# Patient Record
Sex: Male | Born: 1960
Health system: Southern US, Community
[De-identification: ages and names within clinical notes are randomized; demographics above are authoritative.]

## PROBLEM LIST (undated history)

## (undated) DIAGNOSIS — D649 Anemia, unspecified: Secondary | ICD-10-CM

## (undated) DIAGNOSIS — Z862 Personal history of diseases of the blood and blood-forming organs and certain disorders involving the immune mechanism: Secondary | ICD-10-CM

## (undated) DIAGNOSIS — G4700 Insomnia, unspecified: Secondary | ICD-10-CM

## (undated) DIAGNOSIS — M7540 Impingement syndrome of unspecified shoulder: Secondary | ICD-10-CM

## (undated) DIAGNOSIS — I2510 Atherosclerotic heart disease of native coronary artery without angina pectoris: Secondary | ICD-10-CM

## (undated) DIAGNOSIS — I639 Cerebral infarction, unspecified: Secondary | ICD-10-CM

## (undated) DIAGNOSIS — M51379 Other intervertebral disc degeneration, lumbosacral region without mention of lumbar back pain or lower extremity pain: Secondary | ICD-10-CM

## (undated) DIAGNOSIS — R011 Cardiac murmur, unspecified: Secondary | ICD-10-CM

## (undated) DIAGNOSIS — R160 Hepatomegaly, not elsewhere classified: Secondary | ICD-10-CM

## (undated) DIAGNOSIS — I358 Other nonrheumatic aortic valve disorders: Secondary | ICD-10-CM

## (undated) DIAGNOSIS — M5137 Other intervertebral disc degeneration, lumbosacral region: Secondary | ICD-10-CM

## (undated) DIAGNOSIS — E876 Hypokalemia: Secondary | ICD-10-CM

## (undated) DIAGNOSIS — C159 Malignant neoplasm of esophagus, unspecified: Secondary | ICD-10-CM

## (undated) DIAGNOSIS — R933 Abnormal findings on diagnostic imaging of other parts of digestive tract: Secondary | ICD-10-CM

## (undated) DIAGNOSIS — J189 Pneumonia, unspecified organism: Secondary | ICD-10-CM

## (undated) DIAGNOSIS — IMO0002 Reserved for concepts with insufficient information to code with codable children: Secondary | ICD-10-CM

## (undated) DIAGNOSIS — I10 Essential (primary) hypertension: Secondary | ICD-10-CM

## (undated) DIAGNOSIS — I469 Cardiac arrest, cause unspecified: Secondary | ICD-10-CM

## (undated) DIAGNOSIS — M25512 Pain in left shoulder: Secondary | ICD-10-CM

## (undated) DIAGNOSIS — L97529 Non-pressure chronic ulcer of other part of left foot with unspecified severity: Secondary | ICD-10-CM

## (undated) DIAGNOSIS — M545 Low back pain: Secondary | ICD-10-CM

## (undated) DIAGNOSIS — K219 Gastro-esophageal reflux disease without esophagitis: Secondary | ICD-10-CM

## (undated) DIAGNOSIS — M75100 Unspecified rotator cuff tear or rupture of unspecified shoulder, not specified as traumatic: Secondary | ICD-10-CM

## (undated) DIAGNOSIS — M329 Systemic lupus erythematosus, unspecified: Secondary | ICD-10-CM

## (undated) DIAGNOSIS — M069 Rheumatoid arthritis, unspecified: Secondary | ICD-10-CM

## (undated) DIAGNOSIS — I2699 Other pulmonary embolism without acute cor pulmonale: Secondary | ICD-10-CM

## (undated) DIAGNOSIS — I739 Peripheral vascular disease, unspecified: Secondary | ICD-10-CM

## (undated) HISTORY — DX: Non-pressure chronic ulcer of other part of left foot with unspecified severity: L97.529

## (undated) HISTORY — DX: Rheumatoid arthritis, unspecified: M06.9

## (undated) HISTORY — DX: Hepatomegaly, not elsewhere classified: R16.0

## (undated) HISTORY — DX: Reserved for concepts with insufficient information to code with codable children: IMO0002

## (undated) HISTORY — PX: ACHILLES TENDON SURGERY: SHX542

## (undated) HISTORY — DX: Abnormal findings on diagnostic imaging of other parts of digestive tract: R93.3

## (undated) HISTORY — DX: Systemic lupus erythematosus, unspecified: M32.9

---
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
## 2000-01-13 ENCOUNTER — Emergency Department (HOSPITAL_COMMUNITY): Admission: EM | Admit: 2000-01-13 | Discharge: 2000-01-13 | Payer: Self-pay | Admitting: Emergency Medicine

## 2000-01-13 ENCOUNTER — Encounter: Payer: Self-pay | Admitting: Emergency Medicine

## 2000-01-19 ENCOUNTER — Emergency Department (HOSPITAL_COMMUNITY): Admission: EM | Admit: 2000-01-19 | Discharge: 2000-01-19 | Payer: Self-pay | Admitting: Emergency Medicine

## 2001-05-04 ENCOUNTER — Encounter: Payer: Self-pay | Admitting: Emergency Medicine

## 2001-05-04 ENCOUNTER — Emergency Department (HOSPITAL_COMMUNITY): Admission: AC | Admit: 2001-05-04 | Discharge: 2001-05-04 | Payer: Self-pay

## 2001-08-06 ENCOUNTER — Emergency Department (HOSPITAL_COMMUNITY): Admission: EM | Admit: 2001-08-06 | Discharge: 2001-08-06 | Payer: Self-pay | Admitting: Emergency Medicine

## 2001-10-02 ENCOUNTER — Emergency Department (HOSPITAL_COMMUNITY): Admission: EM | Admit: 2001-10-02 | Discharge: 2001-10-02 | Payer: Self-pay | Admitting: *Deleted

## 2001-10-02 ENCOUNTER — Encounter: Payer: Self-pay | Admitting: *Deleted

## 2001-12-25 ENCOUNTER — Emergency Department (HOSPITAL_COMMUNITY): Admission: EM | Admit: 2001-12-25 | Discharge: 2001-12-25 | Payer: Self-pay | Admitting: Emergency Medicine

## 2002-04-07 ENCOUNTER — Encounter: Payer: Self-pay | Admitting: Emergency Medicine

## 2002-04-07 ENCOUNTER — Emergency Department (HOSPITAL_COMMUNITY): Admission: EM | Admit: 2002-04-07 | Discharge: 2002-04-07 | Payer: Self-pay | Admitting: Emergency Medicine

## 2002-07-18 ENCOUNTER — Encounter: Payer: Self-pay | Admitting: Orthopedic Surgery

## 2002-07-22 ENCOUNTER — Encounter: Payer: Self-pay | Admitting: Orthopedic Surgery

## 2002-07-22 ENCOUNTER — Inpatient Hospital Stay (HOSPITAL_COMMUNITY): Admission: RE | Admit: 2002-07-22 | Discharge: 2002-07-23 | Payer: Self-pay | Admitting: Orthopedic Surgery

## 2002-07-22 HISTORY — PX: OSTEOTOMY AND ULNAR SHORTENING: SHX2140

## 2003-09-16 ENCOUNTER — Ambulatory Visit (HOSPITAL_COMMUNITY): Admission: RE | Admit: 2003-09-16 | Discharge: 2003-09-16 | Payer: Self-pay | Admitting: Orthopedic Surgery

## 2003-09-17 ENCOUNTER — Encounter: Admission: RE | Admit: 2003-09-17 | Discharge: 2003-09-17 | Payer: Self-pay | Admitting: Orthopedic Surgery

## 2004-09-14 ENCOUNTER — Emergency Department (HOSPITAL_COMMUNITY): Admission: EM | Admit: 2004-09-14 | Discharge: 2004-09-14 | Payer: Self-pay | Admitting: Emergency Medicine

## 2004-09-18 ENCOUNTER — Encounter: Admission: RE | Admit: 2004-09-18 | Discharge: 2004-09-18 | Payer: Self-pay | Admitting: Orthopaedic Surgery

## 2004-10-20 ENCOUNTER — Emergency Department (HOSPITAL_COMMUNITY): Admission: EM | Admit: 2004-10-20 | Discharge: 2004-10-20 | Payer: Self-pay | Admitting: Emergency Medicine

## 2004-10-25 ENCOUNTER — Emergency Department (HOSPITAL_COMMUNITY): Admission: EM | Admit: 2004-10-25 | Discharge: 2004-10-25 | Payer: Self-pay | Admitting: Emergency Medicine

## 2005-01-18 ENCOUNTER — Emergency Department (HOSPITAL_COMMUNITY): Admission: EM | Admit: 2005-01-18 | Discharge: 2005-01-18 | Payer: Self-pay | Admitting: Emergency Medicine

## 2006-09-13 ENCOUNTER — Emergency Department (HOSPITAL_COMMUNITY): Admission: EM | Admit: 2006-09-13 | Discharge: 2006-09-13 | Payer: Self-pay | Admitting: Emergency Medicine

## 2007-02-24 ENCOUNTER — Emergency Department (HOSPITAL_COMMUNITY): Admission: EM | Admit: 2007-02-24 | Discharge: 2007-02-24 | Payer: Self-pay | Admitting: Emergency Medicine

## 2007-06-27 ENCOUNTER — Emergency Department (HOSPITAL_COMMUNITY): Admission: EM | Admit: 2007-06-27 | Discharge: 2007-06-27 | Payer: Self-pay | Admitting: Emergency Medicine

## 2007-12-18 ENCOUNTER — Emergency Department (HOSPITAL_COMMUNITY): Admission: EM | Admit: 2007-12-18 | Discharge: 2007-12-18 | Payer: Self-pay | Admitting: Emergency Medicine

## 2008-01-27 ENCOUNTER — Emergency Department (HOSPITAL_COMMUNITY): Admission: EM | Admit: 2008-01-27 | Discharge: 2008-01-27 | Payer: Self-pay | Admitting: Emergency Medicine

## 2009-07-24 ENCOUNTER — Ambulatory Visit: Payer: Self-pay | Admitting: Internal Medicine

## 2009-07-24 ENCOUNTER — Inpatient Hospital Stay (HOSPITAL_COMMUNITY): Admission: EM | Admit: 2009-07-24 | Discharge: 2009-07-26 | Payer: Self-pay | Admitting: Emergency Medicine

## 2009-07-26 ENCOUNTER — Ambulatory Visit: Payer: Self-pay | Admitting: Vascular Surgery

## 2009-07-26 ENCOUNTER — Encounter: Payer: Self-pay | Admitting: Internal Medicine

## 2009-08-11 ENCOUNTER — Encounter (INDEPENDENT_AMBULATORY_CARE_PROVIDER_SITE_OTHER): Payer: Self-pay | Admitting: Internal Medicine

## 2009-08-11 ENCOUNTER — Ambulatory Visit: Payer: Self-pay | Admitting: Internal Medicine

## 2009-08-11 ENCOUNTER — Encounter: Payer: Self-pay | Admitting: Internal Medicine

## 2009-08-11 DIAGNOSIS — M069 Rheumatoid arthritis, unspecified: Secondary | ICD-10-CM

## 2009-09-23 ENCOUNTER — Emergency Department (HOSPITAL_COMMUNITY): Admission: EM | Admit: 2009-09-23 | Discharge: 2009-09-23 | Payer: Self-pay | Admitting: Emergency Medicine

## 2009-09-27 ENCOUNTER — Telehealth: Payer: Self-pay | Admitting: Internal Medicine

## 2009-10-08 ENCOUNTER — Ambulatory Visit: Payer: Self-pay | Admitting: Internal Medicine

## 2009-10-08 DIAGNOSIS — L84 Corns and callosities: Secondary | ICD-10-CM

## 2009-10-08 DIAGNOSIS — E785 Hyperlipidemia, unspecified: Secondary | ICD-10-CM

## 2009-10-08 LAB — CONVERTED CEMR LAB
ALT: 33 units/L (ref 0–53)
AST: 13 units/L (ref 0–37)
Albumin: 4.3 g/dL (ref 3.5–5.2)
Alkaline Phosphatase: 77 units/L (ref 39–117)
Bilirubin, Direct: 0.1 mg/dL (ref 0.0–0.3)
Cholesterol: 165 mg/dL (ref 0–200)
HDL: 52 mg/dL (ref >39)
Indirect Bilirubin: 0.4 mg/dL (ref 0.0–0.9)
LDL Cholesterol: 86 mg/dL (ref 0–99)
Total Bilirubin: 0.5 mg/dL (ref 0.3–1.2)
Total CHOL/HDL Ratio: 3.2
Total Protein: 6.6 g/dL (ref 6.0–8.3)
Triglycerides: 134 mg/dL (ref <150)
VLDL: 27 mg/dL (ref 0–40)

## 2009-10-18 ENCOUNTER — Ambulatory Visit: Payer: Self-pay | Admitting: Internal Medicine

## 2009-10-18 ENCOUNTER — Ambulatory Visit (HOSPITAL_COMMUNITY): Admission: RE | Admit: 2009-10-18 | Discharge: 2009-10-18 | Payer: Self-pay | Admitting: Internal Medicine

## 2009-10-18 LAB — CONVERTED CEMR LAB
CRP: 2.3 mg/dL — ABNORMAL HIGH (ref <0.6)
Sed Rate: 24 mm/hr — ABNORMAL HIGH (ref 0–16)

## 2009-10-29 ENCOUNTER — Ambulatory Visit: Payer: Self-pay | Admitting: Internal Medicine

## 2009-10-29 DIAGNOSIS — M545 Low back pain, unspecified: Secondary | ICD-10-CM

## 2009-10-29 HISTORY — DX: Low back pain, unspecified: M54.50

## 2009-11-02 ENCOUNTER — Encounter: Payer: Self-pay | Admitting: Internal Medicine

## 2009-11-17 ENCOUNTER — Ambulatory Visit: Payer: Self-pay | Admitting: Internal Medicine

## 2009-11-25 ENCOUNTER — Telehealth: Payer: Self-pay | Admitting: Internal Medicine

## 2009-11-26 ENCOUNTER — Ambulatory Visit: Payer: Self-pay | Admitting: Internal Medicine

## 2009-11-26 ENCOUNTER — Encounter: Payer: Self-pay | Admitting: Internal Medicine

## 2009-12-24 ENCOUNTER — Encounter: Payer: Self-pay | Admitting: Internal Medicine

## 2009-12-24 ENCOUNTER — Telehealth: Payer: Self-pay | Admitting: Internal Medicine

## 2010-01-11 ENCOUNTER — Ambulatory Visit: Payer: Self-pay | Admitting: Internal Medicine

## 2010-01-21 ENCOUNTER — Telehealth: Payer: Self-pay | Admitting: Internal Medicine

## 2010-01-25 ENCOUNTER — Encounter: Payer: Self-pay | Admitting: Internal Medicine

## 2010-02-02 ENCOUNTER — Encounter: Payer: Self-pay | Admitting: Internal Medicine

## 2010-02-15 ENCOUNTER — Telehealth: Payer: Self-pay | Admitting: *Deleted

## 2010-02-15 ENCOUNTER — Emergency Department (HOSPITAL_COMMUNITY): Admission: EM | Admit: 2010-02-15 | Discharge: 2010-02-15 | Payer: Self-pay | Admitting: Emergency Medicine

## 2010-02-16 ENCOUNTER — Ambulatory Visit: Payer: Self-pay | Admitting: Internal Medicine

## 2010-02-16 DIAGNOSIS — R030 Elevated blood-pressure reading, without diagnosis of hypertension: Secondary | ICD-10-CM

## 2010-02-22 ENCOUNTER — Ambulatory Visit: Payer: Self-pay | Admitting: Internal Medicine

## 2010-02-22 ENCOUNTER — Encounter: Payer: Self-pay | Admitting: Internal Medicine

## 2010-02-22 ENCOUNTER — Telehealth: Payer: Self-pay | Admitting: Internal Medicine

## 2010-02-22 DIAGNOSIS — G894 Chronic pain syndrome: Secondary | ICD-10-CM | POA: Insufficient documentation

## 2010-03-01 ENCOUNTER — Encounter: Payer: Self-pay | Admitting: Internal Medicine

## 2010-03-02 LAB — CONVERTED CEMR LAB
Amphetamine Screen, Ur: NEGATIVE
Barbiturate Quant, Ur: NEGATIVE
Benzodiazepines.: POSITIVE — AB
Cocaine Metabolites: NEGATIVE
Creatinine,U: 139.7 mg/dL
Marijuana Metabolite: NEGATIVE
Methadone: NEGATIVE
Opiates: POSITIVE — AB
Phencyclidine (PCP): NEGATIVE
Propoxyphene: NEGATIVE

## 2010-03-24 ENCOUNTER — Telehealth: Payer: Self-pay | Admitting: *Deleted

## 2010-03-25 ENCOUNTER — Encounter: Payer: Self-pay | Admitting: Internal Medicine

## 2010-04-04 ENCOUNTER — Ambulatory Visit: Payer: Self-pay | Admitting: Internal Medicine

## 2010-04-04 ENCOUNTER — Encounter: Payer: Self-pay | Admitting: Internal Medicine

## 2010-04-04 DIAGNOSIS — R740 Nonspecific elevation of levels of transaminase and lactic acid dehydrogenase [LDH]: Secondary | ICD-10-CM

## 2010-04-04 DIAGNOSIS — Z8639 Personal history of other endocrine, nutritional and metabolic disease: Secondary | ICD-10-CM

## 2010-04-04 DIAGNOSIS — Z862 Personal history of diseases of the blood and blood-forming organs and certain disorders involving the immune mechanism: Secondary | ICD-10-CM | POA: Insufficient documentation

## 2010-04-04 LAB — CONVERTED CEMR LAB
ALT: 193 units/L — ABNORMAL HIGH (ref 0–53)
AST: 74 units/L — ABNORMAL HIGH (ref 0–37)
Albumin: 4.6 g/dL (ref 3.5–5.2)
Alkaline Phosphatase: 214 units/L — ABNORMAL HIGH (ref 39–117)
BUN: 12 mg/dL (ref 6–23)
CO2: 26 meq/L (ref 19–32)
Calcium: 10.2 mg/dL (ref 8.4–10.5)
Chloride: 106 meq/L (ref 96–112)
Creatinine, Ser: 0.98 mg/dL (ref 0.40–1.50)
Glucose, Bld: 109 mg/dL — ABNORMAL HIGH (ref 70–99)
HCV Ab: NEGATIVE
Hep A IgM: NEGATIVE
Hep B C IgM: NEGATIVE
Hepatitis B Surface Ag: NEGATIVE
Potassium: 5.1 meq/L (ref 3.5–5.3)
Sodium: 141 meq/L (ref 135–145)
Total Bilirubin: 0.4 mg/dL (ref 0.3–1.2)
Total Protein: 7.6 g/dL (ref 6.0–8.3)

## 2010-04-05 ENCOUNTER — Emergency Department (HOSPITAL_COMMUNITY): Admission: EM | Admit: 2010-04-05 | Discharge: 2010-04-05 | Payer: Self-pay | Admitting: Emergency Medicine

## 2010-04-09 ENCOUNTER — Emergency Department (HOSPITAL_COMMUNITY): Admission: EM | Admit: 2010-04-09 | Discharge: 2010-04-09 | Payer: Self-pay | Admitting: Emergency Medicine

## 2010-04-15 ENCOUNTER — Telehealth: Payer: Self-pay | Admitting: Internal Medicine

## 2010-04-19 ENCOUNTER — Ambulatory Visit: Payer: Self-pay | Admitting: Internal Medicine

## 2010-04-20 ENCOUNTER — Telehealth: Payer: Self-pay | Admitting: Internal Medicine

## 2010-04-20 ENCOUNTER — Encounter: Payer: Self-pay | Admitting: Internal Medicine

## 2010-04-20 LAB — CONVERTED CEMR LAB
ALT: 41 units/L (ref 0–53)
AST: 26 units/L (ref 0–37)
Albumin: 4.1 g/dL (ref 3.5–5.2)
Alkaline Phosphatase: 153 units/L — ABNORMAL HIGH (ref 39–117)
BUN: 7 mg/dL (ref 6–23)
CO2: 22 meq/L (ref 19–32)
Calcium: 9.5 mg/dL (ref 8.4–10.5)
Chloride: 110 meq/L (ref 96–112)
Creatinine, Ser: 0.77 mg/dL (ref 0.40–1.50)
Glucose, Bld: 75 mg/dL (ref 70–99)
Potassium: 3.8 meq/L (ref 3.5–5.3)
Sodium: 142 meq/L (ref 135–145)
Total Bilirubin: 0.3 mg/dL (ref 0.3–1.2)
Total Protein: 6.9 g/dL (ref 6.0–8.3)

## 2010-04-21 ENCOUNTER — Telehealth: Payer: Self-pay | Admitting: Internal Medicine

## 2010-04-22 ENCOUNTER — Telehealth: Payer: Self-pay | Admitting: *Deleted

## 2010-04-25 ENCOUNTER — Telehealth: Payer: Self-pay | Admitting: Internal Medicine

## 2010-05-22 ENCOUNTER — Emergency Department (HOSPITAL_COMMUNITY): Admission: EM | Admit: 2010-05-22 | Discharge: 2010-05-23 | Payer: Self-pay | Admitting: Emergency Medicine

## 2010-05-27 ENCOUNTER — Telehealth: Payer: Self-pay | Admitting: Internal Medicine

## 2010-05-30 ENCOUNTER — Encounter: Payer: Self-pay | Admitting: Internal Medicine

## 2010-06-19 ENCOUNTER — Emergency Department (HOSPITAL_COMMUNITY)
Admission: EM | Admit: 2010-06-19 | Discharge: 2010-06-19 | Payer: Self-pay | Source: Home / Self Care | Admitting: Emergency Medicine

## 2010-06-28 ENCOUNTER — Telehealth: Payer: Self-pay | Admitting: Internal Medicine

## 2010-06-30 ENCOUNTER — Encounter: Payer: Self-pay | Admitting: Internal Medicine

## 2010-07-01 ENCOUNTER — Encounter: Payer: Self-pay | Admitting: Internal Medicine

## 2010-07-17 ENCOUNTER — Encounter: Payer: Self-pay | Admitting: Internal Medicine

## 2010-07-20 ENCOUNTER — Ambulatory Visit: Admission: RE | Admit: 2010-07-20 | Discharge: 2010-07-20 | Payer: Self-pay | Source: Home / Self Care

## 2010-07-28 NOTE — Progress Notes (Signed)
Summary: phone/gg  Phone Note Call from Patient   Caller: Patient Summary of Call: Pt called with c/o left shoulder pain. He can't get any relief with percocet and prendisone.  He can't sleep, did not like the sleeping meds he has.  He has had this pain for years.  THe more he uses the shoulder at work the worse the pain.  Increase pain in last 6 months.  Appointment tomorrow Initial call taken by: Merrie Roof RN,  February 15, 2010 5:22 PM

## 2010-07-28 NOTE — Progress Notes (Signed)
Summary: refill/gg  Phone Note Refill Request  on December 24, 2009 10:31 AM  Refills Requested: Medication #1:  OXYCODONE HCL 10 MG TABS take 1 tablet by mouth every 6 hours as needed for pain   Last Refilled: 11/26/2009 call when ready @ [phone]   Method Requested: Pick up at Office Initial call taken by: Merrie Roof RN,  December 24, 2009 10:32 AM  Follow-up for Phone Call        Approved, pt has not violated the contract, he is eligible for 120 tablets per month and his last refill was on June 3rd, 2011 I am in [facility] hospital today so can we please have [facility] attending sign the perscription for this patient Thank you Breaunna Gottlieb Follow-up by: Mliss Sax MD,  December 24, 2009 11:08 AM  Additional Follow-up for Phone Call Additional follow up Details #1::        Pt informed Rx is ready Additional Follow-up by: Merrie Roof RN,  December 24, 2009 12:02 PM    Prescriptions: OXYCODONE HCL 10 MG TABS (OXYCODONE HCL) take 1 tablet by mouth every 6 hours as needed for pain  #120 x 0   Entered by:   Zoila Shutter MD   Authorized by:   Mliss Sax MD   Signed by:   Zoila Shutter MD on 12/24/2009   Method used:   Print then Give to Patient   RxID:   [id number] OXYCODONE HCL 10 MG TABS (OXYCODONE HCL) take 1 tablet by mouth every 6 hours as needed for pain  #120 x 0   Entered and Authorized by:   Mliss Sax MD   Signed by:   Mliss Sax MD on 12/24/2009   Method used:   Historical   RxID:   [id number]

## 2010-07-28 NOTE — Assessment & Plan Note (Signed)
Summary: L shoulder, states reaction to shot/pcp-magick/hla   Vital Signs:  Patient profile:   50 year old male Height:      71 inches Weight:      167.4 pounds BMI:     23.43 Temp:     98.2 degrees F oral Pulse rate:   104 / minute BP sitting:   125 / 85  (right arm)  Vitals Entered By: Filomena Jungling NT II (February 22, 2010 3:16 PM) CC: FOLLOWUP VISIT FROM SHOULDER INJECTION, WOULD LIKE TO GET PAIN MEDICINE TODAY ?EARLY Is Patient Diabetic? No Pain Assessment Patient in pain? yes     Location: shoulders,left Intensity: 7 Type: aching Onset of pain  Chronic Nutritional Status BMI of 19 -24 = normal  Have you ever been in a relationship where you felt threatened, hurt or afraid?No   Does patient need assistance? Functional Status Self care Ambulation Normal   CC:  FOLLOWUP VISIT FROM SHOULDER INJECTION and WOULD LIKE TO GET PAIN MEDICINE TODAY ?EARLY.  History of Present Illness: Patient is a 50 y/o man with h/o chronic pain related to his left shoulder and back.  He was last seen in the clinic last week and received a cortisone injection in his shoulder.  He was supposed to get MRI of the shoulder yesterday, but didn't go because he thought he was getting sick.  He rescheduled for this Thursday.  Patient noticed that about 7-8 hours after the injection last week he got sweaty and hot (changing shirt every 4-5 hours) continuously until yesterday.  Never had this problem before.  Also, since having the shot he has had increased pain so has used 2 extra percocet a day.  He states that he woke up 3 nights ago with severe left shoulder and left chest pain " like it was frozen up" - never had this before.   Patient has taken 2 extra oxycodone a day since getting the shot. At this point he is set to run out 2 days early.    Pt is a newly-former smoker - he quit smoking in the last 11-12 days - was smoking 1.5-2 ppd.    No fevers, rhinorrhea, cough, abdominal pain, change in bowels. No  weight loss.    Preventive Screening-Counseling & Management  Alcohol-Tobacco     Alcohol drinks/day: 0     Smoking Status: quit     Smoking Cessation Counseling: yes     Packs/Day: 2.0     Pack years: 4-6 A DAY  Caffeine-Diet-Exercise     Does Patient Exercise: no  Current Problems (verified): 1)  Chronic Pain Syndrome  (ICD-338.4) 2)  Elevated Bp Reading Without Dx Hypertension  (ICD-796.2) 3)  Shoulder Pain, Left, Chronic  (ICD-719.41) 4)  Back Pain  (ICD-724.5) 5)  Steroid Use, Long Term  (ICD-V58.65) 6)  Callus, Foot  (ICD-700) 7)  Hyperlipidemia  (ICD-272.4) 8)  ? of Vertigo  (ICD-780.4) 9)  Tobacco Abuse  (ICD-305.1) 10)  ? of Tia  (ICD-435.9) 11)  Rheumatoid Arthritis  (ICD-714.0) 12)  Systemic Lupus Erythematosus  (ICD-710.0)  Current Medications (verified): 1)  Humira 40 Mg/0.79ml Kit (Adalimumab) .... Inject 40 Mg Subcutaneously Every Other Week On Monday 2)  Methotrexate 2.5 Mg Tabs (Methotrexate Sodium) .... Take Ten Tablets By Mouth Every Monday. 3)  Prednisone 20 Mg Tabs (Prednisone) .... Take 1 Tablet By Mouth Once A Day 4)  Aspirin 81 Mg Tbec (Aspirin) .... Take 1 Tablet By Mouth Once A Day 5)  Zocor 20 Mg  Tabs (Simvastatin) .... Take 1 Tablet Once Daily At Bedtime 6)  Oscal 500/200 D-3 500-200 Mg-Unit Tabs (Calcium-Vitamin D) .... Take 1 Tablet Twice Daily 7)  Morphine Sulfate Cr 15 Mg Xr12h-Tab (Morphine Sulfate) .... Take 1 Tablet By Mouth Twice Daily 8)  Oxycodone-Acetaminophen 5-325 Mg Tabs (Oxycodone-Acetaminophen) .... Take 1 Tablet By Mouth Every 6 Hours As Needed For Severe Pain  Allergies (verified): No Known Drug Allergies  Past History:  Family History: Last updated: 08/11/2009 no significant medical family history  Social History: Last updated: 11/17/2009 Single Alcohol use-no Drug use-no Regular exercise-yes  Risk Factors: Alcohol Use: 0 (02/22/2010) Exercise: no (02/22/2010)  Risk Factors: Smoking Status: quit  (02/22/2010) Packs/Day: 2.0 (02/22/2010)  Past medical history reviewed for relevance to current acute and chronic problems.  Past Medical History: Reviewed history from 11/26/2009 and no changes required. Lupus RA chronic pain  Review of Systems       see HPI  Physical Exam  General:  NAD Lungs:  normal respiratory effort, normal breath sounds, no crackles, and no wheezes.   Heart:  normal rate, regular rhythm, and no murmur.   Abdomen:  soft, non-tender, and normal bowel sounds.   Msk:  full ROM of both shoulders.  left shoulder:  moderately tender to palpation AC joint and lateral border of scapula.  No joint swelling, warmth or redness. Extremities:  no edema Neurologic:  A+Ox3, CN grossly intact, gait wnl.  full, equal strength bilateral upper extremities Psych:  Oriented X3, memory intact for recent and remote, normally interactive, good eye contact, not anxious appearing, and not depressed appearing.     Impression & Recommendations:  Problem # 1:  CHRONIC PAIN SYNDROME (ICD-338.4) Assessment New Patient with chronic pain syndrome with pain in his left shoulder, low back and hips.  Given that he has had this for a long time, we will switch his regimen to a long acting narcotic with breakthrough.  MS Contin 15mg  PO BID #60 and percocet 5/325 PO q6h prn severe pain #120 to be filled starting today.  We had the patient sign a new drug contact and we explained the terms of the contract. Pt voiced understanding.  Also UDS w/ oxycodone level today.   Orders: T-Drug Screen-Urine, (single) [id number]) T- * Misc. Laboratory test [id number])  Problem # 2:  SHOULDER PAIN, LEFT, CHRONIC (ICD-719.41) Assessment: Unchanged Patient is scheduled for MRI in 2 days.  No evidence of tendon rupture or joint infection on exam - pt has full range of motion of his shoulder.  Will continue wth scheduled MRI. At this time do not have good explanation for patient's sweating episodes and increased  pain after the joint injection, but this appears to have resolved.  See problem #1 for pain regimen. The following medications were removed from the medication list:    Oxycodone Hcl 10 Mg Tabs (Oxycodone hcl) .[name]... Take 1 tablet by mouth every 6 hours as needed for pain His updated medication list for this problem includes:    Aspirin 81 Mg Tbec (Aspirin) .[name]... Take 1 tablet by mouth once a day    Morphine Sulfate Cr 15 Mg Xr12h-tab (Morphine sulfate) .[name]... Take 1 tablet by mouth twice daily    Oxycodone-acetaminophen 5-325 Mg Tabs (Oxycodone-acetaminophen) .[name]... Take 1 tablet by mouth every 6 hours as needed for severe pain  Complete Medication List: 1)  Humira 40 Mg/0.54ml Kit (Adalimumab) .... Inject 40 mg subcutaneously every other week on monday 2)  Methotrexate 2.5 Mg Tabs (Methotrexate sodium) .Marland KitchenMarland KitchenMarland Kitchen  Take ten tablets by mouth every monday. 3)  Prednisone 20 Mg Tabs (Prednisone) .... Take 1 tablet by mouth once a day 4)  Aspirin 81 Mg Tbec (Aspirin) .... Take 1 tablet by mouth once a day 5)  Zocor 20 Mg Tabs (Simvastatin) .... Take 1 tablet once daily at bedtime 6)  Oscal 500/200 D-3 500-200 Mg-unit Tabs (Calcium-vitamin d) .... Take 1 tablet twice daily 7)  Morphine Sulfate Cr 15 Mg Xr12h-tab (Morphine sulfate) .... Take 1 tablet by mouth twice daily 8)  Oxycodone-acetaminophen 5-325 Mg Tabs (Oxycodone-acetaminophen) .... Take 1 tablet by mouth every 6 hours as needed for severe pain  Patient Instructions: 1)  You have been given prescriptions for 2 new pain medicines.  One is a long-acting medcine called MS-Contin.  The other is for breakthrough pain. 2)  Please take all of your medicines as directed. 3)  Please keep your regularly scheduled appointment with Dr. Aldine Contes Prescriptions: ZOCOR 20 MG TABS (SIMVASTATIN) take 1 tablet once daily at bedtime  #30 x 5   Entered and Authorized by:   Danelle Berry, MD   Signed by:   Danelle Berry, MD on 02/22/2010   Method used:   Print then  Give to Patient   RxID:   [id number] OXYCODONE-ACETAMINOPHEN 5-325 MG TABS (OXYCODONE-ACETAMINOPHEN) Take 1 tablet by mouth every 6 hours as needed for severe pain  #120 x 0   Entered and Authorized by:   Danelle Berry, MD   Signed by:   Danelle Berry, MD on 02/22/2010   Method used:   Print then Give to Patient   RxID:   [id number] MORPHINE SULFATE CR 15 MG XR12H-TAB (MORPHINE SULFATE) Take 1 tablet by mouth twice daily  #60 x 0   Entered and Authorized by:   Danelle Berry, MD   Signed by:   Danelle Berry, MD on 02/22/2010   Method used:   Print then Give to Patient   RxID:   [id number]   Prevention & Chronic Care Immunizations   Influenza vaccine: Not documented   Influenza vaccine deferral: Not indicated  (10/08/2009)    Tetanus booster: 11/17/2009: Td   Td booster deferral: Not indicated  (10/08/2009)    Pneumococcal vaccine: Not documented  Other Screening   Smoking status: quit  (02/22/2010)  Lipids   Total Cholesterol: 165  (10/08/2009)   LDL: 86  (10/08/2009)   LDL Direct: Not documented   HDL: 52  (10/08/2009)   Triglycerides: 134  (10/08/2009)    SGOT (AST): 13  (10/08/2009)   BMP action: Ordered   SGPT (ALT): 33  (10/08/2009)   Alkaline phosphatase: 77  (10/08/2009)   Total bilirubin: 0.5  (10/08/2009)  Self-Management Support :   Personal Goals (by the next clinic visit) :      Personal LDL goal: 100  (10/18/2009)    Patient will work on the following items until the next clinic visit to reach self-care goals:     Medications and monitoring: take my medicines every day  (02/22/2010)     Eating: drink diet soda or water instead of juice or soda, eat more vegetables, use fresh or frozen vegetables, eat foods that are low in salt, eat baked foods instead of fried foods  (02/22/2010)     Activity: take a 30 minute walk every day  (02/22/2010)    Lipid self-management support: Education handout  (02/22/2010)     Lipid education handout  printed   Process Orders Check Orders Results:     [facility]: Order checked:     [id number] -- T- * Misc. Laboratory test -- No ABN rules found (CPT: ) Tests Sent for requisitioning (February 22, 2010 5:01 PM):     02/22/2010: [facility] -- T-Drug Screen-Urine, (single) [[id number]] (signed)     02/22/2010: [facility] -- T- * Misc. Laboratory test [phone] (signed)     Process Orders Check Orders Results:     [facility]: Order checked:     [id number] -- T- * Misc. Laboratory test -- No ABN rules found (CPT: ) Tests Sent for requisitioning (February 22, 2010 5:01 PM):     02/22/2010: [facility] -- T-Drug Screen-Urine, (single) [[id number]] (signed)     02/22/2010: [facility] -- T- * Misc. Laboratory test [phone] (signed)    Appended Document: L shoulder, states reaction to shot/pcp-magick/hla Mr. Bialas was seen and examined with Dr. Claudette Laws.  The assessment and plan were formulated together.  My left shoulder examination was also unremarkable for brusing, erythema, warmth, or weakness.  I agree with the change in his regimen to one that is more geared to management of chronic pain.  Hence, we will start MS Contin.  Will also decrease the oxycodone to 5 mg and add back the acetaminophen.  Will follow-up on the UDS result.

## 2010-07-28 NOTE — Miscellaneous (Signed)
Summary: MEDICATION CONTRACT  MEDICATION CONTRACT   Imported By: Louretta Parma 03/02/2010 16:50:01  _____________________________________________________________________  External Attachment:    Type:   Image     Comment:   External Document

## 2010-07-28 NOTE — Assessment & Plan Note (Signed)
Summary: est-ck/fu/meds/cfb   Vital Signs:  Patient profile:   50 year old male Height:      71 inches (180.34 cm) Weight:      163.2 pounds (74.18 kg) BMI:     22.84 Temp:     97.1 degrees F (36.17 degrees C) oral Pulse rate:   98 / minute BP sitting:   120 / 83  (right arm)  Vitals Entered By: Stanton Kidney Ditzler RN (January 11, 2010 4:24 PM) Is Patient Diabetic? No Pain Assessment Patient in pain? yes     Location: left shoulder Intensity: 6-7 Type: sharp Onset of pain  long time Nutritional Status BMI of 19 -24 = normal Nutritional Status Detail appetite good  Have you ever been in a relationship where you felt threatened, hurt or afraid?denies   Does patient need assistance? Functional Status Self care Ambulation Normal Comments Throat swollen last week - ? pain med. Discuss pain med.   History of Present Illness: 50 yo male with PMH outlined below presents to [facility] for regular follow up appointment. He has no concerns at the time. No recent sicknesses or hospitalizaitons. No episodes of chest pain, SOB, palpitations, no fever or chills. No specific abdominal or urinary concerns. No recent changes in appetite, weight, sleep patterns, mood.    Depression History:      The patient denies a depressed mood most of the day and a diminished interest in his usual daily activities.  The patient denies significant weight loss, significant weight gain, insomnia, hypersomnia, psychomotor agitation, psychomotor retardation, fatigue (loss of energy), feelings of worthlessness (guilt), impaired concentration (indecisiveness), and recurrent thoughts of death or suicide.        The patient denies that he feels like life is not worth living, denies that he wishes that he were dead, and denies that he has thought about ending his life.         Preventive Screening-Counseling & Management  Alcohol-Tobacco     Alcohol drinks/day: 0     Smoking Status: quit     Smoking Cessation Counseling:  yes     Packs/Day: 2.0     Pack years: 4-6 A DAY  Caffeine-Diet-Exercise     Does Patient Exercise: no  Problems Prior to Update: 1)  Back Pain  (ICD-724.5) 2)  Steroid Use, Long Term  (ICD-V58.65) 3)  Callus, Foot  (ICD-700) 4)  Hyperlipidemia  (ICD-272.4) 5)  ? of Vertigo  (ICD-780.4) 6)  Tobacco Abuse  (ICD-305.1) 7)  ? of Tia  (ICD-435.9) 8)  Rheumatoid Arthritis  (ICD-714.0) 9)  Systemic Lupus Erythematosus  (ICD-710.0)  Medications Prior to Update: 1)  Humira 40 Mg/0.62ml Kit (Adalimumab) .... Inject 40 Mg Subcutaneously Every Other Week On Monday 2)  Oxycodone Hcl 10 Mg Tabs (Oxycodone Hcl) .... Take 1 Tablet By Mouth Every 6 Hours As Needed For Pain 3)  Methotrexate 2.5 Mg Tabs (Methotrexate Sodium) .... Take Ten Tablets By Mouth Every Monday. 4)  Prednisone 20 Mg Tabs (Prednisone) .... Take 1 Tablet By Mouth Once A Day 5)  Meclizine Hcl 25 Mg Tabs (Meclizine Hcl) .... Take 1 Tablet By Mouth Every 6 Hours As Needed For Dizziness 6)  Aspirin 81 Mg Tbec (Aspirin) .... Take 1 Tablet By Mouth Once A Day 7)  Zocor 20 Mg Tabs (Simvastatin) .... Take 1 Tablet Once Daily At Bedtime 8)  Oscal 500/200 D-3 500-200 Mg-Unit Tabs (Calcium-Vitamin D) .... Take 1 Tablet Twice Daily 9)  Prednisone 20 Mg Tabs (Prednisone) .... Take  80 Mg Tab Once Daily For 4 Days, Then 60mg  Tab For 4 Days, Then 40 Mg Tab For 4 Days, Then 20mg  Once Daily 10)  Ambien 10 Mg Tabs (Zolpidem Tartrate) .... Take 1 Tab By Mouth At Bedtime  Current Medications (verified): 1)  Humira 40 Mg/0.14ml Kit (Adalimumab) .... Inject 40 Mg Subcutaneously Every Other Week On Monday 2)  Oxycodone Hcl 10 Mg Tabs (Oxycodone Hcl) .... Take 1 Tablet By Mouth Every 6 Hours As Needed For Pain 3)  Methotrexate 2.5 Mg Tabs (Methotrexate Sodium) .... Take Ten Tablets By Mouth Every Monday. 4)  Prednisone 20 Mg Tabs (Prednisone) .... Take 1 Tablet By Mouth Once A Day 5)  Meclizine Hcl 25 Mg Tabs (Meclizine Hcl) .... Take 1 Tablet By Mouth  Every 6 Hours As Needed For Dizziness 6)  Aspirin 81 Mg Tbec (Aspirin) .... Take 1 Tablet By Mouth Once A Day 7)  Zocor 20 Mg Tabs (Simvastatin) .... Take 1 Tablet Once Daily At Bedtime 8)  Oscal 500/200 D-3 500-200 Mg-Unit Tabs (Calcium-Vitamin D) .... Take 1 Tablet Twice Daily 9)  Prednisone 20 Mg Tabs (Prednisone) .... Take 80 Mg Tab Once Daily For 4 Days, Then 60mg  Tab For 4 Days, Then 40 Mg Tab For 4 Days, Then 20mg  Once Daily 10)  Ambien 10 Mg Tabs (Zolpidem Tartrate) .... Take 1 Tab By Mouth At Bedtime  Allergies (verified): No Known Drug Allergies  Past History:  Past Medical History: Last updated: 11/26/2009 Lupus RA chronic pain  Family History: Last updated: 08/11/2009 no significant medical family history  Social History: Last updated: 11/17/2009 Single Alcohol use-no Drug use-no Regular exercise-yes  Risk Factors: Alcohol Use: 0 (01/11/2010) Exercise: no (01/11/2010)  Risk Factors: Smoking Status: quit (01/11/2010) Packs/Day: 2.0 (01/11/2010)  Family History: Reviewed history from 08/11/2009 and no changes required. no significant medical family history  Social History: Reviewed history from 11/17/2009 and no changes required. Single Alcohol use-no Drug use-no Regular exercise-yes Smoking Status:  quit  Review of Systems       per HPI  Physical Exam  General:  Well-developed,well-nourished,in no acute distress; alert,appropriate and cooperative throughout examination Lungs:  normal respiratory effort and no accessory muscle use.   Heart:  Normal rate and regular rhythm. S1 and S2 normal without gallop, murmur, click, rub or other extra sounds. Abdomen:  Bowel sounds positive,abdomen soft and non-tender without masses, organomegaly or hernias noted. Neurologic:  alert & oriented X3 and gait normal.   Psych:  memory intact for recent and remote and normally interactive.     Impression & Recommendations:  Problem # 1:  BACK PAIN  (ICD-724.5) Well controlled, cont the same regimen.  His updated medication list for this problem includes:    Oxycodone Hcl 10 Mg Tabs (Oxycodone hcl) .[name]... Take 1 tablet by mouth every 6 hours as needed for pain    Aspirin 81 Mg Tbec (Aspirin) .[name]... Take 1 tablet by mouth once a day  Discussed use of moist heat or ice, modified activities, medications, and stretching/strengthening exercises. Back care instructions given. To be seen in 2 weeks if no improvement; sooner if worsening of symptoms.   Problem # 2:  HYPERLIPIDEMIA (ICD-272.4) At goal, will cont the same regimen.  His updated medication list for this problem includes:    Zocor 20 Mg Tabs (Simvastatin) .[name]... Take 1 tablet once daily at bedtime  Labs Reviewed: SGOT: 13 (10/08/2009)   SGPT: 33 (10/08/2009)   HDL:52 (10/08/2009)  LDL:86 (10/08/2009)  Chol:165 (10/08/2009)  Trig:134 (  10/08/2009)  Problem # 3:  TOBACCO ABUSE (ICD-305.1)  Encouraged smoking cessation and discussed different methods for smoking cessation.   Problem # 4:  RHEUMATOID ARTHRITIS (ICD-714.0) Will have appointment with Dr. Nickola Major, will follow up on recommendations.   Complete Medication List: 1)  Humira 40 Mg/0.9ml Kit (Adalimumab) .... Inject 40 mg subcutaneously every other week on monday 2)  Oxycodone Hcl 10 Mg Tabs (Oxycodone hcl) .... Take 1 tablet by mouth every 6 hours as needed for pain 3)  Methotrexate 2.5 Mg Tabs (Methotrexate sodium) .... Take ten tablets by mouth every monday. 4)  Prednisone 20 Mg Tabs (Prednisone) .... Take 1 tablet by mouth once a day 5)  Meclizine Hcl 25 Mg Tabs (Meclizine hcl) .... Take 1 tablet by mouth every 6 hours as needed for dizziness 6)  Aspirin 81 Mg Tbec (Aspirin) .... Take 1 tablet by mouth once a day 7)  Zocor 20 Mg Tabs (Simvastatin) .... Take 1 tablet once daily at bedtime 8)  Oscal 500/200 D-3 500-200 Mg-unit Tabs (Calcium-vitamin d) .... Take 1 tablet twice daily 9)  Prednisone 20 Mg Tabs (Prednisone)  .... Take 80 mg tab once daily for 4 days, then 60mg  tab for 4 days, then 40 mg tab for 4 days, then 20mg  once daily 10)  Ambien 10 Mg Tabs (Zolpidem tartrate) .... Take 1 tab by mouth at bedtime  Patient Instructions: 1)  Please schedule a follow-up appointment in 3 months.   Prevention & Chronic Care Immunizations   Influenza vaccine: Not documented   Influenza vaccine deferral: Not indicated  (10/08/2009)    Tetanus booster: 11/17/2009: Td   Td booster deferral: Not indicated  (10/08/2009)    Pneumococcal vaccine: Not documented  Other Screening   Smoking status: quit  (01/11/2010)  Lipids   Total Cholesterol: 165  (10/08/2009)   LDL: 86  (10/08/2009)   LDL Direct: Not documented   HDL: 52  (10/08/2009)   Triglycerides: 134  (10/08/2009)    SGOT (AST): 13  (10/08/2009)   BMP action: Ordered   SGPT (ALT): 33  (10/08/2009)   Alkaline phosphatase: 77  (10/08/2009)   Total bilirubin: 0.5  (10/08/2009)    Lipid flowsheet reviewed?: Yes   Progress toward LDL goal: At goal  Self-Management Support :   Personal Goals (by the next clinic visit) :      Personal LDL goal: 100  (10/18/2009)    Patient will work on the following items until the next clinic visit to reach self-care goals:     Medications and monitoring: take my medicines every day, bring all of my medications to every visit, weigh myself weekly  (01/11/2010)     Eating: drink diet soda or water instead of juice or soda, eat more vegetables, use fresh or frozen vegetables, eat baked foods instead of fried foods, eat fruit for snacks and desserts, limit or avoid alcohol  (01/11/2010)     Activity: take a 30 minute walk every day  (01/11/2010)    Lipid self-management support: Written self-care plan, Education handout, Resources for patients handout  (01/11/2010)   Lipid self-care plan printed.   Lipid education handout printed      Resource handout printed.

## 2010-07-28 NOTE — Assessment & Plan Note (Signed)
Summary: BACK PAIN/SB.   Vital Signs:  Patient profile:   50 year old male Height:      71 inches (180.34 cm) Weight:      174.8 pounds (79.45 kg) BMI:     24.47 Temp:     97.9 degrees F (36.61 degrees C) oral Pulse rate:   88 / minute BP sitting:   128 / 86  (left arm)  Vitals Entered By: Stanton Kidney Ditzler RN (July 20, 2010 9:10 AM) Is Patient Diabetic? No Pain Assessment Patient in pain? yes     Location: hips and back Intensity: 7 Type: chronic Onset of pain  long time Nutritional Status BMI of 19 -24 = normal Nutritional Status Detail appetite good  Have you ever been in a relationship where you felt threatened, hurt or afraid?denies   Does patient need assistance? Functional Status Self care Ambulation Normal Comments Discuss pain med. 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   Primary Care Provider:  Mliss Sax MD   History of Present Illness: 50 y/o m with chornic back pain and naroctic dependancy comes to the clinic for pain medication refill  He asks for ocycodone 10mg  which he was getting before.   Depression History:      The patient denies a depressed mood most of the day and a diminished interest in his usual daily activities.         Preventive Screening-Counseling & Management  Alcohol-Tobacco     Alcohol drinks/day: 0     Smoking Status: current     Smoking Cessation Counseling: yes     Packs/Day: 0.5     Pack years: 4-6 A DAY  Caffeine-Diet-Exercise     Does Patient Exercise: yes     Type of exercise: WALKING     Exercise (avg: min/session): 30-60     Times/week: 5-7  Current Medications (verified): 1)  Prednisone 20 Mg Tabs (Prednisone) .... Take 1 Tablet By Mouth Once A Day 2)  Oscal 500/200 D-3 500-200 Mg-Unit Tabs (Calcium-Vitamin D) .... Take 1 Tablet Twice Daily 3)  Ambien 10 Mg Tabs (Zolpidem Tartrate) .... Take 1 Tab By Mouth At Bedtime 4)  Tramadol Hcl 50 Mg Tabs (Tramadol Hcl) .... Take 1 Tablet Every 4-6  Hours As Needed For Pain 5)  Simvastatin 20 Mg Tabs (Simvastatin) .... Take 1 Tablet By Mouth Once A Day 6)  Percocet 5-325 Mg Tabs (Oxycodone-Acetaminophen) .[name].. 1 Tab Every 8 Hrs As Neede For Pain  Allergies (verified): No Known Drug Allergies  Social History: Packs/Day:  0.5  Physical Exam  General:  Gen: VS reveiwed, Alert, well developed, nodistress ENT: mucous membranes pink & moist. No abnormal finds in ear and nose. CVC:S1 S2 , no murmurs, no abnormal heart sounds. Lungs: Clear to auscultation B/L. No wheezes, crackles or other abnormal sounds Abdomen: soft, non distended, no tender. Normal Bowel sounds EXT: no pitting edema, no engorged veins, Pulsations normal  Neuro:alert, oriented *3, cranial nerved 2-12 intact, strenght normal in all  extremities, senstations normal to light touch.      Impression & Recommendations:  Problem # 1:  CHRONIC PAIN SYNDROME (ICD-338.4) Chornic back pain has been on different naroctic pain medications for a long time Had episode of elevate LFTs in setting of methotrexate toxicity at which time his percocet was changed from 10/650 to 5/325. He has been taking 3 tablets of the reduced percocet a day and OTC pain medication as needed. He request to go back on  his previous dose of 10mg  percocets Looking back at the records, I see that he violated his pain contract once when he got morphine and perocets from the ED ( which he refuses)  He was given some more percocets after that from the clinic though. At this time, I am not comfortable to give him higher dose of perocets given his history or give him an early prescription of 5/325 perocet. I discussed with him the option of going to pain clinic where just one doctor can manage his pain medications in a more orgnized way, he seems agreeble to it. Also, discussed this with Dr. Sharman Crate and the attending who seems ok with it. Will refer him to pain clinic and rschedule him for follow up in 1 monht here with  the pcp for his other medical problem.   Orders: Pain Clinic Referral (Pain)  I have a reviewed the previous records including hospital and ED records, radiographs and lab values. Spent more than 35 minutes discussing the importance of medication compliance, managment of chronic diseases and lifestyle changes with the patient.    Complete Medication List: 1)  Prednisone 20 Mg Tabs (Prednisone) .... Take 1 tablet by mouth once a day 2)  Oscal 500/200 D-3 500-200 Mg-unit Tabs (Calcium-vitamin d) .... Take 1 tablet twice daily 3)  Ambien 10 Mg Tabs (Zolpidem tartrate) .... Take 1 tab by mouth at bedtime 4)  Tramadol Hcl 50 Mg Tabs (Tramadol hcl) .... Take 1 tablet every 4-6 hours as needed for pain 5)  Simvastatin 20 Mg Tabs (Simvastatin) .... Take 1 tablet by mouth once a day 6)  Percocet 5-325 Mg Tabs (Oxycodone-acetaminophen) .[name].. 1 tab every 8 hrs as neede for pain  Patient Instructions: 1)  Please schedule a follow-up appointment in 1-2 months with pcp.   Orders Added: 1)  Est. Patient Level IV [14782] 2)  Pain Clinic Referral [Pain]     Prevention & Chronic Care Immunizations   Influenza vaccine: Not documented   Influenza vaccine deferral: Not indicated  (10/08/2009)    Tetanus booster: 11/17/2009: Td   Td booster deferral: Not indicated  (10/08/2009)    Pneumococcal vaccine: Not documented  Colorectal Screening   Hemoccult: Not documented    Colonoscopy: Not documented  Other Screening   PSA: Not documented   Smoking status: current  (07/20/2010)   Smoking cessation counseling: yes  (07/20/2010)  Lipids   Total Cholesterol: 165  (10/08/2009)   LDL: 86  (10/08/2009)   LDL Direct: Not documented   HDL: 52  (10/08/2009)   Triglycerides: 134  (10/08/2009)    SGOT (AST): 26  (04/19/2010)   BMP action: Ordered   SGPT (ALT): 41  (04/19/2010)   Alkaline phosphatase: 153  (04/19/2010)   Total bilirubin: 0.3  (04/19/2010)  Self-Management Support :   Personal  Goals (by the next clinic visit) :      Personal LDL goal: 100  (10/18/2009)    Patient will work on the following items until the next clinic visit to reach self-care goals:     Medications and monitoring: take my medicines every day, bring all of my medications to every visit, weigh myself weekly  (07/20/2010)     Eating: drink diet soda or water instead of juice or soda, eat more vegetables, use fresh or frozen vegetables, eat baked foods instead of fried foods, eat fruit for snacks and desserts  (07/20/2010)     Activity: take a 30 minute walk every day  (07/20/2010)  Lipid self-management support: Written self-care plan, Education handout, Resources for patients handout  (07/20/2010)   Lipid self-care plan printed.   Lipid education handout printed      Resource handout printed.

## 2010-07-28 NOTE — Progress Notes (Signed)
Summary: Appointment  Phone Note Outgoing Call   Call placed by: Angelina Ok RN,  April 22, 2010 10:57 AM Call placed to: Insurer Summary of Call: Call from pt asking to goto Dr. Marylene Land Hawkes-Rheumatology.  Call to r. Hawes office pt had a scheduled appointment cancelled and was rescheduled and then no showed.   Spoke with Dora new pt co-ordinator.  Pt will need to call the office for the appointment himself.  He will also be informed that if he no shows again he will be unable to get an appointment with Dr. Judee Clara.  Call to pt message left on answering machine to call the Clinics concerning an appointment. Angelina Ok RN  April 21, 2010 5:56 PM

## 2010-07-28 NOTE — Progress Notes (Signed)
Summary: phone/gg  Phone Note Refill Request  on April 20, 2010 3:27 PM  Refills Requested: Medication #1:  PERCOCET 5-325 MG TABS 1 tab every 8 hrs as neede for pain.   Last Refilled: 03/25/2010 Pt states Rheumatologist said he should not take tylenol   Method Requested: Pick up at Office Initial call taken by: Merrie Roof RN,  April 20, 2010 3:27 PM  Follow-up for Phone Call        ok for refill,  thanks iskra Follow-up by: Mliss Sax MD,  April 20, 2010 5:05 PM    Prescriptions: PERCOCET 5-325 MG TABS (OXYCODONE-ACETAMINOPHEN) 1 tab every 8 hrs as neede for pain  #90 x 0   Entered and Authorized by:   Mliss Sax MD   Signed by:   Mliss Sax MD on 04/20/2010   Method used:   Print then Give to Patient   RxID:   [id number]   Appended Document: phone/gg Pt.here to pick up Percocet Rx.

## 2010-07-28 NOTE — Medication Information (Signed)
Summary: [facility] QUERY REPORT  [facility] QUERY REPORT   Imported By: Shon Hough 02/02/2010 14:34:43  _____________________________________________________________________  External Attachment:    Type:   Image     Comment:   External Document

## 2010-07-28 NOTE — Miscellaneous (Signed)
Summary: Hosp D/C: TIA vs Vertigo  Hospital Discharge  Date of admission: 07/24/2009  Date of discharge: 07/26/2009  Brief reason for admission/active problems: Patient admitted for hx of slurred speech, unstable gait and dizziness which were resolved by presentation to the ED. He states that he had two episodes during December as well. Neuro consulted from the ED physicians prior to admission and thought it was likely vertigo and not related to a TIA. Concern for TIA given hx of autoimmune disorders and possibility of vasculitis prompted more thorough evaluation. MRI, CT of Head, 2D ECHO and carotid dopplers were negative.  Followup needed: 1. Please evaluate for continued episodes of dizziness and if meclizine has helped if he has continued episodes. 2. He had a lipid panel done with an LDL of 108, will likely benefit from low dose statin.  The medication and problem lists have been updated.  Please see the dictated discharge summary for details.\   Problems: Added new problem of SYSTEMIC LUPUS ERYTHEMATOSUS (ICD-710.0) Added new problem of RHEUMATOID ARTHRITIS (ICD-714.0) Added new problem of Question of  TIA (ICD-435.9) - TIA vs Vertigo Added new problem of TOBACCO ABUSE (ICD-305.1) - Down from 2 ppd to 1/2 ppd. Added new problem of Question of  VERTIGO (ICD-780.4) - TIA vs. vertigo Medications: Added new medication of HUMIRA 40 MG/0.8ML KIT (ADALIMUMAB) Inject 40 mg subcutaneously every other week on Monday Added new medication of PERCOCET 5-325 MG TABS (OXYCODONE-ACETAMINOPHEN) Take 1 tablet by mouth two times a day as needed for pain. Added new medication of METHOTREXATE 2.5 MG TABS (METHOTREXATE SODIUM) Take ten tablets by mouth every Monday. Added new medication of PREDNISONE 20 MG TABS (PREDNISONE) Take 1 tablet by mouth once a day Added new medication of MECLIZINE HCL 25 MG TABS (MECLIZINE HCL) Take 1 tablet by mouth every 6 hours as needed for dizziness Added new medication of  ASPIRIN 81 MG TBEC (ASPIRIN) Take 1 tablet by mouth once a day

## 2010-07-28 NOTE — Progress Notes (Signed)
Summary: refill/gg  Phone Note Refill Request  on June 28, 2010 3:28 PM  Refills Requested: Medication #1:  PERCOCET 5-325 MG TABS 1 tab every 8 hrs as neede for pain.   Last Refilled: 05/30/2010 Call when ready [phone]   Method Requested: Pick up at Office Initial call taken by: Merrie Roof RN,  June 28, 2010 3:28 PM  Follow-up for Phone Call        Pt last seen Oct. Has narc contract 02/22/10. Ran name through [employer] - got narcs Er 05/23/10 (#10) and 04/09/10 (20) - a violation of his contact. Would you pls sch an appt next 30 days - [facility] if at all possible. I did not send  flag to [facility] to sch appt. I will refill 30 days only but he must be seen to get any additional meds;  Follow-up by: Blanch Media MD,  June 30, 2010 6:56 PM  Additional Follow-up for Phone Call Additional follow up Details #1::        I agree with the plan, I have OPC in Feb and would be willing to see him at any time I have an opening. Additional Follow-up by: Mliss Sax MD,  July 01, 2010 9:07 AM    Prescriptions: PERCOCET 5-325 MG TABS (OXYCODONE-ACETAMINOPHEN) 1 tab every 8 hrs as neede for pain  #90 x 0   Entered and Authorized by:   Blanch Media MD   Signed by:   Blanch Media MD on 06/30/2010   Method used:   Print then Give to Patient   RxID:   [id number]   Appended Document: refill/gg    Clinical Lists Changes  Medications: Rx of PERCOCET 5-325 MG TABS (OXYCODONE-ACETAMINOPHEN) 1 tab every 8 hrs as neede for pain;  #90 x 0;  Signed;  Entered by: Blanch Media MD;  Authorized by: Blanch Media MD;  Method used: Print then Give to Patient    Prescriptions: PERCOCET 5-325 MG TABS (OXYCODONE-ACETAMINOPHEN) 1 tab every 8 hrs as neede for pain  #90 x 0   Entered and Authorized by:   Blanch Media MD   Signed by:   Blanch Media MD on 07/01/2010   Method used:   Print then Give to Patient   RxID:   [id number]

## 2010-07-28 NOTE — Miscellaneous (Signed)
Summary: MEDICAL/SURGICAL/DIAGNOSTIC PROCEDURES  MEDICAL/SURGICAL/DIAGNOSTIC PROCEDURES   Imported By: Louretta Parma 03/01/2010 15:23:32  _____________________________________________________________________  External Attachment:    Type:   Image     Comment:   External Document

## 2010-07-28 NOTE — Medication Information (Signed)
Summary: PERCOCET  PERCOCET   Imported By: Margie Billet 07/06/2010 11:57:53  _____________________________________________________________________  External Attachment:    Type:   Image     Comment:   External Document

## 2010-07-28 NOTE — Assessment & Plan Note (Signed)
Summary: SEVERE BACK PAIN/[facility]/DS   Vital Signs:  Patient profile:   50 year old male Height:      71 inches (180.34 cm) Weight:      154.8 pounds (70.36 kg) BMI:     21.67 Temp:     97.2 degrees F (36.22 degrees C) oral Pulse rate:   79 / minute BP sitting:   130 / 86  (left arm)  Vitals Entered By: Stanton Kidney Ditzler RN (Oct 29, 2009 11:33 AM) CC: Depression Is Patient Diabetic? No Pain Assessment Patient in pain? yes     Location: back and hips Intensity: 6-7 Type: sharp Onset of pain  past 4 years Nutritional Status BMI of < 19 = underweight Nutritional Status Detail appetite down  Have you ever been in a relationship where you felt threatened, hurt or afraid?denies   Does patient need assistance? Functional Status Self care Ambulation Normal Comments Friend with pt. Back pain getting worse - not sleeping. Discuss x-rays.   CC:  Depression.  History of Present Illness: 50 year old with Past Medical History: Hyperlipidemia, RA, Lupus.  He is still having back pain and hip pain, over years, getting worse last two months. unable to sleep at night because of pain. , pain is worse  when he laying down. He denies fever, chills, decrease appetite last week. subjective weight loss.  He denies weakness of lower extremities, no numbness.   Depression History:      The patient denies a depressed mood most of the day and a diminished interest in his usual daily activities.         Preventive Screening-Counseling & Management  Alcohol-Tobacco     Smoking Status: current     Smoking Cessation Counseling: yes     Packs/Day: 1/2 ppd     Pack years: 4-6 A DAY  Current Medications (verified): 1)  Humira 40 Mg/0.49ml Kit (Adalimumab) .... Inject 40 Mg Subcutaneously Every Other Week On Monday 2)  Norco 10-325 Mg Tabs (Hydrocodone-Acetaminophen) .... Take 1 Tablet Every 4-6  Hours As Needed For Pain 3)  Methotrexate 2.5 Mg Tabs (Methotrexate Sodium) .... Take Ten Tablets By  Mouth Every Monday. 4)  Prednisone 20 Mg Tabs (Prednisone) .... Take 1 Tablet By Mouth Once A Day 5)  Meclizine Hcl 25 Mg Tabs (Meclizine Hcl) .... Take 1 Tablet By Mouth Every 6 Hours As Needed For Dizziness 6)  Aspirin 81 Mg Tbec (Aspirin) .... Take 1 Tablet By Mouth Once A Day 7)  Zocor 20 Mg Tabs (Simvastatin) .... Take 1 Tablet Once Daily At Bedtime 8)  Oscal 500/200 D-3 500-200 Mg-Unit Tabs (Calcium-Vitamin D) .... Take 1 Tablet Twice Daily 9)  Prednisone 20 Mg Tabs (Prednisone) .... Take 80 Mg Tab Once Daily For 4 Days, Then 60mg  Tab For 4 Days, Then 40 Mg Tab For 4 Days, Then 20mg  Once Daily  Allergies: No Known Drug Allergies  Review of Systems  The patient denies fever, chest pain, syncope, dyspnea on exertion, peripheral edema, prolonged cough, headaches, hemoptysis, abdominal pain, melena, and hematochezia.    Physical Exam  General:  alert, well-developed, and well-nourished.   Head:  normocephalic and atraumatic.   Lungs:  normal respiratory effort, no intercostal retractions, no accessory muscle use, and normal breath sounds.   Heart:  normal rate and regular rhythm.   Abdomen:  soft, non-tender, and normal bowel sounds.   Msk:  normal ROM, no joint swelling, no joint warmth, no redness over joints, no joint deformities, no  joint instability, and spinal pocess  tenderness on palpation , para spinal muscle.   Neurologic:  alert & oriented X3, cranial nerves II-XII intact, strength normal in all extremities, sensation intact to light touch, and DTRs symmetrical and normal.     Impression & Recommendations:  Problem # 1:  RHEUMATOID ARTHRITIS (ICD-714.0) I dont think that his back pain is secondary to RA flare his pain has not improved with higher prednisone dose. Also ESR  and CRP was not significant elevated.  I will refer him to Rheumatologyst so he can stablish care. His prior Rheumatologist Dr Phylliss Bob died.   Problem # 2:  BACK PAIN (ICD-724.5) This can be due to back  sprain, muscle skeletal pain, vs component of RA . case discussed  with Dr Coralee Pesa, will try flexeril. If this doesnt help will consider physical therapy.  His updated medication list for this problem includes:    Norco 10-325 Mg Tabs (Hydrocodone-acetaminophen) .[name]... Take 1 tablet every 4-6  hours as needed for pain    Aspirin 81 Mg Tbec (Aspirin) .[name]... Take 1 tablet by mouth once a day    Flexeril 10 Mg Tabs (Cyclobenzaprine hcl) .[name]... Take 1 or half tablet every 12 hour as needed for pain.  Complete Medication List: 1)  Humira 40 Mg/0.72ml Kit (Adalimumab) .... Inject 40 mg subcutaneously every other week on monday 2)  Norco 10-325 Mg Tabs (Hydrocodone-acetaminophen) .... Take 1 tablet every 4-6  hours as needed for pain 3)  Methotrexate 2.5 Mg Tabs (Methotrexate sodium) .... Take ten tablets by mouth every monday. 4)  Prednisone 20 Mg Tabs (Prednisone) .... Take 1 tablet by mouth once a day 5)  Meclizine Hcl 25 Mg Tabs (Meclizine hcl) .... Take 1 tablet by mouth every 6 hours as needed for dizziness 6)  Aspirin 81 Mg Tbec (Aspirin) .... Take 1 tablet by mouth once a day 7)  Zocor 20 Mg Tabs (Simvastatin) .... Take 1 tablet once daily at bedtime 8)  Oscal 500/200 D-3 500-200 Mg-unit Tabs (Calcium-vitamin d) .... Take 1 tablet twice daily 9)  Prednisone 20 Mg Tabs (Prednisone) .... Take 80 mg tab once daily for 4 days, then 60mg  tab for 4 days, then 40 mg tab for 4 days, then 20mg  once daily 10)  Flexeril 10 Mg Tabs (Cyclobenzaprine hcl) .... Take 1 or half tablet every 12 hour as needed for pain.  Patient Instructions: 1)  Please schedule a follow-up appointment in 1 month with PCP. Prescriptions: NORCO 10-325 MG TABS (HYDROCODONE-ACETAMINOPHEN) take 1 tablet every 4-6  hours as needed for pain  #120 x 0   Entered and Authorized by:   Hartley Barefoot MD   Signed by:   Hartley Barefoot MD on 10/29/2009   Method used:   Print then Give to Patient   RxID:   [id number] FLEXERIL 10 MG  TABS (CYCLOBENZAPRINE HCL) Take 1 or half tablet every 12 hour as needed for pain.  #60 x 0   Entered and Authorized by:   Hartley Barefoot MD   Signed by:   Hartley Barefoot MD on 10/29/2009   Method used:   Print then Give to Patient   RxID:   [id number]   Prevention & Chronic Care Immunizations   Influenza vaccine: Not documented   Influenza vaccine deferral: Not indicated  (10/08/2009)    Tetanus booster: Not documented   Td booster deferral: Not indicated  (10/08/2009)    Pneumococcal vaccine: Not documented  Other Screening   Smoking status: current  (  10/29/2009)   Smoking cessation counseling: yes  (10/29/2009)  Lipids   Total Cholesterol: 165  (10/08/2009)   LDL: 86  (10/08/2009)   LDL Direct: Not documented   HDL: 52  (10/08/2009)   Triglycerides: 134  (10/08/2009)    SGOT (AST): 13  (10/08/2009)   BMP action: Ordered   SGPT (ALT): 33  (10/08/2009)   Alkaline phosphatase: 77  (10/08/2009)   Total bilirubin: 0.5  (10/08/2009)  Self-Management Support :   Personal Goals (by the next clinic visit) :      Personal LDL goal: 100  (10/18/2009)    Patient will work on the following items until the next clinic visit to reach self-care goals:     Medications and monitoring: take my medicines every day, weigh myself weekly  (10/29/2009)     Eating: drink diet soda or water instead of juice or soda, eat more vegetables, use fresh or frozen vegetables, eat foods that are low in salt, eat fruit for snacks and desserts, limit or avoid alcohol  (10/29/2009)     Activity: take a 30 minute walk every day  (10/29/2009)    Lipid self-management support: Written self-care plan, Education handout, Resources for patients handout  (10/29/2009)   Lipid self-care plan printed.   Lipid education handout printed      Resource handout printed.
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
## 2010-07-28 NOTE — Progress Notes (Signed)
Summary: Refill/gh  Phone Note Refill Request Message from:  Patient on May 27, 2010 10:05 AM  Refills Requested: Medication #1:  PERCOCET 5-325 MG TABS 1 tab every 8 hrs as neede for pain.   Last Refilled: 04/27/2010  Method Requested: Pick up at Office Initial call taken by: Angelina Ok RN,  May 27, 2010 10:05 AM  Follow-up for Phone Call        Pt here to pick up prescription.  Follow-up by: Angelina Ok RN,  May 30, 2010 10:47 AM     Prescriptions: PERCOCET 5-325 MG TABS (OXYCODONE-ACETAMINOPHEN) 1 tab every 8 hrs as neede for pain  #90 x 0   Entered and Authorized by:   Zoila Shutter MD   Signed by:   Zoila Shutter MD on 05/30/2010   Method used:   Print then Give to Patient   RxID:   [id number]

## 2010-07-28 NOTE — Assessment & Plan Note (Signed)
Summary: EST-CK/FU/MEDS/CFB   Vital Signs:  Patient profile:   50 year old male Height:      71 inches (180.34 cm) Weight:      155.8 pounds (70.82 kg) BMI:     21.81 Temp:     97.8 degrees F (36.56 degrees C) oral Pulse rate:   90 / minute BP sitting:   131 / 88  (right arm)  Vitals Entered By: Stanton Kidney Ditzler RN (October 08, 2009 9:59 AM) Is Patient Diabetic? No Pain Assessment Patient in pain? yes     Location: hips and feet Intensity: 6 Type: aching Onset of pain  some time Nutritional Status BMI of 19 -24 = normal Nutritional Status Detail appetite good  Have you ever been in a relationship where you felt threatened, hurt or afraid?denies   Does patient need assistance? Functional Status Self care Ambulation Normal Comments FU - hips and feet hurting - knots on back of ankles.   History of Present Illness: 50 yo male with PMH outlined below present to [facility] for regular follow up appointment. He has no concerns at the time. No recent sicknesses or hospitalizaitons. No episodes of chest pain, SOB, palpitations. No specific abdominal or urinary concerns. No recent changes in appetite, weight, sleep patterns, mood. Has not noticed any bumps or lumps anywhere on the body, he is taking his pain medicine evry 4 hours and needed and has run out of it. He reports it is adequate in pain control.    Depression History:      The patient denies a depressed mood most of the day and a diminished interest in his usual daily activities.  The patient denies significant weight loss, significant weight gain, insomnia, hypersomnia, psychomotor agitation, psychomotor retardation, fatigue (loss of energy), feelings of worthlessness (guilt), impaired concentration (indecisiveness), and recurrent thoughts of death or suicide.        The patient denies that he feels like life is not worth living, denies that he wishes that he were dead, and denies that he has thought about ending his life.          Preventive Screening-Counseling & Management  Alcohol-Tobacco     Smoking Status: current     Smoking Cessation Counseling: yes     Packs/Day: 7 cigs per day     Pack years: 4-6 A DAY  Problems Prior to Update: 1)  ? of Vertigo  (ICD-780.4) 2)  Tobacco Abuse  (ICD-305.1) 3)  ? of Tia  (ICD-435.9) 4)  Rheumatoid Arthritis  (ICD-714.0) 5)  Systemic Lupus Erythematosus  (ICD-710.0)  Medications Prior to Update: 1)  Humira 40 Mg/0.9ml Kit (Adalimumab) .... Inject 40 Mg Subcutaneously Every Other Week On Monday 2)  Norco 10-325 Mg Tabs (Hydrocodone-Acetaminophen) .... Take 1 Tablet Every 8 Hours As Needed For Pain 3)  Methotrexate 2.5 Mg Tabs (Methotrexate Sodium) .... Take Ten Tablets By Mouth Every Monday. 4)  Prednisone 20 Mg Tabs (Prednisone) .... Take 1 Tablet By Mouth Once A Day 5)  Meclizine Hcl 25 Mg Tabs (Meclizine Hcl) .... Take 1 Tablet By Mouth Every 6 Hours As Needed For Dizziness 6)  Aspirin 81 Mg Tbec (Aspirin) .... Take 1 Tablet By Mouth Once A Day 7)  Zocor 20 Mg Tabs (Simvastatin) .... Take 1 Tablet Once Daily At Bedtime  Current Medications (verified): 1)  Humira 40 Mg/0.60ml Kit (Adalimumab) .... Inject 40 Mg Subcutaneously Every Other Week On Monday 2)  Norco 10-325 Mg Tabs (Hydrocodone-Acetaminophen) .... Take 1 Tablet Every 8  Hours As Needed For Pain 3)  Methotrexate 2.5 Mg Tabs (Methotrexate Sodium) .... Take Ten Tablets By Mouth Every Monday. 4)  Prednisone 20 Mg Tabs (Prednisone) .... Take 1 Tablet By Mouth Once A Day 5)  Meclizine Hcl 25 Mg Tabs (Meclizine Hcl) .... Take 1 Tablet By Mouth Every 6 Hours As Needed For Dizziness 6)  Aspirin 81 Mg Tbec (Aspirin) .... Take 1 Tablet By Mouth Once A Day 7)  Zocor 20 Mg Tabs (Simvastatin) .... Take 1 Tablet Once Daily At Bedtime  Allergies (verified): No Known Drug Allergies  Past History:  Family History: Last updated: 08/11/2009 no significant medical family history  Risk Factors: Smoking Status: current  (10/08/2009) Packs/Day: 7 cigs per day (10/08/2009)  Family History: Reviewed history from 08/11/2009 and no changes required. no significant medical family history  Social History: Packs/Day:  7 cigs per day  Review of Systems       per HPI  Physical Exam  General:  Well-developed,well-nourished,in no acute distress; alert,appropriate and cooperative throughout examination Lungs:  Normal respiratory effort, chest expands symmetrically. Lungs are clear to auscultation, no crackles or wheezes. Heart:  Normal rate and regular rhythm. S1 and S2 normal without gallop, murmur, click, rub or other extra sounds. Abdomen:  Bowel sounds positive,abdomen soft and non-tender without masses, organomegaly or hernias noted. Msk:  No deformity or scoliosis noted of thoracic or lumbar spine.   Neurologic:  No cranial nerve deficits noted. Station and gait are normal. Plantar reflexes are down-going bilaterally. DTRs are symmetrical throughout. Sensory, motor and coordinative functions appear intact. Psych:  Cognition and judgment appear intact. Alert and cooperative with normal attention span and concentration. No apparent delusions, illusions, hallucinations   Impression & Recommendations:  Problem # 1:  TOBACCO ABUSE (ICD-305.1)  Encouraged smoking cessation and discussed different methods for smoking cessation.   Problem # 2:  RHEUMATOID ARTHRITIS (ICD-714.0) Patient was seeing Dr. Phylliss Bob but says he can not afford it any longer and would like for [place] to continue the RA management. I will have to get the record from Dr. Renaldo Reel office to confirm the treatment. He has been on chronic prednisone 20 mg once daily dosing and I also think he will need to be on cal + vid D supplement. He is also on methotrexate and in order for me to continue perscribing this I will need the paperwork from Dr. Renaldo Reel office. Will continue the current pain regimen but will increase the dosing from three times a day to 4-6  times per day.  Problem # 3:  HYPERLIPIDEMIA (ICD-272.4)  Last FLP done Jan 2011 with Chol = 163 and LDL 108, will repeat today and readjust the regimen as indicated.  His updated medication list for this problem includes:    Zocor 20 Mg Tabs (Simvastatin) .[name]... Take 1 tablet once daily at bedtime  Orders: T-Lipid Profile ([id number])  Problem # 4:  ? of VERTIGO (ICD-780.4) No vertigo episodes, will continue the same regimen.  His updated medication list for this problem includes:    Meclizine Hcl 25 Mg Tabs (Meclizine hcl) .[name]... Take 1 tablet by mouth every 6 hours as needed for dizziness  Problem # 5:  CALLUS, FOOT (ICD-700) [facility] but they want patient to call them first and they will schedule an appointment once they talk to him. Orders: Podiatry Referral (Podiatry)  Complete Medication List: 1)  Humira 40 Mg/0.96ml Kit (Adalimumab) .... Inject 40 mg subcutaneously every other week on monday 2)  Norco 10-325 Mg Tabs (Hydrocodone-acetaminophen) .... Take 1 tablet every 4-6  hours as needed for pain 3)  Methotrexate 2.5 Mg Tabs (Methotrexate sodium) .... Take ten tablets by mouth every monday. 4)  Prednisone 20 Mg Tabs (Prednisone) .... Take 1 tablet by mouth once a day 5)  Meclizine Hcl 25 Mg Tabs (Meclizine hcl) .... Take 1 tablet by mouth every 6 hours as needed for dizziness 6)  Aspirin 81 Mg Tbec (Aspirin) .... Take 1 tablet by mouth once a day 7)  Zocor 20 Mg Tabs (Simvastatin) .... Take 1 tablet once daily at bedtime 8)  Oscal 500/200 D-3 500-200 Mg-unit Tabs (Calcium-vitamin d) .... Take 1 tablet twice daily  Other Orders: T-Hepatic Function ([id number])  Patient Instructions: 1)  Please schedule a follow-up appointment in 6 months. 2)  Please check your blood pressure regularly, if it is >170 please call clinic at [phone]. 3)  Please call [facility] at [phone]. 4)  Cigarette smoking is estimated to be responsible for over five million  premature deaths worldwide, making it the leading preventable cause of death. The most important causes of smoking-related mortality include atherosclerotic cardiovascular disease, lung cancer, and chronic obstructive pulmonary disease (COPD).  Prescriptions: ZOCOR 20 MG TABS (SIMVASTATIN) take 1 tablet once daily at bedtime  #30 x 3   Entered and Authorized by:   Mliss Sax MD   Signed by:   Mliss Sax MD on 10/08/2009   Method used:   Print then Give to Patient   RxID:   [id number] PREDNISONE 20 MG TABS (PREDNISONE) Take 1 tablet by mouth once a day  #30 x 3   Entered and Authorized by:   Mliss Sax MD   Signed by:   Mliss Sax MD on 10/08/2009   Method used:   Print then Give to Patient   RxID:   [id number] METHOTREXATE 2.5 MG TABS (METHOTREXATE SODIUM) Take ten tablets by mouth every Monday.  #40 x 3   Entered and Authorized by:   Mliss Sax MD   Signed by:   Mliss Sax MD on 10/08/2009   Method used:   Print then Give to Patient   RxID:   [id number] OSCAL 500/200 D-3 500-200 MG-UNIT TABS (CALCIUM-VITAMIN D) take 1 tablet twice daily  #60 x 3   Entered and Authorized by:   Mliss Sax MD   Signed by:   Mliss Sax MD on 10/08/2009   Method used:   Print then Give to Patient   RxID:   [id number] NORCO 10-325 MG TABS (HYDROCODONE-ACETAMINOPHEN) take 1 tablet every 4-6  hours as needed for pain  #120 x 0   Entered and Authorized by:   Mliss Sax MD   Signed by:   Mliss Sax MD on 10/08/2009   Method used:   Print then Give to Patient   RxID:   [id number]  Process Orders Check Orders Results:     [facility]: Check successful Tests Sent for requisitioning (October 08, 2009 10:58 AM):     10/08/2009: [facility] -- T-Lipid Profile [phone] (signed)     10/08/2009: [facility] -- T-Hepatic Function [phone] (signed)    Prevention & Chronic Care Immunizations   Influenza  vaccine: Not documented   Influenza vaccine deferral: Not indicated  (10/08/2009)    Tetanus booster: Not documented   Td booster deferral: Not indicated  (10/08/2009)    Pneumococcal vaccine: Not documented  Other Screening   Smoking status: current  (10/08/2009)  Smoking cessation counseling: yes  (10/08/2009)  Lipids   Total Cholesterol: Not documented   LDL: Not documented   LDL Direct: Not documented   HDL: Not documented   Triglycerides: Not documented    SGOT (AST): Not documented   BMP action: Ordered   SGPT (ALT): Not documented   Alkaline phosphatase: Not documented   Total bilirubin: Not documented    Lipid flowsheet reviewed?: Yes   Progress toward LDL goal: At goal  Self-Management Support :    Lipid self-management support: Resources for patients handout, Written self-care plan  (10/08/2009)   Lipid self-care plan printed.      Resource handout printed.

## 2010-07-28 NOTE — Progress Notes (Signed)
Summary: refill/ hla  Phone Note Refill Request Message from:  Patient on January 21, 2010 12:29 PM  Refills Requested: Medication #1:  OXYCODONE HCL 10 MG TABS take 1 tablet by mouth every 6 hours as needed for pain   Dosage confirmed as above?Dosage Confirmed   Last Refilled: 7/1 Initial call taken by: Marin Roberts RN,  January 21, 2010 12:29 PM  Follow-up for Phone Call        Dr Clabe Seal note indicates that Dr Phylliss Bob Rx'd but that she discussed with Dr Phylliss Bob and it was OK for [place] to refill. THat makes me a bit nervous - I want to run her through the [facility] narc database to make certain tha she isn't getting from both [place] and [place]. If OK, will print off in the AM.   Follow-up by: Blanch Media MD,  January 24, 2010 3:50 PM  Additional Follow-up for Phone Call Additional follow up Details #1::        narcotic contract dated 11/26/09. Appropriate refills since then. Prior to contract, had been getting from several other MD's. Will refill and scan database results into compute. Additional Follow-up by: Blanch Media MD,  January 24, 2010 5:51 PM    Additional Follow-up for Phone Call Additional follow up Details #2::    Pt called for prescription left message on voicemail.  RTC to pt informed that prescription is ready for pick up. Follow-up by: Angelina Ok RN,  January 25, 2010 9:51 AM  Prescriptions: OXYCODONE HCL 10 MG TABS (OXYCODONE HCL) take 1 tablet by mouth every 6 hours as needed for pain  #120 x 0   Entered and Authorized by:   Blanch Media MD   Signed by:   Blanch Media MD on 01/25/2010   Method used:   Print then Give to Patient   RxID:   [id number] OXYCODONE HCL 10 MG TABS (OXYCODONE HCL) take 1 tablet by mouth every 6 hours as needed for pain  #120 x 0   Entered and Authorized by:   Blanch Media MD   Signed by:   Blanch Media MD on 01/24/2010   Method used:   Telephoned to ...       [facility] [address] [phone]* (retail)       [address]       [address]       [place], [place]  [address]       Ph: [phone]       Fax: [phone]   RxID:   [id number]   Appended Document: refill/ hla    Prescriptions: OXYCODONE HCL 10 MG TABS (OXYCODONE HCL) take 1 tablet by mouth every 6 hours as needed for pain  #120 x 0   Entered and Authorized by:   Mliss Sax MD   Signed by:   Mliss Sax MD on 01/25/2010   Method used:   Print then Give to Patient   RxID:   [id number]

## 2010-07-28 NOTE — Miscellaneous (Signed)
Summary: HIPAA Restrictions  HIPAA Restrictions   Imported By: Florinda Marker 08/12/2009 14:34:40  _____________________________________________________________________  External Attachment:    Type:   Image     Comment:   External Document

## 2010-07-28 NOTE — Miscellaneous (Signed)
  Clinical Lists Changes  Problems: Added new problem of NONSPEC ELEVATION OF LEVELS OF TRANSAMINASE/LDH (ICD-790.4) Orders: Added new Test order of T-Hepatitis Profile Acute [phone]) - Signed

## 2010-07-28 NOTE — Progress Notes (Signed)
Summary: refill/gg  Phone Note Refill Request  on March 24, 2010 9:06 AM  Refills Requested: Medication #1:  MORPHINE SULFATE CR 15 MG XR12H-TAB Take 1 tablet by mouth twice daily   Last Refilled: 02/22/2010  Medication #2:  OXYCODONE-ACETAMINOPHEN 5-325 MG TABS Take 1 tablet by mouth every 6 hours as needed for severe pain   Last Refilled: 02/22/2010  Method Requested: Pick up at Office Initial call taken by: Merrie Roof RN,  March 24, 2010 9:07 AM  Follow-up for Phone Call        Prescriptions printed and signed - nurse to complete. Follow-up by: Margarito Liner MD,  March 25, 2010 11:52 AM  Additional Follow-up for Phone Call Additional follow up Details #1::        pt informed Additional Follow-up by: Marin Roberts RN,  March 25, 2010 12:00 PM    Prescriptions: MORPHINE SULFATE CR 15 MG XR12H-TAB (MORPHINE SULFATE) Take 1 tablet by mouth twice daily  #60 x 0   Entered and Authorized by:   Margarito Liner MD   Signed by:   Margarito Liner MD on 03/25/2010   Method used:   Print then Give to Patient   RxID:   [id number] OXYCODONE-ACETAMINOPHEN 5-325 MG TABS (OXYCODONE-ACETAMINOPHEN) Take 1 tablet by mouth every 6 hours as needed for severe pain  #120 x 0   Entered and Authorized by:   Margarito Liner MD   Signed by:   Margarito Liner MD on 03/25/2010   Method used:   Print then Give to Patient   RxID:   [id number]

## 2010-07-28 NOTE — Miscellaneous (Signed)
  Clinical Lists Changes  Orders: Added new Referral order of Endocrinology Referral (Endocrine) - Signed

## 2010-07-28 NOTE — Progress Notes (Signed)
Summary: walk-in/ hla  Phone Note Other Incoming   Summary of Call: pt comes in to ask about simvastatin, dr Scot Dock spoke w/ him, will restart, ask about using percocet, also ok. also will do referral to a new rheumatologist Initial call taken by: Marin Roberts RN,  April 21, 2010 4:29 PM  Follow-up for Phone Call        Patient wants to a new rheumatologist and says he wants to go to Dr. Orlin Hilding. will put in referral. LFTs normalized, can restart tylenol containing products and statin with close follow up on LFTs. methotrexate and humera is stopped by his rheumatologisy Follow-up by: Bethel Born MD,  April 21, 2010 4:36 PM

## 2010-07-28 NOTE — Progress Notes (Signed)
Summary: appt, f/u, reaction/ hla  Phone Note Call from Patient   Summary of Call: pt calls and states he needs pain meds early due to needing to take more than the prescribed dose because of the " reaction i had to that shot in my shoulder" i ask for more information, pt states he fell immediately to the floor when the injection was done and continues to experience severe pain causing a need for more pain medicine. pt is informed he will need to be evaluated for this, he asks why and is told that due to such a severe reaction and the continuation of pain then he made need other meds. appt made Initial call taken by: Marin Roberts RN,  February 22, 2010 12:09 PM  Follow-up for Phone Call        Agree with need to see patient if the pain from a shoulder injection is that bad. Follow-up by: Doneen Poisson MD,  February 22, 2010 12:28 PM

## 2010-07-28 NOTE — Assessment & Plan Note (Signed)
Summary: NEW TO CLINIC/ SB.   Vital Signs:  Patient profile:   50 year old male Height:      71 inches Weight:      155.9 pounds BMI:     21.82 Temp:     97.0 degrees F oral Pulse rate:   85 / minute BP sitting:   117 / 83  (right arm)  Vitals Entered By: Filomena Jungling NT II (August 11, 2009 1:52 PM) CC: HFU Is Patient Diabetic? No Pain Assessment Patient in pain? yes     Location: shoulder, and chest Intensity: 7 Type: aching Onset of pain  18 months Nutritional Status BMI of 19 -24 = normal  Does patient need assistance? Functional Status Self care Ambulation Normal   CC:  HFU.  History of Present Illness: The patient is a 50 year old male with PMHx of lupus and rheumatoid arthritis, who presents to clinic for regular follow up on his recent hospitalization for TIA (slurred speech, unsteady gait). Patient denies any new episodes of unsteady gait or slurred speech no chest pain, palpitations, shortness of breath, no increased work of breathing, no bowel or bladder incontinence, or feeling confused.  Pt feels overall great and is compliant with medications. He is also working on his diet changes and exercising regularly (working around the house).     Preventive Screening-Counseling & Management  Alcohol-Tobacco     Smoking Status: current     Pack years: 4-6 A DAY  Problems Prior to Update: 1)  ? of Vertigo  (ICD-780.4) 2)  Tobacco Abuse  (ICD-305.1) 3)  ? of Tia  (ICD-435.9) 4)  Rheumatoid Arthritis  (ICD-714.0) 5)  Systemic Lupus Erythematosus  (ICD-710.0)  Medications Prior to Update: 1)  Humira 40 Mg/0.51ml Kit (Adalimumab) .... Inject 40 Mg Subcutaneously Every Other Week On Monday 2)  Percocet 5-325 Mg Tabs (Oxycodone-Acetaminophen) .... Take 1 Tablet By Mouth Two Times A Day As Needed For Pain. 3)  Methotrexate 2.5 Mg Tabs (Methotrexate Sodium) .... Take Ten Tablets By Mouth Every Monday. 4)  Prednisone 20 Mg Tabs (Prednisone) .... Take 1 Tablet By Mouth  Once A Day 5)  Meclizine Hcl 25 Mg Tabs (Meclizine Hcl) .... Take 1 Tablet By Mouth Every 6 Hours As Needed For Dizziness 6)  Aspirin 81 Mg Tbec (Aspirin) .... Take 1 Tablet By Mouth Once A Day  Current Medications (verified): 1)  Humira 40 Mg/0.42ml Kit (Adalimumab) .... Inject 40 Mg Subcutaneously Every Other Week On Monday 2)  Norco 10-325 Mg Tabs (Hydrocodone-Acetaminophen) .... Take 1 Tablet Every 8 Hours As Needed For Pain 3)  Methotrexate 2.5 Mg Tabs (Methotrexate Sodium) .... Take Ten Tablets By Mouth Every Monday. 4)  Prednisone 20 Mg Tabs (Prednisone) .... Take 1 Tablet By Mouth Once A Day 5)  Meclizine Hcl 25 Mg Tabs (Meclizine Hcl) .... Take 1 Tablet By Mouth Every 6 Hours As Needed For Dizziness 6)  Aspirin 81 Mg Tbec (Aspirin) .... Take 1 Tablet By Mouth Once A Day 7)  Zocor 20 Mg Tabs (Simvastatin) .... Take 1 Tablet Once Daily At Bedtime  Allergies (verified): No Known Drug Allergies  Family History: Reviewed history and no changes required. no significant medical family history  Social History: Smoking Status:  current  Review of Systems       per HPI  Physical Exam  General:  Well-developed,well-nourished,in no acute distress; alert,appropriate and cooperative throughout examination Lungs:  normal respiratory effort, no intercostal retractions, no accessory muscle use, and normal breath sounds.  Heart:  normal rate, regular rhythm, and no murmur.   Neurologic:  No cranial nerve deficits noted. Station and gait are normal. Plantar reflexes are down-going bilaterally. DTRs are symmetrical throughout. Sensory, motor and coordinative functions appear intact. Psych:  Cognition and judgment appear intact. Alert and cooperative with normal attention span and concentration. No apparent delusions, illusions, hallucinations   Impression & Recommendations:  Problem # 1:  ? of TIA (ICD-435.9) Comes in for follow up and on PE no focal neurologic deficits noted. Pt advised  to continue taking ASA daily. We will start patient on low dose statin. He is working on his dietary changes and is planning on staying active. I have advised him that if he experiences any new symptoms of dizziness, weakness, slurred speech, etc. he needs to go to ED.  His updated medication list for this problem includes:    Aspirin 81 Mg Tbec (Aspirin) .[name]... Take 1 tablet by mouth once a day  Problem # 2:  TOBACCO ABUSE (ICD-305.1)  Encouraged smoking cessation and discussed different methods for smoking cessation.   Problem # 3:  RHEUMATOID ARTHRITIS (ICD-714.0) He has been following with Dr. Phylliss Bob and is continuing treatment with Humira, Methotrexate and prednisone. Will continue to follow up on Dr. Renaldo Reel recommendations.   Complete Medication List: 1)  Humira 40 Mg/0.30ml Kit (Adalimumab) .... Inject 40 mg subcutaneously every other week on monday 2)  Norco 10-325 Mg Tabs (Hydrocodone-acetaminophen) .... Take 1 tablet every 8 hours as needed for pain 3)  Methotrexate 2.5 Mg Tabs (Methotrexate sodium) .... Take ten tablets by mouth every monday. 4)  Prednisone 20 Mg Tabs (Prednisone) .... Take 1 tablet by mouth once a day 5)  Meclizine Hcl 25 Mg Tabs (Meclizine hcl) .... Take 1 tablet by mouth every 6 hours as needed for dizziness 6)  Aspirin 81 Mg Tbec (Aspirin) .... Take 1 tablet by mouth once a day 7)  Zocor 20 Mg Tabs (Simvastatin) .... Take 1 tablet once daily at bedtime  Patient Instructions: 1)  Please schedule a follow-up appointment in 3 months. 2)  Check your Blood Pressure regularly. If it is above 170: you should make an appointment. Prescriptions: ZOCOR 20 MG TABS (SIMVASTATIN) take 1 tablet once daily at bedtime  #30 x 3   Entered and Authorized by:   Mliss Sax MD   Signed by:   Mliss Sax MD on 08/11/2009   Method used:   Print then Give to Patient   RxID:   [id number] NORCO 10-325 MG TABS (HYDROCODONE-ACETAMINOPHEN) take 1 tablet every 8 hours as needed  for pain  #90 x 0   Entered and Authorized by:   Mliss Sax MD   Signed by:   Mliss Sax MD on 08/11/2009   Method used:   Print then Give to Patient   RxID:   [id number]

## 2010-07-28 NOTE — Medication Information (Signed)
Summary: OXYCODONE  OXYCODONE   Imported By: Margie Billet 12/28/2009 11:23:30  _____________________________________________________________________  External Attachment:    Type:   Image     Comment:   External Document

## 2010-07-28 NOTE — Assessment & Plan Note (Signed)
Summary: hips and low back swelling and painful/pcp-magick/hla   Vital Signs:  Patient profile:   50 year old male Height:      71 inches (180.34 cm) Weight:      157.4 pounds (71.55 kg) BMI:     22.03 Temp:     97.3 degrees F (36.28 degrees C) oral Pulse rate:   96 / minute BP sitting:   125 / 87  (right arm)  Vitals Entered By: Stanton Kidney Ditzler RN (October 18, 2009 9:48 AM) Is Patient Diabetic? No Pain Assessment Patient in pain? yes     Location: back and hips Intensity: 6-7 Type: aching Onset of pain  long time - getting worse Nutritional Status BMI of 19 -24 = normal Nutritional Status Detail appetite good  Have you ever been in a relationship where you felt threatened, hurt or afraid?denies   Does patient need assistance? Functional Status Self care Ambulation Normal Comments Discuss back and hip pain.   History of Present Illness: 50 yo make with PMH outlined below presents to [facility] with main concern of what seems like RA flare. He has been having flares in the past but has been flare free for over 1 year. He has been on chronic steroids (prednisone 20 mg) and humira and tells me that that regimen has been OK for him and was keeping his RA in control. He is not sure what has happened in the past weeks but he started to fell tired and was in pain especially lower back and bilateral hips. His wife tells me that he appeared to have flare as this was his usual presentation when he has one. He has not been taking Calcium and vit D until last week but was on steroids since 1992. He also has Lupus and has noticed that he developed rash over his cheeks several weeks ago. He denies fever, chills, chest pain, SOB, palpitations, orthopnea, cough, no specific abdominal or urinary concerns. No changes in appetite, mood, or sleeping patterns. Has been taking medicines as perscribed.   Depression History:      The patient denies a depressed mood most of the day and a diminished interest in  his usual daily activities.  The patient denies significant weight loss, significant weight gain, insomnia, hypersomnia, psychomotor agitation, psychomotor retardation, fatigue (loss of energy), feelings of worthlessness (guilt), impaired concentration (indecisiveness), and recurrent thoughts of death or suicide.        The patient denies that he feels like life is not worth living, denies that he wishes that he were dead, and denies that he has thought about ending his life.         Preventive Screening-Counseling & Management  Alcohol-Tobacco     Smoking Status: current     Smoking Cessation Counseling: yes     Packs/Day: 1/2 ppd     Pack years: 4-6 A DAY  Problems Prior to Update: 1)  Callus, Foot  (ICD-700) 2)  Hyperlipidemia  (ICD-272.4) 3)  ? of Vertigo  (ICD-780.4) 4)  Tobacco Abuse  (ICD-305.1) 5)  ? of Tia  (ICD-435.9) 6)  Rheumatoid Arthritis  (ICD-714.0) 7)  Systemic Lupus Erythematosus  (ICD-710.0)  Medications Prior to Update: 1)  Humira 40 Mg/0.30ml Kit (Adalimumab) .... Inject 40 Mg Subcutaneously Every Other Week On Monday 2)  Norco 10-325 Mg Tabs (Hydrocodone-Acetaminophen) .... Take 1 Tablet Every 4-6  Hours As Needed For Pain 3)  Methotrexate 2.5 Mg Tabs (Methotrexate Sodium) .... Take Ten Tablets By Mouth Every Monday.  4)  Prednisone 20 Mg Tabs (Prednisone) .... Take 1 Tablet By Mouth Once A Day 5)  Meclizine Hcl 25 Mg Tabs (Meclizine Hcl) .... Take 1 Tablet By Mouth Every 6 Hours As Needed For Dizziness 6)  Aspirin 81 Mg Tbec (Aspirin) .... Take 1 Tablet By Mouth Once A Day 7)  Zocor 20 Mg Tabs (Simvastatin) .... Take 1 Tablet Once Daily At Bedtime 8)  Oscal 500/200 D-3 500-200 Mg-Unit Tabs (Calcium-Vitamin D) .... Take 1 Tablet Twice Daily  Current Medications (verified): 1)  Humira 40 Mg/0.80ml Kit (Adalimumab) .... Inject 40 Mg Subcutaneously Every Other Week On Monday 2)  Norco 10-325 Mg Tabs (Hydrocodone-Acetaminophen) .... Take 1 Tablet Every 4-6  Hours As  Needed For Pain 3)  Methotrexate 2.5 Mg Tabs (Methotrexate Sodium) .... Take Ten Tablets By Mouth Every Monday. 4)  Prednisone 20 Mg Tabs (Prednisone) .... Take 1 Tablet By Mouth Once A Day 5)  Meclizine Hcl 25 Mg Tabs (Meclizine Hcl) .... Take 1 Tablet By Mouth Every 6 Hours As Needed For Dizziness 6)  Aspirin 81 Mg Tbec (Aspirin) .... Take 1 Tablet By Mouth Once A Day 7)  Zocor 20 Mg Tabs (Simvastatin) .... Take 1 Tablet Once Daily At Bedtime 8)  Oscal 500/200 D-3 500-200 Mg-Unit Tabs (Calcium-Vitamin D) .... Take 1 Tablet Twice Daily 9)  Prednisone 20 Mg Tabs (Prednisone) .... Take 80 Mg Tab Once Daily For 4 Days, Then 60mg  Tab For 4 Days, Then 40 Mg Tab For 4 Days, Then 20mg  Once Daily  Allergies (verified): No Known Drug Allergies  Past History:  Family History: Last updated: 08/11/2009 no significant medical family history  Risk Factors: Smoking Status: current (10/18/2009) Packs/Day: 1/2 ppd (10/18/2009)  Family History: Reviewed history from 08/11/2009 and no changes required. no significant medical family history  Social History: Packs/Day:  1/2 ppd  Review of Systems       per HPI   Physical Exam  General:  Well-developed,well-nourished,in no acute distress; alert,appropriate and cooperative throughout examination Chest Wall:  No deformities, masses, tenderness or gynecomastia noted. Lungs:  Normal respiratory effort, chest expands symmetrically. Lungs are clear to auscultation, no crackles or wheezes. Heart:  Normal rate and regular rhythm. S1 and S2 normal without gallop, murmur, click, rub or other extra sounds. Abdomen:  Bowel sounds positive,abdomen soft and non-tender without masses, organomegaly or hernias noted. Msk:  paraspinal tenderness in lumbar area noted bilaterally, normal ROM, no joint swelling, no joint warmth, and no redness over joints, heberden's and buchard's nodes bilaterally, unchanged from previous exam, normal ROM, no joint swelling, no  joint warmth, no redness over joints, no crepitation, and no muscle atrophy.   Pulses:  R and L carotid,radial,femoral,dorsalis pedis and posterior tibial pulses are full and equal bilaterally Extremities:  No clubbing, cyanosis, edema, or deformity noted with normal full range of motion of all joints.   Neurologic:  No cranial nerve deficits noted. Station and gait are normal. Plantar reflexes are down-going bilaterally. DTRs are symmetrical throughout. Sensory, motor and coordinative functions appear intact. Skin:  redness over the cheeks and nose, no other rashes, no suspicious lesions, no ecchymoses, no petechiae, no purpura, no ulcerations, and no edema.   Cervical Nodes:  No lymphadenopathy noted   Impression & Recommendations:  Problem # 1:  RHEUMATOID ARTHRITIS (ICD-714.0) Questionable flare, it is certainly worrisome since he appears somewhat sick compared to his previous visit presentations. This could be related to Lupus flare as well, will check ESR and CRP today and  compare to his baseline. He has been on chronic steroids and was not on Ca and Vit D until last week so at this point will have to make sure to get DEXA scan especially in the setting of his new onset low back pain and B hip pain. I will strat him on steroids, higher dose than what he is taking, oral rather than IV pulse steroids and will see how he does over the next week. If he is not feeling any better he will need to see rheumatologist ASAP and he has been refusing to see Dr. Phylliss Bob but we can get him to see Dr. Nickola Major if necessary. He is alsready on adequate RA management with MTX, Humira, and prednisone.  Orders: Dexa scan (Dexa scan) Radiology other (Radiology Other) T-C-Reactive Protein [phone]) T-Sed Rate (Automated) [id number])  Problem # 2:  HYPERLIPIDEMIA (ICD-272.4) Cont the same regimen. His updated medication list for this problem includes:    Zocor 20 Mg Tabs (Simvastatin) .[name]... Take 1 tablet once  daily at bedtime  Labs Reviewed: SGOT: 13 (10/08/2009)   SGPT: 33 (10/08/2009)   HDL:52 (10/08/2009)  LDL:86 (10/08/2009)  Chol:165 (10/08/2009)  Trig:134 (10/08/2009)  Problem # 3:  TOBACCO ABUSE (ICD-305.1)  Encouraged smoking cessation and discussed different methods for smoking cessation.   Problem # 4:  SYSTEMIC LUPUS ERYTHEMATOSUS (ICD-710.0) Questionable lupus flare, will follow up on ESR and CRP, cont. the same regimen. His updated medication list for this problem includes:    Humira 40 Mg/0.10ml Kit (Adalimumab) ..... Inject 40 mg subcutaneously every other week on monday    Methotrexate 2.5 Mg Tabs (Methotrexate sodium) .[name]... Take ten tablets by mouth every monday.    Prednisone 20 Mg Tabs (Prednisone) .[name]... Take 1 tablet by mouth once a day    Aspirin 81 Mg Tbec (Aspirin) .[name]... Take 1 tablet by mouth once a day    Prednisone 20 Mg Tabs (Prednisone) .[name]... Take 80 mg tab once daily for 4 days, then 60mg  tab for 4 days, then 40 mg tab for 4 days, then 20mg  once daily  Complete Medication List: 1)  Humira 40 Mg/0.49ml Kit (Adalimumab) .... Inject 40 mg subcutaneously every other week on monday 2)  Norco 10-325 Mg Tabs (Hydrocodone-acetaminophen) .... Take 1 tablet every 4-6  hours as needed for pain 3)  Methotrexate 2.5 Mg Tabs (Methotrexate sodium) .... Take ten tablets by mouth every monday. 4)  Prednisone 20 Mg Tabs (Prednisone) .... Take 1 tablet by mouth once a day 5)  Meclizine Hcl 25 Mg Tabs (Meclizine hcl) .... Take 1 tablet by mouth every 6 hours as needed for dizziness 6)  Aspirin 81 Mg Tbec (Aspirin) .... Take 1 tablet by mouth once a day 7)  Zocor 20 Mg Tabs (Simvastatin) .... Take 1 tablet once daily at bedtime 8)  Oscal 500/200 D-3 500-200 Mg-unit Tabs (Calcium-vitamin d) .... Take 1 tablet twice daily 9)  Prednisone 20 Mg Tabs (Prednisone) .... Take 80 mg tab once daily for 4 days, then 60mg  tab for 4 days, then 40 mg tab for 4 days, then 20mg  once daily  Patient  Instructions: 1)  Please schedule a follow-up appointment in 1 month. 2)  Cigarette smoking is estimated to be responsible for over five million premature deaths worldwide, making it the leading preventable cause of death. The most important causes of smoking-related mortality include atherosclerotic cardiovascular disease, lung cancer, and chronic obstructive pulmonary disease (COPD).  3)  Tobacco is very bad for your health and your  loved ones! You Should stop smoking!. 4)  Stop Smoking Tips: Choose a Quit date. Cut down before the Quit date. decide what you will do as a substitute when you feel the urge to smoke(gum,toothpick,exercise). Prescriptions: PREDNISONE 20 MG TABS (PREDNISONE) take 80 mg tab once daily for 4 days, then 60mg  tab for 4 days, then 40 mg tab for 4 days, then 20mg  once daily  #30 x 3   Entered and Authorized by:   Mliss Sax MD   Signed by:   Mliss Sax MD on 10/18/2009   Method used:   Electronically to        [facility] Dr[address] [phone]* (retail)       [address]       [address]       [place], [place]  [address]       Ph: [phone]       Fax: [phone]   RxID:   [id number]  Process Orders Check Orders Results:     [facility]: Check successful Tests Sent for requisitioning (October 18, 2009 11:24 AM):     10/18/2009: [facility] -- T-C-Reactive Protein [id number] (signed)     10/18/2009: [facility] -- T-Sed Rate (Automated) [phone] (signed)    Prevention & Chronic Care Immunizations   Influenza vaccine: Not documented   Influenza vaccine deferral: Not indicated  (10/08/2009)    Tetanus booster: Not documented   Td booster deferral: Not indicated  (10/08/2009)    Pneumococcal vaccine: Not documented  Other Screening   Smoking status: current  (10/18/2009)   Smoking cessation counseling: yes  (10/18/2009)  Lipids   Total Cholesterol: 165  (10/08/2009)   LDL: 86   (10/08/2009)   LDL Direct: Not documented   HDL: 52  (10/08/2009)   Triglycerides: 134  (10/08/2009)    SGOT (AST): 13  (10/08/2009)   BMP action: Ordered   SGPT (ALT): 33  (10/08/2009)   Alkaline phosphatase: 77  (10/08/2009)   Total bilirubin: 0.5  (10/08/2009)    Lipid flowsheet reviewed?: Yes   Progress toward LDL goal: At goal  Self-Management Support :   Personal Goals (by the next clinic visit) :      Personal LDL goal: 100  (10/18/2009)    Patient will work on the following items until the next clinic visit to reach self-care goals:     Medications and monitoring: take my medicines every day, weigh myself weekly  (10/18/2009)     Eating: eat more vegetables, use fresh or frozen vegetables, eat baked foods instead of fried foods, eat fruit for snacks and desserts, limit or avoid alcohol  (10/18/2009)     Activity: take a 30 minute walk every day  (10/18/2009)    Lipid self-management support: Written self-care plan, Education handout, Resources for patients handout  (10/18/2009)   Lipid self-care plan printed.   Lipid education handout printed      Resource handout printed.  Process Orders Check Orders Results:     [facility]: Check successful Tests Sent for requisitioning (October 18, 2009 11:24 AM):     10/18/2009: [facility] -- T-C-Reactive Protein [[id number]] (signed)     10/18/2009: [facility] -- T-Sed Rate (Automated) [phone] (signed)
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
## 2010-07-28 NOTE — Assessment & Plan Note (Signed)
Summary: extreme shoulder and back pain/pcp-magick/hla   Vital Signs:  Patient profile:   50 year old male Height:      71 inches (180.34 cm) Weight:      162.2 pounds (73.73 kg) BMI:     22.70 Temp:     97.3 degrees F (36.28 degrees C) oral Pulse rate:   84 / minute BP sitting:   137 / 97  (right arm)  Vitals Entered By: Chinita Pester RN (November 26, 2009 3:59 PM) CC: Continues to have left shoulder pain;requesting increased in pain med. dosage and steroid injection. Is Patient Diabetic? No Pain Assessment Patient in pain? yes     Location: left shoulder Intensity: 7 Type: aching Onset of pain  Intermittent Nutritional Status BMI of 19 -24 = normal  Have you ever been in a relationship where you felt threatened, hurt or afraid?No   Does patient need assistance? Functional Status Self care Ambulation Normal   CC:  Continues to have left shoulder pain;requesting increased in pain med. dosage and steroid injection.[name]  History of Present Illness: Mr Sia is a 50 yo man with PMH as outlined below.  He is here for left shoulder pain.  Seen on 5/25 by Dr. Aldine Contes and was changed to percocet 7.5/500.  He reports its some better but remains in pain.  He has also started working as mentioned in prior note.  He has not worked since 1992 and thinks this may be contributing.  He also reports having a cortisone injection with good results for a few weeks and was wondering if this could be done again.  This is not acute change in pain, just not well controlled.   Depression History:      The patient denies a depressed mood most of the day and a diminished interest in his usual daily activities.         Preventive Screening-Counseling & Management  Alcohol-Tobacco     Alcohol drinks/day: 0     Smoking Status: current     Smoking Cessation Counseling: yes     Packs/Day: 2.0     Pack years: 4-6 A DAY  Caffeine-Diet-Exercise     Does Patient Exercise: no  Allergies: No Known Drug  Allergies  Past History:  Past Medical History: Lupus RA chronic pain  Social History: Packs/Day:  2.0 Does Patient Exercise:  no  Physical Exam  General:  alert.   Lungs:  normal respiratory effort and no accessory muscle use.   Msk:  no redness or swelling of joints Neurologic:  alert & oriented X3 and gait normal.   Psych:  memory intact for recent and remote and normally interactive.     Impression & Recommendations:  Problem # 1:  RHEUMATOID ARTHRITIS (ICD-714.0) Chronic shoulder pain. Per prior notes, will change to oxycodone 10mg  q6 #120 with pain contract.  of note, pt has percocet left over from prior script.  Will need long acting opiates in near future by PCP for more adequate pain control and to lessen pill supply.... pt has insurance.   Complete Medication List: 1)  Humira 40 Mg/0.60ml Kit (Adalimumab) .... Inject 40 mg subcutaneously every other week on monday 2)  Oxycodone Hcl 10 Mg Tabs (Oxycodone hcl) .... Take 1 tablet by mouth every 6 hours as needed for pain 3)  Methotrexate 2.5 Mg Tabs (Methotrexate sodium) .... Take ten tablets by mouth every monday. 4)  Prednisone 20 Mg Tabs (Prednisone) .... Take 1 tablet by mouth once a day  5)  Meclizine Hcl 25 Mg Tabs (Meclizine hcl) .... Take 1 tablet by mouth every 6 hours as needed for dizziness 6)  Aspirin 81 Mg Tbec (Aspirin) .... Take 1 tablet by mouth once a day 7)  Zocor 20 Mg Tabs (Simvastatin) .... Take 1 tablet once daily at bedtime 8)  Oscal 500/200 D-3 500-200 Mg-unit Tabs (Calcium-vitamin d) .... Take 1 tablet twice daily 9)  Prednisone 20 Mg Tabs (Prednisone) .... Take 80 mg tab once daily for 4 days, then 60mg  tab for 4 days, then 40 mg tab for 4 days, then 20mg  once daily 10)  Ambien 10 Mg Tabs (Zolpidem tartrate) .... Take 1 tab by mouth at bedtime  Patient Instructions: 1)  Please schedule a follow-up appointment in 1 month, please schedule with Dr. Aldine Contes during this time frame if at all  possible. 2)  Will change to oxycodone as prescribed below. 3)  Stop the percocet. 4)  Pain contract as discussed.  5)  Will likely switch to long acting medicine next time you see Dr. Aldine Contes. 6)  if you have any other problems, call clinic.  Prescriptions: OXYCODONE HCL 10 MG TABS (OXYCODONE HCL) take 1 tablet by mouth every 6 hours as needed for pain  #120 x 0   Entered and Authorized by:   Mariea Stable MD   Signed by:   Mariea Stable MD on 11/26/2009   Method used:   Print then Give to Patient   RxID:   [id number]   Prevention & Chronic Care Immunizations   Influenza vaccine: Not documented   Influenza vaccine deferral: Not indicated  (10/08/2009)    Tetanus booster: 11/17/2009: Td   Td booster deferral: Not indicated  (10/08/2009)    Pneumococcal vaccine: Not documented  Other Screening   Smoking status: current  (11/26/2009)   Smoking cessation counseling: yes  (11/26/2009)  Lipids   Total Cholesterol: 165  (10/08/2009)   LDL: 86  (10/08/2009)   LDL Direct: Not documented   HDL: 52  (10/08/2009)   Triglycerides: 134  (10/08/2009)    SGOT (AST): 13  (10/08/2009)   BMP action: Ordered   SGPT (ALT): 33  (10/08/2009)   Alkaline phosphatase: 77  (10/08/2009)   Total bilirubin: 0.5  (10/08/2009)  Self-Management Support :   Personal Goals (by the next clinic visit) :      Personal LDL goal: 100  (10/18/2009)    Patient will work on the following items until the next clinic visit to reach self-care goals:     Medications and monitoring: take my medicines every day  (11/26/2009)     Eating: eat baked foods instead of fried foods  (11/26/2009)     Activity: take a 30 minute walk every day  (10/29/2009)    Lipid self-management support: Written self-care plan  (11/26/2009)   Lipid self-care plan printed.  Copy of Pain Contract given to pt.   Chinita Pester RN  November 26, 2009 4:59 PM

## 2010-07-28 NOTE — Progress Notes (Signed)
Summary: phone/gg  Phone Note Call from Patient   Caller: Patient Summary of Call:  Pt called and states  shoulder pain has not gone completely away after taking percocet.  He would like a shot of cortisone.  I think this was talked about at the last visit. Initial call taken by: Merrie Roof RN,  November 25, 2009 5:17 PM  Follow-up for Phone Call        pt calls and say pain is extreme, appt is given for this pm Follow-up by: Marin Roberts RN,  November 26, 2009 3:42 PM  Additional Follow-up for Phone Call Additional follow up Details #1::        OK Additional Follow-up by: Blanch Media MD,  November 26, 2009 3:46 PM

## 2010-07-28 NOTE — Miscellaneous (Signed)
   Clinical Lists Changes  Orders: Added new Referral order of Rheumatology Referral (Rheumatology) - Signed 
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
## 2010-07-28 NOTE — Medication Information (Signed)
Summary: Tax adviser   Imported By: Louretta Parma 01/28/2010 15:33:08  _____________________________________________________________________  External Attachment:    Type:   Image     Comment:   External Document

## 2010-07-28 NOTE — Medication Information (Signed)
Summary: PERCOCET  PERCOCET   Imported By: Margie Billet 04/28/2010 09:11:25  _____________________________________________________________________  External Attachment:    Type:   Image     Comment:   External Document

## 2010-07-28 NOTE — Assessment & Plan Note (Signed)
Summary: EST-1 MONTH RECHECK/CH   Vital Signs:  Patient profile:   50 year old male Height:      71 inches (180.34 cm) Weight:      163.9 pounds (74.50 kg) BMI:     22.94 Temp:     97.8 degrees F (36.56 degrees C) oral Pulse rate:   89 / minute BP sitting:   151 / 100  (right arm) Cuff size:   regular  Vitals Entered By: Cynda Familia Duncan Dull) (Nov 17, 2009 3:31 PM) CC: f/u, still having "muscle spasms" in back-cant tolerate flexeril Is Patient Diabetic? No Pain Assessment Patient in pain? yes     Location: left shoulder Intensity: 8 Type: sharp Onset of pain  Chronic off and on for years Nutritional Status BMI of > 30 = obese  Have you ever been in a relationship where you felt threatened, hurt or afraid?No   Does patient need assistance? Functional Status Self care Ambulation Normal   CC:  f/u and still having "muscle spasms" in back-cant tolerate flexeril.  History of Present Illness: 50 yo male with PMH outlined below presents to [facility] for regular follow up appointment. His main concern is the pain medicine he is getting hydrocodone which he tells me he has been on for the past several months and has not helped him much even though he has been taking more of it. He is concerned that he is taking too much for pain and does not want to do this. 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 He has tried flexeril over the past few months but that has made him to sleepy and drowzy so his main question is what other options are there in terms for his chronic pain. He has no concerns at the time. No recent sicknesses or hospitalizaitons. No episodes of chest pain, SOB, palpitations. No specific abdominal or urinary concerns. No recent changes in appetite, weight, sleep patterns, mood.    Preventive Screening-Counseling & Management  Alcohol-Tobacco     Smoking Status: current     Smoking Cessation Counseling: yes     Packs/Day: 1/2 ppd  Pack years: 4-6 A DAY  Caffeine-Diet-Exercise     Does Patient Exercise: yes      Drug Use:  no.    Problems Prior to Update: 1)  Back Pain  (ICD-724.5) 2)  Steroid Use, Long Term  (ICD-V58.65) 3)  Callus, Foot  (ICD-700) 4)  Hyperlipidemia  (ICD-272.4) 5)  ? of Vertigo  (ICD-780.4) 6)  Tobacco Abuse  (ICD-305.1) 7)  ? of Tia  (ICD-435.9) 8)  Rheumatoid Arthritis  (ICD-714.0) 9)  Systemic Lupus Erythematosus  (ICD-710.0)  Medications Prior to Update: 1)  Humira 40 Mg/0.19ml Kit (Adalimumab) .... Inject 40 Mg Subcutaneously Every Other Week On Monday 2)  Norco 10-325 Mg Tabs (Hydrocodone-Acetaminophen) .... Take 1 Tablet Every 4-6  Hours As Needed For Pain 3)  Methotrexate 2.5 Mg Tabs (Methotrexate Sodium) .... Take Ten Tablets By Mouth Every Monday. 4)  Prednisone 20 Mg Tabs (Prednisone) .... Take 1 Tablet By Mouth Once A Day 5)  Meclizine Hcl 25 Mg Tabs (Meclizine Hcl) .... Take 1 Tablet By Mouth Every 6 Hours As Needed For Dizziness 6)  Aspirin 81 Mg Tbec (Aspirin) .... Take 1 Tablet By Mouth Once A Day 7)  Zocor 20 Mg Tabs (Simvastatin) .... Take 1 Tablet Once Daily At Bedtime 8)  Oscal 500/200 D-3 500-200 Mg-Unit Tabs (Calcium-Vitamin D) .... Take 1 Tablet  Twice Daily 9)  Prednisone 20 Mg Tabs (Prednisone) .... Take 80 Mg Tab Once Daily For 4 Days, Then 60mg  Tab For 4 Days, Then 40 Mg Tab For 4 Days, Then 20mg  Once Daily 10)  Flexeril 10 Mg Tabs (Cyclobenzaprine Hcl) .... Take 1 or Half Tablet Every 12 Hour As Needed For Pain.  Allergies (verified): No Known Drug Allergies  Past History:  Family History: Last updated: 08/11/2009 no significant medical family history  Risk Factors: Smoking Status: current (11/17/2009) Packs/Day: 1/2 ppd (11/17/2009)  Family History: Reviewed history from 08/11/2009 and no changes required. no significant medical family history  Social History: Reviewed history and no changes required. Single Alcohol use-no Drug use-no Regular  exercise-yes Drug Use:  no Does Patient Exercise:  yes  Review of Systems       per HPI  Physical Exam  General:  Well-developed,well-nourished,in no acute distress; alert,appropriate and cooperative throughout examination Lungs:  Normal respiratory effort, chest expands symmetrically. Lungs are clear to auscultation, no crackles or wheezes. Heart:  Normal rate and regular rhythm. S1 and S2 normal without gallop, murmur, click, rub or other extra sounds. Abdomen:  Bowel sounds positive,abdomen soft and non-tender without masses, organomegaly or hernias noted. Msk:  multiple areas joint tenderness, elbows, wrists, ankles, no swelling, no redness Neurologic:  No cranial nerve deficits noted. Station and gait are normal. Plantar reflexes are down-going bilaterally. DTRs are symmetrical throughout. Sensory, motor and coordinative functions appear intact. Skin:  redness over the cheecks and good turgor Cervical Nodes:  No lymphadenopathy noted Psych:  Cognition and judgment appear intact. Alert and cooperative with normal attention span and concentration. No apparent delusions, illusions, hallucinations   Impression & Recommendations:  Problem # 1:  BACK PAIN (ICD-724.5) Will try percocet and will monitor his response. If it does help I will have him sign the pain contract on his next visit.  The following medications were removed from the medication list:    Flexeril 10 Mg Tabs (Cyclobenzaprine hcl) .[name]... Take 1 or half tablet every 12 hour as needed for pain. His updated medication list for this problem includes:    Percocet 7.5-500 Mg Tabs (Oxycodone-acetaminophen) .[name]... Take 1 tablet every 4-6 hours as needed for pain    Aspirin 81 Mg Tbec (Aspirin) .[name]... Take 1 tablet by mouth once a day  Problem # 2:  HYPERLIPIDEMIA (ICD-272.4) Cont zocor. His updated medication list for this problem includes:    Zocor 20 Mg Tabs (Simvastatin) .[name]... Take 1 tablet once daily at bedtime  Labs  Reviewed: SGOT: 13 (10/08/2009)   SGPT: 33 (10/08/2009)   HDL:52 (10/08/2009)  LDL:86 (10/08/2009)  Chol:165 (10/08/2009)  Trig:134 (10/08/2009)  Problem # 3:  TOBACCO ABUSE (ICD-305.1)  Encouraged smoking cessation and discussed different methods for smoking cessation.   Problem # 4:  SYSTEMIC LUPUS ERYTHEMATOSUS (ICD-710.0) He his seeing Dr. Nickola Major next month and will follow up on recommendations.  His updated medication list for this problem includes:    Humira 40 Mg/0.46ml Kit (Adalimumab) ..... Inject 40 mg subcutaneously every other week on monday    Methotrexate 2.5 Mg Tabs (Methotrexate sodium) .[name]... Take ten tablets by mouth every monday.    Prednisone 20 Mg Tabs (Prednisone) .[name]... Take 1 tablet by mouth once a day    Aspirin 81 Mg Tbec (Aspirin) .[name]... Take 1 tablet by mouth once a day    Prednisone 20 Mg Tabs (Prednisone) .[name]... Take 80 mg tab once daily for 4 days, then 60mg  tab for 4  days, then 40 mg tab for 4 days, then 20mg  once daily  Complete Medication List: 1)  Humira 40 Mg/0.29ml Kit (Adalimumab) .... Inject 40 mg subcutaneously every other week on monday 2)  Percocet 7.5-500 Mg Tabs (Oxycodone-acetaminophen) .... Take 1 tablet every 4-6 hours as needed for pain 3)  Methotrexate 2.5 Mg Tabs (Methotrexate sodium) .... Take ten tablets by mouth every monday. 4)  Prednisone 20 Mg Tabs (Prednisone) .... Take 1 tablet by mouth once a day 5)  Meclizine Hcl 25 Mg Tabs (Meclizine hcl) .... Take 1 tablet by mouth every 6 hours as needed for dizziness 6)  Aspirin 81 Mg Tbec (Aspirin) .... Take 1 tablet by mouth once a day 7)  Zocor 20 Mg Tabs (Simvastatin) .... Take 1 tablet once daily at bedtime 8)  Oscal 500/200 D-3 500-200 Mg-unit Tabs (Calcium-vitamin d) .... Take 1 tablet twice daily 9)  Prednisone 20 Mg Tabs (Prednisone) .... Take 80 mg tab once daily for 4 days, then 60mg  tab for 4 days, then 40 mg tab for 4 days, then 20mg  once daily 10)  Ambien 10 Mg Tabs (Zolpidem tartrate)  .... Take 1 tab by mouth at bedtime  Other Orders: TD Toxoids IM 7 YR + (28413) Admin 1st Vaccine (24401)  Patient Instructions: 1)  Please schedule a follow-up appointment in 3 months. Prescriptions: AMBIEN 10 MG TABS (ZOLPIDEM TARTRATE) Take 1 tab by mouth at bedtime  #30 x 0   Entered and Authorized by:   Mliss Sax MD   Signed by:   Mliss Sax MD on 11/17/2009   Method used:   Print then Give to Patient   RxID:   [id number] PERCOCET 7.5-500 MG TABS (OXYCODONE-ACETAMINOPHEN) take 1 tablet every 4-6 hours as needed for pain  #90 x 0   Entered and Authorized by:   Mliss Sax MD   Signed by:   Mliss Sax MD on 11/17/2009   Method used:   Print then Give to Patient   RxID:   [id number]    Prevention & Chronic Care Immunizations   Influenza vaccine: Not documented   Influenza vaccine deferral: Not indicated  (10/08/2009)    Tetanus booster: 11/17/2009: Td   Td booster deferral: Not indicated  (10/08/2009)    Pneumococcal vaccine: Not documented  Other Screening   Smoking status: current  (11/17/2009)   Smoking cessation counseling: yes  (11/17/2009)  Lipids   Total Cholesterol: 165  (10/08/2009)   LDL: 86  (10/08/2009)   LDL Direct: Not documented   HDL: 52  (10/08/2009)   Triglycerides: 134  (10/08/2009)    SGOT (AST): 13  (10/08/2009)   BMP action: Ordered   SGPT (ALT): 33  (10/08/2009)   Alkaline phosphatase: 77  (10/08/2009)   Total bilirubin: 0.5  (10/08/2009)    Lipid flowsheet reviewed?: Yes   Progress toward LDL goal: At goal  Self-Management Support :   Personal Goals (by the next clinic visit) :      Personal LDL goal: 100  (10/18/2009)    Patient will work on the following items until the next clinic visit to reach self-care goals:     Medications and monitoring: take my medicines every day  (11/17/2009)     Eating: eat foods that are low in salt, eat baked foods instead of fried foods  (11/17/2009)     Activity: take a 30  minute walk every day  (10/29/2009)    Lipid self-management support: Pre-printed educational material, Resources for patients handout  (11/17/2009)  Resource handout printed.   Nursing Instructions: Give tetanus booster today     Immunizations Administered:  Tetanus Vaccine:    Vaccine Type: Td    Site: left deltoid    Mfr: Sanofi Pasteur    Dose: 0.5 ml    Route: IM    Given by: Cynda Familia ([facility])    Exp. Date: 07/09/2011    Lot #: [id number]    VIS given: 05/14/07 version given Nov 17, 2009.

## 2010-07-28 NOTE — Assessment & Plan Note (Signed)
Summary: problems/DS   Vital Signs:  Patient profile:   50 year old male Height:      71 inches Weight:      171.7 pounds BMI:     24.03 Temp:     97.5 degrees F oral Pulse rate:   91 / minute BP sitting:   128 / 86  (left arm) Cuff size:   regular  Vitals Entered By: Theotis Barrio NT II (April 04, 2010 2:17 PM) CC: CHRONIC BACK PAIN #  7  / NEED TO TALK ABOUT HIS MEDICATION / REFUSE THE FLU SHOT Is Patient Diabetic? No Pain Assessment Patient in pain? yes     Location:    BACK Intensity:      7 Type: ACHE/SHARP Onset of pain  CHRONIC PAIN Nutritional Status BMI of 19 -24 = normal  Have you ever been in a relationship where you felt threatened, hurt or afraid?No   Does patient need assistance? Functional Status Self care Ambulation Normal Comments REFUSE THE FLU SHOT /  NEED TO TALK WITH DR ABOUT  HIS MEDICATION                                                       CC:  CHRONIC BACK PAIN #  7  / NEED TO TALK ABOUT HIS MEDICATION / REFUSE THE FLU SHOT.  History of Present Illness: 50 yo male with PMH outlined below presents to [facility] for regular follow up appointment. He went to see rheumatologist Dr. Dareen Piano and was told his liver enzymes are elevated and he needs to stop taking metothrexate and tylenol products and needs to be seen by PCP.  He has no concerns at the time. No recent sicknesses or hospitalizaitons. No episodes of chest pain, SOB, palpitations, no fever or chills. No specific abdominal or urinary concerns. No recent changes in appetite, weight, sleep patterns, mood. He denies fever, chill, no alcohol or ilicit drug use. Has not noticed any color changes on the skin, no recent travel or sick contacts. No history of hepatitis as far as he knows.    Preventive Screening-Counseling & Management  Alcohol-Tobacco     Alcohol drinks/day: 0     Smoking Status: quit     Smoking Cessation Counseling: yes     Packs/Day: 2.0     Pack years: 4-6 A  DAY  Caffeine-Diet-Exercise     Does Patient Exercise: yes     Type of exercise: WALKING     Exercise (avg: min/session): 30-60     Times/week: 5-7  Problems Prior to Update: 1)  Chronic Pain Syndrome  (ICD-338.4) 2)  Elevated Bp Reading Without Dx Hypertension  (ICD-796.2) 3)  Shoulder Pain, Left, Chronic  (ICD-719.41) 4)  Back Pain  (ICD-724.5) 5)  Steroid Use, Long Term  (ICD-V58.65) 6)  Callus, Foot  (ICD-700) 7)  Hyperlipidemia  (ICD-272.4) 8)  ? of Vertigo  (ICD-780.4) 9)  Tobacco Abuse  (ICD-305.1) 10)  ? of Tia  (ICD-435.9) 11)  Rheumatoid Arthritis  (ICD-714.0) 12)  Systemic Lupus Erythematosus  (ICD-710.0)  Medications Prior to Update: 1)  Humira 40 Mg/0.13ml Kit (Adalimumab) .... Inject 40 Mg Subcutaneously Every Other Week On Monday 2)  Methotrexate 2.5 Mg Tabs (Methotrexate Sodium) .... Take Ten Tablets By Mouth Every Monday. 3)  Prednisone 20 Mg Tabs (Prednisone) .Marland KitchenMarland KitchenMarland Kitchen  Take 1 Tablet By Mouth Once A Day 4)  Aspirin 81 Mg Tbec (Aspirin) .... Take 1 Tablet By Mouth Once A Day 5)  Zocor 20 Mg Tabs (Simvastatin) .... Take 1 Tablet Once Daily At Bedtime 6)  Oscal 500/200 D-3 500-200 Mg-Unit Tabs (Calcium-Vitamin D) .... Take 1 Tablet Twice Daily 7)  Morphine Sulfate Cr 15 Mg Xr12h-Tab (Morphine Sulfate) .... Take 1 Tablet By Mouth Twice Daily 8)  Oxycodone-Acetaminophen 5-325 Mg Tabs (Oxycodone-Acetaminophen) .... Take 1 Tablet By Mouth Every 6 Hours As Needed For Severe Pain 9)  Ambien 10 Mg Tabs (Zolpidem Tartrate) .... Take 1 Tab By Mouth At Bedtime  Current Medications (verified): 1)  Humira 40 Mg/0.16ml Kit (Adalimumab) .... Inject 40 Mg Subcutaneously Every Other Week On Monday 2)  Methotrexate 2.5 Mg Tabs (Methotrexate Sodium) .... Take Ten Tablets By Mouth Every Monday. 3)  Prednisone 20 Mg Tabs (Prednisone) .... Take 1 Tablet By Mouth Once A Day 4)  Aspirin 81 Mg Tbec (Aspirin) .... Take 1 Tablet By Mouth Once A Day 5)  Zocor 20 Mg Tabs (Simvastatin) .... Take 1  Tablet Once Daily At Bedtime 6)  Oscal 500/200 D-3 500-200 Mg-Unit Tabs (Calcium-Vitamin D) .... Take 1 Tablet Twice Daily 7)  Morphine Sulfate Cr 15 Mg Xr12h-Tab (Morphine Sulfate) .... Take 1 Tablet By Mouth Twice Daily 8)  Oxycodone-Acetaminophen 5-325 Mg Tabs (Oxycodone-Acetaminophen) .... Take 1 Tablet By Mouth Every 6 Hours As Needed For Severe Pain 9)  Ambien 10 Mg Tabs (Zolpidem Tartrate) .... Take 1 Tab By Mouth At Bedtime  Allergies (verified): No Known Drug Allergies  Past History:  Past Medical History: Last updated: 11/26/2009 Lupus RA chronic pain  Family History: Last updated: 08/11/2009 no significant medical family history  Social History: Last updated: 11/17/2009 Single Alcohol use-no Drug use-no Regular exercise-yes  Risk Factors: Alcohol Use: 0 (04/04/2010) Exercise: yes (04/04/2010)  Risk Factors: Smoking Status: quit (04/04/2010) Packs/Day: 2.0 (04/04/2010)  Family History: Reviewed history from 08/11/2009 and no changes required. no significant medical family history  Social History: Reviewed history from 11/17/2009 and no changes required. Single Alcohol use-no Drug use-no Regular exercise-yes Does Patient Exercise:  yes  Review of Systems       per HPI  Physical Exam  General:  Well-developed,well-nourished,in no acute distress; alert,appropriate and cooperative throughout examination Head:  Normocephalic and atraumatic without obvious abnormalities. No apparent alopecia or balding. Eyes:  pupils reactive to light, pupils react to accomodation, corneas and lenses clear, and no injection.  vision grossly intact, pupils equal, pupils round, pupils reactive to light, pupils react to accomodation, corneas and lenses clear, and no injection.   Mouth:  Oral mucosa and oropharynx without lesions or exudates.  poor dentition.  poor dentition.   Neck:  No deformities, masses, or tenderness noted. Lungs:  Normal respiratory effort, chest  expands symmetrically. Lungs are clear to auscultation, no crackles or wheezes. Heart:  Normal rate and regular rhythm. S1 and S2 normal without gallop, murmur, click, rub or other extra sounds. Abdomen:  Bowel sounds positive,abdomen soft and non-tender without masses, organomegaly or hernias noted. Slightly hard in LLQ, non tender, no guardng Extremities:  No clubbing, cyanosis, edema, or deformity noted with normal full range of motion of all joints.   Neurologic:  No cranial nerve deficits noted. Station and gait are normal. Plantar reflexes are down-going bilaterally. DTRs are symmetrical throughout. Sensory, motor and coordinative functions appear intact. Skin:  Intact without suspicious lesions or rashes, redness over the cheecks, rosecea type (  unchanged) Cervical Nodes:  No lymphadenopathy noted Psych:  Cognition and judgment appear intact. Alert and cooperative with normal attention span and concentration. No apparent delusions, illusions, hallucinations   Impression & Recommendations:  Problem # 1:  LIVER FUNCTION TESTS, ABNORMAL, HX OF (ICD-V12.2)  I just spoke with Dr. Dareen Piano, [facility] (rheumatologist) and he told me that pt's LFT last week were as follows AP 230, ALT 504, AST 198, Alb 3.6 and pt was told to stop taking tylenol products andmetothrexate with follow up in one week. I would peruse this further and check viral hepatitis panel, discuss with attending RU[facility] [place]. Certainly these nu,bers can be secondary to methotrexate but pt has been on it for a while. Will stop tylenol products for now.   Orders: T-Comprehensive Metabolic Panel [id number]) T-Hepatitis Profile Acute ([id number])  Problem # 2:  ELEVATED BP READING WITHOUT DX HYPERTENSION (ICD-796.2)  Remains at goal, will continue to check it and encourage lifestyle  BP today: 128/86 Prior BP: 125/85 (02/22/2010)  Labs Reviewed: Chol: 165 (10/08/2009)   HDL: 52 (10/08/2009)   LDL: 86 (10/08/2009)   TG: 134  (10/08/2009)  Instructed in low sodium diet (DASH Handout) and behavior modification.    Orders: T-Comprehensive Metabolic Panel [id number]) T-Hepatitis Profile Acute ([id number])  Complete Medication List: 1)  Humira 40 Mg/0.57ml Kit (Adalimumab) .... Inject 40 mg subcutaneously every other week on monday 2)  Prednisone 20 Mg Tabs (Prednisone) .... Take 1 tablet by mouth once a day 3)  Zocor 20 Mg Tabs (Simvastatin) .... Take 1 tablet once daily at bedtime 4)  Oscal 500/200 D-3 500-200 Mg-unit Tabs (Calcium-vitamin d) .... Take 1 tablet twice daily 5)  Morphine Sulfate Cr 15 Mg Xr12h-tab (Morphine sulfate) .... Take 1 tablet by mouth twice daily 6)  Ambien 10 Mg Tabs (Zolpidem tartrate) .... Take 1 tab by mouth at bedtime  Patient Instructions: 1)  Please schedule a follow-up appointment in 2 weeks. 2)  Stop Smoking Tips: Choose a Quit date. Cut down before the Quit date. decide what you will do as a substitute when you feel the urge to smoke(gum,toothpick,exercise). 3)  It is important that you exercise regularly at least 20 minutes 5 times a week. If you develop chest pain, have severe difficulty breathing, or feel very tired , stop exercising immediately and seek medical attention. Process Orders Check Orders Results:     [facility]: Order checked:     [id number] -- T-Hepatitis Profile Acute -- ABN required due to diagnosis (CPT: [phone]) Order queued for requisitioning for [facility]: April 04, 2010 2:40 PM  Tests Sent for requisitioning (April 04, 2010 2:40 PM):     04/04/2010: [facility] -- T-Comprehensive Metabolic Panel [[id number]] (signed)     04/04/2010: [facility] -- T-Hepatitis Profile Acute [phone] (signed)     Prevention & Chronic Care Immunizations   Influenza vaccine: Not documented   Influenza vaccine deferral: Not indicated  (10/08/2009)    Tetanus booster: 11/17/2009: Td   Td booster deferral: Not  indicated  (10/08/2009)    Pneumococcal vaccine: Not documented  Other Screening   Smoking status: quit  (04/04/2010)  Lipids   Total Cholesterol: 165  (10/08/2009)   LDL: 86  (10/08/2009)   LDL Direct: Not documented   HDL: 52  (10/08/2009)   Triglycerides: 134  (10/08/2009)    SGOT (AST): 13  (10/08/2009)   BMP action: Ordered   SGPT (ALT): 33  (10/08/2009) CMP ordered    Alkaline phosphatase: 77  (10/08/2009)  Total bilirubin: 0.5  (10/08/2009)    Lipid flowsheet reviewed?: Yes   Progress toward LDL goal: At goal  Self-Management Support :   Personal Goals (by the next clinic visit) :      Personal LDL goal: 100  (10/18/2009)    Patient will work on the following items until the next clinic visit to reach self-care goals:     Medications and monitoring: take my medicines every day  (04/04/2010)     Eating: drink diet soda or water instead of juice or soda, eat more vegetables, use fresh or frozen vegetables, eat foods that are low in salt, eat baked foods instead of fried foods, eat fruit for snacks and desserts, limit or avoid alcohol  (04/04/2010)     Activity: take a 30 minute walk every day  (04/04/2010)    Lipid self-management support: Resources for patients handout  (04/04/2010)        Resource handout printed.  Appended Document: problems/DS Will hold ZOCOR, ASPIRIN, Methotrexate, hydrocodone - tylenol until LFT's back at baseline.   Patient Instructions: 1)  Please schedule a follow-up appointment in 2 weeks.

## 2010-07-28 NOTE — Letter (Signed)
Summary: OXYCODONE/MEDICATION CONTRACT  OXYCODONE/MEDICATION CONTRACT   Imported By: Margie Billet 11/29/2009 10:49:28  _____________________________________________________________________  External Attachment:    Type:   Image     Comment:   External Document

## 2010-07-28 NOTE — Progress Notes (Signed)
Summary: Refill/gh  Phone Note Refill Request Message from:  Patient on April 25, 2010 9:42 AM  Refills Requested: Medication #1:  PERCOCET 5-325 MG TABS 1 tab every 8 hrs as neede for pain.  Medication #2:  AMBIEN 10 MG TABS Take 1 tab by mouth at bedtime Has a prescription for Morphine that he is asking to get refilled.  Got it it about 30 days ago and has not used.   Method Requested: Pick up at Office Initial call taken by: Angelina Ok RN,  April 25, 2010 9:42 AM  Follow-up for Phone Call        I just gave him percocet not too long ago, not sure why he wants more. But I also don't know who gave him morphine, I am not comfortable perscribing it, do you know what he needs it for. Please let me know thank you iskra Follow-up by: Mliss Sax MD,  April 25, 2010 3:25 PM

## 2010-07-28 NOTE — Assessment & Plan Note (Signed)
Summary: shoulder pain - chronic/gg   Vital Signs:  Patient profile:   50 year old male Height:      71 inches (180.34 cm) Weight:      169.3 pounds (76.95 kg) BMI:     23.70 Temp:     98.1 degrees F (36.72 degrees C) oral Pulse rate:   99 / minute BP sitting:   147 / 101  (left arm) Cuff size:   large  Vitals Entered By: Cynda Familia Duncan Dull) (February 16, 2010 8:50 AM) CC: pt c/o left shoulder pain off and on since 1980's since-pain worse and more constant over the last 2 weeks Is Patient Diabetic? No Pain Assessment Patient in pain? yes     Location: left shoulder Intensity: 8 Type: sharp Onset of pain  chronic off and on for "years" worse over the last 2wks Nutritional Status BMI of 19 -24 = normal  Have you ever been in a relationship where you felt threatened, hurt or afraid?No   Does patient need assistance? Functional Status Self care Ambulation Normal   CC:  pt c/o left shoulder pain off and on since 1980's since-pain worse and more constant over the last 2 weeks.  History of Present Illness: pt is 50 yo m presents to [facility] c/o increased left shoulder pain for past 2 weeks, pt has had shoulder pain since 1980, had minor accident and since then pain has been there, but now is worse. denies injury, worked heavy lifting for past 4 months, pain worse with movement, 9/10. pt went to the ED yesterday and was give IV narcotics, and nothing else. pt has complete ROM in his left shoulder.  Patient is feeling well and denies CP, abdominal pain, nausea, vomiting, HA's, palpitations, blurred vision. fever, chills, diarrhea, constipation or SOB.   MRI from 2006,  Mild tendinopathy of the distal infraspinatus tendon with an underlying humeral head cyst.  No evidence of pronounced disease or cuff tear.  The other components of the rotator cuff appear normal.  40mg  solumedrol IA  Preventive Screening-Counseling & Management  Alcohol-Tobacco     Alcohol drinks/day: 0  Smoking Status: quit     Smoking Cessation Counseling: yes     Packs/Day: 2.0     Pack years: 4-6 A DAY  Allergies (verified): No Known Drug Allergies  Review of Systems       As per HPI  Physical Exam  General:  Well-developed,well-nourished,in no acute distress; alert,appropriate and cooperative throughout examination Lungs:  normal respiratory effort and no accessory muscle use.   Heart:  Normal rate and regular rhythm. S1 and S2 normal without gallop, murmur, click, rub or other extra sounds. Abdomen:  Bowel sounds positive,abdomen soft and non-tender without masses, organomegaly or hernias noted. Msk:  Left shoulder tenderness with limited ROM.  Neurologic:  alert & oriented X3 and gait normal.     Impression & Recommendations:  Problem # 1:  SHOULDER PAIN, LEFT, CHRONIC (ICD-719.41) Assessment Deteriorated Pt was at ED yesterday, was given Diluadid, note that the patient has broken his pain contract by obtaining opiates from multiple sources over past few months, it is up to his PCP to d/c or continue narcs.  For the pain he has had an MRI in 2006 which was Normal. Today will get a repeat MRI, sent to OT as the pt does do heavy lifting in his job, will also do articular steroid injection.   Procedure: left shoulder intra-articluar injection of solumedrol 40mg , consent obtained, time out done.  sucessful without complications except for mild pain at injection site that was noted by patient.   His updated medication list for this problem includes:    Oxycodone Hcl 10 Mg Tabs (Oxycodone hcl) .[name]... Take 1 tablet by mouth every 6 hours as needed for pain    Aspirin 81 Mg Tbec (Aspirin) .[name]... Take 1 tablet by mouth once a day  Orders: Occupational Therapy (OT) MRI (MRI) Joint Aspirate / Injection, Intermediate ([id number])  Problem # 2:  TOBACCO ABUSE (ICD-305.1) Assessment: Comment Only  Encouraged smoking cessation and discussed different methods for smoking cessation.    Problem # 3:  HYPERLIPIDEMIA (ICD-272.4) Assessment: Comment Only  His updated medication list for this problem includes:    Zocor 20 Mg Tabs (Simvastatin) .[name]... Take 1 tablet once daily at bedtime  Labs Reviewed: SGOT: 13 (10/08/2009)   SGPT: 33 (10/08/2009)   HDL:52 (10/08/2009)  LDL:86 (10/08/2009)  Chol:165 (10/08/2009)  Trig:134 (10/08/2009)  Problem # 4:  ELEVATED BP READING WITHOUT DX HYPERTENSION (ICD-796.2) Assessment: New pt has elevated BP, this maybe 2/2 to pain, will recheck on next visit, pt may need BP meds for HTN if remains hypertensive.   BP today: 147/101 Prior BP: 120/83 (01/11/2010)  Labs Reviewed: Chol: 165 (10/08/2009)   HDL: 52 (10/08/2009)   LDL: 86 (10/08/2009)   TG: 134 (10/08/2009)  Instructed in low sodium diet (DASH Handout) and behavior modification.    Problem # 5:  RHEUMATOID ARTHRITIS (ICD-714.0) Assessment: Comment Only stable. no signs of worsening disease.   Complete Medication List: 1)  Humira 40 Mg/0.66ml Kit (Adalimumab) .... Inject 40 mg subcutaneously every other week on monday 2)  Oxycodone Hcl 10 Mg Tabs (Oxycodone hcl) .... Take 1 tablet by mouth every 6 hours as needed for pain 3)  Methotrexate 2.5 Mg Tabs (Methotrexate sodium) .... Take ten tablets by mouth every monday. 4)  Prednisone 20 Mg Tabs (Prednisone) .... Take 1 tablet by mouth once a day 5)  Meclizine Hcl 25 Mg Tabs (Meclizine hcl) .... Take 1 tablet by mouth every 6 hours as needed for dizziness 6)  Aspirin 81 Mg Tbec (Aspirin) .... Take 1 tablet by mouth once a day 7)  Zocor 20 Mg Tabs (Simvastatin) .... Take 1 tablet once daily at bedtime 8)  Oscal 500/200 D-3 500-200 Mg-unit Tabs (Calcium-vitamin d) .... Take 1 tablet twice daily 9)  Prednisone 20 Mg Tabs (Prednisone) .... Take 80 mg tab once daily for 4 days, then 60mg  tab for 4 days, then 40 mg tab for 4 days, then 20mg  once daily 10)  Ambien 10 Mg Tabs (Zolpidem tartrate) .... Take 1 tab by mouth at  bedtime  Patient Instructions: 1)  Please schedule a follow-up appointment in 1 month. Prescriptions: ATIVAN 2 MG TABS (LORAZEPAM) take one tablet by mouth 10 minutes before going for the MRI scan  #2 x 0   Entered and Authorized by:   Darnelle Maffucci MD   Signed by:   Darnelle Maffucci MD on 02/16/2010   Method used:   Print then Give to Patient   RxID:   [id number]    Prevention & Chronic Care Immunizations   Influenza vaccine: Not documented   Influenza vaccine deferral: Not indicated  (10/08/2009)    Tetanus booster: 11/17/2009: Td   Td booster deferral: Not indicated  (10/08/2009)    Pneumococcal vaccine: Not documented  Other Screening   Smoking status: quit  (02/16/2010)  Lipids   Total Cholesterol: 165  (10/08/2009)   LDL: 86  (  10/08/2009)   LDL Direct: Not documented   HDL: 52  (10/08/2009)   Triglycerides: 134  (10/08/2009)    SGOT (AST): 13  (10/08/2009)   BMP action: Ordered   SGPT (ALT): 33  (10/08/2009)   Alkaline phosphatase: 77  (10/08/2009)   Total bilirubin: 0.5  (10/08/2009)  Self-Management Support :   Personal Goals (by the next clinic visit) :      Personal LDL goal: 100  (10/18/2009)    Patient will work on the following items until the next clinic visit to reach self-care goals:     Medications and monitoring: take my medicines every day  (02/16/2010)     Eating: eat foods that are low in salt, eat baked foods instead of fried foods  (02/16/2010)     Activity: take a 30 minute walk every day  (01/11/2010)    Lipid self-management support: Resources for patients handout  (02/16/2010)        Resource handout printed.

## 2010-07-28 NOTE — Medication Information (Signed)
Summary: RX HISTORY REPORT  RX HISTORY REPORT   Imported By: Louretta Parma 03/02/2010 16:04:34  _____________________________________________________________________  External Attachment:    Type:   Image     Comment:   External Document

## 2010-07-28 NOTE — Medication Information (Signed)
Summary: PERCOCET  PERCOCET   Imported By: Margie Billet 05/31/2010 09:53:49  _____________________________________________________________________  External Attachment:    Type:   Image     Comment:   External Document

## 2010-07-28 NOTE — Medication Information (Signed)
Summary: RX HISTORY REPORT  RX HISTORY REPORT   Imported By: Margie Billet 07/05/2010 14:22:26  _____________________________________________________________________  External Attachment:    Type:   Image     Comment:   External Document

## 2010-07-29 NOTE — Medication Information (Signed)
Summary: OXYCODONE/MORPHINE SULFATE  OXYCODONE/MORPHINE SULFATE   Imported By: Margie Billet 03/29/2010 15:04:28  _____________________________________________________________________  External Attachment:    Type:   Image     Comment:   External Document

## 2010-08-02 ENCOUNTER — Other Ambulatory Visit: Payer: Self-pay | Admitting: *Deleted

## 2010-08-08 ENCOUNTER — Encounter: Payer: Self-pay | Admitting: Internal Medicine

## 2010-08-19 ENCOUNTER — Telehealth: Payer: Self-pay | Admitting: *Deleted

## 2010-08-19 NOTE — Telephone Encounter (Signed)
Pt's SO calls to ask for a script for percocet for pt, states he needs something for pain. She states he has an appt 3/9 at pain clinic.

## 2010-08-25 ENCOUNTER — Other Ambulatory Visit: Payer: Self-pay | Admitting: Internal Medicine

## 2010-08-29 ENCOUNTER — Other Ambulatory Visit: Payer: Self-pay | Admitting: *Deleted

## 2010-08-30 ENCOUNTER — Other Ambulatory Visit: Payer: Medicare Other

## 2010-08-30 DIAGNOSIS — M549 Dorsalgia, unspecified: Secondary | ICD-10-CM

## 2010-08-30 MED ORDER — OXYCODONE-ACETAMINOPHEN 5-325 MG PO TABS: 1.0000 | ORAL_TABLET | Freq: Three times a day (TID) | ORAL | Status: AC | PRN

## 2010-08-30 NOTE — Telephone Encounter (Signed)
Please arrange for a UDS.  Also tell patient he must keep app't this week with Dr. Wynn Banker.

## 2010-08-30 NOTE — Telephone Encounter (Signed)
uds obtained, pt is agreeable that he will keep pain clinic appt, pt given script

## 2010-08-31 LAB — DRUGS OF ABUSE SCREEN W/O ALC, ROUTINE URINE
Amphetamine Screen, Ur: NEGATIVE
Benzodiazepines.: POSITIVE — AB
Marijuana Metabolite: NEGATIVE
Methadone: NEGATIVE
Phencyclidine (PCP): NEGATIVE

## 2010-09-02 ENCOUNTER — Encounter: Payer: Medicare Other | Attending: Physical Medicine & Rehabilitation

## 2010-09-02 ENCOUNTER — Ambulatory Visit: Payer: Medicare Other | Admitting: Physical Medicine & Rehabilitation

## 2010-09-02 DIAGNOSIS — M75100 Unspecified rotator cuff tear or rupture of unspecified shoulder, not specified as traumatic: Secondary | ICD-10-CM | POA: Insufficient documentation

## 2010-09-02 DIAGNOSIS — M069 Rheumatoid arthritis, unspecified: Secondary | ICD-10-CM

## 2010-09-02 DIAGNOSIS — IMO0002 Reserved for concepts with insufficient information to code with codable children: Secondary | ICD-10-CM | POA: Insufficient documentation

## 2010-09-02 LAB — BENZODIAZEPINE, QUANTITATIVE, URINE
Flurazepam Metabolite: NEGATIVE NG/ML
Lorazepam: NEGATIVE NG/ML
Nordiazepam GC/MS Conf: NEGATIVE NG/ML
Oxazepam GC/MS Conf: NEGATIVE NG/ML
Temazapam: NEGATIVE NG/ML

## 2010-09-04 LAB — OPIATE, QUANTITATIVE, URINE
Codeine Urine: NEGATIVE ng/mL
Hydrocodone: NEGATIVE ng/mL
Hydromorphone - Total: NEGATIVE ng/mL
Morphine Urine: NEGATIVE ng/mL
Oxycodone - Total: 348 ng/mL — ABNORMAL HIGH
Oxymorphone: 4969 ng/mL — ABNORMAL HIGH

## 2010-09-08 LAB — URINALYSIS, ROUTINE W REFLEX MICROSCOPIC
Glucose, UA: NEGATIVE mg/dL
Ketones, ur: NEGATIVE mg/dL
Protein, ur: NEGATIVE mg/dL

## 2010-09-08 LAB — CBC
HCT: 42.9 % (ref 39.0–52.0)
MCHC: 31.2 g/dL (ref 30.0–36.0)
MCV: 90.7 fL (ref 78.0–100.0)
RDW: 14.8 % (ref 11.5–15.5)

## 2010-09-08 LAB — POCT CARDIAC MARKERS
Myoglobin, poc: 36.8 ng/mL (ref 12–200)
Troponin i, poc: <0.05 ng/mL (ref 0.00–0.09)

## 2010-09-08 LAB — DIFFERENTIAL
Lymphs Abs: 1.9 10*3/uL (ref 0.7–4.0)
Monocytes Relative: 6 % (ref 3–12)
Neutro Abs: 7.2 10*3/uL (ref 1.7–7.7)
Neutrophils Relative %: 73 % (ref 43–77)

## 2010-09-08 LAB — COMPREHENSIVE METABOLIC PANEL
BUN: 10 mg/dL (ref 6–23)
Calcium: 8.8 mg/dL (ref 8.4–10.5)
Glucose, Bld: 107 mg/dL — ABNORMAL HIGH (ref 70–99)
Sodium: 144 mEq/L (ref 135–145)
Total Protein: 6.2 g/dL (ref 6.0–8.3)

## 2010-09-12 LAB — CK TOTAL AND CKMB (NOT AT ARMC)
CK, MB: 1.6 ng/mL (ref 0.3–4.0)
Relative Index: INVALID (ref 0.0–2.5)

## 2010-09-12 LAB — CBC
HCT: 37.1 % — ABNORMAL LOW (ref 39.0–52.0)
HCT: 38.7 % — ABNORMAL LOW (ref 39.0–52.0)
Hemoglobin: 13.1 g/dL (ref 13.0–17.0)
MCHC: 34 g/dL (ref 30.0–36.0)
MCV: 90.1 fL (ref 78.0–100.0)
MCV: 91.8 fL (ref 78.0–100.0)
Platelets: 153 10*3/uL (ref 150–400)
Platelets: 154 10*3/uL (ref 150–400)
RBC: 4.12 MIL/uL — ABNORMAL LOW (ref 4.22–5.81)
RBC: 4.21 MIL/uL — ABNORMAL LOW (ref 4.22–5.81)
RDW: 15.5 % (ref 11.5–15.5)
WBC: 8.4 10*3/uL (ref 4.0–10.5)
WBC: 9.4 10*3/uL (ref 4.0–10.5)

## 2010-09-12 LAB — APTT
aPTT: 34 seconds (ref 24–37)
aPTT: 34 seconds (ref 24–37)

## 2010-09-12 LAB — COMPREHENSIVE METABOLIC PANEL
AST: 17 U/L (ref 0–37)
Albumin: 3.3 g/dL — ABNORMAL LOW (ref 3.5–5.2)
Albumin: 3.6 g/dL (ref 3.5–5.2)
BUN: 9 mg/dL (ref 6–23)
CO2: 27 mEq/L (ref 19–32)
Calcium: 8.9 mg/dL (ref 8.4–10.5)
Chloride: 107 mEq/L (ref 96–112)
Creatinine, Ser: 0.7 mg/dL (ref 0.4–1.5)
Creatinine, Ser: 0.81 mg/dL (ref 0.4–1.5)
GFR calc Af Amer: >60 mL/min (ref >60)
GFR calc non Af Amer: >60 mL/min (ref >60)
Total Bilirubin: 0.5 mg/dL (ref 0.3–1.2)

## 2010-09-12 LAB — POCT CARDIAC MARKERS: Troponin i, poc: <0.05 ng/mL (ref 0.00–0.09)

## 2010-09-12 LAB — DIFFERENTIAL
Eosinophils Relative: 1 % (ref 0–5)
Lymphocytes Relative: 17 % (ref 12–46)
Lymphs Abs: 1.6 10*3/uL (ref 0.7–4.0)
Neutro Abs: 7.3 10*3/uL (ref 1.7–7.7)

## 2010-09-12 LAB — RAPID URINE DRUG SCREEN, HOSP PERFORMED
Amphetamines: NOT DETECTED
Benzodiazepines: POSITIVE — AB
Tetrahydrocannabinol: NOT DETECTED

## 2010-09-12 LAB — BASIC METABOLIC PANEL
Chloride: 110 mEq/L (ref 96–112)
GFR calc Af Amer: >60 mL/min (ref >60)
GFR calc non Af Amer: >60 mL/min (ref >60)
Potassium: 3.5 mEq/L (ref 3.5–5.1)
Sodium: 146 mEq/L — ABNORMAL HIGH (ref 135–145)

## 2010-09-12 LAB — ACETAMINOPHEN LEVEL: Acetaminophen (Tylenol), Serum: <10 ug/mL — ABNORMAL LOW (ref 10–30)

## 2010-09-12 LAB — URINALYSIS, ROUTINE W REFLEX MICROSCOPIC
Bilirubin Urine: NEGATIVE
Glucose, UA: NEGATIVE mg/dL
Hgb urine dipstick: NEGATIVE
Ketones, ur: NEGATIVE mg/dL
pH: 6 (ref 5.0–8.0)

## 2010-09-12 LAB — HEMOGLOBIN A1C
Hgb A1c MFr Bld: 5.4 % (ref 4.6–6.1)
Mean Plasma Glucose: 108 mg/dL

## 2010-09-12 LAB — TROPONIN I: Troponin I: 0.02 ng/mL (ref 0.00–0.06)

## 2010-09-12 LAB — LIPID PANEL
LDL Cholesterol: 108 mg/dL — ABNORMAL HIGH (ref 0–99)
VLDL: 17 mg/dL (ref 0–40)

## 2010-09-12 LAB — PROTIME-INR
INR: 0.93 (ref 0.00–1.49)
INR: 0.97 (ref 0.00–1.49)
Prothrombin Time: 12.4 seconds (ref 11.6–15.2)

## 2010-09-12 LAB — ETHANOL: Alcohol, Ethyl (B): <5 mg/dL (ref 0–10)

## 2010-09-15 ENCOUNTER — Ambulatory Visit
Payer: Medicare Other | Attending: Physical Medicine & Rehabilitation | Admitting: Rehabilitative and Restorative Service Providers"

## 2010-09-15 DIAGNOSIS — M6281 Muscle weakness (generalized): Secondary | ICD-10-CM | POA: Insufficient documentation

## 2010-09-15 DIAGNOSIS — IMO0001 Reserved for inherently not codable concepts without codable children: Secondary | ICD-10-CM | POA: Insufficient documentation

## 2010-09-15 DIAGNOSIS — R269 Unspecified abnormalities of gait and mobility: Secondary | ICD-10-CM | POA: Insufficient documentation

## 2010-09-15 DIAGNOSIS — I69998 Other sequelae following unspecified cerebrovascular disease: Secondary | ICD-10-CM | POA: Insufficient documentation

## 2010-09-16 NOTE — Progress Notes (Signed)
This is a 50 year old male with history of rheumatoid arthritis.  I do not have any lab work on him.  He has been followed by the Internal Medicine Clinic at [facility].  He does have chief complaints of shoulder pain, back pain, and foot and ankle pain.  He does have a history of fall in 2002 resulting in a right ulnar fracture, status post ORIF.  His left shoulder had x-rays taken in 2006.  He had an MRI of the left shoulder showing mild tendinopathy involving the distal infraspinatus tendon.  In terms of his low back, he had x-rays taken showing degenerative changes in lumbar facets.  Pelvic films showing no evidence of significant OA.  He has had a CT of the cervical spine in last year showing no evidence of fracture or subluxation.  Dedicated lumbar spine films showing a grade 1 anterolisthesis L5 on S1.  The patient had been followed by Dr. Chase Picket, was on Humira and methotrexate in the past.  He also has a history of lupus.  He currently is on 20 mg of prednisone.  The patient had a hospitalization for TIA approximately 1 year ago, but no other recent hospitalizations.  He has been on oxycodone with consistent urine drug screen monitoring proper usage.  There was some concern about elevated liver function tests and it was noted he may have been on methotrexate at that time, although I do see notes indicating abnormal LFTs and he was not on methotrexate.  PAST SURGICAL HISTORY:  In addition to his ORIF of the right ulna, he also had some type of Achilles tendon surgery removing what sounds to be rheumatoid nodules by Dr. Lestine Box several years ago.  His functional status is independent.  He climbs steps.  He drives.  He was last employed in 2011, has not worked this year thus far.  His pain is improved with exercise and medication.  He has had some weight gain over the last several months.  SOCIAL HISTORY:  Single, lives with his girlfriend, smokes half pack  per day.  PHYSICAL EXAMINATION:  VITAL SIGNS:  His blood pressure is 136/95, pulse 87, respirations 18, and sat 97% on room air. GENERAL:  No acute distress.  Mood and affect appropriate.  He does have a positive impingement sign. EXTREMITIES:  Left shoulder and no evidence of cuff atrophy.  He has normal strength in the upper extremity, normal deep tendon reflexes in the upper extremities, normal sensation in the upper extremities and lower extremities.  He has normal strength and sensation.  He has no evidence of joint deformities in the knees and hands or the elbows.  He does have multiple nodules on his Achilles tendon as well as over the right lateral foot at the insertion of the peroneal tendon.  IMPRESSION: 1. Chronic pain syndrome, multifactorial.  He does not have much in     terms of psychological stressors. 2. Shoulder pain:  Left shoulder appears to be a subacromial bursitis. 3. Back pain, likely multifactorial:  He does have evidence of     spondylolisthesis which is degenerative in nature, likely facet     arthropathy related.  We will send him with therapy for this. 4. Foot and ankle pain:  He does have rheumatoid nodules.  It sounds     like he had some type of surgery to debride the Achilles tendon by     Dr. Lestine Box.  I would like him to see Rheumatology first to see if  there is any medication changes that would be appropriate and may     need to follow up with foot and ankle surgery to get reevaluated.  I discussed with the patient and he agrees with plan.  We will also check urine drug screen if his urine drug screen looks appropriate.  We can take over his narcotic analgesic prescription.  I discussed with the patient and agrees with plan.  We will inject his left shoulder today as well.     Erick Colace, M.D. Electronically Signed    AEK/MedQ D:  09/02/2010 17:28:49  T:  09/02/2010 21:20:37  Job #:  [id number]  cc:   Zenovia Jordan, MD Fax:  [phone]

## 2010-10-14 ENCOUNTER — Ambulatory Visit: Payer: Medicare Other | Admitting: Physical Medicine & Rehabilitation

## 2010-11-07 ENCOUNTER — Ambulatory Visit: Payer: Medicare Other | Admitting: Physical Medicine & Rehabilitation

## 2010-11-07 ENCOUNTER — Encounter: Payer: Medicare Other | Attending: Physical Medicine & Rehabilitation

## 2010-11-07 DIAGNOSIS — IMO0002 Reserved for concepts with insufficient information to code with codable children: Secondary | ICD-10-CM | POA: Insufficient documentation

## 2010-11-07 DIAGNOSIS — M069 Rheumatoid arthritis, unspecified: Secondary | ICD-10-CM

## 2010-11-07 DIAGNOSIS — M67919 Unspecified disorder of synovium and tendon, unspecified shoulder: Secondary | ICD-10-CM

## 2010-11-07 DIAGNOSIS — M719 Bursopathy, unspecified: Secondary | ICD-10-CM

## 2010-11-07 DIAGNOSIS — M75100 Unspecified rotator cuff tear or rupture of unspecified shoulder, not specified as traumatic: Secondary | ICD-10-CM | POA: Insufficient documentation

## 2010-11-08 NOTE — Procedures (Signed)
NAME:  ZAKAR, BROSCH NO.:  [id number]  MEDICAL RECORD NO.:  [id number]           PATIENT TYPE:  O  LOCATION:  [facility]                          FACILITY:  [facility]  PHYSICIAN:  Erick Colace, M.D.DATE OF BIRTH:  [birth year]  DATE OF PROCEDURE:  11/07/2010 DATE OF DISCHARGE:                              OPERATIVE REPORT  REASON FOR VISIT:  Left shoulder pain.  PROCEDURE:  Left subacromial bursa injection.  INDICATION:  Rheumatoid arthritis with shoulder pain which interferes with overhead activity.  He has had excellent relief with previous shoulder injection 2 months ago, here for repeat.  Informed consent was obtained after describing risks and benefits of the procedure with the patient.  These include bleeding, bruising, and infection.  He elects to proceed and has given written consent.  The patient was placed in a seated position on exam table.  Posterior lateral aspect marked, prepped with chlorhexidine, entered with a 25- gauge inch and half needle after spraying the area with fluoromethane spray.  Injection of 1 mL of 40 mg/mL Depo-Medrol with 3 mL of 1% lidocaine was performed after negative drawback for blood.  The patient tolerated the procedure well.  Postprocedure instructions given and will return in 3 months for repeat.     Erick Colace, M.D. Electronically Signed    AEK/MEDQ  D:  11/07/2010 15:09:00  T:  11/08/2010 01:01:04  Job:  [id number]

## 2010-11-11 NOTE — Op Note (Signed)
NAME:  [name] [name], [name] [name] NO.:  [id number]   MEDICAL RECORD NO.:  [id number]                   PATIENT TYPE:  AMB   LOCATION:  DAY                                  FACILITY:  [facility]   PHYSICIAN:  Dionne Ano. Everlene Other, M.D.         DATE OF BIRTH:  25-Dec-1960   DATE OF PROCEDURE:  07/22/2002  DATE OF DISCHARGE:                                 OPERATIVE REPORT   PREOPERATIVE DIAGNOSES:  1. 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  2. TFC tearing.   POSTOPERATIVE DIAGNOSES:  1. 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  2. TFC tearing.   PROCEDURES:  1. Ulna shortening osteotomy, right forearm, with Rahak plate system.  2. Arthroscopic TFC debridement.  3. Arthroscopic scapholunate interosseous ligament debridement.  4. Stretch radiography.   SURGEON:  Dionne Ano. Amanda Pea, M.D.   ASSISTANT:  Karie Chimera, P.A.-C.   COMPLICATIONS:  None.   ANESTHESIA:  General with preoperative axillary block.   TOURNIQUET TIME:  Less than an hour.   DRAINS:  None.   INDICATIONS FOR PROCEDURE:  The patient is a 50 year old male who presents  with the above-mentioned diagnoses.  I have counseled him to regards to  risks and benefits of surgery including risk of infection, bleeding,  anesthesia, damage to normal structures, and failure of the surgery to  accomplish its intended goals of relieving symptoms and restoring function.  With this in mind, the patient desires to proceed.  All questions have been  encouraged and answered preoperatively.   OPERATIVE PROCEDURE:  The patient was taken to the operative suite, an  axillary block previously placed with slight incomplete dose.  A supplement  general anesthetic was induced under the direction of Jill Side,  M.D.  The patient then had the arm appropriately prepped and draped with  Betadine scrub and paint.  Sterile field was secured and following this, the  patient had the arm  elevated and the tourniquet was insufflated to 250 mmHg.  I then made an incision along the ulnar aspect of the right forearm 2 cm  proximal to the radiocarpal joint.  I dissected through the skin and  subcutaneous sharply, down to the interval between the ECU and FCU.  I then  split this and then gently lifted the muscles off of the ulna.  Following  this, the osteotomy device was placed against the bone and I drilled holes 2  and 4 or according to protocol.  Following this, holes 1 and 3 were drilled.  This was done with a 2.5 drill bit for a 3.5 screw.  Following this, I then  appropriately placed the saw blade in the second, followed by the first  slot.  This provided an osteotomy.  This removed an oblique wafer of bone  from the ulna.  Following this, I removed the osteotomy apparatus and then  placed the compression device as well as  the plate which I prebent against  the bone.  I placed screws and holes 2 and 1 and following this, the sliding  device to allow for compression was placed allowing 4 mm more of screw  length to place the device on.  I then compressed the osteotomy site.  Following this, I placed a 2.7 screw across the oblique osteotomy site.  This was done in compression mode and absolutely compressed the site  wonderfully.  Prior to doing so, I did shape the joint line and noted that  the patient had a restoration to neutral to slide ulna minus variant.  This  tightened the TFC and helped the radiographic look immensely.  I was very  pleased with this.  Following this, I then proceeded to place holes 5 and 6  in the plate in standard AO technique with a 3.5 screw.  Of course, a 2.5  drill bit was used for this.  Following this, the compression apparatus was  removed and holes 3 and 4 were replaced with their correct screw length.  The osteotomy site sat wonderfully in a nice compressed state with an  interfaced screw across it and I was very pleased with this.  Once this  was  performed, I then took final x-rays in the operating room which I was very  pleased with.  Once this was done, we then performed stress x-rays to ensure  excellent position.  This was noted to be the case.  Following this, I then  deflated the tourniquet and closed the interval with 3-0 Vicryl about the  ECU and FCU followed by pulling through the subcutaneous with similar 3-0  Vicryl and closure of the skin edge with subcuticular Prolene.  At this  point, we then turned attention towards the wrist joint.  Traction tire was  set up with 10 pounds of countertraction placed.  This was done through  finger traps.  Following this, I then created a 3-4 working portal and a 6U  outflow as well as a 6R working portal.  I then, with sequential  arthroscopic instruments, performed a TFC debridement.  The patient had a  TFC tear off of the sigmoid notch region.  This was a central degenerative  tear.  I debrided this nicely with a combination arthroscopic shaver  including full radius and Kuda as well as a thermal ablator.  The patient  also underwent a partial synovectomy by feeling the aspect of the __________  .  Following this, the remainder of the radiocarpal joint was identified and  partial tearing when the scope went in.  Her osseous ligament was dressed  with arthroscopic wand for a debridement.  This was a membranous tear only.  The patient did have some subtle changes against the lunate.  However,  nothing end stage.  Once this was done, I then removed the arthroscopic  instruments.  These portal sites were closed with interrupted Prolene  suture.  He was then placed in a sugar tong splint, well molded to my  satisfaction.  He tolerated this well and there were no complications.  Once  this was done, the patient was then awakened and transferred to the recovery  room.  He will be admitted overnight for IV antibiotics, observation.  OT consult for damage control and range of motion and  general observation.   It has been a pleasure to participate in his care.  We look forward to  participating in his postoperative recovery.  Dionne Ano. Everlene Other, M.D.    Nash Mantis  D:  07/22/2002  T:  07/22/2002  Job:  [id number]

## 2011-01-24 ENCOUNTER — Ambulatory Visit: Payer: Medicare Other | Admitting: Physical Medicine & Rehabilitation

## 2011-01-24 ENCOUNTER — Encounter: Payer: Medicare Other | Attending: Physical Medicine & Rehabilitation

## 2011-01-24 DIAGNOSIS — M069 Rheumatoid arthritis, unspecified: Secondary | ICD-10-CM | POA: Insufficient documentation

## 2011-01-24 DIAGNOSIS — M75100 Unspecified rotator cuff tear or rupture of unspecified shoulder, not specified as traumatic: Secondary | ICD-10-CM | POA: Insufficient documentation

## 2011-01-24 DIAGNOSIS — IMO0002 Reserved for concepts with insufficient information to code with codable children: Secondary | ICD-10-CM | POA: Insufficient documentation

## 2011-01-30 ENCOUNTER — Ambulatory Visit: Payer: Medicare Other | Admitting: Physical Medicine & Rehabilitation

## 2011-02-01 ENCOUNTER — Emergency Department (HOSPITAL_COMMUNITY)
Admission: EM | Admit: 2011-02-01 | Discharge: 2011-02-01 | Disposition: A | Discharge status: Home / Self Care | Payer: Medicare HMO | Attending: Emergency Medicine | Admitting: Emergency Medicine

## 2011-02-01 ENCOUNTER — Emergency Department (HOSPITAL_COMMUNITY): Payer: Medicare HMO

## 2011-02-01 DIAGNOSIS — M069 Rheumatoid arthritis, unspecified: Secondary | ICD-10-CM | POA: Insufficient documentation

## 2011-02-01 DIAGNOSIS — M329 Systemic lupus erythematosus, unspecified: Secondary | ICD-10-CM | POA: Insufficient documentation

## 2011-02-01 DIAGNOSIS — E7800 Pure hypercholesterolemia, unspecified: Secondary | ICD-10-CM | POA: Insufficient documentation

## 2011-02-01 DIAGNOSIS — M542 Cervicalgia: Secondary | ICD-10-CM | POA: Insufficient documentation

## 2011-02-01 DIAGNOSIS — M25519 Pain in unspecified shoulder: Secondary | ICD-10-CM | POA: Insufficient documentation

## 2011-02-01 DIAGNOSIS — W11XXXA Fall on and from ladder, initial encounter: Secondary | ICD-10-CM | POA: Insufficient documentation

## 2011-02-01 DIAGNOSIS — IMO0002 Reserved for concepts with insufficient information to code with codable children: Secondary | ICD-10-CM | POA: Insufficient documentation

## 2011-02-01 DIAGNOSIS — F17200 Nicotine dependence, unspecified, uncomplicated: Secondary | ICD-10-CM | POA: Insufficient documentation

## 2011-02-13 ENCOUNTER — Ambulatory Visit: Payer: Medicare Other | Admitting: Physical Medicine & Rehabilitation

## 2011-02-24 ENCOUNTER — Encounter: Payer: Medicare Other | Attending: Physical Medicine & Rehabilitation | Admitting: Neurosurgery

## 2011-02-24 DIAGNOSIS — M5430 Sciatica, unspecified side: Secondary | ICD-10-CM

## 2011-02-24 DIAGNOSIS — M25529 Pain in unspecified elbow: Secondary | ICD-10-CM

## 2011-02-24 DIAGNOSIS — M75100 Unspecified rotator cuff tear or rupture of unspecified shoulder, not specified as traumatic: Secondary | ICD-10-CM | POA: Insufficient documentation

## 2011-02-24 DIAGNOSIS — IMO0002 Reserved for concepts with insufficient information to code with codable children: Secondary | ICD-10-CM | POA: Insufficient documentation

## 2011-02-24 DIAGNOSIS — M069 Rheumatoid arthritis, unspecified: Secondary | ICD-10-CM | POA: Insufficient documentation

## 2011-02-24 NOTE — Assessment & Plan Note (Signed)
Account [id number].  This is a patient of Dr. Wynn Banker seen for left shoulder pain as well as history of rheumatoid arthritis and low back pain.  He states today that his back pain is now worse.  He rates his pain at about 8, it is sharp stabbing constant pain.  He did state that he fell not too long ago, hitting his left elbow on the pavement and driving his left shoulder backwards.  He is having trouble with passive and active range of motion in his shoulder.  General activity level is 0-6.  Pain is worse in the morning.  Sleep patterns are fair.  Pain is aggravated by activities.  He is not sure medication tends to help.  He walks without assistance, he can climb steps,  he can drive, he can walk about 4-5 hours.  Functionality, he is on disability.  REVIEW OF SYSTEMS:  Notable for those difficulties as well as some weight gain, spasms, no suicidal thoughts or aberrant behaviors. Oswestry score is 30.  PAST MEDICAL HISTORY:  Unchanged.  SOCIAL HISTORY:  He is single.  He lives with his girlfriend.  FAMILY HISTORY:  Unchanged.  PHYSICAL EXAMINATION:  His blood pressure 150/100, pulse 91, respirations 16, O2 sats 96 on room air.  His motor strength is 5/5 in the lower extremities, 5/5 on the right upper extremity, 4/5 in the left upper extremity, he gives way to pain.  Sensation is intact. Constitutionally, he is within normal limits.  He is alert and oriented x3 and he has a normal gait.  ASSESSMENT: 1. Chronic pain syndrome, multifactorial. 2. Left shoulder pain, unspecified etiology.  Possible tear after a     fall. 3. Chronic low back pain.  PLAN: 1. Refill oxycodone 5 mg one p.o. t.i.d. 90 with no refill. 2. After informed consent, I Chlorhexidine swabbed his the left     posterior subacromial bursa area and injected him with 3 mL of     lidocaine and 1 mL of Depo-Medrol/40 mg.  He tolerated it well.     There was no bleeding, no blood draw back with the injection.  He   knows to ice it tonight.  He is going to come back and see me in 2-     3 weeks.  If he is no better we are going to obtain an MRI of the     left shoulder.  He is in agreement with this plan.  We will see him     back as scheduled.     Garielle Mroz L. Blima Dessert Electronically Signed    RLW/MedQ D:  02/24/2011 14:05:13  T:  02/24/2011 20:58:31  Job #:  [id number]

## 2011-02-28 ENCOUNTER — Ambulatory Visit: Payer: Medicare Other | Admitting: Physical Medicine & Rehabilitation

## 2011-03-01 ENCOUNTER — Ambulatory Visit (HOSPITAL_COMMUNITY)
Admission: RE | Admit: 2011-03-01 | Discharge: 2011-03-01 | Disposition: A | Discharge status: Home / Self Care | Payer: Medicare HMO | Source: Ambulatory Visit | Attending: Physical Medicine & Rehabilitation | Admitting: Physical Medicine & Rehabilitation

## 2011-03-01 ENCOUNTER — Other Ambulatory Visit: Payer: Self-pay | Admitting: Physical Medicine & Rehabilitation

## 2011-03-01 DIAGNOSIS — M25519 Pain in unspecified shoulder: Secondary | ICD-10-CM

## 2011-03-08 ENCOUNTER — Ambulatory Visit: Payer: Medicare Other | Admitting: Neurosurgery

## 2011-03-10 ENCOUNTER — Ambulatory Visit: Payer: Medicare Other | Admitting: Neurosurgery

## 2011-03-27 ENCOUNTER — Ambulatory Visit: Payer: Medicare Other | Admitting: Physical Medicine & Rehabilitation

## 2011-03-27 ENCOUNTER — Encounter: Payer: Medicare HMO | Attending: Physical Medicine & Rehabilitation

## 2011-03-27 DIAGNOSIS — M25529 Pain in unspecified elbow: Secondary | ICD-10-CM

## 2011-03-27 DIAGNOSIS — M75100 Unspecified rotator cuff tear or rupture of unspecified shoulder, not specified as traumatic: Secondary | ICD-10-CM | POA: Insufficient documentation

## 2011-03-27 DIAGNOSIS — R209 Unspecified disturbances of skin sensation: Secondary | ICD-10-CM

## 2011-03-27 DIAGNOSIS — M069 Rheumatoid arthritis, unspecified: Secondary | ICD-10-CM | POA: Insufficient documentation

## 2011-03-27 DIAGNOSIS — M542 Cervicalgia: Secondary | ICD-10-CM

## 2011-03-27 DIAGNOSIS — IMO0002 Reserved for concepts with insufficient information to code with codable children: Secondary | ICD-10-CM | POA: Insufficient documentation

## 2011-03-27 NOTE — Assessment & Plan Note (Signed)
This is a 50 year old male.  He is complaining of left shoulder pain. He does note some weakness in left arm.  He has had excellent results after subacromial bursa injection x2 in March and in May 2012, and then his last injection which was intra-articular performed on February 24, 2011, was not helpful.  We scheduled back for followup to reevaluate. His average pain is 8/10 described as sharp and aching.  He also notes weakness and pain in his left biceps area.  It feels very deep.  He sometimes feels some pain around his latissimus area on the left side. From a functional status, he can walk hours at a time, he can climb steps, he can drive, he does do some electrical work.  REVIEW OF SYSTEMS:  Blood sugar regulation problems.  He also has a history hypertension.  SOCIAL HISTORY:  Single.  Smokes a half pack per day.  PHYSICAL EXAMINATION:  VITAL SIGNS:  Blood pressure is 161/107, but we will recheck and if still elevated, we will notify PCP; pulse 83, respirations 18, O2 sat 95% on room air. EXTREMITIES:  His left shoulder has mild wasting around the supraspinatus, infraspinatus area as well as the deltoid area.  He has decreased sensation in the left C6, C7, and C5 dermatomal distribution. He has decreased reflexes in the left biceps and triceps.  He also has a positive impingement sign, but a negative drop arm sign. NECK:  Negative Spurling maneuver, has full range of motion.  No pain.  IMPRESSION:  Left shoulder pain.  It certainly has some element of subacromial bursitis based on previous excellent relief with subacromial injection.  However, he is also complaining of left upper extremity weakness and has some sensory deficits as well as decreased reflexes in the left upper extremity.  This brings a question of possible cervical radiculitis, however, he has no neck pain.  We will start by doing a shoulder ultrasound on him and if no significant pathology is identified, we will  check a C-spine MRI.  I will see him back for these.  We will continue the oxycodone.  In terms of his RA, he will follow up with Dr. Nickola Major.     Erick Colace, M.D. Electronically Signed    AEK/MedQ D:  03/27/2011 10:45:21  T:  03/27/2011 19:49:14  Job #:  [id number]  cc:   Zenovia Jordan, MD Fax: [phone]

## 2011-04-12 ENCOUNTER — Encounter: Payer: Medicare Other | Admitting: Internal Medicine

## 2011-04-20 ENCOUNTER — Encounter: Payer: Self-pay | Admitting: Internal Medicine

## 2011-04-20 ENCOUNTER — Ambulatory Visit (INDEPENDENT_AMBULATORY_CARE_PROVIDER_SITE_OTHER): Payer: Medicare HMO | Admitting: Internal Medicine

## 2011-04-20 DIAGNOSIS — M069 Rheumatoid arthritis, unspecified: Secondary | ICD-10-CM

## 2011-04-20 DIAGNOSIS — G894 Chronic pain syndrome: Secondary | ICD-10-CM

## 2011-04-20 DIAGNOSIS — E785 Hyperlipidemia, unspecified: Secondary | ICD-10-CM

## 2011-04-20 DIAGNOSIS — I10 Essential (primary) hypertension: Secondary | ICD-10-CM

## 2011-04-20 MED ORDER — RAMIPRIL 2.5 MG PO CAPS: 2.5000 mg | ORAL_CAPSULE | Freq: Every day | ORAL | Status: DC

## 2011-04-20 MED ORDER — TRAMADOL HCL 50 MG PO TABS: 50.0000 mg | ORAL_TABLET | Freq: Four times a day (QID) | ORAL | Status: DC | PRN

## 2011-04-20 NOTE — Progress Notes (Deleted)
  Subjective:    Patient ID: [name] [name], male    DOB: [birth year], 50 y.o.   MRN: [id number]  HPI    Review of Systems     Objective:   Physical Exam        Assessment & Plan:

## 2011-04-20 NOTE — Patient Instructions (Signed)
Please, make an appointment with your arthritis specialist. You have been started on a Blood pressure medication. Please, follow up with [place] in 8 weeks or sooner.

## 2011-04-20 NOTE — Progress Notes (Signed)
HPI:  1. Requests a refill on oxycodone for his shoulder pain. Patient was seen by a pain management specialist and was dismissed due to a violation of his contract. Details are unknown. Past Medical History  Diagnosis Date  . HLD (hyperlipidemia)   . Lupus   . RA (rheumatoid arthritis)    Current Outpatient Prescriptions  Medication Sig Dispense Refill  . predniSONE (DELTASONE) 20 MG tablet TAKE 4 TABLETS BY MOUTH DAILY X4 DAYS , THEN 3 TABLETS DAILY FOR 4 DAYS , THEN 2 TABLETS DAILY X4 DAYS , THEN 1 TABLET DAILY X4 DAYS  30 tablet  0  . calcium-vitamin D (OSCAL WITH D 500-200) 500-200 MG-UNIT per tablet Take 1 tablet by mouth 2 (two) times daily.        . ramipril (ALTACE) 2.5 MG capsule Take 1 capsule (2.5 mg total) by mouth daily.  10 capsule  11  . simvastatin (ZOCOR) 20 MG tablet Take 20 mg by mouth at bedtime.        . traMADol (ULTRAM) 50 MG tablet Take 1 tablet (50 mg total) by mouth every 6 (six) hours as needed.  60 tablet  11  . DISCONTD: traMADol (ULTRAM) 50 MG tablet Take 50 mg by mouth every 6 (six) hours as needed.         Family History  Problem Relation Age of Onset  . Stroke Neg Hx   . Cancer Neg Hx    History   Social History  . Marital Status: Single    Spouse Name: N/A    Number of Children: N/A  . Years of Education: N/A   Social History Main Topics  . Smoking status: Current Everyday Smoker -- 1.0 packs/day    Types: Cigarettes  . Smokeless tobacco: None  . Alcohol Use: No  . Drug Use: No  . Sexually Active: None   Other Topics Concern  . None   Social History Narrative  . None    Review of Systems: Constitutional: Denies fever, chills, diaphoresis, appetite change and fatigue.  HEENT: Denies photophobia, eye pain, redness, hearing loss, ear pain, congestion, sore throat, rhinorrhea, sneezing, mouth sores, trouble swallowing, neck pain, neck stiffness and tinnitus.  Respiratory: Denies SOB, DOE, cough, chest tightness, and wheezing.    Cardiovascular: Denies chest pain, palpitations and leg swelling.  Gastrointestinal: Denies nausea, vomiting, abdominal pain, diarrhea, constipation, blood in stool and abdominal distention.  Genitourinary: Denies dysuria, urgency, frequency, hematuria, flank pain and difficulty urinating.  Musculoskeletal: Denies myalgias, back pain, joint swelling, arthralgias and gait problem.  Skin: Denies pallor, rash and wound.  Neurological: Denies dizziness, seizures, syncope, weakness, light-headedness, numbness and headaches.  Hematological: Denies adenopathy. Easy bruising, personal or family bleeding history  Psychiatric/Behavioral: Denies suicidal ideation, mood changes, confusion, nervousness, sleep disturbance and agitation   Vitals: reviewed General: alert, well-developed, and cooperative to examination.  Head: normocephalic and atraumatic.  Eyes: vision grossly intact, pupils equal, pupils round, pupils reactive to light, no injection and anicteric.  Mouth: pharynx pink and moist, no erythema, and no exudates.  Neck: supple, full ROM, no thyromegaly, no JVD, and no carotid bruits.  Lungs: normal respiratory effort, no accessory muscle use, normal breath sounds, no crackles, and no wheezes. Heart: normal rate, regular rhythm, no murmur, no gallop, and no rub.  Abdomen: soft, non-tender, normal bowel sounds, no distention, no guarding, no rebound tenderness, no hepatomegaly, and no splenomegaly.  Msk: no joint swelling, no joint warmth, and no redness over joints.  Pulses: 2+ DP/PT  pulses bilaterally Extremities: No cyanosis, clubbing, edema Neurologic: alert & oriented X3, cranial nerves II-XII intact, strength normal in all extremities, sensation intact to light touch, and gait normal.  Skin: turgor normal and no rashes.  Psych: Oriented X3, memory intact for recent and remote, normally interactive, good eye contact, not anxious appearing, and not depressed appearing.    Assessment &  Plan:  1. Chronic pain syndrome -patient was dismissed from pain management clinic ->records requested -declined Rx for oxycodone -Rx Tramadol given (pain contract initiated by Dr. Bosie Clos)  2. RA -f/u with Dr. Lendon Colonel -continue with Prednisone 5 mg PO daily.  3. HTN, new Dx -start Ramipril (side-effects explained to the patient) -recheck  Renal fxn in 1 week -instructed to call with any concerns.

## 2011-04-21 LAB — COMPREHENSIVE METABOLIC PANEL
CO2: 20 mEq/L (ref 19–32)
Calcium: 9 mg/dL (ref 8.4–10.5)
Chloride: 106 mEq/L (ref 96–112)
Creat: 0.95 mg/dL (ref 0.50–1.35)
Glucose, Bld: 115 mg/dL — ABNORMAL HIGH (ref 70–99)
Total Bilirubin: 0.4 mg/dL (ref 0.3–1.2)
Total Protein: 7 g/dL (ref 6.0–8.3)

## 2011-04-21 LAB — LIPID PANEL
Cholesterol: 187 mg/dL (ref 0–200)
Total CHOL/HDL Ratio: 5.8 Ratio
Triglycerides: 248 mg/dL — ABNORMAL HIGH (ref <150)
VLDL: 50 mg/dL — ABNORMAL HIGH (ref 0–40)

## 2011-05-01 ENCOUNTER — Ambulatory Visit: Payer: Medicare Other | Admitting: Physical Medicine & Rehabilitation

## 2011-06-14 ENCOUNTER — Encounter: Payer: Medicare HMO | Admitting: Internal Medicine

## 2011-06-23 ENCOUNTER — Emergency Department (HOSPITAL_COMMUNITY)
Admission: EM | Admit: 2011-06-23 | Discharge: 2011-06-24 | Disposition: A | Discharge status: Home / Self Care | Payer: Medicare HMO | Attending: Emergency Medicine | Admitting: Emergency Medicine

## 2011-06-23 ENCOUNTER — Encounter (HOSPITAL_COMMUNITY): Payer: Self-pay | Admitting: *Deleted

## 2011-06-23 DIAGNOSIS — Z79899 Other long term (current) drug therapy: Secondary | ICD-10-CM | POA: Insufficient documentation

## 2011-06-23 DIAGNOSIS — M549 Dorsalgia, unspecified: Secondary | ICD-10-CM | POA: Insufficient documentation

## 2011-06-23 DIAGNOSIS — M069 Rheumatoid arthritis, unspecified: Secondary | ICD-10-CM | POA: Insufficient documentation

## 2011-06-23 DIAGNOSIS — G8929 Other chronic pain: Secondary | ICD-10-CM | POA: Insufficient documentation

## 2011-06-23 DIAGNOSIS — M329 Systemic lupus erythematosus, unspecified: Secondary | ICD-10-CM | POA: Insufficient documentation

## 2011-06-23 DIAGNOSIS — M25519 Pain in unspecified shoulder: Secondary | ICD-10-CM | POA: Insufficient documentation

## 2011-06-23 DIAGNOSIS — M2550 Pain in unspecified joint: Secondary | ICD-10-CM

## 2011-06-23 DIAGNOSIS — M25559 Pain in unspecified hip: Secondary | ICD-10-CM | POA: Insufficient documentation

## 2011-06-23 DIAGNOSIS — E785 Hyperlipidemia, unspecified: Secondary | ICD-10-CM | POA: Insufficient documentation

## 2011-06-23 NOTE — ED Notes (Signed)
The pt has had lower back pain for a long time worse for the past 4 days.  He is c/o lt shoulder pain also.  No known injury

## 2011-06-24 MED ORDER — HYDROCODONE-ACETAMINOPHEN 5-500 MG PO TABS: 1.0000 | ORAL_TABLET | Freq: Four times a day (QID) | ORAL | Status: AC | PRN

## 2011-06-24 NOTE — ED Provider Notes (Signed)
History     CSN: [id number]  Arrival date & time 06/23/11  1721   First MD Initiated Contact with Patient 06/23/11 2344      Chief Complaint  Patient presents with  . Back Pain    (Consider location/radiation/quality/duration/timing/severity/associated sxs/prior treatment) Patient is a 50 y.o. male presenting with shoulder pain.  Shoulder Pain This is a chronic problem. The current episode started in the past 7 days. The problem occurs constantly. The problem has been gradually worsening. Pertinent negatives include no fever, joint swelling, numbness or weakness. The treatment provided no relief.  Shoulder Pain This is a chronic problem. The current episode started in the past 7 days. The problem occurs constantly. The problem has been gradually worsening. The treatment provided no relief.   patient reports increasing left shoulder and right hip pain over the last 4-5 days. Admits that he has chronic bursitis in the left shoulder and right hip for which he has been under treatment with a rheumatologist as well as the pain clinic. He is on daily prednisone and states normally the pain is bearable. Recently has been unable to pay his provider do to a delay in his Medicaid coverage. Reports that he's been taking 10-15 aspirin a day for the pain with very little relief. States he will have the money to pay his provider this month and will be able to get back in for his ongoing pain management and treatment for bursitis. Denies any new injury.  Past Medical History  Diagnosis Date  . HLD (hyperlipidemia)   . Lupus   . RA (rheumatoid arthritis)     History reviewed. No pertinent past surgical history.  Family History  Problem Relation Age of Onset  . Stroke Neg Hx   . Cancer Neg Hx     History  Substance Use Topics  . Smoking status: Current Everyday Smoker -- 1.0 packs/day    Types: Cigarettes  . Smokeless tobacco: Not on file  . Alcohol Use: No      Review of Systems    Constitutional: Negative.  Negative for fever.  HENT: Negative.   Eyes: Negative.   Respiratory: Negative.   Cardiovascular: Negative.   Gastrointestinal: Negative.   Genitourinary: Negative.   Musculoskeletal: Negative.  Negative for joint swelling.  Skin: Negative.   Neurological: Negative.  Negative for weakness and numbness.  Hematological: Negative.   Psychiatric/Behavioral: Negative.     Allergies  Ramipril and Tramadol  Home Medications   Current Outpatient Rx  Name Route Sig Dispense Refill  . ACETAMINOPHEN 500 MG PO TABS Oral Take 1,000 mg by mouth every 4 (four) hours as needed. For pain     . NEBIVOLOL HCL 5 MG PO TABS Oral Take 5 mg by mouth daily.      [name] PREDNISONE 5 MG PO TABS Oral Take 5 mg by mouth daily.        BP 101/70  Pulse 85  Temp(Src) 98.4 F (36.9 C) (Oral)  Resp 20  SpO2 94%  Physical Exam  Constitutional: He appears well-developed and well-nourished.  HENT:  Head: Normocephalic and atraumatic.  Eyes: Conjunctivae are normal.  Neck: Neck supple.  Cardiovascular: Normal rate and regular rhythm.   Pulmonary/Chest: Effort normal and breath sounds normal.  Abdominal: Soft. Bowel sounds are normal.  Musculoskeletal:       Right shoulder: He exhibits decreased range of motion and pain. He exhibits no swelling, no effusion and no crepitus.       Left hip: He  exhibits tenderness. He exhibits no bony tenderness, no swelling, no crepitus and no deformity.  Neurological: He is alert.  Skin: Skin is warm and dry.  Psychiatric: He has a normal mood and affect.    ED Course  Procedures I have entered the patient's name in the [place] substance reporting database. There does not appear to be any excessive receipt of narcotics. One Rx on 05/30/2011 then a previous in 03/27/2011. I have explained to the patient that it is not appropriate to have chronic pain issues treated in the emergency department. Will prescribe one short course of hydrocodone  and encourage patient to followup with his rheumatologist as planned. Patient verbalizes understanding and is agreeable with plan.  Labs Reviewed - No data to display No results found.   No diagnosis found.    MDM  Chronic joint pain.        Leanne Chang, NP 06/24/11 [phone]  Medical screening examination/treatment/procedure(s) were performed by non-physician practitioner and as supervising physician I was immediately available for consultation/collaboration.   Sunnie Nielsen, MD 06/24/11 [phone]

## 2011-11-07 ENCOUNTER — Encounter (HOSPITAL_COMMUNITY): Payer: Self-pay | Admitting: *Deleted

## 2011-11-07 ENCOUNTER — Inpatient Hospital Stay (HOSPITAL_COMMUNITY)
Admission: EM | Admit: 2011-11-07 | Discharge: 2011-11-09 | DRG: 195 | Disposition: A | Discharge status: Home / Self Care | Payer: Medicare HMO | Attending: Internal Medicine | Admitting: Internal Medicine

## 2011-11-07 ENCOUNTER — Emergency Department (HOSPITAL_COMMUNITY): Payer: Medicare HMO

## 2011-11-07 DIAGNOSIS — E785 Hyperlipidemia, unspecified: Secondary | ICD-10-CM | POA: Diagnosis present

## 2011-11-07 DIAGNOSIS — J189 Pneumonia, unspecified organism: Secondary | ICD-10-CM

## 2011-11-07 DIAGNOSIS — F17200 Nicotine dependence, unspecified, uncomplicated: Secondary | ICD-10-CM | POA: Diagnosis present

## 2011-11-07 DIAGNOSIS — I10 Essential (primary) hypertension: Secondary | ICD-10-CM | POA: Diagnosis present

## 2011-11-07 DIAGNOSIS — Z87891 Personal history of nicotine dependence: Secondary | ICD-10-CM | POA: Diagnosis present

## 2011-11-07 DIAGNOSIS — M329 Systemic lupus erythematosus, unspecified: Secondary | ICD-10-CM | POA: Diagnosis present

## 2011-11-07 DIAGNOSIS — IMO0002 Reserved for concepts with insufficient information to code with codable children: Secondary | ICD-10-CM

## 2011-11-07 DIAGNOSIS — M069 Rheumatoid arthritis, unspecified: Secondary | ICD-10-CM | POA: Diagnosis present

## 2011-11-07 DIAGNOSIS — D899 Disorder involving the immune mechanism, unspecified: Secondary | ICD-10-CM | POA: Diagnosis present

## 2011-11-07 HISTORY — DX: Cardiac murmur, unspecified: R01.1

## 2011-11-07 HISTORY — DX: Essential (primary) hypertension: I10

## 2011-11-07 HISTORY — DX: Pneumonia, unspecified organism: J18.9

## 2011-11-07 LAB — CBC
Hemoglobin: 12.1 g/dL — ABNORMAL LOW (ref 13.0–17.0)
MCH: 26.9 pg (ref 26.0–34.0)
MCHC: 32.3 g/dL (ref 30.0–36.0)
MCV: 83.3 fL (ref 78.0–100.0)
RBC: 4.5 MIL/uL (ref 4.22–5.81)

## 2011-11-07 LAB — COMPREHENSIVE METABOLIC PANEL
ALT: 16 U/L (ref 0–53)
CO2: 26 mEq/L (ref 19–32)
Calcium: 9.3 mg/dL (ref 8.4–10.5)
Creatinine, Ser: 0.91 mg/dL (ref 0.50–1.35)
GFR calc Af Amer: >90 mL/min (ref >90)
GFR calc non Af Amer: >90 mL/min (ref >90)
Glucose, Bld: 123 mg/dL — ABNORMAL HIGH (ref 70–99)
Sodium: 138 mEq/L (ref 135–145)
Total Protein: 7.6 g/dL (ref 6.0–8.3)

## 2011-11-07 LAB — LIPASE, BLOOD: Lipase: 16 U/L (ref 11–59)

## 2011-11-07 MED ORDER — ONDANSETRON HCL 4 MG/2ML IJ SOLN: 4.0000 mg | Freq: Four times a day (QID) | INTRAMUSCULAR | Status: DC | PRN

## 2011-11-07 MED ORDER — ACETAMINOPHEN 325 MG PO TABS
650.0000 mg | ORAL_TABLET | Freq: Once | ORAL | Status: AC
  Administered 2011-11-07: 650 mg via ORAL

## 2011-11-07 MED ORDER — SODIUM CHLORIDE 0.9 % IV SOLN
INTRAVENOUS | Status: DC
  Administered 2011-11-07: 125 mL/h via INTRAVENOUS
  Administered 2011-11-08 – 2011-11-09 (×3): via INTRAVENOUS

## 2011-11-07 MED ORDER — ONDANSETRON HCL 4 MG/2ML IJ SOLN
4.0000 mg | Freq: Once | INTRAMUSCULAR | Status: AC
  Administered 2011-11-07: 4 mg via INTRAVENOUS
  Filled 2011-11-07: qty 2

## 2011-11-07 MED ORDER — ENOXAPARIN SODIUM 40 MG/0.4ML ~~LOC~~ SOLN
40.0000 mg | SUBCUTANEOUS | Status: DC
  Administered 2011-11-07 – 2011-11-08 (×2): 40 mg via SUBCUTANEOUS
  Filled 2011-11-07 (×3): qty 0.4

## 2011-11-07 MED ORDER — DEXTROSE 5 % IV SOLN
1.0000 g | INTRAVENOUS | Status: DC
  Administered 2011-11-08 – 2011-11-09 (×2): 1 g via INTRAVENOUS
  Filled 2011-11-07 (×4): qty 10

## 2011-11-07 MED ORDER — DEXTROSE 5 % IV SOLN
500.0000 mg | INTRAVENOUS | Status: DC
  Administered 2011-11-08: 500 mg via INTRAVENOUS
  Filled 2011-11-07 (×4): qty 500

## 2011-11-07 MED ORDER — ACETAMINOPHEN 650 MG RE SUPP: 650.0000 mg | Freq: Four times a day (QID) | RECTAL | Status: DC | PRN

## 2011-11-07 MED ORDER — KETOROLAC TROMETHAMINE 30 MG/ML IJ SOLN
30.0000 mg | Freq: Four times a day (QID) | INTRAMUSCULAR | Status: DC | PRN
  Administered 2011-11-07 – 2011-11-09 (×7): 30 mg via INTRAVENOUS
  Filled 2011-11-07 (×8): qty 1

## 2011-11-07 MED ORDER — HYDROMORPHONE HCL PF 1 MG/ML IJ SOLN
1.0000 mg | Freq: Once | INTRAMUSCULAR | Status: AC
  Administered 2011-11-07: 1 mg via INTRAVENOUS
  Filled 2011-11-07: qty 1

## 2011-11-07 MED ORDER — ACETAMINOPHEN 325 MG PO TABS: 650.0000 mg | ORAL_TABLET | Freq: Four times a day (QID) | ORAL | Status: DC | PRN

## 2011-11-07 MED ORDER — DEXTROSE 5 % IV SOLN
1.0000 g | Freq: Once | INTRAVENOUS | Status: AC
  Administered 2011-11-07: 1 g via INTRAVENOUS
  Filled 2011-11-07: qty 10

## 2011-11-07 MED ORDER — ONDANSETRON HCL 4 MG PO TABS: 4.0000 mg | ORAL_TABLET | Freq: Four times a day (QID) | ORAL | Status: DC | PRN

## 2011-11-07 MED ORDER — SODIUM CHLORIDE 0.9 % IV SOLN
INTRAVENOUS | Status: AC
  Administered 2011-11-07 – 2011-11-08 (×3): via INTRAVENOUS

## 2011-11-07 MED ORDER — PREDNISONE 50 MG PO TABS
50.0000 mg | ORAL_TABLET | Freq: Every day | ORAL | Status: DC
  Administered 2011-11-07 – 2011-11-09 (×3): 50 mg via ORAL
  Filled 2011-11-07 (×4): qty 1

## 2011-11-07 MED ORDER — ACETAMINOPHEN 325 MG PO TABS
ORAL_TABLET | ORAL | Status: AC
  Filled 2011-11-07: qty 2

## 2011-11-07 MED ORDER — HYDROCODONE-ACETAMINOPHEN 5-325 MG PO TABS
1.0000 | ORAL_TABLET | ORAL | Status: DC | PRN
  Administered 2011-11-07 – 2011-11-08 (×2): 1 via ORAL
  Filled 2011-11-07 (×3): qty 1

## 2011-11-07 MED ORDER — ZOLPIDEM TARTRATE 5 MG PO TABS
5.0000 mg | ORAL_TABLET | Freq: Every evening | ORAL | Status: DC | PRN
  Administered 2011-11-09: 5 mg via ORAL
  Filled 2011-11-07 (×2): qty 1

## 2011-11-07 MED ORDER — DEXTROSE 5 % IV SOLN
500.0000 mg | Freq: Once | INTRAVENOUS | Status: AC
  Administered 2011-11-07: 500 mg via INTRAVENOUS
  Filled 2011-11-07: qty 500

## 2011-11-07 NOTE — ED Notes (Signed)
Pt seen by EDP prior to RN assessment see MD notes, orders received and initiated, pt to xray. Pt alert, NAD, calm, interactive.

## 2011-11-07 NOTE — H&P (Signed)
Hospital Admission Note Date: 11/07/2011  Patient name:  [name] [name]  Medical record number:  [id number] Date of birth:  01-02-1961  Age: 51 y.o. Gender:  male PCP:    Kristie Cowman, MD, MD  Medical Service:   Internal Medicine Teaching Service   Medical Service: [place]  Attending physician: Dr. Maurice March    1st Contact:     Dr. Manson Passey                Pager: [phone] 2nd Contact:    Dr. Allena Katz                   Pager: [phone]  After 5 pm or weekends: 1st Contact:      Pager: [phone] 2nd Contact:      Pager: [phone]    Chief Complaint: shortness of breath  History of Present Illness: Patient is a 51 y.o. male with a PMHx of SLE, RA and is on chronic prednisone therapy comes in today with chief complaint of progressive shortness of breath. Patient was well until 2 weeks ago when he started having "congestion" which manifested as productive cough with thick brown sputum every time he coughed, no blood was noted. He also noticed fevers on and off during this 2 week period but did not take his temperature. Fevers were associated with chills and sweating in last 4-5 days. Patient also complained of progressive shortness of breath associated with sever RUQ pain since last 2-3 days. The pain is 8/10 in intensity, without radiation, aching in character, exacerbated by deep breathing and relieved with short breaths and rest. No nausea, vomiting, chest pain, palpitations, change in bowel or bladder habits noted.   Current Outpatient Medications: Prednisone 5mg  daily nevibolol 5 mg daily  Allergies: Ramipril and Tramadol  Past Medical History: Past Medical History  Diagnosis Date  . HLD (hyperlipidemia)   . Lupus   . RA (rheumatoid arthritis)     Past Surgical History: Past Surgical History  Procedure Date  . Fracture surgery     plate in R arm    Family History: Family History  Problem Relation Age of Onset  . Stroke Neg Hx   . Cancer Neg Hx     Social  History:  Patient lives by himself, is on disability since 1992, used to work with city of [place]. 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 Smokes 1/2 PPD and has been smoking since 25 years. Drinks only occasionally.    Review of Systems: Pertinent items are noted in HPI.  Vital Signs:  BP 122/80  Pulse 98  Temp(Src) 100.1 F (37.8 C) (Oral)  Resp 24  SpO2 96%  Physical Exam: General: Vital signs reviewed and noted. Well-developed, well-nourished, in no acute distress; alert, appropriate and cooperative throughout examination. Sweating profusely throughout  Head: Normocephalic, atraumatic.  Eyes: PERRL, EOMI, No signs of anemia or jaundince.  Ears: TM nonerythematous, not bulging, good light reflex bilaterally.  Nose: Mucous membranes moist, not inflammed, nonerythematous.  Throat: Oropharynx nonerythematous, no exudate appreciated.   Neck: No deformities, masses, or tenderness noted.Supple, No carotid Bruits, no JVD.  Lungs:  Tachypnic, unable to take deep breath due to pain, no crackles or wheezing heard on exam  Heart: RRR. S1 and S2 normal without gallop, murmur, or rubs.  Abdomen:  BS normoactive. Soft, Nondistended, non-tender.  No masses or organomegaly.  Extremities: No pretibial edema.  Neurologic: A&O X3, CN II - XII are grossly intact. Motor strength is 5/5 in the all 4 extremities, Sensations intact  to light touch, Cerebellar signs negative.  Skin: No visible rashes, scars.   Lab results: CBC:    Component Value Date/Time   WBC 17.4* 11/07/2011 0550   HGB 12.1* 11/07/2011 0550   HCT 37.5* 11/07/2011 0550   PLT 317 11/07/2011 0550   MCV 83.3 11/07/2011 0550   NEUTROABS 7.2 04/05/2010 1101   LYMPHSABS 1.9 04/05/2010 1101   MONOABS 0.6 04/05/2010 1101   EOSABS 0.1 04/05/2010 1101   BASOSABS 0.1 04/05/2010 1101      Comprehensive Metabolic Panel:    Component Value Date/Time   NA 138 11/07/2011 0550   K 3.9 11/07/2011 0550   CL 101 11/07/2011 0550   CO2 26 11/07/2011  0550   BUN 8 11/07/2011 0550   CREATININE 0.91 11/07/2011 0550   CREATININE 0.95 04/20/2011 1618   GLUCOSE 123* 11/07/2011 0550   CALCIUM 9.3 11/07/2011 0550   AST 14 11/07/2011 0550   ALT 16 11/07/2011 0550   ALKPHOS 128* 11/07/2011 0550   BILITOT 0.2* 11/07/2011 0550   PROT 7.6 11/07/2011 0550   ALBUMIN 2.9* 11/07/2011 0550     Lab Results  Component Value Date   CKTOTAL 78 07/24/2009   CKMB 1.6 07/24/2009   TROPONINI  Value: 0.02        NO INDICATION OF MYOCARDIAL INJURY. 07/24/2009      Imaging results:   CXR 5/3  IMPRESSION:  New airspace consolidation in the right lower lung suggesting  pneumonia.    Assessment & Plan:  Patient is a 51 year old man with PMH most significant for debilitating RA and SLE requiring him to be on chronic prednisone therapy comes in now with acute shortness of breath  1. Community acquired PNA 2. Immunocompromised 2/2 Chronic prednisone therapy 3. RA 4. SLE 5. HTN 6. Chronic smoker  Plan:  -Azithromycin and ceftriaxone for coverage of atypical and community acquired gram positive organisms respectively. -Urine for legionella and strep -Blood cultures -Sputum for gram stain and culture although the yield is low -admit to med surg bed and check pulse ox with vitals check and supplemental O2 as needed -I feel it is reasonable to start him on steroids to cover for adrenal insufficiency as patient has been on prednisone for>6 months, will start with prednisone 50 mg daily and leave it upto primary team to taper it appropriately -Counselling given for smoking cessation and patient wants to quit cold [place], no nicotine patch at this time -Follow up BP and will hold home BP meds   DVT PPX: Lovenox  Lars Mage MD [facility]3 Internal Medicine Resident Pager [phone] 8:11 AM

## 2011-11-07 NOTE — Plan of Care (Signed)
Problem: Phase I Progression Outcomes Goal: Hemodynamically stable Outcome: Progressing Temp. Decreasing.

## 2011-11-07 NOTE — ED Notes (Signed)
Pt resting, no needs at this time.

## 2011-11-07 NOTE — H&P (Addendum)
[facility] Admission Note Date: 11/07/2011  Patient name: [name] [name] Medical record number: [id number] Date of birth: 05-04-1961 Age: 51 y.o. Gender: male PCP: Kristie Cowman, MD  Medical Service: Internal Medicine  Attending physician:  Dr. Roselyn Reef     Chief Complaint: Chest pain with Cough  History of Present Illness: [name] [name] reports that he has been having what he thought was allergies with cough over the last 3 weeks until he developed chest pain two days ago. He reports producing dark brown phlegm with coughing. He reports a sharp chest pain which he rates at 8/10 with radiation down his right lateral abdominal wall. Coughing and deep breathing aggravate his chest pain but changing position has not helped his pain. Patient has no idea what could have caused his condition and reports this is the first time he has been this ill. Patient reports visiting the ED this morning when his pain got worse which led to admission.  He reports having no fever until this morning when he had a temperature of 101 deg F. He complains of SOB. He denies nausea, vomiting, palpitations, diarrhea, constipation, or dysuria.   Meds: Medications Prior to Admission  Medication Sig Dispense Refill  . guaiFENesin (MUCINEX) 600 MG 12 hr tablet Take 1,200 mg by mouth 2 (two) times daily.      . nebivolol (BYSTOLIC) 5 MG tablet Take 5 mg by mouth daily.          Allergies: Allergies as of 11/07/2011 - Review Complete 11/07/2011  Allergen Reaction Noted  . Ramipril (ramipril) Rash 06/23/2011  . Tramadol Itching and Rash 06/23/2011   Past Medical History  Diagnosis Date  . HLD (hyperlipidemia)   . S Lupus E 1992  . RA (rheumatoid arthritis) 1992  . Hypertension   . Heart murmur   . Pneumonia 10/2011   Past Surgical History  Procedure Date  . Fracture surgery 2000    plate in R arm  . Right arm     plate   Family History  Problem Relation Age of Onset  . Stroke Neg Hx   . Cancer  Neg Hx    History   Social History  . Marital Status: Single    Spouse Name: N/A    Number of Children: N/A  . Years of Education: N/A   Occupational History  . Not on file.   Social History Main Topics  . Smoking status: Current Everyday Smoker -- 1.0 packs/day for 25 years    Types: Cigarettes  . Smokeless tobacco: Never Used  . Alcohol Use: No  . Drug Use: No  . Sexually Active: Not Currently   Other Topics Concern  . 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 Smokes 1/2 PPD and has been smoking since 25 years. Drinks only occasionally.    Review of Systems: All negative except as per HPI  Physical Exam: Blood pressure 124/82, pulse 86, temperature 98.6 F (37 C), temperature source Oral, resp. rate 20, height 5\' 11"  (1.803 m), weight 77.701 kg (171 lb 4.8 oz), SpO2 94.00%. BP 124/82  Pulse 86  Temp(Src) 98.6 F (37 C) (Oral)  Resp 20  Ht 5\' 11"  (1.803 m)  Wt 77.701 kg (171 lb 4.8 oz)  BMI 23.89 kg/m2  SpO2 94%  General Appearance:    Alert, cooperative, in pain, appears stated age  Head:  Normocephalic, without obvious abnormality, atraumatic  Eyes:    PERRL, conjunctiva/corneas clear, EOM's intact          Nose:   Nares normal, septum midline, mucosa normal, no drainage    or sinus tenderness  Throat:   Lips, mucosa, and tongue normal; poor dentition  Neck:   Supple, symmetrical, trachea midline, no adenopathy;       thyroid:  No enlargement/tenderness/nodules      Lungs:     Inspiratory crackles, respirations unlabored  Chest wall:    Right lower chest tenderness to palpation, no deformities  Heart:    Regular rate and rhythm, S1 and S2 normal, no murmur, rub   or gallop  Abdomen:     Soft, RUQ tenderness to palpation, bowel sounds active all four quadrants, no masses, no organomegaly        Extremities:   Extremities normal,  atraumatic, no cyanosis or edema  Pulses:   2+ and symmetric all extremities  Skin:   Skin color, texture, turgor normal, no rashes or lesions  Lymph nodes:   Cervical, supraclavicular, and axillary nodes normal  Neurologic:   Oriented to person place and time. CNII-XII intact.      Lab results: Basic Metabolic Panel:  Basename 11/07/11 0550  NA 138  K 3.9  CL 101  CO2 26  GLUCOSE 123*  BUN 8  CREATININE 0.91  CALCIUM 9.3  MG --  PHOS --   Liver Function Tests:  Basename 11/07/11 0550  AST 14  ALT 16  ALKPHOS 128*  BILITOT 0.2*  PROT 7.6  ALBUMIN 2.9*    Basename 11/07/11 0550  LIPASE 16  AMYLASE --   No results found for this basename: AMMONIA:2 in the last 72 hours CBC:  Basename 11/07/11 0550  WBC 17.4*  NEUTROABS --  HGB 12.1*  HCT 37.5*  MCV 83.3  PLT 317   Cardiac Enzymes: No results found for this basename: CKTOTAL:3,CKMB:3,CKMBINDEX:3,TROPONINI:3 in the last 72 hours BNP: No results found for this basename: PROBNP:3 in the last 72 hours D-Dimer: No results found for this basename: DDIMER:2 in the last 72 hours CBG: No results found for this basename: GLUCAP:6 in the last 72 hours Hemoglobin A1C: No results found for this basename: HGBA1C in the last 72 hours Fasting Lipid Panel: No results found for this basename: CHOL,HDL,LDLCALC,TRIG,CHOLHDL,LDLDIRECT in the last 72 hours Thyroid Function Tests: No results found for this basename: TSH,T4TOTAL,FREET4,T3FREE,THYROIDAB in the last 72 hours Anemia Panel: No results found for this basename: VITAMINB12,FOLATE,FERRITIN,TIBC,IRON,RETICCTPCT in the last 72 hours Coagulation: No results found for this basename: LABPROT:2,INR:2 in the last 72 hours Urine Drug Screen: Drugs of Abuse     Component Value Date/Time   LABOPIA NEG 08/30/2010 1653   LABOPIA POS* 02/22/2010 0000   COCAINSCRNUR NEG 08/30/2010 1653   COCAINSCRNUR NONE DETECTED 07/24/2009 1648   LABBENZ POS* 08/30/2010 1653   LABBENZ POSITIVE*  07/24/2009 1648   AMPHETMU NEG 08/30/2010 1653   AMPHETMU NONE DETECTED 07/24/2009 1648   THCU NONE DETECTED 07/24/2009 1648   LABBARB  Value: NONE DETECTED        DRUG SCREEN FOR MEDICAL PURPOSES ONLY.  IF CONFIRMATION IS NEEDED FOR ANY PURPOSE, NOTIFY LAB WITHIN 5 DAYS.        LOWEST DETECTABLE LIMITS FOR URINE DRUG SCREEN Drug Class       Cutoff (ng/mL) Amphetamine      1000 Barbiturate      200 Benzodiazepine   200 Tricyclics  300 Opiates          300 Cocaine          300 THC              50 07/24/2009 1648    Alcohol Level: No results found for this basename: ETH:2 in the last 72 hours Urinalysis: No results found for this basename: COLORURINE:2,APPERANCEUR:2,LABSPEC:2,PHURINE:2,GLUCOSEU:2,HGBUR:2,BILIRUBINUR:2,KETONESUR:2,PROTEINUR:2,UROBILINOGEN:2,NITRITE:2,LEUKOCYTESUR:2 in the last 72 hours Misc. Labs: Cultures pending  Imaging results:  Dg Chest 2 View  11/07/2011  *RADIOLOGY REPORT*  Clinical Data: Epigastric pain and productive cough.  CHEST - 2 VIEW  Comparison: 10/27/2010  Findings: Interval development of airspace infiltration in the right lower lung consistent with pneumonia.  Mild cardiac enlargement with normal pulmonary vascularity.  Tortuous aorta.  No blunting of costophrenic angles.  No pneumothorax.  IMPRESSION: New airspace consolidation in the right lower lung suggesting pneumonia.  Original Report Authenticated By: Marlon Pel, M.D.    Other results: EKG: Normal EKG,  Sinus tachycardia.  Assessment & Plan by Problem: Principal Problem:  1. Cough with chest pain: Mr Dykman' 3 weeks of cough productive of brown phlegm with associated chest pain getting worse over the last two days is most probably due to community acquired pneumonia. Secondary bacterial infection over what was possibly a viral process is probably the etiology for his pneumonia given that patient reports he was afebrile during this period until this morning when he developed a fever of 101 deg. F.  The patient's increased WBCs support an infectious process. Pulmonary embolism (PE) is a possible cause of his signs and symptoms but it is lower on the differential given the patient's WELLs Score of 1.5 and the fact that Chest x-ray does show consolidation in the right lower lung files, more consistent with pneumonia than PE. Pleural effusion and pneumothorax are also very unlikely as Chest X-ray showed all lung fields intact and the trachea in midline. Pericarditis is very low on the differential as the pain is mainly right sided, does not change with position, and chest x-ray shows normal anatomy-i.e. no Situs Inversus.  Patient will be prescribes broad spectrum antibiotics-Azithromycine 500 mg IV and (ceftriaxzone) Rocephin 1 g IV, consistent with hospital CAP order set, after samples for blood cultures and sputum gram stain have been collected. NORCO 5-325 and Ketorolax 30 mg IV PRN will be prescribed for pain management.   2. Rheumatoid arthritis Currently manifesting as bursitis in left shoulder and chronic low back pain. Currently managed with Prednisone 5 mg per day. Methotrexate(MTX) was stopped in 2000 due to elevated liver enzymes. - Start on stress dose steroids- Prednisone 50 mg daily.  3. SLE Last flair in 1995.   Tests negative in 11/2010. Will advise smoking cessation and avoidance of sulfa drugs   HTN (hypertension) Currently managed with Nebivodol 5 mg tab po. Will continue.   4. Active smoker Patient reports cutting down from 2 packs per day to 1/4 pack per day in an attempt to stop smoking. Will counsel use of nicotine patch.  5. Hyperlipidemia No history of medical management of hyperlipidemia. Will counsel visit with PCP for management.  This is a Psychologist, occupational Note.  The care of the patient was discussed with Dr. Lyn Hollingshead and the assessment and plan was formulated with their assistance.  Please see their note for official documentation of the patient encounter.    Signed: Thornton Papas 11/07/2011, 1:51 PM   Pt seen and examined with Allyson Sabal, MS 4. Agree with above note,  Assessment and plan.  Pt is admitted for CAP and is on chronic prednisone therapy for severe RA. Will treat with Ceftriaxone and azithromycin and start on prednisone 50 mg daily and contact Rheumatologist for question about Immunomodulator Rx which was contemplated in 11/2010. Pain control with vicodin and IV toradol. For further detail, please see Dr. Sumner Boast H&P.

## 2011-11-07 NOTE — ED Notes (Signed)
Lab at [facility], meds given, pt grunting d/t painful respirations.

## 2011-11-07 NOTE — ED Notes (Signed)
Attempted to call report to floor; floor unable to accept to call back 

## 2011-11-07 NOTE — ED Provider Notes (Signed)
History     CSN: [id number]  Arrival date & time 11/07/11  5784   First MD Initiated Contact with Patient 11/07/11 [phone]      Chief Complaint  Patient presents with  . Abdominal Pain    RUQ    (Consider location/radiation/quality/duration/timing/severity/associated sxs/prior treatment) HPI History provided by patient. Cough for the last 2 weeks with on-and-off fevers. Tonight called EMS for persistent cough with severe pain hurts every time he coughs right lower ribs. No recorded fevers. Pain is sharp and not radiating. Hurts to touch that area. Symptoms worse with any kind of movement. He has increasing shortness of breath. Productive sputum is brown without blood. Brought In by EMS with reported hypoxia in route improved with oxygen. Symptoms moderate to severe. Denies any allergies to antibiotics. Past Medical History  Diagnosis Date  . HLD (hyperlipidemia)   . Lupus   . RA (rheumatoid arthritis)     No past surgical history on file.  Family History  Problem Relation Age of Onset  . Stroke Neg Hx   . Cancer Neg Hx     History  Substance Use Topics  . Smoking status: Current Everyday Smoker -- 1.0 packs/day    Types: Cigarettes  . Smokeless tobacco: Not on file  . Alcohol Use: No      Review of Systems  Constitutional: Negative for fever and chills.  HENT: Negative for neck pain and neck stiffness.   Eyes: Negative for pain.  Respiratory: Positive for cough and shortness of breath.   Cardiovascular: Positive for chest pain.  Gastrointestinal: Negative for abdominal pain.  Genitourinary: Negative for dysuria.  Musculoskeletal: Negative for back pain.  Skin: Negative for rash.  Neurological: Negative for headaches.  All other systems reviewed and are negative.    Allergies  Ramipril and Tramadol  Home Medications   Current Outpatient Rx  Name Route Sig Dispense Refill  . GUAIFENESIN ER 600 MG PO TB12 Oral Take 1,200 mg by mouth 2 (two) times daily.      . NEBIVOLOL HCL 5 MG PO TABS Oral Take 5 mg by mouth daily.        BP 143/90  Pulse 101  Temp(Src) 98.8 F (37.1 C) (Oral)  Resp 21  SpO2 91%  Physical Exam  Constitutional: He is oriented to person, place, and time. He appears well-developed and well-nourished.  HENT:  Head: Normocephalic and atraumatic.  Eyes: Conjunctivae and EOM are normal. Pupils are equal, round, and reactive to light.  Neck: Trachea normal. Neck supple. No thyromegaly present.  Cardiovascular: Normal rate, regular rhythm, S1 normal, S2 normal and normal pulses.     No systolic murmur is present   No diastolic murmur is present  Pulses:      Radial pulses are 2+ on the right side, and 2+ on the left side.  Pulmonary/Chest: He has no rhonchi.       Reproducible Tenderness over right anterior ribs without crepitus or rash. Decreased breath sounds on the right side with tachypnea  Abdominal: Soft. Normal appearance and bowel sounds are normal. There is no tenderness. There is no CVA tenderness and negative Murphy's sign.  Musculoskeletal:       BLE:s Calves nontender, no cords or erythema, negative Homans sign  Neurological: He is alert and oriented to person, place, and time. He has normal strength. No cranial nerve deficit or sensory deficit. GCS eye subscore is 4. GCS verbal subscore is 5. GCS motor subscore is 6.  Skin: Skin is  warm and dry. No rash noted. He is not diaphoretic.  Psychiatric: His speech is normal.       Cooperative and appropriate    ED Course  Procedures (including critical care time)   Labs Reviewed  CBC  COMPREHENSIVE METABOLIC PANEL  LIPASE, BLOOD   Dg Chest 2 View  11/07/2011  *RADIOLOGY REPORT*  Clinical Data: Epigastric pain and productive cough.  CHEST - 2 VIEW  Comparison: 10/27/2010  Findings: Interval development of airspace infiltration in the right lower lung consistent with pneumonia.  Mild cardiac enlargement with normal pulmonary vascularity.  Tortuous aorta.  No  blunting of costophrenic angles.  No pneumothorax.  IMPRESSION: New airspace consolidation in the right lower lung suggesting pneumonia.  Original Report Authenticated By: Marlon Pel, M.D.     1. Community acquired pneumonia     Date: 11/07/2011  Rate: 105  Rhythm: sinus tachycardia  QRS Axis: normal  Intervals: normal  ST/T Wave abnormalities: nonspecific ST changes  Conduction Disutrbances:none  Narrative Interpretation:   Old EKG Reviewed: unchanged     MDM  Oxygen. Monitor, IV ABX, serial evaluations, pain control, MED c/s for admit to teaching Service for IV ABx/ hypoxia requiring O2.         Sunnie Nielsen, MD 11/09/11 [phone]

## 2011-11-07 NOTE — ED Notes (Signed)
Here by EMS, here from home (alone), reports recently stripping lead pain at home, also fell 3d ago onto L side, c/o sob and pain to R side after fall, pinpoints pain to RUQ, pain resolved in the first day, but returned tonight with increased sob, cough, productive with dark brown sputumn, also reports sharp RUQ pain with rebound tenderness. Initial SPO2 91% RA, up to 99% on NRB PTA. NSL in place PTA.

## 2011-11-07 NOTE — ED Notes (Signed)
Phlebotomists in with pt at this time

## 2011-11-07 NOTE — Progress Notes (Signed)
11/07/11  1145  Pt. admitted 5511-2 from ER via stretcher; alert and oriented x3; home alone and plan to return home. Coughing up tannish sputum. Placed on bed alarm and explained Fall Prevention Pt. Safety Plan to pt. and signed. Skin intact.   Leandrew Koyanagi Demico Ploch,RN

## 2011-11-07 NOTE — ED Notes (Signed)
Back from xray

## 2011-11-08 LAB — CBC
MCH: 27.1 pg (ref 26.0–34.0)
MCV: 82.9 fL (ref 78.0–100.0)
Platelets: 248 10*3/uL (ref 150–400)
RBC: 3.98 MIL/uL — ABNORMAL LOW (ref 4.22–5.81)
RDW: 13.5 % (ref 11.5–15.5)

## 2011-11-08 LAB — BASIC METABOLIC PANEL
CO2: 24 mEq/L (ref 19–32)
CO2: 23 mEq/L (ref 19–32)
Calcium: 8.9 mg/dL (ref 8.4–10.5)
Calcium: 8.8 mg/dL (ref 8.4–10.5)
Creatinine, Ser: 0.61 mg/dL (ref 0.50–1.35)
Creatinine, Ser: 0.64 mg/dL (ref 0.50–1.35)
GFR calc Af Amer: >90 mL/min (ref >90)
GFR calc non Af Amer: >90 mL/min (ref >90)
GFR calc non Af Amer: >90 mL/min (ref >90)
Glucose, Bld: 111 mg/dL — ABNORMAL HIGH (ref 70–99)
Sodium: 142 mEq/L (ref 135–145)
Sodium: 142 mEq/L (ref 135–145)

## 2011-11-08 LAB — GRAM STAIN

## 2011-11-08 LAB — DIFFERENTIAL
Basophils Absolute: 0 10*3/uL (ref 0.0–0.1)
Basophils Relative: 0 % (ref 0–1)
Eosinophils Absolute: 0 10*3/uL (ref 0.0–0.7)
Neutro Abs: 10.7 10*3/uL — ABNORMAL HIGH (ref 1.7–7.7)
Neutrophils Relative %: 86 % — ABNORMAL HIGH (ref 43–77)

## 2011-11-08 LAB — EXPECTORATED SPUTUM ASSESSMENT W GRAM STAIN, RFLX TO RESP C

## 2011-11-08 MED ORDER — GUAIFENESIN 100 MG/5ML PO SYRP
200.0000 mg | ORAL_SOLUTION | ORAL | Status: DC | PRN
  Administered 2011-11-08: 200 mg via ORAL
  Filled 2011-11-08 (×2): qty 118

## 2011-11-08 NOTE — Progress Notes (Signed)
IM Attemding Admit Note   Mr. Wrage is 51 yo man with prior history of RA and SLE seen by Dr Nickola Major in [place] and notes not accessible. He has been on low dose, 5mg , daily prednisone. He has had cough for 2-3 weeks and in past week describes brownish purulent sputum and some right anterior chest pain upon cough. Exam is notable for decresed breath sounds and right lung base and some rales/crackles. This corresponds to new RLL infiltrate on chest xray. He is not complicated at present and agree with empiric ceftriaxone and azithromycin. Urinary legionella antigen is pending but would also get sputum for legionella culture. Note there is no sputum yet collected and thus no gram stain to guide [place].  Lina Sayre

## 2011-11-08 NOTE — Clinical Documentation Improvement (Signed)
SEPSIS DOCUMENTATION QUERY  THIS DOCUMENT IS NOT A PERMANENT PART OF THE MEDICAL RECORD  TO RESPOND TO THE THIS QUERY, FOLLOW THE INSTRUCTIONS BELOW:  1. If needed, update documentation for the patient's encounter via the notes activity.  2. Access this query again and click edit on the In [employer].  3. After updating, or not, click F2 to complete all highlighted (required) fields concerning your review. Select "additional documentation in the medical record" OR "no additional documentation provided".  4. Click Sign note button.  5. The deficiency will fall out of your In Basket *Please let us know if you are not able to complete this workflow by phone or e-mail (listed below).  Please update your documentation within the medical record to reflect your response to this query.                                                                                     11/08/11  Dear Dr. [facility],  In a better effort to capture your patient's severity of illness, reflect appropriate length of stay and utilization of resources, a review of the patient medical record has revealed the following indicators.    Based on your clinical judgment, please clarify and document in a progress note and/or discharge summary the clinical condition associated with the following supporting information:  In responding to this query please exercise your independent judgment.  The fact that a query is asked, does not imply that any particular answer is desired or expected.   Possible Clinical Conditions   SIRS  Other Condition   Cannot clinically Determine  Risk Factors: Pneumonia Chronic Steroid therapy for SLE  Presenting Signs and Symptoms: "He reports having no fever until this morning when he had a temperature of 101 deg F" per notes WBC 17.4 to 12.5 Intermittent elevated HR ranges 101-110 Temp has ranged from 99.6 to  101F  Cultures: Pending  Treatment: Antibiotics Monitoring labs and vital signs NS@150ml /hr    Reviewed: Query not answered  Thank You,    Rossie Muskrat RN, BSN  Clinical Documentation Specialist Pager:  [phone] [email or web address]  Health Information Management [place]

## 2011-11-08 NOTE — Progress Notes (Signed)
Medical Student Daily Progress Note  Subjective: Patient complained of pain overnight and profuse sweating. Cough still productive of dark brown sputum but says breathing is a little improved.   Objective: Vital signs in last 24 hours: Filed Vitals:   11/07/11 1130 11/07/11 1137 11/07/11 1541 11/07/11 2126  BP: 124/82  133/87 150/95  Pulse: 86  97 71  Temp: 98.6 F (37 C)  99.6 F (37.6 C) 97.4 F (36.3 C)  TempSrc:    Oral  Resp: 20  19 24   Height: 5\' 11"  (1.803 m)     Weight: 77.701 kg (171 lb 4.8 oz)     SpO2: 92% 94% 94% 96%   Weight change:   Intake/Output Summary (Last 24 hours) at 11/08/11 1004 Last data filed at 11/08/11 1610  Gross per 24 hour  Intake   3210 ml  Output   1050 ml  Net   2160 ml   Physical Exam: BP 150/95  Pulse 71  Temp(Src) 97.4 F (36.3 C) (Oral)  Resp 24  Ht 5\' 11"  (1.803 m)  Wt 77.701 kg (171 lb 4.8 oz)  BMI 23.89 kg/m2  SpO2 96%  General Appearance:    Alert, cooperative, no distress, appears stated age  Head:    Normocephalic, without obvious abnormality, atraumatic  Eyes:    PERRL, EOM's intact          Nose:   Nares normal, septum midline, mucosa normal, no drainage    or sinus tenderness  Throat:   Moist mucosa, and tongue normal; poor dentition  Neck:   Supple, symmetrical, trachea midline, no adenopathy;       thyroid:  No enlargement/tenderness/nodules     Lungs:     Inspiratory crackles bilaterally, respirations unlabored, BS diminished on the right  Chest wall:    Tenderness to palpation in right lateral chest wall. No deformity  Heart:    Regular rate and rhythm, S1 and S2 normal, no murmur, rub   or gallop  Abdomen:     Soft, tender to palpation in RUQ, bowel sounds active all four quadrants, no masses, no organomegaly        Extremities:   Extremities normal, atraumatic, no cyanosis or edema  Pulses:   2+ and symmetric all extremities  Skin:   Skin color, texture, turgor normal, no rashes or lesions  Lymph nodes:    Cervical, supraclavicular, and axillary nodes normal  Neurologic:   Oriented x 3. CNII-XII intact.   Lab Results: Basic Metabolic Panel:  Lab 11/08/11 9604 11/08/11 0635  NA 142 142  K 3.6 3.9  CL 109 108  CO2 23 24  GLUCOSE 158* 111*  BUN 10 11  CREATININE 0.64 0.61  CALCIUM 8.8 8.9  MG -- --  PHOS -- --   Liver Function Tests:  Lab 11/07/11 0550  AST 14  ALT 16  ALKPHOS 128*  BILITOT 0.2*  PROT 7.6  ALBUMIN 2.9*    Lab 11/07/11 0550  LIPASE 16  AMYLASE --   No results found for this basename: AMMONIA:2 in the last 168 hours CBC:  Lab 11/08/11 0857 11/07/11 0550  WBC 12.5* 17.4*  NEUTROABS 10.7* --  HGB 10.8* 12.1*  HCT 33.0* 37.5*  MCV 82.9 83.3  PLT 248 317   Cardiac Enzymes: No results found for this basename: CKTOTAL:3,CKMB:3,CKMBINDEX:3,TROPONINI:3 in the last 168 hours BNP: No results found for this basename: PROBNP:3 in the last 168 hours D-Dimer: No results found for this basename: DDIMER:2 in the last  168 hours CBG: No results found for this basename: GLUCAP:6 in the last 168 hours Hemoglobin A1C: No results found for this basename: HGBA1C in the last 168 hours Fasting Lipid Panel: No results found for this basename: CHOL,HDL,LDLCALC,TRIG,CHOLHDL,LDLDIRECT in the last 161 hours Thyroid Function Tests: No results found for this basename: TSH,T4TOTAL,FREET4,T3FREE,THYROIDAB in the last 168 hours Coagulation: No results found for this basename: LABPROT:4,INR:4 in the last 168 hours Anemia Panel: No results found for this basename: VITAMINB12,FOLATE,FERRITIN,TIBC,IRON,RETICCTPCT in the last 168 hours Urine Drug Screen: Drugs of Abuse     Component Value Date/Time   LABOPIA NEG 08/30/2010 1653   LABOPIA POS* 02/22/2010 0000   COCAINSCRNUR NEG 08/30/2010 1653   COCAINSCRNUR NONE DETECTED 07/24/2009 1648   LABBENZ POS* 08/30/2010 1653   LABBENZ POSITIVE* 07/24/2009 1648   AMPHETMU NEG 08/30/2010 1653   AMPHETMU NONE DETECTED 07/24/2009 1648   THCU NONE  DETECTED 07/24/2009 1648   LABBARB  Value: NONE DETECTED        DRUG SCREEN FOR MEDICAL PURPOSES ONLY.  IF CONFIRMATION IS NEEDED FOR ANY PURPOSE, NOTIFY LAB WITHIN 5 DAYS.        LOWEST DETECTABLE LIMITS FOR URINE DRUG SCREEN Drug Class       Cutoff (ng/mL) Amphetamine      1000 Barbiturate      200 Benzodiazepine   200 Tricyclics       300 Opiates          300 Cocaine          300 THC              50 07/24/2009 1648    Alcohol Level: No results found for this basename: ETH:2 in the last 168 hours Urinalysis: No results found for this basename: COLORURINE:2,APPERANCEUR:2,LABSPEC:2,PHURINE:2,GLUCOSEU:2,HGBUR:2,BILIRUBINUR:2,KETONESUR:2,PROTEINUR:2,UROBILINOGEN:2,NITRITE:2,LEUKOCYTESUR:2 in the last 168 hours Misc. Labs: Gram stain, Legionella urine antigen, Strep. Pneumonia antigen  Micro Results: Recent Results (from the past 240 hour(s))  CULTURE, BLOOD (ROUTINE X 2)     Status: Normal (Preliminary result)   Collection Time   11/07/11  7:54 AM      Component Value Range Status Comment   Specimen Description BLOOD RIGHT ARM   Final    Special Requests BOTTLES DRAWN AEROBIC AND ANAEROBIC 10CC   Final    Culture  Setup Time [id number]   Final    Culture     Final    Value:        BLOOD CULTURE RECEIVED NO GROWTH TO DATE CULTURE WILL BE HELD FOR 5 DAYS BEFORE ISSUING A FINAL NEGATIVE REPORT   Report Status PENDING   Incomplete   CULTURE, BLOOD (ROUTINE X 2)     Status: Normal (Preliminary result)   Collection Time   11/07/11  7:57 AM      Component Value Range Status Comment   Specimen Description BLOOD LEFT HAND   Final    Special Requests BOTTLES DRAWN AEROBIC AND ANAEROBIC 10CC   Final    Culture  Setup Time [id number]   Final    Culture     Final    Value:        BLOOD CULTURE RECEIVED NO GROWTH TO DATE CULTURE WILL BE HELD FOR 5 DAYS BEFORE ISSUING A FINAL NEGATIVE REPORT   Report Status PENDING   Incomplete    Studies/Results: Dg Chest 2 View  11/07/2011  *RADIOLOGY REPORT*   Clinical Data: Epigastric pain and productive cough.  CHEST - 2 VIEW  Comparison: 10/27/2010  Findings: Interval development of airspace  infiltration in the right lower lung consistent with pneumonia.  Mild cardiac enlargement with normal pulmonary vascularity.  Tortuous aorta.  No blunting of costophrenic angles.  No pneumothorax.  IMPRESSION: New airspace consolidation in the right lower lung suggesting pneumonia.  Original Report Authenticated By: Marlon Pel, M.D.   Medications: I have reviewed the patient's current medications. Scheduled Meds:   . azithromycin  500 mg Intravenous Q24H  . cefTRIAXone (ROCEPHIN)  IV  1 g Intravenous Q24H  . enoxaparin  40 mg Subcutaneous Q24H  . predniSONE  50 mg Oral Q breakfast   Continuous Infusions:   . sodium chloride 125 mL/hr (11/07/11 0557)  . sodium chloride 150 mL/hr at 11/08/11 0621   PRN Meds:.acetaminophen, acetaminophen, HYDROcodone-acetaminophen, ketorolac, ondansetron (ZOFRAN) IV, ondansetron, zolpidem  Assessment/Plan:  1. Cough with chest pain:  On admission yesterday, patient was given broad spectrum antibiotics-Azithromycine 500 mg IV and (ceftriaxzone) Rocephin 1 g IV, consistent with hospital CAP order set, after samples for blood cultures and sputum gram stain were collected. NORCO 5-325 and Ketorolax 30 mg IV PRN was prescribed for pain management. Will continue regimen pending results of blood cultures and sensitivities. Pain will continue to be managed PRN with [facility] and Ketorolax.  2. Rheumatoid arthritis  Currently manifesting as bursitis in left shoulder and chronic low back pain. Currently managed with Prednisone 5 mg per day. Methotrexate(MTX) was stopped in 2000 due to elevated liver enzymes.  - Start on stress dose steroids- Prednisone 50 mg daily. -Will consult Rheumatologist Dr. Fontaine No for chronic daily dose of prednisone and accessibility to Enbrel.  3. SLE  Last flair in 1995.  Tests negative in 11/2010.    Advised smoking cessation and avoidance of sulfa drugs  HTN (hypertension)  Continue current therapy with Nebivodol 5 mg tab po.   4. Active smoker  Patient reports cutting down from 2 packs per day to 1/4 pack per day in an attempt to stop smoking. Will counsel use of nicotine patch.   5. Hyperlipidemia  No history of medical management of hyperlipidemia. Patient counseled on the need to [facility] to be helped with the management of hyperlipidemia. Patient complained of changing doctors on  every visit. I informed patent that attending physicians transparent to patient are in charge and are more stable. Also, patient was informed that his records here are available to all PCP in our system making it possible for him to receive excellent care from any physician at any time.  6. Anemia Hgb of 10.8<12.1; Patient currently asymptomatic. Will continue to monitor.   LOS: 1 day   This is a Psychologist, occupational Note.  The care of the patient was discussed with Dr. Lyn Hollingshead and the assessment and plan formulated with their assistance.  Please see their attached note for official documentation of the daily encounter.  Thornton Papas 11/08/2011, 10:04 AM  Resident Co-sign Daily Note: I have seen the patient and reviewed the daily progress note by Allyson Sabal MS 4 and discussed the care of the patient with them.  See below for documentation of my findings, assessment, and plans.  Subjective: Pt feels a little better in terms of pain and sputum production. Less sputum than admission and pain a little better- although not completely resolved. Is able to eat properly and has no N/V, abd pain.  Objective: Vital signs in last 24 hours: Filed Vitals:   11/07/11 1130 11/07/11 1137 11/07/11 1541 11/07/11 2126  BP: 124/82  133/87 150/95  Pulse: 86  97 71  Temp: 98.6 F (37 C)  99.6 F (37.6 C) 97.4 F (36.3 C)  TempSrc:    Oral  Resp: 20  19 24   Height: 5\' 11"  (1.803 m)      Weight: 171 lb 4.8 oz (77.701 kg)     SpO2: 92% 94% 94% 96%   Physical Exam: General: resting in bed HEENT: PERRL, EOMI, no scleral icterus Cardiac: RRR, no rubs, murmurs or gallops Pulm: minimal crackles at R base, otherwise good air entry B/L and no wheezes Abd: soft, nontender, nondistended, BS present Ext: warm and well perfused, no pedal edema Neuro: alert and oriented X3, cranial nerves II-XII grossly intact  Lab Results: Reviewed and documented in Electronic Record Micro Results: Reviewed and documented in Electronic Record Studies/Results: Reviewed and documented in Electronic Record Medications: I have reviewed the patient's current medications. Scheduled Meds:   . azithromycin  500 mg Intravenous Q24H  . cefTRIAXone (ROCEPHIN)  IV  1 g Intravenous Q24H  . enoxaparin  40 mg Subcutaneous Q24H  . predniSONE  50 mg Oral Q breakfast   Continuous Infusions:   . sodium chloride 125 mL/hr at 11/08/11 1058  . sodium chloride 150 mL/hr at 11/08/11 0621   PRN Meds:.acetaminophen, acetaminophen, HYDROcodone-acetaminophen, ketorolac, ondansetron (ZOFRAN) IV, ondansetron, zolpidem Assessment/Plan: Principal Problem:  *Community acquired pneumonia Active Problems:  Rheumatoid arthritis  HTN (hypertension)  Active smoker  Pt admitted for CAP of unclear etio. Clinically feeling better. Blood Cx neg till date. Urine strep and legionella antigen negative.  Will get sputum sample for culture and gm stain. Pt will collect and give it to RN. - Likely D/C in 1or 2 days   LOS: 1 day   Shalayah Beagley 11/08/2011, 12:49 PM

## 2011-11-08 NOTE — Care Management Note (Signed)
    Page 1 of 1   11/09/2011     2:17:36 PM   CARE MANAGEMENT NOTE 11/09/2011  Patient:  Stroud,Ramiro   Account Number:  [id number]  Date Initiated:  11/08/2011  Documentation initiated by:  Letha Cape  Subjective/Objective Assessment:   dx cap  admit- lives alone, but his mom will be coming to stay with him until he is better per patient. pta independent.     Action/Plan:   Anticipated DC Date:  11/09/2011   Anticipated DC Plan:  HOME/SELF CARE      DC Planning Services  CM consult      Choice offered to / List presented to:             Status of service:  Completed, signed off Medicare Important Message given?   (If response is "NO", the following Medicare IM given date fields will be blank) Date Medicare IM given:   Date Additional Medicare IM given:    Discharge Disposition:  HOME/SELF CARE  Per UR Regulation:  Reviewed for med. necessity/level of care/duration of stay  If discussed at Long Length of Stay Meetings, dates discussed:    Comments:  [facility]  11/09/11 14;16 Letha Cape RN, BSN [phone] patient for dc today.  11/08/11 14:48 Letha Cape RN, BSN [phone] patient lives alone, but states his mom will be staying with him until he gets better.  Patient has medication coverage and transportation.  PTA independent.  NCM will continue to follow for dc needs.

## 2011-11-08 NOTE — Progress Notes (Signed)
Sputum induction not done.  RN gave patient a sputum cup and patient coughed up a sample on his on and did not require a sputum induction.  Per RN Lanora Manis, the sputum has been sent to the lab and accepted.

## 2011-11-09 DIAGNOSIS — J189 Pneumonia, unspecified organism: Principal | ICD-10-CM

## 2011-11-09 LAB — BASIC METABOLIC PANEL
BUN: 8 mg/dL (ref 6–23)
Calcium: 8.8 mg/dL (ref 8.4–10.5)
Chloride: 107 mEq/L (ref 96–112)
Creatinine, Ser: 0.67 mg/dL (ref 0.50–1.35)
GFR calc Af Amer: >90 mL/min (ref >90)

## 2011-11-09 LAB — CBC
HCT: 32.1 % — ABNORMAL LOW (ref 39.0–52.0)
MCH: 27.2 pg (ref 26.0–34.0)
MCV: 83.2 fL (ref 78.0–100.0)
Platelets: 241 10*3/uL (ref 150–400)
RDW: 13.6 % (ref 11.5–15.5)
WBC: 9.1 10*3/uL (ref 4.0–10.5)

## 2011-11-09 LAB — LEGIONELLA ANTIGEN, URINE: Legionella Antigen, Urine: NEGATIVE

## 2011-11-09 MED ORDER — KETOROLAC TROMETHAMINE 10 MG PO TABS: 20.0000 mg | ORAL_TABLET | Freq: Four times a day (QID) | ORAL | Status: AC | PRN

## 2011-11-09 MED ORDER — AZITHROMYCIN 500 MG PO TABS
500.0000 mg | ORAL_TABLET | Freq: Every day | ORAL | Status: DC
  Administered 2011-11-09: 500 mg via ORAL
  Filled 2011-11-09: qty 1

## 2011-11-09 MED ORDER — PREDNISONE 10 MG PO TABS: ORAL_TABLET | ORAL | Status: AC

## 2011-11-09 MED ORDER — AZITHROMYCIN 250 MG PO TABS: 250.0000 mg | ORAL_TABLET | Freq: Every day | ORAL | Status: AC

## 2011-11-09 MED ORDER — ACETAMINOPHEN 500 MG PO TABS: 500.0000 mg | ORAL_TABLET | Freq: Four times a day (QID) | ORAL | Status: DC | PRN

## 2011-11-09 MED ORDER — NEBIVOLOL HCL 5 MG PO TABS: 5.0000 mg | ORAL_TABLET | Freq: Every day | ORAL | Status: DC

## 2011-11-09 MED ORDER — HYDROCODONE-ACETAMINOPHEN 5-325 MG PO TABS: 1.0000 | ORAL_TABLET | ORAL | Status: AC | PRN

## 2011-11-09 NOTE — Discharge Instructions (Signed)
Please take Azithromycin for next 5 days as prescribed.  Call clinic at [phone] for further questions or concerns before the appointment. Take Prednisone as prescribed If you become short of breath or your symptoms get worse, please call clinic

## 2011-11-09 NOTE — Progress Notes (Signed)
Pt had an elevated bp of 163/100. RN notified Dr on call for Dr. Maurice March. Dr stated " I do not want to give him any IV bp medication. That's fine." RN will continue to monitor pt.

## 2011-11-09 NOTE — Progress Notes (Signed)
Ambulated patient in hall. Oxygen saturation 95% on room air. Tolerated walking with oxygen well. Madelin Rear RN, CMSRN

## 2011-11-09 NOTE — Discharge Summary (Signed)
Internal Medicine Teaching [facility] Discharge Note  Name: [name] [name] MRN: [id number] DOB: 1961-04-11 51 y.o.  Date of Admission: 11/07/2011  5:19 AM Date of Discharge: 11/09/2011 Attending Physician: Burns Spain, MD  Discharge Diagnosis: Principal Problem:  Community acquired pneumonia  Active Problems:  Rheumatoid arthritis  HTN (hypertension)  Active smoker  SLE  Discharge Medications: Medication List  As of 11/09/2011 11:27 AM   ASK your doctor about these medications         guaiFENesin 600 MG 12 hr tablet   Commonly known as: MUCINEX   Take 1,200 mg by mouth 2 (two) times daily.      nebivolol 5 MG tablet   Commonly known as: BYSTOLIC   Take 5 mg by mouth daily.            Disposition and follow-up:   [name] [name] was discharged from [facility] in stable condition.  At the hospital/resident clinic follow up visit please address patient's need to follow-up with Rheumatologist Dr. Nickola Major of [facility]. Repeat Chest X-ray after 6 weeks for resolution of pneumonia, and consider doing iron studies if patient remains anemic.  Follow-up Appointments:  Discharge Orders    Future Appointments: Provider: Department: Dept Phone: Center:   11/28/2011 11:15 AM [name] Mage, MD [facility] [phone] [facility]      Consultations:  None  Procedures Performed:  Dg Chest 2 View  11/07/2011  *RADIOLOGY REPORT*  Clinical Data: Epigastric pain and productive cough.  CHEST - 2 VIEW  Comparison: 10/27/2010  Findings: Interval development of airspace infiltration in the right lower lung consistent with pneumonia.  Mild cardiac enlargement with normal pulmonary vascularity.  Tortuous aorta.  No blunting of costophrenic angles.  No pneumothorax.  IMPRESSION: New airspace consolidation in the right lower lung suggesting pneumonia.  Original Report Authenticated By: Marlon Pel, M.D.    Admission HPI:  [name] [name] is a 51 yo man with a  history of RA, SLE, hypertension, and hyperlipidimia who reported having  what he thought was allergies with cough over 3 weeks until he developed chest pain two days prior to Admission. He reported producing dark brown phlegm with coughing. He reported a sharp chest pain which he rated at 8/10 with radiation down his right lateral abdominal wall. Coughing and deep breathing aggravated his chest pain but changing position did not helped his pain. Patient had no idea what could have caused his condition and reported this was the first time he has been this ill. Patient reported  visiting the ED when his pain got worse which led to admission.  He reported having no fever until the morning of admission when he had a temperature of 101 deg F. He complained of SOB. He denied nausea, vomiting, palpitations, diarrhea, constipation, or dysuria.   Vital Signs:  BP 122/80  Pulse 98  Temp(Src) 100.1 F (37.8 C) (Oral)  Resp 24  SpO2 96%  Physical Exam:  General:  Vital signs reviewed and noted. Well-developed, well-nourished, in no acute distress; alert, appropriate and cooperative throughout examination. Sweating profusely throughout   Head:  Normocephalic, atraumatic.   Eyes:  PERRL, EOMI, No signs of anemia or jaundince.   Ears:  TM nonerythematous, not bulging, good light reflex bilaterally.   Nose:  Mucous membranes moist, not inflammed, nonerythematous.   Throat:  Oropharynx nonerythematous, no exudate appreciated.   Neck:  No deformities, masses, or tenderness noted.Supple, No carotid Bruits, no JVD.   Lungs:  Tachypnic,  unable to take deep breath due to pain, no crackles or wheezing heard on exam   Heart:  RRR. S1 and S2 normal without gallop, murmur, or rubs.   Abdomen:  BS normoactive. Soft, Nondistended, non-tender. No masses or organomegaly.   Extremities:  No pretibial edema.   Neurologic:  A&O X3, CN II - XII are grossly intact. Motor strength is 5/5 in the all 4 extremities, Sensations  intact to light touch, Cerebellar signs negative.   Skin:  No visible rashes, scars.   Lab results:  CBC:    Component  Value  Date/Time    WBC  17.4*  11/07/2011 0550    HGB  12.1*  11/07/2011 0550    HCT  37.5*  11/07/2011 0550    PLT  317  11/07/2011 0550    MCV  83.3  11/07/2011 0550    NEUTROABS  7.2  04/05/2010 1101    LYMPHSABS  1.9  04/05/2010 1101    MONOABS  0.6  04/05/2010 1101    EOSABS  0.1  04/05/2010 1101    BASOSABS  0.1  04/05/2010 1101    Comprehensive Metabolic Panel:    Component  Value  Date/Time    NA  138  11/07/2011 0550    K  3.9  11/07/2011 0550    CL  101  11/07/2011 0550    CO2  26  11/07/2011 0550    BUN  8  11/07/2011 0550    CREATININE  0.91  11/07/2011 0550    CREATININE  0.95  04/20/2011 1618    GLUCOSE  123*  11/07/2011 0550    CALCIUM  9.3  11/07/2011 0550    AST  14  11/07/2011 0550    ALT  16  11/07/2011 0550    ALKPHOS  128*  11/07/2011 0550    BILITOT  0.2*  11/07/2011 0550    PROT  7.6  11/07/2011 0550    ALBUMIN  2.9*  11/07/2011 0550    Lab Results   Component  Value  Date    CKTOTAL  78  07/24/2009    CKMB  1.6  07/24/2009    TROPONINI  Value: 0.02 NO INDICATION OF MYOCARDIAL INJURY.  07/24/2009         Hospital Course by problem list:    Community Acquired Pneumonia On admission , a chest X-ray was obtained which showed right lower lobe consolidation consistent with pneumonia. Blood cultures and legionella antigen test were obtained and results are negative. Urine antigen for Strep. pneumonia was negative. Sputum Gram Stain was positive for Gram Positive diplococci and clusters- but culture showed normal Oropharyngeal flora. Patient was placed on broad spectrum antibiotics for community acquired pneumonia, Azithromycin 500 mg Iv and Ceftriaxone 1 gm IV.  After receiving the third dose on the day of discharge, patient was sent home on Azithromycin 250 mg  PO  For 5 daysand advised to continue for seven days to complete the course. His associated  pain was treated with Hydrocodone and Toradol and patient was discharged home on these medications.   Rheumatoid arthritis Patient indicated that his RA is currently manifesting as left shoulder bursitis and chronic low back pain. He stated he was on 5 mg prednisone but had been off prednisone for over a month. A stress dose of 50 mg per day prednisone was prescribed and patient was given tapering instruction on discharge.    Hx of SLE  Patient reported having his last flare of Lupus  in 1995 and lab records from his Rheumatologist were negative for markers of active SLE including ANA and C3 complement. Patient was advised to follow-up with his Rheumatologist as soon as possible.        HTN (hypertension) Patient's medication was continued at home dose. His was hypertensive throughout stay but was in pain and taking prednisone both of which are known to raise Blood pressure. BP will be rechecked at the clinic visit.   Anemia CBC on admission showed anemia (Hgb 12.1). Patient was asymptopmatic throughout stay. His anemia was judged to be anemia of chronic disease. Iron studies will be done if necessary at the clinic. [name]    Active smoker Patient was counseled about smoking cessation and the use of nicotine patch. He was informed that his inflammatory conditions RA, SLE are aggravated by smoking.  Discharge Vitals:  BP 162/94  Pulse 72  Temp(Src) 98.1 F (36.7 C) (Oral)  Resp 20  Ht 5\' 11"  (1.803 m)  Wt 77.701 kg (171 lb 4.8 oz)  BMI 23.89 kg/m2  SpO2 98%  Discharge Labs:  Results for orders placed during the hospital encounter of 11/07/11 (from the past 24 hour(s))  CULTURE, EXPECTORATED SPUTUM-ASSESSMENT     Status: Normal   Collection Time   11/08/11  1:07 PM      Component Value Range   Specimen Description SPUTUM     Special Requests NONE     Sputum evaluation       Value: THIS SPECIMEN IS ACCEPTABLE. RESPIRATORY CULTURE REPORT TO FOLLOW.   Report Status 11/08/2011 FINAL      CULTURE, RESPIRATORY     Status: Normal (Preliminary result)   Collection Time   11/08/11  1:07 PM      Component Value Range   Specimen Description SPUTUM     Special Requests NONE     Gram Stain       Value: ABUNDANT WBC PRESENT,BOTH PMN AND MONONUCLEAR     FEW GRAM POSITIVE COCCI IN PAIRS     IN CLUSTERS RARE GRAM NEGATIVE RODS     RARE GRAM POSITIVE RODS     FEW SQUAMOUS EPITHELIAL CELLS PRESENT   Culture Culture reincubated for better growth     Report Status PENDING    GRAM STAIN     Status: Normal   Collection Time   11/08/11  1:08 PM      Component Value Range   Specimen Description SPUTUM     Special Requests NONE     Gram Stain       Value: ABUNDANT WBC PRESENT,BOTH PMN AND MONONUCLEAR     FEW GRAM POSITIVE COCCI IN PAIRS IN CLUSTERS     RARE GRAM NEGATIVE RODS     RARE GRAM POSITIVE RODS     FEW SQUAMOUS EPITHELIAL CELLS PRESENT   Report Status 11/08/2011 FINAL    CBC     Status: Abnormal   Collection Time   11/09/11  9:39 AM      Component Value Range   WBC 9.1  4.0 - 10.5 (K/uL)   RBC 3.86 (*) 4.22 - 5.81 (MIL/uL)   Hemoglobin 10.5 (*) 13.0 - 17.0 (g/dL)   HCT 16.1 (*) 09.6 - 52.0 (%)   MCV 83.2  78.0 - 100.0 (fL)   MCH 27.2  26.0 - 34.0 (pg)   MCHC 32.7  30.0 - 36.0 (g/dL)   RDW 04.5  40.9 - 81.1 (%)   Platelets 241  150 - 400 (K/uL)  BASIC  METABOLIC PANEL     Status: Abnormal   Collection Time   11/09/11  9:39 AM      Component Value Range   Sodium 143  135 - 145 (mEq/L)   Potassium 3.5  3.5 - 5.1 (mEq/L)   Chloride 107  96 - 112 (mEq/L)   CO2 25  19 - 32 (mEq/L)   Glucose, Bld 129 (*) 70 - 99 (mg/dL)   BUN 8  6 - 23 (mg/dL)   Creatinine, Ser 4.78  0.50 - 1.35 (mg/dL)   Calcium 8.8  8.4 - 29.5 (mg/dL)   GFR calc non Af Amer >90  >90 (mL/min)   GFR calc Af Amer >90  >90 (mL/min)    Signed: Carleena Mires  2:18 PM Time Spent on Discharge: 35 min

## 2011-11-09 NOTE — Progress Notes (Signed)
[name] to be D/C'd Home per MD order.  Discussed with the patient the After Visit Summary and all questions fully answered.  Susann Givens, RN, [facility] 11/09/2011 12:28 PM

## 2011-11-10 LAB — CULTURE, RESPIRATORY W GRAM STAIN

## 2011-11-13 LAB — CULTURE, BLOOD (ROUTINE X 2)
Culture  Setup Time: 201305141424
Culture: NO GROWTH

## 2011-11-18 ENCOUNTER — Inpatient Hospital Stay (HOSPITAL_COMMUNITY)
Admission: EM | Admit: 2011-11-18 | Discharge: 2011-11-26 | DRG: 163 | Disposition: A | Discharge status: Home / Self Care | Payer: Medicare HMO | Attending: Cardiothoracic Surgery | Admitting: Cardiothoracic Surgery

## 2011-11-18 ENCOUNTER — Emergency Department (HOSPITAL_COMMUNITY): Payer: Medicare HMO

## 2011-11-18 ENCOUNTER — Encounter (HOSPITAL_COMMUNITY): Payer: Self-pay

## 2011-11-18 ENCOUNTER — Inpatient Hospital Stay (HOSPITAL_COMMUNITY): Payer: Medicare HMO

## 2011-11-18 DIAGNOSIS — J189 Pneumonia, unspecified organism: Secondary | ICD-10-CM | POA: Diagnosis present

## 2011-11-18 DIAGNOSIS — J90 Pleural effusion, not elsewhere classified: Principal | ICD-10-CM | POA: Diagnosis present

## 2011-11-18 DIAGNOSIS — I10 Essential (primary) hypertension: Secondary | ICD-10-CM | POA: Diagnosis present

## 2011-11-18 DIAGNOSIS — G4700 Insomnia, unspecified: Secondary | ICD-10-CM | POA: Diagnosis present

## 2011-11-18 DIAGNOSIS — M329 Systemic lupus erythematosus, unspecified: Secondary | ICD-10-CM | POA: Diagnosis present

## 2011-11-18 DIAGNOSIS — J869 Pyothorax without fistula: Secondary | ICD-10-CM | POA: Diagnosis present

## 2011-11-18 DIAGNOSIS — Z87891 Personal history of nicotine dependence: Secondary | ICD-10-CM

## 2011-11-18 DIAGNOSIS — E785 Hyperlipidemia, unspecified: Secondary | ICD-10-CM | POA: Diagnosis present

## 2011-11-18 DIAGNOSIS — M069 Rheumatoid arthritis, unspecified: Secondary | ICD-10-CM | POA: Diagnosis present

## 2011-11-18 LAB — CBC
HCT: 38.2 % — ABNORMAL LOW (ref 39.0–52.0)
Hemoglobin: 12.2 g/dL — ABNORMAL LOW (ref 13.0–17.0)
MCH: 26.9 pg (ref 26.0–34.0)
MCHC: 31.8 g/dL (ref 30.0–36.0)
MCHC: 31.9 g/dL (ref 30.0–36.0)
RDW: 14.8 % (ref 11.5–15.5)
RDW: 14.9 % (ref 11.5–15.5)
WBC: 16.3 10*3/uL — ABNORMAL HIGH (ref 4.0–10.5)

## 2011-11-18 LAB — DIFFERENTIAL
Basophils Absolute: 0 10*3/uL (ref 0.0–0.1)
Basophils Relative: 0 % (ref 0–1)
Eosinophils Absolute: 0.8 10*3/uL — ABNORMAL HIGH (ref 0.0–0.7)
Monocytes Absolute: 1.5 10*3/uL — ABNORMAL HIGH (ref 0.1–1.0)
Monocytes Relative: 8 % (ref 3–12)
Neutrophils Relative %: 65 % (ref 43–77)

## 2011-11-18 LAB — PROTIME-INR: Prothrombin Time: 12.5 seconds (ref 11.6–15.2)

## 2011-11-18 LAB — BASIC METABOLIC PANEL
GFR calc Af Amer: >90 mL/min (ref >90)
GFR calc non Af Amer: >90 mL/min (ref >90)
Potassium: 3.6 mEq/L (ref 3.5–5.1)
Sodium: 137 mEq/L (ref 135–145)

## 2011-11-18 LAB — URINALYSIS, ROUTINE W REFLEX MICROSCOPIC
Leukocytes, UA: NEGATIVE
Nitrite: NEGATIVE
Specific Gravity, Urine: 1.017 (ref 1.005–1.030)
Urobilinogen, UA: 0.2 mg/dL (ref 0.0–1.0)

## 2011-11-18 LAB — POCT I-STAT TROPONIN I

## 2011-11-18 LAB — D-DIMER, QUANTITATIVE: D-Dimer, Quant: 2.62 ug/mL-FEU — ABNORMAL HIGH (ref 0.00–0.48)

## 2011-11-18 MED ORDER — ALUM & MAG HYDROXIDE-SIMETH 200-200-20 MG/5ML PO SUSP: 30.0000 mL | Freq: Four times a day (QID) | ORAL | Status: DC | PRN

## 2011-11-18 MED ORDER — ONDANSETRON HCL 4 MG/2ML IJ SOLN: 4.0000 mg | Freq: Four times a day (QID) | INTRAMUSCULAR | Status: DC | PRN

## 2011-11-18 MED ORDER — SODIUM CHLORIDE 0.9 % IJ SOLN
3.0000 mL | Freq: Two times a day (BID) | INTRAMUSCULAR | Status: DC
  Administered 2011-11-18 – 2011-11-23 (×8): 3 mL via INTRAVENOUS

## 2011-11-18 MED ORDER — SODIUM CHLORIDE 0.9 % IJ SOLN: 3.0000 mL | INTRAMUSCULAR | Status: DC | PRN

## 2011-11-18 MED ORDER — ADULT MULTIVITAMIN W/MINERALS CH
1.0000 | ORAL_TABLET | Freq: Every day | ORAL | Status: DC
  Administered 2011-11-18 – 2011-11-26 (×8): 1 via ORAL
  Filled 2011-11-18 (×9): qty 1

## 2011-11-18 MED ORDER — OXYCODONE HCL 5 MG PO TABS
5.0000 mg | ORAL_TABLET | ORAL | Status: DC | PRN
  Administered 2011-11-18 – 2011-11-21 (×12): 5 mg via ORAL
  Filled 2011-11-18 (×12): qty 1

## 2011-11-18 MED ORDER — TRAZODONE HCL 50 MG PO TABS
50.0000 mg | ORAL_TABLET | Freq: Every evening | ORAL | Status: DC | PRN
  Administered 2011-11-18 – 2011-11-25 (×4): 50 mg via ORAL
  Filled 2011-11-18 (×5): qty 1

## 2011-11-18 MED ORDER — IOHEXOL 300 MG/ML  SOLN
80.0000 mL | Freq: Once | INTRAMUSCULAR | Status: AC | PRN
  Administered 2011-11-18: 80 mL via INTRAVENOUS

## 2011-11-18 MED ORDER — ENOXAPARIN SODIUM 40 MG/0.4ML ~~LOC~~ SOLN
40.0000 mg | SUBCUTANEOUS | Status: DC
  Filled 2011-11-18: qty 0.4

## 2011-11-18 MED ORDER — PREDNISONE 20 MG PO TABS
40.0000 mg | ORAL_TABLET | Freq: Every day | ORAL | Status: DC
  Filled 2011-11-18: qty 2

## 2011-11-18 MED ORDER — HYDROCODONE-ACETAMINOPHEN 5-325 MG PO TABS
1.0000 | ORAL_TABLET | Freq: Once | ORAL | Status: AC
  Administered 2011-11-18: 1 via ORAL
  Filled 2011-11-18: qty 1

## 2011-11-18 MED ORDER — PREDNISONE 20 MG PO TABS
40.0000 mg | ORAL_TABLET | Freq: Every day | ORAL | Status: DC
  Administered 2011-11-18 – 2011-11-20 (×3): 40 mg via ORAL
  Filled 2011-11-18 (×4): qty 2

## 2011-11-18 MED ORDER — SODIUM CHLORIDE 0.9 % IV SOLN
INTRAVENOUS | Status: DC
  Administered 2011-11-18: 160 mL/h via INTRAVENOUS

## 2011-11-18 MED ORDER — ONDANSETRON HCL 4 MG PO TABS: 4.0000 mg | ORAL_TABLET | Freq: Four times a day (QID) | ORAL | Status: DC | PRN

## 2011-11-18 MED ORDER — NEBIVOLOL HCL 5 MG PO TABS
5.0000 mg | ORAL_TABLET | Freq: Every day | ORAL | Status: DC
  Administered 2011-11-19 – 2011-11-26 (×7): 5 mg via ORAL
  Filled 2011-11-18 (×9): qty 1

## 2011-11-18 MED ORDER — DOCUSATE SODIUM 100 MG PO CAPS
100.0000 mg | ORAL_CAPSULE | Freq: Two times a day (BID) | ORAL | Status: DC
  Administered 2011-11-18 – 2011-11-24 (×9): 100 mg via ORAL
  Filled 2011-11-18 (×15): qty 1

## 2011-11-18 MED ORDER — POLYETHYLENE GLYCOL 3350 17 G PO PACK
17.0000 g | PACK | Freq: Every day | ORAL | Status: DC | PRN
  Filled 2011-11-18: qty 1

## 2011-11-18 MED ORDER — SODIUM CHLORIDE 0.9 % IV SOLN: 250.0000 mL | INTRAVENOUS | Status: DC | PRN

## 2011-11-18 NOTE — ED Provider Notes (Signed)
History     CSN: [id number]  Arrival date & time 11/18/11  1229   First MD Initiated Contact with Patient 11/18/11 1256      Chief Complaint  Patient presents with  . Back Pain    (Consider location/radiation/quality/duration/timing/severity/associated sxs/prior treatment) HPI Comments: The patient is a 51 year old man who was hospitalized for pneumonia on 11/07/2011. He was treated with antibiotics and seemed to improve. He was released home, and was advised to continue on azithromycin for a seven-day course. Despite this treatment, he has had persisting pain in his right posterior chest this seems to have gotten worse. He has run out of his Norco, and therefore sought evaluation. He has a prior history of hypertension and of lupus erythematosus.  Patient is a 51 y.o. male presenting with chest pain. The history is provided by the patient and medical records. No language interpreter was used.  Chest Pain The chest pain began 1 - 2 weeks ago. Episode Length: He has a fairly constant right posterior pleuritic chest pain. Chest pain occurs constantly. The chest pain is worsening. The pain is associated with breathing and coughing. At its most intense, the pain is at 8/10. The severity of the pain is moderate. The quality of the pain is described as pleuritic. The pain does not radiate. Chest pain is worsened by deep breathing. Pertinent negatives for primary symptoms include no fever and no cough.  Pertinent negatives for associated symptoms include no diaphoresis. He tried narcotics for the symptoms. Risk factors: Recent episode of pneumonia. Past medical history comments: Community-acquired pneumonia. Rheumatoid arthritis, on steroids.     Past Medical History  Diagnosis Date  . HLD (hyperlipidemia)   . Lupus   . RA (rheumatoid arthritis)   . Hypertension   . Heart murmur   . Pneumonia 10/2011    Past Surgical History  Procedure Date  . Fracture surgery     plate in R arm  . Right  arm     plate    Family History  Problem Relation Age of Onset  . Stroke Neg Hx   . Cancer Neg Hx     History  Substance Use Topics  . Smoking status: Current Everyday Smoker -- 1.0 packs/day for 25 years    Types: Cigarettes  . Smokeless tobacco: Never Used  . Alcohol Use: No      Review of Systems  Constitutional: Negative.  Negative for fever, chills and diaphoresis.  HENT: Negative.   Eyes: Negative.   Respiratory: Negative for cough.        Right pleuritic chest pain  Cardiovascular: Positive for chest pain.  Genitourinary: Negative.   Musculoskeletal: Negative.   Skin: Negative.   Neurological: Negative.   Psychiatric/Behavioral: Negative.     Allergies  Ramipril and Tramadol  Home Medications   Current Outpatient Rx  Name Route Sig Dispense Refill  . ACETAMINOPHEN 500 MG PO TABS Oral Take 1 tablet (500 mg total) by mouth every 6 (six) hours as needed for pain or fever (or Fever >/= 101). 60 tablet 0  . GUAIFENESIN ER 600 MG PO TB12 Oral Take 1,200 mg by mouth 2 (two) times daily.    . ADULT MULTIVITAMIN W/MINERALS CH Oral Take 1 tablet by mouth daily.    . NEBIVOLOL HCL 5 MG PO TABS Oral Take 1 tablet (5 mg total) by mouth daily. 30 tablet 1  . PREDNISONE 10 MG PO TABS  Take 4 tablets daily for 4 days ( total 40 mg ),  then 3 tablets daily for next 4 days ( total 30 mg), then 2 tablets ( 20 mg ) daily for next 4 days and then 10 mg daily ( 1 tablet ) until you come back to clinic. 100 tablet 0  . HYDROCODONE-ACETAMINOPHEN 5-325 MG PO TABS Oral Take 1 tablet by mouth every 4 (four) hours as needed. 20 tablet 0    BP 106/71  Pulse 89  Temp(Src) 99.2 F (37.3 C) (Oral)  Resp 20  SpO2 92%  Physical Exam  Nursing note and vitals reviewed. Constitutional: He is oriented to person, place, and time.       Middle aged man complaining of right posterior chest pain.  HENT:  Head: Normocephalic and atraumatic.  Right Ear: External ear normal.  Left Ear:  External ear normal.  Mouth/Throat: Oropharynx is clear and moist.  Eyes: Conjunctivae and EOM are normal. Pupils are equal, round, and reactive to light. No scleral icterus.  Neck: Normal range of motion. Neck supple.  Cardiovascular: Normal rate, regular rhythm and normal heart sounds.   Pulmonary/Chest: Effort normal.       Decreased breath sounds R base.  Abdominal: Soft. Bowel sounds are normal.  Musculoskeletal: Normal range of motion. He exhibits no edema and no tenderness.  Neurological: He is alert and oriented to person, place, and time.       No sensory or motor deficit.  Skin: Skin is warm and dry.  Psychiatric: He has a normal mood and affect. His behavior is normal.    ED Course  Procedures (including critical care time)  Labs Reviewed  CBC - Abnormal; Notable for the following:    WBC 16.3 (*)    Hemoglobin 12.9 (*)    All other components within normal limits  BASIC METABOLIC PANEL - Abnormal; Notable for the following:    Glucose, Bld 111 (*)    All other components within normal limits  URINALYSIS, ROUTINE W REFLEX MICROSCOPIC  D-DIMER, QUANTITATIVE  PROTIME-INR  APTT   Dg Chest 2 View  11/18/2011  *RADIOLOGY REPORT*  Clinical Data: Right lateral chest pain.  Shortness of breath.  CHEST - 2 VIEW  Comparison: 11/18/2011 at 1:17 p.m.  Findings: Similar right pleural effusion noted with adjacent airspace opacity secondary to atelectasis or pneumonia.  No change in subsegmental atelectasis or scarring at the left costophrenic angle.  Underlying cardiomegaly noted.  IMPRESSION:  1.  Stable appearance of the chest.  No pneumothorax observed.  Original Report Authenticated By: Dellia Cloud, M.D.   Dg Chest 2 View  11/18/2011  *RADIOLOGY REPORT*  Clinical Data: Right chest pain and chest pressure.  CHEST - 2 VIEW  Comparison: 11/07/2011  Findings: Worsening aeration at the right lung base noted, with suspected new right pleural effusion.  The previous right  basilar airspace opacity, residual pneumonia or empyema cannot be completely excluded.  Minimal scarring noted along the lingula.  Cardiomegaly is present.  IMPRESSION:  1.  New moderate to large right pleural effusion with right basilar airspace opacity.  Given the original underlying airspace opacity, the possibility of residual pneumonia or empyema cannot be totally excluded. 2.  Cardiomegaly.  Original Report Authenticated By: Dellia Cloud, M.D.   4:11 PM Patient's chest x-ray shows a right pleural effusion, with residual pneumonia or an empyema not excluded. CT of his chest with IV contrast was ordered to clarify this picture. I contacted the internal medicine teaching service to readmit him.    1. Pleural effusion on right  Carleene Cooper III, MD 11/18/11 [phone]

## 2011-11-18 NOTE — ED Notes (Signed)
Was diagnosed with pneumonia 2 weeks ago and continues to have  Rt. Side pain , rt. Rib area into his rt. Upper back Pt. Continues to be sob, and he believes his breathing has improved.  Does have pain when he coughs

## 2011-11-18 NOTE — H&P (Signed)
Hospital Admission Note Date: 11/18/2011  Patient name: [name] [name] Medical record number: [id number] Date of birth: [birth year] Age: 51 y.o. Gender: male PCP: Kristie Cowman, MD, MD  Medical Service:          Internal Medicine Teaching Service    Attending physician:  Dr.  Blanch Media  Internal Medicine Teaching Service Contact Information  1st Contact:  Quentin Ore Pager: [phone] 2nd Contact:  Johnette Abraham Pager: [phone] After 5 pm or weekends: 1st Contact:      Pager: [phone] 2nd Contact:      Pager: [phone]  Chief Complaint:  Chief Complaint  Patient presents with  . Back Pain    History of Present Illness: Arik Husmann is a 51 y.o.male with past medical history significant for rheumatoid arthritis, lupus, hypertension and hyperlipidemia as well as admission for right-sided pneumonia May 14-16, who presents with right sided chest pain and pleural effusion since discharge.    Abdulai Blaylock states that he has been getting better discharge however he has had continued right-sided chest pain. The chest pain originally began about the 7 th of May with productive brown cough. He was admitted from the 14-16th of May to the [facility] for community-acquired pneumonia. He states that he took his antibiotics as prescribed after discharge. The pain is located in the right thorax and abdomen with slight radiation to the back.  He states the pain has been continuous since the 7th, with the only relief being the pain medication provided at discharge. Pain is "like someone has beaten me with a stick" and sharp. It is occasionally worse with deep inspiration, but especially worse with cough and change in position. He feels like his breathing is more shallow. Pain is better with sitting upright and he has needed to sleep in a recliner. He has been taking ASA, and "10-15" of  extra-strength acetaminophen daily. He has been trying to stretch the opiate pain medicine out, but ran out at 4 AM  on the AM of admission. Over the course of the day, his pain was so unbearable that he could not sit up or manage any longer and came to the ED. he states he has Mild SOB that has been constant since discharge, but overall his breathing has improved. He denies fevers but states that he . He has a mild non-productive cough that is "improved a whole lot" since his prior admission. He feels like he is on the up-swing and could "run a 100 yard dash" if he could get the pain under control. Denies N/V.    Review of Systems: Bold Items are Positive Constitutional: Fever, chills, diaphoresis, appetite change and fatigue.  HEENT: No vision changes, congestion, sore throat, rhinorrhea, sneezing, trouble swallowing, neck pain,   Respiratory: SOB, DOE, cough, chest tightness,  and wheezing.   Cardiovascular: Chest pain, palpitations and leg swelling.  Gastrointestinal: Nausea, vomiting, abdominal pain, diarrhea, constipation, blood in stool  Genitourinary: Dysuria, urgency, frequency, hematuria, flank pain and difficulty urinating.  Musculoskeletal: Myalgias, back pain, joint swelling, arthralgias (low back pain and shoulder bursitis) and gait problem.  Skin: Pallor, rash and wound.  Neurological: Dizziness, seizures, syncope, weakness, light-headedness, numbness and headaches.  Hematological: Adenopathy. Easy bruising, personal or family bleeding history  Psychiatric/Behavioral: Suicidal ideation, mood changes, confusion, nervousness, sleep disturbance and agitation  Past Medical History  Diagnosis Date  . HLD (hyperlipidemia)   . Lupus   . RA (rheumatoid arthritis)   . Hypertension   . Heart murmur   . Pneumonia  10/2011    Past Surgical History  Procedure Date  . Fracture surgery     plate in R arm  . Right arm     plate    Meds:  Medications Prior to Admission  Medication Sig Dispense Refill  . acetaminophen (TYLENOL) 500 MG tablet Take 1 tablet (500 mg total) by mouth every 6 (six) hours  as needed for pain or fever (or Fever >/= 101).  60 tablet  0  . nebivolol (BYSTOLIC) 5 MG tablet Take 1 tablet (5 mg total) by mouth daily.  30 tablet  1  . predniSONE (DELTASONE) 10 MG tablet Take 4 tablets daily for 4 days ( total 40 mg ), then 3 tablets daily for next 4 days ( total 30 mg), then 2 tablets ( 20 mg ) daily for next 4 days and then 10 mg daily ( 1 tablet ) until you come back to clinic.  100 tablet  0    Allergies: Ramipril and Tramadol  Family History  Problem Relation Age of Onset  . Stroke Neg Hx   . Cancer Neg Hx   Mom - HTN (71 yo) Dad - Died of cirrhosis (Etoh)  History   Social History  . Marital Status: Single    Spouse Name: N/A    Number of Children: N/A  . Years of Education: N/A   Occupational History  . Not on file.   Social History Main Topics  . Smoking status: Former Smoker -- 1.0 packs/day for 25 years    Types: Cigarettes    Quit date: 11/06/2011  . Smokeless tobacco: Never Used  . Alcohol Use: No  . Drug Use: No  . Sexually Active: Not Currently   Other Topics Concern  . Not on file   Social History Narrative   Patient lives by himself, is on disability since 1992, used to work with city of [place]. 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 Smokes 1/2 PPD and has been smoking since 25 years. Drinks only occasionally.  Mother is living with him currently. Denies Etoh since 1995.   Physical Exam: Blood pressure 118/77, pulse 93, temperature 98.7 F (37.1 C), temperature source Oral, resp. rate 24, height 5\' 7"  (1.702 m), weight 171 lb 3.2 oz (77.656 kg), SpO2 93.00%. Gen: Well-developed, well-nourished male  in no acute distress; alert, appropriate and cooperative throughout examination. Head: Normocephalic, atraumatic. Eyes: PERRL, EOMI, No signs of anemia or jaundince. Nose: Mucous membranes moist, not inflammed, nonerythematous. Throat: Oropharynx nonerythematous, no exudate appreciated.  Neck: Supple with no deformities, masses, or  tenderness noted.  No carotid Bruits, no JVD. Lungs: Normal respiratory effort. Decreased expansion and breath sounds on the right. Crackles present on the right  Heart: RRR. S1 and S2 normal without  murmur, gallop,or rubs. Abdomen: BS normoactive. Soft, nondistended, non-tender. No masses or organomegaly. Extremities: No pretibial edema. Neurologic: A&O X3, CN II - XII are grossly intact. Motor strength is 5/5 in the all 4 extremities, Sensations intact to light touch. No focal neurologic deficit Skin: No visible rashes, scars. Psych: mood and affect are normal.    Lab results: Basic Metabolic Panel:  Basename 11/18/11 1338  NA 137  K 3.6  CL 99  CO2 27  GLUCOSE 111*  BUN 15  CREATININE 0.79  CALCIUM 9.2  MG --  PHOS --   CBC:  Basename 11/18/11 1817 11/18/11 1338  WBC 17.7* 16.3*  NEUTROABS 11.5* --  HGB 12.2* 12.9*  HCT 38.2* 40.6  MCV 84.1 84.6  PLT 236 245   D-Dimer:  Basename 11/18/11 1540  DDIMER 2.62*   Coagulation:  Basename 11/18/11 1540  LABPROT 12.5  INR 0.91   Urine Drug Screen: Drugs of Abuse     Component Value Date/Time   LABOPIA NEG 08/30/2010 1653   LABOPIA POS* 02/22/2010 0000   COCAINSCRNUR NEG 08/30/2010 1653   COCAINSCRNUR NONE DETECTED 07/24/2009 1648   LABBENZ POS* 08/30/2010 1653   LABBENZ POSITIVE* 07/24/2009 1648   AMPHETMU NEG 08/30/2010 1653   AMPHETMU NONE DETECTED 07/24/2009 1648   THCU NONE DETECTED 07/24/2009 1648   LABBARB  Value: NONE DETECTED        DRUG SCREEN FOR MEDICAL PURPOSES ONLY.  IF CONFIRMATION IS NEEDED FOR ANY PURPOSE, NOTIFY LAB WITHIN 5 DAYS.        LOWEST DETECTABLE LIMITS FOR URINE DRUG SCREEN Drug Class       Cutoff (ng/mL) Amphetamine      1000 Barbiturate      200 Benzodiazepine   200 Tricyclics       300 Opiates          300 Cocaine          300 THC              50 07/24/2009 1648    Urinalysis:  Basename 11/18/11 1524  COLORURINE YELLOW  LABSPEC 1.017  PHURINE 6.0  GLUCOSEU NEGATIVE  HGBUR NEGATIVE    BILIRUBINUR NEGATIVE  KETONESUR NEGATIVE  PROTEINUR NEGATIVE  UROBILINOGEN 0.2  NITRITE NEGATIVE  LEUKOCYTESUR NEGATIVE    Imaging results:  Dg Chest 2 View  11/18/2011  *RADIOLOGY REPORT*  Clinical Data: Right lateral chest pain.  Shortness of breath.  CHEST - 2 VIEW  Comparison: 11/18/2011 at 1:17 p.m.  Findings: Similar right pleural effusion noted with adjacent airspace opacity secondary to atelectasis or pneumonia.  No change in subsegmental atelectasis or scarring at the left costophrenic angle.  Underlying cardiomegaly noted.  IMPRESSION:  1.  Stable appearance of the chest.  No pneumothorax observed.  Original Report Authenticated By: Dellia Cloud, M.D.   Dg Chest 2 View  11/18/2011  *RADIOLOGY REPORT*  Clinical Data: Right chest pain and chest pressure.  CHEST - 2 VIEW  Comparison: 11/07/2011  Findings: Worsening aeration at the right lung base noted, with suspected new right pleural effusion.  The previous right basilar airspace opacity, residual pneumonia or empyema cannot be completely excluded.  Minimal scarring noted along the lingula.  Cardiomegaly is present.  IMPRESSION:  1.  New moderate to large right pleural effusion with right basilar airspace opacity.  Given the original underlying airspace opacity, the possibility of residual pneumonia or empyema cannot be totally excluded. 2.  Cardiomegaly.  Original Report Authenticated By: Dellia Cloud, M.D.   Ct Chest W Contrast  11/18/2011  *RADIOLOGY REPORT*  Clinical Data: Pneumonia.  Right pleuritic chest pain.  Pleural effusion.  Shortness of breath.  Cough.  CT CHEST WITH CONTRAST  Technique:  Multidetector CT imaging of the chest was performed following the standard protocol during bolus administration of intravenous contrast.  Contrast: 80mL OMNIPAQUE IOHEXOL 300 MG/ML  SOLN  Comparison: 11/18/2011  Findings: A moderate sized right pleural effusion is seen which has a multi lobulated contour suggesting loculations.  No definite pleural enhancement or pleural based masses. The left lower lobe consolidation and volume loss is demonstrated.  Mild compressive atelectasis also seen involving the right middle and upper lobes.  The left lung is clear.  No left-sided pleural  effusion identified. No evidence of centrally obstructing hilar masses or lymphadenopathy.  Shotty less than 1 cm mediastinal lymph nodes are seen, measuring up to 10 mm.  A 7 mm nodular densities seen within the anterior left upper lobe on image 26, and a 6 mm subsolid nodular density is also seen in the middle lobe on image 30.  IMPRESSION:  1.  Moderate right pleural effusion which is probably loculated. Empyema cannot be excluded. 2.  Right lower lobe consolidation and volume loss, with mild compressive atelectasis involving the right middle and upper lobes. 3.  Shotty mediastinal lymph nodes measuring up to 10 mm, likely reactive. 4.  Indeterminate bilateral pulmonary nodules largest measuring 7 mm in the left upper lobe.  If the patient is at high risk for bronchogenic carcinoma, follow-up chest CT at 3-6 months is recommended.  If the patient is at low risk for bronchogenic carcinoma, follow-up chest CT at 6-12 months is recommended.  This recommendation follows the consensus statement: Guidelines for Management of Small Pulmonary Nodules Detected on CT Scans: A Statement from the Fleischner Society as published in Radiology 2005; 237:395-400.  Original Report Authenticated By: Danae Orleans, M.D.   Assessment & Plan by Problem:  1) Possibly loculated pleural effusion - patient is having continued pain from pleural effusion. He does not endorse fevers or chills, and is feeling continually improved with the exception of pain. CT shows moderate, possibly loculated pleural effusion. He was admitted for evaluation of this effusion. This is almost certainly related to his right-sided pneumonia. Possibilities include uncomplicated para pneumonic effusion,  comlicated para pneumonic or empyema, which would be determined by fluid sampling.  He does not have CHF and malignant effusion would not rise in a week. He does have pulmonary nodules and a smoking history though. We will perform diagnostic and therapeutic thoracentesis tomorrow with the help of interventional radiology and then start antibiotic therapy for likely parapneumonic effusion.  -- Admit to patient -- Regular diet n.p.o. after midnight -- Pain control with oxycodone IR and Toradol -- Will have IR drain pleural effusion  2) rheumatoid arthritis - stable.We will continue the patient at his current dose of 40 mg prednisone for possible acute flare (2/2 left shoulder bursitis and chronic low back pain.  3) hypertension - blood pressure well controlled on home regimen.  -- Nebivolol 5 mg by mouth daily  4) history of SLE - unlikely to be SLE has been generally do very poorly and he is currently not on therapy besides prednisone. We will continue his current therapy.  DVT prophylaxis - SCDs  Signed: Kimberley Speece 11/18/2011, 7:42 PM

## 2011-11-18 NOTE — ED Notes (Signed)
Patient transported to X-ray 

## 2011-11-19 ENCOUNTER — Inpatient Hospital Stay (HOSPITAL_COMMUNITY): Payer: Medicare HMO

## 2011-11-19 DIAGNOSIS — J869 Pyothorax without fistula: Secondary | ICD-10-CM

## 2011-11-19 DIAGNOSIS — J90 Pleural effusion, not elsewhere classified: Principal | ICD-10-CM

## 2011-11-19 DIAGNOSIS — M069 Rheumatoid arthritis, unspecified: Secondary | ICD-10-CM

## 2011-11-19 LAB — CBC
HCT: 39.3 % (ref 39.0–52.0)
Hemoglobin: 12.9 g/dL — ABNORMAL LOW (ref 13.0–17.0)
MCH: 27 pg (ref 26.0–34.0)
MCHC: 32.4 g/dL (ref 30.0–36.0)
MCHC: 32.8 g/dL (ref 30.0–36.0)
MCV: 82.5 fL (ref 78.0–100.0)
MCV: 82.4 fL (ref 78.0–100.0)
Platelets: 252 10*3/uL (ref 150–400)
Platelets: 255 10*3/uL (ref 150–400)
RBC: 4.77 MIL/uL (ref 4.22–5.81)
RDW: 14.9 % (ref 11.5–15.5)
RDW: 15 % (ref 11.5–15.5)
WBC: 20.5 10*3/uL — ABNORMAL HIGH (ref 4.0–10.5)
WBC: 20 10*3/uL — ABNORMAL HIGH (ref 4.0–10.5)

## 2011-11-19 LAB — TYPE AND SCREEN
ABO/RH(D): O POS
Antibody Screen: NEGATIVE

## 2011-11-19 LAB — COMPREHENSIVE METABOLIC PANEL
ALT: 86 U/L — ABNORMAL HIGH (ref 0–53)
ALT: 76 U/L — ABNORMAL HIGH (ref 0–53)
AST: 55 U/L — ABNORMAL HIGH (ref 0–37)
AST: 36 U/L (ref 0–37)
Albumin: 2.8 g/dL — ABNORMAL LOW (ref 3.5–5.2)
Albumin: 2.7 g/dL — ABNORMAL LOW (ref 3.5–5.2)
Alkaline Phosphatase: 152 U/L — ABNORMAL HIGH (ref 39–117)
Alkaline Phosphatase: 141 U/L — ABNORMAL HIGH (ref 39–117)
BUN: 18 mg/dL (ref 6–23)
CO2: 28 mEq/L (ref 19–32)
CO2: 29 mEq/L (ref 19–32)
Calcium: 9.8 mg/dL (ref 8.4–10.5)
Calcium: 9.7 mg/dL (ref 8.4–10.5)
Chloride: 96 mEq/L (ref 96–112)
Chloride: 97 mEq/L (ref 96–112)
Creatinine, Ser: 0.83 mg/dL (ref 0.50–1.35)
GFR calc Af Amer: >90 mL/min (ref >90)
GFR calc Af Amer: >90 mL/min (ref >90)
GFR calc non Af Amer: >90 mL/min (ref >90)
GFR calc non Af Amer: >90 mL/min (ref >90)
Glucose, Bld: 130 mg/dL — ABNORMAL HIGH (ref 70–99)
Glucose, Bld: 114 mg/dL — ABNORMAL HIGH (ref 70–99)
Potassium: 4.6 mEq/L (ref 3.5–5.1)
Potassium: 4.3 mEq/L (ref 3.5–5.1)
Sodium: 134 mEq/L — ABNORMAL LOW (ref 135–145)
Sodium: 137 mEq/L (ref 135–145)
Total Bilirubin: 0.3 mg/dL (ref 0.3–1.2)
Total Bilirubin: 0.3 mg/dL (ref 0.3–1.2)
Total Protein: 7.4 g/dL (ref 6.0–8.3)
Total Protein: 7.3 g/dL (ref 6.0–8.3)

## 2011-11-19 LAB — DIFFERENTIAL
Basophils Absolute: 0 10*3/uL (ref 0.0–0.1)
Eosinophils Absolute: 0.2 10*3/uL (ref 0.0–0.7)
Lymphocytes Relative: 18 % (ref 12–46)
Lymphs Abs: 3.7 10*3/uL (ref 0.7–4.0)
Monocytes Absolute: 1.4 10*3/uL — ABNORMAL HIGH (ref 0.1–1.0)
Monocytes Relative: 7 % (ref 3–12)
Neutro Abs: 15.2 10*3/uL — ABNORMAL HIGH (ref 1.7–7.7)

## 2011-11-19 LAB — SODIUM, BODY FLUID: Sodium Fluid: 139 mEq/L

## 2011-11-19 LAB — BLOOD GAS, ARTERIAL
Acid-Base Excess: 3.7 mmol/L — ABNORMAL HIGH (ref 0.0–2.0)
Bicarbonate: 27.3 mEq/L — ABNORMAL HIGH (ref 20.0–24.0)
Drawn by: 331001
FIO2: 0.21 %
O2 Saturation: 93.3 %
Patient temperature: 98.6
TCO2: 28.5 mmol/L (ref 0–100)
pCO2 arterial: 38.3 mmHg (ref 35.0–45.0)
pH, Arterial: 7.467 — ABNORMAL HIGH (ref 7.350–7.450)
pO2, Arterial: 64.6 mmHg — ABNORMAL LOW (ref 80.0–100.0)

## 2011-11-19 LAB — ALBUMIN, FLUID (OTHER): Albumin, Fluid: 2.2 g/dL

## 2011-11-19 LAB — URINALYSIS, ROUTINE W REFLEX MICROSCOPIC
Bilirubin Urine: NEGATIVE
Glucose, UA: NEGATIVE mg/dL
Hgb urine dipstick: NEGATIVE
Ketones, ur: NEGATIVE mg/dL
Leukocytes, UA: NEGATIVE
Nitrite: NEGATIVE
Protein, ur: NEGATIVE mg/dL
Specific Gravity, Urine: 1.013 (ref 1.005–1.030)
Urobilinogen, UA: 0.2 mg/dL (ref 0.0–1.0)
pH: 7 (ref 5.0–8.0)

## 2011-11-19 LAB — PH, BODY FLUID: pH, Fluid: 7.5

## 2011-11-19 LAB — HEMOGLOBIN A1C
Hgb A1c MFr Bld: 5.8 % — ABNORMAL HIGH (ref <5.7)
Mean Plasma Glucose: 120 mg/dL — ABNORMAL HIGH (ref <117)

## 2011-11-19 LAB — PROTIME-INR
INR: 0.92 (ref 0.00–1.49)
Prothrombin Time: 12.6 seconds (ref 11.6–15.2)

## 2011-11-19 LAB — PROTEIN, BODY FLUID

## 2011-11-19 LAB — CARDIAC PANEL(CRET KIN+CKTOT+MB+TROPI)
CK, MB: 0.8 ng/mL (ref 0.3–4.0)
Relative Index: INVALID (ref 0.0–2.5)
Total CK: 13 U/L (ref 7–232)

## 2011-11-19 LAB — LACTATE DEHYDROGENASE, PLEURAL OR PERITONEAL FLUID: LD, Fluid: 1051 U/L — ABNORMAL HIGH (ref 3–23)

## 2011-11-19 LAB — GLUCOSE, SEROUS FLUID: Glucose, Fluid: 53 mg/dL

## 2011-11-19 LAB — BODY FLUID CELL COUNT WITH DIFFERENTIAL: Eos, Fluid: 7 %

## 2011-11-19 LAB — SURGICAL PCR SCREEN
MRSA, PCR: NEGATIVE
Staphylococcus aureus: NEGATIVE

## 2011-11-19 LAB — APTT: aPTT: 30 seconds (ref 24–37)

## 2011-11-19 LAB — LACTATE DEHYDROGENASE: LDH: 322 U/L — ABNORMAL HIGH (ref 94–250)

## 2011-11-19 MED ORDER — PIPERACILLIN-TAZOBACTAM 3.375 G IVPB
3.3750 g | Freq: Three times a day (TID) | INTRAVENOUS | Status: DC
  Administered 2011-11-19 – 2011-11-26 (×20): 3.375 g via INTRAVENOUS
  Filled 2011-11-19 (×26): qty 50

## 2011-11-19 MED ORDER — CEFUROXIME SODIUM 1.5 G IJ SOLR: 1.5000 g | INTRAMUSCULAR | Status: DC

## 2011-11-19 MED ORDER — ENOXAPARIN SODIUM 40 MG/0.4ML ~~LOC~~ SOLN
40.0000 mg | SUBCUTANEOUS | Status: DC
  Administered 2011-11-19: 40 mg via SUBCUTANEOUS
  Filled 2011-11-19 (×2): qty 0.4

## 2011-11-19 MED ORDER — IBUPROFEN 600 MG PO TABS
600.0000 mg | ORAL_TABLET | Freq: Four times a day (QID) | ORAL | Status: DC | PRN
  Filled 2011-11-19: qty 1

## 2011-11-19 MED ORDER — PIPERACILLIN-TAZOBACTAM 3.375 G IVPB
3.3750 g | Freq: Four times a day (QID) | INTRAVENOUS | Status: AC
  Administered 2011-11-19: 3.375 g via INTRAVENOUS
  Filled 2011-11-19 (×2): qty 50

## 2011-11-19 MED ORDER — VANCOMYCIN HCL IN DEXTROSE 1-5 GM/200ML-% IV SOLN
1000.0000 mg | Freq: Three times a day (TID) | INTRAVENOUS | Status: DC
  Administered 2011-11-19 – 2011-11-24 (×15): 1000 mg via INTRAVENOUS
  Filled 2011-11-19 (×19): qty 200

## 2011-11-19 NOTE — Progress Notes (Signed)
ANTIBIOTIC CONSULT NOTE - INITIAL  Pharmacy Consult for Vancomycin Indication: pneumonia  Allergies  Allergen Reactions  . Ramipril (Ramipril) Rash  . Tramadol Itching and Rash    Patient Measurements: Height: 5\' 7"  (170.2 cm) Weight: 171 lb 3.2 oz (77.656 kg) IBW/kg (Calculated) : 66.1   Vital Signs: Temp: 97.4 F (36.3 C) (05/26 0500) BP: 109/89 mmHg (05/26 0925) Pulse Rate: 82  (05/26 0500)  Labs:  Basename 11/19/11 0448 11/18/11 1817 11/18/11 1338  WBC -- 17.7* 16.3*  HGB -- 12.2* 12.9*  PLT -- 236 245  LABCREA -- -- --  CREATININE 0.77 -- 0.79   Estimated Creatinine Clearance: 102.1 ml/min (by C-G formula based on Cr of 0.77). No results found for this basename: VANCOTROUGH:2,VANCOPEAK:2,VANCORANDOM:2,GENTTROUGH:2,GENTPEAK:2,GENTRANDOM:2,TOBRATROUGH:2,TOBRAPEAK:2,TOBRARND:2,AMIKACINPEAK:2,AMIKACINTROU:2,AMIKACIN:2, in the last 72 hours   Microbiology:   5/26 - thoracentesis fluid sent for culture    Medical History: Past Medical History  Diagnosis Date  . HLD (hyperlipidemia)   . Lupus   . RA (rheumatoid arthritis)   . Hypertension   . Heart murmur   . Pneumonia 10/2011   Assessment:   Recent admission 5/14-5/16/13, treated with Azithromycin and Ceftriaxone for community-acquired pneumonia.   Hx rheumatoid arthritis and SLE. Currently on Prednisone 40 mg PO daily for possible RA flare.   S/p thoracentesis this morning, now to begin Vancomycin and Zoysn.  Fluid sent for culture.    Goal of Therapy:  Vancomycin trough level 15-20 mcg/ml  Plan:    Vancomycin 1 gram IV q8hrs.   Will follow-up culture data, clinical status & renal function.   Will consider checking vancomycin trough level if >5 days of therapy needed.    Will change Zosyn to 3.375 grams IV from q6hrs to q8hrs, each infused over 4 hours, per [facility] standard, after initial dose.  Marya Landry Pager: [phone] 11/19/2011,11:01 AM

## 2011-11-19 NOTE — Procedures (Signed)
Successful US guided right thoracentesis. Yielded of cloudy blood tinged fluid. Pt tolerated procedure well. No immediate complications.  Specimen was sent for labs. CXR ordered.  Brayton El PA-C 11/19/2011 9:21 AM

## 2011-11-19 NOTE — H&P (Signed)
Internal Medicine Teaching Service Attending Note Date: 11/19/2011  Patient name: [name] [name]  Medical record number: [id number]  Date of birth: 04/28/1961   I have seen and evaluated [name] [name] and discussed their care with the Residency Team. Please see Dr Raisch's H&P for full details. I agree with the formulated Assessment and Plan with the following changes:   [name] [name] was admitted May 14th through 16th for RLL CAP. He was treated with three days of IV rocephin and azithro and D/C'd to complete Azithro course. He was afebrile without a leukocytosis on D/C. He was feeling great after D/C (less cough and dyspnea) except for R thoracic pain that increased in severity. When he ran out of Vicodin, he presented to ER for excruciating pain. No fevers although admits to night sweats occasionally that were present prior to first admission. Weight loss of 4 lbs.   He was found to have a likely loculated R effusion on CT and was taken to IR this AM for thoracentesis but an effusion remains after 430 ml were drained. He tolerated the procedure well.  PMHX : RA - currently on prednisone and opioids. Was seeing Dr Nickola Major but there was a billing issue and pt has not been able to return. Plans on Enbrel at some point per pt. SLE - 1998 but per Dr Nickola Major 2012, all labs were negative so dx may not be accurate HTN Chronic pain disorder  Soc Hx: Smoked until last admission. About 25 pack year hx.  PE: Stable, NAD, no resp distress, not septic appearing 93% on RA, VSS HRRR No MRG LUNGS : decreased air movement on R Mid &  lower lung fields (after thoracentesis) ABD : +BS S/NT/ND Ext : no edema Neuro : no focal No hand joint abnl.  Labs : ABC 17.7 with ANC 11.5, HgB 12.2 CXR : likely loculated R pleural effusion  A/P 1. Pleural effusion, likely loculated - IR was unable to drain all the effusion. Fluid sent to studies. Will discuss with them whether lysis of adhesions and CT will be curative or if  VATS needed. Now that sample has been obtained, start Vanc / Zosyn to cover hospital acquired organisms (even though pt appears stable).  2. RA - continue prednisone. Will need outpt F/U  3. HTN - cont meds since pt is so stable.

## 2011-11-19 NOTE — Progress Notes (Signed)
INTERNAL MEDICINE DAILY PROGRESS NOTE  Kery Batzel MRN: [id number] DOB/AGE: 01/17/1961 51 y.o.  Admit date: 11/18/2011  Subjective: [name] [name] states he is feeling a little better today. SOB is a little better after thoracentesis.  Pain is still there but bearable.   Denies nausea, vomiting,  fever, chills, abdominal pain, swelling or other complaint.  Objective: Vital signs in last 24 hours: Filed Vitals:   11/19/11 0500 11/19/11 0847 11/19/11 0914 11/19/11 0925  BP: 114/82 125/94 117/85 109/89  Pulse: 82     Temp: 97.4 F (36.3 C)     TempSrc:      Resp: 18     Height:      Weight:      SpO2: 92%      Weight change:   Intake/Output Summary (Last 24 hours) at 11/19/11 1000 Last data filed at 11/18/11 2100  Gross per 24 hour  Intake    240 ml  Output      0 ml  Net    240 ml   Physical Exam: General: resting in bed Cardiac: RRR, no rubs, murmurs or gallops Pulm: Normal respiratory effort. Decreased expansion and breath sounds on the right. Bandage over right side thoracentesis site C/D/I Abd: soft, nontender, nondistended, BS present Ext: warm and well perfused, no pedal edema  Lab Results: Basic Metabolic Panel:  Lab 11/19/11 6295 11/18/11 1338  NA 134* 137  K 4.6 3.6  CL 96 99  CO2 28 27  GLUCOSE 130* 111*  BUN 15 15  CREATININE 0.77 0.79  CALCIUM 9.8 9.2  MG -- --  PHOS -- --   Liver Function Tests:  Lab 11/19/11 0448  AST 55*  ALT 86*  ALKPHOS 152*  BILITOT 0.3  PROT 7.4  ALBUMIN 2.8*   CBC:  Lab 11/18/11 1817 11/18/11 1338  WBC 17.7* 16.3*  NEUTROABS 11.5* --  HGB 12.2* 12.9*  HCT 38.2* 40.6  MCV 84.1 84.6  PLT 236 245   D-Dimer:  Lab 11/18/11 1540  DDIMER 2.62*   Coagulation:  Lab 11/18/11 1540  LABPROT 12.5  INR 0.91   Urinalysis:  Lab 11/18/11 1524  COLORURINE YELLOW  LABSPEC 1.017  PHURINE 6.0  GLUCOSEU NEGATIVE  HGBUR NEGATIVE  BILIRUBINUR NEGATIVE  KETONESUR NEGATIVE  PROTEINUR NEGATIVE  UROBILINOGEN 0.2    NITRITE NEGATIVE  LEUKOCYTESUR NEGATIVE    Micro Results: No results found for this or any previous visit (from the past 240 hour(s)). Studies/Results: Dg Chest 1 View  11/19/2011  *RADIOLOGY REPORT*  Clinical Data: Status post right thoracentesis  CHEST - 1 VIEW  Comparison: Chest radiograph 11/18/2011 and chest CT 11/18/2011  Findings: Likely a loculated right pleural effusion has decreased in size since chest radiograph of 11/18/2011. Right basilar atelectasis noted.  There is no visible pneumothorax, status post thoracentesis.  The left lung remains clear.  Stable cardiomegaly and mediastinal contours.  No acute bony abnormality.  IMPRESSION: 1.  Decrease in size of the probably loculated right pleural effusion, status post thoracentesis. 2.  Negative for pneumothorax.  Original Report Authenticated By: Britta Mccreedy, M.D.   Dg Chest 2 View  11/18/2011  *RADIOLOGY REPORT*  Clinical Data: Right lateral chest pain.  Shortness of breath.  CHEST - 2 VIEW  Comparison: 11/18/2011 at 1:17 p.m.  Findings: Similar right pleural effusion noted with adjacent airspace opacity secondary to atelectasis or pneumonia.  No change in subsegmental atelectasis or scarring at the left costophrenic angle.  Underlying cardiomegaly noted.  IMPRESSION:  1.  Stable appearance of the chest.  No pneumothorax observed.  Original Report Authenticated By: Dellia Cloud, M.D.   Dg Chest 2 View  11/18/2011  *RADIOLOGY REPORT*  Clinical Data: Right chest pain and chest pressure.  CHEST - 2 VIEW  Comparison: 11/07/2011  Findings: Worsening aeration at the right lung base noted, with suspected new right pleural effusion.  The previous right basilar airspace opacity, residual pneumonia or empyema cannot be completely excluded.  Minimal scarring noted along the lingula.  Cardiomegaly is present.  IMPRESSION:  1.  New moderate to large right pleural effusion with right basilar airspace opacity.  Given the original underlying  airspace opacity, the possibility of residual pneumonia or empyema cannot be totally excluded. 2.  Cardiomegaly.  Original Report Authenticated By: Dellia Cloud, M.D.   Ct Chest W Contrast  11/18/2011  *RADIOLOGY REPORT*  Clinical Data: Pneumonia.  Right pleuritic chest pain.  Pleural effusion.  Shortness of breath.  Cough.  CT CHEST WITH CONTRAST  Technique:  Multidetector CT imaging of the chest was performed following the standard protocol during bolus administration of intravenous contrast.  Contrast: 80mL OMNIPAQUE IOHEXOL 300 MG/ML  SOLN  Comparison: 11/18/2011  Findings: A moderate sized right pleural effusion is seen which has a multi lobulated contour suggesting loculations. No definite pleural enhancement or pleural based masses. The left lower lobe consolidation and volume loss is demonstrated.  Mild compressive atelectasis also seen involving the right middle and upper lobes.  The left lung is clear.  No left-sided pleural effusion identified. No evidence of centrally obstructing hilar masses or lymphadenopathy.  Shotty less than 1 cm mediastinal lymph nodes are seen, measuring up to 10 mm.  A 7 mm nodular densities seen within the anterior left upper lobe on image 26, and a 6 mm subsolid nodular density is also seen in the middle lobe on image 30.  IMPRESSION:  1.  Moderate right pleural effusion which is probably loculated. Empyema cannot be excluded. 2.  Right lower lobe consolidation and volume loss, with mild compressive atelectasis involving the right middle and upper lobes. 3.  Shotty mediastinal lymph nodes measuring up to 10 mm, likely reactive. 4.  Indeterminate bilateral pulmonary nodules largest measuring 7 mm in the left upper lobe.  If the patient is at high risk for bronchogenic carcinoma, follow-up chest CT at 3-6 months is recommended.  If the patient is at low risk for bronchogenic carcinoma, follow-up chest CT at 6-12 months is recommended.  This recommendation follows the  consensus statement: Guidelines for Management of Small Pulmonary Nodules Detected on CT Scans: A Statement from the Fleischner Society as published in Radiology 2005; 237:395-400.  Original Report Authenticated By: Danae Orleans, M.D.   US Thoracentesis Asp Pleural Space W/img Guide  11/19/2011  *RADIOLOGY REPORT*  Clinical Data:  Pneumonia, right-sided pleural effusion  ULTRASOUND GUIDED right THORACENTESIS  Comparison:  None  An ultrasound guided thoracentesis was thoroughly discussed with the patient and questions answered.  The benefits, risks, alternatives and complications were also discussed.  The patient understands and wishes to proceed with the procedure.  Written consent was obtained.  Ultrasound was performed to localize and mark an adequate pocket of fluid in the right chest. The effusion was noted to be loculated. The area was then prepped and draped in the normal sterile fashion. 1% Lidocaine was used for local anesthesia.  Under ultrasound guidance a 19 gauge Yueh catheter was introduced.  Thoracentesis was performed.  The catheter was removed and  a dressing applied.  Complications:  None  Findings: A total of approximately 430 ml of cloudy blood tinged fluid was removed. A fluid sample was sent for laboratory analysis.  IMPRESSION: Successful ultrasound guided right thoracentesis yielding 430 ml. of pleural fluid.  Read by Brayton El PA-C  Original Report Authenticated By: Richarda Overlie, M.D.   Medications: I have reviewed the patient's current medications. Scheduled Meds:    . docusate sodium  100 mg Oral BID  . HYDROcodone-acetaminophen  1 tablet Oral Once  . mulitivitamin with minerals  1 tablet Oral Daily  . nebivolol  5 mg Oral Daily  . piperacillin-tazobactam (ZOSYN)  IV  3.375 g Intravenous Q6H  . predniSONE  40 mg Oral Q breakfast  . sodium chloride  3 mL Intravenous Q12H  . vancomycin  1,000 mg Intravenous Q8H  . DISCONTD: enoxaparin  40 mg Subcutaneous Q24H  . DISCONTD:  predniSONE  40 mg Oral Q breakfast   Continuous Infusions:   . DISCONTD: sodium chloride 160 mL/hr (11/18/11 1639)   PRN Meds:.sodium chloride, alum & mag hydroxide-simeth, iohexol, ondansetron (ZOFRAN) IV, ondansetron, oxyCODONE, polyethylene glycol, sodium chloride, traZODone Assessment/Plan:  1) Loculated pleural effusion - Admitted for pain and pleural effusion. CT shows moderate, possibly loculated pleural effusion.IR did diagnostic and therapeutic thoracentesis which unfortunately showed that the effusion is indeed loculated and was only incompletely drained. Thoracic surgery consult obtained to evaluate for surgical evacuation. Vancomycin and Zosyn started 11/19/2011. Pleural LDH 1051 and a serum LDH 322. Clearly this is an exudative effusion/empyema with many WBCs (6140, predominantly neutrophils) -- Pain control with oxycodone IR and ibuprofen -- Vancomycin and Zosyn day 1 -- Follow surgery recommendations  2) rheumatoid arthritis - stable.We will continue the patient at his current dose of 40 mg prednisone for possible acute flare (2/2 left shoulder bursitis and chronic low back pain.   3) hypertension - blood pressure well controlled on home regimen.  -- Nebivolol 5 mg by mouth daily   4) history of SLE - unlikely to be SLE has been generally do very poorly and he is currently not on therapy besides prednisone. We will continue his current therapy.   DVT prophylaxis - SCDs until surgery sees the heparin if surgery is not imminent  Disposition: Pending surgical evaluation/correction for loculated parapneumonic effusion/empyema.   LOS: 1 day   Kenedy Haisley PGY-I, Internal Medicine Resident  Pager: [phone] (7AM-5PM) 11/19/2011, 10:00 AM

## 2011-11-19 NOTE — Consult Note (Signed)
[address]            [place] [address]          [phone]       [facility]iber[facility] Stansbery [facility] Medical Record #[id number] Date of Birth: 1961/01/15  Referring: No ref. provider found Primary Care: Kristie Cowman, MD, MD  Chief Complaint:    Chief Complaint  Patient presents with  . Back Pain    History of Present Illness:     The patient is a 51 year old Caucasian male nondiabetic smoker recently admitted for right lower lobe pneumonia treated with IV antibiotics for 3 days and discharged home on oral azithromycin. The patient's symptoms of productive cough and malaise improved however he had progressive right pleuritic chest pain requiring narcotics. When he ran out of narcotic medication he presented to the ED and a chest x-ray showed a moderate to large right pleural effusion associated with right lower lobe airspace disease. The patient had a CT scan of the chest which confirmed this to be a probable empyema and the patient was admitted and placed back on IV vancomycin and Zosyn. A ultrasound-guided thoracentesis removed450 cc of cloudy blood tinged fluid however the chest x-ray showed no significant improvement. A thoracic surgical consultation was requested for possible vats-decortication.  The patient denies prior history of significant pneumonia. The patient is chronically  Immuno- suppressed on chronic prednisone for Lupus.. Patient's most pack cigarettes daily. He also has multiple necrotic teeth which he wants to have extracted. There is no significant history prior chest trauma, spontaneous pneumothorax, or hemoptysis.have extracted eventually.   Current Activity/ Functional Status: Good   Past Medical History  Diagnosis Date  . HLD (hyperlipidemia)   . Lupus   . RA (rheumatoid arthritis)   . Hypertension   . Heart murmur   . Pneumonia 10/2011    Past Surgical History  Procedure Date  . Fracture surgery     plate in R arm  .  Right arm     plate    History  Smoking status  . Former Smoker -- 1.0 packs/day for 25 years  . Types: Cigarettes  . Quit date: 11/06/2011  Smokeless tobacco  . Never Used    History  Alcohol Use No   mild  History   Social History  . Marital Status: Single    Spouse Name: N/A    Number of Children: N/A  . Years of Education: N/A   Occupational History  . Not on file.   Social History Main Topics  . Smoking status: Former Smoker -- 1.0 packs/day for 25 years    Types: Cigarettes    Quit date: 11/06/2011  . Smokeless tobacco: Never Used  . Alcohol Use: No  . Drug Use: No  . Sexually Active: Not Currently   Other Topics Concern  . Not on file   Social History Narrative   Patient lives by himself, is on disability since 1992, used to work with [employer] [place]. 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 Smokes 1/2 PPD and has been smoking since 25 years. Drinks only occasionally.    Allergies  Allergen Reactions  . Ramipril (Ramipril) Rash  . Tramadol Itching and Rash    Current Facility-Administered Medications  Medication Dose Route Frequency Provider Last Rate Last Dose  . 0.9 %  sodium chloride infusion  250 mL Intravenous PRN Duwaine Maxin, MD      .  alum & mag hydroxide-simeth (MAALOX/MYLANTA) 200-200-20 MG/5ML suspension 30 mL  30 mL Oral Q6H PRN Duwaine Maxin, MD      . docusate sodium (COLACE) capsule 100 mg  100 mg Oral BID Duwaine Maxin, MD   100 mg at 11/19/11 1610  . HYDROcodone-acetaminophen (NORCO) 5-325 MG per tablet 1 tablet  1 tablet Oral Once Carleene Cooper III, MD   1 tablet at 11/18/11 1455  . ibuprofen (ADVIL,MOTRIN) tablet 600 mg  600 mg Oral Q6H PRN Duwaine Maxin, MD      . iohexol (OMNIPAQUE) 300 MG/ML solution 80 mL  80 mL Intravenous Once PRN Medication Radiologist, MD   80 mL at 11/18/11 1706  . mulitivitamin with minerals tablet 1 tablet  1 tablet Oral Daily Duwaine Maxin, MD   1 tablet at 11/19/11 0952  . nebivolol (BYSTOLIC) tablet  5 mg  5 mg Oral Daily Duwaine Maxin, MD   5 mg at 11/19/11 9604  . ondansetron (ZOFRAN) tablet 4 mg  4 mg Oral Q6H PRN Duwaine Maxin, MD       Or  . ondansetron [facility]) injection 4 mg  4 mg Intravenous Q6H PRN Duwaine Maxin, MD      . oxyCODONE (Oxy IR/ROXICODONE) immediate release tablet 5 mg  5 mg Oral Q4H PRN Duwaine Maxin, MD   5 mg at 11/19/11 1200  . piperacillin-tazobactam (ZOSYN) IVPB 3.375 g  3.375 g Intravenous Q6H Duwaine Maxin, MD   3.375 g at 11/19/11 1121  . piperacillin-tazobactam (ZOSYN) IVPB 3.375 g  3.375 g Intravenous Q8H Duwaine Maxin, MD      . polyethylene glycol (MIRALAX / GLYCOLAX) packet 17 g  17 g Oral Daily PRN Duwaine Maxin, MD      . predniSONE (DELTASONE) tablet 40 mg  40 mg Oral Q breakfast Duwaine Maxin, MD   40 mg at 11/19/11 1220  . sodium chloride 0.9 % injection 3 mL  3 mL Intravenous Q12H Duwaine Maxin, MD   3 mL at 11/19/11 1220  . sodium chloride 0.9 % injection 3 mL  3 mL Intravenous PRN Duwaine Maxin, MD      . traZODone (DESYREL) tablet 50 mg  50 mg Oral QHS PRN Duwaine Maxin, MD   50 mg at 11/18/11 2314  . vancomycin (VANCOCIN) IVPB 1000 mg/200 mL premix  1,000 mg Intravenous Q8H Duwaine Maxin, MD      . DISCONTD: 0.9 %  sodium chloride infusion   Intravenous Continuous Carleene Cooper III, MD 160 mL/hr at 11/18/11 1639 160 mL/hr at 11/18/11 1639  . DISCONTD: enoxaparin (LOVENOX) injection 40 mg  40 mg Subcutaneous Q24H Duwaine Maxin, MD      . DISCONTD: predniSONE (DELTASONE) tablet 40 mg  40 mg Oral Q breakfast Duwaine Maxin, MD        Prescriptions prior to admission  Medication Sig Dispense Refill  . acetaminophen (TYLENOL) 500 MG tablet Take 1 tablet (500 mg total) by mouth every 6 (six) hours as needed for pain or fever (or Fever >/= 101).  60 tablet  0  . guaiFENesin (MUCINEX) 600 MG 12 hr tablet Take 1,200 mg by mouth 2 (two) times daily.      . Multiple Vitamin (MULITIVITAMIN WITH MINERALS) TABS Take 1  tablet by mouth daily.      . nebivolol (BYSTOLIC) 5 MG tablet Take 1 tablet (5 mg total) by mouth daily.  30 tablet  1  . predniSONE (DELTASONE) 10 MG tablet Take 4 tablets daily for 4 days ( total 40 mg ), then 3 tablets daily for next 4 days ( total 30 mg), then 2 tablets ( 20 mg ) daily for next 4 days and then 10 mg daily ( 1 tablet ) until you come back to clinic.  100 tablet  0  . HYDROcodone-acetaminophen (NORCO) 5-325 MG per tablet Take 1 tablet by mouth every 4 (four) hours as needed.  20 tablet  0    Family History  Problem Relation Age of Onset  . Stroke Neg Hx   . Cancer Neg Hx      Review of Systems:     Cardiac Review of Systems: Y or N  Chest Pain [ y   ]  Resting SOB [ n  ] Exertional SOB  [y  ]y  Orthopnea [  n]   Pedal Edema [ n  ]    Palpitations [  ] Syncope  [  ]   Presyncope [   ]  General Review of Systems: [Y] = yes [  ]=no Constitional: recent weight change [ y ]; anorexia [  ]; fatigue [  ]; nausea [  ]; night sweats [  ]; fever Cove.Etienne  ]; or chills [  ];                                                                                                                                          Dental: poor dentition[y  ]; Last Dentist visit:> 1 yr  Eye : blurred vision [  ]; diplopia [   ]; vision changes [n  ];  Amaurosis fugax[  ]; Resp: cough Cove.Etienne  ];  wheezing[  ];  hemoptysis[n  ]; shortness of breath[  ]; paroxysmal nocturnal dyspnea[  ]; dyspnea on exertion[  ]; or orthopnea[  ];  GI:  gallstones[ n ], vomiting[  ];  dysphagia[  ]; melena[  ];  hematochezia [  ]; heartburn[  ];   Hx of  Colonoscopy[  ]; GU: kidney stones [  ]; hematuria[  ];   dysuria [  ];  nocturia[  ];  history of     obstruction [  ];             Skin: rash, swelling[  ];, hair loss[  ];  peripheral edema[  ];  or itching[  ]; Musculosketetal: myalgias[  ];  joint swelling[  ];  joint erythema[  ];  joint pain[  ];  back pain[  ];  Heme/Lymph: bruising[  ];  bleeding[n  ];  anemia[  ];  Neuro:  TIA[n ];  headaches[  ];  stroke[  ];  vertigo[  ];  seizures[  ];   paresthesias[  ];  difficulty walking[  ];  Psych:depression[  ]; anxiety[ n ];  Endocrine: diabetes[  ];  thyroid dysfunction[  ];  Immunizations: Flu [  ]; Pneumococcal[  ];  Other:  Physical Exam: BP 109/89  Pulse 82  Temp(Src) 97.4 F (36.3 C) (Oral)  Resp 18  Ht 5\' 7"  (1.702 m)  Wt 171 lb 3.2 oz (77.656 kg)  BMI 26.81 kg/m2  SpO2 92%  Exam Constitutional-middle-aged Caucasian male no acute distress HEENT-pupils equal dentition poor necrotic teeth in both maxilla and mandible Neck-no JVD mass or carotid bruit Lymphatics-not palpable nodes in the neck or supraclavicular fossa or eczema Thorax-tenderness along the right lateral chest wall needle aspiration site posterior to the tip of the right scapula, diminished breath sounds right base Cardiac regular rhythm without murmur or gallop Abdomen soft nontender without pulsatile mass Extremities-mild clubbing no cyanosis edema or tenderness Vascular palpable pulses in the extremities Neurologic-alert nonfocal left-hand-dominant-    Diagnostic Studies & Laboratory data:   7 mm nodule left upper lobe on CT scan appears low risk  Recent Radiology Findings:   Dg Chest 1 View  11/19/2011  *RADIOLOGY REPORT*  Clinical Data: Status post right thoracentesis  CHEST - 1 VIEW  Comparison: Chest radiograph 11/18/2011 and chest CT 11/18/2011  Findings: Likely a loculated right pleural effusion has decreased in size since chest radiograph of 11/18/2011. Right basilar atelectasis noted.  There is no visible pneumothorax, status post thoracentesis.  The left lung remains clear.  Stable cardiomegaly and mediastinal contours.  No acute bony abnormality.  IMPRESSION: 1.  Decrease in size of the probably loculated right pleural effusion, status post thoracentesis. 2.  Negative for pneumothorax.  Original Report Authenticated By: Britta Mccreedy, M.D.   Dg Chest 2 View  11/18/2011   *RADIOLOGY REPORT*  Clinical Data: Right lateral chest pain.  Shortness of breath.  CHEST - 2 VIEW  Comparison: 11/18/2011 at 1:17 p.m.  Findings: Similar right pleural effusion noted with adjacent airspace opacity secondary to atelectasis or pneumonia.  No change in subsegmental atelectasis or scarring at the left costophrenic angle.  Underlying cardiomegaly noted.  IMPRESSION:  1.  Stable appearance of the chest.  No pneumothorax observed.  Original Report Authenticated By: Dellia Cloud, M.D.   Dg Chest 2 View  11/18/2011  *RADIOLOGY REPORT*  Clinical Data: Right chest pain and chest pressure.  CHEST - 2 VIEW  Comparison: 11/07/2011  Findings: Worsening aeration at the right lung base noted, with suspected new right pleural effusion.  The previous right basilar airspace opacity, residual pneumonia or empyema cannot be completely excluded.  Minimal scarring noted along the lingula.  Cardiomegaly is present.  IMPRESSION:  1.  New moderate to large right pleural effusion with right basilar airspace opacity.  Given the original underlying airspace opacity, the possibility of residual pneumonia or empyema cannot be totally excluded. 2.  Cardiomegaly.  Original Report Authenticated By: Dellia Cloud, M.D.   Ct Chest W Contrast  11/18/2011  *RADIOLOGY REPORT*  Clinical Data: Pneumonia.  Right pleuritic chest pain.  Pleural effusion.  Shortness of breath.  Cough.  CT CHEST WITH CONTRAST  Technique:  Multidetector CT imaging of the chest was performed following the standard protocol during bolus administration of intravenous contrast.  Contrast: 80mL OMNIPAQUE IOHEXOL 300 MG/ML  SOLN  Comparison: 11/18/2011  Findings: A moderate sized right pleural effusion is seen which has a multi lobulated contour suggesting loculations. No definite pleural enhancement or pleural based masses. The left lower lobe consolidation and volume loss is demonstrated.  Mild compressive atelectasis also seen involving the  right middle and upper  lobes.  The left lung is clear.  No left-sided pleural effusion identified. No evidence of centrally obstructing hilar masses or lymphadenopathy.  Shotty less than 1 cm mediastinal lymph nodes are seen, measuring up to 10 mm.  A 7 mm nodular densities seen within the anterior left upper lobe on image 26, and a 6 mm subsolid nodular density is also seen in the middle lobe on image 30.  IMPRESSION:  1.  Moderate right pleural effusion which is probably loculated. Empyema cannot be excluded. 2.  Right lower lobe consolidation and volume loss, with mild compressive atelectasis involving the right middle and upper lobes. 3.  Shotty mediastinal lymph nodes measuring up to 10 mm, likely reactive. 4.  Indeterminate bilateral pulmonary nodules largest measuring 7 mm in the left upper lobe.  If the patient is at high risk for bronchogenic carcinoma, follow-up chest CT at 3-6 months is recommended.  If the patient is at low risk for bronchogenic carcinoma, follow-up chest CT at 6-12 months is recommended.  This recommendation follows the consensus statement: Guidelines for Management of Small Pulmonary Nodules Detected on CT Scans: A Statement from the Fleischner Society as published in Radiology 2005; 237:395-400.  Original Report Authenticated By: Danae Orleans, M.D.   US Thoracentesis Asp Pleural Space W/img Guide  11/19/2011  *RADIOLOGY REPORT*  Clinical Data:  Pneumonia, right-sided pleural effusion  ULTRASOUND GUIDED right THORACENTESIS  Comparison:  None  An ultrasound guided thoracentesis was thoroughly discussed with the patient and questions answered.  The benefits, risks, alternatives and complications were also discussed.  The patient understands and wishes to proceed with the procedure.  Written consent was obtained.  Ultrasound was performed to localize and mark an adequate pocket of fluid in the right chest. The effusion was noted to be loculated. The area was then prepped and draped in  the normal sterile fashion. 1% Lidocaine was used for local anesthesia.  Under ultrasound guidance a 19 gauge Yueh catheter was introduced.  Thoracentesis was performed.  The catheter was removed and a dressing applied.  Complications:  None  Findings: A total of approximately 430 ml of cloudy blood tinged fluid was removed. A fluid sample was sent for laboratory analysis.  IMPRESSION: Successful ultrasound guided right thoracentesis yielding 430 ml. of pleural fluid.  Read by Brayton El PA-C  Original Report Authenticated By: Richarda Overlie, M.D.      Recent Lab Findings:     Assessment / Plan:      Right empyema in a smoker who is immunosuppressed on chronic steroids for lupus. He would benefit from right vats and decortication. Certainly scheduled for May 28. We'll obtain preoperative 2-D echo, PFTs and ABGs prior to surgery. Thank you for consultation.      @[email or web address] @ 11/19/2011 1:15 PM

## 2011-11-20 ENCOUNTER — Encounter (HOSPITAL_COMMUNITY): Payer: Self-pay | Admitting: Internal Medicine

## 2011-11-20 ENCOUNTER — Inpatient Hospital Stay (HOSPITAL_COMMUNITY): Payer: Medicare HMO

## 2011-11-20 DIAGNOSIS — J869 Pyothorax without fistula: Secondary | ICD-10-CM

## 2011-11-20 LAB — COMPREHENSIVE METABOLIC PANEL
ALT: 79 U/L — ABNORMAL HIGH (ref 0–53)
BUN: 18 mg/dL (ref 6–23)
CO2: 26 mEq/L (ref 19–32)
Chloride: 99 mEq/L (ref 96–112)
Creatinine, Ser: 0.72 mg/dL (ref 0.50–1.35)
GFR calc non Af Amer: >90 mL/min (ref >90)
Total Bilirubin: 0.2 mg/dL — ABNORMAL LOW (ref 0.3–1.2)

## 2011-11-20 LAB — CBC
HCT: 37.3 % — ABNORMAL LOW (ref 39.0–52.0)
Hemoglobin: 12.1 g/dL — ABNORMAL LOW (ref 13.0–17.0)
MCH: 26.9 pg (ref 26.0–34.0)
MCHC: 32.4 g/dL (ref 30.0–36.0)
MCV: 83.1 fL (ref 78.0–100.0)
RDW: 14.9 % (ref 11.5–15.5)

## 2011-11-20 LAB — CARDIAC PANEL(CRET KIN+CKTOT+MB+TROPI)
CK, MB: 0.9 ng/mL (ref 0.3–4.0)
Relative Index: INVALID (ref 0.0–2.5)
Total CK: 12 U/L (ref 7–232)
Troponin I: <0.3 ng/mL (ref <0.30)

## 2011-11-20 LAB — TSH: TSH: 0.782 u[IU]/mL (ref 0.350–4.500)

## 2011-11-20 LAB — FUNGAL STAIN

## 2011-11-20 MED ORDER — HYDROCORTISONE SOD SUCCINATE 100 MG PF FOR IT USE: 50.0000 mg | Freq: Once | INTRAMUSCULAR | Status: DC | PRN

## 2011-11-20 MED ORDER — HYDROCORTISONE SOD SUCCINATE 100 MG PF FOR IT USE: 25.0000 mg | Freq: Three times a day (TID) | INTRAMUSCULAR | Status: DC

## 2011-11-20 MED ORDER — HYDROCORTISONE SOD SUCCINATE 100 MG IJ SOLR
25.0000 mg | Freq: Three times a day (TID) | INTRAMUSCULAR | Status: AC
  Administered 2011-11-22 – 2011-11-24 (×7): 25 mg via INTRAVENOUS
  Filled 2011-11-20 (×10): qty 0.5

## 2011-11-20 MED ORDER — MIDAZOLAM HCL 2 MG/2ML IJ SOLN: 1.0000 mg | INTRAMUSCULAR | Status: DC | PRN

## 2011-11-20 MED ORDER — NICOTINE 21 MG/24HR TD PT24
21.0000 mg | MEDICATED_PATCH | Freq: Every day | TRANSDERMAL | Status: DC
  Administered 2011-11-20 – 2011-11-26 (×6): 21 mg via TRANSDERMAL
  Filled 2011-11-20 (×7): qty 1

## 2011-11-20 MED ORDER — HYDROCORTISONE SOD SUCCINATE 100 MG IJ SOLR
50.0000 mg | Freq: Once | INTRAMUSCULAR | Status: DC
  Filled 2011-11-20: qty 1

## 2011-11-20 MED ORDER — KETOROLAC TROMETHAMINE 30 MG/ML IJ SOLN
30.0000 mg | Freq: Four times a day (QID) | INTRAMUSCULAR | Status: AC | PRN
  Administered 2011-11-21 – 2011-11-23 (×3): 30 mg via INTRAVENOUS
  Filled 2011-11-20 (×4): qty 1

## 2011-11-20 MED ORDER — MORPHINE SULFATE 4 MG/ML IJ SOLN: 4.0000 mg | INTRAMUSCULAR | Status: DC | PRN

## 2011-11-20 MED ORDER — HYDROCORTISONE SOD SUCCINATE 100 MG IJ SOLR: 50.0000 mg | Freq: Three times a day (TID) | INTRAMUSCULAR | Status: DC

## 2011-11-20 MED ORDER — IBUPROFEN 600 MG PO TABS
600.0000 mg | ORAL_TABLET | Freq: Four times a day (QID) | ORAL | Status: DC | PRN
  Filled 2011-11-20: qty 1

## 2011-11-20 MED ORDER — FENTANYL CITRATE 0.05 MG/ML IJ SOLN: 50.0000 ug | INTRAMUSCULAR | Status: DC | PRN

## 2011-11-20 NOTE — Progress Notes (Signed)
Resident Co-sign Daily Note: I have seen the patient and reviewed the daily progress note by Lewie Chamber and discussed the care of the patient with them.  See below for documentation of my findings, assessment, and plans.  Subjective:  Feels good today. Pain is 5/10 but manageable. Worse with inspiration. He has not gotten out of bed very much. Has not been doing incentives bronchial tree because it hurts.  Objective: Vital signs in last 24 hours: Filed Vitals:   11/19/11 0925 11/19/11 1400 11/19/11 2100 11/20/11 0600  BP: 109/89 119/80 121/88 116/89  Pulse:  79 76 82  Temp:  98.1 F (36.7 C) 97.8 F (36.6 C) 97.6 F (36.4 C)  TempSrc:  Oral    Resp:  20 18 15   Height:      Weight:      SpO2:  96% 94% 94%   Physical Exam: General: resting in bed  Cardiac: RRR, no rubs, murmurs or gallops Pulm: Decreased expansion on the right and decreased breath sounds on the right as well. Inspiratory crackles at the base on the left. Abd: soft, nontender, nondistended, BS present Ext: warm and well perfused, no pedal edema Neuro: alert and oriented X3, cranial nerves II-XII grossly intact  Lab Results: Reviewed and documented in Electronic Record Micro Results: Reviewed and documented in Electronic Record Studies/Results: Reviewed and documented in Electronic Record Medications: I have reviewed the patient's current medications. Scheduled Meds:   . docusate sodium  100 mg Oral BID  . mulitivitamin with minerals  1 tablet Oral Daily  . nebivolol  5 mg Oral Daily  . piperacillin-tazobactam (ZOSYN)  IV  3.375 g Intravenous Q6H  . piperacillin-tazobactam (ZOSYN)  IV  3.375 g Intravenous Q8H  . predniSONE  40 mg Oral Q breakfast  . sodium chloride  3 mL Intravenous Q12H  . vancomycin  1,000 mg Intravenous Q8H  . DISCONTD: cefUROXime (ZINACEF)  IV  1.5 g Intravenous 60 min Pre-Op  . DISCONTD: enoxaparin (LOVENOX) injection  40 mg Subcutaneous Q24H   Continuous Infusions:  PRN  Meds:.sodium chloride, alum & mag hydroxide-simeth, ibuprofen, ketorolac, morphine injection, ondansetron (ZOFRAN) IV, ondansetron, oxyCODONE, polyethylene glycol, sodium chloride, traZODone Assessment/Plan:  1) Loculated pleural effusion - Admitted for pain and pleural effusion. CT showed moderate, possibly loculated pleural effusion. 11/19/2011 IR did diagnostic and therapeutic thoracentesis which unfortunately showed that the effusion is indeed loculated and was only incompletely drained. Thoracic surgery consult obtained to evaluate for surgical evacuation and will perform decortication tomorrow. Vancomycin and Zosyn started 11/19/2011. Pleural LDH 1051 and a serum LDH 322. Clearly this is an exudative effusion/empyema with many WBCs (6140, predominantly neutrophils)  -- Pain control with oxycodone IR and ibuprofen. Will switch to IV pain control at midnight -- Vancomycin and Zosyn day 2 --  n.p.o. after midnight -- Lovenox dose held tonight  2) rheumatoid arthritis - stable.We will continue the patient at his current dose of 40 mg prednisone for possible acute flare (2/2 left shoulder bursitis and chronic low back pain.   3) hypertension - blood pressure well controlled on home regimen.  -- Nebivolol 5 mg by mouth daily   4) history of SLE - unlikely to be SLE has been generally do very poorly and he is currently not on therapy besides prednisone. We will continue his current therapy.   DVT prophylaxis -SCDs after midnight Disposition: Pending surgical evaluation/correction for loculated parapneumonic effusion/empyema.    LOS: 2 days   Avayah Raffety 11/20/2011, 9:35 AM

## 2011-11-20 NOTE — Progress Notes (Signed)
Internal Medicine Teaching Service Attending Note Date: 11/20/2011  Patient name: [name] [name]  Medical record number: [id number]  Date of birth: January 05, 1961    This patient has been seen and discussed with the house staff. Please see their note for complete details. I concur with their findings with the following additions/corrections:  [name] continues to better. His pain is 5/10, down from 10/10. His cough cont to be improved. He was able to eat a decent breakfast. CVTS has eval pt and is planned for VATS on the 28th.   He seems to have been on prednisone 5 mg QD since October although reprted to have been out of on one month prior to first May admission. Was put on stress dose steroids at admit of 50 mg and sent home on slow taper. Currently on 40mg . Recs for stress dose steroids for surgery differ abd it is not clear what his tx should be as he falls in the area of disagreement. Since pt is having invasive procedure, & is not diabetic, the benefits of an increased dose (hydrocortisone, 50 mg preoperatively followed by 25 mg every 8 hours for 1 to 2 days) might be higher than the risks. Will discuss with primary team.  Jameila Keeny 11/20/2011, 10:01 AM

## 2011-11-20 NOTE — Progress Notes (Signed)
Medical Student Daily Progress Note  Subjective: Patient slept well overnight. Reports he feels well and is ready for his procedure and getting home soon after. He has been up and walking this morning down the hallways with no SOB. He is resting comfortably in bed upon exam.  Objective: Vital signs in last 24 hours: Filed Vitals:   11/19/11 0925 11/19/11 1400 11/19/11 2100 11/20/11 0600  BP: 109/89 119/80 121/88 116/89  Pulse:  79 76 82  Temp:  98.1 F (36.7 C) 97.8 F (36.6 C) 97.6 F (36.4 C)  TempSrc:  Oral    Resp:  20 18 15   Height:      Weight:      SpO2:  96% 94% 94%   Weight change:   Intake/Output Summary (Last 24 hours) at 11/20/11 0850 Last data filed at 11/19/11 2100  Gross per 24 hour  Intake    120 ml  Output      0 ml  Net    120 ml   Physical Exam: BP 116/89  Pulse 82  Temp(Src) 97.6 F (36.4 C) (Oral)  Resp 15  Ht 5\' 7"  (1.702 m)  Wt 77.656 kg (171 lb 3.2 oz)  BMI 26.81 kg/m2  SpO2 94% General appearance: alert, cooperative and no distress Head: Normocephalic, without obvious abnormality, atraumatic Neck: no adenopathy, no carotid bruit, no JVD, supple, symmetrical, trachea midline and thyroid not enlarged, symmetric, no tenderness/mass/nodules Back: symmetric, no curvature. ROM normal. No CVA tenderness. Lungs: decreased breath sounds RML/RLL; Left lung clear to auscultation Chest wall: right sided chest wall tenderness Heart: regular rate and rhythm, S1, S2 normal, no murmur, click, rub or gallop Abdomen: soft, non-tender; bowel sounds normal; no masses,  no organomegaly Extremities: extremities normal, atraumatic, no cyanosis or edema Skin: Skin color, texture, turgor normal. No rashes or lesions Lab Results: Basic Metabolic Panel:  Lab 11/20/11 4098 11/19/11 1413  NA 138 137  K 4.0 4.3  CL 99 97  CO2 26 29  GLUCOSE 107* 114*  BUN 18 18  CREATININE 0.72 0.83  CALCIUM 9.9 9.7  MG -- --  PHOS -- --   Liver Function Tests:  Lab  11/20/11 0535 11/19/11 1413  AST 37 36  ALT 79* 76*  ALKPHOS 139* 141*  BILITOT 0.2* 0.3  PROT 7.5 7.3  ALBUMIN 2.7* 2.7*   No results found for this basename: LIPASE:2,AMYLASE:2 in the last 168 hours No results found for this basename: AMMONIA:2 in the last 168 hours CBC:  Lab 11/19/11 1413 11/19/11 1150 11/18/11 1817  WBC 20.0* 20.5* --  NEUTROABS -- 15.2* 11.5*  HGB 12.9* 12.8* --  HCT 39.3 39.5 --  MCV 82.4 82.5 --  PLT 255 252 --   Cardiac Enzymes:  Lab 11/19/11 2329 11/19/11 1804 11/19/11 1151  CKTOTAL 12 15 13   CKMB 0.9 0.9 0.8  CKMBINDEX -- -- --  TROPONINI <0.30 <0.30 <0.30   BNP: No results found for this basename: PROBNP:3 in the last 168 hours D-Dimer:  Lab 11/18/11 1540  DDIMER 2.62*   CBG: No results found for this basename: GLUCAP:6 in the last 168 hours Hemoglobin A1C:  Lab 11/19/11 1413  HGBA1C 5.8*   Fasting Lipid Panel: No results found for this basename: CHOL,HDL,LDLCALC,TRIG,CHOLHDL,LDLDIRECT in the last 119 hours Thyroid Function Tests: No results found for this basename: TSH,T4TOTAL,FREET4,T3FREE,THYROIDAB in the last 168 hours Coagulation:  Lab 11/19/11 1413 11/18/11 1540  LABPROT 12.6 12.5  INR 0.92 0.91   Anemia Panel: No results found for  this basename: VITAMINB12,FOLATE,FERRITIN,TIBC,IRON,RETICCTPCT in the last 168 hours Urine Drug Screen: Drugs of Abuse     Component Value Date/Time   LABOPIA NEG 08/30/2010 1653   LABOPIA POS* 02/22/2010 0000   COCAINSCRNUR NEG 08/30/2010 1653   COCAINSCRNUR NONE DETECTED 07/24/2009 1648   LABBENZ POS* 08/30/2010 1653   LABBENZ POSITIVE* 07/24/2009 1648   AMPHETMU NEG 08/30/2010 1653   AMPHETMU NONE DETECTED 07/24/2009 1648   THCU NONE DETECTED 07/24/2009 1648   LABBARB  Value: NONE DETECTED        DRUG SCREEN FOR MEDICAL PURPOSES ONLY.  IF CONFIRMATION IS NEEDED FOR ANY PURPOSE, NOTIFY LAB WITHIN 5 DAYS.        LOWEST DETECTABLE LIMITS FOR URINE DRUG SCREEN Drug Class       Cutoff (ng/mL) Amphetamine       1000 Barbiturate      200 Benzodiazepine   200 Tricyclics       300 Opiates          300 Cocaine          300 THC              50 07/24/2009 1648    Alcohol Level: No results found for this basename: ETH:2 in the last 168 hours Urinalysis:  Lab 11/19/11 1740 11/18/11 1524  COLORURINE YELLOW YELLOW  LABSPEC 1.013 1.017  PHURINE 7.0 6.0  GLUCOSEU NEGATIVE NEGATIVE  HGBUR NEGATIVE NEGATIVE  BILIRUBINUR NEGATIVE NEGATIVE  KETONESUR NEGATIVE NEGATIVE  PROTEINUR NEGATIVE NEGATIVE  UROBILINOGEN 0.2 0.2  NITRITE NEGATIVE NEGATIVE  LEUKOCYTESUR NEGATIVE NEGATIVE   Micro Results: Recent Results (from the past 240 hour(s))  SURGICAL PCR SCREEN     Status: Normal   Collection Time   11/19/11  2:16 PM      Component Value Range Status Comment   MRSA, PCR NEGATIVE  NEGATIVE  Final    Staphylococcus aureus NEGATIVE  NEGATIVE  Final    Studies/Results: Dg Chest 1 View  11/19/2011  *RADIOLOGY REPORT*  Clinical Data: Status post right thoracentesis  CHEST - 1 VIEW  Comparison: Chest radiograph 11/18/2011 and chest CT 11/18/2011  Findings: Likely a loculated right pleural effusion has decreased in size since chest radiograph of 11/18/2011. Right basilar atelectasis noted.  There is no visible pneumothorax, status post thoracentesis.  The left lung remains clear.  Stable cardiomegaly and mediastinal contours.  No acute bony abnormality.  IMPRESSION: 1.  Decrease in size of the probably loculated right pleural effusion, status post thoracentesis. 2.  Negative for pneumothorax.  Original Report Authenticated By: Britta Mccreedy, M.D.   Dg Chest 2 View  11/18/2011  *RADIOLOGY REPORT*  Clinical Data: Right lateral chest pain.  Shortness of breath.  CHEST - 2 VIEW  Comparison: 11/18/2011 at 1:17 p.m.  Findings: Similar right pleural effusion noted with adjacent airspace opacity secondary to atelectasis or pneumonia.  No change in subsegmental atelectasis or scarring at the left costophrenic angle.   Underlying cardiomegaly noted.  IMPRESSION:  1.  Stable appearance of the chest.  No pneumothorax observed.  Original Report Authenticated By: Dellia Cloud, M.D.   Dg Chest 2 View  11/18/2011  *RADIOLOGY REPORT*  Clinical Data: Right chest pain and chest pressure.  CHEST - 2 VIEW  Comparison: 11/07/2011  Findings: Worsening aeration at the right lung base noted, with suspected new right pleural effusion.  The previous right basilar airspace opacity, residual pneumonia or empyema cannot be completely excluded.  Minimal scarring noted along the lingula.  Cardiomegaly is present.  IMPRESSION:  1.  New moderate to large right pleural effusion with right basilar airspace opacity.  Given the original underlying airspace opacity, the possibility of residual pneumonia or empyema cannot be totally excluded. 2.  Cardiomegaly.  Original Report Authenticated By: Dellia Cloud, M.D.   Ct Chest W Contrast  11/18/2011  *RADIOLOGY REPORT*  Clinical Data: Pneumonia.  Right pleuritic chest pain.  Pleural effusion.  Shortness of breath.  Cough.  CT CHEST WITH CONTRAST  Technique:  Multidetector CT imaging of the chest was performed following the standard protocol during bolus administration of intravenous contrast.  Contrast: 80mL OMNIPAQUE IOHEXOL 300 MG/ML  SOLN  Comparison: 11/18/2011  Findings: A moderate sized right pleural effusion is seen which has a multi lobulated contour suggesting loculations. No definite pleural enhancement or pleural based masses. The left lower lobe consolidation and volume loss is demonstrated.  Mild compressive atelectasis also seen involving the right middle and upper lobes.  The left lung is clear.  No left-sided pleural effusion identified. No evidence of centrally obstructing hilar masses or lymphadenopathy.  Shotty less than 1 cm mediastinal lymph nodes are seen, measuring up to 10 mm.  A 7 mm nodular densities seen within the anterior left upper lobe on image 26, and a 6 mm  subsolid nodular density is also seen in the middle lobe on image 30.  IMPRESSION:  1.  Moderate right pleural effusion which is probably loculated. Empyema cannot be excluded. 2.  Right lower lobe consolidation and volume loss, with mild compressive atelectasis involving the right middle and upper lobes. 3.  Shotty mediastinal lymph nodes measuring up to 10 mm, likely reactive. 4.  Indeterminate bilateral pulmonary nodules largest measuring 7 mm in the left upper lobe.  If the patient is at high risk for bronchogenic carcinoma, follow-up chest CT at 3-6 months is recommended.  If the patient is at low risk for bronchogenic carcinoma, follow-up chest CT at 6-12 months is recommended.  This recommendation follows the consensus statement: Guidelines for Management of Small Pulmonary Nodules Detected on CT Scans: A Statement from the Fleischner Society as published in Radiology 2005; 237:395-400.  Original Report Authenticated By: Danae Orleans, M.D.   US Thoracentesis Asp Pleural Space W/img Guide  11/19/2011  *RADIOLOGY REPORT*  Clinical Data:  Pneumonia, right-sided pleural effusion  ULTRASOUND GUIDED right THORACENTESIS  Comparison:  None  An ultrasound guided thoracentesis was thoroughly discussed with the patient and questions answered.  The benefits, risks, alternatives and complications were also discussed.  The patient understands and wishes to proceed with the procedure.  Written consent was obtained.  Ultrasound was performed to localize and mark an adequate pocket of fluid in the right chest. The effusion was noted to be loculated. The area was then prepped and draped in the normal sterile fashion. 1% Lidocaine was used for local anesthesia.  Under ultrasound guidance a 19 gauge Yueh catheter was introduced.  Thoracentesis was performed.  The catheter was removed and a dressing applied.  Complications:  None  Findings: A total of approximately 430 ml of cloudy blood tinged fluid was removed. A fluid  sample was sent for laboratory analysis.  IMPRESSION: Successful ultrasound guided right thoracentesis yielding 430 ml. of pleural fluid.  Read by Brayton El PA-C  Original Report Authenticated By: Richarda Overlie, M.D.   Medications:  I have reviewed the patient's current medications. Prior to Admission:  Prescriptions prior to admission  Medication Sig Dispense Refill  . acetaminophen (TYLENOL) 500 MG tablet Take  1 tablet (500 mg total) by mouth every 6 (six) hours as needed for pain or fever (or Fever >/= 101).  60 tablet  0  . guaiFENesin (MUCINEX) 600 MG 12 hr tablet Take 1,200 mg by mouth 2 (two) times daily.      . Multiple Vitamin (MULITIVITAMIN WITH MINERALS) TABS Take 1 tablet by mouth daily.      . nebivolol (BYSTOLIC) 5 MG tablet Take 1 tablet (5 mg total) by mouth daily.  30 tablet  1  . predniSONE (DELTASONE) 10 MG tablet Take 4 tablets daily for 4 days ( total 40 mg ), then 3 tablets daily for next 4 days ( total 30 mg), then 2 tablets ( 20 mg ) daily for next 4 days and then 10 mg daily ( 1 tablet ) until you come back to clinic.  100 tablet  0  . HYDROcodone-acetaminophen (NORCO) 5-325 MG per tablet Take 1 tablet by mouth every 4 (four) hours as needed.  20 tablet  0   Scheduled Meds:   . docusate sodium  100 mg Oral BID  . enoxaparin (LOVENOX) injection  40 mg Subcutaneous Q24H  . mulitivitamin with minerals  1 tablet Oral Daily  . nebivolol  5 mg Oral Daily  . piperacillin-tazobactam (ZOSYN)  IV  3.375 g Intravenous Q6H  . piperacillin-tazobactam (ZOSYN)  IV  3.375 g Intravenous Q8H  . predniSONE  40 mg Oral Q breakfast  . sodium chloride  3 mL Intravenous Q12H  . vancomycin  1,000 mg Intravenous Q8H  . DISCONTD: cefUROXime (ZINACEF)  IV  1.5 g Intravenous 60 min Pre-Op   Continuous Infusions:  PRN Meds:.sodium chloride, alum & mag hydroxide-simeth, ibuprofen, ondansetron (ZOFRAN) IV, ondansetron, oxyCODONE, polyethylene glycol, sodium chloride,  traZODone Assessment/Plan:  Pleural effusion, loculated - Patient underwent US guided thoracentesis on 11/19/11 (430 cc pulled off) that revealed exudative effusion/empyema per Light's criteria. Pleural LDH/serum LDH = 3.26.  Pleural protein/serum protein = 0.67. Increased pleural fluid WBC (6140) with neutrophilia (67%). - Appreciate TCTS consult in the management of this patient - VATS & decortication 11/21/11 - continue Vanc (5/26>>) and Zosyn (5/26>>) - continue ibuprofen and Oxy IR for pain - NPO after midnight (will give IV pain meds at that time and after surgery temporarily)  Rheumatoid arthritis - Patient has history of RA, well controlled on his home regimen. Stable. - continue prednisone 40 mg daily  Hypertension - Patient is well controlled at home PTA. Stable. - continue nebivolol  SLE - Patient has history of SLE, currently stable and no management needed.  - last known therapy about 4 years ago  DVT ppx - Lovenox  -will hold dose today for procedure tomorrow and resume when clear w/ VVS  Disposition - He will undergo VATS and decortication tomorrow, 11/21/11.   LOS: 2 days   This is a Psychologist, occupational Note.  The care of the patient was discussed with Dr. Quentin Ore and the assessment and plan formulated with their assistance.  Please see their attached note for official documentation of the daily encounter.  Lewie Chamber 11/20/2011, 8:50 AM

## 2011-11-20 NOTE — Progress Notes (Signed)
Procedure(s) (LRB): VIDEO ASSISTED THORACOSCOPY (VATS) W/TALC PLEUADESIS (Right)                    [address]            [name] [address]          [phone]      Subjective  Rll pneumonia, empyema CXR shows improvement but some residual effusion Due to immunosupresion on chronic steroids and because of the infected nature of tje fluid I rec proceeding with drainage procedure tomorrow Objective: Vital signs in last 24 hours: Temp:  [97.6 F (36.4 C)-98.1 F (36.7 C)] 97.6 F (36.4 C) (05/27 0600) Pulse Rate:  [76-82] 82  (05/27 0600) Cardiac Rhythm:  [-] Normal sinus rhythm (05/27 0945) Resp:  [15-20] 15  (05/27 0600) BP: (116-121)/(80-89) 116/89 mmHg (05/27 0600) SpO2:  [94 %-96 %] 94 % (05/27 0600)  Hemodynamic parameters for last 24 hours:  nsr  Intake/Output from previous day: 05/26 0701 - 05/27 0700 In: 120 [P.O.:120] Out: -  Intake/Output this shift:    EXAM Dec breath sounds R base  Lab Results:  Basename 11/19/11 1413 11/19/11 1150  WBC 20.0* 20.5*  HGB 12.9* 12.8*  HCT 39.3 39.5  PLT 255 252   BMET:  Basename 11/20/11 0535 11/19/11 1413  NA 138 137  K 4.0 4.3  CL 99 97  CO2 26 29  GLUCOSE 107* 114*  BUN 18 18  CREATININE 0.72 0.83  CALCIUM 9.9 9.7    PT/INR:  Basename 11/19/11 1413  LABPROT 12.6  INR 0.92   ABG    Component Value Date/Time   PHART 7.467* 11/19/2011 1535   HCO3 27.3* 11/19/2011 1535   TCO2 28.5 11/19/2011 1535   O2SAT 93.3 11/19/2011 1535   CBG (last 3)  No results found for this basename: GLUCAP:3 in the last 72 hours  Assessment/Plan: S/P Procedure(s) (LRB): VIDEO ASSISTED THORACOSCOPY (VATS) W/TALC PLEUADESIS (Right) R VATS tomorrow   LOS: 2 days    [name] [name],[name] [name] 11/20/2011

## 2011-11-21 ENCOUNTER — Inpatient Hospital Stay (HOSPITAL_COMMUNITY): Payer: Medicare HMO

## 2011-11-21 ENCOUNTER — Encounter (HOSPITAL_COMMUNITY): Payer: Self-pay | Admitting: Anesthesiology

## 2011-11-21 ENCOUNTER — Encounter (HOSPITAL_COMMUNITY)
Admission: EM | Disposition: A | Discharge status: Home / Self Care | Payer: Self-pay | Source: Home / Self Care | Attending: Cardiothoracic Surgery

## 2011-11-21 ENCOUNTER — Inpatient Hospital Stay (HOSPITAL_COMMUNITY): Payer: Medicare HMO | Admitting: Anesthesiology

## 2011-11-21 DIAGNOSIS — J869 Pyothorax without fistula: Secondary | ICD-10-CM

## 2011-11-21 DIAGNOSIS — J90 Pleural effusion, not elsewhere classified: Secondary | ICD-10-CM | POA: Diagnosis present

## 2011-11-21 HISTORY — PX: VIDEO ASSISTED THORACOSCOPY (VATS)/DECORTICATION: SHX6171

## 2011-11-21 LAB — GLUCOSE, CAPILLARY: Glucose-Capillary: 132 mg/dL — ABNORMAL HIGH (ref 70–99)

## 2011-11-21 LAB — CBC
HCT: 38.5 % — ABNORMAL LOW (ref 39.0–52.0)
MCHC: 31.7 g/dL (ref 30.0–36.0)
Platelets: 285 10*3/uL (ref 150–400)
RDW: 14.9 % (ref 11.5–15.5)
WBC: 14.3 10*3/uL — ABNORMAL HIGH (ref 4.0–10.5)

## 2011-11-21 LAB — BASIC METABOLIC PANEL
BUN: 17 mg/dL (ref 6–23)
Chloride: 101 mEq/L (ref 96–112)
GFR calc Af Amer: >90 mL/min (ref >90)
GFR calc non Af Amer: >90 mL/min (ref >90)
Potassium: 3.5 mEq/L (ref 3.5–5.1)

## 2011-11-21 LAB — BLOOD GAS, ARTERIAL
Acid-Base Excess: 0.3 mmol/L (ref 0.0–2.0)
Bicarbonate: 25.4 mEq/L — ABNORMAL HIGH (ref 20.0–24.0)
O2 Content: 4 L/min
O2 Saturation: 98 %
Patient temperature: 98.6
TCO2: 26.9 mmol/L (ref 0–100)
pCO2 arterial: 48.2 mmHg — ABNORMAL HIGH (ref 35.0–45.0)
pH, Arterial: 7.342 — ABNORMAL LOW (ref 7.350–7.450)
pO2, Arterial: 116 mmHg — ABNORMAL HIGH (ref 80.0–100.0)

## 2011-11-21 LAB — PULMONARY FUNCTION TEST

## 2011-11-21 LAB — TRIGLYCERIDES, BODY FLUIDS: Triglycerides, Fluid: 115 mg/dL

## 2011-11-21 SURGERY — VIDEO ASSISTED THORACOSCOPY (VATS)/DECORTICATION
Anesthesia: General | Site: Chest | Laterality: Right | Wound class: Clean Contaminated

## 2011-11-21 MED ORDER — HYDROCORTISONE SOD SUCCINATE 100 MG IJ SOLR
INTRAMUSCULAR | Status: DC | PRN
  Administered 2011-11-21: 100 mg via INTRAVENOUS

## 2011-11-21 MED ORDER — ALBUTEROL SULFATE (5 MG/ML) 0.5% IN NEBU
2.5000 mg | INHALATION_SOLUTION | Freq: Once | RESPIRATORY_TRACT | Status: AC
  Administered 2011-11-21: 2.5 mg via RESPIRATORY_TRACT

## 2011-11-21 MED ORDER — BUPIVACAINE 0.5 % ON-Q PUMP SINGLE CATH 400 ML
400.0000 mL | INJECTION | Status: DC
  Filled 2011-11-21: qty 400

## 2011-11-21 MED ORDER — ACETAMINOPHEN 10 MG/ML IV SOLN
INTRAVENOUS | Status: AC
  Filled 2011-11-21: qty 100

## 2011-11-21 MED ORDER — DEXTROSE-NACL 5-0.45 % IV SOLN
INTRAVENOUS | Status: DC
  Administered 2011-11-21: 08:00:00 via INTRAVENOUS

## 2011-11-21 MED ORDER — PROPOFOL 10 MG/ML IV EMUL
INTRAVENOUS | Status: DC | PRN
  Administered 2011-11-21: 175 mg via INTRAVENOUS
  Administered 2011-11-21: 100 mg via INTRAVENOUS

## 2011-11-21 MED ORDER — INSULIN ASPART 100 UNIT/ML ~~LOC~~ SOLN
0.0000 [IU] | SUBCUTANEOUS | Status: DC
  Administered 2011-11-21 – 2011-11-22 (×3): 2 [IU] via SUBCUTANEOUS
  Administered 2011-11-22: 4 [IU] via SUBCUTANEOUS
  Administered 2011-11-23 – 2011-11-24 (×3): 2 [IU] via SUBCUTANEOUS
  Administered 2011-11-24: 4 [IU] via SUBCUTANEOUS

## 2011-11-21 MED ORDER — 0.9 % SODIUM CHLORIDE (POUR BTL) OPTIME
TOPICAL | Status: DC | PRN
  Administered 2011-11-21: 1000 mL

## 2011-11-21 MED ORDER — ONDANSETRON HCL 4 MG/2ML IJ SOLN
4.0000 mg | Freq: Four times a day (QID) | INTRAMUSCULAR | Status: DC | PRN
Start: 2011-11-21 — End: 2011-11-24

## 2011-11-21 MED ORDER — ALBUTEROL SULFATE (5 MG/ML) 0.5% IN NEBU
2.5000 mg | INHALATION_SOLUTION | RESPIRATORY_TRACT | Status: DC
  Administered 2011-11-21 – 2011-11-23 (×9): 2.5 mg via RESPIRATORY_TRACT
  Filled 2011-11-21 (×8): qty 0.5

## 2011-11-21 MED ORDER — POTASSIUM CHLORIDE 10 MEQ/50ML IV SOLN
10.0000 meq | Freq: Every day | INTRAVENOUS | Status: DC | PRN
  Filled 2011-11-21: qty 50

## 2011-11-21 MED ORDER — MIDAZOLAM HCL 5 MG/5ML IJ SOLN
INTRAMUSCULAR | Status: DC | PRN
  Administered 2011-11-21 (×2): 2 mg via INTRAVENOUS

## 2011-11-21 MED ORDER — SENNOSIDES-DOCUSATE SODIUM 8.6-50 MG PO TABS
1.0000 | ORAL_TABLET | Freq: Every evening | ORAL | Status: DC | PRN
  Filled 2011-11-21: qty 1

## 2011-11-21 MED ORDER — HYDROMORPHONE HCL PF 1 MG/ML IJ SOLN: 1.0000 mg | INTRAMUSCULAR | Status: DC | PRN

## 2011-11-21 MED ORDER — ACETAMINOPHEN 10 MG/ML IV SOLN
1000.0000 mg | Freq: Four times a day (QID) | INTRAVENOUS | Status: AC
  Administered 2011-11-21 – 2011-11-22 (×4): 1000 mg via INTRAVENOUS
  Filled 2011-11-21 (×4): qty 100

## 2011-11-21 MED ORDER — DIPHENHYDRAMINE HCL 50 MG/ML IJ SOLN: 12.5000 mg | Freq: Four times a day (QID) | INTRAMUSCULAR | Status: DC | PRN

## 2011-11-21 MED ORDER — LACTATED RINGERS IV SOLN
INTRAVENOUS | Status: DC | PRN
  Administered 2011-11-21: 12:00:00 via INTRAVENOUS

## 2011-11-21 MED ORDER — MIDAZOLAM HCL 2 MG/2ML IJ SOLN: 1.0000 mg | INTRAMUSCULAR | Status: DC | PRN

## 2011-11-21 MED ORDER — BUPIVACAINE ON-Q PAIN PUMP (FOR ORDER SET NO CHG)
INJECTION | Status: AC
  Filled 2011-11-21: qty 1

## 2011-11-21 MED ORDER — NEOSTIGMINE METHYLSULFATE 1 MG/ML IJ SOLN
INTRAMUSCULAR | Status: DC | PRN
  Administered 2011-11-21: 2 mg via INTRAVENOUS

## 2011-11-21 MED ORDER — FENTANYL CITRATE 0.05 MG/ML IJ SOLN
INTRAMUSCULAR | Status: DC | PRN
  Administered 2011-11-21: 100 ug via INTRAVENOUS
  Administered 2011-11-21: 150 ug via INTRAVENOUS
  Administered 2011-11-21 (×3): 50 ug via INTRAVENOUS
  Administered 2011-11-21: 100 ug via INTRAVENOUS

## 2011-11-21 MED ORDER — LACTATED RINGERS IV SOLN
INTRAVENOUS | Status: DC | PRN
  Administered 2011-11-21: 11:00:00 via INTRAVENOUS

## 2011-11-21 MED ORDER — DIPHENHYDRAMINE HCL 12.5 MG/5ML PO ELIX
12.5000 mg | ORAL_SOLUTION | Freq: Four times a day (QID) | ORAL | Status: DC | PRN
  Filled 2011-11-21: qty 5

## 2011-11-21 MED ORDER — GLYCOPYRROLATE 0.2 MG/ML IJ SOLN
INTRAMUSCULAR | Status: DC | PRN
  Administered 2011-11-21: 0.4 mg via INTRAVENOUS

## 2011-11-21 MED ORDER — PHENYLEPHRINE HCL 10 MG/ML IJ SOLN
10.0000 mg | INTRAVENOUS | Status: DC | PRN
  Administered 2011-11-21: 25 ug/min via INTRAVENOUS

## 2011-11-21 MED ORDER — BISACODYL 5 MG PO TBEC
10.0000 mg | DELAYED_RELEASE_TABLET | Freq: Every day | ORAL | Status: DC
  Administered 2011-11-22 – 2011-11-24 (×3): 10 mg via ORAL
  Filled 2011-11-21 (×3): qty 2

## 2011-11-21 MED ORDER — FENTANYL CITRATE 0.05 MG/ML IJ SOLN: 25.0000 ug | INTRAMUSCULAR | Status: DC | PRN

## 2011-11-21 MED ORDER — HYDROMORPHONE HCL PF 1 MG/ML IJ SOLN
0.2500 mg | INTRAMUSCULAR | Status: DC | PRN
  Administered 2011-11-21 (×6): 0.5 mg via INTRAVENOUS

## 2011-11-21 MED ORDER — ACETAMINOPHEN 10 MG/ML IV SOLN
INTRAVENOUS | Status: DC | PRN
  Administered 2011-11-21: 1000 mg via INTRAVENOUS

## 2011-11-21 MED ORDER — LORAZEPAM 2 MG/ML IJ SOLN: 1.0000 mg | Freq: Once | INTRAMUSCULAR | Status: DC | PRN

## 2011-11-21 MED ORDER — FENTANYL 10 MCG/ML IV SOLN
INTRAVENOUS | Status: DC
  Administered 2011-11-21: 285 ug via INTRAVENOUS
  Administered 2011-11-21: 20:00:00 via INTRAVENOUS
  Administered 2011-11-22: 168.3 ug via INTRAVENOUS
  Administered 2011-11-22 (×2): via INTRAVENOUS
  Administered 2011-11-22: 90 ug via INTRAVENOUS
  Administered 2011-11-22: 182 ug via INTRAVENOUS
  Administered 2011-11-22: 45 ug via INTRAVENOUS
  Administered 2011-11-22: 160.7 ug via INTRAVENOUS
  Administered 2011-11-23: 14:00:00 via INTRAVENOUS
  Administered 2011-11-23: 45 ug via INTRAVENOUS
  Administered 2011-11-23: 05:00:00 via INTRAVENOUS
  Administered 2011-11-23: 225 ug via INTRAVENOUS
  Administered 2011-11-23: 105 ug via INTRAVENOUS
  Administered 2011-11-23: 228 ug via INTRAVENOUS
  Administered 2011-11-23: 180 ug via INTRAVENOUS
  Administered 2011-11-23: 72 ug via INTRAVENOUS
  Administered 2011-11-24: 165 ug via INTRAVENOUS
  Administered 2011-11-24: 150 ug via INTRAVENOUS
  Administered 2011-11-24: 195 ug via INTRAVENOUS
  Filled 2011-11-21 (×6): qty 50

## 2011-11-21 MED ORDER — ONDANSETRON HCL 4 MG/2ML IJ SOLN
4.0000 mg | Freq: Four times a day (QID) | INTRAMUSCULAR | Status: DC | PRN
  Administered 2011-11-21 – 2011-11-23 (×4): 4 mg via INTRAVENOUS
  Filled 2011-11-21 (×4): qty 2

## 2011-11-21 MED ORDER — FENTANYL CITRATE 0.05 MG/ML IJ SOLN: 50.0000 ug | INTRAMUSCULAR | Status: DC | PRN

## 2011-11-21 MED ORDER — OXYCODONE HCL 5 MG PO TABS
5.0000 mg | ORAL_TABLET | ORAL | Status: AC | PRN
  Administered 2011-11-21 – 2011-11-22 (×5): 10 mg via ORAL
  Filled 2011-11-21 (×5): qty 2

## 2011-11-21 MED ORDER — HEMOSTATIC AGENTS (NO CHARGE) OPTIME
TOPICAL | Status: DC | PRN
  Administered 2011-11-21: 2 via TOPICAL

## 2011-11-21 MED ORDER — SODIUM CHLORIDE 0.9 % IJ SOLN: 9.0000 mL | INTRAMUSCULAR | Status: DC | PRN

## 2011-11-21 MED ORDER — NALOXONE HCL 0.4 MG/ML IJ SOLN: 0.4000 mg | INTRAMUSCULAR | Status: DC | PRN

## 2011-11-21 MED ORDER — LABETALOL HCL 5 MG/ML IV SOLN
INTRAVENOUS | Status: DC | PRN
  Administered 2011-11-21 (×2): 5 mg via INTRAVENOUS
  Administered 2011-11-21: 10 mg via INTRAVENOUS

## 2011-11-21 MED ORDER — ROCURONIUM BROMIDE 100 MG/10ML IV SOLN
INTRAVENOUS | Status: DC | PRN
  Administered 2011-11-21: 65 mg via INTRAVENOUS
  Administered 2011-11-21: 25 mg via INTRAVENOUS

## 2011-11-21 MED ORDER — SODIUM CHLORIDE 0.9 % IV SOLN
INTRAVENOUS | Status: DC | PRN
  Administered 2011-11-21: 12:00:00 via INTRAVENOUS

## 2011-11-21 MED ORDER — ONDANSETRON HCL 4 MG/2ML IJ SOLN
INTRAMUSCULAR | Status: DC | PRN
  Administered 2011-11-21: 4 mg via INTRAVENOUS

## 2011-11-21 MED ORDER — KCL IN DEXTROSE-NACL 10-5-0.45 MEQ/L-%-% IV SOLN
INTRAVENOUS | Status: DC
  Administered 2011-11-21 – 2011-11-22 (×2): via INTRAVENOUS
  Filled 2011-11-21 (×4): qty 1000

## 2011-11-21 MED ORDER — LIDOCAINE HCL (CARDIAC) 20 MG/ML IV SOLN
INTRAVENOUS | Status: DC | PRN
  Administered 2011-11-21: 100 mg via INTRAVENOUS

## 2011-11-21 SURGICAL SUPPLY — 68 items
APL SRG 22X2 LUM MLBL SLNT (VASCULAR PRODUCTS)
APL SRG 7X2 LUM MLBL SLNT (VASCULAR PRODUCTS)
APPLICATOR TIP COSEAL (VASCULAR PRODUCTS) IMPLANT
APPLICATOR TIP EXT COSEAL (VASCULAR PRODUCTS) IMPLANT
APPLIER CLIP 9.375 SM OPEN (CLIP) ×3
APR CLP SM 9.3 20 MLT OPN (CLIP) ×1
CANISTER SUCTION 2500CC (MISCELLANEOUS) ×3 IMPLANT
CATH KIT ON Q 5IN SLV (PAIN MANAGEMENT) ×2 IMPLANT
CATH ROBINSON RED A/P 22FR (CATHETERS) ×3 IMPLANT
CATH THORACIC 28FR (CATHETERS) IMPLANT
CATH THORACIC 28FR RT ANG (CATHETERS) ×2 IMPLANT
CATH THORACIC 36FR (CATHETERS) IMPLANT
CATH THORACIC 36FR RT ANG (CATHETERS) IMPLANT
CLIP APPLIE 9.375 SM OPEN (CLIP) IMPLANT
CLIP TI MEDIUM 24 (CLIP) IMPLANT
CLOTH BEACON ORANGE TIMEOUT ST (SAFETY) ×3 IMPLANT
CONT SPEC 4OZ CLIKSEAL STRL BL (MISCELLANEOUS) ×6 IMPLANT
COVER SURGICAL LIGHT HANDLE (MISCELLANEOUS) ×6 IMPLANT
DRAIN CHANNEL 32F RND 10.7 FF (WOUND CARE) ×2 IMPLANT
DRAPE LAPAROSCOPIC ABDOMINAL (DRAPES) ×3 IMPLANT
DRAPE SLUSH MACHINE 52X66 (DRAPES) IMPLANT
DRAPE SLUSH/WARMER DISC (DRAPES) ×2 IMPLANT
ELECT BLADE 4.0 EZ CLEAN MEGAD (MISCELLANEOUS) ×3
ELECT REM PT RETURN 9FT ADLT (ELECTROSURGICAL) ×3
ELECTRODE BLDE 4.0 EZ CLN MEGD (MISCELLANEOUS) IMPLANT
ELECTRODE REM PT RTRN 9FT ADLT (ELECTROSURGICAL) ×1 IMPLANT
GLOVE BIO SURGEON STRL SZ7.5 (GLOVE) ×6 IMPLANT
GOWN PREVENTION PLUS XLARGE (GOWN DISPOSABLE) ×3 IMPLANT
GOWN STRL NON-REIN LRG LVL3 (GOWN DISPOSABLE) ×12 IMPLANT
KIT BASIN OR (CUSTOM PROCEDURE TRAY) ×3 IMPLANT
KIT ROOM TURNOVER OR (KITS) ×3 IMPLANT
KIT SUCTION CATH 14FR (SUCTIONS) ×3 IMPLANT
NS IRRIG 1000ML POUR BTL (IV SOLUTION) ×6 IMPLANT
PACK CHEST (CUSTOM PROCEDURE TRAY) ×3 IMPLANT
PAD ARMBOARD 7.5X6 YLW CONV (MISCELLANEOUS) ×6 IMPLANT
SEALANT PROGEL (MISCELLANEOUS) IMPLANT
SEALANT SURG COSEAL 4ML (VASCULAR PRODUCTS) IMPLANT
SEALANT SURG COSEAL 8ML (VASCULAR PRODUCTS) IMPLANT
SOLUTION ANTI FOG 6CC (MISCELLANEOUS) ×3 IMPLANT
SPONGE GAUZE 4X4 12PLY (GAUZE/BANDAGES/DRESSINGS) ×3 IMPLANT
SPONGE TONSIL 1.25 RF SGL STRG (GAUZE/BANDAGES/DRESSINGS) ×6 IMPLANT
SUT CHROMIC 3 0 SH 27 (SUTURE) ×4 IMPLANT
SUT PROLENE 3 0 SH DA (SUTURE) IMPLANT
SUT PROLENE 4 0 RB 1 (SUTURE)
SUT PROLENE 4-0 RB1 .5 CRCL 36 (SUTURE) IMPLANT
SUT SILK  1 MH (SUTURE) ×4
SUT SILK 1 MH (SUTURE) ×2 IMPLANT
SUT SILK 2 0SH CR/8 30 (SUTURE) ×2 IMPLANT
SUT SILK 3 0SH CR/8 30 (SUTURE) IMPLANT
SUT VIC AB 1 CTX 18 (SUTURE) ×2 IMPLANT
SUT VIC AB 1 CTX 36 (SUTURE)
SUT VIC AB 1 CTX36XBRD ANBCTR (SUTURE) IMPLANT
SUT VIC AB 2-0 CTX 36 (SUTURE) ×2 IMPLANT
SUT VIC AB 2-0 UR6 27 (SUTURE) IMPLANT
SUT VIC AB 3-0 X1 27 (SUTURE) ×2 IMPLANT
SUT VICRYL 2 TP 1 (SUTURE) ×2 IMPLANT
SWAB COLLECTION DEVICE MRSA (MISCELLANEOUS) ×2 IMPLANT
SYR BULB IRRIGATION 50ML (SYRINGE) ×3 IMPLANT
SYSTEM SAHARA CHEST DRAIN ATS (WOUND CARE) ×3 IMPLANT
TAPE CLOTH 4X10 WHT NS (GAUZE/BANDAGES/DRESSINGS) ×3 IMPLANT
TIP APPLICATOR SPRAY EXTEND 16 (VASCULAR PRODUCTS) IMPLANT
TOWEL OR 17X24 6PK STRL BLUE (TOWEL DISPOSABLE) ×3 IMPLANT
TOWEL OR 17X26 10 PK STRL BLUE (TOWEL DISPOSABLE) ×6 IMPLANT
TRAP SPECIMEN MUCOUS 40CC (MISCELLANEOUS) ×4 IMPLANT
TRAY FOLEY CATH 14FRSI W/METER (CATHETERS) ×3 IMPLANT
TUBE ANAEROBIC SPECIMEN COL (MISCELLANEOUS) ×2 IMPLANT
TUNNELER SHEATH ON-Q 11GX8 (MISCELLANEOUS) IMPLANT
WATER STERILE IRR 1000ML POUR (IV SOLUTION) ×6 IMPLANT

## 2011-11-21 NOTE — Op Note (Signed)
[name], [name] NO.:  [name]  MEDICAL RECORD NO.:  [id number]  LOCATION:  2307                         FACILITY:  [name]  PHYSICIAN:  [name] [name], M.D.  DATE OF BIRTH:  1961/01/18  DATE OF PROCEDURE:  11/21/2011 DATE OF DISCHARGE:                              OPERATIVE REPORT   OPERATION: 1. Right VATS, decortication of right lower lobe and drainage of     empyema. 2. Placement of wound On-Q analgesia irrigation system.  PREOPERATIVE DIAGNOSIS:  Right lower lobe pneumonia with right empyema.  POSTOP DIAGNOSIS:  Right lower lobe pneumonia with right empyema.  SURGEON:  [name] [name], M.D.  ASSISTANT:  [name] [name], RNFA.  ANESTHESIA:  General.  INDICATIONS:  The patient is a 51 year old steroid dependent patient with lupus and history of smoking who was previously hospitalized with a right lower lobe pneumonia and discharged home.  He returned with improved cough and improved fever but with significant pleuritic pain. A followup x-ray and CT scan showed a large loculated effusion which was tapped with a thoracentesis of over 500 mL of cloudy fluid.  A thoracic surgical evaluation was requested.  Based on the patient's chronic immunosuppressive state and the empyema, I recommended a drainage procedure and decortication as the post thoracentesis chest x-ray showed reaccumulation of fluid.  Prior to surgery, I reviewed the operation with the patient including indications, benefits, alternatives, and expected recovery.  I also discussed the risks to him of bleeding, prolonged air leak, recurrent infection, pneumonia, and death.  He understood and agreed to proceed with surgery.  PROCEDURE:  The patient was brought to the operating room, placed supine on the operative table where general anesthesia was induced.  A proper time-out was performed.  The right chest was turned up and the right chest was prepped and draped as a sterile field.  A small  VATS portal incision was made at the tip of the scapula and the scope was inserted in the pleural space.  The pleural space was obliterated with adhesions, loculated thick fluid, and visualization was poor.  The scope was removed.  The incision was extended.  The fifth interspace was gently opened with a rib spreader.  Gradually the pleural adhesions, loculated fluid pockets were all opened up and drained and the pleural peel over the right lower lobe was dissected and removed.  There was no identifiable discrete pulmonary mass noted.  The fluid was sent for cytology and cultures.  The peel was sent for cultures and pathology. The pleural space was irrigated with warm saline.  Some medical topical adhesive was placed over the raw lung edges where the lower lobe was decorticated (ProGEL).  Two tubes were placed to drain the anterior and posterior pleural space and brought out through separate incisions.  The lung was re-expanded.  It should be noted that visualization was not optimal due to incomplete atelectasis of the right lung with double- lumen endotracheal tube technique.  The ribs were reapproximated with #2 Vicryl.  The muscle layer was closed with interrupted #1 Vicryl. The skin was closed with running subcuticular Vicryl.  An On-Q catheter was placed beneath the chest tubes and  the main incision and secured to the skin and connected to a reservoir of 0.5% Marcaine.  The patient was then turned to his back and extubated and returned to recovery room.     [name] [name], M.D.     PV/MEDQ  D:  11/21/2011  T:  11/21/2011  Job:  [id number]  cc:   [name] [name], M.D.

## 2011-11-21 NOTE — Progress Notes (Signed)
Patient ID: [name] [name], male   DOB: 08-14-1960, 51 y.o.   MRN: [id number]   Filed Vitals:   11/21/11 1730 11/21/11 1745 11/21/11 1800 11/21/11 1820  BP: 120/84 129/84  133/85  Pulse: 66 70  67  Temp:      TempSrc:      Resp: 22 20 20 20   Height:      Weight:      SpO2: 98% 98% 98% 98%    Awake and alert Urine output  Good CT output low.  A/P:  stable

## 2011-11-21 NOTE — Anesthesia Postprocedure Evaluation (Signed)
Anesthesia Post Note  Patient: [name] [name]  Procedure(s) Performed: Procedure(s) (LRB): VIDEO ASSISTED THORACOSCOPY (VATS)/DECORTICATION (Right)  Anesthesia type: general  Patient location: PACU  Post pain: Pain level controlled  Post assessment: Patient's Cardiovascular Status Stable  Last Vitals:  Filed Vitals:   11/21/11 1730  BP: 120/84  Pulse: 66  Temp:   Resp: 22    Post vital signs: Reviewed and stable  Level of consciousness: sedated  Complications: No apparent anesthesia complications

## 2011-11-21 NOTE — Transfer of Care (Signed)
Immediate Anesthesia Transfer of Care Note  Patient: [name] [name]  Procedure(s) Performed: Procedure(s) (LRB): VIDEO ASSISTED THORACOSCOPY (VATS)/DECORTICATION (Right)  Patient Location: PACU  Anesthesia Type: General  Level of Consciousness: awake  Airway & Oxygen Therapy: Patient Spontanous Breathing and Patient connected to nasal cannula oxygen  Post-op Assessment: Report given to PACU RN and Post -op Vital signs reviewed and stable  Post vital signs: Reviewed and stable  Complications: No apparent anesthesia complications

## 2011-11-21 NOTE — Progress Notes (Signed)
Medical Student Daily Progress Note  Subjective: Patient is post-op from his procedure. He is a little drowsy still but overall seems to be doing well. According to staff present in the OR during procedure there seemed to be no major complications. He is recovering in PACU and will be moved to his room when bed assignment available.  Objective: Vital signs in last 24 hours: Filed Vitals:   11/21/11 0500 11/21/11 1538 11/21/11 1545 11/21/11 1600  BP: 132/90 155/87 129/86 134/84  Pulse: 82 67 66 68  Temp: 97.6 F (36.4 C) 95.4 F (35.2 C)    TempSrc: Oral     Resp: 20 23 25 27   Height:      Weight:      SpO2: 94% 97% 97% 97%   Weight change:   Intake/Output Summary (Last 24 hours) at 11/21/11 1608 Last data filed at 11/21/11 1538  Gross per 24 hour  Intake   1050 ml  Output    575 ml  Net    475 ml   Physical Exam: BP 134/84  Pulse 68  Temp(Src) 95.4 F (35.2 C) (Oral)  Resp 27  Ht 5\' 7"  (1.702 m)  Wt 77.656 kg (171 lb 3.2 oz)  BMI 26.81 kg/m2  SpO2 97% General appearance: moderately sedated, no distress, no pain Head: Normocephalic, without obvious abnormality, atraumatic Neck: no adenopathy, no carotid bruit, no JVD, supple, symmetrical, trachea midline and thyroid not enlarged, symmetric, no tenderness/mass/nodules Back: patient supine, unable to assess Lungs: patient supine, unable to assess Chest wall: 2 right chest tubes draining chest cavity Heart: regular rate and rhythm, S1, S2 normal, no murmur, click, rub or gallop Abdomen: soft, non-tender; bowel sounds normal; no masses,  no organomegaly Extremities: extremities normal, atraumatic, no cyanosis or edema Skin: Skin color, texture, turgor normal. No rashes or lesions Lab Results: Basic Metabolic Panel:  Lab 11/21/11 2130 11/20/11 0535  NA 139 138  K 3.5 4.0  CL 101 99  CO2 27 26  GLUCOSE 108* 107*  BUN 17 18  CREATININE 0.88 0.72  CALCIUM 9.5 9.9  MG -- --  PHOS -- --   Liver Function  Tests:  Lab 11/20/11 0535 11/19/11 1413  AST 37 36  ALT 79* 76*  ALKPHOS 139* 141*  BILITOT 0.2* 0.3  PROT 7.5 7.3  ALBUMIN 2.7* 2.7*   No results found for this basename: LIPASE:2,AMYLASE:2 in the last 168 hours No results found for this basename: AMMONIA:2 in the last 168 hours CBC:  Lab 11/21/11 0512 11/20/11 2001 11/19/11 1150 11/18/11 1817  WBC 14.3* 16.0* -- --  NEUTROABS -- -- 15.2* 11.5*  HGB 12.2* 12.1* -- --  HCT 38.5* 37.3* -- --  MCV 83.7 83.1 -- --  PLT 285 299 -- --   Cardiac Enzymes:  Lab 11/19/11 2329 11/19/11 1804 11/19/11 1151  CKTOTAL 12 15 13   CKMB 0.9 0.9 0.8  CKMBINDEX -- -- --  TROPONINI <0.30 <0.30 <0.30   BNP: No results found for this basename: PROBNP:3 in the last 168 hours D-Dimer:  Lab 11/18/11 1540  DDIMER 2.62*   CBG: No results found for this basename: GLUCAP:6 in the last 168 hours Hemoglobin A1C:  Lab 11/19/11 1413  HGBA1C 5.8*   Fasting Lipid Panel: No results found for this basename: CHOL,HDL,LDLCALC,TRIG,CHOLHDL,LDLDIRECT in the last 865 hours Thyroid Function Tests:  Lab 11/20/11 0535  TSH 0.782  T4TOTAL --  FREET4 --  T3FREE --  THYROIDAB --   Coagulation:  Lab 11/19/11 1413 11/18/11  1540  LABPROT 12.6 12.5  INR 0.92 0.91   Anemia Panel: No results found for this basename: VITAMINB12,FOLATE,FERRITIN,TIBC,IRON,RETICCTPCT in the last 168 hours Urine Drug Screen: Drugs of Abuse     Component Value Date/Time   LABOPIA NEG 08/30/2010 1653   LABOPIA POS* 02/22/2010 0000   COCAINSCRNUR NEG 08/30/2010 1653   COCAINSCRNUR NONE DETECTED 07/24/2009 1648   LABBENZ POS* 08/30/2010 1653   LABBENZ POSITIVE* 07/24/2009 1648   AMPHETMU NEG 08/30/2010 1653   AMPHETMU NONE DETECTED 07/24/2009 1648   THCU NONE DETECTED 07/24/2009 1648   LABBARB  Value: NONE DETECTED        DRUG SCREEN FOR MEDICAL PURPOSES ONLY.  IF CONFIRMATION IS NEEDED FOR ANY PURPOSE, NOTIFY LAB WITHIN 5 DAYS.        LOWEST DETECTABLE LIMITS FOR URINE DRUG SCREEN  Drug Class       Cutoff (ng/mL) Amphetamine      1000 Barbiturate      200 Benzodiazepine   200 Tricyclics       300 Opiates          300 Cocaine          300 THC              50 07/24/2009 1648    Alcohol Level: No results found for this basename: ETH:2 in the last 168 hours Urinalysis:  Lab 11/19/11 1740 11/18/11 1524  COLORURINE YELLOW YELLOW  LABSPEC 1.013 1.017  PHURINE 7.0 6.0  GLUCOSEU NEGATIVE NEGATIVE  HGBUR NEGATIVE NEGATIVE  BILIRUBINUR NEGATIVE NEGATIVE  KETONESUR NEGATIVE NEGATIVE  PROTEINUR NEGATIVE NEGATIVE  UROBILINOGEN 0.2 0.2  NITRITE NEGATIVE NEGATIVE  LEUKOCYTESUR NEGATIVE NEGATIVE    Micro Results: Recent Results (from the past 240 hour(s))  AFB CULTURE WITH SMEAR     Status: Normal (Preliminary result)   Collection Time   11/18/11  9:48 AM      Component Value Range Status Comment   Specimen Description PLEURAL   Final    Special Requests NONE   Final    ACID FAST SMEAR NO ACID FAST BACILLI SEEN   Final    Culture     Final    Value: CULTURE WILL BE EXAMINED FOR 6 WEEKS BEFORE ISSUING A FINAL REPORT   Report Status PENDING   Incomplete   ANAEROBIC CULTURE     Status: Normal (Preliminary result)   Collection Time   11/19/11  9:44 AM      Component Value Range Status Comment   Specimen Description PLEURAL RIGHT FLUID   Final    Special Requests NONE   Final    Gram Stain     Final    Value: RARE WBC PRESENT,BOTH PMN AND MONONUCLEAR     NO ORGANISMS SEEN   Culture PENDING   Incomplete    Report Status PENDING   Incomplete   BODY FLUID CULTURE     Status: Normal (Preliminary result)   Collection Time   11/19/11  9:48 AM      Component Value Range Status Comment   Specimen Description PLEURAL RIGHT FLUID   Final    Special Requests NONE   Final    Gram Stain     Final    Value: RARE WBC PRESENT,BOTH PMN AND MONONUCLEAR     NO ORGANISMS SEEN   Culture NO GROWTH 2 DAYS   Final    Report Status PENDING   Incomplete   FUNGAL STAIN     Status: Normal  Collection Time   11/19/11  9:53 AM      Component Value Range Status Comment   Specimen Description PLEURAL RIGHT FLUID   Final    Special Requests NONE   Final    Fungal Smear NO YEAST OR FUNGAL ELEMENTS SEEN   Final    Report Status 11/20/2011 FINAL   Final   SURGICAL PCR SCREEN     Status: Normal   Collection Time   11/19/11  2:16 PM      Component Value Range Status Comment   MRSA, PCR NEGATIVE  NEGATIVE  Final    Staphylococcus aureus NEGATIVE  NEGATIVE  Final    Studies/Results: Dg Chest 2 View  11/20/2011  *RADIOLOGY REPORT*  Clinical Data: Preop.  Empyema.  CHEST - 2 VIEW  Comparison: Yesterday  Findings: Loculated right pleural effusion is improved.  Linear atelectasis at both lung bases.  Upper lungs clear.  Mild cardiomegaly.  Normal vascularity.  IMPRESSION: Improved loculated right pleural effusion.  Bibasilar atelectasis.  Original Report Authenticated By: Donavan Burnet, M.D.   Dg Chest Port 1 View  11/21/2011  *RADIOLOGY REPORT*  Clinical Data: Barely.  Right thoracotomy.  PORTABLE CHEST - 1 VIEW  Comparison: 11/21/2011  Findings: Two right chest tubes are in place.  Small right apical pneumothorax.  Right lower lobe atelectasis or infiltrate.  Right central line is in place with the tip in the SVC.  Endotracheal tube in place with extension into the left main stem bronchus. Mild cardiomegaly.  IMPRESSION: Support devices as above.  Tiny right apical pneumothorax.  Right lower lobe atelectasis or infiltrate.  Original Report Authenticated By: Cyndie Chime, M.D.   Dg Chest Port 1 View  11/21/2011  *RADIOLOGY REPORT*  Clinical Data: Preoperative respiratory exam for thoracotomy and drainage of loculated right pleural fluid collection.  PORTABLE CHEST - 1 VIEW  Comparison: 11/20/2011  Findings: Increased airspace opacity at the right lung base. Otherwise fairly stable appearance to a laterally loculated pleural fluid collection.  No edema.  Stable heart size.  IMPRESSION:  Increased airspace disease at the right lung base.  Original Report Authenticated By: Reola Calkins, M.D.   Medications:  I have reviewed the patient's current medications. Prior to Admission:  Prescriptions prior to admission  Medication Sig Dispense Refill  . acetaminophen (TYLENOL) 500 MG tablet Take 1 tablet (500 mg total) by mouth every 6 (six) hours as needed for pain or fever (or Fever >/= 101).  60 tablet  0  . guaiFENesin (MUCINEX) 600 MG 12 hr tablet Take 1,200 mg by mouth 2 (two) times daily.      . Multiple Vitamin (MULITIVITAMIN WITH MINERALS) TABS Take 1 tablet by mouth daily.      . nebivolol (BYSTOLIC) 5 MG tablet Take 1 tablet (5 mg total) by mouth daily.  30 tablet  1  . predniSONE (DELTASONE) 10 MG tablet Take 4 tablets daily for 4 days ( total 40 mg ), then 3 tablets daily for next 4 days ( total 30 mg), then 2 tablets ( 20 mg ) daily for next 4 days and then 10 mg daily ( 1 tablet ) until you come back to clinic.  100 tablet  0  . HYDROcodone-acetaminophen (NORCO) 5-325 MG per tablet Take 1 tablet by mouth every 4 (four) hours as needed.  20 tablet  0   Anti-infectives     Start     Dose/Rate Route Frequency Ordered Stop   11/19/11 2000  piperacillin-tazobactam (  ZOSYN) IVPB 3.375 g       3.375 g 12.5 mL/hr over 240 Minutes Intravenous Every 8 hours 11/19/11 1256     11/19/11 1359   cefUROXime (ZINACEF) 1.5 g in dextrose 5 % 50 mL IVPB  Status:  Discontinued        1.5 g 100 mL/hr over 30 Minutes Intravenous 60 min pre-op 11/19/11 1359 11/19/11 1426   11/19/11 1200   vancomycin (VANCOCIN) IVPB 1000 mg/200 mL premix        1,000 mg 200 mL/hr over 60 Minutes Intravenous Every 8 hours 11/19/11 1052     11/19/11 1100  piperacillin-tazobactam (ZOSYN) IVPB 3.375 g       3.375 g 12.5 mL/hr over 240 Minutes Intravenous 4 times per day 11/19/11 1035 11/19/11 1521         Scheduled Meds:   . albuterol  2.5 mg Nebulization Once  . bupivacaine 0.5 % ON-Q pump SINGLE  CATH 400 mL  400 mL Other To OR  . docusate sodium  100 mg Oral BID  . hydrocortisone sod succinate (SOLU-CORTEF) injection  25 mg Intravenous Q8H  . hydrocortisone sod succinate (SOLU-CORTEF) injection  50 mg Intravenous Once  . mulitivitamin with minerals  1 tablet Oral Daily  . nebivolol  5 mg Oral Daily  . nicotine  21 mg Transdermal Daily  . piperacillin-tazobactam (ZOSYN)  IV  3.375 g Intravenous Q8H  . sodium chloride  3 mL Intravenous Q12H  . vancomycin  1,000 mg Intravenous Q8H  . DISCONTD: bupivacaine 0.5 % ON-Q pump SINGLE CATH 400 mL  400 mL Other To OR   Continuous Infusions:   . dextrose 5 % and 0.45% NaCl 100 mL/hr at 11/21/11 0749   PRN Meds:.sodium chloride, alum & mag hydroxide-simeth, fentaNYL, HYDROmorphone (DILAUDID) injection, ibuprofen, ketorolac, LORazepam, midazolam, morphine injection, ondansetron (ZOFRAN) IV, ondansetron, oxyCODONE, polyethylene glycol, sodium chloride, traZODone, DISCONTD: 0.9 % irrigation (POUR BTL), DISCONTD: hemostatic agents Assessment/Plan:  Loculated pleural effusion/empyema - Patient is post-op VATS with decortication. He has a small right apical pneumothorax and RLL atelectasis vs. Infiltrate.  - Appreciate TCTS consult in the management of this patient - continue Vanc (5/26>>) and Zosyn (5/26>>) - will switch to appropriate oral abx as patient begins tolerating PO/condition improves - dilaudid x 8 doses PRN, ibuprofen, ketorolac, morphine, oxy IR : PRN pain  RA - Stable. Well controlled on home regimen. Will give him stress dose of hydrocortisone beginning today for his procedure. - Hydrocortisone 50 mg x 1 @ 0800 (5/28) - Hydrocortisone 25mg  Q8H x 3 days (5/28) - resume prednisone after high dose regimen complete (5/31), and will taper back down to home dose  Hypertension - Well controlled on his home regimen. - continue nebivolol 5 mg daily  History of SLE - Patient has history of SLE, currently stable and no management needed.   - last known therapy about 4 years ago  DVT ppx - SCDs  Disposition - Patient is currently in PACU post-procedure. He will transfer to a new room when stable for continued observation and recovery.   LOS: 3 days   This is a Psychologist, occupational Note.  The care of the patient was discussed with Dr. Quentin Ore and the assessment and plan formulated with their assistance.  Please see their attached note for official documentation of the daily encounter.  [name] [name] 11/21/2011, 4:08 PM

## 2011-11-21 NOTE — Progress Notes (Signed)
INTERNAL MEDICINE DAILY PROGRESS NOTE  [name] [name] MRN: [id number] DOB/AGE: [birth year] 51 y.o.  Admit date: 11/18/2011  Subjective: [name] [name] states pain is well controlled. Slept very poorly. No fevers or other complaint. Surgery today at 11:30  Objective: Vital signs in last 24 hours: Filed Vitals:   11/20/11 0600 11/20/11 1400 11/20/11 2100 11/21/11 0500  BP: 116/89 124/82 127/85 132/90  Pulse: 82 75 71 82  Temp: 97.6 F (36.4 C) 98.1 F (36.7 C) 97.9 F (36.6 C) 97.6 F (36.4 C)  TempSrc:  Oral Oral Oral  Resp: 15 18 20 20   Height:      Weight:      SpO2: 94% 92% 95% 94%   Weight change:  No intake or output data in the 24 hours ending 11/21/11 0706 Physical Exam: General: resting in bed, NAD Cardiac: RRR, no rubs, murmurs or gallops  Pulm: Decreased expansion on the right and decreased breath sounds on the right as well. Inspiratory crackles at the base on the left.  Abd: soft, nontender, nondistended, BS present  Ext: warm and well perfused, no pedal edema, SCDs not on patient Neuro: alert and oriented X3, cranial nerves II-XII grossly intact  Lab Results: Basic Metabolic Panel:  Lab 11/21/11 4098 11/20/11 0535  NA 139 138  K 3.5 4.0  CL 101 99  CO2 27 26  GLUCOSE 108* 107*  BUN 17 18  CREATININE 0.88 0.72  CALCIUM 9.5 9.9  MG -- --  PHOS -- --   Liver Function Tests:  Lab 11/20/11 0535 11/19/11 1413  AST 37 36  ALT 79* 76*  ALKPHOS 139* 141*  BILITOT 0.2* 0.3  PROT 7.5 7.3  ALBUMIN 2.7* 2.7*   CBC:  Lab 11/21/11 0512 11/20/11 2001 11/19/11 1150 11/18/11 1817  WBC 14.3* 16.0* -- --  NEUTROABS -- -- 15.2* 11.5*  HGB 12.2* 12.1* -- --  HCT 38.5* 37.3* -- --  MCV 83.7 83.1 -- --  PLT 285 299 -- --   Cardiac Enzymes:  Lab 11/19/11 2329 11/19/11 1804 11/19/11 1151  CKTOTAL 12 15 13   CKMB 0.9 0.9 0.8  CKMBINDEX -- -- --  TROPONINI <0.30 <0.30 <0.30   Hemoglobin A1C:  Lab 11/19/11 1413  HGBA1C 5.8*   Fasting Lipid Panel: No results  found for this basename: CHOL,HDL,LDLCALC,TRIG,CHOLHDL,LDLDIRECT in the last 119 hours Thyroid Function Tests:  Lab 11/20/11 0535  TSH 0.782  T4TOTAL --  FREET4 --  T3FREE --  THYROIDAB --   Coagulation:  Lab 11/19/11 1413 11/18/11 1540  LABPROT 12.6 12.5  INR 0.92 0.91   Urinalysis:  Lab 11/19/11 1740 11/18/11 1524  COLORURINE YELLOW YELLOW  LABSPEC 1.013 1.017  PHURINE 7.0 6.0  GLUCOSEU NEGATIVE NEGATIVE  HGBUR NEGATIVE NEGATIVE  BILIRUBINUR NEGATIVE NEGATIVE  KETONESUR NEGATIVE NEGATIVE  PROTEINUR NEGATIVE NEGATIVE  UROBILINOGEN 0.2 0.2  NITRITE NEGATIVE NEGATIVE  LEUKOCYTESUR NEGATIVE NEGATIVE   Micro Results: Recent Results (from the past 240 hour(s))  AFB CULTURE WITH SMEAR     Status: Normal (Preliminary result)   Collection Time   11/18/11  9:48 AM      Component Value Range Status Comment   Specimen Description PLEURAL   Final    Special Requests NONE   Final    ACID FAST SMEAR NO ACID FAST BACILLI SEEN   Final    Culture     Final    Value: CULTURE WILL BE EXAMINED FOR 6 WEEKS BEFORE ISSUING A FINAL REPORT   Report Status PENDING  Incomplete   BODY FLUID CULTURE     Status: Normal (Preliminary result)   Collection Time   11/19/11  9:48 AM      Component Value Range Status Comment   Specimen Description PLEURAL RIGHT FLUID   Final    Special Requests NONE   Final    Gram Stain PENDING   Incomplete    Culture NO GROWTH 1 DAY   Final    Report Status PENDING   Incomplete   FUNGAL STAIN     Status: Normal   Collection Time   11/19/11  9:53 AM      Component Value Range Status Comment   Specimen Description PLEURAL RIGHT FLUID   Final    Special Requests NONE   Final    Fungal Smear NO YEAST OR FUNGAL ELEMENTS SEEN   Final    Report Status 11/20/2011 FINAL   Final   SURGICAL PCR SCREEN     Status: Normal   Collection Time   11/19/11  2:16 PM      Component Value Range Status Comment   MRSA, PCR NEGATIVE  NEGATIVE  Final    Staphylococcus aureus  NEGATIVE  NEGATIVE  Final    Studies/Results: Dg Chest 1 View  11/19/2011  *RADIOLOGY REPORT*  Clinical Data: Status post right thoracentesis  CHEST - 1 VIEW  Comparison: Chest radiograph 11/18/2011 and chest CT 11/18/2011  Findings: Likely a loculated right pleural effusion has decreased in size since chest radiograph of 11/18/2011. Right basilar atelectasis noted.  There is no visible pneumothorax, status post thoracentesis.  The left lung remains clear.  Stable cardiomegaly and mediastinal contours.  No acute bony abnormality.  IMPRESSION: 1.  Decrease in size of the probably loculated right pleural effusion, status post thoracentesis. 2.  Negative for pneumothorax.  Original Report Authenticated By: Britta Mccreedy, M.D.   Dg Chest 2 View  11/20/2011  *RADIOLOGY REPORT*  Clinical Data: Preop.  Empyema.  CHEST - 2 VIEW  Comparison: Yesterday  Findings: Loculated right pleural effusion is improved.  Linear atelectasis at both lung bases.  Upper lungs clear.  Mild cardiomegaly.  Normal vascularity.  IMPRESSION: Improved loculated right pleural effusion.  Bibasilar atelectasis.  Original Report Authenticated By: Donavan Burnet, M.D.   US Thoracentesis Asp Pleural Space W/img Guide  11/19/2011  *RADIOLOGY REPORT*  Clinical Data:  Pneumonia, right-sided pleural effusion  ULTRASOUND GUIDED right THORACENTESIS  Comparison:  None  An ultrasound guided thoracentesis was thoroughly discussed with the patient and questions answered.  The benefits, risks, alternatives and complications were also discussed.  The patient understands and wishes to proceed with the procedure.  Written consent was obtained.  Ultrasound was performed to localize and mark an adequate pocket of fluid in the right chest. The effusion was noted to be loculated. The area was then prepped and draped in the normal sterile fashion. 1% Lidocaine was used for local anesthesia.  Under ultrasound guidance a 19 gauge Yueh catheter was introduced.   Thoracentesis was performed.  The catheter was removed and a dressing applied.  Complications:  None  Findings: A total of approximately 430 ml of cloudy blood tinged fluid was removed. A fluid sample was sent for laboratory analysis.  IMPRESSION: Successful ultrasound guided right thoracentesis yielding 430 ml. of pleural fluid.  Read by Brayton El PA-C  Original Report Authenticated By: Richarda Overlie, M.D.   Medications: I have reviewed the patient's current medications. Scheduled Meds:   . docusate sodium  100 mg Oral  BID  . hydrocortisone sod succinate (SOLU-CORTEF) injection  25 mg Intravenous Q8H  . hydrocortisone sod succinate (SOLU-CORTEF) injection  50 mg Intravenous Once  . mulitivitamin with minerals  1 tablet Oral Daily  . nebivolol  5 mg Oral Daily  . nicotine  21 mg Transdermal Daily  . piperacillin-tazobactam (ZOSYN)  IV  3.375 g Intravenous Q8H  . sodium chloride  3 mL Intravenous Q12H  . vancomycin  1,000 mg Intravenous Q8H  . DISCONTD: enoxaparin (LOVENOX) injection  40 mg Subcutaneous Q24H  . DISCONTD: hydrocortisone sod succinate (SOLU-CORTEF) injection  50 mg Intravenous Q8H  . DISCONTD: hydrocortisone sodium succinate  25 mg Intrathecal Q8H  . DISCONTD: predniSONE  40 mg Oral Q breakfast   Continuous Infusions:  PRN Meds:.sodium chloride, alum & mag hydroxide-simeth, fentaNYL, ibuprofen, ketorolac, midazolam, morphine injection, ondansetron (ZOFRAN) IV, ondansetron, oxyCODONE, polyethylene glycol, sodium chloride, traZODone, DISCONTD: hydrocortisone sodium succinate, DISCONTD: ibuprofen Assessment/Plan:  1) Empyema - Admitted for pain and pleural effusion. CT showed moderate, possibly loculated pleural effusion. 11/19/2011 IR did diagnostic and therapeutic thoracentesis which unfortunately showed that the effusion is indeed loculated and was only incompletely drained. Thoracic surgery consult obtained to evaluate for surgical evacuation and will perform decortication  tomorrow. Vancomycin and Zosyn started 11/19/2011. Pleural LDH 1051 and a serum LDH 322. Clearly this is an exudative effusion/empyema with many WBCs (6140, predominantly neutrophils). Pleural fluid no growth to date. Gram stain, AFB and fungal smears negative. Surgery wants PFTs prior to surgery.  -- Pain control with morphine and toradol while NPO. Will switch back to oxycodone IR and ibuprofen when taking POs  -- Vancomycin and Zosyn day 3  -- surgery today -- devising plan for oral antibiotics (possibly clinda+levo)  2) rheumatoid arthritis - stable.Now getting stress dose hydrocortisone prior to surgery.  Should get 50 mg IV once preop followed by 25 mg TID x 3 days.  After this, we will continue the patient at his current dose of 40 mg prednisone for possible acute flare (2/2 left shoulder bursitis and chronic low back pain) and taper.  -- hydrocortisone burst day 1/3  3) hypertension - blood pressure well controlled on home regimen.  -- Nebivolol 5 mg by mouth daily   4) history of SLE - unlikely to be SLE has been generally do very poorly and he is currently not on therapy besides prednisone. We will continue his current therapy.   5) insomnia - pt will use trazodone tonight.   DVT prophylaxis -SCDs prior to surgery  Disposition: Pending surgical evaluation/correction for loculated parapneumonic effusion/empyema.   LOS: 3 days   [facility], Chaselynn Kepple PGY-I, Internal Medicine Resident  Pager: [phone] (7AM-5PM) 11/21/2011, 7:06 AM

## 2011-11-21 NOTE — Preoperative (Addendum)
Beta Blockers  Bystolic 5 mg taken @ 2240 11/20/11

## 2011-11-21 NOTE — Progress Notes (Signed)
PFT done. Unconfirmed copy placed in progress notes section of shadow chart.  Inocente Salles RRT, RCP 11/21/2011 8:58 AM

## 2011-11-21 NOTE — Anesthesia Procedure Notes (Signed)
Procedure Name: Intubation Date/Time: 11/21/2011 12:23 PM Performed by: Tyrone Nine Pre-anesthesia Checklist: Patient identified, Emergency Drugs available, Suction available, Patient being monitored and Timeout performed Patient Re-evaluated:Patient Re-evaluated prior to inductionOxygen Delivery Method: Circle system utilized Preoxygenation: Pre-oxygenation with 100% oxygen Intubation Type: IV induction Ventilation: Mask ventilation without difficulty Grade View: Grade I Endobronchial tube: Double lumen EBT, EBT position confirmed by fiberoptic bronchoscope and EBT position confirmed by auscultation and 39 Fr Number of attempts: 1 Airway Equipment and Method: Stylet Placement Confirmation: ETT inserted through vocal cords under direct vision,  positive ETCO2 and breath sounds checked- equal and bilateral Secured at: 29 cm Tube secured with: Tape

## 2011-11-21 NOTE — Progress Notes (Signed)
*  PRELIMINARY RESULTS* Echocardiogram 2D Echocardiogram has been performed.  [name] [name] 11/21/2011, 9:57 AM

## 2011-11-21 NOTE — Progress Notes (Signed)
Resident Co-sign Daily Note:  I have seen the patient and reviewed the daily progress note by David Girguis and discussed the care of the patient with them. See my note written earlier today for documentation of my findings, assessment, and plans.  [name] [name]  11/21/2011, 5:25 PM  

## 2011-11-21 NOTE — Anesthesia Preprocedure Evaluation (Addendum)
Anesthesia Evaluation  Patient identified by MRN, date of birth, ID band Patient awake    Reviewed: Allergy & Precautions, H&P , NPO status , Patient's Chart, lab work & pertinent test results  Airway Mallampati: I TM Distance: >3 FB Neck ROM: Full    Dental  (+) Poor Dentition   Pulmonary pneumonia , Current Smoker,  R empyema + rhonchi   + wheezing      Cardiovascular hypertension, Pt. on home beta blockers and Pt. on medications + Valvular Problems/Murmurs Rhythm:Regular Rate:Tachycardia     Neuro/Psych    GI/Hepatic   Endo/Other    Renal/GU      Musculoskeletal  (+) Arthritis -, Osteoarthritis and on steriods ,    Abdominal   Peds  Hematology   Anesthesia Other Findings   Reproductive/Obstetrics                         Anesthesia Physical Anesthesia Plan  ASA: III  Anesthesia Plan: General   Post-op Pain Management:    Induction: Intravenous  Airway Management Planned: Double Lumen EBT  Additional Equipment: Arterial line and CVP  Intra-op Plan:   Post-operative Plan: Extubation in OR and Possible Post-op intubation/ventilation  Informed Consent: I have reviewed the patients History and Physical, chart, labs and discussed the procedure including the risks, benefits and alternatives for the proposed anesthesia with the patient or authorized representative who has indicated his/her understanding and acceptance.     Plan Discussed with: CRNA and Surgeon  Anesthesia Plan Comments:         Anesthesia Quick Evaluation

## 2011-11-21 NOTE — Brief Op Note (Signed)
11/18/2011 - 11/21/2011  4:53 PM  PATIENT:  [name] [name]  51 y.o. male  PRE-OPERATIVE DIAGNOSIS:  empyema- right  POST-OPERATIVE DIAGNOSIS:  empyema right  PROCEDURE:  Procedure(s) (LRB): VIDEO ASSISTED THORACOSCOPY (VATS)/DECORTICATION (Right)  SURGEON:  Surgeon(s) and Role:    * Kerin Perna, MD - Primary  PHYSICIAN ASSISTANT: none  ASSISTANTS:RNFA Wagoner  ANESTHESIA:  GEN  EBL:  Total I/O In: 1050 [I.V.:1050] Out: 575 [Urine:375; Blood:100; Chest Tube:100]    DRAINS: 2 mediastinal drains     SPECIMEN:  Wound culture  DISPOSITION OF SPECIMEN:  Micro lab  COUNTS:  ok  TOURNIQUET:  * No tourniquets in log *  DICTATION: .done  PLAN OF CARE: Admit to inpatient   PATIENT DISPOSITION:  ICU - extubated and stable.   Delay start of Pharmacological VTE agent (>24hrs) yes

## 2011-11-21 NOTE — Progress Notes (Signed)
Internal Medicine Attending  Date: 11/21/2011  Patient name: [name] [name] Medical record number: [id number] Date of birth: 01-19-1961 Age: 51 y.o. Gender: male  I saw and evaluated the patient. I reviewed the resident's note by Dr. Candy Sledge and I agree with the resident's findings and plans as documented in his progress note.  [name] [name] was seen in the PACU post-operatively.  He notes some right sided pain.  Chest tube to suction.  He just received 1.5 mg of Dilaudid.  Will continue post-op supportive care, manage his tube thoracostomy pain, and otherwise continue his other chronic medications.

## 2011-11-22 ENCOUNTER — Inpatient Hospital Stay (HOSPITAL_COMMUNITY): Payer: Medicare HMO

## 2011-11-22 LAB — BODY FLUID CULTURE: Culture: NO GROWTH

## 2011-11-22 LAB — POCT I-STAT 3, ART BLOOD GAS (G3+)
Bicarbonate: 30.3 mEq/L — ABNORMAL HIGH (ref 20.0–24.0)
O2 Saturation: 97 %
Patient temperature: 98.3
pCO2 arterial: 43.9 mmHg (ref 35.0–45.0)
pH, Arterial: 7.447 (ref 7.350–7.450)
pO2, Arterial: 92 mmHg (ref 80.0–100.0)

## 2011-11-22 LAB — CBC
HCT: 36 % — ABNORMAL LOW (ref 39.0–52.0)
Hemoglobin: 11.6 g/dL — ABNORMAL LOW (ref 13.0–17.0)
MCH: 26.6 pg (ref 26.0–34.0)
MCHC: 32.2 g/dL (ref 30.0–36.0)
MCV: 82.6 fL (ref 78.0–100.0)
Platelets: 268 10*3/uL (ref 150–400)
RBC: 4.36 MIL/uL (ref 4.22–5.81)
RDW: 14.9 % (ref 11.5–15.5)
WBC: 16.1 10*3/uL — ABNORMAL HIGH (ref 4.0–10.5)

## 2011-11-22 LAB — COMPREHENSIVE METABOLIC PANEL
ALT: 235 U/L — ABNORMAL HIGH (ref 0–53)
AST: 127 U/L — ABNORMAL HIGH (ref 0–37)
Albumin: 2.6 g/dL — ABNORMAL LOW (ref 3.5–5.2)
Alkaline Phosphatase: 168 U/L — ABNORMAL HIGH (ref 39–117)
BUN: 18 mg/dL (ref 6–23)
CO2: 28 mEq/L (ref 19–32)
Calcium: 9.1 mg/dL (ref 8.4–10.5)
Chloride: 99 mEq/L (ref 96–112)
Creatinine, Ser: 0.78 mg/dL (ref 0.50–1.35)
GFR calc Af Amer: >90 mL/min (ref >90)
GFR calc non Af Amer: >90 mL/min (ref >90)
Glucose, Bld: 113 mg/dL — ABNORMAL HIGH (ref 70–99)
Potassium: 3.9 mEq/L (ref 3.5–5.1)
Sodium: 137 mEq/L (ref 135–145)
Total Bilirubin: 0.4 mg/dL (ref 0.3–1.2)
Total Protein: 6.6 g/dL (ref 6.0–8.3)

## 2011-11-22 LAB — GLUCOSE, CAPILLARY
Glucose-Capillary: 102 mg/dL — ABNORMAL HIGH (ref 70–99)
Glucose-Capillary: 125 mg/dL — ABNORMAL HIGH (ref 70–99)
Glucose-Capillary: 115 mg/dL — ABNORMAL HIGH (ref 70–99)

## 2011-11-22 LAB — DIFFERENTIAL
Basophils Absolute: 0 10*3/uL (ref 0.0–0.1)
Basophils Relative: 0 % (ref 0–1)
Eosinophils Absolute: 0.1 10*3/uL (ref 0.0–0.7)
Eosinophils Relative: 1 % (ref 0–5)
Lymphocytes Relative: 11 % — ABNORMAL LOW (ref 12–46)
Lymphs Abs: 1.7 10*3/uL (ref 0.7–4.0)
Monocytes Absolute: 1 10*3/uL (ref 0.1–1.0)
Monocytes Relative: 6 % (ref 3–12)
Neutro Abs: 13.3 10*3/uL — ABNORMAL HIGH (ref 1.7–7.7)
Neutrophils Relative %: 82 % — ABNORMAL HIGH (ref 43–77)

## 2011-11-22 MED ORDER — KCL IN DEXTROSE-NACL 10-5-0.45 MEQ/L-%-% IV SOLN
INTRAVENOUS | Status: DC
  Administered 2011-11-25: 20 mL/h via INTRAVENOUS
  Filled 2011-11-22 (×4): qty 1000

## 2011-11-22 MED ORDER — OXYCODONE HCL 5 MG PO TABS
5.0000 mg | ORAL_TABLET | ORAL | Status: DC | PRN
  Administered 2011-11-22 – 2011-11-26 (×18): 10 mg via ORAL
  Filled 2011-11-22 (×18): qty 2

## 2011-11-22 MED ORDER — FUROSEMIDE 10 MG/ML IJ SOLN
20.0000 mg | Freq: Once | INTRAMUSCULAR | Status: AC
  Administered 2011-11-22: 20 mg via INTRAVENOUS
  Filled 2011-11-22: qty 2

## 2011-11-22 NOTE — Progress Notes (Signed)
ANTIBIOTIC CONSULT NOTE - FOLLOW UP  Pharmacy Consult for Vancomycin Indication: pneumonia with empyema  Allergies  Allergen Reactions  . Ramipril (Ramipril) Rash  . Tramadol Itching and Rash   Vital Signs: Temp: 97.8 F (36.6 C) (05/29 1155) Temp src: Oral (05/29 1155) BP: 134/92 mmHg (05/29 1100) Pulse Rate: 77  (05/29 1100) Intake/Output from previous day: 05/28 0701 - 05/29 0700 In: 3109.5 [P.O.:50; I.V.:2255.5; IV Piggyback:804] Out: 1690 [Urine:1260; Blood:100; Chest Tube:330] Intake/Output from this shift: Total I/O In: 870.1 [P.O.:470; I.V.:400.1] Out: 710 [Urine:700; Chest Tube:10]  Labs:  [facility] 11/22/11 0400 11/21/11 0512 11/20/11 2001 11/20/11 0535  WBC 16.1* 14.3* 16.0* --  HGB 11.6* 12.2* 12.1* --  PLT 268 285 299 --  LABCREA -- -- -- --  CREATININE 0.78 0.88 -- 0.72   Estimated Creatinine Clearance: 102.1 ml/min (by C-G formula based on Cr of 0.78).  Assessment: Vanc 5/26>> Zosyn 5/26>> 5/26 - pleural fluid cx>>NGTD 5/26 - MRSA screen negative 5/28 - pleural fluid cx>>NGTD 5/28 - tissue cx>>  50yom now s/p right VATS, decortication of RLL and drainage of empyema (POD#1) continues on antibiotics. Renal function is stable. Noted plan to switch to augmentin soon so will hold off on getting a trough.  Goal of Therapy:  Vancomycin trough level 15-20 mcg/ml  Plan:  1) Continue Vancomycin 1g IV q8 2) Continue Zosysn 3.375g IV q8 3) Continue to follow renal function, cultures, switch to po abx  Fredrik Rigger 11/22/2011,12:47 PM

## 2011-11-22 NOTE — Progress Notes (Signed)
1 Day Post-Op Procedure(s) (LRB): VIDEO ASSISTED THORACOSCOPY (VATS)/DECORTICATION (Right) Subjective: R VATS decort of empyema, immunosuppressed lupus No air leak,min drainage        Vital signs in last 24 hours: Temp:  [97.3 F (36.3 C)-98.3 F (36.8 C)] 97.5 F (36.4 C) (05/29 1600) Pulse Rate:  [56-83] 72  (05/29 1500) Cardiac Rhythm:  [-] Normal sinus rhythm (05/29 0830) Resp:  [5-25] 17  (05/29 1640) BP: (113-174)/(78-110) 116/78 mmHg (05/29 1500) SpO2:  [93 %-100 %] 97 % (05/29 1640) Arterial Line BP: (133-191)/(61-102) 149/79 mmHg (05/29 1500) Weight:  [168 lb 14 oz (76.6 kg)] 168 lb 14 oz (76.6 kg) (05/29 0500)  Hemodynamic parameters for last 24 hours:  stable  Intake/Output from previous day: 05/28 0701 - 05/29 0700 In: 3109.5 [P.O.:50; I.V.:2255.5; IV Piggyback:804] Out: 1690 [Urine:1260; Blood:100; Chest Tube:330] Intake/Output this shift: Total I/O In: 1380.1 [P.O.:470; I.V.:550.1; IV Piggyback:360] Out: 900 [Urine:890; Chest Tube:10]  Lungs: clear to auscultation bilaterally  Lab Results:  Basename 11/22/11 0400 11/21/11 0512  WBC 16.1* 14.3*  HGB 11.6* 12.2*  HCT 36.0* 38.5*  PLT 268 285   BMET:  Basename 11/22/11 0400 11/21/11 0512  NA 137 139  K 3.9 3.5  CL 99 101  CO2 28 27  GLUCOSE 113* 108*  BUN 18 17  CREATININE 0.78 0.88  CALCIUM 9.1 9.5    PT/INR: No results found for this basename: LABPROT,INR in the last 72 hours ABG    Component Value Date/Time   PHART 7.447 11/22/2011 0354   HCO3 30.3* 11/22/2011 0354   TCO2 32 11/22/2011 0354   O2SAT 97.0 11/22/2011 0354   CBG (last 3)   Basename 11/22/11 1535 11/22/11 1152 11/22/11 0805  GLUCAP 163* 125* 114*    Assessment/Plan: S/P Procedure(s) (LRB): VIDEO ASSISTED THORACOSCOPY (VATS)/DECORTICATION (Right) Cont ICU cre LFTs elev will get CMET in AM   LOS: 4 days    VAN TRIGT III,Israel Werts 11/22/2011

## 2011-11-22 NOTE — Progress Notes (Signed)
Resident Co-sign Daily Note:   I have seen the patient and reviewed the daily progress note by Lewie Chamber and discussed the care of the patient with them. See my note below for documentation of my findings, assessment, and plans.      INTERNAL MEDICINE DAILY PROGRESS NOTE  [name] [name] MRN: [id number] DOB/AGE: Dec 24, 1960 51 y.o.  Admit date: 11/18/2011  Subjective: [name] [name] states he is doing well after surgery. Pain is well controlled with fentanyl PCA. He denies shortness of breath  Objective: Vital signs in last 24 hours: Filed Vitals:   11/22/11 0938 11/22/11 1000 11/22/11 1100 11/22/11 1132  BP:  137/91 134/92   Pulse:  79 77   Temp:      TempSrc:      Resp: 22 22 19    Height:      Weight:      SpO2:  98% 96% 99%   Weight change:   Intake/Output Summary (Last 24 hours) at 11/22/11 1142 Last data filed at 11/22/11 0700  Gross per 24 hour  Intake 3109.5 ml  Output   1690 ml  Net 1419.5 ml   Physical Exam: Physical Exam: General: resting in chair Cardiac: RRR, no rubs, murmurs or gallops   Pulm: 2 chest tubes draining serosanguineous fluid at 20 of suction.    Overall improved aeration of the lungs bilaterally. Coarse inspiratory crackles throughout lung fields. Ext: warm and well perfused, no pedal edema, SCDs not on patient  Lab Results: Basic Metabolic Panel:  Lab 11/22/11 1308 11/21/11 0512  NA 137 139  K 3.9 3.5  CL 99 101  CO2 28 27  GLUCOSE 113* 108*  BUN 18 17  CREATININE 0.78 0.88  CALCIUM 9.1 9.5  MG -- --  PHOS -- --   Liver Function Tests:  Lab 11/22/11 0400 11/20/11 0535  AST 127* 37  ALT 235* 79*  ALKPHOS 168* 139*  BILITOT 0.4 0.2*  PROT 6.6 7.5  ALBUMIN 2.6* 2.7*   CBC:  Lab 11/22/11 0400 11/21/11 0512 11/19/11 1150  WBC 16.1* 14.3* --  NEUTROABS 13.3* -- 15.2*  HGB 11.6* 12.2* --  HCT 36.0* 38.5* --  MCV 82.6 83.7 --  PLT 268 285 --   Cardiac Enzymes:  Lab 11/19/11 2329 11/19/11 1804 11/19/11 1151  CKTOTAL 12  15 13   CKMB 0.9 0.9 0.8  CKMBINDEX -- -- --  TROPONINI <0.30 <0.30 <0.30   D-Dimer:  Lab 11/18/11 1540  DDIMER 2.62*   CBG:  Lab 11/22/11 0805 11/21/11 2346 11/21/11 1927  GLUCAP 114* 132* 131*   Hemoglobin A1C:  Lab 11/19/11 1413  HGBA1C 5.8*   Thyroid Function Tests:  Lab 11/20/11 0535  TSH 0.782  T4TOTAL --  FREET4 --  T3FREE --  THYROIDAB --   Coagulation:  Lab 11/19/11 1413 11/18/11 1540  LABPROT 12.6 12.5  INR 0.92 0.91   Anemia Panel: No results found for this basename: VITAMINB12,FOLATE,FERRITIN,TIBC,IRON,RETICCTPCT in the last 168 hours Urine Drug Screen: Drugs of Abuse     Component Value Date/Time   LABOPIA NEG 08/30/2010 1653   LABOPIA POS* 02/22/2010 0000   COCAINSCRNUR NEG 08/30/2010 1653   COCAINSCRNUR NONE DETECTED 07/24/2009 1648   LABBENZ POS* 08/30/2010 1653   LABBENZ POSITIVE* 07/24/2009 1648   AMPHETMU NEG 08/30/2010 1653   AMPHETMU NONE DETECTED 07/24/2009 1648   THCU NONE DETECTED 07/24/2009 1648   LABBARB  Value: NONE DETECTED        DRUG SCREEN FOR MEDICAL PURPOSES ONLY.  IF CONFIRMATION  IS NEEDED FOR ANY PURPOSE, NOTIFY LAB WITHIN 5 DAYS.        LOWEST DETECTABLE LIMITS FOR URINE DRUG SCREEN Drug Class       Cutoff (ng/mL) Amphetamine      1000 Barbiturate      200 Benzodiazepine   200 Tricyclics       300 Opiates          300 Cocaine          300 THC              50 07/24/2009 1648    Alcohol Level: No results found for this basename: ETH:2 in the last 168 hours Urinalysis:  Lab 11/19/11 1740 11/18/11 1524  COLORURINE YELLOW YELLOW  LABSPEC 1.013 1.017  PHURINE 7.0 6.0  GLUCOSEU NEGATIVE NEGATIVE  HGBUR NEGATIVE NEGATIVE  BILIRUBINUR NEGATIVE NEGATIVE  KETONESUR NEGATIVE NEGATIVE  PROTEINUR NEGATIVE NEGATIVE  UROBILINOGEN 0.2 0.2  NITRITE NEGATIVE NEGATIVE  LEUKOCYTESUR NEGATIVE NEGATIVE     Micro Results: Recent Results (from the past 240 hour(s))  AFB CULTURE WITH SMEAR     Status: Normal (Preliminary result)   Collection Time     11/18/11  9:48 AM      Component Value Range Status Comment   Specimen Description PLEURAL   Final    Special Requests NONE   Final    ACID FAST SMEAR NO ACID FAST BACILLI SEEN   Final    Culture     Final    Value: CULTURE WILL BE EXAMINED FOR 6 WEEKS BEFORE ISSUING A FINAL REPORT   Report Status PENDING   Incomplete   ANAEROBIC CULTURE     Status: Normal (Preliminary result)   Collection Time   11/19/11  9:44 AM      Component Value Range Status Comment   Specimen Description PLEURAL RIGHT FLUID   Final    Special Requests NONE   Final    Gram Stain     Final    Value: RARE WBC PRESENT,BOTH PMN AND MONONUCLEAR     NO ORGANISMS SEEN   Culture     Final    Value: NO ANAEROBES ISOLATED; CULTURE IN PROGRESS FOR 5 DAYS   Report Status PENDING   Incomplete   BODY FLUID CULTURE     Status: Normal (Preliminary result)   Collection Time   11/19/11  9:48 AM      Component Value Range Status Comment   Specimen Description PLEURAL RIGHT FLUID   Final    Special Requests NONE   Final    Gram Stain     Final    Value: RARE WBC PRESENT,BOTH PMN AND MONONUCLEAR     NO ORGANISMS SEEN   Culture NO GROWTH 2 DAYS   Final    Report Status PENDING   Incomplete   FUNGAL STAIN     Status: Normal   Collection Time   11/19/11  9:53 AM      Component Value Range Status Comment   Specimen Description PLEURAL RIGHT FLUID   Final    Special Requests NONE   Final    Fungal Smear NO YEAST OR FUNGAL ELEMENTS SEEN   Final    Report Status 11/20/2011 FINAL   Final   SURGICAL PCR SCREEN     Status: Normal   Collection Time   11/19/11  2:16 PM      Component Value Range Status Comment   MRSA, PCR NEGATIVE  NEGATIVE  Final  Staphylococcus aureus NEGATIVE  NEGATIVE  Final   BODY FLUID CULTURE     Status: Normal (Preliminary result)   Collection Time   11/21/11  3:58 PM      Component Value Range Status Comment   Specimen Description PLEURAL FLUID RIGHT   Final    Special Requests SWAB RECIEVED   Final     Gram Stain     Final    Value: RARE WBC PRESENT,BOTH PMN AND MONONUCLEAR     NO ORGANISMS SEEN   Culture NO GROWTH   Final    Report Status PENDING   Incomplete   ANAEROBIC CULTURE     Status: Normal (Preliminary result)   Collection Time   11/21/11  3:58 PM      Component Value Range Status Comment   Specimen Description PLEURAL FLUID RIGHT   Final    Special Requests SWAB RECIEVED   Final    Gram Stain PENDING   Incomplete    Culture     Final    Value: NO ANAEROBES ISOLATED; CULTURE IN PROGRESS FOR 5 DAYS   Report Status PENDING   Incomplete   TISSUE CULTURE     Status: Normal (Preliminary result)   Collection Time   11/21/11  4:06 PM      Component Value Range Status Comment   Specimen Description TISSUE   Final    Special Requests PLEURAL PEEL   Final    Gram Stain     Final    Value: FEW WBC PRESENT,BOTH PMN AND MONONUCLEAR     RARE SQUAMOUS EPITHELIAL CELLS PRESENT     NO ORGANISMS SEEN   Culture NO GROWTH   Final    Report Status PENDING   Incomplete   ANAEROBIC CULTURE     Status: Normal (Preliminary result)   Collection Time   11/21/11  4:06 PM      Component Value Range Status Comment   Specimen Description TISSUE   Final    Special Requests PLEURAL PEEL   Final    Gram Stain PENDING   Incomplete    Culture     Final    Value: NO ANAEROBES ISOLATED; CULTURE IN PROGRESS FOR 5 DAYS   Report Status PENDING   Incomplete    Studies/Results: Dg Chest Port 1 View  11/22/2011  *RADIOLOGY REPORT*  Clinical Data: Video assisted thoracoscopic surgery  PORTABLE CHEST - 1 VIEW  Comparison: Yesterday  Findings: Lungs remain under aerated   and basilar atelectasis is stable.  Endotracheal tube removed.  Right subclavian central venous catheter and right chest tubes stable.  Pneumothorax has resolved.  No vascularity.  IMPRESSION: Resolved pneumothorax.  Extubated.  Stable basilar atelectasis.  Original Report Authenticated By: Donavan Burnet, M.D.   Dg Chest Port 1  View  11/21/2011  *RADIOLOGY REPORT*  Clinical Data: Barely.  Right thoracotomy.  PORTABLE CHEST - 1 VIEW  Comparison: 11/21/2011  Findings: Two right chest tubes are in place.  Small right apical pneumothorax.  Right lower lobe atelectasis or infiltrate.  Right central line is in place with the tip in the SVC.  Endotracheal tube in place with extension into the left main stem bronchus. Mild cardiomegaly.  IMPRESSION: Support devices as above.  Tiny right apical pneumothorax.  Right lower lobe atelectasis or infiltrate.  Original Report Authenticated By: Cyndie Chime, M.D.   Dg Chest Port 1 View  11/21/2011  *RADIOLOGY REPORT*  Clinical Data: Preoperative respiratory exam for thoracotomy and drainage of  loculated right pleural fluid collection.  PORTABLE CHEST - 1 VIEW  Comparison: 11/20/2011  Findings: Increased airspace opacity at the right lung base. Otherwise fairly stable appearance to a laterally loculated pleural fluid collection.  No edema.  Stable heart size.  IMPRESSION: Increased airspace disease at the right lung base.  Original Report Authenticated By: Reola Calkins, M.D.   Medications: I have reviewed the patient's current medications. Scheduled Meds:   . acetaminophen  1,000 mg Intravenous Q6H  . albuterol  2.5 mg Nebulization Q4H WA  . bisacodyl  10 mg Oral Daily  . docusate sodium  100 mg Oral BID  . fentaNYL   Intravenous Q4H  . hydrocortisone sod succinate (SOLU-CORTEF) injection  25 mg Intravenous Q8H  . hydrocortisone sod succinate (SOLU-CORTEF) injection  50 mg Intravenous Once  . insulin aspart  0-24 Units Subcutaneous Q4H  . mulitivitamin with minerals  1 tablet Oral Daily  . nebivolol  5 mg Oral Daily  . nicotine  21 mg Transdermal Daily  . piperacillin-tazobactam (ZOSYN)  IV  3.375 g Intravenous Q8H  . sodium chloride  3 mL Intravenous Q12H  . vancomycin  1,000 mg Intravenous Q8H  . DISCONTD: bupivacaine 0.5 % ON-Q pump SINGLE CATH 400 mL  400 mL Other To OR  .  DISCONTD: bupivacaine 0.5 % ON-Q pump SINGLE CATH 400 mL  400 mL Other To OR   Continuous Infusions:   . bupivacaine ON-Q pain pump    . dextrose 5 % and 0.45 % NaCl with KCl 10 mEq/L 75 mL/hr at 11/21/11 1922  . DISCONTD: dextrose 5 % and 0.45% NaCl 100 mL/hr at 11/21/11 0749   PRN Meds:.sodium chloride, alum & mag hydroxide-simeth, diphenhydrAMINE, diphenhydrAMINE, fentaNYL, ibuprofen, ketorolac, morphine injection, naloxone, ondansetron (ZOFRAN) IV, ondansetron (ZOFRAN) IV, oxyCODONE, polyethylene glycol, potassium chloride, senna-docusate, sodium chloride, sodium chloride, traZODone, DISCONTD: 0.9 % irrigation (POUR BTL), DISCONTD: fentaNYL, DISCONTD: fentaNYL, DISCONTD: hemostatic agents DISCONTD:  HYDROmorphone (DILAUDID) injection, DISCONTD:  HYDROmorphone (DILAUDID) injection, DISCONTD: LORazepam, DISCONTD: midazolam, DISCONTD: midazolam, DISCONTD: ondansetron (ZOFRAN) IV, DISCONTD: ondansetron, DISCONTD: oxyCODONE Assessment/Plan:    1) Empyema - Admitted for pain and pleural effusion. CT showed moderate, possibly loculated pleural effusion. 11/19/2011 IR did diagnostic and therapeutic thoracentesis which unfortunately showed that the effusion is indeed loculated and was only incompletely drained. Thoracic surgery consult obtained to evaluate for surgical evacuation and will perform decortication tomorrow. Vancomycin and Zosyn started 11/19/2011. Pleural LDH 1051 and a serum LDH 322. Clearly this is an exudative effusion/empyema with many WBCs (6140, predominantly neutrophils). Pleural fluid no growth to date. Gram stain, AFB and fungal smears negative.  Postop day 1 from right vats and decortication. -- Pain control with bupivicane pump and fentanyl PCA as well as morphine and toradol. Will switch back to oxycodone IR and ibuprofen when taking POs   -- Vancomycin and Zosyn day 5  -- Will plan to discharge patient on Augmentin  2) rheumatoid arthritis - stable.Now getting stress dose  hydrocortisone prior to surgery.  Received 50 mg IV once preop followed by 25 mg TID x 3 days.  After this, we will continue the patient at his current dose of 40 mg prednisone for possible acute flare (2/2 left shoulder bursitis and chronic low back pain) and taper.   -- hydrocortisone burst day 2/3  3) hypertension - blood pressure elevated overnight, otherwise well controlled on home regimen.  -- Nebivolol 5 mg by mouth daily    4) history of SLE - unlikely to be  SLE given negative workup and men with SLE  generally do very poorly and he is currently not on therapy besides prednisone. We will continue his current therapy.    5) insomnia - stable, patient will continue to use trazodone  DVT prophylaxis -SCDs prior to surgery  Disposition: Pending recovery from surgical evaluation/correction for loculated parapneumonic effusion/empyema.   LOS: 4 days  Tawan Degroote       PGY-I, Internal Medicine Resident  Pager: [phone] (7AM-5PM) 11:44 AM

## 2011-11-22 NOTE — Progress Notes (Signed)
Medical Student Daily Progress Note  Subjective: Patient is recovering from his right decortication from yesterday. He is awake, alert, and his pain is being well managed. He tolerated the procedure well, his right lower parietal pleura was dissected and removed successfully. He has 2 drains from his right chest (anterior and posterior pleural spaces) draining excess fluid off. He is sitting up in his chair in no distress upon exam. PCA pump and call bell within reach.  Objective: Vital signs in last 24 hours: Filed Vitals:   11/22/11 0645 11/22/11 0700 11/22/11 0736 11/22/11 0809  BP: 131/88 124/85    Pulse: 70 67    Temp:    97.6 F (36.4 C)  TempSrc:    Oral  Resp: 19 15    Height:      Weight:      SpO2: 97% 97% 98%    Weight change:   Intake/Output Summary (Last 24 hours) at 11/22/11 0814 Last data filed at 11/22/11 0700  Gross per 24 hour  Intake 3109.5 ml  Output   1690 ml  Net 1419.5 ml   Physical Exam: BP 124/85  Pulse 67  Temp(Src) 97.6 F (36.4 C) (Oral)  Resp 15  Ht 5\' 7"  (1.702 m)  Wt 76.6 kg (168 lb 14 oz)  BMI 26.45 kg/m2  SpO2 98% General appearance: alert, cooperative and no distress Head: Normocephalic, without obvious abnormality, atraumatic Neck: no adenopathy, no carotid bruit, no JVD, supple, symmetrical, trachea midline and thyroid not enlarged, symmetric, no tenderness/mass/nodules Back: symmetric, no curvature. ROM normal. No CVA tenderness. Lungs: L clear, R decreased sounds  Chest wall: right sided chest wall tenderness, 2 chest tubes draining R pleural space (anterior/posterior) Heart: regular rate and rhythm, S1, S2 normal, no murmur, click, rub or gallop Abdomen: soft, non-tender; bowel sounds normal; no masses,  no organomegaly Extremities: extremities normal, atraumatic, no cyanosis or edema Skin: Skin color, texture, turgor normal. No rashes or lesions Lab Results: Basic Metabolic Panel:  Lab 11/22/11 1610 11/21/11 0512  NA 137 139    K 3.9 3.5  CL 99 101  CO2 28 27  GLUCOSE 113* 108*  BUN 18 17  CREATININE 0.78 0.88  CALCIUM 9.1 9.5  MG -- --  PHOS -- --   Liver Function Tests:  Lab 11/22/11 0400 11/20/11 0535  AST 127* 37  ALT 235* 79*  ALKPHOS 168* 139*  BILITOT 0.4 0.2*  PROT 6.6 7.5  ALBUMIN 2.6* 2.7*   No results found for this basename: LIPASE:2,AMYLASE:2 in the last 168 hours No results found for this basename: AMMONIA:2 in the last 168 hours CBC:  Lab 11/22/11 0400 11/21/11 0512 11/19/11 1150  WBC 16.1* 14.3* --  NEUTROABS 13.3* -- 15.2*  HGB 11.6* 12.2* --  HCT 36.0* 38.5* --  MCV 82.6 83.7 --  PLT 268 285 --   Cardiac Enzymes:  Lab 11/19/11 2329 11/19/11 1804 11/19/11 1151  CKTOTAL 12 15 13   CKMB 0.9 0.9 0.8  CKMBINDEX -- -- --  TROPONINI <0.30 <0.30 <0.30   BNP: No results found for this basename: PROBNP:3 in the last 168 hours D-Dimer:  Lab 11/18/11 1540  DDIMER 2.62*   CBG:  Lab 11/21/11 2346 11/21/11 1927  GLUCAP 132* 131*   Hemoglobin A1C:  Lab 11/19/11 1413  HGBA1C 5.8*   Fasting Lipid Panel: No results found for this basename: CHOL,HDL,LDLCALC,TRIG,CHOLHDL,LDLDIRECT in the last 960 hours Thyroid Function Tests:  Lab 11/20/11 0535  TSH 0.782  T4TOTAL --  FREET4 --  T3FREE --  THYROIDAB --   Coagulation:  Lab 11/19/11 1413 11/18/11 1540  LABPROT 12.6 12.5  INR 0.92 0.91   Anemia Panel: No results found for this basename: VITAMINB12,FOLATE,FERRITIN,TIBC,IRON,RETICCTPCT in the last 168 hours Urine Drug Screen: Drugs of Abuse     Component Value Date/Time   LABOPIA NEG 08/30/2010 1653   LABOPIA POS* 02/22/2010 0000   COCAINSCRNUR NEG 08/30/2010 1653   COCAINSCRNUR NONE DETECTED 07/24/2009 1648   LABBENZ POS* 08/30/2010 1653   LABBENZ POSITIVE* 07/24/2009 1648   AMPHETMU NEG 08/30/2010 1653   AMPHETMU NONE DETECTED 07/24/2009 1648   THCU NONE DETECTED 07/24/2009 1648   LABBARB  Value: NONE DETECTED        DRUG SCREEN FOR MEDICAL PURPOSES ONLY.  IF  CONFIRMATION IS NEEDED FOR ANY PURPOSE, NOTIFY LAB WITHIN 5 DAYS.        LOWEST DETECTABLE LIMITS FOR URINE DRUG SCREEN Drug Class       Cutoff (ng/mL) Amphetamine      1000 Barbiturate      200 Benzodiazepine   200 Tricyclics       300 Opiates          300 Cocaine          300 THC              50 07/24/2009 1648    Alcohol Level: No results found for this basename: ETH:2 in the last 168 hours Urinalysis:  Lab 11/19/11 1740 11/18/11 1524  COLORURINE YELLOW YELLOW  LABSPEC 1.013 1.017  PHURINE 7.0 6.0  GLUCOSEU NEGATIVE NEGATIVE  HGBUR NEGATIVE NEGATIVE  BILIRUBINUR NEGATIVE NEGATIVE  KETONESUR NEGATIVE NEGATIVE  PROTEINUR NEGATIVE NEGATIVE  UROBILINOGEN 0.2 0.2  NITRITE NEGATIVE NEGATIVE  LEUKOCYTESUR NEGATIVE NEGATIVE    Micro Results: Recent Results (from the past 240 hour(s))  AFB CULTURE WITH SMEAR     Status: Normal (Preliminary result)   Collection Time   11/18/11  9:48 AM      Component Value Range Status Comment   Specimen Description PLEURAL   Final    Special Requests NONE   Final    ACID FAST SMEAR NO ACID FAST BACILLI SEEN   Final    Culture     Final    Value: CULTURE WILL BE EXAMINED FOR 6 WEEKS BEFORE ISSUING A FINAL REPORT   Report Status PENDING   Incomplete   ANAEROBIC CULTURE     Status: Normal (Preliminary result)   Collection Time   11/19/11  9:44 AM      Component Value Range Status Comment   Specimen Description PLEURAL RIGHT FLUID   Final    Special Requests NONE   Final    Gram Stain     Final    Value: RARE WBC PRESENT,BOTH PMN AND MONONUCLEAR     NO ORGANISMS SEEN   Culture PENDING   Incomplete    Report Status PENDING   Incomplete   BODY FLUID CULTURE     Status: Normal (Preliminary result)   Collection Time   11/19/11  9:48 AM      Component Value Range Status Comment   Specimen Description PLEURAL RIGHT FLUID   Final    Special Requests NONE   Final    Gram Stain     Final    Value: RARE WBC PRESENT,BOTH PMN AND MONONUCLEAR     NO ORGANISMS  SEEN   Culture NO GROWTH 2 DAYS   Final    Report Status PENDING  Incomplete   FUNGAL STAIN     Status: Normal   Collection Time   11/19/11  9:53 AM      Component Value Range Status Comment   Specimen Description PLEURAL RIGHT FLUID   Final    Special Requests NONE   Final    Fungal Smear NO YEAST OR FUNGAL ELEMENTS SEEN   Final    Report Status 11/20/2011 FINAL   Final   SURGICAL PCR SCREEN     Status: Normal   Collection Time   11/19/11  2:16 PM      Component Value Range Status Comment   MRSA, PCR NEGATIVE  NEGATIVE  Final    Staphylococcus aureus NEGATIVE  NEGATIVE  Final   BODY FLUID CULTURE     Status: Normal (Preliminary result)   Collection Time   11/21/11  3:58 PM      Component Value Range Status Comment   Specimen Description PLEURAL FLUID RIGHT   Final    Special Requests SWAB RECIEVED   Final    Gram Stain     Final    Value: RARE WBC PRESENT,BOTH PMN AND MONONUCLEAR     NO ORGANISMS SEEN   Culture NO GROWTH   Final    Report Status PENDING   Incomplete   TISSUE CULTURE     Status: Normal (Preliminary result)   Collection Time   11/21/11  4:06 PM      Component Value Range Status Comment   Specimen Description TISSUE   Final    Special Requests PLEURAL PEEL   Final    Gram Stain     Final    Value: FEW WBC PRESENT,BOTH PMN AND MONONUCLEAR     RARE SQUAMOUS EPITHELIAL CELLS PRESENT     NO ORGANISMS SEEN   Culture NO GROWTH   Final    Report Status PENDING   Incomplete    Studies/Results: Dg Chest Port 1 View  11/21/2011  *RADIOLOGY REPORT*  Clinical Data: Barely.  Right thoracotomy.  PORTABLE CHEST - 1 VIEW  Comparison: 11/21/2011  Findings: Two right chest tubes are in place.  Small right apical pneumothorax.  Right lower lobe atelectasis or infiltrate.  Right central line is in place with the tip in the SVC.  Endotracheal tube in place with extension into the left main stem bronchus. Mild cardiomegaly.  IMPRESSION: Support devices as above.  Tiny right apical  pneumothorax.  Right lower lobe atelectasis or infiltrate.  Original Report Authenticated By: Cyndie Chime, M.D.   Dg Chest Port 1 View  11/21/2011  *RADIOLOGY REPORT*  Clinical Data: Preoperative respiratory exam for thoracotomy and drainage of loculated right pleural fluid collection.  PORTABLE CHEST - 1 VIEW  Comparison: 11/20/2011  Findings: Increased airspace opacity at the right lung base. Otherwise fairly stable appearance to a laterally loculated pleural fluid collection.  No edema.  Stable heart size.  IMPRESSION: Increased airspace disease at the right lung base.  Original Report Authenticated By: Reola Calkins, M.D.   Medications:  I have reviewed the patient's current medications. Prior to Admission:  Prescriptions prior to admission  Medication Sig Dispense Refill  . acetaminophen (TYLENOL) 500 MG tablet Take 1 tablet (500 mg total) by mouth every 6 (six) hours as needed for pain or fever (or Fever >/= 101).  60 tablet  0  . guaiFENesin (MUCINEX) 600 MG 12 hr tablet Take 1,200 mg by mouth 2 (two) times daily.      . Multiple Vitamin (MULITIVITAMIN  WITH MINERALS) TABS Take 1 tablet by mouth daily.      . nebivolol (BYSTOLIC) 5 MG tablet Take 1 tablet (5 mg total) by mouth daily.  30 tablet  1  . predniSONE (DELTASONE) 10 MG tablet Take 4 tablets daily for 4 days ( total 40 mg ), then 3 tablets daily for next 4 days ( total 30 mg), then 2 tablets ( 20 mg ) daily for next 4 days and then 10 mg daily ( 1 tablet ) until you come back to clinic.  100 tablet  0  . HYDROcodone-acetaminophen (NORCO) 5-325 MG per tablet Take 1 tablet by mouth every 4 (four) hours as needed.  20 tablet  0   Anti-infectives     Start     Dose/Rate Route Frequency Ordered Stop   11/19/11 2000  piperacillin-tazobactam (ZOSYN) IVPB 3.375 g       3.375 g 12.5 mL/hr over 240 Minutes Intravenous Every 8 hours 11/19/11 1256     11/19/11 1359   cefUROXime (ZINACEF) 1.5 g in dextrose 5 % 50 mL IVPB  Status:   Discontinued        1.5 g 100 mL/hr over 30 Minutes Intravenous 60 min pre-op 11/19/11 1359 11/19/11 1426   11/19/11 1200   vancomycin (VANCOCIN) IVPB 1000 mg/200 mL premix        1,000 mg 200 mL/hr over 60 Minutes Intravenous Every 8 hours 11/19/11 1052     11/19/11 1100  piperacillin-tazobactam (ZOSYN) IVPB 3.375 g       3.375 g 12.5 mL/hr over 240 Minutes Intravenous 4 times per day 11/19/11 1035 11/19/11 1521         Scheduled Meds:   . acetaminophen  1,000 mg Intravenous Q6H  . albuterol  2.5 mg Nebulization Once  . albuterol  2.5 mg Nebulization Q4H WA  . bisacodyl  10 mg Oral Daily  . docusate sodium  100 mg Oral BID  . fentaNYL   Intravenous Q4H  . hydrocortisone sod succinate (SOLU-CORTEF) injection  25 mg Intravenous Q8H  . hydrocortisone sod succinate (SOLU-CORTEF) injection  50 mg Intravenous Once  . insulin aspart  0-24 Units Subcutaneous Q4H  . mulitivitamin with minerals  1 tablet Oral Daily  . nebivolol  5 mg Oral Daily  . nicotine  21 mg Transdermal Daily  . piperacillin-tazobactam (ZOSYN)  IV  3.375 g Intravenous Q8H  . sodium chloride  3 mL Intravenous Q12H  . vancomycin  1,000 mg Intravenous Q8H  . DISCONTD: bupivacaine 0.5 % ON-Q pump SINGLE CATH 400 mL  400 mL Other To OR  . DISCONTD: bupivacaine 0.5 % ON-Q pump SINGLE CATH 400 mL  400 mL Other To OR   Continuous Infusions:   . bupivacaine ON-Q pain pump    . dextrose 5 % and 0.45 % NaCl with KCl 10 mEq/L 75 mL/hr at 11/21/11 1922  . DISCONTD: dextrose 5 % and 0.45% NaCl 100 mL/hr at 11/21/11 0749   PRN Meds:.sodium chloride, alum & mag hydroxide-simeth, diphenhydrAMINE, diphenhydrAMINE, fentaNYL, ibuprofen, ketorolac, morphine injection, naloxone, ondansetron (ZOFRAN) IV, ondansetron (ZOFRAN) IV, oxyCODONE, polyethylene glycol, potassium chloride, senna-docusate, sodium chloride, sodium chloride, traZODone, DISCONTD: 0.9 % irrigation (POUR BTL), DISCONTD: fentaNYL, DISCONTD: fentaNYL, DISCONTD:  hemostatic agents DISCONTD:  HYDROmorphone (DILAUDID) injection, DISCONTD:  HYDROmorphone (DILAUDID) injection, DISCONTD: LORazepam, DISCONTD: midazolam, DISCONTD: midazolam, DISCONTD: ondansetron (ZOFRAN) IV, DISCONTD: ondansetron, DISCONTD: oxyCODONE Assessment/Plan:  Empyema of right pleural space - VATS and right lower pleural decortication performed 11/21/11, with no major complications.  He did have a small right apical pneumothorax which has now resolved as of this morning and a stable right basilar atelectasis.  - Appreciate TCTS consult and treatment  - continue Vanc (5/26>>) and Zosyn (5/26>>) - will switch to oral abx (Augmentin) upon pt. transfer out of SICU to regular bed - fentanyl PCA, morphine, ketorolac, oxy IR: PRN pain  RA - Stable. Well controlled on home regimen. Will give him stress dose of hydrocortisone beginning today for his procedure.  - Hydrocortisone 50 mg x 1 @ 0800 (5/28)  - Hydrocortisone 25mg  Q8H x 3 days (5/28)  - resume prednisone after high dose regimen complete (5/31), and will taper back down to home dose  Hypertension - Well controlled on his home regimen.  - continue nebivolol 5 mg daily   History of SLE - Patient has history of SLE, currently stable and no management needed.  - last known therapy about 4 years ago   DVT ppx - SCDs  Disposition - Patient will continue to recover today in SICU, his pain will be managed and transfer to general bed likely tomorrow as long as continues to improve.   LOS: 4 days   This is a Psychologist, occupational Note.  The care of the patient was discussed with Dr. Quentin Ore and the assessment and plan formulated with their assistance.  Please see their attached note for official documentation of the daily encounter.  Lewie Chamber 11/22/2011, 8:14 AM

## 2011-11-22 NOTE — Progress Notes (Signed)
Internal Medicine Attending  Date: 11/22/2011  Patient name: [name] [name] Medical record number: [id number] Date of birth: Nov 26, 1960 Age: 51 y.o. Gender: male  I saw and evaluated the patient. I reviewed the resident's note by Dr. Candy Sledge and I agree with the resident's findings and plans as documented in his progress note.  Mr. Keltner was seen on rounds this morning. His pain was markedly improved on the fentanyl PCA. He was without any acute complaints. The cause of his mild bump in transaminases is unclear but may be related to the right thoracic decortication, secondary to anesthesia, or secondary to medications. We will continue current supportive care but discontinue the IV acetaminophen given his transaminase bump.

## 2011-11-22 NOTE — Progress Notes (Signed)
TCTS BRIEF SICU PROGRESS NOTE  1 Day Post-Op  S/P Procedure(s) (LRB): VIDEO ASSISTED THORACOSCOPY (VATS)/DECORTICATION (Right)   Stable day Adequate pain control Breathing comfortably  Plan: Continue current plan  Rion Schnitzer H 11/22/2011 7:56 PM

## 2011-11-23 ENCOUNTER — Inpatient Hospital Stay (HOSPITAL_COMMUNITY): Payer: Medicare HMO

## 2011-11-23 LAB — GLUCOSE, CAPILLARY
Glucose-Capillary: 117 mg/dL — ABNORMAL HIGH (ref 70–99)
Glucose-Capillary: 101 mg/dL — ABNORMAL HIGH (ref 70–99)
Glucose-Capillary: 131 mg/dL — ABNORMAL HIGH (ref 70–99)
Glucose-Capillary: 99 mg/dL (ref 70–99)

## 2011-11-23 LAB — CBC
HCT: 34.2 % — ABNORMAL LOW (ref 39.0–52.0)
Hemoglobin: 10.7 g/dL — ABNORMAL LOW (ref 13.0–17.0)
MCH: 26.2 pg (ref 26.0–34.0)
MCHC: 31.3 g/dL (ref 30.0–36.0)
MCV: 83.8 fL (ref 78.0–100.0)
Platelets: 252 10*3/uL (ref 150–400)
RBC: 4.08 MIL/uL — ABNORMAL LOW (ref 4.22–5.81)
RDW: 14.7 % (ref 11.5–15.5)
WBC: 15.2 10*3/uL — ABNORMAL HIGH (ref 4.0–10.5)

## 2011-11-23 LAB — COMPREHENSIVE METABOLIC PANEL
ALT: 141 U/L — ABNORMAL HIGH (ref 0–53)
AST: 35 U/L (ref 0–37)
Albumin: 2.5 g/dL — ABNORMAL LOW (ref 3.5–5.2)
Alkaline Phosphatase: 129 U/L — ABNORMAL HIGH (ref 39–117)
BUN: 14 mg/dL (ref 6–23)
CO2: 30 mEq/L (ref 19–32)
Calcium: 9.2 mg/dL (ref 8.4–10.5)
Chloride: 100 mEq/L (ref 96–112)
Creatinine, Ser: 0.85 mg/dL (ref 0.50–1.35)
GFR calc Af Amer: >90 mL/min (ref >90)
GFR calc non Af Amer: >90 mL/min (ref >90)
Glucose, Bld: 131 mg/dL — ABNORMAL HIGH (ref 70–99)
Potassium: 4.3 mEq/L (ref 3.5–5.1)
Sodium: 139 mEq/L (ref 135–145)
Total Bilirubin: 0.4 mg/dL (ref 0.3–1.2)
Total Protein: 6.4 g/dL (ref 6.0–8.3)

## 2011-11-23 MED ORDER — PREDNISONE 20 MG PO TABS
40.0000 mg | ORAL_TABLET | Freq: Every day | ORAL | Status: DC
  Administered 2011-11-24: 40 mg via ORAL
  Filled 2011-11-23 (×2): qty 2

## 2011-11-23 MED ORDER — ALBUTEROL SULFATE (5 MG/ML) 0.5% IN NEBU
2.5000 mg | INHALATION_SOLUTION | Freq: Four times a day (QID) | RESPIRATORY_TRACT | Status: DC
  Administered 2011-11-23 – 2011-11-24 (×3): 2.5 mg via RESPIRATORY_TRACT
  Filled 2011-11-23 (×4): qty 0.5

## 2011-11-23 NOTE — Progress Notes (Signed)
2 Days Post-Op Procedure(s) (LRB): VIDEO ASSISTED THORACOSCOPY (VATS)/DECORTICATION (Right)                     [address]            [name] [address]          [phone]      Subjective: R VATS, decortication for Empyema-- cultures negative Objective: Vital signs in last 24 hours: Temp:  [97.5 F (36.4 C)-98.3 F (36.8 C)] 98 F (36.7 C) (05/30 0737) Pulse Rate:  [68-85] 78  (05/30 0700) Cardiac Rhythm:  [-] Normal sinus rhythm (05/30 0600) Resp:  [14-23] 14  (05/30 0748) BP: (102-143)/(66-92) 106/66 mmHg (05/30 0700) SpO2:  [93 %-100 %] 97 % (05/30 0757) Arterial Line BP: (141-155)/(77-95) 144/77 mmHg (05/29 1600) Weight:  [168 lb 6.9 oz (76.4 kg)] 168 lb 6.9 oz (76.4 kg) (05/30 0500)  Hemodynamic parameters for last 24 hours:  NSR  BP stable  Intake/Output from previous day: 05/29 0701 - 05/30 0700 In: 3274.9 [P.O.:1480; I.V.:955.4; IV Piggyback:839.5] Out: 4020 [Urine:3930; Chest Tube:90] Intake/Output this shift: Total I/O In: 369.7 [P.O.:350; I.V.:7.2; IV Piggyback:12.5] Out: 150 [Urine:150]  EXAM No air leak Lungs clear  Lab Results:  Basename 11/23/11 0415 11/22/11 0400  WBC 15.2* 16.1*  HGB 10.7* 11.6*  HCT 34.2* 36.0*  PLT 252 268   BMET:  Basename 11/23/11 0415 11/22/11 0400  NA 139 137  K 4.3 3.9  CL 100 99  CO2 30 28  GLUCOSE 131* 113*  BUN 14 18  CREATININE 0.85 0.78  CALCIUM 9.2 9.1    PT/INR: No results found for this basename: LABPROT,INR in the last 72 hours ABG    Component Value Date/Time   PHART 7.447 11/22/2011 0354   HCO3 30.3* 11/22/2011 0354   TCO2 32 11/22/2011 0354   O2SAT 97.0 11/22/2011 0354   CBG (last 3)   Basename 11/23/11 0735 11/23/11 0409 11/23/11 0004  GLUCAP 101* 117* 103*    Assessment/Plan: S/P Procedure(s) (LRB): VIDEO ASSISTED THORACOSCOPY (VATS)/DECORTICATION (Right) Plan for transfer to step-down: see transfer orders DC posterior tube, cont IV VANC,Zosyn  LOS: 5 days    VAN  TRIGT III,Korrine Sicard 11/23/2011

## 2011-11-23 NOTE — Progress Notes (Signed)
Mr. Grillo' interim history and plan were discussed with Drs. Raisch and Kalia-Reynolds on rounds this AM.  He had a rough night yesterday secondary to the right sided pain.  Thoracic Surgery removed posterior tube and will be transferring him to step down today.  Antibiotics are continuing.  Will continue current supportive care and would be happy to take him back on our service if Thoracic Surgery would like to transfer his care back to [place].

## 2011-11-23 NOTE — Progress Notes (Signed)
INTERNAL MEDICINE DAILY PROGRESS NOTE  [name] [name] MRN: [id number] DOB/AGE: [id number] y.o.  Admit date: 11/18/2011  Subjective: [name] [name] states he is doing well after surgery. Now POD2. States he is having a lot of soreness but is able to move around some. Is happy to have 1 tube out. Pain well controlled on oxycodone.    Objective: Vital signs in last 24 hours: Filed Vitals:   11/23/11 1500 11/23/11 1527 11/23/11 1600 11/23/11 1657  BP: 118/82  115/74   Pulse: 74  73   Temp:  98.2 F (36.8 C)    TempSrc:  Oral    Resp: 19  18 16   Height:      Weight:      SpO2: 94%  96% 98%   Weight change: -7.1 oz (-0.2 kg)  Intake/Output Summary (Last 24 hours) at 11/23/11 1730 Last data filed at 11/23/11 1130  Gross per 24 hour  Intake 1561.33 ml  Output   3245 ml  Net -1683.67 ml   Physical Exam: Physical Exam: General: resting in chair Cardiac: RRR, no rubs, murmurs or gallops   Pulm: 1 chest tubes draining serosanguineous fluid at 20 of suction. About 450 cc of fluid in container.   Again, overall improved aeration of the lungs bilaterally. Coarse inspiratory crackles throughout right lung, trace in left.  Ext: warm and well perfused, no pedal edema, SCDs not on patient  Lab Results: Basic Metabolic Panel:  Lab 11/23/11 4098 11/22/11 0400  NA 139 137  K 4.3 3.9  CL 100 99  CO2 30 28  GLUCOSE 131* 113*  BUN 14 18  CREATININE 0.85 0.78  CALCIUM 9.2 9.1  MG -- --  PHOS -- --   Liver Function Tests:  Lab 11/23/11 0415 11/22/11 0400  AST 35 127*  ALT 141* 235*  ALKPHOS 129* 168*  BILITOT 0.4 0.4  PROT 6.4 6.6  ALBUMIN 2.5* 2.6*   CBC:  Lab 11/23/11 0415 11/22/11 0400 11/19/11 1150  WBC 15.2* 16.1* --  NEUTROABS -- 13.3* 15.2*  HGB 10.7* 11.6* --  HCT 34.2* 36.0* --  MCV 83.8 82.6 --  PLT 252 268 --   Cardiac Enzymes:  Lab 11/19/11 2329 11/19/11 1804 11/19/11 1151  CKTOTAL 12 15 13   CKMB 0.9 0.9 0.8  CKMBINDEX -- -- --  TROPONINI <0.30 <0.30  <0.30   D-Dimer:  Lab 11/18/11 1540  DDIMER 2.62*   CBG:  Lab 11/23/11 1526 11/23/11 1146 11/23/11 0735 11/23/11 0409 11/23/11 0004 11/22/11 1933  GLUCAP 99 131* 101* 117* 103* 115*   Hemoglobin A1C:  Lab 11/19/11 1413  HGBA1C 5.8*   Thyroid Function Tests:  Lab 11/20/11 0535  TSH 0.782  T4TOTAL --  FREET4 --  T3FREE --  THYROIDAB --   Coagulation:  Lab 11/19/11 1413 11/18/11 1540  LABPROT 12.6 12.5  INR 0.92 0.91   Urine Drug Screen: Drugs of Abuse     Component Value Date/Time   LABOPIA NEG 08/30/2010 1653   LABOPIA POS* 02/22/2010 0000   COCAINSCRNUR NEG 08/30/2010 1653   COCAINSCRNUR NONE DETECTED 07/24/2009 1648   LABBENZ POS* 08/30/2010 1653   LABBENZ POSITIVE* 07/24/2009 1648   AMPHETMU NEG 08/30/2010 1653   AMPHETMU NONE DETECTED 07/24/2009 1648   THCU NONE DETECTED 07/24/2009 1648   LABBARB  Value: NONE DETECTED        DRUG SCREEN FOR MEDICAL PURPOSES ONLY.  IF CONFIRMATION IS NEEDED FOR ANY PURPOSE, NOTIFY LAB WITHIN 5 DAYS.  LOWEST DETECTABLE LIMITS FOR URINE DRUG SCREEN Drug Class       Cutoff (ng/mL) Amphetamine      1000 Barbiturate      200 Benzodiazepine   200 Tricyclics       300 Opiates          300 Cocaine          300 THC              50 07/24/2009 1648    Urinalysis:  Lab 11/19/11 1740 11/18/11 1524  COLORURINE YELLOW YELLOW  LABSPEC 1.013 1.017  PHURINE 7.0 6.0  GLUCOSEU NEGATIVE NEGATIVE  HGBUR NEGATIVE NEGATIVE  BILIRUBINUR NEGATIVE NEGATIVE  KETONESUR NEGATIVE NEGATIVE  PROTEINUR NEGATIVE NEGATIVE  UROBILINOGEN 0.2 0.2  NITRITE NEGATIVE NEGATIVE  LEUKOCYTESUR NEGATIVE NEGATIVE     Micro Results: Recent Results (from the past 240 hour(s))  AFB CULTURE WITH SMEAR     Status: Normal (Preliminary result)   Collection Time   11/18/11  9:48 AM      Component Value Range Status Comment   Specimen Description PLEURAL   Final    Special Requests NONE   Final    ACID FAST SMEAR NO ACID FAST BACILLI SEEN   Final    Culture     Final     Value: CULTURE WILL BE EXAMINED FOR 6 WEEKS BEFORE ISSUING A FINAL REPORT   Report Status PENDING   Incomplete   ANAEROBIC CULTURE     Status: Normal (Preliminary result)   Collection Time   11/19/11  9:44 AM      Component Value Range Status Comment   Specimen Description PLEURAL RIGHT FLUID   Final    Special Requests NONE   Final    Gram Stain     Final    Value: RARE WBC PRESENT,BOTH PMN AND MONONUCLEAR     NO ORGANISMS SEEN   Culture     Final    Value: NO ANAEROBES ISOLATED; CULTURE IN PROGRESS FOR 5 DAYS   Report Status PENDING   Incomplete   BODY FLUID CULTURE     Status: Normal   Collection Time   11/19/11  9:48 AM      Component Value Range Status Comment   Specimen Description PLEURAL RIGHT FLUID   Final    Special Requests NONE   Final    Gram Stain     Final    Value: RARE WBC PRESENT,BOTH PMN AND MONONUCLEAR     NO ORGANISMS SEEN   Culture NO GROWTH 3 DAYS   Final    Report Status 11/22/2011 FINAL   Final   FUNGAL STAIN     Status: Normal   Collection Time   11/19/11  9:53 AM      Component Value Range Status Comment   Specimen Description PLEURAL RIGHT FLUID   Final    Special Requests NONE   Final    Fungal Smear NO YEAST OR FUNGAL ELEMENTS SEEN   Final    Report Status 11/20/2011 FINAL   Final   SURGICAL PCR SCREEN     Status: Normal   Collection Time   11/19/11  2:16 PM      Component Value Range Status Comment   MRSA, PCR NEGATIVE  NEGATIVE  Final    Staphylococcus aureus NEGATIVE  NEGATIVE  Final   BODY FLUID CULTURE     Status: Normal (Preliminary result)   Collection Time   11/21/11  3:58 PM  Component Value Range Status Comment   Specimen Description PLEURAL FLUID RIGHT   Final    Special Requests SWAB RECIEVED   Final    Gram Stain     Final    Value: RARE WBC PRESENT,BOTH PMN AND MONONUCLEAR     NO ORGANISMS SEEN   Culture NO GROWTH 1 DAY   Final    Report Status PENDING   Incomplete   ANAEROBIC CULTURE     Status: Normal (Preliminary result)     Collection Time   11/21/11  3:58 PM      Component Value Range Status Comment   Specimen Description PLEURAL FLUID RIGHT   Final    Special Requests SWAB RECIEVED   Final    Gram Stain PENDING   Incomplete    Culture     Final    Value: NO ANAEROBES ISOLATED; CULTURE IN PROGRESS FOR 5 DAYS   Report Status PENDING   Incomplete   TISSUE CULTURE     Status: Normal (Preliminary result)   Collection Time   11/21/11  4:06 PM      Component Value Range Status Comment   Specimen Description TISSUE   Final    Special Requests PLEURAL PEEL   Final    Gram Stain     Final    Value: FEW WBC PRESENT,BOTH PMN AND MONONUCLEAR     RARE SQUAMOUS EPITHELIAL CELLS PRESENT     NO ORGANISMS SEEN   Culture NO GROWTH 1 DAY   Final    Report Status PENDING   Incomplete   ANAEROBIC CULTURE     Status: Normal (Preliminary result)   Collection Time   11/21/11  4:06 PM      Component Value Range Status Comment   Specimen Description TISSUE   Final    Special Requests PLEURAL PEEL   Final    Gram Stain PENDING   Incomplete    Culture     Final    Value: NO ANAEROBES ISOLATED; CULTURE IN PROGRESS FOR 5 DAYS   Report Status PENDING   Incomplete   AFB CULTURE WITH SMEAR     Status: Normal (Preliminary result)   Collection Time   11/21/11  4:06 PM      Component Value Range Status Comment   Specimen Description TISSUE   Final    Special Requests PLEURAL PEEL   Final    ACID FAST SMEAR NO ACID FAST BACILLI SEEN   Final    Culture     Final    Value: CULTURE WILL BE EXAMINED FOR 6 WEEKS BEFORE ISSUING A FINAL REPORT   Report Status PENDING   Incomplete    Studies/Results: Dg Chest Port 1 View  11/23/2011  *RADIOLOGY REPORT*  Clinical Data: Status post VATS.  Chest tubes remain  PORTABLE CHEST - 1 VIEW  Comparison: 11/22/2011  Findings: A right subclavian CVP and two right-sided chest tubes are stable in position.  No residual right pneumothorax is seen. Heart and mediastinal contours are unchanged with mild  ectasia of the thoracic aorta. Persistent bibasilar volume loss is noted.  No new areas of focal atelectasis or infiltrate are seen.  A small amount of residual subcutaneous emphysema in the tissues of the right chest wall and stable right-sided pleural reaction are apparent.  IMPRESSION: Stable cardiopulmonary appearance with unchanged support tubes and bibasilar volume loss  Original Report Authenticated By: Bertha Stakes, M.D.   Dg Chest Port 1 View  11/22/2011  *RADIOLOGY REPORT*  Clinical Data: Video assisted thoracoscopic surgery  PORTABLE CHEST - 1 VIEW  Comparison: Yesterday  Findings: Lungs remain under aerated   and basilar atelectasis is stable.  Endotracheal tube removed.  Right subclavian central venous catheter and right chest tubes stable.  Pneumothorax has resolved.  No vascularity.  IMPRESSION: Resolved pneumothorax.  Extubated.  Stable basilar atelectasis.  Original Report Authenticated By: Donavan Burnet, M.D.   Medications: I have reviewed the patient's current medications. Scheduled Meds:    . albuterol  2.5 mg Nebulization Q6H  . bisacodyl  10 mg Oral Daily  . docusate sodium  100 mg Oral BID  . fentaNYL   Intravenous Q4H  . furosemide  20 mg Intravenous Once  . hydrocortisone sod succinate (SOLU-CORTEF) injection  25 mg Intravenous Q8H  . hydrocortisone sod succinate (SOLU-CORTEF) injection  50 mg Intravenous Once  . insulin aspart  0-24 Units Subcutaneous Q4H  . mulitivitamin with minerals  1 tablet Oral Daily  . nebivolol  5 mg Oral Daily  . nicotine  21 mg Transdermal Daily  . piperacillin-tazobactam (ZOSYN)  IV  3.375 g Intravenous Q8H  . sodium chloride  3 mL Intravenous Q12H  . vancomycin  1,000 mg Intravenous Q8H  . DISCONTD: albuterol  2.5 mg Nebulization Q4H WA   Continuous Infusions:    . bupivacaine ON-Q pain pump    . dextrose 5 % and 0.45 % NaCl with KCl 10 mEq/L 20 mL/hr at 11/23/11 0700   PRN Meds:.sodium chloride, alum & mag hydroxide-simeth,  diphenhydrAMINE, diphenhydrAMINE, ibuprofen, ketorolac, naloxone, ondansetron (ZOFRAN) IV, ondansetron (ZOFRAN) IV, oxyCODONE, oxyCODONE, polyethylene glycol, potassium chloride, senna-docusate, sodium chloride, sodium chloride, traZODone Assessment/Plan:    1) Empyema - Admitted for pain and pleural effusion. CT showed moderate, possibly loculated pleural effusion. 11/19/2011 IR did diagnostic and therapeutic thoracentesis which unfortunately showed that the effusion is indeed loculated and was only incompletely drained. Thoracic surgery consult obtained to evaluate for surgical evacuation and will perform decortication tomorrow. Vancomycin and Zosyn started 11/19/2011. Pleural LDH 1051 and a serum LDH 322. Clearly this is an exudative effusion/empyema with many WBCs (6140, predominantly neutrophils). Pleural fluid no growth to date. Gram stain, AFB and fungal smears negative.  Postop day 2 from right vats and decortication, healing well. -- Pain control with bupivicane pump and oxycodone IR.   -- Vancomycin and Zosyn day 6 -- Will plan to discharge patient on Augmentin  2) rheumatoid arthritis - stable.Now getting stress dose hydrocortisone prior to surgery.  Received 50 mg IV once preop followed by 25 mg TID x 3 days (now day 3).  After this, we will continue the patient at his current dose of 40 mg prednisone for possible acute flare (2/2 left shoulder bursitis and chronic low back pain) and taper.   -- hydrocortisone burst day 3/3 -- prednisone 40 mg to begin tomorrow AM  3) hypertension - blood pressure elevated overnight, otherwise well controlled on home regimen.  -- Nebivolol 5 mg by mouth daily    4) history of SLE - unlikely to be SLE given negative workup and men with SLE  generally do very poorly and he is currently not on therapy besides prednisone. We will continue his current therapy.    5) insomnia - stable, patient will continue to use trazodone  DVT prophylaxis -SCDs prior to  surgery  Disposition: Pending recovery from surgical evaluation/correction for loculated parapneumonic effusion/empyema.   LOS: 4 days  Jeffie Spivack       PGY-I, Internal  Medicine Resident  Pager: [phone] (7AM-5PM) 5:30 PM

## 2011-11-23 NOTE — Care Management Note (Unsigned)
    Page 1 of 1   11/23/2011     2:16:12 PM   CARE MANAGEMENT NOTE 11/23/2011  Patient:  [name] [name],[name] [name]   Account Number:  [id number]  Date Initiated:  11/23/2011  Documentation initiated by:  Janelli Welling  Subjective/Objective Assessment:   PT S/P RT VATS ON 11/21/11.  PTA, PT INDEPENDENT, LIVES AT HOME WITH MOTHER.     Action/Plan:   MET WITH PT TO DISCUSS DC PLANS.  PT STATES MOTHER TO PROVIDE CARE AT DISCHARGE.  WILL FOLLOW FOR HOME NEEDS AS PT PROGRESSES.   Anticipated DC Date:  11/27/2011   Anticipated DC Plan:  HOME W HOME HEALTH SERVICES      DC Planning Services  CM consult      Choice offered to / List presented to:             Status of service:  In process, will continue to follow Medicare Important Message given?   (If response is "NO", the following Medicare IM given date fields will be blank) Date Medicare IM given:   Date Additional Medicare IM given:    Discharge Disposition:    Per UR Regulation:  Reviewed for med. necessity/level of care/duration of stay  If discussed at Long Length of Stay Meetings, dates discussed:    Comments:

## 2011-11-24 ENCOUNTER — Inpatient Hospital Stay (HOSPITAL_COMMUNITY): Payer: Medicare HMO

## 2011-11-24 LAB — COMPREHENSIVE METABOLIC PANEL
ALT: 94 U/L — ABNORMAL HIGH (ref 0–53)
AST: 39 U/L — ABNORMAL HIGH (ref 0–37)
Albumin: 2.6 g/dL — ABNORMAL LOW (ref 3.5–5.2)
Alkaline Phosphatase: 116 U/L (ref 39–117)
BUN: 17 mg/dL (ref 6–23)
CO2: 30 mEq/L (ref 19–32)
Calcium: 9.3 mg/dL (ref 8.4–10.5)
Chloride: 99 mEq/L (ref 96–112)
Creatinine, Ser: 1.07 mg/dL (ref 0.50–1.35)
GFR calc Af Amer: >90 mL/min (ref >90)
GFR calc non Af Amer: 79 mL/min — ABNORMAL LOW (ref >90)
Glucose, Bld: 136 mg/dL — ABNORMAL HIGH (ref 70–99)
Potassium: 4.4 mEq/L (ref 3.5–5.1)
Sodium: 138 mEq/L (ref 135–145)
Total Bilirubin: 0.4 mg/dL (ref 0.3–1.2)
Total Protein: 6.6 g/dL (ref 6.0–8.3)

## 2011-11-24 LAB — GLUCOSE, CAPILLARY
Glucose-Capillary: 100 mg/dL — ABNORMAL HIGH (ref 70–99)
Glucose-Capillary: 114 mg/dL — ABNORMAL HIGH (ref 70–99)
Glucose-Capillary: 126 mg/dL — ABNORMAL HIGH (ref 70–99)
Glucose-Capillary: 163 mg/dL — ABNORMAL HIGH (ref 70–99)

## 2011-11-24 LAB — CBC
HCT: 33.3 % — ABNORMAL LOW (ref 39.0–52.0)
Hemoglobin: 10.6 g/dL — ABNORMAL LOW (ref 13.0–17.0)
MCH: 26.8 pg (ref 26.0–34.0)
MCHC: 31.8 g/dL (ref 30.0–36.0)
MCV: 84.1 fL (ref 78.0–100.0)
Platelets: 223 10*3/uL (ref 150–400)
RBC: 3.96 MIL/uL — ABNORMAL LOW (ref 4.22–5.81)
RDW: 14.9 % (ref 11.5–15.5)
WBC: 13.7 10*3/uL — ABNORMAL HIGH (ref 4.0–10.5)

## 2011-11-24 LAB — ANAEROBIC CULTURE

## 2011-11-24 LAB — VANCOMYCIN, TROUGH: Vancomycin Tr: 23 ug/mL — ABNORMAL HIGH (ref 10.0–20.0)

## 2011-11-24 MED ORDER — ALBUTEROL SULFATE (5 MG/ML) 0.5% IN NEBU
2.5000 mg | INHALATION_SOLUTION | Freq: Two times a day (BID) | RESPIRATORY_TRACT | Status: DC
  Filled 2011-11-24 (×2): qty 0.5

## 2011-11-24 MED ORDER — VANCOMYCIN HCL IN DEXTROSE 1-5 GM/200ML-% IV SOLN
1000.0000 mg | Freq: Two times a day (BID) | INTRAVENOUS | Status: DC
  Administered 2011-11-25 (×3): 1000 mg via INTRAVENOUS
  Filled 2011-11-24 (×4): qty 200

## 2011-11-24 MED ORDER — ALBUTEROL SULFATE (5 MG/ML) 0.5% IN NEBU: 2.5000 mg | INHALATION_SOLUTION | RESPIRATORY_TRACT | Status: DC | PRN

## 2011-11-24 MED ORDER — PREDNISONE 10 MG PO TABS
10.0000 mg | ORAL_TABLET | Freq: Every day | ORAL | Status: DC
  Filled 2011-11-24: qty 1

## 2011-11-24 MED ORDER — PREDNISONE 5 MG PO TABS: 5.0000 mg | ORAL_TABLET | Freq: Every day | ORAL | Status: DC

## 2011-11-24 MED ORDER — PREDNISONE 20 MG PO TABS
30.0000 mg | ORAL_TABLET | Freq: Every day | ORAL | Status: AC
  Administered 2011-11-25: 30 mg via ORAL
  Filled 2011-11-24: qty 1

## 2011-11-24 MED ORDER — PREDNISONE 10 MG PO TABS: ORAL_TABLET | ORAL | Status: DC

## 2011-11-24 MED ORDER — ENOXAPARIN SODIUM 40 MG/0.4ML ~~LOC~~ SOLN
40.0000 mg | SUBCUTANEOUS | Status: DC
  Administered 2011-11-24 – 2011-11-25 (×2): 40 mg via SUBCUTANEOUS
  Filled 2011-11-24 (×3): qty 0.4

## 2011-11-24 MED ORDER — PREDNISONE 20 MG PO TABS
20.0000 mg | ORAL_TABLET | Freq: Every day | ORAL | Status: AC
  Administered 2011-11-26: 20 mg via ORAL
  Filled 2011-11-24: qty 1

## 2011-11-24 NOTE — Progress Notes (Signed)
30cc fentanyl wasted in sink upon PCA discontinuation. Verified and witnessed by Donalee Citrin, RN Carey Bullocks, Jorge Ny

## 2011-11-24 NOTE — Progress Notes (Signed)
ANTIBIOTIC CONSULT NOTE - INITIAL  Pharmacy Consult for Vancomycin Indication: Pneumonia, s/p VATS  Allergies  Allergen Reactions  . Ramipril (Ramipril) Rash  . Tramadol Itching and Rash    Patient Measurements: Height: 5\' 7"  (170.2 cm) Weight: 167 lb 12.3 oz (76.1 kg) IBW/kg (Calculated) : 66.1  Adjusted Body Weight:   Vital Signs: Temp: 98.1 F (36.7 C) (05/31 1137) Temp src: Oral (05/31 1137) BP: 114/81 mmHg (05/31 1137) Pulse Rate: 82  (05/31 1137) Intake/Output from previous day: 05/30 0701 - 05/31 0700 In: 1794.7 [P.O.:650; I.V.:394.7; IV Piggyback:750] Out: 2115 [Urine:2025; Chest Tube:90] Intake/Output from this shift: Total I/O In: 52.5 [I.V.:40; IV Piggyback:12.5] Out: 625 [Urine:600; Chest Tube:25]  Labs:  [facility] 11/24/11 0404 11/23/11 0415 11/22/11 0400  WBC 13.7* 15.2* 16.1*  HGB 10.6* 10.7* 11.6*  PLT 223 252 268  LABCREA -- -- --  CREATININE 1.07 0.85 0.78   Estimated Creatinine Clearance: 76.4 ml/min (by C-G formula based on Cr of 1.07).  Basename 11/24/11 1109  VANCOTROUGH 23.0*  VANCOPEAK --  Drue Dun --  GENTTROUGH --  GENTPEAK --  GENTRANDOM --  TOBRATROUGH --  TOBRAPEAK --  TOBRARND --  AMIKACINPEAK --  AMIKACINTROU --  AMIKACIN --     Microbiology: Recent Results (from the past 720 hour(s))  CULTURE, BLOOD (ROUTINE X 2)     Status: Normal   Collection Time   11/07/11  7:54 AM      Component Value Range Status Comment   Specimen Description BLOOD RIGHT ARM   Final    Special Requests BOTTLES DRAWN AEROBIC AND ANAEROBIC 10CC   Final    Culture  Setup Time [id number]   Final    Culture NO GROWTH 5 DAYS   Final    Report Status 11/13/2011 FINAL   Final   CULTURE, BLOOD (ROUTINE X 2)     Status: Normal   Collection Time   11/07/11  7:57 AM      Component Value Range Status Comment   Specimen Description BLOOD LEFT HAND   Final    Special Requests BOTTLES DRAWN AEROBIC AND ANAEROBIC 10CC   Final    Culture  Setup Time  [id number]   Final    Culture NO GROWTH 5 DAYS   Final    Report Status 11/13/2011 FINAL   Final   CULTURE, EXPECTORATED SPUTUM-ASSESSMENT     Status: Normal   Collection Time   11/08/11  1:07 PM      Component Value Range Status Comment   Specimen Description SPUTUM   Final    Special Requests NONE   Final    Sputum evaluation     Final    Value: THIS SPECIMEN IS ACCEPTABLE. RESPIRATORY CULTURE REPORT TO FOLLOW.   Report Status 11/08/2011 FINAL   Final   CULTURE, RESPIRATORY     Status: Normal   Collection Time   11/08/11  1:07 PM      Component Value Range Status Comment   Specimen Description SPUTUM   Final    Special Requests NONE   Final    Gram Stain     Final    Value: ABUNDANT WBC PRESENT,BOTH PMN AND MONONUCLEAR     FEW GRAM POSITIVE COCCI IN PAIRS     IN CLUSTERS RARE GRAM NEGATIVE RODS     RARE GRAM POSITIVE RODS     FEW SQUAMOUS EPITHELIAL CELLS PRESENT   Culture NORMAL OROPHARYNGEAL FLORA   Final    Report Status 11/10/2011 FINAL  Final   GRAM STAIN     Status: Normal   Collection Time   11/08/11  1:08 PM      Component Value Range Status Comment   Specimen Description SPUTUM   Final    Special Requests NONE   Final    Gram Stain     Final    Value: ABUNDANT WBC PRESENT,BOTH PMN AND MONONUCLEAR     FEW GRAM POSITIVE COCCI IN PAIRS IN CLUSTERS     RARE GRAM NEGATIVE RODS     RARE GRAM POSITIVE RODS     FEW SQUAMOUS EPITHELIAL CELLS PRESENT   Report Status 11/08/2011 FINAL   Final   AFB CULTURE WITH SMEAR     Status: Normal (Preliminary result)   Collection Time   11/18/11  9:48 AM      Component Value Range Status Comment   Specimen Description PLEURAL   Final    Special Requests NONE   Final    ACID FAST SMEAR NO ACID FAST BACILLI SEEN   Final    Culture     Final    Value: CULTURE WILL BE EXAMINED FOR 6 WEEKS BEFORE ISSUING A FINAL REPORT   Report Status PENDING   Incomplete   ANAEROBIC CULTURE     Status: Normal   Collection Time   11/19/11  9:44 AM       Component Value Range Status Comment   Specimen Description PLEURAL RIGHT FLUID   Final    Special Requests NONE   Final    Gram Stain     Final    Value: RARE WBC PRESENT,BOTH PMN AND MONONUCLEAR     NO ORGANISMS SEEN   Culture NO ANAEROBES ISOLATED   Final    Report Status 11/24/2011 FINAL   Final   BODY FLUID CULTURE     Status: Normal   Collection Time   11/19/11  9:48 AM      Component Value Range Status Comment   Specimen Description PLEURAL RIGHT FLUID   Final    Special Requests NONE   Final    Gram Stain     Final    Value: RARE WBC PRESENT,BOTH PMN AND MONONUCLEAR     NO ORGANISMS SEEN   Culture NO GROWTH 3 DAYS   Final    Report Status 11/22/2011 FINAL   Final   FUNGAL STAIN     Status: Normal   Collection Time   11/19/11  9:53 AM      Component Value Range Status Comment   Specimen Description PLEURAL RIGHT FLUID   Final    Special Requests NONE   Final    Fungal Smear NO YEAST OR FUNGAL ELEMENTS SEEN   Final    Report Status 11/20/2011 FINAL   Final   SURGICAL PCR SCREEN     Status: Normal   Collection Time   11/19/11  2:16 PM      Component Value Range Status Comment   MRSA, PCR NEGATIVE  NEGATIVE  Final    Staphylococcus aureus NEGATIVE  NEGATIVE  Final   BODY FLUID CULTURE     Status: Normal (Preliminary result)   Collection Time   11/21/11  3:58 PM      Component Value Range Status Comment   Specimen Description PLEURAL FLUID RIGHT   Final    Special Requests SWAB RECIEVED   Final    Gram Stain     Final    Value: RARE WBC PRESENT,BOTH PMN AND  MONONUCLEAR     NO ORGANISMS SEEN   Culture NO GROWTH 2 DAYS   Final    Report Status PENDING   Incomplete   ANAEROBIC CULTURE     Status: Normal (Preliminary result)   Collection Time   11/21/11  3:58 PM      Component Value Range Status Comment   Specimen Description PLEURAL FLUID RIGHT   Final    Special Requests SWAB RECIEVED   Final    Gram Stain PENDING   Incomplete    Culture     Final    Value: NO  ANAEROBES ISOLATED; CULTURE IN PROGRESS FOR 5 DAYS   Report Status PENDING   Incomplete   TISSUE CULTURE     Status: Normal (Preliminary result)   Collection Time   11/21/11  4:06 PM      Component Value Range Status Comment   Specimen Description TISSUE   Final    Special Requests PLEURAL PEEL   Final    Gram Stain     Final    Value: FEW WBC PRESENT,BOTH PMN AND MONONUCLEAR     RARE SQUAMOUS EPITHELIAL CELLS PRESENT     NO ORGANISMS SEEN   Culture NO GROWTH 2 DAYS   Final    Report Status PENDING   Incomplete   ANAEROBIC CULTURE     Status: Normal (Preliminary result)   Collection Time   11/21/11  4:06 PM      Component Value Range Status Comment   Specimen Description TISSUE   Final    Special Requests PLEURAL PEEL   Final    Gram Stain PENDING   Incomplete    Culture     Final    Value: NO ANAEROBES ISOLATED; CULTURE IN PROGRESS FOR 5 DAYS   Report Status PENDING   Incomplete   AFB CULTURE WITH SMEAR     Status: Normal (Preliminary result)   Collection Time   11/21/11  4:06 PM      Component Value Range Status Comment   Specimen Description TISSUE   Final    Special Requests PLEURAL PEEL   Final    ACID FAST SMEAR NO ACID FAST BACILLI SEEN   Final    Culture     Final    Value: CULTURE WILL BE EXAMINED FOR 6 WEEKS BEFORE ISSUING A FINAL REPORT   Report Status PENDING   Incomplete     Medical History: Past Medical History  Diagnosis Date  . HLD (hyperlipidemia)   . Lupus     Reported to be dx in 1998 but markers were repeated 2012 Dr Nickola Major and were noraml. This is likely an innacruate dx carried forward by the bloody EMR.   . RA (rheumatoid arthritis)   . Hypertension   . Heart murmur   . Pneumonia 10/2011    Medications:  Scheduled:    . albuterol  2.5 mg Nebulization Q6H  . bisacodyl  10 mg Oral Daily  . docusate sodium  100 mg Oral BID  . enoxaparin  40 mg Subcutaneous Q24H  . hydrocortisone sod succinate (SOLU-CORTEF) injection  25 mg Intravenous Q8H  .  insulin aspart  0-24 Units Subcutaneous Q4H  . mulitivitamin with minerals  1 tablet Oral Daily  . nebivolol  5 mg Oral Daily  . nicotine  21 mg Transdermal Daily  . piperacillin-tazobactam (ZOSYN)  IV  3.375 g Intravenous Q8H  . predniSONE  30 mg Oral Q breakfast   Followed by  . predniSONE  20 mg Oral Q breakfast   Followed by  . predniSONE  10 mg Oral Q breakfast   Followed by  . predniSONE  5 mg Oral Q breakfast  . sodium chloride  3 mL Intravenous Q12H  . vancomycin  1,000 mg Intravenous Q12H  . DISCONTD: albuterol  2.5 mg Nebulization Q4H WA  . DISCONTD: fentaNYL   Intravenous Q4H  . DISCONTD: hydrocortisone sod succinate (SOLU-CORTEF) injection  50 mg Intravenous Once  . DISCONTD: predniSONE  40 mg Oral Q breakfast  . DISCONTD: vancomycin  1,000 mg Intravenous Q8H   Assessment: 51yo male on Vancomyin for pneumonia, s/p VATS.  Vancomycin trough this AM just over goal at 23, on 1000mg  IV q8.  Cr is up to 1.07, with uop 2175 yesterday.  WBC cont to decr to 13.7 this AM.  All cultures are NTD.  Goal of Therapy:  Vancomycin trough level 15-20 mcg/ml  Plan:  1.  Decrease Vancomycin to 1000mg  IV q12, next dose at MN 2.  F/U cx  Marisue Humble, PharmD Clinical Pharmacist [facility]

## 2011-11-24 NOTE — Progress Notes (Signed)
3 Days Post-Op Procedure(s) (LRB): VIDEO ASSISTED THORACOSCOPY (VATS)/DECORTICATION (Right) Subjective:                      [address]            [name] [name] [id number]          [phone]     S/P R VATS for empyema Hx chronic prednisone for lupus Chest tubes out OR cultures neg on Vanc-Zosyn  Objective: Vital signs in last 24 hours: Temp:  [97.9 F (36.6 C)-99.8 F (37.7 C)] 98.1 F (36.7 C) (05/31 0729) Pulse Rate:  [68-89] 75  (05/31 0800) Cardiac Rhythm:  [-] Normal sinus rhythm (05/31 0800) Resp:  [12-23] 18  (05/31 0800) BP: (111-147)/(74-115) 126/81 mmHg (05/31 0800) SpO2:  [94 %-99 %] 97 % (05/31 0800) Weight:  [167 lb 12.3 oz (76.1 kg)] 167 lb 12.3 oz (76.1 kg) (05/31 0400)  Hemodynamic parameters for last 24 hours:  stable  Intake/Output from previous day: 05/30 0701 - 05/31 0700 In: 1794.7 [P.O.:650; I.V.:394.7; IV Piggyback:750] Out: 2115 [Urine:2025; Chest Tube:90] Intake/Output this shift: Total I/O In: 12.5 [IV Piggyback:12.5] Out: 325 [Urine:300; Chest Tube:25]  Lungs clear No air leak NSR  Lab Results:  Basename 11/24/11 0404 11/23/11 0415  WBC 13.7* 15.2*  HGB 10.6* 10.7*  HCT 33.3* 34.2*  PLT 223 252   BMET:  Basename 11/24/11 0404 11/23/11 0415  NA 138 139  K 4.4 4.3  CL 99 100  CO2 30 30  GLUCOSE 136* 131*  BUN 17 14  CREATININE 1.07 0.85  CALCIUM 9.3 9.2    PT/INR: No results found for this basename: LABPROT,INR in the last 72 hours ABG    Component Value Date/Time   PHART 7.447 11/22/2011 0354   HCO3 30.3* 11/22/2011 0354   TCO2 32 11/22/2011 0354   O2SAT 97.0 11/22/2011 0354   CBG (last 3)   Basename 11/24/11 0732 11/24/11 0356 11/23/11 2343  GLUCAP 111* 114* 100*    Assessment/Plan: S/P Procedure(s) (LRB): VIDEO ASSISTED THORACOSCOPY (VATS)/DECORTICATION (Right) Transfer to 2000 Cont IV Abs until DC then oral Augmentin,doxycycline   LOS: 6 days    [name] [name],[name] [name] 11/24/2011

## 2011-11-24 NOTE — Progress Notes (Signed)
Pt t/x to unit 2000 on monitor without event. Pt ambulated from our unit to 2000, which is approx 900 ft. Pt's mother made aware of room change. Shanda Bumps RN received pt and was present for telemetry hookup.  Felipa Emory

## 2011-11-24 NOTE — Progress Notes (Signed)
Internal Medicine Attending  Date: 11/24/2011  Patient name: [name] [name] Medical record number: [id number] Date of birth: 1960-09-29 Age: 51 y.o. Gender: male  I saw and evaluated the patient. I reviewed the resident's note by Dr. Candy Sledge and I agree with the resident's findings and plans as documented in his progress note.  [name] was seen on rounds this AM.  Right sided pain 4/10.  Last chest tube removed.  He has been walking the halls.  Agree with Dr. Gardiner Sleeper recommendations on rapid prednisone taper to baseline dose.  We are signing off of the case at this time.  Please let us know if we can be further assistance.  He has already been scheduled for outpatient follow-up in early June in our clinic.  Thank You.

## 2011-11-24 NOTE — Progress Notes (Addendum)
INTERNAL MEDICINE DAILY PROGRESS NOTE  [name] [name] MRN: [id number] DOB/AGE: 52-Aug-1962 52 y.o.  Admit date: 11/18/2011  Subjective: [name] [name] continues to do very well after surgery. He has had his last chest tube removed today. He still feels very sore on the right side, but was able to ambulate from 3200 to 2000 without a problem.  Objective: Vital signs in last 24 hours: Filed Vitals:   11/24/11 0500 11/24/11 0600 11/24/11 0700 11/24/11 0729  BP:      Pulse: 71 68 69   Temp:    98.1 F (36.7 C)  TempSrc:    Oral  Resp: 15 13 12    Height:      Weight:      SpO2: 96% 94% 97%    Weight change: -10.6 oz (-0.3 kg)  Intake/Output Summary (Last 24 hours) at 11/24/11 0759 Last data filed at 11/24/11 0700  Gross per 24 hour  Intake 1787.5 ml  Output   2115 ml  Net -327.5 ml   Physical Exam: Physical Exam: General: resting in chair Cardiac: RRR, no rubs, murmurs or gallops   Pulm: Again, overall improved aeration of the lungs bilaterally. Coarse inspiratory crackles throughout right lung, trace in left.  Ext: warm and well perfused, no pedal edema, SCDs not on patient  Lab Results: Basic Metabolic Panel:  Lab 11/24/11 2956 11/23/11 0415  NA 138 139  K 4.4 4.3  CL 99 100  CO2 30 30  GLUCOSE 136* 131*  BUN 17 14  CREATININE 1.07 0.85  CALCIUM 9.3 9.2  MG -- --  PHOS -- --   Liver Function Tests:  Lab 11/24/11 0404 11/23/11 0415  AST 39* 35  ALT 94* 141*  ALKPHOS 116 129*  BILITOT 0.4 0.4  PROT 6.6 6.4  ALBUMIN 2.6* 2.5*   CBC:  Lab 11/24/11 0404 11/23/11 0415 11/22/11 0400 11/19/11 1150  WBC 13.7* 15.2* -- --  NEUTROABS -- -- 13.3* 15.2*  HGB 10.6* 10.7* -- --  HCT 33.3* 34.2* -- --  MCV 84.1 83.8 -- --  PLT 223 252 -- --   Cardiac Enzymes:  Lab 11/19/11 2329 11/19/11 1804 11/19/11 1151  CKTOTAL 12 15 13   CKMB 0.9 0.9 0.8  CKMBINDEX -- -- --  TROPONINI <0.30 <0.30 <0.30   D-Dimer:  Lab 11/18/11 1540  DDIMER 2.62*   CBG:  Lab  11/24/11 0356 11/23/11 2343 11/23/11 1938 11/23/11 1526 11/23/11 1146 11/23/11 0735  GLUCAP 114* 100* 130* 99 131* 101*   Hemoglobin A1C:  Lab 11/19/11 1413  HGBA1C 5.8*   Thyroid Function Tests:  Lab 11/20/11 0535  TSH 0.782  T4TOTAL --  FREET4 --  T3FREE --  THYROIDAB --   Coagulation:  Lab 11/19/11 1413 11/18/11 1540  LABPROT 12.6 12.5  INR 0.92 0.91   Urine Drug Screen: Drugs of Abuse     Component Value Date/Time   LABOPIA NEG 08/30/2010 1653   LABOPIA POS* 02/22/2010 0000   COCAINSCRNUR NEG 08/30/2010 1653   COCAINSCRNUR NONE DETECTED 07/24/2009 1648   LABBENZ POS* 08/30/2010 1653   LABBENZ POSITIVE* 07/24/2009 1648   AMPHETMU NEG 08/30/2010 1653   AMPHETMU NONE DETECTED 07/24/2009 1648   THCU NONE DETECTED 07/24/2009 1648   LABBARB  Value: NONE DETECTED        DRUG SCREEN FOR MEDICAL PURPOSES ONLY.  IF CONFIRMATION IS NEEDED FOR ANY PURPOSE, NOTIFY LAB WITHIN 5 DAYS.        LOWEST DETECTABLE LIMITS FOR URINE DRUG SCREEN Drug Class  Cutoff (ng/mL) Amphetamine      1000 Barbiturate      200 Benzodiazepine   200 Tricyclics       300 Opiates          300 Cocaine          300 THC              50 07/24/2009 1648    Urinalysis:  Lab 11/19/11 1740 11/18/11 1524  COLORURINE YELLOW YELLOW  LABSPEC 1.013 1.017  PHURINE 7.0 6.0  GLUCOSEU NEGATIVE NEGATIVE  HGBUR NEGATIVE NEGATIVE  BILIRUBINUR NEGATIVE NEGATIVE  KETONESUR NEGATIVE NEGATIVE  PROTEINUR NEGATIVE NEGATIVE  UROBILINOGEN 0.2 0.2  NITRITE NEGATIVE NEGATIVE  LEUKOCYTESUR NEGATIVE NEGATIVE     Micro Results: Recent Results (from the past 240 hour(s))  AFB CULTURE WITH SMEAR     Status: Normal (Preliminary result)   Collection Time   11/18/11  9:48 AM      Component Value Range Status Comment   Specimen Description PLEURAL   Final    Special Requests NONE   Final    ACID FAST SMEAR NO ACID FAST BACILLI SEEN   Final    Culture     Final    Value: CULTURE WILL BE EXAMINED FOR 6 WEEKS BEFORE ISSUING A FINAL REPORT    Report Status PENDING   Incomplete   ANAEROBIC CULTURE     Status: Normal (Preliminary result)   Collection Time   11/19/11  9:44 AM      Component Value Range Status Comment   Specimen Description PLEURAL RIGHT FLUID   Final    Special Requests NONE   Final    Gram Stain     Final    Value: RARE WBC PRESENT,BOTH PMN AND MONONUCLEAR     NO ORGANISMS SEEN   Culture     Final    Value: NO ANAEROBES ISOLATED; CULTURE IN PROGRESS FOR 5 DAYS   Report Status PENDING   Incomplete   BODY FLUID CULTURE     Status: Normal   Collection Time   11/19/11  9:48 AM      Component Value Range Status Comment   Specimen Description PLEURAL RIGHT FLUID   Final    Special Requests NONE   Final    Gram Stain     Final    Value: RARE WBC PRESENT,BOTH PMN AND MONONUCLEAR     NO ORGANISMS SEEN   Culture NO GROWTH 3 DAYS   Final    Report Status 11/22/2011 FINAL   Final   FUNGAL STAIN     Status: Normal   Collection Time   11/19/11  9:53 AM      Component Value Range Status Comment   Specimen Description PLEURAL RIGHT FLUID   Final    Special Requests NONE   Final    Fungal Smear NO YEAST OR FUNGAL ELEMENTS SEEN   Final    Report Status 11/20/2011 FINAL   Final   SURGICAL PCR SCREEN     Status: Normal   Collection Time   11/19/11  2:16 PM      Component Value Range Status Comment   MRSA, PCR NEGATIVE  NEGATIVE  Final    Staphylococcus aureus NEGATIVE  NEGATIVE  Final   BODY FLUID CULTURE     Status: Normal (Preliminary result)   Collection Time   11/21/11  3:58 PM      Component Value Range Status Comment   Specimen Description PLEURAL FLUID RIGHT  Final    Special Requests SWAB RECIEVED   Final    Gram Stain     Final    Value: RARE WBC PRESENT,BOTH PMN AND MONONUCLEAR     NO ORGANISMS SEEN   Culture NO GROWTH 1 DAY   Final    Report Status PENDING   Incomplete   ANAEROBIC CULTURE     Status: Normal (Preliminary result)   Collection Time   11/21/11  3:58 PM      Component Value Range Status  Comment   Specimen Description PLEURAL FLUID RIGHT   Final    Special Requests SWAB RECIEVED   Final    Gram Stain PENDING   Incomplete    Culture     Final    Value: NO ANAEROBES ISOLATED; CULTURE IN PROGRESS FOR 5 DAYS   Report Status PENDING   Incomplete   TISSUE CULTURE     Status: Normal (Preliminary result)   Collection Time   11/21/11  4:06 PM      Component Value Range Status Comment   Specimen Description TISSUE   Final    Special Requests PLEURAL PEEL   Final    Gram Stain     Final    Value: FEW WBC PRESENT,BOTH PMN AND MONONUCLEAR     RARE SQUAMOUS EPITHELIAL CELLS PRESENT     NO ORGANISMS SEEN   Culture NO GROWTH 2 DAYS   Final    Report Status PENDING   Incomplete   ANAEROBIC CULTURE     Status: Normal (Preliminary result)   Collection Time   11/21/11  4:06 PM      Component Value Range Status Comment   Specimen Description TISSUE   Final    Special Requests PLEURAL PEEL   Final    Gram Stain PENDING   Incomplete    Culture     Final    Value: NO ANAEROBES ISOLATED; CULTURE IN PROGRESS FOR 5 DAYS   Report Status PENDING   Incomplete   AFB CULTURE WITH SMEAR     Status: Normal (Preliminary result)   Collection Time   11/21/11  4:06 PM      Component Value Range Status Comment   Specimen Description TISSUE   Final    Special Requests PLEURAL PEEL   Final    ACID FAST SMEAR NO ACID FAST BACILLI SEEN   Final    Culture     Final    Value: CULTURE WILL BE EXAMINED FOR 6 WEEKS BEFORE ISSUING A FINAL REPORT   Report Status PENDING   Incomplete    Studies/Results: Dg Chest Port 1 View  11/23/2011  *RADIOLOGY REPORT*  Clinical Data: Status post VATS.  Chest tubes remain  PORTABLE CHEST - 1 VIEW  Comparison: 11/22/2011  Findings: A right subclavian CVP and two right-sided chest tubes are stable in position.  No residual right pneumothorax is seen. Heart and mediastinal contours are unchanged with mild ectasia of the thoracic aorta. Persistent bibasilar volume loss is noted.   No new areas of focal atelectasis or infiltrate are seen.  A small amount of residual subcutaneous emphysema in the tissues of the right chest wall and stable right-sided pleural reaction are apparent.  IMPRESSION: Stable cardiopulmonary appearance with unchanged support tubes and bibasilar volume loss  Original Report Authenticated By: Bertha Stakes, M.D.   Medications: I have reviewed the patient's current medications. Scheduled Meds:    . albuterol  2.5 mg Nebulization Q6H  . bisacodyl  10  mg Oral Daily  . docusate sodium  100 mg Oral BID  . fentaNYL   Intravenous Q4H  . hydrocortisone sod succinate (SOLU-CORTEF) injection  25 mg Intravenous Q8H  . insulin aspart  0-24 Units Subcutaneous Q4H  . mulitivitamin with minerals  1 tablet Oral Daily  . nebivolol  5 mg Oral Daily  . nicotine  21 mg Transdermal Daily  . piperacillin-tazobactam (ZOSYN)  IV  3.375 g Intravenous Q8H  . predniSONE  40 mg Oral Q breakfast  . sodium chloride  3 mL Intravenous Q12H  . vancomycin  1,000 mg Intravenous Q8H  . DISCONTD: albuterol  2.5 mg Nebulization Q4H WA  . DISCONTD: hydrocortisone sod succinate (SOLU-CORTEF) injection  50 mg Intravenous Once   Continuous Infusions:    . bupivacaine ON-Q pain pump    . dextrose 5 % and 0.45 % NaCl with KCl 10 mEq/L 20 mL/hr at 11/24/11 0600   PRN Meds:.sodium chloride, alum & mag hydroxide-simeth, diphenhydrAMINE, diphenhydrAMINE, ibuprofen, ketorolac, naloxone, ondansetron (ZOFRAN) IV, ondansetron (ZOFRAN) IV, oxyCODONE, polyethylene glycol, potassium chloride, senna-docusate, sodium chloride, sodium chloride, traZODone Assessment/Plan:  1) Empyema - Admitted for pain and pleural effusion. CT showed moderate, possibly loculated pleural effusion. 11/19/2011 IR did diagnostic and therapeutic thoracentesis which unfortunately showed that the effusion is indeed loculated and was only incompletely drained. Thoracic surgery consult obtained to evaluate for  surgical evacuation and will perform decortication tomorrow. Vancomycin and Zosyn started 11/19/2011. Pleural LDH 1051 and a serum LDH 322. Clearly this is an exudative effusion/empyema with many WBCs (6140, predominantly neutrophils). Pleural fluid no growth to date. Gram stain, AFB and fungal smears negative.  Postop day 3 from right vats and decortication, healing well. All chest tubes out.  -- Pain control oxycodone IR.   -- Vancomycin and Zosyn day 7 -- Will plan to discharge patient on Augmentin and doxycycline  2) rheumatoid arthritis - stable. Now on PO prednisone. Received 50 mg IV once preop followed by 25 mg TID x 3 days. As of today we will continue 40 mg prednisone for possible acute flare (2/2 left shoulder bursitis and chronic low back pain) and rapid taper.   -- prednisone 40 mg to begin today -- prednisone 30 mg tomorrow -- prednisone 20 mg 6/2 -- prednisone 10 mg 6/3 -- prednisone 5 mg 6/4 until changed by his rheumatologist.   3) hypertension - blood pressure elevated overnight, otherwise well controlled on home regimen.  -- Nebivolol 5 mg by mouth daily    4) history of SLE - unlikely to be SLE given negative workup and men with SLE  generally do very poorly and he is currently not on therapy besides prednisone. We will continue his current therapy.    5) insomnia - stable, patient will continue to use trazodone  DVT prophylaxis -SCDs prior to surgery  Disposition: Pending recovery from surgical evaluation/correction for loculated parapneumonic effusion/empyema.  The patient has a followup appointment with me, Quentin Ore, on June 7 at 11:15 AM. IMTS will sign off. Many thanks to Dr. Donata Clay for his excellent care of our patient.  6  Savior Himebaugh  PGY-I, Internal Medicine Resident  Pager: [phone] (7AM-5PM) 7:59 AM

## 2011-11-24 NOTE — Discharge Instructions (Addendum)
When you are discharged home, please follow the following taper of your prednisone. Please begin taper according to your date of discharge. -- prednisone 20 mg in the morning of 6/2  -- prednisone 10 mg in the morning of 6/3  -- prednisone 5 mg 6/4 each morning until changed by your rheumatologist.   Pleural Effusion The lining covering your lungs and the inside of your chest is called the pleura. Usually, the space between the 2 pleura contains no air and only a thin layer of fluid. A pleural effusion is an abnormal buildup of fluid in the pleural space. Fluid gathers when there is increased pressure in the lung vessels. This forces fluids out of the lungs and into the pleural space. Vessels may also leak fluids when there are infections, such as pneumonia, or other causes of soreness and redness (inflammation). Fluids leak into the lungs when protein in the blood is low or when certain vessels (lymphatics) are blocked. Finding a pleural effusion is important because it is usually caused by another disease. In order to treat a pleural effusion, your caregiver needs to find its cause. If left untreated, a large amount of fluid can build up and cause collapse of the lung. CAUSES   Heart failure.   Infections (pneumonia, tuberculosis), pulmonary embolism, pulmonary infarction.   Cancer (primary lung and metastatic), asbestosis.   Liver failure (cirrhosis).   Nephrotic syndrome, peritoneal dialysis, kidney problems (uremia).   Collagen vascular disease (systemic lupus erythematosis, rheumatoid arthritis).   Injury (trauma) to the chest or rupture of the digestive tube (esophagus).   Material in the chest or pleural space (hemothorax, chylothorax).   Pancreatitis.   Surgery.   Drug reactions.  SYMPTOMS  A pleural effusion can decrease the amount of space available for breathing and make you short of breath. The fluid can become infected, which may cause pain and fever. Often, the pain is  worse when taking a deep breath. The underlying disease (heart failure, pneumonia, blood clot, tuberculosis, cancer) may also cause symptoms. DIAGNOSIS   Your caregiver can usually tell what is wrong by talking to you (taking a history), doing an exam, and taking a routine X-ray. If the X-ray shows fluid in your chest, often fluid is removed from your chest with a needle for testing (diagnostic thoracentesis).   Sometimes, more specialized X-rays may be needed.   Sometimes, a small piece of tissue is removed and examined by a specialist (biopsy).  TREATMENT  Treatment varies based on what caused the pleural effusion. Treatments include:  Removing as much fluid as possible using a needle (thoracentesis) to improve the cough and shortness of breath. This is a simple procedure which can be done at bedside. The risks are bleeding, infection, collapse of a lung, or low blood pressure.   Placing a tube in the chest to drain the effusion (tube thoracostomy). This is often used when there is an infection in the fluid. This is a simple procedure which can often be done at bedside or in a clinic. The procedure may be painful. The risks are the same as using a needle to drain the fluid. The chest tube usually remains for a few days and is connected to suction to improve fluid drainage. The tube, after placement, usually does not cause much discomfort.   Surgical removal of fibrous debris in and around the pleural space (decortication). This may be done with a flexible telescope (thoracoscope) through a small or large cut (incision). This is helpful for  patients who have fibrosis or scar tissue that prevents complete lung expansion. The risks are infection, blood loss, and side effects from general anesthesia.   Sometimes, a procedure called pleurodesis is done. A chest tube is placed and the fluid is drained. Next, an agent (tetracycline, talc powder) is added to the pleural space. This causes the lung and  chest wall to stick together (adhesion). This leaves no potential space for fluid to build up. The risks include infection, blood loss, and side effects from general anesthesia.   If the effusion is caused by infection, it may be treated with antibiotics and improve without draining.  HOME CARE INSTRUCTIONS   Take any medicines exactly as prescribed.   Follow up with your caregiver as directed.   Monitor your exercise capacity (the amount of walking you can do before you get short of breath).   Do not smoke. Ask your caregiver for help quitting.  SEEK MEDICAL CARE IF:   Your exercise capacity seems to get worse or does not improve with time.   You do not recover from your illness.  SEEK IMMEDIATE MEDICAL CARE IF:   Shortness of breath or chest pain develops or gets worse.   You have an oral temperature above 102 F (38.9 C), not controlled by medicine.   You develop a new cough, especially if the mucus (phlegm) is discolored.  MAKE SURE YOU:   Understand these instructions.   Will watch your condition.   Will get help right away if you are not doing well or get worse.  Document Released: 06/12/2005 Document Revised: 06/01/2011 Document Reviewed: 02/01/2007 [facility] Patient Information 2012 [place], [place].

## 2011-11-25 ENCOUNTER — Inpatient Hospital Stay (HOSPITAL_COMMUNITY): Payer: Medicare HMO

## 2011-11-25 LAB — BASIC METABOLIC PANEL
BUN: 19 mg/dL (ref 6–23)
CO2: 29 mEq/L (ref 19–32)
Calcium: 9.3 mg/dL (ref 8.4–10.5)
Chloride: 102 mEq/L (ref 96–112)
Creatinine, Ser: 1.07 mg/dL (ref 0.50–1.35)
GFR calc Af Amer: >90 mL/min (ref >90)
GFR calc non Af Amer: 79 mL/min — ABNORMAL LOW (ref >90)
Glucose, Bld: 86 mg/dL (ref 70–99)
Potassium: 3.6 mEq/L (ref 3.5–5.1)
Sodium: 142 mEq/L (ref 135–145)

## 2011-11-25 LAB — CBC
HCT: 32.4 % — ABNORMAL LOW (ref 39.0–52.0)
Hemoglobin: 10.3 g/dL — ABNORMAL LOW (ref 13.0–17.0)
MCH: 26.5 pg (ref 26.0–34.0)
MCHC: 31.8 g/dL (ref 30.0–36.0)
MCV: 83.5 fL (ref 78.0–100.0)
Platelets: 233 10*3/uL (ref 150–400)
RBC: 3.88 MIL/uL — ABNORMAL LOW (ref 4.22–5.81)
RDW: 15.3 % (ref 11.5–15.5)
WBC: 10.3 10*3/uL (ref 4.0–10.5)

## 2011-11-25 LAB — GLUCOSE, CAPILLARY
Glucose-Capillary: 100 mg/dL — ABNORMAL HIGH (ref 70–99)
Glucose-Capillary: 117 mg/dL — ABNORMAL HIGH (ref 70–99)
Glucose-Capillary: 94 mg/dL (ref 70–99)

## 2011-11-25 LAB — BODY FLUID CULTURE

## 2011-11-25 LAB — TISSUE CULTURE: Culture: NO GROWTH

## 2011-11-25 MED ORDER — SODIUM CHLORIDE 0.9 % IJ SOLN
10.0000 mL | INTRAMUSCULAR | Status: DC | PRN
  Administered 2011-11-25 – 2011-11-26 (×2): 10 mL

## 2011-11-25 MED ORDER — DOXYCYCLINE HYCLATE 100 MG PO TABS: 100.0000 mg | ORAL_TABLET | Freq: Two times a day (BID) | ORAL | Status: AC

## 2011-11-25 MED ORDER — POTASSIUM CHLORIDE CRYS ER 20 MEQ PO TBCR
40.0000 meq | EXTENDED_RELEASE_TABLET | Freq: Once | ORAL | Status: AC
  Administered 2011-11-25: 40 meq via ORAL
  Filled 2011-11-25: qty 2

## 2011-11-25 MED ORDER — INSULIN ASPART 100 UNIT/ML ~~LOC~~ SOLN: 0.0000 [IU] | Freq: Three times a day (TID) | SUBCUTANEOUS | Status: DC

## 2011-11-25 MED ORDER — AMOXICILLIN-POT CLAVULANATE 500-125 MG PO TABS: 1.0000 | ORAL_TABLET | Freq: Two times a day (BID) | ORAL | Status: AC

## 2011-11-25 MED ORDER — OXYCODONE HCL 5 MG PO TABS: 5.0000 mg | ORAL_TABLET | ORAL | Status: DC | PRN

## 2011-11-25 MED ORDER — NICOTINE 21 MG/24HR TD PT24: 1.0000 | MEDICATED_PATCH | Freq: Every day | TRANSDERMAL | Status: AC

## 2011-11-25 NOTE — Progress Notes (Signed)
Pt continues to refuse scheduled Neb treatments. RT assessed pt to Albuterol Q4 PRN. Advised pt if he had any difficulty breathing, or decided he wanted a treatment, to notify RN and RT would give neb. Pt agrees. No other changes at this time. RT will continue to monitor.

## 2011-11-25 NOTE — Progress Notes (Addendum)
                    [address]            [name] [address]          [phone]     4 Days Post-Op Procedure(s) (LRB): VIDEO ASSISTED THORACOSCOPY (VATS)/DECORTICATION (Right)  Subjective: Sore, but breathing stable.   Objective: Vital signs in last 24 hours: Patient Vitals for the past 24 hrs:  BP Temp Temp src Pulse Resp SpO2  11/25/11 0606 112/76 mmHg 98.9 F (37.2 C) Oral 81  18  92 %  11/24/11 2013 115/73 mmHg 99 F (37.2 C) Oral 74  18  96 %  11/24/11 1432 115/73 mmHg 97.5 F (36.4 C) Oral 79  18  96 %  11/24/11 1137 114/81 mmHg 98.1 F (36.7 C) Oral 82  18  94 %  11/24/11 1100 - - - 87  17  100 %  11/24/11 1000 - - - 93  17  94 %  11/24/11 0900 106/88 mmHg - - 92  18  91 %   Current Weight  11/24/11 167 lb 12.3 oz (76.1 kg)     Intake/Output from previous day: 05/31 0701 - 06/01 0700 In: 622.5 [I.V.:260; IV Piggyback:362.5] Out: 625 [Urine:600; Chest Tube:25]    PHYSICAL EXAM:  Heart: RRR Lungs: clear Wound: clean and dry   Lab Results: CBC: Basename 11/25/11 0527 11/24/11 0404  WBC 10.3 13.7*  HGB 10.3* 10.6*  HCT 32.4* 33.3*  PLT 233 223   BMET:  Basename 11/25/11 0527 11/24/11 0404  NA 142 138  K 3.6 4.4  CL 102 99  CO2 29 30  GLUCOSE 86 136*  BUN 19 17  CREATININE 1.07 1.07  CALCIUM 9.3 9.3   CXR: IMPRESSION:  Right chest tube removed. No significant pneumothorax.  Stable basilar atelectasis and right chest pleural thickening   Assessment/Plan: S/P Procedure(s) (LRB): VIDEO ASSISTED THORACOSCOPY (VATS)/DECORTICATION (Right) ID- continue Vanc/Zosyn, switch to po Augmetin/Doxy at discharge. Prednisone taper per IM. Wean and d/c O2. Possibly home in am if remains stable.   LOS: 7 days    COLLINS,GINA H 11/25/2011    Chart reviewed, patient examined, agree with above.

## 2011-11-25 NOTE — Discharge Summary (Signed)
[address]            [name] [address]          [phone]         Discharge Summary  Name: [name] [name] DOB: October 07, 1960 51 y.o. MRN: [id number]  Admission Date: 11/18/2011 Discharge Date:    Admitting Diagnosis: Chest pain Cough   Discharge Diagnosis:  Right lower lobe pneumonia Right empyema Hyperlipidemia Rheumatoid arthritis Hypertension History of lupus History of tobacco abuse     Procedures: Procedure(s): RIGHT VIDEO ASSISTED THORACOSCOPY, RIGHT LOWER LOBE DECORTICATION, DRAINAGE OF EMPYEMA on  11/21/2011   HPI:  The patient is a 51 y.o. male smoker recently admitted for right lower lobe pneumonia treated with IV antibiotics for 3 days and discharged home on oral azithromycin. The patient's symptoms of productive cough and malaise improved, however he had progressive right pleuritic chest pain requiring narcotics. When he ran out of narcotic medication, he presented to the ED and a chest x-ray showed a moderate to large right pleural effusion associated with right lower lobe airspace disease. The patient had a CT scan of the chest which confirmed this to be a probable empyema and the patient was admitted for further treatment.     Hospital Course:  The patient was admitted to [facility] on 11/18/2011. He was placed back on IV vancomycin and Zosyn. A ultrasound-guided thoracentesis removed 450 cc of cloudy blood tinged fluid however the chest x-ray showed no significant improvement. A thoracic surgical consultation was requested for possible vats-decortication. Dr. Donata Clay saw the patient and reviewed his films. He agreed with the need for surgical drainage. All risks, benefits and alternatives of surgery were explained in detail, and the patient agreed to proceed. The patient was taken to the operating room and underwent the above procedure.    The postoperative course has been uneventful. All chest tubes have been  removed, and followup chest x-rays have remained stable with no significant pneumothorax and stable basilar atelectasis. He has completed a full course of IV antibiotics and will be switched to oral Augmentin and doxycycline at the time of discharge. His pulmonary status has remained stable and he is off oxygen. His incisions are healing well. He is ambulating in the halls without difficulty and tolerating a regular diet. He is currently tapering his prednisone per internal medicine. We anticipate discharge home within the next 24 hours provided no acute changes occur. His intraoperative cultures yielded no growth.   Recent vital signs:  Filed Vitals:   11/25/11 0606  BP: 112/76  Pulse: 81  Temp: 98.9 F (37.2 C)  Resp: 18    Recent laboratory studies:  CBC: Basename 11/25/11 0527 11/24/11 0404  WBC 10.3 13.7*  HGB 10.3* 10.6*  HCT 32.4* 33.3*  PLT 233 223   BMET:  Basename 11/25/11 0527 11/24/11 0404  NA 142 138  K 3.6 4.4  CL 102 99  CO2 29 30  GLUCOSE 86 136*  BUN 19 17  CREATININE 1.07 1.07  CALCIUM 9.3 9.3    PT/INR: No results found for this basename: LABPROT,INR in the last 72 hours  Discharge Medications:   Medication List  As of 11/25/2011 10:57 AM   STOP taking these medications         HYDROcodone-acetaminophen 5-325 MG per tablet         TAKE these medications  acetaminophen 500 MG tablet   Commonly known as: TYLENOL   Take 1 tablet (500 mg total) by mouth every 6 (six) hours as needed for pain or fever (or Fever >/= 101).      amoxicillin-clavulanate 500-125 MG per tablet   Commonly known as: AUGMENTIN   Take 1 tablet (500 mg total) by mouth 2 (two) times daily. X 2 weeks      doxycycline 100 MG tablet   Commonly known as: VIBRA-TABS   Take 1 tablet (100 mg total) by mouth 2 (two) times daily. X 2 weeks      guaiFENesin 600 MG 12 hr tablet   Commonly known as: MUCINEX   Take 1,200 mg by mouth 2 (two) times daily.      mulitivitamin with  minerals Tabs   Take 1 tablet by mouth daily.      nebivolol 5 MG tablet   Commonly known as: BYSTOLIC   Take 1 tablet (5 mg total) by mouth daily.      nicotine 21 mg/24hr patch   Commonly known as: NICODERM CQ - dosed in mg/24 hours   Place 1 patch onto the skin daily.      oxyCODONE 5 MG immediate release tablet   Commonly known as: Oxy IR/ROXICODONE   Take 1-2 tablets (5-10 mg total) by mouth every 4 (four) hours as needed for pain.      predniSONE 10 MG tablet   Commonly known as: DELTASONE   When you are discharged home, please follow the following taper of your prednisone. Please begin taper according to your date of discharge.  -- prednisone 20 mg (2 tablets) on the morning of 6/2   -- prednisone 10 mg (1 tablet) on the morning of 6/3   -- prednisone 5 mg (1/2 tablet) on the morning of 6/4, and continue this dose until changed by your rheumatologist.  Prescription is for 1 month's supply            Discharge Instructions:  The patient is to refrain from driving, heavy lifting or strenuous activity.  May shower daily and clean incisions with soap and water.  May resume regular diet.  Discharge Orders    Future Appointments: Provider: Department: Dept Phone: Center:   12/01/2011 11:15 AM Duwaine Maxin, MD [facility] [phone] [facility]      Follow-up Information    Follow up with [facility], MD on 12/01/2011. (Appointment is at 11:15 AM)    Contact information:   [address] [place] [address] [phone]       Follow up with [facility] Dinah Beers, MD. (office will call you with an appointment)    Contact information:   [address] [place] [address] [phone]       Follow up with [facility]. (Office will arrange suture removal in 1 week)           Zayquan Bogard H 11/25/2011, 10:57 AM

## 2011-11-26 ENCOUNTER — Inpatient Hospital Stay (HOSPITAL_COMMUNITY): Payer: Medicare HMO

## 2011-11-26 LAB — ANAEROBIC CULTURE

## 2011-11-26 LAB — GLUCOSE, CAPILLARY: Glucose-Capillary: 82 mg/dL (ref 70–99)

## 2011-11-26 NOTE — Progress Notes (Signed)
                    [address]            [name] [address]          [phone]     5 Days Post-Op Procedure(s) (LRB): VIDEO ASSISTED THORACOSCOPY (VATS)/DECORTICATION (Right)  Subjective: Feels well, no complaints.  Objective: Vital signs in last 24 hours: Patient Vitals for the past 24 hrs:  BP Temp Temp src Pulse Resp SpO2  11/26/11 0441 121/82 mmHg 97.2 F (36.2 C) Oral 81  18  95 %  11/25/11 1940 112/74 mmHg 98.1 F (36.7 C) Oral 76  16  95 %  11/25/11 1349 117/73 mmHg 98.5 F (36.9 C) Oral 80  16  95 %   Current Weight  11/24/11 167 lb 12.3 oz (76.1 kg)     Intake/Output from previous day: 06/01 0701 - 06/02 0700 In: 697.7 [P.O.:480; I.V.:217.7] Out: -     PHYSICAL EXAM:  Heart: RRR Lungs:slightly decreased BS in bases Wound: clean and dry   Lab Results: CBC: Basename 11/25/11 0527 11/24/11 0404  WBC 10.3 13.7*  HGB 10.3* 10.6*  HCT 32.4* 33.3*  PLT 233 223   BMET:  Basename 11/25/11 0527 11/24/11 0404  NA 142 138  K 3.6 4.4  CL 102 99  CO2 29 30  GLUCOSE 86 136*  BUN 19 17  CREATININE 1.07 1.07  CALCIUM 9.3 9.3    PT/INR: No results found for this basename: LABPROT,INR in the last 72 hours  CXR: 1. No significant change in aeration to the lung bases compared  with previous exam.  2. Right subclavian catheter with tip in the SVC.   Assessment/Plan: S/P Procedure(s) (LRB): VIDEO ASSISTED THORACOSCOPY (VATS)/DECORTICATION (Right) Doing well. Plan home today-instructions reviewed with patient.   LOS: 8 days    Ajai Harville H 11/26/2011

## 2011-11-26 NOTE — Progress Notes (Signed)
Patient discharged per order and protocol. All IV lines removed per protocol, discharge instructions and prescriptions given and reviewed. All questions answered.  Patient discharged home.  Laron Angelini, Chrystine Oiler

## 2011-11-26 NOTE — Discharge Summary (Signed)
patient examined and medical record reviewed,agree with above note. [name] [name],[name] [name] 11/26/2011

## 2011-11-28 ENCOUNTER — Encounter: Payer: Medicare HMO | Admitting: Internal Medicine

## 2011-11-28 LAB — MISCELLANEOUS TEST

## 2011-11-30 ENCOUNTER — Encounter: Payer: Self-pay | Admitting: Cardiothoracic Surgery

## 2011-12-01 ENCOUNTER — Other Ambulatory Visit: Payer: Self-pay

## 2011-12-01 ENCOUNTER — Encounter: Payer: Medicare HMO | Admitting: Internal Medicine

## 2011-12-01 ENCOUNTER — Encounter: Payer: Self-pay | Admitting: Internal Medicine

## 2011-12-01 ENCOUNTER — Ambulatory Visit (INDEPENDENT_AMBULATORY_CARE_PROVIDER_SITE_OTHER): Payer: Medicare HMO | Admitting: Internal Medicine

## 2011-12-01 VITALS — BP 112/81 | HR 83 | Temp 97.0°F | Ht 69.0 in | Wt 171.8 lb

## 2011-12-01 DIAGNOSIS — G8918 Other acute postprocedural pain: Secondary | ICD-10-CM

## 2011-12-01 DIAGNOSIS — M329 Systemic lupus erythematosus, unspecified: Secondary | ICD-10-CM

## 2011-12-01 DIAGNOSIS — F17200 Nicotine dependence, unspecified, uncomplicated: Secondary | ICD-10-CM

## 2011-12-01 DIAGNOSIS — I10 Essential (primary) hypertension: Secondary | ICD-10-CM

## 2011-12-01 DIAGNOSIS — J869 Pyothorax without fistula: Secondary | ICD-10-CM

## 2011-12-01 MED ORDER — HYDROCODONE-ACETAMINOPHEN 7.5-325 MG PO TABS: 1.0000 | ORAL_TABLET | ORAL | Status: DC | PRN

## 2011-12-01 NOTE — Telephone Encounter (Signed)
Norco 7.5/325 mg po every 4-6 hours prn for pain, 0 refill called to pharm [phone].

## 2011-12-01 NOTE — Assessment & Plan Note (Signed)
Status post VATS-decortication on 11/21/2011 for loculated pleural effusion and empyema of the right side. Tolerated the procedure well and has been feeling good since discharge. On doxycycline and Augmentin. Has followup with surgery on Monday, in 3 days. Denies fevers or chills, does have occasional cough with some chest pain, but he feels he is slowly improving. We will continue the course and the patient can certainly call or return if he starts worsening. - Continue doxycycline and Augmentin - Followup with surgery on Monday 6/10 - RTC if worsens

## 2011-12-01 NOTE — Assessment & Plan Note (Signed)
Patient has not smoked since discharge. Has been using 21 mg patches daily. Interested in continuing. We will continue this dose for a full 6 weeks, and see him back in clinic for titration downwards if he has remained abstinent from smoking. - RTC in 5 weeks , And can titrate down to 14 mg

## 2011-12-01 NOTE — Assessment & Plan Note (Signed)
Followed by rheumatologist Dr. Nickola Major, but owes the practice many and cannot see them until he pays it. Is currently on prednisone 5 mg daily, presumably both for SLE and rheumatoid arthritis. He takes no DMARD's. - Followup with Dr. Nickola Major as able - Continue prednisone

## 2011-12-01 NOTE — Assessment & Plan Note (Signed)
BP Readings from Last 3 Encounters:  12/01/11 112/81  11/26/11 121/82  11/26/11 121/82   currently at goal on diastolic. We'll continue

## 2011-12-01 NOTE — Progress Notes (Signed)
Subjective:     Patient ID: [name] [name], male   DOB: 09/30/1960, 51 y.o.   MRN: [id number]  HPI Patient is a very pleasant 51 year old man with a history of SLE, rheumatoid arthritis, and recent hospitalization for loculated pleural effusion status post VATS who presents for followup.  Patient was just discharged 6 days ago, and is feeling well. He does complain of some chest pain with cough and some soreness at the incision, but the symptoms are minimal. He does have occasional cough with some dark brown phlegm, but again, this is minimal and he feels it is decreasing in frequency. He denies any fevers or chills. He is getting moderate pain relief from his Percocet, which he recently got changed to hydrocodone. He is currently taking doxycycline and Augmentin, as well as prednisone 5 mg. He has not smoked his discharge, and has been using patches with good compliance.  Review of Systems As per history of present illness    Objective:   Physical Exam GEN: NAD.  Alert and oriented x 3.  Pleasant, conversant, and cooperative to exam. RESP:  CTAB, no w/r/r.  R thoracotomy incision is non-erythematous, healing well. bandage CDI CARDIOVASCULAR: RRR, S1, S2, no 2/6 HSM @ LUSB EXT: warm and dry. No edema in b/l LE     Assessment:         Plan:

## 2011-12-04 ENCOUNTER — Encounter (INDEPENDENT_AMBULATORY_CARE_PROVIDER_SITE_OTHER): Payer: Self-pay

## 2011-12-04 DIAGNOSIS — J869 Pyothorax without fistula: Secondary | ICD-10-CM

## 2011-12-07 ENCOUNTER — Other Ambulatory Visit: Payer: Self-pay | Admitting: *Deleted

## 2011-12-07 DIAGNOSIS — G8918 Other acute postprocedural pain: Secondary | ICD-10-CM

## 2011-12-07 MED ORDER — HYDROCODONE-ACETAMINOPHEN 7.5-325 MG PO TABS: 1.0000 | ORAL_TABLET | ORAL | Status: DC | PRN

## 2011-12-12 ENCOUNTER — Other Ambulatory Visit: Payer: Self-pay | Admitting: *Deleted

## 2011-12-12 DIAGNOSIS — G8918 Other acute postprocedural pain: Secondary | ICD-10-CM

## 2011-12-12 MED ORDER — HYDROCODONE-ACETAMINOPHEN 7.5-325 MG PO TABS: 1.0000 | ORAL_TABLET | ORAL | Status: AC | PRN

## 2011-12-13 ENCOUNTER — Other Ambulatory Visit: Payer: Self-pay | Admitting: Cardiothoracic Surgery

## 2011-12-13 DIAGNOSIS — J869 Pyothorax without fistula: Secondary | ICD-10-CM

## 2011-12-20 ENCOUNTER — Encounter: Payer: Self-pay | Admitting: Cardiothoracic Surgery

## 2011-12-20 ENCOUNTER — Ambulatory Visit (INDEPENDENT_AMBULATORY_CARE_PROVIDER_SITE_OTHER): Payer: Self-pay | Admitting: Cardiothoracic Surgery

## 2011-12-20 ENCOUNTER — Ambulatory Visit
Admission: RE | Admit: 2011-12-20 | Discharge: 2011-12-20 | Disposition: A | Discharge status: Home / Self Care | Payer: Medicare HMO | Source: Ambulatory Visit | Attending: Cardiothoracic Surgery | Admitting: Cardiothoracic Surgery

## 2011-12-20 VITALS — BP 142/96 | HR 96 | Resp 20 | Ht 68.0 in | Wt 177.0 lb

## 2011-12-20 DIAGNOSIS — Z09 Encounter for follow-up examination after completed treatment for conditions other than malignant neoplasm: Secondary | ICD-10-CM

## 2011-12-20 DIAGNOSIS — J869 Pyothorax without fistula: Secondary | ICD-10-CM

## 2011-12-20 DIAGNOSIS — M329 Systemic lupus erythematosus, unspecified: Secondary | ICD-10-CM

## 2011-12-20 NOTE — Progress Notes (Signed)
PCP is Kristie Cowman, MD Referring Provider is Rozell Searing *  Chief Complaint  Patient presents with  . Routine Post Op    3 week f/u from surgery with CXR, S/P Rt VATS, decortication of right lower lobe and drainage of empyema on 11/21/11     HPI:   Past Medical History  Diagnosis Date  . HLD (hyperlipidemia)   . Lupus     Reported to be dx in 1998 but markers were repeated 2012 Dr Nickola Major and were noraml. This is likely an innacruate dx carried forward by the bloody EMR.   . RA (rheumatoid arthritis)   . Hypertension   . Heart murmur   . Pneumonia 10/2011    Past Surgical History  Procedure Date  . Fracture surgery     plate in R arm  . Right arm     plate    Family History  Problem Relation Age of Onset  . Stroke Neg Hx   . Cancer Neg Hx     Social History History  Substance Use Topics  . Smoking status: Former Smoker -- 1.0 packs/day for 25 years    Types: Cigarettes    Quit date: 11/06/2011  . Smokeless tobacco: Never Used  . Alcohol Use: No    Current Outpatient Prescriptions  Medication Sig Dispense Refill  . acetaminophen (TYLENOL) 500 MG tablet Take 1 tablet (500 mg total) by mouth every 6 (six) hours as needed for pain or fever (or Fever >/= 101).  60 tablet  0  . guaiFENesin (MUCINEX) 600 MG 12 hr tablet Take 1,200 mg by mouth 2 (two) times daily.      [name] HYDROcodone-acetaminophen (NORCO) 7.5-325 MG per tablet Take 1 tablet by mouth every 4 (four) hours as needed for pain.  40 tablet  0  . Multiple Vitamin (MULITIVITAMIN WITH MINERALS) TABS Take 1 tablet by mouth daily.      . nebivolol (BYSTOLIC) 5 MG tablet Take 1 tablet (5 mg total) by mouth daily.  30 tablet  1  . nicotine (NICODERM CQ - DOSED IN MG/24 HOURS) 21 mg/24hr patch Place 1 patch onto the skin daily.  28 patch  1  . predniSONE (DELTASONE) 10 MG tablet When you are discharged home, please follow the following taper of your prednisone. Please begin taper according to your date of  discharge. -- prednisone 20 mg (2 tablets) on the morning of 6/2  -- prednisone 10 mg (1 tablet) on the morning of 6/3  -- prednisone 5 mg (1/2 tablet) on the morning of 6/4, and continue this dose until changed by your rheumatologist. Prescription is for 1 month's supply  18 tablet  0    Allergies  Allergen Reactions  . Ramipril (Ramipril) Rash  . Tramadol Itching and Rash    Review of Systems persistent postthoracotomy pain but incision is well-healed no fevers night sweats improved appetite improved strength mild shortness of breath with exertion  BP 142/96  Pulse 96  Resp 20  Ht 5\' 8"  (1.727 m)  Wt 177 lb (80.287 kg)  BMI 26.91 kg/m2  SpO2 97% Physical Exam General alert and comfortable Chest well-healed incision clear breath sounds Cardiac regular heart rhythm without murmur Extremities no edema no tenderness  Diagnostic Tests: Chest x-ray both lung fields clear  Impression: Resolution of right empyema following decortication. Patient still in well smoke/cigarette free. He'll will return to the internal medicine clinic his primary care. Return here as needed. A prescription for hydrocodone was provided at  this visit.  Plan: And he and and and and and and a a and a and and and and and and and a a and a a and and and a

## 2011-12-26 ENCOUNTER — Other Ambulatory Visit: Payer: Self-pay | Admitting: *Deleted

## 2011-12-26 DIAGNOSIS — G8918 Other acute postprocedural pain: Secondary | ICD-10-CM

## 2011-12-26 MED ORDER — HYDROCODONE-ACETAMINOPHEN 7.5-325 MG PO TABS
1.0000 | ORAL_TABLET | Freq: Four times a day (QID) | ORAL | Status: AC | PRN
Start: 2011-12-26 — End: 2012-01-05

## 2012-01-01 LAB — AFB CULTURE WITH SMEAR (NOT AT ARMC): Acid Fast Smear: NONE SEEN

## 2012-01-04 LAB — AFB CULTURE WITH SMEAR (NOT AT ARMC)

## 2012-01-08 ENCOUNTER — Encounter: Payer: Self-pay | Admitting: Internal Medicine

## 2012-01-08 ENCOUNTER — Ambulatory Visit (INDEPENDENT_AMBULATORY_CARE_PROVIDER_SITE_OTHER): Payer: Medicare HMO | Admitting: Internal Medicine

## 2012-01-08 VITALS — BP 144/98 | HR 73 | Temp 97.2°F | Ht 68.0 in | Wt 181.7 lb

## 2012-01-08 DIAGNOSIS — M329 Systemic lupus erythematosus, unspecified: Secondary | ICD-10-CM

## 2012-01-08 DIAGNOSIS — J90 Pleural effusion, not elsewhere classified: Secondary | ICD-10-CM

## 2012-01-08 DIAGNOSIS — I10 Essential (primary) hypertension: Secondary | ICD-10-CM

## 2012-01-08 DIAGNOSIS — J869 Pyothorax without fistula: Secondary | ICD-10-CM

## 2012-01-08 DIAGNOSIS — Z9889 Other specified postprocedural states: Secondary | ICD-10-CM

## 2012-01-08 DIAGNOSIS — F17200 Nicotine dependence, unspecified, uncomplicated: Secondary | ICD-10-CM

## 2012-01-08 MED ORDER — NEBIVOLOL HCL 10 MG PO TABS: 10.0000 mg | ORAL_TABLET | Freq: Every day | ORAL | Status: DC

## 2012-01-08 NOTE — Patient Instructions (Signed)
It was nice to meet you [name] [name].  Your blood pressure is still a little elevated.  I will increase the Bystolic blood pressure medicine from 5 mg daily to 10 mg daily and have you return in 2 weeks for a re-check on your blood pressure. As for the pain medication that Dr. Donata Clay prescribed, please take this as directed which was 1 pill twice a day. He gave you a 20 day supply.   Great job with you QUITTING smoking!  If you feel that you need the nicotine patches again, please let this clinic know.

## 2012-01-08 NOTE — Progress Notes (Signed)
Subjective:     Patient ID: [name] [name], male   DOB: [birth year], 51 y.o.   MRN: [id number]  HPI Reports today for followup of his hypertension. He status post pleural decortication and vats per Dr. Morton Peters. He had followup with Dr. Morton Peters in late June and was prescribed hydrocodone at that visit. Blood pressure today is mildly elevated at 151/96 and 144/98 on recheck. This is on systolic 5 mg daily. Forty pills of Hydrocodone 7.5 was filled 1 day ago on 01/07/2012 with directions to take 1 bid.  He has the pills with him but has only 26 pills left at this time when there should at least be 24 pills. Of note he does have a history of pain contract violation in the past but is not requesting pain meds at this time.    He states that he has not smoked cigarettes since hospital discharge.  Review of Systems  Constitutional: Negative for appetite change and fatigue.  HENT: Negative for congestion.   Eyes: Negative for visual disturbance.  Respiratory: Negative for cough, chest tightness, shortness of breath and wheezing.   Cardiovascular: Negative for chest pain.  Neurological: Negative for headaches.       Objective:   Physical Exam  Constitutional: He is oriented to person, place, and time. He appears well-developed and well-nourished. No distress.  HENT:  Head: Normocephalic and atraumatic.  Eyes: Conjunctivae and EOM are normal. Pupils are equal, round, and reactive to light.  Neck: Normal range of motion. Neck supple.  Cardiovascular: Normal rate, regular rhythm, normal heart sounds and intact distal pulses.   No murmur heard. Pulmonary/Chest: Effort normal and breath sounds normal.  Abdominal: Soft. Bowel sounds are normal.  Neurological: He is alert and oriented to person, place, and time.  Skin: Skin is warm and dry.  Psychiatric: He has a normal mood and affect.       Assessment:     1. Hypertension: above goal on Ramipril 5 mg qd 2. S/p VATS and decortication:  followed by Dr. Morton Peters of [facility]    Plan:     -will increase Ramipril to 10 mg po qd and recheck bp in 2 weeks -will defer prescribing longterm opiods given prior contract violation, pt currently with Hydrocodone prescription without refills per [facility] for post-surgical pain -pt declines further nicotine patches

## 2012-01-15 ENCOUNTER — Telehealth: Payer: Self-pay | Admitting: *Deleted

## 2012-01-15 NOTE — Telephone Encounter (Signed)
Pt stopped by clinic - needs referral regarding bursitis left shoulder and left hip. [employer] [phone] can see pt 01/16/12 at 8:30AM. Can call pt at [phone] or neighbor [name] at [phone]. Stanton Kidney Kelsa Jaworowski RN 01/15/12 11:15AM.

## 2012-01-23 ENCOUNTER — Encounter: Payer: Medicare HMO | Admitting: Internal Medicine

## 2012-02-05 ENCOUNTER — Encounter: Payer: Self-pay | Admitting: Internal Medicine

## 2012-02-05 ENCOUNTER — Ambulatory Visit (INDEPENDENT_AMBULATORY_CARE_PROVIDER_SITE_OTHER): Payer: Medicare HMO | Admitting: Internal Medicine

## 2012-02-05 VITALS — BP 143/101 | HR 88 | Temp 98.2°F | Ht 66.0 in | Wt 184.3 lb

## 2012-02-05 DIAGNOSIS — I10 Essential (primary) hypertension: Secondary | ICD-10-CM

## 2012-02-06 NOTE — Progress Notes (Signed)
Pt was checked in and was informed to wait in main waiting area till a room was free - pt aware running behind with schedule. Pt left clinic without talking to staff. Dr Garald Braver aware of BP and was out of BP as of 02/05/12. Stanton Kidney Rejina Odle RN 02/06/12 2:45PM

## 2012-02-12 ENCOUNTER — Encounter: Payer: Medicare HMO | Admitting: Internal Medicine

## 2012-02-19 ENCOUNTER — Encounter: Payer: Medicare HMO | Admitting: Internal Medicine

## 2012-02-23 ENCOUNTER — Encounter: Payer: Medicare HMO | Admitting: Internal Medicine

## 2012-02-29 NOTE — Progress Notes (Signed)
Patient ID: [name] [name], male   DOB: [birth year], 51 y.o.   MRN: [id number]  Pt left before being seen. Pt had elevated BP at 143/101 and HR at 88. He was asked to make a follow-up appointment which appears he did but had no show/cancellation. Pt was started on bystolic 10mg  in 01/08/12 with 6 refills but only one dispense. Would assess for possible bradycardia and other SE with this medication.

## 2012-04-08 ENCOUNTER — Encounter: Payer: Self-pay | Admitting: Internal Medicine

## 2012-04-08 ENCOUNTER — Ambulatory Visit (INDEPENDENT_AMBULATORY_CARE_PROVIDER_SITE_OTHER): Payer: Medicare HMO | Admitting: Internal Medicine

## 2012-04-08 VITALS — BP 136/94 | HR 82 | Temp 98.2°F | Ht 68.0 in | Wt 183.2 lb

## 2012-04-08 DIAGNOSIS — Z23 Encounter for immunization: Secondary | ICD-10-CM

## 2012-04-08 DIAGNOSIS — E785 Hyperlipidemia, unspecified: Secondary | ICD-10-CM

## 2012-04-08 DIAGNOSIS — I10 Essential (primary) hypertension: Secondary | ICD-10-CM

## 2012-04-08 LAB — COMPREHENSIVE METABOLIC PANEL
Albumin: 4 g/dL (ref 3.5–5.2)
Alkaline Phosphatase: 113 U/L (ref 39–117)
CO2: 28 mEq/L (ref 19–32)
Chloride: 105 mEq/L (ref 96–112)
Glucose, Bld: 86 mg/dL (ref 70–99)
Potassium: 4 mEq/L (ref 3.5–5.3)
Sodium: 140 mEq/L (ref 135–145)
Total Protein: 6.6 g/dL (ref 6.0–8.3)

## 2012-04-08 LAB — LIPID PANEL
LDL Cholesterol: 73 mg/dL (ref 0–99)
Triglycerides: 269 mg/dL — ABNORMAL HIGH (ref <150)

## 2012-04-08 MED ORDER — NEBIVOLOL HCL 10 MG PO TABS: 20.0000 mg | ORAL_TABLET | Freq: Every day | ORAL | Status: DC

## 2012-04-08 NOTE — Patient Instructions (Signed)
We have increased your blood pressure medicine since you report that it is often in the 150s during the week. Take 20 mg of the Bystolic a day and record your pressures for the next week or so and return to clinic for a recheck. If you start to feel lightheaded or dizzy, call the clinic. We may want to adjust or change your blood pressure medicine. Follow-up in 2 weeks.

## 2012-04-08 NOTE — Progress Notes (Signed)
  Subjective:    Patient ID: Duong Meservey, male    DOB: 09-24-1960, 51 y.o.   MRN: [id number]  HPI Mr. Kail Visconti for followup of his blood pressure after several months. In July his nebivolol was increased from 5 mg to 10 mg daily. Today his blood pressure is 136/94 pulse 82. He reports that he's actually been taking 2 of his blood pressure (20 mg total) at least 3 times a week for blood pressure measurements of 150s systolic. He denies headaches, chest pain, shortness of breath, palpitations lightheadedness or dizziness.   Review of Systems  Constitutional: Negative for fatigue.  Respiratory: Negative for shortness of breath.   Cardiovascular: Positive for leg swelling. Negative for chest pain and palpitations.  Neurological: Negative for weakness and headaches.       Objective:   Physical Exam  Constitutional: He is oriented to person, place, and time. He appears well-developed and well-nourished. No distress.  HENT:  Head: Normocephalic and atraumatic.  Eyes: Conjunctivae normal and EOM are normal. Pupils are equal, round, and reactive to light.  Neck: Normal range of motion. Neck supple.  Cardiovascular: Normal rate, regular rhythm, normal heart sounds and intact distal pulses.   Pulmonary/Chest: Effort normal and breath sounds normal.  Abdominal: Soft. Bowel sounds are normal.  Musculoskeletal: Normal range of motion. He exhibits edema.       Trace LE edema bilaterally  Neurological: He is alert and oriented to person, place, and time.  Skin: Skin is warm and dry.  Psychiatric: He has a normal mood and affect.          Assessment & Plan:  #1 hypertension: At goal today 136/94 pulse 82 bpm but given the patient reports frequent blood pressure elevations in the 150s systolic will increase to 20 mg of nebivolol daily. Patient is to record his blood pressure readings of the next 1-2 weeks and a return to clinic with a wall. At that time we will make further adjustments as  needed with consideration for adding diuretic. He is to call the clinic if he starts having hypotensive symptoms which were described to him. -check CMET to assess kidney and liver function, if elevated will need to change antihypertensive (of note nebivolol was started as inpatient)  #2 hyperlipidemia: not on statin, will check lipid panel today

## 2012-04-23 ENCOUNTER — Ambulatory Visit: Payer: Medicare HMO | Admitting: Internal Medicine

## 2012-08-02 ENCOUNTER — Ambulatory Visit: Payer: Medicare HMO | Admitting: Internal Medicine

## 2012-08-06 ENCOUNTER — Ambulatory Visit (INDEPENDENT_AMBULATORY_CARE_PROVIDER_SITE_OTHER): Payer: Medicare HMO | Admitting: Internal Medicine

## 2012-08-06 ENCOUNTER — Encounter: Payer: Self-pay | Admitting: Internal Medicine

## 2012-08-06 VITALS — BP 115/82 | HR 83 | Temp 97.2°F | Ht 68.0 in | Wt 173.2 lb

## 2012-08-06 DIAGNOSIS — M069 Rheumatoid arthritis, unspecified: Secondary | ICD-10-CM

## 2012-08-06 DIAGNOSIS — D649 Anemia, unspecified: Secondary | ICD-10-CM | POA: Insufficient documentation

## 2012-08-06 DIAGNOSIS — I10 Essential (primary) hypertension: Secondary | ICD-10-CM

## 2012-08-06 DIAGNOSIS — R42 Dizziness and giddiness: Secondary | ICD-10-CM

## 2012-08-06 HISTORY — DX: Anemia, unspecified: D64.9

## 2012-08-06 LAB — ANEMIA PANEL
ABS Retic: 54.2 10*3/uL (ref 19.0–186.0)
Iron: 55 ug/dL (ref 42–165)
RBC.: 5.42 MIL/uL (ref 4.22–5.81)
TIBC: 411 ug/dL (ref 215–435)
UIBC: 356 ug/dL (ref 125–400)

## 2012-08-06 LAB — CBC WITH DIFFERENTIAL/PLATELET
Basophils Relative: 1 % (ref 0–1)
HCT: 43.8 % (ref 39.0–52.0)
Hemoglobin: 14.7 g/dL (ref 13.0–17.0)
Lymphocytes Relative: 26 % (ref 12–46)
MCHC: 33.6 g/dL (ref 30.0–36.0)
Monocytes Absolute: 0.4 10*3/uL (ref 0.1–1.0)
Monocytes Relative: 6 % (ref 3–12)
Neutro Abs: 4.7 10*3/uL (ref 1.7–7.7)

## 2012-08-06 MED ORDER — NEBIVOLOL HCL 10 MG PO TABS: 10.0000 mg | ORAL_TABLET | Freq: Every day | ORAL | Status: DC

## 2012-08-06 NOTE — Progress Notes (Signed)
Subjective:   Patient ID: [name] [name] male   DOB: [birth year] 52 y.o.   MRN: [id number]  HPI: [name] is a 52 y.o. male with PMH signficant as outlined below who presented to the clinic for a follow up BP after nebivolol was increased from 10 mg to 20 mg in October. Patient reports most of the time he is doing ok except there are occasion when he feels dizzy/ light headad when stands up to fast. Last episode was yesterday. He denies any spinning, nausea /vomiting, chest pain or SOB with it but just feels funny in head. He denies any syncope.      Past Medical History  Diagnosis Date  . HLD (hyperlipidemia)   . Lupus     Reported to be dx in 1998 but markers were repeated 2012 Dr Nickola Major and were noraml. This is likely an innacruate dx carried forward by the bloody EMR.   . RA (rheumatoid arthritis)   . Hypertension   . Heart murmur   . Pneumonia 10/2011   Current Outpatient Prescriptions  Medication Sig Dispense Refill  . acetaminophen (TYLENOL) 500 MG tablet Take 1 tablet (500 mg total) by mouth every 6 (six) hours as needed for pain or fever (or Fever >/= 101).  60 tablet  0  . guaiFENesin (MUCINEX) 600 MG 12 hr tablet Take 1,200 mg by mouth 2 (two) times daily.      . Multiple Vitamin (MULITIVITAMIN WITH MINERALS) TABS Take 1 tablet by mouth daily.      . nebivolol (BYSTOLIC) 10 MG tablet Take 2 tablets (20 mg total) by mouth daily.  60 tablet  6  . predniSONE (DELTASONE) 10 MG tablet When you are discharged home, please follow the following taper of your prednisone. Please begin taper according to your date of discharge. -- prednisone 20 mg (2 tablets) on the morning of 6/2  -- prednisone 10 mg (1 tablet) on the morning of 6/3  -- prednisone 5 mg (1/2 tablet) on the morning of 6/4, and continue this dose until changed by your rheumatologist. Prescription is for 1 month's supply  18 tablet  0   No current facility-administered medications for this visit.   Family History   Problem Relation Age of Onset  . Stroke Neg Hx   . Cancer Neg Hx    History   Social History  . Marital Status: Single    Spouse Name: N/A    Number of Children: N/A  . Years of Education: N/A   Social History Main Topics  . Smoking status: Current Some Day Smoker -- 1.00 packs/day for 25 years    Types: Cigarettes    Last Attempt to Quit: 11/06/2011  . Smokeless tobacco: Never Used     Comment: "Ready to give tem up"  . Alcohol Use: No  . Drug Use: No  . Sexually Active: None   Other Topics Concern  . None   Social History Narrative   Patient lives by himself, is on disability since 1992, used to work with city of [place]. 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 Smokes 1/2 PPD and has been smoking since 25 years. Drinks only occasionally.         Review of Systems: Constitutional: Denies fever, chills, diaphoresis, appetite change and fatigue.  HEENT: Denies photophobia, , hearing loss, ear pain, congestion,  tinnitus.   Respiratory: Denies SOB, DOE, cough, chest tightness,  and wheezing.   Cardiovascular: Denies chest pain, palpitations and leg swelling.  Gastrointestinal: Denies nausea, vomiting,  abdominal pain, diarrhea, constipation, blood in stool and abdominal distention.   Neurological: noted  dizziness,  light-headedness, but denies syncope, weakness, numbness and headaches.   Objective:  Physical Exam: Filed Vitals:   08/06/12 0919  BP: 114/78  Pulse: 86  Temp: 97.2 F (36.2 C)  TempSrc: Oral  Height: 5\' 8"  (1.727 m)  Weight: 173 lb 3.2 oz (78.563 kg)  SpO2: 96%   Constitutional: Vital signs reviewed.  Patient is a well-developed and well-nourished male  in no acute distress and cooperative with exam. Alert and oriented x3.  Head: Normocephalic and atraumatic Eyes: PERRL, EOMI, conjunctivae normal, No scleral icterus.  Neck: Supple,  Cardiovascular: RRR, S1 normal, S2 normal, no MRG, pulses symmetric and intact bilaterally Pulmonary/Chest: CTAB, no wheezes,  rales, or rhonchi Abdominal: Soft. Non-tender, non-distended, bowel sounds are normal

## 2012-08-06 NOTE — Assessment & Plan Note (Signed)
Likely due to blood pressure medication although patient is not orthostatic today . Patient noted that he feels good today. Patient was further noted to be anemic in the past. Last CBC in 11/2011. I will obtain CBC today

## 2012-08-06 NOTE — Assessment & Plan Note (Addendum)
I will decrease Bystolic to 10 mg daily with concern that low BP may contributing to Dizziness

## 2012-08-06 NOTE — Patient Instructions (Signed)
Take only one tablet of your blood pressure medication  ( Nebivolol)

## 2012-08-06 NOTE — Assessment & Plan Note (Addendum)
In the setting of Dizziness and history of Anemia I will obtain a CBC today.   Patient should undergo colonoscopy. He has refused it in the past. Discussion with PCP.

## 2012-08-20 ENCOUNTER — Ambulatory Visit (INDEPENDENT_AMBULATORY_CARE_PROVIDER_SITE_OTHER): Payer: Medicare HMO | Admitting: Internal Medicine

## 2012-08-20 ENCOUNTER — Encounter: Payer: Self-pay | Admitting: Internal Medicine

## 2012-08-20 VITALS — BP 123/88 | HR 79 | Temp 97.0°F | Resp 20 | Ht 68.0 in | Wt 172.3 lb

## 2012-08-20 DIAGNOSIS — I10 Essential (primary) hypertension: Secondary | ICD-10-CM

## 2012-08-20 DIAGNOSIS — M25519 Pain in unspecified shoulder: Secondary | ICD-10-CM

## 2012-08-20 DIAGNOSIS — F17200 Nicotine dependence, unspecified, uncomplicated: Secondary | ICD-10-CM

## 2012-08-20 DIAGNOSIS — G8929 Other chronic pain: Secondary | ICD-10-CM

## 2012-08-20 DIAGNOSIS — M25511 Pain in right shoulder: Secondary | ICD-10-CM | POA: Insufficient documentation

## 2012-08-20 DIAGNOSIS — J869 Pyothorax without fistula: Secondary | ICD-10-CM

## 2012-08-20 HISTORY — DX: Pain in right shoulder: M25.511

## 2012-08-20 NOTE — Assessment & Plan Note (Signed)
Ongoing tobacco abuse but has cut down significantly. Counseling and handout were given today. Patient is ready to quit

## 2012-08-20 NOTE — Patient Instructions (Addendum)
Stop taking Aleve every 4 hours.

## 2012-08-20 NOTE — Assessment & Plan Note (Signed)
I will continue systolic 10 mg daily for now BP Readings from Last 3 Encounters:  08/20/12 123/88  08/06/12 115/82  04/08/12 136/94

## 2012-08-20 NOTE — Assessment & Plan Note (Addendum)
Patient complains about uncontrolled chronic left shoulder pain followed by pain clinic.  Xray of left shoulder in 02/2011 was negative for any acute process Reviewing the chart patient has rheumatoid arthritis and lupus in his problem list.  Patient was referred to rheumatology in the past and per records all lab  in 2012 were negative. I will defer further workup by PCP.

## 2012-08-20 NOTE — Progress Notes (Signed)
Subjective:   Patient ID: [name] [name] male   DOB: 1961-05-01 52 y.o.   MRN: [id number]  HPI: [name] is a 52 y.o. male with past medical history significant as outlined below who presented to the clinic for a followup. Patient was evaluated 2 weeks ago for  Dizziness . It was deemed that he was related to hypotension. Bisystolic was decreased from 10 mg to 10 mg during the last office visit. Patient reports to me that he feels a lot better. The dizziness has resolved completely. Today his main concern that his pain in the right shoulder it is not controlled and he is experiencing worsening pain in his left lung area. He is currently taking 400 mg Aleve every 4 hours to control his pain. He is followed up by pain clinic for left shoulder pain. Patient reports that the pain is not controlled. He would receive shots and after one week the pain recurs. He is wondering why the pain clinic is not obtaining any records and x-rays from the clinic.    Past Medical History  Diagnosis Date  . HLD (hyperlipidemia)   . Lupus     Reported to be dx in 1998 but markers were repeated 2012 Dr [name] [name] and were noraml. This is likely an innacruate dx carried forward by the bloody EMR.   . RA (rheumatoid arthritis)   . Hypertension   . Heart murmur   . Pneumonia 10/2011   Current Outpatient Prescriptions  Medication Sig Dispense Refill  . HYDROcodone-acetaminophen (NORCO) 7.5-325 MG per tablet       . Multiple Vitamin (MULITIVITAMIN WITH MINERALS) TABS Take 1 tablet by mouth daily.      . nebivolol (BYSTOLIC) 10 MG tablet Take 1 tablet (10 mg total) by mouth daily.  60 tablet  3   No current facility-administered medications for this visit.   Family History  Problem Relation Age of Onset  . Stroke Neg Hx   . Cancer Neg Hx    History   Social History  . Marital Status: Single    Spouse Name: N/A    Number of Children: N/A  . Years of Education: N/A   Social History Main Topics  . Smoking  status: Current Some Day Smoker -- 0.10 packs/day for 25 years    Types: Cigarettes    Last Attempt to Quit: 11/06/2011  . Smokeless tobacco: Never Used     Comment: "Ready to give tem up"- trying to cut back on his own  . Alcohol Use: No  . Drug Use: No  . Sexually Active: Not on file   Other Topics Concern  . 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 Smokes 1/2 PPD and has been smoking since 25 years. Drinks only occasionally.         Review of Systems: Constitutional: Denies fever, chills, diaphoresis, appetite change and fatigue.   Respiratory: Denies SOB, DOE, cough, chest tightness,  and wheezing.   Cardiovascular: Denies chest pain, palpitations and leg swelling.  Gastrointestinal: Denies nausea, vomiting, abdominal pain, diarrhea, constipation,  Skin: Denies pallor, rash and wound.  Neurological: Denies dizziness, syncope,numbness and headaches.    Objective:  Physical Exam: Filed Vitals:   08/20/12 0904  BP: 123/88  Pulse: 79  Temp: 97 F (36.1 C)  TempSrc: Oral  Resp: 20  Height: 5\' 8"  (1.727 m)  Weight: 172  lb 4.8 oz (78.155 kg)  SpO2: 98%   Constitutional: Vital signs reviewed.  Patient is a well-developed and well-nourished male in no acute distress and cooperative with exam. Alert and oriented x3.  Eyes: PERRL, EOMI, conjunctivae normal, No scleral icterus.  Neck: Supple,  Cardiovascular: RRR, S1 normal, S2 normal, no MRG, pulses symmetric and intact bilaterally Pulmonary/Chest: CTAB, no wheezes, rales, or rhonchi Abdominal: Soft. Non-tender, non-distended, bowel sounds are normal,  Hematology: no cervical adenopathy.  Neurological: A&O x3, Strength is normal and symmetric bilaterally, cranial nerve II-XII are grossly intact, no focal motor deficit, sensory intact to light touch bilaterally. Romberg negative. Finger-to- nose and   Heel-to-shin normal . Gait normal.

## 2012-08-20 NOTE — Assessment & Plan Note (Signed)
Patient had history of empyema of right pleural space status post VATS-decortication on 10/2011. Patient reports he continues to have worsening foot pain. I'll refer patient back to cardiothoracic surgeon for further evaluation and management.

## 2012-09-24 ENCOUNTER — Other Ambulatory Visit: Payer: Self-pay | Admitting: *Deleted

## 2012-09-24 DIAGNOSIS — L7682 Other postprocedural complications of skin and subcutaneous tissue: Secondary | ICD-10-CM

## 2012-09-24 DIAGNOSIS — J90 Pleural effusion, not elsewhere classified: Secondary | ICD-10-CM

## 2012-09-25 ENCOUNTER — Ambulatory Visit
Admission: RE | Admit: 2012-09-25 | Discharge: 2012-09-25 | Disposition: A | Discharge status: Home / Self Care | Payer: Medicare HMO | Source: Ambulatory Visit | Attending: Cardiothoracic Surgery | Admitting: Cardiothoracic Surgery

## 2012-09-25 ENCOUNTER — Ambulatory Visit (INDEPENDENT_AMBULATORY_CARE_PROVIDER_SITE_OTHER): Payer: Medicare HMO | Admitting: Cardiothoracic Surgery

## 2012-09-25 ENCOUNTER — Encounter: Payer: Self-pay | Admitting: Cardiothoracic Surgery

## 2012-09-25 VITALS — BP 130/90 | HR 108 | Resp 20 | Ht 68.0 in | Wt 172.0 lb

## 2012-09-25 DIAGNOSIS — R209 Unspecified disturbances of skin sensation: Secondary | ICD-10-CM

## 2012-09-25 DIAGNOSIS — Z9889 Other specified postprocedural states: Secondary | ICD-10-CM

## 2012-09-25 DIAGNOSIS — J90 Pleural effusion, not elsewhere classified: Secondary | ICD-10-CM

## 2012-09-25 DIAGNOSIS — L7682 Other postprocedural complications of skin and subcutaneous tissue: Secondary | ICD-10-CM

## 2012-09-25 DIAGNOSIS — J869 Pyothorax without fistula: Secondary | ICD-10-CM

## 2012-09-25 NOTE — Progress Notes (Signed)
PCP is Kristie Cowman, MD Referring Provider is Joines, Pollyann Kennedy, MD  Chief Complaint  Patient presents with  . Back Pain    c/o right sided upper back pain at thoracotomy site, pain is worse when lying flat in bed, trouble sleeping, S/P RT VATS and drainage of empyema on 11/21/11    HPI: 9 months after right VATS thoracotomy and decortication of empyema. Patient has persistent postthoracotomy symptoms and wants to be checked out. He is still not smoking. No shortness of breath.  Chest x-ray shows no effusion no infiltrate, lungs clear  Past Medical History  Diagnosis Date  . HLD (hyperlipidemia)   . Lupus     Reported to be dx in 1998 but markers were repeated 2012 Dr Nickola Major and were noraml. This is likely an innacruate dx carried forward by the bloody EMR.   . RA (rheumatoid arthritis)   . Hypertension   . Heart murmur   . Pneumonia 10/2011    Past Surgical History  Procedure Laterality Date  . Fracture surgery      plate in R arm  . Right arm      plate    Family History  Problem Relation Age of Onset  . Stroke Neg Hx   . Cancer Neg Hx     Social History History  Substance Use Topics  . Smoking status: Current Some Day Smoker -- 0.10 packs/day for 25 years    Types: Cigarettes    Last Attempt to Quit: 11/06/2011  . Smokeless tobacco: Never Used     Comment: "Ready to give tem up"- trying to cut back on his own  . Alcohol Use: No    Current Outpatient Prescriptions  Medication Sig Dispense Refill  . Aspirin-Salicylamide-Caffeine (BC HEADACHE POWDER PO) Take by mouth as needed.      [name] HYDROcodone-acetaminophen (NORCO) 7.5-325 MG per tablet       . Multiple Vitamin (MULITIVITAMIN WITH MINERALS) TABS Take 1 tablet by mouth daily.      . naproxen sodium (ANAPROX) 220 MG tablet Take 220 mg by mouth 2 (two) times daily with a meal.      . nebivolol (BYSTOLIC) 10 MG tablet Take 1 tablet (10 mg total) by mouth daily.  60 tablet  3   No current  facility-administered medications for this visit.    Allergies  Allergen Reactions  . Ramipril (Ramipril) Rash  . Tramadol Itching and Rash    Review of Systems denies fever  productive cough discomfort over right chest and shoulder at night mainly patient seen a pain specialist for left neck and shoulder pain BP 130/90  Pulse 108  Resp 20  Ht 5\' 8"  (1.727 m)  Wt 172 lb (78.019 kg)  BMI 26.16 kg/m2  SpO2 96% Physical Exam Alert and comfortable Breath sounds clear and equal Right VATS incision well-healed Heart rhythm regular  Diagnostic Tests: Chest x-ray clear  Impression: Mild postthoracotomy pain. Patient reassured.  Plan: Return as needed.

## 2012-11-26 ENCOUNTER — Other Ambulatory Visit (HOSPITAL_COMMUNITY): Payer: Self-pay | Admitting: Internal Medicine

## 2012-11-27 ENCOUNTER — Ambulatory Visit (INDEPENDENT_AMBULATORY_CARE_PROVIDER_SITE_OTHER): Payer: Medicare HMO | Admitting: Internal Medicine

## 2012-11-27 ENCOUNTER — Encounter: Payer: Self-pay | Admitting: Internal Medicine

## 2012-11-27 VITALS — BP 138/91 | HR 92 | Temp 97.1°F | Ht 66.25 in | Wt 166.7 lb

## 2012-11-27 DIAGNOSIS — I10 Essential (primary) hypertension: Secondary | ICD-10-CM

## 2012-11-27 DIAGNOSIS — M069 Rheumatoid arthritis, unspecified: Secondary | ICD-10-CM

## 2012-11-27 LAB — BASIC METABOLIC PANEL WITH GFR
BUN: 17 mg/dL (ref 6–23)
CO2: 26 mEq/L (ref 19–32)
Chloride: 108 mEq/L (ref 96–112)
Creat: 0.8 mg/dL (ref 0.50–1.35)
Glucose, Bld: 98 mg/dL (ref 70–99)

## 2012-11-27 MED ORDER — PREDNISONE 5 MG PO TABS: 5.0000 mg | ORAL_TABLET | Freq: Every day | ORAL | Status: DC

## 2012-11-27 MED ORDER — NAPROXEN 250 MG PO TABS: 250.0000 mg | ORAL_TABLET | Freq: Two times a day (BID) | ORAL | Status: DC

## 2012-11-27 NOTE — Assessment & Plan Note (Addendum)
Patient reports multiples joints pain and wanted to be referred to a different pain clinic. " I go to a pain clinic at [place] for my [place]". Denies fever, chills, nightsweat, chect pain, chest pressure, SOB, n/v abd pain.   No B symptoms. No s/s infection or septic joints.  Physical examination reveals chronic B/L symmetric joints deformity noted involving shoulders, elbows, wrists, MCPs and PIPs, MTPs. No erythema, swelling, warmth to touch or tenderness. Limited ROM.  Uncontrolled RA and lost follow up. He is not on any DMARDs treatment now.   - will treat with NSAIDs and prednisone for acute inflammation    Naproxen 250 mg po bid x 2 wks--repeat BMP today    Prednisone 5 mg po daily x 2 weeks - will call his previous Rheumatologist Dr. Dareen Piano for a follow up appt. Patient still has a $60 bill at Dr. Ewell Poe office. He states that he wil be able to pay for in next 2 weeks.  He refused to see any other Rheumatologist in town.  - will follow up in 2 weeks - patient is instructed to call in a few days for follow up.

## 2012-11-27 NOTE — Patient Instructions (Addendum)
1. Will give you Naproxen 250 mg po BID x 2 weeks 2. Will give you prednisone 5 mg po daily x 2 weeks 3. Call me in a few days if your pain is not better 4. Follow up with me in 2 weeks. 5. Will contact Dr. Ewell Poe office for an appt. May need to send referral again since your last appt was a few years ago.

## 2012-11-27 NOTE — Progress Notes (Signed)
Subjective:   Patient ID: [name] [name] male   DOB: [birth year] 52 y.o.   MRN: [id number]  HPI: [name] is a 52 y.o. man with PMH of lupus, RA, HTN and HLD who presents the clinic for multiple joints pain.   Patient reports multiple joints pain for over one months. Denies fver, chills, chest pain, chest pressure, SOB, N/V, abdominal pain, weakness, numbness and tingling. He is noted to have ~ 20+ years Lupus and RA, which were followed by his Rheumatologist Dr. Dareen Piano [facility]) and Dr. Lendon [facility]). He is not on DMARDs therapy at present. He states that his last OV with both doctors were at least 2 years ago.    Past Medical History  Diagnosis Date  . HLD (hyperlipidemia)   . Lupus     Reported to be dx in 1998 but markers were repeated 2012 Dr Nickola Major and were noraml. This is likely an innacruate dx carried forward by the bloody EMR.   . RA (rheumatoid arthritis)   . Hypertension   . Heart murmur   . Pneumonia 10/2011   Current Outpatient Prescriptions  Medication Sig Dispense Refill  . Multiple Vitamin (MULITIVITAMIN WITH MINERALS) TABS Take 1 tablet by mouth daily.      . nebivolol (BYSTOLIC) 10 MG tablet Take 1 tablet (10 mg total) by mouth daily.  60 tablet  3  . HYDROcodone-acetaminophen (NORCO) 7.5-325 MG per tablet        No current facility-administered medications for this visit.   Family History  Problem Relation Age of Onset  . Stroke Neg Hx   . Cancer Neg Hx    History   Social History  . Marital Status: Single    Spouse Name: N/A    Number of Children: N/A  . Years of Education: N/A   Social History Main Topics  . Smoking status: Current Some Day Smoker -- 0.50 packs/day for 25 years    Types: Cigarettes    Last Attempt to Quit: 11/06/2011  . Smokeless tobacco: Never Used     Comment: "Ready to give tem up"- trying to cut back on his own  . Alcohol Use: No  . Drug Use: No  . Sexually Active: None   Other Topics Concern  . 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 Smokes 1/2 PPD and has been smoking since 25 years. Drinks only occasionally.         Review of Systems: Review of Systems:  Constitutional:  Denies fever, chills, diaphoresis, appetite change and fatigue.   HEENT:  Denies congestion, sore throat, rhinorrhea, sneezing, mouth sores, trouble swallowing, neck pain   Respiratory:  Denies SOB, DOE, cough, and wheezing.   Cardiovascular:  Denies palpitations and leg swelling.   Gastrointestinal:  Denies nausea, vomiting, abdominal pain, diarrhea, constipation, blood in stool and abdominal distention.   Genitourinary:  Denies dysuria, urgency, frequency, hematuria, flank pain and difficulty urinating.   Musculoskeletal:  Positive for multiple joints pain and deformity.    Skin:  Denies pallor, rash and wound.   Neurological:  Denies dizziness, seizures, syncope, weakness, light-headedness, numbness and headaches.    .    Objective:  Physical Exam: Filed Vitals:   11/27/12 0913  BP: 138/91  Pulse: 92  Temp: 97.1 F (36.2 C)  TempSrc: Oral  Height: 5' 6.25" (1.683 m)  Weight: 166 lb 11.2 oz (  75.615 kg)  SpO2: 98%   General: alert, well-developed, and cooperative to examination.  Head: normocephalic and atraumatic.  Eyes: vision grossly intact, pupils equal, pupils round, pupils reactive to light, no injection and anicteric.  Mouth: pharynx pink and moist, no erythema, and no exudates.  Neck: supple, full ROM, no thyromegaly, no JVD, and no carotid bruits.  Lungs: normal respiratory effort, no accessory muscle use, normal breath sounds, no crackles, and no wheezes. Heart: normal rate, regular rhythm, no murmur, no gallop, and no rub.  Abdomen: soft, non-tender, normal bowel sounds, no distention, no guarding, no rebound tenderness, no hepatomegaly, and no splenomegaly.  Msk: chronic B/L  symmetric joints deformity noted involving shoulders, elbows, wrists, MCPs and PIPs, MTPs. No erythema, swelling, warmth to touch or tenderness. Limited ROM.  Pulses: 2+ DP/PT pulses bilaterally Extremities: No cyanosis, clubbing, edema Neurologic: alert & oriented X3, cranial nerves II-XII intact, strength normal in all extremities, sensation intact to light touch, and gait normal.  Skin: turgor normal and no rashes.  Psych: Oriented X3, memory intact for recent and remote, normally interactive, good eye contact, not anxious appearing, and not depressed appearing.    Assessment & Plan:

## 2012-12-09 NOTE — Progress Notes (Signed)
Office notes faxed to Dr Ewell Poe office for appt - sent to [facility] - new pt info. Stanton Kidney Guy Seese RN 12/09/12 9:30AM

## 2013-01-25 ENCOUNTER — Other Ambulatory Visit: Payer: Self-pay | Admitting: Internal Medicine

## 2013-01-29 ENCOUNTER — Emergency Department (HOSPITAL_COMMUNITY)
Admission: EM | Admit: 2013-01-29 | Discharge: 2013-01-29 | Disposition: A | Discharge status: Home / Self Care | Payer: Medicare HMO | Attending: Emergency Medicine | Admitting: Emergency Medicine

## 2013-01-29 ENCOUNTER — Encounter (HOSPITAL_COMMUNITY): Payer: Self-pay | Admitting: Emergency Medicine

## 2013-01-29 ENCOUNTER — Emergency Department (HOSPITAL_COMMUNITY): Payer: Medicare HMO

## 2013-01-29 DIAGNOSIS — Z8639 Personal history of other endocrine, nutritional and metabolic disease: Secondary | ICD-10-CM | POA: Insufficient documentation

## 2013-01-29 DIAGNOSIS — Y929 Unspecified place or not applicable: Secondary | ICD-10-CM | POA: Insufficient documentation

## 2013-01-29 DIAGNOSIS — R011 Cardiac murmur, unspecified: Secondary | ICD-10-CM | POA: Insufficient documentation

## 2013-01-29 DIAGNOSIS — Y939 Activity, unspecified: Secondary | ICD-10-CM | POA: Insufficient documentation

## 2013-01-29 DIAGNOSIS — Z8701 Personal history of pneumonia (recurrent): Secondary | ICD-10-CM | POA: Insufficient documentation

## 2013-01-29 DIAGNOSIS — F17200 Nicotine dependence, unspecified, uncomplicated: Secondary | ICD-10-CM | POA: Insufficient documentation

## 2013-01-29 DIAGNOSIS — W2209XA Striking against other stationary object, initial encounter: Secondary | ICD-10-CM | POA: Insufficient documentation

## 2013-01-29 DIAGNOSIS — I10 Essential (primary) hypertension: Secondary | ICD-10-CM | POA: Insufficient documentation

## 2013-01-29 DIAGNOSIS — S6991XA Unspecified injury of right wrist, hand and finger(s), initial encounter: Secondary | ICD-10-CM

## 2013-01-29 DIAGNOSIS — Z8739 Personal history of other diseases of the musculoskeletal system and connective tissue: Secondary | ICD-10-CM | POA: Insufficient documentation

## 2013-01-29 DIAGNOSIS — M069 Rheumatoid arthritis, unspecified: Secondary | ICD-10-CM | POA: Insufficient documentation

## 2013-01-29 DIAGNOSIS — Z862 Personal history of diseases of the blood and blood-forming organs and certain disorders involving the immune mechanism: Secondary | ICD-10-CM | POA: Insufficient documentation

## 2013-01-29 DIAGNOSIS — S6990XA Unspecified injury of unspecified wrist, hand and finger(s), initial encounter: Secondary | ICD-10-CM | POA: Insufficient documentation

## 2013-01-29 DIAGNOSIS — S59909A Unspecified injury of unspecified elbow, initial encounter: Secondary | ICD-10-CM | POA: Insufficient documentation

## 2013-01-29 MED ORDER — HYDROCODONE-ACETAMINOPHEN 5-325 MG PO TABS
1.0000 | ORAL_TABLET | Freq: Once | ORAL | Status: AC
  Administered 2013-01-29: 1 via ORAL
  Filled 2013-01-29: qty 1

## 2013-01-29 MED ORDER — NAPROXEN 500 MG PO TABS: 500.0000 mg | ORAL_TABLET | Freq: Two times a day (BID) | ORAL | Status: DC

## 2013-01-29 NOTE — ED Notes (Signed)
Pt c/o right wrist pain after board fell on wrist; pt sts can not bend wrist up with out assistance; CMS intact

## 2013-01-29 NOTE — ED Provider Notes (Signed)
History  This chart was scribed for non-physician practitioner, Fayrene Helper PA-C,  working with Juliet Rude. Rubin Payor, MD by Ardeen Jourdain, ED Scribe. This patient was seen in room TR07C/TR07C and the patient's care was started at 1933.  CSN: [id number]     Arrival date & time 01/29/13  1842  None    Chief Complaint  Patient presents with  . Wrist Pain    The history is provided by the patient. No language interpreter was used.    HPI Comments: [name] [name] is a 52 y.o. male who presents to the Emergency Department complaining of sudden onset, gradually worsening, constant right wrist pain with associated swelling, weakness and intermittent numbness. Pt states the pain began yesterday after a wooden 2x12 fell and pt was trying to grab the board. He states he immediately felt pain after the accident. He states he cannot bend his wrist upwards with out assistance. He states the pain is aggravated by twisting. He states the wrist is very sore to touch. He rates the pain at a 6/10. He denies any tingling in the hand. He denies any other injuries.    Past Medical History  Diagnosis Date  . HLD (hyperlipidemia)   . Lupus     Reported to be dx in 1998 but markers were repeated 2012 Dr Nickola Major and were noraml. This is likely an innacruate dx carried forward by the bloody EMR.   . RA (rheumatoid arthritis)   . Hypertension   . Heart murmur   . Pneumonia 10/2011   Past Surgical History  Procedure Laterality Date  . Fracture surgery      plate in R arm  . Right arm      plate   Family History  Problem Relation Age of Onset  . Stroke Neg Hx   . Cancer Neg Hx    History  Substance Use Topics  . Smoking status: Current Some Day Smoker -- 0.50 packs/day for 25 years    Types: Cigarettes    Last Attempt to Quit: 11/06/2011  . Smokeless tobacco: Never Used     Comment: "Ready to give tem up"- trying to cut back on his own  . Alcohol Use: No    Review of Systems  Musculoskeletal:        Right wrist pain   All other systems reviewed and are negative.    Allergies  Ramipril and Tramadol  Home Medications   Current Outpatient Rx  Name  Route  Sig  Dispense  Refill  . acetaminophen (TYLENOL) 500 MG tablet   Oral   Take 1,000 mg by mouth every 6 (six) hours as needed for pain.         . naproxen sodium (ANAPROX) 220 MG tablet   Oral   Take 440 mg by mouth daily as needed (for pain).         . nebivolol (BYSTOLIC) 5 MG tablet   Oral   Take 5 mg by mouth 2 (two) times daily.          Triage Vitals: BP 117/76  Pulse 87  Temp(Src) 98 F (36.7 C) (Oral)  Resp 16  SpO2 97%  Physical Exam  Nursing note and vitals reviewed. Constitutional: He is oriented to person, place, and time. He appears well-developed and well-nourished. No distress.  HENT:  Head: Normocephalic and atraumatic.  Eyes: EOM are normal. Pupils are equal, round, and reactive to light.  Neck: Normal range of motion. Neck supple. No tracheal deviation present.  Cardiovascular: Normal rate.   Pulmonary/Chest: Effort normal. No respiratory distress.  Abdominal: Soft. He exhibits no distension.  Musculoskeletal: Normal range of motion. He exhibits no edema.  Tenderness to dorsum of right wrist. Right wrist in flexed position. Pt unable to extend wrist completely. Normal wrist flexion. Pt having difficulty with pronation and supination. Slightly decreased grip strength. Normal sensation. Radial pulse 2+. No deformity   Neurological: He is alert and oriented to person, place, and time.  Skin: Skin is warm and dry.  Psychiatric: He has a normal mood and affect. His behavior is normal.    ED Course   Procedures (including critical care time)  DIAGNOSTIC STUDIES: Oxygen Saturation is 97% on room air, normal by my interpretation.    COORDINATION OF CARE:  8:28 PM-Discussed treatment plan which includes x-ray of the right wrist, brace, pain medication and follow up with a hand specialist with  pt at bedside and pt agreed to plan.   8:45 PM i suspect pt may have an extensor tendon injury from grabbing the wooden board suddenly.  Pt likely need MRI for further evaluation.  Wrist brace provide, hand specialist referral given.  Return precaution discussed.  Naproxen for pain.   Labs Reviewed - No data to display Dg Wrist Complete Right  01/29/2013   *RADIOLOGY REPORT*  Clinical Data: Wrist pain  RIGHT WRIST - COMPLETE 3+ VIEW  Comparison: 10/27/2010  Findings: Four views of the right wrist submitted.  No acute fracture or subluxation. There is degenerative narrowing of radiocarpal joint.  Mild sclerosis of the articular surface of distal radius.  Mild cystic degenerative changes are noted lunate bone. Mild degenerative changes with sclerosis anterior articular surface of the lunate mid carpal region.  IMPRESSION: No acute fracture or subluxation. There is degenerative narrowing of radiocarpal joint.  Mild sclerosis of the articular surface of distal radius.  Mild cystic degenerative changes are noted lunate bone. Mild degenerative changes with sclerosis anterior articular surface of the lunate mid carpal region.   Original Report Authenticated By: Natasha Mead, M.D.   1. Right wrist injury, initial encounter     MDM  BP 117/76  Pulse 87  Temp(Src) 98 F (36.7 C) (Oral)  Resp 16  SpO2 97%  I have reviewed nursing notes and vital signs. I personally reviewed the imaging tests through PACS system  I reviewed available ER/hospitalization records thought the EMR  I personally performed the services described in this documentation, which was scribed in my presence. The recorded information has been reviewed and is accurate.     Fayrene Helper, PA-C 01/29/13 2048

## 2013-01-31 NOTE — ED Provider Notes (Signed)
Medical screening examination/treatment/procedure(s) were performed by non-physician practitioner and as supervising physician I was immediately available for consultation/collaboration.  Brittini Brubeck R. Dalon Reichart, MD 01/31/13 0019 

## 2013-02-13 ENCOUNTER — Other Ambulatory Visit: Payer: Self-pay | Admitting: *Deleted

## 2013-02-13 NOTE — Telephone Encounter (Signed)
Pt has appointment Monday for RA/lupus, swelling of knees Pt # [phone]

## 2013-02-14 MED ORDER — NAPROXEN 500 MG PO TABS: 500.0000 mg | ORAL_TABLET | Freq: Two times a day (BID) | ORAL | Status: DC

## 2013-02-14 MED ORDER — PREDNISONE 5 MG PO TABS: ORAL_TABLET | ORAL | Status: DC

## 2013-02-17 ENCOUNTER — Ambulatory Visit (INDEPENDENT_AMBULATORY_CARE_PROVIDER_SITE_OTHER): Payer: Medicare HMO | Admitting: Internal Medicine

## 2013-02-17 ENCOUNTER — Encounter: Payer: Medicare HMO | Admitting: Internal Medicine

## 2013-02-17 ENCOUNTER — Encounter: Payer: Self-pay | Admitting: Internal Medicine

## 2013-02-17 VITALS — HR 77 | Temp 98.2°F | Ht 68.0 in | Wt 148.3 lb

## 2013-02-17 DIAGNOSIS — M069 Rheumatoid arthritis, unspecified: Secondary | ICD-10-CM

## 2013-02-17 NOTE — Patient Instructions (Addendum)
Please keep your next appointment at 03/31/2013 3:15 PM Dr. Bosie Clos  1. We will make your referral to Dr. Dareen Piano, Rheumatology

## 2013-02-17 NOTE — Progress Notes (Signed)
Patient ID: [name] [name], male   DOB: May 31, 1961, 52 y.o.   MRN: [id number]     Subjective:   Patient ID: [name] [name] male   DOB: 15-Nov-1960 52 y.o.   MRN: [id number]  HPI: [name] is a 52 y.o. here for a referral to rheumatology.  He has a PMH of Lupus and RA.  Please see problem based assessment and plan below for further details.    Past Medical History  Diagnosis Date  . HLD (hyperlipidemia)   . Lupus     Reported to be dx in 1998 but markers were repeated 2012 Dr Nickola Major and were noraml. This is likely an innacruate dx carried forward by the bloody EMR.   . RA (rheumatoid arthritis)   . Hypertension   . Heart murmur   . Pneumonia 10/2011   Current Outpatient Prescriptions  Medication Sig Dispense Refill  . acetaminophen (TYLENOL) 500 MG tablet Take 1,000 mg by mouth every 6 (six) hours as needed for pain.      . naproxen (NAPROSYN) 500 MG tablet Take 1 tablet (500 mg total) by mouth 2 (two) times daily.  30 tablet  0  . nebivolol (BYSTOLIC) 5 MG tablet Take 5 mg by mouth 2 (two) times daily.      . predniSONE (DELTASONE) 5 MG tablet TAKE 1 TABLET BY MOUTH DAILY  14 tablet  0   No current facility-administered medications for this visit.   Family History  Problem Relation Age of Onset  . Stroke Neg Hx   . Cancer Neg Hx    History   Social History  . Marital Status: Single    Spouse Name: N/A    Number of Children: N/A  . Years of Education: N/A   Social History Main Topics  . Smoking status: Current Some Day Smoker -- 0.50 packs/day for 25 years    Types: Cigarettes    Last Attempt to Quit: 11/06/2011  . Smokeless tobacco: Never Used     Comment: "Ready to give tem up"- trying to cut back on his own  . Alcohol Use: No  . Drug Use: No  . Sexual Activity: None   Other Topics Concern  . None   Social History Narrative   Patient lives by himself, is on disability since 1992, used to work with [employer] [place]. 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 Smokes 1/2 PPD and  has been smoking since 25 years. Drinks only occasionally.         Review of Systems:  Pertinent items are noted in HPI.  Objective:  Physical Exam: Filed Vitals:   02/17/13 1555  Pulse: 77  Temp: 98.2 F (36.8 C)  TempSrc: Oral  Height: 5\' 8"  (1.727 m)  Weight: 148 lb 4.8 oz (67.268 kg)  SpO2: 98%   Physical Exam  Constitutional: He is oriented to person, place, and time and well-developed, well-nourished, and in no distress.  HENT:  Head: Normocephalic and atraumatic.  Eyes: Conjunctivae and EOM are normal. Pupils are equal, round, and reactive to light.  Cardiovascular: Normal rate, regular rhythm, normal heart sounds and intact distal pulses.   Pulmonary/Chest: Effort normal and breath sounds normal.  Abdominal: Soft. Bowel sounds are normal.  Musculoskeletal: Normal range of motion.       Right hand: Normal. He exhibits no deformity.       Left hand: Normal. He exhibits no deformity.  Neurological: He is alert and oriented to person, place, and time.  Skin: Skin is warm and dry.  Assessment & Plan:

## 2013-02-17 NOTE — Assessment & Plan Note (Addendum)
Pt is here for a referral for rheumatology, Dr. Dareen Piano, for RA and Lupus.  He reports some right foot swelling and pain but no other complaints today.  It appears pt was receiving pain meds from the [facility] but violated the contract and now wants to see a rheumatologist for RA/Lupus and pain medication.  Nothing remarkable noted on physical exam-no joint deformity or swelling appreciated.    -currently on APAP, NSAIDs;  prednisone 5mg  po daily for 2 weeks for acute inflammation  -referral for Rheumatologist Dr. Dareen Piano -f/u with Dr. Bosie Clos on 03/31/13

## 2013-02-17 NOTE — Telephone Encounter (Signed)
Naproxen called in.

## 2013-02-18 NOTE — Progress Notes (Signed)
I saw and evaluated the patient.  I personally confirmed the key portions of the history and exam documented by Dr. Gill and I reviewed pertinent patient test results.  The assessment, diagnosis, and plan were formulated together and I agree with the documentation in the resident's note. 

## 2013-02-18 NOTE — Addendum Note (Signed)
Addended by: Aletta Edouard on: 02/18/2013 08:51 AM   Modules accepted: Level of Service

## 2013-02-27 ENCOUNTER — Telehealth: Payer: Self-pay | Admitting: *Deleted

## 2013-02-27 NOTE — Telephone Encounter (Signed)
Pt called and would like for lela to call him about his referral to dr Dareen Piano for arthritis

## 2013-03-06 ENCOUNTER — Other Ambulatory Visit: Payer: Self-pay | Admitting: Internal Medicine

## 2013-03-31 ENCOUNTER — Encounter: Payer: Medicare HMO | Admitting: Internal Medicine

## 2013-03-31 ENCOUNTER — Encounter: Payer: Self-pay | Admitting: Internal Medicine

## 2013-05-20 ENCOUNTER — Other Ambulatory Visit (HOSPITAL_COMMUNITY): Payer: Self-pay | Admitting: Internal Medicine

## 2013-08-13 ENCOUNTER — Other Ambulatory Visit (HOSPITAL_COMMUNITY): Payer: Self-pay | Admitting: Internal Medicine

## 2013-09-09 ENCOUNTER — Encounter (HOSPITAL_COMMUNITY): Payer: Self-pay | Admitting: Emergency Medicine

## 2013-09-09 ENCOUNTER — Emergency Department (HOSPITAL_COMMUNITY)
Admission: EM | Admit: 2013-09-09 | Discharge: 2013-09-09 | Disposition: A | Discharge status: Home / Self Care | Payer: Medicare HMO | Attending: Emergency Medicine | Admitting: Emergency Medicine

## 2013-09-09 DIAGNOSIS — F17200 Nicotine dependence, unspecified, uncomplicated: Secondary | ICD-10-CM | POA: Insufficient documentation

## 2013-09-09 DIAGNOSIS — M069 Rheumatoid arthritis, unspecified: Secondary | ICD-10-CM | POA: Insufficient documentation

## 2013-09-09 DIAGNOSIS — I10 Essential (primary) hypertension: Secondary | ICD-10-CM | POA: Diagnosis not present

## 2013-09-09 DIAGNOSIS — IMO0002 Reserved for concepts with insufficient information to code with codable children: Secondary | ICD-10-CM | POA: Diagnosis not present

## 2013-09-09 DIAGNOSIS — Z8701 Personal history of pneumonia (recurrent): Secondary | ICD-10-CM | POA: Diagnosis not present

## 2013-09-09 DIAGNOSIS — K047 Periapical abscess without sinus: Secondary | ICD-10-CM | POA: Diagnosis not present

## 2013-09-09 DIAGNOSIS — E785 Hyperlipidemia, unspecified: Secondary | ICD-10-CM | POA: Diagnosis not present

## 2013-09-09 DIAGNOSIS — K089 Disorder of teeth and supporting structures, unspecified: Secondary | ICD-10-CM | POA: Diagnosis present

## 2013-09-09 DIAGNOSIS — M329 Systemic lupus erythematosus, unspecified: Secondary | ICD-10-CM | POA: Insufficient documentation

## 2013-09-09 DIAGNOSIS — R011 Cardiac murmur, unspecified: Secondary | ICD-10-CM | POA: Insufficient documentation

## 2013-09-09 DIAGNOSIS — K029 Dental caries, unspecified: Secondary | ICD-10-CM

## 2013-09-09 DIAGNOSIS — Z79899 Other long term (current) drug therapy: Secondary | ICD-10-CM | POA: Diagnosis not present

## 2013-09-09 MED ORDER — HYDROCODONE-ACETAMINOPHEN 5-325 MG PO TABS: 1.0000 | ORAL_TABLET | Freq: Four times a day (QID) | ORAL | Status: DC | PRN

## 2013-09-09 MED ORDER — PENICILLIN V POTASSIUM 500 MG PO TABS: 500.0000 mg | ORAL_TABLET | Freq: Four times a day (QID) | ORAL | Status: AC

## 2013-09-09 MED ORDER — IBUPROFEN 800 MG PO TABS: 800.0000 mg | ORAL_TABLET | Freq: Three times a day (TID) | ORAL | Status: DC

## 2013-09-09 NOTE — ED Provider Notes (Signed)
Medical screening examination/treatment/procedure(s) were performed by non-physician practitioner and as supervising physician I was immediately available for consultation/collaboration.   EKG Interpretation None        Ezequiel Essex, MD 09/09/13 2340

## 2013-09-09 NOTE — ED Notes (Signed)
Reports left lower dental pain since yesterday. Airway intact.

## 2013-09-09 NOTE — Discharge Instructions (Signed)
Ibuprofen for pain. Norco for severe pain. Penicillin for infection until all gone. Follow up with a dentist or an oral surgeon in 1 week.    Abscessed Tooth An abscessed tooth is an infection around your tooth. It may be caused by holes or damage to the tooth (cavity) or a dental disease. An abscessed tooth causes mild to very bad pain in and around the tooth. See your dentist right away if you have tooth or gum pain. HOME CARE  Take your medicine as told. Finish it even if you start to feel better.  Do not drive after taking pain medicine.  Rinse your mouth (gargle) often with salt water ( teaspoon salt in 8 ounces of warm water).  Do not apply heat to the outside of your face. GET HELP RIGHT AWAY IF:   You have a temperature by mouth above 102 F (38.9 C), not controlled by medicine.  You have chills and a very bad headache.  You have problems breathing or swallowing.  Your mouth will not open.  You develop puffiness (swelling) on the neck or around the eye.  Your pain is not helped by medicine.  Your pain is getting worse instead of better. MAKE SURE YOU:   Understand these instructions.  Will watch your condition.  Will get help right away if you are not doing well or get worse. Document Released: 11/29/2007 Document Revised: 09/04/2011 Document Reviewed: 09/20/2010 [facility] Patient Information 2014 [place].

## 2013-09-09 NOTE — ED Provider Notes (Signed)
CSN: [id number]     Arrival date & time 09/09/13  1619 History  This chart was scribed for non-physician practitioner, Jeannett Senior, PA-C working with Ezequiel Essex, MD by Frederich Balding, ED scribe. This patient was seen in room TR04C/TR04C and the patient's care was started at 6:23 PM.   Chief Complaint  Patient presents with  . Dental Pain   The history is provided by the patient. No language interpreter was used.   HPI Comments: [name] [name] is a 53 y.o. male who presents to the Emergency Department complaining of gradual onset, constant left lower dental pain with associated facial swelling that started yesterday around 4 AM. He states there is an abscess around the area. Chewing and opening his mouth worsen the pain. Denies fever. History of multiple dental abscesses in the past. States "i have bad teeth and waiting on medicaid to see a dentist."  Past Medical History  Diagnosis Date  . HLD (hyperlipidemia)   . Lupus     Reported to be dx in 1998 but markers were repeated 2012 Dr Trudie Reed and were noraml. This is likely an innacruate dx carried forward by the bloody EMR.   . RA (rheumatoid arthritis)   . Hypertension   . Heart murmur   . Pneumonia 10/2011   Past Surgical History  Procedure Laterality Date  . Fracture surgery      plate in R arm  . Right arm      plate   Family History  Problem Relation Age of Onset  . Stroke Neg Hx   . Cancer Neg Hx    History  Substance Use Topics  . Smoking status: Current Some Day Smoker -- 0.50 packs/day for 25 years    Types: Cigarettes    Last Attempt to Quit: 11/06/2011  . Smokeless tobacco: Never Used     Comment: "Ready to give tem up"- trying to cut back on his own  . Alcohol Use: No    Review of Systems  Constitutional: Negative for fever.  HENT: Positive for dental problem and facial swelling.   All other systems reviewed and are negative.   Allergies  Ramipril and Tramadol  Home Medications   Current  Outpatient Rx  Name  Route  Sig  Dispense  Refill  . etanercept (ENBREL) 50 MG/ML injection   Subcutaneous   Inject 50 mg into the skin once a week. Friday         . nebivolol (BYSTOLIC) 5 MG tablet   Oral   Take 5 mg by mouth 2 (two) times daily.         . predniSONE (DELTASONE) 20 MG tablet   Oral   Take 20 mg by mouth daily with breakfast.          BP 133/82  Pulse 90  Temp(Src) 98.1 F (36.7 C) (Oral)  Resp 20  Ht 5\' 9"  (1.753 m)  Wt 160 lb (72.576 kg)  BMI 23.62 kg/m2  SpO2 97%  Physical Exam  Nursing note and vitals reviewed. Constitutional: He is oriented to person, place, and time. He appears well-developed and well-nourished. No distress.  HENT:  Head: Normocephalic and atraumatic.  Mostly denture-less with multiple teeth eroded to the gumline. Left lower lateral incisor erode to the gumline with surrounding gum inflammation and swelling. Tender to palpation. No trismus. No swelling under the tongue.   Eyes: EOM are normal.  Neck: Neck supple. No tracheal deviation present.  Cardiovascular: Normal rate.   Pulmonary/Chest: Effort normal.  No respiratory distress.  Musculoskeletal: Normal range of motion.  Neurological: He is alert and oriented to person, place, and time.  Skin: Skin is warm and dry.  Psychiatric: He has a normal mood and affect. His behavior is normal.    ED Course  Procedures (including critical care time)  DIAGNOSTIC STUDIES: Oxygen Saturation is 97% on RA, normal by my interpretation.    COORDINATION OF CARE: 6:25 PM-Discussed treatment plan which includes an antibiotic and pain medication with pt at bedside and pt agreed to plan. Advised pt to follow up with a dentist.   Labs Review Labs Reviewed - No data to display Imaging Review No results found.   EKG Interpretation None      MDM   Final diagnoses:  Dental abscess  Dental decay    Patient with poor dentition, most teeth eroded to the gumline. There is some  swelling around his left lower lateral incisor. Will start on penicillin, ibuprofen for pain, Norco for severe pain. I have given him referral to Dr. Benson Norway with oral surgery. He will need to followup as soon as able. Patient is otherwise afebrile, nontoxic appearing. No signs of Ludwig's angina, no swelling under the tongue, no trismus. Stable for outpatient therapy.  Filed Vitals:   09/09/13 1632  BP: 133/82  Pulse: 90  Temp: 98.1 F (36.7 C)  TempSrc: Oral  Resp: 20  Height: 5\' 9"  (1.753 m)  Weight: 160 lb (72.576 kg)  SpO2: 97%    I personally performed the services described in this documentation, which was scribed in my presence. The recorded information has been reviewed and is accurate.   Renold Genta, PA-C 09/09/13 1832

## 2013-09-11 ENCOUNTER — Encounter: Payer: Self-pay | Admitting: Internal Medicine

## 2013-09-16 ENCOUNTER — Encounter: Payer: Self-pay | Admitting: Internal Medicine

## 2013-09-22 ENCOUNTER — Encounter: Payer: Medicare HMO | Admitting: Internal Medicine

## 2013-11-02 ENCOUNTER — Encounter (HOSPITAL_COMMUNITY): Payer: Self-pay | Admitting: Emergency Medicine

## 2013-11-02 ENCOUNTER — Emergency Department (HOSPITAL_COMMUNITY)
Admission: EM | Admit: 2013-11-02 | Discharge: 2013-11-02 | Disposition: A | Discharge status: Home / Self Care | Payer: Medicare HMO | Attending: Emergency Medicine | Admitting: Emergency Medicine

## 2013-11-02 DIAGNOSIS — K047 Periapical abscess without sinus: Secondary | ICD-10-CM

## 2013-11-02 DIAGNOSIS — M069 Rheumatoid arthritis, unspecified: Secondary | ICD-10-CM | POA: Insufficient documentation

## 2013-11-02 DIAGNOSIS — Z8639 Personal history of other endocrine, nutritional and metabolic disease: Secondary | ICD-10-CM | POA: Insufficient documentation

## 2013-11-02 DIAGNOSIS — K044 Acute apical periodontitis of pulpal origin: Secondary | ICD-10-CM | POA: Insufficient documentation

## 2013-11-02 DIAGNOSIS — Z8701 Personal history of pneumonia (recurrent): Secondary | ICD-10-CM | POA: Insufficient documentation

## 2013-11-02 DIAGNOSIS — F17200 Nicotine dependence, unspecified, uncomplicated: Secondary | ICD-10-CM | POA: Insufficient documentation

## 2013-11-02 DIAGNOSIS — IMO0002 Reserved for concepts with insufficient information to code with codable children: Secondary | ICD-10-CM | POA: Insufficient documentation

## 2013-11-02 DIAGNOSIS — R011 Cardiac murmur, unspecified: Secondary | ICD-10-CM | POA: Insufficient documentation

## 2013-11-02 DIAGNOSIS — I10 Essential (primary) hypertension: Secondary | ICD-10-CM | POA: Insufficient documentation

## 2013-11-02 DIAGNOSIS — Z791 Long term (current) use of non-steroidal anti-inflammatories (NSAID): Secondary | ICD-10-CM | POA: Insufficient documentation

## 2013-11-02 DIAGNOSIS — Z862 Personal history of diseases of the blood and blood-forming organs and certain disorders involving the immune mechanism: Secondary | ICD-10-CM | POA: Insufficient documentation

## 2013-11-02 DIAGNOSIS — Z79899 Other long term (current) drug therapy: Secondary | ICD-10-CM | POA: Insufficient documentation

## 2013-11-02 MED ORDER — HYDROCODONE-ACETAMINOPHEN 5-325 MG PO TABS
1.0000 | ORAL_TABLET | Freq: Once | ORAL | Status: AC
  Administered 2013-11-02: 1 via ORAL
  Filled 2013-11-02: qty 1

## 2013-11-02 MED ORDER — PENICILLIN V POTASSIUM 500 MG PO TABS: 500.0000 mg | ORAL_TABLET | Freq: Four times a day (QID) | ORAL | Status: DC

## 2013-11-02 MED ORDER — IBUPROFEN-FAMOTIDINE 800-26.6 MG PO TABS: 1.0000 | ORAL_TABLET | Freq: Three times a day (TID) | ORAL | Status: DC

## 2013-11-02 NOTE — ED Notes (Signed)
Pt in from home c/o dental pain mid lower dental pain, states, "I went to the Dental clinic the May 5th & I don't have an appointment until June 1st to remove them all. I started having pain Friday." A&O x4, follows commands, speaks in complete sentences

## 2013-11-02 NOTE — ED Provider Notes (Signed)
CSN: [id number]     Arrival date & time 11/02/13  1000 History  This chart was scribed for non-physician practitioner, Antonietta Breach, PA-C working with Neta Ehlers, MD by Frederich Balding, ED scribe. This patient was seen in room TR05C/TR05C and the patient's care was started at 10:30 AM.   Chief Complaint  Patient presents with  . Dental Pain   The history is provided by the patient. No language interpreter was used.   HPI Comments: [name] [name] is a 53 y.o. male who presents to the Emergency Department complaining of gradual onset, right lower dental pain that started 2 days ago. He has associated right sided facial swelling. Pt went to a dental clinic on 10/28/13 but does not have an appointment to get his teeth removed until 11/24/13. Pt is not currently on antibiotics. He has tried Orajel with no relief but not taken any medications. Denies pus drainage, inability to open his mouth, trouble swallowing, drooling.   Oral surgeon - Dr. Benson Norway  Past Medical History  Diagnosis Date  . HLD (hyperlipidemia)   . Lupus     Reported to be dx in 1998 but markers were repeated 2012 Dr Trudie Reed and were noraml. This is likely an innacruate dx carried forward by the bloody EMR.   . RA (rheumatoid arthritis)   . Hypertension   . Heart murmur   . Pneumonia 10/2011   Past Surgical History  Procedure Laterality Date  . Fracture surgery      plate in R arm  . Right arm      plate   Family History  Problem Relation Age of Onset  . Stroke Neg Hx   . Cancer Neg Hx    History  Substance Use Topics  . Smoking status: Current Some Day Smoker -- 0.50 packs/day for 25 years    Types: Cigarettes  . Smokeless tobacco: Never Used     Comment: "Ready to give tem up"- trying to cut back on his own  . Alcohol Use: No    Review of Systems  HENT: Positive for dental problem and facial swelling. Negative for drooling and trouble swallowing.   All other systems reviewed and are negative.   Allergies   Ramipril and Tramadol  Home Medications   Prior to Admission medications   Medication Sig Start Date End Date Taking? Authorizing Provider  etanercept (ENBREL) 50 MG/ML injection Inject 50 mg into the skin once a week. Friday    Historical Provider, MD  HYDROcodone-acetaminophen (NORCO) 5-325 MG per tablet Take 1 tablet by mouth every 6 (six) hours as needed for moderate pain. 09/09/13   Tatyana A Kirichenko, PA-C  ibuprofen (ADVIL,MOTRIN) 800 MG tablet Take 1 tablet (800 mg total) by mouth 3 (three) times daily. 09/09/13   Tatyana A Kirichenko, PA-C  nebivolol (BYSTOLIC) 5 MG tablet Take 5 mg by mouth 2 (two) times daily.    Historical Provider, MD  predniSONE (DELTASONE) 20 MG tablet Take 20 mg by mouth daily with breakfast.    Historical Provider, MD   BP 144/99  Pulse 107  Temp(Src) 97.9 F (36.6 C) (Oral)  Wt 164 lb (74.39 kg)  SpO2 97%  Physical Exam  Nursing note and vitals reviewed. Constitutional: He is oriented to person, place, and time. He appears well-developed and well-nourished. No distress.  Nontoxic/nonseptic appearing  HENT:  Head: Normocephalic and atraumatic.  Mouth/Throat: Oropharynx is clear and moist. No oropharyngeal exudate.  Tolerating secretions without difficulty. Mostly denture-less with multiple teeth eroded  to the gumline. Gingival swelling and erythema as well as inflammation without purulent drainage or fluctuance. Mild tenderness to palpation to lower gingiva. No trismus. No facial swelling.   Eyes: Conjunctivae and EOM are normal. No scleral icterus.  Neck: Normal range of motion.  Cardiovascular: Normal rate, regular rhythm and normal heart sounds.   Pulmonary/Chest: Effort normal and breath sounds normal. No respiratory distress. He has no wheezes. He has no rales.  Musculoskeletal: Normal range of motion.  Neurological: He is alert and oriented to person, place, and time.  Skin: Skin is warm and dry. No rash noted. He is not diaphoretic. No  erythema. No pallor.  Psychiatric: He has a normal mood and affect. His behavior is normal.    ED Course  Procedures (including critical care time)  DIAGNOSTIC STUDIES: Oxygen Saturation is 97% on RA, normal by my interpretation.    COORDINATION OF CARE: 10:35 AM-Discussed treatment plan which includes an antibiotic with pt at bedside and pt agreed to plan. Advised pt to try to get an earlier appointment for his extractions.   Labs Review Labs Reviewed - No data to display  Imaging Review No results found.   EKG Interpretation None      MDM   Final diagnoses:  Dental infection    Patient with dentalgia x 3 days. Physical exam findings consistent with dental infection given gingival erythema, swelling, and tenderness. No area of fluctuance to suggest gross abscess. Uvula midline without evidence of peritonsillar abscess. No trismus or stridor. Exam unconcerning for Ludwig's angina or spread of infection. Will treat with penicillin and pain medicine.  Urged patient to follow-up with oral surgeon. Patient agreeable to plan with no unaddressed concerns.  I personally performed the services described in this documentation, which was scribed in my presence. The recorded information has been reviewed and is accurate.  Antonietta Breach, PA-C 11/02/13 1057

## 2013-11-02 NOTE — Discharge Instructions (Signed)
Take penicillin as prescribed. Recommend Aleve for pain control. You may take to [facility] if Aleve causes you stomach upset. Followup with your oral surgeon as needed. Return if symptoms worsen.  Periodontal Disease Periodontal disease, or gum disease, is a type of oral disease that affects the surrounding and supporting tissues of the teeth. These include the gums (gingivae), ligaments, and tooth socket (alveolar bone). Periodontal disease can affect one tooth or many teeth. If left untreated, it may lead to tooth loss.  CAUSES The main cause of periodontal disease is dental plaque, which contains harmful bacteria. These bacteria can cause the gums to become inflamed and infected. Further progression of the disease can damage the other supporting tissues.  RISK FACTORS  Diabetes.   Smoking and tobacco use.   Genetics.   Hormonal changes of puberty, menopause, and pregnancy.   Stress.   Clenching or grinding your teeth.   Substance abuse.  Poor nutrition.   Diseases that interfere with the body's immune system.   Certain medicines. SIGNS AND SYMPTOMS  Red or swollen gums.  Bad breath that does not go away.  Gums that have pulled away from the teeth.  Gums that bleed easily.  Permanent teeth that are loose or separating.  Pain when chewing.  Changes in the way your teeth fit together.  Sensitive teeth. DIAGNOSIS  A thorough examination of the periodontal tissues will be done by your dentist. X-rays may be needed. Evaluation of your medical history will be needed to see if there are other factors or underlying conditions that may contribute to the disease. TREATMENT The number and types of treatment will vary depending on the extent of the disease. Treatment may include brushing and flossing only. Further disease progression may necessitate scaling and root planing or even surgery. The main goal is to control the infection. Good oral hygiene at home is necessary for  the success of all types of treatment. HOME CARE INSTRUCTIONS   Practice good oral hygiene. This includes flossing and brushing your teeth every day.   See your dentist regularly, at least 2 times per year.   Stop smoking if you smoke.  Eat a well-balanced diet. SEEK IMMEDIATE DENTAL CARE IF:   You have any signs or symptoms of periodontal disease along with:  Swelling of your face, neck, or jaw.  Inability to open your mouth.  Severe pain uncontrolled by pain medicine.  You have a fever or persistent symptoms for more than 2 3 days.  You have a fever and your symptoms suddenly get worse. Document Released: 06/15/2003 Document Revised: 02/12/2013 Document Reviewed: 11/19/2012 [facility] Patient Information 2014 [place].

## 2013-11-03 NOTE — ED Provider Notes (Signed)
Medical screening examination/treatment/procedure(s) were conducted as a shared visit with non-physician practitioner(s) and myself.  I personally evaluated the patient during the encounter.  Pt presents w/ several days of dental pain/lower facial swelling.  No tenderness or induration of sublingual or submental tissues. Will start on abx and have him f/u with his oral surgeon.  Return precautions given for new or worsening symptoms including s/sx Ludwig's angina.    EKG Interpretation None        Neta Ehlers, MD 11/03/13 1623

## 2013-11-24 ENCOUNTER — Other Ambulatory Visit (HOSPITAL_COMMUNITY): Payer: Self-pay | Admitting: Internal Medicine

## 2014-01-23 ENCOUNTER — Telehealth: Payer: Self-pay | Admitting: *Deleted

## 2014-01-23 MED ORDER — NEBIVOLOL HCL 5 MG PO TABS: 5.0000 mg | ORAL_TABLET | Freq: Every day | ORAL | Status: DC

## 2014-01-23 NOTE — Telephone Encounter (Signed)
Attempted to call pt, mobile is not in service, male answered at home, stated he was not there and she would give him a message, she is ask to have him call clinic by Monday 8/3

## 2014-01-23 NOTE — Telephone Encounter (Signed)
Please schedule an appointment within 1 month.

## 2014-03-12 ENCOUNTER — Ambulatory Visit: Payer: Medicare HMO | Admitting: Internal Medicine

## 2014-03-24 ENCOUNTER — Ambulatory Visit: Payer: Medicare HMO | Admitting: Internal Medicine

## 2014-03-24 ENCOUNTER — Other Ambulatory Visit: Payer: Self-pay | Admitting: Internal Medicine

## 2014-04-21 ENCOUNTER — Encounter (HOSPITAL_COMMUNITY): Payer: Self-pay | Admitting: Emergency Medicine

## 2014-04-21 ENCOUNTER — Emergency Department (HOSPITAL_COMMUNITY)
Admission: EM | Admit: 2014-04-21 | Discharge: 2014-04-21 | Disposition: A | Discharge status: Home / Self Care | Payer: No Typology Code available for payment source | Attending: Emergency Medicine | Admitting: Emergency Medicine

## 2014-04-21 ENCOUNTER — Emergency Department (HOSPITAL_COMMUNITY): Payer: No Typology Code available for payment source

## 2014-04-21 DIAGNOSIS — S46912A Strain of unspecified muscle, fascia and tendon at shoulder and upper arm level, left arm, initial encounter: Secondary | ICD-10-CM | POA: Diagnosis not present

## 2014-04-21 DIAGNOSIS — S3992XA Unspecified injury of lower back, initial encounter: Secondary | ICD-10-CM | POA: Diagnosis not present

## 2014-04-21 DIAGNOSIS — S46911A Strain of unspecified muscle, fascia and tendon at shoulder and upper arm level, right arm, initial encounter: Secondary | ICD-10-CM | POA: Diagnosis not present

## 2014-04-21 DIAGNOSIS — S161XXA Strain of muscle, fascia and tendon at neck level, initial encounter: Secondary | ICD-10-CM | POA: Diagnosis not present

## 2014-04-21 DIAGNOSIS — R011 Cardiac murmur, unspecified: Secondary | ICD-10-CM | POA: Insufficient documentation

## 2014-04-21 DIAGNOSIS — Y9389 Activity, other specified: Secondary | ICD-10-CM | POA: Diagnosis not present

## 2014-04-21 DIAGNOSIS — E785 Hyperlipidemia, unspecified: Secondary | ICD-10-CM | POA: Insufficient documentation

## 2014-04-21 DIAGNOSIS — Z8739 Personal history of other diseases of the musculoskeletal system and connective tissue: Secondary | ICD-10-CM | POA: Insufficient documentation

## 2014-04-21 DIAGNOSIS — S199XXA Unspecified injury of neck, initial encounter: Secondary | ICD-10-CM | POA: Diagnosis present

## 2014-04-21 DIAGNOSIS — T148XXA Other injury of unspecified body region, initial encounter: Secondary | ICD-10-CM

## 2014-04-21 DIAGNOSIS — Y92410 Unspecified street and highway as the place of occurrence of the external cause: Secondary | ICD-10-CM | POA: Diagnosis not present

## 2014-04-21 DIAGNOSIS — Z8701 Personal history of pneumonia (recurrent): Secondary | ICD-10-CM | POA: Insufficient documentation

## 2014-04-21 DIAGNOSIS — Z79899 Other long term (current) drug therapy: Secondary | ICD-10-CM | POA: Diagnosis not present

## 2014-04-21 DIAGNOSIS — Z720 Tobacco use: Secondary | ICD-10-CM | POA: Diagnosis not present

## 2014-04-21 DIAGNOSIS — I10 Essential (primary) hypertension: Secondary | ICD-10-CM | POA: Diagnosis not present

## 2014-04-21 MED ORDER — METHOCARBAMOL 500 MG PO TABS: 1000.0000 mg | ORAL_TABLET | Freq: Four times a day (QID) | ORAL | Status: DC
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
## 2014-04-23 NOTE — ED Provider Notes (Signed)
Medical screening examination/treatment/procedure(s) were performed by non-physician practitioner and as supervising physician I was immediately available for consultation/collaboration.   Dot Lanes, MD 04/23/14 [phone]

## 2014-04-24 ENCOUNTER — Other Ambulatory Visit: Payer: Self-pay | Admitting: Internal Medicine

## 2014-04-24 NOTE — Telephone Encounter (Signed)
Pt has not been seen since 02/17/13 He has a scheduled appointment 11/16 No PCP at this time

## 2014-04-24 NOTE — Telephone Encounter (Signed)
Giving 1 months supply. Reassessment when the patient is seen on 11/16.

## 2014-05-11 ENCOUNTER — Encounter: Payer: Self-pay | Admitting: Internal Medicine

## 2014-05-11 ENCOUNTER — Encounter: Payer: Medicare HMO | Admitting: Internal Medicine

## 2014-05-11 ENCOUNTER — Ambulatory Visit (INDEPENDENT_AMBULATORY_CARE_PROVIDER_SITE_OTHER): Payer: Medicare HMO | Admitting: Internal Medicine

## 2014-05-11 VITALS — BP 143/97 | HR 88 | Temp 98.0°F | Ht 68.4 in | Wt 171.3 lb

## 2014-05-11 DIAGNOSIS — I10 Essential (primary) hypertension: Secondary | ICD-10-CM

## 2014-05-11 DIAGNOSIS — M62830 Muscle spasm of back: Secondary | ICD-10-CM

## 2014-05-11 DIAGNOSIS — M069 Rheumatoid arthritis, unspecified: Secondary | ICD-10-CM

## 2014-05-11 MED ORDER — METHOCARBAMOL 500 MG PO TABS: 1000.0000 mg | ORAL_TABLET | Freq: Four times a day (QID) | ORAL | Status: DC | PRN
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
## 2014-05-11 NOTE — Assessment & Plan Note (Signed)
Ulnar deviation and swelling of R hand at base of index finger. No active flares of arthritic pain. He says he is scheduled to see his rheumatologist Dr Gavin Pound tomorrow 11/17 at 9:30 am. -cont prednisone 20 mg daily and etanercept 50 mg inj qweek per rheumatology
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
## 2014-05-11 NOTE — Progress Notes (Signed)
Patient ID: [name] [name], male   DOB: 11/12/1960, 53 y.o.   MRN: [id number] [facility] Clinic Attending  I saw and evaluated the patient.  I personally confirmed the key portions of the history and exam documented by Dr. Ethelene Hal and I reviewed pertinent patient test results.  The assessment, diagnosis, and plan were formulated together and I agree with the documentation in the resident's note.

## 2014-05-11 NOTE — Patient Instructions (Addendum)
It was a pleasure to see you today. We have given you a new prescription for robaxin. Please return to clinic or seek medical attention if you have any new or worsening back pain, joint pain, or other worrisome medical condition. We look forward to seeing you again in six months.  Lottie Mussel, MD  General Instructions:   Please bring your medicines with you each time you come to clinic.  Medicines may include prescription medications, over-the-counter medications, herbal remedies, eye drops, vitamins, or other pills.   Progress Toward Treatment Goals:  No flowsheet data found.  Self Care Goals & Plans:  Self Care Goal 05/11/2014  Manage my medications take my medicines as prescribed; bring my medications to every visit; refill my medications on time  Monitor my health -  Eat healthy foods drink diet soda or water instead of juice or soda; eat more vegetables; eat foods that are low in salt; eat baked foods instead of fried foods; eat fruit for snacks and desserts  Be physically active -    No flowsheet data found.   Care Management & Community Referrals:  No flowsheet data found.

## 2014-05-26 ENCOUNTER — Other Ambulatory Visit: Payer: Self-pay | Admitting: Internal Medicine

## 2014-08-23 ENCOUNTER — Other Ambulatory Visit: Payer: Self-pay | Admitting: Internal Medicine

## 2014-09-01 ENCOUNTER — Telehealth: Payer: Self-pay | Admitting: *Deleted

## 2014-09-02 ENCOUNTER — Ambulatory Visit (INDEPENDENT_AMBULATORY_CARE_PROVIDER_SITE_OTHER): Payer: Medicare HMO | Admitting: Internal Medicine

## 2014-09-02 ENCOUNTER — Encounter: Payer: Self-pay | Admitting: Internal Medicine

## 2014-09-02 VITALS — BP 138/91 | HR 66 | Temp 98.0°F | Ht 68.5 in | Wt 170.4 lb

## 2014-09-02 DIAGNOSIS — M069 Rheumatoid arthritis, unspecified: Secondary | ICD-10-CM | POA: Diagnosis not present

## 2014-09-02 DIAGNOSIS — T8090XA Unspecified complication following infusion and therapeutic injection, initial encounter: Secondary | ICD-10-CM

## 2014-09-02 NOTE — Progress Notes (Signed)
Internal Medicine Clinic Attending  Case discussed with Dr. Eulas Post soon after the resident saw the patient.  We reviewed the resident's history and exam and pertinent patient test results.  I agree with the assessment, diagnosis, and plan of care documented in the resident's note. 2

## 2014-09-02 NOTE — Progress Notes (Signed)
Patient ID: Geovani Tootle, male   DOB: 1960-08-26, 54 y.o.   MRN: [id number]  Subjective:   Patient ID: Amel Gianino male   DOB: 1960/06/29 54 y.o.   MRN: [id number]  HPI: Mr.Gustabo Shankland is a 54 y.o. M w/ PMH HTN, rheumatoid arthritis, and HLD presents for an acute visit.   Pt has a h/o RA and is followed by Dr. Lenna Gilford. He take Enbrel qwk and 20mg  prednisone daily. He has been giving himself Enbrel at home, and states that he has developed an ulceration of his leg at the injection site. He states that the day after givening himself his injection, Saturday, he woke up and noticed a "raw patch" at the injection site. He states that the skin continued to strip off in layers. He endorses mild SOB and a choking feeling since starting the Enbrel. He denies any hives or localized edema.    Past Medical History  Diagnosis Date  . HLD (hyperlipidemia)   . Lupus     Reported to be dx in 1998 but markers were repeated 2012 Dr Trudie Reed and were noraml. This is likely an innacruate dx carried forward by the bloody EMR.   . RA (rheumatoid arthritis)   . Hypertension   . Heart murmur   . Pneumonia 10/2011   Current Outpatient Prescriptions  Medication Sig Dispense Refill  . BYSTOLIC 5 MG tablet TAKE 1 TABLET BY MOUTH EVERY DAY 90 tablet 0  . ENBREL SURECLICK 50 MG/ML injection Inject 50 mg into the skin once a week.    . leflunomide (ARAVA) 10 MG tablet Take 10 mg by mouth daily.    [name] oxyCODONE-acetaminophen (PERCOCET/ROXICET) 5-325 MG per tablet Take 1 tablet by mouth every 6 (six) hours as needed for severe pain.    . methocarbamol (ROBAXIN) 500 MG tablet Take 2 tablets (1,000 mg total) by mouth every 6 (six) hours as needed for muscle spasms. (Patient not taking: Reported on 09/02/2014) 20 tablet 0  . predniSONE (DELTASONE) 20 MG tablet Take 20 mg by mouth daily with breakfast.     No current facility-administered medications for this visit.   Family History  Problem Relation Age of Onset  . Stroke Neg  Hx   . Cancer Neg Hx    History   Social History  . Marital Status: Single    Spouse Name: N/A  . Number of Children: N/A  . Years of Education: N/A   Social History Main Topics  . Smoking status: Current Some Day Smoker -- 0.10 packs/day for 25 years    Types: Cigarettes  . Smokeless tobacco: Never Used     Comment: "Ready to give tem up"- trying to cut back on his own.  2-3 daily  . Alcohol Use: No  . Drug Use: No  . Sexual Activity: Not on file   Other Topics Concern  . None   Social History Narrative   Patient lives by himself, is on disability since 1992, used to work with city of [place]. 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 Smokes 1/2 PPD and has been smoking since 25 years. Drinks only occasionally.         Review of Systems: Constitutional: Denies fever, appetite change, or fatigue.  HEENT: Denies eye pain or vision loss.   Respiratory: +SOB Cardiovascular: Denies chest pain, palpitations, or leg swelling.  Gastrointestinal: Denies nausea, vomiting, + burning abdominal pain Genitourinary: Denies dysuria Musculoskeletal: +shoulder and hand pains  Skin: +Wound on left thigh Neurological: Denies dizziness, syncope, weakness.  Psychiatric/Behavioral:  Denies mood changes or confusion.   Objective:  Physical Exam: Filed Vitals:   09/02/14 0905  BP: 138/91  Pulse: 66  Temp: 98 F (36.7 C)  TempSrc: Oral  Height: 5' 8.5" (1.74 m)  Weight: 170 lb 6.4 oz (77.293 kg)  SpO2: 99%   Constitutional: Vital signs reviewed.  Patient is a well-developed and well-nourished male in no acute distress and cooperative with exam.   Head: Normocephalic and atraumatic Eyes: EOMI. No scleral icterus.  Neck: Normal ROM Cardiovascular: RRR, no MRG Pulmonary/Chest: Normal respiratory effort, CTAB, no wheezes, rales, or rhonchi Abdominal: Soft. Non-tender, non-distended, bowel sounds are normal, no masses, organomegaly, or guarding present.  Musculoskeletal: Ulnar deviation present in  hands. No active inflammation appreciated.  Neurological: A&O x3, cranial nerve II-XII are grossly intact, no focal motor deficit.  Skin: Superficial wound on proximal anterior left thigh. Mild induration, no fluctuance, mildly tender to palpation, no purulence, clean scab on top.   Psychiatric: Normal mood and affect. Speech and behavior is normal.   Assessment & Plan:   Please refer to Problem List based Assessment and Plan  Pt has been going to [facility] and to [facility]. Notified the patient that he cannot have 2 PCPs. He states that he would like to continue his care here and not at [facility]. Having patient sign medical record release form to get records from [facility].

## 2014-09-02 NOTE — Patient Instructions (Signed)
Hold on taking the Enbrel until you talk to Dr. Lenna Gilford. Please call Dr. Lenna Gilford today and let her know of this local reaction that you had with the Enbrel.   Keep working on quitting smoking- great job cutting back!!!     General Instructions:   Please bring your medicines with you each time you come to clinic.  Medicines may include prescription medications, over-the-counter medications, herbal remedies, eye drops, vitamins, or other pills.   Progress Toward Treatment Goals:  Treatment Goal 05/11/2014  Blood pressure deteriorated    Self Care Goals & Plans:  Self Care Goal 09/02/2014  Manage my medications take my medicines as prescribed; bring my medications to every visit; refill my medications on time  Monitor my health -  Eat healthy foods drink diet soda or water instead of juice or soda; eat more vegetables; eat foods that are low in salt; eat baked foods instead of fried foods  Be physically active -    No flowsheet data found.   Care Management & Community Referrals:  No flowsheet data found.

## 2014-09-02 NOTE — Assessment & Plan Note (Signed)
Pt with lesion of the left thigh after injecting Enbrel. He did also endorse some  Lesion is superficial, with minimal induration. Lesion is clean with intact scab, no drainage.  - Pt to hold on next Enbrel dose (q week dosing) which will be Friday.  - He is to contact Dr. Lenna Gilford, his Rheumatologist regarding the reaction

## 2014-09-02 NOTE — Assessment & Plan Note (Signed)
Pt on Enbrel and was recently put on Leflunomide and taken off the prednisone. Local injection site reaction from the Enbrel after last injection on Fri, 08/28/14.  - Pt to hold on the Enbrel and contact his Rheumatologist

## 2014-09-03 NOTE — Telephone Encounter (Signed)
reference

## 2014-10-15 ENCOUNTER — Other Ambulatory Visit: Payer: Self-pay | Admitting: Rheumatology

## 2014-10-15 DIAGNOSIS — M25512 Pain in left shoulder: Secondary | ICD-10-CM

## 2014-10-25 DIAGNOSIS — M7540 Impingement syndrome of unspecified shoulder: Secondary | ICD-10-CM

## 2014-10-25 HISTORY — DX: Impingement syndrome of unspecified shoulder: M75.40

## 2014-10-26 ENCOUNTER — Encounter: Payer: Self-pay | Admitting: *Deleted

## 2014-11-01 ENCOUNTER — Ambulatory Visit
Admission: RE | Admit: 2014-11-01 | Discharge: 2014-11-01 | Disposition: A | Discharge status: Home / Self Care | Payer: Medicare HMO | Source: Ambulatory Visit | Attending: Rheumatology | Admitting: Rheumatology

## 2014-11-01 DIAGNOSIS — M25512 Pain in left shoulder: Secondary | ICD-10-CM

## 2014-11-03 ENCOUNTER — Encounter: Payer: Self-pay | Admitting: *Deleted

## 2014-11-03 ENCOUNTER — Telehealth: Payer: Self-pay | Admitting: *Deleted

## 2014-11-03 NOTE — Telephone Encounter (Signed)
Pt called with c/o not being able to sleep at night.  He just found out he has Rotator Cuff tear s/p MRI. This has been very uncomfortable. Pt seen be Dr Trudie Reed - Rheumatologist   She also writes Rx for [employer]. He also states he takes several Aspirin to help with the pain.  Will see in clinic tomorrow to assess above.

## 2014-11-04 ENCOUNTER — Encounter: Payer: Self-pay | Admitting: Internal Medicine

## 2014-11-04 ENCOUNTER — Ambulatory Visit (INDEPENDENT_AMBULATORY_CARE_PROVIDER_SITE_OTHER): Payer: Medicare HMO | Admitting: Internal Medicine

## 2014-11-04 VITALS — BP 141/95 | HR 67 | Temp 97.7°F | Ht 68.0 in | Wt 165.2 lb

## 2014-11-04 DIAGNOSIS — M75100 Unspecified rotator cuff tear or rupture of unspecified shoulder, not specified as traumatic: Secondary | ICD-10-CM

## 2014-11-04 DIAGNOSIS — M75102 Unspecified rotator cuff tear or rupture of left shoulder, not specified as traumatic: Secondary | ICD-10-CM | POA: Diagnosis not present

## 2014-11-04 HISTORY — DX: Unspecified rotator cuff tear or rupture of unspecified shoulder, not specified as traumatic: M75.100

## 2014-11-04 MED ORDER — OXYCODONE-ACETAMINOPHEN 10-325 MG PO TABS: 1.0000 | ORAL_TABLET | Freq: Four times a day (QID) | ORAL | Status: DC | PRN

## 2014-11-04 MED ORDER — PANTOPRAZOLE SODIUM 40 MG PO TBEC: 40.0000 mg | DELAYED_RELEASE_TABLET | Freq: Two times a day (BID) | ORAL | Status: DC

## 2014-11-04 MED ORDER — CYCLOBENZAPRINE HCL 5 MG PO TABS: 5.0000 mg | ORAL_TABLET | Freq: Three times a day (TID) | ORAL | Status: DC | PRN

## 2014-11-04 MED ORDER — KETOROLAC TROMETHAMINE 30 MG/ML IJ SOLN
30.0000 mg | Freq: Once | INTRAMUSCULAR | Status: AC
  Administered 2014-11-04: 30 mg via INTRAMUSCULAR

## 2014-11-04 NOTE — Progress Notes (Signed)
Subjective:   Patient ID: [name] [name] male   DOB: [birth year] 54 y.o.   MRN: [id number]  HPI: [name] is a 54 y.o. man pmh as listed below presents for trouble sleeping.   Pt has had trouble sleeping for the past week as a result of is rotator cuff pain. Patient had a recent MRI that showed rotator cuff injury, bursitis, and joint deformity. Since then the patient is on Percocet for pain control which is only minimally controlling it for short amount of time. The patient states that he's been supplementing with anywhere between 5-6 aspirins per day, BC powders, Aleve, Tylenol for some joint relief. He states that he went back to his rheumatologist for discussion of a possible increase of his pain medication and was told that he would need to go to pain management clinic before this could be done. The patient has been awaiting his surgical evaluation but the rheumatology office has not contacted him back. The patient is not having trouble initiating sleep but maintaining sleep secondary to being awoken by the pain.  He has been having some epigastric pain as result of all of the aspirin and BC powders that has increasingly worsened over this week. He has not had any hematochezia, hematemesis or bloody stools. He is still able to eat without nausea vomiting or diarrhea. He has been taking intermittent Tums with minimal to no relief.   Past Medical History  Diagnosis Date  . HLD (hyperlipidemia)   . Lupus     Reported to be dx in 1998 but markers were repeated 2012 Dr Trudie Reed and were noraml. This is likely an innacruate dx carried forward by the bloody EMR.   . RA (rheumatoid arthritis)   . Hypertension   . Heart murmur   . Pneumonia 10/2011   Current Outpatient Prescriptions  Medication Sig Dispense Refill  . BYSTOLIC 5 MG tablet TAKE 1 TABLET BY MOUTH EVERY DAY 90 tablet 0  . cyclobenzaprine (FLEXERIL) 5 MG tablet Take 1 tablet (5 mg total) by mouth every 8 (eight) hours as needed for  muscle spasms. 30 tablet 1  . ENBREL SURECLICK 50 MG/ML injection Inject 50 mg into the skin once a week.    . leflunomide (ARAVA) 10 MG tablet Take 10 mg by mouth daily.    . methocarbamol (ROBAXIN) 500 MG tablet Take 2 tablets (1,000 mg total) by mouth every 6 (six) hours as needed for muscle spasms. (Patient not taking: Reported on 09/02/2014) 20 tablet 0  . oxyCODONE-acetaminophen (PERCOCET) 10-325 MG per tablet Take 1 tablet by mouth every 6 (six) hours as needed for pain. 120 tablet 0  . pantoprazole (PROTONIX) 40 MG tablet Take 1 tablet (40 mg total) by mouth 2 (two) times daily. 60 tablet 1   Current Facility-Administered Medications  Medication Dose Route Frequency Provider Last Rate Last Dose  . ketorolac (TORADOL) 30 MG/ML injection 30 mg  30 mg Intramuscular Once Jerrye Noble, MD       Family History  Problem Relation Age of Onset  . Stroke Neg Hx   . Cancer Neg Hx    History   Social History  . Marital Status: Single    Spouse Name: N/A  . Number of Children: N/A  . Years of Education: N/A   Social History Main Topics  . Smoking status: Current Some Day Smoker -- 0.10 packs/day for 25 years    Types: Cigarettes  . Smokeless tobacco: Never Used     Comment: "  Ready to give tem up"- trying to cut back on his own.  2-3 daily  . Alcohol Use: No  . Drug Use: No  . Sexual Activity: Not on file   Other Topics Concern  . None   Social History Narrative   Patient lives by himself, is on disability since 1992, used to work with city of [place]. 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 Smokes 1/2 PPD and has been smoking since 25 years. Drinks only occasionally.         Review of Systems: Pertinent items are noted in HPI. Objective:  Physical Exam: Filed Vitals:   11/04/14 0822  BP: 141/95  Pulse: 67  Temp: 97.7 F (36.5 C)  TempSrc: Oral  Height: 5\' 8"  (1.727 m)  Weight: 165 lb 3.2 oz (74.934 kg)  SpO2: 100%   General: sitting in chair, slightly uncomfortable Cardiac:  RRR, no rubs, murmurs or gallops Pulm: clear to auscultation bilaterally, moving normal volumes of air Abd: soft, nontender, nondistended, BS present Ext: warm and well perfused, no pedal edema Shoulder: limited ROM 2/2 pain, normal sensation, 4/5 left shoulder strength   Assessment & Plan:  Please see problem oriented charting  Pt discussed with Dr. Beryle Beams

## 2014-11-04 NOTE — Assessment & Plan Note (Signed)
Patient has MRI diagnosis that showed rotatory cuff tear, bursitis, and joint deformity. The patient has been unable to make contact with orthopedic surgery for evaluation. Is only having limited pain control with the Percocet 5-3 25 mg. He has been supplement with large amounts of aspirin and other over-the-counter pain medications. This has been preventing him from sleep. -IM Toradol 30 mg in clinic -Increase Percocet to 10-3 25 every 6 hours when necessary enough was given for one-month prescription -Referral to orthopedic surgery -Flexeril when necessary -Protonix 40 mg twice a day for epigastric pain likely induced by aspirin -Patient was educated on warning signs for immediate evaluation regarding worsening abdominal pain nausea vomiting or diarrhea, hematemesis or significant weight loss

## 2014-11-04 NOTE — Patient Instructions (Signed)
General Instructions:   Please bring your medicines with you each time you come to clinic.  Medicines may include prescription medications, over-the-counter medications, herbal remedies, eye drops, vitamins, or other pills.   For your shoulder: -We'll make a referral to the surgeons -We have increased your pain medication -Did not take any more aspirin -Please take the stomach medicine called Protonix twice a day   Progress Toward Treatment Goals:  Treatment Goal 05/11/2014  Blood pressure deteriorated    Self Care Goals & Plans:  Self Care Goal 11/04/2014  Manage my medications take my medicines as prescribed; bring my medications to every visit; refill my medications on time  Monitor my health -  Eat healthy foods drink diet soda or water instead of juice or soda; eat more vegetables; eat foods that are low in salt; eat baked foods instead of fried foods; eat fruit for snacks and desserts  Be physically active -    No flowsheet data found.   Care Management & Community Referrals:  No flowsheet data found.

## 2014-11-04 NOTE — Progress Notes (Signed)
Medicine attending: Medical history, presenting problems, physical findings, and medications, reviewed with Dr Nora Sadek and I concur with her evaluation and management plan. 

## 2014-11-09 ENCOUNTER — Encounter: Payer: Medicare HMO | Admitting: Internal Medicine

## 2014-11-09 ENCOUNTER — Other Ambulatory Visit: Payer: Self-pay | Admitting: Physician Assistant

## 2014-11-16 ENCOUNTER — Encounter (HOSPITAL_BASED_OUTPATIENT_CLINIC_OR_DEPARTMENT_OTHER): Payer: Self-pay | Admitting: *Deleted

## 2014-11-16 NOTE — Progress Notes (Signed)
   11/16/14 1351  OBSTRUCTIVE SLEEP APNEA  Have you ever been diagnosed with sleep apnea through a sleep study? No  Do you snore loudly (loud enough to be heard through closed doors)?  1  Do you often feel tired, fatigued, or sleepy during the daytime? 0  Has anyone observed you stop breathing during your sleep? 0  Do you have, or are you being treated for high blood pressure? 1  BMI more than 35 kg/m2? 0  Age over 55 years old? 1  Gender: 1  Obstructive Sleep Apnea Score 4

## 2014-11-16 NOTE — Pre-Procedure Instructions (Signed)
To come for BMET and EKG 

## 2014-11-18 ENCOUNTER — Encounter (HOSPITAL_BASED_OUTPATIENT_CLINIC_OR_DEPARTMENT_OTHER)
Admission: RE | Admit: 2014-11-18 | Discharge: 2014-11-18 | Disposition: A | Discharge status: Home / Self Care | Payer: Medicare HMO | Source: Ambulatory Visit | Attending: Orthopedic Surgery | Admitting: Orthopedic Surgery

## 2014-11-18 ENCOUNTER — Other Ambulatory Visit: Payer: Self-pay

## 2014-11-18 ENCOUNTER — Encounter (HOSPITAL_BASED_OUTPATIENT_CLINIC_OR_DEPARTMENT_OTHER): Payer: Self-pay | Admitting: *Deleted

## 2014-11-18 DIAGNOSIS — F17213 Nicotine dependence, cigarettes, with withdrawal: Secondary | ICD-10-CM | POA: Diagnosis not present

## 2014-11-18 DIAGNOSIS — I10 Essential (primary) hypertension: Secondary | ICD-10-CM | POA: Diagnosis not present

## 2014-11-18 DIAGNOSIS — M75102 Unspecified rotator cuff tear or rupture of left shoulder, not specified as traumatic: Secondary | ICD-10-CM | POA: Diagnosis present

## 2014-11-18 LAB — BASIC METABOLIC PANEL
Anion gap: 7 (ref 5–15)
BUN: 7 mg/dL (ref 6–20)
CALCIUM: 9.2 mg/dL (ref 8.9–10.3)
CHLORIDE: 108 mmol/L (ref 101–111)
CO2: 28 mmol/L (ref 22–32)
Creatinine, Ser: 0.94 mg/dL (ref 0.61–1.24)
GFR calc Af Amer: >60 mL/min (ref >60)
GFR calc non Af Amer: >60 mL/min (ref >60)
GLUCOSE: 93 mg/dL (ref 65–99)
Potassium: 3.9 mmol/L (ref 3.5–5.1)
SODIUM: 143 mmol/L (ref 135–145)

## 2014-11-18 NOTE — Progress Notes (Signed)
ekg reviewed by dr rose

## 2014-11-18 NOTE — H&P (Signed)
[facility] [address] [place], [place] [address] [phone] A Division of [facility]  Ninetta Lights, M.D.   Robert A. Noemi Chapel, M.D.   Faythe Casa, M.D.   Johnny Bridge, M.D.   Almedia Balls, M.D Ernesta Amble. Percell Miller, M.D.  Joseph Pierini, M.D.  Lanier Prude, M.D.    Verner Chol, M.D. Lovett Calender, PA- C  Mary L. Venida Jarvis, PA-C  Kirstin A. Shepperson, PA-C  Josh [place], PA-C  [place], [place]   RE: [name], [name]                                [id number]      DOB: 1960-09-21 INITIAL EVALUATION:  11-06-14 [name] [name] is a new patient to the office.  Presents for evaluation and treatment recommendation, left shoulder.  In talking with him, he has had longstanding symptoms, more than six months, left shoulder.  Some on the right, but the right is not intolerable.  At some point in time in the not too distant past he ruptured the long head of the biceps tendon on both sides and he has the typical biceps deformity on both sides.  Symptoms on the right are tolerable, but on the left intolerable.  He cannot do anything overhead.  Worse with activity.  Not that much better with rest.  Again, worse in the last six months, but it has really been longer than that in talking with him.  He was seen by his regular physician.  X-rays were obtained, AP view, internal and external rotation.  He also had an MRI scan.  I have gone over those x-rays, as well as the MRI report and scan.  This revealed a picture of bony impingement.  Considerable thinning rotator cuff with significant partial thickness tear supraspinatus with delamination.  Although you can't see a full thickness tear there are marked changes through the supraspinatus laterally.  Reactive bursitis.  Some thinning glenohumeral joint, not extreme.  Chronic ruptured long head biceps.   Remaining history reviewed, updated and included in the chart.  Of note, he is  taking Percocet, trying to use this as infrequently as possible for his shoulder pain.    EXAMINATION: General exam is outlined and included in the chart.  Specifically, 54 year-old male.  Height: 5?8.  Weight: 165 pounds.  Normal gait and stance.  Looking at both shoulders I can get him through just about full motion.  He has positive impingement.  Positive palms down abduction, left much greater than right.  Long head biceps rupture, both sides.  AC soreness both sides.  There is no demonstrable atrophy.  No apprehension or instability.    X-RAYS: I did get a three view x-ray of his left shoulder.  Type II acromion.  Reasonable subacromial space.  Some changes AC joint and glenohumeral joint, nothing extreme.  Of note, on his MRI he does have some cystic areas in the humerus proximally near the cuff attachment.  These look benign and are only seen on MRI, not x-ray.    DISPOSITION:  Longstanding impingement, both shoulders, left much greater than right.  Long head biceps tendon rupture, both sides.  Symptoms not too intolerable on the right.  Intolerable on the left where he is getting rest pain and night pain.  We have discussed definitive treatment.  Exam under anesthesia, arthroscopy, debridement of his shoulder.  Subacromial decompression and distal clavicle excision.  In all likelihood arthroscopically assisted rotator cuff repair.  Procedure, risks, benefits and complications reviewed.  Paperwork complete.  All questions answered.  The amount of time for rehab and recovery outlined.  My concern about an attritional tear of the cuff versus traumatic tear also thoroughly discussed.  Covered all of this with he and his wife.  We are going to set this up in the near future.    Ninetta Lights, M.D.   Electronically verified by Ninetta Lights, M.D. DFM:jjh Cc: [place] Fax: [phone] D 11-06-14 T 11-09-14

## 2014-11-19 ENCOUNTER — Ambulatory Visit (HOSPITAL_BASED_OUTPATIENT_CLINIC_OR_DEPARTMENT_OTHER): Payer: Medicare HMO | Admitting: Anesthesiology

## 2014-11-19 ENCOUNTER — Ambulatory Visit (HOSPITAL_BASED_OUTPATIENT_CLINIC_OR_DEPARTMENT_OTHER)
Admission: RE | Admit: 2014-11-19 | Discharge: 2014-11-19 | Disposition: A | Discharge status: Home / Self Care | Payer: Medicare HMO | Source: Ambulatory Visit | Attending: Orthopedic Surgery | Admitting: Orthopedic Surgery

## 2014-11-19 ENCOUNTER — Encounter (HOSPITAL_BASED_OUTPATIENT_CLINIC_OR_DEPARTMENT_OTHER)
Admission: RE | Disposition: A | Discharge status: Home / Self Care | Payer: Self-pay | Source: Ambulatory Visit | Attending: Orthopedic Surgery

## 2014-11-19 ENCOUNTER — Encounter (HOSPITAL_BASED_OUTPATIENT_CLINIC_OR_DEPARTMENT_OTHER): Payer: Self-pay | Admitting: *Deleted

## 2014-11-19 DIAGNOSIS — I10 Essential (primary) hypertension: Secondary | ICD-10-CM | POA: Insufficient documentation

## 2014-11-19 DIAGNOSIS — F17213 Nicotine dependence, cigarettes, with withdrawal: Secondary | ICD-10-CM | POA: Diagnosis not present

## 2014-11-19 DIAGNOSIS — M75102 Unspecified rotator cuff tear or rupture of left shoulder, not specified as traumatic: Secondary | ICD-10-CM | POA: Diagnosis not present

## 2014-11-19 HISTORY — DX: Gastro-esophageal reflux disease without esophagitis: K21.9

## 2014-11-19 HISTORY — PX: SHOULDER ARTHROSCOPY WITH ROTATOR CUFF REPAIR AND SUBACROMIAL DECOMPRESSION: SHX5686

## 2014-11-19 HISTORY — PX: SHOULDER ARTHROSCOPY WITH DISTAL CLAVICLE RESECTION: SHX5675

## 2014-11-19 HISTORY — DX: Personal history of diseases of the blood and blood-forming organs and certain disorders involving the immune mechanism: Z86.2

## 2014-11-19 HISTORY — DX: Impingement syndrome of unspecified shoulder: M75.40

## 2014-11-19 LAB — POCT HEMOGLOBIN-HEMACUE: Hemoglobin: 14.3 g/dL (ref 13.0–17.0)

## 2014-11-19 SURGERY — SHOULDER ARTHROSCOPY WITH ROTATOR CUFF REPAIR AND SUBACROMIAL DECOMPRESSION
Anesthesia: Regional | Site: Shoulder | Laterality: Left

## 2014-11-19 MED ORDER — LIDOCAINE HCL (CARDIAC) 20 MG/ML IV SOLN
INTRAVENOUS | Status: DC | PRN
  Administered 2014-11-19: 40 mg via INTRAVENOUS

## 2014-11-19 MED ORDER — PROMETHAZINE HCL 25 MG/ML IJ SOLN: 6.2500 mg | INTRAMUSCULAR | Status: DC | PRN

## 2014-11-19 MED ORDER — OXYCODONE-ACETAMINOPHEN 5-325 MG PO TABS: 1.0000 | ORAL_TABLET | ORAL | Status: DC | PRN

## 2014-11-19 MED ORDER — HYDRALAZINE HCL 20 MG/ML IJ SOLN
5.0000 mg | Freq: Once | INTRAMUSCULAR | Status: AC
  Administered 2014-11-19: 5 mg via INTRAVENOUS

## 2014-11-19 MED ORDER — FENTANYL CITRATE (PF) 100 MCG/2ML IJ SOLN
INTRAMUSCULAR | Status: DC | PRN
  Administered 2014-11-19 (×2): 25 ug via INTRAVENOUS

## 2014-11-19 MED ORDER — HYDROMORPHONE HCL 1 MG/ML IJ SOLN
INTRAMUSCULAR | Status: AC
  Filled 2014-11-19: qty 1

## 2014-11-19 MED ORDER — SODIUM CHLORIDE 0.9 % IR SOLN
Status: DC | PRN
  Administered 2014-11-19: 9000 mL

## 2014-11-19 MED ORDER — FENTANYL CITRATE (PF) 100 MCG/2ML IJ SOLN
50.0000 ug | INTRAMUSCULAR | Status: DC | PRN
  Administered 2014-11-19: 50 ug via INTRAVENOUS

## 2014-11-19 MED ORDER — KETOROLAC TROMETHAMINE 30 MG/ML IJ SOLN: 30.0000 mg | Freq: Once | INTRAMUSCULAR | Status: DC | PRN

## 2014-11-19 MED ORDER — MIDAZOLAM HCL 2 MG/2ML IJ SOLN
1.0000 mg | INTRAMUSCULAR | Status: DC | PRN
  Administered 2014-11-19: 2 mg via INTRAVENOUS

## 2014-11-19 MED ORDER — ONDANSETRON HCL 4 MG/2ML IJ SOLN
INTRAMUSCULAR | Status: DC | PRN
  Administered 2014-11-19: 4 mg via INTRAVENOUS

## 2014-11-19 MED ORDER — OXYCODONE HCL 5 MG/5ML PO SOLN: 5.0000 mg | Freq: Once | ORAL | Status: DC | PRN

## 2014-11-19 MED ORDER — BUPIVACAINE-EPINEPHRINE (PF) 0.5% -1:200000 IJ SOLN
INTRAMUSCULAR | Status: DC | PRN
  Administered 2014-11-19: 25 mL via PERINEURAL

## 2014-11-19 MED ORDER — HYDROMORPHONE HCL 1 MG/ML IJ SOLN
0.2500 mg | INTRAMUSCULAR | Status: DC | PRN
  Administered 2014-11-19 (×2): 0.5 mg via INTRAVENOUS

## 2014-11-19 MED ORDER — LACTATED RINGERS IV SOLN
INTRAVENOUS | Status: DC
  Administered 2014-11-19 (×2): via INTRAVENOUS

## 2014-11-19 MED ORDER — ONDANSETRON HCL 4 MG PO TABS: 4.0000 mg | ORAL_TABLET | Freq: Three times a day (TID) | ORAL | Status: DC | PRN

## 2014-11-19 MED ORDER — LACTATED RINGERS IV SOLN: INTRAVENOUS | Status: DC

## 2014-11-19 MED ORDER — MIDAZOLAM HCL 2 MG/2ML IJ SOLN
INTRAMUSCULAR | Status: AC
  Filled 2014-11-19: qty 2

## 2014-11-19 MED ORDER — PROPOFOL 10 MG/ML IV BOLUS
INTRAVENOUS | Status: DC | PRN
  Administered 2014-11-19: 150 mg via INTRAVENOUS

## 2014-11-19 MED ORDER — CEFAZOLIN SODIUM-DEXTROSE 2-3 GM-% IV SOLR
2.0000 g | INTRAVENOUS | Status: AC
  Administered 2014-11-19: 2 g via INTRAVENOUS

## 2014-11-19 MED ORDER — DEXAMETHASONE SODIUM PHOSPHATE 4 MG/ML IJ SOLN
INTRAMUSCULAR | Status: DC | PRN
  Administered 2014-11-19: 10 mg via INTRAVENOUS

## 2014-11-19 MED ORDER — OXYCODONE HCL 5 MG PO TABS: 5.0000 mg | ORAL_TABLET | Freq: Once | ORAL | Status: DC | PRN

## 2014-11-19 MED ORDER — HYDRALAZINE HCL 20 MG/ML IJ SOLN
INTRAMUSCULAR | Status: AC
  Filled 2014-11-19: qty 1

## 2014-11-19 MED ORDER — SUCCINYLCHOLINE CHLORIDE 20 MG/ML IJ SOLN
INTRAMUSCULAR | Status: DC | PRN
  Administered 2014-11-19: 100 mg via INTRAVENOUS

## 2014-11-19 MED ORDER — FENTANYL CITRATE (PF) 100 MCG/2ML IJ SOLN
INTRAMUSCULAR | Status: AC
  Filled 2014-11-19: qty 2

## 2014-11-19 MED ORDER — CHLORHEXIDINE GLUCONATE 4 % EX LIQD: 60.0000 mL | Freq: Once | CUTANEOUS | Status: DC

## 2014-11-19 SURGICAL SUPPLY — 80 items
ANCH SUT SWLK 19.1X4.75 VT (Anchor) ×2 IMPLANT
ANCH SUT SWLK 19.1X5.5 CLS EL (Anchor) ×1 IMPLANT
ANCHOR PEEK 4.75X19.1 SWLK C (Anchor) ×4 IMPLANT
ANCHOR PEEK SWIVEL LOCK 5.5 (Anchor) ×2 IMPLANT
APL SKNCLS STERI-STRIP NONHPOA (GAUZE/BANDAGES/DRESSINGS)
BENZOIN TINCTURE PRP APPL 2/3 (GAUZE/BANDAGES/DRESSINGS) IMPLANT
BLADE CUTTER GATOR 3.5 (BLADE) ×3 IMPLANT
BLADE CUTTER MENIS 5.5 (BLADE) IMPLANT
BLADE GREAT WHITE 4.2 (BLADE) ×2 IMPLANT
BLADE GREAT WHITE 4.2MM (BLADE) ×1
BLADE SURG 15 STRL LF DISP TIS (BLADE) IMPLANT
BLADE SURG 15 STRL SS (BLADE)
BUR OVAL 6.0 (BURR) ×3 IMPLANT
CANNULA DRY DOC 8X75 (CANNULA) ×2 IMPLANT
CANNULA TWIST IN 8.25X7CM (CANNULA) IMPLANT
CLOSURE WOUND 1/2 X4 (GAUZE/BANDAGES/DRESSINGS)
DECANTER SPIKE VIAL GLASS SM (MISCELLANEOUS) IMPLANT
DRAPE STERI 35X30 U-POUCH (DRAPES) ×3 IMPLANT
DRAPE U-SHAPE 47X51 STRL (DRAPES) ×3 IMPLANT
DRAPE U-SHAPE 76X120 STRL (DRAPES) ×6 IMPLANT
DRSG PAD ABDOMINAL 8X10 ST (GAUZE/BANDAGES/DRESSINGS) ×3 IMPLANT
DURAPREP 26ML APPLICATOR (WOUND CARE) ×3 IMPLANT
ELECT MENISCUS 165MM 90D (ELECTRODE) ×3 IMPLANT
ELECT REM PT RETURN 9FT ADLT (ELECTROSURGICAL) ×3
ELECTRODE REM PT RTRN 9FT ADLT (ELECTROSURGICAL) ×1 IMPLANT
GAUZE SPONGE 4X4 12PLY STRL (GAUZE/BANDAGES/DRESSINGS) ×6 IMPLANT
GAUZE XEROFORM 1X8 LF (GAUZE/BANDAGES/DRESSINGS) ×3 IMPLANT
GLOVE BIOGEL PI IND STRL 6.5 (GLOVE) IMPLANT
GLOVE BIOGEL PI IND STRL 7.0 (GLOVE) ×1 IMPLANT
GLOVE BIOGEL PI INDICATOR 6.5 (GLOVE) ×2
GLOVE BIOGEL PI INDICATOR 7.0 (GLOVE) ×4
GLOVE ECLIPSE 6.5 STRL STRAW (GLOVE) ×2 IMPLANT
GLOVE ECLIPSE 7.0 STRL STRAW (GLOVE) ×3 IMPLANT
GLOVE EXAM NITRILE LRG STRL (GLOVE) ×2 IMPLANT
GLOVE ORTHO TXT STRL SZ7.5 (GLOVE) ×3 IMPLANT
GLOVE SURG ORTHO 8.0 STRL STRW (GLOVE) ×3 IMPLANT
GLOVE SURG SS PI 6.5 STRL IVOR (GLOVE) ×2 IMPLANT
GOWN STRL REUS W/ TWL LRG LVL3 (GOWN DISPOSABLE) ×2 IMPLANT
GOWN STRL REUS W/ TWL XL LVL3 (GOWN DISPOSABLE) ×1 IMPLANT
GOWN STRL REUS W/TWL LRG LVL3 (GOWN DISPOSABLE) ×6
GOWN STRL REUS W/TWL XL LVL3 (GOWN DISPOSABLE) ×3
IV NS IRRIG 3000ML ARTHROMATIC (IV SOLUTION) ×12 IMPLANT
MANIFOLD NEPTUNE II (INSTRUMENTS) ×3 IMPLANT
NDL SCORPION MULTI FIRE (NEEDLE) IMPLANT
NDL SUT 6 .5 CRC .975X.05 MAYO (NEEDLE) IMPLANT
NEEDLE MAYO TAPER (NEEDLE)
NEEDLE SCORPION MULTI FIRE (NEEDLE) ×3 IMPLANT
NS IRRIG 1000ML POUR BTL (IV SOLUTION) IMPLANT
PACK ARTHROSCOPY DSU (CUSTOM PROCEDURE TRAY) ×3 IMPLANT
PACK BASIN DAY SURGERY FS (CUSTOM PROCEDURE TRAY) ×3 IMPLANT
PASSER SUT SWANSON 36MM LOOP (INSTRUMENTS) IMPLANT
PENCIL BUTTON HOLSTER BLD 10FT (ELECTRODE) ×3 IMPLANT
SET ARTHROSCOPY TUBING (MISCELLANEOUS) ×3
SET ARTHROSCOPY TUBING LN (MISCELLANEOUS) ×1 IMPLANT
SLEEVE SCD COMPRESS KNEE MED (MISCELLANEOUS) ×2 IMPLANT
SLING ARM IMMOBILIZER LRG (SOFTGOODS) ×2 IMPLANT
SLING ARM IMMOBILIZER MED (SOFTGOODS) IMPLANT
SLING ARM LRG ADULT FOAM STRAP (SOFTGOODS) IMPLANT
SLING ARM MED ADULT FOAM STRAP (SOFTGOODS) IMPLANT
SLING ARM XL FOAM STRAP (SOFTGOODS) IMPLANT
SPONGE LAP 4X18 X RAY DECT (DISPOSABLE) IMPLANT
STRIP CLOSURE SKIN 1/2X4 (GAUZE/BANDAGES/DRESSINGS) IMPLANT
SUCTION FRAZIER TIP 10 FR DISP (SUCTIONS) IMPLANT
SUT ETHIBOND 2 OS 4 DA (SUTURE) IMPLANT
SUT ETHILON 2 0 FS 18 (SUTURE) IMPLANT
SUT ETHILON 3 0 PS 1 (SUTURE) ×2 IMPLANT
SUT FIBERWIRE #2 38 T-5 BLUE (SUTURE)
SUT RETRIEVER MED (INSTRUMENTS) IMPLANT
SUT TIGER TAPE 7 IN WHITE (SUTURE) ×2 IMPLANT
SUT VIC AB 0 CT1 27 (SUTURE)
SUT VIC AB 0 CT1 27XBRD ANBCTR (SUTURE) IMPLANT
SUT VIC AB 2-0 SH 27 (SUTURE)
SUT VIC AB 2-0 SH 27XBRD (SUTURE) IMPLANT
SUT VIC AB 3-0 FS2 27 (SUTURE) IMPLANT
SUTURE FIBERWR #2 38 T-5 BLUE (SUTURE) IMPLANT
TAPE FIBER 2MM 7IN #2 BLUE (SUTURE) ×2 IMPLANT
TOWEL OR 17X24 6PK STRL BLUE (TOWEL DISPOSABLE) ×3 IMPLANT
TOWEL OR NON WOVEN STRL DISP B (DISPOSABLE) ×3 IMPLANT
WATER STERILE IRR 1000ML POUR (IV SOLUTION) ×3 IMPLANT
YANKAUER SUCT BULB TIP NO VENT (SUCTIONS) IMPLANT

## 2014-11-19 NOTE — Interval H&P Note (Signed)
History and Physical Interval Note:  11/19/2014 7:30 AM  [name] [name]  has presented today for surgery, with the diagnosis of [place], LEFT SHOULDER IMPINGEMENT SYNDROME OF LEFT SHOULDER STRAIN OF MUSCLE AND TENDONS OF THE ROTATOR CUFF   The various methods of treatment have been discussed with the patient and family. After consideration of risks, benefits and other options for treatment, the patient has consented to  Procedure(s): LEFT SHOULDER ARTHROSCOPY, DEBRIDEMENT DISTAL CLAVICLE EXCISION, ACROMIOPLASTY WITH ROTATOR CUFF REPAIR  (Left) as a surgical intervention .  The patient's history has been reviewed, patient examined, no change in status, stable for surgery.  I have reviewed the patient's chart and labs.  Questions were answered to the patient's satisfaction.     Jenniferann Stuckert F

## 2014-11-19 NOTE — Anesthesia Preprocedure Evaluation (Addendum)
Anesthesia Evaluation  Patient identified by MRN, date of birth, ID band Patient awake    Reviewed: Allergy & Precautions, NPO status , Patient's Chart, lab work & pertinent test results, reviewed documented beta blocker date and time   Airway Mallampati: II  TM Distance: >3 FB Neck ROM: Full    Dental   Pulmonary Current Smoker,  breath sounds clear to auscultation        Cardiovascular hypertension, Pt. on home beta blockers + Valvular Problems/Murmurs MVP Rhythm:Regular Rate:Normal     Neuro/Psych negative neurological ROS     GI/Hepatic Neg liver ROS, GERD-  ,  Endo/Other  negative endocrine ROS  Renal/GU negative Renal ROS     Musculoskeletal   Abdominal   Peds  Hematology negative hematology ROS (+)   Anesthesia Other Findings   Reproductive/Obstetrics                            Lab Results  Component Value Date   WBC 7.3 08/06/2012   HGB 14.3 11/19/2014   HCT 43.8 08/06/2012   MCV 80.8 08/06/2012   PLT 233 08/06/2012   Lab Results  Component Value Date   CREATININE 0.94 11/18/2014   BUN 7 11/18/2014   NA 143 11/18/2014   K 3.9 11/18/2014   CL 108 11/18/2014   CO2 28 11/18/2014    Anesthesia Physical Anesthesia Plan  ASA: II  Anesthesia Plan: General and Regional   Post-op Pain Management:    Induction: Intravenous  Airway Management Planned: Oral ETT  Additional Equipment:   Intra-op Plan:   Post-operative Plan: Extubation in OR  Informed Consent: I have reviewed the patients History and Physical, chart, labs and discussed the procedure including the risks, benefits and alternatives for the proposed anesthesia with the patient or authorized representative who has indicated his/her understanding and acceptance.   Dental advisory given  Plan Discussed with: CRNA  Anesthesia Plan Comments:         Anesthesia Quick Evaluation

## 2014-11-19 NOTE — Progress Notes (Signed)
Assisted Dr. Rob Fitzgerald with left, ultrasound guided, interscalene  block. Side rails up, monitors on throughout procedure. See vital signs in flow sheet. Tolerated Procedure well. 

## 2014-11-19 NOTE — Discharge Instructions (Signed)
Shouder arthroscopy, rotator cuff repair, subacromial decompression °Care After Instructions °Refer to this sheet in the next few weeks. These discharge instructions provide you with general information on caring for yourself after you leave the hospital. Your caregiver may also give you specific instructions. Your treatment has been planned according to the most current medical practices available, but unavoidable complications sometimes occur. If you have any problems or questions after discharge, please call your caregiver. °HOME INSTRUCTIONS °You may resume a normal diet and activities as directed.  °Take showers instead of baths until informed otherwise.  °Change bandages (dressings) in 3 days.  Swab wounds daily with betadine.  Wash shoulder with soap and water.  Pat dry.  Cover wounds with bandaids. °Only take over-the-counter or prescription medicines for pain, discomfort, or fever as directed by your caregiver.  °Wear your sling for the next 6 weeks unless otherwise instructed. °Eat a well-balanced diet.  °Avoid lifting or driving until you are instructed otherwise.  °Make an appointment to see your caregiver for stitches (suture) or staple removal one week after surgery.  ° °SEEK MEDICAL CARE IF: °You have swelling of your calf or leg.  °You develop shortness of breath or chest pain.  °You have redness, swelling, or increasing pain in the wound.  °There is pus or any unusual drainage coming from the surgical site.  °You notice a bad smell coming from the surgical site or dressing.  °The surgical site breaks open after sutures or staples have been removed.  °There is persistent bleeding from the suture or staple line.  °You are getting worse or are not improving.  °You have any other questions or concerns.  °SEEK IMMEDIATE MEDICAL CARE IF:  °You have a fever greater than 101 °You develop a rash.  °You have difficulty breathing.  °You develop any reaction or side effects to medicines given.  °Your knee  motion is decreasing rather than improving.  °MAKE SURE YOU:  °Understand these instructions.  °Will watch your condition.  °Will get help right away if you are not doing well or get worse.  ° ° ° °Regional Anesthesia Blocks ° °1. Numbness or the inability to move the "blocked" extremity may last from 3-48 hours after placement. The length of time depends on the medication injected and your individual response to the medication. If the numbness is not going away after 48 hours, call your surgeon. ° °2. The extremity that is blocked will need to be protected until the numbness is gone and the  Strength has returned. Because you cannot feel it, you will need to take extra care to avoid injury. Because it may be weak, you may have difficulty moving it or using it. You may not know what position it is in without looking at it while the block is in effect. ° °3. For blocks in the legs and feet, returning to weight bearing and walking needs to be done carefully. You will need to wait until the numbness is entirely gone and the strength has returned. You should be able to move your leg and foot normally before you try and bear weight or walk. You will need someone to be with you when you first try to ensure you do not fall and possibly risk injury. ° °4. Bruising and tenderness at the needle site are common side effects and will resolve in a few days. ° °5. Persistent numbness or new problems with movement should be communicated to the surgeon or the [facility] (  [phone])/ [facility] ([phone]). ° ° ° ° ° ° °Post Anesthesia Home Care Instructions ° °Activity: °Get plenty of rest for the remainder of the day. A responsible adult should stay with you for 24 hours following the procedure.  °For the next 24 hours, DO NOT: °-Drive a car °-Operate machinery °-Drink alcoholic beverages °-Take any medication unless instructed by your physician °-Make any legal decisions or sign important  papers. ° °Meals: °Start with liquid foods such as gelatin or soup. Progress to regular foods as tolerated. Avoid greasy, spicy, heavy foods. If nausea and/or vomiting occur, drink only clear liquids until the nausea and/or vomiting subsides. Call your physician if vomiting continues. ° °Special Instructions/Symptoms: °Your throat may feel dry or sore from the anesthesia or the breathing tube placed in your throat during surgery. If this causes discomfort, gargle with warm salt water. The discomfort should disappear within 24 hours. ° °If you had a scopolamine patch placed behind your ear for the management of post- operative nausea and/or vomiting: ° °1. The medication in the patch is effective for 72 hours, after which it should be removed.  Wrap patch in a tissue and discard in the trash. Wash hands thoroughly with soap and water. °2. You may remove the patch earlier than 72 hours if you experience unpleasant side effects which may include dry mouth, dizziness or visual disturbances. °3. Avoid touching the patch. Wash your hands with soap and water after contact with the patch. °  ° °

## 2014-11-19 NOTE — Anesthesia Procedure Notes (Addendum)
Anesthesia Regional Block:  Interscalene brachial plexus block  Pre-Anesthetic Checklist: ,, timeout performed, Correct Patient, Correct Site, Correct Laterality, Correct Procedure, Correct Position, site marked, Risks and benefits discussed,  Surgical consent,  Pre-op evaluation,  At surgeon's request and post-op pain management  Laterality: Left  Prep: chloraprep       Needles:  Injection technique: Single-shot  Needle Type: Echogenic Stimulator Needle     Needle Length: 9cm 9 cm Needle Gauge: 21 and 21 G    Additional Needles:  Procedures: ultrasound guided (picture in chart) and nerve stimulator Interscalene brachial plexus block  Nerve Stimulator or Paresthesia:  Response: deltoid, 0.5 mA,   Additional Responses:   Narrative:  Start time: 11/19/2014 7:58 AM End time: 11/19/2014 8:08 AM Injection made incrementally with aspirations every 5 mL.  Performed by: Personally  Anesthesiologist: Suzette Battiest  Additional Notes: Risks and benefits discussed. Pt tolerated well without immediate complications.   Procedure Name: Intubation Performed by: Terrance Mass Pre-anesthesia Checklist: Patient identified, Timeout performed, Emergency Drugs available, Suction available and Patient being monitored Patient Re-evaluated:Patient Re-evaluated prior to inductionOxygen Delivery Method: Circle system utilized Preoxygenation: Pre-oxygenation with 100% oxygen Intubation Type: IV induction Ventilation: Mask ventilation without difficulty Laryngoscope Size: Miller and 2 Grade View: Grade I Tube type: Oral Tube size: 7.0 mm Number of attempts: 1 Airway Equipment and Method: Stylet Placement Confirmation: ETT inserted through vocal cords under direct vision,  breath sounds checked- equal and bilateral and positive ETCO2 Secured at: 22 cm Tube secured with: Tape Dental Injury: Teeth and Oropharynx as per pre-operative assessment

## 2014-11-19 NOTE — Anesthesia Postprocedure Evaluation (Signed)
  Anesthesia Post-op Note  Patient: [name] [name]  Procedure(s) Performed: Procedure(s): LEFT SHOULDER ARTHROSCOPY, DEBRIDEMENT DISTAL CLAVICLE EXCISION, ACROMIOPLASTY WITH ROTATOR CUFF REPAIR  (Left) SHOULDER ARTHROSCOPY WITH DISTAL CLAVICLE RESECTION (Left)  Patient Location: PACU  Anesthesia Type:GA combined with regional for post-op pain  Level of Consciousness: awake, alert  and oriented  Airway and Oxygen Therapy: Patient Spontanous Breathing  Post-op Pain: mild  Post-op Assessment: Post-op Vital signs reviewed  Post-op Vital Signs: Reviewed  Last Vitals:  Filed Vitals:   11/19/14 1147  BP: 184/105  Pulse: 69  Temp:   Resp: 21    Complications: No apparent anesthesia complications

## 2014-11-19 NOTE — Transfer of Care (Signed)
Immediate Anesthesia Transfer of Care Note  Patient: [name] [name]  Procedure(s) Performed: Procedure(s): LEFT SHOULDER ARTHROSCOPY, DEBRIDEMENT DISTAL CLAVICLE EXCISION, ACROMIOPLASTY WITH ROTATOR CUFF REPAIR  (Left) SHOULDER ARTHROSCOPY WITH DISTAL CLAVICLE RESECTION (Left)  Patient Location: PACU  Anesthesia Type:General  Level of Consciousness: awake and sedated  Airway & Oxygen Therapy: Patient Spontanous Breathing and Patient connected to face mask oxygen  Post-op Assessment: Report given to RN and Post -op Vital signs reviewed and stable  Post vital signs: Reviewed and stable  Last Vitals:  Filed Vitals:   11/19/14 0817  BP:   Pulse: 75  Temp:   Resp: 24    Complications: No apparent anesthesia complications

## 2014-11-20 ENCOUNTER — Other Ambulatory Visit: Payer: Self-pay | Admitting: *Deleted

## 2014-11-20 ENCOUNTER — Encounter (HOSPITAL_BASED_OUTPATIENT_CLINIC_OR_DEPARTMENT_OTHER): Payer: Self-pay | Admitting: Orthopedic Surgery

## 2014-11-20 MED ORDER — NEBIVOLOL HCL 5 MG PO TABS
5.0000 mg | ORAL_TABLET | Freq: Every day | ORAL | Status: DC
Start: 2014-11-20 — End: 2015-08-23

## 2014-11-20 NOTE — Op Note (Signed)
NAME:  TEJA, JUDICE NO.:  [id number]  MEDICAL RECORD NO.:  [id number]  LOCATION:                                FACILITY:  [facility]  PHYSICIAN:  Ninetta Lights, M.D. DATE OF BIRTH:  July 22, 1960  DATE OF PROCEDURE:  11/19/2014 DATE OF DISCHARGE:  11/19/2014                              OPERATIVE REPORT   PREOPERATIVE DIAGNOSES:  Left shoulder extensive partial versus complete rotator cuff tear.  Chronic long head biceps tendon rupture. Subacromial impingement.  Distal clavicle osteolysis.  POSTOPERATIVE DIAGNOSES:  Left shoulder extensive partial versus complete rotator cuff tear.  Chronic long head biceps tendon rupture. Subacromial impingement.  Distal clavicle osteolysis.  Full-thickness attritional tear rotator cuff supraspinatus tendon with marked intertendinous tearing and delamination.  A small residual biceps stump on the top of the glenoid.  PROCEDURE:  Left shoulder exam under anesthesia, arthroscopy. Debridement of the glenohumeral joint including labrum, the stump of the biceps.  Debridement and mobilization of rotator cuff above and below. Bursectomy, acromioplasty, CA ligament release.  Excision of distal clavicle.  Arthroscopic-assisted rotator cuff repair with FiberWire suture x2, SwiveLock anchors x2.  SURGEON:  Ninetta Lights, M.D.  ASSISTANT:  Elmyra Ricks, P.A., present throughout the entire case and necessary for timely completion of procedure.  ANESTHESIA:  General.  BLOOD LOSS:  Minimal.  SPECIMENS:  None.  CULTURES:  None.  COMPLICATIONS:  None.  DRESSINGS:  Soft compressive with shoulder immobilizer.  PROCEDURE IN DETAIL:  The patient was brought to the operating room, placed on the operating table in supine position.  After adequate anesthesia had been obtained, shoulder examined.  Full motion and stable shoulder.  Placed in a beach-chair position on the shoulder positioner, prepped and draped in usual sterile  fashion.  Three portals; anterior, posterior, and lateral.  Arthroscope introduced, shoulder distended and inspected.  Biceps had ruptured and retracted on the shoulder.  The stump of the top of the glenoid debrided.  A little tearing of the labrum debrided as well.  Articular cartilage, capsule ligamentous structures all otherwise intact.  Extensive attrition, partial tearing, bottom of the rotator cuff throughout the supraspinatus.  Looking at the __________ was already a full-thickness component.  This was debrided, mobilized from below __________ subacromially and same done above.  This was marked attrition, delamination throughout the entire supraspinatus crescent region going a bit medial.  There was still reasonable tissue quality.  Subscap infraspinatus intact.  Bursa resected.  Acromioplasty from type 2 to type 1 acromion.  Grade 4 changes AC joint with spurring. Periarticular spurs __________ clavicle resected.  Adequacy of decompression confirmed viewing from all portals.  I then placed a cannula laterally.  The debrided, mobilized cuff was captured with 2 horizontal mattress sutures.  Anchored down the tuberosity with 2 SwiveLock anchors.  The bone was relatively soft, and I had to get a larger SwiveLock for the back anchor, but I was able to achieve a nice firm watertight closure of the cuff.  Instruments were completely removed.  Portals were closed with nylon.  Sterile compressive dressing applied.  Anesthesia reversed.  Brought to the recovery room.  Tolerated the surgery well.  No complications.     Ninetta Lights, M.D.     DFM/MEDQ  D:  11/19/2014  T:  11/20/2014  Job:  [id number]
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
## 2014-11-30 ENCOUNTER — Other Ambulatory Visit: Payer: Self-pay | Admitting: Internal Medicine

## 2014-12-14 ENCOUNTER — Encounter: Payer: Self-pay | Admitting: Internal Medicine

## 2014-12-14 ENCOUNTER — Ambulatory Visit (INDEPENDENT_AMBULATORY_CARE_PROVIDER_SITE_OTHER): Payer: Medicare HMO | Admitting: Internal Medicine

## 2014-12-14 VITALS — BP 143/90 | HR 80 | Temp 98.4°F | Ht 68.0 in | Wt 175.2 lb

## 2014-12-14 DIAGNOSIS — M75102 Unspecified rotator cuff tear or rupture of left shoulder, not specified as traumatic: Secondary | ICD-10-CM

## 2014-12-14 DIAGNOSIS — Z9889 Other specified postprocedural states: Secondary | ICD-10-CM | POA: Diagnosis not present

## 2014-12-14 NOTE — Progress Notes (Signed)
   Subjective:    Patient ID: Brenten Janney, male    DOB: Jul 24, 1960, 54 y.o.   MRN: [id number]  HPI  Patient is a 54 year old gentle woman with a history of lupus, rheumatoid arthritis, back pain, hypertension, rotator cuff tear who presents to clinic for a routine follow-up.  Please see problem based charting for more details.  Review of Systems  Constitutional: Negative for fever and chills.  HENT: Negative for sore throat.   Respiratory: Negative for shortness of breath.   Cardiovascular: Negative for chest pain and palpitations.  Gastrointestinal: Negative for nausea, vomiting, abdominal pain, diarrhea, constipation and blood in stool.  Genitourinary: Negative for dysuria and hematuria.  Musculoskeletal:       Left shoulder stiffness and pain  Skin: Negative for rash.  Neurological: Negative for syncope.  Psychiatric/Behavioral: Negative for suicidal ideas.       Objective:   Physical Exam  Constitutional: He is oriented to person, place, and time. He appears well-developed and well-nourished. No distress.  HENT:  Head: Normocephalic and atraumatic.  Eyes: EOM are normal. Pupils are equal, round, and reactive to light. No scleral icterus.  Neck: Normal range of motion. Neck supple. No thyromegaly present.  Cardiovascular: Normal rate and regular rhythm.  Exam reveals no gallop and no friction rub.   No murmur heard. Pulmonary/Chest: Effort normal and breath sounds normal. No respiratory distress. He has no wheezes. He has no rales.  Abdominal: Soft. Bowel sounds are normal. He exhibits no distension. There is no tenderness. There is no rebound.  Musculoskeletal: Normal range of motion. He exhibits no edema.  Left shoulder with arthroscopy scars, tenderness to palpation, left arm in sling, testing of range of motion deferred secondary to pain  Neurological: He is alert and oriented to person, place, and time. No cranial nerve deficit.  Skin: No rash noted.      Assessment  & Plan:  Please see problem based charting for more details.

## 2014-12-14 NOTE — Patient Instructions (Signed)
Please continue following up with her surgeon regarding pain management after your surgery. Please continue to stay off the aspirin as this can contribute to increased risk for gastrointestinal bleeding.

## 2014-12-14 NOTE — Assessment & Plan Note (Addendum)
Patient is status post rotator cuff tear by orthopedic surgery. Pain is well controlled on Percocet 5-325 mg every 4 hours when necessary. There are some previous concern the patient had been taking excessive amount of aspirin that was associated with epigastric pain. Patient denying any epigastric pain, vomiting, diarrhea, hematochezia, melena today. He has stopped taking his aspirin since his last visit. -Patient to follow-up with orthopedic surgery

## 2014-12-17 NOTE — Addendum Note (Signed)
Addended by: Gilles Chiquito B on: 12/17/2014 11:17 AM   Modules accepted: Level of Service

## 2014-12-17 NOTE — Progress Notes (Signed)
Internal Medicine Clinic Attending  Case discussed with Dr. Ngo at the time of the visit.  We reviewed the resident's history and exam and pertinent patient test results.  I agree with the assessment, diagnosis, and plan of care documented in the resident's note. 

## 2015-01-04 ENCOUNTER — Other Ambulatory Visit: Payer: Self-pay | Admitting: Internal Medicine

## 2015-01-06 ENCOUNTER — Other Ambulatory Visit: Payer: Self-pay | Admitting: Oncology

## 2015-01-06 ENCOUNTER — Ambulatory Visit (INDEPENDENT_AMBULATORY_CARE_PROVIDER_SITE_OTHER): Payer: Medicare HMO | Admitting: Internal Medicine

## 2015-01-06 ENCOUNTER — Encounter: Payer: Self-pay | Admitting: Internal Medicine

## 2015-01-06 VITALS — BP 178/107 | HR 80 | Temp 98.1°F | Ht 68.0 in | Wt 177.6 lb

## 2015-01-06 DIAGNOSIS — M25511 Pain in right shoulder: Secondary | ICD-10-CM

## 2015-01-06 DIAGNOSIS — M25512 Pain in left shoulder: Secondary | ICD-10-CM | POA: Diagnosis not present

## 2015-01-06 DIAGNOSIS — M549 Dorsalgia, unspecified: Secondary | ICD-10-CM | POA: Diagnosis not present

## 2015-01-06 DIAGNOSIS — R52 Pain, unspecified: Secondary | ICD-10-CM

## 2015-01-06 DIAGNOSIS — Z9889 Other specified postprocedural states: Secondary | ICD-10-CM

## 2015-01-06 MED ORDER — OXYCODONE-ACETAMINOPHEN 10-325 MG PO TABS: 1.0000 | ORAL_TABLET | Freq: Four times a day (QID) | ORAL | Status: DC | PRN

## 2015-01-06 NOTE — Progress Notes (Signed)
Patient ID: [name] [name], male   DOB: 15-May-1961, 54 y.o.   MRN: [id number]   Subjective:   Patient ID: [name] [name] male   DOB: 05-Jan-1961 54 y.o.   MRN: [id number]  HPI: [name] is a 54 y.o. male with PMH as listed below. He is visiting [place] today for change in pain medication. The patient states that he has been on chronic Percocet 10-325 mg every 6 hours as needed for back and shoulder pain. He had left shoulder surgery on 11/19/14 and had his pain managed by the orthopedic team. The patient reports that about 10 days ago, the orthopedic doctor decreased his Percocet from 10-325 mg to 5-325 mg. He says that this dose is not enough to control his pain and had started taking 10-15 aspirin a day for pain relief in addition to Percocet 5-325 mg. He stopped taking aspirin today per advise of one of his healthcare teams. He states that he did have one episode of dark stool about 3 weeks ago after which he stopped aspirin for 2 days.  The patient states that he is scheduled for right shoulder surgery this Monday (01/11/2015) and wants to have his pain medication increased back to 10-325 mg.    Past Medical History  Diagnosis Date  . Lupus     ied forward by the bloody EMR.   . History of anemia     no current med.  [name] GERD (gastroesophageal reflux disease)   . Hypertension     states is borderline on medication; has been on med. x 5-6 yr.  . Heart murmur     states no problems, no cardiologist  . 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 Impingement syndrome of shoulder region 10/2014    left  . Strain of tendon of left rotator cuff 10/2014  . RA (rheumatoid arthritis)   . Osteoarthritis     left shoulder  . 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 Abrasion of left thumb 11/16/2014   Current Outpatient Prescriptions  Medication Sig Dispense Refill  . cyclobenzaprine (FLEXERIL) 5 MG tablet Take 1 tablet (5 mg total) by mouth every 8 (eight) hours as needed for muscle spasms.  30 tablet 1  . ENBREL SURECLICK 50 MG/ML injection Inject 50 mg into the skin once a week.    . ferrous sulfate 325 (65 FE) MG tablet Take 325 mg by mouth daily with breakfast.    . leflunomide (ARAVA) 10 MG tablet Take 10 mg by mouth daily.    . nebivolol (BYSTOLIC) 5 MG tablet Take 1 tablet (5 mg total) by mouth daily. 90 tablet 1  . ondansetron (ZOFRAN) 4 MG tablet Take 1 tablet (4 mg total) by mouth every 8 (eight) hours as needed for nausea or vomiting. 40 tablet 0  . oxyCODONE-acetaminophen (PERCOCET) 10-325 MG per tablet Take 1 tablet by mouth every 6 (six) hours as needed for pain. 56 tablet 0  . pantoprazole (PROTONIX) 40 MG tablet Take 1 tablet (40 mg total) by mouth 2 (two) times daily. (Patient taking differently: Take 40 mg by mouth daily. ) 60 tablet 1   No current facility-administered medications for this visit.   Family History  Problem Relation Age of Onset  . Stroke Neg Hx   . Cancer Neg Hx    History   Social History  . Marital Status: Single    Spouse Name: N/A  . Number of Children: N/A  . Years of Education: N/A  Social History Main Topics  . Smoking status: Current Every Day Smoker -- 20 years    Types: Cigars  . Smokeless tobacco: Never Used     Comment: 2-3 cig./day  . Alcohol Use: No  . Drug Use: No  . Sexual Activity: Not on file   Other Topics Concern  . None   Social History Narrative   Patient lives by himself, is on disability since 1992, used to work with [employer] [place]. 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 Smokes 1/2 PPD and has been smoking since 25 years. Drinks only occasionally.         Review of Systems: Review of Systems  Constitutional: Negative for fever, chills, weight loss, malaise/fatigue and diaphoresis.  HENT: Negative for congestion, nosebleeds and sore throat.   Eyes: Negative for blurred vision, double vision and discharge.  Respiratory: Negative for cough, hemoptysis, sputum production, shortness of breath and wheezing.     Cardiovascular: Negative for chest pain, palpitations and leg swelling.  Gastrointestinal: Positive for heartburn. Negative for nausea, vomiting, abdominal pain, diarrhea, constipation and blood in stool.       Positive for one episode of dark stool, possible melena.  Genitourinary: Negative for dysuria, urgency, frequency and hematuria.  Musculoskeletal: Positive for back pain and joint pain. Negative for myalgias and falls.  Skin: Negative for itching and rash.  Neurological: Negative for dizziness, tingling, focal weakness and headaches.  Endo/Heme/Allergies: Does not bruise/bleed easily.     Objective:  Physical Exam: Filed Vitals:   01/06/15 1105  BP: 178/107  Pulse: 80  Temp: 98.1 F (36.7 C)  TempSrc: Oral  Height: 5\' 8"  (1.727 m)  Weight: 177 lb 9.6 oz (80.559 kg)  SpO2: 97%   Physical Exam  Constitutional: He is oriented to person, place, and time. He appears well-developed and well-nourished.  HENT:  Head: Normocephalic and atraumatic.  Eyes: Conjunctivae and EOM are normal. Pupils are equal, round, and reactive to light. No scleral icterus.  Cardiovascular: Normal rate, regular rhythm and normal heart sounds.   Pulmonary/Chest: Effort normal and breath sounds normal. No respiratory distress. He has no wheezes.  Abdominal: Soft. Bowel sounds are normal. He exhibits no distension. There is no tenderness.  Musculoskeletal: He exhibits no edema.       Right shoulder: He exhibits decreased range of motion and tenderness.       Left shoulder: He exhibits no tenderness.  Left arm is in a sling. ROM somewhat decreased, better than Right shoulder.  Neurological: He is alert and oriented to person, place, and time.  Skin: Skin is warm. No rash noted. No erythema.  Psychiatric: He has a normal mood and affect.     Assessment & Plan:  Please see problem based charting for current assessment and plan.

## 2015-01-06 NOTE — Patient Instructions (Signed)
We are giving you Percocet 10-325 to manage your pain for the next week or two. Please talk to your Orthopedic and back doctor for your future pain management.

## 2015-01-06 NOTE — Progress Notes (Signed)
Medicine attending: I personally interviewed and briefly examined this patient and reviewed pertinent laboratory and radiographic data together with resident physician Dr.Vishal Posey Pronto and I management plan discussed.

## 2015-01-06 NOTE — Assessment & Plan Note (Signed)
Patient states he has back pain, right shoulder pain, and pain s/p left shoulder surgery. He was recently on Percocet 10-325 mg every 6 hours as needed. This was decreased to 5-325 mg about 10 days ago, which the patient states is not controlling his pain. Since the decrease, he was taking aspirin 10-15 times a day. He discontinued aspirin use today. He states the Percocet 10-325 mg dose was working for him and would like to have a prescription for that filled. He states he is scheduled for right shoulder surgery with Dr. Kathryne Hitch on 01/11/2015.  We will give a two week supply of Percocet 10-325 mg dose (#56) every 6 hours as needed for pain. We advised that he should have his pain management controlled by either his orthopedic doctor or back doctor and that we will be unlikely to supply his pain medication in the future, which he understands.

## 2015-01-07 NOTE — Telephone Encounter (Signed)
No documentation to support need for PPI by prior PCP  HTN not controlled last several acute visits and last visit for chronic med issues was Nov 2015.   Will he come in next 4 months to address chronic issues, inc HTN and GERD?  I refilled PPI

## 2015-01-07 NOTE — Telephone Encounter (Signed)
Message sent to front desk to schedule.

## 2015-02-02 ENCOUNTER — Encounter (HOSPITAL_BASED_OUTPATIENT_CLINIC_OR_DEPARTMENT_OTHER): Payer: Self-pay | Admitting: *Deleted

## 2015-02-03 ENCOUNTER — Other Ambulatory Visit: Payer: Self-pay | Admitting: Physician Assistant

## 2015-02-03 ENCOUNTER — Encounter (HOSPITAL_BASED_OUTPATIENT_CLINIC_OR_DEPARTMENT_OTHER)
Admission: RE | Admit: 2015-02-03 | Discharge: 2015-02-03 | Disposition: A | Discharge status: Home / Self Care | Payer: Medicare HMO | Source: Ambulatory Visit | Attending: Orthopedic Surgery | Admitting: Orthopedic Surgery

## 2015-02-03 DIAGNOSIS — Z01818 Encounter for other preprocedural examination: Secondary | ICD-10-CM | POA: Insufficient documentation

## 2015-02-03 LAB — BASIC METABOLIC PANEL
Anion gap: 7 (ref 5–15)
BUN: 5 mg/dL — ABNORMAL LOW (ref 6–20)
CALCIUM: 9 mg/dL (ref 8.9–10.3)
CO2: 26 mmol/L (ref 22–32)
CREATININE: 0.91 mg/dL (ref 0.61–1.24)
Chloride: 108 mmol/L (ref 101–111)
GFR calc non Af Amer: >60 mL/min (ref >60)
Glucose, Bld: 110 mg/dL — ABNORMAL HIGH (ref 65–99)
POTASSIUM: 4.2 mmol/L (ref 3.5–5.1)
Sodium: 141 mmol/L (ref 135–145)

## 2015-02-03 NOTE — H&P (Signed)
This is a pleasant 54 year-old gentleman who presents to our clinic today six weeks status post left shoulder decompression and rotator cuff repair.  Date of surgery: Nov 14, 2014.  Coalton has been doing exceptionally well since the time of surgery.  Still having some pain, however this is greatly improving.  He has been in physical therapy and is progressing nicely.  The other issue Tamari brings up today is his right shoulder.  He has a longstanding history of pain throughout the right shoulder which has most recently become intolerable after addressing his left shoulder.  He does have a history of long head of the biceps rupture.  No previous injection of the right shoulder.   Past medical, social and family history reviewed in detail on the patient questionnaire and signed.  Review of systems: As detailed in HPI.  All others reviewed and are negative.   EXAMINATION: Well-developed, well-nourished male in no acute distress.  Alert and oriented x 3.  Examination of his left shoulder reveals near full active range of motion in all directions.  4/5 strength with resisted external rotation.  Examination of his right shoulder reveals 75% active range of motion.  Full passive range of motion.  4/5 strength with resisted external rotation.  Positive empty can.  Positive cross body.    X-RAYS: Three views of his right shoulder reveal a Type II acromion.  Decreased glenohumeral space.  Moderate AC changes.    IMPRESSION: 1. Status post left shoulder surgery.   2. Right shoulder impingement syndrome with probable rotator cuff tear.    PLAN: In regards to Nobuo' left shoulder, we are pleased he is doing so well.  It is okay for him to go ahead and D/C his sling.  He is to continue with physical therapy, as they will step it up to the next phase.  He is to follow up with [place] in six weeks time.  In regards to his right shoulder, we are proceeding with a 1:4 subacromial injection in hopes of giving Reid some  relief of pain.  We are going to go ahead and proceed with an MRI of the right shoulder to rule out a rotator cuff tear due to the longevity of symptoms.  We will call him once this is complete.  We will go ahead and fill out paperwork to proceed with a right shoulder arthroscopic decompression and probable rotator cuff tear.  However, if he does exceptionally well with the injection and there is no tear, we will continue to do these.   PROCEDURE NOTE: The patient's clinical condition is marked by substantial pain and/or significant functional disability.  Other conservative therapy has not provided relief, is contraindicated, or not appropriate.  There is a reasonable likelihood that injection will significantly improve the patient's pain and/or functional disability.  Patient is seated on the exam table.  The right shoulder is prepped with Betadine and alcohol and injected into the subacromial space with 40 mg of Depo-Medrol and 4 cc of Marcaine.  Patient tolerated the procedure without difficulty.    ADDENDUM:     After giving the subacromial injection to the right shoulder two weeks to set in, Amaar noted only transient relief with the Marcaine in place.  He also noted no improvement after following a Jobst exercise program for the right shoulder.  At this point,  the only relief of pain will be from an arthroscopic decompression of the right shoulder.      Ninetta Lights,  M.D.

## 2015-02-04 ENCOUNTER — Ambulatory Visit (HOSPITAL_BASED_OUTPATIENT_CLINIC_OR_DEPARTMENT_OTHER): Admission: RE | Admit: 2015-02-04 | Payer: Medicare HMO | Source: Ambulatory Visit | Admitting: Orthopedic Surgery

## 2015-02-04 SURGERY — SHOULDER ARTHROSCOPY WITH SUBACROMIAL DECOMPRESSION, ROTATOR CUFF REPAIR AND BICEP TENDON REPAIR
Anesthesia: General | Laterality: Right

## 2015-02-04 MED ORDER — FENTANYL CITRATE (PF) 100 MCG/2ML IJ SOLN
INTRAMUSCULAR | Status: AC
  Filled 2015-02-04: qty 2

## 2015-02-04 NOTE — Interval H&P Note (Signed)
History and Physical Interval Note:  02/04/2015 7:29 AM  [name] [name]  has presented today for surgery, with the diagnosis of [place] DISORDERS,RIGHT   The various methods of treatment have been discussed with the patient and family. After consideration of risks, benefits and other options for treatment, the patient has consented to  Procedure(s): RIGHT SHOULDER ARTHROSCOPY,DEBRIDEMENT,SUBACROMIAL DECOMPRESSION, DISTAL CLAVICAL EXCISION  ROTATOR CUFF REPAIR AND BICEP TENODESIS (Right) as a surgical intervention .  The patient's history has been reviewed, patient examined, no change in status, stable for surgery.  I have reviewed the patient's chart and labs.  Questions were answered to the patient's satisfaction.     Ninetta Lights

## 2015-02-04 NOTE — H&P (View-Only) (Signed)
This is a pleasant 54 year-old gentleman who presents to our clinic today six weeks status post left shoulder decompression and rotator cuff repair.  Date of surgery: Nov 14, 2014.  Joseantonio has been doing exceptionally well since the time of surgery.  Still having some pain, however this is greatly improving.  He has been in physical therapy and is progressing nicely.  The other issue Tres brings up today is his right shoulder.  He has a longstanding history of pain throughout the right shoulder which has most recently become intolerable after addressing his left shoulder.  He does have a history of long head of the biceps rupture.  No previous injection of the right shoulder.   Past medical, social and family history reviewed in detail on the patient questionnaire and signed.  Review of systems: As detailed in HPI.  All others reviewed and are negative.   EXAMINATION: Well-developed, well-nourished male in no acute distress.  Alert and oriented x 3.  Examination of his left shoulder reveals near full active range of motion in all directions.  4/5 strength with resisted external rotation.  Examination of his right shoulder reveals 75% active range of motion.  Full passive range of motion.  4/5 strength with resisted external rotation.  Positive empty can.  Positive cross body.    X-RAYS: Three views of his right shoulder reveal a Type II acromion.  Decreased glenohumeral space.  Moderate AC changes.    IMPRESSION: 1. Status post left shoulder surgery.   2. Right shoulder impingement syndrome with probable rotator cuff tear.    PLAN: In regards to Barak' left shoulder, we are pleased he is doing so well.  It is okay for him to go ahead and D/C his sling.  He is to continue with physical therapy, as they will step it up to the next phase.  He is to follow up with [place] in six weeks time.  In regards to his right shoulder, we are proceeding with a 1:4 subacromial injection in hopes of giving Greer some  relief of pain.  We are going to go ahead and proceed with an MRI of the right shoulder to rule out a rotator cuff tear due to the longevity of symptoms.  We will call him once this is complete.  We will go ahead and fill out paperwork to proceed with a right shoulder arthroscopic decompression and probable rotator cuff tear.  However, if he does exceptionally well with the injection and there is no tear, we will continue to do these.   PROCEDURE NOTE: The patient's clinical condition is marked by substantial pain and/or significant functional disability.  Other conservative therapy has not provided relief, is contraindicated, or not appropriate.  There is a reasonable likelihood that injection will significantly improve the patient's pain and/or functional disability.  Patient is seated on the exam table.  The right shoulder is prepped with Betadine and alcohol and injected into the subacromial space with 40 mg of Depo-Medrol and 4 cc of Marcaine.  Patient tolerated the procedure without difficulty.    ADDENDUM:     After giving the subacromial injection to the right shoulder two weeks to set in, Kellin noted only transient relief with the Marcaine in place.  He also noted no improvement after following a Jobst exercise program for the right shoulder.  At this point,  the only relief of pain will be from an arthroscopic decompression of the right shoulder.      Ninetta Lights,  M.D.

## 2015-03-02 ENCOUNTER — Other Ambulatory Visit: Payer: Self-pay | Admitting: Physician Assistant

## 2015-03-02 NOTE — H&P (Signed)
This is a pleasant 54 year-old gentleman who presents to our clinic today six weeks status post left shoulder decompression and rotator cuff repair.  Date of surgery: Nov 14, 2014.  [name] [name] has been doing exceptionally well since the time of surgery.  Still having some pain, however this is greatly improving.  He has been in physical therapy and is progressing nicely.  The other issue [name] [name] brings up today is his right shoulder.  He has a longstanding history of pain throughout the right shoulder which has most recently become intolerable after addressing his left shoulder.  He does have a history of long head of the biceps rupture.  No previous injection of the right shoulder.   Past medical, social and family history reviewed in detail on the patient questionnaire and signed.  Review of systems: As detailed in HPI.  All others reviewed and are negative.   EXAMINATION: Well-developed, well-nourished male in no acute distress.  Alert and oriented x 3.  Examination of his left shoulder reveals near full active range of motion in all directions.  4/5 strength with resisted external rotation.  Examination of his right shoulder reveals 75% active range of motion.  Full passive range of motion.  4/5 strength with resisted external rotation.  Positive empty can.  Positive cross body.    X-RAYS: Three views of his right shoulder reveal a Type II acromion.  Decreased glenohumeral space.  Moderate AC changes.    IMPRESSION: 1. Status post left shoulder surgery.   2. Right shoulder impingement syndrome with probable rotator cuff tear.    PLAN: In regards to Zeph' left shoulder, we are pleased he is doing so well.  It is okay for him to go ahead and D/C his sling.  He is to continue with physical therapy, as they will step it up to the next phase.  He is to follow up with [place] in six weeks time.  In regards to his right shoulder, we are proceeding with a 1:4 subacromial injection in hopes of giving [name] [name] some  relief of pain.  We are going to go ahead and proceed with an MRI of the right shoulder to rule out a rotator cuff tear due to the longevity of symptoms.  We will call him once this is complete.  We will go ahead and fill out paperwork to proceed with a right shoulder arthroscopic decompression and probable rotator cuff tear.  However, if he does exceptionally well with the injection and there is no tear, we will continue to do these.   PROCEDURE NOTE: The patient's clinical condition is marked by substantial pain and/or significant functional disability.  Other conservative therapy has not provided relief, is contraindicated, or not appropriate.  There is a reasonable likelihood that injection will significantly improve the patient's pain and/or functional disability.  Patient is seated on the exam table.  The right shoulder is prepped with Betadine and alcohol and injected into the subacromial space with 40 mg of Depo-Medrol and 4 cc of Marcaine.  Patient tolerated the procedure without difficulty.    ADDENDUM:   01/22/15:  After giving the subacromial injection to the right shoulder two weeks to set in, [name] [name] noted only transient relief with the Marcaine in place.  He also noted no improvement after following a [employer] exercise program for the right shoulder.  At this point,  the only relief of pain will be from an arthroscopic decompression of the right shoulder.      Ninetta Lights,  M.D.  

## 2015-03-04 MED ORDER — LACTATED RINGERS IV SOLN: INTRAVENOUS | Status: DC

## 2015-03-04 MED ORDER — FENTANYL CITRATE (PF) 100 MCG/2ML IJ SOLN: 50.0000 ug | INTRAMUSCULAR | Status: DC | PRN

## 2015-03-04 MED ORDER — CHLORHEXIDINE GLUCONATE 4 % EX LIQD: 60.0000 mL | Freq: Once | CUTANEOUS | Status: DC

## 2015-03-04 MED ORDER — CEFAZOLIN SODIUM-DEXTROSE 2-3 GM-% IV SOLR: 2.0000 g | INTRAVENOUS | Status: DC

## 2015-03-04 MED ORDER — SCOPOLAMINE 1 MG/3DAYS TD PT72: 1.0000 | MEDICATED_PATCH | Freq: Once | TRANSDERMAL | Status: DC | PRN

## 2015-03-04 MED ORDER — GLYCOPYRROLATE 0.2 MG/ML IJ SOLN: 0.2000 mg | Freq: Once | INTRAMUSCULAR | Status: DC | PRN

## 2015-03-04 MED ORDER — MIDAZOLAM HCL 2 MG/2ML IJ SOLN: 1.0000 mg | INTRAMUSCULAR | Status: DC | PRN

## 2015-03-04 NOTE — Progress Notes (Signed)
EKG 11/18/14.  BMET done 02/03/15- do not need to repeat per Dr Orene Desanctis for surgery 03/11/15.

## 2015-03-11 ENCOUNTER — Ambulatory Visit (HOSPITAL_BASED_OUTPATIENT_CLINIC_OR_DEPARTMENT_OTHER)
Admission: RE | Admit: 2015-03-11 | Discharge: 2015-03-11 | Disposition: A | Discharge status: Home / Self Care | Payer: Medicare HMO | Source: Ambulatory Visit | Attending: Orthopedic Surgery | Admitting: Orthopedic Surgery

## 2015-03-11 ENCOUNTER — Encounter (HOSPITAL_BASED_OUTPATIENT_CLINIC_OR_DEPARTMENT_OTHER)
Admission: RE | Disposition: A | Discharge status: Home / Self Care | Payer: Self-pay | Source: Ambulatory Visit | Attending: Orthopedic Surgery

## 2015-03-11 ENCOUNTER — Encounter (HOSPITAL_BASED_OUTPATIENT_CLINIC_OR_DEPARTMENT_OTHER): Payer: Self-pay | Admitting: *Deleted

## 2015-03-11 ENCOUNTER — Ambulatory Visit (HOSPITAL_BASED_OUTPATIENT_CLINIC_OR_DEPARTMENT_OTHER): Payer: Medicare HMO | Admitting: Anesthesiology

## 2015-03-11 DIAGNOSIS — M24011 Loose body in right shoulder: Secondary | ICD-10-CM | POA: Insufficient documentation

## 2015-03-11 DIAGNOSIS — M75101 Unspecified rotator cuff tear or rupture of right shoulder, not specified as traumatic: Secondary | ICD-10-CM | POA: Insufficient documentation

## 2015-03-11 DIAGNOSIS — I10 Essential (primary) hypertension: Secondary | ICD-10-CM | POA: Diagnosis not present

## 2015-03-11 DIAGNOSIS — M7541 Impingement syndrome of right shoulder: Secondary | ICD-10-CM | POA: Insufficient documentation

## 2015-03-11 DIAGNOSIS — F17200 Nicotine dependence, unspecified, uncomplicated: Secondary | ICD-10-CM | POA: Insufficient documentation

## 2015-03-11 DIAGNOSIS — M659 Synovitis and tenosynovitis, unspecified: Secondary | ICD-10-CM | POA: Insufficient documentation

## 2015-03-11 DIAGNOSIS — M069 Rheumatoid arthritis, unspecified: Secondary | ICD-10-CM | POA: Insufficient documentation

## 2015-03-11 DIAGNOSIS — M19011 Primary osteoarthritis, right shoulder: Secondary | ICD-10-CM | POA: Insufficient documentation

## 2015-03-11 HISTORY — PX: SHOULDER ARTHROSCOPY WITH BICEPSTENOTOMY: SHX6204

## 2015-03-11 LAB — POCT HEMOGLOBIN-HEMACUE: HEMOGLOBIN: 14.1 g/dL (ref 13.0–17.0)

## 2015-03-11 SURGERY — SHOULDER ARTHROSCOPY WITH SUBACROMIAL DECOMPRESSION AND DISTAL CLAVICLE EXCISION
Anesthesia: General | Site: Shoulder | Laterality: Right

## 2015-03-11 MED ORDER — LIDOCAINE HCL (CARDIAC) 20 MG/ML IV SOLN
INTRAVENOUS | Status: DC | PRN
  Administered 2015-03-11: 80 mg via INTRAVENOUS

## 2015-03-11 MED ORDER — EPHEDRINE SULFATE 50 MG/ML IJ SOLN
INTRAMUSCULAR | Status: DC | PRN
  Administered 2015-03-11: 10 mg via INTRAVENOUS

## 2015-03-11 MED ORDER — SODIUM CHLORIDE 0.9 % IR SOLN
Status: DC | PRN
Start: 2015-03-11 — End: 2015-03-11
  Administered 2015-03-11: 6000 mL

## 2015-03-11 MED ORDER — HYDROMORPHONE HCL 1 MG/ML IJ SOLN: 0.2500 mg | INTRAMUSCULAR | Status: DC | PRN

## 2015-03-11 MED ORDER — HYDRALAZINE HCL 20 MG/ML IJ SOLN
5.0000 mg | Freq: Once | INTRAMUSCULAR | Status: AC
  Administered 2015-03-11: 5 mg via INTRAVENOUS

## 2015-03-11 MED ORDER — PROPOFOL 10 MG/ML IV BOLUS
INTRAVENOUS | Status: DC | PRN
  Administered 2015-03-11: 130 mg via INTRAVENOUS

## 2015-03-11 MED ORDER — PROMETHAZINE HCL 25 MG/ML IJ SOLN: 6.2500 mg | INTRAMUSCULAR | Status: DC | PRN

## 2015-03-11 MED ORDER — HYDRALAZINE HCL 20 MG/ML IJ SOLN
INTRAMUSCULAR | Status: AC
  Filled 2015-03-11: qty 1

## 2015-03-11 MED ORDER — CEFAZOLIN SODIUM-DEXTROSE 2-3 GM-% IV SOLR
2.0000 g | INTRAVENOUS | Status: AC
  Administered 2015-03-11: 2 g via INTRAVENOUS

## 2015-03-11 MED ORDER — LIDOCAINE-EPINEPHRINE (PF) 1.5 %-1:200000 IJ SOLN
INTRAMUSCULAR | Status: DC | PRN
  Administered 2015-03-11: 10 mL via PERINEURAL

## 2015-03-11 MED ORDER — LACTATED RINGERS IV SOLN: INTRAVENOUS | Status: DC

## 2015-03-11 MED ORDER — FENTANYL CITRATE (PF) 100 MCG/2ML IJ SOLN
INTRAMUSCULAR | Status: AC
  Filled 2015-03-11: qty 2

## 2015-03-11 MED ORDER — DEXAMETHASONE SODIUM PHOSPHATE 4 MG/ML IJ SOLN
INTRAMUSCULAR | Status: DC | PRN
  Administered 2015-03-11: 10 mg via INTRAVENOUS

## 2015-03-11 MED ORDER — SCOPOLAMINE 1 MG/3DAYS TD PT72
1.0000 | MEDICATED_PATCH | Freq: Once | TRANSDERMAL | Status: DC | PRN
Start: 2015-03-11 — End: 2015-03-11

## 2015-03-11 MED ORDER — ONDANSETRON HCL 4 MG/2ML IJ SOLN
INTRAMUSCULAR | Status: DC | PRN
  Administered 2015-03-11: 4 mg via INTRAVENOUS

## 2015-03-11 MED ORDER — SUCCINYLCHOLINE CHLORIDE 20 MG/ML IJ SOLN
INTRAMUSCULAR | Status: DC | PRN
  Administered 2015-03-11: 100 mg via INTRAVENOUS

## 2015-03-11 MED ORDER — OXYCODONE-ACETAMINOPHEN 5-325 MG PO TABS
1.0000 | ORAL_TABLET | ORAL | Status: DC | PRN
Start: 2015-03-11 — End: 2015-12-30

## 2015-03-11 MED ORDER — CEFAZOLIN SODIUM-DEXTROSE 2-3 GM-% IV SOLR
INTRAVENOUS | Status: AC
  Filled 2015-03-11: qty 50

## 2015-03-11 MED ORDER — MIDAZOLAM HCL 2 MG/2ML IJ SOLN
INTRAMUSCULAR | Status: AC
  Filled 2015-03-11: qty 2

## 2015-03-11 MED ORDER — MIDAZOLAM HCL 2 MG/2ML IJ SOLN
1.0000 mg | INTRAMUSCULAR | Status: DC | PRN
  Administered 2015-03-11: 2 mg via INTRAVENOUS

## 2015-03-11 MED ORDER — BUPIVACAINE HCL (PF) 0.5 % IJ SOLN
INTRAMUSCULAR | Status: DC | PRN
  Administered 2015-03-11: 20 mL via PERINEURAL

## 2015-03-11 MED ORDER — CHLORHEXIDINE GLUCONATE 4 % EX LIQD: 60.0000 mL | Freq: Once | CUTANEOUS | Status: DC

## 2015-03-11 MED ORDER — FENTANYL CITRATE (PF) 100 MCG/2ML IJ SOLN
50.0000 ug | INTRAMUSCULAR | Status: DC | PRN
  Administered 2015-03-11: 100 ug via INTRAVENOUS

## 2015-03-11 MED ORDER — GLYCOPYRROLATE 0.2 MG/ML IJ SOLN: 0.2000 mg | Freq: Once | INTRAMUSCULAR | Status: DC | PRN

## 2015-03-11 MED ORDER — LACTATED RINGERS IV SOLN
INTRAVENOUS | Status: DC
  Administered 2015-03-11 (×2): via INTRAVENOUS

## 2015-03-11 SURGICAL SUPPLY — 73 items
APL SKNCLS STERI-STRIP NONHPOA (GAUZE/BANDAGES/DRESSINGS)
BENZOIN TINCTURE PRP APPL 2/3 (GAUZE/BANDAGES/DRESSINGS) IMPLANT
BLADE CUTTER GATOR 3.5 (BLADE) ×3 IMPLANT
BLADE CUTTER MENIS 5.5 (BLADE) IMPLANT
BLADE GREAT WHITE 4.2 (BLADE) ×2 IMPLANT
BLADE GREAT WHITE 4.2MM (BLADE) ×1
BLADE SURG 15 STRL LF DISP TIS (BLADE) IMPLANT
BLADE SURG 15 STRL SS (BLADE)
BUR OVAL 6.0 (BURR) ×3 IMPLANT
CANNULA DRY DOC 8X75 (CANNULA) IMPLANT
CANNULA TWIST IN 8.25X7CM (CANNULA) IMPLANT
CLOSURE WOUND 1/2 X4 (GAUZE/BANDAGES/DRESSINGS)
DECANTER SPIKE VIAL GLASS SM (MISCELLANEOUS) IMPLANT
DRAPE STERI 35X30 U-POUCH (DRAPES) ×3 IMPLANT
DRAPE U-SHAPE 47X51 STRL (DRAPES) ×3 IMPLANT
DRAPE U-SHAPE 76X120 STRL (DRAPES) ×6 IMPLANT
DRSG PAD ABDOMINAL 8X10 ST (GAUZE/BANDAGES/DRESSINGS) ×3 IMPLANT
DURAPREP 26ML APPLICATOR (WOUND CARE) ×3 IMPLANT
ELECT MENISCUS 165MM 90D (ELECTRODE) ×3 IMPLANT
ELECT REM PT RETURN 9FT ADLT (ELECTROSURGICAL) ×3
ELECTRODE REM PT RTRN 9FT ADLT (ELECTROSURGICAL) ×1 IMPLANT
GAUZE SPONGE 4X4 12PLY STRL (GAUZE/BANDAGES/DRESSINGS) ×6 IMPLANT
GAUZE XEROFORM 1X8 LF (GAUZE/BANDAGES/DRESSINGS) ×3 IMPLANT
GLOVE BIO SURGEON STRL SZ 6.5 (GLOVE) ×1 IMPLANT
GLOVE BIO SURGEONS STRL SZ 6.5 (GLOVE) ×1
GLOVE BIOGEL PI IND STRL 7.0 (GLOVE) ×1 IMPLANT
GLOVE BIOGEL PI INDICATOR 7.0 (GLOVE) ×4
GLOVE ECLIPSE 7.0 STRL STRAW (GLOVE) ×3 IMPLANT
GLOVE SURG ORTHO 8.0 STRL STRW (GLOVE) ×3 IMPLANT
GOWN STRL REUS W/ TWL LRG LVL3 (GOWN DISPOSABLE) ×2 IMPLANT
GOWN STRL REUS W/ TWL XL LVL3 (GOWN DISPOSABLE) ×1 IMPLANT
GOWN STRL REUS W/TWL LRG LVL3 (GOWN DISPOSABLE) ×6
GOWN STRL REUS W/TWL XL LVL3 (GOWN DISPOSABLE) ×3
IV NS IRRIG 3000ML ARTHROMATIC (IV SOLUTION) ×12 IMPLANT
MANIFOLD NEPTUNE II (INSTRUMENTS) ×3 IMPLANT
NDL SCORPION MULTI FIRE (NEEDLE) IMPLANT
NDL SUT 6 .5 CRC .975X.05 MAYO (NEEDLE) IMPLANT
NEEDLE MAYO TAPER (NEEDLE)
NEEDLE SCORPION MULTI FIRE (NEEDLE) IMPLANT
NS IRRIG 1000ML POUR BTL (IV SOLUTION) IMPLANT
PACK ARTHROSCOPY DSU (CUSTOM PROCEDURE TRAY) ×3 IMPLANT
PACK BASIN DAY SURGERY FS (CUSTOM PROCEDURE TRAY) ×3 IMPLANT
PASSER SUT SWANSON 36MM LOOP (INSTRUMENTS) IMPLANT
PENCIL BUTTON HOLSTER BLD 10FT (ELECTRODE) ×3 IMPLANT
SET ARTHROSCOPY TUBING (MISCELLANEOUS) ×3
SET ARTHROSCOPY TUBING LN (MISCELLANEOUS) ×1 IMPLANT
SLEEVE SCD COMPRESS KNEE MED (MISCELLANEOUS) IMPLANT
SLING ARM FOAM STRAP LRG (SOFTGOODS) ×2 IMPLANT
SLING ARM IMMOBILIZER LRG (SOFTGOODS) IMPLANT
SLING ARM IMMOBILIZER MED (SOFTGOODS) IMPLANT
SLING ARM LRG ADULT FOAM STRAP (SOFTGOODS) IMPLANT
SLING ARM MED ADULT FOAM STRAP (SOFTGOODS) IMPLANT
SLING ARM XL FOAM STRAP (SOFTGOODS) IMPLANT
SPONGE LAP 4X18 X RAY DECT (DISPOSABLE) IMPLANT
STRIP CLOSURE SKIN 1/2X4 (GAUZE/BANDAGES/DRESSINGS) IMPLANT
SUCTION FRAZIER TIP 10 FR DISP (SUCTIONS) IMPLANT
SUT ETHIBOND 2 OS 4 DA (SUTURE) IMPLANT
SUT ETHILON 2 0 FS 18 (SUTURE) IMPLANT
SUT ETHILON 3 0 PS 1 (SUTURE) IMPLANT
SUT FIBERWIRE #2 38 T-5 BLUE (SUTURE)
SUT RETRIEVER MED (INSTRUMENTS) IMPLANT
SUT TIGER TAPE 7 IN WHITE (SUTURE) IMPLANT
SUT VIC AB 0 CT1 27 (SUTURE)
SUT VIC AB 0 CT1 27XBRD ANBCTR (SUTURE) IMPLANT
SUT VIC AB 2-0 SH 27 (SUTURE)
SUT VIC AB 2-0 SH 27XBRD (SUTURE) IMPLANT
SUT VIC AB 3-0 FS2 27 (SUTURE) IMPLANT
SUTURE FIBERWR #2 38 T-5 BLUE (SUTURE) IMPLANT
TAPE FIBER 2MM 7IN #2 BLUE (SUTURE) IMPLANT
TOWEL OR 17X24 6PK STRL BLUE (TOWEL DISPOSABLE) ×3 IMPLANT
TOWEL OR NON WOVEN STRL DISP B (DISPOSABLE) ×3 IMPLANT
WATER STERILE IRR 1000ML POUR (IV SOLUTION) ×3 IMPLANT
YANKAUER SUCT BULB TIP NO VENT (SUCTIONS) IMPLANT

## 2015-03-11 NOTE — Progress Notes (Signed)
Assisted Dr. Rose with right, ultrasound guided, interscalene  block. Side rails up, monitors on throughout procedure. See vital signs in flow sheet. Tolerated Procedure well. 

## 2015-03-11 NOTE — Transfer of Care (Signed)
Immediate Anesthesia Transfer of Care Note  Patient: [name] [name]  Procedure(s) Performed: Procedure(s): RIGHT SHOULDER ARTHROSCOPY DEBRIDEMENT  WITH ACROMIOPLASTY, DISTAL CLAVICLE EXCISION (Right) SHOULDER ARTHROSCOPY WITH BICEPSTENOTOMY (Right)  Patient Location: PACU  Anesthesia Type:GA combined with regional for post-op pain  Level of Consciousness: awake, alert  and oriented  Airway & Oxygen Therapy: Patient Spontanous Breathing and Patient connected to face mask oxygen  Post-op Assessment: Report given to RN and Post -op Vital signs reviewed and stable  Post vital signs: Reviewed and stable  Last Vitals:  Filed Vitals:   03/11/15 0730  BP:   Pulse: 67  Temp:   Resp: 20    Complications: No apparent anesthesia complications

## 2015-03-11 NOTE — Interval H&P Note (Signed)
History and Physical Interval Note:  03/11/2015 7:29 AM  [name] [name]  has presented today for surgery, with the diagnosis of RIGHT SHULDR,PRIMARY OA RIGHT SHOULDER, BURSITIS OR RIGHT SHOUDER  The various methods of treatment have been discussed with the patient and family. After consideration of risks, benefits and other options for treatment, the patient has consented to  Procedure(s): RIGHT SHOULDER ARTHROSCOPY DEBRIDEMENT  WITH ACROMIOPLASTY, DISTAL CLAVICLE EXCISION (Right) as a surgical intervention .  The patient's history has been reviewed, patient examined, no change in status, stable for surgery.  I have reviewed the patient's chart and labs.  Questions were answered to the patient's satisfaction.     [name] [name]

## 2015-03-11 NOTE — Anesthesia Postprocedure Evaluation (Signed)
  Anesthesia Post-op Note  Patient: [name] [name]  Procedure(s) Performed: Procedure(s) (LRB): RIGHT SHOULDER ARTHROSCOPY DEBRIDEMENT  WITH ACROMIOPLASTY, DISTAL CLAVICLE EXCISION (Right) SHOULDER ARTHROSCOPY WITH BICEPSTENOTOMY (Right)  Patient Location: PACU  Anesthesia Type: General  Level of Consciousness: awake and alert   Airway and Oxygen Therapy: Patient Spontanous Breathing  Post-op Pain: mild  Post-op Assessment: Post-op Vital signs reviewed, Patient's Cardiovascular Status Stable, Respiratory Function Stable, Patent Airway and No signs of Nausea or vomiting  Last Vitals:  Filed Vitals:   03/11/15 0930  BP: 189/95  Pulse:   Temp:   Resp:     Post-op Vital Signs: stable   Complications: No apparent anesthesia complications

## 2015-03-11 NOTE — Anesthesia Procedure Notes (Addendum)
Anesthesia Regional Block:  Interscalene brachial plexus block  Pre-Anesthetic Checklist: ,, timeout performed, Correct Patient, Correct Site, Correct Laterality, Correct Procedure, Correct Position, site marked, Risks and benefits discussed,  Surgical consent,  Pre-op evaluation,  At surgeon's request and post-op pain management  Laterality: Right  Prep: chloraprep       Needles:  Injection technique: Single-shot  Needle Type: Echogenic Stimulator Needle     Needle Length: 9cm 9 cm Needle Gauge: 21 and 21 G    Additional Needles:  Procedures: ultrasound guided (picture in chart) Interscalene brachial plexus block Narrative:  Injection made incrementally with aspirations every 5 mL.  Performed by: Personally  Anesthesiologist: ROSE, Iona Beard  Additional Notes: Patient tolerated the procedure well without complications   Procedure Name: Intubation Date/Time: 03/11/2015 7:43 AM Performed by: Maryella Shivers Pre-anesthesia Checklist: Patient identified, Emergency Drugs available, Suction available and Patient being monitored Patient Re-evaluated:Patient Re-evaluated prior to inductionOxygen Delivery Method: Circle System Utilized Preoxygenation: Pre-oxygenation with 100% oxygen Intubation Type: IV induction Ventilation: Mask ventilation without difficulty Laryngoscope Size: Mac and 3 Grade View: Grade I Tube type: Oral Tube size: 8.0 mm Number of attempts: 1 Airway Equipment and Method: Stylet and Oral airway Placement Confirmation: ETT inserted through vocal cords under direct vision,  positive ETCO2 and breath sounds checked- equal and bilateral Secured at: 21 cm Tube secured with: Tape Dental Injury: Teeth and Oropharynx as per pre-operative assessment

## 2015-03-11 NOTE — Discharge Instructions (Signed)
Shouder arthroscopy, partial rotator cuff tear debridement subacromial decompression °Care After Instructions °Refer to this sheet in the next few weeks. These discharge instructions provide you with general information on caring for yourself after you leave the hospital. Your caregiver may also give you specific instructions. Your treatment has been planned according to the most current medical practices available, but unavoidable complications sometimes occur. If you have any problems or questions after discharge, please call your caregiver. °HOME INSTRUCTIONS °You may resume a normal diet and activities as directed. Take showers instead of baths until informed otherwise.  °Change bandages (dressings) in 3 days.  Swab wounds daily with betadine.  Wash shoulder with soap and water.  Pat dry.  Cover wounds with bandaids. °Only take over-the-counter or prescription medicines for pain, discomfort, or fever as directed by your caregiver.  °Wear your sling for the next 2 days unless otherwise instructed. °Eat a well-balanced diet.  °Avoid lifting or driving until you are instructed otherwise.  °Make an appointment to see your caregiver for stitches (suture) or staple removal one week after surgery. ° °SEEK MEDICAL CARE IF: °You have swelling of your calf or leg.  °You develop shortness of breath or chest pain.  °You have redness, swelling, or increasing pain in the wound.  °There is pus or any unusual drainage coming from the surgical site.  °You notice a bad smell coming from the surgical site or dressing.  °The surgical site breaks open after sutures or staples have been removed.  °There is persistent bleeding from the suture or staple line.  °You are getting worse or are not improving.  °You have any other questions or concerns.  °SEEK IMMEDIATE MEDICAL CARE IF:  °You have a fever greater than 101 °You develop a rash.  °You have difficulty breathing.  °You develop any reaction or side effects to medicines given.    °Your knee motion is decreasing rather than improving.  °MAKE SURE YOU:  °Understand these instructions.  °Will watch your condition.  °Will get help right away if you are not doing well or get worse.  ° °Post Anesthesia Home Care Instructions ° °Activity: °Get plenty of rest for the remainder of the day. A responsible adult should stay with you for 24 hours following the procedure.  °For the next 24 hours, DO NOT: °-Drive a car °-Operate machinery °-Drink alcoholic beverages °-Take any medication unless instructed by your physician °-Make any legal decisions or sign important papers. ° °Meals: °Start with liquid foods such as gelatin or soup. Progress to regular foods as tolerated. Avoid greasy, spicy, heavy foods. If nausea and/or vomiting occur, drink only clear liquids until the nausea and/or vomiting subsides. Call your physician if vomiting continues. ° °Special Instructions/Symptoms: °Your throat may feel dry or sore from the anesthesia or the breathing tube placed in your throat during surgery. If this causes discomfort, gargle with warm salt water. The discomfort should disappear within 24 hours. ° °If you had a scopolamine patch placed behind your ear for the management of post- operative nausea and/or vomiting: ° °1. The medication in the patch is effective for 72 hours, after which it should be removed.  Wrap patch in a tissue and discard in the trash. Wash hands thoroughly with soap and water. °2. You may remove the patch earlier than 72 hours if you experience unpleasant side effects which may include dry mouth, dizziness or visual disturbances. °3. Avoid touching the patch. Wash your hands with soap and water after contact with   the patch. ° ° °  °Regional Anesthesia Blocks ° °1. Numbness or the inability to move the "blocked" extremity may last from 3-48 hours after placement. The length of time depends on the medication injected and your individual response to the medication. If the numbness is not  going away after 48 hours, call your surgeon. ° °2. The extremity that is blocked will need to be protected until the numbness is gone and the  Strength has returned. Because you cannot feel it, you will need to take extra care to avoid injury. Because it may be weak, you may have difficulty moving it or using it. You may not know what position it is in without looking at it while the block is in effect. ° °3. For blocks in the legs and feet, returning to weight bearing and walking needs to be done carefully. You will need to wait until the numbness is entirely gone and the strength has returned. You should be able to move your leg and foot normally before you try and bear weight or walk. You will need someone to be with you when you first try to ensure you do not fall and possibly risk injury. ° °4. Bruising and tenderness at the needle site are common side effects and will resolve in a few days. ° °5. Persistent numbness or new problems with movement should be communicated to the surgeon or the [facility] ([phone])/ [facility] ([phone]). °

## 2015-03-11 NOTE — Anesthesia Preprocedure Evaluation (Addendum)
Anesthesia Evaluation  Patient identified by MRN, date of birth, ID band Patient awake    Reviewed: Allergy & Precautions, NPO status , Patient's Chart, lab work & pertinent test results  History of Anesthesia Complications Negative for: history of anesthetic complications  Airway Mallampati: II  TM Distance: >3 FB Neck ROM: Full    Dental no notable dental hx.    Pulmonary Current Smoker,    Pulmonary exam normal breath sounds clear to auscultation       Cardiovascular hypertension, Pt. on medications Normal cardiovascular exam Rhythm:Regular Rate:Normal     Neuro/Psych negative neurological ROS  negative psych ROS   GI/Hepatic negative GI ROS, Neg liver ROS,   Endo/Other  negative endocrine ROS  Renal/GU negative Renal ROS  negative genitourinary   Musculoskeletal  (+) Arthritis , Rheumatoid disorders,    Abdominal   Peds negative pediatric ROS (+)  Hematology negative hematology ROS (+)   Anesthesia Other Findings   Reproductive/Obstetrics negative OB ROS                           Anesthesia Physical Anesthesia Plan  ASA: III  Anesthesia Plan: General   Post-op Pain Management: GA combined w/ Regional for post-op pain   Induction: Intravenous  Airway Management Planned: Oral ETT  Additional Equipment:   Intra-op Plan:   Post-operative Plan: Extubation in OR  Informed Consent: I have reviewed the patients History and Physical, chart, labs and discussed the procedure including the risks, benefits and alternatives for the proposed anesthesia with the patient or authorized representative who has indicated his/her understanding and acceptance.   Dental advisory given  Plan Discussed with: CRNA and Surgeon  Anesthesia Plan Comments:         Anesthesia Quick Evaluation

## 2015-03-12 ENCOUNTER — Encounter (HOSPITAL_BASED_OUTPATIENT_CLINIC_OR_DEPARTMENT_OTHER): Payer: Self-pay | Admitting: Orthopedic Surgery

## 2015-03-12 NOTE — Op Note (Signed)
NAMEMarland [name]  ELVER, STADLER NO.:  [id number]  MEDICAL RECORD NO.:  [id number]  LOCATION:                               FACILITY:  [facility]  PHYSICIAN:  Ninetta Lights, M.D. DATE OF BIRTH:  [birth year]  DATE OF PROCEDURE:  03/11/2015 DATE OF DISCHARGE:  03/11/2015                              OPERATIVE REPORT   PREOPERATIVE DIAGNOSIS:  Right shoulder impingement partial tearing rotator cuff.  Degenerative joint disease (DJD), acromioclavicular (AC) joint.  Underlying diagnosis of rheumatoid arthritis.  POSTOPERATIVE DIAGNOSIS:  Right shoulder impingement partial tearing rotator cuff.  Degenerative joint disease (DJD), acromioclavicular (AC) joint.  Underlying diagnosis of rheumatoid arthritis, with extensive reactive synovitis and degenerative changes in the shoulder.  Diffuse grade III over the entire humeral head, lesser extent on the glenoid. Numerous chondral loose bodies.  Circumferential tearing labrum.  Near complete tear long head biceps tendon.  Grade 4 changes acromioclavicular (AC) joint.  PROCEDURE:  Right shoulder exam under anesthesia, arthroscopy. Chondroplasty glenohumeral joint, removal of loose bodies.  Debridement of labrum.  Released resection of intra-articular portion of biceps tendon, allowing it recess of the bicipital groove.  Debridement of rotator cuff above and below.  Bursectomy, acromioplasty, CA ligament release.  Excision of distal clavicle.  SURGEON:  Ninetta Lights, M.D.  ASSISTANT:  Elmyra Ricks, PA., present throughout the entire case and necessary for timely completion of procedure.  ANESTHESIA:  General.  BLOOD LOSS:  Minimal.  SPECIMENS:  None.  CULTURES:  None.  COMPLICATIONS:  None.  DRESSINGS:  Soft compressive with sling.  PROCEDURE IN DETAIL:  The patient was brought to operating room, placed on the operating table in supine position.  After adequate anesthesia had been obtained, shoulder examined.   Lacking about 10% of motion. This turned out to be more from degenerative changes and 2 adhesions. He could be manipulated in full motion, placed in a beach-chair position on the shoulder positioner, prepped and draped in usual sterile fashion. Three portals anterior posterior lateral.  Arthroscope was introduced, shoulder distended and inspected.  Numerous chondral loose bodies were removed.  Chondroplasty for extensive changes on the humerus or the glenoid.  Circumferential labral tear is debrided.  A few strands of biceps still intact.  These were released, then a tendon allowed to be released after resection down in the bicipital groove.  Fitting striations of the rotator cuff, but no full-thickness tears.  Fair amount of synovitis debrided.  Cannula redirected subacromially.  The top of the cuff did not look as bad just an abrasion.  Bursa resected. Acromioplasty from type 3 to type 1 acromion releasing CA ligament. Grade 4 changes with spurring AC joint.  Periarticular spurs lateral centimeter of clavicle resected.  Adequacy of deep compression debridement confirmed viewing from all portals.  Instruments were removed.  Portals were closed with nylon.  Sterile compressive dressing applied.  Sling applied.  Anesthesia reversed.  Brought to the recovery room.  Tolerated the surgery well.  No complications.     Ninetta Lights, M.D.     DFM/MEDQ  D:  03/11/2015  T:  03/11/2015  Job:  [id number]

## 2015-04-27 DIAGNOSIS — G894 Chronic pain syndrome: Secondary | ICD-10-CM | POA: Diagnosis not present

## 2015-04-27 DIAGNOSIS — M47817 Spondylosis without myelopathy or radiculopathy, lumbosacral region: Secondary | ICD-10-CM | POA: Diagnosis not present

## 2015-04-27 DIAGNOSIS — M1288 Other specific arthropathies, not elsewhere classified, other specified site: Secondary | ICD-10-CM | POA: Diagnosis not present

## 2015-04-27 DIAGNOSIS — M545 Low back pain: Secondary | ICD-10-CM | POA: Diagnosis not present

## 2015-04-27 DIAGNOSIS — Z79899 Other long term (current) drug therapy: Secondary | ICD-10-CM | POA: Diagnosis not present

## 2015-05-04 DIAGNOSIS — M25512 Pain in left shoulder: Secondary | ICD-10-CM | POA: Diagnosis not present

## 2015-05-04 DIAGNOSIS — M0579 Rheumatoid arthritis with rheumatoid factor of multiple sites without organ or systems involvement: Secondary | ICD-10-CM | POA: Diagnosis not present

## 2015-05-04 DIAGNOSIS — Z79899 Other long term (current) drug therapy: Secondary | ICD-10-CM | POA: Diagnosis not present

## 2015-05-04 DIAGNOSIS — M2550 Pain in unspecified joint: Secondary | ICD-10-CM | POA: Diagnosis not present

## 2015-05-07 ENCOUNTER — Encounter: Payer: Medicare HMO | Admitting: Internal Medicine

## 2015-05-07 DIAGNOSIS — Z79899 Other long term (current) drug therapy: Secondary | ICD-10-CM | POA: Diagnosis not present

## 2015-05-07 DIAGNOSIS — M47817 Spondylosis without myelopathy or radiculopathy, lumbosacral region: Secondary | ICD-10-CM | POA: Diagnosis not present

## 2015-05-07 DIAGNOSIS — G894 Chronic pain syndrome: Secondary | ICD-10-CM | POA: Diagnosis not present

## 2015-05-11 DIAGNOSIS — M5136 Other intervertebral disc degeneration, lumbar region: Secondary | ICD-10-CM | POA: Diagnosis not present

## 2015-05-11 DIAGNOSIS — M4317 Spondylolisthesis, lumbosacral region: Secondary | ICD-10-CM | POA: Diagnosis not present

## 2015-05-25 DIAGNOSIS — M5416 Radiculopathy, lumbar region: Secondary | ICD-10-CM | POA: Diagnosis not present

## 2015-05-26 DIAGNOSIS — M1288 Other specific arthropathies, not elsewhere classified, other specified site: Secondary | ICD-10-CM | POA: Diagnosis not present

## 2015-05-26 DIAGNOSIS — M47817 Spondylosis without myelopathy or radiculopathy, lumbosacral region: Secondary | ICD-10-CM | POA: Diagnosis not present

## 2015-05-26 DIAGNOSIS — M5137 Other intervertebral disc degeneration, lumbosacral region: Secondary | ICD-10-CM | POA: Diagnosis not present

## 2015-05-26 DIAGNOSIS — G894 Chronic pain syndrome: Secondary | ICD-10-CM | POA: Diagnosis not present

## 2015-05-26 DIAGNOSIS — Z79899 Other long term (current) drug therapy: Secondary | ICD-10-CM | POA: Diagnosis not present

## 2015-06-01 ENCOUNTER — Ambulatory Visit (INDEPENDENT_AMBULATORY_CARE_PROVIDER_SITE_OTHER): Payer: Medicare Other | Admitting: Internal Medicine

## 2015-06-01 ENCOUNTER — Encounter: Payer: Self-pay | Admitting: Internal Medicine

## 2015-06-01 VITALS — BP 146/95 | HR 77 | Temp 98.2°F | Ht 68.0 in | Wt 183.2 lb

## 2015-06-01 DIAGNOSIS — G4700 Insomnia, unspecified: Secondary | ICD-10-CM

## 2015-06-01 DIAGNOSIS — M25512 Pain in left shoulder: Secondary | ICD-10-CM | POA: Diagnosis not present

## 2015-06-01 DIAGNOSIS — M25511 Pain in right shoulder: Secondary | ICD-10-CM | POA: Diagnosis not present

## 2015-06-01 DIAGNOSIS — Z23 Encounter for immunization: Secondary | ICD-10-CM | POA: Diagnosis not present

## 2015-06-01 DIAGNOSIS — Z720 Tobacco use: Secondary | ICD-10-CM | POA: Diagnosis not present

## 2015-06-01 HISTORY — DX: Insomnia, unspecified: G47.00

## 2015-06-01 MED ORDER — ZOLPIDEM TARTRATE ER 6.25 MG PO TBCR
6.2500 mg | EXTENDED_RELEASE_TABLET | Freq: Every evening | ORAL | Status: DC | PRN
Start: 2015-06-01 — End: 2015-08-20

## 2015-06-01 NOTE — Progress Notes (Signed)
Subjective:   Patient ID: Halo Wiltfong male   DOB: 1960-08-21 54 y.o.   MRN: [id number]  HPI: Mr.Miklos Greason is a 54 y.o.  man with past medical history as described below presenting with a complaint of insomnia. Mr. Latter has been having increasing trouble sleeping for about the last 6 months. He does not have trouble with sleep latency however he wakes up only 3-5 hours after falling asleep and is unable to return to sleeping. He has tried over-the-counter melatonin for sleep aid but states he felt drowsy following its use and discontinued this. He has not been napping during the day and does not report excessive daytime somnolence. He does not have episodes of nocturia. 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  See problem based assessment and plan below for additional details.   Past Medical History  Diagnosis Date  . Lupus ([place])     ied forward by the bloody EMR.   . History of anemia     no current med.  [name] GERD (gastroesophageal reflux disease)   . Hypertension     states is borderline on medication; has been on med. x 5-6 yr.  . Heart murmur     states no problems, no cardiologist  . 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 Impingement syndrome of shoulder region 10/2014    left  . Strain of tendon of left rotator cuff 10/2014  . RA (rheumatoid arthritis) ([place])   . Osteoarthritis     left shoulder  . 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 Abrasion of left thumb 11/16/2014   Current Outpatient Prescriptions  Medication Sig Dispense Refill  . ENBREL SURECLICK 50 MG/ML injection Inject 50 mg into the skin once a week.    . ferrous sulfate 325 (65 FE) MG tablet Take 325 mg by mouth daily with breakfast.    . leflunomide (ARAVA) 10 MG  tablet Take 10 mg by mouth daily.    . nebivolol (BYSTOLIC) 5 MG tablet Take 1 tablet (5 mg total) by mouth daily. 90 tablet 1  . ondansetron (ZOFRAN) 4 MG tablet Take 1 tablet (4 mg total) by mouth every 8 (eight) hours as needed for nausea or vomiting. 40 tablet 0  . oxyCODONE-acetaminophen (ROXICET) 5-325 MG per tablet Take 1-2 tablets by mouth every 4 (four) hours as needed. 60 tablet 0  . pantoprazole (PROTONIX) 40 MG tablet TAKE 1 TABLET (40 MG TOTAL) BY MOUTH 2 (TWO) TIMES DAILY. 60 tablet 2   No current facility-administered medications for this visit.   Family History  Problem Relation Age of Onset  . Stroke Neg Hx   . Cancer Neg Hx    Social History   Social History  . Marital Status: Single    Spouse Name: N/A  . Number of Children: N/A  . Years of Education: N/A   Social History Main Topics  . Smoking status: Current Every Day Smoker -- 20 years    Types: Cigars  . Smokeless tobacco: Never Used     Comment: 2-3 cig./day  . Alcohol Use: No  . Drug Use: No  . Sexual Activity: Not Asked   Other Topics Concern  . None   Social History Narrative   Patient lives  by himself, is on disability since 1992, used to work with city of [place]. 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 Smokes 1/2 PPD and has been smoking since 25 years. Drinks only occasionally.         Review of Systems: Review of Systems  Constitutional: Negative for fever, chills, weight loss and malaise/fatigue.  Respiratory: Negative for shortness of breath.   Cardiovascular: Negative for chest pain and palpitations.  Genitourinary: Negative for frequency.  Neurological: Negative for dizziness and headaches.  Psychiatric/Behavioral: Positive for substance abuse. The patient is nervous/anxious and has insomnia.     Objective:  Physical Exam: Filed Vitals:   06/01/15 1507  BP: 146/95  Pulse: 77  Temp: 98.2 F (36.8 C)  TempSrc: Oral  Height: 5\' 8"  (1.727 m)  Weight: 183 lb 3.2 oz (83.099 kg)  SpO2: 98%     GENERAL- alert, co-operative, NAD HEENT- Atraumatic, PERRL, EOMI, oral mucosa appears moist, good and intact dentition, no cervical LN enlargement. CARDIAC- RRR, no murmurs, rubs or gallops. RESP- CTAB, no wheezes or crackles. EXTREMITIES- shoulders are mildly tender to palpation and on abduction against resistance, however has full range of motion and 5 out of 5 strength SKIN- Warm, dry, No rash or lesion. PSYCH- Normal mood and affect, appropriate thought content and speech.  Assessment & Plan:

## 2015-06-01 NOTE — Patient Instructions (Addendum)
Today we've prescribed a trial dose of Ambien, a sleep aiding medicine that you can take at least one hour before you intend to sleep at night as needed for insomnia.  If you find yourself excessively drowsy during the day after this medicine you should try taking it earlier or discontinue this.  Other medicine that may be an alternative would be over the counter Benadryl (diphenhydramine) 25mg .  Keep doing a good job staying active and contact us sooner if you have questions or need medications refilled. [phone].

## 2015-06-02 NOTE — Assessment & Plan Note (Signed)
  Assessment: Progress toward smoking cessation:  Patient has regressed in his progress Barriers to progress toward smoking cessation:  Life stressors Comments: The patient had previously progressed down to 1 pack per day or less smoking but now reports he is back to 2-2.5 packs per day of cigarettes. He states that when doing well he will smoke infrequently but then when stressing out finds himself chain-smoking up to a pack at a time.  Plan: Instruction/counseling given:  I counseled patient on the dangers of tobacco use, advised patient to stop smoking, and reviewed strategies to maximize success. Informed him that the smoking is also probably related to anxiety and that there are multiple pharmacological agents for cessation that we could try if he needed. Medications to assist with smoking cessation:  Patient refused trying medications for smoking cessation at this time, says they have not helped him in the past and  "I do better just trying to quit cold [place]"  Other plans: need to revisit his smoking cessation plan hopefully once she is having less other health factors leading to be addressed and less stressors leading to relapse

## 2015-06-02 NOTE — Assessment & Plan Note (Signed)
His bilateral shoulder pain related to chronic on inflammatory arthritis is doing well status post bilateral shoulder replacement surgery. He is functionally able to do work and chores without problem and has very minimal pain reproducible on physical exam with palpation or resistance to motion. Currently managed by pain clinic for opioids since his surgery. continues to take Enbrel and Arava for his autoimmune arthritis.

## 2015-06-02 NOTE — Assessment & Plan Note (Signed)
Influenza vaccine administered today.

## 2015-06-02 NOTE — Progress Notes (Signed)
Internal Medicine Clinic Attending  I saw and evaluated the patient.  I personally confirmed the key portions of the history and exam documented by Dr. Rice and I reviewed pertinent patient test results.  The assessment, diagnosis, and plan were formulated together and I agree with the documentation in the resident's note.  

## 2015-06-02 NOTE — Assessment & Plan Note (Signed)
Assessment: Patient is having moderate severity insomnia lasting approximate 6 months now. He seems to be following appropriate sleep hygiene, although he is reporting excessive tobacco use often at night before sleeping. He has tried over-the-counter melatonin sleep aid already without good results. On review of his recent life events there is almost certainly a component of anxiety contributing to this insomnia. He also endorses feeling "on edge" but feels she sometimes can't figure out "what am I worrying about." This has lasted 6 months duration or longer now may be a  manifestation of generalized anxiety disorder. On discussion that this insomnia may be from anxiety anxiety with the patient, his GAD-7 score was 11 from feeling on edge, unable to relax, restless, and unable to stop his nonspecific worrying. He was very uninterested in considering pharmacotherapy for anxiety at this time.  Plan: Prescribed Ambien 6.25 mg at night #30 tabs Reinforce plan for good sleep hygiene If insomnia is not improved probably needs to discuss his anxiety again at next appointment

## 2015-06-09 DIAGNOSIS — M47817 Spondylosis without myelopathy or radiculopathy, lumbosacral region: Secondary | ICD-10-CM | POA: Diagnosis not present

## 2015-07-07 DIAGNOSIS — Z79899 Other long term (current) drug therapy: Secondary | ICD-10-CM | POA: Diagnosis not present

## 2015-07-07 DIAGNOSIS — G894 Chronic pain syndrome: Secondary | ICD-10-CM | POA: Diagnosis not present

## 2015-07-07 DIAGNOSIS — M47817 Spondylosis without myelopathy or radiculopathy, lumbosacral region: Secondary | ICD-10-CM | POA: Diagnosis not present

## 2015-08-04 DIAGNOSIS — M0579 Rheumatoid arthritis with rheumatoid factor of multiple sites without organ or systems involvement: Secondary | ICD-10-CM | POA: Diagnosis not present

## 2015-08-04 DIAGNOSIS — Z79899 Other long term (current) drug therapy: Secondary | ICD-10-CM | POA: Diagnosis not present

## 2015-08-04 DIAGNOSIS — M2550 Pain in unspecified joint: Secondary | ICD-10-CM | POA: Diagnosis not present

## 2015-08-04 DIAGNOSIS — M25512 Pain in left shoulder: Secondary | ICD-10-CM | POA: Diagnosis not present

## 2015-08-18 DIAGNOSIS — M5137 Other intervertebral disc degeneration, lumbosacral region: Secondary | ICD-10-CM | POA: Diagnosis not present

## 2015-08-18 DIAGNOSIS — M1288 Other specific arthropathies, not elsewhere classified, other specified site: Secondary | ICD-10-CM | POA: Diagnosis not present

## 2015-08-18 DIAGNOSIS — Z79899 Other long term (current) drug therapy: Secondary | ICD-10-CM | POA: Diagnosis not present

## 2015-08-18 DIAGNOSIS — G894 Chronic pain syndrome: Secondary | ICD-10-CM | POA: Diagnosis not present

## 2015-08-18 DIAGNOSIS — M47817 Spondylosis without myelopathy or radiculopathy, lumbosacral region: Secondary | ICD-10-CM | POA: Diagnosis not present

## 2015-08-20 ENCOUNTER — Other Ambulatory Visit: Payer: Self-pay | Admitting: Internal Medicine

## 2015-08-23 ENCOUNTER — Other Ambulatory Visit: Payer: Self-pay | Admitting: Internal Medicine

## 2015-08-24 NOTE — Telephone Encounter (Signed)
Phoned in.

## 2015-08-25 DIAGNOSIS — M5137 Other intervertebral disc degeneration, lumbosacral region: Secondary | ICD-10-CM | POA: Diagnosis not present

## 2015-09-15 DIAGNOSIS — M1288 Other specific arthropathies, not elsewhere classified, other specified site: Secondary | ICD-10-CM | POA: Diagnosis not present

## 2015-09-15 DIAGNOSIS — M47817 Spondylosis without myelopathy or radiculopathy, lumbosacral region: Secondary | ICD-10-CM | POA: Diagnosis not present

## 2015-09-15 DIAGNOSIS — Z79899 Other long term (current) drug therapy: Secondary | ICD-10-CM | POA: Diagnosis not present

## 2015-09-15 DIAGNOSIS — M5137 Other intervertebral disc degeneration, lumbosacral region: Secondary | ICD-10-CM | POA: Diagnosis not present

## 2015-09-15 DIAGNOSIS — G894 Chronic pain syndrome: Secondary | ICD-10-CM | POA: Diagnosis not present

## 2015-09-22 DIAGNOSIS — M25552 Pain in left hip: Secondary | ICD-10-CM | POA: Diagnosis not present

## 2015-09-22 DIAGNOSIS — M5137 Other intervertebral disc degeneration, lumbosacral region: Secondary | ICD-10-CM | POA: Diagnosis not present

## 2015-10-05 ENCOUNTER — Other Ambulatory Visit: Payer: Self-pay | Admitting: Internal Medicine

## 2015-10-05 NOTE — Telephone Encounter (Signed)
Dec 2016 ov note said pt to RTO in 3 mon. - next ov 7/17

## 2015-10-06 NOTE — Telephone Encounter (Signed)
Called to pharm 

## 2015-10-06 NOTE — Telephone Encounter (Signed)
Ideally I would see him sooner but I have no open appointments earlier than July. However as we will be discussing weaning of hypnotics and management of his chronic anxiety it would be best to not change the appointment to a different resident.

## 2015-10-13 DIAGNOSIS — Z79891 Long term (current) use of opiate analgesic: Secondary | ICD-10-CM | POA: Diagnosis not present

## 2015-10-13 DIAGNOSIS — M25559 Pain in unspecified hip: Secondary | ICD-10-CM | POA: Diagnosis not present

## 2015-10-13 DIAGNOSIS — M545 Low back pain: Secondary | ICD-10-CM | POA: Diagnosis not present

## 2015-10-13 DIAGNOSIS — G894 Chronic pain syndrome: Secondary | ICD-10-CM | POA: Diagnosis not present

## 2015-10-13 DIAGNOSIS — Z79899 Other long term (current) drug therapy: Secondary | ICD-10-CM | POA: Diagnosis not present

## 2015-10-20 DIAGNOSIS — M7060 Trochanteric bursitis, unspecified hip: Secondary | ICD-10-CM | POA: Diagnosis not present

## 2015-10-20 DIAGNOSIS — M545 Low back pain: Secondary | ICD-10-CM | POA: Diagnosis not present

## 2015-10-20 DIAGNOSIS — M25559 Pain in unspecified hip: Secondary | ICD-10-CM | POA: Diagnosis not present

## 2015-10-25 DIAGNOSIS — M5137 Other intervertebral disc degeneration, lumbosacral region: Secondary | ICD-10-CM | POA: Diagnosis not present

## 2015-11-02 DIAGNOSIS — M2550 Pain in unspecified joint: Secondary | ICD-10-CM | POA: Diagnosis not present

## 2015-11-02 DIAGNOSIS — M25512 Pain in left shoulder: Secondary | ICD-10-CM | POA: Diagnosis not present

## 2015-11-02 DIAGNOSIS — M0579 Rheumatoid arthritis with rheumatoid factor of multiple sites without organ or systems involvement: Secondary | ICD-10-CM | POA: Diagnosis not present

## 2015-11-02 DIAGNOSIS — Z79899 Other long term (current) drug therapy: Secondary | ICD-10-CM | POA: Diagnosis not present

## 2015-11-08 DIAGNOSIS — Z79899 Other long term (current) drug therapy: Secondary | ICD-10-CM | POA: Diagnosis not present

## 2015-11-10 DIAGNOSIS — M5137 Other intervertebral disc degeneration, lumbosacral region: Secondary | ICD-10-CM | POA: Diagnosis not present

## 2015-11-10 DIAGNOSIS — Z79899 Other long term (current) drug therapy: Secondary | ICD-10-CM | POA: Diagnosis not present

## 2015-11-10 DIAGNOSIS — Z79891 Long term (current) use of opiate analgesic: Secondary | ICD-10-CM | POA: Diagnosis not present

## 2015-11-10 DIAGNOSIS — M1288 Other specific arthropathies, not elsewhere classified, other specified site: Secondary | ICD-10-CM | POA: Diagnosis not present

## 2015-11-10 DIAGNOSIS — M47817 Spondylosis without myelopathy or radiculopathy, lumbosacral region: Secondary | ICD-10-CM | POA: Diagnosis not present

## 2015-11-10 DIAGNOSIS — G894 Chronic pain syndrome: Secondary | ICD-10-CM | POA: Diagnosis not present

## 2015-11-16 ENCOUNTER — Other Ambulatory Visit (HOSPITAL_COMMUNITY): Payer: Self-pay | Admitting: *Deleted

## 2015-11-16 DIAGNOSIS — M0579 Rheumatoid arthritis with rheumatoid factor of multiple sites without organ or systems involvement: Secondary | ICD-10-CM | POA: Diagnosis not present

## 2015-11-16 DIAGNOSIS — M25512 Pain in left shoulder: Secondary | ICD-10-CM | POA: Diagnosis not present

## 2015-11-16 DIAGNOSIS — M2550 Pain in unspecified joint: Secondary | ICD-10-CM | POA: Diagnosis not present

## 2015-11-16 DIAGNOSIS — Z79899 Other long term (current) drug therapy: Secondary | ICD-10-CM | POA: Diagnosis not present

## 2015-11-17 ENCOUNTER — Encounter (HOSPITAL_COMMUNITY)
Admission: RE | Admit: 2015-11-17 | Discharge: 2015-11-17 | Disposition: A | Discharge status: Home / Self Care | Payer: Medicare Other | Source: Ambulatory Visit | Attending: Rheumatology | Admitting: Rheumatology

## 2015-11-17 DIAGNOSIS — M069 Rheumatoid arthritis, unspecified: Secondary | ICD-10-CM | POA: Diagnosis not present

## 2015-11-17 MED ORDER — ACETAMINOPHEN 325 MG PO TABS
ORAL_TABLET | ORAL | Status: AC
  Administered 2015-11-17: 650 mg
  Filled 2015-11-17: qty 2

## 2015-11-17 MED ORDER — INFLIXIMAB-DYYB 100 MG IV SOLR
INTRAVENOUS | Status: AC
  Administered 2015-11-17: 09:00:00 via INTRAVENOUS
  Filled 2015-11-17 (×2): qty 3

## 2015-11-17 MED ORDER — SODIUM CHLORIDE 0.9 % IV SOLN
INTRAVENOUS | Status: DC
  Administered 2015-11-17: 09:00:00 via INTRAVENOUS

## 2015-11-17 MED ORDER — ACETAMINOPHEN 325 MG PO TABS: 650.0000 mg | ORAL_TABLET | ORAL | Status: DC

## 2015-11-19 MED FILL — Infliximab-dyyb For IV Inj 100 MG: INTRAVENOUS | Qty: 30 | Status: AC

## 2015-11-19 MED FILL — Sodium Chloride IV Soln 0.9%: INTRAVENOUS | Qty: 250 | Status: AC

## 2015-11-26 ENCOUNTER — Encounter: Payer: Medicare Other | Admitting: Internal Medicine

## 2015-11-30 ENCOUNTER — Other Ambulatory Visit (HOSPITAL_COMMUNITY): Payer: Self-pay | Admitting: *Deleted

## 2015-12-01 ENCOUNTER — Encounter (HOSPITAL_COMMUNITY)
Admission: RE | Admit: 2015-12-01 | Discharge: 2015-12-01 | Disposition: A | Discharge status: Home / Self Care | Payer: Medicare Other | Source: Ambulatory Visit | Attending: Rheumatology | Admitting: Rheumatology

## 2015-12-01 DIAGNOSIS — M069 Rheumatoid arthritis, unspecified: Secondary | ICD-10-CM | POA: Insufficient documentation

## 2015-12-01 MED ORDER — LORATADINE 10 MG PO TABS
ORAL_TABLET | ORAL | Status: AC
  Administered 2015-12-01: 10 mg
  Filled 2015-12-01: qty 1

## 2015-12-01 MED ORDER — ACETAMINOPHEN 325 MG PO TABS: 650.0000 mg | ORAL_TABLET | ORAL | Status: DC

## 2015-12-01 MED ORDER — INFLIXIMAB-DYYB 100 MG IV SOLR: 300.0000 mg | INTRAVENOUS | Status: DC

## 2015-12-01 MED ORDER — LORATADINE 10 MG PO TABS: 10.0000 mg | ORAL_TABLET | Freq: Once | ORAL | Status: DC

## 2015-12-01 MED ORDER — SODIUM CHLORIDE 0.9 % IV SOLN
3.0000 mg/kg | INTRAVENOUS | Status: AC
  Administered 2015-12-01: 200 mg via INTRAVENOUS
  Filled 2015-12-01: qty 20

## 2015-12-01 MED ORDER — SODIUM CHLORIDE 0.9 % IV SOLN
INTRAVENOUS | Status: AC
  Administered 2015-12-01: 10:00:00 via INTRAVENOUS

## 2015-12-03 DIAGNOSIS — M47817 Spondylosis without myelopathy or radiculopathy, lumbosacral region: Secondary | ICD-10-CM | POA: Diagnosis not present

## 2015-12-03 DIAGNOSIS — M5137 Other intervertebral disc degeneration, lumbosacral region: Secondary | ICD-10-CM | POA: Diagnosis not present

## 2015-12-03 DIAGNOSIS — M1288 Other specific arthropathies, not elsewhere classified, other specified site: Secondary | ICD-10-CM | POA: Diagnosis not present

## 2015-12-03 DIAGNOSIS — M545 Low back pain: Secondary | ICD-10-CM | POA: Diagnosis not present

## 2015-12-07 DIAGNOSIS — I10 Essential (primary) hypertension: Secondary | ICD-10-CM | POA: Diagnosis not present

## 2015-12-07 DIAGNOSIS — M0579 Rheumatoid arthritis with rheumatoid factor of multiple sites without organ or systems involvement: Secondary | ICD-10-CM | POA: Diagnosis not present

## 2015-12-07 DIAGNOSIS — Z79899 Other long term (current) drug therapy: Secondary | ICD-10-CM | POA: Diagnosis not present

## 2015-12-08 ENCOUNTER — Observation Stay (HOSPITAL_COMMUNITY)
Admission: EM | Admit: 2015-12-08 | Discharge: 2015-12-10 | Disposition: A | Discharge status: Home / Self Care | Payer: Medicare Other | Attending: Student in an Organized Health Care Education/Training Program | Admitting: Student in an Organized Health Care Education/Training Program

## 2015-12-08 ENCOUNTER — Emergency Department (HOSPITAL_COMMUNITY): Payer: Medicare Other

## 2015-12-08 ENCOUNTER — Encounter (HOSPITAL_COMMUNITY): Payer: Self-pay | Admitting: Emergency Medicine

## 2015-12-08 DIAGNOSIS — I10 Essential (primary) hypertension: Secondary | ICD-10-CM | POA: Diagnosis not present

## 2015-12-08 DIAGNOSIS — N179 Acute kidney failure, unspecified: Secondary | ICD-10-CM

## 2015-12-08 DIAGNOSIS — Z79891 Long term (current) use of opiate analgesic: Secondary | ICD-10-CM | POA: Diagnosis not present

## 2015-12-08 DIAGNOSIS — M1288 Other specific arthropathies, not elsewhere classified, other specified site: Secondary | ICD-10-CM | POA: Diagnosis not present

## 2015-12-08 DIAGNOSIS — R079 Chest pain, unspecified: Secondary | ICD-10-CM | POA: Diagnosis not present

## 2015-12-08 DIAGNOSIS — Z79899 Other long term (current) drug therapy: Secondary | ICD-10-CM | POA: Diagnosis not present

## 2015-12-08 DIAGNOSIS — R42 Dizziness and giddiness: Secondary | ICD-10-CM | POA: Diagnosis not present

## 2015-12-08 DIAGNOSIS — M47817 Spondylosis without myelopathy or radiculopathy, lumbosacral region: Secondary | ICD-10-CM | POA: Diagnosis not present

## 2015-12-08 DIAGNOSIS — R001 Bradycardia, unspecified: Secondary | ICD-10-CM | POA: Diagnosis not present

## 2015-12-08 DIAGNOSIS — R410 Disorientation, unspecified: Secondary | ICD-10-CM | POA: Insufficient documentation

## 2015-12-08 DIAGNOSIS — F17210 Nicotine dependence, cigarettes, uncomplicated: Secondary | ICD-10-CM | POA: Diagnosis not present

## 2015-12-08 DIAGNOSIS — M069 Rheumatoid arthritis, unspecified: Secondary | ICD-10-CM | POA: Diagnosis present

## 2015-12-08 DIAGNOSIS — M5137 Other intervertebral disc degeneration, lumbosacral region: Secondary | ICD-10-CM | POA: Diagnosis not present

## 2015-12-08 DIAGNOSIS — N289 Disorder of kidney and ureter, unspecified: Secondary | ICD-10-CM | POA: Insufficient documentation

## 2015-12-08 DIAGNOSIS — R0602 Shortness of breath: Secondary | ICD-10-CM | POA: Diagnosis not present

## 2015-12-08 DIAGNOSIS — E785 Hyperlipidemia, unspecified: Secondary | ICD-10-CM | POA: Diagnosis present

## 2015-12-08 DIAGNOSIS — G894 Chronic pain syndrome: Secondary | ICD-10-CM | POA: Diagnosis not present

## 2015-12-08 HISTORY — DX: Anemia, unspecified: D64.9

## 2015-12-08 LAB — URINALYSIS, ROUTINE W REFLEX MICROSCOPIC
Bilirubin Urine: NEGATIVE
Glucose, UA: NEGATIVE mg/dL
Hgb urine dipstick: NEGATIVE
KETONES UR: NEGATIVE mg/dL
LEUKOCYTES UA: NEGATIVE
NITRITE: NEGATIVE
PH: 6 (ref 5.0–8.0)
Protein, ur: NEGATIVE mg/dL
SPECIFIC GRAVITY, URINE: 1.015 (ref 1.005–1.030)

## 2015-12-08 LAB — CBC
HCT: 42.1 % (ref 39.0–52.0)
Hemoglobin: 13.4 g/dL (ref 13.0–17.0)
MCH: 28.5 pg (ref 26.0–34.0)
MCHC: 31.8 g/dL (ref 30.0–36.0)
MCV: 89.4 fL (ref 78.0–100.0)
Platelets: 156 10*3/uL (ref 150–400)
RBC: 4.71 MIL/uL (ref 4.22–5.81)
RDW: 15.4 % (ref 11.5–15.5)
WBC: 5.8 10*3/uL (ref 4.0–10.5)

## 2015-12-08 LAB — HEPATIC FUNCTION PANEL
ALBUMIN: 3.4 g/dL — AB (ref 3.5–5.0)
ALT: 212 U/L — ABNORMAL HIGH (ref 17–63)
AST: 149 U/L — AB (ref 15–41)
Alkaline Phosphatase: 105 U/L (ref 38–126)
Bilirubin, Direct: <0.1 mg/dL — ABNORMAL LOW (ref 0.1–0.5)
TOTAL PROTEIN: 6.4 g/dL — AB (ref 6.5–8.1)
Total Bilirubin: 0.6 mg/dL (ref 0.3–1.2)

## 2015-12-08 LAB — BASIC METABOLIC PANEL
Anion gap: 6 (ref 5–15)
BUN: 11 mg/dL (ref 6–20)
CALCIUM: 9.5 mg/dL (ref 8.9–10.3)
CO2: 28 mmol/L (ref 22–32)
Chloride: 106 mmol/L (ref 101–111)
Creatinine, Ser: 1.43 mg/dL — ABNORMAL HIGH (ref 0.61–1.24)
GFR calc Af Amer: >60 mL/min (ref >60)
GFR, EST NON AFRICAN AMERICAN: 54 mL/min — AB (ref >60)
GLUCOSE: 122 mg/dL — AB (ref 65–99)
Potassium: 4.4 mmol/L (ref 3.5–5.1)
Sodium: 140 mmol/L (ref 135–145)

## 2015-12-08 LAB — RAPID URINE DRUG SCREEN, HOSP PERFORMED
Amphetamines: NOT DETECTED
BARBITURATES: NOT DETECTED
Benzodiazepines: POSITIVE — AB
COCAINE: NOT DETECTED
OPIATES: POSITIVE — AB
Tetrahydrocannabinol: NOT DETECTED

## 2015-12-08 LAB — I-STAT TROPONIN, ED: TROPONIN I, POC: 0 ng/mL (ref 0.00–0.08)

## 2015-12-08 LAB — TROPONIN I: Troponin I: <0.03 ng/mL (ref <0.031)

## 2015-12-08 MED ORDER — OXYCODONE HCL 5 MG PO TABS
5.0000 mg | ORAL_TABLET | ORAL | Status: DC | PRN
Start: 2015-12-08 — End: 2015-12-10
  Administered 2015-12-09 (×3): 5 mg via ORAL
  Filled 2015-12-08 (×3): qty 1

## 2015-12-08 MED ORDER — OXYCODONE-ACETAMINOPHEN 5-325 MG PO TABS
1.0000 | ORAL_TABLET | ORAL | Status: DC | PRN
  Administered 2015-12-09 – 2015-12-10 (×4): 1 via ORAL
  Filled 2015-12-08 (×4): qty 1

## 2015-12-08 MED ORDER — SODIUM CHLORIDE 0.9 % IV SOLN
INTRAVENOUS | Status: AC
  Administered 2015-12-09: via INTRAVENOUS

## 2015-12-08 MED ORDER — AMLODIPINE BESYLATE 5 MG PO TABS
5.0000 mg | ORAL_TABLET | Freq: Every day | ORAL | Status: DC
  Administered 2015-12-08 – 2015-12-10 (×3): 5 mg via ORAL
  Filled 2015-12-08 (×3): qty 1

## 2015-12-08 MED ORDER — SODIUM CHLORIDE 0.9% FLUSH
3.0000 mL | Freq: Two times a day (BID) | INTRAVENOUS | Status: DC
  Administered 2015-12-08 – 2015-12-10 (×3): 3 mL via INTRAVENOUS

## 2015-12-08 MED ORDER — HEPARIN SODIUM (PORCINE) 5000 UNIT/ML IJ SOLN
5000.0000 [IU] | Freq: Three times a day (TID) | INTRAMUSCULAR | Status: DC
  Administered 2015-12-09 – 2015-12-10 (×3): 5000 [IU] via SUBCUTANEOUS
  Filled 2015-12-08 (×3): qty 1

## 2015-12-08 MED ORDER — PANTOPRAZOLE SODIUM 40 MG PO TBEC
40.0000 mg | DELAYED_RELEASE_TABLET | Freq: Every day | ORAL | Status: DC
  Administered 2015-12-09 – 2015-12-10 (×2): 40 mg via ORAL
  Filled 2015-12-08 (×2): qty 1

## 2015-12-08 MED ORDER — ONDANSETRON HCL 4 MG PO TABS: 4.0000 mg | ORAL_TABLET | Freq: Three times a day (TID) | ORAL | Status: DC | PRN

## 2015-12-08 MED ORDER — ATORVASTATIN CALCIUM 40 MG PO TABS: 40.0000 mg | ORAL_TABLET | Freq: Every day | ORAL | Status: DC

## 2015-12-08 MED ORDER — LABETALOL HCL 5 MG/ML IV SOLN
5.0000 mg | Freq: Once | INTRAVENOUS | Status: AC
  Administered 2015-12-08: 5 mg via INTRAVENOUS
  Filled 2015-12-08: qty 4

## 2015-12-08 MED ORDER — SODIUM CHLORIDE 0.9 % IV BOLUS (SEPSIS)
500.0000 mL | Freq: Once | INTRAVENOUS | Status: AC
  Administered 2015-12-08: 500 mL via INTRAVENOUS

## 2015-12-08 MED ORDER — SODIUM CHLORIDE 0.9 % IV BOLUS (SEPSIS): 1000.0000 mL | Freq: Once | INTRAVENOUS | Status: DC

## 2015-12-08 MED ORDER — ASPIRIN EC 81 MG PO TBEC
81.0000 mg | DELAYED_RELEASE_TABLET | Freq: Every day | ORAL | Status: DC
  Administered 2015-12-09 – 2015-12-10 (×2): 81 mg via ORAL
  Filled 2015-12-08 (×2): qty 1

## 2015-12-08 MED ORDER — OXYCODONE-ACETAMINOPHEN 5-325 MG PO TABS
1.0000 | ORAL_TABLET | ORAL | Status: DC | PRN
  Administered 2015-12-08: 2 via ORAL
  Filled 2015-12-08: qty 2

## 2015-12-08 NOTE — ED Notes (Signed)
Attempted to call report

## 2015-12-08 NOTE — H&P (Signed)
Date: 12/08/2015               Patient Name:  [name] [name] MRN: [id number]  DOB: 31-Dec-1960 Age / Sex: 55 y.o., male   PCP: Collier Salina, MD         Medical Service: Internal Medicine Teaching Service         Attending Physician: Dr. Davonna Belling, MD    First Contact: Dr. Liberty Handy Pager: [phone]  Second Contact: Dr. Julious Oka Pager: [phone]       After Hours (After 5p/  First Contact Pager: [phone]  weekends / holidays): Second Contact Pager: [phone]   Chief Complaint: "I felt like an elephant was sitting on my chest."  History of Present Illness:  [name] [name] is a 55 year old man with rheumatoid arthritis recently started on infliximab 2 weeks ago, self-reported history of lupus with normal ANA titers in 2013, history of a 7.5x7.34mm mass on his aortic valve seen on a TTE in 2013 that was never further investigated, hypertension, and tobacco abuse here with chest pain.  Two weeks ago, he started infliximab infusions for his rheumatoid arthritis. Since that time, he has had nearly daily episodes of 10/10 substernal chest pressure with associated diaphoresis and nausea. These episodes are usually non-exertional, but he does have some stable angina that is not as bad. After these episodes, he feels "swimmy-headed, like [he's] drunk." He also says his blood pressure has been erratic for the last two weeks in the 200s/100s so he took two of his nabivolol pills this morning. Today, he had an episode that lasted about 30 minutes so he decided to come into the emergency room. He denies any radiation to his back, pleuritic chest pain, tenderness to palpation, pain worse after eating or lying flat, orthopnea, or paroxysmal nocturnal dyspnea; review of systems was otherwise non-revealing.  Meds: Current Facility-Administered Medications  Medication Dose Route Frequency Provider Last Rate Last Dose  . ondansetron (ZOFRAN) tablet 4 mg  4 mg Oral Q8H PRN Carly J Rivet, MD      .  oxyCODONE-acetaminophen (PERCOCET/ROXICET) 5-325 MG per tablet 1-2 tablet  1-2 tablet Oral Q4H PRN Carly J Rivet, MD      . sodium chloride 0.9 % bolus 1,000 mL  1,000 mL Intravenous Once Juliet Rude, MD       Current Outpatient Prescriptions  Medication Sig Dispense Refill  . baclofen (LIORESAL) 10 MG tablet Take 10 mg by mouth 2 (two) times daily as needed for muscle spasms.   2  . BYSTOLIC 5 MG tablet TAKE 1 TABLET BY MOUTH DAILY. 90 tablet 1  . cetirizine (ZYRTEC) 10 MG tablet Take 10 mg by mouth daily as needed (takes with infusions).    . ferrous sulfate 325 (65 FE) MG tablet Take 325 mg by mouth daily with breakfast.    . INFLIXIMAB IV Inject into the vein.    [name] ondansetron (ZOFRAN) 4 MG tablet Take 1 tablet (4 mg total) by mouth every 8 (eight) hours as needed for nausea or vomiting. 40 tablet 0  . oxyCODONE-acetaminophen (PERCOCET) 10-325 MG tablet Take 1 tablet by mouth daily as needed.    . pantoprazole (PROTONIX) 40 MG tablet TAKE 1 TABLET (40 MG TOTAL) BY MOUTH 2 (TWO) TIMES DAILY. 60 tablet 2  . oxyCODONE-acetaminophen (ROXICET) 5-325 MG per tablet Take 1-2 tablets by mouth every 4 (four) hours as needed. 60 tablet 0  . zolpidem (AMBIEN CR) 6.25 MG CR tablet TAKE 1  TABLET BY MOUTH AT BEDTIME AS NEEDED FOR SLEEP 30 tablet 0    Allergies: Allergies as of 12/08/2015 - Review Complete 12/08/2015  Allergen Reaction Noted  . Ramipril [ramipril] Rash 06/23/2011  . Tramadol Itching and Rash 06/23/2011   Past Medical History  Diagnosis Date  . Lupus ([place])     ied forward by the bloody EMR.   . History of anemia     no current med.  [name] GERD (gastroesophageal reflux disease)   . Hypertension     states is borderline on medication; has been on med. x 5-6 yr.  . Heart murmur     states no problems, no cardiologist  . 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 Impingement syndrome of shoulder region 10/2014    left  . Strain of tendon of left  rotator cuff 10/2014  . RA (rheumatoid arthritis) ([place])   . Osteoarthritis     left shoulder  . 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 Abrasion of left thumb 11/16/2014   Past Surgical History  Procedure Laterality Date  . Video assisted thoracoscopy (vats)/decortication Right 11/21/2011    drainage of empyema  . Achilles tendon surgery Bilateral   . Osteotomy and ulnar shortening Right 07/22/2002  . Shoulder arthroscopy with rotator cuff repair and subacromial decompression Left 11/19/2014    Procedure: LEFT SHOULDER ARTHROSCOPY, DEBRIDEMENT DISTAL CLAVICLE EXCISION, ACROMIOPLASTY WITH ROTATOR CUFF REPAIR ;  Surgeon: Kathryne Hitch, MD;  Location: [facility];  Service: Orthopedics;  Laterality: Left;  . Shoulder arthroscopy with distal clavicle resection Left 11/19/2014    Procedure: SHOULDER ARTHROSCOPY WITH DISTAL CLAVICLE RESECTION;  Surgeon: Kathryne Hitch, MD;  Location: [facility];  Service: Orthopedics;  Laterality: Left;  . Shoulder arthroscopy with bicepstenotomy Right 03/11/2015    Procedure: SHOULDER ARTHROSCOPY WITH BICEPSTENOTOMY;  Surgeon: Ninetta Lights, MD;  Location: [facility];  Service: Orthopedics;  Laterality: Right;   Family History  Problem Relation Age of Onset  . Stroke Neg Hx   . Cancer Neg Hx   . Heart disease Father 31   Social History   Social History  . Marital Status: Single    Spouse Name: N/A  . Number of Children: N/A  . Years of Education: N/A   Occupational History  . Not on file.   Social History Main Topics  . Smoking status: Current Every Day Smoker -- 0.75 packs/day for 20 years    Types: Cigars, Cigarettes  . Smokeless tobacco: Never Used     Comment: 2-3 cig./day  . Alcohol Use: No  . Drug Use: No  . Sexual Activity: Not on file   Other Topics Concern  . Not on file   Social History Narrative   Patient lives by himself, is on disability since 1992, used to work with city [employer] [place]. Educated till 10th  grade. Smokes 1/2 PPD and has been smoking since 25 years. Drinks only occasionally.         Review of Systems: Per HPI  Physical Exam: Blood pressure 169/103, pulse 64, temperature 97.7 F (36.5 C), temperature source Oral, resp. rate 16, SpO2 100 %. General: friendly man resting in bed comfortably, appropriately conversational HEENT: no scleral icterus, extra-ocular muscles intact, oropharynx without lesions Cardiac: regular rate and rhythm, no rubs, murmurs or gallops Pulm: breathing well, subtle bibasilar crackles Abd: bowel sounds normal, soft, nondistended, non-tender Ext: warm and well perfused, without pedal edema MSK: obvious joint deformities  at bilateral MCPs Lymph: no cervical or supraclavicular lymphadenopathy Skin: no rash, hair, or nail changes Neuro: alert and oriented X3, cranial nerves II-XII grossly intact, moving all extremities well  Lab results: Basic Metabolic Panel:  Recent Labs  12/08/15 1551  NA 140  K 4.4  CL 106  CO2 28  GLUCOSE 122*  BUN 11  CREATININE 1.43*  CALCIUM 9.5   CBC:  Recent Labs  12/08/15 1551  WBC 5.8  HGB 13.4  HCT 42.1  MCV 89.4  PLT 156   Imaging results:  Dg Chest 2 View  12/08/2015  CLINICAL DATA:  Chest pain and shortness of breath today, smoker, history lupus, hypertension EXAM: CHEST  2 VIEW COMPARISON:  09/25/2012 FINDINGS: Enlargement of cardiac silhouette. Calcified and tortuous thoracic aorta. Minimal atelectasis at base of lingula. Minimal chronic peribronchial thickening. No pulmonary infiltrate, pleural effusion, or pneumothorax. Bones demineralized. IMPRESSION: Enlargement of cardiac silhouette with minimal lingular atelectasis. Electronically Signed   By: Lavonia Dana M.D.   On: 12/08/2015 16:40   Ct Head Wo Contrast  12/08/2015  CLINICAL DATA:  Dizziness with altered gait today. History of hypertension. Initial encounter. EXAM: CT HEAD WITHOUT CONTRAST TECHNIQUE: Contiguous axial images were obtained from  the base of the skull through the vertex without intravenous contrast. COMPARISON:  Head CT 04/09/2000 FINDINGS: Brain: There is no evidence of acute intracranial hemorrhage, mass lesion, brain edema or extra-axial fluid collection. The ventricles and subarachnoid spaces are appropriately sized for age. There is no CT evidence of acute cortical infarction. Bones/sinuses/visualized face: There is a stable small mucous retention cyst or polyp in the right maxillary sinus. The visualized paranasal sinuses, mastoid air cells and middle ears are otherwise clear. The calvarium is intact. IMPRESSION: Stable head CT.  No acute intracranial findings. Electronically Signed   By: Richardean Sale M.D.   On: 12/08/2015 19:12    Other results: EKG: normal sinus rhythm, deepened Q waves and T-wave inversions in III and aVF compared to an EKG from May 2016, no other ischemic changes  Assessment & Plan by Problem:  Mr. Tesfay is a 55 year old man with rheumatoid arthritis recently started on infliximab 2 weeks ago, self-reported history of lupus with normal ANA titers in 2013, history of a 7.5x7.35mm mass on his aortic valve seen on a TTE that was never further investigated, hypertension, and tobacco abuse here with chest pain.  Typical angina: He tells a great story for acute coronary syndrome with crushing substernal chest pressure, nausea, and diaphoresis, and his EKG shows deepened Q-waves and flipped T-waves in the inferior leads. He also has a mysterious aortic mass seen on a TTE in 2013 that was never followed up, concerning for Libbman-Sach's endocarditis given his self-reported history of lupus. I think the latter is less likely to explain his chest pain but certainly demands further investigation nonetheless. His episode of chest pain happened at 10am this morning but has since resolved; his point-of-care troponin was reassuringly normal 6.5 hours later but we will continue to trend troponins overnight. Because our  suspicion for coronary artery disease is so high, I think we should consider doing an in-house treadmill stress test tomorrow. He is convinced that his symptoms are related to his infliximab; there are case reports of infliximab precipitating thromboses so if he does indeed have an acute thrombus we may want to consider recommending changing his DMARD therapy to his Rheumatologist. Given the history of aortic mass, we will repeat another TTE this admission as  well. -Started aspirin 81mg  daily -Started atorvastatin 40mg  daily -Trending troponins overnight -Ordered transthoracic echocardiogram -Checking TSH -EKG in the morning -Monitor on telemetry overnight -Consider in-house treadmill stress test tomorrow -Consider recommending stopping infliximab pending above work-up   Hypertension: He is markedly hypertensive today at 200/110 so we will give him labetalol once and go ahead and start amlodipine for now. -Gave labetalol 5mg  IV once -Started amlodipine 10mg  daily  Acute renal insufficiency: His creatinine is elevated to 1.4 above his baseline of less than 1 from last year. He appears euvolemic on exam and his BUN is normal; I wonder if this is progression of chronic kidney disease from his poorly-controlled hypertension as opposed to pre-renal azotemia. He got a 500cc bolus in the emergency room so we will see what his renal function does overnight. -BMP in the morning  Rheumatoid arthritis: He has been on leflunomaide and methotrexate in the past but its unclear why he was changed to infliximab two weeks ago. Infliximab could be contributing to his coronary artery disease per above.  Chronic back pain: We will resume his home opiate regimen. -Continue Percocet 10-325 every 4 hours as needed for pain  Dispo: Disposition is deferred at this time, awaiting improvement of current medical problems. Anticipated discharge in approximately 1-3 day(s).   The patient does have a current PCP  Collier Salina, MD) and does need an [facility] hospital follow-up appointment after discharge.  The patient does not know have transportation limitations that hinder transportation to clinic appointments.  Signed: Loleta Chance, MD 12/08/2015, 8:42 PM

## 2015-12-08 NOTE — ED Notes (Signed)
Pt reports feeling hot around neck while at pain management this morning.  States he got home and felt disoriented since around 10am. States he was disorientated because he was taking a nap this morning and woke up thinking it was Friday.  Pt is alert and oriented at this time with no neuro deficits noted on triage exam.  Reports pressure across chest (L side worse than R), sob, dizziness, nausea, and vomited (6-7 times).

## 2015-12-08 NOTE — ED Provider Notes (Signed)
CSN: [id number]     Arrival date & time 12/08/15  1449 History   First MD Initiated Contact with Patient 12/08/15 1705     Chief Complaint  Patient presents with  . Chest Pain  . Shortness of Breath  . Emesis      Patient is a 55 y.o. male presenting with chest pain, shortness of breath, and vomiting. The history is provided by the patient.  Chest Pain Associated symptoms: nausea, shortness of breath and vomiting   Associated symptoms: no back pain and no fever   Shortness of Breath Associated symptoms: chest pain and vomiting   Associated symptoms: no fever   Emesis Patient had and episode of confusion. Was asked pain management and began to feel bad. States that he had slurred speech and was having difficulty speaking. Also had confusion was confused about the day. He had some nausea and vomiting before the episode also. He had a severe headache yesterday. No localizing numbness or weakness. States he felt as if he was drunk and was having difficulty walking.Patient states he is feeling better now but still slightly confused.  Past Medical History  Diagnosis Date  . Lupus ([place])     ied forward by the bloody EMR.   . History of anemia     no current med.  [name] GERD (gastroesophageal reflux disease)   . Hypertension     states is borderline on medication; has been on med. x 5-6 yr.  . Heart murmur     states no problems, no cardiologist  . 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 Impingement syndrome of shoulder region 10/2014    left  . Strain of tendon of left rotator cuff 10/2014  . RA (rheumatoid arthritis) ([place])   . Osteoarthritis     left shoulder  . 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 Abrasion of left thumb 11/16/2014   Past Surgical History  Procedure Laterality Date  . Video assisted thoracoscopy (vats)/decortication Right 11/21/2011    drainage of empyema  . Achilles tendon surgery Bilateral   . Osteotomy and ulnar shortening Right 07/22/2002  .  Shoulder arthroscopy with rotator cuff repair and subacromial decompression Left 11/19/2014    Procedure: LEFT SHOULDER ARTHROSCOPY, DEBRIDEMENT DISTAL CLAVICLE EXCISION, ACROMIOPLASTY WITH ROTATOR CUFF REPAIR ;  Surgeon: Kathryne Hitch, MD;  Location: [facility];  Service: Orthopedics;  Laterality: Left;  . Shoulder arthroscopy with distal clavicle resection Left 11/19/2014    Procedure: SHOULDER ARTHROSCOPY WITH DISTAL CLAVICLE RESECTION;  Surgeon: Kathryne Hitch, MD;  Location: [facility];  Service: Orthopedics;  Laterality: Left;  . Shoulder arthroscopy with bicepstenotomy Right 03/11/2015    Procedure: SHOULDER ARTHROSCOPY WITH BICEPSTENOTOMY;  Surgeon: Ninetta Lights, MD;  Location: [facility];  Service: Orthopedics;  Laterality: Right;   Family History  Problem Relation Age of Onset  . Stroke Neg Hx   . Cancer Neg Hx   . Heart disease Father 61   Social History  Substance Use Topics  . Smoking status: Current Every Day Smoker -- 0.75 packs/day for 20 years    Types: Cigars, Cigarettes  . Smokeless tobacco: Never Used     Comment: 2-3 cig./day  . Alcohol Use: No    Review of Systems  Constitutional: Negative for fever and appetite change.  HENT: Negative for ear discharge.   Respiratory: Positive for shortness of breath.   Cardiovascular: Positive for chest pain.  Gastrointestinal: Positive for nausea  and vomiting.  Genitourinary: Negative for hematuria.  Musculoskeletal: Negative for back pain.  Skin: Negative for wound.  Neurological: Positive for light-headedness.  Psychiatric/Behavioral: Positive for confusion.      Allergies  Ramipril and Tramadol  Home Medications   Prior to Admission medications   Medication Sig Start Date End Date Taking? Authorizing Provider  baclofen (LIORESAL) 10 MG tablet Take 10 mg by mouth 2 (two) times daily as needed for muscle spasms.  11/11/15  Yes Historical Provider, MD  BYSTOLIC 5 MG  tablet TAKE 1 TABLET BY MOUTH DAILY. 08/25/15  Yes Collier Salina, MD  cetirizine (ZYRTEC) 10 MG tablet Take 10 mg by mouth daily as needed (takes with infusions).   Yes Historical Provider, MD  ferrous sulfate 325 (65 FE) MG tablet Take 325 mg by mouth daily with breakfast.   Yes Historical Provider, MD  INFLIXIMAB IV Inject into the vein.   Yes Historical Provider, MD  ondansetron (ZOFRAN) 4 MG tablet Take 1 tablet (4 mg total) by mouth every 8 (eight) hours as needed for nausea or vomiting. 11/19/14  Yes Aundra Dubin, PA-C  oxyCODONE-acetaminophen (PERCOCET) 10-325 MG tablet Take 1 tablet by mouth daily as needed. 12/08/15  Yes Historical Provider, MD  pantoprazole (PROTONIX) 40 MG tablet TAKE 1 TABLET (40 MG TOTAL) BY MOUTH 2 (TWO) TIMES DAILY. 01/07/15  Yes Bartholomew Crews, MD  oxyCODONE-acetaminophen (ROXICET) 5-325 MG per tablet Take 1-2 tablets by mouth every 4 (four) hours as needed. 03/11/15   Aundra Dubin, PA-C  zolpidem (AMBIEN CR) 6.25 MG CR tablet TAKE 1 TABLET BY MOUTH AT BEDTIME AS NEEDED FOR SLEEP 10/06/15   Collier Salina, MD   BP 169/103 mmHg  Pulse 64  Temp(Src) 97.7 F (36.5 C) (Oral)  Resp 16  SpO2 100% Physical Exam  Constitutional: He is oriented to person, place, and time. He appears well-developed.  HENT:  Head: Atraumatic.  Neck: Neck supple.  Cardiovascular:  Mild bradycardia.  Pulmonary/Chest: Effort normal.  Abdominal: Soft. There is no tenderness.  Musculoskeletal: Normal range of motion.  Neurological: He is alert and oriented to person, place, and time.  Finger-nose intact.  Skin: Skin is warm.    ED Course  Procedures (including critical care time) Labs Review Labs Reviewed  BASIC METABOLIC PANEL - Abnormal; Notable for the following:    Glucose, Bld 122 (*)    Creatinine, Ser 1.43 (*)    GFR calc non Af Amer 54 (*)    All other components within normal limits  CBC  TROPONIN I  TROPONIN I  TROPONIN I  URINE RAPID DRUG SCREEN,  HOSP PERFORMED  HEMOGLOBIN A1C  I-STAT TROPOININ, ED    Imaging Review Dg Chest 2 View  12/08/2015  CLINICAL DATA:  Chest pain and shortness of breath today, smoker, history lupus, hypertension EXAM: CHEST  2 VIEW COMPARISON:  09/25/2012 FINDINGS: Enlargement of cardiac silhouette. Calcified and tortuous thoracic aorta. Minimal atelectasis at base of lingula. Minimal chronic peribronchial thickening. No pulmonary infiltrate, pleural effusion, or pneumothorax. Bones demineralized. IMPRESSION: Enlargement of cardiac silhouette with minimal lingular atelectasis. Electronically Signed   By: Lavonia Dana M.D.   On: 12/08/2015 16:40   Ct Head Wo Contrast  12/08/2015  CLINICAL DATA:  Dizziness with altered gait today. History of hypertension. Initial encounter. EXAM: CT HEAD WITHOUT CONTRAST TECHNIQUE: Contiguous axial images were obtained from the base of the skull through the vertex without intravenous contrast. COMPARISON:  Head CT 04/09/2000 FINDINGS: Brain: There is  no evidence of acute intracranial hemorrhage, mass lesion, brain edema or extra-axial fluid collection. The ventricles and subarachnoid spaces are appropriately sized for age. There is no CT evidence of acute cortical infarction. Bones/sinuses/visualized face: There is a stable small mucous retention cyst or polyp in the right maxillary sinus. The visualized paranasal sinuses, mastoid air cells and middle ears are otherwise clear. The calvarium is intact. IMPRESSION: Stable head CT.  No acute intracranial findings. Electronically Signed   By: Richardean Sale M.D.   On: 12/08/2015 19:12   I have personally reviewed and evaluated these images and lab results as part of my medical decision-making.   EKG Interpretation   Date/Time:  Wednesday December 08 2015 15:38:34 EDT Ventricular Rate:  58 PR Interval:  146 QRS Duration: 96 QT Interval:  388 QTC Calculation: 380 R Axis:   61 Text Interpretation:  Sinus bradycardia Minimal voltage  criteria for LVH,  may be normal variant Possible Inferior infarct , age undetermined Cannot  rule out Anterior infarct , age undetermined Abnormal ECG Confirmed by  Alvino Chapel  MD, Wilmot Quevedo [phone]) on 12/08/2015 5:32:05 PM      MDM   Final diagnoses:  Renal insufficiency  Sinus bradycardia  Patient with episode of confusion unsteadiness and vomiting. Mild bradycardia. May have been vagal from the vomiting but lasted a little longer. Also has some bradycardia with rates of 50s. Creatinine mildly increased. Will admit to internal medicine.  Davonna Belling, MD 12/08/15 2031

## 2015-12-09 ENCOUNTER — Observation Stay (HOSPITAL_BASED_OUTPATIENT_CLINIC_OR_DEPARTMENT_OTHER): Payer: Medicare Other

## 2015-12-09 ENCOUNTER — Observation Stay (HOSPITAL_COMMUNITY): Payer: Medicare Other

## 2015-12-09 DIAGNOSIS — E785 Hyperlipidemia, unspecified: Secondary | ICD-10-CM

## 2015-12-09 DIAGNOSIS — I359 Nonrheumatic aortic valve disorder, unspecified: Secondary | ICD-10-CM

## 2015-12-09 DIAGNOSIS — G8929 Other chronic pain: Secondary | ICD-10-CM

## 2015-12-09 DIAGNOSIS — I119 Hypertensive heart disease without heart failure: Secondary | ICD-10-CM

## 2015-12-09 DIAGNOSIS — I351 Nonrheumatic aortic (valve) insufficiency: Secondary | ICD-10-CM

## 2015-12-09 DIAGNOSIS — Z79891 Long term (current) use of opiate analgesic: Secondary | ICD-10-CM

## 2015-12-09 DIAGNOSIS — I10 Essential (primary) hypertension: Secondary | ICD-10-CM

## 2015-12-09 DIAGNOSIS — N179 Acute kidney failure, unspecified: Secondary | ICD-10-CM | POA: Diagnosis not present

## 2015-12-09 DIAGNOSIS — R001 Bradycardia, unspecified: Secondary | ICD-10-CM

## 2015-12-09 DIAGNOSIS — M05711 Rheumatoid arthritis with rheumatoid factor of right shoulder without organ or systems involvement: Secondary | ICD-10-CM

## 2015-12-09 DIAGNOSIS — R079 Chest pain, unspecified: Secondary | ICD-10-CM | POA: Diagnosis not present

## 2015-12-09 DIAGNOSIS — M329 Systemic lupus erythematosus, unspecified: Secondary | ICD-10-CM

## 2015-12-09 DIAGNOSIS — M549 Dorsalgia, unspecified: Secondary | ICD-10-CM

## 2015-12-09 DIAGNOSIS — R740 Nonspecific elevation of levels of transaminase and lactic acid dehydrogenase [LDH]: Secondary | ICD-10-CM | POA: Diagnosis not present

## 2015-12-09 DIAGNOSIS — I209 Angina pectoris, unspecified: Secondary | ICD-10-CM | POA: Diagnosis not present

## 2015-12-09 DIAGNOSIS — M069 Rheumatoid arthritis, unspecified: Secondary | ICD-10-CM

## 2015-12-09 DIAGNOSIS — R938 Abnormal findings on diagnostic imaging of other specified body structures: Secondary | ICD-10-CM

## 2015-12-09 DIAGNOSIS — R072 Precordial pain: Secondary | ICD-10-CM | POA: Diagnosis not present

## 2015-12-09 LAB — ECHOCARDIOGRAM COMPLETE
E decel time: 285 ms
E/e' ratio: 8.48
FS: 37 % (ref 28–44)
Height: 68 in
IVS/LV PW RATIO, ED: 1.03
LA ID, A-P, ES: 40 mm
LA diam end sys: 40 mm
LA diam index: 2.03 cm/m2
LA vol A4C: 37.5 mL
LA vol index: 21.6 mL/m2
LA vol: 42.6 mL
LV E/e' medial: 8.48
LV E/e'average: 8.48
LV PW d: 14.9 mm — AB (ref 0.6–1.1)
LV e' LATERAL: 6.64 cm/s
MV Dec: 285
MV pk A vel: 68.9 m/s
MV pk E vel: 56.3 m/s
Reg peak vel: 252 cm/s
TAPSE: 20.4 mm
TDI e' lateral: 6.64
TDI e' medial: 5.87
TR max vel: 252 cm/s
Weight: 2921.6 [oz_av]

## 2015-12-09 LAB — BASIC METABOLIC PANEL WITH GFR
Anion gap: 7 (ref 5–15)
BUN: 9 mg/dL (ref 6–20)
CO2: 30 mmol/L (ref 22–32)
Calcium: 9 mg/dL (ref 8.9–10.3)
Chloride: 104 mmol/L (ref 101–111)
Creatinine, Ser: 0.92 mg/dL (ref 0.61–1.24)
GFR calc Af Amer: >60 mL/min
GFR calc non Af Amer: >60 mL/min
Glucose, Bld: 93 mg/dL (ref 65–99)
Potassium: 4.4 mmol/L (ref 3.5–5.1)
Sodium: 141 mmol/L (ref 135–145)

## 2015-12-09 LAB — TROPONIN I
Troponin I: <0.03 ng/mL (ref <0.031)
Troponin I: <0.03 ng/mL (ref <0.031)

## 2015-12-09 LAB — TSH: TSH: 0.73 u[IU]/mL (ref 0.350–4.500)

## 2015-12-09 MED ORDER — IOPAMIDOL (ISOVUE-370) INJECTION 76%
INTRAVENOUS | Status: AC
  Administered 2015-12-09: 100 mL
  Filled 2015-12-09: qty 100

## 2015-12-09 MED ORDER — METOPROLOL TARTRATE 50 MG PO TABS
50.0000 mg | ORAL_TABLET | Freq: Once | ORAL | Status: AC
  Administered 2015-12-09: 50 mg via ORAL
  Filled 2015-12-09: qty 1

## 2015-12-09 MED ORDER — NITROGLYCERIN 0.4 MG/SPRAY TL SOLN
1.0000 | Status: DC | PRN
  Filled 2015-12-09: qty 4.9

## 2015-12-09 MED ORDER — NITROGLYCERIN 0.4 MG SL SUBL
SUBLINGUAL_TABLET | SUBLINGUAL | Status: AC
  Administered 2015-12-09: 0.4 mg via ORAL
  Filled 2015-12-09: qty 2

## 2015-12-09 NOTE — Progress Notes (Signed)
Subjective: No acute events overnight. Denies dizziness, chest pain, nausea or abdominal pain  Objective: Vital signs in last 24 hours: Filed Vitals:   12/09/15 0555 12/09/15 0615 12/09/15 0630 12/09/15 0700  BP: 151/99 136/95 151/79 136/83  Pulse: 66 62 64 63  Temp: 97.9 F (36.6 C)     TempSrc: Oral     Resp: 13 12 12 12   Height:      Weight: 82.827 kg (182 lb 9.6 oz)     SpO2: 96% 99% 98% 95%   Intake/Output Summary (Last 24 hours) at 12/09/15 0854 Last data filed at 12/09/15 0647  Gross per 24 hour  Intake   1410 ml  Output   1350 ml  Net     60 ml    Physical Exam General: no acute distress, cooperative, comfortably laying on bed Cardiovascular: regular rate and rhythm  Pulmonary: clear to auscultation bilaterally  Abdominal: normal active bowel sounds, nontender Extermities: no peripheral edema MSK: ulnar deviation appreciated in hands bilaterally  Lab Results: Basic Metabolic Panel:  Recent Labs  12/08/15 1551 12/09/15 0732  NA 140 141  K 4.4 4.4  CL 106 104  CO2 28 30  GLUCOSE 122* 93  BUN 11 9  CREATININE 1.43* 0.92  CALCIUM 9.5 9.0   Liver Function Tests:  Recent Labs  12/08/15 2045  AST 149*  ALT 212*  ALKPHOS 105  BILITOT 0.6  PROT 6.4*  ALBUMIN 3.4*   No results for input(s): LIPASE, AMYLASE in the last 72 hours. No results for input(s): AMMONIA in the last 72 hours. CBC:  Recent Labs  12/08/15 1551  WBC 5.8  HGB 13.4  HCT 42.1  MCV 89.4  PLT 156   Cardiac Enzymes:  Recent Labs  12/08/15 2045 12/09/15 0230 12/09/15 0732  TROPONINI <0.03 <0.03 <0.03   Thyroid Function Tests:  Recent Labs  12/09/15 0230  TSH 0.730    Urine Drug Screen: Drugs of Abuse     Component Value Date/Time   LABOPIA POSITIVE* 12/08/2015 2049   LABOPIA NEG 08/30/2010 1653   COCAINSCRNUR NONE DETECTED 12/08/2015 2049   COCAINSCRNUR NEG 08/30/2010 1653   LABBENZ POSITIVE* 12/08/2015 2049   LABBENZ POS* 08/30/2010 1653   AMPHETMU NONE  DETECTED 12/08/2015 2049   AMPHETMU NEG 08/30/2010 1653   THCU NONE DETECTED 12/08/2015 2049   LABBARB NONE DETECTED 12/08/2015 2049    Urinalysis:  Recent Labs  12/08/15 2049  COLORURINE YELLOW  LABSPEC 1.015  PHURINE 6.0  GLUCOSEU NEGATIVE  HGBUR NEGATIVE  BILIRUBINUR NEGATIVE  KETONESUR NEGATIVE  PROTEINUR NEGATIVE  NITRITE NEGATIVE  LEUKOCYTESUR NEGATIVE   Studies/Results: Dg Chest 2 View  12/08/2015  CLINICAL DATA:  Chest pain and shortness of breath today, smoker, history lupus, hypertension EXAM: CHEST  2 VIEW COMPARISON:  09/25/2012 FINDINGS: Enlargement of cardiac silhouette. Calcified and tortuous thoracic aorta. Minimal atelectasis at base of lingula. Minimal chronic peribronchial thickening. No pulmonary infiltrate, pleural effusion, or pneumothorax. Bones demineralized. IMPRESSION: Enlargement of cardiac silhouette with minimal lingular atelectasis. Electronically Signed   By: Lavonia Dana M.D.   On: 12/08/2015 16:40   Ct Head Wo Contrast  12/08/2015  CLINICAL DATA:  Dizziness with altered gait today. History of hypertension. Initial encounter. EXAM: CT HEAD WITHOUT CONTRAST TECHNIQUE: Contiguous axial images were obtained from the base of the skull through the vertex without intravenous contrast. COMPARISON:  Head CT 04/09/2000 FINDINGS: Brain: There is no evidence of acute intracranial hemorrhage, mass lesion, brain edema or extra-axial fluid collection. The  ventricles and subarachnoid spaces are appropriately sized for age. There is no CT evidence of acute cortical infarction. Bones/sinuses/visualized face: There is a stable small mucous retention cyst or polyp in the right maxillary sinus. The visualized paranasal sinuses, mastoid air cells and middle ears are otherwise clear. The calvarium is intact. IMPRESSION: Stable head CT.  No acute intracranial findings. Electronically Signed   By: Richardean Sale M.D.   On: 12/08/2015 19:12   Medications:  Scheduled Meds: .  amLODipine  5 mg Oral Daily  . aspirin EC  81 mg Oral Daily  . heparin  5,000 Units Subcutaneous Q8H  . pantoprazole  40 mg Oral Daily  . sodium chloride flush  3 mL Intravenous Q12H   Continuous Infusions: . sodium chloride 100 mL/hr at 12/09/15 0005   PRN Meds:.ondansetron, oxyCODONE **AND** oxyCODONE-acetaminophen  Problem List Active Problems:   Lightheadedness   Assessment/Plan: [name] [name] is a 55 year old man with a history of RA, SLE, HTN, and 7.5x7.65mm mass on aortic valve seen on TTE who presented after sudden onset of dizziness, diaphoresis,  nausea, and substernal chest pressure  Chest pain:  No complaints of chest pain today.  Episode of chest pain that is relieved with rest with associated nausea and diaphoresis meet criteria for typical angina.   ECG showed no ST elevations but new T wave inversions in inferior leads. Troponinx3 negative.  ECG and negative troponins consistent with unstable angina.  Patient is at high risk of coronary arey disease for coronary artery disease with history of HLD, HTN and smoking history.  Further workup is recommended. Patient recently began receiving infliximab infusions which has been associated with post transfusion chest pain and thrombosis.  Calcified mss (7.5x7.68mm) on aortic valve seen on TTE in 2003 was never followed up and could be a contributing factor to presenting symptoms.  History of lupus concerning for Libbman-Sach's endocarditis.  Normal TSH rules out hypothyroidism.  -- TEE today -- cardiology consult to determine need for inpatient vs. outpatient stress test and further workup -- hold infliximab -- continue aspirin 81mg   Acute Kidney Injury--Resolved:  Creatinine down to 0.92 today (1.43 on admission) after IVF.  Baseline Cr 0.9.  UA negative for infection but showed mild dehydration as specific gravity was 0.015.  Hypertension--improving:  Giving a dose of labetalol 5mg  IV and started on amlodipine 10mg  daily after  hypertensive to the 200s on admission.  Blood pressures overnight were better controlled.  (Most recent was 136/83). Uncontrolled with home Bystolic and held yesterday. Beta-blockers are not first line for hypertension and are usually an add on in patients with HF and CAD.   -- continue Norvasc 5mg  -- continue to hold bystolic 5mg  for now  Rheumatoid Arthritis:  Recently started on  IV infliximab with last dose on 12/01/2015.  Previously on etanercept and leflunomide.  Continue to hold infliximab as it may have contributed to his presentation.  -- hold Infliximab  Transaminitis.  AST: 149, ALT: 212. Normal at baseline.  Last LFT in 2013. Infliximab is hepatotoxic and recent start of this medication could have contributed to his change in liver function. -- hold Infliximab  History of pulmonary nodule:  Pulmonary nodules (largest 80mm) found in upper left lobe in 2013 chest CT.  Patient is at high risk for malignancy and should have a followup CT in the outpatient setting to further characterize nodules.   Chronic back pain Pain: home pain medication resumed -  Percocet 10-325mg q 4hr PRN  Dispo: pending  clinical improvement   This is a Careers information officer Note.  The care of the patient was discussed with Dr. Hulen Luster and the assessment and plan formulated with their assistance.  Please see their attached note for official documentation of the daily encounter.     Garald Braver, Med Student 12/09/2015, 8:54 AM

## 2015-12-09 NOTE — Consult Note (Addendum)
CARDIOLOGY CONSULT NOTE   Patient ID: Tralyn Benitez MRN: [id number] DOB/AGE: [birth year] 55 y.o.  Admit date: 12/08/2015  Requesting Physician: Dr. Daryll Drown  Primary Physician:   Hinton Lovely, MD Primary Cardiologist:   New Reason for Consultation:   Chest pain and mass on aortic valve  HPI: Jamahri Fringer is a 55 y.o. 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   In 10/2011 he was admitted for pneumonia and treated with IV antibiotics. He then developed progressive right pleuritic chest pain and found to have a empyema. He was seen by Dr. Nils Pyle who performed a VATS/decortication and draining of empyema. 2-D echo at that time showed normal EF, no wall motion abnormalities with a 7.57.5 mm calcified mass noted in the region of the right coronary cusp. 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 TEE was recommended by reading cardiologist. However, I do not see any comment about aortic valve mass in progress notes or discharge summary.  Two weeks ago, he was started infliximab infusions for his rheumatoid arthritis. He has had 2-3 infusions. Since that time, he has had nearly daily episodes of 10/10 substernal chest pressure with associated diaphoresis and nausea. He also feels dizzy and SOB. These seem to be worse after the infusions and get better as he gets further from the infusion day. The chest pain was noted with both exertion and while laying down. Currently chest pain free. He has also noted a progressive worsening of his BP since starting the infusions. No LE edema, orthopnea or PND.   His father was a heavy smoker/alcoholic who died in his [id number] from (he thinks) a heart attack. No other family hx of  CAD.  Repeat 2D ECHO today showed normal LV function, G1DD, mild-mod AR, 1.0 x 0.87 cm mass (slightly larger than previous).   Past Medical History  Diagnosis Date  . Lupus ([place])   . History of anemia     no current med.  [name] GERD (gastroesophageal reflux disease)   . Hypertension     states is borderline on medication; has been on med. x 5-6 yr.  . Impingement syndrome of shoulder region 10/2014    left  . RA (rheumatoid arthritis) ([place])   . Heart murmur   . Pneumonia   . Anemia      Past Surgical History  Procedure Laterality Date  . Video assisted thoracoscopy (vats)/decortication Right 11/21/2011    drainage of empyema  . Achilles tendon surgery Bilateral   . Osteotomy and ulnar shortening Right 07/22/2002  . Shoulder arthroscopy with rotator cuff repair and subacromial decompression Left 11/19/2014    Procedure: LEFT SHOULDER ARTHROSCOPY, DEBRIDEMENT DISTAL CLAVICLE EXCISION, ACROMIOPLASTY WITH ROTATOR CUFF REPAIR ;  Surgeon: Kathryne Hitch, MD;  Location: [facility];  Service: Orthopedics;  Laterality: Left;  . Shoulder arthroscopy with distal clavicle resection Left 11/19/2014    Procedure: SHOULDER ARTHROSCOPY WITH DISTAL CLAVICLE RESECTION;  Surgeon: Kathryne Hitch, MD;  Location: [facility];  Service: Orthopedics;  Laterality: Left;  . Shoulder arthroscopy with bicepstenotomy Right 03/11/2015    Procedure: SHOULDER ARTHROSCOPY WITH BICEPSTENOTOMY;  Surgeon: Ninetta Lights, MD;  Location: [facility];  Service: Orthopedics;  Laterality: Right;  Allergies  Allergen Reactions  . Ramipril [Ramipril] Rash  . Tramadol Itching and Rash    I have reviewed the patient's current medications . amLODipine  5 mg Oral Daily  . aspirin EC  81 mg Oral Daily  . heparin  5,000 Units Subcutaneous Q8H  . pantoprazole  40 mg Oral Daily  . sodium chloride flush  3 mL Intravenous Q12H     ondansetron, oxyCODONE **AND**  oxyCODONE-acetaminophen  Prior to Admission medications   Medication Sig Start Date End Date Taking? Authorizing Provider  baclofen (LIORESAL) 10 MG tablet Take 10 mg by mouth 2 (two) times daily as needed for muscle spasms.  11/11/15  Yes Historical Provider, MD  BYSTOLIC 5 MG tablet TAKE 1 TABLET BY MOUTH DAILY. 08/25/15  Yes Collier Salina, MD  cetirizine (ZYRTEC) 10 MG tablet Take 10 mg by mouth daily as needed (takes with infusions).   Yes Historical Provider, MD  ferrous sulfate 325 (65 FE) MG tablet Take 325 mg by mouth daily with breakfast.   Yes Historical Provider, MD  INFLIXIMAB IV Inject into the vein.   Yes Historical Provider, MD  ondansetron (ZOFRAN) 4 MG tablet Take 1 tablet (4 mg total) by mouth every 8 (eight) hours as needed for nausea or vomiting. 11/19/14  Yes Aundra Dubin, PA-C  oxyCODONE-acetaminophen (PERCOCET) 10-325 MG tablet Take 1 tablet by mouth daily as needed. 12/08/15  Yes Historical Provider, MD  pantoprazole (PROTONIX) 40 MG tablet TAKE 1 TABLET (40 MG TOTAL) BY MOUTH 2 (TWO) TIMES DAILY. 01/07/15  Yes Bartholomew Crews, MD  oxyCODONE-acetaminophen (ROXICET) 5-325 MG per tablet Take 1-2 tablets by mouth every 4 (four) hours as needed. 03/11/15   Aundra Dubin, PA-C  zolpidem (AMBIEN CR) 6.25 MG CR tablet TAKE 1 TABLET BY MOUTH AT BEDTIME AS NEEDED FOR SLEEP 10/06/15   Collier Salina, MD     Social History   Social History  . Marital Status: Single    Spouse Name: N/A  . Number of Children: N/A  . Years of Education: N/A   Occupational History  . Not on file.   Social History Main Topics  . Smoking status: Current Every Day Smoker -- 0.75 packs/day for 20 years    Types: Cigars, Cigarettes  . Smokeless tobacco: Never Used     Comment: 2-3 cig./day  . Alcohol Use: No  . Drug Use: No  . Sexual Activity: Not on file   Other Topics Concern  . Not on file   Social History Narrative   Patient lives by himself, is on disability since 1992, used  to work with city [employer] [place]. 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 Smokes 1/2 PPD and has been smoking since 25 years. Quit drinking in 1990.    No family status information on file.   Family History  Problem Relation Age of Onset  . Stroke Neg Hx   . Cancer Neg Hx   . Heart disease Father 61    ROS:  Full 14 point review of systems complete and found to be negative unless listed above.  Physical Exam: Blood pressure 136/83, pulse 63, temperature 97.9 F (36.6 C), temperature source Oral, resp. rate 12, height 5\' 8"  (1.727 m), weight 182 lb 9.6 oz (82.827 kg), SpO2 95 %.  General: Well developed, well nourished, male in no acute distress Head: Eyes PERRLA, No xanthomas.   Normocephalic and atraumatic, oropharynx without edema or exudate.  Lungs: CTAB Heart: HRRR S1 S2, no rub/gallop, Heart regular  rate and rhythm with S1, S2  murmur. pulses are 2+ extrem.   Neck: No carotid bruits. No lymphadenopathy. No JVD. Abdomen: Bowel sounds present, abdomen soft and non-tender without masses or hernias noted. Msk:  No spine or cva tenderness. No weakness, no joint deformities or effusions. Extremities: No clubbing or cyanosis. No edema.  Neuro: Alert and oriented X 3. No focal deficits noted. Psych:  Good affect, responds appropriately Skin: No rashes or lesions noted.  Labs:   Lab Results  Component Value Date   WBC 5.8 12/08/2015   HGB 13.4 12/08/2015   HCT 42.1 12/08/2015   MCV 89.4 12/08/2015   PLT 156 12/08/2015   No results for input(s): INR in the last 72 hours.  Recent Labs Lab 12/08/15 2045 12/09/15 0732  NA  --  141  K  --  4.4  CL  --  104  CO2  --  30  BUN  --  9  CREATININE  --  0.92  CALCIUM  --  9.0  PROT 6.4*  --   BILITOT 0.6  --   ALKPHOS 105  --   ALT 212*  --   AST 149*  --   GLUCOSE  --  93  ALBUMIN 3.4*  --    No results found for: MG  Recent Labs  12/08/15 2045 12/09/15 0230 12/09/15 0732  TROPONINI <0.03 <0.03 <0.03    Recent Labs   12/08/15 1623  TROPIPOC 0.00   No results found for: PROBNP Lab Results  Component Value Date   CHOL 151 04/08/2012   HDL 24* 04/08/2012   LDLCALC 73 04/08/2012   TRIG 269* 04/08/2012   Lab Results  Component Value Date   DDIMER 2.62* 11/18/2011   LIPASE  Date/Time Value Ref Range Status  11/07/2011 05:50 AM 16 11 - 59 U/L Final   TSH  Date/Time Value Ref Range Status  12/09/2015 02:30 AM 0.730 0.350 - 4.500 uIU/mL Final  11/20/2011 05:35 AM 0.782 0.350 - 4.500 uIU/mL Final    Echo: 12/09/2015 LV EF: 60% - 65% Study Conclusions - Left ventricle: The cavity size was normal. There was moderate  concentric hypertrophy. Systolic function was normal. The  estimated ejection fraction was in the range of 60% to 65%. Wall  motion was normal; there were no regional wall motion  abnormalities. Doppler parameters are consistent with abnormal  left ventricular relaxation (grade 1 diastolic dysfunction).  Doppler parameters are consistent with indeterminate ventricular  filling pressure. - Aortic valve: There was mild to moderate regurgitation. The  hyperechoic lesion on the R coronary cusp of the aortic valve is  1.0 cm x 0.87 cm. Slightly larger than in 10/2011 (7.5 cm x 7.5  cm) Regurgitation pressure half-time: 244 ms. Aortic valve  regurgitation appears mild visually, but is moderate by pressure  half-time. - Mitral valve: Transvalvular velocity was within the normal range.  There was no evidence for stenosis. There was no regurgitation. - Right ventricle: The cavity size was normal. Wall thickness was  normal. Systolic function was normal. - Atrial septum: No defect or patent foramen ovale was identified  by color flow Doppler. - Tricuspid valve: There was trivial regurgitation. - Pulmonary arteries: Systolic pressure was within the normal  range. PA peak pressure: 28 mm Hg (S).   ECG:  ECG with new TWI in lead III and AVF and well as slight ST  depression in V4-V6.  Radiology:  Dg Chest 2 View  12/08/2015  CLINICAL DATA:  Chest pain and shortness of breath today, smoker, history lupus, hypertension EXAM: CHEST  2 VIEW COMPARISON:  09/25/2012 FINDINGS: Enlargement of cardiac silhouette. Calcified and tortuous thoracic aorta. Minimal atelectasis at base of lingula. Minimal chronic peribronchial thickening. No pulmonary infiltrate, pleural effusion, or pneumothorax. Bones demineralized. IMPRESSION: Enlargement of cardiac silhouette with minimal lingular atelectasis. Electronically Signed   By: Lavonia Dana M.D.   On: 12/08/2015 16:40   Ct Head Wo Contrast  12/08/2015  CLINICAL DATA:  Dizziness with altered gait today. History of hypertension. Initial encounter. EXAM: CT HEAD WITHOUT CONTRAST TECHNIQUE: Contiguous axial images were obtained from the base of the skull through the vertex without intravenous contrast. COMPARISON:  Head CT 04/09/2000 FINDINGS: Brain: There is no evidence of acute intracranial hemorrhage, mass lesion, brain edema or extra-axial fluid collection. The ventricles and subarachnoid spaces are appropriately sized for age. There is no CT evidence of acute cortical infarction. Bones/sinuses/visualized face: There is a stable small mucous retention cyst or polyp in the right maxillary sinus. The visualized paranasal sinuses, mastoid air cells and middle ears are otherwise clear. The calvarium is intact. IMPRESSION: Stable head CT.  No acute intracranial findings. Electronically Signed   By: Richardean Sale M.D.   On: 12/08/2015 19:12   ASSESSMENT AND PLAN:    Principal Problem:   Chest pain Active Problems:   HLD (hyperlipidemia)   Systemic lupus erythematosus (HCC)   Rheumatoid arthritis (HCC)   HTN (hypertension)   Lightheadedness   AKI (acute kidney injury) ([place])  Jariel Snitzer is a 55 y.o. male with a history of lupus/RA with chronic pain, HTN, HLD, GERD, empyema s/p VATS/decortication 2013, aortic valve mass (noted on  TTE in 2013), pulmonary nodules, remote alcoholism in remission (sober 25 years) and ongoing tobacco abuse who presented to [facility] ED on 12/08/15 with chest pain.   Chest pain: troponin neg x3. ECG with new TWI in lead III and AVF and well as slight ST depression in V4-V6. He has many RFs for CAD including HTN, HLD, tobacco abuse, RA/SLE. Calcifications noted on aorta on CXR. Will plan for cardiac CT with calcium score. Will give one dose Lopressor 50mg  daily to get HR in target range.   Aortic valve mass: Repeat 2D ECHO today showed normal LV function, G1DD, mild-mod AR, 1.0 x 0.87 cm mass (slightly larger than previous 7.51mm x 7.51mm). Dr. Meda Coffee will review echo, unlikely to be a vegetation since it has been there for 4 years now.  RA/SLE: it doesn't sound like he is tolerating Infliximaub very well. This may need to be discontinued.   Transaminitis. AST: 149, ALT: 212. Normal at baseline. Last LFT in 2013. Infliximab is hepatotoxic and recent start of this medication could have contributed to his change in liver function.  History of pulmonary nodule: Pulmonary nodules (largest 1mm) found in upper left lobe in 2013 chest CT. Patient is at high risk for malignancy and should have a followup CT in the outpatient setting to further characterize nodules.   Hypertension:BP as high as 174/158. Much improved after labetalol 5mg  IV and started on amlodipine 5mg  daily. Home Bystolic held   Signed: Angelena Form, Hershal Coria 12/09/2015 11:42 AM Pager [phone] Co-Sign MD  The patient was seen, examined and discussed with Lorretta Harp, PA-C and I agree with the above.   This is a very nice 54 year old gentleman with prior medical history with lupus and rheumatoid arthritis with involvement of his metacarpal joints and chronic pain, ongoing tobacco abuse and premature coronary  artery disease in his father was also a smoker, who previously had an echocardiogram done in 2013 which finding of aortic valve  modest measuring 77 mm suspicious of Libman-Sacks endocarditis. Patient was no explained that however he denies any symptoms of stroke. He has been started on infliximab for his rheumatologic problems and has developed significant chest pain which shortness of breath and diaphoresis during infliximab infusions but otherwise no while he's walking or using exerting himself otherwise. He states that he remains fairly active and can certainly walk a few blocks without any chest pain or shortness of breath. I have reviewed his old chest CT dated shows calcification of his aortic valve however I don't see any calcification in his coronary arteries. He EKG shows sinus bradycardia nonspecific ST-T wave abnormality. He's baseline heart rate is low, we will schedule him for call soon score and coronary CTA to rule out coronary artery disease. While he's consider being a high risk. Family history of early CAD, ongoing smoking, to rheumatologic problems that produce post patient still early CAD, untreated hyperlipidemia he symptoms are rather atypical and most probably related to infliximab infusion. His blood pressures elevated, with moderate concentric LVH, I would add amlodipine 5 mg to his medical regimen. With regards to his aortic valve mass that has only minimally enlarged in the last 4 years, with associated mild-to-moderate aortic regurgitation, and this point I would not proceed with any further management unless patient has symptoms of peripheral embolism.  Ena Dawley 12/09/2015

## 2015-12-09 NOTE — Progress Notes (Signed)
Coronary CT showed:  IMPRESSION: 1. Coronary calcium score of 25. This was 81 percentile for age and sex matched control. 2. Normal coronary origin with right and left co-dominance. 3. Mild to moderate non-obstructive plaque in the RCA. An aggressive risk factor modification is recommended. 4. 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 5. Mildly dilated pulmonary artery suggestive of pulmonary Hypertension.    No further work up needed. Continue bystolic 5mg  and amlodipine. Ok to discharge. Follow up in clinic few weeks (office will call with appointment) with CMP at that time.    Braxten Memmer, [place]

## 2015-12-09 NOTE — Progress Notes (Signed)
*  PRELIMINARY RESULTS* Echocardiogram 2D Echocardiogram has been performed.  [name] [name] 12/09/2015, 9:25 AM

## 2015-12-09 NOTE — Progress Notes (Signed)
Subjective: Pt denies any current chest pain. He has no complaints.   Objective: Vital signs in last 24 hours: Filed Vitals:   12/09/15 0555 12/09/15 0615 12/09/15 0630 12/09/15 0700  BP: 151/99 136/95 151/79 136/83  Pulse: 66 62 64 63  Temp: 97.9 F (36.6 C)     TempSrc: Oral     Resp: 13 12 12 12   Height:      Weight: 182 lb 9.6 oz (82.827 kg)     SpO2: 96% 99% 98% 95%   Weight change:   Intake/Output Summary (Last 24 hours) at 12/09/15 0917 Last data filed at 12/09/15 0647  Gross per 24 hour  Intake   1410 ml  Output   1350 ml  Net     60 ml   General: NAD, laying in bed comfortably Lungs: CTAB, no wheezing Cardiac: RRR, no murmurs, gallops, or rubs, SS chest wall non TTP GI: soft, active bowel sounds, non TTP Neuro: CN II-XII grossly intact Skin: warm and dry Ext: b/l ulnar deviation, swan neck deformities in 1st and 2nd digits b/l, and 1st and 2nd hebereden's nodes b/l  Lab Results: Basic Metabolic Panel:  Recent Labs Lab 12/08/15 1551 12/09/15 0732  NA 140 141  K 4.4 4.4  CL 106 104  CO2 28 30  GLUCOSE 122* 93  BUN 11 9  CREATININE 1.43* 0.92  CALCIUM 9.5 9.0   Liver Function Tests:  Recent Labs Lab 12/08/15 2045  AST 149*  ALT 212*  ALKPHOS 105  BILITOT 0.6  PROT 6.4*  ALBUMIN 3.4*   CBC:  Recent Labs Lab 12/08/15 1551  WBC 5.8  HGB 13.4  HCT 42.1  MCV 89.4  PLT 156   Cardiac Enzymes:  Recent Labs Lab 12/08/15 2045 12/09/15 0230 12/09/15 0732  TROPONINI <0.03 <0.03 <0.03   Thyroid Function Tests:  Recent Labs Lab 12/09/15 0230  TSH 0.730   Urine Drug Screen: Drugs of Abuse     Component Value Date/Time   LABOPIA POSITIVE* 12/08/2015 2049   LABOPIA NEG 08/30/2010 1653   COCAINSCRNUR NONE DETECTED 12/08/2015 2049   COCAINSCRNUR NEG 08/30/2010 1653   LABBENZ POSITIVE* 12/08/2015 2049   LABBENZ POS* 08/30/2010 1653   AMPHETMU NONE DETECTED 12/08/2015 2049   AMPHETMU NEG 08/30/2010 1653   THCU NONE DETECTED  12/08/2015 2049   LABBARB NONE DETECTED 12/08/2015 2049    Urinalysis:  Recent Labs Lab 12/08/15 2049  COLORURINE YELLOW  LABSPEC 1.015  PHURINE 6.0  GLUCOSEU NEGATIVE  HGBUR NEGATIVE  BILIRUBINUR NEGATIVE  KETONESUR NEGATIVE  PROTEINUR NEGATIVE  NITRITE NEGATIVE  LEUKOCYTESUR NEGATIVE   Studies/Results: Dg Chest 2 View  12/08/2015  CLINICAL DATA:  Chest pain and shortness of breath today, smoker, history lupus, hypertension EXAM: CHEST  2 VIEW COMPARISON:  09/25/2012 FINDINGS: Enlargement of cardiac silhouette. Calcified and tortuous thoracic aorta. Minimal atelectasis at base of lingula. Minimal chronic peribronchial thickening. No pulmonary infiltrate, pleural effusion, or pneumothorax. Bones demineralized. IMPRESSION: Enlargement of cardiac silhouette with minimal lingular atelectasis. Electronically Signed   By: Lavonia Dana M.D.   On: 12/08/2015 16:40   Ct Head Wo Contrast  12/08/2015  CLINICAL DATA:  Dizziness with altered gait today. History of hypertension. Initial encounter. EXAM: CT HEAD WITHOUT CONTRAST TECHNIQUE: Contiguous axial images were obtained from the base of the skull through the vertex without intravenous contrast. COMPARISON:  Head CT 04/09/2000 FINDINGS: Brain: There is no evidence of acute intracranial hemorrhage, mass lesion, brain edema or extra-axial fluid collection. The ventricles  and subarachnoid spaces are appropriately sized for age. There is no CT evidence of acute cortical infarction. Bones/sinuses/visualized face: There is a stable small mucous retention cyst or polyp in the right maxillary sinus. The visualized paranasal sinuses, mastoid air cells and middle ears are otherwise clear. The calvarium is intact. IMPRESSION: Stable head CT.  No acute intracranial findings. Electronically Signed   By: Richardean Sale M.D.   On: 12/08/2015 19:12   Medications: I have reviewed the patient's current medications. Scheduled Meds: . amLODipine  5 mg Oral Daily  .  aspirin EC  81 mg Oral Daily  . heparin  5,000 Units Subcutaneous Q8H  . pantoprazole  40 mg Oral Daily  . sodium chloride flush  3 mL Intravenous Q12H   Continuous Infusions: . sodium chloride 100 mL/hr at 12/09/15 0005   PRN Meds:.ondansetron, oxyCODONE **AND** oxyCODONE-acetaminophen Assessment/Plan: Principal Problem:   Chest pain Active Problems:   HLD (hyperlipidemia)   Systemic lupus erythematosus (HCC)   Rheumatoid arthritis (HCC)   HTN (hypertension)   Lightheadedness   Typical angina: troponins negative x 3, no longer having chest pain. TSH nl. He has typical anginal chest pain sx although Infliximab has a 1% chance of causing chest pain.  -aspirin 81mg  daily - held off on starting high intensity statin due to elevated LFTs.  - TTE read pending, will consult cardiology due to multiple risk factors. If no further work up recommended as in patient can d/c pt home with sublingual nitrates.  Calcified aortic valve mass-- ECHO 2013 revealed 7.5 mmx7.73mm calcified mass in the rt coronary cusp which may be attached to the valve leaflet or in the aorta above the valve, possibility of it being aortic calcification, calcified thrombus, vegetation, or liebman-sacks endocarditis. Reccommended f/u with TEE, unclear why this was never followed up.  - consider TEE pending TTE read.   Transaminitis-- AST 149 and ALT 212, previously nl in 2013. He was started on infliximab recently which has a 2-10% chance of causing increased serum ALT ?3x ULN. Consider stopping infliximab on discharge.   Hypertension: on bystolic at home, last ECHO 2013 w nl EF and grade 1 diasolic dysfunction, no documented CAD on his problem list. He does have a calcified aorta on CXR, thus likely he does have underlying CAD. He was given IV labetalol once on admission for elevated BP and started on amlodipine 10mg . If cardiac work up negative for CAD likely will continue BB for anginal symptoms.   Acute renal  insufficiency: resolved, creatinine 0.92 this morning, b/l around 0.8-.0.9  Rheumatoid arthritis: He has been on leflunomaide and methotrexate in the past but its unclear why he was changed to infliximab two weeks ago. Infliximab could be contributing to his coronary artery disease per above.  Tobacco abuse w/ abn CT chest findings-- pt is a current everyday smoker and in 2013 had a chest CT for rt pleuritic chest pain that found indeterminate b/l pulmonary nodules w/ largest measuring 62mm in the left upper lobe. It was recommended that pt f/u w/ chest CT in 3-12 months depending on pre test probablity for bronchogenic cancer. Pt was 55 y/o at the time but has a long smoking hx, he would have qualified for repeat CT in 6-12 months per Fleischner criteria . No documented repeat CT scan on file, will recommended CT chest as outpatient for screening.   Chronic back pain: We will resume his home opiate regimen. -Continue Percocet 10-325 every 4 hours as needed for pain  Dispo: Disposition is deferred at this time, awaiting improvement of current medical problems.   The patient does have a current PCP Collier Salina, MD) and does need an [facility] hospital follow-up appointment after discharge.  The patient does not know have transportation limitations that hinder transportation to clinic appointments .Services Needed at time of discharge: Y = Yes, Blank = No PT:   OT:   RN:   Equipment:   Other:       Norman Herrlich, MD 12/09/2015, 9:17 AM

## 2015-12-09 NOTE — Care Management Obs Status (Signed)
Media NOTIFICATION   Patient Details  Name: [name] [name] MRN: [id number] Date of Birth: Nov 17, 1960   Medicare Observation Status Notification Given:  Yes    Bethena Roys, RN 12/09/2015, 12:16 PM

## 2015-12-10 DIAGNOSIS — R079 Chest pain, unspecified: Secondary | ICD-10-CM | POA: Diagnosis not present

## 2015-12-10 DIAGNOSIS — N289 Disorder of kidney and ureter, unspecified: Secondary | ICD-10-CM | POA: Diagnosis not present

## 2015-12-10 DIAGNOSIS — F17210 Nicotine dependence, cigarettes, uncomplicated: Secondary | ICD-10-CM | POA: Diagnosis not present

## 2015-12-10 DIAGNOSIS — I10 Essential (primary) hypertension: Secondary | ICD-10-CM | POA: Diagnosis not present

## 2015-12-10 DIAGNOSIS — R001 Bradycardia, unspecified: Secondary | ICD-10-CM | POA: Diagnosis not present

## 2015-12-10 LAB — HEMOGLOBIN A1C
Hgb A1c MFr Bld: 5.3 % (ref 4.8–5.6)
Mean Plasma Glucose: 105 mg/dL

## 2015-12-10 LAB — HIV-1 RNA ULTRAQUANT REFLEX TO GENTYP+
HIV-1 RNA BY PCR: <20 copies/mL
HIV-1 RNA Quant, Log: UNDETERMINED log10copy/mL

## 2015-12-10 LAB — C-REACTIVE PROTEIN: CRP: 2.6 mg/dL — ABNORMAL HIGH (ref <1.0)

## 2015-12-10 LAB — HEPATIC FUNCTION PANEL
ALK PHOS: 106 U/L (ref 38–126)
ALT: 122 U/L — AB (ref 17–63)
AST: 33 U/L (ref 15–41)
Albumin: 3.6 g/dL (ref 3.5–5.0)
BILIRUBIN TOTAL: 0.3 mg/dL (ref 0.3–1.2)
Bilirubin, Direct: <0.1 mg/dL — ABNORMAL LOW (ref 0.1–0.5)
Total Protein: 6.7 g/dL (ref 6.5–8.1)

## 2015-12-10 LAB — HEPATITIS C ANTIBODY: HCV Ab: <0.1 s/co ratio (ref 0.0–0.9)

## 2015-12-10 LAB — HCV COMMENT:

## 2015-12-10 LAB — HIV ANTIBODY (ROUTINE TESTING W REFLEX): HIV Screen 4th Generation wRfx: NONREACTIVE

## 2015-12-10 LAB — HEPATITIS B SURFACE ANTIBODY,QUALITATIVE: HEP B S AB: NONREACTIVE

## 2015-12-10 LAB — SEDIMENTATION RATE: SED RATE: 33 mm/h — AB (ref 0–16)

## 2015-12-10 LAB — HEPATITIS C ANTIBODY (REFLEX)

## 2015-12-10 MED ORDER — AMLODIPINE BESYLATE 5 MG PO TABS: 5.0000 mg | ORAL_TABLET | Freq: Every day | ORAL | Status: DC

## 2015-12-10 MED ORDER — NITROGLYCERIN 0.4 MG/SPRAY TL SOLN: 1.0000 | Status: DC | PRN

## 2015-12-10 MED ORDER — NEBIVOLOL HCL 5 MG PO TABS
5.0000 mg | ORAL_TABLET | Freq: Every day | ORAL | Status: DC
  Administered 2015-12-10: 5 mg via ORAL
  Filled 2015-12-10: qty 1

## 2015-12-10 MED ORDER — ASPIRIN 81 MG PO TBEC: 81.0000 mg | DELAYED_RELEASE_TABLET | Freq: Every day | ORAL | Status: DC

## 2015-12-10 NOTE — Discharge Summary (Signed)
Name: [name] [name] MRN: [id number] DOB: November 25, 1960 55 y.o. PCP: [name] Salina, MD  Date of Admission: 12/08/2015  4:51 PM Date of Discharge: 12/10/2015 Attending Physician: [name] Filler, MD  Discharge Diagnosis: Principal Problem:   Chest pain Active Problems:   HLD (hyperlipidemia)   Systemic lupus erythematosus ([place])   Rheumatoid arthritis (HCC)   HTN (hypertension)   Lightheadedness   AKI (acute kidney injury) ([place])   Sinus bradycardia  Discharge Medications:   Medication List    STOP taking these medications        INFLIXIMAB IV      TAKE these medications        amLODipine 5 MG tablet  Commonly known as:  NORVASC  Take 1 tablet (5 mg total) by mouth daily.     aspirin 81 MG EC tablet  Take 1 tablet (81 mg total) by mouth daily.     baclofen 10 MG tablet  Commonly known as:  LIORESAL  Take 10 mg by mouth 2 (two) times daily as needed for muscle spasms.     BYSTOLIC 5 MG tablet  Generic drug:  nebivolol  TAKE 1 TABLET BY MOUTH DAILY.     cetirizine 10 MG tablet  Commonly known as:  ZYRTEC  Take 10 mg by mouth daily as needed (takes with infusions).     ferrous sulfate 325 (65 FE) MG tablet  Take 325 mg by mouth daily with breakfast.     nitroGLYCERIN 0.4 MG/SPRAY spray  Commonly known as:  NITROLINGUAL  Place 1 spray under the tongue every 5 (five) minutes x 3 doses as needed for chest pain.     ondansetron 4 MG tablet  Commonly known as:  ZOFRAN  Take 1 tablet (4 mg total) by mouth every 8 (eight) hours as needed for nausea or vomiting.     oxyCODONE-acetaminophen 5-325 MG tablet  Commonly known as:  ROXICET  Take 1-2 tablets by mouth every 4 (four) hours as needed.     oxyCODONE-acetaminophen 10-325 MG tablet  Commonly known as:  PERCOCET  Take 1 tablet by mouth daily as needed.     pantoprazole 40 MG tablet  Commonly known as:  PROTONIX  TAKE 1 TABLET (40 MG TOTAL) BY MOUTH 2 (TWO) TIMES DAILY.     zolpidem 6.25 MG CR  tablet  Commonly known as:  AMBIEN CR  TAKE 1 TABLET BY MOUTH AT BEDTIME AS NEEDED FOR SLEEP        Disposition and follow-up:   [name] was discharged from [facility] in Good condition.  At the hospital follow up visit please address:  1.  Consider hep b and hep A vaccinations.   2.  Labs / imaging needed at time of follow-up: CT chest in 6-12 months. Repeat LFTs.   3.  Pending labs/ test needing follow-up: none  Follow-up Appointments:     Follow-up Information    Follow up with SYED, [name] Dun, MD. Go on 12/13/2015.   Specialty:  Rheumatology   Why:  Hospital follow-up and medication review   Contact information:   [address] [place] [address] [phone]       Follow up with [name] Groves, DO. Go on 12/16/2015.   Specialty:  Internal Medicine   Why:  Hospital Follow up at 8:45am   Contact information:   [address] [place] [address] [phone]        Consultations: Treatment Team:  Rounding Lbcardiology, MD  Procedures Performed:  Dg Chest 2 View  12/08/2015  CLINICAL DATA:  Chest pain and shortness of breath today, smoker, history lupus, hypertension EXAM: CHEST  2 VIEW COMPARISON:  09/25/2012 FINDINGS: Enlargement of cardiac silhouette. Calcified and tortuous thoracic aorta. Minimal atelectasis at base of lingula. Minimal chronic peribronchial thickening. No pulmonary infiltrate, pleural effusion, or pneumothorax. Bones demineralized. IMPRESSION: Enlargement of cardiac silhouette with minimal lingular atelectasis. Electronically Signed   By: [name] [name] M.D.   On: 12/08/2015 16:40   Ct Head Wo Contrast  12/08/2015  CLINICAL DATA:  Dizziness with altered gait today. History of hypertension. Initial encounter. EXAM: CT HEAD WITHOUT CONTRAST TECHNIQUE: Contiguous axial images were obtained from the base of the skull through the vertex without intravenous contrast. COMPARISON:  Head CT 04/09/2000  FINDINGS: Brain: There is no evidence of acute intracranial hemorrhage, mass lesion, brain edema or extra-axial fluid collection. The ventricles and subarachnoid spaces are appropriately sized for age. There is no CT evidence of acute cortical infarction. Bones/sinuses/visualized face: There is a stable small mucous retention cyst or polyp in the right maxillary sinus. The visualized paranasal sinuses, mastoid air cells and middle ears are otherwise clear. The calvarium is intact. IMPRESSION: Stable head CT.  No acute intracranial findings. Electronically Signed   By: [name] [name] M.D.   On: 12/08/2015 19:12   Ct Coronary Morph W/cta Cor W/score W/ca W/cm &/or Wo/cm  12/09/2015  ADDENDUM REPORT: 12/09/2015 17:34 CLINICAL DATA:  55 year old male with chest pain EXAM: Cardiac/Coronary  CT TECHNIQUE: The patient was scanned on a Philips 256 scanner. FINDINGS: A 120 kV prospective scan was triggered in the descending thoracic aorta at 111 HU's. Axial non-contrast 3 mm slices were carried out through the heart. The data set was analyzed on a dedicated work station and scored using the [place]. Gantry rotation speed was 270 msecs and collimation was .9 mm. 50 mg of PO metoprolol and 0.8 mg of sl NTG was given. The 3D data set was reconstructed in 5% intervals of the 67-82 % of the R-R cycle. Diastolic phases were analyzed on a dedicated work station using MPR, MIP and VRT modes. The patient received 80 cc of contrast. Aorta:  Normal size, mild calcifications in the aortic arch. Aortic Valve: Trileaflet. There is a focal thickening and calcification of the right coronary cusp measuring 9 x 7 mm. Coronary Arteries:  Normal origin.  Right and left codominance. Left main is a large artery that has minimal calcified plaque in its distal segment with associated stenosis 0-25%. LAD is a large artery that gives rise to two diagonal branches and has no significant plaque. There is an intramyocardial bridge in the mid  to distal LAD. LCX artery is a medium caliber vessel that gives rise to two obtuse marginal branches and a PLA. There is no plaque. RCA is a small caliber vessel that gives rise to an acute marginal branch and PDA. There is mild calcified plaque associated with 25-50% stenosis at the mid RCA at the takeoff of the acute marginal branch. There is also a long non-calcified narrowing of the mid RCA associated with 25-50% stenosis. Other findings: Mildly dilated pulmonary artery suggestive of pulmonary hypertension. Normal pulmonary vein drainage into the left atrium. No ASD/VSD. No lefta atrial appendage thrombus. IMPRESSION: 1. Coronary calcium score of 25. This was 61 percentile for age and sex matched control. 2.  Normal coronary origin with right and left co-dominance. 3. Mild to moderate non-obstructive plaque  in the RCA. An aggressive risk factor modification is recommended. 4.  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 5. Mildly dilated pulmonary artery suggestive of pulmonary hypertension. Ena Dawley Electronically Signed   By: Ena Dawley   On: 12/09/2015 17:34  12/09/2015  EXAM: OVER-READ INTERPRETATION  CT CHEST The following report is an over-read performed by radiologist Dr. Rebekah Chesterfield [facility] on 12/09/2015. This over-read does not include interpretation of cardiac or coronary anatomy or pathology. The coronary calcium score/coronary CTA interpretation by the cardiologist is attached. COMPARISON:  Chest CT 11/18/2011. FINDINGS: 6 mm ground-glass attenuation nodule in the left upper lobe (image 9 of series 204). Within the visualized portions of the thorax there are no larger more suspicious appearing pulmonary nodules or masses, there is no acute consolidative airspace disease, no pleural effusions, no pneumothorax and no lymphadenopathy. Scattered areas of mild linear scarring are noted throughout the visualized lung bases. Diffuse low attenuation  throughout the visualized hepatic parenchyma, compatible with hepatic steatosis. There are no aggressive appearing lytic or blastic lesions noted in the visualized portions of the skeleton. IMPRESSION: 1. 6 mm ground-glass attenuation nodule in the left upper lobe. Initial follow-up with CT at 6-12 months is recommended to confirm persistence. If persistent, repeat CT is recommended every 2 years until 5 years of stability has been established. This recommendation follows the consensus statement: Guidelines for Management of Incidental Pulmonary Nodules Detected on CT Images:From the Fleischner Society 2017; published online before print (10.1148/radiol.IJ:2314499). 2. Hepatic steatosis. Electronically Signed: By: Vinnie Langton M.D. On: 12/09/2015 17:09    2D Echo: 12/09/15 Left ventricle: The cavity size was normal. There was moderate  concentric hypertrophy. Systolic function was normal. The  estimated ejection fraction was in the range of 60% to 65%. Wall  motion was normal; there were no regional wall motion  abnormalities. Doppler parameters are consistent with abnormal  left ventricular relaxation (grade 1 diastolic dysfunction).  Doppler parameters are consistent with indeterminate ventricular  filling pressure. - Aortic valve: There was mild to moderate regurgitation. The  hyperechoic lesion on the R coronary cusp of the aortic valve is  1.0 cm x 0.87 cm. Slightly larger than in 10/2011 (7.5 cm x 7.5  cm) Regurgitation pressure half-time: 244 ms. Aortic valve  regurgitation appears mild visually, but is moderate by pressure  half-time. - Mitral valve: Transvalvular velocity was within the normal range.  There was no evidence for stenosis. There was no regurgitation. - Right ventricle: The cavity size was normal. Wall thickness was  normal. Systolic function was normal. - Atrial septum: No defect or patent foramen ovale was identified  by color flow Doppler. - Tricuspid  valve: There was trivial regurgitation. - PulmonLeft ventricle: The cavity size was normal. There was moderate  concentric hypertrophy. Systolic function was normal. The  estimated ejection fraction was in the range of 60% to 65%. Wall  motion was normal; there were no regional wall motion  abnormalities. Doppler parameters are consistent with abnormal  left ventricular relaxation (grade 1 diastolic dysfunction).  Doppler parameters are consistent with indeterminate ventricular  filling pressure. - Aortic valve: There was mild to moderate regurgitation. The  hyperechoic lesion on the R coronary cusp of the aortic valve is  1.0 cm x 0.87 cm. Slightly larger than in 10/2011 (7.5 cm x 7.5  cm) Regurgitation pressure half-time: 244 ms. Aortic valve  regurgitation appears mild visually, but is moderate by pressure  half-time. - Mitral valve: Transvalvular velocity  was within the normal range.  There was no evidence for stenosis. There was no regurgitation. - Right ventricle: The cavity size was normal. Wall thickness was  normal. Systolic function was normal. - Atrial septum: No defect or patent foramen ovale was identified  by color flow Doppler. - Tricuspid valve: There was trivial regurgitation. - Pulmonary arteries: Systolic pressure was within the normal  range. PA peak pressure: 28 mm Hg (S).ary arteries: Systolic pressure was within the normal  range. PA peak pressure: 28 mm Hg (S).    Admission HPI: Mr. Fillinger is a 55 year old man with rheumatoid arthritis recently started on infliximab 2 weeks ago, self-reported history of lupus with normal ANA titers in 2013, history of a 7.5x7.31mm mass on his aortic valve seen on a TTE in 2013 that was never further investigated, hypertension, and tobacco abuse here with chest pain.  Two weeks ago, he started infliximab infusions for his rheumatoid arthritis. Since that time, he has had nearly daily episodes of 10/10 substernal  chest pressure with associated diaphoresis and nausea. These episodes are usually non-exertional, but he does have some stable angina that is not as bad. After these episodes, he feels "swimmy-headed, like [he's] drunk." He also says his blood pressure has been erratic for the last two weeks in the 200s/100s so he took two of his nabivolol pills this morning. Today, he had an episode that lasted about 30 minutes so he decided to come into the emergency room. He denies any radiation to his back, pleuritic chest pain, tenderness to palpation, pain worse after eating or lying flat, orthopnea, or paroxysmal nocturnal dyspnea; review of systems was otherwise non-revealing.  Hospital Course by problem list: Principal Problem:   Chest pain Active Problems:   HLD (hyperlipidemia)   Systemic lupus erythematosus (HCC)   Rheumatoid arthritis (HCC)   HTN (hypertension)   Lightheadedness   AKI (acute kidney injury) ([place])   Sinus bradycardia   Chest pain: Troponins negative x 3, no longer having chest pain the following day after admission. He has typical anginal chest pain sx although Infliximab has a 1% chance of causing chest pain. He did not initally report pleuritic chest pain on admission but on further questioning he may have had pleurtic chest pain. Thus possibility of pleurisy from lupus; ANA was negative and anti ds DNA was negative. He was not started on a statin this admission due to elevated LFTs. Cardiology consulted and ordered CT w/ calcium scoring which revealed a coronary calcium score of 25.  It was ecommended to continue home bystolic 5mg  and newly started norvasc 5mg  during this admission on discharge. Follow up with cardiology clinic will be made by their office.    Calcified aortic valve mass-- ECHO 2013 revealed 7.5 mmx7.35mm calcified mass in the rt coronary cusp which may be attached to the valve leaflet or in the aorta above the valve, possibility of it being aortic calcification,  calcified thrombus, vegetation, or liebman-sacks endocarditis. TTE this admission reveals increase in aortic mass but per cardiology recommendations will hold off on anticoagulation until he has sx of embolism.   Transaminitis-- AST 149 and ALT 212, previously nl in 2013. He was started on infliximab recently which has a 2-10% chance of causing increased serum ALT ?3x ULN. CT chest revealed evidence of hepatic steatosis which can cause a 2-5x increase in ULN of AST and ALT. HIV negative. Hep B surface antibody negative, reports never receiving hep b vaccination. Hep b core antibody and  hep a antibody were both negative.     Rheumatoid arthritis: recommend stopping infliximab due to LFT abnormalities.   Tobacco abuse w/ abn CT chest findings-- pt is a current everyday smoker and in 2013 had a chest CT for rt pleuritic chest pain that found indeterminate b/l pulmonary nodules w/ largest measuring 3mm in the left upper lobe.  CT chest this admission revealed 6 mm ground-glass attenuation nodule in the left upper lobe. Initial follow-up with CT at 6-12 months is recommended to confirmpersistence. If persistent, repeat CT is recommended every 2 years until 5 years of stability has been established.   Discharge Vitals:   BP 148/106 mmHg  Pulse 86  Temp(Src) 99.3 F (37.4 C) (Oral)  Resp 16  Ht 5\' 8"  (1.727 m)  Wt 180 lb (81.647 kg)  BMI 27.38 kg/m2  SpO2 96%  Discharge Labs:  Results for orders placed or performed during the hospital encounter of 12/08/15 (from the past 24 hour(s))  Hepatitis B surface antibody     Status: None   Collection Time: 12/09/15  4:28 PM  Result Value Ref Range   Hep B S Ab Non Reactive   Hepatitis C antibody     Status: None   Collection Time: 12/09/15  4:28 PM  Result Value Ref Range   HCV Ab <0.1 0.0 - 0.9 s/co ratio  HIV antibody     Status: None   Collection Time: 12/09/15  4:28 PM  Result Value Ref Range   HIV Screen 4th Generation wRfx Non Reactive  Non Reactive  Sedimentation rate     Status: Abnormal   Collection Time: 12/10/15 10:08 AM  Result Value Ref Range   Sed Rate 33 (H) 0 - 16 mm/hr  C-reactive protein     Status: Abnormal   Collection Time: 12/10/15 10:08 AM  Result Value Ref Range   CRP 2.6 (H) <1.0 mg/dL    Signed: Norman Herrlich, MD 12/10/2015, 3:03 PM    Services Ordered on Discharge: none Equipment Ordered on Discharge: none

## 2015-12-10 NOTE — Progress Notes (Signed)
Internal Medicine Attending:   I saw and examined the patient. I reviewed the resident's note and I agree with the resident's findings and plan as documented in the resident's note.  55 year old man treated for rheumatoid arthritis admitted on 6/14 for chest pain evaluation. He feels well today with resolution of symptoms. No elevation in cardiac enzymes so low risk for ACS. Cardiology consulted and performed a CT coronary calcium score which returned with age expected levels of calcium deposition. I think this makes it less likely that his chest pain was due to flow limiting CAD. The patient attributes the pain to starting Infliximab because of the timing, which is possible given its package insert. I also wonder about his reported history of SLE; he is not on plaquenil and I am not sure how infliximab would affect lupus. His chest pain was pleuritic on presentation.   On exam he looks well today. No symptoms. No synovitis. We are going to walk him today, and if he feels well we can discharge him to home. He should follow up with [place] in [facility] to ensure transaminitis and symptoms have resolved. He will have to follow up with rheumatology to talk about alternative DMARDs if he does not want to pursue future infliximab infusions.

## 2015-12-10 NOTE — Progress Notes (Signed)
Subjective: No acute events overnight. Denies dizziness, diaphoresis, chest pain, and nausea   Objective: Vital signs in last 24 hours: Filed Vitals:   12/09/15 1345 12/09/15 2038 12/10/15 0500 12/10/15 0813  BP: 135/86 136/82 125/78 154/108  Pulse: 69 64 72   Temp: 97.8 F (36.6 C) 98.4 F (36.9 C) 97.9 F (36.6 C)   TempSrc: Oral Oral Oral   Resp: _0 Height:      Weight:   81.647 kg (180 lb)   SpO2: 96% 94% 93%     Intake/Output Summary (Last 24 hours) at 12/10/15 8185 Last data filed at 12/10/15 0400  Gross per 24 hour  Intake    363 ml  Output   1300 ml  Net   -937 ml    Physical Exam General: no acute distress, cooperative, comfortably laying on bed Cardiovascular: regular rate and rhythm, murmur ausculatated Pulmonary: clear to auscultation bilaterally  Abdominal: normal active bowel sounds, non-tender, non-distended Extermities: no peripheral edema MSK: ulnar deviation appreciated in hands bilaterally  Lab Results: Basic Metabolic Panel:  Recent Labs  12/08/15 1551 12/09/15 0732  NA 140 141  K 4.4 4.4  CL 106 104  CO2 28 30  GLUCOSE 122* 93  BUN 11 9  CREATININE 1.43* 0.92  CALCIUM 9.5 9.0   Liver Function Tests:  Recent Labs  12/08/15 2045  AST 149*  ALT 212*  ALKPHOS 105  BILITOT 0.6  PROT 6.4*  ALBUMIN 3.4*  CBC:  Recent Labs  12/08/15 1551  WBC 5.8  HGB 13.4  HCT 42.1  MCV 89.4  PLT 156   Cardiac Enzymes:  Recent Labs  12/08/15 2045 12/09/15 0230 12/09/15 0732  TROPONINI <0.03 <0.03 <0.03   Thyroid Function Tests:  Recent Labs  12/09/15 0230  TSH 0.730     Studies/Results: Dg Chest 2 View  12/08/2015  CLINICAL DATA:  Chest pain and shortness of breath today, smoker, history lupus, hypertension EXAM: CHEST  2 VIEW COMPARISON:  09/25/2012 FINDINGS: Enlargement of cardiac silhouette. Calcified and tortuous thoracic aorta. Minimal atelectasis at base of lingula. Minimal chronic peribronchial thickening. No  pulmonary infiltrate, pleural effusion, or pneumothorax. Bones demineralized. IMPRESSION: Enlargement of cardiac silhouette with minimal lingular atelectasis. Electronically Signed   By: Lavonia Dana M.D.   On: 12/08/2015 16:40   Ct Head Wo Contrast  12/08/2015  CLINICAL DATA:  Dizziness with altered gait today. History of hypertension. Initial encounter. EXAM: CT HEAD WITHOUT CONTRAST TECHNIQUE: Contiguous axial images were obtained from the base of the skull through the vertex without intravenous contrast. COMPARISON:  Head CT 04/09/2000 FINDINGS: Brain: There is no evidence of acute intracranial hemorrhage, mass lesion, brain edema or extra-axial fluid collection. The ventricles and subarachnoid spaces are appropriately sized for age. There is no CT evidence of acute cortical infarction. Bones/sinuses/visualized face: There is a stable small mucous retention cyst or polyp in the right maxillary sinus. The visualized paranasal sinuses, mastoid air cells and middle ears are otherwise clear. The calvarium is intact. IMPRESSION: Stable head CT.  No acute intracranial findings. Electronically Signed   By: Richardean Sale M.D.   On: 12/08/2015 19:12   Ct Coronary Morph W/cta Cor W/score W/ca W/cm &/or Wo/cm  12/09/2015  ADDENDUM REPORT: 12/09/2015 17:34 CLINICAL DATA:  55 year old male with chest pain EXAM: Cardiac/Coronary  CT TECHNIQUE: The patient was scanned on a Philips 256 scanner. FINDINGS: A 120 kV prospective scan was triggered in the descending thoracic aorta at 111 HU's. Axial non-contrast  3 mm slices were carried out through the heart. The data set was analyzed on a dedicated work station and scored using the [place]. Gantry rotation speed was 270 msecs and collimation was .9 mm. 50 mg of PO metoprolol and 0.8 mg of sl NTG was given. The 3D data set was reconstructed in 5% intervals of the 67-82 % of the R-R cycle. Diastolic phases were analyzed on a dedicated work station using MPR, MIP and VRT  modes. The patient received 80 cc of contrast. Aorta:  Normal size, mild calcifications in the aortic arch. Aortic Valve: Trileaflet. There is a focal thickening and calcification of the right coronary cusp measuring 9 x 7 mm. Coronary Arteries:  Normal origin.  Right and left codominance. Left main is a large artery that has minimal calcified plaque in its distal segment with associated stenosis 0-25%. LAD is a large artery that gives rise to two diagonal branches and has no significant plaque. There is an intramyocardial bridge in the mid to distal LAD. LCX artery is a medium caliber vessel that gives rise to two obtuse marginal branches and a PLA. There is no plaque. RCA is a small caliber vessel that gives rise to an acute marginal branch and PDA. There is mild calcified plaque associated with 25-50% stenosis at the mid RCA at the takeoff of the acute marginal branch. There is also a long non-calcified narrowing of the mid RCA associated with 25-50% stenosis. Other findings: Mildly dilated pulmonary artery suggestive of pulmonary hypertension. Normal pulmonary vein drainage into the left atrium. No ASD/VSD. No lefta atrial appendage thrombus. IMPRESSION: 1. Coronary calcium score of 25. This was 49 percentile for age and sex matched control. 2.  Normal coronary origin with right and left co-dominance. 3. Mild to moderate non-obstructive plaque in the RCA. An aggressive risk factor modification is recommended. 4.  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 5. Mildly dilated pulmonary artery suggestive of pulmonary hypertension. Ena Dawley Electronically Signed   By: Ena Dawley   On: 12/09/2015 17:34  12/09/2015  EXAM: OVER-READ INTERPRETATION  CT CHEST The following report is an over-read performed by radiologist Dr. Rebekah Chesterfield [facility] on 12/09/2015. This over-read does not include interpretation of cardiac or coronary anatomy or pathology. The coronary calcium  score/coronary CTA interpretation by the cardiologist is attached. COMPARISON:  Chest CT 11/18/2011. FINDINGS: 6 mm ground-glass attenuation nodule in the left upper lobe (image 9 of series 204). Within the visualized portions of the thorax there are no larger more suspicious appearing pulmonary nodules or masses, there is no acute consolidative airspace disease, no pleural effusions, no pneumothorax and no lymphadenopathy. Scattered areas of mild linear scarring are noted throughout the visualized lung bases. Diffuse low attenuation throughout the visualized hepatic parenchyma, compatible with hepatic steatosis. There are no aggressive appearing lytic or blastic lesions noted in the visualized portions of the skeleton. IMPRESSION: 1. 6 mm ground-glass attenuation nodule in the left upper lobe. Initial follow-up with CT at 6-12 months is recommended to confirm persistence. If persistent, repeat CT is recommended every 2 years until 5 years of stability has been established. This recommendation follows the consensus statement: Guidelines for Management of Incidental Pulmonary Nodules Detected on CT Images:From the Fleischner Society 2017; published online before print (10.1148/radiol.[id number]). 2. Hepatic steatosis. Electronically Signed: By: Vinnie Langton M.D. On: 12/09/2015 17:09   Medications:  Scheduled Meds: . amLODipine  5 mg Oral Daily  . aspirin EC  81 mg  Oral Daily  . heparin  5,000 Units Subcutaneous Q8H  . pantoprazole  40 mg Oral Daily  . sodium chloride flush  3 mL Intravenous Q12H   Continuous Infusions:   PRN Meds:.nitroGLYCERIN, ondansetron, oxyCODONE **AND** oxyCODONE-acetaminophen  Problem List Principal Problem:   Chest pain Active Problems:   HLD (hyperlipidemia)   Systemic lupus erythematosus (HCC)   Rheumatoid arthritis (HCC)   HTN (hypertension)   Lightheadedness   AKI (acute kidney injury) (HCC)   Sinus bradycardia   Assessment/Plan: [name] [name] is a 55 year old  man with a history of RA, SLE, HTN, and 7.5x7.26m mass on aortic valve seen on TTE who presented with substernal chest pressure with associated dizziness, diaphoresis,  and nausea  Chest pain:  CAD vs. Infliximab induced vs. MSK etiology. No complaints of chest pain today.  Episode of chest pain that is relieved with rest with associated nausea and diaphoresis meets criteria for typical angina.   ECG showed no ST elevations but new T wave inversions in inferior leads. Troponinx3 negative.  ECG and negative troponins consistent with unstable angina.  Patient is at high risk of coronary arey disease for coronary artery disease with history of HLD, HTN, smoking history and family history of CAD. TTE yesterday showed a slight increase in mass to 1.0x0.83 cm and mild to moderate aortic regurgitation.  Coronary calcium score showed no obstruction in the coronary arteries. No further workup recommend per cardiology .  Patient recently began receiving infliximab infusions which has been associated with a low risk of post transfusion chest pain and thrombosis.  Infliximab will continue to be held.  -- hold infliximab -- continue aspirin '81mg'$  -- repeat EKG  Acute Kidney Injury--Resolved:  Creatinine down to 0.92 (1.43 on admission) after IVF.  Baseline Cr 0.9.  UA negative for infection but showed mild dehydration as specific gravity was 0.015.  Hypertension--controlled:  Recommended to continue Norvasc and restart Bystolic upon discharge -- continue Norvasc '5mg'$  on discharge  -- restart bystolic '5mg'$  on discharge  Rheumatoid Arthritis:  Recently started on  IV infliximab with last dose on 12/01/2015.  Previously on etanercept and leflunomide.  Continue to hold infliximab as it may have contributed to his presentation.  -- hold Infliximab  History of SLE:  Per patient, was diagnosed with lupus in 1992 after exhibiting cutaneous and rheumatological symptoms.  Not currently on SLE home medications.  Last outpatient  labs (08/04/15) show CRP 5.7 and ESR 22.  Plan to order ANA anti-dsDNA antibodies. Positive Anti-dsDNA antibody is associated with increased risk of ESRD. Inflammatory markers ESR and CRP also ordered. -- Anti-dsDNA amd ANA antibody ordered -- ESR and CRP  Transaminitis.  LFTs on admission showed AST: 149, ALT: 212. Differential includes viral hepatitis vs. Non Alcoholic Fatty Liver Disease vs. Medication induced. Out patient lab review shows that his LFTs were have been abnormal in the past year with Alk phos and ALT trending in the mid 100s and AST elevated to as high as 130. Last outpatient LFT (11/08/15) showed ALT 150, AST 34, Alk phos 132 and Albumin 4.1. Infliximab is hepatotoxic and recent start of this medication could have contributed to his change in liver function. CT chest showed hepatic parenchymal changes concerning of hepatic steatosis which could also be a contributing factor.  HCV, HIV and HBV testing completed: HIV non-reactive, Hep B Surface antibody non-reactive, and HCV antibody screen negative for infection.  Total Hep B core antibody and Hep A antibodies ordered to rule out active/chronic  disease.  Non-reactive Hep B surface antibody suggests that patient is not vaccinated against Hep B.  -- hold Infliximab -- repeat LFTs -- Total Hep A antibody -- Total Hep B core antibody  History of pulmonary nodule:  Pulmonary nodules (largest 67m) found in upper left lobe in 2013 chest CT.  R/p Chest CT (12/09/15) found 65mnodule in left upper lobe. Patient is at high risk for malignancy and should have a followup CT in 6-12 months for reevaluation  Chronic back pain Pain: home pain medication resumed -  Percocet [id number] PRN  Dispo: Likely today as patient is asymptomatic.  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  The care of the patient was discussed with Dr. TruHulen Lusterd the assessment and plan formulated with their  assistance.  Please see their attached note for official documentation of the daily encounter.     DarGarald Bravered Student 12/10/2015, 8:32 AM

## 2015-12-10 NOTE — Discharge Instructions (Signed)
Follow up with your Rheumatologist (Dr. Dossie Der) at [facility] on December 13, 2015 at 9:45am to go over Rheumatoid Arthritis treatment options  Follow up at the [facility] with Dr. Heber [place] on December 16, 2015 at 8:45am for hospital follow-up and follow-up for abnormal liver function lab tests    Heart-Healthy Eating Plan Many factors influence your heart health, including eating and exercise habits. Heart (coronary) risk increases with abnormal blood fat (lipid) levels. Heart-healthy meal planning includes limiting unhealthy fats, increasing healthy fats, and making other small dietary changes. This includes maintaining a healthy body weight to help keep lipid levels within a normal range. WHAT IS MY PLAN?  Your health care provider recommends that you:  Get no more than _________% of the total calories in your daily diet from fat.  Limit your intake of saturated fat to less than _________% of your total calories each day.  Limit the amount of cholesterol in your diet to less than _________ mg per day. WHAT TYPES OF FAT SHOULD I CHOOSE?  Choose healthy fats more often. Choose monounsaturated and polyunsaturated fats, such as olive oil and canola oil, flaxseeds, walnuts, almonds, and seeds.  Eat more omega-3 fats. Good choices include salmon, mackerel, sardines, tuna, flaxseed oil, and ground flaxseeds. Aim to eat fish at least two times each week.  Limit saturated fats. Saturated fats are primarily found in animal products, such as meats, butter, and cream. Plant sources of saturated fats include palm oil, palm kernel oil, and coconut oil.  Avoid foods with partially hydrogenated oils in them. These contain trans fats. Examples of foods that contain trans fats are stick margarine, some tub margarines, cookies, crackers, and other baked goods. WHAT GENERAL GUIDELINES DO I NEED TO FOLLOW?  Check food labels carefully to identify foods with trans fats or high amounts of  saturated fat.  Fill one half of your plate with vegetables and green salads. Eat 4-5 servings of vegetables per day. A serving of vegetables equals 1 cup of raw leafy vegetables,  cup of raw or cooked cut-up vegetables, or  cup of vegetable juice.  Fill one fourth of your plate with whole grains. Look for the word "whole" as the first word in the ingredient list.  Fill one fourth of your plate with lean protein foods.  Eat 4-5 servings of fruit per day. A serving of fruit equals one medium whole fruit,  cup of dried fruit,  cup of fresh, frozen, or canned fruit, or  cup of 100% fruit juice.  Eat more foods that contain soluble fiber. Examples of foods that contain this type of fiber are apples, broccoli, carrots, beans, peas, and barley. Aim to get 20-30 g of fiber per day.  Eat more home-cooked food and less restaurant, buffet, and fast food.  Limit or avoid alcohol.  Limit foods that are high in starch and sugar.  Avoid fried foods.  Cook foods by using methods other than frying. Baking, boiling, grilling, and broiling are all great options. Other fat-reducing suggestions include:  Removing the skin from poultry.  Removing all visible fats from meats.  Skimming the fat off of stews, soups, and gravies before serving them.  Steaming vegetables in water or broth.  Lose weight if you are overweight. Losing just 5-10% of your initial body weight can help your overall health and prevent diseases such as diabetes and heart disease.  Increase your consumption of nuts, legumes, and seeds to 4-5 servings per week. One serving of  dried beans or legumes equals  cup after being cooked, one serving of nuts equals 1 ounces, and one serving of seeds equals  ounce or 1 tablespoon.  You may need to monitor your salt (sodium) intake, especially if you have high blood pressure. Talk with your health care provider or dietitian to get more information about reducing sodium. WHAT FOODS CAN I  EAT? Grains Breads, including [place], white, pita, wheat, raisin, rye, oatmeal, and [place]. Tortillas that are neither fried nor made with lard or trans fat. Low-fat rolls, including hotdog and hamburger buns and English muffins. Biscuits. Muffins. Waffles. Pancakes. Light popcorn. Whole-grain cereals. Flatbread. Melba toast. Pretzels. Breadsticks. Rusks. Low-fat snacks and crackers, including oyster, saltine, matzo, graham, animal, and rye. Rice and pasta, including brown rice and those that are made with whole wheat. Vegetables All vegetables. Fruits All fruits, but limit coconut. Meats and Other Protein Sources Lean, well-trimmed beef, veal, pork, and lamb. Chicken and [place] without skin. All fish and shellfish. Wild duck, rabbit, pheasant, and venison. Egg whites or low-cholesterol egg substitutes. Dried beans, peas, lentils, and tofu.Seeds and most nuts. Dairy Low-fat or nonfat cheeses, including ricotta, string, and mozzarella. Skim or 1% milk that is liquid, powdered, or evaporated. Buttermilk that is made with low-fat milk. Nonfat or low-fat yogurt. Beverages Mineral water. Diet carbonated beverages. Sweets and Desserts Sherbets and fruit ices. Honey, jam, marmalade, jelly, and syrups. Meringues and gelatins. Pure sugar candy, such as hard candy, jelly beans, gumdrops, mints, marshmallows, and small amounts of dark chocolate. [employer] Eat all sweets and desserts in moderation. Fats and Oils Nonhydrogenated (trans-free) margarines. Vegetable oils, including soybean, sesame, sunflower, olive, peanut, safflower, corn, canola, and cottonseed. Salad dressings or mayonnaise that are made with a vegetable oil. Limit added fats and oils that you use for cooking, baking, salads, and as spreads. Other Cocoa powder. Coffee and tea. All seasonings and condiments. The items listed above may not be a complete list of recommended foods or beverages. Contact your dietitian for more  options. WHAT FOODS ARE NOT RECOMMENDED? Grains Breads that are made with saturated or trans fats, oils, or whole milk. Croissants. Butter rolls. Cheese breads. Sweet rolls. Donuts. Buttered popcorn. Chow mein noodles. High-fat crackers, such as cheese or butter crackers. Meats and Other Protein Sources Fatty meats, such as hotdogs, short ribs, sausage, spareribs, bacon, ribeye roast or steak, and mutton. High-fat deli meats, such as salami and bologna. Caviar. Domestic duck and goose. Organ meats, such as kidney, liver, sweetbreads, brains, gizzard, chitterlings, and heart. Dairy Cream, sour cream, cream cheese, and creamed cottage cheese. Whole milk cheeses, including blue (bleu), Monte[place] Jack, [place], [place], American, [place], [place], [place], [place], and [place]. Whole or 2% milk that is liquid, evaporated, or condensed. Whole buttermilk. Cream sauce or high-fat cheese sauce. Yogurt that is made from whole milk. Beverages Regular sodas and drinks with added sugar. Sweets and Desserts Frosting. Pudding. Cookies. Cakes other than angel food cake. Candy that has milk chocolate or white chocolate, hydrogenated fat, butter, coconut, or unknown ingredients. Buttered syrups. Full-fat ice cream or ice cream drinks. Fats and Oils Gravy that has suet, meat fat, or shortening. Cocoa butter, hydrogenated oils, palm oil, coconut oil, palm kernel oil. These can often be found in baked products, candy, fried foods, nondairy creamers, and whipped toppings. Solid fats and shortenings, including bacon fat, salt pork, lard, and butter. Nondairy cream substitutes, such as coffee creamers and sour cream substitutes. Salad dressings that are made of unknown oils,  cheese, or sour cream. The items listed above may not be a complete list of foods and beverages to avoid. Contact your dietitian for more information.   This information is not intended to replace advice given to you by your health care provider. Make sure  you discuss any questions you have with your health care provider.   Document Released: 03/21/2008 Document Revised: 07/03/2014 Document Reviewed: 12/04/2013 Elsevier Interactive Patient Education [employer].

## 2015-12-10 NOTE — Progress Notes (Signed)
Subjective: Pt denies any current chest pain. He has no complaints. When asked about his chest pain symptoms that brought him in to the hospital he reports that he was unable to take in a deep breath due to chest pain. However per admitting team he did not have any pleuritic chest pain.   Objective: Vital signs in last 24 hours: Filed Vitals:   12/09/15 1345 12/09/15 2038 12/10/15 0500 12/10/15 0813  BP: 135/86 136/82 125/78 154/108  Pulse: 69 64 72   Temp: 97.8 F (36.6 C) 98.4 F (36.9 C) 97.9 F (36.6 C)   TempSrc: Oral Oral Oral   Resp: 18 18 18    Height:      Weight:   180 lb (81.647 kg)   SpO2: 96% 94% 93%    Weight change: -3 lb 14.4 oz (-1.769 kg)  Intake/Output Summary (Last 24 hours) at 12/10/15 1024 Last data filed at 12/10/15 0400  Gross per 24 hour  Intake      3 ml  Output   1100 ml  Net  -1097 ml   General: NAD, laying in bed comfortably Lungs: CTAB, no wheezing Cardiac: RRR, no murmurs, gallops, or rubs,  GI: soft, active bowel sounds, non TTP Neuro: CN II-XII grossly intact Skin: warm and dry Ext: b/l ulnar deviation, swan neck deformities in 1st and 2nd digits b/l, and 1st and 2nd hebereden's nodes b/l  Lab Results: Basic Metabolic Panel:  Recent Labs Lab 12/08/15 1551 12/09/15 0732  NA 140 141  K 4.4 4.4  CL 106 104  CO2 28 30  GLUCOSE 122* 93  BUN 11 9  CREATININE 1.43* 0.92  CALCIUM 9.5 9.0   Liver Function Tests:  Recent Labs Lab 12/08/15 2045  AST 149*  ALT 212*  ALKPHOS 105  BILITOT 0.6  PROT 6.4*  ALBUMIN 3.4*   CBC:  Recent Labs Lab 12/08/15 1551  WBC 5.8  HGB 13.4  HCT 42.1  MCV 89.4  PLT 156   Cardiac Enzymes:  Recent Labs Lab 12/08/15 2045 12/09/15 0230 12/09/15 0732  TROPONINI <0.03 <0.03 <0.03   Thyroid Function Tests:  Recent Labs Lab 12/09/15 0230  TSH 0.730   Urine Drug Screen: Drugs of Abuse     Component Value Date/Time   LABOPIA POSITIVE* 12/08/2015 2049   LABOPIA NEG 08/30/2010  1653   COCAINSCRNUR NONE DETECTED 12/08/2015 2049   COCAINSCRNUR NEG 08/30/2010 1653   LABBENZ POSITIVE* 12/08/2015 2049   LABBENZ POS* 08/30/2010 1653   AMPHETMU NONE DETECTED 12/08/2015 2049   AMPHETMU NEG 08/30/2010 1653   THCU NONE DETECTED 12/08/2015 2049   LABBARB NONE DETECTED 12/08/2015 2049    Urinalysis:  Recent Labs Lab 12/08/15 2049  COLORURINE YELLOW  LABSPEC 1.015  PHURINE 6.0  GLUCOSEU NEGATIVE  HGBUR NEGATIVE  BILIRUBINUR NEGATIVE  KETONESUR NEGATIVE  PROTEINUR NEGATIVE  NITRITE NEGATIVE  LEUKOCYTESUR NEGATIVE   Studies/Results: Dg Chest 2 View  12/08/2015  CLINICAL DATA:  Chest pain and shortness of breath today, smoker, history lupus, hypertension EXAM: CHEST  2 VIEW COMPARISON:  09/25/2012 FINDINGS: Enlargement of cardiac silhouette. Calcified and tortuous thoracic aorta. Minimal atelectasis at base of lingula. Minimal chronic peribronchial thickening. No pulmonary infiltrate, pleural effusion, or pneumothorax. Bones demineralized. IMPRESSION: Enlargement of cardiac silhouette with minimal lingular atelectasis. Electronically Signed   By: Lavonia Dana M.D.   On: 12/08/2015 16:40   Ct Head Wo Contrast  12/08/2015  CLINICAL DATA:  Dizziness with altered gait today. History of hypertension. Initial encounter. EXAM:  CT HEAD WITHOUT CONTRAST TECHNIQUE: Contiguous axial images were obtained from the base of the skull through the vertex without intravenous contrast. COMPARISON:  Head CT 04/09/2000 FINDINGS: Brain: There is no evidence of acute intracranial hemorrhage, mass lesion, brain edema or extra-axial fluid collection. The ventricles and subarachnoid spaces are appropriately sized for age. There is no CT evidence of acute cortical infarction. Bones/sinuses/visualized face: There is a stable small mucous retention cyst or polyp in the right maxillary sinus. The visualized paranasal sinuses, mastoid air cells and middle ears are otherwise clear. The calvarium is intact.  IMPRESSION: Stable head CT.  No acute intracranial findings. Electronically Signed   By: Richardean Sale M.D.   On: 12/08/2015 19:12   Ct Coronary Morph W/cta Cor W/score W/ca W/cm &/or Wo/cm  12/09/2015  ADDENDUM REPORT: 12/09/2015 17:34 CLINICAL DATA:  55 year old male with chest pain EXAM: Cardiac/Coronary  CT TECHNIQUE: The patient was scanned on a Philips 256 scanner. FINDINGS: A 120 kV prospective scan was triggered in the descending thoracic aorta at 111 HU's. Axial non-contrast 3 mm slices were carried out through the heart. The data set was analyzed on a dedicated work station and scored using the [place]. Gantry rotation speed was 270 msecs and collimation was .9 mm. 50 mg of PO metoprolol and 0.8 mg of sl NTG was given. The 3D data set was reconstructed in 5% intervals of the 67-82 % of the R-R cycle. Diastolic phases were analyzed on a dedicated work station using MPR, MIP and VRT modes. The patient received 80 cc of contrast. Aorta:  Normal size, mild calcifications in the aortic arch. Aortic Valve: Trileaflet. There is a focal thickening and calcification of the right coronary cusp measuring 9 x 7 mm. Coronary Arteries:  Normal origin.  Right and left codominance. Left main is a large artery that has minimal calcified plaque in its distal segment with associated stenosis 0-25%. LAD is a large artery that gives rise to two diagonal branches and has no significant plaque. There is an intramyocardial bridge in the mid to distal LAD. LCX artery is a medium caliber vessel that gives rise to two obtuse marginal branches and a PLA. There is no plaque. RCA is a small caliber vessel that gives rise to an acute marginal branch and PDA. There is mild calcified plaque associated with 25-50% stenosis at the mid RCA at the takeoff of the acute marginal branch. There is also a long non-calcified narrowing of the mid RCA associated with 25-50% stenosis. Other findings: Mildly dilated pulmonary artery  suggestive of pulmonary hypertension. Normal pulmonary vein drainage into the left atrium. No ASD/VSD. No lefta atrial appendage thrombus. IMPRESSION: 1. Coronary calcium score of 25. This was 59 percentile for age and sex matched control. 2.  Normal coronary origin with right and left co-dominance. 3. Mild to moderate non-obstructive plaque in the RCA. An aggressive risk factor modification is recommended. 4.  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 5. Mildly dilated pulmonary artery suggestive of pulmonary hypertension. Ena Dawley Electronically Signed   By: Ena Dawley   On: 12/09/2015 17:34  12/09/2015  EXAM: OVER-READ INTERPRETATION  CT CHEST The following report is an over-read performed by radiologist Dr. Rebekah Chesterfield [facility] on 12/09/2015. This over-read does not include interpretation of cardiac or coronary anatomy or pathology. The coronary calcium score/coronary CTA interpretation by the cardiologist is attached. COMPARISON:  Chest CT 11/18/2011. FINDINGS: 6 mm ground-glass attenuation nodule in the left upper lobe (  image 9 of series 204). Within the visualized portions of the thorax there are no larger more suspicious appearing pulmonary nodules or masses, there is no acute consolidative airspace disease, no pleural effusions, no pneumothorax and no lymphadenopathy. Scattered areas of mild linear scarring are noted throughout the visualized lung bases. Diffuse low attenuation throughout the visualized hepatic parenchyma, compatible with hepatic steatosis. There are no aggressive appearing lytic or blastic lesions noted in the visualized portions of the skeleton. IMPRESSION: 1. 6 mm ground-glass attenuation nodule in the left upper lobe. Initial follow-up with CT at 6-12 months is recommended to confirm persistence. If persistent, repeat CT is recommended every 2 years until 5 years of stability has been established. This recommendation follows the  consensus statement: Guidelines for Management of Incidental Pulmonary Nodules Detected on CT Images:From the Fleischner Society 2017; published online before print (10.1148/radiol.IJ:2314499). 2. Hepatic steatosis. Electronically Signed: By: Vinnie Langton M.D. On: 12/09/2015 17:09   Medications: I have reviewed the patient's current medications. Scheduled Meds: . amLODipine  5 mg Oral Daily  . aspirin EC  81 mg Oral Daily  . heparin  5,000 Units Subcutaneous Q8H  . pantoprazole  40 mg Oral Daily  . sodium chloride flush  3 mL Intravenous Q12H   Continuous Infusions:   PRN Meds:.nitroGLYCERIN, ondansetron, oxyCODONE **AND** oxyCODONE-acetaminophen Assessment/Plan: Principal Problem:   Chest pain Active Problems:   HLD (hyperlipidemia)   Systemic lupus erythematosus (HCC)   Rheumatoid arthritis (HCC)   HTN (hypertension)   Lightheadedness   AKI (acute kidney injury) (HCC)   Sinus bradycardia   Chest pain: troponins negative x 3, no longer having chest pain. He has typical anginal chest pain sx although Infliximab has a 1% chance of causing chest pain. He did not initally report pleuritic chest pain on admission but on further questioning he may have had pleurtic chest pain. Thus possibility of pleurisy from lupus -aspirin 81mg  daily - held off on starting high intensity statin due to elevated LFTs. - cardiology following,Coronary calcium score 25, recommended bystolic 5mg  and norvasc 5mg  on discharge, follow up with cardiology clinic will be made by their office.  - obtaining records from rheumatology office today, checking anti-ds DNA, ANA  Calcified aortic valve mass-- ECHO 2013 revealed 7.5 mmx7.45mm calcified mass in the rt coronary cusp which may be attached to the valve leaflet or in the aorta above the valve, possibility of it being aortic calcification, calcified thrombus, vegetation, or liebman-sacks endocarditis. Reccommended f/u with TEE, unclear why this was never  followed up.   - TTE reveals increase in aortic mass but no recommendations on anticoagulation until he has sx of embolism.   Transaminitis-- AST 149 and ALT 212, previously nl in 2013. He was started on infliximab recently which has a 2-10% chance of causing increased serum ALT ?3x ULN. Consider stopping infliximab on discharge. CT chest revealed evidence of hepatic steatosis which can cause a 2-5x increase in ULN of AST and ALT. HIV negative. Hep B surface antibody negative, reports never receiving hep b vaccination.  - repeat LFTs today - checking hep b core antibody and hep a antibody.   Hypertension: continue bystolic and norvasc.   Rheumatoid arthritis: obtaining records from rheumatology office, will recommend stopping infliximab due to LFT abnormalities.   Tobacco abuse w/ abn CT chest findings-- pt is a current everyday smoker and in 2013 had a chest CT for rt pleuritic chest pain that found indeterminate b/l pulmonary nodules w/ largest measuring 22mm in the left  upper lobe.  - CT chest yesterday revealed 6 mm ground-glass attenuation nodule in the left upper lobe. Initial follow-up with CT at 6-12 months is recommended to confirmpersistence. If persistent, repeat CT is recommended every 2 years until 5 years of stability has been established.   Dispo: Possible d/c home today.   The patient does have a current PCP Collier Salina, MD) and does need an [facility] hospital follow-up appointment after discharge.  The patient does not know have transportation limitations that hinder transportation to clinic appointments .Services Needed at time of discharge: Y = Yes, Blank = No PT:   OT:   RN:   Equipment:   Other:       Norman Herrlich, MD 12/10/2015, 10:24 AM

## 2015-12-11 LAB — HEPATITIS A ANTIBODY, TOTAL: Hep A Total Ab: NEGATIVE

## 2015-12-11 LAB — HEPATITIS B CORE ANTIBODY, TOTAL: Hep B Core Total Ab: NEGATIVE

## 2015-12-11 LAB — ANTI-DNA ANTIBODY, DOUBLE-STRANDED: ds DNA Ab: 1 IU/mL (ref 0–9)

## 2015-12-13 LAB — ANTINUCLEAR ANTIBODIES, IFA: ANA Ab, IFA: NEGATIVE

## 2015-12-14 DIAGNOSIS — M0579 Rheumatoid arthritis with rheumatoid factor of multiple sites without organ or systems involvement: Secondary | ICD-10-CM | POA: Diagnosis not present

## 2015-12-14 DIAGNOSIS — I10 Essential (primary) hypertension: Secondary | ICD-10-CM | POA: Diagnosis not present

## 2015-12-16 ENCOUNTER — Ambulatory Visit (INDEPENDENT_AMBULATORY_CARE_PROVIDER_SITE_OTHER): Payer: Medicare Other | Admitting: Internal Medicine

## 2015-12-16 ENCOUNTER — Encounter: Payer: Self-pay | Admitting: Internal Medicine

## 2015-12-16 VITALS — BP 134/87 | HR 90 | Temp 98.0°F | Ht 68.0 in | Wt 179.3 lb

## 2015-12-16 DIAGNOSIS — R7401 Elevation of levels of liver transaminase levels: Secondary | ICD-10-CM | POA: Insufficient documentation

## 2015-12-16 DIAGNOSIS — M25512 Pain in left shoulder: Secondary | ICD-10-CM | POA: Diagnosis not present

## 2015-12-16 DIAGNOSIS — R740 Nonspecific elevation of levels of transaminase and lactic acid dehydrogenase [LDH]: Secondary | ICD-10-CM

## 2015-12-16 DIAGNOSIS — M0579 Rheumatoid arthritis with rheumatoid factor of multiple sites without organ or systems involvement: Secondary | ICD-10-CM

## 2015-12-16 DIAGNOSIS — M069 Rheumatoid arthritis, unspecified: Secondary | ICD-10-CM

## 2015-12-16 HISTORY — DX: Elevation of levels of liver transaminase levels: R74.01

## 2015-12-16 HISTORY — DX: Pain in left shoulder: M25.512

## 2015-12-16 NOTE — Progress Notes (Signed)
[facility] Subjective:   Patient ID: [name] [name] male   DOB: September 05, 1960 55 y.o.   MRN: [id number] CC: Left shoulder pain HPI: [name] is a 55 y.o. male with a PMH detailed below who presents for hospital follow up. He was recently hospitalized for chest pain.  Cardiac workup was unremarkable and his chest pain was attributed to an adverse reaction to his recent initiation of inflixamab.  He reports that his chest pain has continued to improve since discharge but he is having left shoulder pain.  He reports his left shoulder pain does radiate to his chest.  He has a history of a rotator cuff tear which was treated arthroscopically as well as impingement syndrome of his left shoulder by Dr [name] [name], due to his recent pain he would like to see Dr [name] [name] again  He is very frustrated by his new rheumologist, he previously liked Dr [name] [name] very much and does not feel that his new doctor is listening to him and wants to give him infliximab again.  Past Medical History  Diagnosis Date  . Lupus ([place])   . History of anemia     no current med.  [name] GERD (gastroesophageal reflux disease)   . Hypertension     states is borderline on medication; has been on med. x 5-6 yr.  . Impingement syndrome of shoulder region 10/2014    left  . RA (rheumatoid arthritis) ([place])   . Heart murmur   . Pneumonia   . Anemia    Current Outpatient Prescriptions  Medication Sig Dispense Refill  . amLODipine (NORVASC) 5 MG tablet Take 1 tablet (5 mg total) by mouth daily. 30 tablet 0  . aspirin EC 81 MG EC tablet Take 1 tablet (81 mg total) by mouth daily. 30 tablet 11  . baclofen (LIORESAL) 10 MG tablet Take 10 mg by mouth 2 (two) times daily as needed for muscle spasms.   2  . BYSTOLIC 5 MG tablet TAKE 1 TABLET BY MOUTH DAILY. 90 tablet 1  . cetirizine (ZYRTEC) 10 MG tablet Take 10 mg by mouth daily as needed (takes with infusions).    . ferrous sulfate 325 (65 FE) MG tablet Take 325 mg by  mouth daily with breakfast.    . nitroGLYCERIN (NITROLINGUAL) 0.4 MG/SPRAY spray Place 1 spray under the tongue every 5 (five) minutes x 3 doses as needed for chest pain. 12 g 12  . ondansetron (ZOFRAN) 4 MG tablet Take 1 tablet (4 mg total) by mouth every 8 (eight) hours as needed for nausea or vomiting. 40 tablet 0  . oxyCODONE-acetaminophen (PERCOCET) 10-325 MG tablet Take 1 tablet by mouth daily as needed.    [name] oxyCODONE-acetaminophen (ROXICET) 5-325 MG per tablet Take 1-2 tablets by mouth every 4 (four) hours as needed. 60 tablet 0  . pantoprazole (PROTONIX) 40 MG tablet TAKE 1 TABLET (40 MG TOTAL) BY MOUTH 2 (TWO) TIMES DAILY. 60 tablet 2  . zolpidem (AMBIEN CR) 6.25 MG CR tablet TAKE 1 TABLET BY MOUTH AT BEDTIME AS NEEDED FOR SLEEP 30 tablet 0   No current facility-administered medications for this visit.   Family History  Problem Relation Age of Onset  . Stroke Neg Hx   . Cancer Neg Hx   . Heart disease Father 36   Social History   Social History  . Marital Status: Single    Spouse Name: N/A  . Number of Children: N/A  . Years of Education: N/A  Social History Main Topics  . Smoking status: Current Every Day Smoker -- 0.40 packs/day for 20 years    Types: Cigars, Cigarettes  . Smokeless tobacco: Never Used     Comment: 5-6 per cig./day  . Alcohol Use: No  . Drug Use: No  . Sexual Activity: Not Asked   Other Topics Concern  . None   Social History Narrative   Patient lives by himself, is on disability since 1992, used to work with city of [place]. 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 Smokes 1/2 PPD and has been smoking since 25 years. Quit drinking in 1990.   Review of Systems: Review of Systems  Constitutional: Negative for fever.  Respiratory: Negative for cough and shortness of breath.   Cardiovascular: Positive for chest pain.  Musculoskeletal: Positive for joint pain. Negative for falls.     Objective:  Physical Exam: Filed Vitals:   12/16/15 0857  BP: 134/87   Pulse: 90  Temp: 98 F (36.7 C)  TempSrc: Oral  Height: 5\' 8"  (1.727 m)  Weight: 179 lb 4.8 oz (81.33 kg)  SpO2: 96%  Physical Exam  Cardiovascular: Normal rate and regular rhythm.   Pulmonary/Chest: Effort normal and breath sounds normal.  Musculoskeletal:  Left shoulder Neg empty can, positive neers, mildly global reduction in ROM  Nursing note and vitals reviewed.   Assessment & Plan:  Case discussed with Dr. Beryle [name]  Rheumatoid arthritis [facility]) -He would like to return to seeing Dr [name] [name], will refer him over to [facility] Rheumatology  Left shoulder pain -Likely impingement syndrome, will refer him back to Dr [name] [name]  Transaminitis Has had intermittent elevations over the last few years (previouosly on MTX), and was improving on discharge.  His viral hepatitis testing was negative,  It is possible that this is another adverse reaction to Infliximab.  Will repeat LFTs today.    Medications Ordered No orders of the defined types were placed in this encounter.   Other Orders Orders Placed This Encounter  Procedures  . Liver Profile  . Ambulatory referral to Orthopedic Surgery    Referral Priority:  Routine    Referral Type:  Surgical    Referral Reason:  Specialty Services Required    Requested Specialty:  Orthopedic Surgery    Number of Visits Requested:  1  . Ambulatory referral to Rheumatology    Referral Priority:  Routine    Referral Type:  Consultation    Referral Reason:  Specialty Services Required    Requested Specialty:  Rheumatology    Number of Visits Requested:  1   Follow Up: Return in about 2 months (around 02/15/2016).

## 2015-12-16 NOTE — Progress Notes (Signed)
Medicine attending: Medical history, presenting problems, physical findings, and medications, reviewed with resident physician Dr Erik Hoffman on the day of the patient visit and I concur with his evaluation and management plan. 

## 2015-12-16 NOTE — Assessment & Plan Note (Signed)
-  He would like to return to seeing Dr Lenna Gilford, will refer him over to [facility]ology

## 2015-12-16 NOTE — Patient Instructions (Signed)
General Instructions:  I have placed a referral to your orthopaedic surgeon for reevaluation of your left shoulder.   Please bring your medicines with you each time you come to clinic.  Medicines may include prescription medications, over-the-counter medications, herbal remedies, eye drops, vitamins, or other pills.   Progress Toward Treatment Goals:  Treatment Goal 05/11/2014  Blood pressure deteriorated    Self Care Goals & Plans:  Self Care Goal 12/16/2015  Manage my medications take my medicines as prescribed; bring my medications to every visit; refill my medications on time  Monitor my health keep track of my weight  Eat healthy foods drink diet soda or water instead of juice or soda; eat more vegetables; eat foods that are low in salt; eat baked foods instead of fried foods; eat fruit for snacks and desserts  Be physically active take a walk every day; find an activity I enjoy  Meeting treatment goals maintain the current self-care plan    No flowsheet data found.   Care Management & Community Referrals:  No flowsheet data found.

## 2015-12-16 NOTE — Assessment & Plan Note (Signed)
Has had intermittent elevations over the last few years (previouosly on MTX), and was improving on discharge.  His viral hepatitis testing was negative,  It is possible that this is another adverse reaction to Infliximab.  Will repeat LFTs today.

## 2015-12-16 NOTE — Assessment & Plan Note (Signed)
-  Likely impingement syndrome, will refer him back to Dr Percell Miller

## 2015-12-17 LAB — HEPATIC FUNCTION PANEL
ALT: 38 IU/L (ref 0–44)
AST: 17 IU/L (ref 0–40)
Albumin: 4.2 g/dL (ref 3.5–5.5)
Alkaline Phosphatase: 100 IU/L (ref 39–117)
BILIRUBIN TOTAL: 0.4 mg/dL (ref 0.0–1.2)
Bilirubin, Direct: 0.1 mg/dL (ref 0.00–0.40)
Total Protein: 7.1 g/dL (ref 6.0–8.5)

## 2015-12-21 ENCOUNTER — Encounter: Payer: Self-pay | Admitting: Internal Medicine

## 2015-12-23 ENCOUNTER — Encounter: Payer: Self-pay | Admitting: Physician Assistant

## 2015-12-27 ENCOUNTER — Other Ambulatory Visit (HOSPITAL_COMMUNITY): Payer: Self-pay | Admitting: *Deleted

## 2015-12-29 ENCOUNTER — Encounter (HOSPITAL_COMMUNITY): Payer: Medicare Other

## 2015-12-30 ENCOUNTER — Ambulatory Visit (INDEPENDENT_AMBULATORY_CARE_PROVIDER_SITE_OTHER): Payer: Medicare Other | Admitting: Physician Assistant

## 2015-12-30 ENCOUNTER — Encounter: Payer: Self-pay | Admitting: Physician Assistant

## 2015-12-30 ENCOUNTER — Encounter (INDEPENDENT_AMBULATORY_CARE_PROVIDER_SITE_OTHER): Payer: Self-pay

## 2015-12-30 VITALS — BP 126/90 | HR 92 | Ht 68.0 in | Wt 178.9 lb

## 2015-12-30 DIAGNOSIS — E785 Hyperlipidemia, unspecified: Secondary | ICD-10-CM

## 2015-12-30 DIAGNOSIS — R079 Chest pain, unspecified: Secondary | ICD-10-CM | POA: Diagnosis not present

## 2015-12-30 DIAGNOSIS — I358 Other nonrheumatic aortic valve disorders: Secondary | ICD-10-CM | POA: Insufficient documentation

## 2015-12-30 DIAGNOSIS — I359 Nonrheumatic aortic valve disorder, unspecified: Secondary | ICD-10-CM | POA: Diagnosis not present

## 2015-12-30 DIAGNOSIS — I10 Essential (primary) hypertension: Secondary | ICD-10-CM

## 2015-12-30 HISTORY — DX: Other nonrheumatic aortic valve disorders: I35.8

## 2015-12-30 NOTE — Patient Instructions (Addendum)
Medication Instructions:  Your physician recommends that you continue on your current medications as directed. Please refer to the Current Medication list given to you today.   Labwork:  FASTING  LIPID IN  A WEEK OR  TWO AT YOUR  CONVENIENCE  Testing/Procedures:  Follow-Up: Your physician recommends that you schedule a follow-up appointment in: 4 MONTHS  WITH DR  Meda Coffee  Any Other Special Instructions Will Be Listed Below (If Applicable).     If you need a refill on your cardiac medications before your next appointment, please call your pharmacy.

## 2015-12-30 NOTE — Progress Notes (Signed)
Cardiology Office Note    Date:  12/30/2015   ID:  [name] [name], DOB 05-19-1961, MRN [id number]  PCP:  Gilles Chiquito, MD  Cardiologist: Dr. Meda Coffee    History of Present Illness:  [name] [name] is a 55 y.o. male  with prior medical history with lupus and rheumatoid arthritis with involvement of his metacarpal joints and chronic pain, ongoing tobacco abuse and premature coronary artery disease in his father was also a smoker, who previously had an echocardiogram done in 2013 which finding of aortic valve modest measuring 77 mm suspicious of Libman-Sacks endocarditis. Patient  denies any symptoms of stroke. He has been started on infliximab for his rheumatologic problems and has developed significant chest pain which shortness of breath and diaphoresis during infliximab infusions but otherwise not while he's walking or exerting himself otherwise.RDS enzymes were negative, EKG showed sinus bradycardia with nonspecific ST changes, blood pressure was elevated and he has moderate concentric LVH so amlodipine 5 mg was added. With regards to his aortic valve mass that has only minimally enlarged in the last 4 years, with associated mild-to-moderate aortic regurgitation, and this point I would not proceed with any further management unless patient has symptoms of peripheral embolism. CTA coronary calcium score was 25 with mild to moderate nonobstructive plaque in the RCA aggressive risk factor modification recommended. Mildly dilated pulmonary artery suggestive of pulmonary hypertension. No further workup needed.  Patient comes in today feeling better. He says he thinks his left shoulder pain is causing all his issues. He is looking for a new rheumatologist. He denies any exertional chest pain. He says he has a numbness in his throat that he notices when he sits down. He is trying to quit smoking. He is down to 3 cigarettes a day from 1-1/2 packs daily. He hasn't had his cholesterol checked in a long time but  his LFTs have been elevated.   Past Medical History  Diagnosis Date  . Lupus ([place])   . History of anemia     no current med.  [name] GERD (gastroesophageal reflux disease)   . Hypertension     states is borderline on medication; has been on med. x 5-6 yr.  . Impingement syndrome of shoulder region 10/2014    left  . RA (rheumatoid arthritis) ([place])   . Heart murmur   . Pneumonia   . Anemia     Past Surgical History  Procedure Laterality Date  . Video assisted thoracoscopy (vats)/decortication Right 11/21/2011    drainage of empyema  . Achilles tendon surgery Bilateral   . Osteotomy and ulnar shortening Right 07/22/2002  . Shoulder arthroscopy with rotator cuff repair and subacromial decompression Left 11/19/2014    Procedure: LEFT SHOULDER ARTHROSCOPY, DEBRIDEMENT DISTAL CLAVICLE EXCISION, ACROMIOPLASTY WITH ROTATOR CUFF REPAIR ;  Surgeon: Kathryne Hitch, MD;  Location: [facility];  Service: Orthopedics;  Laterality: Left;  . Shoulder arthroscopy with distal clavicle resection Left 11/19/2014    Procedure: SHOULDER ARTHROSCOPY WITH DISTAL CLAVICLE RESECTION;  Surgeon: Kathryne Hitch, MD;  Location: [facility];  Service: Orthopedics;  Laterality: Left;  . Shoulder arthroscopy with bicepstenotomy Right 03/11/2015    Procedure: SHOULDER ARTHROSCOPY WITH BICEPSTENOTOMY;  Surgeon: Ninetta Lights, MD;  Location: [facility];  Service: Orthopedics;  Laterality: Right;    Current Medications: Outpatient Prescriptions Prior to Visit  Medication Sig Dispense Refill  . amLODipine (NORVASC) 5 MG tablet Take 1 tablet (5 mg total) by mouth daily. 30 tablet 0  .  aspirin EC 81 MG EC tablet Take 1 tablet (81 mg total) by mouth daily. 30 tablet 11  . baclofen (LIORESAL) 10 MG tablet Take 10 mg by mouth 2 (two) times daily as needed for muscle spasms.   2  . BYSTOLIC 5 MG tablet TAKE 1 TABLET BY MOUTH DAILY. 90 tablet 1  . ferrous sulfate 325 (65 FE) MG tablet Take  325 mg by mouth daily with breakfast.    . nitroGLYCERIN (NITROLINGUAL) 0.4 MG/SPRAY spray Place 1 spray under the tongue every 5 (five) minutes x 3 doses as needed for chest pain. 12 g 12  . ondansetron (ZOFRAN) 4 MG tablet Take 1 tablet (4 mg total) by mouth every 8 (eight) hours as needed for nausea or vomiting. 40 tablet 0  . oxyCODONE-acetaminophen (PERCOCET) 10-325 MG tablet Take 1 tablet by mouth daily as needed.    . pantoprazole (PROTONIX) 40 MG tablet TAKE 1 TABLET (40 MG TOTAL) BY MOUTH 2 (TWO) TIMES DAILY. 60 tablet 2  . cetirizine (ZYRTEC) 10 MG tablet Take 10 mg by mouth daily as needed (takes with infusions).    [name] oxyCODONE-acetaminophen (ROXICET) 5-325 MG per tablet Take 1-2 tablets by mouth every 4 (four) hours as needed. 60 tablet 0  . zolpidem (AMBIEN CR) 6.25 MG CR tablet TAKE 1 TABLET BY MOUTH AT BEDTIME AS NEEDED FOR SLEEP 30 tablet 0   No facility-administered medications prior to visit.     Allergies:   Ramipril and Tramadol   Social History   Social History  . Marital Status: Single    Spouse Name: N/A  . Number of Children: N/A  . Years of Education: N/A   Social History Main Topics  . Smoking status: Current Every Day Smoker -- 0.40 packs/day for 20 years    Types: Cigars, Cigarettes  . Smokeless tobacco: Never Used     Comment: 5-6 per cig./day  . Alcohol Use: No  . Drug Use: No  . Sexual Activity: Not Asked   Other Topics Concern  . None   Social History Narrative   Patient lives by himself, is on disability since 1992, used to work with city [employer] [place]. 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 Smokes 1/2 PPD and has been smoking since 25 years. Quit drinking in 1990.     Family History:  The patient's family history includes Heart disease (age of onset: 43) in his father. There is no history of Stroke or Cancer.   ROS:   Please see the history of present illness.    Review of Systems  Respiratory: Positive for sleep disturbances due to breathing.     Hematologic/Lymphatic: Bruises/bleeds easily.  Musculoskeletal: Positive for back pain, joint swelling and myalgias.   All other systems reviewed and are negative.   PHYSICAL EXAM:   VS:  BP 126/90 mmHg  Pulse 92  Ht 5\' 8"  (1.727 m)  Wt 178 lb 14.4 oz (81.149 kg)  BMI 27.21 kg/m2  Physical Exam  GEN: Well nourished, well developed, in no acute distress HEENT: normal Neck: no JVD, carotid bruits, or masses Cardiac:RRR; no murmurs, rubs, or gallops  Respiratory:  clear to auscultation bilaterally, normal work of breathing GI: soft, nontender, nondistended, + BS Ext: without cyanosis, clubbing, or edema, Good distal pulses bilaterally MS: no deformity or atrophy Skin: warm and dry, no rash Neuro:  Alert and Oriented x 3, Strength and sensation are intact Psych: euthymic mood, full affect  Wt Readings from Last 3 Encounters:  12/30/15 178 lb 14.4  oz (81.149 kg)  12/16/15 179 lb 4.8 oz (81.33 kg)  12/10/15 180 lb (81.647 kg)      Studies/Labs Reviewed:   EKG:  EKG is not ordered today.   Recent Labs: 12/08/2015: Hemoglobin 13.4; Platelets 156 12/09/2015: BUN 9; Creatinine, Ser 0.92; Potassium 4.4; Sodium 141; TSH 0.730 12/16/2015: ALT 38   Lipid Panel    Component Value Date/Time   CHOL 151 04/08/2012 0942   TRIG 269* 04/08/2012 0942   HDL 24* 04/08/2012 0942   CHOLHDL 6.3 04/08/2012 0942   VLDL 54* 04/08/2012 0942   LDLCALC 73 04/08/2012 0942    Additional studies/ records that were reviewed today include:   Echo: 12/09/2015 LV EF: 60% -   65% Study Conclusions - Left ventricle: The cavity size was normal. There was moderate   concentric hypertrophy. Systolic function was normal. The   estimated ejection fraction was in the range of 60% to 65%. Wall   motion was normal; there were no regional wall motion   abnormalities. Doppler parameters are consistent with abnormal   left ventricular relaxation (grade 1 diastolic dysfunction).   Doppler parameters are  consistent with indeterminate ventricular   filling pressure. - Aortic valve: There was mild to moderate regurgitation. The   hyperechoic lesion on the R coronary cusp of the aortic valve is   1.0 cm x 0.87 cm. Slightly larger than in 10/2011 (7.5 cm x 7.5   cm) Regurgitation pressure half-time: 244 ms. Aortic valve   regurgitation appears mild visually, but is moderate by pressure   half-time. - Mitral valve: Transvalvular velocity was within the normal range.   There was no evidence for stenosis. There was no regurgitation. - Right ventricle: The cavity size was normal. Wall thickness was   normal. Systolic function was normal. - Atrial septum: No defect or patent foramen ovale was identified   by color flow Doppler. - Tricuspid valve: There was trivial regurgitation. - Pulmonary arteries: Systolic pressure was within the normal   range. PA peak pressure: 28 mm Hg (S).   Filed: 12/09/2015  6:24 PM Note Time: 12/09/2015 6:21 PM Status: Signed     Editor: Leanor Kail, PA (Physician Assistant)      Expand All Collapse All   Coronary CT showed:  IMPRESSION: 1. Coronary calcium score of 25. This was 73 percentile for age and sex matched control. 2.  Normal coronary origin with right and left co-dominance. 3. Mild to moderate non-obstructive plaque in the RCA. An aggressive risk factor modification is recommended. 4.  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 5. Mildly dilated pulmonary artery suggestive of pulmonary Hypertension.    No further work up needed. Continue bystolic 5mg  and amlodipine. Ok to discharge. Follow up in clinic few weeks (office will call with appointment) with CMP at that time.    Bhagat,Bhavinkumar, PAC              ASSESSMENT:    1. Chest pain, unspecified chest pain type   2. Essential hypertension   3. Hyperlipemia   4. Aortic valve mass      PLAN:  In order of problems listed above: Chest pain cardiac enzymes were  negative and the hospital patient is mostly having shoulder pain that sounds musculoskeletal. Follow-up with rheumatologist. CTA above coronary calcium score of 25. Recommend risk factor modification  Hypertension blood pressure is up a little today patient taken his blood pressure at home and it's been 120/85. Limit sodium and keep track  and bring it to Office visit  Hyperlipidemia patient has not had a lipid panel since 2013 from what I can see. Recommend fasting lipid panel. His LFTs have been elevated so we will be limited to what he can take. Follow-up with Dr. Meda Coffee in 4 months  Aortic valve mass without significant change over the past several years. See 2-D echo above. Follow as an outpatient.     Medication Adjustments/Labs and Tests Ordered: Current medicines are reviewed at length with the patient today.  Concerns regarding medicines are outlined above.  Medication changes, Labs and Tests ordered today are listed in the Patient Instructions below. Patient Instructions  Medication Instructions:  Your physician recommends that you continue on your current medications as directed. Please refer to the Current Medication list given to you today.   Labwork:  FASTING  LIPID IN  A WEEK OR  TWO AT YOUR  CONVENIENCE  Testing/Procedures:  Follow-Up: Your physician recommends that you schedule a follow-up appointment in: 4 MONTHS  WITH DR  Meda Coffee  Any Other Special Instructions Will Be Listed Below (If Applicable).     If you need a refill on your cardiac medications before your next appointment, please call your pharmacy.       Sumner Boast, PA-C  12/30/2015 9:03 AM    [facility] [address], [place], [place]  [id number] Phone: [phone]; Fax: [phone]

## 2015-12-31 ENCOUNTER — Encounter: Payer: Medicare Other | Admitting: Internal Medicine

## 2016-01-05 ENCOUNTER — Other Ambulatory Visit: Payer: Self-pay | Admitting: Internal Medicine

## 2016-01-05 DIAGNOSIS — Z79891 Long term (current) use of opiate analgesic: Secondary | ICD-10-CM | POA: Diagnosis not present

## 2016-01-05 DIAGNOSIS — G894 Chronic pain syndrome: Secondary | ICD-10-CM | POA: Diagnosis not present

## 2016-01-05 DIAGNOSIS — M47817 Spondylosis without myelopathy or radiculopathy, lumbosacral region: Secondary | ICD-10-CM | POA: Diagnosis not present

## 2016-01-05 DIAGNOSIS — M5137 Other intervertebral disc degeneration, lumbosacral region: Secondary | ICD-10-CM | POA: Diagnosis not present

## 2016-01-05 DIAGNOSIS — Z79899 Other long term (current) drug therapy: Secondary | ICD-10-CM | POA: Diagnosis not present

## 2016-01-05 DIAGNOSIS — M1288 Other specific arthropathies, not elsewhere classified, other specified site: Secondary | ICD-10-CM | POA: Diagnosis not present

## 2016-01-06 ENCOUNTER — Other Ambulatory Visit: Payer: Medicare Other

## 2016-01-07 ENCOUNTER — Ambulatory Visit (INDEPENDENT_AMBULATORY_CARE_PROVIDER_SITE_OTHER): Payer: Medicare Other | Admitting: Internal Medicine

## 2016-01-07 VITALS — BP 135/89 | HR 96 | Temp 98.5°F | Ht 68.0 in | Wt 185.2 lb

## 2016-01-07 DIAGNOSIS — E785 Hyperlipidemia, unspecified: Secondary | ICD-10-CM | POA: Diagnosis not present

## 2016-01-07 DIAGNOSIS — Z720 Tobacco use: Secondary | ICD-10-CM

## 2016-01-07 DIAGNOSIS — I10 Essential (primary) hypertension: Secondary | ICD-10-CM

## 2016-01-07 DIAGNOSIS — F17200 Nicotine dependence, unspecified, uncomplicated: Secondary | ICD-10-CM

## 2016-01-07 MED ORDER — NICOTINE 14 MG/24HR TD PT24
14.0000 mg | MEDICATED_PATCH | TRANSDERMAL | Status: DC
Start: 2016-01-07 — End: 2017-03-13

## 2016-01-07 MED ORDER — AMLODIPINE BESYLATE 5 MG PO TABS: 10.0000 mg | ORAL_TABLET | Freq: Every day | ORAL | Status: DC

## 2016-01-07 MED ORDER — PRAVASTATIN SODIUM 40 MG PO TABS: 40.0000 mg | ORAL_TABLET | Freq: Every evening | ORAL | Status: DC

## 2016-01-07 NOTE — Assessment & Plan Note (Signed)
His ASCVD score is 21.2%. His Last LDL was done in 2013 which was 73.  He continues to smoke 3 cigs a day. His LFTs were elevated in the past but were normalized at the most recent [facility]  -Start pravastatin 40 mg daily -Lipid panel today

## 2016-01-07 NOTE — Progress Notes (Signed)
Patient ID: [name] [name], male   DOB: 12/29/1960, 55 y.o.   MRN: [id number]    CC: hypertension, smoking cessation, and HLD HPI: [name] [name] is a 55 y.o. man with PMH noted below who is here for htn, smoking and HLD  Please see Problem List/A&P for the status of the patient's chronic medical problems   Past Medical History  Diagnosis Date  . Lupus ([place])   . History of anemia     no current med.  [name] GERD (gastroesophageal reflux disease)   . Hypertension     states is borderline on medication; has been on med. x 5-6 yr.  . Impingement syndrome of shoulder region 10/2014    left  . RA (rheumatoid arthritis) ([place])   . Heart murmur   . Pneumonia   . Anemia     Review of Systems:  Negative except as per HPI  Physical Exam: Filed Vitals:   01/07/16 0938  BP: 135/89  Pulse: 96  Temp: 98.5 F (36.9 C)  TempSrc: Oral  Height: 5\' 8"  (1.727 m)  Weight: 185 lb 3.2 oz (84.006 kg)  SpO2: 100%    General: A&O, in NAD Neck: supple, midline trachea CV: RRR, normal s1, s2, no m/r/g, no carotid bruits appreciated Resp: equal and symmetric breath sounds, no wheezing or crackles  Abdomen: soft, nontender, nondistended, +BS    Assessment & Plan:   See encounters tab for problem based medical decision making. Patient discussed with Dr. Dareen Piano

## 2016-01-07 NOTE — Assessment & Plan Note (Signed)
BP Readings from Last 3 Encounters:  01/07/16 135/89  12/30/15 126/90  12/16/15 134/87   His BP is at goal in the office but looking at his blood pressure monitor readings, they have been consistently high with the average systolic in the [id number] and average diastolic in the 0000000.  He is compliant with his amlodipine 5 mg but has not taken the nebivolol in 3 weeks.  Given that he has stopped taking his bystolic, I will increase the amlodipine to 10 mg. I do not see an indication for him to be on a beta blocker as he does not have a CAD, and monotherapy improves adherence.   Plan Increase amlodipine to 10 mg daily RTC in 1 month

## 2016-01-07 NOTE — Patient Instructions (Signed)
Thank you for your visit today Please take amlodipine 10 mg daily Please stop taking the bystolic now that you have been off of it. Please use the nicotine patch for the smoking cessation. Please take pravastatin 40 mg daily

## 2016-01-07 NOTE — Assessment & Plan Note (Signed)
He has been smoking for 30 years. Used to smoke 1.5 pack per day for about 10 years, then about 0.5 pack per day for the next 20 years.  So he has a 25 pack year smoking history. He is interested in quitting and wants nicotine patch  -Given nicotine patch -follow up with PCP for monitoring his smoking cessation efforts

## 2016-01-07 NOTE — Progress Notes (Signed)
Internal Medicine Clinic Attending  Case discussed with Dr. Saraiya at the time of the visit.  We reviewed the resident's history and exam and pertinent patient test results.  I agree with the assessment, diagnosis, and plan of care documented in the resident's note.  

## 2016-01-11 DIAGNOSIS — M19011 Primary osteoarthritis, right shoulder: Secondary | ICD-10-CM | POA: Diagnosis not present

## 2016-01-26 ENCOUNTER — Other Ambulatory Visit: Payer: Self-pay | Admitting: Internal Medicine

## 2016-01-26 ENCOUNTER — Encounter (HOSPITAL_COMMUNITY): Payer: Self-pay | Admitting: Emergency Medicine

## 2016-01-26 ENCOUNTER — Emergency Department (HOSPITAL_COMMUNITY): Payer: Medicare Other

## 2016-01-26 ENCOUNTER — Emergency Department (HOSPITAL_COMMUNITY)
Admission: EM | Admit: 2016-01-26 | Discharge: 2016-01-26 | Disposition: A | Discharge status: Home / Self Care | Payer: Medicare Other | Attending: Emergency Medicine | Admitting: Emergency Medicine

## 2016-01-26 DIAGNOSIS — Z7982 Long term (current) use of aspirin: Secondary | ICD-10-CM | POA: Insufficient documentation

## 2016-01-26 DIAGNOSIS — F17210 Nicotine dependence, cigarettes, uncomplicated: Secondary | ICD-10-CM | POA: Diagnosis not present

## 2016-01-26 DIAGNOSIS — K219 Gastro-esophageal reflux disease without esophagitis: Secondary | ICD-10-CM | POA: Diagnosis not present

## 2016-01-26 DIAGNOSIS — R05 Cough: Secondary | ICD-10-CM | POA: Diagnosis not present

## 2016-01-26 DIAGNOSIS — R0602 Shortness of breath: Secondary | ICD-10-CM | POA: Diagnosis not present

## 2016-01-26 DIAGNOSIS — I10 Essential (primary) hypertension: Secondary | ICD-10-CM | POA: Diagnosis not present

## 2016-01-26 LAB — CBC WITH DIFFERENTIAL/PLATELET
BASOS ABS: 0 10*3/uL (ref 0.0–0.1)
BASOS PCT: 0 %
EOS ABS: 0.1 10*3/uL (ref 0.0–0.7)
Eosinophils Relative: 2 %
HEMATOCRIT: 38.8 % — AB (ref 39.0–52.0)
HEMOGLOBIN: 12.3 g/dL — AB (ref 13.0–17.0)
Lymphocytes Relative: 24 %
Lymphs Abs: 1.8 10*3/uL (ref 0.7–4.0)
MCH: 28.6 pg (ref 26.0–34.0)
MCHC: 31.7 g/dL (ref 30.0–36.0)
MCV: 90.2 fL (ref 78.0–100.0)
Monocytes Absolute: 0.4 10*3/uL (ref 0.1–1.0)
Monocytes Relative: 6 %
NEUTROS ABS: 4.9 10*3/uL (ref 1.7–7.7)
NEUTROS PCT: 68 %
Platelets: 184 10*3/uL (ref 150–400)
RBC: 4.3 MIL/uL (ref 4.22–5.81)
RDW: 14 % (ref 11.5–15.5)
WBC: 7.2 10*3/uL (ref 4.0–10.5)

## 2016-01-26 LAB — COMPREHENSIVE METABOLIC PANEL
ALBUMIN: 3.5 g/dL (ref 3.5–5.0)
ALK PHOS: 125 U/L (ref 38–126)
ALT: 74 U/L — ABNORMAL HIGH (ref 17–63)
ANION GAP: 4 — AB (ref 5–15)
AST: 40 U/L (ref 15–41)
BILIRUBIN TOTAL: 0.4 mg/dL (ref 0.3–1.2)
BUN: 13 mg/dL (ref 6–20)
CALCIUM: 9.2 mg/dL (ref 8.9–10.3)
CO2: 30 mmol/L (ref 22–32)
Chloride: 105 mmol/L (ref 101–111)
Creatinine, Ser: 0.95 mg/dL (ref 0.61–1.24)
GFR calc Af Amer: >60 mL/min (ref >60)
GFR calc non Af Amer: >60 mL/min (ref >60)
GLUCOSE: 102 mg/dL — AB (ref 65–99)
POTASSIUM: 3.9 mmol/L (ref 3.5–5.1)
SODIUM: 139 mmol/L (ref 135–145)
Total Protein: 7 g/dL (ref 6.5–8.1)

## 2016-01-26 LAB — I-STAT TROPONIN, ED: TROPONIN I, POC: 0 ng/mL (ref 0.00–0.08)

## 2016-01-26 NOTE — ED Notes (Signed)
MD at bedside. 

## 2016-01-26 NOTE — ED Provider Notes (Signed)
[facility] DEPT Provider Note   CSN: [id number] Arrival date & time: 01/26/16  [id number]  First Provider Contact:  First MD Initiated Contact with Patient 01/26/16 [phone]        History   Chief Complaint Chief Complaint  Patient presents with  . Shortness of Breath    HPI Selwyn Vito is a 55 y.o. male.  Patient is a 55 year old male presenting today with intermittent shortness of breath and has been ongoing for a while but for the last 4 days he has developed 5 out of 10 dull left upper abdominal pain when he lays down at night that causes him to feel short of breath. That usually improves with sitting up but is not affected by eating. He is able to walk and exert himself during the day without any change in his baseline status.   The history is provided by the patient.  Shortness of Breath  This is a recurrent problem. The average episode lasts 30 minutes. The problem occurs intermittently.The current episode started 3 to 5 hours ago. The problem has been resolved. Associated symptoms include wheezing and abdominal pain. Pertinent negatives include no fever, no rhinorrhea, no sore throat, no cough, no sputum production, no chest pain, no vomiting, no leg pain and no leg swelling. Associated symptoms comments: Patient states he will initially get an uncomfortable pain in his left upper abdomen and then will experience shortness of breath that usually occurs when he lays down at night for the last 4 nights but he has had intermittently before that. It is unknown what precipitated the problem. Risk factors include smoking. Treatments tried: Improves with sitting up. He has had no prior hospitalizations. He has had prior ED visits. Associated medical issues comments: History of anemia, GERD, heart murmur, hypertension and lupus.    Past Medical History:  Diagnosis Date  . Anemia   . GERD (gastroesophageal reflux disease)   . Heart murmur   . History of anemia    no current med.  .  Hypertension    states is borderline on medication; has been on med. x 5-6 yr.  . Impingement syndrome of shoulder region 10/2014   left  . Lupus ([place])   . Pneumonia   . RA (rheumatoid arthritis) [facility])     Patient Active Problem List   Diagnosis Date Noted  . 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 Left shoulder pain 12/16/2015  . Transaminitis 12/16/2015  . Chest pain 12/09/2015  . AKI (acute kidney injury) ([place]) 12/09/2015  . Sinus bradycardia   . Lightheadedness 12/08/2015  . Need for prophylactic vaccination and inoculation against influenza 06/02/2015  . Insomnia 06/01/2015  . Rotator cuff tear 11/04/2014  . Injection site reaction 09/02/2014  . Back muscle spasm 05/11/2014  . Bilateral shoulder pain 08/20/2012  . Tobacco abuse 08/20/2012  . Anemia 08/06/2012  . Dizziness and giddiness, h/o 08/06/2012  . HTN (hypertension) 04/20/2011  . BACK PAIN 10/29/2009  . HLD (hyperlipidemia) 10/08/2009  . Systemic lupus erythematosus ([place]) 08/11/2009  . Rheumatoid arthritis ([place]) 08/11/2009    Past Surgical History:  Procedure Laterality Date  . ACHILLES TENDON SURGERY Bilateral   . OSTEOTOMY AND ULNAR SHORTENING Right 07/22/2002  . SHOULDER ARTHROSCOPY WITH BICEPSTENOTOMY Right 03/11/2015   Procedure: SHOULDER ARTHROSCOPY WITH BICEPSTENOTOMY;  Surgeon: Ninetta Lights, MD;  Location: [facility];  Service: Orthopedics;  Laterality: Right;  . SHOULDER ARTHROSCOPY WITH DISTAL CLAVICLE RESECTION Left 11/19/2014   Procedure: SHOULDER ARTHROSCOPY WITH DISTAL CLAVICLE RESECTION;  Surgeon: Kathryne Hitch, MD;  Location: [facility];  Service: Orthopedics;  Laterality: Left;  . SHOULDER ARTHROSCOPY WITH ROTATOR CUFF REPAIR AND SUBACROMIAL DECOMPRESSION Left 11/19/2014   Procedure: LEFT SHOULDER ARTHROSCOPY, DEBRIDEMENT DISTAL CLAVICLE EXCISION, ACROMIOPLASTY WITH ROTATOR CUFF REPAIR ;  Surgeon: Kathryne Hitch, MD;  Location: [facility];  Service: Orthopedics;   Laterality: Left;  [name] VIDEO ASSISTED THORACOSCOPY (VATS)/DECORTICATION Right 11/21/2011   drainage of empyema       Home Medications    Prior to Admission medications   Medication Sig Start Date End Date Taking? Authorizing Provider  amLODipine (NORVASC) 5 MG tablet Take 2 tablets (10 mg total) by mouth daily. 01/07/16  Yes Burgess Estelle, MD  aspirin EC 81 MG EC tablet Take 1 tablet (81 mg total) by mouth daily. 12/10/15  Yes Norman Herrlich, MD  baclofen (LIORESAL) 10 MG tablet Take 10 mg by mouth 2 (two) times daily as needed for muscle spasms.  11/11/15  Yes Historical Provider, MD  ferrous sulfate 325 (65 FE) MG tablet Take 325 mg by mouth daily with breakfast.   Yes Historical Provider, MD  nitroGLYCERIN (NITROLINGUAL) 0.4 MG/SPRAY spray Place 1 spray under the tongue every 5 (five) minutes x 3 doses as needed for chest pain. 12/10/15  Yes Norman Herrlich, MD  oxyCODONE-acetaminophen (PERCOCET) 10-325 MG tablet Take 1 tablet by mouth daily as needed for pain.  12/08/15  Yes Historical Provider, MD  pantoprazole (PROTONIX) 40 MG tablet TAKE 1 TABLET (40 MG TOTAL) BY MOUTH 2 (TWO) TIMES DAILY. 01/07/15  Yes Bartholomew Crews, MD  pravastatin (PRAVACHOL) 40 MG tablet Take 1 tablet (40 mg total) by mouth every evening. 01/07/16 01/06/17 Yes Burgess Estelle, MD  nicotine (NICODERM CQ - DOSED IN MG/24 HOURS) 14 mg/24hr patch Place 1 patch (14 mg total) onto the skin daily. Patient not taking: Reported on 01/26/2016 01/07/16   Burgess Estelle, MD  ondansetron (ZOFRAN) 4 MG tablet Take 1 tablet (4 mg total) by mouth every 8 (eight) hours as needed for nausea or vomiting. Patient not taking: Reported on 01/26/2016 11/19/14   Aundra Dubin, PA-C    Family History Family History  Problem Relation Age of Onset  . Heart disease Father 41  . Stroke Neg Hx   . Cancer Neg Hx     Social History Social History  Substance Use Topics  . Smoking status: Current Every Day Smoker    Packs/day: 0.40    Years:  20.00    Types: Cigars, Cigarettes  . Smokeless tobacco: Never Used     Comment: 3-8  per cig./day  . Alcohol use No     Allergies   Ramipril [ramipril] and Tramadol   Review of Systems Review of Systems  Constitutional: Negative for fever.  HENT: Negative for rhinorrhea and sore throat.   Respiratory: Positive for shortness of breath and wheezing. Negative for cough and sputum production.   Cardiovascular: Negative for chest pain and leg swelling.  Gastrointestinal: Positive for abdominal pain. Negative for vomiting.  All other systems reviewed and are negative.    Physical Exam Updated Vital Signs BP 137/94   Pulse 76   Temp 97.9 F (36.6 C) (Oral)   Resp 15   Ht 5\' 8"  (1.727 m)   Wt 185 lb (83.9 kg)   SpO2 96%   BMI 28.13 kg/m   Physical Exam  Constitutional: He is oriented to person, place, and time. He appears well-developed and well-nourished. No distress.  HENT:  Head: Normocephalic and atraumatic.  Mouth/Throat: Oropharynx is clear and moist.  Eyes: Conjunctivae and EOM are normal. Pupils are equal, round, and reactive to light.  Neck: Normal range of motion. Neck supple.  Cardiovascular: Normal rate, regular rhythm and intact distal pulses.   Murmur heard. Pulmonary/Chest: Effort normal and breath sounds normal. No respiratory distress. He has no wheezes. He has no rales.  Abdominal: Soft. He exhibits no distension. There is no tenderness. There is no rebound and no guarding.  Musculoskeletal: Normal range of motion. He exhibits no edema or tenderness.  No lower extremity edema or tenderness  Neurological: He is alert and oriented to person, place, and time.  Skin: Skin is warm and dry. No rash noted. No erythema.  Psychiatric: He has a normal mood and affect. His behavior is normal.  Nursing note and vitals reviewed.    ED Treatments / Results  Labs (all labs ordered are listed, but only abnormal results are displayed) Labs Reviewed  CBC WITH  DIFFERENTIAL/PLATELET - Abnormal; Notable for the following:       Result Value   Hemoglobin 12.3 (*)    HCT 38.8 (*)    All other components within normal limits  COMPREHENSIVE METABOLIC PANEL - Abnormal; Notable for the following:    Glucose, Bld 102 (*)    ALT 74 (*)    Anion gap 4 (*)    All other components within normal limits  I-STAT TROPOININ, ED    EKG  EKG Interpretation  Date/Time:  Wednesday January 26 2016 02:59:18 EDT Ventricular Rate:  86 PR Interval:  152 QRS Duration: 92 QT Interval:  354 QTC Calculation: 423 R Axis:   59 Text Interpretation:  Normal sinus rhythm Possible Anterior infarct , age undetermined No significant change since last tracing Reconfirmed by [facility]  MD, Madi Bonfiglio ([id number]) on 01/26/2016 8:18:22 AM       Radiology Dg Chest 2 View  Result Date: 01/26/2016 CLINICAL DATA:  Cough and shortness of breath. Symptoms for 7 hours. EXAM: CHEST  2 VIEW COMPARISON:  Radiograph 12/08/2015. Chest CT for calcium scoring 12/09/2015. FINDINGS: Cardiomegaly is unchanged. There is tortuosity of the thoracic aorta. No pulmonary edema, focal airspace disease, large pleural effusion or pneumothorax. No acute osseous abnormalities are seen. IMPRESSION: Stable cardiomegaly.  No localizing process. Electronically Signed   By: Jeb Levering M.D.   On: 01/26/2016 03:46    Procedures Procedures (including critical care time)  Medications Ordered in ED Medications - No data to display   Initial Impression / Assessment and Plan / ED Course  I have reviewed the triage vital signs and the nursing notes.  Pertinent labs & imaging results that were available during my care of the patient were reviewed by me and considered in my medical decision making (see chart for details).  Clinical Course   Patient is a 55 year old male presenting today with atypical story for left-sided abdominal pain and shortness of breath that has occurred for the last 4 days when he is laid  down to go to sleep. He denies any symptoms during the day or with exertion. No infectious symptoms such as cough or fever. He denies any nausea, vomiting or diarrhea. It does not seem to be exacerbated with eating. Patient does have a history of arthritis and was taking 800 mg of ibuprofen on a regular basis and stopped a few days ago. He does take Protonix once a day and denies any other recent medication changes. Patient was seen  by Dr. Meda Coffee several weeks ago and at that time had a coronary CT showing normal coronary arteries except for 25% stenosis in the RCA. He also has a nodule that is being monitored in his left upper lobe. Patient denies any signs of fluid overload but wife is concerned he may have some sleep apnea. EKG unchanged. Chest x-ray with cardiomegaly that is unchanged without evidence of effusions. On exam patient has no acute findings. Low suspicion that this is PE or dissection. He has some mild reproducible tenderness in the left upper quadrant but denies alcohol use.  Concern for possible GI pathology as the cause of his symptoms especially since he usually gets it with lying down at night.  Low suspicion for ACS or cardiac pathology at this time. Patient's troponin, CBC, CMP and chest x-ray without acute findings. Patient told to increase his Protonix to twice a day and avoid ibuprofen, naproxen and any NSAIDs.   Final Clinical Impressions(s) / ED Diagnoses   Final diagnoses:  SOB (shortness of breath)  Gastroesophageal reflux disease without esophagitis    New Prescriptions New Prescriptions   No medications on file     Blanchie Dessert, MD 01/27/16 [phone]

## 2016-01-26 NOTE — ED Triage Notes (Signed)
Pt. reports SOB with occasional productive cough onset this evening , denies fever or chills.

## 2016-02-02 DIAGNOSIS — M5137 Other intervertebral disc degeneration, lumbosacral region: Secondary | ICD-10-CM | POA: Diagnosis not present

## 2016-02-02 DIAGNOSIS — M1288 Other specific arthropathies, not elsewhere classified, other specified site: Secondary | ICD-10-CM | POA: Diagnosis not present

## 2016-02-02 DIAGNOSIS — Z79899 Other long term (current) drug therapy: Secondary | ICD-10-CM | POA: Diagnosis not present

## 2016-02-02 DIAGNOSIS — G894 Chronic pain syndrome: Secondary | ICD-10-CM | POA: Diagnosis not present

## 2016-02-02 DIAGNOSIS — Z79891 Long term (current) use of opiate analgesic: Secondary | ICD-10-CM | POA: Diagnosis not present

## 2016-02-02 DIAGNOSIS — M47817 Spondylosis without myelopathy or radiculopathy, lumbosacral region: Secondary | ICD-10-CM | POA: Diagnosis not present

## 2016-02-04 ENCOUNTER — Encounter: Payer: Medicare Other | Admitting: Internal Medicine

## 2016-02-14 ENCOUNTER — Other Ambulatory Visit: Payer: Self-pay | Admitting: Internal Medicine

## 2016-02-16 NOTE — Telephone Encounter (Signed)
[facility] calling for refill status.  Will send to pcp for review.  Please advise.Despina Hidden Cassady8/23/20174:48 PM

## 2016-02-23 DIAGNOSIS — M19019 Primary osteoarthritis, unspecified shoulder: Secondary | ICD-10-CM | POA: Diagnosis not present

## 2016-02-23 DIAGNOSIS — M25519 Pain in unspecified shoulder: Secondary | ICD-10-CM | POA: Diagnosis not present

## 2016-02-23 DIAGNOSIS — M75100 Unspecified rotator cuff tear or rupture of unspecified shoulder, not specified as traumatic: Secondary | ICD-10-CM | POA: Diagnosis not present

## 2016-02-25 ENCOUNTER — Encounter: Payer: Self-pay | Admitting: Internal Medicine

## 2016-02-25 ENCOUNTER — Ambulatory Visit (INDEPENDENT_AMBULATORY_CARE_PROVIDER_SITE_OTHER): Payer: Medicare Other | Admitting: Internal Medicine

## 2016-02-25 VITALS — BP 136/94 | HR 92 | Temp 98.4°F | Ht 68.0 in | Wt 184.7 lb

## 2016-02-25 DIAGNOSIS — M069 Rheumatoid arthritis, unspecified: Secondary | ICD-10-CM | POA: Diagnosis not present

## 2016-02-25 DIAGNOSIS — M75102 Unspecified rotator cuff tear or rupture of left shoulder, not specified as traumatic: Secondary | ICD-10-CM

## 2016-02-25 DIAGNOSIS — F17210 Nicotine dependence, cigarettes, uncomplicated: Secondary | ICD-10-CM

## 2016-02-25 DIAGNOSIS — M25512 Pain in left shoulder: Secondary | ICD-10-CM

## 2016-02-25 DIAGNOSIS — Z23 Encounter for immunization: Secondary | ICD-10-CM | POA: Diagnosis not present

## 2016-02-25 DIAGNOSIS — M0579 Rheumatoid arthritis with rheumatoid factor of multiple sites without organ or systems involvement: Secondary | ICD-10-CM

## 2016-02-25 DIAGNOSIS — Z720 Tobacco use: Secondary | ICD-10-CM

## 2016-02-25 DIAGNOSIS — I10 Essential (primary) hypertension: Secondary | ICD-10-CM

## 2016-02-25 MED ORDER — AMLODIPINE BESYLATE 10 MG PO TABS: 10.0000 mg | ORAL_TABLET | Freq: Every day | ORAL | 6 refills | Status: DC

## 2016-02-25 NOTE — Progress Notes (Signed)
   CC: Hypertension, shoulder pain  HPI:  [name] [name] is a 55 y.o. man with medical history detailed below presenting here in follow up for his hypertension also with ongoing joint pain particularly his left shoulder. He was recently started on amlodipine 10mg  and he is feeling well. He does note some leg swelling that started since this medication. He also has worsening of pain in his left shoulder that was not provoked by any new injury that he can recall.  See problem based assessment and plan below for additional details.  Past Medical History:  Diagnosis Date  . Anemia   . GERD (gastroesophageal reflux disease)   . Heart murmur   . History of anemia    no current med.  . Hypertension    states is borderline on medication; has been on med. x 5-6 yr.  . Impingement syndrome of shoulder region 10/2014   left  . Lupus ([place])   . Pneumonia   . RA (rheumatoid arthritis) ([facility])     Review of Systems:  Review of Systems  Eyes: Negative for blurred vision.  Respiratory: Negative for shortness of breath.   Cardiovascular: Positive for leg swelling. Negative for chest pain.  Musculoskeletal: Positive for joint pain.  Neurological: Negative for dizziness, weakness and headaches.  Endo/Heme/Allergies: Negative for polydipsia.  Psychiatric/Behavioral: The patient does not have insomnia.     Physical Exam:  Vitals:   02/25/16 1503  BP: (!) 136/94  Pulse: 92  Temp: 98.4 F (36.9 C)  TempSrc: Oral  SpO2: 97%  Weight: 184 lb 11.2 oz (83.8 kg)  Height: 5\' 8"  (1.727 m)    GENERAL- alert, co-operative, NAD HEENT- Atraumatic, oral mucosa appears moist CARDIAC- RRR, no murmurs, rubs or gallops. RESP- CTAB, no wheezes or crackles. EXTREMITIES- 1+ pitting pedal edema bilaterally, left shoulder anteriorly deviated with significant pain throughout active ROM, hands with MCP fullness and ulnar deviation and early swan necking deformity on about 4 fingers SKIN- Warm, dry, No rash or  lesion. PSYCH- Normal mood and affect, appropriate thought content and speech.    Assessment & Plan:   See Encounters Tab for problem based charting.  Patient discussed with Dr. Daryll Drown

## 2016-02-25 NOTE — Patient Instructions (Signed)
It was a pleasure to see you today [name] [name].  I am glad to hear you made progress at reducing smoking. This is a major risk factor for heart disease, stomach ulcers, and lung disease all of which can cause you shortness of breath and chest pain episodes in the future.  You should be established with [facility] for follow up of your shoulder so call them about making a new appointment. If you have any difficulties with this please let us know immediately so I can facilitate the process.  Leg swelling is a common side effect of amlodipine which was started recently. If this becomes worse or painful please let us know since it could require a change in treatment for blood pressure.

## 2016-02-28 NOTE — Assessment & Plan Note (Signed)
Influenza vaccine administered today.

## 2016-02-28 NOTE — Assessment & Plan Note (Signed)
He has been referred to orthopedic surgery as there is clearly structural defect with some impingement during movement. I recommended him to call and arrange a new appointment with them as he is already an established patient from what I can tell.

## 2016-02-28 NOTE — Assessment & Plan Note (Signed)
He is having active symptoms symptoms with degenerative changes over the past few years. He reports what sounds like undergoing infusions with infliximab at [facility] until about 3 months ago. He stopped because he feels these were causing his blood pressure to go crazy and feel very poorly after each infusion.  He has an appointment on 9/25 with Berna Bue

## 2016-02-28 NOTE — Assessment & Plan Note (Signed)
He has made some good progress with the patch and decreased smoking to about 2 packs per week. Compared to last year this is about 1/4 as much. He says that he has never been able to just quit smoking all at once they way he quit drinking. I encouraged him it's a process and the less he can smoke the better. He has a lot less life stressors compared to last year so quitting seems more attainable.

## 2016-02-28 NOTE — Assessment & Plan Note (Signed)
Blood pressure is good today on amlodipine alone. He does have increased leg swelling but does not seem bothered by it at this time. I recommend he continue the current treatment but if swelling increases more or he has problems with it we may need a change in therapy.  Continue amlodipine 10mg  daily

## 2016-02-29 NOTE — Progress Notes (Signed)
Internal Medicine Clinic Attending  Case discussed with Dr. Rice soon after the resident saw the patient.  We reviewed the resident's history and exam and pertinent patient test results.  I agree with the assessment, diagnosis, and plan of care documented in the resident's note. 

## 2016-03-01 DIAGNOSIS — Z79891 Long term (current) use of opiate analgesic: Secondary | ICD-10-CM | POA: Diagnosis not present

## 2016-03-01 DIAGNOSIS — Z79899 Other long term (current) drug therapy: Secondary | ICD-10-CM | POA: Diagnosis not present

## 2016-03-01 DIAGNOSIS — M5137 Other intervertebral disc degeneration, lumbosacral region: Secondary | ICD-10-CM | POA: Diagnosis not present

## 2016-03-01 DIAGNOSIS — M1288 Other specific arthropathies, not elsewhere classified, other specified site: Secondary | ICD-10-CM | POA: Diagnosis not present

## 2016-03-01 DIAGNOSIS — G894 Chronic pain syndrome: Secondary | ICD-10-CM | POA: Diagnosis not present

## 2016-03-01 DIAGNOSIS — M47817 Spondylosis without myelopathy or radiculopathy, lumbosacral region: Secondary | ICD-10-CM | POA: Diagnosis not present

## 2016-03-29 DIAGNOSIS — M1288 Other specific arthropathies, not elsewhere classified, other specified site: Secondary | ICD-10-CM | POA: Diagnosis not present

## 2016-03-29 DIAGNOSIS — G894 Chronic pain syndrome: Secondary | ICD-10-CM | POA: Diagnosis not present

## 2016-03-29 DIAGNOSIS — Z79891 Long term (current) use of opiate analgesic: Secondary | ICD-10-CM | POA: Diagnosis not present

## 2016-03-29 DIAGNOSIS — Z79899 Other long term (current) drug therapy: Secondary | ICD-10-CM | POA: Diagnosis not present

## 2016-03-29 DIAGNOSIS — M5137 Other intervertebral disc degeneration, lumbosacral region: Secondary | ICD-10-CM | POA: Diagnosis not present

## 2016-03-29 DIAGNOSIS — M47817 Spondylosis without myelopathy or radiculopathy, lumbosacral region: Secondary | ICD-10-CM | POA: Diagnosis not present

## 2016-03-31 DIAGNOSIS — M25512 Pain in left shoulder: Secondary | ICD-10-CM | POA: Diagnosis not present

## 2016-03-31 DIAGNOSIS — M19011 Primary osteoarthritis, right shoulder: Secondary | ICD-10-CM | POA: Diagnosis not present

## 2016-04-04 NOTE — Addendum Note (Signed)
Addended by: Hulan Fray on: 04/04/2016 06:14 PM   Modules accepted: Orders

## 2016-04-06 ENCOUNTER — Other Ambulatory Visit: Payer: Self-pay | Admitting: Internal Medicine

## 2016-04-12 ENCOUNTER — Other Ambulatory Visit: Payer: Self-pay | Admitting: Orthopedic Surgery

## 2016-04-12 DIAGNOSIS — M25512 Pain in left shoulder: Secondary | ICD-10-CM

## 2016-04-12 DIAGNOSIS — M19011 Primary osteoarthritis, right shoulder: Secondary | ICD-10-CM | POA: Diagnosis not present

## 2016-04-26 ENCOUNTER — Other Ambulatory Visit: Payer: Self-pay | Admitting: Internal Medicine

## 2016-04-26 ENCOUNTER — Other Ambulatory Visit: Payer: Medicare Other

## 2016-04-26 DIAGNOSIS — M5137 Other intervertebral disc degeneration, lumbosacral region: Secondary | ICD-10-CM | POA: Diagnosis not present

## 2016-04-26 DIAGNOSIS — G894 Chronic pain syndrome: Secondary | ICD-10-CM | POA: Diagnosis not present

## 2016-04-26 DIAGNOSIS — M1288 Other specific arthropathies, not elsewhere classified, other specified site: Secondary | ICD-10-CM | POA: Diagnosis not present

## 2016-04-26 DIAGNOSIS — M25519 Pain in unspecified shoulder: Secondary | ICD-10-CM | POA: Diagnosis not present

## 2016-04-26 DIAGNOSIS — Z79891 Long term (current) use of opiate analgesic: Secondary | ICD-10-CM | POA: Diagnosis not present

## 2016-04-26 DIAGNOSIS — Z79899 Other long term (current) drug therapy: Secondary | ICD-10-CM | POA: Diagnosis not present

## 2016-05-08 ENCOUNTER — Ambulatory Visit
Admission: RE | Admit: 2016-05-08 | Discharge: 2016-05-08 | Disposition: A | Discharge status: Home / Self Care | Payer: Medicare Other | Source: Ambulatory Visit | Attending: Orthopedic Surgery | Admitting: Orthopedic Surgery

## 2016-05-08 DIAGNOSIS — M5137 Other intervertebral disc degeneration, lumbosacral region: Secondary | ICD-10-CM | POA: Diagnosis not present

## 2016-05-08 DIAGNOSIS — M25512 Pain in left shoulder: Secondary | ICD-10-CM

## 2016-05-08 DIAGNOSIS — M7582 Other shoulder lesions, left shoulder: Secondary | ICD-10-CM | POA: Diagnosis not present

## 2016-05-08 DIAGNOSIS — M47817 Spondylosis without myelopathy or radiculopathy, lumbosacral region: Secondary | ICD-10-CM | POA: Diagnosis not present

## 2016-05-08 DIAGNOSIS — M545 Low back pain: Secondary | ICD-10-CM | POA: Diagnosis not present

## 2016-05-08 MED ORDER — IOPAMIDOL (ISOVUE-M 200) INJECTION 41%
15.0000 mL | Freq: Once | INTRAMUSCULAR | Status: AC
  Administered 2016-05-08: 15 mL via INTRA_ARTICULAR

## 2016-05-16 DIAGNOSIS — M19011 Primary osteoarthritis, right shoulder: Secondary | ICD-10-CM | POA: Diagnosis not present

## 2016-05-22 DIAGNOSIS — G894 Chronic pain syndrome: Secondary | ICD-10-CM | POA: Diagnosis not present

## 2016-05-22 DIAGNOSIS — M47817 Spondylosis without myelopathy or radiculopathy, lumbosacral region: Secondary | ICD-10-CM | POA: Diagnosis not present

## 2016-05-22 DIAGNOSIS — Z79899 Other long term (current) drug therapy: Secondary | ICD-10-CM | POA: Diagnosis not present

## 2016-05-22 DIAGNOSIS — M79606 Pain in leg, unspecified: Secondary | ICD-10-CM | POA: Diagnosis not present

## 2016-05-22 DIAGNOSIS — M5137 Other intervertebral disc degeneration, lumbosacral region: Secondary | ICD-10-CM | POA: Diagnosis not present

## 2016-05-22 DIAGNOSIS — Z79891 Long term (current) use of opiate analgesic: Secondary | ICD-10-CM | POA: Diagnosis not present

## 2016-05-29 DIAGNOSIS — G8918 Other acute postprocedural pain: Secondary | ICD-10-CM | POA: Diagnosis not present

## 2016-05-29 DIAGNOSIS — M24112 Other articular cartilage disorders, left shoulder: Secondary | ICD-10-CM | POA: Diagnosis not present

## 2016-05-29 DIAGNOSIS — S46012A Strain of muscle(s) and tendon(s) of the rotator cuff of left shoulder, initial encounter: Secondary | ICD-10-CM | POA: Diagnosis not present

## 2016-05-29 DIAGNOSIS — T84098A Other mechanical complication of other internal joint prosthesis, initial encounter: Secondary | ICD-10-CM | POA: Diagnosis not present

## 2016-05-29 DIAGNOSIS — M24012 Loose body in left shoulder: Secondary | ICD-10-CM | POA: Diagnosis not present

## 2016-05-29 DIAGNOSIS — M19012 Primary osteoarthritis, left shoulder: Secondary | ICD-10-CM | POA: Diagnosis not present

## 2016-06-05 DIAGNOSIS — M5137 Other intervertebral disc degeneration, lumbosacral region: Secondary | ICD-10-CM | POA: Diagnosis not present

## 2016-06-05 DIAGNOSIS — M47817 Spondylosis without myelopathy or radiculopathy, lumbosacral region: Secondary | ICD-10-CM | POA: Diagnosis not present

## 2016-06-06 DIAGNOSIS — M24012 Loose body in left shoulder: Secondary | ICD-10-CM | POA: Diagnosis not present

## 2016-06-14 DIAGNOSIS — M5137 Other intervertebral disc degeneration, lumbosacral region: Secondary | ICD-10-CM | POA: Diagnosis not present

## 2016-06-14 DIAGNOSIS — G894 Chronic pain syndrome: Secondary | ICD-10-CM | POA: Diagnosis not present

## 2016-06-14 DIAGNOSIS — M79606 Pain in leg, unspecified: Secondary | ICD-10-CM | POA: Diagnosis not present

## 2016-06-14 DIAGNOSIS — Z79899 Other long term (current) drug therapy: Secondary | ICD-10-CM | POA: Diagnosis not present

## 2016-06-14 DIAGNOSIS — Z79891 Long term (current) use of opiate analgesic: Secondary | ICD-10-CM | POA: Diagnosis not present

## 2016-06-14 DIAGNOSIS — M47817 Spondylosis without myelopathy or radiculopathy, lumbosacral region: Secondary | ICD-10-CM | POA: Diagnosis not present

## 2016-07-03 DIAGNOSIS — M47817 Spondylosis without myelopathy or radiculopathy, lumbosacral region: Secondary | ICD-10-CM | POA: Diagnosis not present

## 2016-07-03 DIAGNOSIS — M5137 Other intervertebral disc degeneration, lumbosacral region: Secondary | ICD-10-CM | POA: Diagnosis not present

## 2016-07-05 DIAGNOSIS — M7060 Trochanteric bursitis, unspecified hip: Secondary | ICD-10-CM | POA: Diagnosis not present

## 2016-07-17 DIAGNOSIS — M5137 Other intervertebral disc degeneration, lumbosacral region: Secondary | ICD-10-CM | POA: Diagnosis not present

## 2016-07-17 DIAGNOSIS — Z79891 Long term (current) use of opiate analgesic: Secondary | ICD-10-CM | POA: Diagnosis not present

## 2016-07-17 DIAGNOSIS — M47817 Spondylosis without myelopathy or radiculopathy, lumbosacral region: Secondary | ICD-10-CM | POA: Diagnosis not present

## 2016-07-17 DIAGNOSIS — Z79899 Other long term (current) drug therapy: Secondary | ICD-10-CM | POA: Diagnosis not present

## 2016-07-17 DIAGNOSIS — G894 Chronic pain syndrome: Secondary | ICD-10-CM | POA: Diagnosis not present

## 2016-07-17 DIAGNOSIS — M79606 Pain in leg, unspecified: Secondary | ICD-10-CM | POA: Diagnosis not present

## 2016-07-25 DIAGNOSIS — M25572 Pain in left ankle and joints of left foot: Secondary | ICD-10-CM | POA: Diagnosis not present

## 2016-07-25 DIAGNOSIS — M24012 Loose body in left shoulder: Secondary | ICD-10-CM | POA: Diagnosis not present

## 2016-07-25 DIAGNOSIS — M170 Bilateral primary osteoarthritis of knee: Secondary | ICD-10-CM | POA: Diagnosis not present

## 2016-08-02 DIAGNOSIS — M5137 Other intervertebral disc degeneration, lumbosacral region: Secondary | ICD-10-CM | POA: Diagnosis not present

## 2016-08-02 DIAGNOSIS — M79606 Pain in leg, unspecified: Secondary | ICD-10-CM | POA: Diagnosis not present

## 2016-08-02 DIAGNOSIS — G894 Chronic pain syndrome: Secondary | ICD-10-CM | POA: Diagnosis not present

## 2016-08-02 DIAGNOSIS — M47817 Spondylosis without myelopathy or radiculopathy, lumbosacral region: Secondary | ICD-10-CM | POA: Diagnosis not present

## 2016-08-10 ENCOUNTER — Ambulatory Visit: Payer: Medicare Other | Attending: Orthopedic Surgery

## 2016-08-10 DIAGNOSIS — M25611 Stiffness of right shoulder, not elsewhere classified: Secondary | ICD-10-CM

## 2016-08-10 DIAGNOSIS — M25512 Pain in left shoulder: Secondary | ICD-10-CM | POA: Insufficient documentation

## 2016-08-10 DIAGNOSIS — Z9889 Other specified postprocedural states: Secondary | ICD-10-CM | POA: Insufficient documentation

## 2016-08-10 DIAGNOSIS — M6281 Muscle weakness (generalized): Secondary | ICD-10-CM | POA: Insufficient documentation

## 2016-08-10 NOTE — Therapy (Addendum)
[place], [place], [address] Phone: [phone]   Fax:  [phone]  Physical Therapy Evaluation  Patient Details  Name: [name] [name] MRN: [id number] Date of Birth: 02/24/1961 Referring Provider: Kathryne Hitch, MD  Encounter Date: 08/10/2016      PT End of Session - 08/10/16 1457    Visit Number 1   Number of Visits 12   Date for PT Re-Evaluation 09/22/16   Authorization Type [employer]   PT Start Time 0255   PT Stop Time 0345   PT Time Calculation (min) 50 min   Activity Tolerance Patient tolerated treatment well   Behavior During Therapy [facility] for tasks assessed/performed      Past Medical History:  Diagnosis Date  . Anemia   . GERD (gastroesophageal reflux disease)   . Heart murmur   . History of anemia    no current med.  . Hypertension    states is borderline on medication; has been on med. x 5-6 yr.  . Impingement syndrome of shoulder region 10/2014   left  . Lupus   . Pneumonia   . RA (rheumatoid arthritis) ([place])     Past Surgical History:  Procedure Laterality Date  . ACHILLES TENDON SURGERY Bilateral   . OSTEOTOMY AND ULNAR SHORTENING Right 07/22/2002  . SHOULDER ARTHROSCOPY WITH BICEPSTENOTOMY Right 03/11/2015   Procedure: SHOULDER ARTHROSCOPY WITH BICEPSTENOTOMY;  Surgeon: Ninetta Lights, MD;  Location: [place];  Service: Orthopedics;  Laterality: Right;  . SHOULDER ARTHROSCOPY WITH DISTAL CLAVICLE RESECTION Left 11/19/2014   Procedure: SHOULDER ARTHROSCOPY WITH DISTAL CLAVICLE RESECTION;  Surgeon: Kathryne Hitch, MD;  Location: [place];  Service: Orthopedics;  Laterality: Left;  . SHOULDER ARTHROSCOPY WITH ROTATOR CUFF REPAIR AND SUBACROMIAL DECOMPRESSION Left 11/19/2014   Procedure: LEFT SHOULDER ARTHROSCOPY, DEBRIDEMENT DISTAL CLAVICLE EXCISION, ACROMIOPLASTY WITH ROTATOR CUFF REPAIR ;  Surgeon: Kathryne Hitch, MD;  Location: [place];  Service: Orthopedics;  Laterality: Left;  [name] VIDEO ASSISTED THORACOSCOPY (VATS)/DECORTICATION Right 11/21/2011   drainage of empyema    There were no vitals filed for this visit.       Subjective Assessment - 08/10/16 1502    Subjective He report LT shoulder RTC revision/repair.   He reports he has been working on rehab himself.      Limitations --  He can't lift arm , needs other arm asssits.    How long can you sit comfortably? NA    How long can you stand comfortably? NA   How long can you walk comfortably? NA   Diagnostic tests xray MD still concerned   Patient Stated Goals He wants to be able to use LT rm better   Currently in Pain? Yes   Pain Score 4    Pain Location Shoulder   Pain Orientation Left   Pain Descriptors / Indicators Aching   Pain Type Chronic pain   Pain Onset More than a month ago   Pain Frequency Constant   Aggravating Factors  using arm more   Pain Relieving Factors rest, , cold, heat   Multiple Pain Sites No            [facility] PT Assessment - 08/10/16 0001      Assessment   Medical Diagnosis LT RTC repair   Referring Provider Kathryne Hitch, MD   Onset Date/Surgical Date 05/28/16   Hand Dominance Left   Next MD Visit 08/28/16   Prior Therapy No  Precautions   Precaution Comments PROTOCOL     Restrictions   Weight Bearing Restrictions No     Balance Screen   Has the patient fallen in the past 6 months Yes   How many times? 1  chipped bone in foot after dizzy   Has the patient had a decrease in activity level because of a fear of falling?  No   Is the patient reluctant to leave their home because of a fear of falling?  No     Home [employer] residence   Living Arrangements Alone     Prior Function   Level of Independence Independent     Cognition   Overall Cognitive Status Within Functional Limits for tasks assessed     Posture/Postural Control   Posture Comments mild rounded shoulders  bilaterally     ROM / Strength   AROM / PROM / Strength AROM;PROM;Strength     AROM   AROM Assessment Site Shoulder   Right/Left Shoulder Right;Left   Right Shoulder Extension 60 Degrees   Right Shoulder Flexion 145 Degrees   Right Shoulder ABduction 150 Degrees   Right Shoulder Internal Rotation 45 Degrees   Right Shoulder External Rotation 90 Degrees   Right Shoulder Horizontal ABduction 15 Degrees   Right Shoulder Horizontal  ADduction 75 Degrees   Left Shoulder Extension 63 Degrees   Left Shoulder Flexion 25 Degrees   Left Shoulder ABduction 50 Degrees   Left Shoulder Internal Rotation 45 Degrees   Left Shoulder External Rotation -5 Degrees     PROM   PROM Assessment Site Shoulder   Right/Left Shoulder Left   Left Shoulder Flexion 142 Degrees   Left Shoulder ABduction 146 Degrees   Left Shoulder Internal Rotation 45 Degrees   Left Shoulder External Rotation 90 Degrees   Left Shoulder Horizontal ABduction 10 Degrees   Left Shoulder Horizontal ADduction 100 Degrees     Strength   Strength Assessment Site Shoulder   Right/Left Shoulder Right;Left   Right Shoulder Flexion 2/5   Right Shoulder Extension 4/5   Right Shoulder ABduction 2/5   Right Shoulder Internal Rotation 2+/5   Right Shoulder External Rotation 2/5                   [facility] Adult PT Treatment/Exercise - 08/10/16 0001      Exercises   Exercises Shoulder     Shoulder Exercises: Seated   External Rotation Limitations assisted x 15    Flexion Limitations assissted x 15     Shoulder Exercises: Standing   Other Standing Exercises UE ranger over head and side to side  x20 reps  with increase range setting  by PT      Shoulder Exercises: Pulleys   Flexion --  30 reps   ABduction --  30 reps     Shoulder Exercises: ROM/Strengthening   UBE (Upper Arm Bike) L1 3 min forward 3 min back   Modified Plank --   Modified Plank Limitations --                  PT Short Term Goals -  08/10/16 1456      PT SHORT TERM GOAL #1   Title He will be independent with inital HEP   Time 3   Period Weeks   Status New           PT Short Term Goals - 08/10/16 1456      PT SHORT  TERM GOAL #1   Title He will be independent with inital HEP   Time 3   Period Weeks   Status New     PT SHORT TERM GOAL #2   Title Shoulder pain improved 40% with use.   Time 3   Period Weeks   Status New     PT SHORT TERM GOAL #3   Title He will be able to lift arm to 110 degrees or more without weight.    Time 3   Period Weeks   Status New     PT SHORT TERM GOAL #4   Title He will report improvement in dressing and self care with involved arm   Time 3   Period Weeks   Status New     PT SHORT TERM GOAL #5   Title he will be able to place 1-2 pounds on lowest upper cabinet shelf           PT Long Term Goals - 08/10/16 1456      PT LONG TERM GOAL #1   Title He will be  independent with all HEP issued   Time 6   Period Weeks   Status New            PT Long Term Goals - 08/10/16 1456      PT LONG TERM GOAL #1   Title He will be  independent with all HEP issued   Time 6   Period Weeks   Status New     PT LONG TERM GOAL #2   Title FOTO improved to < 40% limited   Time 6   Period Weeks   Status New     PT LONG TERM GOAL #3   Title He will be able to reach into upper cabinets with 2-3 pounds   Time 6   Period Weeks   Status New     PT LONG TERM GOAL #4   Title He will report 2/10 max poain with use of arm   Time 6   Period Weeks   Status New     PT LONG TERM GOAL #5   Title He will report indepenedence with self care without compensation .   Time 6   Period Weeks   Status New             Plan - 08/10/16 1454    Clinical Impression Statement Pt presents for low complexity eval post LT RRTC repair now 10+ weeks post surgery.     Rehab Potential Good   PT Frequency 2x / week   PT Duration 6 weeks   PT Treatment/Interventions Cryotherapy;Moist  Heat;Passive range of motion;Patient/family education;Therapeutic activities;Therapeutic exercise;Taping;Manual techniques   Consulted and Agree with Plan of Care Patient      Patient will benefit from skilled therapeutic intervention in order to improve the following deficits and impairments:  Decreased range of motion, Impaired UE functional use, Pain, Decreased activity tolerance, Decreased strength  Visit Diagnosis: S/P left rotator cuff repair - Plan: PT plan of care cert/re-cert  Left shoulder pain, unspecified chronicity - Plan: PT plan of care cert/re-cert  Stiffness of right shoulder, not elsewhere classified - Plan: PT plan of care cert/re-cert  Muscle weakness (generalized) - Plan: PT plan of care cert/re-cert      G-Codes - [id number]    Functional Assessment Tool Used FOTO 59% limited   Functional Limitation Carrying, moving and handling objects   Carrying, Moving and Handling Objects Current Status (  [id number]) At least 66 percent but less than 60 percent impaired, limited or restricted   Carrying, Moving and Handling Objects Goal Status [id number]) At least 20 percent but less than 40 percent impaired, limited or restricted       Problem List Patient Active Problem List   Diagnosis Date Noted  . 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 Left shoulder pain 12/16/2015  . Transaminitis 12/16/2015  . Chest pain 12/09/2015  . AKI (acute kidney injury) ([place]) 12/09/2015  . Sinus bradycardia   . Lightheadedness 12/08/2015  . Need for prophylactic vaccination and inoculation against influenza 06/02/2015  . Insomnia 06/01/2015  . Rotator cuff tear 11/04/2014  . Injection site reaction 09/02/2014  . Back muscle spasm 05/11/2014  . Bilateral shoulder pain 08/20/2012  . Tobacco abuse 08/20/2012  . Anemia 08/06/2012  . Dizziness and giddiness, h/o 08/06/2012  . HTN (hypertension) 04/20/2011  . BACK PAIN 10/29/2009  . HLD (hyperlipidemia) 10/08/2009  . Systemic lupus erythematosus  ([place]) 08/11/2009  . Rheumatoid arthritis ([place]) 08/11/2009    Darrel Hoover PT 08/10/2016, 4:16 PM  [facility] [address] [place], [place], [address] Phone: [phone]   Fax:  [phone]  Name: [name] [name] MRN: [id number] Date of Birth: Mar 16, 1961

## 2016-08-15 ENCOUNTER — Ambulatory Visit: Payer: Medicare Other | Admitting: Physical Therapy

## 2016-08-17 ENCOUNTER — Ambulatory Visit: Payer: Medicare Other | Admitting: Physical Therapy

## 2016-08-22 ENCOUNTER — Ambulatory Visit: Payer: Medicare Other | Admitting: Physical Therapy

## 2016-08-22 DIAGNOSIS — Z9889 Other specified postprocedural states: Secondary | ICD-10-CM | POA: Diagnosis not present

## 2016-08-22 DIAGNOSIS — M25512 Pain in left shoulder: Secondary | ICD-10-CM

## 2016-08-22 DIAGNOSIS — M25611 Stiffness of right shoulder, not elsewhere classified: Secondary | ICD-10-CM | POA: Diagnosis not present

## 2016-08-22 DIAGNOSIS — M6281 Muscle weakness (generalized): Secondary | ICD-10-CM

## 2016-08-22 NOTE — Therapy (Addendum)
[place], [place], [address] Phone: [phone]   Fax:  [phone]  Physical Therapy Treatment/Discharge  Patient Details  Name: [name] [name] MRN: [id number] Date of Birth: 1960-10-16 Referring Provider: Kathryne Hitch, MD  Encounter Date: 08/22/2016      PT End of Session - 08/22/16 1324    Visit Number 2   Number of Visits 12   Date for PT Re-Evaluation 09/22/16   Authorization Type [employer] M[employer]   PT Start Time 1330   PT Stop Time 1412   PT Time Calculation (min) 42 min      Past Medical History:  Diagnosis Date  . Anemia   . GERD (gastroesophageal reflux disease)   . Heart murmur   . History of anemia    no current med.  . Hypertension    states is borderline on medication; has been on med. x 5-6 yr.  . Impingement syndrome of shoulder region 10/2014   left  . Lupus   . Pneumonia   . RA (rheumatoid arthritis) ([place])     Past Surgical History:  Procedure Laterality Date  . ACHILLES TENDON SURGERY Bilateral   . OSTEOTOMY AND ULNAR SHORTENING Right 07/22/2002  . SHOULDER ARTHROSCOPY WITH BICEPSTENOTOMY Right 03/11/2015   Procedure: SHOULDER ARTHROSCOPY WITH BICEPSTENOTOMY;  Surgeon: Ninetta Lights, MD;  Location: [place];  Service: Orthopedics;  Laterality: Right;  . SHOULDER ARTHROSCOPY WITH DISTAL CLAVICLE RESECTION Left 11/19/2014   Procedure: SHOULDER ARTHROSCOPY WITH DISTAL CLAVICLE RESECTION;  Surgeon: Kathryne Hitch, MD;  Location: [place];  Service: Orthopedics;  Laterality: Left;  . SHOULDER ARTHROSCOPY WITH ROTATOR CUFF REPAIR AND SUBACROMIAL DECOMPRESSION Left 11/19/2014   Procedure: LEFT SHOULDER ARTHROSCOPY, DEBRIDEMENT DISTAL CLAVICLE EXCISION, ACROMIOPLASTY WITH ROTATOR CUFF REPAIR ;  Surgeon: Kathryne Hitch, MD;  Location: [place];  Service: Orthopedics;  Laterality: Left;  [name] VIDEO ASSISTED THORACOSCOPY (VATS)/DECORTICATION Right  11/21/2011   drainage of empyema    There were no vitals filed for this visit.      Subjective Assessment - 08/22/16 1331    Subjective Ive been doing the exercise. It seems like I should be able to do more.    Currently in Pain? Yes   Pain Score 7    Pain Location Shoulder   Pain Orientation Left   Pain Descriptors / Indicators Aching   Pain Frequency Constant   Aggravating Factors  lifting arm    Pain Relieving Factors rest                          [facility] Adult PT Treatment/Exercise - 08/22/16 0001      Shoulder Exercises: Supine   Protraction 10 reps   Other Supine Exercises 90 degrees rhythmic stabilization   Other Supine Exercises supine can press ups, pullovers 90 and above      Shoulder Exercises: Seated   External Rotation Limitations table slides, sliding pillow case    Flexion Limitations assissted x 15  cane      Shoulder Exercises: Standing   Row 15 reps   Theraband Level (Shoulder Row) Level 2 (Red)   Other Standing Exercises UE ranger on floor focusing to decrease shoulder hike      Shoulder Exercises: Isometric Strengthening   Flexion 5X10"   Flexion Limitations max cues   Extension 5X10"   External Rotation 5X10"   External Rotation Limitations max cues   Internal Rotation 5X10"  max cues                 PT Education - 08/22/16 1329    Education provided Yes   Education Details POC   Person(s) Educated Patient   Methods Explanation   Comprehension Verbalized understanding          PT Short Term Goals - 08/10/16 1456      PT SHORT TERM GOAL #1   Title He will be independent with inital HEP   Time 3   Period Weeks   Status New     PT SHORT TERM GOAL #2   Title Shoulder pain improved 40% with use.   Time 3   Period Weeks   Status New     PT SHORT TERM GOAL #3   Title He will be able to lift arm to 110 degrees or more without weight.    Time 3   Period Weeks   Status New     PT SHORT TERM GOAL #4    Title He will report improvement in dressing and self care with involved arm   Time 3   Period Weeks   Status New     PT SHORT TERM GOAL #5   Title he will be able to place 1-2 pounds on lowest upper cabinet shelf            PT Long Term Goals - 08/10/16 1456      PT LONG TERM GOAL #1   Title He will be  independent with all HEP issued   Time 6   Period Weeks   Status New     PT LONG TERM GOAL #2   Title FOTO improved to < 40% limited   Time 6   Period Weeks   Status New     PT LONG TERM GOAL #3   Title He will be able to reach into upper cabinets with 2-3 pounds   Time 6   Period Weeks   Status New     PT LONG TERM GOAL #4   Title He will report 2/10 max poain with use of arm   Time 6   Period Weeks   Status New     PT LONG TERM GOAL #5   Title He will report indepenedence with self care without compensation .   Time 6   Period Weeks   Status New               Plan - 08/22/16 1420    Clinical Impression Statement Pt presents with significant left shoulder hike at rest. Max cues throughout treatment to depress shoulder. Pt given green band shoulder sling for scapular depression HEP. Began isometrics with pt demonstrating poor form so did not add to HEP. He can move into external rotaion in gracity eliminated position so given ER table slides for HEP as welll. Supine, pt requires assist to get to 90 degrees and then he is able to perform AROM at shoulder height and above woithout assist.    PT Next Visit Plan ROM /Strengthening , modalities as needed; scapular stability, review scap depression with green band and ER table slides; retry isometrics, maybe can try supine scap bands.    PT Home Exercise Plan (Initial Eval: given wall slides) added scap depression with green band and ER table slides   Consulted and Agree with Plan of Care Patient      Patient will benefit from skilled therapeutic intervention in order to improve  the following deficits and  impairments:  Decreased range of motion, Impaired UE functional use, Pain, Decreased activity tolerance, Decreased strength  Visit Diagnosis: S/P left rotator cuff repair  Left shoulder pain, unspecified chronicity  Stiffness of right shoulder, not elsewhere classified  Muscle weakness (generalized)     Problem List Patient Active Problem List   Diagnosis Date Noted  . 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 Left shoulder pain 12/16/2015  . Transaminitis 12/16/2015  . Chest pain 12/09/2015  . AKI (acute kidney injury) ([place]) 12/09/2015  . Sinus bradycardia   . Lightheadedness 12/08/2015  . Need for prophylactic vaccination and inoculation against influenza 06/02/2015  . Insomnia 06/01/2015  . Rotator cuff tear 11/04/2014  . Injection site reaction 09/02/2014  . Back muscle spasm 05/11/2014  . Bilateral shoulder pain 08/20/2012  . Tobacco abuse 08/20/2012  . Anemia 08/06/2012  . Dizziness and giddiness, h/o 08/06/2012  . HTN (hypertension) 04/20/2011  . BACK PAIN 10/29/2009  . HLD (hyperlipidemia) 10/08/2009  . Systemic lupus erythematosus ([place]) 08/11/2009  . Rheumatoid arthritis ([place]) 08/11/2009    Dorene Ar, PTA 08/22/2016, 2:32 PM  [facility] [address] [place], [place], [address] Phone: [phone]   Fax:  [phone]  Name: Laken Lobato MRN: [id number] Date of Birth: 1961-01-01  PHYSICAL THERAPY DISCHARGE SUMMARY  Visits from Start of Care: 2  Current functional level related to goals / functional outcomes: See above . Unknown as he did not return after this visit   Remaining deficits: See above   Education / Equipment: HEP Plan:                                                    Patient goals were not met. Patient is being discharged due to not returning since the last visit.  ?????    Noralee Stain  10/30/16   9:37 AM

## 2016-08-22 NOTE — Patient Instructions (Signed)
Shoulder AAROM external rotation slides sitting at a table and sliding towel in and out. Keep elbow at side. 20 reps, keep shoulder down, 2 times per day  Place green band in sling-like position around neck and left elbow. Depress scapula and hold 5 sec, slowly relax, repeat 20 reps, 2 times per day.

## 2016-08-24 ENCOUNTER — Ambulatory Visit: Payer: Medicare Other | Admitting: Physical Therapy

## 2016-08-28 DIAGNOSIS — M545 Low back pain: Secondary | ICD-10-CM | POA: Diagnosis not present

## 2016-08-28 DIAGNOSIS — M5137 Other intervertebral disc degeneration, lumbosacral region: Secondary | ICD-10-CM | POA: Diagnosis not present

## 2016-08-28 DIAGNOSIS — M79606 Pain in leg, unspecified: Secondary | ICD-10-CM | POA: Diagnosis not present

## 2016-08-28 DIAGNOSIS — M47817 Spondylosis without myelopathy or radiculopathy, lumbosacral region: Secondary | ICD-10-CM | POA: Diagnosis not present

## 2016-08-28 DIAGNOSIS — M25572 Pain in left ankle and joints of left foot: Secondary | ICD-10-CM | POA: Diagnosis not present

## 2016-08-28 DIAGNOSIS — Z79899 Other long term (current) drug therapy: Secondary | ICD-10-CM | POA: Diagnosis not present

## 2016-08-28 DIAGNOSIS — G894 Chronic pain syndrome: Secondary | ICD-10-CM | POA: Diagnosis not present

## 2016-08-28 DIAGNOSIS — Z79891 Long term (current) use of opiate analgesic: Secondary | ICD-10-CM | POA: Diagnosis not present

## 2016-08-29 ENCOUNTER — Ambulatory Visit: Payer: Medicare Other | Admitting: Physical Therapy

## 2016-08-31 ENCOUNTER — Ambulatory Visit: Payer: Medicare Other | Attending: Orthopedic Surgery | Admitting: Physical Therapy

## 2016-09-04 ENCOUNTER — Other Ambulatory Visit: Payer: Self-pay | Admitting: Internal Medicine

## 2016-09-04 NOTE — Telephone Encounter (Signed)
Based on last clinic visit he was controlled on monotherapy without a strong indication to restart bystolic. I will reassess at the upcoming clinic appointment Friday if he actually needs to take this medication.

## 2016-09-05 ENCOUNTER — Ambulatory Visit: Payer: Medicare Other

## 2016-09-07 ENCOUNTER — Ambulatory Visit: Payer: Medicare Other | Admitting: Physical Therapy

## 2016-09-07 DIAGNOSIS — M25572 Pain in left ankle and joints of left foot: Secondary | ICD-10-CM | POA: Diagnosis not present

## 2016-09-08 ENCOUNTER — Ambulatory Visit (INDEPENDENT_AMBULATORY_CARE_PROVIDER_SITE_OTHER): Payer: Medicare Other | Admitting: Internal Medicine

## 2016-09-08 ENCOUNTER — Encounter: Payer: Self-pay | Admitting: Internal Medicine

## 2016-09-08 VITALS — BP 158/105 | HR 84 | Temp 97.9°F | Ht 68.0 in | Wt 182.8 lb

## 2016-09-08 DIAGNOSIS — Z1211 Encounter for screening for malignant neoplasm of colon: Secondary | ICD-10-CM

## 2016-09-08 DIAGNOSIS — I10 Essential (primary) hypertension: Secondary | ICD-10-CM

## 2016-09-08 DIAGNOSIS — M069 Rheumatoid arthritis, unspecified: Secondary | ICD-10-CM

## 2016-09-08 DIAGNOSIS — Z1212 Encounter for screening for malignant neoplasm of rectum: Secondary | ICD-10-CM

## 2016-09-08 DIAGNOSIS — Z720 Tobacco use: Secondary | ICD-10-CM

## 2016-09-08 DIAGNOSIS — G8929 Other chronic pain: Secondary | ICD-10-CM

## 2016-09-08 DIAGNOSIS — M25512 Pain in left shoulder: Secondary | ICD-10-CM

## 2016-09-08 DIAGNOSIS — M0579 Rheumatoid arthritis with rheumatoid factor of multiple sites without organ or systems involvement: Secondary | ICD-10-CM

## 2016-09-08 DIAGNOSIS — Z79891 Long term (current) use of opiate analgesic: Secondary | ICD-10-CM

## 2016-09-08 DIAGNOSIS — M25511 Pain in right shoulder: Secondary | ICD-10-CM

## 2016-09-08 DIAGNOSIS — Z79899 Other long term (current) drug therapy: Secondary | ICD-10-CM

## 2016-09-08 DIAGNOSIS — F17210 Nicotine dependence, cigarettes, uncomplicated: Secondary | ICD-10-CM | POA: Diagnosis not present

## 2016-09-08 MED ORDER — PRAVASTATIN SODIUM 40 MG PO TABS: 40.0000 mg | ORAL_TABLET | Freq: Every evening | ORAL | 1 refills | Status: DC

## 2016-09-08 MED ORDER — MELOXICAM 10 MG PO CAPS: 10.0000 mg | ORAL_CAPSULE | Freq: Every day | ORAL | 1 refills | Status: DC | PRN

## 2016-09-08 MED ORDER — AMLODIPINE BESYLATE 10 MG PO TABS: 10.0000 mg | ORAL_TABLET | Freq: Every day | ORAL | 1 refills | Status: DC

## 2016-09-08 NOTE — Progress Notes (Signed)
   CC: Follow up for hypertension and medication refills  HPI:  [name] is a 56 y.o. here today without acute complaints requesting medication refill for his hypertension.   See problem based assessment and plan below for additional details  Past Medical History:  Diagnosis Date  . Anemia   . GERD (gastroesophageal reflux disease)   . Heart murmur   . History of anemia    no current med.  . Hypertension    states is borderline on medication; has been on med. x 5-6 yr.  . Impingement syndrome of shoulder region 10/2014   left  . Lupus   . Pneumonia   . RA (rheumatoid arthritis) ([place])     Review of Systems:  Review of Systems  Constitutional: Negative for fever.  Respiratory: Negative for cough and sputum production.   Cardiovascular: Negative for chest pain and leg swelling.  Gastrointestinal: Negative for blood in stool, diarrhea and melena.  Musculoskeletal: Positive for joint pain.  Neurological: Negative for sensory change.    Physical Exam: Physical Exam  Constitutional: He is well-developed, well-nourished, and in no distress.  HENT:  Head: Normocephalic and atraumatic.  Cardiovascular: Normal rate and regular rhythm.   Pulmonary/Chest: Effort normal and breath sounds normal.  Musculoskeletal: He exhibits no edema.  Full active ROM in both shoulders with 5/5 strength in proximal and distal upper extremities Mild MCP subluxation with lateral deviation appreciated in both hands, without prominent synovial swelling  Psychiatric: Affect normal.    Vitals:   09/08/16 1322  BP: (!) 158/105  Pulse: 84  Temp: 97.9 F (36.6 C)  TempSrc: Oral  SpO2: 99%  Weight: 182 lb 12.8 oz (82.9 kg)  Height: 5\' 8"  (1.727 m)    Assessment & Plan:   See Encounters Tab for problem based charting.  Patient discussed with Dr. Angelia Mould

## 2016-09-08 NOTE — Patient Instructions (Signed)
It was a pleasure to see you today [name] [name].  I would like you to resume taking your amlodipine 10mg  once daily for blood pressure. I do not think we need to start a new drug at this time.  I sent a refill for meloxicam to take once daily as needed for arthritis pain.  I am glad to hear you are doing well reducing your total smoking since this is also important for heart health and bone health.

## 2016-09-11 ENCOUNTER — Telehealth: Payer: Self-pay

## 2016-09-11 ENCOUNTER — Ambulatory Visit: Payer: Medicare Other

## 2016-09-11 NOTE — Assessment & Plan Note (Signed)
He was agreeable to FOBT stool cards for CRC screening today. These were provided as well as directions for use.

## 2016-09-11 NOTE — Assessment & Plan Note (Signed)
He underwent surgical fixation the left shoulder several months ago with good improvement in his pain and function in that limb. Reports the surgeon discussed mild displacement after his previous arthroscopy in the shoulder which was corrected.

## 2016-09-11 NOTE — Progress Notes (Signed)
Internal Medicine Clinic Attending  Case discussed with Dr. Rice at the time of the visit.  We reviewed the resident's history and exam and pertinent patient test results.  I agree with the assessment, diagnosis, and plan of care documented in the resident's note.  

## 2016-09-11 NOTE — Assessment & Plan Note (Signed)
HPI: He continues to have moderate amounts of joint stiffness and pain but not severe enough to prevent working and an desired routine activities. He reports good improvement in his joint pain with daily meloxicam and requests refill of this medication. He is also seen at pain management clinic where he takes Percocet 5 mg 4-6 times daily for chronic joint pain. He is not on any disease modifying antirheumatic drugs but does have an upcoming appointment scheduled on April 5 with [facility].  A: Frequently symptomatic RA not currently on disease specific therapy Given his moderate chronic opiate use for joint pains suspect there are significant symptoms going on  P: I will order meloxicam once daily as needed for his arthritis pain Strongly encouraged him to follow-up in April with Dr. Trudie Reed

## 2016-09-11 NOTE — Telephone Encounter (Signed)
[name] [name] was called about today's no show and there was no answer. His voice mail box was full so no message was left.

## 2016-09-11 NOTE — Assessment & Plan Note (Signed)
HPI: [name] [name] has decreased his smoking from 2-3 packs a day in late 2016 down to about one half pack per day at this time. He attributes this to decreased stress which is the main trigger for him to smoke. He is not interested in pharmacotherapy to assist smoking cessation at this time and thinks he is making progress on his own  A: Continued tobacco abuse with some reduction on his own but not interested in additional therapies for cessation at this time. He is at very high risk for early cardiovascular disease with continued smoking and inadequately controlled rheumatic disease at this time  P: Encouraged his own reduction of smoking

## 2016-09-11 NOTE — Assessment & Plan Note (Signed)
HPI: He has been doing well on amlodipine 10mg  daily but has run out of this in the past week. He reports improvement in his previous mild lower extremity edema while taking this medication.  A: Blood pressure is mildly uncontrolled today with a target less than 140/90 This is while off any antihypertensive medication so I think resuming his previous dose is an adequate change to get control  P: Reordered amlodipine 10mg  once daily

## 2016-09-20 ENCOUNTER — Telehealth: Payer: Self-pay | Admitting: *Deleted

## 2016-09-20 MED ORDER — MELOXICAM 15 MG PO TABS: 15.0000 mg | ORAL_TABLET | Freq: Every day | ORAL | 2 refills | Status: DC

## 2016-09-20 NOTE — Telephone Encounter (Signed)
Call to [place] for Vivlodex alternatives as Vivlodex was denied and not on the formulary.  Meloxicam, Lbuprofen, Flurbiprofen and Naproxsyn are on the formulary.  Message to be sent to Dr. Benjamine Mola.  Sander Nephew, RN 09/20/2016 9:50 AM.

## 2016-09-20 NOTE — Telephone Encounter (Signed)
Meloxicam should be an appropriate generic alternative to vivlodex considering he has pain due to rheumatoid arthritis and osteoarthritis. Prescription placed to [facility] for meloxicam 15mg  once daily by mouth #30 tablets w/ 2 refills. Please let me know if this requires additional information for ordering or PA.

## 2016-09-26 DIAGNOSIS — M47817 Spondylosis without myelopathy or radiculopathy, lumbosacral region: Secondary | ICD-10-CM | POA: Diagnosis not present

## 2016-09-26 DIAGNOSIS — M5137 Other intervertebral disc degeneration, lumbosacral region: Secondary | ICD-10-CM | POA: Diagnosis not present

## 2016-09-26 DIAGNOSIS — G894 Chronic pain syndrome: Secondary | ICD-10-CM | POA: Diagnosis not present

## 2016-09-26 DIAGNOSIS — Z79899 Other long term (current) drug therapy: Secondary | ICD-10-CM | POA: Diagnosis not present

## 2016-09-26 DIAGNOSIS — Z79891 Long term (current) use of opiate analgesic: Secondary | ICD-10-CM | POA: Diagnosis not present

## 2016-09-26 DIAGNOSIS — M25579 Pain in unspecified ankle and joints of unspecified foot: Secondary | ICD-10-CM | POA: Diagnosis not present

## 2016-09-28 DIAGNOSIS — R7989 Other specified abnormal findings of blood chemistry: Secondary | ICD-10-CM | POA: Diagnosis not present

## 2016-09-28 DIAGNOSIS — R5383 Other fatigue: Secondary | ICD-10-CM | POA: Diagnosis not present

## 2016-09-28 DIAGNOSIS — G894 Chronic pain syndrome: Secondary | ICD-10-CM | POA: Diagnosis not present

## 2016-09-28 DIAGNOSIS — M0579 Rheumatoid arthritis with rheumatoid factor of multiple sites without organ or systems involvement: Secondary | ICD-10-CM | POA: Diagnosis not present

## 2016-09-28 DIAGNOSIS — Z9119 Patient's noncompliance with other medical treatment and regimen: Secondary | ICD-10-CM | POA: Diagnosis not present

## 2016-09-28 DIAGNOSIS — M150 Primary generalized (osteo)arthritis: Secondary | ICD-10-CM | POA: Diagnosis not present

## 2016-10-05 DIAGNOSIS — M19079 Primary osteoarthritis, unspecified ankle and foot: Secondary | ICD-10-CM | POA: Diagnosis not present

## 2016-10-24 DIAGNOSIS — G894 Chronic pain syndrome: Secondary | ICD-10-CM | POA: Diagnosis not present

## 2016-10-24 DIAGNOSIS — M25519 Pain in unspecified shoulder: Secondary | ICD-10-CM | POA: Diagnosis not present

## 2016-10-24 DIAGNOSIS — M25579 Pain in unspecified ankle and joints of unspecified foot: Secondary | ICD-10-CM | POA: Diagnosis not present

## 2016-10-24 DIAGNOSIS — M5137 Other intervertebral disc degeneration, lumbosacral region: Secondary | ICD-10-CM | POA: Diagnosis not present

## 2016-10-30 DIAGNOSIS — M7060 Trochanteric bursitis, unspecified hip: Secondary | ICD-10-CM | POA: Diagnosis not present

## 2016-10-30 DIAGNOSIS — M25559 Pain in unspecified hip: Secondary | ICD-10-CM | POA: Diagnosis not present

## 2016-10-30 DIAGNOSIS — M545 Low back pain: Secondary | ICD-10-CM | POA: Diagnosis not present

## 2016-11-02 DIAGNOSIS — M7060 Trochanteric bursitis, unspecified hip: Secondary | ICD-10-CM | POA: Diagnosis not present

## 2016-11-22 DIAGNOSIS — M7060 Trochanteric bursitis, unspecified hip: Secondary | ICD-10-CM | POA: Diagnosis not present

## 2016-11-24 DIAGNOSIS — M25559 Pain in unspecified hip: Secondary | ICD-10-CM | POA: Diagnosis not present

## 2016-11-24 DIAGNOSIS — G894 Chronic pain syndrome: Secondary | ICD-10-CM | POA: Diagnosis not present

## 2016-11-24 DIAGNOSIS — M25519 Pain in unspecified shoulder: Secondary | ICD-10-CM | POA: Diagnosis not present

## 2016-11-24 DIAGNOSIS — Z79891 Long term (current) use of opiate analgesic: Secondary | ICD-10-CM | POA: Diagnosis not present

## 2016-11-24 DIAGNOSIS — M25579 Pain in unspecified ankle and joints of unspecified foot: Secondary | ICD-10-CM | POA: Diagnosis not present

## 2016-11-24 DIAGNOSIS — Z79899 Other long term (current) drug therapy: Secondary | ICD-10-CM | POA: Diagnosis not present

## 2016-11-29 DIAGNOSIS — M47817 Spondylosis without myelopathy or radiculopathy, lumbosacral region: Secondary | ICD-10-CM | POA: Diagnosis not present

## 2016-12-13 DIAGNOSIS — G894 Chronic pain syndrome: Secondary | ICD-10-CM | POA: Diagnosis not present

## 2016-12-13 DIAGNOSIS — M47817 Spondylosis without myelopathy or radiculopathy, lumbosacral region: Secondary | ICD-10-CM | POA: Diagnosis not present

## 2016-12-13 DIAGNOSIS — M545 Low back pain: Secondary | ICD-10-CM | POA: Diagnosis not present

## 2016-12-18 ENCOUNTER — Encounter: Payer: Self-pay | Admitting: Cardiology

## 2016-12-22 DIAGNOSIS — G894 Chronic pain syndrome: Secondary | ICD-10-CM | POA: Diagnosis not present

## 2016-12-22 DIAGNOSIS — M47817 Spondylosis without myelopathy or radiculopathy, lumbosacral region: Secondary | ICD-10-CM | POA: Diagnosis not present

## 2016-12-22 DIAGNOSIS — M25519 Pain in unspecified shoulder: Secondary | ICD-10-CM | POA: Diagnosis not present

## 2016-12-22 DIAGNOSIS — M25559 Pain in unspecified hip: Secondary | ICD-10-CM | POA: Diagnosis not present

## 2017-01-02 ENCOUNTER — Other Ambulatory Visit: Payer: Self-pay | Admitting: *Deleted

## 2017-01-02 ENCOUNTER — Emergency Department (HOSPITAL_COMMUNITY): Payer: Medicare Other

## 2017-01-02 ENCOUNTER — Encounter (HOSPITAL_COMMUNITY): Payer: Self-pay | Admitting: Vascular Surgery

## 2017-01-02 ENCOUNTER — Emergency Department (HOSPITAL_COMMUNITY)
Admission: EM | Admit: 2017-01-02 | Discharge: 2017-01-02 | Disposition: A | Discharge status: Home / Self Care | Payer: Medicare Other | Attending: Emergency Medicine | Admitting: Emergency Medicine

## 2017-01-02 DIAGNOSIS — R05 Cough: Secondary | ICD-10-CM | POA: Diagnosis not present

## 2017-01-02 DIAGNOSIS — Z79899 Other long term (current) drug therapy: Secondary | ICD-10-CM | POA: Diagnosis not present

## 2017-01-02 DIAGNOSIS — F17210 Nicotine dependence, cigarettes, uncomplicated: Secondary | ICD-10-CM | POA: Diagnosis not present

## 2017-01-02 DIAGNOSIS — Z7982 Long term (current) use of aspirin: Secondary | ICD-10-CM | POA: Insufficient documentation

## 2017-01-02 DIAGNOSIS — H5789 Other specified disorders of eye and adnexa: Secondary | ICD-10-CM

## 2017-01-02 DIAGNOSIS — Z7902 Long term (current) use of antithrombotics/antiplatelets: Secondary | ICD-10-CM | POA: Insufficient documentation

## 2017-01-02 DIAGNOSIS — H5712 Ocular pain, left eye: Secondary | ICD-10-CM | POA: Diagnosis present

## 2017-01-02 DIAGNOSIS — H578 Other specified disorders of eye and adnexa: Secondary | ICD-10-CM | POA: Diagnosis not present

## 2017-01-02 DIAGNOSIS — I10 Essential (primary) hypertension: Secondary | ICD-10-CM | POA: Diagnosis not present

## 2017-01-02 DIAGNOSIS — R0602 Shortness of breath: Secondary | ICD-10-CM | POA: Diagnosis not present

## 2017-01-02 MED ORDER — FLUORESCEIN SODIUM 0.6 MG OP STRP
1.0000 | ORAL_STRIP | Freq: Once | OPHTHALMIC | Status: AC
  Administered 2017-01-02: 1 via OPHTHALMIC
  Filled 2017-01-02: qty 1

## 2017-01-02 MED ORDER — OXYCODONE-ACETAMINOPHEN 5-325 MG PO TABS
1.0000 | ORAL_TABLET | Freq: Once | ORAL | Status: AC
  Administered 2017-01-02: 1 via ORAL
  Filled 2017-01-02: qty 1

## 2017-01-02 MED ORDER — ERYTHROMYCIN 5 MG/GM OP OINT: TOPICAL_OINTMENT | OPHTHALMIC | 0 refills | Status: DC

## 2017-01-02 MED ORDER — TETRACAINE HCL 0.5 % OP SOLN
1.0000 [drp] | Freq: Once | OPHTHALMIC | Status: AC
  Administered 2017-01-02: 1 [drp] via OPHTHALMIC
  Filled 2017-01-02: qty 4

## 2017-01-02 NOTE — ED Provider Notes (Signed)
[facility] DEPT Provider Note   CSN: [id number] Arrival date & time: 01/02/17  1248  By signing my name below, I, Sonum Patel, attest that this documentation has been prepared under the direction and in the presence of [employer]. Electronically Signed: Sonum Patel, Scribe. 01/02/17. 2:55 PM.  History   Chief Complaint Chief Complaint  Patient presents with  . Eye Pain    The history is provided by the patient. No language interpreter was used.     HPI Comments: [name] [name] is a 56 y.o. male who presents to the Emergency Department complaining of constant, unchanged left eye irritation with associated intermittent drainage that began 5 days ago. He reports yellow drainage and eye matting in the mornings. He also has associated left eye swelling that started on day 2 of this episode. He states the irritation/soreness is worse with blinking. He has blurry vision, Productive cough, and SOB that started today. He used warm compresses with mild relief. He denies known trauma to the affected area. He does not wear contact lens. He denies fever, chills.    Past Medical History:  Diagnosis Date  . Anemia   . GERD (gastroesophageal reflux disease)   . Heart murmur   . History of anemia    no current med.  . Hypertension    states is borderline on medication; has been on med. x 5-6 yr.  . Impingement syndrome of shoulder region 10/2014   left  . Lupus   . Pneumonia   . RA (rheumatoid arthritis) [facility])     Patient Active Problem List   Diagnosis Date Noted  . Screening for colorectal cancer 09/11/2016  . 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 Left shoulder pain 12/16/2015  . Transaminitis 12/16/2015  . Chest pain 12/09/2015  . Lightheadedness 12/08/2015  . Need for prophylactic vaccination and inoculation against influenza 06/02/2015  . Insomnia 06/01/2015  . Rotator cuff tear 11/04/2014  . Injection site reaction 09/02/2014  . Back muscle spasm 05/11/2014  . Bilateral shoulder  pain 08/20/2012  . Tobacco abuse 08/20/2012  . Anemia 08/06/2012  . Dizziness and giddiness, h/o 08/06/2012  . HTN (hypertension) 04/20/2011  . BACK PAIN 10/29/2009  . HLD (hyperlipidemia) 10/08/2009  . Systemic lupus erythematosus ([place]) 08/11/2009  . Rheumatoid arthritis ([place]) 08/11/2009    Past Surgical History:  Procedure Laterality Date  . ACHILLES TENDON SURGERY Bilateral   . OSTEOTOMY AND ULNAR SHORTENING Right 07/22/2002  . SHOULDER ARTHROSCOPY WITH BICEPSTENOTOMY Right 03/11/2015   Procedure: SHOULDER ARTHROSCOPY WITH BICEPSTENOTOMY;  Surgeon: Ninetta Lights, MD;  Location: [facility];  Service: Orthopedics;  Laterality: Right;  . SHOULDER ARTHROSCOPY WITH DISTAL CLAVICLE RESECTION Left 11/19/2014   Procedure: SHOULDER ARTHROSCOPY WITH DISTAL CLAVICLE RESECTION;  Surgeon: Kathryne Hitch, MD;  Location: [facility];  Service: Orthopedics;  Laterality: Left;  . SHOULDER ARTHROSCOPY WITH ROTATOR CUFF REPAIR AND SUBACROMIAL DECOMPRESSION Left 11/19/2014   Procedure: LEFT SHOULDER ARTHROSCOPY, DEBRIDEMENT DISTAL CLAVICLE EXCISION, ACROMIOPLASTY WITH ROTATOR CUFF REPAIR ;  Surgeon: Kathryne Hitch, MD;  Location: [facility];  Service: Orthopedics;  Laterality: Left;  [name] VIDEO ASSISTED THORACOSCOPY (VATS)/DECORTICATION Right 11/21/2011   drainage of empyema       Home Medications    Prior to Admission medications   Medication Sig Start Date End Date Taking? Authorizing Provider  amLODipine (NORVASC) 10 MG tablet Take 1 tablet (10 mg total) by mouth daily. 09/08/16   Collier Salina, MD  aspirin EC 81 MG EC  tablet Take 1 tablet (81 mg total) by mouth daily. 12/10/15   Norman Herrlich, MD  baclofen (LIORESAL) 10 MG tablet Take 10 mg by mouth 2 (two) times daily as needed for muscle spasms.  11/11/15   [provider]  EMBEDA 50-2 MG CPCR Take 1 tablet by mouth daily. 07/17/16   [provider]  ferrous sulfate 325 (65 FE) MG  tablet Take 325 mg by mouth daily with breakfast.    [provider]  meloxicam (MOBIC) 15 MG tablet Take 1 tablet (15 mg total) by mouth daily. 09/20/16   Rice, Resa Miner, MD  nicotine (NICODERM CQ - DOSED IN MG/24 HOURS) 14 mg/24hr patch Place 1 patch (14 mg total) onto the skin daily. Patient not taking: Reported on 01/26/2016 01/07/16   Burgess Estelle, MD  nitroGLYCERIN (NITROLINGUAL) 0.4 MG/SPRAY spray Place 1 spray under the tongue every 5 (five) minutes x 3 doses as needed for chest pain. 12/10/15   Norman Herrlich, MD  ondansetron (ZOFRAN) 4 MG tablet Take 1 tablet (4 mg total) by mouth every 8 (eight) hours as needed for nausea or vomiting. Patient not taking: Reported on 01/26/2016 11/19/14   Aundra Dubin, PA-C  oxyCODONE-acetaminophen (PERCOCET) 10-325 MG tablet Take 1 tablet by mouth daily as needed for pain.  12/08/15   [provider]  pantoprazole (PROTONIX) 40 MG tablet TAKE 1 TABLET BY MOUTH TWICE DAILY 04/27/16   Rice, Resa Miner, MD  pravastatin (PRAVACHOL) 40 MG tablet Take 1 tablet (40 mg total) by mouth every evening. 09/08/16   Collier Salina, MD    Family History Family History  Problem Relation Age of Onset  . Heart disease Father 43  . Stroke Neg Hx   . Cancer Neg Hx     Social History Social History  Substance Use Topics  . Smoking status: Current Every Day Smoker    Packs/day: 0.10    Years: 20.00    Types: Cigarettes  . Smokeless tobacco: Never Used     Comment: 1-2  per cig./day  . Alcohol use No     Allergies   Ramipril [ramipril] and Tramadol   Review of Systems Review of Systems  Constitutional: Negative for chills and fever.  Eyes: Positive for pain, discharge, redness, itching and visual disturbance. Negative for photophobia.  Respiratory: Positive for cough and shortness of breath.      Physical Exam Updated Vital Signs BP (!) 115/94 (BP Location: Left Arm)   Pulse (!) 105   Temp 98.3 F (36.8 C) (Oral)    Resp 16   SpO2 95%   Physical Exam  Constitutional: He appears well-developed and well-nourished. No distress.  HENT:  Head: Normocephalic and atraumatic.  Right Ear: External ear normal.  Left Ear: External ear normal.  Nose: Nose normal.  Mouth/Throat: Oropharynx is clear and moist.  No tenderness to palpation of the maxillary or frontal sinuses. Posterior pharynx is normal.  Eyes: EOM are normal. Pupils are equal, round, and reactive to light.  Left upper lid with swelling and erythema. Nontender to palpation. Left lower lid mildly tender to palpation near the medial canthus. Left eye with injected conjunctiva and chemosis noted. Left eye is tearful. No foreign bodies noted. No purulent drainage noted. No pain with EOMs, no consensual photophobia. On fluorescein stain of the left eye, there is a vertical dendritic lesion on the nasal side of the cornea. No ulcerations, rest rings, or foreign bodies noted.     Visual  Acuity  Right Eye Distance: 20/32 Left Eye Distance: 20/160 Bilateral Distance: 20/50          Neck: No JVD present. No tracheal deviation present.  Cardiovascular: Regular rhythm and normal heart sounds.   Tachycardic  Pulmonary/Chest: Effort normal and breath sounds normal. No respiratory distress. He has no wheezes. He has no rales. He exhibits no tenderness.  Abdominal: He exhibits no distension.  Musculoskeletal: He exhibits no edema.  Neurological: He is alert.  Skin: Skin is warm and dry. No erythema.  Psychiatric: He has a normal mood and affect. His behavior is normal.  Nursing note and vitals reviewed.    ED Treatments / Results  DIAGNOSTIC STUDIES: Oxygen Saturation is 95% on RA, adequate by my interpretation.    COORDINATION OF CARE: 2:53 PM Discussed treatment plan with pt at bedside and pt agreed to plan.  Labs (all labs ordered are listed, but only abnormal results are displayed) Labs Reviewed - No data to display  EKG  EKG  Interpretation None       Radiology Dg Chest 2 View  Result Date: 01/02/2017 CLINICAL DATA:  He has blurry vision, cough, and SOB that started today. Also having left eye pain and drainage. EXAM: CHEST  2 VIEW COMPARISON:  Chest x-ray dated 01/26/2016. FINDINGS: Stable cardiomegaly.  Atherosclerosis at the aortic arch. Probable mild scarring/fibrosis at the lung bases. Lungs otherwise clear. No pleural effusion or pneumothorax seen. Osseous structures about the chest are unremarkable. IMPRESSION: No active cardiopulmonary disease. No evidence of pneumonia or pulmonary edema. Aortic atherosclerosis. Electronically Signed   By: Franki Cabot M.D.   On: 01/02/2017 15:40    Procedures Procedures (including critical care time)  Medications Ordered in ED Medications  fluorescein ophthalmic strip 1 strip (1 strip Left Eye Given 01/02/17 1511)  tetracaine (PONTOCAINE) 0.5 % ophthalmic solution 1 drop (1 drop Left Eye Given 01/02/17 1511)  oxyCODONE-acetaminophen (PERCOCET/ROXICET) 5-325 MG per tablet 1 tablet (1 tablet Oral Given 01/02/17 1645)     Initial Impression / Assessment and Plan / ED Course  I have reviewed the triage vital signs and the nursing notes.  Pertinent labs & imaging results that were available during my care of the patient were reviewed by me and considered in my medical decision making (see chart for details).     Patient with left eye irritation and drainage for 5 days and new onset productive cough for 1 day. Afebrile, no respiratory distress. Chest x-ray reviewed by me shows no active cardiopulmonary disease. Low suspicion of pneumonia, bronchitis, PE, or pulmonary edema. Cough likely related to viral URI. Visual acuity significantly reduced in the left eye. Fluorescein stain shows dendritic lesion. Will obtain ophthalmology consultation and we await their recommendation. Patient seen and evaluated by Dr. Wilson Singer, who agrees with my assessment and plan.   5:20 PM Signed  out to oncoming provider PA [facility]. Awaiting ophthalmology consultation and recommendation.   Final Clinical Impressions(s) / ED Diagnoses   Final diagnoses:  Eye irritation    New Prescriptions New Prescriptions   No medications on file  I personally performed the services described in this documentation, which was scribed in my presence. The recorded information has been reviewed and is accurate.    Renita Papa, PA-C 01/05/17 0911    Virgel Manifold, MD 01/10/17 [phone]

## 2017-01-02 NOTE — Patient Outreach (Signed)
[facility]) Care Management  01/02/2017  [name] [name] Aug 13, 1960 [id number]   CSW was able to make initial contact with patient today to perform CSW screening with [facility] patients with acuity level 4 or 5.  CSW introduced self, explained role and types of services provided through [facility] ([facility]).  CSW further explained to patient that CSW wants to ensure that patient has all his needs met, prior to returning home.  CSW obtained two HIPAA compliant identifiers from patient, which included patient's name and date of birth. The following CSW screening was performed: The reason for patient's visit to the emergency department is due to left eye irritation, which has been matted with yellow drainage for the past 4 mornings.  Patient complains that it feels like there is something in his eye that is scratching his pupil whenever he blinks.  Patient is experiencing some blurred vision but denied blindness in left eye or loss of vision.   Patient denies having first tried calling his Primary Care Physician, Dr. Vernelle [name], nor did patient consider utilizing an urgent care center, prior to appearing in the emergency department.  Patient has a follow-up appointment with his Cardiologist, [name] [name] scheduled for August 20th.  Patient reports he will have transportation to and from this scheduled appointment and that he is able to afford the co-payment.  Patient admits to already having Advanced Directives ([place] documents) in place, and was requested to provide Dr. Benjamine [name] with a copy to scan into his electronic medical record to have on file. CSW noted that patient cancelled 5 physical therapy appointments and was a no show for 3 appointments at the [facility], after left rotator cuff repair on August 22, 2016. Patient reported that he did not feel that  the therapies were helping; therefore, he just quit going.  CSW encouraged patient to try and keep his scheduled physician appointments, ensuring that there are no barriers to being able to make these appointments.  Patient was last seen by Dr. Benjamine [name] on March 16th, but does not currently have a follow-up appointment scheduled.  CSW inquired as to whether or not patient was interested in having CSW assist him with this process, but patient denied, agreeing to schedule an appointment with Dr. Benjamine [name] this week.  Patient indicated that he will need Dr. Benjamine [name] to refill prescriptions soon. Patient is not currently involved with any community based support/programs, such as home health services.  Patient reports being able to afford to pay for his medications, currently prescribed more than 10.  Patient admits to taking his medications as prescribed.  Patient is able to perform all activities of daily living independently, not requiring care and supervision from others.  Patient currently resides with his significant other, [name] [name] and reports having a good support system through family members and friends. No additional social work needs identified at this time.  Patient did not appear to be interested in services provided through [employer]. CSW provided patient with a Copy, encouraging patient to contact CSW directly if he changes his mind.  Patient is aware that [facility] does not interfere or replace other community based case management services. [name] [name], BSW, MSW, LCSW  Licensed Education officer, [facility]  Mailing [place] [address] [place], [place] [address] Physical Address-[address], [place], [place] [address] Toll Free Main #  [phone] Fax # [phone] Cell # [phone]  Office # [phone] [name]

## 2017-01-02 NOTE — ED Triage Notes (Signed)
Pt reports to the ED for eval of left eye irritation. Onset 4 days. States that his eyes are matted with yellow drainage in the ams. He states that his eye is irritated and he feels like he when he blinks that there is something scratching his eye. Reports some blurred vision. Denies loss of vision. Eye erythematous at this time. Pupil is reactive.

## 2017-01-02 NOTE — Discharge Instructions (Signed)
Please follow-up with ophthalmologist listed below for further evaluation.

## 2017-01-02 NOTE — ED Provider Notes (Signed)
  Physical Exam  BP (!) 115/94 (BP Location: Left Arm)   Pulse (!) 105   Temp 98.3 F (36.8 C) (Oral)   Resp 16   SpO2 95%   Physical Exam  Constitutional: He appears well-developed and well-nourished. No distress.  HENT:  Head: Normocephalic and atraumatic.  Eyes: Conjunctivae and EOM are normal. No scleral icterus.  Neck: Normal range of motion.  Pulmonary/Chest: Effort normal. No respiratory distress.  Neurological: He is alert.  Skin: No rash noted. He is not diaphoretic.  Psychiatric: He has a normal mood and affect.  Nursing note and vitals reviewed.   ED Course  Procedures  MDM Care resumed from previous provider. Briefly, patient seen in the ED for left eye irritation, drainage. Fluorescein exam revealed dendritic lesion present. Ophthalmology was consulted who stated that he would see patient in clinic tomorrow morning. Ophthalmologist also recommended erythromycin ointment be used. I explained to patient the ophthalmologist recommendation. Patient appears stable for discharge at this time. Strict return precautions given.       Delia Heady, PA-C 01/02/17 1859    Fredia Sorrow, MD 01/05/17 [phone]

## 2017-01-09 DIAGNOSIS — M792 Neuralgia and neuritis, unspecified: Secondary | ICD-10-CM | POA: Diagnosis not present

## 2017-01-09 DIAGNOSIS — G894 Chronic pain syndrome: Secondary | ICD-10-CM | POA: Diagnosis not present

## 2017-01-18 DIAGNOSIS — M5137 Other intervertebral disc degeneration, lumbosacral region: Secondary | ICD-10-CM | POA: Diagnosis not present

## 2017-01-18 DIAGNOSIS — G894 Chronic pain syndrome: Secondary | ICD-10-CM | POA: Diagnosis not present

## 2017-01-18 DIAGNOSIS — Z79891 Long term (current) use of opiate analgesic: Secondary | ICD-10-CM | POA: Diagnosis not present

## 2017-01-18 DIAGNOSIS — M25559 Pain in unspecified hip: Secondary | ICD-10-CM | POA: Diagnosis not present

## 2017-01-18 DIAGNOSIS — M25519 Pain in unspecified shoulder: Secondary | ICD-10-CM | POA: Diagnosis not present

## 2017-01-18 DIAGNOSIS — Z79899 Other long term (current) drug therapy: Secondary | ICD-10-CM | POA: Diagnosis not present

## 2017-02-12 ENCOUNTER — Ambulatory Visit: Payer: Medicare Other | Admitting: Cardiology

## 2017-02-16 ENCOUNTER — Other Ambulatory Visit: Payer: Self-pay | Admitting: Internal Medicine

## 2017-02-16 DIAGNOSIS — I10 Essential (primary) hypertension: Secondary | ICD-10-CM

## 2017-02-19 DIAGNOSIS — G894 Chronic pain syndrome: Secondary | ICD-10-CM | POA: Diagnosis not present

## 2017-02-19 DIAGNOSIS — Z79891 Long term (current) use of opiate analgesic: Secondary | ICD-10-CM | POA: Diagnosis not present

## 2017-02-19 DIAGNOSIS — M5137 Other intervertebral disc degeneration, lumbosacral region: Secondary | ICD-10-CM | POA: Diagnosis not present

## 2017-02-19 DIAGNOSIS — M25559 Pain in unspecified hip: Secondary | ICD-10-CM | POA: Diagnosis not present

## 2017-02-19 DIAGNOSIS — Z79899 Other long term (current) drug therapy: Secondary | ICD-10-CM | POA: Diagnosis not present

## 2017-02-19 DIAGNOSIS — M25519 Pain in unspecified shoulder: Secondary | ICD-10-CM | POA: Diagnosis not present

## 2017-03-13 ENCOUNTER — Ambulatory Visit (INDEPENDENT_AMBULATORY_CARE_PROVIDER_SITE_OTHER): Payer: Medicare Other | Admitting: Cardiology

## 2017-03-13 ENCOUNTER — Encounter: Payer: Self-pay | Admitting: Cardiology

## 2017-03-13 ENCOUNTER — Encounter: Payer: Self-pay | Admitting: *Deleted

## 2017-03-13 VITALS — BP 124/82 | HR 88 | Ht 68.0 in | Wt 184.0 lb

## 2017-03-13 DIAGNOSIS — R0602 Shortness of breath: Secondary | ICD-10-CM | POA: Diagnosis not present

## 2017-03-13 DIAGNOSIS — E7849 Other hyperlipidemia: Secondary | ICD-10-CM

## 2017-03-13 DIAGNOSIS — Z0000 Encounter for general adult medical examination without abnormal findings: Secondary | ICD-10-CM | POA: Insufficient documentation

## 2017-03-13 DIAGNOSIS — I25110 Atherosclerotic heart disease of native coronary artery with unstable angina pectoris: Secondary | ICD-10-CM | POA: Diagnosis not present

## 2017-03-13 DIAGNOSIS — I10 Essential (primary) hypertension: Secondary | ICD-10-CM | POA: Diagnosis not present

## 2017-03-13 DIAGNOSIS — Z01812 Encounter for preprocedural laboratory examination: Secondary | ICD-10-CM

## 2017-03-13 DIAGNOSIS — R0609 Other forms of dyspnea: Secondary | ICD-10-CM | POA: Diagnosis not present

## 2017-03-13 DIAGNOSIS — R0600 Dyspnea, unspecified: Secondary | ICD-10-CM

## 2017-03-13 DIAGNOSIS — E784 Other hyperlipidemia: Secondary | ICD-10-CM | POA: Diagnosis not present

## 2017-03-13 DIAGNOSIS — R079 Chest pain, unspecified: Secondary | ICD-10-CM

## 2017-03-13 LAB — CBC WITH DIFFERENTIAL/PLATELET
Basophils Absolute: 0 10*3/uL (ref 0.0–0.2)
Basos: 0 %
EOS (ABSOLUTE): 0.2 10*3/uL (ref 0.0–0.4)
Eos: 3 %
Hematocrit: 41.6 % (ref 37.5–51.0)
Hemoglobin: 13.9 g/dL (ref 13.0–17.7)
Immature Grans (Abs): 0 10*3/uL (ref 0.0–0.1)
Immature Granulocytes: 0 %
Lymphocytes Absolute: 1.8 10*3/uL (ref 0.7–3.1)
Lymphs: 26 %
MCH: 28 pg (ref 26.6–33.0)
MCHC: 33.4 g/dL (ref 31.5–35.7)
MCV: 84 fL (ref 79–97)
Monocytes Absolute: 0.5 10*3/uL (ref 0.1–0.9)
Monocytes: 8 %
Neutrophils Absolute: 4.4 10*3/uL (ref 1.4–7.0)
Neutrophils: 63 %
Platelets: 204 10*3/uL (ref 150–379)
RBC: 4.96 x10E6/uL (ref 4.14–5.80)
RDW: 13.7 % (ref 12.3–15.4)
WBC: 7 10*3/uL (ref 3.4–10.8)

## 2017-03-13 LAB — PROTIME-INR
INR: 1 (ref 0.8–1.2)
Prothrombin Time: 10.1 s (ref 9.1–12.0)

## 2017-03-13 LAB — BASIC METABOLIC PANEL
BUN/Creatinine Ratio: 13 (ref 9–20)
BUN: 12 mg/dL (ref 6–24)
CO2: 22 mmol/L (ref 20–29)
Calcium: 9.7 mg/dL (ref 8.7–10.2)
Chloride: 105 mmol/L (ref 96–106)
Creatinine, Ser: 0.96 mg/dL (ref 0.76–1.27)
GFR calc Af Amer: 102 mL/min/{1.73_m2} (ref >59)
GFR calc non Af Amer: 88 mL/min/{1.73_m2} (ref >59)
Glucose: 104 mg/dL — ABNORMAL HIGH (ref 65–99)
Potassium: 4.5 mmol/L (ref 3.5–5.2)
Sodium: 144 mmol/L (ref 134–144)

## 2017-03-13 NOTE — Progress Notes (Signed)
Cardiology Office Note    Date:  03/13/2017   ID:  [name] [name], DOB June 01, 1961, MRN [id number]  PCP:  Collier Salina, MD  Cardiologist: Dr. Meda Coffee  Chief complain: Dyspnea on exertion  History of Present Illness:  [name] [name] is a 56 y.o. male  with prior medical history with lupus and rheumatoid arthritis with involvement of his metacarpal joints and chronic pain, ongoing tobacco abuse and premature coronary artery disease in his father was also a smoker, who previously had an echocardiogram done in 2013 which finding of aortic valve modest measuring 77 mm suspicious of Libman-Sacks endocarditis. Patient  denies any symptoms of stroke. He has been started on infliximab for his rheumatologic problems and has developed significant chest pain which shortness of breath and diaphoresis during infliximab infusions but otherwise not while he's walking or exerting himself otherwise.RDS enzymes were negative, EKG showed sinus bradycardia with nonspecific ST changes, blood pressure was elevated and he has moderate concentric LVH so amlodipine 5 mg was added. With regards to his aortic valve mass that has only minimally enlarged in the last 4 years, with associated mild-to-moderate aortic regurgitation, and this point I would not proceed with any further management unless patient has symptoms of peripheral embolism. CTA coronary calcium score was 25 with mild to moderate nonobstructive plaque in the RCA aggressive risk factor modification recommended. Mildly dilated pulmonary artery suggestive of pulmonary hypertension. No further workup needed.  Patient comes in today feeling better. He says he thinks his left shoulder pain is causing all his issues. He is looking for a new rheumatologist. He denies any exertional chest pain. He says he has a numbness in his throat that he notices when he sits down. He is trying to quit smoking. He is down to 3 cigarettes a day from 1-1/2 packs daily. He hasn't had his  cholesterol checked in a long time but his LFTs have been elevated.  03/13/2017 - this is a 1 year follow-up, since last year to patient quit smoking about 4 months ago, and he is trying to walk twice a day. Over the last 2 months he has noticed worsening dyspnea on exertion that makes him stop several times. He states that this is significantly worse than here ago. He has been compliant with his medications and has no side effects. He denies any claudications no lower extremity edema orthopnea or proximal nocturnal dyspnea.  Past Medical History:  Diagnosis Date  . Anemia   . GERD (gastroesophageal reflux disease)   . Heart murmur   . History of anemia    no current med.  . Hypertension    states is borderline on medication; has been on med. x 5-6 yr.  . Impingement syndrome of shoulder region 10/2014   left  . Lupus   . Pneumonia   . RA (rheumatoid arthritis) ([place])     Past Surgical History:  Procedure Laterality Date  . ACHILLES TENDON SURGERY Bilateral   . OSTEOTOMY AND ULNAR SHORTENING Right 07/22/2002  . SHOULDER ARTHROSCOPY WITH BICEPSTENOTOMY Right 03/11/2015   Procedure: SHOULDER ARTHROSCOPY WITH BICEPSTENOTOMY;  Surgeon: Ninetta Lights, MD;  Location: [facility];  Service: Orthopedics;  Laterality: Right;  . SHOULDER ARTHROSCOPY WITH DISTAL CLAVICLE RESECTION Left 11/19/2014   Procedure: SHOULDER ARTHROSCOPY WITH DISTAL CLAVICLE RESECTION;  Surgeon: Kathryne Hitch, MD;  Location: [facility];  Service: Orthopedics;  Laterality: Left;  . SHOULDER ARTHROSCOPY WITH ROTATOR CUFF REPAIR AND SUBACROMIAL DECOMPRESSION Left 11/19/2014   Procedure:  LEFT SHOULDER ARTHROSCOPY, DEBRIDEMENT DISTAL CLAVICLE EXCISION, ACROMIOPLASTY WITH ROTATOR CUFF REPAIR ;  Surgeon: Kathryne Hitch, MD;  Location: [facility];  Service: Orthopedics;  Laterality: Left;  [name] VIDEO ASSISTED THORACOSCOPY (VATS)/DECORTICATION Right 11/21/2011   drainage of empyema    Current  Medications: Outpatient Medications Prior to Visit  Medication Sig Dispense Refill  . amLODipine (NORVASC) 10 MG tablet TAKE 1 TABLET (10 MG TOTAL) BY MOUTH DAILY. 90 tablet 1  . aspirin EC 81 MG EC tablet Take 1 tablet (81 mg total) by mouth daily. 30 tablet 11  . baclofen (LIORESAL) 10 MG tablet Take 10 mg by mouth 2 (two) times daily as needed for muscle spasms.   2  . EMBEDA 50-2 MG CPCR Take 1 tablet by mouth daily.  0  . nicotine (NICODERM CQ - DOSED IN MG/24 HOURS) 14 mg/24hr patch Place 1 patch (14 mg total) onto the skin daily. 30 patch 0  . oxyCODONE-acetaminophen (PERCOCET) 10-325 MG tablet Take 1 tablet by mouth daily as needed for pain.     [name] erythromycin ophthalmic ointment Place a 1/2 inch ribbon of ointment into the lower eyelid. 3.5 g 0  . ferrous sulfate 325 (65 FE) MG tablet Take 325 mg by mouth daily with breakfast.    . meloxicam (MOBIC) 15 MG tablet Take 1 tablet (15 mg total) by mouth daily. 30 tablet 2  . nitroGLYCERIN (NITROLINGUAL) 0.4 MG/SPRAY spray Place 1 spray under the tongue every 5 (five) minutes x 3 doses as needed for chest pain. 12 g 12  . ondansetron (ZOFRAN) 4 MG tablet Take 1 tablet (4 mg total) by mouth every 8 (eight) hours as needed for nausea or vomiting. 40 tablet 0  . pantoprazole (PROTONIX) 40 MG tablet TAKE 1 TABLET BY MOUTH TWICE DAILY 60 tablet 1  . pravastatin (PRAVACHOL) 40 MG tablet Take 1 tablet (40 mg total) by mouth every evening. 90 tablet 1   No facility-administered medications prior to visit.      Allergies:   Ramipril [ramipril] and Tramadol   Social History   Social History  . Marital status: Single    Spouse name: N/A  . Number of children: N/A  . Years of education: N/A   Social History Main Topics  . Smoking status: Current Every Day Smoker    Packs/day: 0.10    Years: 20.00    Types: Cigarettes  . Smokeless tobacco: Never Used     Comment: 1-2  per cig./day  . Alcohol use No  . Drug use: No  . Sexual activity: Not  Asked   Other Topics Concern  . None   Social History Narrative   Patient lives by himself, is on disability since 1992, used to work with city [employer] [place]. 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 Smokes 1/2 PPD and has been smoking since 25 years. Quit drinking in 1990.     Family History:  The patient's family history includes Heart disease (age of onset: 25) in his father.   ROS:   Please see the history of present illness.    Review of Systems  Respiratory: Positive for sleep disturbances due to breathing.   Hematologic/Lymphatic: Bruises/bleeds easily.  Musculoskeletal: Positive for back pain, joint swelling and myalgias.   All other systems reviewed and are negative.   PHYSICAL EXAM:   VS:  BP 124/82   Pulse 88   Ht 5\' 8"  (1.727 m)   Wt 184 lb (83.5 kg)   BMI 27.98 kg/m  Physical Exam  GEN: Well nourished, well developed, in no acute distress  HEENT: normal  Neck: no JVD, carotid bruits, or masses Cardiac:RRR; 2/6 systolic murmurs, rubs, or gallops  Respiratory:  clear to auscultation bilaterally, normal work of breathing, no wheezing GI: soft, nontender, nondistended, + BS Ext: without cyanosis, clubbing, or edema, Good distal pulses bilaterally MS: no deformity or atrophy  Skin: warm and dry, no rash Neuro:  Alert and Oriented x 3, Strength and sensation are intact Psych: euthymic mood, full affect  Wt Readings from Last 3 Encounters:  03/13/17 184 lb (83.5 kg)  09/08/16 182 lb 12.8 oz (82.9 kg)  02/25/16 184 lb 11.2 oz (83.8 kg)      Studies/Labs Reviewed:   EKG:  EKG is not ordered today.   Recent Labs: No results found for requested labs within last 8760 hours.   Lipid Panel    Component Value Date/Time   CHOL 151 04/08/2012 0942   TRIG 269 (H) 04/08/2012 0942   HDL 24 (L) 04/08/2012 0942   CHOLHDL 6.3 04/08/2012 0942   VLDL 54 (H) 04/08/2012 0942   LDLCALC 73 04/08/2012 0942    Additional studies/ records that were reviewed today include:   Echo:  12/09/2015 LV EF: 60% -   65% Study Conclusions - Left ventricle: The cavity size was normal. There was moderate   concentric hypertrophy. Systolic function was normal. The   estimated ejection fraction was in the range of 60% to 65%. Wall   motion was normal; there were no regional wall motion   abnormalities. Doppler parameters are consistent with abnormal   left ventricular relaxation (grade 1 diastolic dysfunction).   Doppler parameters are consistent with indeterminate ventricular   filling pressure. - Aortic valve: There was mild to moderate regurgitation. The   hyperechoic lesion on the R coronary cusp of the aortic valve is   1.0 cm x 0.87 cm. Slightly larger than in 10/2011 (7.5 cm x 7.5   cm) Regurgitation pressure half-time: 244 ms. Aortic valve   regurgitation appears mild visually, but is moderate by pressure   half-time. - Mitral valve: Transvalvular velocity was within the normal range.   There was no evidence for stenosis. There was no regurgitation. - Right ventricle: The cavity size was normal. Wall thickness was   normal. Systolic function was normal. - Atrial septum: No defect or patent foramen ovale was identified   by color flow Doppler. - Tricuspid valve: There was trivial regurgitation. - Pulmonary arteries: Systolic pressure was within the normal   range. PA peak pressure: 28 mm Hg (S).   Filed: 12/09/2015  6:24 PM Note Time: 12/09/2015 6:21 PM Status: Signed     Editor: Leanor Kail, PA (Physician Assistant)      Expand All Collapse All   Coronary CT showed:  IMPRESSION: 1. Coronary calcium score of 25. This was 30 percentile for age and sex matched control. 2.  Normal coronary origin with right and left co-dominance. 3. Mild to moderate non-obstructive plaque in the RCA. An aggressive risk factor modification is recommended. 4.  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 5. Mildly dilated pulmonary artery suggestive of  pulmonary Hypertension.    No further work up needed. Continue bystolic 5mg  and amlodipine. Ok to discharge. Follow up in clinic few weeks (office will call with appointment) with CMP at that time.    Bhagat,Bhavinkumar, PAC              ASSESSMENT:  1. DOE (dyspnea on exertion)   2. Essential hypertension   3. Other hyperlipidemia   4. Coronary artery disease involving native coronary artery of native heart with unstable angina pectoris ([place])    EKG performed today 03/13/2017 shows normal sinus rhythm with PVCs and negative T waves in the inferior and lateral leads that are new when compared to the prior.  PLAN:  In order of problems listed above:  1. The patient has new progressively worsening dyspnea on exertion, his EKG performed today shows new negative T waves in inferior anterolateral leads suggestive of ischemia, he has known moderate nonobstructive disease on coronary CT a year ago. We'll schedule for left cardiac catheterization.  2. Hypertension is well controlled on current regimen.  3. On pravastatin 40 mg daily will continue.   Medication Adjustments/Labs and Tests Ordered: Current medicines are reviewed at length with the patient today.  Concerns regarding medicines are outlined above.  Medication changes, Labs and Tests ordered today are listed in the Patient Instructions below. There are no Patient Instructions on file for this visit.   Signed, Ena Dawley, MD  03/13/2017 9:49 AM    [address], [place], [place]  [address] Phone: [phone]; Fax: [phone]

## 2017-03-13 NOTE — Patient Instructions (Addendum)
Medication Instructions:   Your physician recommends that you continue on your current medications as directed. Please refer to the Current Medication list given to you today.    Labwork:  TODAY--PRE-CATH LABS---PT/INR, BMET, AND CBC W DIFF      Testing/Procedures:    [facility] [address], [place] [address] Dept: [phone] Loc: [facility]  03/13/2017  You are scheduled for a Cardiac Catheterization on Thursday, September 20 with Dr. Harrell Gave End.  1. Please arrive at the [facility] (Main Entrance A) at [facility]: [place], [place] [address] at 10:00 AM (two hours before your procedure to ensure your preparation). Free valet parking service is available.   Special note: Every effort is made to have your procedure done on time. Please understand that emergencies sometimes delay scheduled procedures.  2. Diet: Do not eat or drink anything after midnight prior to your procedure except sips of water to take medications.  3. Labs: You will need to have blood drawn on Tuesday, September 18 at [facility] at [facility]. [address]  Open: 7:30am - 5pm    Phone: [phone]. You do not need to be fasting.  4. Medication instructions in preparation for your procedure:   On the morning of your procedure, take your Aspirin and any morning medicines NOT listed above.  You may use sips of water.  5. Plan for one night stay--bring personal belongings. 6. Bring a current list of your medications and current insurance cards. 7. You MUST have a responsible person to drive you home. 8. Someone MUST be with you the first 24 hours after you arrive home or your discharge will be delayed. 9. Please wear clothes that are easy to get on and off and wear slip-on shoes.  Thank you for allowing [place] to care for you!  -- [facility]      Follow-Up:  FOLLOW-UP WILL BE ARRANGED POST CATH FINDINGS       If you need a refill on your cardiac medications before your next appointment, please call your pharmacy.

## 2017-03-15 ENCOUNTER — Encounter (HOSPITAL_COMMUNITY)
Admission: RE | Disposition: A | Discharge status: Home / Self Care | Payer: Self-pay | Source: Ambulatory Visit | Attending: Internal Medicine

## 2017-03-15 ENCOUNTER — Ambulatory Visit (HOSPITAL_COMMUNITY)
Admission: RE | Admit: 2017-03-15 | Discharge: 2017-03-15 | Disposition: A | Discharge status: Home / Self Care | Payer: Medicare Other | Source: Ambulatory Visit | Attending: Internal Medicine | Admitting: Internal Medicine

## 2017-03-15 DIAGNOSIS — Z7982 Long term (current) use of aspirin: Secondary | ICD-10-CM | POA: Insufficient documentation

## 2017-03-15 DIAGNOSIS — F17210 Nicotine dependence, cigarettes, uncomplicated: Secondary | ICD-10-CM | POA: Insufficient documentation

## 2017-03-15 DIAGNOSIS — R0609 Other forms of dyspnea: Secondary | ICD-10-CM

## 2017-03-15 DIAGNOSIS — Z79899 Other long term (current) drug therapy: Secondary | ICD-10-CM | POA: Diagnosis not present

## 2017-03-15 DIAGNOSIS — I10 Essential (primary) hypertension: Secondary | ICD-10-CM | POA: Insufficient documentation

## 2017-03-15 DIAGNOSIS — I25110 Atherosclerotic heart disease of native coronary artery with unstable angina pectoris: Secondary | ICD-10-CM | POA: Diagnosis not present

## 2017-03-15 DIAGNOSIS — M7542 Impingement syndrome of left shoulder: Secondary | ICD-10-CM | POA: Diagnosis not present

## 2017-03-15 DIAGNOSIS — Z888 Allergy status to other drugs, medicaments and biological substances status: Secondary | ICD-10-CM | POA: Diagnosis not present

## 2017-03-15 DIAGNOSIS — M069 Rheumatoid arthritis, unspecified: Secondary | ICD-10-CM | POA: Insufficient documentation

## 2017-03-15 DIAGNOSIS — K219 Gastro-esophageal reflux disease without esophagitis: Secondary | ICD-10-CM | POA: Diagnosis not present

## 2017-03-15 DIAGNOSIS — I2510 Atherosclerotic heart disease of native coronary artery without angina pectoris: Secondary | ICD-10-CM

## 2017-03-15 DIAGNOSIS — Z8249 Family history of ischemic heart disease and other diseases of the circulatory system: Secondary | ICD-10-CM | POA: Insufficient documentation

## 2017-03-15 DIAGNOSIS — M329 Systemic lupus erythematosus, unspecified: Secondary | ICD-10-CM | POA: Insufficient documentation

## 2017-03-15 DIAGNOSIS — E784 Other hyperlipidemia: Secondary | ICD-10-CM | POA: Diagnosis not present

## 2017-03-15 DIAGNOSIS — R0600 Dyspnea, unspecified: Secondary | ICD-10-CM | POA: Diagnosis present

## 2017-03-15 HISTORY — PX: LEFT HEART CATH AND CORONARY ANGIOGRAPHY: CATH118249

## 2017-03-15 SURGERY — LEFT HEART CATH AND CORONARY ANGIOGRAPHY
Anesthesia: LOCAL

## 2017-03-15 MED ORDER — FENTANYL CITRATE (PF) 100 MCG/2ML IJ SOLN
INTRAMUSCULAR | Status: DC | PRN
  Administered 2017-03-15: 25 ug via INTRAVENOUS
  Administered 2017-03-15: 50 ug via INTRAVENOUS

## 2017-03-15 MED ORDER — FENTANYL CITRATE (PF) 100 MCG/2ML IJ SOLN
INTRAMUSCULAR | Status: AC
  Filled 2017-03-15: qty 2

## 2017-03-15 MED ORDER — SODIUM CHLORIDE 0.9% FLUSH: 3.0000 mL | Freq: Two times a day (BID) | INTRAVENOUS | Status: DC

## 2017-03-15 MED ORDER — SODIUM CHLORIDE 0.9 % IV SOLN: 250.0000 mL | INTRAVENOUS | Status: DC | PRN

## 2017-03-15 MED ORDER — MIDAZOLAM HCL 2 MG/2ML IJ SOLN
INTRAMUSCULAR | Status: AC
  Filled 2017-03-15: qty 2

## 2017-03-15 MED ORDER — IOPAMIDOL (ISOVUE-370) INJECTION 76%
INTRAVENOUS | Status: DC | PRN
  Administered 2017-03-15: 45 mL via INTRA_ARTERIAL

## 2017-03-15 MED ORDER — LIDOCAINE HCL 2 % IJ SOLN
INTRAMUSCULAR | Status: AC
  Filled 2017-03-15: qty 10

## 2017-03-15 MED ORDER — SODIUM CHLORIDE 0.9% FLUSH: 3.0000 mL | INTRAVENOUS | Status: DC | PRN

## 2017-03-15 MED ORDER — HEPARIN (PORCINE) IN NACL 2-0.9 UNIT/ML-% IJ SOLN
INTRAMUSCULAR | Status: AC
  Filled 2017-03-15: qty 1000

## 2017-03-15 MED ORDER — VERAPAMIL HCL 2.5 MG/ML IV SOLN
INTRAVENOUS | Status: AC
Start: 2017-03-15 — End: ?
  Filled 2017-03-15: qty 2

## 2017-03-15 MED ORDER — HEPARIN SODIUM (PORCINE) 1000 UNIT/ML IJ SOLN
INTRAMUSCULAR | Status: AC
  Filled 2017-03-15: qty 1

## 2017-03-15 MED ORDER — MIDAZOLAM HCL 2 MG/2ML IJ SOLN
INTRAMUSCULAR | Status: DC | PRN
  Administered 2017-03-15 (×2): 1 mg via INTRAVENOUS

## 2017-03-15 MED ORDER — SODIUM CHLORIDE 0.9 % WEIGHT BASED INFUSION
3.0000 mL/kg/h | INTRAVENOUS | Status: AC
  Administered 2017-03-15: 3 mL/kg/h via INTRAVENOUS

## 2017-03-15 MED ORDER — HEPARIN (PORCINE) IN NACL 2-0.9 UNIT/ML-% IJ SOLN
INTRAMUSCULAR | Status: AC | PRN
  Administered 2017-03-15: 1000 mL

## 2017-03-15 MED ORDER — ASPIRIN 81 MG PO CHEW
81.0000 mg | CHEWABLE_TABLET | ORAL | Status: AC
  Administered 2017-03-15: 81 mg via ORAL

## 2017-03-15 MED ORDER — IOPAMIDOL (ISOVUE-370) INJECTION 76%
INTRAVENOUS | Status: AC
  Filled 2017-03-15: qty 100

## 2017-03-15 MED ORDER — SODIUM CHLORIDE 0.9 % WEIGHT BASED INFUSION: 1.0000 mL/kg/h | INTRAVENOUS | Status: DC

## 2017-03-15 MED ORDER — LIDOCAINE HCL (PF) 1 % IJ SOLN
INTRAMUSCULAR | Status: DC | PRN
  Administered 2017-03-15: 2 mL

## 2017-03-15 MED ORDER — ASPIRIN 81 MG PO CHEW
CHEWABLE_TABLET | ORAL | Status: AC
  Administered 2017-03-15: 81 mg via ORAL
  Filled 2017-03-15: qty 1

## 2017-03-15 MED ORDER — HEPARIN SODIUM (PORCINE) 1000 UNIT/ML IJ SOLN
INTRAMUSCULAR | Status: DC | PRN
  Administered 2017-03-15: 4500 [IU] via INTRAVENOUS

## 2017-03-15 MED ORDER — SODIUM CHLORIDE 0.9 % IV SOLN: INTRAVENOUS | Status: DC

## 2017-03-15 SURGICAL SUPPLY — 9 items

## 2017-03-15 NOTE — Discharge Instructions (Signed)

## 2017-03-15 NOTE — H&P (View-Only) (Signed)
Cardiology Office Note    Date:  03/13/2017   ID:  [name], DOB [birth year], MRN [id number]  PCP:  Collier Salina, MD  Cardiologist: Dr. Meda Coffee  Chief complain: Dyspnea on exertion  History of Present Illness:  [name] [name] is a 56 y.o. male  with prior medical history with lupus and rheumatoid arthritis with involvement of his metacarpal joints and chronic pain, ongoing tobacco abuse and premature coronary artery disease in his father was also a smoker, who previously had an echocardiogram done in 2013 which finding of aortic valve modest measuring 77 mm suspicious of Libman-Sacks endocarditis. Patient  denies any symptoms of stroke. He has been started on infliximab for his rheumatologic problems and has developed significant chest pain which shortness of breath and diaphoresis during infliximab infusions but otherwise not while he's walking or exerting himself otherwise.RDS enzymes were negative, EKG showed sinus bradycardia with nonspecific ST changes, blood pressure was elevated and he has moderate concentric LVH so amlodipine 5 mg was added. With regards to his aortic valve mass that has only minimally enlarged in the last 4 years, with associated mild-to-moderate aortic regurgitation, and this point I would not proceed with any further management unless patient has symptoms of peripheral embolism. CTA coronary calcium score was 25 with mild to moderate nonobstructive plaque in the RCA aggressive risk factor modification recommended. Mildly dilated pulmonary artery suggestive of pulmonary hypertension. No further workup needed.  Patient comes in today feeling better. He says he thinks his left shoulder pain is causing all his issues. He is looking for a new rheumatologist. He denies any exertional chest pain. He says he has a numbness in his throat that he notices when he sits down. He is trying to quit smoking. He is down to 3 cigarettes a day from 1-1/2 packs daily. He hasn't had his  cholesterol checked in a long time but his LFTs have been elevated.  03/13/2017 - this is a 1 year follow-up, since last year to patient quit smoking about 4 months ago, and he is trying to walk twice a day. Over the last 2 months he has noticed worsening dyspnea on exertion that makes him stop several times. He states that this is significantly worse than here ago. He has been compliant with his medications and has no side effects. He denies any claudications no lower extremity edema orthopnea or proximal nocturnal dyspnea.  Past Medical History:  Diagnosis Date  . Anemia   . GERD (gastroesophageal reflux disease)   . Heart murmur   . History of anemia    no current med.  . Hypertension    states is borderline on medication; has been on med. x 5-6 yr.  . Impingement syndrome of shoulder region 10/2014   left  . Lupus   . Pneumonia   . RA (rheumatoid arthritis) ([place])     Past Surgical History:  Procedure Laterality Date  . ACHILLES TENDON SURGERY Bilateral   . OSTEOTOMY AND ULNAR SHORTENING Right 07/22/2002  . SHOULDER ARTHROSCOPY WITH BICEPSTENOTOMY Right 03/11/2015   Procedure: SHOULDER ARTHROSCOPY WITH BICEPSTENOTOMY;  Surgeon: Ninetta Lights, MD;  Location: [facility];  Service: Orthopedics;  Laterality: Right;  . SHOULDER ARTHROSCOPY WITH DISTAL CLAVICLE RESECTION Left 11/19/2014   Procedure: SHOULDER ARTHROSCOPY WITH DISTAL CLAVICLE RESECTION;  Surgeon: Kathryne Hitch, MD;  Location: [facility];  Service: Orthopedics;  Laterality: Left;  . SHOULDER ARTHROSCOPY WITH ROTATOR CUFF REPAIR AND SUBACROMIAL DECOMPRESSION Left 11/19/2014   Procedure:  LEFT SHOULDER ARTHROSCOPY, DEBRIDEMENT DISTAL CLAVICLE EXCISION, ACROMIOPLASTY WITH ROTATOR CUFF REPAIR ;  Surgeon: Kathryne Hitch, MD;  Location: [facility];  Service: Orthopedics;  Laterality: Left;  [name] VIDEO ASSISTED THORACOSCOPY (VATS)/DECORTICATION Right 11/21/2011   drainage of empyema    Current  Medications: Outpatient Medications Prior to Visit  Medication Sig Dispense Refill  . amLODipine (NORVASC) 10 MG tablet TAKE 1 TABLET (10 MG TOTAL) BY MOUTH DAILY. 90 tablet 1  . aspirin EC 81 MG EC tablet Take 1 tablet (81 mg total) by mouth daily. 30 tablet 11  . baclofen (LIORESAL) 10 MG tablet Take 10 mg by mouth 2 (two) times daily as needed for muscle spasms.   2  . EMBEDA 50-2 MG CPCR Take 1 tablet by mouth daily.  0  . nicotine (NICODERM CQ - DOSED IN MG/24 HOURS) 14 mg/24hr patch Place 1 patch (14 mg total) onto the skin daily. 30 patch 0  . oxyCODONE-acetaminophen (PERCOCET) 10-325 MG tablet Take 1 tablet by mouth daily as needed for pain.     [name] erythromycin ophthalmic ointment Place a 1/2 inch ribbon of ointment into the lower eyelid. 3.5 g 0  . ferrous sulfate 325 (65 FE) MG tablet Take 325 mg by mouth daily with breakfast.    . meloxicam (MOBIC) 15 MG tablet Take 1 tablet (15 mg total) by mouth daily. 30 tablet 2  . nitroGLYCERIN (NITROLINGUAL) 0.4 MG/SPRAY spray Place 1 spray under the tongue every 5 (five) minutes x 3 doses as needed for chest pain. 12 g 12  . ondansetron (ZOFRAN) 4 MG tablet Take 1 tablet (4 mg total) by mouth every 8 (eight) hours as needed for nausea or vomiting. 40 tablet 0  . pantoprazole (PROTONIX) 40 MG tablet TAKE 1 TABLET BY MOUTH TWICE DAILY 60 tablet 1  . pravastatin (PRAVACHOL) 40 MG tablet Take 1 tablet (40 mg total) by mouth every evening. 90 tablet 1   No facility-administered medications prior to visit.      Allergies:   Ramipril [ramipril] and Tramadol   Social History   Social History  . Marital status: Single    Spouse name: N/A  . Number of children: N/A  . Years of education: N/A   Social History Main Topics  . Smoking status: Current Every Day Smoker    Packs/day: 0.10    Years: 20.00    Types: Cigarettes  . Smokeless tobacco: Never Used     Comment: 1-2  per cig./day  . Alcohol use No  . Drug use: No  . Sexual activity: Not  Asked   Other Topics Concern  . None   Social History Narrative   Patient lives by himself, is on disability since 1992, used to work with city [employer] [place]. 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 Smokes 1/2 PPD and has been smoking since 25 years. Quit drinking in 1990.     Family History:  The patient's family history includes Heart disease (age of onset: 1) in his father.   ROS:   Please see the history of present illness.    Review of Systems  Respiratory: Positive for sleep disturbances due to breathing.   Hematologic/Lymphatic: Bruises/bleeds easily.  Musculoskeletal: Positive for back pain, joint swelling and myalgias.   All other systems reviewed and are negative.   PHYSICAL EXAM:   VS:  BP 124/82   Pulse 88   Ht 5\' 8"  (1.727 m)   Wt 184 lb (83.5 kg)   BMI 27.98 kg/m  Physical Exam  GEN: Well nourished, well developed, in no acute distress  HEENT: normal  Neck: no JVD, carotid bruits, or masses Cardiac:RRR; 2/6 systolic murmurs, rubs, or gallops  Respiratory:  clear to auscultation bilaterally, normal work of breathing, no wheezing GI: soft, nontender, nondistended, + BS Ext: without cyanosis, clubbing, or edema, Good distal pulses bilaterally MS: no deformity or atrophy  Skin: warm and dry, no rash Neuro:  Alert and Oriented x 3, Strength and sensation are intact Psych: euthymic mood, full affect  Wt Readings from Last 3 Encounters:  03/13/17 184 lb (83.5 kg)  09/08/16 182 lb 12.8 oz (82.9 kg)  02/25/16 184 lb 11.2 oz (83.8 kg)      Studies/Labs Reviewed:   EKG:  EKG is not ordered today.   Recent Labs: No results found for requested labs within last 8760 hours.   Lipid Panel    Component Value Date/Time   CHOL 151 04/08/2012 0942   TRIG 269 (H) 04/08/2012 0942   HDL 24 (L) 04/08/2012 0942   CHOLHDL 6.3 04/08/2012 0942   VLDL 54 (H) 04/08/2012 0942   LDLCALC 73 04/08/2012 0942    Additional studies/ records that were reviewed today include:   Echo:  12/09/2015 LV EF: 60% -   65% Study Conclusions - Left ventricle: The cavity size was normal. There was moderate   concentric hypertrophy. Systolic function was normal. The   estimated ejection fraction was in the range of 60% to 65%. Wall   motion was normal; there were no regional wall motion   abnormalities. Doppler parameters are consistent with abnormal   left ventricular relaxation (grade 1 diastolic dysfunction).   Doppler parameters are consistent with indeterminate ventricular   filling pressure. - Aortic valve: There was mild to moderate regurgitation. The   hyperechoic lesion on the R coronary cusp of the aortic valve is   1.0 cm x 0.87 cm. Slightly larger than in 10/2011 (7.5 cm x 7.5   cm) Regurgitation pressure half-time: 244 ms. Aortic valve   regurgitation appears mild visually, but is moderate by pressure   half-time. - Mitral valve: Transvalvular velocity was within the normal range.   There was no evidence for stenosis. There was no regurgitation. - Right ventricle: The cavity size was normal. Wall thickness was   normal. Systolic function was normal. - Atrial septum: No defect or patent foramen ovale was identified   by color flow Doppler. - Tricuspid valve: There was trivial regurgitation. - Pulmonary arteries: Systolic pressure was within the normal   range. PA peak pressure: 28 mm Hg (S).   Filed: 12/09/2015  6:24 PM Note Time: 12/09/2015 6:21 PM Status: Signed     Editor: Leanor Kail, PA (Physician Assistant)      Expand All Collapse All   Coronary CT showed:  IMPRESSION: 1. Coronary calcium score of 25. This was 96 percentile for age and sex matched control. 2.  Normal coronary origin with right and left co-dominance. 3. Mild to moderate non-obstructive plaque in the RCA. An aggressive risk factor modification is recommended. 4.  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 5. Mildly dilated pulmonary artery suggestive of  pulmonary Hypertension.    No further work up needed. Continue bystolic 5mg  and amlodipine. Ok to discharge. Follow up in clinic few weeks (office will call with appointment) with CMP at that time.    Bhagat,Bhavinkumar, PAC              ASSESSMENT:  1. DOE (dyspnea on exertion)   2. Essential hypertension   3. Other hyperlipidemia   4. Coronary artery disease involving native coronary artery of native heart with unstable angina pectoris ([facility])    EKG performed today 03/13/2017 shows normal sinus rhythm with PVCs and negative T waves in the inferior and lateral leads that are new when compared to the prior.  PLAN:  In order of problems listed above:  1. The patient has new progressively worsening dyspnea on exertion, his EKG performed today shows new negative T waves in inferior anterolateral leads suggestive of ischemia, he has known moderate nonobstructive disease on coronary CT a year ago. We'll schedule for left cardiac catheterization.  2. Hypertension is well controlled on current regimen.  3. On pravastatin 40 mg daily will continue.   Medication Adjustments/Labs and Tests Ordered: Current medicines are reviewed at length with the patient today.  Concerns regarding medicines are outlined above.  Medication changes, Labs and Tests ordered today are listed in the Patient Instructions below. There are no Patient Instructions on file for this visit.   Signed, Ena Dawley, MD  03/13/2017 9:49 AM    [address], [place], [place]  [address] Phone: [phone]; Fax: [phone]

## 2017-03-15 NOTE — Interval H&P Note (Signed)
History and Physical Interval Note:  03/15/2017 11:52 AM  [name] [name]  has presented today for cardiac catheterization, with the diagnosis of abnormal ekg, shortness of breath, CAD, cp  The various methods of treatment have been discussed with the patient and family. After consideration of risks, benefits and other options for treatment, the patient has consented to  Procedure(s): LEFT HEART CATH AND CORONARY ANGIOGRAPHY (N/A) as a surgical intervention .  The patient's history has been reviewed, patient examined, no change in status, stable for surgery.  I have reviewed the patient's chart and labs.  Questions were answered to the patient's satisfaction.    Cath Lab Visit (complete for each Cath Lab visit)  Clinical Evaluation Leading to the Procedure:   ACS: No.  Non-ACS:    Anginal Classification: CCS III  Anti-ischemic medical therapy: Minimal Therapy (1 class of medications)  Non-Invasive Test Results: No non-invasive testing performed CTA chest 1 year ago with mild to moderate disease.  Prior CABG: No previous CABG  [name] [name]

## 2017-03-16 ENCOUNTER — Encounter (HOSPITAL_COMMUNITY): Payer: Self-pay | Admitting: Internal Medicine

## 2017-03-20 DIAGNOSIS — M25559 Pain in unspecified hip: Secondary | ICD-10-CM | POA: Diagnosis not present

## 2017-03-20 DIAGNOSIS — M5137 Other intervertebral disc degeneration, lumbosacral region: Secondary | ICD-10-CM | POA: Diagnosis not present

## 2017-03-20 DIAGNOSIS — Z79891 Long term (current) use of opiate analgesic: Secondary | ICD-10-CM | POA: Diagnosis not present

## 2017-03-20 DIAGNOSIS — M069 Rheumatoid arthritis, unspecified: Secondary | ICD-10-CM | POA: Diagnosis not present

## 2017-03-20 DIAGNOSIS — Z79899 Other long term (current) drug therapy: Secondary | ICD-10-CM | POA: Diagnosis not present

## 2017-03-20 DIAGNOSIS — G894 Chronic pain syndrome: Secondary | ICD-10-CM | POA: Diagnosis not present

## 2017-03-31 ENCOUNTER — Encounter (HOSPITAL_COMMUNITY): Payer: Self-pay | Admitting: *Deleted

## 2017-03-31 ENCOUNTER — Emergency Department (HOSPITAL_COMMUNITY)
Admission: EM | Admit: 2017-03-31 | Discharge: 2017-03-31 | Disposition: A | Discharge status: Home / Self Care | Payer: Medicare Other | Attending: Emergency Medicine | Admitting: Emergency Medicine

## 2017-03-31 DIAGNOSIS — Z7982 Long term (current) use of aspirin: Secondary | ICD-10-CM | POA: Insufficient documentation

## 2017-03-31 DIAGNOSIS — I10 Essential (primary) hypertension: Secondary | ICD-10-CM | POA: Diagnosis not present

## 2017-03-31 DIAGNOSIS — Z79899 Other long term (current) drug therapy: Secondary | ICD-10-CM | POA: Insufficient documentation

## 2017-03-31 DIAGNOSIS — I2510 Atherosclerotic heart disease of native coronary artery without angina pectoris: Secondary | ICD-10-CM | POA: Insufficient documentation

## 2017-03-31 DIAGNOSIS — Z23 Encounter for immunization: Secondary | ICD-10-CM | POA: Insufficient documentation

## 2017-03-31 DIAGNOSIS — H5712 Ocular pain, left eye: Secondary | ICD-10-CM

## 2017-03-31 DIAGNOSIS — F17210 Nicotine dependence, cigarettes, uncomplicated: Secondary | ICD-10-CM | POA: Diagnosis not present

## 2017-03-31 DIAGNOSIS — H16002 Unspecified corneal ulcer, left eye: Secondary | ICD-10-CM | POA: Diagnosis not present

## 2017-03-31 MED ORDER — TETANUS-DIPHTH-ACELL PERTUSSIS 5-2.5-18.5 LF-MCG/0.5 IM SUSP
0.5000 mL | Freq: Once | INTRAMUSCULAR | Status: AC
  Administered 2017-03-31: 0.5 mL via INTRAMUSCULAR
  Filled 2017-03-31: qty 0.5

## 2017-03-31 MED ORDER — TETRACAINE HCL 0.5 % OP SOLN
2.0000 [drp] | Freq: Once | OPHTHALMIC | Status: AC
Start: 2017-03-31 — End: 2017-03-31
  Administered 2017-03-31: 2 [drp] via OPHTHALMIC
  Filled 2017-03-31: qty 4

## 2017-03-31 MED ORDER — SULFACETAMIDE SODIUM 10 % OP OINT: TOPICAL_OINTMENT | Freq: Four times a day (QID) | OPHTHALMIC | 0 refills | Status: AC

## 2017-03-31 MED ORDER — FLUORESCEIN SODIUM 1 MG OP STRP
2.0000 | ORAL_STRIP | Freq: Once | OPHTHALMIC | Status: AC
  Administered 2017-03-31: 2 via OPHTHALMIC
  Filled 2017-03-31: qty 2

## 2017-03-31 NOTE — ED Provider Notes (Signed)
[facility] DEPT Provider Note   CSN: [id number] Arrival date & time: 03/31/17  5361     History   Chief Complaint Chief Complaint  Patient presents with  . Eye Pain    HPI [name] [name] is a 56 y.o. male with a history of hypertension, rheumatoid arthritis, CAD, who presents today for evaluation of approximately 4 days of left eye redness, pain, and swelling. He states that he has had this intermittently for approximately one year. He has not mentioned it to his primary care provider or seen an ophthalmologist.   Chart review shows that he was seen here n 01/02/17 when he was noted to have a dendritic lesion of his eye. Chart review shows that he was given erythromycin ointment, and instructed to follow-up with ophthalmology the next day. It does not appear that he followed up with ophthalmology.    He states that everything through his left eye is extremely blurry. He is able to tell light from dark, however is unable to count fingers or tell what color things are.  This has been unchanged over the last 4 days.   HPI  Past Medical History:  Diagnosis Date  . Anemia   . GERD (gastroesophageal reflux disease)   . Heart murmur   . History of anemia    no current med.  . Hypertension    states is borderline on medication; has been on med. x 5-6 yr.  . Impingement syndrome of shoulder region 10/2014   left  . Lupus   . Pneumonia   . RA (rheumatoid arthritis) ([facility])   recently found out he does not have lupus.  Patient Active Problem List   Diagnosis Date Noted  . Coronary artery disease involving native coronary artery of native heart with unstable angina pectoris ([facility])   . DOE (dyspnea on exertion) 03/13/2017  . Pre-procedure lab exam 03/13/2017  . Screening for colorectal cancer 09/11/2016  . 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 Left shoulder pain 12/16/2015  . Transaminitis 12/16/2015  . Chest pain 12/09/2015  . Lightheadedness 12/08/2015  . Need for prophylactic  vaccination and inoculation against influenza 06/02/2015  . Insomnia 06/01/2015  . Rotator cuff tear 11/04/2014  . Injection site reaction 09/02/2014  . Back muscle spasm 05/11/2014  . Bilateral shoulder pain 08/20/2012  . Tobacco abuse 08/20/2012  . Anemia 08/06/2012  . Dizziness and giddiness, h/o 08/06/2012  . HTN (hypertension) 04/20/2011  . BACK PAIN 10/29/2009  . HLD (hyperlipidemia) 10/08/2009  . Systemic lupus erythematosus ([facility]) 08/11/2009  . Rheumatoid arthritis ([facility]) 08/11/2009    Past Surgical History:  Procedure Laterality Date  . ACHILLES TENDON SURGERY Bilateral   . LEFT HEART CATH AND CORONARY ANGIOGRAPHY N/A 03/15/2017   Procedure: LEFT HEART CATH AND CORONARY ANGIOGRAPHY;  Surgeon: Nelva Bush, MD;  Location: [facility];  Service: Cardiovascular;  Laterality: N/A;  . OSTEOTOMY AND ULNAR SHORTENING Right 07/22/2002  . SHOULDER ARTHROSCOPY WITH BICEPSTENOTOMY Right 03/11/2015   Procedure: SHOULDER ARTHROSCOPY WITH BICEPSTENOTOMY;  Surgeon: Ninetta Lights, MD;  Location: [facility];  Service: Orthopedics;  Laterality: Right;  . SHOULDER ARTHROSCOPY WITH DISTAL CLAVICLE RESECTION Left 11/19/2014   Procedure: SHOULDER ARTHROSCOPY WITH DISTAL CLAVICLE RESECTION;  Surgeon: Kathryne Hitch, MD;  Location: [facility];  Service: Orthopedics;  Laterality: Left;  . SHOULDER ARTHROSCOPY WITH ROTATOR CUFF REPAIR AND SUBACROMIAL DECOMPRESSION Left 11/19/2014   Procedure: LEFT SHOULDER ARTHROSCOPY, DEBRIDEMENT DISTAL CLAVICLE EXCISION, ACROMIOPLASTY WITH ROTATOR CUFF REPAIR ;  Surgeon:  Kathryne Hitch, MD;  Location: [facility];  Service: Orthopedics;  Laterality: Left;  [name] VIDEO ASSISTED THORACOSCOPY (VATS)/DECORTICATION Right 11/21/2011   drainage of empyema       Home Medications    Prior to Admission medications   Medication Sig Start Date End Date Taking? Authorizing Provider  amLODipine (NORVASC) 10 MG tablet TAKE 1 TABLET  (10 MG TOTAL) BY MOUTH DAILY. 02/16/17   Collier Salina, MD  aspirin EC 81 MG EC tablet Take 1 tablet (81 mg total) by mouth daily. 12/10/15   Norman Herrlich, MD  oxyCODONE-acetaminophen (PERCOCET) 10-325 MG tablet Take 1 tablet by mouth 3 (three) times daily.  12/08/15   [provider]  pravastatin (PRAVACHOL) 40 MG tablet Take 40 mg by mouth every morning.    [provider]  sulfacetamide (BLEPH-10) 10 % ophthalmic ointment Place into the left eye 4 (four) times daily. 03/31/17 04/05/17  Lorin Glass, PA-C    Family History Family History  Problem Relation Age of Onset  . Heart disease Father 35  . Stroke Neg Hx   . Cancer Neg Hx     Social History Social History  Substance Use Topics  . Smoking status: Current Every Day Smoker    Packs/day: 0.10    Years: 20.00    Types: Cigarettes  . Smokeless tobacco: Never Used     Comment: 1-2  per cig./day  . Alcohol use No     Allergies   Ramipril [ramipril] and Tramadol   Review of Systems Review of Systems  Constitutional: Negative for activity change, appetite change, chills and fever.  HENT: Negative for congestion, drooling, facial swelling, sinus pain, sinus pressure, sore throat and trouble swallowing.   Eyes: Positive for photophobia, pain, discharge, redness and visual disturbance. Negative for itching.  Neurological: Negative for headaches.  All other systems reviewed and are negative.    Physical Exam Updated Vital Signs BP (!) 152/104 (BP Location: Left Arm)   Pulse 80   Temp 98.2 F (36.8 C) (Oral)   Resp 18   Ht 5\' 8"  (1.727 m)   Wt 83.5 kg (184 lb)   SpO2 94%   BMI 27.98 kg/m   Physical Exam  Constitutional: He appears well-developed and well-nourished. No distress.  HENT:  Head: Normocephalic and atraumatic.  Right Ear: External ear normal.  Left Ear: External ear normal.  Mouth/Throat: Oropharynx is clear and moist.  Eyes: Pupils are equal, round, and reactive to  light. EOM and lids are normal. Right eye exhibits no chemosis and no discharge. Left eye exhibits chemosis and discharge. Right conjunctiva is not injected. Right conjunctiva has no hemorrhage. Left conjunctiva is injected. Left conjunctiva has no hemorrhage.  Slit lamp exam:      The right eye shows no corneal abrasion, no corneal ulcer, no foreign body and no fluorescein uptake.       The left eye shows corneal abrasion, corneal ulcer and fluorescein uptake. The left eye shows no foreign body.  Left eye corneal ulcer located directly over the pupil.  Lymphadenopathy:    He has no cervical adenopathy.  Skin: He is not diaphoretic.  Nursing note and vitals reviewed.    ED Treatments / Results  Labs (all labs ordered are listed, but only abnormal results are displayed) Labs Reviewed - No data to display  EKG  EKG Interpretation None       Radiology No results found.  Procedures Procedures (including critical care time)  Medications  Ordered in ED Medications  Tdap (BOOSTRIX) injection 0.5 mL (not administered)  fluorescein ophthalmic strip 2 strip (2 strips Both Eyes Given 03/31/17 1833)  tetracaine (PONTOCAINE) 0.5 % ophthalmic solution 2 drop (2 drops Both Eyes Given 03/31/17 1833)     Initial Impression / Assessment and Plan / ED Course  I have reviewed the triage vital signs and the nursing notes.  Pertinent labs & imaging results that were available during my care of the patient were reviewed by me and considered in my medical decision making (see chart for details).    Corneal abrasion  Pt with corneal abrasion and ulcer on PE. States that this issue has been waxing and waning over the past year.Tdap given. No evidence of FB.   I suspect his reduced vision is secondary to corneal ulcer located directly over pupil.  Pt is not a contact lens wearer.  Exam non-concerning for orbital cellulitis, hyphema. Patient will be discharged home with Bleph-10 ointment.   Patient  understands to follow up with ophthalmology, & to return to ER if new symptoms develop including change in vision, or entrapment.   At this time there does not appear to be any evidence of an acute emergency medical condition and the patient appears stable for discharge with appropriate outpatient follow up.Diagnosis was discussed with patient who verbalizes understanding and is agreeable to discharge. Pt case discussed with and seen by Dr. Dayna Barker who agrees with my plan.    Final Clinical Impressions(s) / ED Diagnoses   Final diagnoses:  Pain of left eye  Ulcer of left cornea    New Prescriptions New Prescriptions   SULFACETAMIDE (BLEPH-10) 10 % OPHTHALMIC OINTMENT    Place into the left eye 4 (four) times daily.     Ollen Gross 03/31/17 2029    Merrily Pew, MD 03/31/17 [phone]

## 2017-03-31 NOTE — ED Notes (Signed)
Pt requested a ginger ale. OK per RN Candy, pt give the same

## 2017-03-31 NOTE — ED Triage Notes (Signed)
The pt is c/o lt eye lid pain and swelling for 3-4 months  It gets better for awhile then returns  No temp

## 2017-03-31 NOTE — Discharge Instructions (Signed)
Please call the ophthalmologist (eye doctor) on Monday morning for a visit. It is very important that you follow-up with them If you do not follow-up with the eye doctor then you put yourself at risk for permanent vision loss.

## 2017-04-02 ENCOUNTER — Other Ambulatory Visit: Payer: Self-pay

## 2017-04-03 DIAGNOSIS — H11431 Conjunctival hyperemia, right eye: Secondary | ICD-10-CM | POA: Diagnosis not present

## 2017-04-03 DIAGNOSIS — H1711 Central corneal opacity, right eye: Secondary | ICD-10-CM | POA: Diagnosis not present

## 2017-04-03 DIAGNOSIS — H16011 Central corneal ulcer, right eye: Secondary | ICD-10-CM | POA: Diagnosis not present

## 2017-04-03 DIAGNOSIS — H16401 Unspecified corneal neovascularization, right eye: Secondary | ICD-10-CM | POA: Diagnosis not present

## 2017-04-04 DIAGNOSIS — H01022 Squamous blepharitis right lower eyelid: Secondary | ICD-10-CM | POA: Diagnosis not present

## 2017-04-04 DIAGNOSIS — H18462 Peripheral corneal degeneration, left eye: Secondary | ICD-10-CM | POA: Diagnosis not present

## 2017-04-04 DIAGNOSIS — H01024 Squamous blepharitis left upper eyelid: Secondary | ICD-10-CM | POA: Diagnosis not present

## 2017-04-04 DIAGNOSIS — H01025 Squamous blepharitis left lower eyelid: Secondary | ICD-10-CM | POA: Diagnosis not present

## 2017-04-04 DIAGNOSIS — H01021 Squamous blepharitis right upper eyelid: Secondary | ICD-10-CM | POA: Diagnosis not present

## 2017-04-05 DIAGNOSIS — R7989 Other specified abnormal findings of blood chemistry: Secondary | ICD-10-CM | POA: Diagnosis not present

## 2017-04-05 DIAGNOSIS — G894 Chronic pain syndrome: Secondary | ICD-10-CM | POA: Diagnosis not present

## 2017-04-05 DIAGNOSIS — R5383 Other fatigue: Secondary | ICD-10-CM | POA: Diagnosis not present

## 2017-04-05 DIAGNOSIS — M0579 Rheumatoid arthritis with rheumatoid factor of multiple sites without organ or systems involvement: Secondary | ICD-10-CM | POA: Diagnosis not present

## 2017-04-05 DIAGNOSIS — Z9119 Patient's noncompliance with other medical treatment and regimen: Secondary | ICD-10-CM | POA: Diagnosis not present

## 2017-04-06 DIAGNOSIS — H01021 Squamous blepharitis right upper eyelid: Secondary | ICD-10-CM | POA: Diagnosis not present

## 2017-04-06 DIAGNOSIS — H01024 Squamous blepharitis left upper eyelid: Secondary | ICD-10-CM | POA: Diagnosis not present

## 2017-04-06 DIAGNOSIS — H01025 Squamous blepharitis left lower eyelid: Secondary | ICD-10-CM | POA: Diagnosis not present

## 2017-04-06 DIAGNOSIS — H18462 Peripheral corneal degeneration, left eye: Secondary | ICD-10-CM | POA: Diagnosis not present

## 2017-04-06 DIAGNOSIS — H01022 Squamous blepharitis right lower eyelid: Secondary | ICD-10-CM | POA: Diagnosis not present

## 2017-04-19 DIAGNOSIS — M25559 Pain in unspecified hip: Secondary | ICD-10-CM | POA: Diagnosis not present

## 2017-04-19 DIAGNOSIS — G894 Chronic pain syndrome: Secondary | ICD-10-CM | POA: Diagnosis not present

## 2017-04-19 DIAGNOSIS — M069 Rheumatoid arthritis, unspecified: Secondary | ICD-10-CM | POA: Diagnosis not present

## 2017-04-19 DIAGNOSIS — M47817 Spondylosis without myelopathy or radiculopathy, lumbosacral region: Secondary | ICD-10-CM | POA: Diagnosis not present

## 2017-04-25 ENCOUNTER — Ambulatory Visit: Payer: Medicare Other | Admitting: Cardiology

## 2017-04-30 ENCOUNTER — Encounter: Payer: Self-pay | Admitting: Internal Medicine

## 2017-04-30 ENCOUNTER — Ambulatory Visit (INDEPENDENT_AMBULATORY_CARE_PROVIDER_SITE_OTHER): Payer: Medicare Other | Admitting: Internal Medicine

## 2017-04-30 VITALS — BP 146/89 | HR 94 | Temp 97.6°F | Ht 68.0 in | Wt 186.2 lb

## 2017-04-30 DIAGNOSIS — Z9861 Coronary angioplasty status: Secondary | ICD-10-CM | POA: Diagnosis not present

## 2017-04-30 DIAGNOSIS — M545 Low back pain: Secondary | ICD-10-CM | POA: Diagnosis not present

## 2017-04-30 DIAGNOSIS — F17200 Nicotine dependence, unspecified, uncomplicated: Secondary | ICD-10-CM

## 2017-04-30 DIAGNOSIS — Z0000 Encounter for general adult medical examination without abnormal findings: Secondary | ICD-10-CM | POA: Diagnosis not present

## 2017-04-30 DIAGNOSIS — G8929 Other chronic pain: Secondary | ICD-10-CM

## 2017-04-30 DIAGNOSIS — I25110 Atherosclerotic heart disease of native coronary artery with unstable angina pectoris: Secondary | ICD-10-CM

## 2017-04-30 DIAGNOSIS — M0579 Rheumatoid arthritis with rheumatoid factor of multiple sites without organ or systems involvement: Secondary | ICD-10-CM

## 2017-04-30 DIAGNOSIS — F17210 Nicotine dependence, cigarettes, uncomplicated: Secondary | ICD-10-CM

## 2017-04-30 DIAGNOSIS — Z23 Encounter for immunization: Secondary | ICD-10-CM

## 2017-04-30 DIAGNOSIS — Z79891 Long term (current) use of opiate analgesic: Secondary | ICD-10-CM | POA: Diagnosis not present

## 2017-04-30 DIAGNOSIS — H547 Unspecified visual loss: Secondary | ICD-10-CM

## 2017-04-30 NOTE — Patient Instructions (Addendum)
It was a pleasure to see you today [name] [name].  I will review our records and previous imaging that may have been obtained at the orthopedist office or your pain clinic and refer you to orthopedic surgery once I can review this.  I am glad you are doing so well. Continue staying active in your daily life and in the community.  Let us know if and when you need any medication refills.  Keep working on smoking reduction, the less the better for your bone health. This would be especially important if pursuing back surgery.

## 2017-04-30 NOTE — Progress Notes (Signed)
Subjective:   [name] [name] is a 56 y.o. male who presents for a Medicare Annual Wellness Visit.  The following items have been reviewed and updated today in the appropriate area in the EMR.   Health Risk Assessment  Height, weight, BMI, and BP- Blood pressure is mildly elevated today, previously well controlled  Visual acuity- Actively following with [facility] for decreased left visual acuity  Depression screen- PHQ2 = 0  Fall risk / safety level- He is having trouble traversing stairs and lives in a 2 level, 6 bedroom home with 16 steps. No falls to date.  Medical and family history were reviewed and updated  Updating list of other providers & suppliers Dr. Katy Fitch- ophthalmology Dr. Trudie Reed- Rheumatology  Cognitive screen MiniCog entirely normal, no impairment in IADLs  Written screening schedule Provided to patient, influenza immunization today  Risk Factor list- Continued smoking ~3 cigarettes/day with extensive history Decreased physical activity no regular exercise 2/2 back pain Chronic long term opioid medication use for back pain  Personalized health advice, risky behaviors, and treatment advice- Recommended continuing to work on smoking cessation, last abstinent in 2016    Objective:    Vitals: BP (!) 146/89 (BP Location: Left Arm, Patient Position: Sitting, Cuff Size: Large)   Pulse 94   Temp 97.6 F (36.4 C) (Oral)   Ht 5\' 8"  (1.727 m)   Wt 186 lb 3.2 oz (84.5 kg)   SpO2 96% Comment: room air  BMI 28.31 kg/m   Activities of Daily Living In your present state of health, do you have any difficulty performing the following activities: 04/30/2017 03/15/2017  Hearing? N N  Vision? N N  Comment left eye infection .  Clearing up -  Difficulty concentrating or making decisions? N N  Walking or climbing stairs? Y N  Comment back pain -  Dressing or bathing? N N  Doing errands, shopping? N -  Some recent data might be hidden    Goals Goals      . Quit smoking / using tobacco       Fall Risk Fall Risk  04/30/2017 09/08/2016 02/25/2016 01/07/2016 12/16/2015  Falls in the past year? No Yes Yes Yes No  Number falls in past yr: - 1 1 1  -  Injury with Fall? - No No No -  Risk for fall due to : - Medication side effect History of fall(s);Medication side effect History of fall(s);Other (Comment) -  Risk for fall due to: Comment - - - elevated blood pressure -  Follow up - Falls prevention discussed - Falls prevention discussed -    Depression Screen PHQ 2/9 Scores 04/30/2017 09/08/2016 02/25/2016 01/07/2016  PHQ - 2 Score 0 0 0 0     Cognitive Testing I assessed the patient for cognitive issues and the patient did not have issues with his / her cognition on Mini-Cog.  Mini-Cog - 05/01/17 0928    Normal clock drawing test?  yes    How many words correct?  3       Assessment and Plan:    During the course of the visit the patient was educated and counseled about appropriate screening and preventive services as documented in the assessment and plan.  The printed AVS was given to the patient and included an updated screening schedule, a list of risk factors, and personalized health advice.    Preventative health care Flu vaccine provided today  Tobacco use disorder He continues to smoke but has decreased  daily use to about 3 cigarettes per day. He has a long history and was last stopped completely in 2016. He had a relapse back to 2ppd smoking use due to life stressors but has weaned himself down since that time. He is adamant that he does not need or want pharmacological assistance and is making progress on his own. I discussed that this remains a high risk factor for bone, heart, and lung disease especially with his rheumatological medications.  Low back pain without sciatica HPI: He has a very chronic low back pain that has bothered him for years. He is currently seen at [facility] on treatment with  oral oxycodone 6 pills daily and has tried [facility] repeatedly within the past 16 months. His back pain is severe enough that his physical activity is now decreased from about 4 miles to 1 mile per day of walking. He enjoys participating in deer hunting around this time of year but has had difficulty doing so. He has a previous relationship with [place] for his previous shoulder surgeries and requests a referral to them and wants to discuss if back surgery is an option for him. A: Chronic low back pain without radiculopathy. His symptoms do seem severe enough despite very considerable medical management through a pain specialist that surgery might be a significant benefit. P: Referral to orthopedic surgery placed today.  Coronary artery disease involving native coronary artery of native heart with unstable angina pectoris [facility]) He had a recent heart catheterization that showed nonobstructive CAD. Continued medical management was recommended at outpatient cardiology visit.     Hinton Lovely, MD  05/01/2017

## 2017-05-01 ENCOUNTER — Encounter: Payer: Self-pay | Admitting: Internal Medicine

## 2017-05-01 NOTE — Assessment & Plan Note (Addendum)
HPI: He has a very chronic low back pain that has bothered him for years. He is currently seen at [facility] on treatment with oral oxycodone 6 pills daily and has tried [facility] repeatedly within the past 16 months. His back pain is severe enough that his physical activity is now decreased from about 4 miles to 1 mile per day of walking. He enjoys participating in deer hunting around this time of year but has had difficulty doing so. He has a previous relationship with [place] for his previous shoulder surgeries and requests a referral to them and wants to discuss if back surgery is an option for him. A: Chronic low back pain without radiculopathy. His symptoms do seem severe enough despite very considerable medical management through a pain specialist that surgery might be a significant benefit. P: Referral to orthopedic surgery placed today.

## 2017-05-01 NOTE — Assessment & Plan Note (Signed)
Flu vaccine provided today.  

## 2017-05-01 NOTE — Assessment & Plan Note (Addendum)
He continues to smoke but has decreased daily use to about 3 cigarettes per day. He has a long history and was last stopped completely in 2016. He had a relapse back to 2ppd smoking use due to life stressors but has weaned himself down since that time. He is adamant that he does not need or want pharmacological assistance and is making progress on his own. I discussed that this remains a high risk factor for bone, heart, and lung disease especially with his rheumatological medications.

## 2017-05-01 NOTE — Assessment & Plan Note (Signed)
He had a recent heart catheterization that showed nonobstructive CAD. Continued medical management was recommended at outpatient cardiology visit.

## 2017-05-02 NOTE — Progress Notes (Signed)
Internal Medicine Clinic Attending  Case discussed with Dr. Rice at the time of the visit.  We reviewed the resident's history and exam and pertinent patient test results.  I agree with the assessment, diagnosis, and plan of care documented in the resident's note.  

## 2017-05-09 ENCOUNTER — Other Ambulatory Visit: Payer: Self-pay | Admitting: Sports Medicine

## 2017-05-09 DIAGNOSIS — M545 Low back pain: Secondary | ICD-10-CM | POA: Diagnosis not present

## 2017-05-16 DIAGNOSIS — M0579 Rheumatoid arthritis with rheumatoid factor of multiple sites without organ or systems involvement: Secondary | ICD-10-CM | POA: Diagnosis not present

## 2017-05-16 DIAGNOSIS — R5383 Other fatigue: Secondary | ICD-10-CM | POA: Diagnosis not present

## 2017-05-16 DIAGNOSIS — G894 Chronic pain syndrome: Secondary | ICD-10-CM | POA: Diagnosis not present

## 2017-05-16 DIAGNOSIS — R7989 Other specified abnormal findings of blood chemistry: Secondary | ICD-10-CM | POA: Diagnosis not present

## 2017-05-16 DIAGNOSIS — Z9119 Patient's noncompliance with other medical treatment and regimen: Secondary | ICD-10-CM | POA: Diagnosis not present

## 2017-05-21 ENCOUNTER — Ambulatory Visit
Admission: RE | Admit: 2017-05-21 | Discharge: 2017-05-21 | Disposition: A | Discharge status: Home / Self Care | Payer: Medicare Other | Source: Ambulatory Visit | Attending: Sports Medicine | Admitting: Sports Medicine

## 2017-05-21 DIAGNOSIS — M545 Low back pain: Secondary | ICD-10-CM

## 2017-05-21 DIAGNOSIS — M48061 Spinal stenosis, lumbar region without neurogenic claudication: Secondary | ICD-10-CM | POA: Diagnosis not present

## 2017-05-24 DIAGNOSIS — M5137 Other intervertebral disc degeneration, lumbosacral region: Secondary | ICD-10-CM | POA: Diagnosis not present

## 2017-05-24 DIAGNOSIS — G894 Chronic pain syndrome: Secondary | ICD-10-CM | POA: Diagnosis not present

## 2017-05-24 DIAGNOSIS — M069 Rheumatoid arthritis, unspecified: Secondary | ICD-10-CM | POA: Diagnosis not present

## 2017-05-24 DIAGNOSIS — M47817 Spondylosis without myelopathy or radiculopathy, lumbosacral region: Secondary | ICD-10-CM | POA: Diagnosis not present

## 2017-05-28 DIAGNOSIS — H01024 Squamous blepharitis left upper eyelid: Secondary | ICD-10-CM | POA: Diagnosis not present

## 2017-05-28 DIAGNOSIS — H16041 Marginal corneal ulcer, right eye: Secondary | ICD-10-CM | POA: Diagnosis not present

## 2017-05-28 DIAGNOSIS — H01021 Squamous blepharitis right upper eyelid: Secondary | ICD-10-CM | POA: Diagnosis not present

## 2017-05-28 DIAGNOSIS — H16012 Central corneal ulcer, left eye: Secondary | ICD-10-CM | POA: Diagnosis not present

## 2017-05-28 DIAGNOSIS — H01022 Squamous blepharitis right lower eyelid: Secondary | ICD-10-CM | POA: Diagnosis not present

## 2017-06-11 DIAGNOSIS — H16043 Marginal corneal ulcer, bilateral: Secondary | ICD-10-CM | POA: Diagnosis not present

## 2017-06-11 DIAGNOSIS — H04123 Dry eye syndrome of bilateral lacrimal glands: Secondary | ICD-10-CM | POA: Diagnosis not present

## 2017-06-11 DIAGNOSIS — H16013 Central corneal ulcer, bilateral: Secondary | ICD-10-CM | POA: Diagnosis not present

## 2017-06-13 DIAGNOSIS — Z79891 Long term (current) use of opiate analgesic: Secondary | ICD-10-CM | POA: Diagnosis not present

## 2017-06-13 DIAGNOSIS — Z79899 Other long term (current) drug therapy: Secondary | ICD-10-CM | POA: Diagnosis not present

## 2017-06-13 DIAGNOSIS — M79604 Pain in right leg: Secondary | ICD-10-CM | POA: Diagnosis not present

## 2017-06-13 DIAGNOSIS — M47817 Spondylosis without myelopathy or radiculopathy, lumbosacral region: Secondary | ICD-10-CM | POA: Diagnosis not present

## 2017-06-13 DIAGNOSIS — G894 Chronic pain syndrome: Secondary | ICD-10-CM | POA: Diagnosis not present

## 2017-06-13 DIAGNOSIS — M545 Low back pain: Secondary | ICD-10-CM | POA: Diagnosis not present

## 2017-07-10 ENCOUNTER — Encounter: Payer: Self-pay | Admitting: Internal Medicine

## 2017-07-10 ENCOUNTER — Ambulatory Visit (INDEPENDENT_AMBULATORY_CARE_PROVIDER_SITE_OTHER): Payer: Medicare Other | Admitting: Internal Medicine

## 2017-07-10 ENCOUNTER — Other Ambulatory Visit: Payer: Self-pay

## 2017-07-10 VITALS — BP 165/96 | HR 103 | Temp 98.2°F | Ht 68.0 in | Wt 198.5 lb

## 2017-07-10 DIAGNOSIS — R51 Headache: Secondary | ICD-10-CM | POA: Diagnosis not present

## 2017-07-10 DIAGNOSIS — H538 Other visual disturbances: Secondary | ICD-10-CM

## 2017-07-10 DIAGNOSIS — I10 Essential (primary) hypertension: Secondary | ICD-10-CM | POA: Diagnosis not present

## 2017-07-10 DIAGNOSIS — I2510 Atherosclerotic heart disease of native coronary artery without angina pectoris: Secondary | ICD-10-CM

## 2017-07-10 DIAGNOSIS — F17210 Nicotine dependence, cigarettes, uncomplicated: Secondary | ICD-10-CM | POA: Diagnosis not present

## 2017-07-10 DIAGNOSIS — Z79899 Other long term (current) drug therapy: Secondary | ICD-10-CM

## 2017-07-10 DIAGNOSIS — E785 Hyperlipidemia, unspecified: Secondary | ICD-10-CM

## 2017-07-10 DIAGNOSIS — M069 Rheumatoid arthritis, unspecified: Secondary | ICD-10-CM | POA: Diagnosis not present

## 2017-07-10 MED ORDER — CHLORTHALIDONE 25 MG PO TABS: 25.0000 mg | ORAL_TABLET | Freq: Every day | ORAL | 2 refills | Status: DC

## 2017-07-10 NOTE — Progress Notes (Signed)
   CC: Hypertension  HPI:  [name] [name] is a 57 y.o. with a PMH of HTN, RA, CAD, HLD, presenting to clinic for evaluation of hypertension.  Patient with intermittently uncontrolled HTN in the past, currently on amlodipine 10mg  daily. He states he had fairly good control of his BP until Christmas day; he felt weak, some dizziness, and headache and reports BP being in 200s. Since then, he reports consistently elevated BP readings with only a day or so where it has been wnl. He endorses resolution of the fatigue and dizziness, but does report a frontal headache whose severity changes with BP elevation. He denies chest pain, shortness of breath, palpitations, falls, nausea, vomiting. He does report new onset of floaters in his vision but no new blurry vision, diplopia or focal field loss.   Please see problem based Assessment and Plan for status of patients chronic conditions.  Past Medical History:  Diagnosis Date  . Anemia   . GERD (gastroesophageal reflux disease)   . Heart murmur   . History of anemia    no current med.  . Hypertension    states is borderline on medication; has been on med. x 5-6 yr.  . Impingement syndrome of shoulder region 10/2014   left  . Lupus   . Pneumonia   . RA (rheumatoid arthritis) (HCC)     Review of Systems:   ROS Per HPI  Physical Exam:  Vitals:   07/10/17 1554  BP: (!) 165/96  Pulse: (!) 103  Temp: 98.2 F (36.8 C)  TempSrc: Oral  SpO2: 95%  Weight: 198 lb 8 oz (90 kg)  Height: 5\' 8"  (1.727 m)   GENERAL- alert, co-operative, appears as stated age, not in any distress. HEENT-  PERRL, EOMI CARDIAC- RRR, no murmurs, rubs or gallops. RESP- Moving equal volumes of air, and clear to auscultation bilaterally, no wheezes or crackles. ABDOMEN- Soft, nontender, bowel sounds present. NEURO- CN 2-12 intact. EXTREMITIES- pulse 2+, symmetric, no pedal edema. SKIN- Warm, dry, no rash or lesion. PSYCH- Normal mood and affect, appropriate thought  content and speech.  Assessment & Plan:   See Encounters Tab for problem based charting.   Patient discussed with Dr. Nilsa Nutting, MD Internal Medicine PGY2

## 2017-07-10 NOTE — Patient Instructions (Signed)
For your blood pressure, continue taking Amlodipine 10mg  daily. Start taking Chlorthalidone 25mg  daily. This will also help with your swelling.  Please make an appointment to be seen in 2 weeks so we can check your blood work and see how your blood pressure is doing.  If you start having chest pain, shortness of breath, focal weakness/numbness in one side of your body, please go to the ED for evaluation.

## 2017-07-11 ENCOUNTER — Encounter: Payer: Self-pay | Admitting: Internal Medicine

## 2017-07-11 NOTE — Assessment & Plan Note (Addendum)
Uncontrolled, symptomatic with headache. No s/s of end-organ damage.  Plan: --continue amlodipine 10mg  daily --start chlorthalidone 25mg  daily --f/u in 2-4 weeks for BP check and Bmet; rtc sooner if new symptoms arise

## 2017-07-12 NOTE — Progress Notes (Signed)
Internal Medicine Clinic Attending  Case discussed with Dr. Svalina  at the time of the visit.  We reviewed the resident's history and exam and pertinent patient test results.  I agree with the assessment, diagnosis, and plan of care documented in the resident's note.  

## 2017-07-15 IMAGING — CT CT HEAD W/O CM
3 of 4 series · 17 of 47 positions shown, 20 images · non-contrast
Comparison: Head CT 04/09/2000

CLINICAL DATA: Dizziness with altered gait today. History of
hypertension. Initial encounter.

EXAM:
CT HEAD WITHOUT CONTRAST
TECHNIQUE: Contiguous axial images were obtained from the base of the skull
through the vertex without intravenous contrast.

[Series 201: head w/o, idose (1) · axial · non-contrast · 0.47mm/px · z∈[+533,+673]mm · 11 of 34 slices shown, 14 images]
[im 3/34  brain]
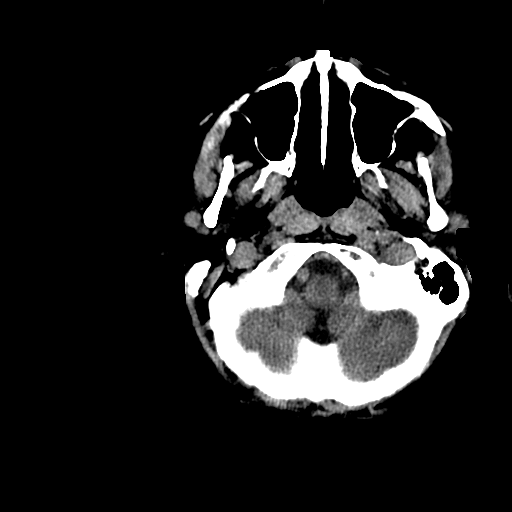
[im 3/34  bone]
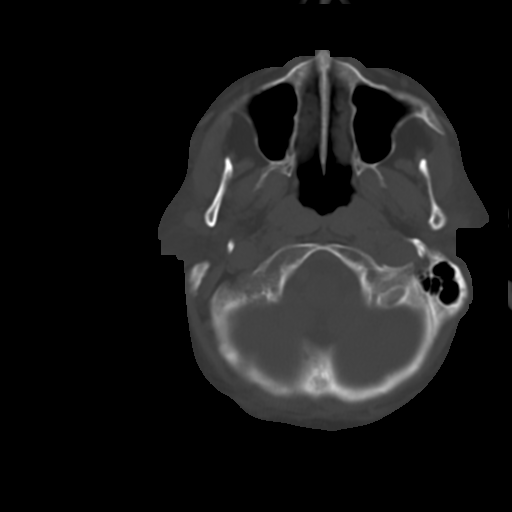
[im 5/34  brain]
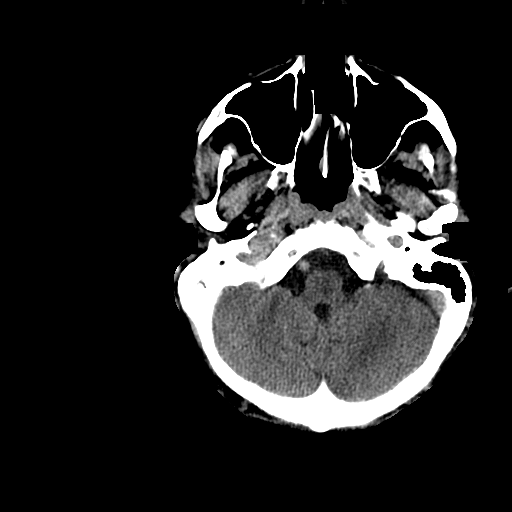
[im 8/34  brain]
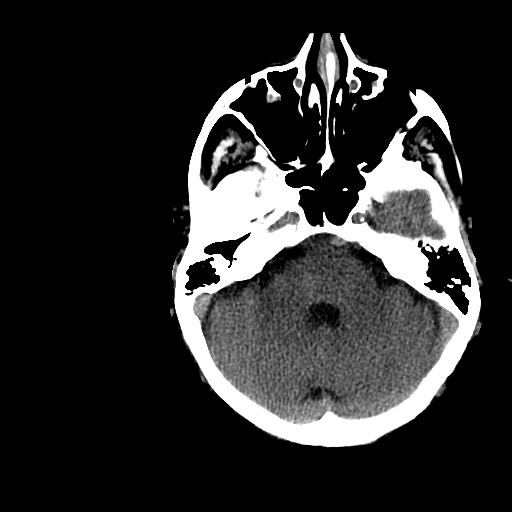
[im 12/34  brain]
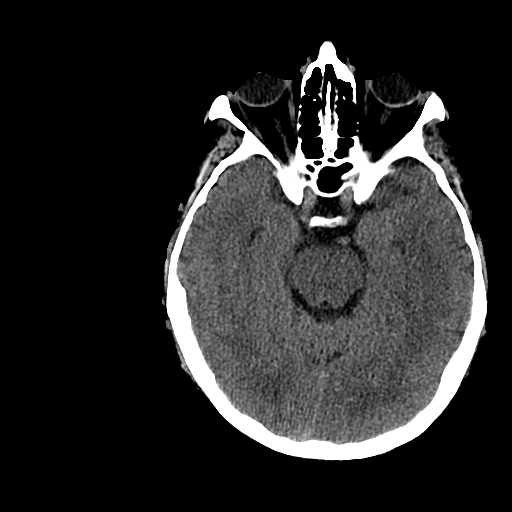
[im 15/34  brain]
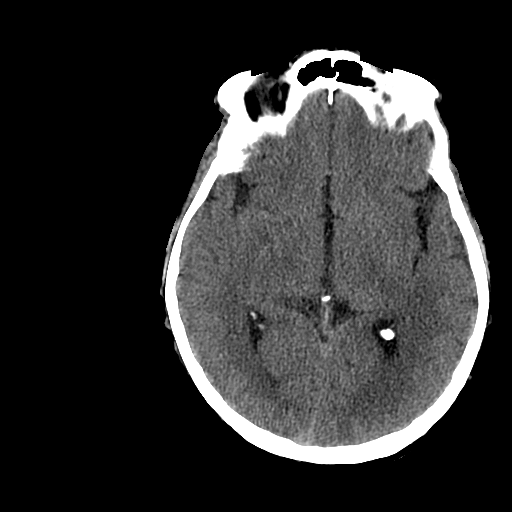
[im 15/34  bone]
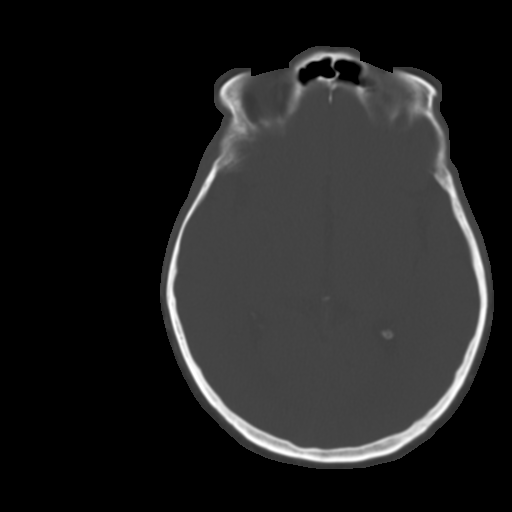
[im 17/34  brain]
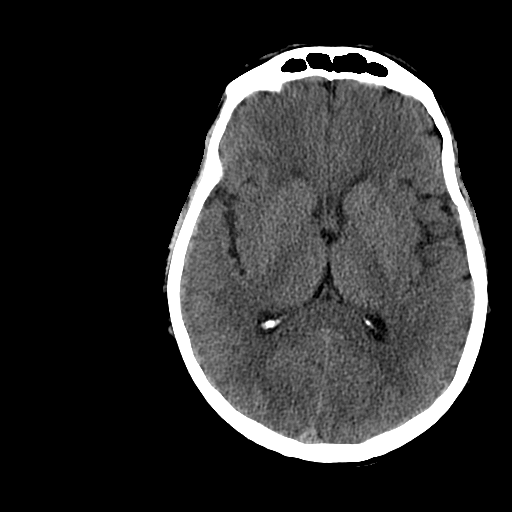
[im 19/34  brain]
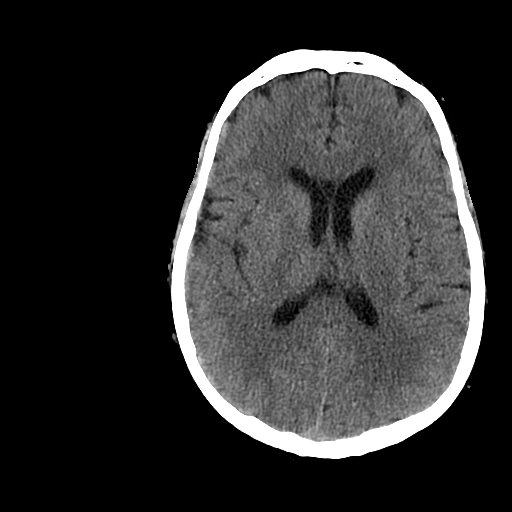
[im 22/34  brain]
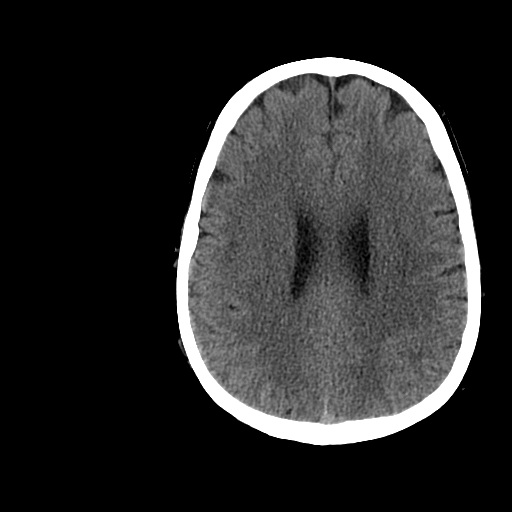
[im 26/34  brain]
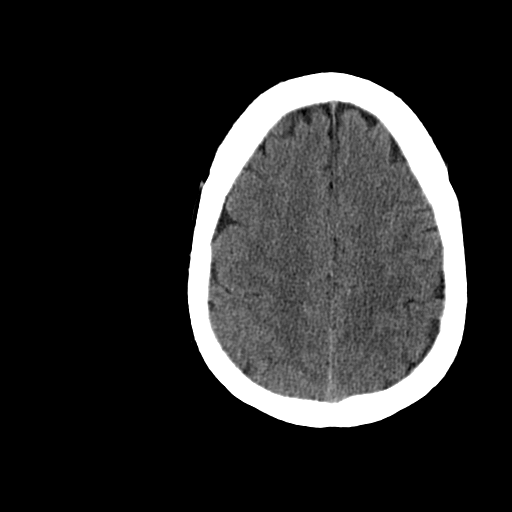
[im 26/34  bone]
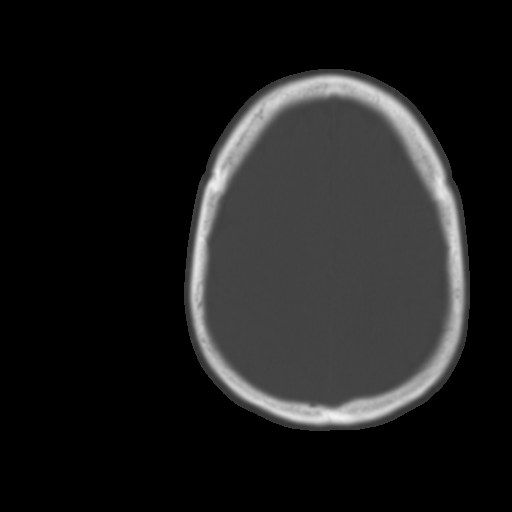
[im 29/34  brain]
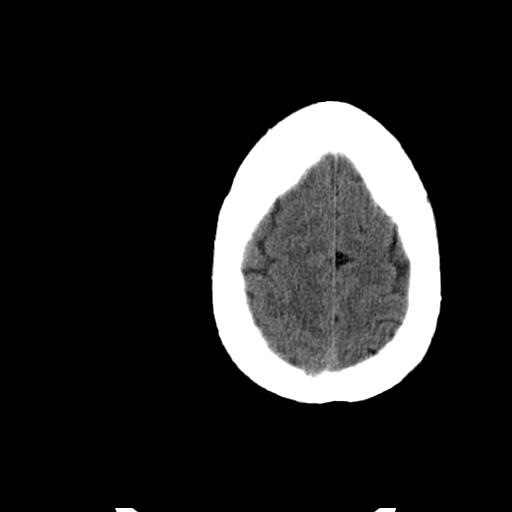
[im 31/34  brain]
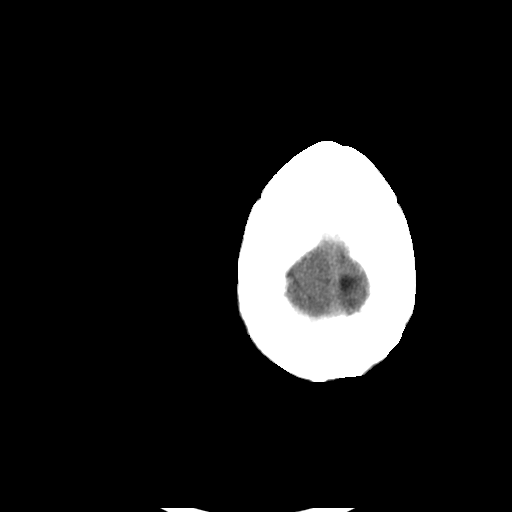

[Series 203: coronal st, idose (1) · coronal · 0.40mm/px · 3 of 79 slices shown]
[im 27/79  brain]
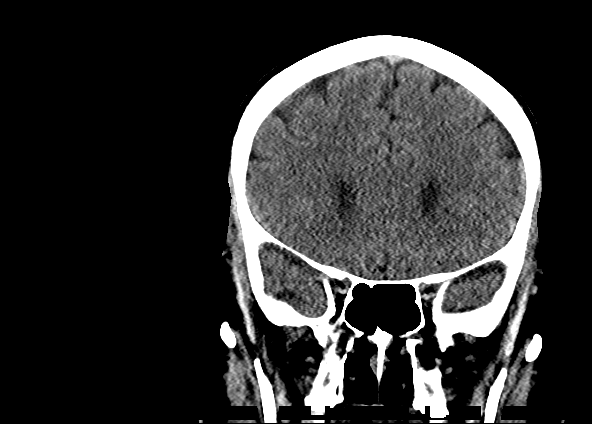
[im 35/79  brain]
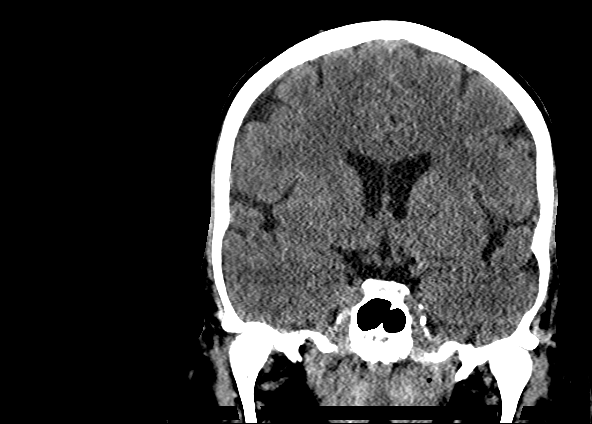
[im 44/79  brain]
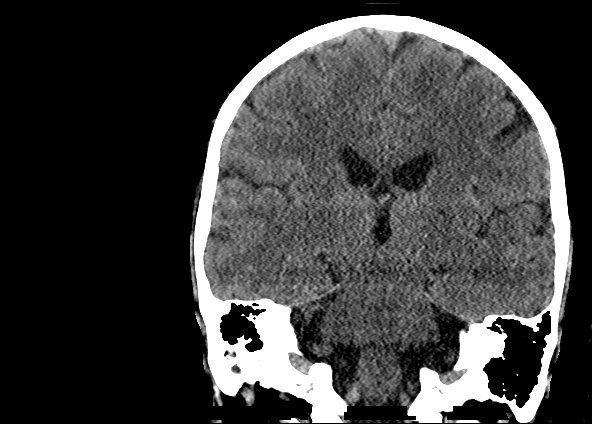

[Series 204: sagittal st, idose (1) · sagittal · 0.40mm/px · 3 of 79 slices shown]
[im 27/79  brain]
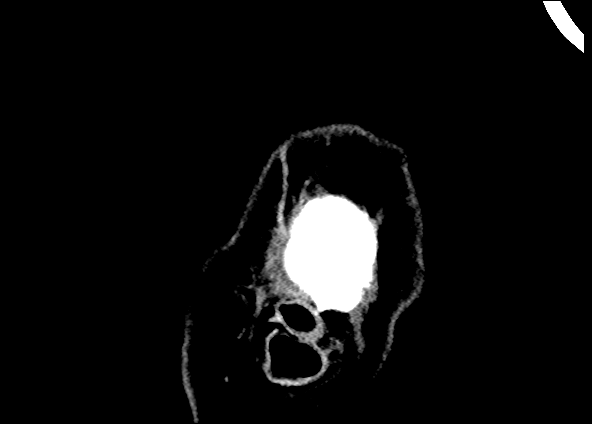
[im 40/79  brain]
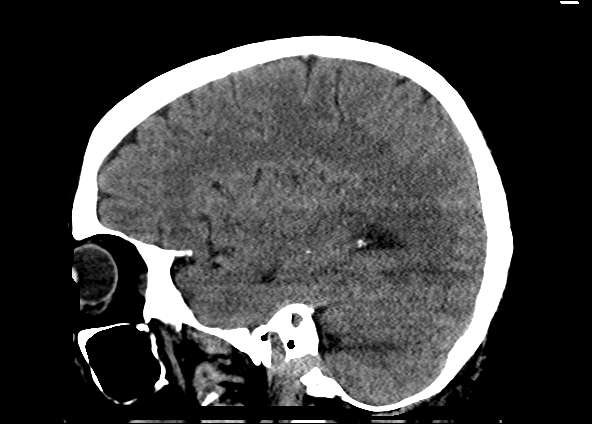
[im 53/79  brain]
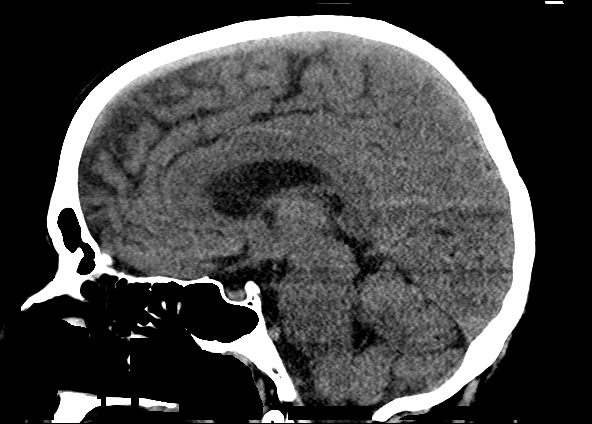

[17 of 47 positions shown; findings below may reference images not displayed]

FINDINGS: Brain: There is no evidence of acute intracranial hemorrhage, mass
lesion, brain edema or extra-axial fluid collection. The ventricles
and subarachnoid spaces are appropriately sized for age. There is no
CT evidence of acute cortical infarction.

Bones/sinuses/visualized face: There is a stable small mucous
retention cyst or polyp in the right maxillary sinus. The visualized
paranasal sinuses, mastoid air cells and middle ears are otherwise
clear. The calvarium is intact.
IMPRESSION: Stable head CT.  No acute intracranial findings.

## 2017-07-16 DIAGNOSIS — M47817 Spondylosis without myelopathy or radiculopathy, lumbosacral region: Secondary | ICD-10-CM | POA: Diagnosis not present

## 2017-07-16 DIAGNOSIS — Z79899 Other long term (current) drug therapy: Secondary | ICD-10-CM | POA: Diagnosis not present

## 2017-07-16 DIAGNOSIS — Z79891 Long term (current) use of opiate analgesic: Secondary | ICD-10-CM | POA: Diagnosis not present

## 2017-07-16 DIAGNOSIS — M069 Rheumatoid arthritis, unspecified: Secondary | ICD-10-CM | POA: Diagnosis not present

## 2017-07-16 DIAGNOSIS — M25519 Pain in unspecified shoulder: Secondary | ICD-10-CM | POA: Diagnosis not present

## 2017-07-16 DIAGNOSIS — G894 Chronic pain syndrome: Secondary | ICD-10-CM | POA: Diagnosis not present

## 2017-07-16 DIAGNOSIS — M5136 Other intervertebral disc degeneration, lumbar region: Secondary | ICD-10-CM | POA: Diagnosis not present

## 2017-07-17 DIAGNOSIS — M5126 Other intervertebral disc displacement, lumbar region: Secondary | ICD-10-CM | POA: Diagnosis not present

## 2017-07-17 DIAGNOSIS — I10 Essential (primary) hypertension: Secondary | ICD-10-CM | POA: Diagnosis not present

## 2017-07-17 DIAGNOSIS — M5416 Radiculopathy, lumbar region: Secondary | ICD-10-CM | POA: Diagnosis not present

## 2017-07-18 DIAGNOSIS — H01021 Squamous blepharitis right upper eyelid: Secondary | ICD-10-CM | POA: Diagnosis not present

## 2017-07-18 DIAGNOSIS — H11151 Pinguecula, right eye: Secondary | ICD-10-CM | POA: Diagnosis not present

## 2017-07-18 DIAGNOSIS — H04123 Dry eye syndrome of bilateral lacrimal glands: Secondary | ICD-10-CM | POA: Diagnosis not present

## 2017-07-18 DIAGNOSIS — H1712 Central corneal opacity, left eye: Secondary | ICD-10-CM | POA: Diagnosis not present

## 2017-07-18 DIAGNOSIS — H17823 Peripheral opacity of cornea, bilateral: Secondary | ICD-10-CM | POA: Diagnosis not present

## 2017-07-26 DIAGNOSIS — M150 Primary generalized (osteo)arthritis: Secondary | ICD-10-CM | POA: Diagnosis not present

## 2017-07-26 DIAGNOSIS — M0579 Rheumatoid arthritis with rheumatoid factor of multiple sites without organ or systems involvement: Secondary | ICD-10-CM | POA: Diagnosis not present

## 2017-07-26 DIAGNOSIS — G894 Chronic pain syndrome: Secondary | ICD-10-CM | POA: Diagnosis not present

## 2017-07-31 DIAGNOSIS — M5416 Radiculopathy, lumbar region: Secondary | ICD-10-CM | POA: Diagnosis not present

## 2017-08-02 DIAGNOSIS — M5416 Radiculopathy, lumbar region: Secondary | ICD-10-CM | POA: Diagnosis not present

## 2017-08-03 ENCOUNTER — Other Ambulatory Visit: Payer: Self-pay | Admitting: Neurosurgery

## 2017-08-03 ENCOUNTER — Other Ambulatory Visit (HOSPITAL_COMMUNITY): Payer: Self-pay | Admitting: Neurosurgery

## 2017-08-03 DIAGNOSIS — M5416 Radiculopathy, lumbar region: Secondary | ICD-10-CM

## 2017-08-10 ENCOUNTER — Ambulatory Visit (HOSPITAL_COMMUNITY)
Admission: RE | Admit: 2017-08-10 | Discharge: 2017-08-10 | Disposition: A | Discharge status: Home / Self Care | Payer: Medicare Other | Source: Ambulatory Visit | Attending: Neurosurgery | Admitting: Neurosurgery

## 2017-08-10 DIAGNOSIS — M5416 Radiculopathy, lumbar region: Secondary | ICD-10-CM

## 2017-08-10 DIAGNOSIS — G548 Other nerve root and plexus disorders: Secondary | ICD-10-CM | POA: Insufficient documentation

## 2017-08-10 DIAGNOSIS — M48061 Spinal stenosis, lumbar region without neurogenic claudication: Secondary | ICD-10-CM | POA: Insufficient documentation

## 2017-08-10 DIAGNOSIS — M5116 Intervertebral disc disorders with radiculopathy, lumbar region: Secondary | ICD-10-CM | POA: Diagnosis not present

## 2017-08-10 DIAGNOSIS — M4846XA Fatigue fracture of vertebra, lumbar region, initial encounter for fracture: Secondary | ICD-10-CM | POA: Insufficient documentation

## 2017-08-10 DIAGNOSIS — M5126 Other intervertebral disc displacement, lumbar region: Secondary | ICD-10-CM | POA: Diagnosis not present

## 2017-08-10 MED ORDER — IOPAMIDOL (ISOVUE-M 200) INJECTION 41%
20.0000 mL | Freq: Once | INTRAMUSCULAR | Status: AC
  Administered 2017-08-10: 20 mL via INTRATHECAL

## 2017-08-10 MED ORDER — DIAZEPAM 5 MG PO TABS
10.0000 mg | ORAL_TABLET | Freq: Once | ORAL | Status: AC
  Administered 2017-08-10: 10 mg via ORAL

## 2017-08-10 MED ORDER — LIDOCAINE HCL (PF) 1 % IJ SOLN
5.0000 mL | Freq: Once | INTRAMUSCULAR | Status: AC
  Administered 2017-08-10: 5 mL via INTRADERMAL

## 2017-08-10 MED ORDER — DIAZEPAM 5 MG PO TABS
ORAL_TABLET | ORAL | Status: AC
  Administered 2017-08-10: 10 mg via ORAL
  Filled 2017-08-10: qty 2

## 2017-08-10 MED ORDER — ONDANSETRON HCL 4 MG/2ML IJ SOLN: 4.0000 mg | Freq: Four times a day (QID) | INTRAMUSCULAR | Status: DC | PRN

## 2017-08-10 NOTE — Discharge Instructions (Signed)
**Note -identified via Obfuscation** Myelogram, Care After °These instructions give you information about caring for yourself after your procedure. Your doctor may also give you more specific instructions. Call your doctor if you have any problems or questions after your procedure. °Follow these instructions at home: °· Drink enough fluid to keep your pee (urine) clear or pale yellow. °· Rest as told by your doctor. °· Lie flat with your head slightly raised (elevated). °· Do not bend, lift, or do any hard activities for 24-48 hours or as told by your doctor. °· Take over-the-counter and prescription medicines only as told by your doctor. °· Take care of and remove your bandage (dressing) as told by your doctor. °· Bathe or shower as told by your doctor. °Contact a health care provider if: °· You have a fever. °· You have a headache that lasts longer than 24 hours. °· You feel sick to your stomach (nauseous). °· You throw up (vomit). °· Your neck is stiff. °· Your legs feel numb. °· You cannot pee. °· You cannot poop (have a bowel movement). °· You have a rash. °· You are itchy or sneezing. °Get help right away if: °· You have new symptoms or your symptoms get worse. °· You have a seizure. °· You have trouble breathing. °This information is not intended to replace advice given to you by your health care provider. Make sure you discuss any questions you have with your health care provider. °Document Released: 03/21/2008 Document Revised: 02/10/2016 Document Reviewed: 03/25/2015 °Elsevier Interactive Patient Education © 2018 [employer] ° °

## 2017-08-13 ENCOUNTER — Other Ambulatory Visit: Payer: Self-pay | Admitting: Internal Medicine

## 2017-08-13 DIAGNOSIS — G894 Chronic pain syndrome: Secondary | ICD-10-CM | POA: Diagnosis not present

## 2017-08-13 DIAGNOSIS — I10 Essential (primary) hypertension: Secondary | ICD-10-CM

## 2017-08-13 DIAGNOSIS — M5137 Other intervertebral disc degeneration, lumbosacral region: Secondary | ICD-10-CM | POA: Diagnosis not present

## 2017-08-13 DIAGNOSIS — M069 Rheumatoid arthritis, unspecified: Secondary | ICD-10-CM | POA: Diagnosis not present

## 2017-08-13 DIAGNOSIS — M47817 Spondylosis without myelopathy or radiculopathy, lumbosacral region: Secondary | ICD-10-CM | POA: Diagnosis not present

## 2017-08-14 DIAGNOSIS — I10 Essential (primary) hypertension: Secondary | ICD-10-CM | POA: Diagnosis not present

## 2017-08-14 DIAGNOSIS — M4316 Spondylolisthesis, lumbar region: Secondary | ICD-10-CM | POA: Diagnosis not present

## 2017-09-10 DIAGNOSIS — Z79899 Other long term (current) drug therapy: Secondary | ICD-10-CM | POA: Diagnosis not present

## 2017-09-10 DIAGNOSIS — M47817 Spondylosis without myelopathy or radiculopathy, lumbosacral region: Secondary | ICD-10-CM | POA: Diagnosis not present

## 2017-09-10 DIAGNOSIS — G894 Chronic pain syndrome: Secondary | ICD-10-CM | POA: Diagnosis not present

## 2017-09-10 DIAGNOSIS — M069 Rheumatoid arthritis, unspecified: Secondary | ICD-10-CM | POA: Diagnosis not present

## 2017-09-10 DIAGNOSIS — M25519 Pain in unspecified shoulder: Secondary | ICD-10-CM | POA: Diagnosis not present

## 2017-09-10 DIAGNOSIS — Z79891 Long term (current) use of opiate analgesic: Secondary | ICD-10-CM | POA: Diagnosis not present

## 2017-09-19 DIAGNOSIS — H01024 Squamous blepharitis left upper eyelid: Secondary | ICD-10-CM | POA: Diagnosis not present

## 2017-09-19 DIAGNOSIS — H01025 Squamous blepharitis left lower eyelid: Secondary | ICD-10-CM | POA: Diagnosis not present

## 2017-09-19 DIAGNOSIS — H04123 Dry eye syndrome of bilateral lacrimal glands: Secondary | ICD-10-CM | POA: Diagnosis not present

## 2017-09-19 DIAGNOSIS — H01021 Squamous blepharitis right upper eyelid: Secondary | ICD-10-CM | POA: Diagnosis not present

## 2017-09-19 DIAGNOSIS — H01022 Squamous blepharitis right lower eyelid: Secondary | ICD-10-CM | POA: Diagnosis not present

## 2017-10-08 DIAGNOSIS — M47817 Spondylosis without myelopathy or radiculopathy, lumbosacral region: Secondary | ICD-10-CM | POA: Diagnosis not present

## 2017-10-08 DIAGNOSIS — Z79891 Long term (current) use of opiate analgesic: Secondary | ICD-10-CM | POA: Diagnosis not present

## 2017-10-08 DIAGNOSIS — M5136 Other intervertebral disc degeneration, lumbar region: Secondary | ICD-10-CM | POA: Diagnosis not present

## 2017-10-08 DIAGNOSIS — M069 Rheumatoid arthritis, unspecified: Secondary | ICD-10-CM | POA: Diagnosis not present

## 2017-10-08 DIAGNOSIS — Z79899 Other long term (current) drug therapy: Secondary | ICD-10-CM | POA: Diagnosis not present

## 2017-10-08 DIAGNOSIS — G894 Chronic pain syndrome: Secondary | ICD-10-CM | POA: Diagnosis not present

## 2017-10-08 DIAGNOSIS — M25559 Pain in unspecified hip: Secondary | ICD-10-CM | POA: Diagnosis not present

## 2017-10-08 DIAGNOSIS — M5137 Other intervertebral disc degeneration, lumbosacral region: Secondary | ICD-10-CM | POA: Diagnosis not present

## 2017-10-29 DIAGNOSIS — M4310 Spondylolisthesis, site unspecified: Secondary | ICD-10-CM | POA: Diagnosis not present

## 2017-10-29 DIAGNOSIS — M545 Low back pain: Secondary | ICD-10-CM | POA: Diagnosis not present

## 2017-10-29 DIAGNOSIS — M5136 Other intervertebral disc degeneration, lumbar region: Secondary | ICD-10-CM | POA: Diagnosis not present

## 2017-11-09 DIAGNOSIS — G894 Chronic pain syndrome: Secondary | ICD-10-CM | POA: Diagnosis not present

## 2017-11-09 DIAGNOSIS — M069 Rheumatoid arthritis, unspecified: Secondary | ICD-10-CM | POA: Diagnosis not present

## 2017-11-09 DIAGNOSIS — M47817 Spondylosis without myelopathy or radiculopathy, lumbosacral region: Secondary | ICD-10-CM | POA: Diagnosis not present

## 2017-11-23 DIAGNOSIS — H16002 Unspecified corneal ulcer, left eye: Secondary | ICD-10-CM | POA: Diagnosis not present

## 2017-11-23 DIAGNOSIS — G894 Chronic pain syndrome: Secondary | ICD-10-CM | POA: Diagnosis not present

## 2017-11-23 DIAGNOSIS — M150 Primary generalized (osteo)arthritis: Secondary | ICD-10-CM | POA: Diagnosis not present

## 2017-11-23 DIAGNOSIS — M0579 Rheumatoid arthritis with rheumatoid factor of multiple sites without organ or systems involvement: Secondary | ICD-10-CM | POA: Diagnosis not present

## 2017-11-26 ENCOUNTER — Other Ambulatory Visit: Payer: Self-pay | Admitting: Pain Medicine

## 2017-11-26 ENCOUNTER — Ambulatory Visit
Admission: RE | Admit: 2017-11-26 | Discharge: 2017-11-26 | Disposition: A | Discharge status: Home / Self Care | Payer: Medicare Other | Source: Ambulatory Visit | Attending: Pain Medicine | Admitting: Pain Medicine

## 2017-11-26 DIAGNOSIS — M545 Low back pain: Secondary | ICD-10-CM

## 2017-11-26 DIAGNOSIS — M5136 Other intervertebral disc degeneration, lumbar region: Secondary | ICD-10-CM | POA: Diagnosis not present

## 2017-11-26 DIAGNOSIS — M79605 Pain in left leg: Secondary | ICD-10-CM | POA: Diagnosis not present

## 2017-11-26 DIAGNOSIS — M47816 Spondylosis without myelopathy or radiculopathy, lumbar region: Secondary | ICD-10-CM

## 2017-11-26 DIAGNOSIS — M47817 Spondylosis without myelopathy or radiculopathy, lumbosacral region: Secondary | ICD-10-CM | POA: Diagnosis not present

## 2017-12-03 DIAGNOSIS — M5136 Other intervertebral disc degeneration, lumbar region: Secondary | ICD-10-CM | POA: Diagnosis not present

## 2017-12-06 ENCOUNTER — Other Ambulatory Visit: Payer: Self-pay | Admitting: Internal Medicine

## 2017-12-06 DIAGNOSIS — I10 Essential (primary) hypertension: Secondary | ICD-10-CM

## 2017-12-07 DIAGNOSIS — M069 Rheumatoid arthritis, unspecified: Secondary | ICD-10-CM | POA: Diagnosis not present

## 2017-12-07 DIAGNOSIS — G894 Chronic pain syndrome: Secondary | ICD-10-CM | POA: Diagnosis not present

## 2017-12-07 DIAGNOSIS — M79606 Pain in leg, unspecified: Secondary | ICD-10-CM | POA: Diagnosis not present

## 2017-12-07 DIAGNOSIS — Z79899 Other long term (current) drug therapy: Secondary | ICD-10-CM | POA: Diagnosis not present

## 2017-12-07 DIAGNOSIS — Z79891 Long term (current) use of opiate analgesic: Secondary | ICD-10-CM | POA: Diagnosis not present

## 2017-12-07 DIAGNOSIS — M47817 Spondylosis without myelopathy or radiculopathy, lumbosacral region: Secondary | ICD-10-CM | POA: Diagnosis not present

## 2017-12-21 ENCOUNTER — Other Ambulatory Visit: Payer: Self-pay

## 2017-12-21 ENCOUNTER — Encounter: Payer: Self-pay | Admitting: Internal Medicine

## 2017-12-21 ENCOUNTER — Ambulatory Visit (INDEPENDENT_AMBULATORY_CARE_PROVIDER_SITE_OTHER): Payer: Medicare Other | Admitting: Internal Medicine

## 2017-12-21 ENCOUNTER — Encounter (HOSPITAL_COMMUNITY)
Admission: RE | Admit: 2017-12-21 | Discharge: 2017-12-21 | Disposition: A | Discharge status: Home / Self Care | Payer: Medicare Other | Source: Ambulatory Visit | Attending: Orthopedic Surgery | Admitting: Orthopedic Surgery

## 2017-12-21 ENCOUNTER — Encounter (HOSPITAL_COMMUNITY): Payer: Self-pay

## 2017-12-21 DIAGNOSIS — Z720 Tobacco use: Secondary | ICD-10-CM | POA: Diagnosis not present

## 2017-12-21 DIAGNOSIS — Z7982 Long term (current) use of aspirin: Secondary | ICD-10-CM | POA: Diagnosis not present

## 2017-12-21 DIAGNOSIS — R202 Paresthesia of skin: Secondary | ICD-10-CM | POA: Diagnosis not present

## 2017-12-21 DIAGNOSIS — Z736 Limitation of activities due to disability: Secondary | ICD-10-CM | POA: Diagnosis not present

## 2017-12-21 DIAGNOSIS — Z9861 Coronary angioplasty status: Secondary | ICD-10-CM

## 2017-12-21 DIAGNOSIS — M069 Rheumatoid arthritis, unspecified: Secondary | ICD-10-CM

## 2017-12-21 DIAGNOSIS — Z01818 Encounter for other preprocedural examination: Secondary | ICD-10-CM | POA: Insufficient documentation

## 2017-12-21 DIAGNOSIS — M4317 Spondylolisthesis, lumbosacral region: Secondary | ICD-10-CM | POA: Insufficient documentation

## 2017-12-21 DIAGNOSIS — M5137 Other intervertebral disc degeneration, lumbosacral region: Secondary | ICD-10-CM | POA: Insufficient documentation

## 2017-12-21 DIAGNOSIS — I2510 Atherosclerotic heart disease of native coronary artery without angina pectoris: Secondary | ICD-10-CM

## 2017-12-21 DIAGNOSIS — I10 Essential (primary) hypertension: Secondary | ICD-10-CM

## 2017-12-21 HISTORY — DX: Other intervertebral disc degeneration, lumbosacral region without mention of lumbar back pain or lower extremity pain: M51.379

## 2017-12-21 HISTORY — DX: Other intervertebral disc degeneration, lumbosacral region: M51.37

## 2017-12-21 LAB — BASIC METABOLIC PANEL
ANION GAP: 10 (ref 5–15)
BUN: 10 mg/dL (ref 6–20)
CHLORIDE: 104 mmol/L (ref 98–111)
CO2: 26 mmol/L (ref 22–32)
Calcium: 9.6 mg/dL (ref 8.9–10.3)
Creatinine, Ser: 1.11 mg/dL (ref 0.61–1.24)
GFR calc Af Amer: >60 mL/min (ref >60)
GFR calc non Af Amer: >60 mL/min (ref >60)
GLUCOSE: 129 mg/dL — AB (ref 70–99)
POTASSIUM: 3.2 mmol/L — AB (ref 3.5–5.1)
Sodium: 140 mmol/L (ref 135–145)

## 2017-12-21 LAB — CBC
HEMATOCRIT: 45.6 % (ref 39.0–52.0)
HEMOGLOBIN: 14.3 g/dL (ref 13.0–17.0)
MCH: 27 pg (ref 26.0–34.0)
MCHC: 31.4 g/dL (ref 30.0–36.0)
MCV: 86.2 fL (ref 78.0–100.0)
Platelets: 225 10*3/uL (ref 150–400)
RBC: 5.29 MIL/uL (ref 4.22–5.81)
RDW: 15.9 % — AB (ref 11.5–15.5)
WBC: 9 10*3/uL (ref 4.0–10.5)

## 2017-12-21 LAB — SURGICAL PCR SCREEN
MRSA, PCR: NEGATIVE
Staphylococcus aureus: POSITIVE — AB

## 2017-12-21 LAB — TYPE AND SCREEN
ABO/RH(D): O POS
ANTIBODY SCREEN: NEGATIVE

## 2017-12-21 MED ORDER — POTASSIUM CHLORIDE ER 20 MEQ PO TBCR: 20.0000 meq | EXTENDED_RELEASE_TABLET | Freq: Two times a day (BID) | ORAL | 0 refills | Status: DC

## 2017-12-21 NOTE — Progress Notes (Signed)
Pt denies any acute cardiopulmonary issues. Pt under the care of Dr. Ottie Glazier, Cardiology. Pt stated that he was instructed to stop taking Aspirin " a week before surgery."  Pt denies having a stress test. Pt denies recent labs. Pt was made aware that pulse must be repeated after assessment was completed; pt left PAT w/o having pulse taken. Anesthesia made aware of order for " Consult."

## 2017-12-21 NOTE — Progress Notes (Signed)
   12/21/17 1120  OBSTRUCTIVE SLEEP APNEA  Have you ever been diagnosed with sleep apnea through a sleep study? No  Do you snore loudly (loud enough to be heard through closed doors)?  1  Do you often feel tired, fatigued, or sleepy during the daytime (such as falling asleep during driving or talking to someone)? 0  Has anyone observed you stop breathing during your sleep? 1  Do you have, or are you being treated for high blood pressure? 1  BMI more than 35 kg/m2? 0  Age > 50 (1-yes) 1  Neck circumference greater than:Male 16 inches or larger, Male 17inches or larger? 1  Male Gender (Yes=1) 1  Obstructive Sleep Apnea Score 6

## 2017-12-21 NOTE — Patient Instructions (Addendum)
It was a pleasure to see you [name] [name].  Based on my exam it is okay for you to proceed with your surgery with low risk for cardiac complication.  If your surgeon feels it necessary to hold your aspirin, please discontinue 7 days prior to your surgery.  Take the potassium supplement for 2 doses.  Please follow up with your PCP in 6 months or see [place] sooner if needed.

## 2017-12-21 NOTE — Assessment & Plan Note (Addendum)
Patient has a history of mild to moderate nonobstructive CAD seen on left heart cath on 03/15/2017.  He is on medical management with aspirin 81 mg daily.  He is being seen for preoperative evaluation for planned transforaminal lumbar interbody fusion at L5-S1 on 01/03/2018.  He has no personal history of ischemic heart disease, CHF, CVA, or insulin use.  Lab work this morning shows a creatinine of 1.11.  Patient states that he can usually walk up to half a mile day with 2 or 3 stops needed due to his low back pain and leg weakness.  His back pain limits his activity and makes it difficult for him to go up a flight of stairs.  He denies any chest pain at rest or with exertion, dyspnea, palpitations, lightheadedness, dizziness, or loss of consciousness.  He does admit to tobacco use down to 2 to 3 cigarettes/day from 2 packs/day which he decreased over the last 2 or 3 weeks.  He plans to completely quit smoking soon.  A/P: Patient with nonobstructive CAD, hypertension, rheumatoid arthritis, and degenerative disc disease here for preoperative evaluation.  His revised cardiac risk index is 3.9% for 30-day risk of death, MI, or cardiac arrest.  This is considered a low risk and medically may proceed with his planned surgery.  He is on a low-dose aspirin 81 mg daily which may be held for 7 days prior to his procedure if needed to reduce risk of bleed.  Preoperative form is filled out today and faxed to his orthopedic surgeon's office at [place].

## 2017-12-21 NOTE — Progress Notes (Signed)
CC: Preoperative evaluation  HPI:  [name] [name] is a 57 y.o. male with PMH as listed below including nonobstructive CAD, HTN, RA, and DDD at L5-S1 who presents for preoperative evaluation for planned transforaminal lumbar interbody fusion at L5-S1.  Please see problem based charting for status of patient's chronic medical issues.  Pre-operative examination Patient has a history of mild to moderate nonobstructive CAD seen on left heart cath on 03/15/2017.  He is on medical management with aspirin 81 mg daily.  He is being seen for preoperative evaluation for planned transforaminal lumbar interbody fusion at L5-S1 on 01/03/2018.  He has no personal history of ischemic heart disease, CHF, CVA, or insulin use.  Lab work this morning shows a creatinine of 1.11.  Patient states that he can usually walk up to half a mile day with 2 or 3 stops needed due to his low back pain and leg weakness.  His back pain limits his activity and makes it difficult for him to go up a flight of stairs.  He denies any chest pain at rest or with exertion, dyspnea, palpitations, lightheadedness, dizziness, or loss of consciousness.  He does admit to tobacco use down to 2 to 3 cigarettes/day from 2 packs/day which he decreased over the last 2 or 3 weeks.  He plans to completely quit smoking soon.  A/P: Patient with nonobstructive CAD, hypertension, rheumatoid arthritis, and degenerative disc disease here for preoperative evaluation.  His revised cardiac risk index is 3.9% for 30-day risk of death, MI, or cardiac arrest.  This is considered a low risk and medically may proceed with his planned surgery.  He is on a low-dose aspirin 81 mg daily which may be held for 7 days prior to his procedure if needed to reduce risk of bleed.  Preoperative form is filled out today and faxed to his orthopedic surgeon's office at [place].    Past Medical History:  Diagnosis Date  . Anemia   . DDD (degenerative disc  disease), lumbosacral     and grade 2 slip  . GERD (gastroesophageal reflux disease)   . Heart murmur   . History of anemia    no current med.  . Hypertension    states is borderline on medication; has been on med. x 5-6 yr.  . Impingement syndrome of shoulder region 10/2014   left  . Lupus ([place])   . Pneumonia   . RA (rheumatoid arthritis) ([place])    Review of Systems:   Review of Systems  Respiratory: Negative for shortness of breath.   Cardiovascular: Negative for chest pain, palpitations and leg swelling.  Genitourinary:       No bowel/bladder dysfunction  Musculoskeletal:       Low back pain  Neurological: Negative for dizziness and loss of consciousness.       Tingling from the back to both buttocks     Physical Exam:  Vitals:   12/21/17 1316  BP: 116/86  Pulse: (!) 102  Temp: 98.6 F (37 C)  TempSrc: Oral  SpO2: 96%  Weight: 189 lb 6.4 oz (85.9 kg)  Height: 5\' 6"  (1.676 m)   Physical Exam  Constitutional: He is oriented to person, place, and time. He appears well-developed and well-nourished. No distress.  Cardiovascular: Normal rate and regular rhythm.  No murmur heard. Pulmonary/Chest: Effort normal. No respiratory distress. He has no wheezes. He has no rales.  Musculoskeletal: He exhibits no edema.       Cervical back: He  exhibits no tenderness.       Thoracic back: He exhibits no tenderness.       Lumbar back: He exhibits no tenderness.  Neurological: He is alert and oriented to person, place, and time.  Skin: He is not diaphoretic.     Assessment & Plan:   See Encounters Tab for problem based charting.  Patient discussed with Dr. Lynnae January

## 2017-12-21 NOTE — Pre-Procedure Instructions (Signed)
   DRAIDEN MIRSKY  12/21/2017     [id number] - [place], [place] - [address] [place] [address] Phone: [phone] Fax: [phone]   Your procedure is scheduled on Thursday, January 03, 2018  Report to [facility] Admitting at 5:30 A.M.  Call this number if you have problems the morning of surgery:  [phone]   Remember: Follow your doctors instructions regarding Aspirin; if you were not provided with instructions, call surgeon's call office to make them aware.  Do not eat food or drink liquids after midnight Wednesday, January 02, 2018  Take these medicines the morning of surgery with A SIP OF WATER : amLODipine (NORVASC),  predniSONE (DELTASONE), if needed: oxyCODONE-acetaminophen (PERCOCET) for pain Stop taking vitamins, fish oil and herbal medications. Do not take any NSAIDs ie: Ibuprofen, Advil, Naproxen (Aleve), Motrin, BC and Goody Powder; stop Thursday, December 25, 2017    Do not wear jewelry, make-up or nail polish.  Do not wear lotions, powders, or perfumes, or deodorant.  Do not shave 48 hours prior to surgery.  Men may shave face and neck.  Do not bring valuables to the hospital.  [facility] is not responsible for any belongings or valuables.  Contacts, dentures or bridgework may not be worn into surgery.  Leave your suitcase in the car.  After surgery it may be brought to your room. Patients discharged the day of surgery will not be allowed to drive home.  Special instructions:Shower the night before and the morning of surgery with [facility]. Please read over the following fact sheets that you were given. Pain Booklet, Coughing and Deep Breathing, MRSA Information and Surgical Site Infection Prevention

## 2017-12-21 NOTE — Progress Notes (Signed)
Internal Medicine Clinic Attending  Case discussed with Dr. Patel at the time of the visit.  We reviewed the resident's history and exam and pertinent patient test results.  I agree with the assessment, diagnosis, and plan of care documented in the resident's note.  

## 2017-12-24 DIAGNOSIS — M0579 Rheumatoid arthritis with rheumatoid factor of multiple sites without organ or systems involvement: Secondary | ICD-10-CM | POA: Diagnosis not present

## 2017-12-24 NOTE — Progress Notes (Signed)
Anesthesia Chart Review:  Case:  [id number] Date/Time:  01/03/18 0715   Procedure:  TRANSFORAMINAL LUMBAR INTERBODY FUSION (TLIF) L5-S1 (N/A ) - 4 hrs   Anesthesia type:  General   Pre-op diagnosis:  Isthmic grade 2 slipm with degenerative disc disease L5-S1   Location:  [facility] ROOM 04 / [facility]   Surgeon:  Melina Schools, MD      DISCUSSION: 57 y.o. male smoker scheduled for above procedure. PMH as listed below including nonobstructive CAD, HTN, RA.   Pt cleared by IM Dr. Zada Finders on 12/21/2017 - Per his note pt's revised cardiac risk index is 3.9% for 30-day risk of death, MI, or cardiac arrest. This is considered a low risk and medically may proceed with his planned surgery.    VS: BP 119/83   Pulse (!) 102 Comment: notified Esther RN  Temp 36.8 C   Resp 20   Ht 5\' 6"  (1.676 m)   Wt 188 lb 3.2 oz (85.4 kg)   SpO2 98%   BMI 30.38 kg/m   PROVIDERS: Zada Finders, MD is PCP last seen 12/21/2017 Ottie Glazier MD is Cardiologist last seen 03/15/2017   LABS: Labs reviewed: Acceptable for surgery. (all labs ordered are listed, but only abnormal results are displayed)  Labs Reviewed  SURGICAL PCR SCREEN - Abnormal; Notable for the following components:      Result Value   Staphylococcus aureus POSITIVE (*)    All other components within normal limits  BASIC METABOLIC PANEL - Abnormal; Notable for the following components:   Potassium 3.2 (*)    Glucose, Bld 129 (*)    All other components within normal limits  CBC - Abnormal; Notable for the following components:   RDW 15.9 (*)    All other components within normal limits  TYPE AND SCREEN     IMAGES: 01/02/2017 EXAM: CHEST  2 VIEW  COMPARISON:  Chest x-ray dated 01/26/2016.  FINDINGS: Stable cardiomegaly.  Atherosclerosis at the aortic arch.  Probable mild scarring/fibrosis at the lung bases. Lungs otherwise clear. No pleural effusion or pneumothorax seen. Osseous structures about the chest are  unremarkable.  IMPRESSION: No active cardiopulmonary disease. No evidence of pneumonia or pulmonary edema. Aortic atherosclerosis.   EKG: 03/13/17: Sinus rhythm with occ PVCs. Cannot rule out inferior infarct, age undetermined. Cannot rule out anterior infarct, age undetermined. New negative T waves in lateral leads - Left heart cath subsequently performed, results below.  CV: Left heart Cath 03/15/2017: 1. Mild to moderate, non-obstructive CAD involving codominant LCx and RCA. 2. Normal left ventricular contraction and filling pressure.  Recommendations: Medical therapy and risk factor modification to prevent progression of disease.  Past Medical History:  Diagnosis Date  . Anemia   . DDD (degenerative disc disease), lumbosacral     and grade 2 slip  . GERD (gastroesophageal reflux disease)   . Heart murmur   . History of anemia    no current med.  . Hypertension    states is borderline on medication; has been on med. x 5-6 yr.  . Impingement syndrome of shoulder region 10/2014   left  . Lupus ([place])   . Pneumonia   . RA (rheumatoid arthritis) ([place])     Past Surgical History:  Procedure Laterality Date  . ACHILLES TENDON SURGERY Bilateral   . LEFT HEART CATH AND CORONARY ANGIOGRAPHY N/A 03/15/2017   Procedure: LEFT HEART CATH AND CORONARY ANGIOGRAPHY;  Surgeon: Nelva Bush, MD;  Location: [place] [facility];  Service:  Cardiovascular;  Laterality: N/A;  . OSTEOTOMY AND ULNAR SHORTENING Right 07/22/2002  . SHOULDER ARTHROSCOPY WITH BICEPSTENOTOMY Right 03/11/2015   Procedure: SHOULDER ARTHROSCOPY WITH BICEPSTENOTOMY;  Surgeon: Ninetta Lights, MD;  Location: [place];  Service: Orthopedics;  Laterality: Right;  . SHOULDER ARTHROSCOPY WITH DISTAL CLAVICLE RESECTION Left 11/19/2014   Procedure: SHOULDER ARTHROSCOPY WITH DISTAL CLAVICLE RESECTION;  Surgeon: Kathryne Hitch, MD;  Location: [place];  Service: Orthopedics;  Laterality: Left;  .  SHOULDER ARTHROSCOPY WITH ROTATOR CUFF REPAIR AND SUBACROMIAL DECOMPRESSION Left 11/19/2014   Procedure: LEFT SHOULDER ARTHROSCOPY, DEBRIDEMENT DISTAL CLAVICLE EXCISION, ACROMIOPLASTY WITH ROTATOR CUFF REPAIR ;  Surgeon: Kathryne Hitch, MD;  Location: [place];  Service: Orthopedics;  Laterality: Left;  [name] VIDEO ASSISTED THORACOSCOPY (VATS)/DECORTICATION Right 11/21/2011   drainage of empyema    MEDICATIONS: . amLODipine (NORVASC) 10 MG tablet  . aspirin EC 81 MG EC tablet  . chlorthalidone (HYGROTON) 25 MG tablet  . HUMIRA PEN 40 MG/0.4ML PNKT  . methotrexate (RHEUMATREX) 2.5 MG tablet  . naloxone [facility]) 2 MG/2ML injection  . oxyCODONE-acetaminophen (PERCOCET) 10-325 MG tablet  . potassium chloride 20 MEQ TBCR  . predniSONE (DELTASONE) 5 MG tablet   No current facility-administered medications for this encounter.      [place], [place] [facility] Short Stay Center/Anesthesiology Phone [phone] 12/24/2017 3:09 PM

## 2017-12-28 DIAGNOSIS — M545 Low back pain: Secondary | ICD-10-CM | POA: Diagnosis not present

## 2017-12-28 NOTE — H&P (Addendum)
Patient ID: [name] [name] MRN: [id number] DOB/AGE: 58-Dec-1962 58 y.o.  Admit date: (Not on file)  Admission Diagnoses:  Degenerative disc disease  HPI: The patient is here for history and physical.  The patient is scheduled for TLIF @ L5-S1 on 01/03/18 at [facility] by Dr. Rolena Infante.  Pt has RA.  Pt has a congenital heart murmur.  Pt reports smoking a couple cigarettes a day.  Past Medical History: Past Medical History:  Diagnosis Date  . Anemia   . DDD (degenerative disc disease), lumbosacral     and grade 2 slip  . GERD (gastroesophageal reflux disease)   . Heart murmur   . History of anemia    no current med.  . Hypertension    states is borderline on medication; has been on med. x 5-6 yr.  . Impingement syndrome of shoulder region 10/2014   left  . Lupus ([place])   . Pneumonia   . RA (rheumatoid arthritis) ([place])     Surgical History: Past Surgical History:  Procedure Laterality Date  . ACHILLES TENDON SURGERY Bilateral   . LEFT HEART CATH AND CORONARY ANGIOGRAPHY N/A 03/15/2017   Procedure: LEFT HEART CATH AND CORONARY ANGIOGRAPHY;  Surgeon: Nelva Bush, MD;  Location: [place] [facility];  Service: Cardiovascular;  Laterality: N/A;  . OSTEOTOMY AND ULNAR SHORTENING Right 07/22/2002  . SHOULDER ARTHROSCOPY WITH BICEPSTENOTOMY Right 03/11/2015   Procedure: SHOULDER ARTHROSCOPY WITH BICEPSTENOTOMY;  Surgeon: Ninetta Lights, MD;  Location: [place];  Service: Orthopedics;  Laterality: Right;  . SHOULDER ARTHROSCOPY WITH DISTAL CLAVICLE RESECTION Left 11/19/2014   Procedure: SHOULDER ARTHROSCOPY WITH DISTAL CLAVICLE RESECTION;  Surgeon: Kathryne Hitch, MD;  Location: [place];  Service: Orthopedics;  Laterality: Left;  . SHOULDER ARTHROSCOPY WITH ROTATOR CUFF REPAIR AND SUBACROMIAL DECOMPRESSION Left 11/19/2014   Procedure: LEFT SHOULDER ARTHROSCOPY, DEBRIDEMENT DISTAL CLAVICLE EXCISION, ACROMIOPLASTY WITH ROTATOR CUFF REPAIR ;   Surgeon: Kathryne Hitch, MD;  Location: [place];  Service: Orthopedics;  Laterality: Left;  [name] VIDEO ASSISTED THORACOSCOPY (VATS)/DECORTICATION Right 11/21/2011   drainage of empyema    Family History: Family History  Problem Relation Age of Onset  . Heart attack Father 63  . Stroke Neg Hx   . Cancer Neg Hx     Social History: Social History   Socioeconomic History  . Marital status: Single    Spouse name: Not on file  . Number of children: Not on file  . Years of education: Not on file  . Highest education level: Not on file  Occupational History  . Not on file  Social Needs  . Financial resource strain: Not on file  . Food insecurity:    Worry: Not on file    Inability: Not on file  . Transportation needs:    Medical: Not on file    Non-medical: Not on file  Tobacco Use  . Smoking status: Current Every Day Smoker    Packs/day: 0.00    Years: 20.00    Pack years: 0.00    Types: Cigarettes  . Smokeless tobacco: Never Used  . Tobacco comment: 2-3 per day  Substance and Sexual Activity  . Alcohol use: No    Alcohol/week: 0.0 oz    Comment: sober 1998  . Drug use: No  . Sexual activity: Not on file  Lifestyle  . Physical activity:    Days per week: Not on file    Minutes per session: Not on file  .  Stress: Not on file  Relationships  . Social connections:    Talks on phone: Not on file    Gets together: Not on file    Attends religious service: Not on file    Active member of club or organization: Not on file    Attends meetings of clubs or organizations: Not on file    Relationship status: Not on file  . Intimate partner violence:    Fear of current or ex partner: Not on file    Emotionally abused: Not on file    Physically abused: Not on file    Forced sexual activity: Not on file  Other Topics Concern  . Not on file  Social History Narrative   Patient lives by himself, is on disability since 1992, used to work with city of [place].  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 Smokes 1/2 PPD and has been smoking since 25 years. Quit drinking in 1990.    Allergies: Morphine and related; Ramipril; and Tramadol  Medications: I have reviewed the patient's current medications.  Vital Signs: No data found.  Radiology: No results found.  Labs: No results for input(s): WBC, RBC, HCT, PLT in the last 72 hours. No results for input(s): NA, K, CL, CO2, BUN, CREATININE, GLUCOSE, CALCIUM in the last 72 hours. No results for input(s): LABPT, INR in the last 72 hours.  Review of Systems: ROS  Physical Exam: There is no height or weight on file to calculate BMI.  Physical Exam  Constitutional: He is oriented to person, place, and time. He appears well-developed and well-nourished.  HENT:  Head: Normocephalic.  Neck: Normal range of motion. Neck supple.  Cardiovascular: Normal rate and regular rhythm.  Respiratory: Effort normal and breath sounds normal.  GI: Soft. Bowel sounds are normal.  Neurological: He is alert and oriented to person, place, and time.  Skin: Skin is warm and dry.  Psychiatric: He has a normal mood and affect. His behavior is normal. Judgment and thought content normal.    He is grossly neurologically intact with no focal motor deficits or sensory deficits in the lower extremity.  Significant back pain and loss in range of motion due to pain. Flexion/extension and twisting elicits pain.     AssessCT scan demonstrates annular tears L2-3 through L5-S1 L3-4 far lateral foraminal protrusion to the right associated with severe right-sided disc space narrowing large buttressing osteophyte most likely causing impingement to the exiting L3 traversing L4 nerve root L4-5 left extrusion with free fragment in the canal compressing the left L4 and L5 nerve roots L5-S1 5 mm of anterior listhesis posterior extravasation of fluid bilateral pars defects noted no stenosis noted but positive bilateral foraminal narrowingment and    Plan: Risks and benefits of surgery were discussed with the patient. These include: Infection, bleeding, death, stroke, paralysis, ongoing or worse pain, need for additional surgery, nonunion, leak of spinal fluid, adjacent segment degeneration requiring additional fusion surgery, Injury to abdominal vessels that can require anterior surgery to stop bleeding. Malposition of the cage and/or pedicle screws that could require additional surgery. Loss of bowel and bladder control. Postoperative hematoma causing neurologic compression that could require urgent or emergent re-operation.  Goals of surgery: Reduced (not eliminated) pain, and improved quality of life.  Ronette Deter, PAC for Melina Schools, MD [facility] [phone]  Patient presents for L5-S1 spinal fusion for spondyololisthesis with neurogenic claudication affecting the left lower extremity.  I have explained the procedure to the patient and his wife and  all their questions were addressed.  We have again reviewed the risks and benefits and alternatives to surgery.  There is no change in his clinical exam from his evaluation on 12/28/2017.

## 2018-01-02 NOTE — Anesthesia Preprocedure Evaluation (Addendum)
Anesthesia Evaluation  Patient identified by MRN, date of birth, ID band Patient awake    Reviewed: Allergy & Precautions, NPO status , Patient's Chart, lab work & pertinent test results  Airway Mallampati: III  TM Distance: >3 FB Neck ROM: Full    Dental  (+) Edentulous Upper, Edentulous Lower, Dental Advisory Given   Pulmonary Current Smoker,    Pulmonary exam normal breath sounds clear to auscultation       Cardiovascular hypertension, Pt. on medications + CAD  Normal cardiovascular exam Rhythm:Regular Rate:Normal  ECG: SR, rate 90  CATH: 1. Mild to moderate, non-obstructive CAD involving codominant LCx and RCA. 2. Normal left ventricular contraction and filling pressure. Recommendations: 1. Medical therapy and risk factor modification to prevent progression of disease. Nelva Bush, MD  Sees cardiologist   Neuro/Psych negative neurological ROS  negative psych ROS   GI/Hepatic negative GI ROS, Neg liver ROS,   Endo/Other  Lupus   Renal/GU negative Renal ROS     Musculoskeletal  (+) Arthritis , Rheumatoid disorders,    Abdominal (+) + obese,   Peds  Hematology negative hematology ROS (+)   Anesthesia Other Findings Isthmic grade 2 slipm with degenerative disc disease L5-S1  Reproductive/Obstetrics                           Anesthesia Physical Anesthesia Plan  ASA: III  Anesthesia Plan: General   Post-op Pain Management:    Induction: Intravenous  PONV Risk Score and Plan: 1 and Midazolam, Ondansetron, Dexamethasone and Treatment may vary due to age or medical condition  Airway Management Planned: Oral ETT  Additional Equipment:   Intra-op Plan:   Post-operative Plan: Extubation in OR  Informed Consent: I have reviewed the patients History and Physical, chart, labs and discussed the procedure including the risks, benefits and alternatives for the proposed anesthesia  with the patient or authorized representative who has indicated his/her understanding and acceptance.   Dental advisory given  Plan Discussed with: CRNA  Anesthesia Plan Comments:         Anesthesia Quick Evaluation

## 2018-01-03 ENCOUNTER — Inpatient Hospital Stay (HOSPITAL_COMMUNITY): Payer: Medicare Other | Admitting: Emergency Medicine

## 2018-01-03 ENCOUNTER — Encounter (HOSPITAL_COMMUNITY): Payer: Self-pay | Admitting: *Deleted

## 2018-01-03 ENCOUNTER — Inpatient Hospital Stay (HOSPITAL_COMMUNITY): Payer: Medicare Other

## 2018-01-03 ENCOUNTER — Encounter (HOSPITAL_COMMUNITY)
Admission: RE | Disposition: A | Discharge status: Home / Self Care | Payer: Self-pay | Source: Ambulatory Visit | Attending: Orthopedic Surgery

## 2018-01-03 ENCOUNTER — Inpatient Hospital Stay (HOSPITAL_COMMUNITY): Payer: Medicare Other | Admitting: Anesthesiology

## 2018-01-03 ENCOUNTER — Inpatient Hospital Stay (HOSPITAL_COMMUNITY)
Admission: RE | Admit: 2018-01-03 | Discharge: 2018-01-04 | DRG: 460 | Disposition: A | Discharge status: Home / Self Care | Payer: Medicare Other | Source: Ambulatory Visit | Attending: Orthopedic Surgery | Admitting: Orthopedic Surgery

## 2018-01-03 DIAGNOSIS — M329 Systemic lupus erythematosus, unspecified: Secondary | ICD-10-CM | POA: Diagnosis not present

## 2018-01-03 DIAGNOSIS — M4807 Spinal stenosis, lumbosacral region: Secondary | ICD-10-CM | POA: Diagnosis not present

## 2018-01-03 DIAGNOSIS — Z981 Arthrodesis status: Secondary | ICD-10-CM

## 2018-01-03 DIAGNOSIS — R011 Cardiac murmur, unspecified: Secondary | ICD-10-CM | POA: Diagnosis present

## 2018-01-03 DIAGNOSIS — Z885 Allergy status to narcotic agent status: Secondary | ICD-10-CM

## 2018-01-03 DIAGNOSIS — F17210 Nicotine dependence, cigarettes, uncomplicated: Secondary | ICD-10-CM | POA: Diagnosis present

## 2018-01-03 DIAGNOSIS — E669 Obesity, unspecified: Secondary | ICD-10-CM | POA: Diagnosis present

## 2018-01-03 DIAGNOSIS — M069 Rheumatoid arthritis, unspecified: Secondary | ICD-10-CM | POA: Diagnosis present

## 2018-01-03 DIAGNOSIS — M48061 Spinal stenosis, lumbar region without neurogenic claudication: Secondary | ICD-10-CM | POA: Diagnosis not present

## 2018-01-03 DIAGNOSIS — M5137 Other intervertebral disc degeneration, lumbosacral region: Secondary | ICD-10-CM | POA: Diagnosis present

## 2018-01-03 DIAGNOSIS — I2510 Atherosclerotic heart disease of native coronary artery without angina pectoris: Secondary | ICD-10-CM | POA: Diagnosis not present

## 2018-01-03 DIAGNOSIS — Z419 Encounter for procedure for purposes other than remedying health state, unspecified: Secondary | ICD-10-CM

## 2018-01-03 DIAGNOSIS — Z888 Allergy status to other drugs, medicaments and biological substances status: Secondary | ICD-10-CM

## 2018-01-03 DIAGNOSIS — M4317 Spondylolisthesis, lumbosacral region: Secondary | ICD-10-CM | POA: Diagnosis not present

## 2018-01-03 DIAGNOSIS — I10 Essential (primary) hypertension: Secondary | ICD-10-CM | POA: Diagnosis present

## 2018-01-03 DIAGNOSIS — Z79899 Other long term (current) drug therapy: Secondary | ICD-10-CM

## 2018-01-03 DIAGNOSIS — M4327 Fusion of spine, lumbosacral region: Secondary | ICD-10-CM | POA: Diagnosis not present

## 2018-01-03 DIAGNOSIS — E785 Hyperlipidemia, unspecified: Secondary | ICD-10-CM | POA: Diagnosis not present

## 2018-01-03 DIAGNOSIS — M4316 Spondylolisthesis, lumbar region: Principal | ICD-10-CM | POA: Diagnosis present

## 2018-01-03 DIAGNOSIS — Z6830 Body mass index (BMI) 30.0-30.9, adult: Secondary | ICD-10-CM | POA: Diagnosis not present

## 2018-01-03 DIAGNOSIS — M5136 Other intervertebral disc degeneration, lumbar region: Secondary | ICD-10-CM | POA: Diagnosis not present

## 2018-01-03 HISTORY — PX: TRANSFORAMINAL LUMBAR INTERBODY FUSION (TLIF) WITH PEDICLE SCREW FIXATION 1 LEVEL: SHX6141

## 2018-01-03 SURGERY — TRANSFORAMINAL LUMBAR INTERBODY FUSION (TLIF) WITH PEDICLE SCREW FIXATION 1 LEVEL
Anesthesia: General | Site: Spine Lumbar

## 2018-01-03 MED ORDER — SODIUM CHLORIDE 0.9% FLUSH: 3.0000 mL | INTRAVENOUS | Status: DC | PRN

## 2018-01-03 MED ORDER — ACETAMINOPHEN 325 MG PO TABS
650.0000 mg | ORAL_TABLET | ORAL | Status: DC | PRN
  Administered 2018-01-03 – 2018-01-04 (×2): 650 mg via ORAL
  Filled 2018-01-03 (×2): qty 2

## 2018-01-03 MED ORDER — PROPOFOL 10 MG/ML IV BOLUS
INTRAVENOUS | Status: AC
  Filled 2018-01-03: qty 20

## 2018-01-03 MED ORDER — BUPIVACAINE-EPINEPHRINE 0.25% -1:200000 IJ SOLN
INTRAMUSCULAR | Status: DC | PRN
  Administered 2018-01-03: 20 mL

## 2018-01-03 MED ORDER — CHLORTHALIDONE 25 MG PO TABS
25.0000 mg | ORAL_TABLET | Freq: Every day | ORAL | Status: DC
  Administered 2018-01-04: 25 mg via ORAL
  Filled 2018-01-03: qty 1

## 2018-01-03 MED ORDER — METHOCARBAMOL 1000 MG/10ML IJ SOLN
500.0000 mg | Freq: Four times a day (QID) | INTRAVENOUS | Status: DC | PRN
  Filled 2018-01-03: qty 5

## 2018-01-03 MED ORDER — ONDANSETRON HCL 4 MG PO TABS: 4.0000 mg | ORAL_TABLET | Freq: Four times a day (QID) | ORAL | Status: DC | PRN

## 2018-01-03 MED ORDER — ALBUMIN HUMAN 5 % IV SOLN
INTRAVENOUS | Status: DC | PRN
  Administered 2018-01-03: 11:00:00 via INTRAVENOUS

## 2018-01-03 MED ORDER — MAGNESIUM CITRATE PO SOLN: 1.0000 | Freq: Once | ORAL | Status: DC | PRN

## 2018-01-03 MED ORDER — FENTANYL CITRATE (PF) 100 MCG/2ML IJ SOLN
INTRAMUSCULAR | Status: DC | PRN
  Administered 2018-01-03 (×2): 50 ug via INTRAVENOUS
  Administered 2018-01-03: 150 ug via INTRAVENOUS
  Administered 2018-01-03 (×3): 50 ug via INTRAVENOUS

## 2018-01-03 MED ORDER — OXYCODONE-ACETAMINOPHEN 10-325 MG PO TABS: 1.0000 | ORAL_TABLET | ORAL | 0 refills | Status: AC | PRN

## 2018-01-03 MED ORDER — LACTATED RINGERS IV SOLN
INTRAVENOUS | Status: DC
  Administered 2018-01-03: 14:00:00 via INTRAVENOUS

## 2018-01-03 MED ORDER — DEXAMETHASONE SODIUM PHOSPHATE 10 MG/ML IJ SOLN
INTRAMUSCULAR | Status: AC
  Filled 2018-01-03: qty 1

## 2018-01-03 MED ORDER — DEXTROSE 5 % IV SOLN
INTRAVENOUS | Status: DC | PRN
  Administered 2018-01-03: 50 ug/min via INTRAVENOUS

## 2018-01-03 MED ORDER — OXYCODONE HCL 5 MG PO TABS
10.0000 mg | ORAL_TABLET | ORAL | Status: DC | PRN
  Administered 2018-01-03 – 2018-01-04 (×7): 10 mg via ORAL
  Filled 2018-01-03 (×6): qty 2

## 2018-01-03 MED ORDER — METHOCARBAMOL 500 MG PO TABS
ORAL_TABLET | ORAL | Status: AC
  Filled 2018-01-03: qty 1

## 2018-01-03 MED ORDER — ONDANSETRON HCL 4 MG/2ML IJ SOLN
INTRAMUSCULAR | Status: AC
  Filled 2018-01-03: qty 2

## 2018-01-03 MED ORDER — LIDOCAINE HCL (CARDIAC) PF 100 MG/5ML IV SOSY
PREFILLED_SYRINGE | INTRAVENOUS | Status: DC | PRN
  Administered 2018-01-03: 60 mg via INTRAVENOUS

## 2018-01-03 MED ORDER — HEMOSTATIC AGENTS (NO CHARGE) OPTIME
TOPICAL | Status: DC | PRN
  Administered 2018-01-03: 1 via TOPICAL

## 2018-01-03 MED ORDER — HYDROMORPHONE HCL 1 MG/ML IJ SOLN
INTRAMUSCULAR | Status: AC
  Administered 2018-01-03: 0.5 mg via INTRAVENOUS
  Filled 2018-01-03: qty 1

## 2018-01-03 MED ORDER — BUPIVACAINE-EPINEPHRINE (PF) 0.25% -1:200000 IJ SOLN
INTRAMUSCULAR | Status: AC
  Filled 2018-01-03: qty 30

## 2018-01-03 MED ORDER — PHENOL 1.4 % MT LIQD: 1.0000 | OROMUCOSAL | Status: DC | PRN

## 2018-01-03 MED ORDER — METHOCARBAMOL 500 MG PO TABS
500.0000 mg | ORAL_TABLET | Freq: Four times a day (QID) | ORAL | Status: DC | PRN
  Administered 2018-01-03 – 2018-01-04 (×4): 500 mg via ORAL
  Filled 2018-01-03 (×3): qty 1

## 2018-01-03 MED ORDER — PROPOFOL 500 MG/50ML IV EMUL
INTRAVENOUS | Status: DC | PRN
  Administered 2018-01-03: 50 ug/kg/min via INTRAVENOUS

## 2018-01-03 MED ORDER — OXYCODONE HCL 5 MG PO TABS
ORAL_TABLET | ORAL | Status: AC
  Filled 2018-01-03: qty 2

## 2018-01-03 MED ORDER — ONDANSETRON 4 MG PO TBDP: 4.0000 mg | ORAL_TABLET | Freq: Three times a day (TID) | ORAL | 0 refills | Status: DC | PRN

## 2018-01-03 MED ORDER — SUCCINYLCHOLINE CHLORIDE 20 MG/ML IJ SOLN
INTRAMUSCULAR | Status: DC | PRN
  Administered 2018-01-03: 70 mg via INTRAVENOUS

## 2018-01-03 MED ORDER — THROMBIN 20000 UNITS EX SOLR
CUTANEOUS | Status: DC | PRN
  Administered 2018-01-03: 20 mL via TOPICAL

## 2018-01-03 MED ORDER — ACETAMINOPHEN 10 MG/ML IV SOLN
1000.0000 mg | Freq: Once | INTRAVENOUS | Status: AC
  Administered 2018-01-03: 1000 mg via INTRAVENOUS
  Filled 2018-01-03: qty 100

## 2018-01-03 MED ORDER — PROPOFOL 10 MG/ML IV BOLUS
INTRAVENOUS | Status: DC | PRN
  Administered 2018-01-03: 50 mg via INTRAVENOUS
  Administered 2018-01-03 (×2): 40 mg via INTRAVENOUS
  Administered 2018-01-03: 110 mg via INTRAVENOUS

## 2018-01-03 MED ORDER — EPHEDRINE SULFATE 50 MG/ML IJ SOLN
INTRAMUSCULAR | Status: AC
  Filled 2018-01-03: qty 1

## 2018-01-03 MED ORDER — SODIUM CHLORIDE 0.9 % IJ SOLN
INTRAMUSCULAR | Status: AC
  Filled 2018-01-03: qty 10

## 2018-01-03 MED ORDER — MIDAZOLAM HCL 5 MG/5ML IJ SOLN
INTRAMUSCULAR | Status: DC | PRN
  Administered 2018-01-03: 2 mg via INTRAVENOUS

## 2018-01-03 MED ORDER — AMLODIPINE BESYLATE 5 MG PO TABS
10.0000 mg | ORAL_TABLET | Freq: Every day | ORAL | Status: DC
  Administered 2018-01-04: 10 mg via ORAL
  Filled 2018-01-03: qty 2

## 2018-01-03 MED ORDER — LACTATED RINGERS IV SOLN
INTRAVENOUS | Status: DC | PRN
  Administered 2018-01-03 (×3): via INTRAVENOUS

## 2018-01-03 MED ORDER — POLYETHYLENE GLYCOL 3350 17 G PO PACK: 17.0000 g | PACK | Freq: Every day | ORAL | Status: DC | PRN

## 2018-01-03 MED ORDER — 0.9 % SODIUM CHLORIDE (POUR BTL) OPTIME
TOPICAL | Status: DC | PRN
  Administered 2018-01-03 (×2): 1000 mL

## 2018-01-03 MED ORDER — THROMBIN 20000 UNITS EX SOLR
CUTANEOUS | Status: AC
  Filled 2018-01-03: qty 20000

## 2018-01-03 MED ORDER — METHOCARBAMOL 500 MG PO TABS: 500.0000 mg | ORAL_TABLET | Freq: Three times a day (TID) | ORAL | 0 refills | Status: DC

## 2018-01-03 MED ORDER — ONDANSETRON HCL 4 MG/2ML IJ SOLN: 4.0000 mg | Freq: Four times a day (QID) | INTRAMUSCULAR | Status: DC | PRN

## 2018-01-03 MED ORDER — HYDROMORPHONE HCL 1 MG/ML IJ SOLN
0.2500 mg | INTRAMUSCULAR | Status: DC | PRN
  Administered 2018-01-03 (×4): 0.5 mg via INTRAVENOUS

## 2018-01-03 MED ORDER — SODIUM CHLORIDE 0.9% FLUSH
3.0000 mL | Freq: Two times a day (BID) | INTRAVENOUS | Status: DC
  Administered 2018-01-03 (×2): 3 mL via INTRAVENOUS

## 2018-01-03 MED ORDER — PHENYLEPHRINE HCL 10 MG/ML IJ SOLN
INTRAMUSCULAR | Status: DC | PRN
  Administered 2018-01-03 (×2): 40 ug via INTRAVENOUS

## 2018-01-03 MED ORDER — MENTHOL 3 MG MT LOZG: 1.0000 | LOZENGE | OROMUCOSAL | Status: DC | PRN

## 2018-01-03 MED ORDER — ACETAMINOPHEN 650 MG RE SUPP: 650.0000 mg | RECTAL | Status: DC | PRN

## 2018-01-03 MED ORDER — FENTANYL CITRATE (PF) 250 MCG/5ML IJ SOLN
INTRAMUSCULAR | Status: AC
  Filled 2018-01-03: qty 5

## 2018-01-03 MED ORDER — CEFAZOLIN SODIUM-DEXTROSE 2-4 GM/100ML-% IV SOLN
2.0000 g | INTRAVENOUS | Status: AC
  Administered 2018-01-03: 2 g via INTRAVENOUS
  Filled 2018-01-03: qty 100

## 2018-01-03 MED ORDER — PROMETHAZINE HCL 25 MG/ML IJ SOLN: 6.2500 mg | INTRAMUSCULAR | Status: DC | PRN

## 2018-01-03 MED ORDER — CEFAZOLIN SODIUM-DEXTROSE 2-4 GM/100ML-% IV SOLN
2.0000 g | Freq: Three times a day (TID) | INTRAVENOUS | Status: AC
  Administered 2018-01-03 (×2): 2 g via INTRAVENOUS
  Filled 2018-01-03 (×2): qty 100

## 2018-01-03 MED ORDER — PHENYLEPHRINE 40 MCG/ML (10ML) SYRINGE FOR IV PUSH (FOR BLOOD PRESSURE SUPPORT)
PREFILLED_SYRINGE | INTRAVENOUS | Status: AC
  Filled 2018-01-03: qty 10

## 2018-01-03 MED ORDER — DOCUSATE SODIUM 100 MG PO CAPS
100.0000 mg | ORAL_CAPSULE | Freq: Two times a day (BID) | ORAL | Status: DC
  Administered 2018-01-03 – 2018-01-04 (×2): 100 mg via ORAL
  Filled 2018-01-03 (×2): qty 1

## 2018-01-03 MED ORDER — ONDANSETRON HCL 4 MG/2ML IJ SOLN
INTRAMUSCULAR | Status: DC | PRN
  Administered 2018-01-03: 4 mg via INTRAVENOUS

## 2018-01-03 MED ORDER — HYDROMORPHONE HCL 1 MG/ML IJ SOLN
1.0000 mg | INTRAMUSCULAR | Status: DC | PRN
  Administered 2018-01-03: 1 mg via INTRAVENOUS
  Filled 2018-01-03: qty 1

## 2018-01-03 MED ORDER — MIDAZOLAM HCL 2 MG/2ML IJ SOLN
INTRAMUSCULAR | Status: AC
  Filled 2018-01-03: qty 2

## 2018-01-03 MED ORDER — PREDNISONE 5 MG PO TABS
5.0000 mg | ORAL_TABLET | Freq: Every day | ORAL | Status: DC
  Administered 2018-01-04: 5 mg via ORAL
  Filled 2018-01-03: qty 1

## 2018-01-03 MED ORDER — DEXAMETHASONE SODIUM PHOSPHATE 10 MG/ML IJ SOLN
INTRAMUSCULAR | Status: DC | PRN
  Administered 2018-01-03: 10 mg via INTRAVENOUS

## 2018-01-03 SURGICAL SUPPLY — 87 items
BLADE CLIPPER SURG (BLADE) ×2 IMPLANT
BUR EGG ELITE 4.0 (BURR) IMPLANT
BUR EGG ELITE 4.0MM (BURR)
CABLE BIPOLOR RESECTION CORD (MISCELLANEOUS) ×3 IMPLANT
CAGE TLIF NANOLOCK XL 9 (Cage) ×1 IMPLANT
CAGE TLIF NANOLOCK XL 9MM (Cage) ×1 IMPLANT
CANISTER SUCT 3000ML PPV (MISCELLANEOUS) ×3 IMPLANT
CLIP NEUROVISION LG (CLIP) ×2 IMPLANT
CLOSURE STERI-STRIP 1/2X4 (GAUZE/BANDAGES/DRESSINGS) ×1
CLSR STERI-STRIP ANTIMIC 1/2X4 (GAUZE/BANDAGES/DRESSINGS) ×2 IMPLANT
CONT SPEC 4OZ CLIKSEAL STRL BL (MISCELLANEOUS) ×2 IMPLANT
COVER SURGICAL LIGHT HANDLE (MISCELLANEOUS) ×1 IMPLANT
DRAPE C-ARM 42X72 X-RAY (DRAPES) ×3 IMPLANT
DRAPE C-ARMOR (DRAPES) ×3 IMPLANT
DRAPE LAPAROTOMY 100X72X124 (DRAPES) ×2 IMPLANT
DRAPE POUCH INSTRU U-SHP 10X18 (DRAPES) ×3 IMPLANT
DRAPE SURG 17X23 STRL (DRAPES) ×3 IMPLANT
DRAPE U-SHAPE 47X51 STRL (DRAPES) ×3 IMPLANT
DRSG OPSITE POSTOP 4X6 (GAUZE/BANDAGES/DRESSINGS) ×4 IMPLANT
DRSG OPSITE POSTOP 4X8 (GAUZE/BANDAGES/DRESSINGS) ×1 IMPLANT
DURAPREP 26ML APPLICATOR (WOUND CARE) ×3 IMPLANT
ELECT BLADE 4.0 EZ CLEAN MEGAD (MISCELLANEOUS) ×3
ELECT BLADE 6.5 EXT (BLADE) IMPLANT
ELECT PENCIL ROCKER SW 15FT (MISCELLANEOUS) ×1 IMPLANT
ELECT REM PT RETURN 9FT ADLT (ELECTROSURGICAL) ×3
ELECTRODE BLDE 4.0 EZ CLN MEGD (MISCELLANEOUS) ×1 IMPLANT
ELECTRODE REM PT RTRN 9FT ADLT (ELECTROSURGICAL) ×1 IMPLANT
FLOSEAL 5ML (HEMOSTASIS) ×2 IMPLANT
GLOVE BIO SURGEON STRL SZ 6.5 (GLOVE) ×2 IMPLANT
GLOVE BIO SURGEONS STRL SZ 6.5 (GLOVE) ×2
GLOVE BIOGEL PI IND STRL 6.5 (GLOVE) IMPLANT
GLOVE BIOGEL PI IND STRL 8.5 (GLOVE) ×1 IMPLANT
GLOVE BIOGEL PI INDICATOR 6.5 (GLOVE) ×4
GLOVE BIOGEL PI INDICATOR 8.5 (GLOVE) ×2
GLOVE SS BIOGEL STRL SZ 8.5 (GLOVE) ×1 IMPLANT
GLOVE SUPERSENSE BIOGEL SZ 8.5 (GLOVE) ×2
GLOVE SURG SS PI 7.0 STRL IVOR (GLOVE) ×6 IMPLANT
GLOVE SURG SS PI 7.5 STRL IVOR (GLOVE) ×8 IMPLANT
GOWN STRL REUS W/ TWL LRG LVL3 (GOWN DISPOSABLE) ×1 IMPLANT
GOWN STRL REUS W/TWL 2XL LVL3 (GOWN DISPOSABLE) ×4 IMPLANT
GOWN STRL REUS W/TWL LRG LVL3 (GOWN DISPOSABLE) ×9
KIT BASIN OR (CUSTOM PROCEDURE TRAY) ×3 IMPLANT
KIT POSITION SURG JACKSON T1 (MISCELLANEOUS) ×2 IMPLANT
KIT TURNOVER KIT B (KITS) ×3 IMPLANT
MILL MEDIUM DISP (BLADE) ×2 IMPLANT
MODULE EMG NDL SSEP NVM5 (NEEDLE) IMPLANT
MODULE EMG NEEDLE SSEP NVM5 (NEEDLE) ×3 IMPLANT
MODULE NVM5 NEXT GEN EMG (NEEDLE) ×2 IMPLANT
NDL SPNL 18GX3.5 QUINCKE PK (NEEDLE) ×2 IMPLANT
NEEDLE 22X1 1/2 (OR ONLY) (NEEDLE) ×3 IMPLANT
NEEDLE SPNL 18GX3.5 QUINCKE PK (NEEDLE) ×6 IMPLANT
NS IRRIG 1000ML POUR BTL (IV SOLUTION) ×5 IMPLANT
PACK LAMINECTOMY NEURO (CUSTOM PROCEDURE TRAY) ×2 IMPLANT
PACK LAMINECTOMY ORTHO (CUSTOM PROCEDURE TRAY) ×1 IMPLANT
PACK UNIVERSAL I (CUSTOM PROCEDURE TRAY) ×3 IMPLANT
PAD ARMBOARD 7.5X6 YLW CONV (MISCELLANEOUS) ×6 IMPLANT
PATTIES SURGICAL .5 X.5 (GAUZE/BANDAGES/DRESSINGS) ×2 IMPLANT
PATTIES SURGICAL .5 X1 (DISPOSABLE) ×3 IMPLANT
POSITIONER HEAD PRONE TRACH (MISCELLANEOUS) ×3 IMPLANT
PROBE BALL TIP NVM5 SNG USE (BALLOONS) ×2 IMPLANT
PUTTY DBX 1CC (Putty) ×3 IMPLANT
PUTTY DBX 1CC DEPUY (Putty) IMPLANT
REDUCTION EXT RELINE MAS MOD (Neuro Prosthesis/Implant) ×4 IMPLANT
ROD RELINE MAS TI 5.5X35MM LRD (Rod) ×4 IMPLANT
SCREW LOCK RELINE 5.5 TULIP (Screw) ×8 IMPLANT
SCREW RED MAS POLY 7.5X40MM (Screw) ×2 IMPLANT
SCREW RED RELINE 7.5X45MM POLY (Screw) ×2 IMPLANT
SCREW SHANK RELINE 7.5X40MM 2C (Screw) ×2 IMPLANT
SCREW SHANK RELINE MOD 7.5X45 (Screw) ×3 IMPLANT
SCREW SHANK RLINE MD 7.5X45 2C (Screw) IMPLANT
SHEET CONFORM 45LX20WX5H (Bone Implant) ×2 IMPLANT
SPONGE LAP 4X18 RFD (DISPOSABLE) ×6 IMPLANT
SPONGE SURGIFOAM ABS GEL 100 (HEMOSTASIS) ×5 IMPLANT
SURGIFLO W/THROMBIN 8M KIT (HEMOSTASIS) IMPLANT
SUT BONE WAX W31G (SUTURE) ×3 IMPLANT
SUT MNCRL AB 3-0 PS2 18 (SUTURE) ×8 IMPLANT
SUT VIC AB 1 CT1 18XCR BRD 8 (SUTURE) ×1 IMPLANT
SUT VIC AB 1 CT1 8-18 (SUTURE) ×3
SUT VIC AB 2-0 CT1 18 (SUTURE) ×5 IMPLANT
SUT VICRYL 0 UR6 27IN ABS (SUTURE) ×2 IMPLANT
SYR BULB IRRIGATION 50ML (SYRINGE) ×3 IMPLANT
SYR CONTROL 10ML LL (SYRINGE) ×3 IMPLANT
TOWEL GREEN STERILE (TOWEL DISPOSABLE) ×3 IMPLANT
TOWEL GREEN STERILE FF (TOWEL DISPOSABLE) ×3 IMPLANT
TRAY FOLEY MTR SLVR 16FR STAT (SET/KITS/TRAYS/PACK) ×3 IMPLANT
WATER STERILE IRR 1000ML POUR (IV SOLUTION) ×3 IMPLANT
YANKAUER SUCT BULB TIP NO VENT (SUCTIONS) ×3 IMPLANT

## 2018-01-03 NOTE — Brief Op Note (Signed)
01/03/2018  11:23 AM  PATIENT:  [name] [name]  57 y.o. male  PRE-OPERATIVE DIAGNOSIS:  Isthmic grade 2 slipm with degenerative disc disease L5-S1  POST-OPERATIVE DIAGNOSIS:  Isthmic grade 2 slipm with degenerative disc disease L5-S1  PROCEDURE:  Procedure(s): TRANSFORAMINAL LUMBAR INTERBODY FUSION (TLIF) LUMBAR FIVE-SACRAL ONE (N/A)  SURGEON:  Surgeon(s) and Role:    Melina Schools, MD - Primary  PHYSICIAN ASSISTANT:   ASSISTANTS: Carmen Mayo PA   ANESTHESIA:   general  EBL:  500 mL   BLOOD ADMINISTERED:none  DRAINS: none   LOCAL MEDICATIONS USED:  MARCAINE     SPECIMEN:  No Specimen  DISPOSITION OF SPECIMEN:  N/A  COUNTS:  YES  TOURNIQUET:  * No tourniquets in log *  DICTATION: .Dragon Dictation  PLAN OF CARE: Admit to inpatient   PATIENT DISPOSITION:  PACU - hemodynamically stable.

## 2018-01-03 NOTE — Discharge Instructions (Signed)
Spinal Fusion, Care After °These instructions give you information about caring for yourself after your procedure. Your doctor may also give you more specific instructions. Call your doctor if you have any problems or questions after your procedure. °Follow these instructions at home: °Medicines °· Take over-the-counter and prescription medicines only as told by your doctor. These include any medicines for pain. °· Do not drive for 24 hours if you received a sedative. °· Do not drive or use heavy machinery while taking prescription pain medicine. °· If you were prescribed an antibiotic medicine, take it as told by your doctor. Do not stop taking the antibiotic even if you start to feel better. °Surgical Cut (Incision) Care °· Follow instructions from your doctor about how to take care of your surgical cut. Make sure you: °? Wash your hands with soap and water before you change your bandage (dressing). If you cannot use soap and water, use hand sanitizer. °? Change your bandage as told by your doctor. °? Leave stitches (sutures), skin glue, or skin tape (adhesive) strips in place. They may need to stay in place for 2 weeks or longer. If tape strips get loose and curl up, you may trim the loose edges. Do not remove tape strips completely unless your doctor says it is okay. °· Keep your surgical cut clean and dry. Do not take baths, swim, or use a hot tub until your doctor says it is okay. °· Check your surgical cut and the area around it every day for: °? Redness. °? Swelling. °? Fluid. °Physical Activity °· Return to your normal activities as told by your doctor. Ask your doctor what activities are safe for you. Rest and protect your back as much as you can. °· Follow instructions from your doctor about how to move. Use good posture to help your spine heal. °· Do not lift anything that is heavier than 8 lb (3.6 kg) or as told by your doctor until he or she says that it is safe. Do not lift anything over your  head. °· Do not twist or bend at the waist until your doctor says it is okay. °· Avoid pushing or pulling motions. °· Do not sit or lie down in the same position for long periods of time. °· Do not start to exercise until your doctor says it is okay. Ask your doctor what kinds of exercise you can do to make your back stronger. °General instructions °· If you were given a brace, use it as told by your doctor. °· Wear compression stockings as told by your doctor. °· Do not use tobacco products. These include cigarettes, chewing tobacco, or e-cigarettes. If you need help quitting, ask your doctor. °· Keep all follow-up visits as told by your doctor. This is important. This includes any visits with your physical therapist, if this applies. °Contact a doctor if: °· Your pain gets worse. °· Your medicine does not help your pain. °· Your legs or feet become painful or swollen. °· Your surgical cut is red, swollen, or painful. °· You have fluid, blood, or pus coming from your surgical cut. °· You feel sick to your stomach (nauseous). °· You throw up (vomit). °· Your have weakness or loss of feeling (numbness) in your legs that is new or getting worse. °· You have a fever. °· You have trouble controlling when you pee (urinate) or poop (have a bowel movement). °Get help right away if: °· Your pain is very bad. °· You have   chest pain. °· You have trouble breathing. °· You start to have a cough. °These symptoms may be an emergency. Do not wait to see if the symptoms will go away. Get medical help right away. Call your local emergency services (911 in the [place] Do not drive yourself to the hospital. °This information is not intended to replace advice given to you by your health care provider. Make sure you discuss any questions you have with your health care provider. °Document Released: 10/06/2010 Document Revised: 02/08/2016 Document Reviewed: 11/25/2014 °Elsevier Interactive Patient Education © 2018 [employer] ° °

## 2018-01-03 NOTE — Transfer of Care (Signed)
Immediate Anesthesia Transfer of Care Note  Patient: [name] [name]  Procedure(s) Performed: TRANSFORAMINAL LUMBAR INTERBODY FUSION (TLIF) LUMBAR FIVE-SACRAL ONE (N/A Spine Lumbar)  Patient Location: PACU  Anesthesia Type:General  Level of Consciousness: oriented, drowsy and patient cooperative  Airway & Oxygen Therapy: Patient Spontanous Breathing and Patient connected to face mask oxygen  Post-op Assessment: Report given to RN and Post -op Vital signs reviewed and stable  Post vital signs: Reviewed  Last Vitals:  Vitals Value Taken Time  BP 140/84 01/03/2018 11:32 AM  Temp    Pulse 96 01/03/2018 11:35 AM  Resp 24 01/03/2018 11:35 AM  SpO2 99 % 01/03/2018 11:35 AM  Vitals shown include unvalidated device data.  Last Pain:  Vitals:   01/03/18 0559  TempSrc: Oral         Complications: No apparent anesthesia complications

## 2018-01-03 NOTE — Anesthesia Postprocedure Evaluation (Signed)
Anesthesia Post Note  Patient: [name] [name]  Procedure(s) Performed: TRANSFORAMINAL LUMBAR INTERBODY FUSION (TLIF) LUMBAR FIVE-SACRAL ONE (N/A Spine Lumbar)     Patient location during evaluation: PACU Anesthesia Type: General Level of consciousness: awake and alert Pain management: pain level controlled Vital Signs Assessment: post-procedure vital signs reviewed and stable Respiratory status: spontaneous breathing, nonlabored ventilation, respiratory function stable and patient connected to nasal cannula oxygen Cardiovascular status: blood pressure returned to baseline and stable Postop Assessment: no apparent nausea or vomiting Anesthetic complications: no    Last Vitals:  Vitals:   01/03/18 1326 01/03/18 1606  BP: (!) 141/96 127/90  Pulse: 88 86  Resp: 19 18  Temp: 36.7 C 36.7 C  SpO2: 94% 95%    Last Pain:  Vitals:   01/03/18 1606  TempSrc: Oral  PainSc:                  Orlander Norwood P Khylan Sawyer

## 2018-01-03 NOTE — Anesthesia Procedure Notes (Signed)
Procedure Name: Intubation Date/Time: 01/03/2018 7:39 AM Performed by: Jenne Campus, CRNA Pre-anesthesia Checklist: Patient identified, Emergency Drugs available, Suction available and Patient being monitored Patient Re-evaluated:Patient Re-evaluated prior to induction Oxygen Delivery Method: Circle System Utilized Preoxygenation: Pre-oxygenation with 100% oxygen Induction Type: IV induction Ventilation: Oral airway inserted - appropriate to patient size Laryngoscope Size: Miller and 3 Grade View: Grade I Tube type: Oral Tube size: 7.5 mm Number of attempts: 1 Airway Equipment and Method: Stylet and Oral airway Placement Confirmation: ETT inserted through vocal cords under direct vision,  positive ETCO2 and breath sounds checked- equal and bilateral Secured at: 22 cm Tube secured with: Tape Dental Injury: Teeth and Oropharynx as per pre-operative assessment

## 2018-01-03 NOTE — Evaluation (Signed)
Physical Therapy Evaluation Patient Details Name: [name] [name] MRN: [id number] DOB: April 13, 1961 Today's Date: 01/03/2018   History of Present Illness  Pt is a 57 y.o. M with significant PMH of congenital heart murmur, smoking, RA, shoulder arthroscopy, and is now s/p transforaminal lumbar interbody fusion L5-S1.  Clinical Impression  Patient is s/p above surgery resulting in the deficits listed below (see PT Problem List). Patient independent at baseline. Currently, patient is ambulating 450 feet with walker modified independently. Demonstrates increased independence, balance, and gait speed with walker compared to without an assistive device. Will perform stair training prior to discharge home tomorrow. Patient will benefit from skilled PT to increase their independence and safety with mobility (while adhering to their precautions) to allow discharge to the venue listed below.     Follow Up Recommendations No PT follow up    Equipment Recommendations  Rolling walker with 5" wheels;3in1 (PT)    Recommendations for Other Services       Precautions / Restrictions Precautions Precautions: Fall;Back Precaution Booklet Issued: Yes (comment) Precaution Comments: verbally reviewed precautions and provided handout Required Braces or Orthoses: Spinal Brace Spinal Brace: Applied in sitting position Restrictions Weight Bearing Restrictions: No   (brace not currently in room but per RN, pt allowed to ambulate without it this session)     Mobility  Bed Mobility               General bed mobility comments: OOB in recliner  Transfers Overall transfer level: Modified independent Equipment used: Rolling walker (2 wheeled);None                Ambulation/Gait Ambulation/Gait assistance: Modified independent (Device/Increase time) Gait Distance (Feet): 450 Feet Assistive device: Rolling walker (2 wheeled);None Gait Pattern/deviations: Step-through pattern;Decreased stride  length Gait velocity: decreased   General Gait Details: Initially ambulated ~5 feet without assistive device, but patient reaching for furniture and mildly unsteady. With walker, patient demonstrates increased gait speed and stride length.  Stairs            Wheelchair Mobility    Modified Rankin (Stroke Patients Only)       Balance Overall balance assessment: Mild deficits observed, not formally tested                                           Pertinent Vitals/Pain Pain Assessment: Faces Faces Pain Scale: Hurts even more Pain Location: surgical site Pain Descriptors / Indicators: Grimacing;Guarding Pain Intervention(s): Monitored during session;Limited activity within patient's tolerance    Home Living Family/patient expects to be discharged to:: Private residence Living Arrangements: Spouse/significant other;Non-relatives/Friends(girlfriend and family friend) Available Help at Discharge: Friend(s);Available 24 hours/day Type of Home: House Home Access: Stairs to enter Entrance Stairs-Rails: Chemical engineer of Steps: 4 Home Layout: One level Home Equipment: None      Prior Function Level of Independence: Independent         Comments: Enjoys hunting, fishing, being outside     [employer]        Extremity/Trunk Assessment   Upper Extremity Assessment Upper Extremity Assessment: Overall WFL for tasks assessed    Lower Extremity Assessment Lower Extremity Assessment: Overall WFL for tasks assessed    Cervical / Trunk Assessment Cervical / Trunk Assessment: Normal  Communication   Communication: No difficulties  Cognition Arousal/Alertness: Awake/alert Behavior During Therapy: WFL for tasks assessed/performed Overall Cognitive Status: Within  Functional Limits for tasks assessed                                        General Comments      Exercises     Assessment/Plan    PT  Assessment Patient needs continued PT services  PT Problem List Decreased balance;Decreased mobility;Pain       PT Treatment Interventions Gait training;Stair training;Functional mobility training;Therapeutic activities;Therapeutic exercise;Balance training;Patient/family education    PT Goals (Current goals can be found in the Care Plan section)  Acute Rehab PT Goals Patient Stated Goal: get back to being outside PT Goal Formulation: With patient Time For Goal Achievement: 01/17/18 Potential to Achieve Goals: Good    Frequency Min 5X/week   Barriers to discharge        Co-evaluation               AM-PAC PT "6 Clicks" Daily Activity  Outcome Measure Difficulty turning over in bed (including adjusting bedclothes, sheets and blankets)?: A Little Difficulty moving from lying on back to sitting on the side of the bed? : A Little Difficulty sitting down on and standing up from a chair with arms (e.g., wheelchair, bedside commode, etc,.)?: None Help needed moving to and from a bed to chair (including a wheelchair)?: None Help needed walking in hospital room?: None Help needed climbing 3-5 steps with a railing? : A Little 6 Click Score: 21    End of Session Equipment Utilized During Treatment: Gait belt Activity Tolerance: Patient tolerated treatment well Patient left: Other (comment);with call bell/phone within reach(sitting EOB) Nurse Communication: Mobility status PT Visit Diagnosis: Unsteadiness on feet (R26.81);Pain Pain - part of body: (back)    Time: 6283-6629 PT Time Calculation (min) (ACUTE ONLY): 21 min   Charges:   PT Evaluation $PT Eval Low Complexity: 1 Low     PT G Codes:        Ellamae Sia, PT, DPT Acute Rehabilitation Services  Pager: [phone]   Willy Eddy 01/03/2018, 5:05 PM

## 2018-01-03 NOTE — Op Note (Signed)
Operative report  Preoperative diagnosis: Isthmic spondylolisthesis L5-S1 with significant stenosis and degenerative disc disease.  Postoperative diagnosis: Same  Operative procedure: Transforaminal lumbar interbody fusion L5-S1  First assistant: Ronette Deter, PA  Implant system:  1. Titan nano lock intervertebral cage.  9 mm lordotic extra-large 2.  Nuvasive MIS pedicle screw system:  L5: 45 mm x 7.5 diameter: S1: 40 mm x 7.5.  35 mm rod  Neuro monitoring: L5 pedicle screws: No activity at greater than 40 mA.  S1 pedicle screws: Positive EMG activity at 38 mA.  No abnormal free running EMGs or change in SSEPs throughout the case.  Indications: [name] [name] is a very pleasant gentleman with significant back buttock and radicular leg pain left side worse than the right  Attempted conservative management failed to alleviate his symptoms.  As a result of the ongoing pain and loss in quality of life we elected to move forward with surgery.  All appropriate risks, benefits, and alternatives to surgery were discussed with the patient and consent was obtained.  Operative report: [name] [name] was brought to the operating room placed upon the operating room table.  After successful induction of general anesthesia and endotracheally patient teds SCDs and a Foley were applied.  The neuro monitoring rep then applied all of the intraoperative monitors and the patient was turned prone onto the Wilson frame.  All bony prominences well-padded and the back was prepped and draped in a standard fashion.  Timeout was taken to confirm patient procedure and all other important data.  Because of the significant radicular left leg pain I elected to do the T lift insertion on the left side.  As such I began on the right side with inserting the pedicle screws.  Identified the lateral border of the L5 and the S1 pedicles and marked it with a pen.  The area was infiltrated with quarter percent Marcaine with epinephrine and a small  incision was made.  The Jamshidi needle was then advanced down percutaneously to the lateral aspect of the pedicle.  Once I was properly positioned in the AP plane I advanced the Jamshidi needle using fluoroscopy.  In addition the Jamshidi needle was directly being stimulated to confirm that there was no abnormal activity to suggest breach.  Once I was nearing the medial wall of the pedicle on the AP view switch to the lateral.  I confirmed that I was just beyond the posterior wall of the vertebral body and so I advanced the Jamshidi needle into the vertebral body.  I then placed the guidepin to cannulate the pedicle.  Jamshidi needle was removed and I checked on the AP and lateral to confirm satisfactory overall position of my guidepin.  Using this exact same technique cannulated the S1 pedicle.  I then measured and placed the appropriate size pedicle screws over the guidepin and into the vertebral body.  Again direct stimulation while inserting the pedicle screws demonstrated no abnormal free running EMG activity for suggest breach, and trajectory was well-maintained in both planes.  Once both pedicle screws were properly insisted inserted I confirmed that the x-rays were satisfactory and then directly stimulated each of the pedicle screws.  There was no adverse activity at S1 until we are at 30 mA of stimulation, and there was no adverse activity at greater than 40 mA of stimulation at L5.  I then went back over to the left side again marked out the lateral aspect of the pedicles of L5 and S1.  The stoma made  an incision connecting these areas and then verbally dissected down to the deep fascia.  I incised the deep fascia and then bluntly dissected through the paraspinal musculature until I could palpate the facet capsule.  Using the same technique I used on the contralateral side I cannulated the L5 and the S1 pedicles.  I again measured and placed the screw with the retracting blade attached to it.  Once both  screws were properly seated I again directly stimulated the screws and there was no abnormal activity.  I then mobilized the remaining prevertebral fascia with a Cobb elevator and placed my medial retracting blade.  At this point I could now visualize the L5 lamina, the L5-S1 facet complex, and the pars defect of L5.  Using a 3 mm Kerrison rongeur performed a generous laminotomy of L5.  I then used an osteotome to excise the entire inferior L5 facet.  I then used a Penfield 4 to create a plane between the thecal sac and the ligamentum flavum and then use my 2 and 3 mm Kerrison Roger to remove the ligamentum flavum.  A neuro patty was placed over the exposed thecal sac to protect it.  I then dissected into the lateral gutter using my Penfield for to like identified the medial aspect of the S1 pedicle.  Once this was identified I coagulated the epidural veins in the lateral recess and then resected the overhanging osteophyte from the medial aspect of the S1 superior facet complex.'s foraminotomies was also performed to completely adequately decompress the S1 nerve root.  At this point with the facet removed I removed all of the fibrocartilaginous material that had formed over the pars.  I was able to identify the L5 nerve root in the foramen and protect that with a neuro patty I then continued resecting the remaining portion of the pars until I could visualize the inferior aspect of the L5 pedicle.  At this point an adequate lateral recess decompression as well as a complete foraminal decompression of the L5 nerve root.  I gently began retracting the thecal sac medially and coagulated the large epidural veins as I saw them.  Neuro patties were placed to aid in the retraction inferiorly and superiorly.  This also aid in protecting the exiting and traversing nerve roots once the posterior lateral disc was exposed and annulotomy was performed with a 15 blade scalpel.  Using pituitary rongeurs sidecutting curettes I was  able to remove the bulk of the disc material.  Endplate scrapers were used to prep the disc space.  Once all of the disc material was removed and I had adequate bleeding subchondral bone I irrigated the wound copiously normal saline.  I then trialed with the 8 and 9 paddle distractor and elected to use the size 9 is a provided the best fit.  The 9 mm lordotic extra-large cage was obtained and packed with the autograft that harvested from the decompression along with DBX.  A small piece of the conformer allograft sheath was then placed along the anterior annulus and packed into plac the implant itself was then inserted into the L5-S1 disc space.  Using the bone tamps I then repositioned from a vertical orientated position to horizontal one.  The final resting place was in the anterior third of the disc space.  I then removed all the neuro patties and copiously irrigated the space.  I then used my Surveyor, quantity to confirm had satisfactory decompression.  I was able to visualize and easily  identify the L5 nerve root in the foramen and trace it medially and superiorly along the medial border of the L5 pedicle.  I then traced the S1 pedicle into the foramen and there was no evidence of ongoing stenosis.  Centrally I was able to pass my [facility] along annulus to the contralateral side.  At this point with the decompression complete I then irrigated again and then placed FloSeal to aid in hemostasis.  I then remove the kyphosis that had built into the Wilson frame and measured for the rod.  Polyaxial heads were applied and I placed a 35 rod.  The top locking nuts were then secured and then torqued off according manufacturer standards.  At this point the pedicle rod construct was secured on the left side.  I remove the inserting tabs from the polyaxial heads and then went to the contralateral side.  I measured and then placed the rod and locked into place with a locking knots.  Both the knots were torqued  according manufacturer standards and the insertion tabs were removed.  At this point final x-rays were taken which were satisfactory in both the AP and lateral planes.  All wounds were then copiously irrigated with normal saline and the FloSeal was removed.  I then used bipolar cautery to maintain hemostasis.  After final irrigation I placed a thrombin-soaked Gelfoam patty loosely over the laminotomy defect and then closed the deep fascia with interrupted #1 Vicryl suture.  On the contralateral side the deep fascia was closed with a 0 Vicryl stitch.  I then used a 2-0 Vicryl suture and 3-0 Monocryl for the subcutaneous tissue and skin.  Steri-Strips and dry dressings were applied and the patient was ultimately extubated transfer the PACU without incident.  The end of the case all needle sponge counts were correct.  There are no adverse intraoperative events.

## 2018-01-04 ENCOUNTER — Encounter (HOSPITAL_COMMUNITY): Payer: Self-pay | Admitting: Orthopedic Surgery

## 2018-01-04 NOTE — Progress Notes (Signed)
Physical Therapy Treatment and Discharge Patient Details Name: [name] [name] MRN: [id number] DOB: 11/30/1960 Today's Date: 01/04/2018    History of Present Illness Pt is a 57 y.o. M with significant PMH of congenital heart murmur, smoking, RA, shoulder arthroscopy, and is now s/p transforaminal lumbar interbody fusion L5-S1.    PT Comments    Pt progressing well with post-op mobility. Was able to demonstrate safe ambulation without an AD this morning, and reports decreased pain. Pt was able to participate in stair training, and negotiated 4 stairs without assist. Light supervision provided for safety and adequate for d/c. Pt was educated on car transfer, brace application/wearing schedule, and safe activity progression at home. Will sign off at this time. If needs change, please reconsult.    Follow Up Recommendations  No PT follow up     Equipment Recommendations  3in1 (PT)    Recommendations for Other Services       Precautions / Restrictions Precautions Precautions: Fall;Back Precaution Booklet Issued: No Precaution Comments: verbally reviewed precautions with previously provided handout from PT Required Braces or Orthoses: Spinal Brace Spinal Brace: Applied in sitting position Restrictions Weight Bearing Restrictions: No    Mobility  Bed Mobility Overal bed mobility: Modified Independent             General bed mobility comments: Pt was received sitting EOB   Transfers Overall transfer level: Modified independent Equipment used: None Transfers: Sit to/from Stand Sit to Stand: Min guard         General transfer comment: Pt demonstrated proper hand placement on seated surface for safety. VC's for improved posture during sit>stand but no assist required.   Ambulation/Gait Ambulation/Gait assistance: Modified independent (Device/Increase time) Gait Distance (Feet): 350 Feet Assistive device: None Gait Pattern/deviations: Step-through pattern;Decreased stride  length Gait velocity: Slightly decreased Gait velocity interpretation: 1.31 - 2.62 ft/sec, indicative of limited community ambulator General Gait Details: No unsteadiness or LOB noted without AD. Pt appeared casual and confident with ambulation and did not appear guarded due to pain.    Stairs Stairs: Yes Stairs assistance: Supervision Stair Management: One rail Right;Step to pattern;Forwards Number of Stairs: 4 General stair comments: VC's for sequencing and general safety. Pt was able to negotiate 4 stairs to simulate home environment. Pt reports limiting his speed for comfort.    Wheelchair Mobility    Modified Rankin (Stroke Patients Only)       Balance Overall balance assessment: Mild deficits observed, not formally tested                                          Cognition Arousal/Alertness: Awake/alert Behavior During Therapy: WFL for tasks assessed/performed Overall Cognitive Status: Within Functional Limits for tasks assessed                                        Exercises      General Comments General comments (skin integrity, edema, etc.): pt educated on limiting length of sitting <45 min at a time;pt verbalized understanding to hold off on driving until cleared by physician;pt has assit for transportation to appointments      Pertinent Vitals/Pain Pain Assessment: 0-10 Pain Score: 2  Pain Location: surgical site Pain Descriptors / Indicators: Operative site guarding Pain Intervention(s): Monitored during session  Home Living Family/patient expects to be discharged to:: Private residence Living Arrangements: Spouse/significant other;Non-relatives/Friends(girlfriend and family friend) Available Help at Discharge: Friend(s);Available 24 hours/day Type of Home: House Home Access: Stairs to enter Entrance Stairs-Rails: Left;Right Home Layout: One level Home Equipment: Shower seat;Grab bars - toilet;Grab bars -  tub/shower      Prior Function Level of Independence: Independent      Comments: Enjoys hunting, fishing, being outside   PT Goals (current goals can now be found in the care plan section) Acute Rehab PT Goals Patient Stated Goal: go home PT Goal Formulation: With patient Time For Goal Achievement: 01/17/18 Potential to Achieve Goals: Good Progress towards PT goals: Goals met/education completed, patient discharged from PT    Frequency    Min 5X/week      PT Plan Equipment recommendations need to be updated    Co-evaluation              AM-PAC PT "6 Clicks" Daily Activity  Outcome Measure  Difficulty turning over in bed (including adjusting bedclothes, sheets and blankets)?: None Difficulty moving from lying on back to sitting on the side of the bed? : None Difficulty sitting down on and standing up from a chair with arms (e.g., wheelchair, bedside commode, etc,.)?: None Help needed moving to and from a bed to chair (including a wheelchair)?: None Help needed walking in hospital room?: None Help needed climbing 3-5 steps with a railing? : A Little 6 Click Score: 23    End of Session Equipment Utilized During Treatment: Gait belt Activity Tolerance: Patient tolerated treatment well Patient left: Other (comment);with call bell/phone within reach(sitting EOB awaiting OT) Nurse Communication: Mobility status PT Visit Diagnosis: Unsteadiness on feet (R26.81);Pain Pain - part of body: (back)     Time: 0750-0809 PT Time Calculation (min) (ACUTE ONLY): 19 min  Charges:  $Gait Training: 8-22 mins                    G Codes:       Rolinda Roan, PT, DPT Acute Rehabilitation Services Pager: [phone]    Thelma Comp 01/04/2018, 8:43 AM

## 2018-01-04 NOTE — Progress Notes (Signed)
Patient alert and oriented, mae's well, voiding adequate amount of urine, swallowing without difficulty,  C/o mild  pain at time of discharge. Patient discharged home with family. Script and discharged instructions given to patient. Patient and family stated understanding of instructions given. Patient has an appointment with Dr. Rolena Infante

## 2018-01-04 NOTE — Progress Notes (Addendum)
    Subjective: 1 Day Post-Op Procedure(s) (LRB): TRANSFORAMINAL LUMBAR INTERBODY FUSION (TLIF) LUMBAR FIVE-SACRAL ONE (N/A) Patient reports pain as 7 on 0-10 scale.   Denies CP or SOB.  Voiding without difficulty. Positive BM. Pt ambulating in hallway.  Decreased leg pain.   Objective: Vital signs in last 24 hours: Temp:  [97.8 F (36.6 C)-98.8 F (37.1 C)] 97.8 F (36.6 C) (07/12 0355) Pulse Rate:  [71-92] 71 (07/12 0355) Resp:  [12-20] 17 (07/12 0355) BP: (122-153)/(63-96) 141/85 (07/12 0355) SpO2:  [90 %-96 %] 94 % (07/12 0355)  Intake/Output from previous day: 07/11 0701 - 07/12 0700 In: 2961 [P.O.:360; I.V.:2151; IV Piggyback:450] Out: 1150 [Urine:650; Blood:500] Intake/Output this shift: No intake/output data recorded.  Labs: No results for input(s): HGB in the last 72 hours. No results for input(s): WBC, RBC, HCT, PLT in the last 72 hours. No results for input(s): NA, K, CL, CO2, BUN, CREATININE, GLUCOSE, CALCIUM in the last 72 hours. No results for input(s): LABPT, INR in the last 72 hours.  Physical Exam: Neurologically intact ABD soft Sensation intact distally Dorsiflexion/Plantar flexion intact Incision: scant drainage Compartment soft Body mass index is 30.51 kg/m.   Assessment/Plan: 1 Day Post-Op Procedure(s) (LRB): TRANSFORAMINAL LUMBAR INTERBODY FUSION (TLIF) LUMBAR FIVE-SACRAL ONE (N/A) Advance diet Up with therapy  Pt may DC after cleared by PT Meds and AVS on chart   [facility], Darla Lesches for Dr. Melina Schools [facility] [phone] 01/04/2018, 7:14 AM

## 2018-01-04 NOTE — Progress Notes (Signed)
Occupational Therapy Evaluation Patient Details Name: [name] [name] MRN: [id number] DOB: [birth year] Today's Date: 01/04/2018    History of Present Illness Pt is a 57 y.o. M with significant PMH of congenital heart murmur, smoking, RA, shoulder arthroscopy, and is now s/p transforaminal lumbar interbody fusion L5-S1.   Clinical Impression   PTA, pt was living at home and was independent with ADLs but reports difficulty with LB ADLs and independent with functional mobility. Pt currently requires minguard for functional mobility and was able to complete LB dressing with mod independence after demonstrated of use of AE. Pt was able to problem solve strategies for adherence to back precautions with provided scenarios of obstacle pt may encounter after d/c home.  All education complete and pt has no additional questions, no further acute OT needs identified. Pt ready to d/c home when medically stable. OT to sign off. Thank you for referral.      Follow Up Recommendations  No OT follow up;Supervision - Intermittent    Equipment Recommendations  3 in 1 bedside commode    Recommendations for Other Services       Precautions / Restrictions Precautions Precautions: Fall;Back Precaution Booklet Issued: No Precaution Comments: verbally reviewed precautions with previously provided handout from PT Required Braces or Orthoses: Spinal Brace Spinal Brace: Applied in sitting position Restrictions Weight Bearing Restrictions: No      Mobility Bed Mobility Overal bed mobility: Modified Independent             General bed mobility comments: pt verbalized correct process for bed mobility prior to sit>supine  Transfers Overall transfer level: Needs assistance Equipment used: None Transfers: Sit to/from Stand Sit to Stand: Min guard         General transfer comment: pt required min guard for sit<>stand for adherence to back precautions and minor instability noted, no LOB, no physical  assist provided    Balance Overall balance assessment: Mild deficits observed, not formally tested                                         ADL either performed or assessed with clinical judgement   ADL Overall ADL's : Needs assistance/impaired                                     Functional mobility during ADLs: Min guard General ADL Comments: pt able to return demonstrate use of sock aide and reacher to assist with LB dressing;pt to purchase AE at later date;pt verbalized good awareness of precautions and was able to problem solve adherence to precautions given scenarios at home;pt able to don brace with VC for proper positioning;pt verbalized good understanding of wear schedule for brace;educated pt on environmental modifications,placing items at countertop height to assist with adherence to precautions     Vision         Perception     Praxis      Pertinent Vitals/Pain Pain Assessment: 0-10 Pain Score: 7  Pain Location: surgical site Pain Descriptors / Indicators: Grimacing;Guarding Pain Intervention(s): Monitored during session;Limited activity within patient's tolerance     Hand Dominance Right   Extremity/Trunk Assessment Upper Extremity Assessment Upper Extremity Assessment: Overall WFL for tasks assessed   Lower Extremity Assessment Lower Extremity Assessment: Overall WFL for tasks assessed   Cervical / Trunk Assessment  Cervical / Trunk Assessment: Normal   Communication Communication Communication: No difficulties   Cognition Arousal/Alertness: Awake/alert Behavior During Therapy: WFL for tasks assessed/performed Overall Cognitive Status: Within Functional Limits for tasks assessed                                     General Comments  pt educated on limiting length of sitting <45 min at a time;pt verbalized understanding to hold off on driving until cleared by physician;pt has assit for transportation to  appointments    Exercises     Shoulder Instructions      Home Living Family/patient expects to be discharged to:: Private residence Living Arrangements: Spouse/significant other;Non-relatives/Friends(girlfriend and family friend) Available Help at Discharge: Friend(s);Available 24 hours/day Type of Home: House Home Access: Stairs to enter [employer] of Steps: 4 Entrance Stairs-Rails: Left;Right Home Layout: One level     Bathroom Shower/Tub: Teacher, early years/pre: Standard     Home Equipment: Shower seat;Grab bars - toilet;Grab bars - tub/shower          Prior Functioning/Environment Level of Independence: Independent        Comments: Enjoys hunting, fishing, being outside        OT Problem List: Decreased range of motion;Decreased activity tolerance;Impaired balance (sitting and/or standing);Decreased knowledge of precautions;Decreased knowledge of use of DME or AE;Pain      OT Treatment/Interventions:      OT Goals(Current goals can be found in the care plan section) Acute Rehab OT Goals Patient Stated Goal: go home OT Goal Formulation: With patient Time For Goal Achievement: 01/11/18 Potential to Achieve Goals: Good  OT Frequency:     Barriers to D/C:            Co-evaluation              AM-PAC PT "6 Clicks" Daily Activity     Outcome Measure Help from another person eating meals?: None Help from another person taking care of personal grooming?: A Little Help from another person toileting, which includes using toliet, bedpan, or urinal?: A Little Help from another person bathing (including washing, rinsing, drying)?: A Little Help from another person to put on and taking off regular upper body clothing?: None Help from another person to put on and taking off regular lower body clothing?: A Little 6 Click Score: 20   End of Session Equipment Utilized During Treatment: Back brace Nurse Communication: Mobility  status  Activity Tolerance: Patient tolerated treatment well Patient left: in bed;with call bell/phone within reach  OT Visit Diagnosis: Other abnormalities of gait and mobility (R26.89);Pain Pain - part of body: (back)                Time: 8299-3716 OT Time Calculation (min): 15 min Charges:    G-Codes:     Dorinda Hill OTS    Dorinda Hill 01/04/2018, 8:38 AM

## 2018-01-04 NOTE — Progress Notes (Signed)
OT Note - Addendum    01/04/18 1000  OT Visit Information  Last OT Received On 01/04/18  OT Time Calculation  OT Start Time (ACUTE ONLY) 0871  OT Stop Time (ACUTE ONLY) 9941  OT Time Calculation (min) 15 min  Maurie Boettcher, OT/L  OT Clinical Specialist [phone]

## 2018-01-07 NOTE — Discharge Summary (Signed)
Physician Discharge Summary  Patient ID: [name] [name] MRN: [id number] DOB/AGE: Jun 20, 1961 57 y.o.  Admit date: 01/03/2018 Discharge date: 01/04/18 Admission Diagnoses:  Lumbar degenerative disc disease with spondylolisthesis Discharge Diagnoses:  Active Problems:   S/P lumbar fusion   Past Medical History:  Diagnosis Date  . Anemia   . DDD (degenerative disc disease), lumbosacral     and grade 2 slip  . GERD (gastroesophageal reflux disease)   . Heart murmur   . History of anemia    no current med.  . Hypertension    states is borderline on medication; has been on med. x 5-6 yr.  . Impingement syndrome of shoulder region 10/2014   left  . Lupus ([place])   . Pneumonia   . RA (rheumatoid arthritis) ([place])     Surgeries: Procedure(s): TRANSFORAMINAL LUMBAR INTERBODY FUSION (TLIF) LUMBAR FIVE-SACRAL ONE on 01/03/2018   Consultants (if any):   Discharged Condition: Improved  Hospital Course: KELVIS BERGER is an 57 y.o. male who was admitted 01/03/2018 with a diagnosis of Lumbar DDD with spondylolisthesis and went to the operating room on 01/03/2018 and underwent the above named procedures.  Post op day 1 pt reports moderate pain.  He reports decreased leg pain. Pt has ambulated in hallway.  Pt is urinating and had a BM.  Pt is cleared by PT/OT.   He was given perioperative antibiotics:  Anti-infectives (From admission, onward)   Start     Dose/Rate Route Frequency Ordered Stop   01/03/18 1600  ceFAZolin (ANCEF) IVPB 2g/100 mL premix     2 g 200 mL/hr over 30 Minutes Intravenous Every 8 hours 01/03/18 1332 01/03/18 2327   01/03/18 0603  ceFAZolin (ANCEF) IVPB 2g/100 mL premix     2 g 200 mL/hr over 30 Minutes Intravenous 30 min pre-op 01/03/18 0603 01/03/18 0801    .  He was given sequential compression devices, early ambulation, and TED for DVT prophylaxis.  He benefited maximally from the hospital stay and there were no complications.    Recent vital signs:  Vitals:   01/04/18 0355 01/04/18 0717  BP: (!) 141/85 (!) 126/104  Pulse: 71 78  Resp: 17 18  Temp: 97.8 F (36.6 C) 98.1 F (36.7 C)  SpO2: 94% 94%    Recent laboratory studies:  Lab Results  Component Value Date   HGB 14.3 12/21/2017   HGB 13.9 03/13/2017   HGB 12.3 (L) 01/26/2016   Lab Results  Component Value Date   WBC 9.0 12/21/2017   PLT 225 12/21/2017   Lab Results  Component Value Date   INR 1.0 03/13/2017   Lab Results  Component Value Date   NA 140 12/21/2017   K 3.2 (L) 12/21/2017   CL 104 12/21/2017   CO2 26 12/21/2017   BUN 10 12/21/2017   CREATININE 1.11 12/21/2017   GLUCOSE 129 (H) 12/21/2017    Discharge Medications:   Allergies as of 01/04/2018      Reactions   Morphine And Related Rash   Ramipril Rash   Per patient on R arm and leg only; no angioedema   Tramadol Itching, Rash      Medication List    STOP taking these medications   aspirin 81 MG EC tablet     TAKE these medications   amLODipine 10 MG tablet Commonly known as:  NORVASC TAKE 1 TABLET (10 MG TOTAL) BY MOUTH DAILY.   chlorthalidone 25 MG tablet Commonly known as:  HYGROTON TAKE 1 TABLET  BY MOUTH EVERY DAY   HUMIRA PEN 40 MG/0.4ML Pnkt Generic drug:  Adalimumab Inject 40 mg into the skin every 14 (fourteen) days. Every other Thursdays   methocarbamol 500 MG tablet Commonly known as:  ROBAXIN Take 1 tablet (500 mg total) by mouth 3 (three) times daily.   methotrexate 2.5 MG tablet Commonly known as:  RHEUMATREX Take 12.5 mg by mouth every Friday. In the morning.   naloxone 2 MG/2ML injection Commonly known as:  NARCAN CALL 911, INJECT THE INTRAMUSCULARLY IN SHOULDER OR THIGH. REPEAT EVERY 3 MINUTES   ondansetron 4 MG disintegrating tablet Commonly known as:  ZOFRAN ODT Take 1 tablet (4 mg total) by mouth every 8 (eight) hours as needed for nausea or vomiting.   oxyCODONE-acetaminophen 10-325 MG tablet Commonly known as:  PERCOCET Take 1 tablet by mouth every 4 (four)  hours as needed for up to 5 days for pain. What changed:  when to take this   Potassium Chloride ER 20 MEQ Tbcr Take 20 mEq by mouth 2 (two) times daily for 2 doses.   predniSONE 5 MG tablet Commonly known as:  DELTASONE Take 5 mg by mouth daily.       Diagnostic Studies: Dg Lumbar Spine 2-3 Views  Result Date: 01/03/2018 CLINICAL DATA:  L5-S1 TLIF. EXAM: LUMBAR SPINE - 2-3 VIEW; DG C-ARM 61-120 MIN COMPARISON:  CT lumbar spine dated November 26, 2017. FLUOROSCOPY TIME:  239.1 seconds. C-arm fluoroscopic images were obtained intraoperatively and submitted for post operative interpretation. FINDINGS: Multiple intraoperative fluoroscopic images demonstrate interval L5-S1 posterior and interbody fusion. Unchanged grade 1 anterolisthesis at L5-S1. No acute abnormality. IMPRESSION: Interval L5-S1 TLIF. Electronically Signed   By: Titus Dubin M.D.   On: 01/03/2018 11:07   Dg C-arm 1-60 Min  Result Date: 01/03/2018 CLINICAL DATA:  L5-S1 TLIF. EXAM: LUMBAR SPINE - 2-3 VIEW; DG C-ARM 61-120 MIN COMPARISON:  CT lumbar spine dated November 26, 2017. FLUOROSCOPY TIME:  239.1 seconds. C-arm fluoroscopic images were obtained intraoperatively and submitted for post operative interpretation. FINDINGS: Multiple intraoperative fluoroscopic images demonstrate interval L5-S1 posterior and interbody fusion. Unchanged grade 1 anterolisthesis at L5-S1. No acute abnormality. IMPRESSION: Interval L5-S1 TLIF. Electronically Signed   By: Titus Dubin M.D.   On: 01/03/2018 11:07   Dg C-arm 1-60 Min  Result Date: 01/03/2018 CLINICAL DATA:  L5-S1 TLIF. EXAM: LUMBAR SPINE - 2-3 VIEW; DG C-ARM 61-120 MIN COMPARISON:  CT lumbar spine dated November 26, 2017. FLUOROSCOPY TIME:  239.1 seconds. C-arm fluoroscopic images were obtained intraoperatively and submitted for post operative interpretation. FINDINGS: Multiple intraoperative fluoroscopic images demonstrate interval L5-S1 posterior and interbody fusion. Unchanged grade 1  anterolisthesis at L5-S1. No acute abnormality. IMPRESSION: Interval L5-S1 TLIF. Electronically Signed   By: Titus Dubin M.D.   On: 01/03/2018 11:07   Dg C-arm 1-60 Min  Result Date: 01/03/2018 CLINICAL DATA:  L5-S1 TLIF. EXAM: LUMBAR SPINE - 2-3 VIEW; DG C-ARM 61-120 MIN COMPARISON:  CT lumbar spine dated November 26, 2017. FLUOROSCOPY TIME:  239.1 seconds. C-arm fluoroscopic images were obtained intraoperatively and submitted for post operative interpretation. FINDINGS: Multiple intraoperative fluoroscopic images demonstrate interval L5-S1 posterior and interbody fusion. Unchanged grade 1 anterolisthesis at L5-S1. No acute abnormality. IMPRESSION: Interval L5-S1 TLIF. Electronically Signed   By: Titus Dubin M.D.   On: 01/03/2018 11:07    Disposition:  Pt will present to the clinic in 2 weeks Post up meds provided for 5 days Pt will resume med from Pain management after 5 days  Discharge Instructions    Incentive spirometry RT   Complete by:  As directed       Follow-up Information    Melina Schools, MD Follow up in 2 week(s).   Specialty:  Orthopedic Surgery Contact information: [address] [place] [address] [phone]            Signed: Valinda Hoar 01/07/2018, 1:21 PM

## 2018-01-16 ENCOUNTER — Encounter: Payer: Self-pay | Admitting: *Deleted

## 2018-01-18 DIAGNOSIS — M47817 Spondylosis without myelopathy or radiculopathy, lumbosacral region: Secondary | ICD-10-CM | POA: Diagnosis not present

## 2018-01-18 DIAGNOSIS — M069 Rheumatoid arthritis, unspecified: Secondary | ICD-10-CM | POA: Diagnosis not present

## 2018-01-18 DIAGNOSIS — G894 Chronic pain syndrome: Secondary | ICD-10-CM | POA: Diagnosis not present

## 2018-01-21 DIAGNOSIS — H04123 Dry eye syndrome of bilateral lacrimal glands: Secondary | ICD-10-CM | POA: Diagnosis not present

## 2018-01-21 DIAGNOSIS — H01021 Squamous blepharitis right upper eyelid: Secondary | ICD-10-CM | POA: Diagnosis not present

## 2018-01-21 DIAGNOSIS — H01024 Squamous blepharitis left upper eyelid: Secondary | ICD-10-CM | POA: Diagnosis not present

## 2018-01-21 DIAGNOSIS — H01022 Squamous blepharitis right lower eyelid: Secondary | ICD-10-CM | POA: Diagnosis not present

## 2018-01-21 DIAGNOSIS — H01025 Squamous blepharitis left lower eyelid: Secondary | ICD-10-CM | POA: Diagnosis not present

## 2018-02-15 DIAGNOSIS — Z79899 Other long term (current) drug therapy: Secondary | ICD-10-CM | POA: Diagnosis not present

## 2018-02-15 DIAGNOSIS — M5137 Other intervertebral disc degeneration, lumbosacral region: Secondary | ICD-10-CM | POA: Diagnosis not present

## 2018-02-15 DIAGNOSIS — G894 Chronic pain syndrome: Secondary | ICD-10-CM | POA: Diagnosis not present

## 2018-02-15 DIAGNOSIS — Z79891 Long term (current) use of opiate analgesic: Secondary | ICD-10-CM | POA: Diagnosis not present

## 2018-02-15 DIAGNOSIS — M47817 Spondylosis without myelopathy or radiculopathy, lumbosacral region: Secondary | ICD-10-CM | POA: Diagnosis not present

## 2018-02-15 DIAGNOSIS — M069 Rheumatoid arthritis, unspecified: Secondary | ICD-10-CM | POA: Diagnosis not present

## 2018-02-17 ENCOUNTER — Other Ambulatory Visit: Payer: Self-pay | Admitting: Internal Medicine

## 2018-02-17 DIAGNOSIS — I10 Essential (primary) hypertension: Secondary | ICD-10-CM

## 2018-02-18 DIAGNOSIS — Z4789 Encounter for other orthopedic aftercare: Secondary | ICD-10-CM | POA: Diagnosis not present

## 2018-02-18 NOTE — Telephone Encounter (Signed)
Can pt have an appt with PCP in 1-2 months for routine F/U?

## 2018-03-15 DIAGNOSIS — M5137 Other intervertebral disc degeneration, lumbosacral region: Secondary | ICD-10-CM | POA: Diagnosis not present

## 2018-03-15 DIAGNOSIS — G894 Chronic pain syndrome: Secondary | ICD-10-CM | POA: Diagnosis not present

## 2018-03-15 DIAGNOSIS — M47817 Spondylosis without myelopathy or radiculopathy, lumbosacral region: Secondary | ICD-10-CM | POA: Diagnosis not present

## 2018-03-18 DIAGNOSIS — M545 Low back pain: Secondary | ICD-10-CM | POA: Diagnosis not present

## 2018-04-01 DIAGNOSIS — M79641 Pain in right hand: Secondary | ICD-10-CM | POA: Diagnosis not present

## 2018-04-01 DIAGNOSIS — M06841 Other specified rheumatoid arthritis, right hand: Secondary | ICD-10-CM | POA: Diagnosis not present

## 2018-04-05 ENCOUNTER — Encounter: Payer: Medicare Other | Admitting: Internal Medicine

## 2018-04-05 ENCOUNTER — Encounter: Payer: Self-pay | Admitting: Internal Medicine

## 2018-04-12 DIAGNOSIS — M069 Rheumatoid arthritis, unspecified: Secondary | ICD-10-CM | POA: Diagnosis not present

## 2018-04-12 DIAGNOSIS — M47817 Spondylosis without myelopathy or radiculopathy, lumbosacral region: Secondary | ICD-10-CM | POA: Diagnosis not present

## 2018-04-12 DIAGNOSIS — G894 Chronic pain syndrome: Secondary | ICD-10-CM | POA: Diagnosis not present

## 2018-04-26 ENCOUNTER — Emergency Department (HOSPITAL_COMMUNITY)
Admission: EM | Admit: 2018-04-26 | Discharge: 2018-04-27 | Disposition: A | Discharge status: Home / Self Care | Payer: Medicare Other | Attending: Emergency Medicine | Admitting: Emergency Medicine

## 2018-04-26 ENCOUNTER — Encounter (HOSPITAL_COMMUNITY): Payer: Self-pay | Admitting: Emergency Medicine

## 2018-04-26 ENCOUNTER — Emergency Department (HOSPITAL_COMMUNITY): Payer: Medicare Other

## 2018-04-26 ENCOUNTER — Other Ambulatory Visit: Payer: Self-pay

## 2018-04-26 DIAGNOSIS — I10 Essential (primary) hypertension: Secondary | ICD-10-CM | POA: Insufficient documentation

## 2018-04-26 DIAGNOSIS — M79675 Pain in left toe(s): Secondary | ICD-10-CM | POA: Diagnosis present

## 2018-04-26 DIAGNOSIS — F17210 Nicotine dependence, cigarettes, uncomplicated: Secondary | ICD-10-CM | POA: Insufficient documentation

## 2018-04-26 DIAGNOSIS — L97529 Non-pressure chronic ulcer of other part of left foot with unspecified severity: Secondary | ICD-10-CM | POA: Insufficient documentation

## 2018-04-26 DIAGNOSIS — Z79899 Other long term (current) drug therapy: Secondary | ICD-10-CM | POA: Diagnosis not present

## 2018-04-26 DIAGNOSIS — M7989 Other specified soft tissue disorders: Secondary | ICD-10-CM | POA: Diagnosis not present

## 2018-04-26 DIAGNOSIS — S99922A Unspecified injury of left foot, initial encounter: Secondary | ICD-10-CM | POA: Diagnosis not present

## 2018-04-26 DIAGNOSIS — L03032 Cellulitis of left toe: Secondary | ICD-10-CM

## 2018-04-26 DIAGNOSIS — I2510 Atherosclerotic heart disease of native coronary artery without angina pectoris: Secondary | ICD-10-CM | POA: Insufficient documentation

## 2018-04-26 DIAGNOSIS — E876 Hypokalemia: Secondary | ICD-10-CM | POA: Insufficient documentation

## 2018-04-26 LAB — CBC WITH DIFFERENTIAL/PLATELET
Abs Immature Granulocytes: 0.02 10*3/uL (ref 0.00–0.07)
BASOS PCT: 1 %
Basophils Absolute: 0.1 10*3/uL (ref 0.0–0.1)
EOS ABS: 0.3 10*3/uL (ref 0.0–0.5)
Eosinophils Relative: 3 %
HCT: 42 % (ref 39.0–52.0)
Hemoglobin: 13.2 g/dL (ref 13.0–17.0)
Immature Granulocytes: 0 %
Lymphocytes Relative: 32 %
Lymphs Abs: 2.5 10*3/uL (ref 0.7–4.0)
MCH: 28.2 pg (ref 26.0–34.0)
MCHC: 31.4 g/dL (ref 30.0–36.0)
MCV: 89.7 fL (ref 80.0–100.0)
MONO ABS: 0.5 10*3/uL (ref 0.1–1.0)
MONOS PCT: 6 %
Neutro Abs: 4.5 10*3/uL (ref 1.7–7.7)
Neutrophils Relative %: 58 %
PLATELETS: 280 10*3/uL (ref 150–400)
RBC: 4.68 MIL/uL (ref 4.22–5.81)
RDW: 15.6 % — AB (ref 11.5–15.5)
WBC: 7.8 10*3/uL (ref 4.0–10.5)
nRBC: 0 % (ref 0.0–0.2)

## 2018-04-26 LAB — BASIC METABOLIC PANEL
Anion gap: 6 (ref 5–15)
BUN: 12 mg/dL (ref 6–20)
CALCIUM: 9.3 mg/dL (ref 8.9–10.3)
CO2: 29 mmol/L (ref 22–32)
Chloride: 106 mmol/L (ref 98–111)
Creatinine, Ser: 0.96 mg/dL (ref 0.61–1.24)
GFR calc Af Amer: >60 mL/min (ref >60)
GLUCOSE: 112 mg/dL — AB (ref 70–99)
Potassium: 2.6 mmol/L — CL (ref 3.5–5.1)
Sodium: 141 mmol/L (ref 135–145)

## 2018-04-26 LAB — I-STAT CG4 LACTIC ACID, ED: LACTIC ACID, VENOUS: 1.58 mmol/L (ref 0.5–1.9)

## 2018-04-26 MED ORDER — POTASSIUM CHLORIDE CRYS ER 20 MEQ PO TBCR: 40.0000 meq | EXTENDED_RELEASE_TABLET | Freq: Every day | ORAL | 0 refills | Status: DC

## 2018-04-26 MED ORDER — POTASSIUM CHLORIDE 10 MEQ/100ML IV SOLN
10.0000 meq | Freq: Once | INTRAVENOUS | Status: AC
  Administered 2018-04-26: 10 meq via INTRAVENOUS
  Filled 2018-04-26: qty 100

## 2018-04-26 MED ORDER — POTASSIUM CHLORIDE CRYS ER 20 MEQ PO TBCR
40.0000 meq | EXTENDED_RELEASE_TABLET | Freq: Once | ORAL | Status: AC
  Administered 2018-04-26: 40 meq via ORAL
  Filled 2018-04-26: qty 2

## 2018-04-26 MED ORDER — DOXYCYCLINE HYCLATE 100 MG PO CAPS: 100.0000 mg | ORAL_CAPSULE | Freq: Two times a day (BID) | ORAL | 0 refills | Status: DC

## 2018-04-26 MED ORDER — DOXYCYCLINE HYCLATE 100 MG PO TABS
100.0000 mg | ORAL_TABLET | Freq: Once | ORAL | Status: AC
  Administered 2018-04-26: 100 mg via ORAL
  Filled 2018-04-26: qty 1

## 2018-04-26 NOTE — Discharge Instructions (Signed)
You were started on antibiotic to prevent developing or worsening infection to your left great toe. Take doxycycline as prescribed until finished.  We also recommend that you apply topical bacitracin twice a day.  Follow-up with a podiatrist for wound recheck.  You may also follow-up with your primary care doctor for this, if desired.  Your found to have low potassium while in the ED.  Try to increase the amount of potassium you consume in your diet.  Your started on potassium tablets to take over the next 5 days.  You should have this level rechecked by your primary care doctor within the week as well.  Return to the ED for new or concerning symptoms.

## 2018-04-26 NOTE — ED Triage Notes (Signed)
Left great toe with on going pain and infection. Drainage for 3 weeks. He has been using epsom salt. Takes Humira for RA, denies fever. PA at triage.

## 2018-04-26 NOTE — ED Notes (Signed)
Potassium 2.6 per lab.  Dr. [name] and Herbert Spires (nurse first) notified.

## 2018-04-26 NOTE — ED Provider Notes (Signed)
[facility] EMERGENCY DEPARTMENT Provider Note   CSN: [id number] Arrival date & time: 04/26/18  1804     History   Chief Complaint Chief Complaint  Patient presents with  . Nail Problem    HPI [name] [name] is a 57 y.o. male.  57 year old male with a history of rheumatoid arthritis on Humira presents to the emergency department for evaluation of left great toe pain.  He states that he has been experiencing pain x3 weeks with an ulceration to his digit.  He has had some increasing discomfort and tenderness as well as erythema.  He notes a small amount of drainage from the site.  His pain is aggravated with ambulation.  He has tried Epsom salt soaks without relief as well as topical abx ointment.  He is not a diabetic.  He has had no numbness or paresthesias.  No fevers, purulent drainage from the site.  Denies any trauma or injury.  Has tried cutting a hole in his shoe to prevent rubbing on the area.  The history is provided by the patient. No language interpreter was used.    Past Medical History:  Diagnosis Date  . Anemia   . DDD (degenerative disc disease), lumbosacral     and grade 2 slip  . GERD (gastroesophageal reflux disease)   . Heart murmur   . History of anemia    no current med.  . Hypertension    states is borderline on medication; has been on med. x 5-6 yr.  . Impingement syndrome of shoulder region 10/2014   left  . Lupus ([place])   . Pneumonia   . RA (rheumatoid arthritis) [facility])     Patient Active Problem List   Diagnosis Date Noted  . S/P lumbar fusion 01/03/2018  . Coronary artery disease involving native coronary artery of native heart with unstable angina pectoris ([place])   . Preventative health care 03/13/2017  . 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 Left shoulder pain 12/16/2015  . Transaminitis 12/16/2015  . Insomnia 06/01/2015  . Rotator cuff tear 11/04/2014  . Bilateral shoulder pain 08/20/2012  . Tobacco use disorder 08/20/2012  .  Anemia 08/06/2012  . HTN (hypertension) 04/20/2011  . Low back pain without sciatica 10/29/2009  . HLD (hyperlipidemia) 10/08/2009  . Rheumatoid arthritis ([place]) 08/11/2009    Past Surgical History:  Procedure Laterality Date  . ACHILLES TENDON SURGERY Bilateral   . LEFT HEART CATH AND CORONARY ANGIOGRAPHY N/A 03/15/2017   Procedure: LEFT HEART CATH AND CORONARY ANGIOGRAPHY;  Surgeon: Nelva Bush, MD;  Location: [facility];  Service: Cardiovascular;  Laterality: N/A;  . OSTEOTOMY AND ULNAR SHORTENING Right 07/22/2002  . SHOULDER ARTHROSCOPY WITH BICEPSTENOTOMY Right 03/11/2015   Procedure: SHOULDER ARTHROSCOPY WITH BICEPSTENOTOMY;  Surgeon: Ninetta Lights, MD;  Location: [facility];  Service: Orthopedics;  Laterality: Right;  . SHOULDER ARTHROSCOPY WITH DISTAL CLAVICLE RESECTION Left 11/19/2014   Procedure: SHOULDER ARTHROSCOPY WITH DISTAL CLAVICLE RESECTION;  Surgeon: Kathryne Hitch, MD;  Location: [facility];  Service: Orthopedics;  Laterality: Left;  . SHOULDER ARTHROSCOPY WITH ROTATOR CUFF REPAIR AND SUBACROMIAL DECOMPRESSION Left 11/19/2014   Procedure: LEFT SHOULDER ARTHROSCOPY, DEBRIDEMENT DISTAL CLAVICLE EXCISION, ACROMIOPLASTY WITH ROTATOR CUFF REPAIR ;  Surgeon: Kathryne Hitch, MD;  Location: [facility];  Service: Orthopedics;  Laterality: Left;  . TRANSFORAMINAL LUMBAR INTERBODY FUSION (TLIF) WITH PEDICLE SCREW FIXATION 1 LEVEL N/A 01/03/2018   Procedure: TRANSFORAMINAL LUMBAR INTERBODY FUSION (TLIF) LUMBAR FIVE-SACRAL ONE;  Surgeon: Melina Schools, MD;  Location: [facility];  Service: Orthopedics;  Laterality: N/A;  . VIDEO ASSISTED THORACOSCOPY (VATS)/DECORTICATION Right 11/21/2011   drainage of empyema        Home Medications    Prior to Admission medications   Medication Sig Start Date End Date Taking? Authorizing Provider  amLODipine (NORVASC) 10 MG tablet TAKE 1 TABLET (10 MG TOTAL) BY MOUTH DAILY. 02/18/18   Jean Rosenthal,  MD  chlorthalidone (HYGROTON) 25 MG tablet TAKE 1 TABLET BY MOUTH EVERY DAY 12/06/17   Rice, Resa Miner, MD  doxycycline (VIBRAMYCIN) 100 MG capsule Take 1 capsule (100 mg total) by mouth 2 (two) times daily. 04/26/18   Antonietta Breach, PA-C  HUMIRA PEN 40 MG/0.4ML PNKT Inject 40 mg into the skin every 14 (fourteen) days. Every other Thursdays 04/09/17   [provider]  methocarbamol (ROBAXIN) 500 MG tablet Take 1 tablet (500 mg total) by mouth 3 (three) times daily. 01/03/18   Mayo, Darla Lesches, PA-C  methotrexate (RHEUMATREX) 2.5 MG tablet Take 12.5 mg by mouth every Friday. In the morning. 11/23/17   [provider]  naloxone (NARCAN) 2 MG/2ML injection CALL 911, INJECT THE INTRAMUSCULARLY IN SHOULDER OR THIGH. REPEAT EVERY 3 MINUTES 01/23/17   [provider]  ondansetron (ZOFRAN ODT) 4 MG disintegrating tablet Take 1 tablet (4 mg total) by mouth every 8 (eight) hours as needed for nausea or vomiting. 01/03/18   Mayo, Darla Lesches, PA-C  potassium chloride 20 MEQ TBCR Take 20 mEq by mouth 2 (two) times daily for 2 doses. 12/21/17 01/03/18  Lenore Cordia, MD  potassium chloride SA (K-DUR,KLOR-CON) 20 MEQ tablet Take 2 tablets (40 mEq total) by mouth daily for 5 days. 04/26/18 05/01/18  Antonietta Breach, PA-C  predniSONE (DELTASONE) 5 MG tablet Take 5 mg by mouth daily. 11/23/17   [provider]    Family History Family History  Problem Relation Age of Onset  . Heart attack Father 21  . Stroke Neg Hx   . Cancer Neg Hx     Social History Social History   Tobacco Use  . Smoking status: Current Every Day Smoker    Packs/day: 0.00    Years: 20.00    Pack years: 0.00    Types: Cigarettes  . Smokeless tobacco: Never Used  . Tobacco comment: 2-3 per day  Substance Use Topics  . Alcohol use: No    Alcohol/week: 0.0 standard drinks    Comment: sober 1998  . Drug use: No     Allergies   Morphine and related; Ramipril; and Tramadol   Review of  Systems Review of Systems Ten systems reviewed and are negative for acute change, except as noted in the HPI.    Physical Exam Updated Vital Signs BP 119/89   Pulse 85   Temp 99 F (37.2 C) (Oral)   Resp 10   Ht 5\' 7"  (1.702 m)   Wt 81.6 kg   SpO2 95%   BMI 28.19 kg/m   Physical Exam  Constitutional: He is oriented to person, place, and time. He appears well-developed and well-nourished. No distress.  Nontoxic appearing and in NAD  HENT:  Head: Normocephalic and atraumatic.  Eyes: Conjunctivae and EOM are normal. No scleral icterus.  Neck: Normal range of motion.  Cardiovascular: Normal rate, regular rhythm and intact distal pulses.  Capillary refill brisk in all digits of the L foot.  Pulmonary/Chest: Effort normal. No respiratory distress.  Musculoskeletal: Normal range of motion.  Ulceration to the medial aspect of the L great toe. There appears to be appropriate granulation tissue to the ulcer, though there is surrounding erythema to the digit. Some TTP. No erythematous streaking. No purulence with minimal serous drainage.  Neurological: He is alert and oriented to person, place, and time.  Skin: Skin is warm and dry. No rash noted. He is not diaphoretic. No erythema. No pallor.  Psychiatric: He has a normal mood and affect. His behavior is normal.  Nursing note and vitals reviewed.        ED Treatments / Results  Labs (all labs ordered are listed, but only abnormal results are displayed) Labs Reviewed  CBC WITH DIFFERENTIAL/PLATELET - Abnormal; Notable for the following components:      Result Value   RDW 15.6 (*)    All other components within normal limits  BASIC METABOLIC PANEL - Abnormal; Notable for the following components:   Potassium 2.6 (*)    Glucose, Bld 112 (*)    All other components within normal limits  MAGNESIUM  I-STAT CG4 LACTIC ACID, ED    EKG EKG Interpretation  Date/Time:  Friday April 26 2018 22:28:13 EDT Ventricular Rate:   88 PR Interval:    QRS Duration: 103 QT Interval:  380 QTC Calculation: 460 R Axis:   28 Text Interpretation:  Sinus rhythm Consider anterior infarct Confirmed by Lennice Sites [phone]) on 04/26/2018 11:45:45 PM   Radiology Dg Toe Great Left  Result Date: 04/26/2018 CLINICAL DATA:  Pain and swelling in the first toe for several weeks, history of ingrown toenail EXAM: LEFT GREAT TOE COMPARISON:  None. FINDINGS: Generalized soft tissue swelling is noted. No acute fracture or dislocation is noted. No definitive erosive changes to suggest osteomyelitis are noted. IMPRESSION: Soft tissue swelling without definitive bony abnormality. Electronically Signed   By: Inez Catalina M.D.   On: 04/26/2018 19:18    Procedures Procedures (including critical care time)  Medications Ordered in ED Medications  potassium chloride 10 mEq in 100 mL IVPB (0 mEq Intravenous Stopped 04/26/18 2344)  potassium chloride SA (K-DUR,KLOR-CON) CR tablet 40 mEq (40 mEq Oral Given 04/26/18 2230)  doxycycline (VIBRA-TABS) tablet 100 mg (100 mg Oral Given 04/26/18 2329)     Initial Impression / Assessment and Plan / ED Course  I have reviewed the triage vital signs and the nursing notes.  Pertinent labs & imaging results that were available during my care of the patient were reviewed by me and considered in my medical decision making (see chart for details).     57 year old male presents to the emergency department for evaluation of a wound to his left great toe.  Symptoms have been present over the past 3 weeks.  There is some associated erythema concerning for cellulitis.  X-ray without definitive erosive changes to suggest osteomyelitis.  The patient also is afebrile and without leukocytosis or elevated lactate level.  He does not meet SIRS or sepsis criteria today.  Plan for coverage with doxycycline.  Have counseled on wound care.  Patient was incidentally found to be hypokalemic.  He seems to have a history of this  during his most recent evaluation.  No concerning EKG changes.  Potassium repleted both orally and IV.  Will send home with a 5-day course of potassium supplementation.  He has been encouraged to have this level rechecked by his primary care doctor.  Return precautions discussed and provided. Patient discharged in stable condition with no unaddressed concerns.   Final Clinical Impressions(s) /  ED Diagnoses   Final diagnoses:  Skin ulcer of left great toe, unspecified ulcer stage ([place])  Cellulitis of toe of left foot  Hypokalemia    ED Discharge Orders         Ordered    potassium chloride SA (K-DUR,KLOR-CON) 20 MEQ tablet  Daily     04/26/18 2344    doxycycline (VIBRAMYCIN) 100 MG capsule  2 times daily     04/26/18 2344           Antonietta Breach, PA-C 04/27/18 0033    Lennice Sites, DO 04/27/18 0201

## 2018-04-26 NOTE — ED Provider Notes (Signed)
Patient placed in Quick Look pathway, seen and evaluated   Chief Complaint: Great toe infection  HPI:   Patient with a history of RA on Humira presents with left great toe pain, erythema and drainage x 3 wks. Has 7/10 severity pain which is worse with ambulation.  He has been trying to soak in Epson salt without improvement.  Denies fevers.  Denies history of diabetes.  ROS: No fever  Physical Exam:   Gen: No distress  Neuro: Awake and Alert  Skin: Warm    Focused Exam: Left great toe erythematous, warm and tender to the touch with purulence draining over lateral aspect. See picture. Sensation to light touch intact in pad of toe.       Initiation of care has begun. The patient has been counseled on the process, plan, and necessity for staying for the completion/evaluation, and the remainder of the medical screening examination    Bernarda Caffey 04/26/18 Rene Kocher, MD 05/02/18 [phone]

## 2018-04-27 LAB — MAGNESIUM: Magnesium: 1.6 mg/dL — ABNORMAL LOW (ref 1.7–2.4)

## 2018-04-27 NOTE — ED Notes (Signed)
Patient verbalizes understanding of discharge instructions. Opportunity for questioning and answers were provided. Armband removed by staff, pt discharged from ED.  

## 2018-05-10 DIAGNOSIS — M069 Rheumatoid arthritis, unspecified: Secondary | ICD-10-CM | POA: Diagnosis not present

## 2018-05-10 DIAGNOSIS — Z79899 Other long term (current) drug therapy: Secondary | ICD-10-CM | POA: Diagnosis not present

## 2018-05-10 DIAGNOSIS — M47817 Spondylosis without myelopathy or radiculopathy, lumbosacral region: Secondary | ICD-10-CM | POA: Diagnosis not present

## 2018-05-10 DIAGNOSIS — Z79891 Long term (current) use of opiate analgesic: Secondary | ICD-10-CM | POA: Diagnosis not present

## 2018-05-10 DIAGNOSIS — G894 Chronic pain syndrome: Secondary | ICD-10-CM | POA: Diagnosis not present

## 2018-05-30 ENCOUNTER — Ambulatory Visit (INDEPENDENT_AMBULATORY_CARE_PROVIDER_SITE_OTHER): Payer: Medicare Other | Admitting: Internal Medicine

## 2018-05-30 ENCOUNTER — Other Ambulatory Visit: Payer: Self-pay

## 2018-05-30 ENCOUNTER — Encounter: Payer: Self-pay | Admitting: Internal Medicine

## 2018-05-30 VITALS — BP 122/84 | HR 88 | Temp 98.2°F | Ht 66.0 in | Wt 182.5 lb

## 2018-05-30 DIAGNOSIS — M0579 Rheumatoid arthritis with rheumatoid factor of multiple sites without organ or systems involvement: Secondary | ICD-10-CM

## 2018-05-30 DIAGNOSIS — M069 Rheumatoid arthritis, unspecified: Secondary | ICD-10-CM | POA: Diagnosis not present

## 2018-05-30 DIAGNOSIS — M21831 Other specified acquired deformities of right forearm: Secondary | ICD-10-CM

## 2018-05-30 DIAGNOSIS — Z7952 Long term (current) use of systemic steroids: Secondary | ICD-10-CM

## 2018-05-30 DIAGNOSIS — E876 Hypokalemia: Secondary | ICD-10-CM | POA: Insufficient documentation

## 2018-05-30 DIAGNOSIS — Z79899 Other long term (current) drug therapy: Secondary | ICD-10-CM

## 2018-05-30 DIAGNOSIS — M21832 Other specified acquired deformities of left forearm: Secondary | ICD-10-CM | POA: Diagnosis not present

## 2018-05-30 DIAGNOSIS — L97519 Non-pressure chronic ulcer of other part of right foot with unspecified severity: Secondary | ICD-10-CM

## 2018-05-30 HISTORY — DX: Hypokalemia: E87.6

## 2018-05-30 MED ORDER — PREDNISONE 5 MG PO TABS: 5.0000 mg | ORAL_TABLET | Freq: Every day | ORAL | 2 refills | Status: DC

## 2018-05-30 NOTE — Assessment & Plan Note (Signed)
Patient was diagnosed with rheumatoid arthritis when he was 57 years old.  He has been well controlled on methotrexate, Humira and prednisone.  He recently had back surgery which subsequently caused him to miss rheumatology appointments and unfortunately the clinic dismissed him.  He came in today for referral for a new rheumatologist and a refill on his prednisone 5 mg daily.  He is currently not taking his Humira and methotrexate in anticipation for hand surgery that he is having next week.  He has bilateral hand D formations and ulnar deviation with joint stiffness and limited mobility.  He is hopeful the surgery will help.  Plan: -Sent in prescription for prednisone 5 mg daily and her -Referral for rheumatology

## 2018-05-30 NOTE — Assessment & Plan Note (Addendum)
Patient was found to be hypokalemic 2.6 during an ED visit.  He was sent home with 40 mEq daily for 5 days.  Given that he is on chlorthalidone, we will recheck his BMP today and orally replete potassium as necessary.  Plan: -Follow-up BMP -Follow-up magnesium

## 2018-05-30 NOTE — Patient Instructions (Addendum)
[name] [name],  It was a pleasure meeting you today.  I sent in a prescription for your prednisone 5 mg daily to your pharmacy. I have also sent a referral to rheumatology.  We are checking your potassium level today because it was low during your last ED visit.  I will call to let you know the results of your blood work.  Follow-up in 6 months with your PCP or as needed if anything comes up in between.  I hope your surgery goes well and happy holidays!

## 2018-05-30 NOTE — Progress Notes (Signed)
   CC: Rheumatoid arthritis   HPI:  Mr.Linnie T Gut is a 57 y.o. with a PMHx listed below resenting today for rheumatology referral and a refill on his prednisone.  For details of today's visit and the status of his chronic medical issues please refer to the assessment and plan.  Past Medical History:  Diagnosis Date  . Anemia   . DDD (degenerative disc disease), lumbosacral     and grade 2 slip  . GERD (gastroesophageal reflux disease)   . Heart murmur   . History of anemia    no current med.  . Hypertension    states is borderline on medication; has been on med. x 5-6 yr.  . Impingement syndrome of shoulder region 10/2014   left  . Lupus ([place])   . Pneumonia   . RA (rheumatoid arthritis) ([place])    Review of Systems:   Review of Systems  Constitutional: Negative for chills and fever.  Cardiovascular: Negative for leg swelling.  Musculoskeletal: Positive for joint pain.  Neurological: Negative for dizziness, weakness and headaches.    Physical Exam:  Vitals:   05/30/18 1345  BP: 122/84  Pulse: 88  Temp: 98.2 F (36.8 C)  TempSrc: Oral  SpO2: 100%  Weight: 182 lb 8 oz (82.8 kg)  Height: 5\' 6"  (1.676 m)   Physical Exam  Constitutional: He is oriented to person, place, and time and well-developed, well-nourished, and in no distress.  Cardiovascular: Normal rate, regular rhythm and normal heart sounds.  No murmur heard. Pulmonary/Chest: Effort normal and breath sounds normal. No respiratory distress. He has no wheezes.  Abdominal: Soft. Bowel sounds are normal. He exhibits no distension. There is no tenderness.  Musculoskeletal: He exhibits deformity. He exhibits no tenderness.  Bilateral hand deformities and ulnar deviation  Neurological: He is alert and oriented to person, place, and time.  Skin: Skin is warm and dry.  Patient has a skin ulcer on right big toe that is healing well, no pus or drainage   Psychiatric: Mood, memory, affect and judgment normal.     Assessment & Plan:   See Encounters Tab for problem based charting.  Patient seen with Dr. Dareen Piano

## 2018-05-31 ENCOUNTER — Telehealth: Payer: Self-pay | Admitting: Internal Medicine

## 2018-05-31 ENCOUNTER — Other Ambulatory Visit: Payer: Self-pay | Admitting: Internal Medicine

## 2018-05-31 DIAGNOSIS — E876 Hypokalemia: Secondary | ICD-10-CM

## 2018-05-31 LAB — BMP8+ANION GAP
ANION GAP: 21 mmol/L — AB (ref 10.0–18.0)
BUN/Creatinine Ratio: 9 (ref 9–20)
BUN: 9 mg/dL (ref 6–24)
CALCIUM: 9.6 mg/dL (ref 8.7–10.2)
CO2: 24 mmol/L (ref 20–29)
CREATININE: 1.02 mg/dL (ref 0.76–1.27)
Chloride: 95 mmol/L — ABNORMAL LOW (ref 96–106)
GFR calc Af Amer: 93 mL/min/{1.73_m2} (ref >59)
GFR, EST NON AFRICAN AMERICAN: 81 mL/min/{1.73_m2} (ref >59)
Glucose: 87 mg/dL (ref 65–99)
POTASSIUM: 3.2 mmol/L — AB (ref 3.5–5.2)
Sodium: 140 mmol/L (ref 134–144)

## 2018-05-31 LAB — MAGNESIUM: MAGNESIUM: 1.7 mg/dL (ref 1.6–2.3)

## 2018-05-31 MED ORDER — POTASSIUM CHLORIDE CRYS ER 20 MEQ PO TBCR: 40.0000 meq | EXTENDED_RELEASE_TABLET | Freq: Every day | ORAL | 0 refills | Status: DC

## 2018-05-31 NOTE — Telephone Encounter (Signed)
K 3.2, called patient and informed him to continue taking 40 meq oral potassium qd for another 5 days.

## 2018-05-31 NOTE — Progress Notes (Signed)
K 3.2, called patient and informed him to continue taking oral potassium for another 5 days.

## 2018-06-04 DIAGNOSIS — M24841 Other specific joint derangements of right hand, not elsewhere classified: Secondary | ICD-10-CM | POA: Diagnosis not present

## 2018-06-04 DIAGNOSIS — G8918 Other acute postprocedural pain: Secondary | ICD-10-CM | POA: Diagnosis not present

## 2018-06-04 DIAGNOSIS — M67941 Unspecified disorder of synovium and tendon, right hand: Secondary | ICD-10-CM | POA: Diagnosis not present

## 2018-06-04 DIAGNOSIS — M19041 Primary osteoarthritis, right hand: Secondary | ICD-10-CM | POA: Diagnosis not present

## 2018-06-07 NOTE — Progress Notes (Signed)
Internal Medicine Clinic Attending  I saw and evaluated the patient.  I personally confirmed the key portions of the history and exam documented by Dr.  Rehman  and I reviewed pertinent patient test results.  The assessment, diagnosis, and plan were formulated together and I agree with the documentation in the resident's note.  

## 2018-06-14 DIAGNOSIS — M47817 Spondylosis without myelopathy or radiculopathy, lumbosacral region: Secondary | ICD-10-CM | POA: Diagnosis not present

## 2018-06-14 DIAGNOSIS — M25559 Pain in unspecified hip: Secondary | ICD-10-CM | POA: Diagnosis not present

## 2018-06-14 DIAGNOSIS — G894 Chronic pain syndrome: Secondary | ICD-10-CM | POA: Diagnosis not present

## 2018-06-14 DIAGNOSIS — M069 Rheumatoid arthritis, unspecified: Secondary | ICD-10-CM | POA: Diagnosis not present

## 2018-06-17 ENCOUNTER — Inpatient Hospital Stay (HOSPITAL_COMMUNITY): Payer: Medicare Other

## 2018-06-17 ENCOUNTER — Encounter (HOSPITAL_COMMUNITY): Payer: Self-pay

## 2018-06-17 ENCOUNTER — Other Ambulatory Visit: Payer: Self-pay

## 2018-06-17 ENCOUNTER — Inpatient Hospital Stay (HOSPITAL_COMMUNITY)
Admission: EM | Admit: 2018-06-17 | Discharge: 2018-06-18 | DRG: 871 | Disposition: A | Discharge status: Home / Self Care | Payer: Medicare Other | Attending: Internal Medicine | Admitting: Internal Medicine

## 2018-06-17 ENCOUNTER — Emergency Department (HOSPITAL_COMMUNITY): Payer: Medicare Other

## 2018-06-17 DIAGNOSIS — E876 Hypokalemia: Secondary | ICD-10-CM | POA: Diagnosis present

## 2018-06-17 DIAGNOSIS — Z885 Allergy status to narcotic agent status: Secondary | ICD-10-CM | POA: Diagnosis not present

## 2018-06-17 DIAGNOSIS — D6959 Other secondary thrombocytopenia: Secondary | ICD-10-CM | POA: Diagnosis not present

## 2018-06-17 DIAGNOSIS — R740 Nonspecific elevation of levels of transaminase and lactic acid dehydrogenase [LDH]: Secondary | ICD-10-CM

## 2018-06-17 DIAGNOSIS — Z79899 Other long term (current) drug therapy: Secondary | ICD-10-CM | POA: Diagnosis not present

## 2018-06-17 DIAGNOSIS — I248 Other forms of acute ischemic heart disease: Secondary | ICD-10-CM | POA: Diagnosis not present

## 2018-06-17 DIAGNOSIS — Z888 Allergy status to other drugs, medicaments and biological substances status: Secondary | ICD-10-CM

## 2018-06-17 DIAGNOSIS — Z7952 Long term (current) use of systemic steroids: Secondary | ICD-10-CM | POA: Diagnosis not present

## 2018-06-17 DIAGNOSIS — R0689 Other abnormalities of breathing: Secondary | ICD-10-CM | POA: Diagnosis not present

## 2018-06-17 DIAGNOSIS — Z720 Tobacco use: Secondary | ICD-10-CM

## 2018-06-17 DIAGNOSIS — I2510 Atherosclerotic heart disease of native coronary artery without angina pectoris: Secondary | ICD-10-CM | POA: Diagnosis present

## 2018-06-17 DIAGNOSIS — J181 Lobar pneumonia, unspecified organism: Secondary | ICD-10-CM

## 2018-06-17 DIAGNOSIS — M069 Rheumatoid arthritis, unspecified: Secondary | ICD-10-CM | POA: Diagnosis not present

## 2018-06-17 DIAGNOSIS — R011 Cardiac murmur, unspecified: Secondary | ICD-10-CM

## 2018-06-17 DIAGNOSIS — R7989 Other specified abnormal findings of blood chemistry: Secondary | ICD-10-CM | POA: Diagnosis not present

## 2018-06-17 DIAGNOSIS — D696 Thrombocytopenia, unspecified: Secondary | ICD-10-CM

## 2018-06-17 DIAGNOSIS — R0789 Other chest pain: Secondary | ICD-10-CM | POA: Diagnosis not present

## 2018-06-17 DIAGNOSIS — R000 Tachycardia, unspecified: Secondary | ICD-10-CM | POA: Diagnosis not present

## 2018-06-17 DIAGNOSIS — I10 Essential (primary) hypertension: Secondary | ICD-10-CM | POA: Diagnosis present

## 2018-06-17 DIAGNOSIS — Z8249 Family history of ischemic heart disease and other diseases of the circulatory system: Secondary | ICD-10-CM | POA: Diagnosis not present

## 2018-06-17 DIAGNOSIS — A419 Sepsis, unspecified organism: Secondary | ICD-10-CM | POA: Diagnosis not present

## 2018-06-17 DIAGNOSIS — I959 Hypotension, unspecified: Secondary | ICD-10-CM | POA: Diagnosis not present

## 2018-06-17 DIAGNOSIS — K219 Gastro-esophageal reflux disease without esophagitis: Secondary | ICD-10-CM | POA: Diagnosis present

## 2018-06-17 DIAGNOSIS — Z886 Allergy status to analgesic agent status: Secondary | ICD-10-CM

## 2018-06-17 DIAGNOSIS — F17210 Nicotine dependence, cigarettes, uncomplicated: Secondary | ICD-10-CM | POA: Diagnosis present

## 2018-06-17 DIAGNOSIS — R6520 Severe sepsis without septic shock: Secondary | ICD-10-CM | POA: Diagnosis not present

## 2018-06-17 DIAGNOSIS — D649 Anemia, unspecified: Secondary | ICD-10-CM

## 2018-06-17 DIAGNOSIS — R05 Cough: Secondary | ICD-10-CM | POA: Diagnosis not present

## 2018-06-17 DIAGNOSIS — R7401 Elevation of levels of liver transaminase levels: Secondary | ICD-10-CM | POA: Diagnosis present

## 2018-06-17 DIAGNOSIS — M5137 Other intervertebral disc degeneration, lumbosacral region: Secondary | ICD-10-CM | POA: Diagnosis present

## 2018-06-17 DIAGNOSIS — J189 Pneumonia, unspecified organism: Secondary | ICD-10-CM | POA: Diagnosis present

## 2018-06-17 DIAGNOSIS — E872 Acidosis: Secondary | ICD-10-CM | POA: Diagnosis present

## 2018-06-17 DIAGNOSIS — R079 Chest pain, unspecified: Secondary | ICD-10-CM | POA: Diagnosis not present

## 2018-06-17 LAB — RESPIRATORY PANEL BY PCR
Adenovirus: NOT DETECTED
Bordetella pertussis: NOT DETECTED
Chlamydophila pneumoniae: NOT DETECTED
Coronavirus 229E: NOT DETECTED
Coronavirus HKU1: NOT DETECTED
Coronavirus NL63: NOT DETECTED
Coronavirus OC43: NOT DETECTED
Influenza A: NOT DETECTED
Influenza B: NOT DETECTED
MYCOPLASMA PNEUMONIAE-RVPPCR: NOT DETECTED
Metapneumovirus: NOT DETECTED
Parainfluenza Virus 1: NOT DETECTED
Parainfluenza Virus 2: NOT DETECTED
Parainfluenza Virus 3: NOT DETECTED
Parainfluenza Virus 4: NOT DETECTED
Respiratory Syncytial Virus: NOT DETECTED
Rhinovirus / Enterovirus: NOT DETECTED

## 2018-06-17 LAB — CBC WITH DIFFERENTIAL/PLATELET
Abs Immature Granulocytes: 0.03 10*3/uL (ref 0.00–0.07)
BASOS ABS: 0 10*3/uL (ref 0.0–0.1)
Basophils Relative: 0 %
Eosinophils Absolute: 0.1 10*3/uL (ref 0.0–0.5)
Eosinophils Relative: 1 %
HCT: 42.7 % (ref 39.0–52.0)
Hemoglobin: 12.9 g/dL — ABNORMAL LOW (ref 13.0–17.0)
Immature Granulocytes: 0 %
Lymphocytes Relative: 13 %
Lymphs Abs: 1.2 10*3/uL (ref 0.7–4.0)
MCH: 26.7 pg (ref 26.0–34.0)
MCHC: 30.2 g/dL (ref 30.0–36.0)
MCV: 88.4 fL (ref 80.0–100.0)
Monocytes Absolute: 0.4 10*3/uL (ref 0.1–1.0)
Monocytes Relative: 4 %
NRBC: 0 % (ref 0.0–0.2)
Neutro Abs: 7.5 10*3/uL (ref 1.7–7.7)
Neutrophils Relative %: 82 %
PLATELETS: 142 10*3/uL — AB (ref 150–400)
RBC: 4.83 MIL/uL (ref 4.22–5.81)
RDW: 14.3 % (ref 11.5–15.5)
WBC: 9.3 10*3/uL (ref 4.0–10.5)

## 2018-06-17 LAB — I-STAT CG4 LACTIC ACID, ED
LACTIC ACID, VENOUS: 3.19 mmol/L — AB (ref 0.5–1.9)
Lactic Acid, Venous: 1.26 mmol/L (ref 0.5–1.9)

## 2018-06-17 LAB — COMPREHENSIVE METABOLIC PANEL
ALT: 170 U/L — ABNORMAL HIGH (ref 0–44)
AST: 132 U/L — ABNORMAL HIGH (ref 15–41)
Albumin: 3.1 g/dL — ABNORMAL LOW (ref 3.5–5.0)
Alkaline Phosphatase: 152 U/L — ABNORMAL HIGH (ref 38–126)
Anion gap: 16 — ABNORMAL HIGH (ref 5–15)
BILIRUBIN TOTAL: 1 mg/dL (ref 0.3–1.2)
BUN: 18 mg/dL (ref 6–20)
CO2: 22 mmol/L (ref 22–32)
Calcium: 8.7 mg/dL — ABNORMAL LOW (ref 8.9–10.3)
Chloride: 98 mmol/L (ref 98–111)
Creatinine, Ser: 1.21 mg/dL (ref 0.61–1.24)
GFR calc Af Amer: >60 mL/min (ref >60)
GFR calc non Af Amer: >60 mL/min (ref >60)
Glucose, Bld: 117 mg/dL — ABNORMAL HIGH (ref 70–99)
POTASSIUM: 3.4 mmol/L — AB (ref 3.5–5.1)
Sodium: 136 mmol/L (ref 135–145)
Total Protein: 6.6 g/dL (ref 6.5–8.1)

## 2018-06-17 LAB — HIV ANTIBODY (ROUTINE TESTING W REFLEX): HIV Screen 4th Generation wRfx: NONREACTIVE

## 2018-06-17 LAB — URINALYSIS, ROUTINE W REFLEX MICROSCOPIC
Bilirubin Urine: NEGATIVE
Glucose, UA: NEGATIVE mg/dL
Hgb urine dipstick: NEGATIVE
KETONES UR: NEGATIVE mg/dL
Leukocytes, UA: NEGATIVE
Nitrite: NEGATIVE
Protein, ur: NEGATIVE mg/dL
Specific Gravity, Urine: 1.025 (ref 1.005–1.030)
pH: 5.5 (ref 5.0–8.0)

## 2018-06-17 LAB — TROPONIN I
Troponin I: 0.05 ng/mL (ref <0.03)
Troponin I: 0.04 ng/mL (ref <0.03)
Troponin I: <0.03 ng/mL (ref <0.03)

## 2018-06-17 LAB — CARBOXYHEMOGLOBIN - COOX: Carboxyhemoglobin: 5.1 % (ref 0.5–1.5)

## 2018-06-17 LAB — I-STAT TROPONIN, ED: Troponin i, poc: 0.01 ng/mL (ref 0.00–0.08)

## 2018-06-17 LAB — MAGNESIUM: Magnesium: 1.4 mg/dL — ABNORMAL LOW (ref 1.7–2.4)

## 2018-06-17 MED ORDER — ONDANSETRON HCL 4 MG/2ML IJ SOLN: 4.0000 mg | Freq: Four times a day (QID) | INTRAMUSCULAR | Status: DC | PRN

## 2018-06-17 MED ORDER — SODIUM CHLORIDE 0.9 % IV SOLN
500.0000 mg | Freq: Once | INTRAVENOUS | Status: AC
  Administered 2018-06-17: 500 mg via INTRAVENOUS
  Filled 2018-06-17: qty 500

## 2018-06-17 MED ORDER — SODIUM CHLORIDE 0.9 % IV SOLN
1.0000 g | Freq: Once | INTRAVENOUS | Status: AC
  Administered 2018-06-17: 1 g via INTRAVENOUS
  Filled 2018-06-17: qty 10

## 2018-06-17 MED ORDER — AZITHROMYCIN 500 MG PO TABS
250.0000 mg | ORAL_TABLET | Freq: Every day | ORAL | Status: DC
  Administered 2018-06-18: 250 mg via ORAL
  Filled 2018-06-17: qty 1

## 2018-06-17 MED ORDER — ENOXAPARIN SODIUM 40 MG/0.4ML ~~LOC~~ SOLN
40.0000 mg | Freq: Every day | SUBCUTANEOUS | Status: DC
  Administered 2018-06-17: 40 mg via SUBCUTANEOUS
  Filled 2018-06-17 (×2): qty 0.4

## 2018-06-17 MED ORDER — NALOXONE HCL 0.4 MG/ML IJ SOLN: 0.4000 mg | INTRAMUSCULAR | Status: DC | PRN

## 2018-06-17 MED ORDER — SODIUM CHLORIDE 0.9% FLUSH
3.0000 mL | Freq: Two times a day (BID) | INTRAVENOUS | Status: DC
  Administered 2018-06-17: 3 mL via INTRAVENOUS

## 2018-06-17 MED ORDER — KETOROLAC TROMETHAMINE 30 MG/ML IJ SOLN
30.0000 mg | Freq: Once | INTRAMUSCULAR | Status: AC
  Administered 2018-06-17: 30 mg via INTRAVENOUS
  Filled 2018-06-17: qty 1

## 2018-06-17 MED ORDER — OXYCODONE-ACETAMINOPHEN 5-325 MG PO TABS
1.0000 | ORAL_TABLET | Freq: Four times a day (QID) | ORAL | Status: DC | PRN
  Administered 2018-06-17 – 2018-06-18 (×3): 1 via ORAL
  Filled 2018-06-17 (×3): qty 1

## 2018-06-17 MED ORDER — POTASSIUM CHLORIDE 2 MEQ/ML IV SOLN
INTRAVENOUS | Status: AC
  Administered 2018-06-17: 11:00:00 via INTRAVENOUS
  Filled 2018-06-17 (×2): qty 1000

## 2018-06-17 MED ORDER — ACETAMINOPHEN 650 MG RE SUPP: 650.0000 mg | Freq: Four times a day (QID) | RECTAL | Status: DC | PRN

## 2018-06-17 MED ORDER — SODIUM CHLORIDE 0.9 % IV SOLN
1.0000 g | INTRAVENOUS | Status: DC
  Administered 2018-06-18: 1 g via INTRAVENOUS
  Filled 2018-06-17: qty 10

## 2018-06-17 MED ORDER — POTASSIUM CHLORIDE CRYS ER 20 MEQ PO TBCR
40.0000 meq | EXTENDED_RELEASE_TABLET | Freq: Once | ORAL | Status: AC
  Administered 2018-06-17: 40 meq via ORAL
  Filled 2018-06-17: qty 2

## 2018-06-17 MED ORDER — SODIUM CHLORIDE 0.9 % IV BOLUS
1000.0000 mL | Freq: Once | INTRAVENOUS | Status: AC
  Administered 2018-06-17: 1000 mL via INTRAVENOUS

## 2018-06-17 MED ORDER — ACETAMINOPHEN 325 MG PO TABS: 650.0000 mg | ORAL_TABLET | Freq: Four times a day (QID) | ORAL | Status: DC | PRN

## 2018-06-17 MED ORDER — ONDANSETRON HCL 4 MG PO TABS: 4.0000 mg | ORAL_TABLET | Freq: Four times a day (QID) | ORAL | Status: DC | PRN

## 2018-06-17 MED ORDER — PREDNISONE 5 MG PO TABS
5.0000 mg | ORAL_TABLET | Freq: Every day | ORAL | Status: DC
  Administered 2018-06-17 – 2018-06-18 (×2): 5 mg via ORAL
  Filled 2018-06-17 (×2): qty 1

## 2018-06-17 MED ORDER — MAGNESIUM SULFATE IN D5W 1-5 GM/100ML-% IV SOLN
1.0000 g | Freq: Once | INTRAVENOUS | Status: AC
  Administered 2018-06-17: 1 g via INTRAVENOUS
  Filled 2018-06-17: qty 100

## 2018-06-17 NOTE — H&P (Signed)
Date: 06/17/2018               Patient Name:  [name] [name] MRN: [id number]  DOB: 05-Aug-1960 Age / Sex: 57 y.o., male   PCP: Jean Rosenthal, MD         Medical Service: Internal Medicine Teaching Service         Attending Physician: Dr. Rebeca Alert Raynaldo Opitz, MD    First Contact: Dr. Eileen Stanford Pager: [phone]  Second Contact: Dr. Shan Levans Pager: [phone]       After Hours (After 5p/  First Contact Pager: [phone]  weekends / holidays): Second Contact Pager: [phone]   Chief Complaint: Near syncope  History of Present Illness: 57 year old male with past medical history of HTN, GERD, anemia, RA, who presented with generalized weakness.  He mentions that he has been tired since yesterday and has had some lightheadedness. Denies fever and chills but reports sweating. Also  Denies chest pain or shortness of breath. (He initially reported having some c.p yesterday when talking to ED provider). He had 1 episode pf vomiting the night before. No associated abdominal pain or diarrhea. He endorses some leg cramp without swelling. Mentions that he has had similar cramp when his potassium is low. No leg swelling, no recent travel. Had 1 night hospitalization on Dec 10 th for hand surgery. No sick contact.  On EMS arrival, his blood pressure was 100/60 and temperature documented at 104.2. He has wood stove at home.  ED course:  Febrile, with lactic acidosis at 3.4. X-ray showed opacity at right lower lobe. concerning for PNA. IM consulted for admission.  Meds:  No outpatient medications have been marked as taking for the 06/17/18 encounter [facility] Encounter).     Allergies: Allergies as of 06/17/2018 - Review Complete 05/30/2018  Allergen Reaction Noted  . Morphine and related Rash 08/03/2017  . Ramipril Rash 06/23/2011  . Tramadol Itching and Rash 06/23/2011   Past Medical History:  Diagnosis Date  . Anemia   . DDD (degenerative disc disease), lumbosacral     and grade 2 slip  . GERD  (gastroesophageal reflux disease)   . Heart murmur   . History of anemia    no current med.  . Hypertension    states is borderline on medication; has been on med. x 5-6 yr.  . Impingement syndrome of shoulder region 10/2014   left  . Lupus ([place])   . Pneumonia   . RA (rheumatoid arthritis) ([facility])     Family History:  Heart attack in father   Social History:  Currently smokes 7 to 8 cigarettes daily Denies illicit drug use Denies alcohol use  Review of Systems: A complete ROS was negative except as per HPI.   Physical Exam: Blood pressure 124/81, pulse (!) 101, temperature 98.9 F (37.2 C), temperature source Oral, SpO2 100 %.  Vital signs reviewed, nursing note reviewed General: Pleasant gentleman, lying in the bed in no acute distress but is tachypneic HEENT:  Eyes: PERRLA, extra ocular movement normal CV: Systolic murmur, no JVD Lungs: Decreased breath sounds at bases mostly at right, faint crackle at right lung base  EKG: personally reviewed my interpretation is ST dep at V5-V6 and II, III, AVF.  CXR: personally reviewed my interpretation is right side infiltration  Assessment & Plan by Problem: Active Problems:   Sepsis ([place]) 57 year old male with past medical history of HTN, GERD, anemia, RA, who presented with generalized weakness and found to be febrile, with soft  BP on EMS arrival.   Sepsis:  Patient with weakness, lightheadedness, some body ache and some coughTacypneac, febrile and mildly tachycardiac on arrival with Nl O2 sat with mild tachycardia as well as soft blood pressure.  Somnolent on exam and has faint crackle and decreased breath sounds at bses mostly at right. No leukocytosis. With AG(Lactic)acidosis  CXR (with over penetration) but with visible haziness at right lower base.  Likely 2/2 Pneumonia.  Flu can also be a differential.  In adition, has Hx of right hand surgery 2 weeks ago, right forearm is on cast, patient mentions that it is supposed  to be removed in 3 days. Further evaluation of incision side is recommended to rul out incision site infection. Fingers sensation and movement are intact.  Has wood stove at home, Carboxy Hb at 5 can be due to smoking and not very hight.  -Ceftriaxone  -Azithromycin -Flu test -Narcan -EKG -Trop trend -Tylenol 650 mg PRN -Trend Lactic acid-->: 3.4-->: 1.2 -Respiratory panel PCR -NS 1 li bolus -Cardiac monitoring -CMP -U/A and U/C  Hypokalemia: Has been on Po K at home. (Takes Chlorthalidone) Current K:3.4 -Kdur 40 mEq once -Monitor K -Mg  ST-T changes in Inf and lat leads: -Will repeat EKG and Trend Trop  RA: Has been off of Humira and MTX for 2 months. Is taking Prednion 5 mg QD. With recent right hand surgery . May need to be set with new provider since has been dismissed from previous clinic. Was on Percocet at home for pain.   He looks drowsy, we will avoid Opiod for now -Continue Prednison 5 mg QD  -F/u out patient -Ketorolac 30 mg IV once for hand pain  Transaminitis: Can be 2/2 MTX, Non alcoholic fatty liver. Will monitor -Hep panel   HTN: Has had soft BP at 100/40. On Amlodipin 10 QD and Chlorthalidon 25 mg QD at home -Hold BP meds and monitor BP  Diet: Heart healthy IV fluid: NS VTE ppx: Lovenox Code status: Full  Dispo: Admit patient to Inpatient with expected length of stay greater than 2 midnights.  SignedDewayne Hatch, MD 06/17/2018, 6:21 AM  Pager: [phone]

## 2018-06-17 NOTE — Progress Notes (Signed)
PT Cancellation Note  Patient Details Name: [name] [name] MRN: [id number] DOB: [birth year]   Cancelled Treatment:    Reason Eval/Treat Not Completed: Fatigue/lethargy limiting ability to participate. Pt sleeping on arrival. Able to arouse but pt remained groggy. Requesting to come back later. PT to re-attempt eval as time allows.   Lorriane Shire 06/17/2018, 12:33 PM   Lorrin Goodell, PT  Office # [phone] Pager [phone]

## 2018-06-17 NOTE — Discharge Summary (Addendum)
Name: [name] [name] MRN: [id number] DOB: 1960-12-02 57 y.o. PCP: Jean Rosenthal, MD  Date of Admission: 06/17/2018  3:14 AM Date of Discharge: 06/18/2018 Attending Physician: Oda Kilts, MD  Discharge Diagnosis: 1.  Sepsis secondary to community-acquired pneumonia 2.  Transaminitis secondary to liver hypoperfusion 3.  Troponinemia 4.  Rheumatoid arthritis 5.  Hypertension 6.  Hypokalemia 7.  Hypomagnesemia  Discharge Medications: Allergies as of 06/18/2018      Reactions   Morphine And Related Rash   Ramipril Rash   Per patient on R arm and leg only; no angioedema   Tramadol Itching, Rash      Medication List    STOP taking these medications   Potassium Chloride ER 20 MEQ Tbcr     TAKE these medications   amLODipine 10 MG tablet Commonly known as:  NORVASC TAKE 1 TABLET (10 MG TOTAL) BY MOUTH DAILY.   azithromycin 250 MG tablet Commonly known as:  ZITHROMAX Take 1 tablet (250 mg total) by mouth daily for 5 days.   cefdinir 300 MG capsule Commonly known as:  OMNICEF Take 1 capsule (300 mg total) by mouth 2 (two) times daily for 5 days.   chlorthalidone 25 MG tablet Commonly known as:  HYGROTON TAKE 1 TABLET BY MOUTH EVERY DAY   methocarbamol 500 MG tablet Commonly known as:  ROBAXIN Take 1 tablet (500 mg total) by mouth 3 (three) times daily. What changed:    when to take this  reasons to take this   naloxone 2 MG/2ML injection Commonly known as:  NARCAN Inject 2 mg into the muscle once as needed (opiod overdose (call 911, inject intramuscularly in shoulder or thigh. Repeat every 3 minutes)).   ondansetron 4 MG disintegrating tablet Commonly known as:  ZOFRAN ODT Take 1 tablet (4 mg total) by mouth every 8 (eight) hours as needed for nausea or vomiting.   oxyCODONE-acetaminophen 10-325 MG tablet Commonly known as:  PERCOCET Take 1 tablet by mouth 5 (five) times daily.   potassium chloride SA 20 MEQ tablet Commonly known as:   K-DUR,KLOR-CON Take 2 tablets (40 mEq total) by mouth daily for 3 days.   predniSONE 5 MG tablet Commonly known as:  DELTASONE Take 1 tablet (5 mg total) by mouth daily. What changed:  how much to take       Disposition and follow-up:   KyleTerrill T [name] was discharged from [facility] in [place] condition.  At the hospital follow up visit please address:  1.  Sepsis secondary to community-acquired pneumonia: Please ensure completion of azithromycin and Omnicef.  Persistent hypokalemia: Please consider an alternative to his current diuretic.  Troponinemia: EKG on admission revealed ST depression in inferior lateral leads with troponin values of 0.01>> 0.05>> 0.04>>0.03 due to demand ischemia.  Has some degree of CAD.   Transaminitis due to liver hypoperfusion and/or methotrexate use.  2.  Labs / imaging needed at time of follow-up: CMP, chest x-ray in 4 to 6 weeks.  3.  Pending labs/ test needing follow-up: None  Follow-up Appointments:   Hospital Course by problem list: 1.  Sepsis secondary to community-acquired pneumonia: [name] is a 57 year old pleasant gentleman with rheumatoid arthritis previously on Humira, methotrexate and prednisone, hypertension and GERD who presented with malaise, cough productive of brown/green sputum and lightheadedness.  Initial vital signs were significant for fever of 101, tachycardic to 103.  Labs significant for lactic acidosis of 3.4.  Chest x-ray revealed right lower lobe opacity.  He was fluid resuscitated with IV fluids with resolution of lactic acidosis and tachycardia.  He received IV ceftriaxone and oral azithromycin for 2 days.  He was subsequently discharged on cefdinir 300 mg twice a day and azithromycin 250 mg daily for 5 days to complete a total of 7-day course.  2.  Transaminitis:  Initial labs significant for elevated AST, ALT and alk phos. Hepatitis B and C testing negative, also showed no immunity to hepatitis B.   Repeat labs showed improvement.  This was likely secondary to liver hypoperfusion from sepsis.  Recommend considering hepatitis B vaccination at follow-up as well as further evaluation for underlying liver disease with right upper quadrant ultrasound.  3. Troponinemia: Initial troponin unremarkable however repeat of 0.05.   He did have ST depression in the inferior lateral leads on initial EKG. he remained asymptomatic and denied chest pain, shortness of breath.    At discharge troponin levels were unremarkable.  He most likely has some degree of CAD and will require further work-up.  4. Rheumatoid arthritis: [name] has a longstanding history of RA.  Previously he was on Humira, methotrexate and prednisone.  However for the past 2 months he has been off Humira and methotrexate and only taking prednisone 5 mg daily.  Unfortunately he has been dismissed from his previous rheumatology clinic due to no-shows.  He recently underwent right hand surgery for bone deformity and currently has a cast in place.  Reports the cast comes of on 06/20/2018.  We continued prednisone 5 mg daily  5. Hypertension: His blood pressures remained stable and his home amlodipine and chlorthalidone were held.  6. Electrolyte abnormalities: Hypokalemia: Was repleted and discharged on K.-Dur 20 mEq x 3 days  Hypomagnesemia: Was repleted with IV magnesium 1 g.  Discharge Vitals:   BP 126/88 (BP Location: Left Arm)   Pulse 85   Temp 97.9 F (36.6 C) (Oral)   Resp 20   Ht 5' 6"  (1.676 m)   Wt 83.8 kg   SpO2 96%   BMI 29.82 kg/m   Pertinent Labs, Studies, and Procedures:  CBC Latest Ref Rng & Units 06/18/2018 06/17/2018 04/26/2018  WBC 4.0 - 10.5 K/uL 7.0 9.3 7.8  Hemoglobin 13.0 - 17.0 g/dL 11.6(L) 12.9(L) 13.2  Hematocrit 39.0 - 52.0 % 37.2(L) 42.7 42.0  Platelets 150 - 400 K/uL 123(L) 142(L) 280   CMP Latest Ref Rng & Units 06/18/2018 06/17/2018 05/30/2018  Glucose 70 - 99 mg/dL 108(H) 117(H) 87  BUN 6 - 20  mg/dL 12 18 9   Creatinine 0.61 - 1.24 mg/dL 0.94 1.21 1.02  Sodium 135 - 145 mmol/L 139 136 140  Potassium 3.5 - 5.1 mmol/L 3.4(L) 3.4(L) 3.2(L)  Chloride 98 - 111 mmol/L 104 98 95(L)  CO2 22 - 32 mmol/L 25 22 24   Calcium 8.9 - 10.3 mg/dL 8.7(L) 8.7(L) 9.6  Total Protein 6.5 - 8.1 g/dL 5.9(L) 6.6 -  Total Bilirubin 0.3 - 1.2 mg/dL 0.6 1.0 -  Alkaline Phos 38 - 126 U/L 111 152(H) -  AST 15 - 41 U/L 37 132(H) -  ALT 0 - 44 U/L 87(H) 170(H) -    Discharge Instructions: Discharge Instructions    Call MD for:  difficulty breathing, headache or visual disturbances   Complete by:  As directed    Call MD for:  persistant nausea and vomiting   Complete by:  As directed    Call MD for:  temperature >100.4   Complete by:  As directed  Diet - low sodium heart healthy   Complete by:  As directed    Discharge instructions   Complete by:  As directed    Mr. Garciamartinez,  It was a pleasure taking care of you at the hospital.  You were admitted because of pneumonia.  We gave you IV antibiotics and I am discharging you with 2 oral antibiotics called ceftriaxone and Omnicef.  You take these 2 for an additional 5 days to complete a 7-day course.  Your potassium and magnesium were low.  We gave you oral potassium and IV magnesium.  You will need a repeat chest x-ray in 4 to 6 weeks.  ~Take Care Dr. Eileen Stanford   Increase activity slowly   Complete by:  As directed       Signed: Jean Rosenthal, MD 06/18/2018, 10:32 AM   Pager: [phone] IMTS PGY-1    Internal Medicine Attending Note:  I saw and examined the patient on the day of discharge. I reviewed and agree with the discharge summary written by the house staff.  Lenice Pressman, M.D., Ph.D.

## 2018-06-17 NOTE — ED Triage Notes (Signed)
Pt arrived via [facility]; pt from [facility] with c/o hypotension and CP lasting approx 65mins; pt denies CP currently; Pt rec'd 324mg  ASA; BP 100/60; pt has Hx of HTN; Recent sx to R hand on Fri with splint cast in place; ETCO2 24; CBG 153; 90 % on RA; placed on 2L via [place] and now 96%; 104.2 temp and rec'd 500 NS bolus

## 2018-06-17 NOTE — ED Provider Notes (Signed)
[facility] EMERGENCY DEPARTMENT Provider Note   CSN: [id number] Arrival date & time: 06/17/18  6283     History   Chief Complaint Chief Complaint  Patient presents with  . Hypotension    HPI [name] [name] is a 57 y.o. male.  Patient is a 57 year old male with past medical history of hypertension, rheumatoid arthritis, GERD.  He is brought by EMS for evaluation of low blood pressure and near syncope.  Patient also describes an episode of chest pain this evening that lasted for approximately 20 minutes.  He was given aspirin in route.  I am also told that his temperature was 104.2 upon EMS arrival with blood pressure of 100/60.  Patient complains of feeling lightheaded when he stands and walks.  He denies any productive cough, dysuria, diarrhea or vomiting.  The history is provided by the patient and the EMS personnel.    Past Medical History:  Diagnosis Date  . Anemia   . DDD (degenerative disc disease), lumbosacral     and grade 2 slip  . GERD (gastroesophageal reflux disease)   . Heart murmur   . History of anemia    no current med.  . Hypertension    states is borderline on medication; has been on med. x 5-6 yr.  . Impingement syndrome of shoulder region 10/2014   left  . Lupus ([place])   . Pneumonia   . RA (rheumatoid arthritis) [facility])     Patient Active Problem List   Diagnosis Date Noted  . Hypokalemia 05/30/2018  . S/P lumbar fusion 01/03/2018  . Coronary artery disease involving native coronary artery of native heart with unstable angina pectoris ([place])   . Preventative health care 03/13/2017  . 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 Left shoulder pain 12/16/2015  . Transaminitis 12/16/2015  . Insomnia 06/01/2015  . Rotator cuff tear 11/04/2014  . Bilateral shoulder pain 08/20/2012  . Tobacco use disorder 08/20/2012  . Anemia 08/06/2012  . HTN (hypertension) 04/20/2011  . Low back pain without sciatica 10/29/2009  . HLD (hyperlipidemia)  10/08/2009  . Rheumatoid arthritis ([place]) 08/11/2009    Past Surgical History:  Procedure Laterality Date  . ACHILLES TENDON SURGERY Bilateral   . LEFT HEART CATH AND CORONARY ANGIOGRAPHY N/A 03/15/2017   Procedure: LEFT HEART CATH AND CORONARY ANGIOGRAPHY;  Surgeon: Nelva Bush, MD;  Location: [place] [facility] CV LAB;  Service: Cardiovascular;  Laterality: N/A;  . OSTEOTOMY AND ULNAR SHORTENING Right 07/22/2002  . SHOULDER ARTHROSCOPY WITH BICEPSTENOTOMY Right 03/11/2015   Procedure: SHOULDER ARTHROSCOPY WITH BICEPSTENOTOMY;  Surgeon: Ninetta Lights, MD;  Location: [facility];  Service: Orthopedics;  Laterality: Right;  . SHOULDER ARTHROSCOPY WITH DISTAL CLAVICLE RESECTION Left 11/19/2014   Procedure: SHOULDER ARTHROSCOPY WITH DISTAL CLAVICLE RESECTION;  Surgeon: Kathryne Hitch, MD;  Location: [facility];  Service: Orthopedics;  Laterality: Left;  . SHOULDER ARTHROSCOPY WITH ROTATOR CUFF REPAIR AND SUBACROMIAL DECOMPRESSION Left 11/19/2014   Procedure: LEFT SHOULDER ARTHROSCOPY, DEBRIDEMENT DISTAL CLAVICLE EXCISION, ACROMIOPLASTY WITH ROTATOR CUFF REPAIR ;  Surgeon: Kathryne Hitch, MD;  Location: [facility];  Service: Orthopedics;  Laterality: Left;  . TRANSFORAMINAL LUMBAR INTERBODY FUSION (TLIF) WITH PEDICLE SCREW FIXATION 1 LEVEL N/A 01/03/2018   Procedure: TRANSFORAMINAL LUMBAR INTERBODY FUSION (TLIF) LUMBAR FIVE-SACRAL ONE;  Surgeon: Melina Schools, MD;  Location: [facility];  Service: Orthopedics;  Laterality: N/A;  . VIDEO ASSISTED THORACOSCOPY (VATS)/DECORTICATION Right 11/21/2011   drainage of empyema  Home Medications    Prior to Admission medications   Medication Sig Start Date End Date Taking? Authorizing Provider  amLODipine (NORVASC) 10 MG tablet TAKE 1 TABLET (10 MG TOTAL) BY MOUTH DAILY. 02/18/18   Jean Rosenthal, MD  chlorthalidone (HYGROTON) 25 MG tablet TAKE 1 TABLET BY MOUTH EVERY DAY 12/06/17   Rice, Resa Miner, MD  doxycycline  (VIBRAMYCIN) 100 MG capsule Take 1 capsule (100 mg total) by mouth 2 (two) times daily. 04/26/18   Antonietta Breach, PA-C  HUMIRA PEN 40 MG/0.4ML PNKT Inject 40 mg into the skin every 14 (fourteen) days. Every other Thursdays 04/09/17   [provider]  methocarbamol (ROBAXIN) 500 MG tablet Take 1 tablet (500 mg total) by mouth 3 (three) times daily. 01/03/18   Mayo, Darla Lesches, PA-C  methotrexate (RHEUMATREX) 2.5 MG tablet Take 12.5 mg by mouth every Friday. In the morning. 11/23/17   [provider]  naloxone (NARCAN) 2 MG/2ML injection CALL 911, INJECT THE INTRAMUSCULARLY IN SHOULDER OR THIGH. REPEAT EVERY 3 MINUTES 01/23/17   [provider]  ondansetron (ZOFRAN ODT) 4 MG disintegrating tablet Take 1 tablet (4 mg total) by mouth every 8 (eight) hours as needed for nausea or vomiting. 01/03/18   Mayo, Darla Lesches, PA-C  potassium chloride 20 MEQ TBCR Take 20 mEq by mouth 2 (two) times daily for 2 doses. 12/21/17 01/03/18  Lenore Cordia, MD  potassium chloride SA (K-DUR,KLOR-CON) 20 MEQ tablet Take 2 tablets (40 mEq total) by mouth daily for 5 days. 05/31/18 06/05/18  Rehman, Areeg N, DO  predniSONE (DELTASONE) 5 MG tablet Take 1 tablet (5 mg total) by mouth daily. 05/30/18   Rehman, Areeg N, DO    Family History Family History  Problem Relation Age of Onset  . Heart attack Father 26  . Stroke Neg Hx   . Cancer Neg Hx     Social History Social History   Tobacco Use  . Smoking status: Current Every Day Smoker    Packs/day: 0.00    Years: 20.00    Pack years: 0.00    Types: Cigarettes  . Smokeless tobacco: Never Used  . Tobacco comment: 2-3 per day  Substance Use Topics  . Alcohol use: No    Alcohol/week: 0.0 standard drinks    Comment: sober 1998  . Drug use: No     Allergies   Morphine and related; Ramipril; and Tramadol   Review of Systems Review of Systems  All other systems reviewed and are negative.    Physical Exam Updated Vital  Signs BP 124/81 (BP Location: Right Arm)   Pulse (!) 101   Temp (!) 101.6 F (38.7 C) (Oral)   SpO2 100%   Physical Exam Vitals signs and nursing note reviewed.  Constitutional:      General: He is not in acute distress.    Appearance: He is well-developed. He is not diaphoretic.  HENT:     Head: Normocephalic and atraumatic.  Neck:     Musculoskeletal: Normal range of motion and neck supple. No neck rigidity.  Cardiovascular:     Rate and Rhythm: Normal rate and regular rhythm.     Heart sounds: No murmur. No friction rub.  Pulmonary:     Effort: Pulmonary effort is normal. No respiratory distress.     Breath sounds: Normal breath sounds. No wheezing or rales.  Abdominal:     General: Bowel sounds are normal. There is no distension.     Palpations: Abdomen is  soft.     Tenderness: There is no abdominal tenderness.  Musculoskeletal: Normal range of motion.        General: No swelling or tenderness.     Right lower leg: No edema.     Left lower leg: No edema.  Lymphadenopathy:     Cervical: No cervical adenopathy.  Skin:    General: Skin is warm and dry.  Neurological:     Mental Status: He is alert and oriented to person, place, and time.     Cranial Nerves: No cranial nerve deficit.     Coordination: Coordination normal.      ED Treatments / Results  Labs (all labs ordered are listed, but only abnormal results are displayed) Labs Reviewed  URINE CULTURE  CULTURE, BLOOD (ROUTINE X 2)  CULTURE, BLOOD (ROUTINE X 2)  COMPREHENSIVE METABOLIC PANEL  CBC WITH DIFFERENTIAL/PLATELET  URINALYSIS, ROUTINE W REFLEX MICROSCOPIC  CARBOXYHEMOGLOBIN - COOX  I-STAT TROPONIN, ED  I-STAT CG4 LACTIC ACID, ED    EKG EKG Interpretation  Date/Time:  Monday June 17 2018 03:30:58 EST Ventricular Rate:  106 PR Interval:    QRS Duration: 98 QT Interval:  337 QTC Calculation: 448 R Axis:   94 Text Interpretation:  Sinus tachycardia Borderline right axis deviation  Nonspecific repol abnormality, lateral leads Confirmed by Veryl Speak [phone]) on 06/17/2018 3:42:54 AM   Radiology No results found.  Procedures Procedures (including critical care time)  Medications Ordered in ED Medications  sodium chloride 0.9 % bolus 1,000 mL (has no administration in time range)     Initial Impression / Assessment and Plan / ED Course  I have reviewed the triage vital signs and the nursing notes.  Pertinent labs & imaging results that were available during my care of the patient were reviewed by me and considered in my medical decision making (see chart for details).  Patient presents here with complaints of weakness and fever.  I am told his temperature was 104.2 when EMS initially arrived.  He arrives here febrile with a temp of 101.6.  He describes feeling lightheaded when he ambulates but denies any other specific complaints.  His work-up reveals no elevation of white count, however x-ray of the chest is suggestive of pneumonia.  He will be given Rocephin and Zithromax.  His initial blood pressure was 124/81 and heart rate 101.  Patient given intravenous fluids and will be admitted to the internal medicine teaching service for further care.  Final Clinical Impressions(s) / ED Diagnoses   Final diagnoses:  None    ED Discharge Orders    None       Veryl Speak, MD 06/17/18 [phone]

## 2018-06-17 NOTE — Progress Notes (Signed)
   Subjective: HD #0  Overnight: No acute events reported  Today, [name] [name] was examined at bedside and complaint of diaphoresis.  He denies shortness of breath this morning but still has ongoing cough that is productive for brown sputum as well as right-sided pleuritic chest pain.  Objective:  Vital signs in last 24 hours: Vitals:   06/17/18 0639 06/17/18 0642 06/17/18 0700 06/17/18 1128  BP: 102/69 105/65 110/75 110/75  Pulse: 100 95 94 94  Resp: 18 18 20 20   Temp:  98.5 F (36.9 C) 98.6 F (37 C) 98.6 F (37 C)  TempSrc:  Oral Oral Oral  SpO2: 94% 93% 95%   Weight:  83.8 kg  83.9 kg  Height:    5\' 6"  (1.676 m)   Constitutional: In no acute distress, speaks in full sentences, sitting on bed Cardiovascular: RRR.  No murmurs, gallops, rubs Respiratory: Crackles at the right lower lung base otherwise unremarkable pulmonary exam Skin: Diaphoretic Extremity: Right arm in cast, pulses present, no swelling or erythema.  Assessment/Plan:  Active Problems:   Sepsis [facility])  [name] [name] is a 57 year old pleasant gentleman with rheumatoid arthritis previously on Humira & methotrexate, hypertension and GERD here for management of sepsis secondary to right lower lobe pneumonia.  Sepsis secondary to community-acquired pneumonia: Feeling better this morning.  Denies shortness of breath however still endorses diaphoresis, sputum production and right-sided pleuritic chest pain.  Vital signs without fever, tachycardia, tachypnea or hypotension.  Currently maintaining oxygen saturation above 95% on room air.  CBC without leukocytosis, lactic acidosis has resolved.  Respiratory viral panel unremarkable. - s/p 2.5 L IV fluid - Continue ceftriaxone (day 1) and azithromycin - Follow-up urinalysis, urine culture, blood culture - Continue cardiac monitoring  Transaminitis: Differential diagnosis includes hepatic hypoperfusion from sepsis vs viral hepatitis vs adverse effect from methotrexate.  Portal  vein thrombosis, Wilson disease, autoimmune hepatitis, hemochromatosis, HSV, EBV less likely.  HIV results are unremarkable -Pending hepatitis B surface/core antibody and hepatitis C antibody -Follow-up a.m. CMP  Troponinemia: Initial troponin unremarkable however repeat of 0.05.  He did have ST depression on initial EKG.  It is reassuring that he remains asymptomatic and denies chest pain, shortness of breath.  This could most likely be due to demand ischemia. - Trend troponin - Repeat EKG  Thrombocytopenia: Most likely secondary to methotrexate use.  Per chart review, he had mild thrombocytopenia in 2011. -Continue to monitor.  Carboxyhemoglobinemia: Found to have elevated carboxyhemoglobin of 5.1.  This was thought secondary to longstanding tobacco use.  Rheumatoid arthritis: [name] [name] has a longstanding history of RA.  Previously he was on Humira, methotrexate and prednisone.  However for the past 2 months he has been off Humira and methotrexate and only taking prednisone 5 mg daily.  Unfortunately he has been dismissed from his previous rheumatology clinic due to no-shows.  He recently underwent right hand surgery for bone deformity and currently has a cast in place.  Reports the cast comes of on 06/20/2018. - Continue prednisone 5 mg daily  Hypertension: BP stable. -Continue to hold home amlodipine 10 mg daily and chlorthalidone 25 mg daily  Electrolyte abnormalities: Hypokalemia: K+ of 3.4. s/p 40 meq K-Dur Hypomagnesemia: Mg of 1.4. s/p 1gm IV magnesium  Dispo: Anticipated discharge in approximately 1-2 day(s).   Jean Rosenthal, MD 06/17/2018, 2:12 PM Pager: [phone] IMTS PGY-1

## 2018-06-17 NOTE — Progress Notes (Signed)
Reported Panic Values of Co-ox to MD Veryl Speak. Carboxy 5.1. RN aware.

## 2018-06-18 LAB — CBC
HCT: 37.2 % — ABNORMAL LOW (ref 39.0–52.0)
Hemoglobin: 11.6 g/dL — ABNORMAL LOW (ref 13.0–17.0)
MCH: 26.7 pg (ref 26.0–34.0)
MCHC: 31.2 g/dL (ref 30.0–36.0)
MCV: 85.7 fL (ref 80.0–100.0)
NRBC: 0 % (ref 0.0–0.2)
Platelets: 123 10*3/uL — ABNORMAL LOW (ref 150–400)
RBC: 4.34 MIL/uL (ref 4.22–5.81)
RDW: 14.2 % (ref 11.5–15.5)
WBC: 7 10*3/uL (ref 4.0–10.5)

## 2018-06-18 LAB — COMPREHENSIVE METABOLIC PANEL
ALT: 87 U/L — ABNORMAL HIGH (ref 0–44)
AST: 37 U/L (ref 15–41)
Albumin: 2.7 g/dL — ABNORMAL LOW (ref 3.5–5.0)
Alkaline Phosphatase: 111 U/L (ref 38–126)
Anion gap: 10 (ref 5–15)
BUN: 12 mg/dL (ref 6–20)
CO2: 25 mmol/L (ref 22–32)
Calcium: 8.7 mg/dL — ABNORMAL LOW (ref 8.9–10.3)
Chloride: 104 mmol/L (ref 98–111)
Creatinine, Ser: 0.94 mg/dL (ref 0.61–1.24)
GFR calc Af Amer: >60 mL/min (ref >60)
GFR calc non Af Amer: >60 mL/min (ref >60)
Glucose, Bld: 108 mg/dL — ABNORMAL HIGH (ref 70–99)
Potassium: 3.4 mmol/L — ABNORMAL LOW (ref 3.5–5.1)
Sodium: 139 mmol/L (ref 135–145)
Total Bilirubin: 0.6 mg/dL (ref 0.3–1.2)
Total Protein: 5.9 g/dL — ABNORMAL LOW (ref 6.5–8.1)

## 2018-06-18 LAB — HEPATITIS C ANTIBODY (REFLEX): HCV Ab: <0.1 s/co ratio (ref 0.0–0.9)

## 2018-06-18 LAB — HEPATITIS B CORE ANTIBODY, TOTAL: Hep B Core Total Ab: NEGATIVE

## 2018-06-18 LAB — HCV COMMENT:

## 2018-06-18 LAB — URINE CULTURE
Culture: <10000 — AB
Special Requests: NORMAL

## 2018-06-18 LAB — HEPATITIS B SURFACE ANTIBODY, QUANTITATIVE: Hep B S AB Quant (Post): <3.1 m[IU]/mL — ABNORMAL LOW (ref >9.9)

## 2018-06-18 MED ORDER — AZITHROMYCIN 250 MG PO TABS: 250.0000 mg | ORAL_TABLET | Freq: Every day | ORAL | 0 refills | Status: AC

## 2018-06-18 MED ORDER — DM-GUAIFENESIN ER 30-600 MG PO TB12
1.0000 | ORAL_TABLET | Freq: Once | ORAL | Status: AC
  Administered 2018-06-18: 1 via ORAL
  Filled 2018-06-18: qty 1

## 2018-06-18 MED ORDER — POTASSIUM CHLORIDE CRYS ER 20 MEQ PO TBCR
40.0000 meq | EXTENDED_RELEASE_TABLET | Freq: Once | ORAL | Status: AC
  Administered 2018-06-18: 40 meq via ORAL
  Filled 2018-06-18: qty 2

## 2018-06-18 MED ORDER — POTASSIUM CHLORIDE CRYS ER 20 MEQ PO TBCR: 40.0000 meq | EXTENDED_RELEASE_TABLET | Freq: Every day | ORAL | 0 refills | Status: DC

## 2018-06-18 MED ORDER — CEFDINIR 300 MG PO CAPS: 300.0000 mg | ORAL_CAPSULE | Freq: Two times a day (BID) | ORAL | 0 refills | Status: AC

## 2018-06-18 NOTE — Progress Notes (Signed)
PT Cancellation Note  Patient Details Name: [name] [name] MRN: [id number] DOB: February 14, 1961   Cancelled Treatment:    Reason Eval/Treat Not Completed: PT screened, no needs identified, will sign off. Met pt at the door, as he was waiting for volunteer services to wheel him off the unit for d/c. Pt reports he is at baseline of function and does not wish for a PT evaluation at this time. Noted pt ambulated independently with RN earlier with O2 sats remaining WNL on RA. Will sign off at this time per pt request. If needs change, please reconsult.    Thelma Comp 06/18/2018, 12:16 PM   Rolinda Roan, PT, DPT Acute Rehabilitation Services Pager: [phone] Office: [phone]

## 2018-06-18 NOTE — Progress Notes (Signed)
SATURATION QUALIFICATIONS:  Patient Saturations on Room Air at Rest = 98%  Patient Saturations on Room Air while Ambulating = 94%  Patient ambulated 100 ft. Independently. Tolerated well. Dorthey Sawyer, RN

## 2018-06-18 NOTE — Progress Notes (Signed)
Patient discharged to home. After visit summary reviewed and patient capable of re verbalizing medications and follow up appointments. Pt remains stable. No signs and symptoms of distress. Educated to return to ER in the event of SOB, dizziness, chest pain, or fainting. Koray Soter, RN  

## 2018-06-18 NOTE — Progress Notes (Signed)
   Subjective: HD#1  Overnight: No acute events reported  Today, [name] [name] reports he is doing really well.  He denies shortness of breath, fevers or chills.  He still has ongoing cough however not as severe.  He endorses " chest congestion."  Objective:  Vital signs in last 24 hours: Vitals:   06/17/18 0700 06/17/18 1128 06/17/18 2030 06/18/18 0550  BP: 110/75 110/75 111/65 (!) 138/91  Pulse: 94 94 80 92  Resp: 20 20 18 19   Temp: 98.6 F (37 C) 98.6 F (37 C) 98.8 F (37.1 C) 98.6 F (37 C)  TempSrc: Oral Oral Oral Oral  SpO2: 95%  95% 100%  Weight:  83.9 kg 83.8 kg   Height:  5\' 6"  (1.676 m)     Constitutional: In no acute distress, speaks in full sentences Cardiovascular: RRR, no murmurs, gallops, rubs. Respiratory: Right lower lobe crackles otherwise clear to auscultation bilaterally.  Assessment/Plan:  Principal Problem:   Community acquired pneumonia Active Problems:   Rheumatoid arthritis ([place])   Transaminitis   Hypokalemia   Sepsis [facility])   Mr. Bradburn is a 57 year old pleasant gentleman with rheumatoid arthritis previously on Humira & methotrexate, hypertension and GERD here for management of sepsis secondary to right lower lobe pneumonia.  Sepsis secondary to community-acquired pneumonia: Clinically improved.  Continues to deny shortness of breath, fevers, chills.  Today he endorsed chest congestion and stable cough.  Has remained afebrile and oxygen saturation has been above 95% on room air.  Labs without leukocytosis.  Blood culture NGTD - Continue ceftriaxone (day 2) and azithromycin. - Obtaining ambulatory pulse ox today, if reassuring will most likely be discharged on Omnicef and azithromycin to complete a 7-day course. - Follow-up urinalysis, urine culture - Continue cardiac monitoring  Transaminitis 2/2 hypoperfusion:  Improving.  Hep C antibody negative, hepatitis B core and surface antibodies negative.  Troponinemia: Resolved.  This was secondary to  demand ischemia.  I presume he has some form of coronary artery disease and will require outpatient work-up.  Thrombocytopenia:  Platelet count on admission 142.  This a.m. measured at 123 most likely due to hemodilution vs medication adverse effect  Rheumatoid arthritis: Previously he was on Humira, methotrexate and prednisone.  Has been off Humira and methotrexate and only taking prednisone 5 mg daily.  Unfortunately he has been dismissed from his previous rheumatology clinic due to no-shows.  He recently underwent right hand surgery for bone deformity and currently has a cast in place.  Reports the cast comes of on 06/20/2018. - Continue prednisone 5 mg daily  Hypertension: BP stable in the 130s/90s. -Continue to hold home amlodipine 10 mg daily and chlorthalidone 25 mg daily  Electrolyte abnormalities: Hypokalemia: Will replete Hypomagnesemia: s/p IV magnesium 1gm   FEN: Replace electrolytes as needed, heart healthy diet VTE ppx: Subcutaneous Lovenox CODE STATUS: Full code  Dispo: Anticipated discharge in approximately 1 day(s).   Jean Rosenthal, MD 06/18/2018, 6:31 AM Pager: [phone] IMTS PGY-1

## 2018-06-20 DIAGNOSIS — M25641 Stiffness of right hand, not elsewhere classified: Secondary | ICD-10-CM | POA: Diagnosis not present

## 2018-06-20 DIAGNOSIS — M06841 Other specified rheumatoid arthritis, right hand: Secondary | ICD-10-CM | POA: Diagnosis not present

## 2018-06-22 LAB — CULTURE, BLOOD (ROUTINE X 2)
Culture: NO GROWTH
Culture: NO GROWTH
Special Requests: ADEQUATE
Special Requests: ADEQUATE

## 2018-07-07 ENCOUNTER — Other Ambulatory Visit: Payer: Self-pay | Admitting: Internal Medicine

## 2018-07-07 DIAGNOSIS — I10 Essential (primary) hypertension: Secondary | ICD-10-CM

## 2018-07-09 ENCOUNTER — Other Ambulatory Visit: Payer: Self-pay | Admitting: Internal Medicine

## 2018-07-09 DIAGNOSIS — E876 Hypokalemia: Secondary | ICD-10-CM

## 2018-07-09 NOTE — Progress Notes (Signed)
I received a refill request for chlorthalidone for my patient.  Upon review of his labs he was noted to be hypokalemic with K+ of 3.4 in December.  He was instructed to take potassium chloride 40 mEq for 5 days.  I tried reaching out to patient to come to clinic for BMP but was unable to reach him.  It looks like his cell phone is disconnected.

## 2018-07-17 ENCOUNTER — Ambulatory Visit: Payer: Medicare Other

## 2018-07-17 ENCOUNTER — Encounter: Payer: Self-pay | Admitting: Internal Medicine

## 2018-08-01 DIAGNOSIS — M5137 Other intervertebral disc degeneration, lumbosacral region: Secondary | ICD-10-CM | POA: Diagnosis not present

## 2018-08-01 DIAGNOSIS — Z79891 Long term (current) use of opiate analgesic: Secondary | ICD-10-CM | POA: Diagnosis not present

## 2018-08-01 DIAGNOSIS — M069 Rheumatoid arthritis, unspecified: Secondary | ICD-10-CM | POA: Diagnosis not present

## 2018-08-01 DIAGNOSIS — Z79899 Other long term (current) drug therapy: Secondary | ICD-10-CM | POA: Diagnosis not present

## 2018-08-01 DIAGNOSIS — M47817 Spondylosis without myelopathy or radiculopathy, lumbosacral region: Secondary | ICD-10-CM | POA: Diagnosis not present

## 2018-08-01 DIAGNOSIS — M7060 Trochanteric bursitis, unspecified hip: Secondary | ICD-10-CM | POA: Diagnosis not present

## 2018-08-01 DIAGNOSIS — G894 Chronic pain syndrome: Secondary | ICD-10-CM | POA: Diagnosis not present

## 2018-08-14 DIAGNOSIS — H0102A Squamous blepharitis right eye, upper and lower eyelids: Secondary | ICD-10-CM | POA: Diagnosis not present

## 2018-08-14 DIAGNOSIS — H0102B Squamous blepharitis left eye, upper and lower eyelids: Secondary | ICD-10-CM | POA: Diagnosis not present

## 2018-08-14 DIAGNOSIS — H2513 Age-related nuclear cataract, bilateral: Secondary | ICD-10-CM | POA: Diagnosis not present

## 2018-08-14 DIAGNOSIS — H04123 Dry eye syndrome of bilateral lacrimal glands: Secondary | ICD-10-CM | POA: Diagnosis not present

## 2018-08-14 DIAGNOSIS — H179 Unspecified corneal scar and opacity: Secondary | ICD-10-CM | POA: Diagnosis not present

## 2018-08-29 DIAGNOSIS — M069 Rheumatoid arthritis, unspecified: Secondary | ICD-10-CM | POA: Diagnosis not present

## 2018-08-29 DIAGNOSIS — G894 Chronic pain syndrome: Secondary | ICD-10-CM | POA: Diagnosis not present

## 2018-08-29 DIAGNOSIS — M7060 Trochanteric bursitis, unspecified hip: Secondary | ICD-10-CM | POA: Diagnosis not present

## 2018-08-29 DIAGNOSIS — Z79891 Long term (current) use of opiate analgesic: Secondary | ICD-10-CM | POA: Diagnosis not present

## 2018-08-29 DIAGNOSIS — Z79899 Other long term (current) drug therapy: Secondary | ICD-10-CM | POA: Diagnosis not present

## 2018-08-29 DIAGNOSIS — M5137 Other intervertebral disc degeneration, lumbosacral region: Secondary | ICD-10-CM | POA: Diagnosis not present

## 2018-08-29 DIAGNOSIS — M47817 Spondylosis without myelopathy or radiculopathy, lumbosacral region: Secondary | ICD-10-CM | POA: Diagnosis not present

## 2018-09-09 ENCOUNTER — Other Ambulatory Visit: Payer: Self-pay | Admitting: Internal Medicine

## 2018-09-09 ENCOUNTER — Encounter: Payer: Medicare Other | Admitting: Internal Medicine

## 2018-09-09 DIAGNOSIS — M0579 Rheumatoid arthritis with rheumatoid factor of multiple sites without organ or systems involvement: Secondary | ICD-10-CM

## 2018-09-26 DIAGNOSIS — M7060 Trochanteric bursitis, unspecified hip: Secondary | ICD-10-CM | POA: Diagnosis not present

## 2018-09-26 DIAGNOSIS — G894 Chronic pain syndrome: Secondary | ICD-10-CM | POA: Diagnosis not present

## 2018-09-26 DIAGNOSIS — M069 Rheumatoid arthritis, unspecified: Secondary | ICD-10-CM | POA: Diagnosis not present

## 2018-09-26 DIAGNOSIS — M5137 Other intervertebral disc degeneration, lumbosacral region: Secondary | ICD-10-CM | POA: Diagnosis not present

## 2018-10-11 ENCOUNTER — Other Ambulatory Visit: Payer: Self-pay | Admitting: Internal Medicine

## 2018-10-11 DIAGNOSIS — M0579 Rheumatoid arthritis with rheumatoid factor of multiple sites without organ or systems involvement: Secondary | ICD-10-CM

## 2018-10-24 DIAGNOSIS — M47817 Spondylosis without myelopathy or radiculopathy, lumbosacral region: Secondary | ICD-10-CM | POA: Diagnosis not present

## 2018-10-24 DIAGNOSIS — M069 Rheumatoid arthritis, unspecified: Secondary | ICD-10-CM | POA: Diagnosis not present

## 2018-10-24 DIAGNOSIS — M7060 Trochanteric bursitis, unspecified hip: Secondary | ICD-10-CM | POA: Diagnosis not present

## 2018-10-24 DIAGNOSIS — G894 Chronic pain syndrome: Secondary | ICD-10-CM | POA: Diagnosis not present

## 2018-11-12 ENCOUNTER — Other Ambulatory Visit: Payer: Self-pay | Admitting: Internal Medicine

## 2018-11-12 DIAGNOSIS — M0579 Rheumatoid arthritis with rheumatoid factor of multiple sites without organ or systems involvement: Secondary | ICD-10-CM

## 2018-11-20 ENCOUNTER — Encounter: Payer: Medicare Other | Admitting: Internal Medicine

## 2018-11-21 DIAGNOSIS — M7060 Trochanteric bursitis, unspecified hip: Secondary | ICD-10-CM | POA: Diagnosis not present

## 2018-11-21 DIAGNOSIS — M069 Rheumatoid arthritis, unspecified: Secondary | ICD-10-CM | POA: Diagnosis not present

## 2018-11-21 DIAGNOSIS — G894 Chronic pain syndrome: Secondary | ICD-10-CM | POA: Diagnosis not present

## 2018-11-21 DIAGNOSIS — M47817 Spondylosis without myelopathy or radiculopathy, lumbosacral region: Secondary | ICD-10-CM | POA: Diagnosis not present

## 2018-12-01 ENCOUNTER — Other Ambulatory Visit: Payer: Self-pay | Admitting: Internal Medicine

## 2018-12-01 DIAGNOSIS — M0579 Rheumatoid arthritis with rheumatoid factor of multiple sites without organ or systems involvement: Secondary | ICD-10-CM

## 2018-12-02 NOTE — Telephone Encounter (Signed)
Next appt scheduled for 6/18

## 2018-12-04 DIAGNOSIS — M47817 Spondylosis without myelopathy or radiculopathy, lumbosacral region: Secondary | ICD-10-CM | POA: Diagnosis not present

## 2018-12-04 DIAGNOSIS — M5136 Other intervertebral disc degeneration, lumbar region: Secondary | ICD-10-CM | POA: Diagnosis not present

## 2018-12-09 ENCOUNTER — Other Ambulatory Visit: Payer: Self-pay | Admitting: Internal Medicine

## 2018-12-09 DIAGNOSIS — I10 Essential (primary) hypertension: Secondary | ICD-10-CM

## 2018-12-12 ENCOUNTER — Encounter: Payer: Medicare Other | Admitting: Internal Medicine

## 2018-12-19 DIAGNOSIS — M069 Rheumatoid arthritis, unspecified: Secondary | ICD-10-CM | POA: Diagnosis not present

## 2018-12-19 DIAGNOSIS — M47817 Spondylosis without myelopathy or radiculopathy, lumbosacral region: Secondary | ICD-10-CM | POA: Diagnosis not present

## 2018-12-19 DIAGNOSIS — G894 Chronic pain syndrome: Secondary | ICD-10-CM | POA: Diagnosis not present

## 2018-12-19 DIAGNOSIS — Z79899 Other long term (current) drug therapy: Secondary | ICD-10-CM | POA: Diagnosis not present

## 2018-12-19 DIAGNOSIS — M5137 Other intervertebral disc degeneration, lumbosacral region: Secondary | ICD-10-CM | POA: Diagnosis not present

## 2018-12-19 DIAGNOSIS — Z79891 Long term (current) use of opiate analgesic: Secondary | ICD-10-CM | POA: Diagnosis not present

## 2018-12-25 ENCOUNTER — Telehealth: Payer: Self-pay | Admitting: *Deleted

## 2018-12-25 NOTE — Addendum Note (Signed)
Addended by: Truddie Crumble on: 12/25/2018 03:24 PM   Modules accepted: Orders

## 2018-12-26 NOTE — Telephone Encounter (Signed)
I have tried his listed family member and no answer when called

## 2018-12-26 NOTE — Telephone Encounter (Signed)
I have called pt 7/1 and 7/2, goes straight to vmail and does not take a message

## 2019-01-01 DIAGNOSIS — M5136 Other intervertebral disc degeneration, lumbar region: Secondary | ICD-10-CM | POA: Diagnosis not present

## 2019-01-01 DIAGNOSIS — M47817 Spondylosis without myelopathy or radiculopathy, lumbosacral region: Secondary | ICD-10-CM | POA: Diagnosis not present

## 2019-01-14 ENCOUNTER — Ambulatory Visit
Admission: RE | Admit: 2019-01-14 | Discharge: 2019-01-14 | Disposition: A | Discharge status: Home / Self Care | Payer: Medicare Other | Source: Ambulatory Visit | Attending: Pain Medicine | Admitting: Pain Medicine

## 2019-01-14 ENCOUNTER — Other Ambulatory Visit: Payer: Self-pay | Admitting: Pain Medicine

## 2019-01-14 ENCOUNTER — Other Ambulatory Visit: Payer: Self-pay

## 2019-01-14 DIAGNOSIS — M25551 Pain in right hip: Secondary | ICD-10-CM | POA: Diagnosis not present

## 2019-01-14 DIAGNOSIS — M25552 Pain in left hip: Secondary | ICD-10-CM

## 2019-01-15 DIAGNOSIS — M47817 Spondylosis without myelopathy or radiculopathy, lumbosacral region: Secondary | ICD-10-CM | POA: Diagnosis not present

## 2019-01-15 DIAGNOSIS — G894 Chronic pain syndrome: Secondary | ICD-10-CM | POA: Diagnosis not present

## 2019-01-15 DIAGNOSIS — M5136 Other intervertebral disc degeneration, lumbar region: Secondary | ICD-10-CM | POA: Diagnosis not present

## 2019-01-15 DIAGNOSIS — M25551 Pain in right hip: Secondary | ICD-10-CM | POA: Diagnosis not present

## 2019-01-30 DIAGNOSIS — M47817 Spondylosis without myelopathy or radiculopathy, lumbosacral region: Secondary | ICD-10-CM | POA: Diagnosis not present

## 2019-01-30 DIAGNOSIS — M5136 Other intervertebral disc degeneration, lumbar region: Secondary | ICD-10-CM | POA: Diagnosis not present

## 2019-02-05 ENCOUNTER — Encounter: Payer: Medicare Other | Admitting: Internal Medicine

## 2019-02-07 DIAGNOSIS — M545 Low back pain: Secondary | ICD-10-CM | POA: Diagnosis not present

## 2019-02-12 ENCOUNTER — Encounter: Payer: Medicare Other | Admitting: Internal Medicine

## 2019-02-12 DIAGNOSIS — H35373 Puckering of macula, bilateral: Secondary | ICD-10-CM | POA: Diagnosis not present

## 2019-02-12 DIAGNOSIS — H0102A Squamous blepharitis right eye, upper and lower eyelids: Secondary | ICD-10-CM | POA: Diagnosis not present

## 2019-02-12 DIAGNOSIS — H04123 Dry eye syndrome of bilateral lacrimal glands: Secondary | ICD-10-CM | POA: Diagnosis not present

## 2019-02-12 DIAGNOSIS — H2513 Age-related nuclear cataract, bilateral: Secondary | ICD-10-CM | POA: Diagnosis not present

## 2019-02-12 DIAGNOSIS — H179 Unspecified corneal scar and opacity: Secondary | ICD-10-CM | POA: Diagnosis not present

## 2019-02-19 ENCOUNTER — Inpatient Hospital Stay (HOSPITAL_COMMUNITY)
Admission: AD | Admit: 2019-02-19 | Discharge: 2019-02-27 | DRG: 254 | Disposition: A | Discharge status: Home / Self Care | Payer: Medicare Other | Source: Ambulatory Visit | Attending: Student in an Organized Health Care Education/Training Program | Admitting: Student in an Organized Health Care Education/Training Program

## 2019-02-19 ENCOUNTER — Ambulatory Visit (INDEPENDENT_AMBULATORY_CARE_PROVIDER_SITE_OTHER): Payer: Medicare Other | Admitting: Internal Medicine

## 2019-02-19 ENCOUNTER — Encounter: Payer: Self-pay | Admitting: Internal Medicine

## 2019-02-19 ENCOUNTER — Encounter (HOSPITAL_COMMUNITY): Payer: Self-pay | Admitting: Internal Medicine

## 2019-02-19 ENCOUNTER — Other Ambulatory Visit: Payer: Self-pay

## 2019-02-19 VITALS — BP 134/94 | HR 90 | Temp 97.9°F | Ht 66.0 in | Wt 179.6 lb

## 2019-02-19 DIAGNOSIS — K219 Gastro-esophageal reflux disease without esophagitis: Secondary | ICD-10-CM | POA: Diagnosis not present

## 2019-02-19 DIAGNOSIS — G8929 Other chronic pain: Secondary | ICD-10-CM | POA: Diagnosis not present

## 2019-02-19 DIAGNOSIS — Z8249 Family history of ischemic heart disease and other diseases of the circulatory system: Secondary | ICD-10-CM

## 2019-02-19 DIAGNOSIS — D649 Anemia, unspecified: Secondary | ICD-10-CM | POA: Diagnosis not present

## 2019-02-19 DIAGNOSIS — L97529 Non-pressure chronic ulcer of other part of left foot with unspecified severity: Secondary | ICD-10-CM | POA: Diagnosis not present

## 2019-02-19 DIAGNOSIS — Z7952 Long term (current) use of systemic steroids: Secondary | ICD-10-CM | POA: Diagnosis not present

## 2019-02-19 DIAGNOSIS — I998 Other disorder of circulatory system: Secondary | ICD-10-CM

## 2019-02-19 DIAGNOSIS — I739 Peripheral vascular disease, unspecified: Secondary | ICD-10-CM

## 2019-02-19 DIAGNOSIS — F17210 Nicotine dependence, cigarettes, uncomplicated: Secondary | ICD-10-CM | POA: Diagnosis present

## 2019-02-19 DIAGNOSIS — Z79899 Other long term (current) drug therapy: Secondary | ICD-10-CM

## 2019-02-19 DIAGNOSIS — Z720 Tobacco use: Secondary | ICD-10-CM

## 2019-02-19 DIAGNOSIS — I10 Essential (primary) hypertension: Secondary | ICD-10-CM

## 2019-02-19 DIAGNOSIS — Z01810 Encounter for preprocedural cardiovascular examination: Secondary | ICD-10-CM | POA: Diagnosis not present

## 2019-02-19 DIAGNOSIS — Z888 Allergy status to other drugs, medicaments and biological substances status: Secondary | ICD-10-CM

## 2019-02-19 DIAGNOSIS — I25110 Atherosclerotic heart disease of native coronary artery with unstable angina pectoris: Secondary | ICD-10-CM | POA: Diagnosis not present

## 2019-02-19 DIAGNOSIS — Z885 Allergy status to narcotic agent status: Secondary | ICD-10-CM | POA: Diagnosis not present

## 2019-02-19 DIAGNOSIS — M0579 Rheumatoid arthritis with rheumatoid factor of multiple sites without organ or systems involvement: Secondary | ICD-10-CM

## 2019-02-19 DIAGNOSIS — M069 Rheumatoid arthritis, unspecified: Secondary | ICD-10-CM

## 2019-02-19 DIAGNOSIS — E876 Hypokalemia: Secondary | ICD-10-CM | POA: Diagnosis present

## 2019-02-19 DIAGNOSIS — I2510 Atherosclerotic heart disease of native coronary artery without angina pectoris: Secondary | ICD-10-CM | POA: Diagnosis present

## 2019-02-19 DIAGNOSIS — Z7982 Long term (current) use of aspirin: Secondary | ICD-10-CM | POA: Diagnosis not present

## 2019-02-19 DIAGNOSIS — I351 Nonrheumatic aortic (valve) insufficiency: Secondary | ICD-10-CM | POA: Diagnosis present

## 2019-02-19 DIAGNOSIS — Z79891 Long term (current) use of opiate analgesic: Secondary | ICD-10-CM | POA: Diagnosis not present

## 2019-02-19 DIAGNOSIS — I340 Nonrheumatic mitral (valve) insufficiency: Secondary | ICD-10-CM | POA: Diagnosis not present

## 2019-02-19 DIAGNOSIS — I70245 Atherosclerosis of native arteries of left leg with ulceration of other part of foot: Secondary | ICD-10-CM

## 2019-02-19 DIAGNOSIS — I70229 Atherosclerosis of native arteries of extremities with rest pain, unspecified extremity: Secondary | ICD-10-CM | POA: Insufficient documentation

## 2019-02-19 DIAGNOSIS — Z981 Arthrodesis status: Secondary | ICD-10-CM

## 2019-02-19 DIAGNOSIS — Z20828 Contact with and (suspected) exposure to other viral communicable diseases: Secondary | ICD-10-CM | POA: Diagnosis present

## 2019-02-19 DIAGNOSIS — M5137 Other intervertebral disc degeneration, lumbosacral region: Secondary | ICD-10-CM | POA: Diagnosis present

## 2019-02-19 DIAGNOSIS — M79675 Pain in left toe(s): Secondary | ICD-10-CM | POA: Diagnosis present

## 2019-02-19 DIAGNOSIS — Z9862 Peripheral vascular angioplasty status: Secondary | ICD-10-CM | POA: Diagnosis not present

## 2019-02-19 DIAGNOSIS — E785 Hyperlipidemia, unspecified: Secondary | ICD-10-CM | POA: Diagnosis not present

## 2019-02-19 HISTORY — DX: Peripheral vascular disease, unspecified: I73.9

## 2019-02-19 HISTORY — DX: Atherosclerosis of native arteries of extremities with rest pain, unspecified extremity: I70.229

## 2019-02-19 LAB — CBC WITH DIFFERENTIAL/PLATELET
Abs Immature Granulocytes: 0.02 10*3/uL (ref 0.00–0.07)
Basophils Absolute: 0.1 10*3/uL (ref 0.0–0.1)
Basophils Relative: 1 %
Eosinophils Absolute: 0.1 10*3/uL (ref 0.0–0.5)
Eosinophils Relative: 2 %
HCT: 42.5 % (ref 39.0–52.0)
Hemoglobin: 14.3 g/dL (ref 13.0–17.0)
Immature Granulocytes: 0 %
Lymphocytes Relative: 21 %
Lymphs Abs: 1.6 10*3/uL (ref 0.7–4.0)
MCH: 29.5 pg (ref 26.0–34.0)
MCHC: 33.6 g/dL (ref 30.0–36.0)
MCV: 87.6 fL (ref 80.0–100.0)
Monocytes Absolute: 0.4 10*3/uL (ref 0.1–1.0)
Monocytes Relative: 4 %
Neutro Abs: 5.8 10*3/uL (ref 1.7–7.7)
Neutrophils Relative %: 72 %
Platelets: 154 10*3/uL (ref 150–400)
RBC: 4.85 MIL/uL (ref 4.22–5.81)
RDW: 15.2 % (ref 11.5–15.5)
WBC: 8 10*3/uL (ref 4.0–10.5)
nRBC: 0 % (ref 0.0–0.2)

## 2019-02-19 LAB — COMPREHENSIVE METABOLIC PANEL
ALT: 27 U/L (ref 0–44)
AST: 17 U/L (ref 15–41)
Albumin: 3.3 g/dL — ABNORMAL LOW (ref 3.5–5.0)
Alkaline Phosphatase: 83 U/L (ref 38–126)
Anion gap: 10 (ref 5–15)
BUN: 11 mg/dL (ref 6–20)
CO2: 27 mmol/L (ref 22–32)
Calcium: 9.1 mg/dL (ref 8.9–10.3)
Chloride: 103 mmol/L (ref 98–111)
Creatinine, Ser: 0.85 mg/dL (ref 0.61–1.24)
GFR calc Af Amer: >60 mL/min (ref >60)
GFR calc non Af Amer: >60 mL/min (ref >60)
Glucose, Bld: 106 mg/dL — ABNORMAL HIGH (ref 70–99)
Potassium: 2.9 mmol/L — ABNORMAL LOW (ref 3.5–5.1)
Sodium: 140 mmol/L (ref 135–145)
Total Bilirubin: 0.8 mg/dL (ref 0.3–1.2)
Total Protein: 6.8 g/dL (ref 6.5–8.1)

## 2019-02-19 LAB — PROTIME-INR
INR: 1 (ref 0.8–1.2)
Prothrombin Time: 13.2 seconds (ref 11.4–15.2)

## 2019-02-19 LAB — MAGNESIUM: Magnesium: 2 mg/dL (ref 1.7–2.4)

## 2019-02-19 MED ORDER — NICOTINE 21 MG/24HR TD PT24
21.0000 mg | MEDICATED_PATCH | Freq: Every day | TRANSDERMAL | Status: DC
  Administered 2019-02-19 – 2019-02-27 (×8): 21 mg via TRANSDERMAL
  Filled 2019-02-19 (×8): qty 1

## 2019-02-19 MED ORDER — CHLORTHALIDONE 25 MG PO TABS
25.0000 mg | ORAL_TABLET | Freq: Every day | ORAL | Status: DC
  Administered 2019-02-21 – 2019-02-27 (×7): 25 mg via ORAL
  Filled 2019-02-19 (×8): qty 1

## 2019-02-19 MED ORDER — PREDNISONE 5 MG PO TABS
5.0000 mg | ORAL_TABLET | Freq: Every day | ORAL | Status: DC
  Administered 2019-02-21 – 2019-02-27 (×7): 5 mg via ORAL
  Filled 2019-02-19 (×7): qty 1

## 2019-02-19 MED ORDER — HEPARIN BOLUS VIA INFUSION
4000.0000 [IU] | Freq: Once | INTRAVENOUS | Status: AC
  Administered 2019-02-19: 4000 [IU] via INTRAVENOUS
  Filled 2019-02-19: qty 4000

## 2019-02-19 MED ORDER — OXYCODONE-ACETAMINOPHEN 7.5-325 MG PO TABS
1.0000 | ORAL_TABLET | Freq: Once | ORAL | Status: AC
  Administered 2019-02-19: 13:00:00 1 via ORAL

## 2019-02-19 MED ORDER — HEPARIN (PORCINE) 25000 UT/250ML-% IV SOLN
1550.0000 [IU]/h | INTRAVENOUS | Status: DC
  Administered 2019-02-19: 1100 [IU]/h via INTRAVENOUS
  Filled 2019-02-19: qty 250

## 2019-02-19 MED ORDER — AMLODIPINE BESYLATE 10 MG PO TABS
10.0000 mg | ORAL_TABLET | Freq: Every day | ORAL | Status: DC
  Administered 2019-02-21 – 2019-02-27 (×6): 10 mg via ORAL
  Filled 2019-02-19 (×8): qty 1

## 2019-02-19 MED ORDER — HEPARIN SODIUM (PORCINE) 5000 UNIT/ML IJ SOLN: 5000.0000 [IU] | Freq: Three times a day (TID) | INTRAMUSCULAR | Status: DC

## 2019-02-19 MED ORDER — HYDROMORPHONE HCL 1 MG/ML IJ SOLN
0.5000 mg | Freq: Four times a day (QID) | INTRAMUSCULAR | Status: DC
  Administered 2019-02-20 (×2): 1 mg via INTRAVENOUS
  Filled 2019-02-19 (×2): qty 1

## 2019-02-19 MED ORDER — NALOXONE HCL 2 MG/2ML IJ SOSY
2.0000 mg | PREFILLED_SYRINGE | Freq: Once | INTRAMUSCULAR | Status: DC | PRN
  Filled 2019-02-19: qty 2

## 2019-02-19 MED ORDER — HYDROMORPHONE HCL 1 MG/ML IJ SOLN
0.5000 mg | Freq: Once | INTRAMUSCULAR | Status: AC
  Administered 2019-02-19: 0.5 mg via INTRAVENOUS
  Filled 2019-02-19: qty 0.5

## 2019-02-19 MED ORDER — HYDROMORPHONE HCL 1 MG/ML IJ SOLN
0.5000 mg | Freq: Four times a day (QID) | INTRAMUSCULAR | Status: DC | PRN
  Administered 2019-02-19 – 2019-02-20 (×2): 0.5 mg via INTRAVENOUS
  Filled 2019-02-19: qty 0.5

## 2019-02-19 MED ORDER — SODIUM CHLORIDE 0.9% FLUSH
3.0000 mL | Freq: Two times a day (BID) | INTRAVENOUS | Status: DC
  Administered 2019-02-19 – 2019-02-26 (×7): 3 mL via INTRAVENOUS

## 2019-02-19 MED ORDER — POTASSIUM CHLORIDE 10 MEQ/100ML IV SOLN
10.0000 meq | INTRAVENOUS | Status: AC
  Administered 2019-02-19 – 2019-02-20 (×6): 10 meq via INTRAVENOUS
  Filled 2019-02-19 (×5): qty 100

## 2019-02-19 NOTE — Consult Note (Signed)
[facility] [place]  Patient name: [name] [name] MRN: [id number] DOB: February 19, 1961 Sex: male   REQUESTING PROVIDER:   Hospital service   REASON FOR CONSULT:    Left great toe ischemia with ulcer  HISTORY OF PRESENT ILLNESS:   [name] [name] is a 58 y.o. male, who presented to clinic today with left great toe pain and skin breakdown which is been going on for several weeks.  He also reports a history of claudication.  ABI was checked but unable to get a ankle pressure consistent with severe PAD.  Vascular surgery was consulted for further management.  I saw the patient in the clinic.  He is having pain in his left great toe.  He denies having any fevers or drainage.  He also has a open wound on the shin of his right lower leg from bumping his shin.  The patient has a significant smoking history but is down to 3 cigarettes/day.  He is medically managed for hypertension.  He has a history of rheumatoid arthritis.  He is not on a statin  PAST MEDICAL HISTORY    Past Medical History:  Diagnosis Date  . Anemia   . Anemia 08/06/2012  . 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 Community acquired pneumonia 11/07/2011  . DDD (degenerative disc disease), lumbosacral     and grade 2 slip  . GERD (gastroesophageal reflux disease)   . Heart murmur   . History of anemia    no current med.  . Hypertension    states is borderline on medication; has been on med. x 5-6 yr.  . Hypokalemia 05/30/2018  . Impingement syndrome of shoulder region 10/2014   left  . Insomnia 06/01/2015  . Left shoulder pain 12/16/2015  . Low back pain without sciatica 10/29/2009  . Lupus ([place])   . Pneumonia   . RA (rheumatoid arthritis) ([place])   . Rotator cuff tear 11/04/2014     FAMILY HISTORY   Family History  Problem Relation Age of Onset  . Heart attack Father 82  . Stroke Neg Hx   . Cancer Neg Hx     SOCIAL HISTORY:   Social History   Socioeconomic History   . Marital status: Single    Spouse name: Not on file  . Number of children: Not on file  . Years of education: Not on file  . Highest education level: Not on file  Occupational History  . Not on file  Social Needs  . Financial resource strain: Not on file  . Food insecurity    Worry: Not on file    Inability: Not on file  . Transportation needs    Medical: Not on file    Non-medical: Not on file  Tobacco Use  . Smoking status: Current Every Day Smoker    Packs/day: 2.00    Years: 20.00    Pack years: 40.00    Types: Cigarettes  . Smokeless tobacco: Never Used  . Tobacco comment: 2-3 packs per day  Substance and Sexual Activity  . Alcohol use: No    Alcohol/week: 0.0 standard drinks    Comment: sober 1998  . Drug use: No  . Sexual activity: Not on file  Lifestyle  . Physical activity    Days per week: Not on file    Minutes per session: Not on file  . Stress: Not on file  Relationships  . Social connections    Talks on phone: Not on file  Gets together: Not on file    Attends religious service: Not on file    Active member of club or organization: Not on file    Attends meetings of clubs or organizations: Not on file    Relationship status: Not on file  . Intimate partner violence    Fear of current or ex partner: Not on file    Emotionally abused: Not on file    Physically abused: Not on file    Forced sexual activity: Not on file  Other Topics Concern  . Not on file  Social History Narrative   Patient lives by himself, is on disability since 1992, used to work with [employer] [place]. 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 Smokes 1/2 PPD and has been smoking since 25 years. Quit drinking in 1990.    ALLERGIES:    Allergies  Allergen Reactions  . Morphine And Related Rash  . Ramipril Rash    Per patient on R arm and leg only; no angioedema  . Tramadol Itching and Rash    CURRENT MEDICATIONS:    Current Facility-Administered Medications  Medication Dose Route  Frequency Provider Last Rate Last Dose  . [START ON 02/20/2019] amLODipine (NORVASC) tablet 10 mg  10 mg Oral Daily Neva Seat, MD      . Derrill Memo ON 02/20/2019] chlorthalidone (HYGROTON) tablet 25 mg  25 mg Oral Daily Neva Seat, MD      . heparin ADULT infusion 100 units/mL (25000 units/283mL sodium chloride 0.45%)  1,100 Units/hr Intravenous Continuous Oda Kilts, MD 11 mL/hr at 02/19/19 1904 1,100 Units/hr at 02/19/19 1904  . [START ON 02/20/2019] HYDROmorphone (DILAUDID) injection 0.5-1 mg  0.5-1 mg Intravenous Q6H Al Decant, MD      . nicotine (NICODERM CQ - dosed in mg/24 hours) patch 21 mg  21 mg Transdermal Daily Neva Seat, MD   21 mg at 02/19/19 1908  . potassium chloride 10 mEq in 100 mL IVPB  10 mEq Intravenous Q1 Hr x 6 Neva Seat, MD 100 mL/hr at 02/19/19 2047 10 mEq at 02/19/19 2047  . [START ON 02/20/2019] predniSONE (DELTASONE) tablet 5 mg  5 mg Oral Daily Neva Seat, MD      . sodium chloride flush (NS) 0.9 % injection 3 mL  3 mL Intravenous Q12H Neva Seat, MD   3 mL at 02/19/19 2106    REVIEW OF SYSTEMS:   [X]  denotes positive finding, [ ]  denotes negative finding Cardiac  Comments:  Chest pain or chest pressure:    Shortness of breath upon exertion:    Short of breath when lying flat:    Irregular heart rhythm:        Vascular    Pain in calf, thigh, or hip brought on by ambulation: xx   Pain in feet at night that wakes you up from your sleep:  x   Blood clot in your veins:    Leg swelling:         Pulmonary    Oxygen at home:    Productive cough:     Wheezing:         Neurologic    Sudden weakness in arms or legs:     Sudden numbness in arms or legs:     Sudden onset of difficulty speaking or slurred speech:    Temporary loss of vision in one eye:     Problems with dizziness:         Gastrointestinal    Blood in stool:  Vomited blood:         Genitourinary    Burning when urinating:     Blood in  urine:        Psychiatric    Major depression:         Hematologic    Bleeding problems:    Problems with blood clotting too easily:        Skin    Rashes or ulcers:        Constitutional    Fever or chills:     PHYSICAL EXAM:   Vitals:   02/19/19 1534 02/19/19 2101  BP: (!) 120/91 121/88  Pulse: 88 90  Resp: 14 20  Temp:  97.8 F (36.6 C)  TempSrc:  Oral  SpO2: 98% 95%    GENERAL: The patient is a well-nourished male, in no acute distress. The vital signs are documented above. CARDIAC: There is a regular rate and rhythm.  VASCULAR: Easily palpable bilateral femoral pulses.  I cannot palpate popliteal or pedal pulses PULMONARY: Nonlabored respirations ABDOMEN: Soft and non-tender with normal pitched bowel sounds.  MUSCULOSKELETAL: There are no major deformities or cyanosis. NEUROLOGIC: No focal weakness or paresthesias are detected. SKIN: Ulceration on the dorsum of the left great toe with surrounding discoloration consistent with ischemia.  No purulent drainage. PSYCHIATRIC: The patient has a normal affect.      STUDIES:   I have reviewed his ABI report which is consistent with severe peripheral vascular disease  ASSESSMENT and PLAN   Left great toe ischemia: The patient has ulcer and ischemic changes to the left great toe.  His ABIs today are consistent with severe peripheral vascular disease.  In addition he has a wound on his right anterior shin.  I discussed with the patient that he is at risk for digital amputation and possibly a more proximal amputation.  We discussed proceeding with catheter-based angiography to define his anatomy and intervene if indicated.  The details of the procedure as well as the risks and benefits were discussed with him and he wishes to proceed.  He will need evaluation of both legs with focus on the left.  He will need to be started on a statin given his severe peripheral vascular disease.  He will need to be n.p.o. after midnight  with plans for his procedure tomorrow afternoon.  He understands at 1 my partners may be performing the procedure.   Leia Alf, MD, FACS [facility] [phone] Pager [phone]

## 2019-02-19 NOTE — Progress Notes (Signed)
Nurse attempted to place an iv site pt iv site infiltrated  IV team consult in placed. pt needs two iv site for IV potassium and heparin drip.

## 2019-02-19 NOTE — Assessment & Plan Note (Signed)
Hypertension: Blood pressure not at goal today.  BP has ranged between 110s-130s/60s-80s.  BP Readings from Last 3 Encounters:  02/19/19 (!) 141/89  06/18/18 126/88  05/30/18 122/84   Plan: -Continue Moding 10 mg daily -Continue chlorthalidone 25 mg daily -Advised on tobacco cessation

## 2019-02-19 NOTE — Assessment & Plan Note (Signed)
Acute critical limb ischemia: [name] [name] has a history of tobacco use disorder, hypertension, coronary artery disease and newly diagnosed peripheral arterial disease who presents with a 15-day history of left great toe pain and discoloration.  He reports that initially the left great toe started swelling and later on developed a lesion on the medial side of the great toe.  He wears the pain currently at 8 out of 10.  He also reports that over the past 4 to 5 days he has been experiencing worsening claudication of the lower extremities bilaterally he had similar symptoms last year which apparently resolved with potassium supplements.  On physical exams, the left great toe looks cyanotic/ischemic, cold to touch, there is loss of hair anteriorly over the left shin of the left lower extremity.  Dorsalis pedis and posterior tibial pulses are decreased bilaterally.  ABI performed in the clinic showed severe PAD bilaterally.      Plan: - Urgent admission to the inpatient service for vascular evaluation - Counseled on tobacco cessation

## 2019-02-19 NOTE — Progress Notes (Signed)
Patient started on the heparin drip at  11 ml /hr after iv site was establish, pt continues to received iv potassium pharmacy contacted for time adjustment. Patient also placed on telemetry for cardiac monitoring.

## 2019-02-19 NOTE — H&P (Addendum)
Date: 02/19/2019               Patient Name:  [name] [name] MRN: [id number]  DOB: 1960-08-02 Age / Sex: 58 y.o., male   PCP: Jean Rosenthal, MD         Medical Service: Internal Medicine Teaching Service         Attending Physician: Dr. Rebeca Alert Raynaldo Opitz, MD    First Contact: Dr. Gilford Rile Pager: [phone]  Second Contact: Dr. Tarri Abernethy Pager: [phone]       After Hours (After 5p/  First Contact Pager: [phone]  weekends / holidays): Second Contact Pager: [phone]   Chief Complaint: Left Great Toe pain  History of Present Illness: [name] [name] is a 58 yo M with Hx of HTN, Tobacco Use, HLD, CAD, and RA who presented to the IM Clinic with 2 weeks of Pain in his left great toe. The pain is described as a constant 8/10 nonradiating pain. The pain increased to 10/10 when touched. He has not been able to achieve significant relief. He state that the the was initially swollen, but swelling has come down and toe has now turned pale. He had 1 prior episode of similar pain about a year ago in the same toe that resolved spontaneously after a few days. About 5 days ago, he began to develop bilateral claudication with calf pain on ambulation; he thought the cramps may have been from low potassium, which he reports taking for leg cramps in the past. He also reports intermittent numbness of his left toes for the past 5 days. He injury his right shin on a trailer hitch about 4 days ago. He denies fevers, chills, nausea, constipation, diarrhea, or trauma to his toe.  In the clinic: CMP and CBC were ordered. ABI's were peformed, which showed severe PAD Bilaterally. Patient to be admitted for further workup and care.   Meds: No current facility-administered medications on file prior to encounter.    Current Outpatient Medications on File Prior to Encounter  Medication Sig  . amLODipine (NORVASC) 10 MG tablet TAKE 1 TABLET (10 MG TOTAL) BY MOUTH DAILY.  . chlorthalidone (HYGROTON) 25 MG tablet TAKE 1 TABLET BY  MOUTH EVERY DAY (Patient taking differently: Take 25 mg by mouth daily. )  . predniSONE (DELTASONE) 5 MG tablet TAKE 1 TABLET BY MOUTH EVERY DAY (Patient taking differently: Take 5 mg by mouth daily with breakfast. )  . naloxone (NARCAN) 2 MG/2ML injection Inject 2 mg into the muscle once as needed (opiod overdose (call 911, inject intramuscularly in shoulder or thigh. Repeat every 3 minutes)).     Allergies: Allergies as of 02/19/2019 - Review Complete 02/19/2019  Allergen Reaction Noted  . Morphine and related Rash 08/03/2017  . Ramipril Rash 06/23/2011  . Tramadol Itching and Rash 06/23/2011   Past Medical History:  Diagnosis Date  . Anemia   . Anemia 08/06/2012  . 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 Community acquired pneumonia 11/07/2011  . DDD (degenerative disc disease), lumbosacral     and grade 2 slip  . GERD (gastroesophageal reflux disease)   . Heart murmur   . History of anemia    no current med.  . Hypertension    states is borderline on medication; has been on med. x 5-6 yr.  . Hypokalemia 05/30/2018  . Impingement syndrome of shoulder region 10/2014   left  . Insomnia 06/01/2015  . Left shoulder pain 12/16/2015  . Low back pain without sciatica  10/29/2009  . Lupus ([place])   . Pneumonia   . RA (rheumatoid arthritis) ([place])   . Rotator cuff tear 11/04/2014    Family History:  Family History  Problem Relation Age of Onset  . Heart attack Father 38  . Stroke Neg Hx   . Cancer Neg Hx   Reviewed on admission  Social History:  Social History   Tobacco Use  . Smoking status: Current Every Day Smoker    Packs/day: 2.00    Years: 20.00    Pack years: 40.00    Types: Cigarettes  . Smokeless tobacco: Never Used  . Tobacco comment: 2-3 packs per day  Substance Use Topics  . Alcohol use: No    Alcohol/week: 0.0 standard drinks    Comment: sober 1998  . Drug use: No  Reviewed on admission  Review of Systems: A complete ROS was negative except as per HPI.   Physical  Exam: Blood pressure (!) 120/91, pulse 88, resp. rate 14, SpO2 98 %. Physical Exam Constitutional:      Appearance: Normal appearance.     Comments: Mild distress due to pain  HENT:     Head: Normocephalic and atraumatic.  Cardiovascular:     Rate and Rhythm: Normal rate and regular rhythm.     Heart sounds: Normal heart sounds.     Comments: Decreased left dorsalis pedis and posterior tibial pulses Pulmonary:     Effort: Pulmonary effort is normal. No respiratory distress.     Breath sounds: Normal breath sounds.  Abdominal:     General: Bowel sounds are normal. There is no distension.     Palpations: Abdomen is soft.     Tenderness: There is no abdominal tenderness.  Musculoskeletal:        General: Tenderness and deformity present. No swelling.     Right lower leg: No edema.     Left lower leg: No edema.     Comments: Ulnar deviation of Bilateral 2nd-5th digits Left great toe tender to palpation  Skin:    General: Skin is dry.     Comments: Cool toes on left Cool, dusky left great toe Wound at right shin  Neurological:     General: No focal deficit present.     Mental Status: Mental status is at baseline.    EKG: Not yet performed  Assessment & Plan by Problem: Principal Problem:   Critical lower limb ischemia  Critical Limb Ischemia: Patient with 2 weeks of L toe pain, and 5 days of worsened claudication symptoms. He has risk factors of HLD and Tobacco use. He also has history of CAD. Exam significant for L toe pain, L cool toes with dusky great toe, Decreased L pedal pulses, and Intermittent numbness of left toes. ABI in clinic with severe bilateral anticoagulation. - Consult vascular surgery - Counseled on tobacco cessation - Dilaudid PRN, Percocet when diet resumed  RA: On Prednisone. Was previously on MTX and Humira. This was held for Spine surgery in 2019. Rheumatology referral placed by PCP. - Continue Prednisone  Chronic Pain: Takes 10mg  Percocet about q6h  PRN per database review. - Dilaudid PRN, Percocet when diet resumed  Hypokalemia: K 2.9 in clinic. Will give IV KCl when on floor and add-on Mg. Recheck in AM - IV KCl 5mEq x6 - AM BMP  CAD: Cath in 2018 with mild to moderate non-obstructing CAD of LCx and RCA HTN: Continue home Amlodipine HLD: Last Lipid Panel was in 2013. Not currently on statin therapy.  Total: 151, TG 269(H), HDL (L) - Repeat Lipid Panel  Dispo: Admit patient to Inpatient with expected length of stay greater than 2 midnights.  Signed: Neva Seat, MD 02/19/2019, 6:05 PM  Pager: [phone]

## 2019-02-19 NOTE — Progress Notes (Signed)
  Pt orientation to unit, room and routine. Information packet given to patient/family and safety video watched.  Admission INP armband ID verified with patient/family, and in place. SR up x 2, fall risk assessment complete with Patient and family verbalizing understanding of risks associated with falls. Pt verbalizes an understanding of how to use the call bell and to call for help before getting out of bed. Open wound noted in left great toe and left anterior shin. Will cont to monitor and assist as needed.  Hosie Spangle, RN 02/19/2019 6:41 PM

## 2019-02-19 NOTE — Progress Notes (Signed)
Internal Medicine Clinic Attending  I saw and evaluated the patient.  I personally confirmed the key portions of the history and exam documented by Dr. Eileen Stanford and I reviewed pertinent patient test results.  The assessment, diagnosis, and plan were formulated together and I agree with the documentation in the resident's note.  58 year old person with tobacco use disorder here with several days of claudication and rest pain in the left foot, improved with hanging his foot off the side of the bed. The left great toe has signs of active ischemia with a shallow distal ulcer, no purulence or sign of infection on exam. I cannot palpate a DP pulse, the foot is warm but the left great toe is cold. No hair on the anterior shins. Our ABI machine unable to measure bilateral ankle pressure consistent with severe bilateral PAD, and clinically he has acute ischemia of the left foot. We will admit him for anticoagulation with heparin infusion and consultation with vascular surgery to consider angiography.

## 2019-02-19 NOTE — Assessment & Plan Note (Signed)
Rheumatoid arthritis: This was diagnosed about 27 years ago and had previously been on prednisone, methotrexate and Humira.  He had a spinal fusion in July 2019 and reports that he was told to hold off on methotrexate and Humira for 1 year.  He has not been evaluated by rheumatology and was actually dismissed from the clinic due to no-show.  On physical exams, he has ulnar deviation bilaterally at the metacarpal phalangeal joints.  Plan: - Continue prednisone 5 mg daily - Refer to rheumatology

## 2019-02-19 NOTE — Progress Notes (Signed)
   CC: Follow-up hypertension, left toe pain  HPI:  [name] is a 58 y.o. very pleasant gentleman with tobacco use disorder, coronary artery disease, peripheral arterial disease, hypertension and rheumatoid arthritis presented for evaluation of left great toe pain.  Please see problem based charting for further details.  Past Medical History:  Diagnosis Date  . Anemia   . Anemia 08/06/2012  . 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 Community acquired pneumonia 11/07/2011  . DDD (degenerative disc disease), lumbosacral     and grade 2 slip  . GERD (gastroesophageal reflux disease)   . Heart murmur   . History of anemia    no current med.  . Hypertension    states is borderline on medication; has been on med. x 5-6 yr.  . Hypokalemia 05/30/2018  . Impingement syndrome of shoulder region 10/2014   left  . Insomnia 06/01/2015  . Left shoulder pain 12/16/2015  . Low back pain without sciatica 10/29/2009  . Lupus ([place])   . Pneumonia   . RA (rheumatoid arthritis) ([place])   . Rotator cuff tear 11/04/2014   Review of Systems:  As per HPI  Physical Exam:  Vitals:   02/19/19 1014 02/19/19 1019  BP: (!) 141/89   Pulse: (!) 101   Temp: 98.1 F (36.7 C)   TempSrc: Oral   SpO2: 99%   Weight: 179 lb 9.6 oz (81.5 kg)   Height:  5\' 6"  (1.676 m)   Physical Exam  Constitutional: He appears distressed (Due to toe pain).  HENT:  Head: Normocephalic and atraumatic.  Pulmonary/Chest: Effort normal and breath sounds normal. He has no wheezes. He has no rales.  Musculoskeletal:        General: Tenderness, deformity and edema present.  Skin: He is not diaphoretic.  -Left great toe cool to touch, ischemic, loss of hair over the anterior shin of the left lower extremity.  -2 x 2 centimeter wound over the anterior shin of the right lower extremity after bumping into a truck         Assessment & Plan:   See Encounters Tab for problem based charting.  Patient discussed with Dr. Evette Doffing

## 2019-02-19 NOTE — Addendum Note (Signed)
Addended by: Jean Rosenthal on: 02/19/2019 12:38 PM   Modules accepted: Orders

## 2019-02-19 NOTE — Progress Notes (Addendum)
ANTICOAGULATION CONSULT NOTE - Initial Consult  Pharmacy Consult for Heparin Indication: Critical Limb Ischemia  Allergies  Allergen Reactions  . Morphine And Related Rash  . Ramipril Rash    Per patient on R arm and leg only; no angioedema  . Tramadol Itching and Rash    Patient Measurements:   TBW 81.5 kg Height: 66 inches Heparin Dosing Weight: 80.3 kg   Vital Signs: Temp: 97.9 F (36.6 C) (08/26 1351) Temp Source: Oral (08/26 1351) BP: 120/91 (08/26 1534) Pulse Rate: 88 (08/26 1534)  Labs: Recent Labs    02/19/19 1149  HGB 14.3  HCT 42.5  PLT 154  CREATININE 0.85    Estimated Creatinine Clearance: 95 mL/min (by C-G formula based on SCr of 0.85 mg/dL).   Medical History: Past Medical History:  Diagnosis Date  . Anemia   . Anemia 08/06/2012  . 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 Community acquired pneumonia 11/07/2011  . DDD (degenerative disc disease), lumbosacral     and grade 2 slip  . GERD (gastroesophageal reflux disease)   . Heart murmur   . History of anemia    no current med.  . Hypertension    states is borderline on medication; has been on med. x 5-6 yr.  . Hypokalemia 05/30/2018  . Impingement syndrome of shoulder region 10/2014   left  . Insomnia 06/01/2015  . Left shoulder pain 12/16/2015  . Low back pain without sciatica 10/29/2009  . Lupus ([place])   . Pneumonia   . RA (rheumatoid arthritis) ([place])   . Rotator cuff tear 11/04/2014    Assessment: 58 yr old male with hx of  HTN, tobacco use, HLD, CAD, and RA presented to IM clinic with 2 week of pain in left great toe. About 5 days ago, he developed bilateral claudication with calf pain on ambulation and intermittent numbness in left toes. In IM clinic, ABIs were performed showing severe bilateral PAD.  CBC WNL  Goal of Therapy:  Heparin level 0.3-0.5 units/mL Monitor platelets by anticoagulation protocol: Yes   Plan:  Heparin 4000 units IV bolus, followed by heparin infusion at 1120  units/hr Heparin level 6 hrs after starting infusion and daily Monitor CBC daily Monitor and signs/symptoms of bleeding  Gillermina Hu, PharmD, BCPS, [facility] Clinical Pharmacist 02/19/2019,5:10 PM

## 2019-02-20 ENCOUNTER — Encounter (HOSPITAL_COMMUNITY): Payer: Self-pay | Admitting: General Practice

## 2019-02-20 ENCOUNTER — Ambulatory Visit (HOSPITAL_COMMUNITY): Admission: RE | Admit: 2019-02-20 | Payer: Medicare Other | Source: Home / Self Care | Admitting: Vascular Surgery

## 2019-02-20 ENCOUNTER — Encounter (HOSPITAL_COMMUNITY): Payer: Medicare Other

## 2019-02-20 ENCOUNTER — Encounter (HOSPITAL_COMMUNITY)
Admission: AD | Disposition: A | Discharge status: Home / Self Care | Payer: Self-pay | Source: Ambulatory Visit | Attending: Internal Medicine

## 2019-02-20 DIAGNOSIS — I739 Peripheral vascular disease, unspecified: Secondary | ICD-10-CM

## 2019-02-20 DIAGNOSIS — M069 Rheumatoid arthritis, unspecified: Secondary | ICD-10-CM

## 2019-02-20 DIAGNOSIS — I10 Essential (primary) hypertension: Secondary | ICD-10-CM

## 2019-02-20 DIAGNOSIS — E785 Hyperlipidemia, unspecified: Secondary | ICD-10-CM

## 2019-02-20 DIAGNOSIS — Z7952 Long term (current) use of systemic steroids: Secondary | ICD-10-CM

## 2019-02-20 DIAGNOSIS — Z79899 Other long term (current) drug therapy: Secondary | ICD-10-CM

## 2019-02-20 DIAGNOSIS — E876 Hypokalemia: Secondary | ICD-10-CM

## 2019-02-20 DIAGNOSIS — Z79891 Long term (current) use of opiate analgesic: Secondary | ICD-10-CM

## 2019-02-20 DIAGNOSIS — G8929 Other chronic pain: Secondary | ICD-10-CM

## 2019-02-20 DIAGNOSIS — L97529 Non-pressure chronic ulcer of other part of left foot with unspecified severity: Secondary | ICD-10-CM

## 2019-02-20 HISTORY — PX: ABDOMINAL AORTOGRAM W/LOWER EXTREMITY: CATH118223

## 2019-02-20 HISTORY — PX: ABDOMINAL AORTAGRAM: SHX5706

## 2019-02-20 LAB — CBC
HCT: 38.8 % — ABNORMAL LOW (ref 39.0–52.0)
Hemoglobin: 13 g/dL (ref 13.0–17.0)
MCH: 29.3 pg (ref 26.0–34.0)
MCHC: 33.5 g/dL (ref 30.0–36.0)
MCV: 87.6 fL (ref 80.0–100.0)
Platelets: 145 10*3/uL — ABNORMAL LOW (ref 150–400)
RBC: 4.43 MIL/uL (ref 4.22–5.81)
RDW: 15.1 % (ref 11.5–15.5)
WBC: 6.7 10*3/uL (ref 4.0–10.5)
nRBC: 0 % (ref 0.0–0.2)

## 2019-02-20 LAB — BASIC METABOLIC PANEL
Anion gap: 9 (ref 5–15)
BUN: 13 mg/dL (ref 6–20)
CO2: 26 mmol/L (ref 22–32)
Calcium: 8.6 mg/dL — ABNORMAL LOW (ref 8.9–10.3)
Chloride: 101 mmol/L (ref 98–111)
Creatinine, Ser: 0.84 mg/dL (ref 0.61–1.24)
GFR calc Af Amer: >60 mL/min (ref >60)
GFR calc non Af Amer: >60 mL/min (ref >60)
Glucose, Bld: 105 mg/dL — ABNORMAL HIGH (ref 70–99)
Potassium: 3.4 mmol/L — ABNORMAL LOW (ref 3.5–5.1)
Sodium: 136 mmol/L (ref 135–145)

## 2019-02-20 LAB — LIPID PANEL
Cholesterol: 149 mg/dL (ref 0–200)
HDL: 23 mg/dL — ABNORMAL LOW (ref >40)
LDL Cholesterol: 81 mg/dL (ref 0–99)
Total CHOL/HDL Ratio: 6.5 RATIO
Triglycerides: 225 mg/dL — ABNORMAL HIGH (ref <150)
VLDL: 45 mg/dL — ABNORMAL HIGH (ref 0–40)

## 2019-02-20 LAB — HEPARIN LEVEL (UNFRACTIONATED)
Heparin Unfractionated: 0.12 IU/mL — ABNORMAL LOW (ref 0.30–0.70)
Heparin Unfractionated: 0.19 IU/mL — ABNORMAL LOW (ref 0.30–0.70)

## 2019-02-20 LAB — SARS CORONAVIRUS 2 BY RT PCR (HOSPITAL ORDER, PERFORMED IN ~~LOC~~ HOSPITAL LAB): SARS Coronavirus 2: NEGATIVE

## 2019-02-20 SURGERY — ABDOMINAL AORTOGRAM W/LOWER EXTREMITY
Anesthesia: LOCAL

## 2019-02-20 MED ORDER — HEPARIN BOLUS VIA INFUSION
2000.0000 [IU] | Freq: Once | INTRAVENOUS | Status: DC
  Filled 2019-02-20: qty 2000

## 2019-02-20 MED ORDER — HYDROMORPHONE HCL 1 MG/ML IJ SOLN
INTRAMUSCULAR | Status: AC
  Filled 2019-02-20: qty 0.5

## 2019-02-20 MED ORDER — ACETAMINOPHEN 325 MG PO TABS: 650.0000 mg | ORAL_TABLET | ORAL | Status: DC | PRN

## 2019-02-20 MED ORDER — FENTANYL CITRATE (PF) 100 MCG/2ML IJ SOLN
INTRAMUSCULAR | Status: DC | PRN
  Administered 2019-02-20: 25 ug via INTRAVENOUS

## 2019-02-20 MED ORDER — ONDANSETRON HCL 4 MG/2ML IJ SOLN: 4.0000 mg | Freq: Four times a day (QID) | INTRAMUSCULAR | Status: DC | PRN

## 2019-02-20 MED ORDER — MIDAZOLAM HCL 2 MG/2ML IJ SOLN
INTRAMUSCULAR | Status: AC
  Filled 2019-02-20: qty 2

## 2019-02-20 MED ORDER — ATORVASTATIN CALCIUM 10 MG PO TABS: 20.0000 mg | ORAL_TABLET | Freq: Every day | ORAL | Status: DC

## 2019-02-20 MED ORDER — HEPARIN (PORCINE) IN NACL 1000-0.9 UT/500ML-% IV SOLN
INTRAVENOUS | Status: DC | PRN
  Administered 2019-02-20: 500 mL

## 2019-02-20 MED ORDER — HEPARIN (PORCINE) 25000 UT/250ML-% IV SOLN
1550.0000 [IU]/h | INTRAVENOUS | Status: DC
  Administered 2019-02-20: 1550 [IU]/h via INTRAVENOUS
  Filled 2019-02-20 (×2): qty 250

## 2019-02-20 MED ORDER — SODIUM CHLORIDE 0.9% FLUSH: 3.0000 mL | INTRAVENOUS | Status: DC | PRN

## 2019-02-20 MED ORDER — SODIUM CHLORIDE 0.9 % WEIGHT BASED INFUSION: 1.0000 mL/kg/h | INTRAVENOUS | Status: AC

## 2019-02-20 MED ORDER — LIDOCAINE HCL (PF) 1 % IJ SOLN
INTRAMUSCULAR | Status: AC
  Filled 2019-02-20: qty 30

## 2019-02-20 MED ORDER — HEPARIN (PORCINE) IN NACL 1000-0.9 UT/500ML-% IV SOLN
INTRAVENOUS | Status: AC
  Filled 2019-02-20: qty 1000

## 2019-02-20 MED ORDER — SODIUM CHLORIDE 0.9 % IV SOLN
INTRAVENOUS | Status: DC
  Administered 2019-02-20: 08:00:00 via INTRAVENOUS

## 2019-02-20 MED ORDER — LABETALOL HCL 5 MG/ML IV SOLN: 10.0000 mg | INTRAVENOUS | Status: DC | PRN

## 2019-02-20 MED ORDER — HYDROMORPHONE HCL 1 MG/ML IJ SOLN
0.5000 mg | INTRAMUSCULAR | Status: DC | PRN
  Administered 2019-02-20 – 2019-02-21 (×5): 1 mg via INTRAVENOUS
  Administered 2019-02-21: 0.5 mg via INTRAVENOUS
  Administered 2019-02-21 – 2019-02-22 (×2): 1 mg via INTRAVENOUS
  Filled 2019-02-20 (×8): qty 1

## 2019-02-20 MED ORDER — ATORVASTATIN CALCIUM 80 MG PO TABS
80.0000 mg | ORAL_TABLET | Freq: Every day | ORAL | Status: DC
  Administered 2019-02-20 – 2019-02-26 (×7): 80 mg via ORAL
  Filled 2019-02-20 (×7): qty 1

## 2019-02-20 MED ORDER — HEPARIN BOLUS VIA INFUSION
2000.0000 [IU] | Freq: Once | INTRAVENOUS | Status: AC
  Administered 2019-02-20: 02:00:00 2000 [IU] via INTRAVENOUS
  Filled 2019-02-20: qty 2000

## 2019-02-20 MED ORDER — VERAPAMIL HCL 2.5 MG/ML IV SOLN
INTRAVENOUS | Status: AC
  Filled 2019-02-20: qty 2

## 2019-02-20 MED ORDER — IODIXANOL 320 MG/ML IV SOLN
INTRAVENOUS | Status: DC | PRN
  Administered 2019-02-20: 105 mL via INTRA_ARTERIAL

## 2019-02-20 MED ORDER — HYDRALAZINE HCL 20 MG/ML IJ SOLN: 5.0000 mg | INTRAMUSCULAR | Status: DC | PRN

## 2019-02-20 MED ORDER — POTASSIUM CHLORIDE 10 MEQ/100ML IV SOLN
INTRAVENOUS | Status: AC
  Administered 2019-02-20: 10 meq via INTRAVENOUS
  Filled 2019-02-20: qty 100

## 2019-02-20 MED ORDER — LIDOCAINE HCL (PF) 1 % IJ SOLN
INTRAMUSCULAR | Status: DC | PRN
  Administered 2019-02-20: 15 mL

## 2019-02-20 MED ORDER — POTASSIUM CHLORIDE CRYS ER 20 MEQ PO TBCR
20.0000 meq | EXTENDED_RELEASE_TABLET | Freq: Two times a day (BID) | ORAL | Status: AC
  Administered 2019-02-20: 20 meq via ORAL
  Filled 2019-02-20: qty 1

## 2019-02-20 MED ORDER — SODIUM CHLORIDE 0.9 % IV SOLN: 250.0000 mL | INTRAVENOUS | Status: DC | PRN

## 2019-02-20 MED ORDER — FENTANYL CITRATE (PF) 100 MCG/2ML IJ SOLN
INTRAMUSCULAR | Status: AC
  Filled 2019-02-20: qty 2

## 2019-02-20 MED ORDER — SODIUM CHLORIDE 0.9% FLUSH
3.0000 mL | Freq: Two times a day (BID) | INTRAVENOUS | Status: DC
  Administered 2019-02-20 – 2019-02-21 (×3): 3 mL via INTRAVENOUS

## 2019-02-20 MED ORDER — MIDAZOLAM HCL 2 MG/2ML IJ SOLN
INTRAMUSCULAR | Status: DC | PRN
  Administered 2019-02-20: 1 mg via INTRAVENOUS

## 2019-02-20 SURGICAL SUPPLY — 9 items
CATH OMNI FLUSH 5F 65CM (CATHETERS) ×1 IMPLANT
KIT MICROPUNCTURE NIT STIFF (SHEATH) ×1 IMPLANT
KIT PV (KITS) ×2 IMPLANT
SHEATH PINNACLE 5F 10CM (SHEATH) ×1 IMPLANT
SHEATH PROBE COVER 6X72 (BAG) ×1 IMPLANT
SYR MEDRAD MARK V 150ML (SYRINGE) ×1 IMPLANT
TRANSDUCER W/STOPCOCK (MISCELLANEOUS) ×2 IMPLANT
TRAY PV CATH (CUSTOM PROCEDURE TRAY) ×2 IMPLANT
WIRE BENTSON .035X145CM (WIRE) ×1 IMPLANT

## 2019-02-20 NOTE — Progress Notes (Signed)
ANTICOAGULATION CONSULT NOTE - Follow Up Consult  Pharmacy Consult for Heparin Indication: ischemic limb  Allergies  Allergen Reactions  . Morphine And Related Rash  . Ramipril Rash    Per patient on R arm and leg only; no angioedema  . Tramadol Itching and Rash    Patient Measurements:   Heparin Dosing Weight:    Vital Signs: Temp: 98 F (36.7 C) (08/27 0627) Temp Source: Temporal (08/27 1127) BP: 131/93 (08/27 1127) Pulse Rate: 76 (08/27 1127)  Labs: Recent Labs    02/19/19 1149 02/19/19 1805 02/20/19 0007 02/20/19 0826  HGB 14.3  --  13.0  --   HCT 42.5  --  38.8*  --   PLT 154  --  145*  --   LABPROT  --  13.2  --   --   INR  --  1.0  --   --   HEPARINUNFRC  --   --  0.12* 0.19*  CREATININE 0.85  --  0.84  --     Estimated Creatinine Clearance: 96.1 mL/min (by C-G formula based on SCr of 0.84 mg/dL).  Assessment:  Anticoag: Ischemic Limb. Hgb/Hct 13.0/38.8, down from 14.3/42.5, Plt 145 HL 8/27 AM 0.19>> went to aortogram with bilateral lower extremity arteriogram. Sheath out 1120. Consider for a left femoral-popliteal bypass  Goal of Therapy:  Heparin level 0.3-0.7 units/ml Monitor platelets by anticoagulation protocol: Yes   Plan:  Resume IV heparin at 1930 at 1550 units/hr Will check heparin level 6 hrs after heparin resumed Daily HL and CBC Add treatment for elevated triglycerides?   Shelene Krage S. Alford Highland, PharmD, BCPS Clinical Staff Pharmacist Eilene Ghazi [place] 02/20/2019,1:08 PM

## 2019-02-20 NOTE — Progress Notes (Signed)
    5 Fr sheath was removed from the R F/A, and manual pressure was held for 17 min. Sterile gauze was applied at the site. R groin was soft and non tender/   Bed rest started at 1110 X 4 hr. Instructions were given to patient about this time.   HR 58 SR with PVC's  BP 144/84  sPO2 94 % on R/A

## 2019-02-20 NOTE — Progress Notes (Signed)
[facility]  Subjective  - No issues   Objective (!) 143/84 87 98 F (36.7 C) (Oral) 18 95%  Intake/Output Summary (Last 24 hours) at 02/20/2019 0926 Last data filed at 02/20/2019 [id number] Gross per 24 hour  Intake -  Output 1200 ml  Net -1200 ml    VASCULAR: Palpable bilateral femoral pulses.  No palpable popliteal or pedal pulses SKIN: Ulceration on the dorsum of the left great toe with surrounding discoloration consistent with ischemia.  No purulent drainage. Wound on right shin.  Laboratory Lab Results: Recent Labs    02/19/19 1149 02/20/19 0007  WBC 8.0 6.7  HGB 14.3 13.0  HCT 42.5 38.8*  PLT 154 145*   BMET Recent Labs    02/19/19 1149 02/20/19 0007  NA 140 136  K 2.9* 3.4*  CL 103 101  CO2 27 26  GLUCOSE 106* 105*  BUN 11 13  CREATININE 0.85 0.84  CALCIUM 9.1 8.6*    COAG Lab Results  Component Value Date   INR 1.0 02/19/2019   INR 1.0 03/13/2017   INR 0.92 11/19/2011   No results found for: PTT  Assessment/Planning:  Plan for aortogram with bilateral lower extremity arteriogram and possible intervention.  Today's focus will be on the left leg but we will get diagnostic imaging of both legs.  Marty Heck 02/20/2019 9:26 AM --

## 2019-02-20 NOTE — Op Note (Signed)
    Patient name: [name] [name] MRN: [id number] DOB: [birth year] Sex: male  02/20/2019 Pre-operative Diagnosis: Critical limb ischemia of the left lower extremity with tissue loss Post-operative diagnosis:  Same Surgeon:  Marty Heck, MD Procedure Performed: 1.  Ultrasound-guided access of the right common femoral artery 2.  Aortogram with bilateral lower extremity arteriogram 3.  20 minutes of monitored moderate conscious sedation time  Indications: Patient is a 58 year old male that was admitted yesterday from medicine clinic for evaluation of a left great toe wound in the setting of claudication history.  He was seen by Dr. Trula Slade.  He had ABIs that screen positive for PAD.  He is put on the OR schedule today for arteriogram of his bilateral lower extremities given the left great toe wound as well as a right shin wound.  Findings:   Aortogram showed approximate 50% stenosis of the right distal external proximal common femoral artery otherwise the rest of the aortoiliac segment was widely patent.  Left lower extremity arteriogram which is the side of interest showed a patent common femoral and profunda with a flush left SFA occlusion.  Ultimately he reconstituted his above-knee popliteal artery but then appeared to have a large calcified plaque behind the knee the below-knee popliteal artery was widely patent with single-vessel runoff via the anterior tibial artery.  Right lower extreme arteriogram showed a patent common femoral, profunda, SFA above and below-knee popliteal artery and single-vessel runoff via a anterior tibial that was very robust.  This did appear to occlude above the ankle with collaterals that filled the peroneal distally.   Procedure:  The patient was identified in the holding area and taken to room 8.  The patient was then placed supine on the table and prepped and draped in the usual sterile fashion.  A time out was called.  Ultrasound was used to evaluate the  right common femoral artery.  It was patent .  A digital ultrasound image was acquired.  A micropuncture needle was used to access the right common femoral artery under ultrasound guidance.  An 018 wire was advanced without resistance and a micropuncture sheath was placed.  The 018 wire was removed and a benson wire was placed.  The micropuncture sheath was exchanged for a 5 french sheath.  An omniflush catheter was advanced over the wire to the level of L-1.  An abdominal angiogram was obtained.  Next the catheter was pulled down and bilateral lower extremity runoff was obtained with pertinent findings noted above.  Given the left side was the side of interest with a flush SFA occlusion long segment and reconstitution of the popliteal target and single-vessel runoff via the anterior tibial to the ankle, I think he will be best served with a fem pop bypass.  That point time wires and catheters were removed.  I did get a access shot in the right groin and he has a lesion in the distal external proximal common femoral and I think he would best served with having his sheath removed in PACU and holding manual pressure.  Plan: Patient will be considered for a left femoral-popliteal bypass.  Vein mapping has been ordered.   Marty Heck, MD [facility] [place] Office: [phone] Pager: [phone]

## 2019-02-20 NOTE — Progress Notes (Addendum)
   Subjective:  [name] [name] was seen at bedside this morning. He states that he is doing okay, and that his toe is not as painful as yesterday. He also states it does not look as bad as yesterday. He understands that he is getting an aortogram. All questions and concerns were addressed.    Objective:  Vital signs in last 24 hours: Vitals:   02/19/19 1534 02/19/19 2101 02/20/19 0627  BP: (!) 120/91 121/88 (!) 143/84  Pulse: 88 90 87  Resp: 14 20 18   Temp:  97.8 F (36.6 C) 98 F (36.7 C)  TempSrc:  Oral Oral  SpO2: 98% 95% 95%   Physical Exam Constitutional:      General: He is not in acute distress.    Appearance: Normal appearance. He is not toxic-appearing.  HENT:     Head: Normocephalic and atraumatic.  Cardiovascular:     Rate and Rhythm: Normal rate and regular rhythm.     Heart sounds: No murmur. No gallop.   Pulmonary:     Effort: Pulmonary effort is normal. No respiratory distress.     Breath sounds: Normal breath sounds.  Musculoskeletal:     Comments: L greater toe erythematous in appearance, tender to palpation, with a nonbleeding/weeping ulcer.   Skin:    Findings: Lesion present.     Comments: L greater toe erythematous in appearance, tender to palpation, with a nonbleeding/weeping ulcer.   RLE has an ulceration which is bleeding and tender to palpation.   Neurological:     Mental Status: He is alert and oriented to person, place, and time.     Assessment/Plan:  Principal Problem:   Critical lower limb ischemia  Critical Limb Ischemia Left Leg with bilateral PAD:  - ABI in clinic with PAD. - Vascular surgery to do an abdominal aortogram today. We appreciate their assistance in Mr. Coriell health care management.  - Dilaudid Q3H PRN.  - Percocet when diet resumed -Atorvastatin 80mg  daily -heparin drip   Rheumatoid Arthritis:   - Rheumatology referral placed by PCP. - Continue Prednisone   Chronic Pain:  - Dilaudid Q3H PRN  - Percocet when diet  resumed   Hypokalemia:  - 3.4 - Potassium chloride 20 mg BID once.    CAD:  - Ordered Lipitor 80 mg  HTN: - Continue Amlodipine 10 mg QD  HLD: - Lipid Panel   - Triglycerides: 225  - HDL: 23  - VLDL: 45  - LDL: 81 - Lipitor 80 mg QD.   Dispo: Anticipated discharge pending medical course. Maudie Mercury, MD 02/20/2019, 8:33 AM Pager: [phone]

## 2019-02-20 NOTE — Progress Notes (Signed)
[place] for Heparin Indication: Critical Limb Ischemia  Allergies  Allergen Reactions  . Morphine And Related Rash  . Ramipril Rash    Per patient on R arm and leg only; no angioedema  . Tramadol Itching and Rash    Patient Measurements:   TBW 81.5 kg Height: 66 inches Heparin Dosing Weight: 80.3 kg   Vital Signs: Temp: 97.8 F (36.6 C) (08/26 2101) Temp Source: Oral (08/26 2101) BP: 121/88 (08/26 2101) Pulse Rate: 90 (08/26 2101)  Labs: Recent Labs    02/19/19 1149 02/19/19 1805 02/20/19 0007  HGB 14.3  --  13.0  HCT 42.5  --  38.8*  PLT 154  --  145*  LABPROT  --  13.2  --   INR  --  1.0  --   HEPARINUNFRC  --   --  0.12*  CREATININE 0.85  --   --     Estimated Creatinine Clearance: 95 mL/min (by C-G formula based on SCr of 0.85 mg/dL).   Medical History: Past Medical History:  Diagnosis Date  . Anemia   . Anemia 08/06/2012  . 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 Community acquired pneumonia 11/07/2011  . DDD (degenerative disc disease), lumbosacral     and grade 2 slip  . GERD (gastroesophageal reflux disease)   . Heart murmur   . History of anemia    no current med.  . Hypertension    states is borderline on medication; has been on med. x 5-6 yr.  . Hypokalemia 05/30/2018  . Impingement syndrome of shoulder region 10/2014   left  . Insomnia 06/01/2015  . Left shoulder pain 12/16/2015  . Low back pain without sciatica 10/29/2009  . Lupus ([place])   . Pneumonia   . RA (rheumatoid arthritis) ([place])   . Rotator cuff tear 11/04/2014    Assessment: 58 yr old male with hx of  HTN, tobacco use, HLD, CAD, and RA presented to IM clinic with 2 week of pain in left great toe. About 5 days ago, he developed bilateral claudication with calf pain on ambulation and intermittent numbness in left toes. In IM clinic, ABIs were performed showing severe bilateral PAD.  CBC WNL  8/27 AM update: Initial heparin level below goal No issues  per RN   Goal of Therapy:  Heparin level 0.3-0.5 units/mL Monitor platelets by anticoagulation protocol: Yes   Plan:  Heparin 2000 units [employer] heparin to 1300 units/hr Re-check heparin level in 6-8 hours  Narda Bonds, PharmD, [place] Pharmacist Phone: [phone]

## 2019-02-20 NOTE — Progress Notes (Deleted)
[place] for Heparin Indication: Critical Limb Ischemia  Allergies  Allergen Reactions  . Morphine And Related Rash  . Ramipril Rash    Per patient on R arm and leg only; no angioedema  . Tramadol Itching and Rash    Patient Measurements:   TBW 81.5 kg Height: 66 inches Heparin Dosing Weight: 80.3 kg   Vital Signs: Temp: 98 F (36.7 C) (08/27 0627) Temp Source: Oral (08/27 0627) BP: 143/84 (08/27 0627) Pulse Rate: 87 (08/27 0627)  Labs: Recent Labs    02/19/19 1149 02/19/19 1805 02/20/19 0007 02/20/19 0826  HGB 14.3  --  13.0  --   HCT 42.5  --  38.8*  --   PLT 154  --  145*  --   LABPROT  --  13.2  --   --   INR  --  1.0  --   --   HEPARINUNFRC  --   --  0.12* 0.19*  CREATININE 0.85  --  0.84  --     Estimated Creatinine Clearance: 96.1 mL/min (by C-G formula based on SCr of 0.84 mg/dL).   Medical History: Past Medical History:  Diagnosis Date  . Anemia   . Anemia 08/06/2012  . 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 Community acquired pneumonia 11/07/2011  . DDD (degenerative disc disease), lumbosacral     and grade 2 slip  . GERD (gastroesophageal reflux disease)   . Heart murmur   . History of anemia    no current med.  . Hypertension    states is borderline on medication; has been on med. x 5-6 yr.  . Hypokalemia 05/30/2018  . Impingement syndrome of shoulder region 10/2014   left  . Insomnia 06/01/2015  . Left shoulder pain 12/16/2015  . Low back pain without sciatica 10/29/2009  . Lupus ([place])   . Pneumonia   . RA (rheumatoid arthritis) ([place])   . Rotator cuff tear 11/04/2014    Assessment: 58 yr old male with hx of  HTN, tobacco use, HLD, CAD, and RA presented to IM clinic with 2 week of pain in left great toe. About 5 days ago, he developed bilateral claudication with calf pain on ambulation and intermittent numbness in left toes. In IM clinic, ABIs were performed showing severe bilateral PAD.  CBC WNL  8/27 AM  update: second heparin level below goal 0.19 No issues per RN   Goal of Therapy:  Heparin level 0.3-0.5 units/mL Monitor platelets by anticoagulation protocol: Yes   Plan:  Heparin 2000 units re-[employer] heparin to 1550 units/hr Re-check heparin level in 8 hours ?Treatment for TG  Gwenlyn Fudge - Student PharmD

## 2019-02-21 ENCOUNTER — Encounter (HOSPITAL_COMMUNITY): Payer: Self-pay | Admitting: Vascular Surgery

## 2019-02-21 ENCOUNTER — Inpatient Hospital Stay (HOSPITAL_COMMUNITY): Payer: Medicare Other

## 2019-02-21 DIAGNOSIS — Z01810 Encounter for preprocedural cardiovascular examination: Secondary | ICD-10-CM

## 2019-02-21 DIAGNOSIS — I739 Peripheral vascular disease, unspecified: Secondary | ICD-10-CM

## 2019-02-21 DIAGNOSIS — Z720 Tobacco use: Secondary | ICD-10-CM

## 2019-02-21 DIAGNOSIS — I2510 Atherosclerotic heart disease of native coronary artery without angina pectoris: Secondary | ICD-10-CM

## 2019-02-21 DIAGNOSIS — I340 Nonrheumatic mitral (valve) insufficiency: Secondary | ICD-10-CM

## 2019-02-21 LAB — CBC
HCT: 36.6 % — ABNORMAL LOW (ref 39.0–52.0)
Hemoglobin: 12 g/dL — ABNORMAL LOW (ref 13.0–17.0)
MCH: 28.8 pg (ref 26.0–34.0)
MCHC: 32.8 g/dL (ref 30.0–36.0)
MCV: 88 fL (ref 80.0–100.0)
Platelets: 127 10*3/uL — ABNORMAL LOW (ref 150–400)
RBC: 4.16 MIL/uL — ABNORMAL LOW (ref 4.22–5.81)
RDW: 15.1 % (ref 11.5–15.5)
WBC: 4.6 10*3/uL (ref 4.0–10.5)
nRBC: 0 % (ref 0.0–0.2)

## 2019-02-21 LAB — BASIC METABOLIC PANEL
Anion gap: 9 (ref 5–15)
BUN: 8 mg/dL (ref 6–20)
CO2: 25 mmol/L (ref 22–32)
Calcium: 8.8 mg/dL — ABNORMAL LOW (ref 8.9–10.3)
Chloride: 105 mmol/L (ref 98–111)
Creatinine, Ser: 0.78 mg/dL (ref 0.61–1.24)
GFR calc Af Amer: >60 mL/min (ref >60)
GFR calc non Af Amer: >60 mL/min (ref >60)
Glucose, Bld: 118 mg/dL — ABNORMAL HIGH (ref 70–99)
Potassium: 3.6 mmol/L (ref 3.5–5.1)
Sodium: 139 mmol/L (ref 135–145)

## 2019-02-21 LAB — HEPARIN LEVEL (UNFRACTIONATED)
Heparin Unfractionated: 0.15 IU/mL — ABNORMAL LOW (ref 0.30–0.70)
Heparin Unfractionated: 0.36 IU/mL (ref 0.30–0.70)
Heparin Unfractionated: 0.37 IU/mL (ref 0.30–0.70)

## 2019-02-21 MED ORDER — HEPARIN (PORCINE) 25000 UT/250ML-% IV SOLN
1700.0000 [IU]/h | INTRAVENOUS | Status: DC
  Administered 2019-02-21 – 2019-02-23 (×4): 1750 [IU]/h via INTRAVENOUS
  Administered 2019-02-24 – 2019-02-26 (×4): 1700 [IU]/h via INTRAVENOUS
  Filled 2019-02-21 (×9): qty 250

## 2019-02-21 NOTE — Progress Notes (Signed)
[place] for Heparin Indication: Critical Limb Ischemia  Allergies  Allergen Reactions  . Morphine And Related Rash  . Ramipril Rash    Per patient on R arm and leg only; no angioedema  . Tramadol Itching and Rash    Patient Measurements: Height: 5\' 6"  (167.6 cm) Weight: 179 lb 7.3 oz (81.4 kg) IBW/kg (Calculated) : 63.8 TBW 81.5 kg Height: 66 inches Heparin Dosing Weight: 80.3 kg   Vital Signs: Temp: 97.7 F (36.5 C) (08/28 1313) Temp Source: Oral (08/28 1313) BP: 137/99 (08/28 1313) Pulse Rate: 87 (08/28 1313)  Labs: Recent Labs    02/19/19 1149 02/19/19 1805 02/20/19 0007  02/21/19 0150 02/21/19 0359 02/21/19 0657 02/21/19 1036 02/21/19 1720  HGB 14.3  --  13.0  --   --  12.0*  --   --   --   HCT 42.5  --  38.8*  --   --  36.6*  --   --   --   PLT 154  --  145*  --   --  127*  --   --   --   LABPROT  --  13.2  --   --   --   --   --   --   --   INR  --  1.0  --   --   --   --   --   --   --   HEPARINUNFRC  --   --  0.12*   < > 0.15*  --   --  0.36 0.37  CREATININE 0.85  --  0.84  --   --   --  0.78  --   --    < > = values in this interval not displayed.    Estimated Creatinine Clearance: 100.8 mL/min (by C-G formula based on SCr of 0.78 mg/dL).   Medical History: Past Medical History:  Diagnosis Date  . Anemia   . Anemia 08/06/2012  . 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 Community acquired pneumonia 11/07/2011  . DDD (degenerative disc disease), lumbosacral     and grade 2 slip  . GERD (gastroesophageal reflux disease)   . Heart murmur   . History of anemia    no current med.  . Hypertension    states is borderline on medication; has been on med. x 5-6 yr.  . Hypokalemia 05/30/2018  . Impingement syndrome of shoulder region 10/2014   left  . Insomnia 06/01/2015  . Left shoulder pain 12/16/2015  . Low back pain without sciatica 10/29/2009  . Lupus ([place])   . Peripheral vascular disease ([place])   . Pneumonia   . RA  (rheumatoid arthritis) ([place])   . Rotator cuff tear 11/04/2014    Assessment: 58 yr old male with hx of  HTN, tobacco use, HLD, CAD, and RA presented to IM clinic with 2 week of pain in left great toe. About 5 days ago, he developed bilateral claudication with calf pain on ambulation and intermittent numbness in left toes. In IM clinic, ABIs were performed showing severe bilateral PAD.  Heparin remains therapeutic, no changes indicated.  Goal of Therapy:  Heparin level 0.3-0.5 units/mL Monitor platelets by anticoagulation protocol: Yes   Plan:  Continue heparin 1750 units/h Daily heparin level and CBC   Arrie Senate, PharmD, BCPS Clinical Pharmacist [phone] Please check AMION for all [place] numbers 02/21/2019

## 2019-02-21 NOTE — Progress Notes (Signed)
[facility] for Heparin Indication: Critical Limb Ischemia  Allergies  Allergen Reactions  . Morphine And Related Rash  . Ramipril Rash    Per patient on R arm and leg only; no angioedema  . Tramadol Itching and Rash    Patient Measurements: Height: 5\' 6"  (167.6 cm) Weight: 179 lb 7.3 oz (81.4 kg) IBW/kg (Calculated) : 63.8 TBW 81.5 kg Height: 66 inches Heparin Dosing Weight: 80.3 kg   Vital Signs: Temp: 98 F (36.7 C) (08/28 0411) Temp Source: Oral (08/28 0411) BP: 127/87 (08/28 0411) Pulse Rate: 84 (08/28 0411)  Labs: Recent Labs    02/19/19 1149 02/19/19 1805  02/20/19 0007 02/20/19 0826 02/21/19 0150 02/21/19 0359 02/21/19 0657 02/21/19 1036  HGB 14.3  --   --  13.0  --   --  12.0*  --   --   HCT 42.5  --   --  38.8*  --   --  36.6*  --   --   PLT 154  --   --  145*  --   --  127*  --   --   LABPROT  --  13.2  --   --   --   --   --   --   --   INR  --  1.0  --   --   --   --   --   --   --   HEPARINUNFRC  --   --    < > 0.12* 0.19* 0.15*  --   --  0.36  CREATININE 0.85  --   --  0.84  --   --   --  0.78  --    < > = values in this interval not displayed.    Estimated Creatinine Clearance: 100.8 mL/min (by C-G formula based on SCr of 0.78 mg/dL).   Medical History: Past Medical History:  Diagnosis Date  . Anemia   . Anemia 08/06/2012  . 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 Community acquired pneumonia 11/07/2011  . DDD (degenerative disc disease), lumbosacral     and grade 2 slip  . GERD (gastroesophageal reflux disease)   . Heart murmur   . History of anemia    no current med.  . Hypertension    states is borderline on medication; has been on med. x 5-6 yr.  . Hypokalemia 05/30/2018  . Impingement syndrome of shoulder region 10/2014   left  . Insomnia 06/01/2015  . Left shoulder pain 12/16/2015  . Low back pain without sciatica 10/29/2009  . Lupus ([place])   . Peripheral vascular disease ([place])   . Pneumonia   . RA  (rheumatoid arthritis) ([place])   . Rotator cuff tear 11/04/2014    Assessment: 58 yr old male with hx of  HTN, tobacco use, HLD, CAD, and RA presented to IM clinic with 2 week of pain in left great toe. About 5 days ago, he developed bilateral claudication with calf pain on ambulation and intermittent numbness in left toes. In IM clinic, ABIs were performed showing severe bilateral PAD.  CBC WNL  Heparin level now at goal on 1750 units/hr, no overt bleeding or complications noted.  Goal of Therapy:  Heparin level 0.3-0.5 units/mL Monitor platelets by anticoagulation protocol: Yes   Plan:  Continue IV heparin at current rate. Recheck heparin level in 6 hrs Daily heparin level and CBC.  Marguerite Olea, [facility] Clinical Pharmacist Phone [phone]  02/21/2019 11:45 AM

## 2019-02-21 NOTE — Consult Note (Signed)
Cardiology Consultation:   Patient ID: [name] [name] MRN: [id number]; DOB: [birth year]  Admit date: 02/19/2019 Date of Consult: 02/21/2019  Primary Care Provider: Jean Rosenthal, MD Primary Cardiologist: Ena Dawley, MD  Primary Electrophysiologist:  None    Patient Profile:   [name] [name] is a 58 y.o. male with a hx of nonobstructive coronary artery disease who is being seen today for the evaluation of preoperative cardiovascular risk prior to femoropopliteal at the request of Dr. Carlis Abbott.  History of Present Illness:   [name] [name] is a 58 year old male with nonobstructive coronary artery disease by cardiac catheterization in 2018 here with severe peripheral vascular disease requiring femoropopliteal bypass.  He has had mild to moderate aortic valve regurgitation in the past with an echocardiogram back in 2013 suspicious for Libman-Sacks endocarditis.  Denies any prior stroke.  He is also had rheumatologic issues, pain in the past, rheumatoid arthritis.  On prednisone.  Chronic pain.  Smoker.  Had quit for a while but he is back to smoking a few cigarettes a day.  Critical left limb ischemia noteworthy.  Toe pain.  Denies any anginal symptoms.  Always states that he has some shortness of breath with activity but this is been going on for years.  No recent fevers.  Heart Pathway Score:     Past Medical History:  Diagnosis Date  . Anemia   . Anemia 08/06/2012  . 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 Community acquired pneumonia 11/07/2011  . DDD (degenerative disc disease), lumbosacral     and grade 2 slip  . GERD (gastroesophageal reflux disease)   . Heart murmur   . History of anemia    no current med.  . Hypertension    states is borderline on medication; has been on med. x 5-6 yr.  . Hypokalemia 05/30/2018  . Impingement syndrome of shoulder region 10/2014   left  . Insomnia 06/01/2015  . Left shoulder pain 12/16/2015  . Low back pain without sciatica 10/29/2009  . Lupus ([place])   .  Peripheral vascular disease ([place])   . Pneumonia   . RA (rheumatoid arthritis) ([place])   . Rotator cuff tear 11/04/2014    Past Surgical History:  Procedure Laterality Date  . ABDOMINAL AORTAGRAM  02/20/2019  . ABDOMINAL AORTOGRAM W/LOWER EXTREMITY N/A 02/20/2019   Procedure: ABDOMINAL AORTOGRAM W/LOWER EXTREMITY;  Surgeon: Marty Heck, MD;  Location: [facility];  Service: Cardiovascular;  Laterality: N/A;  . ACHILLES TENDON SURGERY Bilateral   . LEFT HEART CATH AND CORONARY ANGIOGRAPHY N/A 03/15/2017   Procedure: LEFT HEART CATH AND CORONARY ANGIOGRAPHY;  Surgeon: Nelva Bush, MD;  Location: [facility];  Service: Cardiovascular;  Laterality: N/A;  . OSTEOTOMY AND ULNAR SHORTENING Right 07/22/2002  . SHOULDER ARTHROSCOPY WITH BICEPSTENOTOMY Right 03/11/2015   Procedure: SHOULDER ARTHROSCOPY WITH BICEPSTENOTOMY;  Surgeon: Ninetta Lights, MD;  Location: [facility];  Service: Orthopedics;  Laterality: Right;  . SHOULDER ARTHROSCOPY WITH DISTAL CLAVICLE RESECTION Left 11/19/2014   Procedure: SHOULDER ARTHROSCOPY WITH DISTAL CLAVICLE RESECTION;  Surgeon: Kathryne Hitch, MD;  Location: [facility];  Service: Orthopedics;  Laterality: Left;  . SHOULDER ARTHROSCOPY WITH ROTATOR CUFF REPAIR AND SUBACROMIAL DECOMPRESSION Left 11/19/2014   Procedure: LEFT SHOULDER ARTHROSCOPY, DEBRIDEMENT DISTAL CLAVICLE EXCISION, ACROMIOPLASTY WITH ROTATOR CUFF REPAIR ;  Surgeon: Kathryne Hitch, MD;  Location: [facility];  Service: Orthopedics;  Laterality: Left;  . TRANSFORAMINAL LUMBAR INTERBODY FUSION (TLIF) WITH PEDICLE SCREW FIXATION 1 LEVEL  N/A 01/03/2018   Procedure: TRANSFORAMINAL LUMBAR INTERBODY FUSION (TLIF) LUMBAR FIVE-SACRAL ONE;  Surgeon: Melina Schools, MD;  Location: [facility];  Service: Orthopedics;  Laterality: N/A;  . VIDEO ASSISTED THORACOSCOPY (VATS)/DECORTICATION Right 11/21/2011   drainage of empyema     Home Medications:  Prior to  Admission medications   Medication Sig Start Date End Date Taking? Authorizing Provider  amLODipine (NORVASC) 10 MG tablet TAKE 1 TABLET (10 MG TOTAL) BY MOUTH DAILY. 02/18/18  Yes Agyei, Caprice Kluver, MD  chlorthalidone (HYGROTON) 25 MG tablet TAKE 1 TABLET BY MOUTH EVERY DAY Patient taking differently: Take 25 mg by mouth daily.  12/10/18  Yes Agyei, Caprice Kluver, MD  predniSONE (DELTASONE) 5 MG tablet TAKE 1 TABLET BY MOUTH EVERY DAY Patient taking differently: Take 5 mg by mouth daily with breakfast.  12/02/18  Yes Agyei, Caprice Kluver, MD  Glycerin-Polysorbate 80 (REFRESH DRY EYE THERAPY OP) Place 1 drop into both eyes 3 (three) times daily as needed (dry/irritated eyes.).    [provider]  naloxone [facility]) 2 MG/2ML injection Inject 2 mg into the muscle once as needed (opiod overdose (call 911, inject intramuscularly in shoulder or thigh. Repeat every 3 minutes)).     [provider]  oxyCODONE-acetaminophen (PERCOCET) 10-325 MG tablet Take 1 tablet by mouth 5 (five) times daily.    [provider]    Inpatient Medications: Scheduled Meds: . amLODipine  10 mg Oral Daily  . atorvastatin  80 mg Oral q1800  . chlorthalidone  25 mg Oral Daily  . nicotine  21 mg Transdermal Daily  . predniSONE  5 mg Oral Daily  . sodium chloride flush  3 mL Intravenous Q12H  . sodium chloride flush  3 mL Intravenous Q12H   Continuous Infusions: . sodium chloride    . heparin 1,750 Units/hr (02/21/19 1044)   PRN Meds: sodium chloride, acetaminophen, hydrALAZINE, HYDROmorphone (DILAUDID) injection, labetalol, ondansetron (ZOFRAN) IV, sodium chloride flush  Allergies:    Allergies  Allergen Reactions  . Morphine And Related Rash  . Ramipril Rash    Per patient on R arm and leg only; no angioedema  . Tramadol Itching and Rash    Social History:   Social History   Socioeconomic History  . Marital status: Single    Spouse name: Not on file  . Number of children: Not on file  . Years of  education: Not on file  . Highest education level: Not on file  Occupational History  . Not on file  Social Needs  . Financial resource strain: Not on file  . Food insecurity    Worry: Not on file    Inability: Not on file  . Transportation needs    Medical: Not on file    Non-medical: Not on file  Tobacco Use  . Smoking status: Former Smoker    Packs/day: 2.00    Years: 20.00    Pack years: 40.00    Types: Cigarettes    Quit date: 02/19/2019  . Smokeless tobacco: Never Used  . Tobacco comment: 2-3 packs per day  Substance and Sexual Activity  . Alcohol use: No    Alcohol/week: 0.0 standard drinks    Comment: sober 1998  . Drug use: No  . Sexual activity: Not on file  Lifestyle  . Physical activity    Days per week: Not on file    Minutes per session: Not on file  . Stress: Not on file  Relationships  . Social connections  Talks on phone: Not on file    Gets together: Not on file    Attends religious service: Not on file    Active member of club or organization: Not on file    Attends meetings of clubs or organizations: Not on file    Relationship status: Not on file  . Intimate partner violence    Fear of current or ex partner: Not on file    Emotionally abused: Not on file    Physically abused: Not on file    Forced sexual activity: Not on file  Other Topics Concern  . Not on file  Social History Narrative   Patient lives by himself, is on disability since 1992, used to work with city of [place]. 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 Smokes 1/2 PPD and has been smoking since 25 years. Quit drinking in 1990.    Family History:    Family History  Problem Relation Age of Onset  . Heart attack Father 71  . Stroke Neg Hx   . Cancer Neg Hx      ROS:  Please see the history of present illness.  No fevers chills nausea vomiting syncope bleeding All other ROS reviewed and negative.     Physical Exam/Data:   Vitals:   02/20/19 1428 02/20/19 1953 02/21/19 0411  02/21/19 1313  BP: 136/80 115/87 127/87 (!) 137/99  Pulse: 90 87 84 87  Resp: 18 17 17 18   Temp: 97.8 F (36.6 C) 97.8 F (36.6 C) 98 F (36.7 C) 97.7 F (36.5 C)  TempSrc: Oral Oral Oral Oral  SpO2: 93% 95% 97% 96%  Weight: 81.4 kg     Height: 5\' 6"  (1.676 m)       Intake/Output Summary (Last 24 hours) at 02/21/2019 1706 Last data filed at 02/21/2019 1700 Gross per 24 hour  Intake 828.67 ml  Output 1550 ml  Net -721.33 ml   Last 3 Weights 02/20/2019 02/19/2019 06/17/2018  Weight (lbs) 179 lb 7.3 oz 179 lb 9.6 oz 184 lb 11.9 oz  Weight (kg) 81.4 kg 81.466 kg 83.8 kg     Body mass index is 28.96 kg/m.  General:  Well nourished, well developed, in no acute distress HEENT: normal Lymph: no adenopathy Neck: no JVD Endocrine:  No thryomegaly Vascular: No carotid bruits; FA pulses 2+ bilaterally without bruits  Cardiac:  normal S1, S2; RRR; very soft diastolic murmur in aortic position Lungs:  clear to auscultation bilaterally, no wheezing, rhonchi or rales  Abd: soft, nontender, no hepatomegaly  Ext: no edema, unable to palpate left pedal pulses Musculoskeletal:  No deformities, BUE and BLE strength normal and equal Skin: warm and dry  Neuro:  CNs 2-12 intact, no focal abnormalities noted Psych:  Normal affect   EKG:  The EKG was personally reviewed and demonstrates: This rhythm nonspecific ST-T wave changes, occasional PVCs Telemetry:  Telemetry was personally reviewed and demonstrates: Sinus rhythm with occasional PVCs  Relevant CV Studies:  ECHO 2017: - Left ventricle: The cavity size was normal. There was moderate   concentric hypertrophy. Systolic function was normal. The   estimated ejection fraction was in the range of 60% to 65%. Wall   motion was normal; there were no regional wall motion   abnormalities. Doppler parameters are consistent with abnormal   left ventricular relaxation (grade 1 diastolic dysfunction).   Doppler parameters are consistent with  indeterminate ventricular   filling pressure. - Aortic valve: There was mild to moderate regurgitation. The   hyperechoic lesion  on the R coronary cusp of the aortic valve is   1.0 cm x 0.87 cm. Slightly larger than in 10/2011 (7.5 cm x 7.5   cm) Regurgitation pressure half-time: 244 ms. Aortic valve   regurgitation appears mild visually, but is moderate by pressure   half-time. - Mitral valve: Transvalvular velocity was within the normal range.   There was no evidence for stenosis. There was no regurgitation. - Right ventricle: The cavity size was normal. Wall thickness was   normal. Systolic function was normal. - Atrial septum: No defect or patent foramen ovale was identified   by color flow Doppler. - Tricuspid valve: There was trivial regurgitation. - Pulmonary arteries: Systolic pressure was within the normal   range. PA peak pressure: 28 mm Hg (S).  Laboratory Data:  High Sensitivity Troponin:  No results for input(s): TROPONINIHS in the last 720 hours.   Chemistry Recent Labs  Lab 02/19/19 1149 02/20/19 0007 02/21/19 0657  NA 140 136 139  K 2.9* 3.4* 3.6  CL 103 101 105  CO2 27 26 25   GLUCOSE 106* 105* 118*  BUN 11 13 8   CREATININE 0.85 0.84 0.78  CALCIUM 9.1 8.6* 8.8*  GFRNONAA >60 >60 >60  GFRAA >60 >60 >60  ANIONGAP 10 9 9     Recent Labs  Lab 02/19/19 1149  PROT 6.8  ALBUMIN 3.3*  AST 17  ALT 27  ALKPHOS 83  BILITOT 0.8   Hematology Recent Labs  Lab 02/19/19 1149 02/20/19 0007 02/21/19 0359  WBC 8.0 6.7 4.6  RBC 4.85 4.43 4.16*  HGB 14.3 13.0 12.0*  HCT 42.5 38.8* 36.6*  MCV 87.6 87.6 88.0  MCH 29.5 29.3 28.8  MCHC 33.6 33.5 32.8  RDW 15.2 15.1 15.1  PLT 154 145* 127*   BNPNo results for input(s): BNP, PROBNP in the last 168 hours.  DDimer No results for input(s): DDIMER in the last 168 hours.   Radiology/Studies:  Vas [place] Lower Extremity Saphenous Vein Mapping  Result Date: 02/21/2019 LOWER EXTREMITY VEIN MAPPING Indications:  Preop  for left fem distal bypass. Risk Factors: Hyperlipidemia, current smoker, PAD.  Comparison Study: No prior. Performing Technologist: Oda Cogan RDMS, RVT  Examination Guidelines: A complete evaluation includes B-mode imaging, spectral Doppler, color Doppler, and power Doppler as needed of all accessible portions of each vessel. Bilateral testing is considered an integral part of a complete examination. Limited examinations for reoccurring indications may be performed as noted. +---------------+-----------+----------------------+---------------+-----------+   RT Diameter  RT Findings         GSV            LT Diameter  LT Findings      (cm)                                            (cm)                  +---------------+-----------+----------------------+---------------+-----------+      0.60                     Saphenofemoral         0.61  Junction                                  +---------------+-----------+----------------------+---------------+-----------+      0.41                     Proximal thigh         0.49                  +---------------+-----------+----------------------+---------------+-----------+      0.21       branching       Mid thigh            0.20       branching  +---------------+-----------+----------------------+---------------+-----------+      0.28       branching      Distal thigh          0.32                  +---------------+-----------+----------------------+---------------+-----------+      0.29                          Knee              0.36                  +---------------+-----------+----------------------+---------------+-----------+      0.31                       Prox calf            0.34                  +---------------+-----------+----------------------+---------------+-----------+      0.33                        Mid calf            0.23                   +---------------+-----------+----------------------+---------------+-----------+      0.34                      Distal calf           0.33       branching  +---------------+-----------+----------------------+---------------+-----------+      0.35                         Ankle              0.33                  +---------------+-----------+----------------------+---------------+-----------+ Diagnosing physician: Deitra Mayo MD Electronically signed by Deitra Mayo MD on 02/21/2019 at 4:39:56 PM.    Final     Assessment and Plan:   Preoperative risk assessment - He may proceed with moderate risk from a cardiovascular perspective for a left femoral-popliteal bypass. - Cardiac catheterization was performed on 03/15/2017 which demonstrated mild to moderate nonobstructive CAD involving the left circumflex and right coronary artery.  Normal left ventricular contraction and filling pressures were noted at that time.  No PCI, medical management. -He has not had any escalation of anginal symptoms.  No adverse arrhythmias such as ventricular tachycardia.  No acute valvular lesions, chronic mild to moderate aortic valve regurgitation previously noted. -I will go  ahead and repeat echocardiogram to reassess his aortic valve. -Continue with high intensity statin.  Mild to moderate aortic valve regurgitation -Repeating echocardiogram.  Severe peripheral arterial disease - Per vascular team. -Instructed him on the importance of tobacco cessation.      For questions or updates, please contact [place] Please consult [email or web address] for contact info under     Signed, Candee Furbish, MD  02/21/2019 5:06 PM

## 2019-02-21 NOTE — Care Management Important Message (Signed)
Important Message  Patient Details  Name: [name] [name] MRN: [id number] Date of Birth: [birth year]   Medicare Important Message Given:  Yes     [name] 02/21/2019, 1:01 PM

## 2019-02-21 NOTE — Progress Notes (Addendum)
  Progress Note    02/21/2019 8:24 AM 1 Day Post-Op  Subjective:  No complaints this morning  afebrile  Vitals:   02/20/19 1953 02/21/19 0411  BP: 115/87 127/87  Pulse: 87 84  Resp: 17 17  Temp: 97.8 F (36.6 C) 98 F (36.7 C)  SpO2: 95% 97%    Physical Exam: General:  No distress Lungs:  Non labored Incisions:  Right groin soft without hematoma   CBC    Component Value Date/Time   WBC 4.6 02/21/2019 0359   RBC 4.16 (L) 02/21/2019 0359   HGB 12.0 (L) 02/21/2019 0359   HGB 13.9 03/13/2017 1032   HCT 36.6 (L) 02/21/2019 0359   HCT 41.6 03/13/2017 1032   PLT 127 (L) 02/21/2019 0359   PLT 204 03/13/2017 1032   MCV 88.0 02/21/2019 0359   MCV 84 03/13/2017 1032   MCH 28.8 02/21/2019 0359   MCHC 32.8 02/21/2019 0359   RDW 15.1 02/21/2019 0359   RDW 13.7 03/13/2017 1032   LYMPHSABS 1.6 02/19/2019 1149   LYMPHSABS 1.8 03/13/2017 1032   MONOABS 0.4 02/19/2019 1149   EOSABS 0.1 02/19/2019 1149   EOSABS 0.2 03/13/2017 1032   BASOSABS 0.1 02/19/2019 1149   BASOSABS 0.0 03/13/2017 1032    BMET    Component Value Date/Time   NA 139 02/21/2019 0657   NA 140 05/30/2018 1413   K 3.6 02/21/2019 0657   CL 105 02/21/2019 0657   CO2 25 02/21/2019 0657   GLUCOSE 118 (H) 02/21/2019 0657   BUN 8 02/21/2019 0657   BUN 9 05/30/2018 1413   CREATININE 0.78 02/21/2019 0657   CREATININE 0.80 11/27/2012 0956   CALCIUM 8.8 (L) 02/21/2019 0657   GFRNONAA >60 02/21/2019 0657   GFRNONAA >89 11/27/2012 0956   GFRAA >60 02/21/2019 0657   GFRAA >89 11/27/2012 0956    INR    Component Value Date/Time   INR 1.0 02/19/2019 1805     Intake/Output Summary (Last 24 hours) at 02/21/2019 0824 Last data filed at 02/21/2019 0600 Gross per 24 hour  Intake 468.67 ml  Output 1075 ml  Net -606.33 ml     Assessment:  58 y.o. male is s/p:  1.  Ultrasound-guided access of the right common femoral artery 2.  Aortogram with bilateral lower extremity arteriogram 3.  20 minutes of  monitored moderate conscious sedation time  1 Day Post-Op  Plan: -pt doing well this am-right groin without hematoma -discussed with pt bypass grafting and vein mapping ordered -Dr.Brabham to see pt later today and he will discuss timing of surgery with the pt.     Leontine Locket, PA-C [facility] [phone] 02/21/2019 8:24 AM  I agree with the above.  I have seen and evaluated the patient.  He had angiography yesterday which was reveals flush occlusion of his left superficial femoral artery.  He will need a left femoral to below-knee popliteal artery bypass graft.  He has a marginal saphenous vein which will need to be evaluated in the operating room.  I have tentatively placed him on the OR schedule for either Tuesday or Wednesday of next week.  I have asked cardiology to evaluate him for cardiac clearance.  I will also obtain preoperative carotid duplex studies.  Dr. Scot Dock will check on the patient Sunday.  Continue high-dose statin therapy.  Annamarie Major

## 2019-02-21 NOTE — Progress Notes (Signed)
Subjective:  Pt seen at the bedside on AM rounds today. Mr. Wantz reports his toe is feeling a bit better, but still sore. Dilaudid relieves the pain for about 3 hours before it starts throbbing again. We reminded him that his pain regimen is scheduled q3hr PRN, and all he needs to do is ask for it if he is hurting. He voiced understanding. He says he had a bowel movement this morning. Denies appetite change.  We discussed that his foot/leg pain is secondary to block vessels and we are coordinating care with Vascular. The plan is to proceed with vein mapping and possible graft. Discussed that Dilaudid is q3h PRN but that requires he ask his nurse every 3 hours. Patient expressed understanding. All questions and concerns were addressed.   Objective:  Vital signs in last 24 hours: Vitals:   02/20/19 1127 02/20/19 1428 02/20/19 1953 02/21/19 0411  BP: (!) 131/93 136/80 115/87 127/87  Pulse: 76 90 87 84  Resp:  18 17 17   Temp:  97.8 F (36.6 C) 97.8 F (36.6 C) 98 F (36.7 C)  TempSrc: Temporal Oral Oral Oral  SpO2:  93% 95% 97%  Weight:  81.4 kg    Height:  5\' 6"  (1.676 m)     Physical Exam Vitals signs reviewed.  Constitutional:      General: He is not in acute distress.    Appearance: Normal appearance. He is not ill-appearing or toxic-appearing.  HENT:     Head: Normocephalic and atraumatic.  Eyes:     General: No scleral icterus.       Right eye: No discharge.        Left eye: No discharge.     Extraocular Movements: Extraocular movements intact.  Cardiovascular:     Rate and Rhythm: Normal rate and regular rhythm.     Heart sounds: Normal heart sounds. No murmur. No friction rub. No gallop.      Comments: Dorsalis pedis pulses diminished bilaterally.  Pulmonary:     Effort: Pulmonary effort is normal. No respiratory distress.     Breath sounds: Normal breath sounds. No wheezing or rales.  Abdominal:     General: Bowel sounds are normal. There is no distension.   Palpations: Abdomen is soft.     Tenderness: There is no abdominal tenderness. There is no guarding.  Musculoskeletal:     Comments: L big toe cool to the touch  Neurological:     General: No focal deficit present.     Mental Status: He is alert and oriented to person, place, and time. Mental status is at baseline.  Psychiatric:        Mood and Affect: Mood normal.        Behavior: Behavior normal.    Assessment/Plan:  Principal Problem:   Critical lower limb ischemia Active Problems:   PAD (peripheral artery disease) (HCC)   Ischemic ulcer of toe of left foot (HCC)  In summary, Mr. Simoes is a 58 year old male with a history of HTN, HLD, CAD, PAD, and tobacco abuse presented with LLL ischemia.  CAD HLD Critical Limb Ischemia Left Leg with bilateral PAD: - S/p abdominal aortogram yesterday with [facility]. Scheduled for vein mapping today with the plan for potential L femoral popliteal bypass. Will continue to appreciate recommendations form Vascular Surgery -Dilaudid Q3H PRN -Atorvastatin 80mg  daily -Heparin drip  Rheumatoid Arthritis:   - Rheumatology referralplaced by PCP. - Continue Prednisone  Chronic Pain:  -Dilaudid Q3H PRN  - Percocet  when diet resumed  Hypokalemia:  - 3.4 - Potassium chloride 20 mg BID once.   HTN: -Amlodipine 10 mg daily  Dispo: Anticipated discharge pending medical course  Earlene Plater, MD Internal Medicine, PGY1 Pager: [phone]  02/21/2019,2:04 PM

## 2019-02-21 NOTE — Progress Notes (Signed)
[facility] for Heparin Indication: Critical Limb Ischemia  Allergies  Allergen Reactions  . Morphine And Related Rash  . Ramipril Rash    Per patient on R arm and leg only; no angioedema  . Tramadol Itching and Rash    Patient Measurements: Height: 5\' 6"  (167.6 cm) Weight: 179 lb 7.3 oz (81.4 kg) IBW/kg (Calculated) : 63.8 TBW 81.5 kg Height: 66 inches Heparin Dosing Weight: 80.3 kg   Vital Signs: Temp: 97.8 F (36.6 C) (08/27 1953) Temp Source: Oral (08/27 1953) BP: 115/87 (08/27 1953) Pulse Rate: 87 (08/27 1953)  Labs: Recent Labs    02/19/19 1149 02/19/19 1805 02/20/19 0007 02/20/19 0826 02/21/19 0150  HGB 14.3  --  13.0  --   --   HCT 42.5  --  38.8*  --   --   PLT 154  --  145*  --   --   LABPROT  --  13.2  --   --   --   INR  --  1.0  --   --   --   HEPARINUNFRC  --   --  0.12* 0.19* 0.15*  CREATININE 0.85  --  0.84  --   --     Estimated Creatinine Clearance: 96 mL/min (by C-G formula based on SCr of 0.84 mg/dL).   Medical History: Past Medical History:  Diagnosis Date  . Anemia   . Anemia 08/06/2012  . 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 Community acquired pneumonia 11/07/2011  . DDD (degenerative disc disease), lumbosacral     and grade 2 slip  . GERD (gastroesophageal reflux disease)   . Heart murmur   . History of anemia    no current med.  . Hypertension    states is borderline on medication; has been on med. x 5-6 yr.  . Hypokalemia 05/30/2018  . Impingement syndrome of shoulder region 10/2014   left  . Insomnia 06/01/2015  . Left shoulder pain 12/16/2015  . Low back pain without sciatica 10/29/2009  . Lupus ([place])   . Peripheral vascular disease ([place])   . Pneumonia   . RA (rheumatoid arthritis) ([place])   . Rotator cuff tear 11/04/2014    Assessment: 58 yr old male with hx of  HTN, tobacco use, HLD, CAD, and RA presented to IM clinic with 2 week of pain in left great toe. About 5 days ago, he developed  bilateral claudication with calf pain on ambulation and intermittent numbness in left toes. In IM clinic, ABIs were performed showing severe bilateral PAD.  CBC WNL  8/27 AM update: Heparin level below goal after re-start s/p aortogram No issues per RN  Goal of Therapy:  Heparin level 0.3-0.5 units/mL Monitor platelets by anticoagulation protocol: Yes   Plan:  Inc heparin to 1750 units/hr Re-check heparin level in 6-8 hours  Narda Bonds, PharmD, [place] Pharmacist Phone: [phone]

## 2019-02-22 ENCOUNTER — Inpatient Hospital Stay (HOSPITAL_COMMUNITY): Payer: Medicare Other

## 2019-02-22 DIAGNOSIS — I351 Nonrheumatic aortic (valve) insufficiency: Secondary | ICD-10-CM

## 2019-02-22 DIAGNOSIS — I998 Other disorder of circulatory system: Secondary | ICD-10-CM

## 2019-02-22 LAB — CBC
HCT: 38.7 % — ABNORMAL LOW (ref 39.0–52.0)
Hemoglobin: 12.5 g/dL — ABNORMAL LOW (ref 13.0–17.0)
MCH: 28.9 pg (ref 26.0–34.0)
MCHC: 32.3 g/dL (ref 30.0–36.0)
MCV: 89.4 fL (ref 80.0–100.0)
Platelets: 141 10*3/uL — ABNORMAL LOW (ref 150–400)
RBC: 4.33 MIL/uL (ref 4.22–5.81)
RDW: 15 % (ref 11.5–15.5)
WBC: 4.4 10*3/uL (ref 4.0–10.5)
nRBC: 0 % (ref 0.0–0.2)

## 2019-02-22 LAB — BASIC METABOLIC PANEL
Anion gap: 9 (ref 5–15)
BUN: 11 mg/dL (ref 6–20)
CO2: 24 mmol/L (ref 22–32)
Calcium: 8.9 mg/dL (ref 8.9–10.3)
Chloride: 106 mmol/L (ref 98–111)
Creatinine, Ser: 0.89 mg/dL (ref 0.61–1.24)
GFR calc Af Amer: >60 mL/min (ref >60)
GFR calc non Af Amer: >60 mL/min (ref >60)
Glucose, Bld: 144 mg/dL — ABNORMAL HIGH (ref 70–99)
Potassium: 3 mmol/L — ABNORMAL LOW (ref 3.5–5.1)
Sodium: 139 mmol/L (ref 135–145)

## 2019-02-22 LAB — HEPARIN LEVEL (UNFRACTIONATED): Heparin Unfractionated: 0.35 IU/mL (ref 0.30–0.70)

## 2019-02-22 LAB — ECHOCARDIOGRAM COMPLETE
Height: 66 in
Weight: 2871.27 oz

## 2019-02-22 MED ORDER — HYDROMORPHONE HCL 1 MG/ML IJ SOLN
0.5000 mg | INTRAMUSCULAR | Status: DC | PRN
  Administered 2019-02-22 – 2019-02-27 (×33): 1 mg via INTRAVENOUS
  Filled 2019-02-22 (×33): qty 1

## 2019-02-22 MED ORDER — POTASSIUM CHLORIDE CRYS ER 20 MEQ PO TBCR
40.0000 meq | EXTENDED_RELEASE_TABLET | Freq: Two times a day (BID) | ORAL | Status: AC
  Administered 2019-02-22 (×2): 40 meq via ORAL
  Filled 2019-02-22 (×2): qty 2

## 2019-02-22 NOTE — Progress Notes (Addendum)
   Subjective: Patient is doing well this AM but feels that the pain medication are not last long enough. He gets relief for about 2 hours before things get worse. He understands the plan moving forward. We discuss smoking cessation today.   Objective: Vital signs in last 24 hours: Vitals:   02/21/19 1313 02/21/19 2107 02/22/19 0414 02/22/19 0800  BP: (!) 137/99 (!) 149/94 (!) 142/99 (!) 157/94  Pulse: 87  79   Resp: 18 (!) 22 18 14   Temp: 97.7 F (36.5 C) 98.1 F (36.7 C) 98.2 F (36.8 C) 98.2 F (36.8 C)  TempSrc: Oral Oral Oral Oral  SpO2: 96% 96% 97%   Weight:      Height:       General: Well nourished male in no acute distress Pulm: Good air movement with no wheezing or crackles  CV: RRR, no murmurs, no rubs  Extremities: LLE is warm  Assessment/Plan:  In summary, [name] is a 58 year old male with a history of HTN, HLD, CAD, PAD, and tobacco abuse presented with LLL ischemia.  Critical Limb IschemiaLeft Leg with bilateral PAD CAD HLD - Vascular planning for left femoral below the knee popliteal artery bypass either Tuesday or Wednesday.  - Cardiology has evaluated the patient for surgical clearance. Obtaining an echo to evaluate his Aortic regurg.  - Change dilaudid to every 2 hours PRN  - Continue Atorvastatin - Continue Heparin  - Discuss smoking cessation   Rheumatoid Arthritis:  -Rheumatology referralplaced by PCP. - Continue Prednisone  Chronic Pain:  -DilaudidQ2H PRN   HTN: -Amlodipine10 mg daily - Continue Chlorthalidone 25 mg QD  - Replace potassium, may need to switch from chlorthalidone if he continues to have hypokalemia   - Will need better control of HTN moving forward   AI - Repeat echocardiogram   Dispo: Anticipated discharge pending medical course  Ina Homes, MD 02/22/2019, 8:52 AM

## 2019-02-22 NOTE — Progress Notes (Signed)
  Echocardiogram 2D Echocardiogram has been performed.  [name] 02/22/2019, 2:56 PM

## 2019-02-22 NOTE — Progress Notes (Signed)
[place] for Heparin Indication: Critical Limb Ischemia  Allergies  Allergen Reactions  . Morphine And Related Rash  . Ramipril Rash    Per patient on R arm and leg only; no angioedema  . Tramadol Itching and Rash    Patient Measurements: Height: 5\' 6"  (167.6 cm) Weight: 179 lb 7.3 oz (81.4 kg) IBW/kg (Calculated) : 63.8 TBW 81.5 kg Height: 66 inches Heparin Dosing Weight: 80.3 kg   Vital Signs: Temp: 98.2 F (36.8 C) (08/29 0800) Temp Source: Oral (08/29 0800) BP: 157/94 (08/29 0800) Pulse Rate: 79 (08/29 0414)  Labs: Recent Labs    02/19/19 1805  02/20/19 0007  02/21/19 0359 02/21/19 0657 02/21/19 1036 02/21/19 1720 02/22/19 0247  HGB  --    < > 13.0  --  12.0*  --   --   --  12.5*  HCT  --   --  38.8*  --  36.6*  --   --   --  38.7*  PLT  --   --  145*  --  127*  --   --   --  141*  LABPROT 13.2  --   --   --   --   --   --   --   --   INR 1.0  --   --   --   --   --   --   --   --   HEPARINUNFRC  --   --  0.12*   < >  --   --  0.36 0.37 0.35  CREATININE  --   --  0.84  --   --  0.78  --   --  0.89   < > = values in this interval not displayed.    Estimated Creatinine Clearance: 90.6 mL/min (by C-G formula based on SCr of 0.89 mg/dL).   Medical History: Past Medical History:  Diagnosis Date  . Anemia   . Anemia 08/06/2012  . 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 Community acquired pneumonia 11/07/2011  . DDD (degenerative disc disease), lumbosacral     and grade 2 slip  . GERD (gastroesophageal reflux disease)   . Heart murmur   . History of anemia    no current med.  . Hypertension    states is borderline on medication; has been on med. x 5-6 yr.  . Hypokalemia 05/30/2018  . Impingement syndrome of shoulder region 10/2014   left  . Insomnia 06/01/2015  . Left shoulder pain 12/16/2015  . Low back pain without sciatica 10/29/2009  . Lupus ([place])   . Peripheral vascular disease ([place])   . Pneumonia   . RA (rheumatoid  arthritis) ([place])   . Rotator cuff tear 11/04/2014    Assessment: 58 yr old male with hx of  HTN, tobacco use, HLD, CAD, and RA presented to IM clinic with 2 week of pain in left great toe. About 5 days ago, he developed bilateral claudication with calf pain on ambulation and intermittent numbness in left toes. In IM clinic, ABIs were performed showing severe bilateral PAD. Bypass planned for Tuesday or Wednesday.  Heparin remains therapeutic at 0.35, no changes indicated. Hgb 12.5, plt 141 low but stable. No bleeding or infusion issues noted.  Goal of Therapy:  Heparin level 0.3-0.5 units/mL Monitor platelets by anticoagulation protocol: Yes   Plan:  Continue heparin 1750 units/h Daily heparin level and CBC  Richardine Service, PharmD PGY1 Pharmacy Resident Phone: [phone] 02/22/2019  1:25 PM  Please check [email or web address] for unit-specific pharmacy phone numbers.

## 2019-02-22 NOTE — Progress Notes (Signed)
Dr. Marlou Porch recommendations reviewed.  Waiting for echocardiogram.  Will return with any additional recommendations if necessary after reviewing the echocardiogram.

## 2019-02-23 DIAGNOSIS — I351 Nonrheumatic aortic (valve) insufficiency: Secondary | ICD-10-CM

## 2019-02-23 LAB — BASIC METABOLIC PANEL
Anion gap: 9 (ref 5–15)
BUN: 13 mg/dL (ref 6–20)
CO2: 25 mmol/L (ref 22–32)
Calcium: 9.2 mg/dL (ref 8.9–10.3)
Chloride: 105 mmol/L (ref 98–111)
Creatinine, Ser: 0.75 mg/dL (ref 0.61–1.24)
GFR calc Af Amer: >60 mL/min (ref >60)
GFR calc non Af Amer: >60 mL/min (ref >60)
Glucose, Bld: 107 mg/dL — ABNORMAL HIGH (ref 70–99)
Potassium: 4 mmol/L (ref 3.5–5.1)
Sodium: 139 mmol/L (ref 135–145)

## 2019-02-23 LAB — CBC
HCT: 39.2 % (ref 39.0–52.0)
Hemoglobin: 13 g/dL (ref 13.0–17.0)
MCH: 29.3 pg (ref 26.0–34.0)
MCHC: 33.2 g/dL (ref 30.0–36.0)
MCV: 88.3 fL (ref 80.0–100.0)
Platelets: 149 10*3/uL — ABNORMAL LOW (ref 150–400)
RBC: 4.44 MIL/uL (ref 4.22–5.81)
RDW: 15.1 % (ref 11.5–15.5)
WBC: 5.1 10*3/uL (ref 4.0–10.5)
nRBC: 0 % (ref 0.0–0.2)

## 2019-02-23 LAB — HEPARIN LEVEL (UNFRACTIONATED): Heparin Unfractionated: 0.5 IU/mL (ref 0.30–0.70)

## 2019-02-23 NOTE — Progress Notes (Signed)
   Subjective:   Mr. Eberling reports he is doing okay this morning. His pain continues to bother him but regular pain medication controls his pain well. He describes pain as stinging. He is able to eat/drink as normal. He was able to get some rest last night.   Discussed Vascular plans for surgery either Tuesday or Wednesday. All questions and concerns addressed.   Objective: Vital signs in last 24 hours: Vitals:   02/22/19 1657 02/22/19 1700 02/22/19 2210 02/23/19 0555  BP: (!) 119/91 (!) 117/91 (!) 148/98 (!) 153/103  Pulse: 75 84 (!) 53 77  Resp: 20 19 18 16   Temp: 98.8 F (37.1 C)  98.4 F (36.9 C) 98.1 F (36.7 C)  TempSrc: Oral  Oral Oral  SpO2: 97% 95% 91% 98%  Weight:      Height:       General: Well nourished male in no acute distress Pulm: Good air movement with no wheezing or crackles  CV: RRR, no murmurs, no rubs  Extremities: LLE is warm  Assessment/Plan:  In summary, Mr. Fick is a 58 year old male with a history of HTN, HLD, CAD, PAD, and tobacco abuse presented with LLL ischemia.  Critical Limb IschemiaLeft Leg with bilateral PAD CAD HLD - Vascular planning for left femoral below the knee popliteal artery bypass either Tuesday or Wednesday.  - Cardiology has evaluated the patient for surgical clearance. Echo yesterday without acute changes/unexplained findings  - Continue dilaudid to every 2 hours PRN  - Continue Atorvastatin - Continue Heparin  - Discuss smoking cessation   Rheumatoid Arthritis:  -Rheumatology referralplaced by PCP. - Continue Prednisone  Chronic Pain:  -DilaudidQ2H PRN   HTN: -Amlodipine10 mg daily - Continue Chlorthalidone 25 mg QD, potassium improved - Will need better control of HTN moving forward   AI - Repeat echocardiogram with trivial AI  Dispo: Anticipated discharge pending medical course  Ina Homes, MD 02/23/2019, 6:51 AM

## 2019-02-23 NOTE — Progress Notes (Addendum)
Progress Note  02/23/2019 8:27 AM 3 Days Post-Op  Subjective:  C/o pain in his foot.   afebrile HR 70's-90's  NSR [id number] systolic [id number] RA  Vitals:   02/23/19 0555 02/23/19 0827  BP: (!) 153/103 114/82  Pulse: 77 84  Resp: 16 15  Temp: 98.1 F (36.7 C) 98.4 F (36.9 C)  SpO2: 98% 94%    Physical Exam: Cardiac:  regular Lungs:  Non labored Extremities:  Left foot is warm; motor/sensory in tact   CBC    Component Value Date/Time   WBC 5.1 02/23/2019 0302   RBC 4.44 02/23/2019 0302   HGB 13.0 02/23/2019 0302   HGB 13.9 03/13/2017 1032   HCT 39.2 02/23/2019 0302   HCT 41.6 03/13/2017 1032   PLT 149 (L) 02/23/2019 0302   PLT 204 03/13/2017 1032   MCV 88.3 02/23/2019 0302   MCV 84 03/13/2017 1032   MCH 29.3 02/23/2019 0302   MCHC 33.2 02/23/2019 0302   RDW 15.1 02/23/2019 0302   RDW 13.7 03/13/2017 1032   LYMPHSABS 1.6 02/19/2019 1149   LYMPHSABS 1.8 03/13/2017 1032   MONOABS 0.4 02/19/2019 1149   EOSABS 0.1 02/19/2019 1149   EOSABS 0.2 03/13/2017 1032   BASOSABS 0.1 02/19/2019 1149   BASOSABS 0.0 03/13/2017 1032    BMET    Component Value Date/Time   NA 139 02/23/2019 0302   NA 140 05/30/2018 1413   K 4.0 02/23/2019 0302   CL 105 02/23/2019 0302   CO2 25 02/23/2019 0302   GLUCOSE 107 (H) 02/23/2019 0302   BUN 13 02/23/2019 0302   BUN 9 05/30/2018 1413   CREATININE 0.75 02/23/2019 0302   CREATININE 0.80 11/27/2012 0956   CALCIUM 9.2 02/23/2019 0302   GFRNONAA >60 02/23/2019 0302   GFRNONAA >89 11/27/2012 0956   GFRAA >60 02/23/2019 0302   GFRAA >89 11/27/2012 0956    INR    Component Value Date/Time   INR 1.0 02/19/2019 1805     Intake/Output Summary (Last 24 hours) at 02/23/2019 0827 Last data filed at 02/23/2019 0600 Gross per 24 hour  Intake 1232.51 ml  Output 1480 ml  Net -247.49 ml    2D Echocardiogram 02/22/2019:   1. The left ventricle has low normal systolic function, with an ejection fraction of 50-55%. The cavity size  was normal. Left ventricular diastolic Doppler parameters are consistent with impaired relaxation.  2. The right ventricle has normal systolic function. The cavity was normal. There is no increase in right ventricular wall thickness. Right ventricular systolic pressure is normal with an estimated pressure of 11.4 mmHg.  3. The aortic valve is tricuspid. Mild thickening of the aortic valve. Mild calcification of the aortic valve. Aortic valve regurgitation is trivial by color flow Doppler. No stenosis of the aortic valve.  4. The aorta is normal unless otherwise noted.   Assessment:  58 y.o. male is s/p:  1.Ultrasound-guided access of the right common femoral artery 2.Aortogram with bilateral lower extremity arteriogram 3.20 minutes of monitored moderate conscioussedationtime  3 Days Post-Op  Plan: -pt doing well this am -cardiology recommendation:  May proceed with moderate risk from CV perspective.  Echo was repeated.  EF 50-55%  -DVT prophylaxis:  Heparin gtt -will plan for left femoral to popliteal bypass grafting on Tuesday or Wednesday.  Dr. Trula Slade back tomorrow to finalize timing of surgery.  Leontine Locket, PA-C [facility] [phone] 02/23/2019 8:27 AM  I have interviewed the patient and examined the patient. I agree  with the findings by the PA.  For left femoropopliteal bypass Tuesday or Wednesday with Dr. Trula Slade.  He is felt to be at moderate risk from a cardiac standpoint for surgery.  Gae Gallop, MD [phone]

## 2019-02-23 NOTE — Progress Notes (Signed)
  Date: 02/23/2019  Patient name: [name] [name]  Medical record number: [id number]  Date of birth: 1960-12-16        I have seen and evaluated this patient and I have discussed the plan of care with the house staff. Please see their note for complete details. I concur with their findings.  Bartholomew Crews, MD 02/23/2019, 2:17 PM

## 2019-02-23 NOTE — Progress Notes (Signed)
No unexpected findings on echo. Low normal LVEF 50-55% without regional abnormalities. No significant valvular problems (AI described as trivial on current study). Please refer to Dr. Marlou Porch consult note. Please re-consult if needed. [facility] will sign off.   Medication Recommendations:  No changes Other recommendations (labs, testing, etc):  n/a Follow up as an outpatient:  Please call when he is ready for DC so we can make f/u arrangements.  Sanda Klein, MD, [facility] [phone] office [phone] pager

## 2019-02-23 NOTE — Progress Notes (Signed)
[place] for Heparin Indication: Critical Limb Ischemia  Allergies  Allergen Reactions  . Morphine And Related Rash  . Ramipril Rash    Per patient on R arm and leg only; no angioedema  . Tramadol Itching and Rash    Patient Measurements: Height: 5\' 6"  (167.6 cm) Weight: 179 lb 7.3 oz (81.4 kg) IBW/kg (Calculated) : 63.8 TBW 81.5 kg Height: 66 inches Heparin Dosing Weight: 80.3 kg   Vital Signs: Temp: 98.4 F (36.9 C) (08/30 0827) Temp Source: Oral (08/30 0827) BP: 114/82 (08/30 0827) Pulse Rate: 84 (08/30 0827)  Labs: Recent Labs    02/21/19 0359 02/21/19 0657  02/21/19 1720 02/22/19 0247 02/23/19 0302  HGB 12.0*  --   --   --  12.5* 13.0  HCT 36.6*  --   --   --  38.7* 39.2  PLT 127*  --   --   --  141* 149*  HEPARINUNFRC  --   --    < > 0.37 0.35 0.50  CREATININE  --  0.78  --   --  0.89 0.75   < > = values in this interval not displayed.    Estimated Creatinine Clearance: 100.8 mL/min (by C-G formula based on SCr of 0.75 mg/dL).   Medical History: Past Medical History:  Diagnosis Date  . Anemia   . Anemia 08/06/2012  . 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 Community acquired pneumonia 11/07/2011  . DDD (degenerative disc disease), lumbosacral     and grade 2 slip  . GERD (gastroesophageal reflux disease)   . Heart murmur   . History of anemia    no current med.  . Hypertension    states is borderline on medication; has been on med. x 5-6 yr.  . Hypokalemia 05/30/2018  . Impingement syndrome of shoulder region 10/2014   left  . Insomnia 06/01/2015  . Left shoulder pain 12/16/2015  . Low back pain without sciatica 10/29/2009  . Lupus ([place])   . Peripheral vascular disease ([place])   . Pneumonia   . RA (rheumatoid arthritis) ([place])   . Rotator cuff tear 11/04/2014    Assessment: 58 yr old male with hx of  HTN, tobacco use, HLD, CAD, and RA presented to IM clinic with 2 week of pain in left great toe. About 5 days ago, he  developed bilateral claudication with calf pain on ambulation and intermittent numbness in left toes. In IM clinic, ABIs were performed showing severe bilateral PAD. Bypass planned for Tuesday or Wednesday.  Heparin level at the upper limit of therapeutic at 0.5. Hg improved to 13.0 from 12.5, plts stable at 149. No bleeding or infusion issues per RN. Will decrease drip rate slightly to maintain therapeutic level.  Goal of Therapy:  Heparin level 0.3-0.5 units/mL Monitor platelets by anticoagulation protocol: Yes   Plan:  Decrease heparin to 1700 units/h Daily heparin level and CBC Monitor for bleeding  Richardine Service, PharmD PGY1 Pharmacy Resident Phone: [phone] 02/23/2019  9:38 AM  Please check [email or web address] for unit-specific pharmacy phone numbers.

## 2019-02-24 LAB — CBC
HCT: 39.6 % (ref 39.0–52.0)
Hemoglobin: 13.1 g/dL (ref 13.0–17.0)
MCH: 29.2 pg (ref 26.0–34.0)
MCHC: 33.1 g/dL (ref 30.0–36.0)
MCV: 88.4 fL (ref 80.0–100.0)
Platelets: 154 10*3/uL (ref 150–400)
RBC: 4.48 MIL/uL (ref 4.22–5.81)
RDW: 15.4 % (ref 11.5–15.5)
WBC: 5.6 10*3/uL (ref 4.0–10.5)
nRBC: 0 % (ref 0.0–0.2)

## 2019-02-24 LAB — SURGICAL PCR SCREEN
MRSA, PCR: NEGATIVE
Staphylococcus aureus: POSITIVE — AB

## 2019-02-24 LAB — HEPARIN LEVEL (UNFRACTIONATED): Heparin Unfractionated: 0.46 IU/mL (ref 0.30–0.70)

## 2019-02-24 NOTE — Progress Notes (Addendum)
[facility] [place]  Subjective  - Doing well over all.   Objective (!) 124/98 90 98.1 F (36.7 C) (Oral) 20 97%  Intake/Output Summary (Last 24 hours) at 02/24/2019 0723 Last data filed at 02/24/2019 0500 Gross per 24 hour  Intake 975.75 ml  Output 1050 ml  Net -74.25 ml   Left foot warm to touch with active range of motion intact Lungs non labored breathing Heart RRR   Assessment/Planning: POD #   2D Echocardiogram 02/22/2019:  1. The left ventricle has low normal systolic function, with an ejection fraction of 50-55%. The cavity size was normal. Left ventricular diastolic Doppler parameters are consistent with impaired relaxation. 2. The right ventricle has normal systolic function. The cavity was normal. There is no increase in right ventricular wall thickness. Right ventricular systolic pressure is normal with an estimated pressure of 11.4 mmHg. 3. The aortic valve is tricuspid. Mild thickening of the aortic valve. Mild calcification of the aortic valve. Aortic valve regurgitation is trivial by color flow Doppler. No stenosis of the aortic valve. 4. The aorta is normal unless otherwise noted.   Assessment:  57 y.o. male is s/p:  1.Ultrasound-guided access of the right common femoral artery 2.Aortogram with bilateral lower extremity arteriogram 3.20 minutes of monitored moderate conscioussedationtime  3 Days Post-Op  Plan: -pt doing well this am -cardiology recommendation:  May proceed with moderate risk from CV perspective.  Echo was repeated.  EF 50-55%   Plan for left Fem-pop bypass this week Tuesday or Wednesday.  Roxy Horseman 02/24/2019 7:23 AM --  Laboratory Lab Results: Recent Labs    02/23/19 0302 02/24/19 0315  WBC 5.1 5.6  HGB 13.0 13.1  HCT 39.2 39.6  PLT 149* 154   BMET Recent Labs    02/22/19 0247 02/23/19 0302  NA 139 139  K 3.0* 4.0  CL 106 105  CO2 24 25  GLUCOSE 144* 107*  BUN  11 13  CREATININE 0.89 0.75  CALCIUM 8.9 9.2    COAG Lab Results  Component Value Date   INR 1.0 02/19/2019   INR 1.0 03/13/2017   INR 0.92 11/19/2011   No results found for: PTT   Plan for surgery on Wednesday  [facility]

## 2019-02-24 NOTE — Progress Notes (Signed)
[place] for Heparin Indication: Critical Limb Ischemia  Allergies  Allergen Reactions  . Morphine And Related Rash  . Ramipril Rash    Per patient on R arm and leg only; no angioedema  . Tramadol Itching and Rash    Patient Measurements: Height: 5\' 6"  (167.6 cm) Weight: 179 lb 7.3 oz (81.4 kg) IBW/kg (Calculated) : 63.8 TBW 81.5 kg Height: 66 inches Heparin Dosing Weight: 80.3 kg   Vital Signs: Temp: 98.1 F (36.7 C) (08/31 0453) Temp Source: Oral (08/31 0453) BP: 124/98 (08/31 0453) Pulse Rate: 90 (08/31 0453)  Labs: Recent Labs    02/22/19 0247 02/23/19 0302 02/24/19 0315  HGB 12.5* 13.0 13.1  HCT 38.7* 39.2 39.6  PLT 141* 149* 154  HEPARINUNFRC 0.35 0.50 0.46  CREATININE 0.89 0.75  --     Estimated Creatinine Clearance: 100.8 mL/min (by C-G formula based on SCr of 0.75 mg/dL).   Medical History: Past Medical History:  Diagnosis Date  . Anemia   . Anemia 08/06/2012  . 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 Community acquired pneumonia 11/07/2011  . DDD (degenerative disc disease), lumbosacral     and grade 2 slip  . GERD (gastroesophageal reflux disease)   . Heart murmur   . History of anemia    no current med.  . Hypertension    states is borderline on medication; has been on med. x 5-6 yr.  . Hypokalemia 05/30/2018  . Impingement syndrome of shoulder region 10/2014   left  . Insomnia 06/01/2015  . Left shoulder pain 12/16/2015  . Low back pain without sciatica 10/29/2009  . Lupus ([place])   . Peripheral vascular disease ([place])   . Pneumonia   . RA (rheumatoid arthritis) ([place])   . Rotator cuff tear 11/04/2014    Assessment: 58 yr old male with hx of  HTN, tobacco use, HLD, CAD, and RA presented to IM clinic with 2 week of pain in left great toe. About 5 days ago, he developed bilateral claudication with calf pain on ambulation and intermittent numbness in left toes. In IM clinic, ABIs were performed showing severe  bilateral PAD. Bypass planned for Tuesday or Wednesday.  Heparin level therapeutic - unclear why goal is 0.3-0.5 so will adjust to normal goal. CBC stable, fem-pop this week.  Goal of Therapy:  Heparin level 0.3-0.7 units/mL Monitor platelets by anticoagulation protocol: Yes   Plan:  -Continue heparin 1700 units/hr -Daily heparin level and CBC   Arrie Senate, PharmD, BCPS Clinical Pharmacist [phone] Please check AMION for all [place] numbers 02/24/2019

## 2019-02-24 NOTE — Progress Notes (Signed)
       Subjective:  [name] was seen at bedside this morning. He stated that his pain has improved to a 4/10 on the pain scale with only a stinging sensation when his toe is touched. He states his pain medication has helped with his pain management. He denied issues with sleeping. He finished breakfast, or at least what he could tolerate. He is agreeable with the fem-pop bypass with vascular on 02/26/2019.   Objective:  Vital signs in last 24 hours: Vitals:   02/23/19 1825 02/23/19 2021 02/24/19 0453 02/24/19 0817  BP: 121/80 (!) 131/91 (!) 124/98 123/84  Pulse: 86 82 90 90  Resp: 15 16 20  (!) 22  Temp:  98.3 F (36.8 C) 98.1 F (36.7 C) 98.3 F (36.8 C)  TempSrc:  Oral Oral Oral  SpO2: 95% 96% 97% 100%  Weight:      Height:       General: Appears calm and well rested at bedside.  Cardiac: RRR, S1 and S2 appreciated, no murmurs, gallops, or rubs.  Pulmonary: Clear to auscultation bilaterally, no wheezes, rales, or rhonchi.  Skin: LLE pulses faint bilaterally, dorsalis pedis appreciable with doppler on the left and by hand on the right. None edematous. Loss of hair noted about midcalf and below bilaterally  Assessment/Plan:  Critical Limb IschemiaLeft Leg with bilateral PAD CAD HLD - Vascular planning for left femoral below the knee popliteal artery bypass on Wednesday.  - Continue dilaudid to every 2 hours PRN  - Continue Atorvastatin - Continue Heparin  - Discuss smoking cessation   Rheumatoid Arthritis:  -Rheumatology referralplaced by PCP. - Continue Prednisone  Chronic Pain:  -DilaudidQ2H PRN   HTN: -Amlodipine10 mg daily - Continue Chlorthalidone 25 mg QD, potassium improved - Will need better control of HTN moving forward   Dispo: Anticipated discharge pending medical course  Maudie Mercury, MD 02/24/2019, 9:52 AM

## 2019-02-25 DIAGNOSIS — L97529 Non-pressure chronic ulcer of other part of left foot with unspecified severity: Secondary | ICD-10-CM

## 2019-02-25 DIAGNOSIS — I70245 Atherosclerosis of native arteries of left leg with ulceration of other part of foot: Secondary | ICD-10-CM

## 2019-02-25 LAB — CBC
HCT: 42 % (ref 39.0–52.0)
Hemoglobin: 13.8 g/dL (ref 13.0–17.0)
MCH: 29.4 pg (ref 26.0–34.0)
MCHC: 32.9 g/dL (ref 30.0–36.0)
MCV: 89.4 fL (ref 80.0–100.0)
Platelets: 173 10*3/uL (ref 150–400)
RBC: 4.7 MIL/uL (ref 4.22–5.81)
RDW: 15.3 % (ref 11.5–15.5)
WBC: 5.7 10*3/uL (ref 4.0–10.5)
nRBC: 0 % (ref 0.0–0.2)

## 2019-02-25 LAB — HEPARIN LEVEL (UNFRACTIONATED): Heparin Unfractionated: 0.43 IU/mL (ref 0.30–0.70)

## 2019-02-25 MED ORDER — SODIUM CHLORIDE 0.9 % IV SOLN
1.5000 g | INTRAVENOUS | Status: AC
  Administered 2019-02-26: 13:00:00 1.5 g via INTRAVENOUS
  Filled 2019-02-25: qty 1.5

## 2019-02-25 MED ORDER — MUPIROCIN 2 % EX OINT
1.0000 "application " | TOPICAL_OINTMENT | Freq: Two times a day (BID) | CUTANEOUS | Status: DC
  Administered 2019-02-25 – 2019-02-27 (×5): 1 via NASAL
  Filled 2019-02-25 (×2): qty 22

## 2019-02-25 MED ORDER — CHLORHEXIDINE GLUCONATE CLOTH 2 % EX PADS
6.0000 | MEDICATED_PAD | Freq: Every day | CUTANEOUS | Status: DC
  Administered 2019-02-25 – 2019-02-26 (×2): 6 via TOPICAL

## 2019-02-25 NOTE — Progress Notes (Signed)
   Subjective: Patient seen at the bedside on morning rounds today.  Says stool is sore, but is well controlled with the Dilaudid.  He has no other complaints at this time.  Patient notes understanding that he will go to the operating room with vascular surgery to have a femoropopliteal bypass procedure to restore blood flow.  Objective:  Vital signs in last 24 hours: Vitals:   02/25/19 0552 02/25/19 0805 02/25/19 1246 02/25/19 1308  BP: 121/89 (!) 124/101 117/87 111/70  Pulse: 84 (!) 113 97 84  Resp: 18 13 13 20   Temp: 97.9 F (36.6 C)   98.5 F (36.9 C)  TempSrc: Oral   Oral  SpO2: 97% 94% 92% 96%  Weight:      Height:       Physical Exam Vitals signs reviewed.  Constitutional:      General: He is not in acute distress.    Appearance: Normal appearance. He is normal weight. He is not ill-appearing, toxic-appearing or diaphoretic.  HENT:     Head: Normocephalic and atraumatic.  Eyes:     General: No scleral icterus.       Right eye: No discharge.        Left eye: No discharge.     Extraocular Movements: Extraocular movements intact.  Cardiovascular:     Rate and Rhythm: Normal rate and regular rhythm.     Heart sounds: Normal heart sounds. No murmur. No friction rub. No gallop.      Comments: unpalpable dorsalis pedis pulses Pulmonary:     Effort: Pulmonary effort is normal. No respiratory distress.     Breath sounds: Normal breath sounds. No wheezing or rales.  Abdominal:     General: Abdomen is flat. Bowel sounds are normal. There is no distension.     Palpations: Abdomen is soft.     Tenderness: There is no abdominal tenderness. There is no guarding.  Musculoskeletal:        General: Deformity (L great toe is erythematous. 2cm ischemic ulcer apparent on medial aspect of great toe) present.     Right lower leg: No edema.     Left lower leg: No edema.  Neurological:     Mental Status: He is alert.    Assessment/Plan:  Principal Problem:   Critical lower limb  ischemia Active Problems:   HTN (hypertension)   PAD (peripheral artery disease) (HCC)   Ischemic ulcer of toe of left foot (HCC)  In summary, Mr. Metzker is a 58 year old male with a history of HTN, HLD, CAD, PVD, tobacco abuse, and RA who presented with 2 weeks progression of left great toe pain and discoloration. Vascular surgery performed aortogram,arteriogram and vein mapping in preparation for left femoral popliteal artery bypass which will take place on 9/2.   #CAD #HLD #L great toe ischemia: Vascular surgery will perform left femoral below the knee popliteal artery bypass tomorrow AM (9/2) - Dilaudid 1mg  q2hr PRN  - Continue Atorvastatin - NPO at midnight in preparation for surgery  - Discuss smoking cessation   #HTN -Amlodipine10 mgdaily - Chlorthalidone 25 mg QD  #Rheumatoid Arthritis -Rheumatology referralplaced by PCP. - Prednisone 5mg  daily   #DVT prophylaxis - IV heparin   Dispo:Anticipated discharge pending medical course  Earlene Plater, MD Internal Medicine, PGY1 Pager: [phone]  02/25/2019,3:08 PM

## 2019-02-25 NOTE — H&P (View-Only) (Signed)
.      Subjective  -   Ready for surgery   Physical Exam:  Doppler left PT abd soft Non-labored breating   Carotid doppler:  1-39% B    Assessment/Plan:    Left to ulcer:  Patient deemed moderate risk by cardiology.  He has a limb threatening situation with a open ulcer on his left great toe.  He underwent angiography and was not a candidate for percutaneous revascularization.  He has been vein mapped and had a marginal GSC.  I have recommended left femoral to below knee bypass with vein.  THe details of the procedure as well as the risks and benefits were discussed.  He wants to proceed.  He understands that even with revascularization, he could require a toe amputation if the wound does not heal, or even a more proximal amputation.   NPO after midnight  [name] [name] 02/25/2019 8:16 AM --  Vitals:   02/24/19 2025 02/25/19 0552  BP: (!) 131/95 121/89  Pulse: 93 84  Resp: 20 18  Temp: 98.3 F (36.8 C) 97.9 F (36.6 C)  SpO2: 96% 97%    Intake/Output Summary (Last 24 hours) at 02/25/2019 0816 Last data filed at 02/25/2019 0552 Gross per 24 hour  Intake 421.72 ml  Output 1125 ml  Net -703.28 ml     Laboratory CBC    Component Value Date/Time   WBC 5.7 02/25/2019 0300   HGB 13.8 02/25/2019 0300   HGB 13.9 03/13/2017 1032   HCT 42.0 02/25/2019 0300   HCT 41.6 03/13/2017 1032   PLT 173 02/25/2019 0300   PLT 204 03/13/2017 1032    BMET    Component Value Date/Time   NA 139 02/23/2019 0302   NA 140 05/30/2018 1413   K 4.0 02/23/2019 0302   CL 105 02/23/2019 0302   CO2 25 02/23/2019 0302   GLUCOSE 107 (H) 02/23/2019 0302   BUN 13 02/23/2019 0302   BUN 9 05/30/2018 1413   CREATININE 0.75 02/23/2019 0302   CREATININE 0.80 11/27/2012 0956   CALCIUM 9.2 02/23/2019 0302   GFRNONAA >60 02/23/2019 0302   GFRNONAA >89 11/27/2012 0956   GFRAA >60 02/23/2019 0302   GFRAA >89 11/27/2012 0956    COAG Lab Results  Component Value Date   INR 1.0 02/19/2019   INR 1.0 03/13/2017   INR 0.92 11/19/2011   No results found for: PTT  Antibiotics Anti-infectives (From admission, onward)   Start     Dose/Rate Route Frequency Ordered Stop   02/26/19 0800  cefUROXime (ZINACEF) 1.5 g in sodium chloride 0.9 % 100 mL IVPB     1.5 g 200 mL/hr over 30 Minutes Intravenous To ShortStay Surgical 02/25/19 0746 02/27/19 0800       V. Leia Alf, M.D.,  [facility] [place] Office: [phone] Pager:  [phone]

## 2019-02-25 NOTE — Progress Notes (Signed)
[place] for Heparin Indication: Critical Limb Ischemia  Allergies  Allergen Reactions  . Morphine And Related Rash  . Ramipril Rash    Per patient on R arm and leg only; no angioedema  . Tramadol Itching and Rash    Patient Measurements: Height: 5\' 6"  (167.6 cm) Weight: 179 lb 7.3 oz (81.4 kg) IBW/kg (Calculated) : 63.8 TBW 81.5 kg Height: 66 inches Heparin Dosing Weight: 80.3 kg   Vital Signs: Temp: 97.9 F (36.6 C) (09/01 0552) Temp Source: Oral (09/01 0552) BP: 124/101 (09/01 0805) Pulse Rate: 113 (09/01 0805)  Labs: Recent Labs    02/23/19 0302 02/24/19 0315 02/25/19 0300  HGB 13.0 13.1 13.8  HCT 39.2 39.6 42.0  PLT 149* 154 173  HEPARINUNFRC 0.50 0.46 0.43  CREATININE 0.75  --   --     Estimated Creatinine Clearance: 100.8 mL/min (by C-G formula based on SCr of 0.75 mg/dL).   Medical History: Past Medical History:  Diagnosis Date  . Anemia   . Anemia 08/06/2012  . 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 Community acquired pneumonia 11/07/2011  . DDD (degenerative disc disease), lumbosacral     and grade 2 slip  . GERD (gastroesophageal reflux disease)   . Heart murmur   . History of anemia    no current med.  . Hypertension    states is borderline on medication; has been on med. x 5-6 yr.  . Hypokalemia 05/30/2018  . Impingement syndrome of shoulder region 10/2014   left  . Insomnia 06/01/2015  . Left shoulder pain 12/16/2015  . Low back pain without sciatica 10/29/2009  . Lupus ([place])   . Peripheral vascular disease ([place])   . Pneumonia   . RA (rheumatoid arthritis) ([place])   . Rotator cuff tear 11/04/2014    Assessment: 58 yr old male with hx of  HTN, tobacco use, HLD, CAD, and RA presented to IM clinic with 2 week of pain in left great toe. About 5 days ago, he developed bilateral claudication with calf pain on ambulation and intermittent numbness in left toes. In IM clinic, ABIs were performed showing severe bilateral  PAD. Bypass planned for Tuesday or Wednesday.  Heparin level therapeutic  Goal of Therapy:  Heparin level 0.3-0.7 units/mL Monitor platelets by anticoagulation protocol: Yes   Plan:  -Continue heparin 1700 units/hr -Daily heparin level and CBC   Anette Guarneri, PharmD Clinical Pharmacist [phone] Please check AMION for all [facility] Pharmacy numbers 02/25/2019

## 2019-02-25 NOTE — Progress Notes (Signed)
.      Subjective  -   Ready for surgery   Physical Exam:  Doppler left PT abd soft Non-labored breating   Carotid doppler:  1-39% B    Assessment/Plan:    Left to ulcer:  Patient deemed moderate risk by cardiology.  He has a limb threatening situation with a open ulcer on his left great toe.  He underwent angiography and was not a candidate for percutaneous revascularization.  He has been vein mapped and had a marginal GSC.  I have recommended left femoral to below knee bypass with vein.  THe details of the procedure as well as the risks and benefits were discussed.  He wants to proceed.  He understands that even with revascularization, he could require a toe amputation if the wound does not heal, or even a more proximal amputation.   NPO after midnight  [name] [name] 02/25/2019 8:16 AM --  Vitals:   02/24/19 2025 02/25/19 0552  BP: (!) 131/95 121/89  Pulse: 93 84  Resp: 20 18  Temp: 98.3 F (36.8 C) 97.9 F (36.6 C)  SpO2: 96% 97%    Intake/Output Summary (Last 24 hours) at 02/25/2019 0816 Last data filed at 02/25/2019 0552 Gross per 24 hour  Intake 421.72 ml  Output 1125 ml  Net -703.28 ml     Laboratory CBC    Component Value Date/Time   WBC 5.7 02/25/2019 0300   HGB 13.8 02/25/2019 0300   HGB 13.9 03/13/2017 1032   HCT 42.0 02/25/2019 0300   HCT 41.6 03/13/2017 1032   PLT 173 02/25/2019 0300   PLT 204 03/13/2017 1032    BMET    Component Value Date/Time   NA 139 02/23/2019 0302   NA 140 05/30/2018 1413   K 4.0 02/23/2019 0302   CL 105 02/23/2019 0302   CO2 25 02/23/2019 0302   GLUCOSE 107 (H) 02/23/2019 0302   BUN 13 02/23/2019 0302   BUN 9 05/30/2018 1413   CREATININE 0.75 02/23/2019 0302   CREATININE 0.80 11/27/2012 0956   CALCIUM 9.2 02/23/2019 0302   GFRNONAA >60 02/23/2019 0302   GFRNONAA >89 11/27/2012 0956   GFRAA >60 02/23/2019 0302   GFRAA >89 11/27/2012 0956    COAG Lab Results  Component Value Date   INR 1.0 02/19/2019   INR 1.0 03/13/2017   INR 0.92 11/19/2011   No results found for: PTT  Antibiotics Anti-infectives (From admission, onward)   Start     Dose/Rate Route Frequency Ordered Stop   02/26/19 0800  cefUROXime (ZINACEF) 1.5 g in sodium chloride 0.9 % 100 mL IVPB     1.5 g 200 mL/hr over 30 Minutes Intravenous To ShortStay Surgical 02/25/19 0746 02/27/19 0800       V. Leia Alf, M.D., [facility] [place] Office: [phone] Pager:  [phone]

## 2019-02-25 NOTE — Care Management Important Message (Signed)
Important Message  Patient Details  Name: [name] [name] MRN: [id number] Date of Birth: December 01, 1960   Medicare Important Message Given:  Yes     [name] 02/25/2019, 12:05 PM

## 2019-02-26 ENCOUNTER — Encounter (HOSPITAL_COMMUNITY): Payer: Self-pay | Admitting: *Deleted

## 2019-02-26 ENCOUNTER — Inpatient Hospital Stay (HOSPITAL_COMMUNITY): Payer: Medicare Other | Admitting: Certified Registered Nurse Anesthetist

## 2019-02-26 ENCOUNTER — Encounter (HOSPITAL_COMMUNITY)
Admission: AD | Disposition: A | Discharge status: Home / Self Care | Payer: Self-pay | Source: Ambulatory Visit | Attending: Internal Medicine

## 2019-02-26 DIAGNOSIS — I70245 Atherosclerosis of native arteries of left leg with ulceration of other part of foot: Secondary | ICD-10-CM

## 2019-02-26 HISTORY — PX: FEMORAL-POPLITEAL BYPASS GRAFT: SHX937

## 2019-02-26 LAB — CBC
HCT: 41.9 % (ref 39.0–52.0)
Hemoglobin: 13.8 g/dL (ref 13.0–17.0)
MCH: 29.4 pg (ref 26.0–34.0)
MCHC: 32.9 g/dL (ref 30.0–36.0)
MCV: 89.1 fL (ref 80.0–100.0)
Platelets: 168 10*3/uL (ref 150–400)
RBC: 4.7 MIL/uL (ref 4.22–5.81)
RDW: 15.3 % (ref 11.5–15.5)
WBC: 5.7 10*3/uL (ref 4.0–10.5)
nRBC: 0 % (ref 0.0–0.2)

## 2019-02-26 LAB — BASIC METABOLIC PANEL
Anion gap: 14 (ref 5–15)
BUN: 19 mg/dL (ref 6–20)
CO2: 23 mmol/L (ref 22–32)
Calcium: 9.3 mg/dL (ref 8.9–10.3)
Chloride: 99 mmol/L (ref 98–111)
Creatinine, Ser: 0.82 mg/dL (ref 0.61–1.24)
GFR calc Af Amer: >60 mL/min (ref >60)
GFR calc non Af Amer: >60 mL/min (ref >60)
Glucose, Bld: 94 mg/dL (ref 70–99)
Potassium: 3.5 mmol/L (ref 3.5–5.1)
Sodium: 136 mmol/L (ref 135–145)

## 2019-02-26 LAB — PROTIME-INR
INR: 1 (ref 0.8–1.2)
Prothrombin Time: 13.3 seconds (ref 11.4–15.2)

## 2019-02-26 LAB — PREPARE RBC (CROSSMATCH)

## 2019-02-26 LAB — HEPARIN LEVEL (UNFRACTIONATED): Heparin Unfractionated: 0.43 IU/mL (ref 0.30–0.70)

## 2019-02-26 SURGERY — BYPASS GRAFT FEMORAL-POPLITEAL ARTERY
Anesthesia: General | Laterality: Left

## 2019-02-26 MED ORDER — SODIUM CHLORIDE 0.9 % IV SOLN
INTRAVENOUS | Status: DC | PRN
  Administered 2019-02-26: 500 mL

## 2019-02-26 MED ORDER — DOCUSATE SODIUM 100 MG PO CAPS: 100.0000 mg | ORAL_CAPSULE | Freq: Every day | ORAL | Status: DC

## 2019-02-26 MED ORDER — METOPROLOL TARTRATE 5 MG/5ML IV SOLN: 2.0000 mg | INTRAVENOUS | Status: DC | PRN

## 2019-02-26 MED ORDER — ROCURONIUM BROMIDE 10 MG/ML (PF) SYRINGE
PREFILLED_SYRINGE | INTRAVENOUS | Status: DC | PRN
  Administered 2019-02-26: 50 mg via INTRAVENOUS
  Administered 2019-02-26 (×2): 20 mg via INTRAVENOUS
  Administered 2019-02-26: 50 mg via INTRAVENOUS
  Administered 2019-02-26: 20 mg via INTRAVENOUS

## 2019-02-26 MED ORDER — FENTANYL CITRATE (PF) 250 MCG/5ML IJ SOLN
INTRAMUSCULAR | Status: AC
  Filled 2019-02-26: qty 5

## 2019-02-26 MED ORDER — ONDANSETRON HCL 4 MG/2ML IJ SOLN: 4.0000 mg | Freq: Four times a day (QID) | INTRAMUSCULAR | Status: DC | PRN

## 2019-02-26 MED ORDER — PHENYLEPHRINE 40 MCG/ML (10ML) SYRINGE FOR IV PUSH (FOR BLOOD PRESSURE SUPPORT)
PREFILLED_SYRINGE | INTRAVENOUS | Status: AC
  Filled 2019-02-26: qty 10

## 2019-02-26 MED ORDER — MIDAZOLAM HCL 2 MG/2ML IJ SOLN
INTRAMUSCULAR | Status: DC | PRN
  Administered 2019-02-26: 2 mg via INTRAVENOUS

## 2019-02-26 MED ORDER — FENTANYL CITRATE (PF) 250 MCG/5ML IJ SOLN
INTRAMUSCULAR | Status: DC | PRN
  Administered 2019-02-26 (×3): 50 ug via INTRAVENOUS
  Administered 2019-02-26: 100 ug via INTRAVENOUS
  Administered 2019-02-26 (×2): 50 ug via INTRAVENOUS

## 2019-02-26 MED ORDER — HYDRALAZINE HCL 20 MG/ML IJ SOLN: 5.0000 mg | INTRAMUSCULAR | Status: DC | PRN

## 2019-02-26 MED ORDER — PHENYLEPHRINE HCL (PRESSORS) 10 MG/ML IV SOLN
INTRAVENOUS | Status: DC | PRN
  Administered 2019-02-26 (×2): 80 ug via INTRAVENOUS

## 2019-02-26 MED ORDER — FENTANYL CITRATE (PF) 100 MCG/2ML IJ SOLN
INTRAMUSCULAR | Status: AC
  Administered 2019-02-26: 25 ug via INTRAVENOUS
  Filled 2019-02-26: qty 2

## 2019-02-26 MED ORDER — POLYETHYLENE GLYCOL 3350 17 G PO PACK: 17.0000 g | PACK | Freq: Every day | ORAL | Status: DC | PRN

## 2019-02-26 MED ORDER — LACTATED RINGERS IV SOLN
INTRAVENOUS | Status: DC | PRN
  Administered 2019-02-26 (×2): via INTRAVENOUS

## 2019-02-26 MED ORDER — PROPOFOL 10 MG/ML IV BOLUS
INTRAVENOUS | Status: AC
  Filled 2019-02-26: qty 20

## 2019-02-26 MED ORDER — OXYCODONE HCL 5 MG/5ML PO SOLN: 5.0000 mg | Freq: Once | ORAL | Status: DC | PRN

## 2019-02-26 MED ORDER — CEFAZOLIN SODIUM-DEXTROSE 2-4 GM/100ML-% IV SOLN
2.0000 g | Freq: Three times a day (TID) | INTRAVENOUS | Status: AC
  Administered 2019-02-26 – 2019-02-27 (×2): 2 g via INTRAVENOUS
  Filled 2019-02-26 (×2): qty 100

## 2019-02-26 MED ORDER — ROCURONIUM BROMIDE 10 MG/ML (PF) SYRINGE
PREFILLED_SYRINGE | INTRAVENOUS | Status: AC
  Filled 2019-02-26: qty 30

## 2019-02-26 MED ORDER — DEXAMETHASONE SODIUM PHOSPHATE 10 MG/ML IJ SOLN
INTRAMUSCULAR | Status: AC
  Filled 2019-02-26: qty 2

## 2019-02-26 MED ORDER — DEXAMETHASONE SODIUM PHOSPHATE 10 MG/ML IJ SOLN
INTRAMUSCULAR | Status: DC | PRN
  Administered 2019-02-26: 10 mg via INTRAVENOUS

## 2019-02-26 MED ORDER — ONDANSETRON HCL 4 MG/2ML IJ SOLN
INTRAMUSCULAR | Status: AC
  Filled 2019-02-26: qty 4

## 2019-02-26 MED ORDER — DEXMEDETOMIDINE HCL IN NACL 200 MCG/50ML IV SOLN
INTRAVENOUS | Status: DC | PRN
  Administered 2019-02-26: .4 ug/kg/h via INTRAVENOUS

## 2019-02-26 MED ORDER — OXYCODONE-ACETAMINOPHEN 5-325 MG PO TABS
1.0000 | ORAL_TABLET | ORAL | Status: DC | PRN
  Administered 2019-02-26 – 2019-02-27 (×5): 2 via ORAL
  Filled 2019-02-26 (×5): qty 2

## 2019-02-26 MED ORDER — PROTAMINE SULFATE 10 MG/ML IV SOLN
INTRAVENOUS | Status: AC
  Filled 2019-02-26: qty 25

## 2019-02-26 MED ORDER — ONDANSETRON HCL 4 MG/2ML IJ SOLN
INTRAMUSCULAR | Status: DC | PRN
  Administered 2019-02-26: 4 mg via INTRAVENOUS

## 2019-02-26 MED ORDER — 0.9 % SODIUM CHLORIDE (POUR BTL) OPTIME
TOPICAL | Status: DC | PRN
  Administered 2019-02-26 (×2): 1000 mL

## 2019-02-26 MED ORDER — GUAIFENESIN-DM 100-10 MG/5ML PO SYRP: 15.0000 mL | ORAL_SOLUTION | ORAL | Status: DC | PRN

## 2019-02-26 MED ORDER — SODIUM CHLORIDE 0.9 % IV SOLN
INTRAVENOUS | Status: DC
  Administered 2019-02-26: 19:00:00 via INTRAVENOUS

## 2019-02-26 MED ORDER — LIDOCAINE 2% (20 MG/ML) 5 ML SYRINGE
INTRAMUSCULAR | Status: DC | PRN
  Administered 2019-02-26: 60 mg via INTRAVENOUS

## 2019-02-26 MED ORDER — LIDOCAINE 2% (20 MG/ML) 5 ML SYRINGE
INTRAMUSCULAR | Status: AC
  Filled 2019-02-26: qty 10

## 2019-02-26 MED ORDER — FENTANYL CITRATE (PF) 100 MCG/2ML IJ SOLN
25.0000 ug | INTRAMUSCULAR | Status: DC | PRN
  Administered 2019-02-26: 18:00:00 25 ug via INTRAVENOUS

## 2019-02-26 MED ORDER — ALUM & MAG HYDROXIDE-SIMETH 200-200-20 MG/5ML PO SUSP: 15.0000 mL | ORAL | Status: DC | PRN

## 2019-02-26 MED ORDER — DEXMEDETOMIDINE HCL IN NACL 200 MCG/50ML IV SOLN
INTRAVENOUS | Status: AC
  Filled 2019-02-26: qty 100

## 2019-02-26 MED ORDER — EPHEDRINE 5 MG/ML INJ
INTRAVENOUS | Status: AC
  Filled 2019-02-26: qty 10

## 2019-02-26 MED ORDER — HEPARIN SODIUM (PORCINE) 1000 UNIT/ML IJ SOLN
INTRAMUSCULAR | Status: DC | PRN
  Administered 2019-02-26: 9000 [IU] via INTRAVENOUS

## 2019-02-26 MED ORDER — MAGNESIUM SULFATE 2 GM/50ML IV SOLN: 2.0000 g | Freq: Every day | INTRAVENOUS | Status: DC | PRN

## 2019-02-26 MED ORDER — MIDAZOLAM HCL 2 MG/2ML IJ SOLN
INTRAMUSCULAR | Status: AC
  Filled 2019-02-26: qty 2

## 2019-02-26 MED ORDER — SUGAMMADEX SODIUM 200 MG/2ML IV SOLN
INTRAVENOUS | Status: DC | PRN
  Administered 2019-02-26: 200 mg via INTRAVENOUS

## 2019-02-26 MED ORDER — SODIUM CHLORIDE 0.9 % IV SOLN: 500.0000 mL | Freq: Once | INTRAVENOUS | Status: DC | PRN

## 2019-02-26 MED ORDER — SODIUM CHLORIDE 0.9 % IV SOLN
INTRAVENOUS | Status: DC | PRN
  Administered 2019-02-26: 13:00:00 25 ug/min via INTRAVENOUS

## 2019-02-26 MED ORDER — PROPOFOL 10 MG/ML IV BOLUS
INTRAVENOUS | Status: DC | PRN
  Administered 2019-02-26: 160 mg via INTRAVENOUS

## 2019-02-26 MED ORDER — PROTAMINE SULFATE 10 MG/ML IV SOLN
INTRAVENOUS | Status: DC | PRN
  Administered 2019-02-26: 50 mg via INTRAVENOUS

## 2019-02-26 MED ORDER — LACTATED RINGERS IV SOLN
INTRAVENOUS | Status: DC
  Administered 2019-02-26: 11:00:00 via INTRAVENOUS

## 2019-02-26 MED ORDER — HEPARIN SODIUM (PORCINE) 1000 UNIT/ML IJ SOLN
INTRAMUSCULAR | Status: AC
  Filled 2019-02-26: qty 3

## 2019-02-26 MED ORDER — HEPARIN SODIUM (PORCINE) 5000 UNIT/ML IJ SOLN: 5000.0000 [IU] | Freq: Three times a day (TID) | INTRAMUSCULAR | Status: DC

## 2019-02-26 MED ORDER — BISACODYL 10 MG RE SUPP: 10.0000 mg | Freq: Every day | RECTAL | Status: DC | PRN

## 2019-02-26 MED ORDER — HEMOSTATIC AGENTS (NO CHARGE) OPTIME
TOPICAL | Status: DC | PRN
  Administered 2019-02-26 (×2): 1 via TOPICAL

## 2019-02-26 MED ORDER — SODIUM CHLORIDE 0.9 % IV SOLN
INTRAVENOUS | Status: AC
  Filled 2019-02-26: qty 1.2

## 2019-02-26 MED ORDER — OXYCODONE HCL 5 MG PO TABS: 5.0000 mg | ORAL_TABLET | Freq: Once | ORAL | Status: DC | PRN

## 2019-02-26 MED ORDER — PROTAMINE SULFATE 10 MG/ML IV SOLN
INTRAVENOUS | Status: AC
  Filled 2019-02-26: qty 5

## 2019-02-26 MED ORDER — LABETALOL HCL 5 MG/ML IV SOLN: 10.0000 mg | INTRAVENOUS | Status: DC | PRN

## 2019-02-26 MED ORDER — PANTOPRAZOLE SODIUM 40 MG PO TBEC
40.0000 mg | DELAYED_RELEASE_TABLET | Freq: Every day | ORAL | Status: DC
  Administered 2019-02-26 – 2019-02-27 (×2): 40 mg via ORAL
  Filled 2019-02-26 (×2): qty 1

## 2019-02-26 MED ORDER — PHENOL 1.4 % MT LIQD: 1.0000 | OROMUCOSAL | Status: DC | PRN

## 2019-02-26 MED ORDER — POTASSIUM CHLORIDE CRYS ER 20 MEQ PO TBCR: 20.0000 meq | EXTENDED_RELEASE_TABLET | Freq: Every day | ORAL | Status: DC | PRN

## 2019-02-26 SURGICAL SUPPLY — 66 items
ADH SKN CLS APL DERMABOND .7 (GAUZE/BANDAGES/DRESSINGS) ×4
BANDAGE ESMARK 6X9 LF (GAUZE/BANDAGES/DRESSINGS) IMPLANT
BNDG CMPR 9X6 STRL LF SNTH (GAUZE/BANDAGES/DRESSINGS) ×1
BNDG ESMARK 6X9 LF (GAUZE/BANDAGES/DRESSINGS) ×3
CANISTER SUCT 3000ML PPV (MISCELLANEOUS) ×3 IMPLANT
CANNULA VESSEL 3MM 2 BLNT TIP (CANNULA) ×3 IMPLANT
CLIP VESOCCLUDE MED 24/CT (CLIP) ×3 IMPLANT
CLIP VESOCCLUDE SM WIDE 24/CT (CLIP) ×3 IMPLANT
COVER WAND RF STERILE (DRAPES) ×3 IMPLANT
CUFF TOURN SGL QUICK 24 (TOURNIQUET CUFF)
CUFF TOURN SGL QUICK 34 (TOURNIQUET CUFF) ×3
CUFF TOURN SGL QUICK 42 (TOURNIQUET CUFF) IMPLANT
CUFF TRNQT CYL 24X4X16.5-23 (TOURNIQUET CUFF) IMPLANT
CUFF TRNQT CYL 34X4.125X (TOURNIQUET CUFF) IMPLANT
DERMABOND ADVANCED (GAUZE/BANDAGES/DRESSINGS) ×8
DERMABOND ADVANCED .7 DNX12 (GAUZE/BANDAGES/DRESSINGS) ×1 IMPLANT
DRAIN CHANNEL 15F RND FF W/TCR (WOUND CARE) IMPLANT
DRAPE HALF SHEET 40X57 (DRAPES) IMPLANT
DRAPE INCISE IOBAN 66X45 STRL (DRAPES) ×4 IMPLANT
DRAPE X-RAY CASS 24X20 (DRAPES) IMPLANT
ELECT REM PT RETURN 9FT ADLT (ELECTROSURGICAL) ×3
ELECTRODE REM PT RTRN 9FT ADLT (ELECTROSURGICAL) ×1 IMPLANT
EVACUATOR SILICONE 100CC (DRAIN) IMPLANT
GLOVE BIO SURGEON STRL SZ 6.5 (GLOVE) ×3 IMPLANT
GLOVE BIO SURGEONS STRL SZ 6.5 (GLOVE) ×3
GLOVE BIOGEL PI IND STRL 7.5 (GLOVE) ×1 IMPLANT
GLOVE BIOGEL PI IND STRL 8 (GLOVE) IMPLANT
GLOVE BIOGEL PI INDICATOR 7.5 (GLOVE) ×6
GLOVE BIOGEL PI INDICATOR 8 (GLOVE) ×2
GLOVE ECLIPSE 8.0 STRL XLNG CF (GLOVE) ×2 IMPLANT
GLOVE SURG SS PI 7.5 STRL IVOR (GLOVE) ×7 IMPLANT
GOWN STRL REUS W/ TWL LRG LVL3 (GOWN DISPOSABLE) ×2 IMPLANT
GOWN STRL REUS W/ TWL XL LVL3 (GOWN DISPOSABLE) ×1 IMPLANT
GOWN STRL REUS W/TWL LRG LVL3 (GOWN DISPOSABLE) ×6
GOWN STRL REUS W/TWL XL LVL3 (GOWN DISPOSABLE) ×6
GRAFT PROPATEN W/RING 6X80X60 (Vascular Products) ×2 IMPLANT
HEMOSTAT SNOW SURGICEL 2X4 (HEMOSTASIS) ×4 IMPLANT
INSERT FOGARTY SM (MISCELLANEOUS) IMPLANT
KIT BASIN OR (CUSTOM PROCEDURE TRAY) ×3 IMPLANT
KIT TURNOVER KIT B (KITS) ×3 IMPLANT
MARKER GRAFT CORONARY BYPASS (MISCELLANEOUS) IMPLANT
NS IRRIG 1000ML POUR BTL (IV SOLUTION) ×6 IMPLANT
PACK PERIPHERAL VASCULAR (CUSTOM PROCEDURE TRAY) ×3 IMPLANT
PAD ARMBOARD 7.5X6 YLW CONV (MISCELLANEOUS) ×6 IMPLANT
SET COLLECT BLD 21X3/4 12 (NEEDLE) IMPLANT
STOPCOCK 4 WAY LG BORE MALE ST (IV SETS) IMPLANT
SUT ETHILON 3 0 PS 1 (SUTURE) IMPLANT
SUT GORETEX 6.0 TT13 (SUTURE) IMPLANT
SUT GORETEX 6.0 TT9 (SUTURE) IMPLANT
SUT PROLENE 5 0 C 1 24 (SUTURE) ×3 IMPLANT
SUT PROLENE 6 0 BV (SUTURE) ×5 IMPLANT
SUT PROLENE 7 0 BV 1 (SUTURE) IMPLANT
SUT SILK 2 0 SH (SUTURE) ×3 IMPLANT
SUT SILK 3 0 (SUTURE) ×6
SUT SILK 3-0 18XBRD TIE 12 (SUTURE) IMPLANT
SUT VIC AB 2-0 CT1 27 (SUTURE) ×9
SUT VIC AB 2-0 CT1 TAPERPNT 27 (SUTURE) ×2 IMPLANT
SUT VIC AB 3-0 SH 27 (SUTURE) ×12
SUT VIC AB 3-0 SH 27X BRD (SUTURE) ×2 IMPLANT
SUT VIC AB 4-0 PS2 18 (SUTURE) ×4 IMPLANT
SUT VICRYL 4-0 PS2 18IN ABS (SUTURE) ×6 IMPLANT
TOWEL GREEN STERILE (TOWEL DISPOSABLE) ×3 IMPLANT
TRAY FOLEY MTR SLVR 16FR STAT (SET/KITS/TRAYS/PACK) ×3 IMPLANT
TUBING EXTENTION W/L.L. (IV SETS) IMPLANT
UNDERPAD 30X30 (UNDERPADS AND DIAPERS) ×3 IMPLANT
WATER STERILE IRR 1000ML POUR (IV SOLUTION) ×3 IMPLANT

## 2019-02-26 NOTE — Transfer of Care (Signed)
Immediate Anesthesia Transfer of Care Note  Patient: [name] [name]  Procedure(s) Performed: BYPASS GRAFT FEMORAL-POPLITEAL ARTERY LEFT LEG USING 51mm PROPATEN GRAFT (Left )  Patient Location: PACU  Anesthesia Type:General  Level of Consciousness: awake, alert  and oriented  Airway & Oxygen Therapy: Patient Spontanous Breathing and Patient connected to nasal cannula oxygen  Post-op Assessment: Report given to RN and Post -op Vital signs reviewed and stable  Post vital signs: Reviewed and stable  Last Vitals:  Vitals Value Taken Time  BP 107/76 02/26/19 1634  Temp 36.4 C 02/26/19 1635  Pulse 88 02/26/19 1644  Resp 18 02/26/19 1644  SpO2 94 % 02/26/19 1644  Vitals shown include unvalidated device data.  Last Pain:  Vitals:   02/26/19 0920  TempSrc:   PainSc: 2       Patients Stated Pain Goal: 0 ([id number])  Complications: No apparent anesthesia complications

## 2019-02-26 NOTE — Progress Notes (Signed)
[place] for Heparin Indication: Critical Limb Ischemia  Allergies  Allergen Reactions  . Morphine And Related Rash  . Ramipril Rash    Per patient on R arm and leg only; no angioedema  . Tramadol Itching and Rash    Patient Measurements: Height: 5\' 6"  (167.6 cm) Weight: 179 lb 7.3 oz (81.4 kg) IBW/kg (Calculated) : 63.8 TBW 81.5 kg Height: 66 inches Heparin Dosing Weight: 80.3 kg   Vital Signs: Temp: 98.3 F (36.8 C) (09/02 0542) Temp Source: Oral (09/02 0542) BP: 118/96 (09/02 0542) Pulse Rate: 92 (09/02 0542)  Labs: Recent Labs    02/24/19 0315 02/25/19 0300 02/26/19 0301  HGB 13.1 13.8 13.8  HCT 39.6 42.0 41.9  PLT 154 173 168  LABPROT  --   --  13.3  INR  --   --  1.0  HEPARINUNFRC 0.46 0.43 0.43  CREATININE  --   --  0.82    Estimated Creatinine Clearance: 98.3 mL/min (by C-G formula based on SCr of 0.82 mg/dL).   Medical History: Past Medical History:  Diagnosis Date  . Anemia   . Anemia 08/06/2012  . 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 Community acquired pneumonia 11/07/2011  . DDD (degenerative disc disease), lumbosacral     and grade 2 slip  . GERD (gastroesophageal reflux disease)   . Heart murmur   . History of anemia    no current med.  . Hypertension    states is borderline on medication; has been on med. x 5-6 yr.  . Hypokalemia 05/30/2018  . Impingement syndrome of shoulder region 10/2014   left  . Insomnia 06/01/2015  . Left shoulder pain 12/16/2015  . Low back pain without sciatica 10/29/2009  . Lupus ([place])   . Peripheral vascular disease ([place])   . Pneumonia   . RA (rheumatoid arthritis) ([place])   . Rotator cuff tear 11/04/2014    Assessment: 58 yr old male with hx of  HTN, tobacco use, HLD, CAD, and RA presented to IM clinic with 2 week of pain in left great toe. About 5 days ago, he developed bilateral claudication with calf pain on ambulation and intermittent numbness in left toes. In IM clinic,  ABIs were performed showing severe bilateral PAD. Bypass planned for today  Heparin level therapeutic   Goal of Therapy:  Heparin level 0.3-0.7 units/mL Monitor platelets by anticoagulation protocol: Yes   Plan:  -Continue heparin 1700 units/hr -Follow up post procedure   Anette Guarneri, PharmD Clinical Pharmacist [phone] Please check AMION for all [facility] Pharmacy numbers 02/26/2019

## 2019-02-26 NOTE — Anesthesia Preprocedure Evaluation (Signed)
Anesthesia Evaluation  Patient identified by MRN, date of birth, ID band Patient awake    Reviewed: Allergy & Precautions, H&P , NPO status , Patient's Chart, lab work & pertinent test results  Airway Mallampati: II   Neck ROM: full    Dental   Pulmonary former smoker,    breath sounds clear to auscultation       Cardiovascular hypertension, + Peripheral Vascular Disease   Rhythm:regular Rate:Normal     Neuro/Psych    GI/Hepatic GERD  ,  Endo/Other    Renal/GU      Musculoskeletal  (+) Arthritis , Rheumatoid disorders,    Abdominal   Peds  Hematology   Anesthesia Other Findings   Reproductive/Obstetrics                             Anesthesia Physical Anesthesia Plan  ASA: III  Anesthesia Plan: General   Post-op Pain Management:    Induction: Intravenous  PONV Risk Score and Plan: 2 and Ondansetron, Dexamethasone, Midazolam and Treatment may vary due to age or medical condition  Airway Management Planned: Oral ETT  Additional Equipment:   Intra-op Plan:   Post-operative Plan: Extubation in OR  Informed Consent: I have reviewed the patients History and Physical, chart, labs and discussed the procedure including the risks, benefits and alternatives for the proposed anesthesia with the patient or authorized representative who has indicated his/her understanding and acceptance.       Plan Discussed with: CRNA, Anesthesiologist and Surgeon  Anesthesia Plan Comments:         Anesthesia Quick Evaluation

## 2019-02-26 NOTE — Discharge Instructions (Signed)
 [facility] [place]  Discharge instructions  Lower Extremity Bypass Surgery  Please refer to the following instruction for your post-procedure care. Your surgeon or physician assistant will discuss any changes with you.  Activity  You are encouraged to walk as much as you can. You can slowly return to normal activities during the month after your surgery. Avoid strenuous activity and heavy lifting until your doctor tells you it's OK. Avoid activities such as vacuuming or swinging a golf club. Do not drive until your doctor give the OK and you are no longer taking prescription pain medications. It is also normal to have difficulty with sleep habits, eating and bowel movement after surgery. These will go away with time.  Bathing/Showering  Shower daily after you go home. Do not soak in a bathtub, hot tub, or swim until the incision heals completely.  Incision Care  Clean your incision with mild soap and water. Shower every day. Pat the area dry with a clean towel. You do not need a bandage unless otherwise instructed. Do not apply any ointments or creams to your incision. If you have open wounds you will be instructed how to care for them or a visiting nurse may be arranged for you. If you have staples or sutures along your incision they will be removed at your post-op appointment. You may have skin glue on your incision. Do not peel it off. It will come off on its own in about one week.  Wash the groin wound with soap and water daily and pat dry. (No tub bath-only shower)  Then put a dry gauze or washcloth in the groin to keep this area dry to help prevent wound infection.  Do this daily and as needed.  Do not use Vaseline or neosporin on your incisions.  Only use soap and water on your incisions and then protect and keep dry.  Diet  Resume your normal diet. There are no special food restrictions following this procedure. A low fat/ low cholesterol diet is  recommended for all patients with vascular disease. In order to heal from your surgery, it is CRITICAL to get adequate nutrition. Your body requires vitamins, minerals, and protein. Vegetables are the best source of vitamins and minerals. Vegetables also provide the perfect balance of protein. Processed food has little nutritional value, so try to avoid this.  Medications  Resume taking all your medications unless your doctor or physician assistant tells you not to. If your incision is causing pain, you may take over-the-counter pain relievers such as acetaminophen (Tylenol). If you were prescribed a stronger pain medication, please aware these medication can cause nausea and constipation. Prevent nausea by taking the medication with a snack or meal. Avoid constipation by drinking plenty of fluids and eating foods with high amount of fiber, such as fruits, vegetables, and grains. Take Colace 100 mg (an over-the-counter stool softener) twice a day as needed for constipation.  Do not take Tylenol if you are taking prescription pain medications.  Follow Up  Our office will schedule a follow up appointment 2-3 weeks following discharge.  Please call us immediately for any of the following conditions  Severe or worsening pain in your legs or feet while at rest or while walking Increase pain, redness, warmth, or drainage (pus) from your incision site(s) Fever of 101 degree or higher The swelling in your leg with the bypass suddenly worsens and becomes more painful than when you were in the hospital If you have   been instructed to feel your graft pulse then you should do so every day. If you can no longer feel this pulse, call the office immediately. Not all patients are given this instruction.  Leg swelling is common after leg bypass surgery.  The swelling should improve over a few months following surgery. To improve the swelling, you may elevate your legs above the level of your heart while you are  sitting or resting. Your surgeon or physician assistant may ask you to apply an ACE wrap or wear compression (TED) stockings to help to reduce swelling.  Reduce your risk of vascular disease  Stop smoking. If you would like help call [facility] at 1-800-QUIT-NOW (1-800-784-8669) or [place] at 336-586-4000.  Manage your cholesterol Maintain a desired weight Control your diabetes weight Control your diabetes Keep your blood pressure down  If you have any questions, please call the office at [phone]  

## 2019-02-26 NOTE — Anesthesia Procedure Notes (Signed)
Procedure Name: Intubation Date/Time: 02/26/2019 12:50 PM Performed by: Clearnce Sorrel, CRNA Pre-anesthesia Checklist: Patient identified, Emergency Drugs available, Suction available, Patient being monitored and Timeout performed Patient Re-evaluated:Patient Re-evaluated prior to induction Oxygen Delivery Method: Circle system utilized Preoxygenation: Pre-oxygenation with 100% oxygen Induction Type: IV induction Ventilation: Oral airway inserted - appropriate to patient size and Mask ventilation without difficulty Laryngoscope Size: Mac and 4 Grade View: Grade I Tube size: 7.5 mm Number of attempts: 1 Airway Equipment and Method: Stylet Placement Confirmation: ETT inserted through vocal cords under direct vision,  breath sounds checked- equal and bilateral and positive ETCO2 Secured at: 23 cm Tube secured with: Tape Dental Injury: Teeth and Oropharynx as per pre-operative assessment

## 2019-02-26 NOTE — Progress Notes (Signed)
Surgeon contacted regarding heparin gtt. Stated to turn gtt off at this time. Orders received and carried out.

## 2019-02-26 NOTE — Op Note (Signed)
Patient name: [name] [name] MRN: [id number] DOB: 1960-07-30 Sex: male  02/26/2019 Pre-operative Diagnosis: Left great toe ulcer Post-operative diagnosis:  Same Surgeon:  Annamarie Major Assistants: [name] Ryne Procedure:   #1: Left common femoral to below-knee popliteal artery bypass graft with 6 mm external ring propatent PTFE   #2: Harvest of left great saphenous vein Anesthesia: General Blood Loss: 50 cc Specimens: None  Findings: The proximal anastomosis was to the distal common femoral artery.  This was a healthy artery.  The distal anastomosis was to the below-knee popliteal artery which had mild calcification.  I harvested the saphenous vein.  It was narrowed in the midportion.  When I prepared on the back table a 20 cm segment did not dilate with saline infiltration and so I tried to dilate it with sequential dilators however the section of the vein was very sclerotic and thus the vein was not usable.  I did remove debris from the origin of the superficial femoral artery so that he may be a candidate for percutaneous intervention down the road  Indications: The patient presented with an ischemic ulcer to his left great toe.  Angiography revealed an occluded superficial femoral artery.  He comes in today for bypass graft.  Procedure:  The patient was identified in the holding area and taken to [facility] [address]  The patient was then placed supine on the table. general anesthesia was administered.  The patient was prepped and draped in the usual sterile fashion.  A time out was called and antibiotics were administered.  A longitudinal incision was made in the left groin.  I dissected out the common femoral profundofemoral and superficial femoral arteries and individually isolated them.  There was minimal calcification within the common femoral artery.  I then identified the saphenous vein and traced up to the saphenofemoral junction ligating all side branches.  The saphenous vein was then  harvested throughout skip incisions down the lower leg.  The midportion of the vein appeared somewhat small which was consistent with his preoperative vein mapping.  Through the distal medial incision from the vein harvest, I also exposed the popliteal artery.  I did not divide any tibial veins.  Next, a Gore tunneler was used to create a subsartorial tunnel between the 2 incisions.  The saphenous vein was then ligated proximally and distally with 2-0 silk ties.  It was then distended with heparin saline.  The proximal and distal portion of the vein distended adequately however a 20 cm segment in the midportion did not distend at all.  I then used sequential dilators to try and dilate this area however it was very sclerotic and thus I felt that it was not usable for bypass.  I then brought a 6 mm Gore-Tex external ring propatent PTFE graft on the table to use as a conduit.  The patient was fully heparinized.  After the heparin circulated the femoral vessels were occluded.  A #11 blade was used to make an arteriotomy which was extended longitudinally with Potts scissors.  The artery was very healthy at this level.  The graft was then spatulated to fit set the arteriotomy and a running anastomosis was created with 6-0 Prolene.  Once anastomosis was completed there was excellent pulsatile flow through the graft.  The graft was then brought through the previously created tunnel making sure to maintain proper orientation.  A tourniquet was then placed on the upper thigh.  The leg was exsanguinated with an Esmarch and  the tourniquet was taken to in a 50 mm of pressure.  I then used a 11 blade to make an arteriotomy in the popliteal artery which was extended longitudinally with Potts scissors.  There was mild calcification.  I contemplated placing a vein patch however felt that it was probably better to just proceed with the distal anastomosis with Gore-Tex.  The leg was straightened and the graft was cut the appropriate  length and spatulated to fit the size the arteriotomy.  A running anastomosis was created 6-0 Prolene.  Prior to completion, the tourniquet was let down and the appropriate flushing maneuvers were performed.  The anastomosis was then completed.  There was an excellent pulse within the graft.  The patient had biphasic anterior tibial and peroneal artery signals.  Protamine was given to reverse the heparin.  The wounds were irrigated and hemostasis was achieved.  The vein harvest incisions were closed with 2 layers of 3-0 Vicryl.  The below-knee incision was closed by reapproximating the fascia with 2-0 Vicryl and then the subcutaneous tissue with additional layers of Vicryl.  The groin was closed similarly by reapproximating the femoral sheath with 2-0 Vicryl and subcutaneous tissue with multiple layers of Vicryl followed 3-0 Vicryl closure of the skin.  Dermabond was placed in all the incisions.  Patient tolerated the procedure well.  He was extubated taken recovery in stable condition.    Disposition: To PACU stable.   Theotis Burrow, M.D., [facility] [place] Office: [phone] Pager:  [phone]

## 2019-02-26 NOTE — Interval H&P Note (Signed)
History and Physical Interval Note:  02/26/2019 11:53 AM  [name] [name]  has presented today for surgery, with the diagnosis of CRITICAL LIMB ISCHEMIA LEFT LOWER EXTREMITY.  The various methods of treatment have been discussed with the patient and family. After consideration of risks, benefits and other options for treatment, the patient has consented to  Procedure(s): BYPASS GRAFT FEMORAL-POPLITEAL ARTERY LEFT LEG (Left) as a surgical intervention.  The patient's history has been reviewed, patient examined, no change in status, stable for surgery.  I have reviewed the patient's chart and labs.  Questions were answered to the patient's satisfaction.     Annamarie Major

## 2019-02-26 NOTE — Progress Notes (Signed)
Pt received from PACU. VSS. L DP and PT dopplerable. CHG complete. Telemetry applied. Call light in reach.  Clyde Canterbury, RN

## 2019-02-26 NOTE — Progress Notes (Signed)
   Subjective: Pt seen at the bedside on rounds this AM. Scheduled for femoral popliteal bypass with vascular surgery today.  Patient says his toe is sore but the Dilaudid is helpful.  Patient made aware that after surgery his pain might get worse due to reperfusion.  He voiced understanding and is ready for surgery.  Objective:  Vital signs in last 24 hours: Vitals:   02/25/19 1747 02/25/19 1900 02/25/19 2037 02/26/19 0542  BP: (!) 125/98  (!) 123/98 (!) 118/96  Pulse: (!) 110  90 92  Resp: (!) 21 17 17 15   Temp:   (!) 97.5 F (36.4 C) 98.3 F (36.8 C)  TempSrc:   Oral Oral  SpO2: 93%  94% 98%  Weight:      Height:       Physical Exam: General: Comfortable appearing resting in bed HEENT: NCAT CV: RRR, S1-S2 normal, no murmurs rubs or gallops appreciated.  L great toe erythematous tender to touch.  Ischemic ulcer appreciated on the medial aspect of L great toe. Pulmonary: Normal work of breathing Neuro: Alert and oriented x4, no focal deficits appreciated  Assessment/Plan:  Principal Problem:   Critical lower limb ischemia Active Problems:   HTN (hypertension)   PAD (peripheral artery disease) (HCC)   Ischemic ulcer of toe of left foot (HCC)  In summary, Mr. Roel is a 58 year old male with a history of HTN, HLD, CAD, PVD, tobacco abuse, and RA who presented with 2 weeks progression of left great toe pain and discoloration. Vascular surgery performed aortogram,arteriogram and vein mapping in preparation for left femoral popliteal artery bypass which will take place today.   #CAD #HLD #L great toe ischemia: Vascular surgery to perform left femoral below the knee popliteal artery bypasstoday (9/2). - Dilaudid 1mg  q2hr PRN, will evalauate pain after surgery and make appropriate changes - Continue Atorvastatin  #HTN -Amlodipine10 mgdaily - Chlorthalidone 25 mg QD  #Rheumatoid Arthritis -Rheumatology referralplaced by PCP - Continue Prednisone 5mg  daily   #DVT  prophylaxis - IV heparin   Dispo:Anticipated discharge pending medical course.   Earlene Plater, MD Internal Medicine, PGY1 Pager: [phone]  02/26/2019,11:33 AM

## 2019-02-27 ENCOUNTER — Inpatient Hospital Stay (HOSPITAL_COMMUNITY): Payer: Medicare Other

## 2019-02-27 ENCOUNTER — Encounter (HOSPITAL_COMMUNITY): Payer: Self-pay | Admitting: Surgery

## 2019-02-27 DIAGNOSIS — Z9862 Peripheral vascular angioplasty status: Secondary | ICD-10-CM

## 2019-02-27 DIAGNOSIS — Z885 Allergy status to narcotic agent status: Secondary | ICD-10-CM

## 2019-02-27 DIAGNOSIS — Z7982 Long term (current) use of aspirin: Secondary | ICD-10-CM

## 2019-02-27 DIAGNOSIS — I739 Peripheral vascular disease, unspecified: Secondary | ICD-10-CM

## 2019-02-27 DIAGNOSIS — Z888 Allergy status to other drugs, medicaments and biological substances status: Secondary | ICD-10-CM

## 2019-02-27 LAB — CBC
HCT: 34.8 % — ABNORMAL LOW (ref 39.0–52.0)
Hemoglobin: 11.9 g/dL — ABNORMAL LOW (ref 13.0–17.0)
MCH: 30.1 pg (ref 26.0–34.0)
MCHC: 34.2 g/dL (ref 30.0–36.0)
MCV: 87.9 fL (ref 80.0–100.0)
Platelets: 137 10*3/uL — ABNORMAL LOW (ref 150–400)
RBC: 3.96 MIL/uL — ABNORMAL LOW (ref 4.22–5.81)
RDW: 15 % (ref 11.5–15.5)
WBC: 6.6 10*3/uL (ref 4.0–10.5)
nRBC: 0 % (ref 0.0–0.2)

## 2019-02-27 LAB — BASIC METABOLIC PANEL
Anion gap: 12 (ref 5–15)
BUN: 15 mg/dL (ref 6–20)
CO2: 23 mmol/L (ref 22–32)
Calcium: 8.5 mg/dL — ABNORMAL LOW (ref 8.9–10.3)
Chloride: 100 mmol/L (ref 98–111)
Creatinine, Ser: 0.97 mg/dL (ref 0.61–1.24)
GFR calc Af Amer: >60 mL/min (ref >60)
GFR calc non Af Amer: >60 mL/min (ref >60)
Glucose, Bld: 207 mg/dL — ABNORMAL HIGH (ref 70–99)
Potassium: 3.9 mmol/L (ref 3.5–5.1)
Sodium: 135 mmol/L (ref 135–145)

## 2019-02-27 MED ORDER — POLYETHYLENE GLYCOL 3350 17 G PO PACK: 17.0000 g | PACK | Freq: Every day | ORAL | 0 refills | Status: DC | PRN

## 2019-02-27 MED ORDER — OXYCODONE-ACETAMINOPHEN 10-325 MG PO TABS: 1.0000 | ORAL_TABLET | ORAL | 0 refills | Status: DC | PRN

## 2019-02-27 MED ORDER — NICOTINE 21 MG/24HR TD PT24: 21.0000 mg | MEDICATED_PATCH | Freq: Every day | TRANSDERMAL | 0 refills | Status: DC

## 2019-02-27 MED ORDER — OXYCODONE-ACETAMINOPHEN 5-325 MG PO TABS: 1.0000 | ORAL_TABLET | ORAL | 0 refills | Status: DC | PRN

## 2019-02-27 MED ORDER — ATORVASTATIN CALCIUM 80 MG PO TABS: 80.0000 mg | ORAL_TABLET | Freq: Every day | ORAL | 11 refills | Status: DC

## 2019-02-27 MED ORDER — PANTOPRAZOLE SODIUM 40 MG PO TBEC: 40.0000 mg | DELAYED_RELEASE_TABLET | Freq: Every day | ORAL | 2 refills | Status: DC

## 2019-02-27 MED ORDER — DOCUSATE SODIUM 100 MG PO CAPS: 100.0000 mg | ORAL_CAPSULE | Freq: Every day | ORAL | 0 refills | Status: DC

## 2019-02-27 MED FILL — DOK 100 MG CAPS: 100 | 10 days supply | Qty: 10 | Fill #0

## 2019-02-27 MED FILL — PANTOPRAZOLE SOD DR 40 MG T: 40 | 30 days supply | Qty: 30 | Fill #0

## 2019-02-27 MED FILL — POLYETHYLENE GLYCOL 3350 PO: 17 | 14 days supply | Qty: 238 | Fill #0

## 2019-02-27 MED FILL — OXYCODONE-APAP 10-325: 10-325 | 7 days supply | Qty: 30 | Fill #0

## 2019-02-27 MED FILL — ATORVASTATIN CALCIUM 80 MG: 80 | 30 days supply | Qty: 30 | Fill #0

## 2019-02-27 NOTE — Evaluation (Signed)
Physical Therapy Evaluation Patient Details Name: [name] [name] MRN: [id number] DOB: [birth year] Today's Date: 02/27/2019   History of Present Illness  58 yr old male with hx of  HTN, tobacco use, HLD, CAD, and RA presented to IM clinic with 2 week of pain in left great toe. S/p Left common femoral to below-knee popliteal artery bypass graft  Clinical Impression  Patient received in bed, finishing up doppler. Patient agrees to PT evaluation. Reports he is having pain in his great toe. Performed bed mobility with mod independence. Transfers initially with supervision. Ambulated 200 feet and up/down 3 steps with supervision, no AD. Cues needed for stair sequencing with use of single rail. Patient will benefit from continued skilled PT acutely to improve activity tolerance and strength for return home independently.        Follow Up Recommendations No PT follow up    Equipment Recommendations  None recommended by PT    Recommendations for Other Services       Precautions / Restrictions Precautions Precautions: Fall Precaution Comments: moderate Restrictions Weight Bearing Restrictions: No      Mobility  Bed Mobility Overal bed mobility: Modified Independent             General bed mobility comments: utilized bed rails and increased time but no assistance  Transfers Overall transfer level: Needs assistance Equipment used: None Transfers: Sit to/from Stand Sit to Stand: Supervision         General transfer comment: supervision, stiff initially  Ambulation/Gait Ambulation/Gait assistance: Supervision Gait Distance (Feet): 200 Feet Assistive device: None Gait Pattern/deviations: Antalgic Gait velocity: slightly decreased   General Gait Details: great toe pain the most limiting factor.  Stairs Stairs: Yes Stairs assistance: Supervision Stair Management: One rail Left;One rail Right;Sideways Number of Stairs: 3 General stair comments: cues needed for sequencing,  but no physical assist.  Wheelchair Mobility    Modified Rankin (Stroke Patients Only)       Balance Overall balance assessment: Independent                                           Pertinent Vitals/Pain Pain Assessment: Faces Faces Pain Scale: Hurts even more Pain Location: L great toe Pain Descriptors / Indicators: Sore Pain Intervention(s): Monitored during session    Home Living Family/patient expects to be discharged to:: Private residence Living Arrangements: Alone Available Help at Discharge: Family;Friend(s);Available PRN/intermittently Type of Home: House Home Access: Stairs to enter Entrance Stairs-Rails: Right Entrance Stairs-Number of Steps: 3 Home Layout: One level Home Equipment: Tub bench;Grab bars - tub/shower Additional Comments: Patient reports his girlfriend will be staying with him when he goes home    Prior Function Level of Independence: Independent               Hand Dominance   Dominant Hand: Left    Extremity/Trunk Assessment   Upper Extremity Assessment Upper Extremity Assessment: Overall WFL for tasks assessed    Lower Extremity Assessment Lower Extremity Assessment: Overall WFL for tasks assessed    Cervical / Trunk Assessment Cervical / Trunk Assessment: Normal  Communication   Communication: No difficulties  Cognition   Behavior During Therapy: WFL for tasks assessed/performed Overall Cognitive Status: Within Functional Limits for tasks assessed  General Comments      Exercises     Assessment/Plan    PT Assessment Patient needs continued PT services  PT Problem List Decreased strength;Decreased activity tolerance;Pain;Decreased mobility       PT Treatment Interventions Therapeutic activities;Gait training;Functional mobility training;Patient/family education;Therapeutic exercise    PT Goals (Current goals can be found in the Care Plan  section)  Acute Rehab PT Goals Patient Stated Goal: go home today PT Goal Formulation: With patient Time For Goal Achievement: 03/01/19 Potential to Achieve Goals: Good    Frequency Min 2X/week   Barriers to discharge Decreased caregiver support      Co-evaluation               AM-PAC PT "6 Clicks" Mobility  Outcome Measure Help needed turning from your back to your side while in a flat bed without using bedrails?: None Help needed moving from lying on your back to sitting on the side of a flat bed without using bedrails?: None Help needed moving to and from a bed to a chair (including a wheelchair)?: A Little Help needed standing up from a chair using your arms (e.g., wheelchair or bedside chair)?: None Help needed to walk in hospital room?: A Little Help needed climbing 3-5 steps with a railing? : A Little 6 Click Score: 21    End of Session Equipment Utilized During Treatment: Gait belt Activity Tolerance: Patient tolerated treatment well;Patient limited by pain Patient left: in bed;with call bell/phone within reach Nurse Communication: Mobility status PT Visit Diagnosis: Pain;Muscle weakness (generalized) (M62.81);Difficulty in walking, not elsewhere classified (R26.2) Pain - Right/Left: Left Pain - part of body: Ankle and joints of foot    Time: 1015-1035 PT Time Calculation (min) (ACUTE ONLY): 20 min   Charges:   PT Evaluation $PT Eval Moderate Complexity: 1 Mod PT Treatments $Gait Training: 8-22 mins        Sharleen Szczesny, PT, GCS 02/27/19,12:57 PM

## 2019-02-27 NOTE — Evaluation (Signed)
Occupational Therapy Evaluation Patient Details Name: [name] [name] MRN: [id number] DOB: [birth year] Today's Date: 02/27/2019    History of Present Illness 58 yr old male with hx of  HTN, tobacco use, HLD, CAD, and RA presented to IM clinic with 2 week of pain in left great toe. S/p Left common femoral to below-knee popliteal artery bypass graft   Clinical Impression   Pt admitted with above and presents to OT with minimal impairments impacting ability to complete ADLs at [facility].  Pt initially requiring min guard for sit > stand, quickly progressing to supervision with RW with increased mobility. Pt reports pain and soreness in LLE at operative site.  Pt able to don LB clothing with supervision and maintain standing to complete grooming tasks with supervision.  Pt reports family/friends able to provide initial supervision/assist as long as he needs them.  Pt will not require further OT at this time.  Will sign off.    Follow Up Recommendations  No OT follow up;Supervision - Intermittent    Equipment Recommendations  3 in 1 bedside commode;Other (comment)(RW)       Precautions / Restrictions Precautions Precautions: Fall Restrictions Weight Bearing Restrictions: No      Mobility Bed Mobility Overal bed mobility: Modified Independent             General bed mobility comments: utilized bed rails and increased time but no assistance  Transfers Overall transfer level: Needs assistance Equipment used: Rolling walker (2 wheeled) Transfers: Sit to/from Stand Sit to Stand: Min guard;Supervision         General transfer comment: Initial min guard due to first time OOB, subsequent sit > stand were close supervision        ADL either performed or assessed with clinical judgement   ADL Overall ADL's : Needs assistance/impaired     Grooming: Wash/dry hands;Wash/dry face;Supervision/safety;Standing               Lower Body Dressing: Min guard;Sit to/from stand Lower Body  Dressing Details (indicate cue type and reason): Pt donned underwear and Lt shoe seated EOB with increased time and min guard for standing balance when pulling underwear over hips Toilet Transfer: Min guard;Ambulation;RW Toilet Transfer Details (indicate cue type and reason): min guard for sit > stand from lower surface, discussed recommendation for 3 in 1 to increase safety and independence with sit > stand from toilet         Functional mobility during ADLs: Min guard;Rolling walker;Supervision/safety General ADL Comments: Initial min guard for sit > stand and ambulation with RW.  Progressing to supervision.  Utilized RW for stability, manage pain, and energy conservation     Vision Baseline Vision/History: Wears glasses Wears Glasses: Reading only Patient Visual Report: No change from baseline Vision Assessment?: No apparent visual deficits            Pertinent Vitals/Pain Pain Assessment: 0-10 Pain Score: 7  Pain Location: Lt leg Pain Descriptors / Indicators: Sore Pain Intervention(s): Limited activity within patient's tolerance;Monitored during session;Repositioned     Hand Dominance Left   Extremity/Trunk Assessment Upper Extremity Assessment Upper Extremity Assessment: Overall WFL for tasks assessed           Communication Communication Communication: No difficulties   Cognition Arousal/Alertness: Awake/alert Behavior During Therapy: WFL for tasks assessed/performed Overall Cognitive Status: Within Functional Limits for tasks assessed  Home Living Family/patient expects to be discharged to:: Private residence Living Arrangements: Alone Available Help at Discharge: Friend(s);Available 24 hours/day Type of Home: House Home Access: Stairs to enter [employer] of Steps: 3 Entrance Stairs-Rails: Right Home Layout: One level     Bathroom Shower/Tub: Engineer, site: Standard Bathroom Accessibility: Yes How Accessible: Accessible via walker Home Equipment: Tub bench;Grab bars - tub/shower          Prior Functioning/Environment Level of Independence: Independent                 OT Problem List: Decreased activity tolerance;Decreased range of motion;Impaired balance (sitting and/or standing);Pain         OT Goals(Current goals can be found in the care plan section) Acute Rehab OT Goals Patient Stated Goal: go home today OT Goal Formulation: All assessment and education complete, DC therapy   AM-PAC OT "6 Clicks" Daily Activity     Outcome Measure Help from another person eating meals?: None Help from another person taking care of personal grooming?: None Help from another person toileting, which includes using toliet, bedpan, or urinal?: A Little Help from another person bathing (including washing, rinsing, drying)?: A Little Help from another person to put on and taking off regular upper body clothing?: None Help from another person to put on and taking off regular lower body clothing?: A Little 6 Click Score: 21   End of Session Equipment Utilized During Treatment: Gait belt;Rolling walker Nurse Communication: Mobility status  Activity Tolerance: Patient tolerated treatment well Patient left: in chair;with call bell/phone within reach  OT Visit Diagnosis: Unsteadiness on feet (R26.81);Pain Pain - Right/Left: Left Pain - part of body: Leg                Time: 0801-0822 OT Time Calculation (min): 21 min Charges:  OT General Charges $OT Visit: 1 Visit OT Evaluation $OT Eval Low Complexity: [place], [place] 02/27/2019, 8:35 AM

## 2019-02-27 NOTE — Discharge Summary (Addendum)
Name: [name] [name] MRN: [id number] DOB: [birth year] 58 y.o. PCP: Jean Rosenthal, MD  Date of Admission: 02/19/2019  3:32 PM Date of Discharge: 02/27/2019 Attending Physician: Axel Filler, *  Discharge Diagnosis: 1. L big toe ischemia 2. Hypertension 3. Smoking cessation   Discharge Medications: Allergies as of 02/27/2019      Reactions   Morphine And Related Rash   Ramipril Rash   Per patient on R arm and leg only; no angioedema   Tramadol Itching, Rash      Medication List    TAKE these medications   amLODipine 10 MG tablet Commonly known as: NORVASC TAKE 1 TABLET (10 MG TOTAL) BY MOUTH DAILY.   atorvastatin 80 MG tablet Commonly known as: LIPITOR Take 1 tablet (80 mg total) by mouth daily at 6 PM.   chlorthalidone 25 MG tablet Commonly known as: HYGROTON TAKE 1 TABLET BY MOUTH EVERY DAY   docusate sodium 100 MG capsule Commonly known as: COLACE Take 1 capsule (100 mg total) by mouth daily.   naloxone 2 MG/2ML injection Commonly known as: NARCAN Inject 2 mg into the muscle once as needed (opiod overdose (call 911, inject intramuscularly in shoulder or thigh. Repeat every 3 minutes)).   nicotine 21 mg/24hr patch Commonly known as: NICODERM CQ - dosed in mg/24 hours Place 1 patch (21 mg total) onto the skin daily. Start taking on: February 28, 2019   oxyCODONE-acetaminophen 10-325 MG tablet Commonly known as: PERCOCET Take 1 tablet by mouth 5 (five) times daily. What changed: Another medication with the same name was added. Make sure you understand how and when to take each.   oxyCODONE-acetaminophen 5-325 MG tablet Commonly known as: PERCOCET/ROXICET Take 1 tablet by mouth every 4 (four) hours as needed for up to 7 days for moderate pain or severe pain. What changed: You were already taking a medication with the same name, and this prescription was added. Make sure you understand how and when to take each.   pantoprazole 40 MG tablet Commonly  known as: PROTONIX Take 1 tablet (40 mg total) by mouth daily. Start taking on: February 28, 2019   polyethylene glycol 17 g packet Commonly known as: MIRALAX / GLYCOLAX Take 17 g by mouth daily as needed for mild constipation.   predniSONE 5 MG tablet Commonly known as: DELTASONE TAKE 1 TABLET BY MOUTH EVERY DAY What changed: when to take this   REFRESH DRY EYE THERAPY OP Place 1 drop into both eyes 3 (three) times daily as needed (dry/irritated eyes.).      Disposition and follow-up:   [name] was discharged from [facility] in fair condition.  At the hospital follow up visit please address:  1. L big toe ischemia, s/p L femoral popliteal artery bypass: Surgery preformed on 9/2 and was successful. Dorsalis pedis pulses were restored to the L foot. Please follow this up and make sure surgical scars are healing appropriately. Patient should be taking the following medications: - Aspirin 81 mg daily - Lipitor 80 mg daily  - Continue home Percocet 10 mg up to 5 times a day - Prescribed additional Percocet 10 mg q4hr PRN for breakthrough pain   2. Hypertension: Continue the following medications -Amlodipine10 mgdaily - Chlorthalidone 25 mg daily   3. Smoking cessation: Please reinforce importance of no more smoking. Nicotine patches were provided upon discharge. Consider exploring other modalities of smoking cessation.   3.  Labs / imaging needed at time of  follow-up: CBC, BMP  4.  Pending labs/ test needing follow-up: none   Follow-up Appointments: Follow-up Information    Serafina Mitchell, MD In 3 weeks.   Specialties: Vascular Surgery, Cardiology Why: Office will call you to arrange your appt (sent) Contact information: [place] [address] [phone]        Jean Rosenthal, MD. Go to.   Specialty: Internal Medicine Contact information: [address] [address] [phone]        Dorothy Spark, MD .   Specialty: Cardiology Contact information: [address] [place] [address] [phone]           Hospital Course by problem list: 1. L big toe ischemia s/p L femoral popliteal artery bypass: Patient initially presented with 2-week progression of left great toe pain and discoloration, raising concerns of progression of known PVD and toe ischemia.  Vascular surgery performed an aortogram, arteriogram and vein mapping (results noted below) in preparation for left femoral popliteal artery bypass which was successful on 9/2.  Patient had ABIs and TBI's done at the hospital.  On day of discharge, the patient had a dorsalis pedis pulse present and color of the toe was much improved.  The following new medications were prescribed, and should be taken long-term. - Aspirin 81 mg daily - Lipitor 80 mg daily - Continue home Percocet 10 mg up to 5 times a day - Prescribed additional Percocet 10 mg q4hr PRN for breakthrough pain    2. Hypertension: Patient had good control of his blood pressures throughout his stay.  We continued his home regimen of amlodipine 10 mg and chlorthalidone 25 mg daily.  3. Smoking cessation: We counseled the patient about the importance of smoking cessation.  He says he understands that he has to stop.  He was discharged with nicotine patches, and encouraged to follow-up with his PCP on smoking cessation options.   Discharge Vitals:   BP 121/82 (BP Location: Left Arm)   Pulse 83   Temp 98.1 F (36.7 C) (Oral)   Resp 18   Ht 5\' 6"  (1.676 m)   Wt 81.4 kg   SpO2 99%   BMI 28.96 kg/m   Pertinent Labs, Studies, and Procedures:  CBC Latest Ref Rng & Units 02/27/2019 02/26/2019 02/25/2019  WBC 4.0 - 10.5 K/uL 6.6 5.7 5.7  Hemoglobin 13.0 - 17.0 g/dL 11.9(L) 13.8 13.8  Hematocrit 39.0 - 52.0 % 34.8(L) 41.9 42.0  Platelets 150 - 400 K/uL 137(L) 168 173   BMP Latest Ref Rng & Units 02/27/2019 02/26/2019 02/23/2019  Glucose 70 - 99 mg/dL  207(H) 94 107(H)  BUN 6 - 20 mg/dL 15 19 13   Creatinine 0.61 - 1.24 mg/dL 0.97 0.82 0.75  BUN/Creat Ratio 9 - 20 - - -  Sodium 135 - 145 mmol/L 135 136 139  Potassium 3.5 - 5.1 mmol/L 3.9 3.5 4.0  Chloride 98 - 111 mmol/L 100 99 105  CO2 22 - 32 mmol/L 23 23 25   Calcium 8.9 - 10.3 mg/dL 8.5(L) 9.3 9.2   Abdominal Aortogram w/ Lower Extremity: FINDINGS: Aortogram showed approximate 50% stenosis of the right distal external proximal common femoral artery otherwise the rest of the aortoiliac segment was widely patent.  Left lower extremity arteriogram which is the side of interest showed a patent common femoral and profunda with a flush left SFA occlusion. Ultimately he reconstituted his above-knee popliteal artery but then appeared to have a  large calcified plaque behind the knee the below-knee popliteal artery was widely patent with single-vessel runoff via the anterior tibial artery.  Right lower extreme arteriogram showed a patent common femoral, profunda, SFA above and below-knee popliteal artery and single-vessel runoff via a anterior tibial that was very robust. This did appear to occlude above the ankle with collaterals that filledthe peroneal distally.  Echocardiogram: IMPRESSIONS  1. The left ventricle has low normal systolic function, with an ejection fraction of 50-55%. The cavity size was normal. Left ventricular diastolic Doppler parameters are consistent with impaired relaxation.  2. The right ventricle has normal systolic function. The cavity was normal. There is no increase in right ventricular wall thickness. Right ventricular systolic pressure is normal with an estimated pressure of 11.4 mmHg.  3. The aortic valve is tricuspid. Mild thickening of the aortic valve. Mild calcification of the aortic valve. Aortic valve regurgitation is trivial by color flow Doppler. No stenosis of the aortic valve.  4. The aorta is normal unless otherwise noted.   Discharge Instructions:    Signed: Earlene Plater, MD Internal Medicine, PGY1 Pager: [phone]  02/27/2019,2:34 PM

## 2019-02-27 NOTE — Progress Notes (Signed)
   Subjective: Pt seen at the bedside on rounds this AM. Patient reports toe feels a little sore, cold, but the pain is tolerable. Reports therapist came by this morning after the surgery and said he is doing well. Patient states he feels comfortable with returning home today. Patient was counseled on need to quit smoking, informed on new medications of aspirin and cholesterol medications that are important to take to help maintain blood flow. Patient says he previously quit smoking for a year and will try not to smoke anymore.   Objective:  Vital signs in last 24 hours: Vitals:   02/26/19 2000 02/26/19 2038 02/26/19 2338 02/27/19 0437  BP: 107/81  112/78 108/87  Pulse: 85 87 84 83  Resp: (!) 22 (!) 21 19 20   Temp:  98 F (36.7 C) 98.2 F (36.8 C) 98.2 F (36.8 C)  TempSrc:  Oral Oral Oral  SpO2: 94% 95% 95% 98%  Weight:      Height:       Physical Exam: General: Well appearing, sitting up in the bedside chair HEENT: NCAT CV: RRR, Normal S1 & S2, no murmurs rubs or gallops appreciated. L Dorsalis pedis pulse intact PULM: Normal work of breathing NEURO: Alert and oriented, no focal deficits noted  Assessment/Plan:  Principal Problem:   Critical lower limb ischemia Active Problems:   HTN (hypertension)   PAD (peripheral artery disease) (HCC)   Ischemic ulcer of toe of left foot (HCC)  In summary, Mr. Septer is a 58 year old male with a history of HTN, HLD, CAD, PVD, tobacco abuse, and RA who presented with 2 weeks progression of left great toe pain and discoloration. Post op day 1 with Vascular Surgery for successful L femoral popliteal artery bypass.  #CAD #HLD #L great toe ischemia: Post-op day 1 L femoral below knee popliteal artery bypass with Vascular Surgery - d/c IV Dilaudid - Percocet 5-325 mg 2 tablets q4hr PRN - Tylenol 650 mg q4hr PRN - Continue Atorvastatin  - Plan to discharge with daily aspirin 81 mg  - Discuss smoking cessation. Pt says he understands he  should not smoke anymore.  #HTN -Amlodipine10 mgdaily - Chlorthalidone 25 mg QD  #Rheumatoid Arthritis -Rheumatology referralplaced by PCP. - Prednisone 5mg  daily   #DVT prophylaxis - IV heparin   Dispo:Anticipated discharge today.   Earlene Plater, MD Internal Medicine, PGY1 Pager: [phone]  02/27/2019,12:52 PM

## 2019-02-27 NOTE — Progress Notes (Addendum)
[facility]  Subjective  - Doing well overall, just soreness.   Objective 108/87 83 98.2 F (36.8 C) (Oral) 20 98%  Intake/Output Summary (Last 24 hours) at 02/27/2019 0719 Last data filed at 02/27/2019 0534 Gross per 24 hour  Intake 2975.28 ml  Output 1965 ml  Net 1010.28 ml    Incisions healing well, left groin soft without hematoma Doppler DP/AT and peroneal left LE Lungs non labored breathing Heart RRR  Assessment/Planning: POD # 1  Procedure:   #1: Left common femoral to below-knee popliteal artery bypass graft with 6 mm external ring propatent PTFE                         #2: Harvest of left great saphenous vein  Plan to discharge once he ambulatory and pain is well controlled He will be discharged Lipitor 80 mg he is currently no on an antiplatelet.    Roxy Horseman 02/27/2019 7:19 AM --  Laboratory Lab Results: Recent Labs    02/26/19 0301 02/27/19 0247  WBC 5.7 6.6  HGB 13.8 11.9*  HCT 41.9 34.8*  PLT 168 137*   BMET Recent Labs    02/26/19 0301 02/27/19 0247  NA 136 135  K 3.5 3.9  CL 99 100  CO2 23 23  GLUCOSE 94 207*  BUN 19 15  CREATININE 0.82 0.97  CALCIUM 9.3 8.5*    COAG Lab Results  Component Value Date   INR 1.0 02/26/2019   INR 1.0 02/19/2019   INR 1.0 03/13/2017   No results found for: PTT  I agree with the above.  Brisk AT doppler signal Will need ASA 81 mg and Plavix at discharge along with statin   Annamarie Major

## 2019-02-27 NOTE — Anesthesia Postprocedure Evaluation (Signed)
Anesthesia Post Note  Patient: [name] [name]  Procedure(s) Performed: BYPASS GRAFT FEMORAL-POPLITEAL ARTERY LEFT LEG USING 71mm PROPATEN GRAFT (Left )     Patient location during evaluation: PACU Anesthesia Type: General Level of consciousness: sedated and patient cooperative Pain management: pain level controlled Vital Signs Assessment: post-procedure vital signs reviewed and stable Respiratory status: spontaneous breathing Cardiovascular status: stable Anesthetic complications: no    Last Vitals:  Vitals:   02/27/19 0831 02/27/19 1207  BP: (!) 132/92 121/82  Pulse: 82 83  Resp: 18 18  Temp: 36.7 C 36.7 C  SpO2: 97% 99%    Last Pain:  Vitals:   02/27/19 1207  TempSrc: Oral  PainSc:                  Nolon Nations

## 2019-02-27 NOTE — Progress Notes (Signed)
Bilateral ABIs and TBIs completed. Preliminary results in Chart review CV Proc. [place] Shadow Stiggers,RVS 02/27/2019,10:42 PM

## 2019-02-28 ENCOUNTER — Telehealth: Payer: Self-pay | Admitting: Internal Medicine

## 2019-02-28 DIAGNOSIS — I739 Peripheral vascular disease, unspecified: Secondary | ICD-10-CM

## 2019-02-28 MED ORDER — CLOPIDOGREL BISULFATE 75 MG PO TABS: 75.0000 mg | ORAL_TABLET | Freq: Every day | ORAL | 11 refills | Status: DC

## 2019-02-28 NOTE — Telephone Encounter (Signed)
Called the patient but did not get an answer. I explained over voicemail that there is an additional medication (Plavix) we would like for him to start taking every day and that [facility]. If he has any questions he can call back at [phone]  Earlene Plater, MD Internal Medicine, PGY1 Pager: [phone]  02/28/2019,4:53 PM

## 2019-03-01 LAB — BPAM RBC
Blood Product Expiration Date: 202010092359
Unit Type and Rh: 5100

## 2019-03-01 LAB — TYPE AND SCREEN
ABO/RH(D): O POS
Antibody Screen: NEGATIVE
Unit division: 0

## 2019-03-05 ENCOUNTER — Encounter: Payer: Medicare Other | Admitting: Internal Medicine

## 2019-03-06 ENCOUNTER — Ambulatory Visit (INDEPENDENT_AMBULATORY_CARE_PROVIDER_SITE_OTHER): Payer: Medicare Other | Admitting: Internal Medicine

## 2019-03-06 ENCOUNTER — Encounter: Payer: Self-pay | Admitting: Internal Medicine

## 2019-03-06 ENCOUNTER — Other Ambulatory Visit: Payer: Self-pay

## 2019-03-06 VITALS — BP 113/93 | HR 103 | Temp 97.6°F | Wt 182.3 lb

## 2019-03-06 DIAGNOSIS — E785 Hyperlipidemia, unspecified: Secondary | ICD-10-CM | POA: Diagnosis not present

## 2019-03-06 DIAGNOSIS — M0579 Rheumatoid arthritis with rheumatoid factor of multiple sites without organ or systems involvement: Secondary | ICD-10-CM

## 2019-03-06 DIAGNOSIS — Z0000 Encounter for general adult medical examination without abnormal findings: Secondary | ICD-10-CM

## 2019-03-06 DIAGNOSIS — I998 Other disorder of circulatory system: Secondary | ICD-10-CM

## 2019-03-06 DIAGNOSIS — I70229 Atherosclerosis of native arteries of extremities with rest pain, unspecified extremity: Secondary | ICD-10-CM

## 2019-03-06 DIAGNOSIS — Z23 Encounter for immunization: Secondary | ICD-10-CM | POA: Diagnosis not present

## 2019-03-06 DIAGNOSIS — Z79891 Long term (current) use of opiate analgesic: Secondary | ICD-10-CM

## 2019-03-06 DIAGNOSIS — I2510 Atherosclerotic heart disease of native coronary artery without angina pectoris: Secondary | ICD-10-CM

## 2019-03-06 DIAGNOSIS — I10 Essential (primary) hypertension: Secondary | ICD-10-CM

## 2019-03-06 DIAGNOSIS — M069 Rheumatoid arthritis, unspecified: Secondary | ICD-10-CM

## 2019-03-06 DIAGNOSIS — F17200 Nicotine dependence, unspecified, uncomplicated: Secondary | ICD-10-CM

## 2019-03-06 DIAGNOSIS — Z95820 Peripheral vascular angioplasty status with implants and grafts: Secondary | ICD-10-CM

## 2019-03-06 DIAGNOSIS — Z7982 Long term (current) use of aspirin: Secondary | ICD-10-CM

## 2019-03-06 DIAGNOSIS — F17201 Nicotine dependence, unspecified, in remission: Secondary | ICD-10-CM

## 2019-03-06 DIAGNOSIS — Z79899 Other long term (current) drug therapy: Secondary | ICD-10-CM

## 2019-03-06 MED ORDER — OXYCODONE-ACETAMINOPHEN 10-325 MG PO TABS: 1.0000 | ORAL_TABLET | Freq: Four times a day (QID) | ORAL | 0 refills | Status: AC | PRN

## 2019-03-06 NOTE — Addendum Note (Signed)
Addended by: Jean Rosenthal on: 03/06/2019 05:02 PM   Modules accepted: Orders

## 2019-03-06 NOTE — Progress Notes (Signed)
   CC: Follow-up left lower extremity critical limb ischemia status post femoropopliteal bypass  HPI:  [name] is a 58 y.o. very pleasant gentleman with medical history listed below presenting to follow-up on recent hospitalization for left big toe ischemia.  Please see problem based charting for further details.  Past Medical History:  Diagnosis Date  . Anemia   . Anemia 08/06/2012  . 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 Community acquired pneumonia 11/07/2011  . DDD (degenerative disc disease), lumbosacral     and grade 2 slip  . GERD (gastroesophageal reflux disease)   . Heart murmur   . History of anemia    no current med.  . Hypertension    states is borderline on medication; has been on med. x 5-6 yr.  . Hypokalemia 05/30/2018  . Impingement syndrome of shoulder region 10/2014   left  . Insomnia 06/01/2015  . Left shoulder pain 12/16/2015  . Low back pain without sciatica 10/29/2009  . Lupus ([place])   . Peripheral vascular disease ([place])   . Pneumonia   . RA (rheumatoid arthritis) ([place])   . Rotator cuff tear 11/04/2014   Review of Systems: As per HPI  Physical Exam:  Vitals:   03/06/19 1531  BP: (!) 113/93  Pulse: (!) 103  Temp: 97.6 F (36.4 C)  TempSrc: Oral  SpO2: 98%  Weight: 182 lb 4.8 oz (82.7 kg)   Physical Exam  Constitutional: He is well-developed, well-nourished, and in no distress.  HENT:  Head: Normocephalic and atraumatic.  Eyes: Conjunctivae are normal.  Cardiovascular: Normal rate and regular rhythm.  No murmur heard. Pulmonary/Chest: Effort normal and breath sounds normal. He has no wheezes.  Musculoskeletal:        General: Edema: 1-2+ edema at the Left foot, capillary refill <2 secs.    PRIOR TO BYPASS SURGERY:     Assessment & Plan:   See Encounters Tab for problem based charting.  Patient discussed with Dr. Dareen Piano

## 2019-03-06 NOTE — Assessment & Plan Note (Signed)
Tobacco use disorder: He has stopped smoking cigarettes.

## 2019-03-06 NOTE — Assessment & Plan Note (Addendum)
Left great toe critical limb ischemia status post femoropopliteal bypass: [name] [name] has medical history significant for hypertension, tobacco use disorder, hyperlipidemia, coronary artery disease and rheumatoid arthritis.  He was recently admitted to the hospital from February 19, 2019 to  February 27, 2019 after he was found to have an acute critical limb ischemia of his left great toe.  During his hospitalization, he successfully underwent femoropopliteal bypass with restoration of dorsalis pedis pulse.  For re-stratification, he was discharged on aspirin 81 mg daily, Lipitor 80 mg daily and Percocet for pain medication.  At his clinic visit today, he endorses pain at the left foot with ambulation and also sometimes at rest.  He ran out of his pain medication a couple days ago.  He states he has been trying to reach vascular surgery but has been unable to.  ---> AFTER BYPASS -->    On physical exams, left foot has good blood flow and capillary refill is intact.  The femoral-popliteal bypass incision is clean.  Plan: - Follow vascular surgery (03/17/2019 at 2:15 PM) - Given refill for Percocet 10-325 mg #20 (he is to call his pain medication clinic and follow-up next week) - Continue Aspirin and Lipitor

## 2019-03-06 NOTE — Patient Instructions (Signed)
[name] [name],  It was a pleasure taking care of your clinic today.  Regarding the pain you have been having at the left toe, I have given you a 5-day course of the Percocet.  Please call your pain medication clinic and have close follow-up.  I would also like for you to see the vascular surgeon for follow-up.  Take care! Dr. Eileen Stanford  Please call the internal medicine center clinic if you have any questions or concerns, we may be able to help and keep you from a long and expensive emergency room wait. Our clinic and after hours phone number is [phone], the best time to call is Monday through Friday 9 am to 4 pm but there is always someone available 24/7 if you have an emergency. If you need medication refills please notify your pharmacy one week in advance and they will send [place] a request.

## 2019-03-06 NOTE — Assessment & Plan Note (Signed)
Hypertension-well-controlled: He is at goal  BP Readings from Last 3 Encounters:  03/06/19 (!) 113/93  02/27/19 121/82  02/19/19 (!) 134/94   Plan: - Continue amlodipine 10 mg daily, chlorthalidone 25 mg daily

## 2019-03-06 NOTE — Assessment & Plan Note (Signed)
Health maintenance: We will administer flu vaccine today.

## 2019-03-07 NOTE — Progress Notes (Signed)
Internal Medicine Clinic Attending  Case discussed with Dr. Agyei at the time of the visit.  We reviewed the resident's history and exam and pertinent patient test results.  I agree with the assessment, diagnosis, and plan of care documented in the resident's note.    

## 2019-03-11 ENCOUNTER — Other Ambulatory Visit: Payer: Self-pay | Admitting: Internal Medicine

## 2019-03-11 DIAGNOSIS — M0579 Rheumatoid arthritis with rheumatoid factor of multiple sites without organ or systems involvement: Secondary | ICD-10-CM

## 2019-03-13 ENCOUNTER — Other Ambulatory Visit: Payer: Self-pay | Admitting: *Deleted

## 2019-03-13 DIAGNOSIS — I10 Essential (primary) hypertension: Secondary | ICD-10-CM

## 2019-03-15 MED ORDER — AMLODIPINE BESYLATE 10 MG PO TABS: 10.0000 mg | ORAL_TABLET | Freq: Every day | ORAL | 1 refills | Status: DC

## 2019-03-17 ENCOUNTER — Encounter: Payer: Medicare Other | Admitting: Family

## 2019-03-18 ENCOUNTER — Other Ambulatory Visit: Payer: Self-pay | Admitting: *Deleted

## 2019-03-18 DIAGNOSIS — M5137 Other intervertebral disc degeneration, lumbosacral region: Secondary | ICD-10-CM | POA: Diagnosis not present

## 2019-03-18 DIAGNOSIS — G894 Chronic pain syndrome: Secondary | ICD-10-CM | POA: Diagnosis not present

## 2019-03-18 DIAGNOSIS — Z79891 Long term (current) use of opiate analgesic: Secondary | ICD-10-CM | POA: Diagnosis not present

## 2019-03-18 DIAGNOSIS — M7060 Trochanteric bursitis, unspecified hip: Secondary | ICD-10-CM | POA: Diagnosis not present

## 2019-03-18 DIAGNOSIS — M47817 Spondylosis without myelopathy or radiculopathy, lumbosacral region: Secondary | ICD-10-CM | POA: Diagnosis not present

## 2019-03-18 DIAGNOSIS — Z79899 Other long term (current) drug therapy: Secondary | ICD-10-CM | POA: Diagnosis not present

## 2019-03-18 DIAGNOSIS — M0579 Rheumatoid arthritis with rheumatoid factor of multiple sites without organ or systems involvement: Secondary | ICD-10-CM

## 2019-03-19 MED ORDER — PREDNISONE 5 MG PO TABS: 5.0000 mg | ORAL_TABLET | Freq: Every day | ORAL | 3 refills | Status: DC

## 2019-04-14 ENCOUNTER — Other Ambulatory Visit: Payer: Self-pay

## 2019-04-14 NOTE — Patient Outreach (Signed)
[facility]) Care Management  04/14/2019  [name] [name] July 24, 1960 [id number]   Medication Adherence call to [name] [facility] Identifiers Verify spoke with patient he is past due on Atorvastatin 80 mg patient explain he takes 1 tablet daily and has a a few more and has already order it and [facility]. [name] [name] is showing past due under [place].   [place] Management Direct Dial [phone]  Fax [phone] [email or web address] [email or web address]

## 2019-04-15 DIAGNOSIS — G894 Chronic pain syndrome: Secondary | ICD-10-CM | POA: Diagnosis not present

## 2019-04-15 DIAGNOSIS — M7060 Trochanteric bursitis, unspecified hip: Secondary | ICD-10-CM | POA: Diagnosis not present

## 2019-04-15 DIAGNOSIS — M069 Rheumatoid arthritis, unspecified: Secondary | ICD-10-CM | POA: Diagnosis not present

## 2019-04-15 DIAGNOSIS — M5137 Other intervertebral disc degeneration, lumbosacral region: Secondary | ICD-10-CM | POA: Diagnosis not present

## 2019-05-01 DIAGNOSIS — M1710 Unilateral primary osteoarthritis, unspecified knee: Secondary | ICD-10-CM | POA: Diagnosis not present

## 2019-05-10 ENCOUNTER — Encounter (HOSPITAL_COMMUNITY): Payer: Self-pay | Admitting: Emergency Medicine

## 2019-05-10 ENCOUNTER — Emergency Department (HOSPITAL_COMMUNITY): Payer: Medicare Other

## 2019-05-10 ENCOUNTER — Other Ambulatory Visit: Payer: Self-pay

## 2019-05-10 ENCOUNTER — Inpatient Hospital Stay (HOSPITAL_COMMUNITY)
Admission: EM | Admit: 2019-05-10 | Discharge: 2019-05-15 | DRG: 177 | Disposition: A | Discharge status: Home / Self Care | Payer: Medicare Other | Attending: Internal Medicine | Admitting: Internal Medicine

## 2019-05-10 DIAGNOSIS — J1289 Other viral pneumonia: Secondary | ICD-10-CM | POA: Diagnosis not present

## 2019-05-10 DIAGNOSIS — M069 Rheumatoid arthritis, unspecified: Secondary | ICD-10-CM | POA: Diagnosis not present

## 2019-05-10 DIAGNOSIS — R0603 Acute respiratory distress: Secondary | ICD-10-CM | POA: Diagnosis present

## 2019-05-10 DIAGNOSIS — I739 Peripheral vascular disease, unspecified: Secondary | ICD-10-CM | POA: Diagnosis present

## 2019-05-10 DIAGNOSIS — Z885 Allergy status to narcotic agent status: Secondary | ICD-10-CM | POA: Diagnosis not present

## 2019-05-10 DIAGNOSIS — E876 Hypokalemia: Secondary | ICD-10-CM | POA: Diagnosis not present

## 2019-05-10 DIAGNOSIS — Z20822 Contact with and (suspected) exposure to covid-19: Secondary | ICD-10-CM | POA: Diagnosis present

## 2019-05-10 DIAGNOSIS — M5137 Other intervertebral disc degeneration, lumbosacral region: Secondary | ICD-10-CM | POA: Diagnosis present

## 2019-05-10 DIAGNOSIS — Z7902 Long term (current) use of antithrombotics/antiplatelets: Secondary | ICD-10-CM

## 2019-05-10 DIAGNOSIS — Z888 Allergy status to other drugs, medicaments and biological substances status: Secondary | ICD-10-CM | POA: Diagnosis not present

## 2019-05-10 DIAGNOSIS — E785 Hyperlipidemia, unspecified: Secondary | ICD-10-CM | POA: Diagnosis not present

## 2019-05-10 DIAGNOSIS — E860 Dehydration: Secondary | ICD-10-CM | POA: Diagnosis present

## 2019-05-10 DIAGNOSIS — U071 COVID-19: Secondary | ICD-10-CM | POA: Diagnosis not present

## 2019-05-10 DIAGNOSIS — I10 Essential (primary) hypertension: Secondary | ICD-10-CM | POA: Diagnosis not present

## 2019-05-10 DIAGNOSIS — Z7952 Long term (current) use of systemic steroids: Secondary | ICD-10-CM

## 2019-05-10 DIAGNOSIS — I2510 Atherosclerotic heart disease of native coronary artery without angina pectoris: Secondary | ICD-10-CM | POA: Diagnosis present

## 2019-05-10 DIAGNOSIS — K219 Gastro-esophageal reflux disease without esophagitis: Secondary | ICD-10-CM | POA: Diagnosis present

## 2019-05-10 DIAGNOSIS — Z20828 Contact with and (suspected) exposure to other viral communicable diseases: Secondary | ICD-10-CM

## 2019-05-10 DIAGNOSIS — M329 Systemic lupus erythematosus, unspecified: Secondary | ICD-10-CM | POA: Diagnosis not present

## 2019-05-10 DIAGNOSIS — F17210 Nicotine dependence, cigarettes, uncomplicated: Secondary | ICD-10-CM | POA: Diagnosis present

## 2019-05-10 DIAGNOSIS — Z8249 Family history of ischemic heart disease and other diseases of the circulatory system: Secondary | ICD-10-CM | POA: Diagnosis not present

## 2019-05-10 DIAGNOSIS — R0602 Shortness of breath: Secondary | ICD-10-CM | POA: Diagnosis not present

## 2019-05-10 DIAGNOSIS — R52 Pain, unspecified: Secondary | ICD-10-CM | POA: Diagnosis not present

## 2019-05-10 DIAGNOSIS — M79604 Pain in right leg: Secondary | ICD-10-CM

## 2019-05-10 DIAGNOSIS — R069 Unspecified abnormalities of breathing: Secondary | ICD-10-CM | POA: Diagnosis not present

## 2019-05-10 DIAGNOSIS — Z743 Need for continuous supervision: Secondary | ICD-10-CM | POA: Diagnosis not present

## 2019-05-10 DIAGNOSIS — L97529 Non-pressure chronic ulcer of other part of left foot with unspecified severity: Secondary | ICD-10-CM | POA: Diagnosis present

## 2019-05-10 HISTORY — DX: COVID-19: U07.1

## 2019-05-10 LAB — I-STAT CHEM 8, ED
BUN: 14 mg/dL (ref 6–20)
Calcium, Ion: 1.12 mmol/L — ABNORMAL LOW (ref 1.15–1.40)
Chloride: 92 mmol/L — ABNORMAL LOW (ref 98–111)
Creatinine, Ser: 1 mg/dL (ref 0.61–1.24)
Glucose, Bld: 101 mg/dL — ABNORMAL HIGH (ref 70–99)
HCT: 47 % (ref 39.0–52.0)
Hemoglobin: 16 g/dL (ref 13.0–17.0)
Potassium: 2.9 mmol/L — ABNORMAL LOW (ref 3.5–5.1)
Sodium: 136 mmol/L (ref 135–145)
TCO2: 30 mmol/L (ref 22–32)

## 2019-05-10 LAB — COMPREHENSIVE METABOLIC PANEL
ALT: 30 U/L (ref 0–44)
AST: 27 U/L (ref 15–41)
Albumin: 3.3 g/dL — ABNORMAL LOW (ref 3.5–5.0)
Alkaline Phosphatase: 87 U/L (ref 38–126)
Anion gap: 16 — ABNORMAL HIGH (ref 5–15)
BUN: 12 mg/dL (ref 6–20)
CO2: 27 mmol/L (ref 22–32)
Calcium: 8.8 mg/dL — ABNORMAL LOW (ref 8.9–10.3)
Chloride: 92 mmol/L — ABNORMAL LOW (ref 98–111)
Creatinine, Ser: 0.95 mg/dL (ref 0.61–1.24)
GFR calc Af Amer: >60 mL/min (ref >60)
GFR calc non Af Amer: >60 mL/min (ref >60)
Glucose, Bld: 103 mg/dL — ABNORMAL HIGH (ref 70–99)
Potassium: 2.9 mmol/L — ABNORMAL LOW (ref 3.5–5.1)
Sodium: 135 mmol/L (ref 135–145)
Total Bilirubin: 0.4 mg/dL (ref 0.3–1.2)
Total Protein: 7.7 g/dL (ref 6.5–8.1)

## 2019-05-10 LAB — D-DIMER, QUANTITATIVE: D-Dimer, Quant: 2.25 ug/mL-FEU — ABNORMAL HIGH (ref 0.00–0.50)

## 2019-05-10 LAB — CBC WITH DIFFERENTIAL/PLATELET
Abs Immature Granulocytes: 0.02 10*3/uL (ref 0.00–0.07)
Basophils Absolute: 0 10*3/uL (ref 0.0–0.1)
Basophils Relative: 0 %
Eosinophils Absolute: 0 10*3/uL (ref 0.0–0.5)
Eosinophils Relative: 0 %
HCT: 45.3 % (ref 39.0–52.0)
Hemoglobin: 14.2 g/dL (ref 13.0–17.0)
Immature Granulocytes: 0 %
Lymphocytes Relative: 16 %
Lymphs Abs: 0.8 10*3/uL (ref 0.7–4.0)
MCH: 26.1 pg (ref 26.0–34.0)
MCHC: 31.3 g/dL (ref 30.0–36.0)
MCV: 83.3 fL (ref 80.0–100.0)
Monocytes Absolute: 0.3 10*3/uL (ref 0.1–1.0)
Monocytes Relative: 7 %
Neutro Abs: 3.8 10*3/uL (ref 1.7–7.7)
Neutrophils Relative %: 77 %
Platelets: 170 10*3/uL (ref 150–400)
RBC: 5.44 MIL/uL (ref 4.22–5.81)
RDW: 14.6 % (ref 11.5–15.5)
WBC: 4.9 10*3/uL (ref 4.0–10.5)
nRBC: 0 % (ref 0.0–0.2)

## 2019-05-10 LAB — LACTIC ACID, PLASMA
Lactic Acid, Venous: 1.5 mmol/L (ref 0.5–1.9)
Lactic Acid, Venous: 1.1 mmol/L (ref 0.5–1.9)

## 2019-05-10 LAB — TROPONIN I (HIGH SENSITIVITY)
Troponin I (High Sensitivity): 13 ng/L (ref <18)
Troponin I (High Sensitivity): 14 ng/L (ref <18)

## 2019-05-10 LAB — SARS CORONAVIRUS 2 (TAT 6-24 HRS): SARS Coronavirus 2: POSITIVE — AB

## 2019-05-10 LAB — TRIGLYCERIDES: Triglycerides: 186 mg/dL — ABNORMAL HIGH (ref <150)

## 2019-05-10 LAB — FERRITIN: Ferritin: 129 ng/mL (ref 24–336)

## 2019-05-10 LAB — C-REACTIVE PROTEIN: CRP: 4.4 mg/dL — ABNORMAL HIGH (ref <1.0)

## 2019-05-10 LAB — LACTATE DEHYDROGENASE: LDH: 185 U/L (ref 98–192)

## 2019-05-10 LAB — PROCALCITONIN: Procalcitonin: 0.1 ng/mL

## 2019-05-10 LAB — FIBRINOGEN: Fibrinogen: 630 mg/dL — ABNORMAL HIGH (ref 210–475)

## 2019-05-10 MED ORDER — POTASSIUM CHLORIDE CRYS ER 20 MEQ PO TBCR
40.0000 meq | EXTENDED_RELEASE_TABLET | Freq: Once | ORAL | Status: DC
  Filled 2019-05-10: qty 2

## 2019-05-10 MED ORDER — PREDNISONE 5 MG PO TABS
5.0000 mg | ORAL_TABLET | Freq: Every day | ORAL | Status: DC
  Filled 2019-05-10 (×2): qty 1

## 2019-05-10 MED ORDER — CHLORTHALIDONE 25 MG PO TABS
25.0000 mg | ORAL_TABLET | Freq: Every day | ORAL | Status: DC
  Administered 2019-05-11 – 2019-05-15 (×5): 25 mg via ORAL
  Filled 2019-05-10 (×7): qty 1

## 2019-05-10 MED ORDER — AMLODIPINE BESYLATE 10 MG PO TABS
10.0000 mg | ORAL_TABLET | Freq: Every day | ORAL | Status: DC
  Administered 2019-05-11 – 2019-05-15 (×5): 10 mg via ORAL
  Filled 2019-05-10 (×5): qty 1

## 2019-05-10 MED ORDER — OXYCODONE HCL 5 MG PO TABS
5.0000 mg | ORAL_TABLET | Freq: Every day | ORAL | Status: DC | PRN
  Administered 2019-05-10 – 2019-05-14 (×5): 5 mg via ORAL
  Filled 2019-05-10 (×6): qty 1

## 2019-05-10 MED ORDER — FENTANYL CITRATE (PF) 100 MCG/2ML IJ SOLN
50.0000 ug | Freq: Once | INTRAMUSCULAR | Status: AC
  Administered 2019-05-10: 50 ug via INTRAVENOUS
  Filled 2019-05-10: qty 2

## 2019-05-10 MED ORDER — CLOPIDOGREL BISULFATE 75 MG PO TABS
75.0000 mg | ORAL_TABLET | Freq: Every day | ORAL | Status: DC
  Administered 2019-05-11 – 2019-05-15 (×5): 75 mg via ORAL
  Filled 2019-05-10 (×5): qty 1

## 2019-05-10 MED ORDER — ENOXAPARIN SODIUM 40 MG/0.4ML ~~LOC~~ SOLN
40.0000 mg | SUBCUTANEOUS | Status: DC
  Administered 2019-05-10 – 2019-05-14 (×5): 40 mg via SUBCUTANEOUS
  Filled 2019-05-10 (×5): qty 0.4

## 2019-05-10 MED ORDER — ATORVASTATIN CALCIUM 40 MG PO TABS
80.0000 mg | ORAL_TABLET | Freq: Every day | ORAL | Status: DC
  Administered 2019-05-10 – 2019-05-14 (×5): 80 mg via ORAL
  Filled 2019-05-10 (×3): qty 2
  Filled 2019-05-10: qty 1
  Filled 2019-05-10: qty 2

## 2019-05-10 MED ORDER — POTASSIUM CHLORIDE CRYS ER 20 MEQ PO TBCR
40.0000 meq | EXTENDED_RELEASE_TABLET | Freq: Four times a day (QID) | ORAL | Status: AC
  Administered 2019-05-10 – 2019-05-11 (×2): 40 meq via ORAL
  Filled 2019-05-10: qty 2

## 2019-05-10 MED ORDER — ACETAMINOPHEN 325 MG PO TABS
650.0000 mg | ORAL_TABLET | Freq: Once | ORAL | Status: AC
  Administered 2019-05-10: 650 mg via ORAL
  Filled 2019-05-10: qty 2

## 2019-05-10 MED ORDER — PANTOPRAZOLE SODIUM 40 MG PO TBEC: 40.0000 mg | DELAYED_RELEASE_TABLET | Freq: Every day | ORAL | Status: DC

## 2019-05-10 MED ORDER — OXYCODONE-ACETAMINOPHEN 10-325 MG PO TABS: 1.0000 | ORAL_TABLET | ORAL | Status: DC

## 2019-05-10 MED ORDER — DOCUSATE SODIUM 100 MG PO CAPS
100.0000 mg | ORAL_CAPSULE | Freq: Every day | ORAL | Status: DC
  Administered 2019-05-11 – 2019-05-15 (×4): 100 mg via ORAL
  Filled 2019-05-10 (×5): qty 1

## 2019-05-10 MED ORDER — NICOTINE 21 MG/24HR TD PT24
21.0000 mg | MEDICATED_PATCH | Freq: Every day | TRANSDERMAL | Status: DC
  Filled 2019-05-10 (×2): qty 1

## 2019-05-10 MED ORDER — OXYCODONE-ACETAMINOPHEN 5-325 MG PO TABS
1.0000 | ORAL_TABLET | Freq: Every day | ORAL | Status: DC | PRN
  Administered 2019-05-11 – 2019-05-15 (×20): 1 via ORAL
  Filled 2019-05-10 (×20): qty 1

## 2019-05-10 MED ORDER — SODIUM CHLORIDE 0.9 % IV BOLUS
1000.0000 mL | Freq: Once | INTRAVENOUS | Status: AC
  Administered 2019-05-10: 1000 mL via INTRAVENOUS

## 2019-05-10 NOTE — ED Triage Notes (Addendum)
Pt to triage via [facility]> States he had a negative COVID test within the last 2 weeks.  C/o SOB, chills, and loss of taste since Monday. Reports R leg pain since Sunday.  Denies known injury.

## 2019-05-10 NOTE — H&P (Signed)
Date: 05/10/2019               Patient Name:  [name] [name] MRN: [id number]  DOB: May 25, 1961 Age / Sex: 58 y.o., male   PCP: Jean Rosenthal, MD         Medical Service: Internal Medicine Teaching Service         Attending Physician: Dr. Velna Ochs, MD    First Contact: Dr. Ladona Horns Pager: [phone]  Second Contact: Dr. Nathanial Rancher Pager: [phone]       After Hours (After 5p/  First Contact Pager: [phone]  weekends / holidays): Second Contact Pager: [phone]   Chief Complaint: shortness of breath  History of Present Illness: [name] [name] is a 58 year old with significant PMH of tobacco use disorder, HTN, critical limb ischemia, rheumatoid arthritis, and chronic low back pain who presented for worsening shortness of breath. He states that he was in his usual state of health until about 5 days ago when he began experiencing dyspnea. He "felt bad" this morning after experiencing a couple days of cold sweats. He also endorses decreased appetite and states since Monday he has only had one sandwich, states he feels dehydrated and weak. He endorses ageusia, cough which improves with supplemental oxygen, myalgia, fever, chest pain which also started today. Describes it as "someone hitting me in the chest." The pain is intermittent. His roommate recently tested positive for COVID several weeks ago and is currently hospitalized. [name] [name] had a COVID test subsequently which was unremarkable.  Of note, he was recently admitted to the hospital in September for critical ischemia of L big toe and underwent femoral popliteal artery bypass with restoration of dorsalis pedis pulses.  Upon presentation to the ED today, he was on 2L [place] for comfort per EMS as his O2 saturation remained at 100. Febrile to 100.5. Non-tachycardic and normotensive. COVID-19 pending. Lab work unremarkable without leukocytosis, anemia, or elevated lactate. Potassium low at 2.9. He ambulated for 51ft in his room and became  tachycardic to 120 and tachypnic to 30. Though O2 sat remained above 96%, pt felt subjectively short of breath so 2L [place] was placed. Internal medicine called for admission.   Meds:  Current Meds  Medication Sig  . amLODipine (NORVASC) 10 MG tablet Take 1 tablet (10 mg total) by mouth daily.  . chlorthalidone (HYGROTON) 25 MG tablet TAKE 1 TABLET BY MOUTH EVERY DAY (Patient taking differently: Take 25 mg by mouth daily. )  . clopidogrel (PLAVIX) 75 MG tablet Take 1 tablet (75 mg total) by mouth daily.  [name] docusate sodium (COLACE) 100 MG capsule Take 1 capsule (100 mg total) by mouth daily. (Patient taking differently: Take 100 mg by mouth daily as needed for mild constipation. )  . Glycerin-Polysorbate 80 (REFRESH DRY EYE THERAPY OP) Place 1 drop into both eyes 3 (three) times daily as needed (dry/irritated eyes.).  [name] naloxone (NARCAN) 2 MG/2ML injection Inject 2 mg into the muscle once as needed (opiod overdose (call 911, inject intramuscularly in shoulder or thigh. Repeat every 3 minutes)).   [name] oxyCODONE-acetaminophen (PERCOCET) 10-325 MG tablet Take 1 tablet by mouth See admin instructions. Five times daily as needed for pain.  . polyethylene glycol (MIRALAX / GLYCOLAX) 17 g packet Take 17 g by mouth daily as needed for mild constipation.  . predniSONE (DELTASONE) 5 MG tablet Take 1 tablet (5 mg total) by mouth daily with breakfast.   Allergies: Allergies as of 05/10/2019 - Review  Complete 05/10/2019  Allergen Reaction Noted  . Morphine and related Rash 08/03/2017  . Ramipril Rash 06/23/2011  . Tramadol Itching and Rash 06/23/2011   Past Medical History:  Diagnosis Date  . Anemia   . Anemia 08/06/2012  . 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 Community acquired pneumonia 11/07/2011  . DDD (degenerative disc disease), lumbosacral     and grade 2 slip  . GERD (gastroesophageal reflux disease)   . Heart murmur   . History of anemia    no current med.  . Hypertension    states is borderline on  medication; has been on med. x 5-6 yr.  . Hypokalemia 05/30/2018  . Impingement syndrome of shoulder region 10/2014   left  . Insomnia 06/01/2015  . Left shoulder pain 12/16/2015  . Low back pain without sciatica 10/29/2009  . Lupus ([place])   . Peripheral vascular disease ([place])   . Pneumonia   . RA (rheumatoid arthritis) ([place])   . Rotator cuff tear 11/04/2014   Family History:  Father with diabetes and alcoholism Mother with HTN No family history of chronic lung disease or asthma.   Social History:  Ex-smoker, last cigarette was in September after fem-pop bypass. No EtOH use. Lives in [place] with his roommate   Review of Systems: Review of Systems  Constitutional: Positive for chills and fever.  HENT: Negative for congestion and sore throat.   Eyes: Negative for blurred vision and double vision.  Respiratory: Positive for cough and shortness of breath. Negative for sputum production.   Cardiovascular: Negative for chest pain and palpitations.  Gastrointestinal: Positive for abdominal pain. Negative for nausea and vomiting.  Genitourinary: Negative for dysuria and urgency.  Musculoskeletal: Negative for falls.  Skin: Negative.   Neurological: Positive for headaches. Negative for dizziness and weakness.   Physical Exam: Blood pressure 122/90, pulse 81, temperature (!) 100.5 F (38.1 C), temperature source Oral, resp. rate (!) 23, SpO2 98 %. Physical Exam Vitals signs and nursing note reviewed.  Constitutional:      General: He is not in acute distress.    Appearance: He is ill-appearing.  HENT:     Head: Normocephalic and atraumatic.     Mouth/Throat:     Mouth: Mucous membranes are dry.  Neck:     Musculoskeletal: Normal range of motion and neck supple.  Cardiovascular:     Rate and Rhythm: Normal rate and regular rhythm.     Heart sounds: Normal heart sounds. No murmur. No friction rub. No gallop.   Pulmonary:     Effort: Pulmonary effort is normal. Tachypnea present.  No respiratory distress.     Breath sounds: Decreased air movement present.     Comments: On 1L [place] Abdominal:     General: Bowel sounds are normal.     Palpations: Abdomen is soft.  Musculoskeletal:     Right lower leg: No edema.     Left lower leg: No edema.  Skin:    General: Skin is warm and dry.  Neurological:     General: No focal deficit present.     Mental Status: He is alert.  Psychiatric:        Mood and Affect: Mood normal.    EKG: personally reviewed my interpretation is sinus rhythm, borderline tachycardia  CXR: personally reviewed my interpretation is no cardiomegaly, without pleural effusions, pneumothorax, or osseous abnormalities, increased lung volumes, mild opacity in the R lower lung  Assessment & Plan by Problem: Active Problems:   COVID-19  Mr. Goldfeder is a 58 year old with significant PMH of tobacco use disorder, HTN, critical limb ischemia, rheumatoid arthritis, and chronic low back pain who presented with a 1 week history of worsening shortness of breath and loss of taste and smell, found to have a COVID-19 infection.   COVID-19 Infection Pt endorsing 6 days of subjective fevers, anosmia, ageusia, and worsening dyspnea. Exposure to roommate who tested positive for COVID-19 2 weeks ago and is currently hospitalized here at [facility]. Pt reports he tested negative after finding out about his roommate. However, he tested positive in the ED on 11/14. - supplemental oxygen placed on the pt by EMS for his comfort - oxygen saturations between 95-100 - currently on 1L [place] in the room with oxygen saturations >96 - bed transfer in to [facility] - will bolus with 1L NS for dehydration   Peripheral vascular disease s/p LLE bypass Underwent L common femoral to below the knee popliteal bypass with PTFE graft on 02/26/2019. Evaluated by vascular surgery in the ED for R leg pain and concern for chronic ischemia.  - brisk anterior tibial and peroneal signal on  the R, correlating with arteriogram  - does not appear ischemic, no tissue loss  - outpatient follow-up with Dr. Trula Slade - continue home aspirin 81 mg daily - continue home atorvastatin 80 mg daily   Hypokalemia K 2.9 on admission - replete with 1mEq PO potassium x 2  Hypertension BP stable at 100-120s/70-90s on admission. - continue home amlodipine 10mg  and chlorthalidone 25mg  daily  Chronic low back pain - continue home oxycodone-acetaminophen 5-325mg  PO q4h PRN - additional oxycodone 5mg  PO q4h PRN for severe pain  Rheumatoid arthritis - continue home prednisone 5mg  daily  Tobacco use disorder - nicotine patch 21mg  transdermal patch daily   Diet: low Na diet Fluids: 1L NS bolus VTE ppx: enoxaparin 40mg  subQ daily CODE STATUS: FULL CODE   Dispo: Admit patient to Inpatient with expected length of stay greater than 2 midnights.  Signed: Ladona Horns, MD 05/10/2019, 6:32 PM  Pager: [phone] Internal Medicine Teaching Service

## 2019-05-10 NOTE — ED Notes (Signed)
Vascular surgeon at bedside.

## 2019-05-10 NOTE — Consult Note (Signed)
Hospital Consult    Reason for Consult: Right leg pain, concern for underlying chronic ischemia Referring Physician: ED MRN #:  [id number]  History of Present Illness: This is a 58 y.o. male with history of hypertension, coronary artery disease, tobacco abuse, peripheral arterial disease status post left lower extremity bypass that vascular surgery was consulted to evaluate right leg.  He actually came to the ED today due to cough, loss of taste, and fatigue with chills.  He is being tested for Covid and his roommate was positive about 2 to 3 weeks ago.  Patient is well-known to the vascular surgery service.  He was previously seen in consultation by Dr. Trula Slade on 02/19/2019 when he had ischemic left great toe with tissue loss.  Ultimately underwent arteriogram by myself and subsequently a left common femoral to below-knee popliteal bypass with PTFE on 02/26/2019 by Dr. Trula Slade.  During that arteriogram his right lower extremity was also evaluated.  He had severe right lower extremity tibial disease and had single-vessel runoff via the anterior tibial that appeared to occlude above the ankle with collateral filling the peroneal distally.  Patient states most of the pain in his right leg is around the knee and has been there for several weeks.  The foot itself does not hurt.  He has no motor or sensory deficit.  He feels his left foot is doing much better since his bypass.  He has healed his ulcer on the left great toe.  Past Medical History:  Diagnosis Date  . Anemia   . Anemia 08/06/2012  . 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 Community acquired pneumonia 11/07/2011  . DDD (degenerative disc disease), lumbosacral     and grade 2 slip  . GERD (gastroesophageal reflux disease)   . Heart murmur   . History of anemia    no current med.  . Hypertension    states is borderline on medication; has been on med. x 5-6 yr.  . Hypokalemia 05/30/2018  . Impingement syndrome of shoulder region 10/2014   left  .  Insomnia 06/01/2015  . Left shoulder pain 12/16/2015  . Low back pain without sciatica 10/29/2009  . Lupus ([place])   . Peripheral vascular disease ([place])   . Pneumonia   . RA (rheumatoid arthritis) ([place])   . Rotator cuff tear 11/04/2014    Past Surgical History:  Procedure Laterality Date  . ABDOMINAL AORTAGRAM  02/20/2019  . ABDOMINAL AORTOGRAM W/LOWER EXTREMITY N/A 02/20/2019   Procedure: ABDOMINAL AORTOGRAM W/LOWER EXTREMITY;  Surgeon: Marty Heck, MD;  Location: [facility];  Service: Cardiovascular;  Laterality: N/A;  . ACHILLES TENDON SURGERY Bilateral   . FEMORAL-POPLITEAL BYPASS GRAFT Left 02/26/2019   Procedure: BYPASS GRAFT FEMORAL-POPLITEAL ARTERY LEFT LEG USING 58mm PROPATEN GRAFT;  Surgeon: Serafina Mitchell, MD;  Location: [facility];  Service: Vascular;  Laterality: Left;  . LEFT HEART CATH AND CORONARY ANGIOGRAPHY N/A 03/15/2017   Procedure: LEFT HEART CATH AND CORONARY ANGIOGRAPHY;  Surgeon: Nelva Bush, MD;  Location: [facility];  Service: Cardiovascular;  Laterality: N/A;  . OSTEOTOMY AND ULNAR SHORTENING Right 07/22/2002  . SHOULDER ARTHROSCOPY WITH BICEPSTENOTOMY Right 03/11/2015   Procedure: SHOULDER ARTHROSCOPY WITH BICEPSTENOTOMY;  Surgeon: Ninetta Lights, MD;  Location: [facility];  Service: Orthopedics;  Laterality: Right;  . SHOULDER ARTHROSCOPY WITH DISTAL CLAVICLE RESECTION Left 11/19/2014   Procedure: SHOULDER ARTHROSCOPY WITH DISTAL CLAVICLE RESECTION;  Surgeon: Kathryne Hitch, MD;  Location: [facility];  Service:  Orthopedics;  Laterality: Left;  . SHOULDER ARTHROSCOPY WITH ROTATOR CUFF REPAIR AND SUBACROMIAL DECOMPRESSION Left 11/19/2014   Procedure: LEFT SHOULDER ARTHROSCOPY, DEBRIDEMENT DISTAL CLAVICLE EXCISION, ACROMIOPLASTY WITH ROTATOR CUFF REPAIR ;  Surgeon: Kathryne Hitch, MD;  Location: [facility];  Service: Orthopedics;  Laterality: Left;  . TRANSFORAMINAL LUMBAR INTERBODY FUSION (TLIF) WITH PEDICLE  SCREW FIXATION 1 LEVEL N/A 01/03/2018   Procedure: TRANSFORAMINAL LUMBAR INTERBODY FUSION (TLIF) LUMBAR FIVE-SACRAL ONE;  Surgeon: Melina Schools, MD;  Location: [facility];  Service: Orthopedics;  Laterality: N/A;  . VIDEO ASSISTED THORACOSCOPY (VATS)/DECORTICATION Right 11/21/2011   drainage of empyema    Allergies  Allergen Reactions  . Morphine And Related Rash  . Ramipril Rash    Per patient on R arm and leg only; no angioedema  . Tramadol Itching and Rash    Prior to Admission medications   Medication Sig Start Date End Date Taking? Authorizing Provider  amLODipine (NORVASC) 10 MG tablet Take 1 tablet (10 mg total) by mouth daily. 03/15/19  Yes Agyei, Caprice Kluver, MD  chlorthalidone (HYGROTON) 25 MG tablet TAKE 1 TABLET BY MOUTH EVERY DAY Patient taking differently: Take 25 mg by mouth daily.  12/10/18  Yes Jean Rosenthal, MD  clopidogrel (PLAVIX) 75 MG tablet Take 1 tablet (75 mg total) by mouth daily. 02/28/19 02/28/20 Yes Earlene Plater, MD  docusate sodium (COLACE) 100 MG capsule Take 1 capsule (100 mg total) by mouth daily. Patient taking differently: Take 100 mg by mouth daily as needed for mild constipation.  02/27/19  Yes Santos-Sanchez, Merlene Morse, MD  Glycerin-Polysorbate 80 (REFRESH DRY EYE THERAPY OP) Place 1 drop into both eyes 3 (three) times daily as needed (dry/irritated eyes.).   Yes [provider]  naloxone [facility]) 2 MG/2ML injection Inject 2 mg into the muscle once as needed (opiod overdose (call 911, inject intramuscularly in shoulder or thigh. Repeat every 3 minutes)).    Yes [provider]  oxyCODONE-acetaminophen (PERCOCET) 10-325 MG tablet Take 1 tablet by mouth See admin instructions. Five times daily as needed for pain. 04/15/19  Yes [provider]  polyethylene glycol (MIRALAX / GLYCOLAX) 17 g packet Take 17 g by mouth daily as needed for mild constipation. 02/27/19  Yes Santos-Sanchez, Merlene Morse, MD  predniSONE (DELTASONE) 5 MG tablet Take 1 tablet (5  mg total) by mouth daily with breakfast. 03/19/19  Yes Agyei, Caprice Kluver, MD  atorvastatin (LIPITOR) 80 MG tablet Take 1 tablet (80 mg total) by mouth daily at 6 PM. Patient not taking: Reported on 05/10/2019 02/27/19   Welford Roche, MD  nicotine (NICODERM CQ - DOSED IN MG/24 HOURS) 21 mg/24hr patch Place 1 patch (21 mg total) onto the skin daily. Patient not taking: Reported on 05/10/2019 02/28/19   Welford Roche, MD  pantoprazole (PROTONIX) 40 MG tablet Take 1 tablet (40 mg total) by mouth daily. Patient not taking: Reported on 05/10/2019 02/28/19   Welford Roche, MD    Social History   Socioeconomic History  . Marital status: Single    Spouse name: Not on file  . Number of children: Not on file  . Years of education: Not on file  . Highest education level: Not on file  Occupational History  . Not on file  Social Needs  . Financial resource strain: Not on file  . Food insecurity    Worry: Not on file    Inability: Not on file  . Transportation needs    Medical: Not on file    Non-medical:  Not on file  Tobacco Use  . Smoking status: Former Smoker    Packs/day: 2.00    Years: 20.00    Pack years: 40.00    Types: Cigarettes    Quit date: 02/19/2019    Years since quitting: 0.2  . Smokeless tobacco: Never Used  . Tobacco comment: 2-3 packs per day  Substance and Sexual Activity  . Alcohol use: No    Alcohol/week: 0.0 standard drinks    Comment: sober 1998  . Drug use: No  . Sexual activity: Not on file  Lifestyle  . Physical activity    Days per week: Not on file    Minutes per session: Not on file  . Stress: Not on file  Relationships  . Social Herbalist on phone: Not on file    Gets together: Not on file    Attends religious service: Not on file    Active member of club or organization: Not on file    Attends meetings of clubs or organizations: Not on file    Relationship status: Not on file  . Intimate partner violence    Fear of  current or ex partner: Not on file    Emotionally abused: Not on file    Physically abused: Not on file    Forced sexual activity: Not on file  Other Topics Concern  . Not on file  Social History Narrative   Patient lives by himself, is on disability since 1992, used to work with city of [employer]. 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 Smokes 1/2 PPD and has been smoking since 25 years. Quit drinking in 1990.     Family History  Problem Relation Age of Onset  . Heart attack Father 39  . Stroke Neg Hx   . Cancer Neg Hx     ROS: [x]  Positive   [ ]  Negative   [ ]  All sytems reviewed and are negative  Cardiovascular: []  chest pain/pressure []  palpitations []  SOB lying flat [x]  DOE []  pain in legs while walking []  pain in legs at rest []  pain in legs at night []  non-healing ulcers []  hx of DVT []  swelling in legs  Pulmonary: [x]  productive cough []  asthma/wheezing []  home O2  Neurologic: []  weakness in []  arms []  legs []  numbness in []  arms []  legs []  hx of CVA []  mini stroke [] difficulty speaking or slurred speech []  temporary loss of vision in one eye []  dizziness  Hematologic: []  hx of cancer []  bleeding problems []  problems with blood clotting easily  Endocrine:   []  diabetes []  thyroid disease  GI []  vomiting blood []  blood in stool  GU: []  CKD/renal failure []  HD--[]  M/W/F or []  T/T/S []  burning with urination []  blood in urine  Psychiatric: []  anxiety []  depression  Musculoskeletal: []  arthritis []  joint pain  Integumentary: []  rashes []  ulcers  Constitutional: []  fever []  chills   Physical Examination  Vitals:   05/10/19 1230 05/10/19 1235  BP: 119/81   Pulse: 79   Resp: (!) 28   Temp:    SpO2: 96% 100%   There is no height or weight on file to calculate BMI.  General:  NAD Gait: Not observed HENT: WNL, normocephalic Pulmonary: no apparent respiratory distress Cardiac: regular, without  Murmurs, rubs or gallops Abdomen: soft,  NT/ND, no masses Skin: without rashes Vascular Exam/Pulses:  Right Left  Radial    Ulnar    Femoral 2+ (normal) 2+ (normal)  Popliteal 2+ (normal)  absent  AT Brisk signal Brisk signal  Peroneal signal    Extremities: without ischemic changes, without Gangrene , without cellulitis; without open wounds;  Musculoskeletal: no muscle wasting or atrophy  Neurologic: A&O X 3; Appropriate Affect ; SENSATION: normal; MOTOR FUNCTION:  moving all extremities equally. Speech is fluent/normal   CBC    Component Value Date/Time   WBC 4.9 05/10/2019 1159   RBC 5.44 05/10/2019 1159   HGB 16.0 05/10/2019 1235   HGB 13.9 03/13/2017 1032   HCT 47.0 05/10/2019 1235   HCT 41.6 03/13/2017 1032   PLT 170 05/10/2019 1159   PLT 204 03/13/2017 1032   MCV 83.3 05/10/2019 1159   MCV 84 03/13/2017 1032   MCH 26.1 05/10/2019 1159   MCHC 31.3 05/10/2019 1159   RDW 14.6 05/10/2019 1159   RDW 13.7 03/13/2017 1032   LYMPHSABS 0.8 05/10/2019 1159   LYMPHSABS 1.8 03/13/2017 1032   MONOABS 0.3 05/10/2019 1159   EOSABS 0.0 05/10/2019 1159   EOSABS 0.2 03/13/2017 1032   BASOSABS 0.0 05/10/2019 1159   BASOSABS 0.0 03/13/2017 1032    BMET    Component Value Date/Time   NA 136 05/10/2019 1235   NA 140 05/30/2018 1413   K 2.9 (L) 05/10/2019 1235   CL 92 (L) 05/10/2019 1235   CO2 27 05/10/2019 1159   GLUCOSE 101 (H) 05/10/2019 1235   BUN 14 05/10/2019 1235   BUN 9 05/30/2018 1413   CREATININE 1.00 05/10/2019 1235   CREATININE 0.80 11/27/2012 0956   CALCIUM 8.8 (L) 05/10/2019 1159   GFRNONAA >60 05/10/2019 1159   GFRNONAA >89 11/27/2012 0956   GFRAA >60 05/10/2019 1159   GFRAA >89 11/27/2012 0956    COAGS: Lab Results  Component Value Date   INR 1.0 02/26/2019   INR 1.0 02/19/2019   INR 1.0 03/13/2017     Non-Invasive Vascular Imaging:    Reviewed his arteriogram from 02/20/2019 and the right lower extremity is side of interest.  The SFA as well as the above and below-knee popliteal  arteries widely patent.  He has single-vessel runoff via the anterior tibial artery occludes above the ankle with runoff to the peroneal through a collateral.   ASSESSMENT/PLAN: This is a 58 y.o. male that presented to the ED with cough, fatigue, chills loss of taste is currently being ruled out for Covid 19.  Vascular surgery was asked to evaluate his right lower extremity with concern for chronic ischemia.  He recently had a bypass of the left lower extremity with Dr. Trula Slade.  As discussed with Dr. Tamera Punt, he had single-vessel runoff based on his most recent arteriogram earlier this year and severe tibial disease.  Only AT patent.  He has a very brisk anterior tibial signal on the right with the peroneal signal as well.  This correlates with his arteriogram from September.  His right foot is motor and sensory intact.  It does not appear overtly ischemic.  No active tissue loss.  Pain around the knee mostly.  Do not think he needs any urgent intervention at this time and I think his Covid work-up is more pressing.  Likely can arrange follow-up with Dr. Trula Slade as an outpatient.  Marty Heck, MD [facility] Office: [phone] Pager: [phone]

## 2019-05-10 NOTE — ED Notes (Addendum)
This RN ambulated pt 10 ft in his room. HR increased to 120 and RR increased to 30, SpO2 remained above 96%.  Pt reports increased dyspnea, requesting supplemental O2.  MD made aware, order received to put pt on 2L [place].

## 2019-05-10 NOTE — ED Provider Notes (Addendum)
[facility] EMERGENCY DEPARTMENT Provider Note   CSN: [id number] Arrival date & time: 05/10/19  1008     History   Chief Complaint Chief Complaint  Patient presents with  . Shortness of Breath    HPI [name] [name] is a 58 y.o. male.     Patient is a 58 year old male who presents with cough and cold symptoms.  He states about 2 to 3 weeks ago his roommate tested positive for Covid and he is currently in the hospital.  He did have a Covid test that was negative about 2 weeks ago.  He started complaining of chills and subjective fevers about 5 to 6 days ago with body aches and a started having shortness of breath about 4 to 5 days ago.  He has a nonproductive cough.  He has had some nausea but no vomiting.  He does not have a good appetite.  He is a smoker but he has no oxygen requirement at home on a normal basis.  He does report some intermittent chest pains but none currently.  He also had a femoropopliteal bypass on September 2.  He has been complaining of some pain across his lower pelvis starting in his left groin where he had the recent surgery.  He also has pain that goes down his right leg.  He describes it as a crampy pain that starts in the groin and goes all the way down the leg.  He feels like the leg is a little bit weaker than normal.  He denies any known swelling of the legs.     Past Medical History:  Diagnosis Date  . Anemia   . Anemia 08/06/2012  . 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 Community acquired pneumonia 11/07/2011  . DDD (degenerative disc disease), lumbosacral     and grade 2 slip  . GERD (gastroesophageal reflux disease)   . Heart murmur   . History of anemia    no current med.  . Hypertension    states is borderline on medication; has been on med. x 5-6 yr.  . Hypokalemia 05/30/2018  . Impingement syndrome of shoulder region 10/2014   left  . Insomnia 06/01/2015  . Left shoulder pain 12/16/2015  . Low back pain without sciatica 10/29/2009   . Lupus ([place])   . Peripheral vascular disease ([place])   . Pneumonia   . RA (rheumatoid arthritis) ([place])   . Rotator cuff tear 11/04/2014    Patient Active Problem List   Diagnosis Date Noted  . PAD (peripheral artery disease) ([place])   . Ischemic ulcer of toe of left foot ([place])   . Critical lower limb ischemia 02/19/2019  . S/P lumbar fusion 01/03/2018  . Coronary artery disease involving native coronary artery of native heart with unstable angina pectoris ([place])   . Preventative health care 03/13/2017  . Transaminitis 12/16/2015  . Bilateral shoulder pain 08/20/2012  . Tobacco use disorder 08/20/2012  . HTN (hypertension) 04/20/2011  . HLD (hyperlipidemia) 10/08/2009  . Rheumatoid arthritis ([place]) 08/11/2009    Past Surgical History:  Procedure Laterality Date  . ABDOMINAL AORTAGRAM  02/20/2019  . ABDOMINAL AORTOGRAM W/LOWER EXTREMITY N/A 02/20/2019   Procedure: ABDOMINAL AORTOGRAM W/LOWER EXTREMITY;  Surgeon: Marty Heck, MD;  Location: [facility];  Service: Cardiovascular;  Laterality: N/A;  . ACHILLES TENDON SURGERY Bilateral   . FEMORAL-POPLITEAL BYPASS GRAFT Left 02/26/2019   Procedure: BYPASS GRAFT FEMORAL-POPLITEAL ARTERY LEFT LEG USING 106mm PROPATEN GRAFT;  Surgeon: Trula Slade,  Butch Penny, MD;  Location: [facility];  Service: Vascular;  Laterality: Left;  . LEFT HEART CATH AND CORONARY ANGIOGRAPHY N/A 03/15/2017   Procedure: LEFT HEART CATH AND CORONARY ANGIOGRAPHY;  Surgeon: Nelva Bush, MD;  Location: [facility];  Service: Cardiovascular;  Laterality: N/A;  . OSTEOTOMY AND ULNAR SHORTENING Right 07/22/2002  . SHOULDER ARTHROSCOPY WITH BICEPSTENOTOMY Right 03/11/2015   Procedure: SHOULDER ARTHROSCOPY WITH BICEPSTENOTOMY;  Surgeon: Ninetta Lights, MD;  Location: [facility];  Service: Orthopedics;  Laterality: Right;  . SHOULDER ARTHROSCOPY WITH DISTAL CLAVICLE RESECTION Left 11/19/2014   Procedure: SHOULDER ARTHROSCOPY WITH DISTAL CLAVICLE  RESECTION;  Surgeon: Kathryne Hitch, MD;  Location: [facility];  Service: Orthopedics;  Laterality: Left;  . SHOULDER ARTHROSCOPY WITH ROTATOR CUFF REPAIR AND SUBACROMIAL DECOMPRESSION Left 11/19/2014   Procedure: LEFT SHOULDER ARTHROSCOPY, DEBRIDEMENT DISTAL CLAVICLE EXCISION, ACROMIOPLASTY WITH ROTATOR CUFF REPAIR ;  Surgeon: Kathryne Hitch, MD;  Location: [facility];  Service: Orthopedics;  Laterality: Left;  . TRANSFORAMINAL LUMBAR INTERBODY FUSION (TLIF) WITH PEDICLE SCREW FIXATION 1 LEVEL N/A 01/03/2018   Procedure: TRANSFORAMINAL LUMBAR INTERBODY FUSION (TLIF) LUMBAR FIVE-SACRAL ONE;  Surgeon: Melina Schools, MD;  Location: [facility];  Service: Orthopedics;  Laterality: N/A;  . VIDEO ASSISTED THORACOSCOPY (VATS)/DECORTICATION Right 11/21/2011   drainage of empyema        Home Medications    Prior to Admission medications   Medication Sig Start Date End Date Taking? Authorizing Provider  amLODipine (NORVASC) 10 MG tablet Take 1 tablet (10 mg total) by mouth daily. 03/15/19  Yes Agyei, Caprice Kluver, MD  chlorthalidone (HYGROTON) 25 MG tablet TAKE 1 TABLET BY MOUTH EVERY DAY Patient taking differently: Take 25 mg by mouth daily.  12/10/18  Yes Jean Rosenthal, MD  clopidogrel (PLAVIX) 75 MG tablet Take 1 tablet (75 mg total) by mouth daily. 02/28/19 02/28/20 Yes Earlene Plater, MD  docusate sodium (COLACE) 100 MG capsule Take 1 capsule (100 mg total) by mouth daily. Patient taking differently: Take 100 mg by mouth daily as needed for mild constipation.  02/27/19  Yes Santos-Sanchez, Merlene Morse, MD  Glycerin-Polysorbate 80 (REFRESH DRY EYE THERAPY OP) Place 1 drop into both eyes 3 (three) times daily as needed (dry/irritated eyes.).   Yes [provider]  naloxone [facility]) 2 MG/2ML injection Inject 2 mg into the muscle once as needed (opiod overdose (call 911, inject intramuscularly in shoulder or thigh. Repeat every 3 minutes)).    Yes [provider]   oxyCODONE-acetaminophen (PERCOCET) 10-325 MG tablet Take 1 tablet by mouth See admin instructions. Five times daily as needed for pain. 04/15/19  Yes [provider]  polyethylene glycol (MIRALAX / GLYCOLAX) 17 g packet Take 17 g by mouth daily as needed for mild constipation. 02/27/19  Yes Santos-Sanchez, Merlene Morse, MD  predniSONE (DELTASONE) 5 MG tablet Take 1 tablet (5 mg total) by mouth daily with breakfast. 03/19/19  Yes Agyei, Caprice Kluver, MD  atorvastatin (LIPITOR) 80 MG tablet Take 1 tablet (80 mg total) by mouth daily at 6 PM. Patient not taking: Reported on 05/10/2019 02/27/19   Welford Roche, MD  nicotine (NICODERM CQ - DOSED IN MG/24 HOURS) 21 mg/24hr patch Place 1 patch (21 mg total) onto the skin daily. Patient not taking: Reported on 05/10/2019 02/28/19   Welford Roche, MD  pantoprazole (PROTONIX) 40 MG tablet Take 1 tablet (40 mg total) by mouth daily. Patient not taking: Reported on 05/10/2019 02/28/19   Welford Roche, MD  Family History Family History  Problem Relation Age of Onset  . Heart attack Father 32  . Stroke Neg Hx   . Cancer Neg Hx     Social History Social History   Tobacco Use  . Smoking status: Former Smoker    Packs/day: 2.00    Years: 20.00    Pack years: 40.00    Types: Cigarettes    Quit date: 02/19/2019    Years since quitting: 0.2  . Smokeless tobacco: Never Used  . Tobacco comment: 2-3 packs per day  Substance Use Topics  . Alcohol use: No    Alcohol/week: 0.0 standard drinks    Comment: sober 1998  . Drug use: No     Allergies   Morphine and related, Ramipril, and Tramadol   Review of Systems Review of Systems  Constitutional: Positive for appetite change, chills, fatigue and fever (Subjective). Negative for diaphoresis.  HENT: Positive for congestion and rhinorrhea. Negative for sneezing.   Eyes: Negative.   Respiratory: Positive for cough and shortness of breath. Negative for chest tightness.    Cardiovascular: Positive for chest pain. Negative for leg swelling.  Gastrointestinal: Positive for nausea. Negative for abdominal pain, blood in stool, diarrhea and vomiting.  Genitourinary: Negative for difficulty urinating, flank pain, frequency and hematuria.  Musculoskeletal: Positive for myalgias. Negative for arthralgias and back pain.  Skin: Negative for rash.  Neurological: Positive for weakness and headaches. Negative for dizziness, speech difficulty and numbness.     Physical Exam Updated Vital Signs BP 101/77   Pulse (!) 106   Temp (!) 100.5 F (38.1 C) (Oral)   Resp (!) 21   SpO2 98%   Physical Exam Constitutional:      Appearance: He is well-developed.  HENT:     Head: Normocephalic and atraumatic.  Eyes:     Pupils: Pupils are equal, round, and reactive to light.  Neck:     Musculoskeletal: Normal range of motion and neck supple.  Cardiovascular:     Rate and Rhythm: Normal rate and regular rhythm.     Heart sounds: Normal heart sounds.  Pulmonary:     Effort: Pulmonary effort is normal. No respiratory distress.     Breath sounds: Normal breath sounds. No wheezing or rales.  Chest:     Chest wall: No tenderness.  Abdominal:     General: Bowel sounds are normal.     Palpations: Abdomen is soft.     Tenderness: There is no abdominal tenderness. There is no guarding or rebound.  Musculoskeletal: Normal range of motion.     Comments: Patient's feet are both cool to the touch.  His right foot is slightly paler than the left.  I am unable to palpate pulses in either foot.  I am unable to elicit any dopplerable pulses in the right foot.  He does have a strong femoral pulse.  He has normal motor function in both legs.  Lymphadenopathy:     Cervical: No cervical adenopathy.  Skin:    General: Skin is warm and dry.     Findings: No rash.  Neurological:     Mental Status: He is alert and oriented to person, place, and time.      ED Treatments / Results  Labs  (all labs ordered are listed, but only abnormal results are displayed) Labs Reviewed  COMPREHENSIVE METABOLIC PANEL - Abnormal; Notable for the following components:      Result Value   Potassium 2.9 (*)    Chloride  92 (*)    Glucose, Bld 103 (*)    Calcium 8.8 (*)    Albumin 3.3 (*)    Anion gap 16 (*)    All other components within normal limits  D-DIMER, QUANTITATIVE (NOT AT [facility]) - Abnormal; Notable for the following components:   D-Dimer, Quant 2.25 (*)    All other components within normal limits  TRIGLYCERIDES - Abnormal; Notable for the following components:   Triglycerides 186 (*)    All other components within normal limits  FIBRINOGEN - Abnormal; Notable for the following components:   Fibrinogen 630 (*)    All other components within normal limits  C-REACTIVE PROTEIN - Abnormal; Notable for the following components:   CRP 4.4 (*)    All other components within normal limits  I-STAT CHEM 8, ED - Abnormal; Notable for the following components:   Potassium 2.9 (*)    Chloride 92 (*)    Glucose, Bld 101 (*)    Calcium, Ion 1.12 (*)    All other components within normal limits  CULTURE, BLOOD (ROUTINE X 2)  CULTURE, BLOOD (ROUTINE X 2)  SARS CORONAVIRUS 2 (TAT 6-24 HRS)  LACTIC ACID, PLASMA  CBC WITH DIFFERENTIAL/PLATELET  PROCALCITONIN  LACTATE DEHYDROGENASE  FERRITIN  LACTIC ACID, PLASMA  TROPONIN I (HIGH SENSITIVITY)  TROPONIN I (HIGH SENSITIVITY)    EKG EKG Interpretation  Date/Time:  Saturday May 10 2019 10:19:40 EST Ventricular Rate:  100 PR Interval:  144 QRS Duration: 96 QT Interval:  354 QTC Calculation: 456 R Axis:   122 Text Interpretation: Normal sinus rhythm Left posterior fascicular block Nonspecific ST abnormality Abnormal ECG similar to prior EKGs Confirmed by Malvin Johns [phone]) on 05/10/2019 11:06:28 AM   Radiology Dg Chest Portable 1 View  Result Date: 05/10/2019 CLINICAL DATA:  Shortness of breath, chills. EXAM: PORTABLE  CHEST 1 VIEW COMPARISON:  06/17/2018 FINDINGS: Stable top-normal heart size. Stable atherosclerosis and tortuosity of the thoracic aorta. Lungs demonstrate stable emphysematous disease. Mild opacity in the right lower lung may be related to some chronic scarring. Subtle pneumonia cannot be excluded. IMPRESSION: 1. Mild opacity in the right lower lung may be related to some chronic scarring. Subtle pneumonia cannot be excluded. 2. Stable emphysematous disease. Electronically Signed   By: Aletta Edouard M.D.   On: 05/10/2019 11:09    Procedures Procedures (including critical care time)  Medications Ordered in ED Medications  potassium chloride SA (KLOR-CON) CR tablet 40 mEq (has no administration in time range)  fentaNYL (SUBLIMAZE) injection 50 mcg (50 mcg Intravenous Given 05/10/19 1352)  acetaminophen (TYLENOL) tablet 650 mg (650 mg Oral Given 05/10/19 1511)     Initial Impression / Assessment and Plan / ED Course  I have reviewed the triage vital signs and the nursing notes.  Pertinent labs & imaging results that were available during my care of the patient were reviewed by me and considered in my medical decision making (see chart for details).        Patient is a 58 year old male who presents with respiratory symptoms in conjunction with a recent close exposure to Covid.  His chest x-ray shows a questionable infiltrate in the right lower lung.  He is mildly febrile.  He is mildly tachycardic.  He is maintaining oxygen saturation at about 93 to 94% on room air although he does feel short of breath and anytime he moves around he gets markedly tachypneic and tachycardic.  He was placed on nasal cannula 2 L due to this  reason.  His potassium is low and he was given some oral potassium replacement.  His other labs are nonconcerning.  He did have concern for lack of pulses in his right foot.  Dr. Carlis Abbott with vascular surgery has evaluated the patient and feels that this is more of a chronic  issue and he can follow-up in the office following treatment of his current illness.  I spoke with the resident on-call for internal medicine who will admit the patient for further treatment.  His Covid test is pending.  Final Clinical Impressions(s) / ED Diagnoses   Final diagnoses:  Close exposure to COVID-19 virus  SOB (shortness of breath)  PAD (peripheral artery disease) ([place])  Hypokalemia    ED Discharge Orders    None       Malvin Johns, MD 05/10/19 1549    Malvin Johns, MD 05/10/19 1550

## 2019-05-11 DIAGNOSIS — U071 COVID-19: Principal | ICD-10-CM

## 2019-05-11 LAB — BASIC METABOLIC PANEL
Anion gap: 12 (ref 5–15)
Anion gap: 10 (ref 5–15)
BUN: 11 mg/dL (ref 6–20)
BUN: 14 mg/dL (ref 6–20)
CO2: 25 mmol/L (ref 22–32)
CO2: 24 mmol/L (ref 22–32)
Calcium: 8.3 mg/dL — ABNORMAL LOW (ref 8.9–10.3)
Calcium: 8.6 mg/dL — ABNORMAL LOW (ref 8.9–10.3)
Chloride: 98 mmol/L (ref 98–111)
Chloride: 101 mmol/L (ref 98–111)
Creatinine, Ser: 0.98 mg/dL (ref 0.61–1.24)
Creatinine, Ser: 0.93 mg/dL (ref 0.61–1.24)
GFR calc Af Amer: >60 mL/min (ref >60)
GFR calc Af Amer: >60 mL/min (ref >60)
GFR calc non Af Amer: >60 mL/min (ref >60)
GFR calc non Af Amer: >60 mL/min (ref >60)
Glucose, Bld: 137 mg/dL — ABNORMAL HIGH (ref 70–99)
Glucose, Bld: 145 mg/dL — ABNORMAL HIGH (ref 70–99)
Potassium: 2.8 mmol/L — ABNORMAL LOW (ref 3.5–5.1)
Potassium: 4 mmol/L (ref 3.5–5.1)
Sodium: 135 mmol/L (ref 135–145)
Sodium: 135 mmol/L (ref 135–145)

## 2019-05-11 LAB — CBC
HCT: 39.6 % (ref 39.0–52.0)
Hemoglobin: 12.4 g/dL — ABNORMAL LOW (ref 13.0–17.0)
MCH: 26.2 pg (ref 26.0–34.0)
MCHC: 31.3 g/dL (ref 30.0–36.0)
MCV: 83.7 fL (ref 80.0–100.0)
Platelets: 150 10*3/uL (ref 150–400)
RBC: 4.73 MIL/uL (ref 4.22–5.81)
RDW: 14.6 % (ref 11.5–15.5)
WBC: 4.1 10*3/uL (ref 4.0–10.5)
nRBC: 0 % (ref 0.0–0.2)

## 2019-05-11 LAB — MAGNESIUM: Magnesium: 1.8 mg/dL (ref 1.7–2.4)

## 2019-05-11 MED ORDER — SODIUM CHLORIDE 0.9 % IV SOLN
100.0000 mg | INTRAVENOUS | Status: AC
  Administered 2019-05-12 – 2019-05-15 (×4): 100 mg via INTRAVENOUS
  Filled 2019-05-11 (×4): qty 100

## 2019-05-11 MED ORDER — METHYLPREDNISOLONE SODIUM SUCC 40 MG IJ SOLR
40.0000 mg | Freq: Three times a day (TID) | INTRAMUSCULAR | Status: DC
  Administered 2019-05-11 – 2019-05-13 (×6): 40 mg via INTRAVENOUS
  Filled 2019-05-11 (×6): qty 1

## 2019-05-11 MED ORDER — POLYETHYLENE GLYCOL 3350 17 G PO PACK: 17.0000 g | PACK | Freq: Every day | ORAL | Status: DC | PRN

## 2019-05-11 MED ORDER — POTASSIUM CHLORIDE CRYS ER 20 MEQ PO TBCR
40.0000 meq | EXTENDED_RELEASE_TABLET | Freq: Once | ORAL | Status: AC
  Administered 2019-05-11: 40 meq via ORAL
  Filled 2019-05-11: qty 2

## 2019-05-11 MED ORDER — POTASSIUM CHLORIDE 10 MEQ/100ML IV SOLN
10.0000 meq | INTRAVENOUS | Status: AC
  Administered 2019-05-11 (×5): 10 meq via INTRAVENOUS
  Filled 2019-05-11 (×5): qty 100

## 2019-05-11 MED ORDER — SODIUM CHLORIDE 0.9 % IV SOLN
200.0000 mg | Freq: Once | INTRAVENOUS | Status: AC
  Administered 2019-05-11: 200 mg via INTRAVENOUS
  Filled 2019-05-11: qty 40

## 2019-05-11 MED ORDER — IPRATROPIUM-ALBUTEROL 20-100 MCG/ACT IN AERS
1.0000 | INHALATION_SPRAY | Freq: Four times a day (QID) | RESPIRATORY_TRACT | Status: DC
  Administered 2019-05-11 – 2019-05-15 (×17): 1 via RESPIRATORY_TRACT
  Filled 2019-05-11: qty 4

## 2019-05-11 NOTE — ED Notes (Signed)
Breakfast Ordered 

## 2019-05-11 NOTE — Plan of Care (Signed)
  Problem: Clinical Measurements: Goal: Respiratory complications will improve Outcome: Progressing   

## 2019-05-11 NOTE — Progress Notes (Signed)
59 year old male that was seen in consultation yesterday with concern for chronic ischemia of the right lower extremity.  COVID-19 test is positive and he is being transported to [facility].  As noted in consult note yesterday motor and sensory intact.  He has a very brisk AT signal which is his only runoff based on his recent arteriogram.  No tissue loss.  No pain in the foot.  Pain is in the knee.  Do not feel any urgent indication for intervention.  He can follow-up as an outpatient which we will arrange.  Vascular will sign off.  Call if there is a concern or change in his exam.  Marty Heck, MD [facility] Office: [phone] Pager: [place]

## 2019-05-11 NOTE — ED Notes (Signed)
COVID+ MS

## 2019-05-11 NOTE — Progress Notes (Signed)
PROGRESS NOTE    [name] [name]  [id number] DOB: [birth year] DOA: 05/10/2019 PCP: Jean Rosenthal, MD   Brief Narrative:  58 year old with history of HTN, CAD, HLD, PVD status post left-sided femoropopliteal bypass 02/2019, rheumatoid arthritis presented with URI type symptoms with started 6 days prior to hospital admission.  Reported of some right lower extremity pain but was seen by vascular who recommended outpatient follow-up.  Patient transferred to [facility] for further management.   Assessment & Plan:   Principal Problem:   COVID-19 Active Problems:   HLD (hyperlipidemia)   Rheumatoid arthritis (H[facility])   HTN (hypertension)   PAD (peripheral artery disease) ([facility])   Ischemic ulcer of toe of left foot ([facility])   Acute respiratory distress secondary to COVID-19 pneumonia -Oxygen levels-room air -Remdesivir-day 1 -Solu-Medrol 40 mg every 8 hours -Routine: Labs have been reviewed including ferritin, LDH, CRP, d-dimer, fibrinogen.  Will need to trend this lab daily. -Vitamin C & Zinc. Prone >16hrs/day.  -Check BNP, procalcitonin -Chest x-ray-my right lower lobe opacity -Supportive care-antitussive, inhalers, I-S/flutter -CODE STATUS confirmed  Hypokalemia -Aggressive repletion  Peripheral arterial disease Status post left femoropopliteal bypass 02/2019 -Continue home regimen Plavix and statin. -Seen by vascular surgery, recommend outpatient follow-up with Dr. Trula Slade  Coronary artery disease -Plavix, statin.  Essential hypertension -Norvasc 10 mg daily.  Chlorthalidone 25 mg daily.  Chronic low back pain -Pain medications with bowel regimen  Rheumatoid arthritis -Home regimen prednisone 5 mg daily currently on hold as patient is on IV Solu-Medrol  GERD -PPI  DVT prophylaxis: Lovenox Code Status: Full code Family Communication: None at bedside Disposition Plan: Maintain hospital stay for IV remdesivir treatment for COVID-19  Consultants:   Vascular    Subjective: Seen and examined at bedside, does not any complaints at rest at this time.  Review of Systems Otherwise negative except as per HPI, including: General: Denies fever, chills, night sweats or unintended weight loss. Resp: Denies cough, wheezing, shortness of breath. Cardiac: Denies chest pain, palpitations, orthopnea, paroxysmal nocturnal dyspnea. GI: Denies abdominal pain, nausea, vomiting, diarrhea or constipation GU: Denies dysuria, frequency, hesitancy or incontinence MS: Denies muscle aches, joint pain or swelling Neuro: Denies headache, neurologic deficits (focal weakness, numbness, tingling), abnormal gait Psych: Denies anxiety, depression, SI/HI/AVH Skin: Denies new rashes or lesions ID: Denies sick contacts, exotic exposures, travel  Objective: Vitals:   05/11/19 0830 05/11/19 0845 05/11/19 0900 05/11/19 0915  BP:      Pulse: 79 81 80 78  Resp: 20 18 20 16   Temp:      TempSrc:      SpO2: 99% 95% 96% 94%    Intake/Output Summary (Last 24 hours) at 05/11/2019 1200 Last data filed at 05/10/2019 2259 Gross per 24 hour  Intake 1000 ml  Output -  Net 1000 ml   There were no vitals filed for this visit.  Examination:  General exam: Appears calm and comfortable  Respiratory system: Minimal bibasilar rhonchi Cardiovascular system: S1 & S2 heard, RRR. No JVD, murmurs, rubs, gallops or clicks. No pedal edema. Gastrointestinal system: Abdomen is nondistended, soft and nontender. No organomegaly or masses felt. Normal bowel sounds heard. Central nervous system: Alert and oriented. No focal neurological deficits. Extremities: Symmetric 5 x 5 power. Skin: No rashes, lesions or ulcers Psychiatry: Judgement and insight appear normal. Mood & affect appropriate.    Data Reviewed:   CBC: Recent Labs  Lab 05/10/19 1159 05/10/19 1235 05/11/19 0422  WBC 4.9  --  4.1  NEUTROABS  3.8  --   --   HGB 14.2 16.0 12.4*  HCT 45.3 47.0 39.6  MCV 83.3  --  83.7  PLT  170  --  [id number]   Basic Metabolic Panel: Recent Labs  Lab 05/10/19 1159 05/10/19 1235 05/11/19 0422  NA 135 136 135  K 2.9* 2.9* 2.8*  CL 92* 92* 98  CO2 27  --  25  GLUCOSE 103* 101* 137*  BUN 12 14 11   CREATININE 0.95 1.00 0.98  CALCIUM 8.8*  --  8.3*  MG  --   --  1.8   GFR: CrCl cannot be calculated (Unknown ideal weight.). Liver Function Tests: Recent Labs  Lab 05/10/19 1159  AST 27  ALT 30  ALKPHOS 87  BILITOT 0.4  PROT 7.7  ALBUMIN 3.3*   No results for input(s): LIPASE, AMYLASE in the last 168 hours. No results for input(s): AMMONIA in the last 168 hours. Coagulation Profile: No results for input(s): INR, PROTIME in the last 168 hours. Cardiac Enzymes: No results for input(s): CKTOTAL, CKMB, CKMBINDEX, TROPONINI in the last 168 hours. BNP (last 3 results) No results for input(s): PROBNP in the last 8760 hours. HbA1C: No results for input(s): HGBA1C in the last 72 hours. CBG: No results for input(s): GLUCAP in the last 168 hours. Lipid Profile: Recent Labs    05/10/19 1159  TRIG 186*   Thyroid Function Tests: No results for input(s): TSH, T4TOTAL, FREET4, T3FREE, THYROIDAB in the last 72 hours. Anemia Panel: Recent Labs    05/10/19 1159  FERRITIN 129   Sepsis Labs: Recent Labs  Lab 05/10/19 1157 05/10/19 1159 05/10/19 1910  PROCALCITON  --  0.10  --   LATICACIDVEN 1.5  --  1.1    Recent Results (from the past 240 hour(s))  SARS CORONAVIRUS 2 (TAT 6-24 HRS) Nasopharyngeal Nasopharyngeal Swab     Status: Abnormal   Collection Time: 05/10/19 11:57 AM   Specimen: Nasopharyngeal Swab  Result Value Ref Range Status   SARS Coronavirus 2 POSITIVE (A) NEGATIVE Final    Comment: RESULT CALLED TO, READ BACK BY AND VERIFIED WITH: RYLAND KENNEDY RN.@[email or web address]  ON 11.14.2020 BY TCALDWELL MT. (NOTE) SARS-CoV-2 target nucleic acids are DETECTED. The SARS-CoV-2 RNA is generally detectable in upper and lower respiratory specimens during the acute phase of  infection. Positive results are indicative of active infection with SARS-CoV-2. Clinical  correlation with patient history and other diagnostic information is necessary to determine patient infection status. Positive results do  not rule out bacterial infection or co-infection with other viruses. The expected result is Negative. Fact Sheet for Patients: SugarRoll.be Fact Sheet for Healthcare Providers: https://www.woods-mathews.com/ This test is not yet approved or cleared by the [place] FDA and  has been authorized for detection and/or diagnosis of SARS-CoV-2 by FDA under an Emergency Use Authorization (EUA). This EUA will remain  in effect (meaning this test  can be used) for the duration of the COVID-19 declaration under Section 564(b)(1) of the Act, 21 U.S.C. section 360bbb-3(b)(1), unless the authorization is terminated or revoked sooner. Performed at [facility], [place] [address] [place], [place] [address]   Blood Culture (routine x 2)     Status: None (Preliminary result)   Collection Time: 05/10/19 12:49 PM   Specimen: BLOOD  Result Value Ref Range Status   Specimen Description BLOOD SITE NOT SPECIFIED  Final   Special Requests   Final    BOTTLES DRAWN AEROBIC AND ANAEROBIC Blood Culture adequate volume  Culture   Final    NO GROWTH < 12 HOURS Performed at [facility], [place] [address] [place], [place] [address]    Report Status PENDING  Incomplete  Blood Culture (routine x 2)     Status: None (Preliminary result)   Collection Time: 05/10/19 12:50 PM   Specimen: BLOOD  Result Value Ref Range Status   Specimen Description BLOOD RIGHT ANTECUBITAL  Final   Special Requests   Final    BOTTLES DRAWN AEROBIC AND ANAEROBIC Blood Culture results may not be optimal due to an excessive volume of blood received in culture bottles   Culture   Final    NO GROWTH < 12 HOURS Performed at [facility], [place] [address], [place], [place] [address]    Report Status PENDING  Incomplete         Radiology Studies: Dg Chest Portable 1 View  Result Date: 05/10/2019 CLINICAL DATA:  Shortness of breath, chills. EXAM: PORTABLE CHEST 1 VIEW COMPARISON:  06/17/2018 FINDINGS: Stable top-normal heart size. Stable atherosclerosis and tortuosity of the thoracic aorta. Lungs demonstrate stable emphysematous disease. Mild opacity in the right lower lung may be related to some chronic scarring. Subtle pneumonia cannot be excluded. IMPRESSION: 1. Mild opacity in the right lower lung may be related to some chronic scarring. Subtle pneumonia cannot be excluded. 2. Stable emphysematous disease. Electronically Signed   By: Aletta Edouard M.D.   On: 05/10/2019 11:09        Scheduled Meds: . amLODipine  10 mg Oral Daily  . atorvastatin  80 mg Oral q1800  . chlorthalidone  25 mg Oral Daily  . clopidogrel  75 mg Oral Daily  . docusate sodium  100 mg Oral Daily  . enoxaparin (LOVENOX) injection  40 mg Subcutaneous Q24H  . Ipratropium-Albuterol  1 puff Inhalation Q6H  . methylPREDNISolone (SOLU-MEDROL) injection  40 mg Intravenous Q8H  . nicotine  21 mg Transdermal Daily   Continuous Infusions: . remdesivir 200 mg in NS 250 mL 200 mg (05/11/19 1132)   Followed by  . [START ON 05/12/2019] remdesivir 100 mg in NS 250 mL       LOS: 1 day   Time spent= 35 mins    Senay Sistrunk Arsenio Loader, MD [facility]  If 7PM-7AM, please contact night-coverage  05/11/2019, 12:00 PM

## 2019-05-11 NOTE — ED Notes (Signed)
[facility] has arrived to transport pt to [facility]

## 2019-05-12 LAB — BASIC METABOLIC PANEL
Anion gap: 11 (ref 5–15)
BUN: 15 mg/dL (ref 6–20)
CO2: 25 mmol/L (ref 22–32)
Calcium: 9 mg/dL (ref 8.9–10.3)
Chloride: 99 mmol/L (ref 98–111)
Creatinine, Ser: 0.81 mg/dL (ref 0.61–1.24)
GFR calc Af Amer: >60 mL/min (ref >60)
GFR calc non Af Amer: >60 mL/min (ref >60)
Glucose, Bld: 155 mg/dL — ABNORMAL HIGH (ref 70–99)
Potassium: 3.8 mmol/L (ref 3.5–5.1)
Sodium: 135 mmol/L (ref 135–145)

## 2019-05-12 LAB — C-REACTIVE PROTEIN: CRP: 6.6 mg/dL — ABNORMAL HIGH (ref <1.0)

## 2019-05-12 LAB — LACTATE DEHYDROGENASE: LDH: 149 U/L (ref 98–192)

## 2019-05-12 LAB — CBC
HCT: 39.7 % (ref 39.0–52.0)
Hemoglobin: 12.7 g/dL — ABNORMAL LOW (ref 13.0–17.0)
MCH: 26.4 pg (ref 26.0–34.0)
MCHC: 32 g/dL (ref 30.0–36.0)
MCV: 82.5 fL (ref 80.0–100.0)
Platelets: 127 10*3/uL — ABNORMAL LOW (ref 150–400)
RBC: 4.81 MIL/uL (ref 4.22–5.81)
RDW: 14.1 % (ref 11.5–15.5)
WBC: 3.2 10*3/uL — ABNORMAL LOW (ref 4.0–10.5)
nRBC: 0 % (ref 0.0–0.2)

## 2019-05-12 LAB — FERRITIN: Ferritin: 132 ng/mL (ref 24–336)

## 2019-05-12 LAB — MAGNESIUM: Magnesium: 1.7 mg/dL (ref 1.7–2.4)

## 2019-05-12 LAB — D-DIMER, QUANTITATIVE: D-Dimer, Quant: 2.46 ug/mL-FEU — ABNORMAL HIGH (ref 0.00–0.50)

## 2019-05-12 NOTE — Progress Notes (Signed)
PROGRESS NOTE    [name] [name]  [id number] DOB: 26-Jun-1961 DOA: 05/10/2019 PCP: Jean Rosenthal, MD   Brief Narrative:  58 year old with history of HTN, CAD, HLD, PVD status post left-sided femoropopliteal bypass 02/2019, rheumatoid arthritis presented with URI type symptoms with started 6 days prior to hospital admission.  Reported of some right lower extremity pain but was seen by vascular who recommended outpatient follow-up.  Patient transferred to [facility] for further management.   Assessment & Plan:   Principal Problem:   COVID-19 Active Problems:   HLD (hyperlipidemia)   Rheumatoid arthritis (HCC)   HTN (hypertension)   PAD (peripheral artery disease) (HCC)   Ischemic ulcer of toe of left foot (HCC)   Acute respiratory distress secondary to COVID-19 pneumonia -Oxygen levels-room air -Remdesivir-day 2 -continue Solu-Medrol 40 mg every 8 -CRP slightly trending up, other markers look okay -Vitamin C & Zinc. Prone >16hrs/day.  -Procalcitonin negative -Chest x-ray-my right lower lobe opacity -Supportive care-antitussive, inhalers, I-S/flutter -CODE STATUS confirmed  Hypokalemia, resolved -Resolved  Peripheral arterial disease Status post left femoropopliteal bypass 02/2019 -Continue home regimen Plavix and statin. -Seen by vascular surgery, recommend outpatient follow-up with Dr. Trula Slade  Coronary artery disease -Plavix, statin.  Essential hypertension -Norvasc 10 mg daily.  Chlorthalidone 25 mg daily.  Chronic low back pain -Pain medications with bowel regimen  Rheumatoid arthritis -Home regimen prednisone 5 mg daily currently on hold as patient is on IV Solu-Medrol  GERD -PPI  DVT prophylaxis: Lovenox Code Status: Full code Family Communication: None at bedside Disposition Plan: Maintain hospital stay to complete course of IV remdesivir treatment. Consultants:   Vascular   Subjective: Feels okay, no complaints.  Review of Systems  Otherwise negative except as per HPI, including: General = no fevers, chills, dizziness, malaise, fatigue HEENT/EYES = negative for pain, redness, loss of vision, double vision, blurred vision, loss of hearing, sore throat, hoarseness, dysphagia Cardiovascular= negative for chest pain, palpitation, murmurs, lower extremity swelling Respiratory/lungs= negative for shortness of breath, cough, hemoptysis, wheezing, mucus production Gastrointestinal= negative for nausea, vomiting,, abdominal pain, melena, hematemesis Genitourinary= negative for Dysuria, Hematuria, Change in Urinary Frequency MSK = Negative for arthralgia, myalgias, Back Pain, Joint swelling  Neurology= Negative for headache, seizures, numbness, tingling  Psychiatry= Negative for anxiety, depression, suicidal and homocidal ideation Allergy/Immunology= Medication/Food allergy as listed  Skin= Negative for Rash, lesions, ulcers, itching   Objective: Vitals:   05/12/19 0100 05/12/19 0109 05/12/19 0459 05/12/19 0808  BP:   133/76 118/78  Pulse: 64 71 71 64  Resp: (!) 25 16 16 12   Temp: 98.2 F (36.8 C)  97.9 F (36.6 C) 97.6 F (36.4 C)  TempSrc: Oral  Oral Oral  SpO2: 95% 97%  95%  Weight:      Height:        Intake/Output Summary (Last 24 hours) at 05/12/2019 1110 Last data filed at 05/12/2019 0947 Gross per 24 hour  Intake 760 ml  Output 1700 ml  Net -940 ml   Filed Weights   05/11/19 1235  Weight: 74.8 kg    Examination:  Constitutional: Not in acute distress Respiratory: Minimal bibasilar rhonchi Cardiovascular: Normal sinus rhythm, no rubs Abdomen: Nontender nondistended good bowel sounds Musculoskeletal: No edema noted Skin: No rashes seen Neurologic: CN 2-12 grossly intact.  And nonfocal Psychiatric: Normal judgment and insight. Alert and oriented x 3. Normal mood.     Data Reviewed:   CBC: Recent Labs  Lab 05/10/19 1159 05/10/19 1235 05/11/19 0422 05/12/19 [id number]  WBC 4.9  --  4.1 3.2*   NEUTROABS 3.8  --   --   --   HGB 14.2 16.0 12.4* 12.7*  HCT 45.3 47.0 39.6 39.7  MCV 83.3  --  83.7 82.5  PLT 170  --  150 [id number]*   Basic Metabolic Panel: Recent Labs  Lab 05/10/19 1159 05/10/19 1235 05/11/19 0422 05/11/19 1515 05/12/19 0447  NA 135 136 135 135 135  K 2.9* 2.9* 2.8* 4.0 3.8  CL 92* 92* 98 101 99  CO2 27  --  25 24 25   GLUCOSE 103* 101* 137* 145* 155*  BUN 12 14 11 14 15   CREATININE 0.95 1.00 0.98 0.93 0.81  CALCIUM 8.8*  --  8.3* 8.6* 9.0  MG  --   --  1.8  --  1.7   GFR: Estimated Creatinine Clearance: 95 mL/min (by C-G formula based on SCr of 0.81 mg/dL). Liver Function Tests: Recent Labs  Lab 05/10/19 1159  AST 27  ALT 30  ALKPHOS 87  BILITOT 0.4  PROT 7.7  ALBUMIN 3.3*   No results for input(s): LIPASE, AMYLASE in the last 168 hours. No results for input(s): AMMONIA in the last 168 hours. Coagulation Profile: No results for input(s): INR, PROTIME in the last 168 hours. Cardiac Enzymes: No results for input(s): CKTOTAL, CKMB, CKMBINDEX, TROPONINI in the last 168 hours. BNP (last 3 results) No results for input(s): PROBNP in the last 8760 hours. HbA1C: No results for input(s): HGBA1C in the last 72 hours. CBG: No results for input(s): GLUCAP in the last 168 hours. Lipid Profile: Recent Labs    05/10/19 1159  TRIG 186*   Thyroid Function Tests: No results for input(s): TSH, T4TOTAL, FREET4, T3FREE, THYROIDAB in the last 72 hours. Anemia Panel: Recent Labs    05/10/19 1159 05/12/19 0447  FERRITIN 129 132   Sepsis Labs: Recent Labs  Lab 05/10/19 1157 05/10/19 1159 05/10/19 1910  PROCALCITON  --  0.10  --   LATICACIDVEN 1.5  --  1.1    Recent Results (from the past 240 hour(s))  SARS CORONAVIRUS 2 (TAT 6-24 HRS) Nasopharyngeal Nasopharyngeal Swab     Status: Abnormal   Collection Time: 05/10/19 11:57 AM   Specimen: Nasopharyngeal Swab  Result Value Ref Range Status   SARS Coronavirus 2 POSITIVE (A) NEGATIVE Final     Comment: RESULT CALLED TO, READ BACK BY AND VERIFIED WITH: RYLAND KENNEDY RN.@[email or web address]  ON 11.14.2020 BY TCALDWELL MT. (NOTE) SARS-CoV-2 target nucleic acids are DETECTED. The SARS-CoV-2 RNA is generally detectable in upper and lower respiratory specimens during the acute phase of infection. Positive results are indicative of active infection with SARS-CoV-2. Clinical  correlation with patient history and other diagnostic information is necessary to determine patient infection status. Positive results do  not rule out bacterial infection or co-infection with other viruses. The expected result is Negative. Fact Sheet for Patients: SugarRoll.be Fact Sheet for Healthcare Providers: https://www.woods-mathews.com/ This test is not yet approved or cleared by the [place] FDA and  has been authorized for detection and/or diagnosis of SARS-CoV-2 by FDA under an Emergency Use Authorization (EUA). This EUA will remain  in effect (meaning this test  can be used) for the duration of the COVID-19 declaration under Section 564(b)(1) of the Act, 21 U.S.C. section 360bbb-3(b)(1), unless the authorization is terminated or revoked sooner. Performed at [facility], [place] [address], [place],  [address]   Blood Culture (routine x 2)     Status: None (  Preliminary result)   Collection Time: 05/10/19 12:49 PM   Specimen: BLOOD  Result Value Ref Range Status   Specimen Description BLOOD SITE NOT SPECIFIED  Final   Special Requests   Final    BOTTLES DRAWN AEROBIC AND ANAEROBIC Blood Culture adequate volume   Culture   Final    NO GROWTH 2 DAYS Performed at [facility], [address], [place], [place] [address]    Report Status PENDING  Incomplete  Blood Culture (routine x 2)     Status: None (Preliminary result)   Collection Time: 05/10/19 12:50 PM   Specimen: BLOOD  Result Value Ref Range Status   Specimen Description BLOOD RIGHT  ANTECUBITAL  Final   Special Requests   Final    BOTTLES DRAWN AEROBIC AND ANAEROBIC Blood Culture results may not be optimal due to an excessive volume of blood received in culture bottles   Culture   Final    NO GROWTH 2 DAYS Performed at [facility], [place] [address], [place],  [address]    Report Status PENDING  Incomplete         Radiology Studies: No results found.      Scheduled Meds: . amLODipine  10 mg Oral Daily  . atorvastatin  80 mg Oral q1800  . chlorthalidone  25 mg Oral Daily  . clopidogrel  75 mg Oral Daily  . docusate sodium  100 mg Oral Daily  . enoxaparin (LOVENOX) injection  40 mg Subcutaneous Q24H  . Ipratropium-Albuterol  1 puff Inhalation Q6H  . methylPREDNISolone (SOLU-MEDROL) injection  40 mg Intravenous Q8H   Continuous Infusions: . remdesivir 100 mg in NS 250 mL 100 mg (05/12/19 0947)     LOS: 2 days   Time spent= 25 mins    Jenia Klepper Arsenio Loader, MD [facility]  If 7PM-7AM, please contact night-coverage  05/12/2019, 11:10 AM

## 2019-05-12 NOTE — Progress Notes (Signed)
[name] [name], (pt SO) called, no answer, message left.

## 2019-05-12 NOTE — Plan of Care (Signed)

## 2019-05-13 LAB — CBC
HCT: 38.7 % — ABNORMAL LOW (ref 39.0–52.0)
Hemoglobin: 12.1 g/dL — ABNORMAL LOW (ref 13.0–17.0)
MCH: 25.9 pg — ABNORMAL LOW (ref 26.0–34.0)
MCHC: 31.3 g/dL (ref 30.0–36.0)
MCV: 82.9 fL (ref 80.0–100.0)
Platelets: 148 10*3/uL — ABNORMAL LOW (ref 150–400)
RBC: 4.67 MIL/uL (ref 4.22–5.81)
RDW: 14.1 % (ref 11.5–15.5)
WBC: 7.3 10*3/uL (ref 4.0–10.5)
nRBC: 0 % (ref 0.0–0.2)

## 2019-05-13 LAB — HEPATIC FUNCTION PANEL
ALT: 18 U/L (ref 0–44)
AST: 16 U/L (ref 15–41)
Albumin: 3 g/dL — ABNORMAL LOW (ref 3.5–5.0)
Alkaline Phosphatase: 64 U/L (ref 38–126)
Bilirubin, Direct: <0.1 mg/dL (ref 0.0–0.2)
Total Bilirubin: 0.2 mg/dL — ABNORMAL LOW (ref 0.3–1.2)
Total Protein: 6.9 g/dL (ref 6.5–8.1)

## 2019-05-13 LAB — MAGNESIUM: Magnesium: 1.4 mg/dL — ABNORMAL LOW (ref 1.7–2.4)

## 2019-05-13 LAB — BASIC METABOLIC PANEL
Anion gap: 16 — ABNORMAL HIGH (ref 5–15)
BUN: 17 mg/dL (ref 6–20)
CO2: 24 mmol/L (ref 22–32)
Calcium: 8.8 mg/dL — ABNORMAL LOW (ref 8.9–10.3)
Chloride: 95 mmol/L — ABNORMAL LOW (ref 98–111)
Creatinine, Ser: 0.82 mg/dL (ref 0.61–1.24)
GFR calc Af Amer: >60 mL/min (ref >60)
GFR calc non Af Amer: >60 mL/min (ref >60)
Glucose, Bld: 254 mg/dL — ABNORMAL HIGH (ref 70–99)
Potassium: 3.4 mmol/L — ABNORMAL LOW (ref 3.5–5.1)
Sodium: 135 mmol/L (ref 135–145)

## 2019-05-13 LAB — C-REACTIVE PROTEIN: CRP: 3.2 mg/dL — ABNORMAL HIGH (ref <1.0)

## 2019-05-13 LAB — LACTATE DEHYDROGENASE: LDH: 160 U/L (ref 98–192)

## 2019-05-13 LAB — FERRITIN: Ferritin: 152 ng/mL (ref 24–336)

## 2019-05-13 LAB — D-DIMER, QUANTITATIVE: D-Dimer, Quant: 1.93 ug/mL-FEU — ABNORMAL HIGH (ref 0.00–0.50)

## 2019-05-13 MED ORDER — GUAIFENESIN-DM 100-10 MG/5ML PO SYRP
10.0000 mL | ORAL_SOLUTION | ORAL | Status: DC | PRN
  Administered 2019-05-14 – 2019-05-15 (×4): 10 mL via ORAL
  Filled 2019-05-13 (×4): qty 10

## 2019-05-13 MED ORDER — MAGNESIUM OXIDE 400 (241.3 MG) MG PO TABS
800.0000 mg | ORAL_TABLET | Freq: Once | ORAL | Status: AC
  Administered 2019-05-13: 800 mg via ORAL
  Filled 2019-05-13: qty 2

## 2019-05-13 MED ORDER — METHYLPREDNISOLONE SODIUM SUCC 40 MG IJ SOLR
40.0000 mg | Freq: Two times a day (BID) | INTRAMUSCULAR | Status: DC
  Administered 2019-05-13 – 2019-05-15 (×4): 40 mg via INTRAVENOUS
  Filled 2019-05-13 (×4): qty 1

## 2019-05-13 MED ORDER — POTASSIUM CHLORIDE CRYS ER 20 MEQ PO TBCR
40.0000 meq | EXTENDED_RELEASE_TABLET | Freq: Once | ORAL | Status: AC
  Administered 2019-05-13: 40 meq via ORAL
  Filled 2019-05-13: qty 2

## 2019-05-13 NOTE — Progress Notes (Signed)
Kim (SO of pt) called, no answer, message left

## 2019-05-13 NOTE — Progress Notes (Signed)
PROGRESS NOTE    [name] [name]  [id number] DOB: 1961/02/20 DOA: 05/10/2019 PCP: Jean Rosenthal, MD   Brief Narrative:  58 year old with history of HTN, CAD, HLD, PVD status post left-sided femoropopliteal bypass 02/2019, rheumatoid arthritis presented with URI type symptoms with started 6 days prior to hospital admission.  Reported of some right lower extremity pain but was seen by vascular who recommended outpatient follow-up.  Patient transferred to [facility] for further management.  Currently on Solu-Medrol and remdesivir.  Inflammatory markers improving   Assessment & Plan:   Principal Problem:   COVID-19 Active Problems:   HLD (hyperlipidemia)   Rheumatoid arthritis (HCC)   HTN (hypertension)   PAD (peripheral artery disease) (HCC)   Ischemic ulcer of toe of left foot (HCC)   Acute respiratory distress secondary to COVID-19 pneumonia -Oxygen levels - Currently on RA -Remdesivir-day 3 -Solumedrol 40mg  reduced to q12hrs.  -CRP slightly trending up, other markers look okay -Vitamin C & Zinc. Prone >16hrs/day.  -Procalcitonin negative -Chest x-ray-my right lower lobe opacity -Supportive care-antitussive, inhalers, I-S/flutter -CODE STATUS confirmed  Hypokalemia/hypomagnesemia -Replete as needed  Peripheral arterial disease Status post left femoropopliteal bypass 02/2019 -Continue home regimen Plavix and statin. -Seen by vascular surgery, recommend outpatient follow-up with Dr. Trula Slade  Coronary artery disease -Plavix, statin.  Essential hypertension -Norvasc 10 mg daily.  Chlorthalidone 25 mg daily.  Chronic low back pain -Pain medications with bowel regimen  Rheumatoid arthritis -Home regimen prednisone 5 mg daily currently on hold as patient is on IV Solu-Medrol  GERD -PPI  DVT prophylaxis: Lovenox Code Status: Full code Family Communication: Spoke with him, his significant other Disposition Plan: Maintain hospital stay to complete his IV remdesivir  course  Consultants:   Vascular   Subjective: Denies any complaints this morning.  Review of Systems Otherwise negative except as per HPI, including: General = no fevers, chills, dizziness, malaise, fatigue HEENT/EYES = negative for pain, redness, loss of vision, double vision, blurred vision, loss of hearing, sore throat, hoarseness, dysphagia Cardiovascular= negative for chest pain, palpitation, murmurs, lower extremity swelling Respiratory/lungs= negative for shortness of breath, cough, hemoptysis, wheezing, mucus production Gastrointestinal= negative for nausea, vomiting,, abdominal pain, melena, hematemesis Genitourinary= negative for Dysuria, Hematuria, Change in Urinary Frequency MSK = Negative for arthralgia, myalgias, Back Pain, Joint swelling  Neurology= Negative for headache, seizures, numbness, tingling  Psychiatry= Negative for anxiety, depression, suicidal and homocidal ideation Allergy/Immunology= Medication/Food allergy as listed  Skin= Negative for Rash, lesions, ulcers, itching    Objective: Vitals:   05/12/19 0808 05/12/19 1645 05/12/19 1927 05/13/19 0426  BP: 118/78 139/87 124/88 131/87  Pulse: 64 87 82 79  Resp: 12 18 (!) 21 19  Temp: 97.6 F (36.4 C) 97.7 F (36.5 C) 97.7 F (36.5 C) 97.6 F (36.4 C)  TempSrc: Oral Oral Oral Oral  SpO2: 95% 95% 96% 96%  Weight:      Height:        Intake/Output Summary (Last 24 hours) at 05/13/2019 [id number] Last data filed at 05/13/2019 0200 Gross per 24 hour  Intake 1450 ml  Output 1325 ml  Net 125 ml   Filed Weights   05/11/19 1235  Weight: 74.8 kg    Examination:  Constitutional: Not in acute distress Respiratory: Minimal bibasilar rhonchi Cardiovascular: Normal sinus rhythm, no rubs Abdomen: Nontender nondistended good bowel sounds Musculoskeletal: No edema noted Skin: No rashes seen Neurologic: CN 2-12 grossly intact.  And nonfocal Psychiatric: Normal judgment and insight. Alert and oriented x 3.  Normal mood.     Data Reviewed:   CBC: Recent Labs  Lab 05/10/19 1159 05/10/19 1235 05/11/19 0422 05/12/19 0447  WBC 4.9  --  4.1 3.2*  NEUTROABS 3.8  --   --   --   HGB 14.2 16.0 12.4* 12.7*  HCT 45.3 47.0 39.6 39.7  MCV 83.3  --  83.7 82.5  PLT 170  --  150 [id number]*   Basic Metabolic Panel: Recent Labs  Lab 05/10/19 1159 05/10/19 1235 05/11/19 0422 05/11/19 1515 05/12/19 0447  NA 135 136 135 135 135  K 2.9* 2.9* 2.8* 4.0 3.8  CL 92* 92* 98 101 99  CO2 27  --  25 24 25   GLUCOSE 103* 101* 137* 145* 155*  BUN 12 14 11 14 15   CREATININE 0.95 1.00 0.98 0.93 0.81  CALCIUM 8.8*  --  8.3* 8.6* 9.0  MG  --   --  1.8  --  1.7   GFR: Estimated Creatinine Clearance: 95 mL/min (by C-G formula based on SCr of 0.81 mg/dL). Liver Function Tests: Recent Labs  Lab 05/10/19 1159  AST 27  ALT 30  ALKPHOS 87  BILITOT 0.4  PROT 7.7  ALBUMIN 3.3*   No results for input(s): LIPASE, AMYLASE in the last 168 hours. No results for input(s): AMMONIA in the last 168 hours. Coagulation Profile: No results for input(s): INR, PROTIME in the last 168 hours. Cardiac Enzymes: No results for input(s): CKTOTAL, CKMB, CKMBINDEX, TROPONINI in the last 168 hours. BNP (last 3 results) No results for input(s): PROBNP in the last 8760 hours. HbA1C: No results for input(s): HGBA1C in the last 72 hours. CBG: No results for input(s): GLUCAP in the last 168 hours. Lipid Profile: Recent Labs    05/10/19 1159  TRIG 186*   Thyroid Function Tests: No results for input(s): TSH, T4TOTAL, FREET4, T3FREE, THYROIDAB in the last 72 hours. Anemia Panel: Recent Labs    05/10/19 1159 05/12/19 0447  FERRITIN 129 132   Sepsis Labs: Recent Labs  Lab 05/10/19 1157 05/10/19 1159 05/10/19 1910  PROCALCITON  --  0.10  --   LATICACIDVEN 1.5  --  1.1    Recent Results (from the past 240 hour(s))  SARS CORONAVIRUS 2 (TAT 6-24 HRS) Nasopharyngeal Nasopharyngeal Swab     Status: Abnormal   Collection  Time: 05/10/19 11:57 AM   Specimen: Nasopharyngeal Swab  Result Value Ref Range Status   SARS Coronavirus 2 POSITIVE (A) NEGATIVE Final    Comment: RESULT CALLED TO, READ BACK BY AND VERIFIED WITH: RYLAND KENNEDY RN.@[email or web address]  ON 11.14.2020 BY TCALDWELL MT. (NOTE) SARS-CoV-2 target nucleic acids are DETECTED. The SARS-CoV-2 RNA is generally detectable in upper and lower respiratory specimens during the acute phase of infection. Positive results are indicative of active infection with SARS-CoV-2. Clinical  correlation with patient history and other diagnostic information is necessary to determine patient infection status. Positive results do  not rule out bacterial infection or co-infection with other viruses. The expected result is Negative. Fact Sheet for Patients: SugarRoll.be Fact Sheet for Healthcare Providers: https://www.woods-mathews.com/ This test is not yet approved or cleared by the [place] FDA and  has been authorized for detection and/or diagnosis of SARS-CoV-2 by FDA under an Emergency Use Authorization (EUA). This EUA will remain  in effect (meaning this test  can be used) for the duration of the COVID-19 declaration under Section 564(b)(1) of the Act, 21 U.S.C. section 360bbb-3(b)(1), unless the authorization is terminated or revoked sooner. Performed at [facility], [place] [address], [place], [name] [address]   Blood Culture (routine x 2)     Status: None (Preliminary result)   Collection Time: 05/10/19 12:49 PM   Specimen: BLOOD  Result Value Ref Range Status   Specimen Description BLOOD SITE NOT SPECIFIED  Final   Special Requests   Final    BOTTLES DRAWN AEROBIC AND ANAEROBIC Blood Culture adequate volume   Culture   Final    NO GROWTH 2 DAYS Performed at [place] [address] [place], [place] [address]    Report Status PENDING  Incomplete  Blood Culture (routine x 2)     Status: None  (Preliminary result)   Collection Time: 05/10/19 12:50 PM   Specimen: BLOOD  Result Value Ref Range Status   Specimen Description BLOOD RIGHT ANTECUBITAL  Final   Special Requests   Final    BOTTLES DRAWN AEROBIC AND ANAEROBIC Blood Culture results may not be optimal due to an excessive volume of blood received in culture bottles   Culture   Final    NO GROWTH 2 DAYS Performed at [facility], [place] [address] [place], [place] [address]    Report Status PENDING  Incomplete         Radiology Studies: No results found.      Scheduled Meds: . amLODipine  10 mg Oral Daily  . atorvastatin  80 mg Oral q1800  . chlorthalidone  25 mg Oral Daily  . clopidogrel  75 mg Oral Daily  . docusate sodium  100 mg Oral Daily  . enoxaparin (LOVENOX) injection  40 mg Subcutaneous Q24H  . Ipratropium-Albuterol  1 puff Inhalation Q6H  . methylPREDNISolone (SOLU-MEDROL) injection  40 mg Intravenous Q8H   Continuous Infusions: . remdesivir 100 mg in NS 250 mL 100 mg (05/12/19 0947)     LOS: 3 days   Time spent= 25 mins    Clorissa Gruenberg Arsenio Loader, MD [facility]  If 7PM-7AM, please contact night-coverage  05/13/2019, 7:28 AM

## 2019-05-14 LAB — CBC
HCT: 39.8 % (ref 39.0–52.0)
Hemoglobin: 12.5 g/dL — ABNORMAL LOW (ref 13.0–17.0)
MCH: 25.9 pg — ABNORMAL LOW (ref 26.0–34.0)
MCHC: 31.4 g/dL (ref 30.0–36.0)
MCV: 82.6 fL (ref 80.0–100.0)
Platelets: 169 10*3/uL (ref 150–400)
RBC: 4.82 MIL/uL (ref 4.22–5.81)
RDW: 14.1 % (ref 11.5–15.5)
WBC: 7.4 10*3/uL (ref 4.0–10.5)
nRBC: 0 % (ref 0.0–0.2)

## 2019-05-14 LAB — FERRITIN: Ferritin: 100 ng/mL (ref 24–336)

## 2019-05-14 LAB — C-REACTIVE PROTEIN: CRP: 1.4 mg/dL — ABNORMAL HIGH (ref <1.0)

## 2019-05-14 LAB — BASIC METABOLIC PANEL
Anion gap: 13 (ref 5–15)
BUN: 20 mg/dL (ref 6–20)
CO2: 25 mmol/L (ref 22–32)
Calcium: 8.8 mg/dL — ABNORMAL LOW (ref 8.9–10.3)
Chloride: 97 mmol/L — ABNORMAL LOW (ref 98–111)
Creatinine, Ser: 0.73 mg/dL (ref 0.61–1.24)
GFR calc Af Amer: >60 mL/min (ref >60)
GFR calc non Af Amer: >60 mL/min (ref >60)
Glucose, Bld: 193 mg/dL — ABNORMAL HIGH (ref 70–99)
Potassium: 3.7 mmol/L (ref 3.5–5.1)
Sodium: 135 mmol/L (ref 135–145)

## 2019-05-14 LAB — MAGNESIUM: Magnesium: 1.5 mg/dL — ABNORMAL LOW (ref 1.7–2.4)

## 2019-05-14 LAB — LACTATE DEHYDROGENASE: LDH: 137 U/L (ref 98–192)

## 2019-05-14 LAB — D-DIMER, QUANTITATIVE: D-Dimer, Quant: 1.42 ug/mL-FEU — ABNORMAL HIGH (ref 0.00–0.50)

## 2019-05-14 MED ORDER — SALINE SPRAY 0.65 % NA SOLN
1.0000 | NASAL | Status: DC | PRN
  Administered 2019-05-14 – 2019-05-15 (×2): 1 via NASAL
  Filled 2019-05-14 (×2): qty 44

## 2019-05-14 NOTE — Progress Notes (Signed)
Called and updated Kim (patient's Significant other) on patient's status. All questions answered. Informed her of plan for likely discharge tomorrow.

## 2019-05-14 NOTE — Progress Notes (Signed)
Patient ambulated self in hallway, returned to room and called RN to notify me of SOB. Went into assess patient, SpO2 95-96% after ambulation, however reporting dyspnea and discomfort. Does not appear to be in any distress. Placed on [facility] for comfort. Will continue to monitor.

## 2019-05-14 NOTE — Plan of Care (Signed)
Teaching continued with patient, all questions answered at this time. Discharge planning ongoing at this time, per discussion with team, plan for likely discharge tomorrow. Vital signs stable at this time. Ambulated patient on RA, SpO2 to 95% at the lowest with activity. WBC WNL this AM. Continuing with IV Remdesivir as ordered. Lungs diminished throughout, but respirations unlabored at this time. No c/o SOB. Remains on telemetry, NSR. OOB independently to ambulate in room to chair/BR. T&R independently. Good appetite, tolerating PO intake well. Emotional support provided as needed. +BS/+BM today. Patient c/o chronic back pain- receiving percocet with good effect. Safe environment of care maintained. Skin intact. Will continue to monitor.

## 2019-05-14 NOTE — Progress Notes (Signed)
PROGRESS NOTE    [name] [name]  [id number] DOB: 11-Dec-1960 DOA: 05/10/2019 PCP: Jean Rosenthal, MD   Brief Narrative:  58 year old with history of HTN, CAD, HLD, PVD status post left-sided femoropopliteal bypass 02/2019, rheumatoid arthritis presented with URI type symptoms with started 6 days prior to hospital admission.  Reported of some right lower extremity pain but was seen by vascular who recommended outpatient follow-up.  Patient transferred to [facility] for further management.  Currently on Solu-Medrol and remdesivir.  Inflammatory markers improving   Assessment & Plan:   Principal Problem:   COVID-19 Active Problems:   HLD (hyperlipidemia)   Rheumatoid arthritis (HCC)   HTN (hypertension)   PAD (peripheral artery disease) (HCC)   Ischemic ulcer of toe of left foot (HCC)   Acute respiratory distress secondary to COVID-19 pneumonia -Covid + 05/10/19 -Oxygen weaned - Currently on RA - follow ambulatory oxygen screen -Remdesivir-to finish 05/15/19 -Solumedrol 40mg  reduced to q12hrs.  -Inflammatory markers improving as below -Vitamin C & Zinc. Prone >16hrs/day.  -Procalcitonin negative -Chest x-ray- questionable right lower lobe opacity -Supportive care-antitussive, inhalers, I-S/flutter COVID-19 Labs  Recent Labs    05/12/19 0447 05/13/19 0020 05/14/19 0130  DDIMER 2.46* 1.93* 1.42*  FERRITIN 132 152 100  LDH 149 160 137  CRP 6.6* 3.2* 1.4*   Lab Results  Component Value Date   SARSCOV2NAA POSITIVE (A) 05/10/2019   [place] NEGATIVE 02/20/2019    Hypokalemia/hypomagnesemia -Improving with diet, follow labs  Peripheral arterial disease Status post left femoropopliteal bypass 02/2019 -Continue home regimen Plavix and statin. -Seen by vascular surgery, recommend outpatient follow-up with Dr. Trula Slade  Coronary artery disease -Plavix, statin.  Essential hypertension -Norvasc 10 mg daily.  Chlorthalidone 25 mg daily.  Chronic low back pain  -Pain medications with bowel regimen  Rheumatoid arthritis -Home regimen prednisone 5 mg daily currently on hold as patient is on IV Solu-Medrol  GERD -PPI  DVT prophylaxis: Lovenox Code Status: Full code Family Communication: Spoke with him, his significant other Disposition Plan: Maintain hospital stay to complete his IV remdesivir course  Consultants:   Vascular   Subjective: No acute issues or events overnight.  Denies chest pain, shortness of breath, nausea, vomiting, diarrhea, constipation, headache, fevers, chills.   Objective: Vitals:   05/14/19 0445 05/14/19 0620 05/14/19 0640 05/14/19 0808  BP: 131/87   (!) 133/94  Pulse: 71 73  65  Resp: (!) 21 12  16   Temp: 97.8 F (36.6 C)   (!) 97.3 F (36.3 C)  TempSrc: Axillary   Oral  SpO2: 97% 94% 95% 97%  Weight:      Height:        Intake/Output Summary (Last 24 hours) at 05/14/2019 0815 Last data filed at 05/14/2019 [id number] Gross per 24 hour  Intake 1570 ml  Output 700 ml  Net 870 ml   Filed Weights   05/11/19 1235  Weight: 74.8 kg    Examination:  Constitutional: Not in acute distress Respiratory: Minimal bibasilar rhonchi Cardiovascular: Normal sinus rhythm, no rubs Abdomen: Nontender nondistended good bowel sounds Musculoskeletal: No edema noted Skin: No rashes seen Neurologic: CN 2-12 grossly intact.  And nonfocal Psychiatric: Normal judgment and insight. Alert and oriented x 3. Normal mood.     Data Reviewed:   CBC: Recent Labs  Lab 05/10/19 1159 05/10/19 1235 05/11/19 0422 05/12/19 0447 05/13/19 0020 05/14/19 0130  WBC 4.9  --  4.1 3.2* 7.3 7.4  NEUTROABS 3.8  --   --   --   --   --  HGB 14.2 16.0 12.4* 12.7* 12.1* 12.5*  HCT 45.3 47.0 39.6 39.7 38.7* 39.8  MCV 83.3  --  83.7 82.5 82.9 82.6  PLT 170  --  150 127* 148* [id number]   Basic Metabolic Panel: Recent Labs  Lab 05/11/19 0422 05/11/19 1515 05/12/19 0447 05/13/19 0020 05/14/19 0130  NA 135 135 135 135 135  K 2.8* 4.0  3.8 3.4* 3.7  CL 98 101 99 95* 97*  CO2 25 24 25 24 25   GLUCOSE 137* 145* 155* 254* 193*  BUN 11 14 15 17 20   CREATININE 0.98 0.93 0.81 0.82 0.73  CALCIUM 8.3* 8.6* 9.0 8.8* 8.8*  MG 1.8  --  1.7 1.4* 1.5*   GFR: Estimated Creatinine Clearance: 96.2 mL/min (by C-G formula based on SCr of 0.73 mg/dL).   Liver Function Tests: Recent Labs  Lab 05/10/19 1159 05/13/19 0020  AST 27 16  ALT 30 18  ALKPHOS 87 64  BILITOT 0.4 0.2*  PROT 7.7 6.9  ALBUMIN 3.3* 3.0*   Anemia Panel: Recent Labs    05/13/19 0020 05/14/19 0130  FERRITIN 152 100   Sepsis Labs: Recent Labs  Lab 05/10/19 1157 05/10/19 1159 05/10/19 1910  PROCALCITON  --  0.10  --   LATICACIDVEN 1.5  --  1.1    Recent Results (from the past 240 hour(s))  SARS CORONAVIRUS 2 (TAT 6-24 HRS) Nasopharyngeal Nasopharyngeal Swab     Status: Abnormal   Collection Time: 05/10/19 11:57 AM   Specimen: Nasopharyngeal Swab  Result Value Ref Range Status   SARS Coronavirus 2 POSITIVE (A) NEGATIVE Final    Comment: RESULT CALLED TO, READ BACK BY AND VERIFIED WITH: RYLAND KENNEDY RN.@[email or web address]  ON 11.14.2020 BY TCALDWELL MT. (NOTE) SARS-CoV-2 target nucleic acids are DETECTED. The SARS-CoV-2 RNA is generally detectable in upper and lower respiratory specimens during the acute phase of infection. Positive results are indicative of active infection with SARS-CoV-2. Clinical  correlation with patient history and other diagnostic information is necessary to determine patient infection status. Positive results do  not rule out bacterial infection or co-infection with other viruses. The expected result is Negative. Fact Sheet for Patients: SugarRoll.be Fact Sheet for Healthcare Providers: https://www.woods-mathews.com/ This test is not yet approved or cleared by the [place] FDA and  has been authorized for detection and/or diagnosis of SARS-CoV-2 by FDA under an Emergency Use  Authorization (EUA). This EUA will remain  in effect (meaning this test  can be used) for the duration of the COVID-19 declaration under Section 564(b)(1) of the Act, 21 U.S.C. section 360bbb-3(b)(1), unless the authorization is terminated or revoked sooner. Performed at [facility], [place] [address] [place], [place] [address]   Blood Culture (routine x 2)     Status: None (Preliminary result)   Collection Time: 05/10/19 12:49 PM   Specimen: BLOOD  Result Value Ref Range Status   Specimen Description BLOOD SITE NOT SPECIFIED  Final   Special Requests   Final    BOTTLES DRAWN AEROBIC AND ANAEROBIC Blood Culture adequate volume   Culture   Final    NO GROWTH 3 DAYS Performed at [facility], [address] [place], [place] [address]    Report Status PENDING  Incomplete  Blood Culture (routine x 2)     Status: None (Preliminary result)   Collection Time: 05/10/19 12:50 PM   Specimen: BLOOD  Result Value Ref Range Status   Specimen Description BLOOD RIGHT ANTECUBITAL  Final   Special Requests  Final    BOTTLES DRAWN AEROBIC AND ANAEROBIC Blood Culture results may not be optimal due to an excessive volume of blood received in culture bottles   Culture   Final    NO GROWTH 3 DAYS Performed at [facility], [place] [address] [place], [place] [address]    Report Status PENDING  Incomplete         Radiology Studies: No results found.      Scheduled Meds: . amLODipine  10 mg Oral Daily  . atorvastatin  80 mg Oral q1800  . chlorthalidone  25 mg Oral Daily  . clopidogrel  75 mg Oral Daily  . docusate sodium  100 mg Oral Daily  . enoxaparin (LOVENOX) injection  40 mg Subcutaneous Q24H  . Ipratropium-Albuterol  1 puff Inhalation Q6H  . methylPREDNISolone (SOLU-MEDROL) injection  40 mg Intravenous Q12H   Continuous Infusions: . remdesivir 100 mg in NS 250 mL 100 mg (05/13/19 0737)     LOS: 4 days   Time spent= 25 mins  Little Ishikawa, DO [facility]  If 7PM-7AM, please contact night-coverage  05/14/2019, 8:15 AM

## 2019-05-15 LAB — CULTURE, BLOOD (ROUTINE X 2)
Culture: NO GROWTH
Culture: NO GROWTH
Special Requests: ADEQUATE

## 2019-05-15 LAB — CBC
HCT: 38.9 % — ABNORMAL LOW (ref 39.0–52.0)
Hemoglobin: 12.4 g/dL — ABNORMAL LOW (ref 13.0–17.0)
MCH: 26.1 pg (ref 26.0–34.0)
MCHC: 31.9 g/dL (ref 30.0–36.0)
MCV: 81.9 fL (ref 80.0–100.0)
Platelets: 179 10*3/uL (ref 150–400)
RBC: 4.75 MIL/uL (ref 4.22–5.81)
RDW: 14.2 % (ref 11.5–15.5)
WBC: 6.8 10*3/uL (ref 4.0–10.5)
nRBC: 0 % (ref 0.0–0.2)

## 2019-05-15 LAB — BASIC METABOLIC PANEL
Anion gap: 12 (ref 5–15)
BUN: 25 mg/dL — ABNORMAL HIGH (ref 6–20)
CO2: 26 mmol/L (ref 22–32)
Calcium: 8.9 mg/dL (ref 8.9–10.3)
Chloride: 95 mmol/L — ABNORMAL LOW (ref 98–111)
Creatinine, Ser: 0.8 mg/dL (ref 0.61–1.24)
GFR calc Af Amer: >60 mL/min (ref >60)
GFR calc non Af Amer: >60 mL/min (ref >60)
Glucose, Bld: 197 mg/dL — ABNORMAL HIGH (ref 70–99)
Potassium: 3.8 mmol/L (ref 3.5–5.1)
Sodium: 133 mmol/L — ABNORMAL LOW (ref 135–145)

## 2019-05-15 LAB — D-DIMER, QUANTITATIVE: D-Dimer, Quant: 1.25 ug/mL-FEU — ABNORMAL HIGH (ref 0.00–0.50)

## 2019-05-15 LAB — LACTATE DEHYDROGENASE: LDH: 136 U/L (ref 98–192)

## 2019-05-15 LAB — MAGNESIUM: Magnesium: 1.6 mg/dL — ABNORMAL LOW (ref 1.7–2.4)

## 2019-05-15 LAB — FERRITIN: Ferritin: 98 ng/mL (ref 24–336)

## 2019-05-15 LAB — C-REACTIVE PROTEIN: CRP: 0.9 mg/dL (ref <1.0)

## 2019-05-15 MED ORDER — PREDNISONE 10 MG PO TABS: ORAL_TABLET | ORAL | 0 refills | Status: AC

## 2019-05-15 NOTE — Discharge Summary (Signed)
Physician Discharge Summary  [name] [name] [id number] DOB: 1961/04/17 DOA: 05/10/2019  PCP: Jean Rosenthal, MD  Admit date: 05/10/2019 Discharge date: 05/15/2019  Admitted From: Home Disposition: Home  Recommendations for Outpatient Follow-up:  1. Follow up with PCP in 1-2 weeks 2. Please obtain BMP/CBC in one week  Home Health: None Equipment/Devices: None  Discharge Condition: Stable CODE STATUS: Full Diet recommendation: As tolerated  Brief/Interim Summary: 58 year old with history of HTN, CAD, HLD, PVD status post left-sided femoropopliteal bypass 02/2019, rheumatoid arthritis presented with URI type symptoms with started 6 days prior to hospital admission.  Reported of some right lower extremity pain but was seen by vascular who recommended outpatient follow-up.  Patient transferred to [facility] for further management.  Currently on Solu-Medrol and remdesivir.  Inflammatory markers improving.  Patient admitted as above with acute hypoxic respiratory failure in the setting of COVID-19 pneumonia.  At this time patient has completed remdesivir course, now on room air without exertional shortness of breath or hypoxia.  Patient will be discharged home to continue steroid taper.  Close follow-up with PCP in the next 1 to 2 weeks with telephone, video, in person visit to ensure resolution of symptoms.  Patient otherwise educated on need for ongoing quarantine given hospitalization recent COVID-19 infection.  Otherwise patient feels quite well and stable and agreeable for discharge home.  Discharge Diagnoses:  Principal Problem:   COVID-19 Active Problems:   HLD (hyperlipidemia)   Rheumatoid arthritis (HCC)   HTN (hypertension)   PAD (peripheral artery disease) (HCC)   Ischemic ulcer of toe of left foot [facility])    Discharge Instructions  Discharge Instructions    Call MD for:  difficulty breathing, headache or visual disturbances   Complete by: As directed    Call MD for:   extreme fatigue   Complete by: As directed    Call MD for:  hives   Complete by: As directed    Call MD for:  persistant dizziness or light-headedness   Complete by: As directed    Call MD for:  persistant nausea and vomiting   Complete by: As directed    Call MD for:  severe uncontrolled pain   Complete by: As directed    Call MD for:  temperature >100.4   Complete by: As directed    Diet - low sodium heart healthy   Complete by: As directed    Increase activity slowly   Complete by: As directed      Allergies as of 05/15/2019      Reactions   Morphine And Related Rash   Ramipril Rash   Per patient on R arm and leg only; no angioedema   Tramadol Itching, Rash      Medication List    TAKE these medications   amLODipine 10 MG tablet Commonly known as: NORVASC Take 1 tablet (10 mg total) by mouth daily.   atorvastatin 80 MG tablet Commonly known as: LIPITOR Take 1 tablet (80 mg total) by mouth daily at 6 PM.   chlorthalidone 25 MG tablet Commonly known as: HYGROTON TAKE 1 TABLET BY MOUTH EVERY DAY   clopidogrel 75 MG tablet Commonly known as: Plavix Take 1 tablet (75 mg total) by mouth daily.   docusate sodium 100 MG capsule Commonly known as: COLACE Take 1 capsule (100 mg total) by mouth daily. What changed:   when to take this  reasons to take this   naloxone 2 MG/2ML injection Commonly known as: NARCAN Inject 2  mg into the muscle once as needed (opiod overdose (call 911, inject intramuscularly in shoulder or thigh. Repeat every 3 minutes)).   nicotine 21 mg/24hr patch Commonly known as: NICODERM CQ - dosed in mg/24 hours Place 1 patch (21 mg total) onto the skin daily.   oxyCODONE-acetaminophen 10-325 MG tablet Commonly known as: PERCOCET Take 1 tablet by mouth See admin instructions. Five times daily as needed for pain.   pantoprazole 40 MG tablet Commonly known as: PROTONIX Take 1 tablet (40 mg total) by mouth daily.   polyethylene glycol 17 g  packet Commonly known as: MIRALAX / GLYCOLAX Take 17 g by mouth daily as needed for mild constipation.   predniSONE 10 MG tablet Commonly known as: DELTASONE Take 4 tablets (40 mg total) by mouth daily for 3 days, THEN 3 tablets (30 mg total) daily for 3 days, THEN 2 tablets (20 mg total) daily for 3 days, THEN 1 tablet (10 mg total) daily for 3 days. Start taking on: May 15, 2019 What changed:   medication strength  See the new instructions.   REFRESH DRY EYE THERAPY OP Place 1 drop into both eyes 3 (three) times daily as needed (dry/irritated eyes.).       Allergies  Allergen Reactions  . Morphine And Related Rash  . Ramipril Rash    Per patient on R arm and leg only; no angioedema  . Tramadol Itching and Rash    Procedures/Studies: Dg Chest Portable 1 View  Result Date: 05/10/2019 CLINICAL DATA:  Shortness of breath, chills. EXAM: PORTABLE CHEST 1 VIEW COMPARISON:  06/17/2018 FINDINGS: Stable top-normal heart size. Stable atherosclerosis and tortuosity of the thoracic aorta. Lungs demonstrate stable emphysematous disease. Mild opacity in the right lower lung may be related to some chronic scarring. Subtle pneumonia cannot be excluded. IMPRESSION: 1. Mild opacity in the right lower lung may be related to some chronic scarring. Subtle pneumonia cannot be excluded. 2. Stable emphysematous disease. Electronically Signed   By: Aletta Edouard M.D.   On: 05/10/2019 11:09    Subjective: No acute issues or events overnight, patient denies chest pain, shortness of breath, nausea, vomiting, diarrhea, constipation, headache, fevers, chills.   Discharge Exam: Vitals:   05/15/19 0430 05/15/19 0745  BP: 140/84 (!) 137/91  Pulse: 61 71  Resp: 19 14  Temp: 97.8 F (36.6 C) 97.7 F (36.5 C)  SpO2: 100% 97%   Vitals:   05/14/19 1655 05/14/19 2015 05/15/19 0430 05/15/19 0745  BP: 117/82 140/87 140/84 (!) 137/91  Pulse: 67 72 61 71  Resp: 19 20 19 14   Temp: (!) 97.4 F (36.3  C) 97.7 F (36.5 C) 97.8 F (36.6 C) 97.7 F (36.5 C)  TempSrc: Oral Oral Oral Oral  SpO2: 97% 92% 100% 97%  Weight:      Height:        General:  Pleasantly resting in bed, No acute distress. HEENT:  Normocephalic atraumatic.  Sclerae nonicteric, noninjected.  Extraocular movements intact bilaterally. Neck:  Without mass or deformity.  Trachea is midline. Lungs:  Clear to auscultate bilaterally without rhonchi, wheeze, or rales. Heart:  Regular rate and rhythm.  Without murmurs, rubs, or gallops. Abdomen:  Soft, nontender, nondistended.  Without guarding or rebound. Extremities: Without cyanosis, clubbing, edema, or obvious deformity. Vascular:  Dorsalis pedis and posterior tibial pulses palpable bilaterally. Skin:  Warm and dry, no erythema, no ulcerations.   The results of significant diagnostics from this hospitalization (including imaging, microbiology, ancillary and laboratory) are listed  below for reference.     Microbiology: Recent Results (from the past 240 hour(s))  SARS CORONAVIRUS 2 (TAT 6-24 HRS) Nasopharyngeal Nasopharyngeal Swab     Status: Abnormal   Collection Time: 05/10/19 11:57 AM   Specimen: Nasopharyngeal Swab  Result Value Ref Range Status   SARS Coronavirus 2 POSITIVE (A) NEGATIVE Final    Comment: RESULT CALLED TO, READ BACK BY AND VERIFIED WITH: RYLAND KENNEDY RN.@1745  ON 11.14.2020 BY TCALDWELL MT. (NOTE) SARS-CoV-2 target nucleic acids are DETECTED. The SARS-CoV-2 RNA is generally detectable in upper and lower respiratory specimens during the acute phase of infection. Positive results are indicative of active infection with SARS-CoV-2. Clinical  correlation with patient history and other diagnostic information is necessary to determine patient infection status. Positive results do  not rule out bacterial infection or co-infection with other viruses. The expected result is Negative. Fact Sheet for  Patients: SugarRoll.be Fact Sheet for Healthcare Providers: https://www.woods-mathews.com/ This test is not yet approved or cleared by the [place] FDA and  has been authorized for detection and/or diagnosis of SARS-CoV-2 by FDA under an Emergency Use Authorization (EUA). This EUA will remain  in effect (meaning this test  can be used) for the duration of the COVID-19 declaration under Section 564(b)(1) of the Act, 21 U.S.C. section 360bbb-3(b)(1), unless the authorization is terminated or revoked sooner. Performed at [facility], [place] [address], [place], [place] [address]   Blood Culture (routine x 2)     Status: None (Preliminary result)   Collection Time: 05/10/19 12:49 PM   Specimen: BLOOD  Result Value Ref Range Status   Specimen Description BLOOD SITE NOT SPECIFIED  Final   Special Requests   Final    BOTTLES DRAWN AEROBIC AND ANAEROBIC Blood Culture adequate volume   Culture   Final    NO GROWTH 4 DAYS Performed at [facility], [address] [place], [place] [address]    Report Status PENDING  Incomplete  Blood Culture (routine x 2)     Status: None (Preliminary result)   Collection Time: 05/10/19 12:50 PM   Specimen: BLOOD  Result Value Ref Range Status   Specimen Description BLOOD RIGHT ANTECUBITAL  Final   Special Requests   Final    BOTTLES DRAWN AEROBIC AND ANAEROBIC Blood Culture results may not be optimal due to an excessive volume of blood received in culture bottles   Culture   Final    NO GROWTH 4 DAYS Performed at [facility], [place] [address], [place], [place] [address]    Report Status PENDING  Incomplete     Labs:  Basic Metabolic Panel: Recent Labs  Lab 05/11/19 0422 05/11/19 1515 05/12/19 0447 05/13/19 0020 05/14/19 0130 05/15/19 0110  NA 135 135 135 135 135 133*  K 2.8* 4.0 3.8 3.4* 3.7 3.8  CL 98 101 99 95* 97* 95*  CO2 25 24 25 24 25 26   GLUCOSE 137* 145* 155* 254* 193*  197*  BUN 11 14 15 17 20  25*  CREATININE 0.98 0.93 0.81 0.82 0.73 0.80  CALCIUM 8.3* 8.6* 9.0 8.8* 8.8* 8.9  MG 1.8  --  1.7 1.4* 1.5* 1.6*   Liver Function Tests: Recent Labs  Lab 05/10/19 1159 05/13/19 0020  AST 27 16  ALT 30 18  ALKPHOS 87 64  BILITOT 0.4 0.2*  PROT 7.7 6.9  ALBUMIN 3.3* 3.0*   CBC: Recent Labs  Lab 05/10/19 1159  05/11/19 0422 05/12/19 0447 05/13/19 0020  05/14/19 0130 05/15/19 0110  WBC 4.9  --  4.1 3.2* 7.3 7.4 6.8  NEUTROABS 3.8  --   --   --   --   --   --   HGB 14.2   < > 12.4* 12.7* 12.1* 12.5* 12.4*  HCT 45.3   < > 39.6 39.7 38.7* 39.8 38.9*  MCV 83.3  --  83.7 82.5 82.9 82.6 81.9  PLT 170  --  150 127* 148* 169 179   < > = values in this interval not displayed.   D-Dimer Recent Labs    05/14/19 0130 05/15/19 0110  DDIMER 1.42* 1.25*   Anemia work up Recent Labs    05/14/19 0130 05/15/19 0110  FERRITIN 100 98   Urinalysis    Component Value Date/Time   COLORURINE YELLOW 06/17/2018 [place] 06/17/2018 1434   LABSPEC 1.025 06/17/2018 1434   PHURINE 5.5 06/17/2018 1434   GLUCOSEU NEGATIVE 06/17/2018 1434   HGBUR NEGATIVE 06/17/2018 1434   [place] 06/17/2018 1434   KETONESUR NEGATIVE 06/17/2018 1434   PROTEINUR NEGATIVE 06/17/2018 1434   UROBILINOGEN 0.2 11/19/2011 1740   NITRITE NEGATIVE 06/17/2018 1434   LEUKOCYTESUR NEGATIVE 06/17/2018 1434   Sepsis Labs Invalid input(s): PROCALCITONIN,  WBC,  LACTICIDVEN Microbiology Recent Results (from the past 240 hour(s))  SARS CORONAVIRUS 2 (TAT 6-24 HRS) Nasopharyngeal Nasopharyngeal Swab     Status: Abnormal   Collection Time: 05/10/19 11:57 AM   Specimen: Nasopharyngeal Swab  Result Value Ref Range Status   SARS Coronavirus 2 POSITIVE (A) NEGATIVE Final    Comment: RESULT CALLED TO, READ BACK BY AND VERIFIED WITH: RYLAND KENNEDY RN.@1745  ON 11.14.2020 BY TCALDWELL MT. (NOTE) SARS-CoV-2 target nucleic acids are DETECTED. The SARS-CoV-2 RNA is  generally detectable in upper and lower respiratory specimens during the acute phase of infection. Positive results are indicative of active infection with SARS-CoV-2. Clinical  correlation with patient history and other diagnostic information is necessary to determine patient infection status. Positive results do  not rule out bacterial infection or co-infection with other viruses. The expected result is Negative. Fact Sheet for Patients: SugarRoll.be Fact Sheet for Healthcare Providers: https://www.woods-mathews.com/ This test is not yet approved or cleared by the [place] FDA and  has been authorized for detection and/or diagnosis of SARS-CoV-2 by FDA under an Emergency Use Authorization (EUA). This EUA will remain  in effect (meaning this test  can be used) for the duration of the COVID-19 declaration under Section 564(b)(1) of the Act, 21 U.S.C. section 360bbb-3(b)(1), unless the authorization is terminated or revoked sooner. Performed at [facility], [place] [address], [place], [place] [address]   Blood Culture (routine x 2)     Status: None (Preliminary result)   Collection Time: 05/10/19 12:49 PM   Specimen: BLOOD  Result Value Ref Range Status   Specimen Description BLOOD SITE NOT SPECIFIED  Final   Special Requests   Final    BOTTLES DRAWN AEROBIC AND ANAEROBIC Blood Culture adequate volume   Culture   Final    NO GROWTH 4 DAYS Performed at [facility], [address], [place], [place] [address]    Report Status PENDING  Incomplete  Blood Culture (routine x 2)     Status: None (Preliminary result)   Collection Time: 05/10/19 12:50 PM   Specimen: BLOOD  Result Value Ref Range Status   Specimen Description BLOOD RIGHT ANTECUBITAL  Final   Special Requests   Final  BOTTLES DRAWN AEROBIC AND ANAEROBIC Blood Culture results may not be optimal due to an excessive volume of blood received in culture bottles    Culture   Final    NO GROWTH 4 DAYS Performed at [place] [address] [place], [place] [address]    Report Status PENDING  Incomplete     Time coordinating discharge: Over 30 minutes  SIGNED:   Little Ishikawa, DO [facility] 05/15/2019, 8:20 AM Pager   If 7PM-7AM, please contact night-coverage [email or web address] Password [email or web address]

## 2019-06-04 DIAGNOSIS — M47817 Spondylosis without myelopathy or radiculopathy, lumbosacral region: Secondary | ICD-10-CM | POA: Diagnosis not present

## 2019-06-04 DIAGNOSIS — Z79891 Long term (current) use of opiate analgesic: Secondary | ICD-10-CM | POA: Diagnosis not present

## 2019-06-04 DIAGNOSIS — M1710 Unilateral primary osteoarthritis, unspecified knee: Secondary | ICD-10-CM | POA: Diagnosis not present

## 2019-06-04 DIAGNOSIS — Z79899 Other long term (current) drug therapy: Secondary | ICD-10-CM | POA: Diagnosis not present

## 2019-06-04 DIAGNOSIS — M5137 Other intervertebral disc degeneration, lumbosacral region: Secondary | ICD-10-CM | POA: Diagnosis not present

## 2019-06-04 DIAGNOSIS — G894 Chronic pain syndrome: Secondary | ICD-10-CM | POA: Diagnosis not present

## 2019-06-09 ENCOUNTER — Ambulatory Visit: Payer: Medicare Other | Admitting: Surgery

## 2019-07-02 DIAGNOSIS — M25551 Pain in right hip: Secondary | ICD-10-CM | POA: Diagnosis not present

## 2019-07-02 DIAGNOSIS — Z79891 Long term (current) use of opiate analgesic: Secondary | ICD-10-CM | POA: Diagnosis not present

## 2019-07-02 DIAGNOSIS — G894 Chronic pain syndrome: Secondary | ICD-10-CM | POA: Diagnosis not present

## 2019-07-02 DIAGNOSIS — M533 Sacrococcygeal disorders, not elsewhere classified: Secondary | ICD-10-CM | POA: Diagnosis not present

## 2019-07-02 DIAGNOSIS — Z79899 Other long term (current) drug therapy: Secondary | ICD-10-CM | POA: Diagnosis not present

## 2019-07-07 ENCOUNTER — Ambulatory Visit: Payer: Medicare Other | Admitting: Surgery

## 2019-07-09 ENCOUNTER — Encounter: Payer: Self-pay | Admitting: Internal Medicine

## 2019-07-09 ENCOUNTER — Other Ambulatory Visit: Payer: Self-pay

## 2019-07-09 ENCOUNTER — Ambulatory Visit (INDEPENDENT_AMBULATORY_CARE_PROVIDER_SITE_OTHER): Payer: Medicare Other | Admitting: Internal Medicine

## 2019-07-09 VITALS — BP 129/94 | HR 90 | Temp 98.8°F | Wt 176.1 lb

## 2019-07-09 DIAGNOSIS — Z79891 Long term (current) use of opiate analgesic: Secondary | ICD-10-CM

## 2019-07-09 DIAGNOSIS — M069 Rheumatoid arthritis, unspecified: Secondary | ICD-10-CM

## 2019-07-09 DIAGNOSIS — M0579 Rheumatoid arthritis with rheumatoid factor of multiple sites without organ or systems involvement: Secondary | ICD-10-CM

## 2019-07-09 DIAGNOSIS — G894 Chronic pain syndrome: Secondary | ICD-10-CM | POA: Diagnosis not present

## 2019-07-09 DIAGNOSIS — Z7982 Long term (current) use of aspirin: Secondary | ICD-10-CM

## 2019-07-09 DIAGNOSIS — Z8616 Personal history of COVID-19: Secondary | ICD-10-CM

## 2019-07-09 DIAGNOSIS — U071 COVID-19: Secondary | ICD-10-CM

## 2019-07-09 DIAGNOSIS — Z95820 Peripheral vascular angioplasty status with implants and grafts: Secondary | ICD-10-CM | POA: Diagnosis not present

## 2019-07-09 DIAGNOSIS — I10 Essential (primary) hypertension: Secondary | ICD-10-CM

## 2019-07-09 DIAGNOSIS — I739 Peripheral vascular disease, unspecified: Secondary | ICD-10-CM | POA: Diagnosis not present

## 2019-07-09 DIAGNOSIS — Z79899 Other long term (current) drug therapy: Secondary | ICD-10-CM

## 2019-07-09 DIAGNOSIS — G8921 Chronic pain due to trauma: Secondary | ICD-10-CM | POA: Insufficient documentation

## 2019-07-09 DIAGNOSIS — Z981 Arthrodesis status: Secondary | ICD-10-CM

## 2019-07-09 NOTE — Assessment & Plan Note (Signed)
#  Recent coronavirus infection: Most recently hospitalized from May 09, 2021 May 15, 2019 with coronavirus infection.  He was treated with remdesivir and steroids.  He is doing well today and is completely asymptomatic.

## 2019-07-09 NOTE — Assessment & Plan Note (Signed)
#  Chronic pain syndrome: He is requesting referral for a new pain medicine clinic.  He states that for his chronic pain, he has been undergoing intra-articular lumbar injection for about 5 years and has been maintained on Percocet.  Recently, he states that he has been taking BC powder which I strongly advised against.  Plan: -New referral for pain clinic placed -Advised to discontinue BC powder given increased incidence of peptic ulcer disease and gastritis

## 2019-07-09 NOTE — Assessment & Plan Note (Signed)
#  Rheumatoid arthritis: Is been difficult finding a rheumatologist for any complaints for.  He does have a prior history of low shelves and this was complicated the situation.  I made him aware that we cannot certainly try finding a rheumatologist set up [facility] preferably in [place] and he agrees.  He does have variously transportation and states that he is eccentric the transmission in his car which would not be up and running until in about a couple months.  He states that once in a while he will have intermittent bilateral joint pain of the wrists as well as the shoulders but denies any acute exacerbation of his RA  Plan: -Continue prednisone

## 2019-07-09 NOTE — Patient Instructions (Signed)
[name] [name],   It was a pleasure seeing you in the clinic today.  I am glad you are doing well with your recent recovery from coronavirus infection.  As we talked about,  1.  Please follow-up with the vascular doctor on Monday 2.  Continue your blood pressure medications 3.  I would encourage you to stop smoking 4.  I will refer you to a new pain management clinic 5.  Regarding her rheumatoid arthritis, we will try our best to see if we can get a physician he can see outside of [place].  Take Care!  Dr. Eileen Stanford  Please call the internal medicine center clinic if you have any questions or concerns, we may be able to help and keep you from a long and expensive emergency room wait. Our clinic and after hours phone number is [phone], the best time to call is Monday through Friday 9 am to 4 pm but there is always someone available 24/7 if you have an emergency. If you need medication refills please notify your pharmacy one week in advance and they will send [place] a request.

## 2019-07-09 NOTE — Assessment & Plan Note (Signed)
#  Hypertension: Noted to have elevated BP in the clinic today.  In the past, his BP has ranged 110s-140s/70s-90s.  I would not escalate his antihypertensive today  Plan: -Continue amlodipine 10 mg daily -Continue chlorthalidone 25 mg daily

## 2019-07-09 NOTE — Progress Notes (Signed)
   CC: Follow-up hypertension, rheumatoid arthritis, chronic pain syndrome  HPI:  [name] is a 59 y.o. very pleasant gentleman with medical history listed below presenting to follow-up on chronic medical problems.  Please see problem based charting for further details.  Past Medical History:  Diagnosis Date  . Anemia   . Anemia 08/06/2012  . 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 Community acquired pneumonia 11/07/2011  . DDD (degenerative disc disease), lumbosacral     and grade 2 slip  . GERD (gastroesophageal reflux disease)   . Heart murmur   . History of anemia    no current med.  . Hypertension    states is borderline on medication; has been on med. x 5-6 yr.  . Hypokalemia 05/30/2018  . Impingement syndrome of shoulder region 10/2014   left  . Insomnia 06/01/2015  . Ischemic ulcer of toe of left foot ([place])   . Left shoulder pain 12/16/2015  . Low back pain without sciatica 10/29/2009  . Lupus ([place])   . Peripheral vascular disease ([place])   . Pneumonia   . RA (rheumatoid arthritis) ([place])   . Rotator cuff tear 11/04/2014   Review of Systems:  As per HPI  Physical Exam:  Vitals:   07/09/19 1418 07/09/19 1445  BP: (!) 154/95 (!) 129/94  Pulse: 91 90  Temp: 98.8 F (37.1 C)   TempSrc: Oral   SpO2: 99%   Weight: 176 lb 1.6 oz (79.9 kg)   Physical Exam  Constitutional: He is well-developed, well-nourished, and in no distress.  HENT:  Head: Normocephalic and atraumatic.  Cardiovascular: Normal rate, regular rhythm and normal heart sounds.  Pulmonary/Chest: Effort normal and breath sounds normal. He has no wheezes.  Musculoskeletal:        General: No tenderness, deformity or edema.     Comments: Bilateral ulnar deviation of metacarpal phalangeal joints consistent with prior diagnosis of rheumatoid arthritis  Skin: Skin is warm.  -No sign of ulcer, gangrene or skin discoloration     Assessment & Plan:   See Encounters Tab for problem based charting.  Patient  discussed with Dr. Evette Doffing

## 2019-07-09 NOTE — Assessment & Plan Note (Signed)
#  Peripheral arterial disease: He has a known history of chronic claudication.  He is status post femoropopliteal bypass of the left lower extremity and is doing well.  Today, he continues to endorse claudication.  Unfortunately he recently missed his appointment with vascular surgery which has been rescheduled to next Monday.  On physical exams, history is warm with good color, dorsalis pedis pulses can be palpated and there is no indication of critical limb ischemia.  Plan: -Continue aspirin, Lipitor -Follow-up with vascular surgery

## 2019-07-10 ENCOUNTER — Telehealth: Payer: Self-pay | Admitting: Internal Medicine

## 2019-07-10 NOTE — Telephone Encounter (Signed)
Pls contact regarding referral [phone]

## 2019-07-10 NOTE — Progress Notes (Signed)
Internal Medicine Clinic Attending  Case discussed with Dr. Agyei at the time of the visit.  We reviewed the resident's history and exam and pertinent patient test results.  I agree with the assessment, diagnosis, and plan of care documented in the resident's note.    

## 2019-07-14 ENCOUNTER — Ambulatory Visit (INDEPENDENT_AMBULATORY_CARE_PROVIDER_SITE_OTHER): Payer: Medicare Other | Admitting: Surgery

## 2019-07-14 ENCOUNTER — Encounter: Payer: Self-pay | Admitting: Surgery

## 2019-07-14 ENCOUNTER — Other Ambulatory Visit: Payer: Self-pay

## 2019-07-14 VITALS — BP 140/95 | HR 81 | Temp 98.2°F | Resp 20 | Ht 68.0 in | Wt 178.3 lb

## 2019-07-14 DIAGNOSIS — I7025 Atherosclerosis of native arteries of other extremities with ulceration: Secondary | ICD-10-CM

## 2019-07-14 NOTE — Progress Notes (Signed)
[facility]  Patient name: [name] [name] MRN: [id number] DOB: June 30, 1960 Sex: male   REASON FOR VISIT:    Follow up  HISOTRY OF PRESENT ILLNESS:    TRAJAN [name] is a 59 y.o. male who I saw on 02/19/2019 for left toe pain with skin breakdown which has been present for several weeks.  He went for arteriogram and was found to have a flush occlusion of his left superficial femoral artery with single-vessel runoff via the anterior tibial artery.  On 02/26/2019 he underwent left common femoral to below-knee popliteal artery bypass graft with PTFE.  I harvested his saphenous vein but it was ultimately not adequate.  The patient is just now being seen for his postoperative visit.  His wounds have healed.  He has no complaints at this time.  He continues to take aspirin and Plavix.  He is on a statin for hypercholesterolemia.  He is medically managed for hypertension.  He says he is stop smoking.   PAST MEDICAL HISTORY:   Past Medical History:  Diagnosis Date  . Anemia   . Anemia 08/06/2012  . 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 Community acquired pneumonia 11/07/2011  . DDD (degenerative disc disease), lumbosacral     and grade 2 slip  . GERD (gastroesophageal reflux disease)   . Heart murmur   . History of anemia    no current med.  . Hypertension    states is borderline on medication; has been on med. x 5-6 yr.  . Hypokalemia 05/30/2018  . Impingement syndrome of shoulder region 10/2014   left  . Insomnia 06/01/2015  . Ischemic ulcer of toe of left foot ([place])   . Left shoulder pain 12/16/2015  . Low back pain without sciatica 10/29/2009  . Lupus ([place])   . Peripheral vascular disease ([place])   . Pneumonia   . RA (rheumatoid arthritis) ([place])   . Rotator cuff tear 11/04/2014     FAMILY HISTORY:   Family History  Problem Relation Age of Onset  . Heart attack Father 63  . Stroke Neg Hx   . Cancer Neg Hx     SOCIAL  HISTORY:   Social History   Tobacco Use  . Smoking status: Former Smoker    Packs/day: 0.25    Years: 20.00    Pack years: 5.00    Types: Cigarettes    Quit date: 02/19/2019    Years since quitting: 0.3  . Smokeless tobacco: Never Used  Substance Use Topics  . Alcohol use: No    Alcohol/week: 0.0 standard drinks    Comment: sober 1998     ALLERGIES:   Allergies  Allergen Reactions  . Morphine And Related Rash  . Ramipril Rash    Per patient on R arm and leg only; no angioedema  . Tramadol Itching and Rash     CURRENT MEDICATIONS:   Current Outpatient Medications  Medication Sig Dispense Refill  . amLODipine (NORVASC) 10 MG tablet Take 1 tablet (10 mg total) by mouth daily. 90 tablet 1  . ascorbic acid (VITAMIN C) 500 MG tablet take 3 tablets daily    . aspirin (BAYER ASPIRIN) 325 MG tablet as directed    . atorvastatin (LIPITOR) 80 MG tablet Take 1 tablet (80 mg total) by mouth daily at 6 PM. 30 tablet 11  . chlorthalidone (HYGROTON) 25 MG tablet TAKE 1 TABLET BY MOUTH EVERY DAY (Patient taking differently: Take 25 mg by mouth daily. )  30 tablet 5  . clopidogrel (PLAVIX) 75 MG tablet Take 1 tablet (75 mg total) by mouth daily. 30 tablet 11  . diclofenac Sodium (VOLTAREN) 1 % GEL     . docusate sodium (COLACE) 100 MG capsule Take 1 capsule (100 mg total) by mouth daily. (Patient taking differently: Take 100 mg by mouth daily as needed for mild constipation. ) 10 capsule 0  . Glycerin-Polysorbate 80 (REFRESH DRY EYE THERAPY OP) Place 1 drop into both eyes 3 (three) times daily as needed (dry/irritated eyes.).    [name] Multiple Vitamin (MULTI-VITAMINS) TABS take 1 tablet by oral route  every day with food    . naloxone (NARCAN) 2 MG/2ML injection Inject 2 mg into the muscle once as needed (opiod overdose (call 911, inject intramuscularly in shoulder or thigh. Repeat every 3 minutes)).     [name] oxyCODONE-acetaminophen (PERCOCET) 10-325 MG tablet Take 1 tablet by mouth See admin  instructions. Five times daily as needed for pain.    . pantoprazole (PROTONIX) 40 MG tablet Take 1 tablet (40 mg total) by mouth daily. 30 tablet 2  . polyethylene glycol (MIRALAX / GLYCOLAX) 17 g packet Take 17 g by mouth daily as needed for mild constipation. 14 each 0  . predniSONE (DELTASONE) 5 MG tablet Take 5 mg by mouth every morning.    . nicotine (NICODERM CQ - DOSED IN MG/24 HOURS) 21 mg/24hr patch Place 1 patch (21 mg total) onto the skin daily. (Patient not taking: Reported on 05/10/2019) 28 patch 0   No current facility-administered medications for this visit.    REVIEW OF SYSTEMS:   [X]  denotes positive finding, [ ]  denotes negative finding Cardiac  Comments:  Chest pain or chest pressure:    Shortness of breath upon exertion:    Short of breath when lying flat:    Irregular heart rhythm:        Vascular    Pain in calf, thigh, or hip brought on by ambulation:    Pain in feet at night that wakes you up from your sleep:     Blood clot in your veins:    Leg swelling:         Pulmonary    Oxygen at home:    Productive cough:     Wheezing:         Neurologic    Sudden weakness in arms or legs:     Sudden numbness in arms or legs:     Sudden onset of difficulty speaking or slurred speech:    Temporary loss of vision in one eye:     Problems with dizziness:         Gastrointestinal    Blood in stool:     Vomited blood:         Genitourinary    Burning when urinating:     Blood in urine:        Psychiatric    Major depression:         Hematologic    Bleeding problems:    Problems with blood clotting too easily:        Skin    Rashes or ulcers:        Constitutional    Fever or chills:      PHYSICAL EXAM:   Vitals:   07/14/19 1209  BP: (!) 140/95  Pulse: 81  Resp: 20  Temp: 98.2 F (36.8 C)  SpO2: 97%  Weight: 178 lb 4.8 oz (80.9 kg)  Height: 5\' 8"  (1.727 m)    GENERAL: The patient is a well-nourished male, in no acute distress. The vital  signs are documented above. CARDIAC: There is a regular rate and rhythm.  VASCULAR: Brisk anterior tibial Doppler signal PULMONARY: Non-labored respirations ABDOMEN: Soft and non-tender with normal pitched bowel sounds.  MUSCULOSKELETAL: There are no major deformities or cyanosis. NEUROLOGIC: No focal weakness or paresthesias are detected. SKIN: There are no ulcers or rashes noted. PSYCHIATRIC: The patient has a normal affect.  STUDIES:   Postoperative ABIs: +-------+-----------+-----------+------------+------------+ ABI/TBIToday's ABIToday's TBIPrevious ABIPrevious TBI +-------+-----------+-----------+------------+------------+ Right  1.13       0.55                                +-------+-----------+-----------+------------+------------+ Left   0.97       0.62                                +-------+-----------+-----------+------------+------------+  MEDICAL ISSUES:   PAD with ulcer: The patient has healed his wound on his left great toe.  His bypass graft today appears to be widely patent.  He will continue with dual antiplatelet therapy.  Have him schedule for follow-up ultrasound of his bypass graft in 3 months.    Leia Alf, MD, FACS [facility] [phone] Pager [phone]

## 2019-07-16 ENCOUNTER — Other Ambulatory Visit: Payer: Self-pay | Admitting: *Deleted

## 2019-07-16 DIAGNOSIS — I7025 Atherosclerosis of native arteries of other extremities with ulceration: Secondary | ICD-10-CM

## 2019-07-27 ENCOUNTER — Other Ambulatory Visit: Payer: Self-pay | Admitting: Internal Medicine

## 2019-07-28 ENCOUNTER — Encounter: Payer: Self-pay | Admitting: *Deleted

## 2019-07-28 NOTE — Progress Notes (Unsigned)

## 2019-07-29 NOTE — Progress Notes (Unsigned)
Things That May Be Affecting Your Health:  Alcohol  Hearing loss  Pain    Depression  Home Safety  Sexual Health   Diabetes  Lack of physical activity  Stress   Difficulty with daily activities  Loneliness  Tiredness   Drug use  Medicines  Tobacco use   Falls  Motor Vehicle Safety  Weight   Food choices  Oral Health  Other    YOUR PERSONALIZED HEALTH PLAN : 1. Schedule your next subsequent Medicare Wellness visit in one year 2. Attend all of your regular appointments to address your medical issues 3. Complete the preventative screenings and services   Annual Wellness Visit   Medicare Covered Preventative Screenings and [place] Men and Women Who How Often Need? Date of Last Service Action  Abdominal Aortic Aneurysm Adults with AAA risk factors Once Y    Alcohol Misuse and Counseling All Adults Screening once a year if no alcohol misuse. Counseling up to 4 face to face sessions.     Bone Density Measurement  Adults at risk for osteoporosis Once every 2 yrs     Lipid Panel Z13.6 All adults without CV disease Once every 5 yrs     Colorectal Cancer   Stool sample or  Colonoscopy All adults 64 and older   Once every year  Every 10 years     Depression All Adults Once a year Y Today   Diabetes Screening Blood glucose, post glucose load, or GTT Z13.1  All adults at risk  Pre-diabetics  Once per year  Twice per year     Diabetes  Self-Management Training All adults Diabetics 10 hrs first year; 2 hours subsequent years. Requires Copay     Glaucoma  Diabetics  Family history of glaucoma  African Americans 15 yrs +  Hispanic Americans 101 yrs + Annually - requires coppay     Hepatitis C Z72.89 or F19.20  High Risk for HCV  Born between 1945 and 1965  Annually  Once     HIV Z11.4 All adults based on risk  Annually btw ages 59 & 40 regardless of risk  Annually > 65 yrs if at increased risk     Lung Cancer Screening Asymptomatic adults aged  37-77 with 30 pack yr history and current smoker OR quit within the last 15 yrs Annually Must have counseling and shared decision making documentation before first screen Y    Medical Nutrition Therapy Adults with   Diabetes  Renal disease  Kidney transplant within past 3 yrs 3 hours first year; 2 hours subsequent years Y    Obesity and Counseling All adults Screening once a year Counseling if BMI 30 or higher  Today   Tobacco Use Counseling Adults who use tobacco  Up to 8 visits in one year     Vaccines Z23  Hepatitis B  Influenza   Pneumonia  Adults   Once  Once every flu season  Two different vaccines separated by one year     Next Annual Wellness Visit People with Medicare Every year  Today     Services & Screenings Women Who How Often Need  Date of Last Service Action  Mammogram  Z12.31 Women over 41 One baseline ages 30-39. Annually ager 40 yrs+     Pap tests All women Annually if high risk. Every 2 yrs for normal risk women     Screening for cervical cancer with   Pap (Z01.419 nl or Z01.411abnl) &  HPV Z11.51 Women aged 11 to 88 Once every 5 yrs     Screening pelvic and breast exams All women Annually if high risk. Every 2 yrs for normal risk women     Sexually Transmitted Diseases  Chlamydia  Gonorrhea  Syphilis All at risk adults Annually for non pregnant females at increased risk         [place] Men Who How Ofter Need  Date of Last Service Action  Prostate Cancer - DRE & PSA Men over 50 Annually.  DRE might require a copay.     Sexually Transmitted Diseases  Syphilis All at risk adults Annually for men at increased risk

## 2019-07-30 ENCOUNTER — Other Ambulatory Visit: Payer: Self-pay | Admitting: Internal Medicine

## 2019-07-30 DIAGNOSIS — M0579 Rheumatoid arthritis with rheumatoid factor of multiple sites without organ or systems involvement: Secondary | ICD-10-CM

## 2019-08-05 DIAGNOSIS — F17210 Nicotine dependence, cigarettes, uncomplicated: Secondary | ICD-10-CM | POA: Diagnosis not present

## 2019-08-05 DIAGNOSIS — I739 Peripheral vascular disease, unspecified: Secondary | ICD-10-CM | POA: Diagnosis not present

## 2019-08-05 DIAGNOSIS — M5136 Other intervertebral disc degeneration, lumbar region: Secondary | ICD-10-CM | POA: Diagnosis not present

## 2019-08-05 DIAGNOSIS — R011 Cardiac murmur, unspecified: Secondary | ICD-10-CM | POA: Diagnosis not present

## 2019-08-05 DIAGNOSIS — M48062 Spinal stenosis, lumbar region with neurogenic claudication: Secondary | ICD-10-CM | POA: Diagnosis not present

## 2019-08-05 DIAGNOSIS — R609 Edema, unspecified: Secondary | ICD-10-CM | POA: Diagnosis not present

## 2019-08-05 DIAGNOSIS — I70213 Atherosclerosis of native arteries of extremities with intermittent claudication, bilateral legs: Secondary | ICD-10-CM | POA: Diagnosis not present

## 2019-08-05 DIAGNOSIS — I70209 Unspecified atherosclerosis of native arteries of extremities, unspecified extremity: Secondary | ICD-10-CM | POA: Diagnosis not present

## 2019-08-05 DIAGNOSIS — G894 Chronic pain syndrome: Secondary | ICD-10-CM | POA: Diagnosis not present

## 2019-08-19 DIAGNOSIS — M5136 Other intervertebral disc degeneration, lumbar region: Secondary | ICD-10-CM | POA: Diagnosis not present

## 2019-08-19 DIAGNOSIS — Z5181 Encounter for therapeutic drug level monitoring: Secondary | ICD-10-CM | POA: Diagnosis not present

## 2019-08-19 DIAGNOSIS — Z79891 Long term (current) use of opiate analgesic: Secondary | ICD-10-CM | POA: Diagnosis not present

## 2019-08-19 DIAGNOSIS — G894 Chronic pain syndrome: Secondary | ICD-10-CM | POA: Diagnosis not present

## 2019-08-21 ENCOUNTER — Encounter: Payer: Self-pay | Admitting: *Deleted

## 2019-08-21 NOTE — Progress Notes (Unsigned)

## 2019-09-04 ENCOUNTER — Other Ambulatory Visit: Payer: Self-pay

## 2019-09-04 ENCOUNTER — Ambulatory Visit: Payer: Medicare Other | Admitting: Internal Medicine

## 2019-09-04 ENCOUNTER — Encounter: Payer: Medicaid Other | Admitting: Internal Medicine

## 2019-09-08 ENCOUNTER — Other Ambulatory Visit: Payer: Self-pay | Admitting: *Deleted

## 2019-09-08 DIAGNOSIS — I10 Essential (primary) hypertension: Secondary | ICD-10-CM

## 2019-09-08 MED ORDER — CHLORTHALIDONE 25 MG PO TABS: 25.0000 mg | ORAL_TABLET | Freq: Every day | ORAL | 5 refills | Status: DC

## 2019-09-25 ENCOUNTER — Other Ambulatory Visit: Payer: Self-pay | Admitting: Internal Medicine

## 2019-09-25 DIAGNOSIS — I10 Essential (primary) hypertension: Secondary | ICD-10-CM

## 2019-10-01 DIAGNOSIS — M5136 Other intervertebral disc degeneration, lumbar region: Secondary | ICD-10-CM | POA: Diagnosis not present

## 2019-10-01 DIAGNOSIS — G894 Chronic pain syndrome: Secondary | ICD-10-CM | POA: Diagnosis not present

## 2019-10-01 DIAGNOSIS — M4326 Fusion of spine, lumbar region: Secondary | ICD-10-CM | POA: Diagnosis not present

## 2019-10-01 DIAGNOSIS — M069 Rheumatoid arthritis, unspecified: Secondary | ICD-10-CM | POA: Diagnosis not present

## 2019-10-10 ENCOUNTER — Telehealth (HOSPITAL_COMMUNITY): Payer: Self-pay

## 2019-10-10 NOTE — Telephone Encounter (Signed)

## 2019-10-13 ENCOUNTER — Encounter (HOSPITAL_COMMUNITY): Payer: Medicare Other

## 2019-10-13 ENCOUNTER — Other Ambulatory Visit (HOSPITAL_COMMUNITY): Payer: Medicare Other

## 2019-10-13 ENCOUNTER — Ambulatory Visit: Payer: Medicare Other

## 2019-10-29 DIAGNOSIS — M5136 Other intervertebral disc degeneration, lumbar region: Secondary | ICD-10-CM | POA: Diagnosis not present

## 2019-10-29 DIAGNOSIS — M069 Rheumatoid arthritis, unspecified: Secondary | ICD-10-CM | POA: Diagnosis not present

## 2019-10-29 DIAGNOSIS — G894 Chronic pain syndrome: Secondary | ICD-10-CM | POA: Diagnosis not present

## 2019-10-29 DIAGNOSIS — Z79891 Long term (current) use of opiate analgesic: Secondary | ICD-10-CM | POA: Diagnosis not present

## 2019-11-05 ENCOUNTER — Ambulatory Visit (INDEPENDENT_AMBULATORY_CARE_PROVIDER_SITE_OTHER): Payer: Medicare Other | Admitting: Internal Medicine

## 2019-11-05 ENCOUNTER — Encounter: Payer: Self-pay | Admitting: Internal Medicine

## 2019-11-05 ENCOUNTER — Other Ambulatory Visit: Payer: Self-pay

## 2019-11-05 VITALS — BP 134/80

## 2019-11-05 DIAGNOSIS — Z0000 Encounter for general adult medical examination without abnormal findings: Secondary | ICD-10-CM

## 2019-11-05 NOTE — Progress Notes (Signed)
I discussed the AWV findings with the RN who conducted the visit. I was present in the office suite and immediately available to provide assistance and direction throughout the time the service was provided.   

## 2019-11-05 NOTE — Patient Instructions (Addendum)
Annual Wellness Visit   Medicare Covered Preventative Screenings and Services  Services & Screenings Men and Women Who How Often Need? Date of Last Service Action  Abdominal Aortic Aneurysm Adults with AAA risk factors Once Yes  Discuss with Dr. Eileen Stanford at next Office Visit  Alcohol Misuse and Counseling All Adults Screening once a year if no alcohol misuse. Counseling up to 4 face to face sessions.     Bone Density Measurement  Adults at risk for osteoporosis Once every 2 yrs     Lipid Panel Z13.6 All adults without CV disease Once every 5 yrs     Colorectal Cancer   Stool sample or  Colonoscopy All adults 51 and older   Once every year  Every 10 years     Depression All Adults Once a year  Today   Diabetes Screening Blood glucose, post glucose load, or GTT Z13.1  All adults at risk  Pre-diabetics  Once per year  Twice per year     Diabetes  Self-Management Training All adults Diabetics 10 hrs first year; 2 hours subsequent years. Requires Copay     Glaucoma  Diabetics  Family history of glaucoma  African Americans 7 yrs +  Hispanic Americans 76 yrs + Annually - requires coppay     Hepatitis C Z72.89 or F19.20  High Risk for HCV  Born between 1945 and 1965  Annually  Once     HIV Z11.4 All adults based on risk  Annually btw ages 26 & 46 regardless of risk  Annually > 65 yrs if at increased risk     Lung Cancer Screening Asymptomatic adults aged 61-77 with 30 pack yr history and current smoker OR quit within the last 15 yrs Annually Must have counseling and shared decision making documentation before first screen Yes  Discuss with Dr. Eileen Stanford at next Office Visit  [place] with   Diabetes  Renal disease  Kidney transplant within past 3 yrs 3 hours first year; 2 hours subsequent years     Obesity and Counseling All adults Screening once a year Counseling if BMI 30 or higher  Today   Tobacco Use Counseling Adults who use tobacco  Up to  8 visits in one year Yes  Call 1-800-QUIT-NOW for help with quitting smoking  Vaccines Z23  Hepatitis B  Influenza   Pneumonia  Adults   Once  Once every flu season  Two different vaccines separated by one year    Yearly Flu vaccine starting September 1  Covid vaccines  Next Annual Wellness Visit People with Medicare Every year  Today     Services & Screenings Women Who How Often Need  Date of Last Service Action  Mammogram  Z12.31 Women over 48 One baseline ages 6-39. Annually ager 40 yrs+     Pap tests All women Annually if high risk. Every 2 yrs for normal risk women     Screening for cervical cancer with   Pap (Z01.419 nl or Z01.411abnl) &  HPV Z11.51 Women aged 63 to 45 Once every 5 yrs     Screening pelvic and breast exams All women Annually if high risk. Every 2 yrs for normal risk women     Sexually Transmitted Diseases  Chlamydia  Gonorrhea  Syphilis All at risk adults Annually for non pregnant females at increased risk         [place] Men Who How Ofter Need  Date of Last Service Action  Prostate  Cancer - DRE & PSA Men over 50 Annually.  DRE might require a copay.     Sexually Transmitted Diseases  Syphilis All at risk adults Annually for men at increased risk         Things That May Be Affecting Your Health:  Alcohol  Hearing loss X Pain    Depression  Home Safety  Sexual Health   Diabetes  Lack of physical activity X Stress   Difficulty with daily activities  Loneliness  Tiredness   Drug use  Medicines  Tobacco use   Falls  Motor Vehicle Safety  Weight   Food choices  Oral Health  Other    YOUR PERSONALIZED HEALTH PLAN : 1. Schedule your next subsequent Medicare Wellness visit in one year 2. Attend all of your regular appointments to address your medical issues 3. Complete the preventative screenings and services 4. Call 1-800-QUIT-NOW for help with quitting smoking 5. Consider speaking with Dessie Coma at [facility] for help with strategies to reduce worrying 6. Begin seated and standing exercises with exercise band to increase strength and balance.    Fall Prevention in the Home, Adult Falls can cause injuries. They can happen to people of all ages. There are many things you can do to make your home safe and to help prevent falls. Ask for help when making these changes, if needed. What actions can I take to prevent falls? General Instructions  Use good lighting in all rooms. Replace any light bulbs that burn out.  Turn on the lights when you go into a dark area. Use night-lights.  Keep items that you use often in easy-to-reach places. Lower the shelves around your home if necessary.  Set up your furniture so you have a clear path. Avoid moving your furniture around.  Do not have throw rugs and other things on the floor that can make you trip.  Avoid walking on wet floors.  If any of your floors are uneven, fix them.  Add color or contrast paint or tape to clearly mark and help you see: ? Any grab bars or handrails. ? First and last steps of stairways. ? Where the edge of each step is.  If you use a stepladder: ? Make sure that it is fully opened. Do not climb a closed stepladder. ? Make sure that both sides of the stepladder are locked into place. ? Ask someone to hold the stepladder for you while you use it.  If there are any pets around you, be aware of where they are. What can I do in the bathroom?      Keep the floor dry. Clean up any water that spills onto the floor as soon as it happens.  Remove soap buildup in the tub or shower regularly.  Use non-skid mats or decals on the floor of the tub or shower.  Attach bath mats securely with double-sided, non-slip rug tape.  If you need to sit down in the shower, use a plastic, non-slip stool.  Install grab bars by the toilet and in the tub and shower. Do not use towel bars as grab bars. What can I do in the  bedroom?  Make sure that you have a light by your bed that is easy to reach.  Do not use any sheets or blankets that are too big for your bed. They should not hang down onto the floor.  Have a firm chair that has side arms. You can use this for  support while you get dressed. What can I do in the kitchen?  Clean up any spills right away.  If you need to reach something above you, use a strong step stool that has a grab bar.  Keep electrical cords out of the way.  Do not use floor polish or wax that makes floors slippery. If you must use wax, use non-skid floor wax. What can I do with my stairs?  Do not leave any items on the stairs.  Make sure that you have a light switch at the top of the stairs and the bottom of the stairs. If you do not have them, ask someone to add them for you.  Make sure that there are handrails on both sides of the stairs, and use them. Fix handrails that are broken or loose. Make sure that handrails are as long as the stairways.  Install non-slip stair treads on all stairs in your home.  Avoid having throw rugs at the top or bottom of the stairs. If you do have throw rugs, attach them to the floor with carpet tape.  Choose a carpet that does not hide the edge of the steps on the stairway.  Check any carpeting to make sure that it is firmly attached to the stairs. Fix any carpet that is loose or worn. What can I do on the outside of my home?  Use bright outdoor lighting.  Regularly fix the edges of walkways and driveways and fix any cracks.  Remove anything that might make you trip as you walk through a door, such as a raised step or threshold.  Trim any bushes or trees on the path to your home.  Regularly check to see if handrails are loose or broken. Make sure that both sides of any steps have handrails.  Install guardrails along the edges of any raised decks and porches.  Clear walking paths of anything that might make someone trip, such as tools  or rocks.  Have any leaves, snow, or ice cleared regularly.  Use sand or salt on walking paths during winter.  Clean up any spills in your garage right away. This includes grease or oil spills. What other actions can I take?  Wear shoes that: ? Have a low heel. Do not wear high heels. ? Have rubber bottoms. ? Are comfortable and fit you well. ? Are closed at the toe. Do not wear open-toe sandals.  Use tools that help you move around (mobility aids) if they are needed. These include: ? Canes. ? Walkers. ? Scooters. ? Crutches.  Review your medicines with your doctor. Some medicines can make you feel dizzy. This can increase your chance of falling. Ask your doctor what other things you can do to help prevent falls. Where to find more information  Centers for Disease Control and Prevention, STEADI: https://garcia.biz/  [employer] on Aging: BrainJudge.co.uk Contact a doctor if:  You are afraid of falling at home.  You feel weak, drowsy, or dizzy at home.  You fall at home. Summary  There are many simple things that you can do to make your home safe and to help prevent falls.  Ways to make your home safe include removing tripping hazards and installing grab bars in the bathroom.  Ask for help when making these changes in your home. This information is not intended to replace advice given to you by your health care provider. Make sure you discuss any questions you have with your health care provider. Document Revised:  10/03/2018 Document Reviewed: 01/25/2017 Elsevier Patient Education  [name] [name], Male Adopting a healthy lifestyle and getting preventive care are important in promoting health and wellness. Ask your health care provider about:  The right schedule for you to have regular tests and exams.  Things you can do on your own to prevent diseases and keep yourself healthy. What should I know about diet, weight, and  exercise? Eat a healthy diet   Eat a diet that includes plenty of vegetables, fruits, low-fat dairy products, and lean protein.  Do not eat a lot of foods that are high in solid fats, added sugars, or sodium. Maintain a healthy weight Body mass index (BMI) is a measurement that can be used to identify possible weight problems. It estimates body fat based on height and weight. Your health care provider can help determine your BMI and help you achieve or maintain a healthy weight. Get regular exercise Get regular exercise. This is one of the most important things you can do for your health. Most adults should:  Exercise for at least 150 minutes each week. The exercise should increase your heart rate and make you sweat (moderate-intensity exercise).  Do strengthening exercises at least twice a week. This is in addition to the moderate-intensity exercise.  Spend less time sitting. Even light physical activity can be beneficial. Watch cholesterol and blood lipids Have your blood tested for lipids and cholesterol at 60 years of age, then have this test every 5 years. You may need to have your cholesterol levels checked more often if:  Your lipid or cholesterol levels are high.  You are older than 60 years of age.  You are at high risk for heart disease. What should I know about cancer screening? Many types of cancers can be detected early and may often be prevented. Depending on your health history and family history, you may need to have cancer screening at various ages. This may include screening for:  Colorectal cancer.  Prostate cancer.  Skin cancer.  Lung cancer. What should I know about heart disease, diabetes, and high blood pressure? Blood pressure and heart disease  High blood pressure causes heart disease and increases the risk of stroke. This is more likely to develop in people who have high blood pressure readings, are of African descent, or are overweight.  Talk with  your health care provider about your target blood pressure readings.  Have your blood pressure checked: ? Every 3-5 years if you are 64-62 years of age. ? Every year if you are 60 years old or older.  If you are between the ages of 76 and 34 and are a current or former smoker, ask your health care provider if you should have a one-time screening for abdominal aortic aneurysm (AAA). Diabetes Have regular diabetes screenings. This checks your fasting blood sugar level. Have the screening done:  Once every three years after age 75 if you are at a normal weight and have a low risk for diabetes.  More often and at a younger age if you are overweight or have a high risk for diabetes. What should I know about preventing infection? Hepatitis B If you have a higher risk for hepatitis B, you should be screened for this virus. Talk with your health care provider to find out if you are at risk for hepatitis B infection. Hepatitis C Blood testing is recommended for:  Everyone born from [birth year] through 1966.  Anyone with known risk factors  for hepatitis C. Sexually transmitted infections (STIs)  You should be screened each year for STIs, including gonorrhea and chlamydia, if: ? You are sexually active and are younger than 59 years of age. ? You are older than 60 years of age and your health care provider tells you that you are at risk for this type of infection. ? Your sexual activity has changed since you were last screened, and you are at increased risk for chlamydia or gonorrhea. Ask your health care provider if you are at risk.  Ask your health care provider about whether you are at high risk for HIV. Your health care provider may recommend a prescription medicine to help prevent HIV infection. If you choose to take medicine to prevent HIV, you should first get tested for HIV. You should then be tested every 3 months for as long as you are taking the medicine. Follow these instructions at  home: Lifestyle  Do not use any products that contain nicotine or tobacco, such as cigarettes, e-cigarettes, and chewing tobacco. If you need help quitting, ask your health care provider.  Do not use street drugs.  Do not share needles.  Ask your health care provider for help if you need support or information about quitting drugs. Alcohol use  Do not drink alcohol if your health care provider tells you not to drink.  If you drink alcohol: ? Limit how much you have to 0-2 drinks a day. ? Be aware of how much alcohol is in your drink. In the [place] one drink equals one 12 oz bottle of beer (355 mL), one 5 oz glass of wine (148 mL), or one 1 oz glass of hard liquor (44 mL). General instructions  Schedule regular health, dental, and eye exams.  Stay current with your vaccines.  Tell your health care provider if: ? You often feel depressed. ? You have ever been abused or do not feel safe at home. Summary  Adopting a healthy lifestyle and getting preventive care are important in promoting health and wellness.  Follow your health care provider's instructions about healthy diet, exercising, and getting tested or screened for diseases.  Follow your health care provider's instructions on monitoring your cholesterol and blood pressure. This information is not intended to replace advice given to you by your health care provider. Make sure you discuss any questions you have with your health care provider. Document Revised: 06/05/2018 Document Reviewed: 06/05/2018 Elsevier Patient Education  2020 [employer].

## 2019-11-05 NOTE — Progress Notes (Signed)
This AWV is being conducted by [facility] only. The patient was located at home and I was located in [facility]. The patient's identity was confirmed using their DOB and current address. The patient or his/her legal guardian has consented to being evaluated through a telephone encounter and understands the associated risks (an examination cannot be done and the patient may need to come in for an appointment) / benefits (allows the patient to remain at home, decreasing exposure to coronavirus). I personally spent 33 minutes conducting the AWV.  Subjective:   [name] [name] is a 60 y.o. male who presents for a Medicare Annual Wellness Visit.  The following items have been reviewed and updated today in the appropriate area in the EMR.   Health Risk Assessment  Height, weight, BMI, and BP Visual acuity if needed Depression screen Fall risk / safety level Advance directive discussion Medical and family history were reviewed and updated Updating list of other providers & suppliers Medication reconciliation, including over the counter medicines Cognitive screen Written screening schedule Risk Factor list Personalized health advice, risky behaviors, and treatment advice  Social History   Social History Narrative   On disability since 1992, used to work with city of [place]. Quit drinking in 1990.      Current Social History 11/05/2019        Patient lives by himself most of the time. Sometimes girlfriend's son stays with him in a one level home. There are 3 steps with handrail up to the entrance the patient uses.       Patient's method of transportation is personal truck. This was recently vandalized and patient doesn't have funds to repair at this time.      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 N N  Vision? Y N  Comment Bilat cataracts -  Difficulty concentrating or making decisions? N N  Walking or climbing stairs? Y N  Comment "Just a little bit" -  Dressing or bathing? N N  Doing errands, shopping? N N  Some recent data might be hidden    Goals Goals     Quit smoking / using tobacco     Walk daily 30-60 minutes each time (pt-stated)       Fall Risk Fall Risk  11/05/2019 07/09/2019 02/19/2019 05/30/2018 12/21/2017  Falls in the past year? 0 1 0 0 Yes  Number falls in past yr: - 0 0 - 1  Injury with Fall? - 0 - - No  Risk for fall due to : Other (Comment) - - - History of fall(s);Impaired balance/gait;Impaired mobility  Risk for fall due to: Comment RA - - - -  Follow up Education provided;Falls prevention discussed Falls evaluation completed - - -   CDC Handout on Fall Prevention and Handout on Home Exercise Program, Access codes [id number] and [id number] mailed to patient with exercise band.   Depression Screen PHQ 2/9 Scores 11/05/2019 07/09/2019 12/21/2017 07/10/2017  PHQ - 2 Score 1 0 0 0  PHQ- 9 Score 4 3 - -     Cognitive Testing Six-Item Cognitive Screener   "I would like to ask you some questions that ask you to use your memory. I am going to name three objects. Please wait until I say all three words, then repeat them. Remember what they are  because I am going to ask you to name them again in a few minutes. Please  repeat these words for me: APPLE--TABLE--PENNY. (Interviewer may repeat names 3 times if necessary but repetition not scored.)  Did patient correctly repeat all three words? Yes - may proceed with screen  What year is this? Correct What month is this? Correct What day of the week is this? Correct  What were the three objects I asked you to remember?  Apple Correct  Table Correct  Penny Correct  Score one point for each incorrect answer.  A score of 2 or more points warrants additional investigation.  Patient's score 0  OUDScreen In the past 3 months did you use your opioid medicines for other purposes, for example to help you sleep or to help with stress or worry? Not al all (0 points)     In the past 3 months did opioid medicines cause you to feel slowed down, sluggish, or sedated? Not al all (0 points)     In the past 3 months did opioid medicines cause you to lose interest in your usual activities? Not al all (0 points)     In the past 3 months did you worry about your use of opioid medicines? Not al all (0 points)     A score of 3 or more is concerning for moderate to severe OUD and requires consultation with a physician.  Patient score = 0   Assessment and Plan:     Patient has been out of Atorvastatin and Plavix since 04/2019. Will send refill request to PCP. Patient will consider calling [facility] for help with quitting smoking He will consider speaking with Miquel Dunn for help with strategies to reduce worrying He will begin seated and standing exercises with exercise band to increase strength and balance.      During the course of the visit the patient was educated and counseled about appropriate screening and preventive services as documented in the assessment and plan.  The printed AVS was given to the patient and included an updated screening schedule, a list of risk factors, and personalized health advice.        Velora Heckler, RN  11/05/2019

## 2019-11-06 ENCOUNTER — Telehealth: Payer: Self-pay | Admitting: Internal Medicine

## 2019-11-06 DIAGNOSIS — I739 Peripheral vascular disease, unspecified: Secondary | ICD-10-CM

## 2019-11-06 MED ORDER — CLOPIDOGREL BISULFATE 75 MG PO TABS: 75.0000 mg | ORAL_TABLET | Freq: Every day | ORAL | 11 refills | Status: AC

## 2019-11-06 MED ORDER — ATORVASTATIN CALCIUM 80 MG PO TABS: 80.0000 mg | ORAL_TABLET | Freq: Every day | ORAL | 11 refills | Status: DC

## 2019-11-06 NOTE — Progress Notes (Signed)
Internal Medicine Clinic Attending  I have reviewed the visit with Dr. Maricela Bo.  We reviewed the AWV findings.  I agree with the assessment, diagnosis, and plan of care documented in the AWV note.    I called and spoke with the patient about his medications and refilled atorvastatin and clopidogrel. Please see telephone encounter for additional details.  Lenice Pressman, MD, PhD

## 2019-11-06 NOTE — Telephone Encounter (Signed)
Called to follow up on his AWV yesterday. He had mentioned then he was out of his clopidogrel and atorvastatin, which he confirmed for me again today. He said he has all the rest of his medicines and is taking them ok.  We also discussed his missed follow up appt with vascular surgery last month. He will call today to reschedule that appt. He said his foot is doing well with no recurrent ulcers or wounds, but he does sometimes get numbness or parasthesias in that foot.   I will refill his clopidogrel and atorvastatin today.

## 2019-11-27 DIAGNOSIS — G894 Chronic pain syndrome: Secondary | ICD-10-CM | POA: Diagnosis not present

## 2019-11-27 DIAGNOSIS — M5136 Other intervertebral disc degeneration, lumbar region: Secondary | ICD-10-CM | POA: Diagnosis not present

## 2019-12-19 ENCOUNTER — Encounter: Payer: Self-pay | Admitting: Surgery

## 2019-12-30 ENCOUNTER — Other Ambulatory Visit: Payer: Self-pay | Admitting: *Deleted

## 2019-12-30 ENCOUNTER — Other Ambulatory Visit: Payer: Self-pay | Admitting: Internal Medicine

## 2019-12-30 DIAGNOSIS — M0579 Rheumatoid arthritis with rheumatoid factor of multiple sites without organ or systems involvement: Secondary | ICD-10-CM

## 2019-12-30 MED ORDER — PREDNISONE 5 MG PO TABS: ORAL_TABLET | ORAL | 0 refills | Status: DC

## 2019-12-31 DIAGNOSIS — G894 Chronic pain syndrome: Secondary | ICD-10-CM | POA: Diagnosis not present

## 2019-12-31 DIAGNOSIS — R5382 Chronic fatigue, unspecified: Secondary | ICD-10-CM | POA: Diagnosis not present

## 2019-12-31 DIAGNOSIS — I119 Hypertensive heart disease without heart failure: Secondary | ICD-10-CM | POA: Diagnosis not present

## 2019-12-31 DIAGNOSIS — Z79891 Long term (current) use of opiate analgesic: Secondary | ICD-10-CM | POA: Diagnosis not present

## 2019-12-31 DIAGNOSIS — I739 Peripheral vascular disease, unspecified: Secondary | ICD-10-CM | POA: Diagnosis not present

## 2019-12-31 DIAGNOSIS — F17210 Nicotine dependence, cigarettes, uncomplicated: Secondary | ICD-10-CM | POA: Diagnosis not present

## 2019-12-31 DIAGNOSIS — R011 Cardiac murmur, unspecified: Secondary | ICD-10-CM | POA: Diagnosis not present

## 2019-12-31 DIAGNOSIS — M25559 Pain in unspecified hip: Secondary | ICD-10-CM | POA: Diagnosis not present

## 2019-12-31 DIAGNOSIS — M47897 Other spondylosis, lumbosacral region: Secondary | ICD-10-CM | POA: Diagnosis not present

## 2019-12-31 DIAGNOSIS — M5136 Other intervertebral disc degeneration, lumbar region: Secondary | ICD-10-CM | POA: Diagnosis not present

## 2019-12-31 DIAGNOSIS — R609 Edema, unspecified: Secondary | ICD-10-CM | POA: Diagnosis not present

## 2020-01-01 ENCOUNTER — Other Ambulatory Visit: Payer: Self-pay | Admitting: Internal Medicine

## 2020-01-01 NOTE — Telephone Encounter (Signed)
Need refill on a pill that keep the swelling down  ;pt contact [phone]   [id number] - [place] [place] [address] contact pt if any questions [phone]

## 2020-01-01 NOTE — Telephone Encounter (Signed)
Informed pt's sig other no meds will be filled til appt, offered an appt sooner than next week, she states he will be ok. Call ended

## 2020-01-07 ENCOUNTER — Encounter: Payer: Medicare Other | Admitting: Internal Medicine

## 2020-01-08 ENCOUNTER — Encounter: Payer: Self-pay | Admitting: Internal Medicine

## 2020-01-08 ENCOUNTER — Ambulatory Visit (INDEPENDENT_AMBULATORY_CARE_PROVIDER_SITE_OTHER): Payer: Medicare Other | Admitting: Internal Medicine

## 2020-01-08 VITALS — BP 139/85 | HR 84 | Temp 97.6°F | Ht 67.5 in | Wt 175.5 lb

## 2020-01-08 DIAGNOSIS — I10 Essential (primary) hypertension: Secondary | ICD-10-CM

## 2020-01-08 DIAGNOSIS — I70229 Atherosclerosis of native arteries of extremities with rest pain, unspecified extremity: Secondary | ICD-10-CM

## 2020-01-08 DIAGNOSIS — I739 Peripheral vascular disease, unspecified: Secondary | ICD-10-CM | POA: Diagnosis not present

## 2020-01-08 MED ORDER — VALSARTAN 40 MG PO TABS: 40.0000 mg | ORAL_TABLET | Freq: Every day | ORAL | 11 refills | Status: DC

## 2020-01-08 MED ORDER — CHLORTHALIDONE 25 MG PO TABS: 25.0000 mg | ORAL_TABLET | Freq: Every day | ORAL | 5 refills | Status: DC

## 2020-01-08 NOTE — Progress Notes (Signed)
   CC: hypertension f/u  HPI:  [name] [name] is a 59 y.o. male with PMHx as listed below presenting for f/u of hypertension. No acute concerns today. Please see problem based charting for complete assessment and plan.  Past Medical History:  Diagnosis Date  . Anemia   . Anemia 08/06/2012  . 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 Community acquired pneumonia 11/07/2011  . DDD (degenerative disc disease), lumbosacral     and grade 2 slip  . GERD (gastroesophageal reflux disease)   . Heart murmur   . History of anemia    no current med.  . Hypertension    states is borderline on medication; has been on med. x 5-6 yr.  . Hypokalemia 05/30/2018  . Impingement syndrome of shoulder region 10/2014   left  . Insomnia 06/01/2015  . Ischemic ulcer of toe of left foot ([place])   . Left shoulder pain 12/16/2015  . Low back pain without sciatica 10/29/2009  . Lupus ([place])   . Peripheral vascular disease ([place])   . Pneumonia   . RA (rheumatoid arthritis) ([place])   . Rotator cuff tear 11/04/2014   Review of Systems:  Negative except as stated in HPI.  Physical Exam:  Vitals:   01/08/20 1051  BP: 139/85  Pulse: 84  Temp: 97.6 F (36.4 C)  TempSrc: Oral  SpO2: 98%  Weight: 175 lb 8 oz (79.6 kg)  Height: 5' 7.5" (1.715 m)   Physical Exam  Constitutional: Appears well-developed and well-nourished. No distress.  HENT: Normocephalic and atraumatic, EOMI, conjunctiva normal, moist mucous membranes Cardiovascular: Normal rate, regular rhythm, S1 and S2 present, no murmurs, rubs, gallops.  Distal pulses intact, capillary refill <2sec Respiratory: No respiratory distress, no accessory muscle use.  Effort is normal.  Lungs are clear to auscultation bilaterally. Musculoskeletal: Normal bulk and tone.  Trace pitting edema in bilateral lower extremities Neurological: Is alert and oriented x4, no apparent focal deficits noted. Skin: Warm and dry.  No rash, erythema, lesions noted. Psychiatric: Normal mood and  affect. Behavior is normal. Judgment and thought content normal.    Assessment & Plan:   See Encounters Tab for problem based charting.  Patient discussed with Dr. Daryll Drown

## 2020-01-08 NOTE — Patient Instructions (Addendum)
[name] [name],  It was a pleasure seeing you in clinic. Today we discussed:   Blood pressure:  At this time, I would like you to start taking valsartan 40mg  daily in addition to your amlodipine 10mg  daily and chlorthalidone 25mg  daily. I will see you in 2 weeks for a BP check and lab work.  If you have any questions or concerns, please call our clinic at [phone] between 9am-5pm and after hours call [phone] and ask for the internal medicine resident on call. If you feel you are having a medical emergency please call 911.   Thank you, we look forward to helping you remain healthy!   If you have not already done so, I recommend getting your COVID 19 vaccine.  To schedule an appointment for a COVID vaccine or be added to the vaccine wait list: Go to [email or web address]   OR Go to [email or web address]                  OR Call [phone]                                     OR Call [phone] and select Option 2

## 2020-01-09 NOTE — Assessment & Plan Note (Signed)
Patient with history of chronic claudication s/p femoralpopliteal bypass of left lower extremity in setting of critical lower limb ischemia. He notes doing well overall but does have some mild ongoing claudication when walking more than 15-20 minutes. He does have an appointment with Dr. Trula Slade on 8/30.  On examination, bilateral lower extremities with trace pitting edema; equal in temperature and sensation. Dorsalis pedis pulses palpable with adequate capillary refill time.  Plan: - Continue aspirin and atorvastatin daily - F/u with vascular surgery

## 2020-01-09 NOTE — Assessment & Plan Note (Signed)
Patient is on amlodipine 10mg  daily and chlorthalidone 25mg  daily and tolerating well. He is noted to have elevated BP over past several visits. He is asymptomatic. BP Readings from Last 3 Encounters:  01/08/20 139/85  11/05/19 134/80  07/14/19 (!) 140/95   Plan:  Continue amlodipine 10mg  daily and chlorthalidone 25mg  daily Start valsartan 40mg  daily  BP and BMP check in 2 weeks

## 2020-01-14 NOTE — Progress Notes (Signed)
Internal Medicine Clinic Attending ° °Case discussed with Dr. Aslam  At the time of the visit.  We reviewed the resident’s history and exam and pertinent patient test results.  I agree with the assessment, diagnosis, and plan of care documented in the resident’s note.  °

## 2020-01-22 ENCOUNTER — Encounter (HOSPITAL_COMMUNITY): Payer: Self-pay

## 2020-01-22 ENCOUNTER — Other Ambulatory Visit: Payer: Self-pay | Admitting: Internal Medicine

## 2020-01-22 ENCOUNTER — Emergency Department (HOSPITAL_COMMUNITY): Payer: Medicare Other | Admitting: Certified Registered Nurse Anesthetist

## 2020-01-22 ENCOUNTER — Emergency Department (HOSPITAL_COMMUNITY): Payer: Medicare Other

## 2020-01-22 ENCOUNTER — Encounter (HOSPITAL_COMMUNITY)
Admission: EM | Disposition: A | Discharge status: Home / Self Care | Payer: Self-pay | Source: Home / Self Care | Attending: Vascular Surgery

## 2020-01-22 ENCOUNTER — Inpatient Hospital Stay (HOSPITAL_COMMUNITY)
Admission: EM | Admit: 2020-01-22 | Discharge: 2020-01-24 | DRG: 272 | Disposition: A | Discharge status: Home / Self Care | Payer: Medicare Other | Attending: Vascular Surgery | Admitting: Vascular Surgery

## 2020-01-22 DIAGNOSIS — Z7982 Long term (current) use of aspirin: Secondary | ICD-10-CM | POA: Diagnosis not present

## 2020-01-22 DIAGNOSIS — K219 Gastro-esophageal reflux disease without esophagitis: Secondary | ICD-10-CM | POA: Diagnosis not present

## 2020-01-22 DIAGNOSIS — M069 Rheumatoid arthritis, unspecified: Secondary | ICD-10-CM | POA: Diagnosis not present

## 2020-01-22 DIAGNOSIS — I998 Other disorder of circulatory system: Secondary | ICD-10-CM | POA: Diagnosis not present

## 2020-01-22 DIAGNOSIS — K8020 Calculus of gallbladder without cholecystitis without obstruction: Secondary | ICD-10-CM | POA: Diagnosis not present

## 2020-01-22 DIAGNOSIS — I10 Essential (primary) hypertension: Secondary | ICD-10-CM | POA: Diagnosis not present

## 2020-01-22 DIAGNOSIS — Z79899 Other long term (current) drug therapy: Secondary | ICD-10-CM | POA: Diagnosis not present

## 2020-01-22 DIAGNOSIS — Z8249 Family history of ischemic heart disease and other diseases of the circulatory system: Secondary | ICD-10-CM | POA: Diagnosis not present

## 2020-01-22 DIAGNOSIS — Z888 Allergy status to other drugs, medicaments and biological substances status: Secondary | ICD-10-CM | POA: Diagnosis not present

## 2020-01-22 DIAGNOSIS — Z885 Allergy status to narcotic agent status: Secondary | ICD-10-CM | POA: Diagnosis not present

## 2020-01-22 DIAGNOSIS — M329 Systemic lupus erythematosus, unspecified: Secondary | ICD-10-CM | POA: Diagnosis not present

## 2020-01-22 DIAGNOSIS — T82868A Thrombosis of vascular prosthetic devices, implants and grafts, initial encounter: Principal | ICD-10-CM | POA: Diagnosis present

## 2020-01-22 DIAGNOSIS — M62262 Nontraumatic ischemic infarction of muscle, left lower leg: Secondary | ICD-10-CM | POA: Diagnosis not present

## 2020-01-22 DIAGNOSIS — Y828 Other medical devices associated with adverse incidents: Secondary | ICD-10-CM | POA: Diagnosis present

## 2020-01-22 DIAGNOSIS — K5730 Diverticulosis of large intestine without perforation or abscess without bleeding: Secondary | ICD-10-CM | POA: Diagnosis not present

## 2020-01-22 DIAGNOSIS — T82898A Other specified complication of vascular prosthetic devices, implants and grafts, initial encounter: Secondary | ICD-10-CM | POA: Diagnosis not present

## 2020-01-22 DIAGNOSIS — M79605 Pain in left leg: Secondary | ICD-10-CM | POA: Diagnosis not present

## 2020-01-22 DIAGNOSIS — I25110 Atherosclerotic heart disease of native coronary artery with unstable angina pectoris: Secondary | ICD-10-CM | POA: Diagnosis not present

## 2020-01-22 DIAGNOSIS — M0579 Rheumatoid arthritis with rheumatoid factor of multiple sites without organ or systems involvement: Secondary | ICD-10-CM

## 2020-01-22 DIAGNOSIS — R52 Pain, unspecified: Secondary | ICD-10-CM | POA: Diagnosis not present

## 2020-01-22 DIAGNOSIS — F17210 Nicotine dependence, cigarettes, uncomplicated: Secondary | ICD-10-CM | POA: Diagnosis present

## 2020-01-22 DIAGNOSIS — I70202 Unspecified atherosclerosis of native arteries of extremities, left leg: Secondary | ICD-10-CM | POA: Diagnosis not present

## 2020-01-22 DIAGNOSIS — Z7952 Long term (current) use of systemic steroids: Secondary | ICD-10-CM | POA: Diagnosis not present

## 2020-01-22 DIAGNOSIS — Z7902 Long term (current) use of antithrombotics/antiplatelets: Secondary | ICD-10-CM

## 2020-01-22 DIAGNOSIS — I739 Peripheral vascular disease, unspecified: Secondary | ICD-10-CM

## 2020-01-22 DIAGNOSIS — Z20822 Contact with and (suspected) exposure to covid-19: Secondary | ICD-10-CM | POA: Diagnosis present

## 2020-01-22 DIAGNOSIS — Z743 Need for continuous supervision: Secondary | ICD-10-CM | POA: Diagnosis not present

## 2020-01-22 HISTORY — PX: ULTRASOUND GUIDANCE FOR VASCULAR ACCESS: SHX6516

## 2020-01-22 HISTORY — PX: INTRAOPERATIVE ARTERIOGRAM: SHX5157

## 2020-01-22 LAB — CBC WITH DIFFERENTIAL/PLATELET
Abs Immature Granulocytes: 0.02 10*3/uL (ref 0.00–0.07)
Basophils Absolute: 0.1 10*3/uL (ref 0.0–0.1)
Basophils Relative: 1 %
Eosinophils Absolute: 0.2 10*3/uL (ref 0.0–0.5)
Eosinophils Relative: 3 %
HCT: 37 % — ABNORMAL LOW (ref 39.0–52.0)
Hemoglobin: 11.4 g/dL — ABNORMAL LOW (ref 13.0–17.0)
Immature Granulocytes: 0 %
Lymphocytes Relative: 22 %
Lymphs Abs: 1.7 10*3/uL (ref 0.7–4.0)
MCH: 24.7 pg — ABNORMAL LOW (ref 26.0–34.0)
MCHC: 30.8 g/dL (ref 30.0–36.0)
MCV: 80.1 fL (ref 80.0–100.0)
Monocytes Absolute: 0.5 10*3/uL (ref 0.1–1.0)
Monocytes Relative: 7 %
Neutro Abs: 5.2 10*3/uL (ref 1.7–7.7)
Neutrophils Relative %: 67 %
Platelets: 207 10*3/uL (ref 150–400)
RBC: 4.62 MIL/uL (ref 4.22–5.81)
RDW: 15.9 % — ABNORMAL HIGH (ref 11.5–15.5)
WBC: 7.7 10*3/uL (ref 4.0–10.5)
nRBC: 0 % (ref 0.0–0.2)

## 2020-01-22 LAB — BASIC METABOLIC PANEL
Anion gap: 12 (ref 5–15)
BUN: 15 mg/dL (ref 6–20)
CO2: 28 mmol/L (ref 22–32)
Calcium: 8.9 mg/dL (ref 8.9–10.3)
Chloride: 94 mmol/L — ABNORMAL LOW (ref 98–111)
Creatinine, Ser: 1.02 mg/dL (ref 0.61–1.24)
GFR calc Af Amer: >60 mL/min (ref >60)
GFR calc non Af Amer: >60 mL/min (ref >60)
Glucose, Bld: 108 mg/dL — ABNORMAL HIGH (ref 70–99)
Potassium: 3 mmol/L — ABNORMAL LOW (ref 3.5–5.1)
Sodium: 134 mmol/L — ABNORMAL LOW (ref 135–145)

## 2020-01-22 LAB — I-STAT CHEM 8, ED
BUN: 16 mg/dL (ref 6–20)
Calcium, Ion: 1.12 mmol/L — ABNORMAL LOW (ref 1.15–1.40)
Chloride: 93 mmol/L — ABNORMAL LOW (ref 98–111)
Creatinine, Ser: 1.1 mg/dL (ref 0.61–1.24)
Glucose, Bld: 104 mg/dL — ABNORMAL HIGH (ref 70–99)
HCT: 35 % — ABNORMAL LOW (ref 39.0–52.0)
Hemoglobin: 11.9 g/dL — ABNORMAL LOW (ref 13.0–17.0)
Potassium: 3 mmol/L — ABNORMAL LOW (ref 3.5–5.1)
Sodium: 138 mmol/L (ref 135–145)
TCO2: 27 mmol/L (ref 22–32)

## 2020-01-22 LAB — SARS CORONAVIRUS 2 BY RT PCR (HOSPITAL ORDER, PERFORMED IN ~~LOC~~ HOSPITAL LAB): SARS Coronavirus 2: NEGATIVE

## 2020-01-22 SURGERY — INTRA OPERATIVE ARTERIOGRAM
Anesthesia: General | Site: Leg Upper | Laterality: Right

## 2020-01-22 MED ORDER — SUFENTANIL CITRATE 50 MCG/ML IV SOLN
INTRAVENOUS | Status: AC
  Filled 2020-01-22: qty 1

## 2020-01-22 MED ORDER — SODIUM CHLORIDE 0.9 % IV SOLN
0.5000 mg/h | INTRAVENOUS | Status: DC
  Filled 2020-01-22: qty 10

## 2020-01-22 MED ORDER — SODIUM CHLORIDE 0.9 % IV SOLN
INTRAVENOUS | Status: DC | PRN
  Administered 2020-01-22: 500 mL

## 2020-01-22 MED ORDER — LIDOCAINE 2% (20 MG/ML) 5 ML SYRINGE
INTRAMUSCULAR | Status: DC | PRN
  Administered 2020-01-22: 100 mg via INTRAVENOUS

## 2020-01-22 MED ORDER — HEPARIN SODIUM (PORCINE) 1000 UNIT/ML IJ SOLN
INTRAMUSCULAR | Status: DC | PRN
  Administered 2020-01-22: 4000 [IU] via INTRAVENOUS

## 2020-01-22 MED ORDER — SUFENTANIL CITRATE 50 MCG/ML IV SOLN
INTRAVENOUS | Status: DC | PRN
  Administered 2020-01-22: 10 ug via INTRAVENOUS

## 2020-01-22 MED ORDER — HEPARIN BOLUS VIA INFUSION
5500.0000 [IU] | Freq: Once | INTRAVENOUS | Status: AC
  Administered 2020-01-22: 5500 [IU] via INTRAVENOUS
  Filled 2020-01-22: qty 5500

## 2020-01-22 MED ORDER — CEFAZOLIN SODIUM-DEXTROSE 2-3 GM-%(50ML) IV SOLR
INTRAVENOUS | Status: DC | PRN
  Administered 2020-01-22: 2 g via INTRAVENOUS

## 2020-01-22 MED ORDER — PHENYLEPHRINE HCL (PRESSORS) 10 MG/ML IV SOLN
INTRAVENOUS | Status: DC | PRN
  Administered 2020-01-22: 120 ug via INTRAVENOUS
  Administered 2020-01-22 (×2): 80 ug via INTRAVENOUS
  Administered 2020-01-22: 120 ug via INTRAVENOUS

## 2020-01-22 MED ORDER — LACTATED RINGERS IV SOLN: INTRAVENOUS | Status: DC | PRN

## 2020-01-22 MED ORDER — HYDROMORPHONE HCL 1 MG/ML IJ SOLN
1.0000 mg | Freq: Once | INTRAMUSCULAR | Status: AC
  Administered 2020-01-22: 1 mg via INTRAVENOUS
  Filled 2020-01-22: qty 1

## 2020-01-22 MED ORDER — SODIUM CHLORIDE (PF) 0.9 % IJ SOLN
INTRAVENOUS | Status: DC | PRN
  Administered 2020-01-22: 22 mL via INTRAMUSCULAR

## 2020-01-22 MED ORDER — SUCCINYLCHOLINE CHLORIDE 200 MG/10ML IV SOSY
PREFILLED_SYRINGE | INTRAVENOUS | Status: DC | PRN
  Administered 2020-01-22: 100 mg via INTRAVENOUS

## 2020-01-22 MED ORDER — PROPOFOL 10 MG/ML IV BOLUS
INTRAVENOUS | Status: AC
  Filled 2020-01-22: qty 20

## 2020-01-22 MED ORDER — PROPOFOL 10 MG/ML IV BOLUS
INTRAVENOUS | Status: DC | PRN
  Administered 2020-01-22: 80 mg via INTRAVENOUS
  Administered 2020-01-22: 70 mg via INTRAVENOUS

## 2020-01-22 MED ORDER — ONDANSETRON HCL 4 MG/2ML IJ SOLN
INTRAMUSCULAR | Status: DC | PRN
  Administered 2020-01-22: 4 mg via INTRAVENOUS

## 2020-01-22 MED ORDER — CEFAZOLIN SODIUM-DEXTROSE 2-4 GM/100ML-% IV SOLN
INTRAVENOUS | Status: AC
  Filled 2020-01-22: qty 100

## 2020-01-22 MED ORDER — PHENYLEPHRINE HCL-NACL 10-0.9 MG/250ML-% IV SOLN
INTRAVENOUS | Status: DC | PRN
  Administered 2020-01-22: 50 ug/min via INTRAVENOUS

## 2020-01-22 MED ORDER — HEPARIN (PORCINE) 25000 UT/250ML-% IV SOLN
1350.0000 [IU]/h | INTRAVENOUS | Status: DC
  Administered 2020-01-22: 1350 [IU]/h via INTRAVENOUS
  Filled 2020-01-22: qty 250

## 2020-01-22 MED ORDER — DEXAMETHASONE SODIUM PHOSPHATE 10 MG/ML IJ SOLN
INTRAMUSCULAR | Status: DC | PRN
  Administered 2020-01-22: 10 mg via INTRAVENOUS

## 2020-01-22 MED ORDER — 0.9 % SODIUM CHLORIDE (POUR BTL) OPTIME
TOPICAL | Status: DC | PRN
  Administered 2020-01-22: 2000 mL

## 2020-01-22 MED ORDER — IOHEXOL 350 MG/ML SOLN
100.0000 mL | Freq: Once | INTRAVENOUS | Status: AC | PRN
  Administered 2020-01-22: 100 mL via INTRAVENOUS

## 2020-01-22 MED ORDER — MIDAZOLAM HCL 2 MG/2ML IJ SOLN
INTRAMUSCULAR | Status: AC
  Filled 2020-01-22: qty 2

## 2020-01-22 MED ORDER — SODIUM CHLORIDE 0.9 % IV SOLN
INTRAVENOUS | Status: AC
  Filled 2020-01-22 (×2): qty 1.2

## 2020-01-22 SURGICAL SUPPLY — 71 items
AGENT HMST SPONGE THK3/8 (HEMOSTASIS)
BAG SNAP BAND KOVER 36X36 (MISCELLANEOUS) ×6 IMPLANT
BANDAGE ESMARK 6X9 LF (GAUZE/BANDAGES/DRESSINGS) IMPLANT
BNDG CMPR 9X6 STRL LF SNTH (GAUZE/BANDAGES/DRESSINGS)
BNDG ESMARK 6X9 LF (GAUZE/BANDAGES/DRESSINGS)
CANISTER SUCT 3000ML PPV (MISCELLANEOUS) ×5 IMPLANT
CATH INFUS 135CMX50CM (CATHETERS) ×3 IMPLANT
CATH QUICKCROSS .035X135CM (MICROCATHETER) ×3 IMPLANT
CATH QUICKCROSS SUPP .035X90CM (MICROCATHETER) ×3 IMPLANT
CLIP VESOCCLUDE MED 24/CT (CLIP) ×8 IMPLANT
CLIP VESOCCLUDE SM WIDE 24/CT (CLIP) ×8 IMPLANT
COVER BACK TABLE 60X90IN (DRAPES) ×3 IMPLANT
COVER DOME SNAP 22 D (MISCELLANEOUS) ×3 IMPLANT
COVER PROBE W GEL 5X96 (DRAPES) ×5 IMPLANT
COVER WAND RF STERILE (DRAPES) ×5 IMPLANT
CUFF TOURN SGL QUICK 24 (TOURNIQUET CUFF)
CUFF TOURN SGL QUICK 34 (TOURNIQUET CUFF)
CUFF TOURN SGL QUICK 42 (TOURNIQUET CUFF) IMPLANT
CUFF TRNQT CYL 24X4X16.5-23 (TOURNIQUET CUFF) IMPLANT
CUFF TRNQT CYL 34X4.125X (TOURNIQUET CUFF) IMPLANT
DRAIN CHANNEL 15F RND FF W/TCR (WOUND CARE) IMPLANT
DRAPE C-ARM 42X72 X-RAY (DRAPES) ×5 IMPLANT
DRSG TEGADERM 4X4.75 (GAUZE/BANDAGES/DRESSINGS) ×6 IMPLANT
ELECT REM PT RETURN 9FT ADLT (ELECTROSURGICAL) ×5
ELECTRODE REM PT RTRN 9FT ADLT (ELECTROSURGICAL) ×3 IMPLANT
EVACUATOR SILICONE 100CC (DRAIN) IMPLANT
GAUZE SPONGE 4X4 12PLY STRL (GAUZE/BANDAGES/DRESSINGS) ×3 IMPLANT
GLIDEWIRE ADV .035X260CM (WIRE) ×3 IMPLANT
GLOVE BIO SURGEON STRL SZ7.5 (GLOVE) ×5 IMPLANT
GLOVE BIOGEL PI IND STRL 8 (GLOVE) ×3 IMPLANT
GLOVE BIOGEL PI INDICATOR 8 (GLOVE) ×2
GLOVE INDICATOR 7.0 STRL GRN (GLOVE) ×3 IMPLANT
GOWN STRL REUS W/ TWL LRG LVL3 (GOWN DISPOSABLE) ×6 IMPLANT
GOWN STRL REUS W/ TWL XL LVL3 (GOWN DISPOSABLE) ×6 IMPLANT
GOWN STRL REUS W/TWL LRG LVL3 (GOWN DISPOSABLE) ×10
GOWN STRL REUS W/TWL XL LVL3 (GOWN DISPOSABLE) ×10
HEMOSTAT SPONGE AVITENE ULTRA (HEMOSTASIS) IMPLANT
INSERT FOGARTY SM (MISCELLANEOUS) IMPLANT
KIT BASIN OR (CUSTOM PROCEDURE TRAY) ×5 IMPLANT
KIT TURNOVER KIT B (KITS) ×5 IMPLANT
NS IRRIG 1000ML POUR BTL (IV SOLUTION) ×10 IMPLANT
PACK PERIPHERAL VASCULAR (CUSTOM PROCEDURE TRAY) ×5 IMPLANT
PAD ARMBOARD 7.5X6 YLW CONV (MISCELLANEOUS) ×10 IMPLANT
SET MICROPUNCTURE 5F STIFF (MISCELLANEOUS) ×3 IMPLANT
SHEATH FLEX ANSEL ST 6FR 45CM (SHEATH) ×3 IMPLANT
SHEATH PINNACLE 5F 10CM (SHEATH) ×3 IMPLANT
SHEATH PINNACLE 6F 10CM (SHEATH) ×3 IMPLANT
STAPLER VISISTAT 35W (STAPLE) IMPLANT
STOPCOCK 4 WAY LG BORE MALE ST (IV SETS) IMPLANT
SUT ETHILON 3 0 PS 1 (SUTURE) IMPLANT
SUT GORETEX 5 0 TT13 24 (SUTURE) IMPLANT
SUT GORETEX 6.0 TT13 (SUTURE) IMPLANT
SUT MNCRL AB 4-0 PS2 18 (SUTURE) ×15 IMPLANT
SUT PROLENE 5 0 C 1 24 (SUTURE) ×8 IMPLANT
SUT PROLENE 6 0 BV (SUTURE) ×11 IMPLANT
SUT PROLENE 7 0 BV 1 (SUTURE) IMPLANT
SUT SILK 2 0 PERMA HAND 18 BK (SUTURE) IMPLANT
SUT SILK 3 0 (SUTURE)
SUT SILK 3-0 18XBRD TIE 12 (SUTURE) IMPLANT
SUT VIC AB 2-0 CT1 27 (SUTURE) ×10
SUT VIC AB 2-0 CT1 TAPERPNT 27 (SUTURE) ×6 IMPLANT
SUT VIC AB 3-0 SH 27 (SUTURE) ×15
SUT VIC AB 3-0 SH 27X BRD (SUTURE) ×9 IMPLANT
SYR 10ML LL (SYRINGE) ×9 IMPLANT
TOWEL GREEN STERILE (TOWEL DISPOSABLE) ×5 IMPLANT
TRAY FOLEY MTR SLVR 16FR STAT (SET/KITS/TRAYS/PACK) ×5 IMPLANT
TUBING EXTENTION W/L.L. (IV SETS) IMPLANT
TUBING INJECTOR 48 (MISCELLANEOUS) ×3 IMPLANT
UNDERPAD 30X36 HEAVY ABSORB (UNDERPADS AND DIAPERS) ×5 IMPLANT
WATER STERILE IRR 1000ML POUR (IV SOLUTION) ×5 IMPLANT
WIRE G V18X300CM (WIRE) ×6 IMPLANT

## 2020-01-22 NOTE — Progress Notes (Signed)
[facility] for Heparin Indication: Ischemic Leg   Allergies  Allergen Reactions  . Morphine And Related Rash  . Ramipril Rash    Per patient on R arm and leg only; no angioedema  . Tramadol Itching and Rash    Patient Measurements: Height: 5' 7.5" (171.5 cm) Weight: 79.6 kg (175 lb 7.8 oz) IBW/kg (Calculated) : 67.25 Heparin Dosing Weight: 79.6 kg  Vital Signs: Temp: 98.5 F (36.9 C) (07/29 1919) Temp Source: Oral (07/29 1919) BP: 127/76 (07/29 2038) Pulse Rate: 102 (07/29 2038)  Labs: Recent Labs    01/22/20 1937 01/22/20 1959  HGB 11.4* 11.9*  HCT 37.0* 35.0*  PLT 207  --   CREATININE 1.02 1.10    Estimated Creatinine Clearance: 68.8 mL/min (by C-G formula based on SCr of 1.1 mg/dL).   Medical History: Past Medical History:  Diagnosis Date  . Anemia   . Anemia 08/06/2012  . 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 Community acquired pneumonia 11/07/2011  . DDD (degenerative disc disease), lumbosacral     and grade 2 slip  . GERD (gastroesophageal reflux disease)   . Heart murmur   . History of anemia    no current med.  . Hypertension    states is borderline on medication; has been on med. x 5-6 yr.  . Hypokalemia 05/30/2018  . Impingement syndrome of shoulder region 10/2014   left  . Insomnia 06/01/2015  . Ischemic ulcer of toe of left foot ([place])   . Left shoulder pain 12/16/2015  . Low back pain without sciatica 10/29/2009  . Lupus ([place])   . Peripheral vascular disease ([place])   . Pneumonia   . RA (rheumatoid arthritis) ([place])   . Rotator cuff tear 11/04/2014    Medications:  Scheduled:  . heparin  5,500 Units Intravenous Once    Assessment: Patient is a 27 yom that presents to the ED with an inschemic left leg. The patient is on aspirn and plavix PTA. Pharmacy has been asked to dose heparin at this time.  Goal of Therapy:  Heparin level 0.3-0.7 units/ml Monitor platelets by anticoagulation protocol: Yes   Plan:  -  Heparin bolus 5500 units IV x 1 dose - Heparin drip @ 1350 units/hr - Heparin level in ~ 6 hours  - Monitor patient for s/s of bleeding and CBC while on heparin   Duanne Limerick PharmD. BCPS  01/22/2020,9:10 PM

## 2020-01-22 NOTE — ED Notes (Signed)
Patient transported to CT 

## 2020-01-22 NOTE — ED Triage Notes (Signed)
Patient presents to the ED via [facility] with c/o 7/10 L foot pain +decrease sensation that started last night. Denies injury or trauma. +pulses with EMS. Patient appears in no acute distress, respirs even and unlabored. AAOx3.

## 2020-01-22 NOTE — H&P (Signed)
Hospital Consult    Reason for Consult:  Ischemic left leg Referring Physician:  ED MRN #:  [id number]  History of Present Illness: This is a 59 y.o. male with history of hypertension, tobacco abuse, peripheral vascular disease that presents with ischemic left leg.  He states he had acute onset of left foot pain last night with profound numbness in the foot up to the calf.  Feels he can move the foot ok.  He decided to come in and be evaluated here in the emergency room today.  He still smoking.  Did take his aspirin and Plavix.  On 02/26/2019 he had a left common femoral to below-knee popliteal artery bypass with 6 mm ringed PTFE by Dr. Trula Slade.  He was last seen by Dr. Trula Slade on 07/14/2019 his bypass was patent and doing well.  Past Medical History:  Diagnosis Date  . Anemia   . Anemia 08/06/2012  . 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 Community acquired pneumonia 11/07/2011  . DDD (degenerative disc disease), lumbosacral     and grade 2 slip  . GERD (gastroesophageal reflux disease)   . Heart murmur   . History of anemia    no current med.  . Hypertension    states is borderline on medication; has been on med. x 5-6 yr.  . Hypokalemia 05/30/2018  . Impingement syndrome of shoulder region 10/2014   left  . Insomnia 06/01/2015  . Ischemic ulcer of toe of left foot ([place] [place])   . Left shoulder pain 12/16/2015  . Low back pain without sciatica 10/29/2009  . Lupus ([place])   . Peripheral vascular disease ([place])   . Pneumonia   . RA (rheumatoid arthritis) ([place])   . Rotator cuff tear 11/04/2014    Past Surgical History:  Procedure Laterality Date  . ABDOMINAL AORTAGRAM  02/20/2019  . ABDOMINAL AORTOGRAM W/LOWER EXTREMITY N/A 02/20/2019   Procedure: ABDOMINAL AORTOGRAM W/LOWER EXTREMITY;  Surgeon: Marty Heck, MD;  Location: [facility];  Service: Cardiovascular;  Laterality: N/A;  . ACHILLES TENDON SURGERY Bilateral   . FEMORAL-POPLITEAL BYPASS GRAFT Left 02/26/2019   Procedure: BYPASS  GRAFT FEMORAL-POPLITEAL ARTERY LEFT LEG USING 91mm PROPATEN GRAFT;  Surgeon: Serafina Mitchell, MD;  Location: [facility];  Service: Vascular;  Laterality: Left;  . LEFT HEART CATH AND CORONARY ANGIOGRAPHY N/A 03/15/2017   Procedure: LEFT HEART CATH AND CORONARY ANGIOGRAPHY;  Surgeon: Nelva Bush, MD;  Location: [facility];  Service: Cardiovascular;  Laterality: N/A;  . OSTEOTOMY AND ULNAR SHORTENING Right 07/22/2002  . SHOULDER ARTHROSCOPY WITH BICEPSTENOTOMY Right 03/11/2015   Procedure: SHOULDER ARTHROSCOPY WITH BICEPSTENOTOMY;  Surgeon: Ninetta Lights, MD;  Location: [facility];  Service: Orthopedics;  Laterality: Right;  . SHOULDER ARTHROSCOPY WITH DISTAL CLAVICLE RESECTION Left 11/19/2014   Procedure: SHOULDER ARTHROSCOPY WITH DISTAL CLAVICLE RESECTION;  Surgeon: Kathryne Hitch, MD;  Location: [facility];  Service: Orthopedics;  Laterality: Left;  . SHOULDER ARTHROSCOPY WITH ROTATOR CUFF REPAIR AND SUBACROMIAL DECOMPRESSION Left 11/19/2014   Procedure: LEFT SHOULDER ARTHROSCOPY, DEBRIDEMENT DISTAL CLAVICLE EXCISION, ACROMIOPLASTY WITH ROTATOR CUFF REPAIR ;  Surgeon: Kathryne Hitch, MD;  Location: [facility];  Service: Orthopedics;  Laterality: Left;  . TRANSFORAMINAL LUMBAR INTERBODY FUSION (TLIF) WITH PEDICLE SCREW FIXATION 1 LEVEL N/A 01/03/2018   Procedure: TRANSFORAMINAL LUMBAR INTERBODY FUSION (TLIF) LUMBAR FIVE-SACRAL ONE;  Surgeon: Melina Schools, MD;  Location: [facility];  Service: Orthopedics;  Laterality: N/A;  . VIDEO ASSISTED THORACOSCOPY (VATS)/DECORTICATION Right 11/21/2011  drainage of empyema    Allergies  Allergen Reactions  . Morphine And Related Rash  . Ramipril Rash    Per patient on R arm and leg only; no angioedema  . Tramadol Itching and Rash    Prior to Admission medications   Medication Sig Start Date End Date Taking? Authorizing Provider  amLODipine (NORVASC) 10 MG tablet TAKE 1 TABLET BY MOUTH EVERY DAY 09/25/19   Jean Rosenthal, MD  ascorbic acid (VITAMIN C) 500 MG tablet take 3 tablets daily    [provider]  aspirin (BAYER ASPIRIN) 325 MG tablet as directed    [provider]  atorvastatin (LIPITOR) 80 MG tablet Take 1 tablet (80 mg total) by mouth daily at 6 PM. 11/06/19   Oda Kilts, MD  chlorthalidone (HYGROTON) 25 MG tablet Take 1 tablet (25 mg total) by mouth daily. 01/08/20   Harvie Heck, MD  clopidogrel (PLAVIX) 75 MG tablet Take 1 tablet (75 mg total) by mouth daily. 11/06/19 11/05/20  Oda Kilts, MD  diclofenac Sodium (VOLTAREN) 1 % GEL  07/08/19   [provider]  docusate sodium (COLACE) 100 MG capsule Take 1 capsule (100 mg total) by mouth daily. Patient taking differently: Take 100 mg by mouth daily as needed for mild constipation.  02/27/19   Welford Roche, MD  Glycerin-Polysorbate 80 (REFRESH DRY EYE THERAPY OP) Place 1 drop into both eyes 3 (three) times daily as needed (dry/irritated eyes.).    [provider]  Multiple Vitamin (MULTI-VITAMINS) TABS take 1 tablet by oral route  every day with food    [provider]  naloxone (NARCAN) 2 MG/2ML injection Inject 2 mg into the muscle once as needed (opiod overdose (call 911, inject intramuscularly in shoulder or thigh. Repeat every 3 minutes)).     [provider]  nicotine (NICODERM CQ - DOSED IN MG/24 HOURS) 21 mg/24hr patch Place 1 patch (21 mg total) onto the skin daily. Patient not taking: Reported on 05/10/2019 02/28/19   Welford Roche, MD  oxyCODONE-acetaminophen (PERCOCET) 10-325 MG tablet Take 1 tablet by mouth See admin instructions. Five times daily as needed for pain. 04/15/19   [provider]  pantoprazole (PROTONIX) 40 MG tablet TAKE 1 TABLET EVERY DAY 07/28/19   Agyei, Caprice Kluver, MD  polyethylene glycol (MIRALAX / GLYCOLAX) 17 g packet Take 17 g by mouth daily as needed for mild constipation. 02/27/19   Santos-Sanchez, Merlene Morse, MD  predniSONE  (DELTASONE) 5 MG tablet TAKE 1 TABLET BY MOUTH EVERY DAY WITH BREAKFAST 01/22/20   Mosetta Anis, MD  valsartan (DIOVAN) 40 MG tablet Take 1 tablet (40 mg total) by mouth daily. 01/08/20 01/07/21  Harvie Heck, MD    Social History   Socioeconomic History  . Marital status: Single    Spouse name: Not on file  . Number of children: 0  . Years of education: Not on file  . Highest education level: Not on file  Occupational History  . Occupation: Disabled    Comment: 2/2 RA  Tobacco Use  . Smoking status: Current Every Day Smoker    Packs/day: 0.25    Years: 20.00    Pack years: 5.00    Types: Cigarettes    Last attempt to quit: 02/19/2019    Years since quitting: 0.9  . Smokeless tobacco: Never Used  . Tobacco comment: 1-2 cigs per day; trying to quit  Vaping Use  . Vaping Use: Never used  Substance and Sexual Activity  .  Alcohol use: No    Alcohol/week: 0.0 standard drinks    Comment: sober 1998  . Drug use: No  . Sexual activity: Not on file  Other Topics Concern  . Not on file  Social History Narrative   On disability since 1992, used to work with city of [place]. Quit drinking in 1990.      Current Social History 11/05/2019        Patient lives by himself most of the time. Sometimes girlfriend's son stays with him in a one level home. There are 3 steps with handrail up to the entrance the patient uses.       Patient's method of transportation is personal truck. This was recently vandalized and patient doesn't have funds to repair at this time.      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      Identified important Relationships are "My girlfriend, Maudie Mercury."       Pets : American Terrier Market researcher), Lab ([place])       Interests / Fun: Walk, watch TV       Current Stressors: "Going through a lot the last 6-7 years; I worry too much about my health." (Discussed IBH, patient not interested at this time.)      Religious /  Personal Beliefs: Baptist       L. [place], BSN, RN-BC    Social Determinants of Health   Financial Resource Strain:   . Difficulty of Paying Living Expenses:   Food Insecurity:   . Worried About Charity fundraiser in the Last Year:   . Arboriculturist in the Last Year:   Transportation Needs:   . Film/video editor (Medical):   [name] Lack of Transportation (Non-Medical):   Physical Activity:   . Days of Exercise per Week:   . Minutes of Exercise per Session:   Stress:   . Feeling of Stress :   Social Connections:   . Frequency of Communication with Friends and Family:   . Frequency of Social Gatherings with Friends and Family:   . Attends Religious Services:   . Active Member of Clubs or Organizations:   . Attends Archivist Meetings:   [name] Marital Status:   Intimate Partner Violence:   . Fear of Current or Ex-Partner:   . Emotionally Abused:   [name] Physically Abused:   . Sexually Abused:      Family History  Problem Relation Age of Onset  . Heart attack Father 30  . Alcohol abuse Father   . Aneurysm Mother   . Stroke Neg Hx   . Cancer Neg Hx     ROS: [x]  Positive   [ ]  Negative   [ ]  All sytems reviewed and are negative  Cardiovascular: []  chest pain/pressure []  palpitations []  SOB lying flat []  DOE []  pain in legs while walking []  pain in legs at rest []  pain in legs at night []  non-healing ulcers []  hx of DVT []  swelling in legs  Pulmonary: []  productive cough []  asthma/wheezing []  home O2  Neurologic: []  weakness in []  arms []  legs [x]  numbness in []  arms [x]  legs (left foot) []  hx of CVA []  mini stroke [] difficulty speaking or slurred speech []  temporary loss of vision in one eye []  dizziness  Hematologic: []  hx of cancer []  bleeding problems []  problems with blood clotting easily  Endocrine:   []  diabetes []   thyroid disease  GI []  vomiting blood []  blood in stool  GU: []  CKD/renal failure []  HD--[]  M/W/F or []  T/T/S []   burning with urination []  blood in urine  Psychiatric: []  anxiety []  depression  Musculoskeletal: []  arthritis []  joint pain  Integumentary: []  rashes []  ulcers  Constitutional: []  fever []  chills   Physical Examination  Vitals:   01/22/20 1919 01/22/20 1945  BP: 107/68 111/73  Pulse: 92 90  Resp: 16 16  Temp: 98.5 F (36.9 C)   SpO2: 97% 97%   There is no height or weight on file to calculate BMI.  General:  WDWN in NAD Gait: Not observed HENT: WNL, normocephalic Pulmonary: normal non-labored breathing, without Rales, rhonchi,  wheezing Cardiac: regular, without  Murmurs, rubs or gallops Abdomen: soft, NT/ND, no masses Vascular Exam/Pulses: Palpable bilateral femoral pulse No pedal pulses or signals in left foot Left foot cold, able to wiggle left toes Extremities: with ischemic changes to left foot Musculoskeletal: no muscle wasting or atrophy  Neurologic: A&O X 3; Appropriate.  Left foot weakness and numbness as noted above   CBC    Component Value Date/Time   WBC 6.8 05/15/2019 0110   RBC 4.75 05/15/2019 0110   HGB 11.9 (L) 01/22/2020 1959   HGB 13.9 03/13/2017 1032   HCT 35.0 (L) 01/22/2020 1959   HCT 41.6 03/13/2017 1032   PLT 179 05/15/2019 0110   PLT 204 03/13/2017 1032   MCV 81.9 05/15/2019 0110   MCV 84 03/13/2017 1032   MCH 26.1 05/15/2019 0110   MCHC 31.9 05/15/2019 0110   RDW 14.2 05/15/2019 0110   RDW 13.7 03/13/2017 1032   LYMPHSABS 0.8 05/10/2019 1159   LYMPHSABS 1.8 03/13/2017 1032   MONOABS 0.3 05/10/2019 1159   EOSABS 0.0 05/10/2019 1159   EOSABS 0.2 03/13/2017 1032   BASOSABS 0.0 05/10/2019 1159   BASOSABS 0.0 03/13/2017 1032    BMET    Component Value Date/Time   NA 138 01/22/2020 1959   NA 140 05/30/2018 1413   K 3.0 (L) 01/22/2020 1959   CL 93 (L) 01/22/2020 1959   CO2 26 05/15/2019 0110   GLUCOSE 104 (H) 01/22/2020 1959   BUN 16 01/22/2020 1959   BUN 9 05/30/2018 1413   CREATININE 1.10 01/22/2020 1959    CREATININE 0.80 11/27/2012 0956   CALCIUM 8.9 05/15/2019 0110   GFRNONAA >60 05/15/2019 0110   GFRNONAA >89 11/27/2012 0956   GFRAA >60 05/15/2019 0110   GFRAA >89 11/27/2012 0956    COAGS: Lab Results  Component Value Date   INR 1.0 02/26/2019   INR 1.0 02/19/2019   INR 1.0 03/13/2017     Non-Invasive Vascular Imaging:    I reviewed his CTA abdomen pelvis with runoff and his left common femoral to below-knee popliteal bypass is occluded.  I do not see any significant inflow disease in the iliac arteries and the left common femoral is otherwise patent.  He does reconstitute anterior tibial artery distally.   ASSESSMENT/PLAN: This is a 59 y.o. male with history of peripheral vascular disease status post left common femoral to below-knee popliteal bypass with PTFE last year with Dr. Trula Slade that presents with occluded bypass and acute on chronic limb ischemia of the left leg.  Currently Rutherford IIa and is motor intact.  I have recommended left lower extremity arteriogram and possible thrombolysis.  Discussed if I cannot get down the bypass potentially would need a thrombectomy tonight.  If lysis discussed bleeding risk, going to  ICU, return tomorrow for lytics catheter check.    Marty Heck, MD [facility] Office: [place]

## 2020-01-22 NOTE — Anesthesia Preprocedure Evaluation (Addendum)
Anesthesia Evaluation  Patient identified by MRN, date of birth, ID band Patient awake    Reviewed: Allergy & Precautions, H&P , NPO status , Patient's Chart, lab work & pertinent test results  Airway Mallampati: II  TM Distance: >3 FB Neck ROM: Full    Dental no notable dental hx. (+) Edentulous Upper, Edentulous Lower, Dental Advisory Given   Pulmonary Current SmokerPatient did not abstain from smoking.,    Pulmonary exam normal breath sounds clear to auscultation       Cardiovascular hypertension, Pt. on medications + Peripheral Vascular Disease   Rhythm:Regular Rate:Normal     Neuro/Psych negative neurological ROS  negative psych ROS   GI/Hepatic Neg liver ROS, GERD  Medicated,  Endo/Other  negative endocrine ROS  Renal/GU negative Renal ROS  negative genitourinary   Musculoskeletal  (+) Arthritis , Rheumatoid disorders,    Abdominal   Peds  Hematology  (+) Blood dyscrasia, anemia ,   Anesthesia Other Findings   Reproductive/Obstetrics negative OB ROS                            Anesthesia Physical Anesthesia Plan  ASA: III and emergent  Anesthesia Plan: General   Post-op Pain Management:    Induction: Intravenous, Rapid sequence and Cricoid pressure planned  PONV Risk Score and Plan: 2 and Ondansetron and Midazolam  Airway Management Planned: Oral ETT  Additional Equipment:   Intra-op Plan:   Post-operative Plan: Extubation in OR  Informed Consent: I have reviewed the patients History and Physical, chart, labs and discussed the procedure including the risks, benefits and alternatives for the proposed anesthesia with the patient or authorized representative who has indicated his/her understanding and acceptance.     Dental advisory given  Plan Discussed with: CRNA  Anesthesia Plan Comments:         Anesthesia Quick Evaluation

## 2020-01-22 NOTE — Anesthesia Procedure Notes (Signed)
Procedure Name: Intubation Date/Time: 01/22/2020 10:14 PM Performed by: Claris Che, CRNA Pre-anesthesia Checklist: Patient identified, Emergency Drugs available, Suction available, Patient being monitored and Timeout performed Patient Re-evaluated:Patient Re-evaluated prior to induction Oxygen Delivery Method: Circle system utilized Preoxygenation: Pre-oxygenation with 100% oxygen Induction Type: IV induction, Rapid sequence and Cricoid Pressure applied Laryngoscope Size: Mac and 4 Grade View: Grade II Tube type: Oral Tube size: 8.0 mm Number of attempts: 1 Airway Equipment and Method: Stylet Placement Confirmation: ETT inserted through vocal cords under direct vision,  positive ETCO2 and breath sounds checked- equal and bilateral Secured at: 24 cm Tube secured with: Tape Dental Injury: Teeth and Oropharynx as per pre-operative assessment

## 2020-01-22 NOTE — ED Provider Notes (Signed)
[facility] EMERGENCY DEPARTMENT Provider Note   CSN: [id number] Arrival date & time: 01/22/20  1913     History Chief Complaint  Patient presents with  . Foot Pain    [name] [name] is a 59 y.o. man with history of peripheral arterial disease s/p femoral to below-knee popliteal artery bypass graft of his left lower extremity (02/2019), rheumatoid arthritis, HTN, and tobacco abuse who presents with a painful and cold L foot pain.  The patient states he was in his usual state of health until last night when he developed acute onset of left lower leg pain, starting first in his knee and subsequently extending down his calf into his ankle and foot. He states this pain was soon accompanied by coolness and eventually numbness. Concerned, he contacted 911 and was brought in by EMS to the emergency department.   During transport with EMS, EMS was reportedly able to palpate pulses in both bilateral lower extremities.  The patient states he has been able to move the foot but it is painful. He denies associated chest pain, shortness of breath, leg swelling. Also denies fever/chills, cough or cold symptoms, headache, abdominal pain, urinary complaints, N/V/D, dizziness.     Past Medical History:  Diagnosis Date  . Anemia   . Anemia 08/06/2012  . 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 Community acquired pneumonia 11/07/2011  . DDD (degenerative disc disease), lumbosacral     and grade 2 slip  . GERD (gastroesophageal reflux disease)   . Heart murmur   . History of anemia    no current med.  . Hypertension    states is borderline on medication; has been on med. x 5-6 yr.  . Hypokalemia 05/30/2018  . Impingement syndrome of shoulder region 10/2014   left  . Insomnia 06/01/2015  . Ischemic ulcer of toe of left foot ([place])   . Left shoulder pain 12/16/2015  . Low back pain without sciatica 10/29/2009  . Lupus ([place])   . Peripheral vascular disease ([place])   . Pneumonia   . RA (rheumatoid  arthritis) ([place])   . Rotator cuff tear 11/04/2014    Patient Active Problem List   Diagnosis Date Noted  . Limb ischemia 01/23/2020  . Ischemic leg 01/22/2020  . History of COVID-19 05/10/2019  . PAD (peripheral artery disease) ([place])   . History of critical lower limb ischemia 02/19/2019  . S/P lumbar fusion, Lower back pain 01/03/2018  . Coronary artery disease involving native coronary artery of native heart with unstable angina pectoris ([place])   . Preventative health care 03/13/2017  . Transaminitis 12/16/2015  . Bilateral shoulder pain 08/20/2012  . Tobacco use disorder 08/20/2012  . HTN (hypertension) 04/20/2011  . HLD (hyperlipidemia) 10/08/2009  . Rheumatoid arthritis ([place]) 08/11/2009    Past Surgical History:  Procedure Laterality Date  . ABDOMINAL AORTAGRAM  02/20/2019  . ABDOMINAL AORTOGRAM W/LOWER EXTREMITY N/A 02/20/2019   Procedure: ABDOMINAL AORTOGRAM W/LOWER EXTREMITY;  Surgeon: Marty Heck, MD;  Location: [facility];  Service: Cardiovascular;  Laterality: N/A;  . ACHILLES TENDON SURGERY Bilateral   . FEMORAL-POPLITEAL BYPASS GRAFT Left 02/26/2019   Procedure: BYPASS GRAFT FEMORAL-POPLITEAL ARTERY LEFT LEG USING 58mm PROPATEN GRAFT;  Surgeon: Serafina Mitchell, MD;  Location: [facility];  Service: Vascular;  Laterality: Left;  . LEFT HEART CATH AND CORONARY ANGIOGRAPHY N/A 03/15/2017   Procedure: LEFT HEART CATH AND CORONARY ANGIOGRAPHY;  Surgeon: Nelva Bush, MD;  Location: [facility];  Service:  Cardiovascular;  Laterality: N/A;  . OSTEOTOMY AND ULNAR SHORTENING Right 07/22/2002  . SHOULDER ARTHROSCOPY WITH BICEPSTENOTOMY Right 03/11/2015   Procedure: SHOULDER ARTHROSCOPY WITH BICEPSTENOTOMY;  Surgeon: Ninetta Lights, MD;  Location: [facility];  Service: Orthopedics;  Laterality: Right;  . SHOULDER ARTHROSCOPY WITH DISTAL CLAVICLE RESECTION Left 11/19/2014   Procedure: SHOULDER ARTHROSCOPY WITH DISTAL CLAVICLE RESECTION;  Surgeon:  Kathryne Hitch, MD;  Location: [facility];  Service: Orthopedics;  Laterality: Left;  . SHOULDER ARTHROSCOPY WITH ROTATOR CUFF REPAIR AND SUBACROMIAL DECOMPRESSION Left 11/19/2014   Procedure: LEFT SHOULDER ARTHROSCOPY, DEBRIDEMENT DISTAL CLAVICLE EXCISION, ACROMIOPLASTY WITH ROTATOR CUFF REPAIR ;  Surgeon: Kathryne Hitch, MD;  Location: [facility];  Service: Orthopedics;  Laterality: Left;  . TRANSFORAMINAL LUMBAR INTERBODY FUSION (TLIF) WITH PEDICLE SCREW FIXATION 1 LEVEL N/A 01/03/2018   Procedure: TRANSFORAMINAL LUMBAR INTERBODY FUSION (TLIF) LUMBAR FIVE-SACRAL ONE;  Surgeon: Melina Schools, MD;  Location: [facility];  Service: Orthopedics;  Laterality: N/A;  . VIDEO ASSISTED THORACOSCOPY (VATS)/DECORTICATION Right 11/21/2011   drainage of empyema       Family History  Problem Relation Age of Onset  . Heart attack Father 39  . Alcohol abuse Father   . Aneurysm Mother   . Stroke Neg Hx   . Cancer Neg Hx     Social History   Tobacco Use  . Smoking status: Current Every Day Smoker    Packs/day: 0.25    Years: 20.00    Pack years: 5.00    Types: Cigarettes    Last attempt to quit: 02/19/2019    Years since quitting: 0.9  . Smokeless tobacco: Never Used  . Tobacco comment: 1-2 cigs per day; trying to quit  Vaping Use  . Vaping Use: Never used  Substance Use Topics  . Alcohol use: No    Alcohol/week: 0.0 standard drinks    Comment: sober 1998  . Drug use: No    Home Medications Prior to Admission medications   Medication Sig Start Date End Date Taking? Authorizing Provider  amLODipine (NORVASC) 10 MG tablet TAKE 1 TABLET BY MOUTH EVERY DAY Patient taking differently: Take 10 mg by mouth daily.  09/25/19  Yes Agyei, Caprice Kluver, MD  ascorbic acid (VITAMIN C) 500 MG tablet Take 500 mg by mouth daily.    Yes [provider]  aspirin (BAYER ASPIRIN) 325 MG tablet Take 325 mg by mouth every 6 (six) hours as needed for mild pain.    Yes [provider]  atorvastatin (LIPITOR) 80 MG tablet Take 1 tablet (80 mg total) by mouth daily at 6 PM. 11/06/19  Yes Oda Kilts, MD  chlorthalidone (HYGROTON) 25 MG tablet Take 1 tablet (25 mg total) by mouth daily. 01/08/20  Yes Aslam, Loralyn Freshwater, MD  clopidogrel (PLAVIX) 75 MG tablet Take 1 tablet (75 mg total) by mouth daily. 11/06/19 11/05/20 Yes Oda Kilts, MD  gabapentin (NEURONTIN) 100 MG capsule Take 100 mg by mouth daily.  12/31/19  Yes [provider]  Glycerin-Polysorbate 80 (REFRESH DRY EYE THERAPY OP) Place 1 drop into both eyes 3 (three) times daily as needed (dry/irritated eyes.).   Yes [provider]  Multiple Vitamin (MULTI-VITAMINS) TABS Take 1 tablet by mouth daily.    Yes [provider]  naloxone [facility]) 2 MG/2ML injection Inject 2 mg into the muscle once as needed (opiod overdose (call 911, inject intramuscularly in shoulder or thigh. Repeat every 3 minutes)).    Yes [provider]  oxyCODONE-acetaminophen (PERCOCET) 10-325 MG tablet Take 1 tablet by mouth every 8 (eight) hours as needed for pain.  04/15/19  Yes [provider]  pantoprazole (PROTONIX) 40 MG tablet TAKE 1 TABLET EVERY DAY Patient taking differently: Take 40 mg by mouth daily.  07/28/19  Yes Agyei, Caprice Kluver, MD  predniSONE (DELTASONE) 5 MG tablet TAKE 1 TABLET BY MOUTH EVERY DAY WITH BREAKFAST Patient taking differently: Take 5 mg by mouth daily.  01/22/20  Yes Mosetta Anis, MD  docusate sodium (COLACE) 100 MG capsule Take 1 capsule (100 mg total) by mouth daily. Patient not taking: Reported on 01/22/2020 02/27/19   Welford Roche, MD  nicotine (NICODERM CQ - DOSED IN MG/24 HOURS) 21 mg/24hr patch Place 1 patch (21 mg total) onto the skin daily. Patient not taking: Reported on 01/22/2020 02/28/19   Welford Roche, MD  polyethylene glycol (MIRALAX / GLYCOLAX) 17 g packet Take 17 g by mouth daily as needed for mild constipation. Patient not taking:  Reported on 01/22/2020 02/27/19   Welford Roche, MD  valsartan (DIOVAN) 40 MG tablet Take 1 tablet (40 mg total) by mouth daily. Patient not taking: Reported on 01/22/2020 01/08/20 01/07/21  Harvie Heck, MD    Allergies    Morphine and related, Ramipril, and Tramadol  Review of Systems   Review of Systems  All other systems reviewed and are negative.  All other systems reviewed and are negative for acute changes, except as noted in the HPI.   Physical Exam Updated Vital Signs BP (!) 97/61 (BP Location: Left Arm)   Pulse 91   Temp 98.5 F (36.9 C)   Resp 16   Ht 5' 7.5" (1.715 m)   Wt 79.6 kg   SpO2 94%   BMI 27.08 kg/m   Physical Exam Constitutional:      General: He is not in acute distress.    Appearance: He is not ill-appearing.  HENT:     Head: Normocephalic and atraumatic.     Nose: No congestion or rhinorrhea.     Mouth/Throat:     Mouth: Mucous membranes are moist.  Eyes:     Extraocular Movements: Extraocular movements intact.     Pupils: Pupils are equal, round, and reactive to light.  Cardiovascular:     Rate and Rhythm: Normal rate and regular rhythm.     Pulses:          Femoral pulses are 2+ on the right side and 2+ on the left side.      Popliteal pulses are 2+ on the right side and 2+ on the left side.       Dorsalis pedis pulses are detected w/ Doppler on the right side and 0 on the left side.       Posterior tibial pulses are detected w/ Doppler on the right side and 0 on the left side.     Heart sounds: Normal heart sounds.  Pulmonary:     Effort: Pulmonary effort is normal.     Breath sounds: Normal breath sounds.  Abdominal:     Palpations: Abdomen is soft.     Tenderness: There is no abdominal tenderness.  Musculoskeletal:     Cervical back: Normal range of motion and neck supple.     Right lower leg: No edema.     Left lower leg: No edema.  Skin:    Comments: L foot to mid L shin cold and pale.  Neurological:     Mental Status: He  is alert and oriented to person, place, and time.     Comments: Patient able to wiggle toes. Decreased sensation to light touch in LLE.     ED Results / Procedures / Treatments   Labs (all labs ordered are listed, but only abnormal results are displayed) Labs Reviewed  CBC WITH DIFFERENTIAL/PLATELET - Abnormal; Notable for the following components:      Result Value   Hemoglobin 11.4 (*)    HCT 37.0 (*)    MCH 24.7 (*)    RDW 15.9 (*)    All other components within normal limits  BASIC METABOLIC PANEL - Abnormal; Notable for the following components:   Sodium 134 (*)    Potassium 3.0 (*)    Chloride 94 (*)    Glucose, Bld 108 (*)    All other components within normal limits  I-STAT CHEM 8, ED - Abnormal; Notable for the following components:   Potassium 3.0 (*)    Chloride 93 (*)    Glucose, Bld 104 (*)    Calcium, Ion 1.12 (*)    Hemoglobin 11.9 (*)    HCT 35.0 (*)    All other components within normal limits  SARS CORONAVIRUS 2 BY RT PCR (HOSPITAL ORDER, [facility])  HEPARIN LEVEL (UNFRACTIONATED)  CBC  HEPARIN LEVEL (UNFRACTIONATED)  HEPARIN LEVEL (UNFRACTIONATED)  HEPARIN LEVEL (UNFRACTIONATED)  HEPARIN LEVEL (UNFRACTIONATED)  CBC  CBC  CBC  FIBRINOGEN  FIBRINOGEN  FIBRINOGEN  FIBRINOGEN    EKG None  Radiology CT Angio Aortobifemoral W and/or Wo Contrast  Result Date: 01/22/2020 CLINICAL DATA:  59 year old male with arterial embolism lower extremity. EXAM: CT ANGIOGRAPHY OF ABDOMINAL AORTA WITH ILIOFEMORAL RUNOFF TECHNIQUE: Multidetector CT imaging of the abdomen, pelvis and lower extremities was performed using the standard protocol during bolus administration of intravenous contrast. Multiplanar CT image reconstructions and MIPs were obtained to evaluate the vascular anatomy. CONTRAST:  151mL OMNIPAQUE IOHEXOL 350 MG/ML SOLN COMPARISON:  None. FINDINGS: VASCULAR Aorta: There is advanced atherosclerotic calcification of the abdominal  aorta. No aneurysmal dilatation or dissection. No periaortic inflammation. Celiac: Atherosclerotic calcification of the origin of the celiac axis. The celiac axis and its major branches are patent. SMA: Mild atherosclerotic calcification of the origin of the SMA. The SMA remains patent. Renals: There is atherosclerotic calcification of the origins of the renal arteries. The renal arteries are patent. 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 IMA: Patent without evidence of aneurysm, dissection, vasculitis or significant stenosis. RIGHT Lower Extremity Inflow: Advanced atherosclerotic calcification of the iliac arteries. No aneurysmal dilatation. There is luminal narrowing and irregularity of the right external iliac artery. There is a focal area of partial thrombosis of the distal right external iliac artery at the junction of the common femoral artery (149/5). There is associated focal luminal narrowing. The iliac arteries however remain patent. Outflow: There is mild atherosclerotic calcification of the common femoral, deep and superficial femoral arteries. These arteries remain patent. The popliteal arteries patent. Runoff: The anterior tibial artery is patent. The posterior tibial artery is patent proximally. The distal segments of the posterior tibial artery and fibular artery are not opacified which may be related to timing of the contrast. LEFT Lower Extremity Inflow: Advanced atherosclerotic calcification of the left iliac arteries. No aneurysmal dilatation or dissection. The left iliac arteries are patent. There is diminished flow in the left internal iliac artery. Outflow: The left profundus femoris arteries patent. There is chronic thrombosis of the left superficial femoral  artery. There is a bypass graft extending from the common femoral artery to the left popliteal artery. The bypass graft is completely thrombosed. No flow identified in the popliteal artery. Runoff: There is diminished flow in the  left anterior tibial artery. Some flow is noted in the fibular artery. There is occlusion of the proximal and midportion of the posterior tibial artery. There is reconstitution of some flow in the distal posterior tibial artery. Veins: No obvious venous abnormality within the limitations of this arterial phase study. Review of the MIP images confirms the above findings. NON-VASCULAR Lower chest: Minimal bibasilar atelectasis. The visualized lung bases are otherwise clear. No intra-abdominal free air or free fluid. Hepatobiliary: Fatty liver with probable early changes of cirrhosis. No intrahepatic biliary ductal dilatation. Tiny stones may be present within the gallbladder. No pericholecystic fluid or evidence of acute cholecystitis. There is mild dilatation of the common bile duct measuring up to 14 mm. No definite calcified stone noted in the central CBD. MRCP may provide better evaluation if there is clinical concern for obstruction of the CBD. Pancreas: Small scattered pancreatic calcifications, likely sequela of prior pancreatitis. No active inflammatory changes. Spleen: Normal in size without focal abnormality. Adrenals/Urinary Tract: The adrenal glands unremarkable. There is no hydronephrosis on either side. The visualized ureters and urinary bladder appear unremarkable. Stomach/Bowel: There is sigmoid diverticulosis with scattered colonic diverticula. No active inflammatory changes. There is no bowel obstruction or active inflammation. The appendix is normal. Lymphatic: No adenopathy. Reproductive: The prostate and seminal vesicles are grossly unremarkable. Other: None Musculoskeletal: Bilateral L5 pars defects. L5-S1 disc spacer and posterior fusion. Degenerative changes of the lower lumbar spine. No acute osseous pathology. IMPRESSION: 1. Completely thrombosed bypass graft extending from the left common femoral artery to the left popliteal artery. There is diminished flow in the left lower extremity  arteries with occlusion of the proximal and midportion of the left posterior tibial artery. There is reconstitution of some flow in the distal posterior tibial artery. 2. The right lower extremity arteries are patent to the level of the knee. The right anterior tibial artery is patent. Diminished flow in the right posterior tibial and fibular arteries may be related to timing of the contrast. 3. Fatty liver with probable early changes of cirrhosis. 4. Colonic diverticulosis. No bowel obstruction. Normal appendix. 5. Cholelithiasis. 6. Mild dilatation of the common bile duct measuring up to 14 mm. No definite calcified stone noted in the central CBD. MRCP may provide better evaluation if there is clinical concern for obstruction of the CBD. Electronically Signed   By: Anner Crete M.D.   On: 01/22/2020 21:21   HYBRID OR IMAGING ([facility] ONLY)  Result Date: 01/22/2020 There is no interpretation for this exam.  This order is for images obtained during a surgical procedure.  Please See "Surgeries" Tab for more information regarding the procedure.    Medications Ordered in ED Medications  ceFAZolin (ANCEF) 2-4 GM/100ML-% IVPB (has no administration in time range)  amLODipine (NORVASC) tablet 10 mg (has no administration in time range)  aspirin tablet 325 mg (has no administration in time range)  atorvastatin (LIPITOR) tablet 80 mg (has no administration in time range)  clopidogrel (PLAVIX) tablet 75 mg (has no administration in time range)  sodium chloride flush (NS) 0.9 % injection 3 mL (has no administration in time range)  sodium chloride flush (NS) 0.9 % injection 3 mL (has no administration in time range)  0.9 %  sodium chloride infusion (has no administration  in time range)  heparin ADULT infusion 100 units/mL (25000 units/242mL sodium chloride 0.45%) (800 Units/hr Intravenous New Bag/Given 01/23/20 0025)  midazolam (VERSED) injection 1 mg (has no administration in time range)  ondansetron (ZOFRAN)  injection 4 mg (has no administration in time range)  alteplase (LIMB ISCHEMIA) 10 mg in normal saline (0.02 mg/mL) infusion (1 mg/hr Intracatheter New Bag/Given 01/23/20 0034)  labetalol (NORMODYNE) injection 10 mg (has no administration in time range)  hydrALAZINE (APRESOLINE) injection 5 mg (has no administration in time range)  oxyCODONE (Oxy IR/ROXICODONE) immediate release tablet 5 mg (has no administration in time range)  fentaNYL (SUBLIMAZE) injection 25-50 mcg (has no administration in time range)  HYDROmorphone (DILAUDID) injection 1 mg (1 mg Intravenous Given 01/22/20 1953)  iohexol (OMNIPAQUE) 350 MG/ML injection 100 mL (100 mLs Intravenous Contrast Given 01/22/20 2040)  heparin bolus via infusion 5,500 Units (5,500 Units Intravenous Bolus from Bag 01/22/20 2122)    ED Course  I have reviewed the triage vital signs and the nursing notes.  Pertinent labs & imaging results that were available during my care of the patient were reviewed by me and considered in my medical decision making (see chart for details).    MDM Rules/Calculators/A&P                          [name] [name] is a 59 y.o. man with history of peripheral arterial disease s/p femoral to below-knee popliteal artery bypass graft of his left lower extremity (02/2019), rheumatoid arthritis, HTN, and tobacco abuse who presents with a painful and cold L foot pain.  The patient is non-toxic appearing with stable vitals. Unable to find DP or PT pulses in the LLE with pencil doppler. Left foot is cold and pale. Presentation concerning for critical limb ischemia. Will obtain basic labs, treat pain, consult vascular surgery.  - Vascular surgery requesting CT angio aortobifemoral wwo contrast. - CBC with hemoglbin of 11.4. BMP unremarkable.  - CT angio aortobifemoral wwo contrast demonstrates occluded left common femoral to below-knee popliteal prosthetic bypass. - Patient to be admitted for vascular surgery  intervention.   Final Clinical Impression(s) / ED Diagnoses Final diagnoses:  Ischemic leg    Rx / DC Orders ED Discharge Orders    None       Alexandria Lodge, MD 01/23/20 4008    Drenda Freeze, MD 01/25/20 [phone]

## 2020-01-22 NOTE — Op Note (Signed)
    Patient name: [name] [name] MRN: [id number] DOB: Feb 03, 1961 Sex: male  01/22/2020 Pre-operative Diagnosis: Acute on chronic limb ischemia of the left lower extremity with occluded common femoral to below-knee popliteal prosthetic bypass Post-operative diagnosis:  Same Surgeon:  Marty Heck, MD Procedure Performed: 1.  Ultrasound-guided access of right common femoral artery 2.  Catheter selection of aorta 3.  Left lower extremity arteriogram with selection of third order branches 4.  Placement of thrombolytic's catheter in the left common femoral to below-knee popliteal artery prosthetic bypass with initiation of catheter directed thrombolytics  Indications: 59 year old male who presented to the ED with 24 hours of acute left leg pain, coolness, and numbness.  Ultimately was found to have occluded left common femoral to below-knee popliteal prosthetic bypass.  He presents tonight for initiation of thrombolysis for attempted bypass and limb salvage.  Discussed high risk for limb loss.    Findings:   Aortogram was deferred since he had a CTA performed in the ED.  Left lower extremity arteriogram showed patent common femoral and profunda but occlusion of his left common femoral to below-knee popliteal prosthetic bypass.  Ultimately the occluded bypass was cannulated and I was able to cross into the native below-knee popliteal artery distally and get my wire into the anterior tibial.  In the past has had single vessel runoff via anterior tibial but this appears occluded distally with a peroneal that reconstitutes.  The UniFuse thrombolytic's catheter was placed for initiation of thrombolysis and he will be transported to the ICU.   Procedure:  The patient was identified in the holding area and taken to OR 16.  General endotracheal anesthesia was induced.  That point in time both groins and left leg were prepped and draped in standard sterile fashion.  After timeout was performed and got  antibiotics initially was ultrasound to evaluate the right common femoral artery, it was patent and image was saved.  The right common femoral artery was accessed with a micro access needle placed a microwire and then a micro sheath.  Then used a Glidewire advantage and exchanged for a 5 French sheath in the right groin.  Then used an Omni Flush catheter to cross the aortic bifurcation and got a wire into the left common femoral to below-knee popliteal prosthetic bypass.  That point time week exchanged for a 6 French Ansell sheath in the right groin over the aortic bifurcation.  Patient was given additional 4000 units IV heparin.  I then used a quick cross catheter with the Glidewire advantage as well as a V 18 to cross the occluded bypass into the native below-knee popliteal artery and got my wire down the anterior tibial.  I did perform hand-injection that showed the proximal anterior tibial was patent but distally it looked occluded and then there was a collateral reconstituting the peroneal.  In the past has had dominant runoff via anterior tibial all the way down to the ankle.  That point time satisfied with our wire placement we placed a long 50 cm length UniFuse catheter into the occluded bypass.  The inner cannula was placed in the Unifuse catheter.  The sheath was secured with multiple 3-0 silk sutures.  Thrombolysis was then initiated.  tPA will run at 1 mg an hour and heparin 800 units an hour through the sheath.  Complication: None  Condition: Stable  Marty Heck, MD [facility] Office: [phone]

## 2020-01-23 ENCOUNTER — Encounter (HOSPITAL_COMMUNITY): Payer: Self-pay | Admitting: Vascular Surgery

## 2020-01-23 ENCOUNTER — Other Ambulatory Visit: Payer: Self-pay

## 2020-01-23 ENCOUNTER — Inpatient Hospital Stay (HOSPITAL_COMMUNITY)
Admission: EM | Disposition: A | Discharge status: Home / Self Care | Payer: Self-pay | Source: Home / Self Care | Attending: Vascular Surgery

## 2020-01-23 DIAGNOSIS — I998 Other disorder of circulatory system: Secondary | ICD-10-CM | POA: Diagnosis present

## 2020-01-23 HISTORY — PX: PERIPHERAL VASCULAR BALLOON ANGIOPLASTY: CATH118281

## 2020-01-23 HISTORY — PX: PERIPHERAL VASCULAR THROMBECTOMY: CATH118306

## 2020-01-23 HISTORY — PX: PERIPHERAL VASCULAR ATHERECTOMY: CATH118256

## 2020-01-23 LAB — CBC
HCT: 33.6 % — ABNORMAL LOW (ref 39.0–52.0)
HCT: 38.1 % — ABNORMAL LOW (ref 39.0–52.0)
Hemoglobin: 10.7 g/dL — ABNORMAL LOW (ref 13.0–17.0)
Hemoglobin: 11.7 g/dL — ABNORMAL LOW (ref 13.0–17.0)
MCH: 25.4 pg — ABNORMAL LOW (ref 26.0–34.0)
MCH: 24.4 pg — ABNORMAL LOW (ref 26.0–34.0)
MCHC: 31.8 g/dL (ref 30.0–36.0)
MCHC: 30.7 g/dL (ref 30.0–36.0)
MCV: 79.6 fL — ABNORMAL LOW (ref 80.0–100.0)
MCV: 79.5 fL — ABNORMAL LOW (ref 80.0–100.0)
Platelets: 220 10*3/uL (ref 150–400)
Platelets: 191 10*3/uL (ref 150–400)
RBC: 4.22 MIL/uL (ref 4.22–5.81)
RBC: 4.79 MIL/uL (ref 4.22–5.81)
RDW: 16 % — ABNORMAL HIGH (ref 11.5–15.5)
RDW: 15.9 % — ABNORMAL HIGH (ref 11.5–15.5)
WBC: 9.2 10*3/uL (ref 4.0–10.5)
WBC: 7.3 10*3/uL (ref 4.0–10.5)
nRBC: 0 % (ref 0.0–0.2)
nRBC: 0 % (ref 0.0–0.2)

## 2020-01-23 LAB — HEPARIN LEVEL (UNFRACTIONATED)
Heparin Unfractionated: 0.22 IU/mL — ABNORMAL LOW (ref 0.30–0.70)
Heparin Unfractionated: <0.1 IU/mL — ABNORMAL LOW (ref 0.30–0.70)

## 2020-01-23 LAB — FIBRINOGEN
Fibrinogen: 546 mg/dL — ABNORMAL HIGH (ref 210–475)
Fibrinogen: 576 mg/dL — ABNORMAL HIGH (ref 210–475)
Fibrinogen: 581 mg/dL — ABNORMAL HIGH (ref 210–475)

## 2020-01-23 LAB — BASIC METABOLIC PANEL
Anion gap: 11 (ref 5–15)
BUN: 13 mg/dL (ref 6–20)
CO2: 28 mmol/L (ref 22–32)
Calcium: 9.1 mg/dL (ref 8.9–10.3)
Chloride: 96 mmol/L — ABNORMAL LOW (ref 98–111)
Creatinine, Ser: 0.85 mg/dL (ref 0.61–1.24)
GFR calc Af Amer: >60 mL/min (ref >60)
GFR calc non Af Amer: >60 mL/min (ref >60)
Glucose, Bld: 173 mg/dL — ABNORMAL HIGH (ref 70–99)
Potassium: 3.4 mmol/L — ABNORMAL LOW (ref 3.5–5.1)
Sodium: 135 mmol/L (ref 135–145)

## 2020-01-23 LAB — POCT ACTIVATED CLOTTING TIME
Activated Clotting Time: 136 seconds
Activated Clotting Time: 213 seconds
Activated Clotting Time: 224 seconds
Activated Clotting Time: 213 seconds
Activated Clotting Time: 224 seconds

## 2020-01-23 SURGERY — PERIPHERAL VASCULAR THROMBECTOMY
Anesthesia: LOCAL

## 2020-01-23 MED ORDER — HYDRALAZINE HCL 20 MG/ML IJ SOLN: 5.0000 mg | INTRAMUSCULAR | Status: DC | PRN

## 2020-01-23 MED ORDER — AMLODIPINE BESYLATE 10 MG PO TABS
10.0000 mg | ORAL_TABLET | Freq: Every day | ORAL | Status: DC
  Administered 2020-01-23 – 2020-01-24 (×2): 10 mg via ORAL
  Filled 2020-01-23 (×2): qty 1

## 2020-01-23 MED ORDER — CLOPIDOGREL BISULFATE 75 MG PO TABS
75.0000 mg | ORAL_TABLET | Freq: Every day | ORAL | Status: DC
  Administered 2020-01-23 – 2020-01-24 (×2): 75 mg via ORAL
  Filled 2020-01-23 (×2): qty 1

## 2020-01-23 MED ORDER — SODIUM CHLORIDE 0.9% FLUSH: 3.0000 mL | INTRAVENOUS | Status: DC | PRN

## 2020-01-23 MED ORDER — SODIUM CHLORIDE 0.9 % IV SOLN: INTRAVENOUS | Status: AC

## 2020-01-23 MED ORDER — IODIXANOL 320 MG/ML IV SOLN
INTRAVENOUS | Status: DC | PRN
  Administered 2020-01-23: 130 mL

## 2020-01-23 MED ORDER — MIDAZOLAM HCL 2 MG/2ML IJ SOLN
INTRAMUSCULAR | Status: DC | PRN
  Administered 2020-01-23: 2 mg via INTRAVENOUS

## 2020-01-23 MED ORDER — FENTANYL CITRATE (PF) 100 MCG/2ML IJ SOLN
INTRAMUSCULAR | Status: AC
  Filled 2020-01-23: qty 2

## 2020-01-23 MED ORDER — HEPARIN SODIUM (PORCINE) 1000 UNIT/ML IJ SOLN
INTRAMUSCULAR | Status: AC
  Filled 2020-01-23: qty 1

## 2020-01-23 MED ORDER — SODIUM CHLORIDE 0.9 % IV SOLN: 250.0000 mL | INTRAVENOUS | Status: DC | PRN

## 2020-01-23 MED ORDER — HEPARIN (PORCINE) 25000 UT/250ML-% IV SOLN
1200.0000 [IU]/h | INTRAVENOUS | Status: AC
  Administered 2020-01-23 – 2020-01-24 (×2): 1000 [IU]/h via INTRAVENOUS
  Filled 2020-01-23: qty 250

## 2020-01-23 MED ORDER — ASPIRIN 325 MG PO TABS: 325.0000 mg | ORAL_TABLET | Freq: Four times a day (QID) | ORAL | Status: DC | PRN

## 2020-01-23 MED ORDER — CLOPIDOGREL BISULFATE 75 MG PO TABS
75.0000 mg | ORAL_TABLET | Freq: Every day | ORAL | Status: DC
Start: 2020-01-24 — End: 2020-01-23

## 2020-01-23 MED ORDER — FENTANYL CITRATE (PF) 100 MCG/2ML IJ SOLN
INTRAMUSCULAR | Status: DC | PRN
  Administered 2020-01-23: 50 ug via INTRAVENOUS
  Administered 2020-01-23: 25 ug via INTRAVENOUS
  Administered 2020-01-23 (×2): 50 ug via INTRAVENOUS

## 2020-01-23 MED ORDER — HEPARIN (PORCINE) IN NACL 1000-0.9 UT/500ML-% IV SOLN
INTRAVENOUS | Status: AC
  Filled 2020-01-23: qty 1000

## 2020-01-23 MED ORDER — ONDANSETRON HCL 4 MG/2ML IJ SOLN: 4.0000 mg | Freq: Four times a day (QID) | INTRAMUSCULAR | Status: DC | PRN

## 2020-01-23 MED ORDER — LABETALOL HCL 5 MG/ML IV SOLN: 10.0000 mg | INTRAVENOUS | Status: DC | PRN

## 2020-01-23 MED ORDER — MIDAZOLAM HCL 2 MG/2ML IJ SOLN
INTRAMUSCULAR | Status: AC
  Filled 2020-01-23: qty 2

## 2020-01-23 MED ORDER — SODIUM CHLORIDE 0.9% FLUSH
3.0000 mL | Freq: Two times a day (BID) | INTRAVENOUS | Status: DC
  Administered 2020-01-23 (×2): 3 mL via INTRAVENOUS

## 2020-01-23 MED ORDER — SODIUM CHLORIDE 0.9 % IV SOLN
1.0000 mg/h | INTRAVENOUS | Status: DC
  Administered 2020-01-23 (×2): 1 mg/h
  Filled 2020-01-23 (×5): qty 10

## 2020-01-23 MED ORDER — OXYCODONE HCL 5 MG PO TABS
5.0000 mg | ORAL_TABLET | ORAL | Status: DC | PRN
  Administered 2020-01-23 – 2020-01-24 (×7): 5 mg via ORAL
  Filled 2020-01-23 (×7): qty 1

## 2020-01-23 MED ORDER — HEPARIN (PORCINE) IN NACL 1000-0.9 UT/500ML-% IV SOLN
INTRAVENOUS | Status: DC | PRN
  Administered 2020-01-23: 500 mL

## 2020-01-23 MED ORDER — HEPARIN SODIUM (PORCINE) 1000 UNIT/ML IJ SOLN
INTRAMUSCULAR | Status: DC | PRN
  Administered 2020-01-23 (×2): 3000 [IU] via INTRAVENOUS
  Administered 2020-01-23: 2000 [IU] via INTRAVENOUS
  Administered 2020-01-23: 3000 [IU] via INTRAVENOUS
  Administered 2020-01-23: 6000 [IU] via INTRAVENOUS

## 2020-01-23 MED ORDER — POTASSIUM CHLORIDE CRYS ER 20 MEQ PO TBCR
60.0000 meq | EXTENDED_RELEASE_TABLET | Freq: Once | ORAL | Status: AC
  Administered 2020-01-23: 60 meq via ORAL
  Filled 2020-01-23: qty 3

## 2020-01-23 MED ORDER — SODIUM CHLORIDE 0.9 % IV SOLN
250.0000 mL | INTRAVENOUS | Status: DC | PRN
  Administered 2020-01-23: 250 mL via INTRAVENOUS

## 2020-01-23 MED ORDER — CHLORHEXIDINE GLUCONATE CLOTH 2 % EX PADS
6.0000 | MEDICATED_PAD | Freq: Every day | CUTANEOUS | Status: DC
  Administered 2020-01-23: 6 via TOPICAL

## 2020-01-23 MED ORDER — LIDOCAINE HCL (PF) 1 % IJ SOLN
INTRAMUSCULAR | Status: DC | PRN
  Administered 2020-01-23: 15 mL

## 2020-01-23 MED ORDER — HEPARIN (PORCINE) 25000 UT/250ML-% IV SOLN
1000.0000 [IU]/h | INTRAVENOUS | Status: DC
  Administered 2020-01-23: 800 [IU]/h via INTRAVENOUS
  Filled 2020-01-23: qty 250

## 2020-01-23 MED ORDER — HEPARIN (PORCINE) IN NACL 1000-0.9 UT/500ML-% IV SOLN
INTRAVENOUS | Status: AC
  Filled 2020-01-23: qty 500

## 2020-01-23 MED ORDER — SODIUM CHLORIDE 0.9 % IV SOLN
INTRAVENOUS | Status: AC | PRN
  Administered 2020-01-23: 10 mL/h via INTRAVENOUS

## 2020-01-23 MED ORDER — MIDAZOLAM HCL 2 MG/2ML IJ SOLN: 1.0000 mg | INTRAMUSCULAR | Status: DC | PRN

## 2020-01-23 MED ORDER — ACETAMINOPHEN 325 MG PO TABS: 650.0000 mg | ORAL_TABLET | ORAL | Status: DC | PRN

## 2020-01-23 MED ORDER — SODIUM CHLORIDE 0.9% FLUSH: 3.0000 mL | Freq: Two times a day (BID) | INTRAVENOUS | Status: DC

## 2020-01-23 MED ORDER — ATORVASTATIN CALCIUM 80 MG PO TABS
80.0000 mg | ORAL_TABLET | Freq: Every day | ORAL | Status: DC
  Administered 2020-01-23: 80 mg via ORAL
  Filled 2020-01-23: qty 1

## 2020-01-23 MED ORDER — FENTANYL CITRATE (PF) 100 MCG/2ML IJ SOLN: 25.0000 ug | INTRAMUSCULAR | Status: DC | PRN

## 2020-01-23 SURGICAL SUPPLY — 25 items
BAG SNAP BAND KOVER 36X36 (MISCELLANEOUS) ×2 IMPLANT
BALLN MUSTANG 4X60X135 (BALLOONS) ×3
BALLN STERLING OTW 3X220X150 (BALLOONS) ×3
BALLN STERLING OTW 6X220X150 (BALLOONS) ×3
BALLOON MUSTANG 4X60X135 (BALLOONS) IMPLANT
BALLOON STERLING OTW 3X220X150 (BALLOONS) IMPLANT
BALLOON STERLING OTW 6X220X150 (BALLOONS) IMPLANT
CANISTER PENUMBRA ENGINE (MISCELLANEOUS) ×2 IMPLANT
CATH AURYON 5FR ATHEREC 1.5 (CATHETERS) ×1 IMPLANT
CATH AURYON 6FR ATHEREC 2.0 (CATHETERS) ×1 IMPLANT
CATH BEACON 5.038 65CM KMP-01 (CATHETERS) ×1 IMPLANT
CATH CXI 4F 150 ST (CATHETERS) ×1 IMPLANT
CATH INDIGO CAT6 KIT (CATHETERS) ×1 IMPLANT
CATH QUICKCROSS .018X135CM (MICROCATHETER) ×1 IMPLANT
COVER DOME SNAP 22 D (MISCELLANEOUS) ×1 IMPLANT
DEVICE CLOSURE MYNXGRIP 6/7F (Vascular Products) ×1 IMPLANT
KIT ENCORE 26 ADVANTAGE (KITS) ×1 IMPLANT
PROTECTION STATION PRESSURIZED (MISCELLANEOUS) ×3
SHEATH PINNACLE ST 7F 45CM (SHEATH) ×1 IMPLANT
STATION PROTECTION PRESSURIZED (MISCELLANEOUS) IMPLANT
STENT TIGRIS 5X60X120 (Permanent Stent) ×1 IMPLANT
TRAY PV CATH (CUSTOM PROCEDURE TRAY) ×1 IMPLANT
WIRE G V18X300CM (WIRE) ×1 IMPLANT
WIRE HI TORQ VERSACORE 300 (WIRE) ×1 IMPLANT
WIRE SPARTACORE .014X300CM (WIRE) ×4 IMPLANT

## 2020-01-23 NOTE — Transfer of Care (Signed)
Immediate Anesthesia Transfer of Care Note  Patient: [name] [name]  Procedure(s) Performed: INTRA OPERATIVE ARTERIOGRAM with administration of thrombolyics in Left femoral to popliteal bypass. (Left Leg Upper) ULTRASOUND GUIDANCE FOR VASCULAR ACCESS Right Common Femoral Artery. (Right Groin)  Patient Location: PACU  Anesthesia Type:General  Level of Consciousness: oriented, sedated, drowsy, patient cooperative and responds to stimulation  Airway & Oxygen Therapy: Patient Spontanous Breathing and Patient connected to nasal cannula oxygen  Post-op Assessment: Report given to RN, Post -op Vital signs reviewed and stable and Patient moving all extremities X 4  Post vital signs: Reviewed and stable  Last Vitals:  Vitals Value Taken Time  BP    Temp    Pulse    Resp    SpO2      Last Pain:  Vitals:   01/22/20 2024  TempSrc:   PainSc: 4          Complications: No complications documented.

## 2020-01-23 NOTE — Progress Notes (Signed)
   01/23/20 1546  Assess: MEWS Score  Temp 98 F (36.7 C)  BP 105/75  Pulse Rate 80  Resp 12  Level of Consciousness Alert  SpO2 94 %  O2 Device Room Air  Assess: MEWS Score  MEWS Temp 0  MEWS Systolic 0  MEWS Pulse 0  MEWS RR 1  MEWS LOC 0  MEWS Score 1  MEWS Score Color Green  Patient with previous Yellow MEWS score while in cath lab. Patient brought to floor, vital signs stable as documented. See post-op assessments.

## 2020-01-23 NOTE — Progress Notes (Signed)
[facility] for Heparin Indication: Ischemic Leg   Allergies  Allergen Reactions  . Morphine And Related Rash  . Ramipril Rash    Per patient on R arm and leg only; no angioedema  . Tramadol Itching and Rash    Patient Measurements: Height: 5' 7.5" (171.5 cm) Weight: 78.7 kg (173 lb 8 oz) IBW/kg (Calculated) : 67.25 Heparin Dosing Weight: 79.6 kg  Vital Signs: Temp: 98 F (36.7 C) (07/30 0813) Temp Source: Oral (07/30 1150) BP: 109/83 (07/30 1456) Pulse Rate: 94 (07/30 1456)  Labs: Recent Labs    01/22/20 1937 01/22/20 1937 01/22/20 1959 01/22/20 1959 01/23/20 0107 01/23/20 0735  HGB 11.4*   < > 11.9*   < > 10.7* 11.7*  HCT 37.0*   < > 35.0*  --  33.6* 38.1*  PLT 207  --   --   --  220 191  HEPARINUNFRC  --   --   --   --  0.22* <0.10*  CREATININE 1.02  --  1.10  --   --  0.85   < > = values in this interval not displayed.    Estimated Creatinine Clearance: 89.1 mL/min (by C-G formula based on SCr of 0.85 mg/dL).   Medical History: Past Medical History:  Diagnosis Date  . Anemia   . Anemia 08/06/2012  . 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 Community acquired pneumonia 11/07/2011  . DDD (degenerative disc disease), lumbosacral     and grade 2 slip  . GERD (gastroesophageal reflux disease)   . Heart murmur   . History of anemia    no current med.  . Hypertension    states is borderline on medication; has been on med. x 5-6 yr.  . Hypokalemia 05/30/2018  . Impingement syndrome of shoulder region 10/2014   left  . Insomnia 06/01/2015  . Ischemic ulcer of toe of left foot ([place])   . Left shoulder pain 12/16/2015  . Low back pain without sciatica 10/29/2009  . Lupus ([place])   . Peripheral vascular disease ([place])   . Pneumonia   . RA (rheumatoid arthritis) ([place])   . Rotator cuff tear 11/04/2014    Assessment: Patient is a 59 yr old male that presented to the ED with an ischemic left leg. The patient was on aspirin and Plavix PTA.  Pharmacy has been asked to dose heparin at this time.   He was on catheter directed lysis earlier today prior to vascular procedure, with heparin infusing at 1000 units/hr prior to vascular procedure today. Heparin was interrupted for procedure and alteplase has been discontinued; pharmacy is consulted to resume heparin 6 hrs after sheath pulled (per procedure log, sheath was pulled ~1500 PM this afternoon).   H/H 11.7/38.1, platelets 191; fibrinogen 576 earlier today, 581 this afternoon after procedure  Goal of Therapy:  Heparin level 0.3-0.7 units/ml (goal is 0.3-0.5 units/ml for next 24 hrs) Monitor platelets by anticoagulation protocol: Yes   Plan:  Restart heparin infusion at 1000 units/hr 6 hrs after sheath pulled (restart at 2100 tonight), with heparin level goal of 0.3-0.5 units/ml for next 24 hrs  Check 6-hr heparin level after restarting heparin infusion Monitor daily heparin level, CBC Follow fibrinogen (ordered Q 6 hrs X 24 hrs post procedure)  Gillermina Hu, PharmD, BCPS, [facility] Clinical Pharmacist 01/23/20, 15:36 PM

## 2020-01-23 NOTE — Progress Notes (Signed)
[facility] [place]  Subjective  - no complaints.  Tolerated thrombolysis left leg overnight.   Objective 116/84 68 98 F (36.7 C) (Oral) 16 97%  Intake/Output Summary (Last 24 hours) at 01/23/2020 1218 Last data filed at 01/23/2020 0800 Gross per 24 hour  Intake 1297.76 ml  Output 1328 ml  Net -30.24 ml    Left AT signal monophasic Sheath in right groin Left foot motor intact  Laboratory Lab Results: Recent Labs    01/23/20 0107 01/23/20 0735  WBC 9.2 7.3  HGB 10.7* 11.7*  HCT 33.6* 38.1*  PLT 220 191   BMET Recent Labs    01/22/20 1937 01/22/20 1937 01/22/20 1959 01/23/20 0735  NA 134*   < > 138 135  K 3.0*   < > 3.0* 3.4*  CL 94*   < > 93* 96*  CO2 28  --   --  28  GLUCOSE 108*   < > 104* 173*  BUN 15   < > 16 13  CREATININE 1.02   < > 1.10 0.85  CALCIUM 8.9  --   --  9.1   < > = values in this interval not displayed.    COAG Lab Results  Component Value Date   INR 1.0 02/26/2019   INR 1.0 02/19/2019   INR 1.0 03/13/2017   No results found for: PTT  Assessment/Planning:  Thrombolytics catheter check of left lower extremity for occluded left fem pop bypass.  Has monophasic AT signal this afternoon after lysis.   Marty Heck 01/23/2020 12:18 PM --

## 2020-01-23 NOTE — Op Note (Signed)
Patient name: [name] [name] MRN: [id number] DOB: 1960-07-24 Sex: male  01/22/2020 - 01/23/2020 Pre-operative Diagnosis: Acute on chronic limb ischemia of the left lower extremity with occluded left common femoral to below-knee popliteal prosthetic bypass Post-operative diagnosis:  Same Surgeon:  Marty Heck, MD Procedure Performed: 1.  Thrombolytics catheter check of the left lower extremity with thrombolysis catheter in the left common femoral to below-knee popliteal prosthetic bypass 2.  Laser atherectomy of the left common femoral to below-knee popliteal prosthetic bypass (2.0 mm Auryon laser) 3.  Laser atherectomy of the native below-knee popliteal artery and anterior tibial artery (1.5 mm Auryon laser) 4.  Balloon angioplasty of left common femoral to below-knee popliteal bypass with 6 mm angioplasty balloon 5.  Balloon angioplasty of the left native below-knee popliteal artery and anterior tibial artery with a 3 mm angioplasty balloon 6.  Penumbra percutaneous mechanical thrombectomy of left common femoral to below-knee popliteal prosthetic bypass 7.  Penumbra percutaneous mechanical thrombectomy of the left profunda femoris artery  8.  Stent of the distal popliteal prosthetic bypass into the below-knee native popliteal artery (5 mm x 60 mm Tigris)  9.  144 minutes of monitored moderate conscious sedation time 10.  Mynx closure of the right common femoral artery  Indications: Patient is a 59 year old male who previous underwent a left common femoral to below-knee popliteal prosthetic bypass by Dr. Trula Slade.  He presented to the ED last night with acute on chronic limb ischemia and noted occlusion of his bypass.  Thrombolytics catheter was placed in his bypass last night and he presents today for thrombolytics catheter check.  Findings:   Initial left leg arteriogram showed a patent common femoral to below-knee prosthetic bypass but there was residual thrombus that appeared  flow-limiting in the proximal portion of the bypass as well as additional thrombus in the distal bypass at the below-knee popliteal anastomosis.  Ultimately laser atherectomy was performed of the common femoral to below-knee popliteal prosthetic bypass with a 2.0 mm Auryon laser and then the native below-knee popliteal artery and anterior tibial were treated with laser arthrectomy with 1.5 mm Auryon laser catheter.  I then ballooned the bypass with a 6 mm angioplasty balloon including at the anastomosis and then treated the native below-knee popliteal artery and anterior tibial artery with a 3 mm angioplasty balloon.  During proximal angioplasty of the bypass we embolized clot down the left profunda distally.  Ultimately a penumbra cat 6 catheter was used to retrieve most of this thrombus with filling of all the distal branches.  I then ran the penumbra cat 6 down the bypass into the native below-knee popliteal artery given there was some residual thrombus.  Appeared to be a tight stenosis at the below-knee popliteal anastomosis and this was treated with 5 mm x 60 mm Tigris stent.  Patient now has a widely patent left common femoral below-knee popliteal prosthetic bypass with inline flow down the anterior tibial.  Should be noted that last night prior to thrombolysis the anterior tibial was occluded distally in the setting of single vessel runoff.   Procedure:  The patient was identified in the holding area and taken to room 8.  The patient was then placed supine on the table and prepped and draped in the usual sterile fashion.  A time out was called.  Initially checked an ACT and additional heparin was given throughout the case to try maintain ACT greater than 250.  A V 18 wire was placed down the  UniFuse catheter that been parked in the anterior tibial in the left leg and the Unifuse catheter was removed.  We then performed hand-injection through the sheath in the right groin over the aortic bifurcation and this  showed that there was now flow down the left common femoral below-knee prosthetic bypass but residual thrombus both in the proximal third of the bypass and distally at the anastomosis.  In addition did not have an anterior tibial identified on initial imaging.  I then performed laser arthrectomy of the bypass with a 2.0 mm Auryon laser with aspiration capabilities at 19 MHz and 60 MHz and then switched to a 1.5 mm Auryon laser at 62 MHz and 60 MHz in the native below-knee popliteal artery in the anterior tibial.  I then ballooned the bypass with a 6 mm angioplasty balloon throughout its entire segment in the native below-knee popliteal artery and anterior tibial was angioplastied with a 3 mm balloon.  Another injection at this time showed excellent inline flow down the bypass although appeared sluggish at the distal anastomosis suggesting a stenosis where we had trouble crossing with our wire.  In addition I noted that there was now thrombus in the profunda on the left and I assumed this was embolization from angioplasty of the proximal bypass.  We then upsized to a 7 [place] destination sheath and used a penumbra CAT 6 for percutaneous mechanical thrombectomy over a V 14 [place] core that was placed in the left profunda.  We had to make a multitude of passes and ultimately had an EBL of approximately 500 mL. Ultimately we were able to reestablish flow in the distal profunda branches. I also ran the penumbra catheter down the left common femoral to below-knee popliteal bypass given there was some residual thrombus noted on arteriogram.  Satisfied with those results we then went back down and focused on the bypass and it became apparent that there was an issue at the below-knee popliteal anastomosis.  We then selected a 5 mm x 60 mm Tiger stent that was deployed across the anastomosis from the below-knee popliteal artery across the anastomosis into the bypass after exchanging for an 0.035 system  This was post  angioplasty with a 4 mm balloon.  He now has excellent inline flow down the left lower extremity the patent bypass and flow down the anterior tibial filling of the dorsalis pedis.  Satisfied with results wires and catheters were removed and a mynx closure device was deployed in the right common femoral artery.  Plan: Patient to continue Plavix and heparin post-op.   Marty Heck, MD [facility] [place] Office: [phone]

## 2020-01-23 NOTE — Progress Notes (Signed)
[facility] for Heparin Indication: Ischemic Leg   Allergies  Allergen Reactions  . Morphine And Related Rash  . Ramipril Rash    Per patient on R arm and leg only; no angioedema  . Tramadol Itching and Rash    Patient Measurements: Height: 5' 7.5" (171.5 cm) Weight: 78.7 kg (173 lb 8 oz) IBW/kg (Calculated) : 67.25 Heparin Dosing Weight: 79.6 kg  Vital Signs: Temp: 98 F (36.7 C) (07/30 0813) Temp Source: Oral (07/30 0813) BP: 130/84 (07/30 0700) Pulse Rate: 73 (07/30 0700)  Labs: Recent Labs    01/22/20 1937 01/22/20 1937 01/22/20 1959 01/22/20 1959 01/23/20 0107 01/23/20 0735  HGB 11.4*   < > 11.9*   < > 10.7* 11.7*  HCT 37.0*   < > 35.0*  --  33.6* 38.1*  PLT 207  --   --   --  220 191  HEPARINUNFRC  --   --   --   --  0.22* <0.10*  CREATININE 1.02  --  1.10  --   --  0.85   < > = values in this interval not displayed.    Estimated Creatinine Clearance: 89.1 mL/min (by C-G formula based on SCr of 0.85 mg/dL).   Medical History: Past Medical History:  Diagnosis Date  . Anemia   . Anemia 08/06/2012  . 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 Community acquired pneumonia 11/07/2011  . DDD (degenerative disc disease), lumbosacral     and grade 2 slip  . GERD (gastroesophageal reflux disease)   . Heart murmur   . History of anemia    no current med.  . Hypertension    states is borderline on medication; has been on med. x 5-6 yr.  . Hypokalemia 05/30/2018  . Impingement syndrome of shoulder region 10/2014   left  . Insomnia 06/01/2015  . Ischemic ulcer of toe of left foot ([place])   . Left shoulder pain 12/16/2015  . Low back pain without sciatica 10/29/2009  . Lupus ([place])   . Peripheral vascular disease ([place])   . Pneumonia   . RA (rheumatoid arthritis) ([place])   . Rotator cuff tear 11/04/2014    Medications:  Scheduled:  . amLODipine  10 mg Oral Daily  . atorvastatin  80 mg Oral q1800  . Chlorhexidine Gluconate Cloth  6 each  Topical Daily  . clopidogrel  75 mg Oral Daily  . sodium chloride flush  3 mL Intravenous Q12H    Assessment: Patient is a 56 yom that presents to the ED with an inschemic left leg. The patient is on aspirn and plavix PTA. Pharmacy has been asked to dose heparin at this time.   He is now on catheter directed lysis with heparin running at 800 units/hr (prior rate was 1350 units/hr). Plans for back to OR today -heparin level > 0.1, hg= 11.7, fibrinogen= 576  Goal of Therapy:  Heparin level 0.3-0.7 units/ml Monitor platelets by anticoagulation protocol: Yes   Plan:  -Increase heparin to 1000 units/hr -Heparin level in 6 hrs with CBC  Hildred Laser, PharmD Clinical Pharmacist **Pharmacist phone directory can now be found on [email or web address] ([email or web address]).  Listed under [place].

## 2020-01-23 NOTE — Anesthesia Postprocedure Evaluation (Signed)
Anesthesia Post Note  Patient: [name] [name]  Procedure(s) Performed: INTRA OPERATIVE ARTERIOGRAM with administration of thrombolyics in Left femoral to popliteal bypass. (Left Leg Upper) ULTRASOUND GUIDANCE FOR VASCULAR ACCESS Right Common Femoral Artery. (Right Groin)     Patient location during evaluation: PACU Anesthesia Type: General Level of consciousness: awake and alert Pain management: pain level controlled Vital Signs Assessment: post-procedure vital signs reviewed and stable Respiratory status: spontaneous breathing, nonlabored ventilation and respiratory function stable Cardiovascular status: blood pressure returned to baseline and stable Postop Assessment: no apparent nausea or vomiting Anesthetic complications: no   No complications documented.  Last Vitals:  Vitals:   01/23/20 0500 01/23/20 0515  BP: (!) 138/93 (!) 135/90  Pulse: 80 74  Resp: 16 14  Temp:    SpO2: 94% 93%    Last Pain:  Vitals:   01/23/20 0400  TempSrc:   PainSc: 6                  Orean Giarratano,W. EDMOND

## 2020-01-23 NOTE — Plan of Care (Signed)
  Problem: Education: Goal: Knowledge of General Education information will improve Description: Including pain rating scale, medication(s)/side effects and non-pharmacologic comfort measures Outcome: Progressing   Problem: Skin Integrity: Goal: Risk for impaired skin integrity will decrease Outcome: Progressing  Bedrest rationale explained, patient lying in bed, no hematoma noted at puncture site. Pulses palpable.

## 2020-01-24 LAB — BASIC METABOLIC PANEL
Anion gap: 9 (ref 5–15)
BUN: 17 mg/dL (ref 6–20)
CO2: 28 mmol/L (ref 22–32)
Calcium: 8.4 mg/dL — ABNORMAL LOW (ref 8.9–10.3)
Chloride: 101 mmol/L (ref 98–111)
Creatinine, Ser: 0.95 mg/dL (ref 0.61–1.24)
GFR calc Af Amer: >60 mL/min (ref >60)
GFR calc non Af Amer: >60 mL/min (ref >60)
Glucose, Bld: 134 mg/dL — ABNORMAL HIGH (ref 70–99)
Potassium: 3.5 mmol/L (ref 3.5–5.1)
Sodium: 138 mmol/L (ref 135–145)

## 2020-01-24 LAB — CBC
HCT: 29.7 % — ABNORMAL LOW (ref 39.0–52.0)
Hemoglobin: 9.4 g/dL — ABNORMAL LOW (ref 13.0–17.0)
MCH: 25.5 pg — ABNORMAL LOW (ref 26.0–34.0)
MCHC: 31.6 g/dL (ref 30.0–36.0)
MCV: 80.5 fL (ref 80.0–100.0)
Platelets: 180 10*3/uL (ref 150–400)
RBC: 3.69 MIL/uL — ABNORMAL LOW (ref 4.22–5.81)
RDW: 15.8 % — ABNORMAL HIGH (ref 11.5–15.5)
WBC: 10 10*3/uL (ref 4.0–10.5)
nRBC: 0 % (ref 0.0–0.2)

## 2020-01-24 LAB — HEPARIN LEVEL (UNFRACTIONATED): Heparin Unfractionated: <0.1 IU/mL — ABNORMAL LOW (ref 0.30–0.70)

## 2020-01-24 MED ORDER — APIXABAN 5 MG PO TABS
5.0000 mg | ORAL_TABLET | Freq: Two times a day (BID) | ORAL | Status: DC
  Administered 2020-01-24: 5 mg via ORAL
  Filled 2020-01-24: qty 1

## 2020-01-24 MED ORDER — APIXABAN 5 MG PO TABS: 5.0000 mg | ORAL_TABLET | Freq: Two times a day (BID) | ORAL | 3 refills | Status: DC

## 2020-01-24 NOTE — Progress Notes (Signed)
Pt urinated after foley was d/c. Walked about 250 ft in the hall. Pt tolerated well.   Lavenia Atlas, RN

## 2020-01-24 NOTE — Discharge Instructions (Signed)
[facility]  Discharge Instructions  Lower Extremity Angiogram; Angioplasty/Stenting  Please refer to the following instructions for your post-procedure care. Your surgeon or physician assistant will discuss any changes with you.  Activity  Avoid lifting more than 8 pounds (1 gallons of milk) for 5 days after your procedure. You may walk as much as you can tolerate. It's OK to drive after 72 hours.  Bathing/Showering  You may shower the day after your procedure. If you have a bandage, you may remove it at 24- 48 hours. Clean your incision site with mild soap and water. Pat the area dry with a clean towel.  Diet  Resume your pre-procedure diet. There are no special food restrictions following this procedure. All patients with peripheral vascular disease should follow a low fat/low cholesterol diet. In order to heal from your surgery, it is CRITICAL to get adequate nutrition. Your body requires vitamins, minerals, and protein. Vegetables are the best source of vitamins and minerals. Vegetables also provide the perfect balance of protein. Processed food has little nutritional value, so try to avoid this.  Medications  Resume taking all of your medications unless your doctor tells you not to. If your incision is causing pain, you may take over-the-counter pain relievers such as acetaminophen (Tylenol)  Follow Up  Follow up will be arranged at the time of your procedure. You may have an office visit scheduled or may be scheduled for surgery. Ask your surgeon if you have any questions.  Please call us immediately for any of the following conditions: Severe or worsening pain your legs or feet at rest or with walking. Increased pain, redness, drainage at your groin puncture site. Fever of 101 degrees or higher. If you have any mild or slow bleeding from your puncture site: lie down, apply firm constant pressure over the area with a piece of gauze or a  clean wash cloth for 30 minutes- no peeking!, call 911 right away if you are still bleeding after 30 minutes, or if the bleeding is heavy and unmanageable.  Reduce your risk factors of vascular disease:   Stop smoking. If you would like help call [facility] at 1-800-QUIT-NOW (612)710-6140) or [place] at (519)005-7319.  Manage your cholesterol  Maintain a desired weight  Control your diabetes  Keep your blood pressure down   If you have any questions, please call the office at [phone]    Information on my medicine - ELIQUIS (apixaban)  This medication education was reviewed with me or my healthcare representative as part of my discharge preparation.  The pharmacist that spoke with me during my hospital stay was:  Llana Aliment, RPH  Why was Eliquis prescribed for you? Eliquis was prescribed for you to reduce the risk of forming blood clots.   What do You need to know about Eliquis ? Take your Eliquis TWICE DAILY - one tablet in the morning and one tablet in the evening with or without food.  It would be best to take the doses about the same time each day.  If you have difficulty swallowing the tablet whole please discuss with your pharmacist how to take the medication safely.  Take Eliquis exactly as prescribed by your doctor and DO NOT stop taking Eliquis without talking to the doctor who prescribed the medication.  Stopping may increase your risk of developing a new clot or stroke.  Refill your prescription before you run out.  After discharge, you should have regular check-up appointments with  your healthcare provider that is prescribing your Eliquis.  In the future your dose may need to be changed if your kidney function or weight changes by a significant amount or as you get older.  What do you do if you miss a dose? If you miss a dose, take it as soon as you remember on the same day and resume taking twice daily.  Do not take more than one dose of ELIQUIS  at the same time.  Important Safety Information A possible side effect of Eliquis is bleeding. You should call your healthcare provider right away if you experience any of the following: ? Bleeding from an injury or your nose that does not stop. ? Unusual colored urine (red or dark brown) or unusual colored stools (red or black). ? Unusual bruising for unknown reasons. ? A serious fall or if you hit your head (even if there is no bleeding).  Some medicines may interact with Eliquis and might increase your risk of bleeding or clotting while on Eliquis. To help avoid this, consult your healthcare provider or pharmacist prior to using any new prescription or non-prescription medications, including herbals, vitamins, non-steroidal anti-inflammatory drugs (NSAIDs) and supplements.  This website has more information on Eliquis (apixaban): [email or web address].no.

## 2020-01-24 NOTE — Progress Notes (Signed)
D/c tele and IV. Went over AVS with pt and all questions were answered.  ° °Ionna Avis S Rhapsody Wolven, RN ° °

## 2020-01-24 NOTE — Discharge Summary (Signed)
[facility] Discharge Summary   Patient ID:  [name] [name] MRN: [id number] DOB/AGE: 12-22-1960 59 y.o.  Admit date: 01/22/2020 Discharge date: 01/24/2020 Date of Surgery: 01/23/2020 Surgeon: Surgeon(s): Marty Heck, MD  Admission Diagnosis: Ischemic leg [I99.8] Limb ischemia [I99.8]  Discharge Diagnoses:  Ischemic leg [I99.8] Limb ischemia [I99.8]  Secondary Diagnoses: Past Medical History:  Diagnosis Date  . Anemia   . Anemia 08/06/2012  . 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 Community acquired pneumonia 11/07/2011  . DDD (degenerative disc disease), lumbosacral     and grade 2 slip  . GERD (gastroesophageal reflux disease)   . Heart murmur   . History of anemia    no current med.  . Hypertension    states is borderline on medication; has been on med. x 5-6 yr.  . Hypokalemia 05/30/2018  . Impingement syndrome of shoulder region 10/2014   left  . Insomnia 06/01/2015  . Ischemic ulcer of toe of left foot ([place])   . Left shoulder pain 12/16/2015  . Low back pain without sciatica 10/29/2009  . Lupus ([place])   . Peripheral vascular disease ([place])   . Pneumonia   . RA (rheumatoid arthritis) ([place])   . Rotator cuff tear 11/04/2014    Procedure(s): lysis recheck PERIPHERAL VASCULAR ATHERECTOMY PERIPHERAL VASCULAR BALLOON ANGIOPLASTY  Discharged Condition: good  HPI: This is a 59 y.o. male with history of peripheral vascular disease status post left common femoral to below-knee popliteal bypass with PTFE last year with Dr. Trula Slade that presents with occluded bypass and acute on chronic limb ischemia of the left leg.  Currently [place] IIa and is motor intact.  I have recommended left lower extremity arteriogram and possible thrombolysis.  Discussed if I cannot get down the bypass potentially would need a thrombectomy tonight.  If lysis discussed bleeding risk, going to ICU, return tomorrow for lytics catheter check.   Hospital Course:  [name] [name] is a 59  y.o. male is S/P Left Procedure(s): 1.  Ultrasound-guided access of right common femoral artery 2.  Catheter selection of aorta 3.  Left lower extremity arteriogram with selection of third order branches 4.  Placement of thrombolytic's catheter in the left common femoral to below-knee popliteal artery prosthetic bypass with initiation of catheter directed thrombolytics   1.  Thrombolytics catheter check of the left lower extremity with thrombolysis catheter in the left common femoral to below-knee popliteal prosthetic bypass 2.  Laser atherectomy of the left common femoral to below-knee popliteal prosthetic bypass (2.0 mm Auryon laser) 3.  Laser atherectomy of the native below-knee popliteal artery and anterior tibial artery (1.5 mm Auryon laser) 4.  Balloon angioplasty of left common femoral to below-knee popliteal bypass with 6 mm angioplasty balloon 5.  Balloon angioplasty of the left native below-knee popliteal artery and anterior tibial artery with a 3 mm angioplasty balloon 6.  Penumbra percutaneous mechanical thrombectomy of left common femoral to below-knee popliteal prosthetic bypass 7.  Penumbra percutaneous mechanical thrombectomy of the left profunda femoris artery  8.  Stent of the distal popliteal prosthetic bypass into the below-knee native popliteal artery (5 mm x 60 mm Tigris)  9.  144 minutes of monitored moderate conscious sedation time 10.  Mynx closure of the right common femoral artery   Tolerated procedures well and was transferred to the recovery room in stable condition  Extubated: POD # 0 Post-op wounds clean, dry, intact or healing well Pt. Ambulating, voiding and taking PO diet without difficulty. Pt  pain controlled with PO pain meds. Labs as below Complications:none  POD #1 he remained stable s/p successful thrombolysis of his left lower extremity femoropopliteal bypass graft. Left lower extremity remained well perfused with brisk AT/ PT signals by doppler.  Right femoral access site remained clean, dry and intact without swelling or hematoma. He was started on Eliquis and IV heparin was discontinued. He was tolerating ambulation and diet. Voiding without difficulty. He will discharge home on Eliquis and Plavix. He will follow up with Dr. Trula Slade in 4 weeks  Consults:  Treatment Team:  Marty Heck, MD  Significant Diagnostic Studies: CBC Lab Results  Component Value Date   WBC 10.0 01/24/2020   HGB 9.4 (L) 01/24/2020   HCT 29.7 (L) 01/24/2020   MCV 80.5 01/24/2020   PLT 180 01/24/2020    BMET    Component Value Date/Time   NA 138 01/24/2020 0202   NA 140 05/30/2018 1413   K 3.5 01/24/2020 0202   CL 101 01/24/2020 0202   CO2 28 01/24/2020 0202   GLUCOSE 134 (H) 01/24/2020 0202   BUN 17 01/24/2020 0202   BUN 9 05/30/2018 1413   CREATININE 0.95 01/24/2020 0202   CREATININE 0.80 11/27/2012 0956   CALCIUM 8.4 (L) 01/24/2020 0202   GFRNONAA >60 01/24/2020 0202   GFRNONAA >89 11/27/2012 0956   GFRAA >60 01/24/2020 0202   GFRAA >89 11/27/2012 0956   COAG Lab Results  Component Value Date   INR 1.0 02/26/2019   INR 1.0 02/19/2019   INR 1.0 03/13/2017     Disposition:  Discharge to :Home Discharge Instructions    Discharge patient   Complete by: As directed    Once he has been transitioned to Eliquis and also is ambulating without difficulty   Discharge disposition: 01-Home or Self Care   Discharge patient date: 01/24/2020     Allergies as of 01/24/2020      Reactions   Morphine And Related Rash   Ramipril Rash   Per patient on R arm and leg only; no angioedema   Tramadol Itching, Rash      Medication List    STOP taking these medications   Bayer Aspirin 325 MG tablet Generic drug: aspirin   polyethylene glycol 17 g packet Commonly known as: MIRALAX / GLYCOLAX   valsartan 40 MG tablet Commonly known as: Diovan     TAKE these medications   amLODipine 10 MG tablet Commonly known as: NORVASC TAKE 1  TABLET BY MOUTH EVERY DAY   apixaban 5 MG Tabs tablet Commonly known as: ELIQUIS Take 1 tablet (5 mg total) by mouth 2 (two) times daily.   ascorbic acid 500 MG tablet Commonly known as: VITAMIN C Take 500 mg by mouth daily.   atorvastatin 80 MG tablet Commonly known as: LIPITOR Take 1 tablet (80 mg total) by mouth daily at 6 PM.   chlorthalidone 25 MG tablet Commonly known as: HYGROTON Take 1 tablet (25 mg total) by mouth daily.   clopidogrel 75 MG tablet Commonly known as: Plavix Take 1 tablet (75 mg total) by mouth daily.   docusate sodium 100 MG capsule Commonly known as: COLACE Take 1 capsule (100 mg total) by mouth daily.   gabapentin 100 MG capsule Commonly known as: NEURONTIN Take 100 mg by mouth daily.   Multi-Vitamins Tabs Take 1 tablet by mouth daily.   naloxone 2 MG/2ML injection Commonly known as: NARCAN Inject 2 mg into the muscle once as needed (opiod overdose (call 911,  inject intramuscularly in shoulder or thigh. Repeat every 3 minutes)).   nicotine 21 mg/24hr patch Commonly known as: NICODERM CQ - dosed in mg/24 hours Place 1 patch (21 mg total) onto the skin daily.   oxyCODONE-acetaminophen 10-325 MG tablet Commonly known as: PERCOCET Take 1 tablet by mouth every 8 (eight) hours as needed for pain.   pantoprazole 40 MG tablet Commonly known as: PROTONIX TAKE 1 TABLET EVERY DAY   predniSONE 5 MG tablet Commonly known as: DELTASONE TAKE 1 TABLET BY MOUTH EVERY DAY WITH BREAKFAST What changed: See the new instructions.   REFRESH DRY EYE THERAPY OP Place 1 drop into both eyes 3 (three) times daily as needed (dry/irritated eyes.).      Verbal and written Discharge instructions given to the patient. Wound care per Discharge AVS  Follow-up Information    Serafina Mitchell, MD Follow up in 1 month(s).   Specialties: Vascular Surgery, Cardiology Why: the office will contact the patient with their follow up appointment Contact information: [phone] [place] [address] [phone]               Prescriptions given: Eliquis 5 mg Take one pill twice daily by mouth #60 3 refills  Disposition: Home  Patient's condition: is Excellent  Follow up: 1. Dr. Trula Slade in 4 weeks with ABI's and Left lower extremity bypass duplex   Paulo Fruit, PA-C [facility] [phone] 01/25/2020  8:19 AM  - For VQI Registry use ---   Post-op:  Wound infection: No  Graft infection: No  Transfusion: No    If yes, 0 units given New Arrhythmia: No Ipsilateral amputation: No, [ ]  Minor, [ ]  BKA, [ ]  AKA Discharge patency: [ ]  Primary, [X]  Primary assisted, [ ]  Secondary, [ ]  Occluded Patency judged by: [X]  Dopper only, [ ]  Palpable graft pulse, []  Palpable distal pulse D/C Ambulatory Status: Ambulatory  Complications: MI: No, [ ]  Troponin only, [ ]  EKG or Clinical CHF: No Resp failure:No, [ ]  Pneumonia, [ ]  Ventilator Chg in renal function: No, [ ]  Inc. Cr > 0.5, [ ]  Temp. Dialysis,  [ ]  Permanent dialysis Stroke: No, [ ]  Minor, [ ]  Major Return to OR: No  Reason for return to OR: [ ]  Bleeding, [ ]  Infection, [ ]  Thrombosis, [ ]  Revision  Discharge medications: Statin use:  yes ASA use:  no Plavix use:  yes Beta blocker use: no CCB use:  Yes ACEI use:   no ARB use:  no Coumadin use: no On Eliquis

## 2020-01-24 NOTE — Progress Notes (Addendum)
   VASCULAR SURGERY ASSESSMENT & PLAN:   POD 1 S/P THROMBOLYSIS OF OCCLUDED LEFT FEMOROPOPLITEAL BYPASS: This patient had an occluded left femoral to below-knee popliteal artery bypass which was done with PTFE.  He has undergone successful thrombolysis of his graft.  I have reviewed his films.  There was some slight irregularity at the distal anastomosis and compromised runoff.  The dominant runoff is the anterior tibial artery.  For this reason I think he should be on a DOAC.  I have ordered Eliquis and we can then stop his heparin.  We will leave him on Plavix but stop his aspirin.  Ambulate this morning.  If he is able to ambulate he can be discharged on Plavix and Eliquis.  He is also on a statin.  He will need close follow-up with Dr. Trula Slade.  SUBJECTIVE:   No specific complaints.  PHYSICAL EXAM:   Vitals:   01/23/20 1710 01/23/20 1743 01/23/20 2026 01/23/20 2325  BP:  98/75 100/67 104/66  Pulse:   85 80  Resp:   18 19  Temp: 98.4 F (36.9 C)  98.2 F (36.8 C) 97.9 F (36.6 C)  TempSrc: Oral  Oral Oral  SpO2:   96% 93%  Weight:      Height:       Brisk anterior tibial and posterior tibial signal with the Doppler. Right groin site looks fine.  LABS:   Lab Results  Component Value Date   WBC 10.0 01/24/2020   HGB 9.4 (L) 01/24/2020   HCT 29.7 (L) 01/24/2020   MCV 80.5 01/24/2020   PLT 180 01/24/2020    PROBLEM LIST:    Active Problems:   Ischemic leg   Limb ischemia   CURRENT MEDS:   . amLODipine  10 mg Oral Daily  . atorvastatin  80 mg Oral q1800  . Chlorhexidine Gluconate Cloth  6 each Topical Daily  . clopidogrel  75 mg Oral Daily  . sodium chloride flush  3 mL Intravenous Q12H  . sodium chloride flush  3 mL Intravenous Q12H    Deitra Mayo Office: [phone] 01/24/2020

## 2020-01-24 NOTE — Progress Notes (Addendum)
[facility] for Heparin>>>>>Apixaban Indication: Ischemic Leg   Allergies  Allergen Reactions  . Morphine And Related Rash  . Ramipril Rash    Per patient on R arm and leg only; no angioedema  . Tramadol Itching and Rash    Patient Measurements: Height: 5' 7.5" (171.5 cm) Weight: 78.7 kg (173 lb 8 oz) IBW/kg (Calculated) : 67.25 Heparin Dosing Weight: 79.6 kg  Vital Signs: Temp: 97.9 F (36.6 C) (07/30 2325) Temp Source: Oral (07/30 2325) BP: 104/66 (07/30 2325) Pulse Rate: 80 (07/30 2325)  Labs: Recent Labs    01/22/20 1937 01/22/20 1959 01/23/20 0107 01/23/20 0107 01/23/20 0735 01/24/20 0202  HGB   < > 11.9* 10.7*   < > 11.7* 9.4*  HCT   < > 35.0* 33.6*  --  38.1* 29.7*  PLT   < >  --  220  --  191 180  HEPARINUNFRC  --   --  0.22*  --  <0.10* <0.10*  CREATININE  --  1.10  --   --  0.85 0.95   < > = values in this interval not displayed.    Estimated Creatinine Clearance: 79.7 mL/min (by C-G formula based on SCr of 0.95 mg/dL).   Medical History: Past Medical History:  Diagnosis Date  . Anemia   . Anemia 08/06/2012  . 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 Community acquired pneumonia 11/07/2011  . DDD (degenerative disc disease), lumbosacral     and grade 2 slip  . GERD (gastroesophageal reflux disease)   . Heart murmur   . History of anemia    no current med.  . Hypertension    states is borderline on medication; has been on med. x 5-6 yr.  . Hypokalemia 05/30/2018  . Impingement syndrome of shoulder region 10/2014   left  . Insomnia 06/01/2015  . Ischemic ulcer of toe of left foot ([place])   . Left shoulder pain 12/16/2015  . Low back pain without sciatica 10/29/2009  . Lupus ([place])   . Peripheral vascular disease ([place])   . Pneumonia   . RA (rheumatoid arthritis) ([place])   . Rotator cuff tear 11/04/2014    Assessment: Patient is a 59 yr old male that presented to the ED with an ischemic left leg. The patient was on aspirin  and Plavix PTA. Pharmacy has been asked to dose heparin at this time.   He was on catheter directed lysis earlier today prior to vascular procedure, with heparin infusing at 1000 units/hr prior to vascular procedure today. Heparin was interrupted for procedure and alteplase has been discontinued; pharmacy is consulted to resume heparin 6 hrs after sheath pulled (per procedure log, sheath was pulled ~1500 PM this afternoon).   H/H 11.7/38.1, platelets 191; fibrinogen 576 earlier today, 581 this afternoon after procedure  7/31 AM update:  Heparin level undetectable  No issues per RN  Goal of Therapy:  Heparin level 0.3-0.7 units/ml (goal is 0.3-0.5 units/ml for next 24 hrs) Monitor platelets by anticoagulation protocol: Yes   Plan:   Inc heparin to 1200 units/hr 1200 heparin level  Narda Bonds, PharmD, BCPS Clinical Pharmacist Phone: [phone] ===========================================  01/24/2020 7:02 AM Addendum -Stop heparin -Switching to Apixaban 5mg  BID -Daily CBC -Monitor for bleeding  Narda Bonds, PharmD, BCPS Clinical Pharmacist Phone: [phone]

## 2020-01-26 ENCOUNTER — Encounter (HOSPITAL_COMMUNITY): Payer: Self-pay | Admitting: Vascular Surgery

## 2020-01-26 MED FILL — Heparin Sod (Porcine)-NaCl IV Soln 1000 Unit/500ML-0.9%: INTRAVENOUS | Qty: 500 | Status: AC

## 2020-01-27 ENCOUNTER — Other Ambulatory Visit: Payer: Self-pay | Admitting: *Deleted

## 2020-01-27 DIAGNOSIS — I10 Essential (primary) hypertension: Secondary | ICD-10-CM

## 2020-01-27 MED ORDER — AMLODIPINE BESYLATE 10 MG PO TABS: 10.0000 mg | ORAL_TABLET | Freq: Every day | ORAL | 1 refills | Status: DC

## 2020-01-28 DIAGNOSIS — M5136 Other intervertebral disc degeneration, lumbar region: Secondary | ICD-10-CM | POA: Diagnosis not present

## 2020-01-28 DIAGNOSIS — G894 Chronic pain syndrome: Secondary | ICD-10-CM | POA: Diagnosis not present

## 2020-02-10 ENCOUNTER — Other Ambulatory Visit: Payer: Self-pay

## 2020-02-10 DIAGNOSIS — I739 Peripheral vascular disease, unspecified: Secondary | ICD-10-CM

## 2020-02-23 ENCOUNTER — Ambulatory Visit (INDEPENDENT_AMBULATORY_CARE_PROVIDER_SITE_OTHER): Payer: Medicare Other | Admitting: Surgery

## 2020-02-23 ENCOUNTER — Encounter: Payer: Self-pay | Admitting: Surgery

## 2020-02-23 ENCOUNTER — Ambulatory Visit (HOSPITAL_COMMUNITY)
Admission: RE | Admit: 2020-02-23 | Discharge: 2020-02-23 | Disposition: A | Discharge status: Home / Self Care | Payer: Medicare Other | Source: Ambulatory Visit | Attending: Surgery | Admitting: Surgery

## 2020-02-23 ENCOUNTER — Ambulatory Visit (INDEPENDENT_AMBULATORY_CARE_PROVIDER_SITE_OTHER)
Admission: RE | Admit: 2020-02-23 | Discharge: 2020-02-23 | Disposition: A | Discharge status: Home / Self Care | Payer: Medicare Other | Source: Ambulatory Visit | Attending: Surgery | Admitting: Surgery

## 2020-02-23 ENCOUNTER — Other Ambulatory Visit: Payer: Self-pay

## 2020-02-23 VITALS — BP 105/78 | HR 90 | Temp 98.1°F | Resp 20 | Ht 67.5 in | Wt 168.0 lb

## 2020-02-23 DIAGNOSIS — I7025 Atherosclerosis of native arteries of other extremities with ulceration: Secondary | ICD-10-CM

## 2020-02-23 DIAGNOSIS — I739 Peripheral vascular disease, unspecified: Secondary | ICD-10-CM

## 2020-02-23 NOTE — Progress Notes (Signed)
[facility]  Patient name: [name] [name] MRN: [id number] DOB: 12/02/1960 Sex: male   REASON FOR VISIT:    Follow up  HISOTRY OF PRESENT ILLNESS:    [name] [name] is a 59 y.o. male who I saw on 02/19/2019 for left toe pain with skin breakdown which has been present for several weeks.  He went for arteriogram and was found to have a flush occlusion of his left superficial femoral artery with single-vessel runoff via the anterior tibial artery.  On 02/26/2019 he underwent left common femoral to below-knee popliteal artery bypass graft with PTFE.  I harvested his saphenous vein but it was ultimately not adequate.  He presented in late July 2021 with bypass graft occlusion.  He underwent lytic intervention including laser atherectomy and mechanical thrombectomy.  A Tigris stent was placed across the distal anastomosis.  He was discharged home on Eliquis and Plavix.  His aspirin was discontinued.  He does not endorse any new complaints today.  He continues to smoke.  He takes a statin for hypercholesterolemia.  He is medically managed for hypertension.  PAST MEDICAL HISTORY:   Past Medical History:  Diagnosis Date  . Anemia   . Anemia 08/06/2012  . 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 Community acquired pneumonia 11/07/2011  . DDD (degenerative disc disease), lumbosacral     and grade 2 slip  . GERD (gastroesophageal reflux disease)   . Heart murmur   . History of anemia    no current med.  . Hypertension    states is borderline on medication; has been on med. x 5-6 yr.  . Hypokalemia 05/30/2018  . Impingement syndrome of shoulder region 10/2014   left  . Insomnia 06/01/2015  . Ischemic ulcer of toe of left foot ([place])   . Left shoulder pain 12/16/2015  . Low back pain without sciatica 10/29/2009  . Lupus ([place])   . Peripheral vascular disease ([place])   . Pneumonia   . RA (rheumatoid arthritis) ([place])   . Rotator cuff tear 11/04/2014       FAMILY HISTORY:   Family History  Problem Relation Age of Onset  . Heart attack Father 69  . Alcohol abuse Father   . Aneurysm Mother   . Stroke Neg Hx   . Cancer Neg Hx     SOCIAL HISTORY:   Social History   Tobacco Use  . Smoking status: Current Every Day Smoker    Packs/day: 0.25    Years: 20.00    Pack years: 5.00    Types: Cigarettes    Last attempt to quit: 02/19/2019    Years since quitting: 1.0  . Smokeless tobacco: Never Used  . Tobacco comment: 1-2 cigs per day; trying to quit  Substance Use Topics  . Alcohol use: No    Alcohol/week: 0.0 standard drinks    Comment: sober 1998     ALLERGIES:   Allergies  Allergen Reactions  . Morphine And Related Rash  . Ramipril Rash    Per patient on R arm and leg only; no angioedema  . Tramadol Itching and Rash     CURRENT MEDICATIONS:   Current Outpatient Medications  Medication Sig Dispense Refill  . amLODipine (NORVASC) 10 MG tablet Take 1 tablet (10 mg total) by mouth daily. 90 tablet 1  . apixaban (ELIQUIS) 5 MG TABS tablet Take 1 tablet (5 mg total) by mouth 2 (two) times daily. 60 tablet 3  . ascorbic acid (VITAMIN  C) 500 MG tablet Take 500 mg by mouth daily.     [name] atorvastatin (LIPITOR) 80 MG tablet Take 1 tablet (80 mg total) by mouth daily at 6 PM. 30 tablet 11  . chlorthalidone (HYGROTON) 25 MG tablet Take 1 tablet (25 mg total) by mouth daily. 30 tablet 5  . clopidogrel (PLAVIX) 75 MG tablet Take 1 tablet (75 mg total) by mouth daily. 30 tablet 11  . docusate sodium (COLACE) 100 MG capsule Take 1 capsule (100 mg total) by mouth daily. 10 capsule 0  . gabapentin (NEURONTIN) 100 MG capsule Take 100 mg by mouth daily.     . Glycerin-Polysorbate 80 (REFRESH DRY EYE THERAPY OP) Place 1 drop into both eyes 3 (three) times daily as needed (dry/irritated eyes.).    [name] Multiple Vitamin (MULTI-VITAMINS) TABS Take 1 tablet by mouth daily.     . naloxone (NARCAN) 2 MG/2ML injection Inject 2 mg into the muscle  once as needed (opiod overdose (call 911, inject intramuscularly in shoulder or thigh. Repeat every 3 minutes)).     . nicotine (NICODERM CQ - DOSED IN MG/24 HOURS) 21 mg/24hr patch Place 1 patch (21 mg total) onto the skin daily. 28 patch 0  . oxyCODONE-acetaminophen (PERCOCET) 10-325 MG tablet Take 1 tablet by mouth every 8 (eight) hours as needed for pain.     . pantoprazole (PROTONIX) 40 MG tablet TAKE 1 TABLET EVERY DAY (Patient taking differently: Take 40 mg by mouth daily. ) 30 tablet 1  . predniSONE (DELTASONE) 5 MG tablet TAKE 1 TABLET BY MOUTH EVERY DAY WITH BREAKFAST (Patient taking differently: Take 5 mg by mouth daily. ) 30 tablet 5  . valsartan (DIOVAN) 40 MG tablet Take 40 mg by mouth daily.     No current facility-administered medications for this visit.    REVIEW OF SYSTEMS:   [X]  denotes positive finding, [ ]  denotes negative finding Cardiac  Comments:  Chest pain or chest pressure:    Shortness of breath upon exertion:    Short of breath when lying flat:    Irregular heart rhythm:        Vascular    Pain in calf, thigh, or hip brought on by ambulation:    Pain in feet at night that wakes you up from your sleep:     Blood clot in your veins:    Leg swelling:         Pulmonary    Oxygen at home:    Productive cough:     Wheezing:         Neurologic    Sudden weakness in arms or legs:     Sudden numbness in arms or legs:     Sudden onset of difficulty speaking or slurred speech:    Temporary loss of vision in one eye:     Problems with dizziness:         Gastrointestinal    Blood in stool:     Vomited blood:         Genitourinary    Burning when urinating:     Blood in urine:        Psychiatric    Major depression:         Hematologic    Bleeding problems:    Problems with blood clotting too easily:        Skin    Rashes or ulcers:        Constitutional    Fever or chills:  PHYSICAL EXAM:   Vitals:   02/23/20 1412  BP: 105/78  Pulse:  90  Resp: 20  Temp: 98.1 F (36.7 C)  SpO2: 97%  Weight: 168 lb (76.2 kg)  Height: 5' 7.5" (1.715 m)    GENERAL: The patient is a well-nourished male, in no acute distress. The vital signs are documented above. CARDIAC: There is a regular rate and rhythm.  VASCULAR: I had trouble palpating pedal pulses on the left secondary to mild amount of edema. PULMONARY: Non-labored respirations ABDOMEN: Soft and non-tender with normal pitched bowel sounds.  MUSCULOSKELETAL: There are no major deformities or cyanosis. NEUROLOGIC: No focal weakness or paresthesias are detected. SKIN: There are no ulcers or rashes noted. PSYCHIATRIC: The patient has a normal affect.  STUDIES:   I have reviewed his duplex with the following findings: +-------+-----------+-----------+------------+------------+  ABI/TBIToday's ABIToday's TBIPrevious ABIPrevious TBI  +-------+-----------+-----------+------------+------------+  Right 1.22    0.48                  +-------+-----------+-----------+------------+------------+  Left  1.29    0.65                  +-------+-----------+-----------+------------+------------+   Left: Patent left femoral to below knee politeal bypass graft. Patent  stent.    MEDICAL ISSUES:   Status post thrombolysis of an occluded left femoral-popliteal bypass graft: Unfortunately the patient had a early occlusion of his bypass graft.  He was able to have this opened back up with lytic therapy and a stent was placed across the distal anastomosis.  His medication regimen was changed to Eliquis and Plavix.  He has no complaints today.  His bypass graft appears to be widely patent.  His ABIs are normal.  I have him scheduled for every 3 month follow-up for the first year and then he can go to every 6 month follow-up and then annually.  He will remain on Eliquis and Plavix as long as he can tolerate them together.  I did discuss that he is  high risk for reocclusion.    Leia Alf, MD, FACS [facility] [phone] Pager [phone]

## 2020-02-24 ENCOUNTER — Other Ambulatory Visit: Payer: Self-pay | Admitting: *Deleted

## 2020-02-24 DIAGNOSIS — I7025 Atherosclerosis of native arteries of other extremities with ulceration: Secondary | ICD-10-CM

## 2020-02-24 DIAGNOSIS — I739 Peripheral vascular disease, unspecified: Secondary | ICD-10-CM

## 2020-02-25 DIAGNOSIS — G894 Chronic pain syndrome: Secondary | ICD-10-CM | POA: Diagnosis not present

## 2020-02-25 DIAGNOSIS — M5136 Other intervertebral disc degeneration, lumbar region: Secondary | ICD-10-CM | POA: Diagnosis not present

## 2020-04-13 DIAGNOSIS — M48062 Spinal stenosis, lumbar region with neurogenic claudication: Secondary | ICD-10-CM | POA: Diagnosis not present

## 2020-04-13 DIAGNOSIS — M79669 Pain in unspecified lower leg: Secondary | ICD-10-CM | POA: Diagnosis not present

## 2020-04-13 DIAGNOSIS — I70213 Atherosclerosis of native arteries of extremities with intermittent claudication, bilateral legs: Secondary | ICD-10-CM | POA: Diagnosis not present

## 2020-04-13 DIAGNOSIS — G894 Chronic pain syndrome: Secondary | ICD-10-CM | POA: Diagnosis not present

## 2020-04-13 DIAGNOSIS — G8929 Other chronic pain: Secondary | ICD-10-CM | POA: Diagnosis not present

## 2020-04-13 DIAGNOSIS — R5382 Chronic fatigue, unspecified: Secondary | ICD-10-CM | POA: Diagnosis not present

## 2020-04-13 DIAGNOSIS — M792 Neuralgia and neuritis, unspecified: Secondary | ICD-10-CM | POA: Diagnosis not present

## 2020-04-13 DIAGNOSIS — M5136 Other intervertebral disc degeneration, lumbar region: Secondary | ICD-10-CM | POA: Diagnosis not present

## 2020-04-13 DIAGNOSIS — Z79891 Long term (current) use of opiate analgesic: Secondary | ICD-10-CM | POA: Diagnosis not present

## 2020-04-28 ENCOUNTER — Other Ambulatory Visit: Payer: Self-pay

## 2020-04-28 ENCOUNTER — Inpatient Hospital Stay (HOSPITAL_COMMUNITY)
Admission: EM | Admit: 2020-04-28 | Discharge: 2020-05-04 | DRG: 271 | Disposition: A | Discharge status: Home Health | Payer: Medicare Other | Attending: Vascular Surgery | Admitting: Vascular Surgery

## 2020-04-28 ENCOUNTER — Encounter (HOSPITAL_COMMUNITY): Payer: Self-pay | Admitting: Emergency Medicine

## 2020-04-28 DIAGNOSIS — I70248 Atherosclerosis of native arteries of left leg with ulceration of other part of lower left leg: Secondary | ICD-10-CM | POA: Diagnosis not present

## 2020-04-28 DIAGNOSIS — K08109 Complete loss of teeth, unspecified cause, unspecified class: Secondary | ICD-10-CM | POA: Diagnosis not present

## 2020-04-28 DIAGNOSIS — Z79899 Other long term (current) drug therapy: Secondary | ICD-10-CM | POA: Diagnosis not present

## 2020-04-28 DIAGNOSIS — K219 Gastro-esophageal reflux disease without esophagitis: Secondary | ICD-10-CM | POA: Diagnosis present

## 2020-04-28 DIAGNOSIS — I2510 Atherosclerotic heart disease of native coronary artery without angina pectoris: Secondary | ICD-10-CM | POA: Diagnosis not present

## 2020-04-28 DIAGNOSIS — Z7901 Long term (current) use of anticoagulants: Secondary | ICD-10-CM

## 2020-04-28 DIAGNOSIS — E785 Hyperlipidemia, unspecified: Secondary | ICD-10-CM | POA: Diagnosis present

## 2020-04-28 DIAGNOSIS — I25110 Atherosclerotic heart disease of native coronary artery with unstable angina pectoris: Secondary | ICD-10-CM | POA: Diagnosis not present

## 2020-04-28 DIAGNOSIS — Z8701 Personal history of pneumonia (recurrent): Secondary | ICD-10-CM

## 2020-04-28 DIAGNOSIS — M069 Rheumatoid arthritis, unspecified: Secondary | ICD-10-CM | POA: Diagnosis not present

## 2020-04-28 DIAGNOSIS — Z981 Arthrodesis status: Secondary | ICD-10-CM | POA: Diagnosis not present

## 2020-04-28 DIAGNOSIS — Z885 Allergy status to narcotic agent status: Secondary | ICD-10-CM

## 2020-04-28 DIAGNOSIS — I739 Peripheral vascular disease, unspecified: Secondary | ICD-10-CM

## 2020-04-28 DIAGNOSIS — F17210 Nicotine dependence, cigarettes, uncomplicated: Secondary | ICD-10-CM | POA: Diagnosis not present

## 2020-04-28 DIAGNOSIS — Z20822 Contact with and (suspected) exposure to covid-19: Secondary | ICD-10-CM | POA: Diagnosis present

## 2020-04-28 DIAGNOSIS — R609 Edema, unspecified: Secondary | ICD-10-CM | POA: Diagnosis not present

## 2020-04-28 DIAGNOSIS — Z7902 Long term (current) use of antithrombotics/antiplatelets: Secondary | ICD-10-CM

## 2020-04-28 DIAGNOSIS — Z8249 Family history of ischemic heart disease and other diseases of the circulatory system: Secondary | ICD-10-CM | POA: Diagnosis not present

## 2020-04-28 DIAGNOSIS — Z8616 Personal history of COVID-19: Secondary | ICD-10-CM

## 2020-04-28 DIAGNOSIS — Z7952 Long term (current) use of systemic steroids: Secondary | ICD-10-CM

## 2020-04-28 DIAGNOSIS — R52 Pain, unspecified: Secondary | ICD-10-CM | POA: Diagnosis not present

## 2020-04-28 DIAGNOSIS — Z888 Allergy status to other drugs, medicaments and biological substances status: Secondary | ICD-10-CM | POA: Diagnosis not present

## 2020-04-28 DIAGNOSIS — Z743 Need for continuous supervision: Secondary | ICD-10-CM | POA: Diagnosis not present

## 2020-04-28 DIAGNOSIS — I70445 Atherosclerosis of autologous vein bypass graft(s) of the left leg with ulceration of other part of foot: Secondary | ICD-10-CM | POA: Diagnosis not present

## 2020-04-28 DIAGNOSIS — Z9889 Other specified postprocedural states: Secondary | ICD-10-CM | POA: Diagnosis present

## 2020-04-28 DIAGNOSIS — M62262 Nontraumatic ischemic infarction of muscle, left lower leg: Secondary | ICD-10-CM | POA: Diagnosis not present

## 2020-04-28 DIAGNOSIS — R531 Weakness: Secondary | ICD-10-CM | POA: Diagnosis not present

## 2020-04-28 DIAGNOSIS — I10 Essential (primary) hypertension: Secondary | ICD-10-CM | POA: Diagnosis not present

## 2020-04-28 DIAGNOSIS — I70229 Atherosclerosis of native arteries of extremities with rest pain, unspecified extremity: Secondary | ICD-10-CM | POA: Diagnosis present

## 2020-04-28 DIAGNOSIS — L97529 Non-pressure chronic ulcer of other part of left foot with unspecified severity: Secondary | ICD-10-CM | POA: Diagnosis not present

## 2020-04-28 DIAGNOSIS — M79672 Pain in left foot: Secondary | ICD-10-CM | POA: Diagnosis not present

## 2020-04-28 DIAGNOSIS — I998 Other disorder of circulatory system: Principal | ICD-10-CM

## 2020-04-28 DIAGNOSIS — T82868A Thrombosis of vascular prosthetic devices, implants and grafts, initial encounter: Principal | ICD-10-CM | POA: Diagnosis present

## 2020-04-28 DIAGNOSIS — D62 Acute posthemorrhagic anemia: Secondary | ICD-10-CM | POA: Diagnosis not present

## 2020-04-28 DIAGNOSIS — T82898A Other specified complication of vascular prosthetic devices, implants and grafts, initial encounter: Secondary | ICD-10-CM

## 2020-04-28 DIAGNOSIS — T82858A Stenosis of vascular prosthetic devices, implants and grafts, initial encounter: Secondary | ICD-10-CM | POA: Diagnosis not present

## 2020-04-28 HISTORY — DX: Other specified postprocedural states: Z98.890

## 2020-04-28 LAB — RESPIRATORY PANEL BY RT PCR (FLU A&B, COVID)
Influenza A by PCR: NEGATIVE
Influenza B by PCR: NEGATIVE
SARS Coronavirus 2 by RT PCR: NEGATIVE

## 2020-04-28 LAB — CBC
HCT: 38.3 % — ABNORMAL LOW (ref 39.0–52.0)
Hemoglobin: 11.5 g/dL — ABNORMAL LOW (ref 13.0–17.0)
MCH: 23.6 pg — ABNORMAL LOW (ref 26.0–34.0)
MCHC: 30 g/dL (ref 30.0–36.0)
MCV: 78.5 fL — ABNORMAL LOW (ref 80.0–100.0)
Platelets: 254 10*3/uL (ref 150–400)
RBC: 4.88 MIL/uL (ref 4.22–5.81)
RDW: 16 % — ABNORMAL HIGH (ref 11.5–15.5)
WBC: 9.5 10*3/uL (ref 4.0–10.5)
nRBC: 0 % (ref 0.0–0.2)

## 2020-04-28 LAB — PROTIME-INR
INR: 1.1 (ref 0.8–1.2)
Prothrombin Time: 13.5 seconds (ref 11.4–15.2)

## 2020-04-28 LAB — BASIC METABOLIC PANEL
Anion gap: 13 (ref 5–15)
BUN: 26 mg/dL — ABNORMAL HIGH (ref 6–20)
CO2: 27 mmol/L (ref 22–32)
Calcium: 9.2 mg/dL (ref 8.9–10.3)
Chloride: 96 mmol/L — ABNORMAL LOW (ref 98–111)
Creatinine, Ser: 1.51 mg/dL — ABNORMAL HIGH (ref 0.61–1.24)
GFR, Estimated: 53 mL/min — ABNORMAL LOW (ref >60)
Glucose, Bld: 110 mg/dL — ABNORMAL HIGH (ref 70–99)
Potassium: 3.4 mmol/L — ABNORMAL LOW (ref 3.5–5.1)
Sodium: 136 mmol/L (ref 135–145)

## 2020-04-28 LAB — APTT: aPTT: 33 seconds (ref 24–36)

## 2020-04-28 LAB — HIV ANTIBODY (ROUTINE TESTING W REFLEX): HIV Screen 4th Generation wRfx: NONREACTIVE

## 2020-04-28 MED ORDER — SODIUM CHLORIDE 0.9 % IV SOLN
INTRAVENOUS | Status: DC
  Administered 2020-04-30: 100 mL/h via INTRAVENOUS

## 2020-04-28 MED ORDER — AMLODIPINE BESYLATE 10 MG PO TABS
10.0000 mg | ORAL_TABLET | Freq: Every day | ORAL | Status: DC
  Administered 2020-04-29 – 2020-05-04 (×5): 10 mg via ORAL
  Filled 2020-04-28 (×6): qty 1

## 2020-04-28 MED ORDER — GABAPENTIN 100 MG PO CAPS
100.0000 mg | ORAL_CAPSULE | Freq: Every day | ORAL | Status: DC
  Administered 2020-04-28 – 2020-05-04 (×6): 100 mg via ORAL
  Filled 2020-04-28 (×7): qty 1

## 2020-04-28 MED ORDER — ONDANSETRON HCL 4 MG/2ML IJ SOLN: 4.0000 mg | Freq: Four times a day (QID) | INTRAMUSCULAR | Status: DC | PRN

## 2020-04-28 MED ORDER — ALUM & MAG HYDROXIDE-SIMETH 200-200-20 MG/5ML PO SUSP: 15.0000 mL | ORAL | Status: DC | PRN

## 2020-04-28 MED ORDER — OXYCODONE-ACETAMINOPHEN 5-325 MG PO TABS
1.0000 | ORAL_TABLET | ORAL | Status: DC | PRN
  Administered 2020-04-28 – 2020-05-03 (×20): 2 via ORAL
  Filled 2020-04-28 (×18): qty 2

## 2020-04-28 MED ORDER — BISACODYL 10 MG RE SUPP: 10.0000 mg | Freq: Every day | RECTAL | Status: DC | PRN

## 2020-04-28 MED ORDER — HYDRALAZINE HCL 20 MG/ML IJ SOLN: 5.0000 mg | INTRAMUSCULAR | Status: DC | PRN

## 2020-04-28 MED ORDER — FENTANYL CITRATE (PF) 100 MCG/2ML IJ SOLN
50.0000 ug | Freq: Once | INTRAMUSCULAR | Status: AC
  Administered 2020-04-28: 50 ug via INTRAVENOUS
  Filled 2020-04-28: qty 2

## 2020-04-28 MED ORDER — GUAIFENESIN-DM 100-10 MG/5ML PO SYRP: 15.0000 mL | ORAL_SOLUTION | ORAL | Status: DC | PRN

## 2020-04-28 MED ORDER — PANTOPRAZOLE SODIUM 40 MG PO TBEC
40.0000 mg | DELAYED_RELEASE_TABLET | Freq: Every day | ORAL | Status: DC
  Administered 2020-04-28 – 2020-05-04 (×6): 40 mg via ORAL
  Filled 2020-04-28 (×7): qty 1

## 2020-04-28 MED ORDER — POTASSIUM CHLORIDE CRYS ER 20 MEQ PO TBCR
20.0000 meq | EXTENDED_RELEASE_TABLET | Freq: Once | ORAL | Status: AC
  Administered 2020-04-28: 20 meq via ORAL
  Filled 2020-04-28: qty 2

## 2020-04-28 MED ORDER — IRBESARTAN 75 MG PO TABS
37.5000 mg | ORAL_TABLET | Freq: Every day | ORAL | Status: DC
  Administered 2020-04-29 – 2020-05-04 (×5): 37.5 mg via ORAL
  Filled 2020-04-28: qty 1
  Filled 2020-04-28 (×5): qty 0.5
  Filled 2020-04-28: qty 1

## 2020-04-28 MED ORDER — PREDNISONE 5 MG PO TABS
5.0000 mg | ORAL_TABLET | Freq: Every day | ORAL | Status: DC
  Administered 2020-04-28 – 2020-05-04 (×6): 5 mg via ORAL
  Filled 2020-04-28 (×8): qty 1

## 2020-04-28 MED ORDER — MORPHINE SULFATE (PF) 2 MG/ML IV SOLN
2.0000 mg | INTRAVENOUS | Status: DC | PRN
  Administered 2020-04-28: 2 mg via INTRAVENOUS
  Filled 2020-04-28: qty 1

## 2020-04-28 MED ORDER — METOPROLOL TARTRATE 5 MG/5ML IV SOLN: 2.0000 mg | INTRAVENOUS | Status: DC | PRN

## 2020-04-28 MED ORDER — NICOTINE 21 MG/24HR TD PT24: 21.0000 mg | MEDICATED_PATCH | Freq: Every day | TRANSDERMAL | Status: DC

## 2020-04-28 MED ORDER — PHENOL 1.4 % MT LIQD
1.0000 | OROMUCOSAL | Status: DC | PRN
  Filled 2020-04-28: qty 177

## 2020-04-28 MED ORDER — ATORVASTATIN CALCIUM 80 MG PO TABS
80.0000 mg | ORAL_TABLET | Freq: Every day | ORAL | Status: DC
  Administered 2020-04-29 – 2020-05-03 (×5): 80 mg via ORAL
  Filled 2020-04-28 (×4): qty 1
  Filled 2020-04-28: qty 8
  Filled 2020-04-28: qty 1

## 2020-04-28 MED ORDER — HEPARIN (PORCINE) 25000 UT/250ML-% IV SOLN
1450.0000 [IU]/h | INTRAVENOUS | Status: DC
  Administered 2020-04-28: 1250 [IU]/h via INTRAVENOUS
  Filled 2020-04-28 (×2): qty 250

## 2020-04-28 MED ORDER — LABETALOL HCL 5 MG/ML IV SOLN: 10.0000 mg | INTRAVENOUS | Status: DC | PRN

## 2020-04-28 MED ORDER — SENNOSIDES-DOCUSATE SODIUM 8.6-50 MG PO TABS: 1.0000 | ORAL_TABLET | Freq: Every evening | ORAL | Status: DC | PRN

## 2020-04-28 NOTE — ED Notes (Signed)
Per pharmacy, heparin gtt per hospital protocol starts 12 hrs after last dose of pt's home eliquis

## 2020-04-28 NOTE — ED Provider Notes (Signed)
[facility] EMERGENCY DEPARTMENT Provider Note   CSN: [id number] Arrival date & time: 04/28/20  1452     History Chief Complaint  Patient presents with   Leg Pain    [name] [name] is a 59 y.o. male. Presents to ER with concern for left leg, left foot pain. Patient reports symptoms started approximately 2 or 3 days ago, steadily worsening. Pain is worse in his lower leg, ankle and foot. Also is having slight tingling sensation, similar to prior episodes. Sharp stabbing, no alleviating factors. Has not taken medication for the pain.  Extensive peripheral vascular disease history. Status post femoropopliteal bypass, graft occluded in July 2021. Patient reports compliance with his Eliquis, no recent missed doses.  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   Hypertension    states is borderline on medication; has been on med. x 5-6 yr.   Hypokalemia 05/30/2018   Impingement syndrome of shoulder region 10/2014   left   Insomnia 06/01/2015   Ischemic ulcer of toe of left foot ([place])    Left shoulder pain 12/16/2015   Low back pain without sciatica 10/29/2009   Lupus ([place])    Peripheral vascular disease (HCC)    Pneumonia    RA (rheumatoid arthritis) ([place])    Rotator cuff tear 11/04/2014    Patient Active Problem List   Diagnosis Date Noted   Critical limb ischemia with history of revascularization of same extremity ([place]) 04/28/2020   Limb ischemia 01/23/2020   Ischemic leg 01/22/2020   History of COVID-19 05/10/2019   PAD (peripheral artery disease) ([place])    History of critical lower limb ischemia 02/19/2019   S/P lumbar fusion, Lower back pain 01/03/2018    Coronary artery disease involving native coronary artery of native heart with unstable angina pectoris [facility])    Preventative health care 03/13/2017   Transaminitis 12/16/2015   Bilateral shoulder pain 08/20/2012   Tobacco use disorder 08/20/2012   HTN (hypertension) 04/20/2011   HLD (hyperlipidemia) 10/08/2009   Rheumatoid arthritis ([place]) 08/11/2009    Past Surgical History:  Procedure Laterality Date   ABDOMINAL AORTAGRAM  02/20/2019   ABDOMINAL AORTOGRAM W/LOWER EXTREMITY N/A 02/20/2019   Procedure: ABDOMINAL AORTOGRAM W/LOWER EXTREMITY;  Surgeon: Marty Heck, MD;  Location: [place] CV LAB;  Service: Cardiovascular;  Laterality: N/A;   ACHILLES TENDON SURGERY Bilateral    FEMORAL-POPLITEAL BYPASS GRAFT Left 02/26/2019   Procedure: BYPASS GRAFT FEMORAL-POPLITEAL ARTERY LEFT LEG USING 69mm PROPATEN GRAFT;  Surgeon: Serafina Mitchell, MD;  Location: [place];  Service: Vascular;  Laterality: Left;   INTRAOPERATIVE ARTERIOGRAM Left 01/22/2020   Procedure: INTRA OPERATIVE ARTERIOGRAM with administration of thrombolyics in Left femoral to popliteal bypass.;  Surgeon: Marty Heck, MD;  Location: [place];  Service: Vascular;  Laterality: Left;   LEFT HEART CATH AND CORONARY ANGIOGRAPHY N/A 03/15/2017   Procedure: LEFT HEART CATH AND CORONARY ANGIOGRAPHY;  Surgeon: Nelva Bush, MD;  Location: [place] CV LAB;  Service: Cardiovascular;  Laterality: N/A;   OSTEOTOMY AND ULNAR SHORTENING Right 07/22/2002   PERIPHERAL VASCULAR ATHERECTOMY  01/23/2020   Procedure: PERIPHERAL VASCULAR ATHERECTOMY;  Surgeon:  Marty Heck, MD;  Location: [place] CV LAB;  Service: Cardiovascular;;   PERIPHERAL VASCULAR BALLOON ANGIOPLASTY  01/23/2020   Procedure: PERIPHERAL VASCULAR BALLOON ANGIOPLASTY;  Surgeon: Marty Heck, MD;  Location: [place] CV LAB;  Service: Cardiovascular;;   PERIPHERAL VASCULAR THROMBECTOMY N/A 01/23/2020   Procedure: lysis recheck;   Surgeon: Marty Heck, MD;  Location: [place] CV LAB;  Service: Cardiovascular;  Laterality: N/A;  + [place]    SHOULDER ARTHROSCOPY WITH BICEPSTENOTOMY Right 03/11/2015   Procedure: SHOULDER ARTHROSCOPY WITH BICEPSTENOTOMY;  Surgeon: Ninetta Lights, MD;  Location: [place];  Service: Orthopedics;  Laterality: Right;   SHOULDER ARTHROSCOPY WITH DISTAL CLAVICLE RESECTION Left 11/19/2014   Procedure: SHOULDER ARTHROSCOPY WITH DISTAL CLAVICLE RESECTION;  Surgeon: Kathryne Hitch, MD;  Location: [place];  Service: Orthopedics;  Laterality: Left;   SHOULDER ARTHROSCOPY WITH ROTATOR CUFF REPAIR AND SUBACROMIAL DECOMPRESSION Left 11/19/2014   Procedure: LEFT SHOULDER ARTHROSCOPY, DEBRIDEMENT DISTAL CLAVICLE EXCISION, ACROMIOPLASTY WITH ROTATOR CUFF REPAIR ;  Surgeon: Kathryne Hitch, MD;  Location: [place];  Service: Orthopedics;  Laterality: Left;   TRANSFORAMINAL LUMBAR INTERBODY FUSION (TLIF) WITH PEDICLE SCREW FIXATION 1 LEVEL N/A 01/03/2018   Procedure: TRANSFORAMINAL LUMBAR INTERBODY FUSION (TLIF) LUMBAR FIVE-SACRAL ONE;  Surgeon: Melina Schools, MD;  Location: [place];  Service: Orthopedics;  Laterality: N/A;   ULTRASOUND GUIDANCE FOR VASCULAR ACCESS Right 01/22/2020   Procedure: ULTRASOUND GUIDANCE FOR VASCULAR ACCESS Right Common Femoral Artery.;  Surgeon: Marty Heck, MD;  Location: [place];  Service: Vascular;  Laterality: Right;   VIDEO ASSISTED THORACOSCOPY (VATS)/DECORTICATION Right 11/21/2011   drainage of empyema       Family History  Problem Relation Age of Onset   Heart attack Father 62   Alcohol abuse Father    Aneurysm Mother    Stroke Neg Hx    Cancer Neg Hx     Social History   Tobacco Use   Smoking status: Current Every Day Smoker    Packs/day: 0.25    Years: 20.00    Pack years: 5.00    Types: Cigarettes    Last attempt to quit: 02/19/2019    Years since quitting: 1.1   Smokeless tobacco:  Never Used   Tobacco comment: 1-2 cigs per day; trying to quit  Vaping Use   Vaping Use: Never used  Substance Use Topics   Alcohol use: No    Alcohol/week: 0.0 standard drinks    Comment: sober 1998   Drug use: No    Home Medications Prior to Admission medications   Medication Sig Start Date End Date Taking? Authorizing Provider  amLODipine (NORVASC) 10 MG tablet Take 1 tablet (10 mg total) by mouth daily. 01/27/20  Yes Jose Persia, MD  apixaban (ELIQUIS) 5 MG TABS tablet Take 1 tablet (5 mg total) by mouth 2 (two) times daily. 01/24/20  Yes Baglia, Corrina, PA-C  ascorbic acid (VITAMIN C) 500 MG tablet Take 500 mg by mouth every other day.    Yes [provider]  atorvastatin (LIPITOR) 80 MG tablet Take 1 tablet (80 mg total) by mouth daily at 6 PM. 11/06/19  Yes Oda Kilts, MD  chlorthalidone (HYGROTON) 25 MG tablet Take 1 tablet (25 mg total) by mouth daily. 01/08/20  Yes Aslam, Loralyn Freshwater, MD  clopidogrel (PLAVIX) 75 MG tablet Take 1 tablet (75 mg total) by mouth daily. 11/06/19 11/05/20 Yes Oda Kilts, MD  docusate sodium (COLACE) 100 MG capsule Take 1  capsule (100 mg total) by mouth daily. Patient taking differently: Take 100 mg by mouth daily as needed for mild constipation.  02/27/19  Yes Santos-Sanchez, Merlene Morse, MD  gabapentin (NEURONTIN) 100 MG capsule Take 100 mg by mouth at bedtime as needed (for pain).  12/31/19  Yes [provider]  Glycerin-Polysorbate 80 (REFRESH DRY EYE THERAPY OP) Place 1 drop into both eyes 3 (three) times daily as needed (dry/irritated eyes.).   Yes [provider]  ibuprofen (ADVIL) 200 MG tablet Take 200-600 mg by mouth every 6 (six) hours as needed for headache or mild pain.   Yes [provider]  naloxone [facility]) 2 MG/2ML injection Inject 2 mg into the muscle once as needed (opiod overdose (call 911, inject intramuscularly in shoulder or thigh. Repeat every 3 minutes)).    Yes [provider]    oxyCODONE-acetaminophen (PERCOCET) 10-325 MG tablet Take 1 tablet by mouth 4 (four) times daily as needed for pain.  04/15/19  Yes [provider]  predniSONE (DELTASONE) 5 MG tablet TAKE 1 TABLET BY MOUTH EVERY DAY WITH BREAKFAST Patient taking differently: Take 5 mg by mouth daily.  01/22/20  Yes Mosetta Anis, MD  valsartan (DIOVAN) 40 MG tablet Take 40 mg by mouth daily. 02/03/20  Yes [provider]  nicotine (NICODERM CQ - DOSED IN MG/24 HOURS) 21 mg/24hr patch Place 1 patch (21 mg total) onto the skin daily. Patient not taking: Reported on 04/28/2020 02/28/19   Welford Roche, MD  pantoprazole (PROTONIX) 40 MG tablet TAKE 1 TABLET EVERY DAY Patient not taking: Reported on 04/28/2020 07/28/19   Jean Rosenthal, MD    Allergies    Morphine and related, Ramipril, and Tramadol  Review of Systems   Review of Systems  Constitutional: Negative for chills and fever.  HENT: Negative for ear pain and sore throat.   Eyes: Negative for pain and visual disturbance.  Respiratory: Negative for cough and shortness of breath.   Cardiovascular: Negative for chest pain and palpitations.  Gastrointestinal: Negative for abdominal pain and vomiting.  Genitourinary: Negative for dysuria and hematuria.  Musculoskeletal: Positive for arthralgias and myalgias. Negative for back pain.  Skin: Negative for color change and rash.  Neurological: Negative for seizures and syncope.  All other systems reviewed and are negative.   Physical Exam Updated Vital Signs BP 101/72    Pulse 93    Temp 98.5 F (36.9 C) (Oral)    Resp 15    Ht 5\' 8"  (1.727 m)    Wt 79.4 kg    SpO2 98%    BMI 26.61 kg/m   Physical Exam Vitals and nursing note reviewed.  Constitutional:      Appearance: He is well-developed.  HENT:     Head: Normocephalic and atraumatic.  Eyes:     Conjunctiva/sclera: Conjunctivae normal.  Cardiovascular:     Rate and Rhythm: Normal rate and regular rhythm.     Heart sounds: No  murmur heard.   Pulmonary:     Effort: Pulmonary effort is normal. No respiratory distress.     Breath sounds: Normal breath sounds.  Abdominal:     Palpations: Abdomen is soft.     Tenderness: There is no abdominal tenderness.  Musculoskeletal:        General: No deformity or signs of injury.     Cervical back: Neck supple.     Comments: LLE: somewhat cool, motor and sensation grossly intact; unable to palpate or doppler DP or PT pulse  RLE: wam, motor sensation intact, good DP pulse  Skin:    General: Skin is warm and dry.  Neurological:     Mental Status: He is alert.     ED Results / Procedures / Treatments   Labs (all labs ordered are listed, but only abnormal results are displayed) Labs Reviewed  CBC - Abnormal; Notable for the following components:      Result Value   Hemoglobin 11.5 (*)    HCT 38.3 (*)    MCV 78.5 (*)    MCH 23.6 (*)    RDW 16.0 (*)    All other components within normal limits  BASIC METABOLIC PANEL - Abnormal; Notable for the following components:   Potassium 3.4 (*)    Chloride 96 (*)    Glucose, Bld 110 (*)    BUN 26 (*)    Creatinine, Ser 1.51 (*)    GFR, Estimated 53 (*)    All other components within normal limits  RESPIRATORY PANEL BY RT PCR (FLU A&B, COVID)  PROTIME-INR  APTT  CBC  APTT  HIV ANTIBODY (ROUTINE TESTING W REFLEX)  TYPE AND SCREEN    EKG None  Radiology No results found.  Procedures .Critical Care Performed by: Lucrezia Starch, MD Authorized by: Lucrezia Starch, MD   Critical care provider statement:    Critical care time (minutes):  48   Critical care was necessary to treat or prevent imminent or life-threatening deterioration of the following conditions:  Circulatory failure   Critical care was time spent personally by me on the following activities:  Discussions with consultants, evaluation of patient's response to treatment, examination of patient, ordering and performing treatments and  interventions, ordering and review of laboratory studies, ordering and review of radiographic studies, pulse oximetry, re-evaluation of patient's condition, obtaining history from patient or surrogate and review of old charts   (including critical care time)  Medications Ordered in ED Medications  heparin ADULT infusion 100 units/mL (25000 units/267mL sodium chloride 0.45%) (has no administration in time range)  potassium chloride SA (KLOR-CON) CR tablet 20-40 mEq (has no administration in time range)  ondansetron (ZOFRAN) injection 4 mg (has no administration in time range)  alum & mag hydroxide-simeth (MAALOX/MYLANTA) 200-200-20 MG/5ML suspension 15-30 mL (has no administration in time range)  pantoprazole (PROTONIX) EC tablet 40 mg (has no administration in time range)  labetalol (NORMODYNE) injection 10 mg (has no administration in time range)  hydrALAZINE (APRESOLINE) injection 5 mg (has no administration in time range)  metoprolol tartrate (LOPRESSOR) injection 2-5 mg (has no administration in time range)  guaiFENesin-dextromethorphan (ROBITUSSIN DM) 100-10 MG/5ML syrup 15 mL (has no administration in time range)  phenol (CHLORASEPTIC) mouth spray 1 spray (has no administration in time range)  0.9 %  sodium chloride infusion (has no administration in time range)  oxyCODONE-acetaminophen (PERCOCET/ROXICET) 5-325 MG per tablet 1-2 tablet (has no administration in time range)  morphine 2 MG/ML injection 2-5 mg (has no administration in time range)  senna-docusate (Senokot-S) tablet 1 tablet (has no administration in time range)  bisacodyl (DULCOLAX) suppository 10 mg (has no administration in time range)  amLODipine (NORVASC) tablet 10 mg (has no administration in time range)  atorvastatin (LIPITOR) tablet 80 mg (has no administration in time range)  gabapentin (NEURONTIN) capsule 100 mg (has no administration in time range)  nicotine (NICODERM CQ - dosed in mg/24 hours) patch 21 mg (has  no administration in time range)  predniSONE (DELTASONE) tablet 5 mg (has no  administration in time range)  irbesartan (AVAPRO) tablet 37.5 mg (has no administration in time range)  fentaNYL (SUBLIMAZE) injection 50 mcg (50 mcg Intravenous Given 04/28/20 1639)    ED Course  I have reviewed the triage vital signs and the nursing notes.  Pertinent labs & imaging results that were available during my care of the patient were reviewed by me and considered in my medical decision making (see chart for details).  Clinical Course as of Apr 28 1816  Wed Apr 28, 2020  1644 Vascular surgery Early at bedside   [RD]    Clinical Course User Index [RD] Lucrezia Starch, MD   MDM Rules/Calculators/A&P                          59 year old male presenting to ER with concern for pain in left lower leg. On exam, leg is somewhat cool, unable to palpate or Doppler pulses. Consulted vascular surgery, Dr. Donnetta Hutching came to bedside. Likely recurrent occlusion of his femoral-popliteal bypass graft. Vascular recommending starting on heparin drip. They will admit patient, plan for attempt to lyse his bypass tomorrow.   Final Clinical Impression(s) / ED Diagnoses Final diagnoses:  Limb ischemia  Occlusion of femoropopliteal bypass graft, initial encounter [facility])    Rx / DC Orders ED Discharge Orders    None       Lucrezia Starch, MD 04/28/20 [phone]

## 2020-04-28 NOTE — ED Notes (Signed)
Roomed pt and placed on cardiac monitor. Pt states pain started this am when he woke up in left lower extremity. Pt reports trying to find a ride and was unable to so he called EMS.   Pt has extensive vascular hx to left lower extremity. Pt takes eliquis and reports he has been compliant with no missed doses.

## 2020-04-28 NOTE — H&P (Signed)
[facility] [place]  Patient name: [name] [name] MRN: [id number] DOB: 15-Jun-1961 Sex: male  REASON FOR VISIT: Patient presents to the emergency room with pain in his left foot.  HPI: [name] [name] is a 59 y.o. male he is status post prior left femoral to popliteal bypass by Dr. Trula Slade on 02/26/2019.  He had presented with critical limb ischemia.  His vein at that time was not adequate for bypass and he had a Gore-Tex prosthetic graft to the below-knee popliteal artery.  He had flush occlusion of the superficial femoral artery in the groin.  He presented in late July 2021 with recurrent ischemia.  He was found to have occlusion of his femoral to popliteal bypass and underwent lytic therapy and tenting of the distal anastomosis.  He was recently seen in our office by Dr. Trula Slade with a patent graft on 02/23/2020.  He reports that he noted over the last 2 to 3 days that he has had pain in his foot.  This has been somewhat progressive.  He presented to the emergency room.  Initially he thought this was his arthritis.  The patient does have rheumatoid arthritis.  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   Hypertension    states is borderline on medication; has been on med. x 5-6 yr.   Hypokalemia 05/30/2018   Impingement syndrome of shoulder region 10/2014   left   Insomnia 06/01/2015   Ischemic ulcer of toe of left foot ([place])    Left shoulder pain 12/16/2015   Low back pain without sciatica 10/29/2009   Lupus (HCC)    Peripheral vascular disease (HCC)    Pneumonia    RA (rheumatoid arthritis) ([place])    Rotator cuff tear 11/04/2014    Family History   Problem Relation Age of Onset   Heart attack Father 31   Alcohol abuse Father    Aneurysm Mother    Stroke Neg Hx    Cancer Neg Hx     SOCIAL HISTORY: Social History   Tobacco Use   Smoking status: Current Every Day Smoker    Packs/day: 0.25    Years: 20.00    Pack years: 5.00    Types: Cigarettes    Last attempt to quit: 02/19/2019    Years since quitting: 1.1   Smokeless tobacco: Never Used   Tobacco comment: 1-2 cigs per day; trying to quit  Substance Use Topics   Alcohol use: No    Alcohol/week: 0.0 standard drinks    Comment: sober 1998    Allergies  Allergen Reactions   Morphine And Related Rash   Ramipril Rash    Per patient on R arm and leg only; no angioedema   Tramadol Itching and Rash    Current Facility-Administered Medications  Medication Dose Route Frequency Provider Last Rate Last Admin   heparin ADULT infusion 100 units/mL (25000 units/265mL sodium chloride 0.45%)  1,250 Units/hr Intravenous Continuous Rebbeca Paul B, RPH       Current Outpatient Medications  Medication Sig Dispense Refill   amLODipine (NORVASC) 10 MG tablet Take 1 tablet (10 mg total) by mouth daily. 90 tablet  1   apixaban (ELIQUIS) 5 MG TABS tablet Take 1 tablet (5 mg total) by mouth 2 (two) times daily. 60 tablet 3   ascorbic acid (VITAMIN C) 500 MG tablet Take 500 mg by mouth daily.      atorvastatin (LIPITOR) 80 MG tablet Take 1 tablet (80 mg total) by mouth daily at 6 PM. 30 tablet 11   chlorthalidone (HYGROTON) 25 MG tablet Take 1 tablet (25 mg total) by mouth daily. 30 tablet 5   clopidogrel (PLAVIX) 75 MG tablet Take 1 tablet (75 mg total) by mouth daily. 30 tablet 11   docusate sodium (COLACE) 100 MG capsule Take 1 capsule (100 mg total) by mouth daily. 10 capsule 0   gabapentin (NEURONTIN) 100 MG capsule Take 100 mg by mouth daily.      Glycerin-Polysorbate 80 (REFRESH DRY EYE THERAPY OP) Place 1 drop into both eyes 3 (three) times daily as needed  (dry/irritated eyes.).     Multiple Vitamin (MULTI-VITAMINS) TABS Take 1 tablet by mouth daily.      naloxone (NARCAN) 2 MG/2ML injection Inject 2 mg into the muscle once as needed (opiod overdose (call 911, inject intramuscularly in shoulder or thigh. Repeat every 3 minutes)).      nicotine (NICODERM CQ - DOSED IN MG/24 HOURS) 21 mg/24hr patch Place 1 patch (21 mg total) onto the skin daily. 28 patch 0   oxyCODONE-acetaminophen (PERCOCET) 10-325 MG tablet Take 1 tablet by mouth every 8 (eight) hours as needed for pain.      pantoprazole (PROTONIX) 40 MG tablet TAKE 1 TABLET EVERY DAY (Patient taking differently: Take 40 mg by mouth daily. ) 30 tablet 1   predniSONE (DELTASONE) 5 MG tablet TAKE 1 TABLET BY MOUTH EVERY DAY WITH BREAKFAST (Patient taking differently: Take 5 mg by mouth daily. ) 30 tablet 5   valsartan (DIOVAN) 40 MG tablet Take 40 mg by mouth daily.       PHYSICAL EXAM: Vitals:   04/28/20 1514 04/28/20 1533  BP: 101/72   Pulse: 93   Resp: 15   Temp: 98.5 F (36.9 C)   TempSrc: Oral   SpO2: 98%   Weight:  79.4 kg  Height:  5\' 8"  (1.727 m)    GENERAL: The patient is a well-nourished male, in no acute distress. The vital signs are documented above. CARDIOVASCULAR: 2+ femoral pulses bilaterally.  Absent pedal pulses bilaterally PULMONARY: There is good air exchange  MUSCULOSKELETAL: There are no major deformities or cyanosis. NEUROLOGIC: No focal weakness or paresthesias are detected. SKIN: There are no ulcers or rashes noted. PSYCHIATRIC: The patient has a normal affect.  DATA:  I do not hear Doppler flow by pencil Doppler into his left foot.  MEDICAL ISSUES: Recurrent occlusion of his left femoral to popliteal bypass.  His calf is soft and he does have motor and sensory intact into his foot.  Discussed this with the patient.  He will be started on heparin drip.  His Eliquis will be held.  He will be admitted and will undergo attempted lysis of his bypass  tomorrow.  He understands that this is limb threatening.    Rosetta Posner, MD FACS [facility] Tel [phone]

## 2020-04-28 NOTE — Progress Notes (Signed)
[place] for Heparin Indication: leg ischemia, likely occluded fempop bypass graft  Allergies  Allergen Reactions  . Morphine And Related Rash  . Ramipril Rash    Per patient on R arm and leg only; no angioedema  . Tramadol Itching and Rash    Patient Measurements: Height: 5\' 8"  (172.7 cm) Weight: 79.4 kg (175 lb) IBW/kg (Calculated) : 68.4 Heparin Dosing Weight: 79.4 kg  Vital Signs: Temp: 98.5 F (36.9 C) (11/03 1514) Temp Source: Oral (11/03 1514) BP: 101/72 (11/03 1514) Pulse Rate: 93 (11/03 1514)  Labs: Recent Labs    04/28/20 1550  HGB 11.5*  HCT 38.3*  PLT 254  CREATININE 1.51*    Estimated Creatinine Clearance: 51 mL/min (A) (by C-G formula based on SCr of 1.51 mg/dL (H)).   Medical History: Past Medical History:  Diagnosis Date  . Anemia   . Anemia 08/06/2012  . 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 Community acquired pneumonia 11/07/2011  . DDD (degenerative disc disease), lumbosacral     and grade 2 slip  . GERD (gastroesophageal reflux disease)   . Heart murmur   . History of anemia    no current med.  . Hypertension    states is borderline on medication; has been on med. x 5-6 yr.  . Hypokalemia 05/30/2018  . Impingement syndrome of shoulder region 10/2014   left  . Insomnia 06/01/2015  . Ischemic ulcer of toe of left foot ([place])   . Left shoulder pain 12/16/2015  . Low back pain without sciatica 10/29/2009  . Lupus ([place])   . Peripheral vascular disease ([place])   . Pneumonia   . RA (rheumatoid arthritis) ([place])   . Rotator cuff tear 11/04/2014   Assessment: 59 y.o. male admitted for leg ischemia with likely occluded fempop bypass graft. Surgery planned for tomorrow. Patient reports last taking pta Eliquis 04/28/20 at 1130. Pharmacy has been consulted for heparin dosing. Will schedule heparin to start when next Eliquis dose would have been due. Will monitor aPTT as DOAC will falsely elevate heparin levels.   Goal of  Therapy:  Heparin level 0.3-0.7 units/ml aPTT 66-102 seconds Monitor platelets by anticoagulation protocol: Yes   Plan:  Start IV heparin 1250 units/hr 6h aPTT Daily CBC, aPTT F/u anticoag plans following procedure  Rebbeca Paul, PharmD PGY1 Pharmacy Resident 04/28/2020 4:56 PM  Please check [email or web address] for unit-specific pharmacy phone numbers.

## 2020-04-28 NOTE — ED Triage Notes (Signed)
Patient arrives to ED with complaints of pain to left foot that started when he woke up this morning. Pt states that his left foot is painful. The left foot is cool to the touch, pale, and has loss of hair. Pt has stents placed in leg and had multiple vascular surgeries, and follows up with vascular and vain specialists.

## 2020-04-28 NOTE — ED Notes (Signed)
Report called transporting pt to floor.

## 2020-04-28 NOTE — ED Notes (Signed)
Per Dr. Donnetta Hutching, pt allowed to eat

## 2020-04-29 ENCOUNTER — Inpatient Hospital Stay (HOSPITAL_COMMUNITY)
Admission: EM | Disposition: A | Discharge status: Home Health | Payer: Self-pay | Source: Home / Self Care | Attending: Vascular Surgery

## 2020-04-29 DIAGNOSIS — T82858A Stenosis of vascular prosthetic devices, implants and grafts, initial encounter: Secondary | ICD-10-CM

## 2020-04-29 HISTORY — PX: LOWER EXTREMITY INTERVENTION: CATH118252

## 2020-04-29 LAB — CBC
HCT: 35.1 % — ABNORMAL LOW (ref 39.0–52.0)
HCT: 33.8 % — ABNORMAL LOW (ref 39.0–52.0)
Hemoglobin: 11 g/dL — ABNORMAL LOW (ref 13.0–17.0)
Hemoglobin: 10.5 g/dL — ABNORMAL LOW (ref 13.0–17.0)
MCH: 24.2 pg — ABNORMAL LOW (ref 26.0–34.0)
MCH: 24 pg — ABNORMAL LOW (ref 26.0–34.0)
MCHC: 31.3 g/dL (ref 30.0–36.0)
MCHC: 31.1 g/dL (ref 30.0–36.0)
MCV: 77.1 fL — ABNORMAL LOW (ref 80.0–100.0)
MCV: 77.2 fL — ABNORMAL LOW (ref 80.0–100.0)
Platelets: 255 10*3/uL (ref 150–400)
Platelets: 215 10*3/uL (ref 150–400)
RBC: 4.55 MIL/uL (ref 4.22–5.81)
RBC: 4.38 MIL/uL (ref 4.22–5.81)
RDW: 15.9 % — ABNORMAL HIGH (ref 11.5–15.5)
RDW: 15.8 % — ABNORMAL HIGH (ref 11.5–15.5)
WBC: 7.1 10*3/uL (ref 4.0–10.5)
WBC: 9.7 10*3/uL (ref 4.0–10.5)
nRBC: 0 % (ref 0.0–0.2)
nRBC: 0 % (ref 0.0–0.2)

## 2020-04-29 LAB — BASIC METABOLIC PANEL
Anion gap: 11 (ref 5–15)
BUN: 20 mg/dL (ref 6–20)
CO2: 25 mmol/L (ref 22–32)
Calcium: 9 mg/dL (ref 8.9–10.3)
Chloride: 98 mmol/L (ref 98–111)
Creatinine, Ser: 1.13 mg/dL (ref 0.61–1.24)
GFR, Estimated: >60 mL/min (ref >60)
Glucose, Bld: 133 mg/dL — ABNORMAL HIGH (ref 70–99)
Potassium: 3.3 mmol/L — ABNORMAL LOW (ref 3.5–5.1)
Sodium: 134 mmol/L — ABNORMAL LOW (ref 135–145)

## 2020-04-29 LAB — HEPARIN LEVEL (UNFRACTIONATED)
Heparin Unfractionated: <0.1 IU/mL — ABNORMAL LOW (ref 0.30–0.70)
Heparin Unfractionated: <0.1 [IU]/mL — ABNORMAL LOW (ref 0.30–0.70)

## 2020-04-29 LAB — POCT ACTIVATED CLOTTING TIME: Activated Clotting Time: 186 s

## 2020-04-29 LAB — APTT: aPTT: 37 seconds — ABNORMAL HIGH (ref 24–36)

## 2020-04-29 LAB — FIBRINOGEN: Fibrinogen: 344 mg/dL (ref 210–475)

## 2020-04-29 SURGERY — LOWER EXTREMITY INTERVENTION
Anesthesia: LOCAL | Laterality: Left

## 2020-04-29 MED ORDER — SODIUM CHLORIDE 0.9% FLUSH
10.0000 mL | Freq: Two times a day (BID) | INTRAVENOUS | Status: DC
  Administered 2020-04-30 – 2020-05-04 (×4): 10 mL

## 2020-04-29 MED ORDER — SODIUM CHLORIDE 0.9% FLUSH
10.0000 mL | INTRAVENOUS | Status: DC | PRN
  Administered 2020-05-01: 30 mL

## 2020-04-29 MED ORDER — SODIUM CHLORIDE 0.9 % IV SOLN
1.0000 mg/h | INTRAVENOUS | Status: DC
  Administered 2020-04-29 – 2020-04-30 (×2): 1 mg/h
  Filled 2020-04-29 (×5): qty 10

## 2020-04-29 MED ORDER — HEPARIN (PORCINE) 25000 UT/250ML-% IV SOLN
1200.0000 [IU]/h | INTRAVENOUS | Status: DC
  Administered 2020-04-29 (×2): 800 [IU]/h via INTRAVENOUS
  Filled 2020-04-29: qty 250

## 2020-04-29 MED ORDER — HEPARIN (PORCINE) IN NACL 1000-0.9 UT/500ML-% IV SOLN
INTRAVENOUS | Status: AC
  Filled 2020-04-29: qty 1000

## 2020-04-29 MED ORDER — FENTANYL CITRATE (PF) 100 MCG/2ML IJ SOLN
INTRAMUSCULAR | Status: AC
  Filled 2020-04-29: qty 2

## 2020-04-29 MED ORDER — MIDAZOLAM HCL 2 MG/2ML IJ SOLN
INTRAMUSCULAR | Status: AC
  Filled 2020-04-29: qty 2

## 2020-04-29 MED ORDER — OXYCODONE-ACETAMINOPHEN 5-325 MG PO TABS
ORAL_TABLET | ORAL | Status: AC
  Filled 2020-04-29: qty 2

## 2020-04-29 MED ORDER — SODIUM CHLORIDE 0.9 % IV SOLN: 250.0000 mL | INTRAVENOUS | Status: DC | PRN

## 2020-04-29 MED ORDER — SODIUM CHLORIDE 0.9% FLUSH: 3.0000 mL | INTRAVENOUS | Status: DC | PRN

## 2020-04-29 MED ORDER — HEPARIN SODIUM (PORCINE) 1000 UNIT/ML IJ SOLN
INTRAMUSCULAR | Status: DC | PRN
  Administered 2020-04-29: 3000 [IU] via INTRAVENOUS
  Administered 2020-04-29: 4000 [IU] via INTRAVENOUS

## 2020-04-29 MED ORDER — SODIUM CHLORIDE 0.9% FLUSH
3.0000 mL | Freq: Two times a day (BID) | INTRAVENOUS | Status: DC
  Administered 2020-04-30 (×2): 3 mL via INTRAVENOUS

## 2020-04-29 MED ORDER — MIDAZOLAM HCL 2 MG/2ML IJ SOLN
INTRAMUSCULAR | Status: DC | PRN
  Administered 2020-04-29: 1 mg via INTRAVENOUS

## 2020-04-29 MED ORDER — FENTANYL CITRATE (PF) 100 MCG/2ML IJ SOLN
INTRAMUSCULAR | Status: DC | PRN
  Administered 2020-04-29 (×2): 50 ug via INTRAVENOUS

## 2020-04-29 MED ORDER — POTASSIUM CHLORIDE CRYS ER 20 MEQ PO TBCR
60.0000 meq | EXTENDED_RELEASE_TABLET | Freq: Once | ORAL | Status: AC
  Administered 2020-04-29: 60 meq via ORAL
  Filled 2020-04-29: qty 3

## 2020-04-29 MED ORDER — HYDROMORPHONE HCL 1 MG/ML IJ SOLN
0.5000 mg | INTRAMUSCULAR | Status: DC | PRN
  Administered 2020-04-29 – 2020-05-03 (×7): 0.5 mg via INTRAVENOUS
  Filled 2020-04-29: qty 1
  Filled 2020-04-29 (×2): qty 0.5
  Filled 2020-04-29 (×2): qty 1
  Filled 2020-04-29: qty 0.5
  Filled 2020-04-29: qty 1

## 2020-04-29 MED ORDER — LIDOCAINE HCL (PF) 1 % IJ SOLN
INTRAMUSCULAR | Status: AC
  Filled 2020-04-29: qty 30

## 2020-04-29 MED ORDER — CHLORHEXIDINE GLUCONATE CLOTH 2 % EX PADS
6.0000 | MEDICATED_PAD | Freq: Every day | CUTANEOUS | Status: DC
  Administered 2020-05-01 – 2020-05-04 (×4): 6 via TOPICAL

## 2020-04-29 MED ORDER — LIDOCAINE HCL (PF) 1 % IJ SOLN
INTRAMUSCULAR | Status: DC | PRN
  Administered 2020-04-29: 15 mL

## 2020-04-29 MED ORDER — ONDANSETRON HCL 4 MG/2ML IJ SOLN: 4.0000 mg | Freq: Four times a day (QID) | INTRAMUSCULAR | Status: DC | PRN

## 2020-04-29 MED ORDER — MIDAZOLAM HCL 2 MG/2ML IJ SOLN
1.0000 mg | INTRAMUSCULAR | Status: DC | PRN
  Administered 2020-05-03: 1 mg via INTRAVENOUS
  Filled 2020-04-29: qty 2

## 2020-04-29 SURGICAL SUPPLY — 16 items
CATH ANGIO 5F BER2 100CM (CATHETERS) ×1 IMPLANT
CATH INFUS 135CMX50CM (CATHETERS) ×1 IMPLANT
CATH OMNI FLUSH 5F 65CM (CATHETERS) ×1 IMPLANT
CATH QUICKCROSS .018X135CM (MICROCATHETER) ×1 IMPLANT
DEVICE TORQUE H2O (MISCELLANEOUS) ×1 IMPLANT
GLIDEWIRE ADV .035X180CM (WIRE) ×1 IMPLANT
KIT MICROPUNCTURE NIT STIFF (SHEATH) ×1 IMPLANT
KIT PV (KITS) ×2 IMPLANT
SHEATH FLEX ANSEL ST 6FR 45CM (SHEATH) ×1 IMPLANT
SHEATH PINNACLE 5F 10CM (SHEATH) ×1 IMPLANT
SHEATH PROBE COVER 6X72 (BAG) ×1 IMPLANT
SYR MEDRAD MARK V 150ML (SYRINGE) ×1 IMPLANT
TRANSDUCER W/STOPCOCK (MISCELLANEOUS) ×2 IMPLANT
TRAY PV CATH (CUSTOM PROCEDURE TRAY) ×2 IMPLANT
WIRE BENTSON .035X145CM (WIRE) ×1 IMPLANT
WIRE G V18X300CM (WIRE) ×1 IMPLANT

## 2020-04-29 NOTE — Progress Notes (Signed)
[place] for Heparin Indication: leg ischemia, likely occluded fempop bypass graft  Allergies  Allergen Reactions  . Morphine And Related Rash  . Ramipril Rash and Other (See Comments)    Per patient on R arm and leg only; no angioedema  . Tramadol Itching and Rash    Patient Measurements: Height: 5\' 8"  (172.7 cm) Weight: 79.4 kg (175 lb) IBW/kg (Calculated) : 68.4 Heparin Dosing Weight: 79.4 kg  Vital Signs: Temp: 97.9 F (36.6 C) (11/04 1155) Temp Source: Oral (11/04 1155) BP: 135/80 (11/04 1655) Pulse Rate: 88 (11/04 1655)  Labs: Recent Labs    04/28/20 1550 04/28/20 1630 04/29/20 0414 04/29/20 0757  HGB 11.5*  --   --  11.0*  HCT 38.3*  --   --  35.1*  PLT 254  --   --  255  APTT  --  33  --  37*  LABPROT  --  13.5  --   --   INR  --  1.1  --   --   HEPARINUNFRC  --   --   --  <0.10*  CREATININE 1.51*  --  1.13  --     Estimated Creatinine Clearance: 68.1 mL/min (by C-G formula based on SCr of 1.13 mg/dL).  Assessment: 59 y.o. male admitted for leg ischemia with likely occluded fempop bypass graft. Patient reports last taking pta Eliquis 04/28/20 at 1130.   Now on lysis with alteplase at 1 mg/hr Came back to ICU from cath lab on 800 units/hr heparin  Goal of Therapy:  Hep lvl 0.2 - 0.5 during lysis   Plan:  Continue heparin 800 units/hr 2100 hep lvl  Barth Kirks, PharmD, BCPS, BCCCP Clinical Pharmacist [phone]  Please check AMION for all [place] numbers  04/29/2020 5:08 PM

## 2020-04-29 NOTE — Progress Notes (Signed)
    Subjective  -   Patient admitted last night with left leg rest pain and occluded left femoral-popliteal bypass graft.  He had no acute overnight events.  He does not report any changes in his symptoms   Physical Exam:  Left leg is ruborous. Nonpalpable pulses. Motor function is present but decreased. Sensation is present but he says it feels different than normal       Assessment/Plan:   Occluded left femoral-popliteal bypass graft: His bypass graft was performed for rest pain with ulceration.  It occluded several months ago and he underwent successful thrombolysis and stenting of the distal anastomosis.  Repeat attempted lytic therapy will be performed today.  He states that he has been compliant with his anticoagulation regimen, and that he has quit smoking.  [name] [name] 04/29/2020 8:26 AM --  Vitals:   04/29/20 0336 04/29/20 0807  BP: 95/62 123/87  Pulse: 85 87  Resp: 16 14  Temp: 98.2 F (36.8 C) 97.8 F (36.6 C)  SpO2: 92% 91%    Intake/Output Summary (Last 24 hours) at 04/29/2020 0826 Last data filed at 04/29/2020 0500 Gross per 24 hour  Intake 200 ml  Output 500 ml  Net -300 ml     Laboratory CBC    Component Value Date/Time   WBC 9.5 04/28/2020 1550   HGB 11.5 (L) 04/28/2020 1550   HGB 13.9 03/13/2017 1032   HCT 38.3 (L) 04/28/2020 1550   HCT 41.6 03/13/2017 1032   PLT 254 04/28/2020 1550   PLT 204 03/13/2017 1032    BMET    Component Value Date/Time   NA 134 (L) 04/29/2020 0414   NA 140 05/30/2018 1413   K 3.3 (L) 04/29/2020 0414   CL 98 04/29/2020 0414   CO2 25 04/29/2020 0414   GLUCOSE 133 (H) 04/29/2020 0414   BUN 20 04/29/2020 0414   BUN 9 05/30/2018 1413   CREATININE 1.13 04/29/2020 0414   CREATININE 0.80 11/27/2012 0956   CALCIUM 9.0 04/29/2020 0414   GFRNONAA >60 04/29/2020 0414   GFRNONAA >89 11/27/2012 0956   GFRAA >60 01/24/2020 0202   GFRAA >89 11/27/2012 0956    COAG Lab Results  Component Value Date   INR 1.1  04/28/2020   INR 1.0 02/26/2019   INR 1.0 02/19/2019   No results found for: PTT  Antibiotics Anti-infectives (From admission, onward)   None       V. Leia Alf, M.D., [facility] [place] Office: [phone] Pager:  [phone]

## 2020-04-29 NOTE — Progress Notes (Signed)
[place] for Heparin Indication: leg ischemia, likely occluded fempop bypass graft  Allergies  Allergen Reactions  . Morphine And Related Rash  . Ramipril Rash and Other (See Comments)    Per patient on R arm and leg only; no angioedema  . Tramadol Itching and Rash    Patient Measurements: Height: 5\' 8"  (172.7 cm) Weight: 79.4 kg (175 lb) IBW/kg (Calculated) : 68.4 Heparin Dosing Weight: 79.4 kg  Vital Signs: Temp: 97.8 F (36.6 C) (11/04 0807) Temp Source: Oral (11/04 0807) BP: 123/87 (11/04 0807) Pulse Rate: 87 (11/04 0807)  Labs: Recent Labs    04/28/20 1550 04/28/20 1630 04/29/20 0414 04/29/20 0757  HGB 11.5*  --   --  11.0*  HCT 38.3*  --   --  35.1*  PLT 254  --   --  255  APTT  --  33  --  37*  LABPROT  --  13.5  --   --   INR  --  1.1  --   --   HEPARINUNFRC  --   --   --  <0.10*  CREATININE 1.51*  --  1.13  --     Estimated Creatinine Clearance: 68.1 mL/min (by C-G formula based on SCr of 1.13 mg/dL).   Medical History: Past Medical History:  Diagnosis Date  . Anemia   . Anemia 08/06/2012  . 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 Community acquired pneumonia 11/07/2011  . DDD (degenerative disc disease), lumbosacral     and grade 2 slip  . GERD (gastroesophageal reflux disease)   . Heart murmur   . History of anemia    no current med.  . Hypertension    states is borderline on medication; has been on med. x 5-6 yr.  . Hypokalemia 05/30/2018  . Impingement syndrome of shoulder region 10/2014   left  . Insomnia 06/01/2015  . Ischemic ulcer of toe of left foot ([place])   . Left shoulder pain 12/16/2015  . Low back pain without sciatica 10/29/2009  . Lupus ([place])   . Peripheral vascular disease ([place])   . Pneumonia   . RA (rheumatoid arthritis) ([place])   . Rotator cuff tear 11/04/2014   Assessment: 59 y.o. male admitted for leg ischemia with likely occluded fempop bypass graft. Patient reports last taking pta Eliquis 04/28/20  at 1130. Pharmacy has been consulted for heparin dosing. Plans for lysis today -heparin level < 0.1 (possible concern for compliance with apixaban at home)  Goal of Therapy:  Heparin level 0.3-0.7 units/ml aPTT 66-102 seconds Monitor platelets by anticoagulation protocol: Yes   Plan:  Increase heparin to 1450 units/hr F/u anticoag plans following procedure  Hildred Laser, PharmD Clinical Pharmacist **Pharmacist phone directory can now be found on [email or web address] ([email or web address]).  Listed under [place].

## 2020-04-29 NOTE — Op Note (Signed)
Patient name: [name] [name] MRN: [id number] DOB: 02/27/1961 Sex: male  04/28/2020 - 04/29/2020 Pre-operative Diagnosis: Acute on chronic limb ischemia of the left lower extremity with occluded common femoral to below-knee popliteal prosthetic bypass Post-operative diagnosis:  Same Surgeon:  Marty Heck, MD Procedure Performed: 1.  Ultrasound-guided access of the right common femoral artery 2.  Aortogram including catheter selection of aorta 3.  Left lower extreme arteriogram with selection of third order branches 4.  Placement of thrombolytic's catheter in the left common femoral to below-knee popliteal prosthetic bypass with initiation of catheter directed thrombolytics 5.  51 minutes of monitored moderate conscious sedation time  Indications: 59 year old male who presented to the ED last night with acute left foot pain.  Ultimately found to have occluded left common femoral to below-knee prosthetic bypass.  This bypass has previously undergone thrombolysis for bypass and limb salvage.  Risks and benefits discussed.  Findings:   Aortogram showed no flow-limiting stenosis in the aortoiliac segment.  Left lower extremity arteriogram showed a patent common femoral and profunda but the native SFA and common femoral to below-knee prosthetic bypass was occluded.  It did look like he reconstituted a peroneal in mid to distal calf on initial runoff.  Ultimately the bypass was cannulated and crossed with Glidewire advantage across the [facility] stent into the below-knee popliteal artery and into the anterior tibial.  A long UniFuse thrombolysis catheter was placed.  The distal anterior tibial appears occluded.  Thrombolysis will be initiated overnight.   Procedure:  The patient was identified in the holding area and taken to room 8.  The patient was then placed supine on the table and prepped and draped in the usual sterile fashion.  A time out was called.  Ultrasound was used to evaluate the  right common femoral artery.  It was patent .  A digital ultrasound image was acquired.  A micropuncture needle was used to access the right common femoral artery under ultrasound guidance.  An 018 wire was advanced without resistance and a micropuncture sheath was placed.  The 018 wire was removed and a benson wire was placed.  The micropuncture sheath was exchanged for a 5 french sheath.  An omniflush catheter was advanced over the wire to the level of L-1.  An abdominal angiogram was obtained.  Next the aortic bifurcation was crossed with a Omni Flush catheter and Glidewire advantage.  I got my catheter into the distal left external iliac.  Left leg runoff was obtained that showed occlusion of the bypass as noted above.  Ultimately the Glidewire advantage was used to placed a long 6 [place] Ansell sheath in the right groin over the aortic bifurcation into the distal left external iliac.  I then used a BER 2 catheter with a Glidewire advantage to cannulate the bypass and was able to cross all the occluded bypass into the below-knee popliteal artery.  Ultimately I used a V 18 to cross the Tiger stent and placed a long quick cross catheter into the anterior tibial and confirmed I was in the true lumen.  Ultimately a long UniFuse catheter with 50 cm infusion length was placed down the left leg bypass.  Ultimately TPA will infuse at 1 mg an hour and heparin will infuse at 800 units an hour through the sheath.  Taken to ICU for thrombolysis overnight.  Plan: Patient will get thrombolysis overnight and return tomorrow for thrombolytics catheter check.   Marty Heck, MD [facility] [place] Office: [phone]

## 2020-04-29 NOTE — Progress Notes (Signed)
[place] for Heparin Indication: leg ischemia, likely occluded fempop bypass graft  Allergies  Allergen Reactions  . Morphine And Related Rash  . Ramipril Rash and Other (See Comments)    Per patient on R arm and leg only; no angioedema  . Tramadol Itching and Rash    Patient Measurements: Height: 5\' 8"  (172.7 cm) Weight: 79.4 kg (175 lb) IBW/kg (Calculated) : 68.4 Heparin Dosing Weight: 79.4 kg  Vital Signs: Temp: 97.9 F (36.6 C) (11/04 1950) Temp Source: Oral (11/04 1950) BP: 144/84 (11/04 2100) Pulse Rate: 99 (11/04 2200)  Labs: Recent Labs    04/28/20 1550 04/28/20 1550 04/28/20 1630 04/29/20 0414 04/29/20 0757 04/29/20 2159  HGB 11.5*   < >  --   --  11.0* 10.5*  HCT 38.3*  --   --   --  35.1* 33.8*  PLT 254  --   --   --  255 215  APTT  --   --  33  --  37*  --   LABPROT  --   --  13.5  --   --   --   INR  --   --  1.1  --   --   --   HEPARINUNFRC  --   --   --   --  <0.10* <0.10*  CREATININE 1.51*  --   --  1.13  --   --    < > = values in this interval not displayed.    Estimated Creatinine Clearance: 68.1 mL/min (by C-G formula based on SCr of 1.13 mg/dL).  Assessment: 59 y.o. male admitted for leg ischemia with likely occluded fempop bypass graft. Patient reports last taking pta Eliquis 04/28/20 at 1130.   Now on lysis with alteplase at 1 mg/hr Came back to ICU from cath lab on 800 units/hr heparin  11/4 PM update:  Heparin level low No issues per RN  Goal of Therapy:  Hep level 0.2 - 0.5 during lysis   Plan:  Inc heparin to 1000 units/hr Heparin level every 6 hours while on alteplase  Narda Bonds, PharmD, BCPS Clinical Pharmacist Phone: [phone]

## 2020-04-30 ENCOUNTER — Inpatient Hospital Stay (HOSPITAL_COMMUNITY)
Admission: EM | Disposition: A | Discharge status: Home Health | Payer: Self-pay | Source: Home / Self Care | Attending: Vascular Surgery

## 2020-04-30 ENCOUNTER — Encounter (HOSPITAL_COMMUNITY): Payer: Self-pay | Admitting: Vascular Surgery

## 2020-04-30 DIAGNOSIS — T82868A Thrombosis of vascular prosthetic devices, implants and grafts, initial encounter: Secondary | ICD-10-CM

## 2020-04-30 HISTORY — PX: PERIPHERAL VASCULAR INTERVENTION: CATH118257

## 2020-04-30 HISTORY — PX: PERIPHERAL VASCULAR THROMBECTOMY: CATH118306

## 2020-04-30 LAB — LIPID PANEL
Cholesterol: 113 mg/dL (ref 0–200)
HDL: 24 mg/dL — ABNORMAL LOW (ref >40)
LDL Cholesterol: 58 mg/dL (ref 0–99)
Total CHOL/HDL Ratio: 4.7 RATIO
Triglycerides: 154 mg/dL — ABNORMAL HIGH (ref <150)
VLDL: 31 mg/dL (ref 0–40)

## 2020-04-30 LAB — BASIC METABOLIC PANEL
Anion gap: 13 (ref 5–15)
BUN: 17 mg/dL (ref 6–20)
CO2: 23 mmol/L (ref 22–32)
Calcium: 9.1 mg/dL (ref 8.9–10.3)
Chloride: 97 mmol/L — ABNORMAL LOW (ref 98–111)
Creatinine, Ser: 0.96 mg/dL (ref 0.61–1.24)
GFR, Estimated: >60 mL/min (ref >60)
Glucose, Bld: 118 mg/dL — ABNORMAL HIGH (ref 70–99)
Potassium: 3.4 mmol/L — ABNORMAL LOW (ref 3.5–5.1)
Sodium: 133 mmol/L — ABNORMAL LOW (ref 135–145)

## 2020-04-30 LAB — CBC
HCT: 34.6 % — ABNORMAL LOW (ref 39.0–52.0)
HCT: 32.1 % — ABNORMAL LOW (ref 39.0–52.0)
HCT: 28.6 % — ABNORMAL LOW (ref 39.0–52.0)
Hemoglobin: 10.6 g/dL — ABNORMAL LOW (ref 13.0–17.0)
Hemoglobin: 10 g/dL — ABNORMAL LOW (ref 13.0–17.0)
Hemoglobin: 9.2 g/dL — ABNORMAL LOW (ref 13.0–17.0)
MCH: 23.9 pg — ABNORMAL LOW (ref 26.0–34.0)
MCH: 23.8 pg — ABNORMAL LOW (ref 26.0–34.0)
MCH: 24.7 pg — ABNORMAL LOW (ref 26.0–34.0)
MCHC: 30.6 g/dL (ref 30.0–36.0)
MCHC: 31.2 g/dL (ref 30.0–36.0)
MCHC: 32.2 g/dL (ref 30.0–36.0)
MCV: 78.1 fL — ABNORMAL LOW (ref 80.0–100.0)
MCV: 76.4 fL — ABNORMAL LOW (ref 80.0–100.0)
MCV: 76.7 fL — ABNORMAL LOW (ref 80.0–100.0)
Platelets: 204 10*3/uL (ref 150–400)
Platelets: 180 10*3/uL (ref 150–400)
Platelets: 181 10*3/uL (ref 150–400)
RBC: 4.43 MIL/uL (ref 4.22–5.81)
RBC: 4.2 MIL/uL — ABNORMAL LOW (ref 4.22–5.81)
RBC: 3.73 MIL/uL — ABNORMAL LOW (ref 4.22–5.81)
RDW: 15.9 % — ABNORMAL HIGH (ref 11.5–15.5)
RDW: 15.9 % — ABNORMAL HIGH (ref 11.5–15.5)
RDW: 15.8 % — ABNORMAL HIGH (ref 11.5–15.5)
WBC: 8.9 10*3/uL (ref 4.0–10.5)
WBC: 6.1 10*3/uL (ref 4.0–10.5)
WBC: 6.4 10*3/uL (ref 4.0–10.5)
nRBC: 0 % (ref 0.0–0.2)
nRBC: 0 % (ref 0.0–0.2)
nRBC: 0 % (ref 0.0–0.2)

## 2020-04-30 LAB — POCT ACTIVATED CLOTTING TIME: Activated Clotting Time: 180 seconds

## 2020-04-30 LAB — HEPARIN LEVEL (UNFRACTIONATED)
Heparin Unfractionated: <0.1 IU/mL — ABNORMAL LOW (ref 0.30–0.70)
Heparin Unfractionated: 0.11 IU/mL — ABNORMAL LOW (ref 0.30–0.70)

## 2020-04-30 LAB — FIBRINOGEN
Fibrinogen: 124 mg/dL — ABNORMAL LOW (ref 210–475)
Fibrinogen: 223 mg/dL (ref 210–475)

## 2020-04-30 SURGERY — PERIPHERAL VASCULAR THROMBECTOMY
Anesthesia: LOCAL

## 2020-04-30 MED ORDER — CLOPIDOGREL BISULFATE 75 MG PO TABS
75.0000 mg | ORAL_TABLET | Freq: Every day | ORAL | Status: DC
  Administered 2020-05-02 – 2020-05-04 (×3): 75 mg via ORAL
  Filled 2020-04-30 (×4): qty 1

## 2020-04-30 MED ORDER — NITROGLYCERIN 1 MG/10 ML FOR IR/CATH LAB
INTRA_ARTERIAL | Status: AC
  Filled 2020-04-30: qty 10

## 2020-04-30 MED ORDER — NITROGLYCERIN 1 MG/10 ML FOR IR/CATH LAB
INTRA_ARTERIAL | Status: DC | PRN
  Administered 2020-04-30: 200 ug via INTRA_ARTERIAL

## 2020-04-30 MED ORDER — HEPARIN SODIUM (PORCINE) 1000 UNIT/ML IJ SOLN
INTRAMUSCULAR | Status: AC
  Filled 2020-04-30: qty 1

## 2020-04-30 MED ORDER — CLOPIDOGREL BISULFATE 300 MG PO TABS
ORAL_TABLET | ORAL | Status: AC
  Filled 2020-04-30: qty 1

## 2020-04-30 MED ORDER — MIDAZOLAM HCL 2 MG/2ML IJ SOLN
INTRAMUSCULAR | Status: AC
  Filled 2020-04-30: qty 2

## 2020-04-30 MED ORDER — OXYCODONE-ACETAMINOPHEN 5-325 MG PO TABS
ORAL_TABLET | ORAL | Status: AC
  Filled 2020-04-30: qty 2

## 2020-04-30 MED ORDER — LIDOCAINE HCL (PF) 1 % IJ SOLN
INTRAMUSCULAR | Status: AC
  Filled 2020-04-30: qty 30

## 2020-04-30 MED ORDER — CLOPIDOGREL BISULFATE 300 MG PO TABS
300.0000 mg | ORAL_TABLET | Freq: Once | ORAL | Status: AC
  Administered 2020-04-30: 300 mg via ORAL

## 2020-04-30 MED ORDER — HEPARIN (PORCINE) IN NACL 1000-0.9 UT/500ML-% IV SOLN
INTRAVENOUS | Status: AC
  Filled 2020-04-30: qty 500

## 2020-04-30 MED ORDER — FENTANYL CITRATE (PF) 100 MCG/2ML IJ SOLN
INTRAMUSCULAR | Status: AC
  Filled 2020-04-30: qty 2

## 2020-04-30 MED ORDER — MIDAZOLAM HCL 2 MG/2ML IJ SOLN
INTRAMUSCULAR | Status: DC | PRN
  Administered 2020-04-30: 2 mg via INTRAVENOUS

## 2020-04-30 MED ORDER — FENTANYL CITRATE (PF) 100 MCG/2ML IJ SOLN
INTRAMUSCULAR | Status: DC | PRN
  Administered 2020-04-30 (×3): 50 ug via INTRAVENOUS

## 2020-04-30 MED ORDER — HEPARIN SODIUM (PORCINE) 1000 UNIT/ML IJ SOLN
INTRAMUSCULAR | Status: DC | PRN
  Administered 2020-04-30 (×2): 5000 [IU] via INTRAVENOUS

## 2020-04-30 MED ORDER — HEPARIN (PORCINE) IN NACL 1000-0.9 UT/500ML-% IV SOLN
INTRAVENOUS | Status: DC | PRN
  Administered 2020-04-30 (×2): 500 mL

## 2020-04-30 MED ORDER — HEPARIN (PORCINE) 25000 UT/250ML-% IV SOLN
1650.0000 [IU]/h | INTRAVENOUS | Status: DC
  Administered 2020-05-01: 1500 [IU]/h via INTRAVENOUS
  Filled 2020-04-30 (×2): qty 250

## 2020-04-30 MED FILL — Heparin Sod (Porcine)-NaCl IV Soln 1000 Unit/500ML-0.9%: INTRAVENOUS | Qty: 1000 | Status: AC

## 2020-04-30 SURGICAL SUPPLY — 15 items
BALLN STERLING OTW 3X60X150 (BALLOONS) ×3
BALLN STERLING OTW 4X220X150 (BALLOONS) ×3
BALLOON STERLING OTW 3X60X150 (BALLOONS) IMPLANT
BALLOON STERLING OTW 4X220X150 (BALLOONS) IMPLANT
CANISTER PENUMBRA ENGINE (MISCELLANEOUS) ×1 IMPLANT
CATH INDIGO CAT6 KIT (CATHETERS) ×1 IMPLANT
CATH QUICKCROSS .035X135CM (MICROCATHETER) ×1 IMPLANT
DEVICE CLOSURE MYNXGRIP 6/7F (Vascular Products) ×1 IMPLANT
KIT ENCORE 26 ADVANTAGE (KITS) ×1 IMPLANT
KIT PV (KITS) ×1 IMPLANT
SHEATH PINNACLE 6F 10CM (SHEATH) ×1 IMPLANT
SHEATH PINNACLE MP 6F 45CM (SHEATH) ×1 IMPLANT
TRAY PV CATH (CUSTOM PROCEDURE TRAY) ×1 IMPLANT
WIRE G V18X300CM (WIRE) ×1 IMPLANT
WIRE HI TORQ VERSACORE J 260CM (WIRE) ×1 IMPLANT

## 2020-04-30 NOTE — Progress Notes (Signed)
[place] for Heparin Indication: leg ischemia, likely occluded fempop bypass graft  Allergies  Allergen Reactions  . Morphine And Related Rash  . Ramipril Rash and Other (See Comments)    Per patient on R arm and leg only; no angioedema  . Tramadol Itching and Rash    Patient Measurements: Height: 5\' 8"  (172.7 cm) Weight: 79.4 kg (175 lb) IBW/kg (Calculated) : 68.4 Heparin Dosing Weight: 79.4 kg  Vital Signs: Temp: 98.8 F (37.1 C) (11/05 0337) Temp Source: Oral (11/05 0337) BP: 136/77 (11/05 0300) Pulse Rate: 97 (11/05 0300)  Labs: Recent Labs    04/28/20 1550 04/28/20 1550 04/28/20 1630 04/29/20 0414 04/29/20 0757 04/29/20 0757 04/29/20 2159 04/30/20 0447  HGB 11.5*   < >  --   --  11.0*   < > 10.5* 10.6*  HCT 38.3*   < >  --   --  35.1*  --  33.8* 34.6*  PLT 254   < >  --   --  255  --  215 204  APTT  --   --  33  --  37*  --   --   --   LABPROT  --   --  13.5  --   --   --   --   --   INR  --   --  1.1  --   --   --   --   --   HEPARINUNFRC  --   --   --   --  <0.10*  --  <0.10* <0.10*  CREATININE 1.51*  --   --  1.13  --   --   --  0.96   < > = values in this interval not displayed.    Estimated Creatinine Clearance: 80.2 mL/min (by C-G formula based on SCr of 0.96 mg/dL).  Assessment: 59 y.o. male admitted for leg ischemia with likely occluded fempop bypass graft. Patient reports last taking pta Eliquis 04/28/20 at 1130.   Now on lysis with alteplase at 1 mg/hr Came back to ICU from cath lab on 800 units/hr heparin  11/5 AM update:  Heparin level low No issues per RN  Goal of Therapy:  Hep level 0.2 - 0.5 during lysis   Plan:  Inc heparin to 1200 units/hr Heparin level every 6 hours while on alteplase  Narda Bonds, PharmD, BCPS Clinical Pharmacist Phone: [phone]

## 2020-04-30 NOTE — Progress Notes (Signed)
[place] for Heparin Indication: leg ischemia, likely occluded fempop bypass graft  Allergies  Allergen Reactions  . Morphine And Related Rash  . Ramipril Rash and Other (See Comments)    Per patient on R arm and leg only; no angioedema  . Tramadol Itching and Rash    Patient Measurements: Height: 5\' 8"  (172.7 cm) Weight: 79.4 kg (175 lb) IBW/kg (Calculated) : 68.4 Heparin Dosing Weight: 79.4 kg  Vital Signs: Temp: 98.8 F (37.1 C) (11/05 0337) Temp Source: Oral (11/05 0337) BP: 130/84 (11/05 0913) Pulse Rate: 104 (11/05 0913)  Labs: Recent Labs    04/28/20 1550 04/28/20 1550 04/28/20 1630 04/29/20 0414 04/29/20 0757 04/29/20 0757 04/29/20 2159 04/30/20 0447  HGB 11.5*   < >  --   --  11.0*   < > 10.5* 10.6*  HCT 38.3*   < >  --   --  35.1*  --  33.8* 34.6*  PLT 254   < >  --   --  255  --  215 204  APTT  --   --  33  --  37*  --   --   --   LABPROT  --   --  13.5  --   --   --   --   --   INR  --   --  1.1  --   --   --   --   --   HEPARINUNFRC  --   --   --   --  <0.10*  --  <0.10* <0.10*  CREATININE 1.51*  --   --  1.13  --   --   --  0.96   < > = values in this interval not displayed.    Estimated Creatinine Clearance: 80.2 mL/min (by C-G formula based on SCr of 0.96 mg/dL).  Assessment: 21 yoM on apiaxban PTA admitted with L leg ischemia. Pt now s/p lysis therapy with thrombectomy in PV lab 11/5. Pharmacy asked to begin IV heparin, will adjust goal range.  Goal of Therapy:  Heparin level 0.3-0.7 units/ml Monitor platelets by anticoagulation protocol: Yes  Plan:  Heparin 1200 units/h Check 6hr heparin level  Arrie Senate, PharmD, BCPS Clinical Pharmacist [phone] Please check AMION for all [place] numbers 04/30/2020

## 2020-04-30 NOTE — Progress Notes (Signed)
[place] for Heparin Indication: leg ischemia, likely occluded fempop bypass graft  Allergies  Allergen Reactions  . Morphine And Related Rash  . Ramipril Rash and Other (See Comments)    Per patient on R arm and leg only; no angioedema  . Tramadol Itching and Rash    Patient Measurements: Height: 5\' 8"  (172.7 cm) Weight: 79.4 kg (175 lb) IBW/kg (Calculated) : 68.4 Heparin Dosing Weight: 79.4 kg  Vital Signs: Temp: 98.3 F (36.8 C) (11/05 2312) Temp Source: Oral (11/05 2312) BP: 112/71 (11/05 2312) Pulse Rate: 81 (11/05 2312)  Labs: Recent Labs    04/28/20 1550 04/28/20 1550 04/28/20 1630 04/29/20 0414 04/29/20 0757 04/29/20 0757 04/29/20 2159 04/29/20 2159 04/30/20 0447 04/30/20 0447 04/30/20 1548 04/30/20 2257  HGB 11.5*   < >  --   --  11.0*   < > 10.5*   < > 10.6*   < > 10.0* 9.2*  HCT 38.3*   < >  --   --  35.1*   < > 33.8*   < > 34.6*  --  32.1* 28.6*  PLT 254   < >  --   --  255   < > 215   < > 204  --  180 181  APTT  --   --  33  --  37*  --   --   --   --   --   --   --   LABPROT  --   --  13.5  --   --   --   --   --   --   --   --   --   INR  --   --  1.1  --   --   --   --   --   --   --   --   --   HEPARINUNFRC  --   --   --   --  <0.10*   < > <0.10*  --  <0.10*  --   --  0.11*  CREATININE 1.51*  --   --  1.13  --   --   --   --  0.96  --   --   --    < > = values in this interval not displayed.    Estimated Creatinine Clearance: 80.2 mL/min (by C-G formula based on SCr of 0.96 mg/dL).  Assessment: 59 yo Male with thrombosed bypass graft s/p thrombectomy/thrombolysis for heparin Goal of Therapy:  Heparin level 0.3-0.7 units/ml Monitor platelets by anticoagulation protocol: Yes  Plan:  Increase Heparin  1500 units/hr Check heparin level in 8 hours.   Phillis Knack, PharmD, BCPS  04/30/2020

## 2020-04-30 NOTE — Op Note (Addendum)
DATE OF SERVICE: 04/30/2020  PATIENT:  [name] [name]  59 y.o. male  PRE-OPERATIVE DIAGNOSIS:  Occluded bypass  POST-OPERATIVE DIAGNOSIS:  * No post-op diagnosis entered *  PROCEDURE:   1. Left lower extremity angiogram (50mL contrast) 2. Suction thrombectomy (CAT6 Penumbra) left femoral popliteal bypass 3. Angioplasty popliteal artery (3x6mm Sterling) 4. Angioplasty anterior tibial artery (3x77mm Sterling) 5. Conscious sedation (70 minutes)  SURGEON:  Surgeon(s) and Role:    * Porcha Deblanc, Yevonne Aline, MD - Primary  ASSISTANT: none  ANESTHESIA:   local and IV sedation  EBL:  517mL in penumbra canister  BLOOD ADMINISTERED:none  DRAINS: none   LOCAL MEDICATIONS USED:  LIDOCAINE   SPECIMEN:  none  DISPOSITION OF SPECIMEN:  n/a  COUNTS: confirmed correct.  TOURNIQUET:  * No tourniquets in log *  PLAN OF CARE: Admit to inpatient   PATIENT DISPOSITION:  PACU - hemodynamically stable.   Delay start of Pharmacological VTE agent (>24hrs) due to surgical blood loss or risk of bleeding: no  INDICATION FOR PROCEDURE: [name] [name] is a 59 y.o. male with thrombosed left femoral-popliteal bypass graft and ischemic rest pain. He has undergone about 18 hours of thrombolysis. After careful discussion of risks, benefits, and alternatives the patient was offered repeat angiography. The patient understood and wished to proceed.  DESCRIPTION OF PROCEDURE: After identification of the patient in the pre-operative holding area, the patient was transferred to the operating room. The patient was positioned supine on the operating room table. Anesthesia was induced. The groins were prepped and draped in standard fashion. A surgical pause was performed confirming correct patient, procedure, and operative location.  An 035 versa core wire was introduced into the thrombolysis catheter and advanced into the popliteal artery.  Catheter was withdrawn.  Angiography was performed.  The bypass graft.  Occluded  still.  Over the wire a CAT 6 penumbra device was introduced.  Suction thrombectomy was performed throughout the length of the femoral-popliteal bypass.  Multiple passes were done with ultimate clearance of the bypass graft or thrombus.  Angiography was performed of the vessels below the bypass.  There appeared to be some stenosis or residual thrombus in the below-knee popliteal artery extending into the anterior tibial artery.  I elected to angioplasty this with a 3 x 60 mm Sterling balloon x2.  Nitroglycerin was administered.  Final technical result was good.  There is mild stenosis in the proximal third of the anterior tibial artery which was nonflow limiting.  I elected not to treat this.  Upon completion angiography there is excellent filling through the AT and to the peroneal artery via calf collaterals.  There is a strongly biphasic Doppler signal at the completion of the case.  The right common femoral arteriotomy was closed with the 6 French minx device in standard fashion.  The closure was found to be hemostatic.  Upon completion of the case instrument and sharps counts were confirmed correct. The patient was transferred to the PACU in good condition. I was present for all portions of the procedure.  Yevonne Aline. Stanford Breed, MD [facility] Phone Number: [phone] 04/30/2020 9:11 AM

## 2020-04-30 NOTE — Progress Notes (Signed)
   ASSESSMENT & PLAN:  [name] [name] is a 59 y.o. male with thrombosed L femoral-popliteal bypass with critical limb ischemia. Status post 1 day of thrombolysis. For catheter check and intervention today.  SUBJECTIVE:  No change in foot overnight.   OBJECTIVE:  BP (!) 147/94   Pulse (!) 103   Temp 98.8 F (37.1 C) (Oral)   Resp 13   Ht 5\' 8"  (1.727 m)   Wt 79.4 kg   SpO2 96%   BMI 26.61 kg/m   Intake/Output Summary (Last 24 hours) at 04/30/2020 0909 Last data filed at 04/30/2020 0700 Gross per 24 hour  Intake 468.79 ml  Output 1350 ml  Net -881.21 ml    Urine output over past 24 hours: 1L  Constitutional: well appearing. no acute distress. Cardiac: RRR. Vascular: ischemic left foot. No palpable pulses.  CBC Latest Ref Rng & Units 04/30/2020 04/29/2020 04/29/2020  WBC 4.0 - 10.5 K/uL 8.9 9.7 7.1  Hemoglobin 13.0 - 17.0 g/dL 10.6(L) 10.5(L) 11.0(L)  Hematocrit 39 - 52 % 34.6(L) 33.8(L) 35.1(L)  Platelets 150 - 400 K/uL 204 215 255     CMP Latest Ref Rng & Units 04/30/2020 04/29/2020 04/28/2020  Glucose 70 - 99 mg/dL 118(H) 133(H) 110(H)  BUN 6 - 20 mg/dL 17 20 26(H)  Creatinine 0.61 - 1.24 mg/dL 0.96 1.13 1.51(H)  Sodium 135 - 145 mmol/L 133(L) 134(L) 136  Potassium 3.5 - 5.1 mmol/L 3.4(L) 3.3(L) 3.4(L)  Chloride 98 - 111 mmol/L 97(L) 98 96(L)  CO2 22 - 32 mmol/L 23 25 27   Calcium 8.9 - 10.3 mg/dL 9.1 9.0 9.2  Total Protein 6.5 - 8.1 g/dL - - -  Total Bilirubin 0.3 - 1.2 mg/dL - - -  Alkaline Phos 38 - 126 U/L - - -  AST 15 - 41 U/L - - -  ALT 0 - 44 U/L - - -    Estimated Creatinine Clearance: 80.2 mL/min (by C-G formula based on SCr of 0.96 mg/dL).   Yevonne Aline. Stanford Breed, MD [facility] Phone Number: [phone] 04/30/2020 9:09 AM

## 2020-05-01 ENCOUNTER — Inpatient Hospital Stay (HOSPITAL_COMMUNITY): Payer: Medicare Other | Admitting: Certified Registered Nurse Anesthetist

## 2020-05-01 ENCOUNTER — Encounter (HOSPITAL_COMMUNITY)
Admission: EM | Disposition: A | Discharge status: Home Health | Payer: Self-pay | Source: Home / Self Care | Attending: Vascular Surgery

## 2020-05-01 DIAGNOSIS — T82868A Thrombosis of vascular prosthetic devices, implants and grafts, initial encounter: Secondary | ICD-10-CM

## 2020-05-01 HISTORY — PX: FASCIOTOMY: SHX132

## 2020-05-01 HISTORY — PX: PATCH ANGIOPLASTY: SHX6230

## 2020-05-01 HISTORY — PX: THROMBECTOMY OF BYPASS GRAFT FEMORAL- POPLITEAL ARTERY: SHX6902

## 2020-05-01 HISTORY — PX: ENDARTERECTOMY POPLITEAL: SHX5806

## 2020-05-01 LAB — CBC
HCT: 29.4 % — ABNORMAL LOW (ref 39.0–52.0)
Hemoglobin: 9.1 g/dL — ABNORMAL LOW (ref 13.0–17.0)
MCH: 23.6 pg — ABNORMAL LOW (ref 26.0–34.0)
MCHC: 31 g/dL (ref 30.0–36.0)
MCV: 76.2 fL — ABNORMAL LOW (ref 80.0–100.0)
Platelets: 181 10*3/uL (ref 150–400)
RBC: 3.86 MIL/uL — ABNORMAL LOW (ref 4.22–5.81)
RDW: 15.7 % — ABNORMAL HIGH (ref 11.5–15.5)
WBC: 6.7 10*3/uL (ref 4.0–10.5)
nRBC: 0 % (ref 0.0–0.2)

## 2020-05-01 LAB — PREPARE RBC (CROSSMATCH)

## 2020-05-01 LAB — HEPARIN LEVEL (UNFRACTIONATED): Heparin Unfractionated: 0.16 IU/mL — ABNORMAL LOW (ref 0.30–0.70)

## 2020-05-01 SURGERY — THROMBECTOMY OF BYPASS GRAFT FEMORAL-POPLITEAL ARTERY
Anesthesia: General | Site: Leg Lower | Laterality: Left

## 2020-05-01 MED ORDER — SODIUM CHLORIDE 0.9 % WEIGHT BASED INFUSION
1.0000 mL/kg/h | INTRAVENOUS | Status: AC
  Administered 2020-05-01: 1 mL/kg/h via INTRAVENOUS

## 2020-05-01 MED ORDER — MIDAZOLAM HCL 5 MG/5ML IJ SOLN
INTRAMUSCULAR | Status: DC | PRN
  Administered 2020-05-01 (×2): 1 mg via INTRAVENOUS

## 2020-05-01 MED ORDER — HEMOSTATIC AGENTS (NO CHARGE) OPTIME
TOPICAL | Status: DC | PRN
  Administered 2020-05-01: 1 via TOPICAL

## 2020-05-01 MED ORDER — SODIUM CHLORIDE 0.9% FLUSH: 3.0000 mL | INTRAVENOUS | Status: DC | PRN

## 2020-05-01 MED ORDER — LACTATED RINGERS IV SOLN: INTRAVENOUS | Status: DC

## 2020-05-01 MED ORDER — PHENYLEPHRINE HCL-NACL 10-0.9 MG/250ML-% IV SOLN
INTRAVENOUS | Status: DC | PRN
  Administered 2020-05-01 (×2): 20 ug/min via INTRAVENOUS

## 2020-05-01 MED ORDER — ACETAMINOPHEN 325 MG PO TABS
650.0000 mg | ORAL_TABLET | ORAL | Status: DC | PRN
  Administered 2020-05-02 – 2020-05-04 (×3): 650 mg via ORAL
  Filled 2020-05-01 (×3): qty 2

## 2020-05-01 MED ORDER — FENTANYL CITRATE (PF) 250 MCG/5ML IJ SOLN
INTRAMUSCULAR | Status: AC
  Filled 2020-05-01: qty 5

## 2020-05-01 MED ORDER — ONDANSETRON HCL 4 MG/2ML IJ SOLN
INTRAMUSCULAR | Status: DC | PRN
  Administered 2020-05-01: 4 mg via INTRAVENOUS

## 2020-05-01 MED ORDER — ACETAMINOPHEN 10 MG/ML IV SOLN: 1000.0000 mg | Freq: Once | INTRAVENOUS | Status: DC | PRN

## 2020-05-01 MED ORDER — ROCURONIUM BROMIDE 10 MG/ML (PF) SYRINGE
PREFILLED_SYRINGE | INTRAVENOUS | Status: AC
  Filled 2020-05-01: qty 10

## 2020-05-01 MED ORDER — ONDANSETRON HCL 4 MG/2ML IJ SOLN: 4.0000 mg | Freq: Four times a day (QID) | INTRAMUSCULAR | Status: DC | PRN

## 2020-05-01 MED ORDER — HEPARIN SODIUM (PORCINE) 1000 UNIT/ML IJ SOLN
INTRAMUSCULAR | Status: DC | PRN
  Administered 2020-05-01: 5000 [IU] via INTRAVENOUS
  Administered 2020-05-01: 3000 [IU] via INTRAVENOUS

## 2020-05-01 MED ORDER — PROPOFOL 10 MG/ML IV BOLUS
INTRAVENOUS | Status: DC | PRN
  Administered 2020-05-01: 150 mg via INTRAVENOUS

## 2020-05-01 MED ORDER — CHLORHEXIDINE GLUCONATE 0.12 % MT SOLN
OROMUCOSAL | Status: AC
  Administered 2020-05-01: 15 mL via OROMUCOSAL
  Filled 2020-05-01: qty 15

## 2020-05-01 MED ORDER — SUGAMMADEX SODIUM 200 MG/2ML IV SOLN
INTRAVENOUS | Status: DC | PRN
  Administered 2020-05-01: 200 mg via INTRAVENOUS

## 2020-05-01 MED ORDER — MIDAZOLAM HCL 2 MG/2ML IJ SOLN
INTRAMUSCULAR | Status: AC
  Filled 2020-05-01: qty 2

## 2020-05-01 MED ORDER — HEPARIN (PORCINE) 25000 UT/250ML-% IV SOLN
1850.0000 [IU]/h | INTRAVENOUS | Status: AC
  Administered 2020-05-01: 500 [IU]/h via INTRAVENOUS
  Administered 2020-05-02: 1750 [IU]/h via INTRAVENOUS
  Filled 2020-05-01 (×2): qty 250

## 2020-05-01 MED ORDER — PROTAMINE SULFATE 10 MG/ML IV SOLN
INTRAVENOUS | Status: AC
  Filled 2020-05-01: qty 5

## 2020-05-01 MED ORDER — CEFAZOLIN SODIUM-DEXTROSE 2-4 GM/100ML-% IV SOLN
INTRAVENOUS | Status: AC
  Filled 2020-05-01: qty 100

## 2020-05-01 MED ORDER — ROCURONIUM BROMIDE 10 MG/ML (PF) SYRINGE
PREFILLED_SYRINGE | INTRAVENOUS | Status: DC | PRN
  Administered 2020-05-01: 60 mg via INTRAVENOUS

## 2020-05-01 MED ORDER — PROMETHAZINE HCL 25 MG/ML IJ SOLN: 6.2500 mg | INTRAMUSCULAR | Status: DC | PRN

## 2020-05-01 MED ORDER — FENTANYL CITRATE (PF) 250 MCG/5ML IJ SOLN
INTRAMUSCULAR | Status: DC | PRN
  Administered 2020-05-01: 50 ug via INTRAVENOUS
  Administered 2020-05-01: 150 ug via INTRAVENOUS
  Administered 2020-05-01 (×2): 50 ug via INTRAVENOUS

## 2020-05-01 MED ORDER — FENTANYL CITRATE (PF) 100 MCG/2ML IJ SOLN
25.0000 ug | INTRAMUSCULAR | Status: DC | PRN
  Administered 2020-05-01: 25 ug via INTRAVENOUS
  Administered 2020-05-01: 50 ug via INTRAVENOUS
  Administered 2020-05-01: 25 ug via INTRAVENOUS

## 2020-05-01 MED ORDER — LABETALOL HCL 5 MG/ML IV SOLN: 10.0000 mg | INTRAVENOUS | Status: DC | PRN

## 2020-05-01 MED ORDER — CHLORHEXIDINE GLUCONATE 0.12 % MT SOLN: 15.0000 mL | Freq: Once | OROMUCOSAL | Status: AC

## 2020-05-01 MED ORDER — CEFAZOLIN SODIUM-DEXTROSE 1-4 GM/50ML-% IV SOLN
1.0000 g | Freq: Three times a day (TID) | INTRAVENOUS | Status: DC
  Administered 2020-05-01: 2 g via INTRAVENOUS
  Filled 2020-05-01 (×2): qty 50

## 2020-05-01 MED ORDER — SODIUM CHLORIDE 0.9 % IV SOLN: 250.0000 mL | INTRAVENOUS | Status: DC | PRN

## 2020-05-01 MED ORDER — DEXAMETHASONE SODIUM PHOSPHATE 10 MG/ML IJ SOLN
INTRAMUSCULAR | Status: DC | PRN
  Administered 2020-05-01: 4 mg via INTRAVENOUS

## 2020-05-01 MED ORDER — ALBUMIN HUMAN 5 % IV SOLN: INTRAVENOUS | Status: DC | PRN

## 2020-05-01 MED ORDER — LIDOCAINE 2% (20 MG/ML) 5 ML SYRINGE
INTRAMUSCULAR | Status: AC
  Filled 2020-05-01: qty 5

## 2020-05-01 MED ORDER — SODIUM CHLORIDE 0.9% FLUSH
3.0000 mL | Freq: Two times a day (BID) | INTRAVENOUS | Status: DC
  Administered 2020-05-02 – 2020-05-04 (×4): 3 mL via INTRAVENOUS

## 2020-05-01 MED ORDER — 0.9 % SODIUM CHLORIDE (POUR BTL) OPTIME
TOPICAL | Status: DC | PRN
  Administered 2020-05-01: 2000 mL

## 2020-05-01 MED ORDER — PROTAMINE SULFATE 10 MG/ML IV SOLN
INTRAVENOUS | Status: DC | PRN
  Administered 2020-05-01: 50 mg via INTRAVENOUS

## 2020-05-01 MED ORDER — PHENYLEPHRINE 40 MCG/ML (10ML) SYRINGE FOR IV PUSH (FOR BLOOD PRESSURE SUPPORT)
PREFILLED_SYRINGE | INTRAVENOUS | Status: DC | PRN
  Administered 2020-05-01 (×2): 120 ug via INTRAVENOUS

## 2020-05-01 MED ORDER — PROPOFOL 10 MG/ML IV BOLUS
INTRAVENOUS | Status: AC
  Filled 2020-05-01: qty 20

## 2020-05-01 MED ORDER — HYDRALAZINE HCL 20 MG/ML IJ SOLN: 5.0000 mg | INTRAMUSCULAR | Status: DC | PRN

## 2020-05-01 MED ORDER — FENTANYL CITRATE (PF) 100 MCG/2ML IJ SOLN
INTRAMUSCULAR | Status: AC
  Filled 2020-05-01: qty 2

## 2020-05-01 MED ORDER — SODIUM CHLORIDE 0.9 % IV SOLN
INTRAVENOUS | Status: DC | PRN
  Administered 2020-05-01: 12:00:00 500 mL

## 2020-05-01 MED ORDER — ONDANSETRON HCL 4 MG/2ML IJ SOLN
INTRAMUSCULAR | Status: AC
  Filled 2020-05-01: qty 2

## 2020-05-01 MED ORDER — DEXAMETHASONE SODIUM PHOSPHATE 10 MG/ML IJ SOLN
INTRAMUSCULAR | Status: AC
  Filled 2020-05-01: qty 1

## 2020-05-01 MED ORDER — LIDOCAINE 2% (20 MG/ML) 5 ML SYRINGE
INTRAMUSCULAR | Status: DC | PRN
  Administered 2020-05-01: 60 mg via INTRAVENOUS

## 2020-05-01 SURGICAL SUPPLY — 38 items
CATH EMB 3FR 40CM (CATHETERS) ×4 IMPLANT
CATH EMB 4FR 80CM (CATHETERS) ×6 IMPLANT
CLIP VESOCCLUDE MED 6/CT (CLIP) ×2 IMPLANT
CLIP VESOCCLUDE SM WIDE 6/CT (CLIP) ×2 IMPLANT
DRAPE HALF SHEET 40X57 (DRAPES) ×2 IMPLANT
DRSG COVADERM 4X10 (GAUZE/BANDAGES/DRESSINGS) ×2 IMPLANT
DRSG COVADERM 4X8 (GAUZE/BANDAGES/DRESSINGS) ×2 IMPLANT
ELECT REM PT RETURN 9FT ADLT (ELECTROSURGICAL) ×3
ELECTRODE REM PT RTRN 9FT ADLT (ELECTROSURGICAL) IMPLANT
GLOVE BIO SURGEON STRL SZ 6.5 (GLOVE) ×1 IMPLANT
GLOVE BIO SURGEON STRL SZ7.5 (GLOVE) ×2 IMPLANT
GLOVE BIO SURGEONS STRL SZ 6.5 (GLOVE) ×1
GLOVE BIOGEL PI IND STRL 7.5 (GLOVE) IMPLANT
GLOVE BIOGEL PI INDICATOR 7.5 (GLOVE) ×2
GLOVE SURG SS PI 7.5 STRL IVOR (GLOVE) ×2 IMPLANT
GOWN STRL REUS W/ TWL XL LVL3 (GOWN DISPOSABLE) IMPLANT
GOWN STRL REUS W/TWL LRG LVL3 (GOWN DISPOSABLE) ×4 IMPLANT
GOWN STRL REUS W/TWL XL LVL3 (GOWN DISPOSABLE) ×3
HEMOSTAT SNOW SURGICEL 2X4 (HEMOSTASIS) ×2 IMPLANT
KIT BASIN OR (CUSTOM PROCEDURE TRAY) ×2 IMPLANT
NS IRRIG 1000ML POUR BTL (IV SOLUTION) ×4 IMPLANT
PACK PERIPHERAL VASCULAR (CUSTOM PROCEDURE TRAY) ×2 IMPLANT
PATCH HEMASHIELD 8X150 (Vascular Products) ×2 IMPLANT
STAPLER VISISTAT (STAPLE) ×2 IMPLANT
STAPLER VISISTAT 35W (STAPLE) ×2 IMPLANT
SUT PROLENE 5 0 C 1 24 (SUTURE) ×10 IMPLANT
SUT PROLENE 6 0 C 1 24 (SUTURE) ×2 IMPLANT
SUT PROLENE 6 0 CC (SUTURE) ×12 IMPLANT
SUT SILK 3 0 (SUTURE) ×3
SUT SILK 3-0 18XBRD TIE 12 (SUTURE) IMPLANT
SUT VIC AB 2-0 CT1 27 (SUTURE) ×3
SUT VIC AB 2-0 CT1 TAPERPNT 27 (SUTURE) IMPLANT
SUT VIC AB 3-0 SH 27 (SUTURE) ×3
SUT VIC AB 3-0 SH 27X BRD (SUTURE) IMPLANT
TOWEL GREEN STERILE (TOWEL DISPOSABLE) ×2 IMPLANT
TOWEL GREEN STERILE FF (TOWEL DISPOSABLE) ×2 IMPLANT
TRAY FOLEY SLVR 16FR LF STAT (SET/KITS/TRAYS/PACK) ×2 IMPLANT
WATER STERILE IRR 1000ML POUR (IV SOLUTION) ×2 IMPLANT

## 2020-05-01 NOTE — Anesthesia Preprocedure Evaluation (Addendum)
Anesthesia Evaluation  Patient identified by MRN, date of birth, ID band Patient awake    Reviewed: Allergy & Precautions, NPO status , Patient's Chart, lab work & pertinent test results  Airway Mallampati: III  TM Distance: >3 FB Neck ROM: Full    Dental  (+) Edentulous Upper, Edentulous Lower   Pulmonary Current Smoker and Patient abstained from smoking.,    Pulmonary exam normal breath sounds clear to auscultation       Cardiovascular hypertension, Pt. on medications + CAD and + Peripheral Vascular Disease  Normal cardiovascular exam Rhythm:Regular Rate:Normal  ECG: SR, rate 93   Neuro/Psych negative neurological ROS  negative psych ROS   GI/Hepatic Neg liver ROS, GERD  Controlled,  Endo/Other  negative endocrine ROS  Renal/GU negative Renal ROS     Musculoskeletal  (+) Arthritis , Rheumatoid disorders,    Abdominal   Peds  Hematology  (+) anemia , HLD   Anesthesia Other Findings LEFT LEG PAIN  Reproductive/Obstetrics                           Anesthesia Physical Anesthesia Plan  ASA: III and emergent  Anesthesia Plan: General   Post-op Pain Management:    Induction: Intravenous  PONV Risk Score and Plan: 1 and Ondansetron, Dexamethasone, Midazolam and Treatment may vary due to age or medical condition  Airway Management Planned: Oral ETT  Additional Equipment:   Intra-op Plan:   Post-operative Plan: Extubation in OR  Informed Consent: I have reviewed the patients History and Physical, chart, labs and discussed the procedure including the risks, benefits and alternatives for the proposed anesthesia with the patient or authorized representative who has indicated his/her understanding and acceptance.       Plan Discussed with: CRNA  Anesthesia Plan Comments:        Anesthesia Quick Evaluation

## 2020-05-01 NOTE — Anesthesia Procedure Notes (Signed)
Procedure Name: Intubation Performed by: Milford Cage, CRNA Pre-anesthesia Checklist: Patient identified, Emergency Drugs available, Suction available and Patient being monitored Patient Re-evaluated:Patient Re-evaluated prior to induction Oxygen Delivery Method: Circle System Utilized Preoxygenation: Pre-oxygenation with 100% oxygen Induction Type: IV induction Ventilation: Mask ventilation with difficulty and Two handed mask ventilation required Laryngoscope Size: Miller and 2 Grade View: Grade II Tube type: Oral Tube size: 7.5 mm Number of attempts: 1 Airway Equipment and Method: Stylet and Oral airway Placement Confirmation: ETT inserted through vocal cords under direct vision,  positive ETCO2 and breath sounds checked- equal and bilateral Secured at: 23 cm Tube secured with: Tape Dental Injury: Teeth and Oropharynx as per pre-operative assessment

## 2020-05-01 NOTE — Transfer of Care (Signed)
Immediate Anesthesia Transfer of Care Note  Patient: [name] [name]  Procedure(s) Performed: THROMBECTOMY OF LOWER EXTREMITY (Left Leg Lower) PATCH ANGIOPLASTY USING HEMASHIELD PLATINUM FINESSE PATCH (Left Leg Lower) FASCIOTOMY (Left Leg Lower) ENDARTERECTOMY POPLITEAL (Left Leg Lower)  Patient Location: PACU  Anesthesia Type:General  Level of Consciousness: awake  Airway & Oxygen Therapy: Patient Spontanous Breathing and Patient connected to face mask oxygen  Post-op Assessment: Report given to RN and Post -op Vital signs reviewed and stable  Post vital signs: Reviewed and stable  Last Vitals:  Vitals Value Taken Time  BP 109/76 05/01/20 1406  Temp    Pulse 91 05/01/20 1412  Resp 15 05/01/20 1412  SpO2 100 % 05/01/20 1412  Vitals shown include unvalidated device data.  Last Pain:  Vitals:   05/01/20 0820  TempSrc: Oral  PainSc:       Patients Stated Pain Goal: 4 (02/06/87 7195)  Complications: No complications documented.

## 2020-05-01 NOTE — Op Note (Signed)
Patient name: [name] [name] MRN: [id number] DOB: September 20, 1960 Sex: male  05/01/2020 Pre-operative Diagnosis: acute left lower extremity ischemia Post-operative diagnosis:  Same Surgeon:  Erlene Quan C. Donzetta Matters, MD Assistant:  Paulo Fruit, PA Procedure Performed: 1.  Reexposure of left below-knee popliteal, anterior tibial, tibioperoneal trunk and posterior tibial and peroneal arteries greater than 30 days 2.  Left lower extremity thromboembolectomy of bypass graft with stent, left anterior tibial artery 3.  Endarterectomy of left popliteal, left tibioperoneal trunk and peroneal arteries with dacron patch angioplasty 4.  4 compartment fasciotomies left lower extremity   Indications: 59 year old male with a history of a left femoropopliteal bypass for a toe ulceration.  This subsequently occluded in July he underwent endovascular treatment to reopen his bypass with single-vessel anterior tibial artery runoff.  He was noted to have an occluded bypass earlier this week he was taken back for lytic catheter placement and underwent endovascular thrombectomy with successful treatment and flow into the anterior tibial artery.  The peroneal artery was noted to reconstitute in the mid calf.  Unfortunately he had no signals in his foot had significant pain in the left foot this morning and was indicated for open thrombectomy.  Patient is also having significant pain anterior laterally on his left leg and we also discussed fasciotomies.  An assistant was necessary for this case for suction, retraction, assistance with anastomosis and wound closure.  Findings: There was acute thrombus in the bypass graft.  I opened up the popliteal artery up to the level of the stent where with established very strong inflow after thromboembolectomy with 4 Fogarty proximally.  After endarterectomy down into the peroneal artery and including the anterior tibial artery and thrombectomy of the anterior tibial artery I was able to  establish good by flow from the anterior tibial and even stronger backflow from the peroneal artery.  I then performed a patch angioplasty with a dacryon patch.  At completion there was very strong signal in the distal peroneal artery at the ankle.  We opened up the fascia medially and laterally there was no apparent requirement for fasciotomies and the wounds were primarily closed.    Procedure:  The patient was identified in the holding area and taken to the operating room where is placed supine on the operative table and general anesthesia was induced.  He was sterilely prepped and draped left lower extremity in the usual fashion antibiotics were up-to-date timeout was called.  We opened his previous incision longitudinally extending cephalad and caudally a few centimeters on either side.  We dissected down through dense scar tissue.  We first identified the bypass graft we were able to encircle this with a vessel loop.  Identified the popliteal vein we are able to retract this laterally.  This was injured in several places and was suture repaired.  We dissected free the anterior tibial artery placed a vessel loop around this.  We dissected down ligated the anterior tibial vein as well as a crossing branch at the level to peroneal artery.  We dissected free the tibioperoneal trunk down to the posterior tibial artery.  Patient was fully heparinized at this time.  We then opened from the tibioperoneal trunk onto the popliteal artery.  We opened all the way up to the level of the last stent strut I did not want to transect the stent or have to remove this.  We passed 3 and 4 Fogarty's proximally and we had very strong inflow and the graft was clamped.  We then performed endarterectomy from the popliteal artery all the way down into the peroneal artery.  We were able to serially dilated to 3 mm in the peroneal artery and had good backbleeding and this was clamped.  The anterior tibial artery we passed a 3 Fogarty  on 2 occasions until we had no further clot there was only minimal thrombus in the anterior tibial artery we did establish trickle backbleeding this was clamped.  We then performed patch angioplasty with a dacryon patch that was a long patch.  Prior to completion we allowed flushing all directions.  Upon completion we released our clamps we then had to replace it few repair stitches.  There was good signal in the wound bed and the intertibial artery proximally as well as the peroneal artery distally.  On the ankle we could only find a very strong peroneal artery signal 50 mg of protamine was administered.  We turned our attention laterally where longitudinal incision was made we dissected down through the deep fascia of the anterior lateral compartments.  Muscle did not bulge it was all healthy and reactive to cautery.  We obtained stasis in this wound and irrigated closed the skin with staples.  We turned our attention back medially.  We did at the place a few further repair stitches in our patch.  Ultimately we had hemostasis we irrigated and closed a layer with running Vicryl over the patch.  We then closed the skin with staples.  He was awakened from anesthesia having tolerated procedure without immediate complication.  All counts were correct at completion.  EBL: 1400 cc.  Transfusion: 1 unit packed red blood cells   Jeanclaude Wentworth C. Donzetta Matters, MD [facility] [place] Office: [phone] Pager: [phone]

## 2020-05-01 NOTE — Progress Notes (Signed)
  Progress Note    05/01/2020 9:33 AM 1 Day Post-Op  Subjective: Left foot is quite painful he cannot put any pressure on it he has minimal movement and states that it is numb  Vitals:   05/01/20 0434 05/01/20 0820  BP: 125/83 (!) 115/53  Pulse:  93  Resp:  20  Temp:  98.4 F (36.9 C)  SpO2:  96%    Physical Exam: He is awake alert and oriented x3 Nonlabored respirations Abdomen is soft Right groin is soft no hematoma Left foot is not significantly cooler than the right however there are no arterial signals in the ankle or foot.  He has minimal motor strength foot is numb.  He has significant anterior lateral compartment pain but the compartments are soft and there is no evidence of hematoma  CBC    Component Value Date/Time   WBC 6.7 05/01/2020 0728   RBC 3.86 (L) 05/01/2020 0728   HGB 9.1 (L) 05/01/2020 0728   HGB 13.9 03/13/2017 1032   HCT 29.4 (L) 05/01/2020 0728   HCT 41.6 03/13/2017 1032   PLT 181 05/01/2020 0728   PLT 204 03/13/2017 1032   MCV 76.2 (L) 05/01/2020 0728   MCV 84 03/13/2017 1032   MCH 23.6 (L) 05/01/2020 0728   MCHC 31.0 05/01/2020 0728   RDW 15.7 (H) 05/01/2020 0728   RDW 13.7 03/13/2017 1032   LYMPHSABS 1.7 01/22/2020 1937   LYMPHSABS 1.8 03/13/2017 1032   MONOABS 0.5 01/22/2020 1937   EOSABS 0.2 01/22/2020 1937   EOSABS 0.2 03/13/2017 1032   BASOSABS 0.1 01/22/2020 1937   BASOSABS 0.0 03/13/2017 1032    BMET    Component Value Date/Time   NA 133 (L) 04/30/2020 0447   NA 140 05/30/2018 1413   K 3.4 (L) 04/30/2020 0447   CL 97 (L) 04/30/2020 0447   CO2 23 04/30/2020 0447   GLUCOSE 118 (H) 04/30/2020 0447   BUN 17 04/30/2020 0447   BUN 9 05/30/2018 1413   CREATININE 0.96 04/30/2020 0447   CREATININE 0.80 11/27/2012 0956   CALCIUM 9.1 04/30/2020 0447   GFRNONAA >60 04/30/2020 0447   GFRNONAA >89 11/27/2012 0956   GFRAA >60 01/24/2020 0202   GFRAA >89 11/27/2012 0956    INR    Component Value Date/Time   INR 1.1 04/28/2020  1630     Intake/Output Summary (Last 24 hours) at 05/01/2020 0933 Last data filed at 05/01/2020 0735 Gross per 24 hour  Intake 1285.4 ml  Output 1150 ml  Net 135.4 ml     Assessment/plan:  59 y.o. male is s/p lysis of left lower extremity bypass graft.  Unfortunately this appears to be occluded again.  I discussed with the patient that this is a limb threatening situation.  We will proceed to the operating room for left lower extremity thrombectomy possible repeat bypass and likely will need fasciotomies as well.  I have reviewed his previous imaging going back to prior to his bypass.  His anterior tibial artery may not be a reliable target given that it appears to abruptly end at the ankle/proximal foot.  Peroneal artery may be a better bypass target if this is necessary.  We will plan for this in the hybrid room urgently.  Continue heparin drip.  Alphonzo Devera C. Donzetta Matters, MD [facility] [place] Office: [phone] Pager: [phone]  05/01/2020 9:33 AM

## 2020-05-01 NOTE — Anesthesia Postprocedure Evaluation (Signed)
Anesthesia Post Note  Patient: [name] [name]  Procedure(s) Performed: THROMBECTOMY OF LOWER EXTREMITY (Left Leg Lower) PATCH ANGIOPLASTY USING HEMASHIELD PLATINUM FINESSE PATCH (Left Leg Lower) FASCIOTOMY (Left Leg Lower) ENDARTERECTOMY POPLITEAL (Left Leg Lower)     Patient location during evaluation: PACU Anesthesia Type: General Level of consciousness: awake Pain management: pain level controlled Vital Signs Assessment: post-procedure vital signs reviewed and stable Respiratory status: spontaneous breathing, nonlabored ventilation, respiratory function stable and patient connected to nasal cannula oxygen Cardiovascular status: blood pressure returned to baseline and stable Postop Assessment: no apparent nausea or vomiting Anesthetic complications: no   No complications documented.  Last Vitals:  Vitals:   05/01/20 1630 05/01/20 1641  BP: 115/77 127/82  Pulse: 88 88  Resp: 18 19  Temp:  36.8 C  SpO2: 96% 96%    Last Pain:  Vitals:   05/01/20 1641  TempSrc: Oral  PainSc: 6                  [place] Alfredo P Roxie Kreeger

## 2020-05-01 NOTE — Progress Notes (Addendum)
Pt admitted today to [facility] from PACU.  Pt is A&O X4 and neuro intact.  Pt placed on telemetry and CCMD notified.  Vitals taken and all within normal range. Lower extremity pulses dopplerable. Pt is currently comfortable and not in pain. Pt received 1 unit of blood in PACU which was finished upon arrival.

## 2020-05-01 NOTE — Progress Notes (Signed)
[place] for Heparin Indication: leg ischemia, likely occluded fempop bypass graft  Allergies  Allergen Reactions  . Morphine And Related Rash  . Ramipril Rash and Other (See Comments)    Per patient on R arm and leg only; no angioedema  . Tramadol Itching and Rash    Patient Measurements: Height: 5\' 8"  (172.7 cm) Weight: 76 kg (167 lb 8.8 oz) IBW/kg (Calculated) : 68.4 Heparin Dosing Weight: 79.4 kg  Vital Signs: Temp: 98.2 F (36.8 C) (11/06 1641) Temp Source: Oral (11/06 1641) BP: 127/82 (11/06 1641) Pulse Rate: 88 (11/06 1641)  Labs: Recent Labs    04/29/20 0414 04/29/20 0757 04/29/20 2159 04/30/20 0447 04/30/20 0447 04/30/20 1548 04/30/20 1548 04/30/20 2257 05/01/20 0728  HGB  --  11.0*   < > 10.6*   < > 10.0*   < > 9.2* 9.1*  HCT  --  35.1*   < > 34.6*   < > 32.1*  --  28.6* 29.4*  PLT  --  255   < > 204   < > 180  --  181 181  APTT  --  37*  --   --   --   --   --   --   --   HEPARINUNFRC  --  <0.10*   < > <0.10*  --   --   --  0.11* 0.16*  CREATININE 1.13  --   --  0.96  --   --   --   --   --    < > = values in this interval not displayed.    Estimated Creatinine Clearance: 80.2 mL/min (by C-G formula based on SCr of 0.96 mg/dL).  Assessment: 59 y.o. male admitted for leg ischemia with likely occluded fempop bypass graft. Patient reports last taking pta Eliquis 04/28/20 at 1130.   Pt now s/p lysis therapy with thrombectomy in PV lab 11/5. Pharmacy asked to begin IV heparin.  Today, heparin level remains subtherapeutic at 0.16 on 1500 units/hr (after increased from 1200 units/hr). No pauses or issues with infusion overnight per RN and patient.   Hgb trending down from baseline, now 9.1. Plt wnl and stable. No reported bleeding s/sx.   PM f/u > pt just returned from OR without heparin running.  Goal of Therapy:  Heparin level 0.3-0.7 units/ml Monitor platelets by anticoagulation protocol: Yes  Plan:   Discussed with Dr. Donzetta Matters, will resume IV heparin at rate of 500 units/hr at 1730 PM tonight, with no titration overnight. Per Dr. Donzetta Matters will titrate up to goal level tomorrow. Daily heparin level and CBC Monitor for s/sx bleeding  [place] Crane, Roylene Reason, [facility] Clinical Pharmacist  05/01/2020 4:47 PM   [facility] pharmacy phone numbers are listed on [email or web address]

## 2020-05-01 NOTE — Progress Notes (Addendum)
[place] for Heparin Indication: leg ischemia, likely occluded fempop bypass graft  Allergies  Allergen Reactions  . Morphine And Related Rash  . Ramipril Rash and Other (See Comments)    Per patient on R arm and leg only; no angioedema  . Tramadol Itching and Rash    Patient Measurements: Height: 5\' 8"  (172.7 cm) Weight: 76 kg (167 lb 8.8 oz) IBW/kg (Calculated) : 68.4 Heparin Dosing Weight: 79.4 kg  Vital Signs: Temp: 98.4 F (36.9 C) (11/06 0820) Temp Source: Oral (11/06 0820) BP: 115/53 (11/06 0820) Pulse Rate: 93 (11/06 0820)  Labs: Recent Labs    04/28/20 1550 04/28/20 1550 04/28/20 1630 04/29/20 0414 04/29/20 0757 04/29/20 2159 04/30/20 0447 04/30/20 0447 04/30/20 1548 04/30/20 1548 04/30/20 2257 05/01/20 0728  HGB 11.5*   < >  --   --  11.0*   < > 10.6*   < > 10.0*   < > 9.2* 9.1*  HCT 38.3*   < >  --   --  35.1*   < > 34.6*   < > 32.1*  --  28.6* 29.4*  PLT 254   < >  --   --  255   < > 204   < > 180  --  181 181  APTT  --   --  33  --  37*  --   --   --   --   --   --   --   LABPROT  --   --  13.5  --   --   --   --   --   --   --   --   --   INR  --   --  1.1  --   --   --   --   --   --   --   --   --   HEPARINUNFRC  --   --   --   --  <0.10*   < > <0.10*  --   --   --  0.11* 0.16*  CREATININE 1.51*  --   --  1.13  --   --  0.96  --   --   --   --   --    < > = values in this interval not displayed.    Estimated Creatinine Clearance: 80.2 mL/min (by C-G formula based on SCr of 0.96 mg/dL).  Assessment: 59 y.o. male admitted for leg ischemia with likely occluded fempop bypass graft. Patient reports last taking pta Eliquis 04/28/20 at 1130.   Pt now s/p lysis therapy with thrombectomy in PV lab 11/5. Pharmacy asked to begin IV heparin.  Today, heparin level remains subtherapeutic at 0.16 on 1500 units/hr (after increased from 1200 units/hr). No pauses or issues with infusion overnight per RN and patient.   Hgb  trending down from baseline, now 9.1. Plt wnl and stable. No reported bleeding s/sx.   Will increase rate further to reach therapeutic levels.  Goal of Therapy:  Heparin level 0.3-0.7 units/ml Monitor platelets by anticoagulation protocol: Yes  Plan:  Increase Heparin to 1650 units/hr 6 hour heparin level Daily heparin level and CBC Monitor for s/sx bleeding  Fara Olden, PharmD PGY-1 Pharmacy Resident 05/01/2020 8:40 AM Please see AMION for all pharmacy numbers

## 2020-05-02 ENCOUNTER — Encounter (HOSPITAL_COMMUNITY): Payer: Self-pay | Admitting: Vascular Surgery

## 2020-05-02 LAB — TYPE AND SCREEN
ABO/RH(D): O POS
Antibody Screen: NEGATIVE
Unit division: 0

## 2020-05-02 LAB — BPAM RBC
Blood Product Expiration Date: 202112092359
ISSUE DATE / TIME: 202111061337
Unit Type and Rh: 5100

## 2020-05-02 LAB — CBC
HCT: 27 % — ABNORMAL LOW (ref 39.0–52.0)
Hemoglobin: 8.6 g/dL — ABNORMAL LOW (ref 13.0–17.0)
MCH: 25.1 pg — ABNORMAL LOW (ref 26.0–34.0)
MCHC: 31.9 g/dL (ref 30.0–36.0)
MCV: 78.9 fL — ABNORMAL LOW (ref 80.0–100.0)
Platelets: 240 10*3/uL (ref 150–400)
RBC: 3.42 MIL/uL — ABNORMAL LOW (ref 4.22–5.81)
RDW: 15.8 % — ABNORMAL HIGH (ref 11.5–15.5)
WBC: 8.2 10*3/uL (ref 4.0–10.5)
nRBC: 0 % (ref 0.0–0.2)

## 2020-05-02 LAB — HEPARIN LEVEL (UNFRACTIONATED)
Heparin Unfractionated: <0.1 IU/mL — ABNORMAL LOW (ref 0.30–0.70)
Heparin Unfractionated: 0.17 IU/mL — ABNORMAL LOW (ref 0.30–0.70)
Heparin Unfractionated: 0.29 IU/mL — ABNORMAL LOW (ref 0.30–0.70)

## 2020-05-02 NOTE — Progress Notes (Signed)
[name] [name] for Heparin Indication: leg ischemia, likely occluded fempop bypass graft  Allergies  Allergen Reactions  . Morphine And Related Rash  . Ramipril Rash and Other (See Comments)    Per patient on R arm and leg only; no angioedema  . Tramadol Itching and Rash    Patient Measurements: Height: 5\' 8"  (172.7 cm) Weight: 76 kg (167 lb 8.8 oz) IBW/kg (Calculated) : 68.4 Heparin Dosing Weight: 79.4 kg  Vital Signs: Temp: 97.6 F (36.4 C) (11/07 0752) Temp Source: Oral (11/07 0752) BP: 133/64 (11/07 0752) Pulse Rate: 82 (11/07 0752)  Labs: Recent Labs    04/30/20 0447 04/30/20 1548 04/30/20 2257 04/30/20 2257 05/01/20 0728 05/02/20 0456  HGB 10.6*   < > 9.2*   < > 9.1* 8.6*  HCT 34.6*   < > 28.6*  --  29.4* 27.0*  PLT 204   < > 181  --  181 240  HEPARINUNFRC <0.10*  --  0.11*  --  0.16* <0.10*  CREATININE 0.96  --   --   --   --   --    < > = values in this interval not displayed.    Estimated Creatinine Clearance: 80.2 mL/min (by C-G formula based on SCr of 0.96 mg/dL).  Assessment: 59 y.o. male admitted for leg ischemia with likely occluded fempop bypass graft. Patient reports last taking pta Eliquis 04/28/20 at 1130.   Pt now s/p lysis therapy with thrombectomy. Patient was started on low dose heparin 500 units/hr. Heparin level subtheraputic as expected. Per vascular surgery, full dose heparin today.   During this admission heparin level was subtherapeutic at 0.16 on 1500 units/hr (after increased from 1200 units/hr). CBC downtrended post-surgery. No issues with overt bleeding today per discussion with PA.   Goal of Therapy:  Heparin level 0.3-0.7 units/ml Monitor platelets by anticoagulation protocol: Yes  Plan:  Discussed with Dr. Donzetta Matters and PA Baglia Increase IV heparin to 1600 units/hr  Check HL in 6 hours Daily heparin level and CBC Monitor for s/sx bleeding  Norina Buzzard, PharmD PGY1 Pharmacy Resident 05/02/2020  9:19 AM    [facility] pharmacy phone numbers are listed on [email or web address]

## 2020-05-02 NOTE — Progress Notes (Signed)
[place] for Heparin Indication: leg ischemia, likely occluded fempop bypass graft  Allergies  Allergen Reactions  . Morphine And Related Rash  . Ramipril Rash and Other (See Comments)    Per patient on R arm and leg only; no angioedema  . Tramadol Itching and Rash    Patient Measurements: Height: 5\' 8"  (172.7 cm) Weight: 76 kg (167 lb 8.8 oz) IBW/kg (Calculated) : 68.4 Heparin Dosing Weight: 79.4 kg  Vital Signs: Temp: 98.3 F (36.8 C) (11/07 1228) Temp Source: Oral (11/07 1228) BP: 118/64 (11/07 1228) Pulse Rate: 86 (11/07 1228)  Labs: Recent Labs    04/30/20 0447 04/30/20 1548 04/30/20 2257 04/30/20 2257 05/01/20 0728 05/02/20 0456 05/02/20 1536  HGB 10.6*   < > 9.2*   < > 9.1* 8.6*  --   HCT 34.6*   < > 28.6*  --  29.4* 27.0*  --   PLT 204   < > 181  --  181 240  --   HEPARINUNFRC <0.10*  --  0.11*   < > 0.16* <0.10* 0.17*  CREATININE 0.96  --   --   --   --   --   --    < > = values in this interval not displayed.    Estimated Creatinine Clearance: 80.2 mL/min (by C-G formula based on SCr of 0.96 mg/dL).  Assessment: 59 y.o. male admitted for leg ischemia with likely occluded fempop bypass graft. Patient reports last taking pta Eliquis 04/28/20 at 1130.   Pt now s/p lysis therapy with thrombectomy. Patient was started on low dose heparin 500 units/hr. Heparin level subtheraputic as expected. Per vascular surgery, full dose heparin today.   PM f/u > heparin level low at 0.17.  No overt bleeding or complications noted.  No known issues with IV infusion.   Goal of Therapy:  Heparin level 0.3-0.7 units/ml Monitor platelets by anticoagulation protocol: Yes  Plan:  Increase IV heparin to 1750 units/hr. Check HL in 6 hours Daily heparin level and CBC Monitor for s/sx bleeding  [place] Crane, Roylene Reason, BCCP Clinical Pharmacist  05/02/2020 4:22 PM   [facility] pharmacy phone numbers are listed on [email or web address]

## 2020-05-02 NOTE — Progress Notes (Signed)
[place] for Heparin Indication: PAD s/p thrombectomy  Allergies  Allergen Reactions  . Morphine And Related Rash  . Ramipril Rash and Other (See Comments)    Per patient on R arm and leg only; no angioedema  . Tramadol Itching and Rash    Patient Measurements: Height: 5\' 8"  (172.7 cm) Weight: 76 kg (167 lb 8.8 oz) IBW/kg (Calculated) : 68.4 Heparin Dosing Weight: 79.4 kg  Vital Signs: Temp: 98 F (36.7 C) (11/07 2324) Temp Source: Oral (11/07 2324) BP: 124/77 (11/07 2324) Pulse Rate: 88 (11/07 2324)  Labs: Recent Labs    04/30/20 0447 04/30/20 1548 04/30/20 2257 04/30/20 2257 05/01/20 0728 05/01/20 0728 05/02/20 0456 05/02/20 1536 05/02/20 2304  HGB 10.6*   < > 9.2*   < > 9.1*  --  8.6*  --   --   HCT 34.6*   < > 28.6*  --  29.4*  --  27.0*  --   --   PLT 204   < > 181  --  181  --  240  --   --   HEPARINUNFRC <0.10*  --  0.11*   < > 0.16*   < > <0.10* 0.17* 0.29*  CREATININE 0.96  --   --   --   --   --   --   --   --    < > = values in this interval not displayed.    Estimated Creatinine Clearance: 80.2 mL/min (by C-G formula based on SCr of 0.96 mg/dL).  Assessment: 59 yo Male with thrombosed bypass graft s/p thrombectomy/thrombolysis for heparin Goal of Therapy:  Heparin level 0.3-0.7 units/ml Monitor platelets by anticoagulation protocol: Yes  Plan:  Increase Heparin 1850 units/hr   Phillis Knack, PharmD, BCPS  05/02/2020

## 2020-05-02 NOTE — Progress Notes (Signed)
Mobility Specialist: Progress Note   05/02/20 1316  Mobility  Activity Ambulated in room  Level of Assistance Minimal assist, patient does 75% or more  Assistive Device Front wheel walker  Distance Ambulated (ft) 4 ft  Mobility Response Tolerated well  Mobility performed by Mobility specialist  $Mobility charge 1 Mobility   Pre-Mobility: 86 HR, 120/93 BP, 98% SpO2 Post-Mobility: 95 HR, 116/76 BP, 98% SpO2  Pt c/o 5/10 pain in his L foot during ambulation. Pt said he is going to try to have his girlfriend bring inserts for his shoes so he can walk more tomorrow.   [facility] Kaylamarie Swickard Mobility Specialist

## 2020-05-02 NOTE — Progress Notes (Addendum)
Progress Note    05/02/2020 8:25 AM 1 Day Post-Op  Subjective:  Patient states he feels that most of his pain is secondary to RA nodules on plantar aspect of left foot. Has tolerated ambulating several steps. Able to move and feel left leg   Vitals:   05/02/20 0507 05/02/20 0752  BP: 123/87 133/64  Pulse: 78 82  Resp: 16 18  Temp: 97.8 F (36.6 C) 97.6 F (36.4 C)  SpO2: 98% 97%   Physical Exam: Cardiac: regular Lungs: non labored Incisions:  Left lower extremities incisions clean, dry and intact Extremities:  2+ femoral pulses bilaterally. No palpable distal pulses. Weak venous doppler signal L pero. Left foot cool. Motor and sensation intact 3/5 Neurologic: alert and oriented  CBC    Component Value Date/Time   WBC 8.2 05/02/2020 0456   RBC 3.42 (L) 05/02/2020 0456   HGB 8.6 (L) 05/02/2020 0456   HGB 13.9 03/13/2017 1032   HCT 27.0 (L) 05/02/2020 0456   HCT 41.6 03/13/2017 1032   PLT 240 05/02/2020 0456   PLT 204 03/13/2017 1032   MCV 78.9 (L) 05/02/2020 0456   MCV 84 03/13/2017 1032   MCH 25.1 (L) 05/02/2020 0456   MCHC 31.9 05/02/2020 0456   RDW 15.8 (H) 05/02/2020 0456   RDW 13.7 03/13/2017 1032   LYMPHSABS 1.7 01/22/2020 1937   LYMPHSABS 1.8 03/13/2017 1032   MONOABS 0.5 01/22/2020 1937   EOSABS 0.2 01/22/2020 1937   EOSABS 0.2 03/13/2017 1032   BASOSABS 0.1 01/22/2020 1937   BASOSABS 0.0 03/13/2017 1032    BMET    Component Value Date/Time   NA 133 (L) 04/30/2020 0447   NA 140 05/30/2018 1413   K 3.4 (L) 04/30/2020 0447   CL 97 (L) 04/30/2020 0447   CO2 23 04/30/2020 0447   GLUCOSE 118 (H) 04/30/2020 0447   BUN 17 04/30/2020 0447   BUN 9 05/30/2018 1413   CREATININE 0.96 04/30/2020 0447   CREATININE 0.80 11/27/2012 0956   CALCIUM 9.1 04/30/2020 0447   GFRNONAA >60 04/30/2020 0447   GFRNONAA >89 11/27/2012 0956   GFRAA >60 01/24/2020 0202   GFRAA >89 11/27/2012 0956    INR    Component Value Date/Time   INR 1.1 04/28/2020 1630      Intake/Output Summary (Last 24 hours) at 05/02/2020 0825 Last data filed at 05/02/2020 0500 Gross per 24 hour  Intake 4314.23 ml  Output 4550 ml  Net -235.77 ml     Assessment/Plan:  59 y.o. male is s/p   Reexposure of left below-knee popliteal, anterior tibial, tibioperoneal trunk and posterior tibial and peroneal arteries; Left lower extremity thromboembolectomy of bypass graft with stent, left anterior tibial artery; Endarterectomy of left popliteal, left tibioperoneal trunk and peroneal arteries with dacron patch angioplasty; 4 compartment fasciotomies left lower extremity 1 Day Post-Op. Left lower extremity pain improved. Motor and sensation intact. Weak venous peroneal signal in left leg. Left foot cool.  Hemodynamically stable. Afebrile. On Heparin IV. Patient remains at high risk for possible limb loss. OOB and mobilize as tolerated   DVT prophylaxis:  Heparin gtt   Karoline Caldwell, PA-C [facility] [phone] 05/02/2020 8:25 AM   I have independently interviewed and examined patient and agree with PA assessment and plan above.  He only has venous sounding signal in his left peroneal the foot the foot is warm and he is complaining less of pain today states the most of his pain is secondary to his arthritis.  He remains very high risk for requiring proximal leg amputation but at this time does not have any absolute indications.  We will continue heparin drip as he was on Eliquis as an outpatient.  He will need PT and OT.  Asli Tokarski C. Donzetta Matters, MD [facility] Office: [phone] Pager: [phone]

## 2020-05-03 ENCOUNTER — Encounter (HOSPITAL_COMMUNITY): Payer: Self-pay | Admitting: Vascular Surgery

## 2020-05-03 LAB — HEPARIN LEVEL (UNFRACTIONATED): Heparin Unfractionated: 0.33 IU/mL (ref 0.30–0.70)

## 2020-05-03 LAB — CBC
HCT: 24.4 % — ABNORMAL LOW (ref 39.0–52.0)
Hemoglobin: 7.6 g/dL — ABNORMAL LOW (ref 13.0–17.0)
MCH: 24.6 pg — ABNORMAL LOW (ref 26.0–34.0)
MCHC: 31.1 g/dL (ref 30.0–36.0)
MCV: 79 fL — ABNORMAL LOW (ref 80.0–100.0)
Platelets: 221 10*3/uL (ref 150–400)
RBC: 3.09 MIL/uL — ABNORMAL LOW (ref 4.22–5.81)
RDW: 16.1 % — ABNORMAL HIGH (ref 11.5–15.5)
WBC: 7.9 10*3/uL (ref 4.0–10.5)
nRBC: 0.3 % — ABNORMAL HIGH (ref 0.0–0.2)

## 2020-05-03 MED ORDER — OXYCODONE HCL 5 MG PO TABS
5.0000 mg | ORAL_TABLET | ORAL | Status: DC | PRN
  Administered 2020-05-03 – 2020-05-04 (×5): 10 mg via ORAL
  Filled 2020-05-03 (×5): qty 2

## 2020-05-03 MED ORDER — APIXABAN 5 MG PO TABS
5.0000 mg | ORAL_TABLET | Freq: Two times a day (BID) | ORAL | Status: DC
  Administered 2020-05-03 – 2020-05-04 (×3): 5 mg via ORAL
  Filled 2020-05-03 (×3): qty 1

## 2020-05-03 MED FILL — Lidocaine HCl Local Preservative Free (PF) Inj 1%: INTRAMUSCULAR | Qty: 30 | Status: AC

## 2020-05-03 NOTE — Progress Notes (Addendum)
Progress Note    05/03/2020 7:59 AM 2 Days Post-Op  Subjective:  Says he feels like more feeling in left leg. Says its in and out of feeling awake and asleep   Vitals:   05/02/20 2324 05/03/20 0323  BP: 124/77 121/69  Pulse: 88 88  Resp: 18 17  Temp: 98 F (36.7 C) 97.9 F (36.6 C)  SpO2: 96% 95%   Physical Exam: Cardiac: regular Lungs: non labored Incisions:  Left lower extremity fasciotomy incisions intact. Dressings dry Extremities:  2+ femoral pulses bilaterally, no distal pulses Abdomen: soft, non tender Neurologic: alert and oriented  CBC    Component Value Date/Time   WBC 7.9 05/03/2020 0426   RBC 3.09 (L) 05/03/2020 0426   HGB 7.6 (L) 05/03/2020 0426   HGB 13.9 03/13/2017 1032   HCT 24.4 (L) 05/03/2020 0426   HCT 41.6 03/13/2017 1032   PLT 221 05/03/2020 0426   PLT 204 03/13/2017 1032   MCV 79.0 (L) 05/03/2020 0426   MCV 84 03/13/2017 1032   MCH 24.6 (L) 05/03/2020 0426   MCHC 31.1 05/03/2020 0426   RDW 16.1 (H) 05/03/2020 0426   RDW 13.7 03/13/2017 1032   LYMPHSABS 1.7 01/22/2020 1937   LYMPHSABS 1.8 03/13/2017 1032   MONOABS 0.5 01/22/2020 1937   EOSABS 0.2 01/22/2020 1937   EOSABS 0.2 03/13/2017 1032   BASOSABS 0.1 01/22/2020 1937   BASOSABS 0.0 03/13/2017 1032    BMET    Component Value Date/Time   NA 133 (L) 04/30/2020 0447   NA 140 05/30/2018 1413   K 3.4 (L) 04/30/2020 0447   CL 97 (L) 04/30/2020 0447   CO2 23 04/30/2020 0447   GLUCOSE 118 (H) 04/30/2020 0447   BUN 17 04/30/2020 0447   BUN 9 05/30/2018 1413   CREATININE 0.96 04/30/2020 0447   CREATININE 0.80 11/27/2012 0956   CALCIUM 9.1 04/30/2020 0447   GFRNONAA >60 04/30/2020 0447   GFRNONAA >89 11/27/2012 0956   GFRAA >60 01/24/2020 0202   GFRAA >89 11/27/2012 0956    INR    Component Value Date/Time   INR 1.1 04/28/2020 1630     Intake/Output Summary (Last 24 hours) at 05/03/2020 0759 Last data filed at 05/03/2020 0600 Gross per 24 hour  Intake 340.03 ml  Output  2750 ml  Net -2409.97 ml     Assessment/Plan:  59 y.o. male is s/p Reexposure of left below-knee popliteal, anterior tibial, tibioperoneal trunk and posterior tibial and peroneal arteries; Left lower extremity thromboembolectomy of bypass graft with stent, left anterior tibial artery; Endarterectomy of left popliteal, left tibioperoneal trunk and peroneal arteries with dacron patch angioplasty;4 compartment fasciotomies left lower extremity 2 Days Post-Op. Bypass is occluded. Continues to have motor and sensation in left foot. Left foot is warmer today. Complaining of less pain overall compared to pre op. Remains at high risk for proximal amputation. Transition back to Eliquis today. Will Have him work with PT today. Possible discharge later today or tomorrow. Will need close follow up to assess for indications for timing of amputation  DVT prophylaxis:  Heparin gtt   Karoline Caldwell, PA-C [facility] [phone] 05/03/2020 7:59 AM   I agree with the above.  I have seen and evaluated the patient.  I believe his bypass graft remains occluded.  He seems to be tolerating this better than when he initially presented.  I do not think he has any other options for revascularization.  His next operation will be an above-knee amputation.  We will try to see if he can live with his current level of blood flow.  He will be transitioned back to Eliquis.  I will have him work with physical therapy today.  He can be discharged home when he is ready.  Annamarie Major

## 2020-05-03 NOTE — Discharge Instructions (Signed)
 [facility]  Discharge instructions  Lower Extremity Bypass Surgery  Please refer to the following instruction for your post-procedure care. Your surgeon or physician assistant will discuss any changes with you.  Activity  You are encouraged to walk as much as you can. You can slowly return to normal activities during the month after your surgery. Avoid strenuous activity and heavy lifting until your doctor tells you it's OK. Avoid activities such as vacuuming or swinging a golf club. Do not drive until your doctor give the OK and you are no longer taking prescription pain medications. It is also normal to have difficulty with sleep habits, eating and bowel movement after surgery. These will go away with time.  Bathing/Showering  Shower daily after you go home. Do not soak in a bathtub, hot tub, or swim until the incision heals completely.  Incision Care  Clean your incision with mild soap and water. Shower every day. Pat the area dry with a clean towel. You do not need a bandage unless otherwise instructed. Do not apply any ointments or creams to your incision. If you have open wounds you will be instructed how to care for them or a visiting nurse may be arranged for you. If you have staples or sutures along your incision they will be removed at your post-op appointment. You may have skin glue on your incision. Do not peel it off. It will come off on its own in about one week.  Wash the groin wound with soap and water daily and pat dry. (No tub bath-only shower)  Then put a dry gauze or washcloth in the groin to keep this area dry to help prevent wound infection.  Do this daily and as needed.  Do not use Vaseline or neosporin on your incisions.  Only use soap and water on your incisions and then protect and keep dry.  Diet  Resume your normal diet. There are no special food restrictions following this procedure. A low fat/ low cholesterol diet is  recommended for all patients with vascular disease. In order to heal from your surgery, it is CRITICAL to get adequate nutrition. Your body requires vitamins, minerals, and protein. Vegetables are the best source of vitamins and minerals. Vegetables also provide the perfect balance of protein. Processed food has little nutritional value, so try to avoid this.  Medications  Resume taking all your medications unless your doctor or physician assistant tells you not to. If your incision is causing pain, you may take over-the-counter pain relievers such as acetaminophen (Tylenol). If you were prescribed a stronger pain medication, please aware these medication can cause nausea and constipation. Prevent nausea by taking the medication with a snack or meal. Avoid constipation by drinking plenty of fluids and eating foods with high amount of fiber, such as fruits, vegetables, and grains. Take Colace 100 mg (an over-the-counter stool softener) twice a day as needed for constipation.  Do not take Tylenol if you are taking prescription pain medications.  Follow Up  Our office will schedule a follow up appointment 2-3 weeks following discharge.  Please call us immediately for any of the following conditions  Severe or worsening pain in your legs or feet while at rest or while walking Increase pain, redness, warmth, or drainage (pus) from your incision site(s) Fever of 101 degree or higher The swelling in your leg with the bypass suddenly worsens and becomes more painful than when you were in the hospital If you have   been instructed to feel your graft pulse then you should do so every day. If you can no longer feel this pulse, call the office immediately. Not all patients are given this instruction.  Leg swelling is common after leg bypass surgery.  The swelling should improve over a few months following surgery. To improve the swelling, you may elevate your legs above the level of your heart while you are  sitting or resting. Your surgeon or physician assistant may ask you to apply an ACE wrap or wear compression (TED) stockings to help to reduce swelling.  Reduce your risk of vascular disease  Stop smoking. If you would like help call [facility] at 1-800-QUIT-NOW (1-800-784-8669) or [place] at 336-586-4000.  Manage your cholesterol Maintain a desired weight Control your diabetes weight Control your diabetes Keep your blood pressure down  If you have any questions, please call the office at [phone]  

## 2020-05-03 NOTE — Care Management Important Message (Signed)
Important Message  Patient Details  Name: [name] [name] MRN: [id number] Date of Birth: 1961-05-30   Medicare Important Message Given:  Yes     [name] 05/03/2020, 9:57 AM

## 2020-05-03 NOTE — Progress Notes (Addendum)
[facility] for Heparin>>Apixaban Indication: leg ischemia, likely occluded fempop bypass graft  Allergies  Allergen Reactions  . Morphine And Related Rash  . Ramipril Rash and Other (See Comments)    Per patient on R arm and leg only; no angioedema  . Tramadol Itching and Rash    Patient Measurements: Height: 5\' 8"  (172.7 cm) Weight: 76 kg (167 lb 8.8 oz) IBW/kg (Calculated) : 68.4 Heparin Dosing Weight: 79.4 kg  Vital Signs: Temp: 97.9 F (36.6 C) (11/08 0323) Temp Source: Oral (11/08 0323) BP: 121/69 (11/08 0323) Pulse Rate: 88 (11/08 0323)  Labs: Recent Labs    05/01/20 0728 05/01/20 0728 05/02/20 0456 05/02/20 0456 05/02/20 1536 05/02/20 2304 05/03/20 0426  HGB 9.1*   < > 8.6*  --   --   --  7.6*  HCT 29.4*  --  27.0*  --   --   --  24.4*  PLT 181  --  240  --   --   --  221  HEPARINUNFRC 0.16*   < > <0.10*   < > 0.17* 0.29* 0.33   < > = values in this interval not displayed.    Estimated Creatinine Clearance: 80.2 mL/min (by C-G formula based on SCr of 0.96 mg/dL).  Assessment: 59 y.o. male admitted for leg ischemia with likely occluded fempop bypass graft. Patient reports last taking pta Eliquis 04/28/20 at 1130.   Pt now s/p lysis therapy with thrombectomy. Patient was started on low dose heparin 500 units/hr. Heparin level subtheraputic as expected. Per vascular surgery, full dose heparin.  Heparin level in range at 0.33.  No overt bleeding or complications noted.  No known issues with IV infusion.   Goal of Therapy:  Heparin level 0.3-0.7 units/ml Monitor platelets by anticoagulation protocol: Yes  Plan:  Continue IV heparin at 1850 units/hr. Check confirmatory HL in 6 hours Daily heparin level and CBC Monitor for s/sx bleeding  Ezariah Nace A. Levada Dy, PharmD, BCPS, FNKF Clinical Pharmacist [place] Please utilize Amion for appropriate phone number to reach the unit pharmacist ([place])    05/03/2020 7:13  AM   Addendum:  Patient to be changed from heparin back to apixaban per [facility] consult. Will resume home dose of apixaban 5mg  BID. D/C heparin and heparin level.

## 2020-05-03 NOTE — Evaluation (Signed)
Physical Therapy Evaluation Patient Details Name: [name] [name] MRN: [id number] DOB: [birth year] Today's Date: 05/03/2020   History of Present Illness  59 y.o. male he is status post prior left femoral to popliteal bypass by Dr. Trula Slade on 02/26/2019. Presents to ED with 2-3 days pain in L foot. Admitted 11/03 with critical limb ischemia. Found to have occluded bypass. s/p 11/5 L LE angiogram, suction throbectomy and angioplasty popliteal and anterior tibila arteries 11/6 Reexposure of left below-knee popliteal, anterior tibial, tibioperoneal trunk and posterior tibial and peroneal arteries; Left lower extremity thromboembolectomy of bypass graft with stent, left anterior tibial artery; Endarterectomy of left popliteal, left tibioperoneal trunk and peroneal arteries with dacron patch angioplasty; 4 compartment fasciotomies left lower extremity   Clinical Impression  PTA pt living with numerous roomates in a single story home with 3 steps to enter. Pt reports that he was able to ambulate household distances without AD, however was limited in distance by claudication pain in L LE. Pt independent in ADLs and iADLs. Pt currently has poor understanding of his current situation, stating numerous times that it is the arthritis in his L foot that is keeping him from being able to walk. Pt is mod I for bed mobility and min guard for sit<>stand to RW. Pt only able to perform hopping transfer to recliner with RW and min A. Pt reports he does not have the insert for his shoe that will make ambulation easier and that his girlfriend will be bringing it in. Insert may be beneficial but PT does not believe it will provide assistance pt is expecting. PT recommends HHPT level rehab at discharge. PT will continue to follow acutely to progress mobility and stair training.     Follow Up Recommendations Home health PT;Supervision/Assistance - 24 hour    Equipment Recommendations  Rolling walker with 5" wheels     Recommendations for Other Services OT consult     Precautions / Restrictions Precautions Precautions: Fall Restrictions Weight Bearing Restrictions: No      Mobility  Bed Mobility Overal bed mobility: Modified Independent             General bed mobility comments: HoB elevated, light use of bed rails    Transfers Overall transfer level: Needs assistance Equipment used: Rolling walker (2 wheeled) Transfers: Sit to/from Stand Sit to Stand: Min assist         General transfer comment: min A for power up to RW, vc for hand placement   Ambulation/Gait Ambulation/Gait assistance: Min assist Gait Distance (Feet): 2 Feet Assistive device: Rolling walker (2 wheeled) Gait Pattern/deviations: Decreased weight shift to left;Decreased stance time - left;Trunk flexed (hop to pattern ) Gait velocity: slowed Gait velocity interpretation: <1.31 ft/sec, indicative of household ambulator General Gait Details: min A for steadying with RW, increased L LE pain kept pt from weightbearing, utilized RW to hop to recliner on his R, vc for proximity to [employer] Overall balance assessment: Needs assistance Sitting-balance support: No upper extremity supported;Feet unsupported Sitting balance-Leahy Scale: Good     Standing balance support: Bilateral upper extremity supported;During functional activity;Single extremity supported Standing balance-Leahy Scale: Poor Standing balance comment: requires UE support due to L foot pain                              Pertinent Vitals/Pain Pain Assessment: Faces Faces Pain Scale: Hurts even more Pain Location: L  foot in dependent position  Pain Descriptors / Indicators: Pins and needles;Stabbing Pain Intervention(s): Limited activity within patient's tolerance;Monitored during session;Repositioned    Home Living Family/patient expects to be discharged to:: Private residence Living Arrangements: Other (Comment) (multiple  room mates ) Available Help at Discharge: Family;Friend(s) Type of Home: House Home Access: Stairs to enter Entrance Stairs-Rails: Psychiatric nurse of Steps: 3 Home Layout: One level Home Equipment: Tub bench;Hand held shower head      Prior Function Level of Independence: Independent               Hand Dominance   Dominant Hand: Left    Extremity/Trunk Assessment   Upper Extremity Assessment Upper Extremity Assessment: Overall WFL for tasks assessed    Lower Extremity Assessment Lower Extremity Assessment: LLE deficits/detail;RLE deficits/detail RLE Deficits / Details: Overall WFL  LLE Deficits / Details: foot pale and grayish, hip, knee and foot AROM limited by pain  LLE: Unable to fully assess due to pain LLE Sensation: decreased light touch (pins and needles in toes) LLE Coordination: decreased fine motor       Communication   Communication: No difficulties  Cognition Arousal/Alertness: Awake/alert Behavior During Therapy: WFL for tasks assessed/performed Overall Cognitive Status: Within Functional Limits for tasks assessed                                 General Comments: poor understanding of the gravity of his vascular problem, keeps referring to OA in his foot being the source of his pain       General Comments General comments (skin integrity, edema, etc.): L foot pale and cool to touch, VSS on RA        Assessment/Plan    PT Assessment Patient needs continued PT services  PT Problem List Decreased activity tolerance;Decreased balance;Decreased mobility;Cardiopulmonary status limiting activity;Pain;Impaired sensation;Decreased safety awareness;Decreased coordination       PT Treatment Interventions DME instruction;Gait training;Stair training;Functional mobility training;Therapeutic activities;Therapeutic exercise;Balance training;Cognitive remediation;Patient/family education    PT Goals (Current goals can be  found in the Care Plan section)  Acute Rehab PT Goals Patient Stated Goal: get back to hunting and fishing PT Goal Formulation: With patient Time For Goal Achievement: 05/17/20 Potential to Achieve Goals: Good    Frequency Min 3X/week    AM-PAC PT "6 Clicks" Mobility  Outcome Measure Help needed turning from your back to your side while in a flat bed without using bedrails?: None Help needed moving from lying on your back to sitting on the side of a flat bed without using bedrails?: None Help needed moving to and from a bed to a chair (including a wheelchair)?: A Little Help needed standing up from a chair using your arms (e.g., wheelchair or bedside chair)?: A Little Help needed to walk in hospital room?: A Lot Help needed climbing 3-5 steps with a railing? : A Lot 6 Click Score: 18    End of Session   Activity Tolerance: Patient limited by pain Patient left: in chair;with call bell/phone within reach Nurse Communication: Mobility status PT Visit Diagnosis: Unsteadiness on feet (R26.81);Other abnormalities of gait and mobility (R26.89);Difficulty in walking, not elsewhere classified (R26.2);Pain Pain - Right/Left: Left Pain - part of body: Ankle and joints of foot    Time: 0093-8182 PT Time Calculation (min) (ACUTE ONLY): 22 min   Charges:   PT Evaluation $PT Eval Moderate Complexity: 1 Mod  Sharde Gover B. Migdalia Dk PT, DPT Acute Rehabilitation Services Pager [phone] Office [phone]   [place] 05/03/2020, 9:23 AM

## 2020-05-04 LAB — CBC
HCT: 25.6 % — ABNORMAL LOW (ref 39.0–52.0)
Hemoglobin: 7.9 g/dL — ABNORMAL LOW (ref 13.0–17.0)
MCH: 24.4 pg — ABNORMAL LOW (ref 26.0–34.0)
MCHC: 30.9 g/dL (ref 30.0–36.0)
MCV: 79 fL — ABNORMAL LOW (ref 80.0–100.0)
Platelets: 281 10*3/uL (ref 150–400)
RBC: 3.24 MIL/uL — ABNORMAL LOW (ref 4.22–5.81)
RDW: 16.3 % — ABNORMAL HIGH (ref 11.5–15.5)
WBC: 9.1 10*3/uL (ref 4.0–10.5)
nRBC: 0.3 % — ABNORMAL HIGH (ref 0.0–0.2)

## 2020-05-04 NOTE — Progress Notes (Addendum)
Progress Note    05/04/2020 7:50 AM 3 Days Post-Op  Subjective:  Slept well last night. Feels left foot pain is improved with his prednisone   Vitals:   05/03/20 2324 05/04/20 0352  BP: 129/80 (!) 152/84  Pulse: 96 97  Resp: 18 16  Temp: 98.5 F (36.9 C) 98 F (36.7 C)  SpO2: 100% 97%    Physical Exam: Cardiac:  RRR Lungs:  CTAB Incisions:  Left lower leg incisions are well approximated Extremities:  Left forefoot cool to touch. No skin breakdown. Right foot warm Abdomen:  Soft, ND  CBC    Component Value Date/Time   WBC 9.1 05/04/2020 0224   RBC 3.24 (L) 05/04/2020 0224   HGB 7.9 (L) 05/04/2020 0224   HGB 13.9 03/13/2017 1032   HCT 25.6 (L) 05/04/2020 0224   HCT 41.6 03/13/2017 1032   PLT 281 05/04/2020 0224   PLT 204 03/13/2017 1032   MCV 79.0 (L) 05/04/2020 0224   MCV 84 03/13/2017 1032   MCH 24.4 (L) 05/04/2020 0224   MCHC 30.9 05/04/2020 0224   RDW 16.3 (H) 05/04/2020 0224   RDW 13.7 03/13/2017 1032   LYMPHSABS 1.7 01/22/2020 1937   LYMPHSABS 1.8 03/13/2017 1032   MONOABS 0.5 01/22/2020 1937   EOSABS 0.2 01/22/2020 1937   EOSABS 0.2 03/13/2017 1032   BASOSABS 0.1 01/22/2020 1937   BASOSABS 0.0 03/13/2017 1032    BMET    Component Value Date/Time   NA 133 (L) 04/30/2020 0447   NA 140 05/30/2018 1413   K 3.4 (L) 04/30/2020 0447   CL 97 (L) 04/30/2020 0447   CO2 23 04/30/2020 0447   GLUCOSE 118 (H) 04/30/2020 0447   BUN 17 04/30/2020 0447   BUN 9 05/30/2018 1413   CREATININE 0.96 04/30/2020 0447   CREATININE 0.80 11/27/2012 0956   CALCIUM 9.1 04/30/2020 0447   GFRNONAA >60 04/30/2020 0447   GFRNONAA >89 11/27/2012 0956   GFRAA >60 01/24/2020 0202   GFRAA >89 11/27/2012 0956     Intake/Output Summary (Last 24 hours) at 05/04/2020 0750 Last data filed at 05/04/2020 0334 Gross per 24 hour  Intake 740 ml  Output 1200 ml  Net -460 ml    HOSPITAL MEDICATIONS Scheduled Meds: . amLODipine  10 mg Oral Daily  . apixaban  5 mg Oral BID  .  atorvastatin  80 mg Oral q1800  . Chlorhexidine Gluconate Cloth  6 each Topical Daily  . clopidogrel  75 mg Oral Daily  . gabapentin  100 mg Oral Daily  . irbesartan  37.5 mg Oral Daily  . pantoprazole  40 mg Oral Daily  . predniSONE  5 mg Oral Daily  . sodium chloride flush  10-40 mL Intracatheter Q12H  . sodium chloride flush  3 mL Intravenous Q12H  . sodium chloride flush  3 mL Intravenous Q12H   Continuous Infusions: . sodium chloride Stopped (05/01/20 1412)  . sodium chloride    . sodium chloride     PRN Meds:.sodium chloride, sodium chloride, acetaminophen, alum & mag hydroxide-simeth, bisacodyl, guaiFENesin-dextromethorphan, hydrALAZINE, HYDROmorphone (DILAUDID) injection, labetalol, metoprolol tartrate, midazolam, ondansetron (ZOFRAN) IV, oxyCODONE, phenol, senna-docusate, sodium chloride flush, sodium chloride flush, sodium chloride flush  Assessment: POD 3 59 y.o. male is s/p reexposure of left below-knee popliteal, anterior tibial, tibioperoneal trunk and posterior tibial and peroneal arteries;Left lower extremity thromboembolectomy of bypass graft with stent, left anterior tibial artery;Endarterectomy of left popliteal, left tibioperoneal trunk and peroneal arteries with dacron patch angioplasty;4 compartment fasciotomies left  lower extremity. Bypass is occluded. He is back on Eliquis; on Plavix.  He understands there are no other options for revascularization and is ready for discharge home with HHPT.  Medical management with watchful waiting on pain syndrome. Primary amputation if/when pain no longer tolerable.  Acute blood loss anemia with relatively stable H and H. Improved today. VSS.  Plan: -DC home with HHPT -DVT prophylaxis:  On apixaban   Risa Grill, PA-C [facility] [phone] 05/04/2020  7:50 AM

## 2020-05-04 NOTE — Discharge Summary (Signed)
Bypass Discharge Summary Patient ID: [name] [name] [id number] 59 y.o. [birth year]  Admit date: 04/28/2020  Discharge date and time: 05/04/2020  Admitting Physician: Rosetta Posner, MD   Discharge Physician: Serafina Mitchell, MD  Admission Diagnoses: Limb ischemia [I99.8] Occlusion of femoropopliteal bypass graft, initial encounter [facility]) [I78.676H] Critical limb ischemia with history of revascularization of same extremity ([place]) [I70.229, Z98.890]  Discharge Diagnoses: Limb ischemia [I99.8] Occlusion of femoropopliteal bypass graft, initial encounter ([place]) [T82.898A] Critical limb ischemia with history of revascularization of same extremity ([place]) [I70.229, Z98.890]  Admission Condition: fair  Discharged Condition: fair  Indication for Admission: critical limb ischemia; left foot pain  Hospital Course: 59 year old male presents to the emergency department with acute left foot pain.  He was interviewed and examined and found to have absent pedal pulses bilaterally.  He had previously undergone left femoral to popliteal bypass by Dr. Trula Slade on February 26, 2019 with Gore-Tex prosthetic graft.  He presented in late July 2021 with recurrent ischemia found to have occlusion of his femoral-popliteal bypass and underwent lytic therapy and tenting of the distal anastomosis.  He was admitted with critical limb ischemia and likely occlusion of his bypass. The following day he was taken to the peripheral vascular lab and underwent catheter directed thrombolytic therapy.  He was returned to the [facility] lab the following day for recheck.  Suction thrombectomy and angioplasty of the popliteal and anterior tibial arteries was performed.  Unfortunately, despite heparin infusion, he had no pulses the following day and ongoing rest pain.  He was informed of occlusion of his graft and need for operative intervention due to limb threatening circumstances.  He was taken to the operating room on 11/6 and underwent left  lower extremity thromboembolectomy of bypass graft with stent, endarterectomy of the left popliteal artery left TP trunk and peroneal arteries as well as 4 compartment fasciotomies of the left lower extremity. The lower leg incisions were able to be closed primarily. He was transfused one unit PRBCs. On postoperative day 1, his vital signs were stable he was afebrile.  He had intact but decreased motor function and sensation.  He felt like most of his pain was due to rheumatoid arthritis nodules on the plantar aspect of his left foot.  Fasciotomy sites were without bleeding.  Heparin was continued.  He was informed of his very high risk for requiring proximal leg amputation but currently no absolute indications for this.  Heparin infusion was continued.  PT and OT evaluations continued. Over the next two days, his pain was tolerable and his vital signs remained stable. His fasciotomy incisions were healing without signs of infection. On POD 3, his was discharged home with home health PT and OT.   Consults: None  Treatments:  04/29/2020:Placement of thrombolytic's catheter in the left common femoral to below-knee popliteal prosthetic bypass with initiation of catheter directed thrombolytics.  04/30/2020: 1. Left lower extremity angiogram (51mL contrast) 2. Suction thrombectomy (CAT6 [place]umb[place]) left femoral popliteal bypass 3. Angioplasty popliteal artery (3x30mm [place]) 4. Angioplasty anterior tibial artery (3x20mm [place])  2) 05/01/2020 surgery: Reexposure of left below-knee popliteal, anterior tibial, tibioperoneal trunk and posterior tibial and peroneal arteries; Left lower extremity thromboembolectomy of bypass graft with stent, left anterior tibial artery; Endarterectomy of left popliteal, left tibioperoneal trunk and peroneal arteries with dacron patch angioplasty; 4 compartment fasciotomies left lower extremity.   Disposition: Discharge disposition: 06-Home-Health Care Svc       - For  [facility] Registry use ---  Post-op:  Wound infection: No  Graft infection: No  Transfusion: Yes  If yes, 1 units given New Arrhythmia: No Patency judged by: [ ]  Dopper only, [ ]  Palpable graft pulse, [ ]  Palpable distal pulse, [ ]  ABI inc. > 0.15, [ ]  Duplex Discharge ABI: R , L  Discharge TBI: R , L  D/C Ambulatory Status: Ambulatory with Assistance  Complications: MI: [ ]  No, [ ]  Troponin only, [ ]  EKG or Clinical CHF: No Resp failure: [ ]  none, [ ]  Pneumonia, [ ]  Ventilator Chg in renal function: [ ]  none, [ ]  Inc. Cr > 0.5, [ ]  Temp. Dialysis, [ ]  Permanent dialysis Stroke: [ ]  None, [ ]  Minor, [ ]  Major Return to OR: No  Reason for return to OR: [ ]  Bleeding, [ ]  Infection, [ ]  Thrombosis, [ ]  Revision  Discharge medications: Statin use:  Yes ASA use:  No  for medical reason on Eliquis and Plavix Plavix use:  Yes Beta blocker use: No  for medical reason unknown Coumadin use: No  for medical reason not indicated    Patient Instructions:  Allergies as of 05/04/2020       Reactions   Morphine And Related Rash   Ramipril Rash, Other (See Comments)   Per patient on R arm and leg only; no angioedema   Tramadol Itching, Rash        Medication List     STOP taking these medications    nicotine 21 mg/24hr patch Commonly known as: NICODERM CQ - dosed in mg/24 hours   pantoprazole 40 MG tablet Commonly known as: PROTONIX       TAKE these medications    amLODipine 10 MG tablet Commonly known as: NORVASC Take 1 tablet (10 mg total) by mouth daily.   apixaban 5 MG Tabs tablet Commonly known as: ELIQUIS Take 1 tablet (5 mg total) by mouth 2 (two) times daily.   ascorbic acid 500 MG tablet Commonly known as: VITAMIN C Take 500 mg by mouth every other day. Notes to patient: Take as you were prior to admission   atorvastatin 80 MG tablet Commonly known as: LIPITOR Take 1 tablet (80 mg total) by mouth daily at 6 PM.   chlorthalidone 25 MG tablet Commonly known  as: HYGROTON Take 1 tablet (25 mg total) by mouth daily. Notes to patient: Take as you were prior to admission   clopidogrel 75 MG tablet Commonly known as: Plavix Take 1 tablet (75 mg total) by mouth daily.   docusate sodium 100 MG capsule Commonly known as: COLACE Take 1 capsule (100 mg total) by mouth daily. What changed:   when to take this  reasons to take this   gabapentin 100 MG capsule Commonly known as: NEURONTIN Take 100 mg by mouth at bedtime as needed (for pain).   ibuprofen 200 MG tablet Commonly known as: ADVIL Take 200-600 mg by mouth every 6 (six) hours as needed for headache or mild pain.   naloxone 2 MG/2ML injection Commonly known as: NARCAN Inject 2 mg into the muscle once as needed (opiod overdose (call 911, inject intramuscularly in shoulder or thigh. Repeat every 3 minutes)).   oxyCODONE-acetaminophen 10-325 MG tablet Commonly known as: PERCOCET Take 1 tablet by mouth 4 (four) times daily as needed for pain.   predniSONE 5 MG tablet Commonly known as: DELTASONE TAKE 1 TABLET BY MOUTH EVERY DAY WITH BREAKFAST What changed: See the new instructions.   REFRESH DRY EYE THERAPY OP Place 1 drop  into both eyes 3 (three) times daily as needed (for dryness or irritation).   valsartan 40 MG tablet Commonly known as: DIOVAN Take 40 mg by mouth daily.               Durable Medical Equipment  (From admission, onward)           Start     Ordered   05/04/20 0955  For home use only DME 3 n 1  Once        05/04/20 0955   05/04/20 0955  For home use only DME Walker rolling  Once       Question Answer Comment  Walker: With 5 Inch Wheels   Patient needs a walker to treat with the following condition Weakness      05/04/20 0955           Activity: activity as tolerated Diet: low fat, low cholesterol diet Wound Care: keep wound clean and dry  Follow-up with Dr. Stanford Breed in 3 weeks.  Signed: Barbie Banner 05/04/2020 12:05 PM

## 2020-05-04 NOTE — Progress Notes (Signed)
Patient given discharge instructions, medication list and follow up appointments. Patient verbalized understanding. All questions answered.  Patient IV was removed and dressing applied. Patient at this time having difficulty finding a ride home. Case manager will be made aware. Pt equiment for home has been delivered to pt room. Once discharged will be transported to exit via wheel chair and nursing staff. Nastasha Reising, Bettina Gavia Rn

## 2020-05-04 NOTE — Care Plan (Addendum)
Patient has follow-up appointment scheduled for 05/25/2020 with Dr. Standley Dakins as per appointment tab.

## 2020-05-04 NOTE — Progress Notes (Addendum)
Plan of care reviewed. Pt's hemodynamically stable. NSR on monitor. BP within normal limits. Remained afebrile. Pain on left leg tolerated well after Oxycodone given. Ambulated independently from bed to chair.  Dressing on left leg dry and intact. Absent left DP and PT pulses. MD aware.  Right leg with good signals on PT and DP pulses by doppler. No unexpected acute distress noted tonight. Will continue to monitor.  Kennyth Lose, RN

## 2020-05-04 NOTE — TOC Transition Note (Signed)
Transition of Care (TOC) - CM/SW Discharge Note [name] RN, BSN Transitions of Care Unit 4E- RN Case Manager See Treatment Team for direct phone #    Patient Details  Name: [name] [name] MRN: [id number] Date of Birth: 03-Apr-1961  Transition of Care [facility]) CM/SW Contact:  [name], RN Phone Number: 05/04/2020, 12:24 PM   Clinical Narrative:    Pt stable for transition home today, orders placed for [facility] and DME needs. CM in to speak with pt at bedside- pt agreeable to [facility] services- list provided Per CMS guidelines from medicare.gov website with star ratings (copy placed in shadow chart) for choice- per pt he does not have a preference and defers to CM to find an agency to provide services. Pt agreeable to use in house DME provider and have DME delivered to room prior to discharge.  Address, phone # and PCP all confirmed with pt in epic.  Per pt he is working on transportation home and should have a ride. If unable to find transportation, TOC will assist home.   Call made to [employer] [facility] line for referral -RW and 3n1- DME to be delivered to room   Call made to [facility] for HHPT/OT referral- [name] is able to accept referral and will f/u with pt at home for start of care visit  Final next level of care: [place] Barriers to Discharge: No Barriers Identified   Patient Goals and CMS Choice Patient states their goals for this hospitalization and ongoing recovery are:: be able to walk CMS Medicare.gov Compare Post Acute Care list provided to:: Patient Choice offered to / list presented to : Patient  Discharge Placement               Home with [facility]        Discharge Plan and Services   Discharge Planning Services: CM Consult Post Acute Care Choice: Home Health, Durable Medical Equipment          DME Arranged: 3-N-1, Walker rolling DME Agency: [employer] Date DME Agency Contacted: 05/04/20 Time DME Agency Contacted: 57 Representative spoke with at  DME Agency: [name]: PT, OT [name] Agency: [name] Date [name]: 05/04/20 Time [place]: 1100 Representative spoke with at [place]: [name] ([place]) Interventions     Readmission Risk Interventions Readmission Risk Prevention Plan 05/04/2020 02/27/2019  Post Dischage Appt Complete -  Medication Screening Complete -  Transportation Screening Complete Complete  PCP or Specialist Appt within 5-7 Days - Complete  Home Care Screening - Complete  Medication Review (RN CM) - Complete  Some recent data might be hidden

## 2020-05-04 NOTE — Evaluation (Signed)
Occupational Therapy Evaluation and Discharge Patient Details Name: [name] [name] MRN: [id number] DOB: 10/10/1960 Today's Date: 05/04/2020    History of Present Illness 59 y.o. male he is status post prior left femoral to popliteal bypass by Dr. Trula Slade on 02/26/2019. Presents to ED with 2-3 days pain in L foot. Admitted 11/03 with critical limb ischemia. Found to have occluded bypass. s/p 11/5 L LE angiogram, suction throbectomy and angioplasty popliteal and anterior tibila arteries 11/6 Reexposure of left below-knee popliteal, anterior tibial, tibioperoneal trunk and posterior tibial and peroneal arteries; Left lower extremity thromboembolectomy of bypass graft with stent, left anterior tibial artery; Endarterectomy of left popliteal, left tibioperoneal trunk and peroneal arteries with dacron patch angioplasty; 4 compartment fasciotomies left lower extremity    Clinical Impression   This 59 yo male admitted and underwent above presents to acute OT with PLOF of needing A due to recent leg surgery, but prior to that was Independent to Mod I. He currently is at a setup/S level for basic ADLs with min guard A stand/squat pivot. Pt reports he will have someone with him all the time that can help him prn. He says he has 3 steps to get into his house and I asked him how he was going to do this when he can't tolerate weight on his left foot and he said it won't be any problem--"my family will help me". No further OT needs, CM made aware via chat text of pt's [place] and DME needs. We will D/C from acute OT.     Follow Up Recommendations  No OT follow up (with 24 hour S/prn A initially)    Equipment Recommendations  3 in 1 bedside commode       Precautions / Restrictions Precautions Precautions: Fall Restrictions Weight Bearing Restrictions: No      Mobility Bed Mobility               General bed mobility comments: Pt up in recliner upon arrival    Transfers Overall transfer level: Needs  assistance Equipment used: Rolling walker (2 wheeled) Transfers: Sit to/from Stand Sit to Stand: Supervision              Balance Overall balance assessment: Needs assistance Sitting-balance support: Feet supported Sitting balance-Leahy Scale: Good     Standing balance support: Single extremity supported;During functional activity Standing balance-Leahy Scale: Poor Standing balance comment: He tried to take both hands off RW to adjust shorts, but was unable to due to pain--so had to have one hand off at at time and then had to sit to finish                           ADL either performed or assessed with clinical judgement   ADL Overall ADL's : Needs assistance/impaired Eating/Feeding: Independent;Sitting   Grooming: Set up;Sitting   Upper Body Bathing: Set up;Sitting   Lower Body Bathing: Set up;Supervison/ safety;Sit to/from stand   Upper Body Dressing : Set up;Sitting   Lower Body Dressing: Set up;Supervision/safety;Sit to/from stand   Toilet Transfer: Min guard;Stand-pivot;Squat-pivot   Toileting- Water quality scientist and Hygiene: Set up;Sit to/from stand               Vision Patient Visual Report: No change from baseline              Pertinent Vitals/Pain Pain Assessment: 0-10 Pain Score: 8  Pain Location: L foot in dependent position  Pain Descriptors / Indicators:  Burning;Pins and needles Pain Intervention(s): Limited activity within patient's tolerance;Monitored during session;Repositioned;Premedicated before session     Hand Dominance Left   Extremity/Trunk Assessment Upper Extremity Assessment Upper Extremity Assessment: Overall WFL for tasks assessed (Does have significant arthritis in his hands)           Communication Communication Communication: No difficulties   Cognition Arousal/Alertness: Awake/alert Behavior During Therapy: WFL for tasks assessed/performed Overall Cognitive Status: Within Functional Limits for  tasks assessed                                                Home Living Family/patient expects to be discharged to:: Private residence Living Arrangements: Other relatives;Non-relatives/Friends Available Help at Discharge: Family;Friend(s);Available 24 hours/day Type of Home: House Home Access: Stairs to enter [employer] of Steps: 3 Entrance Stairs-Rails: Right;Left Home Layout: One level     Bathroom Shower/Tub: Teacher, early years/pre: Standard Bathroom Accessibility: Yes   Home Equipment: Tub bench;Hand held shower head          Prior Functioning/Environment Level of Independence: Independent                 OT Problem List: Decreased range of motion;Impaired balance (sitting and/or standing);Pain         OT Goals(Current goals can be found in the care plan section) Acute Rehab OT Goals Patient Stated Goal: get back to hunting and fishing  OT Frequency:                AM-PAC OT "6 Clicks" Daily Activity     Outcome Measure Help from another person eating meals?: None Help from another person taking care of personal grooming?: A Little Help from another person toileting, which includes using toliet, bedpan, or urinal?: A Little Help from another person bathing (including washing, rinsing, drying)?: A Little Help from another person to put on and taking off regular upper body clothing?: A Little Help from another person to put on and taking off regular lower body clothing?: A Little 6 Click Score: 19   End of Session Equipment Utilized During Treatment: Rolling walker  Activity Tolerance: Patient limited by pain Patient left: in chair (where he was when I entered)  OT Visit Diagnosis: Unsteadiness on feet (R26.81);Other abnormalities of gait and mobility (R26.89);Pain Pain - Right/Left: Left Pain - part of body: Leg                Time: 1062-6948 OT Time Calculation (min): 14 min Charges:  OT General  Charges $OT Visit: 1 Visit OT Evaluation $OT Eval Moderate Complexity: 1 Mod  [place], OTR/L Acute [employer] Pager [phone] Office [phone]     Almon Register 05/04/2020, 9:16 AM

## 2020-05-08 ENCOUNTER — Other Ambulatory Visit: Payer: Self-pay

## 2020-05-08 ENCOUNTER — Inpatient Hospital Stay (HOSPITAL_COMMUNITY)
Admission: EM | Admit: 2020-05-08 | Discharge: 2020-05-14 | DRG: 241 | Disposition: A | Discharge status: Home Health | Payer: Medicare Other | Attending: Surgery | Admitting: Surgery

## 2020-05-08 ENCOUNTER — Encounter (HOSPITAL_COMMUNITY): Payer: Self-pay | Admitting: Vascular Surgery

## 2020-05-08 DIAGNOSIS — E785 Hyperlipidemia, unspecified: Secondary | ICD-10-CM | POA: Diagnosis not present

## 2020-05-08 DIAGNOSIS — Z79899 Other long term (current) drug therapy: Secondary | ICD-10-CM

## 2020-05-08 DIAGNOSIS — M549 Dorsalgia, unspecified: Secondary | ICD-10-CM | POA: Diagnosis not present

## 2020-05-08 DIAGNOSIS — I70229 Atherosclerosis of native arteries of extremities with rest pain, unspecified extremity: Secondary | ICD-10-CM

## 2020-05-08 DIAGNOSIS — I739 Peripheral vascular disease, unspecified: Secondary | ICD-10-CM | POA: Diagnosis present

## 2020-05-08 DIAGNOSIS — Z981 Arthrodesis status: Secondary | ICD-10-CM | POA: Diagnosis not present

## 2020-05-08 DIAGNOSIS — M7989 Other specified soft tissue disorders: Secondary | ICD-10-CM | POA: Diagnosis not present

## 2020-05-08 DIAGNOSIS — I70202 Unspecified atherosclerosis of native arteries of extremities, left leg: Secondary | ICD-10-CM | POA: Diagnosis not present

## 2020-05-08 DIAGNOSIS — Z9889 Other specified postprocedural states: Secondary | ICD-10-CM

## 2020-05-08 DIAGNOSIS — I70662 Atherosclerosis of nonbiological bypass graft(s) of the extremities with gangrene, left leg: Principal | ICD-10-CM | POA: Diagnosis present

## 2020-05-08 DIAGNOSIS — G8918 Other acute postprocedural pain: Secondary | ICD-10-CM | POA: Diagnosis not present

## 2020-05-08 DIAGNOSIS — Z7902 Long term (current) use of antithrombotics/antiplatelets: Secondary | ICD-10-CM | POA: Diagnosis not present

## 2020-05-08 DIAGNOSIS — Z8616 Personal history of COVID-19: Secondary | ICD-10-CM

## 2020-05-08 DIAGNOSIS — Z7952 Long term (current) use of systemic steroids: Secondary | ICD-10-CM | POA: Diagnosis not present

## 2020-05-08 DIAGNOSIS — K219 Gastro-esophageal reflux disease without esophagitis: Secondary | ICD-10-CM | POA: Diagnosis present

## 2020-05-08 DIAGNOSIS — F17210 Nicotine dependence, cigarettes, uncomplicated: Secondary | ICD-10-CM | POA: Diagnosis not present

## 2020-05-08 DIAGNOSIS — G4489 Other headache syndrome: Secondary | ICD-10-CM | POA: Diagnosis not present

## 2020-05-08 DIAGNOSIS — M79672 Pain in left foot: Secondary | ICD-10-CM | POA: Diagnosis not present

## 2020-05-08 DIAGNOSIS — L97529 Non-pressure chronic ulcer of other part of left foot with unspecified severity: Secondary | ICD-10-CM | POA: Diagnosis not present

## 2020-05-08 DIAGNOSIS — I70222 Atherosclerosis of native arteries of extremities with rest pain, left leg: Secondary | ICD-10-CM | POA: Diagnosis not present

## 2020-05-08 DIAGNOSIS — Z79891 Long term (current) use of opiate analgesic: Secondary | ICD-10-CM | POA: Diagnosis not present

## 2020-05-08 DIAGNOSIS — R531 Weakness: Secondary | ICD-10-CM | POA: Diagnosis not present

## 2020-05-08 DIAGNOSIS — I998 Other disorder of circulatory system: Secondary | ICD-10-CM

## 2020-05-08 DIAGNOSIS — M069 Rheumatoid arthritis, unspecified: Secondary | ICD-10-CM | POA: Diagnosis not present

## 2020-05-08 DIAGNOSIS — G8929 Other chronic pain: Secondary | ICD-10-CM | POA: Diagnosis present

## 2020-05-08 DIAGNOSIS — R0602 Shortness of breath: Secondary | ICD-10-CM | POA: Diagnosis not present

## 2020-05-08 DIAGNOSIS — Z7901 Long term (current) use of anticoagulants: Secondary | ICD-10-CM

## 2020-05-08 DIAGNOSIS — Z8249 Family history of ischemic heart disease and other diseases of the circulatory system: Secondary | ICD-10-CM

## 2020-05-08 DIAGNOSIS — M79609 Pain in unspecified limb: Secondary | ICD-10-CM

## 2020-05-08 DIAGNOSIS — I25110 Atherosclerotic heart disease of native coronary artery with unstable angina pectoris: Secondary | ICD-10-CM | POA: Diagnosis not present

## 2020-05-08 DIAGNOSIS — E876 Hypokalemia: Secondary | ICD-10-CM | POA: Diagnosis present

## 2020-05-08 DIAGNOSIS — I10 Essential (primary) hypertension: Secondary | ICD-10-CM | POA: Diagnosis present

## 2020-05-08 DIAGNOSIS — Z20822 Contact with and (suspected) exposure to covid-19: Secondary | ICD-10-CM | POA: Diagnosis present

## 2020-05-08 DIAGNOSIS — R0789 Other chest pain: Secondary | ICD-10-CM | POA: Diagnosis not present

## 2020-05-08 DIAGNOSIS — R1084 Generalized abdominal pain: Secondary | ICD-10-CM | POA: Diagnosis not present

## 2020-05-08 DIAGNOSIS — R079 Chest pain, unspecified: Secondary | ICD-10-CM | POA: Diagnosis not present

## 2020-05-08 LAB — RESPIRATORY PANEL BY RT PCR (FLU A&B, COVID)
Influenza A by PCR: NEGATIVE
Influenza B by PCR: NEGATIVE
SARS Coronavirus 2 by RT PCR: NEGATIVE

## 2020-05-08 LAB — CBC WITH DIFFERENTIAL/PLATELET
Abs Immature Granulocytes: 0.07 10*3/uL (ref 0.00–0.07)
Basophils Absolute: 0.1 10*3/uL (ref 0.0–0.1)
Basophils Relative: 0 %
Eosinophils Absolute: 0.2 10*3/uL (ref 0.0–0.5)
Eosinophils Relative: 2 %
HCT: 31 % — ABNORMAL LOW (ref 39.0–52.0)
Hemoglobin: 9.3 g/dL — ABNORMAL LOW (ref 13.0–17.0)
Immature Granulocytes: 1 %
Lymphocytes Relative: 11 %
Lymphs Abs: 1.5 10*3/uL (ref 0.7–4.0)
MCH: 24.2 pg — ABNORMAL LOW (ref 26.0–34.0)
MCHC: 30 g/dL (ref 30.0–36.0)
MCV: 80.7 fL (ref 80.0–100.0)
Monocytes Absolute: 1 10*3/uL (ref 0.1–1.0)
Monocytes Relative: 7 %
Neutro Abs: 10.9 10*3/uL — ABNORMAL HIGH (ref 1.7–7.7)
Neutrophils Relative %: 79 %
Platelets: 473 10*3/uL — ABNORMAL HIGH (ref 150–400)
RBC: 3.84 MIL/uL — ABNORMAL LOW (ref 4.22–5.81)
RDW: 16.4 % — ABNORMAL HIGH (ref 11.5–15.5)
WBC: 13.7 10*3/uL — ABNORMAL HIGH (ref 4.0–10.5)
nRBC: 0 % (ref 0.0–0.2)

## 2020-05-08 LAB — COMPREHENSIVE METABOLIC PANEL
ALT: 46 U/L — ABNORMAL HIGH (ref 0–44)
AST: 39 U/L (ref 15–41)
Albumin: 3.1 g/dL — ABNORMAL LOW (ref 3.5–5.0)
Alkaline Phosphatase: 186 U/L — ABNORMAL HIGH (ref 38–126)
Anion gap: 13 (ref 5–15)
BUN: 15 mg/dL (ref 6–20)
CO2: 26 mmol/L (ref 22–32)
Calcium: 9.3 mg/dL (ref 8.9–10.3)
Chloride: 98 mmol/L (ref 98–111)
Creatinine, Ser: 0.89 mg/dL (ref 0.61–1.24)
GFR, Estimated: >60 mL/min (ref >60)
Glucose, Bld: 129 mg/dL — ABNORMAL HIGH (ref 70–99)
Potassium: 2.9 mmol/L — ABNORMAL LOW (ref 3.5–5.1)
Sodium: 137 mmol/L (ref 135–145)
Total Bilirubin: 0.5 mg/dL (ref 0.3–1.2)
Total Protein: 6.8 g/dL (ref 6.5–8.1)

## 2020-05-08 MED ORDER — ONDANSETRON HCL 4 MG/2ML IJ SOLN
4.0000 mg | Freq: Four times a day (QID) | INTRAMUSCULAR | Status: DC | PRN
  Administered 2020-05-11: 4 mg via INTRAVENOUS

## 2020-05-08 MED ORDER — MORPHINE SULFATE (PF) 2 MG/ML IV SOLN
2.0000 mg | INTRAVENOUS | Status: DC | PRN
  Administered 2020-05-08 – 2020-05-11 (×6): 2 mg via INTRAVENOUS
  Filled 2020-05-08 (×2): qty 1
  Filled 2020-05-08: qty 2
  Filled 2020-05-08 (×3): qty 1

## 2020-05-08 MED ORDER — POTASSIUM CHLORIDE CRYS ER 10 MEQ PO TBCR
60.0000 meq | EXTENDED_RELEASE_TABLET | Freq: Once | ORAL | Status: AC
  Administered 2020-05-08: 60 meq via ORAL
  Filled 2020-05-08: qty 6

## 2020-05-08 MED ORDER — ACETAMINOPHEN 325 MG RE SUPP
325.0000 mg | RECTAL | Status: DC | PRN
  Filled 2020-05-08: qty 2

## 2020-05-08 MED ORDER — POTASSIUM CHLORIDE CRYS ER 20 MEQ PO TBCR: 20.0000 meq | EXTENDED_RELEASE_TABLET | Freq: Once | ORAL | Status: DC

## 2020-05-08 MED ORDER — GUAIFENESIN-DM 100-10 MG/5ML PO SYRP: 15.0000 mL | ORAL_SOLUTION | ORAL | Status: DC | PRN

## 2020-05-08 MED ORDER — IRBESARTAN 75 MG PO TABS
37.5000 mg | ORAL_TABLET | Freq: Every day | ORAL | Status: DC
  Administered 2020-05-08 – 2020-05-14 (×6): 37.5 mg via ORAL
  Filled 2020-05-08 (×10): qty 0.5

## 2020-05-08 MED ORDER — HYDRALAZINE HCL 20 MG/ML IJ SOLN: 5.0000 mg | INTRAMUSCULAR | Status: DC | PRN

## 2020-05-08 MED ORDER — CHLORTHALIDONE 25 MG PO TABS
25.0000 mg | ORAL_TABLET | Freq: Every day | ORAL | Status: DC
  Administered 2020-05-09 – 2020-05-14 (×5): 25 mg via ORAL
  Filled 2020-05-08 (×6): qty 1

## 2020-05-08 MED ORDER — ALUM & MAG HYDROXIDE-SIMETH 200-200-20 MG/5ML PO SUSP: 15.0000 mL | ORAL | Status: DC | PRN

## 2020-05-08 MED ORDER — FENTANYL CITRATE (PF) 100 MCG/2ML IJ SOLN
50.0000 ug | Freq: Once | INTRAMUSCULAR | Status: AC
  Administered 2020-05-08: 50 ug via INTRAVENOUS
  Filled 2020-05-08: qty 2

## 2020-05-08 MED ORDER — ASCORBIC ACID 500 MG PO TABS
500.0000 mg | ORAL_TABLET | ORAL | Status: DC
  Administered 2020-05-09 – 2020-05-13 (×2): 500 mg via ORAL
  Filled 2020-05-08 (×3): qty 1

## 2020-05-08 MED ORDER — METOPROLOL TARTRATE 5 MG/5ML IV SOLN: 2.0000 mg | INTRAVENOUS | Status: DC | PRN

## 2020-05-08 MED ORDER — PANTOPRAZOLE SODIUM 40 MG PO TBEC
40.0000 mg | DELAYED_RELEASE_TABLET | Freq: Every day | ORAL | Status: DC
  Administered 2020-05-08 – 2020-05-14 (×6): 40 mg via ORAL
  Filled 2020-05-08 (×6): qty 1

## 2020-05-08 MED ORDER — OXYCODONE HCL 5 MG PO TABS
5.0000 mg | ORAL_TABLET | ORAL | Status: DC | PRN
  Administered 2020-05-08 – 2020-05-14 (×28): 10 mg via ORAL
  Filled 2020-05-08 (×31): qty 2

## 2020-05-08 MED ORDER — SODIUM CHLORIDE 0.9% FLUSH: 3.0000 mL | INTRAVENOUS | Status: DC | PRN

## 2020-05-08 MED ORDER — PHENOL 1.4 % MT LIQD
1.0000 | OROMUCOSAL | Status: DC | PRN
  Filled 2020-05-08: qty 177

## 2020-05-08 MED ORDER — DOCUSATE SODIUM 100 MG PO CAPS
100.0000 mg | ORAL_CAPSULE | Freq: Two times a day (BID) | ORAL | Status: DC
  Administered 2020-05-08 – 2020-05-13 (×8): 100 mg via ORAL
  Filled 2020-05-08 (×11): qty 1

## 2020-05-08 MED ORDER — SODIUM CHLORIDE 0.9% FLUSH
3.0000 mL | Freq: Two times a day (BID) | INTRAVENOUS | Status: DC
  Administered 2020-05-08 – 2020-05-13 (×9): 3 mL via INTRAVENOUS

## 2020-05-08 MED ORDER — SODIUM CHLORIDE 0.9 % IV SOLN: INTRAVENOUS | Status: DC

## 2020-05-08 MED ORDER — SODIUM CHLORIDE 0.9 % IV SOLN: 250.0000 mL | INTRAVENOUS | Status: DC | PRN

## 2020-05-08 MED ORDER — ATORVASTATIN CALCIUM 80 MG PO TABS
80.0000 mg | ORAL_TABLET | Freq: Every day | ORAL | Status: DC
  Administered 2020-05-08 – 2020-05-13 (×6): 80 mg via ORAL
  Filled 2020-05-08 (×6): qty 1

## 2020-05-08 MED ORDER — GABAPENTIN 100 MG PO CAPS
100.0000 mg | ORAL_CAPSULE | Freq: Every evening | ORAL | Status: DC | PRN
  Administered 2020-05-09 – 2020-05-13 (×3): 100 mg via ORAL
  Filled 2020-05-08 (×3): qty 1

## 2020-05-08 MED ORDER — AMLODIPINE BESYLATE 10 MG PO TABS
10.0000 mg | ORAL_TABLET | Freq: Every day | ORAL | Status: DC
  Administered 2020-05-09 – 2020-05-14 (×5): 10 mg via ORAL
  Filled 2020-05-08: qty 1
  Filled 2020-05-08: qty 2
  Filled 2020-05-08: qty 1
  Filled 2020-05-08: qty 2
  Filled 2020-05-08: qty 1

## 2020-05-08 MED ORDER — ACETAMINOPHEN 325 MG PO TABS
325.0000 mg | ORAL_TABLET | ORAL | Status: DC | PRN
  Administered 2020-05-10 (×6): 650 mg via ORAL
  Filled 2020-05-08 (×7): qty 2

## 2020-05-08 MED ORDER — LABETALOL HCL 5 MG/ML IV SOLN: 10.0000 mg | INTRAVENOUS | Status: DC | PRN

## 2020-05-08 NOTE — ED Notes (Signed)
Pt resting in bed. Reporting persistent foot pain. Pt left foot remains w/o a doppler DP and PT pulse.

## 2020-05-08 NOTE — ED Notes (Signed)
Report to lisa rn

## 2020-05-08 NOTE — ED Provider Notes (Signed)
[facility] EMERGENCY DEPARTMENT Provider Note   CSN: [id number] Arrival date & time: 05/08/20  1156     History Chief Complaint  Patient presents with  . Foot Injury    GIANNIS CORPUZ is a 59 y.o. male.  59 year old male presents with increased pain to his left foot.  Recent vascular surgery several days ago due to an occlusion.  States that since yesterday he has noted increasing sharp pain to his left foot as well as discoloration to his right great toe and some erythema around his prior incision sites.  Pain is severe with walking.  Denies any fever, emesis.  No no history of trauma.  No new treatments used prior to arrival        Past Medical History:  Diagnosis Date  . Anemia   . Anemia 08/06/2012  . 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 Community acquired pneumonia 11/07/2011  . DDD (degenerative disc disease), lumbosacral     and grade 2 slip  . GERD (gastroesophageal reflux disease)   . Heart murmur   . History of anemia    no current med.  . Hypertension    states is borderline on medication; has been on med. x 5-6 yr.  . Hypokalemia 05/30/2018  . Impingement syndrome of shoulder region 10/2014   left  . Insomnia 06/01/2015  . Ischemic ulcer of toe of left foot ([place])   . Left shoulder pain 12/16/2015  . Low back pain without sciatica 10/29/2009  . Lupus ([place])   . Peripheral vascular disease ([place])   . Pneumonia   . RA (rheumatoid arthritis) ([place])   . Rotator cuff tear 11/04/2014    Patient Active Problem List   Diagnosis Date Noted  . Critical limb ischemia with history of revascularization of same extremity ([place]) 04/28/2020  . Limb ischemia 01/23/2020  . Ischemic leg 01/22/2020  . History of COVID-19 05/10/2019  . PAD (peripheral artery disease) ([place])   . History of critical lower limb ischemia 02/19/2019  . S/P lumbar fusion, Lower back pain 01/03/2018  . Coronary artery disease involving native coronary artery of native heart with unstable  angina pectoris ([place])   . Preventative health care 03/13/2017  . Transaminitis 12/16/2015  . Bilateral shoulder pain 08/20/2012  . Tobacco use disorder 08/20/2012  . HTN (hypertension) 04/20/2011  . HLD (hyperlipidemia) 10/08/2009  . Rheumatoid arthritis ([place]) 08/11/2009    Past Surgical History:  Procedure Laterality Date  . ABDOMINAL AORTAGRAM  02/20/2019  . ABDOMINAL AORTOGRAM W/LOWER EXTREMITY N/A 02/20/2019   Procedure: ABDOMINAL AORTOGRAM W/LOWER EXTREMITY;  Surgeon: Marty Heck, MD;  Location: [facility];  Service: Cardiovascular;  Laterality: N/A;  . ACHILLES TENDON SURGERY Bilateral   . ENDARTERECTOMY POPLITEAL Left 05/01/2020   Procedure: ENDARTERECTOMY POPLITEAL;  Surgeon: Waynetta Sandy, MD;  Location: [facility];  Service: Vascular;  Laterality: Left;  . FASCIOTOMY Left 05/01/2020   Procedure: FASCIOTOMY;  Surgeon: Waynetta Sandy, MD;  Location: [facility];  Service: Vascular;  Laterality: Left;  . FEMORAL-POPLITEAL BYPASS GRAFT Left 02/26/2019   Procedure: BYPASS GRAFT FEMORAL-POPLITEAL ARTERY LEFT LEG USING 80mm PROPATEN GRAFT;  Surgeon: Serafina Mitchell, MD;  Location: [facility];  Service: Vascular;  Laterality: Left;  . INTRAOPERATIVE ARTERIOGRAM Left 01/22/2020   Procedure: INTRA OPERATIVE ARTERIOGRAM with administration of thrombolyics in Left femoral to popliteal bypass.;  Surgeon: Marty Heck, MD;  Location: [facility];  Service: Vascular;  Laterality: Left;  . LEFT HEART CATH AND CORONARY  ANGIOGRAPHY N/A 03/15/2017   Procedure: LEFT HEART CATH AND CORONARY ANGIOGRAPHY;  Surgeon: Nelva Bush, MD;  Location: [facility];  Service: Cardiovascular;  Laterality: N/A;  . LOWER EXTREMITY INTERVENTION Left 04/29/2020   Procedure: LOWER EXTREMITY INTERVENTION- LYSIS;  Surgeon: Marty Heck, MD;  Location: [facility];  Service: Cardiovascular;  Laterality: Left;  . OSTEOTOMY AND ULNAR SHORTENING Right 07/22/2002  . PATCH  ANGIOPLASTY Left 05/01/2020   Procedure: [place];  Surgeon: Waynetta Sandy, MD;  Location: [facility];  Service: Vascular;  Laterality: Left;  . PERIPHERAL VASCULAR ATHERECTOMY  01/23/2020   Procedure: PERIPHERAL VASCULAR ATHERECTOMY;  Surgeon: Marty Heck, MD;  Location: [facility];  Service: Cardiovascular;;  . PERIPHERAL VASCULAR BALLOON ANGIOPLASTY  01/23/2020   Procedure: PERIPHERAL VASCULAR BALLOON ANGIOPLASTY;  Surgeon: Marty Heck, MD;  Location: [facility];  Service: Cardiovascular;;  . PERIPHERAL VASCULAR INTERVENTION Left 04/30/2020   Procedure: PERIPHERAL VASCULAR INTERVENTION;  Surgeon: Cherre Robins, MD;  Location: [facility];  Service: Cardiovascular;  Laterality: Left;  . PERIPHERAL VASCULAR THROMBECTOMY N/A 01/23/2020   Procedure: lysis recheck;  Surgeon: Marty Heck, MD;  Location: [facility];  Service: Cardiovascular;  Laterality: N/A;  + [place]   . PERIPHERAL VASCULAR THROMBECTOMY N/A 04/30/2020   Procedure: Lysis Recheck;  Surgeon: Cherre Robins, MD;  Location: [facility];  Service: Cardiovascular;  Laterality: N/A;  . SHOULDER ARTHROSCOPY WITH BICEPSTENOTOMY Right 03/11/2015   Procedure: SHOULDER ARTHROSCOPY WITH BICEPSTENOTOMY;  Surgeon: Ninetta Lights, MD;  Location: [facility];  Service: Orthopedics;  Laterality: Right;  . SHOULDER ARTHROSCOPY WITH DISTAL CLAVICLE RESECTION Left 11/19/2014   Procedure: SHOULDER ARTHROSCOPY WITH DISTAL CLAVICLE RESECTION;  Surgeon: Kathryne Hitch, MD;  Location: [facility];  Service: Orthopedics;  Laterality: Left;  . SHOULDER ARTHROSCOPY WITH ROTATOR CUFF REPAIR AND SUBACROMIAL DECOMPRESSION Left 11/19/2014   Procedure: LEFT SHOULDER ARTHROSCOPY, DEBRIDEMENT DISTAL CLAVICLE EXCISION, ACROMIOPLASTY WITH ROTATOR CUFF REPAIR ;  Surgeon: Kathryne Hitch, MD;  Location: [facility];   Service: Orthopedics;  Laterality: Left;  . THROMBECTOMY OF BYPASS GRAFT FEMORAL- POPLITEAL ARTERY Left 05/01/2020   Procedure: THROMBECTOMY OF LOWER EXTREMITY;  Surgeon: Waynetta Sandy, MD;  Location: [facility];  Service: Vascular;  Laterality: Left;  . TRANSFORAMINAL LUMBAR INTERBODY FUSION (TLIF) WITH PEDICLE SCREW FIXATION 1 LEVEL N/A 01/03/2018   Procedure: TRANSFORAMINAL LUMBAR INTERBODY FUSION (TLIF) LUMBAR FIVE-SACRAL ONE;  Surgeon: Melina Schools, MD;  Location: [facility];  Service: Orthopedics;  Laterality: N/A;  . ULTRASOUND GUIDANCE FOR VASCULAR ACCESS Right 01/22/2020   Procedure: ULTRASOUND GUIDANCE FOR VASCULAR ACCESS Right Common Femoral Artery.;  Surgeon: Marty Heck, MD;  Location: [facility];  Service: Vascular;  Laterality: Right;  [name] VIDEO ASSISTED THORACOSCOPY (VATS)/DECORTICATION Right 11/21/2011   drainage of empyema       Family History  Problem Relation Age of Onset  . Heart attack Father 68  . Alcohol abuse Father   . Aneurysm Mother   . Stroke Neg Hx   . Cancer Neg Hx     Social History   Tobacco Use  . Smoking status: Current Every Day Smoker    Packs/day: 0.25    Years: 20.00    Pack years: 5.00    Types: Cigarettes    Last attempt to quit: 02/19/2019    Years since quitting: 1.2  . Smokeless tobacco: Never Used  .  Tobacco comment: 1-2 cigs per day; trying to quit  Vaping Use  . Vaping Use: Never used  Substance Use Topics  . Alcohol use: No    Alcohol/week: 0.0 standard drinks    Comment: sober 1998  . Drug use: No    Home Medications Prior to Admission medications   Medication Sig Start Date End Date Taking? Authorizing Provider  amLODipine (NORVASC) 10 MG tablet Take 1 tablet (10 mg total) by mouth daily. 01/27/20   Jose Persia, MD  apixaban (ELIQUIS) 5 MG TABS tablet Take 1 tablet (5 mg total) by mouth 2 (two) times daily. 01/24/20   Baglia, Corrina, PA-C  ascorbic acid (VITAMIN C) 500 MG tablet Take 500 mg by mouth every other day.      [provider]  atorvastatin (LIPITOR) 80 MG tablet Take 1 tablet (80 mg total) by mouth daily at 6 PM. 11/06/19   Oda Kilts, MD  chlorthalidone (HYGROTON) 25 MG tablet Take 1 tablet (25 mg total) by mouth daily. 01/08/20   Harvie Heck, MD  clopidogrel (PLAVIX) 75 MG tablet Take 1 tablet (75 mg total) by mouth daily. 11/06/19 11/05/20  Oda Kilts, MD  docusate sodium (COLACE) 100 MG capsule Take 1 capsule (100 mg total) by mouth daily. Patient taking differently: Take 100 mg by mouth daily as needed for mild constipation.  02/27/19   Santos-Sanchez, Merlene Morse, MD  gabapentin (NEURONTIN) 100 MG capsule Take 100 mg by mouth at bedtime as needed (for pain).  12/31/19   [provider]  Glycerin-Polysorbate 80 (REFRESH DRY EYE THERAPY OP) Place 1 drop into both eyes 3 (three) times daily as needed (for dryness or irritation).     [provider]  ibuprofen (ADVIL) 200 MG tablet Take 200-600 mg by mouth every 6 (six) hours as needed for headache or mild pain.    [provider]  naloxone [facility]) 2 MG/2ML injection Inject 2 mg into the muscle once as needed (opiod overdose (call 911, inject intramuscularly in shoulder or thigh. Repeat every 3 minutes)).     [provider]  oxyCODONE-acetaminophen (PERCOCET) 10-325 MG tablet Take 1 tablet by mouth 4 (four) times daily as needed for pain.  04/15/19   [provider]  predniSONE (DELTASONE) 5 MG tablet TAKE 1 TABLET BY MOUTH EVERY DAY WITH BREAKFAST Patient taking differently: Take 5 mg by mouth daily.  01/22/20   Mosetta Anis, MD  valsartan (DIOVAN) 40 MG tablet Take 40 mg by mouth daily. 02/03/20   [provider]    Allergies    Morphine and related, Ramipril, and Tramadol  Review of Systems   Review of Systems  All other systems reviewed and are negative.   Physical Exam Updated Vital Signs BP 108/71   Pulse 96   Temp 98.2 F (36.8 C)   Resp 18   Ht 1.727 m (5\' 8" )    Wt 79.4 kg   SpO2 98%   BMI 26.61 kg/m   Physical Exam Vitals and nursing note reviewed.  Constitutional:      General: He is not in acute distress.    Appearance: Normal appearance. He is well-developed. He is not toxic-appearing.  HENT:     Head: Normocephalic and atraumatic.  Eyes:     General: Lids are normal.     Conjunctiva/sclera: Conjunctivae normal.     Pupils: Pupils are equal, round, and reactive to light.  Neck:     Thyroid: No thyroid mass.  Trachea: No tracheal deviation.  Cardiovascular:     Rate and Rhythm: Normal rate and regular rhythm.     Heart sounds: Normal heart sounds. No murmur heard.  No gallop.   Pulmonary:     Effort: Pulmonary effort is normal. No respiratory distress.     Breath sounds: Normal breath sounds. No stridor. No decreased breath sounds, wheezing, rhonchi or rales.  Abdominal:     General: Bowel sounds are normal. There is no distension.     Palpations: Abdomen is soft.     Tenderness: There is no abdominal tenderness. There is no rebound.  Musculoskeletal:        General: No tenderness. Normal range of motion.     Cervical back: Normal range of motion and neck supple.       Legs:  Skin:    General: Skin is warm and dry.     Findings: No abrasion or rash.  Neurological:     Mental Status: He is alert and oriented to person, place, and time.     GCS: GCS eye subscore is 4. GCS verbal subscore is 5. GCS motor subscore is 6.     Cranial Nerves: No cranial nerve deficit.     Sensory: No sensory deficit.  Psychiatric:        Speech: Speech normal.        Behavior: Behavior normal.     ED Results / Procedures / Treatments   Labs (all labs ordered are listed, but only abnormal results are displayed) Labs Reviewed  RESPIRATORY PANEL BY RT PCR (FLU A&B, COVID)  CBC WITH DIFFERENTIAL/PLATELET  COMPREHENSIVE METABOLIC PANEL    EKG EKG Interpretation  Date/Time:  Saturday May 08 2020 12:14:12 EST Ventricular Rate:    95 PR Interval:    QRS Duration: 105 QT Interval:  347 QTC Calculation: 437 R Axis:   24 Text Interpretation: Sinus rhythm No significant change since last tracing Confirmed by Lacretia Leigh ([id number]) on 05/08/2020 12:16:35 PM   Radiology No results found.  Procedures Procedures (including critical care time)  Medications Ordered in ED Medications  0.9 %  sodium chloride infusion (has no administration in time range)  fentaNYL (SUBLIMAZE) injection 50 mcg (has no administration in time range)    ED Course  I have reviewed the triage vital signs and the nursing notes.  Pertinent labs & imaging results that were available during my care of the patient were reviewed by me and considered in my medical decision making (see chart for details).    MDM Rules/Calculators/A&P                         Patient is on Eliquis and states compliance. Discussed with Dr. Oneida Alar on-call for vascular surgeon.  He will come and see the patient Final Clinical Impression(s) / ED Diagnoses Final diagnoses:  None    Rx / DC Orders ED Discharge Orders    None       Lacretia Leigh, MD 05/08/20 1221

## 2020-05-08 NOTE — H&P (Signed)
Patient name: [name] [name] MRN: [id number] DOB: 1960-11-14 Sex: male  HPI: [name] [name] is a 59 y.o. male, s/p multiple revascularizations discharged from hospital a few days ago.  At that time he was deemed to not be a candidate for further revasc procedures.  He was dc'd home with a known bypass occlusion with a plan for AKA if worsening pain.  He has had increasing pain since d/c and now wants amputation.  He is on Eliquis.  His last dose was this morning.  Past Medical History:  Diagnosis Date  . Anemia   . Anemia 08/06/2012  . 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 Community acquired pneumonia 11/07/2011  . DDD (degenerative disc disease), lumbosacral     and grade 2 slip  . GERD (gastroesophageal reflux disease)   . Heart murmur   . History of anemia    no current med.  . Hypertension    states is borderline on medication; has been on med. x 5-6 yr.  . Hypokalemia 05/30/2018  . Impingement syndrome of shoulder region 10/2014   left  . Insomnia 06/01/2015  . Ischemic ulcer of toe of left foot ([place])   . Left shoulder pain 12/16/2015  . Low back pain without sciatica 10/29/2009  . Lupus ([place])   . Peripheral vascular disease ([place])   . Pneumonia   . RA (rheumatoid arthritis) ([place])   . Rotator cuff tear 11/04/2014   Past Surgical History:  Procedure Laterality Date  . ABDOMINAL AORTAGRAM  02/20/2019  . ABDOMINAL AORTOGRAM W/LOWER EXTREMITY N/A 02/20/2019   Procedure: ABDOMINAL AORTOGRAM W/LOWER EXTREMITY;  Surgeon: Marty Heck, MD;  Location: [facility];  Service: Cardiovascular;  Laterality: N/A;  . ACHILLES TENDON SURGERY Bilateral   . ENDARTERECTOMY POPLITEAL Left 05/01/2020   Procedure: ENDARTERECTOMY POPLITEAL;  Surgeon: Waynetta Sandy, MD;  Location: [facility];  Service: Vascular;  Laterality: Left;  . FASCIOTOMY Left 05/01/2020   Procedure: FASCIOTOMY;  Surgeon: Waynetta Sandy, MD;  Location: [facility];  Service: Vascular;  Laterality: Left;  .  FEMORAL-POPLITEAL BYPASS GRAFT Left 02/26/2019   Procedure: BYPASS GRAFT FEMORAL-POPLITEAL ARTERY LEFT LEG USING 15mm PROPATEN GRAFT;  Surgeon: Serafina Mitchell, MD;  Location: [facility];  Service: Vascular;  Laterality: Left;  . INTRAOPERATIVE ARTERIOGRAM Left 01/22/2020   Procedure: INTRA OPERATIVE ARTERIOGRAM with administration of thrombolyics in Left femoral to popliteal bypass.;  Surgeon: Marty Heck, MD;  Location: [facility];  Service: Vascular;  Laterality: Left;  . LEFT HEART CATH AND CORONARY ANGIOGRAPHY N/A 03/15/2017   Procedure: LEFT HEART CATH AND CORONARY ANGIOGRAPHY;  Surgeon: Nelva Bush, MD;  Location: [facility];  Service: Cardiovascular;  Laterality: N/A;  . LOWER EXTREMITY INTERVENTION Left 04/29/2020   Procedure: LOWER EXTREMITY INTERVENTION- LYSIS;  Surgeon: Marty Heck, MD;  Location: [facility];  Service: Cardiovascular;  Laterality: Left;  . OSTEOTOMY AND ULNAR SHORTENING Right 07/22/2002  . PATCH ANGIOPLASTY Left 05/01/2020   Procedure: [place];  Surgeon: Waynetta Sandy, MD;  Location: [facility];  Service: Vascular;  Laterality: Left;  . PERIPHERAL VASCULAR ATHERECTOMY  01/23/2020   Procedure: PERIPHERAL VASCULAR ATHERECTOMY;  Surgeon: Marty Heck, MD;  Location: [facility];  Service: Cardiovascular;;  . PERIPHERAL VASCULAR BALLOON ANGIOPLASTY  01/23/2020   Procedure: PERIPHERAL VASCULAR BALLOON ANGIOPLASTY;  Surgeon: Marty Heck, MD;  Location: [facility];  Service: Cardiovascular;;  . PERIPHERAL VASCULAR INTERVENTION Left  04/30/2020   Procedure: PERIPHERAL VASCULAR INTERVENTION;  Surgeon: Cherre Robins, MD;  Location: [facility];  Service: Cardiovascular;  Laterality: Left;  . PERIPHERAL VASCULAR THROMBECTOMY N/A 01/23/2020   Procedure: lysis recheck;  Surgeon: Marty Heck, MD;  Location: [facility];  Service: Cardiovascular;   Laterality: N/A;  + [place]   . PERIPHERAL VASCULAR THROMBECTOMY N/A 04/30/2020   Procedure: Lysis Recheck;  Surgeon: Cherre Robins, MD;  Location: [facility];  Service: Cardiovascular;  Laterality: N/A;  . SHOULDER ARTHROSCOPY WITH BICEPSTENOTOMY Right 03/11/2015   Procedure: SHOULDER ARTHROSCOPY WITH BICEPSTENOTOMY;  Surgeon: Ninetta Lights, MD;  Location: [facility];  Service: Orthopedics;  Laterality: Right;  . SHOULDER ARTHROSCOPY WITH DISTAL CLAVICLE RESECTION Left 11/19/2014   Procedure: SHOULDER ARTHROSCOPY WITH DISTAL CLAVICLE RESECTION;  Surgeon: Kathryne Hitch, MD;  Location: [facility];  Service: Orthopedics;  Laterality: Left;  . SHOULDER ARTHROSCOPY WITH ROTATOR CUFF REPAIR AND SUBACROMIAL DECOMPRESSION Left 11/19/2014   Procedure: LEFT SHOULDER ARTHROSCOPY, DEBRIDEMENT DISTAL CLAVICLE EXCISION, ACROMIOPLASTY WITH ROTATOR CUFF REPAIR ;  Surgeon: Kathryne Hitch, MD;  Location: [facility];  Service: Orthopedics;  Laterality: Left;  . THROMBECTOMY OF BYPASS GRAFT FEMORAL- POPLITEAL ARTERY Left 05/01/2020   Procedure: THROMBECTOMY OF LOWER EXTREMITY;  Surgeon: Waynetta Sandy, MD;  Location: [facility];  Service: Vascular;  Laterality: Left;  . TRANSFORAMINAL LUMBAR INTERBODY FUSION (TLIF) WITH PEDICLE SCREW FIXATION 1 LEVEL N/A 01/03/2018   Procedure: TRANSFORAMINAL LUMBAR INTERBODY FUSION (TLIF) LUMBAR FIVE-SACRAL ONE;  Surgeon: Melina Schools, MD;  Location: [facility];  Service: Orthopedics;  Laterality: N/A;  . ULTRASOUND GUIDANCE FOR VASCULAR ACCESS Right 01/22/2020   Procedure: ULTRASOUND GUIDANCE FOR VASCULAR ACCESS Right Common Femoral Artery.;  Surgeon: Marty Heck, MD;  Location: [facility];  Service: Vascular;  Laterality: Right;  [name] VIDEO ASSISTED THORACOSCOPY (VATS)/DECORTICATION Right 11/21/2011   drainage of empyema    Family History  Problem Relation Age of Onset  . Heart attack Father 75  . Alcohol abuse Father   .  Aneurysm Mother   . Stroke Neg Hx   . Cancer Neg Hx     SOCIAL HISTORY: Social History   Socioeconomic History  . Marital status: Single    Spouse name: Not on file  . Number of children: 0  . Years of education: Not on file  . Highest education level: Not on file  Occupational History  . Occupation: Disabled    Comment: 2/2 RA  Tobacco Use  . Smoking status: Current Every Day Smoker    Packs/day: 0.25    Years: 20.00    Pack years: 5.00    Types: Cigarettes    Last attempt to quit: 02/19/2019    Years since quitting: 1.2  . Smokeless tobacco: Never Used  . Tobacco comment: 1-2 cigs per day; trying to quit  Vaping Use  . Vaping Use: Never used  Substance and Sexual Activity  . Alcohol use: No    Alcohol/week: 0.0 standard drinks    Comment: sober 1998  . Drug use: No  . Sexual activity: Not on file  Other Topics Concern  . Not on file  Social History Narrative   On disability since 1992, used to work with city of [place]. Quit drinking in 1990.      Current Social History 11/05/2019        Patient lives by himself most of the time. Sometimes girlfriend's son stays with him in a  one level home. There are 3 steps with handrail up to the entrance the patient uses.       Patient's method of transportation is personal truck. This was recently vandalized and patient doesn't have funds to repair at this time.      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, BSN, RN-BC    Social Determinants of Health   Financial Resource Strain:   . Difficulty of  Paying Living Expenses: Not on file  Food Insecurity:   . Worried About Charity fundraiser in the Last Year: Not on file  . Ran Out of Food in the Last Year: Not on file  Transportation Needs:   . Lack of Transportation (Medical): Not on file  . Lack of Transportation (Non-Medical): Not on file  Physical Activity:   . Days of Exercise per Week: Not on file  . Minutes of Exercise per Session: Not on file  Stress:   . Feeling of Stress : Not on file  Social Connections:   . Frequency of Communication with Friends and Family: Not on file  . Frequency of Social Gatherings with Friends and Family: Not on file  . Attends Religious Services: Not on file  . Active Member of Clubs or Organizations: Not on file  . Attends Archivist Meetings: Not on file  . Marital Status: Not on file  Intimate Partner Violence:   . Fear of Current or Ex-Partner: Not on file  . Emotionally Abused: Not on file  . Physically Abused: Not on file  . Sexually Abused: Not on file    Allergies  Allergen Reactions  . Morphine And Related Rash  . Ramipril Rash and Other (See Comments)    Per patient on R arm and leg only; no angioedema  . Tramadol Itching and Rash    Current Facility-Administered Medications  Medication Dose Route Frequency Provider Last Rate Last Admin  . 0.9 %  sodium chloride infusion   Intravenous Continuous Lacretia Leigh, MD 10 mL/hr at 05/08/20 1240 New Bag at 05/08/20 1240   Current Outpatient Medications  Medication Sig Dispense Refill  . amLODipine (NORVASC) 10 MG tablet Take 1 tablet (10 mg total) by mouth daily. 90 tablet 1  . apixaban (ELIQUIS) 5 MG TABS tablet Take 1 tablet (5 mg total) by mouth 2 (two) times daily. 60 tablet 3  . ascorbic acid (VITAMIN C) 500 MG tablet Take 500 mg by mouth every other day.     [name] atorvastatin (LIPITOR) 80 MG tablet Take 1 tablet (80 mg total) by mouth daily at 6 PM. 30 tablet 11  . chlorthalidone (HYGROTON) 25 MG tablet Take 1 tablet  (25 mg total) by mouth daily. 30 tablet 5  . clopidogrel (PLAVIX) 75 MG tablet Take 1 tablet (75 mg total) by mouth daily. 30 tablet 11  . docusate sodium (COLACE) 100 MG capsule Take 1 capsule (100 mg total) by  mouth daily. (Patient taking differently: Take 100 mg by mouth daily as needed for mild constipation. ) 10 capsule 0  . gabapentin (NEURONTIN) 100 MG capsule Take 100 mg by mouth at bedtime as needed (for pain).     . Glycerin-Polysorbate 80 (REFRESH DRY EYE THERAPY OP) Place 1 drop into both eyes 3 (three) times daily as needed (for dryness or irritation).     [name] ibuprofen (ADVIL) 200 MG tablet Take 200-600 mg by mouth every 6 (six) hours as needed for headache or mild pain.    . naloxone (NARCAN) 2 MG/2ML injection Inject 2 mg into the muscle once as needed (opiod overdose (call 911, inject intramuscularly in shoulder or thigh. Repeat every 3 minutes)).     [name] oxyCODONE-acetaminophen (PERCOCET) 10-325 MG tablet Take 1 tablet by mouth 4 (four) times daily as needed for pain.     . predniSONE (DELTASONE) 5 MG tablet TAKE 1 TABLET BY MOUTH EVERY DAY WITH BREAKFAST (Patient taking differently: Take 5 mg by mouth daily. ) 30 tablet 5  . valsartan (DIOVAN) 40 MG tablet Take 40 mg by mouth daily.      ROS:   Cardiac: No recent episodes of chest pain/pressure, no shortness of breath at rest.  No shortness of breath with exertion.  Denies history of atrial fibrillation or irregular heartbeat  Pulmonary: No home oxygen, no productive cough, no hemoptysis,  No asthma or wheezing   Physical Examination  Vitals:   05/08/20 1208 05/08/20 1209  BP: 108/71   Pulse: 96   Resp: 18   Temp: 98.2 F (36.8 C)   SpO2: 98%   Weight:  79.4 kg  Height:  5\' 8"  (1.727 m)    Body mass index is 26.61 kg/m.  General:  Alert and oriented, no acute distress HEENT: Normal Neck: No JVD Pulmonary: Clear to auscultation bilaterally Cardiac: Regular Rate and Rhythm Abdomen: Soft, non-tender,  non-distended Skin: No rash, bluish appearing left foot, healing right medial and lateral leg incisions Extremity Pulses:  2+ radial, brachial, femoral, absent dorsalis pedis, posterior tibial pulses left foot Musculoskeletal: No deformity or edema  Neurologic: Upper and lower extremity motor 5/5 and symmetric  DATA:  CBC    Component Value Date/Time   WBC 13.7 (H) 05/08/2020 1210   RBC 3.84 (L) 05/08/2020 1210   HGB 9.3 (L) 05/08/2020 1210   HGB 13.9 03/13/2017 1032   HCT 31.0 (L) 05/08/2020 1210   HCT 41.6 03/13/2017 1032   PLT 473 (H) 05/08/2020 1210   PLT 204 03/13/2017 1032   MCV 80.7 05/08/2020 1210   MCV 84 03/13/2017 1032   MCH 24.2 (L) 05/08/2020 1210   MCHC 30.0 05/08/2020 1210   RDW 16.4 (H) 05/08/2020 1210   RDW 13.7 03/13/2017 1032   LYMPHSABS 1.5 05/08/2020 1210   LYMPHSABS 1.8 03/13/2017 1032   MONOABS 1.0 05/08/2020 1210   EOSABS 0.2 05/08/2020 1210   EOSABS 0.2 03/13/2017 1032   BASOSABS 0.1 05/08/2020 1210   BASOSABS 0.0 03/13/2017 1032    BMET    Component Value Date/Time   NA 137 05/08/2020 1210   NA 140 05/30/2018 1413   K 2.9 (L) 05/08/2020 1210   CL 98 05/08/2020 1210   CO2 26 05/08/2020 1210   GLUCOSE 129 (H) 05/08/2020 1210   BUN 15 05/08/2020 1210   BUN 9 05/30/2018 1413   CREATININE 0.89 05/08/2020 1210   CREATININE 0.80 11/27/2012 0956   CALCIUM 9.3 05/08/2020 1210   GFRNONAA >60 05/08/2020 1210  GFRNONAA >89 11/27/2012 0956   GFRAA >60 01/24/2020 0202   GFRAA >89 11/27/2012 0956   ASSESSMENT:  Ischemic left foot with pain   PLAN:  Pt consenting to left AKA  Will admit for pain control AkA early next week  Hypokalemia will treat  Stop Eliquis.  Will clarify why he is on it before starting heparin   Ruta Hinds, MD [facility] Office: [phone]

## 2020-05-08 NOTE — ED Triage Notes (Signed)
Pt presents from private residence following recent left foot bypass w/ c/o left foot pain and left great toe discoloration x 24 hours. Pt states that he has had progressive foot pain since completion of surgery and woke up to discoloration in his foot this AM.  On arrival, pt with no dopplerable pulse PT or DP on left

## 2020-05-08 NOTE — ED Notes (Signed)
Pt resting in bed. Reporting refractory left foot pain.

## 2020-05-09 LAB — BASIC METABOLIC PANEL
Anion gap: 11 (ref 5–15)
BUN: 16 mg/dL (ref 6–20)
CO2: 27 mmol/L (ref 22–32)
Calcium: 9.3 mg/dL (ref 8.9–10.3)
Chloride: 99 mmol/L (ref 98–111)
Creatinine, Ser: 0.9 mg/dL (ref 0.61–1.24)
GFR, Estimated: >60 mL/min (ref >60)
Glucose, Bld: 115 mg/dL — ABNORMAL HIGH (ref 70–99)
Potassium: 3.1 mmol/L — ABNORMAL LOW (ref 3.5–5.1)
Sodium: 137 mmol/L (ref 135–145)

## 2020-05-09 NOTE — H&P (View-Only) (Signed)
  Progress Note    05/09/2020 8:42 AM * No surgery found *  Subjective:  Pain control adequate   Vitals:   05/09/20 0541 05/09/20 0829  BP: (!) 136/94 138/85  Pulse: 92 87  Resp: 19 18  Temp: 97.9 F (36.6 C) 97.9 F (36.6 C)  SpO2: 98% 98%   Physical Exam: Lungs:  Non labored Extremities:  Gangrenous tissue changes L foot Abdomen:  soft Neurologic: a&O  CBC    Component Value Date/Time   WBC 13.7 (H) 05/08/2020 1210   RBC 3.84 (L) 05/08/2020 1210   HGB 9.3 (L) 05/08/2020 1210   HGB 13.9 03/13/2017 1032   HCT 31.0 (L) 05/08/2020 1210   HCT 41.6 03/13/2017 1032   PLT 473 (H) 05/08/2020 1210   PLT 204 03/13/2017 1032   MCV 80.7 05/08/2020 1210   MCV 84 03/13/2017 1032   MCH 24.2 (L) 05/08/2020 1210   MCHC 30.0 05/08/2020 1210   RDW 16.4 (H) 05/08/2020 1210   RDW 13.7 03/13/2017 1032   LYMPHSABS 1.5 05/08/2020 1210   LYMPHSABS 1.8 03/13/2017 1032   MONOABS 1.0 05/08/2020 1210   EOSABS 0.2 05/08/2020 1210   EOSABS 0.2 03/13/2017 1032   BASOSABS 0.1 05/08/2020 1210   BASOSABS 0.0 03/13/2017 1032    BMET    Component Value Date/Time   NA 137 05/09/2020 0200   NA 140 05/30/2018 1413   K 3.1 (L) 05/09/2020 0200   CL 99 05/09/2020 0200   CO2 27 05/09/2020 0200   GLUCOSE 115 (H) 05/09/2020 0200   BUN 16 05/09/2020 0200   BUN 9 05/30/2018 1413   CREATININE 0.90 05/09/2020 0200   CREATININE 0.80 11/27/2012 0956   CALCIUM 9.3 05/09/2020 0200   GFRNONAA >60 05/09/2020 0200   GFRNONAA >89 11/27/2012 0956   GFRAA >60 01/24/2020 0202   GFRAA >89 11/27/2012 0956    INR    Component Value Date/Time   INR 1.1 04/28/2020 1630     Intake/Output Summary (Last 24 hours) at 05/09/2020 0842 Last data filed at 05/09/2020 0830 Gross per 24 hour  Intake 120 ml  Output 1375 ml  Net -1255 ml     Assessment/Plan:  59 y.o. male with ischemic LLE  No further revascularization options LLE Patient now consenting to L AKA; admitted for pain control Pain  medication seems to be adequate Eliquis discontinued; had been prescribed from previous admission for graft patency Tentative plan is for L AKA Tuesday 11/16   Dagoberto Ligas, PA-C [facility] [phone] 05/09/2020 8:42 AM  Agree with above Will permanently d/c Eliquis since it was for graft Dr Trula Slade will see tomorrow to determine timing of AKA, most likely Tuesday with Dr Scot Dock or Donzetta Matters.  Ruta Hinds, MD [facility] [place] Office: [phone]

## 2020-05-09 NOTE — Progress Notes (Addendum)
  Progress Note    05/09/2020 8:42 AM * No surgery found *  Subjective:  Pain control adequate   Vitals:   05/09/20 0541 05/09/20 0829  BP: (!) 136/94 138/85  Pulse: 92 87  Resp: 19 18  Temp: 97.9 F (36.6 C) 97.9 F (36.6 C)  SpO2: 98% 98%   Physical Exam: Lungs:  Non labored Extremities:  Gangrenous tissue changes L foot Abdomen:  soft Neurologic: a&O  CBC    Component Value Date/Time   WBC 13.7 (H) 05/08/2020 1210   RBC 3.84 (L) 05/08/2020 1210   HGB 9.3 (L) 05/08/2020 1210   HGB 13.9 03/13/2017 1032   HCT 31.0 (L) 05/08/2020 1210   HCT 41.6 03/13/2017 1032   PLT 473 (H) 05/08/2020 1210   PLT 204 03/13/2017 1032   MCV 80.7 05/08/2020 1210   MCV 84 03/13/2017 1032   MCH 24.2 (L) 05/08/2020 1210   MCHC 30.0 05/08/2020 1210   RDW 16.4 (H) 05/08/2020 1210   RDW 13.7 03/13/2017 1032   LYMPHSABS 1.5 05/08/2020 1210   LYMPHSABS 1.8 03/13/2017 1032   MONOABS 1.0 05/08/2020 1210   EOSABS 0.2 05/08/2020 1210   EOSABS 0.2 03/13/2017 1032   BASOSABS 0.1 05/08/2020 1210   BASOSABS 0.0 03/13/2017 1032    BMET    Component Value Date/Time   NA 137 05/09/2020 0200   NA 140 05/30/2018 1413   K 3.1 (L) 05/09/2020 0200   CL 99 05/09/2020 0200   CO2 27 05/09/2020 0200   GLUCOSE 115 (H) 05/09/2020 0200   BUN 16 05/09/2020 0200   BUN 9 05/30/2018 1413   CREATININE 0.90 05/09/2020 0200   CREATININE 0.80 11/27/2012 0956   CALCIUM 9.3 05/09/2020 0200   GFRNONAA >60 05/09/2020 0200   GFRNONAA >89 11/27/2012 0956   GFRAA >60 01/24/2020 0202   GFRAA >89 11/27/2012 0956    INR    Component Value Date/Time   INR 1.1 04/28/2020 1630     Intake/Output Summary (Last 24 hours) at 05/09/2020 0842 Last data filed at 05/09/2020 0830 Gross per 24 hour  Intake 120 ml  Output 1375 ml  Net -1255 ml     Assessment/Plan:  59 y.o. male with ischemic LLE  No further revascularization options LLE Patient now consenting to L AKA; admitted for pain control Pain  medication seems to be adequate Eliquis discontinued; had been prescribed from previous admission for graft patency Tentative plan is for L AKA Tuesday 11/16   Dagoberto Ligas, PA-C [facility] [phone] 05/09/2020 8:42 AM  Agree with above Will permanently d/c Eliquis since it was for graft Dr Trula Slade will see tomorrow to determine timing of AKA, most likely Tuesday with Dr Scot Dock or Donzetta Matters.  Ruta Hinds, MD [facility] [place] Office: [phone]

## 2020-05-10 MED ORDER — POTASSIUM CHLORIDE CRYS ER 20 MEQ PO TBCR
60.0000 meq | EXTENDED_RELEASE_TABLET | Freq: Once | ORAL | Status: AC
  Administered 2020-05-10: 60 meq via ORAL
  Filled 2020-05-10: qty 3

## 2020-05-10 NOTE — Progress Notes (Addendum)
  Progress Note    05/10/2020 7:27 AM * No surgery found *  Subjective:  Ready for surgery tomorrow   Vitals:   05/09/20 2340 05/10/20 0417  BP: 125/78 122/85  Pulse:  94  Resp: 18 19  Temp: 98.1 F (36.7 C) 98.5 F (36.9 C)  SpO2: 98% 98%   Physical Exam: Lungs:  Non labored Extremities:  L foot cool and gangrenous Neurologic: A&O  CBC    Component Value Date/Time   WBC 13.7 (H) 05/08/2020 1210   RBC 3.84 (L) 05/08/2020 1210   HGB 9.3 (L) 05/08/2020 1210   HGB 13.9 03/13/2017 1032   HCT 31.0 (L) 05/08/2020 1210   HCT 41.6 03/13/2017 1032   PLT 473 (H) 05/08/2020 1210   PLT 204 03/13/2017 1032   MCV 80.7 05/08/2020 1210   MCV 84 03/13/2017 1032   MCH 24.2 (L) 05/08/2020 1210   MCHC 30.0 05/08/2020 1210   RDW 16.4 (H) 05/08/2020 1210   RDW 13.7 03/13/2017 1032   LYMPHSABS 1.5 05/08/2020 1210   LYMPHSABS 1.8 03/13/2017 1032   MONOABS 1.0 05/08/2020 1210   EOSABS 0.2 05/08/2020 1210   EOSABS 0.2 03/13/2017 1032   BASOSABS 0.1 05/08/2020 1210   BASOSABS 0.0 03/13/2017 1032    BMET    Component Value Date/Time   NA 137 05/09/2020 0200   NA 140 05/30/2018 1413   K 3.1 (L) 05/09/2020 0200   CL 99 05/09/2020 0200   CO2 27 05/09/2020 0200   GLUCOSE 115 (H) 05/09/2020 0200   BUN 16 05/09/2020 0200   BUN 9 05/30/2018 1413   CREATININE 0.90 05/09/2020 0200   CREATININE 0.80 11/27/2012 0956   CALCIUM 9.3 05/09/2020 0200   GFRNONAA >60 05/09/2020 0200   GFRNONAA >89 11/27/2012 0956   GFRAA >60 01/24/2020 0202   GFRAA >89 11/27/2012 0956    INR    Component Value Date/Time   INR 1.1 04/28/2020 1630     Intake/Output Summary (Last 24 hours) at 05/10/2020 0727 Last data filed at 05/09/2020 2300 Gross per 24 hour  Intake 480 ml  Output 2300 ml  Net -1820 ml     Assessment/Plan:  59 y.o. male with ischemic LLE   Plan is for L AKA tomorrow with Dr. Donzetta Matters or Dr. Scot Dock Consent NPO past midnight   Dagoberto Ligas, PA-C [facility] [phone] 05/10/2020 7:27 AM   I agree with the above.  I have seen and evaluated the patient.  He is in agreement to proceed with left above-knee amputation  Annamarie Major

## 2020-05-11 ENCOUNTER — Inpatient Hospital Stay (HOSPITAL_COMMUNITY): Payer: Medicare Other | Admitting: Anesthesiology

## 2020-05-11 ENCOUNTER — Encounter (HOSPITAL_COMMUNITY)
Admission: EM | Disposition: A | Discharge status: Home Health | Payer: Self-pay | Source: Home / Self Care | Attending: Surgery

## 2020-05-11 ENCOUNTER — Encounter (HOSPITAL_COMMUNITY): Payer: Self-pay | Admitting: Surgery

## 2020-05-11 DIAGNOSIS — I998 Other disorder of circulatory system: Secondary | ICD-10-CM

## 2020-05-11 HISTORY — PX: AMPUTATION: SHX166

## 2020-05-11 LAB — CREATININE, SERUM
Creatinine, Ser: 0.98 mg/dL (ref 0.61–1.24)
GFR, Estimated: >60 mL/min (ref >60)

## 2020-05-11 LAB — CBC
HCT: 26.7 % — ABNORMAL LOW (ref 39.0–52.0)
Hemoglobin: 8.5 g/dL — ABNORMAL LOW (ref 13.0–17.0)
MCH: 24.4 pg — ABNORMAL LOW (ref 26.0–34.0)
MCHC: 31.8 g/dL (ref 30.0–36.0)
MCV: 76.5 fL — ABNORMAL LOW (ref 80.0–100.0)
Platelets: 355 10*3/uL (ref 150–400)
RBC: 3.49 MIL/uL — ABNORMAL LOW (ref 4.22–5.81)
RDW: 16.2 % — ABNORMAL HIGH (ref 11.5–15.5)
WBC: 14.1 10*3/uL — ABNORMAL HIGH (ref 4.0–10.5)
nRBC: 0 % (ref 0.0–0.2)

## 2020-05-11 SURGERY — AMPUTATION, ABOVE KNEE
Anesthesia: General | Site: Knee | Laterality: Left

## 2020-05-11 MED ORDER — ONDANSETRON HCL 4 MG/2ML IJ SOLN
INTRAMUSCULAR | Status: AC
  Filled 2020-05-11: qty 2

## 2020-05-11 MED ORDER — POTASSIUM CHLORIDE CRYS ER 20 MEQ PO TBCR
20.0000 meq | EXTENDED_RELEASE_TABLET | Freq: Every day | ORAL | Status: AC | PRN
  Administered 2020-05-13: 40 meq via ORAL
  Filled 2020-05-11: qty 2

## 2020-05-11 MED ORDER — DEXAMETHASONE SODIUM PHOSPHATE 10 MG/ML IJ SOLN
INTRAMUSCULAR | Status: AC
  Filled 2020-05-11: qty 1

## 2020-05-11 MED ORDER — PROPOFOL 10 MG/ML IV BOLUS
INTRAVENOUS | Status: DC | PRN
  Administered 2020-05-11: 140 mg via INTRAVENOUS

## 2020-05-11 MED ORDER — FENTANYL CITRATE (PF) 250 MCG/5ML IJ SOLN
INTRAMUSCULAR | Status: AC
  Filled 2020-05-11: qty 5

## 2020-05-11 MED ORDER — MIDAZOLAM HCL 2 MG/2ML IJ SOLN
INTRAMUSCULAR | Status: AC
  Filled 2020-05-11: qty 2

## 2020-05-11 MED ORDER — HYDRALAZINE HCL 20 MG/ML IJ SOLN: 5.0000 mg | INTRAMUSCULAR | Status: DC | PRN

## 2020-05-11 MED ORDER — SODIUM CHLORIDE 0.9 % IV SOLN: INTRAVENOUS | Status: DC

## 2020-05-11 MED ORDER — BUPIVACAINE-EPINEPHRINE (PF) 0.5% -1:200000 IJ SOLN
INTRAMUSCULAR | Status: DC | PRN
  Administered 2020-05-11: 25 mL via PERINEURAL
  Administered 2020-05-11: 15 mL via PERINEURAL

## 2020-05-11 MED ORDER — BACITRACIN ZINC 500 UNIT/GM EX OINT
TOPICAL_OINTMENT | CUTANEOUS | Status: DC | PRN
  Administered 2020-05-11: 1 via TOPICAL

## 2020-05-11 MED ORDER — LIDOCAINE 2% (20 MG/ML) 5 ML SYRINGE
INTRAMUSCULAR | Status: AC
  Filled 2020-05-11: qty 5

## 2020-05-11 MED ORDER — BACITRACIN ZINC 500 UNIT/GM EX OINT
TOPICAL_OINTMENT | CUTANEOUS | Status: AC
  Filled 2020-05-11: qty 28.35

## 2020-05-11 MED ORDER — CHLORHEXIDINE GLUCONATE 0.12 % MT SOLN
OROMUCOSAL | Status: AC
  Administered 2020-05-11: 15 mL via OROMUCOSAL
  Filled 2020-05-11: qty 15

## 2020-05-11 MED ORDER — LIDOCAINE 2% (20 MG/ML) 5 ML SYRINGE
INTRAMUSCULAR | Status: DC | PRN
  Administered 2020-05-11: 100 mg via INTRAVENOUS

## 2020-05-11 MED ORDER — 0.9 % SODIUM CHLORIDE (POUR BTL) OPTIME
TOPICAL | Status: DC | PRN
  Administered 2020-05-11: 1000 mL

## 2020-05-11 MED ORDER — FENTANYL CITRATE (PF) 100 MCG/2ML IJ SOLN
INTRAMUSCULAR | Status: AC
  Administered 2020-05-11: 100 ug via INTRAVENOUS
  Filled 2020-05-11: qty 2

## 2020-05-11 MED ORDER — CLONIDINE HCL (ANALGESIA) 100 MCG/ML EP SOLN
EPIDURAL | Status: DC | PRN
  Administered 2020-05-11: 30 ug
  Administered 2020-05-11: 50 ug

## 2020-05-11 MED ORDER — HEPARIN SODIUM (PORCINE) 5000 UNIT/ML IJ SOLN
5000.0000 [IU] | Freq: Three times a day (TID) | INTRAMUSCULAR | Status: DC
  Administered 2020-05-12 – 2020-05-14 (×7): 5000 [IU] via SUBCUTANEOUS
  Filled 2020-05-11 (×7): qty 1

## 2020-05-11 MED ORDER — CEFAZOLIN SODIUM-DEXTROSE 2-4 GM/100ML-% IV SOLN
2.0000 g | Freq: Three times a day (TID) | INTRAVENOUS | Status: AC
  Administered 2020-05-12: 2 g via INTRAVENOUS
  Filled 2020-05-11 (×2): qty 100

## 2020-05-11 MED ORDER — MAGNESIUM SULFATE 2 GM/50ML IV SOLN: 2.0000 g | Freq: Every day | INTRAVENOUS | Status: DC | PRN

## 2020-05-11 MED ORDER — MIDAZOLAM HCL 2 MG/2ML IJ SOLN
INTRAMUSCULAR | Status: AC
  Administered 2020-05-11: 1 mg via INTRAVENOUS
  Filled 2020-05-11: qty 2

## 2020-05-11 MED ORDER — PHENYLEPHRINE 40 MCG/ML (10ML) SYRINGE FOR IV PUSH (FOR BLOOD PRESSURE SUPPORT)
PREFILLED_SYRINGE | INTRAVENOUS | Status: AC
  Filled 2020-05-11: qty 10

## 2020-05-11 MED ORDER — PROPOFOL 10 MG/ML IV BOLUS
INTRAVENOUS | Status: AC
  Filled 2020-05-11: qty 20

## 2020-05-11 MED ORDER — DEXAMETHASONE SODIUM PHOSPHATE 4 MG/ML IJ SOLN
INTRAMUSCULAR | Status: DC | PRN
  Administered 2020-05-11: 5 mg via PERINEURAL
  Administered 2020-05-11: 3 mg via PERINEURAL
  Administered 2020-05-11: 10 mg via PERINEURAL

## 2020-05-11 MED ORDER — BISACODYL 5 MG PO TBEC: 5.0000 mg | DELAYED_RELEASE_TABLET | Freq: Every day | ORAL | Status: DC | PRN

## 2020-05-11 MED ORDER — DEXAMETHASONE SODIUM PHOSPHATE 10 MG/ML IJ SOLN
INTRAMUSCULAR | Status: DC | PRN
  Administered 2020-05-11: 10 mg via INTRAVENOUS

## 2020-05-11 MED ORDER — ROCURONIUM BROMIDE 10 MG/ML (PF) SYRINGE
PREFILLED_SYRINGE | INTRAVENOUS | Status: AC
  Filled 2020-05-11: qty 10

## 2020-05-11 MED ORDER — PROMETHAZINE HCL 25 MG/ML IJ SOLN: 6.2500 mg | INTRAMUSCULAR | Status: DC | PRN

## 2020-05-11 MED ORDER — LACTATED RINGERS IV SOLN: INTRAVENOUS | Status: DC

## 2020-05-11 MED ORDER — CEFAZOLIN SODIUM-DEXTROSE 2-4 GM/100ML-% IV SOLN
INTRAVENOUS | Status: AC
  Administered 2020-05-11: 2 g via INTRAVENOUS
  Filled 2020-05-11: qty 100

## 2020-05-11 MED ORDER — CEFAZOLIN SODIUM-DEXTROSE 2-4 GM/100ML-% IV SOLN
2.0000 g | Freq: Once | INTRAVENOUS | Status: AC
  Administered 2020-05-11: 2 g via INTRAVENOUS

## 2020-05-11 MED ORDER — PHENYLEPHRINE 40 MCG/ML (10ML) SYRINGE FOR IV PUSH (FOR BLOOD PRESSURE SUPPORT)
PREFILLED_SYRINGE | INTRAVENOUS | Status: DC | PRN
  Administered 2020-05-11 (×2): 120 ug via INTRAVENOUS
  Administered 2020-05-11: 160 ug via INTRAVENOUS
  Administered 2020-05-11: 80 ug via INTRAVENOUS
  Administered 2020-05-11: 120 ug via INTRAVENOUS
  Administered 2020-05-11: 80 ug via INTRAVENOUS

## 2020-05-11 MED ORDER — FENTANYL CITRATE (PF) 100 MCG/2ML IJ SOLN
25.0000 ug | INTRAMUSCULAR | Status: DC | PRN
  Administered 2020-05-11 (×2): 50 ug via INTRAVENOUS

## 2020-05-11 MED ORDER — CHLORHEXIDINE GLUCONATE 0.12 % MT SOLN: 15.0000 mL | Freq: Once | OROMUCOSAL | Status: AC

## 2020-05-11 MED ORDER — MEPERIDINE HCL 25 MG/ML IJ SOLN: 6.2500 mg | INTRAMUSCULAR | Status: DC | PRN

## 2020-05-11 MED ORDER — MIDAZOLAM HCL 2 MG/2ML IJ SOLN: 1.0000 mg | Freq: Once | INTRAMUSCULAR | Status: AC

## 2020-05-11 MED ORDER — SENNOSIDES-DOCUSATE SODIUM 8.6-50 MG PO TABS: 1.0000 | ORAL_TABLET | Freq: Every evening | ORAL | Status: DC | PRN

## 2020-05-11 MED ORDER — FENTANYL CITRATE (PF) 100 MCG/2ML IJ SOLN
INTRAMUSCULAR | Status: AC
  Filled 2020-05-11: qty 2

## 2020-05-11 MED ORDER — FENTANYL CITRATE (PF) 100 MCG/2ML IJ SOLN: 100.0000 ug | Freq: Once | INTRAMUSCULAR | Status: AC

## 2020-05-11 SURGICAL SUPPLY — 54 items
BANDAGE ESMARK 6X9 LF (GAUZE/BANDAGES/DRESSINGS) ×1 IMPLANT
BLADE SAW RECIP 87.9 MT (BLADE) ×2 IMPLANT
BNDG CMPR 9X6 STRL LF SNTH (GAUZE/BANDAGES/DRESSINGS) ×1
BNDG COHESIVE 6X5 TAN STRL LF (GAUZE/BANDAGES/DRESSINGS) ×1 IMPLANT
BNDG ELASTIC 4X5.8 VLCR STR LF (GAUZE/BANDAGES/DRESSINGS) ×3 IMPLANT
BNDG ELASTIC 6X5.8 VLCR STR LF (GAUZE/BANDAGES/DRESSINGS) ×1 IMPLANT
BNDG ESMARK 6X9 LF (GAUZE/BANDAGES/DRESSINGS) ×2
BNDG GAUZE ELAST 4 BULKY (GAUZE/BANDAGES/DRESSINGS) ×3 IMPLANT
CANISTER SUCT 3000ML PPV (MISCELLANEOUS) ×2 IMPLANT
COVER SURGICAL LIGHT HANDLE (MISCELLANEOUS) ×2 IMPLANT
CUFF TOURN SGL QUICK 18X4 (TOURNIQUET CUFF) IMPLANT
CUFF TOURN SGL QUICK 24 (TOURNIQUET CUFF) ×2
CUFF TOURN SGL QUICK 34 (TOURNIQUET CUFF)
CUFF TOURN SGL QUICK 42 (TOURNIQUET CUFF) IMPLANT
CUFF TRNQT CYL 24X4X16.5-23 (TOURNIQUET CUFF) IMPLANT
CUFF TRNQT CYL 34X4.125X (TOURNIQUET CUFF) IMPLANT
DRAIN CHANNEL 19F RND (DRAIN) IMPLANT
DRAPE HALF SHEET 40X57 (DRAPES) ×2 IMPLANT
DRAPE ORTHO SPLIT 77X108 STRL (DRAPES) ×4
DRAPE SURG ORHT 6 SPLT 77X108 (DRAPES) ×2 IMPLANT
DRAPE U-SHAPE 47X51 STRL (DRAPES) ×2 IMPLANT
DRSG ADAPTIC 3X8 NADH LF (GAUZE/BANDAGES/DRESSINGS) ×2 IMPLANT
ELECT CAUTERY BLADE 6.4 (BLADE) ×2 IMPLANT
ELECT REM PT RETURN 9FT ADLT (ELECTROSURGICAL) ×2
ELECTRODE REM PT RTRN 9FT ADLT (ELECTROSURGICAL) ×1 IMPLANT
EVACUATOR SILICONE 100CC (DRAIN) IMPLANT
GAUZE SPONGE 4X4 12PLY STRL (GAUZE/BANDAGES/DRESSINGS) ×2 IMPLANT
GLOVE BIO SURGEON STRL SZ7 (GLOVE) ×1 IMPLANT
GLOVE BIO SURGEON STRL SZ7.5 (GLOVE) ×2 IMPLANT
GLOVE BIOGEL PI IND STRL 6.5 (GLOVE) IMPLANT
GLOVE BIOGEL PI IND STRL 8 (GLOVE) ×1 IMPLANT
GLOVE BIOGEL PI INDICATOR 6.5 (GLOVE) ×3
GLOVE BIOGEL PI INDICATOR 8 (GLOVE) ×1
GOWN STRL REUS W/ TWL LRG LVL3 (GOWN DISPOSABLE) ×3 IMPLANT
GOWN STRL REUS W/TWL LRG LVL3 (GOWN DISPOSABLE) ×6
KIT BASIN OR (CUSTOM PROCEDURE TRAY) ×2 IMPLANT
KIT TURNOVER KIT B (KITS) ×2 IMPLANT
NS IRRIG 1000ML POUR BTL (IV SOLUTION) ×2 IMPLANT
PACK GENERAL/GYN (CUSTOM PROCEDURE TRAY) ×2 IMPLANT
PAD ARMBOARD 7.5X6 YLW CONV (MISCELLANEOUS) ×4 IMPLANT
RASP HELIOCORDIAL MED (MISCELLANEOUS) IMPLANT
STAPLER VISISTAT (STAPLE) ×2 IMPLANT
STAPLER VISISTAT 35W (STAPLE) ×1 IMPLANT
STOCKINETTE IMPERVIOUS LG (DRAPES) ×2 IMPLANT
SUT ETHILON 3 0 PS 1 (SUTURE) IMPLANT
SUT SILK 2 0 (SUTURE) ×2
SUT SILK 2 0 SH CR/8 (SUTURE) ×1 IMPLANT
SUT SILK 2-0 18XBRD TIE 12 (SUTURE) IMPLANT
SUT VIC AB 2-0 CT1 18 (SUTURE) ×2 IMPLANT
SYR BULB IRRIG 60ML STRL (SYRINGE) ×1 IMPLANT
TOWEL GREEN STERILE (TOWEL DISPOSABLE) ×3 IMPLANT
TOWEL GREEN STERILE FF (TOWEL DISPOSABLE) ×2 IMPLANT
UNDERPAD 30X36 HEAVY ABSORB (UNDERPADS AND DIAPERS) ×3 IMPLANT
WATER STERILE IRR 1000ML POUR (IV SOLUTION) ×2 IMPLANT

## 2020-05-11 NOTE — Op Note (Signed)
    NAME: [name]    MRN: [id number] DOB: 1960/10/15    DATE OF OPERATION: 05/11/2020  PREOP DIAGNOSIS:    Ischemic left lower extremity  POSTOP DIAGNOSIS:    Same  PROCEDURE:    Left AKA  SURGEON: Judeth Cornfield. Scot Dock, MD  ASSIST: Olin Pia, RNFA  ANESTHESIA: General  EBL: Minimal  INDICATIONS:    [name] is a 59 y.o. male who had a nonretrievable left lower extremity and was set up for AKA.  FINDINGS:   The muscle was well perfused.  TECHNIQUE:   The patient was taken to the operating room and received a general anesthetic.  The left lower extremity was prepped and draped in usual sterile fashion.  A tourniquet was placed on the upper thigh.  I marked a fishmouth incision approximately 10 cm above the top of the patella.  The leg was exsanguinated with an Esmarch bandage and the tourniquet inflated to 250 mmHg.  Under tourniquet control he and incision was carried down through the skin, subcutaneous tissue, muscle and fascia to the femur which was dissected free circumferentially.  The bone was divided proximal to the level of skin division.  The leg was removed.  The femoral artery and vein were individually suture ligated with 2-0 silk ties.  The tourniquet was released.  Additional hemostasis was obtained using electrocautery and 2-0 silk ties.  The edges of the bone were rasped.  The wound was irrigated with copious amounts of saline.  The fascial layer was closed with interrupted 2-0 Vicryl's and the skin was closed with staples.  A sterile dressing was applied.  The patient tolerated the procedure well and was transferred to the recovery room in stable condition.  All needle and sponge counts were correct.  Given the complexity of the case a first assistant was necessary in order to expedient the procedure and safely perform the technical aspects of the operation.  Deitra Mayo, MD, FACS [facility]  DATE OF  DICTATION:   05/11/2020

## 2020-05-11 NOTE — Anesthesia Preprocedure Evaluation (Addendum)
Anesthesia Evaluation  Patient identified by MRN, date of birth, ID band Patient awake    Reviewed: Allergy & Precautions, NPO status , Patient's Chart, lab work & pertinent test results, reviewed documented beta blocker date and time   Airway Mallampati: III  TM Distance: >3 FB Neck ROM: Full    Dental  (+) Edentulous Upper, Edentulous Lower   Pulmonary Current Smoker and Patient abstained from smoking.,    Pulmonary exam normal breath sounds clear to auscultation       Cardiovascular hypertension, Pt. on medications + CAD and + Peripheral Vascular Disease  Normal cardiovascular exam Rhythm:Regular Rate:Normal  Echo 01/2019 1. The left ventricle has low normal systolic function, with an ejection fraction of 50-55%. The cavity size was normal. Left ventricular diastolic Doppler parameters are consistent with impaired relaxation.  2. The right ventricle has normal systolic function. The cavity was normal. There is no increase in right ventricular wall thickness. Right ventricular systolic pressure is normal with an estimated pressure of 11.4 mmHg.  3. The aortic valve is tricuspid. Mild thickening of the aortic valve. Mild calcification of the aortic valve. Aortic valve regurgitation is trivial by color flow Doppler. No stenosis of the aortic valve.  4. The aorta is normal unless otherwise noted.    Neuro/Psych negative neurological ROS  negative psych ROS   GI/Hepatic Neg liver ROS, GERD  Controlled,  Endo/Other  negative endocrine ROS  Renal/GU negative Renal ROS     Musculoskeletal  (+) Arthritis , Rheumatoid disorders,    Abdominal   Peds  Hematology  (+) anemia , HLD   Anesthesia Other Findings LEFT LEG PAIN  Reproductive/Obstetrics                           Anesthesia Physical  Anesthesia Plan  ASA: III  Anesthesia Plan: General   Post-op Pain Management: GA combined w/ Regional  for post-op pain   Induction: Intravenous  PONV Risk Score and Plan: 1 and Ondansetron, Dexamethasone, Midazolam and Treatment may vary due to age or medical condition  Airway Management Planned: LMA  Additional Equipment: None  Intra-op Plan:   Post-operative Plan: Extubation in OR  Informed Consent: I have reviewed the patients History and Physical, chart, labs and discussed the procedure including the risks, benefits and alternatives for the proposed anesthesia with the patient or authorized representative who has indicated his/her understanding and acceptance.     Dental advisory given  Plan Discussed with: CRNA  Anesthesia Plan Comments:        Anesthesia Quick Evaluation

## 2020-05-11 NOTE — Anesthesia Postprocedure Evaluation (Signed)
Anesthesia Post Note  Patient: [name] [name]  Procedure(s) Performed: LEFT ABOVE KNEE AMPUTATION (Left Knee)     Patient location during evaluation: PACU Anesthesia Type: General Level of consciousness: sedated and patient cooperative Pain management: pain level controlled Vital Signs Assessment: post-procedure vital signs reviewed and stable Respiratory status: spontaneous breathing Cardiovascular status: stable Anesthetic complications: no   No complications documented.  Last Vitals:  Vitals:   05/11/20 1230 05/11/20 1237  BP: 108/74 107/71  Pulse: 86 85  Resp: 17 17  Temp:  36.5 C  SpO2: 96% 100%    Last Pain:  Vitals:   05/11/20 1200  TempSrc:   PainSc: [place]

## 2020-05-11 NOTE — Transfer of Care (Signed)
Immediate Anesthesia Transfer of Care Note  Patient: [name] [name]  Procedure(s) Performed: LEFT ABOVE KNEE AMPUTATION (Left Knee)  Patient Location: PACU  Anesthesia Type:GA combined with regional for post-op pain  Level of Consciousness: awake, alert , oriented and patient cooperative  Airway & Oxygen Therapy: Patient Spontanous Breathing and Patient connected to face mask oxygen  Post-op Assessment: Report given to RN, Post -op Vital signs reviewed and stable and Patient moving all extremities  Post vital signs: Reviewed and stable  Last Vitals:  Vitals Value Taken Time  BP 114/67 05/11/20 1131  Temp    Pulse 90 05/11/20 1131  Resp 18 05/11/20 1131  SpO2 94 % 05/11/20 1131  Vitals shown include unvalidated device data.  Last Pain:  Vitals:   05/11/20 0851  TempSrc:   PainSc: 4       Patients Stated Pain Goal: 3 (75/79/72 8206)  Complications: No complications documented.

## 2020-05-11 NOTE — Interval H&P Note (Signed)
History and Physical Interval Note:  05/11/2020 9:10 AM  [name] [name]  has presented today for surgery, with the diagnosis of CRITICAL LOWER LIMB ISCHEMIA.  The various methods of treatment have been discussed with the patient and family. After consideration of risks, benefits and other options for treatment, the patient has consented to  Procedure(s): LEFT ABOVE KNEE AMPUTATION (Left) as a surgical intervention.  The patient's history has been reviewed, patient examined, no change in status, stable for surgery.  I have reviewed the patient's chart and labs.  Questions were answered to the patient's satisfaction.     Deitra Mayo

## 2020-05-11 NOTE — Plan of Care (Signed)
  Problem: Clinical Measurements: Goal: Postoperative complications will be avoided or minimized Outcome: Progressing   

## 2020-05-11 NOTE — Anesthesia Procedure Notes (Signed)
Procedure Name: LMA Insertion Date/Time: 05/11/2020 10:13 AM Performed by: Myna Bright, CRNA Pre-anesthesia Checklist: Patient identified, Emergency Drugs available, Suction available and Patient being monitored Patient Re-evaluated:Patient Re-evaluated prior to induction Oxygen Delivery Method: Circle system utilized Preoxygenation: Pre-oxygenation with 100% oxygen Induction Type: IV induction LMA: LMA inserted LMA Size: 4.0 Number of attempts: 1 Placement Confirmation: positive ETCO2 and breath sounds checked- equal and bilateral Tube secured with: Tape Dental Injury: Teeth and Oropharynx as per pre-operative assessment

## 2020-05-11 NOTE — Anesthesia Procedure Notes (Signed)
Anesthesia Regional Block: Popliteal block   Pre-Anesthetic Checklist: ,, timeout performed, Correct Patient, Correct Site, Correct Laterality, Correct Procedure, Correct Position, site marked, Risks and benefits discussed,  Surgical consent,  Pre-op evaluation,  At surgeon's request and post-op pain management  Laterality: Left  Prep: chloraprep       Needles:  Injection technique: Single-shot  Needle Type: Stimiplex     Needle Length: 10cm  Needle Gauge: 21     Additional Needles:   Procedures:, nerve stimulator,,, ultrasound used (permanent image in chart),,,,  Motor weakness within 5 minutes.   Nerve Stimulator or Paresthesia:  Response: Foot eversion, 1 mA,   Additional Responses:   Narrative:  Start time: 05/11/2020 9:35 AM End time: 05/11/2020 9:40 AM Injection made incrementally with aspirations every 5 mL.  Performed by: Personally  Anesthesiologist: Nolon Nations, MD  Additional Notes: Nerve located and needle positioned with direct ultrasound guidance. Good perineural spread. Patient tolerated well.

## 2020-05-11 NOTE — Anesthesia Procedure Notes (Signed)
Anesthesia Regional Block: Adductor canal block   Pre-Anesthetic Checklist: ,, timeout performed, Correct Patient, Correct Site, Correct Laterality, Correct Procedure, Correct Position, site marked, Risks and benefits discussed,  Surgical consent,  Pre-op evaluation,  At surgeon's request and post-op pain management  Laterality: Left  Prep: chloraprep       Needles:  Injection technique: Single-shot  Needle Type: Stimiplex     Needle Length: 9cm  Needle Gauge: 21     Additional Needles:   Procedures:,,,, ultrasound used (permanent image in chart),,,,  Narrative:  Start time: 05/11/2020 9:30 AM End time: 05/11/2020 9:35 AM Injection made incrementally with aspirations every 5 mL.  Performed by: Personally  Anesthesiologist: Nolon Nations, MD  Additional Notes: BP cuff, EKG monitors applied. Sedation begun. Artery and nerve location verified with U/S and anesthetic injected incrementally, slowly, and after negative aspirations under direct u/s guidance. Good fascial /perineural spread. Tolerated well.

## 2020-05-12 ENCOUNTER — Encounter (HOSPITAL_COMMUNITY): Payer: Self-pay | Admitting: Vascular Surgery

## 2020-05-12 LAB — CBC
HCT: 26.6 % — ABNORMAL LOW (ref 39.0–52.0)
Hemoglobin: 8.4 g/dL — ABNORMAL LOW (ref 13.0–17.0)
MCH: 24.3 pg — ABNORMAL LOW (ref 26.0–34.0)
MCHC: 31.6 g/dL (ref 30.0–36.0)
MCV: 77.1 fL — ABNORMAL LOW (ref 80.0–100.0)
Platelets: 340 10*3/uL (ref 150–400)
RBC: 3.45 MIL/uL — ABNORMAL LOW (ref 4.22–5.81)
RDW: 16 % — ABNORMAL HIGH (ref 11.5–15.5)
WBC: 15.1 10*3/uL — ABNORMAL HIGH (ref 4.0–10.5)
nRBC: 0 % (ref 0.0–0.2)

## 2020-05-12 LAB — BASIC METABOLIC PANEL
Anion gap: 8 (ref 5–15)
BUN: 25 mg/dL — ABNORMAL HIGH (ref 6–20)
CO2: 25 mmol/L (ref 22–32)
Calcium: 8.9 mg/dL (ref 8.9–10.3)
Chloride: 101 mmol/L (ref 98–111)
Creatinine, Ser: 1.04 mg/dL (ref 0.61–1.24)
GFR, Estimated: >60 mL/min (ref >60)
Glucose, Bld: 146 mg/dL — ABNORMAL HIGH (ref 70–99)
Potassium: 4 mmol/L (ref 3.5–5.1)
Sodium: 134 mmol/L — ABNORMAL LOW (ref 135–145)

## 2020-05-12 NOTE — Evaluation (Signed)
Physical Therapy Evaluation Patient Details Name: [name] [name] MRN: [id number] DOB: 1960-08-10 Today's Date: 05/12/2020   History of Present Illness  59 y.o. male, PMH: HTN, PAD, DDD,  s/p multiple L LE revascularizations discharged from hospital a 05/05/20.  At that time he was deemed to not be a candidate for further revasc procedures.  Dc'd home with a known bypass occlusion with a plan for AKA if worsening pain. Returned to ED 05/08/20  for increased pain and seeking amputation. s/p L AKA 05/11/20  Clinical Impression  Pt known to therapist from prior admission. Pt lives with girlfriend and girlfriend's son in single story home with 3 steps to enter. Pt reports he will get his girlfriend's son to build him a ramp. Pt is limited in mobility by pain, change in CoG, and UE weakness. Pt is currently mod I for bed mobility, and minA for transfers and hopping to chair on his R. PT recommends d/c home with HHPT and wheelchair. PT will continue to follow acutely and progress stair training.    Follow Up Recommendations Home health PT;Supervision/Assistance - 24 hour    Equipment Recommendations  Wheelchair (measurements PT);Wheelchair cushion (measurements PT);3in1 (PT)    Recommendations for Other Services OT consult     Precautions / Restrictions Precautions Precautions: Fall Restrictions Weight Bearing Restrictions: No      Mobility  Bed Mobility Overal bed mobility: Modified Independent             General bed mobility comments: HoB elevated, vc for use of bedrail    Transfers Overall transfer level: Needs assistance Equipment used: Rolling walker (2 wheeled) Transfers: Sit to/from Stand Sit to Stand: Min assist         General transfer comment: minA for steadying RW as pt pulls up on it to get to upright, min A for steadying in standing   Ambulation/Gait Ambulation/Gait assistance: Min assist Gait Distance (Feet): 2 Feet Assistive device: Rolling walker (2  wheeled) Gait Pattern/deviations: Step-to pattern Gait velocity: slowed Gait velocity interpretation: <1.31 ft/sec, indicative of household ambulator General Gait Details: minA for steadying with RW, for hopping towards recliner on his R        Balance Overall balance assessment: Needs assistance Sitting-balance support: Feet supported Sitting balance-Leahy Scale: Fair     Standing balance support: Bilateral upper extremity supported Standing balance-Leahy Scale: Poor Standing balance comment: adjusting to change in CoG                             Pertinent Vitals/Pain Pain Assessment: 0-10 Pain Score: 8  Pain Descriptors / Indicators: Aching;Sharp;Stabbing Pain Intervention(s): Limited activity within patient's tolerance;Monitored during session;Repositioned    Home Living Family/patient expects to be discharged to:: Private residence Living Arrangements: Other relatives;Non-relatives/Friends Available Help at Discharge:  (girlfriends son  and girlfriend) Type of Home: House Home Access: Stairs to enter Entrance Stairs-Rails: Can reach both Technical brewer of Steps: 3 Home Layout: One level Home Equipment: Tub bench;Hand held shower head Additional Comments: Pt report he will have his girlfriends son work on putting up a ramp    Prior Function Level of Independence: Independent         Comments: enjoys hunting and Firefighter Dominance   Dominant Hand: Left    Extremity/Trunk Assessment   Upper Extremity Assessment Upper Extremity Assessment: Defer to OT evaluation    Lower Extremity Assessment Lower Extremity Assessment: LLE deficits/detail RLE  Deficits / Details: Overall WFL  LLE Deficits / Details: L AKA        Communication   Communication: No difficulties  Cognition Arousal/Alertness: Awake/alert Behavior During Therapy: WFL for tasks assessed/performed Overall Cognitive Status: Within Functional Limits for tasks  assessed                                        General Comments General comments (skin integrity, edema, etc.): BP in supine 114/78, c/o of dizziness with coming to seated BP 127/78, dizziness dissipated prior to transfer, HR 70-80s, residual limb wrap clean, dry and intact        Assessment/Plan    PT Assessment Patient needs continued PT services  PT Problem List Decreased activity tolerance;Decreased balance;Decreased mobility;Decreased knowledge of use of DME;Pain       PT Treatment Interventions DME instruction;Gait training;Stair training;Functional mobility training;Therapeutic activities;Therapeutic exercise;Balance training;Cognitive remediation;Patient/family education    PT Goals (Current goals can be found in the Care Plan section)  Acute Rehab PT Goals Patient Stated Goal: get back outside PT Goal Formulation: With patient Time For Goal Achievement: 05/26/20 Potential to Achieve Goals: Good    Frequency Min 3X/week    AM-PAC PT "6 Clicks" Mobility  Outcome Measure Help needed turning from your back to your side while in a flat bed without using bedrails?: None Help needed moving from lying on your back to sitting on the side of a flat bed without using bedrails?: None Help needed moving to and from a bed to a chair (including a wheelchair)?: A Little Help needed standing up from a chair using your arms (e.g., wheelchair or bedside chair)?: A Little Help needed to walk in hospital room?: A Lot Help needed climbing 3-5 steps with a railing? : A Lot 6 Click Score: 18    End of Session Equipment Utilized During Treatment: Gait belt Activity Tolerance: Patient limited by pain Patient left: in chair;with call bell/phone within reach Nurse Communication: Mobility status PT Visit Diagnosis: Unsteadiness on feet (R26.81);Other abnormalities of gait and mobility (R26.89);Ataxic gait (R26.0);Difficulty in walking, not elsewhere classified (R26.2) Pain -  Right/Left: Left Pain - part of body: Leg    Time: 5701-7793 PT Time Calculation (min) (ACUTE ONLY): 45 min   Charges:   PT Evaluation $PT Eval Moderate Complexity: 1 Mod PT Treatments $Therapeutic Activity: 23-37 mins        Mckenna Gamm B. Migdalia Dk PT, DPT Acute Rehabilitation Services Pager [phone] Office [phone]   [place] 05/12/2020, 9:59 AM

## 2020-05-12 NOTE — Progress Notes (Signed)
   VASCULAR SURGERY ASSESSMENT & PLAN:   POD 1 LEFT AKA:  His dressing is dry.  Dressing change tomorrow.  DVT PROPHYLAXIS: He is on subcu heparin.  CIR/PTx consulted.   SUBJECTIVE:   Pain well controlled.  PHYSICAL EXAM:   Vitals:   05/11/20 1700 05/11/20 2021 05/12/20 0014 05/12/20 0554  BP: 116/78 103/69 117/66 124/82  Pulse: 89 88 81 79  Resp: 17 20 20 18   Temp:  98.4 F (36.9 C) 97.9 F (36.6 C) 97.9 F (36.6 C)  TempSrc:  Oral Oral Oral  SpO2: 96% 93% 93% 97%  Weight:      Height:       His dressing on his left AKA is dry.  LABS:   Lab Results  Component Value Date   WBC 15.1 (H) 05/12/2020   HGB 8.4 (L) 05/12/2020   HCT 26.6 (L) 05/12/2020   MCV 77.1 (L) 05/12/2020   PLT 340 05/12/2020    PROBLEM LIST:    Active Problems:   PAD (peripheral artery disease) (HCC)   CURRENT MEDS:   . amLODipine  10 mg Oral Daily  . ascorbic acid  500 mg Oral QODAY  . atorvastatin  80 mg Oral q1800  . chlorthalidone  25 mg Oral Daily  . docusate sodium  100 mg Oral BID  . heparin  5,000 Units Subcutaneous Q8H  . irbesartan  37.5 mg Oral Daily  . pantoprazole  40 mg Oral Daily  . sodium chloride flush  3 mL Intravenous Q12H    [name] [name] Office: [phone] 05/12/2020

## 2020-05-12 NOTE — Plan of Care (Signed)
Continue to monitor

## 2020-05-12 NOTE — Evaluation (Signed)
Occupational Therapy Evaluation Patient Details Name: [name] [name] MRN: [id number] DOB: March 11, 1961 Today's Date: 05/12/2020    History of Present Illness 59 y.o. male, PMH: HTN, PAD, DDD,  s/p multiple L LE revascularizations discharged from hospital a 05/05/20.  At that time he was deemed to not be a candidate for further revasc procedures.  Dc'd home with a known bypass occlusion with a plan for AKA if worsening pain. Returned to ED 05/08/20  for increased pain and seeking amputation. s/p L AKA 05/11/20   Clinical Impression   Patient admitted for above diagnosis and procedure.  At home he was in pain, due to the left leg, but was still largely independent with ADL, toilet, and functional mobility.  Currently after his AKA he presents with generalized weakness, poor stand balance, pain to residual leg, decreased activity tolerance, all of which are impacting his current independence in the acute setting.  OT will follow in the acute setting, [place] services have been recommended.      Follow Up Recommendations  Home health OT    Equipment Recommendations  Wheelchair [phone]);Wheelchair cushion [phone])    Recommendations for Other Services       Precautions / Restrictions Precautions Precautions: Fall Restrictions Weight Bearing Restrictions: Yes LLE Weight Bearing: Non weight bearing      Mobility Bed Mobility Overal bed mobility: Modified Independent             General bed mobility comments: HoB elevated, vc for use of bedrail    Transfers Overall transfer level: Needs assistance Equipment used: Rolling walker (2 wheeled) Transfers: Sit to/from Stand Sit to Stand: Min assist         General transfer comment: patient just returned to bed.    Balance Overall balance assessment: Needs assistance Sitting-balance support: Feet supported Sitting balance-Leahy Scale: Fair     Standing balance support: Bilateral upper extremity supported Standing balance-Leahy  Scale: Poor                            ADL either performed or assessed with clinical judgement   ADL   Eating/Feeding: Independent;Sitting   Grooming: Set up;Sitting   Upper Body Bathing: Set up;Sitting   Lower Body Bathing: Minimal assistance;Bed level   Upper Body Dressing : Set up;Sitting   Lower Body Dressing: Minimal assistance;Bed level                       Vision Patient Visual Report: No change from baseline       Perception     Praxis      Pertinent Vitals/Pain Pain Assessment: 0-10 Pain Score: 8  Faces Pain Scale: Hurts little more Pain Location: stump Pain Descriptors / Indicators: Tender Pain Intervention(s): Monitored during session     Hand Dominance Left   Extremity/Trunk Assessment Upper Extremity Assessment Upper Extremity Assessment: Generalized weakness;LUE deficits/detail;RUE deficits/detail RUE Deficits / Details: RA to hands RUE Sensation: WNL RUE Coordination: WNL LUE Deficits / Details: decreased shoulder AROM.  RA to hands LUE Sensation: WNL LUE Coordination: WNL   Lower Extremity Assessment Lower Extremity Assessment: Defer to PT evaluation RLE Deficits / Details: Overall WFL  LLE Deficits / Details: L AKA        Communication Communication Communication: No difficulties   Cognition Arousal/Alertness: Awake/alert Behavior During Therapy: WFL for tasks assessed/performed Overall Cognitive Status: Within Functional Limits for tasks assessed  General Comments  VSS   Exercises     Shoulder Instructions      Home Living Family/patient expects to be discharged to:: Private residence Living Arrangements: Other relatives;Non-relatives/Friends Available Help at Discharge: Family Type of Home: House Home Access: Stairs to enter [employer] of Steps: 3 Entrance Stairs-Rails: Can reach both Home Layout: One level     Bathroom Shower/Tub:  Teacher, early years/pre: Standard Bathroom Accessibility: Yes How Accessible: Accessible via walker Home Equipment: Tub bench;Hand held shower head   Additional Comments: Working on ramp.  ordered grabbars for tub/shower      Prior Functioning/Environment Level of Independence: Independent                OT Problem List: Decreased range of motion;Impaired balance (sitting and/or standing);Pain;Decreased strength      OT Treatment/Interventions: Self-care/ADL training;Therapeutic exercise;DME and/or AE instruction;Therapeutic activities;Balance training;Patient/family education    OT Goals(Current goals can be found in the care plan section) Acute Rehab OT Goals Patient Stated Goal: Get my leg so I can move better. OT Goal Formulation: With patient Time For Goal Achievement: 05/26/20 Potential to Achieve Goals: Good ADL Goals Pt Will Perform Grooming: with set-up;sitting;standing Pt Will Perform Lower Body Bathing: with supervision;sitting/lateral leans Pt Will Perform Lower Body Dressing: with supervision;sit to/from stand Pt Will Transfer to Toilet: ambulating;regular height toilet;with supervision  OT Frequency: Min 3X/week   Barriers to D/C: Inaccessible home environment          Co-evaluation              AM-PAC OT "6 Clicks" Daily Activity     Outcome Measure Help from another person eating meals?: None Help from another person taking care of personal grooming?: None Help from another person toileting, which includes using toliet, bedpan, or urinal?: A Little Help from another person bathing (including washing, rinsing, drying)?: A Little Help from another person to put on and taking off regular upper body clothing?: None Help from another person to put on and taking off regular lower body clothing?: A Little 6 Click Score: 21   End of Session    Activity Tolerance: Patient limited by pain Patient left: in bed;with call bell/phone within  reach  OT Visit Diagnosis: Unsteadiness on feet (R26.81);Other abnormalities of gait and mobility (R26.89);Pain;Muscle weakness (generalized) (M62.81) Pain - Right/Left: Left Pain - part of body: Leg                Time: 8182-9937 OT Time Calculation (min): 23 min Charges:  OT General Charges $OT Visit: 1 Visit OT Evaluation $OT Eval Moderate Complexity: 1 Mod  05/12/2020  Rich, OTR/L  Acute Rehabilitation Services  Office:  [place] 05/12/2020, 12:11 PM

## 2020-05-12 NOTE — Progress Notes (Signed)
Inpatient Rehabilitation-Admissions Coordinator   CIR consult received after recent AKA. Noted PT and OT recommendations are for home with home health. Based on functional level at evaluations feel this is appropriate. AC will DC CIR consult order. Please call if questions.   Raechel Ache, OTR/L  Rehab Admissions Coordinator  [phone] 05/12/2020 1:56 PM

## 2020-05-13 LAB — BASIC METABOLIC PANEL
Anion gap: 10 (ref 5–15)
BUN: 24 mg/dL — ABNORMAL HIGH (ref 6–20)
CO2: 25 mmol/L (ref 22–32)
Calcium: 9.1 mg/dL (ref 8.9–10.3)
Chloride: 101 mmol/L (ref 98–111)
Creatinine, Ser: 0.89 mg/dL (ref 0.61–1.24)
GFR, Estimated: >60 mL/min (ref >60)
Glucose, Bld: 119 mg/dL — ABNORMAL HIGH (ref 70–99)
Potassium: 3.3 mmol/L — ABNORMAL LOW (ref 3.5–5.1)
Sodium: 136 mmol/L (ref 135–145)

## 2020-05-13 LAB — CBC
HCT: 27.7 % — ABNORMAL LOW (ref 39.0–52.0)
Hemoglobin: 8.4 g/dL — ABNORMAL LOW (ref 13.0–17.0)
MCH: 23.5 pg — ABNORMAL LOW (ref 26.0–34.0)
MCHC: 30.3 g/dL (ref 30.0–36.0)
MCV: 77.6 fL — ABNORMAL LOW (ref 80.0–100.0)
Platelets: 340 10*3/uL (ref 150–400)
RBC: 3.57 MIL/uL — ABNORMAL LOW (ref 4.22–5.81)
RDW: 16 % — ABNORMAL HIGH (ref 11.5–15.5)
WBC: 11.4 10*3/uL — ABNORMAL HIGH (ref 4.0–10.5)
nRBC: 0 % (ref 0.0–0.2)

## 2020-05-13 LAB — SURGICAL PATHOLOGY

## 2020-05-13 MED ORDER — OXYCODONE-ACETAMINOPHEN 10-325 MG PO TABS
1.0000 | ORAL_TABLET | Freq: Four times a day (QID) | ORAL | 0 refills | Status: AC | PRN
Start: 2020-05-13 — End: 2021-05-13

## 2020-05-13 NOTE — Care Management Important Message (Signed)
Important Message  Patient Details  Name: [name] [name] MRN: [id number] Date of Birth: 11-21-1960   Medicare Important Message Given:  Yes     [name] 05/13/2020, 11:18 AM

## 2020-05-13 NOTE — Progress Notes (Cosign Needed)
   Durable Medical Equipment (From admission, onward)       Start     Ordered  Unscheduled   For home use only DME lightweight manual wheelchair with seat cushion  Once      Comments: Patient suffers from left AKA which impairs their ability to perform daily activities like ADLs and mobility in the home.  A walking aid such as a walker will not resolve issue with performing activities of daily living. A wheelchair will allow patient to safely perform daily activities. Patient is not able to propel themselves in the home using a standard weight wheelchair due to weakness. Patient can self propel in the lightweight wheelchair. Length of need Timeframe: Lifetime. Accessories: elevating leg rests (ELRs), wheel locks, extensions and anti-tippers. Back cushion  05/13/20 1437

## 2020-05-13 NOTE — Progress Notes (Signed)
Patient now says there is no one to take him home. Discharge orders will be held.

## 2020-05-13 NOTE — Progress Notes (Addendum)
   VASCULAR SURGERY ASSESSMENT & PLAN:   POD 2 LEFT AKA:  VSS. Afebrile. Left residual limb is well perfused. Flaps warm. Staple line intact. H and H stable.  DVT PROPHYLAXIS: He is on subcu heparin.  Disposition: Home with HHPT, DME.  He feels ready to go home. We will re-assess later after another PT session.   SUBJECTIVE:   Post-op pain is moderate. He is on chronic narcotics for chronic back pain.  PHYSICAL EXAM:   Vitals:   05/12/20 1558 05/12/20 1951 05/12/20 2349 05/13/20 0447  BP: 106/76 118/80 122/80 126/88  Pulse:  83 84 79  Resp: 16 18 13 18   Temp: 97.9 F (36.6 C) 98.1 F (36.7 C) 98 F (36.7 C) 98 F (36.7 C)  TempSrc: Oral Oral Oral Oral  SpO2:  98% 98% 95%  Weight:      Height:       General: awake and alert in NAD.  Resp: non-labored.  LLE: Dressing removed. Flaps warm and well perfused. Staple line intact without bleeding or drainage.  LABS:   Lab Results  Component Value Date   WBC 11.4 (H) 05/13/2020   HGB 8.4 (L) 05/13/2020   HCT 27.7 (L) 05/13/2020   MCV 77.6 (L) 05/13/2020   PLT 340 05/13/2020   Lab Results  Component Value Date   CREATININE 0.89 05/13/2020   Lab Results  Component Value Date   INR 1.1 04/28/2020   CBG (last 3)  No results for input(s): GLUCAP in the last 72 hours.  PROBLEM LIST:    Active Problems:   PAD (peripheral artery disease) (HCC)   CURRENT MEDS:   . amLODipine  10 mg Oral Daily  . ascorbic acid  500 mg Oral QODAY  . atorvastatin  80 mg Oral q1800  . chlorthalidone  25 mg Oral Daily  . docusate sodium  100 mg Oral BID  . heparin  5,000 Units Subcutaneous Q8H  . irbesartan  37.5 mg Oral Daily  . pantoprazole  40 mg Oral Daily  . sodium chloride flush  3 mL Intravenous Q12H   Risa Grill, PA-C Office: [phone] 05/13/2020   I have interviewed the patient and examined the patient. I agree with the findings by the PA.  His amputation site looks fine.  Possibly home later today.  Gae Gallop, MD [phone]

## 2020-05-13 NOTE — Plan of Care (Signed)
Continue to monitor

## 2020-05-13 NOTE — Progress Notes (Signed)
Mobility Specialist - Progress Note   05/13/20 1329  Mobility  Activity Ambulated in room  Level of Assistance Minimal assist, patient does 75% or more  Assistive Device Front wheel walker  Distance Ambulated (ft) 16 ft  Mobility Response Tolerated well  Mobility performed by Mobility specialist  $Mobility charge 1 Mobility    Pre-mobility: 95 HR, 97% SpO2 Post-mobility: 91 HR, 98% SpO2  Pt asx throughout ambulation. He was able to stand w/o assistance or VC. Contact guard for safety.   Pricilla Handler Mobility Specialist Mobility Specialist Phone: [phone]

## 2020-05-13 NOTE — Progress Notes (Signed)
Occupational Therapy Treatment Patient Details Name: [name] [name] MRN: [id number] DOB: 09-21-1960 Today's Date: 05/13/2020    History of present illness 59 y.o. male, PMH: HTN, PAD, DDD,  s/p multiple L LE revascularizations discharged from hospital a 05/05/20.  At that time he was deemed to not be a candidate for further revasc procedures.  Dc'd home with a known bypass occlusion with a plan for AKA if worsening pain. Returned to ED 05/08/20  for increased pain and seeking amputation. s/p L AKA 05/11/20   OT comments  Patient continues to verbalize pain to L residual limb, but demonstrates improved bed mobility, lateral scoot transfers, and in room mobility at RW level.  Patient demonstrates good follow through on all teachings, and should do very well at initial wheelchair level at home with gradual mobility progression with home health services.  Currently he needs min guard for mobility and transfers, and setup and supervision with ADL.  He will have assist as needed at home.  Continue OT in the acute setting.    Follow Up Recommendations  Home health OT    Equipment Recommendations  Wheelchair [phone]);Wheelchair cushion (18x18x2),3 in 1 bedside commode(droparm)   Recommendations for Other Services      Precautions / Restrictions Precautions Precautions: Fall Restrictions LLE Weight Bearing: Non weight bearing       Mobility Bed Mobility Overal bed mobility: Modified Independent                Transfers Overall transfer level: Needs assistance   Transfers: Sit to/from Stand;Stand Pivot Transfers Sit to Stand: Min guard Stand pivot transfers: Min guard       General transfer comment: VC's for RW management and hand placement    Balance   Sitting-balance support: Feet supported Sitting balance-Leahy Scale: Good     Standing balance support: Bilateral upper extremity supported Standing balance-Leahy Scale: Fair Standing balance comment: patient with good  foot placement and better understanding of COG this session                           ADL either performed or assessed with clinical judgement   ADL       Grooming: Set up;Sitting   Upper Body Bathing: Set up;Sitting   Lower Body Bathing: Sit to/from stand;Min guard   Upper Body Dressing : Set up;Sitting   Lower Body Dressing: Min guard;Sit to/from stand   Toilet Transfer: RW;Ambulation;Min guard                                     Cognition Arousal/Alertness: Awake/alert Behavior During Therapy: WFL for tasks assessed/performed Overall Cognitive Status: Within Functional Limits for tasks assessed                                                            Pertinent Vitals/ Pain       Pain Score: 5  Pain Location: stump Pain Descriptors / Indicators: Burning Pain Intervention(s): Monitored during session  Frequency  Min 2X/week        Progress Toward Goals  OT Goals(current goals can now be found in the care plan section)  Progress towards OT goals: Progressing toward goals  Acute Rehab OT Goals Patient Stated Goal: I'd like to get home tomorrow OT Goal Formulation: With patient Time For Goal Achievement: 05/26/20 Potential to Achieve Goals: Good  Plan Discharge plan remains appropriate    Co-evaluation                 AM-PAC OT "6 Clicks" Daily Activity     Outcome Measure   Help from another person eating meals?: None Help from another person taking care of personal grooming?: None Help from another person toileting, which includes using toliet, bedpan, or urinal?: A Little Help from another person bathing (including washing, rinsing, drying)?: A Little Help from another person to put on and taking off regular upper body clothing?: None Help from another person to put on and taking off regular lower body  clothing?: A Little 6 Click Score: 21    End of Session Equipment Utilized During Treatment: Rolling walker;Gait belt  OT Visit Diagnosis: Unsteadiness on feet (R26.81);Other abnormalities of gait and mobility (R26.89);Pain;Muscle weakness (generalized) (M62.81) Pain - Right/Left: Left Pain - part of body: Leg   Activity Tolerance Patient tolerated treatment well   Patient Left in chair;with chair alarm set;with call bell/phone within reach   Nurse Communication Mobility status        Time: 2162-4469 OT Time Calculation (min): 27 min  Charges: OT General Charges $OT Visit: 1 Visit OT Treatments $Self Care/Home Management : 23-37 mins  05/13/2020  Rich, OTR/L  Acute Rehabilitation Services  Office:  [phone]    [name] [name] 05/13/2020, 12:23 PM

## 2020-05-13 NOTE — Care Plan (Deleted)
Patient is stable for dc home with OT.  He will use new rx for percocet for acute post-op pain.

## 2020-05-13 NOTE — TOC Transition Note (Signed)
Transition of Care (TOC) - CM/SW Discharge Note [name] RN, BSN Transitions of Care Unit 4E- RN Case Manager See Treatment Team for direct phone #    Patient Details  Name: [name] [name] MRN: [id number] Date of Birth: 01-07-1961  Transition of Care [facility]) CM/SW Contact:  [name], RN Phone Number: 05/13/2020, 3:28 PM   Clinical Narrative:    Pt stable for transition home today, however per pt he will not have anyone at home to assist him as they will be working until 29midnight tonight. He lives at home with his SO- she does not drive- and her son has moved in to assist them- however he is the one that is working. Will plan to transition home tomorrow for a safe plan to be sure pt. Has someone to assist.  CM spoke with pt at bedside for [facility] and DME needs- per CM notes from last admit pt was set up with [facility] for [facility] and received a RW and 3n1 for home. Choice offered to pt for [facility] needs with list provided Per CMS guidelines from medicare.gov website with star ratings (copy placed in shadow chart)- per pt he does not have a preference and is fine using [facility] if they can provide services. Address, phone #s and PCP all confirmed with pt in epic Pt will need w/c for home this time- order placed- call made to [employer] [facility] line for referral- they will check to see if insurance will cover and deliver to room prior to discharge.   Call made to [facility] with [name] for [facility] referral- per Tommi Rumps they did not get out to see pt on last admit before he was readmitted- agreeable to accept referral again - will need orders placed for HPT/OT    Final next level of care: Home w Home Health Services Barriers to Discharge: Transportation (pt does not have anyone at home today to assist if transported home)   Patient Goals and CMS Choice Patient states their goals for this hospitalization and ongoing recovery are:: to go home CMS Medicare.gov Compare Post Acute Care list provided to::  Patient Choice offered to / list presented to : Patient  Discharge Placement               Home with [facility]        Discharge Plan and Services   Discharge Planning Services: CM Consult Post Acute Care Choice: Home Health, Durable Medical Equipment, Resumption of Svcs/PTA Provider          DME Arranged: Lightweight manual wheelchair with seat cushion DME Agency: [employer] Date DME Agency Contacted: 05/13/20 Time DME Agency Contacted: 1500 Representative spoke with at DME Agency: [place]: PT, OT [place] Agency: [place] Date [place]: 05/13/20 Time [place]: 1528 Representative spoke with at [place]: [place] ([place]) Interventions     Readmission Risk Interventions Readmission Risk Prevention Plan 05/13/2020 05/04/2020 02/27/2019  Post Dischage Appt - Complete -  Medication Screening - Complete -  Transportation Screening Complete Complete Complete  PCP or Specialist Appt within 5-7 Days Complete - Complete  Home Care Screening Complete - Complete  Medication Review (RN CM) Complete - Complete  Some recent data might be hidden

## 2020-05-13 NOTE — Progress Notes (Signed)
Orthopedic Tech Progress Note Patient Details:  [name] [name] 12-Dec-1960 [id number] Called in brace Patient ID: [name], male   DOB: [birth year], 59 y.o.   MRN: [id number]   Ellouise Newer 05/13/2020, 10:08 AM

## 2020-05-14 NOTE — Progress Notes (Addendum)
Orders for discharge received.  IV and telemetry removed, CCMD notified.  Discharge instructions reviewed.  Pt expresses understanding.  W/C for home has been delivered to room.  Ride should be here around 1100 this morning.

## 2020-05-14 NOTE — Discharge Summary (Addendum)
Discharge Summary  Patient ID: [name] [name] [id number] 59 y.o. 1961-03-11  Admit date: 05/08/2020  Discharge date and time: 05/14/2020 12:26 PM   Admitting Physician: Serafina Mitchell, MD   Discharge Physician: Gae Gallop, MD  Admission Diagnoses: PAD (peripheral artery disease) ([place]) [I73.9] Pain in extremity, unspecified extremity [M79.609]  Discharge Diagnoses: PAD (peripheral artery disease) ([place]) [I73.9] Pain in extremity, unspecified extremity [M79.609]  Admission Condition: good  Discharged Condition: good  Indication for Admission: critical lower limb ischemia  Hospital Course: On the day of admission, the patient presented to the emergency room with uncontrolled , worsening left lower extremity pain.  The patient has an extensive past surgical history including multiple revascularizations.  2 days prior he was discharged home from the hospital after undergoing thrombolysis, suction thrombectomy, angioplasty and ultimately exposure of the left below-knee popliteal, anterior tibial, tibioperoneal trunk and posterior tibial peroneal arteries with left lower extremity thromboembolectomy of the bypass graft with stent, left anterior tibial artery.  Endarterectomy of the lower leg vessels also carried out.  Unfortunately, he patient's graft re-thrombosed and he was counseled regarding no further options for revascularization.  He understood the limb threatening situation and opted for continued watchful waiting at that time. Upon admission, He was counseled regarding proceeding with above-knee amputation and he was in agreement with this plan.  He Remained hemodynamically stable throughout his hospitalization.  On May 11, 2020 he was taken to the operating room and underwent left above-knee amputation secondary to ischemic left lower extremity.  He tolerated procedure well.  On postoperative day 1, his hemoglobin was stable and his pain was fairly well controlled.  He  Underwent transition of care, rehab consultation.  On postoperative day 2, his dressing was removed and his incision line was intact.  There was no bleeding or signs of infection.  His flaps were warm and well perfused.  Physical therapy recommended home with home health PT OT.  On postoperative day 3, he continued to do well with pain control and no signs or symptoms of infection.  He was ready for discharge home.  Consults: None  Treatments: left above knee amputation Discharge Exam:  Vitals:   05/14/20 0508 05/14/20 0814  BP: 123/86 119/88  Pulse: 84 90  Resp:    Temp: 98.2 F (36.8 C) 98 F (36.7 C)  SpO2: 96% 92%   Cardiac:  RRR Lungs:  Non-labored Incisions:  Well approximated Extremities:  AKA flaps warm and well perfused. No skin issues of RLE Abdomen:  soft Neurologic: A and O x 4   Disposition: Discharge disposition: 06-Home-Health Care Svc       Patient Instructions:  Allergies as of 05/14/2020      Reactions   Dilaudid [hydromorphone Hcl] Rash   Ramipril Rash, Other (See Comments)   Per patient on R arm and leg only; no angioedema   Tramadol Itching, Rash      Medication List    TAKE these medications   amLODipine 10 MG tablet Commonly known as: NORVASC Take 1 tablet (10 mg total) by mouth daily.   apixaban 5 MG Tabs tablet Commonly known as: ELIQUIS Take 1 tablet (5 mg total) by mouth 2 (two) times daily.   ascorbic acid 500 MG tablet Commonly known as: VITAMIN C Take 500 mg by mouth every other day.   atorvastatin 80 MG tablet Commonly known as: LIPITOR Take 1 tablet (80 mg total) by mouth daily at 6 PM.   chlorthalidone 25 MG tablet Commonly known  as: HYGROTON Take 1 tablet (25 mg total) by mouth daily.   clopidogrel 75 MG tablet Commonly known as: Plavix Take 1 tablet (75 mg total) by mouth daily.   docusate sodium 100 MG capsule Commonly known as: COLACE Take 1 capsule (100 mg total) by mouth daily. What changed:   when to take  this  reasons to take this   gabapentin 100 MG capsule Commonly known as: NEURONTIN Take 100 mg by mouth at bedtime as needed (for pain).   ibuprofen 200 MG tablet Commonly known as: ADVIL Take 200-600 mg by mouth every 6 (six) hours as needed for headache or mild pain.   naloxone 2 MG/2ML injection Commonly known as: NARCAN Inject 2 mg into the muscle once as needed (opiod overdose (call 911, inject intramuscularly in shoulder or thigh. Repeat every 3 minutes)).   oxyCODONE-acetaminophen 10-325 MG tablet Commonly known as: PERCOCET Take 1 tablet by mouth 4 (four) times daily as needed for pain. What changed: Another medication with the same name was added. Make sure you understand how and when to take each.   oxyCODONE-acetaminophen 10-325 MG tablet Commonly known as: Percocet Take 1 tablet by mouth every 6 (six) hours as needed for pain. Take your new prescription for acute post-surgical pain What changed: You were already taking a medication with the same name, and this prescription was added. Make sure you understand how and when to take each.   predniSONE 5 MG tablet Commonly known as: DELTASONE TAKE 1 TABLET BY MOUTH EVERY DAY WITH BREAKFAST What changed: See the new instructions.   REFRESH DRY EYE THERAPY OP Place 1 drop into both eyes 3 (three) times daily as needed (for dryness or irritation).   valsartan 40 MG tablet Commonly known as: DIOVAN Take 40 mg by mouth daily.            Durable Medical Equipment  (From admission, onward)         Start     Ordered   05/13/20 1435  For home use only DME lightweight manual wheelchair with seat cushion  Once       Comments: Patient suffers from left AKA which impairs their ability to perform daily activities like ADLs and mobility in the home.  A walking aid such as a walker will not resolve issue with performing activities of daily living. A wheelchair will allow patient to safely perform daily activities. Patient is  not able to propel themselves in the home using a standard weight wheelchair due to weakness. Patient can self propel in the lightweight wheelchair. Length of need Timeframe: Lifetime. Accessories: elevating leg rests (ELRs), wheel locks, extensions and anti-tippers. Back cushion   05/13/20 1437         Activity: ambulate in house Diet: cardiac diet Wound Care: keep wound clean and dry  Follow-up with Scot Dock in 4 weeks.  Signed: Barbie Banner, PA-C 05/14/2020 12:38 PM [facility] Office: [phone]

## 2020-05-14 NOTE — Progress Notes (Signed)
Physical Therapy Treatment Patient Details Name: [name] [name] MRN: [id number] DOB: 1961/01/12 Today's Date: 05/14/2020    History of Present Illness 59 y.o. male, PMH: HTN, PAD, DDD,  s/p multiple L LE revascularizations discharged from hospital a 05/05/20.  At that time he was deemed to not be a candidate for further revasc procedures.  Dc'd home with a known bypass occlusion with a plan for AKA if worsening pain. Returned to ED 05/08/20  for increased pain and seeking amputation. s/p L AKA 05/11/20    PT Comments    Patient progressing well towards PT goals. Improved ambulation distance with use of RW and Min guard assist for safety. Fatigues quickly. Practiced w/c mobility going ~120' with supervision. Instructed pt on how to unlock/lock brakes, manage leg rests and position w/c for safe transfers. Discussed safe stair negotiation and recommendation of family member to help out for safety. Reviewed left residual limb positioning and there ex to improve overall ROM for a prosthesis in the future. Pt eager to return home. Will follow.    Follow Up Recommendations  Home health PT;Supervision/Assistance - 24 hour     Equipment Recommendations  Wheelchair (measurements PT);Wheelchair cushion (measurements PT);3in1 (PT)    Recommendations for Other Services       Precautions / Restrictions Precautions Precautions: Fall Restrictions Weight Bearing Restrictions: No    Mobility  Bed Mobility Overal bed mobility: Modified Independent             General bed mobility comments: HoB elevated, vc for use of bedrail  Transfers Overall transfer level: Needs assistance Equipment used: Rolling walker (2 wheeled);None Transfers: Sit to/from [employer] to Stand: Min guard Stand pivot transfers: Min guard       General transfer comment: Use of momentum to stand from EOB with multiple attempts, SPT w/c to bed with Min guard for safety instructing pt how to  lock/unlock brakes and positioning for transfer.  Ambulation/Gait Ambulation/Gait assistance: Min guard Gait Distance (Feet): 10 Feet Assistive device: Rolling walker (2 wheeled) Gait Pattern/deviations:  ("hop to") Gait velocity: slowed Gait velocity interpretation: <1.31 ft/sec, indicative of household ambulator General Gait Details: "hop to" gait pattern wtih use of RW; knee flexion throughout on RLE.   Stairs             Information systems manager mobility: Yes Wheelchair propulsion: Both upper extremities Wheelchair parts: Supervision/cueing Distance: ~120' Education administrator Details (indicate cue type and reason): Able to manuever pretty well, needed cues on how to lock/unlock brakes, adjust leg rests and swing them out of the way etc.  Modified Rankin (Stroke Patients Only)       Balance Overall balance assessment: Needs assistance Sitting-balance support: No upper extremity supported (foot supported) Sitting balance-Leahy Scale: Good     Standing balance support: During functional activity Standing balance-Leahy Scale: Poor Standing balance comment: Requires UE support for dynamic tasks.                            Cognition Arousal/Alertness: Awake/alert Behavior During Therapy: WFL for tasks assessed/performed Overall Cognitive Status: Within Functional Limits for tasks assessed                                        Exercises Amputee Exercises Hip Extension: AROM;Left;5 reps;Standing    General Comments General comments (skin integrity,  edema, etc.): No dizziness reported this session. Discussed stair negotiation technique as pt has 2 handrails he can reach and use to hop up the steps. Encouraged to have family member behind him to assist vs bumping up in a w/c.      Pertinent Vitals/Pain Pain Assessment: Faces Faces Pain Scale: Hurts even more Pain Location: left residual limb Pain  Descriptors / Indicators: Operative site guarding;Sore Pain Intervention(s): Monitored during session;Repositioned    Home Living                      Prior Function            PT Goals (current goals can now be found in the care plan section) Progress towards PT goals: Progressing toward goals    Frequency    Min 3X/week      PT Plan Current plan remains appropriate    Co-evaluation              AM-PAC PT "6 Clicks" Mobility   Outcome Measure  Help needed turning from your back to your side while in a flat bed without using bedrails?: None Help needed moving from lying on your back to sitting on the side of a flat bed without using bedrails?: None Help needed moving to and from a bed to a chair (including a wheelchair)?: A Little Help needed standing up from a chair using your arms (e.g., wheelchair or bedside chair)?: A Little Help needed to walk in hospital room?: A Little Help needed climbing 3-5 steps with a railing? : A Lot 6 Click Score: 19    End of Session Equipment Utilized During Treatment: Gait belt Activity Tolerance: Patient tolerated treatment well Patient left: in bed;with call bell/phone within reach;with bed alarm set Nurse Communication: Mobility status PT Visit Diagnosis: Unsteadiness on feet (R26.81);Other abnormalities of gait and mobility (R26.89);Ataxic gait (R26.0);Difficulty in walking, not elsewhere classified (R26.2);Pain Pain - Right/Left: Left Pain - part of body: Leg     Time: 1540-0867 PT Time Calculation (min) (ACUTE ONLY): 15 min  Charges:  $Therapeutic Activity: 8-22 mins                     [name] [name], PT, DPT Acute Rehabilitation Services Pager [phone] Office [phone]       [name] 05/14/2020, 10:52 AM

## 2020-05-18 DIAGNOSIS — M4326 Fusion of spine, lumbar region: Secondary | ICD-10-CM | POA: Diagnosis not present

## 2020-05-18 DIAGNOSIS — M5136 Other intervertebral disc degeneration, lumbar region: Secondary | ICD-10-CM | POA: Diagnosis not present

## 2020-05-18 DIAGNOSIS — Z79891 Long term (current) use of opiate analgesic: Secondary | ICD-10-CM | POA: Diagnosis not present

## 2020-05-18 DIAGNOSIS — G894 Chronic pain syndrome: Secondary | ICD-10-CM | POA: Diagnosis not present

## 2020-05-19 DIAGNOSIS — Z7901 Long term (current) use of anticoagulants: Secondary | ICD-10-CM | POA: Diagnosis not present

## 2020-05-19 DIAGNOSIS — M5117 Intervertebral disc disorders with radiculopathy, lumbosacral region: Secondary | ICD-10-CM | POA: Diagnosis not present

## 2020-05-19 DIAGNOSIS — G4700 Insomnia, unspecified: Secondary | ICD-10-CM | POA: Diagnosis not present

## 2020-05-19 DIAGNOSIS — D509 Iron deficiency anemia, unspecified: Secondary | ICD-10-CM | POA: Diagnosis not present

## 2020-05-19 DIAGNOSIS — Z9181 History of falling: Secondary | ICD-10-CM | POA: Diagnosis not present

## 2020-05-19 DIAGNOSIS — M1612 Unilateral primary osteoarthritis, left hip: Secondary | ICD-10-CM | POA: Diagnosis not present

## 2020-05-19 DIAGNOSIS — F17210 Nicotine dependence, cigarettes, uncomplicated: Secondary | ICD-10-CM | POA: Diagnosis not present

## 2020-05-19 DIAGNOSIS — K219 Gastro-esophageal reflux disease without esophagitis: Secondary | ICD-10-CM | POA: Diagnosis not present

## 2020-05-19 DIAGNOSIS — Z4781 Encounter for orthopedic aftercare following surgical amputation: Secondary | ICD-10-CM | POA: Diagnosis not present

## 2020-05-19 DIAGNOSIS — M069 Rheumatoid arthritis, unspecified: Secondary | ICD-10-CM | POA: Diagnosis not present

## 2020-05-19 DIAGNOSIS — Z89612 Acquired absence of left leg above knee: Secondary | ICD-10-CM | POA: Diagnosis not present

## 2020-05-19 DIAGNOSIS — Z7902 Long term (current) use of antithrombotics/antiplatelets: Secondary | ICD-10-CM | POA: Diagnosis not present

## 2020-05-19 DIAGNOSIS — E785 Hyperlipidemia, unspecified: Secondary | ICD-10-CM | POA: Diagnosis not present

## 2020-05-19 DIAGNOSIS — I10 Essential (primary) hypertension: Secondary | ICD-10-CM | POA: Diagnosis not present

## 2020-05-19 DIAGNOSIS — I739 Peripheral vascular disease, unspecified: Secondary | ICD-10-CM | POA: Diagnosis not present

## 2020-05-19 DIAGNOSIS — G8929 Other chronic pain: Secondary | ICD-10-CM | POA: Diagnosis not present

## 2020-05-25 ENCOUNTER — Encounter (HOSPITAL_COMMUNITY): Payer: Medicare Other

## 2020-05-25 ENCOUNTER — Ambulatory Visit: Payer: Medicare Other | Admitting: Vascular Surgery

## 2020-05-25 ENCOUNTER — Other Ambulatory Visit (HOSPITAL_COMMUNITY): Payer: Medicare Other

## 2020-05-25 DIAGNOSIS — Z7902 Long term (current) use of antithrombotics/antiplatelets: Secondary | ICD-10-CM | POA: Diagnosis not present

## 2020-05-25 DIAGNOSIS — M5117 Intervertebral disc disorders with radiculopathy, lumbosacral region: Secondary | ICD-10-CM | POA: Diagnosis not present

## 2020-05-25 DIAGNOSIS — D509 Iron deficiency anemia, unspecified: Secondary | ICD-10-CM | POA: Diagnosis not present

## 2020-05-25 DIAGNOSIS — Z7901 Long term (current) use of anticoagulants: Secondary | ICD-10-CM | POA: Diagnosis not present

## 2020-05-25 DIAGNOSIS — F17210 Nicotine dependence, cigarettes, uncomplicated: Secondary | ICD-10-CM | POA: Diagnosis not present

## 2020-05-25 DIAGNOSIS — E785 Hyperlipidemia, unspecified: Secondary | ICD-10-CM | POA: Diagnosis not present

## 2020-05-25 DIAGNOSIS — K219 Gastro-esophageal reflux disease without esophagitis: Secondary | ICD-10-CM | POA: Diagnosis not present

## 2020-05-25 DIAGNOSIS — G4700 Insomnia, unspecified: Secondary | ICD-10-CM | POA: Diagnosis not present

## 2020-05-25 DIAGNOSIS — G8929 Other chronic pain: Secondary | ICD-10-CM | POA: Diagnosis not present

## 2020-05-25 DIAGNOSIS — I10 Essential (primary) hypertension: Secondary | ICD-10-CM | POA: Diagnosis not present

## 2020-05-25 DIAGNOSIS — Z89612 Acquired absence of left leg above knee: Secondary | ICD-10-CM | POA: Diagnosis not present

## 2020-05-25 DIAGNOSIS — M1612 Unilateral primary osteoarthritis, left hip: Secondary | ICD-10-CM | POA: Diagnosis not present

## 2020-05-25 DIAGNOSIS — Z9181 History of falling: Secondary | ICD-10-CM | POA: Diagnosis not present

## 2020-05-25 DIAGNOSIS — I739 Peripheral vascular disease, unspecified: Secondary | ICD-10-CM | POA: Diagnosis not present

## 2020-05-25 DIAGNOSIS — Z4781 Encounter for orthopedic aftercare following surgical amputation: Secondary | ICD-10-CM | POA: Diagnosis not present

## 2020-05-25 DIAGNOSIS — M069 Rheumatoid arthritis, unspecified: Secondary | ICD-10-CM | POA: Diagnosis not present

## 2020-05-26 ENCOUNTER — Telehealth: Payer: Self-pay

## 2020-05-26 DIAGNOSIS — F17210 Nicotine dependence, cigarettes, uncomplicated: Secondary | ICD-10-CM | POA: Diagnosis not present

## 2020-05-26 DIAGNOSIS — I10 Essential (primary) hypertension: Secondary | ICD-10-CM | POA: Diagnosis not present

## 2020-05-26 DIAGNOSIS — E785 Hyperlipidemia, unspecified: Secondary | ICD-10-CM | POA: Diagnosis not present

## 2020-05-26 DIAGNOSIS — M5117 Intervertebral disc disorders with radiculopathy, lumbosacral region: Secondary | ICD-10-CM | POA: Diagnosis not present

## 2020-05-26 DIAGNOSIS — G8929 Other chronic pain: Secondary | ICD-10-CM | POA: Diagnosis not present

## 2020-05-26 DIAGNOSIS — Z7901 Long term (current) use of anticoagulants: Secondary | ICD-10-CM | POA: Diagnosis not present

## 2020-05-26 DIAGNOSIS — Z89612 Acquired absence of left leg above knee: Secondary | ICD-10-CM | POA: Diagnosis not present

## 2020-05-26 DIAGNOSIS — I739 Peripheral vascular disease, unspecified: Secondary | ICD-10-CM | POA: Diagnosis not present

## 2020-05-26 DIAGNOSIS — G4700 Insomnia, unspecified: Secondary | ICD-10-CM | POA: Diagnosis not present

## 2020-05-26 DIAGNOSIS — Z7902 Long term (current) use of antithrombotics/antiplatelets: Secondary | ICD-10-CM | POA: Diagnosis not present

## 2020-05-26 DIAGNOSIS — M1612 Unilateral primary osteoarthritis, left hip: Secondary | ICD-10-CM | POA: Diagnosis not present

## 2020-05-26 DIAGNOSIS — Z4781 Encounter for orthopedic aftercare following surgical amputation: Secondary | ICD-10-CM | POA: Diagnosis not present

## 2020-05-26 DIAGNOSIS — K219 Gastro-esophageal reflux disease without esophagitis: Secondary | ICD-10-CM | POA: Diagnosis not present

## 2020-05-26 DIAGNOSIS — D509 Iron deficiency anemia, unspecified: Secondary | ICD-10-CM | POA: Diagnosis not present

## 2020-05-26 DIAGNOSIS — Z9181 History of falling: Secondary | ICD-10-CM | POA: Diagnosis not present

## 2020-05-26 DIAGNOSIS — M069 Rheumatoid arthritis, unspecified: Secondary | ICD-10-CM | POA: Diagnosis not present

## 2020-05-26 NOTE — Telephone Encounter (Signed)
PT called, patient's stump is a little red around the staples and has gotten that way over the past few days. Vitals are normal/no fever per PT. Denies drainage or swelling. Instructed patient to call tomorrow if not improving. Will schedule sooner f/u wound check visit if indicated. Patient in agreement.

## 2020-05-26 NOTE — Telephone Encounter (Signed)
Opened in error

## 2020-05-28 ENCOUNTER — Telehealth: Payer: Self-pay

## 2020-05-28 NOTE — Telephone Encounter (Signed)
Left message to call us back if they still need assistance.

## 2020-06-03 DIAGNOSIS — D509 Iron deficiency anemia, unspecified: Secondary | ICD-10-CM | POA: Diagnosis not present

## 2020-06-03 DIAGNOSIS — K219 Gastro-esophageal reflux disease without esophagitis: Secondary | ICD-10-CM | POA: Diagnosis not present

## 2020-06-03 DIAGNOSIS — G8929 Other chronic pain: Secondary | ICD-10-CM | POA: Diagnosis not present

## 2020-06-03 DIAGNOSIS — G4700 Insomnia, unspecified: Secondary | ICD-10-CM | POA: Diagnosis not present

## 2020-06-03 DIAGNOSIS — Z89612 Acquired absence of left leg above knee: Secondary | ICD-10-CM | POA: Diagnosis not present

## 2020-06-03 DIAGNOSIS — E785 Hyperlipidemia, unspecified: Secondary | ICD-10-CM | POA: Diagnosis not present

## 2020-06-03 DIAGNOSIS — Z7901 Long term (current) use of anticoagulants: Secondary | ICD-10-CM | POA: Diagnosis not present

## 2020-06-03 DIAGNOSIS — I739 Peripheral vascular disease, unspecified: Secondary | ICD-10-CM | POA: Diagnosis not present

## 2020-06-03 DIAGNOSIS — I10 Essential (primary) hypertension: Secondary | ICD-10-CM | POA: Diagnosis not present

## 2020-06-03 DIAGNOSIS — M069 Rheumatoid arthritis, unspecified: Secondary | ICD-10-CM | POA: Diagnosis not present

## 2020-06-03 DIAGNOSIS — M1612 Unilateral primary osteoarthritis, left hip: Secondary | ICD-10-CM | POA: Diagnosis not present

## 2020-06-03 DIAGNOSIS — F17210 Nicotine dependence, cigarettes, uncomplicated: Secondary | ICD-10-CM | POA: Diagnosis not present

## 2020-06-03 DIAGNOSIS — Z4781 Encounter for orthopedic aftercare following surgical amputation: Secondary | ICD-10-CM | POA: Diagnosis not present

## 2020-06-03 DIAGNOSIS — M5117 Intervertebral disc disorders with radiculopathy, lumbosacral region: Secondary | ICD-10-CM | POA: Diagnosis not present

## 2020-06-03 DIAGNOSIS — Z7902 Long term (current) use of antithrombotics/antiplatelets: Secondary | ICD-10-CM | POA: Diagnosis not present

## 2020-06-03 DIAGNOSIS — Z9181 History of falling: Secondary | ICD-10-CM | POA: Diagnosis not present

## 2020-06-10 ENCOUNTER — Ambulatory Visit (INDEPENDENT_AMBULATORY_CARE_PROVIDER_SITE_OTHER): Payer: Self-pay | Admitting: Physician Assistant

## 2020-06-10 ENCOUNTER — Other Ambulatory Visit: Payer: Self-pay

## 2020-06-10 VITALS — BP 106/70 | HR 82 | Temp 98.8°F | Resp 20 | Ht 68.0 in

## 2020-06-10 DIAGNOSIS — I739 Peripheral vascular disease, unspecified: Secondary | ICD-10-CM

## 2020-06-10 NOTE — Progress Notes (Signed)
POST OPERATIVE OFFICE NOTE    CC:  F/u for surgery  HPI:  This is a 59 y.o. male who is s/p left above knee amputation.  He had previously undergone left femoral to below-knee popliteal bypass with PTFE graft.  This past summer this occluded he underwent lysis and mechanical embolectomy.  He was placed on Eliquis.  Unfortunately his graft failed again and he required above-knee amputation.  He denies fever, chills, postoperative pain or right lower extremity pain.  Review of his aortogram performed in August 2020 revealed patent right lower extremity vessels with single-vessel runoff via anterior tibial artery.  His ABIs in August were within normal limits.  Currently compliant with statin, Plavix and Eliquis. He is not diabetic. Non-smoker. Allergies  Allergen Reactions  . Dilaudid [Hydromorphone Hcl] Rash  . Ramipril Rash and Other (See Comments)    Per patient on R arm and leg only; no angioedema  . Tramadol Itching and Rash    Current Outpatient Medications  Medication Sig Dispense Refill  . amLODipine (NORVASC) 10 MG tablet Take 1 tablet (10 mg total) by mouth daily. 90 tablet 1  . apixaban (ELIQUIS) 5 MG TABS tablet Take 1 tablet (5 mg total) by mouth 2 (two) times daily. 60 tablet 3  . ascorbic acid (VITAMIN C) 500 MG tablet Take 500 mg by mouth every other day.     [name] atorvastatin (LIPITOR) 80 MG tablet Take 1 tablet (80 mg total) by mouth daily at 6 PM. 30 tablet 11  . chlorthalidone (HYGROTON) 25 MG tablet Take 1 tablet (25 mg total) by mouth daily. 30 tablet 5  . clopidogrel (PLAVIX) 75 MG tablet Take 1 tablet (75 mg total) by mouth daily. 30 tablet 11  . docusate sodium (COLACE) 100 MG capsule Take 1 capsule (100 mg total) by mouth daily. (Patient taking differently: Take 100 mg by mouth daily as needed for mild constipation.) 10 capsule 0  . gabapentin (NEURONTIN) 100 MG capsule Take 100 mg by mouth at bedtime as needed (for pain).     . Glycerin-Polysorbate 80 (REFRESH DRY  EYE THERAPY OP) Place 1 drop into both eyes 3 (three) times daily as needed (for dryness or irritation).     [name] ibuprofen (ADVIL) 200 MG tablet Take 200-600 mg by mouth every 6 (six) hours as needed for headache or mild pain.    . naloxone (NARCAN) 2 MG/2ML injection Inject 2 mg into the muscle once as needed (opiod overdose (call 911, inject intramuscularly in shoulder or thigh. Repeat every 3 minutes)).     [name] oxyCODONE-acetaminophen (PERCOCET) 10-325 MG tablet Take 1 tablet by mouth 4 (four) times daily as needed for pain.     [name] oxyCODONE-acetaminophen (PERCOCET) 10-325 MG tablet Take 1 tablet by mouth every 6 (six) hours as needed for pain. Take your new prescription for acute post-surgical pain 20 tablet 0  . predniSONE (DELTASONE) 5 MG tablet TAKE 1 TABLET BY MOUTH EVERY DAY WITH BREAKFAST (Patient taking differently: Take 5 mg by mouth daily.) 30 tablet 5  . valsartan (DIOVAN) 40 MG tablet Take 40 mg by mouth daily.     No current facility-administered medications for this visit.     ROS:  See HPI  BP 106/70 (BP Location: Right Arm, Patient Position: Sitting, Cuff Size: Normal)   Pulse 82   Temp 98.8 F (37.1 C) (Temporal)   Resp 20   Ht 5\' 8"  (1.727 m)   SpO2 96%   BMI 26.61 kg/m  Physical Exam:  General appearance: WD, WN in NAD.  Arrives in wheelchair Cardiac: RRR Respiratory: nonlabored Incision:  Well approximated. Erythema of medial aspect.  No drainage or signs of infection. Neuro: Alert and oriented x4   Assessment/Plan:  This is a 59 y.o. male who is s/p: Left above-knee amputation.  We will discontinue his surgical staples today.  Discontinue Eliquis.  Continue Plavix and statin.  Referral made for [facility].  I encouraged him to call our office should he develop any drainage or separation of his skin edges at his left AKA incision site.  We discussed signs and symptoms of lower extremity ischemia and advised him to call us should he develop skin issues of his  right foot or right lower extremity claudication or rest pain.  We will follow-up with ABIs in 1 year.    Risa Grill, PA-C [facility] [phone]  Clinic MD: Scot Dock

## 2020-06-10 NOTE — Progress Notes (Deleted)
  POST OPERATIVE OFFICE NOTE    CC:  F/u for surgery  HPI:  This is a 59 y.o. male who is s/p ***  Allergies  Allergen Reactions  . Dilaudid [Hydromorphone Hcl] Rash  . Ramipril Rash and Other (See Comments)    Per patient on R arm and leg only; no angioedema  . Tramadol Itching and Rash    Current Outpatient Medications  Medication Sig Dispense Refill  . amLODipine (NORVASC) 10 MG tablet Take 1 tablet (10 mg total) by mouth daily. 90 tablet 1  . apixaban (ELIQUIS) 5 MG TABS tablet Take 1 tablet (5 mg total) by mouth 2 (two) times daily. 60 tablet 3  . ascorbic acid (VITAMIN C) 500 MG tablet Take 500 mg by mouth every other day.     [name] atorvastatin (LIPITOR) 80 MG tablet Take 1 tablet (80 mg total) by mouth daily at 6 PM. 30 tablet 11  . chlorthalidone (HYGROTON) 25 MG tablet Take 1 tablet (25 mg total) by mouth daily. 30 tablet 5  . clopidogrel (PLAVIX) 75 MG tablet Take 1 tablet (75 mg total) by mouth daily. 30 tablet 11  . docusate sodium (COLACE) 100 MG capsule Take 1 capsule (100 mg total) by mouth daily. (Patient taking differently: Take 100 mg by mouth daily as needed for mild constipation.) 10 capsule 0  . gabapentin (NEURONTIN) 100 MG capsule Take 100 mg by mouth at bedtime as needed (for pain).     . Glycerin-Polysorbate 80 (REFRESH DRY EYE THERAPY OP) Place 1 drop into both eyes 3 (three) times daily as needed (for dryness or irritation).     [name] ibuprofen (ADVIL) 200 MG tablet Take 200-600 mg by mouth every 6 (six) hours as needed for headache or mild pain.    . naloxone (NARCAN) 2 MG/2ML injection Inject 2 mg into the muscle once as needed (opiod overdose (call 911, inject intramuscularly in shoulder or thigh. Repeat every 3 minutes)).     [name] oxyCODONE-acetaminophen (PERCOCET) 10-325 MG tablet Take 1 tablet by mouth 4 (four) times daily as needed for pain.     [name] oxyCODONE-acetaminophen (PERCOCET) 10-325 MG tablet Take 1 tablet by mouth every 6 (six) hours as needed for pain.  Take your new prescription for acute post-surgical pain 20 tablet 0  . predniSONE (DELTASONE) 5 MG tablet TAKE 1 TABLET BY MOUTH EVERY DAY WITH BREAKFAST (Patient taking differently: Take 5 mg by mouth daily.) 30 tablet 5  . valsartan (DIOVAN) 40 MG tablet Take 40 mg by mouth daily.     No current facility-administered medications for this visit.     ROS:  See HPI  BP 106/70 (BP Location: Right Arm, Patient Position: Sitting, Cuff Size: Normal)   Pulse 82   Temp 98.8 F (37.1 C) (Temporal)   Resp 20   Ht 5\' 8"  (1.727 m)   SpO2 96%   BMI 26.61 kg/m   Physical Exam:  General appearance:*** Cardiac:*** Respiratory:*** Incision:  *** Extremities:  *** Neuro: *** Abdomen:  ***  Assessment/Plan:  This is a 59 y.o. male who is s/p:***    Risa Grill, PA-C [facility] [phone]  Clinic MD:  ***

## 2020-06-15 DIAGNOSIS — Z79891 Long term (current) use of opiate analgesic: Secondary | ICD-10-CM | POA: Diagnosis not present

## 2020-06-15 DIAGNOSIS — M4326 Fusion of spine, lumbar region: Secondary | ICD-10-CM | POA: Diagnosis not present

## 2020-06-15 DIAGNOSIS — M5136 Other intervertebral disc degeneration, lumbar region: Secondary | ICD-10-CM | POA: Diagnosis not present

## 2020-06-15 DIAGNOSIS — G894 Chronic pain syndrome: Secondary | ICD-10-CM | POA: Diagnosis not present

## 2020-06-26 ENCOUNTER — Emergency Department (HOSPITAL_COMMUNITY)
Admission: EM | Admit: 2020-06-26 | Discharge: 2020-06-27 | Disposition: A | Discharge status: Home / Self Care | Payer: Medicare Other | Attending: Emergency Medicine | Admitting: Emergency Medicine

## 2020-06-26 ENCOUNTER — Other Ambulatory Visit: Payer: Self-pay

## 2020-06-26 ENCOUNTER — Emergency Department (HOSPITAL_COMMUNITY): Payer: Medicare Other

## 2020-06-26 DIAGNOSIS — F17210 Nicotine dependence, cigarettes, uncomplicated: Secondary | ICD-10-CM | POA: Diagnosis not present

## 2020-06-26 DIAGNOSIS — U071 COVID-19: Secondary | ICD-10-CM | POA: Diagnosis not present

## 2020-06-26 DIAGNOSIS — I10 Essential (primary) hypertension: Secondary | ICD-10-CM | POA: Diagnosis not present

## 2020-06-26 DIAGNOSIS — R0602 Shortness of breath: Secondary | ICD-10-CM

## 2020-06-26 DIAGNOSIS — R531 Weakness: Secondary | ICD-10-CM | POA: Diagnosis not present

## 2020-06-26 DIAGNOSIS — I70229 Atherosclerosis of native arteries of extremities with rest pain, unspecified extremity: Secondary | ICD-10-CM | POA: Diagnosis not present

## 2020-06-26 DIAGNOSIS — Z79899 Other long term (current) drug therapy: Secondary | ICD-10-CM | POA: Insufficient documentation

## 2020-06-26 DIAGNOSIS — R059 Cough, unspecified: Secondary | ICD-10-CM | POA: Diagnosis not present

## 2020-06-26 DIAGNOSIS — Z743 Need for continuous supervision: Secondary | ICD-10-CM | POA: Diagnosis not present

## 2020-06-26 DIAGNOSIS — E876 Hypokalemia: Secondary | ICD-10-CM | POA: Diagnosis not present

## 2020-06-26 DIAGNOSIS — I2699 Other pulmonary embolism without acute cor pulmonale: Secondary | ICD-10-CM | POA: Diagnosis not present

## 2020-06-26 DIAGNOSIS — R9431 Abnormal electrocardiogram [ECG] [EKG]: Secondary | ICD-10-CM | POA: Diagnosis not present

## 2020-06-26 LAB — RESP PANEL BY RT-PCR (FLU A&B, COVID) ARPGX2
Influenza A by PCR: NEGATIVE
Influenza B by PCR: NEGATIVE
SARS Coronavirus 2 by RT PCR: POSITIVE — AB

## 2020-06-26 LAB — BASIC METABOLIC PANEL
Anion gap: 13 (ref 5–15)
BUN: 13 mg/dL (ref 6–20)
CO2: 28 mmol/L (ref 22–32)
Calcium: 9.2 mg/dL (ref 8.9–10.3)
Chloride: 96 mmol/L — ABNORMAL LOW (ref 98–111)
Creatinine, Ser: 0.96 mg/dL (ref 0.61–1.24)
GFR, Estimated: >60 mL/min (ref >60)
Glucose, Bld: 104 mg/dL — ABNORMAL HIGH (ref 70–99)
Potassium: 2.6 mmol/L — CL (ref 3.5–5.1)
Sodium: 137 mmol/L (ref 135–145)

## 2020-06-26 LAB — CBC
HCT: 34.4 % — ABNORMAL LOW (ref 39.0–52.0)
Hemoglobin: 10 g/dL — ABNORMAL LOW (ref 13.0–17.0)
MCH: 22 pg — ABNORMAL LOW (ref 26.0–34.0)
MCHC: 29.1 g/dL — ABNORMAL LOW (ref 30.0–36.0)
MCV: 75.6 fL — ABNORMAL LOW (ref 80.0–100.0)
Platelets: 247 10*3/uL (ref 150–400)
RBC: 4.55 MIL/uL (ref 4.22–5.81)
RDW: 16 % — ABNORMAL HIGH (ref 11.5–15.5)
WBC: 8 10*3/uL (ref 4.0–10.5)
nRBC: 0 % (ref 0.0–0.2)

## 2020-06-26 MED ORDER — MAGNESIUM SULFATE 2 GM/50ML IV SOLN
2.0000 g | Freq: Once | INTRAVENOUS | Status: AC
  Administered 2020-06-26: 2 g via INTRAVENOUS
  Filled 2020-06-26: qty 50

## 2020-06-26 MED ORDER — SODIUM CHLORIDE 0.9 % IV BOLUS
1000.0000 mL | Freq: Once | INTRAVENOUS | Status: AC
  Administered 2020-06-26: 1000 mL via INTRAVENOUS

## 2020-06-26 MED ORDER — POTASSIUM CHLORIDE 10 MEQ/100ML IV SOLN
10.0000 meq | INTRAVENOUS | Status: AC
  Administered 2020-06-26 (×2): 10 meq via INTRAVENOUS
  Filled 2020-06-26 (×2): qty 100

## 2020-06-26 MED ORDER — OXYCODONE HCL 5 MG PO TABS: 5.0000 mg | ORAL_TABLET | Freq: Once | ORAL | Status: DC

## 2020-06-26 MED ORDER — POTASSIUM CHLORIDE CRYS ER 20 MEQ PO TBCR: 20.0000 meq | EXTENDED_RELEASE_TABLET | Freq: Two times a day (BID) | ORAL | 0 refills | Status: DC

## 2020-06-26 MED ORDER — POTASSIUM CHLORIDE CRYS ER 20 MEQ PO TBCR
40.0000 meq | EXTENDED_RELEASE_TABLET | Freq: Once | ORAL | Status: AC
  Administered 2020-06-26: 40 meq via ORAL
  Filled 2020-06-26: qty 2

## 2020-06-26 MED ORDER — BENZONATATE 100 MG PO CAPS: 100.0000 mg | ORAL_CAPSULE | Freq: Three times a day (TID) | ORAL | 0 refills | Status: DC

## 2020-06-26 MED ORDER — OXYCODONE-ACETAMINOPHEN 5-325 MG PO TABS
1.0000 | ORAL_TABLET | Freq: Once | ORAL | Status: AC
  Administered 2020-06-26: 1 via ORAL
  Filled 2020-06-26: qty 1

## 2020-06-26 MED ORDER — ALBUTEROL SULFATE HFA 108 (90 BASE) MCG/ACT IN AERS: 2.0000 | INHALATION_SPRAY | RESPIRATORY_TRACT | Status: DC | PRN

## 2020-06-26 MED ORDER — IOHEXOL 350 MG/ML SOLN
100.0000 mL | Freq: Once | INTRAVENOUS | Status: AC | PRN
  Administered 2020-06-26: 100 mL via INTRAVENOUS

## 2020-06-26 NOTE — ED Notes (Signed)
Patient transported to CT 

## 2020-06-26 NOTE — ED Notes (Signed)
Pt bp trending down md notified.

## 2020-06-26 NOTE — ED Notes (Signed)
Pt difficult stick Iv team notified.

## 2020-06-26 NOTE — Discharge Instructions (Addendum)
I have called the hospital at home program, who will call you tomorrow and continue to monitor your Covid symptoms.  Continue taking home medications as prescribed.  Take potassium 2 times a day and recheck your potassium levels with your primary care doctor in 2 weeks.  Continue to treat your covid symptoms as needed-- tylenol as needed for pain. Cough medicine as needed.  Return to the ER with any new, worsening, or concerning symptoms.

## 2020-06-26 NOTE — ED Triage Notes (Signed)
Pt presents to ED BIB [facility]. Pt c/o productive cough, Sob. Pt also c/o lightheadedness, generalized weakness, and back pain.   EMS VS 106 palp HR - 92 RR - 18 97% CBG - 97

## 2020-06-27 ENCOUNTER — Encounter: Payer: Self-pay | Admitting: Infectious Diseases

## 2020-06-27 DIAGNOSIS — U071 COVID-19: Secondary | ICD-10-CM | POA: Diagnosis not present

## 2020-06-27 MED ORDER — OXYCODONE-ACETAMINOPHEN 5-325 MG PO TABS
1.0000 | ORAL_TABLET | Freq: Once | ORAL | Status: AC
  Administered 2020-06-27: 1 via ORAL
  Filled 2020-06-27: qty 1

## 2020-06-27 NOTE — ED Notes (Signed)
Patient verbalized understanding of discharge instructions. Opportunity for questions and answers.  

## 2020-06-27 NOTE — ED Provider Notes (Signed)
[facility] EMERGENCY DEPARTMENT Provider Note   CSN: [id number] Arrival date & time: 06/26/20  1649     History Chief Complaint  Patient presents with  . Cough  . Shortness of Breath    [name] [name] is a 60 y.o. male presenting for evaluation of cough and SOB.   Pt states for the past 6 days, he has had cough and intermittent sob. Associated back pain and weakness. Pt recently learned he had a covid exposure. He is unvaccined. He denies fevers, but states he is sweating a lot. Decreased PO intake and associated loss of taste and smell. He denies current SOB or CP. No n/v/d.  He denies recent medication changes.  Additional history obtained from chart review.  Patient with a history of anemia, GERD, hypertension, hypokalemia, lupus, PAD/PVD status post left AKA (05/11/2020), RA. Pt is on plavix, recently stopped eliquis and was changed to plavix.  HPI     Past Medical History:  Diagnosis Date  . Anemia   . Anemia 08/06/2012  . 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 Community acquired pneumonia 11/07/2011  . DDD (degenerative disc disease), lumbosacral     and grade 2 slip  . GERD (gastroesophageal reflux disease)   . Heart murmur   . History of anemia    no current med.  . Hypertension    states is borderline on medication; has been on med. x 5-6 yr.  . Hypokalemia 05/30/2018  . Impingement syndrome of shoulder region 10/2014   left  . Insomnia 06/01/2015  . Ischemic ulcer of toe of left foot ([place])   . Left shoulder pain 12/16/2015  . Low back pain without sciatica 10/29/2009  . Lupus ([place])   . Peripheral vascular disease ([place])   . Pneumonia   . RA (rheumatoid arthritis) ([place])   . Rotator cuff tear 11/04/2014    Patient Active Problem List   Diagnosis Date Noted  . Critical limb ischemia with history of revascularization of same extremity ([place]) 04/28/2020  . Limb ischemia 01/23/2020  . Ischemic leg 01/22/2020  . History of COVID-19 05/10/2019  . PAD  (peripheral artery disease) ([place])   . History of critical lower limb ischemia 02/19/2019  . S/P lumbar fusion, Lower back pain 01/03/2018  . Coronary artery disease involving native coronary artery of native heart with unstable angina pectoris ([place])   . Preventative health care 03/13/2017  . Transaminitis 12/16/2015  . Bilateral shoulder pain 08/20/2012  . Tobacco use disorder 08/20/2012  . HTN (hypertension) 04/20/2011  . HLD (hyperlipidemia) 10/08/2009  . Rheumatoid arthritis ([place]) 08/11/2009    Past Surgical History:  Procedure Laterality Date  . ABDOMINAL AORTAGRAM  02/20/2019  . ABDOMINAL AORTOGRAM W/LOWER EXTREMITY N/A 02/20/2019   Procedure: ABDOMINAL AORTOGRAM W/LOWER EXTREMITY;  Surgeon: Marty Heck, MD;  Location: [facility] CV LAB;  Service: Cardiovascular;  Laterality: N/A;  . ACHILLES TENDON SURGERY Bilateral   . AMPUTATION Left 05/11/2020   Procedure: LEFT ABOVE KNEE AMPUTATION;  Surgeon: Angelia Mould, MD;  Location: [facility];  Service: Vascular;  Laterality: Left;  . ENDARTERECTOMY POPLITEAL Left 05/01/2020   Procedure: ENDARTERECTOMY POPLITEAL;  Surgeon: Waynetta Sandy, MD;  Location: [facility];  Service: Vascular;  Laterality: Left;  . FASCIOTOMY Left 05/01/2020   Procedure: FASCIOTOMY;  Surgeon: Waynetta Sandy, MD;  Location: [facility];  Service: Vascular;  Laterality: Left;  . FEMORAL-POPLITEAL BYPASS GRAFT Left 02/26/2019   Procedure: BYPASS GRAFT FEMORAL-POPLITEAL ARTERY LEFT LEG USING 49mm PROPATEN  GRAFT;  Surgeon: Serafina Mitchell, MD;  [facility];  Service: Vascular;  Laterality: Left;  . INTRAOPERATIVE ARTERIOGRAM Left 01/22/2020   Procedure: INTRA OPERATIVE ARTERIOGRAM with administration of thrombolyics in Left femoral to popliteal bypass.;  Surgeon: Marty Heck, MD;  Location: [facility];  Service: Vascular;  Laterality: Left;  . LEFT HEART CATH AND CORONARY ANGIOGRAPHY N/A 03/15/2017   Procedure: LEFT HEART CATH AND CORONARY  ANGIOGRAPHY;  Surgeon: Nelva Bush, MD;  Location: [facility];  Service: Cardiovascular;  Laterality: N/A;  . LOWER EXTREMITY INTERVENTION Left 04/29/2020   Procedure: LOWER EXTREMITY INTERVENTION- LYSIS;  Surgeon: Marty Heck, MD;  Location: [facility];  Service: Cardiovascular;  Laterality: Left;  . OSTEOTOMY AND ULNAR SHORTENING Right 07/22/2002  . PATCH ANGIOPLASTY Left 05/01/2020   Procedure: [place];  Surgeon: Waynetta Sandy, MD;  Location: [facility];  Service: Vascular;  Laterality: Left;  . PERIPHERAL VASCULAR ATHERECTOMY  01/23/2020   Procedure: PERIPHERAL VASCULAR ATHERECTOMY;  Surgeon: Marty Heck, MD;  Location: [facility];  Service: Cardiovascular;;  . PERIPHERAL VASCULAR BALLOON ANGIOPLASTY  01/23/2020   Procedure: PERIPHERAL VASCULAR BALLOON ANGIOPLASTY;  Surgeon: Marty Heck, MD;  Location: [facility];  Service: Cardiovascular;;  . PERIPHERAL VASCULAR INTERVENTION Left 04/30/2020   Procedure: PERIPHERAL VASCULAR INTERVENTION;  Surgeon: Cherre Robins, MD;  Location: [facility];  Service: Cardiovascular;  Laterality: Left;  . PERIPHERAL VASCULAR THROMBECTOMY N/A 01/23/2020   Procedure: lysis recheck;  Surgeon: Marty Heck, MD;  Location: [facility];  Service: Cardiovascular;  Laterality: N/A;  + [place]   . PERIPHERAL VASCULAR THROMBECTOMY N/A 04/30/2020   Procedure: Lysis Recheck;  Surgeon: Cherre Robins, MD;  Location: [facility];  Service: Cardiovascular;  Laterality: N/A;  . SHOULDER ARTHROSCOPY WITH BICEPSTENOTOMY Right 03/11/2015   Procedure: SHOULDER ARTHROSCOPY WITH BICEPSTENOTOMY;  Surgeon: Ninetta Lights, MD;  Location: [facility];  Service: Orthopedics;  Laterality: Right;  . SHOULDER ARTHROSCOPY WITH DISTAL CLAVICLE RESECTION Left 11/19/2014   Procedure: SHOULDER ARTHROSCOPY WITH DISTAL CLAVICLE RESECTION;   Surgeon: Kathryne Hitch, MD;  Location: [facility];  Service: Orthopedics;  Laterality: Left;  . SHOULDER ARTHROSCOPY WITH ROTATOR CUFF REPAIR AND SUBACROMIAL DECOMPRESSION Left 11/19/2014   Procedure: LEFT SHOULDER ARTHROSCOPY, DEBRIDEMENT DISTAL CLAVICLE EXCISION, ACROMIOPLASTY WITH ROTATOR CUFF REPAIR ;  Surgeon: Kathryne Hitch, MD;  Location: [facility];  Service: Orthopedics;  Laterality: Left;  . THROMBECTOMY OF BYPASS GRAFT FEMORAL- POPLITEAL ARTERY Left 05/01/2020   Procedure: THROMBECTOMY OF LOWER EXTREMITY;  Surgeon: Waynetta Sandy, MD;  Location: [facility];  Service: Vascular;  Laterality: Left;  . TRANSFORAMINAL LUMBAR INTERBODY FUSION (TLIF) WITH PEDICLE SCREW FIXATION 1 LEVEL N/A 01/03/2018   Procedure: TRANSFORAMINAL LUMBAR INTERBODY FUSION (TLIF) LUMBAR FIVE-SACRAL ONE;  Surgeon: Melina Schools, MD;  Location: [facility];  Service: Orthopedics;  Laterality: N/A;  . ULTRASOUND GUIDANCE FOR VASCULAR ACCESS Right 01/22/2020   Procedure: ULTRASOUND GUIDANCE FOR VASCULAR ACCESS Right Common Femoral Artery.;  Surgeon: Marty Heck, MD;  Location: [facility];  Service: Vascular;  Laterality: Right;  [name] VIDEO ASSISTED THORACOSCOPY (VATS)/DECORTICATION Right 11/21/2011   drainage of empyema       Family History  Problem Relation Age of Onset  . Heart attack Father 37  . Alcohol abuse Father   . Aneurysm Mother   . Stroke Neg Hx   . Cancer Neg Hx  Social History   Tobacco Use  . Smoking status: Current Every Day Smoker    Packs/day: 0.25    Years: 20.00    Pack years: 5.00    Types: Cigarettes    Last attempt to quit: 02/19/2019    Years since quitting: 1.3  . Smokeless tobacco: Never Used  . Tobacco comment: 1-2 cigs per day; trying to quit  Vaping Use  . Vaping Use: Never used  Substance Use Topics  . Alcohol use: No    Alcohol/week: 0.0 standard drinks    Comment: sober 1998  . Drug use: No    Home Medications Prior to Admission  medications   Medication Sig Start Date End Date Taking? Authorizing Provider  benzonatate (TESSALON) 100 MG capsule Take 1 capsule (100 mg total) by mouth every 8 (eight) hours. 06/26/20  Yes Zhanae Proffit, PA-C  potassium chloride SA (KLOR-CON) 20 MEQ tablet Take 1 tablet (20 mEq total) by mouth 2 (two) times daily for 7 days. 06/26/20 07/03/20 Yes Anika Shore, PA-C  amLODipine (NORVASC) 10 MG tablet Take 1 tablet (10 mg total) by mouth daily. 01/27/20   Jose Persia, MD  ascorbic acid (VITAMIN C) 500 MG tablet Take 500 mg by mouth every other day.     [provider]  atorvastatin (LIPITOR) 80 MG tablet Take 1 tablet (80 mg total) by mouth daily at 6 PM. 11/06/19   Oda Kilts, MD  chlorthalidone (HYGROTON) 25 MG tablet Take 1 tablet (25 mg total) by mouth daily. 01/08/20   Harvie Heck, MD  clopidogrel (PLAVIX) 75 MG tablet Take 1 tablet (75 mg total) by mouth daily. 11/06/19 11/05/20  Oda Kilts, MD  docusate sodium (COLACE) 100 MG capsule Take 1 capsule (100 mg total) by mouth daily. Patient taking differently: Take 100 mg by mouth daily as needed for mild constipation. 02/27/19   Santos-Sanchez, Merlene Morse, MD  gabapentin (NEURONTIN) 100 MG capsule Take 100 mg by mouth at bedtime as needed (for pain).  12/31/19   [provider]  Glycerin-Polysorbate 80 (REFRESH DRY EYE THERAPY OP) Place 1 drop into both eyes 3 (three) times daily as needed (for dryness or irritation).     [provider]  ibuprofen (ADVIL) 200 MG tablet Take 200-600 mg by mouth every 6 (six) hours as needed for headache or mild pain.    [provider]  naloxone [facility]) 2 MG/2ML injection Inject 2 mg into the muscle once as needed (opiod overdose (call 911, inject intramuscularly in shoulder or thigh. Repeat every 3 minutes)).     [provider]  oxyCODONE-acetaminophen (PERCOCET) 10-325 MG tablet Take 1 tablet by mouth 4 (four) times daily as needed for pain.  04/15/19    [provider]  oxyCODONE-acetaminophen (PERCOCET) 10-325 MG tablet Take 1 tablet by mouth every 6 (six) hours as needed for pain. Take your new prescription for acute post-surgical pain 05/13/20 05/13/21  Setzer, Edman Circle, PA-C  predniSONE (DELTASONE) 5 MG tablet TAKE 1 TABLET BY MOUTH EVERY DAY WITH BREAKFAST Patient taking differently: Take 5 mg by mouth daily. 01/22/20   Mosetta Anis, MD  valsartan (DIOVAN) 40 MG tablet Take 40 mg by mouth daily. 02/03/20   [provider]    Allergies    Dilaudid [hydromorphone hcl], Ramipril, and Tramadol  Review of Systems   Review of Systems  Constitutional: Positive for appetite change.  Respiratory: Positive for cough and shortness of breath.   Neurological: Positive for weakness.  All other  systems reviewed and are negative.   Physical Exam Updated Vital Signs BP 124/76   Pulse 79   Temp 98.1 F (36.7 C) (Oral)   Resp 11   Ht 5\' 8"  (1.727 m)   Wt 80.7 kg   SpO2 99%   BMI 27.06 kg/m   Physical Exam Vitals and nursing note reviewed.  Constitutional:      General: He is not in acute distress.    Appearance: He is well-developed and well-nourished.     Comments: Appears nontoxic  HENT:     Head: Normocephalic and atraumatic.  Eyes:     Extraocular Movements: EOM normal.     Conjunctiva/sclera: Conjunctivae normal.     Pupils: Pupils are equal, round, and reactive to light.  Cardiovascular:     Rate and Rhythm: Normal rate and regular rhythm.     Pulses: Normal pulses and intact distal pulses.  Pulmonary:     Effort: Pulmonary effort is normal. No respiratory distress.     Breath sounds: Normal breath sounds. No wheezing.     Comments: Clear lung sounds. Speaking in full sentences. spo2 stable on RA Abdominal:     General: There is no distension.     Palpations: Abdomen is soft. There is no mass.     Tenderness: There is no abdominal tenderness. There is no guarding or rebound.  Musculoskeletal:         General: Normal range of motion.     Cervical back: Normal range of motion and neck supple.     Comments: L AKA  Skin:    General: Skin is warm and dry.     Capillary Refill: Capillary refill takes less than 2 seconds.  Neurological:     Mental Status: He is alert and oriented to person, place, and time.  Psychiatric:        Mood and Affect: Mood and affect normal.     ED Results / Procedures / Treatments   Labs (all labs ordered are listed, but only abnormal results are displayed) Labs Reviewed  RESP PANEL BY RT-PCR (FLU A&B, COVID) ARPGX2 - Abnormal; Notable for the following components:      Result Value   SARS Coronavirus 2 by RT PCR POSITIVE (*)    All other components within normal limits  BASIC METABOLIC PANEL - Abnormal; Notable for the following components:   Potassium 2.6 (*)    Chloride 96 (*)    Glucose, Bld 104 (*)    All other components within normal limits  CBC - Abnormal; Notable for the following components:   Hemoglobin 10.0 (*)    HCT 34.4 (*)    MCV 75.6 (*)    MCH 22.0 (*)    MCHC 29.1 (*)    RDW 16.0 (*)    All other components within normal limits    EKG EKG Interpretation  Date/Time:  Saturday June 26 2020 19:48:16 EST Ventricular Rate:  82 PR Interval:    QRS Duration: 102 QT Interval:  377 QTC Calculation: 441 R Axis:   53 Text Interpretation: Sinus rhythm Minimal ST depression, diffuse leads No acute changes No significant change since last tracing Confirmed by Varney Biles [phone]) on 06/27/2020 12:09:45 AM   Radiology CT Angio Chest PE W and/or Wo Contrast  Result Date: 06/26/2020 CLINICAL DATA:  Shortness of EXAM: CT ANGIOGRAPHY CHEST WITH CONTRAST TECHNIQUE: Multidetector CT imaging of the chest was performed using the standard protocol during bolus administration of intravenous contrast. Multiplanar CT image  reconstructions and MIPs were obtained to evaluate the vascular anatomy. CONTRAST:  OMNIPAQUE IOHEXOL 350 MG/ML SOLN  COMPARISON:  Omnipaque 350,60 mL FINDINGS: Cardiovascular: There is a optimal opacification of the pulmonary arteries. There is no central,segmental, or subsegmental filling defects within the pulmonary arteries. The heart is normal in size. No pericardial effusion or thickening. No evidence right heart strain. There is normal three-vessel brachiocephalic anatomy without proximal stenosis. Scattered aortic atherosclerosis seen. Coronary artery calcifications noted. Mediastinum/Nodes: No hilar, mediastinal, or axillary adenopathy. Thyroid gland, trachea, and esophagus demonstrate no significant findings. Lungs/Pleura: Streaky airspace opacity seen at lung bases. No pleural effusion or pneumothorax. No airspace consolidation. Upper Abdomen: No acute abnormalities present in the visualized portions of the upper abdomen. Musculoskeletal: No chest wall abnormality. No acute or significant osseous findings. Review of the MIP images confirms the above findings. IMPRESSION: Central segmental, or subsegmental pulmonary embolism. No acute intrathoracic pathology to explain the patient's symptoms. Aortic Atherosclerosis (ICD10-I70.0). Electronically Signed   By: Jonna Clark M.D.   On: 06/26/2020 23:49   DG Chest Port 1 View  Result Date: 06/26/2020 CLINICAL DATA:  Shortness of breath, cough. EXAM: PORTABLE CHEST 1 VIEW COMPARISON:  May 10, 2019. FINDINGS: The heart size and mediastinal contours are within normal limits. Both lungs are clear. No pneumothorax or pleural effusion is noted. The visualized skeletal structures are unremarkable. IMPRESSION: No active disease. Aortic Atherosclerosis (ICD10-I70.0). Electronically Signed   By: Lupita Raider M.D.   On: 06/26/2020 17:45    Procedures Procedures (including critical care time)  Medications Ordered in ED Medications  albuterol (VENTOLIN HFA) 108 (90 Base) MCG/ACT inhaler 2 puff (has no administration in time range)  potassium chloride 10 mEq in 100 mL  IVPB (0 mEq Intravenous Stopped 06/27/20 0024)  potassium chloride SA (KLOR-CON) CR tablet 40 mEq (40 mEq Oral Given 06/26/20 2005)  magnesium sulfate IVPB 2 g 50 mL (0 g Intravenous Stopped 06/26/20 2311)  oxyCODONE-acetaminophen (PERCOCET/ROXICET) 5-325 MG per tablet 1 tablet (1 tablet Oral Given 06/26/20 2012)  sodium chloride 0.9 % bolus 1,000 mL (0 mLs Intravenous Stopped 06/26/20 2248)  iohexol (OMNIPAQUE) 350 MG/ML injection 100 mL (100 mLs Intravenous Contrast Given 06/26/20 2335)  oxyCODONE-acetaminophen (PERCOCET/ROXICET) 5-325 MG per tablet 1 tablet (1 tablet Oral Given 06/27/20 0024)    ED Course  I have reviewed the triage vital signs and the nursing notes.  Pertinent labs & imaging results that were available during my care of the patient were reviewed by me and considered in my medical decision making (see chart for details).    MDM Rules/Calculators/A&P                          Patient presenting for evaluation of shortness of breath, cough, decreased p.o. intake and weakness.  Labs obtained in triage interpreted by me, show stable anemia.  Patient's potassium is low at 2.6, will order EKG replenish both potassium and mag orally and IV.  Additionally, patient's Covid test came back positive, this is likely the root cause of his symptoms.  Chest x-ray viewed interpreted by me, no pneumonia pneumothorax or effusion.  Patient sats are overall reassuring.   EKG without significant change from previous.  Informed by RN that patient's blood pressure is soft, dropping to the 80s and 90s systolic.  Will give fluids, as I favor dehydration due to decreased p.o. intake.  However, also in the setting of shortness of breath and cough with a  recent surgery and change in anticoagulation, consider PE.  Will order CTA.  CTA negative for PE.  Patient's blood pressure responded well to fluids.  On reassessment, he is feeling better.  I discussed continued supplementation of potassium at home.  I discussed with  hospital at home program, they will continue to monitor patient on an outpatient setting.  I discussed continued symptomatic treatment for Covid symptoms, and return to the ER with worsening respiratory status.  At this time, patient appears safe for discharge.  Return precautions given.  Patient states he understands and agrees to plan.  Final Clinical Impression(s) / ED Diagnoses Final diagnoses:  COVID-19  Hypokalemia    Rx / DC Orders ED Discharge Orders         Ordered    potassium chloride SA (KLOR-CON) 20 MEQ tablet  2 times daily        06/26/20 2355    benzonatate (TESSALON) 100 MG capsule  Every 8 hours        06/26/20 2355           Alveria Apley, PA-C 06/27/20 0040    Derwood Kaplan, MD 06/28/20 2338

## 2020-06-27 NOTE — Progress Notes (Signed)
Called to discuss with patient about COVID-19 symptoms and the use of one of the available treatments for those with mild to moderate Covid symptoms and at a high risk of hospitalization.     Chart reviewed completed - unable to offer therapy due to duration of symptoms.    Rexene Alberts, MSN, NP-C [facility] for Infectious Disease [facility]  [email or web address] Pager: [phone] Office: [phone] [facility] Main Line: [phone]

## 2020-07-13 DIAGNOSIS — G894 Chronic pain syndrome: Secondary | ICD-10-CM | POA: Diagnosis not present

## 2020-07-13 DIAGNOSIS — I70213 Atherosclerosis of native arteries of extremities with intermittent claudication, bilateral legs: Secondary | ICD-10-CM | POA: Diagnosis not present

## 2020-07-13 DIAGNOSIS — M48062 Spinal stenosis, lumbar region with neurogenic claudication: Secondary | ICD-10-CM | POA: Diagnosis not present

## 2020-07-13 DIAGNOSIS — Z79891 Long term (current) use of opiate analgesic: Secondary | ICD-10-CM | POA: Diagnosis not present

## 2020-07-13 DIAGNOSIS — G8929 Other chronic pain: Secondary | ICD-10-CM | POA: Diagnosis not present

## 2020-07-13 DIAGNOSIS — M79669 Pain in unspecified lower leg: Secondary | ICD-10-CM | POA: Diagnosis not present

## 2020-07-13 DIAGNOSIS — R5382 Chronic fatigue, unspecified: Secondary | ICD-10-CM | POA: Diagnosis not present

## 2020-07-13 DIAGNOSIS — I739 Peripheral vascular disease, unspecified: Secondary | ICD-10-CM | POA: Diagnosis not present

## 2020-07-13 DIAGNOSIS — M4326 Fusion of spine, lumbar region: Secondary | ICD-10-CM | POA: Diagnosis not present

## 2020-07-13 DIAGNOSIS — M4807 Spinal stenosis, lumbosacral region: Secondary | ICD-10-CM | POA: Diagnosis not present

## 2020-07-13 DIAGNOSIS — M4726 Other spondylosis with radiculopathy, lumbar region: Secondary | ICD-10-CM | POA: Diagnosis not present

## 2020-07-13 DIAGNOSIS — M792 Neuralgia and neuritis, unspecified: Secondary | ICD-10-CM | POA: Diagnosis not present

## 2020-07-13 DIAGNOSIS — M47897 Other spondylosis, lumbosacral region: Secondary | ICD-10-CM | POA: Diagnosis not present

## 2020-07-13 DIAGNOSIS — M5136 Other intervertebral disc degeneration, lumbar region: Secondary | ICD-10-CM | POA: Diagnosis not present

## 2020-07-15 DIAGNOSIS — I739 Peripheral vascular disease, unspecified: Secondary | ICD-10-CM | POA: Diagnosis not present

## 2020-07-15 DIAGNOSIS — Z4781 Encounter for orthopedic aftercare following surgical amputation: Secondary | ICD-10-CM | POA: Diagnosis not present

## 2020-07-15 DIAGNOSIS — I10 Essential (primary) hypertension: Secondary | ICD-10-CM | POA: Diagnosis not present

## 2020-07-15 DIAGNOSIS — D509 Iron deficiency anemia, unspecified: Secondary | ICD-10-CM | POA: Diagnosis not present

## 2020-07-15 DIAGNOSIS — M069 Rheumatoid arthritis, unspecified: Secondary | ICD-10-CM | POA: Diagnosis not present

## 2020-07-15 DIAGNOSIS — Z89612 Acquired absence of left leg above knee: Secondary | ICD-10-CM | POA: Diagnosis not present

## 2020-07-15 DIAGNOSIS — E785 Hyperlipidemia, unspecified: Secondary | ICD-10-CM | POA: Diagnosis not present

## 2020-07-15 DIAGNOSIS — G8929 Other chronic pain: Secondary | ICD-10-CM | POA: Diagnosis not present

## 2020-07-15 DIAGNOSIS — F17210 Nicotine dependence, cigarettes, uncomplicated: Secondary | ICD-10-CM | POA: Diagnosis not present

## 2020-07-15 DIAGNOSIS — Z7901 Long term (current) use of anticoagulants: Secondary | ICD-10-CM | POA: Diagnosis not present

## 2020-07-15 DIAGNOSIS — G4700 Insomnia, unspecified: Secondary | ICD-10-CM | POA: Diagnosis not present

## 2020-07-15 DIAGNOSIS — Z9181 History of falling: Secondary | ICD-10-CM | POA: Diagnosis not present

## 2020-07-15 DIAGNOSIS — K219 Gastro-esophageal reflux disease without esophagitis: Secondary | ICD-10-CM | POA: Diagnosis not present

## 2020-07-15 DIAGNOSIS — M5117 Intervertebral disc disorders with radiculopathy, lumbosacral region: Secondary | ICD-10-CM | POA: Diagnosis not present

## 2020-07-15 DIAGNOSIS — Z7902 Long term (current) use of antithrombotics/antiplatelets: Secondary | ICD-10-CM | POA: Diagnosis not present

## 2020-07-15 DIAGNOSIS — M1612 Unilateral primary osteoarthritis, left hip: Secondary | ICD-10-CM | POA: Diagnosis not present

## 2020-08-11 DIAGNOSIS — M5136 Other intervertebral disc degeneration, lumbar region: Secondary | ICD-10-CM | POA: Diagnosis not present

## 2020-08-11 DIAGNOSIS — G894 Chronic pain syndrome: Secondary | ICD-10-CM | POA: Diagnosis not present

## 2020-08-13 DIAGNOSIS — I70229 Atherosclerosis of native arteries of extremities with rest pain, unspecified extremity: Secondary | ICD-10-CM | POA: Diagnosis not present

## 2020-08-13 DIAGNOSIS — U071 COVID-19: Secondary | ICD-10-CM | POA: Diagnosis not present

## 2020-08-13 DIAGNOSIS — R531 Weakness: Secondary | ICD-10-CM | POA: Diagnosis not present

## 2020-08-17 DIAGNOSIS — G894 Chronic pain syndrome: Secondary | ICD-10-CM | POA: Diagnosis not present

## 2020-08-17 DIAGNOSIS — M5136 Other intervertebral disc degeneration, lumbar region: Secondary | ICD-10-CM | POA: Diagnosis not present

## 2020-09-01 DIAGNOSIS — I10 Essential (primary) hypertension: Secondary | ICD-10-CM | POA: Diagnosis not present

## 2020-09-01 DIAGNOSIS — R202 Paresthesia of skin: Secondary | ICD-10-CM | POA: Diagnosis not present

## 2020-09-01 DIAGNOSIS — R7302 Impaired glucose tolerance (oral): Secondary | ICD-10-CM | POA: Diagnosis not present

## 2020-09-01 DIAGNOSIS — R7309 Other abnormal glucose: Secondary | ICD-10-CM | POA: Diagnosis not present

## 2020-09-03 ENCOUNTER — Telehealth: Payer: Self-pay | Admitting: *Deleted

## 2020-09-03 NOTE — Telephone Encounter (Signed)
Called from [place], [facility] - stated pt has  atherosclerosis disease and wanted to know if pt is on a statin or higher enough dose of a statin. Informed pt is on Atorvastatin 80 mg daily. She stated is what needed to be prescribed and thank you.

## 2020-09-08 DIAGNOSIS — M5136 Other intervertebral disc degeneration, lumbar region: Secondary | ICD-10-CM | POA: Diagnosis not present

## 2020-09-08 DIAGNOSIS — G894 Chronic pain syndrome: Secondary | ICD-10-CM | POA: Diagnosis not present

## 2020-09-10 DIAGNOSIS — U071 COVID-19: Secondary | ICD-10-CM | POA: Diagnosis not present

## 2020-09-10 DIAGNOSIS — I70229 Atherosclerosis of native arteries of extremities with rest pain, unspecified extremity: Secondary | ICD-10-CM | POA: Diagnosis not present

## 2020-09-10 DIAGNOSIS — Z89612 Acquired absence of left leg above knee: Secondary | ICD-10-CM | POA: Diagnosis not present

## 2020-09-10 DIAGNOSIS — R531 Weakness: Secondary | ICD-10-CM | POA: Diagnosis not present

## 2020-09-14 DIAGNOSIS — G894 Chronic pain syndrome: Secondary | ICD-10-CM | POA: Diagnosis not present

## 2020-09-14 DIAGNOSIS — M5136 Other intervertebral disc degeneration, lumbar region: Secondary | ICD-10-CM | POA: Diagnosis not present

## 2020-10-06 DIAGNOSIS — M5136 Other intervertebral disc degeneration, lumbar region: Secondary | ICD-10-CM | POA: Diagnosis not present

## 2020-10-06 DIAGNOSIS — G894 Chronic pain syndrome: Secondary | ICD-10-CM | POA: Diagnosis not present

## 2020-10-11 DIAGNOSIS — R531 Weakness: Secondary | ICD-10-CM | POA: Diagnosis not present

## 2020-10-11 DIAGNOSIS — U071 COVID-19: Secondary | ICD-10-CM | POA: Diagnosis not present

## 2020-10-11 DIAGNOSIS — I70229 Atherosclerosis of native arteries of extremities with rest pain, unspecified extremity: Secondary | ICD-10-CM | POA: Diagnosis not present

## 2020-10-28 DIAGNOSIS — M5136 Other intervertebral disc degeneration, lumbar region: Secondary | ICD-10-CM | POA: Diagnosis not present

## 2020-10-28 DIAGNOSIS — M47897 Other spondylosis, lumbosacral region: Secondary | ICD-10-CM | POA: Diagnosis not present

## 2020-10-28 DIAGNOSIS — R5382 Chronic fatigue, unspecified: Secondary | ICD-10-CM | POA: Diagnosis not present

## 2020-10-28 DIAGNOSIS — G8929 Other chronic pain: Secondary | ICD-10-CM | POA: Diagnosis not present

## 2020-10-28 DIAGNOSIS — G894 Chronic pain syndrome: Secondary | ICD-10-CM | POA: Diagnosis not present

## 2020-10-28 DIAGNOSIS — M25519 Pain in unspecified shoulder: Secondary | ICD-10-CM | POA: Diagnosis not present

## 2020-10-28 DIAGNOSIS — I119 Hypertensive heart disease without heart failure: Secondary | ICD-10-CM | POA: Diagnosis not present

## 2020-10-28 DIAGNOSIS — M48062 Spinal stenosis, lumbar region with neurogenic claudication: Secondary | ICD-10-CM | POA: Diagnosis not present

## 2020-10-28 DIAGNOSIS — M25559 Pain in unspecified hip: Secondary | ICD-10-CM | POA: Diagnosis not present

## 2020-10-28 DIAGNOSIS — Z8249 Family history of ischemic heart disease and other diseases of the circulatory system: Secondary | ICD-10-CM | POA: Diagnosis not present

## 2020-10-28 DIAGNOSIS — M792 Neuralgia and neuritis, unspecified: Secondary | ICD-10-CM | POA: Diagnosis not present

## 2020-11-09 ENCOUNTER — Other Ambulatory Visit: Payer: Self-pay | Admitting: Internal Medicine

## 2020-11-09 DIAGNOSIS — I10 Essential (primary) hypertension: Secondary | ICD-10-CM

## 2020-11-10 DIAGNOSIS — R531 Weakness: Secondary | ICD-10-CM | POA: Diagnosis not present

## 2020-11-10 DIAGNOSIS — U071 COVID-19: Secondary | ICD-10-CM | POA: Diagnosis not present

## 2020-11-10 DIAGNOSIS — I70229 Atherosclerosis of native arteries of extremities with rest pain, unspecified extremity: Secondary | ICD-10-CM | POA: Diagnosis not present

## 2020-11-10 NOTE — Telephone Encounter (Signed)
Last visit 01/08/20  Left AKA 05/01/20  No future appts scheduled at this time     appt request sent to front desk

## 2020-11-17 ENCOUNTER — Encounter: Payer: Self-pay | Admitting: *Deleted

## 2020-12-01 DIAGNOSIS — M069 Rheumatoid arthritis, unspecified: Secondary | ICD-10-CM | POA: Diagnosis not present

## 2020-12-01 DIAGNOSIS — M5136 Other intervertebral disc degeneration, lumbar region: Secondary | ICD-10-CM | POA: Diagnosis not present

## 2020-12-01 DIAGNOSIS — G894 Chronic pain syndrome: Secondary | ICD-10-CM | POA: Diagnosis not present

## 2020-12-11 DIAGNOSIS — R531 Weakness: Secondary | ICD-10-CM | POA: Diagnosis not present

## 2020-12-11 DIAGNOSIS — U071 COVID-19: Secondary | ICD-10-CM | POA: Diagnosis not present

## 2020-12-11 DIAGNOSIS — I70229 Atherosclerosis of native arteries of extremities with rest pain, unspecified extremity: Secondary | ICD-10-CM | POA: Diagnosis not present

## 2020-12-13 ENCOUNTER — Telehealth: Payer: Self-pay

## 2020-12-13 NOTE — Telephone Encounter (Signed)
Patient called regarding referral for therapy to learn how to walk s/p AKA. Seen by [facility] in 05/2020 - referred to [facility] - left VM that patient should have [facility] send in RX for therapy.

## 2020-12-27 ENCOUNTER — Other Ambulatory Visit: Payer: Self-pay | Admitting: Internal Medicine

## 2020-12-28 ENCOUNTER — Encounter: Payer: Self-pay | Admitting: *Deleted

## 2021-01-03 DIAGNOSIS — M5136 Other intervertebral disc degeneration, lumbar region: Secondary | ICD-10-CM | POA: Diagnosis not present

## 2021-01-03 DIAGNOSIS — G894 Chronic pain syndrome: Secondary | ICD-10-CM | POA: Diagnosis not present

## 2021-01-03 DIAGNOSIS — Z79891 Long term (current) use of opiate analgesic: Secondary | ICD-10-CM | POA: Diagnosis not present

## 2021-01-10 DIAGNOSIS — U071 COVID-19: Secondary | ICD-10-CM | POA: Diagnosis not present

## 2021-01-10 DIAGNOSIS — I70229 Atherosclerosis of native arteries of extremities with rest pain, unspecified extremity: Secondary | ICD-10-CM | POA: Diagnosis not present

## 2021-01-10 DIAGNOSIS — R531 Weakness: Secondary | ICD-10-CM | POA: Diagnosis not present

## 2021-01-24 ENCOUNTER — Encounter: Payer: Medicare Other | Admitting: Internal Medicine

## 2021-01-27 ENCOUNTER — Other Ambulatory Visit: Payer: Self-pay

## 2021-01-27 ENCOUNTER — Encounter: Payer: Self-pay | Admitting: Internal Medicine

## 2021-01-27 ENCOUNTER — Ambulatory Visit (INDEPENDENT_AMBULATORY_CARE_PROVIDER_SITE_OTHER): Payer: Medicare Other | Admitting: Internal Medicine

## 2021-01-27 VITALS — BP 114/68 | HR 89 | Temp 97.7°F | Ht 68.0 in | Wt 170.0 lb

## 2021-01-27 DIAGNOSIS — G8921 Chronic pain due to trauma: Secondary | ICD-10-CM

## 2021-01-27 DIAGNOSIS — M0579 Rheumatoid arthritis with rheumatoid factor of multiple sites without organ or systems involvement: Secondary | ICD-10-CM | POA: Diagnosis not present

## 2021-01-27 DIAGNOSIS — F17200 Nicotine dependence, unspecified, uncomplicated: Secondary | ICD-10-CM

## 2021-01-27 DIAGNOSIS — Z131 Encounter for screening for diabetes mellitus: Secondary | ICD-10-CM | POA: Diagnosis not present

## 2021-01-27 DIAGNOSIS — I10 Essential (primary) hypertension: Secondary | ICD-10-CM | POA: Diagnosis not present

## 2021-01-27 DIAGNOSIS — L03039 Cellulitis of unspecified toe: Secondary | ICD-10-CM

## 2021-01-27 DIAGNOSIS — B351 Tinea unguium: Secondary | ICD-10-CM | POA: Diagnosis not present

## 2021-01-27 LAB — POCT GLYCOSYLATED HEMOGLOBIN (HGB A1C): Hemoglobin A1C: 4.9 % (ref 4.0–5.6)

## 2021-01-27 LAB — GLUCOSE, CAPILLARY: Glucose-Capillary: 93 mg/dL (ref 70–99)

## 2021-01-27 MED ORDER — DOCUSATE SODIUM 100 MG PO CAPS: 100.0000 mg | ORAL_CAPSULE | Freq: Every day | ORAL | 0 refills | Status: DC

## 2021-01-27 MED ORDER — PREDNISONE 10 MG PO TABS: 10.0000 mg | ORAL_TABLET | Freq: Every day | ORAL | 0 refills | Status: DC

## 2021-01-27 MED ORDER — VARENICLINE TARTRATE 0.5 MG X 11 & 1 MG X 42 PO MISC: ORAL | 0 refills | Status: DC

## 2021-01-27 MED ORDER — VARENICLINE TARTRATE 1 MG PO TABS
1.0000 mg | ORAL_TABLET | Freq: Two times a day (BID) | ORAL | 1 refills | Status: DC
Start: 2021-01-27 — End: 2021-04-05

## 2021-01-27 MED ORDER — DOXYCYCLINE HYCLATE 100 MG PO CAPS
100.0000 mg | ORAL_CAPSULE | Freq: Two times a day (BID) | ORAL | 0 refills | Status: DC
Start: 2021-01-27 — End: 2022-02-08

## 2021-01-27 NOTE — Patient Instructions (Addendum)
Thank you for trusting me with your care. To recap, today we discussed the following:   1. Rheumatoid arthritis involving multiple sites with positive rheumatoid factor ([place])  - Ambulatory referral to Rheumatology  - predniSONE (DELTASONE) 10 MG tablet; Take 1 tablet (10 mg total) by mouth daily.  Dispense: 30 tablet; Refill: 0  2. Tobacco use disorder  - varenicline (CHANTIX STARTING MONTH PAK) 0.5 MG X 11 & 1 MG X 42 tablet; Take one 0.5 mg tablet by mouth once daily for 3 days, then increase to one 0.5 mg tablet twice daily for 4 days, then increase to one 1 mg tablet twice daily.  Dispense: 53 tablet; Refill: 0 - varenicline (CHANTIX CONTINUING MONTH PAK) 1 MG tablet; Take 1 tablet (1 mg total) by mouth 2 (two) times daily.  Dispense: 60 tablet; Refill: 1  3. Onychomycosis  - Ambulatory referral to Podiatry  4. Primary hypertension  - BMP8+[place]  5. Screening for diabetes mellitus  - POC Hbg A1C  6. Paronychia of great toe  - doxycycline (VIBRAMYCIN) 100 MG capsule; Take 1 capsule (100 mg total) by mouth 2 (two) times daily.  Dispense: 14 capsule; Refill: 0

## 2021-01-27 NOTE — Progress Notes (Signed)
   CC: Rheumatoid arthritis, tobacco use disorder, onychomycosis, paronychia with purulent drainage ,primary hypertension, type 2 diabetes  HPI:[name] is a 60 y.o. male who presents for evaluation of rheumatoid arthritis, tobacco use disorder, onychomycosis, paronychia with purulent drainage, primary hypertension, and screening for type 2 diabetes. Please see individual problem based A/P for details.  Depression, PHQ-9: Based on the patients  [place] Visit from 01/27/2021 in [place]  PHQ-9 Total Score 3      score we have does not suggest depression  Past Medical History:  Diagnosis Date   Anemia    Anemia 08/06/2012   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   Hypertension    states is borderline on medication; has been on med. x 5-6 yr.   Hypokalemia 05/30/2018   Impingement syndrome of shoulder region 10/2014   left   Insomnia 06/01/2015   Ischemic ulcer of toe of left foot (H[facility])    Left shoulder pain 12/16/2015   Low back pain without sciatica 10/29/2009   Lupus ([place])    Peripheral vascular disease ([facility])    Pneumonia    RA (rheumatoid arthritis) ([place])    Rotator cuff tear 11/04/2014   Review of Systems:   Review of Systems  Constitutional:  Negative for chills and fever.  Cardiovascular:  Negative for chest pain and leg swelling.  Musculoskeletal:  Positive for joint pain. Negative for falls.    Physical Exam: Vitals:   01/27/21 1105  BP: 114/68  Pulse: 89  Temp: 97.7 F (36.5 C)  TempSrc: Oral  SpO2: 95%  Weight: 170 lb (77.1 kg)  Height: '5\' 8"'$  (1.727 m)   General: Stated age, gray hair and mustache, sitting in wheelchair, left AKA wearing leg prosthesis HEENT: Conjunctiva nl , antiicteric sclerae, moist mucous  membranes Cardiovascular: Normal rate, regular rhythm.  No murmurs, rubs, or gallops Pulmonary : Equal breath sounds, No wheezes, rales, or rhonchi Abdominal: soft, nontender,  bowel sounds present Ext: Left AKA, right great toe with subcentimeter hematoma on the lateral side and mild erythema without expressible purulence of great toe nail on lateral side nail bed ( patient reports recent purulence)   Assessment & Plan:   See Encounters Tab for problem based charting.  Patient discussed with Dr. Philipp Ovens

## 2021-01-28 ENCOUNTER — Encounter: Payer: Self-pay | Admitting: Internal Medicine

## 2021-01-28 DIAGNOSIS — B351 Tinea unguium: Secondary | ICD-10-CM | POA: Insufficient documentation

## 2021-01-28 DIAGNOSIS — L03039 Cellulitis of unspecified toe: Secondary | ICD-10-CM | POA: Insufficient documentation

## 2021-01-28 HISTORY — DX: Cellulitis of unspecified toe: L03.039

## 2021-01-28 HISTORY — DX: Tinea unguium: B35.1

## 2021-01-28 LAB — BMP8+ANION GAP
Anion Gap: 16 mmol/L (ref 10.0–18.0)
BUN/Creatinine Ratio: 22 (ref 10–24)
BUN: 17 mg/dL (ref 8–27)
CO2: 25 mmol/L (ref 20–29)
Calcium: 9.4 mg/dL (ref 8.6–10.2)
Chloride: 97 mmol/L (ref 96–106)
Creatinine, Ser: 0.77 mg/dL (ref 0.76–1.27)
Glucose: 80 mg/dL (ref 65–99)
Potassium: 3.3 mmol/L — ABNORMAL LOW (ref 3.5–5.2)
Sodium: 138 mmol/L (ref 134–144)
eGFR: 102 mL/min/{1.73_m2} (ref >59)

## 2021-01-28 NOTE — Assessment & Plan Note (Signed)
Patient is not currently following with a rheumatologist.  He is asking for referral today so that he can go to rheumatology as he found with [facility] medical.  In addition he is out of prednisone which he has been using to treat flares.  Reviewing his medication he is prescribed recently 5 mg prednisone tablets and reports often having to take 2 tablets to control flares.  In the past he has been on DMARDs.   Assessment/plan:Rheumatoid arthritis involving multiple sites with positive rheumatoid factor ([place]) - Ambulatory referral to Rheumatology - predniSONE (DELTASONE) 10 MG tablet; Take 1 tablet (10 mg total) by mouth daily.  Dispense: 30 tablet; Refill: 0

## 2021-01-28 NOTE — Assessment & Plan Note (Signed)
Onychomycosis - Ambulatory referral to Podiatry

## 2021-01-28 NOTE — Assessment & Plan Note (Signed)
Patient smoked much of his life with variations from half a pack to 2 packs/day.  He is interested in quitting and has nicotine patches at home.  Assessment/plan: Tobacco use disorder - varenicline (CHANTIX STARTING MONTH PAK) 0.5 MG X 11 & 1 MG X 42 tablet; Take one 0.5 mg tablet by mouth once daily for 3 days, then increase to one 0.5 mg tablet twice daily for 4 days, then increase to one 1 mg tablet twice daily.  Dispense: 53 tablet; Refill: 0 - varenicline (CHANTIX CONTINUING MONTH PAK) 1 MG tablet; Take 1 tablet (1 mg total) by mouth 2 (two) times daily.  Dispense: 60 tablet; Refill:

## 2021-01-28 NOTE — Assessment & Plan Note (Signed)
Patient reports to have an ingrown toenail on his great toe of right foot.  He has had some purulent drainage and erythema.  Also has a sub centimeter hematoma after running over his great toe with wheelchair.   Assessment/plan: Paronychia of great toe - doxycycline (VIBRAMYCIN) 100 MG capsule; Take 1 capsule (100 mg total) by mouth 2 (two) times daily.  Dispense: 14 capsule; Refill: 0 - referral to Podiatry for onychomycosis and Paronychia

## 2021-01-28 NOTE — Assessment & Plan Note (Signed)
  Patient's BP today is 114/68.The patient endorses adherence to his medication regimen.  Reports he was taken off of valsartan, will discontinue on medication list No changes to regimen.  Assessment/Plan :Hypertension, controlled on current regimen.  Continue chlorthalidone 25 mg daily Continue amlodipine 10 mg daily

## 2021-01-31 DIAGNOSIS — M069 Rheumatoid arthritis, unspecified: Secondary | ICD-10-CM | POA: Diagnosis not present

## 2021-01-31 DIAGNOSIS — Z79891 Long term (current) use of opiate analgesic: Secondary | ICD-10-CM | POA: Diagnosis not present

## 2021-01-31 DIAGNOSIS — M5136 Other intervertebral disc degeneration, lumbar region: Secondary | ICD-10-CM | POA: Diagnosis not present

## 2021-01-31 DIAGNOSIS — G894 Chronic pain syndrome: Secondary | ICD-10-CM | POA: Diagnosis not present

## 2021-02-01 NOTE — Progress Notes (Signed)
Internal Medicine Clinic Attending  Case discussed with Dr. Steen  At the time of the visit.  We reviewed the resident's history and exam and pertinent patient test results.  I agree with the assessment, diagnosis, and plan of care documented in the resident's note.  

## 2021-02-04 ENCOUNTER — Encounter: Payer: Medicare Other | Admitting: Internal Medicine

## 2021-02-10 DIAGNOSIS — U071 COVID-19: Secondary | ICD-10-CM | POA: Diagnosis not present

## 2021-02-10 DIAGNOSIS — R531 Weakness: Secondary | ICD-10-CM | POA: Diagnosis not present

## 2021-02-10 DIAGNOSIS — I70229 Atherosclerosis of native arteries of extremities with rest pain, unspecified extremity: Secondary | ICD-10-CM | POA: Diagnosis not present

## 2021-02-23 ENCOUNTER — Other Ambulatory Visit: Payer: Self-pay | Admitting: Internal Medicine

## 2021-02-23 DIAGNOSIS — M0579 Rheumatoid arthritis with rheumatoid factor of multiple sites without organ or systems involvement: Secondary | ICD-10-CM

## 2021-02-23 NOTE — Telephone Encounter (Signed)
Patient needs follow up for long term plan to treat RA.

## 2021-03-02 DIAGNOSIS — M5136 Other intervertebral disc degeneration, lumbar region: Secondary | ICD-10-CM | POA: Diagnosis not present

## 2021-03-02 DIAGNOSIS — Z79891 Long term (current) use of opiate analgesic: Secondary | ICD-10-CM | POA: Diagnosis not present

## 2021-03-02 DIAGNOSIS — G894 Chronic pain syndrome: Secondary | ICD-10-CM | POA: Diagnosis not present

## 2021-03-02 DIAGNOSIS — M069 Rheumatoid arthritis, unspecified: Secondary | ICD-10-CM | POA: Diagnosis not present

## 2021-03-13 DIAGNOSIS — U071 COVID-19: Secondary | ICD-10-CM | POA: Diagnosis not present

## 2021-03-13 DIAGNOSIS — R531 Weakness: Secondary | ICD-10-CM | POA: Diagnosis not present

## 2021-03-13 DIAGNOSIS — I70229 Atherosclerosis of native arteries of extremities with rest pain, unspecified extremity: Secondary | ICD-10-CM | POA: Diagnosis not present

## 2021-03-22 ENCOUNTER — Other Ambulatory Visit: Payer: Self-pay | Admitting: Internal Medicine

## 2021-03-22 DIAGNOSIS — M0579 Rheumatoid arthritis with rheumatoid factor of multiple sites without organ or systems involvement: Secondary | ICD-10-CM

## 2021-03-22 NOTE — Telephone Encounter (Signed)
Next appt scheduled 04/11/21 with PCP.

## 2021-03-29 ENCOUNTER — Other Ambulatory Visit: Payer: Self-pay | Admitting: Internal Medicine

## 2021-03-29 DIAGNOSIS — I10 Essential (primary) hypertension: Secondary | ICD-10-CM

## 2021-03-29 DIAGNOSIS — F17200 Nicotine dependence, unspecified, uncomplicated: Secondary | ICD-10-CM

## 2021-03-30 ENCOUNTER — Encounter: Payer: Self-pay | Admitting: *Deleted

## 2021-03-30 NOTE — Progress Notes (Unsigned)

## 2021-04-04 DIAGNOSIS — Z79891 Long term (current) use of opiate analgesic: Secondary | ICD-10-CM | POA: Diagnosis not present

## 2021-04-04 DIAGNOSIS — M5136 Other intervertebral disc degeneration, lumbar region: Secondary | ICD-10-CM | POA: Diagnosis not present

## 2021-04-04 DIAGNOSIS — M069 Rheumatoid arthritis, unspecified: Secondary | ICD-10-CM | POA: Diagnosis not present

## 2021-04-04 DIAGNOSIS — G894 Chronic pain syndrome: Secondary | ICD-10-CM | POA: Diagnosis not present

## 2021-04-11 ENCOUNTER — Encounter: Payer: Medicare Other | Admitting: Internal Medicine

## 2021-04-26 ENCOUNTER — Other Ambulatory Visit: Payer: Self-pay | Admitting: Internal Medicine

## 2021-04-26 DIAGNOSIS — M0579 Rheumatoid arthritis with rheumatoid factor of multiple sites without organ or systems involvement: Secondary | ICD-10-CM

## 2021-04-26 NOTE — Telephone Encounter (Signed)
Hello! Patient was referred to Rheumatology for continued management of his RA. Please let me know if he has not had this appointment and I can send in the Prednisone then.

## 2021-04-27 ENCOUNTER — Ambulatory Visit: Payer: Medicare Other | Attending: Vascular Surgery

## 2021-04-27 ENCOUNTER — Other Ambulatory Visit: Payer: Self-pay

## 2021-04-27 DIAGNOSIS — R2681 Unsteadiness on feet: Secondary | ICD-10-CM | POA: Diagnosis not present

## 2021-04-27 DIAGNOSIS — R293 Abnormal posture: Secondary | ICD-10-CM | POA: Insufficient documentation

## 2021-04-27 DIAGNOSIS — R2689 Other abnormalities of gait and mobility: Secondary | ICD-10-CM | POA: Insufficient documentation

## 2021-04-27 DIAGNOSIS — M6281 Muscle weakness (generalized): Secondary | ICD-10-CM | POA: Diagnosis not present

## 2021-04-27 NOTE — Telephone Encounter (Signed)
Pt was called - no answer; left message on self-identified vm to call the office about Prednisone refill and rheumatology referral.

## 2021-04-28 NOTE — Therapy (Signed)
[facility] [address] [place], [place], [address] Phone: [phone]   Fax:  [phone]  Physical Therapy Evaluation  Patient Details  Name: [name] [name] MRN: [id number] Date of Birth: [birth year] Referring Provider (PT): left AKA   Encounter Date: 04/27/2021   PT End of Session - 04/27/21 1319     Visit Number 1    Number of Visits 25    Date for PT Re-Evaluation 07/22/21    Authorization Type UHC medicare with medicaid secondary so 10th visit progress note    PT Start Time 1318    PT Stop Time 1408    PT Time Calculation (min) 50 min    Equipment Utilized During Treatment Gait belt    Activity Tolerance Patient tolerated treatment well    Behavior During Therapy [facility] for tasks assessed/performed             Past Medical History:  Diagnosis Date   Anemia    Anemia 08/06/2012   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   Hypertension    states is borderline on medication; has been on med. x 5-6 yr.   Hypokalemia 05/30/2018   Impingement syndrome of shoulder region 10/2014   left   Insomnia 06/01/2015   Ischemic ulcer of toe of left foot (HCC)    Left shoulder pain 12/16/2015   Low back pain without sciatica 10/29/2009   Lupus ([place])    Peripheral vascular disease (HCC)    Pneumonia    RA (rheumatoid arthritis) ([place])    Rotator cuff tear 11/04/2014    Past Surgical History:  Procedure Laterality Date   ABDOMINAL AORTAGRAM  02/20/2019   ABDOMINAL AORTOGRAM W/LOWER EXTREMITY N/A 02/20/2019   Procedure: ABDOMINAL AORTOGRAM W/LOWER EXTREMITY;  Surgeon: Marty Heck, MD;  Location: [facility];  Service: Cardiovascular;  Laterality: N/A;   ACHILLES TENDON SURGERY Bilateral    AMPUTATION Left  05/11/2020   Procedure: LEFT ABOVE KNEE AMPUTATION;  Surgeon: Angelia Mould, MD;  Location: [facility];  Service: Vascular;  Laterality: Left;   ENDARTERECTOMY POPLITEAL Left 05/01/2020   Procedure: ENDARTERECTOMY POPLITEAL;  Surgeon: Waynetta Sandy, MD;  Location: [facility];  Service: Vascular;  Laterality: Left;   FASCIOTOMY Left 05/01/2020   Procedure: FASCIOTOMY;  Surgeon: Waynetta Sandy, MD;  Location: [facility];  Service: Vascular;  Laterality: Left;   FEMORAL-POPLITEAL BYPASS GRAFT Left 02/26/2019   Procedure: BYPASS GRAFT FEMORAL-POPLITEAL ARTERY LEFT LEG USING 60mm PROPATEN GRAFT;  Surgeon: Serafina Mitchell, MD;  Location: [facility];  Service: Vascular;  Laterality: Left;   INTRAOPERATIVE ARTERIOGRAM Left 01/22/2020   Procedure: INTRA OPERATIVE ARTERIOGRAM with administration of thrombolyics in Left femoral to popliteal bypass.;  Surgeon: Marty Heck, MD;  Location: [facility];  Service: Vascular;  Laterality: Left;   LEFT HEART CATH AND CORONARY ANGIOGRAPHY N/A 03/15/2017   Procedure: LEFT HEART CATH AND CORONARY ANGIOGRAPHY;  Surgeon: Nelva Bush, MD;  Location: [facility];  Service: Cardiovascular;  Laterality: N/A;   LOWER EXTREMITY INTERVENTION Left 04/29/2020   Procedure: LOWER EXTREMITY INTERVENTION- LYSIS;  Surgeon: Marty Heck, MD;  Location: [facility];  Service: Cardiovascular;  Laterality: Left;   OSTEOTOMY AND ULNAR SHORTENING Right 07/22/2002  PATCH ANGIOPLASTY Left 05/01/2020   Procedure: [place];  Surgeon: Waynetta Sandy, MD;  Location: [facility];  Service: Vascular;  Laterality: Left;   PERIPHERAL VASCULAR ATHERECTOMY  01/23/2020   Procedure: PERIPHERAL VASCULAR ATHERECTOMY;  Surgeon: Marty Heck, MD;  Location: [facility];  Service: Cardiovascular;;   PERIPHERAL VASCULAR BALLOON ANGIOPLASTY  01/23/2020   Procedure: PERIPHERAL VASCULAR BALLOON ANGIOPLASTY;   Surgeon: Marty Heck, MD;  Location: [facility];  Service: Cardiovascular;;   PERIPHERAL VASCULAR INTERVENTION Left 04/30/2020   Procedure: PERIPHERAL VASCULAR INTERVENTION;  Surgeon: Cherre Robins, MD;  Location: [facility];  Service: Cardiovascular;  Laterality: Left;   PERIPHERAL VASCULAR THROMBECTOMY N/A 01/23/2020   Procedure: lysis recheck;  Surgeon: Marty Heck, MD;  Location: [facility];  Service: Cardiovascular;  Laterality: N/A;  + [place]    PERIPHERAL VASCULAR THROMBECTOMY N/A 04/30/2020   Procedure: Lysis Recheck;  Surgeon: Cherre Robins, MD;  Location: [facility];  Service: Cardiovascular;  Laterality: N/A;   SHOULDER ARTHROSCOPY WITH BICEPSTENOTOMY Right 03/11/2015   Procedure: SHOULDER ARTHROSCOPY WITH BICEPSTENOTOMY;  Surgeon: Ninetta Lights, MD;  Location: [facility];  Service: Orthopedics;  Laterality: Right;   SHOULDER ARTHROSCOPY WITH DISTAL CLAVICLE RESECTION Left 11/19/2014   Procedure: SHOULDER ARTHROSCOPY WITH DISTAL CLAVICLE RESECTION;  Surgeon: Kathryne Hitch, MD;  Location: [facility];  Service: Orthopedics;  Laterality: Left;   SHOULDER ARTHROSCOPY WITH ROTATOR CUFF REPAIR AND SUBACROMIAL DECOMPRESSION Left 11/19/2014   Procedure: LEFT SHOULDER ARTHROSCOPY, DEBRIDEMENT DISTAL CLAVICLE EXCISION, ACROMIOPLASTY WITH ROTATOR CUFF REPAIR ;  Surgeon: Kathryne Hitch, MD;  Location: [facility];  Service: Orthopedics;  Laterality: Left;   THROMBECTOMY OF BYPASS GRAFT FEMORAL- POPLITEAL ARTERY Left 05/01/2020   Procedure: THROMBECTOMY OF LOWER EXTREMITY;  Surgeon: Waynetta Sandy, MD;  Location: [facility];  Service: Vascular;  Laterality: Left;   TRANSFORAMINAL LUMBAR INTERBODY FUSION (TLIF) WITH PEDICLE SCREW FIXATION 1 LEVEL N/A 01/03/2018   Procedure: TRANSFORAMINAL LUMBAR INTERBODY FUSION (TLIF) LUMBAR FIVE-SACRAL ONE;  Surgeon: Melina Schools, MD;  Location: [facility];  Service:  Orthopedics;  Laterality: N/A;   ULTRASOUND GUIDANCE FOR VASCULAR ACCESS Right 01/22/2020   Procedure: ULTRASOUND GUIDANCE FOR VASCULAR ACCESS Right Common Femoral Artery.;  Surgeon: Marty Heck, MD;  Location: [facility];  Service: Vascular;  Laterality: Right;   VIDEO ASSISTED THORACOSCOPY (VATS)/DECORTICATION Right 11/21/2011   drainage of empyema    There were no vitals filed for this visit.    Subjective Assessment - 04/27/21 1320     Subjective 60 y.o. male, PMH: RA, DM2, HTN, PAD, DDD,  s/p multiple L LE revascularizations discharged from hospital a 05/05/20.  At that time he was deemed to not be a candidate for further revasc procedures.  Dc'd home with a known bypass occlusion with a plan for AKA if worsening pain. Returned to ED 05/08/20  for increased pain and seeking amputation. s/p L AKA 05/11/20. Pt reports he got his prosthesis about 6 months ago from [facility] on [address] him again today at 3pm. Pt currently wearing his leg about 6 hours/day. Pt is walking around house with walker. Keeps prosthesis locked out. Uses w/c for longer community distances.    Patient is accompained by: --   friend, Don   Pertinent History RA, DM2, HTN, PAD, DDD,  s/p multiple L LE revascularizations discharged from hospital a 05/05/20.  At that time  he was deemed to not be a candidate for further revasc procedures.  Dc'd home with a known bypass occlusion with a plan for AKA if worsening pain. Returned to ED 05/08/20  for increased pain and seeking amputation. s/p L AKA 05/11/20    Patient Stated Goals Pt wants to be able to walk better.    Currently in Pain? Yes    Pain Score 3     Pain Location Leg    Pain Orientation Left    Pain Descriptors / Indicators --   uncomfortable feeling   Pain Type Chronic pain    Pain Onset More than a month ago    Pain Frequency Intermittent    Aggravating Factors  when overdoes it                [facility] PT Assessment - 04/27/21 1325        Assessment   Medical Diagnosis Deitra Mayo    Referring Provider (PT) left AKA    Onset Date/Surgical Date --   received prosthesis about 6 months ago in April 2022   Hand Dominance Left    Prior Therapy none      Precautions   Precautions Fall      Balance Screen   Has the patient fallen in the past 6 months Yes    How many times? 1   slipped in shower   Has the patient had a decrease in activity level because of a fear of falling?  No    Is the patient reluctant to leave their home because of a fear of falling?  No      Prior Function   Level of Independence Independent      Cognition   Overall Cognitive Status Within Functional Limits for tasks assessed      Sensation   Light Touch Appears Intact      Posture/Postural Control   Posture Comments right hand ulnar drift at knuckles      ROM / Strength   AROM / PROM / Strength AROM;Strength      AROM   Overall AROM Comments shoulders limited to about 100 degrees with left more limited. Gets cortizone shots in that shoulder. History of 3 shoulder surgeries on both shoulders. Left hip extension looks to be lacking about 20 degrees from neutral.      Strength   Strength Assessment Site Shoulder;Elbow;Hand;Hip;Knee;Ankle    Right/Left Shoulder Right;Left    Right Shoulder Flexion 3+/5    Left Shoulder Flexion 2-/5    Right/Left Elbow Right;Left    Right Elbow Flexion 4/5    Right Elbow Extension 4/5    Left Elbow Flexion 4/5    Left Elbow Extension 4/5    Right/Left hand Right;Left    Right Hand Gross Grasp Functional    Left Hand Gross Grasp Functional    Right/Left Hip Right;Left    Right Hip Flexion 5/5    Right Hip ABduction 4+/5    Left Hip Flexion 4/5    Left Hip ABduction 4/5    Right/Left Knee Right    Right Knee Flexion 5/5    Right Knee Extension 5/5    Right/Left Ankle Right    Right Ankle Dorsiflexion 4+/5      Transfers   Transfers Sit to Stand;Stand to Sit    Sit to Stand 5:  Supervision;Without upper extremity assist    Five time sit to stand comments  29.56 sec    Stand to Sit 5: Supervision  Ambulation/Gait   Ambulation/Gait Yes    Ambulation/Gait Assistance 4: Min guard;4: Min assist    Ambulation/Gait Assistance Details Pt initially walking with prosthesis locked out. Forward flexed posture taking too large a step with prosthesis. Trialed unlocking prosthesis. Pt unable to initiate knee flexion or when flexed knee unable to lock knee back out due to flexed posture.    Ambulation Distance (Feet) 75 Feet    Assistive device Rolling walker;Prosthesis    Gait Pattern Step-to pattern;Step-through pattern;Decreased stance time - left;Decreased hip/knee flexion - left;Decreased weight shift to left    Ambulation Surface Level;Indoor    Gait velocity 54.91 sec=0.65m/s             Prosthetics Assessment - 04/27/21 1329       Prosthetics   Prosthetic Care Dependent with Correct ply sock adjustment    Prosthetic Care Comments  Currently wearing 5 ply. Pt was instructed to tighten velcro strap further once standing and weight shifting more on to leg. Pt was also instructed not to pivot on prosthetic foot. PT called prosthetist, Mortimer Fries, at [facility] to discuss prosthesis as pt going there after session. PT leg him know that pt unable to unlock knee when trialed during gait and with lack of hip extension pt having trouble locking knee at heel strike. Also let him know that socket sliding around some into internal rotation. He is going to look to make some changes.    Donning prosthesis  Min assist    Doffing prosthesis  Modified independent (Device/Increase time)    Current prosthetic wear tolerance (days/week)  daily    Current prosthetic wear tolerance (#hours/day)  6 hours    Residual limb condition  intact, trimming some hair but not shaving    Prosthesis Description velcro suspension system with dual stance and swing control.                        Objective measurements completed on examination: See above findings.                PT Education - 04/28/21 0820     Education Details PT plan of care. Prosthetic education-see prosthetic section.    Person(s) Educated Patient    Methods Explanation    Comprehension Verbalized understanding              PT Short Term Goals - 04/28/21 1847       PT SHORT TERM GOAL #1   Title Pt will be able to tolerate wearing prosthesis all awake hours for improved function.    Baseline 6 hours/day    Time 4    Period Weeks    Status New    Target Date 05/26/21      PT SHORT TERM GOAL #2   Title Pt will be independent with prosthetic management for improved function.    Baseline currently needs cues    Time 4    Period Weeks    Status New    Target Date 05/26/21      PT SHORT TERM GOAL #3   Title Pt will be able to ambulate 200' with RW initiating prosthetic knee flexion on level surfaces supervision for improved household mobility.    Baseline 57' with RW min assist    Time 4    Period Weeks    Status New    Target Date 05/26/21      PT SHORT TERM GOAL #4   Title Pt will decrease  5 x sit to stand from 29.56 sec to <25 sec for improved balance and functional mobility.    Baseline 04/27/21 29.56 sec from chair with hands    Time 4    Period Weeks    Status New    Target Date 05/26/21      PT SHORT TERM GOAL #5   Title Berg Balance will be assessed and LTG written    Baseline TBD    Time 4    Period Weeks    Status New    Target Date 05/26/21               PT Long Term Goals - 04/28/21 1853       PT LONG TERM GOAL #1   Title Pt will be independent with HEP for strengthening, ROM and balance to continue gains on own.    Baseline no current HEP    Time 12    Period Weeks    Status New    Target Date 07/22/21      PT LONG TERM GOAL #2   Title Pt will increase gait speed to >0.28m/s for improved short community distances.     Baseline 04/27/21 0.47m/s    Time 12    Period Weeks    Status New    Target Date 07/22/21      PT LONG TERM GOAL #3   Title Pt will ambulate >400' with walker on varied level surfaces mod I for short community distances.    Baseline 04/27/21 75' min assit with RW    Time 12    Period Weeks    Status New    Target Date 07/22/21      PT LONG TERM GOAL #4   Title Pt will ambulate up/down 4 steps with rails mod I for improved community access.    Baseline unable    Time 12    Period Weeks    Status New    Target Date 07/22/21      PT LONG TERM GOAL #5   Title Berg TBD    Baseline TBD    Time 12    Period Weeks    Status New    Target Date 07/22/21                    Plan - 04/28/21 7371     Clinical Impression Statement Pt is 60 y/o male with PMH: RA, DM2, HTN, PAD, DDD,  s/p multiple L LE revascularizations, shoulder surgeries, low back surgery and left AKA 05/11/20. Pt received his prosthesis in April of 2022. Pt presents with decreased strength, decreased hip extension and balance deficits. Pt is high fall risk based on 5 x sit to stand of 29.56 sec using hands from chair. He was able to walk a short distance with prosthetic locked out in extension with gait speed of 0.47m/s indicating decreased safety with household mobility. Pt will benefit from prosthetic training and to address strength, ROM and balance deficits.    Personal Factors and Comorbidities Comorbidity 3+;Time since onset of injury/illness/exacerbation    Comorbidities RA, DM2, HTN, PAD, DDD,  s/p multiple L LE revascularizations discharged from hospital a 05/05/20.  At that time he was deemed to not be a candidate for further revasc procedures.  Dc'd home with a known bypass occlusion with a plan for AKA if worsening pain. Returned to ED 05/08/20  for increased pain and seeking amputation. s/p L AKA 05/11/20. Pt reports 3 shoulder  surgeries on each shoulder and low back surgery    Examination-Activity  Limitations Bathing;Locomotion Level;Transfers;Stairs;Stand;Squat;Lift    Examination-Participation Restrictions Church;Meal Prep    Stability/Clinical Decision Making Evolving/Moderate complexity    Clinical Decision Making Moderate    Rehab Potential Good    PT Frequency 2x / week   plus eval   PT Duration 12 weeks    PT Treatment/Interventions ADLs/Self Care Home Management;DME Instruction;Gait training;Stair training;Functional mobility training;Therapeutic activities;Therapeutic exercise;Balance training;Neuromuscular re-education;Manual techniques;Passive range of motion;Prosthetic Training;Patient/family education;Vestibular    PT Next Visit Plan Ask more about Home environment and PLOF. What changes to prosthetist make? Gait training with knee unlocked. Weight shifting over prosthesis.    Consulted and Agree with Plan of Care Patient;Other (Comment)   friend, Timmothy Sours            Patient will benefit from skilled therapeutic intervention in order to improve the following deficits and impairments:  Abnormal gait, Decreased mobility, Decreased strength, Impaired flexibility, Decreased range of motion, Decreased knowledge of use of DME, Prosthetic Dependency, Decreased balance, Decreased activity tolerance  Visit Diagnosis: Other abnormalities of gait and mobility  Muscle weakness (generalized)  Abnormal posture  Unsteadiness on feet     Problem List Patient Active Problem List   Diagnosis Date Noted   Paronychia of great toe 01/28/2021   Onychomycosis 01/28/2021   Critical limb ischemia with history of revascularization of same extremity ([place]) 04/28/2020   Limb ischemia 01/23/2020   Ischemic leg 01/22/2020   History of COVID-19 05/10/2019   PAD (peripheral artery disease) ([place])    History of critical lower limb ischemia 02/19/2019   S/P lumbar fusion, Lower back pain 01/03/2018   Coronary artery disease involving native coronary artery of native heart with unstable angina  pectoris [facility])    Preventative health care 03/13/2017   Transaminitis 12/16/2015   Bilateral shoulder pain 08/20/2012   Tobacco use disorder 08/20/2012   HTN (hypertension) 04/20/2011   HLD (hyperlipidemia) 10/08/2009   Rheumatoid arthritis ([place]) 08/11/2009    Electa Sniff, PT, DPT, NCS 04/28/2021, 6:58 PM  [facility] [address] [place], [place], [address] Phone: [phone]   Fax:  [phone]  Name: FENTON CANDEE MRN: [id number] Date of Birth: [birth year]

## 2021-05-04 ENCOUNTER — Other Ambulatory Visit: Payer: Self-pay

## 2021-05-04 ENCOUNTER — Ambulatory Visit: Payer: Medicare Other | Admitting: Rehabilitation

## 2021-05-04 ENCOUNTER — Encounter: Payer: Self-pay | Admitting: Rehabilitation

## 2021-05-04 DIAGNOSIS — R2681 Unsteadiness on feet: Secondary | ICD-10-CM | POA: Diagnosis not present

## 2021-05-04 DIAGNOSIS — M6281 Muscle weakness (generalized): Secondary | ICD-10-CM

## 2021-05-04 DIAGNOSIS — R293 Abnormal posture: Secondary | ICD-10-CM | POA: Diagnosis not present

## 2021-05-04 DIAGNOSIS — R2689 Other abnormalities of gait and mobility: Secondary | ICD-10-CM | POA: Diagnosis not present

## 2021-05-04 NOTE — Patient Instructions (Addendum)
Do each exercise 1-2  times per day Do each exercise 5-10 repetitions Hold each exercise for 2 seconds to feel your location  AT [place].  Try to find this position when standing still for activities.   USE TAPE ON FLOOR TO MARK THE MIDLINE POSITION which is even with middle of sink.  You also should try to feel with your limb pressure in socket.  You are trying to feel with limb what you used to feel with the bottom of your foot.  Side to Side Shift: Moving your hips only (not shoulders): move weight onto your left leg, HOLD/FEEL pressure in socket.  Move back to equal weight on each leg, HOLD/FEEL pressure in socket. Move weight onto your right leg, HOLD/FEEL pressure in socket. Move back to equal weight on each leg, HOLD/FEEL pressure in socket. Repeat.  Start with both hands on sink, progress to hand on prosthetic side only, then no hands.  Front to Back Shift: Moving your hips only (not shoulders): move your weight forward onto your toes, HOLD/FEEL pressure in socket. Move your weight back to equal Flat Foot on both legs, HOLD/FEEL  pressure in socket. Move your weight back onto your heels, HOLD/FEEL  pressure in socket. Move your weight back to equal on both legs, HOLD/FEEL  pressure in socket. Repeat.  Start with both hands on sink, progress to hand on prosthetic side only, then no hands.  Moving Cones / Cups: With equal weight on each leg: Hold on with one hand the first time, then progress to no hand supports. Move cups from one side of sink to the other. Place cups ~2" out of your reach, progress to 10" beyond reach.  Place one hand in middle of sink and reach with other hand. Do both arms.  Then hover one hand and move cups with other hand.  Overhead/Upward Reaching: alternated reaching up to top cabinets or ceiling if no cabinets present. Keep equal weight on each leg. Start with one hand support on counter while other  hand reaches and progress to no hand support with reaching.  ace one hand in middle of sink and reach with other hand. Do both arms.  Then hover one hand and move cups with other hand.  5.   Looking Over Shoulders: With equal weight on each leg: alternate turning to look over your shoulders with one hand support on counter as needed.  Start with head motions only to look in front of shoulder, then even with shoulder and progress to looking behind you. To look to side, move head /eyes, then shoulder on side looking pulls back, shift more weight to side looking and pull hip back. Place one hand in middle of sink and let go with other hand so your shoulder can pull back. Switch hands to look other way.   Then hover one hand and look over shoulder. If looking right, use left hand at sink. If looking left, use right hand at sink. 6.  Stepping with leg that is not amputated:  Move items under cabinet out of your way. Shift your hips/pelvis so weight on prosthesis. Tighten muscles in hip on prosthetic side.  SLOWLY step other leg so front of foot is in cabinet. Then step back to floor.   Access Code: [email or web address] URL: [email or web address] Date: 05/04/2021 Prepared by: Cameron Sprang  Exercises Prone Hip Flexor Stretch with Towel Roll (AKA) - 2-3 x daily - 7  x weekly - 1 sets - 3 reps - 2 mins hold Supine Hip Flexor Stretch with Weight - 2-3 x daily - 7 x weekly - 1 sets - 3 reps - 1 min hold

## 2021-05-04 NOTE — Therapy (Signed)
[facility] [address] [place], [place], [address] Phone: [phone]   Fax:  [phone]  Physical Therapy Treatment  Patient Details  Name: [name] [name] MRN: [id number] Date of Birth: July 01, 1960 Referring Provider (PT): left AKA   Encounter Date: 05/04/2021   PT End of Session - 05/04/21 1253     Visit Number 2    Number of Visits 25    Date for PT Re-Evaluation 07/22/21    Authorization Type UHC medicare with medicaid secondary so 10th visit progress note    PT Start Time 0802    PT Stop Time 0845    PT Time Calculation (min) 43 min    Equipment Utilized During Treatment Gait belt    Activity Tolerance Patient tolerated treatment well    Behavior During Therapy [facility] for tasks assessed/performed             Past Medical History:  Diagnosis Date   Anemia    Anemia 08/06/2012   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   Hypertension    states is borderline on medication; has been on med. x 5-6 yr.   Hypokalemia 05/30/2018   Impingement syndrome of shoulder region 10/2014   left   Insomnia 06/01/2015   Ischemic ulcer of toe of left foot (HCC)    Left shoulder pain 12/16/2015   Low back pain without sciatica 10/29/2009   Lupus ([place])    Peripheral vascular disease (HCC)    Pneumonia    RA (rheumatoid arthritis) ([place])    Rotator cuff tear 11/04/2014    Past Surgical History:  Procedure Laterality Date   ABDOMINAL AORTAGRAM  02/20/2019   ABDOMINAL AORTOGRAM W/LOWER EXTREMITY N/A 02/20/2019   Procedure: ABDOMINAL AORTOGRAM W/LOWER EXTREMITY;  Surgeon: Marty Heck, MD;  Location: [facility];  Service: Cardiovascular;  Laterality: N/A;   ACHILLES TENDON SURGERY Bilateral    AMPUTATION Left  05/11/2020   Procedure: LEFT ABOVE KNEE AMPUTATION;  Surgeon: Angelia Mould, MD;  Location: [facility];  Service: Vascular;  Laterality: Left;   ENDARTERECTOMY POPLITEAL Left 05/01/2020   Procedure: ENDARTERECTOMY POPLITEAL;  Surgeon: Waynetta Sandy, MD;  Location: [facility];  Service: Vascular;  Laterality: Left;   FASCIOTOMY Left 05/01/2020   Procedure: FASCIOTOMY;  Surgeon: Waynetta Sandy, MD;  Location: [facility];  Service: Vascular;  Laterality: Left;   FEMORAL-POPLITEAL BYPASS GRAFT Left 02/26/2019   Procedure: BYPASS GRAFT FEMORAL-POPLITEAL ARTERY LEFT LEG USING 54mm PROPATEN GRAFT;  Surgeon: Serafina Mitchell, MD;  Location: [facility];  Service: Vascular;  Laterality: Left;   INTRAOPERATIVE ARTERIOGRAM Left 01/22/2020   Procedure: INTRA OPERATIVE ARTERIOGRAM with administration of thrombolyics in Left femoral to popliteal bypass.;  Surgeon: Marty Heck, MD;  Location: [facility];  Service: Vascular;  Laterality: Left;   LEFT HEART CATH AND CORONARY ANGIOGRAPHY N/A 03/15/2017   Procedure: LEFT HEART CATH AND CORONARY ANGIOGRAPHY;  Surgeon: Nelva Bush, MD;  Location: [facility];  Service: Cardiovascular;  Laterality: N/A;   LOWER EXTREMITY INTERVENTION Left 04/29/2020   Procedure: LOWER EXTREMITY INTERVENTION- LYSIS;  Surgeon: Marty Heck, MD;  Location: [facility];  Service: Cardiovascular;  Laterality: Left;   OSTEOTOMY AND ULNAR SHORTENING Right 07/22/2002  PATCH ANGIOPLASTY Left 05/01/2020   Procedure: [place];  Surgeon: Waynetta Sandy, MD;  Location: [facility];  Service: Vascular;  Laterality: Left;   PERIPHERAL VASCULAR ATHERECTOMY  01/23/2020   Procedure: PERIPHERAL VASCULAR ATHERECTOMY;  Surgeon: Marty Heck, MD;  Location: [facility];  Service: Cardiovascular;;   PERIPHERAL VASCULAR BALLOON ANGIOPLASTY  01/23/2020   Procedure: PERIPHERAL VASCULAR BALLOON ANGIOPLASTY;   Surgeon: Marty Heck, MD;  Location: [facility];  Service: Cardiovascular;;   PERIPHERAL VASCULAR INTERVENTION Left 04/30/2020   Procedure: PERIPHERAL VASCULAR INTERVENTION;  Surgeon: Cherre Robins, MD;  Location: [facility];  Service: Cardiovascular;  Laterality: Left;   PERIPHERAL VASCULAR THROMBECTOMY N/A 01/23/2020   Procedure: lysis recheck;  Surgeon: Marty Heck, MD;  Location: [facility];  Service: Cardiovascular;  Laterality: N/A;  + [place]    PERIPHERAL VASCULAR THROMBECTOMY N/A 04/30/2020   Procedure: Lysis Recheck;  Surgeon: Cherre Robins, MD;  Location: [facility];  Service: Cardiovascular;  Laterality: N/A;   SHOULDER ARTHROSCOPY WITH BICEPSTENOTOMY Right 03/11/2015   Procedure: SHOULDER ARTHROSCOPY WITH BICEPSTENOTOMY;  Surgeon: Ninetta Lights, MD;  Location: [facility];  Service: Orthopedics;  Laterality: Right;   SHOULDER ARTHROSCOPY WITH DISTAL CLAVICLE RESECTION Left 11/19/2014   Procedure: SHOULDER ARTHROSCOPY WITH DISTAL CLAVICLE RESECTION;  Surgeon: Kathryne Hitch, MD;  Location: [facility];  Service: Orthopedics;  Laterality: Left;   SHOULDER ARTHROSCOPY WITH ROTATOR CUFF REPAIR AND SUBACROMIAL DECOMPRESSION Left 11/19/2014   Procedure: LEFT SHOULDER ARTHROSCOPY, DEBRIDEMENT DISTAL CLAVICLE EXCISION, ACROMIOPLASTY WITH ROTATOR CUFF REPAIR ;  Surgeon: Kathryne Hitch, MD;  Location: [facility];  Service: Orthopedics;  Laterality: Left;   THROMBECTOMY OF BYPASS GRAFT FEMORAL- POPLITEAL ARTERY Left 05/01/2020   Procedure: THROMBECTOMY OF LOWER EXTREMITY;  Surgeon: Waynetta Sandy, MD;  Location: [facility];  Service: Vascular;  Laterality: Left;   TRANSFORAMINAL LUMBAR INTERBODY FUSION (TLIF) WITH PEDICLE SCREW FIXATION 1 LEVEL N/A 01/03/2018   Procedure: TRANSFORAMINAL LUMBAR INTERBODY FUSION (TLIF) LUMBAR FIVE-SACRAL ONE;  Surgeon: Melina Schools, MD;  Location: [facility];  Service:  Orthopedics;  Laterality: N/A;   ULTRASOUND GUIDANCE FOR VASCULAR ACCESS Right 01/22/2020   Procedure: ULTRASOUND GUIDANCE FOR VASCULAR ACCESS Right Common Femoral Artery.;  Surgeon: Marty Heck, MD;  Location: [facility];  Service: Vascular;  Laterality: Right;   VIDEO ASSISTED THORACOSCOPY (VATS)/DECORTICATION Right 11/21/2011   drainage of empyema    There were no vitals filed for this visit.   Subjective Assessment - 05/04/21 0804     Subjective Went to [facility] and he made adjustments to knee joint.  Pt reports he did not make any adjustments to socket.    Pertinent History RA, DM2, HTN, PAD, DDD,  s/p multiple L LE revascularizations discharged from hospital a 05/05/20.  At that time he was deemed to not be a candidate for further revasc procedures.  Dc'd home with a known bypass occlusion with a plan for AKA if worsening pain. Returned to ED 05/08/20  for increased pain and seeking amputation. s/p L AKA 05/11/20    Patient Stated Goals Pt wants to be able to walk better.    Currently in Pain? 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      Ambulation/Gait   Ambulation/Gait Yes    Ambulation/Gait Assistance 4: Min assist    Ambulation/Gait Assistance Details Pt reports that [facility] at [place] did adjust knee joint so that it may move more freely.  Upon standing and performing gait, he continues to not have enough hip extension to always lock knee out during stance.  PT cued pt to kick prosthesis out and land on heel to ensure locked prior to moving over prosthesis. He did better when cued this way and cues for more upright posture (at least intermittently looking forward).    Ambulation Distance (Feet) 80 Feet   45   Assistive device Rolling walker;Prosthesis    Gait Pattern Step-to pattern;Step-through pattern;Decreased stance time - left;Decreased hip/knee flexion - left;Decreased weight shift to left    Ambulation Surface Level;Indoor      Exercises   Exercises Other Exercises    Other Exercises  Performed supine hip flex stretch with prosthesis donned and off EOM x 2 mins.  Also performed in prone x 2-3 mins with pt propping on elbows to increase stretch at hip.  Added these to HEP.  Initiated sink HEP with lateral weight shifts, holding each position including midline x 5 secs.  Performed x 10 reps with cues for being closer to counter for more upright posture and for safer foot placement to allow knee to remain locked in stance.      Prosthetics   Prosthetic Care Comments  Provided continued education regarding wear time and wearing 2 hours, 2x/day to begin with and if by Friday he continues to tolerate well, will move up to 3 hours, 2x/day.  He does report he is wearing 2-3 hours at a time and then removing but sometimes putting prosthesis back on and sometimes not.  Also educated on adjusting ply socks as he has 5 ply donned today and reports he does at times have pain in bottom of residual limb.  Provided education and demo to pt and friend  regarding donning prosthesis, shifting onto prosthesis and tightening lanyard but also if pants are getting stuck to remove and then tighten again if needed.    Current prosthetic wear tolerance (days/week)  daily    Current prosthetic wear tolerance (#hours/day)  2-3 hours, sometimes 2x/day    Current prosthetic weight-bearing tolerance (hours/day)  Tolerates up to 5 mins of standing    Residual limb condition  no issues reported    Education Provided Correct ply sock adjustment;Proper  Donning;Proper wear schedule/adjustment    Person(s) Educated Patient;Other (comment)   friend   Education Method Explanation;Demonstration;Verbal cues    Education Method Needs further instruction    Donning Prosthesis Supervision;Minimal assist                 Do each exercise 1-2  times per day Do each exercise 5-10 repetitions Hold each exercise for 2 seconds to feel your location   AT [place].  Try to find this position when standing still for activities.    USE TAPE ON FLOOR TO MARK THE MIDLINE POSITION which is even with middle of sink.  You also should try to feel with your limb pressure in socket.  You are trying to feel with limb what you used to feel with the bottom of your foot.   Side to Side Shift: Moving your hips only (not shoulders): move weight onto your left leg, HOLD/FEEL pressure in socket.  Move back to equal weight on each leg, HOLD/FEEL pressure in socket. Move weight onto your right leg, HOLD/FEEL pressure in socket. Move back to equal weight on each leg, HOLD/FEEL pressure in socket. Repeat.  Start with both hands on sink, progress to hand on prosthetic side only, then no hands.  Front to Back Shift: Moving your hips only (not shoulders): move your weight forward onto your toes, HOLD/FEEL pressure in socket. Move your weight back to equal Flat Foot on both legs, HOLD/FEEL  pressure in socket. Move your  weight back onto your heels, HOLD/FEEL  pressure in socket. Move your weight back to equal on both legs, HOLD/FEEL  pressure in socket. Repeat.  Start with both hands on sink, progress to hand on prosthetic side only, then no hands.  Moving Cones / Cups: With equal weight on each leg: Hold on with one hand the first time, then progress to no hand supports. Move cups from one side of sink to the other. Place cups ~2" out of your reach, progress to 10" beyond reach.  Place one hand in middle of sink and reach with other hand. Do both arms.  Then hover one hand and move cups with other hand.  Overhead/Upward Reaching: alternated reaching up to top cabinets or ceiling if no cabinets present. Keep equal weight on each leg. Start with one hand support on counter while other hand reaches and progress to no hand support with reaching.  ace one hand in middle of sink and reach with other hand. Do both arms.  Then hover one hand and move cups with other hand.  5.   Looking Over Shoulders: With equal weight on each leg: alternate turning to look over your shoulders with one hand support on counter as needed.  Start with head motions only to look in front of shoulder, then even with shoulder and progress to looking behind you. To look to side, move head /eyes, then shoulder on side looking pulls back, shift more weight to side looking and pull hip back. Place one hand in middle of sink and let go with other hand so your shoulder can pull back. Switch hands to look other way.   Then hover one hand and look over shoulder. If looking right, use left hand at sink. If looking left, use right hand at sink. 6.  Stepping with leg that is not amputated:  Move items under cabinet out of your way. Shift your hips/pelvis so weight on prosthesis.  Tighten muscles in hip on prosthetic side.  SLOWLY step other leg so front of foot is in cabinet. Then step back to floor.    Access Code: [email or web address] URL:  [email or web address] Date: 05/04/2021 Prepared by: Cameron Sprang   Exercises Prone Hip Flexor Stretch with Towel Roll (AKA) - 2-3 x daily - 7 x weekly - 1 sets - 3 reps - 2 mins hold Supine Hip Flexor Stretch with Weight - 2-3 x daily - 7 x weekly - 1 sets - 3 reps - 1 min hold             Last Modified by Berniece Andreas, PT on 05/04/2021 at  8:44 AM         PT Short Term Goals - 04/28/21 1847       PT SHORT TERM GOAL #1   Title Pt will be able to tolerate wearing prosthesis all awake hours for improved function.    Baseline 6 hours/day    Time 4    Period Weeks    Status New    Target Date 05/26/21      PT SHORT TERM GOAL #2   Title Pt will be independent with prosthetic management for improved function.    Baseline currently needs cues    Time 4    Period Weeks    Status New    Target Date 05/26/21      PT SHORT TERM GOAL #3   Title Pt will be able to ambulate 200' with RW initiating prosthetic knee flexion on level surfaces supervision for improved household mobility.    Baseline 76' with RW min assist    Time 4    Period Weeks    Status New    Target Date 05/26/21      PT SHORT TERM GOAL #4   Title Pt will decrease 5 x sit to stand from 29.56 sec to <25 sec for improved balance and functional mobility.    Baseline 04/27/21 29.56 sec from chair with hands    Time 4    Period Weeks    Status New    Target Date 05/26/21      PT SHORT TERM GOAL #5   Title Berg Balance will be assessed and LTG written    Baseline TBD    Time 4    Period Weeks    Status New    Target Date 05/26/21               PT Long Term Goals - 04/28/21 1853       PT LONG TERM GOAL #1   Title Pt will be independent with HEP for strengthening, ROM and balance to continue gains on own.    Baseline no current HEP    Time 12    Period Weeks    Status New    Target Date 07/22/21      PT LONG TERM GOAL #2   Title Pt will increase gait speed to >0.46m/s for  improved short community distances.    Baseline 04/27/21 0.52m/s    Time 12    Period Weeks    Status New    Target Date 07/22/21      PT LONG TERM GOAL #3   Title Pt will ambulate >400' with walker on varied level surfaces mod I for short community distances.    Baseline 04/27/21 75' min assit with RW    Time 12    Period Weeks    Status New  Target Date 07/22/21      PT LONG TERM GOAL #4   Title Pt will ambulate up/down 4 steps with rails mod I for improved community access.    Baseline unable    Time 12    Period Weeks    Status New    Target Date 07/22/21      PT LONG TERM GOAL #5   Title Berg TBD    Baseline TBD    Time 12    Period Weeks    Status New    Target Date 07/22/21                   Plan - 05/04/21 1254     Clinical Impression Statement Skilled session focused on re-assessing gait with changes made by Mortimer Fries at [place].  He may need to adjust socket to accomodate for hip flex at this point.  He seemed to do better with cues for kicking prosthesis out and ensuring heel strike for knee to lock.  Feel that he can walk very short distances with min A from friend present in session with use of gait belt.  Also initaited HEP for hip flexibility and standing at sink.  See pt instruction for details.    Personal Factors and Comorbidities Comorbidity 3+;Time since onset of injury/illness/exacerbation    Comorbidities RA, DM2, HTN, PAD, DDD,  s/p multiple L LE revascularizations discharged from hospital a 05/05/20.  At that time he was deemed to not be a candidate for further revasc procedures.  Dc'd home with a known bypass occlusion with a plan for AKA if worsening pain. Returned to ED 05/08/20  for increased pain and seeking amputation. s/p L AKA 05/11/20. Pt reports 3 shoulder surgeries on each shoulder and low back surgery    Examination-Activity Limitations Bathing;Locomotion Level;Transfers;Stairs;Stand;Squat;Lift    Examination-Participation Restrictions  Church;Meal Prep    Stability/Clinical Decision Making Evolving/Moderate complexity    Rehab Potential Good    PT Frequency 2x / week   plus eval   PT Duration 12 weeks    PT Treatment/Interventions ADLs/Self Care Home Management;DME Instruction;Gait training;Stair training;Functional mobility training;Therapeutic activities;Therapeutic exercise;Balance training;Neuromuscular re-education;Manual techniques;Passive range of motion;Prosthetic Training;Patient/family education;Vestibular    PT Next Visit Plan Continue with sink HEP (he was given copy at last session), gait with knee unlocked, Weight shifting over prosthesis.    PT Home Exercise Plan Em call Mortimer Fries if you think he needs to flex socket to accomodate for hip flex tightness.    Consulted and Agree with Plan of Care Patient;Other (Comment)   friend, Timmothy Sours            Patient will benefit from skilled therapeutic intervention in order to improve the following deficits and impairments:  Abnormal gait, Decreased mobility, Decreased strength, Impaired flexibility, Decreased range of motion, Decreased knowledge of use of DME, Prosthetic Dependency, Decreased balance, Decreased activity tolerance  Visit Diagnosis: Muscle weakness (generalized)  Other abnormalities of gait and mobility  Abnormal posture  Unsteadiness on feet     Problem List Patient Active Problem List   Diagnosis Date Noted   Paronychia of great toe 01/28/2021   Onychomycosis 01/28/2021   Critical limb ischemia with history of revascularization of same extremity ([place]) 04/28/2020   Limb ischemia 01/23/2020   Ischemic leg 01/22/2020   History of COVID-19 05/10/2019   PAD (peripheral artery disease) ([place])    History of critical lower limb ischemia 02/19/2019   S/P lumbar fusion, Lower back pain 01/03/2018   Coronary  artery disease involving native coronary artery of native heart with unstable angina pectoris [facility])    Preventative health care 03/13/2017    Transaminitis 12/16/2015   Bilateral shoulder pain 08/20/2012   Tobacco use disorder 08/20/2012   HTN (hypertension) 04/20/2011   HLD (hyperlipidemia) 10/08/2009   Rheumatoid arthritis ([place]) 08/11/2009    Tyleek Smick, Betha Loa, PT 05/04/2021, 1:00 PM  [facility] [address] [place], [place], [address] Phone: [phone]   Fax:  [phone]  Name: [name] [name] MRN: [id number] Date of Birth: 1961/02/26

## 2021-05-06 ENCOUNTER — Ambulatory Visit: Payer: Medicare Other

## 2021-05-09 DIAGNOSIS — G894 Chronic pain syndrome: Secondary | ICD-10-CM | POA: Diagnosis not present

## 2021-05-09 DIAGNOSIS — M5136 Other intervertebral disc degeneration, lumbar region: Secondary | ICD-10-CM | POA: Diagnosis not present

## 2021-05-10 ENCOUNTER — Encounter: Payer: Self-pay | Admitting: Physical Therapy

## 2021-05-10 ENCOUNTER — Ambulatory Visit: Payer: Medicare Other | Admitting: Physical Therapy

## 2021-05-10 ENCOUNTER — Other Ambulatory Visit: Payer: Self-pay

## 2021-05-10 DIAGNOSIS — M6281 Muscle weakness (generalized): Secondary | ICD-10-CM

## 2021-05-10 DIAGNOSIS — R2681 Unsteadiness on feet: Secondary | ICD-10-CM | POA: Diagnosis not present

## 2021-05-10 DIAGNOSIS — R2689 Other abnormalities of gait and mobility: Secondary | ICD-10-CM

## 2021-05-10 DIAGNOSIS — R293 Abnormal posture: Secondary | ICD-10-CM | POA: Diagnosis not present

## 2021-05-11 NOTE — Therapy (Signed)
[facility] [address] [place], [place], [address] Phone: [phone]   Fax:  [phone]  Physical Therapy Treatment  Patient Details  Name: [name] [name] MRN: [id number] Date of Birth: Jun 19, 1961 Referring Provider (PT): left AKA   Encounter Date: 05/10/2021   PT End of Session - 05/10/21 1451     Visit Number 3    Number of Visits 25    Date for PT Re-Evaluation 07/22/21    Authorization Type [employer]HC medicare with medicaid secondary so 10th visit progress note    PT Start Time 1449    PT Stop Time 1530    PT Time Calculation (min) 41 min    Equipment Utilized During Treatment Gait belt    Activity Tolerance Patient tolerated treatment well    Behavior During Therapy [facility] for tasks assessed/performed             Past Medical History:  Diagnosis Date   Anemia    Anemia 08/06/2012   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   Hypertension    states is borderline on medication; has been on med. x 5-6 yr.   Hypokalemia 05/30/2018   Impingement syndrome of shoulder region 10/2014   left   Insomnia 06/01/2015   Ischemic ulcer of toe of left foot (HCC)    Left shoulder pain 12/16/2015   Low back pain without sciatica 10/29/2009   Lupus ([place])    Peripheral vascular disease (HCC)    Pneumonia    RA (rheumatoid arthritis) ([place])    Rotator cuff tear 11/04/2014    Past Surgical History:  Procedure Laterality Date   ABDOMINAL AORTAGRAM  02/20/2019   ABDOMINAL AORTOGRAM W/LOWER EXTREMITY N/A 02/20/2019   Procedure: ABDOMINAL AORTOGRAM W/LOWER EXTREMITY;  Surgeon: Marty Heck, MD;  Location: [facility];  Service: Cardiovascular;  Laterality: N/A;   ACHILLES TENDON SURGERY Bilateral    AMPUTATION Left  05/11/2020   Procedure: LEFT ABOVE KNEE AMPUTATION;  Surgeon: Angelia Mould, MD;  Location: [place] [facility];  Service: Vascular;  Laterality: Left;   ENDARTERECTOMY POPLITEAL Left 05/01/2020   Procedure: ENDARTERECTOMY POPLITEAL;  Surgeon: Waynetta Sandy, MD;  Location: [facility];  Service: Vascular;  Laterality: Left;   FASCIOTOMY Left 05/01/2020   Procedure: FASCIOTOMY;  Surgeon: Waynetta Sandy, MD;  Location: [facility];  Service: Vascular;  Laterality: Left;   FEMORAL-POPLITEAL BYPASS GRAFT Left 02/26/2019   Procedure: BYPASS GRAFT FEMORAL-POPLITEAL ARTERY LEFT LEG USING 52mm PROPATEN GRAFT;  Surgeon: Serafina Mitchell, MD;  Location: [facility];  Service: Vascular;  Laterality: Left;   INTRAOPERATIVE ARTERIOGRAM Left 01/22/2020   Procedure: INTRA OPERATIVE ARTERIOGRAM with administration of thrombolyics in Left femoral to popliteal bypass.;  Surgeon: Marty Heck, MD;  Location: [facility];  Service: Vascular;  Laterality: Left;   LEFT HEART CATH AND CORONARY ANGIOGRAPHY N/A 03/15/2017   Procedure: LEFT HEART CATH AND CORONARY ANGIOGRAPHY;  Surgeon: Nelva Bush, MD;  Location: [facility];  Service: Cardiovascular;  Laterality: N/A;   LOWER EXTREMITY INTERVENTION Left 04/29/2020   Procedure: LOWER EXTREMITY INTERVENTION- LYSIS;  Surgeon: Marty Heck, MD;  Location: [facility];  Service: Cardiovascular;  Laterality: Left;   OSTEOTOMY AND ULNAR SHORTENING Right 07/22/2002  PATCH ANGIOPLASTY Left 05/01/2020   Procedure: [place];  Surgeon: Waynetta Sandy, MD;  Location: [facility];  Service: Vascular;  Laterality: Left;   PERIPHERAL VASCULAR ATHERECTOMY  01/23/2020   Procedure: PERIPHERAL VASCULAR ATHERECTOMY;  Surgeon: Marty Heck, MD;  Location: [facility];  Service: Cardiovascular;;   PERIPHERAL VASCULAR BALLOON ANGIOPLASTY  01/23/2020   Procedure: PERIPHERAL VASCULAR BALLOON ANGIOPLASTY;   Surgeon: Marty Heck, MD;  Location: [facility];  Service: Cardiovascular;;   PERIPHERAL VASCULAR INTERVENTION Left 04/30/2020   Procedure: PERIPHERAL VASCULAR INTERVENTION;  Surgeon: Cherre Robins, MD;  Location: [facility];  Service: Cardiovascular;  Laterality: Left;   PERIPHERAL VASCULAR THROMBECTOMY N/A 01/23/2020   Procedure: lysis recheck;  Surgeon: Marty Heck, MD;  Location: [facility];  Service: Cardiovascular;  Laterality: N/A;  + [place]    PERIPHERAL VASCULAR THROMBECTOMY N/A 04/30/2020   Procedure: Lysis Recheck;  Surgeon: Cherre Robins, MD;  Location: [facility];  Service: Cardiovascular;  Laterality: N/A;   SHOULDER ARTHROSCOPY WITH BICEPSTENOTOMY Right 03/11/2015   Procedure: SHOULDER ARTHROSCOPY WITH BICEPSTENOTOMY;  Surgeon: Ninetta Lights, MD;  Location: [facility];  Service: Orthopedics;  Laterality: Right;   SHOULDER ARTHROSCOPY WITH DISTAL CLAVICLE RESECTION Left 11/19/2014   Procedure: SHOULDER ARTHROSCOPY WITH DISTAL CLAVICLE RESECTION;  Surgeon: Kathryne Hitch, MD;  Location: [facility];  Service: Orthopedics;  Laterality: Left;   SHOULDER ARTHROSCOPY WITH ROTATOR CUFF REPAIR AND SUBACROMIAL DECOMPRESSION Left 11/19/2014   Procedure: LEFT SHOULDER ARTHROSCOPY, DEBRIDEMENT DISTAL CLAVICLE EXCISION, ACROMIOPLASTY WITH ROTATOR CUFF REPAIR ;  Surgeon: Kathryne Hitch, MD;  Location: [facility];  Service: Orthopedics;  Laterality: Left;   THROMBECTOMY OF BYPASS GRAFT FEMORAL- POPLITEAL ARTERY Left 05/01/2020   Procedure: THROMBECTOMY OF LOWER EXTREMITY;  Surgeon: Waynetta Sandy, MD;  Location: [facility];  Service: Vascular;  Laterality: Left;   TRANSFORAMINAL LUMBAR INTERBODY FUSION (TLIF) WITH PEDICLE SCREW FIXATION 1 LEVEL N/A 01/03/2018   Procedure: TRANSFORAMINAL LUMBAR INTERBODY FUSION (TLIF) LUMBAR FIVE-SACRAL ONE;  Surgeon: Melina Schools, MD;  Location: [facility];  Service:  Orthopedics;  Laterality: N/A;   ULTRASOUND GUIDANCE FOR VASCULAR ACCESS Right 01/22/2020   Procedure: ULTRASOUND GUIDANCE FOR VASCULAR ACCESS Right Common Femoral Artery.;  Surgeon: Marty Heck, MD;  Location: [facility];  Service: Vascular;  Laterality: Right;   VIDEO ASSISTED THORACOSCOPY (VATS)/DECORTICATION Right 11/21/2011   drainage of empyema    There were no vitals filed for this visit.   Subjective Assessment - 05/10/21 1450     Subjective No new complaints. No falls or pain to report.    Patient is accompained by: --   friend, Don   Pertinent History RA, DM2, HTN, PAD, DDD,  s/p multiple L LE revascularizations discharged from hospital a 05/05/20.  At that time he was deemed to not be a candidate for further revasc procedures.  Dc'd home with a known bypass occlusion with a plan for AKA if worsening pain. Returned to ED 05/08/20  for increased pain and seeking amputation. s/p L AKA 05/11/20    Patient Stated Goals Pt wants to be able to walk better.    Currently in Pain? No/denies    Pain Score 0-No pain                    [facility] Adult PT Treatment/Exercise - 05/10/21 1453       Transfers   Transfers  Sit to Stand;Stand to Sit    Sit to Stand 5: Supervision;Without upper extremity assist    Stand to Sit 5: Supervision;With upper extremity assist;To chair/3-in-1      Ambulation/Gait   Ambulation/Gait Yes    Ambulation/Gait Assistance 4: Min assist    Ambulation/Gait Assistance Details cues to slow down, for heel strike and then hip/pelvic movements to shift over prosthesis prior to stepping right foot. knee blocked as knee tends to buckle due to mal- alignment and poor weight shifting. Once pt is cued to slow down, weight shift and engage at hip pt able to lock prosthesis in stance. PTA hand there for safety with all of gait. Less cues/assist needed with second and third gait reps.    Ambulation Distance (Feet) 50 Feet   x 3 reps   Assistive device Rolling  walker;Prosthesis    Gait Pattern Step-to pattern;Step-through pattern;Decreased stance time - left;Decreased hip/knee flexion - left;Decreased weight shift to left    Ambulation Surface Level;Indoor      Prosthetics   Prosthetic Care Comments  discussed sweat prevention, signs of sweating. (pt reports liner/prosthesis falling off due to sweat at times). Pt educated on signs of sweating under liner and management techniques such as drying limb/liner often and use of antiperspirant on limb prior to donning liner.    Current prosthetic wear tolerance (days/week)  daily    Current prosthetic wear tolerance (#hours/day)  1-2 hours, 2-3 times a day    Residual limb condition  no issues reported    Education Provided Proper wear schedule/adjustment;Proper weight-bearing schedule/adjustment;Other (comment)   see prosthetic care comments above   Person(s) Educated Patient;Other (comment)   friend   Education Method Explanation;Demonstration;Verbal cues    Education Method Verbalized understanding;Returned demonstration;Verbal cues required;Needs further instruction    Donning Prosthesis Supervision;Minimal assist                   PT Short Term Goals - 04/28/21 1847       PT SHORT TERM GOAL #1   Title Pt will be able to tolerate wearing prosthesis all awake hours for improved function.    Baseline 6 hours/day    Time 4    Period Weeks    Status New    Target Date 05/26/21      PT SHORT TERM GOAL #2   Title Pt will be independent with prosthetic management for improved function.    Baseline currently needs cues    Time 4    Period Weeks    Status New    Target Date 05/26/21      PT SHORT TERM GOAL #3   Title Pt will be able to ambulate 200' with RW initiating prosthetic knee flexion on level surfaces supervision for improved household mobility.    Baseline 23' with RW min assist    Time 4    Period Weeks    Status New    Target Date 05/26/21      PT SHORT TERM GOAL #4    Title Pt will decrease 5 x sit to stand from 29.56 sec to <25 sec for improved balance and functional mobility.    Baseline 04/27/21 29.56 sec from chair with hands    Time 4    Period Weeks    Status New    Target Date 05/26/21      PT SHORT TERM GOAL #5   Title Berg Balance will be assessed and LTG written    Baseline TBD  Time 4    Period Weeks    Status New    Target Date 05/26/21               PT Long Term Goals - 04/28/21 1853       PT LONG TERM GOAL #1   Title Pt will be independent with HEP for strengthening, ROM and balance to continue gains on own.    Baseline no current HEP    Time 12    Period Weeks    Status New    Target Date 07/22/21      PT LONG TERM GOAL #2   Title Pt will increase gait speed to >0.92m/s for improved short community distances.    Baseline 04/27/21 0.7m/s    Time 12    Period Weeks    Status New    Target Date 07/22/21      PT LONG TERM GOAL #3   Title Pt will ambulate >400' with walker on varied level surfaces mod I for short community distances.    Baseline 04/27/21 75' min assit with RW    Time 12    Period Weeks    Status New    Target Date 07/22/21      PT LONG TERM GOAL #4   Title Pt will ambulate up/down 4 steps with rails mod I for improved community access.    Baseline unable    Time 12    Period Weeks    Status New    Target Date 07/22/21      PT LONG TERM GOAL #5   Title Berg TBD    Baseline TBD    Time 12    Period Weeks    Status New    Target Date 07/22/21                   Plan - 05/10/21 1452     Clinical Impression Statement Today's skilled session continued to focus on prosthetic education and gait training with prosthesis/RW with cues/assist needed. Rest breaks taken as needed due to fatigue with activity with no other issues noted or reported in session. The pt is making progress toward goals and should benefit from continued PT to progress toward unmet goals.    Personal Factors and  Comorbidities Comorbidity 3+;Time since onset of injury/illness/exacerbation    Comorbidities RA, DM2, HTN, PAD, DDD,  s/p multiple L LE revascularizations discharged from hospital a 05/05/20.  At that time he was deemed to not be a candidate for further revasc procedures.  Dc'd home with a known bypass occlusion with a plan for AKA if worsening pain. Returned to ED 05/08/20  for increased pain and seeking amputation. s/p L AKA 05/11/20. Pt reports 3 shoulder surgeries on each shoulder and low back surgery    Examination-Activity Limitations Bathing;Locomotion Level;Transfers;Stairs;Stand;Squat;Lift    Examination-Participation Restrictions Church;Meal Prep    Stability/Clinical Decision Making Evolving/Moderate complexity    Rehab Potential Good    PT Frequency 2x / week   plus eval   PT Duration 12 weeks    PT Treatment/Interventions ADLs/Self Care Home Management;DME Instruction;Gait training;Stair training;Functional mobility training;Therapeutic activities;Therapeutic exercise;Balance training;Neuromuscular re-education;Manual techniques;Passive range of motion;Prosthetic Training;Patient/family education;Vestibular    PT Next Visit Plan Continue with sink HEP- was started 2 sessions ago, need to complete program; gait with knee unlocked, Weight shifting over prosthesis.    Consulted and Agree with Plan of Care Patient;Other (Comment)   friend, Timmothy Sours  Patient will benefit from skilled therapeutic intervention in order to improve the following deficits and impairments:  Abnormal gait, Decreased mobility, Decreased strength, Impaired flexibility, Decreased range of motion, Decreased knowledge of use of DME, Prosthetic Dependency, Decreased balance, Decreased activity tolerance  Visit Diagnosis: Muscle weakness (generalized)  Other abnormalities of gait and mobility  Abnormal posture  Unsteadiness on feet     Problem List Patient Active Problem List   Diagnosis Date Noted    Paronychia of great toe 01/28/2021   Onychomycosis 01/28/2021   Critical limb ischemia with history of revascularization of same extremity ([place]) 04/28/2020   Limb ischemia 01/23/2020   Ischemic leg 01/22/2020   History of COVID-19 05/10/2019   PAD (peripheral artery disease) ([place])    History of critical lower limb ischemia 02/19/2019   S/P lumbar fusion, Lower back pain 01/03/2018   Coronary artery disease involving native coronary artery of native heart with unstable angina pectoris [facility])    Preventative health care 03/13/2017   Transaminitis 12/16/2015   Bilateral shoulder pain 08/20/2012   Tobacco use disorder 08/20/2012   HTN (hypertension) 04/20/2011   HLD (hyperlipidemia) 10/08/2009   Rheumatoid arthritis ([place]) 08/11/2009    Willow Ora, PTA, [facility] [address] [place], [place] [address] [phone] 05/11/21, 9:37 PM   Name: [name] [name] MRN: [id number] Date of Birth: 13-Mar-1961

## 2021-05-13 ENCOUNTER — Other Ambulatory Visit: Payer: Self-pay

## 2021-05-13 ENCOUNTER — Ambulatory Visit: Payer: Medicare Other

## 2021-05-13 DIAGNOSIS — R2689 Other abnormalities of gait and mobility: Secondary | ICD-10-CM

## 2021-05-13 DIAGNOSIS — R2681 Unsteadiness on feet: Secondary | ICD-10-CM

## 2021-05-13 DIAGNOSIS — R293 Abnormal posture: Secondary | ICD-10-CM | POA: Diagnosis not present

## 2021-05-13 DIAGNOSIS — M6281 Muscle weakness (generalized): Secondary | ICD-10-CM | POA: Diagnosis not present

## 2021-05-13 NOTE — Therapy (Signed)
[facility] [address] [place], [place], [address] Phone: [phone]   Fax:  [phone]  Physical Therapy Treatment  Patient Details  Name: [name] [name] MRN: [id number] Date of Birth: 03/25/1961 Referring Provider (PT): left AKA   Encounter Date: 05/13/2021   PT End of Session - 05/13/21 1105     Visit Number 4    Number of Visits 25    Date for PT Re-Evaluation 07/22/21    Authorization Type UHC medicare with medicaid secondary so 10th visit progress note    PT Start Time 1102    PT Stop Time 1150    PT Time Calculation (min) 48 min    Equipment Utilized During Treatment Gait belt    Activity Tolerance Patient limited by pain    Behavior During Therapy [facility] for tasks assessed/performed             Past Medical History:  Diagnosis Date   Anemia    Anemia 08/06/2012   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   Hypertension    states is borderline on medication; has been on med. x 5-6 yr.   Hypokalemia 05/30/2018   Impingement syndrome of shoulder region 10/2014   left   Insomnia 06/01/2015   Ischemic ulcer of toe of left foot (HCC)    Left shoulder pain 12/16/2015   Low back pain without sciatica 10/29/2009   Lupus ([place])    Peripheral vascular disease (HCC)    Pneumonia    RA (rheumatoid arthritis) ([place])    Rotator cuff tear 11/04/2014    Past Surgical History:  Procedure Laterality Date   ABDOMINAL AORTAGRAM  02/20/2019   ABDOMINAL AORTOGRAM W/LOWER EXTREMITY N/A 02/20/2019   Procedure: ABDOMINAL AORTOGRAM W/LOWER EXTREMITY;  Surgeon: Marty Heck, MD;  Location: [place] [facility];  Service: Cardiovascular;  Laterality: N/A;   ACHILLES TENDON SURGERY Bilateral    AMPUTATION Left 05/11/2020    Procedure: LEFT ABOVE KNEE AMPUTATION;  Surgeon: Angelia Mould, MD;  Location: [place];  Service: Vascular;  Laterality: Left;   ENDARTERECTOMY POPLITEAL Left 05/01/2020   Procedure: ENDARTERECTOMY POPLITEAL;  Surgeon: Waynetta Sandy, MD;  Location: [place];  Service: Vascular;  Laterality: Left;   FASCIOTOMY Left 05/01/2020   Procedure: FASCIOTOMY;  Surgeon: Waynetta Sandy, MD;  Location: [place];  Service: Vascular;  Laterality: Left;   FEMORAL-POPLITEAL BYPASS GRAFT Left 02/26/2019   Procedure: BYPASS GRAFT FEMORAL-POPLITEAL ARTERY LEFT LEG USING 12mm PROPATEN GRAFT;  Surgeon: Serafina Mitchell, MD;  Location: [place];  Service: Vascular;  Laterality: Left;   INTRAOPERATIVE ARTERIOGRAM Left 01/22/2020   Procedure: INTRA OPERATIVE ARTERIOGRAM with administration of thrombolyics in Left femoral to popliteal bypass.;  Surgeon: Marty Heck, MD;  Location: [facility] [place];  Service: Vascular;  Laterality: Left;   LEFT HEART CATH AND CORONARY ANGIOGRAPHY N/A 03/15/2017   Procedure: LEFT HEART CATH AND CORONARY ANGIOGRAPHY;  Surgeon: Nelva Bush, MD;  Location: [place];  Service: Cardiovascular;  Laterality: N/A;   LOWER EXTREMITY INTERVENTION Left 04/29/2020   Procedure: LOWER EXTREMITY INTERVENTION- LYSIS;  Surgeon: Marty Heck, MD;  Location: [place] [facility];  Service: Cardiovascular;  Laterality: Left;   OSTEOTOMY AND ULNAR SHORTENING Right 07/22/2002  PATCH ANGIOPLASTY Left 05/01/2020   Procedure: [place];  Surgeon: Waynetta Sandy, MD;  Location: [facility];  Service: Vascular;  Laterality: Left;   PERIPHERAL VASCULAR ATHERECTOMY  01/23/2020   Procedure: PERIPHERAL VASCULAR ATHERECTOMY;  Surgeon: Marty Heck, MD;  Location: [place] [facility];  Service: Cardiovascular;;   PERIPHERAL VASCULAR BALLOON ANGIOPLASTY  01/23/2020   Procedure: PERIPHERAL VASCULAR BALLOON ANGIOPLASTY;  Surgeon: Marty Heck, MD;  Location: [place] [facility];  Service: Cardiovascular;;   PERIPHERAL VASCULAR INTERVENTION Left 04/30/2020   Procedure: PERIPHERAL VASCULAR INTERVENTION;  Surgeon: Cherre Robins, MD;  Location: [place] [facility];  Service: Cardiovascular;  Laterality: Left;   PERIPHERAL VASCULAR THROMBECTOMY N/A 01/23/2020   Procedure: lysis recheck;  Surgeon: Marty Heck, MD;  Location: [facility] [facility];  Service: Cardiovascular;  Laterality: N/A;  + [place]    PERIPHERAL VASCULAR THROMBECTOMY N/A 04/30/2020   Procedure: Lysis Recheck;  Surgeon: Cherre Robins, MD;  Location: [place] [facility];  Service: Cardiovascular;  Laterality: N/A;   SHOULDER ARTHROSCOPY WITH BICEPSTENOTOMY Right 03/11/2015   Procedure: SHOULDER ARTHROSCOPY WITH BICEPSTENOTOMY;  Surgeon: Ninetta Lights, MD;  Location: [facility];  Service: Orthopedics;  Laterality: Right;   SHOULDER ARTHROSCOPY WITH DISTAL CLAVICLE RESECTION Left 11/19/2014   Procedure: SHOULDER ARTHROSCOPY WITH DISTAL CLAVICLE RESECTION;  Surgeon: Kathryne Hitch, MD;  Location: [facility];  Service: Orthopedics;  Laterality: Left;   SHOULDER ARTHROSCOPY WITH ROTATOR CUFF REPAIR AND SUBACROMIAL DECOMPRESSION Left 11/19/2014   Procedure: LEFT SHOULDER ARTHROSCOPY, DEBRIDEMENT DISTAL CLAVICLE EXCISION, ACROMIOPLASTY WITH ROTATOR CUFF REPAIR ;  Surgeon: Kathryne Hitch, MD;  Location: [facility];  Service: Orthopedics;  Laterality: Left;   THROMBECTOMY OF BYPASS GRAFT FEMORAL- POPLITEAL ARTERY Left 05/01/2020   Procedure: THROMBECTOMY OF LOWER EXTREMITY;  Surgeon: Waynetta Sandy, MD;  Location: [facility];  Service: Vascular;  Laterality: Left;   TRANSFORAMINAL LUMBAR INTERBODY FUSION (TLIF) WITH PEDICLE SCREW FIXATION 1 LEVEL N/A 01/03/2018   Procedure: TRANSFORAMINAL LUMBAR INTERBODY FUSION (TLIF) LUMBAR FIVE-SACRAL ONE;  Surgeon: Melina Schools, MD;  Location: [facility];  Service: Orthopedics;   Laterality: N/A;   ULTRASOUND GUIDANCE FOR VASCULAR ACCESS Right 01/22/2020   Procedure: ULTRASOUND GUIDANCE FOR VASCULAR ACCESS Right Common Femoral Artery.;  Surgeon: Marty Heck, MD;  Location: [place];  Service: Vascular;  Laterality: Right;   VIDEO ASSISTED THORACOSCOPY (VATS)/DECORTICATION Right 11/21/2011   drainage of empyema    There were no vitals filed for this visit.   Subjective Assessment - 05/13/21 1105     Subjective Pt reports that he had a fall out of bed Wednesday. Woke up on the floor. Stayed there an hour before could wake someone in house to help him up. Pt saw Bobby on Tuesday. Reports that he did a little adjustment to slow it down and help to lock a little sooner.    Patient is accompained by: --   friend, [name] [name]   Pertinent History RA, DM2, HTN, PAD, DDD,  s/p multiple L LE revascularizations discharged from hospital a 05/05/20.  At that time he was deemed to not be a candidate for further revasc procedures.  Dc'd home with a known bypass occlusion with a plan for AKA if worsening pain. Returned to ED 05/08/20  for increased pain and seeking amputation. s/p L AKA 05/11/20    Patient Stated Goals Pt wants to be able to walk better.    Currently in Pain?  Yes    Pain Score 5     Pain Location Hip    Pain Orientation Right    Pain Descriptors / Indicators Aching;Sore    Pain Type Chronic pain    Pain Onset In the past 7 days    Pain Frequency Constant    Aggravating Factors  had a fall on wednesday                [facility] PT Assessment - 05/13/21 1109       ROM / Strength   AROM / PROM / Strength PROM      PROM   PROM Assessment Site Hip    Right/Left Hip Left    Left Hip Extension -20 degrees from neutral supine.   prosthesis currently has ~15 degrees flexion built in                          [facility] Adult PT Treatment/Exercise - 05/13/21 1109       Transfers   Transfers Sit to Stand;Stand to Sit    Sit to Stand 5: Supervision;4:  Min guard;With upper extremity assist    Sit to Stand Details Verbal cues for technique    Sit to Stand Details (indicate cue type and reason) Pt needed assist at times to get prosthesis to lock out due to lack of hip extension.    Stand to Sit 5: Supervision;4: Min guard    Stand to Sit Details (indicate cue type and reason) Verbal cues for technique    Stand to Sit Details Pt was given cues to get left leg slightly posterior to allow him to get weight on toes to flex knee to go to sit. Also performed keeping knee locked out when at counter.      Ambulation/Gait   Ambulation/Gait Yes    Ambulation/Gait Assistance 3: Mod assist    Ambulation/Gait Assistance Details Verbal cues to pull back on left hip to lock knee with heel strike. Pt unable to consistently lock knee needing mod assist from therapist at times with PT blocking left knee for safety. Pt with very flexed posture and unable to shift over left leg. w/c follow. Pt had to stop due to right hip bothering him.    Ambulation Distance (Feet) 15 Feet    Assistive device Rolling walker;Prosthesis    Gait Pattern Step-to pattern;Step-through pattern;Decreased stance time - left;Decreased hip/knee flexion - left;Decreased weight shift to left;Trunk flexed    Ambulation Surface Level;Indoor      Neuro Re-ed    Neuro Re-ed Details  At counter: weight shifting side to side x 10 with tactile cues for form and to stay up tall over prosthesis, weight shifting ant/post x 10 with focus on pushing hip forward for more erect posture. Trialed 40mm lift under prosthetic foot as pt standing with right knee flexed and feeling like prosthesis was shorter. Difficult to accurately assess due to flexed posture and scoliosis. Pt does rotate pelvis posterior on left and increase trunk flexion. Will continue to look at length to determine if needs adjustment in future.      Prosthetics   Prosthetic Care Comments  PT reviewed sweat management with using  antiperspirant. He reports he is going to get some. Pt did have sock donned today. At end of session PT removed socket to measure hip extension. Noted that liner had slid down. He stated he did feel like he was sweating. Pt declined to fix and said  he would remove when got home in 5 min.    Current prosthetic wear tolerance (days/week)  daily    Current prosthetic wear tolerance (#hours/day)  1-2 hours, 2-3 times a day    Residual limb condition  no issues reported    Education Provided Skin check;Proper Donning;Proper wear schedule/adjustment    Person(s) Educated Patient;Other (comment)   friend   Education Method Explanation;Demonstration    Education Method Verbalized understanding    Donning Prosthesis Supervision;Minimal assist                     PT Education - 05/13/21 1302     Education Details Pt was instructed to be sure prosthesis is locked out when walking at home.    Person(s) Educated Patient    Methods Explanation    Comprehension Verbalized understanding              PT Short Term Goals - 04/28/21 1847       PT SHORT TERM GOAL #1   Title Pt will be able to tolerate wearing prosthesis all awake hours for improved function.    Baseline 6 hours/day    Time 4    Period Weeks    Status New    Target Date 05/26/21      PT SHORT TERM GOAL #2   Title Pt will be independent with prosthetic management for improved function.    Baseline currently needs cues    Time 4    Period Weeks    Status New    Target Date 05/26/21      PT SHORT TERM GOAL #3   Title Pt will be able to ambulate 200' with RW initiating prosthetic knee flexion on level surfaces supervision for improved household mobility.    Baseline 35' with RW min assist    Time 4    Period Weeks    Status New    Target Date 05/26/21      PT SHORT TERM GOAL #4   Title Pt will decrease 5 x sit to stand from 29.56 sec to <25 sec for improved balance and functional mobility.    Baseline 04/27/21  29.56 sec from chair with hands    Time 4    Period Weeks    Status New    Target Date 05/26/21      PT SHORT TERM GOAL #5   Title Berg Balance will be assessed and LTG written    Baseline TBD    Time 4    Period Weeks    Status New    Target Date 05/26/21               PT Long Term Goals - 04/28/21 1853       PT LONG TERM GOAL #1   Title Pt will be independent with HEP for strengthening, ROM and balance to continue gains on own.    Baseline no current HEP    Time 12    Period Weeks    Status New    Target Date 07/22/21      PT LONG TERM GOAL #2   Title Pt will increase gait speed to >0.69m/s for improved short community distances.    Baseline 04/27/21 0.35m/s    Time 12    Period Weeks    Status New    Target Date 07/22/21      PT LONG TERM GOAL #3   Title Pt will ambulate >400' with walker on varied  level surfaces mod I for short community distances.    Baseline 04/27/21 75' min assit with RW    Time 12    Period Weeks    Status New    Target Date 07/22/21      PT LONG TERM GOAL #4   Title Pt will ambulate up/down 4 steps with rails mod I for improved community access.    Baseline unable    Time 12    Period Weeks    Status New    Target Date 07/22/21      PT LONG TERM GOAL #5   Title Berg TBD    Baseline TBD    Time 12    Period Weeks    Status New    Target Date 07/22/21                   Plan - 05/13/21 1304     Clinical Impression Statement Pt was unable to lock left knee with gait making high fall risk. He is lacking 20 degrees hip extension from neutral. Prosthesis has ~15 degree flexion built in. Upright standing appears to be less than this. Pt was limited due to right hip soreness from fall the other day out of bed. PT has instructed pt to be sure to keep prosthesis locked out when walking at home.    Personal Factors and Comorbidities Comorbidity 3+;Time since onset of injury/illness/exacerbation    Comorbidities RA, DM2, HTN,  PAD, DDD,  s/p multiple L LE revascularizations discharged from hospital a 05/05/20.  At that time he was deemed to not be a candidate for further revasc procedures.  Dc'd home with a known bypass occlusion with a plan for AKA if worsening pain. Returned to ED 05/08/20  for increased pain and seeking amputation. s/p L AKA 05/11/20. Pt reports 3 shoulder surgeries on each shoulder and low back surgery    Examination-Activity Limitations Bathing;Locomotion Level;Transfers;Stairs;Stand;Squat;Lift    Examination-Participation Restrictions Church;Meal Prep    Stability/Clinical Decision Making Evolving/Moderate complexity    Rehab Potential Good    PT Frequency 2x / week   plus eval   PT Duration 12 weeks    PT Treatment/Interventions ADLs/Self Care Home Management;DME Instruction;Gait training;Stair training;Functional mobility training;Therapeutic activities;Therapeutic exercise;Balance training;Neuromuscular re-education;Manual techniques;Passive range of motion;Prosthetic Training;Patient/family education;Vestibular    PT Next Visit Plan How is right hip feeling? Continue with sink HEP- was started 2 sessions ago, need to complete program; Try standing with posterior pelvis against counter and reaching behind to counter working on upright posture, modified Thomas test stretch, right hamstring stretch, right TKE in standing;  GAIT WITH PROSTHESIS LOCKED OUT RIGHT NOW as unable to lock on own, work on weight shifting over prosthesis. Berg with prosthesis locked out and walker as needed. Prosthetic education.    Consulted and Agree with Plan of Care Patient;Other (Comment)   friend, [name] [name]            Patient will benefit from skilled therapeutic intervention in order to improve the following deficits and impairments:  Abnormal gait, Decreased mobility, Decreased strength, Impaired flexibility, Decreased range of motion, Decreased knowledge of use of DME, Prosthetic Dependency, Decreased balance, Decreased  activity tolerance  Visit Diagnosis: Other abnormalities of gait and mobility  Muscle weakness (generalized)  Unsteadiness on feet  Abnormal posture     Problem List Patient Active Problem List   Diagnosis Date Noted   Paronychia of great toe 01/28/2021   Onychomycosis 01/28/2021   Critical limb ischemia with history  of revascularization of same extremity ([place]) 04/28/2020   Limb ischemia 01/23/2020   Ischemic leg 01/22/2020   History of COVID-19 05/10/2019   PAD (peripheral artery disease) ([place])    History of critical lower limb ischemia 02/19/2019   S/P lumbar fusion, Lower back pain 01/03/2018   Coronary artery disease involving native coronary artery of native heart with unstable angina pectoris [facility])    Preventative health care 03/13/2017   Transaminitis 12/16/2015   Bilateral shoulder pain 08/20/2012   Tobacco use disorder 08/20/2012   HTN (hypertension) 04/20/2011   HLD (hyperlipidemia) 10/08/2009   Rheumatoid arthritis ([place]) 08/11/2009    [name] [name], PT, DPT, NCS 05/13/2021, 1:12 PM  [facility] [address] [place], [place], [address] Phone: [phone]   Fax:  [phone]  Name: [name] [name] MRN: [id number] Date of Birth: 04-Dec-1960

## 2021-05-19 ENCOUNTER — Other Ambulatory Visit: Payer: Self-pay | Admitting: Internal Medicine

## 2021-05-19 DIAGNOSIS — I10 Essential (primary) hypertension: Secondary | ICD-10-CM

## 2021-05-24 ENCOUNTER — Ambulatory Visit: Payer: Medicare Other | Admitting: Physical Therapy

## 2021-05-27 ENCOUNTER — Other Ambulatory Visit: Payer: Self-pay

## 2021-05-27 ENCOUNTER — Ambulatory Visit: Payer: Medicare Other | Attending: Vascular Surgery

## 2021-05-27 DIAGNOSIS — R2689 Other abnormalities of gait and mobility: Secondary | ICD-10-CM | POA: Diagnosis not present

## 2021-05-27 DIAGNOSIS — R2681 Unsteadiness on feet: Secondary | ICD-10-CM | POA: Insufficient documentation

## 2021-05-27 DIAGNOSIS — M6281 Muscle weakness (generalized): Secondary | ICD-10-CM | POA: Diagnosis not present

## 2021-05-27 DIAGNOSIS — R293 Abnormal posture: Secondary | ICD-10-CM | POA: Diagnosis not present

## 2021-05-28 NOTE — Therapy (Signed)
[facility] [address] [place], [place], [address] Phone: [phone]   Fax:  [phone]  Physical Therapy Treatment  Patient Details  Name: [name] [name] MRN: [id number] Date of Birth: [birth year] Referring Provider (PT): left AKA   Encounter Date: 05/27/2021   PT End of Session - 05/27/21 1104     Visit Number 5    Number of Visits 25    Date for PT Re-Evaluation 07/22/21    Authorization Type UHC medicare with medicaid secondary so 10th visit progress note    PT Start Time 1103    PT Stop Time 1145    PT Time Calculation (min) 42 min    Equipment Utilized During Treatment Gait belt    Activity Tolerance Patient limited by pain    Behavior During Therapy [facility] for tasks assessed/performed             Past Medical History:  Diagnosis Date   Anemia    Anemia 08/06/2012   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   Hypertension    states is borderline on medication; has been on med. x 5-6 yr.   Hypokalemia 05/30/2018   Impingement syndrome of shoulder region 10/2014   left   Insomnia 06/01/2015   Ischemic ulcer of toe of left foot (HCC)    Left shoulder pain 12/16/2015   Low back pain without sciatica 10/29/2009   Lupus ([place])    Peripheral vascular disease (HCC)    Pneumonia    RA (rheumatoid arthritis) ([place])    Rotator cuff tear 11/04/2014    Past Surgical History:  Procedure Laterality Date   ABDOMINAL AORTAGRAM  02/20/2019   ABDOMINAL AORTOGRAM W/LOWER EXTREMITY N/A 02/20/2019   Procedure: ABDOMINAL AORTOGRAM W/LOWER EXTREMITY;  Surgeon: Marty Heck, MD;  Location: [facility];  Service: Cardiovascular;  Laterality: N/A;   ACHILLES TENDON SURGERY Bilateral    AMPUTATION Left 05/11/2020    Procedure: LEFT ABOVE KNEE AMPUTATION;  Surgeon: Angelia Mould, MD;  Location: [facility];  Service: Vascular;  Laterality: Left;   ENDARTERECTOMY POPLITEAL Left 05/01/2020   Procedure: ENDARTERECTOMY POPLITEAL;  Surgeon: Waynetta Sandy, MD;  Location: [facility];  Service: Vascular;  Laterality: Left;   FASCIOTOMY Left 05/01/2020   Procedure: FASCIOTOMY;  Surgeon: Waynetta Sandy, MD;  Location: [facility];  Service: Vascular;  Laterality: Left;   FEMORAL-POPLITEAL BYPASS GRAFT Left 02/26/2019   Procedure: BYPASS GRAFT FEMORAL-POPLITEAL ARTERY LEFT LEG USING 75m PROPATEN GRAFT;  Surgeon: BSerafina Mitchell MD;  [place]  Service: Vascular;  Laterality: Left;   INTRAOPERATIVE ARTERIOGRAM Left 01/22/2020   Procedure: INTRA OPERATIVE ARTERIOGRAM with administration of thrombolyics in Left femoral to popliteal bypass.;  Surgeon: CMarty Heck MD;  [place] [facility]  Service: Vascular;  Laterality: Left;   LEFT HEART CATH AND CORONARY ANGIOGRAPHY N/A 03/15/2017   Procedure: LEFT HEART CATH AND CORONARY ANGIOGRAPHY;  Surgeon: ENelva Bush MD;  [facility];  Service: Cardiovascular;  Laterality: N/A;   LOWER EXTREMITY INTERVENTION Left 04/29/2020   Procedure: LOWER EXTREMITY INTERVENTION- LYSIS;  Surgeon: CMarty Heck MD;  [facility];  Service: Cardiovascular;  Laterality: Left;   OSTEOTOMY AND ULNAR SHORTENING Right 07/22/2002  PATCH ANGIOPLASTY Left 05/01/2020   Procedure: [place];  Surgeon: Waynetta Sandy, MD;  Location: [facility];  Service: Vascular;  Laterality: Left;   PERIPHERAL VASCULAR ATHERECTOMY  01/23/2020   Procedure: PERIPHERAL VASCULAR ATHERECTOMY;  Surgeon: Marty Heck, MD;  Location: [facility];  Service: Cardiovascular;;   PERIPHERAL VASCULAR BALLOON ANGIOPLASTY  01/23/2020   Procedure: PERIPHERAL VASCULAR BALLOON ANGIOPLASTY;  Surgeon: Marty Heck, MD;  Location: [facility];  Service: Cardiovascular;;   PERIPHERAL VASCULAR INTERVENTION Left 04/30/2020   Procedure: PERIPHERAL VASCULAR INTERVENTION;  Surgeon: Cherre Robins, MD;  Location: [facility];  Service: Cardiovascular;  Laterality: Left;   PERIPHERAL VASCULAR THROMBECTOMY N/A 01/23/2020   Procedure: lysis recheck;  Surgeon: Marty Heck, MD;  Location: [facility];  Service: Cardiovascular;  Laterality: N/A;  + [place]    PERIPHERAL VASCULAR THROMBECTOMY N/A 04/30/2020   Procedure: Lysis Recheck;  Surgeon: Cherre Robins, MD;  Location: [facility];  Service: Cardiovascular;  Laterality: N/A;   SHOULDER ARTHROSCOPY WITH BICEPSTENOTOMY Right 03/11/2015   Procedure: SHOULDER ARTHROSCOPY WITH BICEPSTENOTOMY;  Surgeon: Ninetta Lights, MD;  Location: [facility];  Service: Orthopedics;  Laterality: Right;   SHOULDER ARTHROSCOPY WITH DISTAL CLAVICLE RESECTION Left 11/19/2014   Procedure: SHOULDER ARTHROSCOPY WITH DISTAL CLAVICLE RESECTION;  Surgeon: Kathryne Hitch, MD;  Location: [facility];  Service: Orthopedics;  Laterality: Left;   SHOULDER ARTHROSCOPY WITH ROTATOR CUFF REPAIR AND SUBACROMIAL DECOMPRESSION Left 11/19/2014   Procedure: LEFT SHOULDER ARTHROSCOPY, DEBRIDEMENT DISTAL CLAVICLE EXCISION, ACROMIOPLASTY WITH ROTATOR CUFF REPAIR ;  Surgeon: Kathryne Hitch, MD;  Location: [facility];  Service: Orthopedics;  Laterality: Left;   THROMBECTOMY OF BYPASS GRAFT FEMORAL- POPLITEAL ARTERY Left 05/01/2020   Procedure: THROMBECTOMY OF LOWER EXTREMITY;  Surgeon: Waynetta Sandy, MD;  Location: [facility];  Service: Vascular;  Laterality: Left;   TRANSFORAMINAL LUMBAR INTERBODY FUSION (TLIF) WITH PEDICLE SCREW FIXATION 1 LEVEL N/A 01/03/2018   Procedure: TRANSFORAMINAL LUMBAR INTERBODY FUSION (TLIF) LUMBAR FIVE-SACRAL ONE;  Surgeon: Melina Schools, MD;  Location: [facility];  Service: Orthopedics;   Laterality: N/A;   ULTRASOUND GUIDANCE FOR VASCULAR ACCESS Right 01/22/2020   Procedure: ULTRASOUND GUIDANCE FOR VASCULAR ACCESS Right Common Femoral Artery.;  Surgeon: Marty Heck, MD;  Location: [facility];  Service: Vascular;  Laterality: Right;   VIDEO ASSISTED THORACOSCOPY (VATS)/DECORTICATION Right 11/21/2011   drainage of empyema    There were no vitals filed for this visit.   Subjective Assessment - 05/27/21 1104     Subjective Pt denies any changes. Has been walking some at home with knee locked out.    Patient is accompained by: --   friend, Don   Pertinent History RA, DM2, HTN, PAD, DDD,  s/p multiple L LE revascularizations discharged from hospital a 05/05/20.  At that time he was deemed to not be a candidate for further revasc procedures.  Dc'd home with a known bypass occlusion with a plan for AKA if worsening pain. Returned to ED 05/08/20  for increased pain and seeking amputation. s/p L AKA 05/11/20    Patient Stated Goals Pt wants to be able to walk better.    Currently in Pain? Yes    Pain Score 3     Pain Location Hip    Pain Orientation Right;Left    Pain Descriptors / Indicators Aching    Pain Type Chronic pain    Pain  Onset More than a month ago    Pain Frequency Intermittent                               [facility] Adult PT Treatment/Exercise - 05/27/21 1108       Transfers   Transfers Sit to Stand;Stand to Sit    Sit to Stand 5: Supervision    Sit to Stand Details Verbal cues for technique    Stand to Sit 5: Supervision    Stand to Sit Details (indicate cue type and reason) Verbal cues for technique    Stand to Sit Details Pt was cued to kick left leg out prior to sitting as prosthesis locked out.      Ambulation/Gait   Ambulation/Gait Yes    Ambulation/Gait Assistance 4: Min guard;4: Min assist    Ambulation/Gait Assistance Details Walking with prosthesis locked out. Overground after work in // bars to try to improve left weight shift  and posture. Verbal cues to keep head and chest up and not take too large of a right step.    Ambulation Distance (Feet) 48 Feet    Assistive device Rolling walker;Prosthesis    Gait Pattern Step-through pattern;Decreased stance time - left;Decreased weight shift to left;Trunk flexed    Ambulation Surface Level;Indoor    Gait Comments In // bars working on upright posture with visual, verbal and tactile cues. Pt was cued to not take as big a step past prosthesis with right foot. Cued to shift over prosthesis and keep chest and head up. 10' x 6 CGA.      Exercises   Exercises Other Exercises    Other Exercises  at counter: Standing upright facing counter working on equalizing weight bearing and pushing pelvis forward for more upright posture, added in weight shift side to side with tactile cues to tighten gluts and keep trunk upright x 10. Standing at walker with back against counter then reaching hands behind him to counter to work on upright posture and stretching anterior hip 30 sec x 2. Discussed added both exercises for home to work on improving upright posture.      Prosthetics   Prosthetic Care Comments  At end of session PT assisted pt to redonn prosthesis as liner had slid down due to sweat causing leg to internally rotate. Patted skin and liner dry before reapplying. Discussed sweat management with antiperspirant at night (like secret clinical strength) or sweat block wipes. No to put on just prior to liner though. Will need to remove liner to dry when sweating and dry as can cause skin issues.    Current prosthetic wear tolerance (days/week)  daily    Current prosthetic wear tolerance (#hours/day)  1-2 hours, 2-3 times a day    Residual limb condition  denies any skin issues    Education Provided Skin check;Residual limb care;Proper Donning;Proper Doffing    Person(s) Educated Patient    Education Method Explanation    Education Method Verbalized understanding    Donning Prosthesis  Minimal assist    Doffing Prosthesis Supervision                     PT Education - 05/28/21 1445     Education Details Discussed working on standing upright at counter working on posture and weight shift. Also to try standing with back to counter and reaching behind to stretch anterior hip.    Person(s) Educated Patient  Methods Explanation;Demonstration    Comprehension Verbalized understanding;Returned demonstration              PT Short Term Goals - 05/28/21 1435       PT SHORT TERM GOAL #1   Title Pt will be able to tolerate wearing prosthesis all awake hours for improved function.    Baseline 6 hours/day- has not increased from here yet    Time 4    Period Weeks    Status Not Met    Target Date 05/26/21      PT SHORT TERM GOAL #2   Title Pt will be independent with prosthetic management for improved function.    Baseline currently needs cues    Time 4    Period Weeks    Status Not Met    Target Date 05/26/21      PT SHORT TERM GOAL #3   Title Pt will be able to ambulate 200' with RW initiating prosthetic knee flexion on level surfaces supervision for improved household mobility.    Baseline 48' min assist/CGA with RW and prosthesis locked out    Time 4    Period Weeks    Status Not Met    Target Date 05/26/21      PT SHORT TERM GOAL #4   Title Pt will decrease 5 x sit to stand from 29.56 sec to <25 sec for improved balance and functional mobility.    Baseline 04/27/21 29.56 sec from chair with hands    Time 4    Period Weeks    Status New    Target Date 05/26/21      PT SHORT TERM GOAL #5   Title Berg Balance will be assessed and LTG written    Baseline TBD    Time 4    Period Weeks    Status New    Target Date 05/26/21               PT Long Term Goals - 04/28/21 1853       PT LONG TERM GOAL #1   Title Pt will be independent with HEP for strengthening, ROM and balance to continue gains on own.    Baseline no current HEP     Time 12    Period Weeks    Status New    Target Date 07/22/21      PT LONG TERM GOAL #2   Title Pt will increase gait speed to >0.5ms for improved short community distances.    Baseline 04/27/21 0.115m    Time 12    Period Weeks    Status New    Target Date 07/22/21      PT LONG TERM GOAL #3   Title Pt will ambulate >400' with walker on varied level surfaces mod I for short community distances.    Baseline 04/27/21 75' min assit with RW    Time 12    Period Weeks    Status New    Target Date 07/22/21      PT LONG TERM GOAL #4   Title Pt will ambulate up/down 4 steps with rails mod I for improved community access.    Baseline unable    Time 12    Period Weeks    Status New    Target Date 07/22/21      PT LONG TERM GOAL #5   Title Berg TBD    Baseline TBD    Time 12    Period Weeks  Status New    Target Date 07/22/21                   Plan - 05/28/21 1438     Clinical Impression Statement PT switched to working on gait with prosthesis locked out at this time. Focused on improving posture and stretching out hip flexors. Pt able to show much improvement after practice with better weight shift over prosthesis. Much safer with keeping knee locked out at this time. Started checking STGs. Pt still needing prosthetic education especially with how to deal with sweat management. He has not increased wear time past 6 hours/day at this time. Pt has not met gait goal yet but is showing improved technique. Continues to benefit from skilled PT to continue to progress towards goals.    Personal Factors and Comorbidities Comorbidity 3+;Time since onset of injury/illness/exacerbation    Comorbidities RA, DM2, HTN, PAD, DDD,  s/p multiple L LE revascularizations discharged from hospital a 05/05/20.  At that time he was deemed to not be a candidate for further revasc procedures.  Dc'd home with a known bypass occlusion with a plan for AKA if worsening pain. Returned to ED 05/08/20   for increased pain and seeking amputation. s/p L AKA 05/11/20. Pt reports 3 shoulder surgeries on each shoulder and low back surgery    Examination-Activity Limitations Bathing;Locomotion Level;Transfers;Stairs;Stand;Squat;Lift    Examination-Participation Restrictions Church;Meal Prep    Stability/Clinical Decision Making Evolving/Moderate complexity    Rehab Potential Good    PT Frequency 2x / week   plus eval   PT Duration 12 weeks    PT Treatment/Interventions ADLs/Self Care Home Management;DME Instruction;Gait training;Stair training;Functional mobility training;Therapeutic activities;Therapeutic exercise;Balance training;Neuromuscular re-education;Manual techniques;Passive range of motion;Prosthetic Training;Patient/family education;Vestibular    PT Next Visit Plan Audra- Check remaining 2 STGs and update the STGs to 8 weeks. Berg with prosthesis locked out and walker as needed most likely at this time to get a modified scoring. Continue standing with posterior pelvis against counter and reaching behind to counter working on upright posture, modified Thomas test stretch, try right hamstring stretch, right TKE in standing;  GAIT WITH PROSTHESIS LOCKED OUT RIGHT NOW as unable to lock on own, work on weight shifting over prosthesis.  Prosthetic education.    Consulted and Agree with Plan of Care Patient;Other (Comment)   friend, Timmothy Sours            Patient will benefit from skilled therapeutic intervention in order to improve the following deficits and impairments:  Abnormal gait, Decreased mobility, Decreased strength, Impaired flexibility, Decreased range of motion, Decreased knowledge of use of DME, Prosthetic Dependency, Decreased balance, Decreased activity tolerance  Visit Diagnosis: Other abnormalities of gait and mobility  Abnormal posture  Muscle weakness (generalized)  Unsteadiness on feet     Problem List Patient Active Problem List   Diagnosis Date Noted   Paronychia of  great toe 01/28/2021   Onychomycosis 01/28/2021   Critical limb ischemia with history of revascularization of same extremity ([place]) 04/28/2020   Limb ischemia 01/23/2020   Ischemic leg 01/22/2020   History of COVID-19 05/10/2019   PAD (peripheral artery disease) ([place])    History of critical lower limb ischemia 02/19/2019   S/P lumbar fusion, Lower back pain 01/03/2018   Coronary artery disease involving native coronary artery of native heart with unstable angina pectoris [facility])    Preventative health care 03/13/2017   Transaminitis 12/16/2015   Bilateral shoulder pain 08/20/2012   Tobacco use disorder 08/20/2012  HTN (hypertension) 04/20/2011   HLD (hyperlipidemia) 10/08/2009   Rheumatoid arthritis ([place]) 08/11/2009    Electa Sniff, PT, DPT, NCS 05/28/2021, 2:47 PM  [facility] [address] [place], [place], [address] Phone: [phone]   Fax:  [phone]  Name: [name] [name] MRN: [id number] Date of Birth: 1961-04-26

## 2021-05-31 ENCOUNTER — Other Ambulatory Visit: Payer: Self-pay

## 2021-05-31 ENCOUNTER — Ambulatory Visit: Payer: Medicare Other | Admitting: Physical Therapy

## 2021-05-31 DIAGNOSIS — R2689 Other abnormalities of gait and mobility: Secondary | ICD-10-CM

## 2021-05-31 DIAGNOSIS — R2681 Unsteadiness on feet: Secondary | ICD-10-CM

## 2021-05-31 DIAGNOSIS — M6281 Muscle weakness (generalized): Secondary | ICD-10-CM

## 2021-05-31 DIAGNOSIS — R293 Abnormal posture: Secondary | ICD-10-CM

## 2021-05-31 NOTE — Patient Instructions (Signed)
Access Code: [email or web address] URL: [email or web address] Date: 05/31/2021 Prepared by: Misty Stanley  Exercises Prone Hip Flexor Stretch with Towel Roll (AKA) - 2-3 x daily - 7 x weekly - 1 sets - 3 reps - 2 mins hold Supine Hip Flexor Stretch with Weight - 2-3 x daily - 7 x weekly - 1 sets - 3 reps - 1 min hold Sidelying ITB Stretch off Table (Mirrored) - 1 x daily - 7 x weekly - 2 sets - 60 seconds hold

## 2021-06-01 NOTE — Therapy (Signed)
[facility] [address] [place], [place], [address] Phone: [phone]   Fax:  [phone]  Physical Therapy Treatment  Patient Details  Name: [name] [name] MRN: [id number] Date of Birth: [birth year] Referring Provider (PT): left AKA   Encounter Date: 05/31/2021   PT End of Session - 06/01/21 1051     Visit Number 6    Number of Visits 25    Date for PT Re-Evaluation 07/22/21    Authorization Type UHC medicare with medicaid secondary so 10th visit progress note    PT Start Time 1454    PT Stop Time 1533    PT Time Calculation (min) 39 min    Equipment Utilized During Treatment --    Activity Tolerance Patient limited by pain    Behavior During Therapy [facility] for tasks assessed/performed             Past Medical History:  Diagnosis Date   Anemia    Anemia 08/06/2012   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   Hypertension    states is borderline on medication; has been on med. x 5-6 yr.   Hypokalemia 05/30/2018   Impingement syndrome of shoulder region 10/2014   left   Insomnia 06/01/2015   Ischemic ulcer of toe of left foot (HCC)    Left shoulder pain 12/16/2015   Low back pain without sciatica 10/29/2009   Lupus ([place])    Peripheral vascular disease (HCC)    Pneumonia    RA (rheumatoid arthritis) ([place])    Rotator cuff tear 11/04/2014    Past Surgical History:  Procedure Laterality Date   ABDOMINAL AORTAGRAM  02/20/2019   ABDOMINAL AORTOGRAM W/LOWER EXTREMITY N/A 02/20/2019   Procedure: ABDOMINAL AORTOGRAM W/LOWER EXTREMITY;  Surgeon: Marty Heck, MD;  Location: [facility];  Service: Cardiovascular;  Laterality: N/A;   ACHILLES TENDON SURGERY Bilateral    AMPUTATION Left 05/11/2020   Procedure:  LEFT ABOVE KNEE AMPUTATION;  Surgeon: Angelia Mould, MD;  Location: [facility];  Service: Vascular;  Laterality: Left;   ENDARTERECTOMY POPLITEAL Left 05/01/2020   Procedure: ENDARTERECTOMY POPLITEAL;  Surgeon: Waynetta Sandy, MD;  Location: [facility];  Service: Vascular;  Laterality: Left;   FASCIOTOMY Left 05/01/2020   Procedure: FASCIOTOMY;  Surgeon: Waynetta Sandy, MD;  Location: [facility];  Service: Vascular;  Laterality: Left;   FEMORAL-POPLITEAL BYPASS GRAFT Left 02/26/2019   Procedure: BYPASS GRAFT FEMORAL-POPLITEAL ARTERY LEFT LEG USING 14m PROPATEN GRAFT;  Surgeon: BSerafina Mitchell MD;  [facility]  Service: Vascular;  Laterality: Left;   INTRAOPERATIVE ARTERIOGRAM Left 01/22/2020   Procedure: INTRA OPERATIVE ARTERIOGRAM with administration of thrombolyics in Left femoral to popliteal bypass.;  Surgeon: CMarty Heck MD;  [facility]  Service: Vascular;  Laterality: Left;   LEFT HEART CATH AND CORONARY ANGIOGRAPHY N/A 03/15/2017   Procedure: LEFT HEART CATH AND CORONARY ANGIOGRAPHY;  Surgeon: ENelva Bush MD;  [facility];  Service: Cardiovascular;  Laterality: N/A;   LOWER EXTREMITY INTERVENTION Left 04/29/2020   Procedure: LOWER EXTREMITY INTERVENTION- LYSIS;  Surgeon: CMarty Heck MD;  [facility];  Service: Cardiovascular;  Laterality: Left;   OSTEOTOMY AND ULNAR SHORTENING Right 07/22/2002  PATCH ANGIOPLASTY Left 05/01/2020   Procedure: [place];  Surgeon: Waynetta Sandy, MD;  Location: [facility];  Service: Vascular;  Laterality: Left;   PERIPHERAL VASCULAR ATHERECTOMY  01/23/2020   Procedure: PERIPHERAL VASCULAR ATHERECTOMY;  Surgeon: Marty Heck, MD;  Location: [facility];  Service: Cardiovascular;;   PERIPHERAL VASCULAR BALLOON ANGIOPLASTY  01/23/2020   Procedure: PERIPHERAL VASCULAR BALLOON ANGIOPLASTY;  Surgeon: Marty Heck, MD;  Location: [facility];  Service: Cardiovascular;;   PERIPHERAL VASCULAR INTERVENTION Left 04/30/2020   Procedure: PERIPHERAL VASCULAR INTERVENTION;  Surgeon: Cherre Robins, MD;  Location: [facility];  Service: Cardiovascular;  Laterality: Left;   PERIPHERAL VASCULAR THROMBECTOMY N/A 01/23/2020   Procedure: lysis recheck;  Surgeon: Marty Heck, MD;  Location: [facility];  Service: Cardiovascular;  Laterality: N/A;  + [place]    PERIPHERAL VASCULAR THROMBECTOMY N/A 04/30/2020   Procedure: Lysis Recheck;  Surgeon: Cherre Robins, MD;  Location: [facility];  Service: Cardiovascular;  Laterality: N/A;   SHOULDER ARTHROSCOPY WITH BICEPSTENOTOMY Right 03/11/2015   Procedure: SHOULDER ARTHROSCOPY WITH BICEPSTENOTOMY;  Surgeon: Ninetta Lights, MD;  Location: [facility];  Service: Orthopedics;  Laterality: Right;   SHOULDER ARTHROSCOPY WITH DISTAL CLAVICLE RESECTION Left 11/19/2014   Procedure: SHOULDER ARTHROSCOPY WITH DISTAL CLAVICLE RESECTION;  Surgeon: Kathryne Hitch, MD;  Location: [facility];  Service: Orthopedics;  Laterality: Left;   SHOULDER ARTHROSCOPY WITH ROTATOR CUFF REPAIR AND SUBACROMIAL DECOMPRESSION Left 11/19/2014   Procedure: LEFT SHOULDER ARTHROSCOPY, DEBRIDEMENT DISTAL CLAVICLE EXCISION, ACROMIOPLASTY WITH ROTATOR CUFF REPAIR ;  Surgeon: Kathryne Hitch, MD;  Location: [facility];  Service: Orthopedics;  Laterality: Left;   THROMBECTOMY OF BYPASS GRAFT FEMORAL- POPLITEAL ARTERY Left 05/01/2020   Procedure: THROMBECTOMY OF LOWER EXTREMITY;  Surgeon: Waynetta Sandy, MD;  Location: [facility];  Service: Vascular;  Laterality: Left;   TRANSFORAMINAL LUMBAR INTERBODY FUSION (TLIF) WITH PEDICLE SCREW FIXATION 1 LEVEL N/A 01/03/2018   Procedure: TRANSFORAMINAL LUMBAR INTERBODY FUSION (TLIF) LUMBAR FIVE-SACRAL ONE;  Surgeon: Melina Schools, MD;  Location: [facility];  Service: Orthopedics;  Laterality: N/A;    ULTRASOUND GUIDANCE FOR VASCULAR ACCESS Right 01/22/2020   Procedure: ULTRASOUND GUIDANCE FOR VASCULAR ACCESS Right Common Femoral Artery.;  Surgeon: Marty Heck, MD;  Location: [facility];  Service: Vascular;  Laterality: Right;   VIDEO ASSISTED THORACOSCOPY (VATS)/DECORTICATION Right 11/21/2011   drainage of empyema    There were no vitals filed for this visit.   Subjective Assessment - 05/31/21 1458     Subjective Usually does exercises at 4am and after performing exercises phantom pain in R "Great toe" returned; kept him up all night.  Pain resolved spontaneously; better today. Still walking around the house with knee locked.  No falls.    Patient is accompained by: --   friend, Don   Pertinent History RA, DM2, HTN, PAD, DDD,  s/p multiple L LE revascularizations discharged from hospital a 05/05/20.  At that time he was deemed to not be a candidate for further revasc procedures.  Dc'd home with a known bypass occlusion with a plan for AKA if worsening pain. Returned to ED 05/08/20  for increased pain and seeking amputation. s/p L AKA 05/11/20    Patient Stated Goals Pt wants to be able to walk better.    Currently in Pain? No/denies    Pain Onset More than a month ago  [facility] PT Assessment - 05/31/21 1508       Transfers   Five time sit to stand comments  22.78 seconds      Standardized Balance Assessment   Standardized Balance Assessment Five Times Sit to Stand;Berg Balance Test    Five times sit to stand comments  22.78 from mat, knee unlocked, UE support on RW; supervision from PT      Berg Balance Test   Sit to Stand Able to stand  independently using hands    Berg comment: Started to assess BERG but due to significant RLE flexion, stopped to further assess leg length             Prosthetics Assessment - 05/31/21 1501       Prosthetics   Donning prosthesis  Modified independent (Device/Increase time)    Doffing prosthesis  Modified independent  (Device/Increase time)    Current prosthetic wear tolerance (days/week)  daily    Current prosthetic wear tolerance (#hours/day)  3-5 hours a day    Residual limb condition  reports a small "hair bump" on residual limb; removed prosthesis and PT examined skin.  Small bump noted on inner thigh, not on incision, slightly red around it but no drainage noted.  Scab in place.    Prosthesis Description velcro suspension system with dual stance and swing control.                          [facility] Adult PT Treatment/Exercise - 05/31/21 1508       Transfers   Transfers Sit to Stand;Stand to Sit;Squat Pivot Transfers;Supine to Sit;Sit to Supine    Sit to Stand 5: Supervision    Sit to Stand Details (indicate cue type and reason) with knee unlocked, locked once standing    Stand to Sit 5: Supervision    Stand to Sit Details increased time to unlock knee    Squat Pivot Transfers 5: Supervision    Squat Pivot Transfer Details (indicate cue type and reason) mat <> w/c with knee unlocked    Supine to Sit 5: Supervision    Sit to Supine 5: Supervision      Exercises   Exercises Other Exercises    Other Exercises  Reviewed R sidelying L side and IT band stretch with pt wearing prosthesis to act as extra weight and assist with stretch      Prosthetics   Prosthetic Care Comments  With L knee locked pt continues to present with significant R hip and knee flexion in standing; if pt extends R hip and knee, L prosthesis floats off the ground.  Assessed leg length discrepancy by placing various sizes of shoe lifts under LLE until pt demonstrated improved pelvic alignment and ability to extend R hip and knee while maintaining L foot on floor.  Required 24m and 723mstacked together (2274mr .86 inches).  Will discuss with primary PT and prosthetist if pt would benefit from prosthetic lengthening to improve standing balance, standing tolerance and safety with gait.    Current prosthetic wear tolerance  (days/week)  daily    Current prosthetic wear tolerance (#hours/day)  3-5 hours a day    Residual limb condition  reports a small "hair bump" on residual limb; removed prosthesis and PT examined skin.  Small bump noted on inner thigh, not on incision, slightly red around it but no drainage noted.  Scab in place.    Education Provided Skin check  Person(s) Educated Patient                     PT Education - 06/01/21 1041     Education Details Possible need for further adjustment to prosthesis; will discuss with prosthetist and primary PT; L side and IT band stretch    Person(s) Educated Patient    Methods Explanation    Comprehension Verbalized understanding              PT Short Term Goals - 06/01/21 1057       PT SHORT TERM GOAL #1   Title Pt will be able to tolerate wearing prosthesis all awake hours for improved function.    Baseline 6 hours/day- has not increased from here yet    Time 4    Period Weeks    Status Not Met    Target Date 07/01/21      PT SHORT TERM GOAL #2   Title Pt will be independent with prosthetic management for improved function.    Baseline currently needs cues    Time 4    Period Weeks    Status Not Met    Target Date 07/01/21      PT SHORT TERM GOAL #3   Title Pt will be able to ambulate 200' with RW initiating prosthetic knee flexion on level surfaces supervision for improved household mobility.    Baseline 48' min assist/CGA with RW and prosthesis locked out    Time 4    Period Weeks    Status Not Met    Target Date 07/01/21      PT SHORT TERM GOAL #4   Title Pt will decrease 5 x sit to stand to </= 18 sec for improved balance and functional mobility.    Baseline 04/27/21 29.56 sec from chair with hands > 22 seconds from mat with hands and RW    Time 4    Period Weeks    Status Revised    Target Date 07/01/21      PT SHORT TERM GOAL #5   Title Berg Balance will be assessed and LTG written    Baseline not assessed; may  require further adjustment of prosthesis to improve leg length    Time 4    Period Weeks    Status On-going    Target Date 07/01/21               PT Long Term Goals - 04/28/21 1853       PT LONG TERM GOAL #1   Title Pt will be independent with HEP for strengthening, ROM and balance to continue gains on own.    Baseline no current HEP    Time 12    Period Weeks    Status New    Target Date 07/22/21      PT LONG TERM GOAL #2   Title Pt will increase gait speed to >0.38ms for improved short community distances.    Baseline 04/27/21 0.130m    Time 12    Period Weeks    Status New    Target Date 07/22/21      PT LONG TERM GOAL #3   Title Pt will ambulate >400' with walker on varied level surfaces mod I for short community distances.    Baseline 04/27/21 75' min assit with RW    Time 12    Period Weeks    Status New    Target Date 07/22/21  PT LONG TERM GOAL #4   Title Pt will ambulate up/down 4 steps with rails mod I for improved community access.    Baseline unable    Time 12    Period Weeks    Status New    Target Date 07/22/21      PT LONG TERM GOAL #5   Title Berg TBD    Baseline TBD    Time 12    Period Weeks    Status New    Target Date 07/22/21                   Plan - 06/01/21 1054     Clinical Impression Statement Completed STG assessment with pt meeting five time sit to stand goal.  Unable to perform BERG at this time due to ongoing standing difficulties and possible need for further adjustment of prosthesis length; will assess at a future visit.  With build up under L shoe pt did report improvement in hip discomfort and demonstrated ability to fully extend R hip and knee.  Continued to add LE stretches to HEP.    Personal Factors and Comorbidities Comorbidity 3+;Time since onset of injury/illness/exacerbation    Comorbidities RA, DM2, HTN, PAD, DDD,  s/p multiple L LE revascularizations discharged from hospital a 05/05/20.  At that time  he was deemed to not be a candidate for further revasc procedures.  Dc'd home with a known bypass occlusion with a plan for AKA if worsening pain. Returned to ED 05/08/20  for increased pain and seeking amputation. s/p L AKA 05/11/20. Pt reports 3 shoulder surgeries on each shoulder and low back surgery    Examination-Activity Limitations Bathing;Locomotion Level;Transfers;Stairs;Stand;Squat;Lift    Examination-Participation Restrictions Church;Meal Prep    Stability/Clinical Decision Making Evolving/Moderate complexity    Rehab Potential Good    PT Frequency 2x / week   plus eval   PT Duration 12 weeks    PT Treatment/Interventions ADLs/Self Care Home Management;DME Instruction;Gait training;Stair training;Functional mobility training;Therapeutic activities;Therapeutic exercise;Balance training;Neuromuscular re-education;Manual techniques;Passive range of motion;Prosthetic Training;Patient/family education;Vestibular    PT Next Visit Plan Berg with prosthesis locked out and walker as needed most likely at this time to get a modified scoring. Continue standing with posterior pelvis against counter and reaching behind to counter working on upright posture, modified Thomas test stretch, try right hamstring stretch, right TKE in standing;  GAIT WITH PROSTHESIS LOCKED OUT RIGHT NOW as unable to lock on own, work on weight shifting over prosthesis.  Prosthetic education.    Consulted and Agree with Plan of Care Patient;Other (Comment)   friend, Timmothy Sours            Patient will benefit from skilled therapeutic intervention in order to improve the following deficits and impairments:  Abnormal gait, Decreased mobility, Decreased strength, Impaired flexibility, Decreased range of motion, Decreased knowledge of use of DME, Prosthetic Dependency, Decreased balance, Decreased activity tolerance  Visit Diagnosis: Other abnormalities of gait and mobility  Abnormal posture  Muscle weakness  (generalized)  Unsteadiness on feet     Problem List Patient Active Problem List   Diagnosis Date Noted   Paronychia of great toe 01/28/2021   Onychomycosis 01/28/2021   Critical limb ischemia with history of revascularization of same extremity ([place]) 04/28/2020   Limb ischemia 01/23/2020   Ischemic leg 01/22/2020   History of COVID-19 05/10/2019   PAD (peripheral artery disease) ([place])    History of critical lower limb ischemia 02/19/2019   S/P lumbar fusion, Lower back  pain 01/03/2018   Coronary artery disease involving native coronary artery of native heart with unstable angina pectoris [facility])    Preventative health care 03/13/2017   Transaminitis 12/16/2015   Bilateral shoulder pain 08/20/2012   Tobacco use disorder 08/20/2012   HTN (hypertension) 04/20/2011   HLD (hyperlipidemia) 10/08/2009   Rheumatoid arthritis ([place]) 08/11/2009    Rico Junker, PT, DPT 06/01/21    10:59 AM    [facility] [address] [place], [place], [address] Phone: [phone]   Fax:  [phone]  Name: [name] [name] MRN: [id number] Date of Birth: 1961/04/15

## 2021-06-03 ENCOUNTER — Ambulatory Visit: Payer: Medicare Other

## 2021-06-03 ENCOUNTER — Other Ambulatory Visit: Payer: Self-pay

## 2021-06-03 DIAGNOSIS — R2681 Unsteadiness on feet: Secondary | ICD-10-CM

## 2021-06-03 DIAGNOSIS — R2689 Other abnormalities of gait and mobility: Secondary | ICD-10-CM

## 2021-06-03 DIAGNOSIS — M6281 Muscle weakness (generalized): Secondary | ICD-10-CM

## 2021-06-03 NOTE — Therapy (Signed)
[facility] [address] [place], [place], [address] Phone: [phone]   Fax:  [phone]  Physical Therapy Treatment  Patient Details  Name: [name] [name] MRN: [id number] Date of Birth: 03/10/1961 Referring Provider (PT): left AKA   Encounter Date: 06/03/2021   PT End of Session - 06/03/21 1452     Visit Number 7    Number of Visits 25    Date for PT Re-Evaluation 07/22/21    Authorization Type [employer] medicare with medicaid secondary so 10th visit progress note    PT Start Time 1450    PT Stop Time 1530    PT Time Calculation (min) 40 min    Equipment Utilized During Treatment Gait belt    Activity Tolerance Patient tolerated treatment well    Behavior During Therapy [facility] for tasks assessed/performed             Past Medical History:  Diagnosis Date   Anemia    Anemia 08/06/2012   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   Hypertension    states is borderline on medication; has been on med. x 5-6 yr.   Hypokalemia 05/30/2018   Impingement syndrome of shoulder region 10/2014   left   Insomnia 06/01/2015   Ischemic ulcer of toe of left foot (HCC)    Left shoulder pain 12/16/2015   Low back pain without sciatica 10/29/2009   Lupus ([place])    Peripheral vascular disease (HCC)    Pneumonia    RA (rheumatoid arthritis) ([place])    Rotator cuff tear 11/04/2014    Past Surgical History:  Procedure Laterality Date   ABDOMINAL AORTAGRAM  02/20/2019   ABDOMINAL AORTOGRAM W/LOWER EXTREMITY N/A 02/20/2019   Procedure: ABDOMINAL AORTOGRAM W/LOWER EXTREMITY;  Surgeon: Marty Heck, MD;  Location: [place] [facility];  Service: Cardiovascular;  Laterality: N/A;   ACHILLES TENDON SURGERY Bilateral    AMPUTATION Left  05/11/2020   Procedure: LEFT ABOVE KNEE AMPUTATION;  Surgeon: Angelia Mould, MD;  Location: [facility];  Service: Vascular;  Laterality: Left;   ENDARTERECTOMY POPLITEAL Left 05/01/2020   Procedure: ENDARTERECTOMY POPLITEAL;  Surgeon: Waynetta Sandy, MD;  Location: [facility];  Service: Vascular;  Laterality: Left;   FASCIOTOMY Left 05/01/2020   Procedure: FASCIOTOMY;  Surgeon: Waynetta Sandy, MD;  Location: [place];  Service: Vascular;  Laterality: Left;   FEMORAL-POPLITEAL BYPASS GRAFT Left 02/26/2019   Procedure: BYPASS GRAFT FEMORAL-POPLITEAL ARTERY LEFT LEG USING 24mm PROPATEN GRAFT;  Surgeon: Serafina Mitchell, MD;  Location: [facility];  Service: Vascular;  Laterality: Left;   INTRAOPERATIVE ARTERIOGRAM Left 01/22/2020   Procedure: INTRA OPERATIVE ARTERIOGRAM with administration of thrombolyics in Left femoral to popliteal bypass.;  Surgeon: Marty Heck, MD;  Location: [facility];  Service: Vascular;  Laterality: Left;   LEFT HEART CATH AND CORONARY ANGIOGRAPHY N/A 03/15/2017   Procedure: LEFT HEART CATH AND CORONARY ANGIOGRAPHY;  Surgeon: Nelva Bush, MD;  Location: [facility];  Service: Cardiovascular;  Laterality: N/A;   LOWER EXTREMITY INTERVENTION Left 04/29/2020   Procedure: LOWER EXTREMITY INTERVENTION- LYSIS;  Surgeon: Marty Heck, MD;  Location: [facility];  Service: Cardiovascular;  Laterality: Left;   OSTEOTOMY AND ULNAR SHORTENING Right 07/22/2002  PATCH ANGIOPLASTY Left 05/01/2020   Procedure: [place];  Surgeon: Waynetta Sandy, MD;  Location: [facility];  Service: Vascular;  Laterality: Left;   PERIPHERAL VASCULAR ATHERECTOMY  01/23/2020   Procedure: PERIPHERAL VASCULAR ATHERECTOMY;  Surgeon: Marty Heck, MD;  Location: [facility] [facility];  Service: Cardiovascular;;   PERIPHERAL VASCULAR BALLOON ANGIOPLASTY  01/23/2020   Procedure: PERIPHERAL VASCULAR BALLOON ANGIOPLASTY;   Surgeon: Marty Heck, MD;  Location: [place] [facility];  Service: Cardiovascular;;   PERIPHERAL VASCULAR INTERVENTION Left 04/30/2020   Procedure: PERIPHERAL VASCULAR INTERVENTION;  Surgeon: Cherre Robins, MD;  Location: [place] [facility];  Service: Cardiovascular;  Laterality: Left;   PERIPHERAL VASCULAR THROMBECTOMY N/A 01/23/2020   Procedure: lysis recheck;  Surgeon: Marty Heck, MD;  Location: [facility];  Service: Cardiovascular;  Laterality: N/A;  + [place]    PERIPHERAL VASCULAR THROMBECTOMY N/A 04/30/2020   Procedure: Lysis Recheck;  Surgeon: Cherre Robins, MD;  Location: [place] [facility] [facility];  Service: Cardiovascular;  Laterality: N/A;   SHOULDER ARTHROSCOPY WITH BICEPSTENOTOMY Right 03/11/2015   Procedure: SHOULDER ARTHROSCOPY WITH BICEPSTENOTOMY;  Surgeon: Ninetta Lights, MD;  Location: [facility];  Service: Orthopedics;  Laterality: Right;   SHOULDER ARTHROSCOPY WITH DISTAL CLAVICLE RESECTION Left 11/19/2014   Procedure: SHOULDER ARTHROSCOPY WITH DISTAL CLAVICLE RESECTION;  Surgeon: Kathryne Hitch, MD;  Location: [place] [facility];  Service: Orthopedics;  Laterality: Left;   SHOULDER ARTHROSCOPY WITH ROTATOR CUFF REPAIR AND SUBACROMIAL DECOMPRESSION Left 11/19/2014   Procedure: LEFT SHOULDER ARTHROSCOPY, DEBRIDEMENT DISTAL CLAVICLE EXCISION, ACROMIOPLASTY WITH ROTATOR CUFF REPAIR ;  Surgeon: Kathryne Hitch, MD;  Location: [facility];  Service: Orthopedics;  Laterality: Left;   THROMBECTOMY OF BYPASS GRAFT FEMORAL- POPLITEAL ARTERY Left 05/01/2020   Procedure: THROMBECTOMY OF LOWER EXTREMITY;  Surgeon: Waynetta Sandy, MD;  Location: [facility];  Service: Vascular;  Laterality: Left;   TRANSFORAMINAL LUMBAR INTERBODY FUSION (TLIF) WITH PEDICLE SCREW FIXATION 1 LEVEL N/A 01/03/2018   Procedure: TRANSFORAMINAL LUMBAR INTERBODY FUSION (TLIF) LUMBAR FIVE-SACRAL ONE;  Surgeon: Melina Schools, MD;  Location: [facility] [place];  Service:  Orthopedics;  Laterality: N/A;   ULTRASOUND GUIDANCE FOR VASCULAR ACCESS Right 01/22/2020   Procedure: ULTRASOUND GUIDANCE FOR VASCULAR ACCESS Right Common Femoral Artery.;  Surgeon: Marty Heck, MD;  Location: [facility];  Service: Vascular;  Laterality: Right;   VIDEO ASSISTED THORACOSCOPY (VATS)/DECORTICATION Right 11/21/2011   drainage of empyema    There were no vitals filed for this visit.   Subjective Assessment - 06/03/21 1452     Subjective Pt reports that he feels like when he straightens right leg his prosthesis is floating still even though he is all the way down.    Patient is accompained by: --   friend, [name] [name]   Pertinent History RA, DM2, HTN, PAD, DDD,  s/p multiple L LE revascularizations discharged from hospital a 05/05/20.  At that time he was deemed to not be a candidate for further revasc procedures.  Dc'd home with a known bypass occlusion with a plan for AKA if worsening pain. Returned to ED 05/08/20  for increased pain and seeking amputation. s/p L AKA 05/11/20    Patient Stated Goals Pt wants to be able to walk better.    Currently in Pain? Yes    Pain Score 3     Pain Location Back    Pain Orientation Lower    Pain Descriptors / Indicators Aching  Pain Type Chronic pain    Pain Onset More than a month ago    Pain Frequency Intermittent                               [facility] Adult PT Treatment/Exercise - 06/03/21 1453       Transfers   Transfers Sit to Stand;Stand to Sit    Sit to Stand 5: Supervision    Sit to Stand Details Verbal cues for technique    Sit to Stand Details (indicate cue type and reason) with knee unlocked on first attempt and then locked on other attempts. When knee was unlocked PT had to assist to get knee to lock out.    Stand to Sit 5: Supervision    Stand to Sit Details (indicate cue type and reason) Verbal cues for technique    Stand to Sit Details Pt was cued to kick left leg out prior to sitting with knee locked.       Ambulation/Gait   Ambulation/Gait Yes    Ambulation/Gait Assistance 4: Min guard;4: Min assist    Ambulation/Gait Assistance Details Pt was cued to stay up tall and shift over left leg. As pt went on more movement noted in socket with it internally rotating. Pt reports that he tried adding on another sock but then finds he can't get all the way donn in socket as tight at top. Knee locked during gait.    Ambulation Distance (Feet) 70 Feet    Assistive device Rolling walker;Prosthesis    Gait Pattern Step-through pattern;Decreased stance time - left;Decreased weight shift to left;Trunk flexed    Ambulation Surface Level;Indoor      Exercises   Exercises Other Exercises    Other Exercises  Performed seated hamstring stretch but pt couldn't get enough stretch and concerned for safety especially if prosthesis not donned. Performed supine hamstring stretch with belt on right leg x 30 sec. Discussed performing at home 30 sec x 3, 2-3 x/day.      Prosthetics   Prosthetic Care Comments  PT again looked at leg length in standing with prosthesis locked. Pt reports that if he straightens right knee he feels as though prosthesis is sliding down. PT trialed 15 mm lift and pt still having that feeling. Added 11 mm lift as well and pt reported feeling better. PT called prosthetist, [name] [name], to discuss issues with socket sliding around and possible need for adjustments or new prosthesis. Also discussed leg length discrepancy with prosthesis appearing about an inch short. [name] [name] wants pt to call so he can get him in next week.    Current prosthetic wear tolerance (days/week)  daily    Current prosthetic wear tolerance (#hours/day)  3-5 hours a day    Education Provided Proper Donning;Correct ply sock adjustment    Person(s) Educated Patient    Education Method Explanation    Education Method Verbalized understanding                     PT Education - 06/03/21 1632     Education Details  Discussed supine hamstring stretch with belt for home    Person(s) Educated Patient    Methods Explanation;Demonstration;Handout    Comprehension Verbalized understanding;Returned demonstration              PT Short Term Goals - 06/01/21 1057       PT SHORT TERM GOAL #1   Title Pt  will be able to tolerate wearing prosthesis all awake hours for improved function.    Baseline 6 hours/day- has not increased from here yet    Time 4    Period Weeks    Status Not Met    Target Date 07/01/21      PT SHORT TERM GOAL #2   Title Pt will be independent with prosthetic management for improved function.    Baseline currently needs cues    Time 4    Period Weeks    Status Not Met    Target Date 07/01/21      PT SHORT TERM GOAL #3   Title Pt will be able to ambulate 200' with RW initiating prosthetic knee flexion on level surfaces supervision for improved household mobility.    Baseline 48' min assist/CGA with RW and prosthesis locked out    Time 4    Period Weeks    Status Not Met    Target Date 07/01/21      PT SHORT TERM GOAL #4   Title Pt will decrease 5 x sit to stand to </= 18 sec for improved balance and functional mobility.    Baseline 04/27/21 29.56 sec from chair with hands > 22 seconds from mat with hands and RW    Time 4    Period Weeks    Status Revised    Target Date 07/01/21      PT SHORT TERM GOAL #5   Title Berg Balance will be assessed and LTG written    Baseline not assessed; may require further adjustment of prosthesis to improve leg length    Time 4    Period Weeks    Status On-going    Target Date 07/01/21               PT Long Term Goals - 04/28/21 1853       PT LONG TERM GOAL #1   Title Pt will be independent with HEP for strengthening, ROM and balance to continue gains on own.    Baseline no current HEP    Time 12    Period Weeks    Status New    Target Date 07/22/21      PT LONG TERM GOAL #2   Title Pt will increase gait speed to  >0.55ms for improved short community distances.    Baseline 04/27/21 0.140m    Time 12    Period Weeks    Status New    Target Date 07/22/21      PT LONG TERM GOAL #3   Title Pt will ambulate >400' with walker on varied level surfaces mod I for short community distances.    Baseline 04/27/21 75' min assit with RW    Time 12    Period Weeks    Status New    Target Date 07/22/21      PT LONG TERM GOAL #4   Title Pt will ambulate up/down 4 steps with rails mod I for improved community access.    Baseline unable    Time 12    Period Weeks    Status New    Target Date 07/22/21      PT LONG TERM GOAL #5   Title Berg TBD    Baseline TBD    Time 12    Period Weeks    Status New    Target Date 07/22/21  Plan - 06/03/21 1631     Clinical Impression Statement Pt did have decreased hamstring length on right to about 45 degrees. Added in supine hamstring stretch to HEP. There does seem to be leg length descrepancy on prosthesis being about an inch short. Pt is having trouble with socket sliding down and internally rotating. PT has communicated with [name] [name] the prosthetist to see if he can address.    Personal Factors and Comorbidities Comorbidity 3+;Time since onset of injury/illness/exacerbation    Comorbidities RA, DM2, HTN, PAD, DDD,  s/p multiple L LE revascularizations discharged from hospital a 05/05/20.  At that time he was deemed to not be a candidate for further revasc procedures.  Dc'd home with a known bypass occlusion with a plan for AKA if worsening pain. Returned to ED 05/08/20  for increased pain and seeking amputation. s/p L AKA 05/11/20. Pt reports 3 shoulder surgeries on each shoulder and low back surgery    Examination-Activity Limitations Bathing;Locomotion Level;Transfers;Stairs;Stand;Squat;Lift    Examination-Participation Restrictions Church;Meal Prep    Stability/Clinical Decision Making Evolving/Moderate complexity    Rehab Potential Good     PT Frequency 2x / week   plus eval   PT Duration 12 weeks    PT Treatment/Interventions ADLs/Self Care Home Management;DME Instruction;Gait training;Stair training;Functional mobility training;Therapeutic activities;Therapeutic exercise;Balance training;Neuromuscular re-education;Manual techniques;Passive range of motion;Prosthetic Training;Patient/family education;Vestibular    PT Next Visit Plan Did pt see [name] [name] and how did it go? Wanted him to hold until he saw him. Berg with prosthesis locked out and walker as needed most likely at this time to get a modified scoring when able. Continue standing with posterior pelvis against counter and reaching behind to counter working on upright posture, modified Thomas test stretch, try right hamstring stretch, right TKE in standing;  GAIT WITH PROSTHESIS LOCKED OUT RIGHT NOW as unable to lock on own, work on weight shifting over prosthesis.  Prosthetic education.    Consulted and Agree with Plan of Care Patient;Other (Comment)   friend, [name] [name]            Patient will benefit from skilled therapeutic intervention in order to improve the following deficits and impairments:  Abnormal gait, Decreased mobility, Decreased strength, Impaired flexibility, Decreased range of motion, Decreased knowledge of use of DME, Prosthetic Dependency, Decreased balance, Decreased activity tolerance  Visit Diagnosis: Other abnormalities of gait and mobility  Muscle weakness (generalized)  Unsteadiness on feet     Problem List Patient Active Problem List   Diagnosis Date Noted   Paronychia of great toe 01/28/2021   Onychomycosis 01/28/2021   Critical limb ischemia with history of revascularization of same extremity ([place]) 04/28/2020   Limb ischemia 01/23/2020   Ischemic leg 01/22/2020   History of COVID-19 05/10/2019   PAD (peripheral artery disease) ([place])    History of critical lower limb ischemia 02/19/2019   S/P lumbar fusion, Lower back pain 01/03/2018    Coronary artery disease involving native coronary artery of native heart with unstable angina pectoris [facility])    Preventative health care 03/13/2017   Transaminitis 12/16/2015   Bilateral shoulder pain 08/20/2012   Tobacco use disorder 08/20/2012   HTN (hypertension) 04/20/2011   HLD (hyperlipidemia) 10/08/2009   Rheumatoid arthritis ([place]) 08/11/2009    Electa Sniff, PT, DPT, NCS 06/03/2021, 4:37 PM  [facility] [address] [place], [place], [address] Phone: [phone]   Fax:  [phone]  Name: [name] [name] MRN: [id number] Date of Birth: 03/21/1961

## 2021-06-07 ENCOUNTER — Ambulatory Visit: Payer: Medicare Other | Admitting: Physical Therapy

## 2021-06-10 ENCOUNTER — Ambulatory Visit: Payer: Medicare Other

## 2021-06-10 ENCOUNTER — Other Ambulatory Visit: Payer: Self-pay

## 2021-06-10 DIAGNOSIS — R2689 Other abnormalities of gait and mobility: Secondary | ICD-10-CM | POA: Diagnosis not present

## 2021-06-10 DIAGNOSIS — R293 Abnormal posture: Secondary | ICD-10-CM

## 2021-06-10 DIAGNOSIS — M6281 Muscle weakness (generalized): Secondary | ICD-10-CM

## 2021-06-10 DIAGNOSIS — R2681 Unsteadiness on feet: Secondary | ICD-10-CM

## 2021-06-10 NOTE — Therapy (Signed)
[facility] [address] [place], [place], [address] Phone: [phone]   Fax:  [phone]  Physical Therapy Treatment  Patient Details  Name: [name] [name] MRN: [id number] Date of Birth: 09/29/1960 Referring Provider (PT): left AKA   Encounter Date: 06/10/2021   PT End of Session - 06/10/21 1448     Visit Number 8    Number of Visits 25    Date for PT Re-Evaluation 07/22/21    Authorization Type UHC medicare with medicaid secondary so 10th visit progress note    PT Start Time 1445    PT Stop Time 1530    PT Time Calculation (min) 45 min    Equipment Utilized During Treatment Gait belt    Activity Tolerance Patient tolerated treatment well    Behavior During Therapy [facility] for tasks assessed/performed             Past Medical History:  Diagnosis Date   Anemia    Anemia 08/06/2012   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   Hypertension    states is borderline on medication; has been on med. x 5-6 yr.   Hypokalemia 05/30/2018   Impingement syndrome of shoulder region 10/2014   left   Insomnia 06/01/2015   Ischemic ulcer of toe of left foot (HCC)    Left shoulder pain 12/16/2015   Low back pain without sciatica 10/29/2009   Lupus ([place])    Peripheral vascular disease (HCC)    Pneumonia    RA (rheumatoid arthritis) ([place])    Rotator cuff tear 11/04/2014    Past Surgical History:  Procedure Laterality Date   ABDOMINAL AORTAGRAM  02/20/2019   ABDOMINAL AORTOGRAM W/LOWER EXTREMITY N/A 02/20/2019   Procedure: ABDOMINAL AORTOGRAM W/LOWER EXTREMITY;  Surgeon: Marty Heck, MD;  Location: [facility];  Service: Cardiovascular;  Laterality: N/A;   ACHILLES TENDON SURGERY Bilateral    AMPUTATION Left  05/11/2020   Procedure: LEFT ABOVE KNEE AMPUTATION;  Surgeon: Angelia Mould, MD;  Location: [place];  Service: Vascular;  Laterality: Left;   ENDARTERECTOMY POPLITEAL Left 05/01/2020   Procedure: ENDARTERECTOMY POPLITEAL;  Surgeon: Waynetta Sandy, MD;  Location: [facility];  Service: Vascular;  Laterality: Left;   FASCIOTOMY Left 05/01/2020   Procedure: FASCIOTOMY;  Surgeon: Waynetta Sandy, MD;  Location: [facility];  Service: Vascular;  Laterality: Left;   FEMORAL-POPLITEAL BYPASS GRAFT Left 02/26/2019   Procedure: BYPASS GRAFT FEMORAL-POPLITEAL ARTERY LEFT LEG USING 47m PROPATEN GRAFT;  Surgeon: BSerafina Mitchell MD;  [place] [facility]  Service: Vascular;  Laterality: Left;   INTRAOPERATIVE ARTERIOGRAM Left 01/22/2020   Procedure: INTRA OPERATIVE ARTERIOGRAM with administration of thrombolyics in Left femoral to popliteal bypass.;  Surgeon: CMarty Heck MD;  [place] [facility]  Service: Vascular;  Laterality: Left;   LEFT HEART CATH AND CORONARY ANGIOGRAPHY N/A 03/15/2017   Procedure: LEFT HEART CATH AND CORONARY ANGIOGRAPHY;  Surgeon: ENelva Bush MD;  [facility];  Service: Cardiovascular;  Laterality: N/A;   LOWER EXTREMITY INTERVENTION Left 04/29/2020   Procedure: LOWER EXTREMITY INTERVENTION- LYSIS;  Surgeon: CMarty Heck MD;  [facility];  Service: Cardiovascular;  Laterality: Left;   OSTEOTOMY AND ULNAR SHORTENING Right 07/22/2002  PATCH ANGIOPLASTY Left 05/01/2020   Procedure: [place];  Surgeon: Waynetta Sandy, MD;  Location: [facility];  Service: Vascular;  Laterality: Left;   PERIPHERAL VASCULAR ATHERECTOMY  01/23/2020   Procedure: PERIPHERAL VASCULAR ATHERECTOMY;  Surgeon: Marty Heck, MD;  Location: [facility];  Service: Cardiovascular;;   PERIPHERAL VASCULAR BALLOON ANGIOPLASTY  01/23/2020   Procedure: PERIPHERAL VASCULAR BALLOON ANGIOPLASTY;   Surgeon: Marty Heck, MD;  Location: [facility];  Service: Cardiovascular;;   PERIPHERAL VASCULAR INTERVENTION Left 04/30/2020   Procedure: PERIPHERAL VASCULAR INTERVENTION;  Surgeon: Cherre Robins, MD;  Location: [employer] [facility] [facility];  Service: Cardiovascular;  Laterality: Left;   PERIPHERAL VASCULAR THROMBECTOMY N/A 01/23/2020   Procedure: lysis recheck;  Surgeon: Marty Heck, MD;  Location: [facility];  Service: Cardiovascular;  Laterality: N/A;  + [place]    PERIPHERAL VASCULAR THROMBECTOMY N/A 04/30/2020   Procedure: Lysis Recheck;  Surgeon: Cherre Robins, MD;  Location: [place] [facility];  Service: Cardiovascular;  Laterality: N/A;   SHOULDER ARTHROSCOPY WITH BICEPSTENOTOMY Right 03/11/2015   Procedure: SHOULDER ARTHROSCOPY WITH BICEPSTENOTOMY;  Surgeon: Ninetta Lights, MD;  Location: [facility];  Service: Orthopedics;  Laterality: Right;   SHOULDER ARTHROSCOPY WITH DISTAL CLAVICLE RESECTION Left 11/19/2014   Procedure: SHOULDER ARTHROSCOPY WITH DISTAL CLAVICLE RESECTION;  Surgeon: Kathryne Hitch, MD;  Location: [facility];  Service: Orthopedics;  Laterality: Left;   SHOULDER ARTHROSCOPY WITH ROTATOR CUFF REPAIR AND SUBACROMIAL DECOMPRESSION Left 11/19/2014   Procedure: LEFT SHOULDER ARTHROSCOPY, DEBRIDEMENT DISTAL CLAVICLE EXCISION, ACROMIOPLASTY WITH ROTATOR CUFF REPAIR ;  Surgeon: Kathryne Hitch, MD;  Location: [facility];  Service: Orthopedics;  Laterality: Left;   THROMBECTOMY OF BYPASS GRAFT FEMORAL- POPLITEAL ARTERY Left 05/01/2020   Procedure: THROMBECTOMY OF LOWER EXTREMITY;  Surgeon: Waynetta Sandy, MD;  Location: [facility];  Service: Vascular;  Laterality: Left;   TRANSFORAMINAL LUMBAR INTERBODY FUSION (TLIF) WITH PEDICLE SCREW FIXATION 1 LEVEL N/A 01/03/2018   Procedure: TRANSFORAMINAL LUMBAR INTERBODY FUSION (TLIF) LUMBAR FIVE-SACRAL ONE;  Surgeon: Melina Schools, MD;  Location: [facility];  Service:  Orthopedics;  Laterality: N/A;   ULTRASOUND GUIDANCE FOR VASCULAR ACCESS Right 01/22/2020   Procedure: ULTRASOUND GUIDANCE FOR VASCULAR ACCESS Right Common Femoral Artery.;  Surgeon: Marty Heck, MD;  Location: [facility];  Service: Vascular;  Laterality: Right;   VIDEO ASSISTED THORACOSCOPY (VATS)/DECORTICATION Right 11/21/2011   drainage of empyema    There were no vitals filed for this visit.   Subjective Assessment - 06/10/21 1448     Subjective Pt reports that prosthetist made some adjustments to his prosthesis. Reports they lengthened prosthesis a little and added a cut sock to prosthesis to get more support lower down. Seems to be fitting a little better but has not been on much yet.    Patient is accompained by: --   friend, Don   Pertinent History RA, DM2, HTN, PAD, DDD,  s/p multiple L LE revascularizations discharged from hospital a 05/05/20.  At that time he was deemed to not be a candidate for further revasc procedures.  Dc'd home with a known bypass occlusion with a plan for AKA if worsening pain. Returned to ED 05/08/20  for increased pain and seeking amputation. s/p L AKA 05/11/20    Patient Stated Goals Pt wants to be able to walk better.    Currently in Pain? Yes    Pain Score 4  Pain Location Hip    Pain Orientation Left    Pain Descriptors / Indicators Aching    Pain Type Chronic pain    Pain Onset More than a month ago    Pain Frequency Intermittent    Aggravating Factors  when up on more                               [facility] Adult PT Treatment/Exercise - 06/10/21 1451       Transfers   Transfers Sit to Stand;Stand to Sit    Sit to Stand 5: Supervision;4: Min guard    Sit to Stand Details Verbal cues for technique    Sit to Stand Details (indicate cue type and reason) with knee unlocked on first bout then locked out on following bouts.    Stand to Sit 4: Min guard    Stand to Sit Details (indicate cue type and reason) Verbal cues for  technique    Stand to Sit Details Pt was cued to be sure to kick out LLE prior to going to sit as prosthesis locked out.      Ambulation/Gait   Ambulation/Gait Yes    Ambulation/Gait Assistance 4: Min guard;4: Min assist    Ambulation/Gait Assistance Details Pt was cued to shorten prosthetic step and increase right step length. Also verbal and tactile cues to increase left weight shift and stay up tall. Pt had to take seated rest breaks mostly due to numbness/ some discomfort in left hand/arm. PT discussed possibly applying washcloth with duct tape to pad hand grip. Pt reports that he is going back to ortho doc that did the knuckle replacements in the past as issue has increased since his fall a few weeks back. Goes on 06/30/21.    Ambulation Distance (Feet) 57 Feet   58' x 1, 86' x 1   Assistive device Rolling walker;Prosthesis    Gait Pattern Step-through pattern;Decreased stance time - left;Decreased weight shift to left;Trunk flexed    Ambulation Surface Level;Indoor    Gait Comments In // bars working on improving left weight shift with verbal/tactile cues and visual cues in mirror as well: walking forwards and backwards 8' x 3 each direction. PT blocking trunk flexion when trying to step back with LLE which made very challenging. Cued to stay up tall through RLE to help advance LLE. Broke down further to just stepping forward and weight shifting onto left then stepping back with left x 5.      Prosthetics   Prosthetic Care Comments  Pt reports he has been putting antiperspirant on and that has been helping with sweating. Advised to put on at night and be sure that no residue when donning liner. Also to continue to remove liner to dry skin as needed if becoming more moist from sweat. Per pt prosthetist lengthened his prosthesis some and cut a sock in half to increase thickness just near end of residual limb. Pt was able to stay more erect with RLE fully extended with changes without feeling like  raising up out of prosthesis.    Current prosthetic wear tolerance (days/week)  daily    Current prosthetic wear tolerance (#hours/day)  3 hours, 2x/day    Education Provided Residual limb care    Person(s) Educated Patient    Education Method Explanation    Education Method Verbalized understanding  PT Short Term Goals - 06/01/21 1057       PT SHORT TERM GOAL #1   Title Pt will be able to tolerate wearing prosthesis all awake hours for improved function.    Baseline 6 hours/day- has not increased from here yet    Time 4    Period Weeks    Status Not Met    Target Date 07/01/21      PT SHORT TERM GOAL #2   Title Pt will be independent with prosthetic management for improved function.    Baseline currently needs cues    Time 4    Period Weeks    Status Not Met    Target Date 07/01/21      PT SHORT TERM GOAL #3   Title Pt will be able to ambulate 200' with RW initiating prosthetic knee flexion on level surfaces supervision for improved household mobility.    Baseline 48' min assist/CGA with RW and prosthesis locked out    Time 4    Period Weeks    Status Not Met    Target Date 07/01/21      PT SHORT TERM GOAL #4   Title Pt will decrease 5 x sit to stand to </= 18 sec for improved balance and functional mobility.    Baseline 04/27/21 29.56 sec from chair with hands > 22 seconds from mat with hands and RW    Time 4    Period Weeks    Status Revised    Target Date 07/01/21      PT SHORT TERM GOAL #5   Title Berg Balance will be assessed and LTG written    Baseline not assessed; may require further adjustment of prosthesis to improve leg length    Time 4    Period Weeks    Status On-going    Target Date 07/01/21               PT Long Term Goals - 04/28/21 1853       PT LONG TERM GOAL #1   Title Pt will be independent with HEP for strengthening, ROM and balance to continue gains on own.    Baseline no current HEP    Time 12     Period Weeks    Status New    Target Date 07/22/21      PT LONG TERM GOAL #2   Title Pt will increase gait speed to >0.78ms for improved short community distances.    Baseline 04/27/21 0.164m    Time 12    Period Weeks    Status New    Target Date 07/22/21      PT LONG TERM GOAL #3   Title Pt will ambulate >400' with walker on varied level surfaces mod I for short community distances.    Baseline 04/27/21 75' min assit with RW    Time 12    Period Weeks    Status New    Target Date 07/22/21      PT LONG TERM GOAL #4   Title Pt will ambulate up/down 4 steps with rails mod I for improved community access.    Baseline unable    Time 12    Period Weeks    Status New    Target Date 07/22/21      PT LONG TERM GOAL #5   Title Berg TBD    Baseline TBD    Time 12    Period Weeks    Status New  Target Date 07/22/21                   Plan - 06/10/21 1545     Clinical Impression Statement Pt responded well to changes that prosthetist made to prosthesis with lengthening some and using half sock. He was able to walk without residual limb rotating today and reported feeling better in hips. Pt still challenged with activating gluts which was notable with trying to work on backwards steps. PT continued to work on trying to increase left weight shift with erect posture.    Personal Factors and Comorbidities Comorbidity 3+;Time since onset of injury/illness/exacerbation    Comorbidities RA, DM2, HTN, PAD, DDD,  s/p multiple L LE revascularizations discharged from hospital a 05/05/20.  At that time he was deemed to not be a candidate for further revasc procedures.  Dc'd home with a known bypass occlusion with a plan for AKA if worsening pain. Returned to ED 05/08/20  for increased pain and seeking amputation. s/p L AKA 05/11/20. Pt reports 3 shoulder surgeries on each shoulder and low back surgery    Examination-Activity Limitations Bathing;Locomotion  Level;Transfers;Stairs;Stand;Squat;Lift    Examination-Participation Restrictions Church;Meal Prep    Stability/Clinical Decision Making Evolving/Moderate complexity    Rehab Potential Good    PT Frequency 2x / week   plus eval   PT Duration 12 weeks    PT Treatment/Interventions ADLs/Self Care Home Management;DME Instruction;Gait training;Stair training;Functional mobility training;Therapeutic activities;Therapeutic exercise;Balance training;Neuromuscular re-education;Manual techniques;Passive range of motion;Prosthetic Training;Patient/family education;Vestibular    PT Next Visit Plan Check how pt has socks donned if prosthetist did put half sock on. Berg with prosthesis locked out and walker as needed most likely at this time to get a modified scoring when able. Continue standing with posterior pelvis against counter and reaching behind to counter working on upright posture, modified Thomas test stretch, try right hamstring stretch, right TKE in standing;  GAIT WITH PROSTHESIS LOCKED OUT RIGHT NOW as unable to lock on own, work on weight shifting over prosthesis.  Prosthetic education.    Consulted and Agree with Plan of Care Patient;Other (Comment)   friend, Timmothy Sours            Patient will benefit from skilled therapeutic intervention in order to improve the following deficits and impairments:  Abnormal gait, Decreased mobility, Decreased strength, Impaired flexibility, Decreased range of motion, Decreased knowledge of use of DME, Prosthetic Dependency, Decreased balance, Decreased activity tolerance  Visit Diagnosis: Other abnormalities of gait and mobility  Muscle weakness (generalized)  Unsteadiness on feet  Abnormal posture     Problem List Patient Active Problem List   Diagnosis Date Noted   Paronychia of great toe 01/28/2021   Onychomycosis 01/28/2021   Critical limb ischemia with history of revascularization of same extremity ([place]) 04/28/2020   Limb ischemia 01/23/2020    Ischemic leg 01/22/2020   History of COVID-19 05/10/2019   PAD (peripheral artery disease) ([place])    History of critical lower limb ischemia 02/19/2019   S/P lumbar fusion, Lower back pain 01/03/2018   Coronary artery disease involving native coronary artery of native heart with unstable angina pectoris ([place])    Preventative health care 03/13/2017   Transaminitis 12/16/2015   Bilateral shoulder pain 08/20/2012   Tobacco use disorder 08/20/2012   HTN (hypertension) 04/20/2011   HLD (hyperlipidemia) 10/08/2009   Rheumatoid arthritis ([place]) 08/11/2009    Electa Sniff, PT, DPT, NCS 06/10/2021, 3:48 PM  [facility] [address]  [place], [place], [address] Phone: [phone]   Fax:  [phone]  Name: [name] [name] MRN: [id number] Date of Birth: 09/08/1960

## 2021-06-13 ENCOUNTER — Other Ambulatory Visit: Payer: Self-pay

## 2021-06-13 ENCOUNTER — Ambulatory Visit: Payer: Medicare Other

## 2021-06-13 DIAGNOSIS — R2689 Other abnormalities of gait and mobility: Secondary | ICD-10-CM

## 2021-06-13 DIAGNOSIS — M6281 Muscle weakness (generalized): Secondary | ICD-10-CM

## 2021-06-13 DIAGNOSIS — R2681 Unsteadiness on feet: Secondary | ICD-10-CM

## 2021-06-13 NOTE — Therapy (Signed)
[facility] [address] [place], [place], [address] Phone: [phone]   Fax:  [phone]  Physical Therapy Treatment  Patient Details  Name: [name] [name] MRN: [id number] Date of Birth: 12-Jul-1960 Referring Provider (PT): left AKA   Encounter Date: 06/13/2021   PT End of Session - 06/13/21 1444     Visit Number 9    Number of Visits 25    Date for PT Re-Evaluation 07/22/21    Authorization Type UHC medicare with medicaid secondary so 10th visit progress note    PT Start Time 1444    PT Stop Time 1529    PT Time Calculation (min) 45 min    Equipment Utilized During Treatment Gait belt    Activity Tolerance Patient tolerated treatment well    Behavior During Therapy [facility] for tasks assessed/performed             Past Medical History:  Diagnosis Date   Anemia    Anemia 08/06/2012   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   Hypertension    states is borderline on medication; has been on med. x 5-6 yr.   Hypokalemia 05/30/2018   Impingement syndrome of shoulder region 10/2014   left   Insomnia 06/01/2015   Ischemic ulcer of toe of left foot (HCC)    Left shoulder pain 12/16/2015   Low back pain without sciatica 10/29/2009   Lupus ([place])    Peripheral vascular disease (HCC)    Pneumonia    RA (rheumatoid arthritis) ([place])    Rotator cuff tear 11/04/2014    Past Surgical History:  Procedure Laterality Date   ABDOMINAL AORTAGRAM  02/20/2019   ABDOMINAL AORTOGRAM W/LOWER EXTREMITY N/A 02/20/2019   Procedure: ABDOMINAL AORTOGRAM W/LOWER EXTREMITY;  Surgeon: Marty Heck, MD;  Location: [facility];  Service: Cardiovascular;  Laterality: N/A;   ACHILLES TENDON SURGERY Bilateral    AMPUTATION Left  05/11/2020   Procedure: LEFT ABOVE KNEE AMPUTATION;  Surgeon: Angelia Mould, MD;  Location: [facility];  Service: Vascular;  Laterality: Left;   ENDARTERECTOMY POPLITEAL Left 05/01/2020   Procedure: ENDARTERECTOMY POPLITEAL;  Surgeon: Waynetta Sandy, MD;  Location: [place];  Service: Vascular;  Laterality: Left;   FASCIOTOMY Left 05/01/2020   Procedure: FASCIOTOMY;  Surgeon: Waynetta Sandy, MD;  Location: [facility];  Service: Vascular;  Laterality: Left;   FEMORAL-POPLITEAL BYPASS GRAFT Left 02/26/2019   Procedure: BYPASS GRAFT FEMORAL-POPLITEAL ARTERY LEFT LEG USING 22m PROPATEN GRAFT;  Surgeon: BSerafina Mitchell MD;  [place] [facility]  Service: Vascular;  Laterality: Left;   INTRAOPERATIVE ARTERIOGRAM Left 01/22/2020   Procedure: INTRA OPERATIVE ARTERIOGRAM with administration of thrombolyics in Left femoral to popliteal bypass.;  Surgeon: CMarty Heck MD;  Location[place] [facility]  Service: Vascular;  Laterality: Left;   LEFT HEART CATH AND CORONARY ANGIOGRAPHY N/A 03/15/2017   Procedure: LEFT HEART CATH AND CORONARY ANGIOGRAPHY;  Surgeon: ENelva Bush MD;  [facility];  Service: Cardiovascular;  Laterality: N/A;   LOWER EXTREMITY INTERVENTION Left 04/29/2020   Procedure: LOWER EXTREMITY INTERVENTION- LYSIS;  Surgeon: CMarty Heck MD;  [facility];  Service: Cardiovascular;  Laterality: Left;   OSTEOTOMY AND ULNAR SHORTENING Right 07/22/2002  PATCH ANGIOPLASTY Left 05/01/2020   Procedure: [place];  Surgeon: Waynetta Sandy, MD;  Location: [facility];  Service: Vascular;  Laterality: Left;   PERIPHERAL VASCULAR ATHERECTOMY  01/23/2020   Procedure: PERIPHERAL VASCULAR ATHERECTOMY;  Surgeon: Marty Heck, MD;  Location: [place] [facility];  Service: Cardiovascular;;   PERIPHERAL VASCULAR BALLOON ANGIOPLASTY  01/23/2020   Procedure: PERIPHERAL VASCULAR BALLOON ANGIOPLASTY;   Surgeon: Marty Heck, MD;  Location: [facility];  Service: Cardiovascular;;   PERIPHERAL VASCULAR INTERVENTION Left 04/30/2020   Procedure: PERIPHERAL VASCULAR INTERVENTION;  Surgeon: Cherre Robins, MD;  Location: [place] [facility];  Service: Cardiovascular;  Laterality: Left;   PERIPHERAL VASCULAR THROMBECTOMY N/A 01/23/2020   Procedure: lysis recheck;  Surgeon: Marty Heck, MD;  Location: [place] [facility];  Service: Cardiovascular;  Laterality: N/A;  + [place]    PERIPHERAL VASCULAR THROMBECTOMY N/A 04/30/2020   Procedure: Lysis Recheck;  Surgeon: Cherre Robins, MD;  Location: [facility] [facility] [facility];  Service: Cardiovascular;  Laterality: N/A;   SHOULDER ARTHROSCOPY WITH BICEPSTENOTOMY Right 03/11/2015   Procedure: SHOULDER ARTHROSCOPY WITH BICEPSTENOTOMY;  Surgeon: Ninetta Lights, MD;  Location: [place];  Service: Orthopedics;  Laterality: Right;   SHOULDER ARTHROSCOPY WITH DISTAL CLAVICLE RESECTION Left 11/19/2014   Procedure: SHOULDER ARTHROSCOPY WITH DISTAL CLAVICLE RESECTION;  Surgeon: Kathryne Hitch, MD;  Location: [place] [facility];  Service: Orthopedics;  Laterality: Left;   SHOULDER ARTHROSCOPY WITH ROTATOR CUFF REPAIR AND SUBACROMIAL DECOMPRESSION Left 11/19/2014   Procedure: LEFT SHOULDER ARTHROSCOPY, DEBRIDEMENT DISTAL CLAVICLE EXCISION, ACROMIOPLASTY WITH ROTATOR CUFF REPAIR ;  Surgeon: Kathryne Hitch, MD;  Location: [facility];  Service: Orthopedics;  Laterality: Left;   THROMBECTOMY OF BYPASS GRAFT FEMORAL- POPLITEAL ARTERY Left 05/01/2020   Procedure: THROMBECTOMY OF LOWER EXTREMITY;  Surgeon: Waynetta Sandy, MD;  Location: [facility];  Service: Vascular;  Laterality: Left;   TRANSFORAMINAL LUMBAR INTERBODY FUSION (TLIF) WITH PEDICLE SCREW FIXATION 1 LEVEL N/A 01/03/2018   Procedure: TRANSFORAMINAL LUMBAR INTERBODY FUSION (TLIF) LUMBAR FIVE-SACRAL ONE;  Surgeon: Melina Schools, MD;  Location: [facility];  Service:  Orthopedics;  Laterality: N/A;   ULTRASOUND GUIDANCE FOR VASCULAR ACCESS Right 01/22/2020   Procedure: ULTRASOUND GUIDANCE FOR VASCULAR ACCESS Right Common Femoral Artery.;  Surgeon: Marty Heck, MD;  Location: [facility];  Service: Vascular;  Laterality: Right;   VIDEO ASSISTED THORACOSCOPY (VATS)/DECORTICATION Right 11/21/2011   drainage of empyema    There were no vitals filed for this visit.   Subjective Assessment - 06/13/21 1444     Subjective Pt reports that he had a fall out of bed yesterday when was sleeping. Landed on his right buttocks. Has some skin tears on left wrist and arm and at right wrist. Pt reports that his right hip is just sore.    Patient is accompained by: --   friend, [name] [name]   Pertinent History RA, DM2, HTN, PAD, DDD,  s/p multiple L LE revascularizations discharged from hospital a 05/05/20.  At that time he was deemed to not be a candidate for further revasc procedures.  Dc'd home with a known bypass occlusion with a plan for AKA if worsening pain. Returned to ED 05/08/20  for increased pain and seeking amputation. s/p L AKA 05/11/20    Patient Stated Goals Pt wants to be able to walk better.    Currently in Pain? Yes    Pain Score 6     Pain  Location Hip    Pain Orientation Right    Pain Descriptors / Indicators Aching    Pain Onset More than a month ago    Pain Frequency Intermittent    Aggravating Factors  fall out of bed                               [facility] Adult PT Treatment/Exercise - 06/13/21 1446       Transfers   Transfers Sit to Stand;Stand to Sit    Sit to Stand 5: Supervision    Sit to Stand Details Verbal cues for technique    Sit to Stand Details (indicate cue type and reason) Pt was cued to push from chair of mat versus both hands on walker. Pt unable to get prosthesis locked when first getting up and PT had to assist. Kept locked after that.    Stand to Sit 4: Min guard    Stand to Sit Details (indicate cue type and  reason) Verbal cues for technique      Ambulation/Gait   Ambulation/Gait Yes    Ambulation/Gait Assistance 4: Min guard;4: Min assist    Ambulation/Gait Assistance Details PT provided some cueing to both verbally and tactile to facilitate weight shift over LLE. Also cued to stay up tall in walker and keep head up.    Ambulation Distance (Feet) 115 Feet    Assistive device Rolling walker    Gait Pattern Step-through pattern;Decreased stance time - left;Decreased weight shift to left    Ambulation Surface Level;Indoor      Standardized Balance Assessment   Standardized Balance Assessment Berg Balance Test      Berg Balance Test   Sit to Stand Needs minimal aid to stand or to stabilize    Standing Unsupported Able to stand 30 seconds unsupported    Sitting with Back Unsupported but Feet Supported on Floor or Stool Able to sit safely and securely 2 minutes    Stand to Sit Controls descent by using hands    Transfers Needs one person to assist    Standing Unsupported with Eyes Closed Able to stand 10 seconds with supervision    Standing Ubsupported with Feet Together Needs help to attain position and unable to hold for 15 seconds    From Standing, Reach Forward with Outstretched Arm Can reach forward >5 cm safely (2")    From Standing Position, Pick up Object from Floor Unable to try/needs assist to keep balance    From Standing Position, Turn to Look Behind Over each Shoulder Needs supervision when turning    Turn 360 Degrees Needs assistance while turning    Standing Unsupported, Alternately Place Feet on Step/Stool Able to complete >2 steps/needs minimal assist    Standing Unsupported, One Foot in [employer] balance while stepping or standing    Standing on One Leg Unable to try or needs assist to prevent fall    Total Score 18      Exercises   Exercises Other Exercises;Knee/Hip      Knee/Hip Exercises: Aerobic   Other Aerobic SciFit x 4 min level 2.0 with seat at 16 and arms at 7  with BLE and BUE for strengthening and aerobic tolerance. PT assisted to keep prosthesis in neutral. Pt denied any increase in right hip or knee during.      Prosthetics   Prosthetic Care Comments  Pt reports he is wearing his cut off sock over  top of his 5 ply. PT advised to put the cut off underneath.    Current prosthetic wear tolerance (days/week)  daily    Current prosthetic wear tolerance (#hours/day)  cut back to 2 hours since fall yesterday                     PT Education - 06/13/21 1633     Education Details Pt was instructed not to perform balance tasks we do in therapy on own until instructed. To walk on level surfaces in home, not outside.    Person(s) Educated Patient    Methods Explanation    Comprehension Verbalized understanding              PT Short Term Goals - 06/13/21 1633       PT SHORT TERM GOAL #1   Title Pt will be able to tolerate wearing prosthesis all awake hours for improved function.    Baseline 6 hours/day- has not increased from here yet    Time 4    Period Weeks    Status Not Met    Target Date 07/01/21      PT SHORT TERM GOAL #2   Title Pt will be independent with prosthetic management for improved function.    Baseline currently needs cues    Time 4    Period Weeks    Status Not Met    Target Date 07/01/21      PT SHORT TERM GOAL #3   Title Pt will be able to ambulate 200' with RW initiating prosthetic knee flexion on level surfaces supervision for improved household mobility.    Baseline 48' min assist/CGA with RW and prosthesis locked out    Time 4    Period Weeks    Status Not Met    Target Date 07/01/21      PT SHORT TERM GOAL #4   Title Pt will decrease 5 x sit to stand to </= 18 sec for improved balance and functional mobility.    Baseline 04/27/21 29.56 sec from chair with hands > 22 seconds from mat with hands and RW    Time 4    Period Weeks    Status Revised    Target Date 07/01/21      PT SHORT TERM GOAL  #5   Title Berg Balance will be assessed and LTG written    Baseline 06/13/21 18/56    Time 4    Period Weeks    Status Achieved    Target Date 07/01/21               PT Long Term Goals - 06/13/21 1634       PT LONG TERM GOAL #1   Title Pt will be independent with HEP for strengthening, ROM and balance to continue gains on own.    Baseline no current HEP    Time 12    Period Weeks    Status New    Target Date 07/22/21      PT LONG TERM GOAL #2   Title Pt will increase gait speed to >0.58ms for improved short community distances.    Baseline 04/27/21 0.164m    Time 12    Period Weeks    Status New    Target Date 07/22/21      PT LONG TERM GOAL #3   Title Pt will ambulate >400' with walker on varied level surfaces mod I for short community distances.  Baseline 04/27/21 75' min assit with RW    Time 12    Period Weeks    Status New    Target Date 07/22/21      PT LONG TERM GOAL #4   Title Pt will ambulate up/down 4 steps with rails mod I for improved community access.    Baseline unable    Time 12    Period Weeks    Status New    Target Date 07/22/21      PT LONG TERM GOAL #5   Title Pt will increase Berg score from 18 to >30/56 for improved balance and decreased fall risk.    Baseline 06/13/21 18/56    Time 12    Period Weeks    Status New    Target Date 07/22/21                   Plan - 06/13/21 1634     Clinical Impression Statement PT was able to perform Berg Balance at visit today with prosthesis locked out. Scored 18/56 indicating high fall risk. Pt unable to perform dynamic tasks without  UE support at this time. He required frequent rest breaks due to right hip pain from fall yesterday out of bed.    Personal Factors and Comorbidities Comorbidity 3+;Time since onset of injury/illness/exacerbation    Comorbidities RA, DM2, HTN, PAD, DDD,  s/p multiple L LE revascularizations discharged from hospital a 05/05/20.  At that time he was deemed  to not be a candidate for further revasc procedures.  Dc'd home with a known bypass occlusion with a plan for AKA if worsening pain. Returned to ED 05/08/20  for increased pain and seeking amputation. s/p L AKA 05/11/20. Pt reports 3 shoulder surgeries on each shoulder and low back surgery    Examination-Activity Limitations Bathing;Locomotion Level;Transfers;Stairs;Stand;Squat;Lift    Examination-Participation Restrictions Church;Meal Prep    Stability/Clinical Decision Making Evolving/Moderate complexity    Rehab Potential Good    PT Frequency 2x / week   plus eval   PT Duration 12 weeks    PT Treatment/Interventions ADLs/Self Care Home Management;DME Instruction;Gait training;Stair training;Functional mobility training;Therapeutic activities;Therapeutic exercise;Balance training;Neuromuscular re-education;Manual techniques;Passive range of motion;Prosthetic Training;Patient/family education;Vestibular    PT Next Visit Plan 10th visit progress note. Check how pt has socks donned if prosthetist did put half sock on. Continue standing with posterior pelvis against counter and reaching behind to counter working on upright posture, modified Thomas test stretch, try right hamstring stretch, right TKE in standing;  GAIT WITH PROSTHESIS LOCKED OUT RIGHT NOW as unable to lock on own, work on weight shifting over prosthesis.  Prosthetic education.    Consulted and Agree with Plan of Care Patient;Other (Comment)   friend, [name] [name]            Patient will benefit from skilled therapeutic intervention in order to improve the following deficits and impairments:  Abnormal gait, Decreased mobility, Decreased strength, Impaired flexibility, Decreased range of motion, Decreased knowledge of use of DME, Prosthetic Dependency, Decreased balance, Decreased activity tolerance  Visit Diagnosis: Other abnormalities of gait and mobility  Muscle weakness (generalized)  Unsteadiness on feet     Problem List Patient  Active Problem List   Diagnosis Date Noted   Paronychia of great toe 01/28/2021   Onychomycosis 01/28/2021   Critical limb ischemia with history of revascularization of same extremity ([place]) 04/28/2020   Limb ischemia 01/23/2020   Ischemic leg 01/22/2020   History of COVID-19 05/10/2019   PAD (peripheral  artery disease) ([place])    History of critical lower limb ischemia 02/19/2019   S/P lumbar fusion, Lower back pain 01/03/2018   Coronary artery disease involving native coronary artery of native heart with unstable angina pectoris [facility])    Preventative health care 03/13/2017   Transaminitis 12/16/2015   Bilateral shoulder pain 08/20/2012   Tobacco use disorder 08/20/2012   HTN (hypertension) 04/20/2011   HLD (hyperlipidemia) 10/08/2009   Rheumatoid arthritis ([place]) 08/11/2009    Electa Sniff, PT, DPT, NCS 06/13/2021, 4:37 PM  [facility] [address] [place], [place], [address] Phone: [phone]   Fax:  [phone]  Name: ANGELLO CHIEN MRN: [id number] Date of Birth: March 02, 1961

## 2021-06-16 ENCOUNTER — Ambulatory Visit: Payer: Medicare Other

## 2021-06-21 ENCOUNTER — Ambulatory Visit: Payer: Medicare Other | Admitting: Physical Therapy

## 2021-06-24 ENCOUNTER — Other Ambulatory Visit: Payer: Self-pay

## 2021-06-24 ENCOUNTER — Ambulatory Visit: Payer: Medicare Other

## 2021-06-24 DIAGNOSIS — R2689 Other abnormalities of gait and mobility: Secondary | ICD-10-CM | POA: Diagnosis not present

## 2021-06-24 DIAGNOSIS — R293 Abnormal posture: Secondary | ICD-10-CM

## 2021-06-24 DIAGNOSIS — M6281 Muscle weakness (generalized): Secondary | ICD-10-CM

## 2021-06-24 DIAGNOSIS — R2681 Unsteadiness on feet: Secondary | ICD-10-CM

## 2021-06-24 NOTE — Therapy (Signed)
[facility] [address] [place], [place], [address] Phone: [phone]   Fax:  [phone]  Physical Therapy Treatment/Progress note  Patient Details  Name: [name] [name] MRN: [id number] Date of Birth: 1960-10-14 Referring Provider (PT): left AKA    Progress Note  Reporting period 04/27/21 to 06/24/21  See Note below for Objective Data and Assessment of Progress/Goals  Encounter Date: 06/24/2021   PT End of Session - 06/24/21 1400     Visit Number 10    Number of Visits 25    Date for PT Re-Evaluation 07/22/21    Authorization Type UHC medicare with medicaid secondary so 10th visit progress note    PT Start Time 1358    PT Stop Time 1442    PT Time Calculation (min) 44 min    Equipment Utilized During Treatment Gait belt    Activity Tolerance Patient tolerated treatment well    Behavior During Therapy [facility] for tasks assessed/performed             Past Medical History:  Diagnosis Date   Anemia    Anemia 08/06/2012   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   Hypertension    states is borderline on medication; has been on med. x 5-6 yr.   Hypokalemia 05/30/2018   Impingement syndrome of shoulder region 10/2014   left   Insomnia 06/01/2015   Ischemic ulcer of toe of left foot (HCC)    Left shoulder pain 12/16/2015   Low back pain without sciatica 10/29/2009   Lupus ([place])    Peripheral vascular disease (HCC)    Pneumonia    RA (rheumatoid arthritis) ([place])    Rotator cuff tear 11/04/2014    Past Surgical History:  Procedure Laterality Date   ABDOMINAL AORTAGRAM  02/20/2019   ABDOMINAL AORTOGRAM W/LOWER EXTREMITY N/A 02/20/2019   Procedure: ABDOMINAL AORTOGRAM W/LOWER EXTREMITY;  Surgeon: Marty Heck,  MD;  Location: [facility];  Service: Cardiovascular;  Laterality: N/A;   ACHILLES TENDON SURGERY Bilateral    AMPUTATION Left 05/11/2020   Procedure: LEFT ABOVE KNEE AMPUTATION;  Surgeon: Angelia Mould, MD;  Location: [facility];  Service: Vascular;  Laterality: Left;   ENDARTERECTOMY POPLITEAL Left 05/01/2020   Procedure: ENDARTERECTOMY POPLITEAL;  Surgeon: Waynetta Sandy, MD;  Location: [facility];  Service: Vascular;  Laterality: Left;   FASCIOTOMY Left 05/01/2020   Procedure: FASCIOTOMY;  Surgeon: Waynetta Sandy, MD;  Location: [facility];  Service: Vascular;  Laterality: Left;   FEMORAL-POPLITEAL BYPASS GRAFT Left 02/26/2019   Procedure: BYPASS GRAFT FEMORAL-POPLITEAL ARTERY LEFT LEG USING 34m PROPATEN GRAFT;  Surgeon: BSerafina Mitchell MD;  [place] [facility]  Service: Vascular;  Laterality: Left;   INTRAOPERATIVE ARTERIOGRAM Left 01/22/2020   Procedure: INTRA OPERATIVE ARTERIOGRAM with administration of thrombolyics in Left femoral to popliteal bypass.;  Surgeon: CMarty Heck MD;  [place]  Service: Vascular;  Laterality: Left;   LEFT HEART CATH AND CORONARY ANGIOGRAPHY N/A 03/15/2017   Procedure: LEFT HEART CATH AND CORONARY ANGIOGRAPHY;  Surgeon: ENelva Bush MD;  [facility];  Service: Cardiovascular;  Laterality: N/A;   LOWER EXTREMITY INTERVENTION Left 04/29/2020   Procedure: LOWER EXTREMITY INTERVENTION- LYSIS;  Surgeon: CMonica Martinez  J, MD;  Location: [facility];  Service: Cardiovascular;  Laterality: Left;   OSTEOTOMY AND ULNAR SHORTENING Right 07/22/2002   PATCH ANGIOPLASTY Left 05/01/2020   Procedure: PATCH ANGIOPLASTY USING HEMASHIELD PLATINUM FINESSE PATCH;  Surgeon: Waynetta Sandy, MD;  Location: [facility];  Service: Vascular;  Laterality: Left;   PERIPHERAL VASCULAR ATHERECTOMY  01/23/2020   Procedure: PERIPHERAL VASCULAR ATHERECTOMY;  Surgeon: Marty Heck, MD;  Location: [facility];   Service: Cardiovascular;;   PERIPHERAL VASCULAR BALLOON ANGIOPLASTY  01/23/2020   Procedure: PERIPHERAL VASCULAR BALLOON ANGIOPLASTY;  Surgeon: Marty Heck, MD;  Location: [facility];  Service: Cardiovascular;;   PERIPHERAL VASCULAR INTERVENTION Left 04/30/2020   Procedure: PERIPHERAL VASCULAR INTERVENTION;  Surgeon: Cherre Robins, MD;  Location: [facility];  Service: Cardiovascular;  Laterality: Left;   PERIPHERAL VASCULAR THROMBECTOMY N/A 01/23/2020   Procedure: lysis recheck;  Surgeon: Marty Heck, MD;  Location: [facility];  Service: Cardiovascular;  Laterality: N/A;  + [place]    PERIPHERAL VASCULAR THROMBECTOMY N/A 04/30/2020   Procedure: Lysis Recheck;  Surgeon: Cherre Robins, MD;  Location: [facility];  Service: Cardiovascular;  Laterality: N/A;   SHOULDER ARTHROSCOPY WITH BICEPSTENOTOMY Right 03/11/2015   Procedure: SHOULDER ARTHROSCOPY WITH BICEPSTENOTOMY;  Surgeon: Ninetta Lights, MD;  Location: [facility];  Service: Orthopedics;  Laterality: Right;   SHOULDER ARTHROSCOPY WITH DISTAL CLAVICLE RESECTION Left 11/19/2014   Procedure: SHOULDER ARTHROSCOPY WITH DISTAL CLAVICLE RESECTION;  Surgeon: Kathryne Hitch, MD;  Location: [facility];  Service: Orthopedics;  Laterality: Left;   SHOULDER ARTHROSCOPY WITH ROTATOR CUFF REPAIR AND SUBACROMIAL DECOMPRESSION Left 11/19/2014   Procedure: LEFT SHOULDER ARTHROSCOPY, DEBRIDEMENT DISTAL CLAVICLE EXCISION, ACROMIOPLASTY WITH ROTATOR CUFF REPAIR ;  Surgeon: Kathryne Hitch, MD;  Location: [facility];  Service: Orthopedics;  Laterality: Left;   THROMBECTOMY OF BYPASS GRAFT FEMORAL- POPLITEAL ARTERY Left 05/01/2020   Procedure: THROMBECTOMY OF LOWER EXTREMITY;  Surgeon: Waynetta Sandy, MD;  Location: [facility];  Service: Vascular;  Laterality: Left;   TRANSFORAMINAL LUMBAR INTERBODY FUSION (TLIF) WITH PEDICLE SCREW FIXATION 1 LEVEL N/A 01/03/2018    Procedure: TRANSFORAMINAL LUMBAR INTERBODY FUSION (TLIF) LUMBAR FIVE-SACRAL ONE;  Surgeon: Melina Schools, MD;  Location: [facility];  Service: Orthopedics;  Laterality: N/A;   ULTRASOUND GUIDANCE FOR VASCULAR ACCESS Right 01/22/2020   Procedure: ULTRASOUND GUIDANCE FOR VASCULAR ACCESS Right Common Femoral Artery.;  Surgeon: Marty Heck, MD;  Location: [facility];  Service: Vascular;  Laterality: Right;   VIDEO ASSISTED THORACOSCOPY (VATS)/DECORTICATION Right 11/21/2011   drainage of empyema    There were no vitals filed for this visit.   Subjective Assessment - 06/24/21 1401     Subjective Pt reports that his right arm is bothering him more just below elbow down to hand with numbness and stinging. Pt sees the orthopedic doctor 1/5 about his hand.    Patient is accompained by: --   friend, Don   Pertinent History RA, DM2, HTN, PAD, DDD,  s/p multiple L LE revascularizations discharged from hospital a 05/05/20.  At that time he was deemed to not be a candidate for further revasc procedures.  Dc'd home with a known bypass occlusion with a plan for AKA if worsening pain. Returned to ED 05/08/20  for increased pain and seeking amputation. s/p L AKA 05/11/20    Patient Stated Goals Pt wants to be able to walk better.    Currently in Pain?  Yes    Pain Location Arm    Pain Orientation Right    Pain Descriptors / Indicators Numbness;Burning    Pain Type Chronic pain    Pain Onset More than a month ago    Pain Frequency Constant                               [facility] Adult PT Treatment/Exercise - 06/24/21 1404       Transfers   Transfers Sit to Stand;Stand to Sit    Sit to Stand 5: Supervision    Sit to Stand Details Verbal cues for technique    Sit to Stand Details (indicate cue type and reason) Locked prosthesis once standing initially then kept locked out.    Stand to Sit 5: Supervision    Stand to Sit Details (indicate cue type and reason) Verbal cues for technique     Stand to Sit Details Cued to kick prosthesis foot out some prior to sitting      Ambulation/Gait   Ambulation/Gait Yes    Ambulation/Gait Assistance 4: Min guard    Ambulation/Gait Assistance Details Tactile and verbal cues to weight shift over prosthesis and keep head up for erect posture. As pt went on prosthesis started to internally rotate. Was catching prosthesis during swing at times. Stopped to readjust before continuing. W/c follow. Pt limited by pain in right arm as went on as well. Prosthesis locked out with gait.    Ambulation Distance (Feet) 115 Feet   75' x 1   Assistive device Rolling walker;Prosthesis    Gait Pattern Step-through pattern;Decreased step length - right;Decreased stance time - left;Decreased weight shift to left    Ambulation Surface Level;Indoor      Neuro Re-ed    Neuro Re-ed Details  At counter: standing working on weight shifting with upright posture moving 3 cones across body to side x 2, moving 3 cones to overhead cabinet with RUE x 2, Side stepping along counter stacking cones during movement. Gait forwards and backwards along counter 8' x 2 with RUE support min assist and verbal/tactile cues for upright posture. Standing without UE support with upright posture x 20 sec CGA. Pt needed seated rest breaks between activities. PT also had to assist to readjust prosthesis as kept internally rotating.      Prosthetics   Prosthetic Care Comments  Currently wearing a 5 ply sock. Did not bring his cut off sock. Advised to bring next time as may help with prosthesis sliding so much.    Current prosthetic wear tolerance (days/week)  daily    Current prosthetic wear tolerance (#hours/day)  wearing liner 3-4 hours a day and full leg 2 hours.    Education Provided Correct ply sock adjustment;Proper Donning    Person(s) Educated Patient    Education Method Explanation;Demonstration    Education Method Verbalized understanding;Needs further instruction    Donning Prosthesis  Minimal assist                       PT Short Term Goals - 06/24/21 1905       PT SHORT TERM GOAL #1   Title Pt will be able to tolerate wearing prosthesis all awake hours for improved function.    Baseline 06/24/21 only wearing 3-4 hours/day    Time 4    Period Weeks    Status Not Met    Target Date 07/01/21  PT SHORT TERM GOAL #2   Title Pt will be independent with prosthetic management for improved function.    Baseline 06/24/21 currently needs assistance with sock ply adjustment    Time 4    Period Weeks    Status Not Met    Target Date 07/01/21      PT SHORT TERM GOAL #3   Title Pt will be able to ambulate 200' with RW initiating prosthetic knee flexion on level surfaces supervision for improved household mobility.    Baseline 48' min assist/CGA with RW and prosthesis locked out. 06/24/21 115' with RW with prosthesis locked out CGA.    Time 4    Period Weeks    Status On-going    Target Date 07/01/21      PT SHORT TERM GOAL #4   Title Pt will decrease 5 x sit to stand to </= 18 sec for improved balance and functional mobility.    Baseline 04/27/21 29.56 sec from chair with hands > 22 seconds from mat with hands and RW    Time 4    Period Weeks    Status Revised    Target Date 07/01/21      PT SHORT TERM GOAL #5   Title Berg Balance will be assessed and LTG written    Baseline 06/13/21 18/56    Time 4    Period Weeks    Status Achieved    Target Date 07/01/21               PT Long Term Goals - 06/13/21 1634       PT LONG TERM GOAL #1   Title Pt will be independent with HEP for strengthening, ROM and balance to continue gains on own.    Baseline no current HEP    Time 12    Period Weeks    Status New    Target Date 07/22/21      PT LONG TERM GOAL #2   Title Pt will increase gait speed to >0.60m/s for improved short community distances.    Baseline 04/27/21 0.71m/s    Time 12    Period Weeks    Status New    Target Date 07/22/21       PT LONG TERM GOAL #3   Title Pt will ambulate >400' with walker on varied level surfaces mod I for short community distances.    Baseline 04/27/21 75' min assit with RW    Time 12    Period Weeks    Status New    Target Date 07/22/21      PT LONG TERM GOAL #4   Title Pt will ambulate up/down 4 steps with rails mod I for improved community access.    Baseline unable    Time 12    Period Weeks    Status New    Target Date 07/22/21      PT LONG TERM GOAL #5   Title Pt will increase Berg score from 18 to >30/56 for improved balance and decreased fall risk.    Baseline 06/13/21 18/56    Time 12    Period Weeks    Status New    Target Date 07/22/21                   Plan - 06/24/21 1907     Clinical Impression Statement Pt continues to show improvements in posture during gait and in standing. He is able to weight shift better over prosthesis.  Continues to ambulate with prosthesis locked out for safety. He is having issues with prosthesis rotating internally and have continued to provide education on proper sock adjustment to help. Pt reporting worsening pain in right arm/hand with numbness and will be seeing his ortho next Thursday to address this. Pt continues to benefit from skilled PT to continue to progress towards goals.    Personal Factors and Comorbidities Comorbidity 3+;Time since onset of injury/illness/exacerbation    Comorbidities RA, DM2, HTN, PAD, DDD,  s/p multiple L LE revascularizations discharged from hospital a 05/05/20.  At that time he was deemed to not be a candidate for further revasc procedures.  Dc'd home with a known bypass occlusion with a plan for AKA if worsening pain. Returned to ED 05/08/20  for increased pain and seeking amputation. s/p L AKA 05/11/20. Pt reports 3 shoulder surgeries on each shoulder and low back surgery    Examination-Activity Limitations Bathing;Locomotion Level;Transfers;Stairs;Stand;Squat;Lift    Examination-Participation  Restrictions Church;Meal Prep    Stability/Clinical Decision Making Evolving/Moderate complexity    Rehab Potential Good    PT Frequency 2x / week   plus eval   PT Duration 12 weeks    PT Treatment/Interventions ADLs/Self Care Home Management;DME Instruction;Gait training;Stair training;Functional mobility training;Therapeutic activities;Therapeutic exercise;Balance training;Neuromuscular re-education;Manual techniques;Passive range of motion;Prosthetic Training;Patient/family education;Vestibular    PT Next Visit Plan Continue proper sock adjustment education. Half sock to provide more secure fit distally. Continue standing balance and working on upright posture.  GAIT WITH PROSTHESIS LOCKED OUT RIGHT NOW as unable to lock on own, work on weight shifting over prosthesis.  Prosthetic education.    Consulted and Agree with Plan of Care Patient;Other (Comment)   friend, Timmothy Sours            Patient will benefit from skilled therapeutic intervention in order to improve the following deficits and impairments:  Abnormal gait, Decreased mobility, Decreased strength, Impaired flexibility, Decreased range of motion, Decreased knowledge of use of DME, Prosthetic Dependency, Decreased balance, Decreased activity tolerance  Visit Diagnosis: Other abnormalities of gait and mobility  Muscle weakness (generalized)  Abnormal posture  Unsteadiness on feet     Problem List Patient Active Problem List   Diagnosis Date Noted   Paronychia of great toe 01/28/2021   Onychomycosis 01/28/2021   Critical limb ischemia with history of revascularization of same extremity ([place]) 04/28/2020   Limb ischemia 01/23/2020   Ischemic leg 01/22/2020   History of COVID-19 05/10/2019   PAD (peripheral artery disease) ([place])    History of critical lower limb ischemia 02/19/2019   S/P lumbar fusion, Lower back pain 01/03/2018   Coronary artery disease involving native coronary artery of native heart with unstable angina  pectoris [facility])    Preventative health care 03/13/2017   Transaminitis 12/16/2015   Bilateral shoulder pain 08/20/2012   Tobacco use disorder 08/20/2012   HTN (hypertension) 04/20/2011   HLD (hyperlipidemia) 10/08/2009   Rheumatoid arthritis ([place]) 08/11/2009    Electa Sniff, PT, DPT, NCS 06/24/2021, 7:18 PM  [facility] [address] [place], [place], [address] Phone: [phone]   Fax:  [phone]  Name: [name] [name] MRN: [id number] Date of Birth: 05-31-1961

## 2021-06-28 ENCOUNTER — Ambulatory Visit: Payer: Medicare Other | Admitting: Physical Therapy

## 2021-06-30 DIAGNOSIS — M79641 Pain in right hand: Secondary | ICD-10-CM | POA: Diagnosis not present

## 2021-06-30 DIAGNOSIS — M25521 Pain in right elbow: Secondary | ICD-10-CM | POA: Diagnosis not present

## 2021-06-30 DIAGNOSIS — M06841 Other specified rheumatoid arthritis, right hand: Secondary | ICD-10-CM | POA: Diagnosis not present

## 2021-06-30 DIAGNOSIS — S5001XA Contusion of right elbow, initial encounter: Secondary | ICD-10-CM | POA: Diagnosis not present

## 2021-07-01 ENCOUNTER — Other Ambulatory Visit: Payer: Self-pay

## 2021-07-01 ENCOUNTER — Ambulatory Visit: Payer: Medicare Other | Attending: Vascular Surgery

## 2021-07-01 DIAGNOSIS — M6281 Muscle weakness (generalized): Secondary | ICD-10-CM

## 2021-07-01 DIAGNOSIS — R2681 Unsteadiness on feet: Secondary | ICD-10-CM | POA: Diagnosis not present

## 2021-07-01 DIAGNOSIS — R2689 Other abnormalities of gait and mobility: Secondary | ICD-10-CM

## 2021-07-01 DIAGNOSIS — R293 Abnormal posture: Secondary | ICD-10-CM | POA: Insufficient documentation

## 2021-07-01 NOTE — Therapy (Signed)
[facility] [address] [place], [place], [address] Phone: [phone]   Fax:  [phone]  Physical Therapy Treatment  Patient Details  Name: HECTOR TAFT MRN: [id number] Date of Birth: 01/06/1961 Referring Provider (PT): left AKA   Encounter Date: 07/01/2021   PT End of Session - 07/01/21 1447     Visit Number 11    Number of Visits 25    Date for PT Re-Evaluation 07/22/21    Authorization Type UHC medicare with medicaid secondary so 10th visit progress note    PT Start Time 1445    PT Stop Time 1530    PT Time Calculation (min) 45 min    Equipment Utilized During Treatment Gait belt    Activity Tolerance Patient tolerated treatment well    Behavior During Therapy [facility] for tasks assessed/performed             Past Medical History:  Diagnosis Date   Anemia    Anemia 08/06/2012   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   Hypertension    states is borderline on medication; has been on med. x 5-6 yr.   Hypokalemia 05/30/2018   Impingement syndrome of shoulder region 10/2014   left   Insomnia 06/01/2015   Ischemic ulcer of toe of left foot (HCC)    Left shoulder pain 12/16/2015   Low back pain without sciatica 10/29/2009   Lupus ([place])    Peripheral vascular disease (HCC)    Pneumonia    RA (rheumatoid arthritis) ([place])    Rotator cuff tear 11/04/2014    Past Surgical History:  Procedure Laterality Date   ABDOMINAL AORTAGRAM  02/20/2019   ABDOMINAL AORTOGRAM W/LOWER EXTREMITY N/A 02/20/2019   Procedure: ABDOMINAL AORTOGRAM W/LOWER EXTREMITY;  Surgeon: Marty Heck, MD;  Location: [facility];  Service: Cardiovascular;  Laterality: N/A;   ACHILLES TENDON SURGERY Bilateral    AMPUTATION Left  05/11/2020   Procedure: LEFT ABOVE KNEE AMPUTATION;  Surgeon: Angelia Mould, MD;  Location: [facility];  Service: Vascular;  Laterality: Left;   ENDARTERECTOMY POPLITEAL Left 05/01/2020   Procedure: ENDARTERECTOMY POPLITEAL;  Surgeon: Waynetta Sandy, MD;  Location: [facility];  Service: Vascular;  Laterality: Left;   FASCIOTOMY Left 05/01/2020   Procedure: FASCIOTOMY;  Surgeon: Waynetta Sandy, MD;  Location: [facility];  Service: Vascular;  Laterality: Left;   FEMORAL-POPLITEAL BYPASS GRAFT Left 02/26/2019   Procedure: BYPASS GRAFT FEMORAL-POPLITEAL ARTERY LEFT LEG USING 65m PROPATEN GRAFT;  Surgeon: BSerafina Mitchell MD;  [facility] [facility]  Service: Vascular;  Laterality: Left;   INTRAOPERATIVE ARTERIOGRAM Left 01/22/2020   Procedure: INTRA OPERATIVE ARTERIOGRAM with administration of thrombolyics in Left femoral to popliteal bypass.;  Surgeon: CMarty Heck MD;  [facility]  Service: Vascular;  Laterality: Left;   LEFT HEART CATH AND CORONARY ANGIOGRAPHY N/A 03/15/2017   Procedure: LEFT HEART CATH AND CORONARY ANGIOGRAPHY;  Surgeon: ENelva Bush MD;  [facility];  Service: Cardiovascular;  Laterality: N/A;   LOWER EXTREMITY INTERVENTION Left 04/29/2020   Procedure: LOWER EXTREMITY INTERVENTION- LYSIS;  Surgeon: CMarty Heck MD;  [facility];  Service: Cardiovascular;  Laterality: Left;   OSTEOTOMY AND ULNAR SHORTENING Right 07/22/2002  PATCH ANGIOPLASTY Left 05/01/2020   Procedure: [place];  Surgeon: Waynetta Sandy, MD;  Location: [facility];  Service: Vascular;  Laterality: Left;   PERIPHERAL VASCULAR ATHERECTOMY  01/23/2020   Procedure: PERIPHERAL VASCULAR ATHERECTOMY;  Surgeon: Marty Heck, MD;  Location: [facility];  Service: Cardiovascular;;   PERIPHERAL VASCULAR BALLOON ANGIOPLASTY  01/23/2020   Procedure: PERIPHERAL VASCULAR BALLOON ANGIOPLASTY;   Surgeon: Marty Heck, MD;  Location: [facility];  Service: Cardiovascular;;   PERIPHERAL VASCULAR INTERVENTION Left 04/30/2020   Procedure: PERIPHERAL VASCULAR INTERVENTION;  Surgeon: Cherre Robins, MD;  Location: [facility];  Service: Cardiovascular;  Laterality: Left;   PERIPHERAL VASCULAR THROMBECTOMY N/A 01/23/2020   Procedure: lysis recheck;  Surgeon: Marty Heck, MD;  Location: [facility];  Service: Cardiovascular;  Laterality: N/A;  + [place]    PERIPHERAL VASCULAR THROMBECTOMY N/A 04/30/2020   Procedure: Lysis Recheck;  Surgeon: Cherre Robins, MD;  Location: [facility];  Service: Cardiovascular;  Laterality: N/A;   SHOULDER ARTHROSCOPY WITH BICEPSTENOTOMY Right 03/11/2015   Procedure: SHOULDER ARTHROSCOPY WITH BICEPSTENOTOMY;  Surgeon: Ninetta Lights, MD;  Location: [facility];  Service: Orthopedics;  Laterality: Right;   SHOULDER ARTHROSCOPY WITH DISTAL CLAVICLE RESECTION Left 11/19/2014   Procedure: SHOULDER ARTHROSCOPY WITH DISTAL CLAVICLE RESECTION;  Surgeon: Kathryne Hitch, MD;  Location: [facility];  Service: Orthopedics;  Laterality: Left;   SHOULDER ARTHROSCOPY WITH ROTATOR CUFF REPAIR AND SUBACROMIAL DECOMPRESSION Left 11/19/2014   Procedure: LEFT SHOULDER ARTHROSCOPY, DEBRIDEMENT DISTAL CLAVICLE EXCISION, ACROMIOPLASTY WITH ROTATOR CUFF REPAIR ;  Surgeon: Kathryne Hitch, MD;  Location: [facility];  Service: Orthopedics;  Laterality: Left;   THROMBECTOMY OF BYPASS GRAFT FEMORAL- POPLITEAL ARTERY Left 05/01/2020   Procedure: THROMBECTOMY OF LOWER EXTREMITY;  Surgeon: Waynetta Sandy, MD;  Location: [facility];  Service: Vascular;  Laterality: Left;   TRANSFORAMINAL LUMBAR INTERBODY FUSION (TLIF) WITH PEDICLE SCREW FIXATION 1 LEVEL N/A 01/03/2018   Procedure: TRANSFORAMINAL LUMBAR INTERBODY FUSION (TLIF) LUMBAR FIVE-SACRAL ONE;  Surgeon: Melina Schools, MD;  Location: [facility];  Service:  Orthopedics;  Laterality: N/A;   ULTRASOUND GUIDANCE FOR VASCULAR ACCESS Right 01/22/2020   Procedure: ULTRASOUND GUIDANCE FOR VASCULAR ACCESS Right Common Femoral Artery.;  Surgeon: Marty Heck, MD;  Location: [facility];  Service: Vascular;  Laterality: Right;   VIDEO ASSISTED THORACOSCOPY (VATS)/DECORTICATION Right 11/21/2011   drainage of empyema    There were no vitals filed for this visit.   Subjective Assessment - 07/01/21 1447     Subjective Pt reports that the ortho said that he probably has a bruised nerve in arm. They want him to return in 4 weeks 2/9 for a nerve conduction study to be ordered if not calmed down.    Patient is accompained by: --   friend, Don   Pertinent History RA, DM2, HTN, PAD, DDD,  s/p multiple L LE revascularizations discharged from hospital a 05/05/20.  At that time he was deemed to not be a candidate for further revasc procedures.  Dc'd home with a known bypass occlusion with a plan for AKA if worsening pain. Returned to ED 05/08/20  for increased pain and seeking amputation. s/p L AKA 05/11/20    Patient Stated Goals Pt wants to be able to walk better.    Currently in Pain? Yes    Pain Score 7     Pain Location Arm  Pain Orientation Right    Pain Descriptors / Indicators Burning;Numbness    Pain Type Chronic pain    Pain Onset More than a month ago                               [facility] Adult PT Treatment/Exercise - 07/01/21 1450       Transfers   Transfers Sit to Stand;Stand to Sit    Sit to Stand 5: Supervision    Sit to Stand Details (indicate cue type and reason) Pt was able to lock his prosthesis once in standing.    Stand to Sit 5: Supervision    Stand to Sit Details (indicate cue type and reason) Verbal cues for technique    Stand to Sit Details Verbal cues to kick out prosthesis prior to sitting since locked out.      Ambulation/Gait   Ambulation/Gait Yes    Ambulation/Gait Assistance 5: Supervision;4: Min guard     Ambulation/Gait Assistance Details Initial walk pt started to rotate internally at prosthesis. Stopped to adjust and then added 1/4" heel lift in right shoe to allow better clearance of prosthesis to try to help decrease IR. Improved some. PT also provided assistance at top of prosthesis to try to prevent turning. Last bout was after PT had completely removed prosthesis, patted skin dry with mild sweating and assisted pt to get liner on with no gap. Applied cut off 3 ply sock at base, then 1 ply was added from out clinic and then patient's 5 ply for 9 ply total. Last bout pt reported feeling tighter in prosthesis with less severe rotation noted. Advised pt to be sure to bring extra socks next time to adjust for this. Verbal and tactile cues for upright posture and to weight shift over prosthesis. W/c follow from pt's friend.    Ambulation Distance (Feet) --   60', 60', 130', 115'   Assistive device Rolling walker;Prosthesis    Gait Pattern Step-through pattern;Decreased stance time - left;Decreased weight shift to left    Ambulation Surface Level;Indoor    Gait Comments Gait with prosthesis locked out      Prosthetics   Prosthetic Care Comments  Pt reports that he has his cut sock on under his 5 ply for 8 ply total. PT found that cut sock was on top of 5 ply. Educated to put under other sock to prevent from rolling. PT added another 1 ply sock from clinic to try to prevent prosthesis from sliding around. Educated on being sure to fully invert liner before donning to prevent any gap.    Current prosthetic wear tolerance (days/week)  daily    Current prosthetic wear tolerance (#hours/day)  2 hours/day    Education Provided Proper Donning;Correct ply sock adjustment    Person(s) Educated Patient    Education Method Explanation;Demonstration    Education Method Verbalized understanding;Needs further instruction    Donning Prosthesis Minimal assist    Doffing Prosthesis Supervision                      PT Education - 07/01/21 1542     Education Details prosthetic education    Person(s) Educated Patient    Methods Explanation    Comprehension Verbalized understanding              PT Short Term Goals - 06/24/21 1905       PT SHORT TERM GOAL #1  Title Pt will be able to tolerate wearing prosthesis all awake hours for improved function.    Baseline 06/24/21 only wearing 3-4 hours/day    Time 4    Period Weeks    Status Not Met    Target Date 07/01/21      PT SHORT TERM GOAL #2   Title Pt will be independent with prosthetic management for improved function.    Baseline 06/24/21 currently needs assistance with sock ply adjustment    Time 4    Period Weeks    Status Not Met    Target Date 07/01/21      PT SHORT TERM GOAL #3   Title Pt will be able to ambulate 200' with RW initiating prosthetic knee flexion on level surfaces supervision for improved household mobility.    Baseline 48' min assist/CGA with RW and prosthesis locked out. 06/24/21 115' with RW with prosthesis locked out CGA.    Time 4    Period Weeks    Status On-going    Target Date 07/01/21      PT SHORT TERM GOAL #4   Title Pt will decrease 5 x sit to stand to </= 18 sec for improved balance and functional mobility.    Baseline 04/27/21 29.56 sec from chair with hands > 22 seconds from mat with hands and RW    Time 4    Period Weeks    Status Revised    Target Date 07/01/21      PT SHORT TERM GOAL #5   Title Berg Balance will be assessed and LTG written    Baseline 06/13/21 18/56    Time 4    Period Weeks    Status Achieved    Target Date 07/01/21               PT Long Term Goals - 06/13/21 1634       PT LONG TERM GOAL #1   Title Pt will be independent with HEP for strengthening, ROM and balance to continue gains on own.    Baseline no current HEP    Time 12    Period Weeks    Status New    Target Date 07/22/21      PT LONG TERM GOAL #2   Title Pt will increase  gait speed to >0.20ms for improved short community distances.    Baseline 04/27/21 0.175m    Time 12    Period Weeks    Status New    Target Date 07/22/21      PT LONG TERM GOAL #3   Title Pt will ambulate >400' with walker on varied level surfaces mod I for short community distances.    Baseline 04/27/21 75' min assit with RW    Time 12    Period Weeks    Status New    Target Date 07/22/21      PT LONG TERM GOAL #4   Title Pt will ambulate up/down 4 steps with rails mod I for improved community access.    Baseline unable    Time 12    Period Weeks    Status New    Target Date 07/22/21      PT LONG TERM GOAL #5   Title Pt will increase Berg score from 18 to >30/56 for improved balance and decreased fall risk.    Baseline 06/13/21 18/56    Time 12    Period Weeks    Status New    Target Date  07/22/21                   Plan - 07/01/21 1544     Clinical Impression Statement Pt was able to increase gait distance and bouts today. Continues to have issues with prosthesis internally rotating. Improved some with adding 1/4" heel lift in nonprosthetic shoe and adding another sock.    Personal Factors and Comorbidities Comorbidity 3+;Time since onset of injury/illness/exacerbation    Comorbidities RA, DM2, HTN, PAD, DDD,  s/p multiple L LE revascularizations discharged from hospital a 05/05/20.  At that time he was deemed to not be a candidate for further revasc procedures.  Dc'd home with a known bypass occlusion with a plan for AKA if worsening pain. Returned to ED 05/08/20  for increased pain and seeking amputation. s/p L AKA 05/11/20. Pt reports 3 shoulder surgeries on each shoulder and low back surgery    Examination-Activity Limitations Bathing;Locomotion Level;Transfers;Stairs;Stand;Squat;Lift    Examination-Participation Restrictions Church;Meal Prep    Stability/Clinical Decision Making Evolving/Moderate complexity    Rehab Potential Good    PT Frequency 2x / week    plus eval   PT Duration 12 weeks    PT Treatment/Interventions ADLs/Self Care Home Management;DME Instruction;Gait training;Stair training;Functional mobility training;Therapeutic activities;Therapeutic exercise;Balance training;Neuromuscular re-education;Manual techniques;Passive range of motion;Prosthetic Training;Patient/family education;Vestibular    PT Next Visit Plan Check 5 x sit to stand STG. Continue proper sock adjustment education. Half sock to provide more secure fit distally. Continue standing balance and working on upright posture.  GAIT WITH PROSTHESIS LOCKED OUT RIGHT NOW as unable to lock on own, work on weight shifting over prosthesis.  Prosthetic education.    Consulted and Agree with Plan of Care Patient;Other (Comment)   friend, Timmothy Sours            Patient will benefit from skilled therapeutic intervention in order to improve the following deficits and impairments:  Abnormal gait, Decreased mobility, Decreased strength, Impaired flexibility, Decreased range of motion, Decreased knowledge of use of DME, Prosthetic Dependency, Decreased balance, Decreased activity tolerance  Visit Diagnosis: Other abnormalities of gait and mobility  Muscle weakness (generalized)  Unsteadiness on feet     Problem List Patient Active Problem List   Diagnosis Date Noted   Paronychia of great toe 01/28/2021   Onychomycosis 01/28/2021   Critical limb ischemia with history of revascularization of same extremity ([place]) 04/28/2020   Limb ischemia 01/23/2020   Ischemic leg 01/22/2020   History of COVID-19 05/10/2019   PAD (peripheral artery disease) ([place])    History of critical lower limb ischemia 02/19/2019   S/P lumbar fusion, Lower back pain 01/03/2018   Coronary artery disease involving native coronary artery of native heart with unstable angina pectoris [facility])    Preventative health care 03/13/2017   Transaminitis 12/16/2015   Bilateral shoulder pain 08/20/2012   Tobacco use disorder  08/20/2012   HTN (hypertension) 04/20/2011   HLD (hyperlipidemia) 10/08/2009   Rheumatoid arthritis ([place]) 08/11/2009    Electa Sniff, PT, DPT, NCS 07/01/2021, 3:46 PM  [facility] [address] [place], [place], [address] Phone: [phone]   Fax:  [phone]  Name: CECILIO OHLRICH MRN: [id number] Date of Birth: Sep 08, 1960

## 2021-07-05 ENCOUNTER — Ambulatory Visit: Payer: Medicare Other | Admitting: Physical Therapy

## 2021-07-06 DIAGNOSIS — M5136 Other intervertebral disc degeneration, lumbar region: Secondary | ICD-10-CM | POA: Diagnosis not present

## 2021-07-06 DIAGNOSIS — G894 Chronic pain syndrome: Secondary | ICD-10-CM | POA: Diagnosis not present

## 2021-07-08 ENCOUNTER — Ambulatory Visit: Payer: Medicare Other | Admitting: Physical Therapy

## 2021-07-08 ENCOUNTER — Encounter: Payer: Self-pay | Admitting: Physical Therapy

## 2021-07-08 ENCOUNTER — Other Ambulatory Visit: Payer: Self-pay

## 2021-07-08 DIAGNOSIS — R2689 Other abnormalities of gait and mobility: Secondary | ICD-10-CM | POA: Diagnosis not present

## 2021-07-08 DIAGNOSIS — M6281 Muscle weakness (generalized): Secondary | ICD-10-CM | POA: Diagnosis not present

## 2021-07-08 DIAGNOSIS — R2681 Unsteadiness on feet: Secondary | ICD-10-CM

## 2021-07-08 DIAGNOSIS — R293 Abnormal posture: Secondary | ICD-10-CM

## 2021-07-08 NOTE — Therapy (Signed)
[facility] [address] [place], [place], [address] Phone: [phone]   Fax:  [phone]  Physical Therapy Treatment  Patient Details  Name: [name] [name] MRN: [id number] Date of Birth: [birth year] Referring Provider (PT): left AKA   Encounter Date: 07/08/2021   PT End of Session - 07/08/21 1457     Visit Number 12    Number of Visits 25    Date for PT Re-Evaluation 07/22/21    Authorization Type UHC medicare with medicaid secondary so 10th visit progress note    PT Start Time 1450    PT Stop Time 1530    PT Time Calculation (min) 40 min    Equipment Utilized During Treatment Gait belt    Activity Tolerance Patient tolerated treatment well;Patient limited by pain    Behavior During Therapy [facility] for tasks assessed/performed             Past Medical History:  Diagnosis Date   Anemia    Anemia 08/06/2012   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   Hypertension    states is borderline on medication; has been on med. x 5-6 yr.   Hypokalemia 05/30/2018   Impingement syndrome of shoulder region 10/2014   left   Insomnia 06/01/2015   Ischemic ulcer of toe of left foot (HCC)    Left shoulder pain 12/16/2015   Low back pain without sciatica 10/29/2009   Lupus ([place])    Peripheral vascular disease (HCC)    Pneumonia    RA (rheumatoid arthritis) ([place])    Rotator cuff tear 11/04/2014    Past Surgical History:  Procedure Laterality Date   ABDOMINAL AORTAGRAM  02/20/2019   ABDOMINAL AORTOGRAM W/LOWER EXTREMITY N/A 02/20/2019   Procedure: ABDOMINAL AORTOGRAM W/LOWER EXTREMITY;  Surgeon: Marty Heck, MD;  Location: [place] [facility];  Service: Cardiovascular;  Laterality: N/A;   ACHILLES TENDON SURGERY Bilateral     AMPUTATION Left 05/11/2020   Procedure: LEFT ABOVE KNEE AMPUTATION;  Surgeon: Angelia Mould, MD;  Location: [place];  Service: Vascular;  Laterality: Left;   ENDARTERECTOMY POPLITEAL Left 05/01/2020   Procedure: ENDARTERECTOMY POPLITEAL;  Surgeon: Waynetta Sandy, MD;  Location: [place];  Service: Vascular;  Laterality: Left;   FASCIOTOMY Left 05/01/2020   Procedure: FASCIOTOMY;  Surgeon: Waynetta Sandy, MD;  Location: [facility];  Service: Vascular;  Laterality: Left;   FEMORAL-POPLITEAL BYPASS GRAFT Left 02/26/2019   Procedure: BYPASS GRAFT FEMORAL-POPLITEAL ARTERY LEFT LEG USING 53m PROPATEN GRAFT;  Surgeon: BSerafina Mitchell MD;  [place]  Service: Vascular;  Laterality: Left;   INTRAOPERATIVE ARTERIOGRAM Left 01/22/2020   Procedure: INTRA OPERATIVE ARTERIOGRAM with administration of thrombolyics in Left femoral to popliteal bypass.;  Surgeon: CMarty Heck MD;  [place]  Service: Vascular;  Laterality: Left;   LEFT HEART CATH AND CORONARY ANGIOGRAPHY N/A 03/15/2017   Procedure: LEFT HEART CATH AND CORONARY ANGIOGRAPHY;  Surgeon: ENelva Bush MD;  [place] [facility];  Service: Cardiovascular;  Laterality: N/A;   LOWER EXTREMITY INTERVENTION Left 04/29/2020   Procedure: LOWER EXTREMITY INTERVENTION- LYSIS;  Surgeon: CMarty Heck MD;  [place] [facility];  Service: Cardiovascular;  Laterality: Left;   OSTEOTOMY AND ULNAR  SHORTENING Right 07/22/2002   PATCH ANGIOPLASTY Left 05/01/2020   Procedure: PATCH ANGIOPLASTY USING HEMASHIELD PLATINUM FINESSE PATCH;  Surgeon: Waynetta Sandy, MD;  Location: [facility];  Service: Vascular;  Laterality: Left;   PERIPHERAL VASCULAR ATHERECTOMY  01/23/2020   Procedure: PERIPHERAL VASCULAR ATHERECTOMY;  Surgeon: Marty Heck, MD;  Location: [place] [facility];  Service: Cardiovascular;;   PERIPHERAL VASCULAR BALLOON ANGIOPLASTY  01/23/2020   Procedure: PERIPHERAL VASCULAR BALLOON  ANGIOPLASTY;  Surgeon: Marty Heck, MD;  Location: [place] [facility];  Service: Cardiovascular;;   PERIPHERAL VASCULAR INTERVENTION Left 04/30/2020   Procedure: PERIPHERAL VASCULAR INTERVENTION;  Surgeon: Cherre Robins, MD;  Location: [place] [facility];  Service: Cardiovascular;  Laterality: Left;   PERIPHERAL VASCULAR THROMBECTOMY N/A 01/23/2020   Procedure: lysis recheck;  Surgeon: Marty Heck, MD;  Location: [place] [facility];  Service: Cardiovascular;  Laterality: N/A;  + [place]    PERIPHERAL VASCULAR THROMBECTOMY N/A 04/30/2020   Procedure: Lysis Recheck;  Surgeon: Cherre Robins, MD;  Location: [facility] [facility];  Service: Cardiovascular;  Laterality: N/A;   SHOULDER ARTHROSCOPY WITH BICEPSTENOTOMY Right 03/11/2015   Procedure: SHOULDER ARTHROSCOPY WITH BICEPSTENOTOMY;  Surgeon: Ninetta Lights, MD;  Location: [facility];  Service: Orthopedics;  Laterality: Right;   SHOULDER ARTHROSCOPY WITH DISTAL CLAVICLE RESECTION Left 11/19/2014   Procedure: SHOULDER ARTHROSCOPY WITH DISTAL CLAVICLE RESECTION;  Surgeon: Kathryne Hitch, MD;  Location: [place];  Service: Orthopedics;  Laterality: Left;   SHOULDER ARTHROSCOPY WITH ROTATOR CUFF REPAIR AND SUBACROMIAL DECOMPRESSION Left 11/19/2014   Procedure: LEFT SHOULDER ARTHROSCOPY, DEBRIDEMENT DISTAL CLAVICLE EXCISION, ACROMIOPLASTY WITH ROTATOR CUFF REPAIR ;  Surgeon: Kathryne Hitch, MD;  Location: [facility];  Service: Orthopedics;  Laterality: Left;   THROMBECTOMY OF BYPASS GRAFT FEMORAL- POPLITEAL ARTERY Left 05/01/2020   Procedure: THROMBECTOMY OF LOWER EXTREMITY;  Surgeon: Waynetta Sandy, MD;  Location: [place];  Service: Vascular;  Laterality: Left;   TRANSFORAMINAL LUMBAR INTERBODY FUSION (TLIF) WITH PEDICLE SCREW FIXATION 1 LEVEL N/A 01/03/2018   Procedure: TRANSFORAMINAL LUMBAR INTERBODY FUSION (TLIF) LUMBAR FIVE-SACRAL ONE;  Surgeon: Melina Schools, MD;  Location: [place];   Service: Orthopedics;  Laterality: N/A;   ULTRASOUND GUIDANCE FOR VASCULAR ACCESS Right 01/22/2020   Procedure: ULTRASOUND GUIDANCE FOR VASCULAR ACCESS Right Common Femoral Artery.;  Surgeon: Marty Heck, MD;  Location: [facility];  Service: Vascular;  Laterality: Right;   VIDEO ASSISTED THORACOSCOPY (VATS)/DECORTICATION Right 11/21/2011   drainage of empyema    There were no vitals filed for this visit.   Subjective Assessment - 07/08/21 1452     Subjective Pain management doctor wants him to get an MRI of the cervical spine to rule out issues. Needs to his PCP to get referral. Planning to call them tomorrow. Still with numbeness, tingling and pain in right UE from shoulder down.    Patient is accompained by: --   friend, [name] [name]   Pertinent History RA, DM2, HTN, PAD, DDD,  s/p multiple L LE revascularizations discharged from hospital a 05/05/20.  At that time he was deemed to not be a candidate for further revasc procedures.  Dc'd home with a known bypass occlusion with a plan for AKA if worsening pain. Returned to ED 05/08/20  for increased pain and seeking amputation. s/p L AKA 05/11/20    Patient Stated Goals Pt wants to be able to walk better.    Currently in Pain? Yes    Pain Score 6  Pain Location Arm    Pain Orientation Right    Pain Descriptors / Indicators Burning;Numbness    Pain Type Chronic pain    Pain Onset More than a month ago    Pain Frequency Constant    Aggravating Factors  unsure    Pain Relieving Factors ice, [place] medicated creame                    [facility] Adult PT Treatment/Exercise - 07/08/21 1458       Transfers   Transfers Sit to Stand;Stand to Sit    Sit to Stand 5: Supervision;With upper extremity assist;From chair/3-in-1    Sit to Stand Details Verbal cues for technique    Sit to Stand Details (indicate cue type and reason) reminder cues    Five time sit to stand comments  28.32 seconds from wheelchair with UE support, partial stands as  pt did not stand fully to engage prosthesis each time. min guard assist for safety.    Stand to Sit 5: Supervision;With upper extremity assist;To chair/3-in-1    Stand to Sit Details (indicate cue type and reason) Verbal cues for technique      Ambulation/Gait   Ambulation/Gait Yes    Ambulation/Gait Assistance 5: Supervision;4: Min guard    Ambulation/Gait Assistance Details cues for posture, hip/trunk extension, weight shifting and step length with gait. Pt's prosthesis noted to rotate most with turns and note pt to be pivoting on prosthesis vs stepping with it which could be contributing to the rotation. worked on pivot turns after second gait rep to further address this. gait distance limited by UE pain and fatigue per pt report.    Ambulation Distance (Feet) 60 Feet   x2   Assistive device Rolling walker;Prosthesis    Gait Pattern Step-through pattern;Decreased stance time - left;Decreased weight shift to left    Ambulation Surface Level;Indoor      High Level Balance   High Level Balance Comments short distance from chair<>wheelchair along ~15 foot path working on 90* turns with emphasis on stepping, not pivoting on prosthesis to prevent rotation with min guard to min assist.      Prosthetics   Prosthetic Care Comments  reports the cut sock is helping alot with prosthetic fit and comfort.    Current prosthetic wear tolerance (days/week)  daily    Current prosthetic wear tolerance (#hours/day)  2 hours/day    Residual limb condition  intact per pt report.    Donning Prosthesis Minimal assist    Doffing Prosthesis Supervision                   PT Short Term Goals - 07/08/21 1659       PT SHORT TERM GOAL #1   Title Pt will be able to tolerate wearing prosthesis all awake hours for improved function.    Baseline 06/24/21 only wearing 3-4 hours/day    Status Not Met      PT SHORT TERM GOAL #2   Title Pt will be independent with prosthetic management for improved function.     Baseline 06/24/21 currently needs assistance with sock ply adjustment    Status Not Met      PT SHORT TERM GOAL #3   Title Pt will be able to ambulate 200' with RW initiating prosthetic knee flexion on level surfaces supervision for improved household mobility.    Baseline 48' min assist/CGA with RW and prosthesis locked out. 06/24/21 115' with RW with prosthesis  locked out CGA.    Status On-going      PT SHORT TERM GOAL #4   Title Pt will decrease 5 x sit to stand to </= 18 sec for improved balance and functional mobility.    Baseline 07/08/21: 28.32 secs with UE support from chair, improved from 29.56 just not to goal    Status Not Met    Target Date --      PT SHORT TERM GOAL #5   Title Berg Balance will be assessed and LTG written    Baseline 06/13/21 18/56    Time --    Period --    Status Achieved    Target Date --               PT Long Term Goals - 06/13/21 1634       PT LONG TERM GOAL #1   Title Pt will be independent with HEP for strengthening, ROM and balance to continue gains on own.    Baseline no current HEP    Time 12    Period Weeks    Status New    Target Date 07/22/21      PT LONG TERM GOAL #2   Title Pt will increase gait speed to >0.51ms for improved short community distances.    Baseline 04/27/21 0.183m    Time 12    Period Weeks    Status New    Target Date 07/22/21      PT LONG TERM GOAL #3   Title Pt will ambulate >400' with walker on varied level surfaces mod I for short community distances.    Baseline 04/27/21 75' min assit with RW    Time 12    Period Weeks    Status New    Target Date 07/22/21      PT LONG TERM GOAL #4   Title Pt will ambulate up/down 4 steps with rails mod I for improved community access.    Baseline unable    Time 12    Period Weeks    Status New    Target Date 07/22/21      PT LONG TERM GOAL #5   Title Pt will increase Berg score from 18 to >30/56 for improved balance and decreased fall risk.     Baseline 06/13/21 18/56    Time 12    Period Weeks    Status New    Target Date 07/22/21                   Plan - 07/08/21 1458     Clinical Impression Statement Today's skilled session continued to address prosthetic education and transfers/gait with prosthesis with rest breaks taken due to increased UE pain from pressure on walker. Pt does still have some issues with rotation of prosthesis, continue to work on pivot stepping to address this. The pt is making progress and should benefit from continued PT to progress toward unmet goals.    Personal Factors and Comorbidities Comorbidity 3+;Time since onset of injury/illness/exacerbation    Comorbidities RA, DM2, HTN, PAD, DDD,  s/p multiple L LE revascularizations discharged from hospital a 05/05/20.  At that time he was deemed to not be a candidate for further revasc procedures.  Dc'd home with a known bypass occlusion with a plan for AKA if worsening pain. Returned to ED 05/08/20  for increased pain and seeking amputation. s/p L AKA 05/11/20. Pt reports 3 shoulder surgeries on each shoulder and low back surgery  Examination-Activity Limitations Bathing;Locomotion Level;Transfers;Stairs;Stand;Squat;Lift    Examination-Participation Restrictions Church;Meal Prep    Stability/Clinical Decision Making Evolving/Moderate complexity    Rehab Potential Good    PT Frequency 2x / week   plus eval   PT Duration 12 weeks    PT Treatment/Interventions ADLs/Self Care Home Management;DME Instruction;Gait training;Stair training;Functional mobility training;Therapeutic activities;Therapeutic exercise;Balance training;Neuromuscular re-education;Manual techniques;Passive range of motion;Prosthetic Training;Patient/family education;Vestibular    PT Next Visit Plan Continue proper sock adjustment education. Half sock to provide more secure fit distally. Continue standing balance and working on upright posture.  GAIT WITH PROSTHESIS LOCKED OUT RIGHT NOW  as unable to lock on own, work on weight shifting over prosthesis.  Prosthetic education.    Consulted and Agree with Plan of Care Patient;Other (Comment)   friend, [name] [name]            Patient will benefit from skilled therapeutic intervention in order to improve the following deficits and impairments:  Abnormal gait, Decreased mobility, Decreased strength, Impaired flexibility, Decreased range of motion, Decreased knowledge of use of DME, Prosthetic Dependency, Decreased balance, Decreased activity tolerance  Visit Diagnosis: Other abnormalities of gait and mobility  Muscle weakness (generalized)  Unsteadiness on feet  Abnormal posture     Problem List Patient Active Problem List   Diagnosis Date Noted   Paronychia of great toe 01/28/2021   Onychomycosis 01/28/2021   Critical limb ischemia with history of revascularization of same extremity ([place]) 04/28/2020   Limb ischemia 01/23/2020   Ischemic leg 01/22/2020   History of COVID-19 05/10/2019   PAD (peripheral artery disease) ([place])    History of critical lower limb ischemia 02/19/2019   S/P lumbar fusion, Lower back pain 01/03/2018   Coronary artery disease involving native coronary artery of native heart with unstable angina pectoris [facility])    Preventative health care 03/13/2017   Transaminitis 12/16/2015   Bilateral shoulder pain 08/20/2012   Tobacco use disorder 08/20/2012   HTN (hypertension) 04/20/2011   HLD (hyperlipidemia) 10/08/2009   Rheumatoid arthritis ([place]) 08/11/2009   Willow Ora, PTA, [facility] [address] [place], [place] [address] [phone] 07/08/21, 5:01 PM   Name: [name] [name] MRN: [id number] Date of Birth: [birth year]

## 2021-07-12 ENCOUNTER — Ambulatory Visit: Payer: Medicare Other

## 2021-07-12 ENCOUNTER — Other Ambulatory Visit: Payer: Self-pay

## 2021-07-12 DIAGNOSIS — R2681 Unsteadiness on feet: Secondary | ICD-10-CM

## 2021-07-12 DIAGNOSIS — M6281 Muscle weakness (generalized): Secondary | ICD-10-CM

## 2021-07-12 DIAGNOSIS — R2689 Other abnormalities of gait and mobility: Secondary | ICD-10-CM

## 2021-07-12 DIAGNOSIS — R293 Abnormal posture: Secondary | ICD-10-CM | POA: Diagnosis not present

## 2021-07-12 NOTE — Therapy (Signed)
[facility] [address] [place], [place], [address] Phone: [phone]   Fax:  [phone]  Physical Therapy Treatment  Patient Details  Name: [name] [name] MRN: [id number] Date of Birth: [birth year] Referring Provider (PT): left AKA   Encounter Date: 07/12/2021   PT End of Session - 07/12/21 1448     Visit Number 13    Number of Visits 25    Date for PT Re-Evaluation 07/22/21    Authorization Type UHC medicare with medicaid secondary so 10th visit progress note    PT Start Time 1446    PT Stop Time 1527    PT Time Calculation (min) 41 min    Equipment Utilized During Treatment Gait belt    Activity Tolerance Patient tolerated treatment well;Patient limited by pain    Behavior During Therapy [facility] for tasks assessed/performed             Past Medical History:  Diagnosis Date   Anemia    Anemia 08/06/2012   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   Hypertension    states is borderline on medication; has been on med. x 5-6 yr.   Hypokalemia 05/30/2018   Impingement syndrome of shoulder region 10/2014   left   Insomnia 06/01/2015   Ischemic ulcer of toe of left foot (HCC)    Left shoulder pain 12/16/2015   Low back pain without sciatica 10/29/2009   Lupus ([place])    Peripheral vascular disease (HCC)    Pneumonia    RA (rheumatoid arthritis) ([place])    Rotator cuff tear 11/04/2014    Past Surgical History:  Procedure Laterality Date   ABDOMINAL AORTAGRAM  02/20/2019   ABDOMINAL AORTOGRAM W/LOWER EXTREMITY N/A 02/20/2019   Procedure: ABDOMINAL AORTOGRAM W/LOWER EXTREMITY;  Surgeon: Marty Heck, MD;  Location: [facility];  Service: Cardiovascular;  Laterality: N/A;   ACHILLES TENDON SURGERY Bilateral     AMPUTATION Left 05/11/2020   Procedure: LEFT ABOVE KNEE AMPUTATION;  Surgeon: Angelia Mould, MD;  Location: [facility];  Service: Vascular;  Laterality: Left;   ENDARTERECTOMY POPLITEAL Left 05/01/2020   Procedure: ENDARTERECTOMY POPLITEAL;  Surgeon: Waynetta Sandy, MD;  Location: [facility];  Service: Vascular;  Laterality: Left;   FASCIOTOMY Left 05/01/2020   Procedure: FASCIOTOMY;  Surgeon: Waynetta Sandy, MD;  Location: [facility];  Service: Vascular;  Laterality: Left;   FEMORAL-POPLITEAL BYPASS GRAFT Left 02/26/2019   Procedure: BYPASS GRAFT FEMORAL-POPLITEAL ARTERY LEFT LEG USING 34m PROPATEN GRAFT;  Surgeon: BSerafina Mitchell MD;  Location[place] [facility]  Service: Vascular;  Laterality: Left;   INTRAOPERATIVE ARTERIOGRAM Left 01/22/2020   Procedure: INTRA OPERATIVE ARTERIOGRAM with administration of thrombolyics in Left femoral to popliteal bypass.;  Surgeon: CMarty Heck MD;  [place] [facility]  Service: Vascular;  Laterality: Left;   LEFT HEART CATH AND CORONARY ANGIOGRAPHY N/A 03/15/2017   Procedure: LEFT HEART CATH AND CORONARY ANGIOGRAPHY;  Surgeon: ENelva Bush MD;  [facility];  Service: Cardiovascular;  Laterality: N/A;   LOWER EXTREMITY INTERVENTION Left 04/29/2020   Procedure: LOWER EXTREMITY INTERVENTION- LYSIS;  Surgeon: CMarty Heck MD;  [facility];  Service: Cardiovascular;  Laterality: Left;   OSTEOTOMY AND ULNAR  SHORTENING Right 07/22/2002   PATCH ANGIOPLASTY Left 05/01/2020   Procedure: PATCH ANGIOPLASTY USING HEMASHIELD PLATINUM FINESSE PATCH;  Surgeon: Waynetta Sandy, MD;  Location: [facility];  Service: Vascular;  Laterality: Left;   PERIPHERAL VASCULAR ATHERECTOMY  01/23/2020   Procedure: PERIPHERAL VASCULAR ATHERECTOMY;  Surgeon: Marty Heck, MD;  Location: [facility];  Service: Cardiovascular;;   PERIPHERAL VASCULAR BALLOON ANGIOPLASTY  01/23/2020   Procedure: PERIPHERAL VASCULAR BALLOON  ANGIOPLASTY;  Surgeon: Marty Heck, MD;  Location: [facility];  Service: Cardiovascular;;   PERIPHERAL VASCULAR INTERVENTION Left 04/30/2020   Procedure: PERIPHERAL VASCULAR INTERVENTION;  Surgeon: Cherre Robins, MD;  Location: [facility];  Service: Cardiovascular;  Laterality: Left;   PERIPHERAL VASCULAR THROMBECTOMY N/A 01/23/2020   Procedure: lysis recheck;  Surgeon: Marty Heck, MD;  Location: [facility];  Service: Cardiovascular;  Laterality: N/A;  + [place]    PERIPHERAL VASCULAR THROMBECTOMY N/A 04/30/2020   Procedure: Lysis Recheck;  Surgeon: Cherre Robins, MD;  Location: [facility];  Service: Cardiovascular;  Laterality: N/A;   SHOULDER ARTHROSCOPY WITH BICEPSTENOTOMY Right 03/11/2015   Procedure: SHOULDER ARTHROSCOPY WITH BICEPSTENOTOMY;  Surgeon: Ninetta Lights, MD;  Location: [facility];  Service: Orthopedics;  Laterality: Right;   SHOULDER ARTHROSCOPY WITH DISTAL CLAVICLE RESECTION Left 11/19/2014   Procedure: SHOULDER ARTHROSCOPY WITH DISTAL CLAVICLE RESECTION;  Surgeon: Kathryne Hitch, MD;  Location: [facility];  Service: Orthopedics;  Laterality: Left;   SHOULDER ARTHROSCOPY WITH ROTATOR CUFF REPAIR AND SUBACROMIAL DECOMPRESSION Left 11/19/2014   Procedure: LEFT SHOULDER ARTHROSCOPY, DEBRIDEMENT DISTAL CLAVICLE EXCISION, ACROMIOPLASTY WITH ROTATOR CUFF REPAIR ;  Surgeon: Kathryne Hitch, MD;  Location: [facility];  Service: Orthopedics;  Laterality: Left;   THROMBECTOMY OF BYPASS GRAFT FEMORAL- POPLITEAL ARTERY Left 05/01/2020   Procedure: THROMBECTOMY OF LOWER EXTREMITY;  Surgeon: Waynetta Sandy, MD;  Location: [facility];  Service: Vascular;  Laterality: Left;   TRANSFORAMINAL LUMBAR INTERBODY FUSION (TLIF) WITH PEDICLE SCREW FIXATION 1 LEVEL N/A 01/03/2018   Procedure: TRANSFORAMINAL LUMBAR INTERBODY FUSION (TLIF) LUMBAR FIVE-SACRAL ONE;  Surgeon: Melina Schools, MD;  Location: [facility];   Service: Orthopedics;  Laterality: N/A;   ULTRASOUND GUIDANCE FOR VASCULAR ACCESS Right 01/22/2020   Procedure: ULTRASOUND GUIDANCE FOR VASCULAR ACCESS Right Common Femoral Artery.;  Surgeon: Marty Heck, MD;  Location: [facility];  Service: Vascular;  Laterality: Right;   VIDEO ASSISTED THORACOSCOPY (VATS)/DECORTICATION Right 11/21/2011   drainage of empyema    There were no vitals filed for this visit.   Subjective Assessment - 07/12/21 1448     Subjective Pt reports that he needs to call PCP later today to get a referral for the cervical MRI. Dog chewed his phone cord so has not been able to call anyone. Pt reports that he is getting pain and numbness down whole right arm.    Patient is accompained by: --   friend, [name] [name]   Pertinent History RA, DM2, HTN, PAD, DDD,  s/p multiple L LE revascularizations discharged from hospital a 05/05/20.  At that time he was deemed to not be a candidate for further revasc procedures.  Dc'd home with a known bypass occlusion with a plan for AKA if worsening pain. Returned to ED 05/08/20  for increased pain and seeking amputation. s/p L AKA 05/11/20    Patient Stated Goals Pt wants to be able to walk better.    Currently in Pain? Yes  Pain Score 5     Pain Location Hip    Pain Orientation Left    Pain Descriptors / Indicators Aching    Pain Type Acute pain    Pain Onset More than a month ago    Pain Frequency Intermittent    Aggravating Factors  rain                               [facility] Adult PT Treatment/Exercise - 07/12/21 1453       Transfers   Transfers Sit to Stand;Stand to Sit    Sit to Stand 5: Supervision;With upper extremity assist    Stand to Sit 5: Supervision;With upper extremity assist      Ambulation/Gait   Ambulation/Gait Yes    Ambulation/Gait Assistance 5: Supervision;4: Min guard    Ambulation/Gait Assistance Details Pt was cued to stand tall and be sure to pick up left foot and not pivot on it to prevent  rotating prosthesis. Pt rested in between bouts due to right arm pain/numbness. Prosthesis locked out throughout    Ambulation Distance (Feet) 115 Feet   x 2   Assistive device Rolling walker;Prosthesis    Gait Pattern Step-through pattern;Decreased stance time - left    Ambulation Surface Level;Indoor      Neuro Re-ed    Neuro Re-ed Details  In // bars: standing without UE support working on upright posture 1 min 30 sec, eyes clsoed 15-20 sec x 2 CGA. Standing with reaching across body for 3 cones and handing to other side x 2 each way. Standing with 1 UE support stepping forward and back with RLE to increase left weight shift x 3 CGA. Pt had fast right step. Tactile cues to try to weight shift more to left.      Prosthetics   Prosthetic Care Comments  Pt currently wearing the 8 ply with the cut sock on bottom.                       PT Short Term Goals - 07/08/21 1659       PT SHORT TERM GOAL #1   Title Pt will be able to tolerate wearing prosthesis all awake hours for improved function.    Baseline 06/24/21 only wearing 3-4 hours/day    Status Not Met      PT SHORT TERM GOAL #2   Title Pt will be independent with prosthetic management for improved function.    Baseline 06/24/21 currently needs assistance with sock ply adjustment    Status Not Met      PT SHORT TERM GOAL #3   Title Pt will be able to ambulate 200' with RW initiating prosthetic knee flexion on level surfaces supervision for improved household mobility.    Baseline 48' min assist/CGA with RW and prosthesis locked out. 06/24/21 115' with RW with prosthesis locked out CGA.    Status On-going      PT SHORT TERM GOAL #4   Title Pt will decrease 5 x sit to stand to </= 18 sec for improved balance and functional mobility.    Baseline 07/08/21: 28.32 secs with UE support from chair, improved from 29.56 just not to goal    Status Not Met    Target Date --      PT SHORT TERM GOAL #5   Title Berg Balance will  be assessed and LTG written    Baseline 06/13/21  18/56    Time --    Period --    Status Achieved    Target Date --               PT Long Term Goals - 06/13/21 1634       PT LONG TERM GOAL #1   Title Pt will be independent with HEP for strengthening, ROM and balance to continue gains on own.    Baseline no current HEP    Time 12    Period Weeks    Status New    Target Date 07/22/21      PT LONG TERM GOAL #2   Title Pt will increase gait speed to >0.50ms for improved short community distances.    Baseline 04/27/21 0.181m    Time 12    Period Weeks    Status New    Target Date 07/22/21      PT LONG TERM GOAL #3   Title Pt will ambulate >400' with walker on varied level surfaces mod I for short community distances.    Baseline 04/27/21 75' min assit with RW    Time 12    Period Weeks    Status New    Target Date 07/22/21      PT LONG TERM GOAL #4   Title Pt will ambulate up/down 4 steps with rails mod I for improved community access.    Baseline unable    Time 12    Period Weeks    Status New    Target Date 07/22/21      PT LONG TERM GOAL #5   Title Pt will increase Berg score from 18 to >30/56 for improved balance and decreased fall risk.    Baseline 06/13/21 18/56    Time 12    Period Weeks    Status New    Target Date 07/22/21                   Plan - 07/12/21 2148     Clinical Impression Statement Pt was able to increase gait distance and bouts. Most limited by RUE pain/numbness. Did not have to readjust prosthesis for first time today. Pt showing better weight shift over prosthesis. Able to increase time standing upright without UE support.    Personal Factors and Comorbidities Comorbidity 3+;Time since onset of injury/illness/exacerbation    Comorbidities RA, DM2, HTN, PAD, DDD,  s/p multiple L LE revascularizations discharged from hospital a 05/05/20.  At that time he was deemed to not be a candidate for further revasc procedures.  Dc'd home  with a known bypass occlusion with a plan for AKA if worsening pain. Returned to ED 05/08/20  for increased pain and seeking amputation. s/p L AKA 05/11/20. Pt reports 3 shoulder surgeries on each shoulder and low back surgery    Examination-Activity Limitations Bathing;Locomotion Level;Transfers;Stairs;Stand;Squat;Lift    Examination-Participation Restrictions Church;Meal Prep    Stability/Clinical Decision Making Evolving/Moderate complexity    Rehab Potential Good    PT Frequency 2x / week   plus eval   PT Duration 12 weeks    PT Treatment/Interventions ADLs/Self Care Home Management;DME Instruction;Gait training;Stair training;Functional mobility training;Therapeutic activities;Therapeutic exercise;Balance training;Neuromuscular re-education;Manual techniques;Passive range of motion;Prosthetic Training;Patient/family education;Vestibular    PT Next Visit Plan Continue proper sock adjustment education. Half sock to provide more secure fit distally. Continue standing balance and working on upright posture.  GAIT WITH PROSTHESIS LOCKED OUT RIGHT NOW as unable to lock on own, work on weight shifting  over prosthesis.  Prosthetic education.    Consulted and Agree with Plan of Care Patient;Other (Comment)   friend, [name] [name]            Patient will benefit from skilled therapeutic intervention in order to improve the following deficits and impairments:  Abnormal gait, Decreased mobility, Decreased strength, Impaired flexibility, Decreased range of motion, Decreased knowledge of use of DME, Prosthetic Dependency, Decreased balance, Decreased activity tolerance  Visit Diagnosis: Other abnormalities of gait and mobility  Muscle weakness (generalized)  Unsteadiness on feet     Problem List Patient Active Problem List   Diagnosis Date Noted   Paronychia of great toe 01/28/2021   Onychomycosis 01/28/2021   Critical limb ischemia with history of revascularization of same extremity ([place]) 04/28/2020    Limb ischemia 01/23/2020   Ischemic leg 01/22/2020   History of COVID-19 05/10/2019   PAD (peripheral artery disease) ([place])    History of critical lower limb ischemia 02/19/2019   S/P lumbar fusion, Lower back pain 01/03/2018   Coronary artery disease involving native coronary artery of native heart with unstable angina pectoris [facility])    Preventative health care 03/13/2017   Transaminitis 12/16/2015   Bilateral shoulder pain 08/20/2012   Tobacco use disorder 08/20/2012   HTN (hypertension) 04/20/2011   HLD (hyperlipidemia) 10/08/2009   Rheumatoid arthritis ([place]) 08/11/2009    Electa Sniff, PT, DPT, NCS 07/12/2021, 9:50 PM  [facility] [address] [place], [place], [address] Phone: [phone]   Fax:  [phone]  Name: [name] [name] MRN: [id number] Date of Birth: 1960-11-15

## 2021-07-15 ENCOUNTER — Ambulatory Visit: Payer: Medicare Other

## 2021-07-15 ENCOUNTER — Telehealth: Payer: Self-pay | Admitting: Internal Medicine

## 2021-07-15 NOTE — Telephone Encounter (Signed)
Refill Request    predniSONE (DELTASONE) 10 MG tablet  [id number] - [place], [place] (Ph: [phone])

## 2021-07-15 NOTE — Telephone Encounter (Signed)
30 tabs last sent 03/22/21. Call placed to patient. States prednisone helps with his breathing and his arthritis. States he he has just enough SHOB to notice it when lying down. States he always sleeps on 2 pillows but hasn't needed to add any additional ones. Also, states his RA causes his palm and knuckles on right hand to swell. He made appt on Monday 1/23 at 0915 with [facility]. Explained most likely his request for prednisone refill would need to be addressed at upcoming visit. He states understanding. He is advised that if [facility] increases or develops CP, to head directly to ED and not wait for this appt. He is in agreement.

## 2021-07-18 ENCOUNTER — Other Ambulatory Visit: Payer: Self-pay

## 2021-07-18 ENCOUNTER — Encounter: Payer: Self-pay | Admitting: Internal Medicine

## 2021-07-18 ENCOUNTER — Ambulatory Visit (INDEPENDENT_AMBULATORY_CARE_PROVIDER_SITE_OTHER): Payer: Medicare Other | Admitting: Internal Medicine

## 2021-07-18 VITALS — BP 130/82 | HR 88 | Temp 98.4°F | Wt 178.0 lb

## 2021-07-18 DIAGNOSIS — M5412 Radiculopathy, cervical region: Secondary | ICD-10-CM

## 2021-07-18 DIAGNOSIS — I10 Essential (primary) hypertension: Secondary | ICD-10-CM

## 2021-07-18 DIAGNOSIS — M79601 Pain in right arm: Secondary | ICD-10-CM

## 2021-07-18 DIAGNOSIS — I739 Peripheral vascular disease, unspecified: Secondary | ICD-10-CM

## 2021-07-18 DIAGNOSIS — M0579 Rheumatoid arthritis with rheumatoid factor of multiple sites without organ or systems involvement: Secondary | ICD-10-CM

## 2021-07-18 MED ORDER — CLOPIDOGREL BISULFATE 75 MG PO TABS: 75.0000 mg | ORAL_TABLET | Freq: Every day | ORAL | 11 refills | Status: DC

## 2021-07-18 MED ORDER — PREDNISONE 20 MG PO TABS
ORAL_TABLET | ORAL | 0 refills | Status: DC
Start: 2021-07-18 — End: 2021-07-21

## 2021-07-18 NOTE — Patient Instructions (Signed)
Dear [name] [name],  Today we discussed your arm pain. We have placed a referral for an MRI. We also will give you a short course of prednisone. Take 3 tablets for a total of 60mg  daily for 5 days. Then after 5 days, take 2 tablets for a total of 40mg  daily for 3 days. Then take 1 tablet for a total of 20mg  daily for 3 days. We have also sent in a prescription for Plavix, please take this daily. We also recommend you call your vascular surgeons office.  Please follow up in 3 months.

## 2021-07-18 NOTE — Progress Notes (Addendum)
° °  CC: fall follow up, requesting imaging of arm.  HPI:[name] is a 61 y.o. male who presents for evaluation of follow up fall, requesting imaging of arm. Please see individual problem based A/P for details.   Depression, PHQ-9: Based on the patients  [place] Visit from 01/27/2021 in [facility]  PHQ-9 Total Score 3      score we have 3.  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   Hypertension    states is borderline on medication; has been on med. x 5-6 yr.   Hypokalemia 05/30/2018   Impingement syndrome of shoulder region 10/2014   left   Insomnia 06/01/2015   Ischemic ulcer of toe of left foot (HCC)    Left shoulder pain 12/16/2015   Low back pain without sciatica 10/29/2009   Lupus ([place])    Peripheral vascular disease (HCC)    Pneumonia    RA (rheumatoid arthritis) ([place])    Rotator cuff tear 11/04/2014   Review of Systems:   Review of Systems  Constitutional: Negative.   HENT: Negative.    Eyes: Negative.   Respiratory: Negative.    Cardiovascular: Negative.   Gastrointestinal: Negative.   Musculoskeletal: Negative.   Skin: Negative.   Neurological: Negative.   Endo/Heme/Allergies: Negative.   Psychiatric/Behavioral: Negative.      Physical Exam: Vitals:   07/18/21 0945  BP: 130/82  Pulse: 88  Temp: 98.4 F (36.9 C)  TempSrc: Oral  SpO2: 99%  Weight: 178 lb (80.7 kg)     General: alert and oriented HEENT: Conjunctiva nl , antiicteric sclerae, moist mucous membranes, no exudate or erythema Cardiovascular: Normal rate, regular rhythm.  No murmurs, rubs, or gallops Pulmonary : Equal breath sounds, No wheezes, rales, or rhonchi Abdominal: soft,  nontender,  bowel sounds present Ext: Left AKA. Right distal great and 2nd toe cool to the touch with mild discoloration. Ulnar deviation of fingers bilaterally. Neuro: decreased grip strength right hand. Decreased sensation of RUE.  Assessment & Plan:   See Encounters Tab for problem based charting.  Patient seen with Dr. Philipp Ovens

## 2021-07-19 ENCOUNTER — Ambulatory Visit: Payer: Medicare Other | Admitting: Physical Therapy

## 2021-07-19 ENCOUNTER — Ambulatory Visit (HOSPITAL_COMMUNITY)
Admission: RE | Admit: 2021-07-19 | Discharge: 2021-07-19 | Disposition: A | Discharge status: Home / Self Care | Payer: Medicare Other | Source: Ambulatory Visit | Attending: Vascular Surgery | Admitting: Vascular Surgery

## 2021-07-19 ENCOUNTER — Other Ambulatory Visit: Payer: Self-pay

## 2021-07-19 DIAGNOSIS — I739 Peripheral vascular disease, unspecified: Secondary | ICD-10-CM | POA: Insufficient documentation

## 2021-07-21 ENCOUNTER — Other Ambulatory Visit: Payer: Self-pay

## 2021-07-21 ENCOUNTER — Ambulatory Visit (INDEPENDENT_AMBULATORY_CARE_PROVIDER_SITE_OTHER): Payer: Medicare Other | Admitting: Physician Assistant

## 2021-07-21 VITALS — BP 131/79 | HR 86 | Temp 97.9°F | Ht 68.0 in

## 2021-07-21 DIAGNOSIS — I739 Peripheral vascular disease, unspecified: Secondary | ICD-10-CM | POA: Diagnosis not present

## 2021-07-21 NOTE — Progress Notes (Signed)
HISTORY AND PHYSICAL     CC:  follow up. Requesting Provider:  Scarlett Presto, MD  HPI: This is a 61 y.o. male who is here today for follow up for PAD.  He went for arteriogram and was found to have a flush occlusion of his left superficial femoral artery with single-vessel runoff via the anterior tibial artery.  On 02/26/2019 he underwent left common femoral to below-knee popliteal artery bypass graft with PTFE.  His saphenous vein was harvested but it was ultimately not adequate.  He presented in late July 2021 with bypass graft occlusion.  He underwent lytic intervention including laser atherectomy and mechanical thrombectomy.  A Tigris stent was placed across the distal anastomosis.  He was discharged home on Eliquis and Plavix.  His aspirin was discontinued. He subsequently had acute on chronic limb ischemia of the LLE with occluded bypass and underwent lytic therapy in November 2021& then angioplasty of popliteal and ATA on 04/30/2020 and on 05/01/2020, underwent thrombectomy of bypass with endarterectomy of popliteal, TP trunk and peroneal with fasciotomies.  He then had undergone left AKA on 05/11/2020.   Pt was last seen December 2021 and at that time, pt was doing well.  He was scheduled for ABI of the right leg for one year and he comes in today for that visit.    The pt returns today for follow up.  He has prosthesis in place and is working with PT.  He states he has some occasional pain in the right great toe at night and this is relieved with pain medication.  He does not have any non healing wounds.  He states he has cut way back on his cigarette smoking and only smokes occasionally now. He denies any non healing wounds.    The pt is on a statin for cholesterol management.    The pt is not on an aspirin.    Other AC:  Plavix The pt is on CCB for hypertension.  The pt does not have diabetes. Tobacco hx:  current but has cut back    Past Medical History:  Diagnosis Date   Anemia     Anemia 08/06/2012   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   Hypertension    states is borderline on medication; has been on med. x 5-6 yr.   Hypokalemia 05/30/2018   Impingement syndrome of shoulder region 10/2014   left   Insomnia 06/01/2015   Ischemic ulcer of toe of left foot (HCC)    Left shoulder pain 12/16/2015   Low back pain without sciatica 10/29/2009   Lupus ([place])    Peripheral vascular disease (HCC)    Pneumonia    RA (rheumatoid arthritis) ([place])    Rotator cuff tear 11/04/2014    Past Surgical History:  Procedure Laterality Date   ABDOMINAL AORTAGRAM  02/20/2019   ABDOMINAL AORTOGRAM W/LOWER EXTREMITY N/A 02/20/2019   Procedure: ABDOMINAL AORTOGRAM W/LOWER EXTREMITY;  Surgeon: Marty Heck, MD;  Location: [facility];  Service: Cardiovascular;  Laterality: N/A;   ACHILLES TENDON SURGERY Bilateral    AMPUTATION Left 05/11/2020   Procedure: LEFT ABOVE KNEE AMPUTATION;  Surgeon: Angelia Mould, MD;  Location: [facility];  Service: Vascular;  Laterality: Left;   ENDARTERECTOMY POPLITEAL  Left 05/01/2020   Procedure: ENDARTERECTOMY POPLITEAL;  Surgeon: Waynetta Sandy, MD;  Location: [facility];  Service: Vascular;  Laterality: Left;   FASCIOTOMY Left 05/01/2020   Procedure: FASCIOTOMY;  Surgeon: Waynetta Sandy, MD;  Location: [facility];  Service: Vascular;  Laterality: Left;   FEMORAL-POPLITEAL BYPASS GRAFT Left 02/26/2019   Procedure: BYPASS GRAFT FEMORAL-POPLITEAL ARTERY LEFT LEG USING 7mm PROPATEN GRAFT;  Surgeon: Serafina Mitchell, MD;  Location: [facility];  Service: Vascular;  Laterality: Left;   INTRAOPERATIVE ARTERIOGRAM Left 01/22/2020   Procedure: INTRA OPERATIVE ARTERIOGRAM with administration of thrombolyics in Left femoral to popliteal bypass.;  Surgeon:  Marty Heck, MD;  Location: [facility];  Service: Vascular;  Laterality: Left;   LEFT HEART CATH AND CORONARY ANGIOGRAPHY N/A 03/15/2017   Procedure: LEFT HEART CATH AND CORONARY ANGIOGRAPHY;  Surgeon: Nelva Bush, MD;  Location: [facility];  Service: Cardiovascular;  Laterality: N/A;   LOWER EXTREMITY INTERVENTION Left 04/29/2020   Procedure: LOWER EXTREMITY INTERVENTION- LYSIS;  Surgeon: Marty Heck, MD;  Location: [facility];  Service: Cardiovascular;  Laterality: Left;   OSTEOTOMY AND ULNAR SHORTENING Right 07/22/2002   PATCH ANGIOPLASTY Left 05/01/2020   Procedure: PATCH ANGIOPLASTY USING HEMASHIELD PLATINUM FINESSE PATCH;  Surgeon: Waynetta Sandy, MD;  Location: [facility];  Service: Vascular;  Laterality: Left;   PERIPHERAL VASCULAR ATHERECTOMY  01/23/2020   Procedure: PERIPHERAL VASCULAR ATHERECTOMY;  Surgeon: Marty Heck, MD;  Location: [facility];  Service: Cardiovascular;;   PERIPHERAL VASCULAR BALLOON ANGIOPLASTY  01/23/2020   Procedure: PERIPHERAL VASCULAR BALLOON ANGIOPLASTY;  Surgeon: Marty Heck, MD;  Location: [facility];  Service: Cardiovascular;;   PERIPHERAL VASCULAR INTERVENTION Left 04/30/2020   Procedure: PERIPHERAL VASCULAR INTERVENTION;  Surgeon: Cherre Robins, MD;  Location: [facility];  Service: Cardiovascular;  Laterality: Left;   PERIPHERAL VASCULAR THROMBECTOMY N/A 01/23/2020   Procedure: lysis recheck;  Surgeon: Marty Heck, MD;  Location: [facility];  Service: Cardiovascular;  Laterality: N/A;  + [place]    PERIPHERAL VASCULAR THROMBECTOMY N/A 04/30/2020   Procedure: Lysis Recheck;  Surgeon: Cherre Robins, MD;  Location: [facility];  Service: Cardiovascular;  Laterality: N/A;   SHOULDER ARTHROSCOPY WITH BICEPSTENOTOMY Right 03/11/2015   Procedure: SHOULDER ARTHROSCOPY WITH BICEPSTENOTOMY;  Surgeon: Ninetta Lights, MD;  Location: [facility];   Service: Orthopedics;  Laterality: Right;   SHOULDER ARTHROSCOPY WITH DISTAL CLAVICLE RESECTION Left 11/19/2014   Procedure: SHOULDER ARTHROSCOPY WITH DISTAL CLAVICLE RESECTION;  Surgeon: Kathryne Hitch, MD;  Location: [facility];  Service: Orthopedics;  Laterality: Left;   SHOULDER ARTHROSCOPY WITH ROTATOR CUFF REPAIR AND SUBACROMIAL DECOMPRESSION Left 11/19/2014   Procedure: LEFT SHOULDER ARTHROSCOPY, DEBRIDEMENT DISTAL CLAVICLE EXCISION, ACROMIOPLASTY WITH ROTATOR CUFF REPAIR ;  Surgeon: Kathryne Hitch, MD;  Location: [facility];  Service: Orthopedics;  Laterality: Left;   THROMBECTOMY OF BYPASS GRAFT FEMORAL- POPLITEAL ARTERY Left 05/01/2020   Procedure: THROMBECTOMY OF LOWER EXTREMITY;  Surgeon: Waynetta Sandy, MD;  Location: [facility];  Service: Vascular;  Laterality: Left;   TRANSFORAMINAL LUMBAR INTERBODY FUSION (TLIF) WITH PEDICLE SCREW FIXATION 1 LEVEL N/A 01/03/2018   Procedure: TRANSFORAMINAL LUMBAR INTERBODY FUSION (TLIF) LUMBAR FIVE-SACRAL ONE;  Surgeon: Melina Schools, MD;  Location: [facility];  Service: Orthopedics;  Laterality: N/A;   ULTRASOUND GUIDANCE FOR VASCULAR ACCESS Right 01/22/2020   Procedure: ULTRASOUND GUIDANCE FOR VASCULAR ACCESS Right Common Femoral Artery.;  Surgeon: Monica Martinez  J, MD;  Location[facility];  Service: Vascular;  Laterality: Right;   VIDEO ASSISTED THORACOSCOPY (VATS)/DECORTICATION Right 11/21/2011   drainage of empyema    Allergies  Allergen Reactions   Dilaudid [Hydromorphone Hcl] Rash   Ramipril Rash and Other (See Comments)    Per patient on R arm and leg only; no angioedema   Tramadol Itching and Rash    Current Outpatient Medications  Medication Sig Dispense Refill   amLODipine (NORVASC) 10 MG tablet TAKE 1 TABLET BY MOUTH EVERY DAY 90 tablet 1   atorvastatin (LIPITOR) 80 MG tablet Take 1 tablet (80 mg total) by mouth daily at 6 PM. 30 tablet 11   chlorthalidone (HYGROTON) 25 MG tablet TAKE 1 TABLET BY MOUTH  EVERY DAY 90 tablet 1   clopidogrel (PLAVIX) 75 MG tablet Take 1 tablet (75 mg total) by mouth daily. 30 tablet 11   docusate sodium (COLACE) 100 MG capsule Take 1 capsule (100 mg total) by mouth daily. 10 capsule 0   doxycycline (VIBRAMYCIN) 100 MG capsule Take 1 capsule (100 mg total) by mouth 2 (two) times daily. 14 capsule 0   gabapentin (NEURONTIN) 100 MG capsule Take 100 mg by mouth at bedtime as needed (for pain).      Glycerin-Polysorbate 80 (REFRESH DRY EYE THERAPY OP) Place 1 drop into both eyes 3 (three) times daily as needed (for dryness or irritation).      ibuprofen (ADVIL) 200 MG tablet Take 200-600 mg by mouth every 6 (six) hours as needed for headache or mild pain.     naloxone (NARCAN) 2 MG/2ML injection Inject 2 mg into the muscle once as needed (opiod overdose (call 911, inject intramuscularly in shoulder or thigh. Repeat every 3 minutes)).      predniSONE (DELTASONE) 10 MG tablet TAKE 1 TABLET BY MOUTH EVERY DAY 30 tablet 0   varenicline (CHANTIX PAK) 0.5 MG X 11 & 1 MG X 42 tablet TAKE ONE 0.5 MG TABLET BY MOUTH ONCE DAILY FOR 3 DAYS, THEN INCREASE TO ONE 0.5 MG TABLET TWICE DAILY FOR 4 DAYS, THEN INCREASE TO ONE 1 MG TABLET TWICE DAILY. 53 each 0   varenicline (CHANTIX) 1 MG tablet TAKE 1 TABLET BY MOUTH TWICE A DAY 60 tablet 1   No current facility-administered medications for this visit.    Family History  Problem Relation Age of Onset   Heart attack Father 44   Alcohol abuse Father    Aneurysm Mother    Stroke Neg Hx    Cancer Neg Hx     Social History   Socioeconomic History   Marital status: Single    Spouse name: Not on file   Number of children: 0   Years of education: Not on file   Highest education level: Not on file  Occupational History   Occupation: Disabled    Comment: 2/2 RA  Tobacco Use   Smoking status: Every Day    Packs/day: 0.25    Years: 20.00    Pack years: 5.00    Types: Cigarettes    Last attempt to quit: 02/19/2019    Years since  quitting: 2.4   Smokeless tobacco: Never   Tobacco comments:    Patient stated he smokes a couple day and is attempting to quit smoking  Vaping Use   Vaping Use: Never used  Substance and Sexual Activity   Alcohol use: No    Alcohol/week: 0.0 standard drinks    Comment: sober 1998   Drug use:  No   Sexual activity: Not on file  Other Topics Concern   Not on file  Social History Narrative   On disability since 1992, used to work with city [employer] [place]. Quit drinking in 1990.      Current Social History 11/05/2019        Patient lives by himself most of the time. Sometimes girlfriend's son stays with him in a one level home. There are 3 steps with handrail up to the entrance the patient uses.       Patient's method of transportation is personal truck. This was recently vandalized and patient doesn't have funds to repair at this time.      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, BSN, RN-BC    Social Determinants of Health   Financial Resource Strain: Not on file  Food Insecurity: Not on file  Transportation Needs: Not on file  Physical Activity: Not on file  Stress: Not on file  Social Connections: Not on file  Intimate Partner Violence: Not on file     REVIEW OF SYSTEMS:   [X]  denotes positive finding, [ ]  denotes negative finding Cardiac  Comments:  Chest pain or chest pressure:    Shortness of breath upon exertion:    Short of breath when lying flat:    Irregular heart rhythm:        Vascular    Pain in calf, thigh, or hip brought on by  ambulation:    Pain in feet at night that wakes you up from your sleep:     Blood clot in your veins:    Leg swelling:         Pulmonary    Oxygen at home:    Productive cough:     Wheezing:         Neurologic    Sudden weakness in arms or legs:     Sudden numbness in arms or legs:     Sudden onset of difficulty speaking or slurred speech:    Temporary loss of vision in one eye:     Problems with dizziness:         Gastrointestinal    Blood in stool:     Vomited blood:         Genitourinary    Burning when urinating:     Blood in urine:        Psychiatric    Major depression:         Hematologic    Bleeding problems:    Problems with blood clotting too easily:        Skin    Rashes or ulcers:        Constitutional    Fever or chills:      PHYSICAL EXAMINATION:  Today's Vitals   07/21/21 1016  BP: 131/79  Pulse: 86  Temp: 97.9 F (36.6 C)  Height: 5\' 8"  (1.727 m)  PainSc: 4   PainLoc: Hip   Body mass index is 27.06 kg/m.   General:  WDWN in NAD; vital signs documented above Gait: Not observed HENT: WNL, normocephalic Pulmonary: normal non-labored breathing , without wheezing Cardiac: regular HR, without carotid bruits Abdomen: soft, NT, no masses; aortic pulse is not palpable Skin: without rashes Vascular Exam/Pulses: Femoral pulses difficult to palpate due to being in wheelchair; he has monophasic doppler signals right peroneal, AT, and PT  Extremities: without ischemic changes, without Gangrene , without cellulitis; without open wounds;  Musculoskeletal: no muscle wasting or atrophy  Neurologic: A&O X 3 Psychiatric:  The pt has Normal affect.   Non-Invasive Vascular Imaging:   ABI's/TBI's on 07/21/2021: Right:  0.76/0.42 - Great toe pressure: 55 Left:  AKA   Previous ABI's/TBI's on 02/23/2020: Right:  1.22/0.48 - Great toe pressure: 44 Left:  1.29/0.65 - Great toe pressure:  59   ASSESSMENT/PLAN:: 61 y.o. male here for follow up for PAD  with hx of LLE revascularization and subsequent AKA  -pt doing well with AKA and has prosthesis and is working with PT -RLE ABI decreased from previous visit but toe pressure increased.  He does not have any non healing wounds.   -he has cut back on his smoking and now only smoking occasionally.  Discussed the importance of complete cessation of smoking but praised him for cutting back thus far.   -given the decrease in ABI, will have the pt will f/u in 6 months with ABI.  He will call sooner if he develops any rest pain or non healing wounds.   -continue plavix and statin   Leontine Locket, [facility] [phone]  Clinic MD:   Donzetta Matters

## 2021-07-22 ENCOUNTER — Ambulatory Visit: Payer: Medicare Other

## 2021-07-22 DIAGNOSIS — R2681 Unsteadiness on feet: Secondary | ICD-10-CM | POA: Diagnosis not present

## 2021-07-22 DIAGNOSIS — M6281 Muscle weakness (generalized): Secondary | ICD-10-CM

## 2021-07-22 DIAGNOSIS — R293 Abnormal posture: Secondary | ICD-10-CM | POA: Diagnosis not present

## 2021-07-22 DIAGNOSIS — R2689 Other abnormalities of gait and mobility: Secondary | ICD-10-CM | POA: Diagnosis not present

## 2021-07-23 NOTE — Therapy (Signed)
[name] [address] [place], [place], [address] Phone: [phone]   Fax:  [phone]  Physical Therapy Treatment/Recert  Patient Details  Name: [name] [name] MRN: [id number] Date of Birth: July 07, 1960 Referring Provider (PT): left AKA   Encounter Date: 07/22/2021   PT End of Session - 07/22/21 1445     Visit Number 14    Number of Visits 30    Date for PT Re-Evaluation 09/16/21    Authorization Type UHC medicare with medicaid secondary so 10th visit progress note    PT Start Time 1444    PT Stop Time 1524    PT Time Calculation (min) 40 min    Equipment Utilized During Treatment Gait belt    Activity Tolerance Patient tolerated treatment well;Patient limited by pain    Behavior During Therapy [facility] for tasks assessed/performed             Past Medical History:  Diagnosis Date   Anemia    Anemia 08/06/2012   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   Hypertension    states is borderline on medication; has been on med. x 5-6 yr.   Hypokalemia 05/30/2018   Impingement syndrome of shoulder region 10/2014   left   Insomnia 06/01/2015   Ischemic ulcer of toe of left foot (HCC)    Left shoulder pain 12/16/2015   Low back pain without sciatica 10/29/2009   Lupus ([place])    Peripheral vascular disease (HCC)    Pneumonia    RA (rheumatoid arthritis) ([place])    Rotator cuff tear 11/04/2014    Past Surgical History:  Procedure Laterality Date   ABDOMINAL AORTAGRAM  02/20/2019   ABDOMINAL AORTOGRAM W/LOWER EXTREMITY N/A 02/20/2019   Procedure: ABDOMINAL AORTOGRAM W/LOWER EXTREMITY;  Surgeon: Marty Heck, MD;  Location: [facility];  Service: Cardiovascular;  Laterality: N/A;   ACHILLES TENDON SURGERY  Bilateral    AMPUTATION Left 05/11/2020   Procedure: LEFT ABOVE KNEE AMPUTATION;  Surgeon: Angelia Mould, MD;  Location: [facility];  Service: Vascular;  Laterality: Left;   ENDARTERECTOMY POPLITEAL Left 05/01/2020   Procedure: ENDARTERECTOMY POPLITEAL;  Surgeon: Waynetta Sandy, MD;  Location: [facility];  Service: Vascular;  Laterality: Left;   FASCIOTOMY Left 05/01/2020   Procedure: FASCIOTOMY;  Surgeon: Waynetta Sandy, MD;  Location: [facility];  Service: Vascular;  Laterality: Left;   FEMORAL-POPLITEAL BYPASS GRAFT Left 02/26/2019   Procedure: BYPASS GRAFT FEMORAL-POPLITEAL ARTERY LEFT LEG USING 1m PROPATEN GRAFT;  Surgeon: BSerafina Mitchell MD;  [place] [facility]  Service: Vascular;  Laterality: Left;   INTRAOPERATIVE ARTERIOGRAM Left 01/22/2020   Procedure: INTRA OPERATIVE ARTERIOGRAM with administration of thrombolyics in Left femoral to popliteal bypass.;  Surgeon: CMarty Heck MD;  [place] [facility]  Service: Vascular;  Laterality: Left;   LEFT HEART CATH AND CORONARY ANGIOGRAPHY N/A 03/15/2017   Procedure: LEFT HEART CATH AND CORONARY ANGIOGRAPHY;  Surgeon: ENelva Bush MD;  [facility];  Service: Cardiovascular;  Laterality: N/A;   LOWER EXTREMITY INTERVENTION Left 04/29/2020   Procedure: LOWER EXTREMITY INTERVENTION- LYSIS;  Surgeon: CMarty Heck MD;  [facility];  Service: Cardiovascular;  Laterality: Left;   OSTEOTOMY AND ULNAR  SHORTENING Right 07/22/2002   PATCH ANGIOPLASTY Left 05/01/2020   Procedure: PATCH ANGIOPLASTY USING HEMASHIELD PLATINUM FINESSE PATCH;  Surgeon: Waynetta Sandy, MD;  Location: [facility];  Service: Vascular;  Laterality: Left;   PERIPHERAL VASCULAR ATHERECTOMY  01/23/2020   Procedure: PERIPHERAL VASCULAR ATHERECTOMY;  Surgeon: Marty Heck, MD;  Location: [facility];  Service: Cardiovascular;;   PERIPHERAL VASCULAR BALLOON ANGIOPLASTY  01/23/2020   Procedure: PERIPHERAL  VASCULAR BALLOON ANGIOPLASTY;  Surgeon: Marty Heck, MD;  Location: [facility];  Service: Cardiovascular;;   PERIPHERAL VASCULAR INTERVENTION Left 04/30/2020   Procedure: PERIPHERAL VASCULAR INTERVENTION;  Surgeon: Cherre Robins, MD;  Location: [facility];  Service: Cardiovascular;  Laterality: Left;   PERIPHERAL VASCULAR THROMBECTOMY N/A 01/23/2020   Procedure: lysis recheck;  Surgeon: Marty Heck, MD;  Location: [facility];  Service: Cardiovascular;  Laterality: N/A;  + [place]    PERIPHERAL VASCULAR THROMBECTOMY N/A 04/30/2020   Procedure: Lysis Recheck;  Surgeon: Cherre Robins, MD;  Location: [facility];  Service: Cardiovascular;  Laterality: N/A;   SHOULDER ARTHROSCOPY WITH BICEPSTENOTOMY Right 03/11/2015   Procedure: SHOULDER ARTHROSCOPY WITH BICEPSTENOTOMY;  Surgeon: Ninetta Lights, MD;  Location: [facility];  Service: Orthopedics;  Laterality: Right;   SHOULDER ARTHROSCOPY WITH DISTAL CLAVICLE RESECTION Left 11/19/2014   Procedure: SHOULDER ARTHROSCOPY WITH DISTAL CLAVICLE RESECTION;  Surgeon: Kathryne Hitch, MD;  Location: [facility];  Service: Orthopedics;  Laterality: Left;   SHOULDER ARTHROSCOPY WITH ROTATOR CUFF REPAIR AND SUBACROMIAL DECOMPRESSION Left 11/19/2014   Procedure: LEFT SHOULDER ARTHROSCOPY, DEBRIDEMENT DISTAL CLAVICLE EXCISION, ACROMIOPLASTY WITH ROTATOR CUFF REPAIR ;  Surgeon: Kathryne Hitch, MD;  Location: [facility];  Service: Orthopedics;  Laterality: Left;   THROMBECTOMY OF BYPASS GRAFT FEMORAL- POPLITEAL ARTERY Left 05/01/2020   Procedure: THROMBECTOMY OF LOWER EXTREMITY;  Surgeon: Waynetta Sandy, MD;  Location: [facility];  Service: Vascular;  Laterality: Left;   TRANSFORAMINAL LUMBAR INTERBODY FUSION (TLIF) WITH PEDICLE SCREW FIXATION 1 LEVEL N/A 01/03/2018   Procedure: TRANSFORAMINAL LUMBAR INTERBODY FUSION (TLIF) LUMBAR FIVE-SACRAL ONE;  Surgeon: Melina Schools, MD;   Location: [facility];  Service: Orthopedics;  Laterality: N/A;   ULTRASOUND GUIDANCE FOR VASCULAR ACCESS Right 01/22/2020   Procedure: ULTRASOUND GUIDANCE FOR VASCULAR ACCESS Right Common Femoral Artery.;  Surgeon: Marty Heck, MD;  Location: [facility];  Service: Vascular;  Laterality: Right;   VIDEO ASSISTED THORACOSCOPY (VATS)/DECORTICATION Right 11/21/2011   drainage of empyema    There were no vitals filed for this visit.   Subjective Assessment - 07/22/21 1446     Subjective Pt reports that he saw internal medicine and then had a scare as the ultrasound was not picking up bloodflow in right leg. He went to his vascular doctor and everything checked out fine. He reports that he started on prednisone taper pack on Monday for 2 weeks and seems to be helping with some with the hand and swelling.    Patient is accompained by: --   friend, Don   Pertinent History RA, DM2, HTN, PAD, DDD,  s/p multiple L LE revascularizations discharged from hospital a 05/05/20.  At that time he was deemed to not be a candidate for further revasc procedures.  Dc'd home with a known bypass occlusion with a plan for AKA if worsening pain. Returned to ED 05/08/20  for increased pain and seeking amputation. s/p L AKA 05/11/20    Patient Stated Goals Pt wants  to be able to walk better.    Currently in Pain? Yes    Pain Score 4     Pain Location Hip    Pain Orientation Left;Right    Pain Descriptors / Indicators Aching    Pain Type Acute pain    Pain Onset More than a month ago    Pain Frequency Intermittent                [facility] PT Assessment - 07/22/21 1451       Assessment   Medical Diagnosis Deitra Mayo    Referring Provider (PT) left AKA    Onset Date/Surgical Date --   received prosthesis April 2022                          [facility] Adult PT Treatment/Exercise - 07/22/21 1451       Transfers   Transfers Sit to Stand;Stand to Sit    Sit to Stand 5: Supervision;With upper  extremity assist    Stand to Sit 5: Supervision    Stand to Sit Details (indicate cue type and reason) Verbal cues for technique    Stand to Sit Details Reminder to kick out prosthesis some as locked out      Ambulation/Gait   Ambulation/Gait Yes    Ambulation/Gait Assistance 5: Supervision;4: Min guard    Ambulation/Gait Assistance Details Pt was having some issues with prosthesis internally rotating. Readjusted betwen bouts. Pt reporting he ran out of antiperspirant and PT feels this is contributing. He will be getting more. He declined to fully remove due to time constraints. Verbal cues for upright posture with gait.    Ambulation Distance (Feet) 115 Feet   x 2   Assistive device Rolling walker;Prosthesis    Gait Pattern Step-through pattern;Decreased stance time - left;Decreased weight shift to left    Ambulation Surface Level;Indoor    Gait velocity 44.45 sec=0.27ms      Standardized Balance Assessment   Standardized Balance Assessment Berg Balance Test      Berg Balance Test   Sit to Stand Able to stand  independently using hands    Standing Unsupported Able to stand 2 minutes with supervision    Sitting with Back Unsupported but Feet Supported on Floor or Stool Able to sit safely and securely 2 minutes    Stand to Sit Controls descent by using hands    Transfers Able to transfer safely, definite need of hands    Standing Unsupported with Eyes Closed Able to stand 10 seconds with supervision    Standing Ubsupported with Feet Together Needs help to attain position and unable to hold for 15 seconds    From Standing, Reach Forward with Outstretched Arm Can reach forward >12 cm safely (5")    From Standing Position, Pick up Object from Floor Unable to pick up and needs supervision    From Standing Position, Turn to Look Behind Over each Shoulder Needs supervision when turning    Turn 360 Degrees Needs assistance while turning    Standing Unsupported, Alternately Place Feet on  Step/Stool Able to complete >2 steps/needs minimal assist    Standing Unsupported, One Foot in FONEOKbalance while stepping or standing    Standing on One Leg Unable to try or needs assist to prevent fall    Total Score 25      Prosthetics   Prosthetic Care Comments  Pt reports that he ran out of his antiperspirant  which was really helping with moisture and will be getting more.    Current prosthetic wear tolerance (days/week)  daily                     PT Education - 07/23/21 1055     Education Details Results of testing and recert plan    Person(s) Educated Patient    Methods Explanation    Comprehension Verbalized understanding              PT Short Term Goals - 07/08/21 1659       PT SHORT TERM GOAL #1   Title Pt will be able to tolerate wearing prosthesis all awake hours for improved function.    Baseline 06/24/21 only wearing 3-4 hours/day    Status Not Met      PT SHORT TERM GOAL #2   Title Pt will be independent with prosthetic management for improved function.    Baseline 06/24/21 currently needs assistance with sock ply adjustment    Status Not Met      PT SHORT TERM GOAL #3   Title Pt will be able to ambulate 200' with RW initiating prosthetic knee flexion on level surfaces supervision for improved household mobility.    Baseline 48' min assist/CGA with RW and prosthesis locked out. 06/24/21 115' with RW with prosthesis locked out CGA.    Status On-going      PT SHORT TERM GOAL #4   Title Pt will decrease 5 x sit to stand to </= 18 sec for improved balance and functional mobility.    Baseline 07/08/21: 28.32 secs with UE support from chair, improved from 29.56 just not to goal    Status Not Met    Target Date --      PT SHORT TERM GOAL #5   Title Berg Balance will be assessed and LTG written    Baseline 06/13/21 18/56    Time --    Period --    Status Achieved    Target Date --               PT Long Term Goals - 07/22/21 1449        PT LONG TERM GOAL #1   Title Pt will be independent with HEP for strengthening, ROM and balance to continue gains on own.    Baseline 07/22/21 Pt reports that exercises are going well. PT will continue to update as needed.    Time 12    Period Weeks    Status Achieved    Target Date 07/22/21      PT LONG TERM GOAL #2   Title Pt will increase gait speed to >0.11ms for improved short community distances.    Baseline 04/27/21 0.178m. 07/22/21 0.2281m   Time 12    Period Weeks    Status Not Met    Target Date 07/22/21      PT LONG TERM GOAL #3   Title Pt will ambulate >400' with walker on varied level surfaces mod I for short community distances.    Baseline 04/27/21 75' min assit with RW. 07/22/21 115' with RW CGA    Time 12    Period Weeks    Status Not Met    Target Date 07/22/21      PT LONG TERM GOAL #4   Title Pt will ambulate up/down 4 steps with rails mod I for improved community access.    Baseline unable    Time 12  Period Weeks    Status Deferred    Target Date 07/22/21      PT LONG TERM GOAL #5   Title Pt will increase Berg score from 18 to >30/56 for improved balance and decreased fall risk.    Baseline 06/13/21 18/56. 07/22/21 25/56    Time 12    Period Weeks    Status Not Met    Target Date 07/22/21            Updated PT goals:  PT Short Term Goals - 07/23/21 1105       PT SHORT TERM GOAL #1   Title Pt will tolerate wearing prosthesis at least 6 hours/day.    Baseline 07/22/21 currently 3-4 hours/day    Time 4    Period Weeks    Status New    Target Date 08/20/21      PT SHORT TERM GOAL #2   Title Pt will be independent with prosthetic management for improved function.    Baseline 06/24/21 currently needs assistance with sock ply adjustment    Time 4    Period Weeks    Status On-going    Target Date 08/20/21      PT SHORT TERM GOAL #3   Title Pt will be able to ambulate 200' with RW with prosthetis locked out on level surfaces  supervision for improved household mobility.    Baseline 07/22/21 115' with RW with prosthesis locked out CGA.    Time 4    Period Weeks    Status Revised    Target Date 08/20/21      PT SHORT TERM GOAL #4   Title Pt will increase Berg to >28/56 for improved balance.    Baseline 07/22/21 25/56    Time 4    Period Weeks    Status New    Target Date 08/20/21             PT Long Term Goals - 07/23/21 1113       PT LONG TERM GOAL #1   Title Pt will be independent with progressive HEP for strengthening and balance to continue gains on own.(LTGs due 09/16/21)    Time 8    Period Weeks    Status Revised    Target Date 09/16/21      PT LONG TERM GOAL #2   Title Pt will increase gait speed to >0.39ms for improved household mobility    Baseline 04/27/21 0.133m. 07/22/21 0.2243m   Time 8    Period Weeks    Status Revised    Target Date 09/16/21      PT LONG TERM GOAL #3   Title Pt will be able to ambulate 115' mod I with RW with prosthesis locked versus unlocked if safe for improved household mobility.    Baseline 04/27/21 75' min assit with RW. 07/22/21 115' with RW CGA locked out    Time 8    Period Weeks    Status New    Target Date 09/16/21      PT LONG TERM GOAL #4   Title Pt will ambulate up/down 4 steps with rails, up/down curb and ramp, mod I for improved community access with prosthesis locked out.    Baseline unable    Time 8    Period Weeks    Status On-going    Target Date 09/16/21                    Plan -  07/23/21 1058     Clinical Impression Statement PT asssessed LTGs today. While pt has only fully achieved 1 LTG, he is showing progress towards all others. He achieved HEP goal at this time and PT will continue to progress. Pt has increased his Berg Balance score to 25/56 which is significant change but not to goal and still fall risk. Pt increased gait speed to 0.40ms but short of goal. This still indicates decreased safety with household moiblity.  He is curently ambulating up to 177 with RW CGA with prosthesis locked out for safety. Has shown significant improvement in being able to maintain more upright posture and advance over prosthesis. Pt has been limited by pain in arms and hips with history of RA. Pt will continue to benefit from skilled PT to continue to progress in his prosthetic training.    Personal Factors and Comorbidities Comorbidity 3+;Time since onset of injury/illness/exacerbation    Comorbidities RA, DM2, HTN, PAD, DDD,  s/p multiple L LE revascularizations discharged from hospital a 05/05/20.  At that time he was deemed to not be a candidate for further revasc procedures.  Dc'd home with a known bypass occlusion with a plan for AKA if worsening pain. Returned to ED 05/08/20  for increased pain and seeking amputation. s/p L AKA 05/11/20. Pt reports 3 shoulder surgeries on each shoulder and low back surgery    Examination-Activity Limitations Bathing;Locomotion Level;Transfers;Stairs;Stand;Squat;Lift    Examination-Participation Restrictions Church;Meal Prep    Stability/Clinical Decision Making Evolving/Moderate complexity    Rehab Potential Good    PT Frequency 2x / week    PT Duration 8 weeks    PT Treatment/Interventions ADLs/Self Care Home Management;DME Instruction;Gait training;Stair training;Functional mobility training;Therapeutic activities;Therapeutic exercise;Balance training;Neuromuscular re-education;Manual techniques;Passive range of motion;Prosthetic Training;Patient/family education;Vestibular    PT Next Visit Plan Continue proper sock adjustment education. Half sock to provide more secure fit distally.How is wear time going? Continue standing balance and working on upright posture.  GAIT WITH PROSTHESIS LOCKED OUT RIGHT NOW as unable to lock on own, work on weight shifting over prosthesis. May reattempt unlcoking prosthesis soon to see if would be possible as he is showing improving posture and weight shift.   Prosthetic education.    Consulted and Agree with Plan of Care Patient;Other (Comment)   friend, DTimmothy Sours           Patient will benefit from skilled therapeutic intervention in order to improve the following deficits and impairments:  Abnormal gait, Decreased mobility, Decreased strength, Impaired flexibility, Decreased range of motion, Decreased knowledge of use of DME, Prosthetic Dependency, Decreased balance, Decreased activity tolerance  Visit Diagnosis: Other abnormalities of gait and mobility  Muscle weakness (generalized)  Unsteadiness on feet  Abnormal posture     Problem List Patient Active Problem List   Diagnosis Date Noted   Paronychia of great toe 01/28/2021   Onychomycosis 01/28/2021   Critical limb ischemia with history of revascularization of same extremity ([place] 04/28/2020   History of COVID-19 05/10/2019   PAD (peripheral artery disease) ([place]    History of critical lower limb ischemia 02/19/2019   S/P lumbar fusion, Lower back pain 01/03/2018   Coronary artery disease involving native coronary artery of native heart with unstable angina pectoris ([place]    Preventative health care 03/13/2017   Transaminitis 12/16/2015   Bilateral shoulder pain 08/20/2012   Tobacco use disorder 08/20/2012   HTN (hypertension) 04/20/2011   HLD (hyperlipidemia) 10/08/2009   Rheumatoid arthritis ([place] 08/11/2009    ERaquel SarnaA  Jerline Pain, PT, DPT, NCS 07/23/2021, 11:04 AM  [facility] [address] [place], [place], [address] Phone: [phone]   Fax:  [phone]  Name: DANNIEL TONES MRN: [id number] Date of Birth: Nov 08, 1960

## 2021-07-25 ENCOUNTER — Encounter: Payer: Self-pay | Admitting: Internal Medicine

## 2021-07-25 DIAGNOSIS — M79603 Pain in arm, unspecified: Secondary | ICD-10-CM | POA: Insufficient documentation

## 2021-07-25 NOTE — Assessment & Plan Note (Addendum)
Patient presented with complaints of cold toes with intermittent pain for several days. No pain today. Reports similar sensation to when he required AKA. He continues to smoke daily. Sensation and movement intact. Slight discoloration of distal great toe and 2 toe. Unable to palpate pulses, unable to find pulses with doppler. Foot was still warm. Given findings in office, contacted vascular surgeon's office to facilitate appointment for later in the week. Refilled Plavix. Recommended patient take his statin as well. Counseled tobacco cessation.

## 2021-07-25 NOTE — Assessment & Plan Note (Signed)
Compliant with Amlodipine 10, chlorthalidone 25. Blood pressure stable today. Continue current management.

## 2021-07-25 NOTE — Assessment & Plan Note (Addendum)
Patient referred to rheum previously but reports transportation limitations as there are none in [place]. He is planning on getting his own vehicle in 2-3 weeks and will subsequently follow up with [place] [facility].  Given short course of prednisone for cervical radiculopathy and to also improve RA symptoms. Recommended he follow up with rheum.

## 2021-07-25 NOTE — Assessment & Plan Note (Addendum)
Patient reports fall with subsequent arm pain two months ago. Endorses weakness in arm, difficulty holding onto walke. Painful, occasional stinging, with intermittent numbness and aching.  He follows with Dr. Primus Bravo for pain management and has been evaluated for arm pain already. Last seen pain management in Dec and was recommended to have MRI.  Denies being able to sense gross touch and having difficulty with fine finger movements.  Decreased grip strength RUE and decreased sensation to whole arm. Given history of RA and degenerative disc disease, we are concerned for possible slipped disc and cervical nerve impingement given his whole arm involvement.  MRI c-spine order placed. Given steroid taper for suspected cervical radiculopathy.

## 2021-07-26 ENCOUNTER — Ambulatory Visit: Payer: Medicare Other

## 2021-07-26 ENCOUNTER — Other Ambulatory Visit: Payer: Self-pay | Admitting: *Deleted

## 2021-07-26 DIAGNOSIS — I739 Peripheral vascular disease, unspecified: Secondary | ICD-10-CM

## 2021-07-26 DIAGNOSIS — I7025 Atherosclerosis of native arteries of other extremities with ulceration: Secondary | ICD-10-CM

## 2021-07-28 NOTE — Progress Notes (Signed)
Internal Medicine Clinic Attending ° °Case discussed with Dr. Gawaluck  At the time of the visit.  We reviewed the resident’s history and exam and pertinent patient test results.  I agree with the assessment, diagnosis, and plan of care documented in the resident’s note.  °

## 2021-07-29 ENCOUNTER — Ambulatory Visit: Payer: Medicare Other | Attending: Vascular Surgery

## 2021-07-29 ENCOUNTER — Other Ambulatory Visit: Payer: Self-pay

## 2021-07-29 DIAGNOSIS — M6281 Muscle weakness (generalized): Secondary | ICD-10-CM | POA: Diagnosis not present

## 2021-07-29 DIAGNOSIS — R2681 Unsteadiness on feet: Secondary | ICD-10-CM | POA: Insufficient documentation

## 2021-07-29 DIAGNOSIS — R293 Abnormal posture: Secondary | ICD-10-CM | POA: Insufficient documentation

## 2021-07-29 DIAGNOSIS — R2689 Other abnormalities of gait and mobility: Secondary | ICD-10-CM | POA: Diagnosis not present

## 2021-07-30 NOTE — Therapy (Signed)
[facility] [address] [place], [place], [address] Phone: [phone]   Fax:  [phone]  Physical Therapy Treatment  Patient Details  Name: [name] [name] MRN: [id number] Date of Birth: [birth year] Referring Provider (PT): left AKA   Encounter Date: 07/29/2021   PT End of Session - 07/29/21 1453     Visit Number 15    Number of Visits 30    Date for PT Re-Evaluation 09/16/21    Authorization Type [employer] medicare with medicaid secondary so 10th visit progress note    PT Start Time 1451    PT Stop Time 1532    PT Time Calculation (min) 41 min    Equipment Utilized During Treatment Gait belt    Activity Tolerance Patient tolerated treatment well;Patient limited by pain    Behavior During Therapy [facility] for tasks assessed/performed             Past Medical History:  Diagnosis Date   Anemia    Anemia 08/06/2012   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   Hypertension    states is borderline on medication; has been on med. x 5-6 yr.   Hypokalemia 05/30/2018   Impingement syndrome of shoulder region 10/2014   left   Insomnia 06/01/2015   Ischemic ulcer of toe of left foot (HCC)    Left shoulder pain 12/16/2015   Low back pain without sciatica 10/29/2009   Lupus ([place])    Peripheral vascular disease (HCC)    Pneumonia    RA (rheumatoid arthritis) ([place])    Rotator cuff tear 11/04/2014    Past Surgical History:  Procedure Laterality Date   ABDOMINAL AORTAGRAM  02/20/2019   ABDOMINAL AORTOGRAM W/LOWER EXTREMITY N/A 02/20/2019   Procedure: ABDOMINAL AORTOGRAM W/LOWER EXTREMITY;  Surgeon: Marty Heck, MD;  Location: [facility];  Service: Cardiovascular;  Laterality: N/A;   ACHILLES TENDON SURGERY Bilateral     AMPUTATION Left 05/11/2020   Procedure: LEFT ABOVE KNEE AMPUTATION;  Surgeon: Angelia Mould, MD;  Location: [facility];  Service: Vascular;  Laterality: Left;   ENDARTERECTOMY POPLITEAL Left 05/01/2020   Procedure: ENDARTERECTOMY POPLITEAL;  Surgeon: Waynetta Sandy, MD;  Location: [facility];  Service: Vascular;  Laterality: Left;   FASCIOTOMY Left 05/01/2020   Procedure: FASCIOTOMY;  Surgeon: Waynetta Sandy, MD;  Location: [facility];  Service: Vascular;  Laterality: Left;   FEMORAL-POPLITEAL BYPASS GRAFT Left 02/26/2019   Procedure: BYPASS GRAFT FEMORAL-POPLITEAL ARTERY LEFT LEG USING 56mm PROPATEN GRAFT;  Surgeon: Serafina Mitchell, MD;  Location: [facility];  Service: Vascular;  Laterality: Left;   INTRAOPERATIVE ARTERIOGRAM Left 01/22/2020   Procedure: INTRA OPERATIVE ARTERIOGRAM with administration of thrombolyics in Left femoral to popliteal bypass.;  Surgeon: Marty Heck, MD;  Location: [facility];  Service: Vascular;  Laterality: Left;   LEFT HEART CATH AND CORONARY ANGIOGRAPHY N/A 03/15/2017   Procedure: LEFT HEART CATH AND CORONARY ANGIOGRAPHY;  Surgeon: Nelva Bush, MD;  Location: [facility];  Service: Cardiovascular;  Laterality: N/A;   LOWER EXTREMITY INTERVENTION Left 04/29/2020   Procedure: LOWER EXTREMITY INTERVENTION- LYSIS;  Surgeon: Marty Heck, MD;  Location: [facility];  Service: Cardiovascular;  Laterality: Left;   OSTEOTOMY AND ULNAR  SHORTENING Right 07/22/2002   PATCH ANGIOPLASTY Left 05/01/2020   Procedure: PATCH ANGIOPLASTY USING HEMASHIELD PLATINUM FINESSE PATCH;  Surgeon: Waynetta Sandy, MD;  Location: [facility];  Service: Vascular;  Laterality: Left;   PERIPHERAL VASCULAR ATHERECTOMY  01/23/2020   Procedure: PERIPHERAL VASCULAR ATHERECTOMY;  Surgeon: Marty Heck, MD;  Location: [facility];  Service: Cardiovascular;;   PERIPHERAL VASCULAR BALLOON ANGIOPLASTY  01/23/2020   Procedure: PERIPHERAL VASCULAR BALLOON  ANGIOPLASTY;  Surgeon: Marty Heck, MD;  Location: [facility];  Service: Cardiovascular;;   PERIPHERAL VASCULAR INTERVENTION Left 04/30/2020   Procedure: PERIPHERAL VASCULAR INTERVENTION;  Surgeon: Cherre Robins, MD;  Location: [facility];  Service: Cardiovascular;  Laterality: Left;   PERIPHERAL VASCULAR THROMBECTOMY N/A 01/23/2020   Procedure: lysis recheck;  Surgeon: Marty Heck, MD;  Location: [facility];  Service: Cardiovascular;  Laterality: N/A;  + [place]    PERIPHERAL VASCULAR THROMBECTOMY N/A 04/30/2020   Procedure: Lysis Recheck;  Surgeon: Cherre Robins, MD;  Location: [facility];  Service: Cardiovascular;  Laterality: N/A;   SHOULDER ARTHROSCOPY WITH BICEPSTENOTOMY Right 03/11/2015   Procedure: SHOULDER ARTHROSCOPY WITH BICEPSTENOTOMY;  Surgeon: Ninetta Lights, MD;  Location: [facility];  Service: Orthopedics;  Laterality: Right;   SHOULDER ARTHROSCOPY WITH DISTAL CLAVICLE RESECTION Left 11/19/2014   Procedure: SHOULDER ARTHROSCOPY WITH DISTAL CLAVICLE RESECTION;  Surgeon: Kathryne Hitch, MD;  Location: [facility];  Service: Orthopedics;  Laterality: Left;   SHOULDER ARTHROSCOPY WITH ROTATOR CUFF REPAIR AND SUBACROMIAL DECOMPRESSION Left 11/19/2014   Procedure: LEFT SHOULDER ARTHROSCOPY, DEBRIDEMENT DISTAL CLAVICLE EXCISION, ACROMIOPLASTY WITH ROTATOR CUFF REPAIR ;  Surgeon: Kathryne Hitch, MD;  Location: [facility];  Service: Orthopedics;  Laterality: Left;   THROMBECTOMY OF BYPASS GRAFT FEMORAL- POPLITEAL ARTERY Left 05/01/2020   Procedure: THROMBECTOMY OF LOWER EXTREMITY;  Surgeon: Waynetta Sandy, MD;  Location: [facility];  Service: Vascular;  Laterality: Left;   TRANSFORAMINAL LUMBAR INTERBODY FUSION (TLIF) WITH PEDICLE SCREW FIXATION 1 LEVEL N/A 01/03/2018   Procedure: TRANSFORAMINAL LUMBAR INTERBODY FUSION (TLIF) LUMBAR FIVE-SACRAL ONE;  Surgeon: Melina Schools, MD;  Location: [facility];   Service: Orthopedics;  Laterality: N/A;   ULTRASOUND GUIDANCE FOR VASCULAR ACCESS Right 01/22/2020   Procedure: ULTRASOUND GUIDANCE FOR VASCULAR ACCESS Right Common Femoral Artery.;  Surgeon: Marty Heck, MD;  Location: [facility];  Service: Vascular;  Laterality: Right;   VIDEO ASSISTED THORACOSCOPY (VATS)/DECORTICATION Right 11/21/2011   drainage of empyema    There were no vitals filed for this visit.   Subjective Assessment - 07/29/21 1453     Subjective Pt reports that he has not heard anything on the cervical MRI. Pt is still on the prednisone.    Patient is accompained by: --   friend, [name] [name]   Pertinent History RA, DM2, HTN, PAD, DDD,  s/p multiple L LE revascularizations discharged from hospital a 05/05/20.  At that time he was deemed to not be a candidate for further revasc procedures.  Dc'd home with a known bypass occlusion with a plan for AKA if worsening pain. Returned to ED 05/08/20  for increased pain and seeking amputation. s/p L AKA 05/11/20    Patient Stated Goals Pt wants to be able to walk better.    Currently in Pain? Yes    Pain Score 5     Pain Location Hip   and shoulders   Pain Orientation Right;Left    Pain Descriptors / Indicators  Aching;Sore    Pain Type Chronic pain    Pain Onset More than a month ago    Pain Frequency Intermittent                               [facility] Adult PT Treatment/Exercise - 07/29/21 1456       Transfers   Transfers Sit to Stand;Stand to Sit    Sit to Stand 5: Supervision    Stand to Sit 5: Supervision    Stand to Sit Details (indicate cue type and reason) Verbal cues for technique    Stand to Sit Details Cued to kick out prosthesis some prior to sitting since locked out      Ambulation/Gait   Ambulation/Gait Yes    Ambulation/Gait Assistance 5: Supervision;4: Min guard    Ambulation/Gait Assistance Details Prosthesis locked out. Verbal and tactile cues for erect posture and to try to relax shoulders some.  At end pt reported feeling swimmy headed. BP assessed and was 130/70, HR=76    Ambulation Distance (Feet) 115 Feet    Assistive device Rolling walker    Gait Pattern Step-through pattern;Decreased stance time - left;Decreased weight shift to left    Ambulation Surface Level;Indoor      Neuro Re-ed    Neuro Re-ed Details  Standing at walker without UE support x 30 sec eyes open, x 30 sec eyes closed CGA with slight increased sway. Standing with looking over shoulder x 10 each way. Seated rest breaks between activities. Gait in // bars with 1 UE support 8' x 6 then 8' x 4 with verbal and tactile cues to shift weight over prosthesis and stay up tall increasing right step length. Pt improved as went on. Denied any more dizziness.                       PT Short Term Goals - 07/23/21 1105       PT SHORT TERM GOAL #1   Title Pt will tolerate wearing prosthesis at least 6 hours/day.    Baseline 07/22/21 currently 3-4 hours/day    Time 4    Period Weeks    Status New    Target Date 08/20/21      PT SHORT TERM GOAL #2   Title Pt will be independent with prosthetic management for improved function.    Baseline 06/24/21 currently needs assistance with sock ply adjustment    Time 4    Period Weeks    Status On-going    Target Date 08/20/21      PT SHORT TERM GOAL #3   Title Pt will be able to ambulate 200' with RW with prosthetis locked out on level surfaces supervision for improved household mobility.    Baseline 07/22/21 115' with RW with prosthesis locked out CGA.    Time 4    Period Weeks    Status Revised    Target Date 08/20/21      PT SHORT TERM GOAL #4   Title Pt will increase Berg to >28/56 for improved balance.    Baseline 07/22/21 25/56    Time 4    Period Weeks    Status New    Target Date 08/20/21               PT Long Term Goals - 07/23/21 1113       PT LONG TERM GOAL #1   Title Pt  will be independent with progressive HEP for strengthening and  balance to continue gains on own.(LTGs due 09/16/21)    Time 8    Period Weeks    Status Revised    Target Date 09/16/21      PT LONG TERM GOAL #2   Title Pt will increase gait speed to >0.72m/s for improved household mobility    Baseline 04/27/21 0.45m/s. 07/22/21 0.48m/s    Time 8    Period Weeks    Status Revised    Target Date 09/16/21      PT LONG TERM GOAL #3   Title Pt will be able to ambulate 115' mod I with RW with prosthesis locked versus unlocked if safe for improved household mobility.    Baseline 04/27/21 75' min assit with RW. 07/22/21 115' with RW CGA locked out    Time 8    Period Weeks    Status New    Target Date 09/16/21      PT LONG TERM GOAL #4   Title Pt will ambulate up/down 4 steps with rails, up/down curb and ramp, mod I for improved community access with prosthesis locked out.    Baseline unable    Time 8    Period Weeks    Status On-going    Target Date 09/16/21                   Plan - 07/30/21 1419     Clinical Impression Statement PT started trying to decrease UE support some in // bars today. Pt initially getting off prosthesis fast and note shifting over but did improve some as went on.    Personal Factors and Comorbidities Comorbidity 3+;Time since onset of injury/illness/exacerbation    Comorbidities RA, DM2, HTN, PAD, DDD,  s/p multiple L LE revascularizations discharged from hospital a 05/05/20.  At that time he was deemed to not be a candidate for further revasc procedures.  Dc'd home with a known bypass occlusion with a plan for AKA if worsening pain. Returned to ED 05/08/20  for increased pain and seeking amputation. s/p L AKA 05/11/20. Pt reports 3 shoulder surgeries on each shoulder and low back surgery    Examination-Activity Limitations Bathing;Locomotion Level;Transfers;Stairs;Stand;Squat;Lift    Examination-Participation Restrictions Church;Meal Prep    Stability/Clinical Decision Making Evolving/Moderate complexity    Rehab  Potential Good    PT Frequency 2x / week    PT Duration 8 weeks    PT Treatment/Interventions ADLs/Self Care Home Management;DME Instruction;Gait training;Stair training;Functional mobility training;Therapeutic activities;Therapeutic exercise;Balance training;Neuromuscular re-education;Manual techniques;Passive range of motion;Prosthetic Training;Patient/family education;Vestibular    PT Next Visit Plan Continue proper sock adjustment education. Half sock to provide more secure fit distally.How is wear time going? Continue standing balance and working on upright posture.  GAIT WITH PROSTHESIS LOCKED OUT RIGHT NOW as unable to lock on own, work on weight shifting over prosthesis. May reattempt unlcoking prosthesis soon to see if would be possible as he is showing improving posture and weight shift.  Prosthetic education.    Consulted and Agree with Plan of Care Patient;Other (Comment)   friend, [name] [name]            Patient will benefit from skilled therapeutic intervention in order to improve the following deficits and impairments:  Abnormal gait, Decreased mobility, Decreased strength, Impaired flexibility, Decreased range of motion, Decreased knowledge of use of DME, Prosthetic Dependency, Decreased balance, Decreased activity tolerance  Visit Diagnosis: Other abnormalities of gait and mobility  Muscle weakness (  generalized)  Unsteadiness on feet     Problem List Patient Active Problem List   Diagnosis Date Noted   Arm pain 07/25/2021   Paronychia of great toe 01/28/2021   Onychomycosis 01/28/2021   Critical limb ischemia with history of revascularization of same extremity ([place]) 04/28/2020   History of COVID-19 05/10/2019   PAD (peripheral artery disease) ([place])    History of critical lower limb ischemia 02/19/2019   S/P lumbar fusion, Lower back pain 01/03/2018   Coronary artery disease involving native coronary artery of native heart with unstable angina pectoris [facility])     Preventative health care 03/13/2017   Transaminitis 12/16/2015   Bilateral shoulder pain 08/20/2012   Tobacco use disorder 08/20/2012   HTN (hypertension) 04/20/2011   HLD (hyperlipidemia) 10/08/2009   Rheumatoid arthritis ([place]) 08/11/2009    Electa Sniff, PT, DPT, NCS 07/30/2021, 2:20 PM  [facility] [address] [place], [place], [address] Phone: [phone]   Fax:  [phone]  Name: [name] [name] MRN: [id number] Date of Birth: 10/04/1960

## 2021-08-02 ENCOUNTER — Other Ambulatory Visit: Payer: Self-pay

## 2021-08-02 ENCOUNTER — Ambulatory Visit: Payer: Medicare Other

## 2021-08-02 DIAGNOSIS — M6281 Muscle weakness (generalized): Secondary | ICD-10-CM

## 2021-08-02 DIAGNOSIS — R2681 Unsteadiness on feet: Secondary | ICD-10-CM

## 2021-08-02 DIAGNOSIS — R293 Abnormal posture: Secondary | ICD-10-CM | POA: Diagnosis not present

## 2021-08-02 DIAGNOSIS — R2689 Other abnormalities of gait and mobility: Secondary | ICD-10-CM | POA: Diagnosis not present

## 2021-08-02 DIAGNOSIS — M5136 Other intervertebral disc degeneration, lumbar region: Secondary | ICD-10-CM | POA: Diagnosis not present

## 2021-08-02 DIAGNOSIS — G8929 Other chronic pain: Secondary | ICD-10-CM | POA: Diagnosis not present

## 2021-08-02 DIAGNOSIS — M069 Rheumatoid arthritis, unspecified: Secondary | ICD-10-CM | POA: Diagnosis not present

## 2021-08-02 DIAGNOSIS — G894 Chronic pain syndrome: Secondary | ICD-10-CM | POA: Diagnosis not present

## 2021-08-02 NOTE — Therapy (Signed)
[facility] [address] [place], [place], [address] Phone: [phone]   Fax:  [phone]  Physical Therapy Treatment  Patient Details  Name: [name] [name] MRN: [id number] Date of Birth: June 23, 1961 Referring Provider (PT): left AKA   Encounter Date: 08/02/2021   PT End of Session - 08/02/21 1449     Visit Number 16    Number of Visits 30    Date for PT Re-Evaluation 09/16/21    Authorization Type UHC medicare with medicaid secondary so 10th visit progress note    PT Start Time 1446    PT Stop Time 1525    PT Time Calculation (min) 39 min    Equipment Utilized During Treatment Gait belt    Activity Tolerance Patient tolerated treatment well;Patient limited by pain    Behavior During Therapy [facility] for tasks assessed/performed             Past Medical History:  Diagnosis Date   Anemia    Anemia 08/06/2012   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   Hypertension    states is borderline on medication; has been on med. x 5-6 yr.   Hypokalemia 05/30/2018   Impingement syndrome of shoulder region 10/2014   left   Insomnia 06/01/2015   Ischemic ulcer of toe of left foot (HCC)    Left shoulder pain 12/16/2015   Low back pain without sciatica 10/29/2009   Lupus ([place])    Peripheral vascular disease (HCC)    Pneumonia    RA (rheumatoid arthritis) ([place])    Rotator cuff tear 11/04/2014    Past Surgical History:  Procedure Laterality Date   ABDOMINAL AORTAGRAM  02/20/2019   ABDOMINAL AORTOGRAM W/LOWER EXTREMITY N/A 02/20/2019   Procedure: ABDOMINAL AORTOGRAM W/LOWER EXTREMITY;  Surgeon: Marty Heck, MD;  Location: [facility];  Service: Cardiovascular;  Laterality: N/A;   ACHILLES TENDON SURGERY Bilateral     AMPUTATION Left 05/11/2020   Procedure: LEFT ABOVE KNEE AMPUTATION;  Surgeon: Angelia Mould, MD;  Location: [facility];  Service: Vascular;  Laterality: Left;   ENDARTERECTOMY POPLITEAL Left 05/01/2020   Procedure: ENDARTERECTOMY POPLITEAL;  Surgeon: Waynetta Sandy, MD;  Location: [facility];  Service: Vascular;  Laterality: Left;   FASCIOTOMY Left 05/01/2020   Procedure: FASCIOTOMY;  Surgeon: Waynetta Sandy, MD;  Location: [facility];  Service: Vascular;  Laterality: Left;   FEMORAL-POPLITEAL BYPASS GRAFT Left 02/26/2019   Procedure: BYPASS GRAFT FEMORAL-POPLITEAL ARTERY LEFT LEG USING 65mm PROPATEN GRAFT;  Surgeon: Serafina Mitchell, MD;  Location: [facility];  Service: Vascular;  Laterality: Left;   INTRAOPERATIVE ARTERIOGRAM Left 01/22/2020   Procedure: INTRA OPERATIVE ARTERIOGRAM with administration of thrombolyics in Left femoral to popliteal bypass.;  Surgeon: Marty Heck, MD;  Location: [facility];  Service: Vascular;  Laterality: Left;   LEFT HEART CATH AND CORONARY ANGIOGRAPHY N/A 03/15/2017   Procedure: LEFT HEART CATH AND CORONARY ANGIOGRAPHY;  Surgeon: Nelva Bush, MD;  Location: [facility];  Service: Cardiovascular;  Laterality: N/A;   LOWER EXTREMITY INTERVENTION Left 04/29/2020   Procedure: LOWER EXTREMITY INTERVENTION- LYSIS;  Surgeon: Marty Heck, MD;  Location: [facility];  Service: Cardiovascular;  Laterality: Left;   OSTEOTOMY AND ULNAR  SHORTENING Right 07/22/2002   PATCH ANGIOPLASTY Left 05/01/2020   Procedure: PATCH ANGIOPLASTY USING HEMASHIELD PLATINUM FINESSE PATCH;  Surgeon: Waynetta Sandy, MD;  Location: [facility];  Service: Vascular;  Laterality: Left;   PERIPHERAL VASCULAR ATHERECTOMY  01/23/2020   Procedure: PERIPHERAL VASCULAR ATHERECTOMY;  Surgeon: Marty Heck, MD;  Location: [facility];  Service: Cardiovascular;;   PERIPHERAL VASCULAR BALLOON ANGIOPLASTY  01/23/2020   Procedure: PERIPHERAL VASCULAR BALLOON  ANGIOPLASTY;  Surgeon: Marty Heck, MD;  Location: [facility];  Service: Cardiovascular;;   PERIPHERAL VASCULAR INTERVENTION Left 04/30/2020   Procedure: PERIPHERAL VASCULAR INTERVENTION;  Surgeon: Cherre Robins, MD;  Location: [facility];  Service: Cardiovascular;  Laterality: Left;   PERIPHERAL VASCULAR THROMBECTOMY N/A 01/23/2020   Procedure: lysis recheck;  Surgeon: Marty Heck, MD;  Location: [facility];  Service: Cardiovascular;  Laterality: N/A;  + [place]    PERIPHERAL VASCULAR THROMBECTOMY N/A 04/30/2020   Procedure: Lysis Recheck;  Surgeon: Cherre Robins, MD;  Location: [facility];  Service: Cardiovascular;  Laterality: N/A;   SHOULDER ARTHROSCOPY WITH BICEPSTENOTOMY Right 03/11/2015   Procedure: SHOULDER ARTHROSCOPY WITH BICEPSTENOTOMY;  Surgeon: Ninetta Lights, MD;  Location: [facility];  Service: Orthopedics;  Laterality: Right;   SHOULDER ARTHROSCOPY WITH DISTAL CLAVICLE RESECTION Left 11/19/2014   Procedure: SHOULDER ARTHROSCOPY WITH DISTAL CLAVICLE RESECTION;  Surgeon: Kathryne Hitch, MD;  Location: [facility];  Service: Orthopedics;  Laterality: Left;   SHOULDER ARTHROSCOPY WITH ROTATOR CUFF REPAIR AND SUBACROMIAL DECOMPRESSION Left 11/19/2014   Procedure: LEFT SHOULDER ARTHROSCOPY, DEBRIDEMENT DISTAL CLAVICLE EXCISION, ACROMIOPLASTY WITH ROTATOR CUFF REPAIR ;  Surgeon: Kathryne Hitch, MD;  Location: [facility];  Service: Orthopedics;  Laterality: Left;   THROMBECTOMY OF BYPASS GRAFT FEMORAL- POPLITEAL ARTERY Left 05/01/2020   Procedure: THROMBECTOMY OF LOWER EXTREMITY;  Surgeon: Waynetta Sandy, MD;  Location: [facility];  Service: Vascular;  Laterality: Left;   TRANSFORAMINAL LUMBAR INTERBODY FUSION (TLIF) WITH PEDICLE SCREW FIXATION 1 LEVEL N/A 01/03/2018   Procedure: TRANSFORAMINAL LUMBAR INTERBODY FUSION (TLIF) LUMBAR FIVE-SACRAL ONE;  Surgeon: Melina Schools, MD;  Location: [facility];   Service: Orthopedics;  Laterality: N/A;   ULTRASOUND GUIDANCE FOR VASCULAR ACCESS Right 01/22/2020   Procedure: ULTRASOUND GUIDANCE FOR VASCULAR ACCESS Right Common Femoral Artery.;  Surgeon: Marty Heck, MD;  Location: [facility];  Service: Vascular;  Laterality: Right;   VIDEO ASSISTED THORACOSCOPY (VATS)/DECORTICATION Right 11/21/2011   drainage of empyema    There were no vitals filed for this visit.   Subjective Assessment - 08/02/21 1451     Subjective Pt reports his left hip is a little sore today. Was on bus a lot and had to go to pain clinic. No changes. They were wondering if left leg was slightly longer than right but pt feels is best it's ever felt.    Patient is accompained by: --   friend, Don   Pertinent History RA, DM2, HTN, PAD, DDD,  s/p multiple L LE revascularizations discharged from hospital a 05/05/20.  At that time he was deemed to not be a candidate for further revasc procedures.  Dc'd home with a known bypass occlusion with a plan for AKA if worsening pain. Returned to ED 05/08/20  for increased pain and seeking amputation. s/p L AKA 05/11/20    Patient Stated Goals Pt wants to be able to walk better.    Currently in Pain? Yes    Pain  Score 5     Pain Location Hip    Pain Orientation Left    Pain Descriptors / Indicators Aching;Sore    Pain Type Chronic pain    Pain Onset More than a month ago    Pain Frequency Intermittent                               [facility] Adult PT Treatment/Exercise - 08/02/21 1453       Transfers   Transfers Sit to Stand;Stand to Sit    Sit to Stand 5: Supervision;With upper extremity assist    Stand to Sit 5: Supervision      Ambulation/Gait   Ambulation/Gait Yes    Ambulation/Gait Assistance 5: Supervision;4: Min guard    Ambulation/Gait Assistance Details Verbal cues to try to stay up tall with good weight shift over prosthesis. Trialed unlocking knee with 2nd short bout but still unable to get prosthesis to  lock on own with mod assist of PT and heavy UE support from pt with w/c follow. During last gait bout PT helped to facilitate some left anterior trunk rotation.    Ambulation Distance (Feet) 230 Feet   10' with knee unlocked with w/c follow, 115' more with knee locked   Assistive device Rolling walker;Prosthesis    Gait Pattern Step-through pattern    Ambulation Surface Level;Indoor    Gait Comments At counter with RUE support walking forwards and back 8' x 4 with light HHA on left and verbal and tactile cues to stand up tall and shift over left leg to slow down right step. Standing in place with LLE in front stepping forwards and back x 6      Prosthetics   Prosthetic Care Comments  Pt reports that skin has been doing good. He has been using the antiperspirant that is helping a lot with sweat management. Pt is wearing the 8 ply socks still with the cut off one on the bottom.    Current prosthetic wear tolerance (days/week)  daily    Current prosthetic wear tolerance (#hours/day)  6 hours/day    Residual limb condition  Pt reports that skin is intact. He checks daily and washing well.                       PT Short Term Goals - 07/23/21 1105       PT SHORT TERM GOAL #1   Title Pt will tolerate wearing prosthesis at least 6 hours/day.    Baseline 07/22/21 currently 3-4 hours/day    Time 4    Period Weeks    Status New    Target Date 08/20/21      PT SHORT TERM GOAL #2   Title Pt will be independent with prosthetic management for improved function.    Baseline 06/24/21 currently needs assistance with sock ply adjustment    Time 4    Period Weeks    Status On-going    Target Date 08/20/21      PT SHORT TERM GOAL #3   Title Pt will be able to ambulate 200' with RW with prosthetis locked out on level surfaces supervision for improved household mobility.    Baseline 07/22/21 115' with RW with prosthesis locked out CGA.    Time 4    Period Weeks    Status Revised    Target  Date 08/20/21      PT SHORT TERM  GOAL #4   Title Pt will increase Berg to >28/56 for improved balance.    Baseline 07/22/21 25/56    Time 4    Period Weeks    Status New    Target Date 08/20/21               PT Long Term Goals - 07/23/21 1113       PT LONG TERM GOAL #1   Title Pt will be independent with progressive HEP for strengthening and balance to continue gains on own.(LTGs due 09/16/21)    Time 8    Period Weeks    Status Revised    Target Date 09/16/21      PT LONG TERM GOAL #2   Title Pt will increase gait speed to >0.22m/s for improved household mobility    Baseline 04/27/21 0.84m/s. 07/22/21 0.67m/s    Time 8    Period Weeks    Status Revised    Target Date 09/16/21      PT LONG TERM GOAL #3   Title Pt will be able to ambulate 115' mod I with RW with prosthesis locked versus unlocked if safe for improved household mobility.    Baseline 04/27/21 75' min assit with RW. 07/22/21 115' with RW CGA locked out    Time 8    Period Weeks    Status New    Target Date 09/16/21      PT LONG TERM GOAL #4   Title Pt will ambulate up/down 4 steps with rails, up/down curb and ramp, mod I for improved community access with prosthesis locked out.    Baseline unable    Time 8    Period Weeks    Status On-going    Target Date 09/16/21                   Plan - 08/02/21 1847     Clinical Impression Statement Pt was able to increase gait distance today. PT trialed unlocking prosthesis for a short bit but pt unable to lock knee still to be safe with this. Knee is difficult to fully lock and may need to see prosthetist in future if going to work on more. His pelvis is still not rotating fully anterior over prosthesis with less hip extension but overall posture is much more upright.    Personal Factors and Comorbidities Comorbidity 3+;Time since onset of injury/illness/exacerbation    Comorbidities RA, DM2, HTN, PAD, DDD,  s/p multiple L LE revascularizations discharged  from hospital a 05/05/20.  At that time he was deemed to not be a candidate for further revasc procedures.  Dc'd home with a known bypass occlusion with a plan for AKA if worsening pain. Returned to ED 05/08/20  for increased pain and seeking amputation. s/p L AKA 05/11/20. Pt reports 3 shoulder surgeries on each shoulder and low back surgery    Examination-Activity Limitations Bathing;Locomotion Level;Transfers;Stairs;Stand;Squat;Lift    Examination-Participation Restrictions Church;Meal Prep    Stability/Clinical Decision Making Evolving/Moderate complexity    Rehab Potential Good    PT Frequency 2x / week    PT Duration 8 weeks    PT Treatment/Interventions ADLs/Self Care Home Management;DME Instruction;Gait training;Stair training;Functional mobility training;Therapeutic activities;Therapeutic exercise;Balance training;Neuromuscular re-education;Manual techniques;Passive range of motion;Prosthetic Training;Patient/family education;Vestibular    PT Next Visit Plan .How is wear time going? Continue standing balance and working on upright posture.  GAIT WITH PROSTHESIS LOCKED OUT RIGHT NOW as unable to lock on own, work on weight shifting over prosthesis.  Still unable to lock prosthesis if knee unlocked at this time.  Prosthetic education.    Consulted and Agree with Plan of Care Patient;Other (Comment)   friend, Timmothy Sours            Patient will benefit from skilled therapeutic intervention in order to improve the following deficits and impairments:  Abnormal gait, Decreased mobility, Decreased strength, Impaired flexibility, Decreased range of motion, Decreased knowledge of use of DME, Prosthetic Dependency, Decreased balance, Decreased activity tolerance  Visit Diagnosis: Other abnormalities of gait and mobility  Muscle weakness (generalized)  Unsteadiness on feet  Abnormal posture     Problem List Patient Active Problem List   Diagnosis Date Noted   Arm pain 07/25/2021   Paronychia  of great toe 01/28/2021   Onychomycosis 01/28/2021   Critical limb ischemia with history of revascularization of same extremity ([place]) 04/28/2020   History of COVID-19 05/10/2019   PAD (peripheral artery disease) ([place])    History of critical lower limb ischemia 02/19/2019   S/P lumbar fusion, Lower back pain 01/03/2018   Coronary artery disease involving native coronary artery of native heart with unstable angina pectoris [facility])    Preventative health care 03/13/2017   Transaminitis 12/16/2015   Bilateral shoulder pain 08/20/2012   Tobacco use disorder 08/20/2012   HTN (hypertension) 04/20/2011   HLD (hyperlipidemia) 10/08/2009   Rheumatoid arthritis ([place]) 08/11/2009    Electa Sniff, PT, DPT, NCS 08/02/2021, 6:49 PM  [facility] [address] [place], [place], [address] Phone: [phone]   Fax:  [phone]  Name: [name] [name] MRN: [id number] Date of Birth: 1961/05/04

## 2021-08-05 ENCOUNTER — Other Ambulatory Visit: Payer: Self-pay

## 2021-08-05 ENCOUNTER — Ambulatory Visit: Payer: Medicare Other

## 2021-08-05 DIAGNOSIS — M6281 Muscle weakness (generalized): Secondary | ICD-10-CM | POA: Diagnosis not present

## 2021-08-05 DIAGNOSIS — R2681 Unsteadiness on feet: Secondary | ICD-10-CM

## 2021-08-05 DIAGNOSIS — R2689 Other abnormalities of gait and mobility: Secondary | ICD-10-CM

## 2021-08-05 DIAGNOSIS — R293 Abnormal posture: Secondary | ICD-10-CM | POA: Diagnosis not present

## 2021-08-05 NOTE — Patient Instructions (Signed)
Access Code: [email or web address] URL: [email or web address] Date: 08/05/2021 Prepared by: Cherly Anderson  Exercises Prone Hip Flexor Stretch with Towel Roll (AKA) - 2-3 x daily - 7 x weekly - 1 sets - 3 reps - 2 mins hold Supine Hip Flexor Stretch with Weight - 2-3 x daily - 7 x weekly - 1 sets - 3 reps - 1 min hold Sidelying ITB Stretch off Table (Mirrored) - 1 x daily - 7 x weekly - 2 sets - 60 seconds hold Supine Bridge - 1 x daily - 5 x weekly - 1 sets - 10 reps Diagonal Weight Shift with Parallel Bars (AKA) - 1 x daily - 5 x weekly - 2 sets - 10 reps

## 2021-08-05 NOTE — Therapy (Signed)
[facility] [address] [place], [place], [address] Phone: [phone]   Fax:  [phone]  Physical Therapy Treatment  Patient Details  Name: [name] [name] MRN: [id number] Date of Birth: 1960-12-18 Referring Provider (PT): left AKA   Encounter Date: 08/05/2021   PT End of Session - 08/05/21 1451     Visit Number 17    Number of Visits 30    Date for PT Re-Evaluation 09/16/21    Authorization Type UHC medicare with medicaid secondary so 10th visit progress note    PT Start Time 1448    PT Stop Time 1527    PT Time Calculation (min) 39 min    Equipment Utilized During Treatment Gait belt    Activity Tolerance Patient tolerated treatment well;Patient limited by pain    Behavior During Therapy [facility] for tasks assessed/performed             Past Medical History:  Diagnosis Date   Anemia    Anemia 08/06/2012   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   Hypertension    states is borderline on medication; has been on med. x 5-6 yr.   Hypokalemia 05/30/2018   Impingement syndrome of shoulder region 10/2014   left   Insomnia 06/01/2015   Ischemic ulcer of toe of left foot (HCC)    Left shoulder pain 12/16/2015   Low back pain without sciatica 10/29/2009   Lupus ([place])    Peripheral vascular disease (HCC)    Pneumonia    RA (rheumatoid arthritis) ([place])    Rotator cuff tear 11/04/2014    Past Surgical History:  Procedure Laterality Date   ABDOMINAL AORTAGRAM  02/20/2019   ABDOMINAL AORTOGRAM W/LOWER EXTREMITY N/A 02/20/2019   Procedure: ABDOMINAL AORTOGRAM W/LOWER EXTREMITY;  Surgeon: Marty Heck, MD;  Location: [facility];  Service: Cardiovascular;  Laterality: N/A;   ACHILLES TENDON SURGERY Bilateral     AMPUTATION Left 05/11/2020   Procedure: LEFT ABOVE KNEE AMPUTATION;  Surgeon: Angelia Mould, MD;  Location: [facility];  Service: Vascular;  Laterality: Left;   ENDARTERECTOMY POPLITEAL Left 05/01/2020   Procedure: ENDARTERECTOMY POPLITEAL;  Surgeon: Waynetta Sandy, MD;  Location: [facility];  Service: Vascular;  Laterality: Left;   FASCIOTOMY Left 05/01/2020   Procedure: FASCIOTOMY;  Surgeon: Waynetta Sandy, MD;  Location: [facility];  Service: Vascular;  Laterality: Left;   FEMORAL-POPLITEAL BYPASS GRAFT Left 02/26/2019   Procedure: BYPASS GRAFT FEMORAL-POPLITEAL ARTERY LEFT LEG USING 88mm PROPATEN GRAFT;  Surgeon: Serafina Mitchell, MD;  Location: [facility];  Service: Vascular;  Laterality: Left;   INTRAOPERATIVE ARTERIOGRAM Left 01/22/2020   Procedure: INTRA OPERATIVE ARTERIOGRAM with administration of thrombolyics in Left femoral to popliteal bypass.;  Surgeon: Marty Heck, MD;  Location: [facility];  Service: Vascular;  Laterality: Left;   LEFT HEART CATH AND CORONARY ANGIOGRAPHY N/A 03/15/2017   Procedure: LEFT HEART CATH AND CORONARY ANGIOGRAPHY;  Surgeon: Nelva Bush, MD;  Location: [facility];  Service: Cardiovascular;  Laterality: N/A;   LOWER EXTREMITY INTERVENTION Left 04/29/2020   Procedure: LOWER EXTREMITY INTERVENTION- LYSIS;  Surgeon: Marty Heck, MD;  Location: [facility];  Service: Cardiovascular;  Laterality: Left;   OSTEOTOMY AND ULNAR  SHORTENING Right 07/22/2002   PATCH ANGIOPLASTY Left 05/01/2020   Procedure: PATCH ANGIOPLASTY USING HEMASHIELD PLATINUM FINESSE PATCH;  Surgeon: Waynetta Sandy, MD;  Location: [facility];  Service: Vascular;  Laterality: Left;   PERIPHERAL VASCULAR ATHERECTOMY  01/23/2020   Procedure: PERIPHERAL VASCULAR ATHERECTOMY;  Surgeon: Marty Heck, MD;  Location: [facility];  Service: Cardiovascular;;   PERIPHERAL VASCULAR BALLOON ANGIOPLASTY  01/23/2020   Procedure: PERIPHERAL VASCULAR BALLOON  ANGIOPLASTY;  Surgeon: Marty Heck, MD;  Location: [facility];  Service: Cardiovascular;;   PERIPHERAL VASCULAR INTERVENTION Left 04/30/2020   Procedure: PERIPHERAL VASCULAR INTERVENTION;  Surgeon: Cherre Robins, MD;  Location: [facility];  Service: Cardiovascular;  Laterality: Left;   PERIPHERAL VASCULAR THROMBECTOMY N/A 01/23/2020   Procedure: lysis recheck;  Surgeon: Marty Heck, MD;  Location: [facility];  Service: Cardiovascular;  Laterality: N/A;  + [place]    PERIPHERAL VASCULAR THROMBECTOMY N/A 04/30/2020   Procedure: Lysis Recheck;  Surgeon: Cherre Robins, MD;  Location: [facility];  Service: Cardiovascular;  Laterality: N/A;   SHOULDER ARTHROSCOPY WITH BICEPSTENOTOMY Right 03/11/2015   Procedure: SHOULDER ARTHROSCOPY WITH BICEPSTENOTOMY;  Surgeon: Ninetta Lights, MD;  Location: [facility];  Service: Orthopedics;  Laterality: Right;   SHOULDER ARTHROSCOPY WITH DISTAL CLAVICLE RESECTION Left 11/19/2014   Procedure: SHOULDER ARTHROSCOPY WITH DISTAL CLAVICLE RESECTION;  Surgeon: Kathryne Hitch, MD;  Location: [facility];  Service: Orthopedics;  Laterality: Left;   SHOULDER ARTHROSCOPY WITH ROTATOR CUFF REPAIR AND SUBACROMIAL DECOMPRESSION Left 11/19/2014   Procedure: LEFT SHOULDER ARTHROSCOPY, DEBRIDEMENT DISTAL CLAVICLE EXCISION, ACROMIOPLASTY WITH ROTATOR CUFF REPAIR ;  Surgeon: Kathryne Hitch, MD;  Location: [facility];  Service: Orthopedics;  Laterality: Left;   THROMBECTOMY OF BYPASS GRAFT FEMORAL- POPLITEAL ARTERY Left 05/01/2020   Procedure: THROMBECTOMY OF LOWER EXTREMITY;  Surgeon: Waynetta Sandy, MD;  Location: [facility];  Service: Vascular;  Laterality: Left;   TRANSFORAMINAL LUMBAR INTERBODY FUSION (TLIF) WITH PEDICLE SCREW FIXATION 1 LEVEL N/A 01/03/2018   Procedure: TRANSFORAMINAL LUMBAR INTERBODY FUSION (TLIF) LUMBAR FIVE-SACRAL ONE;  Surgeon: Melina Schools, MD;  Location: [facility];   Service: Orthopedics;  Laterality: N/A;   ULTRASOUND GUIDANCE FOR VASCULAR ACCESS Right 01/22/2020   Procedure: ULTRASOUND GUIDANCE FOR VASCULAR ACCESS Right Common Femoral Artery.;  Surgeon: Marty Heck, MD;  Location: [facility];  Service: Vascular;  Laterality: Right;   VIDEO ASSISTED THORACOSCOPY (VATS)/DECORTICATION Right 11/21/2011   drainage of empyema    There were no vitals filed for this visit.   Subjective Assessment - 08/05/21 1452     Subjective Pt denies any new issues. Pt reports that he finally got MRI scheduled for 2/16 on neck.    Patient is accompained by: --   friend, Don   Pertinent History RA, DM2, HTN, PAD, DDD,  s/p multiple L LE revascularizations discharged from hospital a 05/05/20.  At that time he was deemed to not be a candidate for further revasc procedures.  Dc'd home with a known bypass occlusion with a plan for AKA if worsening pain. Returned to ED 05/08/20  for increased pain and seeking amputation. s/p L AKA 05/11/20    Patient Stated Goals Pt wants to be able to walk better.    Currently in Pain? Yes    Pain Score 4     Pain Location Hip    Pain Descriptors / Indicators Aching;Sore    Pain Type Chronic pain  Pain Onset More than a month ago    Pain Frequency Intermittent                               [facility] Adult PT Treatment/Exercise - 08/05/21 1454       Transfers   Transfers Sit to Stand;Stand to Sit    Sit to Stand 5: Supervision;With upper extremity assist    Stand to Sit 5: Supervision      Ambulation/Gait   Ambulation/Gait Yes    Ambulation/Gait Assistance 5: Supervision;4: Min guard    Ambulation/Gait Assistance Details During gait PT assisted to facilitate left pelvic anterior rotation and gave verbal cues. Pt reported right hand numb at end needing to rest.    Ambulation Distance (Feet) 115 Feet    Assistive device Rolling walker;Prosthesis    Gait Pattern Step-through pattern;Decreased stance time -  left;Decreased weight shift to left    Ambulation Surface Level;Indoor    Gait Comments Staggered stance with LLE in front working on bringing left ASIS over prosthesis to rotate pelvis forward x5 then back then repeated x 3 with 10 sec holds with verbal and tactile cues for upright posture.      Exercises   Exercises Other Exercises    Other Exercises  Bridge over bolster x 10 with verbal cues to engage core, then performed with raising left pelvis first then right pelvis then lowering together x 10.      Prosthetics   Prosthetic Care Comments  Pt reports he has been doing well with sweating as long as uses antiperspirant.    Current prosthetic wear tolerance (days/week)  daily    Current prosthetic wear tolerance (#hours/day)  6 hours/day    Residual limb condition  Pt reports skin intact.                     PT Education - 08/05/21 1947     Education Details Added bridging and staggered stance weight shifting to work on bringing pelvic forward more    Person(s) Educated Patient    Methods Explanation;Demonstration;Handout    Comprehension Verbalized understanding              PT Short Term Goals - 07/23/21 1105       PT SHORT TERM GOAL #1   Title Pt will tolerate wearing prosthesis at least 6 hours/day.    Baseline 07/22/21 currently 3-4 hours/day    Time 4    Period Weeks    Status New    Target Date 08/20/21      PT SHORT TERM GOAL #2   Title Pt will be independent with prosthetic management for improved function.    Baseline 06/24/21 currently needs assistance with sock ply adjustment    Time 4    Period Weeks    Status On-going    Target Date 08/20/21      PT SHORT TERM GOAL #3   Title Pt will be able to ambulate 200' with RW with prosthetis locked out on level surfaces supervision for improved household mobility.    Baseline 07/22/21 115' with RW with prosthesis locked out CGA.    Time 4    Period Weeks    Status Revised    Target Date 08/20/21       PT SHORT TERM GOAL #4   Title Pt will increase Berg to >28/56 for improved balance.    Baseline 07/22/21 25/56  Time 4    Period Weeks    Status New    Target Date 08/20/21               PT Long Term Goals - 07/23/21 1113       PT LONG TERM GOAL #1   Title Pt will be independent with progressive HEP for strengthening and balance to continue gains on own.(LTGs due 09/16/21)    Time 8    Period Weeks    Status Revised    Target Date 09/16/21      PT LONG TERM GOAL #2   Title Pt will increase gait speed to >0.71m/s for improved household mobility    Baseline 04/27/21 0.63m/s. 07/22/21 0.11m/s    Time 8    Period Weeks    Status Revised    Target Date 09/16/21      PT LONG TERM GOAL #3   Title Pt will be able to ambulate 115' mod I with RW with prosthesis locked versus unlocked if safe for improved household mobility.    Baseline 04/27/21 75' min assit with RW. 07/22/21 115' with RW CGA locked out    Time 8    Period Weeks    Status New    Target Date 09/16/21      PT LONG TERM GOAL #4   Title Pt will ambulate up/down 4 steps with rails, up/down curb and ramp, mod I for improved community access with prosthesis locked out.    Baseline unable    Time 8    Period Weeks    Status On-going    Target Date 09/16/21                   Plan - 08/05/21 1949     Clinical Impression Statement PT focused on improving left pelvic anterior rotation to translate over prosthesis weight shift. Pt able to demonstrate some improvement with practice.    Personal Factors and Comorbidities Comorbidity 3+;Time since onset of injury/illness/exacerbation    Comorbidities RA, DM2, HTN, PAD, DDD,  s/p multiple L LE revascularizations discharged from hospital a 05/05/20.  At that time he was deemed to not be a candidate for further revasc procedures.  Dc'd home with a known bypass occlusion with a plan for AKA if worsening pain. Returned to ED 05/08/20  for increased pain and seeking  amputation. s/p L AKA 05/11/20. Pt reports 3 shoulder surgeries on each shoulder and low back surgery    Examination-Activity Limitations Bathing;Locomotion Level;Transfers;Stairs;Stand;Squat;Lift    Examination-Participation Restrictions Church;Meal Prep    Stability/Clinical Decision Making Evolving/Moderate complexity    Rehab Potential Good    PT Frequency 2x / week    PT Duration 8 weeks    PT Treatment/Interventions ADLs/Self Care Home Management;DME Instruction;Gait training;Stair training;Functional mobility training;Therapeutic activities;Therapeutic exercise;Balance training;Neuromuscular re-education;Manual techniques;Passive range of motion;Prosthetic Training;Patient/family education;Vestibular    PT Next Visit Plan Continue standing balance and working on upright posture.  GAIT WITH PROSTHESIS LOCKED OUT RIGHT NOW as unable to lock on own, work on weight shifting over prosthesis. Continue to work on bringing pelvis forward over prosthesis.  Still unable to lock prosthesis if knee unlocked at this time.  Prosthetic education.    Consulted and Agree with Plan of Care Patient;Other (Comment)   friend, Timmothy Sours            Patient will benefit from skilled therapeutic intervention in order to improve the following deficits and impairments:  Abnormal gait, Decreased mobility, Decreased strength, Impaired  flexibility, Decreased range of motion, Decreased knowledge of use of DME, Prosthetic Dependency, Decreased balance, Decreased activity tolerance  Visit Diagnosis: Other abnormalities of gait and mobility  Muscle weakness (generalized)  Unsteadiness on feet     Problem List Patient Active Problem List   Diagnosis Date Noted   Arm pain 07/25/2021   Paronychia of great toe 01/28/2021   Onychomycosis 01/28/2021   Critical limb ischemia with history of revascularization of same extremity ([place]) 04/28/2020   History of COVID-19 05/10/2019   PAD (peripheral artery disease) ([place])     History of critical lower limb ischemia 02/19/2019   S/P lumbar fusion, Lower back pain 01/03/2018   Coronary artery disease involving native coronary artery of native heart with unstable angina pectoris [facility])    Preventative health care 03/13/2017   Transaminitis 12/16/2015   Bilateral shoulder pain 08/20/2012   Tobacco use disorder 08/20/2012   HTN (hypertension) 04/20/2011   HLD (hyperlipidemia) 10/08/2009   Rheumatoid arthritis ([place]) 08/11/2009    Electa Sniff, PT, DPT, NCS 08/05/2021, 7:51 PM  [facility] [address] [place], [place], [address] Phone: [phone]   Fax:  [phone]  Name: AJAY STRUBEL MRN: [id number] Date of Birth: 05/21/1961

## 2021-08-09 ENCOUNTER — Ambulatory Visit: Payer: Medicare Other

## 2021-08-11 ENCOUNTER — Ambulatory Visit (HOSPITAL_COMMUNITY)
Admission: RE | Admit: 2021-08-11 | Discharge: 2021-08-11 | Disposition: A | Discharge status: Home / Self Care | Payer: Medicare Other | Source: Ambulatory Visit | Attending: Internal Medicine | Admitting: Internal Medicine

## 2021-08-11 ENCOUNTER — Other Ambulatory Visit: Payer: Self-pay

## 2021-08-11 DIAGNOSIS — M5412 Radiculopathy, cervical region: Secondary | ICD-10-CM

## 2021-08-12 ENCOUNTER — Encounter: Payer: Self-pay | Admitting: Physical Therapy

## 2021-08-12 ENCOUNTER — Ambulatory Visit: Payer: Medicare Other | Admitting: Physical Therapy

## 2021-08-12 DIAGNOSIS — R2689 Other abnormalities of gait and mobility: Secondary | ICD-10-CM | POA: Diagnosis not present

## 2021-08-12 DIAGNOSIS — M6281 Muscle weakness (generalized): Secondary | ICD-10-CM

## 2021-08-12 DIAGNOSIS — R293 Abnormal posture: Secondary | ICD-10-CM

## 2021-08-12 DIAGNOSIS — R2681 Unsteadiness on feet: Secondary | ICD-10-CM | POA: Diagnosis not present

## 2021-08-12 NOTE — Therapy (Signed)
[facility] [address] [place], [place], [address] Phone: [phone]   Fax:  [phone]  Physical Therapy Treatment  Patient Details  Name: [name] [name] MRN: [id number] Date of Birth: 09-Dec-1960 Referring Provider (PT): left AKA   Encounter Date: 08/12/2021   PT End of Session - 08/12/21 1439     Visit Number 18    Number of Visits 30    Date for PT Re-Evaluation 09/16/21    Authorization Type [employer]HC medicare with medicaid secondary so 10th visit progress note    PT Start Time 1432   started early due to therapist open   PT Stop Time 1512    PT Time Calculation (min) 40 min    Equipment Utilized During Treatment Gait belt    Activity Tolerance Patient tolerated treatment well;Patient limited by pain    Behavior During Therapy [facility] for tasks assessed/performed             Past Medical History:  Diagnosis Date   Anemia    Anemia 08/06/2012   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   Hypertension    states is borderline on medication; has been on med. x 5-6 yr.   Hypokalemia 05/30/2018   Impingement syndrome of shoulder region 10/2014   left   Insomnia 06/01/2015   Ischemic ulcer of toe of left foot (HCC)    Left shoulder pain 12/16/2015   Low back pain without sciatica 10/29/2009   Lupus ([place])    Peripheral vascular disease (HCC)    Pneumonia    RA (rheumatoid arthritis) ([place])    Rotator cuff tear 11/04/2014    Past Surgical History:  Procedure Laterality Date   ABDOMINAL AORTAGRAM  02/20/2019   ABDOMINAL AORTOGRAM W/LOWER EXTREMITY N/A 02/20/2019   Procedure: ABDOMINAL AORTOGRAM W/LOWER EXTREMITY;  Surgeon: Marty Heck, MD;  Location: [facility];  Service: Cardiovascular;  Laterality: N/A;    ACHILLES TENDON SURGERY Bilateral    AMPUTATION Left 05/11/2020   Procedure: LEFT ABOVE KNEE AMPUTATION;  Surgeon: Angelia Mould, MD;  Location: [place];  Service: Vascular;  Laterality: Left;   ENDARTERECTOMY POPLITEAL Left 05/01/2020   Procedure: ENDARTERECTOMY POPLITEAL;  Surgeon: Waynetta Sandy, MD;  Location: [place];  Service: Vascular;  Laterality: Left;   FASCIOTOMY Left 05/01/2020   Procedure: FASCIOTOMY;  Surgeon: Waynetta Sandy, MD;  Location: [place];  Service: Vascular;  Laterality: Left;   FEMORAL-POPLITEAL BYPASS GRAFT Left 02/26/2019   Procedure: BYPASS GRAFT FEMORAL-POPLITEAL ARTERY LEFT LEG USING 72mm PROPATEN GRAFT;  Surgeon: Serafina Mitchell, MD;  Location: [facility];  Service: Vascular;  Laterality: Left;   INTRAOPERATIVE ARTERIOGRAM Left 01/22/2020   Procedure: INTRA OPERATIVE ARTERIOGRAM with administration of thrombolyics in Left femoral to popliteal bypass.;  Surgeon: Marty Heck, MD;  Location: [place];  Service: Vascular;  Laterality: Left;   LEFT HEART CATH AND CORONARY ANGIOGRAPHY N/A 03/15/2017   Procedure: LEFT HEART CATH AND CORONARY ANGIOGRAPHY;  Surgeon: Nelva Bush, MD;  Location: [place];  Service: Cardiovascular;  Laterality: N/A;   LOWER EXTREMITY INTERVENTION Left 04/29/2020   Procedure: LOWER EXTREMITY INTERVENTION- LYSIS;  Surgeon: Marty Heck, MD;  Location: [place];  Service: Cardiovascular;  Laterality: Left;   OSTEOTOMY AND ULNAR SHORTENING Right 07/22/2002   PATCH ANGIOPLASTY Left 05/01/2020   Procedure: PATCH ANGIOPLASTY USING HEMASHIELD PLATINUM FINESSE PATCH;  Surgeon: Waynetta Sandy, MD;  Location: [facility];  Service: Vascular;  Laterality: Left;   PERIPHERAL VASCULAR ATHERECTOMY  01/23/2020   Procedure: PERIPHERAL VASCULAR ATHERECTOMY;  Surgeon: Marty Heck, MD;  Location: [facility];  Service: Cardiovascular;;   PERIPHERAL VASCULAR BALLOON ANGIOPLASTY  01/23/2020    Procedure: PERIPHERAL VASCULAR BALLOON ANGIOPLASTY;  Surgeon: Marty Heck, MD;  Location: [place] [facility];  Service: Cardiovascular;;   PERIPHERAL VASCULAR INTERVENTION Left 04/30/2020   Procedure: PERIPHERAL VASCULAR INTERVENTION;  Surgeon: Cherre Robins, MD;  Location: [facility];  Service: Cardiovascular;  Laterality: Left;   PERIPHERAL VASCULAR THROMBECTOMY N/A 01/23/2020   Procedure: lysis recheck;  Surgeon: Marty Heck, MD;  Location: [place] [facility];  Service: Cardiovascular;  Laterality: N/A;  + [place]    PERIPHERAL VASCULAR THROMBECTOMY N/A 04/30/2020   Procedure: Lysis Recheck;  Surgeon: Cherre Robins, MD;  Location: [facility] [facility];  Service: Cardiovascular;  Laterality: N/A;   SHOULDER ARTHROSCOPY WITH BICEPSTENOTOMY Right 03/11/2015   Procedure: SHOULDER ARTHROSCOPY WITH BICEPSTENOTOMY;  Surgeon: Ninetta Lights, MD;  Location: [facility];  Service: Orthopedics;  Laterality: Right;   SHOULDER ARTHROSCOPY WITH DISTAL CLAVICLE RESECTION Left 11/19/2014   Procedure: SHOULDER ARTHROSCOPY WITH DISTAL CLAVICLE RESECTION;  Surgeon: Kathryne Hitch, MD;  Location: [facility];  Service: Orthopedics;  Laterality: Left;   SHOULDER ARTHROSCOPY WITH ROTATOR CUFF REPAIR AND SUBACROMIAL DECOMPRESSION Left 11/19/2014   Procedure: LEFT SHOULDER ARTHROSCOPY, DEBRIDEMENT DISTAL CLAVICLE EXCISION, ACROMIOPLASTY WITH ROTATOR CUFF REPAIR ;  Surgeon: Kathryne Hitch, MD;  Location: [facility];  Service: Orthopedics;  Laterality: Left;   THROMBECTOMY OF BYPASS GRAFT FEMORAL- POPLITEAL ARTERY Left 05/01/2020   Procedure: THROMBECTOMY OF LOWER EXTREMITY;  Surgeon: Waynetta Sandy, MD;  Location: [place] [facility];  Service: Vascular;  Laterality: Left;   TRANSFORAMINAL LUMBAR INTERBODY FUSION (TLIF) WITH PEDICLE SCREW FIXATION 1 LEVEL N/A 01/03/2018   Procedure: TRANSFORAMINAL LUMBAR INTERBODY FUSION (TLIF) LUMBAR FIVE-SACRAL ONE;   Surgeon: Melina Schools, MD;  Location: [facility];  Service: Orthopedics;  Laterality: N/A;   ULTRASOUND GUIDANCE FOR VASCULAR ACCESS Right 01/22/2020   Procedure: ULTRASOUND GUIDANCE FOR VASCULAR ACCESS Right Common Femoral Artery.;  Surgeon: Marty Heck, MD;  Location: [facility];  Service: Vascular;  Laterality: Right;   VIDEO ASSISTED THORACOSCOPY (VATS)/DECORTICATION Right 11/21/2011   drainage of empyema    There were no vitals filed for this visit.   Subjective Assessment - 08/12/21 1435     Subjective No new complaitns No falls. Continues with RA pain. Went for MRI yesterday, unable to complete it due to anxiety/claustophobia from being in the small one (the bigger one was not available for use). Plans to talk to MD about getting some meds to assist with staying in the little one, then he can reschedule it.    Patient is accompained by: Family member   sister in car   Pertinent History RA, DM2, HTN, PAD, DDD,  s/p multiple L LE revascularizations discharged from hospital a 05/05/20.  At that time he was deemed to not be a candidate for further revasc procedures.  Dc'd home with a known bypass occlusion with a plan for AKA if worsening pain. Returned to ED 05/08/20  for increased pain and seeking amputation. s/p L AKA 05/11/20  Patient Stated Goals Pt wants to be able to walk better.    Currently in Pain? Yes    Pain Score 4     Pain Location Generalized   left hip, right shoulder   Pain Orientation Right;Left    Pain Descriptors / Indicators Aching;Sore    Pain Type Chronic pain    Pain Onset More than a month ago    Pain Frequency Intermittent    Aggravating Factors  cold wet weather, increased activity    Pain Relieving Factors ice, [place] medicated creame                   [facility] Adult PT Treatment/Exercise - 08/12/21 1440       Transfers   Transfers Sit to Stand;Stand to Sit    Sit to Stand 5: Supervision;With upper extremity assist;From chair/3-in-1    Stand  to Sit 5: Supervision;With upper extremity assist;To chair/3-in-1      Ambulation/Gait   Ambulation/Gait Yes    Ambulation/Gait Assistance 5: Supervision;4: Min guard    Ambulation/Gait Assistance Details note increased IR of prosthesis on 2cd lap around track. cues/facilitaiton to keep pelvis forward and for increased anterior translation for weight shifting over prosthesis in stance. Removed prosthesis to check liner position. Liner in correct postion, pt realized he forgot to don socks and does not have them with him due to rushing to get here.    Ambulation Distance (Feet) 230 Feet    Assistive device Rolling walker;Prosthesis    Gait Pattern Step-through pattern;Decreased stance time - left;Decreased weight shift to left    Ambulation Surface Level;Outdoor      Neuro Re-ed    Neuro Re-ed Details  standing at walker with prosthetic knee unlocked (PTA guarding at knee): working on tall posture with equal weight bearing on bil sides then alternating UE raises, progressing to bil UE raises off walker handles. min guard assist. then with light fingertip support on walker handles for EO head movements left<>right, then up<>down, with min guard assist. reminder cues on posture and weight shifting needed throughout. no prosthetic knee buckling noted with all activities performed.      Knee/Hip Exercises: Aerobic   Other Aerobic SciFit x  min level 3.0 with seat positioned so not to lock out prosthesis with BLE and BUE for strengthening and aerobic tolerance. PT assisted to keep prosthesis in neutral. Pt denied any increase in right hip or knee during.      Prosthetics   Current prosthetic wear tolerance (days/week)  daily    Current prosthetic wear tolerance (#hours/day)  5-6 hours total per day    Residual limb condition  Pt reports skin intact.    Donning Prosthesis Supervision    Doffing Prosthesis Supervision                       PT Short Term Goals - 07/23/21 1105        PT SHORT TERM GOAL #1   Title Pt will tolerate wearing prosthesis at least 6 hours/day.    Baseline 07/22/21 currently 3-4 hours/day    Time 4    Period Weeks    Status New    Target Date 08/20/21      PT SHORT TERM GOAL #2   Title Pt will be independent with prosthetic management for improved function.    Baseline 06/24/21 currently needs assistance with sock ply adjustment    Time 4    Period Weeks  Status On-going    Target Date 08/20/21      PT SHORT TERM GOAL #3   Title Pt will be able to ambulate 200' with RW with prosthetis locked out on level surfaces supervision for improved household mobility.    Baseline 07/22/21 115' with RW with prosthesis locked out CGA.    Time 4    Period Weeks    Status Revised    Target Date 08/20/21      PT SHORT TERM GOAL #4   Title Pt will increase Berg to >28/56 for improved balance.    Baseline 07/22/21 25/56    Time 4    Period Weeks    Status New    Target Date 08/20/21               PT Long Term Goals - 07/23/21 1113       PT LONG TERM GOAL #1   Title Pt will be independent with progressive HEP for strengthening and balance to continue gains on own.(LTGs due 09/16/21)    Time 8    Period Weeks    Status Revised    Target Date 09/16/21      PT LONG TERM GOAL #2   Title Pt will increase gait speed to >0.35m/s for improved household mobility    Baseline 04/27/21 0.95m/s. 07/22/21 0.83m/s    Time 8    Period Weeks    Status Revised    Target Date 09/16/21      PT LONG TERM GOAL #3   Title Pt will be able to ambulate 115' mod I with RW with prosthesis locked versus unlocked if safe for improved household mobility.    Baseline 04/27/21 75' min assit with RW. 07/22/21 115' with RW CGA locked out    Time 8    Period Weeks    Status New    Target Date 09/16/21      PT LONG TERM GOAL #4   Title Pt will ambulate up/down 4 steps with rails, up/down curb and ramp, mod I for improved community access with prosthesis locked out.     Baseline unable    Time 8    Period Weeks    Status On-going    Target Date 09/16/21                   Plan - 08/12/21 1440     Clinical Impression Statement Today's skilled session continued to focus on prosthetic management, gait with RW, strengthening and standing balance. Limited mobility due to pt forgot socks and socket too loose, therefore focused on more static activities. No issues noted or reported in session. The pt is making steady progress toward goals and should benefit from continued PT to progress toward unmet goals.    Personal Factors and Comorbidities Comorbidity 3+;Time since onset of injury/illness/exacerbation    Comorbidities RA, DM2, HTN, PAD, DDD,  s/p multiple L LE revascularizations discharged from hospital a 05/05/20.  At that time he was deemed to not be a candidate for further revasc procedures.  Dc'd home with a known bypass occlusion with a plan for AKA if worsening pain. Returned to ED 05/08/20  for increased pain and seeking amputation. s/p L AKA 05/11/20. Pt reports 3 shoulder surgeries on each shoulder and low back surgery    Examination-Activity Limitations Bathing;Locomotion Level;Transfers;Stairs;Stand;Squat;Lift    Examination-Participation Restrictions Church;Meal Prep    Stability/Clinical Decision Making Evolving/Moderate complexity    Rehab Potential Good    PT Frequency  2x / week    PT Duration 8 weeks    PT Treatment/Interventions ADLs/Self Care Home Management;DME Instruction;Gait training;Stair training;Functional mobility training;Therapeutic activities;Therapeutic exercise;Balance training;Neuromuscular re-education;Manual techniques;Passive range of motion;Prosthetic Training;Patient/family education;Vestibular    PT Next Visit Plan Continue standing balance and working on upright posture.  GAIT WITH PROSTHESIS LOCKED OUT RIGHT NOW as unable to lock on own, work on weight shifting over prosthesis. Continue to work on bringing pelvis  forward over prosthesis.  Still unable to lock prosthesis if knee unlocked at this time.  Prosthetic education.    Consulted and Agree with Plan of Care Patient;Other (Comment)   friend, Timmothy Sours            Patient will benefit from skilled therapeutic intervention in order to improve the following deficits and impairments:  Abnormal gait, Decreased mobility, Decreased strength, Impaired flexibility, Decreased range of motion, Decreased knowledge of use of DME, Prosthetic Dependency, Decreased balance, Decreased activity tolerance  Visit Diagnosis: Other abnormalities of gait and mobility  Muscle weakness (generalized)  Unsteadiness on feet  Abnormal posture     Problem List Patient Active Problem List   Diagnosis Date Noted   Arm pain 07/25/2021   Paronychia of great toe 01/28/2021   Onychomycosis 01/28/2021   Critical limb ischemia with history of revascularization of same extremity ([place]) 04/28/2020   History of COVID-19 05/10/2019   PAD (peripheral artery disease) ([place])    History of critical lower limb ischemia 02/19/2019   S/P lumbar fusion, Lower back pain 01/03/2018   Coronary artery disease involving native coronary artery of native heart with unstable angina pectoris [facility])    Preventative health care 03/13/2017   Transaminitis 12/16/2015   Bilateral shoulder pain 08/20/2012   Tobacco use disorder 08/20/2012   HTN (hypertension) 04/20/2011   HLD (hyperlipidemia) 10/08/2009   Rheumatoid arthritis ([place]) 08/11/2009   Willow Ora, PTA, [facility] [address] [place], [place] [address] [phone] 08/12/21, 3:21 PM   Name: [name] [name] MRN: [id number] Date of Birth: 1961/02/08

## 2021-08-16 ENCOUNTER — Other Ambulatory Visit: Payer: Self-pay

## 2021-08-16 ENCOUNTER — Ambulatory Visit: Payer: Medicare Other

## 2021-08-16 DIAGNOSIS — R2681 Unsteadiness on feet: Secondary | ICD-10-CM | POA: Diagnosis not present

## 2021-08-16 DIAGNOSIS — R293 Abnormal posture: Secondary | ICD-10-CM | POA: Diagnosis not present

## 2021-08-16 DIAGNOSIS — M6281 Muscle weakness (generalized): Secondary | ICD-10-CM

## 2021-08-16 DIAGNOSIS — R2689 Other abnormalities of gait and mobility: Secondary | ICD-10-CM | POA: Diagnosis not present

## 2021-08-17 NOTE — Therapy (Signed)
[facility] [address] [place], [place], [address] Phone: [phone]   Fax:  [phone]  Physical Therapy Treatment  Patient Details  Name: [name] [name] MRN: [id number] Date of Birth: 1961-03-10 Referring Provider (PT): left AKA   Encounter Date: 08/16/2021   PT End of Session - 08/16/21 1448     Visit Number 19    Number of Visits 30    Date for PT Re-Evaluation 09/16/21    Authorization Type [employer]HC medicare with medicaid secondary so 10th visit progress note    PT Start Time 1446    PT Stop Time 1529    PT Time Calculation (min) 43 min    Equipment Utilized During Treatment Gait belt    Activity Tolerance Patient tolerated treatment well;Patient limited by pain    Behavior During Therapy [facility] for tasks assessed/performed             Past Medical History:  Diagnosis Date   Anemia    Anemia 08/06/2012   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   Hypertension    states is borderline on medication; has been on med. x 5-6 yr.   Hypokalemia 05/30/2018   Impingement syndrome of shoulder region 10/2014   left   Insomnia 06/01/2015   Ischemic ulcer of toe of left foot (HCC)    Left shoulder pain 12/16/2015   Low back pain without sciatica 10/29/2009   Lupus ([place])    Peripheral vascular disease (HCC)    Pneumonia    RA (rheumatoid arthritis) ([place])    Rotator cuff tear 11/04/2014    Past Surgical History:  Procedure Laterality Date   ABDOMINAL AORTAGRAM  02/20/2019   ABDOMINAL AORTOGRAM W/LOWER EXTREMITY N/A 02/20/2019   Procedure: ABDOMINAL AORTOGRAM W/LOWER EXTREMITY;  Surgeon: Marty Heck, MD;  Location: [facility];  Service: Cardiovascular;  Laterality: N/A;   ACHILLES TENDON SURGERY Bilateral     AMPUTATION Left 05/11/2020   Procedure: LEFT ABOVE KNEE AMPUTATION;  Surgeon: Angelia Mould, MD;  Location: [facility];  Service: Vascular;  Laterality: Left;   ENDARTERECTOMY POPLITEAL Left 05/01/2020   Procedure: ENDARTERECTOMY POPLITEAL;  Surgeon: Waynetta Sandy, MD;  Location: [facility];  Service: Vascular;  Laterality: Left;   FASCIOTOMY Left 05/01/2020   Procedure: FASCIOTOMY;  Surgeon: Waynetta Sandy, MD;  Location: [facility];  Service: Vascular;  Laterality: Left;   FEMORAL-POPLITEAL BYPASS GRAFT Left 02/26/2019   Procedure: BYPASS GRAFT FEMORAL-POPLITEAL ARTERY LEFT LEG USING 76mm PROPATEN GRAFT;  Surgeon: Serafina Mitchell, MD;  Location: [facility];  Service: Vascular;  Laterality: Left;   INTRAOPERATIVE ARTERIOGRAM Left 01/22/2020   Procedure: INTRA OPERATIVE ARTERIOGRAM with administration of thrombolyics in Left femoral to popliteal bypass.;  Surgeon: Marty Heck, MD;  Location: [facility];  Service: Vascular;  Laterality: Left;   LEFT HEART CATH AND CORONARY ANGIOGRAPHY N/A 03/15/2017   Procedure: LEFT HEART CATH AND CORONARY ANGIOGRAPHY;  Surgeon: Nelva Bush, MD;  Location: [facility];  Service: Cardiovascular;  Laterality: N/A;   LOWER EXTREMITY INTERVENTION Left 04/29/2020   Procedure: LOWER EXTREMITY INTERVENTION- LYSIS;  Surgeon: Marty Heck, MD;  Location: [facility];  Service: Cardiovascular;  Laterality: Left;   OSTEOTOMY AND ULNAR  SHORTENING Right 07/22/2002   PATCH ANGIOPLASTY Left 05/01/2020   Procedure: PATCH ANGIOPLASTY USING HEMASHIELD PLATINUM FINESSE PATCH;  Surgeon: Waynetta Sandy, MD;  Location: [facility];  Service: Vascular;  Laterality: Left;   PERIPHERAL VASCULAR ATHERECTOMY  01/23/2020   Procedure: PERIPHERAL VASCULAR ATHERECTOMY;  Surgeon: Marty Heck, MD;  Location: [facility];  Service: Cardiovascular;;   PERIPHERAL VASCULAR BALLOON ANGIOPLASTY  01/23/2020   Procedure: PERIPHERAL VASCULAR BALLOON  ANGIOPLASTY;  Surgeon: Marty Heck, MD;  Location: [facility];  Service: Cardiovascular;;   PERIPHERAL VASCULAR INTERVENTION Left 04/30/2020   Procedure: PERIPHERAL VASCULAR INTERVENTION;  Surgeon: Cherre Robins, MD;  Location: [facility];  Service: Cardiovascular;  Laterality: Left;   PERIPHERAL VASCULAR THROMBECTOMY N/A 01/23/2020   Procedure: lysis recheck;  Surgeon: Marty Heck, MD;  Location: [facility];  Service: Cardiovascular;  Laterality: N/A;  + [place]    PERIPHERAL VASCULAR THROMBECTOMY N/A 04/30/2020   Procedure: Lysis Recheck;  Surgeon: Cherre Robins, MD;  Location: [facility];  Service: Cardiovascular;  Laterality: N/A;   SHOULDER ARTHROSCOPY WITH BICEPSTENOTOMY Right 03/11/2015   Procedure: SHOULDER ARTHROSCOPY WITH BICEPSTENOTOMY;  Surgeon: Ninetta Lights, MD;  Location: [facility];  Service: Orthopedics;  Laterality: Right;   SHOULDER ARTHROSCOPY WITH DISTAL CLAVICLE RESECTION Left 11/19/2014   Procedure: SHOULDER ARTHROSCOPY WITH DISTAL CLAVICLE RESECTION;  Surgeon: Kathryne Hitch, MD;  Location: [facility];  Service: Orthopedics;  Laterality: Left;   SHOULDER ARTHROSCOPY WITH ROTATOR CUFF REPAIR AND SUBACROMIAL DECOMPRESSION Left 11/19/2014   Procedure: LEFT SHOULDER ARTHROSCOPY, DEBRIDEMENT DISTAL CLAVICLE EXCISION, ACROMIOPLASTY WITH ROTATOR CUFF REPAIR ;  Surgeon: Kathryne Hitch, MD;  Location: [facility];  Service: Orthopedics;  Laterality: Left;   THROMBECTOMY OF BYPASS GRAFT FEMORAL- POPLITEAL ARTERY Left 05/01/2020   Procedure: THROMBECTOMY OF LOWER EXTREMITY;  Surgeon: Waynetta Sandy, MD;  Location: [facility];  Service: Vascular;  Laterality: Left;   TRANSFORAMINAL LUMBAR INTERBODY FUSION (TLIF) WITH PEDICLE SCREW FIXATION 1 LEVEL N/A 01/03/2018   Procedure: TRANSFORAMINAL LUMBAR INTERBODY FUSION (TLIF) LUMBAR FIVE-SACRAL ONE;  Surgeon: Melina Schools, MD;  Location: [facility];   Service: Orthopedics;  Laterality: N/A;   ULTRASOUND GUIDANCE FOR VASCULAR ACCESS Right 01/22/2020   Procedure: ULTRASOUND GUIDANCE FOR VASCULAR ACCESS Right Common Femoral Artery.;  Surgeon: Marty Heck, MD;  Location: [facility];  Service: Vascular;  Laterality: Right;   VIDEO ASSISTED THORACOSCOPY (VATS)/DECORTICATION Right 11/21/2011   drainage of empyema    There were no vitals filed for this visit.   Subjective Assessment - 08/16/21 1448     Subjective Pt reports that he is doing ok. Pt's dog bit him the other day and he has 2 puncture marks on right medial thigh scabbing over with bruising around it. Pt reports he cleaned it well and is keeping an eye on it.    Patient is accompained by: Family member   sister in car   Pertinent History RA, DM2, HTN, PAD, DDD,  s/p multiple L LE revascularizations discharged from hospital a 05/05/20.  At that time he was deemed to not be a candidate for further revasc procedures.  Dc'd home with a known bypass occlusion with a plan for AKA if worsening pain. Returned to ED 05/08/20  for increased pain and seeking amputation. s/p L AKA 05/11/20    Patient Stated Goals Pt wants to be able to walk better.    Currently in Pain? Yes  Pain Score 4     Pain Location Hip   and shoulders   Pain Orientation Right;Left    Pain Descriptors / Indicators Aching;Sore    Pain Type Chronic pain    Pain Onset More than a month ago    Pain Frequency Intermittent    Aggravating Factors  cold weather and increased activity                               [facility] Adult PT Treatment/Exercise - 08/16/21 1454       Transfers   Transfers Sit to Stand;Stand to Sit    Sit to Stand 5: Supervision    Stand to Sit 5: Supervision      Ambulation/Gait   Ambulation/Gait Yes    Ambulation/Gait Assistance 5: Supervision;4: Min guard    Ambulation/Gait Assistance Details Verbal cues to decrease left step and increase right step for improved weight shift  over prosthesis. Tactile cues at pelvis to shift over left. Pt reporting pain in right thigh where dog bit him. Took breaks between bouts. Also noted prosthesis to be rotating internally during first 2 bouts even after repositioned. Pt did have socks on. PT removed liner after 2nd bout as had slid down as skin moist. Dried off leg and liner and redonned. Improved prosthetic position after this. Prosthesis locked during gait.    Ambulation Distance (Feet) 115 Feet   x 3   Assistive device Rolling walker    Gait Pattern Step-through pattern;Decreased step length - right;Decreased stance time - left    Ambulation Surface Level;Indoor      Prosthetics   Prosthetic Care Comments  Pt reports that he has the cut off sock and a 5 ply on today. PT educated on importance of removing liner to dry off skin periodically especially if finds prosthesis is moving around more as found it has slid down in session today and skin was moist. Pt continues to use antiperspirant. Educated on sweat block wipes if continues to have more issues with sweating.    Current prosthetic wear tolerance (days/week)  daily    Current prosthetic wear tolerance (#hours/day)  5-6 hours total per day    Residual limb condition  Skin intact on residual limb    Donning Prosthesis Minimal assist    Doffing Prosthesis Supervision                     PT Education - 08/17/21 0811     Education Details Monitoring dog bite site for any signs of infection-redness, fever, drainage. Recommended he call MD to see if they want to see him about this to be safe.    Person(s) Educated Patient    Methods Explanation    Comprehension Verbalized understanding              PT Short Term Goals - 07/23/21 1105       PT SHORT TERM GOAL #1   Title Pt will tolerate wearing prosthesis at least 6 hours/day.    Baseline 07/22/21 currently 3-4 hours/day    Time 4    Period Weeks    Status New    Target Date 08/20/21      PT SHORT TERM  GOAL #2   Title Pt will be independent with prosthetic management for improved function.    Baseline 06/24/21 currently needs assistance with sock ply adjustment    Time 4    Period  Weeks    Status On-going    Target Date 08/20/21      PT SHORT TERM GOAL #3   Title Pt will be able to ambulate 200' with RW with prosthetis locked out on level surfaces supervision for improved household mobility.    Baseline 07/22/21 115' with RW with prosthesis locked out CGA.    Time 4    Period Weeks    Status Revised    Target Date 08/20/21      PT SHORT TERM GOAL #4   Title Pt will increase Berg to >28/56 for improved balance.    Baseline 07/22/21 25/56    Time 4    Period Weeks    Status New    Target Date 08/20/21               PT Long Term Goals - 07/23/21 1113       PT LONG TERM GOAL #1   Title Pt will be independent with progressive HEP for strengthening and balance to continue gains on own.(LTGs due 09/16/21)    Time 8    Period Weeks    Status Revised    Target Date 09/16/21      PT LONG TERM GOAL #2   Title Pt will increase gait speed to >0.36m/s for improved household mobility    Baseline 04/27/21 0.47m/s. 07/22/21 0.48m/s    Time 8    Period Weeks    Status Revised    Target Date 09/16/21      PT LONG TERM GOAL #3   Title Pt will be able to ambulate 115' mod I with RW with prosthesis locked versus unlocked if safe for improved household mobility.    Baseline 04/27/21 75' min assit with RW. 07/22/21 115' with RW CGA locked out    Time 8    Period Weeks    Status New    Target Date 09/16/21      PT LONG TERM GOAL #4   Title Pt will ambulate up/down 4 steps with rails, up/down curb and ramp, mod I for improved community access with prosthesis locked out.    Baseline unable    Time 8    Period Weeks    Status On-going    Target Date 09/16/21                   Plan - 08/17/21 2706     Clinical Impression Statement Pt had socks donned today but no extra with  him. Still had issues with prosthesis internally rotating with gait. Removed and found skin moist and liner had slid down. Pt reports he is still using antiperspirant.    Personal Factors and Comorbidities Comorbidity 3+;Time since onset of injury/illness/exacerbation    Comorbidities RA, DM2, HTN, PAD, DDD,  s/p multiple L LE revascularizations discharged from hospital a 05/05/20.  At that time he was deemed to not be a candidate for further revasc procedures.  Dc'd home with a known bypass occlusion with a plan for AKA if worsening pain. Returned to ED 05/08/20  for increased pain and seeking amputation. s/p L AKA 05/11/20. Pt reports 3 shoulder surgeries on each shoulder and low back surgery    Examination-Activity Limitations Bathing;Locomotion Level;Transfers;Stairs;Stand;Squat;Lift    Examination-Participation Restrictions Church;Meal Prep    Stability/Clinical Decision Making Evolving/Moderate complexity    Rehab Potential Good    PT Frequency 2x / week    PT Duration 8 weeks    PT Treatment/Interventions ADLs/Self Care Home Management;DME  Instruction;Gait training;Stair training;Functional mobility training;Therapeutic activities;Therapeutic exercise;Balance training;Neuromuscular re-education;Manual techniques;Passive range of motion;Prosthetic Training;Patient/family education;Vestibular    PT Next Visit Plan Check STGs and progress note. How is right leg doing from dog bite? Continue standing balance and working on upright posture.  GAIT WITH PROSTHESIS LOCKED OUT RIGHT NOW as unable to lock on own, work on weight shifting over prosthesis. Continue to work on bringing pelvis forward over prosthesis.  Still unable to lock prosthesis if knee unlocked at this time.  Prosthetic education.    Consulted and Agree with Plan of Care Patient;Other (Comment)   friend, Timmothy Sours            Patient will benefit from skilled therapeutic intervention in order to improve the following deficits and  impairments:  Abnormal gait, Decreased mobility, Decreased strength, Impaired flexibility, Decreased range of motion, Decreased knowledge of use of DME, Prosthetic Dependency, Decreased balance, Decreased activity tolerance  Visit Diagnosis: Other abnormalities of gait and mobility  Muscle weakness (generalized)  Unsteadiness on feet     Problem List Patient Active Problem List   Diagnosis Date Noted   Arm pain 07/25/2021   Paronychia of great toe 01/28/2021   Onychomycosis 01/28/2021   Critical limb ischemia with history of revascularization of same extremity ([place]) 04/28/2020   History of COVID-19 05/10/2019   PAD (peripheral artery disease) ([place])    History of critical lower limb ischemia 02/19/2019   S/P lumbar fusion, Lower back pain 01/03/2018   Coronary artery disease involving native coronary artery of native heart with unstable angina pectoris [facility])    Preventative health care 03/13/2017   Transaminitis 12/16/2015   Bilateral shoulder pain 08/20/2012   Tobacco use disorder 08/20/2012   HTN (hypertension) 04/20/2011   HLD (hyperlipidemia) 10/08/2009   Rheumatoid arthritis ([place]) 08/11/2009    Electa Sniff, PT, DPT, NCS 08/17/2021, 8:14 AM  [facility] [address] [place], [place], [address] Phone: [phone]   Fax:  [phone]  Name: [name] [name] MRN: [id number] Date of Birth: Dec 05, 1960

## 2021-08-19 ENCOUNTER — Other Ambulatory Visit: Payer: Self-pay

## 2021-08-19 ENCOUNTER — Ambulatory Visit: Payer: Medicare Other

## 2021-08-19 DIAGNOSIS — R293 Abnormal posture: Secondary | ICD-10-CM

## 2021-08-19 DIAGNOSIS — M6281 Muscle weakness (generalized): Secondary | ICD-10-CM | POA: Diagnosis not present

## 2021-08-19 DIAGNOSIS — R2689 Other abnormalities of gait and mobility: Secondary | ICD-10-CM | POA: Diagnosis not present

## 2021-08-19 DIAGNOSIS — R2681 Unsteadiness on feet: Secondary | ICD-10-CM

## 2021-08-20 NOTE — Therapy (Signed)
[facility] [address] [place], [place], [address] Phone: [phone]   Fax:  [phone]  Physical Therapy Treatment/Progress note  Patient Details  Name: [name] [name] MRN: [id number] Date of Birth: 03-May-1961 Referring Provider (PT): left AKA    Progress Note  Reporting period 07/01/21 to 08/19/21  See Note below for Objective Data and Assessment of Progress/Goals  Encounter Date: 08/19/2021   PT End of Session - 08/19/21 1450     Visit Number 20    Number of Visits 30    Date for PT Re-Evaluation 09/16/21    Authorization Type UHC medicare with medicaid secondary so 10th visit progress note    PT Start Time 1447    PT Stop Time 1530    PT Time Calculation (min) 43 min    Equipment Utilized During Treatment Gait belt    Activity Tolerance Patient tolerated treatment well;Patient limited by pain    Behavior During Therapy [facility] for tasks assessed/performed             Past Medical History:  Diagnosis Date   Anemia    Anemia 08/06/2012   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   Hypertension    states is borderline on medication; has been on med. x 5-6 yr.   Hypokalemia 05/30/2018   Impingement syndrome of shoulder region 10/2014   left   Insomnia 06/01/2015   Ischemic ulcer of toe of left foot (HCC)    Left shoulder pain 12/16/2015   Low back pain without sciatica 10/29/2009   Lupus ([place])    Peripheral vascular disease (HCC)    Pneumonia    RA (rheumatoid arthritis) ([place])    Rotator cuff tear 11/04/2014    Past Surgical History:  Procedure Laterality Date   ABDOMINAL AORTAGRAM  02/20/2019   ABDOMINAL AORTOGRAM W/LOWER EXTREMITY N/A 02/20/2019   Procedure: ABDOMINAL AORTOGRAM W/LOWER EXTREMITY;  Surgeon:  Marty Heck, MD;  Location: [place] [facility];  Service: Cardiovascular;  Laterality: N/A;   ACHILLES TENDON SURGERY Bilateral    AMPUTATION Left 05/11/2020   Procedure: LEFT ABOVE KNEE AMPUTATION;  Surgeon: Angelia Mould, MD;  Location: [facility];  Service: Vascular;  Laterality: Left;   ENDARTERECTOMY POPLITEAL Left 05/01/2020   Procedure: ENDARTERECTOMY POPLITEAL;  Surgeon: Waynetta Sandy, MD;  Location: [place];  Service: Vascular;  Laterality: Left;   FASCIOTOMY Left 05/01/2020   Procedure: FASCIOTOMY;  Surgeon: Waynetta Sandy, MD;  Location: [place];  Service: Vascular;  Laterality: Left;   FEMORAL-POPLITEAL BYPASS GRAFT Left 02/26/2019   Procedure: BYPASS GRAFT FEMORAL-POPLITEAL ARTERY LEFT LEG USING 53m PROPATEN GRAFT;  Surgeon: BSerafina Mitchell MD;  Location: [place]  Service: Vascular;  Laterality: Left;   INTRAOPERATIVE ARTERIOGRAM Left 01/22/2020   Procedure: INTRA OPERATIVE ARTERIOGRAM with administration of thrombolyics in Left femoral to popliteal bypass.;  Surgeon: CMarty Heck MD;  Location[place] [facility]  Service: Vascular;  Laterality: Left;   LEFT HEART CATH AND CORONARY ANGIOGRAPHY N/A 03/15/2017   Procedure: LEFT HEART CATH AND CORONARY ANGIOGRAPHY;  Surgeon: ENelva Bush MD;  [facility];  Service: Cardiovascular;  Laterality: N/A;   LOWER EXTREMITY INTERVENTION Left 04/29/2020   Procedure: LOWER EXTREMITY INTERVENTION- LYSIS;  Surgeon: Marty Heck, MD;  Location: [place] [facility];  Service: Cardiovascular;  Laterality: Left;   OSTEOTOMY AND ULNAR SHORTENING Right 07/22/2002   PATCH ANGIOPLASTY Left 05/01/2020   Procedure: PATCH ANGIOPLASTY USING HEMASHIELD PLATINUM FINESSE PATCH;  Surgeon: Waynetta Sandy, MD;  [place];  Service: Vascular;  Laterality: Left;   PERIPHERAL VASCULAR ATHERECTOMY  01/23/2020   Procedure: PERIPHERAL VASCULAR ATHERECTOMY;  Surgeon: Marty Heck, MD;  Location: [place] [facility];  Service: Cardiovascular;;   PERIPHERAL VASCULAR BALLOON ANGIOPLASTY  01/23/2020   Procedure: PERIPHERAL VASCULAR BALLOON ANGIOPLASTY;  Surgeon: Marty Heck, MD;  Location: [place] [facility];  Service: Cardiovascular;;   PERIPHERAL VASCULAR INTERVENTION Left 04/30/2020   Procedure: PERIPHERAL VASCULAR INTERVENTION;  Surgeon: Cherre Robins, MD;  Location: [place] [facility];  Service: Cardiovascular;  Laterality: Left;   PERIPHERAL VASCULAR THROMBECTOMY N/A 01/23/2020   Procedure: lysis recheck;  Surgeon: Marty Heck, MD;  Location: [place] [facility];  Service: Cardiovascular;  Laterality: N/A;  + [place]    PERIPHERAL VASCULAR THROMBECTOMY N/A 04/30/2020   Procedure: Lysis Recheck;  Surgeon: Cherre Robins, MD;  Location: [place] [facility];  Service: Cardiovascular;  Laterality: N/A;   SHOULDER ARTHROSCOPY WITH BICEPSTENOTOMY Right 03/11/2015   Procedure: SHOULDER ARTHROSCOPY WITH BICEPSTENOTOMY;  Surgeon: Ninetta Lights, MD;  Location: [place];  Service: Orthopedics;  Laterality: Right;   SHOULDER ARTHROSCOPY WITH DISTAL CLAVICLE RESECTION Left 11/19/2014   Procedure: SHOULDER ARTHROSCOPY WITH DISTAL CLAVICLE RESECTION;  Surgeon: Kathryne Hitch, MD;  Location: [place];  Service: Orthopedics;  Laterality: Left;   SHOULDER ARTHROSCOPY WITH ROTATOR CUFF REPAIR AND SUBACROMIAL DECOMPRESSION Left 11/19/2014   Procedure: LEFT SHOULDER ARTHROSCOPY, DEBRIDEMENT DISTAL CLAVICLE EXCISION, ACROMIOPLASTY WITH ROTATOR CUFF REPAIR ;  Surgeon: Kathryne Hitch, MD;  Location: [place];  Service: Orthopedics;  Laterality: Left;   THROMBECTOMY OF BYPASS GRAFT FEMORAL- POPLITEAL ARTERY Left 05/01/2020   Procedure: THROMBECTOMY OF LOWER EXTREMITY;  Surgeon: Waynetta Sandy, MD;  Location: [place];  Service: Vascular;  Laterality: Left;   TRANSFORAMINAL LUMBAR INTERBODY FUSION (TLIF) WITH PEDICLE SCREW FIXATION 1 LEVEL N/A  01/03/2018   Procedure: TRANSFORAMINAL LUMBAR INTERBODY FUSION (TLIF) LUMBAR FIVE-SACRAL ONE;  Surgeon: Melina Schools, MD;  Location: [facility];  Service: Orthopedics;  Laterality: N/A;   ULTRASOUND GUIDANCE FOR VASCULAR ACCESS Right 01/22/2020   Procedure: ULTRASOUND GUIDANCE FOR VASCULAR ACCESS Right Common Femoral Artery.;  Surgeon: Marty Heck, MD;  Location: [place];  Service: Vascular;  Laterality: Right;   VIDEO ASSISTED THORACOSCOPY (VATS)/DECORTICATION Right 11/21/2011   drainage of empyema    There were no vitals filed for this visit.   Subjective Assessment - 08/19/21 1451     Subjective Pt reports that the dog bite on right leg seems to be improving. Had some red bloody discharge the other day out of lower hole but is not scabbed over. Has been putting peroxide on it. Explained that peroxide is not the best things for wounds as kills the good bacteria as well. If any concerns should see MD right away. Low back and hips more sore the last couple days.    Patient is accompained by: Family member   sister in car   Pertinent History RA, DM2, HTN, PAD, DDD,  s/p multiple L LE revascularizations discharged from hospital a 05/05/20.  At that time he was deemed to not be a candidate for further revasc procedures.  Dc'd home with  a known bypass occlusion with a plan for AKA if worsening pain. Returned to ED 05/08/20  for increased pain and seeking amputation. s/p L AKA 05/11/20    Patient Stated Goals Pt wants to be able to walk better.    Currently in Pain? Yes    Pain Score 5     Pain Location Back   and hips   Pain Orientation Lower    Pain Descriptors / Indicators Aching;Sore    Pain Type Chronic pain    Pain Onset More than a month ago    Pain Frequency Intermittent                               [facility] Adult PT Treatment/Exercise - 08/19/21 1453       Transfers   Transfers Sit to Stand;Stand to Sit    Sit to Stand 5: Supervision;With upper extremity assist     Stand to Sit 5: Supervision      Ambulation/Gait   Ambulation/Gait Yes    Ambulation/Gait Assistance 5: Supervision;4: Min guard    Ambulation/Gait Assistance Details Verbal cues to shift over prosthesis and stand tall. Prosthesis locked out with w/c follow.    Ambulation Distance (Feet) 230 Feet    Assistive device Rolling walker;Prosthesis    Gait Pattern Step-through pattern;Decreased stance time - left    Ambulation Surface Level;Indoor      Standardized Balance Assessment   Standardized Balance Assessment Berg Balance Test      Berg Balance Test   Sit to Stand Able to stand  independently using hands    Standing Unsupported Able to stand 30 seconds unsupported   stoodd 1 min 40 sec   Sitting with Back Unsupported but Feet Supported on Floor or Stool Able to sit safely and securely 2 minutes    Stand to Sit Sits safely with minimal use of hands    Transfers Able to transfer safely, definite need of hands    Standing Unsupported with Eyes Closed Able to stand 10 seconds with supervision    Standing Ubsupported with Feet Together Able to place feet together independently and stand for 1 minute with supervision    From Standing, Reach Forward with Outstretched Arm Can reach forward >12 cm safely (5")    From Standing Position, Pick up Object from Floor Unable to try/needs assist to keep balance    From Standing Position, Turn to Look Behind Over each Shoulder Turn sideways only but maintains balance    Turn 360 Degrees Needs assistance while turning    Standing Unsupported, Alternately Place Feet on Step/Stool Able to complete >2 steps/needs minimal assist    Standing Unsupported, One Foot in Front Able to take small step independently and hold 30 seconds    Standing on One Leg Unable to try or needs assist to prevent fall    Total Score 30      Prosthetics   Prosthetic Care Comments  Pt wearing 10 ply with 1 cut off sock. Reports he has been removing liner half way through wear  time to dry skin and it helps.    Current prosthetic wear tolerance (days/week)  daily    Current prosthetic wear tolerance (#hours/day)  5 hours    Residual limb condition  Pt denies any concerns with skin stating it is intact.  PT Education - 08/20/21 2021     Education Details Results of goal check    Person(s) Educated Patient    Methods Explanation    Comprehension Verbalized understanding              PT Short Term Goals - 08/19/21 1524       PT SHORT TERM GOAL #1   Title Pt will tolerate wearing prosthesis at least 6 hours/day.    Baseline 07/22/21 currently 3-4 hours/day. 08/19/21 Pt currently wearing prosthesis 5-6 hours/day    Time 4    Period Weeks    Status Achieved    Target Date 08/20/21      PT SHORT TERM GOAL #2   Title Pt will be independent with prosthetic management for improved function.    Baseline 06/24/21 currently needs assistance with sock ply adjustment. 08/19/21 Pt is independent with donning/doffing prosthesis. Able to adjust socks now on own.    Time 4    Period Weeks    Status Achieved    Target Date 08/20/21      PT SHORT TERM GOAL #3   Title Pt will be able to ambulate 200' with RW with prosthetis locked out on level surfaces supervision for improved household mobility.    Baseline 07/22/21 115' with RW with prosthesis locked out CGA. 08/19/21 230' with RW supervision/CGA    Time 4    Period Weeks    Status Partially Met    Target Date 08/20/21      PT SHORT TERM GOAL #4   Title Pt will increase Berg to >28/56 for improved balance.    Baseline 07/22/21 25/56. 08/19/21 30/56    Time 4    Period Weeks    Status Achieved    Target Date 08/20/21               PT Long Term Goals - 07/23/21 1113       PT LONG TERM GOAL #1   Title Pt will be independent with progressive HEP for strengthening and balance to continue gains on own.(LTGs due 09/16/21)    Time 8    Period Weeks    Status Revised    Target  Date 09/16/21      PT LONG TERM GOAL #2   Title Pt will increase gait speed to >0.65ms for improved household mobility    Baseline 04/27/21 0.124m. 07/22/21 0.2263m   Time 8    Period Weeks    Status Revised    Target Date 09/16/21      PT LONG TERM GOAL #3   Title Pt will be able to ambulate 115' mod I with RW with prosthesis locked versus unlocked if safe for improved household mobility.    Baseline 04/27/21 75' min assit with RW. 07/22/21 115' with RW CGA locked out    Time 8    Period Weeks    Status New    Target Date 09/16/21      PT LONG TERM GOAL #4   Title Pt will ambulate up/down 4 steps with rails, up/down curb and ramp, mod I for improved community access with prosthesis locked out.    Baseline unable    Time 8    Period Weeks    Status On-going    Target Date 09/16/21                   Plan - 08/20/21 2021     Clinical Impression Statement PT assessed STGs  with pt meeding 3/4 and partially meeting the 4th. He was able to increase gait distance with more upright posture but gait distance does vary day to day depending on pain level. Pt has shown significant improvement in his posture but still needs to have prosthesis locked out to ambulate safely. He increased Berg score to 30/56 indicating improving balance but still fall risk. Pt will continue to benefit from skilled PT to continue to progress towards remaining goals.    Personal Factors and Comorbidities Comorbidity 3+;Time since onset of injury/illness/exacerbation    Comorbidities RA, DM2, HTN, PAD, DDD,  s/p multiple L LE revascularizations discharged from hospital a 05/05/20.  At that time he was deemed to not be a candidate for further revasc procedures.  Dc'd home with a known bypass occlusion with a plan for AKA if worsening pain. Returned to ED 05/08/20  for increased pain and seeking amputation. s/p L AKA 05/11/20. Pt reports 3 shoulder surgeries on each shoulder and low back surgery     Examination-Activity Limitations Bathing;Locomotion Level;Transfers;Stairs;Stand;Squat;Lift    Examination-Participation Restrictions Church;Meal Prep    Stability/Clinical Decision Making Evolving/Moderate complexity    Rehab Potential Good    PT Frequency 2x / week    PT Duration 8 weeks    PT Treatment/Interventions ADLs/Self Care Home Management;DME Instruction;Gait training;Stair training;Functional mobility training;Therapeutic activities;Therapeutic exercise;Balance training;Neuromuscular re-education;Manual techniques;Passive range of motion;Prosthetic Training;Patient/family education;Vestibular    PT Next Visit Plan How is right leg doing from dog bite? Continue standing balance and working on upright posture.  GAIT WITH PROSTHESIS LOCKED OUT RIGHT NOW as unable to lock on own, work on weight shifting over prosthesis. Continue to work on bringing pelvis forward over prosthesis.  Still unable to lock prosthesis if knee unlocked at this time.  Prosthetic education. Gait in // bars with 1 UE support.    Consulted and Agree with Plan of Care Patient;Other (Comment)   friend, Timmothy Sours            Patient will benefit from skilled therapeutic intervention in order to improve the following deficits and impairments:  Abnormal gait, Decreased mobility, Decreased strength, Impaired flexibility, Decreased range of motion, Decreased knowledge of use of DME, Prosthetic Dependency, Decreased balance, Decreased activity tolerance  Visit Diagnosis: Other abnormalities of gait and mobility  Muscle weakness (generalized)  Unsteadiness on feet  Abnormal posture     Problem List Patient Active Problem List   Diagnosis Date Noted   Arm pain 07/25/2021   Paronychia of great toe 01/28/2021   Onychomycosis 01/28/2021   Critical limb ischemia with history of revascularization of same extremity ([place]) 04/28/2020   History of COVID-19 05/10/2019   PAD (peripheral artery disease) ([place])    History of  critical lower limb ischemia 02/19/2019   S/P lumbar fusion, Lower back pain 01/03/2018   Coronary artery disease involving native coronary artery of native heart with unstable angina pectoris [facility])    Preventative health care 03/13/2017   Transaminitis 12/16/2015   Bilateral shoulder pain 08/20/2012   Tobacco use disorder 08/20/2012   HTN (hypertension) 04/20/2011   HLD (hyperlipidemia) 10/08/2009   Rheumatoid arthritis ([place]) 08/11/2009    Electa Sniff, PT, DPT, NCS 08/20/2021, 8:24 PM  [facility] [address] [place], [place], [address] Phone: [phone]   Fax:  [phone]  Name: KYMIR COLES MRN: [id number] Date of Birth: 1961/03/29

## 2021-08-23 ENCOUNTER — Ambulatory Visit: Payer: Medicare Other | Admitting: Physical Therapy

## 2021-08-26 ENCOUNTER — Ambulatory Visit: Payer: Medicare Other | Attending: Vascular Surgery

## 2021-08-26 ENCOUNTER — Other Ambulatory Visit: Payer: Self-pay

## 2021-08-26 DIAGNOSIS — R2689 Other abnormalities of gait and mobility: Secondary | ICD-10-CM | POA: Diagnosis not present

## 2021-08-26 DIAGNOSIS — R2681 Unsteadiness on feet: Secondary | ICD-10-CM | POA: Diagnosis not present

## 2021-08-26 DIAGNOSIS — M6281 Muscle weakness (generalized): Secondary | ICD-10-CM | POA: Insufficient documentation

## 2021-08-26 DIAGNOSIS — R293 Abnormal posture: Secondary | ICD-10-CM

## 2021-08-26 NOTE — Therapy (Addendum)
OUTPATIENT PHYSICAL THERAPY TREATMENT NOTE   Patient Name: [name] [name] MRN: [id number] DOB:[birth year], 61 y.o., male Today's Date: 08/26/2021  PCP: Scarlett Presto, MD REFERRING PROVIDER: Angelia Mould,*   PT End of Session - 08/26/21 1447     Visit Number 21    Number of Visits 30    Date for PT Re-Evaluation 09/16/21    Authorization Type [employer] medicare with medicaid secondary so 10th visit progress note    PT Start Time 1445    PT Stop Time 1530    PT Time Calculation (min) 45 min    Equipment Utilized During Treatment Gait belt    Activity Tolerance Patient tolerated treatment well;Patient limited by pain    Behavior During Therapy [facility] for tasks assessed/performed             Past Medical History:  Diagnosis Date   Anemia    Anemia 08/06/2012   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   Hypertension    states is borderline on medication; has been on med. x 5-6 yr.   Hypokalemia 05/30/2018   Impingement syndrome of shoulder region 10/2014   left   Insomnia 06/01/2015   Ischemic ulcer of toe of left foot (HCC)    Left shoulder pain 12/16/2015   Low back pain without sciatica 10/29/2009   Lupus ([place])    Peripheral vascular disease (HCC)    Pneumonia    RA (rheumatoid arthritis) ([place])    Rotator cuff tear 11/04/2014   Past Surgical History:  Procedure Laterality Date   ABDOMINAL AORTAGRAM  02/20/2019   ABDOMINAL AORTOGRAM W/LOWER EXTREMITY N/A 02/20/2019   Procedure: ABDOMINAL AORTOGRAM W/LOWER EXTREMITY;  Surgeon: Marty Heck, MD;  Location: [place] [facility];  Service: Cardiovascular;  Laterality: N/A;   ACHILLES TENDON SURGERY Bilateral    AMPUTATION Left 05/11/2020   Procedure: LEFT ABOVE KNEE AMPUTATION;  Surgeon: Angelia Mould, MD;  Location:  [place];  Service: Vascular;  Laterality: Left;   ENDARTERECTOMY POPLITEAL Left 05/01/2020   Procedure: ENDARTERECTOMY POPLITEAL;  Surgeon: Waynetta Sandy, MD;  Location: [place];  Service: Vascular;  Laterality: Left;   FASCIOTOMY Left 05/01/2020   Procedure: FASCIOTOMY;  Surgeon: Waynetta Sandy, MD;  Location: [place];  Service: Vascular;  Laterality: Left;   FEMORAL-POPLITEAL BYPASS GRAFT Left 02/26/2019   Procedure: BYPASS GRAFT FEMORAL-POPLITEAL ARTERY LEFT LEG USING 26m PROPATEN GRAFT;  Surgeon: BSerafina Mitchell MD;  Location[place]  Service: Vascular;  Laterality: Left;   INTRAOPERATIVE ARTERIOGRAM Left 01/22/2020   Procedure: INTRA OPERATIVE ARTERIOGRAM with administration of thrombolyics in Left femoral to popliteal bypass.;  Surgeon: CMarty Heck MD;  Location[place]  Service: Vascular;  Laterality: Left;   LEFT HEART CATH AND CORONARY ANGIOGRAPHY N/A 03/15/2017   Procedure: LEFT HEART CATH AND CORONARY ANGIOGRAPHY;  Surgeon: ENelva Bush MD;  [place];  Service: Cardiovascular;  Laterality: N/A;   LOWER EXTREMITY INTERVENTION Left 04/29/2020   Procedure: LOWER EXTREMITY INTERVENTION- LYSIS;  Surgeon: CMarty Heck MD;  [place] [facility];  Service: Cardiovascular;  Laterality: Left;   OSTEOTOMY AND ULNAR SHORTENING Right 07/22/2002   PATCH ANGIOPLASTY Left 05/01/2020   Procedure: PATCH ANGIOPLASTY USING HEMASHIELD PLATINUM FINESSE PATCH;  Surgeon: Waynetta Sandy, MD;  Location: [place];  Service: Vascular;  Laterality: Left;   PERIPHERAL VASCULAR ATHERECTOMY  01/23/2020   Procedure: PERIPHERAL VASCULAR ATHERECTOMY;  Surgeon: Marty Heck, MD;  Location: [place] [facility];  Service: Cardiovascular;;   PERIPHERAL VASCULAR BALLOON ANGIOPLASTY  01/23/2020   Procedure: PERIPHERAL VASCULAR BALLOON ANGIOPLASTY;  Surgeon: Marty Heck, MD;  Location: [place] [facility];  Service: Cardiovascular;;   PERIPHERAL  VASCULAR INTERVENTION Left 04/30/2020   Procedure: PERIPHERAL VASCULAR INTERVENTION;  Surgeon: Cherre Robins, MD;  Location: [place] [facility];  Service: Cardiovascular;  Laterality: Left;   PERIPHERAL VASCULAR THROMBECTOMY N/A 01/23/2020   Procedure: lysis recheck;  Surgeon: Marty Heck, MD;  Location: [place] [facility];  Service: Cardiovascular;  Laterality: N/A;  + [place]    PERIPHERAL VASCULAR THROMBECTOMY N/A 04/30/2020   Procedure: Lysis Recheck;  Surgeon: Cherre Robins, MD;  Location: [place] [facility];  Service: Cardiovascular;  Laterality: N/A;   SHOULDER ARTHROSCOPY WITH BICEPSTENOTOMY Right 03/11/2015   Procedure: SHOULDER ARTHROSCOPY WITH BICEPSTENOTOMY;  Surgeon: Ninetta Lights, MD;  Location: [place];  Service: Orthopedics;  Laterality: Right;   SHOULDER ARTHROSCOPY WITH DISTAL CLAVICLE RESECTION Left 11/19/2014   Procedure: SHOULDER ARTHROSCOPY WITH DISTAL CLAVICLE RESECTION;  Surgeon: Kathryne Hitch, MD;  Location: [place];  Service: Orthopedics;  Laterality: Left;   SHOULDER ARTHROSCOPY WITH ROTATOR CUFF REPAIR AND SUBACROMIAL DECOMPRESSION Left 11/19/2014   Procedure: LEFT SHOULDER ARTHROSCOPY, DEBRIDEMENT DISTAL CLAVICLE EXCISION, ACROMIOPLASTY WITH ROTATOR CUFF REPAIR ;  Surgeon: Kathryne Hitch, MD;  Location: [place];  Service: Orthopedics;  Laterality: Left;   THROMBECTOMY OF BYPASS GRAFT FEMORAL- POPLITEAL ARTERY Left 05/01/2020   Procedure: THROMBECTOMY OF LOWER EXTREMITY;  Surgeon: Waynetta Sandy, MD;  Location: [place];  Service: Vascular;  Laterality: Left;   TRANSFORAMINAL LUMBAR INTERBODY FUSION (TLIF) WITH PEDICLE SCREW FIXATION 1 LEVEL N/A 01/03/2018   Procedure: TRANSFORAMINAL LUMBAR INTERBODY FUSION (TLIF) LUMBAR FIVE-SACRAL ONE;  Surgeon: Melina Schools, MD;  Location: [place];  Service: Orthopedics;  Laterality: N/A;   ULTRASOUND GUIDANCE FOR VASCULAR ACCESS Right 01/22/2020   Procedure:  ULTRASOUND GUIDANCE FOR VASCULAR ACCESS Right Common Femoral Artery.;  Surgeon: Marty Heck, MD;  Location: [place];  Service: Vascular;  Laterality: Right;   VIDEO ASSISTED THORACOSCOPY (VATS)/DECORTICATION Right 11/21/2011   drainage of empyema   Patient Active Problem List   Diagnosis Date Noted   Arm pain 07/25/2021   Paronychia of great toe 01/28/2021   Onychomycosis 01/28/2021   Critical limb ischemia with history of revascularization of same extremity ([place]) 04/28/2020   History of COVID-19 05/10/2019   PAD (peripheral artery disease) ([place])    History of critical lower limb ischemia 02/19/2019   S/P lumbar fusion, Lower back pain 01/03/2018   Coronary artery disease involving native coronary artery of native heart with unstable angina pectoris ([place])    Preventative health care 03/13/2017   Transaminitis 12/16/2015   Bilateral shoulder pain 08/20/2012   Tobacco use disorder 08/20/2012   HTN (hypertension) 04/20/2011   HLD (hyperlipidemia) 10/08/2009   Rheumatoid arthritis ([place]) 08/11/2009    REFERRING DIAG: left AKA  THERAPY DIAG:  Other abnormalities of gait and mobility  Unsteadiness on feet  Abnormal posture  PERTINENT HISTORY: RA, DM2, HTN, PAD, DDD,  s/p multiple L LE revascularizations discharged from hospital a 05/05/20.  At that time he was deemed to not be a candidate for further revasc procedures.  Dc'd  home with a known bypass occlusion with a plan for AKA if worsening pain. Returned to ED 05/08/20  for increased pain and seeking amputation. s/p L AKA 05/11/20   PRECAUTIONS: fall  SUBJECTIVE: Pt reports he is a little concerned with area between the teeth bites from dog bite on right thigh is now hard. He thinks he may actually go to MD now as PT has been recommending he do.  PAIN:  Are you having pain? Yes NPRS scale: 4/10 Pain location: hips Pain orientation: Right and Left  PAIN TYPE: chronic Pain description: aching  Aggravating factors:  weather, a lot of activity due to arthritis Relieving factors: rest, medication    TODAY'S TREATMENT:  PT observed place on right thigh. No redness noted with healing bruise but was hard area between the teeth bites. Not tender to palpation. Did advise he have MD check still to be safe.  Gait with RW and prosthesis 230' with one seated rest break 2/3 of way due to numbness in right hand. Left hip bothers some but no pain in right thigh today. Pt was cued to shift over prosthesis and stand tall. W/c follow. In // bars: gait with 1 UE support with verbal and tactile cues to decrease left step and shift pelvis over leg to increase stance time with erect posture. Performed 8' x 8 with improvement as went on. Did report increased pain in left hip. Side stepping with BUE support 8' x 6 with cues for upright posture.  Standing balance x 30 sec with no UE support with pt showing good upright posture. Added in throwing 1.1# ball with his friend while PT guarded for 30 sec with no LOB requiring physical assistance. Seated rest breaks between activities.   Prosthetics: Prosthetic care comments: Pt has 10 ply socks with 1 being a cut sock. Has been using antiperspirant but almost out as going through quickly. Donning prosthesis:  Doffing prosthesis:  Prosthetic wear tolerance: 6.5 hours/day, most days unless not feeling well  Prosthetic weight bearing tolerance:  Residual limb condition: pt reports intact skin     PATIENT EDUCATION: Education details: prosthetic education Person educated: Patient Education method: Explanation Education comprehension: verbalized understanding   HOME EXERCISE PROGRAM: [id number]   PT Short Term Goals -       PT SHORT TERM GOAL #1   Title Pt will tolerate wearing prosthesis at least 6 hours/day.    Baseline 07/22/21 currently 3-4 hours/day. 08/19/21 Pt currently wearing prosthesis 5-6 hours/day    Time 4    Period Weeks    Status Achieved    Target Date  08/20/21      PT SHORT TERM GOAL #2   Title Pt will be independent with prosthetic management for improved function.    Baseline 06/24/21 currently needs assistance with sock ply adjustment. 08/19/21 Pt is independent with donning/doffing prosthesis. Able to adjust socks now on own.    Time 4    Period Weeks    Status Achieved    Target Date 08/20/21      PT SHORT TERM GOAL #3   Title Pt will be able to ambulate 200' with RW with prosthetis locked out on level surfaces supervision for improved household mobility.    Baseline 07/22/21 115' with RW with prosthesis locked out CGA. 08/19/21 230' with RW supervision/CGA    Time 4    Period Weeks    Status Partially Met    Target Date 08/20/21      PT SHORT  TERM GOAL #4   Title Pt will increase Berg to >28/56 for improved balance.    Baseline 07/22/21 25/56. 08/19/21 30/56    Time 4    Period Weeks    Status Achieved    Target Date 08/20/21              PT Long Term Goals -       PT LONG TERM GOAL #1   Title Pt will be independent with progressive HEP for strengthening and balance to continue gains on own.(LTGs due 09/16/21)    Time 8    Period Weeks    Status Revised    Target Date 09/16/21      PT LONG TERM GOAL #2   Title Pt will increase gait speed to >0.43ms for improved household mobility    Baseline 04/27/21 0.136m. 07/22/21 0.2243m   Time 8    Period Weeks    Status Revised    Target Date 09/16/21      PT LONG TERM GOAL #3   Title Pt will be able to ambulate 115' mod I with RW with prosthesis locked versus unlocked if safe for improved household mobility.    Baseline 04/27/21 75' min assit with RW. 07/22/21 115' with RW CGA locked out    Time 8    Period Weeks    Status New    Target Date 09/16/21      PT LONG TERM GOAL #4   Title Pt will ambulate up/down 4 steps with rails, up/down curb and ramp, mod I for improved community access with prosthesis locked out.    Baseline unable    Time 8    Period Weeks     Status On-going    Target Date 09/16/21              Plan -     Clinical Impression Statement Pt continues to demonstrate more upright posture with activities. Needs cues to decrease left step length to shift over leg more. Better balance without UE support with being able to perform dynamic task with tosing ball.    Personal Factors and Comorbidities Comorbidity 3+;Time since onset of injury/illness/exacerbation    Comorbidities RA, DM2, HTN, PAD, DDD,  s/p multiple L LE revascularizations discharged from hospital a 05/05/20.  At that time he was deemed to not be a candidate for further revasc procedures.  Dc'd home with a known bypass occlusion with a plan for AKA if worsening pain. Returned to ED 05/08/20  for increased pain and seeking amputation. s/p L AKA 05/11/20. Pt reports 3 shoulder surgeries on each shoulder and low back surgery    Examination-Activity Limitations Bathing;Locomotion Level;Transfers;Stairs;Stand;Squat;Lift    Examination-Participation Restrictions [employer];Meal Prep    Stability/Clinical Decision Making Evolving/Moderate complexity    Rehab Potential Good    PT Frequency 2x / week    PT Duration 8 weeks    PT Treatment/Interventions ADLs/Self Care Home Management;DME Instruction;Gait training;Stair training;Functional mobility training;Therapeutic activities;Therapeutic exercise;Balance training;Neuromuscular re-education;Manual techniques;Passive range of motion;Prosthetic Training;Patient/family education;Vestibular    PT Next Visit Plan How is right leg doing from dog bite? Did he see doctor? Continue standing balance and working on upright posture.  GAIT WITH PROSTHESIS LOCKED OUT RIGHT NOW as unable to lock on own, work on weight shifting over prosthesis. Continue to work on bringing pelvis forward over prosthesis.  Still unable to lock prosthesis if knee unlocked at this time.  Prosthetic education. Gait in // bars with 1 UE support.  Consulted and Agree with  Plan of Care Patient;Other (Comment)   friend, Norva Pavlov, PT, DPT, NCS 08/26/2021, 3:44 PM

## 2021-08-30 ENCOUNTER — Ambulatory Visit: Payer: Medicare Other

## 2021-08-30 DIAGNOSIS — M4326 Fusion of spine, lumbar region: Secondary | ICD-10-CM | POA: Diagnosis not present

## 2021-08-30 DIAGNOSIS — G894 Chronic pain syndrome: Secondary | ICD-10-CM | POA: Diagnosis not present

## 2021-08-30 DIAGNOSIS — M5136 Other intervertebral disc degeneration, lumbar region: Secondary | ICD-10-CM | POA: Diagnosis not present

## 2021-08-30 DIAGNOSIS — G8929 Other chronic pain: Secondary | ICD-10-CM | POA: Diagnosis not present

## 2021-09-02 ENCOUNTER — Encounter: Payer: Self-pay | Admitting: Physical Therapy

## 2021-09-02 ENCOUNTER — Ambulatory Visit: Payer: Medicare Other | Admitting: Physical Therapy

## 2021-09-02 ENCOUNTER — Other Ambulatory Visit: Payer: Self-pay

## 2021-09-02 DIAGNOSIS — R2689 Other abnormalities of gait and mobility: Secondary | ICD-10-CM

## 2021-09-02 DIAGNOSIS — R2681 Unsteadiness on feet: Secondary | ICD-10-CM | POA: Diagnosis not present

## 2021-09-02 DIAGNOSIS — R293 Abnormal posture: Secondary | ICD-10-CM | POA: Diagnosis not present

## 2021-09-02 DIAGNOSIS — M6281 Muscle weakness (generalized): Secondary | ICD-10-CM | POA: Diagnosis not present

## 2021-09-02 NOTE — Therapy (Signed)
OUTPATIENT PHYSICAL THERAPY TREATMENT NOTE   Patient Name: [name] [name] MRN: [id number] DOB:March 08, 1961, 61 y.o., male Today's Date: 09/02/2021  PCP: Scarlett Presto, MD REFERRING PROVIDER: Angelia Mould,*   PT End of Session - 09/02/21 1235     Visit Number 22    Number of Visits 30    Date for PT Re-Evaluation 09/16/21    Authorization Type UHC medicare with medicaid secondary so 10th visit progress note    PT Start Time 1233    PT Stop Time 1313    PT Time Calculation (min) 40 min    Equipment Utilized During Treatment Gait belt    Activity Tolerance Patient tolerated treatment well;Patient limited by pain    Behavior During Therapy [facility] for tasks assessed/performed             Past Medical History:  Diagnosis Date   Anemia    Anemia 08/06/2012   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   Hypertension    states is borderline on medication; has been on med. x 5-6 yr.   Hypokalemia 05/30/2018   Impingement syndrome of shoulder region 10/2014   left   Insomnia 06/01/2015   Ischemic ulcer of toe of left foot (HCC)    Left shoulder pain 12/16/2015   Low back pain without sciatica 10/29/2009   Lupus ([place])    Peripheral vascular disease (HCC)    Pneumonia    RA (rheumatoid arthritis) ([place])    Rotator cuff tear 11/04/2014   Past Surgical History:  Procedure Laterality Date   ABDOMINAL AORTAGRAM  02/20/2019   ABDOMINAL AORTOGRAM W/LOWER EXTREMITY N/A 02/20/2019   Procedure: ABDOMINAL AORTOGRAM W/LOWER EXTREMITY;  Surgeon: Marty Heck, MD;  Location: [place] [facility];  Service: Cardiovascular;  Laterality: N/A;   ACHILLES TENDON SURGERY Bilateral    AMPUTATION Left 05/11/2020   Procedure: LEFT ABOVE KNEE AMPUTATION;  Surgeon: Angelia Mould, MD;  Location:  [place];  Service: Vascular;  Laterality: Left;   ENDARTERECTOMY POPLITEAL Left 05/01/2020   Procedure: ENDARTERECTOMY POPLITEAL;  Surgeon: Waynetta Sandy, MD;  Location: [place];  Service: Vascular;  Laterality: Left;   FASCIOTOMY Left 05/01/2020   Procedure: FASCIOTOMY;  Surgeon: Waynetta Sandy, MD;  Location: [place];  Service: Vascular;  Laterality: Left;   FEMORAL-POPLITEAL BYPASS GRAFT Left 02/26/2019   Procedure: BYPASS GRAFT FEMORAL-POPLITEAL ARTERY LEFT LEG USING 51m PROPATEN GRAFT;  Surgeon: BSerafina Mitchell MD;  [place]  Service: Vascular;  Laterality: Left;   INTRAOPERATIVE ARTERIOGRAM Left 01/22/2020   Procedure: INTRA OPERATIVE ARTERIOGRAM with administration of thrombolyics in Left femoral to popliteal bypass.;  Surgeon: CMarty Heck MD;  [place]  Service: Vascular;  Laterality: Left;   LEFT HEART CATH AND CORONARY ANGIOGRAPHY N/A 03/15/2017   Procedure: LEFT HEART CATH AND CORONARY ANGIOGRAPHY;  Surgeon: ENelva Bush MD;  [facility];  Service: Cardiovascular;  Laterality: N/A;   LOWER EXTREMITY INTERVENTION Left 04/29/2020   Procedure: LOWER EXTREMITY INTERVENTION- LYSIS;  Surgeon: CMarty Heck MD;  [place] [facility];  Service: Cardiovascular;  Laterality: Left;   OSTEOTOMY AND ULNAR SHORTENING Right 07/22/2002   PATCH ANGIOPLASTY Left 05/01/2020   Procedure: PATCH ANGIOPLASTY USING HEMASHIELD PLATINUM FINESSE PATCH;  Surgeon: Waynetta Sandy, MD;  Location: [place];  Service: Vascular;  Laterality: Left;   PERIPHERAL VASCULAR ATHERECTOMY  01/23/2020   Procedure: PERIPHERAL VASCULAR ATHERECTOMY;  Surgeon: Marty Heck, MD;  Location: [place] [facility];  Service: Cardiovascular;;   PERIPHERAL VASCULAR BALLOON ANGIOPLASTY  01/23/2020   Procedure: PERIPHERAL VASCULAR BALLOON ANGIOPLASTY;  Surgeon: Marty Heck, MD;  Location: [place] [facility];  Service: Cardiovascular;;   PERIPHERAL  VASCULAR INTERVENTION Left 04/30/2020   Procedure: PERIPHERAL VASCULAR INTERVENTION;  Surgeon: Cherre Robins, MD;  Location: [place] [facility];  Service: Cardiovascular;  Laterality: Left;   PERIPHERAL VASCULAR THROMBECTOMY N/A 01/23/2020   Procedure: lysis recheck;  Surgeon: Marty Heck, MD;  Location: [place] [facility];  Service: Cardiovascular;  Laterality: N/A;  + [place]    PERIPHERAL VASCULAR THROMBECTOMY N/A 04/30/2020   Procedure: Lysis Recheck;  Surgeon: Cherre Robins, MD;  Location: [place] [facility];  Service: Cardiovascular;  Laterality: N/A;   SHOULDER ARTHROSCOPY WITH BICEPSTENOTOMY Right 03/11/2015   Procedure: SHOULDER ARTHROSCOPY WITH BICEPSTENOTOMY;  Surgeon: Ninetta Lights, MD;  Location: [place];  Service: Orthopedics;  Laterality: Right;   SHOULDER ARTHROSCOPY WITH DISTAL CLAVICLE RESECTION Left 11/19/2014   Procedure: SHOULDER ARTHROSCOPY WITH DISTAL CLAVICLE RESECTION;  Surgeon: Kathryne Hitch, MD;  Location: [place];  Service: Orthopedics;  Laterality: Left;   SHOULDER ARTHROSCOPY WITH ROTATOR CUFF REPAIR AND SUBACROMIAL DECOMPRESSION Left 11/19/2014   Procedure: LEFT SHOULDER ARTHROSCOPY, DEBRIDEMENT DISTAL CLAVICLE EXCISION, ACROMIOPLASTY WITH ROTATOR CUFF REPAIR ;  Surgeon: Kathryne Hitch, MD;  Location: [place];  Service: Orthopedics;  Laterality: Left;   THROMBECTOMY OF BYPASS GRAFT FEMORAL- POPLITEAL ARTERY Left 05/01/2020   Procedure: THROMBECTOMY OF LOWER EXTREMITY;  Surgeon: Waynetta Sandy, MD;  Location: [place];  Service: Vascular;  Laterality: Left;   TRANSFORAMINAL LUMBAR INTERBODY FUSION (TLIF) WITH PEDICLE SCREW FIXATION 1 LEVEL N/A 01/03/2018   Procedure: TRANSFORAMINAL LUMBAR INTERBODY FUSION (TLIF) LUMBAR FIVE-SACRAL ONE;  Surgeon: Melina Schools, MD;  Location: [place];  Service: Orthopedics;  Laterality: N/A;   ULTRASOUND GUIDANCE FOR VASCULAR ACCESS Right 01/22/2020   Procedure:  ULTRASOUND GUIDANCE FOR VASCULAR ACCESS Right Common Femoral Artery.;  Surgeon: Marty Heck, MD;  Location: [place];  Service: Vascular;  Laterality: Right;   VIDEO ASSISTED THORACOSCOPY (VATS)/DECORTICATION Right 11/21/2011   drainage of empyema   Patient Active Problem List   Diagnosis Date Noted   Arm pain 07/25/2021   Paronychia of great toe 01/28/2021   Onychomycosis 01/28/2021   Critical limb ischemia with history of revascularization of same extremity ([place]) 04/28/2020   History of COVID-19 05/10/2019   PAD (peripheral artery disease) ([place])    History of critical lower limb ischemia 02/19/2019   S/P lumbar fusion, Lower back pain 01/03/2018   Coronary artery disease involving native coronary artery of native heart with unstable angina pectoris ([place])    Preventative health care 03/13/2017   Transaminitis 12/16/2015   Bilateral shoulder pain 08/20/2012   Tobacco use disorder 08/20/2012   HTN (hypertension) 04/20/2011   HLD (hyperlipidemia) 10/08/2009   Rheumatoid arthritis ([place]) 08/11/2009    REFERRING DIAG: left AKA  THERAPY DIAG:  Other abnormalities of gait and mobility  Unsteadiness on feet  Abnormal posture  Muscle weakness (generalized)  PERTINENT HISTORY: RA, DM2, HTN, PAD, DDD,  s/p multiple L LE revascularizations discharged from hospital a 05/05/20.  At that time he was deemed to not be a candidate for further  revasc procedures.  Dc'd home with a known bypass occlusion with a plan for AKA if worsening pain. Returned to ED 05/08/20  for increased pain and seeking amputation. s/p L AKA 05/11/20   PRECAUTIONS: fall  SUBJECTIVE: Pt reports seeing MD about thigh. Everything checked out okay and is mostly healed. Goes next week for MRI of neck and shoulder. No falls. Has been walking around home with walker. No falls.   PAIN:  Are you having pain? Yes NPRS scale: 4/10 Pain location: hips Pain orientation: Right and Left  PAIN TYPE: chronic Pain  description: aching  Aggravating factors: weather, a lot of activity due to arthritis Relieving factors: rest, medication    TODAY'S TREATMENT:   09/02/2021: CURRENT PROSTHETIC WEAR ASSESSMENT: Prosthetic wear tolerance: 5-6 hours/day, daily days/week Residual limb condition: intact per pt report Prosthetic care comments: pt able to lock prosthesis in standing with supervision. Currently wearing 8 ply with no rotation noted with session.     RAMP:  Level of Assistance: Min A  Assistive device utilized: Environmental consultant - 2 wheeled and prosthesis with knee locked out Ramp Comments: cues on sequencing, posture and walker position. Performed x 1 rep.  CURB:  Level of Assistance: Min A Assistive device utilized: Environmental consultant - 2 wheeled and prosthesis with knee locked out Curb Comments: cues on stance position to advance walker and sequencing with descending and then ascending 6 inch curb x 1 reps.    GAIT: Gait pattern: step through pattern, decreased stride length, and trunk flexed Distance walked: 230 x1, plus short distances in session around gym Assistive device utilized: Walker - 2 wheeled and prosthesis Level of assistance: CGA and Min A Comments: cues for decreased step length on prosthetic side and increased step length on right LE, cues for upright posture and cues on walker position with gait.       08/26/2021: PT observed place on right thigh. No redness noted with healing bruise but was hard area between the teeth bites. Not tender to palpation. Did advise he have MD check still to be safe.  Gait with RW and prosthesis 230' with one seated rest break 2/3 of way due to numbness in right hand. Left hip bothers some but no pain in right thigh today. Pt was cued to shift over prosthesis and stand tall. W/c follow. In // bars: gait with 1 UE support with verbal and tactile cues to decrease left step and shift pelvis over leg to increase stance time with erect posture. Performed 8' x 8 with  improvement as went on. Did report increased pain in left hip. Side stepping with BUE support 8' x 6 with cues for upright posture.  Standing balance x 30 sec with no UE support with pt showing good upright posture. Added in throwing 1.1# ball with his friend while PT guarded for 30 sec with no LOB requiring physical assistance. Seated rest breaks between activities.   Prosthetics: Prosthetic care comments: Pt has 10 ply socks with 1 being a cut sock. Has been using antiperspirant but almost out as going through quickly. Donning prosthesis:  Doffing prosthesis:  Prosthetic wear tolerance: 6.5 hours/day, most days unless not feeling well  Prosthetic weight bearing tolerance:  Residual limb condition: pt reports intact skin     PATIENT EDUCATION: Education details: ramp and curb with prosthesis/walker Person educated: Patient Education method: Explanation, Demonstration, and Verbal cues Education comprehension: verbalized understanding, returned demonstration, verbal cues required, and needs further education   HOME EXERCISE PROGRAM: [id number]  PT Short Term Goals -       PT SHORT TERM GOAL #1   Title Pt will tolerate wearing prosthesis at least 6 hours/day.    Baseline 07/22/21 currently 3-4 hours/day. 08/19/21 Pt currently wearing prosthesis 5-6 hours/day    Time 4    Period Weeks    Status Achieved    Target Date 08/20/21      PT SHORT TERM GOAL #2   Title Pt will be independent with prosthetic management for improved function.    Baseline 06/24/21 currently needs assistance with sock ply adjustment. 08/19/21 Pt is independent with donning/doffing prosthesis. Able to adjust socks now on own.    Time 4    Period Weeks    Status Achieved    Target Date 08/20/21      PT SHORT TERM GOAL #3   Title Pt will be able to ambulate 200' with RW with prosthetis locked out on level surfaces supervision for improved household mobility.    Baseline 07/22/21 115' with RW with prosthesis  locked out CGA. 08/19/21 230' with RW supervision/CGA    Time 4    Period Weeks    Status Partially Met    Target Date 08/20/21      PT SHORT TERM GOAL #4   Title Pt will increase Berg to >28/56 for improved balance.    Baseline 07/22/21 25/56. 08/19/21 30/56    Time 4    Period Weeks    Status Achieved    Target Date 08/20/21              PT Long Term Goals -       PT LONG TERM GOAL #1   Title Pt will be independent with progressive HEP for strengthening and balance to continue gains on own.(LTGs due 09/16/21)    Time 8    Period Weeks    Status Revised    Target Date 09/16/21      PT LONG TERM GOAL #2   Title Pt will increase gait speed to >0.80ms for improved household mobility    Baseline 04/27/21 0.165m. 07/22/21 0.2253m   Time 8    Period Weeks    Status Revised    Target Date 09/16/21      PT LONG TERM GOAL #3   Title Pt will be able to ambulate 115' mod I with RW with prosthesis locked versus unlocked if safe for improved household mobility.    Baseline 04/27/21 75' min assit with RW. 07/22/21 115' with RW CGA locked out    Time 8    Period Weeks    Status New    Target Date 09/16/21      PT LONG TERM GOAL #4   Title Pt will ambulate up/down 4 steps with rails, up/down curb and ramp, mod I for improved community access with prosthesis locked out.    Baseline unable    Time 8    Period Weeks    Status On-going    Target Date 09/16/21              Plan -     Clinical Impression Statement Skilled session continued to focus on prosthetic management and gait with prosthesis locked out/RW. Standing rest breaks needed with gait due to numbness on right UE from weight bearing to off load UE. Also initiated training on ramp and curb with session with up to min assist needed and cues on technique. Will benefit from additional training. The pt is  progressing and should benefit from continued PT to progress toward unmet goals.    Personal Factors and Comorbidities  Comorbidity 3+;Time since onset of injury/illness/exacerbation    Comorbidities RA, DM2, HTN, PAD, DDD,  s/p multiple L LE revascularizations discharged from hospital a 05/05/20.  At that time he was deemed to not be a candidate for further revasc procedures.  Dc'd home with a known bypass occlusion with a plan for AKA if worsening pain. Returned to ED 05/08/20  for increased pain and seeking amputation. s/p L AKA 05/11/20. Pt reports 3 shoulder surgeries on each shoulder and low back surgery    Examination-Activity Limitations Bathing;Locomotion Level;Transfers;Stairs;Stand;Squat;Lift    Examination-Participation Restrictions Church;Meal Prep    Stability/Clinical Decision Making Evolving/Moderate complexity    Rehab Potential Good    PT Frequency 2x / week    PT Duration 8 weeks    PT Treatment/Interventions ADLs/Self Care Home Management;DME Instruction;Gait training;Stair training;Functional mobility training;Therapeutic activities;Therapeutic exercise;Balance training;Neuromuscular re-education;Manual techniques;Passive range of motion;Prosthetic Training;Patient/family education;Vestibular    PT Next Visit Plan Continue standing balance and working on upright posture.  GAIT WITH PROSTHESIS LOCKED OUT RIGHT NOW as unable to lock on own, work on weight shifting over prosthesis. Continue to work on bringing pelvis forward over prosthesis.  Still unable to lock prosthesis if knee unlocked at this time.  Prosthetic education. Gait in // bars with 1 UE support.    Consulted and Agree with Plan of Care Patient;Other (Comment)   Sister in car              Willow Ora, [place], [facility] [address] [place], [place] [address] [phone] 09/02/21, 4:35 PM

## 2021-09-06 ENCOUNTER — Ambulatory Visit: Payer: Medicare Other

## 2021-09-09 ENCOUNTER — Other Ambulatory Visit: Payer: Self-pay

## 2021-09-09 ENCOUNTER — Ambulatory Visit: Payer: Medicare Other

## 2021-09-09 VITALS — BP 122/82

## 2021-09-09 DIAGNOSIS — R2681 Unsteadiness on feet: Secondary | ICD-10-CM

## 2021-09-09 DIAGNOSIS — M6281 Muscle weakness (generalized): Secondary | ICD-10-CM

## 2021-09-09 DIAGNOSIS — R293 Abnormal posture: Secondary | ICD-10-CM | POA: Diagnosis not present

## 2021-09-09 DIAGNOSIS — R2689 Other abnormalities of gait and mobility: Secondary | ICD-10-CM | POA: Diagnosis not present

## 2021-09-09 NOTE — Therapy (Signed)
?OUTPATIENT PHYSICAL THERAPY TREATMENT NOTE ? ? ?Patient Name: [name] [name] ?MRN: [id number] ?DOB:[birth year], 61 y.o., male ?Today's Date: 09/09/2021 ? ?PCP: Scarlett Presto, MD ?REFERRING PROVIDER: Angelia Mould,* ? ? PT End of Session - 09/09/21 1047   ? ? Visit Number 23   ? Number of Visits 30   ? Date for PT Re-Evaluation 09/16/21   ? Authorization Type UHC medicare with medicaid secondary so 10th visit progress note   ? PT Start Time 1045   ? PT Stop Time 6789   ? PT Time Calculation (min) 47 min   ? Equipment Utilized During Treatment Gait belt   ? Activity Tolerance Patient tolerated treatment well;Patient limited by pain   ? Behavior During Therapy [facility] for tasks assessed/performed   ? ?  ?  ? ?  ? ? ?Past Medical History:  ?Diagnosis Date  ? Anemia   ? Anemia 08/06/2012  ? Aortic valve mass 12/30/2015  ? Community acquired pneumonia 11/07/2011  ? DDD (degenerative disc disease), lumbosacral   ?  and grade 2 slip  ? GERD (gastroesophageal reflux disease)   ? Heart murmur   ? History of anemia   ? no current med.  ? Hypertension   ? states is borderline on medication; has been on med. x 5-6 yr.  ? Hypokalemia 05/30/2018  ? Impingement syndrome of shoulder region 10/2014  ? left  ? Insomnia 06/01/2015  ? Ischemic ulcer of toe of left foot ([place])   ? Left shoulder pain 12/16/2015  ? Low back pain without sciatica 10/29/2009  ? Lupus ([place])   ? Peripheral vascular disease ([place])   ? Pneumonia   ? RA (rheumatoid arthritis) ([place])   ? Rotator cuff tear 11/04/2014  ? ?Past Surgical History:  ?Procedure Laterality Date  ? ABDOMINAL AORTAGRAM  02/20/2019  ? ABDOMINAL AORTOGRAM W/LOWER EXTREMITY N/A 02/20/2019  ? Procedure: ABDOMINAL AORTOGRAM W/LOWER EXTREMITY;  Surgeon: Marty Heck, MD;  Location: [place] [facility];  Service: Cardiovascular;  Laterality: N/A;  ? ACHILLES TENDON SURGERY Bilateral   ? AMPUTATION Left 05/11/2020  ? Procedure: LEFT ABOVE KNEE AMPUTATION;  Surgeon: Angelia Mould, MD;  Location:  [place];  Service: Vascular;  Laterality: Left;  ? ENDARTERECTOMY POPLITEAL Left 05/01/2020  ? Procedure: ENDARTERECTOMY POPLITEAL;  Surgeon: Waynetta Sandy, MD;  Location: [place];  Service: Vascular;  Laterality: Left;  ? FASCIOTOMY Left 05/01/2020  ? Procedure: FASCIOTOMY;  Surgeon: Waynetta Sandy, MD;  Location: [place];  Service: Vascular;  Laterality: Left;  ? FEMORAL-POPLITEAL BYPASS GRAFT Left 02/26/2019  ? Procedure: BYPASS GRAFT FEMORAL-POPLITEAL ARTERY LEFT LEG USING 66m PROPATEN GRAFT;  Surgeon: BSerafina Mitchell MD;  [place]  Service: Vascular;  Laterality: Left;  ? INTRAOPERATIVE ARTERIOGRAM Left 01/22/2020  ? Procedure: INTRA OPERATIVE ARTERIOGRAM with administration of thrombolyics in Left femoral to popliteal bypass.;  Surgeon: CMarty Heck MD;  [place]  Service: Vascular;  Laterality: Left;  ? LEFT HEART CATH AND CORONARY ANGIOGRAPHY N/A 03/15/2017  ? Procedure: LEFT HEART CATH AND CORONARY ANGIOGRAPHY;  Surgeon: ENelva Bush MD;  [facility];  Service: Cardiovascular;  Laterality: N/A;  ? LOWER EXTREMITY INTERVENTION Left 04/29/2020  ? Procedure: LOWER EXTREMITY INTERVENTION- LYSIS;  Surgeon: CMarty Heck MD;  [place];  Service: Cardiovascular;  Laterality: Left;  ? OSTEOTOMY AND ULNAR SHORTENING Right 07/22/2002  ? PATCH ANGIOPLASTY Left 05/01/2020  ? Procedure: PATCH ANGIOPLASTY USING HEMASHIELD PLATINUM FINESSE PATCH;  Surgeon: Waynetta Sandy, MD;  Location: [place];  Service: Vascular;  Laterality: Left;  ? PERIPHERAL VASCULAR ATHERECTOMY  01/23/2020  ? Procedure: PERIPHERAL VASCULAR ATHERECTOMY;  Surgeon: Marty Heck, MD;  Location: [place] [facility];  Service: Cardiovascular;;  ? PERIPHERAL VASCULAR BALLOON ANGIOPLASTY  01/23/2020  ? Procedure: PERIPHERAL VASCULAR BALLOON ANGIOPLASTY;  Surgeon: Marty Heck, MD;  Location: [place] [facility];  Service: Cardiovascular;;  ? PERIPHERAL  VASCULAR INTERVENTION Left 04/30/2020  ? Procedure: PERIPHERAL VASCULAR INTERVENTION;  Surgeon: Cherre Robins, MD;  Location: [place] [facility];  Service: Cardiovascular;  Laterality: Left;  ? PERIPHERAL VASCULAR THROMBECTOMY N/A 01/23/2020  ? Procedure: lysis recheck;  Surgeon: Marty Heck, MD;  Location: [place] [facility];  Service: Cardiovascular;  Laterality: N/A;  + [place] ?  ? PERIPHERAL VASCULAR THROMBECTOMY N/A 04/30/2020  ? Procedure: Lysis Recheck;  Surgeon: Cherre Robins, MD;  Location: [place] [facility];  Service: Cardiovascular;  Laterality: N/A;  ? SHOULDER ARTHROSCOPY WITH BICEPSTENOTOMY Right 03/11/2015  ? Procedure: SHOULDER ARTHROSCOPY WITH BICEPSTENOTOMY;  Surgeon: Ninetta Lights, MD;  Location: [place];  Service: Orthopedics;  Laterality: Right;  ? SHOULDER ARTHROSCOPY WITH DISTAL CLAVICLE RESECTION Left 11/19/2014  ? Procedure: SHOULDER ARTHROSCOPY WITH DISTAL CLAVICLE RESECTION;  Surgeon: Kathryne Hitch, MD;  Location: [place];  Service: Orthopedics;  Laterality: Left;  ? SHOULDER ARTHROSCOPY WITH ROTATOR CUFF REPAIR AND SUBACROMIAL DECOMPRESSION Left 11/19/2014  ? Procedure: LEFT SHOULDER ARTHROSCOPY, DEBRIDEMENT DISTAL CLAVICLE EXCISION, ACROMIOPLASTY WITH ROTATOR CUFF REPAIR ;  Surgeon: Kathryne Hitch, MD;  Location: [place];  Service: Orthopedics;  Laterality: Left;  ? THROMBECTOMY OF BYPASS GRAFT FEMORAL- POPLITEAL ARTERY Left 05/01/2020  ? Procedure: THROMBECTOMY OF LOWER EXTREMITY;  Surgeon: Waynetta Sandy, MD;  Location: [place];  Service: Vascular;  Laterality: Left;  ? TRANSFORAMINAL LUMBAR INTERBODY FUSION (TLIF) WITH PEDICLE SCREW FIXATION 1 LEVEL N/A 01/03/2018  ? Procedure: TRANSFORAMINAL LUMBAR INTERBODY FUSION (TLIF) LUMBAR FIVE-SACRAL ONE;  Surgeon: Melina Schools, MD;  Location: [place];  Service: Orthopedics;  Laterality: N/A;  ? ULTRASOUND GUIDANCE FOR VASCULAR ACCESS Right 01/22/2020  ? Procedure:  ULTRASOUND GUIDANCE FOR VASCULAR ACCESS Right Common Femoral Artery.;  Surgeon: Marty Heck, MD;  Location: [place];  Service: Vascular;  Laterality: Right;  ? VIDEO ASSISTED THORACOSCOPY (VATS)/DECORTICATION Right 11/21/2011  ? drainage of empyema  ? ?Patient Active Problem List  ? Diagnosis Date Noted  ? Arm pain 07/25/2021  ? Paronychia of great toe 01/28/2021  ? Onychomycosis 01/28/2021  ? Critical limb ischemia with history of revascularization of same extremity ([place]) 04/28/2020  ? History of COVID-19 05/10/2019  ? PAD (peripheral artery disease) ([place])   ? History of critical lower limb ischemia 02/19/2019  ? S/P lumbar fusion, Lower back pain 01/03/2018  ? Coronary artery disease involving native coronary artery of native heart with unstable angina pectoris ([place])   ? Preventative health care 03/13/2017  ? Transaminitis 12/16/2015  ? Bilateral shoulder pain 08/20/2012  ? Tobacco use disorder 08/20/2012  ? HTN (hypertension) 04/20/2011  ? HLD (hyperlipidemia) 10/08/2009  ? Rheumatoid arthritis ([place]) 08/11/2009  ? ? ?REFERRING DIAG: left AKA ? ?THERAPY DIAG:  ?Other abnormalities of gait and mobility ? ?Muscle weakness (generalized) ? ?Unsteadiness on feet ? ?PERTINENT HISTORY: RA, DM2, HTN, PAD, DDD,  s/p multiple L LE revascularizations discharged from hospital a 05/05/20.  At that time he was deemed to not be a candidate for further revasc procedures.  Dc'd home with a known bypass occlusion with a plan for AKA if worsening pain. Returned to ED 05/08/20  for increased pain and seeking amputation. s/p L AKA 05/11/20  ? ?PRECAUTIONS: fall ? ?SUBJECTIVE: Pt reports that he got bit by neighbors pit bull in hand. Pt has bite marks and scabbing around 2nd knuckle on right hand and phalanx. Police did take the dog to quarantine it for a week and it is up to date on shots. Pt cleaned hand good and reports it looks better than yesterday. Pt has been out of his pain meds, BP and blood thinner meds since 3/7 as  apparently there is a shortage. ? ?PAIN:  ?Are you having pain? Yes ?NPRS scale: 6/10 ?Pain location: hips and low back ?Pain orientation: Right and Left  ?PAIN TYPE: chronic ?Pain description: aching  ?Aggr

## 2021-09-13 ENCOUNTER — Ambulatory Visit: Payer: Medicare Other

## 2021-09-16 ENCOUNTER — Other Ambulatory Visit: Payer: Self-pay | Admitting: Internal Medicine

## 2021-09-16 ENCOUNTER — Ambulatory Visit: Payer: Medicare Other

## 2021-09-16 ENCOUNTER — Other Ambulatory Visit: Payer: Self-pay

## 2021-09-16 DIAGNOSIS — R2689 Other abnormalities of gait and mobility: Secondary | ICD-10-CM

## 2021-09-16 NOTE — Therapy (Signed)
?OUTPATIENT PHYSICAL THERAPY TREATMENT NOTE- arrived no charge ? ? ?Patient Name: [name] [name] ?MRN: [id number] ?DOB:Jul 13, 1960, 61 y.o., male ?Today's Date: 09/16/2021 ? ?PCP: Scarlett Presto, MD ?REFERRING PROVIDER: Angelia Mould,* ? ? PT End of Session - 09/16/21 1450   ? ? Visit Number 23   ? Number of Visits 30   ? Date for PT Re-Evaluation 09/16/21   ? Authorization Type [employer] medicare with medicaid secondary so 10th visit progress note   ? PT Start Time 1448   ? PT Stop Time 1500   arrived no charge  ? PT Time Calculation (min) 12 min   ? Equipment Utilized During Treatment Gait belt   ? Activity Tolerance Patient tolerated treatment well;Patient limited by pain   ? Behavior During Therapy [facility] for tasks assessed/performed   ? ?  ?  ? ?  ? ? ?Past Medical History:  ?Diagnosis Date  ? Anemia   ? Anemia 08/06/2012  ? Aortic valve mass 12/30/2015  ? Community acquired pneumonia 11/07/2011  ? DDD (degenerative disc disease), lumbosacral   ?  and grade 2 slip  ? GERD (gastroesophageal reflux disease)   ? Heart murmur   ? History of anemia   ? no current med.  ? Hypertension   ? states is borderline on medication; has been on med. x 5-6 yr.  ? Hypokalemia 05/30/2018  ? Impingement syndrome of shoulder region 10/2014  ? left  ? Insomnia 06/01/2015  ? Ischemic ulcer of toe of left foot ([place])   ? Left shoulder pain 12/16/2015  ? Low back pain without sciatica 10/29/2009  ? Lupus ([place])   ? Peripheral vascular disease ([place])   ? Pneumonia   ? RA (rheumatoid arthritis) ([place])   ? Rotator cuff tear 11/04/2014  ? ?Past Surgical History:  ?Procedure Laterality Date  ? ABDOMINAL AORTAGRAM  02/20/2019  ? ABDOMINAL AORTOGRAM W/LOWER EXTREMITY N/A 02/20/2019  ? Procedure: ABDOMINAL AORTOGRAM W/LOWER EXTREMITY;  Surgeon: Marty Heck, MD;  Location: [place] [facility];  Service: Cardiovascular;  Laterality: N/A;  ? ACHILLES TENDON SURGERY Bilateral   ? AMPUTATION Left 05/11/2020  ? Procedure: LEFT ABOVE KNEE AMPUTATION;  Surgeon:  Angelia Mould, MD;  Location: [place];  Service: Vascular;  Laterality: Left;  ? ENDARTERECTOMY POPLITEAL Left 05/01/2020  ? Procedure: ENDARTERECTOMY POPLITEAL;  Surgeon: Waynetta Sandy, MD;  Location: [place];  Service: Vascular;  Laterality: Left;  ? FASCIOTOMY Left 05/01/2020  ? Procedure: FASCIOTOMY;  Surgeon: Waynetta Sandy, MD;  Location: [place];  Service: Vascular;  Laterality: Left;  ? FEMORAL-POPLITEAL BYPASS GRAFT Left 02/26/2019  ? Procedure: BYPASS GRAFT FEMORAL-POPLITEAL ARTERY LEFT LEG USING 39m PROPATEN GRAFT;  Surgeon: BSerafina Mitchell MD;  Location[place]  Service: Vascular;  Laterality: Left;  ? INTRAOPERATIVE ARTERIOGRAM Left 01/22/2020  ? Procedure: INTRA OPERATIVE ARTERIOGRAM with administration of thrombolyics in Left femoral to popliteal bypass.;  Surgeon: CMarty Heck MD;  [place]  Service: Vascular;  Laterality: Left;  ? LEFT HEART CATH AND CORONARY ANGIOGRAPHY N/A 03/15/2017  ? Procedure: LEFT HEART CATH AND CORONARY ANGIOGRAPHY;  Surgeon: ENelva Bush MD;  [place] [facility];  Service: Cardiovascular;  Laterality: N/A;  ? LOWER EXTREMITY INTERVENTION Left 04/29/2020  ? Procedure: LOWER EXTREMITY INTERVENTION- LYSIS;  Surgeon: CMarty Heck MD;  [place];  Service: Cardiovascular;  Laterality: Left;  ? OSTEOTOMY AND ULNAR SHORTENING Right 07/22/2002  ? PATCH ANGIOPLASTY Left 05/01/2020  ? Procedure:  [place] PLATINUM FINESSE PATCH;  Surgeon: Waynetta Sandy, MD;  Location: [place];  Service: Vascular;  Laterality: Left;  ? PERIPHERAL VASCULAR ATHERECTOMY  01/23/2020  ? Procedure: PERIPHERAL VASCULAR ATHERECTOMY;  Surgeon: Marty Heck, MD;  Location: [place] [facility];  Service: Cardiovascular;;  ? PERIPHERAL VASCULAR BALLOON ANGIOPLASTY  01/23/2020  ? Procedure: PERIPHERAL VASCULAR BALLOON ANGIOPLASTY;  Surgeon: Marty Heck, MD;  Location: [place] [facility];   Service: Cardiovascular;;  ? PERIPHERAL VASCULAR INTERVENTION Left 04/30/2020  ? Procedure: PERIPHERAL VASCULAR INTERVENTION;  Surgeon: Cherre Robins, MD;  Location: [place] [facility];  Service: Cardiovascular;  Laterality: Left;  ? PERIPHERAL VASCULAR THROMBECTOMY N/A 01/23/2020  ? Procedure: lysis recheck;  Surgeon: Marty Heck, MD;  Location: [place] [facility];  Service: Cardiovascular;  Laterality: N/A;  + [place] ?  ? PERIPHERAL VASCULAR THROMBECTOMY N/A 04/30/2020  ? Procedure: Lysis Recheck;  Surgeon: Cherre Robins, MD;  Location: [place] [facility];  Service: Cardiovascular;  Laterality: N/A;  ? SHOULDER ARTHROSCOPY WITH BICEPSTENOTOMY Right 03/11/2015  ? Procedure: SHOULDER ARTHROSCOPY WITH BICEPSTENOTOMY;  Surgeon: Ninetta Lights, MD;  Location: [place];  Service: Orthopedics;  Laterality: Right;  ? SHOULDER ARTHROSCOPY WITH DISTAL CLAVICLE RESECTION Left 11/19/2014  ? Procedure: SHOULDER ARTHROSCOPY WITH DISTAL CLAVICLE RESECTION;  Surgeon: Kathryne Hitch, MD;  Location: [place];  Service: Orthopedics;  Laterality: Left;  ? SHOULDER ARTHROSCOPY WITH ROTATOR CUFF REPAIR AND SUBACROMIAL DECOMPRESSION Left 11/19/2014  ? Procedure: LEFT SHOULDER ARTHROSCOPY, DEBRIDEMENT DISTAL CLAVICLE EXCISION, ACROMIOPLASTY WITH ROTATOR CUFF REPAIR ;  Surgeon: Kathryne Hitch, MD;  Location: [place];  Service: Orthopedics;  Laterality: Left;  ? THROMBECTOMY OF BYPASS GRAFT FEMORAL- POPLITEAL ARTERY Left 05/01/2020  ? Procedure: THROMBECTOMY OF LOWER EXTREMITY;  Surgeon: Waynetta Sandy, MD;  Location: [place];  Service: Vascular;  Laterality: Left;  ? TRANSFORAMINAL LUMBAR INTERBODY FUSION (TLIF) WITH PEDICLE SCREW FIXATION 1 LEVEL N/A 01/03/2018  ? Procedure: TRANSFORAMINAL LUMBAR INTERBODY FUSION (TLIF) LUMBAR FIVE-SACRAL ONE;  Surgeon: Melina Schools, MD;  Location: [place];  Service: Orthopedics;  Laterality: N/A;  ? ULTRASOUND GUIDANCE FOR VASCULAR  ACCESS Right 01/22/2020  ? Procedure: ULTRASOUND GUIDANCE FOR VASCULAR ACCESS Right Common Femoral Artery.;  Surgeon: Marty Heck, MD;  Location: [place];  Service: Vascular;  Laterality: Right;  ? VIDEO ASSISTED THORACOSCOPY (VATS)/DECORTICATION Right 11/21/2011  ? drainage of empyema  ? ?Patient Active Problem List  ? Diagnosis Date Noted  ? Arm pain 07/25/2021  ? Paronychia of great toe 01/28/2021  ? Onychomycosis 01/28/2021  ? Critical limb ischemia with history of revascularization of same extremity ([place]) 04/28/2020  ? History of COVID-19 05/10/2019  ? PAD (peripheral artery disease) ([place])   ? History of critical lower limb ischemia 02/19/2019  ? S/P lumbar fusion, Lower back pain 01/03/2018  ? Coronary artery disease involving native coronary artery of native heart with unstable angina pectoris ([place])   ? Preventative health care 03/13/2017  ? Transaminitis 12/16/2015  ? Bilateral shoulder pain 08/20/2012  ? Tobacco use disorder 08/20/2012  ? HTN (hypertension) 04/20/2011  ? HLD (hyperlipidemia) 10/08/2009  ? Rheumatoid arthritis ([place]) 08/11/2009  ? ? ?REFERRING DIAG: left AKA ? ?THERAPY DIAG:  ?Other abnormalities of gait and mobility ? ?PERTINENT HISTORY: RA, DM2, HTN, PAD, DDD,  s/p multiple L LE revascularizations discharged from hospital a 05/05/20.  At that time he was deemed to not be a candidate for further revasc procedures.  Dc'd home with a known bypass occlusion with a plan for AKA if worsening pain. Returned to ED 05/08/20  for increased pain and seeking amputation. s/p L AKA 05/11/20  ? ?PRECAUTIONS: fall ? ?SUBJECTIVE: Pt got bit again by the same pitbull next door. He wrapped up his left hand but has not seen anyone about it. Thinks it needs stitches and feels like it busted back open coming in. ? ?PAIN:  ?Are you having pain? Yes ?NPRS scale:  ?Pain location: left hand ?Pain orientation:  ?PAIN TYPE: acute ?Pain description: throbbing ?Aggravating factors: dog bite ?Relieving  factors: ? ? ? ?TODAY'S TREATMENT: ?  09/16/21: ?  PT assessed pt's left hand. Pt has gash to left palmar surface of hand that is about an inch long and open with fatty tissue underneath observable. Redness noted arou

## 2021-09-19 ENCOUNTER — Encounter (HOSPITAL_COMMUNITY): Payer: Self-pay | Admitting: Emergency Medicine

## 2021-09-19 ENCOUNTER — Other Ambulatory Visit: Payer: Self-pay

## 2021-09-19 ENCOUNTER — Emergency Department (HOSPITAL_COMMUNITY)
Admission: EM | Admit: 2021-09-19 | Discharge: 2021-09-19 | Disposition: A | Discharge status: Home / Self Care | Payer: Medicare Other | Attending: Emergency Medicine | Admitting: Emergency Medicine

## 2021-09-19 DIAGNOSIS — S61452A Open bite of left hand, initial encounter: Secondary | ICD-10-CM | POA: Insufficient documentation

## 2021-09-19 DIAGNOSIS — Z23 Encounter for immunization: Secondary | ICD-10-CM | POA: Insufficient documentation

## 2021-09-19 DIAGNOSIS — W540XXA Bitten by dog, initial encounter: Secondary | ICD-10-CM | POA: Diagnosis not present

## 2021-09-19 DIAGNOSIS — S60922A Unspecified superficial injury of left hand, initial encounter: Secondary | ICD-10-CM | POA: Diagnosis present

## 2021-09-19 MED ORDER — TETANUS-DIPHTH-ACELL PERTUSSIS 5-2.5-18.5 LF-MCG/0.5 IM SUSY
0.5000 mL | PREFILLED_SYRINGE | Freq: Once | INTRAMUSCULAR | Status: AC
  Administered 2021-09-19: 0.5 mL via INTRAMUSCULAR
  Filled 2021-09-19: qty 0.5

## 2021-09-19 NOTE — Telephone Encounter (Signed)
Dosage of 20 mg was discontinued. ?

## 2021-09-19 NOTE — Discharge Instructions (Addendum)
As we discussed, delayed repair of noninfected wounds is not something routinely done in the emergency department.  I recommend that you keep the wound clean and covered with some antibiotic ointment such as Polysporin.  I have attached the contact information for hand surgeon that is in her network Dr. Tempie Donning, I would contact his office soon as you leave to discuss delayed repair of this laceration. ?

## 2021-09-19 NOTE — ED Provider Notes (Signed)
?[place] ?Provider Note ? ? ?CSN: [id number] ?Arrival date & time: 09/19/21  1049 ? ?  ? ?History ? ?Chief Complaint  ?Patient presents with  ? Animal Bite  ? ? ?[name] [name] is a 61 y.o. male with noncontributory past medical history who presents with concern for dog bite on left hand that occurred 2 weeks ago.  Patient reports the dog was his neighbors, he is up-to-date on his vaccines.  Patient does not know when his last tetanus shot was.  Patient denies any increasing pain, purulent drainage, inability to move the hand, but reports that it is not healing appropriately and maybe could have used some stitches.  He denies any fever, chills.  He reports that it is making his physical therapy difficult due to this hand being where he grips his cane. ? ? ?Animal Bite ? ?  ? ?Home Medications ?Prior to Admission medications   ?Medication Sig Start Date End Date Taking? Authorizing Provider  ?amLODipine (NORVASC) 10 MG tablet TAKE 1 TABLET BY MOUTH EVERY DAY 05/23/21   Jose Persia, MD  ?atorvastatin (LIPITOR) 80 MG tablet Take 1 tablet (80 mg total) by mouth daily at 6 PM. 11/06/19   Oda Kilts, MD  ?chlorthalidone (HYGROTON) 25 MG tablet TAKE 1 TABLET BY MOUTH EVERY DAY 03/31/21   Mitzi Hansen, MD  ?clopidogrel (PLAVIX) 75 MG tablet Take 1 tablet (75 mg total) by mouth daily. 07/18/21 07/18/22  Delene Ruffini, MD  ?docusate sodium (COLACE) 100 MG capsule Take 1 capsule (100 mg total) by mouth daily. 01/27/21   Madalyn Rob, MD  ?doxycycline (VIBRAMYCIN) 100 MG capsule Take 1 capsule (100 mg total) by mouth 2 (two) times daily. 01/27/21   Madalyn Rob, MD  ?gabapentin (NEURONTIN) 100 MG capsule Take 100 mg by mouth at bedtime as needed (for pain).  12/31/19   [provider]  ?Glycerin-Polysorbate 80 (REFRESH DRY EYE THERAPY OP) Place 1 drop into both eyes 3 (three) times daily as needed (for dryness or irritation).     [provider]  ?ibuprofen  (ADVIL) 200 MG tablet Take 200-600 mg by mouth every 6 (six) hours as needed for headache or mild pain.    [provider]  ?naloxone Karma Greaser) 2 MG/2ML injection Inject 2 mg into the muscle once as needed (opiod overdose (call 911, inject intramuscularly in shoulder or thigh. Repeat every 3 minutes)).     [provider]  ?predniSONE (DELTASONE) 10 MG tablet TAKE 1 TABLET BY MOUTH EVERY DAY 03/22/21   Darrick Meigs, Rylee, MD  ?varenicline (CHANTIX PAK) 0.5 MG X 11 & 1 MG X 42 tablet TAKE ONE 0.5 MG TABLET BY MOUTH ONCE DAILY FOR 3 DAYS, THEN INCREASE TO ONE 0.5 MG TABLET TWICE DAILY FOR 4 DAYS, THEN INCREASE TO ONE 1 MG TABLET TWICE DAILY. 04/05/21   Virl Axe, MD  ?varenicline (CHANTIX) 1 MG tablet TAKE 1 TABLET BY MOUTH TWICE A DAY 04/05/21   Virl Axe, MD  ?   ? ?Allergies    ?Dilaudid [hydromorphone hcl], Ramipril, and Tramadol   ? ?Review of Systems   ?Review of Systems  ?Skin:  Positive for wound.  ?All other systems reviewed and are negative. ? ?Physical Exam ?Updated Vital Signs ?BP (!) 130/92   Pulse 96   Temp 98.1 ?F (36.7 ?C) (Oral)   Resp 19   SpO2 95%  ?Physical Exam ?Vitals and nursing note reviewed.  ?Constitutional:   ?   General: He is  not in acute distress. ?   Appearance: Normal appearance.  ?HENT:  ?   Head: Normocephalic and atraumatic.  ?Eyes:  ?   General:     ?   Right eye: No discharge.     ?   Left eye: No discharge.  ?Cardiovascular:  ?   Rate and Rhythm: Normal rate and regular rhythm.  ?   Pulses: Normal pulses.  ?Pulmonary:  ?   Effort: Pulmonary effort is normal. No respiratory distress.  ?Musculoskeletal:     ?   General: No deformity.  ?   Comments: Intact strength 5 out of 5 to flexion extension of all fingers of the left hand, as well as the wrist of the left hand.  ?Skin: ?   General: Skin is warm and dry.  ?   Capillary Refill: Capillary refill takes less than 2 seconds.  ?   Comments: Patient with approximately 2-1/2 cm poorly approximated, dehisced  wound on the dorsum of the left hand overlying the hypothenar eminence.  There is no evidence of purulent drainage, tracking redness, foul smell.  It appears to be healing overall appropriately other than dehiscence.  ?Neurological:  ?   Mental Status: He is alert and oriented to person, place, and time.  ?Psychiatric:     ?   Mood and Affect: Mood normal.     ?   Behavior: Behavior normal.  ? ? ?ED Results / Procedures / Treatments   ?Labs ?(all labs ordered are listed, but only abnormal results are displayed) ?Labs Reviewed - No data to display ? ?EKG ?None ? ?Radiology ?No results found. ? ?Procedures ?Procedures  ? ? ?Medications Ordered in ED ?Medications  ?Tdap (BOOSTRIX) injection 0.5 mL (0.5 mLs Intramuscular Given 09/19/21 1145)  ? ? ?ED Course/ Medical Decision Making/ A&P ?  ?                        ?Medical Decision Making ?Risk ?Prescription drug management. ? ? ?I discussed this case with my attending physician who cosigned this note including patient's presenting symptoms, physical exam, and planned diagnostics and interventions. Attending physician stated agreement with plan or made changes to plan which were implemented.  ? ?Is a 61 year old male with noncontributory past medical history presents with concern for dog bite sustained 2 weeks ago.  He did not seek any evaluation.  He does know the dog and knows that it is up-to-date on vaccines.  No clinical concern for rabies at this time.  Emergent differential diagnosis includes flexor tenosynovitis, cellulitis, or other skin / soft tissue infection secondary to dog bite.  Additionally discussed concern for possible occult fracture or other injury.  Patient with intact range of motion, intact flexion, extension, and no significant point tenderness over bony prominences of the left hand. ? ?Discussed with patient that with no evidence of overlying infection, no evidence of fracture, intact range of motion of all the fingers there is not any further  work-up to be done in the emergency department.  Discussed that I agree that there is likely some need for secondary repair due to the dehiscence.  Patient referred to Dr. Tempie Donning with hand surgery.  Encouraged to keep the wound clean and covered until he is able to follow-up with the surgeon.  Patient understands and agrees to plan, discharged in stable condition at this time. ?Final Clinical Impression(s) / ED Diagnoses ?Final diagnoses:  ?Dog bite of left hand, initial encounter  ? ? ?  Rx / DC Orders ?ED Discharge Orders   ? ? None  ? ?  ? ? ?  ?Anselmo Pickler, PA-C ?09/19/21 1155 ? ?  ?Daleen Bo, MD ?09/19/21 1802 ? ?

## 2021-09-19 NOTE — ED Triage Notes (Signed)
Patient coming from home, complaint of dog bite on left hand that occurred 2 weeks ago, states he just wrapped it up and kept it clean, now wants it evaluated. ?

## 2021-09-20 ENCOUNTER — Ambulatory Visit: Payer: Medicare Other

## 2021-09-23 ENCOUNTER — Ambulatory Visit: Payer: Medicare Other

## 2021-09-27 ENCOUNTER — Other Ambulatory Visit: Payer: Self-pay | Admitting: Internal Medicine

## 2021-09-27 ENCOUNTER — Ambulatory Visit (INDEPENDENT_AMBULATORY_CARE_PROVIDER_SITE_OTHER): Payer: Medicare Other | Admitting: Orthopedic Surgery

## 2021-09-27 ENCOUNTER — Encounter: Payer: Medicare Other | Admitting: Physical Therapy

## 2021-09-27 DIAGNOSIS — S61452A Open bite of left hand, initial encounter: Secondary | ICD-10-CM | POA: Insufficient documentation

## 2021-09-27 DIAGNOSIS — I10 Essential (primary) hypertension: Secondary | ICD-10-CM

## 2021-09-27 HISTORY — DX: Open bite of left hand, initial encounter: S61.452A

## 2021-09-27 NOTE — Progress Notes (Signed)
? ?Office Visit Note ?  ?Patient: [name] [name]           ?Date of Birth: 1961/02/20           ?MRN: [id number] ?Visit Date: 09/27/2021 ?             ?Requested by: Scarlett Presto, MD ?[address] ?[place],  [place] [address] ?PCP: Scarlett Presto, MD ? ? ?Assessment & Plan: ?Visit Diagnoses:  ?1. Bite wound of left hand, initial encounter   ? ? ?Plan: Discussed with patient that wound does not appear to be infected.  There is no drainage.  There is mild erythema at the hypothenar eminence but patient notes that this is from putting pressure on his wheelchair.  He has minimal pain in the area.  His biggest issue seems to be that the wound opens when putting pressure on the hand using a walker.  The wound was dressed with bacitracin ointment, 4 x 4's, and an Ace wrap.  Patient was shown how to wrap hand to provide some padding to the area.  Discussed keeping the wound clean with warm soapy water and daily dressing changes.  I can see him back as needed if this laceration fails to heal after several more weeks. ? ?Follow-Up Instructions: No follow-ups on file.  ? ?Orders:  ?No orders of the defined types were placed in this encounter. ? ?No orders of the defined types were placed in this encounter. ? ? ? ? Procedures: ?No procedures performed ? ? ?Clinical Data: ?No additional findings. ? ? ?Subjective: ?Chief Complaint  ?Patient presents with  ? Left Hand - Injury  ? ? ?This is a 61 year old left-hand-dominant male who presents for ER follow-up of a dog bite to the palmar aspect of the left hand at the hypothenar eminence.  This happened about 2-2 and half weeks ago.  Since that time he has a small 1 1/2 cm laceration at the ulnar side of the palm.  The wound is largely well approximated with 1 or 2 mm of gapping of the skin edges.  There is no surrounding erythema.  There is no drainage.  He has no pain in this area.  He denies any numbness or paresthesias in the area. ? ?Injury ? ? ?Review of  Systems ? ? ?Objective: ?Vital Signs: There were no vitals taken for this visit. ? ?Physical Exam ?Constitutional:   ?   Appearance: Normal appearance.  ?Cardiovascular:  ?   Rate and Rhythm: Normal rate.  ?   Pulses: Normal pulses.  ?Pulmonary:  ?   Effort: Pulmonary effort is normal.  ?Skin: ?   General: Skin is warm and dry.  ?   Capillary Refill: Capillary refill takes less than 2 seconds.  ?Neurological:  ?   Mental Status: He is alert.  ? ? ?Left Hand Exam  ? ?Tenderness  ?The patient is experiencing no tenderness.  ? ?Other  ?Erythema: absent ?Sensation: normal ?Pulse: present ? ?Comments:  Approx 1.5 cm wound at ulnar aspect of palm proximal to [facility].  No surrounding erythema.  No drainage.  Wound largely well approximated with 1-2 mm gap.  ? ? ? ? ?Specialty Comments:  ?No specialty comments available. ? ?Imaging: ?No results found. ? ? ?PMFS History: ?Patient Active Problem List  ? Diagnosis Date Noted  ? Bite wound of left hand 09/27/2021  ? Arm pain 07/25/2021  ? Paronychia of great toe 01/28/2021  ? Onychomycosis 01/28/2021  ? Critical limb ischemia with  history of revascularization of same extremity ([place]) 04/28/2020  ? History of COVID-19 05/10/2019  ? PAD (peripheral artery disease) ([place])   ? History of critical lower limb ischemia 02/19/2019  ? S/P lumbar fusion, Lower back pain 01/03/2018  ? Coronary artery disease involving native coronary artery of native heart with unstable angina pectoris ([place])   ? Preventative health care 03/13/2017  ? Transaminitis 12/16/2015  ? Bilateral shoulder pain 08/20/2012  ? Tobacco use disorder 08/20/2012  ? HTN (hypertension) 04/20/2011  ? HLD (hyperlipidemia) 10/08/2009  ? Rheumatoid arthritis ([place]) 08/11/2009  ? ?Past Medical History:  ?Diagnosis Date  ? Anemia   ? Anemia 08/06/2012  ? Aortic valve mass 12/30/2015  ? Community acquired pneumonia 11/07/2011  ? DDD (degenerative disc disease), lumbosacral   ?  and grade 2 slip  ? GERD (gastroesophageal reflux disease)   ?  Heart murmur   ? History of anemia   ? no current med.  ? Hypertension   ? states is borderline on medication; has been on med. x 5-6 yr.  ? Hypokalemia 05/30/2018  ? Impingement syndrome of shoulder region 10/2014  ? left  ? Insomnia 06/01/2015  ? Ischemic ulcer of toe of left foot ([place])   ? Left shoulder pain 12/16/2015  ? Low back pain without sciatica 10/29/2009  ? Lupus ([place])   ? Peripheral vascular disease ([place])   ? Pneumonia   ? RA (rheumatoid arthritis) ([place])   ? Rotator cuff tear 11/04/2014  ?  ?Family History  ?Problem Relation Age of Onset  ? Heart attack Father 47  ? Alcohol abuse Father   ? Aneurysm Mother   ? Stroke Neg Hx   ? Cancer Neg Hx   ?  ?Past Surgical History:  ?Procedure Laterality Date  ? ABDOMINAL AORTAGRAM  02/20/2019  ? ABDOMINAL AORTOGRAM W/LOWER EXTREMITY N/A 02/20/2019  ? Procedure: ABDOMINAL AORTOGRAM W/LOWER EXTREMITY;  Surgeon: Marty Heck, MD;  Location: [place] [facility];  Service: Cardiovascular;  Laterality: N/A;  ? ACHILLES TENDON SURGERY Bilateral   ? AMPUTATION Left 05/11/2020  ? Procedure: LEFT ABOVE KNEE AMPUTATION;  Surgeon: Angelia Mould, MD;  Location: [place];  Service: Vascular;  Laterality: Left;  ? ENDARTERECTOMY POPLITEAL Left 05/01/2020  ? Procedure: ENDARTERECTOMY POPLITEAL;  Surgeon: Waynetta Sandy, MD;  Location: [place];  Service: Vascular;  Laterality: Left;  ? FASCIOTOMY Left 05/01/2020  ? Procedure: FASCIOTOMY;  Surgeon: Waynetta Sandy, MD;  Location: [place];  Service: Vascular;  Laterality: Left;  ? FEMORAL-POPLITEAL BYPASS GRAFT Left 02/26/2019  ? Procedure: BYPASS GRAFT FEMORAL-POPLITEAL ARTERY LEFT LEG USING 15m PROPATEN GRAFT;  Surgeon: BSerafina Mitchell MD;  [place]  Service: Vascular;  Laterality: Left;  ? INTRAOPERATIVE ARTERIOGRAM Left 01/22/2020  ? Procedure: INTRA OPERATIVE ARTERIOGRAM with administration of thrombolyics in Left femoral to popliteal bypass.;  Surgeon: CMarty Heck MD;  [place]   Service: Vascular;  Laterality: Left;  ? LEFT HEART CATH AND CORONARY ANGIOGRAPHY N/A 03/15/2017  ? Procedure: LEFT HEART CATH AND CORONARY ANGIOGRAPHY;  Surgeon: ENelva Bush MD;  [place] [facility];  Service: Cardiovascular;  Laterality: N/A;  ? LOWER EXTREMITY INTERVENTION Left 04/29/2020  ? Procedure: LOWER EXTREMITY INTERVENTION- LYSIS;  Surgeon: CMarty Heck MD;  [facility];  Service: Cardiovascular;  Laterality: Left;  ? OSTEOTOMY AND ULNAR SHORTENING Right 07/22/2002  ? PATCH ANGIOPLASTY Left 05/01/2020  ? Procedure: [place]  Surgeon: CDonzetta Matters  Georgia Dom, MD;  Location: [place];  Service: Vascular;  Laterality: Left;  ? PERIPHERAL VASCULAR ATHERECTOMY  01/23/2020  ? Procedure: PERIPHERAL VASCULAR ATHERECTOMY;  Surgeon: Marty Heck, MD;  Location: [place] [facility];  Service: Cardiovascular;;  ? PERIPHERAL VASCULAR BALLOON ANGIOPLASTY  01/23/2020  ? Procedure: PERIPHERAL VASCULAR BALLOON ANGIOPLASTY;  Surgeon: Marty Heck, MD;  Location: [place] [facility];  Service: Cardiovascular;;  ? PERIPHERAL VASCULAR INTERVENTION Left 04/30/2020  ? Procedure: PERIPHERAL VASCULAR INTERVENTION;  Surgeon: Cherre Robins, MD;  Location: [place] [facility];  Service: Cardiovascular;  Laterality: Left;  ? PERIPHERAL VASCULAR THROMBECTOMY N/A 01/23/2020  ? Procedure: lysis recheck;  Surgeon: Marty Heck, MD;  Location: [place] [facility];  Service: Cardiovascular;  Laterality: N/A;  + [place] ?  ? PERIPHERAL VASCULAR THROMBECTOMY N/A 04/30/2020  ? Procedure: Lysis Recheck;  Surgeon: Cherre Robins, MD;  Location: [place] [facility];  Service: Cardiovascular;  Laterality: N/A;  ? SHOULDER ARTHROSCOPY WITH BICEPSTENOTOMY Right 03/11/2015  ? Procedure: SHOULDER ARTHROSCOPY WITH BICEPSTENOTOMY;  Surgeon: Ninetta Lights, MD;  Location: [place];  Service: Orthopedics;  Laterality: Right;  ?  SHOULDER ARTHROSCOPY WITH DISTAL CLAVICLE RESECTION Left 11/19/2014  ? Procedure: SHOULDER ARTHROSCOPY WITH DISTAL CLAVICLE RESECTION;  Surgeon: Kathryne Hitch, MD;  Location: [place];  Service: Orthopedics;  Late

## 2021-09-30 ENCOUNTER — Ambulatory Visit: Payer: Medicare Other | Admitting: Physical Therapy

## 2021-10-11 ENCOUNTER — Ambulatory Visit: Payer: Medicare Other | Admitting: Orthopedic Surgery

## 2021-10-11 ENCOUNTER — Ambulatory Visit: Payer: Medicare Other | Attending: Vascular Surgery

## 2021-10-11 DIAGNOSIS — R2689 Other abnormalities of gait and mobility: Secondary | ICD-10-CM | POA: Diagnosis not present

## 2021-10-11 DIAGNOSIS — R2681 Unsteadiness on feet: Secondary | ICD-10-CM | POA: Diagnosis not present

## 2021-10-11 DIAGNOSIS — R293 Abnormal posture: Secondary | ICD-10-CM | POA: Insufficient documentation

## 2021-10-11 DIAGNOSIS — M6281 Muscle weakness (generalized): Secondary | ICD-10-CM | POA: Diagnosis not present

## 2021-10-11 NOTE — Therapy (Addendum)
?OUTPATIENT PHYSICAL THERAPY TREATMENT NOTE ?    Recert ? ?Patient Name: [name] [name] ?MRN: [id number] ?DOB:[birth year], 61 y.o., male ?Today's Date: 10/11/2021 ? ?PCP: Scarlett Presto, MD ?REFERRING PROVIDER: Deitra Mayo, MD ? ? PT End of Session - 10/11/21 1321   ? ? Visit Number 24   ? Number of Visits 35   ? Date for PT Re-Evaluation 12/02/21   ? Authorization Type UHC medicare with medicaid secondary so 10th visit progress note   ? PT Start Time 1319   ? PT Stop Time 8338   ? PT Time Calculation (min) 44 min   ? Equipment Utilized During Treatment Gait belt   ? Activity Tolerance Patient tolerated treatment well;Patient limited by pain   ? Behavior During Therapy [facility] for tasks assessed/performed   ? ?  ?  ? ?  ? ? ?Past Medical History:  ?Diagnosis Date  ? Anemia   ? Anemia 08/06/2012  ? Aortic valve mass 12/30/2015  ? Community acquired pneumonia 11/07/2011  ? DDD (degenerative disc disease), lumbosacral   ?  and grade 2 slip  ? GERD (gastroesophageal reflux disease)   ? Heart murmur   ? History of anemia   ? no current med.  ? Hypertension   ? states is borderline on medication; has been on med. x 5-6 yr.  ? Hypokalemia 05/30/2018  ? Impingement syndrome of shoulder region 10/2014  ? left  ? Insomnia 06/01/2015  ? Ischemic ulcer of toe of left foot ([place])   ? Left shoulder pain 12/16/2015  ? Low back pain without sciatica 10/29/2009  ? Lupus ([place])   ? Peripheral vascular disease ([place])   ? Pneumonia   ? RA (rheumatoid arthritis) ([place])   ? Rotator cuff tear 11/04/2014  ? ?Past Surgical History:  ?Procedure Laterality Date  ? ABDOMINAL AORTAGRAM  02/20/2019  ? ABDOMINAL AORTOGRAM W/LOWER EXTREMITY N/A 02/20/2019  ? Procedure: ABDOMINAL AORTOGRAM W/LOWER EXTREMITY;  Surgeon: Marty Heck, MD;  Location: [place] [facility];  Service: Cardiovascular;  Laterality: N/A;  ? ACHILLES TENDON SURGERY Bilateral   ? AMPUTATION Left 05/11/2020  ? Procedure: LEFT ABOVE KNEE AMPUTATION;  Surgeon: Angelia Mould, MD;   Location: [place];  Service: Vascular;  Laterality: Left;  ? ENDARTERECTOMY POPLITEAL Left 05/01/2020  ? Procedure: ENDARTERECTOMY POPLITEAL;  Surgeon: Waynetta Sandy, MD;  Location: [place];  Service: Vascular;  Laterality: Left;  ? FASCIOTOMY Left 05/01/2020  ? Procedure: FASCIOTOMY;  Surgeon: Waynetta Sandy, MD;  Location: [place];  Service: Vascular;  Laterality: Left;  ? FEMORAL-POPLITEAL BYPASS GRAFT Left 02/26/2019  ? Procedure: BYPASS GRAFT FEMORAL-POPLITEAL ARTERY LEFT LEG USING 55m PROPATEN GRAFT;  Surgeon: BSerafina Mitchell MD;  [place]  Service: Vascular;  Laterality: Left;  ? INTRAOPERATIVE ARTERIOGRAM Left 01/22/2020  ? Procedure: INTRA OPERATIVE ARTERIOGRAM with administration of thrombolyics in Left femoral to popliteal bypass.;  Surgeon: CMarty Heck MD;  [place]  Service: Vascular;  Laterality: Left;  ? LEFT HEART CATH AND CORONARY ANGIOGRAPHY N/A 03/15/2017  ? Procedure: LEFT HEART CATH AND CORONARY ANGIOGRAPHY;  Surgeon: ENelva Bush MD;  [place];  Service: Cardiovascular;  Laterality: N/A;  ? LOWER EXTREMITY INTERVENTION Left 04/29/2020  ? Procedure: LOWER EXTREMITY INTERVENTION- LYSIS;  Surgeon: CMarty Heck MD;  [place];  Service: Cardiovascular;  Laterality: Left;  ? OSTEOTOMY AND ULNAR SHORTENING Right 07/22/2002  ? PATCH ANGIOPLASTY Left 05/01/2020  ? Procedure: PATCH ANGIOPLASTY USING  [place];  Surgeon: Waynetta Sandy, MD;  Location: [place];  Service: Vascular;  Laterality: Left;  ? PERIPHERAL VASCULAR ATHERECTOMY  01/23/2020  ? Procedure: PERIPHERAL VASCULAR ATHERECTOMY;  Surgeon: Marty Heck, MD;  Location: [place] [facility] LAB;  Service: Cardiovascular;;  ? PERIPHERAL VASCULAR BALLOON ANGIOPLASTY  01/23/2020  ? Procedure: PERIPHERAL VASCULAR BALLOON ANGIOPLASTY;  Surgeon: Marty Heck, MD;  Location: [place] [facility];  Service: Cardiovascular;;  ?  PERIPHERAL VASCULAR INTERVENTION Left 04/30/2020  ? Procedure: PERIPHERAL VASCULAR INTERVENTION;  Surgeon: Cherre Robins, MD;  Location: [place] CV LAB;  Service: Cardiovascular;  Laterality: Left;  ? PERIPHERAL VASCULAR THROMBECTOMY N/A 01/23/2020  ? Procedure: lysis recheck;  Surgeon: Marty Heck, MD;  Location: [place] [facility];  Service: Cardiovascular;  Laterality: N/A;  + [place] ?  ? PERIPHERAL VASCULAR THROMBECTOMY N/A 04/30/2020  ? Procedure: Lysis Recheck;  Surgeon: Cherre Robins, MD;  Location: [place] [facility];  Service: Cardiovascular;  Laterality: N/A;  ? SHOULDER ARTHROSCOPY WITH BICEPSTENOTOMY Right 03/11/2015  ? Procedure: SHOULDER ARTHROSCOPY WITH BICEPSTENOTOMY;  Surgeon: Ninetta Lights, MD;  Location: [place];  Service: Orthopedics;  Laterality: Right;  ? SHOULDER ARTHROSCOPY WITH DISTAL CLAVICLE RESECTION Left 11/19/2014  ? Procedure: SHOULDER ARTHROSCOPY WITH DISTAL CLAVICLE RESECTION;  Surgeon: Kathryne Hitch, MD;  Location: [place];  Service: Orthopedics;  Laterality: Left;  ? SHOULDER ARTHROSCOPY WITH ROTATOR CUFF REPAIR AND SUBACROMIAL DECOMPRESSION Left 11/19/2014  ? Procedure: LEFT SHOULDER ARTHROSCOPY, DEBRIDEMENT DISTAL CLAVICLE EXCISION, ACROMIOPLASTY WITH ROTATOR CUFF REPAIR ;  Surgeon: Kathryne Hitch, MD;  Location: [place];  Service: Orthopedics;  Laterality: Left;  ? THROMBECTOMY OF BYPASS GRAFT FEMORAL- POPLITEAL ARTERY Left 05/01/2020  ? Procedure: THROMBECTOMY OF LOWER EXTREMITY;  Surgeon: Waynetta Sandy, MD;  Location: [place];  Service: Vascular;  Laterality: Left;  ? TRANSFORAMINAL LUMBAR INTERBODY FUSION (TLIF) WITH PEDICLE SCREW FIXATION 1 LEVEL N/A 01/03/2018  ? Procedure: TRANSFORAMINAL LUMBAR INTERBODY FUSION (TLIF) LUMBAR FIVE-SACRAL ONE;  Surgeon: Melina Schools, MD;  Location: [place];  Service: Orthopedics;  Laterality: N/A;  ? ULTRASOUND GUIDANCE FOR VASCULAR ACCESS Right 01/22/2020  ?  Procedure: ULTRASOUND GUIDANCE FOR VASCULAR ACCESS Right Common Femoral Artery.;  Surgeon: Marty Heck, MD;  Location: [place];  Service: Vascular;  Laterality: Right;  ? VIDEO ASSISTED THORACOSCOPY (VATS)/DECORTICATION Right 11/21/2011  ? drainage of empyema  ? ?Patient Active Problem List  ? Diagnosis Date Noted  ? Bite wound of left hand 09/27/2021  ? Arm pain 07/25/2021  ? Paronychia of great toe 01/28/2021  ? Onychomycosis 01/28/2021  ? Critical limb ischemia with history of revascularization of same extremity ([place]) 04/28/2020  ? History of COVID-19 05/10/2019  ? PAD (peripheral artery disease) ([place])   ? History of critical lower limb ischemia 02/19/2019  ? S/P lumbar fusion, Lower back pain 01/03/2018  ? Coronary artery disease involving native coronary artery of native heart with unstable angina pectoris ([place])   ? Preventative health care 03/13/2017  ? Transaminitis 12/16/2015  ? Bilateral shoulder pain 08/20/2012  ? Tobacco use disorder 08/20/2012  ? HTN (hypertension) 04/20/2011  ? HLD (hyperlipidemia) 10/08/2009  ? Rheumatoid arthritis ([place]) 08/11/2009  ? ? ?REFERRING DIAG: left AKA ? ?THERAPY DIAG:  ?Other abnormalities of gait and mobility ? ?Muscle weakness (generalized) ? ?Unsteadiness on feet ? ?Abnormal posture ? ?PERTINENT HISTORY: RA, DM2, HTN, PAD, DDD,  s/p multiple L LE revascularizations discharged from hospital a 05/05/20.  At that time he was deemed to not be a candidate for further revasc procedures.  Dc'd home with a known bypass occlusion with a plan for AKA if worsening pain. Returned to ED 05/08/20  for increased pain and seeking amputation. s/p L AKA 05/11/20  ? ?PRECAUTIONS: fall ? ?SUBJECTIVE: Pt  reports left hand is all healed. He did see a doctor about it as well. Feels really stiff as has not been up much as had to let hand rest to heal. ? ?PAIN:  ?Are you having pain? Yes ?NPRS scale:5/10 ?Pain location: shoulders and hips ?Pain orientation:  ?PAIN TYPE: chronic ?Pain  description: sore ?Aggravating factors: arthritis and not moving much or moving too much. ?Relieving factors: ? ? ? ?TODAY'S TREATMENT: ?  10/11/21: ?  Prosthetics: ?Prosthetic care comments: Pt reports he has no

## 2021-10-12 DIAGNOSIS — M545 Low back pain, unspecified: Secondary | ICD-10-CM | POA: Diagnosis not present

## 2021-10-12 DIAGNOSIS — M5136 Other intervertebral disc degeneration, lumbar region: Secondary | ICD-10-CM | POA: Diagnosis not present

## 2021-10-12 DIAGNOSIS — G8929 Other chronic pain: Secondary | ICD-10-CM | POA: Diagnosis not present

## 2021-10-12 DIAGNOSIS — G894 Chronic pain syndrome: Secondary | ICD-10-CM | POA: Diagnosis not present

## 2021-10-14 ENCOUNTER — Ambulatory Visit: Payer: Medicare Other

## 2021-10-14 DIAGNOSIS — R2689 Other abnormalities of gait and mobility: Secondary | ICD-10-CM | POA: Diagnosis not present

## 2021-10-14 DIAGNOSIS — R2681 Unsteadiness on feet: Secondary | ICD-10-CM | POA: Diagnosis not present

## 2021-10-14 DIAGNOSIS — R293 Abnormal posture: Secondary | ICD-10-CM | POA: Diagnosis not present

## 2021-10-14 DIAGNOSIS — M6281 Muscle weakness (generalized): Secondary | ICD-10-CM

## 2021-10-14 NOTE — Therapy (Signed)
?OUTPATIENT PHYSICAL THERAPY TREATMENT NOTE ?    Recert ? ?Patient Name: [name] [name] ?MRN: [id number] ?DOB:[birth year], 61 y.o., male ?Today's Date: 10/14/2021 ? ?PCP: Scarlett Presto, MD ?REFERRING PROVIDER: Deitra Mayo, MD ? ? PT End of Session - 10/14/21 1405   ? ? Visit Number 25   ? Number of Visits 35   ? Date for PT Re-Evaluation 12/02/21   ? Authorization Type UHC medicare with medicaid secondary so 10th visit progress note   ? PT Start Time 1400   ? PT Stop Time 1441   ? PT Time Calculation (min) 41 min   ? Equipment Utilized During Treatment Gait belt   ? Activity Tolerance Patient tolerated treatment well;Patient limited by pain   ? Behavior During Therapy [facility] for tasks assessed/performed   ? ?  ?  ? ?  ? ? ?Past Medical History:  ?Diagnosis Date  ? Anemia   ? Anemia 08/06/2012  ? Aortic valve mass 12/30/2015  ? Community acquired pneumonia 11/07/2011  ? DDD (degenerative disc disease), lumbosacral   ?  and grade 2 slip  ? GERD (gastroesophageal reflux disease)   ? Heart murmur   ? History of anemia   ? no current med.  ? Hypertension   ? states is borderline on medication; has been on med. x 5-6 yr.  ? Hypokalemia 05/30/2018  ? Impingement syndrome of shoulder region 10/2014  ? left  ? Insomnia 06/01/2015  ? Ischemic ulcer of toe of left foot ([place])   ? Left shoulder pain 12/16/2015  ? Low back pain without sciatica 10/29/2009  ? Lupus ([place])   ? Peripheral vascular disease ([place])   ? Pneumonia   ? RA (rheumatoid arthritis) ([place])   ? Rotator cuff tear 11/04/2014  ? ?Past Surgical History:  ?Procedure Laterality Date  ? ABDOMINAL AORTAGRAM  02/20/2019  ? ABDOMINAL AORTOGRAM W/LOWER EXTREMITY N/A 02/20/2019  ? Procedure: ABDOMINAL AORTOGRAM W/LOWER EXTREMITY;  Surgeon: Marty Heck, MD;  Location: [place] [facility];  Service: Cardiovascular;  Laterality: N/A;  ? ACHILLES TENDON SURGERY Bilateral   ? AMPUTATION Left 05/11/2020  ? Procedure: LEFT ABOVE KNEE AMPUTATION;  Surgeon: Angelia Mould, MD;   Location: [place];  Service: Vascular;  Laterality: Left;  ? ENDARTERECTOMY POPLITEAL Left 05/01/2020  ? Procedure: ENDARTERECTOMY POPLITEAL;  Surgeon: Waynetta Sandy, MD;  Location: [place];  Service: Vascular;  Laterality: Left;  ? FASCIOTOMY Left 05/01/2020  ? Procedure: FASCIOTOMY;  Surgeon: Waynetta Sandy, MD;  Location: [place];  Service: Vascular;  Laterality: Left;  ? FEMORAL-POPLITEAL BYPASS GRAFT Left 02/26/2019  ? Procedure: BYPASS GRAFT FEMORAL-POPLITEAL ARTERY LEFT LEG USING 26m PROPATEN GRAFT;  Surgeon: BSerafina Mitchell MD;  [place]  Service: Vascular;  Laterality: Left;  ? INTRAOPERATIVE ARTERIOGRAM Left 01/22/2020  ? Procedure: INTRA OPERATIVE ARTERIOGRAM with administration of thrombolyics in Left femoral to popliteal bypass.;  Surgeon: CMarty Heck MD;  [place]  Service: Vascular;  Laterality: Left;  ? LEFT HEART CATH AND CORONARY ANGIOGRAPHY N/A 03/15/2017  ? Procedure: LEFT HEART CATH AND CORONARY ANGIOGRAPHY;  Surgeon: ENelva Bush MD;  [place];  Service: Cardiovascular;  Laterality: N/A;  ? LOWER EXTREMITY INTERVENTION Left 04/29/2020  ? Procedure: LOWER EXTREMITY INTERVENTION- LYSIS;  Surgeon: CMarty Heck MD;  [place];  Service: Cardiovascular;  Laterality: Left;  ? OSTEOTOMY AND ULNAR SHORTENING Right 07/22/2002  ? PATCH ANGIOPLASTY Left 05/01/2020  ? Procedure: PATCH ANGIOPLASTY USING  [place];  Surgeon: Waynetta Sandy, MD;  Location: [place];  Service: Vascular;  Laterality: Left;  ? PERIPHERAL VASCULAR ATHERECTOMY  01/23/2020  ? Procedure: PERIPHERAL VASCULAR ATHERECTOMY;  Surgeon: Marty Heck, MD;  Location: [place] [facility];  Service: Cardiovascular;;  ? PERIPHERAL VASCULAR BALLOON ANGIOPLASTY  01/23/2020  ? Procedure: PERIPHERAL VASCULAR BALLOON ANGIOPLASTY;  Surgeon: Marty Heck, MD;  Location: [place] [facility];  Service: Cardiovascular;;  ?  PERIPHERAL VASCULAR INTERVENTION Left 04/30/2020  ? Procedure: PERIPHERAL VASCULAR INTERVENTION;  Surgeon: Cherre Robins, MD;  Location: [place] [facility];  Service: Cardiovascular;  Laterality: Left;  ? PERIPHERAL VASCULAR THROMBECTOMY N/A 01/23/2020  ? Procedure: lysis recheck;  Surgeon: Marty Heck, MD;  Location: [place] [facility];  Service: Cardiovascular;  Laterality: N/A;  + [place] ?  ? PERIPHERAL VASCULAR THROMBECTOMY N/A 04/30/2020  ? Procedure: Lysis Recheck;  Surgeon: Cherre Robins, MD;  Location: [place] [facility];  Service: Cardiovascular;  Laterality: N/A;  ? SHOULDER ARTHROSCOPY WITH BICEPSTENOTOMY Right 03/11/2015  ? Procedure: SHOULDER ARTHROSCOPY WITH BICEPSTENOTOMY;  Surgeon: Ninetta Lights, MD;  Location: [place];  Service: Orthopedics;  Laterality: Right;  ? SHOULDER ARTHROSCOPY WITH DISTAL CLAVICLE RESECTION Left 11/19/2014  ? Procedure: SHOULDER ARTHROSCOPY WITH DISTAL CLAVICLE RESECTION;  Surgeon: Kathryne Hitch, MD;  Location: [place];  Service: Orthopedics;  Laterality: Left;  ? SHOULDER ARTHROSCOPY WITH ROTATOR CUFF REPAIR AND SUBACROMIAL DECOMPRESSION Left 11/19/2014  ? Procedure: LEFT SHOULDER ARTHROSCOPY, DEBRIDEMENT DISTAL CLAVICLE EXCISION, ACROMIOPLASTY WITH ROTATOR CUFF REPAIR ;  Surgeon: Kathryne Hitch, MD;  Location: [place];  Service: Orthopedics;  Laterality: Left;  ? THROMBECTOMY OF BYPASS GRAFT FEMORAL- POPLITEAL ARTERY Left 05/01/2020  ? Procedure: THROMBECTOMY OF LOWER EXTREMITY;  Surgeon: Waynetta Sandy, MD;  Location: [place];  Service: Vascular;  Laterality: Left;  ? TRANSFORAMINAL LUMBAR INTERBODY FUSION (TLIF) WITH PEDICLE SCREW FIXATION 1 LEVEL N/A 01/03/2018  ? Procedure: TRANSFORAMINAL LUMBAR INTERBODY FUSION (TLIF) LUMBAR FIVE-SACRAL ONE;  Surgeon: Melina Schools, MD;  Location: [place];  Service: Orthopedics;  Laterality: N/A;  ? ULTRASOUND GUIDANCE FOR VASCULAR ACCESS Right 01/22/2020  ?  Procedure: ULTRASOUND GUIDANCE FOR VASCULAR ACCESS Right Common Femoral Artery.;  Surgeon: Marty Heck, MD;  Location: [place];  Service: Vascular;  Laterality: Right;  ? VIDEO ASSISTED THORACOSCOPY (VATS)/DECORTICATION Right 11/21/2011  ? drainage of empyema  ? ?Patient Active Problem List  ? Diagnosis Date Noted  ? Bite wound of left hand 09/27/2021  ? Arm pain 07/25/2021  ? Paronychia of great toe 01/28/2021  ? Onychomycosis 01/28/2021  ? Critical limb ischemia with history of revascularization of same extremity ([place]) 04/28/2020  ? History of COVID-19 05/10/2019  ? PAD (peripheral artery disease) ([place])   ? History of critical lower limb ischemia 02/19/2019  ? S/P lumbar fusion, Lower back pain 01/03/2018  ? Coronary artery disease involving native coronary artery of native heart with unstable angina pectoris ([place])   ? Preventative health care 03/13/2017  ? Transaminitis 12/16/2015  ? Bilateral shoulder pain 08/20/2012  ? Tobacco use disorder 08/20/2012  ? HTN (hypertension) 04/20/2011  ? HLD (hyperlipidemia) 10/08/2009  ? Rheumatoid arthritis ([place]) 08/11/2009  ? ? ?REFERRING DIAG: left AKA ? ?THERAPY DIAG:  ?Other abnormalities of gait and mobility ? ?Muscle weakness (generalized) ? ?Unsteadiness on feet ? ?Abnormal posture ? ?PERTINENT HISTORY: RA, DM2, HTN, PAD, DDD,  s/p multiple L LE revascularizations discharged from hospital a 05/05/20.  At that time he was deemed to not be a candidate for further revasc procedures.  Dc'd home with a known bypass occlusion with a plan for AKA if worsening pain. Returned to ED 05/08/20  for increased pain and seeking amputation. s/p L AKA 05/11/20  ? ?PRECAUTIONS: fall ? ?SUBJECTIVE: Pt  reports that he just had a fall at home prior to coming as his dog bumped his right knee and he fell back. Low back is sore. Was able to get up with a little assist from his friend. ? ?PAIN:  ?Are you having pain? Yes ?NPRS scale:3-4/10 ?Pain location: low back and shoulders ?Pain  orientation:  ?PAIN TYPE: acute and chronic ?Pain description: sore ?Aggravating factors: arthritis and fall this morning.  ?Relieving factors: ? ? ? ?TODAY'S TREATMENT: ?  10/14/21 ?  Prosthetics: ?Prosthetic c

## 2021-10-18 ENCOUNTER — Ambulatory Visit: Payer: Medicare Other

## 2021-10-18 DIAGNOSIS — R293 Abnormal posture: Secondary | ICD-10-CM | POA: Diagnosis not present

## 2021-10-18 DIAGNOSIS — M6281 Muscle weakness (generalized): Secondary | ICD-10-CM | POA: Diagnosis not present

## 2021-10-18 DIAGNOSIS — R2681 Unsteadiness on feet: Secondary | ICD-10-CM | POA: Diagnosis not present

## 2021-10-18 DIAGNOSIS — R2689 Other abnormalities of gait and mobility: Secondary | ICD-10-CM

## 2021-10-18 NOTE — Therapy (Signed)
?OUTPATIENT PHYSICAL THERAPY TREATMENT NOTE ?     ? ?Patient Name: [name] [name] ?MRN: [id number] ?DOB:1961/05/26, 61 y.o., male ?Today's Date: 10/18/2021 ? ?PCP: Scarlett Presto, MD ?REFERRING PROVIDER: Deitra Mayo, MD ? ? PT End of Session - 10/18/21 1101   ? ? Visit Number 26   ? Number of Visits 35   ? Date for PT Re-Evaluation 12/02/21   ? Authorization Type UHC medicare with medicaid secondary so 10th visit progress note   ? PT Start Time 1100   ? PT Stop Time 1140   ? PT Time Calculation (min) 40 min   ? Equipment Utilized During Treatment Gait belt   ? Activity Tolerance Patient tolerated treatment well;Patient limited by pain   ? Behavior During Therapy [facility] for tasks assessed/performed   ? ?  ?  ? ?  ? ? ?Past Medical History:  ?Diagnosis Date  ? Anemia   ? Anemia 08/06/2012  ? Aortic valve mass 12/30/2015  ? Community acquired pneumonia 11/07/2011  ? DDD (degenerative disc disease), lumbosacral   ?  and grade 2 slip  ? GERD (gastroesophageal reflux disease)   ? Heart murmur   ? History of anemia   ? no current med.  ? Hypertension   ? states is borderline on medication; has been on med. x 5-6 yr.  ? Hypokalemia 05/30/2018  ? Impingement syndrome of shoulder region 10/2014  ? left  ? Insomnia 06/01/2015  ? Ischemic ulcer of toe of left foot ([place])   ? Left shoulder pain 12/16/2015  ? Low back pain without sciatica 10/29/2009  ? Lupus ([place])   ? Peripheral vascular disease ([place])   ? Pneumonia   ? RA (rheumatoid arthritis) ([place])   ? Rotator cuff tear 11/04/2014  ? ?Past Surgical History:  ?Procedure Laterality Date  ? ABDOMINAL AORTAGRAM  02/20/2019  ? ABDOMINAL AORTOGRAM W/LOWER EXTREMITY N/A 02/20/2019  ? Procedure: ABDOMINAL AORTOGRAM W/LOWER EXTREMITY;  Surgeon: Marty Heck, MD;  Location: [place] [facility];  Service: Cardiovascular;  Laterality: N/A;  ? ACHILLES TENDON SURGERY Bilateral   ? AMPUTATION Left 05/11/2020  ? Procedure: LEFT ABOVE KNEE AMPUTATION;  Surgeon: Angelia Mould, MD;   Location: [place];  Service: Vascular;  Laterality: Left;  ? ENDARTERECTOMY POPLITEAL Left 05/01/2020  ? Procedure: ENDARTERECTOMY POPLITEAL;  Surgeon: Waynetta Sandy, MD;  Location: [place];  Service: Vascular;  Laterality: Left;  ? FASCIOTOMY Left 05/01/2020  ? Procedure: FASCIOTOMY;  Surgeon: Waynetta Sandy, MD;  Location: [place];  Service: Vascular;  Laterality: Left;  ? FEMORAL-POPLITEAL BYPASS GRAFT Left 02/26/2019  ? Procedure: BYPASS GRAFT FEMORAL-POPLITEAL ARTERY LEFT LEG USING 38m PROPATEN GRAFT;  Surgeon: BSerafina Mitchell MD;  [place]  Service: Vascular;  Laterality: Left;  ? INTRAOPERATIVE ARTERIOGRAM Left 01/22/2020  ? Procedure: INTRA OPERATIVE ARTERIOGRAM with administration of thrombolyics in Left femoral to popliteal bypass.;  Surgeon: CMarty Heck MD;  Location[place]  Service: Vascular;  Laterality: Left;  ? LEFT HEART CATH AND CORONARY ANGIOGRAPHY N/A 03/15/2017  ? Procedure: LEFT HEART CATH AND CORONARY ANGIOGRAPHY;  Surgeon: ENelva Bush MD;  [place];  Service: Cardiovascular;  Laterality: N/A;  ? LOWER EXTREMITY INTERVENTION Left 04/29/2020  ? Procedure: LOWER EXTREMITY INTERVENTION- LYSIS;  Surgeon: CMarty Heck MD;  [place];  Service: Cardiovascular;  Laterality: Left;  ? OSTEOTOMY AND ULNAR SHORTENING Right 07/22/2002  ? PATCH ANGIOPLASTY Left 05/01/2020  ? Procedure: PATCH ANGIOPLASTY USING  [place];  Surgeon: Waynetta Sandy, MD;  Location: [place];  Service: Vascular;  Laterality: Left;  ? PERIPHERAL VASCULAR ATHERECTOMY  01/23/2020  ? Procedure: PERIPHERAL VASCULAR ATHERECTOMY;  Surgeon: Marty Heck, MD;  Location: [place] [facility];  Service: Cardiovascular;;  ? PERIPHERAL VASCULAR BALLOON ANGIOPLASTY  01/23/2020  ? Procedure: PERIPHERAL VASCULAR BALLOON ANGIOPLASTY;  Surgeon: Marty Heck, MD;  Location: [place] [facility] LAB;  Service: Cardiovascular;;  ?  PERIPHERAL VASCULAR INTERVENTION Left 04/30/2020  ? Procedure: PERIPHERAL VASCULAR INTERVENTION;  Surgeon: Cherre Robins, MD;  Location: [place] [facility];  Service: Cardiovascular;  Laterality: Left;  ? PERIPHERAL VASCULAR THROMBECTOMY N/A 01/23/2020  ? Procedure: lysis recheck;  Surgeon: Marty Heck, MD;  Location: [place] [facility];  Service: Cardiovascular;  Laterality: N/A;  + [place] ?  ? PERIPHERAL VASCULAR THROMBECTOMY N/A 04/30/2020  ? Procedure: Lysis Recheck;  Surgeon: Cherre Robins, MD;  Location: [place] [facility];  Service: Cardiovascular;  Laterality: N/A;  ? SHOULDER ARTHROSCOPY WITH BICEPSTENOTOMY Right 03/11/2015  ? Procedure: SHOULDER ARTHROSCOPY WITH BICEPSTENOTOMY;  Surgeon: Ninetta Lights, MD;  Location: [place];  Service: Orthopedics;  Laterality: Right;  ? SHOULDER ARTHROSCOPY WITH DISTAL CLAVICLE RESECTION Left 11/19/2014  ? Procedure: SHOULDER ARTHROSCOPY WITH DISTAL CLAVICLE RESECTION;  Surgeon: Kathryne Hitch, MD;  Location: [place];  Service: Orthopedics;  Laterality: Left;  ? SHOULDER ARTHROSCOPY WITH ROTATOR CUFF REPAIR AND SUBACROMIAL DECOMPRESSION Left 11/19/2014  ? Procedure: LEFT SHOULDER ARTHROSCOPY, DEBRIDEMENT DISTAL CLAVICLE EXCISION, ACROMIOPLASTY WITH ROTATOR CUFF REPAIR ;  Surgeon: Kathryne Hitch, MD;  Location: [place];  Service: Orthopedics;  Laterality: Left;  ? THROMBECTOMY OF BYPASS GRAFT FEMORAL- POPLITEAL ARTERY Left 05/01/2020  ? Procedure: THROMBECTOMY OF LOWER EXTREMITY;  Surgeon: Waynetta Sandy, MD;  Location: [place];  Service: Vascular;  Laterality: Left;  ? TRANSFORAMINAL LUMBAR INTERBODY FUSION (TLIF) WITH PEDICLE SCREW FIXATION 1 LEVEL N/A 01/03/2018  ? Procedure: TRANSFORAMINAL LUMBAR INTERBODY FUSION (TLIF) LUMBAR FIVE-SACRAL ONE;  Surgeon: Melina Schools, MD;  Location: [place];  Service: Orthopedics;  Laterality: N/A;  ? ULTRASOUND GUIDANCE FOR VASCULAR ACCESS Right 01/22/2020  ?  Procedure: ULTRASOUND GUIDANCE FOR VASCULAR ACCESS Right Common Femoral Artery.;  Surgeon: Marty Heck, MD;  Location: [place];  Service: Vascular;  Laterality: Right;  ? VIDEO ASSISTED THORACOSCOPY (VATS)/DECORTICATION Right 11/21/2011  ? drainage of empyema  ? ?Patient Active Problem List  ? Diagnosis Date Noted  ? Bite wound of left hand 09/27/2021  ? Arm pain 07/25/2021  ? Paronychia of great toe 01/28/2021  ? Onychomycosis 01/28/2021  ? Critical limb ischemia with history of revascularization of same extremity ([place]) 04/28/2020  ? History of COVID-19 05/10/2019  ? PAD (peripheral artery disease) ([place])   ? History of critical lower limb ischemia 02/19/2019  ? S/P lumbar fusion, Lower back pain 01/03/2018  ? Coronary artery disease involving native coronary artery of native heart with unstable angina pectoris ([place])   ? Preventative health care 03/13/2017  ? Transaminitis 12/16/2015  ? Bilateral shoulder pain 08/20/2012  ? Tobacco use disorder 08/20/2012  ? HTN (hypertension) 04/20/2011  ? HLD (hyperlipidemia) 10/08/2009  ? Rheumatoid arthritis ([place]) 08/11/2009  ? ? ?REFERRING DIAG: left AKA ? ?THERAPY DIAG:  ?Other abnormalities of gait and mobility ? ?Unsteadiness on feet ? ?PERTINENT HISTORY: RA, DM2, HTN, PAD, DDD,  s/p multiple L LE revascularizations discharged from hospital a 05/05/20.  At that time he was deemed to  not be a candidate for further revasc procedures.  Dc'd home with a known bypass occlusion with a plan for AKA if worsening pain. Returned to ED 05/08/20  for increased pain and seeking amputation. s/p L AKA 05/11/20  ? ?PRECAUTIONS: fall ? ?SUBJECTIVE: Pt  reports that PT wasn't kidding that he would be sore after fall. Is still feeling really sore in shoulders and low back. Has been using heating pad and icy hot. ? ?PAIN:  ?Are you having pain? Yes ?NPRS scale:7/10 ?Pain location: low back and shoulders ?Pain orientation:  ?PAIN TYPE: acute and chronic ?Pain description: sore ?Aggravating  factors: arthritis and fall this morning.  ?Relieving factors: ? ? ? ?TODAY'S TREATMENT: ?  Prosthetics: ?Prosthetic care comments: Currently wearing 8 ply ?Donning prosthesis: Modified independence and SBA ?Dof

## 2021-10-21 ENCOUNTER — Ambulatory Visit: Payer: Medicare Other

## 2021-10-21 DIAGNOSIS — R2681 Unsteadiness on feet: Secondary | ICD-10-CM

## 2021-10-21 DIAGNOSIS — M6281 Muscle weakness (generalized): Secondary | ICD-10-CM | POA: Diagnosis not present

## 2021-10-21 DIAGNOSIS — R293 Abnormal posture: Secondary | ICD-10-CM | POA: Diagnosis not present

## 2021-10-21 DIAGNOSIS — R2689 Other abnormalities of gait and mobility: Secondary | ICD-10-CM | POA: Diagnosis not present

## 2021-10-21 NOTE — Therapy (Signed)
?OUTPATIENT PHYSICAL THERAPY TREATMENT NOTE ?     ? ?Patient Name: [name] [name] ?MRN: [id number] ?DOB:06-Sep-1960, 61 y.o., male ?Today's Date: 10/21/2021 ? ?PCP: Scarlett Presto, MD ?REFERRING PROVIDER: Deitra Mayo, MD ? ? PT End of Session - 10/21/21 1232   ? ? Visit Number 27   ? Number of Visits 35   ? Date for PT Re-Evaluation 12/02/21   ? Authorization Type UHC medicare with medicaid secondary so 10th visit progress note   ? PT Start Time 1230   ? PT Stop Time 1309   ? PT Time Calculation (min) 39 min   ? Equipment Utilized During Treatment Gait belt   ? Activity Tolerance Patient tolerated treatment well;Patient limited by pain   ? Behavior During Therapy [facility] for tasks assessed/performed   ? ?  ?  ? ?  ? ? ?Past Medical History:  ?Diagnosis Date  ? Anemia   ? Anemia 08/06/2012  ? Aortic valve mass 12/30/2015  ? Community acquired pneumonia 11/07/2011  ? DDD (degenerative disc disease), lumbosacral   ?  and grade 2 slip  ? GERD (gastroesophageal reflux disease)   ? Heart murmur   ? History of anemia   ? no current med.  ? Hypertension   ? states is borderline on medication; has been on med. x 5-6 yr.  ? Hypokalemia 05/30/2018  ? Impingement syndrome of shoulder region 10/2014  ? left  ? Insomnia 06/01/2015  ? Ischemic ulcer of toe of left foot ([place])   ? Left shoulder pain 12/16/2015  ? Low back pain without sciatica 10/29/2009  ? Lupus ([place])   ? Peripheral vascular disease ([place])   ? Pneumonia   ? RA (rheumatoid arthritis) ([place])   ? Rotator cuff tear 11/04/2014  ? ?Past Surgical History:  ?Procedure Laterality Date  ? ABDOMINAL AORTAGRAM  02/20/2019  ? ABDOMINAL AORTOGRAM W/LOWER EXTREMITY N/A 02/20/2019  ? Procedure: ABDOMINAL AORTOGRAM W/LOWER EXTREMITY;  Surgeon: Marty Heck, MD;  Location: [place] [facility];  Service: Cardiovascular;  Laterality: N/A;  ? ACHILLES TENDON SURGERY Bilateral   ? AMPUTATION Left 05/11/2020  ? Procedure: LEFT ABOVE KNEE AMPUTATION;  Surgeon: Angelia Mould, MD;   Location: [place];  Service: Vascular;  Laterality: Left;  ? ENDARTERECTOMY POPLITEAL Left 05/01/2020  ? Procedure: ENDARTERECTOMY POPLITEAL;  Surgeon: Waynetta Sandy, MD;  Location: [place];  Service: Vascular;  Laterality: Left;  ? FASCIOTOMY Left 05/01/2020  ? Procedure: FASCIOTOMY;  Surgeon: Waynetta Sandy, MD;  Location: [place];  Service: Vascular;  Laterality: Left;  ? FEMORAL-POPLITEAL BYPASS GRAFT Left 02/26/2019  ? Procedure: BYPASS GRAFT FEMORAL-POPLITEAL ARTERY LEFT LEG USING 1m PROPATEN GRAFT;  Surgeon: BSerafina Mitchell MD;  [place]  Service: Vascular;  Laterality: Left;  ? INTRAOPERATIVE ARTERIOGRAM Left 01/22/2020  ? Procedure: INTRA OPERATIVE ARTERIOGRAM with administration of thrombolyics in Left femoral to popliteal bypass.;  Surgeon: CMarty Heck MD;  [place]  Service: Vascular;  Laterality: Left;  ? LEFT HEART CATH AND CORONARY ANGIOGRAPHY N/A 03/15/2017  ? Procedure: LEFT HEART CATH AND CORONARY ANGIOGRAPHY;  Surgeon: ENelva Bush MD;  [place];  Service: Cardiovascular;  Laterality: N/A;  ? LOWER EXTREMITY INTERVENTION Left 04/29/2020  ? Procedure: LOWER EXTREMITY INTERVENTION- LYSIS;  Surgeon: CMarty Heck MD;  [place];  Service: Cardiovascular;  Laterality: Left;  ? OSTEOTOMY AND ULNAR SHORTENING Right 07/22/2002  ? PATCH ANGIOPLASTY Left 05/01/2020  ? Procedure: PATCH ANGIOPLASTY USING  [place];  Surgeon: Waynetta Sandy, MD;  Location: [place];  Service: Vascular;  Laterality: Left;  ? PERIPHERAL VASCULAR ATHERECTOMY  01/23/2020  ? Procedure: PERIPHERAL VASCULAR ATHERECTOMY;  Surgeon: Marty Heck, MD;  Location: [place] [facility];  Service: Cardiovascular;;  ? PERIPHERAL VASCULAR BALLOON ANGIOPLASTY  01/23/2020  ? Procedure: PERIPHERAL VASCULAR BALLOON ANGIOPLASTY;  Surgeon: Marty Heck, MD;  Location: [place] [facility];  Service: Cardiovascular;;  ?  PERIPHERAL VASCULAR INTERVENTION Left 04/30/2020  ? Procedure: PERIPHERAL VASCULAR INTERVENTION;  Surgeon: Cherre Robins, MD;  Location: [place] [facility];  Service: Cardiovascular;  Laterality: Left;  ? PERIPHERAL VASCULAR THROMBECTOMY N/A 01/23/2020  ? Procedure: lysis recheck;  Surgeon: Marty Heck, MD;  Location: [place] [facility];  Service: Cardiovascular;  Laterality: N/A;  + [place] ?  ? PERIPHERAL VASCULAR THROMBECTOMY N/A 04/30/2020  ? Procedure: Lysis Recheck;  Surgeon: Cherre Robins, MD;  Location: [place] [facility];  Service: Cardiovascular;  Laterality: N/A;  ? SHOULDER ARTHROSCOPY WITH BICEPSTENOTOMY Right 03/11/2015  ? Procedure: SHOULDER ARTHROSCOPY WITH BICEPSTENOTOMY;  Surgeon: Ninetta Lights, MD;  Location: [place];  Service: Orthopedics;  Laterality: Right;  ? SHOULDER ARTHROSCOPY WITH DISTAL CLAVICLE RESECTION Left 11/19/2014  ? Procedure: SHOULDER ARTHROSCOPY WITH DISTAL CLAVICLE RESECTION;  Surgeon: Kathryne Hitch, MD;  Location: [place];  Service: Orthopedics;  Laterality: Left;  ? SHOULDER ARTHROSCOPY WITH ROTATOR CUFF REPAIR AND SUBACROMIAL DECOMPRESSION Left 11/19/2014  ? Procedure: LEFT SHOULDER ARTHROSCOPY, DEBRIDEMENT DISTAL CLAVICLE EXCISION, ACROMIOPLASTY WITH ROTATOR CUFF REPAIR ;  Surgeon: Kathryne Hitch, MD;  Location: [place];  Service: Orthopedics;  Laterality: Left;  ? THROMBECTOMY OF BYPASS GRAFT FEMORAL- POPLITEAL ARTERY Left 05/01/2020  ? Procedure: THROMBECTOMY OF LOWER EXTREMITY;  Surgeon: Waynetta Sandy, MD;  Location: [place];  Service: Vascular;  Laterality: Left;  ? TRANSFORAMINAL LUMBAR INTERBODY FUSION (TLIF) WITH PEDICLE SCREW FIXATION 1 LEVEL N/A 01/03/2018  ? Procedure: TRANSFORAMINAL LUMBAR INTERBODY FUSION (TLIF) LUMBAR FIVE-SACRAL ONE;  Surgeon: Melina Schools, MD;  Location: [place];  Service: Orthopedics;  Laterality: N/A;  ? ULTRASOUND GUIDANCE FOR VASCULAR ACCESS Right 01/22/2020  ?  Procedure: ULTRASOUND GUIDANCE FOR VASCULAR ACCESS Right Common Femoral Artery.;  Surgeon: Marty Heck, MD;  Location: [place];  Service: Vascular;  Laterality: Right;  ? VIDEO ASSISTED THORACOSCOPY (VATS)/DECORTICATION Right 11/21/2011  ? drainage of empyema  ? ?Patient Active Problem List  ? Diagnosis Date Noted  ? Bite wound of left hand 09/27/2021  ? Arm pain 07/25/2021  ? Paronychia of great toe 01/28/2021  ? Onychomycosis 01/28/2021  ? Critical limb ischemia with history of revascularization of same extremity ([place]) 04/28/2020  ? History of COVID-19 05/10/2019  ? PAD (peripheral artery disease) ([place])   ? History of critical lower limb ischemia 02/19/2019  ? S/P lumbar fusion, Lower back pain 01/03/2018  ? Coronary artery disease involving native coronary artery of native heart with unstable angina pectoris ([place])   ? Preventative health care 03/13/2017  ? Transaminitis 12/16/2015  ? Bilateral shoulder pain 08/20/2012  ? Tobacco use disorder 08/20/2012  ? HTN (hypertension) 04/20/2011  ? HLD (hyperlipidemia) 10/08/2009  ? Rheumatoid arthritis ([place]) 08/11/2009  ? ? ?REFERRING DIAG: left AKA ? ?THERAPY DIAG:  ?Other abnormalities of gait and mobility ? ?Unsteadiness on feet ? ?Muscle weakness (generalized) ? ?PERTINENT HISTORY: RA, DM2, HTN, PAD, DDD,  s/p multiple L LE revascularizations discharged from hospital a 05/05/20.  At that time  he was deemed to not be a candidate for further revasc procedures.  Dc'd home with a known bypass occlusion with a plan for AKA if worsening pain. Returned to ED 05/08/20  for increased pain and seeking amputation. s/p L AKA 05/11/20  ? ?PRECAUTIONS: fall ? ?SUBJECTIVE: Pt reports that phantom pains are bothering him more today. Feels like great toe is hurting. Ran out of antiperspirant and needs to get more. ? ?PAIN:  ?Are you having pain? Yes ?NPRS scale:4/10 ?Pain location: shoulders ?Pain orientation:  ?PAIN TYPE: acute and chronic ?Pain description: sore ?Aggravating  factors: arthritis ?Relieving factors: ? ? ? ?TODAY'S TREATMENT: ?  Prosthetics: ?Prosthetic care comments: Currently wearing 8 ply. Gave pt suggestion of [employer] for antiperspirant if can no longe

## 2021-10-26 ENCOUNTER — Ambulatory Visit: Payer: Medicare Other | Admitting: Rehabilitation

## 2021-10-28 ENCOUNTER — Ambulatory Visit: Payer: Medicare Other | Admitting: Physical Therapy

## 2021-11-02 ENCOUNTER — Encounter: Payer: Medicare Other | Admitting: Rehabilitation

## 2021-11-04 ENCOUNTER — Encounter: Payer: Medicare Other | Admitting: Physical Therapy

## 2021-11-04 ENCOUNTER — Encounter: Payer: Self-pay | Admitting: Physical Therapy

## 2021-11-04 ENCOUNTER — Ambulatory Visit: Payer: Medicare Other | Attending: Vascular Surgery | Admitting: Physical Therapy

## 2021-11-04 DIAGNOSIS — M6281 Muscle weakness (generalized): Secondary | ICD-10-CM | POA: Insufficient documentation

## 2021-11-04 DIAGNOSIS — R2689 Other abnormalities of gait and mobility: Secondary | ICD-10-CM | POA: Diagnosis not present

## 2021-11-04 DIAGNOSIS — R293 Abnormal posture: Secondary | ICD-10-CM | POA: Insufficient documentation

## 2021-11-04 DIAGNOSIS — R2681 Unsteadiness on feet: Secondary | ICD-10-CM | POA: Insufficient documentation

## 2021-11-04 NOTE — Therapy (Signed)
?OUTPATIENT PHYSICAL THERAPY TREATMENT NOTE ?     ? ?Patient Name: [name] [name] ?MRN: [id number] ?DOB:12-08-1960, 61 y.o., male ?Today's Date: 11/04/2021 ? ?PCP: Scarlett Presto, MD ?REFERRING PROVIDER: Deitra Mayo, MD ? ? PT End of Session - 11/04/21 1238   ? ? Visit Number 28   ? Number of Visits 35   ? Date for PT Re-Evaluation 12/02/21   ? Authorization Type UHC medicare with medicaid secondary so 10th visit progress note   ? Progress Note Due on Visit 30   ? PT Start Time 1235   ? PT Stop Time 1313   ? PT Time Calculation (min) 38 min   ? Equipment Utilized During Treatment Gait belt   ? Activity Tolerance Patient tolerated treatment well   ? Behavior During Therapy [facility] for tasks assessed/performed   ? ?  ?  ? ?  ? ? ?Past Medical History:  ?Diagnosis Date  ? Anemia   ? Anemia 08/06/2012  ? Aortic valve mass 12/30/2015  ? Community acquired pneumonia 11/07/2011  ? DDD (degenerative disc disease), lumbosacral   ?  and grade 2 slip  ? GERD (gastroesophageal reflux disease)   ? Heart murmur   ? History of anemia   ? no current med.  ? Hypertension   ? states is borderline on medication; has been on med. x 5-6 yr.  ? Hypokalemia 05/30/2018  ? Impingement syndrome of shoulder region 10/2014  ? left  ? Insomnia 06/01/2015  ? Ischemic ulcer of toe of left foot ([place])   ? Left shoulder pain 12/16/2015  ? Low back pain without sciatica 10/29/2009  ? Lupus ([place])   ? Peripheral vascular disease ([place])   ? Pneumonia   ? RA (rheumatoid arthritis) ([place])   ? Rotator cuff tear 11/04/2014  ? ?Past Surgical History:  ?Procedure Laterality Date  ? ABDOMINAL AORTAGRAM  02/20/2019  ? ABDOMINAL AORTOGRAM W/LOWER EXTREMITY N/A 02/20/2019  ? Procedure: ABDOMINAL AORTOGRAM W/LOWER EXTREMITY;  Surgeon: Marty Heck, MD;  Location: [place] [facility];  Service: Cardiovascular;  Laterality: N/A;  ? ACHILLES TENDON SURGERY Bilateral   ? AMPUTATION Left 05/11/2020  ? Procedure: LEFT ABOVE KNEE AMPUTATION;  Surgeon: Angelia Mould,  MD;  Location: [place];  Service: Vascular;  Laterality: Left;  ? ENDARTERECTOMY POPLITEAL Left 05/01/2020  ? Procedure: ENDARTERECTOMY POPLITEAL;  Surgeon: Waynetta Sandy, MD;  Location: [place];  Service: Vascular;  Laterality: Left;  ? FASCIOTOMY Left 05/01/2020  ? Procedure: FASCIOTOMY;  Surgeon: Waynetta Sandy, MD;  Location: [place];  Service: Vascular;  Laterality: Left;  ? FEMORAL-POPLITEAL BYPASS GRAFT Left 02/26/2019  ? Procedure: BYPASS GRAFT FEMORAL-POPLITEAL ARTERY LEFT LEG USING 82m PROPATEN GRAFT;  Surgeon: BSerafina Mitchell MD;  [place]  Service: Vascular;  Laterality: Left;  ? INTRAOPERATIVE ARTERIOGRAM Left 01/22/2020  ? Procedure: INTRA OPERATIVE ARTERIOGRAM with administration of thrombolyics in Left femoral to popliteal bypass.;  Surgeon: CMarty Heck MD;  [place]  Service: Vascular;  Laterality: Left;  ? LEFT HEART CATH AND CORONARY ANGIOGRAPHY N/A 03/15/2017  ? Procedure: LEFT HEART CATH AND CORONARY ANGIOGRAPHY;  Surgeon: ENelva Bush MD;  [place];  Service: Cardiovascular;  Laterality: N/A;  ? LOWER EXTREMITY INTERVENTION Left 04/29/2020  ? Procedure: LOWER EXTREMITY INTERVENTION- LYSIS;  Surgeon: CMarty Heck MD;  [place];  Service: Cardiovascular;  Laterality: Left;  ? OSTEOTOMY AND ULNAR SHORTENING Right 07/22/2002  ? PATCH ANGIOPLASTY Left 05/01/2020  ?  Procedure: [place];  Surgeon: Waynetta Sandy, MD;  Location: [place];  Service: Vascular;  Laterality: Left;  ? PERIPHERAL VASCULAR ATHERECTOMY  01/23/2020  ? Procedure: PERIPHERAL VASCULAR ATHERECTOMY;  Surgeon: Marty Heck, MD;  Location: [place] [facility];  Service: Cardiovascular;;  ? PERIPHERAL VASCULAR BALLOON ANGIOPLASTY  01/23/2020  ? Procedure: PERIPHERAL VASCULAR BALLOON ANGIOPLASTY;  Surgeon: Marty Heck, MD;  Location: [place] [facility];  Service:  Cardiovascular;;  ? PERIPHERAL VASCULAR INTERVENTION Left 04/30/2020  ? Procedure: PERIPHERAL VASCULAR INTERVENTION;  Surgeon: Cherre Robins, MD;  Location: [place] [facility];  Service: Cardiovascular;  Laterality: Left;  ? PERIPHERAL VASCULAR THROMBECTOMY N/A 01/23/2020  ? Procedure: lysis recheck;  Surgeon: Marty Heck, MD;  Location: [place] [facility];  Service: Cardiovascular;  Laterality: N/A;  + [place] ?  ? PERIPHERAL VASCULAR THROMBECTOMY N/A 04/30/2020  ? Procedure: Lysis Recheck;  Surgeon: Cherre Robins, MD;  Location: [place] [facility];  Service: Cardiovascular;  Laterality: N/A;  ? SHOULDER ARTHROSCOPY WITH BICEPSTENOTOMY Right 03/11/2015  ? Procedure: SHOULDER ARTHROSCOPY WITH BICEPSTENOTOMY;  Surgeon: Ninetta Lights, MD;  Location: [place];  Service: Orthopedics;  Laterality: Right;  ? SHOULDER ARTHROSCOPY WITH DISTAL CLAVICLE RESECTION Left 11/19/2014  ? Procedure: SHOULDER ARTHROSCOPY WITH DISTAL CLAVICLE RESECTION;  Surgeon: Kathryne Hitch, MD;  Location: [place];  Service: Orthopedics;  Laterality: Left;  ? SHOULDER ARTHROSCOPY WITH ROTATOR CUFF REPAIR AND SUBACROMIAL DECOMPRESSION Left 11/19/2014  ? Procedure: LEFT SHOULDER ARTHROSCOPY, DEBRIDEMENT DISTAL CLAVICLE EXCISION, ACROMIOPLASTY WITH ROTATOR CUFF REPAIR ;  Surgeon: Kathryne Hitch, MD;  Location: [place];  Service: Orthopedics;  Laterality: Left;  ? THROMBECTOMY OF BYPASS GRAFT FEMORAL- POPLITEAL ARTERY Left 05/01/2020  ? Procedure: THROMBECTOMY OF LOWER EXTREMITY;  Surgeon: Waynetta Sandy, MD;  Location: [place];  Service: Vascular;  Laterality: Left;  ? TRANSFORAMINAL LUMBAR INTERBODY FUSION (TLIF) WITH PEDICLE SCREW FIXATION 1 LEVEL N/A 01/03/2018  ? Procedure: TRANSFORAMINAL LUMBAR INTERBODY FUSION (TLIF) LUMBAR FIVE-SACRAL ONE;  Surgeon: Melina Schools, MD;  Location: [place];  Service: Orthopedics;  Laterality: N/A;  ? ULTRASOUND GUIDANCE FOR VASCULAR ACCESS Right  01/22/2020  ? Procedure: ULTRASOUND GUIDANCE FOR VASCULAR ACCESS Right Common Femoral Artery.;  Surgeon: Marty Heck, MD;  Location: [place];  Service: Vascular;  Laterality: Right;  ? VIDEO ASSISTED THORACOSCOPY (VATS)/DECORTICATION Right 11/21/2011  ? drainage of empyema  ? ?Patient Active Problem List  ? Diagnosis Date Noted  ? Bite wound of left hand 09/27/2021  ? Arm pain 07/25/2021  ? Paronychia of great toe 01/28/2021  ? Onychomycosis 01/28/2021  ? Critical limb ischemia with history of revascularization of same extremity ([place]) 04/28/2020  ? History of COVID-19 05/10/2019  ? PAD (peripheral artery disease) ([place])   ? History of critical lower limb ischemia 02/19/2019  ? S/P lumbar fusion, Lower back pain 01/03/2018  ? Coronary artery disease involving native coronary artery of native heart with unstable angina pectoris ([place])   ? Preventative health care 03/13/2017  ? Transaminitis 12/16/2015  ? Bilateral shoulder pain 08/20/2012  ? Tobacco use disorder 08/20/2012  ? HTN (hypertension) 04/20/2011  ? HLD (hyperlipidemia) 10/08/2009  ? Rheumatoid arthritis ([place]) 08/11/2009  ? ? ?REFERRING DIAG: left AKA ? ?THERAPY DIAG:  ?Other abnormalities of gait and mobility ? ?Unsteadiness on feet ? ?Muscle weakness (generalized) ? ?Abnormal posture ? ?PERTINENT HISTORY: RA, DM2, HTN, PAD, DDD,  s/p multiple L LE revascularizations discharged from  hospital a 05/05/20.  At that time he was deemed to not be a candidate for further revasc procedures.  Dc'd home with a known bypass occlusion with a plan for AKA if worsening pain. Returned to ED 05/08/20  for increased pain and seeking amputation. s/p L AKA 05/11/20  ? ?PRECAUTIONS: fall ? ?SUBJECTIVE: No falls. Did pull hamstring on right LE late last week using the bands for strengthening. Still sore today, however getting better. Shoulder are as usual, OA acts up at times. ? ?PAIN:  ?Are you having pain? Yes ?NPRS scale:3-4/10 ?Pain location: shoulders ?Pain orientation:   ?PAIN TYPE: acute and chronic ?Pain description: sore ?Aggravating factors: arthritis ?Relieving factors: heat and [place]  ? ? ? ?TODAY'S TREATMENT: ?11/04/2021 ?PROSTHETICS: ?Prosthetic care comments: Currently wea

## 2021-11-08 ENCOUNTER — Encounter: Payer: Medicare Other | Admitting: Rehabilitation

## 2021-11-11 ENCOUNTER — Encounter: Payer: Medicare Other | Admitting: Physical Therapy

## 2021-11-14 ENCOUNTER — Ambulatory Visit: Payer: Medicare Other | Admitting: Rehabilitation

## 2021-11-14 ENCOUNTER — Encounter: Payer: Self-pay | Admitting: Rehabilitation

## 2021-11-14 DIAGNOSIS — M6281 Muscle weakness (generalized): Secondary | ICD-10-CM | POA: Diagnosis not present

## 2021-11-14 DIAGNOSIS — R293 Abnormal posture: Secondary | ICD-10-CM

## 2021-11-14 DIAGNOSIS — R2689 Other abnormalities of gait and mobility: Secondary | ICD-10-CM | POA: Diagnosis not present

## 2021-11-14 DIAGNOSIS — R2681 Unsteadiness on feet: Secondary | ICD-10-CM | POA: Diagnosis not present

## 2021-11-14 NOTE — Therapy (Signed)
OUTPATIENT PHYSICAL THERAPY TREATMENT NOTE       Patient Name: [name] [name] MRN: [id number] DOB:15-Mar-1961, 61 y.o., male Today's Date: 11/14/2021  PCP: Scarlett Presto, MD REFERRING PROVIDER: Deitra Mayo, MD   PT End of Session - 11/14/21 1235     Visit Number 29    Number of Visits 43    Date for PT Re-Evaluation 12/02/21    Authorization Type [employer] medicare with medicaid secondary so 10th visit progress note    Progress Note Due on Visit 17    PT Start Time 1231    PT Stop Time 1315    PT Time Calculation (min) 44 min    Equipment Utilized During Treatment Gait belt    Activity Tolerance Patient tolerated treatment well    Behavior During Therapy WFL for tasks assessed/performed             Past Medical History:  Diagnosis Date   Anemia    Anemia 08/06/2012   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   Hypertension    states is borderline on medication; has been on med. x 5-6 yr.   Hypokalemia 05/30/2018   Impingement syndrome of shoulder region 10/2014   left   Insomnia 06/01/2015   Ischemic ulcer of toe of left foot (HCC)    Left shoulder pain 12/16/2015   Low back pain without sciatica 10/29/2009   Lupus ([place])    Peripheral vascular disease (HCC)    Pneumonia    RA (rheumatoid arthritis) ([place])    Rotator cuff tear 11/04/2014   Past Surgical History:  Procedure Laterality Date   ABDOMINAL AORTAGRAM  02/20/2019   ABDOMINAL AORTOGRAM W/LOWER EXTREMITY N/A 02/20/2019   Procedure: ABDOMINAL AORTOGRAM W/LOWER EXTREMITY;  Surgeon: Marty Heck, MD;  Location: [facility];  Service: Cardiovascular;  Laterality: N/A;   ACHILLES TENDON SURGERY Bilateral    AMPUTATION Left 05/11/2020   Procedure: LEFT ABOVE KNEE AMPUTATION;  Surgeon: Angelia Mould,  MD;  Location: [place];  Service: Vascular;  Laterality: Left;   ENDARTERECTOMY POPLITEAL Left 05/01/2020   Procedure: ENDARTERECTOMY POPLITEAL;  Surgeon: Waynetta Sandy, MD;  Location: [facility];  Service: Vascular;  Laterality: Left;   FASCIOTOMY Left 05/01/2020   Procedure: FASCIOTOMY;  Surgeon: Waynetta Sandy, MD;  Location: [facility];  Service: Vascular;  Laterality: Left;   FEMORAL-POPLITEAL BYPASS GRAFT Left 02/26/2019   Procedure: BYPASS GRAFT FEMORAL-POPLITEAL ARTERY LEFT LEG USING 58m PROPATEN GRAFT;  Surgeon: BSerafina Mitchell MD;  [place] [facility]  Service: Vascular;  Laterality: Left;   INTRAOPERATIVE ARTERIOGRAM Left 01/22/2020   Procedure: INTRA OPERATIVE ARTERIOGRAM with administration of thrombolyics in Left femoral to popliteal bypass.;  Surgeon: CMarty Heck MD;  [place] [facility]  Service: Vascular;  Laterality: Left;   LEFT HEART CATH AND CORONARY ANGIOGRAPHY N/A 03/15/2017   Procedure: LEFT HEART CATH AND CORONARY ANGIOGRAPHY;  Surgeon: ENelva Bush MD;  [facility];  Service: Cardiovascular;  Laterality: N/A;   LOWER EXTREMITY INTERVENTION Left 04/29/2020   Procedure: LOWER EXTREMITY INTERVENTION- LYSIS;  Surgeon: CMarty Heck MD;  [facility];  Service: Cardiovascular;  Laterality: Left;   OSTEOTOMY AND ULNAR SHORTENING Right 07/22/2002   PATCH ANGIOPLASTY Left 05/01/2020  Procedure: Hodgkins;  Surgeon: Waynetta Sandy, MD;  Location: [facility];  Service: Vascular;  Laterality: Left;   PERIPHERAL VASCULAR ATHERECTOMY  01/23/2020   Procedure: PERIPHERAL VASCULAR ATHERECTOMY;  Surgeon: Marty Heck, MD;  Location: [facility];  Service: Cardiovascular;;   PERIPHERAL VASCULAR BALLOON ANGIOPLASTY  01/23/2020   Procedure: PERIPHERAL VASCULAR BALLOON ANGIOPLASTY;  Surgeon: Marty Heck, MD;  Location: [facility];  Service:  Cardiovascular;;   PERIPHERAL VASCULAR INTERVENTION Left 04/30/2020   Procedure: PERIPHERAL VASCULAR INTERVENTION;  Surgeon: Cherre Robins, MD;  Location: [facility];  Service: Cardiovascular;  Laterality: Left;   PERIPHERAL VASCULAR THROMBECTOMY N/A 01/23/2020   Procedure: lysis recheck;  Surgeon: Marty Heck, MD;  Location: [facility];  Service: Cardiovascular;  Laterality: N/A;  + [place]    PERIPHERAL VASCULAR THROMBECTOMY N/A 04/30/2020   Procedure: Lysis Recheck;  Surgeon: Cherre Robins, MD;  Location: [facility];  Service: Cardiovascular;  Laterality: N/A;   SHOULDER ARTHROSCOPY WITH BICEPSTENOTOMY Right 03/11/2015   Procedure: SHOULDER ARTHROSCOPY WITH BICEPSTENOTOMY;  Surgeon: Ninetta Lights, MD;  Location: [facility];  Service: Orthopedics;  Laterality: Right;   SHOULDER ARTHROSCOPY WITH DISTAL CLAVICLE RESECTION Left 11/19/2014   Procedure: SHOULDER ARTHROSCOPY WITH DISTAL CLAVICLE RESECTION;  Surgeon: Kathryne Hitch, MD;  Location: [facility];  Service: Orthopedics;  Laterality: Left;   SHOULDER ARTHROSCOPY WITH ROTATOR CUFF REPAIR AND SUBACROMIAL DECOMPRESSION Left 11/19/2014   Procedure: LEFT SHOULDER ARTHROSCOPY, DEBRIDEMENT DISTAL CLAVICLE EXCISION, ACROMIOPLASTY WITH ROTATOR CUFF REPAIR ;  Surgeon: Kathryne Hitch, MD;  Location: [facility];  Service: Orthopedics;  Laterality: Left;   THROMBECTOMY OF BYPASS GRAFT FEMORAL- POPLITEAL ARTERY Left 05/01/2020   Procedure: THROMBECTOMY OF LOWER EXTREMITY;  Surgeon: Waynetta Sandy, MD;  Location: [facility];  Service: Vascular;  Laterality: Left;   TRANSFORAMINAL LUMBAR INTERBODY FUSION (TLIF) WITH PEDICLE SCREW FIXATION 1 LEVEL N/A 01/03/2018   Procedure: TRANSFORAMINAL LUMBAR INTERBODY FUSION (TLIF) LUMBAR FIVE-SACRAL ONE;  Surgeon: Melina Schools, MD;  Location: [facility];  Service: Orthopedics;  Laterality: N/A;   ULTRASOUND GUIDANCE FOR VASCULAR ACCESS Right  01/22/2020   Procedure: ULTRASOUND GUIDANCE FOR VASCULAR ACCESS Right Common Femoral Artery.;  Surgeon: Marty Heck, MD;  Location: [facility];  Service: Vascular;  Laterality: Right;   VIDEO ASSISTED THORACOSCOPY (VATS)/DECORTICATION Right 11/21/2011   drainage of empyema   Patient Active Problem List   Diagnosis Date Noted   Bite wound of left hand 09/27/2021   Arm pain 07/25/2021   Paronychia of great toe 01/28/2021   Onychomycosis 01/28/2021   Critical limb ischemia with history of revascularization of same extremity ([place]) 04/28/2020   History of COVID-19 05/10/2019   PAD (peripheral artery disease) ([place])    History of critical lower limb ischemia 02/19/2019   S/P lumbar fusion, Lower back pain 01/03/2018   Coronary artery disease involving native coronary artery of native heart with unstable angina pectoris ([place])    Preventative health care 03/13/2017   Transaminitis 12/16/2015   Bilateral shoulder pain 08/20/2012   Tobacco use disorder 08/20/2012   HTN (hypertension) 04/20/2011   HLD (hyperlipidemia) 10/08/2009   Rheumatoid arthritis ([place]) 08/11/2009    REFERRING DIAG: left AKA  THERAPY DIAG:  Other abnormalities of gait and mobility  Unsteadiness on feet  Muscle weakness (generalized)  Abnormal posture  PERTINENT HISTORY: RA, DM2, HTN, PAD, DDD,  s/p multiple L LE revascularizations discharged from  hospital a 05/05/20.  At that time he was deemed to not be a candidate for further revasc procedures.  Dc'd home with a known bypass occlusion with a plan for AKA if worsening pain. Returned to ED 05/08/20  for increased pain and seeking amputation. s/p L AKA 05/11/20   PRECAUTIONS: fall  SUBJECTIVE: Still having some pain in R hamstring, but getting better slowly   PAIN:  Are you having pain? Yes NPRS scale:3-4/10 Pain location: shoulders Pain orientation:  PAIN TYPE: acute and chronic Pain description: sore Aggravating factors: arthritis Relieving factors: heat  and [place]     TODAY'S TREATMENT: 11/04/2021 PROSTHETICS: Prosthetic care comments: Currently wearing 8 ply socks. Back to using antiperspirant for sweat management.  Continue to educate on sweat management, it seems that secret is not working, so mentioned getting prescription from the MD or using sweat block.  Pt verbalized understanding.  Donning prosthesis: Modified independence and SBA Doffing prosthesis: Modified independence Prosthetic wear tolerance: 8-12 hours/day, 7 days/week Prosthetic weight bearing tolerance:  Residual limb condition: pt reports skin intact   GAIT: Gait pattern: step through pattern, decreased step length- Right, decreased stance time- Left, decreased stride length, trunk flexed, and narrow BOS Distance walked: 250' with RW, then 20' x 2 between tasks Assistive device utilized: Environmental consultant - 2 wheeled and prosthesis Level of assistance:  min guard assist Comments: cues for posture, to increased base of support and for prosthetic step placement. Continue to use locked out prosthesis with gait with pt able to self lock/unlock prosthetic knee. Did not have any IR noted in this session, lanyard tight throughout.    NMR:  Had pt walk to counter top and to assess STG, had pt work on moving R and L with intermittent UE support moving cups from posterior R to upper cabinet and then from posterior L to upper cabinet.  He was able to stand just over 3 mins before needing rest break.  Continue to see marked difficulty with prosthetic WB despite cues, therefore had him turn sideways with RUE on counter and PT holding L hand, walking along counter top as he would with cane.  Provided tactile and verbal cues for shifting forward onto prosthesis then advancing RLE.  Then to return to starting position, had him walk backwards again with R hand on counter top and PT holding LUE with marked cues for L lateral weight shift.  Pt did demo improvement within session and wants to practice at  home.  PT recommended that he have assistance if trying this at home, as he does need min A at times, esp for correct technique.         PATIENT EDUCATION: Education details: continue with current HEP; continue to work on increasing gait at home, progress towards goals.  Person educated: Patient Education method: Explanation, Demonstration, and Verbal cues Education comprehension: verbalized understanding, returned demonstration, verbal cues required, and needs further education     HOME EXERCISE PROGRAM: [id number]    GOALS:  SHORT TERM GOALS:   Target date: 11/08/2021  Pt will tolerate wearing prosthesis for 8 hours/day consistently for improved mobility in home. Baseline: wearing 2-3 hours, 2x/day  Goal status: IN PROGRESS  2.  Pt will ambulate 250' on level surfaces inside consistently with RW mod I for improved mobility. Baseline: 250' 11/14/21 Goal status: MET  3.  Pt will be able to maintain standing without UE support/occasional touch performing functional tasks x 3 min for improved balance and standing ADLs. Baseline:3 mins with  intermittent support at counter top while performing tasks.  Goal status: MET    LONG TERM GOALS:  Target date:  12/02/2021  Pt will be independent with progressive HEP for strengthening and balance to continue gains on own. Baseline: PT continues to update Goal status: IN PROGRESS  2.  Pt will increase gait speed to >0.18ms for improved household mobility  Baseline: 10/11/21 0.264m Goal status: IN PROGRESS  3.  Pt will ambulate 100' with cane or crutch for improved short household distances. Baseline: currently needs RW Goal status: INITIAL  4.  Pt will ambulate up/down 4 steps with rails, up/down curb and ramp, mod I for improved community access with prosthesis locked out.  Baseline:  Goal status: IN PROGRESS  5.  Pt will increase Berg from 30 to >34/56 for improved balance and decreased fall risk. Baseline: 10/11/21 30/56 Goal  status: INITIAL     Plan -     Clinical Impression Statement Skilled session focused on assessment of STGs.  He has met 2/3 STGs, partially meeting wear time goal.  He is wearing prosthesis 2-3 hours, 2x/day but is going to work on increasing to 4 hours, 2x/day.     Personal Factors and Comorbidities Comorbidity 3+;Time since onset of injury/illness/exacerbation    Comorbidities RA, DM2, HTN, PAD, DDD,  s/p multiple L LE revascularizations discharged from hospital a 05/05/20.  At that time he was deemed to not be a candidate for further revasc procedures.  Dc'd home with a known bypass occlusion with a plan for AKA if worsening pain. Returned to ED 05/08/20  for increased pain and seeking amputation. s/p L AKA 05/11/20. Pt reports 3 shoulder surgeries on each shoulder and low back surgery    Examination-Activity Limitations Bathing;Locomotion Level;Transfers;Stairs;Stand;Squat;Lift    Examination-Participation Restrictions Church;Meal Prep    Stability/Clinical Decision Making Evolving/Moderate complexity    Rehab Potential Good    PT Frequency 2x / week for 4 weeks followed by 1x/week for 4 weeks   PT Duration 8 weeks    PT Treatment/Interventions ADLs/Self Care Home Management;DME Instruction;Gait training;Stair training;Functional mobility training;Therapeutic activities;Therapeutic exercise;Balance training;Neuromuscular re-education;Manual techniques;Passive range of motion;Prosthetic Training;Patient/family education;Vestibular    PT Next Visit Plan  Continue standing balance and working on upright posture.  Decrease UE support, have him try cane in // bars, more for improving prosthetic WB, I'm not sure he will get on cane anytime soon. GAIT WITH PROSTHESIS LOCKED OUT RIGHT NOW as unable to lock on own, work on weight shifting over prosthesis. Continue to work on bringing pelvis forward over prosthesis.  .  Prosthetic education. Gait in // bars with 1 UE support.    Consulted and Agree with  Plan of Care Patient;             EmCameron SprangPT, MPT CoOakwood Springs[address]uFaterRose [id number]: [phone] Fax:  [phone]/22/23, 3:49 PM

## 2021-11-15 ENCOUNTER — Encounter: Payer: Medicare Other | Admitting: Rehabilitation

## 2021-11-18 ENCOUNTER — Encounter: Payer: Medicare Other | Admitting: Physical Therapy

## 2021-11-21 DIAGNOSIS — M5136 Other intervertebral disc degeneration, lumbar region: Secondary | ICD-10-CM | POA: Diagnosis not present

## 2021-11-21 DIAGNOSIS — G894 Chronic pain syndrome: Secondary | ICD-10-CM | POA: Diagnosis not present

## 2021-11-25 ENCOUNTER — Ambulatory Visit: Payer: Medicare Other

## 2021-11-25 ENCOUNTER — Encounter: Payer: Medicare Other | Admitting: Physical Therapy

## 2021-11-30 ENCOUNTER — Ambulatory Visit: Payer: Medicare Other | Attending: Vascular Surgery | Admitting: Rehabilitation

## 2021-11-30 ENCOUNTER — Encounter: Payer: Self-pay | Admitting: Rehabilitation

## 2021-11-30 DIAGNOSIS — R293 Abnormal posture: Secondary | ICD-10-CM | POA: Insufficient documentation

## 2021-11-30 DIAGNOSIS — M6281 Muscle weakness (generalized): Secondary | ICD-10-CM | POA: Insufficient documentation

## 2021-11-30 DIAGNOSIS — R2681 Unsteadiness on feet: Secondary | ICD-10-CM | POA: Insufficient documentation

## 2021-11-30 DIAGNOSIS — R2689 Other abnormalities of gait and mobility: Secondary | ICD-10-CM | POA: Insufficient documentation

## 2021-11-30 NOTE — Therapy (Signed)
OUTPATIENT PHYSICAL THERAPY TREATMENT NOTE, PN, and RECERTIFICATION        Patient Name: [name] [name] MRN: [id number] DOB:[birth year], 61 y.o., male Today's Date: 11/30/2021  PCP: Scarlett Presto, MD REFERRING PROVIDER: Deitra Mayo, MD   PT End of Session - 11/30/21 1242     Visit Number 30    Number of Visits 73    Date for PT Re-Evaluation 12/30/21    Authorization Type UHC medicare with medicaid secondary so 10th visit progress note    Progress Note Due on Visit 59    PT Start Time 1234    PT Stop Time 1315    PT Time Calculation (min) 41 min    Equipment Utilized During Treatment Gait belt    Activity Tolerance Patient tolerated treatment well    Behavior During Therapy WFL for tasks assessed/performed             Past Medical History:  Diagnosis Date   Anemia    Anemia 08/06/2012   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   Hypertension    states is borderline on medication; has been on med. x 5-6 yr.   Hypokalemia 05/30/2018   Impingement syndrome of shoulder region 10/2014   left   Insomnia 06/01/2015   Ischemic ulcer of toe of left foot (HCC)    Left shoulder pain 12/16/2015   Low back pain without sciatica 10/29/2009   Lupus ([place])    Peripheral vascular disease (HCC)    Pneumonia    RA (rheumatoid arthritis) ([place])    Rotator cuff tear 11/04/2014   Past Surgical History:  Procedure Laterality Date   ABDOMINAL AORTAGRAM  02/20/2019   ABDOMINAL AORTOGRAM W/LOWER EXTREMITY N/A 02/20/2019   Procedure: ABDOMINAL AORTOGRAM W/LOWER EXTREMITY;  Surgeon: Marty Heck, MD;  Location: [facility];  Service: Cardiovascular;  Laterality: N/A;   ACHILLES TENDON SURGERY Bilateral    AMPUTATION Left 05/11/2020   Procedure: LEFT ABOVE KNEE AMPUTATION;  Surgeon:  Angelia Mould, MD;  Location: [place];  Service: Vascular;  Laterality: Left;   ENDARTERECTOMY POPLITEAL Left 05/01/2020   Procedure: ENDARTERECTOMY POPLITEAL;  Surgeon: Waynetta Sandy, MD;  Location: [place];  Service: Vascular;  Laterality: Left;   FASCIOTOMY Left 05/01/2020   Procedure: FASCIOTOMY;  Surgeon: Waynetta Sandy, MD;  Location: [place];  Service: Vascular;  Laterality: Left;   FEMORAL-POPLITEAL BYPASS GRAFT Left 02/26/2019   Procedure: BYPASS GRAFT FEMORAL-POPLITEAL ARTERY LEFT LEG USING 73m PROPATEN GRAFT;  Surgeon: BSerafina Mitchell MD;  [place] [facility]  Service: Vascular;  Laterality: Left;   INTRAOPERATIVE ARTERIOGRAM Left 01/22/2020   Procedure: INTRA OPERATIVE ARTERIOGRAM with administration of thrombolyics in Left femoral to popliteal bypass.;  Surgeon: CMarty Heck MD;  [place] [facility]  Service: Vascular;  Laterality: Left;   LEFT HEART CATH AND CORONARY ANGIOGRAPHY N/A 03/15/2017   Procedure: LEFT HEART CATH AND CORONARY ANGIOGRAPHY;  Surgeon: ENelva Bush MD;  [facility];  Service: Cardiovascular;  Laterality: N/A;   LOWER EXTREMITY INTERVENTION Left 04/29/2020   Procedure: LOWER EXTREMITY INTERVENTION- LYSIS;  Surgeon: CMarty Heck MD;  [facility];  Service: Cardiovascular;  Laterality: Left;   OSTEOTOMY AND ULNAR SHORTENING Right 07/22/2002  PATCH ANGIOPLASTY Left 05/01/2020   Procedure: [place];  Surgeon: Waynetta Sandy, MD;  Location: [place];  Service: Vascular;  Laterality: Left;   PERIPHERAL VASCULAR ATHERECTOMY  01/23/2020   Procedure: PERIPHERAL VASCULAR ATHERECTOMY;  Surgeon: Marty Heck, MD;  Location: [facility];  Service: Cardiovascular;;   PERIPHERAL VASCULAR BALLOON ANGIOPLASTY  01/23/2020   Procedure: PERIPHERAL VASCULAR BALLOON ANGIOPLASTY;  Surgeon: Marty Heck, MD;  Location: [facility];   Service: Cardiovascular;;   PERIPHERAL VASCULAR INTERVENTION Left 04/30/2020   Procedure: PERIPHERAL VASCULAR INTERVENTION;  Surgeon: Cherre Robins, MD;  Location: [facility];  Service: Cardiovascular;  Laterality: Left;   PERIPHERAL VASCULAR THROMBECTOMY N/A 01/23/2020   Procedure: lysis recheck;  Surgeon: Marty Heck, MD;  Location: [facility];  Service: Cardiovascular;  Laterality: N/A;  + [place]    PERIPHERAL VASCULAR THROMBECTOMY N/A 04/30/2020   Procedure: Lysis Recheck;  Surgeon: Cherre Robins, MD;  Location: [facility];  Service: Cardiovascular;  Laterality: N/A;   SHOULDER ARTHROSCOPY WITH BICEPSTENOTOMY Right 03/11/2015   Procedure: SHOULDER ARTHROSCOPY WITH BICEPSTENOTOMY;  Surgeon: Ninetta Lights, MD;  Location: [place];  Service: Orthopedics;  Laterality: Right;   SHOULDER ARTHROSCOPY WITH DISTAL CLAVICLE RESECTION Left 11/19/2014   Procedure: SHOULDER ARTHROSCOPY WITH DISTAL CLAVICLE RESECTION;  Surgeon: Kathryne Hitch, MD;  Location: [place];  Service: Orthopedics;  Laterality: Left;   SHOULDER ARTHROSCOPY WITH ROTATOR CUFF REPAIR AND SUBACROMIAL DECOMPRESSION Left 11/19/2014   Procedure: LEFT SHOULDER ARTHROSCOPY, DEBRIDEMENT DISTAL CLAVICLE EXCISION, ACROMIOPLASTY WITH ROTATOR CUFF REPAIR ;  Surgeon: Kathryne Hitch, MD;  Location: [place];  Service: Orthopedics;  Laterality: Left;   THROMBECTOMY OF BYPASS GRAFT FEMORAL- POPLITEAL ARTERY Left 05/01/2020   Procedure: THROMBECTOMY OF LOWER EXTREMITY;  Surgeon: Waynetta Sandy, MD;  Location: [place];  Service: Vascular;  Laterality: Left;   TRANSFORAMINAL LUMBAR INTERBODY FUSION (TLIF) WITH PEDICLE SCREW FIXATION 1 LEVEL N/A 01/03/2018   Procedure: TRANSFORAMINAL LUMBAR INTERBODY FUSION (TLIF) LUMBAR FIVE-SACRAL ONE;  Surgeon: Melina Schools, MD;  Location: [place];  Service: Orthopedics;  Laterality: N/A;   ULTRASOUND GUIDANCE FOR VASCULAR  ACCESS Right 01/22/2020   Procedure: ULTRASOUND GUIDANCE FOR VASCULAR ACCESS Right Common Femoral Artery.;  Surgeon: Marty Heck, MD;  Location: [place];  Service: Vascular;  Laterality: Right;   VIDEO ASSISTED THORACOSCOPY (VATS)/DECORTICATION Right 11/21/2011   drainage of empyema   Patient Active Problem List   Diagnosis Date Noted   Bite wound of left hand 09/27/2021   Arm pain 07/25/2021   Paronychia of great toe 01/28/2021   Onychomycosis 01/28/2021   Critical limb ischemia with history of revascularization of same extremity ([place]) 04/28/2020   History of COVID-19 05/10/2019   PAD (peripheral artery disease) ([place])    History of critical lower limb ischemia 02/19/2019   S/P lumbar fusion, Lower back pain 01/03/2018   Coronary artery disease involving native coronary artery of native heart with unstable angina pectoris ([place])    Preventative health care 03/13/2017   Transaminitis 12/16/2015   Bilateral shoulder pain 08/20/2012   Tobacco use disorder 08/20/2012   HTN (hypertension) 04/20/2011   HLD (hyperlipidemia) 10/08/2009   Rheumatoid arthritis ([place]) 08/11/2009    REFERRING DIAG: left AKA  THERAPY DIAG:  Other abnormalities of gait and mobility  Unsteadiness on feet  Muscle weakness (generalized)  Abnormal posture  PERTINENT HISTORY: RA, DM2, HTN, PAD, DDD,  s/p  multiple L LE revascularizations discharged from hospital a 05/05/20.  At that time he was deemed to not be a candidate for further revasc procedures.  Dc'd home with a known bypass occlusion with a plan for AKA if worsening pain. Returned to ED 05/08/20  for increased pain and seeking amputation. s/p L AKA 05/11/20   PRECAUTIONS: fall  SUBJECTIVE: Pt reports falling this morning while trying to walk on deck that was wet.  Reports back is a little sore.   PAIN:  Are you having pain? Yes NPRS scale:3-4/10 Pain location: shoulders Pain orientation:  PAIN TYPE: acute and chronic Pain description:  sore Aggravating factors: arthritis Relieving factors: heat and [place]     TODAY'S TREATMENT: 11/30/2021 PROSTHETICS: Prosthetic care comments: Currently wearing 8 ply socks. Back to using antiperspirant for sweat management.  Continue to educate on sweat management, it seems that secret is not working, so mentioned getting prescription from the MD or using sweat block.  Pt verbalized understanding.  Donning prosthesis: Modified independence and SBA Doffing prosthesis: Modified independence Prosthetic wear tolerance: 8-12 hours/day, 7 days/week Prosthetic weight bearing tolerance:  Residual limb condition: pt reports skin intact   GAIT: Gait pattern: step through pattern, decreased step length- Right, decreased stance time- Left, decreased stride length, trunk flexed, and narrow BOS Distance walked: 91' with RW, then 20' x 2 between tasks Assistive device utilized: Environmental consultant - 2 wheeled and prosthesis Level of assistance:  min guard assist Comments: cues for posture, to increased base of support and for prosthetic step placement. Continue to use locked out prosthesis with gait with pt able to self lock/unlock prosthetic knee. Did not have any IR noted in this session, lanyard tight throughout.    RAMP:  Level of Assistance: Min A Assistive device utilized: Walker - 2 wheeled Ramp Comments: Cues for sequencing and technique.   CURB:  Level of Assistance: Min A Assistive device utilized: Environmental consultant - 2 wheeled Curb Comments: Cues for sequencing and technique with RW  STAIRS:  Level of Assistance: Min A and Mod A  Stair Negotiation Technique: Step to Pattern with Bilateral Rails  Number of Stairs: 4   Height of Stairs: 6  Comments: Max cues for forward weight shift onto prosthesis when descending stairs     NMR:   Standing at counter top with prosthesis slightly in front of RLE, shifting forward onto LLE, then back to RLE x 10 reps with cues for posture and technique.  Pt did well  with task, verbally added for HEP.      PATIENT EDUCATION: Education details: continue with current HEP; Adding 4 more visits to continue to work on increasing gait at home, progress towards goals.  Person educated: Patient Education method: Explanation, Demonstration, and Verbal cues Education comprehension: verbalized understanding, returned demonstration, verbal cues required, and needs further education     HOME EXERCISE PROGRAM: [id number]    GOALS:  SHORT TERM GOALS:   Target date: 11/08/2021  Pt will tolerate wearing prosthesis for 8 hours/day consistently for improved mobility in home. Baseline: wearing 2-3 hours, 2x/day  Goal status: IN PROGRESS  2.  Pt will ambulate 250' on level surfaces inside consistently with RW mod I for improved mobility. Baseline: 250' 11/14/21 Goal status: MET  3.  Pt will be able to maintain standing without UE support/occasional touch performing functional tasks x 3 min for improved balance and standing ADLs. Baseline:3 mins with intermittent support at counter top while performing tasks.  Goal status: MET  LONG TERM GOALS:  Target date:  12/02/2021  Pt will be independent with progressive HEP for strengthening and balance to continue gains on own. Baseline: pt reports doing at home  Goal status: MET  2.  Pt will increase gait speed to >0.28ms for improved household mobility  Baseline: 11/30/21 0.24 m/s Goal status: NOT MET  3.  Pt will ambulate 100' with cane or crutch for improved short household distances. Baseline: currently needs RW Goal status: INITIAL  4.  Pt will ambulate up/down 4 steps with rails, up/down curb and ramp, mod I for improved community access with prosthesis locked out.  Baseline: Needs min A to negotiate stairs with B rails, curb and ramp also needing min A and max cues for safety and technique.  Goal status: NOT MET  5.  Pt will increase Berg from 30 to >34/56 for improved balance and decreased fall  risk. Baseline: 10/11/21 30/56 Goal status: INITIAL   UPDATED LONG TERM GOALS:  Target date: 12/30/2021  Pt will be independent with progressive HEP for strengthening and balance to continue gains on own. Baseline: pt reports doing at home  Goal status: Ongoing   2.  Pt will increase gait speed to >0.425m for improved household mobility  Baseline: 11/30/21 0.24 m/s Goal status: Ongoing    3.  Pt will ambulate up/down 4 steps with rails, up/down curb and ramp, S for improved community access with prosthesis locked out.  Baseline: Needs min A to negotiate stairs with B rails, curb and ramp also needing min A and max cues for safety and technique.  Goal status: Ongoing   4.  Pt will increase Berg from 30 to >/=34/56 for improved balance and decreased fall risk. Baseline: 10/11/21 30/56 Goal status: REVISED      Plan -     Clinical Impression Statement Skilled session focused on assessment of LTGs.  Pt has made little progress towards goals, however would like to have him schedule 4 more visits to hammer down a standing HEP to improve prosthetic weight bearing.  Pt verbalized understanding.  Recommended he see MD if back pain doesn't improve as it got very high at the end of our session.    Personal Factors and Comorbidities Comorbidity 3+;Time since onset of injury/illness/exacerbation    Comorbidities RA, DM2, HTN, PAD, DDD,  s/p multiple L LE revascularizations discharged from hospital a 05/05/20.  At that time he was deemed to not be a candidate for further revasc procedures.  Dc'd home with a known bypass occlusion with a plan for AKA if worsening pain. Returned to ED 05/08/20  for increased pain and seeking amputation. s/p L AKA 05/11/20. Pt reports 3 shoulder surgeries on each shoulder and low back surgery    Examination-Activity Limitations Bathing;Locomotion Level;Transfers;Stairs;Stand;Squat;Lift    Examination-Participation Restrictions Church;Meal Prep    Stability/Clinical  Decision Making Evolving/Moderate complexity    Rehab Potential Good    PT Frequency 2x / week for 4 weeks followed by 1x/week for 4 weeks   PT Duration 8 weeks    PT Treatment/Interventions ADLs/Self Care Home Management;DME Instruction;Gait training;Stair training;Functional mobility training;Therapeutic activities;Therapeutic exercise;Balance training;Neuromuscular re-education;Manual techniques;Passive range of motion;Prosthetic Training;Patient/family education;Vestibular    PT Next Visit Plan  Mainly working on updating HEP in standing to get him shifting onto prosthesis more.  Is his back better?? GAIT WITH PROSTHESIS LOCKED OUT RIGHT NOW as unable to lock on own, work on weight shifting over prosthesis. Continue to work on bringing pelvis forward over prosthesis.  .[name]  Prosthetic education. Gait in // bars with 1 UE support.    Consulted and Agree with Plan of Care Patient;             Cameron Sprang, PT, MPT [facility] [address] [place], [place], [address] Phone: [phone]   Fax:  [phone] 11/30/21, 3:36 PM

## 2021-12-09 ENCOUNTER — Ambulatory Visit: Payer: Medicare Other

## 2021-12-09 DIAGNOSIS — R293 Abnormal posture: Secondary | ICD-10-CM | POA: Diagnosis not present

## 2021-12-09 DIAGNOSIS — R2681 Unsteadiness on feet: Secondary | ICD-10-CM | POA: Diagnosis not present

## 2021-12-09 DIAGNOSIS — M6281 Muscle weakness (generalized): Secondary | ICD-10-CM | POA: Diagnosis not present

## 2021-12-09 DIAGNOSIS — R2689 Other abnormalities of gait and mobility: Secondary | ICD-10-CM

## 2021-12-09 NOTE — Therapy (Signed)
OUTPATIENT PHYSICAL THERAPY TREATMENT NOTE, PN, and RECERTIFICATION        Patient Name: [name] [name] MRN: [id number] DOB:05-23-1961, 61 y.o., male Today's Date: 12/09/2021  PCP: Ilene Qua, MD REFERRING PROVIDER: Waverly Ferrari, MD   PT End of Session - 12/09/21 1359     Visit Number 31    Number of Visits 35    Date for PT Re-Evaluation 12/30/21    Authorization Type [employer] medicare with medicaid secondary so 10th visit progress note    Progress Note Due on Visit 30    PT Start Time 1400    PT Stop Time 1445    PT Time Calculation (min) 45 min    Equipment Utilized During Treatment Gait belt    Activity Tolerance Patient tolerated treatment well    Behavior During Therapy WFL for tasks assessed/performed             Past Medical History:  Diagnosis Date   Anemia    Anemia 08/06/2012   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   Hypertension    states is borderline on medication; has been on med. x 5-6 yr.   Hypokalemia 05/30/2018   Impingement syndrome of shoulder region 10/2014   left   Insomnia 06/01/2015   Ischemic ulcer of toe of left foot (HCC)    Left shoulder pain 12/16/2015   Low back pain without sciatica 10/29/2009   Lupus (HCC)    Peripheral vascular disease (HCC)    Pneumonia    RA (rheumatoid arthritis) (HCC)    Rotator cuff tear 11/04/2014   Past Surgical History:  Procedure Laterality Date   ABDOMINAL AORTAGRAM  02/20/2019   ABDOMINAL AORTOGRAM W/LOWER EXTREMITY N/A 02/20/2019   Procedure: ABDOMINAL AORTOGRAM W/LOWER EXTREMITY;  Surgeon: Cephus Shelling, MD;  Location: [facility];  Service: Cardiovascular;  Laterality: N/A;   ACHILLES TENDON SURGERY Bilateral    AMPUTATION Left 05/11/2020   Procedure: LEFT ABOVE KNEE AMPUTATION;  Surgeon:  Chuck Hint, MD;  Location: [facility] OR;  Service: Vascular;  Laterality: Left;   ENDARTERECTOMY POPLITEAL Left 05/01/2020   Procedure: ENDARTERECTOMY POPLITEAL;  Surgeon: Maeola Harman, MD;  Location: [facility] OR;  Service: Vascular;  Laterality: Left;   FASCIOTOMY Left 05/01/2020   Procedure: FASCIOTOMY;  Surgeon: Maeola Harman, MD;  Location: [facility] OR;  Service: Vascular;  Laterality: Left;   FEMORAL-POPLITEAL BYPASS GRAFT Left 02/26/2019   Procedure: BYPASS GRAFT FEMORAL-POPLITEAL ARTERY LEFT LEG USING 7mm PROPATEN GRAFT;  Surgeon: Nada Libman, MD;  Location: [facility] OR;  Service: Vascular;  Laterality: Left;   INTRAOPERATIVE ARTERIOGRAM Left 01/22/2020   Procedure: INTRA OPERATIVE ARTERIOGRAM with administration of thrombolyics in Left femoral to popliteal bypass.;  Surgeon: Cephus Shelling, MD;  Location: [facility] OR;  Service: Vascular;  Laterality: Left;   LEFT HEART CATH AND CORONARY ANGIOGRAPHY N/A 03/15/2017   Procedure: LEFT HEART CATH AND CORONARY ANGIOGRAPHY;  Surgeon: Yvonne Kendall, MD;  Location: [facility];  Service: Cardiovascular;  Laterality: N/A;   LOWER EXTREMITY INTERVENTION Left 04/29/2020   Procedure: LOWER EXTREMITY INTERVENTION- LYSIS;  Surgeon: Cephus Shelling, MD;  Location: [facility];  Service: Cardiovascular;  Laterality: Left;   OSTEOTOMY AND ULNAR SHORTENING Right 07/22/2002  PATCH ANGIOPLASTY Left 05/01/2020   Procedure: [place];  Surgeon: Waynetta Sandy, MD;  Location: [place];  Service: Vascular;  Laterality: Left;   PERIPHERAL VASCULAR ATHERECTOMY  01/23/2020   Procedure: PERIPHERAL VASCULAR ATHERECTOMY;  Surgeon: Marty Heck, MD;  Location: [place] [facility];  Service: Cardiovascular;;   PERIPHERAL VASCULAR BALLOON ANGIOPLASTY  01/23/2020   Procedure: PERIPHERAL VASCULAR BALLOON ANGIOPLASTY;  Surgeon: Marty Heck, MD;  Location: [place] [facility];   Service: Cardiovascular;;   PERIPHERAL VASCULAR INTERVENTION Left 04/30/2020   Procedure: PERIPHERAL VASCULAR INTERVENTION;  Surgeon: Cherre Robins, MD;  Location: [place] [facility];  Service: Cardiovascular;  Laterality: Left;   PERIPHERAL VASCULAR THROMBECTOMY N/A 01/23/2020   Procedure: lysis recheck;  Surgeon: Marty Heck, MD;  Location: [place] [facility];  Service: Cardiovascular;  Laterality: N/A;  + [place]    PERIPHERAL VASCULAR THROMBECTOMY N/A 04/30/2020   Procedure: Lysis Recheck;  Surgeon: Cherre Robins, MD;  Location: [place] [facility];  Service: Cardiovascular;  Laterality: N/A;   SHOULDER ARTHROSCOPY WITH BICEPSTENOTOMY Right 03/11/2015   Procedure: SHOULDER ARTHROSCOPY WITH BICEPSTENOTOMY;  Surgeon: Ninetta Lights, MD;  Location: [place];  Service: Orthopedics;  Laterality: Right;   SHOULDER ARTHROSCOPY WITH DISTAL CLAVICLE RESECTION Left 11/19/2014   Procedure: SHOULDER ARTHROSCOPY WITH DISTAL CLAVICLE RESECTION;  Surgeon: Kathryne Hitch, MD;  Location: [place];  Service: Orthopedics;  Laterality: Left;   SHOULDER ARTHROSCOPY WITH ROTATOR CUFF REPAIR AND SUBACROMIAL DECOMPRESSION Left 11/19/2014   Procedure: LEFT SHOULDER ARTHROSCOPY, DEBRIDEMENT DISTAL CLAVICLE EXCISION, ACROMIOPLASTY WITH ROTATOR CUFF REPAIR ;  Surgeon: Kathryne Hitch, MD;  Location: [place];  Service: Orthopedics;  Laterality: Left;   THROMBECTOMY OF BYPASS GRAFT FEMORAL- POPLITEAL ARTERY Left 05/01/2020   Procedure: THROMBECTOMY OF LOWER EXTREMITY;  Surgeon: Waynetta Sandy, MD;  Location: [place];  Service: Vascular;  Laterality: Left;   TRANSFORAMINAL LUMBAR INTERBODY FUSION (TLIF) WITH PEDICLE SCREW FIXATION 1 LEVEL N/A 01/03/2018   Procedure: TRANSFORAMINAL LUMBAR INTERBODY FUSION (TLIF) LUMBAR FIVE-SACRAL ONE;  Surgeon: Melina Schools, MD;  Location: [place];  Service: Orthopedics;  Laterality: N/A;   ULTRASOUND GUIDANCE FOR VASCULAR  ACCESS Right 01/22/2020   Procedure: ULTRASOUND GUIDANCE FOR VASCULAR ACCESS Right Common Femoral Artery.;  Surgeon: Marty Heck, MD;  Location: [place];  Service: Vascular;  Laterality: Right;   VIDEO ASSISTED THORACOSCOPY (VATS)/DECORTICATION Right 11/21/2011   drainage of empyema   Patient Active Problem List   Diagnosis Date Noted   Bite wound of left hand 09/27/2021   Arm pain 07/25/2021   Paronychia of great toe 01/28/2021   Onychomycosis 01/28/2021   Critical limb ischemia with history of revascularization of same extremity ([place]) 04/28/2020   History of COVID-19 05/10/2019   PAD (peripheral artery disease) ([place])    History of critical lower limb ischemia 02/19/2019   S/P lumbar fusion, Lower back pain 01/03/2018   Coronary artery disease involving native coronary artery of native heart with unstable angina pectoris ([place])    Preventative health care 03/13/2017   Transaminitis 12/16/2015   Bilateral shoulder pain 08/20/2012   Tobacco use disorder 08/20/2012   HTN (hypertension) 04/20/2011   HLD (hyperlipidemia) 10/08/2009   Rheumatoid arthritis ([place]) 08/11/2009    REFERRING DIAG: left AKA  THERAPY DIAG:  Other abnormalities of gait and mobility  Unsteadiness on feet  Muscle weakness (generalized)  Abnormal posture  PERTINENT HISTORY: RA, DM2, HTN, PAD, DDD,  s/p  multiple L LE revascularizations discharged from hospital a 05/05/20.  At that time he was deemed to not be a candidate for further revasc procedures.  Dc'd home with a known bypass occlusion with a plan for AKA if worsening pain. Returned to ED 05/08/20  for increased pain and seeking amputation. s/p L AKA 05/11/20   PRECAUTIONS: fall  SUBJECTIVE: Pt reports falling this morning while trying to walk on deck that was wet.  Reports back is a little sore.   PAIN:  Are you having pain? Yes NPRS scale:3-4/10 Pain location: shoulders Pain orientation:  PAIN TYPE: acute and chronic Pain description:  sore Aggravating factors: arthritis Relieving factors: heat and [place]     TODAY'S TREATMENT:  Gait training: 1 x 115' noted shorter leg length on L leg, removed R heel lift and walked around, cues for longer step length Pt only had 5 ply sock on and he was reporting pressure at bottom of residual leg Added 3 ply sock (clinic sock) and pt reported improved pain at bottom of residual leg and improving leg length discrepancy Pt educate don proper sock ply management. Educated that he may need 1-2 more ply for proper fit in socket, comfort and leg length 1 x 115' with 8 ply sock, cues for improved R step length and getting tall during L stance phase to improve step length on R LE Standing in place with walker and stepping forward with R LE onto 2" box in front: tactile cues at pelvis to shift left and anterior during L stance phase to improve weight shifting to L leg: mirror in front and cues to stand up tall and look ahead in mirror for reference: 5 x 10 L only Gait training: 1 x80' to practice shifting weight to L and anterior during L stance phase with tactile cues- pt demo improve step length and decreased WB through UE      PATIENT EDUCATION: Education details: continue with current HEP; Adding 4 more visits to continue to work on increasing gait at home, progress towards goals.  Person educated: Patient Education method: Explanation, Demonstration, and Verbal cues Education comprehension: verbalized understanding, returned demonstration, verbal cues required, and needs further education     HOME EXERCISE PROGRAM: [id number]    GOALS:  SHORT TERM GOALS:   Target date: 11/08/2021  Pt will tolerate wearing prosthesis for 8 hours/day consistently for improved mobility in home. Baseline: wearing 2-3 hours, 2x/day  Goal status: IN PROGRESS  2.  Pt will ambulate 250' on level surfaces inside consistently with RW mod I for improved mobility. Baseline: 250' 11/14/21 Goal status:  MET  3.  Pt will be able to maintain standing without UE support/occasional touch performing functional tasks x 3 min for improved balance and standing ADLs. Baseline:3 mins with intermittent support at counter top while performing tasks.  Goal status: MET    LONG TERM GOALS:  Target date:  12/02/2021  Pt will be independent with progressive HEP for strengthening and balance to continue gains on own. Baseline: pt reports doing at home  Goal status: MET  2.  Pt will increase gait speed to >0.71m/s for improved household mobility  Baseline: 11/30/21 0.24 m/s Goal status: NOT MET  3.  Pt will ambulate 100' with cane or crutch for improved short household distances. Baseline: currently needs RW Goal status: INITIAL  4.  Pt will ambulate up/down 4 steps with rails, up/down curb and ramp, mod I for improved community access with prosthesis locked out.  Baseline:  Needs min A to negotiate stairs with B rails, curb and ramp also needing min A and max cues for safety and technique.  Goal status: NOT MET  5.  Pt will increase Berg from 30 to >34/56 for improved balance and decreased fall risk. Baseline: 10/11/21 30/56 Goal status: INITIAL   UPDATED LONG TERM GOALS:  Target date: 12/30/2021  Pt will be independent with progressive HEP for strengthening and balance to continue gains on own. Baseline: pt reports doing at home  Goal status: Ongoing   2.  Pt will increase gait speed to >0.76m/s for improved household mobility  Baseline: 11/30/21 0.24 m/s Goal status: Ongoing    3.  Pt will ambulate up/down 4 steps with rails, up/down curb and ramp, S for improved community access with prosthesis locked out.  Baseline: Needs min A to negotiate stairs with B rails, curb and ramp also needing min A and max cues for safety and technique.  Goal status: Ongoing   4.  Pt will increase Berg from 30 to >/=34/56 for improved balance and decreased fall risk. Baseline: 10/11/21 30/56 Goal status: REVISED       Plan -     Clinical Impression Statement Today's sesison was focused on improving sock management, improving disocmofrt in L residual leg, energy conservation with walking and improving weight shift on L LE during stance phase.    Personal Factors and Comorbidities Comorbidity 3+;Time since onset of injury/illness/exacerbation    Comorbidities RA, DM2, HTN, PAD, DDD,  s/p multiple L LE revascularizations discharged from hospital a 05/05/20.  At that time he was deemed to not be a candidate for further revasc procedures.  Dc'd home with a known bypass occlusion with a plan for AKA if worsening pain. Returned to ED 05/08/20  for increased pain and seeking amputation. s/p L AKA 05/11/20. Pt reports 3 shoulder surgeries on each shoulder and low back surgery    Examination-Activity Limitations Bathing;Locomotion Level;Transfers;Stairs;Stand;Squat;Lift    Examination-Participation Restrictions Church;Meal Prep    Stability/Clinical Decision Making Evolving/Moderate complexity    Rehab Potential Good    PT Frequency 2x / week for 4 weeks followed by 1x/week for 4 weeks   PT Duration 8 weeks    PT Treatment/Interventions ADLs/Self Care Home Management;DME Instruction;Gait training;Stair training;Functional mobility training;Therapeutic activities;Therapeutic exercise;Balance training;Neuromuscular re-education;Manual techniques;Passive range of motion;Prosthetic Training;Patient/family education;Vestibular    PT Next Visit Plan  Mainly working on updating HEP in standing to get him shifting onto prosthesis more.  Is his back better?? GAIT WITH PROSTHESIS LOCKED OUT RIGHT NOW as unable to lock on own, work on weight shifting over prosthesis. Continue to work on bringing pelvis forward over prosthesis.  .  Prosthetic education. Gait in // bars with 1 UE support.    Consulted and Agree with Plan of Care Patient;             Kerrie Pleasure, PT 12/09/2021, 2:00 PM

## 2021-12-16 ENCOUNTER — Ambulatory Visit: Payer: Medicare Other | Admitting: Physical Therapy

## 2021-12-17 ENCOUNTER — Encounter: Payer: Self-pay | Admitting: *Deleted

## 2021-12-23 ENCOUNTER — Ambulatory Visit: Payer: Medicare Other

## 2021-12-26 DIAGNOSIS — M5136 Other intervertebral disc degeneration, lumbar region: Secondary | ICD-10-CM | POA: Diagnosis not present

## 2021-12-26 DIAGNOSIS — M4326 Fusion of spine, lumbar region: Secondary | ICD-10-CM | POA: Diagnosis not present

## 2021-12-26 DIAGNOSIS — G894 Chronic pain syndrome: Secondary | ICD-10-CM | POA: Diagnosis not present

## 2021-12-26 DIAGNOSIS — M069 Rheumatoid arthritis, unspecified: Secondary | ICD-10-CM | POA: Diagnosis not present

## 2021-12-30 ENCOUNTER — Ambulatory Visit: Payer: Medicare Other | Attending: Vascular Surgery

## 2021-12-30 DIAGNOSIS — R293 Abnormal posture: Secondary | ICD-10-CM | POA: Diagnosis not present

## 2021-12-30 DIAGNOSIS — R2681 Unsteadiness on feet: Secondary | ICD-10-CM | POA: Insufficient documentation

## 2021-12-30 DIAGNOSIS — M25571 Pain in right ankle and joints of right foot: Secondary | ICD-10-CM | POA: Insufficient documentation

## 2021-12-30 DIAGNOSIS — R2689 Other abnormalities of gait and mobility: Secondary | ICD-10-CM | POA: Insufficient documentation

## 2021-12-30 DIAGNOSIS — M6281 Muscle weakness (generalized): Secondary | ICD-10-CM | POA: Diagnosis not present

## 2021-12-30 NOTE — Therapy (Signed)
OUTPATIENT PHYSICAL THERAPY TREATMENT NOTE, PN, and RECERTIFICATION        Patient Name: [name] [name] MRN: [id number] DOB:1960-10-27, 61 y.o., male Today's Date: 12/30/2021  PCP: Angelique Blonder, DO REFERRING PROVIDER: Deitra Mayo, MD   PT End of Session - 12/30/21 1326     Visit Number 32    Number of Visits 36    Date for PT Re-Evaluation 02/24/22    Authorization Type [employer] medicare with medicaid secondary so 10th visit progress note    Progress Note Due on Visit 44    PT Start Time 1315    PT Stop Time 1355    PT Time Calculation (min) 40 min    Equipment Utilized During Treatment Gait belt    Activity Tolerance Patient tolerated treatment well    Behavior During Therapy WFL for tasks assessed/performed             Past Medical History:  Diagnosis Date   Anemia    Anemia 08/06/2012   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   Hypertension    states is borderline on medication; has been on med. x 5-6 yr.   Hypokalemia 05/30/2018   Impingement syndrome of shoulder region 10/2014   left   Insomnia 06/01/2015   Ischemic ulcer of toe of left foot (HCC)    Left shoulder pain 12/16/2015   Low back pain without sciatica 10/29/2009   Lupus ([place])    Peripheral vascular disease (HCC)    Pneumonia    RA (rheumatoid arthritis) ([place])    Rotator cuff tear 11/04/2014   Past Surgical History:  Procedure Laterality Date   ABDOMINAL AORTAGRAM  02/20/2019   ABDOMINAL AORTOGRAM W/LOWER EXTREMITY N/A 02/20/2019   Procedure: ABDOMINAL AORTOGRAM W/LOWER EXTREMITY;  Surgeon: Marty Heck, MD;  Location: [facility];  Service: Cardiovascular;  Laterality: N/A;   ACHILLES TENDON SURGERY Bilateral    AMPUTATION Left 05/11/2020   Procedure: LEFT ABOVE KNEE AMPUTATION;  Surgeon:  Angelia Mould, MD;  Location: [facility];  Service: Vascular;  Laterality: Left;   ENDARTERECTOMY POPLITEAL Left 05/01/2020   Procedure: ENDARTERECTOMY POPLITEAL;  Surgeon: Waynetta Sandy, MD;  Location: [facility];  Service: Vascular;  Laterality: Left;   FASCIOTOMY Left 05/01/2020   Procedure: FASCIOTOMY;  Surgeon: Waynetta Sandy, MD;  Location: [facility];  Service: Vascular;  Laterality: Left;   FEMORAL-POPLITEAL BYPASS GRAFT Left 02/26/2019   Procedure: BYPASS GRAFT FEMORAL-POPLITEAL ARTERY LEFT LEG USING 85m PROPATEN GRAFT;  Surgeon: BSerafina Mitchell MD;  [place] [facility]  Service: Vascular;  Laterality: Left;   INTRAOPERATIVE ARTERIOGRAM Left 01/22/2020   Procedure: INTRA OPERATIVE ARTERIOGRAM with administration of thrombolyics in Left femoral to popliteal bypass.;  Surgeon: CMarty Heck MD;  [facility]  Service: Vascular;  Laterality: Left;   LEFT HEART CATH AND CORONARY ANGIOGRAPHY N/A 03/15/2017   Procedure: LEFT HEART CATH AND CORONARY ANGIOGRAPHY;  Surgeon: ENelva Bush MD;  [facility];  Service: Cardiovascular;  Laterality: N/A;   LOWER EXTREMITY INTERVENTION Left 04/29/2020   Procedure: LOWER EXTREMITY INTERVENTION- LYSIS;  Surgeon: CMarty Heck MD;  [facility];  Service: Cardiovascular;  Laterality: Left;   OSTEOTOMY AND ULNAR SHORTENING Right 07/22/2002  PATCH ANGIOPLASTY Left 05/01/2020   Procedure: [place];  Surgeon: Waynetta Sandy, MD;  Location: [facility];  Service: Vascular;  Laterality: Left;   PERIPHERAL VASCULAR ATHERECTOMY  01/23/2020   Procedure: PERIPHERAL VASCULAR ATHERECTOMY;  Surgeon: Marty Heck, MD;  Location: [facility];  Service: Cardiovascular;;   PERIPHERAL VASCULAR BALLOON ANGIOPLASTY  01/23/2020   Procedure: PERIPHERAL VASCULAR BALLOON ANGIOPLASTY;  Surgeon: Marty Heck, MD;  Location: [facility];   Service: Cardiovascular;;   PERIPHERAL VASCULAR INTERVENTION Left 04/30/2020   Procedure: PERIPHERAL VASCULAR INTERVENTION;  Surgeon: Cherre Robins, MD;  Location: [facility];  Service: Cardiovascular;  Laterality: Left;   PERIPHERAL VASCULAR THROMBECTOMY N/A 01/23/2020   Procedure: lysis recheck;  Surgeon: Marty Heck, MD;  Location: [facility];  Service: Cardiovascular;  Laterality: N/A;  + [place]    PERIPHERAL VASCULAR THROMBECTOMY N/A 04/30/2020   Procedure: Lysis Recheck;  Surgeon: Cherre Robins, MD;  Location: [facility];  Service: Cardiovascular;  Laterality: N/A;   SHOULDER ARTHROSCOPY WITH BICEPSTENOTOMY Right 03/11/2015   Procedure: SHOULDER ARTHROSCOPY WITH BICEPSTENOTOMY;  Surgeon: Ninetta Lights, MD;  Location: [place] [facility];  Service: Orthopedics;  Laterality: Right;   SHOULDER ARTHROSCOPY WITH DISTAL CLAVICLE RESECTION Left 11/19/2014   Procedure: SHOULDER ARTHROSCOPY WITH DISTAL CLAVICLE RESECTION;  Surgeon: Kathryne Hitch, MD;  Location: [facility];  Service: Orthopedics;  Laterality: Left;   SHOULDER ARTHROSCOPY WITH ROTATOR CUFF REPAIR AND SUBACROMIAL DECOMPRESSION Left 11/19/2014   Procedure: LEFT SHOULDER ARTHROSCOPY, DEBRIDEMENT DISTAL CLAVICLE EXCISION, ACROMIOPLASTY WITH ROTATOR CUFF REPAIR ;  Surgeon: Kathryne Hitch, MD;  Location: [facility];  Service: Orthopedics;  Laterality: Left;   THROMBECTOMY OF BYPASS GRAFT FEMORAL- POPLITEAL ARTERY Left 05/01/2020   Procedure: THROMBECTOMY OF LOWER EXTREMITY;  Surgeon: Waynetta Sandy, MD;  Location: [facility];  Service: Vascular;  Laterality: Left;   TRANSFORAMINAL LUMBAR INTERBODY FUSION (TLIF) WITH PEDICLE SCREW FIXATION 1 LEVEL N/A 01/03/2018   Procedure: TRANSFORAMINAL LUMBAR INTERBODY FUSION (TLIF) LUMBAR FIVE-SACRAL ONE;  Surgeon: Melina Schools, MD;  Location: [facility];  Service: Orthopedics;  Laterality: N/A;   ULTRASOUND GUIDANCE FOR VASCULAR  ACCESS Right 01/22/2020   Procedure: ULTRASOUND GUIDANCE FOR VASCULAR ACCESS Right Common Femoral Artery.;  Surgeon: Marty Heck, MD;  Location: [facility];  Service: Vascular;  Laterality: Right;   VIDEO ASSISTED THORACOSCOPY (VATS)/DECORTICATION Right 11/21/2011   drainage of empyema   Patient Active Problem List   Diagnosis Date Noted   Bite wound of left hand 09/27/2021   Arm pain 07/25/2021   Paronychia of great toe 01/28/2021   Onychomycosis 01/28/2021   Critical limb ischemia with history of revascularization of same extremity ([place]) 04/28/2020   History of COVID-19 05/10/2019   PAD (peripheral artery disease) ([place])    History of critical lower limb ischemia 02/19/2019   S/P lumbar fusion, Lower back pain 01/03/2018   Coronary artery disease involving native coronary artery of native heart with unstable angina pectoris ([place])    Preventative health care 03/13/2017   Transaminitis 12/16/2015   Bilateral shoulder pain 08/20/2012   Tobacco use disorder 08/20/2012   HTN (hypertension) 04/20/2011   HLD (hyperlipidemia) 10/08/2009   Rheumatoid arthritis ([place]) 08/11/2009    REFERRING DIAG: left AKA  THERAPY DIAG:  Other abnormalities of gait and mobility  Unsteadiness on feet  Muscle weakness (generalized)  Abnormal posture  Pain in right ankle and joints of right foot  PERTINENT HISTORY: RA, DM2, HTN, PAD, DDD,  s/p multiple L LE revascularizations discharged from hospital a 05/05/20.  At that time he was deemed to not be a candidate for further revasc procedures.  Dc'd home with a known bypass occlusion with a plan for AKA if worsening pain. Returned to ED 05/08/20  for increased pain and seeking amputation. s/p L AKA 05/11/20   PRECAUTIONS: fall  SUBJECTIVE: Pt reprots that he sprained his ankle about 10 days ago while walking on gravel to get in his sister's car. It was really swollen before but now swelling has gone down a bit. But I am not walking as good as  before.  PAIN:  Are you having pain? Yes NPRS scale:7-8/10 Pain location: R ankle Pain orientation:  PAIN TYPE: acute Pain description: sharp Aggravating factors: arthritis Relieving factors: heat and [place]     TODAY'S TREATMENT: LOWER EXTREMITY ROM:  Active/passive ROM Right eval Left eval  Hip flexion    Hip extension    Hip abduction    Hip adduction    Hip internal rotation    Hip external rotation    Knee flexion    Knee extension    Ankle dorsiflexion -5/15   Ankle plantarflexion    Ankle inversion 5/30   Ankle eversion 15/25    (Blank rows = not tested)  LOWER EXTREMITY MMT:  MMT Right eval Left eval  Hip flexion    Hip extension    Hip abduction    Hip adduction    Hip internal rotation    Hip external rotation    Knee flexion    Knee extension    Ankle dorsiflexion 5/5 pain   Ankle plantarflexion    Ankle inversion 4/5   Ankle eversion 4/5 pain    (Blank rows = not tested)  Palpation: pt is tender over posterior talofibular ligament on R ankle with pain with end range ankle plantarflexion and inversion. There is no black/blue coloring of ankle, ankle has non pitting edema mildly on lateral aspect   Ankle AROM in all 4 directions: 20x with cues to get to end range stretching in all directions, pt educated on doing this for his HEP 30 reps, 3-5x/day. We discussed icing as necessary for 15-20 minutes.   Pt educated on seeing the MD for further evaluation for possible X-ray to rule out any fractures. Pt edcuated on getting an ankle brace to provide some support with walking   We trialed ankle stir up brace with 1 x 115' o walking with RW, pt initially reported 3-4/10 pain with ankle brace but by end of the walking pt increased to 7-8/10.     PATIENT EDUCATION: Education details: continue with current HEP; Adding 4 more visits to continue to work on increasing gait at home, progress towards goals.  Person educated: Patient Education method:  Explanation, Demonstration, and Verbal cues Education comprehension: verbalized understanding, returned demonstration, verbal cues required, and needs further education     HOME EXERCISE PROGRAM: [id number]    GOALS:  SHORT TERM GOALS:   Target date: 11/08/2021  Pt will tolerate wearing prosthesis for 8 hours/day consistently for improved mobility in home. Baseline: wearing 2-3 hours, 2x/day  Goal status: IN PROGRESS  2.  Pt will ambulate 250' on level surfaces inside consistently with RW mod I for improved mobility. Baseline: 250' 11/14/21 Goal status: MET  3.  Pt will be able to maintain standing without UE support/occasional touch performing functional tasks x 3 min for improved  balance and standing ADLs. Baseline:3 mins with intermittent support at counter top while performing tasks.  Goal status: MET      UPDATED LONG TERM GOALS:  Target date: 02/24/2022    Pt will be independent with progressive HEP for strengthening and balance to continue gains on own. Baseline: pt reports doing at home  Goal status: Ongoing   2.  Pt will increase gait speed to >0.45ms for improved household mobility  Baseline: 11/30/21 0.24 m/s Goal status: Ongoing    3.  Pt will ambulate up/down 4 steps with rails, up/down curb and ramp, S for improved community access with prosthesis locked out.  Baseline: Needs min A to negotiate stairs with B rails, curb and ramp also needing min A and max cues for safety and technique.  Goal status: Ongoing   4.  Pt will increase Berg from 30 to >/=34/56 for improved balance and decreased fall risk. Baseline: 10/11/21 30/56 Goal status: Progressing  5. Pt will demo at least 10 deg of L ankle AROM with dorsiflexion to improve ankle mobility with walking.  Baseline: -5 deg 12/30/21  Gaol status: revised     Plan -     Clinical Impression Statement Pt has been seen for total of 32 sessions for gait training with prosthesis after L AKA. Today was supposed to  be his recertification day but unfortunately, pt had injured his ankle 10 days ago which significantly has affected his walking. We were unable to reassess his LTG due to recent injury. Pt was educated to consult MD for further evaluation of R ankle injury to assure there is no fractures. Pt was given basic AROM exercises for ankle to maintain ankle mobility and manage pain and was recommended to wear ankle brace to stabilize ankle with walking and transfers. Pt will continue to benefit from skilled PT to improve his ankle ROM and pain, gait training and improve overall function.   Personal Factors and Comorbidities Comorbidity 3+;Time since onset of injury/illness/exacerbation    Comorbidities RA, DM2, HTN, PAD, DDD,  s/p multiple L LE revascularizations discharged from hospital a 05/05/20.  At that time he was deemed to not be a candidate for further revasc procedures.  Dc'd home with a known bypass occlusion with a plan for AKA if worsening pain. Returned to ED 05/08/20  for increased pain and seeking amputation. s/p L AKA 05/11/20. Pt reports 3 shoulder surgeries on each shoulder and low back surgery    Examination-Activity Limitations Bathing;Locomotion Level;Transfers;Stairs;Stand;Squat;Lift    Examination-Participation Restrictions Church;Meal Prep    Stability/Clinical Decision Making Evolving/Moderate complexity    Rehab Potential Good    PT Frequency 1x/week   PT Duration 8 weeks    PT Treatment/Interventions ADLs/Self Care Home Management;DME Instruction;Gait training;Stair training;Functional mobility training;Therapeutic activities;Therapeutic exercise;Balance training;Neuromuscular re-education;Manual techniques;Passive range of motion;Prosthetic Training;Patient/family education;Vestibular , manual therapy, joint mobilization   PT Next Visit Plan  Recert done on 73/7/85 change the header to treatment note, work on R ankle ROM exercises, continue to work on gait training with increased R step  length and stair training   Consulted and Agree with Plan of Care Patient;             KKerrie Pleasure PT 12/30/2021, 2:10 PM

## 2022-01-06 ENCOUNTER — Ambulatory Visit: Payer: Medicare Other

## 2022-01-10 ENCOUNTER — Emergency Department (HOSPITAL_COMMUNITY)
Admission: EM | Admit: 2022-01-10 | Discharge: 2022-01-10 | Disposition: A | Discharge status: Home / Self Care | Payer: Medicare Other | Attending: Emergency Medicine | Admitting: Emergency Medicine

## 2022-01-10 ENCOUNTER — Encounter (HOSPITAL_COMMUNITY): Payer: Self-pay | Admitting: Pharmacy Technician

## 2022-01-10 ENCOUNTER — Emergency Department (HOSPITAL_BASED_OUTPATIENT_CLINIC_OR_DEPARTMENT_OTHER): Payer: Medicare Other

## 2022-01-10 ENCOUNTER — Other Ambulatory Visit: Payer: Self-pay

## 2022-01-10 DIAGNOSIS — M79661 Pain in right lower leg: Secondary | ICD-10-CM | POA: Diagnosis present

## 2022-01-10 DIAGNOSIS — M7989 Other specified soft tissue disorders: Secondary | ICD-10-CM

## 2022-01-10 DIAGNOSIS — I2510 Atherosclerotic heart disease of native coronary artery without angina pectoris: Secondary | ICD-10-CM | POA: Insufficient documentation

## 2022-01-10 DIAGNOSIS — I82431 Acute embolism and thrombosis of right popliteal vein: Secondary | ICD-10-CM | POA: Diagnosis not present

## 2022-01-10 DIAGNOSIS — F17210 Nicotine dependence, cigarettes, uncomplicated: Secondary | ICD-10-CM | POA: Insufficient documentation

## 2022-01-10 DIAGNOSIS — I82451 Acute embolism and thrombosis of right peroneal vein: Secondary | ICD-10-CM | POA: Diagnosis not present

## 2022-01-10 DIAGNOSIS — R52 Pain, unspecified: Secondary | ICD-10-CM | POA: Diagnosis not present

## 2022-01-10 LAB — BASIC METABOLIC PANEL
Anion gap: 9 (ref 5–15)
BUN: 15 mg/dL (ref 8–23)
CO2: 23 mmol/L (ref 22–32)
Calcium: 9.2 mg/dL (ref 8.9–10.3)
Chloride: 108 mmol/L (ref 98–111)
Creatinine, Ser: 0.76 mg/dL (ref 0.61–1.24)
GFR, Estimated: >60 mL/min (ref >60)
Glucose, Bld: 84 mg/dL (ref 70–99)
Potassium: 4 mmol/L (ref 3.5–5.1)
Sodium: 140 mmol/L (ref 135–145)

## 2022-01-10 LAB — CBC
HCT: 41.4 % (ref 39.0–52.0)
Hemoglobin: 13 g/dL (ref 13.0–17.0)
MCH: 25.2 pg — ABNORMAL LOW (ref 26.0–34.0)
MCHC: 31.4 g/dL (ref 30.0–36.0)
MCV: 80.2 fL (ref 80.0–100.0)
Platelets: 151 10*3/uL (ref 150–400)
RBC: 5.16 MIL/uL (ref 4.22–5.81)
RDW: 15.8 % — ABNORMAL HIGH (ref 11.5–15.5)
WBC: 7.7 10*3/uL (ref 4.0–10.5)
nRBC: 0 % (ref 0.0–0.2)

## 2022-01-10 MED ORDER — APIXABAN (ELIQUIS) VTE STARTER PACK (10MG AND 5MG): ORAL_TABLET | ORAL | 0 refills | Status: DC

## 2022-01-10 MED ORDER — APIXABAN 5 MG PO TABS
10.0000 mg | ORAL_TABLET | Freq: Two times a day (BID) | ORAL | Status: DC
  Administered 2022-01-10: 10 mg via ORAL
  Filled 2022-01-10: qty 2

## 2022-01-10 MED ORDER — OXYCODONE-ACETAMINOPHEN 5-325 MG PO TABS
1.0000 | ORAL_TABLET | Freq: Once | ORAL | Status: AC
  Administered 2022-01-10: 1 via ORAL
  Filled 2022-01-10: qty 1

## 2022-01-10 NOTE — ED Triage Notes (Signed)
Pt here with R leg pain onset yesterday along with swelling.

## 2022-01-10 NOTE — ED Notes (Signed)
Discharge instructions were discussed with pt. Pt verbalized understanding with no questions at this time. Pt verbalized importance of starting eliquis tomorrow and following up with vascular. Pt leaving via personal wheelchair with family member at bedside. Pt taking home L prosthetic leg.

## 2022-01-10 NOTE — Discharge Instructions (Addendum)
You have a blood clot in your right leg. I have given you your first dose of Eliquis here in the ED - please pick up your prescription tomorrow and continue taking as directed on the package instructions. Please call your vascular doctor to schedule follow up appointment. Return if symptoms worsen

## 2022-01-10 NOTE — Consult Note (Signed)
ASSESSMENT & PLAN   BILATERAL DVTS: This patient has a right popliteal and tibial DVT on the right which appears acute.  Given that this clot is fairly distal I would simply recommend anticoagulation with a DOAC and leg elevation as tolerated.  On the left side he has a partially occlusive clot in his external iliac vein and femoral vein.  However, given that he has an AKA on the right I do not think he is a candidate for thrombolysis or mechanical thrombectomy.  The only indication for that really would be to lower his risk of post thrombotic syndrome which I do not think is an issue given that he has an AKA on that side.  PERIPHERAL ARTERIAL DISEASE: Based on his previous arteriogram he has known severe tibial artery occlusive disease.  Based on my Doppler assessment I do not think this is changed significantly.  I will arrange follow-up in 6 months for continued follow-up of his peripheral arterial disease.  Currently however I do not think he needs any further work-up for this and the main issue appears to be his DVT.    REASON FOR CONSULT:    Right leg pain and swelling  HPI:   [name] [name] is a 61 y.o. male who has been having some swelling off and on in the right leg for the last month.  However the swelling became more Persistent yesterday and he presented to the emergency department.  The patient does describe some claudication in the right calf.  He has a prosthesis for his left AKA and has been getting physical therapy to learn how to use this.  He complains of some pain in the right calf with ambulation.  He also has some occasional pain in his foot at night.  Is difficult to determine if this is rest pain.  His risk factors for peripheral arterial disease include hypertension, hypercholesterolemia, a family history of premature cardiovascular disease, and tobacco use.  He smokes a third of a pack per day.  He tells me he is trying to quit.  He denies any chest pain.  He said no  previous history of DVT.  He had multiple previous revascularization attempts on the left before he ultimately required a left AKA.  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   Hypertension    states is borderline on medication; has been on med. x 5-6 yr.   Hypokalemia 05/30/2018   Impingement syndrome of shoulder region 10/2014   left   Insomnia 06/01/2015   Ischemic ulcer of toe of left foot (HCC)    Left shoulder pain 12/16/2015   Low back pain without sciatica 10/29/2009   Lupus ([place])    Peripheral vascular disease (HCC)    Pneumonia    RA (rheumatoid arthritis) ([place])    Rotator cuff tear 11/04/2014    Family History  Problem Relation Age of Onset   Heart attack Father 80   Alcohol abuse Father    Aneurysm Mother    Stroke Neg Hx    Cancer Neg Hx     SOCIAL HISTORY: Social History   Tobacco Use   Smoking status: Every Day  Packs/day: 0.25    Years: 20.00    Total pack years: 5.00    Types: Cigarettes    Last attempt to quit: 02/19/2019    Years since quitting: 2.8   Smokeless tobacco: Never   Tobacco comments:    Patient stated he smokes a couple day and is attempting to quit smoking  Substance Use Topics   Alcohol use: No    Alcohol/week: 0.0 standard drinks of alcohol    Comment: sober 1998    Allergies  Allergen Reactions   Dilaudid [Hydromorphone Hcl] Rash   Ramipril Rash and Other (See Comments)    Per patient on R arm and leg only; no angioedema   Tramadol Itching and Rash    Current Facility-Administered Medications  Medication Dose Route Frequency Provider Last Rate Last Admin   apixaban (ELIQUIS) tablet 10 mg  10 mg Oral BID Kathe Becton R, PA-C   10 mg at 01/10/22 2100   Current  Outpatient Medications  Medication Sig Dispense Refill   amLODipine (NORVASC) 10 MG tablet TAKE 1 TABLET BY MOUTH EVERY DAY 90 tablet 1   atorvastatin (LIPITOR) 80 MG tablet Take 1 tablet (80 mg total) by mouth daily at 6 PM. 30 tablet 11   chlorthalidone (HYGROTON) 25 MG tablet TAKE 1 TABLET BY MOUTH EVERY DAY 90 tablet 1   clopidogrel (PLAVIX) 75 MG tablet Take 1 tablet (75 mg total) by mouth daily. 30 tablet 11   diclofenac Sodium (VOLTAREN) 1 % GEL Apply 2-4 g topically in the morning, at noon, in the evening, and at bedtime.     docusate sodium (COLACE) 100 MG capsule Take 1 capsule (100 mg total) by mouth daily. 10 capsule 0   doxycycline (VIBRAMYCIN) 100 MG capsule Take 1 capsule (100 mg total) by mouth 2 (two) times daily. 14 capsule 0   DULoxetine (CYMBALTA) 30 MG capsule Take 30 mg by mouth daily.     gabapentin (NEURONTIN) 100 MG capsule Take 100 mg by mouth at bedtime as needed (for pain).      gabapentin (NEURONTIN) 300 MG capsule Take 300 mg by mouth 2 (two) times daily.     Glycerin-Polysorbate 80 (REFRESH DRY EYE THERAPY OP) Place 1 drop into both eyes 3 (three) times daily as needed (for dryness or irritation).      ibuprofen (ADVIL) 200 MG tablet Take 200-600 mg by mouth every 6 (six) hours as needed for headache or mild pain.     naloxone (NARCAN) 2 MG/2ML injection Inject 2 mg into the muscle once as needed (opiod overdose (call 911, inject intramuscularly in shoulder or thigh. Repeat every 3 minutes)).      predniSONE (DELTASONE) 10 MG tablet TAKE 1 TABLET BY MOUTH EVERY DAY 30 tablet 0   varenicline (CHANTIX PAK) 0.5 MG X 11 & 1 MG X 42 tablet TAKE ONE 0.5 MG TABLET BY MOUTH ONCE DAILY FOR 3 DAYS, THEN INCREASE TO ONE 0.5 MG TABLET TWICE DAILY FOR 4 DAYS, THEN INCREASE TO ONE 1 MG TABLET TWICE DAILY. 53 each 0   varenicline (CHANTIX) 1 MG tablet TAKE 1 TABLET BY MOUTH TWICE A DAY 60 tablet 1    REVIEW OF SYSTEMS:  '[X]'$  denotes positive finding, '[ ]'$  denotes negative  finding Cardiac  Comments:  Chest pain or chest pressure:    Shortness of breath upon exertion: x   Short of breath when lying flat:    Irregular heart rhythm:        Vascular  Pain in calf, thigh, or hip brought on by ambulation:    Pain in feet at night that wakes you up from your sleep:     Blood clot in your veins:    Leg swelling:  x       Pulmonary    Oxygen at home:    Productive cough:     Wheezing:         Neurologic    Sudden weakness in arms or legs:     Sudden numbness in arms or legs:     Sudden onset of difficulty speaking or slurred speech:    Temporary loss of vision in one eye:     Problems with dizziness:         Gastrointestinal    Blood in stool:     Vomited blood:         Genitourinary    Burning when urinating:     Blood in urine:        Psychiatric    Major depression:         Hematologic    Bleeding problems:    Problems with blood clotting too easily:        Skin    Rashes or ulcers:        Constitutional    Fever or chills:    -  PHYSICAL EXAM:   Vitals:   01/10/22 2015 01/10/22 2030 01/10/22 2045 01/10/22 2100  BP: 124/84 126/80 (!) 138/101 133/83  Pulse: 72 77 81 78  Resp: 18   16  Temp:      TempSrc:      SpO2: 96% 93% 94% 96%   There is no height or weight on file to calculate BMI. GENERAL: The patient is a well-nourished male, in no acute distress. The vital signs are documented above. CARDIAC: There is a regular rate and rhythm.  VASCULAR: I do not detect carotid bruits. On the right side, he has a palpable femoral pulse.  I cannot palpate a popliteal or pedal pulses.  He does have a fairly brisk peroneal and anterior tibial signal with the Doppler.  I cannot get a DP or posterior tibial signal.  This would fit with his previous findings on arteriography. On the left side he has an AKA which does not have significant swelling. He has mild swelling in the right leg. PULMONARY: There is good air exchange bilaterally  without wheezing or rales. ABDOMEN: Soft and non-tender with normal pitched bowel sounds.  MUSCULOSKELETAL: He has a right AKA. NEUROLOGIC: No focal weakness or paresthesias are detected. SKIN: There are no ulcers or rashes noted. PSYCHIATRIC: The patient has a normal affect.  DATA:    VENOUS DUPLEX: I have independently interpreted his venous duplex scan today.  On the right side, which is the side of concern he has acute thrombus of the right popliteal vein, peroneal veins, and posterior tibial veins.  He also has superficial thrombus in the small saphenous vein and great saphenous vein.  On the left side, he has an AKA.  He has partially occlusive thrombus in the left external iliac vein and common femoral vein.  ARTERIAL DOPPLER STUDY: I reviewed his arterial Doppler study that was done on 07/19/2021.  At that time he had no posterior tibial signal with the Doppler.  He had a biphasic peroneal signal and a monophasic DP signal.  Based on his previous arteriogram in 2020 he has known severe tibial artery occlusive disease with anterior tibial runoff  only which reconstitutes his peroneal artery distally but does not runoff into the foot.  Deitra Mayo [facility]

## 2022-01-10 NOTE — ED Provider Triage Note (Signed)
Emergency Medicine Provider Triage Evaluation Note  [name] [name] , a 61 y.o. male  was evaluated in triage.  Pt complains of right calf pain. States that same has been worsening over the past 4 days. Felt a knot in the back that was concerning. Denies fevers, chills, chest pain, shortness of breath. No hx of blood clot  Review of Systems  Positive:  Negative:   Physical Exam  BP 122/81 (BP Location: Left Arm)   Pulse 78   Temp 98.3 F (36.8 C) (Oral)   Resp 16   SpO2 97%  Gen:   Awake, no distress   Resp:  Normal effort  MSK:   Moves extremities without difficulty  Other:  Tenderness to palpation of the right lower calf. DP and PT pulses intact. Distal sensation intact. No palpable cord  Medical Decision Making  Medically screening exam initiated at 4:11 PM.  Appropriate orders placed.  [name] [name] was informed that the remainder of the evaluation will be completed by another provider, this initial triage assessment does not replace that evaluation, and the importance of remaining in the ED until their evaluation is complete.     [name] Face, PA-C 01/10/22 [phone]
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
## 2022-01-10 NOTE — Progress Notes (Signed)
Right LE venous duplex study completed. Please see CV Proc for preliminary results.  Solangel Mcmanaway BS, RVT 01/10/2022 5:09 PM

## 2022-01-11 ENCOUNTER — Encounter: Payer: Medicare Other | Admitting: Physical Therapy

## 2022-01-17 ENCOUNTER — Telehealth: Payer: Self-pay

## 2022-01-17 NOTE — Telephone Encounter (Signed)
Received staff msg from Leilani Merl stating pt called to make f/u appt.  Spoke with Sam PA who reviewed pt's chart. She stated that the pt didn't need to come in for a f/u according to Dr Nicole Cella consultation note. Called pt, two identifiers used. Informed him to call if he had any worsening symptoms, but no f/u appt was needed. Confirmed understanding.

## 2022-01-18 ENCOUNTER — Ambulatory Visit: Payer: Medicare Other

## 2022-01-23 DIAGNOSIS — M5136 Other intervertebral disc degeneration, lumbar region: Secondary | ICD-10-CM | POA: Diagnosis not present

## 2022-01-23 DIAGNOSIS — G8929 Other chronic pain: Secondary | ICD-10-CM | POA: Diagnosis not present

## 2022-01-23 DIAGNOSIS — I119 Hypertensive heart disease without heart failure: Secondary | ICD-10-CM | POA: Diagnosis not present

## 2022-01-23 DIAGNOSIS — R5382 Chronic fatigue, unspecified: Secondary | ICD-10-CM | POA: Diagnosis not present

## 2022-01-23 DIAGNOSIS — M4326 Fusion of spine, lumbar region: Secondary | ICD-10-CM | POA: Diagnosis not present

## 2022-01-23 DIAGNOSIS — I6529 Occlusion and stenosis of unspecified carotid artery: Secondary | ICD-10-CM | POA: Diagnosis not present

## 2022-01-23 DIAGNOSIS — M48062 Spinal stenosis, lumbar region with neurogenic claudication: Secondary | ICD-10-CM | POA: Diagnosis not present

## 2022-01-23 DIAGNOSIS — M47897 Other spondylosis, lumbosacral region: Secondary | ICD-10-CM | POA: Diagnosis not present

## 2022-01-23 DIAGNOSIS — M25519 Pain in unspecified shoulder: Secondary | ICD-10-CM | POA: Diagnosis not present

## 2022-01-23 DIAGNOSIS — M4807 Spinal stenosis, lumbosacral region: Secondary | ICD-10-CM | POA: Diagnosis not present

## 2022-01-23 DIAGNOSIS — I739 Peripheral vascular disease, unspecified: Secondary | ICD-10-CM | POA: Diagnosis not present

## 2022-01-23 DIAGNOSIS — I70213 Atherosclerosis of native arteries of extremities with intermittent claudication, bilateral legs: Secondary | ICD-10-CM | POA: Diagnosis not present

## 2022-01-23 DIAGNOSIS — M792 Neuralgia and neuritis, unspecified: Secondary | ICD-10-CM | POA: Diagnosis not present

## 2022-01-23 DIAGNOSIS — M069 Rheumatoid arthritis, unspecified: Secondary | ICD-10-CM | POA: Diagnosis not present

## 2022-01-23 DIAGNOSIS — I70209 Unspecified atherosclerosis of native arteries of extremities, unspecified extremity: Secondary | ICD-10-CM | POA: Diagnosis not present

## 2022-01-23 DIAGNOSIS — G894 Chronic pain syndrome: Secondary | ICD-10-CM | POA: Diagnosis not present

## 2022-01-25 ENCOUNTER — Ambulatory Visit: Payer: Medicare Other | Attending: Vascular Surgery

## 2022-01-25 DIAGNOSIS — R2681 Unsteadiness on feet: Secondary | ICD-10-CM | POA: Insufficient documentation

## 2022-01-25 DIAGNOSIS — R2689 Other abnormalities of gait and mobility: Secondary | ICD-10-CM | POA: Insufficient documentation

## 2022-01-25 DIAGNOSIS — M6281 Muscle weakness (generalized): Secondary | ICD-10-CM | POA: Insufficient documentation

## 2022-01-25 NOTE — Therapy (Signed)
OUTPATIENT PHYSICAL THERAPY TREATMENT NOTE        Patient Name: [name] [name] MRN: [id number] DOB:1961-01-03, 61 y.o., male Today's Date: 01/25/2022  PCP: Angelique Blonder, DO REFERRING PROVIDER: Deitra Mayo, MD   PT End of Session - 01/25/22 1443     Visit Number 33    Number of Visits 19    Date for PT Re-Evaluation 02/24/22    Authorization Type UHC medicare with medicaid secondary so 10th visit progress note    Progress Note Due on Visit 41    PT Start Time 1440    PT Stop Time 1530    PT Time Calculation (min) 50 min    Equipment Utilized During Treatment Gait belt    Activity Tolerance Patient tolerated treatment well    Behavior During Therapy WFL for tasks assessed/performed             Past Medical History:  Diagnosis Date   Anemia    Anemia 08/06/2012   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   Hypertension    states is borderline on medication; has been on med. x 5-6 yr.   Hypokalemia 05/30/2018   Impingement syndrome of shoulder region 10/2014   left   Insomnia 06/01/2015   Ischemic ulcer of toe of left foot (HCC)    Left shoulder pain 12/16/2015   Low back pain without sciatica 10/29/2009   Lupus ([place])    Peripheral vascular disease (HCC)    Pneumonia    RA (rheumatoid arthritis) ([place])    Rotator cuff tear 11/04/2014   Past Surgical History:  Procedure Laterality Date   ABDOMINAL AORTAGRAM  02/20/2019   ABDOMINAL AORTOGRAM W/LOWER EXTREMITY N/A 02/20/2019   Procedure: ABDOMINAL AORTOGRAM W/LOWER EXTREMITY;  Surgeon: Marty Heck, MD;  Location: [facility];  Service: Cardiovascular;  Laterality: N/A;   ACHILLES TENDON SURGERY Bilateral    AMPUTATION Left 05/11/2020   Procedure: LEFT ABOVE KNEE AMPUTATION;  Surgeon: Angelia Mould,  MD;  Location: [facility];  Service: Vascular;  Laterality: Left;   ENDARTERECTOMY POPLITEAL Left 05/01/2020   Procedure: ENDARTERECTOMY POPLITEAL;  Surgeon: Waynetta Sandy, MD;  Location: [facility];  Service: Vascular;  Laterality: Left;   FASCIOTOMY Left 05/01/2020   Procedure: FASCIOTOMY;  Surgeon: Waynetta Sandy, MD;  Location: [facility];  Service: Vascular;  Laterality: Left;   FEMORAL-POPLITEAL BYPASS GRAFT Left 02/26/2019   Procedure: BYPASS GRAFT FEMORAL-POPLITEAL ARTERY LEFT LEG USING 63m PROPATEN GRAFT;  Surgeon: BSerafina Mitchell MD;  [place] [facility]  Service: Vascular;  Laterality: Left;   INTRAOPERATIVE ARTERIOGRAM Left 01/22/2020   Procedure: INTRA OPERATIVE ARTERIOGRAM with administration of thrombolyics in Left femoral to popliteal bypass.;  Surgeon: CMarty Heck MD;  [place] [facility]  Service: Vascular;  Laterality: Left;   LEFT HEART CATH AND CORONARY ANGIOGRAPHY N/A 03/15/2017   Procedure: LEFT HEART CATH AND CORONARY ANGIOGRAPHY;  Surgeon: ENelva Bush MD;  [facility];  Service: Cardiovascular;  Laterality: N/A;   LOWER EXTREMITY INTERVENTION Left 04/29/2020   Procedure: LOWER EXTREMITY INTERVENTION- LYSIS;  Surgeon: CMarty Heck MD;  [facility];  Service: Cardiovascular;  Laterality: Left;   OSTEOTOMY AND ULNAR SHORTENING Right 07/22/2002   PATCH ANGIOPLASTY Left  05/01/2020   Procedure: [place];  Surgeon: Waynetta Sandy, MD;  Location: [place] [facility];  Service: Vascular;  Laterality: Left;   PERIPHERAL VASCULAR ATHERECTOMY  01/23/2020   Procedure: PERIPHERAL VASCULAR ATHERECTOMY;  Surgeon: Marty Heck, MD;  Location: [facility];  Service: Cardiovascular;;   PERIPHERAL VASCULAR BALLOON ANGIOPLASTY  01/23/2020   Procedure: PERIPHERAL VASCULAR BALLOON ANGIOPLASTY;  Surgeon: Marty Heck, MD;  Location: [facility];  Service:  Cardiovascular;;   PERIPHERAL VASCULAR INTERVENTION Left 04/30/2020   Procedure: PERIPHERAL VASCULAR INTERVENTION;  Surgeon: Cherre Robins, MD;  Location: [facility];  Service: Cardiovascular;  Laterality: Left;   PERIPHERAL VASCULAR THROMBECTOMY N/A 01/23/2020   Procedure: lysis recheck;  Surgeon: Marty Heck, MD;  Location: [facility];  Service: Cardiovascular;  Laterality: N/A;  + [place]    PERIPHERAL VASCULAR THROMBECTOMY N/A 04/30/2020   Procedure: Lysis Recheck;  Surgeon: Cherre Robins, MD;  Location: [facility];  Service: Cardiovascular;  Laterality: N/A;   SHOULDER ARTHROSCOPY WITH BICEPSTENOTOMY Right 03/11/2015   Procedure: SHOULDER ARTHROSCOPY WITH BICEPSTENOTOMY;  Surgeon: Ninetta Lights, MD;  Location: [facility];  Service: Orthopedics;  Laterality: Right;   SHOULDER ARTHROSCOPY WITH DISTAL CLAVICLE RESECTION Left 11/19/2014   Procedure: SHOULDER ARTHROSCOPY WITH DISTAL CLAVICLE RESECTION;  Surgeon: Kathryne Hitch, MD;  Location: [facility];  Service: Orthopedics;  Laterality: Left;   SHOULDER ARTHROSCOPY WITH ROTATOR CUFF REPAIR AND SUBACROMIAL DECOMPRESSION Left 11/19/2014   Procedure: LEFT SHOULDER ARTHROSCOPY, DEBRIDEMENT DISTAL CLAVICLE EXCISION, ACROMIOPLASTY WITH ROTATOR CUFF REPAIR ;  Surgeon: Kathryne Hitch, MD;  Location: [facility];  Service: Orthopedics;  Laterality: Left;   THROMBECTOMY OF BYPASS GRAFT FEMORAL- POPLITEAL ARTERY Left 05/01/2020   Procedure: THROMBECTOMY OF LOWER EXTREMITY;  Surgeon: Waynetta Sandy, MD;  Location: [facility];  Service: Vascular;  Laterality: Left;   TRANSFORAMINAL LUMBAR INTERBODY FUSION (TLIF) WITH PEDICLE SCREW FIXATION 1 LEVEL N/A 01/03/2018   Procedure: TRANSFORAMINAL LUMBAR INTERBODY FUSION (TLIF) LUMBAR FIVE-SACRAL ONE;  Surgeon: Melina Schools, MD;  Location: [facility];  Service: Orthopedics;  Laterality: N/A;   ULTRASOUND GUIDANCE FOR VASCULAR ACCESS Right  01/22/2020   Procedure: ULTRASOUND GUIDANCE FOR VASCULAR ACCESS Right Common Femoral Artery.;  Surgeon: Marty Heck, MD;  Location: [facility];  Service: Vascular;  Laterality: Right;   VIDEO ASSISTED THORACOSCOPY (VATS)/DECORTICATION Right 11/21/2011   drainage of empyema   Patient Active Problem List   Diagnosis Date Noted   Bite wound of left hand 09/27/2021   Arm pain 07/25/2021   Paronychia of great toe 01/28/2021   Onychomycosis 01/28/2021   Critical limb ischemia with history of revascularization of same extremity ([place]) 04/28/2020   History of COVID-19 05/10/2019   PAD (peripheral artery disease) ([place])    History of critical lower limb ischemia 02/19/2019   S/P lumbar fusion, Lower back pain 01/03/2018   Coronary artery disease involving native coronary artery of native heart with unstable angina pectoris ([place])    Preventative health care 03/13/2017   Transaminitis 12/16/2015   Bilateral shoulder pain 08/20/2012   Tobacco use disorder 08/20/2012   HTN (hypertension) 04/20/2011   HLD (hyperlipidemia) 10/08/2009   Rheumatoid arthritis ([place]) 08/11/2009    REFERRING DIAG: left AKA  THERAPY DIAG:  Other abnormalities of gait and mobility  Unsteadiness on feet  Muscle weakness (generalized)  PERTINENT HISTORY: RA, DM2, HTN, PAD, DDD,  s/p multiple L LE revascularizations discharged from  hospital a 05/05/20.  At that time he was deemed to not be a candidate for further revasc procedures.  Dc'd home with a known bypass occlusion with a plan for AKA if worsening pain. Returned to ED 05/08/20  for increased pain and seeking amputation. s/p L AKA 05/11/20   PRECAUTIONS: fall  SUBJECTIVE: Pt reports that he hasn't been in therapy because he was diagnosed with blood clots in bil legs and went to ED. They put him on blood thinners and told him to stand for 30  minutes per day. R lower leg is sore to squeeze.  PAIN:  Are you having pain? Yes NPRS scale:7-8/10 Pain location: R  ankle Pain orientation:  PAIN TYPE: acute Pain description: sharp Aggravating factors: arthritis Relieving factors: heat and [place]     TODAY'S TREATMENT: LOWER EXTREMITY ROM:  Active/passive ROM Right eval Left eval  Hip flexion    Hip extension    Hip abduction    Hip adduction    Hip internal rotation    Hip external rotation    Knee flexion    Knee extension    Ankle dorsiflexion -5/15   Ankle plantarflexion    Ankle inversion 5/30   Ankle eversion 15/25    (Blank rows = not tested)  LOWER EXTREMITY MMT:  MMT Right eval Left eval  Hip flexion    Hip extension    Hip abduction    Hip adduction    Hip internal rotation    Hip external rotation    Knee flexion    Knee extension    Ankle dorsiflexion 5/5 pain   Ankle plantarflexion    Ankle inversion 4/5   Ankle eversion 4/5 pain    (Blank rows = not tested)   No pitting edema noted: 1+ in R LE Pt is sore to touch on one spot on R medial lower leg. Seated elevated R LE: ankle pumps: 5 sec holds at end range: 2 x 3' Seated elevated R LE: ankle inversion/eversion: 5sec holds at end range: 2 x 3' Pt educated on staying compliance with blood thinners, educated on s/s of pulmonary embolism and if he experiences SOB more than typical to go to ED. Pt also educated to call his IM to see if compression socks will be recommended and/or beneficial? Gait training: with walker and CGA, 50 feet  SciFit: level 1 for 5' UE and LE        PATIENT EDUCATION: Education details: continue with current HEP; Adding 4 more visits to continue to work on increasing gait at home, progress towards goals.  Person educated: Patient Education method: Explanation, Demonstration, and Verbal cues Education comprehension: verbalized understanding, returned demonstration, verbal cues required, and needs further education     HOME EXERCISE PROGRAM: [id number]    GOALS:  SHORT TERM GOALS:   Target date: 11/08/2021  Pt will  tolerate wearing prosthesis for 8 hours/day consistently for improved mobility in home. Baseline: wearing 2-3 hours, 2x/day  Goal status: IN PROGRESS  2.  Pt will ambulate 250' on level surfaces inside consistently with RW mod I for improved mobility. Baseline: 250' 11/14/21 Goal status: MET  3.  Pt will be able to maintain standing without UE support/occasional touch performing functional tasks x 3 min for improved balance and standing ADLs. Baseline:3 mins with intermittent support at counter top while performing tasks.  Goal status: MET      UPDATED LONG TERM GOALS:  Target date: 02/24/2022    Pt will be independent  with progressive HEP for strengthening and balance to continue gains on own. Baseline: pt reports doing at home  Goal status: Ongoing   2.  Pt will increase gait speed to >0.57ms for improved household mobility  Baseline: 11/30/21 0.24 m/s Goal status: Ongoing    3.  Pt will ambulate up/down 4 steps with rails, up/down curb and ramp, S for improved community access with prosthesis locked out.  Baseline: Needs min A to negotiate stairs with B rails, curb and ramp also needing min A and max cues for safety and technique.  Goal status: Ongoing   4.  Pt will increase Berg from 30 to >/=34/56 for improved balance and decreased fall risk. Baseline: 10/11/21 30/56 Goal status: Progressing  5. Pt will demo at least 10 deg of L ankle AROM with dorsiflexion to improve ankle mobility with walking.  Baseline: -5 deg 12/30/21  Gaol status: revised     Plan -     Clinical Impression Statement Pt missed last few sessions due to development of new blood clots. He is on blood thinners now. He still has some residual soreness in R lower medial leg. Session was focused on working on ankle ROM to improve mobility of tissues and joints of R LE and gait training.   Personal Factors and Comorbidities Comorbidity 3+;Time since onset of injury/illness/exacerbation    Comorbidities RA,  DM2, HTN, PAD, DDD,  s/p multiple L LE revascularizations discharged from hospital a 05/05/20.  At that time he was deemed to not be a candidate for further revasc procedures.  Dc'd home with a known bypass occlusion with a plan for AKA if worsening pain. Returned to ED 05/08/20  for increased pain and seeking amputation. s/p L AKA 05/11/20. Pt reports 3 shoulder surgeries on each shoulder and low back surgery    Examination-Activity Limitations Bathing;Locomotion Level;Transfers;Stairs;Stand;Squat;Lift    Examination-Participation Restrictions Church;Meal Prep    Stability/Clinical Decision Making Evolving/Moderate complexity    Rehab Potential Good    PT Frequency 1x/week   PT Duration 8 weeks    PT Treatment/Interventions ADLs/Self Care Home Management;DME Instruction;Gait training;Stair training;Functional mobility training;Therapeutic activities;Therapeutic exercise;Balance training;Neuromuscular re-education;Manual techniques;Passive range of motion;Prosthetic Training;Patient/family education;Vestibular , manual therapy, joint mobilization   PT Next Visit Plan  Continue with gait training as tolerated   Consulted and Agree with Plan of Care Patient;             KKerrie Pleasure PT 01/25/2022, 3:14 PM

## 2022-01-26 ENCOUNTER — Ambulatory Visit: Payer: Self-pay | Admitting: Licensed Clinical Social Worker

## 2022-01-26 NOTE — Patient Outreach (Signed)
  Care Coordination   Initial Visit Note   01/26/2022 Name: [name] [name] MRN: [id number] DOB: [birth year]  [name] [name] is a 61 y.o. year old male who sees Angelique Blonder, DO for primary care. Unsuccessful outreach on today. SW will try again by end of day. SW reviewed chart in encounter.      SDOH assessments and interventions completed:  No     Care Coordination Interventions Activated:  Yes  Care Coordination Interventions:  No, not indicated   Follow up plan: Follow up call scheduled for the next 7 days.     Encounter Outcome:  Pt. Scheduled   Lenor Derrick, MSW  Social Worker [facility] Care Management  [phone]

## 2022-01-27 ENCOUNTER — Ambulatory Visit: Payer: Self-pay | Admitting: Licensed Clinical Social Worker

## 2022-01-27 NOTE — Patient Instructions (Signed)
Visit Information  Instructions:   Patient was given the following information about care management and care coordination services today, agreed to services, and gave verbal consent: 1.care management/care coordination services include personalized support from designated clinical staff supervised by their physician, including individualized plan of care and coordination with other care providers 2. 24/7 contact phone numbers for assistance for urgent and routine care needs. 3. The patient may stop care management/care coordination services at any time by phone call to the office staff.  Patient verbalizes understanding of instructions and care plan provided today and agrees to view in MyChart. Active MyChart status and patient understanding of how to access instructions and care plan via MyChart confirmed with patient.     No further follow up required: .  Taijuan Serviss, BSW , MSW Social Worker [facility]  [phone]      

## 2022-01-27 NOTE — Patient Outreach (Signed)
  Care Coordination   Initial Visit Note   01/27/2022 Name: [name] [name] MRN: [id number] DOB: 1961-03-12  [name] [name] is a 61 y.o. year old male who sees [name] Blonder, DO for primary care. I spoke with  [name] [name] by phone today  What matters to the patients health and wellness today?   SW completed SDOH and Needs assessment on today. SW addressed all concerns patient had. Patient declined needing further assistance. SW removes self from care team and advised patient to contact his PCP in the future if sw is needed.       SDOH assessments and interventions completed:  Yes     Care Coordination Interventions Activated:  Yes  Care Coordination Interventions:  Yes, provided   Follow up plan: No further intervention required.   Encounter Outcome:  Pt. Visit Completed

## 2022-01-31 ENCOUNTER — Inpatient Hospital Stay (HOSPITAL_COMMUNITY): Payer: Medicare Other

## 2022-01-31 ENCOUNTER — Inpatient Hospital Stay (HOSPITAL_COMMUNITY)
Admission: EM | Admit: 2022-01-31 | Discharge: 2022-02-08 | DRG: 280 | Disposition: A | Discharge status: Skilled Nursing Facility | Payer: Medicare Other | Attending: Internal Medicine | Admitting: Internal Medicine

## 2022-01-31 ENCOUNTER — Emergency Department (HOSPITAL_COMMUNITY): Payer: Medicare Other

## 2022-01-31 ENCOUNTER — Other Ambulatory Visit: Payer: Self-pay

## 2022-01-31 ENCOUNTER — Inpatient Hospital Stay: Payer: Self-pay

## 2022-01-31 ENCOUNTER — Ambulatory Visit: Payer: Medicare Other | Admitting: Rehabilitation

## 2022-01-31 ENCOUNTER — Encounter (HOSPITAL_COMMUNITY)
Admission: EM | Disposition: A | Discharge status: Skilled Nursing Facility | Payer: Self-pay | Source: Home / Self Care | Attending: Internal Medicine

## 2022-01-31 DIAGNOSIS — S2243XD Multiple fractures of ribs, bilateral, subsequent encounter for fracture with routine healing: Secondary | ICD-10-CM

## 2022-01-31 DIAGNOSIS — I213 ST elevation (STEMI) myocardial infarction of unspecified site: Secondary | ICD-10-CM | POA: Diagnosis not present

## 2022-01-31 DIAGNOSIS — J9600 Acute respiratory failure, unspecified whether with hypoxia or hypercapnia: Secondary | ICD-10-CM

## 2022-01-31 DIAGNOSIS — I469 Cardiac arrest, cause unspecified: Secondary | ICD-10-CM | POA: Diagnosis not present

## 2022-01-31 DIAGNOSIS — I499 Cardiac arrhythmia, unspecified: Secondary | ICD-10-CM | POA: Diagnosis not present

## 2022-01-31 DIAGNOSIS — R4182 Altered mental status, unspecified: Secondary | ICD-10-CM | POA: Diagnosis not present

## 2022-01-31 DIAGNOSIS — J9602 Acute respiratory failure with hypercapnia: Secondary | ICD-10-CM

## 2022-01-31 DIAGNOSIS — E7800 Pure hypercholesterolemia, unspecified: Secondary | ICD-10-CM | POA: Diagnosis present

## 2022-01-31 DIAGNOSIS — F17210 Nicotine dependence, cigarettes, uncomplicated: Secondary | ICD-10-CM | POA: Diagnosis present

## 2022-01-31 DIAGNOSIS — S2242XD Multiple fractures of ribs, left side, subsequent encounter for fracture with routine healing: Secondary | ICD-10-CM | POA: Diagnosis not present

## 2022-01-31 DIAGNOSIS — F17200 Nicotine dependence, unspecified, uncomplicated: Secondary | ICD-10-CM | POA: Diagnosis present

## 2022-01-31 DIAGNOSIS — M3211 Endocarditis in systemic lupus erythematosus: Secondary | ICD-10-CM | POA: Diagnosis not present

## 2022-01-31 DIAGNOSIS — I130 Hypertensive heart and chronic kidney disease with heart failure and stage 1 through stage 4 chronic kidney disease, or unspecified chronic kidney disease: Secondary | ICD-10-CM | POA: Diagnosis not present

## 2022-01-31 DIAGNOSIS — S2220XD Unspecified fracture of sternum, subsequent encounter for fracture with routine healing: Secondary | ICD-10-CM | POA: Diagnosis not present

## 2022-01-31 DIAGNOSIS — L89311 Pressure ulcer of right buttock, stage 1: Secondary | ICD-10-CM | POA: Diagnosis present

## 2022-01-31 DIAGNOSIS — J9811 Atelectasis: Secondary | ICD-10-CM | POA: Diagnosis not present

## 2022-01-31 DIAGNOSIS — M96A1 Fracture of sternum associated with chest compression and cardiopulmonary resuscitation: Secondary | ICD-10-CM | POA: Diagnosis present

## 2022-01-31 DIAGNOSIS — Z20822 Contact with and (suspected) exposure to covid-19: Secondary | ICD-10-CM | POA: Diagnosis present

## 2022-01-31 DIAGNOSIS — Z7901 Long term (current) use of anticoagulants: Secondary | ICD-10-CM

## 2022-01-31 DIAGNOSIS — I462 Cardiac arrest due to underlying cardiac condition: Secondary | ICD-10-CM | POA: Diagnosis present

## 2022-01-31 DIAGNOSIS — N183 Chronic kidney disease, stage 3 unspecified: Secondary | ICD-10-CM | POA: Diagnosis present

## 2022-01-31 DIAGNOSIS — E872 Acidosis, unspecified: Secondary | ICD-10-CM

## 2022-01-31 DIAGNOSIS — L8990 Pressure ulcer of unspecified site, unspecified stage: Secondary | ICD-10-CM

## 2022-01-31 DIAGNOSIS — M1990 Unspecified osteoarthritis, unspecified site: Secondary | ICD-10-CM | POA: Diagnosis present

## 2022-01-31 DIAGNOSIS — G8929 Other chronic pain: Secondary | ICD-10-CM | POA: Diagnosis present

## 2022-01-31 DIAGNOSIS — Z885 Allergy status to narcotic agent status: Secondary | ICD-10-CM

## 2022-01-31 DIAGNOSIS — I2510 Atherosclerotic heart disease of native coronary artery without angina pectoris: Secondary | ICD-10-CM | POA: Diagnosis present

## 2022-01-31 DIAGNOSIS — K219 Gastro-esophageal reflux disease without esophagitis: Secondary | ICD-10-CM | POA: Diagnosis present

## 2022-01-31 DIAGNOSIS — R570 Cardiogenic shock: Secondary | ICD-10-CM | POA: Diagnosis not present

## 2022-01-31 DIAGNOSIS — I824Z9 Acute embolism and thrombosis of unspecified deep veins of unspecified distal lower extremity: Secondary | ICD-10-CM | POA: Diagnosis not present

## 2022-01-31 DIAGNOSIS — Z89612 Acquired absence of left leg above knee: Secondary | ICD-10-CM

## 2022-01-31 DIAGNOSIS — M96A3 Multiple fractures of ribs associated with chest compression and cardiopulmonary resuscitation: Secondary | ICD-10-CM | POA: Diagnosis present

## 2022-01-31 DIAGNOSIS — Z8673 Personal history of transient ischemic attack (TIA), and cerebral infarction without residual deficits: Secondary | ICD-10-CM

## 2022-01-31 DIAGNOSIS — Z888 Allergy status to other drugs, medicaments and biological substances status: Secondary | ICD-10-CM

## 2022-01-31 DIAGNOSIS — E876 Hypokalemia: Secondary | ICD-10-CM | POA: Diagnosis present

## 2022-01-31 DIAGNOSIS — I4901 Ventricular fibrillation: Principal | ICD-10-CM | POA: Diagnosis present

## 2022-01-31 DIAGNOSIS — Z743 Need for continuous supervision: Secondary | ICD-10-CM | POA: Diagnosis not present

## 2022-01-31 DIAGNOSIS — M069 Rheumatoid arthritis, unspecified: Secondary | ICD-10-CM | POA: Diagnosis present

## 2022-01-31 DIAGNOSIS — R1311 Dysphagia, oral phase: Secondary | ICD-10-CM | POA: Diagnosis not present

## 2022-01-31 DIAGNOSIS — Z452 Encounter for adjustment and management of vascular access device: Secondary | ICD-10-CM | POA: Diagnosis not present

## 2022-01-31 DIAGNOSIS — Z8249 Family history of ischemic heart disease and other diseases of the circulatory system: Secondary | ICD-10-CM

## 2022-01-31 DIAGNOSIS — K7200 Acute and subacute hepatic failure without coma: Secondary | ICD-10-CM | POA: Diagnosis not present

## 2022-01-31 DIAGNOSIS — D631 Anemia in chronic kidney disease: Secondary | ICD-10-CM | POA: Diagnosis not present

## 2022-01-31 DIAGNOSIS — E8770 Fluid overload, unspecified: Secondary | ICD-10-CM | POA: Diagnosis not present

## 2022-01-31 DIAGNOSIS — M25512 Pain in left shoulder: Secondary | ICD-10-CM | POA: Diagnosis present

## 2022-01-31 DIAGNOSIS — Z23 Encounter for immunization: Secondary | ICD-10-CM | POA: Diagnosis not present

## 2022-01-31 DIAGNOSIS — I25110 Atherosclerotic heart disease of native coronary artery with unstable angina pectoris: Secondary | ICD-10-CM | POA: Diagnosis present

## 2022-01-31 DIAGNOSIS — S27329A Contusion of lung, unspecified, initial encounter: Secondary | ICD-10-CM

## 2022-01-31 DIAGNOSIS — S27321A Contusion of lung, unilateral, initial encounter: Secondary | ICD-10-CM | POA: Diagnosis present

## 2022-01-31 DIAGNOSIS — R531 Weakness: Secondary | ICD-10-CM | POA: Diagnosis not present

## 2022-01-31 DIAGNOSIS — J9601 Acute respiratory failure with hypoxia: Secondary | ICD-10-CM | POA: Diagnosis not present

## 2022-01-31 DIAGNOSIS — I739 Peripheral vascular disease, unspecified: Secondary | ICD-10-CM | POA: Diagnosis present

## 2022-01-31 DIAGNOSIS — Z7401 Bed confinement status: Secondary | ICD-10-CM | POA: Diagnosis not present

## 2022-01-31 DIAGNOSIS — I700 Atherosclerosis of aorta: Secondary | ICD-10-CM | POA: Diagnosis not present

## 2022-01-31 DIAGNOSIS — I493 Ventricular premature depolarization: Secondary | ICD-10-CM | POA: Diagnosis present

## 2022-01-31 DIAGNOSIS — Z89611 Acquired absence of right leg above knee: Secondary | ICD-10-CM

## 2022-01-31 DIAGNOSIS — M6281 Muscle weakness (generalized): Secondary | ICD-10-CM | POA: Diagnosis not present

## 2022-01-31 DIAGNOSIS — I82409 Acute embolism and thrombosis of unspecified deep veins of unspecified lower extremity: Secondary | ICD-10-CM

## 2022-01-31 DIAGNOSIS — R6889 Other general symptoms and signs: Secondary | ICD-10-CM | POA: Diagnosis not present

## 2022-01-31 DIAGNOSIS — G9341 Metabolic encephalopathy: Secondary | ICD-10-CM | POA: Diagnosis not present

## 2022-01-31 DIAGNOSIS — S2220XA Unspecified fracture of sternum, initial encounter for closed fracture: Secondary | ICD-10-CM

## 2022-01-31 DIAGNOSIS — N170 Acute kidney failure with tubular necrosis: Secondary | ICD-10-CM | POA: Diagnosis not present

## 2022-01-31 DIAGNOSIS — R739 Hyperglycemia, unspecified: Secondary | ICD-10-CM

## 2022-01-31 DIAGNOSIS — D696 Thrombocytopenia, unspecified: Secondary | ICD-10-CM | POA: Diagnosis not present

## 2022-01-31 DIAGNOSIS — Z66 Do not resuscitate: Secondary | ICD-10-CM | POA: Diagnosis not present

## 2022-01-31 DIAGNOSIS — I472 Ventricular tachycardia, unspecified: Secondary | ICD-10-CM | POA: Diagnosis present

## 2022-01-31 DIAGNOSIS — X58XXXA Exposure to other specified factors, initial encounter: Secondary | ICD-10-CM | POA: Diagnosis present

## 2022-01-31 DIAGNOSIS — R9431 Abnormal electrocardiogram [ECG] [EKG]: Secondary | ICD-10-CM

## 2022-01-31 DIAGNOSIS — I69391 Dysphagia following cerebral infarction: Secondary | ICD-10-CM | POA: Diagnosis not present

## 2022-01-31 DIAGNOSIS — I50812 Chronic right heart failure: Secondary | ICD-10-CM | POA: Diagnosis present

## 2022-01-31 DIAGNOSIS — Z7902 Long term (current) use of antithrombotics/antiplatelets: Secondary | ICD-10-CM

## 2022-01-31 DIAGNOSIS — J690 Pneumonitis due to inhalation of food and vomit: Secondary | ICD-10-CM | POA: Diagnosis not present

## 2022-01-31 DIAGNOSIS — R29818 Other symptoms and signs involving the nervous system: Secondary | ICD-10-CM | POA: Diagnosis not present

## 2022-01-31 DIAGNOSIS — Z811 Family history of alcohol abuse and dependence: Secondary | ICD-10-CM

## 2022-01-31 DIAGNOSIS — R54 Age-related physical debility: Secondary | ICD-10-CM | POA: Diagnosis present

## 2022-01-31 DIAGNOSIS — E785 Hyperlipidemia, unspecified: Secondary | ICD-10-CM | POA: Diagnosis present

## 2022-01-31 DIAGNOSIS — Z7952 Long term (current) use of systemic steroids: Secondary | ICD-10-CM

## 2022-01-31 DIAGNOSIS — Z79899 Other long term (current) drug therapy: Secondary | ICD-10-CM

## 2022-01-31 DIAGNOSIS — Z993 Dependence on wheelchair: Secondary | ICD-10-CM

## 2022-01-31 DIAGNOSIS — G931 Anoxic brain damage, not elsewhere classified: Secondary | ICD-10-CM | POA: Diagnosis not present

## 2022-01-31 DIAGNOSIS — I2699 Other pulmonary embolism without acute cor pulmonale: Secondary | ICD-10-CM | POA: Diagnosis not present

## 2022-01-31 DIAGNOSIS — Z4682 Encounter for fitting and adjustment of non-vascular catheter: Secondary | ICD-10-CM | POA: Diagnosis not present

## 2022-01-31 DIAGNOSIS — Z86718 Personal history of other venous thrombosis and embolism: Secondary | ICD-10-CM

## 2022-01-31 DIAGNOSIS — I2111 ST elevation (STEMI) myocardial infarction involving right coronary artery: Secondary | ICD-10-CM | POA: Diagnosis not present

## 2022-01-31 DIAGNOSIS — G253 Myoclonus: Secondary | ICD-10-CM | POA: Diagnosis present

## 2022-01-31 DIAGNOSIS — E874 Mixed disorder of acid-base balance: Secondary | ICD-10-CM | POA: Diagnosis not present

## 2022-01-31 DIAGNOSIS — S2242XA Multiple fractures of ribs, left side, initial encounter for closed fracture: Secondary | ICD-10-CM

## 2022-01-31 DIAGNOSIS — I2119 ST elevation (STEMI) myocardial infarction involving other coronary artery of inferior wall: Secondary | ICD-10-CM | POA: Diagnosis not present

## 2022-01-31 DIAGNOSIS — Z781 Physical restraint status: Secondary | ICD-10-CM

## 2022-01-31 DIAGNOSIS — J984 Other disorders of lung: Secondary | ICD-10-CM | POA: Diagnosis not present

## 2022-01-31 DIAGNOSIS — R2681 Unsteadiness on feet: Secondary | ICD-10-CM | POA: Diagnosis not present

## 2022-01-31 DIAGNOSIS — R404 Transient alteration of awareness: Secondary | ICD-10-CM | POA: Diagnosis not present

## 2022-01-31 DIAGNOSIS — R262 Difficulty in walking, not elsewhere classified: Secondary | ICD-10-CM | POA: Diagnosis not present

## 2022-01-31 HISTORY — DX: Cardiogenic shock: R57.0

## 2022-01-31 HISTORY — DX: Acute respiratory failure, unspecified whether with hypoxia or hypercapnia: J96.00

## 2022-01-31 HISTORY — DX: Acidosis, unspecified: E87.20

## 2022-01-31 HISTORY — DX: Acute embolism and thrombosis of unspecified deep veins of unspecified lower extremity: I82.409

## 2022-01-31 HISTORY — DX: ST elevation (STEMI) myocardial infarction of unspecified site: I21.3

## 2022-01-31 HISTORY — DX: Personal history of transient ischemic attack (TIA), and cerebral infarction without residual deficits: Z86.73

## 2022-01-31 HISTORY — DX: Multiple fractures of ribs, bilateral, subsequent encounter for fracture with routine healing: S22.43XD

## 2022-01-31 HISTORY — DX: Contusion of lung, unspecified, initial encounter: S27.329A

## 2022-01-31 HISTORY — DX: Hyperglycemia, unspecified: R73.9

## 2022-01-31 HISTORY — DX: Unspecified fracture of sternum, initial encounter for closed fracture: S22.20XA

## 2022-01-31 HISTORY — DX: Pressure ulcer of unspecified site, unspecified stage: L89.90

## 2022-01-31 LAB — COMPREHENSIVE METABOLIC PANEL
ALT: 462 U/L — ABNORMAL HIGH (ref 0–44)
AST: 598 U/L — ABNORMAL HIGH (ref 15–41)
Albumin: 3 g/dL — ABNORMAL LOW (ref 3.5–5.0)
Alkaline Phosphatase: 228 U/L — ABNORMAL HIGH (ref 38–126)
Anion gap: 14 (ref 5–15)
BUN: 23 mg/dL (ref 8–23)
CO2: 19 mmol/L — ABNORMAL LOW (ref 22–32)
Calcium: 8.5 mg/dL — ABNORMAL LOW (ref 8.9–10.3)
Chloride: 107 mmol/L (ref 98–111)
Creatinine, Ser: 1.59 mg/dL — ABNORMAL HIGH (ref 0.61–1.24)
GFR, Estimated: 49 mL/min — ABNORMAL LOW (ref >60)
Glucose, Bld: 236 mg/dL — ABNORMAL HIGH (ref 70–99)
Potassium: 3.9 mmol/L (ref 3.5–5.1)
Sodium: 140 mmol/L (ref 135–145)
Total Bilirubin: 0.5 mg/dL (ref 0.3–1.2)
Total Protein: 6.9 g/dL (ref 6.5–8.1)

## 2022-01-31 LAB — CBC WITH DIFFERENTIAL/PLATELET
Abs Immature Granulocytes: 0.76 10*3/uL — ABNORMAL HIGH (ref 0.00–0.07)
Basophils Absolute: 0.1 10*3/uL (ref 0.0–0.1)
Basophils Relative: 1 %
Eosinophils Absolute: 0.6 10*3/uL — ABNORMAL HIGH (ref 0.0–0.5)
Eosinophils Relative: 4 %
HCT: 46.3 % (ref 39.0–52.0)
Hemoglobin: 13.2 g/dL (ref 13.0–17.0)
Immature Granulocytes: 6 %
Lymphocytes Relative: 50 %
Lymphs Abs: 6.5 10*3/uL — ABNORMAL HIGH (ref 0.7–4.0)
MCH: 26 pg (ref 26.0–34.0)
MCHC: 28.5 g/dL — ABNORMAL LOW (ref 30.0–36.0)
MCV: 91.1 fL (ref 80.0–100.0)
Monocytes Absolute: 0.5 10*3/uL (ref 0.1–1.0)
Monocytes Relative: 4 %
Neutro Abs: 4.5 10*3/uL (ref 1.7–7.7)
Neutrophils Relative %: 35 %
Platelets: 142 10*3/uL — ABNORMAL LOW (ref 150–400)
RBC: 5.08 MIL/uL (ref 4.22–5.81)
RDW: 17 % — ABNORMAL HIGH (ref 11.5–15.5)
WBC: 12.9 10*3/uL — ABNORMAL HIGH (ref 4.0–10.5)
nRBC: 0.5 % — ABNORMAL HIGH (ref 0.0–0.2)

## 2022-01-31 LAB — I-STAT VENOUS BLOOD GAS, ED
Acid-base deficit: 19 mmol/L — ABNORMAL HIGH (ref 0.0–2.0)
Bicarbonate: 13.8 mmol/L — ABNORMAL LOW (ref 20.0–28.0)
Calcium, Ion: 1.12 mmol/L — ABNORMAL LOW (ref 1.15–1.40)
HCT: 42 % (ref 39.0–52.0)
Hemoglobin: 14.3 g/dL (ref 13.0–17.0)
O2 Saturation: 46 %
Potassium: 4.1 mmol/L (ref 3.5–5.1)
Sodium: 138 mmol/L (ref 135–145)
TCO2: 16 mmol/L — ABNORMAL LOW (ref 22–32)
pCO2, Ven: 65.9 mmHg — ABNORMAL HIGH (ref 44–60)
pH, Ven: 6.928 — CL (ref 7.25–7.43)
pO2, Ven: 41 mmHg (ref 32–45)

## 2022-01-31 LAB — RESP PANEL BY RT-PCR (FLU A&B, COVID) ARPGX2
Influenza A by PCR: NEGATIVE
Influenza B by PCR: NEGATIVE
SARS Coronavirus 2 by RT PCR: NEGATIVE

## 2022-01-31 LAB — PROTIME-INR
INR: 1.5 — ABNORMAL HIGH (ref 0.8–1.2)
Prothrombin Time: 17.6 seconds — ABNORMAL HIGH (ref 11.4–15.2)

## 2022-01-31 LAB — HEPARIN LEVEL (UNFRACTIONATED): Heparin Unfractionated: >1.1 IU/mL — ABNORMAL HIGH (ref 0.30–0.70)

## 2022-01-31 LAB — I-STAT ARTERIAL BLOOD GAS, ED
Acid-base deficit: 13 mmol/L — ABNORMAL HIGH (ref 0.0–2.0)
Bicarbonate: 17.8 mmol/L — ABNORMAL LOW (ref 20.0–28.0)
Calcium, Ion: 1.18 mmol/L (ref 1.15–1.40)
HCT: 39 % (ref 39.0–52.0)
Hemoglobin: 13.3 g/dL (ref 13.0–17.0)
O2 Saturation: 100 %
Patient temperature: 97.9
Potassium: 4 mmol/L (ref 3.5–5.1)
Sodium: 136 mmol/L (ref 135–145)
TCO2: 20 mmol/L — ABNORMAL LOW (ref 22–32)
pCO2 arterial: 58 mmHg — ABNORMAL HIGH (ref 32–48)
pH, Arterial: 7.092 — CL (ref 7.35–7.45)
pO2, Arterial: 279 mmHg — ABNORMAL HIGH (ref 83–108)

## 2022-01-31 LAB — CBG MONITORING, ED
Glucose-Capillary: 329 mg/dL — ABNORMAL HIGH (ref 70–99)
Glucose-Capillary: 303 mg/dL — ABNORMAL HIGH (ref 70–99)

## 2022-01-31 LAB — LIPID PANEL
Cholesterol: 165 mg/dL (ref 0–200)
HDL: 19 mg/dL — ABNORMAL LOW (ref >40)
LDL Cholesterol: 90 mg/dL (ref 0–99)
Total CHOL/HDL Ratio: 8.7 RATIO
Triglycerides: 279 mg/dL — ABNORMAL HIGH (ref <150)
VLDL: 56 mg/dL — ABNORMAL HIGH (ref 0–40)

## 2022-01-31 LAB — RAPID URINE DRUG SCREEN, HOSP PERFORMED
Amphetamines: NOT DETECTED
Barbiturates: NOT DETECTED
Benzodiazepines: NOT DETECTED
Cocaine: NOT DETECTED
Opiates: NOT DETECTED
Tetrahydrocannabinol: NOT DETECTED

## 2022-01-31 LAB — TROPONIN I (HIGH SENSITIVITY)
Troponin I (High Sensitivity): 2599 ng/L (ref <18)
Troponin I (High Sensitivity): 7479 ng/L (ref <18)

## 2022-01-31 LAB — ECHOCARDIOGRAM COMPLETE
Height: 68 in
Weight: 2848 oz

## 2022-01-31 LAB — APTT
aPTT: 147 seconds — ABNORMAL HIGH (ref 24–36)
aPTT: 42 seconds — ABNORMAL HIGH (ref 24–36)

## 2022-01-31 LAB — HEMOGLOBIN A1C
Hgb A1c MFr Bld: 5 % (ref 4.8–5.6)
Mean Plasma Glucose: 96.8 mg/dL

## 2022-01-31 LAB — LACTIC ACID, PLASMA
Lactic Acid, Venous: >9 mmol/L (ref 0.5–1.9)
Lactic Acid, Venous: >9 mmol/L (ref 0.5–1.9)

## 2022-01-31 LAB — HIV ANTIBODY (ROUTINE TESTING W REFLEX): HIV Screen 4th Generation wRfx: NONREACTIVE

## 2022-01-31 LAB — GLUCOSE, CAPILLARY: Glucose-Capillary: 162 mg/dL — ABNORMAL HIGH (ref 70–99)

## 2022-01-31 LAB — I-STAT CREATININE, ED: Creatinine, Ser: 1.1 mg/dL (ref 0.61–1.24)

## 2022-01-31 SURGERY — LEFT HEART CATH AND CORONARY ANGIOGRAPHY
Anesthesia: LOCAL

## 2022-01-31 MED ORDER — SODIUM CHLORIDE 0.9 % IV SOLN
INTRAVENOUS | Status: DC
Start: 2022-01-31 — End: 2022-02-03

## 2022-01-31 MED ORDER — FENTANYL 2500MCG IN NS 250ML (10MCG/ML) PREMIX INFUSION
0.0000 ug/h | INTRAVENOUS | Status: DC
  Administered 2022-01-31: 25 ug/h via INTRAVENOUS
  Filled 2022-01-31 (×2): qty 250

## 2022-01-31 MED ORDER — NOREPINEPHRINE 4 MG/250ML-% IV SOLN: 2.0000 ug/min | INTRAVENOUS | Status: DC

## 2022-01-31 MED ORDER — HEPARIN SODIUM (PORCINE) 5000 UNIT/ML IJ SOLN
INTRAMUSCULAR | Status: AC
  Filled 2022-01-31: qty 1

## 2022-01-31 MED ORDER — EPINEPHRINE HCL 5 MG/250ML IV SOLN IN NS
0.5000 ug/min | INTRAVENOUS | Status: DC
  Administered 2022-01-31: 20 ug/min via INTRAVENOUS

## 2022-01-31 MED ORDER — MIDAZOLAM HCL 2 MG/2ML IJ SOLN
2.0000 mg | INTRAMUSCULAR | Status: AC | PRN
  Administered 2022-01-31 (×3): 2 mg via INTRAVENOUS
  Filled 2022-01-31 (×2): qty 2

## 2022-01-31 MED ORDER — HEPARIN SODIUM (PORCINE) 5000 UNIT/ML IJ SOLN
2000.0000 [IU] | Freq: Once | INTRAMUSCULAR | Status: AC
  Administered 2022-01-31: 2000 [IU] via INTRAVENOUS

## 2022-01-31 MED ORDER — PROPOFOL 1000 MG/100ML IV EMUL
5.0000 ug/kg/min | INTRAVENOUS | Status: DC
  Administered 2022-01-31: 5 ug/kg/min via INTRAVENOUS
  Filled 2022-01-31 (×3): qty 100

## 2022-01-31 MED ORDER — FENTANYL CITRATE PF 50 MCG/ML IJ SOSY
25.0000 ug | PREFILLED_SYRINGE | INTRAMUSCULAR | Status: DC | PRN
  Administered 2022-01-31: 50 ug via INTRAVENOUS
  Filled 2022-01-31: qty 1

## 2022-01-31 MED ORDER — ASPIRIN 300 MG RE SUPP
300.0000 mg | Freq: Once | RECTAL | Status: AC
  Administered 2022-01-31: 300 mg via RECTAL
  Filled 2022-01-31: qty 1

## 2022-01-31 MED ORDER — FENTANYL CITRATE PF 50 MCG/ML IJ SOSY: 50.0000 ug | PREFILLED_SYRINGE | INTRAMUSCULAR | Status: DC | PRN

## 2022-01-31 MED ORDER — SODIUM CHLORIDE 0.9 % IV SOLN
3.0000 g | Freq: Four times a day (QID) | INTRAVENOUS | Status: DC
  Administered 2022-01-31 – 2022-02-01 (×4): 3 g via INTRAVENOUS
  Filled 2022-01-31 (×4): qty 8

## 2022-01-31 MED ORDER — ROCURONIUM BROMIDE 50 MG/5ML IV SOLN
INTRAVENOUS | Status: AC | PRN
  Administered 2022-01-31: 80 mg via INTRAVENOUS

## 2022-01-31 MED ORDER — CHLORHEXIDINE GLUCONATE CLOTH 2 % EX PADS
6.0000 | MEDICATED_PAD | Freq: Every day | CUTANEOUS | Status: DC
Start: 2022-01-31 — End: 2022-02-03
  Administered 2022-02-01 – 2022-02-02 (×2): 6 via TOPICAL

## 2022-01-31 MED ORDER — ORAL CARE MOUTH RINSE: 15.0000 mL | OROMUCOSAL | Status: DC | PRN

## 2022-01-31 MED ORDER — IOHEXOL 350 MG/ML SOLN
100.0000 mL | Freq: Once | INTRAVENOUS | Status: AC | PRN
  Administered 2022-01-31: 100 mL via INTRAVENOUS

## 2022-01-31 MED ORDER — NOREPINEPHRINE 4 MG/250ML-% IV SOLN
0.0000 ug/min | INTRAVENOUS | Status: AC
  Administered 2022-02-01 (×2): 18 ug/min via INTRAVENOUS
  Filled 2022-01-31 (×4): qty 250

## 2022-01-31 MED ORDER — SODIUM CHLORIDE 0.9 % IV SOLN: 250.0000 mL | INTRAVENOUS | Status: DC

## 2022-01-31 MED ORDER — ETOMIDATE 2 MG/ML IV SOLN
INTRAVENOUS | Status: AC | PRN
  Administered 2022-01-31: 20 mg via INTRAVENOUS

## 2022-01-31 MED ORDER — ACETAMINOPHEN 160 MG/5ML PO SOLN: 650.0000 mg | ORAL | Status: DC | PRN

## 2022-01-31 MED ORDER — ACETAMINOPHEN 160 MG/5ML PO SOLN
650.0000 mg | ORAL | Status: DC
  Administered 2022-01-31 – 2022-02-01 (×3): 650 mg
  Filled 2022-01-31 (×3): qty 20.3

## 2022-01-31 MED ORDER — PANTOPRAZOLE 2 MG/ML SUSPENSION
40.0000 mg | Freq: Every day | ORAL | Status: DC
  Administered 2022-01-31: 40 mg
  Filled 2022-01-31 (×2): qty 20

## 2022-01-31 MED ORDER — POLYETHYLENE GLYCOL 3350 17 G PO PACK
17.0000 g | PACK | Freq: Every day | ORAL | Status: DC
  Administered 2022-01-31: 17 g
  Filled 2022-01-31: qty 1

## 2022-01-31 MED ORDER — MIDAZOLAM HCL 2 MG/2ML IJ SOLN
2.0000 mg | INTRAMUSCULAR | Status: DC | PRN
  Administered 2022-01-31: 1 mg via INTRAVENOUS
  Administered 2022-02-01: 2 mg via INTRAVENOUS
  Filled 2022-01-31 (×4): qty 2

## 2022-01-31 MED ORDER — HEPARIN (PORCINE) 25000 UT/250ML-% IV SOLN: 1300.0000 [IU]/h | INTRAVENOUS | Status: DC

## 2022-01-31 MED ORDER — ACETAMINOPHEN 325 MG PO TABS
650.0000 mg | ORAL_TABLET | ORAL | Status: DC
  Administered 2022-02-01 – 2022-02-02 (×4): 650 mg via ORAL
  Filled 2022-01-31 (×3): qty 2

## 2022-01-31 MED ORDER — BUSPIRONE HCL 10 MG PO TABS: 30.0000 mg | ORAL_TABLET | Freq: Three times a day (TID) | ORAL | Status: DC | PRN

## 2022-01-31 MED ORDER — MAGNESIUM SULFATE 2 GM/50ML IV SOLN: 2.0000 g | Freq: Once | INTRAVENOUS | Status: DC | PRN

## 2022-01-31 MED ORDER — EPINEPHRINE HCL 5 MG/250ML IV SOLN IN NS
INTRAVENOUS | Status: AC
  Filled 2022-01-31: qty 250

## 2022-01-31 MED ORDER — DOCUSATE SODIUM 50 MG/5ML PO LIQD
100.0000 mg | Freq: Two times a day (BID) | ORAL | Status: DC
Start: 2022-01-31 — End: 2022-02-02
  Administered 2022-01-31: 100 mg
  Filled 2022-01-31 (×2): qty 10

## 2022-01-31 MED ORDER — CLOPIDOGREL BISULFATE 75 MG PO TABS
75.0000 mg | ORAL_TABLET | Freq: Every day | ORAL | Status: DC
Start: 2022-01-31 — End: 2022-02-02
  Administered 2022-01-31: 75 mg
  Filled 2022-01-31: qty 1

## 2022-01-31 MED ORDER — HEPARIN SODIUM (PORCINE) 5000 UNIT/ML IJ SOLN
4000.0000 [IU] | Freq: Once | INTRAMUSCULAR | Status: AC
  Administered 2022-01-31: 4000 [IU] via INTRAVENOUS

## 2022-01-31 MED ORDER — ORAL CARE MOUTH RINSE
15.0000 mL | OROMUCOSAL | Status: DC
  Administered 2022-01-31 – 2022-02-01 (×12): 15 mL via OROMUCOSAL

## 2022-01-31 MED ORDER — SODIUM CHLORIDE 0.9 % IV SOLN
250.0000 mL | INTRAVENOUS | Status: DC
  Administered 2022-01-31: 250 mL via INTRAVENOUS

## 2022-01-31 MED ORDER — ACETAMINOPHEN 325 MG PO TABS: 650.0000 mg | ORAL_TABLET | ORAL | Status: DC | PRN

## 2022-01-31 MED ORDER — HEPARIN (PORCINE) 25000 UT/250ML-% IV SOLN
2250.0000 [IU]/h | INTRAVENOUS | Status: DC
  Administered 2022-01-31: 1000 [IU]/h via INTRAVENOUS
  Administered 2022-02-02 – 2022-02-03 (×2): 1450 [IU]/h via INTRAVENOUS
  Administered 2022-02-03: 1750 [IU]/h via INTRAVENOUS
  Administered 2022-02-04: 2250 [IU]/h via INTRAVENOUS
  Administered 2022-02-04: 2000 [IU]/h via INTRAVENOUS
  Administered 2022-02-05 (×2): 2250 [IU]/h via INTRAVENOUS
  Filled 2022-01-31 (×9): qty 250

## 2022-01-31 MED ORDER — STERILE WATER FOR INJECTION IV SOLN
INTRAVENOUS | Status: DC
  Filled 2022-01-31 (×3): qty 1000

## 2022-01-31 MED ORDER — ONDANSETRON HCL 4 MG/2ML IJ SOLN
4.0000 mg | Freq: Four times a day (QID) | INTRAMUSCULAR | Status: DC | PRN
  Administered 2022-02-02 – 2022-02-05 (×2): 4 mg via INTRAVENOUS
  Filled 2022-01-31 (×2): qty 2

## 2022-01-31 MED ORDER — FENTANYL CITRATE PF 50 MCG/ML IJ SOSY
50.0000 ug | PREFILLED_SYRINGE | INTRAMUSCULAR | Status: AC | PRN
  Administered 2022-01-31 (×3): 50 ug via INTRAVENOUS
  Filled 2022-01-31 (×3): qty 1

## 2022-01-31 MED ORDER — CHLORHEXIDINE GLUCONATE 0.12 % MT SOLN
15.0000 mL | Freq: Once | OROMUCOSAL | Status: DC
  Filled 2022-01-31: qty 15

## 2022-01-31 MED ORDER — NOREPINEPHRINE 4 MG/250ML-% IV SOLN
INTRAVENOUS | Status: AC
  Administered 2022-01-31: 10 mg via INTRAVENOUS
  Filled 2022-01-31: qty 250

## 2022-01-31 MED ORDER — ACETAMINOPHEN 650 MG RE SUPP: 650.0000 mg | RECTAL | Status: DC | PRN

## 2022-01-31 MED ORDER — NOREPINEPHRINE 4 MG/250ML-% IV SOLN
0.0000 ug/min | INTRAVENOUS | Status: DC
  Administered 2022-01-31: 2 ug/min via INTRAVENOUS
  Administered 2022-01-31: 10 ug/min via INTRAVENOUS

## 2022-01-31 MED ORDER — EPINEPHRINE HCL 5 MG/250ML IV SOLN IN NS: 0.0000 ug/min | INTRAVENOUS | Status: DC

## 2022-01-31 MED ORDER — ACETAMINOPHEN 650 MG RE SUPP: 650.0000 mg | RECTAL | Status: DC

## 2022-01-31 MED ORDER — INSULIN ASPART 100 UNIT/ML IJ SOLN
0.0000 [IU] | INTRAMUSCULAR | Status: DC
  Administered 2022-01-31: 3 [IU] via SUBCUTANEOUS
  Administered 2022-02-01 (×4): 2 [IU] via SUBCUTANEOUS
  Administered 2022-02-02: 3 [IU] via SUBCUTANEOUS
  Administered 2022-02-02 – 2022-02-03 (×3): 2 [IU] via SUBCUTANEOUS

## 2022-01-31 NOTE — Progress Notes (Signed)
Patient was intubated by ED MD without complications.  Positive color change noted.  Bilateral breath sounds auscultated.  Chest xray pended for tube placement.  Will continue to monitor.

## 2022-01-31 NOTE — ED Triage Notes (Signed)
Pt BIB EMS as post CPR. Pt was sitting in wheelchair and had witnessed arrest by family. Pt was complaining of pain earlier and took percocet. EMS did CPR for 62mn. Pt was in vfib and was shocked 3 times, 3epis given, 300 on amniodorone, Pt being paced in sinus brady on arrival.

## 2022-01-31 NOTE — Progress Notes (Signed)
Pt was transferred to 2H15 via Ventilator from ED with no apparent complications. Pt is currently stable with no signs of distress.

## 2022-01-31 NOTE — Progress Notes (Addendum)
Sister Orlinda Blalock spoke via phone  She is his only family  After our discussion she has agreed to full DNR but to continue current medical therapies.   In addition a back up contact would be Mr Johnney Ou but he is not family and not able to make medical decisions [phone] [address] ACNP-BC [place] Pager # [phone] OR # [phone] if no answer

## 2022-01-31 NOTE — ED Notes (Signed)
Echo and family at bedside.

## 2022-01-31 NOTE — Progress Notes (Signed)
Dear Doctor: This patient has been identified as a candidate for PICC/CVC for the following reason (s): IV therapy over 48 hours, drug extravasation potential with tissue necrosis (KCL, Dilantin, Dopamine, CaCl, MgSO4, chemo vesicant), poor veins/poor circulatory system (CHF, COPD, emphysema, diabetes, steroid use, IV drug abuse, etc.), restarts due to phlebitis and infiltration in 24 hours, and incompatible drugs (aminophyllin, TPN, heparin, given with an antibiotic) If you agree, please write an order for the indicated device.   Thank you for supporting the early vascular access assessment program. 

## 2022-01-31 NOTE — Progress Notes (Signed)
ANTICOAGULATION CONSULT NOTE - Initial Consult  Pharmacy Consult for Heparin infusion Indication:  STEMI  Allergies  Allergen Reactions   Dilaudid [Hydromorphone Hcl] Rash   Ramipril Rash and Other (See Comments)    Per patient on R arm and leg only; no angioedema   Tramadol Itching and Rash    Patient Measurements: Height: '5\' 8"'$  (172.7 cm) IBW/kg (Calculated) : 68.4 Heparin Dosing Weight: 80 kg  Vital Signs: BP: 159/89 (08/08 1257) Pulse Rate: 80 (08/08 1258)  Labs: Recent Labs    01/31/22 1236  HGB 14.3  HCT 42.0    CrCl cannot be calculated (Unknown ideal weight.).   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   Hypertension    states is borderline on medication; has been on med. x 5-6 yr.   Hypokalemia 05/30/2018   Impingement syndrome of shoulder region 10/2014   left   Insomnia 06/01/2015   Ischemic ulcer of toe of left foot (HCC)    Left shoulder pain 12/16/2015   Low back pain without sciatica 10/29/2009   Lupus ([place])    Peripheral vascular disease (HCC)    Pneumonia    RA (rheumatoid arthritis) ([place])    Rotator cuff tear 11/04/2014    Medications:  (Not in a hospital admission)   Assessment: 61 yo M BIBEMS s/p CPR with ROSC after 30 minutes. ECG on presentation shows STEMI. Per dispense history and chart review, pt was initiated on apixaban for treatment of VTE on 01/23/22. Pharmacy consulted to dose heparin infusion for STEMI/ACS protocol.   Goal of Therapy:  Heparin level 0.3-0.7 units/ml aPTT 66-102 seconds Monitor platelets by anticoagulation protocol: Yes   Plan:  Heparin 4000 units x1 bolus, followed by  Heparin infusion at 1000 units/hr Check aPTT and heparin  level in 6 hours  Monitor daily CBC, heparin level, aPTT and s/sx of bleeding.  Will need to monitor aPTT with heparin levels until correlating due to home apixaban regimen.   Kaleen Mask 01/31/2022,1:14 PM

## 2022-01-31 NOTE — Progress Notes (Signed)
ANTICOAGULATION CONSULT NOTE   Pharmacy Consult for Heparin infusion Indication:  STEMI  and b/l PE  Allergies  Allergen Reactions   Dilaudid [Hydromorphone Hcl] Rash   Ramipril Rash and Other (See Comments)    Per patient on R arm and leg only; no angioedema   Tramadol Itching and Rash    Patient Measurements: Height: '5\' 8"'$  (172.7 cm) Weight: 80.7 kg (178 lb) IBW/kg (Calculated) : 68.4 Heparin Dosing Weight: 80 kg  Vital Signs: Temp: 100.9 F (38.3 C) (08/08 2115) BP: 101/80 (08/08 2115) Pulse Rate: 89 (08/08 2115)  Labs: Recent Labs    01/31/22 1236 01/31/22 1306 01/31/22 1340 01/31/22 1354 01/31/22 1426 01/31/22 1843 01/31/22 2041  HGB 14.3 13.2  --   --  13.3  --   --   HCT 42.0 46.3  --   --  39.0  --   --   PLT  --  142*  --   --   --   --   --   APTT  --   --  147*  --   --   --  42*  LABPROT  --   --  17.6*  --   --   --   --   INR  --   --  1.5*  --   --   --   --   HEPARINUNFRC  --   --   --   --   --  >1.10*  --   CREATININE  --  1.36*  --  1.10  --  1.59*  --   TROPONINIHS  --  2,599*  --   --   --  7,479*  --      Estimated Creatinine Clearance: 47.2 mL/min (A) (by C-G formula based on SCr of 1.59 mg/dL (H)).   Assessment: 61 yo M BIBEMS s/p CPR with ROSC after 30 minutes. ECG on presentation shows STEMI. Per dispense history and chart review, pt was initiated on apixaban for treatment of VTE on 01/23/22. Unsure last dose. Apixaban affecting heparin levels (>1.1) - utillizing aPTT for monitoring until levels correlate.  Pt on heparin gtt for STEMI and also with b/l PE on CT. aPTT 42 sec (subtherapeutic) on infusion at 1000 units/hr. No issues with line or bleeding reported per RN.  Goal of Therapy:  Heparin level 0.3-0.7 units/ml aPTT 66-102 seconds Monitor platelets by anticoagulation protocol: Yes   Plan:  Rebolus heparin 2000 units Increase heparin infusion to 1300 units/hr F/u aPTT and heparin level in 6 hours   Sherlon Handing, PharmD,  BCPS Please see amion for complete clinical pharmacist phone list 01/31/2022,9:22 PM

## 2022-01-31 NOTE — Progress Notes (Signed)
Pharmacy Antibiotic Note  [name] [name] is a 61 y.o. male admitted on 01/31/2022. Pharmacy has been consulted for unasyn dosing. Pt is afebrile and WBC is elevated at 12.9. SCr is WNL at 1.1 and lactic acid is elevated.   Plan: Unasyn 3g IV Q6H F/u renal fxn, C&S, clinical status  Height: '5\' 8"'$  (172.7 cm) Weight: 80.7 kg (178 lb) IBW/kg (Calculated) : 68.4  Temp (24hrs), Avg:95.7 F (35.4 C), Min:92.9 F (33.8 C), Max:97.5 F (36.4 C)  Recent Labs  Lab 01/31/22 1300 01/31/22 1306 01/31/22 1354  WBC  --  12.9*  --   CREATININE  --  1.36* 1.10  LATICACIDVEN >9.0* >9.0*  --     Estimated Creatinine Clearance: 68.2 mL/min (by C-G formula based on SCr of 1.1 mg/dL).    Allergies  Allergen Reactions   Dilaudid [Hydromorphone Hcl] Rash   Ramipril Rash and Other (See Comments)    Per patient on R arm and leg only; no angioedema   Tramadol Itching and Rash    Antimicrobials this admission: Unasyn 8/8>>  Dose adjustments this admission: N/A  Microbiology results: Pending  Thank you for allowing pharmacy to be a part of this patient's care.  Dylanie Quesenberry, Rande Lawman 01/31/2022 2:53 PM

## 2022-01-31 NOTE — ED Provider Notes (Addendum)
[facility] EMERGENCY DEPARTMENT Provider Note   CSN: [id number] Arrival date & time: 01/31/22  1223     History  Chief Complaint  Patient presents with   CPR   POST cpr    [name] [name] is a 61 y.o. male.  HPI 61 year old male presents status post cardiac arrest.  History is initially from EMS.  He was in his wheelchair and became unresponsive, was witnessed by family.  Does not seem like they started bystander CPR.  First rhythm was V-fib.  He was shocked 3 separate times for V-fib.  He had ROSC during the middle of CPR once or twice but overall had around 30 minutes of CPR in total and received 3 epinephrines.  Currently on an epinephrine drip.  He is also being paced by EMS.  Home Medications Prior to Admission medications   Medication Sig Start Date End Date Taking? Authorizing Provider  amLODipine (NORVASC) 10 MG tablet TAKE 1 TABLET BY MOUTH EVERY DAY 05/23/21   Jose Persia, MD  APIXABAN Arne [place]) VTE STARTER PACK ('10MG'$  AND '5MG'$ ) Take as directed on package: start with two-'5mg'$  tablets twice daily for 7 days. On day 8, switch to one-'5mg'$  tablet twice daily. 01/10/22   Tonye Pearson, PA-C  atorvastatin (LIPITOR) 80 MG tablet Take 1 tablet (80 mg total) by mouth daily at 6 PM. 11/06/19   Oda Kilts, MD  chlorthalidone (HYGROTON) 25 MG tablet TAKE 1 TABLET BY MOUTH EVERY DAY 09/27/21   Demaio, Alexa, MD  clopidogrel (PLAVIX) 75 MG tablet Take 1 tablet (75 mg total) by mouth daily. 07/18/21 07/18/22  Delene Ruffini, MD  diclofenac Sodium (VOLTAREN) 1 % GEL Apply 2-4 g topically in the morning, at noon, in the evening, and at bedtime. 12/26/21   [provider]  docusate sodium (COLACE) 100 MG capsule Take 1 capsule (100 mg total) by mouth daily. 01/27/21   Madalyn Rob, MD  doxycycline (VIBRAMYCIN) 100 MG capsule Take 1 capsule (100 mg total) by mouth 2 (two) times daily. 01/27/21   Madalyn Rob, MD  DULoxetine (CYMBALTA) 30 MG capsule Take 30 mg by  mouth daily. 12/26/21   [provider]  gabapentin (NEURONTIN) 100 MG capsule Take 100 mg by mouth at bedtime as needed (for pain).  12/31/19   [provider]  gabapentin (NEURONTIN) 300 MG capsule Take 300 mg by mouth 2 (two) times daily. 08/02/21   [provider]  Glycerin-Polysorbate 80 (REFRESH DRY EYE THERAPY OP) Place 1 drop into both eyes 3 (three) times daily as needed (for dryness or irritation).     [provider]  ibuprofen (ADVIL) 200 MG tablet Take 200-600 mg by mouth every 6 (six) hours as needed for headache or mild pain.    [provider]  naloxone [facility]) 2 MG/2ML injection Inject 2 mg into the muscle once as needed (opiod overdose (call 911, inject intramuscularly in shoulder or thigh. Repeat every 3 minutes)).     [provider]  predniSONE (DELTASONE) 10 MG tablet TAKE 1 TABLET BY MOUTH EVERY DAY 03/22/21   Christian, Rylee, MD  varenicline (CHANTIX PAK) 0.5 MG X 11 & 1 MG X 42 tablet TAKE ONE 0.5 MG TABLET BY MOUTH ONCE DAILY FOR 3 DAYS, THEN INCREASE TO ONE 0.5 MG TABLET TWICE DAILY FOR 4 DAYS, THEN INCREASE TO ONE 1 MG TABLET TWICE DAILY. 04/05/21   Virl Axe, MD  varenicline (CHANTIX) 1 MG tablet TAKE 1 TABLET BY MOUTH TWICE A DAY  04/05/21   Virl Axe, MD      Allergies    Dilaudid [hydromorphone hcl], Ramipril, and Tramadol    Review of Systems   Review of Systems  Unable to perform ROS: Patient unresponsive    Physical Exam Updated Vital Signs BP (!) 147/94   Pulse (!) 114   Temp (!) 96.8 F (36 C)   Resp (!) 24   Ht '5\' 8"'$  (1.727 m)   SpO2 98%   BMI 27.06 kg/m  Physical Exam Vitals and nursing note reviewed.  Constitutional:      Appearance: He is well-developed. He is ill-appearing.     Interventions: He is intubated (with king airway).  HENT:     Head: Normocephalic and atraumatic.  Eyes:     Comments: Pupils mid-sized, not reactive  Cardiovascular:     Rate and Rhythm: Normal rate and  regular rhythm.     Pulses:          Femoral pulses are 2+ on the right side and 2+ on the left side.    Heart sounds: Normal heart sounds.  Pulmonary:     Effort: Bradypnea present. He is intubated (with king airway).     Breath sounds: Rhonchi present.  Abdominal:     General: There is no distension.     Palpations: Abdomen is soft.  Musculoskeletal:     Comments: Left AKA Right leg without obvious swelling  Skin:    General: Skin is warm and dry.  Neurological:     Mental Status: He is unresponsive.     GCS: GCS eye subscore is 1. GCS verbal subscore is 1. GCS motor subscore is 1.     ED Results / Procedures / Treatments   Labs (all labs ordered are listed, but only abnormal results are displayed) Labs Reviewed  CBC WITH DIFFERENTIAL/PLATELET - Abnormal; Notable for the following components:      Result Value   WBC 12.9 (*)    MCHC 28.5 (*)    RDW 17.0 (*)    Platelets 142 (*)    nRBC 0.5 (*)    All other components within normal limits  CBG MONITORING, ED - Abnormal; Notable for the following components:   Glucose-Capillary 329 (*)    All other components within normal limits  I-STAT VENOUS BLOOD GAS, ED - Abnormal; Notable for the following components:   pH, Ven 6.928 (*)    pCO2, Ven 65.9 (*)    Bicarbonate 13.8 (*)    TCO2 16 (*)    Acid-base deficit 19.0 (*)    Calcium, Ion 1.12 (*)    All other components within normal limits  RESP PANEL BY RT-PCR (FLU A&B, COVID) ARPGX2  CULTURE, BLOOD (ROUTINE X 2)  CULTURE, BLOOD (ROUTINE X 2)  HEMOGLOBIN A1C  COMPREHENSIVE METABOLIC PANEL  LACTIC ACID, PLASMA  LACTIC ACID, PLASMA  RAPID URINE DRUG SCREEN, HOSP PERFORMED  HEPARIN LEVEL (UNFRACTIONATED)  APTT  LIPID PANEL  PROTIME-INR  APTT  BLOOD GAS, ARTERIAL  I-STAT ARTERIAL BLOOD GAS, ED  I-STAT CREATININE, ED  TROPONIN I (HIGH SENSITIVITY)    EKG EKG Interpretation  Date/Time:  Tuesday January 31 2022 12:34:51 EDT Ventricular Rate:  61 PR  Interval:  175 QRS Duration: 115 QT Interval:  383 QTC Calculation: 386 R Axis:   -51 Text Interpretation: Sinus rhythm Consider left atrial enlargement IRBBB and LPFB inferior STEMI Confirmed by Sherwood Gambler [phone]) on 01/31/2022 12:40:39 PM  Radiology DG Chest [facility] 1 View  Result  Date: 01/31/2022 CLINICAL DATA:  Post CPR EXAM: PORTABLE CHEST 1 VIEW COMPARISON:  Chest x-ray 06/26/2020 FINDINGS: Endotracheal tube in good position. NG tube in the stomach with the tip at the GE junction. Atherosclerotic calcification aortic arch. Right lung is clear. Mild left upper lobe and left lower lobe airspace disease. No left effusion. No acute skeletal abnormality IMPRESSION: Endotracheal tube in good position. NG tip in this stomach with the side hole in the distal esophagus. Mild airspace disease left upper lobe and left lower lobe. Possible atelectasis or aspiration. Electronically Signed   By: Franchot Gallo M.D.   On: 01/31/2022 12:50    Procedures .Critical Care  Performed by: Sherwood Gambler, MD Authorized by: Sherwood Gambler, MD   Critical care provider statement:    Critical care time (minutes):  40   Critical care time was exclusive of:  Separately billable procedures and treating other patients   Critical care was necessary to treat or prevent imminent or life-threatening deterioration of the following conditions:  Shock, respiratory failure, cardiac failure, circulatory failure and CNS failure or compromise   Critical care was time spent personally by me on the following activities:  Development of treatment plan with patient or surrogate, discussions with consultants, evaluation of patient's response to treatment, examination of patient, ordering and review of laboratory studies, ordering and review of radiographic studies, ordering and performing treatments and interventions, pulse oximetry, re-evaluation of patient's condition, review of old charts and ventilator management      Medications Ordered in ED Medications  norepinephrine (LEVOPHED) '4mg'$  in 258m (0.016 mg/mL) premix infusion (0 mcg/min Intravenous Stopped 01/31/22 1300)  0.9 %  sodium chloride infusion ( Intravenous Not Given 01/31/22 1314)  EPINEPHrine (ADRENALIN) 5 mg in NS 250 mL (0.02 mg/mL) premix infusion (3 mcg/min Intravenous Rate/Dose Change 01/31/22 1337)  fentaNYL (SUBLIMAZE) injection 50 mcg (50 mcg Intravenous Given 01/31/22 1327)  fentaNYL (SUBLIMAZE) injection 50-200 mcg (has no administration in time range)  midazolam (VERSED) injection 2 mg (has no administration in time range)  midazolam (VERSED) injection 2 mg (has no administration in time range)  sodium bicarbonate 150 mEq in sterile water 1,150 mL infusion ( Intravenous New Bag/Given 01/31/22 1333)  heparin ADULT infusion 100 units/mL (25000 units/2557m (1,000 Units/hr Intravenous New Bag/Given 01/31/22 1336)  heparin injection 4,000 Units (4,000 Units Intravenous Given 01/31/22 1237)  aspirin suppository 300 mg (300 mg Rectal Given 01/31/22 1250)  etomidate (AMIDATE) injection (20 mg Intravenous Given 01/31/22 1236)  rocuronium (ZEMURON) injection (80 mg Intravenous Given 01/31/22 1235)    ED Course/ Medical Decision Making/ A&P Clinical Course as of 01/31/22 1345  Tue Jan 31, 2022  1300 I discussed with Dr. BeGwenlyn FoundNo cath lab at this time, appears too unstable. Advise heparin, ICU admit, consider cath later if he wakes up/improves. [SG]  1348/w Dr. McLake BellsAgrees with CT head given down time, CTA of chest given recent DVT history. Will come to admit [SG]    Clinical Course User Index [SG] GoSherwood GamblerMD                           Medical Decision Making Amount and/or Complexity of Data Reviewed Independent Historian: EMS Labs: ordered.    Details: Mixed respiratory and metabolic acidosis, pH 6.9.  Fingerstick glucose is hyperglycemic. Radiology: ordered and independent interpretation performed.    Details: Chest x-ray with good  endotracheal tube position. ECG/medicine tests: ordered and independent interpretation performed.  Details: Inferior STEMI  Risk OTC drugs. Prescription drug management. Decision regarding hospitalization.   Patient presents after cardiac arrest. He is currently comatose. Was intubated for airway protection. Was breathing on his own but otherwise GCS 3. Was hypotensive initially, so epi drip continued from EMS and levophed ordered. Was also given 1L IVF.   EKG is showing STEMI, so code STEMI called and cardiology evaluated at bedside. D/w Dr. Gwenlyn Found, at this time based on current degree of illness, ~30 min of CPR and current coma, he does not think he needs to go emergently to cath lab. Agrees with IV heparin. Cards will follow.   Discussed with ICU, Dr. [place] Bells. As above they will admit, and we will get CT head and CT angio chest based on recent DVT (though started on eliquis.   Respiratory rate on vent put up to 24 given the mixed respiratory and metabolic acidosis.  Also started on bicarbonate drip.  Throughout his ED stay he was able to come off pressors and was requiring sedation.  Admit to ICU in critical condition.   Addendum: Patient's CT head/CTA chest personally viewed/interpreted. No head bleed. Small PEs, but unlikely to have caused the cardiac arrest. Discussed with radiology.    Final Clinical Impression(s) / ED Diagnoses Final diagnoses:  Cardiac arrest ([place])  ST elevation myocardial infarction (STEMI), unspecified artery ([place])  Acute respiratory failure with hypoxia and hypercapnia [facility])    Rx / DC Orders ED Discharge Orders     None         Sherwood Gambler, MD 01/31/22 1349    Sherwood Gambler, MD 01/31/22 1500

## 2022-01-31 NOTE — Progress Notes (Signed)
  Echocardiogram 2D Echocardiogram has been performed.  [name] [name] 01/31/2022, 4:00 PM

## 2022-01-31 NOTE — Consult Note (Addendum)
Cardiology Consult:   Patient ID: [name] [name] MRN: [id number]; DOB: 10/16/1960   Admission date: 01/31/2022  PCP:  Angelique Blonder, DO   [facility] Providers Cardiologist:  Candee Furbish, MD   Chief Complaint:  cardiac arrest  Patient Profile:   [name] [name] is a 61 y.o. male with PAD s/p right AKA and multiple prior interventions, nonobstructive CAD, mild to moderate AI,  and Libman-Sacks endocarditis (echo 2013), and RA who is being seen 01/31/2022 for the evaluation of cardiac arrest at the request of Dr. Regenia Skeeter.  History of Present Illness:   [name] [name] has a significant history of PAD with multiple prior interventions with VVS and left AKA. He had a heart cath in 2018 that showed only mild nonobstructive disease. Echo 2013 with suspicion for Libman-Sacks endocarditis. He is a smoker, in a wheelchair.  He was seen 01/10/22 with acute right popliteal and tibial DVT and started on eliquis treatment dose.   Today, per EMS, he complained of pain, unclear location of pain, and took a "pain pill" - unclear if this was an opioid or gabapentin. He subsequently was witnessed to slump in his wheelchair and was unresponsive. Family worked to get him out of his wheelchair to the floor, question delay in starting CPR. Per EMS, pt received 35 minutes of CPR with initial rhythm Vifb. He received three shocks, 3 rounds of epinephrine, and 300 mg amiodarone. On arrival, he was sinus bradycardia. EKG showed ST elevation in inferior leads concerning for STEMI. CODE STEMI was activated. On exam, pt found with left AKA.    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   Hypertension    states is borderline on medication; has been on med. x  5-6 yr.   Hypokalemia 05/30/2018   Impingement syndrome of shoulder region 10/2014   left   Insomnia 06/01/2015   Ischemic ulcer of toe of left foot (HCC)    Left shoulder pain 12/16/2015   Low back pain without sciatica 10/29/2009   Lupus ([place])    Peripheral vascular disease (HCC)    Pneumonia    RA (rheumatoid arthritis) ([place])    Rotator cuff tear 11/04/2014    Past Surgical History:  Procedure Laterality Date   ABDOMINAL AORTAGRAM  02/20/2019   ABDOMINAL AORTOGRAM W/LOWER EXTREMITY N/A 02/20/2019   Procedure: ABDOMINAL AORTOGRAM W/LOWER EXTREMITY;  Surgeon: Marty Heck, MD;  Location: [facility];  Service: Cardiovascular;  Laterality: N/A;   ACHILLES TENDON SURGERY Bilateral    AMPUTATION Left 05/11/2020   Procedure: LEFT ABOVE KNEE AMPUTATION;  Surgeon: Angelia Mould, MD;  Location: [place];  Service: Vascular;  Laterality: Left;   ENDARTERECTOMY POPLITEAL Left 05/01/2020   Procedure: ENDARTERECTOMY POPLITEAL;  Surgeon: Waynetta Sandy, MD;  Location: [place];  Service: Vascular;  Laterality: Left;   FASCIOTOMY Left 05/01/2020   Procedure: FASCIOTOMY;  Surgeon: Waynetta Sandy, MD;  Location: [place];  Service: Vascular;  Laterality: Left;   FEMORAL-POPLITEAL BYPASS GRAFT Left 02/26/2019   Procedure: BYPASS GRAFT FEMORAL-POPLITEAL ARTERY LEFT LEG USING 63m PROPATEN GRAFT;  Surgeon: BSerafina Mitchell MD;  Location[place]  Service: Vascular;  Laterality:  Left;   INTRAOPERATIVE ARTERIOGRAM Left 01/22/2020   Procedure: INTRA OPERATIVE ARTERIOGRAM with administration of thrombolyics in Left femoral to popliteal bypass.;  Surgeon: Marty Heck, MD;  Location: [place];  Service: Vascular;  Laterality: Left;   LEFT HEART CATH AND CORONARY ANGIOGRAPHY N/A 03/15/2017   Procedure: LEFT HEART CATH AND CORONARY ANGIOGRAPHY;  Surgeon: Nelva Bush, MD;  Location: [place] [facility];  Service: Cardiovascular;  Laterality: N/A;   LOWER EXTREMITY INTERVENTION  Left 04/29/2020   Procedure: LOWER EXTREMITY INTERVENTION- LYSIS;  Surgeon: Marty Heck, MD;  Location: [place] [facility];  Service: Cardiovascular;  Laterality: Left;   OSTEOTOMY AND ULNAR SHORTENING Right 07/22/2002   PATCH ANGIOPLASTY Left 05/01/2020   Procedure: PATCH ANGIOPLASTY USING HEMASHIELD PLATINUM FINESSE PATCH;  Surgeon: Waynetta Sandy, MD;  Location: [place];  Service: Vascular;  Laterality: Left;   PERIPHERAL VASCULAR ATHERECTOMY  01/23/2020   Procedure: PERIPHERAL VASCULAR ATHERECTOMY;  Surgeon: Marty Heck, MD;  Location: [place];  Service: Cardiovascular;;   PERIPHERAL VASCULAR BALLOON ANGIOPLASTY  01/23/2020   Procedure: PERIPHERAL VASCULAR BALLOON ANGIOPLASTY;  Surgeon: Marty Heck, MD;  Location: [place] [facility];  Service: Cardiovascular;;   PERIPHERAL VASCULAR INTERVENTION Left 04/30/2020   Procedure: PERIPHERAL VASCULAR INTERVENTION;  Surgeon: Cherre Robins, MD;  Location: [place] [facility];  Service: Cardiovascular;  Laterality: Left;   PERIPHERAL VASCULAR THROMBECTOMY N/A 01/23/2020   Procedure: lysis recheck;  Surgeon: Marty Heck, MD;  Location: [place] [facility];  Service: Cardiovascular;  Laterality: N/A;  + [place]    PERIPHERAL VASCULAR THROMBECTOMY N/A 04/30/2020   Procedure: Lysis Recheck;  Surgeon: Cherre Robins, MD;  Location: [facility];  Service: Cardiovascular;  Laterality: N/A;   SHOULDER ARTHROSCOPY WITH BICEPSTENOTOMY Right 03/11/2015   Procedure: SHOULDER ARTHROSCOPY WITH BICEPSTENOTOMY;  Surgeon: Ninetta Lights, MD;  Location: [place];  Service: Orthopedics;  Laterality: Right;   SHOULDER ARTHROSCOPY WITH DISTAL CLAVICLE RESECTION Left 11/19/2014   Procedure: SHOULDER ARTHROSCOPY WITH DISTAL CLAVICLE RESECTION;  Surgeon: Kathryne Hitch, MD;  Location: [place];  Service: Orthopedics;  Laterality: Left;   SHOULDER ARTHROSCOPY WITH ROTATOR CUFF REPAIR AND  SUBACROMIAL DECOMPRESSION Left 11/19/2014   Procedure: LEFT SHOULDER ARTHROSCOPY, DEBRIDEMENT DISTAL CLAVICLE EXCISION, ACROMIOPLASTY WITH ROTATOR CUFF REPAIR ;  Surgeon: Kathryne Hitch, MD;  Location: [place];  Service: Orthopedics;  Laterality: Left;   THROMBECTOMY OF BYPASS GRAFT FEMORAL- POPLITEAL ARTERY Left 05/01/2020   Procedure: THROMBECTOMY OF LOWER EXTREMITY;  Surgeon: Waynetta Sandy, MD;  Location: [place];  Service: Vascular;  Laterality: Left;   TRANSFORAMINAL LUMBAR INTERBODY FUSION (TLIF) WITH PEDICLE SCREW FIXATION 1 LEVEL N/A 01/03/2018   Procedure: TRANSFORAMINAL LUMBAR INTERBODY FUSION (TLIF) LUMBAR FIVE-SACRAL ONE;  Surgeon: Melina Schools, MD;  Location: [place];  Service: Orthopedics;  Laterality: N/A;   ULTRASOUND GUIDANCE FOR VASCULAR ACCESS Right 01/22/2020   Procedure: ULTRASOUND GUIDANCE FOR VASCULAR ACCESS Right Common Femoral Artery.;  Surgeon: Marty Heck, MD;  Location: [place];  Service: Vascular;  Laterality: Right;   VIDEO ASSISTED THORACOSCOPY (VATS)/DECORTICATION Right 11/21/2011   drainage of empyema     Medications Prior to Admission: Prior to Admission medications   Medication Sig Start Date End Date Taking? Authorizing Provider  amLODipine (NORVASC) 10 MG tablet TAKE 1 TABLET BY MOUTH EVERY DAY 05/23/21   Jose Persia, MD  APIXABAN [facility] [place]) VTE STARTER PACK ('10MG'$  AND '5MG'$ ) Take as directed on package: start with  two-'5mg'$  tablets twice daily for 7 days. On day 8, switch to one-'5mg'$  tablet twice daily. 01/10/22   Tonye Pearson, PA-C  atorvastatin (LIPITOR) 80 MG tablet Take 1 tablet (80 mg total) by mouth daily at 6 PM. 11/06/19   Oda Kilts, MD  chlorthalidone (HYGROTON) 25 MG tablet TAKE 1 TABLET BY MOUTH EVERY DAY 09/27/21   Demaio, Alexa, MD  clopidogrel (PLAVIX) 75 MG tablet Take 1 tablet (75 mg total) by mouth daily. 07/18/21 07/18/22  Delene Ruffini, MD  diclofenac Sodium (VOLTAREN) 1 % GEL Apply 2-4 g topically in  the morning, at noon, in the evening, and at bedtime. 12/26/21   [provider]  docusate sodium (COLACE) 100 MG capsule Take 1 capsule (100 mg total) by mouth daily. 01/27/21   Madalyn Rob, MD  doxycycline (VIBRAMYCIN) 100 MG capsule Take 1 capsule (100 mg total) by mouth 2 (two) times daily. 01/27/21   Madalyn Rob, MD  DULoxetine (CYMBALTA) 30 MG capsule Take 30 mg by mouth daily. 12/26/21   [provider]  gabapentin (NEURONTIN) 100 MG capsule Take 100 mg by mouth at bedtime as needed (for pain).  12/31/19   [provider]  gabapentin (NEURONTIN) 300 MG capsule Take 300 mg by mouth 2 (two) times daily. 08/02/21   [provider]  Glycerin-Polysorbate 80 (REFRESH DRY EYE THERAPY OP) Place 1 drop into both eyes 3 (three) times daily as needed (for dryness or irritation).     [provider]  ibuprofen (ADVIL) 200 MG tablet Take 200-600 mg by mouth every 6 (six) hours as needed for headache or mild pain.    [provider]  naloxone [facility]) 2 MG/2ML injection Inject 2 mg into the muscle once as needed (opiod overdose (call 911, inject intramuscularly in shoulder or thigh. Repeat every 3 minutes)).     [provider]  predniSONE (DELTASONE) 10 MG tablet TAKE 1 TABLET BY MOUTH EVERY DAY 03/22/21   Christian, Rylee, MD  varenicline (CHANTIX PAK) 0.5 MG X 11 & 1 MG X 42 tablet TAKE ONE 0.5 MG TABLET BY MOUTH ONCE DAILY FOR 3 DAYS, THEN INCREASE TO ONE 0.5 MG TABLET TWICE DAILY FOR 4 DAYS, THEN INCREASE TO ONE 1 MG TABLET TWICE DAILY. 04/05/21   Virl Axe, MD  varenicline (CHANTIX) 1 MG tablet TAKE 1 TABLET BY MOUTH TWICE A DAY 04/05/21   Virl Axe, MD     Allergies:    Allergies  Allergen Reactions   Dilaudid [Hydromorphone Hcl] Rash   Ramipril Rash and Other (See Comments)    Per patient on R arm and leg only; no angioedema   Tramadol Itching and Rash    Social History:   Social History   Socioeconomic History   Marital  status: Single    Spouse name: Not on file   Number of children: 0   Years of education: Not on file   Highest education level: Not on file  Occupational History   Occupation: Disabled    Comment: 2/2 RA  Tobacco Use   Smoking status: Every Day    Packs/day: 0.25    Years: 20.00    Total pack years: 5.00    Types: Cigarettes    Last attempt to quit: 02/19/2019    Years since quitting: 2.9   Smokeless tobacco: Never   Tobacco comments:    Patient stated he smokes a couple day and is attempting to quit smoking  Vaping Use   Vaping Use: Never used  Substance and Sexual Activity   Alcohol use: No    Alcohol/week: 0.0 standard drinks of alcohol    Comment: sober 1998   Drug use: No   Sexual activity: Not on file  Other Topics Concern   Not on file  Social History Narrative   On disability since 1992, used to work with city of [place]. Quit drinking in 1990.      Current Social History 11/05/2019        Patient lives by himself most of the time. Sometimes girlfriend's son stays with him in a one level home. There are 3 steps with handrail up to the entrance the patient uses.       Patient's method of transportation is personal truck. This was recently vandalized and patient doesn't have funds to repair at this time.      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, BSN, RN-BC    Social Determinants of Health   Financial Resource Strain: Not on file  Food Insecurity: No Food Insecurity (01/27/2022)   Hunger Vital Sign    Worried About Running Out of Food in  the Last Year: Never true    [place] in the Last Year: Never true  Transportation Needs: No Transportation Needs (01/27/2022)   PRAPARE - Hydrologist (Medical): No    Lack of Transportation (Non-Medical): No  Physical Activity: Not on file  Stress: Not on file  Social Connections: Not on file  Intimate Partner Violence: Not on file    Family History:   The patient's family history includes Alcohol abuse in his father; Aneurysm in his mother; Heart attack (age of onset: 57) in his father. There is no history of Stroke or Cancer.    ROS:  Please see the history of present illness.  All other ROS reviewed and negative.     Physical Exam/Data:   Vitals:   01/31/22 1300 01/31/22 1315 01/31/22 1330 01/31/22 1345  BP: (!) 164/96 (!) 199/117 (!) 147/94 (!) 146/93  Pulse: 81 (!) 108 (!) 114 (!) 116  Resp: (!) 24 (!) 24 (!) 24 (!) 24  Temp:  (!) 92.9 F (33.8 C) (!) 96.8 F (36 C) (!) 97.5 F (36.4 C)  SpO2: 97% 99% 98% 99%  Height:       No intake or output data in the 24 hours ending 01/31/22 1357    07/18/2021    9:45 AM 01/27/2021   11:05 AM 06/26/2020    7:44 PM  Last 3 Weights  Weight (lbs) 178 lb 170 lb 178 lb  Weight (kg) 80.74 kg 77.111 kg 80.74 kg     Body mass index is 27.06 kg/m.  General:  sedated, intubated, thumper in place not on Neck: no JVD Cardiac:  regular rhythm, regular rate Lungs:  intubated Ext: left AKA, right LE edema  Skin: warm and dry  Neuro:  sedated  Psych:  sedated   EKG:  The ECG that was done  was personally reviewed and demonstrates SR with HR 55, inferior STE   Relevant CV Studies:  Echo 2020 1. The left ventricle has low normal systolic function, with an ejection  fraction of 50-55%. The cavity size was normal. Left ventricular diastolic  Doppler parameters are consistent with impaired relaxation.   2. The right ventricle has normal systolic function. The cavity was  normal. There is no increase in  right ventricular wall thickness. Right  ventricular systolic pressure is normal with an estimated pressure of 11.4  mmHg.   3. The aortic valve is tricuspid. Mild thickening of the aortic valve.  Mild calcification of the aortic valve. Aortic valve regurgitation is  trivial by color flow Doppler. No stenosis of the aortic valve.   4. The aorta is normal unless otherwise noted.   Left heart cath 03/15/17: Conclusions: Mild to moderate, non-obstructive CAD involving codominant LCx and RCA. Normal left ventricular contraction and filling pressure.   Recommendations: Medical therapy and risk factor modification to prevent progression of disease.  Laboratory Data:  High Sensitivity Troponin:  No results for input(s): "TROPONINIHS" in the last 720 hours.    Chemistry Recent Labs  Lab 01/31/22 1236 01/31/22 1354  NA 138  --   K 4.1  --   CREATININE  --  1.10    No results for input(s): "PROT", "ALBUMIN", "AST", "ALT", "ALKPHOS", "BILITOT" in the last 168 hours. Lipids No results for input(s): "CHOL", "TRIG", "HDL", "LABVLDL", "LDLCALC", "CHOLHDL" in the last 168 hours. Hematology Recent Labs  Lab 01/31/22 1236 01/31/22 1306  WBC  --  12.9*  RBC  --  5.08  HGB 14.3 13.2  HCT 42.0 46.3  MCV  --  91.1  MCH  --  26.0  MCHC  --  28.5*  RDW  --  17.0*  PLT  --  142*   Thyroid No results for input(s): "TSH", "FREET4" in the last 168 hours. BNPNo results for input(s): "BNP", "PROBNP" in the last 168 hours.  DDimer No results for input(s): "DDIMER" in the last 168 hours.   Radiology/Studies:  DG Chest Port 1 View  Result Date: 01/31/2022 CLINICAL DATA:  Post CPR EXAM: PORTABLE CHEST 1 VIEW COMPARISON:  Chest x-ray 06/26/2020 FINDINGS: Endotracheal tube in good position. NG tube in the stomach with the tip at the GE junction. Atherosclerotic calcification aortic arch. Right lung is clear. Mild left upper lobe and left lower lobe airspace disease. No left effusion. No acute skeletal  abnormality IMPRESSION: Endotracheal tube in good position. NG tip in this stomach with the side hole in the distal esophagus. Mild airspace disease left upper lobe and left lower lobe. Possible atelectasis or aspiration. Electronically Signed   By: Franchot Gallo M.D.   On: 01/31/2022 12:50     Assessment and Plan:   Cardiac arrest Inferior ST elevations - initial code STEMI called for inferior ST elevation- given prolonged CPR time, canceled code STEMI - continue heparin gtt - unclear pain just prior to arrest - heart cath in 2018 with mild nonobstructive disease - will defer initial management to PCCM, obtain echo - if he regains cognitive function, will likely obtain coronary angiography   Recent acute DVT Was started on eliquis starter pack in addition to plavix for PAD - holding eliquis - now on heparin gtt   PAD S/P left AKA Continue plavix when taking PO   Libman-Sacks endocarditis Per echo in 2013  Repeat echo pending   Risk Assessment/Risk Scores:   For questions or updates, please contact [place] Please consult [email or web address] for contact info under     Signed, Ledora Bottcher, PA  01/31/2022 1:57 PM   Agree with note by Sunday Spillers  We were asked to see this 61 year old male patient who looks older than stated age status postcardiac arrest currently intubated on IV pressors.  He has a long history of PAD with multiple interventions.  He had a left AKA.  He has had chronic catheterization performed in 2018 that showed nonobstructive CAD.  He had normal LV function.  He apparently was complaining of chest pain and slumped over in his wheelchair.  His initial rhythm was VF.  He was shocked 3 times.  He had ROSC briefly once but ultimately had ROSC after over 30 minutes of CPR.  Currently he is intubated on pressors with a blood pressure of 654 systolic in sinus rhythm.  He has a palpable right common femoral pulse.  His EKG does show inferior ST segment  elevation.  His initial troponin was greater than 2500 with a lactic acid level of greater than 9.  Given the duration of CPR without ROSC at this point I do not think it is appropriate to take him urgently to the Cath Lab.  We will get PCCM to admit and manage.  If he regains neurologic function we will address the possibility coronary angiography.  In the meantime, I would heparinize, get a 2D echocardiogram.  We will continue to follow with you.    Lorretta Harp, M.D., [place], [name], [place] [address] [place], [place]  [id number]  [phone] 01/31/2022 2:49 PM

## 2022-01-31 NOTE — Progress Notes (Signed)
[facility] Progress Note Patient Name: [name] [name] DOB: 1961/03/13 MRN: [id number]   Date of Service  01/31/2022  HPI/Events of Note  Post-cardiac arrest with hypotension and fever, also sub-optimal sedation. He is alert and semi-purposeful, but not clearly following commands.  eICU Interventions  Low dose Fentanyl gtt ordered with plan to wean off Propofol, bilateral soft wrist restraints to prevent self-extubation,  PRN Tylenol enterally ? Rectally for temperature > 37 degrees, serial neuro-checks.        Frederik Pear 01/31/2022, 8:43 PM

## 2022-01-31 NOTE — ED Provider Notes (Signed)
Procedure Name: Intubation Date/Time: 01/31/2022 12:45 PM  Performed by: Garald Balding, PA-CPre-anesthesia Checklist: Patient identified, Patient being monitored, Emergency Drugs available, Timeout performed and Suction available Oxygen Delivery Method: Non-rebreather mask Preoxygenation: Pre-oxygenation with 100% oxygen Induction Type: Rapid sequence Ventilation: Mask ventilation without difficulty Laryngoscope Size: Mac and 4 Grade View: Grade I Tube size: 8.0 mm Number of attempts: 1 Airway Equipment and Method: Video-laryngoscopy Placement Confirmation: ETT inserted through vocal cords under direct vision, CO2 detector and Breath sounds checked- equal and bilateral Secured at: 26 cm Tube secured with: ETT holder        Garald Balding, PA-C 01/31/22 1257    Sherwood Gambler, MD 01/31/22 1349

## 2022-01-31 NOTE — ED Notes (Signed)
Echo still at bedside.

## 2022-01-31 NOTE — ED Notes (Signed)
Implied consent for sedation/ intubation

## 2022-01-31 NOTE — H&P (Signed)
NAME:  [name] [name], MRN:  [id number], DOB:  [birth year], LOS: 0 ADMISSION DATE:  01/31/2022, CONSULTATION DATE:  8/8 REFERRING MD:  Verta Ellen, CHIEF COMPLAINT:  cardiac arrest    History of Present Illness:  This is a 61 year old male patient who still resides at home however does have home health care assistance with a private care provider.  Most recently seen in consultation in the emergency room for acute lower extremity DVT and started on DOAC.  Presents to the emergency room on 8/8 via EMS.  Had previously been complaining of lower extremity pain then suddenly became unresponsive and pulseless.  Apparently CPR was started by bystander, on EMS arrival the patient was pulseless, CPR was initiated, was found to be in ventricular tachycardia underwent a total of 3 defibrillations and also given 300 mg of amiodarone.  Subsequent ROSC achieved estimated at 35 minutes. Arrived in the emergency room with pulse on epinephrine infusion EKG showing ST elevation GCS 3 seen by cardiology felt not candidate for left heart cath given severity of critical illness.  Critical care asked to admit Pertinent  Medical History  Peripheral arterial disease, status post acute on chronic limb ischemia of the left lower extremity ultimately requiring left AKA in 2021, ongoing partially occlusive clot in the external iliac vein and femoral vein. Recent right popliteal and tibial DVT initiated on DOAC Tobacco abuse Hypercholesterolemia Hypertension Degenerative joint disease, GERD, heart murmur, anemia, lupus, rheumatoid arthritis Wheelchair dependent at baseline, has a caregiver who assists with care at home [facility] Events: Including procedures, antibiotic start and stop dates in addition to other pertinent events   8/8 admitted status post cardiac arrest.  Had been defibrillated x3.  Started on heparin drip, did receive an amiodarone in the field GCS 3 on arrival, normothermia protocol initiated. CT  angio acute segmental PE to LL lobes and RU lobe. No RV strain. Multiple rib fractures involving 2-7 bilaterally non-displaced mid sternal rx. Bilateral gg airspace disease w/ ddx being post resuscitation contusions vs aspiration> CT head late subacute to chronic appearing left inferior occipital lobe infarct no acute findings.   Interim History / Subjective:   Objective   Blood pressure (Abnormal) 146/93, pulse (Abnormal) 116, temperature (Abnormal) 97.5 F (36.4 C), resp. rate (Abnormal) 24, height '5\' 8"'$  (1.727 m), SpO2 99 %.    Vent Mode: PRVC FiO2 (%):  [100 %] 100 % Set Rate:  [24 bmp] 24 bmp Vt Set:  [540 mL] 540 mL PEEP:  [8 cmH20] 8 cmH20 Plateau Pressure:  [21 cmH20] 21 cmH20  No intake or output data in the 24 hours ending 01/31/22 1451 There were no vitals filed for this visit.  Examination: General: chronically ill appearing 61 year old male currently unresponsive HENT: Normocephalic atraumatic pupils pinpoint orally intubated Lungs: Clear bilateral chest rise portable chest x-ray personally reviewed ett good position L>R airspace disease  Cardiovascular: SR w/ elevated T waves  Abdomen: soft not tender  Extremities: left AKA, right LE mottled and cool  Neuro: GCS 3 no response to pain no gag or cough  GU: foley being placed.   Resolved Hospital Problem list     Assessment & Plan:  Principal Problem:   Cardiac arrest [facility]) Active Problems:   Coronary artery disease involving native coronary artery of native heart with unstable angina pectoris (HCC)   Cardiogenic shock (HCC)   DVT (deep venous thrombosis) (HCC)   STEMI (ST elevation myocardial infarction) ([place])   Pulmonary emboli (HCC)   Acute respiratory failure (  [place])   Multiple fractures of ribs, left side, initial encounter for closed fracture   Sternal fracture   Pulmonary contusion   Aspiration pneumonia (HCC)   Lactic acidosis   Anoxic brain injury ([place])   H/O: stroke   HLD (hyperlipidemia)    Rheumatoid arthritis (HCC)   Tobacco use disorder   PAD (peripheral artery disease) (HCC)   Hx of AKA (above knee amputation), left (HCC)   Thrombocytopenia ([place])   Hyperglycemia  Cardiac arrest in setting of STEMI w/ VF. Favor acute primary insult being acute pulmonary emboli exacerbating underlying CAD Plan Admit to ICU Tele ECHO [place] VT Plavix VT Statin to start 8/9 if LFTs OK  Acute PE and BLE DVT. No evidence of RH strain Plan ECHO  IV heparin   Cardiogenic shock s/p cardiac arrest. Rapidly coming off pressors Plan Cont tele  Wean pressors Hold all antihypertensives ECHO   Acute respiratory failure s/p cardiac arrest c/b aspiration PNA, multiple rib fractures, pulmonary contusion, and resp acidosis  Plan Full vent support  Pplat goal < 30 driving pressure goal < 15  Inc RR to 30  VAP bundle  PAD protocol RASS goal -1 IV unasyn  Am cxr  Am abg Pain control for rib fxs  Acute metabolic encephalopathy w/ concern for anoxic brain injury superimposed on prior CVA Plan Supportive care MRI brain at 48 hrs if no improvement Normothermia protocol   Metabolic acidosis w/ severe lactic acidosis s/p cardiac arrest -bicarb gtt started in ER Plan Cont IV bicarb for now, d/c once Ph > 7.25 VE adjusted to address respiratory component  Serial abg and chems  H/o PAD Plan IV heparin and plavix   Hyperglycemia Plan Ssi   Thrombocytopenia Plan Monitor CBC        Best Practice (right click and "Reselect all SmartList Selections" daily)   Diet/type: NPO DVT prophylaxis: systemic heparin GI prophylaxis: PPI Lines: N/A Foley:  Yes, and it is still needed Code Status:  full code Last date of multidisciplinary goals of care discussion [pending]  Labs   CBC: Recent Labs  Lab 01/31/22 1236 01/31/22 1306 01/31/22 1426  WBC  --  12.9*  --   NEUTROABS  --  4.5  --   HGB 14.3 13.2 13.3  HCT 42.0 46.3 39.0  MCV  --  91.1  --   PLT  --  142*  --      Basic Metabolic Panel: Recent Labs  Lab 01/31/22 1236 01/31/22 1306 01/31/22 1354 01/31/22 1426  NA 138 140  --  136  K 4.1 4.2  --  4.0  CL  --  107  --   --   CO2  --  10*  --   --   GLUCOSE  --  305*  --   --   BUN  --  17  --   --   CREATININE  --  1.36* 1.10  --   CALCIUM  --  8.9  --   --    GFR: CrCl cannot be calculated (Unknown ideal weight.). Recent Labs  Lab 01/31/22 1300 01/31/22 1306  WBC  --  12.9*  LATICACIDVEN >9.0* >9.0*    Liver Function Tests: Recent Labs  Lab 01/31/22 1306  AST 416*  ALT 406*  ALKPHOS 173*  BILITOT 0.3  PROT 5.8*  ALBUMIN 2.6*   No results for input(s): "LIPASE", "AMYLASE" in the last 168 hours. No results for input(s): "AMMONIA" in the last 168 hours.  ABG  Component Value Date/Time   PHART 7.092 (LL) 01/31/2022 1426   PCO2ART 58.0 (H) 01/31/2022 1426   PO2ART 279 (H) 01/31/2022 1426   HCO3 17.8 (L) 01/31/2022 1426   TCO2 20 (L) 01/31/2022 1426   ACIDBASEDEF 13.0 (H) 01/31/2022 1426   O2SAT 100 01/31/2022 1426     Coagulation Profile: No results for input(s): "INR", "PROTIME" in the last 168 hours.  Cardiac Enzymes: No results for input(s): "CKTOTAL", "CKMB", "CKMBINDEX", "TROPONINI" in the last 168 hours.  HbA1C: Hemoglobin A1C  Date/Time Value Ref Range Status  01/27/2021 12:26 PM 4.9 4.0 - 5.6 % Final   Hgb A1c MFr Bld  Date/Time Value Ref Range Status  01/31/2022 01:06 PM 5.0 4.8 - 5.6 % Final    Comment:    (NOTE) Pre diabetes:          5.7%-6.4%  Diabetes:              >6.4%  Glycemic control for   <7.0% adults with diabetes   12/08/2015 08:45 PM 5.3 4.8 - 5.6 % Final    Comment:    (NOTE)         Pre-diabetes: 5.7 - 6.4         Diabetes: >6.4         Glycemic control for adults with diabetes: <7.0     CBG: Recent Labs  Lab 01/31/22 1230  GLUCAP 329*    Review of Systems:   Not able   Past Medical History:  He,  has a past medical history of Anemia, Anemia (08/06/2012),  Aortic valve mass (12/30/2015), Community acquired pneumonia (11/07/2011), DDD (degenerative disc disease), lumbosacral, GERD (gastroesophageal reflux disease), Heart murmur, History of anemia, Hypertension, Hypokalemia (05/30/2018), Impingement syndrome of shoulder region (10/2014), Insomnia (06/01/2015), Ischemic ulcer of toe of left foot ([place]), Left shoulder pain (12/16/2015), Low back pain without sciatica (10/29/2009), Lupus ([place]), Peripheral vascular disease ([place]), Pneumonia, RA (rheumatoid arthritis) ([place]), and Rotator cuff tear (11/04/2014).   Surgical History:   Past Surgical History:  Procedure Laterality Date   ABDOMINAL AORTAGRAM  02/20/2019   ABDOMINAL AORTOGRAM W/LOWER EXTREMITY N/A 02/20/2019   Procedure: ABDOMINAL AORTOGRAM W/LOWER EXTREMITY;  Surgeon: Marty Heck, MD;  Location: [place] [facility];  Service: Cardiovascular;  Laterality: N/A;   ACHILLES TENDON SURGERY Bilateral    AMPUTATION Left 05/11/2020   Procedure: LEFT ABOVE KNEE AMPUTATION;  Surgeon: Angelia Mould, MD;  Location: [place];  Service: Vascular;  Laterality: Left;   ENDARTERECTOMY POPLITEAL Left 05/01/2020   Procedure: ENDARTERECTOMY POPLITEAL;  Surgeon: Waynetta Sandy, MD;  Location: [place];  Service: Vascular;  Laterality: Left;   FASCIOTOMY Left 05/01/2020   Procedure: FASCIOTOMY;  Surgeon: Waynetta Sandy, MD;  Location: [place];  Service: Vascular;  Laterality: Left;   FEMORAL-POPLITEAL BYPASS GRAFT Left 02/26/2019   Procedure: BYPASS GRAFT FEMORAL-POPLITEAL ARTERY LEFT LEG USING 42m PROPATEN GRAFT;  Surgeon: BSerafina Mitchell MD;  [place] [facility]  Service: Vascular;  Laterality: Left;   INTRAOPERATIVE ARTERIOGRAM Left 01/22/2020   Procedure: INTRA OPERATIVE ARTERIOGRAM with administration of thrombolyics in Left femoral to popliteal bypass.;  Surgeon: CMarty Heck MD;  [place]  Service: Vascular;  Laterality: Left;   LEFT HEART CATH AND CORONARY ANGIOGRAPHY N/A  03/15/2017   Procedure: LEFT HEART CATH AND CORONARY ANGIOGRAPHY;  Surgeon: ENelva Bush MD;  [place];  Service: Cardiovascular;  Laterality: N/A;   LOWER EXTREMITY INTERVENTION Left 04/29/2020   Procedure: LOWER EXTREMITY INTERVENTION-  LYSIS;  Surgeon: Marty Heck, MD;  Location: [place] [facility];  Service: Cardiovascular;  Laterality: Left;   OSTEOTOMY AND ULNAR SHORTENING Right 07/22/2002   PATCH ANGIOPLASTY Left 05/01/2020   Procedure: PATCH ANGIOPLASTY USING HEMASHIELD PLATINUM FINESSE PATCH;  Surgeon: Waynetta Sandy, MD;  Location: [place];  Service: Vascular;  Laterality: Left;   PERIPHERAL VASCULAR ATHERECTOMY  01/23/2020   Procedure: PERIPHERAL VASCULAR ATHERECTOMY;  Surgeon: Marty Heck, MD;  Location: [place] [facility];  Service: Cardiovascular;;   PERIPHERAL VASCULAR BALLOON ANGIOPLASTY  01/23/2020   Procedure: PERIPHERAL VASCULAR BALLOON ANGIOPLASTY;  Surgeon: Marty Heck, MD;  Location: [place] [facility];  Service: Cardiovascular;;   PERIPHERAL VASCULAR INTERVENTION Left 04/30/2020   Procedure: PERIPHERAL VASCULAR INTERVENTION;  Surgeon: Cherre Robins, MD;  Location: [place] [facility];  Service: Cardiovascular;  Laterality: Left;   PERIPHERAL VASCULAR THROMBECTOMY N/A 01/23/2020   Procedure: lysis recheck;  Surgeon: Marty Heck, MD;  Location: [place] [facility];  Service: Cardiovascular;  Laterality: N/A;  + [place]    PERIPHERAL VASCULAR THROMBECTOMY N/A 04/30/2020   Procedure: Lysis Recheck;  Surgeon: Cherre Robins, MD;  Location: [place] [facility];  Service: Cardiovascular;  Laterality: N/A;   SHOULDER ARTHROSCOPY WITH BICEPSTENOTOMY Right 03/11/2015   Procedure: SHOULDER ARTHROSCOPY WITH BICEPSTENOTOMY;  Surgeon: Ninetta Lights, MD;  Location: [place];  Service: Orthopedics;  Laterality: Right;   SHOULDER ARTHROSCOPY WITH DISTAL CLAVICLE RESECTION Left 11/19/2014   Procedure: SHOULDER  ARTHROSCOPY WITH DISTAL CLAVICLE RESECTION;  Surgeon: Kathryne Hitch, MD;  Location: [place];  Service: Orthopedics;  Laterality: Left;   SHOULDER ARTHROSCOPY WITH ROTATOR CUFF REPAIR AND SUBACROMIAL DECOMPRESSION Left 11/19/2014   Procedure: LEFT SHOULDER ARTHROSCOPY, DEBRIDEMENT DISTAL CLAVICLE EXCISION, ACROMIOPLASTY WITH ROTATOR CUFF REPAIR ;  Surgeon: Kathryne Hitch, MD;  Location: [place];  Service: Orthopedics;  Laterality: Left;   THROMBECTOMY OF BYPASS GRAFT FEMORAL- POPLITEAL ARTERY Left 05/01/2020   Procedure: THROMBECTOMY OF LOWER EXTREMITY;  Surgeon: Waynetta Sandy, MD;  Location: [place];  Service: Vascular;  Laterality: Left;   TRANSFORAMINAL LUMBAR INTERBODY FUSION (TLIF) WITH PEDICLE SCREW FIXATION 1 LEVEL N/A 01/03/2018   Procedure: TRANSFORAMINAL LUMBAR INTERBODY FUSION (TLIF) LUMBAR FIVE-SACRAL ONE;  Surgeon: Melina Schools, MD;  Location: [place];  Service: Orthopedics;  Laterality: N/A;   ULTRASOUND GUIDANCE FOR VASCULAR ACCESS Right 01/22/2020   Procedure: ULTRASOUND GUIDANCE FOR VASCULAR ACCESS Right Common Femoral Artery.;  Surgeon: Marty Heck, MD;  Location: [place];  Service: Vascular;  Laterality: Right;   VIDEO ASSISTED THORACOSCOPY (VATS)/DECORTICATION Right 11/21/2011   drainage of empyema     Social History:   reports that he has been smoking cigarettes. He has a 5.00 pack-year smoking history. He has never used smokeless tobacco. He reports that he does not drink alcohol and does not use drugs.   Family History:  His family history includes Alcohol abuse in his father; Aneurysm in his mother; Heart attack (age of onset: 27) in his father. There is no history of Stroke or Cancer.   Allergies Allergies  Allergen Reactions   Dilaudid [Hydromorphone Hcl] Rash   Ramipril Rash and Other (See Comments)    Per patient on R arm and leg only; no angioedema   Tramadol Itching and Rash     Home Medications  Prior to  Admission medications   Medication Sig Start Date End Date Taking? Authorizing Provider  amLODipine (NORVASC) 10 MG tablet TAKE 1  TABLET BY MOUTH EVERY DAY 05/23/21   Jose Persia, MD  APIXABAN [facility]) VTE STARTER PACK ('10MG'$  AND '5MG'$ ) Take as directed on package: start with two-'5mg'$  tablets twice daily for 7 days. On day 8, switch to one-'5mg'$  tablet twice daily. 01/10/22   Tonye Pearson, PA-C  atorvastatin (LIPITOR) 80 MG tablet Take 1 tablet (80 mg total) by mouth daily at 6 PM. 11/06/19   Oda Kilts, MD  chlorthalidone (HYGROTON) 25 MG tablet TAKE 1 TABLET BY MOUTH EVERY DAY 09/27/21   Demaio, Alexa, MD  clopidogrel (PLAVIX) 75 MG tablet Take 1 tablet (75 mg total) by mouth daily. 07/18/21 07/18/22  Delene Ruffini, MD  diclofenac Sodium (VOLTAREN) 1 % GEL Apply 2-4 g topically in the morning, at noon, in the evening, and at bedtime. 12/26/21   [provider]  docusate sodium (COLACE) 100 MG capsule Take 1 capsule (100 mg total) by mouth daily. 01/27/21   Madalyn Rob, MD  doxycycline (VIBRAMYCIN) 100 MG capsule Take 1 capsule (100 mg total) by mouth 2 (two) times daily. 01/27/21   Madalyn Rob, MD  DULoxetine (CYMBALTA) 30 MG capsule Take 30 mg by mouth daily. 12/26/21   [provider]  gabapentin (NEURONTIN) 100 MG capsule Take 100 mg by mouth at bedtime as needed (for pain).  12/31/19   [provider]  gabapentin (NEURONTIN) 300 MG capsule Take 300 mg by mouth 2 (two) times daily. 08/02/21   [provider]  Glycerin-Polysorbate 80 (REFRESH DRY EYE THERAPY OP) Place 1 drop into both eyes 3 (three) times daily as needed (for dryness or irritation).     [provider]  ibuprofen (ADVIL) 200 MG tablet Take 200-600 mg by mouth every 6 (six) hours as needed for headache or mild pain.    [provider]  naloxone [facility]) 2 MG/2ML injection Inject 2 mg into the muscle once as needed (opiod overdose (call 911, inject intramuscularly in shoulder or  thigh. Repeat every 3 minutes)).     [provider]  predniSONE (DELTASONE) 10 MG tablet TAKE 1 TABLET BY MOUTH EVERY DAY 03/22/21   Christian, Rylee, MD  varenicline (CHANTIX PAK) 0.5 MG X 11 & 1 MG X 42 tablet TAKE ONE 0.5 MG TABLET BY MOUTH ONCE DAILY FOR 3 DAYS, THEN INCREASE TO ONE 0.5 MG TABLET TWICE DAILY FOR 4 DAYS, THEN INCREASE TO ONE 1 MG TABLET TWICE DAILY. 04/05/21   Virl Axe, MD  varenicline ([place]) 1 MG tablet TAKE 1 TABLET BY MOUTH TWICE A DAY 04/05/21   Virl Axe, MD     Critical care time: 35 minutes    Erick Colace ACNP-BC [place] Pager # [phone] OR # [phone] if no answer

## 2022-01-31 NOTE — Progress Notes (Signed)
An USGPIV (ultrasound guided PIV) has been placed for short-term vasopressor infusion. A correctly placed ivWatch must be used when administering Vasopressors. Should this treatment be needed beyond 72 hours, central line access should be obtained.  It will be the responsibility of the bedside nurse to follow best practice to prevent extravasations. This IV was placed on posterior Lt arm # 20 due to pt having large bore IV's in both A/C .

## 2022-01-31 NOTE — Progress Notes (Signed)
EEG complete - results pending 

## 2022-02-01 ENCOUNTER — Inpatient Hospital Stay (HOSPITAL_COMMUNITY): Payer: Medicare Other

## 2022-02-01 DIAGNOSIS — I469 Cardiac arrest, cause unspecified: Secondary | ICD-10-CM | POA: Diagnosis not present

## 2022-02-01 DIAGNOSIS — I739 Peripheral vascular disease, unspecified: Secondary | ICD-10-CM

## 2022-02-01 DIAGNOSIS — R4182 Altered mental status, unspecified: Secondary | ICD-10-CM

## 2022-02-01 DIAGNOSIS — E872 Acidosis, unspecified: Secondary | ICD-10-CM | POA: Diagnosis not present

## 2022-02-01 DIAGNOSIS — I2111 ST elevation (STEMI) myocardial infarction involving right coronary artery: Secondary | ICD-10-CM

## 2022-02-01 DIAGNOSIS — R570 Cardiogenic shock: Secondary | ICD-10-CM

## 2022-02-01 DIAGNOSIS — I824Z9 Acute embolism and thrombosis of unspecified deep veins of unspecified distal lower extremity: Secondary | ICD-10-CM | POA: Diagnosis not present

## 2022-02-01 LAB — HEPARIN LEVEL (UNFRACTIONATED)
Heparin Unfractionated: 0.81 IU/mL — ABNORMAL HIGH (ref 0.30–0.70)
Heparin Unfractionated: 0.58 IU/mL (ref 0.30–0.70)

## 2022-02-01 LAB — BASIC METABOLIC PANEL
Anion gap: 12 (ref 5–15)
BUN: 32 mg/dL — ABNORMAL HIGH (ref 8–23)
CO2: 24 mmol/L (ref 22–32)
Calcium: 7.2 mg/dL — ABNORMAL LOW (ref 8.9–10.3)
Chloride: 101 mmol/L (ref 98–111)
Creatinine, Ser: 2.33 mg/dL — ABNORMAL HIGH (ref 0.61–1.24)
GFR, Estimated: 31 mL/min — ABNORMAL LOW (ref >60)
Glucose, Bld: 127 mg/dL — ABNORMAL HIGH (ref 70–99)
Potassium: 3.5 mmol/L (ref 3.5–5.1)
Sodium: 137 mmol/L (ref 135–145)

## 2022-02-01 LAB — POCT I-STAT 7, (LYTES, BLD GAS, ICA,H+H)
Acid-base deficit: 1 mmol/L (ref 0.0–2.0)
Bicarbonate: 24 mmol/L (ref 20.0–28.0)
Calcium, Ion: 1.04 mmol/L — ABNORMAL LOW (ref 1.15–1.40)
HCT: 39 % (ref 39.0–52.0)
Hemoglobin: 13.3 g/dL (ref 13.0–17.0)
O2 Saturation: 100 %
Patient temperature: 38.1
Potassium: 3.5 mmol/L (ref 3.5–5.1)
Sodium: 139 mmol/L (ref 135–145)
TCO2: 25 mmol/L (ref 22–32)
pCO2 arterial: 40.3 mmHg (ref 32–48)
pH, Arterial: 7.388 (ref 7.35–7.45)
pO2, Arterial: 375 mmHg — ABNORMAL HIGH (ref 83–108)

## 2022-02-01 LAB — COMPREHENSIVE METABOLIC PANEL
ALT: 406 U/L — ABNORMAL HIGH (ref 0–44)
AST: 416 U/L — ABNORMAL HIGH (ref 15–41)
Albumin: 2.6 g/dL — ABNORMAL LOW (ref 3.5–5.0)
Alkaline Phosphatase: 173 U/L — ABNORMAL HIGH (ref 38–126)
Anion gap: 23 — ABNORMAL HIGH (ref 5–15)
BUN: 17 mg/dL (ref 8–23)
CO2: 10 mmol/L — ABNORMAL LOW (ref 22–32)
Calcium: 8.9 mg/dL (ref 8.9–10.3)
Chloride: 107 mmol/L (ref 98–111)
Creatinine, Ser: 1.36 mg/dL — ABNORMAL HIGH (ref 0.61–1.24)
GFR, Estimated: 59 mL/min — ABNORMAL LOW (ref >60)
Glucose, Bld: 305 mg/dL — ABNORMAL HIGH (ref 70–99)
Potassium: 4.2 mmol/L (ref 3.5–5.1)
Sodium: 140 mmol/L (ref 135–145)
Total Bilirubin: 0.3 mg/dL (ref 0.3–1.2)
Total Protein: 5.8 g/dL — ABNORMAL LOW (ref 6.5–8.1)

## 2022-02-01 LAB — CBC
HCT: 36.9 % — ABNORMAL LOW (ref 39.0–52.0)
Hemoglobin: 12.2 g/dL — ABNORMAL LOW (ref 13.0–17.0)
MCH: 26.1 pg (ref 26.0–34.0)
MCHC: 33.1 g/dL (ref 30.0–36.0)
MCV: 78.8 fL — ABNORMAL LOW (ref 80.0–100.0)
Platelets: 195 10*3/uL (ref 150–400)
RBC: 4.68 MIL/uL (ref 4.22–5.81)
RDW: 16.6 % — ABNORMAL HIGH (ref 11.5–15.5)
WBC: 15.1 10*3/uL — ABNORMAL HIGH (ref 4.0–10.5)
nRBC: 0 % (ref 0.0–0.2)

## 2022-02-01 LAB — GLUCOSE, CAPILLARY
Glucose-Capillary: 120 mg/dL — ABNORMAL HIGH (ref 70–99)
Glucose-Capillary: 126 mg/dL — ABNORMAL HIGH (ref 70–99)
Glucose-Capillary: 132 mg/dL — ABNORMAL HIGH (ref 70–99)
Glucose-Capillary: 141 mg/dL — ABNORMAL HIGH (ref 70–99)
Glucose-Capillary: 149 mg/dL — ABNORMAL HIGH (ref 70–99)
Glucose-Capillary: 94 mg/dL (ref 70–99)

## 2022-02-01 LAB — COMPREHENSIVE METABOLIC PANEL WITH GFR
ALT: 297 U/L — ABNORMAL HIGH (ref 0–44)
AST: 319 U/L — ABNORMAL HIGH (ref 15–41)
Albumin: 2.8 g/dL — ABNORMAL LOW (ref 3.5–5.0)
Alkaline Phosphatase: 178 U/L — ABNORMAL HIGH (ref 38–126)
Anion gap: 16 — ABNORMAL HIGH (ref 5–15)
BUN: 29 mg/dL — ABNORMAL HIGH (ref 8–23)
CO2: 22 mmol/L (ref 22–32)
Calcium: 7.7 mg/dL — ABNORMAL LOW (ref 8.9–10.3)
Chloride: 101 mmol/L (ref 98–111)
Creatinine, Ser: 2.11 mg/dL — ABNORMAL HIGH (ref 0.61–1.24)
GFR, Estimated: 35 mL/min — ABNORMAL LOW
Glucose, Bld: 156 mg/dL — ABNORMAL HIGH (ref 70–99)
Potassium: 3.5 mmol/L (ref 3.5–5.1)
Sodium: 139 mmol/L (ref 135–145)
Total Bilirubin: 0.5 mg/dL (ref 0.3–1.2)
Total Protein: 6.1 g/dL — ABNORMAL LOW (ref 6.5–8.1)

## 2022-02-01 LAB — ECHOCARDIOGRAM LIMITED
Area-P 1/2: 3.6 cm2
Height: 68 in
S' Lateral: 3 cm
Weight: 2603.19 oz

## 2022-02-01 LAB — MAGNESIUM
Magnesium: 1.4 mg/dL — ABNORMAL LOW (ref 1.7–2.4)
Magnesium: 2 mg/dL (ref 1.7–2.4)

## 2022-02-01 LAB — APTT
aPTT: 33 seconds (ref 24–36)
aPTT: 60 seconds — ABNORMAL HIGH (ref 24–36)

## 2022-02-01 LAB — TRIGLYCERIDES: Triglycerides: 226 mg/dL — ABNORMAL HIGH

## 2022-02-01 MED ORDER — LIDOCAINE HCL (PF) 1 % IJ SOLN
INTRAMUSCULAR | Status: AC
  Administered 2022-02-01: 2.5 mL
  Filled 2022-02-01: qty 5

## 2022-02-01 MED ORDER — PERFLUTREN LIPID MICROSPHERE
1.0000 mL | INTRAVENOUS | Status: AC | PRN
  Administered 2022-02-01: 3 mL via INTRAVENOUS

## 2022-02-01 MED ORDER — MIDAZOLAM-SODIUM CHLORIDE 100-0.9 MG/100ML-% IV SOLN
0.5000 mg/h | INTRAVENOUS | Status: DC
  Administered 2022-02-01: 0.5 mg/h via INTRAVENOUS
  Filled 2022-02-01: qty 100

## 2022-02-01 MED ORDER — AMIODARONE HCL IN DEXTROSE 360-4.14 MG/200ML-% IV SOLN: 30.0000 mg/h | INTRAVENOUS | Status: DC

## 2022-02-01 MED ORDER — PROPOFOL 1000 MG/100ML IV EMUL
5.0000 ug/kg/min | INTRAVENOUS | Status: AC
  Administered 2022-02-01: 5 ug/kg/min via INTRAVENOUS

## 2022-02-01 MED ORDER — AMIODARONE HCL IN DEXTROSE 360-4.14 MG/200ML-% IV SOLN
30.0000 mg/h | INTRAVENOUS | Status: DC
  Administered 2022-02-02 – 2022-02-03 (×3): 30 mg/h via INTRAVENOUS
  Filled 2022-02-01 (×4): qty 200

## 2022-02-01 MED ORDER — AMIODARONE LOAD VIA INFUSION
150.0000 mg | Freq: Once | INTRAVENOUS | Status: DC
  Filled 2022-02-01: qty 83.34

## 2022-02-01 MED ORDER — OXYCODONE HCL 5 MG PO TABS
5.0000 mg | ORAL_TABLET | ORAL | Status: DC | PRN
  Administered 2022-02-01 – 2022-02-02 (×5): 5 mg via ORAL
  Filled 2022-02-01 (×5): qty 1

## 2022-02-01 MED ORDER — AMIODARONE HCL IN DEXTROSE 360-4.14 MG/200ML-% IV SOLN
60.0000 mg/h | INTRAVENOUS | Status: AC
  Administered 2022-02-01 (×2): 60 mg/h via INTRAVENOUS
  Filled 2022-02-01: qty 200

## 2022-02-01 MED ORDER — POTASSIUM CHLORIDE 20 MEQ PO PACK
40.0000 meq | PACK | Freq: Once | ORAL | Status: AC
  Administered 2022-02-01: 40 meq
  Filled 2022-02-01: qty 2

## 2022-02-01 MED ORDER — ASPIRIN 81 MG PO CHEW
81.0000 mg | CHEWABLE_TABLET | Freq: Every day | ORAL | Status: DC
  Administered 2022-02-01 – 2022-02-05 (×4): 81 mg via ORAL
  Filled 2022-02-01 (×5): qty 1

## 2022-02-01 MED ORDER — POTASSIUM CHLORIDE 20 MEQ PO PACK
40.0000 meq | PACK | Freq: Once | ORAL | Status: AC
Start: 2022-02-01 — End: 2022-02-01
  Administered 2022-02-01: 40 meq
  Filled 2022-02-01: qty 2

## 2022-02-01 MED ORDER — ATORVASTATIN CALCIUM 80 MG PO TABS
80.0000 mg | ORAL_TABLET | Freq: Every day | ORAL | Status: DC
  Administered 2022-02-01 – 2022-02-02 (×2): 80 mg via ORAL
  Filled 2022-02-01 (×2): qty 1

## 2022-02-01 MED ORDER — AMIODARONE HCL IN DEXTROSE 360-4.14 MG/200ML-% IV SOLN: 60.0000 mg/h | INTRAVENOUS | Status: DC

## 2022-02-01 MED ORDER — HEPARIN BOLUS VIA INFUSION
1500.0000 [IU] | Freq: Once | INTRAVENOUS | Status: AC
  Administered 2022-02-01: 1500 [IU] via INTRAVENOUS
  Filled 2022-02-01: qty 1500

## 2022-02-01 MED ORDER — HEPARIN SODIUM (PORCINE) 5000 UNIT/ML IJ SOLN
2000.0000 [IU] | Freq: Once | INTRAMUSCULAR | Status: AC
Start: 2022-02-01 — End: 2022-02-01
  Administered 2022-02-01: 2000 [IU] via INTRAVENOUS

## 2022-02-01 MED ORDER — LIDOCAINE HCL 1 % IJ SOLN
5.0000 mL | Freq: Once | INTRAMUSCULAR | Status: AC
  Filled 2022-02-01: qty 5

## 2022-02-01 MED ORDER — MAGNESIUM SULFATE 4 GM/100ML IV SOLN
4.0000 g | Freq: Once | INTRAVENOUS | Status: AC
Start: 2022-02-01 — End: 2022-02-01
  Administered 2022-02-01: 4 g via INTRAVENOUS
  Filled 2022-02-01: qty 100

## 2022-02-01 MED ORDER — SODIUM CHLORIDE 0.9 % IV SOLN
3.0000 g | Freq: Four times a day (QID) | INTRAVENOUS | Status: AC
  Administered 2022-02-01 – 2022-02-06 (×20): 3 g via INTRAVENOUS
  Filled 2022-02-01 (×21): qty 8

## 2022-02-01 MED ORDER — MIDAZOLAM HCL 2 MG/2ML IJ SOLN
INTRAMUSCULAR | Status: AC
  Administered 2022-02-01: 1 mg
  Filled 2022-02-01: qty 2

## 2022-02-01 NOTE — Procedures (Addendum)
Arterial Catheter Insertion Procedure Note  [name] [name]  [id number]  Oct 22, 1960  Date:02/01/22  Time:1:49 AM    Provider Performing: Collier Bullock    Procedure: Insertion of Arterial Line [phone]) without US guidance  Indication(s) Blood pressure monitoring and/or need for frequent ABGs  Consent Risks of the procedure as well as the alternatives and risks of each were explained to the patient and/or caregiver.  Consent for the procedure was obtained and is signed in the bedside chart  Anesthesia [place] 1%   Time Out Verified patient identification, verified procedure, site/side was marked, verified correct patient position, special equipment/implants available, medications/allergies/relevant history reviewed, required imaging and test results available.   Sterile Technique Maximal sterile technique including full sterile barrier drape, hand hygiene, sterile gown, sterile gloves, mask, hair covering, sterile ultrasound probe cover (if used).   Procedure Description Area of catheter insertion was cleaned with chlorhexidine and draped in sterile fashion. Without real-time ultrasound guidance an arterial catheter was placed into the right radial artery.  Appropriate arterial tracings confirmed on monitor.     Complications/Tolerance None; patient tolerated the procedure well.   EBL Minimal   Specimen(s) None

## 2022-02-01 NOTE — Procedures (Signed)
Patient Name: DESTEN MANOR  MRN: [id number]  Epilepsy Attending: Lora Havens  Referring Physician/Provider: Erick Colace, NP  Date: 01/31/2022 Duration: 30.34 mins  Patient history: 61 year old male status postcardiac arrest.  EEG to evaluate for seizure.  Level of alertness: comatose  AEDs during EEG study: Propofol  Technical aspects: This EEG study was done with scalp electrodes positioned according to the 10-20 International system of electrode placement. Electrical activity was reviewed with band pass filter of 1-'70Hz'$ , sensitivity of 7 uV/mm, display speed of 42m/sec with a '60Hz'$  notched filter applied as appropriate. EEG data were recorded continuously and digitally stored.  Video monitoring was available and reviewed as appropriate.  Description: EEG showed continuous generalized 2 to 3 Hz With overriding drop to 40 Hz beta activity.  Intermittent episodes of subtle head jerks were noted without concomitant EEG change.  Hyperventilation and photic stimulation were not performed.     ABNORMALITY - Continuous slow, generalized  IMPRESSION: This study is suggestive of severe diffuse encephalopathy, nonspecific etiology.  Intermittent episodes of head jerk were noted without concomitant EEG change or most likely not epileptic.  No seizures or epileptiform discharges were seen throughout the recording.  Ursala Cressy OBarbra Sarks

## 2022-02-01 NOTE — Progress Notes (Signed)
ANTICOAGULATION CONSULT NOTE   Pharmacy Consult for Heparin infusion Indication:  STEMI , DVT, b/l PE  Allergies  Allergen Reactions   Dilaudid [Hydromorphone Hcl] Rash   Ramipril Rash and Other (See Comments)    Per patient on R arm and leg only; no angioedema   Tramadol Itching and Rash    Patient Measurements: Height: '5\' 8"'$  (172.7 cm) Weight: 73.8 kg (162 lb 11.2 oz) IBW/kg (Calculated) : 68.4 Heparin Dosing Weight: 80 kg  Vital Signs: Temp: 98.9 F (37.2 C) (08/09 1100) Temp Source: Oral (08/09 1100) BP: 99/77 (08/09 1100) Pulse Rate: 108 (08/09 1100)  Labs: Recent Labs    01/31/22 1306 01/31/22 1340 01/31/22 1354 01/31/22 1426 01/31/22 1843 01/31/22 2041 02/01/22 0134 02/01/22 0414  HGB 13.2  --   --  13.3  --   --  13.3 12.2*  HCT 46.3  --   --  39.0  --   --  39.0 36.9*  PLT 142*  --   --   --   --   --   --  195  APTT  --  147*  --   --   --  42*  --  33  LABPROT  --  17.6*  --   --   --   --   --   --   INR  --  1.5*  --   --   --   --   --   --   HEPARINUNFRC  --   --   --   --  >1.10*  --   --  0.81*  CREATININE 1.36*  --  1.10  --  1.59*  --   --  2.11*  TROPONINIHS 2,599*  --   --   --  7,479*  --   --   --      Estimated Creatinine Clearance: 35.6 mL/min (A) (by C-G formula based on SCr of 2.11 mg/dL (H)).   Assessment: 61 yo M BIBEMS s/p CPR with ROSC after 30 minutes. ECG on presentation shows STEMI. Per dispense history and chart review, pt was initiated on apixaban for treatment of VTE on 01/23/22. Unsure last dose. Apixaban affecting heparin levels (>1.1) - utillizing aPTT for monitoring until levels correlate.  Pt on heparin gtt for STEMI, preexisting DVT, and also with b/l PE on CTA. Heparin rate was increased this morning, however access was lost at 1000 this AM. Heparin was restarted ~1100. Will bolus and retime labs.  Goal of Therapy:  Heparin level 0.3-0.7 units/ml aPTT 66-102 seconds Monitor platelets by anticoagulation protocol:  Yes   Plan:  Rebolus heparin 2000 units Restart heparin infusion to 1300 units/hr F/u aPTT and heparin level at 1800  Thank you for allowing pharmacy to participate in this patient's care.  Reatha Harps, PharmD PGY2 Pharmacy Resident 02/01/2022 11:25 AM Check [email or web address] for unit specific pharmacy number

## 2022-02-01 NOTE — Progress Notes (Signed)
Progress Note  Patient Name: [name] [name] Date of Encounter: 02/01/2022  [facility] Cardiologist: Candee Furbish, MD   Subjective   Day 1 post witnessed cardiac arrest with CPR.  Patient extubated this morning.  Per currently on nasal cannula with good sats.  He is alert and oriented.  He complains of chest pain most likely from CPR.  Inpatient Medications    Scheduled Meds:  acetaminophen  650 mg Oral Q4H   Or   acetaminophen (TYLENOL) oral liquid 160 mg/5 mL  650 mg Per Tube Q4H   Or   acetaminophen  650 mg Rectal Q4H   chlorhexidine  15 mL Mouth/Throat Once   Chlorhexidine Gluconate Cloth  6 each Topical Daily   clopidogrel  75 mg Per Tube Daily   docusate  100 mg Per Tube BID   insulin aspart  0-15 Units Subcutaneous Q4H   mouth rinse  15 mL Mouth Rinse Q2H   pantoprazole sodium  40 mg Per Tube Daily   polyethylene glycol  17 g Per Tube Daily   Continuous Infusions:  sodium chloride 10 mL/hr at 02/01/22 0700   sodium chloride 250 mL (01/31/22 1558)   sodium chloride     ampicillin-sulbactam (UNASYN) IV Stopped (01/31/22 2216)   heparin 1,300 Units/hr (02/01/22 0700)   magnesium sulfate bolus IVPB 4 g (02/01/22 0741)   norepinephrine (LEVOPHED) Adult infusion 20 mcg/min (02/01/22 0700)   propofol (DIPRIVAN) infusion 5 mcg/kg/min (02/01/22 0700)   PRN Meds: ondansetron (ZOFRAN) IV, mouth rinse   Vital Signs    Vitals:   02/01/22 0715 02/01/22 0730 02/01/22 0800 02/01/22 0815  BP: 112/84 110/87    Pulse: 98 (!) 102  99  Resp: (!) 27 18  (!) 24  Temp: 100.2 F (37.9 C) (!) 100.8 F (38.2 C) 98.9 F (37.2 C)   TempSrc:      SpO2: 98% 99%  92%  Weight:      Height:        Intake/Output Summary (Last 24 hours) at 02/01/2022 0847 Last data filed at 02/01/2022 0700 Gross per 24 hour  Intake 4279.77 ml  Output 1100 ml  Net 3179.77 ml      02/01/2022    5:00 AM 01/31/2022    2:00 PM 07/18/2021    9:45 AM  Last 3 Weights  Weight (lbs) 162 lb 11.2 oz 178 lb  178 lb  Weight (kg) 73.8 kg 80.74 kg 80.74 kg      Telemetry    Sinus rhythm PVCs- Personally Reviewed  ECG    Not performed today- Personally Reviewed  Physical Exam   GEN: No acute distress.   Neck: No JVD Cardiac: RRR, no murmurs, rubs, or gallops.  Respiratory: Clear to auscultation bilaterally. GI: Soft, nontender, non-distended  MS: No edema; No deformity. Neuro:  Nonfocal  Psych: Normal affect, alert, awake and oriented  Labs    High Sensitivity Troponin:   Recent Labs  Lab 01/31/22 1306 01/31/22 1843  TROPONINIHS 2,599* 7,479*     Chemistry Recent Labs  Lab 01/31/22 1306 01/31/22 1354 01/31/22 1426 01/31/22 1843 02/01/22 0134 02/01/22 0414  NA 140  --    < > 140 139 139  K 4.2  --    < > 3.9 3.5 3.5  CL 107  --   --  107  --  101  CO2 10*  --   --  19*  --  22  GLUCOSE 305*  --   --  236*  --  156*  BUN 17  --   --  23  --  29*  CREATININE 1.36* 1.10  --  1.59*  --  2.11*  CALCIUM 8.9  --   --  8.5*  --  7.7*  MG  --   --   --   --   --  1.4*  PROT 5.8*  --   --  6.9  --  6.1*  ALBUMIN 2.6*  --   --  3.0*  --  2.8*  AST 416*  --   --  598*  --  319*  ALT 406*  --   --  462*  --  297*  ALKPHOS 173*  --   --  228*  --  178*  BILITOT 0.3  --   --  0.5  --  0.5  GFRNONAA 59*  --   --  49*  --  35*  ANIONGAP 23*  --   --  14  --  16*   < > = values in this interval not displayed.    Lipids  Recent Labs  Lab 01/31/22 1306 02/01/22 0414  CHOL 165  --   TRIG 279* 226*  HDL 19*  --   LDLCALC 90  --   CHOLHDL 8.7  --     Hematology Recent Labs  Lab 01/31/22 1306 01/31/22 1426 02/01/22 0134 02/01/22 0414  WBC 12.9*  --   --  15.1*  RBC 5.08  --   --  4.68  HGB 13.2 13.3 13.3 12.2*  HCT 46.3 39.0 39.0 36.9*  MCV 91.1  --   --  78.8*  MCH 26.0  --   --  26.1  MCHC 28.5*  --   --  33.1  RDW 17.0*  --   --  16.6*  PLT 142*  --   --  195   Thyroid No results for input(s): "TSH", "FREET4" in the last 168 hours.  BNPNo results for  input(s): "BNP", "PROBNP" in the last 168 hours.  DDimer No results for input(s): "DDIMER" in the last 168 hours.   Radiology    EEG adult  Result Date: 02/01/2022 Lora Havens, MD     02/01/2022  8:34 AM Patient Name: SANDY BLOUCH MRN: [id number] Epilepsy Attending: Lora Havens Referring Physician/Provider: Erick Colace, NP Date: 01/31/2022 Duration: 30.34 mins Patient history: 61 year old male status postcardiac arrest.  EEG to evaluate for seizure. Level of alertness: comatose AEDs during EEG study: Propofol Technical aspects: This EEG study was done with scalp electrodes positioned according to the 10-20 International system of electrode placement. Electrical activity was reviewed with band pass filter of 1-'70Hz'$ , sensitivity of 7 uV/mm, display speed of 38m/sec with a '60Hz'$  notched filter applied as appropriate. EEG data were recorded continuously and digitally stored.  Video monitoring was available and reviewed as appropriate. Description: EEG showed continuous generalized 2 to 3 Hz With overriding drop to 40 Hz beta activity.  Intermittent episodes of subtle head jerks were noted without concomitant EEG change.  Hyperventilation and photic stimulation were not performed.   ABNORMALITY - Continuous slow, generalized IMPRESSION: This study is suggestive of severe diffuse encephalopathy, nonspecific etiology.  Intermittent episodes of head jerk were noted without concomitant EEG change or most likely not epileptic.  No seizures or epileptiform discharges were seen throughout the recording. PLora Havens  DG CHEST PORT 1 VIEW  Result Date: 02/01/2022 CLINICAL DATA:  61year old male central line placement. Pulmonary emboli.  EXAM: PORTABLE CHEST 1 VIEW COMPARISON:  Portable chest 01/31/2022 and earlier. FINDINGS: Portable AP semi upright view at 0622 hours. Endotracheal tube and visible enteric tube appears stable. New left IJ approach central line in place. Catheter tip at or just below the  carina (SVC level. No pneumothorax. Mediastinal contours are stable and within normal limits. Allowing for portable technique the lungs are now clear, and bilateral ventilation appears substantially improved from the chest CTA yesterday. IMPRESSION: 1. New left IJ approach central line with tip at the SVC level, no adverse features. 2. Otherwise stable lines and tubes. 3. Improved bilateral ventilation since the CTA yesterday. No new pulmonary abnormality. Electronically Signed   By: Genevie Ann M.D.   On: 02/01/2022 06:33   [place] EKG SITE RITE  Result Date: 01/31/2022 If Site Rite image not attached, placement could not be confirmed due to current cardiac rhythm.  DG CHEST PORT 1 VIEW  Result Date: 01/31/2022 CLINICAL DATA:  Pulmonary embolus, intubated EXAM: PORTABLE CHEST 1 VIEW COMPARISON:  823 FINDINGS: Single frontal view of the chest demonstrates endotracheal tube overlying tracheal air column tip well above carina. Enteric catheter passes below diaphragm tip excluded by collimation. The cardiac silhouette is stable. Improved aeration of the left lung since prior study. No focal consolidation, effusion, or pneumothorax. No acute bony abnormality. IMPRESSION: 1. Support devices as above. 2. Improved aeration of the lungs, with resolution of the left-sided airspace disease seen previously. Electronically Signed   By: Randa Ngo M.D.   On: 01/31/2022 22:13   ECHOCARDIOGRAM COMPLETE  Result Date: 01/31/2022    ECHOCARDIOGRAM REPORT   Patient Name:   SHERYL TOWELL Date of Exam: 01/31/2022 Medical Rec #:  [id number]     Height:       68.0 in Accession #:    [id number]    Weight:       178.0 lb Date of Birth:  05-08-1961    BSA:          1.945 m Patient Age:    67 years      BP:           130/94 mmHg Patient Gender: M             HR:           100 bpm. Exam Location:  Inpatient Procedure: 2D Echo, Cardiac Doppler and Color Doppler Indications:    Abnormal ECG R94.31  History:        Patient has prior history of  Echocardiogram examinations, most                 recent 02/22/2019. PVD, AV Mass, Signs/Symptoms:Murmur; Risk                 Factors:Hypertension. Lupus, Anemia.  Sonographer:    Eartha Inch Referring Phys: [place] Comments: Echo performed with patient supine and on artificial respirator. Image acquisition challenging due to patient body habitus and Image acquisition challenging due to respiratory motion. IMPRESSIONS  1. Left ventricular ejection fraction, by estimation, is 60 to 65%. The left ventricle has normal function. The left ventricle has no regional wall motion abnormalities. Indeterminate diastolic filling due to E-A fusion. There is the interventricular septum is flattened in systole, consistent with right ventricular pressure overload.  2. Right ventricular systolic function is severely reduced. The right ventricular size is mildly enlarged. Tricuspid regurgitation signal is inadequate for assessing PA pressure.  3. The mitral valve is normal  in structure. No evidence of mitral valve regurgitation.  4. The aortic valve was not well visualized. Aortic valve regurgitation is not visualized. Aortic valve sclerosis is present, with no evidence of aortic valve stenosis. Comparison(s): Prior images reviewed side by side. The right ventricular hypertrophy is significantly worse. Conclusion(s)/Recommendation(s): Discussed findings with primary team. FINDINGS  Left Ventricle: Left ventricular ejection fraction, by estimation, is 60 to 65%. The left ventricle has normal function. The left ventricle has no regional wall motion abnormalities. The left ventricular internal cavity size was normal in size. There is  no left ventricular hypertrophy. The interventricular septum is flattened in systole, consistent with right ventricular pressure overload. Indeterminate diastolic filling due to E-A fusion. Right Ventricle: McConnell's sign is present, strongly correlated with acute cor  pulmonale, most likely large pulmonary embolism. The right ventricular size is mildly enlarged. No increase in right ventricular wall thickness. Right ventricular systolic function is severely reduced. Tricuspid regurgitation signal is inadequate for assessing PA pressure. Left Atrium: Left atrial size was normal in size. Right Atrium: Right atrial size was normal in size. Pericardium: There is no evidence of pericardial effusion. Mitral Valve: The mitral valve is normal in structure. No evidence of mitral valve regurgitation. Tricuspid Valve: The tricuspid valve is normal in structure. Tricuspid valve regurgitation is not demonstrated. Aortic Valve: The aortic valve was not well visualized. Aortic valve regurgitation is not visualized. Aortic valve sclerosis is present, with no evidence of aortic valve stenosis. Pulmonic Valve: The pulmonic valve was not well visualized. Aorta: The aortic root is normal in size and structure. Venous: IVC assessment for right atrial pressure unable to be performed due to mechanical ventilation. IAS/Shunts: No atrial level shunt detected by color flow Doppler. Sanda Klein MD Electronically signed by Sanda Klein MD Signature Date/Time: 01/31/2022/4:17:16 PM    Final    CT Angio Chest PE W and/or Wo Contrast  Result Date: 01/31/2022 CLINICAL DATA:  Pulmonary embolism (PE) suspected, high prob EXAM: CT ANGIOGRAPHY CHEST WITH CONTRAST TECHNIQUE: Multidetector CT imaging of the chest was performed using the standard protocol during bolus administration of intravenous contrast. Multiplanar CT image reconstructions and MIPs were obtained to evaluate the vascular anatomy. RADIATION DOSE REDUCTION: This exam was performed according to the departmental dose-optimization program which includes automated exposure control, adjustment of the mA and/or kV according to patient size and/or use of iterative reconstruction technique. CONTRAST:  133m OMNIPAQUE IOHEXOL 350 MG/ML SOLN COMPARISON:   CTA 06/26/2020. FINDINGS: Cardiovascular: Satisfactory opacification of the pulmonary arteries to the segmental level. There is no acute pulmonary embolism at the branching of the left main pulmonary artery into the segmental lower lobe arteries (series 5, images 65-67, series 8, image [id number]). Additional small filling defect and a right upper lobe segmental artery (series 5, image 57). No central/saddle PE.Cardiomegaly. RV: LV ratio is 0.9. Trace pericardial effusion. Atherosclerosis of the thoracic aorta. Mediastinum/Nodes: No lymphadenopathy. The thyroid is unremarkable. Esophagus is unremarkable. Lungs/Pleura: There are confluent ground-glass opacities in the upper lungs bilaterally with areas of nodular consolidation and interlobular septal thickening. Bibasilar atelectasis. No pleural effusion. No pneumothorax. Mid to Upper Abdomen: No acute abnormality. Musculoskeletal: There are multiple acute anterior rib fractures, involving ribs 2 through 7 bilaterally. Most of these are angulated and nondisplaced. Fractures involving ribs 3 through 5 on the right are mildly displaced. Nondisplaced mid sternal fracture. Review of the MIP images confirms the above findings. IMPRESSION: Acute segmental pulmonary emboli in the left lower and right upper lobes. No definite CT  evidence of right heart strain. Complex ground-glass opacities in the mid to upper lungs bilaterally with areas of nodular consolidation and interlobular septal thickening. Findings are most consistent with contusions related to recent cardiopulmonary resuscitation, versus a multifocal infectious/inflammatory process. Bibasilar atelectasis. Multiple anterior rib fractures involving ribs 2 through 7 bilaterally. Nondisplaced mid sternal fracture. Critical Value/emergent results were called by telephone at the time of interpretation on 01/31/2022 at 2:29 pm to provider Sherwood Gambler , who verbally acknowledged these results. Electronically Signed   By:  Maurine Simmering M.D.   On: 01/31/2022 14:36   CT Head Wo Contrast  Result Date: 01/31/2022 CLINICAL DATA:  Neuro deficit, acute, stroke suspected EXAM: CT HEAD WITHOUT CONTRAST TECHNIQUE: Contiguous axial images were obtained from the base of the skull through the vertex without intravenous contrast. RADIATION DOSE REDUCTION: This exam was performed according to the departmental dose-optimization program which includes automated exposure control, adjustment of the mA and/or kV according to patient size and/or use of iterative reconstruction technique. COMPARISON:  12/08/2015 FINDINGS: Brain: Late subacute to chronic appearing infarct within the inferior left occipital lobe. Otherwise, no evidence of acute infarction. No hemorrhage, hydrocephalus, extra-axial collection, or mass lesion. Scattered low-density changes within the periventricular and subcortical white matter compatible with chronic microvascular ischemic change. Vascular: Atherosclerotic calcifications involving the large vessels of the skull base. No unexpected hyperdense vessel. Skull: Normal. Negative for fracture or focal lesion. Sinuses/Orbits: Small retention cyst or polyp in the right maxillary sinus. Other: None. IMPRESSION: 1. Late subacute to chronic appearing infarct within the inferior left occipital lobe. Follow-up MRI can be performed to more accurately characterize. 2. Otherwise, no acute intracranial abnormality. Electronically Signed   By: Davina Poke D.O.   On: 01/31/2022 14:25   DG Chest Port 1 View  Result Date: 01/31/2022 CLINICAL DATA:  Post CPR EXAM: PORTABLE CHEST 1 VIEW COMPARISON:  Chest x-ray 06/26/2020 FINDINGS: Endotracheal tube in good position. NG tube in the stomach with the tip at the GE junction. Atherosclerotic calcification aortic arch. Right lung is clear. Mild left upper lobe and left lower lobe airspace disease. No left effusion. No acute skeletal abnormality IMPRESSION: Endotracheal tube in good position. NG  tip in this stomach with the side hole in the distal esophagus. Mild airspace disease left upper lobe and left lower lobe. Possible atelectasis or aspiration. Electronically Signed   By: Franchot Gallo M.D.   On: 01/31/2022 12:50    Cardiac Studies   2D echocardiogram (01/31/2022)  IMPRESSIONS     1. Left ventricular ejection fraction, by estimation, is 60 to 65%. The  left ventricle has normal function. The left ventricle has no regional  wall motion abnormalities. Indeterminate diastolic filling due to E-A  fusion. There is the interventricular  septum is flattened in systole, consistent with right ventricular pressure  overload.   2. Right ventricular systolic function is severely reduced. The right  ventricular size is mildly enlarged. Tricuspid regurgitation signal is  inadequate for assessing PA pressure.   3. The mitral valve is normal in structure. No evidence of mitral valve  regurgitation.   4. The aortic valve was not well visualized. Aortic valve regurgitation  is not visualized. Aortic valve sclerosis is present, with no evidence of  aortic valve stenosis.   Comparison(s): Prior images reviewed side by side. The right ventricular  hypertrophy is significantly worse.   Patient Profile  .  MICKELL BIRDWELL is a 61 y.o. male with PAD s/p right AKA and multiple prior  interventions, nonobstructive CAD, mild to moderate AI,  and Libman-Sacks endocarditis (echo 2013), and RA who is being seen 01/31/2022 for the evaluation of cardiac arrest at the request of Dr. Regenia Skeeter.  Assessment & Plan    1: Cardiac arrest-patient alert and oriented today.  Extubated on nasal cannula.  On low-dose pressors with good blood pressure.  2D echo was normal.  Troponins were elevated at 7479.  EKG yesterday showed subtle inferior ST segment elevation.  The etiology for his cardiac arrest is still undetermined but most likely ischemically mediated VF.  He will need diagnostic coronary angiography in the  next several days once he improves cognitively and he is off pressors.  2: DVT-venous Dopplers done 01/10/2022 showed bilateral lower extremity DVTs.  He was on a DOAC which has been held.  He is currently on heparin.  3: PAD-status post left AKA.  He was on clopidogrel.  4: CVA-subacute occipital CVA several weeks ago on clopidogrel  5: AKA-creatinine on admission was 1.36, now 2.1.  I suspect this is from hypoperfusion.  Will continue to follow.  Patient amazingly alert 1 day after witnessed sudden cardiac arrest with 30 minutes of CPR before ROSC.  His troponins were up to 7500.  2D echo was normal.  He will need coronary angiography to define his anatomy once he becomes more clinically stable.     For questions or updates, please contact [place] Please consult [email or web address] for contact info under        Signed, Quay Burow, MD  02/01/2022, 8:47 AM

## 2022-02-01 NOTE — Progress Notes (Signed)
Limited 2D echocardiogram with [place] completed.  02/01/2022 11:26 AM Kelby Aline., MHA, RVT, RDCS, RDMS

## 2022-02-01 NOTE — IPAL (Signed)
  Interdisciplinary Goals of Care Family Meeting   Date carried out: 02/01/2022  Location of the meeting: Bedside  Member's involved: Physician and Bedside Registered Nurse  Durable Power of Attorney or acting medical decision maker: Patient    Discussion: We discussed goals of care for [name] [name] .   Given his remarkable improvement, we discussed what to do in case of recurrent arrest. He would like to be full code.  Code status: Full Code  Disposition: Continue current acute care  Time spent for the meeting: 2 minutes    Candee Furbish, MD  02/01/2022, 3:34 PM

## 2022-02-01 NOTE — Progress Notes (Signed)
[facility] Progress Note Patient Name: [name] [name] DOB: 1961/01/30 MRN: [id number]   Date of Service  02/01/2022  HPI/Events of Note  Patient remains sub-optimally sedated and hypotensive. Lactic acid is elevated, and echocardiogram is consistent with RV shock.  eICU Interventions  Ceiling on Norepinephrine gtt increased to 20 mcg and PCCM ground crew requested to come and place a central line, Ceiling on Fentanyl gtt increased to 300 mcg, and Versed gtt ordered with a ceiling of 5 mg / hour. If blood pressure more stable and central line in place, will consider low dose Milrinone gtt for RV shock.        [name] [name] 02/01/2022, 12:21 AM

## 2022-02-01 NOTE — Procedures (Signed)
Central Venous Catheter Insertion Procedure Note  [name] [name]  [id number]  [birth year]  Date:02/01/22  Time:5:39 AM   Provider Performing:Shyanne Mcclary Jerilynn Mages Ayesha Rumpf   Procedure: Insertion of Non-tunneled Central Venous Catheter(36556) with US guidance (87564)   Indication(s) Medication administration and Difficult access  Consent Unable to obtain consent due to emergent nature of procedure.  Anesthesia Topical only with 1% lidocaine   Timeout Verified patient identification, verified procedure, site/side was marked, verified correct patient position, special equipment/implants available, medications/allergies/relevant history reviewed, required imaging and test results available.  Sterile Technique Maximal sterile technique including full sterile barrier drape, hand hygiene, sterile gown, sterile gloves, mask, hair covering, sterile ultrasound probe cover (if used).  Procedure Description Area of catheter insertion was cleaned with chlorhexidine and draped in sterile fashion.  With real-time ultrasound guidance a central venous catheter was placed into the left internal jugular vein. Nonpulsatile blood flow and easy flushing noted in all ports.  The catheter was sutured in place and sterile dressing applied.     Complications/Tolerance None; patient tolerated the procedure well. Chest X-ray is ordered to verify placement for internal jugular or subclavian cannulation.   Chest x-ray is not ordered for femoral cannulation.  EBL Minimal  Specimen(s) None  Lestine Mount, PA-C [facility] 02/01/22 5:40 AM  Please see [email or web address] for pager details.  From 7A-7P if no response, please call [phone] After hours, please call ELink [phone]

## 2022-02-01 NOTE — Progress Notes (Signed)
[facility] Progress Note Patient Name: [name] [name] DOB: 16-Jul-1960 MRN: [id number]   Date of Service  02/01/2022  HPI/Events of Note  Patient c/o L shoulder pain. Patient is c/p cardiac arrest with CPR for 35 minutes and has multiple broken ribs and sternum. Patient takes Percocet at home for chronic pain, however, AST and ALT are both elevated.   eICU Interventions  Plan: Oxycodone IR 5 mg PO Q 4 hours PRN pain. ECG STAT.      Intervention Category Major Interventions: Other:  Lysle Dingwall 02/01/2022, 10:48 PM

## 2022-02-01 NOTE — Procedures (Signed)
Extubation Procedure Note  Patient Details:   Name: [name] [name] DOB: [birth year] MRN: [id number]   Airway Documentation:    Vent end date: 02/01/22 Vent end time: 0814   Evaluation  O2 sats: stable throughout Complications: No apparent complications Patient did tolerate procedure well. Bilateral Breath Sounds: Rhonchi, Diminished   Yes Pt was extubated with no apparent complications. Audible cuffleak was heard prior extubating and no signs of stridor at this time. Pt is currently stable on 6L [place] and is able to speak.  Felecia Jan 02/01/2022, 8:16 AM

## 2022-02-01 NOTE — Progress Notes (Addendum)
NAME:  [name] [name], MRN:  [id number], DOB:  03/05/1961, LOS: 1 ADMISSION DATE:  01/31/2022, CONSULTATION DATE:  8/8 REFERRING MD:  Verta Ellen, CHIEF COMPLAINT:  cardiac arrest    History of Present Illness:  This is a 61 year old male patient who still resides at home however does have home health care assistance with a private care provider.  Most recently seen in consultation in the emergency room for acute lower extremity DVT and started on DOAC.  Presents to the emergency room on 8/8 via EMS.  Had previously been complaining of lower extremity pain then suddenly became unresponsive and pulseless.  Apparently CPR was started by bystander, on EMS arrival the patient was pulseless, CPR was initiated, was found to be in ventricular tachycardia underwent a total of 3 defibrillations and also given 300 mg of amiodarone.  Subsequent ROSC achieved estimated at 35 minutes. Arrived in the emergency room with pulse on epinephrine infusion EKG showing ST elevation GCS 3 seen by cardiology felt not candidate for left heart cath given severity of critical illness.  Critical care asked to admit Pertinent  Medical History  Peripheral arterial disease, status post acute on chronic limb ischemia of the left lower extremity ultimately requiring left AKA in 2021, ongoing partially occlusive clot in the external iliac vein and femoral vein. Recent right popliteal and tibial DVT initiated on DOAC Tobacco abuse Hypercholesterolemia Hypertension Degenerative joint disease, GERD, heart murmur, anemia, lupus, rheumatoid arthritis Wheelchair dependent at baseline, has a caregiver who assists with care at home  [facility] Events: Including procedures, antibiotic start and stop dates in addition to other pertinent events   8/8 admitted status post cardiac arrest.  Had been defibrillated x3.  Started on heparin drip, did receive an amiodarone in the field GCS 3 on arrival, normothermia protocol initiated. CT  angio acute segmental PE to LL lobes and RU lobe. No RV strain. Multiple rib fractures involving 2-7 bilaterally non-displaced mid sternal rx. Bilateral gg airspace disease w/ ddx being post resuscitation contusions vs aspiration> CT head late subacute to chronic appearing left inferior occipital lobe infarct no acute findings.   Interim History / Subjective:  Awake following commands, wants tube out.  Objective   Blood pressure 110/87, pulse (!) 102, temperature 98.9 F (37.2 C), resp. rate 18, height '5\' 8"'$  (1.727 m), weight 73.8 kg, SpO2 99 %.    Vent Mode: PRVC FiO2 (%):  [40 %-100 %] 40 % Set Rate:  [24 bmp-30 bmp] 30 bmp Vt Set:  [540 mL] 540 mL PEEP:  [8 cmH20] 8 cmH20 Plateau Pressure:  [19 cmH20-23 cmH20] 23 cmH20   Intake/Output Summary (Last 24 hours) at 02/01/2022 6073 Last data filed at 02/01/2022 0700 Gross per 24 hour  Intake 4279.77 ml  Output 1100 ml  Net 3179.77 ml   Filed Weights   01/31/22 1400 02/01/22 0500  Weight: 80.7 kg 73.8 kg    Examination: Chronically ill appearing Moves all ext to command Lungs with rhonci No edema Ext warm  CXR looks good Acute liver injury improved Cr worse  Resolved Hospital Problem list     Assessment & Plan:  OHCA Vfib with ROSC Acute on suspected chronic RV failure Post arrest shock, encephalopathy, acidemia- all improved Post arrest AKI- slightly worse PE- question chronic, small, would be odd to be proximal cause of arrest, TPA held due to recent stroke PVD post L AKA Wheelchair bound Acute hypoxemic respiratory failure- aspiration PNA on CT, small PE Hypo-K/Mg Acute liver injury  Proximal cause of arrest unclear, his RV dysfunction was exaggerated by his acidemia and hypoxemia.  - Heparin drip - Avoid nephrotoxins - Watch for renal recovery - Wean to extubate, progressive mobility - DC bicarb drip - Unasyn for aspiration - Repeat limited echo - Eventual ischemic eval if/when kidney function stabilizes -  Continue PTA plavix - Start aspirin - Will await med rec before restarting home meds  Best Practice (right click and "Reselect all SmartList Selections" daily)   Diet/type: NPO DVT prophylaxis: systemic heparin GI prophylaxis: PPI Lines: N/A Foley:  Yes, and it is still needed Code Status:  full code Last date of multidisciplinary goals of care discussion [pending]  Patient critically ill due to resp failure, shock Interventions to address this today pressor, vent titration Risk of deterioration without these interventions is high  I personally spent 39 minutes providing critical care not including any separately billable procedures  Erskine Emery MD [facility]  Prefer epic messenger for cross cover needs If after hours, please call E-link

## 2022-02-01 NOTE — Progress Notes (Signed)
ANTICOAGULATION CONSULT NOTE   Pharmacy Consult for Heparin infusion Indication:  STEMI , DVT, b/l PE  Allergies  Allergen Reactions   Dilaudid [Hydromorphone Hcl] Rash   Ramipril Rash and Other (See Comments)    Per patient on R arm and leg only; no angioedema   Tramadol Itching and Rash    Patient Measurements: Height: '5\' 8"'$  (172.7 cm) Weight: 73.8 kg (162 lb 11.2 oz) IBW/kg (Calculated) : 68.4 Heparin Dosing Weight: 80 kg  Vital Signs: Temp: 98.2 F (36.8 C) (08/09 1603) Temp Source: Oral (08/09 1603) BP: 98/74 (08/09 1330) Pulse Rate: 87 (08/09 1700)  Labs: Recent Labs     0000 01/31/22 1306 01/31/22 1340 01/31/22 1354 01/31/22 1426 01/31/22 1843 01/31/22 2041 02/01/22 0134 02/01/22 0414 02/01/22 1625  HGB  --  13.2  --   --  13.3  --   --  13.3 12.2*  --   HCT  --  46.3  --   --  39.0  --   --  39.0 36.9*  --   PLT  --  142*  --   --   --   --   --   --  195  --   APTT   < >  --  147*  --   --   --  42*  --  33 60*  LABPROT  --   --  17.6*  --   --   --   --   --   --   --   INR  --   --  1.5*  --   --   --   --   --   --   --   HEPARINUNFRC  --   --   --   --   --  >1.10*  --   --  0.81* 0.58  CREATININE  --  1.36*  --    < >  --  1.59*  --   --  2.11* 2.33*  TROPONINIHS  --  2,599*  --   --   --  7,479*  --   --   --   --    < > = values in this interval not displayed.     Estimated Creatinine Clearance: 32.2 mL/min (A) (by C-G formula based on SCr of 2.33 mg/dL (H)).   Assessment: 61 yo M BIBEMS s/p CPR with ROSC after 30 minutes. ECG on presentation shows STEMI. Per dispense history and chart review, pt was initiated on apixaban for treatment of VTE on 01/23/22. Unsure last dose. Apixaban affecting heparin levels (>1.1) - utillizing aPTT for monitoring until levels correlate.  Pt on heparin gtt for STEMI, preexisting DVT, and also with b/l PE on CTA. Heparin rate was increased this morning, however access was lost at 1000 this AM. Heparin was  restarted ~1100. Will bolus and retime labs.  Goal of Therapy:  Heparin level 0.3-0.7 units/ml aPTT 66-102 seconds Monitor platelets by anticoagulation protocol: Yes    Plan:  Initiate a heparin bolus of 1500 units  Increase heparin infusion to 1450 units/hr F/u aPTT and heparin level at 0100  Thank you for allowing pharmacy to participate in this patient's care.  Vicenta Dunning, PharmD  PGY1 Pharmacy Resident

## 2022-02-01 NOTE — Progress Notes (Signed)
[facility] Progress Note Patient Name: [name] DOB: December 01, 1960 MRN: [id number]   Date of Service  02/01/2022  HPI/Events of Note  Sub-optimal sedation on 5 mg of Versed and 300 mcg of Fentanyl.  eICU Interventions  Versed discontinued and Propofol substituted ,with the plan being to run low dose propofol.        Kerry Kass Mccrae Speciale 02/01/2022, 2:35 AM

## 2022-02-01 NOTE — Progress Notes (Signed)
Bedside swallow eval complete.  No problems visualized when pt ate spoonful of applesauce and then water.

## 2022-02-02 DIAGNOSIS — R570 Cardiogenic shock: Secondary | ICD-10-CM | POA: Diagnosis not present

## 2022-02-02 DIAGNOSIS — I739 Peripheral vascular disease, unspecified: Secondary | ICD-10-CM | POA: Diagnosis not present

## 2022-02-02 DIAGNOSIS — I469 Cardiac arrest, cause unspecified: Secondary | ICD-10-CM | POA: Diagnosis not present

## 2022-02-02 LAB — GLUCOSE, CAPILLARY
Glucose-Capillary: 103 mg/dL — ABNORMAL HIGH (ref 70–99)
Glucose-Capillary: 110 mg/dL — ABNORMAL HIGH (ref 70–99)
Glucose-Capillary: 114 mg/dL — ABNORMAL HIGH (ref 70–99)
Glucose-Capillary: 135 mg/dL — ABNORMAL HIGH (ref 70–99)
Glucose-Capillary: 135 mg/dL — ABNORMAL HIGH (ref 70–99)
Glucose-Capillary: 161 mg/dL — ABNORMAL HIGH (ref 70–99)
Glucose-Capillary: 89 mg/dL (ref 70–99)

## 2022-02-02 LAB — BASIC METABOLIC PANEL
Anion gap: 12 (ref 5–15)
BUN: 34 mg/dL — ABNORMAL HIGH (ref 8–23)
CO2: 23 mmol/L (ref 22–32)
Calcium: 7.3 mg/dL — ABNORMAL LOW (ref 8.9–10.3)
Chloride: 102 mmol/L (ref 98–111)
Creatinine, Ser: 2.38 mg/dL — ABNORMAL HIGH (ref 0.61–1.24)
GFR, Estimated: 30 mL/min — ABNORMAL LOW (ref >60)
Glucose, Bld: 113 mg/dL — ABNORMAL HIGH (ref 70–99)
Potassium: 3.3 mmol/L — ABNORMAL LOW (ref 3.5–5.1)
Sodium: 137 mmol/L (ref 135–145)

## 2022-02-02 LAB — MAGNESIUM: Magnesium: 2.2 mg/dL (ref 1.7–2.4)

## 2022-02-02 LAB — HEPARIN LEVEL (UNFRACTIONATED): Heparin Unfractionated: 0.54 IU/mL (ref 0.30–0.70)

## 2022-02-02 LAB — APTT: aPTT: 77 seconds — ABNORMAL HIGH (ref 24–36)

## 2022-02-02 MED ORDER — POLYETHYLENE GLYCOL 3350 17 G PO PACK
17.0000 g | PACK | Freq: Every day | ORAL | Status: DC
  Administered 2022-02-05 – 2022-02-07 (×2): 17 g via ORAL
  Filled 2022-02-02 (×4): qty 1

## 2022-02-02 MED ORDER — CALCIUM GLUCONATE-NACL 1-0.675 GM/50ML-% IV SOLN
1.0000 g | Freq: Once | INTRAVENOUS | Status: AC
Start: 2022-02-02 — End: 2022-02-02
  Administered 2022-02-02: 1000 mg via INTRAVENOUS
  Filled 2022-02-02: qty 50

## 2022-02-02 MED ORDER — ATORVASTATIN CALCIUM 80 MG PO TABS
80.0000 mg | ORAL_TABLET | Freq: Every day | ORAL | Status: DC
  Administered 2022-02-03: 80 mg via ORAL
  Filled 2022-02-02: qty 1

## 2022-02-02 MED ORDER — DOCUSATE SODIUM 100 MG PO CAPS
100.0000 mg | ORAL_CAPSULE | Freq: Two times a day (BID) | ORAL | Status: DC
Start: 2022-02-02 — End: 2022-02-09
  Administered 2022-02-02 – 2022-02-08 (×8): 100 mg via ORAL
  Filled 2022-02-02 (×10): qty 1

## 2022-02-02 MED ORDER — SODIUM CHLORIDE 0.9 % IV SOLN
250.0000 mL | INTRAVENOUS | Status: DC | PRN
  Administered 2022-02-05: 250 mL via INTRAVENOUS

## 2022-02-02 MED ORDER — CLOPIDOGREL BISULFATE 75 MG PO TABS
75.0000 mg | ORAL_TABLET | Freq: Every day | ORAL | Status: DC
Start: 2022-02-02 — End: 2022-02-09
  Administered 2022-02-02 – 2022-02-08 (×7): 75 mg via ORAL
  Filled 2022-02-02 (×7): qty 1

## 2022-02-02 MED ORDER — ATORVASTATIN CALCIUM 80 MG PO TABS: 80.0000 mg | ORAL_TABLET | ORAL | Status: DC

## 2022-02-02 MED ORDER — NOREPINEPHRINE 4 MG/250ML-% IV SOLN: 0.0000 ug/min | INTRAVENOUS | Status: DC

## 2022-02-02 MED ORDER — SODIUM CHLORIDE 0.9% FLUSH
3.0000 mL | Freq: Two times a day (BID) | INTRAVENOUS | Status: DC
  Administered 2022-02-02 – 2022-02-05 (×5): 3 mL via INTRAVENOUS

## 2022-02-02 MED ORDER — SODIUM CHLORIDE 0.45 % IV SOLN: INTRAVENOUS | Status: DC

## 2022-02-02 MED ORDER — SODIUM CHLORIDE 0.9 % IV SOLN
INTRAVENOUS | Status: AC
Start: 2022-02-02 — End: 2022-02-03

## 2022-02-02 MED ORDER — OXYCODONE-ACETAMINOPHEN 7.5-325 MG PO TABS
1.0000 | ORAL_TABLET | ORAL | Status: DC | PRN
  Administered 2022-02-02 – 2022-02-03 (×3): 1 via ORAL
  Filled 2022-02-02 (×3): qty 1

## 2022-02-02 MED ORDER — POTASSIUM CHLORIDE CRYS ER 20 MEQ PO TBCR
40.0000 meq | EXTENDED_RELEASE_TABLET | Freq: Once | ORAL | Status: AC
  Administered 2022-02-02: 40 meq via ORAL
  Filled 2022-02-02: qty 2

## 2022-02-02 MED ORDER — SODIUM CHLORIDE 0.9% FLUSH: 3.0000 mL | INTRAVENOUS | Status: DC | PRN

## 2022-02-02 MED ORDER — DULOXETINE HCL 30 MG PO CPEP
30.0000 mg | ORAL_CAPSULE | Freq: Every day | ORAL | Status: DC
  Administered 2022-02-02 – 2022-02-08 (×7): 30 mg via ORAL
  Filled 2022-02-02 (×7): qty 1

## 2022-02-02 MED ORDER — ASPIRIN 81 MG PO CHEW
81.0000 mg | CHEWABLE_TABLET | ORAL | Status: AC
  Administered 2022-02-03: 81 mg via ORAL
  Filled 2022-02-02: qty 1

## 2022-02-02 MED ORDER — POTASSIUM CHLORIDE 20 MEQ PO PACK
40.0000 meq | PACK | Freq: Once | ORAL | Status: AC
  Administered 2022-02-02: 40 meq
  Filled 2022-02-02: qty 2

## 2022-02-02 NOTE — Evaluation (Signed)
Physical Therapy Evaluation Patient Details Name: [name] [name] MRN: [id number] DOB: 1961/04/07 Today's Date: 02/02/2022  History of Present Illness  61 yo admitted 8/8 after cardiac arrest at home with 35 min CPR and 3 shocks for ROSC. Non displaced sternal fx and bil rib fx 2-7. Intubated 8/8-8/9. PMHx: PAD, Lt AKA, DVT, HLD, HTN, DJD, GERD, lupus, RA, anemia  Clinical Impression  Pt pleasant and eager to get OOB. Pt reports living at home with several friends that can assist as needed and he normally walks with prosthesis and RW.  Pt with decreased strength and transfers this session requiring physical assist for all mobility and pivot to chair. Pt encouraged to be OOB daily and agreeable to plan. Pt encouraged to have friends bring prosthesis for increased mobility.  BP 97/74 HR 92 94% on 4L     Recommendations for follow up therapy are one component of a multi-disciplinary discharge planning process, led by the attending physician.  Recommendations may be updated based on patient status, additional functional criteria and insurance authorization.  Follow Up Recommendations Home health PT      Assistance Recommended at Discharge Frequent or constant Supervision/Assistance  Patient can return home with the following  A little help with walking and/or transfers;A little help with bathing/dressing/bathroom;Assistance with cooking/housework;Assist for transportation    Equipment Recommendations None recommended by PT  Recommendations for Other Services       Functional Status Assessment Patient has had a recent decline in their functional status and demonstrates the ability to make significant improvements in function in a reasonable and predictable amount of time.     Precautions / Restrictions Precautions Precautions: Fall Precaution Comments: Lt BKA, has prosthesis but not present      Mobility  Bed Mobility Overal bed mobility: Needs Assistance Bed Mobility: Rolling,  Sidelying to Sit Rolling: Mod assist Sidelying to sit: Mod assist       General bed mobility comments: physical assist to roll, use of rail and assist to rise from surface    Transfers Overall transfer level: Needs assistance   Transfers: Bed to chair/wheelchair/BSC       Squat pivot transfers: Min assist     General transfer comment: min assist for 3 sequential scoots to complete transition from bed to chair with partial rise and squat, assist for setup    Ambulation/Gait                  Stairs            Wheelchair Mobility    Modified Rankin (Stroke Patients Only)       Balance Overall balance assessment: Needs assistance Sitting-balance support: No upper extremity supported, Feet supported Sitting balance-Leahy Scale: Fair                                       Pertinent Vitals/Pain Pain Assessment Pain Assessment: 0-10 Pain Score: 7  Pain Location: back Pain Descriptors / Indicators: Aching, Guarding Pain Intervention(s): Limited activity within patient's tolerance, Monitored during session, Repositioned, Premedicated before session    Remy expects to be discharged to:: Private residence Living Arrangements: Non-relatives/Friends Available Help at Discharge: Friend(s);Available 24 hours/day Type of Home: House Home Access: Ramped entrance       Home Layout: One level Home Equipment: Tub bench;Grab bars - tub/shower;Rolling Walker (2 wheels);Wheelchair - manual;BSC/3in1;Hand held shower head  Prior Function Prior Level of Function : Needs assist             Mobility Comments: pt performs scoot pivot, stand pivot with prosthesis or walking with RW and prosthesis ADLs Comments: friends assist with cooking and cleaning, still drives     Hand Dominance        Extremity/Trunk Assessment   Upper Extremity Assessment Upper Extremity Assessment: Generalized weakness    Lower Extremity  Assessment Lower Extremity Assessment: LLE deficits/detail LLE Deficits / Details: AKA    Cervical / Trunk Assessment Cervical / Trunk Assessment: Normal  Communication      Cognition Arousal/Alertness: Awake/alert Behavior During Therapy: WFL for tasks assessed/performed Overall Cognitive Status: Impaired/Different from baseline Area of Impairment: Safety/judgement, Orientation                 Orientation Level: Disoriented to, Time       Safety/Judgement: Decreased awareness of safety, Decreased awareness of deficits     General Comments: pt stating he normally gets up on his own, significant struggle today with limited awareness        General Comments      Exercises     Assessment/Plan    PT Assessment Patient needs continued PT services  PT Problem List Decreased strength;Decreased mobility;Decreased activity tolerance;Decreased balance;Decreased knowledge of use of DME;Pain       PT Treatment Interventions Gait training;DME instruction;Therapeutic exercise;Balance training;Functional mobility training;Therapeutic activities;Patient/family education;Cognitive remediation    PT Goals (Current goals can be found in the Care Plan section)  Acute Rehab PT Goals Patient Stated Goal: go home tomorrow PT Goal Formulation: With patient Time For Goal Achievement: 02/16/22 Potential to Achieve Goals: Fair    Frequency Min 3X/week     Co-evaluation               AM-PAC PT "6 Clicks" Mobility  Outcome Measure Help needed turning from your back to your side while in a flat bed without using bedrails?: A Lot Help needed moving from lying on your back to sitting on the side of a flat bed without using bedrails?: A Lot Help needed moving to and from a bed to a chair (including a wheelchair)?: A Little Help needed standing up from a chair using your arms (e.g., wheelchair or bedside chair)?: Total Help needed to walk in hospital room?: Total Help needed  climbing 3-5 steps with a railing? : Total 6 Click Score: 10    End of Session   Activity Tolerance: Patient tolerated treatment well Patient left: in chair;with call bell/phone within reach;with chair alarm set Nurse Communication: Mobility status PT Visit Diagnosis: Other abnormalities of gait and mobility (R26.89);Difficulty in walking, not elsewhere classified (R26.2);Muscle weakness (generalized) (M62.81)    Time: 9747-1855 PT Time Calculation (min) (ACUTE ONLY): 25 min   Charges:   PT Evaluation $PT Eval Moderate Complexity: 1 Mod PT Treatments $Therapeutic Activity: 8-22 mins        Bayard Males, PT Acute Rehabilitation Services Office: [phone]   Lamarr Lulas 02/02/2022, 8:54 AM

## 2022-02-02 NOTE — Progress Notes (Signed)
ANTICOAGULATION CONSULT NOTE   Pharmacy Consult for Heparin infusion Indication:  STEMI , DVT, b/l PE  Allergies  Allergen Reactions   Dilaudid [Hydromorphone Hcl] Rash   Ramipril Rash and Other (See Comments)    Per patient on R arm and leg only; no angioedema   Tramadol Itching and Rash    Patient Measurements: Height: '5\' 8"'$  (172.7 cm) Weight: 73.8 kg (162 lb 11.2 oz) IBW/kg (Calculated) : 68.4 Heparin Dosing Weight: 80 kg  Vital Signs: Temp: 98.2 F (36.8 C) (08/10 0016) Temp Source: Oral (08/10 0016) BP: 92/64 (08/10 0000) Pulse Rate: 100 (08/10 0000)  Labs: Recent Labs    01/31/22 1306 01/31/22 1340 01/31/22 1354 01/31/22 1426 01/31/22 1843 01/31/22 2041 02/01/22 0134 02/01/22 0414 02/01/22 1625 02/02/22 0101  HGB 13.2  --   --  13.3  --   --  13.3 12.2*  --   --   HCT 46.3  --   --  39.0  --   --  39.0 36.9*  --   --   PLT 142*  --   --   --   --   --   --  195  --   --   APTT  --  147*  --   --   --    < >  --  33 60* 77*  LABPROT  --  17.6*  --   --   --   --   --   --   --   --   INR  --  1.5*  --   --   --   --   --   --   --   --   HEPARINUNFRC  --   --    < >  --  >1.10*  --   --  0.81* 0.58 0.54  CREATININE 1.36*  --    < >  --  1.59*  --   --  2.11* 2.33* 2.38*  TROPONINIHS 2,599*  --   --   --  7,479*  --   --   --   --   --    < > = values in this interval not displayed.     Estimated Creatinine Clearance: 31.5 mL/min (A) (by C-G formula based on SCr of 2.38 mg/dL (H)).  Assessment: 61 yo male admitted with STEMI, h/o VTE and Eliquis on hold, for heparin.  Goal of Therapy:  Heparin level 0.3-0.7 units/mL Monitor platelets by anticoagulation protocol: Yes   Plan:  Continue Heparin at current rate   Phillis Knack, PharmD, BCPS

## 2022-02-02 NOTE — Progress Notes (Signed)
Progress Note  Patient Name: [name] [name] Date of Encounter: 02/02/2022  [facility] Cardiologist: Candee Furbish, MD   Subjective   Day 2  post witnessed cardiac arrest with CPR.  Patient extubated yesterday morning.  Per currently on nasal cannula with good sats.  He is alert and oriented.  He complains of chest pain most likely from CPR.  Inpatient Medications    Scheduled Meds:  aspirin  81 mg Oral Daily   atorvastatin  80 mg Oral Daily   chlorhexidine  15 mL Mouth/Throat Once   Chlorhexidine Gluconate Cloth  6 each Topical Daily   clopidogrel  75 mg Per Tube Daily   docusate  100 mg Per Tube BID   insulin aspart  0-15 Units Subcutaneous Q4H   pantoprazole sodium  40 mg Per Tube Daily   polyethylene glycol  17 g Per Tube Daily   potassium chloride  40 mEq Oral Once   Continuous Infusions:  sodium chloride     sodium chloride 10 mL/hr at 02/02/22 0700   sodium chloride     amiodarone 30 mg/hr (02/02/22 0700)   ampicillin-sulbactam (UNASYN) IV Stopped (02/02/22 0429)   heparin 1,450 Units/hr (02/02/22 0700)   norepinephrine (LEVOPHED) Adult infusion     PRN Meds: ondansetron (ZOFRAN) IV, mouth rinse, oxyCODONE   Vital Signs    Vitals:   02/02/22 0400 02/02/22 0445 02/02/22 0500 02/02/22 0600  BP: 99/73  (!) 123/59 (!) 109/53  Pulse: 100  88 88  Resp: (!) 31  17 (!) 22  Temp:  98 F (36.7 C)    TempSrc:  Oral    SpO2: 97%  99% 99%  Weight:   74.1 kg   Height:        Intake/Output Summary (Last 24 hours) at 02/02/2022 0807 Last data filed at 02/02/2022 0700 Gross per 24 hour  Intake 1796.6 ml  Output 725 ml  Net 1071.6 ml      02/02/2022    5:00 AM 02/01/2022    5:00 AM 01/31/2022    2:00 PM  Last 3 Weights  Weight (lbs) 163 lb 5.8 oz 162 lb 11.2 oz 178 lb  Weight (kg) 74.1 kg 73.8 kg 80.74 kg      Telemetry    Sinus rhythm PVCs- Personally Reviewed  ECG    Not performed today- Personally Reviewed  Physical Exam   GEN: No acute distress.    Neck: No JVD Cardiac: RRR, no murmurs, rubs, or gallops.  Respiratory: Clear to auscultation bilaterally. GI: Soft, nontender, non-distended  MS: No edema; No deformity. Neuro:  Nonfocal  Psych: Normal affect, alert, awake and oriented  Labs    High Sensitivity Troponin:   Recent Labs  Lab 01/31/22 1306 01/31/22 1843  TROPONINIHS 2,599* 7,479*     Chemistry Recent Labs  Lab 01/31/22 1306 01/31/22 1354 01/31/22 1843 02/01/22 0134 02/01/22 0414 02/01/22 1625 02/02/22 0101  NA 140   < > 140   < > 139 137 137  K 4.2   < > 3.9   < > 3.5 3.5 3.3*  CL 107  --  107  --  101 101 102  CO2 10*  --  19*  --  '22 24 23  '$ GLUCOSE 305*  --  236*  --  156* 127* 113*  BUN 17  --  23  --  29* 32* 34*  CREATININE 1.36*   < > 1.59*  --  2.11* 2.33* 2.38*  CALCIUM 8.9  --  8.5*  --  7.7* 7.2* 7.3*  MG  --   --   --   --  1.4* 2.0  --   PROT 5.8*  --  6.9  --  6.1*  --   --   ALBUMIN 2.6*  --  3.0*  --  2.8*  --   --   AST 416*  --  598*  --  319*  --   --   ALT 406*  --  462*  --  297*  --   --   ALKPHOS 173*  --  228*  --  178*  --   --   BILITOT 0.3  --  0.5  --  0.5  --   --   GFRNONAA 59*  --  49*  --  35* 31* 30*  ANIONGAP 23*  --  14  --  16* 12 12   < > = values in this interval not displayed.    Lipids  Recent Labs  Lab 01/31/22 1306 02/01/22 0414  CHOL 165  --   TRIG 279* 226*  HDL 19*  --   LDLCALC 90  --   CHOLHDL 8.7  --     Hematology Recent Labs  Lab 01/31/22 1306 01/31/22 1426 02/01/22 0134 02/01/22 0414  WBC 12.9*  --   --  15.1*  RBC 5.08  --   --  4.68  HGB 13.2 13.3 13.3 12.2*  HCT 46.3 39.0 39.0 36.9*  MCV 91.1  --   --  78.8*  MCH 26.0  --   --  26.1  MCHC 28.5*  --   --  33.1  RDW 17.0*  --   --  16.6*  PLT 142*  --   --  195   Thyroid No results for input(s): "TSH", "FREET4" in the last 168 hours.  BNPNo results for input(s): "BNP", "PROBNP" in the last 168 hours.  DDimer No results for input(s): "DDIMER" in the last 168 hours.    Radiology    ECHOCARDIOGRAM LIMITED  Result Date: 02/01/2022    ECHOCARDIOGRAM LIMITED REPORT   Patient Name:   ZARIN KNUPP Date of Exam: 02/01/2022 Medical Rec #:  [id number]     Height:       68.0 in Accession #:    [id number]    Weight:       162.7 lb Date of Birth:  [birth year]    BSA:          1.872 m Patient Age:    58 years      BP:           110/87 mmHg Patient Gender: M             HR:           110 bpm. Exam Location:  Inpatient Procedure: Limited Echo, Cardiac Doppler, Color Doppler and Intracardiac            Opacification Agent Indications:    Cardiac arrest  History:        Patient has prior history of Echocardiogram examinations, most                 recent 01/31/2022. PAD; Risk Factors:Hypertension and Current                 Smoker.  Sonographer:    Maudry Mayhew MHA, RDMS, RVT, RDCS Referring Phys: [id number] Candee Furbish  Sonographer Comments: Image acquisition challenging due to respiratory motion. IMPRESSIONS  1. Left ventricular ejection fraction, by estimation, is 60 to 65%. The left ventricle has normal function. Left ventricular diastolic parameters are indeterminate. There is the interventricular septum is flattened in systole, consistent with right ventricular pressure overload.  2. The Right ventricle is severly dilated with hypokinesis in the basal and mid portion but hyperkinesis in the apical portion - consistent with McConnell's sign which was also present in the TTE performed 01/31/22. Right ventricular systolic function is moderately reduced. The right ventricular size is severely enlarged. There is normal pulmonary artery systolic pressure.  3. The mitral valve is grossly normal. No evidence of mitral valve regurgitation. No evidence of mitral stenosis.  4. The aortic valve is grossly normal. Aortic valve regurgitation is trivial. Aortic valve sclerosis is present, with no evidence of aortic valve stenosis.  5. The inferior vena cava is normal in size with greater than 50%  respiratory variability, suggesting right atrial pressure of 3 mmHg. FINDINGS  Left Ventricle: Left ventricular ejection fraction, by estimation, is 60 to 65%. The left ventricle has normal function. Definity contrast agent was given IV to delineate the left ventricular endocardial borders. The left ventricular internal cavity size was small. The interventricular septum is flattened in systole, consistent with right ventricular pressure overload. Left ventricular diastolic parameters are indeterminate.  LV Wall Scoring: The apical lateral segment, apical anterior segment, apical inferior segment, and apex are akinetic. The anterior wall, antero-lateral wall, mid and distal anterior septum, inferior septum, inferior wall, and mid inferolateral segment are normal. Right Ventricle: The Right ventricle is severly dilated with hypokinesis in the basal and mid portion but hyperkinesis in the apical portion - consistent with McConnell's sign which was also present in the TTE performed 01/31/22. The right ventricular size  is severely enlarged. Right ventricular systolic function is moderately reduced. There is normal pulmonary artery systolic pressure. The tricuspid regurgitant velocity is 2.43 m/s, and with an assumed right atrial pressure of 3 mmHg, the estimated right  ventricular systolic pressure is 41.2 mmHg. Left Atrium: Left atrial size was not assessed. Right Atrium: Right atrial size was not assessed. Pericardium: Presence of epicardial fat layer. Mitral Valve: The mitral valve is grossly normal. No evidence of mitral valve stenosis. Tricuspid Valve: The tricuspid valve is grossly normal. Tricuspid valve regurgitation is not demonstrated. No evidence of tricuspid stenosis. Aortic Valve: The aortic valve is grossly normal. Aortic valve regurgitation is trivial. Aortic valve sclerosis is present, with no evidence of aortic valve stenosis. Pulmonic Valve: The pulmonic valve was not well visualized. Aorta: The aortic  root is normal in size and structure and the ascending aorta was not well visualized. Venous: The inferior vena cava is normal in size with greater than 50% respiratory variability, suggesting right atrial pressure of 3 mmHg. LEFT VENTRICLE PLAX 2D LVIDd:         4.70 cm Diastology LVIDs:         3.00 cm LV e' medial:    12.20 cm/s LV PW:         1.10 cm LV E/e' medial:  4.0 LV IVS:        0.90 cm LV e' lateral:   10.70 cm/s                        LV E/e' lateral: 4.6  RIGHT VENTRICLE RV S prime:     9.14 cm/s LEFT ATRIUM         Index LA diam:  3.00 cm 1.60 cm/m   AORTA Ao Root diam: 3.60 cm MITRAL VALVE               TRICUSPID VALVE MV Area (PHT): 3.60 cm    TR Peak grad:   23.6 mmHg MV Decel Time: 211 msec    TR Vmax:        243.00 cm/s MV E velocity: 49.30 cm/s MV A velocity: 71.30 cm/s MV E/A ratio:  0.69 Kardie Tobb DO Electronically signed by Berniece Salines DO Signature Date/Time: 02/01/2022/11:59:14 AM    Final    EEG adult  Result Date: 02/01/2022 Lora Havens, MD     02/01/2022  8:34 AM Patient Name: MAINOR HELLMANN MRN: [id number] Epilepsy Attending: Lora Havens Referring Physician/Provider: Erick Colace, NP Date: 01/31/2022 Duration: 30.34 mins Patient history: 61 year old male status postcardiac arrest.  EEG to evaluate for seizure. Level of alertness: comatose AEDs during EEG study: Propofol Technical aspects: This EEG study was done with scalp electrodes positioned according to the 10-20 International system of electrode placement. Electrical activity was reviewed with band pass filter of 1-'70Hz'$ , sensitivity of 7 uV/mm, display speed of 68m/sec with a '60Hz'$  notched filter applied as appropriate. EEG data were recorded continuously and digitally stored.  Video monitoring was available and reviewed as appropriate. Description: EEG showed continuous generalized 2 to 3 Hz With overriding drop to 40 Hz beta activity.  Intermittent episodes of subtle head jerks were noted without concomitant EEG  change.  Hyperventilation and photic stimulation were not performed.   ABNORMALITY - Continuous slow, generalized IMPRESSION: This study is suggestive of severe diffuse encephalopathy, nonspecific etiology.  Intermittent episodes of head jerk were noted without concomitant EEG change or most likely not epileptic.  No seizures or epileptiform discharges were seen throughout the recording. PLora Havens  DG CHEST PORT 1 VIEW  Result Date: 02/01/2022 CLINICAL DATA:  61year old male central line placement. Pulmonary emboli. EXAM: PORTABLE CHEST 1 VIEW COMPARISON:  Portable chest 01/31/2022 and earlier. FINDINGS: Portable AP semi upright view at 0622 hours. Endotracheal tube and visible enteric tube appears stable. New left IJ approach central line in place. Catheter tip at or just below the carina (SVC level. No pneumothorax. Mediastinal contours are stable and within normal limits. Allowing for portable technique the lungs are now clear, and bilateral ventilation appears substantially improved from the chest CTA yesterday. IMPRESSION: 1. New left IJ approach central line with tip at the SVC level, no adverse features. 2. Otherwise stable lines and tubes. 3. Improved bilateral ventilation since the CTA yesterday. No new pulmonary abnormality. Electronically Signed   By: HGenevie AnnM.D.   On: 02/01/2022 06:33   [place]KG SITE RITE  Result Date: 01/31/2022 If Site Rite image not attached, placement could not be confirmed due to current cardiac rhythm.  DG CHEST PORT 1 VIEW  Result Date: 01/31/2022 CLINICAL DATA:  Pulmonary embolus, intubated EXAM: PORTABLE CHEST 1 VIEW COMPARISON:  823 FINDINGS: Single frontal view of the chest demonstrates endotracheal tube overlying tracheal air column tip well above carina. Enteric catheter passes below diaphragm tip excluded by collimation. The cardiac silhouette is stable. Improved aeration of the left lung since prior study. No focal consolidation, effusion, or pneumothorax.  No acute bony abnormality. IMPRESSION: 1. Support devices as above. 2. Improved aeration of the lungs, with resolution of the left-sided airspace disease seen previously. Electronically Signed   By: MRanda NgoM.D.   On: 01/31/2022 22:13   ECHOCARDIOGRAM  COMPLETE  Result Date: 01/31/2022    ECHOCARDIOGRAM REPORT   Patient Name:   KOBI MARIO Date of Exam: 01/31/2022 Medical Rec #:  [id number]     Height:       68.0 in Accession #:    [id number]    Weight:       178.0 lb Date of Birth:  08-Sep-1960    BSA:          1.945 m Patient Age:    48 years      BP:           130/94 mmHg Patient Gender: M             HR:           100 bpm. Exam Location:  Inpatient Procedure: 2D Echo, Cardiac Doppler and Color Doppler Indications:    Abnormal ECG R94.31  History:        Patient has prior history of Echocardiogram examinations, most                 recent 02/22/2019. PVD, AV Mass, Signs/Symptoms:Murmur; Risk                 Factors:Hypertension. Lupus, Anemia.  Sonographer:    Eartha Inch Referring Phys: [place] Comments: Echo performed with patient supine and on artificial respirator. Image acquisition challenging due to patient body habitus and Image acquisition challenging due to respiratory motion. IMPRESSIONS  1. Left ventricular ejection fraction, by estimation, is 60 to 65%. The left ventricle has normal function. The left ventricle has no regional wall motion abnormalities. Indeterminate diastolic filling due to E-A fusion. There is the interventricular septum is flattened in systole, consistent with right ventricular pressure overload.  2. Right ventricular systolic function is severely reduced. The right ventricular size is mildly enlarged. Tricuspid regurgitation signal is inadequate for assessing PA pressure.  3. The mitral valve is normal in structure. No evidence of mitral valve regurgitation.  4. The aortic valve was not well visualized. Aortic valve regurgitation is not  visualized. Aortic valve sclerosis is present, with no evidence of aortic valve stenosis. Comparison(s): Prior images reviewed side by side. The right ventricular hypertrophy is significantly worse. Conclusion(s)/Recommendation(s): Discussed findings with primary team. FINDINGS  Left Ventricle: Left ventricular ejection fraction, by estimation, is 60 to 65%. The left ventricle has normal function. The left ventricle has no regional wall motion abnormalities. The left ventricular internal cavity size was normal in size. There is  no left ventricular hypertrophy. The interventricular septum is flattened in systole, consistent with right ventricular pressure overload. Indeterminate diastolic filling due to E-A fusion. Right Ventricle: McConnell's sign is present, strongly correlated with acute cor pulmonale, most likely large pulmonary embolism. The right ventricular size is mildly enlarged. No increase in right ventricular wall thickness. Right ventricular systolic function is severely reduced. Tricuspid regurgitation signal is inadequate for assessing PA pressure. Left Atrium: Left atrial size was normal in size. Right Atrium: Right atrial size was normal in size. Pericardium: There is no evidence of pericardial effusion. Mitral Valve: The mitral valve is normal in structure. No evidence of mitral valve regurgitation. Tricuspid Valve: The tricuspid valve is normal in structure. Tricuspid valve regurgitation is not demonstrated. Aortic Valve: The aortic valve was not well visualized. Aortic valve regurgitation is not visualized. Aortic valve sclerosis is present, with no evidence of aortic valve stenosis. Pulmonic Valve: The pulmonic valve was not well visualized. Aorta: The aortic root is normal  in size and structure. Venous: IVC assessment for right atrial pressure unable to be performed due to mechanical ventilation. IAS/Shunts: No atrial level shunt detected by color flow Doppler. Sanda Klein MD Electronically  signed by Sanda Klein MD Signature Date/Time: 01/31/2022/4:17:16 PM    Final    CT Angio Chest PE W and/or Wo Contrast  Result Date: 01/31/2022 CLINICAL DATA:  Pulmonary embolism (PE) suspected, high prob EXAM: CT ANGIOGRAPHY CHEST WITH CONTRAST TECHNIQUE: Multidetector CT imaging of the chest was performed using the standard protocol during bolus administration of intravenous contrast. Multiplanar CT image reconstructions and MIPs were obtained to evaluate the vascular anatomy. RADIATION DOSE REDUCTION: This exam was performed according to the departmental dose-optimization program which includes automated exposure control, adjustment of the mA and/or kV according to patient size and/or use of iterative reconstruction technique. CONTRAST:  179m OMNIPAQUE IOHEXOL 350 MG/ML SOLN COMPARISON:  CTA 06/26/2020. FINDINGS: Cardiovascular: Satisfactory opacification of the pulmonary arteries to the segmental level. There is no acute pulmonary embolism at the branching of the left main pulmonary artery into the segmental lower lobe arteries (series 5, images 65-67, series 8, image [id number]). Additional small filling defect and a right upper lobe segmental artery (series 5, image 57). No central/saddle PE.Cardiomegaly. RV: LV ratio is 0.9. Trace pericardial effusion. Atherosclerosis of the thoracic aorta. Mediastinum/Nodes: No lymphadenopathy. The thyroid is unremarkable. Esophagus is unremarkable. Lungs/Pleura: There are confluent ground-glass opacities in the upper lungs bilaterally with areas of nodular consolidation and interlobular septal thickening. Bibasilar atelectasis. No pleural effusion. No pneumothorax. Mid to Upper Abdomen: No acute abnormality. Musculoskeletal: There are multiple acute anterior rib fractures, involving ribs 2 through 7 bilaterally. Most of these are angulated and nondisplaced. Fractures involving ribs 3 through 5 on the right are mildly displaced. Nondisplaced mid sternal fracture. Review of  the MIP images confirms the above findings. IMPRESSION: Acute segmental pulmonary emboli in the left lower and right upper lobes. No definite CT evidence of right heart strain. Complex ground-glass opacities in the mid to upper lungs bilaterally with areas of nodular consolidation and interlobular septal thickening. Findings are most consistent with contusions related to recent cardiopulmonary resuscitation, versus a multifocal infectious/inflammatory process. Bibasilar atelectasis. Multiple anterior rib fractures involving ribs 2 through 7 bilaterally. Nondisplaced mid sternal fracture. Critical Value/emergent results were called by telephone at the time of interpretation on 01/31/2022 at 2:29 pm to provider SSherwood Gambler, who verbally acknowledged these results. Electronically Signed   By: JMaurine SimmeringM.D.   On: 01/31/2022 14:36   CT Head Wo Contrast  Result Date: 01/31/2022 CLINICAL DATA:  Neuro deficit, acute, stroke suspected EXAM: CT HEAD WITHOUT CONTRAST TECHNIQUE: Contiguous axial images were obtained from the base of the skull through the vertex without intravenous contrast. RADIATION DOSE REDUCTION: This exam was performed according to the departmental dose-optimization program which includes automated exposure control, adjustment of the mA and/or kV according to patient size and/or use of iterative reconstruction technique. COMPARISON:  12/08/2015 FINDINGS: Brain: Late subacute to chronic appearing infarct within the inferior left occipital lobe. Otherwise, no evidence of acute infarction. No hemorrhage, hydrocephalus, extra-axial collection, or mass lesion. Scattered low-density changes within the periventricular and subcortical white matter compatible with chronic microvascular ischemic change. Vascular: Atherosclerotic calcifications involving the large vessels of the skull base. No unexpected hyperdense vessel. Skull: Normal. Negative for fracture or focal lesion. Sinuses/Orbits: Small retention  cyst or polyp in the right maxillary sinus. Other: None. IMPRESSION: 1. Late subacute to chronic appearing infarct within the  inferior left occipital lobe. Follow-up MRI can be performed to more accurately characterize. 2. Otherwise, no acute intracranial abnormality. Electronically Signed   By: Davina Poke D.O.   On: 01/31/2022 14:25   DG Chest Port 1 View  Result Date: 01/31/2022 CLINICAL DATA:  Post CPR EXAM: PORTABLE CHEST 1 VIEW COMPARISON:  Chest x-ray 06/26/2020 FINDINGS: Endotracheal tube in good position. NG tube in the stomach with the tip at the GE junction. Atherosclerotic calcification aortic arch. Right lung is clear. Mild left upper lobe and left lower lobe airspace disease. No left effusion. No acute skeletal abnormality IMPRESSION: Endotracheal tube in good position. NG tip in this stomach with the side hole in the distal esophagus. Mild airspace disease left upper lobe and left lower lobe. Possible atelectasis or aspiration. Electronically Signed   By: Franchot Gallo M.D.   On: 01/31/2022 12:50    Cardiac Studies   2D echocardiogram (01/31/2022)  IMPRESSIONS     1. Left ventricular ejection fraction, by estimation, is 60 to 65%. The  left ventricle has normal function. The left ventricle has no regional  wall motion abnormalities. Indeterminate diastolic filling due to E-A  fusion. There is the interventricular  septum is flattened in systole, consistent with right ventricular pressure  overload.   2. Right ventricular systolic function is severely reduced. The right  ventricular size is mildly enlarged. Tricuspid regurgitation signal is  inadequate for assessing PA pressure.   3. The mitral valve is normal in structure. No evidence of mitral valve  regurgitation.   4. The aortic valve was not well visualized. Aortic valve regurgitation  is not visualized. Aortic valve sclerosis is present, with no evidence of  aortic valve stenosis.   Comparison(s): Prior images  reviewed side by side. The right ventricular  hypertrophy is significantly worse.   Patient Profile  .  JAHKEEM KURKA is a 61 y.o. male with PAD s/p right AKA and multiple prior interventions, nonobstructive CAD, mild to moderate AI,  and Libman-Sacks endocarditis (echo 2013), and RA who is being seen 01/31/2022 for the evaluation of cardiac arrest at the request of Dr. Regenia Skeeter.  Assessment & Plan    1: Cardiac arrest-patient alert and oriented today.  Extubated on nasal cannula.  Pressors have been discontinued.  2D echo revealed normal ejection fraction with anteroapical wall motion abnormality.  Troponins were elevated at 7479.  EKG yesterday showed subtle inferior ST segment elevation.  The etiology for his cardiac arrest is still undetermined but most likely ischemically mediated VF.  He will need diagnostic coronary angiography.  He has a right radial arterial line in.  His serum creatinine is 2.3.  Will begin IV fluids and recheck renal function in the morning.[name]  2: DVT-venous Dopplers done 01/10/2022 showed bilateral lower extremity DVTs.  He was on a DOAC which has been held.  He is currently on heparin.  3: PAD-status post left AKA.  He was on clopidogrel.  4: CVA-subacute occipital CVA several weeks ago on clopidogrel  5: AKA-creatinine on admission was 1.36, now 2.38 I suspect this is from hypoperfusion.  Will start IV fluids and recheck in the a.m.  Patient amazingly alert 1 to 2 days after witnessed sudden cardiac arrest with 30 minutes of CPR before ROSC.  His troponins were up to 7500.  2D echo showed normal EF with an anteroapical wall motion abnormality.  Will hydrate him today in anticipation of diagnostic coronary angiography tomorrow.  He remains on IV heparin.[name]  For questions or updates, please contact [place] Please consult [email or web address] for contact info under        Signed, Quay Burow, MD  02/02/2022, 8:07 AM

## 2022-02-02 NOTE — Progress Notes (Signed)
NAME:  [name] [name], MRN:  [id number], DOB:  Mar 28, 1961, LOS: 2 ADMISSION DATE:  01/31/2022, CONSULTATION DATE:  8/8 REFERRING MD:  Verta Ellen, CHIEF COMPLAINT:  cardiac arrest    History of Present Illness:  This is a 61 year old male patient who still resides at home however does have home health care assistance with a private care provider.  Most recently seen in consultation in the emergency room for acute lower extremity DVT and started on DOAC.  Presents to the emergency room on 8/8 via EMS.  Had previously been complaining of lower extremity pain then suddenly became unresponsive and pulseless.  Apparently CPR was started by bystander, on EMS arrival the patient was pulseless, CPR was initiated, was found to be in ventricular tachycardia underwent a total of 3 defibrillations and also given 300 mg of amiodarone.  Subsequent ROSC achieved estimated at 35 minutes. Arrived in the emergency room with pulse on epinephrine infusion EKG showing ST elevation GCS 3 seen by cardiology felt not candidate for left heart cath given severity of critical illness.  Critical care asked to admit Pertinent  Medical History  Peripheral arterial disease, status post acute on chronic limb ischemia of the left lower extremity ultimately requiring left AKA in 2021, ongoing partially occlusive clot in the external iliac vein and femoral vein. Recent right popliteal and tibial DVT initiated on DOAC Tobacco abuse Hypercholesterolemia Hypertension Degenerative joint disease, GERD, heart murmur, anemia, lupus, rheumatoid arthritis Wheelchair dependent at baseline, has a caregiver who assists with care at home  [facility] Events: Including procedures, antibiotic start and stop dates in addition to other pertinent events   8/8 admitted status post cardiac arrest.  Had been defibrillated x3.  Started on heparin drip, did receive an amiodarone in the field GCS 3 on arrival, normothermia protocol initiated. CT  angio acute segmental PE to LL lobes and RU lobe. No RV strain. Multiple rib fractures involving 2-7 bilaterally non-displaced mid sternal rx. Bilateral gg airspace disease w/ ddx being post resuscitation contusions vs aspiration> CT head late subacute to chronic appearing left inferior occipital lobe infarct no acute findings.  8/10 improved markedly, extubated yesterday, neurologically intact, up sitting in chair today  Interim History / Subjective:  Continues to improved, OOB, oriented, on [place] Creatinine plateau 2.3, 700cc UOP  Objective   Blood pressure (!) 109/53, pulse 88, temperature 98 F (36.7 C), temperature source Oral, resp. rate (!) 22, height '5\' 8"'$  (1.727 m), weight 74.1 kg, SpO2 99 %.        Intake/Output Summary (Last 24 hours) at 02/02/2022 9562 Last data filed at 02/02/2022 0700 Gross per 24 hour  Intake 1796.6 ml  Output 725 ml  Net 1071.6 ml    Filed Weights   01/31/22 1400 02/01/22 0500 02/02/22 0500  Weight: 80.7 kg 73.8 kg 74.1 kg   General:  ill-appearing, M in no acute distress HEENT: MM pink/moist, [place] in place, sclera anicteric Neuro: awake, oriented, ambulatory, conversational CV: s1s2 rrr, no m/r/g PULM:  clear bilaterally without distress on [place] GI: soft, non-tender Extremities: warm/dry, no edema  Skin: no rashes or lesions  Labs reviewed: K 3.3 Creatinine 2.3  Resolved Hospital Problem list   Post arrest shock, encephalopathy, acidemia  Assessment & Plan:   OHCA Vfib with ROSC Acute on suspected chronic RV failure Found unresponsive by caregiver and underwent 35 minutes CPR  -intubated in shock but has improved rapidly -extubated, off pressors, out of bed -trop peaked 7479, cardiology following suspect etiology is  ischemically mediated VF anticipate LHC tomorrow -continue IV heparin -ok to transfer out of ICU - Continue PTA plavix - Start aspirin  Acute hypoxemic respiratory failure  aspiration PNA on CT, small PE -continue [place] O2 to  maintain sats >92% -continue Unasyn   Recent DVT/PE  Was on Eliquis. question chronic, small, would be odd to be proximal cause of arrest, TPA held due to recent stroke -continue heparin gtt, will need DOAC resumed at discharge  Post arrest AKI Creatinine has stabilized 2.3 today, making urine, -continue to follow renal indices and follow UOP    Hypo-K/Mg Acute liver injury -continue to monitor  PVD post L AKA Wheelchair bound -PT eval  Best Practice (right click and "Reselect all SmartList Selections" daily)   Diet/type: Regular consistency (see orders) DVT prophylaxis: systemic heparin GI prophylaxis: PPI Lines: N/A Foley:  Yes, and it is still needed Code Status:  full code Last date of multidisciplinary goals of care discussion: improving, full code and scope of care  Otilio Carpen Lynnetta Tom, PA-C [facility] See Amion for pager If no response to pager , please call [phone] until 7pm After 7:00 pm call [place]  [phone]?[phone]

## 2022-02-02 NOTE — Progress Notes (Signed)
Received patient from [facility] ICU, assessment completed, VS documented, oriented patient to the room.  Will continue to monitor.

## 2022-02-02 NOTE — TOC Initial Note (Signed)
Transition of Care [facility]) - Initial/Assessment Note    Patient Details  Name: [name] [name] MRN: [id number] Date of Birth: Sep 29, 1960  Transition of Care [facility]) CM/SW Contact:    Bethena Roys, RN Phone Number: 02/02/2022, 3:08 PM  Clinical Narrative: Patient presented for cardiac arrest- Extubated 02-01-22. PTA patient was from home with [place] and her spouse. Patient has DME wheelchair in the home. Case Manager discussed disposition needs with the patient and caregiver- agreeable to home health services. They did not have a preference and [facility] is agreeable to service the patient for PT/OT. Start of care to begin within 24-48 hours post transition home. Patient will need HH PT/OT orders and F2F once stable. Plan for [facility] 02-03-22. Case Manager will continue to follow for additional needs.                Expected Discharge Plan: [place] Barriers to Discharge: Continued Medical Work up   Patient Goals and CMS Choice Patient states their goals for this hospitalization and ongoing recovery are:: to return home with caregivers.   Choice offered to / list presented to : Patient (Adult Care giver.)  Expected Discharge Plan and Services Expected Discharge Plan: [place] In-house Referral: NA Discharge Planning Services: CM Consult Post Acute Care Choice: NA Living arrangements for the past 2 months: Single Family Home                   DME Agency: NA       HH Arranged: PT, OT HH Agency: [facility] Agency Contacted: 02/02/22 Time HH Agency Contacted: 45 Representative spoke with at [place]: [place] Arrangements/Services Living arrangements for the past 2 months: [place] with:: Self, Other (Comment) (Caregiver) Patient language and need for interpreter reviewed:: Yes Do you feel safe going back to the place where you live?: Yes      Need for Family Participation in  Patient Care: Yes (Comment) Care giver support system in place?: Yes (comment) Current home services: DME (Patient has a wheelchair) Criminal Activity/Legal Involvement Pertinent to Current Situation/Hospitalization: No - Comment as needed    Permission Sought/Granted Permission sought to share information with : Family Supports, Customer service manager, Case Optician, dispensing granted to share information with : Yes, Verbal Permission Granted     Permission granted to share info w AGENCY: [facility].        Emotional Assessment Appearance:: Appears stated age     Orientation: : Oriented to Self Alcohol / Substance Use: Not Applicable Psych Involvement: No (comment)  Admission diagnosis:  Cardiac arrest ([place]) [I46.9] Acute respiratory failure with hypoxia and hypercapnia ([place]) [J96.01, J96.02] ST elevation myocardial infarction (STEMI), unspecified artery ([place]) [I21.3] Patient Active Problem List   Diagnosis Date Noted   Hx of AKA (above knee amputation), left ([place]) 01/31/2022   Cardiac arrest ([place]) 01/31/2022   Cardiogenic shock ([place]) 01/31/2022   DVT (deep venous thrombosis) ([place]) 01/31/2022   Anoxic brain injury ([place]) 01/31/2022   Acute respiratory failure ([place]) 01/31/2022   Lactic acidosis 01/31/2022   Thrombocytopenia ([place]) 01/31/2022   Hyperglycemia 01/31/2022   STEMI (ST elevation myocardial infarction) ([place]) 01/31/2022   Pulmonary emboli ([place]) 01/31/2022   Multiple fractures of ribs, left side, initial encounter for closed fracture 01/31/2022   Sternal fracture 01/31/2022   Pulmonary contusion 01/31/2022   Aspiration pneumonia ([place]) 01/31/2022   H/O: stroke 01/31/2022   Pressure injury of  skin 01/31/2022   Bite wound of left hand 09/27/2021   Arm pain 07/25/2021   Paronychia of great toe 01/28/2021   Onychomycosis 01/28/2021   Critical limb ischemia with history of revascularization of same extremity ([place]) 04/28/2020   History of COVID-19 05/10/2019    PAD (peripheral artery disease) ([place])    History of critical lower limb ischemia 02/19/2019   S/P lumbar fusion, Lower back pain 01/03/2018   Coronary artery disease involving native coronary artery of native heart with unstable angina pectoris [facility])    Preventative health care 03/13/2017   Transaminitis 12/16/2015   Bilateral shoulder pain 08/20/2012   Tobacco use disorder 08/20/2012   HTN (hypertension) 04/20/2011   HLD (hyperlipidemia) 10/08/2009   Rheumatoid arthritis ([place]) 08/11/2009   PCP:  Angelique Blonder, DO Pharmacy:   [id number] [place] [address] [id number]: [phone]: [phone]  Readmission Risk Interventions    05/13/2020    2:32 PM 05/04/2020   12:24 PM  Readmission Risk Prevention Plan  Post Dischage Appt  Complete  Medication Screening  Complete  Transportation Screening Complete Complete  PCP or Specialist Appt within 5-7 Days Complete   Home Care Screening Complete   Medication Review (RN CM) Complete

## 2022-02-02 NOTE — Progress Notes (Signed)
[place] Progress Note Patient Name: [name] [name] DOB: May 29, 1961 MRN: [id number]   Date of Service  02/02/2022  HPI/Events of Note  Multiple issues: 1. Hypokalemia - K+ = 3.3 and Creatinine = 2.38. 2. Hypocalcemia - Ca++ = 7.3 which corrects to 8.26 (Low) give albumin = 2.8.   eICU Interventions  Plan: Will replace K+ and Ca++.     Intervention Category Major Interventions: Electrolyte abnormality - evaluation and management  Becky Colan [place] 02/02/2022, 2:30 AM

## 2022-02-03 DIAGNOSIS — G931 Anoxic brain damage, not elsewhere classified: Secondary | ICD-10-CM

## 2022-02-03 DIAGNOSIS — I25110 Atherosclerotic heart disease of native coronary artery with unstable angina pectoris: Secondary | ICD-10-CM

## 2022-02-03 DIAGNOSIS — I739 Peripheral vascular disease, unspecified: Secondary | ICD-10-CM | POA: Diagnosis not present

## 2022-02-03 DIAGNOSIS — J9601 Acute respiratory failure with hypoxia: Secondary | ICD-10-CM

## 2022-02-03 DIAGNOSIS — J9602 Acute respiratory failure with hypercapnia: Secondary | ICD-10-CM

## 2022-02-03 DIAGNOSIS — I213 ST elevation (STEMI) myocardial infarction of unspecified site: Secondary | ICD-10-CM | POA: Diagnosis not present

## 2022-02-03 DIAGNOSIS — R570 Cardiogenic shock: Secondary | ICD-10-CM | POA: Diagnosis not present

## 2022-02-03 DIAGNOSIS — J690 Pneumonitis due to inhalation of food and vomit: Secondary | ICD-10-CM | POA: Diagnosis not present

## 2022-02-03 DIAGNOSIS — I469 Cardiac arrest, cause unspecified: Secondary | ICD-10-CM | POA: Diagnosis not present

## 2022-02-03 LAB — BASIC METABOLIC PANEL
Anion gap: 10 (ref 5–15)
BUN: 39 mg/dL — ABNORMAL HIGH (ref 8–23)
CO2: 22 mmol/L (ref 22–32)
Calcium: 7.5 mg/dL — ABNORMAL LOW (ref 8.9–10.3)
Chloride: 105 mmol/L (ref 98–111)
Creatinine, Ser: 2.36 mg/dL — ABNORMAL HIGH (ref 0.61–1.24)
GFR, Estimated: 31 mL/min — ABNORMAL LOW (ref >60)
Glucose, Bld: 117 mg/dL — ABNORMAL HIGH (ref 70–99)
Potassium: 4.2 mmol/L (ref 3.5–5.1)
Sodium: 137 mmol/L (ref 135–145)

## 2022-02-03 LAB — CBC
HCT: 30 % — ABNORMAL LOW (ref 39.0–52.0)
Hemoglobin: 9.5 g/dL — ABNORMAL LOW (ref 13.0–17.0)
MCH: 25.3 pg — ABNORMAL LOW (ref 26.0–34.0)
MCHC: 31.7 g/dL (ref 30.0–36.0)
MCV: 79.8 fL — ABNORMAL LOW (ref 80.0–100.0)
Platelets: 143 K/uL — ABNORMAL LOW (ref 150–400)
RBC: 3.76 MIL/uL — ABNORMAL LOW (ref 4.22–5.81)
RDW: 17.5 % — ABNORMAL HIGH (ref 11.5–15.5)
WBC: 9.9 K/uL (ref 4.0–10.5)
nRBC: 0 % (ref 0.0–0.2)

## 2022-02-03 LAB — MAGNESIUM: Magnesium: 2.1 mg/dL (ref 1.7–2.4)

## 2022-02-03 LAB — HEPARIN LEVEL (UNFRACTIONATED)
Heparin Unfractionated: 0.23 [IU]/mL — ABNORMAL LOW (ref 0.30–0.70)
Heparin Unfractionated: 0.21 IU/mL — ABNORMAL LOW (ref 0.30–0.70)
Heparin Unfractionated: 0.22 IU/mL — ABNORMAL LOW (ref 0.30–0.70)

## 2022-02-03 LAB — HEMOGLOBIN A1C
Hgb A1c MFr Bld: 5.1 % (ref 4.8–5.6)
Mean Plasma Glucose: 99.67 mg/dL

## 2022-02-03 LAB — GLUCOSE, CAPILLARY
Glucose-Capillary: 120 mg/dL — ABNORMAL HIGH (ref 70–99)
Glucose-Capillary: 131 mg/dL — ABNORMAL HIGH (ref 70–99)
Glucose-Capillary: 92 mg/dL (ref 70–99)
Glucose-Capillary: 99 mg/dL (ref 70–99)

## 2022-02-03 MED ORDER — HEPARIN BOLUS VIA INFUSION
1200.0000 [IU] | Freq: Once | INTRAVENOUS | Status: AC
  Administered 2022-02-03: 1200 [IU] via INTRAVENOUS
  Filled 2022-02-03: qty 1200

## 2022-02-03 MED ORDER — HYDROCODONE-ACETAMINOPHEN 5-325 MG PO TABS
1.0000 | ORAL_TABLET | ORAL | Status: DC | PRN
  Administered 2022-02-03 – 2022-02-08 (×27): 1 via ORAL
  Filled 2022-02-03 (×28): qty 1

## 2022-02-03 MED ORDER — AMIODARONE HCL 200 MG PO TABS: 200.0000 mg | ORAL_TABLET | Freq: Every day | ORAL | Status: DC

## 2022-02-03 MED ORDER — SODIUM CHLORIDE 0.9 % IV SOLN: INTRAVENOUS | Status: DC

## 2022-02-03 MED ORDER — AMIODARONE HCL 200 MG PO TABS
200.0000 mg | ORAL_TABLET | Freq: Two times a day (BID) | ORAL | Status: DC
  Administered 2022-02-03 – 2022-02-06 (×7): 200 mg via ORAL
  Filled 2022-02-03 (×7): qty 1

## 2022-02-03 MED ORDER — INSULIN ASPART 100 UNIT/ML IJ SOLN: 0.0000 [IU] | Freq: Three times a day (TID) | INTRAMUSCULAR | Status: DC

## 2022-02-03 MED ORDER — ATORVASTATIN CALCIUM 80 MG PO TABS
80.0000 mg | ORAL_TABLET | Freq: Every day | ORAL | Status: DC
  Administered 2022-02-04 – 2022-02-08 (×5): 80 mg via ORAL
  Filled 2022-02-03 (×5): qty 1

## 2022-02-03 MED ORDER — GUAIFENESIN-DM 100-10 MG/5ML PO SYRP
5.0000 mL | ORAL_SOLUTION | ORAL | Status: DC | PRN
  Administered 2022-02-03 – 2022-02-07 (×6): 5 mL via ORAL
  Filled 2022-02-03 (×6): qty 5

## 2022-02-03 NOTE — Progress Notes (Addendum)
ANTICOAGULATION CONSULT NOTE   Pharmacy Consult for Heparin infusion Indication:  STEMI , DVT, b/l PE  Allergies  Allergen Reactions   Dilaudid [Hydromorphone Hcl] Rash   Ramipril Rash and Other (See Comments)    Per patient on R arm and leg only; no angioedema   Tramadol Itching and Rash    Patient Measurements: Height: '5\' 8"'$  (172.7 cm) Weight: 76.8 kg (169 lb 5 oz) IBW/kg (Calculated) : 68.4 kg Heparin Dosing Weight: 76.8 kg  Vital Signs: Temp: 97.4 F (36.3 C) (08/11 1446) Temp Source: Oral (08/11 1446) BP: 118/90 (08/11 1446) Pulse Rate: 77 (08/11 1446)  Labs: Recent Labs    02/01/22 0134 02/01/22 0134 02/01/22 0414 02/01/22 1625 02/02/22 0101 02/03/22 0327 02/03/22 1126 02/03/22 1807  HGB 13.3  --  12.2*  --   --  9.5*  --   --   HCT 39.0  --  36.9*  --   --  30.0*  --   --   PLT  --   --  195  --   --  143*  --   --   APTT  --   --  33 60* 77*  --   --   --   HEPARINUNFRC  --    < > 0.81* 0.58 0.54 0.23* 0.21* 0.22*  CREATININE  --    < > 2.11* 2.33* 2.38* 2.36*  --   --    < > = values in this interval not displayed.    Estimated Creatinine Clearance: 31.8 mL/min (A) (by C-G formula based on SCr of 2.36 mg/dL (H)).  Assessment: 61 yo male admitted with STEMI, h/o VTE and chronic PE. Pharmacy consulted for heparin dosing. Patient was on Eliquis prior to admission. Heparin level today  dropped to subtherapeutic,  heparin rated was increased.   Tonight the 6 hour HL = 0.22 , subtherapeutic level on heparin 1750 units/hr.  Eliquis  taken PTA, last reported taken during past week of admit date 01/31/22.  Hgb 12.2> decreased to 9.5, pltc 195> decreased to 143k .   No  bleeding noted.   Per RN report: no issues reported to her with the heparin infusion or IV site and  no bleeding reported.  I asked RN to check IV site again and consider changing heparin to new IV site due to levels subtherapeutic x 3 despite rate increases .  >> update:  RN reports >> IV site good  and no beeping from pump.    Planning for cath lab Monday 8/14.  Goal of Therapy:  Heparin level 0.3-0.7 units/mL Monitor platelets by anticoagulation protocol: Yes   Plan:  Bolus heparin IV 1200 units. Increase heparin infusion to 1900 units/hr. Check Heparin level 6 hours.  Daily CBC, heparin level while on heparin. Monitor for signs/symptoms of bleeding.  Nicole Cella, RPh Clinical Pharmacist Please check AMION for all [place] phone numbers After 10:00 PM, call [place] [phone]  02/03/2022 7:37 PM

## 2022-02-03 NOTE — Evaluation (Signed)
Occupational Therapy Evaluation Patient Details Name: [name] [name] MRN: [id number] DOB: 1960-12-03 Today's Date: 02/03/2022   History of Present Illness 61 yo admitted 8/8 after cardiac arrest at home with 35 min CPR and 3 shocks for ROSC. Non displaced sternal fx and bil rib fx 2-7. Intubated 8/8-8/9. PMHx: PAD, Lt AKA, DVT, HLD, HTN, DJD, GERD, lupus, RA, anemia   Clinical Impression   PTA, pt lives with friends who assist with IADLs as needed. Pt typically Modified Independent with ADLs and mobility using w/c vs RW & prosthetic. Pt presents now with minor deficits in strength, endurance, balance and safety awareness. Overall, Min A for bed mobility and squat pivot with improving awareness of deficits. Pt requires Setup for UB ADLs and Min A for LB ADLs with lateral leans. Educated that pt can have friends bring prosthetic LE for further mobility work if remains admitted. Anticipate pt to return home with HHOT vs no OT needs with friend assist as needed.  VSS on RA.      Recommendations for follow up therapy are one component of a multi-disciplinary discharge planning process, led by the attending physician.  Recommendations may be updated based on patient status, additional functional criteria and insurance authorization.   Follow Up Recommendations  Home health OT    Assistance Recommended at Discharge Intermittent Supervision/Assistance  Patient can return home with the following A little help with walking and/or transfers;A little help with bathing/dressing/bathroom;Assistance with cooking/housework    Functional Status Assessment  Patient has had a recent decline in their functional status and demonstrates the ability to make significant improvements in function in a reasonable and predictable amount of time.  Equipment Recommendations  None recommended by OT    Recommendations for Other Services       Precautions / Restrictions Precautions Precautions: Fall Precaution  Comments: Lt BKA, has prosthesis but not present Restrictions Weight Bearing Restrictions: Yes Other Position/Activity Restrictions: L AKA w/o prosthetic      Mobility Bed Mobility Overal bed mobility: Needs Assistance Bed Mobility: Rolling, Sidelying to Sit Rolling: Supervision Sidelying to sit: Min assist       General bed mobility comments: assist to lift trunk, able to push through elbow and scoot hips EOB well    Transfers Overall transfer level: Needs assistance Equipment used: 1 person hand held assist Transfers: Bed to chair/wheelchair/BSC     Squat pivot transfers: Min assist       General transfer comment: initially wanted to use RW but then requested chair be pulled closer to allow squat pivot without device. light Min A to ensure hip clearance, good hand placement      Balance Overall balance assessment: Needs assistance Sitting-balance support: No upper extremity supported, Feet supported Sitting balance-Leahy Scale: Fair                                     ADL either performed or assessed with clinical judgement   ADL Overall ADL's : Needs assistance/impaired Eating/Feeding: Independent   Grooming: Set up;Sitting   Upper Body Bathing: Set up;Sitting   Lower Body Bathing: Minimal assistance;Sitting/lateral leans   Upper Body Dressing : Set up;Sitting   Lower Body Dressing: Minimal assistance;Sitting/lateral leans   Toilet Transfer: Minimal assistance;Squat-pivot   Toileting- Clothing Manipulation and Hygiene: Minimal assistance;Sitting/lateral lean         General ADL Comments: Pt fairly close to baseline, limited by decreased OOB activity  with admission and chest pain from compressions     Vision Ability to See in Adequate Light: 0 Adequate Patient Visual Report: No change from baseline Vision Assessment?: No apparent visual deficits     Perception     Praxis      Pertinent Vitals/Pain Pain Assessment Pain  Assessment: Faces Faces Pain Scale: Hurts little more Pain Location: chest (from CPR), chronic back pain Pain Descriptors / Indicators: Aching, Guarding Pain Intervention(s): Monitored during session     Hand Dominance     Extremity/Trunk Assessment Upper Extremity Assessment Upper Extremity Assessment: Overall WFL for tasks assessed   Lower Extremity Assessment Lower Extremity Assessment: Defer to PT evaluation LLE Deficits / Details: AKA   Cervical / Trunk Assessment Cervical / Trunk Assessment: Normal   Communication Communication Communication: No difficulties   Cognition Arousal/Alertness: Awake/alert Behavior During Therapy: WFL for tasks assessed/performed Overall Cognitive Status: Impaired/Different from baseline Area of Impairment: Safety/judgement                         Safety/Judgement: Decreased awareness of safety, Decreased awareness of deficits     General Comments: improving awareness and able to safely direct caregiver assist     General Comments  VSS on RA    Exercises     Shoulder Instructions      Home Living Family/patient expects to be discharged to:: Private residence Living Arrangements: Non-relatives/Friends Available Help at Discharge: Friend(s);Available 24 hours/day Type of Home: House Home Access: Ramped entrance     Home Layout: One level     Bathroom Shower/Tub: Teacher, early years/pre: Standard Bathroom Accessibility: Yes   Home Equipment: Tub bench;Grab bars - tub/shower;Rolling Walker (2 wheels);Wheelchair - manual;BSC/3in1;Hand held shower head          Prior Functioning/Environment Prior Level of Function : Needs assist             Mobility Comments: pt performs scoot pivot, stand pivot with prosthesis or walking with RW and prosthesis ADLs Comments: friends assist with cooking and cleaning, still drives. does all ADLs        OT Problem List: Decreased strength;Decreased activity  tolerance;Impaired balance (sitting and/or standing);Decreased safety awareness      OT Treatment/Interventions: Self-care/ADL training;Therapeutic exercise;Energy conservation;DME and/or AE instruction;Therapeutic activities;Patient/family education    OT Goals(Current goals can be found in the care plan section) Acute Rehab OT Goals Patient Stated Goal: eat some breakfast OT Goal Formulation: With patient Time For Goal Achievement: 02/17/22 Potential to Achieve Goals: Good  OT Frequency: Min 2X/week    Co-evaluation              AM-PAC OT "6 Clicks" Daily Activity     Outcome Measure Help from another person eating meals?: None Help from another person taking care of personal grooming?: A Little Help from another person toileting, which includes using toliet, bedpan, or urinal?: A Little Help from another person bathing (including washing, rinsing, drying)?: A Little Help from another person to put on and taking off regular upper body clothing?: A Little Help from another person to put on and taking off regular lower body clothing?: A Little 6 Click Score: 19   End of Session Equipment Utilized During Treatment: Gait belt Nurse Communication: Mobility status  Activity Tolerance: Patient tolerated treatment well Patient left: in chair;with call bell/phone within reach;with chair alarm set;with nursing/sitter in room  OT Visit Diagnosis: Other abnormalities of gait and mobility (R26.89)  Time: 0902-0926 OT Time Calculation (min): 24 min Charges:  OT General Charges $OT Visit: 1 Visit OT Evaluation $OT Eval Moderate Complexity: 1 Mod OT Treatments $Therapeutic Activity: 8-22 mins  Malachy Chamber, OTR/L Acute Rehab Services Office: [phone]   Layla Maw 02/03/2022, 9:51 AM

## 2022-02-03 NOTE — Progress Notes (Addendum)
Progress Note  Patient Name: [name] [name] Date of Encounter: 02/03/2022  [facility] Cardiologist: Candee Furbish, MD   Subjective   No complaint this morning.   Inpatient Medications    Scheduled Meds:  aspirin  81 mg Oral Daily   atorvastatin  80 mg Oral NOW   Followed by   atorvastatin  80 mg Oral Q0600   clopidogrel  75 mg Oral Daily   docusate sodium  100 mg Oral BID   DULoxetine  30 mg Oral Daily   insulin aspart  0-15 Units Subcutaneous Q4H   polyethylene glycol  17 g Oral Daily   sodium chloride flush  3 mL Intravenous Q12H   Continuous Infusions:  sodium chloride 50 mL/hr at 02/03/22 2841   sodium chloride 10 mL/hr at 02/02/22 1800   sodium chloride     sodium chloride     sodium chloride 50 mL/hr at 02/03/22 0603   amiodarone 30 mg/hr (02/03/22 0607)   ampicillin-sulbactam (UNASYN) IV 3 g (02/03/22 0326)   heparin 1,600 Units/hr (02/03/22 0656)   PRN Meds: sodium chloride, ondansetron (ZOFRAN) IV, mouth rinse, oxyCODONE, oxyCODONE-acetaminophen, sodium chloride flush   Vital Signs    Vitals:   02/03/22 0058 02/03/22 0435 02/03/22 0446 02/03/22 0736  BP:  117/79 118/78   Pulse:  84 81   Resp: (!) 22 (!) 23 20   Temp:  (!) 97.5 F (36.4 C) 98.1 F (36.7 C)   TempSrc:  Oral Oral   SpO2: 95% 93% 94% 93%  Weight:   76.8 kg   Height:        Intake/Output Summary (Last 24 hours) at 02/03/2022 0752 Last data filed at 02/03/2022 0500 Gross per 24 hour  Intake 2247.2 ml  Output 175 ml  Net 2072.2 ml      02/03/2022    4:46 AM 02/02/2022    9:24 PM 02/02/2022    2:52 PM  Last 3 Weights  Weight (lbs) 169 lb 5 oz 169 lb 5 oz 167 lb 15.9 oz  Weight (kg) 76.8 kg 76.8 kg 76.2 kg      Telemetry    Sinus Rhythm - Personally Reviewed  ECG    No new tracing  Physical Exam   GEN: Disheveled older WM Neck: No JVD Cardiac: RRR, no murmurs, rubs, or gallops.  Respiratory: Rhonchi  GI: Soft, nontender, non-distended  MS: No edema; No deformity.  Right AKA Neuro:  Nonfocal  Psych: Normal affect   Labs    High Sensitivity Troponin:   Recent Labs  Lab 01/31/22 1306 01/31/22 1843  TROPONINIHS 2,599* 7,479*     Chemistry Recent Labs  Lab 01/31/22 1306 01/31/22 1354 01/31/22 1843 02/01/22 0134 02/01/22 0414 02/01/22 1625 02/02/22 0101 02/02/22 0115 02/03/22 0327  NA 140   < > 140   < > 139 137 137  --  137  K 4.2   < > 3.9   < > 3.5 3.5 3.3*  --  4.2  CL 107  --  107  --  101 101 102  --  105  CO2 10*  --  19*  --  '22 24 23  '$ --  22  GLUCOSE 305*  --  236*  --  156* 127* 113*  --  117*  BUN 17  --  23  --  29* 32* 34*  --  39*  CREATININE 1.36*   < > 1.59*  --  2.11* 2.33* 2.38*  --  2.36*  CALCIUM  8.9  --  8.5*  --  7.7* 7.2* 7.3*  --  7.5*  MG  --   --   --    < > 1.4* 2.0  --  2.2 2.1  PROT 5.8*  --  6.9  --  6.1*  --   --   --   --   ALBUMIN 2.6*  --  3.0*  --  2.8*  --   --   --   --   AST 416*  --  598*  --  319*  --   --   --   --   ALT 406*  --  462*  --  297*  --   --   --   --   ALKPHOS 173*  --  228*  --  178*  --   --   --   --   BILITOT 0.3  --  0.5  --  0.5  --   --   --   --   GFRNONAA 59*  --  49*  --  35* 31* 30*  --  31*  ANIONGAP 23*  --  14  --  16* 12 12  --  10   < > = values in this interval not displayed.    Lipids  Recent Labs  Lab 01/31/22 1306 02/01/22 0414  CHOL 165  --   TRIG 279* 226*  HDL 19*  --   LDLCALC 90  --   CHOLHDL 8.7  --     Hematology Recent Labs  Lab 01/31/22 1306 01/31/22 1426 02/01/22 0134 02/01/22 0414 02/03/22 0327  WBC 12.9*  --   --  15.1* 9.9  RBC 5.08  --   --  4.68 3.76*  HGB 13.2   < > 13.3 12.2* 9.5*  HCT 46.3   < > 39.0 36.9* 30.0*  MCV 91.1  --   --  78.8* 79.8*  MCH 26.0  --   --  26.1 25.3*  MCHC 28.5*  --   --  33.1 31.7  RDW 17.0*  --   --  16.6* 17.5*  PLT 142*  --   --  195 143*   < > = values in this interval not displayed.   Thyroid No results for input(s): "TSH", "FREET4" in the last 168 hours.  BNPNo results for input(s):  "BNP", "PROBNP" in the last 168 hours.  DDimer No results for input(s): "DDIMER" in the last 168 hours.   Radiology    ECHOCARDIOGRAM LIMITED  Result Date: 02/01/2022    ECHOCARDIOGRAM LIMITED REPORT   Patient Name:   [name] [name] Date of Exam: 02/01/2022 Medical Rec #:  [id number]     Height:       68.0 in Accession #:    [id number]    Weight:       162.7 lb Date of Birth:  [birth year]    BSA:          1.872 m Patient Age:    61 years      BP:           110/87 mmHg Patient Gender: M             HR:           110 bpm. Exam Location:  Inpatient Procedure: Limited Echo, Cardiac Doppler, Color Doppler and Intracardiac            Opacification Agent Indications:    Cardiac arrest  History:  Patient has prior history of Echocardiogram examinations, most                 recent 01/31/2022. PAD; Risk Factors:Hypertension and Current                 Smoker.  Sonographer:    Maudry Mayhew MHA, RDMS, RVT, RDCS Referring Phys: [id number] Candee Furbish  Sonographer Comments: Image acquisition challenging due to respiratory motion. IMPRESSIONS  1. Left ventricular ejection fraction, by estimation, is 60 to 65%. The left ventricle has normal function. Left ventricular diastolic parameters are indeterminate. There is the interventricular septum is flattened in systole, consistent with right ventricular pressure overload.  2. The Right ventricle is severly dilated with hypokinesis in the basal and mid portion but hyperkinesis in the apical portion - consistent with McConnell's sign which was also present in the TTE performed 01/31/22. Right ventricular systolic function is moderately reduced. The right ventricular size is severely enlarged. There is normal pulmonary artery systolic pressure.  3. The mitral valve is grossly normal. No evidence of mitral valve regurgitation. No evidence of mitral stenosis.  4. The aortic valve is grossly normal. Aortic valve regurgitation is trivial. Aortic valve sclerosis is present, with no  evidence of aortic valve stenosis.  5. The inferior vena cava is normal in size with greater than 50% respiratory variability, suggesting right atrial pressure of 3 mmHg. FINDINGS  Left Ventricle: Left ventricular ejection fraction, by estimation, is 60 to 65%. The left ventricle has normal function. Definity contrast agent was given IV to delineate the left ventricular endocardial borders. The left ventricular internal cavity size was small. The interventricular septum is flattened in systole, consistent with right ventricular pressure overload. Left ventricular diastolic parameters are indeterminate.  LV Wall Scoring: The apical lateral segment, apical anterior segment, apical inferior segment, and apex are akinetic. The anterior wall, antero-lateral wall, mid and distal anterior septum, inferior septum, inferior wall, and mid inferolateral segment are normal. Right Ventricle: The Right ventricle is severly dilated with hypokinesis in the basal and mid portion but hyperkinesis in the apical portion - consistent with McConnell's sign which was also present in the TTE performed 01/31/22. The right ventricular size  is severely enlarged. Right ventricular systolic function is moderately reduced. There is normal pulmonary artery systolic pressure. The tricuspid regurgitant velocity is 2.43 m/s, and with an assumed right atrial pressure of 3 mmHg, the estimated right  ventricular systolic pressure is 27.0 mmHg. Left Atrium: Left atrial size was not assessed. Right Atrium: Right atrial size was not assessed. Pericardium: Presence of epicardial fat layer. Mitral Valve: The mitral valve is grossly normal. No evidence of mitral valve stenosis. Tricuspid Valve: The tricuspid valve is grossly normal. Tricuspid valve regurgitation is not demonstrated. No evidence of tricuspid stenosis. Aortic Valve: The aortic valve is grossly normal. Aortic valve regurgitation is trivial. Aortic valve sclerosis is present, with no evidence of  aortic valve stenosis. Pulmonic Valve: The pulmonic valve was not well visualized. Aorta: The aortic root is normal in size and structure and the ascending aorta was not well visualized. Venous: The inferior vena cava is normal in size with greater than 50% respiratory variability, suggesting right atrial pressure of 3 mmHg. LEFT VENTRICLE PLAX 2D LVIDd:         4.70 cm Diastology LVIDs:         3.00 cm LV e' medial:    12.20 cm/s LV PW:         1.10 cm  LV E/e' medial:  4.0 LV IVS:        0.90 cm LV e' lateral:   10.70 cm/s                        LV E/e' lateral: 4.6  RIGHT VENTRICLE RV S prime:     9.14 cm/s LEFT ATRIUM         Index LA diam:    3.00 cm 1.60 cm/m   AORTA Ao Root diam: 3.60 cm MITRAL VALVE               TRICUSPID VALVE MV Area (PHT): 3.60 cm    TR Peak grad:   23.6 mmHg MV Decel Time: 211 msec    TR Vmax:        243.00 cm/s MV E velocity: 49.30 cm/s MV A velocity: 71.30 cm/s MV E/A ratio:  0.69 Kardie Tobb DO Electronically signed by Berniece Salines DO Signature Date/Time: 02/01/2022/11:59:14 AM    Final    EEG adult  Result Date: 02/01/2022 Lora Havens, MD     02/01/2022  8:34 AM Patient Name: [name] [name] MRN: [id number] Epilepsy Attending: Lora Havens Referring Physician/Provider: Erick Colace, NP Date: 01/31/2022 Duration: 30.34 mins Patient history: 61 year old male status postcardiac arrest.  EEG to evaluate for seizure. Level of alertness: comatose AEDs during EEG study: Propofol Technical aspects: This EEG study was done with scalp electrodes positioned according to the 10-20 International system of electrode placement. Electrical activity was reviewed with band pass filter of 1-'70Hz'$ , sensitivity of 7 uV/mm, display speed of 65m/sec with a '60Hz'$  notched filter applied as appropriate. EEG data were recorded continuously and digitally stored.  Video monitoring was available and reviewed as appropriate. Description: EEG showed continuous generalized 2 to 3 Hz With overriding drop to  40 Hz beta activity.  Intermittent episodes of subtle head jerks were noted without concomitant EEG change.  Hyperventilation and photic stimulation were not performed.   ABNORMALITY - Continuous slow, generalized IMPRESSION: This study is suggestive of severe diffuse encephalopathy, nonspecific etiology.  Intermittent episodes of head jerk were noted without concomitant EEG change or most likely not epileptic.  No seizures or epileptiform discharges were seen throughout the recording. PLora Havens   Cardiac Studies   Echo: 01/31/22  IMPRESSIONS     1. Left ventricular ejection fraction, by estimation, is 60 to 65%. The  left ventricle has normal function. The left ventricle has no regional  wall motion abnormalities. Indeterminate diastolic filling due to E-A  fusion. There is the interventricular  septum is flattened in systole, consistent with right ventricular pressure  overload.   2. Right ventricular systolic function is severely reduced. The right  ventricular size is mildly enlarged. Tricuspid regurgitation signal is  inadequate for assessing PA pressure.   3. The mitral valve is normal in structure. No evidence of mitral valve  regurgitation.   4. The aortic valve was not well visualized. Aortic valve regurgitation  is not visualized. Aortic valve sclerosis is present, with no evidence of  aortic valve stenosis.   Comparison(s): Prior images reviewed side by side. The right ventricular  hypertrophy is significantly worse.   Conclusion(s)/Recommendation(s): Discussed findings with primary team.   FINDINGS   Left Ventricle: Left ventricular ejection fraction, by estimation, is 60  to 65%. The left ventricle has normal function. The left ventricle has no  regional wall motion abnormalities. The left ventricular internal cavity  size  was normal in size. There is   no left ventricular hypertrophy. The interventricular septum is flattened  in systole, consistent with right  ventricular pressure overload.  Indeterminate diastolic filling due to E-A fusion.   Right Ventricle: McConnell's sign is present, strongly correlated with  acute cor pulmonale, most likely large pulmonary embolism. The right  ventricular size is mildly enlarged. No increase in right ventricular wall  thickness. Right ventricular systolic  function is severely reduced. Tricuspid regurgitation signal is inadequate  for assessing PA pressure.   Left Atrium: Left atrial size was normal in size.   Right Atrium: Right atrial size was normal in size.   Pericardium: There is no evidence of pericardial effusion.   Mitral Valve: The mitral valve is normal in structure. No evidence of  mitral valve regurgitation.   Tricuspid Valve: The tricuspid valve is normal in structure. Tricuspid  valve regurgitation is not demonstrated.   Aortic Valve: The aortic valve was not well visualized. Aortic valve  regurgitation is not visualized. Aortic valve sclerosis is present, with  no evidence of aortic valve stenosis.   Pulmonic Valve: The pulmonic valve was not well visualized.   Aorta: The aortic root is normal in size and structure.   Venous: IVC assessment for right atrial pressure unable to be performed  due to mechanical ventilation.   IAS/Shunts: No atrial level shunt detected by color flow Doppler.   Echo: 02/01/22  IMPRESSIONS     1. Left ventricular ejection fraction, by estimation, is 60 to 65%. The  left ventricle has normal function. Left ventricular diastolic parameters  are indeterminate. There is the interventricular septum is flattened in  systole, consistent with right  ventricular pressure overload.   2. The Right ventricle is severly dilated with hypokinesis in the basal  and mid portion but hyperkinesis in the apical portion - consistent with  McConnell's sign which was also present in the TTE performed 01/31/22. Right  ventricular systolic function is  moderately reduced.  The right ventricular size is severely enlarged. There  is normal pulmonary artery systolic pressure.   3. The mitral valve is grossly normal. No evidence of mitral valve  regurgitation. No evidence of mitral stenosis.   4. The aortic valve is grossly normal. Aortic valve regurgitation is  trivial. Aortic valve sclerosis is present, with no evidence of aortic  valve stenosis.   5. The inferior vena cava is normal in size with greater than 50%  respiratory variability, suggesting right atrial pressure of 3 mmHg.   FINDINGS   Left Ventricle: Left ventricular ejection fraction, by estimation, is 60  to 65%. The left ventricle has normal function. Definity contrast agent  was given IV to delineate the left ventricular endocardial borders. The  left ventricular internal cavity  size was small. The interventricular septum is flattened in systole,  consistent with right ventricular pressure overload. Left ventricular  diastolic parameters are indeterminate.      LV Wall Scoring:  The apical lateral segment, apical anterior segment, apical inferior  segment,  and apex are akinetic. The anterior wall, antero-lateral wall, mid and  distal  anterior septum, inferior septum, inferior wall, and mid inferolateral  segment are normal.   Right Ventricle: The Right ventricle is severly dilated with hypokinesis  in the basal and mid portion but hyperkinesis in the apical portion -  consistent with McConnell's sign which was also present in the TTE  performed 01/31/22. The right ventricular size   is severely enlarged. Right ventricular  systolic function is moderately  reduced. There is normal pulmonary artery systolic pressure. The tricuspid  regurgitant velocity is 2.43 m/s, and with an assumed right atrial  pressure of 3 mmHg, the estimated right   ventricular systolic pressure is 38.1 mmHg.   Left Atrium: Left atrial size was not assessed.   Right Atrium: Right atrial size was not assessed.    Pericardium: Presence of epicardial fat layer.   Mitral Valve: The mitral valve is grossly normal. No evidence of mitral  valve stenosis.   Tricuspid Valve: The tricuspid valve is grossly normal. Tricuspid valve  regurgitation is not demonstrated. No evidence of tricuspid stenosis.   Aortic Valve: The aortic valve is grossly normal. Aortic valve  regurgitation is trivial. Aortic valve sclerosis is present, with no  evidence of aortic valve stenosis.   Pulmonic Valve: The pulmonic valve was not well visualized.   Aorta: The aortic root is normal in size and structure and the ascending  aorta was not well visualized.   Venous: The inferior vena cava is normal in size with greater than 50%  respiratory variability, suggesting right atrial pressure of 3 mmHg.   Patient Profile     61 y.o. male with PAD s/p right AKA and multiple prior interventions, nonobstructive CAD, mild to moderate AI,  and Libman-Sacks endocarditis (echo 2013), and RA who is being seen 01/31/2022 for the evaluation of cardiac arrest at the request of Dr. Regenia Skeeter.  Assessment & Plan    Cardiac arrest Ventricular fibrillation Acute hypoxic respiratory failure --Found unresponsive by bystander and had 35 minutes of CPR.  Initially intubated and required pressor support. S/p extubation 8/9.  High-sensitivity troponin peaked at 7479. --Remains on IV heparin --Ultimately plan for cardiac catheterization once renal function improves --Continue aspirin, 80 mg daily, Plavix 75 mg daily --Will stop IV amiodarone, transition to '200mg'$  BID  Recent DVT PE Severely dilated RV --Was on Eliquis PTA --Currently on IV heparin with plans for ischemic evaluation  AKI: Creatinine 1.1, peaked at 2.38 postcardiac arrest and hypoperfusion. --Currently on IV fluids --Follow BMET  PVD s/p left AKA: Wheelchair-bound at baseline  Prior CVA: Subacute occipital CVA several weeks prior to admission. --Continue Plavix and  statin  Shock Liver: in the setting of cardiac arrest -- improving   For questions or updates, please contact [place] Please consult [email or web address] for contact info under        Signed, Reino Bellis, NP  02/03/2022, 7:52 AM     Agree with note by Reino Bellis NP-C  Serum creatinine 2.38, significantly above baseline of 1.1.  Previously on Eliquis, currently on heparin.  Otherwise asymptomatic with normal LV function by 2D echo.  Will continue to follow his renal function.  On IV heparin.  Defer cath until early next week.  Transition IV to p.o. amiodarone.  Lorretta Harp, M.D., [place], [facility], [place] [address]. [place], [place]  [address]  [phone] 02/03/2022 11:13 AM

## 2022-02-03 NOTE — Progress Notes (Signed)
ANTICOAGULATION CONSULT NOTE   Pharmacy Consult for Heparin infusion Indication:  STEMI , DVT, b/l PE  Allergies  Allergen Reactions   Dilaudid [Hydromorphone Hcl] Rash   Ramipril Rash and Other (See Comments)    Per patient on R arm and leg only; no angioedema   Tramadol Itching and Rash    Patient Measurements: Height: '5\' 8"'$  (172.7 cm) Weight: 76.8 kg (169 lb 5 oz) IBW/kg (Calculated) : 68.4 kg Heparin Dosing Weight: 80 kg  Vital Signs: Temp: 98.1 F (36.7 C) (08/11 0446) Temp Source: Oral (08/11 0446) BP: 118/78 (08/11 0446) Pulse Rate: 81 (08/11 0446)  Labs: Recent Labs    01/31/22 1306 01/31/22 1340 01/31/22 1354 01/31/22 1843 01/31/22 2041 02/01/22 0134 02/01/22 0414 02/01/22 1625 02/02/22 0101 02/03/22 0327  HGB 13.2  --    < >  --   --  13.3 12.2*  --   --  9.5*  HCT 46.3  --    < >  --   --  39.0 36.9*  --   --  30.0*  PLT 142*  --   --   --   --   --  195  --   --  143*  APTT  --  147*  --   --    < >  --  33 60* 77*  --   LABPROT  --  17.6*  --   --   --   --   --   --   --   --   INR  --  1.5*  --   --   --   --   --   --   --   --   HEPARINUNFRC  --   --    < > >1.10*  --   --  0.81* 0.58 0.54 0.23*  CREATININE 1.36*  --    < > 1.59*  --   --  2.11* 2.33* 2.38* 2.36*  TROPONINIHS 2,599*  --   --  7,479*  --   --   --   --   --   --    < > = values in this interval not displayed.    Estimated Creatinine Clearance: 31.8 mL/min (A) (by C-G formula based on SCr of 2.36 mg/dL (H)).  Assessment: 61 yo male admitted with STEMI, h/o VTE and chronic PE. Pharmacy consulted for heparin dosing. Patient was on Eliquis prior to admission. Heparin level today is subtherapeutic at 0.23, on 1450 units/hr. Hgb 9.5 decreasing, plt 143. No signs/symptoms of bleeding noted. Planning for cath today.   Goal of Therapy:  Heparin level 0.3-0.7 units/mL Monitor platelets by anticoagulation protocol: Yes   Plan:  Increase heparin infusion to 1600 units/hr. Follow-up  heparin post-cath.  Daily CBC, heparin level while on heparin. Monitor for signs/symptoms of bleeding.  Jeneen Rinks, Pharm.D PGY1 Pharmacy Resident 02/03/2022 6:48 AM

## 2022-02-03 NOTE — Care Management Important Message (Signed)
Important Message  Patient Details  Name: [name] [name] MRN: [id number] Date of Birth: 11-08-1960   Medicare Important Message Given:  Yes     [name] 02/03/2022, 8:29 AM

## 2022-02-03 NOTE — Progress Notes (Signed)
ANTICOAGULATION CONSULT NOTE   Pharmacy Consult for Heparin infusion Indication:  STEMI , DVT, b/l PE  Allergies  Allergen Reactions   Dilaudid [Hydromorphone Hcl] Rash   Ramipril Rash and Other (See Comments)    Per patient on R arm and leg only; no angioedema   Tramadol Itching and Rash    Patient Measurements: Height: '5\' 8"'$  (172.7 cm) Weight: 76.8 kg (169 lb 5 oz) IBW/kg (Calculated) : 68.4 kg Heparin Dosing Weight: 80 kg  Vital Signs: Temp: 97.9 F (36.6 C) (08/11 0909) Temp Source: Oral (08/11 0909) BP: 99/81 (08/11 1144) Pulse Rate: 80 (08/11 1144)  Labs: Recent Labs    01/31/22 1306 01/31/22 1340 01/31/22 1354 01/31/22 1843 01/31/22 2041 02/01/22 0134 02/01/22 0414 02/01/22 1625 02/02/22 0101 02/03/22 0327 02/03/22 1126  HGB 13.2  --    < >  --   --  13.3 12.2*  --   --  9.5*  --   HCT 46.3  --    < >  --   --  39.0 36.9*  --   --  30.0*  --   PLT 142*  --   --   --   --   --  195  --   --  143*  --   APTT  --  147*  --   --    < >  --  33 60* 77*  --   --   LABPROT  --  17.6*  --   --   --   --   --   --   --   --   --   INR  --  1.5*  --   --   --   --   --   --   --   --   --   HEPARINUNFRC  --   --    < > >1.10*  --   --  0.81* 0.58 0.54 0.23* 0.21*  CREATININE 1.36*  --    < > 1.59*  --   --  2.11* 2.33* 2.38* 2.36*  --   TROPONINIHS 2,599*  --   --  7,479*  --   --   --   --   --   --   --    < > = values in this interval not displayed.    Estimated Creatinine Clearance: 31.8 mL/min (A) (by C-G formula based on SCr of 2.36 mg/dL (H)).  Assessment: 61 yo male admitted with STEMI, h/o VTE and chronic PE. Pharmacy consulted for heparin dosing. Patient was on Eliquis prior to admission. Heparin level today is subtherapeutic at 0.21, on 1450 units/hr. Hgb 9.5 decreasing, plt 143. No signs/symptoms of bleeding noted. Planning for cath lab Monday.  Goal of Therapy:  Heparin level 0.3-0.7 units/mL Monitor platelets by anticoagulation protocol: Yes    Plan:  Bolus heparin IV 1200 units. Increase heparin infusion to 1750 units/hr. Heparin level 6 hours.  Daily CBC, heparin level while on heparin. Monitor for signs/symptoms of bleeding.  Jeneen Rinks, Pharm.D PGY1 Pharmacy Resident 02/03/2022 12:45 PM

## 2022-02-03 NOTE — Progress Notes (Signed)
[facility] PROGRESS NOTE    Progress Note  [name] [name]  BPZ:[id number] DOB: 02-01-1961 DOA: 01/31/2022 PCP: Angelique Blonder, DO     Brief Narrative:   [name] [name] is an 61 y.o. male past medical history of peripheral artery disease acutely been ischemia status post left AKA in 2021, partially occlusive external iliac vein and femoral vein, recent right popliteal DVT on DOAC tobacco abuse, hyperlipidemia, essential hypertension who resides in a nursing home most recently seen in consultation in the emergency room for DVT and started on a DOAC EMS complaining of lower extremity pain became unresponsive and pulseless CPR was started by bystanders upon EMS arrival was found in ventricular tachycardia defibrillated x 3 and given amiodarone bolus, 35 minutes till ROSC in the ED twelve-lead EKG showed ST segment elevation, Glascow score of 3, cardiology felt not a candidate for left heart cath given severity of illness    Significant Events: 01/31/2022 s/p cardiac arrest defibrillated x 3 started on a heparin drip, amiodarone bolus given Glascow score 3. 01/31/2022 intubation  Significant studies: CT angio showed acute segmental PE no right heart strain's, multiple rib fractures 2-7 CT of the head showed left subacute on chronic left inferior occipital infarct    Assessment/Plan:   Cardiac arrest ([name]) secondary to V-fib/chronic right ventricular failure: Intubated on admission for shock improved rapidly extubated and off pressors. Tropes as high as 7500, twelve-lead EKG showed inferior ST segment elevation subtle cardiology consulted suspect etiology is ischemic etiology and anticipate left heart cath once creatinine has improved. Started on IV amiodarone and IV heparin, and has been transitioned to oral amiodarone 2D echo showed preserved EF anterior apical wall motion abnormality Currently on IV heparin. Continue Plavix and aspirin.  Acute respiratory failure with hypoxia  possibly due to aspiration pneumonia: Multifactorial possibly due to small PEs and aspiration pneumonia. Currently on IV Unasyn. Has remained afebrile leukocytosis improved.  ATN postcardiac arrest:: With a baseline creatinine of less than 1 on admission 1.5 and now stabilized at 2.3. Continue gentle IV fluid hydration, strict I's and O's and daily weights. Check a basic metabolic panel in the morning.  Recent DVT and PE/severely dilated RV: Currently on IV heparin for possible ACS. Cardiology plans for ischemic evaluation.  Metabolic encephalopathy: Likely due to cardiac arrest EEG shows severe diffuse encephalopathy nonspecific no signs of epileptic focus.  History of PVD status post left AKA: Stump is clean.  Shock liver: In the setting of cardiac arrest LFTs improving with supportive management.    Hyperglycemia: Likely stress margination was placed on insulin drip now on sliding scale insulin, continue sliding scale insulin.  Sickle decubitus ulcer stage I present on admission RN Pressure Injury Documentation: Pressure Injury 01/31/22 Buttocks Right Stage 1 -  Intact skin with non-blanchable redness of a localized area usually over a bony prominence. (Active)  01/31/22 1615  Location: Buttocks  Location Orientation: Right  Staging: Stage 1 -  Intact skin with non-blanchable redness of a localized area usually over a bony prominence.  Wound Description (Comments):   Present on Admission: Yes  Dressing Type Foam - Lift dressing to assess site every shift 02/02/22 2100      DVT prophylaxis: heparin Family Communication:none Status is: Inpatient Remains inpatient appropriate because: Cardiac arrest and V-fib    Code Status:     Code Status Orders  (From admission, onward)           Start     Ordered   02/01/22 1536  Full code  Continuous        02/01/22 1535           Code Status History     Date Active Date Inactive Code Status Order ID Comments  User Context   01/31/2022 1515 02/01/2022 1535 DNR [id number]  [name] Colace, NP ED   01/31/2022 1420 01/31/2022 1515 Full Code [id number]  [name] Colace, NP ED   05/08/2020 1338 05/14/2020 1726 Full Code [id number]  [name] Dutch, MD ED   04/28/2020 1745 05/04/2020 1802 Full Code [id number]  [name] Banner, PA-C ED   01/23/2020 1553 01/24/2020 1921 Full Code [id number]  [name] Heck, MD Inpatient   05/10/2019 1749 05/15/2019 1635 Full Code [id number]  [name] Rosenthal, MD ED   02/19/2019 1537 02/27/2019 2127 Full Code [id number]  [name] Seat, MD Inpatient   06/17/2018 0553 06/18/2018 1527 Full Code [id number]  [name] Roche, MD ED   01/03/2018 1332 01/04/2018 1910 Full Code [id number]  Mayo, Darla Lesches, PA-C Inpatient   03/15/2017 1226 03/15/2017 1849 Full Code [id number]  [name] Bush, MD Inpatient   12/08/2015 2310 12/10/2015 1852 Full Code [id number]  [name] Rude, MD Inpatient   11/18/2011 1811 11/26/2011 1416 Full Code [id number]  [name] Duke, MD Inpatient         IV Access:   Peripheral IV   Procedures and diagnostic studies:   ECHOCARDIOGRAM LIMITED  Result Date: 02/01/2022    ECHOCARDIOGRAM LIMITED REPORT   Patient Name:   [name] [name] Date of Exam: 02/01/2022 Medical Rec #:  [id number]     Height:       68.0 in Accession #:    [id number]    Weight:       162.7 lb Date of Birth:  01/05/1961    BSA:          1.872 m Patient Age:    7 years      BP:           110/87 mmHg Patient Gender: M             HR:           110 bpm. Exam Location:  Inpatient Procedure: Limited Echo, Cardiac Doppler, Color Doppler and Intracardiac            Opacification Agent Indications:    Cardiac arrest  History:        Patient has prior history of Echocardiogram examinations, most                 recent 01/31/2022. PAD; Risk Factors:Hypertension and Current                 Smoker.  Sonographer:    [name] [name] MHA, RDMS, RVT, RDCS Referring Phys: [id number] [name] [name]  Sonographer  Comments: Image acquisition challenging due to respiratory motion. IMPRESSIONS  1. Left ventricular ejection fraction, by estimation, is 60 to 65%. The left ventricle has normal function. Left ventricular diastolic parameters are indeterminate. There is the interventricular septum is flattened in systole, consistent with right ventricular pressure overload.  2. The Right ventricle is severly dilated with hypokinesis in the basal and mid portion but hyperkinesis in the apical portion - consistent with McConnell's sign which was also present in the TTE performed 01/31/22. Right ventricular systolic function is moderately reduced. The right ventricular size is severely enlarged. There is normal pulmonary artery systolic pressure.  3. The mitral valve is grossly normal. No evidence of mitral  valve regurgitation. No evidence of mitral stenosis.  4. The aortic valve is grossly normal. Aortic valve regurgitation is trivial. Aortic valve sclerosis is present, with no evidence of aortic valve stenosis.  5. The inferior vena cava is normal in size with greater than 50% respiratory variability, suggesting right atrial pressure of 3 mmHg. FINDINGS  Left Ventricle: Left ventricular ejection fraction, by estimation, is 60 to 65%. The left ventricle has normal function. Definity contrast agent was given IV to delineate the left ventricular endocardial borders. The left ventricular internal cavity size was small. The interventricular septum is flattened in systole, consistent with right ventricular pressure overload. Left ventricular diastolic parameters are indeterminate.  LV Wall Scoring: The apical lateral segment, apical anterior segment, apical inferior segment, and apex are akinetic. The anterior wall, antero-lateral wall, mid and distal anterior septum, inferior septum, inferior wall, and mid inferolateral segment are normal. Right Ventricle: The Right ventricle is severly dilated with hypokinesis in the basal and mid portion  but hyperkinesis in the apical portion - consistent with McConnell's sign which was also present in the TTE performed 01/31/22. The right ventricular size  is severely enlarged. Right ventricular systolic function is moderately reduced. There is normal pulmonary artery systolic pressure. The tricuspid regurgitant velocity is 2.43 m/s, and with an assumed right atrial pressure of 3 mmHg, the estimated right  ventricular systolic pressure is 16.1 mmHg. Left Atrium: Left atrial size was not assessed. Right Atrium: Right atrial size was not assessed. Pericardium: Presence of epicardial fat layer. Mitral Valve: The mitral valve is grossly normal. No evidence of mitral valve stenosis. Tricuspid Valve: The tricuspid valve is grossly normal. Tricuspid valve regurgitation is not demonstrated. No evidence of tricuspid stenosis. Aortic Valve: The aortic valve is grossly normal. Aortic valve regurgitation is trivial. Aortic valve sclerosis is present, with no evidence of aortic valve stenosis. Pulmonic Valve: The pulmonic valve was not well visualized. Aorta: The aortic root is normal in size and structure and the ascending aorta was not well visualized. Venous: The inferior vena cava is normal in size with greater than 50% respiratory variability, suggesting right atrial pressure of 3 mmHg. LEFT VENTRICLE PLAX 2D LVIDd:         4.70 cm Diastology LVIDs:         3.00 cm LV e' medial:    12.20 cm/s LV PW:         1.10 cm LV E/e' medial:  4.0 LV IVS:        0.90 cm LV e' lateral:   10.70 cm/s                        LV E/e' lateral: 4.6  RIGHT VENTRICLE RV S prime:     9.14 cm/s LEFT ATRIUM         Index LA diam:    3.00 cm 1.60 cm/m   AORTA Ao Root diam: 3.60 cm MITRAL VALVE               TRICUSPID VALVE MV Area (PHT): 3.60 cm    TR Peak grad:   23.6 mmHg MV Decel Time: 211 msec    TR Vmax:        243.00 cm/s MV E velocity: 49.30 cm/s MV A velocity: 71.30 cm/s MV E/A ratio:  0.69 Kardie Tobb DO Electronically signed by Berniece Salines DO Signature Date/Time: 02/01/2022/11:59:14 AM    Final      Medical Consultants:   None.  Subjective:    [name] no new complaints, complaining of chest soreness  Objective:    Vitals:   02/03/22 0058 02/03/22 0435 02/03/22 0446 02/03/22 0736  BP:  117/79 118/78   Pulse:  84 81   Resp: (!) 22 (!) 23 20   Temp:  (!) 97.5 F (36.4 C) 98.1 F (36.7 C)   TempSrc:  Oral Oral   SpO2: 95% 93% 94% 93%  Weight:   76.8 kg   Height:       SpO2: 93 % O2 Flow Rate (L/min): 2 L/min FiO2 (%): 40 %   Intake/Output Summary (Last 24 hours) at 02/03/2022 0901 Last data filed at 02/03/2022 0500 Gross per 24 hour  Intake 2206 ml  Output 175 ml  Net 2031 ml   Filed Weights   02/02/22 1452 02/02/22 2124 02/03/22 0446  Weight: 76.2 kg 76.8 kg 76.8 kg    Exam: General exam: In no acute distress. Respiratory system: Good air movement and clear to auscultation. Cardiovascular system: S1 & S2 heard, RRR. No JVD. Gastrointestinal system: Abdomen is nondistended, soft and nontender.  Extremities: No pedal edema. Skin: No rashes, lesions or ulcers Psychiatry: Judgement and insight appear normal. Mood & affect appropriate.    Data Reviewed:    Labs: Basic Metabolic Panel: Recent Labs  Lab 01/31/22 1843 02/01/22 0134 02/01/22 0414 02/01/22 1625 02/02/22 0101 02/02/22 0115 02/03/22 0327  NA 140 139 139 137 137  --  137  K 3.9 3.5 3.5 3.5 3.3*  --  4.2  CL 107  --  101 101 102  --  105  CO2 19*  --  '22 24 23  '$ --  22  GLUCOSE 236*  --  156* 127* 113*  --  117*  BUN 23  --  29* 32* 34*  --  39*  CREATININE 1.59*  --  2.11* 2.33* 2.38*  --  2.36*  CALCIUM 8.5*  --  7.7* 7.2* 7.3*  --  7.5*  MG  --   --  1.4* 2.0  --  2.2 2.1   GFR Estimated Creatinine Clearance: 31.8 mL/min (A) (by C-G formula based on SCr of 2.36 mg/dL (H)). Liver Function Tests: Recent Labs  Lab 01/31/22 1306 01/31/22 1843 02/01/22 0414  AST 416* 598* 319*  ALT 406* 462* 297*  ALKPHOS  173* 228* 178*  BILITOT 0.3 0.5 0.5  PROT 5.8* 6.9 6.1*  ALBUMIN 2.6* 3.0* 2.8*   No results for input(s): "LIPASE", "AMYLASE" in the last 168 hours. No results for input(s): "AMMONIA" in the last 168 hours. Coagulation profile Recent Labs  Lab 01/31/22 1340  INR 1.5*   COVID-19 Labs  No results for input(s): "DDIMER", "FERRITIN", "LDH", "CRP" in the last 72 hours.  Lab Results  Component Value Date   SARSCOV2NAA NEGATIVE 01/31/2022   SARSCOV2NAA POSITIVE (A) 06/26/2020   SARSCOV2NAA NEGATIVE 05/08/2020   [employer] NEGATIVE 04/28/2020    CBC: Recent Labs  Lab 01/31/22 1306 01/31/22 1426 02/01/22 0134 02/01/22 0414 02/03/22 0327  WBC 12.9*  --   --  15.1* 9.9  NEUTROABS 4.5  --   --   --   --   HGB 13.2 13.3 13.3 12.2* 9.5*  HCT 46.3 39.0 39.0 36.9* 30.0*  MCV 91.1  --   --  78.8* 79.8*  PLT 142*  --   --  195 143*   Cardiac Enzymes: No results for input(s): "CKTOTAL", "CKMB", "CKMBINDEX", "TROPONINI" in the last 168 hours. BNP (last 3  results) No results for input(s): "PROBNP" in the last 8760 hours. CBG: Recent Labs  Lab 02/02/22 0737 02/02/22 1134 02/02/22 1532 02/02/22 2003 02/02/22 2351  GLUCAP 114* 135* 89 161* 110*   D-Dimer: No results for input(s): "DDIMER" in the last 72 hours. Hgb A1c: Recent Labs    01/31/22 1306  HGBA1C 5.0   Lipid Profile: Recent Labs    01/31/22 1306 02/01/22 0414  CHOL 165  --   HDL 19*  --   LDLCALC 90  --   TRIG 279* 226*  CHOLHDL 8.7  --    Thyroid function studies: No results for input(s): "TSH", "T4TOTAL", "T3FREE", "THYROIDAB" in the last 72 hours.  Invalid input(s): "FREET3" Anemia work up: No results for input(s): "VITAMINB12", "FOLATE", "FERRITIN", "TIBC", "IRON", "RETICCTPCT" in the last 72 hours. Sepsis Labs: Recent Labs  Lab 01/31/22 1300 01/31/22 1306 02/01/22 0414 02/03/22 0327  WBC  --  12.9* 15.1* 9.9  LATICACIDVEN >9.0* >9.0*  --   --    Microbiology Recent Results (from the  past 240 hour(s))  Resp Panel by RT-PCR (Flu A&B, Covid) Anterior Nasal Swab     Status: None   Collection Time: 01/31/22 12:36 PM   Specimen: Anterior Nasal Swab  Result Value Ref Range Status   SARS Coronavirus 2 by RT PCR NEGATIVE NEGATIVE Final    Comment: (NOTE) SARS-CoV-2 target nucleic acids are NOT DETECTED.  The SARS-CoV-2 RNA is generally detectable in upper respiratory specimens during the acute phase of infection. The lowest concentration of SARS-CoV-2 viral copies this assay can detect is 138 copies/mL. A negative result does not preclude SARS-Cov-2 infection and should not be used as the sole basis for treatment or other patient management decisions. A negative result may occur with  improper specimen collection/handling, submission of specimen other than nasopharyngeal swab, presence of viral mutation(s) within the areas targeted by this assay, and inadequate number of viral copies(<138 copies/mL). A negative result must be combined with clinical observations, patient history, and epidemiological information. The expected result is Negative.  Fact Sheet for Patients:  EntrepreneurPulse.com.au  Fact Sheet for Healthcare Providers:  IncredibleEmployment.be  This test is no t yet approved or cleared by the [place] FDA and  has been authorized for detection and/or diagnosis of SARS-CoV-2 by FDA under an Emergency Use Authorization (EUA). This EUA will remain  in effect (meaning this test can be used) for the duration of the COVID-19 declaration under Section 564(b)(1) of the Act, 21 U.S.C.section 360bbb-3(b)(1), unless the authorization is terminated  or revoked sooner.       Influenza A by PCR NEGATIVE NEGATIVE Final   Influenza B by PCR NEGATIVE NEGATIVE Final    Comment: (NOTE) The Xpert Xpress SARS-CoV-2/FLU/RSV plus assay is intended as an aid in the diagnosis of influenza from Nasopharyngeal swab specimens and should  not be used as a sole basis for treatment. Nasal washings and aspirates are unacceptable for Xpert Xpress SARS-CoV-2/FLU/RSV testing.  Fact Sheet for Patients: EntrepreneurPulse.com.au  Fact Sheet for Healthcare Providers: IncredibleEmployment.be  This test is not yet approved or cleared by the [place] FDA and has been authorized for detection and/or diagnosis of SARS-CoV-2 by FDA under an Emergency Use Authorization (EUA). This EUA will remain in effect (meaning this test can be used) for the duration of the COVID-19 declaration under Section 564(b)(1) of the Act, 21 U.S.C. section 360bbb-3(b)(1), unless the authorization is terminated or revoked.  Performed at [facility], [address],  [place] [address]   Culture, blood (routine x 2)     Status: None (Preliminary result)   Collection Time: 01/31/22 12:42 PM   Specimen: BLOOD  Result Value Ref Range Status   Specimen Description BLOOD BLOOD RIGHT HAND  Final   Special Requests   Final    BOTTLES DRAWN AEROBIC AND ANAEROBIC Blood Culture results may not be optimal due to an inadequate volume of blood received in culture bottles   Culture   Final    NO GROWTH 3 DAYS Performed at [facility], [place] [address] [place], [place] [address]    Report Status PENDING  Incomplete  Culture, blood (routine x 2)     Status: None (Preliminary result)   Collection Time: 01/31/22  6:48 PM   Specimen: BLOOD RIGHT HAND  Result Value Ref Range Status   Specimen Description BLOOD RIGHT HAND  Final   Special Requests   Final    BOTTLES DRAWN AEROBIC AND ANAEROBIC Blood Culture results may not be optimal due to an inadequate volume of blood received in culture bottles   Culture   Final    NO GROWTH 3 DAYS Performed at [facility], [place] [address], [place], [place] [address]    Report Status PENDING  Incomplete     Medications:    amiodarone  200 mg Oral BID    Followed by   Derrill Memo ON 02/10/2022] amiodarone  200 mg Oral Daily   aspirin  81 mg Oral Daily   [START ON 02/04/2022] atorvastatin  80 mg Oral Daily   clopidogrel  75 mg Oral Daily   docusate sodium  100 mg Oral BID   DULoxetine  30 mg Oral Daily   insulin aspart  0-15 Units Subcutaneous Q4H   polyethylene glycol  17 g Oral Daily   sodium chloride flush  3 mL Intravenous Q12H   Continuous Infusions:  sodium chloride     sodium chloride     sodium chloride 50 mL/hr at 02/03/22 0603   ampicillin-sulbactam (UNASYN) IV 3 g (02/03/22 0326)   heparin 1,600 Units/hr (02/03/22 0656)      LOS: 3 days   Charlynne Cousins  [facility]  02/03/2022, 9:01 AM

## 2022-02-04 DIAGNOSIS — I469 Cardiac arrest, cause unspecified: Secondary | ICD-10-CM | POA: Diagnosis not present

## 2022-02-04 DIAGNOSIS — I213 ST elevation (STEMI) myocardial infarction of unspecified site: Secondary | ICD-10-CM | POA: Diagnosis not present

## 2022-02-04 DIAGNOSIS — R570 Cardiogenic shock: Secondary | ICD-10-CM | POA: Diagnosis not present

## 2022-02-04 DIAGNOSIS — I739 Peripheral vascular disease, unspecified: Secondary | ICD-10-CM | POA: Diagnosis not present

## 2022-02-04 LAB — HEPARIN LEVEL (UNFRACTIONATED)
Heparin Unfractionated: 0.29 IU/mL — ABNORMAL LOW (ref 0.30–0.70)
Heparin Unfractionated: 0.25 IU/mL — ABNORMAL LOW (ref 0.30–0.70)
Heparin Unfractionated: 0.16 IU/mL — ABNORMAL LOW (ref 0.30–0.70)

## 2022-02-04 LAB — BASIC METABOLIC PANEL
Anion gap: 12 (ref 5–15)
BUN: 44 mg/dL — ABNORMAL HIGH (ref 8–23)
CO2: 21 mmol/L — ABNORMAL LOW (ref 22–32)
Calcium: 7.5 mg/dL — ABNORMAL LOW (ref 8.9–10.3)
Chloride: 103 mmol/L (ref 98–111)
Creatinine, Ser: 2.17 mg/dL — ABNORMAL HIGH (ref 0.61–1.24)
GFR, Estimated: 34 mL/min — ABNORMAL LOW (ref >60)
Glucose, Bld: 90 mg/dL (ref 70–99)
Potassium: 4.3 mmol/L (ref 3.5–5.1)
Sodium: 136 mmol/L (ref 135–145)

## 2022-02-04 LAB — CBC
HCT: 31.3 % — ABNORMAL LOW (ref 39.0–52.0)
Hemoglobin: 10.1 g/dL — ABNORMAL LOW (ref 13.0–17.0)
MCH: 25.8 pg — ABNORMAL LOW (ref 26.0–34.0)
MCHC: 32.3 g/dL (ref 30.0–36.0)
MCV: 80.1 fL (ref 80.0–100.0)
Platelets: 196 10*3/uL (ref 150–400)
RBC: 3.91 MIL/uL — ABNORMAL LOW (ref 4.22–5.81)
RDW: 17.8 % — ABNORMAL HIGH (ref 11.5–15.5)
WBC: 9.6 10*3/uL (ref 4.0–10.5)
nRBC: 0.2 % (ref 0.0–0.2)

## 2022-02-04 LAB — GLUCOSE, CAPILLARY
Glucose-Capillary: 94 mg/dL (ref 70–99)
Glucose-Capillary: 113 mg/dL — ABNORMAL HIGH (ref 70–99)
Glucose-Capillary: 120 mg/dL — ABNORMAL HIGH (ref 70–99)
Glucose-Capillary: 99 mg/dL (ref 70–99)

## 2022-02-04 MED ORDER — HEPARIN BOLUS VIA INFUSION
1200.0000 [IU] | Freq: Once | INTRAVENOUS | Status: AC
  Administered 2022-02-04: 1200 [IU] via INTRAVENOUS
  Filled 2022-02-04: qty 1200

## 2022-02-04 MED ORDER — SODIUM CHLORIDE 0.9 % IV SOLN: INTRAVENOUS | Status: AC

## 2022-02-04 NOTE — Progress Notes (Addendum)
[facility] for Heparin  Indication: h/o VTE Brief A/P: Heparin level subtherapeutic 0.25; Increase Heparin rate  Allergies  Allergen Reactions   Dilaudid [Hydromorphone Hcl] Rash   Ramipril Rash and Other (See Comments)    Per patient on R arm and leg only; no angioedema   Tramadol Itching and Rash    Patient Measurements: Height: '5\' 8"'$  (172.7 cm) Weight: 76.8 kg (169 lb 5 oz) IBW/kg (Calculated) : 68.4 Heparin Dosing Weight: 80 kg  Vital Signs: Temp: 98.1 F (36.7 C) (08/12 0324) Temp Source: Oral (08/12 0324) BP: 123/84 (08/12 0324)  Labs: Recent Labs    02/01/22 1625 02/02/22 0101 02/03/22 0327 02/03/22 1126 02/03/22 1807 02/04/22 0036 02/04/22 0946  HGB  --   --  9.5*  --   --  10.1*  --   HCT  --   --  30.0*  --   --  31.3*  --   PLT  --   --  143*  --   --  196  --   APTT 60* 77*  --   --   --   --   --   HEPARINUNFRC 0.58 0.54 0.23*   < > 0.22* 0.29* 0.25*  CREATININE 2.33* 2.38* 2.36*  --   --  2.17*  --    < > = values in this interval not displayed.     Estimated Creatinine Clearance: 34.6 mL/min (A) (by C-G formula based on SCr of 2.17 mg/dL (H)).  Assessment: 61 yo male admitted with STEMI, h/o VTE and Eliquis on hold, for heparin. Hgb trending up, plts WNL. Talked with nursing and no signs or symptoms of bleeding or issues running the infusion.  Goal of Therapy:  Heparin level 0.3-0.7 units/mL Monitor platelets by anticoagulation protocol: Yes   Plan:  Re-bolus heparin IV 1200 Increase Heparin 2150 units/hr Repeat heparin level in 6 hours  Monitor for s/sx of bleeding Check CBC and heparin level daily  Sandford Craze, PharmD. [facility] Acute Care PGY-1 02/04/2022 10:43 AM

## 2022-02-04 NOTE — Progress Notes (Signed)
ANTICOAGULATION CONSULT NOTE  Pharmacy Consult for Heparin  Indication: h/o VTE Brief A/P: Heparin level subtherapeutic Increase Heparin rate  Allergies  Allergen Reactions   Dilaudid [Hydromorphone Hcl] Rash   Ramipril Rash and Other (See Comments)    Per patient on R arm and leg only; no angioedema   Tramadol Itching and Rash    Patient Measurements: Height: '5\' 8"'$  (172.7 cm) Weight: 76.8 kg (169 lb 5 oz) IBW/kg (Calculated) : 68.4 Heparin Dosing Weight: 80 kg  Vital Signs: Temp: 97.8 F (36.6 C) (08/11 1958) Temp Source: Oral (08/11 1958) BP: 126/83 (08/11 1958) Pulse Rate: 80 (08/11 1958)  Labs: Recent Labs    02/01/22 0414 02/01/22 1625 02/02/22 0101 02/03/22 0327 02/03/22 1126 02/03/22 1807 02/04/22 0036  HGB 12.2*  --   --  9.5*  --   --  10.1*  HCT 36.9*  --   --  30.0*  --   --  31.3*  PLT 195  --   --  143*  --   --  196  APTT 33 60* 77*  --   --   --   --   HEPARINUNFRC 0.81* 0.58 0.54 0.23* 0.21* 0.22* 0.29*  CREATININE 2.11* 2.33* 2.38* 2.36*  --   --   --      Estimated Creatinine Clearance: 31.8 mL/min (A) (by C-G formula based on SCr of 2.36 mg/dL (H)).  Assessment: 61 yo male admitted with STEMI, h/o VTE and Eliquis on hold, for heparin.  Goal of Therapy:  Heparin level 0.3-0.7 units/mL Monitor platelets by anticoagulation protocol: Yes   Plan:  Increase Heparin 2000 units/hr  Phillis Knack, PharmD, BCPS

## 2022-02-04 NOTE — Progress Notes (Signed)
Progress Note  Patient Name: [name] [name] Date of Encounter: 02/04/2022  Primary Cardiologist: Candee Furbish, MD   Subjective   No chest pain or sob.   Inpatient Medications    Scheduled Meds:  amiodarone  200 mg Oral BID   Followed by   Derrill Memo ON 02/10/2022] amiodarone  200 mg Oral Daily   aspirin  81 mg Oral Daily   atorvastatin  80 mg Oral Daily   clopidogrel  75 mg Oral Daily   docusate sodium  100 mg Oral BID   DULoxetine  30 mg Oral Daily   insulin aspart  0-15 Units Subcutaneous TID WC   polyethylene glycol  17 g Oral Daily   sodium chloride flush  3 mL Intravenous Q12H   Continuous Infusions:  sodium chloride     sodium chloride     ampicillin-sulbactam (UNASYN) IV Stopped (02/04/22 0418)   heparin 2,000 Units/hr (02/04/22 0729)   PRN Meds: sodium chloride, guaiFENesin-dextromethorphan, HYDROcodone-acetaminophen, ondansetron (ZOFRAN) IV, mouth rinse, sodium chloride flush   Vital Signs    Vitals:   02/03/22 1144 02/03/22 1446 02/03/22 1958 02/04/22 0324  BP: 99/81 (!) 118/90 126/83 123/84  Pulse: 80 77 80   Resp: '20 18  20  '$ Temp:  (!) 97.4 F (36.3 C) 97.8 F (36.6 C) 98.1 F (36.7 C)  TempSrc:  Oral Oral Oral  SpO2: 94% 95% 94% 92%  Weight:      Height:        Intake/Output Summary (Last 24 hours) at 02/04/2022 0936 Last data filed at 02/04/2022 0541 Gross per 24 hour  Intake 2595.34 ml  Output 800 ml  Net 1795.34 ml   Filed Weights   02/02/22 1452 02/02/22 2124 02/03/22 0446  Weight: 76.2 kg 76.8 kg 76.8 kg    Telemetry    Nsr at 83/min - Personally Reviewed  ECG    none - Personally Reviewed  Physical Exam   GEN: diskempt appearing,  acute distress.   Neck: 6 cm JVD Cardiac: RRR, no murmurs, rubs, or gallops.  Respiratory: Clear to auscultation bilaterally. GI: Soft, nontender, non-distended  MS: No edema; No deformity. Neuro:  Nonfocal  Psych: Normal affect   Labs    Chemistry Recent Labs  Lab 01/31/22 1306  01/31/22 1354 01/31/22 1843 02/01/22 0134 02/01/22 0414 02/01/22 1625 02/02/22 0101 02/03/22 0327 02/04/22 0036  NA 140   < > 140   < > 139   < > 137 137 136  K 4.2   < > 3.9   < > 3.5   < > 3.3* 4.2 4.3  CL 107  --  107  --  101   < > 102 105 103  CO2 10*  --  19*  --  22   < > 23 22 21*  GLUCOSE 305*  --  236*  --  156*   < > 113* 117* 90  BUN 17  --  23  --  29*   < > 34* 39* 44*  CREATININE 1.36*   < > 1.59*  --  2.11*   < > 2.38* 2.36* 2.17*  CALCIUM 8.9  --  8.5*  --  7.7*   < > 7.3* 7.5* 7.5*  PROT 5.8*  --  6.9  --  6.1*  --   --   --   --   ALBUMIN 2.6*  --  3.0*  --  2.8*  --   --   --   --  AST 416*  --  598*  --  319*  --   --   --   --   ALT 406*  --  462*  --  297*  --   --   --   --   ALKPHOS 173*  --  228*  --  178*  --   --   --   --   BILITOT 0.3  --  0.5  --  0.5  --   --   --   --   GFRNONAA 59*  --  49*  --  35*   < > 30* 31* 34*  ANIONGAP 23*  --  14  --  16*   < > '12 10 12   '$ < > = values in this interval not displayed.     Hematology Recent Labs  Lab 02/01/22 0414 02/03/22 0327 02/04/22 0036  WBC 15.1* 9.9 9.6  RBC 4.68 3.76* 3.91*  HGB 12.2* 9.5* 10.1*  HCT 36.9* 30.0* 31.3*  MCV 78.8* 79.8* 80.1  MCH 26.1 25.3* 25.8*  MCHC 33.1 31.7 32.3  RDW 16.6* 17.5* 17.8*  PLT 195 143* 196    Cardiac EnzymesNo results for input(s): "TROPONINI" in the last 168 hours. No results for input(s): "TROPIPOC" in the last 168 hours.   BNPNo results for input(s): "BNP", "PROBNP" in the last 168 hours.   DDimer No results for input(s): "DDIMER" in the last 168 hours.   Radiology    No results found.  Cardiac Studies   Echo noted  Patient Profile     61 y.o. male admitted with a VF arrest and elevated troponin with 35 minutes of CPR  Assessment & Plan    VF arrest in the setting of NSTEMI - he will undergo left heart cath once renal function is improved next week.  Inferior STEMI - he denies anginal symptoms. He will undergo left heart cath once his  renal function is improved.  PVD - he is s/p remote left AKA.  Encephalopathy - he is remarkable clear on exam despite his long down time. Acute on chronic renal failure, stage 3. His creatinine continues to improved. We will follow.       For questions or updates, please contact [place] Please consult [email or web address] for contact info under Cardiology/STEMI.      Signed, Cristopher Peru, MD  02/04/2022, 9:36 AM

## 2022-02-04 NOTE — Progress Notes (Signed)
[facility] PROGRESS NOTE    Progress Note  [name] [name]  KGM:[id number] DOB: 1960/07/22 DOA: 01/31/2022 PCP: Angelique Blonder, DO     Brief Narrative:   [name] [name] is an 61 y.o. male past medical history of peripheral artery disease acutely been ischemia status post left AKA in 2021, partially occlusive external iliac vein and femoral vein, recent right popliteal DVT on DOAC tobacco abuse, hyperlipidemia, essential hypertension who resides in a nursing home most recently seen in consultation in the emergency room for DVT and started on a DOAC EMS complaining of lower extremity pain became unresponsive and pulseless CPR was started by bystanders upon EMS arrival was found in ventricular tachycardia defibrillated x 3 and given amiodarone bolus, 35 minutes till ROSC in the ED twelve-lead EKG showed ST segment elevation, Glascow score of 3, cardiology felt not a candidate for left heart cath given severity of illness    Significant Events: 01/31/2022 s/p cardiac arrest defibrillated x 3 started on a heparin drip, amiodarone bolus given Glascow score 3. 01/31/2022 intubation  Significant studies: CT angio showed acute segmental PE no right heart strain's, multiple rib fractures 2-7 CT of the head showed left subacute on chronic left inferior occipital infarct    Assessment/Plan:   Cardiac arrest ([place]) secondary to V-fib/chronic right ventricular failure: Intubated on admission for shock improved rapidly extubated and off pressors. Cardiology consulted suspect etiology is ischemic etiology and anticipate left heart cath once creatinine has improved. Now on oral amiodarone and IV heparin. 2D echo showed preserved EF anterior apical wall motion abnormality Continue Plavix and aspirin.  Acute respiratory failure with hypoxia possibly due to aspiration pneumonia: Multifactorial possibly due to small PEs and aspiration pneumonia. Currently on IV Unasyn will complete a 5-day course. Has  remained afebrile leukocytosis improved.  ATN postcardiac arrest:: With a baseline creatinine of less than 1 on admission 1.5 and now stabilized at 2.3. Continue gentle hydration, follow strict I's and O's Daily weights. Recheck basic metabolic panel in the morning.  Recent DVT and PE/severely dilated RV: Currently on IV heparin for possible ACS. Cardiology plans for ischemic evaluation.  Metabolic encephalopathy: Likely due to cardiac arrest EEG shows severe diffuse encephalopathy nonspecific no signs of epileptic focus.  History of PVD status post left AKA: Stump is clean.  Shock liver: In the setting of cardiac arrest LFTs improving with supportive management.    Hyperglycemia: Likely stress margination was placed on insulin drip now on sliding scale insulin, continue sliding scale insulin.  Sickle decubitus ulcer stage I present on admission RN Pressure Injury Documentation: Pressure Injury 01/31/22 Buttocks Right Stage 1 -  Intact skin with non-blanchable redness of a localized area usually over a bony prominence. (Active)  01/31/22 1615  Location: Buttocks  Location Orientation: Right  Staging: Stage 1 -  Intact skin with non-blanchable redness of a localized area usually over a bony prominence.  Wound Description (Comments):   Present on Admission: Yes  Dressing Type Foam - Lift dressing to assess site every shift 02/02/22 2100      DVT prophylaxis: heparin Family Communication:none Status is: Inpatient Remains inpatient appropriate because: Cardiac arrest and V-fib    Code Status:     Code Status Orders  (From admission, onward)           Start     Ordered   02/01/22 1536  Full code  Continuous        02/01/22 1535  Code Status History     Date Active Date Inactive Code Status Order ID Comments User Context   01/31/2022 1515 02/01/2022 1535 DNR [id number]  [name] Colace, NP ED   01/31/2022 1420 01/31/2022 1515 Full Code [id number]  [name] Colace, NP ED   05/08/2020 1338 05/14/2020 1726 Full Code [id number]  Elam Dutch, MD ED   04/28/2020 1745 05/04/2020 1802 Full Code [id number]  Barbie Banner, PA-C ED   01/23/2020 1553 01/24/2020 1921 Full Code [id number]  Marty Heck, MD Inpatient   05/10/2019 1749 05/15/2019 1635 Full Code [id number]  Jean Rosenthal, MD ED   02/19/2019 1537 02/27/2019 2127 Full Code [id number]  Neva Seat, MD Inpatient   06/17/2018 0553 06/18/2018 1527 Full Code [id number]  Welford Roche, MD ED   01/03/2018 1332 01/04/2018 1910 Full Code [id number]  Mayo, Darla Lesches, PA-C Inpatient   03/15/2017 1226 03/15/2017 1849 Full Code [id number]  Nelva Bush, MD Inpatient   12/08/2015 2310 12/10/2015 1852 Full Code [id number]  Juliet Rude, MD Inpatient   11/18/2011 1811 11/26/2011 1416 Full Code [id number]  Raisch, Rebeca Alert, MD Inpatient         IV Access:   Peripheral IV   Procedures and diagnostic studies:   No results found.   Medical Consultants:   None.   Subjective:    [name] [name] relates his chest soreness is improved.  Objective:    Vitals:   02/03/22 1144 02/03/22 1446 02/03/22 1958 02/04/22 0324  BP: 99/81 (!) 118/90 126/83 123/84  Pulse: 80 77 80   Resp: '20 18  20  '$ Temp:  (!) 97.4 F (36.3 C) 97.8 F (36.6 C) 98.1 F (36.7 C)  TempSrc:  Oral Oral Oral  SpO2: 94% 95% 94% 92%  Weight:      Height:       SpO2: 92 % O2 Flow Rate (L/min): 2 L/min FiO2 (%): 40 %   Intake/Output Summary (Last 24 hours) at 02/04/2022 0842 Last data filed at 02/04/2022 0541 Gross per 24 hour  Intake 2595.34 ml  Output 800 ml  Net 1795.34 ml    Filed Weights   02/02/22 1452 02/02/22 2124 02/03/22 0446  Weight: 76.2 kg 76.8 kg 76.8 kg    Exam: General exam: In no acute distress. Respiratory system: Good air movement and clear to auscultation. Cardiovascular system: S1 & S2 heard, RRR. No JVD. Gastrointestinal system: Abdomen is nondistended, soft and  nontender.  Extremities: No pedal edema. Skin: No rashes, lesions or ulcers Psychiatry: Judgement and insight appear normal. Mood & affect appropriate. Data Reviewed:    Labs: Basic Metabolic Panel: Recent Labs  Lab 02/01/22 0414 02/01/22 1625 02/02/22 0101 02/02/22 0115 02/03/22 0327 02/04/22 0036  NA 139 137 137  --  137 136  K 3.5 3.5 3.3*  --  4.2 4.3  CL 101 101 102  --  105 103  CO2 '22 24 23  '$ --  22 21*  GLUCOSE 156* 127* 113*  --  117* 90  BUN 29* 32* 34*  --  39* 44*  CREATININE 2.11* 2.33* 2.38*  --  2.36* 2.17*  CALCIUM 7.7* 7.2* 7.3*  --  7.5* 7.5*  MG 1.4* 2.0  --  2.2 2.1  --     GFR Estimated Creatinine Clearance: 34.6 mL/min (A) (by C-G formula based on SCr of 2.17 mg/dL (H)). Liver Function Tests: Recent Labs  Lab 01/31/22 1306 01/31/22 1843 02/01/22 0414  AST 416* 598* 319*  ALT  406* 462* 297*  ALKPHOS 173* 228* 178*  BILITOT 0.3 0.5 0.5  PROT 5.8* 6.9 6.1*  ALBUMIN 2.6* 3.0* 2.8*    No results for input(s): "LIPASE", "AMYLASE" in the last 168 hours. No results for input(s): "AMMONIA" in the last 168 hours. Coagulation profile Recent Labs  Lab 01/31/22 1340  INR 1.5*    COVID-19 Labs  No results for input(s): "DDIMER", "FERRITIN", "LDH", "CRP" in the last 72 hours.  Lab Results  Component Value Date   SARSCOV2NAA NEGATIVE 01/31/2022   SARSCOV2NAA POSITIVE (A) 06/26/2020   SARSCOV2NAA NEGATIVE 05/08/2020   [place] NEGATIVE 04/28/2020    CBC: Recent Labs  Lab 01/31/22 1306 01/31/22 1426 02/01/22 0134 02/01/22 0414 02/03/22 0327 02/04/22 0036  WBC 12.9*  --   --  15.1* 9.9 9.6  NEUTROABS 4.5  --   --   --   --   --   HGB 13.2 13.3 13.3 12.2* 9.5* 10.1*  HCT 46.3 39.0 39.0 36.9* 30.0* 31.3*  MCV 91.1  --   --  78.8* 79.8* 80.1  PLT 142*  --   --  195 143* 196    Cardiac Enzymes: No results for input(s): "CKTOTAL", "CKMB", "CKMBINDEX", "TROPONINI" in the last 168 hours. BNP (last 3 results) No results for input(s):  "PROBNP" in the last 8760 hours. CBG: Recent Labs  Lab 02/03/22 0859 02/03/22 1141 02/03/22 1719 02/03/22 2143 02/04/22 0814  GLUCAP 120* 131* 92 99 94    D-Dimer: No results for input(s): "DDIMER" in the last 72 hours. Hgb A1c: Recent Labs    02/03/22 0327  HGBA1C 5.1    Lipid Profile: No results for input(s): "CHOL", "HDL", "LDLCALC", "TRIG", "CHOLHDL", "LDLDIRECT" in the last 72 hours.  Thyroid function studies: No results for input(s): "TSH", "T4TOTAL", "T3FREE", "THYROIDAB" in the last 72 hours.  Invalid input(s): "FREET3" Anemia work up: No results for input(s): "VITAMINB12", "FOLATE", "FERRITIN", "TIBC", "IRON", "RETICCTPCT" in the last 72 hours. Sepsis Labs: Recent Labs  Lab 01/31/22 1300 01/31/22 1306 02/01/22 0414 02/03/22 0327 02/04/22 0036  WBC  --  12.9* 15.1* 9.9 9.6  LATICACIDVEN >9.0* >9.0*  --   --   --     Microbiology Recent Results (from the past 240 hour(s))  Resp Panel by RT-PCR (Flu A&B, Covid) Anterior Nasal Swab     Status: None   Collection Time: 01/31/22 12:36 PM   Specimen: Anterior Nasal Swab  Result Value Ref Range Status   SARS Coronavirus 2 by RT PCR NEGATIVE NEGATIVE Final    Comment: (NOTE) SARS-CoV-2 target nucleic acids are NOT DETECTED.  The SARS-CoV-2 RNA is generally detectable in upper respiratory specimens during the acute phase of infection. The lowest concentration of SARS-CoV-2 viral copies this assay can detect is 138 copies/mL. A negative result does not preclude SARS-Cov-2 infection and should not be used as the sole basis for treatment or other patient management decisions. A negative result may occur with  improper specimen collection/handling, submission of specimen other than nasopharyngeal swab, presence of viral mutation(s) within the areas targeted by this assay, and inadequate number of viral copies(<138 copies/mL). A negative result must be combined with clinical observations, patient history, and  epidemiological information. The expected result is Negative.  Fact Sheet for Patients:  EntrepreneurPulse.com.au  Fact Sheet for Healthcare Providers:  IncredibleEmployment.be  This test is no t yet approved or cleared by the [place] FDA and  has been authorized for detection and/or diagnosis of SARS-CoV-2 by FDA under an Emergency Use  Authorization (EUA). This EUA will remain  in effect (meaning this test can be used) for the duration of the COVID-19 declaration under Section 564(b)(1) of the Act, 21 U.S.C.section 360bbb-3(b)(1), unless the authorization is terminated  or revoked sooner.       Influenza A by PCR NEGATIVE NEGATIVE Final   Influenza B by PCR NEGATIVE NEGATIVE Final    Comment: (NOTE) The Xpert Xpress SARS-CoV-2/FLU/RSV plus assay is intended as an aid in the diagnosis of influenza from Nasopharyngeal swab specimens and should not be used as a sole basis for treatment. Nasal washings and aspirates are unacceptable for Xpert Xpress SARS-CoV-2/FLU/RSV testing.  Fact Sheet for Patients: EntrepreneurPulse.com.au  Fact Sheet for Healthcare Providers: IncredibleEmployment.be  This test is not yet approved or cleared by the [place] FDA and has been authorized for detection and/or diagnosis of SARS-CoV-2 by FDA under an Emergency Use Authorization (EUA). This EUA will remain in effect (meaning this test can be used) for the duration of the COVID-19 declaration under Section 564(b)(1) of the Act, 21 U.S.C. section 360bbb-3(b)(1), unless the authorization is terminated or revoked.  Performed at [facility], [place] [address], [place], [place] [address]   Culture, blood (routine x 2)     Status: None (Preliminary result)   Collection Time: 01/31/22 12:42 PM   Specimen: BLOOD  Result Value Ref Range Status   Specimen Description BLOOD BLOOD RIGHT HAND  Final   Special Requests    Final    BOTTLES DRAWN AEROBIC AND ANAEROBIC Blood Culture results may not be optimal due to an inadequate volume of blood received in culture bottles   Culture   Final    NO GROWTH 4 DAYS Performed at [facility], [place] [address] [place], [place] [address]    Report Status PENDING  Incomplete  Culture, blood (routine x 2)     Status: None (Preliminary result)   Collection Time: 01/31/22  6:48 PM   Specimen: BLOOD RIGHT HAND  Result Value Ref Range Status   Specimen Description BLOOD RIGHT HAND  Final   Special Requests   Final    BOTTLES DRAWN AEROBIC AND ANAEROBIC Blood Culture results may not be optimal due to an inadequate volume of blood received in culture bottles   Culture   Final    NO GROWTH 4 DAYS Performed at [facility], [place] [address] [place], [place] [address]    Report Status PENDING  Incomplete     Medications:    amiodarone  200 mg Oral BID   Followed by   Derrill Memo ON 02/10/2022] amiodarone  200 mg Oral Daily   aspirin  81 mg Oral Daily   atorvastatin  80 mg Oral Daily   clopidogrel  75 mg Oral Daily   docusate sodium  100 mg Oral BID   DULoxetine  30 mg Oral Daily   insulin aspart  0-15 Units Subcutaneous TID WC   polyethylene glycol  17 g Oral Daily   sodium chloride flush  3 mL Intravenous Q12H   Continuous Infusions:  sodium chloride     ampicillin-sulbactam (UNASYN) IV Stopped (02/04/22 0418)   heparin 2,000 Units/hr (02/04/22 0729)      LOS: 4 days   Charlynne Cousins  [facility]  02/04/2022, 8:42 AM

## 2022-02-04 NOTE — Progress Notes (Signed)
ANTICOAGULATION CONSULT NOTE  Pharmacy Consult for Heparin  Indication: h/o VTE  Allergies  Allergen Reactions   Dilaudid [Hydromorphone Hcl] Rash   Ramipril Rash and Other (See Comments)    Per patient on R arm and leg only; no angioedema   Tramadol Itching and Rash    Patient Measurements: Height: '5\' 8"'$  (172.7 cm) Weight: 76.8 kg (169 lb 5 oz) IBW/kg (Calculated) : 68.4 Heparin Dosing Weight: 80 kg  Vital Signs: Temp: 97.6 F (36.4 C) (08/12 1514) Temp Source: Oral (08/12 1514) BP: 127/86 (08/12 1514) Pulse Rate: 85 (08/12 1514)  Labs: Recent Labs    02/02/22 0101 02/03/22 0327 02/03/22 1126 02/04/22 0036 02/04/22 0946 02/04/22 1751  HGB  --  9.5*  --  10.1*  --   --   HCT  --  30.0*  --  31.3*  --   --   PLT  --  143*  --  196  --   --   APTT 77*  --   --   --   --   --   HEPARINUNFRC 0.54 0.23*   < > 0.29* 0.25* 0.16*  CREATININE 2.38* 2.36*  --  2.17*  --   --    < > = values in this interval not displayed.     Estimated Creatinine Clearance: 34.6 mL/min (A) (by C-G formula based on SCr of 2.17 mg/dL (H)).  Assessment: 61 yo male admitted with STEMI, h/o VTE and Eliquis on hold, for heparin. Hgb trending up, plts WNL.  Heparin drip rate 2150 uts/hr heparin level subtherapeutic 0.16 despite rate increase earlier today   Goal of Therapy:  Heparin level 0.3-0.7 units/mL Monitor platelets by anticoagulation protocol: Yes   Plan:  Increase Heparin 2250 units/hr Monitor for s/sx of bleeding Check CBC and heparin level daily    Bonnita Nasuti Pharm.D. CPP, BCPS Clinical Pharmacist [phone] 02/04/2022 7:25 PM

## 2022-02-05 ENCOUNTER — Encounter (HOSPITAL_COMMUNITY): Payer: Self-pay | Admitting: Pulmonary Disease

## 2022-02-05 DIAGNOSIS — I739 Peripheral vascular disease, unspecified: Secondary | ICD-10-CM | POA: Diagnosis not present

## 2022-02-05 DIAGNOSIS — I213 ST elevation (STEMI) myocardial infarction of unspecified site: Secondary | ICD-10-CM | POA: Diagnosis not present

## 2022-02-05 DIAGNOSIS — R570 Cardiogenic shock: Secondary | ICD-10-CM | POA: Diagnosis not present

## 2022-02-05 DIAGNOSIS — I469 Cardiac arrest, cause unspecified: Secondary | ICD-10-CM | POA: Diagnosis not present

## 2022-02-05 LAB — BASIC METABOLIC PANEL
Anion gap: 12 (ref 5–15)
BUN: 37 mg/dL — ABNORMAL HIGH (ref 8–23)
CO2: 21 mmol/L — ABNORMAL LOW (ref 22–32)
Calcium: 7.8 mg/dL — ABNORMAL LOW (ref 8.9–10.3)
Chloride: 104 mmol/L (ref 98–111)
Creatinine, Ser: 1.86 mg/dL — ABNORMAL HIGH (ref 0.61–1.24)
GFR, Estimated: 41 mL/min — ABNORMAL LOW (ref >60)
Glucose, Bld: 91 mg/dL (ref 70–99)
Potassium: 3.8 mmol/L (ref 3.5–5.1)
Sodium: 137 mmol/L (ref 135–145)

## 2022-02-05 LAB — CBC
HCT: 31.3 % — ABNORMAL LOW (ref 39.0–52.0)
Hemoglobin: 10 g/dL — ABNORMAL LOW (ref 13.0–17.0)
MCH: 25.8 pg — ABNORMAL LOW (ref 26.0–34.0)
MCHC: 31.9 g/dL (ref 30.0–36.0)
MCV: 80.7 fL (ref 80.0–100.0)
Platelets: 219 10*3/uL (ref 150–400)
RBC: 3.88 MIL/uL — ABNORMAL LOW (ref 4.22–5.81)
RDW: 17.7 % — ABNORMAL HIGH (ref 11.5–15.5)
WBC: 11.2 10*3/uL — ABNORMAL HIGH (ref 4.0–10.5)
nRBC: 0.2 % (ref 0.0–0.2)

## 2022-02-05 LAB — CULTURE, BLOOD (ROUTINE X 2)
Culture: NO GROWTH
Culture: NO GROWTH

## 2022-02-05 LAB — HEPARIN LEVEL (UNFRACTIONATED)
Heparin Unfractionated: 0.31 IU/mL (ref 0.30–0.70)
Heparin Unfractionated: 0.33 IU/mL (ref 0.30–0.70)

## 2022-02-05 LAB — GLUCOSE, CAPILLARY
Glucose-Capillary: 82 mg/dL (ref 70–99)
Glucose-Capillary: 82 mg/dL (ref 70–99)
Glucose-Capillary: 87 mg/dL (ref 70–99)
Glucose-Capillary: 173 mg/dL — ABNORMAL HIGH (ref 70–99)

## 2022-02-05 MED ORDER — SODIUM CHLORIDE 0.9 % WEIGHT BASED INFUSION: 1.0000 mL/kg/h | INTRAVENOUS | Status: DC

## 2022-02-05 MED ORDER — SODIUM CHLORIDE 0.9 % IV SOLN: INTRAVENOUS | Status: AC

## 2022-02-05 MED ORDER — ASPIRIN 81 MG PO CHEW
81.0000 mg | CHEWABLE_TABLET | ORAL | Status: AC
  Administered 2022-02-06: 81 mg via ORAL

## 2022-02-05 MED ORDER — SODIUM CHLORIDE 0.9% FLUSH: 3.0000 mL | INTRAVENOUS | Status: DC | PRN

## 2022-02-05 MED ORDER — SODIUM CHLORIDE 0.9 % IV SOLN: 250.0000 mL | INTRAVENOUS | Status: DC | PRN

## 2022-02-05 MED ORDER — SODIUM CHLORIDE 0.9 % WEIGHT BASED INFUSION
3.0000 mL/kg/h | INTRAVENOUS | Status: DC
  Administered 2022-02-06: 3 mL/kg/h via INTRAVENOUS

## 2022-02-05 MED ORDER — SODIUM CHLORIDE 0.9% FLUSH
3.0000 mL | Freq: Two times a day (BID) | INTRAVENOUS | Status: DC
  Administered 2022-02-07: 3 mL via INTRAVENOUS

## 2022-02-05 MED ORDER — PNEUMOCOCCAL 20-VAL CONJ VACC 0.5 ML IM SUSY
0.5000 mL | PREFILLED_SYRINGE | INTRAMUSCULAR | Status: AC
  Administered 2022-02-06: 0.5 mL via INTRAMUSCULAR
  Filled 2022-02-05: qty 0.5

## 2022-02-05 NOTE — Progress Notes (Signed)
Progress Note  Patient Name: [name] [name] Date of Encounter: 02/05/2022  Primary Cardiologist: Candee Furbish, MD   Subjective   Denies chest pain or sob.   Inpatient Medications    Scheduled Meds:  amiodarone  200 mg Oral BID   Followed by   Derrill Memo ON 02/10/2022] amiodarone  200 mg Oral Daily   aspirin  81 mg Oral Daily   atorvastatin  80 mg Oral Daily   clopidogrel  75 mg Oral Daily   docusate sodium  100 mg Oral BID   DULoxetine  30 mg Oral Daily   insulin aspart  0-15 Units Subcutaneous TID WC   polyethylene glycol  17 g Oral Daily   Continuous Infusions:  sodium chloride     ampicillin-sulbactam (UNASYN) IV 3 g (02/05/22 0838)   heparin 2,250 Units/hr (02/05/22 0025)   PRN Meds: guaiFENesin-dextromethorphan, HYDROcodone-acetaminophen, ondansetron (ZOFRAN) IV, mouth rinse   Vital Signs    Vitals:   02/04/22 1100 02/04/22 1514 02/04/22 2010 02/05/22 0347  BP: 112/74 127/86 (!) 134/93 125/87  Pulse:  85 88 81  Resp: '20 20 20   '$ Temp:  97.6 F (36.4 C) 98.1 F (36.7 C) 98.5 F (36.9 C)  TempSrc:  Oral Oral Oral  SpO2:  90%    Weight:      Height:        Intake/Output Summary (Last 24 hours) at 02/05/2022 0958 Last data filed at 02/05/2022 0025 Gross per 24 hour  Intake 825.41 ml  Output 301 ml  Net 524.41 ml   Filed Weights   02/02/22 1452 02/02/22 2124 02/03/22 0446  Weight: 76.2 kg 76.8 kg 76.8 kg    Telemetry    nsr - Personally Reviewed  ECG    none - Personally Reviewed  Physical Exam   GEN: well appearing but diskempt, no acute distress.   Neck: 6 cm JVD Cardiac: RRR, no murmurs, rubs, or gallops.  Respiratory: Clear to auscultation bilaterally. GI: Soft, nontender, non-distended  MS: No edema; No deformity. Neuro:  Nonfocal  Psych: Normal affect   Labs    Chemistry Recent Labs  Lab 01/31/22 1306 01/31/22 1354 01/31/22 1843 02/01/22 0134 02/01/22 0414 02/01/22 1625 02/03/22 0327 02/04/22 0036 02/05/22 0208  NA 140   <  > 140   < > 139   < > 137 136 137  K 4.2   < > 3.9   < > 3.5   < > 4.2 4.3 3.8  CL 107  --  107  --  101   < > 105 103 104  CO2 10*  --  19*  --  22   < > 22 21* 21*  GLUCOSE 305*  --  236*  --  156*   < > 117* 90 91  BUN 17  --  23  --  29*   < > 39* 44* 37*  CREATININE 1.36*   < > 1.59*  --  2.11*   < > 2.36* 2.17* 1.86*  CALCIUM 8.9  --  8.5*  --  7.7*   < > 7.5* 7.5* 7.8*  PROT 5.8*  --  6.9  --  6.1*  --   --   --   --   ALBUMIN 2.6*  --  3.0*  --  2.8*  --   --   --   --   AST 416*  --  598*  --  319*  --   --   --   --  ALT 406*  --  462*  --  297*  --   --   --   --   ALKPHOS 173*  --  228*  --  178*  --   --   --   --   BILITOT 0.3  --  0.5  --  0.5  --   --   --   --   GFRNONAA 59*  --  49*  --  35*   < > 31* 34* 41*  ANIONGAP 23*  --  14  --  16*   < > '10 12 12   '$ < > = values in this interval not displayed.     Hematology Recent Labs  Lab 02/03/22 0327 02/04/22 0036 02/05/22 0208  WBC 9.9 9.6 11.2*  RBC 3.76* 3.91* 3.88*  HGB 9.5* 10.1* 10.0*  HCT 30.0* 31.3* 31.3*  MCV 79.8* 80.1 80.7  MCH 25.3* 25.8* 25.8*  MCHC 31.7 32.3 31.9  RDW 17.5* 17.8* 17.7*  PLT 143* 196 219    Cardiac EnzymesNo results for input(s): "TROPONINI" in the last 168 hours. No results for input(s): "TROPIPOC" in the last 168 hours.   BNPNo results for input(s): "BNP", "PROBNP" in the last 168 hours.   DDimer No results for input(s): "DDIMER" in the last 168 hours.   Radiology    No results found.  Cardiac Studies   See above  Patient Profile     61 y.o. male admitted with a VF arrest, prolonged down time and STEMI. He has had a nice recovery.  Assessment & Plan    VF arrest - he has had no arrhythmias on amiodarone. I would probably not continue this medication at discharged. Inferior STEMI - he denies angina. He will undergo left heart cath. As his creatinine has improved, I will put him on the add on schedule for tomorrow and make him NPO after breakfast.  Encephalopathy -  he has had a remarkable recovery with no obvious residual.      For questions or updates, please contact [facility] Please consult [email or web address] for contact info under Cardiology/STEMI.      Signed, Cristopher Peru, MD  02/05/2022, 9:58 AM

## 2022-02-05 NOTE — Progress Notes (Signed)
[facility] PROGRESS NOTE    Progress Note  [name] [name]  FUX:[id number] DOB: [birth year] DOA: 01/31/2022 PCP: Angelique Blonder, DO     Brief Narrative:   [name] [name] is an 61 y.o. male past medical history of peripheral artery disease acutely been ischemia status post left AKA in 2021, partially occlusive external iliac vein and femoral vein, recent right popliteal DVT on DOAC tobacco abuse, hyperlipidemia, essential hypertension who resides in a nursing home most recently seen in consultation in the emergency room for DVT and started on a DOAC EMS complaining of lower extremity pain became unresponsive and pulseless CPR was started by bystanders upon EMS arrival was found in ventricular tachycardia defibrillated x 3 and given amiodarone bolus, 35 minutes till ROSC in the ED twelve-lead EKG showed ST segment elevation, Glascow score of 3, cardiology felt not a candidate for left heart cath given severity of illness  Significant Events: 01/31/2022 s/p cardiac arrest defibrillated x 3 started on a heparin drip, amiodarone bolus given Glascow score 3. 01/31/2022 intubation  Significant studies: CT angio showed acute segmental PE no right heart strain's, multiple rib fractures 2-7 CT of the head showed left subacute on chronic left inferior occipital infarct    Assessment/Plan:   Cardiac arrest ([place]) secondary to V-fib/chronic right ventricular failure: Intubated on admission for shock improved rapidly extubated and off pressors. Cardiology consulted suspect etiology is ischemic etiology and anticipate left heart cath once creatinine has improved. Now on oral amiodarone and IV heparin. 2D echo showed preserved EF anterior apical wall motion abnormality Continue Plavix and aspirin.  Acute respiratory failure with hypoxia possibly due to aspiration pneumonia: Multifactorial possibly due to small PEs and aspiration pneumonia. Currently on IV Unasyn will complete a 5-day course. Has  remained afebrile leukocytosis improved.  ATN postcardiac arrest:: With a baseline creatinine of less than 1 on admission 1.5. Continue strict I's and O's and daily weights KVO IV fluids.  His creatinine today is 1.8. Continue strict I's and O's and daily weights I's and O's reported recorded. Recheck basic metabolic panel tomorrow morning  Recent DVT and PE/severely dilated RV: Currently on IV heparin for possible ACS. Cardiology plans for ischemic evaluation.  Metabolic encephalopathy: Likely due to cardiac arrest EEG shows severe diffuse encephalopathy nonspecific no signs of epileptic focus.  History of PVD status post left AKA: Stump is clean.  Shock liver: In the setting of cardiac arrest LFTs improving with supportive management.    Hyperglycemia: Likely stress margination was placed on insulin drip now on sliding scale insulin, continue sliding scale insulin. A1c of 5.0 does not require insulin in the last 24 hours.  Sickle decubitus ulcer stage I present on admission RN Pressure Injury Documentation: Pressure Injury 01/31/22 Buttocks Right Stage 1 -  Intact skin with non-blanchable redness of a localized area usually over a bony prominence. (Active)  01/31/22 1615  Location: Buttocks  Location Orientation: Right  Staging: Stage 1 -  Intact skin with non-blanchable redness of a localized area usually over a bony prominence.  Wound Description (Comments):   Present on Admission: Yes  Dressing Type Foam - Lift dressing to assess site every shift 02/02/22 2100      DVT prophylaxis: heparin Family Communication:none Status is: Inpatient Remains inpatient appropriate because: Cardiac arrest and V-fib    Code Status:     Code Status Orders  (From admission, onward)           Start     Ordered  02/01/22 1536  Full code  Continuous        02/01/22 1535           Code Status History     Date Active Date Inactive Code Status Order ID Comments User  Context   01/31/2022 1515 02/01/2022 1535 DNR [id number]  Erick Colace, NP ED   01/31/2022 1420 01/31/2022 1515 Full Code [id number]  Erick Colace, NP ED   05/08/2020 1338 05/14/2020 1726 Full Code [id number]  Elam Dutch, MD ED   04/28/2020 1745 05/04/2020 1802 Full Code [id number]  Vaughan Basta, Edman Circle, PA-C ED   01/23/2020 1553 01/24/2020 1921 Full Code [id number]  Marty Heck, MD Inpatient   05/10/2019 1749 05/15/2019 1635 Full Code [id number]  Jean Rosenthal, MD ED   02/19/2019 1537 02/27/2019 2127 Full Code [id number]  Neva Seat, MD Inpatient   06/17/2018 0553 06/18/2018 1527 Full Code [id number]  Welford Roche, MD ED   01/03/2018 1332 01/04/2018 1910 Full Code [id number]  Mayo, Darla Lesches, PA-C Inpatient   03/15/2017 1226 03/15/2017 1849 Full Code [id number]  Nelva Bush, MD Inpatient   12/08/2015 2310 12/10/2015 1852 Full Code [id number]  Juliet Rude, MD Inpatient   11/18/2011 1811 11/26/2011 1416 Full Code [id number]  Clarene Duke, MD Inpatient         IV Access:   Peripheral IV   Procedures and diagnostic studies:   No results found.   Medical Consultants:   None.   Subjective:    [name] [name] relates his chest soreness is improved.  Objective:    Vitals:   02/04/22 1100 02/04/22 1514 02/04/22 2010 02/05/22 0347  BP: 112/74 127/86 (!) 134/93 125/87  Pulse:  85 88 81  Resp: '20 20 20   '$ Temp:  97.6 F (36.4 C) 98.1 F (36.7 C) 98.5 F (36.9 C)  TempSrc:  Oral Oral Oral  SpO2:  90%    Weight:      Height:       SpO2: 90 % O2 Flow Rate (L/min): 2 L/min FiO2 (%): 40 %   Intake/Output Summary (Last 24 hours) at 02/05/2022 0847 Last data filed at 02/05/2022 0025 Gross per 24 hour  Intake 825.41 ml  Output 301 ml  Net 524.41 ml    Filed Weights   02/02/22 1452 02/02/22 2124 02/03/22 0446  Weight: 76.2 kg 76.8 kg 76.8 kg    Exam: General exam: In no acute distress. Respiratory system: Good air movement and clear to  auscultation. Cardiovascular system: S1 & S2 heard, RRR. No JVD. Gastrointestinal system: Abdomen is nondistended, soft and nontender.  Extremities: No pedal edema. Skin: No rashes, lesions or ulcers Psychiatry: Judgement and insight appear normal. Mood & affect appropriate. Data Reviewed:    Labs: Basic Metabolic Panel: Recent Labs  Lab 02/01/22 0414 02/01/22 1625 02/02/22 0101 02/02/22 0115 02/03/22 0327 02/04/22 0036 02/05/22 0208  NA 139 137 137  --  137 136 137  K 3.5 3.5 3.3*  --  4.2 4.3 3.8  CL 101 101 102  --  105 103 104  CO2 '22 24 23  '$ --  22 21* 21*  GLUCOSE 156* 127* 113*  --  117* 90 91  BUN 29* 32* 34*  --  39* 44* 37*  CREATININE 2.11* 2.33* 2.38*  --  2.36* 2.17* 1.86*  CALCIUM 7.7* 7.2* 7.3*  --  7.5* 7.5* 7.8*  MG 1.4* 2.0  --  2.2 2.1  --   --  GFR Estimated Creatinine Clearance: 40.3 mL/min (A) (by C-G formula based on SCr of 1.86 mg/dL (H)). Liver Function Tests: Recent Labs  Lab 01/31/22 1306 01/31/22 1843 02/01/22 0414  AST 416* 598* 319*  ALT 406* 462* 297*  ALKPHOS 173* 228* 178*  BILITOT 0.3 0.5 0.5  PROT 5.8* 6.9 6.1*  ALBUMIN 2.6* 3.0* 2.8*    No results for input(s): "LIPASE", "AMYLASE" in the last 168 hours. No results for input(s): "AMMONIA" in the last 168 hours. Coagulation profile Recent Labs  Lab 01/31/22 1340  INR 1.5*    COVID-19 Labs  No results for input(s): "DDIMER", "FERRITIN", "LDH", "CRP" in the last 72 hours.  Lab Results  Component Value Date   SARSCOV2NAA NEGATIVE 01/31/2022   SARSCOV2NAA POSITIVE (A) 06/26/2020   SARSCOV2NAA NEGATIVE 05/08/2020   [place] NEGATIVE 04/28/2020    CBC: Recent Labs  Lab 01/31/22 1306 01/31/22 1426 02/01/22 0134 02/01/22 0414 02/03/22 0327 02/04/22 0036 02/05/22 0208  WBC 12.9*  --   --  15.1* 9.9 9.6 11.2*  NEUTROABS 4.5  --   --   --   --   --   --   HGB 13.2   < > 13.3 12.2* 9.5* 10.1* 10.0*  HCT 46.3   < > 39.0 36.9* 30.0* 31.3* 31.3*  MCV 91.1  --    --  78.8* 79.8* 80.1 80.7  PLT 142*  --   --  195 143* 196 219   < > = values in this interval not displayed.    Cardiac Enzymes: No results for input(s): "CKTOTAL", "CKMB", "CKMBINDEX", "TROPONINI" in the last 168 hours. BNP (last 3 results) No results for input(s): "PROBNP" in the last 8760 hours. CBG: Recent Labs  Lab 02/04/22 0814 02/04/22 1153 02/04/22 1624 02/04/22 2127 02/05/22 0741  GLUCAP 94 113* 120* 99 82    D-Dimer: No results for input(s): "DDIMER" in the last 72 hours. Hgb A1c: Recent Labs    02/03/22 0327  HGBA1C 5.1    Lipid Profile: No results for input(s): "CHOL", "HDL", "LDLCALC", "TRIG", "CHOLHDL", "LDLDIRECT" in the last 72 hours.  Thyroid function studies: No results for input(s): "TSH", "T4TOTAL", "T3FREE", "THYROIDAB" in the last 72 hours.  Invalid input(s): "FREET3" Anemia work up: No results for input(s): "VITAMINB12", "FOLATE", "FERRITIN", "TIBC", "IRON", "RETICCTPCT" in the last 72 hours. Sepsis Labs: Recent Labs  Lab 01/31/22 1300 01/31/22 1306 01/31/22 1306 02/01/22 0414 02/03/22 0327 02/04/22 0036 02/05/22 0208  WBC  --  12.9*   < > 15.1* 9.9 9.6 11.2*  LATICACIDVEN >9.0* >9.0*  --   --   --   --   --    < > = values in this interval not displayed.    Microbiology Recent Results (from the past 240 hour(s))  Resp Panel by RT-PCR (Flu A&B, Covid) Anterior Nasal Swab     Status: None   Collection Time: 01/31/22 12:36 PM   Specimen: Anterior Nasal Swab  Result Value Ref Range Status   SARS Coronavirus 2 by RT PCR NEGATIVE NEGATIVE Final    Comment: (NOTE) SARS-CoV-2 target nucleic acids are NOT DETECTED.  The SARS-CoV-2 RNA is generally detectable in upper respiratory specimens during the acute phase of infection. The lowest concentration of SARS-CoV-2 viral copies this assay can detect is 138 copies/mL. A negative result does not preclude SARS-Cov-2 infection and should not be used as the sole basis for treatment or other  patient management decisions. A negative result may occur with  improper specimen collection/handling, submission  of specimen other than nasopharyngeal swab, presence of viral mutation(s) within the areas targeted by this assay, and inadequate number of viral copies(<138 copies/mL). A negative result must be combined with clinical observations, patient history, and epidemiological information. The expected result is Negative.  Fact Sheet for Patients:  EntrepreneurPulse.com.au  Fact Sheet for Healthcare Providers:  IncredibleEmployment.be  This test is no t yet approved or cleared by the [place] FDA and  has been authorized for detection and/or diagnosis of SARS-CoV-2 by FDA under an Emergency Use Authorization (EUA). This EUA will remain  in effect (meaning this test can be used) for the duration of the COVID-19 declaration under Section 564(b)(1) of the Act, 21 U.S.C.section 360bbb-3(b)(1), unless the authorization is terminated  or revoked sooner.       Influenza A by PCR NEGATIVE NEGATIVE Final   Influenza B by PCR NEGATIVE NEGATIVE Final    Comment: (NOTE) The Xpert Xpress SARS-CoV-2/FLU/RSV plus assay is intended as an aid in the diagnosis of influenza from Nasopharyngeal swab specimens and should not be used as a sole basis for treatment. Nasal washings and aspirates are unacceptable for Xpert Xpress SARS-CoV-2/FLU/RSV testing.  Fact Sheet for Patients: EntrepreneurPulse.com.au  Fact Sheet for Healthcare Providers: IncredibleEmployment.be  This test is not yet approved or cleared by the [place] FDA and has been authorized for detection and/or diagnosis of SARS-CoV-2 by FDA under an Emergency Use Authorization (EUA). This EUA will remain in effect (meaning this test can be used) for the duration of the COVID-19 declaration under Section 564(b)(1) of the Act, 21 U.S.C. section  360bbb-3(b)(1), unless the authorization is terminated or revoked.  Performed at [facility], [place] [address] [place], [place] [address]   Culture, blood (routine x 2)     Status: None (Preliminary result)   Collection Time: 01/31/22 12:42 PM   Specimen: BLOOD  Result Value Ref Range Status   Specimen Description BLOOD BLOOD RIGHT HAND  Final   Special Requests   Final    BOTTLES DRAWN AEROBIC AND ANAEROBIC Blood Culture results may not be optimal due to an inadequate volume of blood received in culture bottles   Culture   Final    NO GROWTH 4 DAYS Performed at [facility], [place] [address], [place], [place] [address]    Report Status PENDING  Incomplete  Culture, blood (routine x 2)     Status: None (Preliminary result)   Collection Time: 01/31/22  6:48 PM   Specimen: BLOOD RIGHT HAND  Result Value Ref Range Status   Specimen Description BLOOD RIGHT HAND  Final   Special Requests   Final    BOTTLES DRAWN AEROBIC AND ANAEROBIC Blood Culture results may not be optimal due to an inadequate volume of blood received in culture bottles   Culture   Final    NO GROWTH 4 DAYS Performed at [facility], [place] [address] [place], [place] [address]    Report Status PENDING  Incomplete     Medications:    amiodarone  200 mg Oral BID   Followed by   Derrill Memo ON 02/10/2022] amiodarone  200 mg Oral Daily   aspirin  81 mg Oral Daily   atorvastatin  80 mg Oral Daily   clopidogrel  75 mg Oral Daily   docusate sodium  100 mg Oral BID   DULoxetine  30 mg Oral Daily   insulin aspart  0-15 Units Subcutaneous TID WC   polyethylene glycol  17  g Oral Daily   sodium chloride flush  3 mL Intravenous Q12H   Continuous Infusions:  sodium chloride 250 mL (02/05/22 0352)   ampicillin-sulbactam (UNASYN) IV 3 g (02/05/22 2979)   heparin 2,250 Units/hr (02/05/22 0025)      LOS: 5 days   Charlynne Cousins  [facility]  02/05/2022, 8:47 AM

## 2022-02-05 NOTE — H&P (View-Only) (Signed)
Progress Note  Patient Name: [name] [name] Date of Encounter: 02/05/2022  Primary Cardiologist: Candee Furbish, MD   Subjective   Denies chest pain or sob.   Inpatient Medications    Scheduled Meds:  amiodarone  200 mg Oral BID   Followed by   Derrill Memo ON 02/10/2022] amiodarone  200 mg Oral Daily   aspirin  81 mg Oral Daily   atorvastatin  80 mg Oral Daily   clopidogrel  75 mg Oral Daily   docusate sodium  100 mg Oral BID   DULoxetine  30 mg Oral Daily   insulin aspart  0-15 Units Subcutaneous TID WC   polyethylene glycol  17 g Oral Daily   Continuous Infusions:  sodium chloride     ampicillin-sulbactam (UNASYN) IV 3 g (02/05/22 0838)   heparin 2,250 Units/hr (02/05/22 0025)   PRN Meds: guaiFENesin-dextromethorphan, HYDROcodone-acetaminophen, ondansetron (ZOFRAN) IV, mouth rinse   Vital Signs    Vitals:   02/04/22 1100 02/04/22 1514 02/04/22 2010 02/05/22 0347  BP: 112/74 127/86 (!) 134/93 125/87  Pulse:  85 88 81  Resp: '20 20 20   '$ Temp:  97.6 F (36.4 C) 98.1 F (36.7 C) 98.5 F (36.9 C)  TempSrc:  Oral Oral Oral  SpO2:  90%    Weight:      Height:        Intake/Output Summary (Last 24 hours) at 02/05/2022 0958 Last data filed at 02/05/2022 0025 Gross per 24 hour  Intake 825.41 ml  Output 301 ml  Net 524.41 ml   Filed Weights   02/02/22 1452 02/02/22 2124 02/03/22 0446  Weight: 76.2 kg 76.8 kg 76.8 kg    Telemetry    nsr - Personally Reviewed  ECG    none - Personally Reviewed  Physical Exam   GEN: well appearing but diskempt, no acute distress.   Neck: 6 cm JVD Cardiac: RRR, no murmurs, rubs, or gallops.  Respiratory: Clear to auscultation bilaterally. GI: Soft, nontender, non-distended  MS: No edema; No deformity. Neuro:  Nonfocal  Psych: Normal affect   Labs    Chemistry Recent Labs  Lab 01/31/22 1306 01/31/22 1354 01/31/22 1843 02/01/22 0134 02/01/22 0414 02/01/22 1625 02/03/22 0327 02/04/22 0036 02/05/22 0208  NA 140   <  > 140   < > 139   < > 137 136 137  K 4.2   < > 3.9   < > 3.5   < > 4.2 4.3 3.8  CL 107  --  107  --  101   < > 105 103 104  CO2 10*  --  19*  --  22   < > 22 21* 21*  GLUCOSE 305*  --  236*  --  156*   < > 117* 90 91  BUN 17  --  23  --  29*   < > 39* 44* 37*  CREATININE 1.36*   < > 1.59*  --  2.11*   < > 2.36* 2.17* 1.86*  CALCIUM 8.9  --  8.5*  --  7.7*   < > 7.5* 7.5* 7.8*  PROT 5.8*  --  6.9  --  6.1*  --   --   --   --   ALBUMIN 2.6*  --  3.0*  --  2.8*  --   --   --   --   AST 416*  --  598*  --  319*  --   --   --   --  ALT 406*  --  462*  --  297*  --   --   --   --   ALKPHOS 173*  --  228*  --  178*  --   --   --   --   BILITOT 0.3  --  0.5  --  0.5  --   --   --   --   GFRNONAA 59*  --  49*  --  35*   < > 31* 34* 41*  ANIONGAP 23*  --  14  --  16*   < > '10 12 12   '$ < > = values in this interval not displayed.     Hematology Recent Labs  Lab 02/03/22 0327 02/04/22 0036 02/05/22 0208  WBC 9.9 9.6 11.2*  RBC 3.76* 3.91* 3.88*  HGB 9.5* 10.1* 10.0*  HCT 30.0* 31.3* 31.3*  MCV 79.8* 80.1 80.7  MCH 25.3* 25.8* 25.8*  MCHC 31.7 32.3 31.9  RDW 17.5* 17.8* 17.7*  PLT 143* 196 219    Cardiac EnzymesNo results for input(s): "TROPONINI" in the last 168 hours. No results for input(s): "TROPIPOC" in the last 168 hours.   BNPNo results for input(s): "BNP", "PROBNP" in the last 168 hours.   DDimer No results for input(s): "DDIMER" in the last 168 hours.   Radiology    No results found.  Cardiac Studies   See above  Patient Profile     61 y.o. male admitted with a VF arrest, prolonged down time and STEMI. He has had a nice recovery.  Assessment & Plan    VF arrest - he has had no arrhythmias on amiodarone. I would probably not continue this medication at discharged. Inferior STEMI - he denies angina. He will undergo left heart cath. As his creatinine has improved, I will put him on the add on schedule for tomorrow and make him NPO after breakfast.  Encephalopathy -  he has had a remarkable recovery with no obvious residual.      For questions or updates, please contact [facility] Please consult [email or web address] for contact info under Cardiology/STEMI.      Signed, Cristopher Peru, MD  02/05/2022, 9:58 AM

## 2022-02-05 NOTE — Progress Notes (Signed)
ANTICOAGULATION CONSULT NOTE -Follow Up Pharmacy Consult for Heparin  Indication: h/o VTE  Allergies  Allergen Reactions   Dilaudid [Hydromorphone Hcl] Rash   Ramipril Rash and Other (See Comments)    Per patient on R arm and leg only; no angioedema   Tramadol Itching and Rash    Patient Measurements: Height: '5\' 8"'$  (172.7 cm) Weight: 76.8 kg (169 lb 5 oz) IBW/kg (Calculated) : 68.4 Heparin Dosing Weight: 80 kg  Vital Signs: Temp: 98.5 F (36.9 C) (08/13 0347) Temp Source: Oral (08/13 0347) BP: 125/87 (08/13 0347) Pulse Rate: 81 (08/13 0347)  Labs: Recent Labs    02/03/22 0327 02/03/22 1126 02/04/22 0036 02/04/22 0946 02/04/22 1751 02/05/22 0208 02/05/22 0750  HGB 9.5*  --  10.1*  --   --  10.0*  --   HCT 30.0*  --  31.3*  --   --  31.3*  --   PLT 143*  --  196  --   --  219  --   HEPARINUNFRC 0.23*   < > 0.29*   < > 0.16* 0.31 0.33  CREATININE 2.36*  --  2.17*  --   --  1.86*  --    < > = values in this interval not displayed.     Estimated Creatinine Clearance: 40.3 mL/min (A) (by C-G formula based on SCr of 1.86 mg/dL (H)).  Assessment: 61 yo male admitted with STEMI, h/o VTE and Eliquis on hold, for heparin. Hgb low but stable, plts WNL. Heparin drip rate 2250 uts/hr heparin level therapeutic at 0.33 at 0750 following therapeutic level of 0.31 at 0200. Spoke with RN no issues with running the line and no s/sx of bleeding.  Goal of Therapy:  Heparin level 0.3-0.7 units/mL Monitor platelets by anticoagulation protocol: Yes   Plan:  Continue Heparin 2250 units/hr Monitor for s/sx of bleeding Check CBC and heparin level daily  Sandford [name], PharmD. [name] Acute Care PGY-1 02/05/2022 8:31 AM

## 2022-02-06 ENCOUNTER — Encounter (HOSPITAL_COMMUNITY)
Admission: EM | Disposition: A | Discharge status: Skilled Nursing Facility | Payer: Self-pay | Source: Home / Self Care | Attending: Internal Medicine

## 2022-02-06 ENCOUNTER — Encounter (HOSPITAL_COMMUNITY): Payer: Self-pay | Admitting: Cardiology

## 2022-02-06 DIAGNOSIS — I2510 Atherosclerotic heart disease of native coronary artery without angina pectoris: Secondary | ICD-10-CM | POA: Diagnosis not present

## 2022-02-06 DIAGNOSIS — I213 ST elevation (STEMI) myocardial infarction of unspecified site: Secondary | ICD-10-CM | POA: Diagnosis not present

## 2022-02-06 DIAGNOSIS — R570 Cardiogenic shock: Secondary | ICD-10-CM | POA: Diagnosis not present

## 2022-02-06 DIAGNOSIS — I469 Cardiac arrest, cause unspecified: Secondary | ICD-10-CM | POA: Diagnosis not present

## 2022-02-06 DIAGNOSIS — I739 Peripheral vascular disease, unspecified: Secondary | ICD-10-CM | POA: Diagnosis not present

## 2022-02-06 HISTORY — PX: LEFT HEART CATH AND CORONARY ANGIOGRAPHY: CATH118249

## 2022-02-06 LAB — CBC
HCT: 30.5 % — ABNORMAL LOW (ref 39.0–52.0)
Hemoglobin: 9.6 g/dL — ABNORMAL LOW (ref 13.0–17.0)
MCH: 25.5 pg — ABNORMAL LOW (ref 26.0–34.0)
MCHC: 31.5 g/dL (ref 30.0–36.0)
MCV: 81.1 fL (ref 80.0–100.0)
Platelets: 238 10*3/uL (ref 150–400)
RBC: 3.76 MIL/uL — ABNORMAL LOW (ref 4.22–5.81)
RDW: 17.6 % — ABNORMAL HIGH (ref 11.5–15.5)
WBC: 12.6 10*3/uL — ABNORMAL HIGH (ref 4.0–10.5)
nRBC: 0.3 % — ABNORMAL HIGH (ref 0.0–0.2)

## 2022-02-06 LAB — BASIC METABOLIC PANEL
Anion gap: 11 (ref 5–15)
BUN: 30 mg/dL — ABNORMAL HIGH (ref 8–23)
CO2: 21 mmol/L — ABNORMAL LOW (ref 22–32)
Calcium: 7.8 mg/dL — ABNORMAL LOW (ref 8.9–10.3)
Chloride: 106 mmol/L (ref 98–111)
Creatinine, Ser: 1.43 mg/dL — ABNORMAL HIGH (ref 0.61–1.24)
GFR, Estimated: 56 mL/min — ABNORMAL LOW (ref >60)
Glucose, Bld: 105 mg/dL — ABNORMAL HIGH (ref 70–99)
Potassium: 3.4 mmol/L — ABNORMAL LOW (ref 3.5–5.1)
Sodium: 138 mmol/L (ref 135–145)

## 2022-02-06 LAB — GLUCOSE, CAPILLARY
Glucose-Capillary: 97 mg/dL (ref 70–99)
Glucose-Capillary: 101 mg/dL — ABNORMAL HIGH (ref 70–99)
Glucose-Capillary: 82 mg/dL (ref 70–99)
Glucose-Capillary: 94 mg/dL (ref 70–99)

## 2022-02-06 LAB — HEPARIN LEVEL (UNFRACTIONATED): Heparin Unfractionated: 0.29 IU/mL — ABNORMAL LOW (ref 0.30–0.70)

## 2022-02-06 SURGERY — LEFT HEART CATH AND CORONARY ANGIOGRAPHY
Anesthesia: LOCAL

## 2022-02-06 MED ORDER — VERAPAMIL HCL 2.5 MG/ML IV SOLN
INTRAVENOUS | Status: AC
  Filled 2022-02-06: qty 2

## 2022-02-06 MED ORDER — LIDOCAINE HCL (PF) 1 % IJ SOLN
INTRAMUSCULAR | Status: AC
  Filled 2022-02-06: qty 30

## 2022-02-06 MED ORDER — HEPARIN SODIUM (PORCINE) 5000 UNIT/ML IJ SOLN: 5000.0000 [IU] | Freq: Three times a day (TID) | INTRAMUSCULAR | Status: DC

## 2022-02-06 MED ORDER — SODIUM CHLORIDE 0.9 % WEIGHT BASED INFUSION
1.0000 mL/kg/h | INTRAVENOUS | Status: AC
  Administered 2022-02-06: 1 mL/kg/h via INTRAVENOUS

## 2022-02-06 MED ORDER — IOHEXOL 350 MG/ML SOLN
INTRAVENOUS | Status: DC | PRN
  Administered 2022-02-06: 45 mL

## 2022-02-06 MED ORDER — POTASSIUM CHLORIDE CRYS ER 10 MEQ PO TBCR
60.0000 meq | EXTENDED_RELEASE_TABLET | Freq: Once | ORAL | Status: AC
  Administered 2022-02-06: 60 meq via ORAL
  Filled 2022-02-06: qty 6

## 2022-02-06 MED ORDER — ONDANSETRON HCL 4 MG/2ML IJ SOLN: 4.0000 mg | Freq: Four times a day (QID) | INTRAMUSCULAR | Status: DC | PRN

## 2022-02-06 MED ORDER — APIXABAN 5 MG PO TABS
5.0000 mg | ORAL_TABLET | Freq: Two times a day (BID) | ORAL | Status: DC
  Administered 2022-02-06 – 2022-02-08 (×4): 5 mg via ORAL
  Filled 2022-02-06 (×4): qty 1

## 2022-02-06 MED ORDER — HEPARIN (PORCINE) IN NACL 1000-0.9 UT/500ML-% IV SOLN
INTRAVENOUS | Status: DC | PRN
  Administered 2022-02-06 (×2): 500 mL

## 2022-02-06 MED ORDER — SODIUM CHLORIDE 0.9 % IV SOLN: 250.0000 mL | INTRAVENOUS | Status: DC | PRN

## 2022-02-06 MED ORDER — HEPARIN (PORCINE) IN NACL 1000-0.9 UT/500ML-% IV SOLN
INTRAVENOUS | Status: AC
  Filled 2022-02-06: qty 1000

## 2022-02-06 MED ORDER — LIDOCAINE HCL (PF) 1 % IJ SOLN
INTRAMUSCULAR | Status: DC | PRN
  Administered 2022-02-06: 2 mL

## 2022-02-06 MED ORDER — HEPARIN SODIUM (PORCINE) 1000 UNIT/ML IJ SOLN
INTRAMUSCULAR | Status: AC
  Filled 2022-02-06: qty 10

## 2022-02-06 MED ORDER — METOPROLOL TARTRATE 12.5 MG HALF TABLET
12.5000 mg | ORAL_TABLET | Freq: Two times a day (BID) | ORAL | Status: DC
  Administered 2022-02-06 – 2022-02-08 (×4): 12.5 mg via ORAL
  Filled 2022-02-06 (×4): qty 1

## 2022-02-06 MED ORDER — SODIUM CHLORIDE 0.9% FLUSH
3.0000 mL | Freq: Two times a day (BID) | INTRAVENOUS | Status: DC
  Administered 2022-02-07 – 2022-02-08 (×2): 3 mL via INTRAVENOUS

## 2022-02-06 MED ORDER — HEPARIN SODIUM (PORCINE) 1000 UNIT/ML IJ SOLN
INTRAMUSCULAR | Status: DC | PRN
  Administered 2022-02-06: 4000 [IU] via INTRAVENOUS

## 2022-02-06 MED ORDER — VERAPAMIL HCL 2.5 MG/ML IV SOLN
INTRAVENOUS | Status: DC | PRN
  Administered 2022-02-06: 10 mL via INTRA_ARTERIAL

## 2022-02-06 MED ORDER — FENTANYL CITRATE (PF) 100 MCG/2ML IJ SOLN
INTRAMUSCULAR | Status: AC
  Filled 2022-02-06: qty 2

## 2022-02-06 MED ORDER — ACETAMINOPHEN 325 MG PO TABS: 650.0000 mg | ORAL_TABLET | ORAL | Status: DC | PRN

## 2022-02-06 MED ORDER — SODIUM CHLORIDE 0.9% FLUSH: 3.0000 mL | INTRAVENOUS | Status: DC | PRN

## 2022-02-06 MED ORDER — MIDAZOLAM HCL 2 MG/2ML IJ SOLN
INTRAMUSCULAR | Status: AC
  Filled 2022-02-06: qty 2

## 2022-02-06 SURGICAL SUPPLY — 11 items
BAND CMPR LRG ZPHR (HEMOSTASIS) ×1
BAND ZEPHYR COMPRESS 30 LONG (HEMOSTASIS) ×1 IMPLANT
CATH 5FR JL3.5 JR4 ANG PIG MP (CATHETERS) ×1 IMPLANT
GLIDESHEATH SLEND SS 6F .021 (SHEATH) ×1 IMPLANT
GUIDEWIRE INQWIRE 1.5J.035X260 (WIRE) IMPLANT
INQWIRE 1.5J .035X260CM (WIRE) ×2
KIT HEART LEFT (KITS) ×3 IMPLANT
MAT PREVALON FULL STRYKER (MISCELLANEOUS) ×1 IMPLANT
PACK CARDIAC CATHETERIZATION (CUSTOM PROCEDURE TRAY) ×3 IMPLANT
TRANSDUCER W/STOPCOCK (MISCELLANEOUS) ×3 IMPLANT
TUBING CIL FLEX 10 FLL-RA (TUBING) ×3 IMPLANT

## 2022-02-06 NOTE — Progress Notes (Signed)
PT Cancellation Note  Patient Details Name: [name] [name] MRN: [id number] DOB: 28-Apr-1961   Cancelled Treatment:    Reason Eval/Treat Not Completed: Patient at procedure or test/unavailable Pt off floor at heart cath. Will follow up as time allows.   Marguarite Arbour A Yeriel Mineo 02/06/2022, 9:11 AM [name], PT, DPT Acute Rehabilitation Services Secure chat preferred Office [phone]

## 2022-02-06 NOTE — Progress Notes (Addendum)
Progress Note  Patient Name: [name] [name] Date of Encounter: 02/06/2022  [facility] Cardiologist: Candee Furbish, MD   Subjective   Some chest soreness, but no anginal symptoms. Planned for cath today.   Inpatient Medications    Scheduled Meds:  amiodarone  200 mg Oral BID   Followed by   Derrill Memo ON 02/10/2022] amiodarone  200 mg Oral Daily   aspirin  81 mg Oral Daily   atorvastatin  80 mg Oral Daily   clopidogrel  75 mg Oral Daily   docusate sodium  100 mg Oral BID   DULoxetine  30 mg Oral Daily   insulin aspart  0-15 Units Subcutaneous TID WC   pneumococcal 20-valent conjugate vaccine  0.5 mL Intramuscular Tomorrow-1000   polyethylene glycol  17 g Oral Daily   sodium chloride flush  3 mL Intravenous Q12H   Continuous Infusions:  sodium chloride Stopped (02/06/22 0505)   sodium chloride     sodium chloride 1 mL/kg/hr (02/06/22 0642)   ampicillin-sulbactam (UNASYN) IV Stopped (02/06/22 0543)   heparin 2,250 Units/hr (02/06/22 0642)   PRN Meds: sodium chloride, guaiFENesin-dextromethorphan, HYDROcodone-acetaminophen, ondansetron (ZOFRAN) IV, mouth rinse, sodium chloride flush   Vital Signs    Vitals:   02/05/22 0347 02/05/22 2150 02/06/22 0317 02/06/22 0500  BP: 125/87 137/81  122/89  Pulse: 81 100  83  Resp:    17  Temp: 98.5 F (36.9 C) 98.4 F (36.9 C)  98.4 F (36.9 C)  TempSrc: Oral Oral  Oral  SpO2:  92%  94%  Weight:   80 kg   Height:        Intake/Output Summary (Last 24 hours) at 02/06/2022 0820 Last data filed at 02/06/2022 7619 Gross per 24 hour  Intake 2937.36 ml  Output 625 ml  Net 2312.36 ml      02/06/2022    3:17 AM 02/03/2022    4:46 AM 02/02/2022    9:24 PM  Last 3 Weights  Weight (lbs) 176 lb 5.9 oz 169 lb 5 oz 169 lb 5 oz  Weight (kg) 80 kg 76.8 kg 76.8 kg      Telemetry    Sinus Rhythm - Personally Reviewed  ECG    No new tracing this morning  Physical Exam   GEN: Disheveled older WM  Neck: No JVD Cardiac: RRR, no  murmurs, rubs, or gallops.  Respiratory: Course Rhonchi GI: Soft, nontender, non-distended  MS: No edema; Right AKA Neuro:  Nonfocal  Psych: Normal affect   Labs    High Sensitivity Troponin:   Recent Labs  Lab 01/31/22 1306 01/31/22 1843  TROPONINIHS 2,599* 7,479*     Chemistry Recent Labs  Lab 01/31/22 1306 01/31/22 1354 01/31/22 1843 02/01/22 0134 02/01/22 0414 02/01/22 1625 02/02/22 0101 02/02/22 0115 02/03/22 0327 02/04/22 0036 02/05/22 0208  NA 140   < > 140   < > 139 137   < >  --  137 136 137  K 4.2   < > 3.9   < > 3.5 3.5   < >  --  4.2 4.3 3.8  CL 107  --  107  --  101 101   < >  --  105 103 104  CO2 10*  --  19*  --  22 24   < >  --  22 21* 21*  GLUCOSE 305*  --  236*  --  156* 127*   < >  --  117* 90 91  BUN 17  --  23  --  29* 32*   < >  --  39* 44* 37*  CREATININE 1.36*   < > 1.59*  --  2.11* 2.33*   < >  --  2.36* 2.17* 1.86*  CALCIUM 8.9  --  8.5*  --  7.7* 7.2*   < >  --  7.5* 7.5* 7.8*  MG  --   --   --    < > 1.4* 2.0  --  2.2 2.1  --   --   PROT 5.8*  --  6.9  --  6.1*  --   --   --   --   --   --   ALBUMIN 2.6*  --  3.0*  --  2.8*  --   --   --   --   --   --   AST 416*  --  598*  --  319*  --   --   --   --   --   --   ALT 406*  --  462*  --  297*  --   --   --   --   --   --   ALKPHOS 173*  --  228*  --  178*  --   --   --   --   --   --   BILITOT 0.3  --  0.5  --  0.5  --   --   --   --   --   --   GFRNONAA 59*  --  49*  --  35* 31*   < >  --  31* 34* 41*  ANIONGAP 23*  --  14  --  16* 12   < >  --  '10 12 12   '$ < > = values in this interval not displayed.    Lipids  Recent Labs  Lab 01/31/22 1306 02/01/22 0414  CHOL 165  --   TRIG 279* 226*  HDL 19*  --   LDLCALC 90  --   CHOLHDL 8.7  --     Hematology Recent Labs  Lab 02/03/22 0327 02/04/22 0036 02/05/22 0208  WBC 9.9 9.6 11.2*  RBC 3.76* 3.91* 3.88*  HGB 9.5* 10.1* 10.0*  HCT 30.0* 31.3* 31.3*  MCV 79.8* 80.1 80.7  MCH 25.3* 25.8* 25.8*  MCHC 31.7 32.3 31.9  RDW 17.5*  17.8* 17.7*  PLT 143* 196 219   Thyroid No results for input(s): "TSH", "FREET4" in the last 168 hours.  BNPNo results for input(s): "BNP", "PROBNP" in the last 168 hours.  DDimer No results for input(s): "DDIMER" in the last 168 hours.   Radiology    No results found.  Cardiac Studies   Echo: 01/31/22   IMPRESSIONS     1. Left ventricular ejection fraction, by estimation, is 60 to 65%. The  left ventricle has normal function. The left ventricle has no regional  wall motion abnormalities. Indeterminate diastolic filling due to E-A  fusion. There is the interventricular  septum is flattened in systole, consistent with right ventricular pressure  overload.   2. Right ventricular systolic function is severely reduced. The right  ventricular size is mildly enlarged. Tricuspid regurgitation signal is  inadequate for assessing PA pressure.   3. The mitral valve is normal in structure. No evidence of mitral valve  regurgitation.   4. The aortic valve was not well visualized. Aortic valve regurgitation  is not visualized. Aortic valve sclerosis is present,  with no evidence of  aortic valve stenosis.   Comparison(s): Prior images reviewed side by side. The right ventricular  hypertrophy is significantly worse.   Conclusion(s)/Recommendation(s): Discussed findings with primary team.   FINDINGS   Left Ventricle: Left ventricular ejection fraction, by estimation, is 60  to 65%. The left ventricle has normal function. The left ventricle has no  regional wall motion abnormalities. The left ventricular internal cavity  size was normal in size. There is   no left ventricular hypertrophy. The interventricular septum is flattened  in systole, consistent with right ventricular pressure overload.  Indeterminate diastolic filling due to E-A fusion.   Right Ventricle: McConnell's sign is present, strongly correlated with  acute cor pulmonale, most likely large pulmonary embolism. The right   ventricular size is mildly enlarged. No increase in right ventricular wall  thickness. Right ventricular systolic  function is severely reduced. Tricuspid regurgitation signal is inadequate  for assessing PA pressure.   Left Atrium: Left atrial size was normal in size.   Right Atrium: Right atrial size was normal in size.   Pericardium: There is no evidence of pericardial effusion.   Mitral Valve: The mitral valve is normal in structure. No evidence of  mitral valve regurgitation.   Tricuspid Valve: The tricuspid valve is normal in structure. Tricuspid  valve regurgitation is not demonstrated.   Aortic Valve: The aortic valve was not well visualized. Aortic valve  regurgitation is not visualized. Aortic valve sclerosis is present, with  no evidence of aortic valve stenosis.   Pulmonic Valve: The pulmonic valve was not well visualized.   Aorta: The aortic root is normal in size and structure.   Venous: IVC assessment for right atrial pressure unable to be performed  due to mechanical ventilation.   IAS/Shunts: No atrial level shunt detected by color flow Doppler.    Echo: 02/01/22   IMPRESSIONS     1. Left ventricular ejection fraction, by estimation, is 60 to 65%. The  left ventricle has normal function. Left ventricular diastolic parameters  are indeterminate. There is the interventricular septum is flattened in  systole, consistent with right  ventricular pressure overload.   2. The Right ventricle is severly dilated with hypokinesis in the basal  and mid portion but hyperkinesis in the apical portion - consistent with  McConnell's sign which was also present in the TTE performed 01/31/22. Right  ventricular systolic function is  moderately reduced. The right ventricular size is severely enlarged. There  is normal pulmonary artery systolic pressure.   3. The mitral valve is grossly normal. No evidence of mitral valve  regurgitation. No evidence of mitral stenosis.   4.  The aortic valve is grossly normal. Aortic valve regurgitation is  trivial. Aortic valve sclerosis is present, with no evidence of aortic  valve stenosis.   5. The inferior vena cava is normal in size with greater than 50%  respiratory variability, suggesting right atrial pressure of 3 mmHg.   FINDINGS   Left Ventricle: Left ventricular ejection fraction, by estimation, is 60  to 65%. The left ventricle has normal function. Definity contrast agent  was given IV to delineate the left ventricular endocardial borders. The  left ventricular internal cavity  size was small. The interventricular septum is flattened in systole,  consistent with right ventricular pressure overload. Left ventricular  diastolic parameters are indeterminate.      LV Wall Scoring:  The apical lateral segment, apical anterior segment, apical inferior  segment,  and apex are  akinetic. The anterior wall, antero-lateral wall, mid and  distal  anterior septum, inferior septum, inferior wall, and mid inferolateral  segment are normal.   Right Ventricle: The Right ventricle is severly dilated with hypokinesis  in the basal and mid portion but hyperkinesis in the apical portion -  consistent with McConnell's sign which was also present in the TTE  performed 01/31/22. The right ventricular size   is severely enlarged. Right ventricular systolic function is moderately  reduced. There is normal pulmonary artery systolic pressure. The tricuspid  regurgitant velocity is 2.43 m/s, and with an assumed right atrial  pressure of 3 mmHg, the estimated right   ventricular systolic pressure is 01.7 mmHg.   Left Atrium: Left atrial size was not assessed.   Right Atrium: Right atrial size was not assessed.   Pericardium: Presence of epicardial fat layer.   Mitral Valve: The mitral valve is grossly normal. No evidence of mitral  valve stenosis.   Tricuspid Valve: The tricuspid valve is grossly normal. Tricuspid valve   regurgitation is not demonstrated. No evidence of tricuspid stenosis.   Aortic Valve: The aortic valve is grossly normal. Aortic valve  regurgitation is trivial. Aortic valve sclerosis is present, with no  evidence of aortic valve stenosis.   Pulmonic Valve: The pulmonic valve was not well visualized.   Aorta: The aortic root is normal in size and structure and the ascending  aorta was not well visualized.   Venous: The inferior vena cava is normal in size with greater than 50%  respiratory variability, suggesting right atrial pressure of 3 mmHg.   Patient Profile     62 y.o. male  with PAD s/p right AKA and multiple prior interventions, nonobstructive CAD, mild to moderate AI,  and Libman-Sacks endocarditis (echo 2013), and RA who is being seen 01/31/2022 for the evaluation of cardiac arrest at the request of Dr. Regenia Skeeter.  Assessment & Plan    Cardiac arrest Ventricular fibrillation Acute hypoxic respiratory failure --Found unresponsive by bystander and had 35 minutes of CPR.  Initially intubated and required pressor support. S/p extubation 8/9.  High-sensitivity troponin peaked at 7479. --Remains on IV heparin --Planned for cardiac cath today, Cr improved from 2.3>>2.1>>1.86 --Continue aspirin, 80 mg daily, Plavix 75 mg daily --has been transitioned from IV amiodarone, now on '200mg'$  BID   Recent DVT PE Severely dilated RV --Was on Eliquis PTA --Currently on IV heparin with plans for ischemic evaluation   AKI s/p cardiac arrest: Creatinine 1.1, peaked at 2.38 postcardiac arrest and hypoperfusion. Improved to 1.86 --Currently on IV fluids --Follow BMET   PVD s/p left AKA -- Wheelchair-bound at baseline   Prior CVA: Subacute occipital CVA several weeks prior to admission. --Continue Plavix and statin   Shock Liver: in the setting of cardiac arrest -- improving   For questions or updates, please contact [place] Please consult [email or web address] for contact info  under        Signed, Reino Bellis, NP  02/06/2022, 8:20 AM    Patient seen, examined. Available data reviewed. Agree with findings, assessment, and plan as outlined by Reino Bellis, NP. On my exam, the patient is an elderly male in NAD. Lungs CTA, heart RRR no murmur or gallop, abd: soft, NT, ext: no edema, left AKA site noted and clear, right arm with ecchymosis but no hematoma. Cath films reviewed - occluded non-dominant RCA without any other high grade obstructive disease - agree medical therapy is appropriate. The patient has had a cardiac arrest  in the setting of RV dilatation, recent DVT/PE, and occlusion of a non-dominant RCA appropriate for medical therapy. He has had an inferior infarct by EKG, even though echo shows normal wall motion. I suspect this was the cause of his VF arrest. As above, he will continue with med Rx (clopidogrel, eliquis, add beta blocker as tolerated).   Today: stop ASA to avoid 'triple therapy,' start metoprolol at low dose, stop Amiodarone.  Sherren Mocha, M.D. 02/06/2022 2:51 PM

## 2022-02-06 NOTE — Progress Notes (Signed)
[facility] PROGRESS NOTE    Progress Note  [name] [name]  YBO:[id number] DOB: 12-28-1960 DOA: 01/31/2022 PCP: Angelique Blonder, DO     Brief Narrative:   [name] [name] is an 61 y.o. male past medical history of peripheral artery disease acutely been ischemia status post left AKA in 2021, partially occlusive external iliac vein and femoral vein, recent right popliteal DVT on DOAC tobacco abuse, hyperlipidemia, essential hypertension who resides in a nursing home most recently seen in consultation in the emergency room for DVT and started on a DOAC EMS complaining of lower extremity pain became unresponsive and pulseless CPR was started by bystanders upon EMS arrival was found in ventricular tachycardia defibrillated x 3 and given amiodarone bolus, 35 minutes till ROSC in the ED twelve-lead EKG showed ST segment elevation, Glascow score of 3, cardiology felt not a candidate for left heart cath given severity of illness  Significant Events: 01/31/2022 s/p cardiac arrest defibrillated x 3 started on a heparin drip, amiodarone bolus given Glascow score 3. 01/31/2022 intubation  Significant studies: CT angio showed acute segmental PE no right heart strain's, multiple rib fractures 2-7 CT of the head showed left subacute on chronic left inferior occipital infarct    Assessment/Plan:   Cardiac arrest ([place]) secondary to V-fib/chronic right ventricular failure: Intubated on admission for shock improved rapidly extubated and off pressors. Cardiology consulted suspect etiology is ischemic etiology and anticipate left heart cath once creatinine has improved.  Creatinine yesterday was 1.8 basic metabolic panels pending. Now on oral amiodarone and IV heparin. 2D echo showed preserved EF anterior apical wall motion abnormality Continue Plavix and aspirin.  Acute respiratory failure with hypoxia possibly due to aspiration pneumonia: Multifactorial possibly due to small PEs and aspiration  pneumonia. Currently on IV Unasyn will complete a 5-day course. Has remained afebrile leukocytosis improved.  ATN postcardiac arrest:: With a baseline creatinine of less than 1, creatinine peaked at 2.3 He was started on IV fluid hydration and after several days his creatinine started coming down slowly. Continue strict I's and O's and daily weights KVO IV fluids.  His creatinine today is 1.8. Basic metabolic panel is pending today.  Recent DVT and PE/severely dilated RV: Currently on IV heparin for possible ACS. Cardiology plans for ischemic evaluation.  Metabolic encephalopathy: Likely due to cardiac arrest EEG shows severe diffuse encephalopathy nonspecific no signs of epileptic focus.  History of PVD status post left AKA: [place]ump is clean.  Shock liver: In the setting of cardiac arrest LFTs improving with supportive management.    Hyperglycemia: Likely stress margination was placed on insulin drip now on sliding scale insulin, continue sliding scale insulin. A1c of 5.0 does not require insulin in the last 24 hours.  Sickle decubitus ulcer stage I present on admission RN Pressure Injury Documentation: Pressure Injury 01/31/22 Buttocks Right Stage 1 -  Intact skin with non-blanchable redness of a localized area usually over a bony prominence. (Active)  01/31/22 1615  Location: Buttocks  Location Orientation: Right  Staging: Stage 1 -  Intact skin with non-blanchable redness of a localized area usually over a bony prominence.  Wound Description (Comments):   Present on Admission: Yes  Dressing Type Foam - Lift dressing to assess site every shift 02/02/22 2100      DVT prophylaxis: heparin Family Communication:none Status is: Inpatient Remains inpatient appropriate because: Cardiac arrest and V-fib    Code Status:     Code Status Orders  (From admission, onward)  Start     Ordered   02/01/22 1536  Full code  Continuous        02/01/22 1535            Code Status History     Date Active Date Inactive Code Status Order ID Comments User Context   01/31/2022 1515 02/01/2022 1535 DNR [id number]  Erick Colace, NP ED   01/31/2022 1420 01/31/2022 1515 Full Code [id number]  Erick Colace, NP ED   05/08/2020 1338 05/14/2020 1726 Full Code [id number]  Elam Dutch, MD ED   04/28/2020 1745 05/04/2020 1802 Full Code [id number]  Vaughan Basta, Edman Circle, PA-C ED   01/23/2020 1553 01/24/2020 1921 Full Code [id number]  Marty Heck, MD Inpatient   05/10/2019 1749 05/15/2019 1635 Full Code [id number]  Jean Rosenthal, MD ED   02/19/2019 1537 02/27/2019 2127 Full Code [id number]  Neva Seat, MD Inpatient   06/17/2018 0553 06/18/2018 1527 Full Code [id number]  Welford Roche, MD ED   01/03/2018 1332 01/04/2018 1910 Full Code [id number]  Mayo, Darla Lesches, PA-C Inpatient   03/15/2017 1226 03/15/2017 1849 Full Code [id number]  Nelva Bush, MD Inpatient   12/08/2015 2310 12/10/2015 1852 Full Code [id number]  Juliet Rude, MD Inpatient   11/18/2011 1811 11/26/2011 1416 Full Code [id number]  Clarene Duke, MD Inpatient         IV Access:   Peripheral IV   Procedures and diagnostic studies:   No results found.   Medical Consultants:   None.   Subjective:    [name] [name] has any chest pain today.  Objective:    Vitals:   02/05/22 0347 02/05/22 2150 02/06/22 0317 02/06/22 0500  BP: 125/87 137/81  122/89  Pulse: 81 100  83  Resp:    17  Temp: 98.5 F (36.9 C) 98.4 F (36.9 C)  98.4 F (36.9 C)  TempSrc: Oral Oral  Oral  SpO2:  92%  94%  Weight:   80 kg   Height:       SpO2: 94 % O2 Flow Rate (L/min): 3 L/min FiO2 (%): 40 %   Intake/Output Summary (Last 24 hours) at 02/06/2022 0747 Last data filed at 02/06/2022 6789 Gross per 24 hour  Intake 2937.36 ml  Output 625 ml  Net 2312.36 ml    Filed Weights   02/02/22 2124 02/03/22 0446 02/06/22 0317  Weight: 76.8 kg 76.8 kg 80 kg    Exam: General exam: In no  acute distress. Respiratory system: Good air movement and clear to auscultation. Cardiovascular system: S1 & S2 heard, RRR. No JVD. Gastrointestinal system: Abdomen is nondistended, soft and nontender.  Extremities: No pedal edema. Skin: No rashes, lesions or ulcers Psychiatry: Judgement and insight appear normal. Mood & affect appropriate. Data Reviewed:    Labs: Basic Metabolic Panel: Recent Labs  Lab 02/01/22 0414 02/01/22 1625 02/02/22 0101 02/02/22 0115 02/03/22 0327 02/04/22 0036 02/05/22 0208  NA 139 137 137  --  137 136 137  K 3.5 3.5 3.3*  --  4.2 4.3 3.8  CL 101 101 102  --  105 103 104  CO2 '22 24 23  '$ --  22 21* 21*  GLUCOSE 156* 127* 113*  --  117* 90 91  BUN 29* 32* 34*  --  39* 44* 37*  CREATININE 2.11* 2.33* 2.38*  --  2.36* 2.17* 1.86*  CALCIUM 7.7* 7.2* 7.3*  --  7.5* 7.5* 7.8*  MG 1.4* 2.0  --  2.2 2.1  --   --  GFR Estimated Creatinine Clearance: 40.3 mL/min (A) (by C-G formula based on SCr of 1.86 mg/dL (H)). Liver Function Tests: Recent Labs  Lab 01/31/22 1306 01/31/22 1843 02/01/22 0414  AST 416* 598* 319*  ALT 406* 462* 297*  ALKPHOS 173* 228* 178*  BILITOT 0.3 0.5 0.5  PROT 5.8* 6.9 6.1*  ALBUMIN 2.6* 3.0* 2.8*    No results for input(s): "LIPASE", "AMYLASE" in the last 168 hours. No results for input(s): "AMMONIA" in the last 168 hours. Coagulation profile Recent Labs  Lab 01/31/22 1340  INR 1.5*    COVID-19 Labs  No results for input(s): "DDIMER", "FERRITIN", "LDH", "CRP" in the last 72 hours.  Lab Results  Component Value Date   SARSCOV2NAA NEGATIVE 01/31/2022   SARSCOV2NAA POSITIVE (A) 06/26/2020   SARSCOV2NAA NEGATIVE 05/08/2020   [place] NEGATIVE 04/28/2020    CBC: Recent Labs  Lab 01/31/22 1306 01/31/22 1426 02/01/22 0134 02/01/22 0414 02/03/22 0327 02/04/22 0036 02/05/22 0208  WBC 12.9*  --   --  15.1* 9.9 9.6 11.2*  NEUTROABS 4.5  --   --   --   --   --   --   HGB 13.2   < > 13.3 12.2* 9.5* 10.1*  10.0*  HCT 46.3   < > 39.0 36.9* 30.0* 31.3* 31.3*  MCV 91.1  --   --  78.8* 79.8* 80.1 80.7  PLT 142*  --   --  195 143* 196 219   < > = values in this interval not displayed.    Cardiac Enzymes: No results for input(s): "CKTOTAL", "CKMB", "CKMBINDEX", "TROPONINI" in the last 168 hours. BNP (last 3 results) No results for input(s): "PROBNP" in the last 8760 hours. CBG: Recent Labs  Lab 02/04/22 2127 02/05/22 0741 02/05/22 1253 02/05/22 1616 02/05/22 2154  GLUCAP 99 82 82 87 173*    D-Dimer: No results for input(s): "DDIMER" in the last 72 hours. Hgb A1c: No results for input(s): "HGBA1C" in the last 72 hours.  Lipid Profile: No results for input(s): "CHOL", "HDL", "LDLCALC", "TRIG", "CHOLHDL", "LDLDIRECT" in the last 72 hours.  Thyroid function studies: No results for input(s): "TSH", "T4TOTAL", "T3FREE", "THYROIDAB" in the last 72 hours.  Invalid input(s): "FREET3" Anemia work up: No results for input(s): "VITAMINB12", "FOLATE", "FERRITIN", "TIBC", "IRON", "RETICCTPCT" in the last 72 hours. Sepsis Labs: Recent Labs  Lab 01/31/22 1300 01/31/22 1306 01/31/22 1306 02/01/22 0414 02/03/22 0327 02/04/22 0036 02/05/22 0208  WBC  --  12.9*   < > 15.1* 9.9 9.6 11.2*  LATICACIDVEN >9.0* >9.0*  --   --   --   --   --    < > = values in this interval not displayed.    Microbiology Recent Results (from the past 240 hour(s))  Resp Panel by RT-PCR (Flu A&B, Covid) Anterior Nasal Swab     Status: None   Collection Time: 01/31/22 12:36 PM   Specimen: Anterior Nasal Swab  Result Value Ref Range Status   SARS Coronavirus 2 by RT PCR NEGATIVE NEGATIVE Final    Comment: (NOTE) SARS-CoV-2 target nucleic acids are NOT DETECTED.  The SARS-CoV-2 RNA is generally detectable in upper respiratory specimens during the acute phase of infection. The lowest concentration of SARS-CoV-2 viral copies this assay can detect is 138 copies/mL. A negative result does not preclude  SARS-Cov-2 infection and should not be used as the sole basis for treatment or other patient management decisions. A negative result may occur with  improper specimen collection/handling, submission of specimen  other than nasopharyngeal swab, presence of viral mutation(s) within the areas targeted by this assay, and inadequate number of viral copies(<138 copies/mL). A negative result must be combined with clinical observations, patient history, and epidemiological information. The expected result is Negative.  Fact Sheet for Patients:  EntrepreneurPulse.com.au  Fact Sheet for Healthcare Providers:  IncredibleEmployment.be  This test is no t yet approved or cleared by the [place] FDA and  has been authorized for detection and/or diagnosis of SARS-CoV-2 by FDA under an Emergency Use Authorization (EUA). This EUA will remain  in effect (meaning this test can be used) for the duration of the COVID-19 declaration under Section 564(b)(1) of the Act, 21 U.S.C.section 360bbb-3(b)(1), unless the authorization is terminated  or revoked sooner.       Influenza A by PCR NEGATIVE NEGATIVE Final   Influenza B by PCR NEGATIVE NEGATIVE Final    Comment: (NOTE) The Xpert Xpress SARS-CoV-2/FLU/RSV plus assay is intended as an aid in the diagnosis of influenza from Nasopharyngeal swab specimens and should not be used as a sole basis for treatment. Nasal washings and aspirates are unacceptable for Xpert Xpress SARS-CoV-2/FLU/RSV testing.  Fact Sheet for Patients: EntrepreneurPulse.com.au  Fact Sheet for Healthcare Providers: IncredibleEmployment.be  This test is not yet approved or cleared by the [place] FDA and has been authorized for detection and/or diagnosis of SARS-CoV-2 by FDA under an Emergency Use Authorization (EUA). This EUA will remain in effect (meaning this test can be used) for the duration of  the COVID-19 declaration under Section 564(b)(1) of the Act, 21 U.S.C. section 360bbb-3(b)(1), unless the authorization is terminated or revoked.  Performed at [facility], [place] [address] [place], [place] [address]   Culture, blood (routine x 2)     Status: None   Collection Time: 01/31/22 12:42 PM   Specimen: BLOOD  Result Value Ref Range Status   Specimen Description BLOOD BLOOD RIGHT HAND  Final   Special Requests   Final    BOTTLES DRAWN AEROBIC AND ANAEROBIC Blood Culture results may not be optimal due to an inadequate volume of blood received in culture bottles   Culture   Final    NO GROWTH 5 DAYS Performed at [facility], [place] [address], [place], [place] [address]    Report Status 02/05/2022 FINAL  Final  Culture, blood (routine x 2)     Status: None   Collection Time: 01/31/22  6:48 PM   Specimen: BLOOD RIGHT HAND  Result Value Ref Range Status   Specimen Description BLOOD RIGHT HAND  Final   Special Requests   Final    BOTTLES DRAWN AEROBIC AND ANAEROBIC Blood Culture results may not be optimal due to an inadequate volume of blood received in culture bottles   Culture   Final    NO GROWTH 5 DAYS Performed at [facility], [place] [address] [place], [place] [address]    Report Status 02/05/2022 FINAL  Final     Medications:    amiodarone  200 mg Oral BID   Followed by   Derrill Memo ON 02/10/2022] amiodarone  200 mg Oral Daily   aspirin  81 mg Oral Daily   atorvastatin  80 mg Oral Daily   clopidogrel  75 mg Oral Daily   docusate sodium  100 mg Oral BID   DULoxetine  30 mg Oral Daily   insulin aspart  0-15 Units Subcutaneous TID WC   pneumococcal 20-valent conjugate vaccine  0.5 mL Intramuscular  Tomorrow-1000   polyethylene glycol  17 g Oral Daily   sodium chloride flush  3 mL Intravenous Q12H   Continuous Infusions:  sodium chloride Stopped (02/06/22 0505)   sodium chloride     sodium chloride 1 mL/kg/hr (02/06/22 1610)   ampicillin-sulbactam  (UNASYN) IV Stopped (02/06/22 0543)   heparin 2,250 Units/hr (02/06/22 9604)      LOS: 6 days   Charlynne Cousins  [facility]  02/06/2022, 7:47 AM

## 2022-02-06 NOTE — Interval H&P Note (Signed)
History and Physical Interval Note:  02/06/2022 8:59 AM  [name] [name]  has presented today for surgery, with the diagnosis of cardiac arrest.  The various methods of treatment have been discussed with the patient and family. After consideration of risks, benefits and other options for treatment, the patient has consented to  Procedure(s): LEFT HEART CATH AND CORONARY ANGIOGRAPHY (N/A) as a surgical intervention.  The patient's history has been reviewed, patient examined, no change in status, stable for surgery.  I have reviewed the patient's chart and labs.  Questions were answered to the patient's satisfaction.   Cath Lab Visit (complete for each Cath Lab visit)  Clinical Evaluation Leading to the Procedure:   ACS: Yes.    Non-ACS:    Anginal Classification: CCS IV  Anti-ischemic medical therapy: No Therapy  Non-Invasive Test Results: No non-invasive testing performed  Prior CABG: No previous CABG        [name] 02/06/2022. 8:59 AM

## 2022-02-07 ENCOUNTER — Other Ambulatory Visit (HOSPITAL_COMMUNITY): Payer: Self-pay

## 2022-02-07 DIAGNOSIS — G931 Anoxic brain damage, not elsewhere classified: Secondary | ICD-10-CM | POA: Diagnosis not present

## 2022-02-07 DIAGNOSIS — J9601 Acute respiratory failure with hypoxia: Secondary | ICD-10-CM | POA: Diagnosis not present

## 2022-02-07 DIAGNOSIS — J690 Pneumonitis due to inhalation of food and vomit: Secondary | ICD-10-CM | POA: Diagnosis not present

## 2022-02-07 DIAGNOSIS — I469 Cardiac arrest, cause unspecified: Secondary | ICD-10-CM | POA: Diagnosis not present

## 2022-02-07 LAB — CBC
HCT: 31.8 % — ABNORMAL LOW (ref 39.0–52.0)
Hemoglobin: 10 g/dL — ABNORMAL LOW (ref 13.0–17.0)
MCH: 25.6 pg — ABNORMAL LOW (ref 26.0–34.0)
MCHC: 31.4 g/dL (ref 30.0–36.0)
MCV: 81.5 fL (ref 80.0–100.0)
Platelets: 283 10*3/uL (ref 150–400)
RBC: 3.9 MIL/uL — ABNORMAL LOW (ref 4.22–5.81)
RDW: 18.3 % — ABNORMAL HIGH (ref 11.5–15.5)
WBC: 14.6 10*3/uL — ABNORMAL HIGH (ref 4.0–10.5)
nRBC: 0.3 % — ABNORMAL HIGH (ref 0.0–0.2)

## 2022-02-07 LAB — GLUCOSE, CAPILLARY
Glucose-Capillary: 66 mg/dL — ABNORMAL LOW (ref 70–99)
Glucose-Capillary: 76 mg/dL (ref 70–99)
Glucose-Capillary: 93 mg/dL (ref 70–99)
Glucose-Capillary: 81 mg/dL (ref 70–99)
Glucose-Capillary: 85 mg/dL (ref 70–99)

## 2022-02-07 LAB — BASIC METABOLIC PANEL
Anion gap: 9 (ref 5–15)
BUN: 27 mg/dL — ABNORMAL HIGH (ref 8–23)
CO2: 22 mmol/L (ref 22–32)
Calcium: 8.1 mg/dL — ABNORMAL LOW (ref 8.9–10.3)
Chloride: 107 mmol/L (ref 98–111)
Creatinine, Ser: 1.29 mg/dL — ABNORMAL HIGH (ref 0.61–1.24)
GFR, Estimated: >60 mL/min (ref >60)
Glucose, Bld: 80 mg/dL (ref 70–99)
Potassium: 4.1 mmol/L (ref 3.5–5.1)
Sodium: 138 mmol/L (ref 135–145)

## 2022-02-07 MED ORDER — SODIUM CHLORIDE 0.9 % IV SOLN: INTRAVENOUS | Status: AC

## 2022-02-07 MED ORDER — METOPROLOL TARTRATE 25 MG PO TABS
12.5000 mg | ORAL_TABLET | Freq: Two times a day (BID) | ORAL | 0 refills | Status: DC
  Filled 2022-02-07: qty 60, 60d supply, fill #0

## 2022-02-07 NOTE — TOC Initial Note (Addendum)
Transition of Care [facility]) - Initial/Assessment Note    Patient Details  Name: [name] [name] MRN: [id number] Date of Birth: 1961/01/08  [facility] of [facility]) CM/SW Contact:    Milas Gain, [place] Phone Number: 02/07/2022, 12:02 PM  Clinical Narrative:                  CSW received consult for possible SNF placement at time of discharge. CSW spoke with patient at bedside regarding PT recommendation of SNF placement at time of discharge. Patient reports he comes from home with [place] and her spouse.Patient expressed understanding of PT recommendation and is agreeable to SNF placement at time of discharge.Patient gave CSW permission to fax out initial referral near the [place] area.CSW discussed insurance authorization process with patient. Patient reports he has not received the COVID vaccines. No further questions reported at this time. CSW to continue to follow and assist with discharge planning needs.   CSW started insurance authorization for patient. Reference # [id number].   Expected Discharge Plan: [place] Barriers to Discharge: Continued Medical Work up   Patient Goals and CMS Choice Patient states their goals for this hospitalization and ongoing recovery are:: to return home with caregivers.   Choice offered to / list presented to : Patient (Adult Care giver.)  Expected Discharge Plan and Services Expected Discharge Plan: [place] In-house Referral: NA Discharge Planning Services: CM Consult Post Acute Care Choice: NA Living arrangements for the past 2 months: Single Family Home Expected Discharge Date: 02/07/22                 DME Agency: NA       HH Arranged: PT, OT HH Agency: [place] Date [place]: 02/02/22 Time [place]: 77 Representative spoke with at [place]: [place] Arrangements/Services Living arrangements for the past 2 months: [place]  with:: Self, Other (Comment) (Caregiver) Patient language and need for interpreter reviewed:: Yes Do you feel safe going back to the place where you live?: Yes      Need for Family Participation in Patient Care: Yes (Comment) Care giver support system in place?: Yes (comment) Current home services: DME (Patient has a wheelchair) Criminal Activity/Legal Involvement Pertinent to Current Situation/Hospitalization: No - Comment as needed  Activities of Daily Living Home Assistive Devices/Equipment: Wheelchair ADL Screening (condition at time of admission) Patient's cognitive ability adequate to safely complete daily activities?: Yes Is the patient deaf or have difficulty hearing?: No Does the patient have difficulty seeing, even when wearing glasses/contacts?: No Does the patient have difficulty concentrating, remembering, or making decisions?: No Patient able to express need for assistance with ADLs?: Yes Does the patient have difficulty dressing or bathing?: Yes Independently performs ADLs?: No Communication: Independent Dressing (OT): Needs assistance Is this a change from baseline?: Change from baseline, expected to last <3days Grooming: Needs assistance Is this a change from baseline?: Change from baseline, expected to last <3 days Feeding: Independent Bathing: Needs assistance Is this a change from baseline?: Change from baseline, expected to last <3 days Toileting: Needs assistance Is this a change from baseline?: Change from baseline, expected to last <3 days Walks in Home: Needs assistance Is this a change from baseline?: Change from baseline, expected to last <3 days Does the patient have difficulty walking or climbing stairs?: Yes Weakness of Legs: Right Weakness of Arms/Hands: Both  Permission Sought/Granted Permission sought to share information with :  Family Supports, Customer service manager, Case Manager Permission granted to share information with : Yes, Verbal  Permission Granted     Permission granted to share info w AGENCY: [facility].        Emotional Assessment Appearance:: Appears stated age     Orientation: : Oriented to Self Alcohol / Substance Use: Not Applicable Psych Involvement: No (comment)  Admission diagnosis:  Cardiac arrest ([place]) [I46.9] Acute respiratory failure with hypoxia and hypercapnia ([place]) [J96.01, J96.02] ST elevation myocardial infarction (STEMI), unspecified artery ([place]) [I21.3] Patient Active Problem List   Diagnosis Date Noted   Hx of AKA (above knee amputation), left ([place]) 01/31/2022   Cardiac arrest () 01/31/2022   Cardiogenic shock ([place]) 01/31/2022   DVT (deep venous thrombosis) ([place]) 01/31/2022   Anoxic brain injury ([place]) 01/31/2022   Acute respiratory failure ([place]) 01/31/2022   Lactic acidosis 01/31/2022   Thrombocytopenia ([place]) 01/31/2022   Hyperglycemia 01/31/2022   STEMI (ST elevation myocardial infarction) ([place]) 01/31/2022   Pulmonary emboli ([place]) 01/31/2022   Multiple fractures of ribs, left side, initial encounter for closed fracture 01/31/2022   Sternal fracture 01/31/2022   Pulmonary contusion 01/31/2022   Aspiration pneumonia ([place]) 01/31/2022   H/O: stroke 01/31/2022   Pressure injury of skin 01/31/2022   Bite wound of left hand 09/27/2021   Arm pain 07/25/2021   Paronychia of great toe 01/28/2021   Onychomycosis 01/28/2021   Critical limb ischemia with history of revascularization of same extremity ([place]) 04/28/2020   History of COVID-19 05/10/2019   PAD (peripheral artery disease) ([place])    History of critical lower limb ischemia 02/19/2019   S/P lumbar fusion, Lower back pain 01/03/2018   Coronary artery disease involving native coronary artery of native heart with unstable angina pectoris ([place])    Preventative health care 03/13/2017   Transaminitis 12/16/2015   Bilateral shoulder pain 08/20/2012   Tobacco use disorder 08/20/2012   HTN (hypertension) 04/20/2011   HLD  (hyperlipidemia) 10/08/2009   Rheumatoid arthritis ([place]) 08/11/2009   PCP:  Angelique Blonder, DO Pharmacy:   [id number] [place] [id number]: [phone]: [phone] [facility] [address]Phone: [phone]: [phone]    Social Determinants of Health (SDOH) Interventions    Readmission Risk Interventions    05/13/2020    2:32 PM 05/04/2020   12:24 PM  Readmission Risk Prevention Plan  Post Dischage Appt  Complete  Medication Screening  Complete  Transportation Screening Complete Complete  PCP or Specialist Appt within 5-7 Days Complete   Home Care Screening Complete   Medication Review (RN CM) Complete

## 2022-02-07 NOTE — TOC Progression Note (Signed)
Transition of Care [facility]) - Progression Note    Patient Details  Name: LENWOOD BALSAM MRN: [id number] Date of Birth: April 27, 1961  Transition [facility] Care [facility]) CM/SW [place] Phone Number: 02/07/2022, 5:31 PM  Clinical Narrative:     CSW provided patient with SNF bed offers. Patient chose SNF placement at [facility]. CSW to follow up with Perrin Smack with [facility] to confirm SNF bed for patient tomorrow. CSW will continue to follow and assist with patients dc planning needs.  Expected Discharge Plan: [place] Barriers to Discharge: Continued Medical Work up  Expected Discharge Plan and Services Expected Discharge Plan: [place] In-house Referral: NA Discharge Planning Services: CM Consult Post Acute Care Choice: NA Living arrangements for the past 2 months: Single Family Home Expected Discharge Date: 02/07/22                 DME Agency: NA       HH Arranged: PT, OT HH Agency: [place] Date [place]: 02/02/22 Time [place]: 4628 Representative spoke with at [place]: [place] ([place]) Interventions    Readmission Risk Interventions    05/13/2020    2:32 PM 05/04/2020   12:24 PM  Readmission Risk Prevention Plan  Post Dischage Appt  Complete  Medication Screening  Complete  Transportation Screening Complete Complete  PCP or Specialist Appt within 5-7 Days Complete   Home Care Screening Complete   Medication Review (RN CM) Complete

## 2022-02-07 NOTE — Progress Notes (Signed)
Hypoglycemic Event  CBG: 66  Treatment: 4 oz juice/soda  Symptoms: None  Follow-up CBG: [id number] CBG Result:76  Possible Reasons for Event: Unknown  Comments/MD notified:Dr. Aileen Fass made aware    [name] [name]

## 2022-02-07 NOTE — Progress Notes (Signed)
Occupational Therapy Treatment Patient Details Name: [name] [name] MRN: [id number] DOB: [birth year] Today's Date: 02/07/2022   History of present illness 61 yo admitted 8/8 after cardiac arrest at home with 35 min CPR and 3 shocks for ROSC. Non displaced sternal fx and bil rib fx 2-7. Intubated 8/8-8/9. PMHx: PAD, Lt AKA, DVT, HLD, HTN, DJD, GERD, lupus, RA, anemia   OT comments  Pt presented in bed at this time and required moderate assist with supine to sitting with increase in time and could not tolerate bed to be level due to finding it difficult to breath. Pt then attempted multiple trials of transfer from bed to chair with and without RW but then completed a squat pivot to the R side from a higher to lower surfaces with max assist. Pt did not have prothesis present and requested to call friend to bring it in as soon as possible. Pt at this time is unsure about SNF but also starting to realize the difficulties with completion of ADLS at home. It is recommended they go to SNF level of care but is they decline they will require max HH services.    Recommendations for follow up therapy are one component of a multi-disciplinary discharge planning process, led by the attending physician.  Recommendations may be updated based on patient status, additional functional criteria and insurance authorization.    Follow Up Recommendations  Skilled nursing-short term rehab (<3 hours/day)    Assistance Recommended at Discharge Frequent or constant Supervision/Assistance  Patient can return home with the following  Two people to help with walking and/or transfers;A lot of help with bathing/dressing/bathroom;Assistance with cooking/housework;Assistance with feeding;Assist for transportation   Equipment Recommendations   (TBD at next site)    Recommendations for Other Services      Precautions / Restrictions Precautions Precautions: Fall Precaution Comments: Lt BKA, has prosthesis but not present and  requested about bringing into hospital Restrictions Weight Bearing Restrictions: Yes Other Position/Activity Restrictions: L AKA w/o prosthetic       Mobility Bed Mobility Overal bed mobility: Needs Assistance Bed Mobility: Supine to Sit Rolling: Mod assist         General bed mobility comments: Pt could not tolerate HOB at level surface as found it to difficult to breath    Transfers Overall transfer level: Needs assistance         Squat pivot transfers: Max assist, From elevated surface       General transfer comment: Pt took multiple attempts to complete with RW and no RW to complete. Pt then felt more secure without RW to complete a squat pivot transfer     Balance Overall balance assessment: Needs assistance Sitting-balance support: No upper extremity supported, Feet supported Sitting balance-Leahy Scale: Fair                                     ADL either performed or assessed with clinical judgement   ADL Overall ADL's : Needs assistance/impaired Eating/Feeding: Set up;Sitting   Grooming: Set up;Sitting   Upper Body Bathing: Set up;Sitting   Lower Body Bathing: Maximal assistance;Cueing for sequencing;Cueing for safety;Sitting/lateral leans   Upper Body Dressing : Set up;Sitting   Lower Body Dressing: Maximal assistance;Cueing for sequencing;Cueing for safety;Sitting/lateral leans   Toilet Transfer: Maximal assistance;Cueing for safety;Cueing for sequencing;BSC/3in1;Squat-pivot;Requires drop arm   Toileting- Clothing Manipulation and Hygiene: Total assistance;Cueing for safety;Cueing for sequencing  Extremity/Trunk Assessment Upper Extremity Assessment Upper Extremity Assessment: RUE deficits/detail;LUE deficits/detail RUE Deficits / Details: R wrist pain due to cath LUE Deficits / Details: limited due to rib fx/chest compressions   Lower Extremity Assessment Lower Extremity Assessment: Defer to PT evaluation         Vision       Perception     Praxis      Cognition Arousal/Alertness: Awake/alert Behavior During Therapy: WFL for tasks assessed/performed Overall Cognitive Status: Impaired/Different from baseline Area of Impairment: Safety/judgement                               General Comments: Pt can not recall events leading up to cardiac arrest and until talking through task urginf t about the need for SNF setting        Exercises      Shoulder Instructions       General Comments      Pertinent Vitals/ Pain       Pain Assessment Pain Assessment: Faces Faces Pain Scale: Hurts even more Facial Expression: Tense Body Movements: Absence of movements Muscle Tension: Relaxed Compliance with ventilator (intubated pts.): N/A Vocalization (extubated pts.): N/A CPOT Total: 1 Pain Location: rib fxs, R wrist Pain Descriptors / Indicators: Aching Pain Intervention(s): Limited activity within patient's tolerance, Monitored during session, Repositioned  Home Living                                          Prior Functioning/Environment              Frequency  Min 2X/week        Progress Toward Goals  OT Goals(current goals can now be found in the care plan section)  Progress towards OT goals: Progressing toward goals  Acute Rehab OT Goals Patient Stated Goal: to be able to sit up more OT Goal Formulation: With patient Time For Goal Achievement: 02/17/22 Potential to Achieve Goals: Good ADL Goals Pt Will Perform Lower Body Bathing: with modified independence;sitting/lateral leans Pt Will Transfer to Toilet: with modified independence;squat pivot transfer;stand pivot transfer;bedside commode Pt Will Perform Toileting - Clothing Manipulation and hygiene: with modified independence;sitting/lateral leans;sit to/from stand  Plan Discharge plan needs to be updated    Co-evaluation                 AM-PAC OT "6 Clicks" Daily  Activity     Outcome Measure   Help from another person eating meals?: None Help from another person taking care of personal grooming?: A Little Help from another person toileting, which includes using toliet, bedpan, or urinal?: Total Help from another person bathing (including washing, rinsing, drying)?: A Lot Help from another person to put on and taking off regular upper body clothing?: A Little Help from another person to put on and taking off regular lower body clothing?: A Lot 6 Click Score: 15    End of Session Equipment Utilized During Treatment: Gait belt  OT Visit Diagnosis: Other abnormalities of gait and mobility (R26.89)   Activity Tolerance Patient limited by fatigue   Patient Left in chair;with call bell/phone within reach;with chair alarm set   Nurse Communication Mobility status        Time: 7169-6789 OT Time Calculation (min): 47 min  Charges: OT General Charges $OT Visit: 1 Visit OT Treatments $Self  Care/Home Management : 38-52 mins  Joeseph Amor OTR/L  Acute Rehab Services  [phone] office number [phone] pager number   Joeseph Amor 02/07/2022, 11:36 AM

## 2022-02-07 NOTE — Progress Notes (Signed)
Physical Therapy Treatment Patient Details Name: [name] [name] MRN: [id number] DOB: 09-14-1960 Today's Date: 02/07/2022   History of Present Illness 61 yo admitted 8/8 after cardiac arrest at home with 35 min CPR and 3 shocks for ROSC. Non displaced sternal fx and bil rib fx 2-7. Intubated 8/8-8/9. PMHx: PAD, Lt AKA, DVT, HLD, HTN, DJD, GERD, lupus, RA, anemia    PT Comments    Pt received in chair with friend present who has brought his prosthesis. Pt's O2 sats 96% on RA at rest. Pt becomes SOB with attempt to put on prosthesis and needed assist from friend and PT to complete task and then needed a rest break from the effort with request to place 2L O2 back on. Pt needed mod A +2 for power up with sit to stand and could maintain only 10-15 secs at a time. SPO2 dropped to 87% on RA with standing. Returned to mid 90's on 2L O2. Pt unable to lock prosthesis in standing like he usually does to ambulate so could not take steps. Do not feel pt independent enough to return home with +1 assist that he had before. Discussed SNF with pt and he was hesitantly agreeable. PT will continue to follow.    Recommendations for follow up therapy are one component of a multi-disciplinary discharge planning process, led by the attending physician.  Recommendations may be updated based on patient status, additional functional criteria and insurance authorization.  Follow Up Recommendations  Skilled nursing-short term rehab (<3 hours/day) Can patient physically be transported by private vehicle: Yes   Assistance Recommended at Discharge Frequent or constant Supervision/Assistance  Patient can return home with the following A little help with bathing/dressing/bathroom;Assistance with cooking/housework;Assist for transportation;Two people to help with walking and/or transfers   Equipment Recommendations  None recommended by PT    Recommendations for Other Services       Precautions / Restrictions  Precautions Precautions: Fall Precaution Comments: L AKA, prosthesis Restrictions Weight Bearing Restrictions: No Other Position/Activity Restrictions: L AKA w/o prosthetic     Mobility  Bed Mobility               General bed mobility comments: pt in chair    Transfers Overall transfer level: Needs assistance Equipment used: Rolling walker (2 wheels) Transfers: Sit to/from Stand Sit to Stand: Mod assist, +2 physical assistance           General transfer comment: pt had great difficulty donning prosthesis and needed assist from his friend and PT. Performed sit>stand 3x needing mod A +2 for power up and 3 mins rest between each bout. SPO2 dropped to 87% on RA with exertion    Ambulation/Gait               General Gait Details: pt attempted to step but was unable. Pt locks L prosthesis for ambulation but could not straighten LLE enough to lock today as well as being unable to control his descent to sitting so unsafe to lock L knee   Stairs             Wheelchair Mobility    Modified Rankin (Stroke Patients Only)       Balance Overall balance assessment: Needs assistance Sitting-balance support: No upper extremity supported, Feet supported Sitting balance-Leahy Scale: Fair Sitting balance - Comments: occasionally loses balance bkwd when fatigued Postural control: Posterior lean Standing balance support: Bilateral upper extremity supported Standing balance-Leahy Scale: Poor Standing balance comment: requires mod A to maintain standing and  can maintain only 15 secs at a time                            Cognition Arousal/Alertness: Awake/alert Behavior During Therapy: WFL for tasks assessed/performed Overall Cognitive Status: Impaired/Different from baseline Area of Impairment: Safety/judgement                 Orientation Level: Disoriented to, Time       Safety/Judgement: Decreased awareness of safety, Decreased awareness of  deficits     General Comments: pt thinks he can mobilize at his normal level until very end of session when he realized how exhausting all mobility would make him and started questioning if he could really go home        Exercises      General Comments General comments (skin integrity, edema, etc.): Pt needed rest breaks after every bout of activity, even seated activity. During rest breaks taught pt IS use and had him practice. Goal set at 1012m. SPO2 dropped to 87% on RA with exertion, returned to mid 90's on 2L O2      Pertinent Vitals/Pain Pain Assessment Pain Assessment: Faces Faces Pain Scale: Hurts even more Pain Location: rib fxs, R wrist Pain Descriptors / Indicators: Aching Pain Intervention(s): Limited activity within patient's tolerance, Monitored during session, Patient requesting pain meds-RN notified    Home Living                          Prior Function            PT Goals (current goals can now be found in the care plan section) Acute Rehab PT Goals Patient Stated Goal: return home PT Goal Formulation: With patient Time For Goal Achievement: 02/16/22 Potential to Achieve Goals: Fair Progress towards PT goals: Progressing toward goals    Frequency    Min 3X/week      PT Plan Discharge plan needs to be updated    Co-evaluation              AM-PAC PT "6 Clicks" Mobility   Outcome Measure  Help needed turning from your back to your side while in a flat bed without using bedrails?: A Lot Help needed moving from lying on your back to sitting on the side of a flat bed without using bedrails?: A Lot Help needed moving to and from a bed to a chair (including a wheelchair)?: A Little Help needed standing up from a chair using your arms (e.g., wheelchair or bedside chair)?: Total Help needed to walk in hospital room?: Total Help needed climbing 3-5 steps with a railing? : Total 6 Click Score: 10    End of Session Equipment Utilized  During Treatment: Gait belt;Oxygen Activity Tolerance: Patient limited by fatigue Patient left: in chair;with call bell/phone within reach;with chair alarm set;with family/visitor present Nurse Communication: Mobility status PT Visit Diagnosis: Other abnormalities of gait and mobility (R26.89);Difficulty in walking, not elsewhere classified (R26.2);Muscle weakness (generalized) (M62.81)     Time: 154[address]-6812PT Time Calculation (min) (ACUTE ONLY): 43 min  Charges:  $Gait Training: 8-22 mins $Therapeutic Activity: 23-37 mins                     VLeighton Roach PT  Acute Rehab Services Secure chat preferred Office [address]8/15/2023, 11:51 AM

## 2022-02-07 NOTE — Discharge Summary (Signed)
Physician Discharge Summary  [name] [name] QBH:[id number] DOB: [birth year] DOA: 01/31/2022  PCP: Angelique Blonder, DO  Admit date: 01/31/2022 Discharge date: 02/08/2022  Admitted From: Home Disposition:  Home  Recommendations for Outpatient Follow-up:  Follow up with PCP in 1-2 weeks Please obtain BMP/CBC in one week   Home Health:Yes Equipment/Devices:None  Discharge Condition:Stable CODE STATUS:Full Diet recommendation: Heart Healthy  Brief/Interim Summary: 61 y.o. male past medical history of peripheral artery disease acutely been ischemia status post left AKA in 2021, partially occlusive external iliac vein and femoral vein, recent right popliteal DVT on DOAC tobacco abuse, hyperlipidemia, essential hypertension who resides in a nursing home most recently seen in consultation in the emergency room for DVT and started on a DOAC EMS complaining of lower extremity pain became unresponsive and pulseless CPR was started by bystanders upon EMS arrival was found in ventricular tachycardia defibrillated x 3 and given amiodarone bolus, 35 minutes till ROSC in the ED twelve-lead EKG showed ST segment elevation, Glascow score of 3, cardiology felt not a candidate for left heart cath given severity of illness       Significant Events: 01/31/2022 s/p cardiac arrest defibrillated x 3 started on a heparin drip, amiodarone bolus given Glascow score 3. 01/31/2022 intubation   Significant studies: CT angio showed acute segmental PE no right heart strain's, multiple rib fractures 2-7 CT of the head showed left subacute on chronic left inferior occipital infarct    Discharge Diagnoses:  Principal Problem:   Cardiac arrest [facility] Active Problems:   Cardiogenic shock ([place])   DVT (deep venous thrombosis) ([place])   STEMI (ST elevation myocardial infarction) ([place])   Pulmonary emboli ([place])   Acute respiratory failure ([place])   Multiple fractures of ribs, left side, initial encounter for closed fracture    Pulmonary contusion   Aspiration pneumonia ([place])   HLD (hyperlipidemia)   Rheumatoid arthritis ([place])   Tobacco use disorder   Coronary artery disease involving native coronary artery of native heart with unstable angina pectoris (HCC)   PAD (peripheral artery disease) (HCC)   Hx of AKA (above knee amputation), left (HCC)   Anoxic brain injury ([place])   Lactic acidosis   Thrombocytopenia (HCC)   Hyperglycemia   Sternal fracture   H/O: stroke   Pressure injury of skin  Cardiac arrest secondary to V-fib/chronic right ventricular failure: Intubated on admission resuscitated requiring pressors. Cardiology was consulted who suspected ischemic etiology and recommended a left heart cath which had to be delayed due to his renal dysfunction. Left heart cath was done that showed showed 30% in the circumflex 100% in the RCA. Cardiology recommended conservative management with Plavix and apixaban. Follow-up with cards as an outpatient.  Acute respiratory failure with hypoxia : Multifactorial in the setting of cardiac arrest, aspiration pneumonia and possibly PE. Start empirically on antibiotics which she completed his course in-house. CT of the chest shows small PE unlikely, he will continue to be on Eliquis.  ATN postcardiac arrest: With a baseline creatinine of 1 peaked at 3 started on aggressive IV fluid hydration his creatinine returned to baseline follow-up with PCP as an outpatient.  Recent DVT and PE/severely dilated RV: Ischemic evaluation evaluated will need to follow-up with cardiology as an outpatient. We will continue Eliquis.  Metabolic encephalopathy: Likely due to cardiac arrest EEG shows severe diffuse encephalopathy nonspecific, no signs of epileptic focus.  History of PVD status post left AKA: [place]ump is clean.  Shock liver: Due to cardiac arrest now resolved with supportive  management.  Hyperglycemia: Likely stress demargination A1c of 5.0, has been counseled on diet  and exercise.   Discharge Instructions  Discharge Instructions     Diet - low sodium heart healthy   Complete by: As directed    Increase activity slowly   Complete by: As directed    No wound care   Complete by: As directed       Allergies as of 02/08/2022       Reactions   Dilaudid [hydromorphone Hcl] Rash   Ramipril Rash, Other (See Comments)   Per patient on R arm and leg only; no angioedema   Tramadol Itching, Rash        Medication List     STOP taking these medications    doxycycline 100 MG capsule Commonly known as: VIBRAMYCIN   ibuprofen 200 MG tablet Commonly known as: ADVIL       TAKE these medications    amLODipine 10 MG tablet Commonly known as: NORVASC TAKE 1 TABLET BY MOUTH EVERY DAY   Apixaban Starter Pack (49m and 521m Commonly known as: ELIQUIS STARTER PACK Take as directed on package: start with two-61m14mablets twice daily for 7 days. On day 8, switch to one-61mg661mblet twice daily.   atorvastatin 80 MG tablet Commonly known as: LIPITOR Take 1 tablet (80 mg total) by mouth daily at 6 PM.   chlorthalidone 25 MG tablet Commonly known as: HYGROTON TAKE 1 TABLET BY MOUTH EVERY DAY   clopidogrel 75 MG tablet Commonly known as: Plavix Take 1 tablet (75 mg total) by mouth daily.   diclofenac Sodium 1 % Gel Commonly known as: VOLTAREN Apply 2-4 g topically in the morning, at noon, in the evening, and at bedtime.   docusate sodium 100 MG capsule Commonly known as: COLACE Take 1 capsule (100 mg total) by mouth daily.   DULoxetine 30 MG capsule Commonly known as: CYMBALTA Take 30 mg by mouth daily.   furosemide 40 MG tablet Commonly known as: Lasix Take 1 tablet (40 mg total) by mouth every other day for 3 doses.   gabapentin 100 MG capsule Commonly known as: NEURONTIN Take 100 mg by mouth at bedtime as needed (for pain).   gabapentin 300 MG capsule Commonly known as: NEURONTIN Take 300 mg by mouth 2 (two) times daily.    lidocaine 5 % Commonly known as: LIDODERM Place 1 patch onto the skin daily. Remove & Discard patch within 12 hours or as directed by MD For chest wall pain Start taking on: February 09, 2022   metoprolol tartrate 25 MG tablet Commonly known as: LOPRESSOR Take 0.5 tablets (12.5 mg total) by mouth 2 (two) times daily.   naloxone 2 MG/2ML injection Commonly known as: NARCAN Inject 2 mg into the muscle once as needed (opiod overdose (call 911, inject intramuscularly in shoulder or thigh. Repeat every 3 minutes)).   naloxone 4 MG/0.1ML Liqd nasal spray kit Commonly known as: NARCAN Place 1 spray into the nose as needed.   oxyCODONE-acetaminophen 10-325 MG tablet Commonly known as: PERCOCET Take 1 tablet by mouth every 6 (six) hours as needed for severe pain.   varenicline 1 MG tablet Commonly known as: CHANTIX TAKE 1 TABLET BY MOUTH TWICE A DAY         Follow-up Information     [place], [place] [facility].   Specialty: Home Health Services Why: Known as [facility]-office to call with visit times. Contact information: [address] [place] [address] [phone]                Allergies  Allergen Reactions   Dilaudid [Hydromorphone Hcl] Rash   Ramipril Rash and Other (See Comments)    Per patient on R arm and leg only; no angioedema   Tramadol Itching and Rash    Consultations: PCCM Cardiology   Procedures/Studies: CARDIAC CATHETERIZATION  Result Date: 02/06/2022   Dist Cx lesion is 30% stenosed.   Prox RCA lesion is 100% stenosed.   LV end diastolic pressure is normal. Single vessel occlusive CAD. Occlusion of proximal nondominant RCA which is new since 2018. Normal LVEDP Plan: medical therapy   ECHOCARDIOGRAM LIMITED  Result Date: 02/01/2022    ECHOCARDIOGRAM LIMITED REPORT   Patient Name:   ASHAAD GAERTNER Date of Exam: 02/01/2022 Medical Rec #:  [id number]     Height:       68.0 in  Accession #:    [id number]    Weight:       162.7 lb Date of Birth:  08-04-1960    BSA:          1.872 m Patient Age:    61 years      BP:           110/87 mmHg Patient Gender: M             HR:           110 bpm. Exam Location:  Inpatient Procedure: Limited Echo, Cardiac Doppler, Color Doppler and Intracardiac            Opacification Agent Indications:    Cardiac arrest  History:        Patient has prior history of Echocardiogram examinations, most                 recent 01/31/2022. PAD; Risk Factors:Hypertension and Current                 Smoker.  Sonographer:    Maudry Mayhew MHA, RDMS, RVT, RDCS Referring Phys: [id number] Candee Furbish  Sonographer Comments: Image acquisition challenging due to respiratory motion. IMPRESSIONS  1. Left ventricular ejection fraction, by estimation, is 60 to 65%. The left ventricle has normal function. Left ventricular diastolic parameters are indeterminate. There is the interventricular septum is flattened in systole, consistent with right ventricular pressure overload.  2. The Right ventricle is severly dilated with hypokinesis in the basal and mid portion but hyperkinesis in the apical portion - consistent with McConnell's sign which was also present in the TTE performed 01/31/22. Right ventricular systolic function is moderately reduced. The right ventricular size is severely enlarged. There is normal pulmonary artery systolic pressure.  3. The mitral valve is grossly normal. No evidence of mitral valve regurgitation. No evidence of mitral stenosis.  4. The aortic valve is grossly normal. Aortic valve regurgitation is trivial. Aortic valve sclerosis is present, with no evidence of aortic valve stenosis.  5. The inferior vena cava is normal in size with greater than 50% respiratory variability, suggesting right atrial pressure of 3 mmHg. FINDINGS  Left Ventricle: Left ventricular ejection fraction, by estimation, is 60 to 65%. The left ventricle has normal function. Definity  contrast agent was given IV to delineate the left ventricular endocardial borders. The left ventricular internal cavity size was small. The interventricular septum is flattened in systole, consistent with right ventricular pressure overload. Left ventricular diastolic parameters are indeterminate.  LV Wall Scoring: The apical lateral  segment, apical anterior segment, apical inferior segment, and apex are akinetic. The anterior wall, antero-lateral wall, mid and distal anterior septum, inferior septum, inferior wall, and mid inferolateral segment are normal. Right Ventricle: The Right ventricle is severly dilated with hypokinesis in the basal and mid portion but hyperkinesis in the apical portion - consistent with McConnell's sign which was also present in the TTE performed 01/31/22. The right ventricular size  is severely enlarged. Right ventricular systolic function is moderately reduced. There is normal pulmonary artery systolic pressure. The tricuspid regurgitant velocity is 2.43 m/s, and with an assumed right atrial pressure of 3 mmHg, the estimated right  ventricular systolic pressure is 67.8 mmHg. Left Atrium: Left atrial size was not assessed. Right Atrium: Right atrial size was not assessed. Pericardium: Presence of epicardial fat layer. Mitral Valve: The mitral valve is grossly normal. No evidence of mitral valve stenosis. Tricuspid Valve: The tricuspid valve is grossly normal. Tricuspid valve regurgitation is not demonstrated. No evidence of tricuspid stenosis. Aortic Valve: The aortic valve is grossly normal. Aortic valve regurgitation is trivial. Aortic valve sclerosis is present, with no evidence of aortic valve stenosis. Pulmonic Valve: The pulmonic valve was not well visualized. Aorta: The aortic root is normal in size and structure and the ascending aorta was not well visualized. Venous: The inferior vena cava is normal in size with greater than 50% respiratory variability, suggesting right atrial  pressure of 3 mmHg. LEFT VENTRICLE PLAX 2D LVIDd:         4.70 cm Diastology LVIDs:         3.00 cm LV e' medial:    12.20 cm/s LV PW:         1.10 cm LV E/e' medial:  4.0 LV IVS:        0.90 cm LV e' lateral:   10.70 cm/s                        LV E/e' lateral: 4.6  RIGHT VENTRICLE RV S prime:     9.14 cm/s LEFT ATRIUM         Index LA diam:    3.00 cm 1.60 cm/m   AORTA Ao Root diam: 3.60 cm MITRAL VALVE               TRICUSPID VALVE MV Area (PHT): 3.60 cm    TR Peak grad:   23.6 mmHg MV Decel Time: 211 msec    TR Vmax:        243.00 cm/s MV E velocity: 49.30 cm/s MV A velocity: 71.30 cm/s MV E/A ratio:  0.69 Kardie Tobb DO Electronically signed by Berniece Salines DO Signature Date/Time: 02/01/2022/11:59:14 AM    Final    EEG adult  Result Date: 02/01/2022 Lora Havens, MD     02/01/2022  8:34 AM Patient Name: JAHREL BORTHWICK MRN: [id number] Epilepsy Attending: Lora Havens Referring Physician/Provider: Erick Colace, NP Date: 01/31/2022 Duration: 30.34 mins Patient history: 61 year old male status postcardiac arrest.  EEG to evaluate for seizure. Level of alertness: comatose AEDs during EEG study: Propofol Technical aspects: This EEG study was done with scalp electrodes positioned according to the 10-20 International system of electrode placement. Electrical activity was reviewed with band pass filter of 1-70Hz , sensitivity of 7 uV/mm, display speed of 72m/sec with a 60Hz  notched filter applied as appropriate. EEG data were recorded continuously and digitally stored.  Video monitoring was available and reviewed as appropriate. Description: EEG showed continuous  generalized 2 to 3 Hz With overriding drop to 40 Hz beta activity.  Intermittent episodes of subtle head jerks were noted without concomitant EEG change.  Hyperventilation and photic stimulation were not performed.   ABNORMALITY - Continuous slow, generalized IMPRESSION: This study is suggestive of severe diffuse encephalopathy, nonspecific etiology.   Intermittent episodes of head jerk were noted without concomitant EEG change or most likely not epileptic.  No seizures or epileptiform discharges were seen throughout the recording. Lora Havens   DG CHEST PORT 1 VIEW  Result Date: 02/01/2022 CLINICAL DATA:  61 year old male central line placement. Pulmonary emboli. EXAM: PORTABLE CHEST 1 VIEW COMPARISON:  Portable chest 01/31/2022 and earlier. FINDINGS: Portable AP semi upright view at 0622 hours. Endotracheal tube and visible enteric tube appears stable. New left IJ approach central line in place. Catheter tip at or just below the carina (SVC level. No pneumothorax. Mediastinal contours are stable and within normal limits. Allowing for portable technique the lungs are now clear, and bilateral ventilation appears substantially improved from the chest CTA yesterday. IMPRESSION: 1. New left IJ approach central line with tip at the SVC level, no adverse features. 2. Otherwise stable lines and tubes. 3. Improved bilateral ventilation since the CTA yesterday. No new pulmonary abnormality. Electronically Signed   By: Genevie Ann M.D.   On: 02/01/2022 06:33   [place] EKG SITE RITE  Result Date: 01/31/2022 If Site Rite image not attached, placement could not be confirmed due to current cardiac rhythm.  DG CHEST PORT 1 VIEW  Result Date: 01/31/2022 CLINICAL DATA:  Pulmonary embolus, intubated EXAM: PORTABLE CHEST 1 VIEW COMPARISON:  823 FINDINGS: Single frontal view of the chest demonstrates endotracheal tube overlying tracheal air column tip well above carina. Enteric catheter passes below diaphragm tip excluded by collimation. The cardiac silhouette is stable. Improved aeration of the left lung since prior study. No focal consolidation, effusion, or pneumothorax. No acute bony abnormality. IMPRESSION: 1. Support devices as above. 2. Improved aeration of the lungs, with resolution of the left-sided airspace disease seen previously. Electronically Signed   By: Randa Ngo M.D.   On: 01/31/2022 22:13   ECHOCARDIOGRAM COMPLETE  Result Date: 01/31/2022    ECHOCARDIOGRAM REPORT   Patient Name:   KAIDAN HARPSTER Date of Exam: 01/31/2022 Medical Rec #:  [id number]     Height:       68.0 in Accession #:    [id number]    Weight:       178.0 lb Date of Birth:  [birth year]    BSA:          1.945 m Patient Age:    79 years      BP:           130/94 mmHg Patient Gender: M             HR:           100 bpm. Exam Location:  Inpatient Procedure: 2D Echo, Cardiac Doppler and Color Doppler Indications:    Abnormal ECG R94.31  History:        Patient has prior history of Echocardiogram examinations, most                 recent 02/22/2019. PVD, AV Mass, Signs/Symptoms:Murmur; Risk                 Factors:Hypertension. Lupus, Anemia.  Sonographer:    Eartha Inch Referring Phys: [place] Comments: Echo performed with  patient supine and on artificial respirator. Image acquisition challenging due to patient body habitus and Image acquisition challenging due to respiratory motion. IMPRESSIONS  1. Left ventricular ejection fraction, by estimation, is 60 to 65%. The left ventricle has normal function. The left ventricle has no regional wall motion abnormalities. Indeterminate diastolic filling due to E-A fusion. There is the interventricular septum is flattened in systole, consistent with right ventricular pressure overload.  2. Right ventricular systolic function is severely reduced. The right ventricular size is mildly enlarged. Tricuspid regurgitation signal is inadequate for assessing PA pressure.  3. The mitral valve is normal in structure. No evidence of mitral valve regurgitation.  4. The aortic valve was not well visualized. Aortic valve regurgitation is not visualized. Aortic valve sclerosis is present, with no evidence of aortic valve stenosis. Comparison(s): Prior images reviewed side by side. The right ventricular hypertrophy is significantly worse.  Conclusion(s)/Recommendation(s): Discussed findings with primary team. FINDINGS  Left Ventricle: Left ventricular ejection fraction, by estimation, is 60 to 65%. The left ventricle has normal function. The left ventricle has no regional wall motion abnormalities. The left ventricular internal cavity size was normal in size. There is  no left ventricular hypertrophy. The interventricular septum is flattened in systole, consistent with right ventricular pressure overload. Indeterminate diastolic filling due to E-A fusion. Right Ventricle: McConnell's sign is present, strongly correlated with acute cor pulmonale, most likely large pulmonary embolism. The right ventricular size is mildly enlarged. No increase in right ventricular wall thickness. Right ventricular systolic function is severely reduced. Tricuspid regurgitation signal is inadequate for assessing PA pressure. Left Atrium: Left atrial size was normal in size. Right Atrium: Right atrial size was normal in size. Pericardium: There is no evidence of pericardial effusion. Mitral Valve: The mitral valve is normal in structure. No evidence of mitral valve regurgitation. Tricuspid Valve: The tricuspid valve is normal in structure. Tricuspid valve regurgitation is not demonstrated. Aortic Valve: The aortic valve was not well visualized. Aortic valve regurgitation is not visualized. Aortic valve sclerosis is present, with no evidence of aortic valve stenosis. Pulmonic Valve: The pulmonic valve was not well visualized. Aorta: The aortic root is normal in size and structure. Venous: IVC assessment for right atrial pressure unable to be performed due to mechanical ventilation. IAS/Shunts: No atrial level shunt detected by color flow Doppler. Sanda Klein MD Electronically signed by Sanda Klein MD Signature Date/Time: 01/31/2022/4:17:16 PM    Final    CT Angio Chest PE W and/or Wo Contrast  Result Date: 01/31/2022 CLINICAL DATA:  Pulmonary embolism (PE) suspected,  high prob EXAM: CT ANGIOGRAPHY CHEST WITH CONTRAST TECHNIQUE: Multidetector CT imaging of the chest was performed using the standard protocol during bolus administration of intravenous contrast. Multiplanar CT image reconstructions and MIPs were obtained to evaluate the vascular anatomy. RADIATION DOSE REDUCTION: This exam was performed according to the departmental dose-optimization program which includes automated exposure control, adjustment of the mA and/or kV according to patient size and/or use of iterative reconstruction technique. CONTRAST:  140m OMNIPAQUE IOHEXOL 350 MG/ML SOLN COMPARISON:  CTA 06/26/2020. FINDINGS: Cardiovascular: Satisfactory opacification of the pulmonary arteries to the segmental level. There is no acute pulmonary embolism at the branching of the left main pulmonary artery into the segmental lower lobe arteries (series 5, images 65-67, series 8, image [id number]). Additional small filling defect and a right upper lobe segmental artery (series 5, image 57). No central/saddle PE.Cardiomegaly. RV: LV ratio is 0.9. Trace pericardial effusion. Atherosclerosis of the thoracic aorta. Mediastinum/Nodes: No  lymphadenopathy. The thyroid is unremarkable. Esophagus is unremarkable. Lungs/Pleura: There are confluent ground-glass opacities in the upper lungs bilaterally with areas of nodular consolidation and interlobular septal thickening. Bibasilar atelectasis. No pleural effusion. No pneumothorax. Mid to Upper Abdomen: No acute abnormality. Musculoskeletal: There are multiple acute anterior rib fractures, involving ribs 2 through 7 bilaterally. Most of these are angulated and nondisplaced. Fractures involving ribs 3 through 5 on the right are mildly displaced. Nondisplaced mid sternal fracture. Review of the MIP images confirms the above findings. IMPRESSION: Acute segmental pulmonary emboli in the left lower and right upper lobes. No definite CT evidence of right heart strain. Complex ground-glass  opacities in the mid to upper lungs bilaterally with areas of nodular consolidation and interlobular septal thickening. Findings are most consistent with contusions related to recent cardiopulmonary resuscitation, versus a multifocal infectious/inflammatory process. Bibasilar atelectasis. Multiple anterior rib fractures involving ribs 2 through 7 bilaterally. Nondisplaced mid sternal fracture. Critical Value/emergent results were called by telephone at the time of interpretation on 01/31/2022 at 2:29 pm to provider Sherwood Gambler , who verbally acknowledged these results. Electronically Signed   By: Maurine Simmering M.D.   On: 01/31/2022 14:36   CT Head Wo Contrast  Result Date: 01/31/2022 CLINICAL DATA:  Neuro deficit, acute, stroke suspected EXAM: CT HEAD WITHOUT CONTRAST TECHNIQUE: Contiguous axial images were obtained from the base of the skull through the vertex without intravenous contrast. RADIATION DOSE REDUCTION: This exam was performed according to the departmental dose-optimization program which includes automated exposure control, adjustment of the mA and/or kV according to patient size and/or use of iterative reconstruction technique. COMPARISON:  12/08/2015 FINDINGS: Brain: Late subacute to chronic appearing infarct within the inferior left occipital lobe. Otherwise, no evidence of acute infarction. No hemorrhage, hydrocephalus, extra-axial collection, or mass lesion. Scattered low-density changes within the periventricular and subcortical white matter compatible with chronic microvascular ischemic change. Vascular: Atherosclerotic calcifications involving the large vessels of the skull base. No unexpected hyperdense vessel. Skull: Normal. Negative for fracture or focal lesion. Sinuses/Orbits: Small retention cyst or polyp in the right maxillary sinus. Other: None. IMPRESSION: 1. Late subacute to chronic appearing infarct within the inferior left occipital lobe. Follow-up MRI can be performed to more  accurately characterize. 2. Otherwise, no acute intracranial abnormality. Electronically Signed   By: Davina Poke D.O.   On: 01/31/2022 14:25   DG Chest Port 1 View  Result Date: 01/31/2022 CLINICAL DATA:  Post CPR EXAM: PORTABLE CHEST 1 VIEW COMPARISON:  Chest x-ray 06/26/2020 FINDINGS: Endotracheal tube in good position. NG tube in the stomach with the tip at the GE junction. Atherosclerotic calcification aortic arch. Right lung is clear. Mild left upper lobe and left lower lobe airspace disease. No left effusion. No acute skeletal abnormality IMPRESSION: Endotracheal tube in good position. NG tip in this stomach with the side hole in the distal esophagus. Mild airspace disease left upper lobe and left lower lobe. Possible atelectasis or aspiration. Electronically Signed   By: Franchot Gallo M.D.   On: 01/31/2022 12:50   VAS [place] LOWER EXTREMITY VENOUS (DVT) (7a-7p)  Result Date: 01/10/2022  Lower Venous DVT Study Patient Name:  LATRAVIOUS LEVITT  Date of Exam:   01/10/2022 Medical Rec #: [id number]      Accession #:    [id number] Date of Birth: 1960-12-29     Patient Gender: M Patient Age:   56 years Exam Location:  [facility] Procedure:      VAS [place] LOWER EXTREMITY VENOUS (  DVT) Referring Phys: [facility] SMOOT --------------------------------------------------------------------------------  Indications: Pain, and Swelling.  Comparison Study: No previous exam noted. Performing Technologist: Bobetta Lime BS, RVT  Examination Guidelines: A complete evaluation includes B-mode imaging, spectral Doppler, color Doppler, and power Doppler as needed of all accessible portions of each vessel. Bilateral testing is considered an integral part of a complete examination. Limited examinations for reoccurring indications may be performed as noted. The reflux portion of the exam is performed with the patient in reverse Trendelenburg.  +---------+---------------+---------+-----------+----------+--------------+ RIGHT     CompressibilityPhasicitySpontaneityPropertiesThrombus Aging +---------+---------------+---------+-----------+----------+--------------+ CFV      Full           Yes      Yes                                 +---------+---------------+---------+-----------+----------+--------------+ SFJ      Full                                                        +---------+---------------+---------+-----------+----------+--------------+ FV Prox  Full                                                        +---------+---------------+---------+-----------+----------+--------------+ FV Mid   Full                                                        +---------+---------------+---------+-----------+----------+--------------+ FV DistalFull                                                        +---------+---------------+---------+-----------+----------+--------------+ PFV      Full                                                        +---------+---------------+---------+-----------+----------+--------------+ POP      None           No       No                                  +---------+---------------+---------+-----------+----------+--------------+ PTV      None           No       No                                  +---------+---------------+---------+-----------+----------+--------------+ PERO     None           No       No                                  +---------+---------------+---------+-----------+----------+--------------+  SSV      None                                                        +---------+---------------+---------+-----------+----------+--------------+   +----+---------------+---------+-----------+----------+--------------+ LEFTCompressibilityPhasicitySpontaneityPropertiesThrombus Aging +----+---------------+---------+-----------+----------+--------------+ CFV Partial        Yes      Yes                                  +----+---------------+---------+-----------+----------+--------------+ EIV Partial        Yes      Yes                                 +----+---------------+---------+-----------+----------+--------------+   Left Technical Findings: Left AKA at the proximal thigh.   Summary: RIGHT: - Findings consistent with acute deep vein thrombosis involving the right popliteal vein, right peroneal veins, and right posterior tibial veins. - Findings consistent with acute superficial vein thrombosis involving the right small saphenous vein, and right great saphenous vein. - No cystic structure found in the popliteal fossa.  LEFT: - Findings consistent with acute deep vein thrombosis involving the left common femoral vein, and EIV. - The IVC appears patent.  *See table(s) above for measurements and observations. Electronically signed by Deitra Mayo MD on 01/10/2022 at 6:28:47 PM.    Final      Subjective: No complaints  Discharge Exam: Vitals:   02/08/22 1259 02/08/22 1432  BP: (!) 156/102   Pulse: 96 (!) 108  Resp: (!) 26   Temp: 97.7 F (36.5 C)   SpO2:     Vitals:   02/08/22 0858 02/08/22 1258 02/08/22 1259 02/08/22 1432  BP: (!) 171/95 (!) 156/102 (!) 156/102   Pulse: 93 96 96 (!) 108  Resp:  (!) 26 (!) 26   Temp:  97.7 F (36.5 C) 97.7 F (36.5 C)   TempSrc:  Oral    SpO2:  96%    Weight:      Height:        General: Pt is alert, awake, not in acute distress Cardiovascular: RRR, S1/S2 +, no rubs, no gallops Respiratory: CTA bilaterally, no wheezing, no rhonchi Abdominal: Soft, NT, ND, bowel sounds + Extremities: no edema, no cyanosis    The results of significant diagnostics from this hospitalization (including imaging, microbiology, ancillary and laboratory) are listed below for reference.     Microbiology: Recent Results (from the past 240 hour(s))  Resp Panel by RT-PCR (Flu A&B, Covid) Anterior Nasal Swab     Status: None   Collection Time: 01/31/22 12:36 PM    Specimen: Anterior Nasal Swab  Result Value Ref Range Status   SARS Coronavirus 2 by RT PCR NEGATIVE NEGATIVE Final    Comment: (NOTE) SARS-CoV-2 target nucleic acids are NOT DETECTED.  The SARS-CoV-2 RNA is generally detectable in upper respiratory specimens during the acute phase of infection. The lowest concentration of SARS-CoV-2 viral copies this assay can detect is 138 copies/mL. A negative result does not preclude SARS-Cov-2 infection and should not be used as the sole basis for treatment or other patient management decisions. A negative result may occur with  improper specimen collection/handling, submission of specimen other than nasopharyngeal swab,  presence of viral mutation(s) within the areas targeted by this assay, and inadequate number of viral copies(<138 copies/mL). A negative result must be combined with clinical observations, patient history, and epidemiological information. The expected result is Negative.  Fact Sheet for Patients:  EntrepreneurPulse.com.au  Fact Sheet for Healthcare Providers:  IncredibleEmployment.be  This test is no t yet approved or cleared by the [place] FDA and  has been authorized for detection and/or diagnosis of SARS-CoV-2 by FDA under an Emergency Use Authorization (EUA). This EUA will remain  in effect (meaning this test can be used) for the duration of the COVID-19 declaration under Section 564(b)(1) of the Act, 21 U.S.C.section 360bbb-3(b)(1), unless the authorization is terminated  or revoked sooner.       Influenza A by PCR NEGATIVE NEGATIVE Final   Influenza B by PCR NEGATIVE NEGATIVE Final    Comment: (NOTE) The Xpert Xpress SARS-CoV-2/FLU/RSV plus assay is intended as an aid in the diagnosis of influenza from Nasopharyngeal swab specimens and should not be used as a sole basis for treatment. Nasal washings and aspirates are unacceptable for Xpert Xpress  SARS-CoV-2/FLU/RSV testing.  Fact Sheet for Patients: EntrepreneurPulse.com.au  Fact Sheet for Healthcare Providers: IncredibleEmployment.be  This test is not yet approved or cleared by the [place] FDA and has been authorized for detection and/or diagnosis of SARS-CoV-2 by FDA under an Emergency Use Authorization (EUA). This EUA will remain in effect (meaning this test can be used) for the duration of the COVID-19 declaration under Section 564(b)(1) of the Act, 21 U.S.C. section 360bbb-3(b)(1), unless the authorization is terminated or revoked.  Performed at [facility], [place] [address] [place], [place] [address]   Culture, blood (routine x 2)     Status: None   Collection Time: 01/31/22 12:42 PM   Specimen: BLOOD  Result Value Ref Range Status   Specimen Description BLOOD BLOOD RIGHT HAND  Final   Special Requests   Final    BOTTLES DRAWN AEROBIC AND ANAEROBIC Blood Culture results may not be optimal due to an inadequate volume of blood received in culture bottles   Culture   Final    NO GROWTH 5 DAYS Performed at [facility], [place] [address] [place], [place] [address]    Report Status 02/05/2022 FINAL  Final  Culture, blood (routine x 2)     Status: None   Collection Time: 01/31/22  6:48 PM   Specimen: BLOOD RIGHT HAND  Result Value Ref Range Status   Specimen Description BLOOD RIGHT HAND  Final   Special Requests   Final    BOTTLES DRAWN AEROBIC AND ANAEROBIC Blood Culture results may not be optimal due to an inadequate volume of blood received in culture bottles   Culture   Final    NO GROWTH 5 DAYS Performed at [facility], [place] [address], [place], [place] [address]    Report Status 02/05/2022 FINAL  Final     Labs: BNP (last 3 results) No results for input(s): "BNP" in the last 8760 hours. Basic Metabolic Panel: Recent Labs  Lab 02/01/22 1625 02/02/22 0101 02/02/22 0115 02/03/22 0327  02/04/22 0036 02/05/22 0208 02/06/22 0736 02/07/22 0322  NA 137   < >  --  137 136 137 138 138  K 3.5   < >  --  4.2 4.3 3.8 3.4* 4.1  CL 101   < >  --  105 103 104 106 107  CO2 24   < >  --  22 21* 21* 21* 22  GLUCOSE 127*   < >  --  117* 90 91 105* 80  BUN 32*   < >  --  39* 44* 37* 30* 27*  CREATININE 2.33*   < >  --  2.36* 2.17* 1.86* 1.43* 1.29*  CALCIUM 7.2*   < >  --  7.5* 7.5* 7.8* 7.8* 8.1*  MG 2.0  --  2.2 2.1  --   --   --   --    < > = values in this interval not displayed.   Liver Function Tests: No results for input(s): "AST", "ALT", "ALKPHOS", "BILITOT", "PROT", "ALBUMIN" in the last 168 hours.  No results for input(s): "LIPASE", "AMYLASE" in the last 168 hours. No results for input(s): "AMMONIA" in the last 168 hours. CBC: Recent Labs  Lab 02/04/22 0036 02/05/22 0208 02/06/22 0736 02/07/22 0322 02/08/22 0616  WBC 9.6 11.2* 12.6* 14.6* 13.6*  HGB 10.1* 10.0* 9.6* 10.0* 10.4*  HCT 31.3* 31.3* 30.5* 31.8* 33.0*  MCV 80.1 80.7 81.1 81.5 81.5  PLT 196 219 238 283 337   Cardiac Enzymes: No results for input(s): "CKTOTAL", "CKMB", "CKMBINDEX", "TROPONINI" in the last 168 hours. BNP: Invalid input(s): "POCBNP" CBG: Recent Labs  Lab 02/07/22 1707 02/07/22 2124 02/08/22 0803 02/08/22 0859 02/08/22 1131  GLUCAP 81 85 58* 74 79   D-Dimer No results for input(s): "DDIMER" in the last 72 hours. Hgb A1c No results for input(s): "HGBA1C" in the last 72 hours. Lipid Profile No results for input(s): "CHOL", "HDL", "LDLCALC", "TRIG", "CHOLHDL", "LDLDIRECT" in the last 72 hours. Thyroid function studies No results for input(s): "TSH", "T4TOTAL", "T3FREE", "THYROIDAB" in the last 72 hours.  Invalid input(s): "FREET3" Anemia work up No results for input(s): "VITAMINB12", "FOLATE", "FERRITIN", "TIBC", "IRON", "RETICCTPCT" in the last 72 hours. Urinalysis    Component Value Date/Time   COLORURINE YELLOW 06/17/2018 1434   APPEARANCEUR CLEAR 06/17/2018 1434    LABSPEC 1.025 06/17/2018 1434   PHURINE 5.5 06/17/2018 1434   GLUCOSEU NEGATIVE 06/17/2018 1434   HGBUR NEGATIVE 06/17/2018 1434   BILIRUBINUR NEGATIVE 06/17/2018 1434   KETONESUR NEGATIVE 06/17/2018 1434   PROTEINUR NEGATIVE 06/17/2018 1434   UROBILINOGEN 0.2 11/19/2011 1740   NITRITE NEGATIVE 06/17/2018 1434   LEUKOCYTESUR NEGATIVE 06/17/2018 1434   Sepsis Labs Recent Labs  Lab 02/05/22 0208 02/06/22 0736 02/07/22 0322 02/08/22 0616  WBC 11.2* 12.6* 14.6* 13.6*   Microbiology Recent Results (from the past 240 hour(s))  Resp Panel by RT-PCR (Flu A&B, Covid) Anterior Nasal Swab     Status: None   Collection Time: 01/31/22 12:36 PM   Specimen: Anterior Nasal Swab  Result Value Ref Range Status   SARS Coronavirus 2 by RT PCR NEGATIVE NEGATIVE Final    Comment: (NOTE) SARS-CoV-2 target nucleic acids are NOT DETECTED.  The SARS-CoV-2 RNA is generally detectable in upper respiratory specimens during the acute phase of infection. The lowest concentration of SARS-CoV-2 viral copies this assay can detect is 138 copies/mL. A negative result does not preclude SARS-Cov-2 infection and should not be used as the sole basis for treatment or other patient management decisions. A negative result may occur with  improper specimen collection/handling, submission of specimen other than nasopharyngeal swab, presence of viral mutation(s) within the areas targeted by this assay, and inadequate number of viral copies(<138 copies/mL). A negative result must be combined with clinical observations, patient history, and epidemiological information. The expected result is Negative.  Fact Sheet for Patients:  EntrepreneurPulse.com.au  Fact  Sheet for Healthcare Providers:  IncredibleEmployment.be  This test is no t yet approved or cleared by the [place] FDA and  has been authorized for detection and/or diagnosis of SARS-CoV-2 by FDA under an Emergency Use  Authorization (EUA). This EUA will remain  in effect (meaning this test can be used) for the duration of the COVID-19 declaration under Section 564(b)(1) of the Act, 21 U.S.C.section 360bbb-3(b)(1), unless the authorization is terminated  or revoked sooner.       Influenza A by PCR NEGATIVE NEGATIVE Final   Influenza B by PCR NEGATIVE NEGATIVE Final    Comment: (NOTE) The Xpert Xpress SARS-CoV-2/FLU/RSV plus assay is intended as an aid in the diagnosis of influenza from Nasopharyngeal swab specimens and should not be used as a sole basis for treatment. Nasal washings and aspirates are unacceptable for Xpert Xpress SARS-CoV-2/FLU/RSV testing.  Fact Sheet for Patients: EntrepreneurPulse.com.au  Fact Sheet for Healthcare Providers: IncredibleEmployment.be  This test is not yet approved or cleared by the [place] FDA and has been authorized for detection and/or diagnosis of SARS-CoV-2 by FDA under an Emergency Use Authorization (EUA). This EUA will remain in effect (meaning this test can be used) for the duration of the COVID-19 declaration under Section 564(b)(1) of the Act, 21 U.S.C. section 360bbb-3(b)(1), unless the authorization is terminated or revoked.  Performed at [facility], [place] [address] [place], [place] [address]   Culture, blood (routine x 2)     Status: None   Collection Time: 01/31/22 12:42 PM   Specimen: BLOOD  Result Value Ref Range Status   Specimen Description BLOOD BLOOD RIGHT HAND  Final   Special Requests   Final    BOTTLES DRAWN AEROBIC AND ANAEROBIC Blood Culture results may not be optimal due to an inadequate volume of blood received in culture bottles   Culture   Final    NO GROWTH 5 DAYS Performed at [facility], [place] [address] [place], [place] [address]    Report Status 02/05/2022 FINAL  Final  Culture, blood (routine x 2)     Status: None   Collection Time: 01/31/22  6:48 PM   Specimen:  BLOOD RIGHT HAND  Result Value Ref Range Status   Specimen Description BLOOD RIGHT HAND  Final   Special Requests   Final    BOTTLES DRAWN AEROBIC AND ANAEROBIC Blood Culture results may not be optimal due to an inadequate volume of blood received in culture bottles   Culture   Final    NO GROWTH 5 DAYS Performed at [facility], [place] [address], [place],  [address]    Report Status 02/05/2022 FINAL  Final     SIGNED:   Florencia Reasons, MD  [facility] 02/08/2022, 3:24 PM Pager   If 7PM-7AM, please contact night-coverage [email or web address] Password [email or web address]

## 2022-02-07 NOTE — Care Management Important Message (Signed)
Important Message  Patient Details  Name: [name] [name] MRN: [id number] Date of Birth: Aug 09, 1960   Medicare Important Message Given:  Yes     [name] 02/07/2022, 9:01 AM

## 2022-02-07 NOTE — NC FL2 (Signed)
[place] LEVEL OF CARE SCREENING TOOL     IDENTIFICATION  Patient Name: [name] [name] Birthdate: June 14, 1961 Sex: male Admission Date (Current Location): 01/31/2022  [facility] and [place] Number:  Herbalist and Address:  [facility] [place], [place] [address], [place], [place] [address]      Provider Number: [id number]  Attending Physician Name and Address:  Charlynne Cousins, MD  Relative Name and Phone Number:  Joelene Millin [phone]    Current Level of Care: Hospital Recommended Level of Care: [place] Prior Approval Number:    Date Approved/Denied:   PASRR Number: [id number]  Discharge Plan: SNF    Current Diagnoses: Patient Active Problem List   Diagnosis Date Noted   Hx of AKA (above knee amputation), left ([place]) 01/31/2022   Cardiac arrest ([place]) 01/31/2022   Cardiogenic shock ([place]) 01/31/2022   DVT (deep venous thrombosis) ([place]) 01/31/2022   Anoxic brain injury ([place]) 01/31/2022   Acute respiratory failure ([place]) 01/31/2022   Lactic acidosis 01/31/2022   Thrombocytopenia ([place]) 01/31/2022   Hyperglycemia 01/31/2022   STEMI (ST elevation myocardial infarction) ([place]) 01/31/2022   Pulmonary emboli ([place]) 01/31/2022   Multiple fractures of ribs, left side, initial encounter for closed fracture 01/31/2022   Sternal fracture 01/31/2022   Pulmonary contusion 01/31/2022   Aspiration pneumonia ([place]) 01/31/2022   H/O: stroke 01/31/2022   Pressure injury of skin 01/31/2022   Bite wound of left hand 09/27/2021   Arm pain 07/25/2021   Paronychia of great toe 01/28/2021   Onychomycosis 01/28/2021   Critical limb ischemia with history of revascularization of same extremity ([place]) 04/28/2020   History of COVID-19 05/10/2019   PAD (peripheral artery disease) ([place])    History of critical lower limb ischemia 02/19/2019   S/P lumbar fusion, Lower back pain 01/03/2018   Coronary artery disease involving native coronary  artery of native heart with unstable angina pectoris ([place])    Preventative health care 03/13/2017   Transaminitis 12/16/2015   Bilateral shoulder pain 08/20/2012   Tobacco use disorder 08/20/2012   HTN (hypertension) 04/20/2011   HLD (hyperlipidemia) 10/08/2009   Rheumatoid arthritis ([place]) 08/11/2009    Orientation RESPIRATION BLADDER Height & Weight     Self, Time, Situation, Place  O2 (Nasal Cannula 3 liters) Incontinent, External catheter (External Urinary Catheter) Weight: 183 lb 3.2 oz (83.1 kg) Height:  '5\' 8"'$  (172.7 cm)  BEHAVIORAL SYMPTOMS/MOOD NEUROLOGICAL BOWEL NUTRITION STATUS      Incontinent Diet (Please see discharge summary)  AMBULATORY STATUS COMMUNICATION OF NEEDS Skin   Extensive Assist Verbally Other (Comment) (Appropriate for ethnicity,dry,ecchymosis,chest,arms,bilateral,non-tenting,PI buttocks,Right,stage 1,foam lift dressing to assess site every shift)                       Personal Care Assistance Level of Assistance  Bathing, Feeding, Dressing Bathing Assistance: Limited assistance Feeding assistance: Limited assistance (Needs set up) Dressing Assistance: Limited assistance     Functional Limitations Info  Sight, Hearing, Speech Sight Info: Adequate (WDL) Hearing Info: Adequate (WDL) Speech Info: Adequate    SPECIAL CARE FACTORS FREQUENCY  PT (By licensed PT), OT (By licensed OT)     PT Frequency: 5x min weekly OT Frequency: 5x min weekly            Contractures Contractures Info: Not present    Additional Factors Info  Code Status, Allergies, Insulin Sliding Scale Code Status Info: FULL Allergies Info: Dilaudid (hydromorphone Hcl),Ramipril,Tramadol   Insulin Sliding Scale  Info: insulin aspart (novoLOG) injection 0-15 Units 3 times daily with meals       Current Medications (02/07/2022):  This is the current hospital active medication list Current Facility-Administered Medications  Medication Dose Route Frequency Provider Last Rate  Last Admin   0.9 %  sodium chloride infusion  250 mL Intravenous PRN Martinique, Peter M, MD       0.9 %  sodium chloride infusion   Intravenous Continuous Charlynne Cousins, MD 75 mL/hr at 02/07/22 0856 New Bag at 02/07/22 0856   acetaminophen (TYLENOL) tablet 650 mg  650 mg Oral Q4H PRN [place]ique, Peter M, MD       apixaban Arne [place]) tablet 5 mg  5 mg Oral BID Charlynne Cousins, MD   5 mg at 02/07/22 0850   atorvastatin (LIPITOR) tablet 80 mg  80 mg Oral Daily Martinique, Peter M, MD   80 mg at 02/07/22 0851   clopidogrel (PLAVIX) tablet 75 mg  75 mg Oral Daily Martinique, Peter M, MD   75 mg at 02/07/22 0851   docusate sodium (COLACE) capsule 100 mg  100 mg Oral BID Martinique, Peter M, MD   100 mg at 02/07/22 0850   DULoxetine (CYMBALTA) DR capsule 30 mg  30 mg Oral Daily Martinique, Peter M, MD   30 mg at 02/07/22 0851   guaiFENesin-dextromethorphan (ROBITUSSIN DM) 100-10 MG/5ML syrup 5 mL  5 mL Oral Q4H PRN Martinique, Peter M, MD   5 mL at 02/07/22 0850   HYDROcodone-acetaminophen (NORCO/VICODIN) 5-325 MG per tablet 1 tablet  1 tablet Oral Q4H PRN Martinique, Peter M, MD   1 tablet at 02/07/22 1226   insulin aspart (novoLOG) injection 0-15 Units  0-15 Units Subcutaneous TID WC Martinique, Peter M, MD       metoprolol tartrate (LOPRESSOR) tablet 12.5 mg  12.5 mg Oral BID Sherren Mocha, MD   12.5 mg at 02/07/22 0851   ondansetron (ZOFRAN) injection 4 mg  4 mg Intravenous Q6H PRN [place], Peter M, MD   4 mg at 02/05/22 2328   ondansetron (ZOFRAN) injection 4 mg  4 mg Intravenous Q6H PRN [place]ique, Peter M, MD       Oral care mouth rinse  15 mL Mouth Rinse PRN Martinique, Peter M, MD       polyethylene glycol [facility] Floria Raveling) packet 17 g  17 g Oral Daily Martinique, Peter M, MD   17 g at 02/07/22 0850   sodium chloride flush (NS) 0.9 % injection 3 mL  3 mL Intravenous Q12H Martinique, Peter M, MD   3 mL at 02/07/22 0853   sodium chloride flush (NS) 0.9 % injection 3 mL  3 mL Intravenous Q12H Martinique, Peter M, MD   3 mL at 02/07/22  0853   sodium chloride flush (NS) 0.9 % injection 3 mL  3 mL Intravenous PRN [place]ique, Peter M, MD         Discharge Medications: Please see discharge summary for a list of discharge medications.  Relevant Imaging Results:  Relevant Lab Results:   Additional Information [id number]  Milas Gain, LCSWA

## 2022-02-07 NOTE — Progress Notes (Addendum)
Progress Note  Patient Name: [name] [name] Date of Encounter: 02/07/2022  [facility] Cardiologist: Candee Furbish, MD   Subjective   Working with PT this morning. Still with chest wall pain/soreness.   Inpatient Medications    Scheduled Meds:  apixaban  5 mg Oral BID   atorvastatin  80 mg Oral Daily   clopidogrel  75 mg Oral Daily   docusate sodium  100 mg Oral BID   DULoxetine  30 mg Oral Daily   insulin aspart  0-15 Units Subcutaneous TID WC   metoprolol tartrate  12.5 mg Oral BID   polyethylene glycol  17 g Oral Daily   sodium chloride flush  3 mL Intravenous Q12H   sodium chloride flush  3 mL Intravenous Q12H   Continuous Infusions:  sodium chloride     PRN Meds: sodium chloride, acetaminophen, guaiFENesin-dextromethorphan, HYDROcodone-acetaminophen, ondansetron (ZOFRAN) IV, ondansetron (ZOFRAN) IV, mouth rinse, sodium chloride flush   Vital Signs    Vitals:   02/06/22 1412 02/06/22 1938 02/07/22 0553 02/07/22 0751  BP: 119/87 122/85 117/89 (!) 130/91  Pulse: 79 81 82 81  Resp: 19  (!) 21 (!) 21  Temp: (!) 97.5 F (36.4 C) 98.3 F (36.8 C) 97.7 F (36.5 C) 98 F (36.7 C)  TempSrc: Oral Oral Oral Oral  SpO2: 93% 94% 94% 95%  Weight:   83.1 kg   Height:        Intake/Output Summary (Last 24 hours) at 02/07/2022 0801 Last data filed at 02/07/2022 0500 Gross per 24 hour  Intake 1000 ml  Output 625 ml  Net 375 ml      02/07/2022    5:53 AM 02/06/2022    3:17 AM 02/03/2022    4:46 AM  Last 3 Weights  Weight (lbs) 183 lb 3.2 oz 176 lb 5.9 oz 169 lb 5 oz  Weight (kg) 83.1 kg 80 kg 76.8 kg      Telemetry    Sinus Rhythm - Personally Reviewed  ECG    No new tracing  Physical Exam   GEN: No acute distress.   Neck: No JVD Cardiac: RRR, no murmurs, rubs, or gallops.  Respiratory: Clear to auscultation bilaterally. GI: Soft, nontender, non-distended  MS: No edema; Right AKA Neuro:  Nonfocal  Psych: Normal affect   Labs    High Sensitivity  Troponin:   Recent Labs  Lab 01/31/22 1306 01/31/22 1843  TROPONINIHS 2,599* 7,479*     Chemistry Recent Labs  Lab 01/31/22 1306 01/31/22 1354 01/31/22 1843 02/01/22 0134 02/01/22 0414 02/01/22 1625 02/02/22 0101 02/02/22 0115 02/03/22 0327 02/04/22 0036 02/05/22 0208 02/06/22 0736 02/07/22 0322  NA 140   < > 140   < > 139 137   < >  --  137   < > 137 138 138  K 4.2   < > 3.9   < > 3.5 3.5   < >  --  4.2   < > 3.8 3.4* 4.1  CL 107  --  107  --  101 101   < >  --  105   < > 104 106 107  CO2 10*  --  19*  --  22 24   < >  --  22   < > 21* 21* 22  GLUCOSE 305*  --  236*  --  156* 127*   < >  --  117*   < > 91 105* 80  BUN 17  --  23  --  29* 32*   < >  --  39*   < > 37* 30* 27*  CREATININE 1.36*   < > 1.59*  --  2.11* 2.33*   < >  --  2.36*   < > 1.86* 1.43* 1.29*  CALCIUM 8.9  --  8.5*  --  7.7* 7.2*   < >  --  7.5*   < > 7.8* 7.8* 8.1*  MG  --   --   --    < > 1.4* 2.0  --  2.2 2.1  --   --   --   --   PROT 5.8*  --  6.9  --  6.1*  --   --   --   --   --   --   --   --   ALBUMIN 2.6*  --  3.0*  --  2.8*  --   --   --   --   --   --   --   --   AST 416*  --  598*  --  319*  --   --   --   --   --   --   --   --   ALT 406*  --  462*  --  297*  --   --   --   --   --   --   --   --   ALKPHOS 173*  --  228*  --  178*  --   --   --   --   --   --   --   --   BILITOT 0.3  --  0.5  --  0.5  --   --   --   --   --   --   --   --   GFRNONAA 59*  --  49*  --  35* 31*   < >  --  31*   < > 41* 56* >60  ANIONGAP 23*  --  14  --  16* 12   < >  --  10   < > '12 11 9   '$ < > = values in this interval not displayed.    Lipids  Recent Labs  Lab 01/31/22 1306 02/01/22 0414  CHOL 165  --   TRIG 279* 226*  HDL 19*  --   LDLCALC 90  --   CHOLHDL 8.7  --     Hematology Recent Labs  Lab 02/05/22 0208 02/06/22 0736 02/07/22 0322  WBC 11.2* 12.6* 14.6*  RBC 3.88* 3.76* 3.90*  HGB 10.0* 9.6* 10.0*  HCT 31.3* 30.5* 31.8*  MCV 80.7 81.1 81.5  MCH 25.8* 25.5* 25.6*  MCHC 31.9 31.5 31.4   RDW 17.7* 17.6* 18.3*  PLT 219 238 283   Thyroid No results for input(s): "TSH", "FREET4" in the last 168 hours.  BNPNo results for input(s): "BNP", "PROBNP" in the last 168 hours.  DDimer No results for input(s): "DDIMER" in the last 168 hours.   Radiology    CARDIAC CATHETERIZATION  Result Date: 02/06/2022   Dist Cx lesion is 30% stenosed.   Prox RCA lesion is 100% stenosed.   LV end diastolic pressure is normal. Single vessel occlusive CAD. Occlusion of proximal nondominant RCA which is new since 2018. Normal LVEDP Plan: medical therapy    Cardiac Studies   Cath: 02/06/22    Dist Cx lesion is 30% stenosed.   Prox RCA lesion is 100%  stenosed.   LV end diastolic pressure is normal.   Single vessel occlusive CAD. Occlusion of proximal nondominant RCA which is new since 2018.  Normal LVEDP   Plan: medical therapy  Diagnostic Dominance: Left   Echo: 02/01/22   IMPRESSIONS     1. Left ventricular ejection fraction, by estimation, is 60 to 65%. The  left ventricle has normal function. Left ventricular diastolic parameters  are indeterminate. There is the interventricular septum is flattened in  systole, consistent with right  ventricular pressure overload.   2. The Right ventricle is severly dilated with hypokinesis in the basal  and mid portion but hyperkinesis in the apical portion - consistent with  McConnell's sign which was also present in the TTE performed 01/31/22. Right  ventricular systolic function is  moderately reduced. The right ventricular size is severely enlarged. There  is normal pulmonary artery systolic pressure.   3. The mitral valve is grossly normal. No evidence of mitral valve  regurgitation. No evidence of mitral stenosis.   4. The aortic valve is grossly normal. Aortic valve regurgitation is  trivial. Aortic valve sclerosis is present, with no evidence of aortic  valve stenosis.   5. The inferior vena cava is normal in size with greater than 50%   respiratory variability, suggesting right atrial pressure of 3 mmHg.   FINDINGS   Left Ventricle: Left ventricular ejection fraction, by estimation, is 60  to 65%. The left ventricle has normal function. Definity contrast agent  was given IV to delineate the left ventricular endocardial borders. The  left ventricular internal cavity  size was small. The interventricular septum is flattened in systole,  consistent with right ventricular pressure overload. Left ventricular  diastolic parameters are indeterminate.      LV Wall Scoring:  The apical lateral segment, apical anterior segment, apical inferior  segment,  and apex are akinetic. The anterior wall, antero-lateral wall, mid and  distal  anterior septum, inferior septum, inferior wall, and mid inferolateral  segment are normal.   Right Ventricle: The Right ventricle is severly dilated with hypokinesis  in the basal and mid portion but hyperkinesis in the apical portion -  consistent with McConnell's sign which was also present in the TTE  performed 01/31/22. The right ventricular size   is severely enlarged. Right ventricular systolic function is moderately  reduced. There is normal pulmonary artery systolic pressure. The tricuspid  regurgitant velocity is 2.43 m/s, and with an assumed right atrial  pressure of 3 mmHg, the estimated right   ventricular systolic pressure is 71.2 mmHg.   Left Atrium: Left atrial size was not assessed.   Right Atrium: Right atrial size was not assessed.   Pericardium: Presence of epicardial fat layer.   Mitral Valve: The mitral valve is grossly normal. No evidence of mitral  valve stenosis.   Tricuspid Valve: The tricuspid valve is grossly normal. Tricuspid valve  regurgitation is not demonstrated. No evidence of tricuspid stenosis.   Aortic Valve: The aortic valve is grossly normal. Aortic valve  regurgitation is trivial. Aortic valve sclerosis is present, with no  evidence of aortic  valve stenosis.   Pulmonic Valve: The pulmonic valve was not well visualized.   Aorta: The aortic root is normal in size and structure and the ascending  aorta was not well visualized.   Venous: The inferior vena cava is normal in size with greater than 50%  respiratory variability, suggesting right atrial pressure of 3 mmHg.   Patient Profile  61 y.o. male  with PAD s/p right AKA and multiple prior interventions, nonobstructive CAD, mild to moderate AI,  and Libman-Sacks endocarditis (echo 2013), and RA who was seen 01/31/2022 for the evaluation of cardiac arrest at the request of Dr. Regenia Skeeter.  Assessment & Plan    Cardiac arrest Ventricular fibrillation Acute hypoxic respiratory failure STEMI --Found unresponsive by bystander and had 35 minutes of CPR. EKG on arrival with concern for inferior STEMI which was canceled given his prolonged down time. Initially intubated and required pressor support. S/p extubation 8/9.  Treated with IV amiodarone.  High-sensitivity troponin peaked at 7479.  Underwent cardiac catheterization 814 noted above with occlusion of proximal nondominant RCA.  Normal LVEDP.  Recommendations for medical management. --Continue atorvastatin 80 mg daily, Plavix 75 mg daily, Eliquis resumed, metoprolol 12.5 mg twice daily. No plans for triple therapy given no intervention   Recent DVT PE Severely dilated RV --Was on Eliquis PTA, resumed post cath   AKI s/p cardiac arrest: Creatinine 1.1, peaked at 2.38 postcardiac arrest and hypoperfusion. Improved to 1.29 --Currently on IV fluids --Follow BMET   PVD s/p left AKA -- Wheelchair-bound at baseline   Prior CVA: Subacute occipital CVA several weeks prior to admission. --Continue Plavix and statin   Shock Liver: in the setting of cardiac arrest -- improving   Will arrange for outpatient follow up appt.   For questions or updates, please contact [facility] Please consult [email or web address] for contact info  under        Signed, Reino Bellis, NP  02/07/2022, 8:01 AM    Patient seen, examined. Available data reviewed. Agree with findings, assessment, and plan as outlined by Reino Bellis, NP.  Patient independently interviewed and examined.  Agree with findings as outlined above.  On my exam, he is alert, oriented, no distress.  Lungs with few rhonchi but otherwise clear, heart is regular rate and rhythm with no murmur gallop, abdomen is soft and nontender, extremity exam is unchanged with a right AKA and no edema on the left.  Patient will continue on apixaban for DVT/PE and we will treat him with clopidogrel alone for antiplatelet therapy in the setting of his recent MI complicated by V-fib arrest.  He is appropriately on a beta-blocker and high intensity statin drug.  LVEF is normal.  No other medication changes recommended.  Sherren Mocha, M.D. 02/07/2022 3:35 PM

## 2022-02-08 ENCOUNTER — Encounter: Payer: Medicare Other | Admitting: Physical Therapy

## 2022-02-08 DIAGNOSIS — J910 Malignant pleural effusion: Secondary | ICD-10-CM | POA: Diagnosis not present

## 2022-02-08 DIAGNOSIS — M96A3 Multiple fractures of ribs associated with chest compression and cardiopulmonary resuscitation: Secondary | ICD-10-CM | POA: Diagnosis not present

## 2022-02-08 DIAGNOSIS — R0781 Pleurodynia: Secondary | ICD-10-CM | POA: Diagnosis not present

## 2022-02-08 DIAGNOSIS — Z743 Need for continuous supervision: Secondary | ICD-10-CM | POA: Diagnosis not present

## 2022-02-08 DIAGNOSIS — I69391 Dysphagia following cerebral infarction: Secondary | ICD-10-CM | POA: Diagnosis not present

## 2022-02-08 DIAGNOSIS — M96A1 Fracture of sternum associated with chest compression and cardiopulmonary resuscitation: Secondary | ICD-10-CM | POA: Diagnosis not present

## 2022-02-08 DIAGNOSIS — J9811 Atelectasis: Secondary | ICD-10-CM | POA: Diagnosis not present

## 2022-02-08 DIAGNOSIS — X58XXXA Exposure to other specified factors, initial encounter: Secondary | ICD-10-CM | POA: Diagnosis not present

## 2022-02-08 DIAGNOSIS — Z89612 Acquired absence of left leg above knee: Secondary | ICD-10-CM | POA: Diagnosis not present

## 2022-02-08 DIAGNOSIS — K859 Acute pancreatitis without necrosis or infection, unspecified: Secondary | ICD-10-CM | POA: Diagnosis not present

## 2022-02-08 DIAGNOSIS — Z7401 Bed confinement status: Secondary | ICD-10-CM | POA: Diagnosis not present

## 2022-02-08 DIAGNOSIS — I469 Cardiac arrest, cause unspecified: Secondary | ICD-10-CM | POA: Diagnosis not present

## 2022-02-08 DIAGNOSIS — M069 Rheumatoid arthritis, unspecified: Secondary | ICD-10-CM | POA: Diagnosis not present

## 2022-02-08 DIAGNOSIS — J90 Pleural effusion, not elsewhere classified: Secondary | ICD-10-CM | POA: Diagnosis not present

## 2022-02-08 DIAGNOSIS — Z885 Allergy status to narcotic agent status: Secondary | ICD-10-CM | POA: Diagnosis not present

## 2022-02-08 DIAGNOSIS — J9601 Acute respiratory failure with hypoxia: Secondary | ICD-10-CM | POA: Diagnosis not present

## 2022-02-08 DIAGNOSIS — R911 Solitary pulmonary nodule: Secondary | ICD-10-CM | POA: Diagnosis not present

## 2022-02-08 DIAGNOSIS — I700 Atherosclerosis of aorta: Secondary | ICD-10-CM | POA: Diagnosis not present

## 2022-02-08 DIAGNOSIS — R160 Hepatomegaly, not elsewhere classified: Secondary | ICD-10-CM | POA: Diagnosis not present

## 2022-02-08 DIAGNOSIS — C229 Malignant neoplasm of liver, not specified as primary or secondary: Secondary | ICD-10-CM | POA: Diagnosis not present

## 2022-02-08 DIAGNOSIS — J9602 Acute respiratory failure with hypercapnia: Secondary | ICD-10-CM | POA: Diagnosis not present

## 2022-02-08 DIAGNOSIS — R079 Chest pain, unspecified: Secondary | ICD-10-CM | POA: Diagnosis not present

## 2022-02-08 DIAGNOSIS — R933 Abnormal findings on diagnostic imaging of other parts of digestive tract: Secondary | ICD-10-CM | POA: Diagnosis not present

## 2022-02-08 DIAGNOSIS — G931 Anoxic brain damage, not elsewhere classified: Secondary | ICD-10-CM | POA: Diagnosis not present

## 2022-02-08 DIAGNOSIS — S27329D Contusion of lung, unspecified, subsequent encounter: Secondary | ICD-10-CM | POA: Diagnosis not present

## 2022-02-08 DIAGNOSIS — I82409 Acute embolism and thrombosis of unspecified deep veins of unspecified lower extremity: Secondary | ICD-10-CM | POA: Diagnosis not present

## 2022-02-08 DIAGNOSIS — R846 Abnormal cytological findings in specimens from respiratory organs and thorax: Secondary | ICD-10-CM | POA: Diagnosis not present

## 2022-02-08 DIAGNOSIS — I10 Essential (primary) hypertension: Secondary | ICD-10-CM | POA: Diagnosis not present

## 2022-02-08 DIAGNOSIS — R262 Difficulty in walking, not elsewhere classified: Secondary | ICD-10-CM | POA: Diagnosis not present

## 2022-02-08 DIAGNOSIS — R0689 Other abnormalities of breathing: Secondary | ICD-10-CM | POA: Diagnosis not present

## 2022-02-08 DIAGNOSIS — R2681 Unsteadiness on feet: Secondary | ICD-10-CM | POA: Diagnosis not present

## 2022-02-08 DIAGNOSIS — I5032 Chronic diastolic (congestive) heart failure: Secondary | ICD-10-CM | POA: Diagnosis not present

## 2022-02-08 DIAGNOSIS — R1311 Dysphagia, oral phase: Secondary | ICD-10-CM | POA: Diagnosis not present

## 2022-02-08 DIAGNOSIS — R531 Weakness: Secondary | ICD-10-CM | POA: Diagnosis not present

## 2022-02-08 DIAGNOSIS — C779 Secondary and unspecified malignant neoplasm of lymph node, unspecified: Secondary | ICD-10-CM | POA: Diagnosis not present

## 2022-02-08 DIAGNOSIS — Z87891 Personal history of nicotine dependence: Secondary | ICD-10-CM | POA: Diagnosis not present

## 2022-02-08 DIAGNOSIS — I25110 Atherosclerotic heart disease of native coronary artery with unstable angina pectoris: Secondary | ICD-10-CM | POA: Diagnosis not present

## 2022-02-08 DIAGNOSIS — J189 Pneumonia, unspecified organism: Secondary | ICD-10-CM | POA: Diagnosis present

## 2022-02-08 DIAGNOSIS — S2242XD Multiple fractures of ribs, left side, subsequent encounter for fracture with routine healing: Secondary | ICD-10-CM | POA: Diagnosis not present

## 2022-02-08 DIAGNOSIS — Z23 Encounter for immunization: Secondary | ICD-10-CM | POA: Diagnosis not present

## 2022-02-08 DIAGNOSIS — I213 ST elevation (STEMI) myocardial infarction of unspecified site: Secondary | ICD-10-CM | POA: Diagnosis not present

## 2022-02-08 DIAGNOSIS — K760 Fatty (change of) liver, not elsewhere classified: Secondary | ICD-10-CM | POA: Diagnosis not present

## 2022-02-08 DIAGNOSIS — S2220XD Unspecified fracture of sternum, subsequent encounter for fracture with routine healing: Secondary | ICD-10-CM | POA: Diagnosis not present

## 2022-02-08 DIAGNOSIS — Z8673 Personal history of transient ischemic attack (TIA), and cerebral infarction without residual deficits: Secondary | ICD-10-CM | POA: Diagnosis not present

## 2022-02-08 DIAGNOSIS — R091 Pleurisy: Secondary | ICD-10-CM | POA: Diagnosis not present

## 2022-02-08 DIAGNOSIS — J918 Pleural effusion in other conditions classified elsewhere: Secondary | ICD-10-CM | POA: Diagnosis not present

## 2022-02-08 DIAGNOSIS — K8689 Other specified diseases of pancreas: Secondary | ICD-10-CM | POA: Diagnosis not present

## 2022-02-08 DIAGNOSIS — I252 Old myocardial infarction: Secondary | ICD-10-CM | POA: Diagnosis not present

## 2022-02-08 DIAGNOSIS — D649 Anemia, unspecified: Secondary | ICD-10-CM | POA: Diagnosis not present

## 2022-02-08 DIAGNOSIS — C159 Malignant neoplasm of esophagus, unspecified: Secondary | ICD-10-CM | POA: Diagnosis not present

## 2022-02-08 DIAGNOSIS — I2782 Chronic pulmonary embolism: Secondary | ICD-10-CM | POA: Diagnosis not present

## 2022-02-08 DIAGNOSIS — R0902 Hypoxemia: Secondary | ICD-10-CM | POA: Diagnosis not present

## 2022-02-08 DIAGNOSIS — I739 Peripheral vascular disease, unspecified: Secondary | ICD-10-CM | POA: Diagnosis not present

## 2022-02-08 DIAGNOSIS — R4182 Altered mental status, unspecified: Secondary | ICD-10-CM | POA: Diagnosis not present

## 2022-02-08 DIAGNOSIS — M6281 Muscle weakness (generalized): Secondary | ICD-10-CM | POA: Diagnosis not present

## 2022-02-08 DIAGNOSIS — K7689 Other specified diseases of liver: Secondary | ICD-10-CM | POA: Diagnosis not present

## 2022-02-08 DIAGNOSIS — I2582 Chronic total occlusion of coronary artery: Secondary | ICD-10-CM | POA: Diagnosis not present

## 2022-02-08 DIAGNOSIS — I462 Cardiac arrest due to underlying cardiac condition: Secondary | ICD-10-CM | POA: Diagnosis not present

## 2022-02-08 DIAGNOSIS — I2510 Atherosclerotic heart disease of native coronary artery without angina pectoris: Secondary | ICD-10-CM | POA: Diagnosis not present

## 2022-02-08 DIAGNOSIS — D509 Iron deficiency anemia, unspecified: Secondary | ICD-10-CM | POA: Diagnosis not present

## 2022-02-08 DIAGNOSIS — R932 Abnormal findings on diagnostic imaging of liver and biliary tract: Secondary | ICD-10-CM | POA: Diagnosis not present

## 2022-02-08 DIAGNOSIS — E785 Hyperlipidemia, unspecified: Secondary | ICD-10-CM | POA: Diagnosis not present

## 2022-02-08 DIAGNOSIS — I2699 Other pulmonary embolism without acute cor pulmonale: Secondary | ICD-10-CM | POA: Diagnosis not present

## 2022-02-08 DIAGNOSIS — I110 Hypertensive heart disease with heart failure: Secondary | ICD-10-CM | POA: Diagnosis not present

## 2022-02-08 DIAGNOSIS — Z20822 Contact with and (suspected) exposure to covid-19: Secondary | ICD-10-CM | POA: Diagnosis not present

## 2022-02-08 DIAGNOSIS — Z86718 Personal history of other venous thrombosis and embolism: Secondary | ICD-10-CM | POA: Diagnosis not present

## 2022-02-08 DIAGNOSIS — I517 Cardiomegaly: Secondary | ICD-10-CM | POA: Diagnosis not present

## 2022-02-08 DIAGNOSIS — R069 Unspecified abnormalities of breathing: Secondary | ICD-10-CM | POA: Diagnosis not present

## 2022-02-08 LAB — CBC
HCT: 33 % — ABNORMAL LOW (ref 39.0–52.0)
Hemoglobin: 10.4 g/dL — ABNORMAL LOW (ref 13.0–17.0)
MCH: 25.7 pg — ABNORMAL LOW (ref 26.0–34.0)
MCHC: 31.5 g/dL (ref 30.0–36.0)
MCV: 81.5 fL (ref 80.0–100.0)
Platelets: 337 10*3/uL (ref 150–400)
RBC: 4.05 MIL/uL — ABNORMAL LOW (ref 4.22–5.81)
RDW: 18.5 % — ABNORMAL HIGH (ref 11.5–15.5)
WBC: 13.6 10*3/uL — ABNORMAL HIGH (ref 4.0–10.5)
nRBC: 0 % (ref 0.0–0.2)

## 2022-02-08 LAB — GLUCOSE, CAPILLARY
Glucose-Capillary: 58 mg/dL — ABNORMAL LOW (ref 70–99)
Glucose-Capillary: 74 mg/dL (ref 70–99)
Glucose-Capillary: 79 mg/dL (ref 70–99)
Glucose-Capillary: 86 mg/dL (ref 70–99)

## 2022-02-08 LAB — LIPOPROTEIN A (LPA): Lipoprotein (a): 10.2 nmol/L (ref <75.0)

## 2022-02-08 MED ORDER — LIDOCAINE 5 % EX PTCH
1.0000 | MEDICATED_PATCH | CUTANEOUS | Status: DC
  Administered 2022-02-08: 1 via TRANSDERMAL
  Filled 2022-02-08: qty 1

## 2022-02-08 MED ORDER — FUROSEMIDE 40 MG PO TABS
40.0000 mg | ORAL_TABLET | ORAL | 0 refills | Status: DC
Start: 2022-02-08 — End: 2022-02-17

## 2022-02-08 MED ORDER — LIDOCAINE 5 % EX PTCH: 1.0000 | MEDICATED_PATCH | CUTANEOUS | 0 refills | Status: DC

## 2022-02-08 MED ORDER — FUROSEMIDE 10 MG/ML IJ SOLN
40.0000 mg | Freq: Once | INTRAMUSCULAR | Status: AC
  Administered 2022-02-08: 40 mg via INTRAVENOUS
  Filled 2022-02-08: qty 4

## 2022-02-08 NOTE — Progress Notes (Signed)
OT Cancellation Note  Patient Details Name: [name] [name] MRN: [id number] DOB: 1960-08-01   Cancelled Treatment:    Reason Eval/Treat Not Completed: Patient declined, no reason specified Pt reported plan to "sit up in bed today and rest" due to rib pain despite pain premedication.  Layla Maw 02/08/2022, 1:51 PM

## 2022-02-08 NOTE — TOC Transition Note (Signed)
Transition of Care [facility]) - CM/SW Discharge Note   Patient Details  Name: [name] [name] MRN: [id number] Date of Birth: 07-06-1960  Transition of [facility]) CM/SW Contact:  Milas Gain, [place] Phone Number: 02/08/2022, 3:34 PM   Clinical Narrative:     Patient will DC to: [facility]   Anticipated DC date: 02/08/2022  Family notified: Timmothy Sours   Transport by: Corey Harold  ?  Per MD patient ready for DC to [facility] . RN, patient, patient's family, and facility notified of DC. Discharge Summary sent to facility. RN given number for report tele# [phone] RM# [phone]. DC packet on chart. Ambulance transport requested for patient.  CSW signing off.   Final next level of care: Skilled Nursing Facility Barriers to Discharge: No Barriers Identified   Patient Goals and CMS Choice Patient states their goals for this hospitalization and ongoing recovery are:: SNF CMS Medicare.gov Compare Post Acute Care list provided to:: Patient Choice offered to / list presented to : Patient  Discharge Placement              Patient chooses bed at: [facility] Patient to be transferred to facility by: [place] Name of family member notified: Joelene Millin Patient and family notified of of transfer: 02/08/22  Discharge Plan and Services In-house Referral: Clinical Social Work Discharge Planning Services: CM Consult Post Acute Care Choice: NA            DME Agency: NA       HH Arranged: PT, OT [place] Agency: [place] Date [place]: 02/02/22 Time [place]: [id number] Representative spoke with at [place]: [place] ([place]) Interventions     Readmission Risk Interventions    05/13/2020    2:32 PM 05/04/2020   12:24 PM  Readmission Risk Prevention Plan  Post Dischage Appt  Complete  Medication Screening  Complete  Transportation Screening Complete Complete  PCP or Specialist Appt within 5-7 Days  Complete   Home Care Screening Complete   Medication Review (RN CM) Complete

## 2022-02-08 NOTE — Progress Notes (Signed)
   02/08/22 1259  Assess: MEWS Score  Temp 97.7 F (36.5 C)  BP (!) 156/102  Pulse Rate 96  Resp (!) 26  Level of Consciousness Alert  Assess: MEWS Score  MEWS Temp 0  MEWS Systolic 0  MEWS Pulse 0  MEWS RR 2  MEWS LOC 0  MEWS Score 2  MEWS Score Color Yellow  Assess: if the MEWS score is Yellow or Red  Were vital signs taken at a resting state? Yes  Focused Assessment No change from prior assessment  MEWS guidelines implemented *See Row Information* Yes  Treat  Pain Scale 0-10  Pain Score 7  Early Detection of Sepsis Score *See Row Information* Low  Take Vital Signs  Increase Vital Sign Frequency  Yellow: Q 2hr X 2 then Q 4hr X 2, if remains yellow, continue Q 4hrs  Escalate  MEWS: Escalate Yellow: discuss with charge nurse/RN and consider discussing with provider and RRT  Notify: Charge Nurse/RN  Name of Charge Nurse/RN Notified Christy, RN  Date Charge Nurse/RN Notified 02/08/22  Time Charge Nurse/RN Notified 1335  Document  Patient Outcome Not stable and remains on department  Progress note created (see row info) Yes  Assess: SIRS CRITERIA  SIRS Temperature  0  SIRS Pulse 1  SIRS Respirations  1  SIRS WBC 1  SIRS Score Sum  3

## 2022-02-08 NOTE — Progress Notes (Signed)
Report called in and given to receiving facility's RN

## 2022-02-08 NOTE — Progress Notes (Signed)
Hypoglycemic Event  CBG: 58  Treatment: 4 oz orange juice  Symptoms: None   Follow-up CBG: [id number] CBG Result:74  Possible Reasons for Event: inadequate meal intake     [name] [name]

## 2022-02-08 NOTE — Progress Notes (Addendum)
No charge note: Uneventful night, he is excited to go to rehab today, details please see d/c summary done by Dr Aileen Fass.  He appears slightly volume overloaded, he received once dose of iv lasix prior to discharge, he is discharged on oral lasix q48hrsx3 doses, further lasix dosing per snf MD.   Addendum,  d/c date updated on DC/ summary done by Dr Aileen Fass per snf facility request.

## 2022-02-08 NOTE — Progress Notes (Signed)
PT Cancellation Note  Patient Details Name: [name] [name] MRN: [id number] DOB: 1961-04-18   Cancelled Treatment:    Reason Eval/Treat Not Completed: Pain limiting ability to participate. PT attempted to see pt for mobility progression however the pt declines due to reports of chest/rib pain. PT will follow up as time allows.   Zenaida Niece 02/08/2022, 3:06 PM

## 2022-02-08 NOTE — Progress Notes (Signed)
Physical Therapy Treatment Patient Details Name: [name] [name] MRN: [id number] DOB: [birth year] Today's Date: 02/08/2022   History of Present Illness 61 yo admitted 8/8 after cardiac arrest at home with 35 min CPR and 3 shocks for ROSC. Non displaced sternal fx and bil rib fx 2-7. Intubated 8/8-8/9. PMHx: PAD, Lt AKA, DVT, HLD, HTN, DJD, GERD, lupus, RA, anemia    PT Comments    Pt tolerates treatment well despite reports of intermittent dizziness. Pt is able to participate in multiple transfers, continuing to cite weakness at this time. Pt is at a high risk for falls as he reports LLE edema, unable to don his prosthetic at this time due to swelling. Pt will benefit from continued aggressive mobilization in an effort to reduce falls risk and to restore independence. PT continues to recommend SNF placement at this time.   Recommendations for follow up therapy are one component of a multi-disciplinary discharge planning process, led by the attending physician.  Recommendations may be updated based on patient status, additional functional criteria and insurance authorization.  Follow Up Recommendations  Skilled nursing-short term rehab (<3 hours/day) Can patient physically be transported by private vehicle: No (pt's prosthetic does not currently fit due to LLE edema)   Assistance Recommended at Discharge Intermittent Supervision/Assistance  Patient can return home with the following A lot of help with walking and/or transfers;A lot of help with bathing/dressing/bathroom;Assistance with cooking/housework;Assist for transportation;Help with stairs or ramp for entrance   Equipment Recommendations  None recommended by PT    Recommendations for Other Services       Precautions / Restrictions Precautions Precautions: Fall Precaution Comments: L AKA, prosthesis Restrictions Weight Bearing Restrictions: No Other Position/Activity Restrictions: L residual limb swollen, pt declines attempts at  donning prosthetic     Mobility  Bed Mobility Overal bed mobility: Needs Assistance Bed Mobility: Supine to Sit     Supine to sit: Supervision          Transfers Overall transfer level: Needs assistance Equipment used: 1 person hand held assist Transfers: Sit to/from Stand, Bed to chair/wheelchair/BSC Sit to Stand: Mod assist     Squat pivot transfers: Mod assist     General transfer comment: pt stands twice from recliner with assistance    Ambulation/Gait                   Stairs             Wheelchair Mobility    Modified Rankin (Stroke Patients Only)       Balance Overall balance assessment: Needs assistance Sitting-balance support: No upper extremity supported, Feet supported Sitting balance-Leahy Scale: Good     Standing balance support: Bilateral upper extremity supported, Reliant on assistive device for balance Standing balance-Leahy Scale: Poor Standing balance comment: modA                            Cognition Arousal/Alertness: Awake/alert Behavior During Therapy: WFL for tasks assessed/performed Overall Cognitive Status: Impaired/Different from baseline Area of Impairment: Memory                     Memory: Decreased short-term memory                  Exercises      General Comments General comments (skin integrity, edema, etc.): VSS on 2L [place]      Pertinent Vitals/Pain Pain Assessment Pain Assessment: Faces Faces Pain  Scale: Hurts even more Pain Location: chest and ribs Pain Descriptors / Indicators: Aching Pain Intervention(s): Premedicated before session    Home Living                          Prior Function            PT Goals (current goals can now be found in the care plan section) Acute Rehab PT Goals Patient Stated Goal: return home Progress towards PT goals: Progressing toward goals    Frequency    Min 3X/week      PT Plan Current plan remains  appropriate    Co-evaluation              AM-PAC PT "6 Clicks" Mobility   Outcome Measure  Help needed turning from your back to your side while in a flat bed without using bedrails?: A Little Help needed moving from lying on your back to sitting on the side of a flat bed without using bedrails?: A Little Help needed moving to and from a bed to a chair (including a wheelchair)?: A Lot Help needed standing up from a chair using your arms (e.g., wheelchair or bedside chair)?: A Lot Help needed to walk in hospital room?: Total Help needed climbing 3-5 steps with a railing? : Total 6 Click Score: 12    End of Session Equipment Utilized During Treatment: Oxygen Activity Tolerance: Patient tolerated treatment well Patient left: in chair;with call bell/phone within reach;with chair alarm set Nurse Communication: Mobility status PT Visit Diagnosis: Other abnormalities of gait and mobility (R26.89);Difficulty in walking, not elsewhere classified (R26.2);Muscle weakness (generalized) (M62.81)     Time: 0258-5277 PT Time Calculation (min) (ACUTE ONLY): 30 min  Charges:  $Therapeutic Activity: 23-37 mins                     Zenaida Niece, PT, DPT Acute Rehabilitation Office [name] 02/08/2022, 3:57 PM

## 2022-02-08 NOTE — TOC Progression Note (Addendum)
Transition of Care [facility]) - Progression Note    Patient Details  Name: [name] [name] MRN: [id number] Date of Birth: 16-Sep-1960  Transition of Care [facility]) CM/SW [place], [place] Phone Number: 02/08/2022, 11:12 AM  Clinical Narrative:    Update-12:54pm- Patients insurance authorization has been approved. [place] ID# [id number]. Insurance authorization has been approved from 8/16-8/18. Patient has SN[facility] bed [facility]. CSW informed MD. Patient has wound vac.  Kitty with [facility] confirmed SNF bed for patient. Patients insurance authorization currently pending. CSW will continue to follow and assist with patients dc planning needs.  Expected Discharge Plan: [place] Barriers to Discharge: Continued Medical Work up  Expected Discharge Plan and Services Expected Discharge Plan: [place] In-house Referral: NA Discharge Planning Services: CM Consult Post Acute Care Choice: NA Living arrangements for the past 2 months: Single Family Home Expected Discharge Date: 02/07/22                 DME Agency: NA       HH Arranged: PT, OT HH Agency: [place] Date [place]: 02/02/22 Time [place]: 8110 Representative spoke with at [place]: [place] ([place]) Interventions    Readmission Risk Interventions    05/13/2020    2:32 PM 05/04/2020   12:24 PM  Readmission Risk Prevention Plan  Post Dischage Appt  Complete  Medication Screening  Complete  Transportation Screening Complete Complete  PCP or Specialist Appt within 5-7 Days Complete   Home Care Screening Complete   Medication Review (RN CM) Complete

## 2022-02-09 DIAGNOSIS — I82409 Acute embolism and thrombosis of unspecified deep veins of unspecified lower extremity: Secondary | ICD-10-CM | POA: Diagnosis not present

## 2022-02-09 DIAGNOSIS — S27329D Contusion of lung, unspecified, subsequent encounter: Secondary | ICD-10-CM | POA: Diagnosis not present

## 2022-02-09 DIAGNOSIS — I2699 Other pulmonary embolism without acute cor pulmonale: Secondary | ICD-10-CM | POA: Diagnosis not present

## 2022-02-09 DIAGNOSIS — S2242XD Multiple fractures of ribs, left side, subsequent encounter for fracture with routine healing: Secondary | ICD-10-CM | POA: Diagnosis not present

## 2022-02-10 DIAGNOSIS — I469 Cardiac arrest, cause unspecified: Secondary | ICD-10-CM | POA: Diagnosis not present

## 2022-02-10 DIAGNOSIS — E785 Hyperlipidemia, unspecified: Secondary | ICD-10-CM | POA: Diagnosis not present

## 2022-02-10 DIAGNOSIS — S2220XD Unspecified fracture of sternum, subsequent encounter for fracture with routine healing: Secondary | ICD-10-CM | POA: Diagnosis not present

## 2022-02-10 DIAGNOSIS — I213 ST elevation (STEMI) myocardial infarction of unspecified site: Secondary | ICD-10-CM | POA: Diagnosis not present

## 2022-02-10 DIAGNOSIS — S2242XD Multiple fractures of ribs, left side, subsequent encounter for fracture with routine healing: Secondary | ICD-10-CM | POA: Diagnosis not present

## 2022-02-10 DIAGNOSIS — J9602 Acute respiratory failure with hypercapnia: Secondary | ICD-10-CM | POA: Diagnosis not present

## 2022-02-10 DIAGNOSIS — J9601 Acute respiratory failure with hypoxia: Secondary | ICD-10-CM | POA: Diagnosis not present

## 2022-02-10 DIAGNOSIS — G931 Anoxic brain damage, not elsewhere classified: Secondary | ICD-10-CM | POA: Diagnosis not present

## 2022-02-10 DIAGNOSIS — Z8673 Personal history of transient ischemic attack (TIA), and cerebral infarction without residual deficits: Secondary | ICD-10-CM | POA: Diagnosis not present

## 2022-02-11 ENCOUNTER — Inpatient Hospital Stay (HOSPITAL_COMMUNITY): Payer: Medicare Other

## 2022-02-11 ENCOUNTER — Emergency Department (HOSPITAL_COMMUNITY): Payer: Medicare Other

## 2022-02-11 ENCOUNTER — Encounter (HOSPITAL_COMMUNITY): Payer: Self-pay | Admitting: Emergency Medicine

## 2022-02-11 ENCOUNTER — Other Ambulatory Visit: Payer: Self-pay

## 2022-02-11 ENCOUNTER — Inpatient Hospital Stay (HOSPITAL_COMMUNITY)
Admission: EM | Admit: 2022-02-11 | Discharge: 2022-02-17 | DRG: 564 | Disposition: A | Discharge status: Home Health | Payer: Medicare Other | Source: Skilled Nursing Facility | Attending: Internal Medicine | Admitting: Internal Medicine

## 2022-02-11 DIAGNOSIS — Z87891 Personal history of nicotine dependence: Secondary | ICD-10-CM | POA: Diagnosis not present

## 2022-02-11 DIAGNOSIS — J9601 Acute respiratory failure with hypoxia: Secondary | ICD-10-CM | POA: Diagnosis present

## 2022-02-11 DIAGNOSIS — I2582 Chronic total occlusion of coronary artery: Secondary | ICD-10-CM | POA: Diagnosis present

## 2022-02-11 DIAGNOSIS — I5032 Chronic diastolic (congestive) heart failure: Secondary | ICD-10-CM | POA: Diagnosis not present

## 2022-02-11 DIAGNOSIS — I110 Hypertensive heart disease with heart failure: Secondary | ICD-10-CM | POA: Diagnosis present

## 2022-02-11 DIAGNOSIS — Z20822 Contact with and (suspected) exposure to covid-19: Secondary | ICD-10-CM | POA: Diagnosis not present

## 2022-02-11 DIAGNOSIS — R0781 Pleurodynia: Secondary | ICD-10-CM | POA: Diagnosis not present

## 2022-02-11 DIAGNOSIS — Z8249 Family history of ischemic heart disease and other diseases of the circulatory system: Secondary | ICD-10-CM

## 2022-02-11 DIAGNOSIS — D630 Anemia in neoplastic disease: Secondary | ICD-10-CM | POA: Diagnosis present

## 2022-02-11 DIAGNOSIS — S2220XA Unspecified fracture of sternum, initial encounter for closed fracture: Secondary | ICD-10-CM | POA: Diagnosis present

## 2022-02-11 DIAGNOSIS — M96A1 Fracture of sternum associated with chest compression and cardiopulmonary resuscitation: Secondary | ICD-10-CM | POA: Diagnosis not present

## 2022-02-11 DIAGNOSIS — I469 Cardiac arrest, cause unspecified: Secondary | ICD-10-CM | POA: Diagnosis not present

## 2022-02-11 DIAGNOSIS — R933 Abnormal findings on diagnostic imaging of other parts of digestive tract: Secondary | ICD-10-CM

## 2022-02-11 DIAGNOSIS — K7689 Other specified diseases of liver: Secondary | ICD-10-CM | POA: Diagnosis not present

## 2022-02-11 DIAGNOSIS — C779 Secondary and unspecified malignant neoplasm of lymph node, unspecified: Secondary | ICD-10-CM | POA: Diagnosis not present

## 2022-02-11 DIAGNOSIS — R079 Chest pain, unspecified: Secondary | ICD-10-CM

## 2022-02-11 DIAGNOSIS — I2510 Atherosclerotic heart disease of native coronary artery without angina pectoris: Secondary | ICD-10-CM | POA: Diagnosis not present

## 2022-02-11 DIAGNOSIS — Z79891 Long term (current) use of opiate analgesic: Secondary | ICD-10-CM

## 2022-02-11 DIAGNOSIS — J9811 Atelectasis: Secondary | ICD-10-CM | POA: Diagnosis not present

## 2022-02-11 DIAGNOSIS — R0689 Other abnormalities of breathing: Secondary | ICD-10-CM | POA: Diagnosis not present

## 2022-02-11 DIAGNOSIS — R0902 Hypoxemia: Principal | ICD-10-CM

## 2022-02-11 DIAGNOSIS — Z89612 Acquired absence of left leg above knee: Secondary | ICD-10-CM

## 2022-02-11 DIAGNOSIS — I10 Essential (primary) hypertension: Secondary | ICD-10-CM | POA: Diagnosis present

## 2022-02-11 DIAGNOSIS — K8689 Other specified diseases of pancreas: Secondary | ICD-10-CM | POA: Diagnosis not present

## 2022-02-11 DIAGNOSIS — M069 Rheumatoid arthritis, unspecified: Secondary | ICD-10-CM | POA: Diagnosis not present

## 2022-02-11 DIAGNOSIS — R846 Abnormal cytological findings in specimens from respiratory organs and thorax: Secondary | ICD-10-CM | POA: Diagnosis not present

## 2022-02-11 DIAGNOSIS — S2243XD Multiple fractures of ribs, bilateral, subsequent encounter for fracture with routine healing: Secondary | ICD-10-CM

## 2022-02-11 DIAGNOSIS — J189 Pneumonia, unspecified organism: Secondary | ICD-10-CM | POA: Diagnosis present

## 2022-02-11 DIAGNOSIS — C159 Malignant neoplasm of esophagus, unspecified: Secondary | ICD-10-CM | POA: Diagnosis not present

## 2022-02-11 DIAGNOSIS — I25110 Atherosclerotic heart disease of native coronary artery with unstable angina pectoris: Secondary | ICD-10-CM | POA: Diagnosis not present

## 2022-02-11 DIAGNOSIS — M96A3 Multiple fractures of ribs associated with chest compression and cardiopulmonary resuscitation: Principal | ICD-10-CM | POA: Diagnosis present

## 2022-02-11 DIAGNOSIS — K219 Gastro-esophageal reflux disease without esophagitis: Secondary | ICD-10-CM | POA: Diagnosis present

## 2022-02-11 DIAGNOSIS — Z86718 Personal history of other venous thrombosis and embolism: Secondary | ICD-10-CM | POA: Diagnosis not present

## 2022-02-11 DIAGNOSIS — R772 Abnormality of alphafetoprotein: Secondary | ICD-10-CM | POA: Diagnosis present

## 2022-02-11 DIAGNOSIS — K760 Fatty (change of) liver, not elsewhere classified: Secondary | ICD-10-CM | POA: Diagnosis not present

## 2022-02-11 DIAGNOSIS — S2242XA Multiple fractures of ribs, left side, initial encounter for closed fracture: Secondary | ICD-10-CM | POA: Diagnosis present

## 2022-02-11 DIAGNOSIS — D649 Anemia, unspecified: Secondary | ICD-10-CM | POA: Diagnosis not present

## 2022-02-11 DIAGNOSIS — G4700 Insomnia, unspecified: Secondary | ICD-10-CM | POA: Diagnosis present

## 2022-02-11 DIAGNOSIS — R069 Unspecified abnormalities of breathing: Secondary | ICD-10-CM | POA: Diagnosis not present

## 2022-02-11 DIAGNOSIS — R091 Pleurisy: Secondary | ICD-10-CM | POA: Diagnosis not present

## 2022-02-11 DIAGNOSIS — I82409 Acute embolism and thrombosis of unspecified deep veins of unspecified lower extremity: Secondary | ICD-10-CM | POA: Diagnosis present

## 2022-02-11 DIAGNOSIS — J910 Malignant pleural effusion: Secondary | ICD-10-CM | POA: Diagnosis not present

## 2022-02-11 DIAGNOSIS — J90 Pleural effusion, not elsewhere classified: Secondary | ICD-10-CM | POA: Diagnosis not present

## 2022-02-11 DIAGNOSIS — R160 Hepatomegaly, not elsewhere classified: Secondary | ICD-10-CM | POA: Diagnosis not present

## 2022-02-11 DIAGNOSIS — Z885 Allergy status to narcotic agent status: Secondary | ICD-10-CM

## 2022-02-11 DIAGNOSIS — I2782 Chronic pulmonary embolism: Secondary | ICD-10-CM | POA: Diagnosis not present

## 2022-02-11 DIAGNOSIS — Z743 Need for continuous supervision: Secondary | ICD-10-CM | POA: Diagnosis not present

## 2022-02-11 DIAGNOSIS — I517 Cardiomegaly: Secondary | ICD-10-CM | POA: Diagnosis not present

## 2022-02-11 DIAGNOSIS — Z7902 Long term (current) use of antithrombotics/antiplatelets: Secondary | ICD-10-CM

## 2022-02-11 DIAGNOSIS — I252 Old myocardial infarction: Secondary | ICD-10-CM | POA: Diagnosis not present

## 2022-02-11 DIAGNOSIS — R911 Solitary pulmonary nodule: Secondary | ICD-10-CM | POA: Diagnosis not present

## 2022-02-11 DIAGNOSIS — I739 Peripheral vascular disease, unspecified: Secondary | ICD-10-CM | POA: Diagnosis present

## 2022-02-11 DIAGNOSIS — I2699 Other pulmonary embolism without acute cor pulmonale: Secondary | ICD-10-CM | POA: Diagnosis present

## 2022-02-11 DIAGNOSIS — I700 Atherosclerosis of aorta: Secondary | ICD-10-CM | POA: Diagnosis not present

## 2022-02-11 DIAGNOSIS — R932 Abnormal findings on diagnostic imaging of liver and biliary tract: Secondary | ICD-10-CM | POA: Diagnosis not present

## 2022-02-11 DIAGNOSIS — R4182 Altered mental status, unspecified: Secondary | ICD-10-CM | POA: Diagnosis not present

## 2022-02-11 DIAGNOSIS — J918 Pleural effusion in other conditions classified elsewhere: Secondary | ICD-10-CM | POA: Diagnosis present

## 2022-02-11 DIAGNOSIS — C229 Malignant neoplasm of liver, not specified as primary or secondary: Secondary | ICD-10-CM | POA: Diagnosis present

## 2022-02-11 DIAGNOSIS — R509 Fever, unspecified: Secondary | ICD-10-CM | POA: Diagnosis not present

## 2022-02-11 DIAGNOSIS — D509 Iron deficiency anemia, unspecified: Secondary | ICD-10-CM | POA: Diagnosis not present

## 2022-02-11 DIAGNOSIS — K859 Acute pancreatitis without necrosis or infection, unspecified: Secondary | ICD-10-CM | POA: Diagnosis not present

## 2022-02-11 DIAGNOSIS — Z888 Allergy status to other drugs, medicaments and biological substances status: Secondary | ICD-10-CM

## 2022-02-11 HISTORY — DX: Acute respiratory failure with hypoxia: J96.01

## 2022-02-11 LAB — COMPREHENSIVE METABOLIC PANEL
ALT: 43 U/L (ref 0–44)
AST: 29 U/L (ref 15–41)
Albumin: 2 g/dL — ABNORMAL LOW (ref 3.5–5.0)
Alkaline Phosphatase: 195 U/L — ABNORMAL HIGH (ref 38–126)
Anion gap: 11 (ref 5–15)
BUN: 18 mg/dL (ref 8–23)
CO2: 23 mmol/L (ref 22–32)
Calcium: 7.8 mg/dL — ABNORMAL LOW (ref 8.9–10.3)
Chloride: 100 mmol/L (ref 98–111)
Creatinine, Ser: 1.15 mg/dL (ref 0.61–1.24)
GFR, Estimated: >60 mL/min (ref >60)
Glucose, Bld: 92 mg/dL (ref 70–99)
Potassium: 3.3 mmol/L — ABNORMAL LOW (ref 3.5–5.1)
Sodium: 134 mmol/L — ABNORMAL LOW (ref 135–145)
Total Bilirubin: 0.5 mg/dL (ref 0.3–1.2)
Total Protein: 6.1 g/dL — ABNORMAL LOW (ref 6.5–8.1)

## 2022-02-11 LAB — CBC
HCT: 32.2 % — ABNORMAL LOW (ref 39.0–52.0)
Hemoglobin: 10.1 g/dL — ABNORMAL LOW (ref 13.0–17.0)
MCH: 25.8 pg — ABNORMAL LOW (ref 26.0–34.0)
MCHC: 31.4 g/dL (ref 30.0–36.0)
MCV: 82.4 fL (ref 80.0–100.0)
Platelets: 352 10*3/uL (ref 150–400)
RBC: 3.91 MIL/uL — ABNORMAL LOW (ref 4.22–5.81)
RDW: 18.3 % — ABNORMAL HIGH (ref 11.5–15.5)
WBC: 12.9 10*3/uL — ABNORMAL HIGH (ref 4.0–10.5)
nRBC: 0 % (ref 0.0–0.2)

## 2022-02-11 LAB — IRON AND TIBC
Iron: 13 ug/dL — ABNORMAL LOW (ref 45–182)
Saturation Ratios: 5 % — ABNORMAL LOW (ref 17.9–39.5)
TIBC: 270 ug/dL (ref 250–450)
UIBC: 257 ug/dL

## 2022-02-11 LAB — LACTATE DEHYDROGENASE: LDH: 264 U/L — ABNORMAL HIGH (ref 98–192)

## 2022-02-11 LAB — BODY FLUID CELL COUNT WITH DIFFERENTIAL
Eos, Fluid: 1 %
Lymphs, Fluid: 77 %
Monocyte-Macrophage-Serous Fluid: 4 % — ABNORMAL LOW (ref 50–90)
Neutrophil Count, Fluid: 18 % (ref 0–25)
Total Nucleated Cell Count, Fluid: 1009 cu mm — ABNORMAL HIGH (ref 0–1000)

## 2022-02-11 LAB — GRAM STAIN

## 2022-02-11 LAB — VITAMIN B12: Vitamin B-12: 1438 pg/mL — ABNORMAL HIGH (ref 180–914)

## 2022-02-11 LAB — LACTATE DEHYDROGENASE, PLEURAL OR PERITONEAL FLUID: LD, Fluid: 248 U/L — ABNORMAL HIGH (ref 3–23)

## 2022-02-11 LAB — TROPONIN I (HIGH SENSITIVITY)
Troponin I (High Sensitivity): 1129 ng/L (ref <18)
Troponin I (High Sensitivity): 1160 ng/L (ref <18)
Troponin I (High Sensitivity): 933 ng/L (ref <18)
Troponin I (High Sensitivity): 957 ng/L (ref <18)

## 2022-02-11 LAB — RETICULOCYTES
Immature Retic Fract: 17.3 % — ABNORMAL HIGH (ref 2.3–15.9)
RBC.: 3.7 MIL/uL — ABNORMAL LOW (ref 4.22–5.81)
Retic Count, Absolute: 91.4 10*3/uL (ref 19.0–186.0)
Retic Ct Pct: 2.5 % (ref 0.4–3.1)

## 2022-02-11 LAB — PROCALCITONIN: Procalcitonin: 1.26 ng/mL

## 2022-02-11 LAB — SARS CORONAVIRUS 2 BY RT PCR: SARS Coronavirus 2 by RT PCR: NEGATIVE

## 2022-02-11 LAB — FERRITIN: Ferritin: 151 ng/mL (ref 24–336)

## 2022-02-11 LAB — PROTEIN, PLEURAL OR PERITONEAL FLUID: Total protein, fluid: <3 g/dL

## 2022-02-11 LAB — PROTIME-INR
INR: 1.5 — ABNORMAL HIGH (ref 0.8–1.2)
Prothrombin Time: 18.2 seconds — ABNORMAL HIGH (ref 11.4–15.2)

## 2022-02-11 LAB — HEPATITIS C ANTIBODY: HCV Ab: NONREACTIVE

## 2022-02-11 LAB — BRAIN NATRIURETIC PEPTIDE: B Natriuretic Peptide: 330 pg/mL — ABNORMAL HIGH (ref 0.0–100.0)

## 2022-02-11 MED ORDER — METOPROLOL TARTRATE 12.5 MG HALF TABLET
12.5000 mg | ORAL_TABLET | Freq: Two times a day (BID) | ORAL | Status: DC
  Administered 2022-02-11 – 2022-02-17 (×13): 12.5 mg via ORAL
  Filled 2022-02-11 (×13): qty 1

## 2022-02-11 MED ORDER — POLYETHYLENE GLYCOL 3350 17 G PO PACK
17.0000 g | PACK | Freq: Every day | ORAL | Status: DC | PRN
  Administered 2022-02-13 – 2022-02-16 (×2): 17 g via ORAL
  Filled 2022-02-11 (×3): qty 1

## 2022-02-11 MED ORDER — OXYCODONE HCL 5 MG PO TABS
5.0000 mg | ORAL_TABLET | ORAL | Status: DC | PRN
  Administered 2022-02-11 – 2022-02-15 (×17): 5 mg via ORAL
  Filled 2022-02-11 (×17): qty 1

## 2022-02-11 MED ORDER — CLOPIDOGREL BISULFATE 75 MG PO TABS: 75.0000 mg | ORAL_TABLET | Freq: Every day | ORAL | Status: DC

## 2022-02-11 MED ORDER — FENTANYL CITRATE PF 50 MCG/ML IJ SOSY
50.0000 ug | PREFILLED_SYRINGE | Freq: Once | INTRAMUSCULAR | Status: AC
  Administered 2022-02-11: 50 ug via INTRAVENOUS
  Filled 2022-02-11: qty 1

## 2022-02-11 MED ORDER — ATORVASTATIN CALCIUM 80 MG PO TABS
80.0000 mg | ORAL_TABLET | Freq: Every day | ORAL | Status: DC
  Administered 2022-02-11 – 2022-02-16 (×6): 80 mg via ORAL
  Filled 2022-02-11: qty 2
  Filled 2022-02-11 (×5): qty 1

## 2022-02-11 MED ORDER — SODIUM CHLORIDE 0.9 % IV SOLN
2.0000 g | Freq: Once | INTRAVENOUS | Status: AC
  Administered 2022-02-11: 2 g via INTRAVENOUS
  Filled 2022-02-11: qty 20

## 2022-02-11 MED ORDER — ENOXAPARIN SODIUM 100 MG/ML IJ SOSY
1.0000 mg/kg | PREFILLED_SYRINGE | Freq: Two times a day (BID) | INTRAMUSCULAR | Status: DC
  Administered 2022-02-11 – 2022-02-12 (×2): 85 mg via SUBCUTANEOUS
  Filled 2022-02-11 (×2): qty 1

## 2022-02-11 MED ORDER — FUROSEMIDE 10 MG/ML IJ SOLN
20.0000 mg | Freq: Once | INTRAMUSCULAR | Status: AC
Start: 2022-02-11 — End: 2022-02-11
  Administered 2022-02-11: 20 mg via INTRAVENOUS
  Filled 2022-02-11: qty 2

## 2022-02-11 MED ORDER — APIXABAN 5 MG PO TABS: 5.0000 mg | ORAL_TABLET | Freq: Two times a day (BID) | ORAL | Status: DC

## 2022-02-11 MED ORDER — IOHEXOL 300 MG/ML  SOLN
75.0000 mL | Freq: Once | INTRAMUSCULAR | Status: AC | PRN
  Administered 2022-02-11: 75 mL via INTRAVENOUS

## 2022-02-11 MED ORDER — OXYCODONE HCL 5 MG PO TABS: 5.0000 mg | ORAL_TABLET | Freq: Four times a day (QID) | ORAL | Status: DC | PRN

## 2022-02-11 MED ORDER — OXYCODONE-ACETAMINOPHEN 5-325 MG PO TABS
1.0000 | ORAL_TABLET | ORAL | Status: DC | PRN
  Administered 2022-02-11 – 2022-02-16 (×23): 1 via ORAL
  Filled 2022-02-11 (×23): qty 1

## 2022-02-11 MED ORDER — IOHEXOL 350 MG/ML SOLN
100.0000 mL | Freq: Once | INTRAVENOUS | Status: AC | PRN
  Administered 2022-02-11: 100 mL via INTRAVENOUS

## 2022-02-11 MED ORDER — VARENICLINE TARTRATE 1 MG PO TABS
1.0000 mg | ORAL_TABLET | Freq: Two times a day (BID) | ORAL | Status: DC
  Administered 2022-02-13 – 2022-02-17 (×7): 1 mg via ORAL
  Filled 2022-02-11 (×13): qty 1

## 2022-02-11 MED ORDER — ACETAMINOPHEN 650 MG RE SUPP: 650.0000 mg | Freq: Four times a day (QID) | RECTAL | Status: DC | PRN

## 2022-02-11 MED ORDER — SODIUM CHLORIDE 0.9 % IV SOLN
500.0000 mg | INTRAVENOUS | Status: DC
  Administered 2022-02-11 – 2022-02-12 (×2): 500 mg via INTRAVENOUS
  Filled 2022-02-11 (×2): qty 5

## 2022-02-11 MED ORDER — LIDOCAINE 5 % EX PTCH
1.0000 | MEDICATED_PATCH | CUTANEOUS | Status: DC
  Administered 2022-02-11 – 2022-02-17 (×7): 1 via TRANSDERMAL
  Filled 2022-02-11 (×7): qty 1

## 2022-02-11 MED ORDER — OXYCODONE-ACETAMINOPHEN 5-325 MG PO TABS
1.0000 | ORAL_TABLET | Freq: Four times a day (QID) | ORAL | Status: DC | PRN
  Administered 2022-02-11: 1 via ORAL
  Filled 2022-02-11: qty 1

## 2022-02-11 MED ORDER — POTASSIUM CHLORIDE CRYS ER 20 MEQ PO TBCR
40.0000 meq | EXTENDED_RELEASE_TABLET | Freq: Two times a day (BID) | ORAL | Status: AC
Start: 2022-02-11 — End: 2022-02-11
  Administered 2022-02-11 (×2): 40 meq via ORAL
  Filled 2022-02-11 (×2): qty 2

## 2022-02-11 MED ORDER — LORAZEPAM 2 MG/ML IJ SOLN
0.5000 mg | Freq: Once | INTRAMUSCULAR | Status: AC
  Administered 2022-02-11: 0.5 mg via INTRAVENOUS
  Filled 2022-02-11: qty 1

## 2022-02-11 MED ORDER — GADOBUTROL 1 MMOL/ML IV SOLN
8.0000 mL | Freq: Once | INTRAVENOUS | Status: AC | PRN
Start: 2022-02-11 — End: 2022-02-11
  Administered 2022-02-11: 8 mL via INTRAVENOUS

## 2022-02-11 MED ORDER — ACETAMINOPHEN 325 MG PO TABS: 650.0000 mg | ORAL_TABLET | Freq: Four times a day (QID) | ORAL | Status: DC | PRN

## 2022-02-11 MED ORDER — OXYCODONE-ACETAMINOPHEN 10-325 MG PO TABS: 1.0000 | ORAL_TABLET | Freq: Four times a day (QID) | ORAL | Status: DC | PRN

## 2022-02-11 MED ORDER — LIDOCAINE HCL (PF) 1 % IJ SOLN
INTRAMUSCULAR | Status: AC
  Filled 2022-02-11: qty 30

## 2022-02-11 MED ORDER — CEFTRIAXONE SODIUM 2 G IJ SOLR
2.0000 g | Freq: Once | INTRAMUSCULAR | Status: AC
  Administered 2022-02-12: 2 g via INTRAVENOUS
  Filled 2022-02-11: qty 20

## 2022-02-11 MED ORDER — GABAPENTIN 300 MG PO CAPS
300.0000 mg | ORAL_CAPSULE | Freq: Two times a day (BID) | ORAL | Status: DC
Start: 2022-02-11 — End: 2022-02-16
  Administered 2022-02-11 – 2022-02-16 (×8): 300 mg via ORAL
  Filled 2022-02-11 (×11): qty 1

## 2022-02-11 MED ORDER — DULOXETINE HCL 30 MG PO CPEP
30.0000 mg | ORAL_CAPSULE | Freq: Every day | ORAL | Status: DC
  Administered 2022-02-11 – 2022-02-16 (×5): 30 mg via ORAL
  Filled 2022-02-11 (×7): qty 1

## 2022-02-11 NOTE — ED Notes (Signed)
[name] [name] friend [phone] requesting an update/speak to the patient

## 2022-02-11 NOTE — ED Notes (Signed)
ED TO INPATIENT HANDOFF REPORT  S Name/Age/Gender [name] [name] 61 y.o. male Room/Bed: TRABC/TRABC  Code Status : Full Code  Came from [facility]  Alert and oriented x4   Chief Complaint : SOB  Acute respiratory failure with hypoxemia ([place]) [J96.01]  Triage Note Pt in from [place] with sob, sats initially 80% on RA per staff. EMS briefly placed on NRB, and pt now arrives on [facility] at 96%. Reporting rib pain at fracture sites from CPR, suffered cardiac arrest 10 days ago. Pt reports productive cough, RR 36    Allergies Allergies  Allergen Reactions   Dilaudid [Hydromorphone Hcl] Rash   Ramipril Rash and Other (See Comments)    Per patient on R arm and leg only; no angioedema   Tramadol Itching and Rash    Level of Care/Admitting Diagnosis ED Disposition     ED Disposition  Admit   Condition  --   [name] [name]: [name] [name] [[id number]]  Level of Care: Progressive [102]  Admit to Progressive based on following criteria: RESPIRATORY PROBLEMS hypoxemic/hypercapnic respiratory failure that is responsive to NIPPV (BiPAP) or High Flow Nasal Cannula (6-80 lpm). Frequent assessment/intervention, no > Q2 hrs < Q4 hrs, to maintain oxygenation and pulmonary hygiene.  May admit patient to [facility] or [facility] if equivalent level of care is available:: No  Covid Evaluation: Symptomatic Person Under Investigation (PUI) or recent exposure (last 10 days) *Testing Required*  Diagnosis: Acute respiratory failure with hypoxemia [facility]) [[id number]]  Admitting Physician: Rise Patience [phone]  Attending Physician: Rise Patience [[id number]]  Certification:: I certify this patient will need inpatient services for at least 2 midnights  Estimated Length of Stay: 2          B Medical/Surgery History Past Medical History:  Diagnosis Date   Anemia    Anemia 08/06/2012   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   Hypertension    states is borderline on medication; has been on med. x 5-6 yr.   Hypokalemia 05/30/2018   Impingement syndrome of shoulder region 10/2014   left   Insomnia 06/01/2015   Ischemic ulcer of toe of left foot (HCC)    Left shoulder pain 12/16/2015   Low back pain without sciatica 10/29/2009   Lupus ([place])    Peripheral vascular disease (HCC)    Pneumonia    RA (rheumatoid arthritis) ([place])    Rotator cuff tear 11/04/2014   Past Surgical History:  Procedure Laterality Date   ABDOMINAL AORTAGRAM  02/20/2019   ABDOMINAL AORTOGRAM W/LOWER EXTREMITY N/A 02/20/2019   Procedure: ABDOMINAL AORTOGRAM W/LOWER EXTREMITY;  Surgeon: Marty Heck, MD;  Location: [place] [facility];  Service: Cardiovascular;  Laterality: N/A;   ACHILLES TENDON SURGERY Bilateral    AMPUTATION Left 05/11/2020   Procedure: LEFT ABOVE KNEE AMPUTATION;  Surgeon: Angelia Mould, MD;  Location: [place];  Service: Vascular;  Laterality: Left;   ENDARTERECTOMY POPLITEAL Left 05/01/2020   Procedure: ENDARTERECTOMY POPLITEAL;  Surgeon: Waynetta Sandy, MD;  Location: [place];  Service: Vascular;  Laterality: Left;   FASCIOTOMY Left 05/01/2020   Procedure: FASCIOTOMY;  Surgeon: Waynetta Sandy, MD;  Location: [place];  Service: Vascular;  Laterality: Left;  FEMORAL-POPLITEAL BYPASS GRAFT Left 02/26/2019   Procedure: BYPASS GRAFT FEMORAL-POPLITEAL ARTERY LEFT LEG USING 81m PROPATEN GRAFT;  Surgeon: BSerafina Mitchell MD;  [place]  Service: Vascular;  Laterality: Left;   INTRAOPERATIVE ARTERIOGRAM Left 01/22/2020   Procedure: INTRA OPERATIVE ARTERIOGRAM with administration of thrombolyics in Left femoral to popliteal bypass.;  Surgeon: CMarty Heck MD;  [place]  Service: Vascular;  Laterality: Left;   LEFT  HEART CATH AND CORONARY ANGIOGRAPHY N/A 03/15/2017   Procedure: LEFT HEART CATH AND CORONARY ANGIOGRAPHY;  Surgeon: ENelva Bush MD;  [place] [facility];  Service: Cardiovascular;  Laterality: N/A;   LEFT HEART CATH AND CORONARY ANGIOGRAPHY N/A 02/06/2022   Procedure: LEFT HEART CATH AND CORONARY ANGIOGRAPHY;  Surgeon: JMartinique Peter M, MD;  [place] [facility];  Service: Cardiovascular;  Laterality: N/A;   LOWER EXTREMITY INTERVENTION Left 04/29/2020   Procedure: LOWER EXTREMITY INTERVENTION- LYSIS;  Surgeon: CMarty Heck MD;  [place] [facility];  Service: Cardiovascular;  Laterality: Left;   OSTEOTOMY AND ULNAR SHORTENING Right 07/22/2002   PATCH ANGIOPLASTY Left 05/01/2020   Procedure: PATCH ANGIOPLASTY USING HEMASHIELD PLATINUM FINESSE PATCH;  Surgeon: CWaynetta Sandy MD;  [place]  Service: Vascular;  Laterality: Left;   PERIPHERAL VASCULAR ATHERECTOMY  01/23/2020   Procedure: PERIPHERAL VASCULAR ATHERECTOMY;  Surgeon: CMarty Heck MD;  [facility];  Service: Cardiovascular;;   PERIPHERAL VASCULAR BALLOON ANGIOPLASTY  01/23/2020   Procedure: PERIPHERAL VASCULAR BALLOON ANGIOPLASTY;  Surgeon: CMarty Heck MD;  [place];  Service: Cardiovascular;;   PERIPHERAL VASCULAR INTERVENTION Left 04/30/2020   Procedure: PERIPHERAL VASCULAR INTERVENTION;  Surgeon: HCherre Robins MD;  [place];  Service: Cardiovascular;  Laterality: Left;   PERIPHERAL VASCULAR THROMBECTOMY N/A 01/23/2020   Procedure: lysis recheck;  Surgeon: CMarty Heck MD;  [place] [facility];  Service: Cardiovascular;  Laterality: N/A;  + [place]    PERIPHERAL VASCULAR THROMBECTOMY N/A 04/30/2020   Procedure: Lysis Recheck;  Surgeon: HCherre Robins MD;  [place] [facility];  Service: Cardiovascular;  Laterality: N/A;   SHOULDER ARTHROSCOPY WITH BICEPSTENOTOMY Right 03/11/2015    Procedure: SHOULDER ARTHROSCOPY WITH BICEPSTENOTOMY;  Surgeon: DNinetta Lights MD;  [place]  Service: Orthopedics;  Laterality: Right;   SHOULDER ARTHROSCOPY WITH DISTAL CLAVICLE RESECTION Left 11/19/2014   Procedure: SHOULDER ARTHROSCOPY WITH DISTAL CLAVICLE RESECTION;  Surgeon: DKathryne Hitch MD;  [place]  Service: Orthopedics;  Laterality: Left;   SHOULDER ARTHROSCOPY WITH ROTATOR CUFF REPAIR AND SUBACROMIAL DECOMPRESSION Left 11/19/2014   Procedure: LEFT SHOULDER ARTHROSCOPY, DEBRIDEMENT DISTAL CLAVICLE EXCISION, ACROMIOPLASTY WITH ROTATOR CUFF REPAIR ;  Surgeon: DKathryne Hitch MD;  [place]  Service: Orthopedics;  Laterality: Left;   THROMBECTOMY OF BYPASS GRAFT FEMORAL- POPLITEAL ARTERY Left 05/01/2020   Procedure: THROMBECTOMY OF LOWER EXTREMITY;  Surgeon: CWaynetta Sandy MD;  [place]  Service: Vascular;  Laterality: Left;   TRANSFORAMINAL LUMBAR INTERBODY FUSION (TLIF) WITH PEDICLE SCREW FIXATION 1 LEVEL N/A 01/03/2018   Procedure: TRANSFORAMINAL LUMBAR INTERBODY FUSION (TLIF) LUMBAR FIVE-SACRAL ONE;  Surgeon: BMelina Schools MD;  [place]  Service: Orthopedics;  Laterality: N/A;   ULTRASOUND GUIDANCE FOR VASCULAR ACCESS Right 01/22/2020   Procedure: ULTRASOUND GUIDANCE FOR VASCULAR ACCESS Right Common Femoral Artery.;  Surgeon: CMarty Heck MD;  [place]  Service: Vascular;  Laterality: Right;  VIDEO ASSISTED THORACOSCOPY (VATS)/DECORTICATION Right 11/21/2011   drainage of empyema     A IV Location/Drains/Wounds Patient Lines/Drains/Airways Status     Active Line/Drains/Airways     Name Placement date Placement time Site Days   Peripheral IV 02/11/22 20 G Left Antecubital 02/11/22  0152  Antecubital  less than 1   Incision (Closed) 01/22/20 Groin 01/22/20  2341  -- 751   Incision (Closed) 05/01/20 Leg Left 05/01/20  1330  -- 651   Incision (Closed) 05/11/20  Leg 05/11/20  1121  -- 641   Pressure Injury 01/31/22 Buttocks Right Stage 1 -  Intact skin with non-blanchable redness of a localized area usually over a bony prominence. 01/31/22  1615  -- 11   Wound / Incision (Open or Dehisced) 02/19/19 Non-pressure wound Toe (Comment  which one) Left 02/19/19  2003  Toe (Comment  which one)  1088   Wound / Incision (Open or Dehisced) 02/19/19 Non-pressure wound Leg Right;Anterior;Lower 02/19/19  2000  Leg  1088            Intake/Output Last 24 hours  Intake/Output Summary (Last 24 hours) at 02/11/2022 0554 Last data filed at 02/11/2022 0409 Gross per 24 hour  Intake 350 ml  Output --  Net 350 ml    Labs/Imaging Results for orders placed or performed during the hospital encounter of 02/11/22 (from the past 48 hour(s))  CBC     Status: Abnormal   Collection Time: 02/11/22  1:51 AM  Result Value Ref Range   WBC 12.9 (H) 4.0 - 10.5 K/uL   RBC 3.91 (L) 4.22 - 5.81 MIL/uL   Hemoglobin 10.1 (L) 13.0 - 17.0 g/dL   HCT 32.2 (L) 39.0 - 52.0 %   MCV 82.4 80.0 - 100.0 fL   MCH 25.8 (L) 26.0 - 34.0 pg   MCHC 31.4 30.0 - 36.0 g/dL   RDW 18.3 (H) 11.5 - 15.5 %   Platelets 352 150 - 400 K/uL   nRBC 0.0 0.0 - 0.2 %    Comment: Performed at [facility], [address] [place], [place] [address]  Comprehensive metabolic panel     Status: Abnormal   Collection Time: 02/11/22  1:51 AM  Result Value Ref Range   Sodium 134 (L) 135 - 145 mmol/L   Potassium 3.3 (L) 3.5 - 5.1 mmol/L   Chloride 100 98 - 111 mmol/L   CO2 23 22 - 32 mmol/L   Glucose, Bld 92 70 - 99 mg/dL    Comment: Glucose reference range applies only to samples taken after fasting for at least 8 hours.   BUN 18 8 - 23 mg/dL   Creatinine, Ser 1.15 0.61 - 1.24 mg/dL   Calcium 7.8 (L) 8.9 - 10.3 mg/dL   Total Protein 6.1 (L) 6.5 - 8.1 g/dL   Albumin 2.0 (L) 3.5 - 5.0 g/dL   AST 29 15 - 41 U/L   ALT 43 0 - 44 U/L   Alkaline Phosphatase 195 (H) 38 - 126 U/L   Total Bilirubin 0.5 0.3  - 1.2 mg/dL   GFR, Estimated >60 >60 mL/min    Comment: (NOTE) Calculated using the CKD-EPI Creatinine Equation (2021)    Anion gap 11 5 - 15    Comment: Performed at [facility], [place] [address], [place], [place] [address]  Troponin I (High Sensitivity)     Status: Abnormal   Collection Time: 02/11/22  1:51 AM  Result Value Ref Range  Troponin I (High Sensitivity) 1,129 (HH) <18 ng/L    Comment: CRITICAL RESULT CALLED TO, READ BACK BY AND VERIFIED WITH Jeremih Dearmas T. RN AT [phone] BY RAMEL CUENCA (NOTE) Elevated high sensitivity troponin I (hsTnI) values and significant  changes across serial measurements may suggest ACS but many other  chronic and acute conditions are known to elevate hsTnI results.  Refer to the "Links" section for chest pain algorithms and additional  guidance. Performed at [facility], [place] [address] [place], [place] [address]   Brain natriuretic peptide     Status: Abnormal   Collection Time: 02/11/22  1:51 AM  Result Value Ref Range   B Natriuretic Peptide 330.0 (H) 0.0 - 100.0 pg/mL    Comment: Performed at [facility] [address] [place], [place] [address]  Troponin I (High Sensitivity)     Status: Abnormal   Collection Time: 02/11/22  4:05 AM  Result Value Ref Range   Troponin I (High Sensitivity) 1,160 (HH) <18 ng/L    Comment: CRITICAL RESULT CALLED TO, READ BACK BY AND VERIFIED WITH B.Soleil Mas, RN [phone] 08.19.23 MRIVET (NOTE) Elevated high sensitivity troponin I (hsTnI) values and significant  changes across serial measurements may suggest ACS but many other  chronic and acute conditions are known to elevate hsTnI results.  Refer to the "Links" section for chest pain algorithms and additional  guidance. Performed at [facility], [place] [address], [place], [place] [address]    DG Chest Port 1 View  Result Date: 02/11/2022 CLINICAL DATA:  Shortness of breath. EXAM: PORTABLE CHEST 1 VIEW COMPARISON:  February 01, 2022 FINDINGS: The endotracheal tube, nasogastric tube and left-sided venous catheter seen on the prior study have been removed. The cardiac silhouette is enlarged and predominant stable, given differences in technique. There is marked severity calcification of the aortic arch. Marked severity atelectasis and/or infiltrate is seen within the retrocardiac region of the left lung base. A small left pleural effusion is also noted. No pneumothorax is identified. The visualized skeletal structures are unremarkable. IMPRESSION: 1. Marked severity left basilar atelectasis and/or infiltrate. 2. Small left pleural effusion. Electronically Signed   By: Virgina Norfolk M.D.   On: 02/11/2022 02:39    Pending Labs Unresulted Labs (From admission, onward)     Start     Ordered   02/11/22 0550  SARS Coronavirus 2 by RT PCR (hospital order, performed in [place] hospital lab) *cepheid single result test* Anterior Nasal Swab  (Tier 2 - Symptomatic/Asymptomatic)  Once,   URGENT        02/11/22 0549            Vitals/Pain Today's Vitals   02/11/22 0430 02/11/22 0515 02/11/22 0528 02/11/22 0529  BP: 103/76 113/76    Pulse: 88 90    Resp: 19 (!) 22    Temp:   (!) 97.4 F (36.3 C)   TempSrc:   Temporal   SpO2: 94% 92%    Weight:      PainSc:    Asleep    Isolation Precautions Airborne and Contact precautions  Medications Medications  azithromycin (ZITHROMAX) 500 mg in sodium chloride 0.9 % 250 mL IVPB (0 mg Intravenous Stopped 02/11/22 0409)  fentaNYL (SUBLIMAZE) injection 50 mcg (50 mcg Intravenous Given 02/11/22 0221)  cefTRIAXone (ROCEPHIN) 2 g in sodium chloride 0.9 % 100 mL IVPB (0 g Intravenous Stopped 02/11/22 0349)  iohexol (OMNIPAQUE) 350 MG/ML injection 100 mL (100 mLs Intravenous Contrast  Given 02/11/22 0507)    Mobility : Wheelchai       R Recommendations: See Admitting Provider Note

## 2022-02-11 NOTE — ED Triage Notes (Signed)
Pt in from [place] facility with sob, sats initially 80% on RA per staff. EMS briefly placed on NRB, and pt now arrives on [facility] at 96%. Reporting rib pain at fracture sites from CPR, suffered cardiac arrest 10 days ago. Pt reports productive cough, RR 36

## 2022-02-11 NOTE — ED Notes (Signed)
Patient transported to Ultrasound 

## 2022-02-11 NOTE — ED Notes (Signed)
Admitting at bedside 

## 2022-02-11 NOTE — ED Notes (Signed)
Patient transported to CT 

## 2022-02-11 NOTE — H&P (Cosign Needed Addendum)
Date: 02/11/2022               Patient Name:  [name] [name] MRN: [id number]  DOB: 1960-12-08 Age / Sex: 61 y.o., male   PCP: Angelique Blonder, DO         Medical Service: Internal Medicine Teaching Service         Attending Physician: Dr. Lucious Groves, DO    First Contact: Dr. Stann Mainland Pager: [phone]  Second Contact: Dr. Collene Gobble Pager: [phone]       After Hours (After 5p/  First Contact Pager: [phone]  weekends / holidays): Second Contact Pager: [phone]   Chief Concern: Shortness of breath  History of Present Illness:   [name] [name] is a 61 year old male with past medical history of rheumatoid arthritis, PAD status post left AKA 2021, CAD, PE and DVT on DOAC, recent cardiac arrest from inferior STEMI who presented to the ED for shortness of breath.  Patient was admitted to the ICU on 8/8 for cardiac arrest secondary to inferior STEMI and PE.  He underwent 35 minutes of CPR and subsequently intubated requiring vasopressor.  Troponin peaked at 7479.  He underwent left heart cath which showed 100% stenosis of the RCA with normal LVEDP.  He was managed medically.  CTA did show acute PE.  His Eliquis was resumed after the heart cath.  At discharge he was given 1 dose of IV Lasix and oral Lasix 40 mg Q48h x 3 doses.   Patient went to [facility] last week.  States that he has been feeling more short of breath in the last few days.  He endorses back pain and bilateral ribs pain post CPR that prevent him from taking deep breaths.  He was able to work with physical therapy but it was very difficult.  Pain is somewhat controlled with oxycodone.  Patient denies fever or chills.  He endorses coughing with sputum and mild wheezing yesterday.  Denies any sick contact or inhalers used.  He denies worsening leg swelling or or weight gain.  Unsure about orthopnea. He denies any chest pain, abdominal pain, nausea, vomiting, constipation, diarrhea, urinary or bowel movement issue.  His  medications was dispensed by SNF.  He is unsure about the names of them but reports taking the Eliquis.  In the ED, patient was normotensive, requiring 3 L [place] to maintain O2 sat 95%.  CBC showed leukocytosis but improved from last admission.  Hemoglobin stable.  CMP unremarkable except for mild hypokalemia and elevated alk phos.  BNP was mildly elevated.  Troponin was elevated at 1126 - 1160 but flat.  EKG did not reveal any new ST elevation.  Chest x-ray showed left sided atelectasis with pleural effusion.  CTA shows stable PE with L>R small to moderate pleural effusion with bibasilar collapse/consolidation left greater than right. .  Patient was started on ceftriaxone and azithromycin.  Meds:  No outpatient medications have been marked as taking for the 02/11/22 encounter [facility] Encounter).     Allergies: Allergies as of 02/11/2022 - Review Complete 02/11/2022  Allergen Reaction Noted   Dilaudid [hydromorphone hcl] Rash 05/08/2020   Ramipril Rash and Other (See Comments) 06/23/2011   Tramadol Itching and Rash 06/23/2011   Past Medical History:  Diagnosis Date   Anemia    Anemia 08/06/2012   Aortic valve mass 12/30/2015   Community acquired pneumonia 11/07/2011   DDD (degenerative disc disease), lumbosacral     and grade 2 slip   GERD (gastroesophageal  reflux disease)    Heart murmur    History of anemia    no current med.   Hypertension    states is borderline on medication; has been on med. x 5-6 yr.   Hypokalemia 05/30/2018   Impingement syndrome of shoulder region 10/2014   left   Insomnia 06/01/2015   Ischemic ulcer of toe of left foot (HCC)    Left shoulder pain 12/16/2015   Low back pain without sciatica 10/29/2009   Lupus ([place])    Peripheral vascular disease (HCC)    Pneumonia    RA (rheumatoid arthritis) ([place])    Rotator cuff tear 11/04/2014    Family History:  Family History  Problem Relation Age of Onset   Heart attack Father 74   Alcohol abuse Father    Aneurysm  Mother    Stroke Neg Hx    Cancer Neg Hx      Social History:  Currently staying at [place] He has family members around but does not want me to update anyone Denies alcohol use Quit smoking 4-5 months ago.  Used to smoke 1 pack a week Denies drug use PCP: Dr. Angelique Blonder  Review of Systems: A complete ROS was negative except as per HPI.   Physical Exam: Blood pressure 116/86, pulse 92, temperature (!) 97.4 F (36.3 C), temperature source Temporal, resp. rate (!) 26, weight 79.4 kg, SpO2 97 %. Physical Exam Constitutional:      Comments: Chronically ill appearance.  Tachypneic.  HENT:     Head: Normocephalic.  Eyes:     General:        Right eye: No discharge.        Left eye: No discharge.     Conjunctiva/sclera: Conjunctivae normal.  Cardiovascular:     Rate and Rhythm: Normal rate and regular rhythm.     Heart sounds: Normal heart sounds. No murmur heard.    Comments: Trace edema of his right lower extremity.  JVD to mandible angle Pulmonary:     Comments: Tachypneic.  On 3 L oxygen.  Diminished breath sounds at the left base and midlung.  No wheezing heard. Abdominal:     General: Bowel sounds are normal. There is no distension.     Palpations: Abdomen is soft.     Tenderness: There is no abdominal tenderness.  Skin:    General: Skin is warm.  Neurological:     General: No focal deficit present.     Mental Status: He is alert and oriented to person, place, and time.  Psychiatric:        Mood and Affect: Mood normal.        Behavior: Behavior normal.      EKG: personally reviewed my interpretation is normal sinus rhythm.  Right axis deviation.  Low voltage.  CXR: personally reviewed my interpretation is left-sided atelectasis/infiltrate with small left pleural effusion.  Assessment & Plan by Problem: Principal Problem:   Acute respiratory failure with hypoxemia (HCC) Active Problems:   Rheumatoid arthritis (HCC)   HTN (hypertension)   Coronary artery  disease involving native coronary artery of native heart with unstable angina pectoris (HCC)   PAD (peripheral artery disease) (HCC)   DVT (deep venous thrombosis) (HCC)   Pulmonary emboli (HCC)   Acute respiratory failure with hypoxia (HCC)  [name] [name] is a 61 year old male with past medical history of rheumatoid arthritis, PAD status post left AKA 2021, CAD, PE and DVT on DOAC, recent cardiac arrest from inferior STEMI who  presented to the ED for shortness of breath, admitted for acute respiratory failure with hypoxia secondary to possible CAP vs pleural effusion vs hemothorax  Acute respiratory failure with hypoxemia CAP vs HF exa vs hemothorax Low suspicion for ACS with flat troponin.  His pulm embolism was stable.  Patient is at risk for developing CAP and atelectasis due to shallow breathing from ribs fracture pain.  The pleural effusion was seen bilaterally, left worse than right which could be from infection vs malignancy vs HF vs hemothorax.  Patient has signs of volume overload on exam with JVD and pleural effusion.  BNP mildly elevated.  His weight is however unchanged since last admission. -Trial 1 dose of IV Lasix 20 mg. Strict I/O.  -Continue ceftriaxone and azithromycin.  Check Pro-Cal -IR consult for diagnostic and therapeutic thoracentesis -Hold Eliquis and Plavix until IR evaluates -Trending troponin -Pain control with Percocet as needed  Mass of pancreatic head Liver and esophagogastric junction and LN metastasis disease CT abdomen/pelvis concerning for primary pancreatic adenocarcinoma with metastatic disease to liver, EG junction and lymph node.  This may explain his recent PE. I discussed his findings with GI.  They will evaluate patient and decide on further imaging modality. -Appreciate GI recommendation  Recent cardiac arrest 2/2 inferior STEMI + PE CAD 100% stenosis RCA Hypertension Elevated troponin No evidence of ACS.  Troponin elevated but trending down.  No  new changes on EKG. -Trend troponin to ensure downtrending -Resume atorvastatin, and metoprolol -Hold amlodipine and chlorthalidone due to low normal blood pressure and hypokalemia -Hold Plavix for now  Normocytic anemia Hemoglobin stable -Check reticulocyte count, iron study and B12  History of PE and DVT Acute right popliteal/peroneal/posterior tibial DVT with acute superficial vein thrombosis on 01/10/2022. Acute left lower and right upper lobe pulmonary embolism with right heart strain on 01/31/2022. Suspect PE in the setting of malignancy.  Repeat CTA shows stable PE -Holding Eliquis for possible thoracentesis  5 mm anterior left upper lobe cavitary nodule -Repeat CT in 3 months  PVD status post left AKA 2021 -Resume atorvastatin -Hold Eliquis for now  Rheumatoid arthritis No evidence of acute flare  Tobacco use disorder Continue Chantix  Full code Diet: Heart healthy DVT: SCDs IVF: N/A  Dispo: Admit patient to Inpatient with expected length of stay greater than 2 midnights.  Signed: Gaylan Gerold, DO 02/11/2022, 8:31 AM  Pager: [phone] After 5pm on weekdays and 1pm on weekends: On Call pager: [phone]

## 2022-02-11 NOTE — Progress Notes (Signed)
Noted to hold eliquis per attending and GI.  Pharmacy ordered - notified them that it was to be held.  Notified IMTS as well for possible heparin/lovenox coverage.

## 2022-02-11 NOTE — ED Notes (Signed)
Patient transported to CT, elevated Troponin result reported to EDP.

## 2022-02-11 NOTE — Procedures (Signed)
   Left pleural effusion  US guided Left pleural effusion thoracentesis 300 cc blood tinged fluid Sent for labs per MD Tolerated well  EBL: less than 1 cc  CXR: No PTX

## 2022-02-11 NOTE — Consult Note (Addendum)
Consultation  Referring Provider: [facility]/Hoffman DO Primary Care Physician:  Angelique Blonder, DO Primary Gastroenterologist:  none  Reason for Consultation:  Abnormal imaging of abdomen  HPI: [name] [name] is a 62 y.o. male who presented to the emergency room last evening with shortness of breath.  Patient has been at [facility] after a very recent cardiac arrest on 01/31/2022 secondary to inferior MI and: Chronic PE.  He did require prolonged CPR, intubation and short-term pressors.  Left heart cath showed 100% stenosis of the RCA-treating medically. Has history of rheumatoid arthritis, peripheral arterial disease status post left AKA, coronary artery disease.  He was resumed on Eliquis at discharge.  At this time he had been feeling progressively short of breath over the past couple of days and is complaining of back pain and bilateral anterior chest pain/rib pain with deep breaths On arrival, O2 sat 95 but had been reported at 81 at nursing facility, he is on 3 L nasal cannula. Troponin elevated at 1126-second troponin 1160, EKG did not show any new ST changes. CTA shows stable pulmonary embolism, left lower lobe no mediastinal adenopathy, small to moderate left and small right pleural effusions progressive. Also noted a large liver lesion and therefore CT abdomen pelvis also done showing an 8.7 x 7.9 x 7.4 cm ill-defined irregular mass in the medial segment of the left liver, this appears to be isolated, CTA has shown subtle nodularity of the liver raising question of underlying cirrhosis. No significant intra or extrahepatic ductal dilation, diffuse peripancreatic edema with ill-defined parenchyma in the head of the pancreas measuring 3.0 x 2.9 cm. There is a 2.4 cm necrotic lymph node in the gastrohepatic ligament and a 4.5 x 3.4 cm necrotic mass lesion just cranial to the esophagogastric junction and mild external iliac lymphadenopathy bilateral  calcified para-aortic nodal tissue in the retroperitoneum.  Endings concerning for metastatic disease though primary hepatic neoplasm not entirely excluded MRI suggested.  Patient also had thoracentesis today 300 cc of blood-tinged fluid, counts are pending and fluid was sent for cytology   WBC 12.9/hemoglobin 10.1/hematocrit 32.2/platelets 352 T. bili 0.5/alk phos 195 transaminases normal BNP 330 Potassium 3.3 Ferritin 151, serum iron 13/TIBC 270/iron sat 5 Hep C antibody negative .  Patient says he has not had any prior GI evaluation.  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   Hypertension    states is borderline on medication; has been on med. x 5-6 yr.   Hypokalemia 05/30/2018   Impingement syndrome of shoulder region 10/2014   left   Insomnia 06/01/2015   Ischemic ulcer of toe of left foot ([place])    Left shoulder pain 12/16/2015   Low back pain without sciatica 10/29/2009   Lupus ([place])    Peripheral vascular disease (HCC)    Pneumonia    RA (rheumatoid  arthritis) ([place])    Rotator cuff tear 11/04/2014    Past Surgical History:  Procedure Laterality Date   ABDOMINAL AORTAGRAM  02/20/2019   ABDOMINAL AORTOGRAM W/LOWER EXTREMITY N/A 02/20/2019   Procedure: ABDOMINAL AORTOGRAM W/LOWER EXTREMITY;  Surgeon: Marty Heck, MD;  Location: [facility];  Service: Cardiovascular;  Laterality: N/A;   ACHILLES TENDON SURGERY  Bilateral    AMPUTATION Left 05/11/2020   Procedure: LEFT ABOVE KNEE AMPUTATION;  Surgeon: Angelia Mould, MD;  Location: [facility];  Service: Vascular;  Laterality: Left;   ENDARTERECTOMY POPLITEAL Left 05/01/2020   Procedure: ENDARTERECTOMY POPLITEAL;  Surgeon: Waynetta Sandy, MD;  Location: [facility];  Service: Vascular;  Laterality: Left;   FASCIOTOMY Left 05/01/2020   Procedure: FASCIOTOMY;  Surgeon: Waynetta Sandy, MD;  Location: [facility];  Service: Vascular;  Laterality: Left;   FEMORAL-POPLITEAL BYPASS GRAFT Left 02/26/2019   Procedure: BYPASS GRAFT FEMORAL-POPLITEAL ARTERY LEFT LEG USING 48m PROPATEN GRAFT;  Surgeon: BSerafina Mitchell MD;  [facility] [facility]  Service: Vascular;  Laterality: Left;   INTRAOPERATIVE ARTERIOGRAM Left 01/22/2020   Procedure: INTRA OPERATIVE ARTERIOGRAM with administration of thrombolyics in Left femoral to popliteal bypass.;  Surgeon: CMarty Heck MD;  [place] [facility]  Service: Vascular;  Laterality: Left;   LEFT HEART CATH AND CORONARY ANGIOGRAPHY N/A 03/15/2017   Procedure: LEFT HEART CATH AND CORONARY ANGIOGRAPHY;  Surgeon: ENelva Bush MD;  [facility];  Service: Cardiovascular;  Laterality: N/A;   LEFT HEART CATH AND CORONARY ANGIOGRAPHY N/A 02/06/2022   Procedure: LEFT HEART CATH AND CORONARY ANGIOGRAPHY;  Surgeon: JMartinique Peter M, MD;  [facility];  Service: Cardiovascular;  Laterality: N/A;   LOWER EXTREMITY INTERVENTION Left 04/29/2020   Procedure: LOWER EXTREMITY INTERVENTION- LYSIS;  Surgeon: CMarty Heck MD;  [facility];  Service: Cardiovascular;  Laterality: Left;   OSTEOTOMY AND ULNAR SHORTENING Right 07/22/2002   PATCH ANGIOPLASTY Left 05/01/2020   Procedure: PATCH ANGIOPLASTY USING HEMASHIELD PLATINUM FINESSE PATCH;  Surgeon: CWaynetta Sandy MD;  [place] [facility]  Service: Vascular;  Laterality: Left;   PERIPHERAL VASCULAR ATHERECTOMY  01/23/2020    Procedure: PERIPHERAL VASCULAR ATHERECTOMY;  Surgeon: CMarty Heck MD;  [facility];  Service: Cardiovascular;;   PERIPHERAL VASCULAR BALLOON ANGIOPLASTY  01/23/2020   Procedure: PERIPHERAL VASCULAR BALLOON ANGIOPLASTY;  Surgeon: CMarty Heck MD;  [facility];  Service: Cardiovascular;;   PERIPHERAL VASCULAR INTERVENTION Left 04/30/2020   Procedure: PERIPHERAL VASCULAR INTERVENTION;  Surgeon: HCherre Robins MD;  [facility];  Service: Cardiovascular;  Laterality: Left;   PERIPHERAL VASCULAR THROMBECTOMY N/A 01/23/2020   Procedure: lysis recheck;  Surgeon: CMarty Heck MD;  [facility];  Service: Cardiovascular;  Laterality: N/A;  + [place]    PERIPHERAL VASCULAR THROMBECTOMY N/A 04/30/2020   Procedure: Lysis Recheck;  Surgeon: HCherre Robins MD;  [facility];  Service: Cardiovascular;  Laterality: N/A;   SHOULDER ARTHROSCOPY WITH BICEPSTENOTOMY Right 03/11/2015   Procedure: SHOULDER ARTHROSCOPY WITH BICEPSTENOTOMY;  Surgeon: DNinetta Lights MD;  [place] [facility]  Service: Orthopedics;  Laterality: Right;   SHOULDER ARTHROSCOPY WITH DISTAL CLAVICLE RESECTION Left 11/19/2014   Procedure: SHOULDER ARTHROSCOPY WITH DISTAL CLAVICLE RESECTION;  Surgeon: DKathryne Hitch MD;  [place] [facility]  Service: Orthopedics;  Laterality: Left;   SHOULDER ARTHROSCOPY WITH ROTATOR CUFF REPAIR AND SUBACROMIAL DECOMPRESSION Left  11/19/2014   Procedure: LEFT SHOULDER ARTHROSCOPY, DEBRIDEMENT DISTAL CLAVICLE EXCISION, ACROMIOPLASTY WITH ROTATOR CUFF REPAIR ;  Surgeon: Kathryne Hitch, MD;  Location: [facility];  Service: Orthopedics;  Laterality: Left;   THROMBECTOMY OF BYPASS GRAFT FEMORAL- POPLITEAL ARTERY Left 05/01/2020   Procedure: THROMBECTOMY OF LOWER EXTREMITY;  Surgeon: Waynetta Sandy, MD;  Location: [facility];  Service: Vascular;  Laterality:  Left;   TRANSFORAMINAL LUMBAR INTERBODY FUSION (TLIF) WITH PEDICLE SCREW FIXATION 1 LEVEL N/A 01/03/2018   Procedure: TRANSFORAMINAL LUMBAR INTERBODY FUSION (TLIF) LUMBAR FIVE-SACRAL ONE;  Surgeon: Melina Schools, MD;  Location: [facility];  Service: Orthopedics;  Laterality: N/A;   ULTRASOUND GUIDANCE FOR VASCULAR ACCESS Right 01/22/2020   Procedure: ULTRASOUND GUIDANCE FOR VASCULAR ACCESS Right Common Femoral Artery.;  Surgeon: Marty Heck, MD;  Location: [facility];  Service: Vascular;  Laterality: Right;   VIDEO ASSISTED THORACOSCOPY (VATS)/DECORTICATION Right 11/21/2011   drainage of empyema    Prior to Admission medications   Medication Sig Start Date End Date Taking? Authorizing Provider  amLODipine (NORVASC) 10 MG tablet TAKE 1 TABLET BY MOUTH EVERY DAY 05/23/21   Jose Persia, MD  APIXABAN Arne [place]) VTE STARTER PACK (10MG AND 5MG) Take as directed on package: start with two-60m tablets twice daily for 7 days. On day 8, switch to one-534mtablet twice daily. 01/10/22   CoTonye PearsonPA-C  atorvastatin (LIPITOR) 80 MG tablet Take 1 tablet (80 mg total) by mouth daily at 6 PM. 11/06/19   RaOda KiltsMD  chlorthalidone (HYGROTON) 25 MG tablet TAKE 1 TABLET BY MOUTH EVERY DAY 09/27/21   Demaio, Alexa, MD  clopidogrel (PLAVIX) 75 MG tablet Take 1 tablet (75 mg total) by mouth daily. 07/18/21 07/18/22  GaDelene RuffiniMD  diclofenac Sodium (VOLTAREN) 1 % GEL Apply 2-4 g topically in the morning, at noon, in the evening, and at bedtime. 12/26/21   [provider]  docusate sodium (COLACE) 100 MG capsule Take 1 capsule (100 mg total) by mouth daily. 01/27/21   StMadalyn RobMD  DULoxetine (CYMBALTA) 30 MG capsule Take 30 mg by mouth daily. 12/26/21   [provider]  furosemide (LASIX) 40 MG tablet Take 1 tablet (40 mg total) by mouth every other day for 3 doses. 02/08/22 02/13/22  XuFlorencia ReasonsMD  gabapentin (NEURONTIN) 100 MG capsule Take 100 mg by mouth at bedtime as needed (for  pain).  12/31/19   [provider]  gabapentin (NEURONTIN) 300 MG capsule Take 300 mg by mouth 2 (two) times daily. 08/02/21   [provider]  lidocaine (LIDODERM) 5 % Place 1 patch onto the skin daily. Remove & Discard patch within 12 hours or as directed by MD For chest wall pain 02/09/22   XuFlorencia ReasonsMD  metoprolol tartrate (LOPRESSOR) 25 MG tablet Take 0.5 tablets (12.5 mg total) by mouth 2 (two) times daily. 02/07/22   FeCharlynne CousinsMD  naloxone (NS[facility] LP2 MG/2ML injection Inject 2 mg into the muscle once as needed (opiod overdose (call 911, inject intramuscularly in shoulder or thigh. Repeat every 3 minutes)).     [provider]  naloxone (NP[facility] Hospital[facility]al spray 4 mg/0.1 mL Place 1 spray into the nose as needed. 12/26/21   [provider]  oxyCODONE-acetaminophen (PERCOCET) 10-325 MG tablet Take 1 tablet by mouth every 6 (six) hours as needed for severe pain. 01/23/22   [provider]  varenicline (CHANTIX) 1 MG tablet TAKE 1 TABLET BY MOUTH TWICE A DAY 04/05/21  Virl Axe, MD    Current Facility-Administered Medications  Medication Dose Route Frequency Provider Last Rate Last Admin   acetaminophen (TYLENOL) tablet 650 mg  650 mg Oral Q6H PRN Gaylan Gerold, DO       Or   acetaminophen (TYLENOL) suppository 650 mg  650 mg Rectal Q6H PRN Gaylan Gerold, DO       atorvastatin (LIPITOR) tablet 80 mg  80 mg Oral q1800 Gaylan Gerold, DO       azithromycin (ZITHROMAX) 500 mg in sodium chloride 0.9 % 250 mL IVPB  500 mg Intravenous Q24H Gaylan Gerold, DO   Stopped at 02/11/22 0409   [START ON 02/12/2022] cefTRIAXone (ROCEPHIN) 2 g in sodium chloride 0.9 % 100 mL IVPB  2 g Intravenous Once Gaylan Gerold, DO       DULoxetine (CYMBALTA) DR capsule 30 mg  30 mg Oral Daily Gaylan Gerold, DO       furosemide (LASIX) injection 20 mg  20 mg Intravenous Once Gaylan Gerold, DO       gabapentin (NEURONTIN) capsule 300 mg  300 mg Oral BID Gaylan Gerold, DO        lidocaine (LIDODERM) 5 % 1 patch  1 patch Transdermal Q24H Gaylan Gerold, DO       lidocaine (PF) (XYLOCAINE) 1 % injection            metoprolol tartrate (LOPRESSOR) tablet 12.5 mg  12.5 mg Oral BID Gaylan Gerold, DO       oxyCODONE-acetaminophen (PERCOCET/ROXICET) 5-325 MG per tablet 1 tablet  1 tablet Oral Q6H PRN Lucious Groves, DO       And   oxyCODONE (Oxy IR/ROXICODONE) immediate release tablet 5 mg  5 mg Oral Q6H PRN Lucious Groves, DO       polyethylene glycol (MIRALAX / GLYCOLAX) packet 17 g  17 g Oral Daily PRN Gaylan Gerold, DO       potassium chloride SA (KLOR-CON M) CR tablet 40 mEq  40 mEq Oral BID Gaylan Gerold, DO       varenicline (CHANTIX) tablet 1 mg  1 mg Oral BID Gaylan Gerold, DO       Current Outpatient Medications  Medication Sig Dispense Refill   amLODipine (NORVASC) 10 MG tablet TAKE 1 TABLET BY MOUTH EVERY DAY 90 tablet 1   APIXABAN (ELIQUIS) VTE STARTER PACK (10MG AND 5MG) Take as directed on package: start with two-85m tablets twice daily for 7 days. On day 8, switch to one-518mtablet twice daily. 1 each 0   atorvastatin (LIPITOR) 80 MG tablet Take 1 tablet (80 mg total) by mouth daily at 6 PM. 30 tablet 11   chlorthalidone (HYGROTON) 25 MG tablet TAKE 1 TABLET BY MOUTH EVERY DAY 90 tablet 1   clopidogrel (PLAVIX) 75 MG tablet Take 1 tablet (75 mg total) by mouth daily. 30 tablet 11   diclofenac Sodium (VOLTAREN) 1 % GEL Apply 2-4 g topically in the morning, at noon, in the evening, and at bedtime.     docusate sodium (COLACE) 100 MG capsule Take 1 capsule (100 mg total) by mouth daily. 10 capsule 0   DULoxetine (CYMBALTA) 30 MG capsule Take 30 mg by mouth daily.     furosemide (LASIX) 40 MG tablet Take 1 tablet (40 mg total) by mouth every other day for 3 doses. 3 tablet 0   gabapentin (NEURONTIN) 100 MG capsule Take 100 mg by mouth at bedtime as needed (for pain).      gabapentin (NEURONTIN)  300 MG capsule Take 300 mg by mouth 2 (two) times daily.     lidocaine  (LIDODERM) 5 % Place 1 patch onto the skin daily. Remove & Discard patch within 12 hours or as directed by MD For chest wall pain 30 patch 0   metoprolol tartrate (LOPRESSOR) 25 MG tablet Take 0.5 tablets (12.5 mg total) by mouth 2 (two) times daily. 60 tablet 0   naloxone (NARCAN) 2 MG/2ML injection Inject 2 mg into the muscle once as needed (opiod overdose (call 911, inject intramuscularly in shoulder or thigh. Repeat every 3 minutes)).      naloxone (NARCAN) nasal spray 4 mg/0.1 mL Place 1 spray into the nose as needed.     oxyCODONE-acetaminophen (PERCOCET) 10-325 MG tablet Take 1 tablet by mouth every 6 (six) hours as needed for severe pain.     varenicline (CHANTIX) 1 MG tablet TAKE 1 TABLET BY MOUTH TWICE A DAY 60 tablet 1    Allergies as of 02/11/2022 - Review Complete 02/11/2022  Allergen Reaction Noted   Dilaudid [hydromorphone hcl] Rash 05/08/2020   Ramipril Rash and Other (See Comments) 06/23/2011   Tramadol Itching and Rash 06/23/2011    Family History  Problem Relation Age of Onset   Heart attack Father 58   Alcohol abuse Father    Aneurysm Mother    Stroke Neg Hx    Cancer Neg Hx     Social History   Socioeconomic History   Marital status: Single    Spouse name: Not on file   Number of children: 0   Years of education: Not on file   Highest education level: Not on file  Occupational History   Occupation: Disabled    Comment: 2/2 RA  Tobacco Use   Smoking status: Former    Packs/day: 0.25    Years: 20.00    Total pack years: 5.00    Types: Cigarettes    Quit date: 02/19/2019    Years since quitting: 2.9   Smokeless tobacco: Never   Tobacco comments:    Patient stated he smokes a couple day and is attempting to quit smoking  Vaping Use   Vaping Use: Never used  Substance and Sexual Activity   Alcohol use: No    Alcohol/week: 0.0 standard drinks of alcohol    Comment: sober 1998   Drug use: No   Sexual activity: Not on file  Other Topics Concern    Not on file  Social History Narrative   On disability since 1992, used to work with city of [place]. Quit drinking in 1990.      Current Social History 11/05/2019        Patient lives by himself most of the time. Sometimes girlfriend's son stays with him in a one level home. There are 3 steps with handrail up to the entrance the patient uses.       Patient's method of transportation is personal truck. This was recently vandalized and patient doesn't have funds to repair at this time.      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      Identified important Relationships are "My girlfriend, Maudie Mercury."       Pets : American Terrier Market researcher), Lab (Roxy)       Interests / Fun: Walk, watch TV       Current Stressors: "Going through a lot the last 6-7 years; I worry too much  about my health." (Discussed I[facility], patient not interested at this time.)      Religious / Personal Beliefs: Baptist       L. [place]atte, BSN, RN-BC    Social Determinants of Health   Financial Resource Strain: Not on file  Food Insecurity: No Food Insecurity (01/27/2022)   Hunger Vital Sign    Worried About Running Out of Food in the Last Year: Never true    [place] in the Last Year: Never true  Transportation Needs: No Transportation Needs (01/27/2022)   PRAPARE - Hydrologist (Medical): No    Lack of Transportation (Non-Medical): No  Physical Activity: Not on file  Stress: Not on file  Social Connections: Not on file  Intimate Partner Violence: Not on file    Review of Systems: Pertinent positive and negative review of systems were noted in the above HPI section.  All other review of systems was otherwise negative.   Physical Exam: Vital signs in last 24 hours: Temp:  [97.4 F (36.3 C)-97.6 F (36.4 C)] 97.4 F (36.3 C) (08/19 0528) Pulse Rate:  [29-100] 93 (08/19 0915) Resp:  [17-32] 31 (08/19 0915) BP: (97-127)/(69-86)  121/81 (08/19 0915) SpO2:  [92 %-98 %] 97 % (08/19 0915) Weight:  [79.4 kg] 79.4 kg (08/19 0138)   General:   Alert,  Well-developed, chronically ill-appearing older white male , friend at bedside -pleasant and cooperative in NAD Head:  Normocephalic and atraumatic. Eyes:  Sclera clear, no icterus.   Conjunctiva pink. Ears:  Normal auditory acuity. Nose:  No deformity, discharge,  or lesions. Mouth:  No deformity or lesions.   Neck:  Supple; no masses or thyromegaly. Lungs: Creased breath sounds bilateral bases  heart:  Regular rate and rhythm; no murmurs, clicks, rubs,  or gallops. Abdomen: Protuberant, nontender BS active, no palpable mass or hepatosplenomegaly Rectal: Not done Msk:  Symmetrical without gross deformities. . Pulses:  Normal pulses noted. Extremities: Status post left AKA Neurologic:  Alert and  oriented x4;  grossly normal neurologically. Skin:  Intact without significant lesions or rashes.. Psych:  Alert and cooperative. Normal mood and affect.  Intake/Output from previous day: 08/18 0701 - 08/19 0700 In: 350 [IV Piggyback:350] Out: -  Intake/Output this shift: Total I/O In: 237 [P.O.:237] Out: -   Lab Results: Recent Labs    02/11/22 0151  WBC 12.9*  HGB 10.1*  HCT 32.2*  PLT 352   BMET Recent Labs    02/11/22 0151  NA 134*  K 3.3*  CL 100  CO2 23  GLUCOSE 92  BUN 18  CREATININE 1.15  CALCIUM 7.8*   LFT Recent Labs    02/11/22 0151  PROT 6.1*  ALBUMIN 2.0*  AST 29  ALT 43  ALKPHOS 195*  BILITOT 0.5   PT/INR No results for input(s): "LABPROT", "INR" in the last 72 hours. Hepatitis Panel No results for input(s): "HEPBSAG", "HCVAB", "HEPAIGM", "HEPBIGM" in the last 72 hours.    IMPRESSION:  #40 61 year old male presenting with progressive shortness of breath over the past couple of days.  Patient is currently at [facility] rehab after very recent cardiac arrest on 02/01/2022.  He received prolonged CPR, was diagnosed with an  inferior MI and acute PE.  Anticoagulated  Imaging of the chest showed moderate left and small right effusion, Been hemodynamically stable, was hypoxic prior to arrival, stable on 3 L Now stable status post thoracentesis with removal of 300 cc the left.  Persistently elevated troponins though flat  #2 CTA and CT of the abdomen and pelvis showing a large liver lesion left lobe consistent with malignancy, rule out metastatic versus primary.  Also has ill-defined pancreatic head with mild diffuse peripancreatic edema, necrotic lymph node at the gastrohepatic ligament and probable large necrotic node measuring 4.5 x 3.4 cm adjacent to the EG junction there is also periaortic adenopathy.  Rule out primary pancreatic adenocarcinoma, metastatic versus primary hepatocellular carcinoma metastatic  Certainly underlying malignancy would likely responsible for very recent PE, and perhaps MI secondary to hypercoagulable state  #3 peripheral vascular disease status post left AKA #4 rheumatoid arthritis #5 iron deficiency anemia #6 multiple bilateral rib fractures secondary to CPR   PLAN:  Will proceed with MRI of the abdomen/ pelvis Have ordered CEA/ CA 19-9/ AFP, INR Await cytology from pleural fluid If pleural fluid cytology not diagnostic, will require biopsy likely of the liver lesion. He will need to be off of Eliquis for potential biopsy-if needs to be anticoagulated, can use heparin or Lovenox for to medicine team  GI will follow along with you, recommendations pending results of labs and MRI   Kyree Adriano  PA-C 02/11/2022, 10:23 AM

## 2022-02-11 NOTE — ED Provider Notes (Signed)
[facility] Emergency Department Provider Note MRN:  [id number]  Arrival date & time: 02/11/22     Chief Complaint   Shortness of Breath  History of Present Illness   [name] [name] is a 61 y.o. year-old male with a history of Rheumatoid arthritis, peripheral vascular disease, CAD presenting to the ED with chief complaint of shortness of breath.  Patient arriving via EMS from care facility.  He is in rehab after experiencing cardiac arrest after heart attack a few weeks ago.  Was noted to be hypoxic this evening at 81%.  Feels mildly short of breath but improved on oxygen.  Having continued soreness to the chest and ribs, explains that he received CPR and his ribs are broken.  Review of Systems  A thorough review of systems was obtained and all systems are negative except as noted in the HPI and PMH.   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   Hypertension    states is borderline on medication; has been on med. x 5-6 yr.   Hypokalemia 05/30/2018   Impingement syndrome of shoulder region 10/2014   left   Insomnia 06/01/2015   Ischemic ulcer of toe of left foot (HCC)    Left shoulder pain 12/16/2015   Low back pain without sciatica 10/29/2009   Lupus ([place])    Peripheral vascular disease (HCC)    Pneumonia    RA (rheumatoid arthritis) ([place])    Rotator cuff tear 11/04/2014    Past Surgical History:  Procedure Laterality Date   ABDOMINAL AORTAGRAM  02/20/2019   ABDOMINAL AORTOGRAM W/LOWER EXTREMITY N/A 02/20/2019   Procedure: ABDOMINAL AORTOGRAM W/LOWER EXTREMITY;  Surgeon: Marty Heck, MD;  Location: [place] [facility];  Service: Cardiovascular;  Laterality:  N/A;   ACHILLES TENDON SURGERY Bilateral    AMPUTATION Left 05/11/2020   Procedure: LEFT ABOVE KNEE AMPUTATION;  Surgeon: Angelia Mould, MD;  Location: [place];  Service: Vascular;  Laterality: Left;   ENDARTERECTOMY POPLITEAL Left 05/01/2020   Procedure: ENDARTERECTOMY POPLITEAL;  Surgeon: Waynetta Sandy, MD;  Location: [place];  Service: Vascular;  Laterality: Left;   FASCIOTOMY Left 05/01/2020   Procedure: FASCIOTOMY;  Surgeon: Waynetta Sandy, MD;  Location: [place];  Service: Vascular;  Laterality: Left;   FEMORAL-POPLITEAL BYPASS GRAFT Left 02/26/2019   Procedure: BYPASS GRAFT FEMORAL-POPLITEAL ARTERY LEFT LEG USING 50m PROPATEN GRAFT;  Surgeon: BSerafina Mitchell MD;  [place]  Service: Vascular;  Laterality: Left;   INTRAOPERATIVE ARTERIOGRAM Left 01/22/2020   Procedure: INTRA OPERATIVE ARTERIOGRAM with administration of thrombolyics in Left femoral to popliteal bypass.;  Surgeon: CMarty Heck MD;  [place]  Service: Vascular;  Laterality: Left;   LEFT HEART CATH AND CORONARY ANGIOGRAPHY N/A 03/15/2017   Procedure: LEFT HEART CATH AND CORONARY ANGIOGRAPHY;  Surgeon: ENelva Bush MD;  [place] [facility];  Service: Cardiovascular;  Laterality: N/A;   LEFT HEART CATH AND CORONARY ANGIOGRAPHY N/A 02/06/2022   Procedure: LEFT HEART CATH AND CORONARY ANGIOGRAPHY;  Surgeon: JMartinique Peter M, MD;  [place] [facility];  Service: Cardiovascular;  Laterality: N/A;  LOWER EXTREMITY INTERVENTION Left 04/29/2020   Procedure: LOWER EXTREMITY INTERVENTION- LYSIS;  Surgeon: Marty Heck, MD;  Location: [place] [facility];  Service: Cardiovascular;  Laterality: Left;   OSTEOTOMY AND ULNAR SHORTENING Right 07/22/2002   PATCH ANGIOPLASTY Left 05/01/2020   Procedure: PATCH ANGIOPLASTY USING HEMASHIELD PLATINUM FINESSE PATCH;  Surgeon: Waynetta Sandy, MD;  Location: [place];  Service: Vascular;  Laterality: Left;   PERIPHERAL  VASCULAR ATHERECTOMY  01/23/2020   Procedure: PERIPHERAL VASCULAR ATHERECTOMY;  Surgeon: Marty Heck, MD;  Location: [place] [facility];  Service: Cardiovascular;;   PERIPHERAL VASCULAR BALLOON ANGIOPLASTY  01/23/2020   Procedure: PERIPHERAL VASCULAR BALLOON ANGIOPLASTY;  Surgeon: Marty Heck, MD;  Location: [place] [facility];  Service: Cardiovascular;;   PERIPHERAL VASCULAR INTERVENTION Left 04/30/2020   Procedure: PERIPHERAL VASCULAR INTERVENTION;  Surgeon: Cherre Robins, MD;  Location: [place] [facility];  Service: Cardiovascular;  Laterality: Left;   PERIPHERAL VASCULAR THROMBECTOMY N/A 01/23/2020   Procedure: lysis recheck;  Surgeon: Marty Heck, MD;  Location: [place] [facility];  Service: Cardiovascular;  Laterality: N/A;  + [place]    PERIPHERAL VASCULAR THROMBECTOMY N/A 04/30/2020   Procedure: Lysis Recheck;  Surgeon: Cherre Robins, MD;  Location: [place] [facility];  Service: Cardiovascular;  Laterality: N/A;   SHOULDER ARTHROSCOPY WITH BICEPSTENOTOMY Right 03/11/2015   Procedure: SHOULDER ARTHROSCOPY WITH BICEPSTENOTOMY;  Surgeon: Ninetta Lights, MD;  Location: [place];  Service: Orthopedics;  Laterality: Right;   SHOULDER ARTHROSCOPY WITH DISTAL CLAVICLE RESECTION Left 11/19/2014   Procedure: SHOULDER ARTHROSCOPY WITH DISTAL CLAVICLE RESECTION;  Surgeon: Kathryne Hitch, MD;  Location: [place];  Service: Orthopedics;  Laterality: Left;   SHOULDER ARTHROSCOPY WITH ROTATOR CUFF REPAIR AND SUBACROMIAL DECOMPRESSION Left 11/19/2014   Procedure: LEFT SHOULDER ARTHROSCOPY, DEBRIDEMENT DISTAL CLAVICLE EXCISION, ACROMIOPLASTY WITH ROTATOR CUFF REPAIR ;  Surgeon: Kathryne Hitch, MD;  Location: [place];  Service: Orthopedics;  Laterality: Left;   THROMBECTOMY OF BYPASS GRAFT FEMORAL- POPLITEAL ARTERY Left 05/01/2020   Procedure: THROMBECTOMY OF LOWER EXTREMITY;  Surgeon: Waynetta Sandy, MD;  Location: [place];   Service: Vascular;  Laterality: Left;   TRANSFORAMINAL LUMBAR INTERBODY FUSION (TLIF) WITH PEDICLE SCREW FIXATION 1 LEVEL N/A 01/03/2018   Procedure: TRANSFORAMINAL LUMBAR INTERBODY FUSION (TLIF) LUMBAR FIVE-SACRAL ONE;  Surgeon: Melina Schools, MD;  Location: [place];  Service: Orthopedics;  Laterality: N/A;   ULTRASOUND GUIDANCE FOR VASCULAR ACCESS Right 01/22/2020   Procedure: ULTRASOUND GUIDANCE FOR VASCULAR ACCESS Right Common Femoral Artery.;  Surgeon: Marty Heck, MD;  Location: [facility];  Service: Vascular;  Laterality: Right;   VIDEO ASSISTED THORACOSCOPY (VATS)/DECORTICATION Right 11/21/2011   drainage of empyema    Family History  Problem Relation Age of Onset   Heart attack Father 62   Alcohol abuse Father    Aneurysm Mother    Stroke Neg Hx    Cancer Neg Hx     Social History   Socioeconomic History   Marital status: Single    Spouse name: Not on file   Number of children: 0   Years of education: Not on file   Highest education level: Not on file  Occupational History   Occupation: Disabled    Comment: 2/2 RA  Tobacco Use   Smoking status: Former    Packs/day: 0.25    Years: 20.00    Total pack years: 5.00    Types: Cigarettes    Quit date: 02/19/2019  Years since quitting: 2.9   Smokeless tobacco: Never   Tobacco comments:    Patient stated he smokes a couple day and is attempting to quit smoking  Vaping Use   Vaping Use: Never used  Substance and Sexual Activity   Alcohol use: No    Alcohol/week: 0.0 standard drinks of alcohol    Comment: sober 1998   Drug use: No   Sexual activity: Not on file  Other Topics Concern   Not on file  Social History Narrative   On disability since 1992, used to work with city of [place]. Quit drinking in 1990.      Current Social History 11/05/2019        Patient lives by himself most of the time. Sometimes girlfriend's son stays with him in a one level home. There are 3 steps with handrail up to the entrance the  patient uses.       Patient's method of transportation is personal truck. This was recently vandalized and patient doesn't have funds to repair at this time.      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, BSN, RN-BC    Social Determinants of Health   Financial Resource Strain: Not on file  Food Insecurity: No Food Insecurity (01/27/2022)   Hunger Vital Sign    Worried About Running Out of Food in the Last Year: Never true    [place] in the Last Year: Never true  Transportation Needs: No Transportation Needs (01/27/2022)   PRAPARE - Hydrologist (Medical): No    Lack of Transportation (Non-Medical): No  Physical Activity: Not on file  Stress: Not on file  Social Connections: Not on file  Intimate Partner Violence: Not on file     Physical Exam   Vitals:   02/11/22 0530 02/11/22 0545  BP: 107/79 117/77  Pulse: 88 90  Resp: 17 18  Temp:    SpO2: 95% 95%    CONSTITUTIONAL: Chronically ill-appearing, NAD NEURO/PSYCH:  Alert and oriented x 3, no focal deficits EYES:  eyes equal and reactive ENT/NECK:  no LAD, no JVD CARDIO: Regular rate, well-perfused, normal S1 and S2 PULM:  CTAB no wheezing or rhonchi, mild tachypnea GI/GU:  non-distended, non-tender MSK/SPINE:  No gross deformities, no edema SKIN:  no rash, atraumatic   *Additional and/or pertinent findings included in MDM below  Diagnostic and Interventional Summary    EKG Interpretation  Date/Time:  Saturday February 11 2022 01:34:15 EDT Ventricular Rate:   100 PR Interval:  117 QRS Duration: 93 QT Interval:  358 QTC Calculation: 462 R Axis:   95 Text Interpretation: Sinus tachycardia Low voltage with right axis deviation Anteroseptal infarct, old Nonspecific T abnormalities, lateral leads Confirmed by Gerlene Fee [phone]) on 02/11/2022 1:57:55 AM       Labs Reviewed  CBC - Abnormal; Notable for the following components:  Result Value   WBC 12.9 (*)    RBC 3.91 (*)    Hemoglobin 10.1 (*)    HCT 32.2 (*)    MCH 25.8 (*)    RDW 18.3 (*)    All other components within normal limits  COMPREHENSIVE METABOLIC PANEL - Abnormal; Notable for the following components:   Sodium 134 (*)    Potassium 3.3 (*)    Calcium 7.8 (*)    Total Protein 6.1 (*)    Albumin 2.0 (*)    Alkaline Phosphatase 195 (*)    All other components within normal limits  BRAIN NATRIURETIC PEPTIDE - Abnormal; Notable for the following components:   B Natriuretic Peptide 330.0 (*)    All other components within normal limits  TROPONIN I (HIGH SENSITIVITY) - Abnormal; Notable for the following components:   Troponin I (High Sensitivity) 1,129 (*)    All other components within normal limits  TROPONIN I (HIGH SENSITIVITY) - Abnormal; Notable for the following components:   Troponin I (High Sensitivity) 1,160 (*)    All other components within normal limits  SARS CORONAVIRUS 2 BY RT PCR    DG Chest Port 1 View  Final Result    CT Angio Chest Pulmonary Embolism (PE) W or WO Contrast    (Results Pending)  CT ABDOMEN PELVIS W CONTRAST    (Results Pending)    Medications  azithromycin (ZITHROMAX) 500 mg in sodium chloride 0.9 % 250 mL IVPB (0 mg Intravenous Stopped 02/11/22 0409)  fentaNYL (SUBLIMAZE) injection 50 mcg (50 mcg Intravenous Given 02/11/22 0221)  cefTRIAXone (ROCEPHIN) 2 g in sodium chloride 0.9 % 100 mL IVPB (0 g Intravenous Stopped 02/11/22 0349)  iohexol (OMNIPAQUE) 350 MG/ML injection 100 mL (100 mLs Intravenous Contrast Given 02/11/22 0507)      Procedures  /  Critical Care Procedures  ED Course and Medical Decision Making  Initial Impression and Ddx Differential diagnosis includes pneumonia, pneumothorax, ACS, PE, pericardial effusion, awaiting labs, CT imaging.  Past medical/surgical history that increases complexity of ED encounter: Recent cardiac arrest, STEMI, PE  Interpretation of Diagnostics I personally reviewed the EKG and my interpretation is as follows: Sinus tachycardia  Work-up reveals no significant blood count or electrolyte disturbance, troponin of greater than 1000 but downtrending from prior, mildly elevated BNP.  PE study showing a stable embolism.  Patient Reassessment and Ultimate Disposition/Management     Admitted to medicine.  Patient management required discussion with the following services or consulting groups:  Hospitalist Service  Complexity of Problems Addressed Acute illness or injury that poses threat of life of bodily function  Additional Data Reviewed and Analyzed Further history obtained from: EMS on arrival  Additional Factors Impacting ED Encounter Risk Consideration of hospitalization  Barth Kirks. Sedonia Small, MD [place] [email or web address]  Final Clinical Impressions(s) / ED Diagnoses     ICD-10-CM   1. Hypoxia  R09.02     2. Chest pain, unspecified type  R07.9       ED Discharge Orders     None        Discharge Instructions Discussed with and Provided to Patient:   Discharge Instructions   None      Maudie Flakes, MD 02/11/22 [phone]

## 2022-02-12 DIAGNOSIS — J189 Pneumonia, unspecified organism: Secondary | ICD-10-CM

## 2022-02-12 DIAGNOSIS — R160 Hepatomegaly, not elsewhere classified: Secondary | ICD-10-CM | POA: Diagnosis not present

## 2022-02-12 DIAGNOSIS — J9601 Acute respiratory failure with hypoxia: Secondary | ICD-10-CM

## 2022-02-12 DIAGNOSIS — Z87891 Personal history of nicotine dependence: Secondary | ICD-10-CM

## 2022-02-12 DIAGNOSIS — D509 Iron deficiency anemia, unspecified: Secondary | ICD-10-CM

## 2022-02-12 DIAGNOSIS — I5032 Chronic diastolic (congestive) heart failure: Secondary | ICD-10-CM

## 2022-02-12 DIAGNOSIS — J90 Pleural effusion, not elsewhere classified: Secondary | ICD-10-CM | POA: Diagnosis not present

## 2022-02-12 DIAGNOSIS — R933 Abnormal findings on diagnostic imaging of other parts of digestive tract: Secondary | ICD-10-CM

## 2022-02-12 DIAGNOSIS — I252 Old myocardial infarction: Secondary | ICD-10-CM

## 2022-02-12 DIAGNOSIS — Z86718 Personal history of other venous thrombosis and embolism: Secondary | ICD-10-CM

## 2022-02-12 DIAGNOSIS — I2510 Atherosclerotic heart disease of native coronary artery without angina pectoris: Secondary | ICD-10-CM

## 2022-02-12 LAB — BASIC METABOLIC PANEL
Anion gap: 9 (ref 5–15)
BUN: 19 mg/dL (ref 8–23)
CO2: 24 mmol/L (ref 22–32)
Calcium: 7.9 mg/dL — ABNORMAL LOW (ref 8.9–10.3)
Chloride: 102 mmol/L (ref 98–111)
Creatinine, Ser: 1.08 mg/dL (ref 0.61–1.24)
GFR, Estimated: >60 mL/min (ref >60)
Glucose, Bld: 154 mg/dL — ABNORMAL HIGH (ref 70–99)
Potassium: 3.8 mmol/L (ref 3.5–5.1)
Sodium: 135 mmol/L (ref 135–145)

## 2022-02-12 LAB — CBC
HCT: 28.4 % — ABNORMAL LOW (ref 39.0–52.0)
Hemoglobin: 9.1 g/dL — ABNORMAL LOW (ref 13.0–17.0)
MCH: 25.9 pg — ABNORMAL LOW (ref 26.0–34.0)
MCHC: 32 g/dL (ref 30.0–36.0)
MCV: 80.9 fL (ref 80.0–100.0)
Platelets: 332 10*3/uL (ref 150–400)
RBC: 3.51 MIL/uL — ABNORMAL LOW (ref 4.22–5.81)
RDW: 17.8 % — ABNORMAL HIGH (ref 11.5–15.5)
WBC: 11.7 10*3/uL — ABNORMAL HIGH (ref 4.0–10.5)
nRBC: 0 % (ref 0.0–0.2)

## 2022-02-12 LAB — GLUCOSE, CAPILLARY
Glucose-Capillary: 153 mg/dL — ABNORMAL HIGH (ref 70–99)
Glucose-Capillary: 109 mg/dL — ABNORMAL HIGH (ref 70–99)

## 2022-02-12 MED ORDER — FUROSEMIDE 10 MG/ML IJ SOLN
40.0000 mg | Freq: Once | INTRAMUSCULAR | Status: AC
  Administered 2022-02-12: 40 mg via INTRAVENOUS
  Filled 2022-02-12: qty 4

## 2022-02-12 MED ORDER — AZITHROMYCIN 250 MG PO TABS
500.0000 mg | ORAL_TABLET | Freq: Every day | ORAL | Status: DC
  Administered 2022-02-13 – 2022-02-14 (×2): 500 mg via ORAL
  Filled 2022-02-12 (×2): qty 2

## 2022-02-12 MED ORDER — SODIUM CHLORIDE 0.9 % IV SOLN
2.0000 g | INTRAVENOUS | Status: DC
  Administered 2022-02-13 – 2022-02-14 (×2): 2 g via INTRAVENOUS
  Filled 2022-02-12 (×2): qty 20

## 2022-02-12 MED ORDER — HEPARIN (PORCINE) 25000 UT/250ML-% IV SOLN
1300.0000 [IU]/h | INTRAVENOUS | Status: DC
  Administered 2022-02-12: 1300 [IU]/h via INTRAVENOUS
  Administered 2022-02-13 – 2022-02-14 (×2): 1100 [IU]/h via INTRAVENOUS
  Administered 2022-02-15: 1300 [IU]/h via INTRAVENOUS
  Filled 2022-02-12 (×3): qty 250

## 2022-02-12 NOTE — Progress Notes (Signed)
ANTICOAGULATION CONSULT NOTE - Initial Consult  Pharmacy Consult for Heparin  Indication: Acute PE - 8/8  Allergies  Allergen Reactions   Dilaudid [Hydromorphone Hcl] Rash   Ramipril Rash and Other (See Comments)    Per patient on R arm and leg only; no angioedema   Tramadol Itching and Rash    Patient Measurements: Height: '5\' 7"'$  (170.2 cm) Weight: 83 kg (182 lb 15.7 oz) IBW/kg (Calculated) : 66.1 Heparin Dosing Weight: 83.4 kg  Vital Signs: Temp: 97.9 F (36.6 C) (08/20 0752) Temp Source: Oral (08/20 0752) BP: 116/78 (08/20 0752) Pulse Rate: 94 (08/20 0800)  Labs: Recent Labs    02/11/22 0151 02/11/22 0405 02/11/22 0915 02/11/22 1238 02/11/22 1510 02/12/22 0428  HGB 10.1*  --   --   --   --  9.1*  HCT 32.2*  --   --   --   --  28.4*  PLT 352  --   --   --   --  332  LABPROT  --   --   --   --  18.2*  --   INR  --   --   --   --  1.5*  --   CREATININE 1.15  --   --   --   --  1.08  TROPONINIHS 1,129* 1,160* 933* 957*  --   --     Estimated Creatinine Clearance: 74.1 mL/min (by C-G formula based on SCr of 1.08 mg/dL).   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   Hypertension    states is borderline on medication; has been on med. x 5-6 yr.   Hypokalemia 05/30/2018   Impingement syndrome of shoulder region 10/2014   left   Insomnia 06/01/2015   Ischemic ulcer of toe of left foot (HCC)    Left shoulder pain 12/16/2015   Low back pain without sciatica 10/29/2009   Lupus ([place])    Peripheral vascular disease (HCC)    Pneumonia    RA (rheumatoid arthritis) ([place])    Rotator cuff tear 11/04/2014    Assessment: 61 yo male presented with SOB. Recent BLE DVT per venous duplex 01/11/20,  started 01/10/22 on Eliquis (PTA LD 8/18 '@2100'$ ) treatment dose and Plavix for PAD. Recent acute PE 01/31/22, CT from this admission shows prior PE unchanged. Patient received therapeutic lovenox 8/19-8/20. Pharmacy consulted for IV heparin dosing until biopsy complete.   Will monitor aPTT until correlates with Heparin Level. Will start heparin infusion 4 hours before due time of next lovenox dose. Hgb 9.1, PLT 332.   Goal of Therapy:  Heparin level 0.3-0.7 units/ml aPTT 66-102 seconds Monitor platelets by anticoagulation protocol: Yes   Plan:  Start heparin infusion at 1300 units/hr '@1700'$  (8/20) Check 8hr Heparin Level, aPTT  Continue to monitor daily CBC, HL, aPTT Follow up daily Heparin Level, aPTT, CBC   Francena Hanly, PharmD Pharmacy Resident  02/12/2022 11:53 AM

## 2022-02-12 NOTE — Progress Notes (Signed)
NAME:  [name] [name], MRN:  [id number], DOB:  [birth year], LOS: 1 ADMISSION DATE:  02/11/2022  Subjective  Patient evaluated at bedside this AM. Reports he is doing okay this morning, feels like his breathing has improved a bit since yesterday. Does mention he woke up and his sheets were soaked. No fevers, chills, abdominal pain, n/v overnight. Discussed radiographic results, plan moving forward.  Objective   Blood pressure 116/78, pulse 94, temperature 97.9 F (36.6 C), temperature source Oral, resp. rate (!) 21, height '5\' 7"'$  (1.702 m), weight 83 kg, SpO2 94 %.     Intake/Output Summary (Last 24 hours) at 02/12/2022 1112 Last data filed at 02/12/2022 0216 Gross per 24 hour  Intake 850 ml  Output 900 ml  Net -50 ml   Filed Weights   02/11/22 0138 02/11/22 1909 02/12/22 0701  Weight: 79.4 kg 85.1 kg 83 kg   Physical Exam: General: Chronically ill-appearing, unkept, resting in no acute distress CV: Regular rate, rhythm. No murmurs appreciated. Warm extremities. Pulm: Normal work of breathing on 3L supplemental oxygen. Rales appreciated bibasilar lung fields. No wheezing.  Abdomen: Soft, non-tender, non-distended. Normoactive bowel sounds. Neuro: Awake, alert, conversing appropriately. Grossly non-focal  Labs       Latest Ref Rng & Units 02/12/2022    4:28 AM 02/11/2022    1:51 AM 02/08/2022    6:16 AM  CBC  WBC 4.0 - 10.5 K/uL 11.7  12.9  13.6   Hemoglobin 13.0 - 17.0 g/dL 9.1  10.1  10.4   Hematocrit 39.0 - 52.0 % 28.4  32.2  33.0   Platelets 150 - 400 K/uL 332  352  337       Latest Ref Rng & Units 02/12/2022    4:28 AM 02/11/2022    1:51 AM 02/07/2022    3:22 AM  BMP  Glucose 70 - 99 mg/dL 154  92  80   BUN 8 - 23 mg/dL '19  18  27   '$ Creatinine 0.61 - 1.24 mg/dL 1.08  1.15  1.29   Sodium 135 - 145 mmol/L 135  134  138   Potassium 3.5 - 5.1 mmol/L 3.8  3.3  4.1   Chloride 98 - 111 mmol/L 102  100  107   CO2 22 - 32 mmol/L '24  23  22   '$ Calcium 8.9 - 10.3 mg/dL 7.9  7.8   8.1    Summary   [name] [name] is 61yo person with recent inferior STEMI and PE/DVT, peripheral arterial disease s/p L AKA 2021, rheumatoid arthritis not on DMARD admitted 8/19 with acute hypoxic respiratory failure and found to have incidental finding of multiple intra-abdominal masses.  Assessment & Plan:  Principal Problem:   Acute respiratory failure with hypoxemia (HCC) Active Problems:   Rheumatoid arthritis (HCC)   HTN (hypertension)   Coronary artery disease involving native coronary artery of native heart with unstable angina pectoris (HCC)   PAD (peripheral artery disease) (HCC)   DVT (deep venous thrombosis) (HCC)   Pulmonary emboli (HCC)   Acute respiratory failure with hypoxia (HCC)   Liver mass  #Acute hypoxic respiratory failure #Community-acquired pneumonia #Acute on chronic diastolic heart failure #Exudative L pleural effusion s/p thoracentesis This morning, patient continues to require 3L supplemental oxygen. Yesterday, patient was started on IV antibiotics for community-acquired pneumonia and given IV diuretics for acute heart failure. Therapeutic and diagnostic thoracentesis was also performed yesterday.Today patient continues to be symptomatic, although improved from initial presentation. Possible that these  three different processes are contributing to his symptoms. We will give another dose of IV lasix today as he still appears hypervolemic this morning. In addition, will continue with antibiotics.  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 Lab results consistent with exudative pleural effusion, likely as a result from underlying malignancy. This  underlying malignancy would also explain his recent DVT/PE at last hospitalization. MRI yesterday revealed rim enhancing necrotic masses around GE junction and in the anterior left lobe of the liver along with enlarged intra-abdominal lymph nodes. Per radiology, most consistent with primary esophageal malignancy. In addition, there is diffuse inflammatory fat stranding around pancreas along with lobulated, rim-enhancing fluid signal lesions around pancreatic head - per radiology more consistent with acute pancreatitis. Patient on exam is not having any abdominal pain, nausea, or vomiting. GI was consulted yesterday, we appreciate their recommendations. We will need to obtain biopsy, will discuss with GI best options for this. Will hold Eliquis further, will put him on a heparin gtt for now.  - Follow-up GI recommendations for biopsy - Follow-up tumor markers - Hold Eliquis until after biopsy is completed - Start heparin gtt  #History of recent inferior STEMI #Coronary artery disease Patient is not reporting any active chest pain, troponins down-trending on admission, ECG okay. Holding Plavix in setting of upcoming biopsy. Possibly patient has ischemic insult to pancreas w/ STEMI resulting in radiographic findings? Regardless patient currently asymptomatic. - Continue home atorvastatin, metoprolol - Holding home amlodipine, chlorthalidone in setting of normotension  #Iron deficiency anemia Hgb stable this morning, no signs or symptoms of bleeding. Iron studies yesterday revealed ferritin 151, Fe 13, sat 5%. Has ~'1000mg'$  deficit per [facility] equation, could benefit from IV iron. However given acute infection will hold off for now.  - Monitor CBC - Start iron repletion after infection clears  Best practice:  DIET: [place] IVF: n/a DVT PPX: heparin BOWEL: n/a CODE: FULL FAM COM: n/a  Sanjuan Dame, MD Internal Medicine Resident PGY-3 PAGER: [phone] 02/12/2022 11:12 AM  If after hours  (below), please contact on-call pager: [phone] 5PM-7AM Monday-Friday 1PM-7AM Saturday-Sunday

## 2022-02-12 NOTE — Progress Notes (Signed)
Patient ID: [name] [name], male   DOB: 03/28/1961, 61 y.o.   MRN: [id number]    Progress Note   Subjective   Day # 2  CC; shortness of breath Abnormal imaging of the abdomen  MRI abdomen yesterday-large rim-enhancing internally hypoenhancing liver mass centered in the left lobe of the liver, no gallstones or gallbladder wall thickening no ductal dilation, there is diffuse inflammatory fat stranding and fluid around the pancreas and adjacent retroperitoneum.  Multiple lobulated rim-enhancing fluid signal lesions throughout the pancreatic head and neck largest 3.6 x 2.8 cm, rim-enhancing internally hypoenhancing mass or lymph node conglomerate centered about the GE junction,, rim-enhancing internally hypoenhancing gastrohepatic ligament lymph node measuring 2.7 x 2.5 cm, enlarged retroperitoneal nodes.  Instillation of findings most consistent with primary esophageal malignancy and associated metastatic disease, and acute pancreatitis complicated by acute pancreatic fluid collections  CEA/CA 19-9/AFP- pending WBC 11.7/hemoglobin 9.1/hematocrit 28.4 Potassium 3.8/BUN 19/creatinine 1.08  Troponins 1160> 933> 957  Pleural fluid cytology pending  Patient was sleeping soundly, easily arousable.  He says his breathing is about the same, still having chest pain which he attributes to his ribs No complaints of abdominal pain Has been eating solid diet without difficulty, he denies any dysphagia or odynophagia, no heartburn or indigestion   Objective   Vital signs in last 24 hours: Temp:  [97.9 F (36.6 C)-98.7 F (37.1 C)] 97.9 F (36.6 C) (08/20 0752) Pulse Rate:  [88-107] 94 (08/20 0800) Resp:  [16-34] 21 (08/20 0800) BP: (91-126)/(52-88) 116/78 (08/20 0752) SpO2:  [91 %-99 %] 94 % (08/20 0800) Weight:  [83 kg-85.1 kg] 83 kg (08/20 0701) Last BM Date : 02/09/22 General:   Older white male in NAD, on O2 Heart:  Regular rate and rhythm; no murmurs Lungs: Respirations even , decreased  breath sounds bilateral bases Abdomen:  Soft, obese nontender , no palpable mass, normal bowel sounds. Extremities:  Without edema.  Status post left AKA Neurologic:  Alert and oriented,  grossly normal neurologically. Psych:  Cooperative. Normal mood and affect.  Intake/Output from previous day: 08/19 0701 - 08/20 0700 In: 1087 [P.O.:837; IV Piggyback:250] Out: 900 [Urine:900] Intake/Output this shift: No intake/output data recorded.  Lab Results: Recent Labs    02/11/22 0151 02/12/22 0428  WBC 12.9* 11.7*  HGB 10.1* 9.1*  HCT 32.2* 28.4*  PLT 352 332   BMET Recent Labs    02/11/22 0151 02/12/22 0428  NA 134* 135  K 3.3* 3.8  CL 100 102  CO2 23 24  GLUCOSE 92 154*  BUN 18 19  CREATININE 1.15 1.08  CALCIUM 7.8* 7.9*   LFT Recent Labs    02/11/22 0151  PROT 6.1*  ALBUMIN 2.0*  AST 29  ALT 43  ALKPHOS 195*  BILITOT 0.5   PT/INR Recent Labs    02/11/22 1510  LABPROT 18.2*  INR 1.5*    Studies/Results: US THORACENTESIS ASP PLEURAL SPACE W/IMG GUIDE  Result Date: 02/12/2022 INDICATION: Left pleural effusion EXAM: ULTRASOUND GUIDED Left THORACENTESIS MEDICATIONS: 10 cc 1% lidocaine. COMPLICATIONS: None immediate. PROCEDURE: An ultrasound guided thoracentesis was thoroughly discussed with the patient and questions answered. The benefits, risks, alternatives and complications were also discussed. The patient understands and wishes to proceed with the procedure. Written consent was obtained. Ultrasound was performed to localize and mark an adequate pocket of fluid in the left chest. The area was then prepped and draped in the normal sterile fashion. 1% Lidocaine was used for local anesthesia. Under ultrasound guidance a Teressa Lower  catheter was introduced. Thoracentesis was performed. The catheter was removed and a dressing applied. FINDINGS: A total of approximately 300 cc of blood tinged fluid was removed. Samples were sent to the laboratory as requested by the clinical  team. IMPRESSION: Successful ultrasound guided left thoracentesis yielding 300 cc of pleural fluid. CXR: No PTX Read by Lavonia Drafts [facility] Electronically Signed   By: Corrie Mckusick D.O.   On: 02/12/2022 08:35   MR ABDOMEN W WO CONTRAST  Result Date: 02/11/2022 CLINICAL DATA:  Liver, pancreatic, and esophageal masses identified by CT EXAM: MRI ABDOMEN WITHOUT AND WITH CONTRAST TECHNIQUE: Multiplanar multisequence MR imaging of the abdomen was performed both before and after the administration of intravenous contrast. CONTRAST:  23m GADAVIST GADOBUTROL 1 MMOL/ML IV SOLN COMPARISON:  Same-day CT abdomen pelvis FINDINGS: Lower chest: Cardiomegaly. Small bilateral pleural effusions and associated atelectasis or consolidation. Hepatobiliary: Hepatomegaly, maximum coronal span 24.4 cm. Probable hepatic steatosis, however due to technical error only in phase images are submitted for review. Large, rim enhancing, internally hypoenhancing liver mass centered in the anterior left lobe of the liver, hepatic segments IVA and IVB, measuring 7.8 x 7.2 cm (series 13, image 31). No gallstones, gallbladder wall thickening, or biliary dilatation. Pancreas: Diffuse inflammatory fat stranding and fluid about the pancreas and adjacent retroperitoneum. Multiple, lobulated, rim enhancing fluid signal lesions throughout the pancreatic head and neck, largest centrally measuring 3.6 x 2.8 cm (series 6, image 27). Pancreatic ductal dilatation. Spleen: Splenomegaly, maximum coronal span 14.5 cm. Adrenals/Urinary Tract: Adrenal glands are unremarkable. Kidneys are normal, without renal calculi, solid lesion, or hydronephrosis. Stomach/Bowel: Rim enhancing, internally hypoenhancing mass or lymph node conglomerate centered about the gastroesophageal junction, measuring 5.0 x 3.9 cm (series 13, image 29). No evidence of bowel wall thickening, distention, or inflammatory changes. Vascular/Lymphatic: Aortic atherosclerosis. Rim enhancing,  internally hypoenhancing gastrohepatic ligament lymph node measuring up to 2.7 x 2.5 cm (series 12, image 40). Enlarged heterogeneously enhancing retroperitoneal lymph nodes measuring up to 2.3 x 1.6 cm (series 12, image 74). Other: No abdominal wall hernia. Anasarca small perihepatic and perisplenic ascites. Musculoskeletal: No acute or significant osseous findings. IMPRESSION: 1. Rim enhancing, likely necrotic mass or lymph node conglomerate centered about the gastroesophageal junction, measuring 5.0 x 3.9 cm. 2. Large, rim enhancing, likely necrotic liver mass centered in the anterior left lobe of the liver, measuring 7.8 x 7.2 cm. 3. Enlarged gastrohepatic ligament and retroperitoneal lymph nodes. 4. Constellation of findings is most consistent with primary esophageal malignancy and associated metastatic disease. 5. Diffuse inflammatory fat stranding and fluid about the pancreas and adjacent retroperitoneum, with multiple, lobulated, rim enhancing fluid signal lesions throughout the pancreatic head and neck, largest centrally measuring 3.6 x 2.8 cm. Findings are most consistent with acute pancreatitis complicated by acute pancreatic fluid collections, the character and fluid composition of these pancreatic lesions distinct from esophageal and liver masses. 6. Small bilateral pleural effusions and associated atelectasis or consolidation. 7. Anasarca. Electronically Signed   By: ADelanna AhmadiM.D.   On: 02/11/2022 19:29   DG Chest 1 View  Result Date: 02/11/2022 CLINICAL DATA:  Status post thoracentesis EXAM: CHEST  1 VIEW COMPARISON:  02/11/2022 FINDINGS: Trace left pleural effusion. No focal consolidation. No pneumothorax. No right pleural effusion. Stable cardiomegaly. No acute osseous abnormality. IMPRESSION: 1. Trace left pleural effusion. No pneumothorax. Electronically Signed   By: HKathreen DevoidM.D.   On: 02/11/2022 10:33   CT ABDOMEN PELVIS W CONTRAST  Result Date: 02/11/2022 CLINICAL DATA:  Liver  mass on CT chest. Evaluate for metastatic disease. * Tracking Code: BO * EXAM: CT ABDOMEN AND PELVIS WITH CONTRAST TECHNIQUE: Multidetector CT imaging of the abdomen and pelvis was performed using the standard protocol following bolus administration of intravenous contrast. RADIATION DOSE REDUCTION: This exam was performed according to the departmental dose-optimization program which includes automated exposure control, adjustment of the mA and/or kV according to patient size and/or use of iterative reconstruction technique. CONTRAST:  53m OMNIPAQUE IOHEXOL 300 MG/ML  SOLN COMPARISON:  Chest CTA earlier same day FINDINGS: Lower chest: Bibasilar collapse/consolidation with small pleural effusions, left greater than right. Hepatobiliary: 8.7 x 7.9 x 7.4 cm ill-defined irregular mass is identified in the medial segment left liver. This appears to be isolated with no other suspicious mass lesion evident within the hepatic parenchyma. Gallbladder is nondistended with apparent trace amount of gas in the gallbladder lumen (see axial 35/3). No substantial intrahepatic biliary duct dilatation with extrahepatic common bile duct not well seen in the head of the pancreas but measuring approximately 6 mm diameter just proximal to the ampulla. Pancreas: There is diffuse peripancreatic edema with ill-defined parenchyma in the head of the pancreas including a 3.0 x 2.9 cm hypoenhancing ill-defined focal lesion visible on 41/3. No substantial main duct dilatation evident. Spleen: No splenomegaly. No focal mass lesion. Adrenals/Urinary Tract: No adrenal nodule or mass. Kidneys unremarkable. No evidence for hydroureter. The urinary bladder appears normal for the degree of distention. Stomach/Bowel: Stomach is unremarkable. No gastric wall thickening. No evidence of outlet obstruction. Duodenum is normally positioned as is the ligament of Treitz. No small bowel wall thickening. No small bowel dilatation. The terminal ileum is normal.  The appendix is normal. Diverticuli are seen scattered along the entire length of the colon without CT findings of diverticulitis. Vascular/Lymphatic: There is moderate atherosclerotic calcification of the abdominal aorta without aneurysm. 2.4 cm necrotic lymph node identified in the gastrohepatic ligament on image 30/3. 4.5 x 3.4 cm necrotic mass lesion is identified just cranial to the esophagogastric junction on image 22/3. 16 mm short axis portal caval lymph node is seen on 30/7/3. Calcified para-aortic nodal tissue is seen in the retroperitoneal space. Mild external iliac lymphadenopathy seen bilaterally (right on 75/3 and left on 76/3) nodular irregular peritoneal thickening noted in the lower abdomen and pelvis (see left pelvis on 73/3). Reproductive: The prostate gland and seminal vesicles are unremarkable. Other: Trace free fluid is seen in the pelvis. Small volume free fluid seen adjacent to the liver and spleen tracking into the paracolic gutter bilaterally. Musculoskeletal: Minimal presacral edema evident. No worrisome lytic or sclerotic osseous abnormality. Status post L5-S1 fusion IMPRESSION: 1. 8.7 x 7.9 x 7.4 cm ill-defined irregular mass in the medial segment left liver. Given findings below, metastatic disease is favored although primary hepatic neoplasm is not entirely excluded. 2. Diffuse peripancreatic edema with ill-defined parenchyma in the head of the pancreas including a 3.0 x 2.9 cm hypoenhancing ill-defined focal pancreatic head lesion. Imaging features are considered suspicious for primary pancreatic adenocarcinoma. MRI abdomen with and without contrast may prove helpful to further evaluate. Endoscopic ultrasound likely prove helpful. 3. 4.5 x 3.4 cm necrotic mass lesion is identified just cranial to the esophagogastric junction with additional metastatic lymphadenopathy in the abdomen and pelvis. 4. Irregular nodular peritoneal thickening in the lower abdomen and pelvis, concerning for  metastatic disease. 5. Bibasilar collapse/consolidation with small pleural effusions, left greater than right. 6. Trace amount of gas in the gallbladder lumen, presumably related  to prior sphincterotomy. In the absence of prior sphincterotomy, ascending biliary infection would be a consideration. 7. Aortic Atherosclerosis (ICD10-I70.0). Electronically Signed   By: Misty Stanley M.D.   On: 02/11/2022 07:52   CT Angio Chest Pulmonary Embolism (PE) W or WO Contrast  Result Date: 02/11/2022 CLINICAL DATA:  Recent PE. Hypoxia this morning. Clinical concern for recurrent pulmonary embolus. EXAM: CT ANGIOGRAPHY CHEST WITH CONTRAST TECHNIQUE: Multidetector CT imaging of the chest was performed using the standard protocol during bolus administration of intravenous contrast. Multiplanar CT image reconstructions and MIPs were obtained to evaluate the vascular anatomy. RADIATION DOSE REDUCTION: This exam was performed according to the departmental dose-optimization program which includes automated exposure control, adjustment of the mA and/or kV according to patient size and/or use of iterative reconstruction technique. CONTRAST:  136m OMNIPAQUE IOHEXOL 350 MG/ML SOLN COMPARISON:  01/31/2022 FINDINGS: Cardiovascular: Heart is enlarged. Mild atherosclerotic calcification is noted in the wall of the thoracic aorta. Left lower lobe segmental pulmonary embolus is stable in appearance (see image 155/series 7. The tiny segmental pulmonary embolus of the right upper lobe is also not appreciably changed in the interval (142/7). No new filling defect within the opacified pulmonary arteries to suggest the presence of additional acute pulmonary embolus on today's study. Mediastinum/Nodes: No mediastinal lymphadenopathy. There is no hilar lymphadenopathy. The esophagus has normal imaging features. There is no axillary lymphadenopathy. Lungs/Pleura: Patchy ground-glass opacities with superimposed nodular component seen previously in  both upper lobes have resolved in the interval. There is a persistent 5 mm cavitary nodule in the anterior left upper lobe (76/6) bibasilar collapse/consolidation noted left greater than right. Small to moderate left and small right pleural effusions are progressive in the interval. Upper Abdomen: The liver shows diffusely decreased attenuation suggesting fat deposition. Subtle nodularity of liver contour raises the question of cirrhosis. 7.8 x 8.5 cm subtle hypoattenuating mass lesion is identified in segment IV, similar to prior. Small volume free fluid is seen adjacent to the liver and spleen. Musculoskeletal: No worrisome lytic or sclerotic osseous abnormality. Multiple bilateral anterior rib fractures again noted in this patient with a reported history of recent CPR. Review of the MIP images confirms the above findings. IMPRESSION: 1. Similar appearance nonocclusive segmental and subsegmental pulmonary embolus to the left lower and right upper lobes. No new acute pulmonary embolus. 2. Interval resolution of the bilateral ground-glass opacities with superimposed nodular component seen previously in both upper lobes. 3. Persistent 5 mm cavitary nodule in the anterior left upper lobe. Follow-up CT chest in 3 months recommended to ensure stability. 4. Bibasilar collapse/consolidation left greater than right. 5. Small to moderate left and small right pleural effusions are progressive in the interval. 6. 7.8 x 8.5 cm subtle hypoattenuating mass lesion in segment IV of the liver, similar to prior but new since 06/26/2020. This could be metastatic disease or a primary liver mass. Consider dedicated abdomen/pelvis CT with oral and intravenous contrast to further evaluate. 7. Multiple bilateral anterior rib fractures again noted in this patient with a reported history of recent CPR. 8. Small volume free fluid adjacent to the liver and spleen. 9. Hepatic steatosis. Subtle nodularity of liver contour raises the question of  cirrhosis. 10. Aortic Atherosclerosis (ICD10-I70.0). Critical Value/emergent results were called by telephone at the time of interpretation on 02/11/2022 at 5:34 am [facility] verbally acknowledged these results. Electronically Signed   By: EMisty StanleyM.D.   On: 02/11/2022 05:35   DG Chest Port 1  View  Result Date: 02/11/2022 CLINICAL DATA:  Shortness of breath. EXAM: PORTABLE CHEST 1 VIEW COMPARISON:  February 01, 2022 FINDINGS: The endotracheal tube, nasogastric tube and left-sided venous catheter seen on the prior study have been removed. The cardiac silhouette is enlarged and predominant stable, given differences in technique. There is marked severity calcification of the aortic arch. Marked severity atelectasis and/or infiltrate is seen within the retrocardiac region of the left lung base. A small left pleural effusion is also noted. No pneumothorax is identified. The visualized skeletal structures are unremarkable. IMPRESSION: 1. Marked severity left basilar atelectasis and/or infiltrate. 2. Small left pleural effusion. Electronically Signed   By: Virgina Norfolk M.D.   On: 02/11/2022 02:39       Assessment / Plan:    #11 61 year old white male status post cardiac arrest on 02/01/2022, prolonged CPR resulting in multiple rib fractures, acute inferior MI 100% RCA occlusion on cath-treated medically Also found to have an acute PE at that same time-discharged to rehab on Eliquis Discharge from the hospital 02/08/2022 to [facility]/rehab  Readmitted yesterday with progressive shortness of breath Found to have moderate left and small right effusions, underwent thoracentesis yesterday with removal of 300 cc on the left  Fluid exudative ,cytology is pending  Still has markedly elevated troponins  #2  Abd imaging yesterday with CT of the abdomen revealing a large liver lesion consistent with malignancy, and ill-defined pancreatic head with mild diffuse peripancreatic edema, also  noted to have some necrotic lymph nodes  MRI confirms the large left lobe liver lesion measuring 7.8 x 7.2 cm, there are enlarged gastrohepatic ligament and retroperitoneal lymph nodes, and either a necrotic mass or necrotic lymph node conglomerate centered about the GE junction measuring 5.0 x 3.9 cm raising the possibility of esophageal cancer as primary. The changes of the pancreas on MRI more consistent with an acute pancreatitis complicated by acute pancreatic fluid collections   Suspect he may have had an ischemic insult to the pancreas with the cardiac arrest. Interestingly really has no abdominal pain Also no complaints of dysphagia or odynophagia  Tumor markers are pending  #3 peripheral vascular disease status post left AKA #4 rheumatoid arthritis #5.  Iron deficiency anemia #6 multiple bilateral rib fractures secondary to CPR  Plan; patient is high risk for complications for any endoscopic evaluation/sedation given recent cardiac arrest, and inferior MI with persistent significant elevation of troponins He also has hypoxic respiratory failure currently requiring O2.-Work-up in progress, component of volume overload  Would await tumor markers Await pleural fluid cytology-if this is nondiagnostic then would consider CT-guided liver biopsy, as would not require sedation Also need to wait for Eliquis washout if and last dose Eliquis Friday p.m.  Okay to anticoagulate in the interim with heparin or Lovenox for the recent PE, then bridge around the time of any biopsy or procedure.  Continue to allow regular diet as he is asymptomatic from the perspective of any dysphagia or complaints of abdominal pain.    Principal Problem:   Acute respiratory failure with hypoxemia (HCC) Active Problems:   Rheumatoid arthritis (HCC)   HTN (hypertension)   Coronary artery disease involving native coronary artery of native heart with unstable angina pectoris (HCC)   PAD (peripheral artery  disease) ([place])   DVT (deep venous thrombosis) ([place])   Pulmonary emboli (HCC)   Acute respiratory failure with hypoxia ([place])     LOS: 1 day   Fischer Halley PA-C 02/12/2022, 10:45 AM

## 2022-02-12 NOTE — Plan of Care (Signed)

## 2022-02-13 DIAGNOSIS — D649 Anemia, unspecified: Secondary | ICD-10-CM

## 2022-02-13 DIAGNOSIS — J189 Pneumonia, unspecified organism: Secondary | ICD-10-CM | POA: Diagnosis not present

## 2022-02-13 DIAGNOSIS — I2510 Atherosclerotic heart disease of native coronary artery without angina pectoris: Secondary | ICD-10-CM | POA: Diagnosis not present

## 2022-02-13 DIAGNOSIS — J9601 Acute respiratory failure with hypoxia: Secondary | ICD-10-CM | POA: Diagnosis not present

## 2022-02-13 DIAGNOSIS — J90 Pleural effusion, not elsewhere classified: Secondary | ICD-10-CM | POA: Diagnosis not present

## 2022-02-13 LAB — BASIC METABOLIC PANEL
Anion gap: 11 (ref 5–15)
BUN: 18 mg/dL (ref 8–23)
CO2: 26 mmol/L (ref 22–32)
Calcium: 8.6 mg/dL — ABNORMAL LOW (ref 8.9–10.3)
Chloride: 98 mmol/L (ref 98–111)
Creatinine, Ser: 1.11 mg/dL (ref 0.61–1.24)
GFR, Estimated: >60 mL/min (ref >60)
Glucose, Bld: 95 mg/dL (ref 70–99)
Potassium: 4 mmol/L (ref 3.5–5.1)
Sodium: 135 mmol/L (ref 135–145)

## 2022-02-13 LAB — CBC
HCT: 33.4 % — ABNORMAL LOW (ref 39.0–52.0)
Hemoglobin: 10.3 g/dL — ABNORMAL LOW (ref 13.0–17.0)
MCH: 25.1 pg — ABNORMAL LOW (ref 26.0–34.0)
MCHC: 30.8 g/dL (ref 30.0–36.0)
MCV: 81.5 fL (ref 80.0–100.0)
Platelets: 364 10*3/uL (ref 150–400)
RBC: 4.1 MIL/uL — ABNORMAL LOW (ref 4.22–5.81)
RDW: 17.6 % — ABNORMAL HIGH (ref 11.5–15.5)
WBC: 11.2 10*3/uL — ABNORMAL HIGH (ref 4.0–10.5)
nRBC: 0 % (ref 0.0–0.2)

## 2022-02-13 LAB — TRIGLYCERIDES, BODY FLUIDS: Triglycerides, Fluid: 20 mg/dL

## 2022-02-13 LAB — HEPARIN LEVEL (UNFRACTIONATED)
Heparin Unfractionated: 0.94 IU/mL — ABNORMAL HIGH (ref 0.30–0.70)
Heparin Unfractionated: 0.68 IU/mL (ref 0.30–0.70)

## 2022-02-13 LAB — APTT: aPTT: 45 seconds — ABNORMAL HIGH (ref 24–36)

## 2022-02-13 LAB — CEA: CEA: 287 ng/mL — ABNORMAL HIGH (ref 0.0–4.7)

## 2022-02-13 LAB — AFP TUMOR MARKER: AFP, Serum, Tumor Marker: 2.4 ng/mL (ref 0.0–8.4)

## 2022-02-13 LAB — CANCER ANTIGEN 19-9: CA 19-9: 88 U/mL — ABNORMAL HIGH (ref 0–35)

## 2022-02-13 NOTE — Progress Notes (Signed)
ANTICOAGULATION CONSULT NOTE  Pharmacy Consult for Heparin  Indication: Acute PE - 8/8  Allergies  Allergen Reactions   Dilaudid [Hydromorphone Hcl] Rash   Ramipril Rash and Other (See Comments)    Per patient on R arm and leg only; no angioedema   Tramadol Itching and Rash    Patient Measurements: Height: '5\' 7"'$  (170.2 cm) Weight: 83 kg (182 lb 15.7 oz) IBW/kg (Calculated) : 66.1 Heparin Dosing Weight: 83.4 kg  Vital Signs: Temp: 98.8 F (37.1 C) (08/21 1057) Temp Source: Oral (08/21 1057) BP: 107/76 (08/21 1057) Pulse Rate: 96 (08/21 1057)  Labs: Recent Labs    02/11/22 0151 02/11/22 0405 02/11/22 0915 02/11/22 1238 02/11/22 1510 02/12/22 0428 02/13/22 0922  HGB 10.1*  --   --   --   --  9.1* 10.3*  HCT 32.2*  --   --   --   --  28.4* 33.4*  PLT 352  --   --   --   --  332 364  APTT  --   --   --   --   --   --  45*  LABPROT  --   --   --   --  18.2*  --   --   INR  --   --   --   --  1.5*  --   --   HEPARINUNFRC  --   --   --   --   --   --  0.94*  CREATININE 1.15  --   --   --   --  1.08 1.11  TROPONINIHS 1,129* 1,160* 933* 957*  --   --   --      Estimated Creatinine Clearance: 72.1 mL/min (by C-G formula based on SCr of 1.11 mg/dL).   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   Hypertension    states is borderline on medication; has been on med. x 5-6 yr.   Hypokalemia 05/30/2018   Impingement syndrome of shoulder region 10/2014   left   Insomnia 06/01/2015   Ischemic ulcer of toe of left foot (HCC)    Left shoulder pain 12/16/2015   Low back pain without sciatica 10/29/2009   Lupus ([place])    Peripheral vascular disease (HCC)    Pneumonia    RA (rheumatoid arthritis)  ([place])    Rotator cuff tear 11/04/2014    Assessment: 61 yo male presented with SOB. Recent BLE DVT per venous duplex 01/11/20, started 01/10/22 on Eliquis (PTA LD 8/18 '@2100'$ ) treatment dose and Plavix for PAD. Recent acute PE 01/31/22, CT from this admission shows prior PE unchanged. Patient received therapeutic lovenox 8/19-8/20 (last dose 8/20 at ~ 9am). Pharmacy consulted for IV heparin dosing until biopsy complete. -heparin level= 0.94 on 1300 units/hr, Hg= 10.3   Goal of Therapy:  Heparin level 0.3-0.7 Monitor platelets by anticoagulation protocol: Yes   Plan:  -Decrease heparin to 1100 units/hr -Heparin level in 6 hours and daily wth CBC daily  Hildred Laser, PharmD Clinical Pharmacist **Pharmacist phone directory can now be found on [email or web address] ([email or web address]).  Listed under [place].

## 2022-02-13 NOTE — Progress Notes (Signed)
ANTICOAGULATION CONSULT NOTE  Pharmacy Consult for Heparin  Indication: Acute PE - 8/8  Allergies  Allergen Reactions   Dilaudid [Hydromorphone Hcl] Rash   Ramipril Rash and Other (See Comments)    Per patient on R arm and leg only; no angioedema   Tramadol Itching and Rash    Patient Measurements: Height: '5\' 7"'$  (170.2 cm) Weight: 83 kg (182 lb 15.7 oz) IBW/kg (Calculated) : 66.1 Heparin Dosing Weight: 83.4 kg  Vital Signs: Temp: 98.8 F (37.1 C) (08/21 1057) Temp Source: Oral (08/21 1057) BP: 107/76 (08/21 1057) Pulse Rate: 96 (08/21 1057)  Labs: Recent Labs    02/11/22 0151 02/11/22 0405 02/11/22 0915 02/11/22 1238 02/11/22 1510 02/12/22 0428 02/13/22 0922 02/13/22 1820  HGB 10.1*  --   --   --   --  9.1* 10.3*  --   HCT 32.2*  --   --   --   --  28.4* 33.4*  --   PLT 352  --   --   --   --  332 364  --   APTT  --   --   --   --   --   --  45*  --   LABPROT  --   --   --   --  18.2*  --   --   --   INR  --   --   --   --  1.5*  --   --   --   HEPARINUNFRC  --   --   --   --   --   --  0.94* 0.68  CREATININE 1.15  --   --   --   --  1.08 1.11  --   TROPONINIHS 1,129* 1,160* 933* 957*  --   --   --   --     Estimated Creatinine Clearance: 72.1 mL/min (by C-G formula based on SCr of 1.11 mg/dL).   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   Hypertension    states is borderline on medication; has been on med. x 5-6 yr.   Hypokalemia 05/30/2018   Impingement syndrome of shoulder region 10/2014   left   Insomnia 06/01/2015   Ischemic ulcer of toe of left foot (HCC)    Left shoulder pain 12/16/2015   Low back pain without sciatica 10/29/2009   Lupus ([place])    Peripheral vascular  disease (HCC)    Pneumonia    RA (rheumatoid arthritis) ([place])    Rotator cuff tear 11/04/2014    Assessment: 61 yo male presented with SOB. Recent BLE DVT per venous duplex 01/11/20, started 01/10/22 on Eliquis (PTA LD 8/18 '@2100'$ ) treatment dose and Plavix for PAD. Recent acute PE 01/31/22, CT from this admission shows prior PE unchanged. Patient received therapeutic lovenox 8/19-8/20 (last dose 8/20 at ~ 9am). Pharmacy consulted for IV heparin dosing until biopsy complete.   Heparin level 0.68 on drip rate 1100 units/hr therapeutic. Last heparin level supratherapeutic; first level in therapeutic range. Hgb 10.3 and plt 364. No s/sx bleeding noted in chart. Given acute PE, will aim to keep heparin level at higher range  of goal.     Goal of Therapy:  Heparin level 0.3-0.7 Monitor platelets by anticoagulation protocol: Yes   Plan:  Continue heparin infusion at 1100 units/hr  Monitor daily heparin level and CBC Continue to monitor H&H     Gena Fray, PharmD PGY1 Pharmacy Resident   02/13/2022 7:45 PM

## 2022-02-13 NOTE — Progress Notes (Signed)
NAME:  [name] [name], MRN:  [id number], DOB:  05/27/1961, LOS: 2 ADMISSION DATE:  02/11/2022  Subjective  Patient evaluated at bedside this AM. Patient is having persistent rib pain especially with deep breathing and this is his main concern. He had a cough with sputum production for the past 3-4 days which resolved this morning. He denies N/V, hemoptysis/hematemesis, dysphagia, weight loss. He denies alcohol use but does have a significant tobacco use. He is hungry.  Objective   Blood pressure 107/76, pulse 96, temperature 98.8 F (37.1 C), temperature source Oral, resp. rate 19, height '5\' 7"'$  (1.702 m), weight 83 kg, SpO2 96 %.     Intake/Output Summary (Last 24 hours) at 02/13/2022 1352 Last data filed at 02/13/2022 1058 Gross per 24 hour  Intake 600.74 ml  Output 1050 ml  Net -449.26 ml    Filed Weights   02/11/22 0138 02/11/22 1909 02/12/22 0701  Weight: 79.4 kg 85.1 kg 83 kg   Physical Exam: General: laying in bed, pleasant, no distress, chronically ill-appearing CV: regular rate and rhythm, no r/m/g, no JVD Pulm: Normal work of breathing on 3L supplemental oxygen. Crackles and end expiratory wheezing diffusely through all lung fields Abdomen: Soft, non-tender, non-distended. Normoactive bowel sounds. Msk: left aka, no R LE Skin: warm and dry Neuro: Awake, alert, conversing appropriately. Grossly non-focal  Labs       Latest Ref Rng & Units 02/13/2022    9:22 AM 02/12/2022    4:28 AM 02/11/2022    1:51 AM  CBC  WBC 4.0 - 10.5 K/uL 11.2  11.7  12.9   Hemoglobin 13.0 - 17.0 g/dL 10.3  9.1  10.1   Hematocrit 39.0 - 52.0 % 33.4  28.4  32.2   Platelets 150 - 400 K/uL 364  332  352       Latest Ref Rng & Units 02/13/2022    9:22 AM 02/12/2022    4:28 AM 02/11/2022    1:51 AM  BMP  Glucose 70 - 99 mg/dL 95  154  92   BUN 8 - 23 mg/dL '18  19  18   '$ Creatinine 0.61 - 1.24 mg/dL 1.11  1.08  1.15   Sodium 135 - 145 mmol/L 135  135  134   Potassium 3.5 - 5.1 mmol/L 4.0  3.8   3.3   Chloride 98 - 111 mmol/L 98  102  100   CO2 22 - 32 mmol/L '26  24  23   '$ Calcium 8.9 - 10.3 mg/dL 8.6  7.9  7.8    Summary   [name] [name] is 61yo person with recent inferior STEMI and PE/DVT, peripheral arterial disease s/p L AKA 2021, rheumatoid arthritis not on DMARD admitted 8/19 with acute hypoxic respiratory failure and found to have incidental finding of multiple intra-abdominal masses c/f primary esophageal malignancy with metastatic disease.  Assessment & Plan:  Principal Problem:   Acute respiratory failure with hypoxemia (HCC) Active Problems:   Rheumatoid arthritis (HCC)   HTN (hypertension)   Coronary artery disease involving native coronary artery of native heart with unstable angina pectoris (HCC)   PAD (peripheral artery disease) (HCC)   DVT (deep venous thrombosis) (HCC)   Pulmonary emboli (HCC)   Acute respiratory failure with hypoxia (HCC)   Liver mass   Abnormal CT scan, gastrointestinal tract  Malignancy of unknown originExudative L pleural effusion s/p thoracentesis DVT, PE Pt presented with respiratory failure and CT chest demonstrated hepatic mass. Patient endorses recent night sweats  and long standing tobacco use but denies recent weight loss,hemoptysis, dysphagia or alcohol use.MRI 8/19 revealed rim enhancing necrotic masses around GE junction and in the anterior left lobe of the liver along with enlarged intra-abdominal lymph nodes, concerning for metastatic esophageal cancer. The patients exudative pleural effusion supports esophageal malignancy, although could represent sequelae of recent cpr, CAP PNA, pancreatic inflammation per imaging, chronic PE. Will follow up pleural cytology and AFPCEACA 19-9 to help with work up and consider possible liver biopsy if no etiology found.  - Follow-up tumor markers - Hold Eliquis, continue heparin ggt  Acute hypoxic respiratory failureCommunity-acquired pneumonia Exudative L pleural effusion s/p  thoracentesis The patient presented with acute hypoxic respiratory failure after recent admission 01/31/22 for STEMI. Repeat CT chest 8/19 showed persistent subsegmental DVT and interval improvement in ground glass opacities found during admission 8/8. BNP was elevated to 300 with evidence of volume overload, now euvolemic on exam s/p IV lasix. Do not suspect this was overt CHF exacerbation.The patient does report 3-4 days of cough and sputum production that resolved this AM s/p antibiotics. WBC continues to decrease to 11.2 today. Without differential it is unclear if this was elevated due to left shift or demargination from stress given concerning presentation for malignancy or malignancy itself. There is also concern that the suspected malignant effusion is causing respiratory compromise. Would say that the malignant effusion is likely the main cause of acute respiratory failure and some volume overload could have been exacerbating. I am not convinced this is CAP but given that he is improving with only antibiotic intervention, will treat for full CAP course. - Continue ceftriaxone and azithromycin - Pending Legionella, strep pneumo urine antigens - Daily CBC, BMP - Daily weights, strict ins and outs  History of recent inferior STEMI Coronary artery disease No chest pain and no ACS per admission trops and ekg.Holding Plavix in setting of upcoming biopsy. Hemodynamically stable. - Continue home atorvastatin, metoprolol - Holding home amlodipine, chlorthalidone in setting of normotension  Normocytic anemia Hgb stable this morning at 10.3. Iron studies yesterday revealed ferritin 151, Fe 13, sat 5%. Given normocytic anemia with low iron/saturation and normal ferritin, suspect early anemia of chronic disease in the setting of malignancy. Will continue to monitor.  Best practice:  DIET: HH IVF: n/a DVT PPX: heparin BOWEL: n/a CODE: FULL FAM COM: n/a  [name] Dame, MD Internal Medicine  Resident PGY-3 PAGER: [phone] 02/13/2022 1:52 PM  If after hours (below), please contact on-call pager: [phone] 5PM-7AM Monday-Friday 1PM-7AM Saturday-Sunday

## 2022-02-13 NOTE — Plan of Care (Signed)

## 2022-02-14 LAB — CBC
HCT: 29 % — ABNORMAL LOW (ref 39.0–52.0)
Hemoglobin: 9.3 g/dL — ABNORMAL LOW (ref 13.0–17.0)
MCH: 25.7 pg — ABNORMAL LOW (ref 26.0–34.0)
MCHC: 32.1 g/dL (ref 30.0–36.0)
MCV: 80.1 fL (ref 80.0–100.0)
Platelets: 301 10*3/uL (ref 150–400)
RBC: 3.62 MIL/uL — ABNORMAL LOW (ref 4.22–5.81)
RDW: 17.4 % — ABNORMAL HIGH (ref 11.5–15.5)
WBC: 10.8 10*3/uL — ABNORMAL HIGH (ref 4.0–10.5)
nRBC: 0 % (ref 0.0–0.2)

## 2022-02-14 LAB — BASIC METABOLIC PANEL
Anion gap: 10 (ref 5–15)
BUN: 21 mg/dL (ref 8–23)
CO2: 24 mmol/L (ref 22–32)
Calcium: 8.6 mg/dL — ABNORMAL LOW (ref 8.9–10.3)
Chloride: 102 mmol/L (ref 98–111)
Creatinine, Ser: 1.3 mg/dL — ABNORMAL HIGH (ref 0.61–1.24)
GFR, Estimated: >60 mL/min (ref >60)
Glucose, Bld: 114 mg/dL — ABNORMAL HIGH (ref 70–99)
Potassium: 3.8 mmol/L (ref 3.5–5.1)
Sodium: 136 mmol/L (ref 135–145)

## 2022-02-14 LAB — CYTOLOGY - NON PAP

## 2022-02-14 LAB — HEPARIN LEVEL (UNFRACTIONATED): Heparin Unfractionated: 0.53 IU/mL (ref 0.30–0.70)

## 2022-02-14 LAB — CHOLESTEROL, BODY FLUID: Cholesterol, Fluid: 35 mg/dL

## 2022-02-14 NOTE — Plan of Care (Signed)

## 2022-02-14 NOTE — Hospital Course (Signed)
[name] [name] is a 61 year old male with past medical history of rheumatoid arthritis, PAD status post left AKA 2021, CAD, PE and DVT on DOAC, recent cardiac arrest from inferior STEMI 8/8 who presented to the ED for shortness of breath 8/19 following discharge 8/16.  Malignancy of unknown originExudative L pleural effusion s/p thoracentesis DVT(R popliteal/peroneal/posterior tibial), PE Pt presented with respiratory failure and CT chest demonstrated persistent nonocclusive segmental and subsegmental pulmonary embolus to the left lower and right upper lobes,improving bilateral ground glass opacities likely infectious vs post cpr contusion, hepatic mass. Patient endorses recent night sweats and long standing tobacco use but denies recent weight loss,hemoptysis, dysphagia or alcohol use.Following CT A/P with findings of GE junction mas, hepatic mass, c/f pancreatic mass, MRI 8/19 revealed rim enhancing necrotic masses around GE junction and in the anterior left lobe of the liver along with enlarged intra-abdominal lymph nodes, evidence of edematous pancreatitis, concerning for metastatic esophageal cancer. The patient was also found to have a L exudative pleural effusion with 0.3L on thoracentesis, which supports esophageal malignancy, although could represent sequelae of recent cpr, CAP PNA, pancreatic inflammation per imaging, chronic PE. Pleural cytology was negative but doesn't rule out malignancy. Tumor markers were significant for AFP wnl, CEA elevated to 287,CA 19-9 elevated to 88. IR biopsied the liver lesion on 8/23 with a technically successful biopsy. Oncology was notified of the patient and scheduled to follow up outpatient. Liver biopsy path pending at discharge.    Acute hypoxic respiratory failureCommunity-acquired pneumonia Exudative L pleural effusion s/p thoracentesis The patient presented with new acute hypoxic respiratory failure after recent admission for STEMI. Repeat CT chest 8/19  showed persistent subsegmental DVT and interval improvement in ground glass opacities found during admission 8/8. BNP was elevated to 300 with evidence of volume overload. Following IV Lasix 20x1 and 40x1 the patient returned to euvolemic on physical exam. Do not suspect this was overt CHF exacerbation.The patient does report 3-4 days of cough and sputum production that resolved with antibiotics: IV ceftriaxone 8/19-8/22, IV azithromycin 8/19-8/20,Oral Azithrmycin 8/21-8/22. He also had a 5 day course of IV unasyn during his admission on 8/8.WBC was at 13.6 on admission and at 8.8 on discharge. Without differential it is unclear if this was elevated due to left shift or demargination from stress given concerning presentation for malignancy or malignancy itself. There is also concern that the effusion was causing respiratory compromise, which had thoracentesis of 0.3L and was found to be exudative without malignant cells.  The patient has a mid sternal fracture and bilateral anterior ribs 2-7 fracture post CPR. He was weaned to 2L O2 at discharge. The patient ultimately had a multifactorial respiratory compromise likely driven by rib fractures primarily.  Bilateral 2-7 anterior rib fractures Mid sternal fracture Pain treated with scheduled percocet and prn oxycodone. Discharged with appropriate pain regimen and home 02 of**.    History of recent inferior STEMI Coronary artery disease No chest pain and no ACS per admission trops and ekg.Continued home atorvastatin, metoprolol. Held home amlodipine, chlorthalidone in setting of normotension.   Normocytic anemia Hgb at discharge was 10.3 with a decreased MCV to 79.3 and an increased RDW. Iron studies revealed ferritin 151, Fe 13, sat 5%. This may reflect iron deficiency anemia with a normalized ferritin given a false elevation as an acute phase reactant.  Pulmonary Nodule Persistent 5 mm cavitary nodule in the anterior left upper lobe. Follow-up CT  chest in 3 months recommended to ensure stability.

## 2022-02-14 NOTE — Care Management (Signed)
02-14-22 1004 Case Manager received consult for medication assistance. Patient has [facility]. Unfortunately, since patient has insurance Case Manager is unable to assist with medication co pay cost. If a certain medication needs to be checked for cost please place that in the consult. No further needs identified at this time.

## 2022-02-14 NOTE — Progress Notes (Signed)
NAME:  [name] [name], MRN:  [id number], DOB:  02/08/1961, LOS: 3 ADMISSION DATE:  02/11/2022  Subjective  Patient evaluated at bedside this AM. Continues to have rib pain. Has left sided chest pressure on and off but not at the time of discharge that is not worsened with exertion or palpation and the patient has had it before. There is no associated SOB or palpitations. He is otherwise doing well, had a good BM and having good appetite. He is pleasant and just waiting a final diagnosis.  Objective   Blood pressure 125/88, pulse 89, temperature 97.8 F (36.6 C), temperature source Oral, resp. rate 15, height '5\' 7"'$  (1.702 m), weight 80.8 kg, SpO2 96 %.     Intake/Output Summary (Last 24 hours) at 02/14/2022 1457 Last data filed at 02/14/2022 1358 Gross per 24 hour  Intake 212.21 ml  Output 2050 ml  Net -1837.79 ml    Filed Weights   02/11/22 1909 02/12/22 0701 02/14/22 0343  Weight: 85.1 kg 83 kg 80.8 kg   Physical Exam: General: laying in bed, pleasant, no distress, chronically ill-appearing and flushed CV: regular rate and rhythm, no r/m/g, no JVD, pulses 2+ Pulm: Normal work of breathing on 3L supplemental oxygen. Crackles heard diffusely but worse at the bases, no wheezing, good air movement throughout Abdomen: Soft, non-tender, non-distended. Normoactive bowel sounds. Msk: left aka, no R LE, RLE warm and well perfused Skin: warm and dry Neuro: Awake, alert, conversing appropriately. Grossly non-focal  Labs       Latest Ref Rng & Units 02/14/2022    3:42 AM 02/13/2022    9:22 AM 02/12/2022    4:28 AM  CBC  WBC 4.0 - 10.5 K/uL 10.8  11.2  11.7   Hemoglobin 13.0 - 17.0 g/dL 9.3  10.3  9.1   Hematocrit 39.0 - 52.0 % 29.0  33.4  28.4   Platelets 150 - 400 K/uL 301  364  332       Latest Ref Rng & Units 02/14/2022    3:42 AM 02/13/2022    9:22 AM 02/12/2022    4:28 AM  BMP  Glucose 70 - 99 mg/dL 114  95  154   BUN 8 - 23 mg/dL '21  18  19   '$ Creatinine 0.61 - 1.24 mg/dL 1.30   1.11  1.08   Sodium 135 - 145 mmol/L 136  135  135   Potassium 3.5 - 5.1 mmol/L 3.8  4.0  3.8   Chloride 98 - 111 mmol/L 102  98  102   CO2 22 - 32 mmol/L '24  26  24   '$ Calcium 8.9 - 10.3 mg/dL 8.6  8.6  7.9    Summary   [name] [name] is 61yo person with recent inferior STEMI and PE/DVT, peripheral arterial disease s/p L AKA 2021, rheumatoid arthritis not on DMARD admitted 8/19 with acute hypoxic respiratory failure and found to have incidental finding of multiple intra-abdominal masses c/f primary esophageal malignancy with metastatic disease.  Assessment & Plan:  Principal Problem:   Acute respiratory failure with hypoxemia (HCC) Active Problems:   Rheumatoid arthritis (HCC)   HTN (hypertension)   Coronary artery disease involving native coronary artery of native heart with unstable angina pectoris (HCC)   PAD (peripheral artery disease) (HCC)   DVT (deep venous thrombosis) (HCC)   Pulmonary emboli (HCC)   Multiple fractures of ribs, left side, initial encounter for closed fracture   Acute respiratory failure with hypoxia ([place])  Liver mass   Abnormal CT scan, gastrointestinal tract  Malignancy of unknown originExudative L pleural effusion s/p thoracentesis DVT, PE There is high likely hood of malignancy given new GE junction and liver masses, retroperitoneal node enlargement and extremely elevated CEA and CA-19-9 tumor markers. There was thought that his L exudative effusion could be from primary esophageal malignancy. Cytology returned without malignant cells although this does not preclude malignancy. IR has been formally consulted to evaluate possible liver biopsy-concern per GI for high risk of mortality with anesthesia if endoscopic esophageal biopsy is performed given recent STEMI 8/8. Have also reached out to GI to discuss tumor markers and cytology to guide the plan moving forward. - Hold Eliquis, continue heparin ggt -IR consulted  Acute hypoxic respiratory  failureCommunity-acquired pneumonia-resolved The patient has had improving volume status following IV diuresis, 0.3L thoracentesis, and IV antibiotics for potential CAP. He remains on 3L [place] with appropriate oxygen saturation and no subjective difficulty breathing. Given a 5 day course of IV Unasyn at last admission and 4 days of IV antibiotics this admission, will stop antibiotics for CAP and observe.  - Disontinue ceftriaxone and azithromycin - Daily CBC, BMP - Daily weights, strict ins and outs  Exudative L pleural effusion s/p thoracentesis Cytology came back today without any malignant cells. Does not exclude primary esophageal malignancy. This could also be due to recent CPR, known DVT, image findings consistent with pancreatitis, recently treated PNA. Can consider repeat imaging for interval worsening if unable to wean patient off of O2 given persistent crackles on exam.  History of recent inferior STEMI Coronary artery disease No chest pain and no ACS per admission trops and ekg.Holding Plavix in setting of upcoming biopsy. Hemodynamically stable. - Continue home atorvastatin, metoprolol - Holding home amlodipine, chlorthalidone in setting of normotension  Normocytic anemia Hgb stable with slight drop from 10.3 to 9.3 but no melena or hemoptysis or other signs of bleed. Iron studies yesterday revealed ferritin 151, Fe 13, sat 5%. Given normocytic anemia with low iron/saturation and normal ferritin, suspect early anemia of chronic disease in the setting of malignancy. Will continue to monitor.  Best practice:  DIET: HH IVF: n/a DVT PPX: heparin BOWEL: n/a CODE: FULL FAM COM: n/a  [name] Dame, MD Internal Medicine Resident PGY-3 PAGER: [phone] 02/14/2022 2:57 PM  If after hours (below), please contact on-call pager: [phone] 5PM-7AM Monday-Friday 1PM-7AM Saturday-Sunday

## 2022-02-14 NOTE — Progress Notes (Signed)
ANTICOAGULATION CONSULT NOTE  Pharmacy Consult for Heparin  Indication: Acute PE - 8/8  Allergies  Allergen Reactions   Dilaudid [Hydromorphone Hcl] Rash   Ramipril Rash and Other (See Comments)    Per patient on R arm and leg only; no angioedema   Tramadol Itching and Rash    Patient Measurements: Height: '5\' 7"'$  (170.2 cm) Weight: 80.8 kg (178 lb 1.6 oz) IBW/kg (Calculated) : 66.1 Heparin Dosing Weight: 83.4 kg  Vital Signs: Temp: 98.1 F (36.7 C) (08/22 0343) Temp Source: Oral (08/22 0343) BP: 125/88 (08/22 0700) Pulse Rate: 89 (08/22 0700)  Labs: Recent Labs    02/11/22 0915 02/11/22 1238 02/11/22 1510 02/12/22 0428 02/12/22 0428 02/13/22 0922 02/13/22 1820 02/14/22 0342  HGB  --   --   --  9.1*   < > 10.3*  --  9.3*  HCT  --   --   --  28.4*  --  33.4*  --  29.0*  PLT  --   --   --  332  --  364  --  301  APTT  --   --   --   --   --  45*  --   --   LABPROT  --   --  18.2*  --   --   --   --   --   INR  --   --  1.5*  --   --   --   --   --   HEPARINUNFRC  --   --   --   --   --  0.94* 0.68 0.53  CREATININE  --   --   --  1.08  --  1.11  --  1.30*  TROPONINIHS 933* 957*  --   --   --   --   --   --    < > = values in this interval not displayed.     Estimated Creatinine Clearance: 60.8 mL/min (A) (by C-G formula based on SCr of 1.3 mg/dL (H)).   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   Hypertension    states is borderline on medication; has been on med. x 5-6 yr.   Hypokalemia 05/30/2018   Impingement syndrome of shoulder region 10/2014   left   Insomnia 06/01/2015   Ischemic ulcer of toe of left foot (HCC)    Left shoulder pain 12/16/2015   Low back pain without  sciatica 10/29/2009   Lupus ([place])    Peripheral vascular disease (HCC)    Pneumonia    RA (rheumatoid arthritis) ([place])    Rotator cuff tear 11/04/2014    Assessment: 61 yo male presented with SOB. Recent BLE DVT per venous duplex 01/11/20, started 01/10/22 on Eliquis (PTA LD 8/18 '@2100'$ ) treatment dose and Plavix for PAD. Recent acute PE 01/31/22, CT from this admission shows prior PE unchanged. Patient received therapeutic lovenox 8/19-8/20 (last dose 8/20 at ~ 9am). Pharmacy consulted for IV heparin dosing until biopsy complete.  -Heparin level 0.53 on drip rate 1100 units/hr therapeutic.      Goal of Therapy:  Heparin level 0.3-0.7 Monitor platelets by anticoagulation  protocol: Yes   Plan:  Continue heparin infusion at 1100 units/hr  Monitor daily heparin level and CBC  Hildred Laser, PharmD Clinical Pharmacist **Pharmacist phone directory can now be found on [email or web address] ([email or web address]).  Listed under [place].

## 2022-02-15 ENCOUNTER — Other Ambulatory Visit: Payer: Self-pay

## 2022-02-15 ENCOUNTER — Inpatient Hospital Stay (HOSPITAL_COMMUNITY): Payer: Medicare Other

## 2022-02-15 LAB — BASIC METABOLIC PANEL
Anion gap: 9 (ref 5–15)
BUN: 20 mg/dL (ref 8–23)
CO2: 24 mmol/L (ref 22–32)
Calcium: 9 mg/dL (ref 8.9–10.3)
Chloride: 100 mmol/L (ref 98–111)
Creatinine, Ser: 0.97 mg/dL (ref 0.61–1.24)
GFR, Estimated: >60 mL/min (ref >60)
Glucose, Bld: 137 mg/dL — ABNORMAL HIGH (ref 70–99)
Potassium: 4.1 mmol/L (ref 3.5–5.1)
Sodium: 133 mmol/L — ABNORMAL LOW (ref 135–145)

## 2022-02-15 LAB — CBC
HCT: 31.7 % — ABNORMAL LOW (ref 39.0–52.0)
Hemoglobin: 10 g/dL — ABNORMAL LOW (ref 13.0–17.0)
MCH: 25.4 pg — ABNORMAL LOW (ref 26.0–34.0)
MCHC: 31.5 g/dL (ref 30.0–36.0)
MCV: 80.7 fL (ref 80.0–100.0)
Platelets: 290 10*3/uL (ref 150–400)
RBC: 3.93 MIL/uL — ABNORMAL LOW (ref 4.22–5.81)
RDW: 17.1 % — ABNORMAL HIGH (ref 11.5–15.5)
WBC: 8.7 10*3/uL (ref 4.0–10.5)
nRBC: 0 % (ref 0.0–0.2)

## 2022-02-15 LAB — HEPARIN LEVEL (UNFRACTIONATED): Heparin Unfractionated: 0.22 IU/mL — ABNORMAL LOW (ref 0.30–0.70)

## 2022-02-15 MED ORDER — LIDOCAINE-EPINEPHRINE 1 %-1:100000 IJ SOLN
INTRAMUSCULAR | Status: AC
  Filled 2022-02-15: qty 1

## 2022-02-15 MED ORDER — MIDAZOLAM HCL 2 MG/2ML IJ SOLN
INTRAMUSCULAR | Status: AC
  Filled 2022-02-15: qty 2

## 2022-02-15 MED ORDER — MIDAZOLAM HCL 2 MG/2ML IJ SOLN
INTRAMUSCULAR | Status: AC | PRN
  Administered 2022-02-15: 1 mg via INTRAVENOUS

## 2022-02-15 MED ORDER — OXYCODONE HCL 5 MG PO TABS
5.0000 mg | ORAL_TABLET | Freq: Four times a day (QID) | ORAL | Status: DC | PRN
  Administered 2022-02-15 – 2022-02-16 (×3): 5 mg via ORAL
  Filled 2022-02-15 (×3): qty 1

## 2022-02-15 MED ORDER — APIXABAN 5 MG PO TABS
5.0000 mg | ORAL_TABLET | Freq: Two times a day (BID) | ORAL | Status: DC
  Administered 2022-02-16 – 2022-02-17 (×3): 5 mg via ORAL
  Filled 2022-02-15 (×3): qty 1

## 2022-02-15 MED ORDER — APIXABAN 5 MG PO TABS
5.0000 mg | ORAL_TABLET | Freq: Two times a day (BID) | ORAL | Status: DC
Start: 2022-02-15 — End: 2022-02-15

## 2022-02-15 MED ORDER — FENTANYL CITRATE (PF) 100 MCG/2ML IJ SOLN
INTRAMUSCULAR | Status: AC | PRN
  Administered 2022-02-15: 25 ug via INTRAVENOUS

## 2022-02-15 MED ORDER — CLOPIDOGREL BISULFATE 75 MG PO TABS
75.0000 mg | ORAL_TABLET | Freq: Every day | ORAL | Status: DC
Start: 2022-02-16 — End: 2022-02-17
  Administered 2022-02-16 – 2022-02-17 (×2): 75 mg via ORAL
  Filled 2022-02-15 (×2): qty 1

## 2022-02-15 MED ORDER — FENTANYL CITRATE (PF) 100 MCG/2ML IJ SOLN
INTRAMUSCULAR | Status: AC
  Filled 2022-02-15: qty 2

## 2022-02-15 MED ORDER — HEPARIN (PORCINE) 25000 UT/250ML-% IV SOLN: 1300.0000 [IU]/h | INTRAVENOUS | Status: AC

## 2022-02-15 MED ORDER — GELATIN ABSORBABLE 12-7 MM EX MISC
CUTANEOUS | Status: AC
  Filled 2022-02-15: qty 1

## 2022-02-15 NOTE — Consult Note (Signed)
Chief Complaint: Patient was seen in consultation today for liver lesion biopsy Chief Complaint  Patient presents with   Shortness of Breath   at the request of Int Med: Dr Shelda Jakes   Supervising Physician: Sandi Mariscal  Patient Status: [facility] - In-pt  History of Present Illness: [name] [name] is a 61 y.o. male   Hypoxemia; Rh arthritis; HTN; CAD - Recent STEMI 01/31/22--off Eliquis now Hep drip) Malignancy of unknown source Thora 8/19: no malig cells Elevated CEA and CA-19-9  Work up revealing Liver; pancreatic; esophageal masses 02/11/22 MRI: IMPRESSION: 1. Rim enhancing, likely necrotic mass or lymph node conglomerate centered about the gastroesophageal junction, measuring 5.0 x 3.9 cm. 2. Large, rim enhancing, likely necrotic liver mass centered in the anterior left lobe of the liver, measuring 7.8 x 7.2 cm. 3. Enlarged gastrohepatic ligament and retroperitoneal lymph nodes. 4. Constellation of findings is most consistent with primary esophageal malignancy and associated metastatic disease. 5. Diffuse inflammatory fat stranding and fluid about the pancreas and adjacent retroperitoneum, with multiple, lobulated, rim enhancing fluid signal lesions throughout the pancreatic head and neck, largest centrally measuring 3.6 x 2.8 cm. Findings are most consistent with acute pancreatitis complicated by acute pancreatic fluid collections, the character and fluid composition of these pancreatic lesions distinct from esophageal and liver masses. 6. Small bilateral pleural effusions and associated atelectasis or consolidation. 7. Anasarca.    Request for liver lesion biopsy Dr Pascal Lux has reviewed imaging and approves procedure Scheduled for today in IR (Hep drip turned off 1045 am)  Past Medical History:  Diagnosis Date   Anemia    Anemia 08/06/2012   Aortic valve mass 12/30/2015   Community acquired pneumonia 11/07/2011   DDD (degenerative disc disease), lumbosacral     and  grade 2 slip   GERD (gastroesophageal reflux disease)    Heart murmur    History of anemia    no current med.   Hypertension    states is borderline on medication; has been on med. x 5-6 yr.   Hypokalemia 05/30/2018   Impingement syndrome of shoulder region 10/2014   left   Insomnia 06/01/2015   Ischemic ulcer of toe of left foot (HCC)    Left shoulder pain 12/16/2015   Low back pain without sciatica 10/29/2009   Lupus ([place])    Peripheral vascular disease (HCC)    Pneumonia    RA (rheumatoid arthritis) ([place])    Rotator cuff tear 11/04/2014    Past Surgical History:  Procedure Laterality Date   ABDOMINAL AORTAGRAM  02/20/2019   ABDOMINAL AORTOGRAM W/LOWER EXTREMITY N/A 02/20/2019   Procedure: ABDOMINAL AORTOGRAM W/LOWER EXTREMITY;  Surgeon: Marty Heck, MD;  Location: [place] [facility];  Service: Cardiovascular;  Laterality: N/A;   ACHILLES TENDON SURGERY Bilateral    AMPUTATION Left 05/11/2020   Procedure: LEFT ABOVE KNEE AMPUTATION;  Surgeon: Angelia Mould, MD;  Location: [place];  Service: Vascular;  Laterality: Left;   ENDARTERECTOMY POPLITEAL Left 05/01/2020   Procedure: ENDARTERECTOMY POPLITEAL;  Surgeon: Waynetta Sandy, MD;  Location: [place];  Service: Vascular;  Laterality: Left;   FASCIOTOMY Left 05/01/2020   Procedure: FASCIOTOMY;  Surgeon: Waynetta Sandy, MD;  Location: [place];  Service: Vascular;  Laterality: Left;   FEMORAL-POPLITEAL BYPASS GRAFT Left 02/26/2019   Procedure: BYPASS GRAFT FEMORAL-POPLITEAL ARTERY LEFT LEG USING 68m PROPATEN GRAFT;  Surgeon: BSerafina Mitchell MD;  [place]  Service: Vascular;  Laterality: Left;   INTRAOPERATIVE ARTERIOGRAM Left 01/22/2020  Procedure: INTRA OPERATIVE ARTERIOGRAM with administration of thrombolyics in Left femoral to popliteal bypass.;  Surgeon: Marty Heck, MD;  Location: [place];  Service: Vascular;  Laterality: Left;   LEFT HEART CATH AND CORONARY ANGIOGRAPHY N/A 03/15/2017    Procedure: LEFT HEART CATH AND CORONARY ANGIOGRAPHY;  Surgeon: Nelva Bush, MD;  Location: [place] [facility];  Service: Cardiovascular;  Laterality: N/A;   LEFT HEART CATH AND CORONARY ANGIOGRAPHY N/A 02/06/2022   Procedure: LEFT HEART CATH AND CORONARY ANGIOGRAPHY;  Surgeon: Martinique, Peter M, MD;  Location: [place] [facility];  Service: Cardiovascular;  Laterality: N/A;   LOWER EXTREMITY INTERVENTION Left 04/29/2020   Procedure: LOWER EXTREMITY INTERVENTION- LYSIS;  Surgeon: Marty Heck, MD;  Location: [place] [facility];  Service: Cardiovascular;  Laterality: Left;   OSTEOTOMY AND ULNAR SHORTENING Right 07/22/2002   PATCH ANGIOPLASTY Left 05/01/2020   Procedure: PATCH ANGIOPLASTY USING HEMASHIELD PLATINUM FINESSE PATCH;  Surgeon: Waynetta Sandy, MD;  Location: [place];  Service: Vascular;  Laterality: Left;   PERIPHERAL VASCULAR ATHERECTOMY  01/23/2020   Procedure: PERIPHERAL VASCULAR ATHERECTOMY;  Surgeon: Marty Heck, MD;  Location: [place] [facility];  Service: Cardiovascular;;   PERIPHERAL VASCULAR BALLOON ANGIOPLASTY  01/23/2020   Procedure: PERIPHERAL VASCULAR BALLOON ANGIOPLASTY;  Surgeon: Marty Heck, MD;  Location: [place] [facility];  Service: Cardiovascular;;   PERIPHERAL VASCULAR INTERVENTION Left 04/30/2020   Procedure: PERIPHERAL VASCULAR INTERVENTION;  Surgeon: Cherre Robins, MD;  Location: [place] [facility];  Service: Cardiovascular;  Laterality: Left;   PERIPHERAL VASCULAR THROMBECTOMY N/A 01/23/2020   Procedure: lysis recheck;  Surgeon: Marty Heck, MD;  Location: [place] [facility];  Service: Cardiovascular;  Laterality: N/A;  + [place]    PERIPHERAL VASCULAR THROMBECTOMY N/A 04/30/2020   Procedure: Lysis Recheck;  Surgeon: Cherre Robins, MD;  Location: [place] [facility];  Service: Cardiovascular;  Laterality: N/A;   SHOULDER ARTHROSCOPY WITH BICEPSTENOTOMY Right 03/11/2015   Procedure: SHOULDER ARTHROSCOPY WITH BICEPSTENOTOMY;   Surgeon: Ninetta Lights, MD;  Location: [place];  Service: Orthopedics;  Laterality: Right;   SHOULDER ARTHROSCOPY WITH DISTAL CLAVICLE RESECTION Left 11/19/2014   Procedure: SHOULDER ARTHROSCOPY WITH DISTAL CLAVICLE RESECTION;  Surgeon: Kathryne Hitch, MD;  Location: [place];  Service: Orthopedics;  Laterality: Left;   SHOULDER ARTHROSCOPY WITH ROTATOR CUFF REPAIR AND SUBACROMIAL DECOMPRESSION Left 11/19/2014   Procedure: LEFT SHOULDER ARTHROSCOPY, DEBRIDEMENT DISTAL CLAVICLE EXCISION, ACROMIOPLASTY WITH ROTATOR CUFF REPAIR ;  Surgeon: Kathryne Hitch, MD;  Location: [place];  Service: Orthopedics;  Laterality: Left;   THROMBECTOMY OF BYPASS GRAFT FEMORAL- POPLITEAL ARTERY Left 05/01/2020   Procedure: THROMBECTOMY OF LOWER EXTREMITY;  Surgeon: Waynetta Sandy, MD;  Location: [place];  Service: Vascular;  Laterality: Left;   TRANSFORAMINAL LUMBAR INTERBODY FUSION (TLIF) WITH PEDICLE SCREW FIXATION 1 LEVEL N/A 01/03/2018   Procedure: TRANSFORAMINAL LUMBAR INTERBODY FUSION (TLIF) LUMBAR FIVE-SACRAL ONE;  Surgeon: Melina Schools, MD;  Location: [place];  Service: Orthopedics;  Laterality: N/A;   ULTRASOUND GUIDANCE FOR VASCULAR ACCESS Right 01/22/2020   Procedure: ULTRASOUND GUIDANCE FOR VASCULAR ACCESS Right Common Femoral Artery.;  Surgeon: Marty Heck, MD;  Location: [place];  Service: Vascular;  Laterality: Right;   VIDEO ASSISTED THORACOSCOPY (VATS)/DECORTICATION Right 11/21/2011   drainage of empyema    Allergies: Dilaudid [hydromorphone hcl], Ramipril, and Tramadol  Medications: Prior to Admission medications   Medication Sig Start Date End Date Taking? Authorizing Provider  acetaminophen (TYLENOL)  325 MG tablet Take 650 mg by mouth every 6 (six) hours as needed for mild pain.   Yes [provider]  amLODipine (NORVASC) 10 MG tablet TAKE 1 TABLET BY MOUTH EVERY DAY Patient taking differently: Take 10 mg by mouth daily.  05/23/21  Yes Jose Persia, MD  APIXABAN [facility]ne [place]) VTE STARTER PACK ('10MG'$  AND '5MG'$ ) Take as directed on package: start with two-'5mg'$  tablets twice daily for 7 days. On day 8, switch to one-'5mg'$  tablet twice daily. 01/10/22  Yes Kathe Becton R, PA-C  atorvastatin (LIPITOR) 80 MG tablet Take 1 tablet (80 mg total) by mouth daily at 6 PM. 11/06/19  Yes Oda Kilts, MD  chlorthalidone (HYGROTON) 25 MG tablet TAKE 1 TABLET BY MOUTH EVERY DAY Patient taking differently: Take 25 mg by mouth daily. 09/27/21  Yes Demaio, Alexa, MD  clopidogrel (PLAVIX) 75 MG tablet Take 1 tablet (75 mg total) by mouth daily. 07/18/21 07/18/22 Yes Delene Ruffini, MD  diclofenac Sodium (VOLTAREN) 1 % GEL Apply 2-4 g topically in the morning, at noon, in the evening, and at bedtime. 12/26/21  Yes [provider]  docusate sodium (COLACE) 100 MG capsule Take 1 capsule (100 mg total) by mouth daily. 01/27/21  Yes Madalyn Rob, MD  DULoxetine (CYMBALTA) 30 MG capsule Take 30 mg by mouth daily. 12/26/21  Yes [provider]  furosemide (LASIX) 40 MG tablet Take 1 tablet (40 mg total) by mouth every other day for 3 doses. 02/08/22 02/13/22 Yes Florencia Reasons, MD  gabapentin (NEURONTIN) 100 MG capsule Take 100 mg by mouth at bedtime as needed (for pain).  12/31/19  Yes [provider]  gabapentin (NEURONTIN) 300 MG capsule Take 300 mg by mouth 2 (two) times daily. 08/02/21  Yes [provider]  lidocaine (LIDODERM) 5 % Place 1 patch onto the skin daily. Remove & Discard patch within 12 hours or as directed by MD For chest wall pain 02/09/22  Yes Florencia Reasons, MD  metoprolol tartrate (LOPRESSOR) 25 MG tablet Take 0.5 tablets (12.5 mg total) by mouth 2 (two) times daily. 02/07/22  Yes Charlynne Cousins, MD  naloxone [facility]) 2 MG/2ML injection Inject 2 mg into the muscle once as needed (opiod overdose (call 911, inject intramuscularly in shoulder or thigh. Repeat every 3 minutes)).    Yes [provider]   naloxone (NARCAN) nasal spray 4 mg/0.1 mL Place 1 spray into the nose as needed. 12/26/21  Yes [provider]  nicotine (NICODERM CQ - DOSED IN MG/24 HOURS) 21 mg/24hr patch Place 21 mg onto the skin daily. Leave on for 7 days.   Yes [provider]  oxyCODONE-acetaminophen (PERCOCET) 10-325 MG tablet Take 1 tablet by mouth every 6 (six) hours as needed for severe pain. 01/23/22  Yes [provider]  varenicline (CHANTIX) 1 MG tablet TAKE 1 TABLET BY MOUTH TWICE A DAY Patient taking differently: Take 1 mg by mouth 2 (two) times daily. 04/05/21  Yes Virl Axe, MD     Family History  Problem Relation Age of Onset   Heart attack Father 51   Alcohol abuse Father    Aneurysm Mother    Stroke Neg Hx    Cancer Neg Hx     Social History   Socioeconomic History   Marital status: Single    Spouse name: Not on file   Number of children: 0   Years of education: Not on file   Highest education level: Not on file  Occupational History  Occupation: Disabled    Comment: 2/2 RA  Tobacco Use   Smoking status: Former    Packs/day: 0.25    Years: 20.00    Total pack years: 5.00    Types: Cigarettes    Quit date: 02/19/2019    Years since quitting: 2.9   Smokeless tobacco: Never   Tobacco comments:    Patient stated he smokes a couple day and is attempting to quit smoking  Vaping Use   Vaping Use: Never used  Substance and Sexual Activity   Alcohol use: No    Alcohol/week: 0.0 standard drinks of alcohol    Comment: sober 1998   Drug use: No   Sexual activity: Not on file  Other Topics Concern   Not on file  Social History Narrative   On disability since 1992, used to work with city of [place]. Quit drinking in 1990.      Current Social History 11/05/2019        Patient lives by himself most of the time. Sometimes girlfriend's son stays with him in a one level home. There are 3 steps with handrail up to the entrance the patient uses.       Patient's  method of transportation is personal truck. This was recently vandalized and patient doesn't have funds to repair at this time.      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, BSN, RN-BC    Social Determinants of Health   Financial Resource Strain: Not on file  Food Insecurity: No Food Insecurity (01/27/2022)   Hunger Vital Sign    Worried About Running Out of Food in the Last Year: Never true    [place] in the Last Year: Never true  Transportation Needs: No Transportation Needs (01/27/2022)   PRAPARE - Hydrologist (Medical): No    Lack of Transportation (Non-Medical): No  Physical Activity: Not on file  Stress: Not on file  Social Connections: Not on file    Review of Systems: A 12 point ROS discussed and pertinent positives are indicated in the HPI above.  All other systems are negative.  Review of Systems  Constitutional:  Positive for activity change, appetite change and fatigue. Negative for fever.  Respiratory:  Positive for shortness of breath. Negative for cough.   Cardiovascular:  Negative for chest pain.  Gastrointestinal:  Negative for abdominal distention.  Musculoskeletal:  Negative for back pain.  Neurological:  Positive for weakness.  Psychiatric/Behavioral:  Negative for behavioral problems and confusion.     Vital Signs: BP 120/86 (BP Location: Right Arm)   Pulse 86   Temp 97.7 F (36.5 C) (Oral)   Resp 14   Ht '5\' 7"'$  (1.702 m)   Wt 175 lb 4.3 oz (79.5 kg)   SpO2 96%   BMI 27.45 kg/m     Physical  Exam Vitals reviewed.  HENT:  Mouth/Throat:     Mouth: Mucous membranes are moist.  Cardiovascular:     Rate and Rhythm: Normal rate and regular rhythm.  Pulmonary:     Effort: Pulmonary effort is normal.     Breath sounds: Normal breath sounds. No wheezing.  Musculoskeletal:        General: Normal range of motion.  Skin:    General: Skin is warm.  Neurological:     Mental Status: He is alert and oriented to person, place, and time.  Psychiatric:        Behavior: Behavior normal.     Imaging: US THORACENTESIS ASP PLEURAL SPACE W/IMG GUIDE  Result Date: 02/12/2022 INDICATION: Left pleural effusion EXAM: ULTRASOUND GUIDED Left THORACENTESIS MEDICATIONS: 10 cc 1% lidocaine. COMPLICATIONS: None immediate. PROCEDURE: An ultrasound guided thoracentesis was thoroughly discussed with the patient and questions answered. The benefits, risks, alternatives and complications were also discussed. The patient understands and wishes to proceed with the procedure. Written consent was obtained. Ultrasound was performed to localize and mark an adequate pocket of fluid in the left chest. The area was then prepped and draped in the normal sterile fashion. 1% Lidocaine was used for local anesthesia. Under ultrasound guidance a Yueh catheter was introduced. Thoracentesis was performed. The catheter was removed and a dressing applied. FINDINGS: A total of approximately 300 cc of blood tinged fluid was removed. Samples were sent to the laboratory as requested by the clinical team. IMPRESSION: Successful ultrasound guided left thoracentesis yielding 300 cc of pleural fluid. CXR: No PTX Read by Lavonia Drafts [facility] Electronically Signed   By: Corrie Mckusick D.O.   On: 02/12/2022 08:35   MR ABDOMEN W WO CONTRAST  Result Date: 02/11/2022 CLINICAL DATA:  Liver, pancreatic, and esophageal masses identified by CT EXAM: MRI ABDOMEN WITHOUT AND WITH CONTRAST TECHNIQUE: Multiplanar multisequence MR imaging of the abdomen  was performed both before and after the administration of intravenous contrast. CONTRAST:  44m GADAVIST GADOBUTROL 1 MMOL/ML IV SOLN COMPARISON:  Same-day CT abdomen pelvis FINDINGS: Lower chest: Cardiomegaly. Small bilateral pleural effusions and associated atelectasis or consolidation. Hepatobiliary: Hepatomegaly, maximum coronal span 24.4 cm. Probable hepatic steatosis, however due to technical error only in phase images are submitted for review. Large, rim enhancing, internally hypoenhancing liver mass centered in the anterior left lobe of the liver, hepatic segments IVA and IVB, measuring 7.8 x 7.2 cm (series 13, image 31). No gallstones, gallbladder wall thickening, or biliary dilatation. Pancreas: Diffuse inflammatory fat stranding and fluid about the pancreas and adjacent retroperitoneum. Multiple, lobulated, rim enhancing fluid signal lesions throughout the pancreatic head and neck, largest centrally measuring 3.6 x 2.8 cm (series 6, image 27). Pancreatic ductal dilatation. Spleen: Splenomegaly, maximum coronal span 14.5 cm. Adrenals/Urinary Tract: Adrenal glands are unremarkable. Kidneys are normal, without renal calculi, solid lesion, or hydronephrosis. Stomach/Bowel: Rim enhancing, internally hypoenhancing mass or lymph node conglomerate centered about the gastroesophageal junction, measuring 5.0 x 3.9 cm (series 13, image 29). No evidence of bowel wall thickening, distention, or inflammatory changes. Vascular/Lymphatic: Aortic atherosclerosis. Rim enhancing, internally hypoenhancing gastrohepatic ligament lymph node measuring up to 2.7 x 2.5 cm (series 12, image 40). Enlarged heterogeneously enhancing retroperitoneal lymph nodes measuring up to 2.3 x 1.6 cm (series 12, image 74). Other: No abdominal wall hernia. Anasarca small perihepatic and perisplenic ascites. Musculoskeletal: No acute or significant osseous findings. IMPRESSION: 1. Rim enhancing, likely necrotic mass or lymph node conglomerate  centered about the gastroesophageal junction, measuring 5.0 x 3.9 cm. 2. Large, rim  enhancing, likely necrotic liver mass centered in the anterior left lobe of the liver, measuring 7.8 x 7.2 cm. 3. Enlarged gastrohepatic ligament and retroperitoneal lymph nodes. 4. Constellation of findings is most consistent with primary esophageal malignancy and associated metastatic disease. 5. Diffuse inflammatory fat stranding and fluid about the pancreas and adjacent retroperitoneum, with multiple, lobulated, rim enhancing fluid signal lesions throughout the pancreatic head and neck, largest centrally measuring 3.6 x 2.8 cm. Findings are most consistent with acute pancreatitis complicated by acute pancreatic fluid collections, the character and fluid composition of these pancreatic lesions distinct from esophageal and liver masses. 6. Small bilateral pleural effusions and associated atelectasis or consolidation. 7. Anasarca. Electronically Signed   By: Delanna Ahmadi M.D.   On: 02/11/2022 19:29   DG Chest 1 View  Result Date: 02/11/2022 CLINICAL DATA:  Status post thoracentesis EXAM: CHEST  1 VIEW COMPARISON:  02/11/2022 FINDINGS: Trace left pleural effusion. No focal consolidation. No pneumothorax. No right pleural effusion. Stable cardiomegaly. No acute osseous abnormality. IMPRESSION: 1. Trace left pleural effusion. No pneumothorax. Electronically Signed   By: Kathreen Devoid M.D.   On: 02/11/2022 10:33   CT ABDOMEN PELVIS W CONTRAST  Result Date: 02/11/2022 CLINICAL DATA:  Liver mass on CT chest. Evaluate for metastatic disease. * Tracking Code: BO * EXAM: CT ABDOMEN AND PELVIS WITH CONTRAST TECHNIQUE: Multidetector CT imaging of the abdomen and pelvis was performed using the standard protocol following bolus administration of intravenous contrast. RADIATION DOSE REDUCTION: This exam was performed according to the departmental dose-optimization program which includes automated exposure control, adjustment of the mA  and/or kV according to patient size and/or use of iterative reconstruction technique. CONTRAST:  92m OMNIPAQUE IOHEXOL 300 MG/ML  SOLN COMPARISON:  Chest CTA earlier same day FINDINGS: Lower chest: Bibasilar collapse/consolidation with small pleural effusions, left greater than right. Hepatobiliary: 8.7 x 7.9 x 7.4 cm ill-defined irregular mass is identified in the medial segment left liver. This appears to be isolated with no other suspicious mass lesion evident within the hepatic parenchyma. Gallbladder is nondistended with apparent trace amount of gas in the gallbladder lumen (see axial 35/3). No substantial intrahepatic biliary duct dilatation with extrahepatic common bile duct not well seen in the head of the pancreas but measuring approximately 6 mm diameter just proximal to the ampulla. Pancreas: There is diffuse peripancreatic edema with ill-defined parenchyma in the head of the pancreas including a 3.0 x 2.9 cm hypoenhancing ill-defined focal lesion visible on 41/3. No substantial main duct dilatation evident. Spleen: No splenomegaly. No focal mass lesion. Adrenals/Urinary Tract: No adrenal nodule or mass. Kidneys unremarkable. No evidence for hydroureter. The urinary bladder appears normal for the degree of distention. Stomach/Bowel: Stomach is unremarkable. No gastric wall thickening. No evidence of outlet obstruction. Duodenum is normally positioned as is the ligament of Treitz. No small bowel wall thickening. No small bowel dilatation. The terminal ileum is normal. The appendix is normal. Diverticuli are seen scattered along the entire length of the colon without CT findings of diverticulitis. Vascular/Lymphatic: There is moderate atherosclerotic calcification of the abdominal aorta without aneurysm. 2.4 cm necrotic lymph node identified in the gastrohepatic ligament on image 30/3. 4.5 x 3.4 cm necrotic mass lesion is identified just cranial to the esophagogastric junction on image 22/3. 16 mm short  axis portal caval lymph node is seen on 30/7/3. Calcified para-aortic nodal tissue is seen in the retroperitoneal space. Mild external iliac lymphadenopathy seen bilaterally (right on 75/3 and left on 76/3) nodular irregular peritoneal  thickening noted in the lower abdomen and pelvis (see left pelvis on 73/3). Reproductive: The prostate gland and seminal vesicles are unremarkable. Other: Trace free fluid is seen in the pelvis. Small volume free fluid seen adjacent to the liver and spleen tracking into the paracolic gutter bilaterally. Musculoskeletal: Minimal presacral edema evident. No worrisome lytic or sclerotic osseous abnormality. Status post L5-S1 fusion IMPRESSION: 1. 8.7 x 7.9 x 7.4 cm ill-defined irregular mass in the medial segment left liver. Given findings below, metastatic disease is favored although primary hepatic neoplasm is not entirely excluded. 2. Diffuse peripancreatic edema with ill-defined parenchyma in the head of the pancreas including a 3.0 x 2.9 cm hypoenhancing ill-defined focal pancreatic head lesion. Imaging features are considered suspicious for primary pancreatic adenocarcinoma. MRI abdomen with and without contrast may prove helpful to further evaluate. Endoscopic ultrasound likely prove helpful. 3. 4.5 x 3.4 cm necrotic mass lesion is identified just cranial to the esophagogastric junction with additional metastatic lymphadenopathy in the abdomen and pelvis. 4. Irregular nodular peritoneal thickening in the lower abdomen and pelvis, concerning for metastatic disease. 5. Bibasilar collapse/consolidation with small pleural effusions, left greater than right. 6. Trace amount of gas in the gallbladder lumen, presumably related to prior sphincterotomy. In the absence of prior sphincterotomy, ascending biliary infection would be a consideration. 7. Aortic Atherosclerosis (ICD10-I70.0). Electronically Signed   By: Misty Stanley M.D.   On: 02/11/2022 07:52   CT Angio Chest Pulmonary  Embolism (PE) W or WO Contrast  Result Date: 02/11/2022 CLINICAL DATA:  Recent PE. Hypoxia this morning. Clinical concern for recurrent pulmonary embolus. EXAM: CT ANGIOGRAPHY CHEST WITH CONTRAST TECHNIQUE: Multidetector CT imaging of the chest was performed using the standard protocol during bolus administration of intravenous contrast. Multiplanar CT image reconstructions and MIPs were obtained to evaluate the vascular anatomy. RADIATION DOSE REDUCTION: This exam was performed according to the departmental dose-optimization program which includes automated exposure control, adjustment of the mA and/or kV according to patient size and/or use of iterative reconstruction technique. CONTRAST:  130m OMNIPAQUE IOHEXOL 350 MG/ML SOLN COMPARISON:  01/31/2022 FINDINGS: Cardiovascular: Heart is enlarged. Mild atherosclerotic calcification is noted in the wall of the thoracic aorta. Left lower lobe segmental pulmonary embolus is stable in appearance (see image 155/series 7. The tiny segmental pulmonary embolus of the right upper lobe is also not appreciably changed in the interval (142/7). No new filling defect within the opacified pulmonary arteries to suggest the presence of additional acute pulmonary embolus on today's study. Mediastinum/Nodes: No mediastinal lymphadenopathy. There is no hilar lymphadenopathy. The esophagus has normal imaging features. There is no axillary lymphadenopathy. Lungs/Pleura: Patchy ground-glass opacities with superimposed nodular component seen previously in both upper lobes have resolved in the interval. There is a persistent 5 mm cavitary nodule in the anterior left upper lobe (76/6) bibasilar collapse/consolidation noted left greater than right. Small to moderate left and small right pleural effusions are progressive in the interval. Upper Abdomen: The liver shows diffusely decreased attenuation suggesting fat deposition. Subtle nodularity of liver contour raises the question of  cirrhosis. 7.8 x 8.5 cm subtle hypoattenuating mass lesion is identified in segment IV, similar to prior. Small volume free fluid is seen adjacent to the liver and spleen. Musculoskeletal: No worrisome lytic or sclerotic osseous abnormality. Multiple bilateral anterior rib fractures again noted in this patient with a reported history of recent CPR. Review of the MIP images confirms the above findings. IMPRESSION: 1. Similar appearance nonocclusive segmental and subsegmental pulmonary embolus to the left  lower and right upper lobes. No new acute pulmonary embolus. 2. Interval resolution of the bilateral ground-glass opacities with superimposed nodular component seen previously in both upper lobes. 3. Persistent 5 mm cavitary nodule in the anterior left upper lobe. Follow-up CT chest in 3 months recommended to ensure stability. 4. Bibasilar collapse/consolidation left greater than right. 5. Small to moderate left and small right pleural effusions are progressive in the interval. 6. 7.8 x 8.5 cm subtle hypoattenuating mass lesion in segment IV of the liver, similar to prior but new since 06/26/2020. This could be metastatic disease or a primary liver mass. Consider dedicated abdomen/pelvis CT with oral and intravenous contrast to further evaluate. 7. Multiple bilateral anterior rib fractures again noted in this patient with a reported history of recent CPR. 8. Small volume free fluid adjacent to the liver and spleen. 9. Hepatic steatosis. Subtle nodularity of liver contour raises the question of cirrhosis. 10. Aortic Atherosclerosis (ICD10-I70.0). Critical Value/emergent results were called by telephone at the time of interpretation on 02/11/2022 at 5:34 am to provider [facility] , who verbally acknowledged these results. Electronically Signed   By: Misty Stanley M.D.   On: 02/11/2022 05:35   DG Chest Port 1 View  Result Date: 02/11/2022 CLINICAL DATA:  Shortness of breath. EXAM: PORTABLE CHEST 1 VIEW COMPARISON:   February 01, 2022 FINDINGS: The endotracheal tube, nasogastric tube and left-sided venous catheter seen on the prior study have been removed. The cardiac silhouette is enlarged and predominant stable, given differences in technique. There is marked severity calcification of the aortic arch. Marked severity atelectasis and/or infiltrate is seen within the retrocardiac region of the left lung base. A small left pleural effusion is also noted. No pneumothorax is identified. The visualized skeletal structures are unremarkable. IMPRESSION: 1. Marked severity left basilar atelectasis and/or infiltrate. 2. Small left pleural effusion. Electronically Signed   By: Virgina Norfolk M.D.   On: 02/11/2022 02:39   CARDIAC CATHETERIZATION  Result Date: 02/06/2022   Dist Cx lesion is 30% stenosed.   Prox RCA lesion is 100% stenosed.   LV end diastolic pressure is normal. Single vessel occlusive CAD. Occlusion of proximal nondominant RCA which is new since 2018. Normal LVEDP Plan: medical therapy   ECHOCARDIOGRAM LIMITED  Result Date: 02/01/2022    ECHOCARDIOGRAM LIMITED REPORT   Patient Name:   ORVELL CAREAGA Date of Exam: 02/01/2022 Medical Rec #:  [id number]     Height:       68.0 in Accession #:    [id number]    Weight:       162.7 lb Date of Birth:  05-09-1961    BSA:          1.872 m Patient Age:    54 years      BP:           110/87 mmHg Patient Gender: M             HR:           110 bpm. Exam Location:  Inpatient Procedure: Limited Echo, Cardiac Doppler, Color Doppler and Intracardiac            Opacification Agent Indications:    Cardiac arrest  History:        Patient has prior history of Echocardiogram examinations, most                 recent 01/31/2022. PAD; Risk Factors:Hypertension and Current  Smoker.  Sonographer:    Maudry Mayhew MHA, RDMS, RVT, RDCS Referring Phys: [id number] Candee Furbish  Sonographer Comments: Image acquisition challenging due to respiratory motion. IMPRESSIONS  1. Left  ventricular ejection fraction, by estimation, is 60 to 65%. The left ventricle has normal function. Left ventricular diastolic parameters are indeterminate. There is the interventricular septum is flattened in systole, consistent with right ventricular pressure overload.  2. The Right ventricle is severly dilated with hypokinesis in the basal and mid portion but hyperkinesis in the apical portion - consistent with McConnell's sign which was also present in the TTE performed 01/31/22. Right ventricular systolic function is moderately reduced. The right ventricular size is severely enlarged. There is normal pulmonary artery systolic pressure.  3. The mitral valve is grossly normal. No evidence of mitral valve regurgitation. No evidence of mitral stenosis.  4. The aortic valve is grossly normal. Aortic valve regurgitation is trivial. Aortic valve sclerosis is present, with no evidence of aortic valve stenosis.  5. The inferior vena cava is normal in size with greater than 50% respiratory variability, suggesting right atrial pressure of 3 mmHg. FINDINGS  Left Ventricle: Left ventricular ejection fraction, by estimation, is 60 to 65%. The left ventricle has normal function. Definity contrast agent was given IV to delineate the left ventricular endocardial borders. The left ventricular internal cavity size was small. The interventricular septum is flattened in systole, consistent with right ventricular pressure overload. Left ventricular diastolic parameters are indeterminate.  LV Wall Scoring: The apical lateral segment, apical anterior segment, apical inferior segment, and apex are akinetic. The anterior wall, antero-lateral wall, mid and distal anterior septum, inferior septum, inferior wall, and mid inferolateral segment are normal. Right Ventricle: The Right ventricle is severly dilated with hypokinesis in the basal and mid portion but hyperkinesis in the apical portion - consistent with McConnell's sign which was also  present in the TTE performed 01/31/22. The right ventricular size  is severely enlarged. Right ventricular systolic function is moderately reduced. There is normal pulmonary artery systolic pressure. The tricuspid regurgitant velocity is 2.43 m/s, and with an assumed right atrial pressure of 3 mmHg, the estimated right  ventricular systolic pressure is 32.3 mmHg. Left Atrium: Left atrial size was not assessed. Right Atrium: Right atrial size was not assessed. Pericardium: Presence of epicardial fat layer. Mitral Valve: The mitral valve is grossly normal. No evidence of mitral valve stenosis. Tricuspid Valve: The tricuspid valve is grossly normal. Tricuspid valve regurgitation is not demonstrated. No evidence of tricuspid stenosis. Aortic Valve: The aortic valve is grossly normal. Aortic valve regurgitation is trivial. Aortic valve sclerosis is present, with no evidence of aortic valve stenosis. Pulmonic Valve: The pulmonic valve was not well visualized. Aorta: The aortic root is normal in size and structure and the ascending aorta was not well visualized. Venous: The inferior vena cava is normal in size with greater than 50% respiratory variability, suggesting right atrial pressure of 3 mmHg. LEFT VENTRICLE PLAX 2D LVIDd:         4.70 cm Diastology LVIDs:         3.00 cm LV e' medial:    12.20 cm/s LV PW:         1.10 cm LV E/e' medial:  4.0 LV IVS:        0.90 cm LV e' lateral:   10.70 cm/s                        LV  E/e' lateral: 4.6  RIGHT VENTRICLE RV S prime:     9.14 cm/s LEFT ATRIUM         Index LA diam:    3.00 cm 1.60 cm/m   AORTA Ao Root diam: 3.60 cm MITRAL VALVE               TRICUSPID VALVE MV Area (PHT): 3.60 cm    TR Peak grad:   23.6 mmHg MV Decel Time: 211 msec    TR Vmax:        243.00 cm/s MV E velocity: 49.30 cm/s MV A velocity: 71.30 cm/s MV E/A ratio:  0.69 Kardie Tobb DO Electronically signed by Berniece Salines DO Signature Date/Time: 02/01/2022/11:59:14 AM    Final    EEG adult  Result Date:  02/01/2022 Lora Havens, MD     02/01/2022  8:34 AM Patient Name: NIKIA MANGINO MRN: [id number] Epilepsy Attending: Lora Havens Referring Physician/Provider: Erick Colace, NP Date: 01/31/2022 Duration: 30.34 mins Patient history: 61 year old male status postcardiac arrest.  EEG to evaluate for seizure. Level of alertness: comatose AEDs during EEG study: Propofol Technical aspects: This EEG study was done with scalp electrodes positioned according to the 10-20 International system of electrode placement. Electrical activity was reviewed with band pass filter of 1-'70Hz'$ , sensitivity of 7 uV/mm, display speed of 38m/sec with a '60Hz'$  notched filter applied as appropriate. EEG data were recorded continuously and digitally stored.  Video monitoring was available and reviewed as appropriate. Description: EEG showed continuous generalized 2 to 3 Hz With overriding drop to 40 Hz beta activity.  Intermittent episodes of subtle head jerks were noted without concomitant EEG change.  Hyperventilation and photic stimulation were not performed.   ABNORMALITY - Continuous slow, generalized IMPRESSION: This study is suggestive of severe diffuse encephalopathy, nonspecific etiology.  Intermittent episodes of head jerk were noted without concomitant EEG change or most likely not epileptic.  No seizures or epileptiform discharges were seen throughout the recording. PLora Havens  DG CHEST PORT 1 VIEW  Result Date: 02/01/2022 CLINICAL DATA:  61year old male central line placement. Pulmonary emboli. EXAM: PORTABLE CHEST 1 VIEW COMPARISON:  Portable chest 01/31/2022 and earlier. FINDINGS: Portable AP semi upright view at 0622 hours. Endotracheal tube and visible enteric tube appears stable. New left IJ approach central line in place. Catheter tip at or just below the carina (SVC level. No pneumothorax. Mediastinal contours are stable and within normal limits. Allowing for portable technique the lungs are now clear, and  bilateral ventilation appears substantially improved from the chest CTA yesterday. IMPRESSION: 1. New left IJ approach central line with tip at the SVC level, no adverse features. 2. Otherwise stable lines and tubes. 3. Improved bilateral ventilation since the CTA yesterday. No new pulmonary abnormality. Electronically Signed   By: HGenevie AnnM.D.   On: 02/01/2022 06:33   [place] SITE RITE  Result Date: 01/31/2022 If Site Rite image not attached, placement could not be confirmed due to current cardiac rhythm.  DG CHEST PORT 1 VIEW  Result Date: 01/31/2022 CLINICAL DATA:  Pulmonary embolus, intubated EXAM: PORTABLE CHEST 1 VIEW COMPARISON:  823 FINDINGS: Single frontal view of the chest demonstrates endotracheal tube overlying tracheal air column tip well above carina. Enteric catheter passes below diaphragm tip excluded by collimation. The cardiac silhouette is stable. Improved aeration of the left lung since prior study. No focal consolidation, effusion, or pneumothorax. No acute bony abnormality. IMPRESSION: 1. Support devices as above. 2.  Improved aeration of the lungs, with resolution of the left-sided airspace disease seen previously. Electronically Signed   By: Randa Ngo M.D.   On: 01/31/2022 22:13   ECHOCARDIOGRAM COMPLETE  Result Date: 01/31/2022    ECHOCARDIOGRAM REPORT   Patient Name:   RUDY LUHMANN Date of Exam: 01/31/2022 Medical Rec #:  [id number]     Height:       68.0 in Accession #:    [id number]    Weight:       178.0 lb Date of Birth:  May 17, 1961    BSA:          1.945 m Patient Age:    8 years      BP:           130/94 mmHg Patient Gender: M             HR:           100 bpm. Exam Location:  Inpatient Procedure: 2D Echo, Cardiac Doppler and Color Doppler Indications:    Abnormal ECG R94.31  History:        Patient has prior history of Echocardiogram examinations, most                 recent 02/22/2019. PVD, AV Mass, Signs/Symptoms:Murmur; Risk                 Factors:Hypertension. Lupus,  Anemia.  Sonographer:    Eartha Inch Referring Phys: [place] Comments: Echo performed with patient supine and on artificial respirator. Image acquisition challenging due to patient body habitus and Image acquisition challenging due to respiratory motion. IMPRESSIONS  1. Left ventricular ejection fraction, by estimation, is 60 to 65%. The left ventricle has normal function. The left ventricle has no regional wall motion abnormalities. Indeterminate diastolic filling due to E-A fusion. There is the interventricular septum is flattened in systole, consistent with right ventricular pressure overload.  2. Right ventricular systolic function is severely reduced. The right ventricular size is mildly enlarged. Tricuspid regurgitation signal is inadequate for assessing PA pressure.  3. The mitral valve is normal in structure. No evidence of mitral valve regurgitation.  4. The aortic valve was not well visualized. Aortic valve regurgitation is not visualized. Aortic valve sclerosis is present, with no evidence of aortic valve stenosis. Comparison(s): Prior images reviewed side by side. The right ventricular hypertrophy is significantly worse. Conclusion(s)/Recommendation(s): Discussed findings with primary team. FINDINGS  Left Ventricle: Left ventricular ejection fraction, by estimation, is 60 to 65%. The left ventricle has normal function. The left ventricle has no regional wall motion abnormalities. The left ventricular internal cavity size was normal in size. There is  no left ventricular hypertrophy. The interventricular septum is flattened in systole, consistent with right ventricular pressure overload. Indeterminate diastolic filling due to E-A fusion. Right Ventricle: McConnell's sign is present, strongly correlated with acute cor pulmonale, most likely large pulmonary embolism. The right ventricular size is mildly enlarged. No increase in right ventricular wall thickness. Right  ventricular systolic function is severely reduced. Tricuspid regurgitation signal is inadequate for assessing PA pressure. Left Atrium: Left atrial size was normal in size. Right Atrium: Right atrial size was normal in size. Pericardium: There is no evidence of pericardial effusion. Mitral Valve: The mitral valve is normal in structure. No evidence of mitral valve regurgitation. Tricuspid Valve: The tricuspid valve is normal in structure. Tricuspid valve regurgitation is not demonstrated. Aortic Valve: The aortic valve was not well visualized. Aortic  valve regurgitation is not visualized. Aortic valve sclerosis is present, with no evidence of aortic valve stenosis. Pulmonic Valve: The pulmonic valve was not well visualized. Aorta: The aortic root is normal in size and structure. Venous: IVC assessment for right atrial pressure unable to be performed due to mechanical ventilation. IAS/Shunts: No atrial level shunt detected by color flow Doppler. Sanda Klein MD Electronically signed by Sanda Klein MD Signature Date/Time: 01/31/2022/4:17:16 PM    Final    CT Angio Chest PE W and/or Wo Contrast  Result Date: 01/31/2022 CLINICAL DATA:  Pulmonary embolism (PE) suspected, high prob EXAM: CT ANGIOGRAPHY CHEST WITH CONTRAST TECHNIQUE: Multidetector CT imaging of the chest was performed using the standard protocol during bolus administration of intravenous contrast. Multiplanar CT image reconstructions and MIPs were obtained to evaluate the vascular anatomy. RADIATION DOSE REDUCTION: This exam was performed according to the departmental dose-optimization program which includes automated exposure control, adjustment of the mA and/or kV according to patient size and/or use of iterative reconstruction technique. CONTRAST:  148m OMNIPAQUE IOHEXOL 350 MG/ML SOLN COMPARISON:  CTA 06/26/2020. FINDINGS: Cardiovascular: Satisfactory opacification of the pulmonary arteries to the segmental level. There is no acute pulmonary  embolism at the branching of the left main pulmonary artery into the segmental lower lobe arteries (series 5, images 65-67, series 8, image [id number]). Additional small filling defect and a right upper lobe segmental artery (series 5, image 57). No central/saddle PE.Cardiomegaly. RV: LV ratio is 0.9. Trace pericardial effusion. Atherosclerosis of the thoracic aorta. Mediastinum/Nodes: No lymphadenopathy. The thyroid is unremarkable. Esophagus is unremarkable. Lungs/Pleura: There are confluent ground-glass opacities in the upper lungs bilaterally with areas of nodular consolidation and interlobular septal thickening. Bibasilar atelectasis. No pleural effusion. No pneumothorax. Mid to Upper Abdomen: No acute abnormality. Musculoskeletal: There are multiple acute anterior rib fractures, involving ribs 2 through 7 bilaterally. Most of these are angulated and nondisplaced. Fractures involving ribs 3 through 5 on the right are mildly displaced. Nondisplaced mid sternal fracture. Review of the MIP images confirms the above findings. IMPRESSION: Acute segmental pulmonary emboli in the left lower and right upper lobes. No definite CT evidence of right heart strain. Complex ground-glass opacities in the mid to upper lungs bilaterally with areas of nodular consolidation and interlobular septal thickening. Findings are most consistent with contusions related to recent cardiopulmonary resuscitation, versus a multifocal infectious/inflammatory process. Bibasilar atelectasis. Multiple anterior rib fractures involving ribs 2 through 7 bilaterally. Nondisplaced mid sternal fracture. Critical Value/emergent results were called by telephone at the time of interpretation on 01/31/2022 at 2:29 pm to provider SSherwood Gambler, who verbally acknowledged these results. Electronically Signed   By: JMaurine SimmeringM.D.   On: 01/31/2022 14:36   CT Head Wo Contrast  Result Date: 01/31/2022 CLINICAL DATA:  Neuro deficit, acute, stroke suspected EXAM:  CT HEAD WITHOUT CONTRAST TECHNIQUE: Contiguous axial images were obtained from the base of the skull through the vertex without intravenous contrast. RADIATION DOSE REDUCTION: This exam was performed according to the departmental dose-optimization program which includes automated exposure control, adjustment of the mA and/or kV according to patient size and/or use of iterative reconstruction technique. COMPARISON:  12/08/2015 FINDINGS: Brain: Late subacute to chronic appearing infarct within the inferior left occipital lobe. Otherwise, no evidence of acute infarction. No hemorrhage, hydrocephalus, extra-axial collection, or mass lesion. Scattered low-density changes within the periventricular and subcortical white matter compatible with chronic microvascular ischemic change. Vascular: Atherosclerotic calcifications involving the large vessels of the skull base. No unexpected hyperdense vessel.  Skull: Normal. Negative for fracture or focal lesion. Sinuses/Orbits: Small retention cyst or polyp in the right maxillary sinus. Other: None. IMPRESSION: 1. Late subacute to chronic appearing infarct within the inferior left occipital lobe. Follow-up MRI can be performed to more accurately characterize. 2. Otherwise, no acute intracranial abnormality. Electronically Signed   By: Davina Poke D.O.   On: 01/31/2022 14:25   DG Chest Port 1 View  Result Date: 01/31/2022 CLINICAL DATA:  Post CPR EXAM: PORTABLE CHEST 1 VIEW COMPARISON:  Chest x-ray 06/26/2020 FINDINGS: Endotracheal tube in good position. NG tube in the stomach with the tip at the GE junction. Atherosclerotic calcification aortic arch. Right lung is clear. Mild left upper lobe and left lower lobe airspace disease. No left effusion. No acute skeletal abnormality IMPRESSION: Endotracheal tube in good position. NG tip in this stomach with the side hole in the distal esophagus. Mild airspace disease left upper lobe and left lower lobe. Possible atelectasis or  aspiration. Electronically Signed   By: Franchot Gallo M.D.   On: 01/31/2022 12:50    Labs:  CBC: Recent Labs    02/12/22 0428 02/13/22 0922 02/14/22 0342 02/15/22 0400  WBC 11.7* 11.2* 10.8* 8.7  HGB 9.1* 10.3* 9.3* 10.0*  HCT 28.4* 33.4* 29.0* 31.7*  PLT 332 364 301 290    COAGS: Recent Labs    01/31/22 1340 01/31/22 2041 02/01/22 0414 02/01/22 1625 02/02/22 0101 02/11/22 1510 02/13/22 0922  INR 1.5*  --   --   --   --  1.5*  --   APTT 147*   < > 33 60* 77*  --  45*   < > = values in this interval not displayed.    BMP: Recent Labs    02/12/22 0428 02/13/22 0922 02/14/22 0342 02/15/22 0400  NA 135 135 136 133*  K 3.8 4.0 3.8 4.1  CL 102 98 102 100  CO2 '24 26 24 24  '$ GLUCOSE 154* 95 114* 137*  BUN '19 18 21 20  '$ CALCIUM 7.9* 8.6* 8.6* 9.0  CREATININE 1.08 1.11 1.30* 0.97  GFRNONAA >60 >60 >60 >60    LIVER FUNCTION TESTS: Recent Labs    01/31/22 1306 01/31/22 1843 02/01/22 0414 02/11/22 0151  BILITOT 0.3 0.5 0.5 0.5  AST 416* 598* 319* 29  ALT 406* 462* 297* 43  ALKPHOS 173* 228* 178* 195*  PROT 5.8* 6.9 6.1* 6.1*  ALBUMIN 2.6* 3.0* 2.8* 2.0*    TUMOR MARKERS: No results for input(s): "AFPTM", "CEA", "CA199", "CHROMGRNA" in the last 8760 hours.  Assessment and Plan:  Malignancy with unknown primary source Abnormal imaging revealing liver; pancreatic and esophageal masses Scheduled for liver lesion biopsy today in IR Hep drip off at 1045 am Risks and benefits of liver lesion biopsy was discussed with the patient and/or patient's family including, but not limited to bleeding, infection, damage to adjacent structures or low yield requiring additional tests.  All of the questions were answered and there is agreement to proceed. Consent signed and in chart.     Thank you for this interesting consult.  I greatly enjoyed meeting [name] [name] and look forward to participating in their care.  A copy of this report was sent to the requesting  provider on this date.  Electronically Signed: Lavonia Drafts, PA-C 02/15/2022, 10:48 AM   I spent a total of 20 Minutes    in face to face in clinical consultation, greater than 50% of which was counseling/coordinating care for liver lesion biopsy

## 2022-02-15 NOTE — Care Management Important Message (Signed)
Important Message  Patient Details  Name: ONAJE [name] MRN: [id number] Date of Birth: 05/31/1961   Medicare Important Message Given:  Yes     [name] 02/15/2022, 7:51 AM

## 2022-02-15 NOTE — Progress Notes (Addendum)
ANTICOAGULATION CONSULT NOTE  Pharmacy Consult for Heparin  Indication: Acute PE - 8/8  Allergies  Allergen Reactions   Dilaudid [Hydromorphone Hcl] Rash   Ramipril Rash and Other (See Comments)    Per patient on R arm and leg only; no angioedema   Tramadol Itching and Rash    Patient Measurements: Height: '5\' 7"'$  (170.2 cm) Weight: 79.5 kg (175 lb 4.3 oz) IBW/kg (Calculated) : 66.1 Heparin Dosing Weight: 83.4 kg  Vital Signs: Temp: 97.7 F (36.5 C) (08/23 1453) Temp Source: Oral (08/23 1453) BP: 120/80 (08/23 1440) Pulse Rate: 87 (08/23 1453)  Labs: Recent Labs    02/13/22 0922 02/13/22 1820 02/14/22 0342 02/15/22 0400  HGB 10.3*  --  9.3* 10.0*  HCT 33.4*  --  29.0* 31.7*  PLT 364  --  301 290  APTT 45*  --   --   --   HEPARINUNFRC 0.94* 0.68 0.53 0.22*  CREATININE 1.11  --  1.30* 0.97     Estimated Creatinine Clearance: 80.9 mL/min (by C-G formula based on SCr of 0.97 mg/dL).   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   Hypertension    states is borderline on medication; has been on med. x 5-6 yr.   Hypokalemia 05/30/2018   Impingement syndrome of shoulder region 10/2014   left   Insomnia 06/01/2015   Ischemic ulcer of toe of left foot (HCC)    Left shoulder pain 12/16/2015   Low back pain without sciatica 10/29/2009   Lupus ([place])    Peripheral vascular disease (HCC)    Pneumonia    RA (rheumatoid arthritis) ([place])    Rotator cuff tear 11/04/2014    Assessment: 61 yo male presented with SOB. Recent BLE DVT per venous duplex 01/11/20, started 01/10/22 on Eliquis (PTA LD 8/18 '@2100'$ ) treatment dose and Plavix for PAD. Recent acute PE 01/31/22, CT from this admission shows prior PE  unchanged. Patient received therapeutic lovenox 8/19-8/20 (last dose 8/20 at ~ 9am). Pharmacy consulted for IV heparin dosing until biopsy complete.  -Heparin level below goal: 0.22 on 1100 units/hr this morning. Patient is now s/p liver bx this afternoon. Ok per IR to resume heparin this evening. Also discussed oral anticoagulation plans with primary team and apixaban is to start tomorrow as long as no issues occur overnight.   Goal of Therapy:  Heparin level 0.3-0.7 Monitor platelets by anticoagulation protocol: Yes   Plan:  Restart heparin infusion at 1200 units/hr'@6pm'$   Heparin off tomorrow morning as we transition to apixaban  Erin Hearing PharmD., BCPS Clinical Pharmacist 02/15/2022 3:45 PM

## 2022-02-15 NOTE — Progress Notes (Signed)
PT Cancellation Note  Patient Details Name: [name] [name] MRN: [id number] DOB: 1961/03/28   Cancelled Treatment:    Reason Eval/Treat Not Completed: Active bedrest order remains until 1800 on 8/23. Will continue to follow and evaluate after bedrest as appropriate.   [place], PT, DPT   Acute Rehabilitation Department   Sandra Cockayne 02/15/2022, 4:07 PM

## 2022-02-15 NOTE — Progress Notes (Signed)
PT Cancellation Note  Patient Details Name: HUTTON PELLICANE MRN: [id number] DOB: 26-Feb-1961   Cancelled Treatment:    Reason Eval/Treat Not Completed: Patient at procedure or test/unavailable Will follow up as schedule allows.   Lou Miner, DPT  Acute Rehabilitation Services  Office: [phone]  Rudean Hitt 02/15/2022, 2:20 PM

## 2022-02-15 NOTE — Progress Notes (Signed)
[facility] for Heparin  Indication: Acute PE - 8/8  Allergies  Allergen Reactions   Dilaudid [Hydromorphone Hcl] Rash   Ramipril Rash and Other (See Comments)    Per patient on R arm and leg only; no angioedema   Tramadol Itching and Rash    Patient Measurements: Height: '5\' 7"'$  (170.2 cm) Weight: 79.5 kg (175 lb 4.3 oz) IBW/kg (Calculated) : 66.1 Heparin Dosing Weight: 83.4 kg  Vital Signs: Temp: 97.7 F (36.5 C) (08/23 0537) Temp Source: Oral (08/23 0537) BP: 112/87 (08/23 0537) Pulse Rate: 87 (08/23 0537)  Labs: Recent Labs    02/13/22 0922 02/13/22 1820 02/14/22 0342 02/15/22 0400  HGB 10.3*  --  9.3* 10.0*  HCT 33.4*  --  29.0* 31.7*  PLT 364  --  301 290  APTT 45*  --   --   --   HEPARINUNFRC 0.94* 0.68 0.53 0.22*  CREATININE 1.11  --  1.30* 0.97     Estimated Creatinine Clearance: 80.9 mL/min (by C-G formula based on SCr of 0.97 mg/dL).   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   Hypertension    states is borderline on medication; has been on med. x 5-6 yr.   Hypokalemia 05/30/2018   Impingement syndrome of shoulder region 10/2014   left   Insomnia 06/01/2015   Ischemic ulcer of toe of left foot (HCC)    Left shoulder pain 12/16/2015   Low back pain without sciatica 10/29/2009   Lupus ([place])    Peripheral vascular disease (HCC)    Pneumonia    RA (rheumatoid arthritis) ([place])    Rotator cuff tear 11/04/2014    Assessment: 61 yo male presented with SOB. Recent BLE DVT per venous duplex 01/11/20, started 01/10/22 on Eliquis (PTA LD 8/18 '@2100'$ ) treatment dose and Plavix for PAD. Recent acute PE 01/31/22, CT from this admission shows prior PE  unchanged. Patient received therapeutic lovenox 8/19-8/20 (last dose 8/20 at ~ 9am). Pharmacy consulted for IV heparin dosing until biopsy complete.  -Heparin level below goal: 0.22 on 1100 units/hr after previously being therapeutic; no infusion issues or disruptions overnight. Patient is to have a liver biopsy later today, but no specific time yet. CBC stable   Goal of Therapy:  Heparin level 0.3-0.7 Monitor platelets by anticoagulation protocol: Yes   Plan:  Increase heparin infusion to at 1300 units/hr  Monitor daily heparin level and CBC  Georga Bora, PharmD Clinical Pharmacist 02/15/2022 6:19 AM Please check AMION for all [place] numbers

## 2022-02-15 NOTE — Progress Notes (Signed)
NAME:  [name] [name], MRN:  [id number], DOB:  1961/04/03, LOS: 4 ADMISSION DATE:  02/11/2022  Subjective  Patient evaluated at bedside this AM with his friend [name] [name]. Continues to endorse feeling fine and wanting to proceed with diagnosing his unknown masses. He hasnt done much getting up and out of bed and says he has persistent rib pain causing him to feel acute dyspnea at times.  Objective   Blood pressure 120/80, pulse 87, temperature 97.7 F (36.5 C), temperature source Oral, resp. rate (!) 25, height '5\' 7"'$  (1.702 m), weight 79.5 kg, SpO2 94 %.     Intake/Output Summary (Last 24 hours) at 02/15/2022 1528 Last data filed at 02/15/2022 1327 Gross per 24 hour  Intake --  Output 2900 ml  Net -2900 ml    Filed Weights   02/12/22 0701 02/14/22 0343 02/15/22 0537  Weight: 83 kg 80.8 kg 79.5 kg   Physical Exam:grossly unchanged from prior days General: laying in bed, pleasant, no distress, chronically ill-appearing and flushed CV: regular rate and rhythm, no r/m/g, no JVD, pulses 2+ Pulm: Normal work of breathing on 2L supplemental oxygen. Interval improvement in crackles heard diffusely but worse at the bases, no wheezing, good air movement throughout Abdomen: Soft, non-tender, non-distended. Normoactive bowel sounds. Msk: left aka, no R LE, RLE warm and well perfused Skin: warm and dry Neuro: Awake, alert, conversing appropriately. Grossly non-focal  Labs       Latest Ref Rng & Units 02/15/2022    4:00 AM 02/14/2022    3:42 AM 02/13/2022    9:22 AM  CBC  WBC 4.0 - 10.5 K/uL 8.7  10.8  11.2   Hemoglobin 13.0 - 17.0 g/dL 10.0  9.3  10.3   Hematocrit 39.0 - 52.0 % 31.7  29.0  33.4   Platelets 150 - 400 K/uL 290  301  364       Latest Ref Rng & Units 02/15/2022    4:00 AM 02/14/2022    3:42 AM 02/13/2022    9:22 AM  BMP  Glucose 70 - 99 mg/dL 137  114  95   BUN 8 - 23 mg/dL '20  21  18   '$ Creatinine 0.61 - 1.24 mg/dL 0.97  1.30  1.11   Sodium 135 - 145 mmol/L 133  136  135    Potassium 3.5 - 5.1 mmol/L 4.1  3.8  4.0   Chloride 98 - 111 mmol/L 100  102  98   CO2 22 - 32 mmol/L '24  24  26   '$ Calcium 8.9 - 10.3 mg/dL 9.0  8.6  8.6    Summary   [name] [name] is 61yo person with recent inferior STEMI and PE/DVT, peripheral arterial disease s/p L AKA 2021, rheumatoid arthritis not on DMARD admitted 8/19 with acute hypoxic respiratory failure and found to have incidental finding of multiple intra-abdominal masses c/f primary esophageal malignancy with metastatic disease.  Assessment & Plan:  Principal Problem:   Acute respiratory failure with hypoxemia (HCC) Active Problems:   Rheumatoid arthritis (HCC)   HTN (hypertension)   Coronary artery disease involving native coronary artery of native heart with unstable angina pectoris (HCC)   PAD (peripheral artery disease) (HCC)   DVT (deep venous thrombosis) (HCC)   Pulmonary emboli (HCC)   Multiple fractures of ribs, left side, initial encounter for closed fracture   Acute respiratory failure with hypoxia (HCC)   Liver mass   Abnormal CT scan, gastrointestinal tract  Malignancy of unknown originExudative  L pleural effusion s/p thoracentesis DVT, PE There is high likely hood of malignancy given new GE junction and liver masses, retroperitoneal node enlargement and extremely elevated CEA and CA-19-9 tumor markers. Pleural effusion cytology was without malignant cells. Patient had liver biopsy today with IR given that GI did not think he was appropriate to receive anesthesia for EGD biopsy given STEMI 8/8. Will continue heparin ggt given high bleed risk post liver biopsy. Oncology waiting to follow until tissue diagnosis of malignancy but is aware of the patient. - Continue heparin gtt, resume eliquis and plavix tomorrow AM -IR consulted  Rib pain post CPR Patient has persistent rib pain that is causing persistent need for 2L [place], which he is not on at home. Will work on pain control and may need 02 at home. -Percocet q4  hours oral scheduled -oxy '5mg'$  oral q6hrs prn for breakthrough pain -PT/OT  History of recent inferior STEMI Coronary artery disease No chest pain and no ACS per admission trops and ekg.Holding Plavix in setting of upcoming biopsy. Hemodynamically stable. - Continue home atorvastatin, metoprolol - Holding home amlodipine, chlorthalidone in setting of normotension  Normocytic anemia Hgb stable with slight drop from 10.3 to 9.3 but no melena or hemoptysis or other signs of bleed. Iron studies yesterday revealed ferritin 151, Fe 13, sat 5%. Given normocytic anemia with low iron/saturation and normal ferritin, suspect early anemia of chronic disease in the setting of malignancy. Will continue to monitor.  Acute hypoxic respiratory failureCommunity-acquired pneumonia-resolved  Best practice:  DIET: HH IVF: n/a DVT PPX: heparin BOWEL: n/a CODE: FULL FAM COM: n/a  [name] Coach, MD Internal Medicine Resident PGY-1 PAGER: [phone] 02/15/2022 3:28 PM  If after hours (below), please contact on-call pager: [phone] 5PM-7AM Monday-Friday 1PM-7AM Saturday-Sunday

## 2022-02-15 NOTE — Procedures (Signed)
Pre Procedure Dx: Liver lesion Post Procedural Dx: Same  Technically successful US guided biopsy of indeterminate lesion within the medial segment of the left lobe of the liver.  EBL: None No immediate complications.   Ronny Bacon, MD Pager #: [phone]

## 2022-02-15 NOTE — Evaluation (Signed)
Occupational Therapy Evaluation Patient Details Name: [name] [name] MRN: [id number] DOB: 07-27-1960 Today's Date: 02/15/2022   History of Present Illness 61 year old male with past medical history of rheumatoid arthritis, PAD status post left AKA 2021, CAD, PE and DVT on DOAC, recent cardiac arrest from inferior STEMI who presented to the ED for shortness of breath.  Found to have incidental finding of multiple intra-abdominal masses c/f primary esophageal malignancy with metastatic disease.   Clinical Impression   Patient readmitted from a SNF for the above diagnosis.  Patient continues to present with the deficits listed below.  Evaluation held to bed level, subsequently understood bedrest until 1800 post IR procedure.  Patient needing Mod A to scoot higher in the bed in preparation for supper.  He currently wants to return home with assist as needed from friends that live with him.  Not sure he appreciates the level of assist he will need.  OT will continue efforts to advance him OOB, and will recommend SNF for continued rehab prior to returning home.  If the individuals that live with him are able to provide 24 hour Mod A care, home with [facility] is a possibility.       Recommendations for follow up therapy are one component of a multi-disciplinary discharge planning process, led by the attending physician.  Recommendations may be updated based on patient status, additional functional criteria and insurance authorization.   Follow Up Recommendations  Skilled nursing-short term rehab (<3 hours/day)    Assistance Recommended at Discharge Frequent or constant Supervision/Assistance  Patient can return home with the following A lot of help with bathing/dressing/bathroom;Assistance with cooking/housework;Assistance with feeding;Assist for transportation;A lot of help with walking and/or transfers;Help with stairs or ramp for entrance    Functional Status Assessment  Patient has had a recent decline in  their functional status and demonstrates the ability to make significant improvements in function in a reasonable and predictable amount of time.  Equipment Recommendations  None recommended by OT    Recommendations for Other Services       Precautions / Restrictions Precautions Precautions: Fall      Mobility Bed Mobility Overal bed mobility: Needs Assistance Bed Mobility: Mod A       Transfers    Deferred                      Balance   Sitting-balance support: No upper extremity supported, Feet supported Sitting balance-Leahy Scale: Fair.  Able to long sit in bed unsupported, but difficulty pulling himself forward                                     ADL either performed or assessed with clinical judgement   ADL   Eating/Feeding: Set up;Bed level   Grooming: Set up;Bed level           Upper Body Dressing : Set up; bedlevel   Lower Body Dressing: Maximal assistance; bed level                     Vision Patient Visual Report: No change from baseline       Perception Perception Perception: Within Functional Limits   Praxis Praxis Praxis: Intact    Pertinent Vitals/Pain Pain Assessment Pain Score: 5  Pain Location: chest and ribs Pain Descriptors / Indicators: Tender Pain Intervention(s): Monitored during session     Hand Dominance  Left   Extremity/Trunk Assessment Upper Extremity Assessment Upper Extremity Assessment: RUE deficits/detail;LUE deficits/detail RUE Deficits / Details: RA deformities to hand RUE Coordination: decreased fine motor LUE Deficits / Details: RA deformities to hand LUE Coordination: decreased fine motor   Lower Extremity Assessment Lower Extremity Assessment: Defer to PT evaluation LLE Deficits / Details: AKA   Cervical / Trunk Assessment Cervical / Trunk Assessment: Kyphotic   Communication Communication Communication: No difficulties   Cognition Arousal/Alertness:  Awake/alert Behavior During Therapy: WFL for tasks assessed/performed Overall Cognitive Status: No family/caregiver present to determine baseline cognitive functioning                           Safety/Judgement: Decreased awareness of safety, Decreased awareness of deficits           General Comments   VSS     Exercises     Shoulder Instructions      Home Living Family/patient expects to be discharged to:: Private residence Living Arrangements: Non-relatives/Friends Available Help at Discharge: Friend(s);Available 24 hours/day Type of Home: House Home Access: Ramped entrance     Home Layout: One level     Bathroom Shower/Tub: Teacher, early years/pre: Standard Bathroom Accessibility: Yes How Accessible: Accessible via wheelchair Home Equipment: Tub bench;Grab bars - tub/shower;Rolling Walker (2 wheels);Wheelchair - manual;BSC/3in1;Hand held shower head          Prior Functioning/Environment Prior Level of Function : Needs assist             Mobility Comments: pt performs scoot pivot, stand pivot with prosthesis or walking with RW and prosthesis ADLs Comments: friends assist with cooking and cleaning, states he was caring for his own ADLs prior to most recent hospitalization.        OT Problem List: Decreased strength;Decreased activity tolerance;Impaired balance (sitting and/or standing);Decreased safety awareness      OT Treatment/Interventions: Self-care/ADL training;Therapeutic exercise;Energy conservation;DME and/or AE instruction;Therapeutic activities;Patient/family education    OT Goals(Current goals can be found in the care plan section) Acute Rehab OT Goals Patient Stated Goal: Return home OT Goal Formulation: With patient Time For Goal Achievement: 03/01/22 Potential to Achieve Goals: Fair ADL Goals Pt Will Perform Grooming: sitting;with set-up Pt Will Perform Upper Body Dressing: with set-up;sitting Pt Will Perform Lower  Body Dressing: with min assist;sitting/lateral leans Pt Will Transfer to Toilet: with supervision;stand pivot transfer;bedside commode Pt Will Perform Toileting - Clothing Manipulation and hygiene: with set-up;sitting/lateral leans  OT Frequency: Min 2X/week    Co-evaluation              AM-PAC OT "6 Clicks" Daily Activity     Outcome Measure Help from another person eating meals?: A Little Help from another person taking care of personal grooming?: A Little Help from another person toileting, which includes using toliet, bedpan, or urinal?: A Lot Help from another person bathing (including washing, rinsing, drying)?: A Lot Help from another person to put on and taking off regular upper body clothing?: A Little Help from another person to put on and taking off regular lower body clothing?: A Lot 6 Click Score: 15   End of Session Nurse Communication: Mobility status  Activity Tolerance: Other (comment) (wanting to stay in bed for supper) Patient left: in bed;with call bell/phone within reach  OT Visit Diagnosis: Other abnormalities of gait and mobility (R26.89)                Time: 2505-3976 OT Time  Calculation (min): 12 min Charges:  OT General Charges $OT Visit: 1 Visit OT Evaluation $OT Eval Moderate Complexity: 1 Mod  02/15/2022  RP, OTR/L  Acute Rehabilitation Services  Office:  [place] 02/15/2022, 4:05 PM

## 2022-02-16 ENCOUNTER — Encounter (HOSPITAL_COMMUNITY): Payer: Self-pay | Admitting: Internal Medicine

## 2022-02-16 ENCOUNTER — Encounter: Payer: Self-pay | Admitting: *Deleted

## 2022-02-16 ENCOUNTER — Other Ambulatory Visit: Payer: Self-pay | Admitting: *Deleted

## 2022-02-16 DIAGNOSIS — J9601 Acute respiratory failure with hypoxia: Secondary | ICD-10-CM | POA: Diagnosis not present

## 2022-02-16 DIAGNOSIS — D509 Iron deficiency anemia, unspecified: Secondary | ICD-10-CM | POA: Diagnosis not present

## 2022-02-16 DIAGNOSIS — I2510 Atherosclerotic heart disease of native coronary artery without angina pectoris: Secondary | ICD-10-CM | POA: Diagnosis not present

## 2022-02-16 DIAGNOSIS — R0781 Pleurodynia: Secondary | ICD-10-CM | POA: Diagnosis not present

## 2022-02-16 DIAGNOSIS — I82409 Acute embolism and thrombosis of unspecified deep veins of unspecified lower extremity: Secondary | ICD-10-CM

## 2022-02-16 DIAGNOSIS — I2699 Other pulmonary embolism without acute cor pulmonale: Secondary | ICD-10-CM

## 2022-02-16 DIAGNOSIS — J910 Malignant pleural effusion: Secondary | ICD-10-CM

## 2022-02-16 LAB — CULTURE, BODY FLUID W GRAM STAIN -BOTTLE: Culture: NO GROWTH

## 2022-02-16 LAB — CBC
HCT: 31.8 % — ABNORMAL LOW (ref 39.0–52.0)
Hemoglobin: 10.3 g/dL — ABNORMAL LOW (ref 13.0–17.0)
MCH: 25.7 pg — ABNORMAL LOW (ref 26.0–34.0)
MCHC: 32.4 g/dL (ref 30.0–36.0)
MCV: 79.3 fL — ABNORMAL LOW (ref 80.0–100.0)
Platelets: 303 10*3/uL (ref 150–400)
RBC: 4.01 MIL/uL — ABNORMAL LOW (ref 4.22–5.81)
RDW: 17 % — ABNORMAL HIGH (ref 11.5–15.5)
WBC: 8.8 10*3/uL (ref 4.0–10.5)
nRBC: 0 % (ref 0.0–0.2)

## 2022-02-16 LAB — HEPARIN LEVEL (UNFRACTIONATED): Heparin Unfractionated: 0.16 IU/mL — ABNORMAL LOW (ref 0.30–0.70)

## 2022-02-16 MED ORDER — OXYCODONE HCL 5 MG PO TABS
2.5000 mg | ORAL_TABLET | ORAL | Status: DC
Start: 2022-02-16 — End: 2022-02-16

## 2022-02-16 MED ORDER — ACETAMINOPHEN 500 MG PO TABS
1000.0000 mg | ORAL_TABLET | Freq: Three times a day (TID) | ORAL | Status: DC
  Administered 2022-02-16 – 2022-02-17 (×3): 1000 mg via ORAL
  Filled 2022-02-16 (×3): qty 2

## 2022-02-16 MED ORDER — OXYCODONE-ACETAMINOPHEN 5-325 MG PO TABS: 1.0000 | ORAL_TABLET | ORAL | Status: DC

## 2022-02-16 MED ORDER — OXYCODONE HCL 5 MG PO TABS
15.0000 mg | ORAL_TABLET | Freq: Four times a day (QID) | ORAL | Status: DC
  Administered 2022-02-16 – 2022-02-17 (×5): 15 mg via ORAL
  Filled 2022-02-16 (×5): qty 3

## 2022-02-16 MED ORDER — GABAPENTIN 300 MG PO CAPS
300.0000 mg | ORAL_CAPSULE | Freq: Every evening | ORAL | Status: DC
  Administered 2022-02-16: 300 mg via ORAL
  Filled 2022-02-16: qty 1

## 2022-02-16 NOTE — Progress Notes (Signed)
Occupational Therapy Treatment Patient Details Name: [name] [name] MRN: [id number] DOB: 1961/05/02 Today's Date: 02/16/2022   History of present illness 61 y.o. male presents to [facility] hospital on 02/11/2022 with SOB, having a recent admission 8/8 for cardiac arrest, inferior STEMI and PE. Chest x-ray this admission demonstrates L atelectasis with pleural effusion. CT abdomen/pelvis concerning for primary pancreatic adenocarcinoma with metastatic disease to liver. Thoracentesis 8/19, liver biopsy 8/23. PMH includes aortic valve mass, HTN, Lupus, PVD, PNA, RA, L AKA.   OT comments  Patient with fair progress toward patient focused goals.  He continues to need verbal, tactile and demonstration for basic transfers and bed mobility to minimize pain and discomfort, limited retention and follow through.  He wants to return home, and his roommates are willing to provide the necessary 24 hour Mod A for ADL and transfers.  HH OT can be considered to assist with the transition home, and needed caregiver training.     Recommendations for follow up therapy are one component of a multi-disciplinary discharge planning process, led by the attending physician.  Recommendations may be updated based on patient status, additional functional criteria and insurance authorization.    Follow Up Recommendations  Home health OT    Assistance Recommended at Discharge Frequent or constant Supervision/Assistance  Patient can return home with the following  A lot of help with bathing/dressing/bathroom;Assistance with cooking/housework;Assist for transportation;A lot of help with walking and/or transfers;Help with stairs or ramp for entrance   Equipment Recommendations  None recommended by OT    Recommendations for Other Services      Precautions / Restrictions Precautions Precautions: Fall Precaution Comments: L AKA, prosthesis Restrictions Weight Bearing Restrictions: No       Mobility Bed Mobility Overal bed  mobility: Needs Assistance Bed Mobility: Sit to Supine       Sit to supine: Min guard        Transfers Overall transfer level: Needs assistance   Transfers: Bed to chair/wheelchair/BSC     Squat pivot transfers: Mod assist             Balance Overall balance assessment: Needs assistance Sitting-balance support: No upper extremity supported, Feet supported Sitting balance-Leahy Scale: Fair     Standing balance support: Bilateral upper extremity supported Standing balance-Leahy Scale: Poor                             ADL either performed or assessed with clinical judgement   ADL       Grooming: Wash/dry hands;Wash/dry face;Set up;Sitting   Upper Body Bathing: Set up;Sitting   Lower Body Bathing: Moderate assistance;Sitting/lateral leans   Upper Body Dressing : Set up;Sitting   Lower Body Dressing: Sit to/from stand;Moderate assistance;Sitting/lateral leans   Toilet Transfer: Cueing for safety;Cueing for sequencing;BSC/3in1;Squat-pivot;Requires drop arm;Moderate assistance                  Extremity/Trunk Assessment Upper Extremity Assessment Upper Extremity Assessment: Generalized weakness RUE Deficits / Details: RA deformities to hand LUE Deficits / Details: RA deformities to hand   Lower Extremity Assessment Lower Extremity Assessment: Defer to PT evaluation LLE Deficits / Details: LLE AKA, ROM WFL   Cervical / Trunk Assessment Cervical / Trunk Assessment: Kyphotic    Vision Baseline Vision/History: 0 No visual deficits     Perception     Praxis      Cognition Arousal/Alertness: Awake/alert Behavior During Therapy: WFL for tasks assessed/performed Overall Cognitive Status:  Impaired/Different from baseline                           Safety/Judgement: Decreased awareness of safety, Decreased awareness of deficits   Problem Solving: Slow processing          Exercises      Shoulder Instructions        General Comments VSS on RA    Pertinent Vitals/ Pain       Pain Assessment Faces Pain Scale: Hurts even more Pain Location: ribs and chest Pain Descriptors / Indicators: Tender, Jabbing Pain Intervention(s): Monitored during session, RN gave pain meds during session  Home Living Family/patient expects to be discharged to:: Private residence Living Arrangements: Non-relatives/Friends Available Help at Discharge: Friend(s);Available 24 hours/day Type of Home: House Home Access: Ramped entrance     Home Layout: One level     Bathroom Shower/Tub: Teacher, early years/pre: Standard Bathroom Accessibility: Yes   Home Equipment: Tub bench;Grab bars - tub/shower;Rolling Walker (2 wheels);Wheelchair - manual;BSC/3in1;Hand held shower head          Prior Functioning/Environment              Frequency  Min 2X/week        Progress Toward Goals  OT Goals(current goals can now be found in the care plan section)  Progress towards OT goals: Progressing toward goals  Acute Rehab OT Goals OT Goal Formulation: With patient Time For Goal Achievement: 03/01/22 Potential to Achieve Goals: [place] Discharge plan needs to be updated    Co-evaluation                 AM-PAC OT "6 Clicks" Daily Activity     Outcome Measure   Help from another person eating meals?: None Help from another person taking care of personal grooming?: A Little Help from another person toileting, which includes using toliet, bedpan, or urinal?: A Lot Help from another person bathing (including washing, rinsing, drying)?: A Lot Help from another person to put on and taking off regular upper body clothing?: A Little Help from another person to put on and taking off regular lower body clothing?: A Lot 6 Click Score: 16    End of Session    OT Visit Diagnosis: Other abnormalities of gait and mobility (R26.89)   Activity Tolerance Patient limited by fatigue   Patient Left in  bed;with call bell/phone within reach   Nurse Communication Mobility status        Time: 6073-7106 OT Time Calculation (min): 16 min  Charges: OT General Charges $OT Visit: 1 Visit OT Treatments $Self Care/Home Management : 8-22 mins  02/16/2022  RP, OTR/L  Acute Rehabilitation Services  Office:  [phone]   Metta Clines 02/16/2022, 2:21 PM

## 2022-02-16 NOTE — Consult Note (Addendum)
[place]  [phone] [phone]   [place]  Referral MD: Dr. Angelica Pou  Reason for Referral: Liver mass, pancreatic head lesion, mass in the esophagogastric junction with metastatic lymphadenopathy in the abdomen and pelvis, peritoneal thickening in the lower abdomen and pelvis  HPI: [name] [name] is a 61 year old male with a past medical history significant for RA, PAD status post left AKA in 2021, CAD, PE and DVT on DOAC, recent cardiac arrest from inferior STEMI.  He presented to the emergency department with shortness of breath.  In the emergency department, his BNP was mildly elevated.  Troponin was also elevated but EKG did not reveal any new ST elevation.  Chest x-ray showed left-sided atelectasis with pleural effusion.  CTA chest showed stable PE with left greater than right small to moderate pleural effusion with bibasilar collapse/consolidation.  He was started on IV antibiotics.  On the CT chest, a 7.8 x 8.5 cm subtle hypoattenuating mass lesion was seen in segment IV of the liver which could represent metastatic disease or a primary liver mass.  He was also noted to have hepatic steatosis.  CT abdomen/pelvis was also performed which showed an 8.7 x 7.9 x 7.4 cm ill-defined irregular mass in the medial segment of the left liver, diffuse peripancreatic edema with ill-defined parenchyma in the head of the pancreas including a 3.0 x 2.9 cm hypoenhancing ill-defined focal pancreatic head lesion, 4.5 x 3.4 cm necrotic mass lesion just cranial to the esophogastric junction with metastatic lymphadenopathy in the abdomen and pelvis, irregular nodular peritoneal thickening in the lower abdomen and pelvis concerning for metastatic disease.  The patient was seen by GI who recommended MRI of the abdomen.  MRI of the abdomen showed a rim-enhancing, likely necrotic mass or lymph node conglomerate centered around the gastroesophageal  junction measuring 5.0 x 3.9 cm, large, rim-enhancing likely necrotic liver mass centered in the anterior left lobe of the liver measuring 7.8 x 7.2 cm, enlarged gastrohepatic ligament and retroperitoneal lymph nodes.  He underwent ultrasound-guided thoracentesis on 02/11/2022 with 300 cc of fluid removed.  A CEA and CA 19.9 were obtained on 02/11/2022 and were elevated.  aFP was normal. Cytology was negative for malignant cells. He had an ultrasound-guided liver biopsy performed on 02/15/2022.  Biopsy results are currently pending.  Today, patient is sitting up in the recliner.  He is resting quietly.  He awakens easily.  The patient states that his appetite has been good and he has not lost weight recently.  He denies any difficulty with swallowing or the sensation of food getting stuck when he tries to swallow.  He is not having any headaches, dizziness, chest pain, shortness of breath, abdominal pain, nausea, vomiting.  The patient is single and has no children.  He was residing in a skilled nursing facility prior to this admission.  However, he tells me he is not sure where he will go once he is discharged from the hospital.  The patient has smoked 1 pack of cigarettes for many years but quit about 8 to 9 weeks ago.  Quit drinking alcohol in early 90s.  States that he had a paternal cousin that had cancer but he does not know what type.  Medical oncology was asked see the patient make recommendations regarding his abnormal imaging findings.    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   Hypertension    states is borderline on medication; has been on med. x 5-6 yr.   Hypokalemia 05/30/2018   Impingement syndrome of shoulder region 10/2014    left   Insomnia 06/01/2015   Ischemic ulcer of toe of left foot (HCC)    Left shoulder pain 12/16/2015   Low back pain without sciatica 10/29/2009   Lupus ([place])    Peripheral vascular disease (HCC)    Pneumonia    RA (rheumatoid arthritis) ([place])    Rotator cuff tear 11/04/2014  :   Past Surgical History:  Procedure Laterality Date   ABDOMINAL AORTAGRAM  02/20/2019   ABDOMINAL AORTOGRAM W/LOWER EXTREMITY N/A 02/20/2019   Procedure: ABDOMINAL AORTOGRAM W/LOWER EXTREMITY;  Surgeon: Marty Heck, MD;  Location: [facility];  Service: Cardiovascular;  Laterality: N/A;   ACHILLES TENDON SURGERY Bilateral    AMPUTATION Left 05/11/2020   Procedure: LEFT ABOVE KNEE AMPUTATION;  Surgeon: Angelia Mould, MD;  Location: [facility];  Service: Vascular;  Laterality: Left;   ENDARTERECTOMY POPLITEAL Left 05/01/2020   Procedure: ENDARTERECTOMY POPLITEAL;  Surgeon: Waynetta Sandy, MD;  Location: [place];  Service: Vascular;  Laterality: Left;   FASCIOTOMY Left 05/01/2020   Procedure: FASCIOTOMY;  Surgeon: Waynetta Sandy, MD;  Location: [facility];  Service: Vascular;  Laterality: Left;   FEMORAL-POPLITEAL BYPASS GRAFT Left 02/26/2019   Procedure: BYPASS GRAFT FEMORAL-POPLITEAL ARTERY LEFT LEG USING 31m PROPATEN GRAFT;  Surgeon: BSerafina Mitchell MD;  [place] [facility]  Service: Vascular;  Laterality: Left;   INTRAOPERATIVE ARTERIOGRAM Left 01/22/2020   Procedure: INTRA OPERATIVE ARTERIOGRAM with administration of thrombolyics in Left femoral to popliteal bypass.;  Surgeon: CMarty Heck MD;  [place] [facility]  Service: Vascular;  Laterality: Left;   LEFT HEART CATH AND CORONARY ANGIOGRAPHY N/A 03/15/2017   Procedure: LEFT HEART CATH AND CORONARY ANGIOGRAPHY;  Surgeon: ENelva Bush MD;  [facility];  Service: Cardiovascular;  Laterality: N/A;   LEFT HEART CATH AND CORONARY ANGIOGRAPHY N/A 02/06/2022   Procedure: LEFT HEART CATH AND CORONARY ANGIOGRAPHY;   Surgeon: JMartinique Peter M, MD;  [facility];  Service: Cardiovascular;  Laterality: N/A;   LOWER EXTREMITY INTERVENTION Left 04/29/2020   Procedure: LOWER EXTREMITY INTERVENTION- LYSIS;  Surgeon: CMarty Heck MD;  [facility];  Service: Cardiovascular;  Laterality: Left;   OSTEOTOMY AND ULNAR SHORTENING Right 07/22/2002   PATCH ANGIOPLASTY Left 05/01/2020   Procedure: PATCH ANGIOPLASTY USING HEMASHIELD PLATINUM FINESSE PATCH;  Surgeon: CWaynetta Sandy MD;  [place] [facility]  Service: Vascular;  Laterality: Left;   PERIPHERAL VASCULAR ATHERECTOMY  01/23/2020   Procedure: PERIPHERAL VASCULAR ATHERECTOMY;  Surgeon: CMarty Heck MD;  [facility];  Service: Cardiovascular;;   PERIPHERAL VASCULAR BALLOON ANGIOPLASTY  01/23/2020   Procedure: PERIPHERAL VASCULAR BALLOON ANGIOPLASTY;  Surgeon: CMarty Heck MD;  [facility];  Service: Cardiovascular;;   PERIPHERAL VASCULAR INTERVENTION Left 04/30/2020   Procedure: PERIPHERAL VASCULAR INTERVENTION;  Surgeon: HCherre Robins MD;  [facility];  Service: Cardiovascular;  Laterality: Left;   PERIPHERAL VASCULAR THROMBECTOMY N/A 01/23/2020   Procedure: lysis recheck;  Surgeon: CMarty Heck MD;  [facility];  Service: Cardiovascular;  Laterality: N/A;  + [place]    PERIPHERAL VASCULAR THROMBECTOMY N/A 04/30/2020   Procedure: Lysis Recheck;  Surgeon: Cherre Robins, MD;  Location: [facility];  Service: Cardiovascular;  Laterality: N/A;   SHOULDER ARTHROSCOPY WITH BICEPSTENOTOMY Right 03/11/2015   Procedure: SHOULDER ARTHROSCOPY WITH BICEPSTENOTOMY;  Surgeon: Ninetta Lights, MD;  Location: [place] [place];  Service: Orthopedics;  Laterality: Right;   SHOULDER ARTHROSCOPY WITH DISTAL CLAVICLE RESECTION Left 11/19/2014   Procedure: SHOULDER ARTHROSCOPY WITH DISTAL CLAVICLE RESECTION;  Surgeon: Kathryne Hitch,  MD;  Location: [place];  Service: Orthopedics;  Laterality: Left;   SHOULDER ARTHROSCOPY WITH ROTATOR CUFF REPAIR AND SUBACROMIAL DECOMPRESSION Left 11/19/2014   Procedure: LEFT SHOULDER ARTHROSCOPY, DEBRIDEMENT DISTAL CLAVICLE EXCISION, ACROMIOPLASTY WITH ROTATOR CUFF REPAIR ;  Surgeon: Kathryne Hitch, MD;  Location: [facility];  Service: Orthopedics;  Laterality: Left;   THROMBECTOMY OF BYPASS GRAFT FEMORAL- POPLITEAL ARTERY Left 05/01/2020   Procedure: THROMBECTOMY OF LOWER EXTREMITY;  Surgeon: Waynetta Sandy, MD;  Location: [place];  Service: Vascular;  Laterality: Left;   TRANSFORAMINAL LUMBAR INTERBODY FUSION (TLIF) WITH PEDICLE SCREW FIXATION 1 LEVEL N/A 01/03/2018   Procedure: TRANSFORAMINAL LUMBAR INTERBODY FUSION (TLIF) LUMBAR FIVE-SACRAL ONE;  Surgeon: Melina Schools, MD;  Location: [facility];  Service: Orthopedics;  Laterality: N/A;   ULTRASOUND GUIDANCE FOR VASCULAR ACCESS Right 01/22/2020   Procedure: ULTRASOUND GUIDANCE FOR VASCULAR ACCESS Right Common Femoral Artery.;  Surgeon: Marty Heck, MD;  Location: [place];  Service: Vascular;  Laterality: Right;   VIDEO ASSISTED THORACOSCOPY (VATS)/DECORTICATION Right 11/21/2011   drainage of empyema  :   Current Facility-Administered Medications  Medication Dose Route Frequency Provider Last Rate Last Admin   acetaminophen (TYLENOL) tablet 1,000 mg  1,000 mg Oral TID Iona Coach, MD       apixaban Arne Cleveland) tablet 5 mg  5 mg Oral BID Iona Coach, MD   5 mg at 02/16/22 0827   atorvastatin (LIPITOR) tablet 80 mg  80 mg Oral q1800 Gaylan Gerold, DO   80 mg at 02/15/22 1747   clopidogrel (PLAVIX) tablet 75 mg  75 mg Oral Daily Iona Coach, MD   75 mg at 02/16/22 0827   DULoxetine (CYMBALTA) DR capsule 30 mg  30 mg Oral Daily Gaylan Gerold, DO   30 mg at 02/15/22 2237   gabapentin (NEURONTIN) capsule 300 mg  300 mg Oral Nightly Iona Coach, MD       lidocaine (LIDODERM) 5 % 1 patch  1 patch  Transdermal Q24H Gaylan Gerold, DO   1 patch at 02/16/22 0830   metoprolol tartrate (LOPRESSOR) tablet 12.5 mg  12.5 mg Oral BID Gaylan Gerold, DO   12.5 mg at 02/16/22 6144   oxyCODONE (Oxy IR/ROXICODONE) immediate release tablet 15 mg  15 mg Oral Q6H Iona Coach, MD       oxyCODONE (Oxy IR/ROXICODONE) immediate release tablet 5 mg  5 mg Oral Q6H PRN Iona Coach, MD   5 mg at 02/16/22 1024   polyethylene glycol (MIRALAX / GLYCOLAX) packet 17 g  17 g Oral Daily PRN Gaylan Gerold, DO   17 g at 02/13/22 0855   varenicline (CHANTIX) tablet 1 mg  1 mg Oral BID Gaylan Gerold, DO   1 mg at 02/16/22 0827    Allergies  Allergen Reactions   Dilaudid [Hydromorphone Hcl] Rash   Ramipril Rash and Other (See Comments)    Per patient on R arm  and leg only; no angioedema   Tramadol Itching and Rash  :  Family History  Problem Relation Age of Onset   Heart attack Father 65   Alcohol abuse Father    Aneurysm Mother    Stroke Neg Hx    Cancer Neg Hx   :  Social History   Socioeconomic History   Marital status: Single    Spouse name: Not on file   Number of children: 0   Years of education: Not on file   Highest education level: Not on file  Occupational History   Occupation: Disabled    Comment: 2/2 RA  Tobacco Use   Smoking status: Former    Packs/day: 0.25    Years: 20.00    Total pack years: 5.00    Types: Cigarettes    Quit date: 02/19/2019    Years since quitting: 2.9   Smokeless tobacco: Never   Tobacco comments:    Patient stated he smokes a couple day and is attempting to quit smoking  Vaping Use   Vaping Use: Never used  Substance and Sexual Activity   Alcohol use: No    Alcohol/week: 0.0 standard drinks of alcohol    Comment: sober 1998   Drug use: No   Sexual activity: Not on file  Other Topics Concern   Not on file  Social History Narrative   On disability since 1992, used to work with city of [place]. Quit drinking in 1990.      Current Social History  11/05/2019        Patient lives by himself most of the time. Sometimes girlfriend's son stays with him in a one level home. There are 3 steps with handrail up to the entrance the patient uses.       Patient's method of transportation is personal truck. This was recently vandalized and patient doesn't have funds to repair at this time.      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, BSN, RN-BC    Social Determinants of Health   Financial Resource Strain: Not on file  Food Insecurity: No Food Insecurity (01/27/2022)   Hunger Vital Sign    Worried About Running Out of Food in the Last Year: Never true    [place] in the Last Year: Never true  Transportation Needs: No Transportation Needs (01/27/2022)   PRAPARE - Hydrologist (Medical): No    Lack of Transportation (Non-Medical): No  Physical Activity: Not on file  Stress: Not on file  Social Connections: Not on file  Intimate Partner Violence: Not on file  :  Review of Systems: A comprehensive 14 point review of systems was negative except as noted in the HPI.  Exam: Patient Vitals for the past 24 hrs:  BP Temp Temp src Pulse Resp SpO2 Weight  02/16/22 0827 134/87 -- -- 92 (!) 21 96 % --  02/16/22 0320 128/88 97.7 F (36.5 C) Oral 93 20 98 % 79.2 kg  02/15/22 2009 (!) 138/93 97.6 F (36.4 C) Oral 93 20 95 % --  02/15/22 1750 117/83 97.7 F (36.5 C) -- 93 (!) 22 -- --  02/15/22 1721 117/83 -- -- 93 (!) 22 96 % --  02/15/22 1653 107/61 -- -- 90 20 95 % --  02/15/22 1635 111/82  -- -- 94 (!) 23 95 % --  02/15/22 1621 (!) 91/59 -- -- 95 (!) 21 96 % --  02/15/22 1606 116/79 -- -- 94 (!) 23 96 % --  02/15/22 1551 (!) 157/95 -- -- 99 15 95 % --  02/15/22 1453 -- 97.7 F (36.5 C) Oral 87 -- -- --  02/15/22 1440 120/80 -- -- 91 (!) 25 94 % --  02/15/22 1430 131/89 -- -- 91 (!) 24 95 % --  02/15/22 1425 121/88 -- -- 91 (!) 22 96 % --  02/15/22 1400 130/89 -- -- 87 20 95 % --    General: Sitting up in chair, no distress. Eyes:  no scleral icterus.   ENT:  There were no oropharyngeal lesions.    Lymphatics:  Negative cervical, supraclavicular or axillary adenopathy.   Respiratory: Rales to the lung bases. Cardiovascular:  Regular rate and rhythm, S1/S2, without murmur, rub or gallop.  There was no pedal edema.   GI: Positive bowel sounds, soft, nontender. Musculoskeletal: Left AKA. Skin exam was without echymosis, petichae.   Neuro exam was nonfocal. Patient was alert and oriented.       Lab Results  Component Value Date   WBC 8.8 02/16/2022   HGB 10.3 (L) 02/16/2022   HCT 31.8 (L) 02/16/2022   PLT 303 02/16/2022   GLUCOSE 137 (H) 02/15/2022   CHOL 165 01/31/2022   TRIG 226 (H) 02/01/2022   HDL 19 (L) 01/31/2022   LDLCALC 90 01/31/2022   ALT 43 02/11/2022   AST 29 02/11/2022   NA 133 (L) 02/15/2022   K 4.1 02/15/2022   CL 100 02/15/2022   CREATININE 0.97 02/15/2022   BUN 20 02/15/2022   CO2 24 02/15/2022    US BIOPSY (LIVER)  Result Date: 02/15/2022 INDICATION: No known primary, now with large hepatic mass as well as upper abdominal lymphadenopathy worrisome for metastatic disease. Please perform ultrasound-guided liver mass biopsy for tissue diagnostic purposes. EXAM: ULTRASOUND GUIDED LIVER LESION BIOPSY COMPARISON:  Abdominal MRI-02/11/2022; CT abdomen and pelvis-02/11/2022 MEDICATIONS: None ANESTHESIA/SEDATION: Moderate (conscious) sedation was employed during this procedure as administered by the Interventional Radiology RN. A total of Versed 1 mg  and Fentanyl 25 mcg was administered intravenously. Moderate Sedation Time: 14 minutes. The patient's level of consciousness and vital signs were monitored continuously by radiology nursing throughout the procedure under my direct supervision. COMPLICATIONS: None immediate. PROCEDURE: Informed written consent was obtained from the patient after a discussion of the risks, benefits and alternatives to treatment. The patient understands and consents the procedure. A timeout was performed prior to the initiation of the procedure. Ultrasound scanning was performed of the right upper abdominal quadrant demonstrates an at least 7.9 x 7.6 cm hypoechoic mass involving the majority of the medial segment of the left lobe of the liver (image 5), compatible with the dominant mass seen on preceding abdominal MRI. The procedure was planned. The right upper abdominal quadrant was prepped and draped in the usual sterile fashion. The overlying soft tissues were anesthetized with 1% lidocaine with epinephrine. A 17 gauge, 6.8 cm  co-axial needle was advanced into a peripheral aspect of the lesion. This was followed by 6 core biopsies with an 18 gauge core device under direct ultrasound guidance. The coaxial needle tract was embolized with a small amount of Gel-Foam slurry and superficial hemostasis was obtained with manual compression. Post procedural scanning was negative for definitive area of hemorrhage or additional complication. A dressing was placed. The patient tolerated the procedure well without immediate post procedural complication. IMPRESSION: Technically successful ultrasound guided core needle biopsy of large mass occupying the majority of the medial segment of the left lobe of the liver. Electronically Signed   By: Sandi Mariscal M.D.   On: 02/15/2022 16:27   US THORACENTESIS ASP PLEURAL SPACE W/IMG GUIDE  Result Date: 02/12/2022 INDICATION: Left pleural effusion EXAM: ULTRASOUND GUIDED Left THORACENTESIS MEDICATIONS:  10 cc 1% lidocaine. COMPLICATIONS: None immediate. PROCEDURE: An ultrasound guided thoracentesis was thoroughly discussed with the patient and questions answered. The benefits, risks, alternatives and complications were also discussed. The patient understands and wishes to proceed with the procedure. Written consent was obtained. Ultrasound was performed to localize and mark an adequate pocket of fluid in the left chest. The area was then prepped and draped in the normal sterile fashion. 1% Lidocaine was used for local anesthesia. Under ultrasound guidance a Yueh catheter was introduced. Thoracentesis was performed. The catheter was removed and a dressing applied. FINDINGS: A total of approximately 300 cc of blood tinged fluid was removed. Samples were sent to the laboratory as requested by the clinical team. IMPRESSION: Successful ultrasound guided left thoracentesis yielding 300 cc of pleural fluid. CXR: No PTX Read by [facility] Electronically Signed   By: Corrie Mckusick D.O.   On: 02/12/2022 08:35   MR ABDOMEN W WO CONTRAST  Result Date: 02/11/2022 CLINICAL DATA:  Liver, pancreatic, and esophageal masses identified by CT EXAM: MRI ABDOMEN WITHOUT AND WITH CONTRAST TECHNIQUE: Multiplanar multisequence MR imaging of the abdomen was performed both before and after the administration of intravenous contrast. CONTRAST:  37m GADAVIST GADOBUTROL 1 MMOL/ML IV SOLN COMPARISON:  Same-day CT abdomen pelvis FINDINGS: Lower chest: Cardiomegaly. Small bilateral pleural effusions and associated atelectasis or consolidation. Hepatobiliary: Hepatomegaly, maximum coronal span 24.4 cm. Probable hepatic steatosis, however due to technical error only in phase images are submitted for review. Large, rim enhancing, internally hypoenhancing liver mass centered in the anterior left lobe of the liver, hepatic segments IVA and IVB, measuring 7.8 x 7.2 cm (series 13, image 31). No gallstones, gallbladder wall thickening, or  biliary dilatation. Pancreas: Diffuse inflammatory fat stranding and fluid about the pancreas and adjacent retroperitoneum. Multiple, lobulated, rim enhancing fluid signal lesions throughout the pancreatic head and neck, largest centrally measuring 3.6 x 2.8 cm (series 6, image 27). Pancreatic ductal dilatation. Spleen: Splenomegaly, maximum coronal span 14.5 cm. Adrenals/Urinary Tract: Adrenal glands are unremarkable. Kidneys are normal, without renal calculi, solid lesion, or hydronephrosis. Stomach/Bowel: Rim enhancing, internally hypoenhancing mass or lymph node conglomerate centered about the gastroesophageal junction, measuring 5.0 x 3.9 cm (series 13, image 29). No evidence of bowel wall thickening, distention, or inflammatory changes. Vascular/Lymphatic: Aortic atherosclerosis. Rim enhancing, internally hypoenhancing gastrohepatic ligament lymph node measuring up to 2.7 x 2.5 cm (series 12, image 40). Enlarged heterogeneously enhancing retroperitoneal lymph nodes measuring up to 2.3 x 1.6 cm (series 12, image 74). Other: No abdominal wall hernia. Anasarca small perihepatic and perisplenic ascites. Musculoskeletal: No acute or significant osseous findings. IMPRESSION: 1. Rim enhancing, likely necrotic mass or lymph node  conglomerate centered about the gastroesophageal junction, measuring 5.0 x 3.9 cm. 2. Large, rim enhancing, likely necrotic liver mass centered in the anterior left lobe of the liver, measuring 7.8 x 7.2 cm. 3. Enlarged gastrohepatic ligament and retroperitoneal lymph nodes. 4. Constellation of findings is most consistent with primary esophageal malignancy and associated metastatic disease. 5. Diffuse inflammatory fat stranding and fluid about the pancreas and adjacent retroperitoneum, with multiple, lobulated, rim enhancing fluid signal lesions throughout the pancreatic head and neck, largest centrally measuring 3.6 x 2.8 cm. Findings are most consistent with acute pancreatitis complicated by  acute pancreatic fluid collections, the character and fluid composition of these pancreatic lesions distinct from esophageal and liver masses. 6. Small bilateral pleural effusions and associated atelectasis or consolidation. 7. Anasarca. Electronically Signed   By: Delanna Ahmadi M.D.   On: 02/11/2022 19:29   DG Chest 1 View  Result Date: 02/11/2022 CLINICAL DATA:  Status post thoracentesis EXAM: CHEST  1 VIEW COMPARISON:  02/11/2022 FINDINGS: Trace left pleural effusion. No focal consolidation. No pneumothorax. No right pleural effusion. Stable cardiomegaly. No acute osseous abnormality. IMPRESSION: 1. Trace left pleural effusion. No pneumothorax. Electronically Signed   By: Kathreen Devoid M.D.   On: 02/11/2022 10:33   CT ABDOMEN PELVIS W CONTRAST  Result Date: 02/11/2022 CLINICAL DATA:  Liver mass on CT chest. Evaluate for metastatic disease. * Tracking Code: BO * EXAM: CT ABDOMEN AND PELVIS WITH CONTRAST TECHNIQUE: Multidetector CT imaging of the abdomen and pelvis was performed using the standard protocol following bolus administration of intravenous contrast. RADIATION DOSE REDUCTION: This exam was performed according to the departmental dose-optimization program which includes automated exposure control, adjustment of the mA and/or kV according to patient size and/or use of iterative reconstruction technique. CONTRAST:  50m OMNIPAQUE IOHEXOL 300 MG/ML  SOLN COMPARISON:  Chest CTA earlier same day FINDINGS: Lower chest: Bibasilar collapse/consolidation with small pleural effusions, left greater than right. Hepatobiliary: 8.7 x 7.9 x 7.4 cm ill-defined irregular mass is identified in the medial segment left liver. This appears to be isolated with no other suspicious mass lesion evident within the hepatic parenchyma. Gallbladder is nondistended with apparent trace amount of gas in the gallbladder lumen (see axial 35/3). No substantial intrahepatic biliary duct dilatation with extrahepatic common bile duct  not well seen in the head of the pancreas but measuring approximately 6 mm diameter just proximal to the ampulla. Pancreas: There is diffuse peripancreatic edema with ill-defined parenchyma in the head of the pancreas including a 3.0 x 2.9 cm hypoenhancing ill-defined focal lesion visible on 41/3. No substantial main duct dilatation evident. Spleen: No splenomegaly. No focal mass lesion. Adrenals/Urinary Tract: No adrenal nodule or mass. Kidneys unremarkable. No evidence for hydroureter. The urinary bladder appears normal for the degree of distention. Stomach/Bowel: Stomach is unremarkable. No gastric wall thickening. No evidence of outlet obstruction. Duodenum is normally positioned as is the ligament of Treitz. No small bowel wall thickening. No small bowel dilatation. The terminal ileum is normal. The appendix is normal. Diverticuli are seen scattered along the entire length of the colon without CT findings of diverticulitis. Vascular/Lymphatic: There is moderate atherosclerotic calcification of the abdominal aorta without aneurysm. 2.4 cm necrotic lymph node identified in the gastrohepatic ligament on image 30/3. 4.5 x 3.4 cm necrotic mass lesion is identified just cranial to the esophagogastric junction on image 22/3. 16 mm short axis portal caval lymph node is seen on 30/7/3. Calcified para-aortic nodal tissue is seen in the retroperitoneal space. Mild external  iliac lymphadenopathy seen bilaterally (right on 75/3 and left on 76/3) nodular irregular peritoneal thickening noted in the lower abdomen and pelvis (see left pelvis on 73/3). Reproductive: The prostate gland and seminal vesicles are unremarkable. Other: Trace free fluid is seen in the pelvis. Small volume free fluid seen adjacent to the liver and spleen tracking into the paracolic gutter bilaterally. Musculoskeletal: Minimal presacral edema evident. No worrisome lytic or sclerotic osseous abnormality. Status post L5-S1 fusion IMPRESSION: 1. 8.7 x 7.9  x 7.4 cm ill-defined irregular mass in the medial segment left liver. Given findings below, metastatic disease is favored although primary hepatic neoplasm is not entirely excluded. 2. Diffuse peripancreatic edema with ill-defined parenchyma in the head of the pancreas including a 3.0 x 2.9 cm hypoenhancing ill-defined focal pancreatic head lesion. Imaging features are considered suspicious for primary pancreatic adenocarcinoma. MRI abdomen with and without contrast may prove helpful to further evaluate. Endoscopic ultrasound likely prove helpful. 3. 4.5 x 3.4 cm necrotic mass lesion is identified just cranial to the esophagogastric junction with additional metastatic lymphadenopathy in the abdomen and pelvis. 4. Irregular nodular peritoneal thickening in the lower abdomen and pelvis, concerning for metastatic disease. 5. Bibasilar collapse/consolidation with small pleural effusions, left greater than right. 6. Trace amount of gas in the gallbladder lumen, presumably related to prior sphincterotomy. In the absence of prior sphincterotomy, ascending biliary infection would be a consideration. 7. Aortic Atherosclerosis (ICD10-I70.0). Electronically Signed   By: Misty Stanley M.D.   On: 02/11/2022 07:52   CT Angio Chest Pulmonary Embolism (PE) W or WO Contrast  Result Date: 02/11/2022 CLINICAL DATA:  Recent PE. Hypoxia this morning. Clinical concern for recurrent pulmonary embolus. EXAM: CT ANGIOGRAPHY CHEST WITH CONTRAST TECHNIQUE: Multidetector CT imaging of the chest was performed using the standard protocol during bolus administration of intravenous contrast. Multiplanar CT image reconstructions and MIPs were obtained to evaluate the vascular anatomy. RADIATION DOSE REDUCTION: This exam was performed according to the departmental dose-optimization program which includes automated exposure control, adjustment of the mA and/or kV according to patient size and/or use of iterative reconstruction technique.  CONTRAST:  181m OMNIPAQUE IOHEXOL 350 MG/ML SOLN COMPARISON:  01/31/2022 FINDINGS: Cardiovascular: Heart is enlarged. Mild atherosclerotic calcification is noted in the wall of the thoracic aorta. Left lower lobe segmental pulmonary embolus is stable in appearance (see image 155/series 7. The tiny segmental pulmonary embolus of the right upper lobe is also not appreciably changed in the interval (142/7). No new filling defect within the opacified pulmonary arteries to suggest the presence of additional acute pulmonary embolus on today's study. Mediastinum/Nodes: No mediastinal lymphadenopathy. There is no hilar lymphadenopathy. The esophagus has normal imaging features. There is no axillary lymphadenopathy. Lungs/Pleura: Patchy ground-glass opacities with superimposed nodular component seen previously in both upper lobes have resolved in the interval. There is a persistent 5 mm cavitary nodule in the anterior left upper lobe (76/6) bibasilar collapse/consolidation noted left greater than right. Small to moderate left and small right pleural effusions are progressive in the interval. Upper Abdomen: The liver shows diffusely decreased attenuation suggesting fat deposition. Subtle nodularity of liver contour raises the question of cirrhosis. 7.8 x 8.5 cm subtle hypoattenuating mass lesion is identified in segment IV, similar to prior. Small volume free fluid is seen adjacent to the liver and spleen. Musculoskeletal: No worrisome lytic or sclerotic osseous abnormality. Multiple bilateral anterior rib fractures again noted in this patient with a reported history of recent CPR. Review of the MIP images confirms the above  findings. IMPRESSION: 1. Similar appearance nonocclusive segmental and subsegmental pulmonary embolus to the left lower and right upper lobes. No new acute pulmonary embolus. 2. Interval resolution of the bilateral ground-glass opacities with superimposed nodular component seen previously in both upper  lobes. 3. Persistent 5 mm cavitary nodule in the anterior left upper lobe. Follow-up CT chest in 3 months recommended to ensure stability. 4. Bibasilar collapse/consolidation left greater than right. 5. Small to moderate left and small right pleural effusions are progressive in the interval. 6. 7.8 x 8.5 cm subtle hypoattenuating mass lesion in segment IV of the liver, similar to prior but new since 06/26/2020. This could be metastatic disease or a primary liver mass. Consider dedicated abdomen/pelvis CT with oral and intravenous contrast to further evaluate. 7. Multiple bilateral anterior rib fractures again noted in this patient with a reported history of recent CPR. 8. Small volume free fluid adjacent to the liver and spleen. 9. Hepatic steatosis. Subtle nodularity of liver contour raises the question of cirrhosis. 10. Aortic Atherosclerosis (ICD10-I70.0). Critical Value/emergent results were called by telephone at the time of interpretation on 02/11/2022 at 5:34 am to provider [facility] , who verbally acknowledged these results. Electronically Signed   By: Misty Stanley M.D.   On: 02/11/2022 05:35   DG Chest Port 1 View  Result Date: 02/11/2022 CLINICAL DATA:  Shortness of breath. EXAM: PORTABLE CHEST 1 VIEW COMPARISON:  February 01, 2022 FINDINGS: The endotracheal tube, nasogastric tube and left-sided venous catheter seen on the prior study have been removed. The cardiac silhouette is enlarged and predominant stable, given differences in technique. There is marked severity calcification of the aortic arch. Marked severity atelectasis and/or infiltrate is seen within the retrocardiac region of the left lung base. A small left pleural effusion is also noted. No pneumothorax is identified. The visualized skeletal structures are unremarkable. IMPRESSION: 1. Marked severity left basilar atelectasis and/or infiltrate. 2. Small left pleural effusion. Electronically Signed   By: Virgina Norfolk M.D.   On:  02/11/2022 02:39   CARDIAC CATHETERIZATION  Result Date: 02/06/2022   Dist Cx lesion is 30% stenosed.   Prox RCA lesion is 100% stenosed.   LV end diastolic pressure is normal. Single vessel occlusive CAD. Occlusion of proximal nondominant RCA which is new since 2018. Normal LVEDP Plan: medical therapy   ECHOCARDIOGRAM LIMITED  Result Date: 02/01/2022    ECHOCARDIOGRAM LIMITED REPORT   Patient Name:   KASEEM VASTINE Date of Exam: 02/01/2022 Medical Rec #:  [id number]     Height:       68.0 in Accession #:    [id number]    Weight:       162.7 lb Date of Birth:  1961/01/04    BSA:          1.872 m Patient Age:    89 years      BP:           110/87 mmHg Patient Gender: M             HR:           110 bpm. Exam Location:  Inpatient Procedure: Limited Echo, Cardiac Doppler, Color Doppler and Intracardiac            Opacification Agent Indications:    Cardiac arrest  History:        Patient has prior history of Echocardiogram examinations, most                 recent  01/31/2022. PAD; Risk Factors:Hypertension and Current                 Smoker.  Sonographer:    Maudry Mayhew MHA, RDMS, RVT, RDCS Referring Phys: [id number] Candee Furbish  Sonographer Comments: Image acquisition challenging due to respiratory motion. IMPRESSIONS  1. Left ventricular ejection fraction, by estimation, is 60 to 65%. The left ventricle has normal function. Left ventricular diastolic parameters are indeterminate. There is the interventricular septum is flattened in systole, consistent with right ventricular pressure overload.  2. The Right ventricle is severly dilated with hypokinesis in the basal and mid portion but hyperkinesis in the apical portion - consistent with McConnell's sign which was also present in the TTE performed 01/31/22. Right ventricular systolic function is moderately reduced. The right ventricular size is severely enlarged. There is normal pulmonary artery systolic pressure.  3. The mitral valve is grossly normal. No  evidence of mitral valve regurgitation. No evidence of mitral stenosis.  4. The aortic valve is grossly normal. Aortic valve regurgitation is trivial. Aortic valve sclerosis is present, with no evidence of aortic valve stenosis.  5. The inferior vena cava is normal in size with greater than 50% respiratory variability, suggesting right atrial pressure of 3 mmHg. FINDINGS  Left Ventricle: Left ventricular ejection fraction, by estimation, is 60 to 65%. The left ventricle has normal function. Definity contrast agent was given IV to delineate the left ventricular endocardial borders. The left ventricular internal cavity size was small. The interventricular septum is flattened in systole, consistent with right ventricular pressure overload. Left ventricular diastolic parameters are indeterminate.  LV Wall Scoring: The apical lateral segment, apical anterior segment, apical inferior segment, and apex are akinetic. The anterior wall, antero-lateral wall, mid and distal anterior septum, inferior septum, inferior wall, and mid inferolateral segment are normal. Right Ventricle: The Right ventricle is severly dilated with hypokinesis in the basal and mid portion but hyperkinesis in the apical portion - consistent with McConnell's sign which was also present in the TTE performed 01/31/22. The right ventricular size  is severely enlarged. Right ventricular systolic function is moderately reduced. There is normal pulmonary artery systolic pressure. The tricuspid regurgitant velocity is 2.43 m/s, and with an assumed right atrial pressure of 3 mmHg, the estimated right  ventricular systolic pressure is 62.2 mmHg. Left Atrium: Left atrial size was not assessed. Right Atrium: Right atrial size was not assessed. Pericardium: Presence of epicardial fat layer. Mitral Valve: The mitral valve is grossly normal. No evidence of mitral valve stenosis. Tricuspid Valve: The tricuspid valve is grossly normal. Tricuspid valve regurgitation is not  demonstrated. No evidence of tricuspid stenosis. Aortic Valve: The aortic valve is grossly normal. Aortic valve regurgitation is trivial. Aortic valve sclerosis is present, with no evidence of aortic valve stenosis. Pulmonic Valve: The pulmonic valve was not well visualized. Aorta: The aortic root is normal in size and structure and the ascending aorta was not well visualized. Venous: The inferior vena cava is normal in size with greater than 50% respiratory variability, suggesting right atrial pressure of 3 mmHg. LEFT VENTRICLE PLAX 2D LVIDd:         4.70 cm Diastology LVIDs:         3.00 cm LV e' medial:    12.20 cm/s LV PW:         1.10 cm LV E/e' medial:  4.0 LV IVS:        0.90 cm LV e' lateral:   10.70 cm/s  LV E/e' lateral: 4.6  RIGHT VENTRICLE RV S prime:     9.14 cm/s LEFT ATRIUM         Index LA diam:    3.00 cm 1.60 cm/m   AORTA Ao Root diam: 3.60 cm MITRAL VALVE               TRICUSPID VALVE MV Area (PHT): 3.60 cm    TR Peak grad:   23.6 mmHg MV Decel Time: 211 msec    TR Vmax:        243.00 cm/s MV E velocity: 49.30 cm/s MV A velocity: 71.30 cm/s MV E/A ratio:  0.69 Kardie Tobb DO Electronically signed by Berniece Salines DO Signature Date/Time: 02/01/2022/11:59:14 AM    Final    EEG adult  Result Date: 02/01/2022 Lora Havens, MD     02/01/2022  8:34 AM Patient Name: SHIVA SAHAGIAN MRN: [id number] Epilepsy Attending: Lora Havens Referring Physician/Provider: Erick Colace, NP Date: 01/31/2022 Duration: 30.34 mins Patient history: 61 year old male status postcardiac arrest.  EEG to evaluate for seizure. Level of alertness: comatose AEDs during EEG study: Propofol Technical aspects: This EEG study was done with scalp electrodes positioned according to the 10-20 International system of electrode placement. Electrical activity was reviewed with band pass filter of 1-'70Hz'$ , sensitivity of 7 uV/mm, display speed of 77m/sec with a '60Hz'$  notched filter applied as appropriate. EEG data  were recorded continuously and digitally stored.  Video monitoring was available and reviewed as appropriate. Description: EEG showed continuous generalized 2 to 3 Hz With overriding drop to 40 Hz beta activity.  Intermittent episodes of subtle head jerks were noted without concomitant EEG change.  Hyperventilation and photic stimulation were not performed.   ABNORMALITY - Continuous slow, generalized IMPRESSION: This study is suggestive of severe diffuse encephalopathy, nonspecific etiology.  Intermittent episodes of head jerk were noted without concomitant EEG change or most likely not epileptic.  No seizures or epileptiform discharges were seen throughout the recording. PLora Havens  DG CHEST PORT 1 VIEW  Result Date: 02/01/2022 CLINICAL DATA:  61year old male central line placement. Pulmonary emboli. EXAM: PORTABLE CHEST 1 VIEW COMPARISON:  Portable chest 01/31/2022 and earlier. FINDINGS: Portable AP semi upright view at 0622 hours. Endotracheal tube and visible enteric tube appears stable. New left IJ approach central line in place. Catheter tip at or just below the carina (SVC level. No pneumothorax. Mediastinal contours are stable and within normal limits. Allowing for portable technique the lungs are now clear, and bilateral ventilation appears substantially improved from the chest CTA yesterday. IMPRESSION: 1. New left IJ approach central line with tip at the SVC level, no adverse features. 2. Otherwise stable lines and tubes. 3. Improved bilateral ventilation since the CTA yesterday. No new pulmonary abnormality. Electronically Signed   By: HGenevie AnnM.D.   On: 02/01/2022 06:33   [place] SITE RITE  Result Date: 01/31/2022 If Site Rite image not attached, placement could not be confirmed due to current cardiac rhythm.  DG CHEST PORT 1 VIEW  Result Date: 01/31/2022 CLINICAL DATA:  Pulmonary embolus, intubated EXAM: PORTABLE CHEST 1 VIEW COMPARISON:  823 FINDINGS: Single frontal view of the chest  demonstrates endotracheal tube overlying tracheal air column tip well above carina. Enteric catheter passes below diaphragm tip excluded by collimation. The cardiac silhouette is stable. Improved aeration of the left lung since prior study. No focal consolidation, effusion, or pneumothorax. No acute bony abnormality. IMPRESSION: 1. Support devices as above.  2. Improved aeration of the lungs, with resolution of the left-sided airspace disease seen previously. Electronically Signed   By: Randa Ngo M.D.   On: 01/31/2022 22:13   ECHOCARDIOGRAM COMPLETE  Result Date: 01/31/2022    ECHOCARDIOGRAM REPORT   Patient Name:   ULRICH SOULES Date of Exam: 01/31/2022 Medical Rec #:  [id number]     Height:       68.0 in Accession #:    [id number]    Weight:       178.0 lb Date of Birth:  05-09-1961    BSA:          1.945 m Patient Age:    54 years      BP:           130/94 mmHg Patient Gender: M             HR:           100 bpm. Exam Location:  Inpatient Procedure: 2D Echo, Cardiac Doppler and Color Doppler Indications:    Abnormal ECG R94.31  History:        Patient has prior history of Echocardiogram examinations, most                 recent 02/22/2019. PVD, AV Mass, Signs/Symptoms:Murmur; Risk                 Factors:Hypertension. Lupus, Anemia.  Sonographer:    Eartha Inch Referring Phys: [place] Comments: Echo performed with patient supine and on artificial respirator. Image acquisition challenging due to patient body habitus and Image acquisition challenging due to respiratory motion. IMPRESSIONS  1. Left ventricular ejection fraction, by estimation, is 60 to 65%. The left ventricle has normal function. The left ventricle has no regional wall motion abnormalities. Indeterminate diastolic filling due to E-A fusion. There is the interventricular septum is flattened in systole, consistent with right ventricular pressure overload.  2. Right ventricular systolic function is severely reduced.  The right ventricular size is mildly enlarged. Tricuspid regurgitation signal is inadequate for assessing PA pressure.  3. The mitral valve is normal in structure. No evidence of mitral valve regurgitation.  4. The aortic valve was not well visualized. Aortic valve regurgitation is not visualized. Aortic valve sclerosis is present, with no evidence of aortic valve stenosis. Comparison(s): Prior images reviewed side by side. The right ventricular hypertrophy is significantly worse. Conclusion(s)/Recommendation(s): Discussed findings with primary team. FINDINGS  Left Ventricle: Left ventricular ejection fraction, by estimation, is 60 to 65%. The left ventricle has normal function. The left ventricle has no regional wall motion abnormalities. The left ventricular internal cavity size was normal in size. There is  no left ventricular hypertrophy. The interventricular septum is flattened in systole, consistent with right ventricular pressure overload. Indeterminate diastolic filling due to E-A fusion. Right Ventricle: McConnell's sign is present, strongly correlated with acute cor pulmonale, most likely large pulmonary embolism. The right ventricular size is mildly enlarged. No increase in right ventricular wall thickness. Right ventricular systolic function is severely reduced. Tricuspid regurgitation signal is inadequate for assessing PA pressure. Left Atrium: Left atrial size was normal in size. Right Atrium: Right atrial size was normal in size. Pericardium: There is no evidence of pericardial effusion. Mitral Valve: The mitral valve is normal in structure. No evidence of mitral valve regurgitation. Tricuspid Valve: The tricuspid valve is normal in structure. Tricuspid valve regurgitation is not demonstrated. Aortic Valve: The aortic valve was not well visualized. Aortic  valve regurgitation is not visualized. Aortic valve sclerosis is present, with no evidence of aortic valve stenosis. Pulmonic Valve: The pulmonic  valve was not well visualized. Aorta: The aortic root is normal in size and structure. Venous: IVC assessment for right atrial pressure unable to be performed due to mechanical ventilation. IAS/Shunts: No atrial level shunt detected by color flow Doppler. Sanda Klein MD Electronically signed by Sanda Klein MD Signature Date/Time: 01/31/2022/4:17:16 PM    Final    CT Angio Chest PE W and/or Wo Contrast  Result Date: 01/31/2022 CLINICAL DATA:  Pulmonary embolism (PE) suspected, high prob EXAM: CT ANGIOGRAPHY CHEST WITH CONTRAST TECHNIQUE: Multidetector CT imaging of the chest was performed using the standard protocol during bolus administration of intravenous contrast. Multiplanar CT image reconstructions and MIPs were obtained to evaluate the vascular anatomy. RADIATION DOSE REDUCTION: This exam was performed according to the departmental dose-optimization program which includes automated exposure control, adjustment of the mA and/or kV according to patient size and/or use of iterative reconstruction technique. CONTRAST:  125m OMNIPAQUE IOHEXOL 350 MG/ML SOLN COMPARISON:  CTA 06/26/2020. FINDINGS: Cardiovascular: Satisfactory opacification of the pulmonary arteries to the segmental level. There is no acute pulmonary embolism at the branching of the left main pulmonary artery into the segmental lower lobe arteries (series 5, images 65-67, series 8, image [id number]). Additional small filling defect and a right upper lobe segmental artery (series 5, image 57). No central/saddle PE.Cardiomegaly. RV: LV ratio is 0.9. Trace pericardial effusion. Atherosclerosis of the thoracic aorta. Mediastinum/Nodes: No lymphadenopathy. The thyroid is unremarkable. Esophagus is unremarkable. Lungs/Pleura: There are confluent ground-glass opacities in the upper lungs bilaterally with areas of nodular consolidation and interlobular septal thickening. Bibasilar atelectasis. No pleural effusion. No pneumothorax. Mid to Upper Abdomen: No  acute abnormality. Musculoskeletal: There are multiple acute anterior rib fractures, involving ribs 2 through 7 bilaterally. Most of these are angulated and nondisplaced. Fractures involving ribs 3 through 5 on the right are mildly displaced. Nondisplaced mid sternal fracture. Review of the MIP images confirms the above findings. IMPRESSION: Acute segmental pulmonary emboli in the left lower and right upper lobes. No definite CT evidence of right heart strain. Complex ground-glass opacities in the mid to upper lungs bilaterally with areas of nodular consolidation and interlobular septal thickening. Findings are most consistent with contusions related to recent cardiopulmonary resuscitation, versus a multifocal infectious/inflammatory process. Bibasilar atelectasis. Multiple anterior rib fractures involving ribs 2 through 7 bilaterally. Nondisplaced mid sternal fracture. Critical Value/emergent results were called by telephone at the time of interpretation on 01/31/2022 at 2:29 pm to provider SSherwood Gambler, who verbally acknowledged these results. Electronically Signed   By: JMaurine SimmeringM.D.   On: 01/31/2022 14:36   CT Head Wo Contrast  Result Date: 01/31/2022 CLINICAL DATA:  Neuro deficit, acute, stroke suspected EXAM: CT HEAD WITHOUT CONTRAST TECHNIQUE: Contiguous axial images were obtained from the base of the skull through the vertex without intravenous contrast. RADIATION DOSE REDUCTION: This exam was performed according to the departmental dose-optimization program which includes automated exposure control, adjustment of the mA and/or kV according to patient size and/or use of iterative reconstruction technique. COMPARISON:  12/08/2015 FINDINGS: Brain: Late subacute to chronic appearing infarct within the inferior left occipital lobe. Otherwise, no evidence of acute infarction. No hemorrhage, hydrocephalus, extra-axial collection, or mass lesion. Scattered low-density changes within the periventricular and  subcortical white matter compatible with chronic microvascular ischemic change. Vascular: Atherosclerotic calcifications involving the large vessels of the skull base. No unexpected hyperdense  vessel. Skull: Normal. Negative for fracture or focal lesion. Sinuses/Orbits: Small retention cyst or polyp in the right maxillary sinus. Other: None. IMPRESSION: 1. Late subacute to chronic appearing infarct within the inferior left occipital lobe. Follow-up MRI can be performed to more accurately characterize. 2. Otherwise, no acute intracranial abnormality. Electronically Signed   By: Davina Poke D.O.   On: 01/31/2022 14:25   DG Chest Port 1 View  Result Date: 01/31/2022 CLINICAL DATA:  Post CPR EXAM: PORTABLE CHEST 1 VIEW COMPARISON:  Chest x-ray 06/26/2020 FINDINGS: Endotracheal tube in good position. NG tube in the stomach with the tip at the GE junction. Atherosclerotic calcification aortic arch. Right lung is clear. Mild left upper lobe and left lower lobe airspace disease. No left effusion. No acute skeletal abnormality IMPRESSION: Endotracheal tube in good position. NG tip in this stomach with the side hole in the distal esophagus. Mild airspace disease left upper lobe and left lower lobe. Possible atelectasis or aspiration. Electronically Signed   By: Franchot Gallo M.D.   On: 01/31/2022 12:50     US BIOPSY (LIVER)  Result Date: 02/15/2022 INDICATION: No known primary, now with large hepatic mass as well as upper abdominal lymphadenopathy worrisome for metastatic disease. Please perform ultrasound-guided liver mass biopsy for tissue diagnostic purposes. EXAM: ULTRASOUND GUIDED LIVER LESION BIOPSY COMPARISON:  Abdominal MRI-02/11/2022; CT abdomen and pelvis-02/11/2022 MEDICATIONS: None ANESTHESIA/SEDATION: Moderate (conscious) sedation was employed during this procedure as administered by the Interventional Radiology RN. A total of Versed 1 mg and Fentanyl 25 mcg was administered intravenously. Moderate  Sedation Time: 14 minutes. The patient's level of consciousness and vital signs were monitored continuously by radiology nursing throughout the procedure under my direct supervision. COMPLICATIONS: None immediate. PROCEDURE: Informed written consent was obtained from the patient after a discussion of the risks, benefits and alternatives to treatment. The patient understands and consents the procedure. A timeout was performed prior to the initiation of the procedure. Ultrasound scanning was performed of the right upper abdominal quadrant demonstrates an at least 7.9 x 7.6 cm hypoechoic mass involving the majority of the medial segment of the left lobe of the liver (image 5), compatible with the dominant mass seen on preceding abdominal MRI. The procedure was planned. The right upper abdominal quadrant was prepped and draped in the usual sterile fashion. The overlying soft tissues were anesthetized with 1% lidocaine with epinephrine. A 17 gauge, 6.8 cm co-axial needle was advanced into a peripheral aspect of the lesion. This was followed by 6 core biopsies with an 18 gauge core device under direct ultrasound guidance. The coaxial needle tract was embolized with a small amount of Gel-Foam slurry and superficial hemostasis was obtained with manual compression. Post procedural scanning was negative for definitive area of hemorrhage or additional complication. A dressing was placed. The patient tolerated the procedure well without immediate post procedural complication. IMPRESSION: Technically successful ultrasound guided core needle biopsy of large mass occupying the majority of the medial segment of the left lobe of the liver. Electronically Signed   By: Sandi Mariscal M.D.   On: 02/15/2022 16:27   US THORACENTESIS ASP PLEURAL SPACE W/IMG GUIDE  Result Date: 02/12/2022 INDICATION: Left pleural effusion EXAM: ULTRASOUND GUIDED Left THORACENTESIS MEDICATIONS: 10 cc 1% lidocaine. COMPLICATIONS: None immediate. PROCEDURE:  An ultrasound guided thoracentesis was thoroughly discussed with the patient and questions answered. The benefits, risks, alternatives and complications were also discussed. The patient understands and wishes to proceed with the procedure. Written consent was obtained. Ultrasound was  performed to localize and mark an adequate pocket of fluid in the left chest. The area was then prepped and draped in the normal sterile fashion. 1% Lidocaine was used for local anesthesia. Under ultrasound guidance a Yueh catheter was introduced. Thoracentesis was performed. The catheter was removed and a dressing applied. FINDINGS: A total of approximately 300 cc of blood tinged fluid was removed. Samples were sent to the laboratory as requested by the clinical team. IMPRESSION: Successful ultrasound guided left thoracentesis yielding 300 cc of pleural fluid. CXR: No PTX Read by Lav[facility] Drafts [facility] Electronically Signed   By: Corrie Mckusick D.O.   On: 02/12/2022 08:35   MR ABDOMEN W WO CONTRAST  Result Date: 02/11/2022 CLINICAL DATA:  Liver, pancreatic, and esophageal masses identified by CT EXAM: MRI ABDOMEN WITHOUT AND WITH CONTRAST TECHNIQUE: Multiplanar multisequence MR imaging of the abdomen was performed both before and after the administration of intravenous contrast. CONTRAST:  12m GADAVIST GADOBUTROL 1 MMOL/ML IV SOLN COMPARISON:  Same-day CT abdomen pelvis FINDINGS: Lower chest: Cardiomegaly. Small bilateral pleural effusions and associated atelectasis or consolidation. Hepatobiliary: Hepatomegaly, maximum coronal span 24.4 cm. Probable hepatic steatosis, however due to technical error only in phase images are submitted for review. Large, rim enhancing, internally hypoenhancing liver mass centered in the anterior left lobe of the liver, hepatic segments IVA and IVB, measuring 7.8 x 7.2 cm (series 13, image 31). No gallstones, gallbladder wall thickening, or biliary dilatation. Pancreas: Diffuse inflammatory fat stranding  and fluid about the pancreas and adjacent retroperitoneum. Multiple, lobulated, rim enhancing fluid signal lesions throughout the pancreatic head and neck, largest centrally measuring 3.6 x 2.8 cm (series 6, image 27). Pancreatic ductal dilatation. Spleen: Splenomegaly, maximum coronal span 14.5 cm. Adrenals/Urinary Tract: Adrenal glands are unremarkable. Kidneys are normal, without renal calculi, solid lesion, or hydronephrosis. Stomach/Bowel: Rim enhancing, internally hypoenhancing mass or lymph node conglomerate centered about the gastroesophageal junction, measuring 5.0 x 3.9 cm (series 13, image 29). No evidence of bowel wall thickening, distention, or inflammatory changes. Vascular/Lymphatic: Aortic atherosclerosis. Rim enhancing, internally hypoenhancing gastrohepatic ligament lymph node measuring up to 2.7 x 2.5 cm (series 12, image 40). Enlarged heterogeneously enhancing retroperitoneal lymph nodes measuring up to 2.3 x 1.6 cm (series 12, image 74). Other: No abdominal wall hernia. Anasarca small perihepatic and perisplenic ascites. Musculoskeletal: No acute or significant osseous findings. IMPRESSION: 1. Rim enhancing, likely necrotic mass or lymph node conglomerate centered about the gastroesophageal junction, measuring 5.0 x 3.9 cm. 2. Large, rim enhancing, likely necrotic liver mass centered in the anterior left lobe of the liver, measuring 7.8 x 7.2 cm. 3. Enlarged gastrohepatic ligament and retroperitoneal lymph nodes. 4. Constellation of findings is most consistent with primary esophageal malignancy and associated metastatic disease. 5. Diffuse inflammatory fat stranding and fluid about the pancreas and adjacent retroperitoneum, with multiple, lobulated, rim enhancing fluid signal lesions throughout the pancreatic head and neck, largest centrally measuring 3.6 x 2.8 cm. Findings are most consistent with acute pancreatitis complicated by acute pancreatic fluid collections, the character and fluid  composition of these pancreatic lesions distinct from esophageal and liver masses. 6. Small bilateral pleural effusions and associated atelectasis or consolidation. 7. Anasarca. Electronically Signed   By: ADelanna AhmadiM.D.   On: 02/11/2022 19:29   DG Chest 1 View  Result Date: 02/11/2022 CLINICAL DATA:  Status post thoracentesis EXAM: CHEST  1 VIEW COMPARISON:  02/11/2022 FINDINGS: Trace left pleural effusion. No focal consolidation. No pneumothorax. No right pleural effusion. Stable cardiomegaly. No  acute osseous abnormality. IMPRESSION: 1. Trace left pleural effusion. No pneumothorax. Electronically Signed   By: Kathreen Devoid M.D.   On: 02/11/2022 10:33   CT ABDOMEN PELVIS W CONTRAST  Result Date: 02/11/2022 CLINICAL DATA:  Liver mass on CT chest. Evaluate for metastatic disease. * Tracking Code: BO * EXAM: CT ABDOMEN AND PELVIS WITH CONTRAST TECHNIQUE: Multidetector CT imaging of the abdomen and pelvis was performed using the standard protocol following bolus administration of intravenous contrast. RADIATION DOSE REDUCTION: This exam was performed according to the departmental dose-optimization program which includes automated exposure control, adjustment of the mA and/or kV according to patient size and/or use of iterative reconstruction technique. CONTRAST:  42m OMNIPAQUE IOHEXOL 300 MG/ML  SOLN COMPARISON:  Chest CTA earlier same day FINDINGS: Lower chest: Bibasilar collapse/consolidation with small pleural effusions, left greater than right. Hepatobiliary: 8.7 x 7.9 x 7.4 cm ill-defined irregular mass is identified in the medial segment left liver. This appears to be isolated with no other suspicious mass lesion evident within the hepatic parenchyma. Gallbladder is nondistended with apparent trace amount of gas in the gallbladder lumen (see axial 35/3). No substantial intrahepatic biliary duct dilatation with extrahepatic common bile duct not well seen in the head of the pancreas but measuring  approximately 6 mm diameter just proximal to the ampulla. Pancreas: There is diffuse peripancreatic edema with ill-defined parenchyma in the head of the pancreas including a 3.0 x 2.9 cm hypoenhancing ill-defined focal lesion visible on 41/3. No substantial main duct dilatation evident. Spleen: No splenomegaly. No focal mass lesion. Adrenals/Urinary Tract: No adrenal nodule or mass. Kidneys unremarkable. No evidence for hydroureter. The urinary bladder appears normal for the degree of distention. Stomach/Bowel: Stomach is unremarkable. No gastric wall thickening. No evidence of outlet obstruction. Duodenum is normally positioned as is the ligament of Treitz. No small bowel wall thickening. No small bowel dilatation. The terminal ileum is normal. The appendix is normal. Diverticuli are seen scattered along the entire length of the colon without CT findings of diverticulitis. Vascular/Lymphatic: There is moderate atherosclerotic calcification of the abdominal aorta without aneurysm. 2.4 cm necrotic lymph node identified in the gastrohepatic ligament on image 30/3. 4.5 x 3.4 cm necrotic mass lesion is identified just cranial to the esophagogastric junction on image 22/3. 16 mm short axis portal caval lymph node is seen on 30/7/3. Calcified para-aortic nodal tissue is seen in the retroperitoneal space. Mild external iliac lymphadenopathy seen bilaterally (right on 75/3 and left on 76/3) nodular irregular peritoneal thickening noted in the lower abdomen and pelvis (see left pelvis on 73/3). Reproductive: The prostate gland and seminal vesicles are unremarkable. Other: Trace free fluid is seen in the pelvis. Small volume free fluid seen adjacent to the liver and spleen tracking into the paracolic gutter bilaterally. Musculoskeletal: Minimal presacral edema evident. No worrisome lytic or sclerotic osseous abnormality. Status post L5-S1 fusion IMPRESSION: 1. 8.7 x 7.9 x 7.4 cm ill-defined irregular mass in the medial  segment left liver. Given findings below, metastatic disease is favored although primary hepatic neoplasm is not entirely excluded. 2. Diffuse peripancreatic edema with ill-defined parenchyma in the head of the pancreas including a 3.0 x 2.9 cm hypoenhancing ill-defined focal pancreatic head lesion. Imaging features are considered suspicious for primary pancreatic adenocarcinoma. MRI abdomen with and without contrast may prove helpful to further evaluate. Endoscopic ultrasound likely prove helpful. 3. 4.5 x 3.4 cm necrotic mass lesion is identified just cranial to the esophagogastric junction with additional metastatic lymphadenopathy in the abdomen and  pelvis. 4. Irregular nodular peritoneal thickening in the lower abdomen and pelvis, concerning for metastatic disease. 5. Bibasilar collapse/consolidation with small pleural effusions, left greater than right. 6. Trace amount of gas in the gallbladder lumen, presumably related to prior sphincterotomy. In the absence of prior sphincterotomy, ascending biliary infection would be a consideration. 7. Aortic Atherosclerosis (ICD10-I70.0). Electronically Signed   By: Misty Stanley M.D.   On: 02/11/2022 07:52   CT Angio Chest Pulmonary Embolism (PE) W or WO Contrast  Result Date: 02/11/2022 CLINICAL DATA:  Recent PE. Hypoxia this morning. Clinical concern for recurrent pulmonary embolus. EXAM: CT ANGIOGRAPHY CHEST WITH CONTRAST TECHNIQUE: Multidetector CT imaging of the chest was performed using the standard protocol during bolus administration of intravenous contrast. Multiplanar CT image reconstructions and MIPs were obtained to evaluate the vascular anatomy. RADIATION DOSE REDUCTION: This exam was performed according to the departmental dose-optimization program which includes automated exposure control, adjustment of the mA and/or kV according to patient size and/or use of iterative reconstruction technique. CONTRAST:  180m OMNIPAQUE IOHEXOL 350 MG/ML SOLN  COMPARISON:  01/31/2022 FINDINGS: Cardiovascular: Heart is enlarged. Mild atherosclerotic calcification is noted in the wall of the thoracic aorta. Left lower lobe segmental pulmonary embolus is stable in appearance (see image 155/series 7. The tiny segmental pulmonary embolus of the right upper lobe is also not appreciably changed in the interval (142/7). No new filling defect within the opacified pulmonary arteries to suggest the presence of additional acute pulmonary embolus on today's study. Mediastinum/Nodes: No mediastinal lymphadenopathy. There is no hilar lymphadenopathy. The esophagus has normal imaging features. There is no axillary lymphadenopathy. Lungs/Pleura: Patchy ground-glass opacities with superimposed nodular component seen previously in both upper lobes have resolved in the interval. There is a persistent 5 mm cavitary nodule in the anterior left upper lobe (76/6) bibasilar collapse/consolidation noted left greater than right. Small to moderate left and small right pleural effusions are progressive in the interval. Upper Abdomen: The liver shows diffusely decreased attenuation suggesting fat deposition. Subtle nodularity of liver contour raises the question of cirrhosis. 7.8 x 8.5 cm subtle hypoattenuating mass lesion is identified in segment IV, similar to prior. Small volume free fluid is seen adjacent to the liver and spleen. Musculoskeletal: No worrisome lytic or sclerotic osseous abnormality. Multiple bilateral anterior rib fractures again noted in this patient with a reported history of recent CPR. Review of the MIP images confirms the above findings. IMPRESSION: 1. Similar appearance nonocclusive segmental and subsegmental pulmonary embolus to the left lower and right upper lobes. No new acute pulmonary embolus. 2. Interval resolution of the bilateral ground-glass opacities with superimposed nodular component seen previously in both upper lobes. 3. Persistent 5 mm cavitary nodule in the  anterior left upper lobe. Follow-up CT chest in 3 months recommended to ensure stability. 4. Bibasilar collapse/consolidation left greater than right. 5. Small to moderate left and small right pleural effusions are progressive in the interval. 6. 7.8 x 8.5 cm subtle hypoattenuating mass lesion in segment IV of the liver, similar to prior but new since 06/26/2020. This could be metastatic disease or a primary liver mass. Consider dedicated abdomen/pelvis CT with oral and intravenous contrast to further evaluate. 7. Multiple bilateral anterior rib fractures again noted in this patient with a reported history of recent CPR. 8. Small volume free fluid adjacent to the liver and spleen. 9. Hepatic steatosis. Subtle nodularity of liver contour raises the question of cirrhosis. 10. Aortic Atherosclerosis (ICD10-I70.0). Critical Value/emergent results were called by telephone at the  time of interpretation on 02/11/2022 at 5:34 am to provider [facility] , who verbally acknowledged these results. Electronically Signed   By: Misty Stanley M.D.   On: 02/11/2022 05:35   DG Chest Port 1 View  Result Date: 02/11/2022 CLINICAL DATA:  Shortness of breath. EXAM: PORTABLE CHEST 1 VIEW COMPARISON:  February 01, 2022 FINDINGS: The endotracheal tube, nasogastric tube and left-sided venous catheter seen on the prior study have been removed. The cardiac silhouette is enlarged and predominant stable, given differences in technique. There is marked severity calcification of the aortic arch. Marked severity atelectasis and/or infiltrate is seen within the retrocardiac region of the left lung base. A small left pleural effusion is also noted. No pneumothorax is identified. The visualized skeletal structures are unremarkable. IMPRESSION: 1. Marked severity left basilar atelectasis and/or infiltrate. 2. Small left pleural effusion. Electronically Signed   By: Virgina Norfolk M.D.   On: 02/11/2022 02:39   CARDIAC CATHETERIZATION  Result  Date: 02/06/2022   Dist Cx lesion is 30% stenosed.   Prox RCA lesion is 100% stenosed.   LV end diastolic pressure is normal. Single vessel occlusive CAD. Occlusion of proximal nondominant RCA which is new since 2018. Normal LVEDP Plan: medical therapy   ECHOCARDIOGRAM LIMITED  Result Date: 02/01/2022    ECHOCARDIOGRAM LIMITED REPORT   Patient Name:   GEOVANNI RAHMING Date of Exam: 02/01/2022 Medical Rec #:  [id number]     Height:       68.0 in Accession #:    [id number]    Weight:       162.7 lb Date of Birth:  May 03, 1961    BSA:          1.872 m Patient Age:    89 years      BP:           110/87 mmHg Patient Gender: M             HR:           110 bpm. Exam Location:  Inpatient Procedure: Limited Echo, Cardiac Doppler, Color Doppler and Intracardiac            Opacification Agent Indications:    Cardiac arrest  History:        Patient has prior history of Echocardiogram examinations, most                 recent 01/31/2022. PAD; Risk Factors:Hypertension and Current                 Smoker.  Sonographer:    Maudry Mayhew MHA, RDMS, RVT, RDCS Referring Phys: [id number] Candee Furbish  Sonographer Comments: Image acquisition challenging due to respiratory motion. IMPRESSIONS  1. Left ventricular ejection fraction, by estimation, is 60 to 65%. The left ventricle has normal function. Left ventricular diastolic parameters are indeterminate. There is the interventricular septum is flattened in systole, consistent with right ventricular pressure overload.  2. The Right ventricle is severly dilated with hypokinesis in the basal and mid portion but hyperkinesis in the apical portion - consistent with McConnell's sign which was also present in the TTE performed 01/31/22. Right ventricular systolic function is moderately reduced. The right ventricular size is severely enlarged. There is normal pulmonary artery systolic pressure.  3. The mitral valve is grossly normal. No evidence of mitral valve regurgitation. No evidence of  mitral stenosis.  4. The aortic valve is grossly normal. Aortic valve regurgitation is trivial. Aortic valve sclerosis is present, with  no evidence of aortic valve stenosis.  5. The inferior vena cava is normal in size with greater than 50% respiratory variability, suggesting right atrial pressure of 3 mmHg. FINDINGS  Left Ventricle: Left ventricular ejection fraction, by estimation, is 60 to 65%. The left ventricle has normal function. Definity contrast agent was given IV to delineate the left ventricular endocardial borders. The left ventricular internal cavity size was small. The interventricular septum is flattened in systole, consistent with right ventricular pressure overload. Left ventricular diastolic parameters are indeterminate.  LV Wall Scoring: The apical lateral segment, apical anterior segment, apical inferior segment, and apex are akinetic. The anterior wall, antero-lateral wall, mid and distal anterior septum, inferior septum, inferior wall, and mid inferolateral segment are normal. Right Ventricle: The Right ventricle is severly dilated with hypokinesis in the basal and mid portion but hyperkinesis in the apical portion - consistent with McConnell's sign which was also present in the TTE performed 01/31/22. The right ventricular size  is severely enlarged. Right ventricular systolic function is moderately reduced. There is normal pulmonary artery systolic pressure. The tricuspid regurgitant velocity is 2.43 m/s, and with an assumed right atrial pressure of 3 mmHg, the estimated right  ventricular systolic pressure is 16.1 mmHg. Left Atrium: Left atrial size was not assessed. Right Atrium: Right atrial size was not assessed. Pericardium: Presence of epicardial fat layer. Mitral Valve: The mitral valve is grossly normal. No evidence of mitral valve stenosis. Tricuspid Valve: The tricuspid valve is grossly normal. Tricuspid valve regurgitation is not demonstrated. No evidence of tricuspid stenosis.  Aortic Valve: The aortic valve is grossly normal. Aortic valve regurgitation is trivial. Aortic valve sclerosis is present, with no evidence of aortic valve stenosis. Pulmonic Valve: The pulmonic valve was not well visualized. Aorta: The aortic root is normal in size and structure and the ascending aorta was not well visualized. Venous: The inferior vena cava is normal in size with greater than 50% respiratory variability, suggesting right atrial pressure of 3 mmHg. LEFT VENTRICLE PLAX 2D LVIDd:         4.70 cm Diastology LVIDs:         3.00 cm LV e' medial:    12.20 cm/s LV PW:         1.10 cm LV E/e' medial:  4.0 LV IVS:        0.90 cm LV e' lateral:   10.70 cm/s                        LV E/e' lateral: 4.6  RIGHT VENTRICLE RV S prime:     9.14 cm/s LEFT ATRIUM         Index LA diam:    3.00 cm 1.60 cm/m   AORTA Ao Root diam: 3.60 cm MITRAL VALVE               TRICUSPID VALVE MV Area (PHT): 3.60 cm    TR Peak grad:   23.6 mmHg MV Decel Time: 211 msec    TR Vmax:        243.00 cm/s MV E velocity: 49.30 cm/s MV A velocity: 71.30 cm/s MV E/A ratio:  0.69 Kardie Tobb DO Electronically signed by Berniece Salines DO Signature Date/Time: 02/01/2022/11:59:14 AM    Final    EEG adult  Result Date: 02/01/2022 Lora Havens, MD     02/01/2022  8:34 AM Patient Name: TUPAC JEFFUS MRN: [id number] Epilepsy Attending: Lora Havens Referring Physician/Provider: Salvadore Dom  E, NP Date: 01/31/2022 Duration: 30.34 mins Patient history: 61 year old male status postcardiac arrest.  EEG to evaluate for seizure. Level of alertness: comatose AEDs during EEG study: Propofol Technical aspects: This EEG study was done with scalp electrodes positioned according to the 10-20 International system of electrode placement. Electrical activity was reviewed with band pass filter of 1-'70Hz'$ , sensitivity of 7 uV/mm, display speed of 77m/sec with a '60Hz'$  notched filter applied as appropriate. EEG data were recorded continuously and digitally stored.   Video monitoring was available and reviewed as appropriate. Description: EEG showed continuous generalized 2 to 3 Hz With overriding drop to 40 Hz beta activity.  Intermittent episodes of subtle head jerks were noted without concomitant EEG change.  Hyperventilation and photic stimulation were not performed.   ABNORMALITY - Continuous slow, generalized IMPRESSION: This study is suggestive of severe diffuse encephalopathy, nonspecific etiology.  Intermittent episodes of head jerk were noted without concomitant EEG change or most likely not epileptic.  No seizures or epileptiform discharges were seen throughout the recording. PLora Havens  DG CHEST PORT 1 VIEW  Result Date: 02/01/2022 CLINICAL DATA:  61year old male central line placement. Pulmonary emboli. EXAM: PORTABLE CHEST 1 VIEW COMPARISON:  Portable chest 01/31/2022 and earlier. FINDINGS: Portable AP semi upright view at 0622 hours. Endotracheal tube and visible enteric tube appears stable. New left IJ approach central line in place. Catheter tip at or just below the carina (SVC level. No pneumothorax. Mediastinal contours are stable and within normal limits. Allowing for portable technique the lungs are now clear, and bilateral ventilation appears substantially improved from the chest CTA yesterday. IMPRESSION: 1. New left IJ approach central line with tip at the SVC level, no adverse features. 2. Otherwise stable lines and tubes. 3. Improved bilateral ventilation since the CTA yesterday. No new pulmonary abnormality. Electronically Signed   By: HGenevie AnnM.D.   On: 02/01/2022 06:33   [place]KG SITE RITE  Result Date: 01/31/2022 If Site Rite image not attached, placement could not be confirmed due to current cardiac rhythm.  DG CHEST PORT 1 VIEW  Result Date: 01/31/2022 CLINICAL DATA:  Pulmonary embolus, intubated EXAM: PORTABLE CHEST 1 VIEW COMPARISON:  823 FINDINGS: Single frontal view of the chest demonstrates endotracheal tube overlying tracheal  air column tip well above carina. Enteric catheter passes below diaphragm tip excluded by collimation. The cardiac silhouette is stable. Improved aeration of the left lung since prior study. No focal consolidation, effusion, or pneumothorax. No acute bony abnormality. IMPRESSION: 1. Support devices as above. 2. Improved aeration of the lungs, with resolution of the left-sided airspace disease seen previously. Electronically Signed   By: MRanda NgoM.D.   On: 01/31/2022 22:13   ECHOCARDIOGRAM COMPLETE  Result Date: 01/31/2022    ECHOCARDIOGRAM REPORT   Patient Name:   JCARSTEN CARSTARPHENDate of Exam: 01/31/2022 Medical Rec #:  [id number]    Height:       68.0 in Accession #:    [id number]   Weight:       178.0 lb Date of Birth:  111/30/1962   BSA:          1.945 m Patient Age:    89years      BP:           130/94 mmHg Patient Gender: M             HR:           100 bpm. Exam  Location:  Inpatient Procedure: 2D Echo, Cardiac Doppler and Color Doppler Indications:    Abnormal ECG R94.31  History:        Patient has prior history of Echocardiogram examinations, most                 recent 02/22/2019. PVD, AV Mass, Signs/Symptoms:Murmur; Risk                 Factors:Hypertension. Lupus, Anemia.  Sonographer:    Eartha Inch Referring Phys: [place] Comments: Echo performed with patient supine and on artificial respirator. Image acquisition challenging due to patient body habitus and Image acquisition challenging due to respiratory motion. IMPRESSIONS  1. Left ventricular ejection fraction, by estimation, is 60 to 65%. The left ventricle has normal function. The left ventricle has no regional wall motion abnormalities. Indeterminate diastolic filling due to E-A fusion. There is the interventricular septum is flattened in systole, consistent with right ventricular pressure overload.  2. Right ventricular systolic function is severely reduced. The right ventricular size is mildly enlarged.  Tricuspid regurgitation signal is inadequate for assessing PA pressure.  3. The mitral valve is normal in structure. No evidence of mitral valve regurgitation.  4. The aortic valve was not well visualized. Aortic valve regurgitation is not visualized. Aortic valve sclerosis is present, with no evidence of aortic valve stenosis. Comparison(s): Prior images reviewed side by side. The right ventricular hypertrophy is significantly worse. Conclusion(s)/Recommendation(s): Discussed findings with primary team. FINDINGS  Left Ventricle: Left ventricular ejection fraction, by estimation, is 60 to 65%. The left ventricle has normal function. The left ventricle has no regional wall motion abnormalities. The left ventricular internal cavity size was normal in size. There is  no left ventricular hypertrophy. The interventricular septum is flattened in systole, consistent with right ventricular pressure overload. Indeterminate diastolic filling due to E-A fusion. Right Ventricle: McConnell's sign is present, strongly correlated with acute cor pulmonale, most likely large pulmonary embolism. The right ventricular size is mildly enlarged. No increase in right ventricular wall thickness. Right ventricular systolic function is severely reduced. Tricuspid regurgitation signal is inadequate for assessing PA pressure. Left Atrium: Left atrial size was normal in size. Right Atrium: Right atrial size was normal in size. Pericardium: There is no evidence of pericardial effusion. Mitral Valve: The mitral valve is normal in structure. No evidence of mitral valve regurgitation. Tricuspid Valve: The tricuspid valve is normal in structure. Tricuspid valve regurgitation is not demonstrated. Aortic Valve: The aortic valve was not well visualized. Aortic valve regurgitation is not visualized. Aortic valve sclerosis is present, with no evidence of aortic valve stenosis. Pulmonic Valve: The pulmonic valve was not well visualized. Aorta: The aortic  root is normal in size and structure. Venous: IVC assessment for right atrial pressure unable to be performed due to mechanical ventilation. IAS/Shunts: No atrial level shunt detected by color flow Doppler. Sanda Klein MD Electronically signed by Sanda Klein MD Signature Date/Time: 01/31/2022/4:17:16 PM    Final    CT Angio Chest PE W and/or Wo Contrast  Result Date: 01/31/2022 CLINICAL DATA:  Pulmonary embolism (PE) suspected, high prob EXAM: CT ANGIOGRAPHY CHEST WITH CONTRAST TECHNIQUE: Multidetector CT imaging of the chest was performed using the standard protocol during bolus administration of intravenous contrast. Multiplanar CT image reconstructions and MIPs were obtained to evaluate the vascular anatomy. RADIATION DOSE REDUCTION: This exam was performed according to the departmental dose-optimization program which includes automated exposure control, adjustment of the mA and/or kV  according to patient size and/or use of iterative reconstruction technique. CONTRAST:  128m OMNIPAQUE IOHEXOL 350 MG/ML SOLN COMPARISON:  CTA 06/26/2020. FINDINGS: Cardiovascular: Satisfactory opacification of the pulmonary arteries to the segmental level. There is no acute pulmonary embolism at the branching of the left main pulmonary artery into the segmental lower lobe arteries (series 5, images 65-67, series 8, image [id number]). Additional small filling defect and a right upper lobe segmental artery (series 5, image 57). No central/saddle PE.Cardiomegaly. RV: LV ratio is 0.9. Trace pericardial effusion. Atherosclerosis of the thoracic aorta. Mediastinum/Nodes: No lymphadenopathy. The thyroid is unremarkable. Esophagus is unremarkable. Lungs/Pleura: There are confluent ground-glass opacities in the upper lungs bilaterally with areas of nodular consolidation and interlobular septal thickening. Bibasilar atelectasis. No pleural effusion. No pneumothorax. Mid to Upper Abdomen: No acute abnormality. Musculoskeletal: There are  multiple acute anterior rib fractures, involving ribs 2 through 7 bilaterally. Most of these are angulated and nondisplaced. Fractures involving ribs 3 through 5 on the right are mildly displaced. Nondisplaced mid sternal fracture. Review of the MIP images confirms the above findings. IMPRESSION: Acute segmental pulmonary emboli in the left lower and right upper lobes. No definite CT evidence of right heart strain. Complex ground-glass opacities in the mid to upper lungs bilaterally with areas of nodular consolidation and interlobular septal thickening. Findings are most consistent with contusions related to recent cardiopulmonary resuscitation, versus a multifocal infectious/inflammatory process. Bibasilar atelectasis. Multiple anterior rib fractures involving ribs 2 through 7 bilaterally. Nondisplaced mid sternal fracture. Critical Value/emergent results were called by telephone at the time of interpretation on 01/31/2022 at 2:29 pm to provider SSherwood Gambler, who verbally acknowledged these results. Electronically Signed   By: JMaurine SimmeringM.D.   On: 01/31/2022 14:36   CT Head Wo Contrast  Result Date: 01/31/2022 CLINICAL DATA:  Neuro deficit, acute, stroke suspected EXAM: CT HEAD WITHOUT CONTRAST TECHNIQUE: Contiguous axial images were obtained from the base of the skull through the vertex without intravenous contrast. RADIATION DOSE REDUCTION: This exam was performed according to the departmental dose-optimization program which includes automated exposure control, adjustment of the mA and/or kV according to patient size and/or use of iterative reconstruction technique. COMPARISON:  12/08/2015 FINDINGS: Brain: Late subacute to chronic appearing infarct within the inferior left occipital lobe. Otherwise, no evidence of acute infarction. No hemorrhage, hydrocephalus, extra-axial collection, or mass lesion. Scattered low-density changes within the periventricular and subcortical white matter compatible with  chronic microvascular ischemic change. Vascular: Atherosclerotic calcifications involving the large vessels of the skull base. No unexpected hyperdense vessel. Skull: Normal. Negative for fracture or focal lesion. Sinuses/Orbits: Small retention cyst or polyp in the right maxillary sinus. Other: None. IMPRESSION: 1. Late subacute to chronic appearing infarct within the inferior left occipital lobe. Follow-up MRI can be performed to more accurately characterize. 2. Otherwise, no acute intracranial abnormality. Electronically Signed   By: NDavina PokeD.O.   On: 01/31/2022 14:25   DG Chest Port 1 View  Result Date: 01/31/2022 CLINICAL DATA:  Post CPR EXAM: PORTABLE CHEST 1 VIEW COMPARISON:  Chest x-ray 06/26/2020 FINDINGS: Endotracheal tube in good position. NG tube in the stomach with the tip at the GE junction. Atherosclerotic calcification aortic arch. Right lung is clear. Mild left upper lobe and left lower lobe airspace disease. No left effusion. No acute skeletal abnormality IMPRESSION: Endotracheal tube in good position. NG tip in this stomach with the side hole in the distal esophagus. Mild airspace disease left upper lobe and left lower lobe. Possible atelectasis  or aspiration. Electronically Signed   By: Franchot Gallo M.D.   On: 01/31/2022 12:50    Assessment and Plan:  1.  Liver mass, mass or lymph node conglomerate near the GE junction, gastrohepatic ligament and retroperitoneal lymphadenopathy. -02/11/2022 CTA chest-7.8 x 8.5 cm subtle hypoattenuating mass lesion in segment IV of the liver. -02/11/2022 CT abdomen/pelvis-8.7 x 7.8 or ill-defined irregular mass in the medial segment left liver, 4.5 x 3.4 cm necrotic mass lesion just cranial to the esophagogastric junction with metastatic lymphadenopathy in the abdomen and pelvis, irregular nodular peritoneal thickening in the lower abdomen and pelvis -02/11/2022 MRI abdomen-rim-enhancing likely necrotic mass or lymph node conglomerate centered  around the gastroesophageal junction measuring 5.0 x 3.9 cm, large rim-enhancing likely necrotic liver mass centered in the anterior left lobe of the liver measuring 7.8 x 7.2 cm, enlarged gastrohepatic ligament and retroperitoneal lymph nodes -Labs from 02/11/2022-AFP 2.4, CA 19.9 88, CEA 287 -Ultrasound-guided liver biopsy performed 02/15/2022-results pending 2.  Myocardial infarction August 2023 3.  Microcytic anemia -Labs from 02/11/2022-ferritin 151, iron 13, percent saturation 5% 4.  DVT/pulmonary embolism-bilateral lower extremity DVTs 01/10/2022, pulmonary emboli on chest CT 01/31/2022 5.  PAD status post AKA in 2021 6.  RA 7.  Bilateral pleural effusions -Ultrasound-guided thoracentesis performed on 02/11/2022.  Cytology negative for malignancy.  [name] [name] was found to have abnormal imaging results concerning for malignancy.  A liver biopsy is currently pending.  Based on imaging results, this could represent an esophageal cancer versus gastric cancer.  Recommend GI eval to evaluate primary source of malignancy.  Given recent MI, GI l does not recommend performing an upper endoscopy at this time.  We will await the liver biopsy results and then have further discussion with the patient regarding treatment options.   Thank you for this referral.   Mikey Bussing, DNP, AGPCNP-BC, AOCNP   [name] [name] was interviewed and examined.  I reviewed the imaging studies and medical record.  He has been admitted twice over the past month, the first time following a cardiac arrest and now with increased dyspnea.  He was diagnosed with lower extremity DVTs in July and a pulmonary embolism in August.  Is unclear whether the cardiac arrest was related to pulmonary embolism or ischemic heart disease.  He is maintained on apixaban anticoagulation.  I suspect he has a hypercoagulation syndrome related to metastatic carcinoma.  The clinical presentation and imaging are most suggestive of an upper gastrointestinal  primary such as gastroesophageal cancer, but the differential diagnosis includes pancreas cancer, other gastrointestinal malignancies, and lung cancer.  We will follow-up on the results of the liver biopsy.  Outpatient follow-up will be scheduled at the Cancer center for next week.  We will determine whether he is a candidate for a trial of systemic therapy versus hospice care based on his performance status when he is out of the hospital.  I was present for greater than 50% of today's visit.  I performed medical decision making.

## 2022-02-16 NOTE — Progress Notes (Signed)
NAME:  [name] [name], MRN:  [id number], DOB:  [birth year], LOS: 5 ADMISSION DATE:  02/11/2022  Subjective  Patient evaluated at bedside this AM with his friend Timmothy Sours. Patients primary concern is rib fracture pain. It appears he is on chronic opioid therapy with the pain management clinic and has not been getting his baseline dose in the hospital. He is having minor pain from the liver biopsy yesterday. He says he is feeling swimmy headed from AM gabapentin and doesn't want to take it anymore.  Objective   Blood pressure 134/87, pulse 92, temperature 97.7 F (36.5 C), temperature source Oral, resp. rate (!) 21, height '5\' 7"'$  (1.702 m), weight 79.2 kg, SpO2 96 %.     Intake/Output Summary (Last 24 hours) at 02/16/2022 1305 Last data filed at 02/16/2022 0418 Gross per 24 hour  Intake 829.53 ml  Output 1401 ml  Net -571.47 ml    Filed Weights   02/14/22 0343 02/15/22 0537 02/16/22 0320  Weight: 80.8 kg 79.5 kg 79.2 kg   Physical Exam:grossly unchanged from prior days General: sitting up in the chair, pleasant, no distress, chronically ill-appearing and flushed CV: regular rate and rhythm, no r/m/g, no JVD, pulses 2+ Pulm: Normal work of breathing on RA. Interval improvement in crackles heard diffusely but worse at the bases, no wheezing, good air movement throughout Abdomen: Soft, minimal tenderness around the umbilicus, no RUQ ptp, non-distended. Normoactive bowel sounds. Msk: left aka with prosthesis in place, no R LE, RLE warm and well perfused Skin: warm and dry Neuro: Awake, alert, conversing appropriately. Grossly non-focal  Labs       Latest Ref Rng & Units 02/16/2022    1:30 AM 02/15/2022    4:00 AM 02/14/2022    3:42 AM  CBC  WBC 4.0 - 10.5 K/uL 8.8  8.7  10.8   Hemoglobin 13.0 - 17.0 g/dL 10.3  10.0  9.3   Hematocrit 39.0 - 52.0 % 31.8  31.7  29.0   Platelets 150 - 400 K/uL 303  290  301       Latest Ref Rng & Units 02/15/2022    4:00 AM 02/14/2022    3:42 AM 02/13/2022     9:22 AM  BMP  Glucose 70 - 99 mg/dL 137  114  95   BUN 8 - 23 mg/dL '20  21  18   '$ Creatinine 0.61 - 1.24 mg/dL 0.97  1.30  1.11   Sodium 135 - 145 mmol/L 133  136  135   Potassium 3.5 - 5.1 mmol/L 4.1  3.8  4.0   Chloride 98 - 111 mmol/L 100  102  98   CO2 22 - 32 mmol/L '24  24  26   '$ Calcium 8.9 - 10.3 mg/dL 9.0  8.6  8.6    Summary   Madix Blowe is 61yo person with recent inferior STEMI and PE/DVT, peripheral arterial disease s/p L AKA 2021, rheumatoid arthritis not on DMARD admitted 8/19 with acute hypoxic respiratory failure and found to have incidental finding of multiple intra-abdominal masses c/f primary esophageal malignancy with metastatic disease.  Assessment & Plan:  Principal Problem:   Acute respiratory failure with hypoxemia (HCC) Active Problems:   Rheumatoid arthritis (HCC)   HTN (hypertension)   Coronary artery disease involving native coronary artery of native heart with unstable angina pectoris (HCC)   PAD (peripheral artery disease) (HCC)   DVT (deep venous thrombosis) (HCC)   Pulmonary emboli (HCC)   Multiple fractures of ribs,  left side, initial encounter for closed fracture   Acute respiratory failure with hypoxia (HCC)   Liver mass   Abnormal CT scan, gastrointestinal tract  Malignancy of unknown originExudative L pleural effusion s/p thoracentesis DVT, PE There is high likely hood of malignancy given new GE junction and liver masses, retroperitoneal node enlargement per CT and MRI, and extremely elevated CEA and CA-19-9 tumor markers. Pleural effusion cytology was without malignant cells. Pending pathology report from IR liver biopsy performed 02/16/2022. Seen today by the GI oncology with scheduled outpatient follow up. - Resume eliquis and plavix today, d/c heparin ggt   Rib pain post CPR Patient has persistent rib pain that has been limiting breathing. On RA now with sats around 90-92% while seated. He is on chronic pain meds with percocet 10-325 four  times daily. Will increase pain regimen and have patient work with PT tomorrow to determine need for home O2 at discharge. -Tylenol PO '1000mg'$  TID -Oxycodone PO '15mg'$  q6 hrs -oxy '5mg'$  oral q6hrs prn for breakthrough pain -PT/OT  History of recent inferior STEMI Coronary artery disease No chest pain and no ACS per admission trops and ekg.Holding Plavix in setting of upcoming biopsy. Hemodynamically stable. - Continue home atorvastatin, metoprolol - Holding home amlodipine, chlorthalidone in setting of normotension  Normocytic anemia Hgb stable with slight drop from 10.3 to 9.3 but no melena or hemoptysis or other signs of bleed. Iron studies yesterday revealed ferritin 151, Fe 13, sat 5%. Given normocytic anemia with low iron/saturation and normal ferritin, suspect early anemia of chronic disease in the setting of malignancy. Will continue to monitor.  Acute hypoxic respiratory failureCommunity-acquired pneumonia-resolved  Best practice:  DIET: HH IVF: n/a DVT PPX: heparin BOWEL: n/a CODE: FULL FAM COM: n/a  Iona Coach, MD Internal Medicine Resident PGY-1 PAGER: [phone] 02/16/2022 1:05 PM  If after hours (below), please contact on-call pager: [phone] 5PM-7AM Monday-Friday 1PM-7AM Saturday-Sunday

## 2022-02-16 NOTE — Discharge Summary (Cosign Needed)
Name: [name] [name] MRN: [id number] DOB: [birth year] 61 y.o. PCP: [name] Blonder, DO  Date of Admission: 02/11/2022  1:32 AM Date of Discharge: 02/17/2022 Attending Physician: [name] Pou, MD  Discharge Diagnosis: 1. Principal Problem:   Acute respiratory failure with hypoxemia ([place]), resolved Active Problems:   HTN (hypertension)   Coronary artery disease involving native coronary artery of native heart with unstable angina pectoris ([place]) and recent cardiac arrest    PAD (peripheral artery disease) (HCC)   DVT (deep venous thrombosis) (HCC)   Pulmonary emboli (HCC)   Multiple fractures of ribs, bilateral, and sternal fracture due to CPR trauma (recent hospitalization)   Esophageal mass and liver mass    Discharge Medications: Allergies as of 02/17/2022       Reactions   Dilaudid [hydromorphone Hcl] Rash   Ramipril Rash, Other (See Comments)   Per patient on R arm and leg only; no angioedema   Tramadol Itching, Rash        Medication List     STOP taking these medications    acetaminophen 325 MG tablet Commonly known as: TYLENOL   amLODipine 10 MG tablet Commonly known as: NORVASC   chlorthalidone 25 MG tablet Commonly known as: HYGROTON   furosemide 40 MG tablet Commonly known as: Lasix       TAKE these medications    Apixaban Starter Pack (226m and 54m Commonly known as: ELIQUIS STARTER PACK Take as directed on package: start with two-26m61mablets twice daily for 7 days. On day 8, switch to one-26mg626mblet twice daily.   atorvastatin 80 MG tablet Commonly known as: LIPITOR Take 1 tablet (80 mg total) by mouth daily at 6 PM.   clopidogrel 75 MG tablet Commonly known as: Plavix Take 1 tablet (75 mg total) by mouth daily.   diclofenac Sodium 1 % Gel Commonly known as: VOLTAREN Apply 2-4 g topically in the morning, at noon, in the evening, and at bedtime.   docusate sodium 100 MG capsule Commonly known as: COLACE Take 1 capsule (100 mg  total) by mouth daily.   DULoxetine 30 MG capsule Commonly known as: CYMBALTA Take 30 mg by mouth daily.   gabapentin 100 MG capsule Commonly known as: NEURONTIN Take 100 mg by mouth at bedtime as needed (for pain).   gabapentin 300 MG capsule Commonly known as: NEURONTIN Take 300 mg by mouth 2 (two) times daily.   lidocaine 5 % Commonly known as: LIDODERM Place 1 patch onto the skin daily. Remove & Discard patch within 12 hours or as directed by MD For chest wall pain   metoprolol tartrate 25 MG tablet Commonly known as: LOPRESSOR Take 0.5 tablets (12.5 mg total) by mouth 2 (two) times daily.   naloxone 2 MG/2ML injection Commonly known as: NARCAN Inject 2 mg into the muscle once as needed (opiod overdose (call 911, inject intramuscularly in shoulder or thigh. Repeat every 3 minutes)).   naloxone 4 MG/0.1ML Liqd nasal spray kit Commonly known as: NARCAN Place 1 spray into the nose as needed.   nicotine 21 mg/24hr patch Commonly known as: NICODERM CQ - dosed in mg/24 hours Place 21 mg onto the skin daily. Leave on for 7 days.   oxyCODONE-acetaminophen 10-325 MG tablet Commonly known as: PERCOCET Take 1 tablet by mouth every 6 (six) hours as needed for severe pain. What changed: Another medication with the same name was added. Make sure you understand how and when to take each.   oxyCODONE-acetaminophen 5-325 MG tablet  Commonly known as: Percocet Take 1 tablet by mouth every 6 (six) hours for 4 days. What changed: You were already taking a medication with the same name, and this prescription was added. Make sure you understand how and when to take each.   varenicline 1 MG tablet Commonly known as: CHANTIX TAKE 1 TABLET BY MOUTH TWICE A DAY        Disposition and follow-up:   [name] was discharged from [facility] in Stable condition.  At the hospital follow up visit please address:  HTN -holding home chlorthalidone and amlodipine at  discharge do to relative hypotension, please restart as tolerated  Liver Biopsy -F/u pathology report  Bilateral anterior rib fractures mid-sternal fracture -Discharged with additional percocet 5-347m once ever 6 hrs for 3 days for acute rib pain -Will need additional evaluation for higher pain medication needs with his pain specialist on Monday 02/20/22  Anemia of chronic disease vs Iron deficiency anemia -Repeat CBC and iron studies as clinically indicated  2.  Labs / imaging needed at time of follow-up: CBC  3.  Pending labs/ test needing follow-up: Liver biopsy surgical pathology  Follow-up Appointments:  Upon discharge from the hospital, hematology/oncology's post discharge plan of care for the outpatient setting is: Wednesday, February 22, 2022 [facility] 1[facility] [place] [id number]: [name] [name] a 61year old male with past medical history of rheumatoid arthritis, PAD status post left AKA 2021, CAD, PE and DVT on DOAC, recent cardiac arrest from inferior STEMI 8/8 who presented to the ED for shortness of breath 8/19 following discharge 8/16.  Malignancy of unknown originExudative L pleural effusion s/p thoracentesis DVT(R popliteal/peroneal/posterior tibial), PE Pt presented with respiratory failure and CT chest demonstrated persistent nonocclusive segmental and subsegmental pulmonary embolus to the left lower and right upper lobes,improving bilateral ground glass opacities likely infectious vs post cpr contusion, and hepatic mass. Patient endorses recent night sweats and long standing tobacco use but denies recent weight loss,hemoptysis, dysphagia or alcohol use.Following CT A/P with findings of GE junction mass, hepatic mass, c/f pancreatic mass, MRI 8/19 revealed rim enhancing necrotic masses around GE junction and in the anterior left lobe of the liver along with enlarged  intra-abdominal lymph nodes, evidence of edematous pancreatitis, concerning for metastatic esophageal cancer. The patient was also found to have a L exudative pleural effusion with 0.3L on thoracentesis, which supports esophageal malignancy, although could represent sequelae of recent cpr, CAP PNA, pancreatic inflammation per imaging, chronic PE. Pleural cytology was negative but doesn't rule out malignancy. Tumor markers were significant for AFP wnl, CEA elevated to 287,CA 19-9 elevated to 88. IR biopsied the liver lesion on 8/23 with a technically successful biopsy. Oncology was notified of the patient and scheduled to follow up outpatient. Liver biopsy path pending at discharge.    Acute hypoxic respiratory failureCommunity-acquired pneumonia Exudative L pleural effusion s/p thoracentesis The patient presented with new acute hypoxic respiratory failure after recent admission for STEMI. Repeat CT chest 8/19 showed persistent subsegmental DVT and interval improvement in ground glass opacities found during admission 8/8. BNP was elevated to 300 with evidence of volume overload. Following IV Lasix 20x1 and 40x1 the patient returned to euvolemic on physical exam. Do not suspect this was overt CHF exacerbation.The patient does report 3-4 days of cough and sputum production that resolved with antibiotics: IV ceftriaxone 8/19-8/22, IV azithromycin 8/19-8/20,Oral Azithrmycin 8/21-8/22. He also had  a 5 day course of IV unasyn during his admission on 8/8.WBC was at 13.6 on admission and at 8.8 on discharge. Without differential it is unclear if this was elevated due to left shift or demargination from stress given concerning presentation for malignancy or malignancy itself. There is also concern that the effusion was causing respiratory compromise, which had thoracentesis of 0.3L and was found to be exudative without malignant cells.  The patient has a mid sternal fracture and bilateral anterior ribs 2-7 fracture  post CPR. He was weaned to 2L O2 at discharge. The patient ultimately had a multifactorial respiratory compromise likely driven by rib fractures primarily.  Bilateral 2-7 anterior rib fractures Mid sternal fracture Pain treated with scheduled percocet and prn oxycodone. Discharged with appropriate pain regimen and home 02.  He is advised to use a pillow held against his chest when coughing or deep breathing to help support his chest.   History of recent inferior STEMI Coronary artery disease No chest pain and no ACS per admission trops and ekg.Continued home atorvastatin, metoprolol. Held home amlodipine, chlorthalidone in setting of normotension.   Normocytic anemia Hgb at discharge was 10.3 with a decreased MCV to 79.3 and an increased RDW. Iron studies revealed ferritin 151, Fe 13, sat 5%. This may reflect iron deficiency anemia with a normalized ferritin given a false elevation as an acute phase reactant.  Pulmonary Nodule Persistent 5 mm cavitary nodule in the anterior left upper lobe. Follow-up CT chest in 3 months recommended to ensure stability is a general recommendation, though he is dealing with a more urgent malignancy at this time.  Discharge Exam:   BP 127/87 (BP Location: Right Arm)   Pulse 96   Temp 97.6 F (36.4 C) (Oral)   Resp 20   Ht 5' 7" (1.702 m)   Wt 78.5 kg   SpO2 93%   BMI 27.11 kg/m  Discharge exam:  Gen: pleasant, laying in bed, chronically ill appearing HEENT: flushed, normocephalic, atraumatic, no scleral icterus CV: RRR, no m/r/g Pulm: Normal work of breathing on RA, diffuse crackles worse at the bilateral bases, good air movement Abd: NT,ND, normoactive bowel sounds, no ptp Skin: warm and dry, no peripheral edema, L AKA  Pertinent Labs, Studies, and Procedures:     Latest Ref Rng & Units 02/16/2022    1:30 AM 02/15/2022    4:00 AM 02/14/2022    3:42 AM  CBC  WBC 4.0 - 10.5 K/uL 8.8  8.7  10.8   Hemoglobin 13.0 - 17.0 g/dL 10.3  10.0  9.3    Hematocrit 39.0 - 52.0 % 31.8  31.7  29.0   Platelets 150 - 400 K/uL 303  290  301        Latest Ref Rng & Units 02/15/2022    4:00 AM 02/14/2022    3:42 AM 02/13/2022    9:22 AM  BMP  Glucose 70 - 99 mg/dL 137  114  95   BUN 8 - 23 mg/dL _0 Creatinine 0.61 - 1.24 mg/dL 0.97  1.30  1.11   Sodium 135 - 145 mmol/L 133  136  135   Potassium 3.5 - 5.1 mmol/L 4.1  3.8  4.0   Chloride 98 - 111 mmol/L 100  102  98   CO2 22 - 32 mmol/L _1 Calcium 8.9 - 10.3 mg/dL 9.0  8.6  8.6    US THORACENTESIS ASP PLEURAL SPACE W/IMG  GUIDE  Result Date: 02/12/2022 INDICATION: Left pleural effusion EXAM: ULTRASOUND GUIDED Left THORACENTESIS MEDICATIONS: 10 cc 1% lidocaine. COMPLICATIONS: None immediate. PROCEDURE: An ultrasound guided thoracentesis was thoroughly discussed with the patient and questions answered. The benefits, risks, alternatives and complications were also discussed. The patient understands and wishes to proceed with the procedure. Written consent was obtained. Ultrasound was performed to localize and mark an adequate pocket of fluid in the left chest. The area was then prepped and draped in the normal sterile fashion. 1% Lidocaine was used for local anesthesia. Under ultrasound guidance a Yueh catheter was introduced. Thoracentesis was performed. The catheter was removed and a dressing applied. FINDINGS: A total of approximately 300 cc of blood tinged fluid was removed. Samples were sent to the laboratory as requested by the clinical team. IMPRESSION: Successful ultrasound guided left thoracentesis yielding 300 cc of pleural fluid. CXR: No PTX Read by [facility] Drafts [facility] Electronically Signed   By: Corrie Mckusick D.O.   On: 02/12/2022 08:35   MR ABDOMEN W WO CONTRAST  Result Date: 02/11/2022 CLINICAL DATA:  Liver, pancreatic, and esophageal masses identified by CT EXAM: MRI ABDOMEN WITHOUT AND WITH CONTRAST TECHNIQUE: Multiplanar multisequence MR imaging of the abdomen was  performed both before and after the administration of intravenous contrast. CONTRAST:  38m GADAVIST GADOBUTROL 1 MMOL/ML IV SOLN COMPARISON:  Same-day CT abdomen pelvis FINDINGS: Lower chest: Cardiomegaly. Small bilateral pleural effusions and associated atelectasis or consolidation. Hepatobiliary: Hepatomegaly, maximum coronal span 24.4 cm. Probable hepatic steatosis, however due to technical error only in phase images are submitted for review. Large, rim enhancing, internally hypoenhancing liver mass centered in the anterior left lobe of the liver, hepatic segments IVA and IVB, measuring 7.8 x 7.2 cm (series 13, image 31). No gallstones, gallbladder wall thickening, or biliary dilatation. Pancreas: Diffuse inflammatory fat stranding and fluid about the pancreas and adjacent retroperitoneum. Multiple, lobulated, rim enhancing fluid signal lesions throughout the pancreatic head and neck, largest centrally measuring 3.6 x 2.8 cm (series 6, image 27). Pancreatic ductal dilatation. Spleen: Splenomegaly, maximum coronal span 14.5 cm. Adrenals/Urinary Tract: Adrenal glands are unremarkable. Kidneys are normal, without renal calculi, solid lesion, or hydronephrosis. Stomach/Bowel: Rim enhancing, internally hypoenhancing mass or lymph node conglomerate centered about the gastroesophageal junction, measuring 5.0 x 3.9 cm (series 13, image 29). No evidence of bowel wall thickening, distention, or inflammatory changes. Vascular/Lymphatic: Aortic atherosclerosis. Rim enhancing, internally hypoenhancing gastrohepatic ligament lymph node measuring up to 2.7 x 2.5 cm (series 12, image 40). Enlarged heterogeneously enhancing retroperitoneal lymph nodes measuring up to 2.3 x 1.6 cm (series 12, image 74). Other: No abdominal wall hernia. Anasarca small perihepatic and perisplenic ascites. Musculoskeletal: No acute or significant osseous findings. IMPRESSION: 1. Rim enhancing, likely necrotic mass or lymph node conglomerate centered  about the gastroesophageal junction, measuring 5.0 x 3.9 cm. 2. Large, rim enhancing, likely necrotic liver mass centered in the anterior left lobe of the liver, measuring 7.8 x 7.2 cm. 3. Enlarged gastrohepatic ligament and retroperitoneal lymph nodes. 4. Constellation of findings is most consistent with primary esophageal malignancy and associated metastatic disease. 5. Diffuse inflammatory fat stranding and fluid about the pancreas and adjacent retroperitoneum, with multiple, lobulated, rim enhancing fluid signal lesions throughout the pancreatic head and neck, largest centrally measuring 3.6 x 2.8 cm. Findings are most consistent with acute pancreatitis complicated by acute pancreatic fluid collections, the character and fluid composition of these pancreatic lesions distinct from esophageal and liver masses. 6. Small bilateral pleural effusions and  associated atelectasis or consolidation. 7. Anasarca. Electronically Signed   By: Delanna Ahmadi M.D.   On: 02/11/2022 19:29   DG Chest 1 View  Result Date: 02/11/2022 CLINICAL DATA:  Status post thoracentesis EXAM: CHEST  1 VIEW COMPARISON:  02/11/2022 FINDINGS: Trace left pleural effusion. No focal consolidation. No pneumothorax. No right pleural effusion. Stable cardiomegaly. No acute osseous abnormality. IMPRESSION: 1. Trace left pleural effusion. No pneumothorax. Electronically Signed   By: Kathreen Devoid M.D.   On: 02/11/2022 10:33   CT ABDOMEN PELVIS W CONTRAST  Result Date: 02/11/2022 CLINICAL DATA:  Liver mass on CT chest. Evaluate for metastatic disease. * Tracking Code: BO * EXAM: CT ABDOMEN AND PELVIS WITH CONTRAST TECHNIQUE: Multidetector CT imaging of the abdomen and pelvis was performed using the standard protocol following bolus administration of intravenous contrast. RADIATION DOSE REDUCTION: This exam was performed according to the departmental dose-optimization program which includes automated exposure control, adjustment of the mA and/or kV  according to patient size and/or use of iterative reconstruction technique. CONTRAST:  88m OMNIPAQUE IOHEXOL 300 MG/ML  SOLN COMPARISON:  Chest CTA earlier same day FINDINGS: Lower chest: Bibasilar collapse/consolidation with small pleural effusions, left greater than right. Hepatobiliary: 8.7 x 7.9 x 7.4 cm ill-defined irregular mass is identified in the medial segment left liver. This appears to be isolated with no other suspicious mass lesion evident within the hepatic parenchyma. Gallbladder is nondistended with apparent trace amount of gas in the gallbladder lumen (see axial 35/3). No substantial intrahepatic biliary duct dilatation with extrahepatic common bile duct not well seen in the head of the pancreas but measuring approximately 6 mm diameter just proximal to the ampulla. Pancreas: There is diffuse peripancreatic edema with ill-defined parenchyma in the head of the pancreas including a 3.0 x 2.9 cm hypoenhancing ill-defined focal lesion visible on 41/3. No substantial main duct dilatation evident. Spleen: No splenomegaly. No focal mass lesion. Adrenals/Urinary Tract: No adrenal nodule or mass. Kidneys unremarkable. No evidence for hydroureter. The urinary bladder appears normal for the degree of distention. Stomach/Bowel: Stomach is unremarkable. No gastric wall thickening. No evidence of outlet obstruction. Duodenum is normally positioned as is the ligament of Treitz. No small bowel wall thickening. No small bowel dilatation. The terminal ileum is normal. The appendix is normal. Diverticuli are seen scattered along the entire length of the colon without CT findings of diverticulitis. Vascular/Lymphatic: There is moderate atherosclerotic calcification of the abdominal aorta without aneurysm. 2.4 cm necrotic lymph node identified in the gastrohepatic ligament on image 30/3. 4.5 x 3.4 cm necrotic mass lesion is identified just cranial to the esophagogastric junction on image 22/3. 16 mm short axis portal  caval lymph node is seen on 30/7/3. Calcified para-aortic nodal tissue is seen in the retroperitoneal space. Mild external iliac lymphadenopathy seen bilaterally (right on 75/3 and left on 76/3) nodular irregular peritoneal thickening noted in the lower abdomen and pelvis (see left pelvis on 73/3). Reproductive: The prostate gland and seminal vesicles are unremarkable. Other: Trace free fluid is seen in the pelvis. Small volume free fluid seen adjacent to the liver and spleen tracking into the paracolic gutter bilaterally. Musculoskeletal: Minimal presacral edema evident. No worrisome lytic or sclerotic osseous abnormality. Status post L5-S1 fusion IMPRESSION: 1. 8.7 x 7.9 x 7.4 cm ill-defined irregular mass in the medial segment left liver. Given findings below, metastatic disease is favored although primary hepatic neoplasm is not entirely excluded. 2. Diffuse peripancreatic edema with ill-defined parenchyma in the head of the pancreas including  a 3.0 x 2.9 cm hypoenhancing ill-defined focal pancreatic head lesion. Imaging features are considered suspicious for primary pancreatic adenocarcinoma. MRI abdomen with and without contrast may prove helpful to further evaluate. Endoscopic ultrasound likely prove helpful. 3. 4.5 x 3.4 cm necrotic mass lesion is identified just cranial to the esophagogastric junction with additional metastatic lymphadenopathy in the abdomen and pelvis. 4. Irregular nodular peritoneal thickening in the lower abdomen and pelvis, concerning for metastatic disease. 5. Bibasilar collapse/consolidation with small pleural effusions, left greater than right. 6. Trace amount of gas in the gallbladder lumen, presumably related to prior sphincterotomy. In the absence of prior sphincterotomy, ascending biliary infection would be a consideration. 7. Aortic Atherosclerosis (ICD10-I70.0). Electronically Signed   By: Misty Stanley M.D.   On: 02/11/2022 07:52   CT Angio Chest Pulmonary Embolism (PE) W or  WO Contrast  Result Date: 02/11/2022 CLINICAL DATA:  Recent PE. Hypoxia this morning. Clinical concern for recurrent pulmonary embolus. EXAM: CT ANGIOGRAPHY CHEST WITH CONTRAST TECHNIQUE: Multidetector CT imaging of the chest was performed using the standard protocol during bolus administration of intravenous contrast. Multiplanar CT image reconstructions and MIPs were obtained to evaluate the vascular anatomy. RADIATION DOSE REDUCTION: This exam was performed according to the departmental dose-optimization program which includes automated exposure control, adjustment of the mA and/or kV according to patient size and/or use of iterative reconstruction technique. CONTRAST:  126m OMNIPAQUE IOHEXOL 350 MG/ML SOLN COMPARISON:  01/31/2022 FINDINGS: Cardiovascular: Heart is enlarged. Mild atherosclerotic calcification is noted in the wall of the thoracic aorta. Left lower lobe segmental pulmonary embolus is stable in appearance (see image 155/series 7. The tiny segmental pulmonary embolus of the right upper lobe is also not appreciably changed in the interval (142/7). No new filling defect within the opacified pulmonary arteries to suggest the presence of additional acute pulmonary embolus on today's study. Mediastinum/Nodes: No mediastinal lymphadenopathy. There is no hilar lymphadenopathy. The esophagus has normal imaging features. There is no axillary lymphadenopathy. Lungs/Pleura: Patchy ground-glass opacities with superimposed nodular component seen previously in both upper lobes have resolved in the interval. There is a persistent 5 mm cavitary nodule in the anterior left upper lobe (76/6) bibasilar collapse/consolidation noted left greater than right. Small to moderate left and small right pleural effusions are progressive in the interval. Upper Abdomen: The liver shows diffusely decreased attenuation suggesting fat deposition. Subtle nodularity of liver contour raises the question of cirrhosis. 7.8 x 8.5 cm  subtle hypoattenuating mass lesion is identified in segment IV, similar to prior. Small volume free fluid is seen adjacent to the liver and spleen. Musculoskeletal: No worrisome lytic or sclerotic osseous abnormality. Multiple bilateral anterior rib fractures again noted in this patient with a reported history of recent CPR. Review of the MIP images confirms the above findings. IMPRESSION: 1. Similar appearance nonocclusive segmental and subsegmental pulmonary embolus to the left lower and right upper lobes. No new acute pulmonary embolus. 2. Interval resolution of the bilateral ground-glass opacities with superimposed nodular component seen previously in both upper lobes. 3. Persistent 5 mm cavitary nodule in the anterior left upper lobe. Follow-up CT chest in 3 months recommended to ensure stability. 4. Bibasilar collapse/consolidation left greater than right. 5. Small to moderate left and small right pleural effusions are progressive in the interval. 6. 7.8 x 8.5 cm subtle hypoattenuating mass lesion in segment IV of the liver, similar to prior but new since 06/26/2020. This could be metastatic disease or a primary liver mass. Consider dedicated abdomen/pelvis CT with oral  and intravenous contrast to further evaluate. 7. Multiple bilateral anterior rib fractures again noted in this patient with a reported history of recent CPR. 8. Small volume free fluid adjacent to the liver and spleen. 9. Hepatic steatosis. Subtle nodularity of liver contour raises the question of cirrhosis. 10. Aortic Atherosclerosis (ICD10-I70.0). Critical Value/emergent results were called by telephone at the time of interpretation on 02/11/2022 at 5:34 am to provider [facility] , who verbally acknowledged these results. Electronically Signed   By: Misty Stanley M.D.   On: 02/11/2022 05:35   DG Chest Port 1 View  Result Date: 02/11/2022 CLINICAL DATA:  Shortness of breath. EXAM: PORTABLE CHEST 1 VIEW COMPARISON:  February 01, 2022  FINDINGS: The endotracheal tube, nasogastric tube and left-sided venous catheter seen on the prior study have been removed. The cardiac silhouette is enlarged and predominant stable, given differences in technique. There is marked severity calcification of the aortic arch. Marked severity atelectasis and/or infiltrate is seen within the retrocardiac region of the left lung base. A small left pleural effusion is also noted. No pneumothorax is identified. The visualized skeletal structures are unremarkable. IMPRESSION: 1. Marked severity left basilar atelectasis and/or infiltrate. 2. Small left pleural effusion. Electronically Signed   By: Virgina Norfolk M.D.   On: 02/11/2022 02:39     Discharge Instructions: Discharge Instructions     Diet - low sodium heart healthy   Complete by: As directed    Discharge instructions   Complete by: As directed    You were hospitalized for shortness of breath. We treated you for pneumonia, gave you lasix to reduce the fluid on your lungs, and drained some of the fluid on your lungs. While here you were found to have a new mass of the liver, esophagus, and enlarged lymph nodes in the abdomen. The interventional radiology team biopsied this. We suspect this is esophageal cancer but will wait for the biopsy. You will follow up with the GI oncology team and the details for the appointment have been included. You were having pain in your chest due to broken ribs. We increased your pain medications and we discharged you with 2 days of percocet 70m-325mg to be taken once every 6 hours on top of your normal pain meds. You will need to talk to your pain specialist about prescribing further pain medication.   Increase activity slowly   Complete by: As directed    No wound care   Complete by: As directed        Signed: RIona Coach MD 02/17/2022, 2:43 PM   Pager: [phone]

## 2022-02-16 NOTE — Progress Notes (Signed)
Oncology Discharge Planning Note  [facility] at [place] Address: [address] [place], [place], [place] [address] Hours of Operation:  Nena Polio, Monday - Friday  Clinic Contact Information:  [phone]) [phone]  Oncology Care Team: Medical Oncologist:  Benay Spice  Patient Details: Name:  [name] [name], [name] [name] MRN:   [id number] DOB:   September 19, 1960 Reason for Current Admission: '@PPROB'$ @  Discharge Planning Narrative: Notification of admission received by In patient team for [name].  Discharge follow-up appointments for oncology are current and available on the AVS and MyChart.   Upon discharge from the hospital, hematology/oncology's post discharge plan of care for the outpatient setting is: Wednesday, February 22, 2022 at 1:10pm [facility] [place], [place] [address] [phone]    [name] will be called within two business days after discharge to review hematology/oncology's plan of care for full understanding.    Outpatient Oncology Specific Care Only: Oncology appointment transportation needs addressed?:  no Oncology medication management for symptom management addressed?:  no Chemo Alert Card reviewed?:  no Immunotherapy Alert Card reviewed?:  no

## 2022-02-16 NOTE — Progress Notes (Addendum)
ANTICOAGULATION CONSULT NOTE Pharmacy Consult for Heparin  Indication: h/o VTE Brief A/P: Heparin level subtherapeutic Increase Heparin rate Allergies  Allergen Reactions   Dilaudid [Hydromorphone Hcl] Rash   Ramipril Rash and Other (See Comments)    Per patient on R arm and leg only; no angioedema   Tramadol Itching and Rash    Patient Measurements: Height: '5\' 7"'$  (170.2 cm) Weight: 79.5 kg (175 lb 4.3 oz) IBW/kg (Calculated) : 66.1 Heparin Dosing Weight: 83.4 kg  Vital Signs: Temp: 97.6 F (36.4 C) (08/23 2009) Temp Source: Oral (08/23 2009) BP: 138/93 (08/23 2009) Pulse Rate: 93 (08/23 2009)  Labs: Recent Labs    02/13/22 0922 02/13/22 1820 02/14/22 0342 02/15/22 0400 02/16/22 0130  HGB 10.3*  --  9.3* 10.0* 10.3*  HCT 33.4*  --  29.0* 31.7* 31.8*  PLT 364  --  301 290 303  APTT 45*  --   --   --   --   HEPARINUNFRC 0.94*   < > 0.53 0.22* 0.16*  CREATININE 1.11  --  1.30* 0.97  --    < > = values in this interval not displayed.     Estimated Creatinine Clearance: 80.9 mL/min (by C-G formula based on SCr of 0.97 mg/dL).    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  Eliquis to restart in am Goal of Therapy:  Heparin level 0.3-0.7 Monitor platelets by anticoagulation protocol: Yes   Plan:  Increase Heparin 1300 units/hr  Phillis Knack, PharmD, BCPS  02/16/2022 2:14 AM

## 2022-02-16 NOTE — Evaluation (Signed)
Physical Therapy Evaluation Patient Details Name: [name] [name] MRN: [id number] DOB: 1961-05-11 Today's Date: 02/16/2022  History of Present Illness  61 y.o. male presents to [facility] hospital on 02/11/2022 with SOB, having a recent admission 8/8 for cardiac arrest, inferior STEMI and PE. Chest x-ray this admission demonstrates L atelectasis with pleural effusion. CT abdomen/pelvis concerning for primary pancreatic adenocarcinoma with metastatic disease to liver. Thoracentesis 8/19, liver biopsy 8/23. PMH includes aortic valve mass, HTN, Lupus, PVD, PNA, RA, L AKA.  Clinical Impression  Pt presents to PT with deficits in functional mobility, strength, power, gait, balance, endurance. Pt is limited somewhat by reports of pain in chest and ribs, otherwise by generalized endurance deficits. Pt currently benefits from some assistance to manage bed mobility and prosthetic, but is able to stand and transfer with assistance for safety only. Pt and visitor report the pt will have sufficient caregiver support for mobility and ADLs within the home, and would prefer he discharge there. PT recommends discharge home with HHPT and assistance from caregivers.       Recommendations for follow up therapy are one component of a multi-disciplinary discharge planning process, led by the attending physician.  Recommendations may be updated based on patient status, additional functional criteria and insurance authorization.  Follow Up Recommendations Home health PT      Assistance Recommended at Discharge Intermittent Supervision/Assistance  Patient can return home with the following  A little help with walking and/or transfers;A little help with bathing/dressing/bathroom;Assistance with cooking/housework;Assist for transportation;Help with stairs or ramp for entrance    Equipment Recommendations None recommended by PT  Recommendations for Other Services       Functional Status Assessment Patient has had a recent  decline in their functional status and demonstrates the ability to make significant improvements in function in a reasonable and predictable amount of time.     Precautions / Restrictions Precautions Precautions: Fall Precaution Comments: L AKA, prosthesis Restrictions Weight Bearing Restrictions: No      Mobility  Bed Mobility Overal bed mobility: Needs Assistance Bed Mobility: Sit to Supine, Supine to Sit     Supine to sit: Min assist Sit to supine: Supervision   General bed mobility comments: assist via hand hold to pull into sitting position    Transfers Overall transfer level: Needs assistance Equipment used: Rolling walker (2 wheels) Transfers: Sit to/from Stand Sit to Stand: Min guard                Ambulation/Gait Ambulation/Gait assistance: Min guard Gait Distance (Feet): 2 Feet Assistive device: Rolling walker (2 wheels) Gait Pattern/deviations: Step-to pattern Gait velocity: reduced Gait velocity interpretation: <1.31 ft/sec, indicative of household ambulator   General Gait Details: pt with 2 short steps, distance limited by reports of dizziness which the pt attributes to gabapentin  Stairs            Wheelchair Mobility    Modified Rankin (Stroke Patients Only)       Balance Overall balance assessment: Needs assistance Sitting-balance support: No upper extremity supported, Feet supported Sitting balance-Leahy Scale: Fair     Standing balance support: Bilateral upper extremity supported, Reliant on assistive device for balance Standing balance-Leahy Scale: Poor                               Pertinent Vitals/Pain Pain Assessment Pain Assessment: Faces Faces Pain Scale: Hurts even more Pain Location: ribs and chest Pain Descriptors / Indicators:  Aching Pain Intervention(s): Monitored during session    Home Living Family/patient expects to be discharged to:: Private residence Living Arrangements:  Non-relatives/Friends Available Help at Discharge: Friend(s);Available 24 hours/day Type of Home: House Home Access: Ramped entrance       Home Layout: One level Home Equipment: Tub bench;Grab bars - tub/shower;Rolling Walker (2 wheels);Wheelchair - manual;BSC/3in1;Hand held shower head      Prior Function Prior Level of Function : Needs assist             Mobility Comments: pt reports ambulating some with RW butr primarily utlizing a wheelchair for houshold mobility over the last 2 months ADLs Comments: friends assist with cooking and cleaning, states he was caring for his own ADLs prior to most recent hospitalization.     Hand Dominance   Dominant Hand: Left    Extremity/Trunk Assessment   Upper Extremity Assessment Upper Extremity Assessment: Overall WFL for tasks assessed (pain limiting strength/power at times)    Lower Extremity Assessment Lower Extremity Assessment: LLE deficits/detail LLE Deficits / Details: LLE AKA, ROM WFL    Cervical / Trunk Assessment Cervical / Trunk Assessment: Kyphotic  Communication   Communication: No difficulties  Cognition Arousal/Alertness: Awake/alert Behavior During Therapy: WFL for tasks assessed/performed Overall Cognitive Status: Impaired/Different from baseline Area of Impairment: Problem solving                             Problem Solving: Slow processing          General Comments General comments (skin integrity, edema, etc.): VSS on RA    Exercises     Assessment/Plan    PT Assessment Patient needs continued PT services  PT Problem List Decreased strength;Decreased activity tolerance;Decreased balance;Decreased mobility;Cardiopulmonary status limiting activity;Pain       PT Treatment Interventions DME instruction;Gait training;Functional mobility training;Therapeutic activities;Therapeutic exercise;Balance training;Neuromuscular re-education;Patient/family education;Wheelchair mobility training     PT Goals (Current goals can be found in the Care Plan section)  Acute Rehab PT Goals Patient Stated Goal: to return home and eventually to independence PT Goal Formulation: With patient Time For Goal Achievement: 03/02/22 Potential to Achieve Goals: Fair    Frequency Min 3X/week     Co-evaluation               AM-PAC PT "6 Clicks" Mobility  Outcome Measure Help needed turning from your back to your side while in a flat bed without using bedrails?: A Little Help needed moving from lying on your back to sitting on the side of a flat bed without using bedrails?: A Little Help needed moving to and from a bed to a chair (including a wheelchair)?: A Little Help needed standing up from a chair using your arms (e.g., wheelchair or bedside chair)?: A Little Help needed to walk in hospital room?: Total Help needed climbing 3-5 steps with a railing? : Total 6 Click Score: 14    End of Session   Activity Tolerance: Patient tolerated treatment well Patient left: in chair;with call bell/phone within reach;with chair alarm set;with family/visitor present Nurse Communication: Mobility status PT Visit Diagnosis: Other abnormalities of gait and mobility (R26.89);Difficulty in walking, not elsewhere classified (R26.2);Muscle weakness (generalized) (M62.81)    Time: 1025-8527 PT Time Calculation (min) (ACUTE ONLY): 41 min   Charges:   PT Evaluation $PT Eval Low Complexity: [place], PT, DPT Acute Rehabilitation Office [phone]  Zenaida Niece 02/16/2022, 12:46 PM

## 2022-02-17 ENCOUNTER — Ambulatory Visit: Payer: Medicare Other | Admitting: Nurse Practitioner

## 2022-02-17 DIAGNOSIS — I10 Essential (primary) hypertension: Secondary | ICD-10-CM

## 2022-02-17 DIAGNOSIS — I2510 Atherosclerotic heart disease of native coronary artery without angina pectoris: Secondary | ICD-10-CM | POA: Diagnosis not present

## 2022-02-17 DIAGNOSIS — J910 Malignant pleural effusion: Secondary | ICD-10-CM | POA: Diagnosis not present

## 2022-02-17 DIAGNOSIS — R0781 Pleurodynia: Secondary | ICD-10-CM | POA: Diagnosis not present

## 2022-02-17 MED ORDER — OXYCODONE-ACETAMINOPHEN 5-325 MG PO TABS: 1.0000 | ORAL_TABLET | Freq: Four times a day (QID) | ORAL | 0 refills | Status: AC

## 2022-02-17 MED ORDER — OXYCODONE-ACETAMINOPHEN 5-325 MG PO TABS: 1.0000 | ORAL_TABLET | Freq: Four times a day (QID) | ORAL | 0 refills | Status: DC

## 2022-02-17 NOTE — Progress Notes (Signed)
Patient given discharge instructions and stated understanding. 

## 2022-02-17 NOTE — Progress Notes (Addendum)
Occupational Therapy Treatment Patient Details Name: [name] [name] MRN: [id number] DOB: June 02, 1961 Today's Date: 02/17/2022   History of present illness 61 y.o. male presents to [facility] hospital on 02/11/2022 with SOB, having a recent admission 8/8 for cardiac arrest, inferior STEMI and PE. Chest x-ray this admission demonstrates L atelectasis with pleural effusion. CT abdomen/pelvis concerning for primary pancreatic adenocarcinoma with metastatic disease to liver. Thoracentesis 8/19, liver biopsy 8/23. PMH includes aortic valve mass, HTN, Lupus, PVD, PNA, RA, L AKA.   OT comments  Pt progressing towards acute OT goals. Min assist to squat pivot from EOB to recliner. Pt reports swelling in L residual limb and did not wish to use prosthesis this session "I fell asleep last night with the rubber part on." Roommate present and involved throughout session. VSS on RA. Some dizziness reports with positional change which improved with time. D/c recommendation remains appropriate.    Recommendations for follow up therapy are one component of a multi-disciplinary discharge planning process, led by the attending physician.  Recommendations may be updated based on patient status, additional functional criteria and insurance authorization.    Follow Up Recommendations  Home health OT    Assistance Recommended at Discharge Frequent or constant Supervision/Assistance  Patient can return home with the following  A lot of help with bathing/dressing/bathroom;Assistance with cooking/housework;Assist for transportation;A lot of help with walking and/or transfers;Help with stairs or ramp for entrance   Equipment Recommendations  None recommended by OT    Recommendations for Other Services      Precautions / Restrictions Precautions Precautions: Fall Precaution Comments: L AKA, prosthesis Restrictions Weight Bearing Restrictions: No       Mobility Bed Mobility Overal bed mobility: Needs Assistance Bed  Mobility: Supine to Sit     Supine to sit: Min guard, HOB elevated          Transfers Overall transfer level: Needs assistance Equipment used: Rolling walker (2 wheels) Transfers: Bed to chair/wheelchair/BSC     Squat pivot transfers: Min assist, Min guard       General transfer comment: EOB to recliner positioned in front of pt, at an angle to where pt was sitting.     Balance Overall balance assessment: Needs assistance Sitting-balance support: No upper extremity supported, Feet supported Sitting balance-Leahy Scale: Fair                                     ADL either performed or assessed with clinical judgement   ADL Overall ADL's : Needs assistance/impaired                         Toilet Transfer: Min Fish farm manager Details (indicate cue type and reason): EOB to drop arm recliner. chair positioned in front of pt per pt request. min guard for safety. Extra time and effort           General ADL Comments: SpO2 upper 90s throughout session on RA.    Extremity/Trunk Assessment Upper Extremity Assessment Upper Extremity Assessment: Generalized weakness   Lower Extremity Assessment Lower Extremity Assessment: Defer to PT evaluation        Vision       Perception     Praxis      Cognition Arousal/Alertness: Awake/alert Behavior During Therapy: WFL for tasks assessed/performed Overall Cognitive Status: Within Functional Limits for tasks assessed  Exercises      Shoulder Instructions       General Comments VSS on RA    Pertinent Vitals/ Pain       Pain Assessment Pain Assessment: Faces Faces Pain Scale: Hurts little more Pain Location: ribs and chest Pain Descriptors / Indicators: Discomfort, Guarding, Tender Pain Intervention(s): Monitored during session  Home Living                                           Prior Functioning/Environment              Frequency  Min 2X/week        Progress Toward Goals  OT Goals(current goals can now be found in the care plan section)  Progress towards OT goals: Progressing toward goals  Acute Rehab OT Goals Patient Stated Goal: home OT Goal Formulation: With patient Time For Goal Achievement: 03/01/22 Potential to Achieve Goals: Fair ADL Goals Pt Will Perform Grooming: sitting;with set-up Pt Will Perform Lower Body Bathing: with modified independence;sitting/lateral leans Pt Will Perform Upper Body Dressing: with set-up;sitting Pt Will Perform Lower Body Dressing: with min assist;sitting/lateral leans Pt Will Transfer to Toilet: with supervision;stand pivot transfer;bedside commode Pt Will Perform Toileting - Clothing Manipulation and hygiene: with set-up;sitting/lateral leans  Plan Discharge plan needs to be updated    Co-evaluation                 AM-PAC OT "6 Clicks" Daily Activity     Outcome Measure   Help from another person eating meals?: None Help from another person taking care of personal grooming?: A Little Help from another person toileting, which includes using toliet, bedpan, or urinal?: A Lot Help from another person bathing (including washing, rinsing, drying)?: A Lot Help from another person to put on and taking off regular upper body clothing?: A Little Help from another person to put on and taking off regular lower body clothing?: A Lot 6 Click Score: 16    End of Session    OT Visit Diagnosis: Other abnormalities of gait and mobility (R26.89)   Activity Tolerance Patient limited by fatigue   Patient Left in chair;with call bell/phone within reach;with family/visitor present (with PT)   Nurse Communication          Time: 7371-0626 OT Time Calculation (min): 20 min  Charges: OT General Charges $OT Visit: 1 Visit OT Treatments $Self Care/Home Management : 8-22 mins  Tyrone Schimke, OT Acute Rehabilitation Services Office: [phone]   Hortencia Pilar 02/17/2022, 1:55 PM

## 2022-02-17 NOTE — Progress Notes (Signed)
Physical Therapy Treatment Patient Details Name: [name] [name] MRN: [id number] DOB: 02/23/1961 Today's Date: 02/17/2022   History of Present Illness 61 y.o. male presents to [facility] hospital on 02/11/2022 with SOB, having a recent admission 8/8 for cardiac arrest, inferior STEMI and PE. Chest x-ray this admission demonstrates L atelectasis with pleural effusion. CT abdomen/pelvis concerning for primary pancreatic adenocarcinoma with metastatic disease to liver. Thoracentesis 8/19, liver biopsy 8/23. PMH includes aortic valve mass, HTN, Lupus, PVD, PNA, RA, L AKA.    PT Comments    Worked on transfers and w/c mobility since pt reports lt residual limb with edema making prosthetic uncomfortable. Pt able to propel in hallway with BUE's modiified independent. Pt able to maintain SpO2 >94% on RA with activity.   Recommendations for follow up therapy are one component of a multi-disciplinary discharge planning process, led by the attending physician.  Recommendations may be updated based on patient status, additional functional criteria and insurance authorization.  Follow Up Recommendations  Home health PT     Assistance Recommended at Discharge Intermittent Supervision/Assistance  Patient can return home with the following A little help with walking and/or transfers;Assist for transportation;Assistance with cooking/housework   Equipment Recommendations  None recommended by PT    Recommendations for Other Services       Precautions / Restrictions Precautions Precautions: Fall Precaution Comments: L AKA, prosthesis Restrictions Weight Bearing Restrictions: No     Mobility  Bed Mobility               General bed mobility comments: Pt up in chair    Transfers Overall transfer level: Needs assistance Equipment used: None Transfers: Bed to chair/wheelchair/BSC       Squat pivot transfers: Min assist     General transfer comment: Assist for stability.    Ambulation/Gait                    Theme park manager mobility: Yes Wheelchair propulsion: Both upper extremities Distance: 75  Modified Rankin (Stroke Patients Only)       Balance Overall balance assessment: Needs assistance Sitting-balance support: No upper extremity supported, Feet supported Sitting balance-Leahy Scale: Fair                                      Cognition Arousal/Alertness: Awake/alert Behavior During Therapy: WFL for tasks assessed/performed Overall Cognitive Status: Within Functional Limits for tasks assessed                                          Exercises      General Comments General comments (skin integrity, edema, etc.): SpO2 >94% with activity including w/c propulsion      Pertinent Vitals/Pain Pain Assessment Pain Assessment: Faces Faces Pain Scale: Hurts little more Pain Location: ribs and chest Pain Descriptors / Indicators: Guarding, Tender Pain Intervention(s): Limited activity within patient's tolerance    Home Living                          Prior Function            PT Goals (current goals can now be found in the care plan section) Progress towards PT  goals: Progressing toward goals    Frequency    Min 3X/week      PT Plan Current plan remains appropriate    Co-evaluation              AM-PAC PT "6 Clicks" Mobility   Outcome Measure  Help needed turning from your back to your side while in a flat bed without using bedrails?: A Little Help needed moving from lying on your back to sitting on the side of a flat bed without using bedrails?: A Little Help needed moving to and from a bed to a chair (including a wheelchair)?: A Little Help needed standing up from a chair using your arms (e.g., wheelchair or bedside chair)?: A Little Help needed to walk in hospital room?: Total Help needed climbing 3-5 steps with a railing?  : Total 6 Click Score: 14    End of Session   Activity Tolerance: Patient tolerated treatment well Patient left: in chair;with call bell/phone within reach;with chair alarm set   PT Visit Diagnosis: Other abnormalities of gait and mobility (R26.89);Difficulty in walking, not elsewhere classified (R26.2);Muscle weakness (generalized) (M62.81)     Time: 9449-6759 PT Time Calculation (min) (ACUTE ONLY): 15 min  Charges:  $Wheel Chair Management: 8-22 mins                     [place] [facility] [place] 02/17/2022, 2:34 PM

## 2022-02-17 NOTE — Progress Notes (Deleted)
Cardiology Office Note:    Date:  02/17/2022   ID:  [name] [name], DOB 1960-11-04, MRN [id number]  PCP:  Angelique Blonder, DO   [facility] Providers Cardiologist:  Candee Furbish, MD { Click to update primary MD,subspecialty MD or APP then REFRESH:1}    Referring MD: Angelique Blonder, DO   Chief Complaint: ***  History of Present Illness:    [name] [name] is a *** 61 y.o. male with a hx of PAD s/p right AKA and multiple prior interventions with VVS, nonobstructive CAD, mild to moderate AI, and Libman-Sacks endocarditis 2013, subacute occipital CVA   CTA coronary calcium score was 25 with mild to moderate nonobstructive plaque in the RCA in 2017. Cardiac catheteriazation revealed non-obstructive CAD  Seen 01/10/22 acute right popliteal and tibial DVT and started on Eliquis.  Admission 8/8-8 Seen by cardiology during admission for evaluation of cardiac arrest.  Per EMS on 8/8 he took a "pain pill" unclear if it was an opioid or gabapentin.  Subsequently was witnessed to slump in his wheelchair and become unresponsive.  Family work to get him out of his wheelchair to the floor, unclear as to when CPR was started.  Per EMS patient received 35 minutes of CPR with initial rhythm V-fib.  He received 3 shocks, 3 rounds of epinephrine, and 3 mg amiodarone.  Upon arrival he was in sinus bradycardia.  EKG showed ST elevation in inferior leads concerning for STEMI.  Code STEMI was activated.  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   Hypertension    states is borderline on medication; has been on med. x 5-6 yr.   Hypokalemia 05/30/2018   Impingement syndrome of shoulder region 10/2014   left   Insomnia 06/01/2015   Ischemic ulcer of  toe of left foot (HCC)    Left shoulder pain 12/16/2015   Low back pain without sciatica 10/29/2009   Lupus ([place])    Peripheral vascular disease (HCC)    Pneumonia    RA (rheumatoid arthritis) ([place])    Rotator cuff tear 11/04/2014    Past Surgical History:  Procedure Laterality Date   ABDOMINAL AORTAGRAM  02/20/2019   ABDOMINAL AORTOGRAM W/LOWER EXTREMITY N/A 02/20/2019   Procedure: ABDOMINAL AORTOGRAM W/LOWER EXTREMITY;  Surgeon: Marty Heck, MD;  Location: [place] [facility];  Service: Cardiovascular;  Laterality: N/A;   ACHILLES TENDON SURGERY Bilateral    AMPUTATION Left 05/11/2020   Procedure: LEFT ABOVE KNEE AMPUTATION;  Surgeon: Angelia Mould, MD;  Location: [place];  Service: Vascular;  Laterality: Left;   ENDARTERECTOMY POPLITEAL Left 05/01/2020   Procedure: ENDARTERECTOMY POPLITEAL;  Surgeon: Waynetta Sandy, MD;  Location: [place];  Service: Vascular;  Laterality: Left;   FASCIOTOMY Left 05/01/2020   Procedure: FASCIOTOMY;  Surgeon: Waynetta Sandy, MD;  Location: [place];  Service: Vascular;  Laterality: Left;   FEMORAL-POPLITEAL BYPASS GRAFT Left 02/26/2019   Procedure: BYPASS GRAFT FEMORAL-POPLITEAL ARTERY LEFT LEG USING 39m PROPATEN GRAFT;  Surgeon: BSerafina Mitchell MD;  [facility];  Service: Vascular;  Laterality: Left;   INTRAOPERATIVE ARTERIOGRAM Left 01/22/2020   Procedure: INTRA OPERATIVE ARTERIOGRAM with administration of thrombolyics in  Left femoral to popliteal bypass.;  Surgeon: Marty Heck, MD;  Location: [place];  Service: Vascular;  Laterality: Left;   LEFT HEART CATH AND CORONARY ANGIOGRAPHY N/A 03/15/2017   Procedure: LEFT HEART CATH AND CORONARY ANGIOGRAPHY;  Surgeon: Nelva Bush, MD;  Location: [place] [facility];  Service: Cardiovascular;  Laterality: N/A;   LEFT HEART CATH AND CORONARY ANGIOGRAPHY N/A 02/06/2022   Procedure: LEFT HEART CATH AND CORONARY ANGIOGRAPHY;  Surgeon: Martinique, Peter M, MD;  Location: [place] [facility];  Service: Cardiovascular;  Laterality: N/A;   LOWER EXTREMITY INTERVENTION Left 04/29/2020   Procedure: LOWER EXTREMITY INTERVENTION- LYSIS;  Surgeon: Marty Heck, MD;  Location: [place] [facility];  Service: Cardiovascular;  Laterality: Left;   OSTEOTOMY AND ULNAR SHORTENING Right 07/22/2002   PATCH ANGIOPLASTY Left 05/01/2020   Procedure: PATCH ANGIOPLASTY USING HEMASHIELD PLATINUM FINESSE PATCH;  Surgeon: Waynetta Sandy, MD;  Location: [place];  Service: Vascular;  Laterality: Left;   PERIPHERAL VASCULAR ATHERECTOMY  01/23/2020   Procedure: PERIPHERAL VASCULAR ATHERECTOMY;  Surgeon: Marty Heck, MD;  Location: [place] [facility];  Service: Cardiovascular;;   PERIPHERAL VASCULAR BALLOON ANGIOPLASTY  01/23/2020   Procedure: PERIPHERAL VASCULAR BALLOON ANGIOPLASTY;  Surgeon: Marty Heck, MD;  Location: [facility];  Service: Cardiovascular;;   PERIPHERAL VASCULAR INTERVENTION Left 04/30/2020   Procedure: PERIPHERAL VASCULAR INTERVENTION;  Surgeon: Cherre Robins, MD;  Location: [facility];  Service: Cardiovascular;  Laterality: Left;   PERIPHERAL VASCULAR THROMBECTOMY N/A 01/23/2020   Procedure: lysis recheck;  Surgeon: Marty Heck, MD;  Location: [facility];  Service: Cardiovascular;  Laterality: N/A;  + [place]    PERIPHERAL VASCULAR THROMBECTOMY N/A 04/30/2020   Procedure: Lysis Recheck;  Surgeon: Cherre Robins, MD;  Location: [place] [facility];  Service: Cardiovascular;  Laterality: N/A;   SHOULDER ARTHROSCOPY WITH BICEPSTENOTOMY Right 03/11/2015   Procedure: SHOULDER ARTHROSCOPY WITH BICEPSTENOTOMY;  Surgeon: Ninetta Lights, MD;  Location: [name];  Service: Orthopedics;  Laterality: Right;   SHOULDER ARTHROSCOPY WITH DISTAL CLAVICLE RESECTION Left 11/19/2014   Procedure: SHOULDER ARTHROSCOPY WITH DISTAL CLAVICLE RESECTION;  Surgeon: Kathryne Hitch, MD;  Location: [place];   Service: Orthopedics;  Laterality: Left;   SHOULDER ARTHROSCOPY WITH ROTATOR CUFF REPAIR AND SUBACROMIAL DECOMPRESSION Left 11/19/2014   Procedure: LEFT SHOULDER ARTHROSCOPY, DEBRIDEMENT DISTAL CLAVICLE EXCISION, ACROMIOPLASTY WITH ROTATOR CUFF REPAIR ;  Surgeon: Kathryne Hitch, MD;  Location: [place];  Service: Orthopedics;  Laterality: Left;   THROMBECTOMY OF BYPASS GRAFT FEMORAL- POPLITEAL ARTERY Left 05/01/2020   Procedure: THROMBECTOMY OF LOWER EXTREMITY;  Surgeon: Waynetta Sandy, MD;  Location: [place];  Service: Vascular;  Laterality: Left;   TRANSFORAMINAL LUMBAR INTERBODY FUSION (TLIF) WITH PEDICLE SCREW FIXATION 1 LEVEL N/A 01/03/2018   Procedure: TRANSFORAMINAL LUMBAR INTERBODY FUSION (TLIF) LUMBAR FIVE-SACRAL ONE;  Surgeon: Melina Schools, MD;  Location: [place];  Service: Orthopedics;  Laterality: N/A;   ULTRASOUND GUIDANCE FOR VASCULAR ACCESS Right 01/22/2020   Procedure: ULTRASOUND GUIDANCE FOR VASCULAR ACCESS Right Common Femoral Artery.;  Surgeon: Marty Heck, MD;  Location: [facility];  Service: Vascular;  Laterality: Right;   VIDEO ASSISTED THORACOSCOPY (VATS)/DECORTICATION Right 11/21/2011   drainage of empyema    Current Medications: No outpatient medications have been marked as taking for the 02/17/22 encounter (Appointment) with Emmaline Life, NP.     Allergies:   Dilaudid [hydromorphone hcl], Ramipril, and Tramadol   Social  History   Socioeconomic History   Marital status: Single    Spouse name: Not on file   Number of children: 0   Years of education: Not on file   Highest education level: Not on file  Occupational History   Occupation: Disabled    Comment: 2/2 RA  Tobacco Use   Smoking status: Former    Packs/day: 0.25    Years: 20.00    Total pack years: 5.00    Types: Cigarettes    Quit date: 02/19/2019    Years since quitting: 2.9   Smokeless tobacco: Never   Tobacco comments:    Patient stated he smokes a couple day and  is attempting to quit smoking  Vaping Use   Vaping Use: Never used  Substance and Sexual Activity   Alcohol use: No    Alcohol/week: 0.0 standard drinks of alcohol    Comment: sober 1998   Drug use: No   Sexual activity: Not on file  Other Topics Concern   Not on file  Social History Narrative   On disability since 1992, used to work with city of [place]. Quit drinking in 1990.      Current Social History 11/05/2019        Patient lives by himself most of the time. Sometimes girlfriend's son stays with him in a one level home. There are 3 steps with handrail up to the entrance the patient uses.       Patient's method of transportation is personal truck. This was recently vandalized and patient doesn't have funds to repair at this time.      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, BSN, RN-BC    Social Determinants of Health   Financial Resource Strain: Not on file  Food Insecurity: No Food Insecurity (01/27/2022)   Hunger Vital Sign    Worried About Running Out of Food in the Last Year: Never true    [place] in the Last Year: Never true  Transportation Needs: No Transportation Needs (01/27/2022)   PRAPARE - Hydrologist (Medical): No    Lack of Transportation (Non-Medical): No  Physical Activity: Not on file  Stress: Not on file  Social Connections: Not on file     Family History: The patient's ***family history includes Alcohol abuse in his father; Aneurysm in his mother; Heart attack (age of onset: 37) in his  father. There is no history of Stroke or Cancer.  ROS:   Please see the history of present illness.    *** All other systems reviewed and are negative.  Labs/Other Studies Reviewed:    The following studies were reviewed today:  LHC 02/06/2022    Dist Cx lesion is 30% stenosed.   Prox RCA lesion is 100% stenosed.   LV end diastolic pressure is normal.   Single vessel occlusive CAD. Occlusion of proximal nondominant RCA which is  new since 2018.  Normal LVEDP   Plan: medical therapy    LHC 03/15/2017  Conclusions: Mild to moderate, non-obstructive CAD involving codominant LCx and RCA. Normal left ventricular contraction and filling pressure.   Recommendations: Medical therapy and risk factor modification to prevent progression of disease.    CCTA 12/09/2015  IMPRESSION: 1. Coronary calcium score of 25. This was 73 percentile for age and sex matched control. 2.  Normal coronary origin with right and left co-dominance. 3. Mild to moderate non-obstructive plaque in the RCA. An aggressive risk factor modification is recommended. 4.  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 5. Mildly dilated pulmonary artery suggestive of pulmonary hypertension.   Recent Labs: 02/03/2022: Magnesium 2.1 02/11/2022: ALT 43; B Natriuretic Peptide 330.0 02/15/2022: BUN 20; Creatinine, Ser 0.97; Potassium 4.1; Sodium 133 02/16/2022: Hemoglobin 10.3; Platelets 303  Recent Lipid Panel    Component Value Date/Time   CHOL 165 01/31/2022 1306   TRIG 226 (H) 02/01/2022 0414   HDL 19 (L) 01/31/2022 1306   CHOLHDL 8.7 01/31/2022 1306   VLDL 56 (H) 01/31/2022 1306   LDLCALC 90 01/31/2022 1306     Risk Assessment/Calculations:   {Does this patient have ATRIAL FIBRILLATION?:[phone]}       Physical Exam:    VS:  There were no vitals taken for this visit.    Wt Readings from Last 3 Encounters:  02/17/22 173 lb 1 oz (78.5 kg)  02/08/22 176 lb 5.9 oz (80 kg)  07/18/21 178  lb (80.7 kg)     GEN: *** Well nourished, well developed in no acute distress HEENT: Normal NECK: No JVD; No carotid bruits CARDIAC: ***RRR, no murmurs, rubs, gallops RESPIRATORY:  Clear to auscultation without rales, wheezing or rhonchi  ABDOMEN: Soft, non-tender, non-distended MUSCULOSKELETAL:  No edema; No deformity. *** pedal pulses, ***bilaterally SKIN: Warm and dry NEUROLOGIC:  Alert and oriented x 3 PSYCHIATRIC:  Normal affect   EKG:  EKG is *** ordered today.  The ekg ordered today demonstrates ***       Diagnoses:    No diagnosis found. Assessment and Plan:       {Are you ordering a CV Procedure (e.g. stress test, cath, DCCV, TEE, etc)?   Press F2        :[id number]}   Disposition:  Medication Adjustments/Labs and Tests Ordered: Current medicines are reviewed at length with the patient today.  Concerns regarding medicines are outlined above.  No orders of the defined types were placed in this encounter.  No orders of the defined types were placed in this encounter.   There are no Patient Instructions on file for this visit.   Signed, Emmaline Life, NP  02/17/2022 5:21 AM    [place]

## 2022-02-17 NOTE — Progress Notes (Signed)
SATURATION QUALIFICATIONS: (This note is used to comply with regulatory documentation for home oxygen)  Patient Saturations on Room Air at Rest = 97%%  Patient Saturations on Room Air while propelling wheelchair = 95%  Patient Saturations on N/A Liters of oxygen while Ambulating = N/A  Please briefly explain why patient needs home oxygen:Does not currently need home O2.   [place] Office [phone]

## 2022-02-17 NOTE — TOC Transition Note (Addendum)
Transition of Care [facility]) - CM/SW Discharge Note   Patient Details  Name: [name] [name] MRN: [id number] Date of Birth: 1960-09-06  [facility]) CM/SW Contact:  Zenon Mayo, RN Phone Number: 02/17/2022, 3:26 PM   Clinical Narrative:    Patient is for dc home today, per physical therapy and occupational therapy are rec HHPT/HHOT.  NCM offered choice with Medicare. Gov list, patient states he does not have a preference.  NCM saw in previous note where he was set up with [facility],  NCM spoke with Levada Dy , she will call this NCM back to let know if he is active with them and if they can take the referral.  NCM asked patient ,who will be his support at home , he states he has friends that live with him.  There is a friend at the bedside who will be transporting him home today.  Per Dr. Dorothea Ogle the internal medicine doc states they will call the patient to set up his follow up apt at the internal medicine clinic.  Per Levada Dy with [facility] they can take referral , soc will begin on Tuesday. Per Doctor they are just ordering HHPT because he has a lot of help at home.  NCM notified Levada Dy with [facility]).   Final next level of care: [place] Barriers to Discharge: No Barriers Identified   Patient Goals and CMS Choice Patient states their goals for this hospitalization and ongoing recovery are:: return home CMS Medicare.gov Compare Post Acute Care list provided to:: Patient Choice offered to / list presented to : Patient  Discharge Placement                       Discharge Plan and Services                  DME Agency: NA       HH Arranged: PT, OT HH Agency: [place] Date [place]: 02/17/22 Time [place]: 6004 Representative spoke with at [place]: [place] ([place]) Interventions     Readmission Risk Interventions    05/13/2020    2:32 PM 05/04/2020   12:24 PM   Readmission Risk Prevention Plan  Post Dischage Appt  Complete  Medication Screening  Complete  Transportation Screening Complete Complete  PCP or Specialist Appt within 5-7 Days Complete   Home Care Screening Complete   Medication Review (RN CM) Complete

## 2022-02-17 NOTE — Care Management Important Message (Signed)
Important Message  Patient Details  Name: [name] [name] MRN: [id number] Date of Birth: 23-Jan-1961   Medicare Important Message Given:  Yes     [name] 02/17/2022, 10:53 AM

## 2022-02-20 ENCOUNTER — Telehealth: Payer: Self-pay

## 2022-02-20 ENCOUNTER — Other Ambulatory Visit: Payer: Self-pay | Admitting: Student

## 2022-02-20 DIAGNOSIS — G894 Chronic pain syndrome: Secondary | ICD-10-CM | POA: Diagnosis not present

## 2022-02-20 DIAGNOSIS — M069 Rheumatoid arthritis, unspecified: Secondary | ICD-10-CM | POA: Diagnosis not present

## 2022-02-20 DIAGNOSIS — M4326 Fusion of spine, lumbar region: Secondary | ICD-10-CM | POA: Diagnosis not present

## 2022-02-20 DIAGNOSIS — M5136 Other intervertebral disc degeneration, lumbar region: Secondary | ICD-10-CM | POA: Diagnosis not present

## 2022-02-20 DIAGNOSIS — I739 Peripheral vascular disease, unspecified: Secondary | ICD-10-CM

## 2022-02-20 MED ORDER — ATORVASTATIN CALCIUM 80 MG PO TABS: 80.0000 mg | ORAL_TABLET | Freq: Every day | ORAL | 11 refills | Status: DC

## 2022-02-20 NOTE — Telephone Encounter (Signed)
Refill  atorvastatin (LIPITOR) 80 MG tablet  [id number] [place], [place] - [address] AT [address]

## 2022-02-20 NOTE — Telephone Encounter (Signed)
Pt is requesting a call back.  Pt states he is need of a letter being sent to his Pain Med Dr. Mohammed Kindle in reference to his Pain medication that was prescribed while he was inpatient.

## 2022-02-20 NOTE — Patient Outreach (Signed)
  Care Coordination TOC Note Transition Care Management Follow-up Telephone Call Date of discharge and from where[facility] 02/11/22-02/17/22 How have you been since you were released from the hospital? "I am feeling okay since my discharge." Any questions or concerns? No  Items Reviewed: Did the pt receive and understand the discharge instructions provided? Yes  Medications obtained and verified? Yes  Other? No  Any new allergies since your discharge? No  Dietary orders reviewed? No Do you have support at home? Yes   Home Care and Equipment/Supplies: Were home health services ordered? yes If so, what is the name of the agency? [facility] for PT only  Has the agency set up a time to come to the patient's home? no Were any new equipment or medical supplies ordered?  No What is the name of the medical supply agency? N/A Were you able to get the supplies/equipment? yes Do you have any questions related to the use of the equipment or supplies? No  Functional Questionnaire: (I = Independent and D = Dependent) ADLs: I  Bathing/Dressing- I  Meal Prep- I  Eating- I  Maintaining continence- I  Transferring/Ambulation- I  Managing Meds- I  Follow up appointments reviewed:  PCP Hospital f/u appt confirmed? Yes  Scheduled to see Dr. Humphrey Rolls on 03/02/22 @ 1:15. [facility] f/u appt confirmed? Yes  Scheduled to see Dr. Benay Spice (oncology) on 02/22/22 @ 1:10. Are transportation arrangements needed? No  If their condition worsens, is the pt aware to call PCP or go to the Emergency Dept.? Yes Was the patient provided with contact information for the PCP's office or ED? Yes Was to pt encouraged to call back with questions or concerns? Yes  SDOH assessments and interventions completed:   Yes  Care Coordination Interventions Activated:  Yes   Care Coordination Interventions:   No further interventions needed     Encounter Outcome:  Pt. Visit Completed

## 2022-02-21 ENCOUNTER — Telehealth: Payer: Self-pay

## 2022-02-21 NOTE — Telephone Encounter (Signed)
Pt called / informed Lipitor was refilled yesterday [facility] [address] Also he stated when he was in the hospital he was given Oxycodone 5-325 mg and the pain clinic wants to know if it's ok to back to 10-325 mg. I asked the pt have Dr Ethel Rana office to call our office - stated he will.

## 2022-02-21 NOTE — Telephone Encounter (Signed)
Pt states atorvastatin (LIPITOR) 80 MG tablet is not at the pharmacy.  Requesting to speak with a nurse about taking oxycodone 10 mg, instead '5mg'$ . Please call pt back.

## 2022-02-22 ENCOUNTER — Encounter: Payer: Self-pay | Admitting: *Deleted

## 2022-02-22 ENCOUNTER — Inpatient Hospital Stay: Payer: Medicare Other | Attending: Oncology | Admitting: Oncology

## 2022-02-22 VITALS — BP 116/90 | HR 94 | Temp 98.1°F | Resp 18 | Ht 67.0 in | Wt 147.9 lb

## 2022-02-22 DIAGNOSIS — Z86711 Personal history of pulmonary embolism: Secondary | ICD-10-CM | POA: Insufficient documentation

## 2022-02-22 DIAGNOSIS — C787 Secondary malignant neoplasm of liver and intrahepatic bile duct: Secondary | ICD-10-CM | POA: Insufficient documentation

## 2022-02-22 DIAGNOSIS — C801 Malignant (primary) neoplasm, unspecified: Secondary | ICD-10-CM | POA: Insufficient documentation

## 2022-02-22 DIAGNOSIS — D509 Iron deficiency anemia, unspecified: Secondary | ICD-10-CM | POA: Insufficient documentation

## 2022-02-22 DIAGNOSIS — J90 Pleural effusion, not elsewhere classified: Secondary | ICD-10-CM | POA: Diagnosis not present

## 2022-02-22 DIAGNOSIS — K7689 Other specified diseases of liver: Secondary | ICD-10-CM | POA: Diagnosis not present

## 2022-02-22 DIAGNOSIS — I219 Acute myocardial infarction, unspecified: Secondary | ICD-10-CM | POA: Diagnosis not present

## 2022-02-22 DIAGNOSIS — Z86718 Personal history of other venous thrombosis and embolism: Secondary | ICD-10-CM | POA: Diagnosis not present

## 2022-02-22 NOTE — Progress Notes (Signed)
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 

## 2022-02-22 NOTE — Progress Notes (Signed)
START OFF PATHWAY REGIMEN - Other   [id number] (Leucovorin IV D1 + Fluorouracil IV D1/CIV D1,2 + Oxaliplatin IV D1) q14 Days:   A cycle is every 14 days:     Oxaliplatin      Leucovorin      Fluorouracil      Fluorouracil   **Always confirm dose/schedule in your pharmacy ordering system**  Patient Characteristics: Intent of Therapy: Non-Curative / Palliative Intent, Discussed with Patient 

## 2022-02-22 NOTE — Progress Notes (Signed)
[name] [name] OFFICE PROGRESS NOTE   Diagnosis: Metastatic adenocarcinoma  INTERVAL HISTORY:   [name] [name] was discharged from the hospital on 02/16/2022.  He continues to have discomfort at the anterior chest wall following CPR.  No other complaint.  No dysphagia. He is here today with a friend.  Objective:  Vital signs in last 24 hours:  Blood pressure (!) 116/90, pulse 94, temperature 98.1 F (36.7 C), temperature source Oral, resp. rate 18, height _0  (1.702 m), SpO2 95 %.    HEENT: Neck without mass Lymphatics: No cervical, supraclavicular, axillary, or inguinal nodes Resp: Lungs clear bilaterally, decreased breath sounds at the lower chest bilaterally Cardio: Regular rate and rhythm GI: No mass, nontender, no hepatosplenomegaly Vascular: No right leg edema   Lab Results:  Lab Results  Component Value Date   WBC 8.8 02/16/2022   HGB 10.3 (L) 02/16/2022   HCT 31.8 (L) 02/16/2022   MCV 79.3 (L) 02/16/2022   PLT 303 02/16/2022   NEUTROABS 4.5 01/31/2022    CMP  Lab Results  Component Value Date   NA 133 (L) 02/15/2022   K 4.1 02/15/2022   CL 100 02/15/2022   CO2 24 02/15/2022   GLUCOSE 137 (H) 02/15/2022   BUN 20 02/15/2022   CREATININE 0.97 02/15/2022   CALCIUM 9.0 02/15/2022   PROT 6.1 (L) 02/11/2022   ALBUMIN 2.0 (L) 02/11/2022   AST 29 02/11/2022   ALT 43 02/11/2022   ALKPHOS 195 (H) 02/11/2022   BILITOT 0.5 02/11/2022   GFRNONAA >60 02/15/2022   GFRAA >60 01/24/2020    Lab Results  Component Value Date   CEA1 287.0 (H) 02/11/2022   YNW295 88 (H) 02/11/2022   Medications: I have reviewed the patient's current medications.   Assessment/Plan:  1.  Metastatic adenocarcinoma, likely upper gastrointestinal primary - Liver mass, mass or lymph node conglomerate near the GE junction, gastrohepatic ligament and retroperitoneal lymphadenopathy. -02/11/2022 CTA chest-7.8 x 8.5 cm subtle hypoattenuating mass lesion in segment IV of the  liver. -02/11/2022 CT abdomen/pelvis-8.7 x 7.8 or ill-defined irregular mass in the medial segment left liver, 4.5 x 3.4 cm necrotic mass lesion just cranial to the esophagogastric junction with metastatic lymphadenopathy in the abdomen and pelvis, irregular nodular peritoneal thickening in the lower abdomen and pelvis -02/11/2022 MRI abdomen-rim-enhancing likely necrotic mass or lymph node conglomerate centered around the gastroesophageal junction measuring 5.0 x 3.9 cm, large rim-enhancing likely necrotic liver mass centered in the anterior left lobe of the liver measuring 7.8 x 7.2 cm, enlarged gastrohepatic ligament and retroperitoneal lymph nodes -Labs from 02/11/2022-AFP 2.4, CA 19.9 88, CEA 287 -Ultrasound-guided liver biopsy performed 02/15/2022-adenocarcinoma with extensive necrosis, CK7 positive, negative for CK20, CDX2, TTF-1, and PAX8, consistent with a pancreatic, lung, or upper gastrointestinal primary 2.  Myocardial infarction/cardiac arrest 01/31/2022 3.  Microcytic anemia -Labs from 02/11/2022-ferritin 151, iron 13, percent saturation 5% 4.  DVT/pulmonary embolism-bilateral lower extremity DVTs 01/10/2022, pulmonary emboli on chest CT 01/31/2022 5.  PAD status post AKA in 2021 6.  RA 7.  Bilateral pleural effusions -Ultrasound-guided thoracentesis performed on 02/11/2022.  Cytology negative for malignancy.     Disposition: [name] [name] has been diagnosed with metastatic adenocarcinoma involving a liver mass.  The primary tumor site has not been defined, but I suspect an upper GI primary such as gastroesophageal cancer.  There is a dominant mass in the region of the GE junction.  I discussed the case with gastroenterology.  They do not recommend an elective EGD  for at least 3 months following the myocardial infarction.  I reviewed the CT images with [name] [name] and his friend.  We discussed the differential diagnosis including gastric cancer, gastroesophageal cancer, biliary tract cancer, pancreas  cancer, and metastatic disease from other primary tumor sites.  He will be referred for a staging PET scan to look for a primary tumor site.  We will submit the liver biopsy tissue for Foundation 1, mismatch repair protein, and HER2 testing.  [name] [name] will be referred for Port-A-Cath placement.  He will attend a chemotherapy teaching class.  My initial recommendation is to proceed with FOLFOX chemotherapy.  We reviewed potential toxicities associated with the FOLFOX regimen including the chance of nausea/vomiting, mucositis, diarrhea, alopecia, infection, bleeding, and hematologic toxicity.  We discussed the rash, sun sensitivity, hyperpigmentation, and hand/foot syndrome associated with 5-fluorouracil.  We also discussed the potential for cardiac toxicity with 5-fluorouracil.  We discussed the allergic reaction and various types of neuropathy seen with oxaliplatin.  He agrees to proceed.  He will return for an office visit on 03/14/2022 with a plan to begin FOLFOX on 03/15/2022.  The treatment plan will be modified based on the results of the staging PET scan and additional pathology testing.  Betsy Coder, MD  02/22/2022  1:51 PM

## 2022-02-23 ENCOUNTER — Other Ambulatory Visit: Payer: Self-pay | Admitting: *Deleted

## 2022-02-23 ENCOUNTER — Other Ambulatory Visit: Payer: Self-pay

## 2022-02-23 ENCOUNTER — Telehealth: Payer: Self-pay | Admitting: *Deleted

## 2022-02-23 DIAGNOSIS — C801 Malignant (primary) neoplasm, unspecified: Secondary | ICD-10-CM

## 2022-02-23 NOTE — Telephone Encounter (Signed)
Call from [place], patient care manager, with [facility] to inform pt's doctor of delay start of care. Stated PT will start tomorrow.

## 2022-02-23 NOTE — Telephone Encounter (Signed)
Called his friend, Timmothy Sours who provides all his transportation with following appointments and prep: 03/07/22 PAC at [facility] with check in at 0900 with procedure at 11:00. NPO after midnight and needs driver. 03/08/22: PET scan at [facility] at 12:30 pm with 12:00 arrival. NPO except unflavored water. No gum or candy. He was able to repeat appointments and agrees to inform patient and take him.

## 2022-02-27 ENCOUNTER — Emergency Department (HOSPITAL_COMMUNITY): Payer: Medicare Other

## 2022-02-27 ENCOUNTER — Encounter (HOSPITAL_COMMUNITY): Payer: Self-pay | Admitting: Emergency Medicine

## 2022-02-27 ENCOUNTER — Observation Stay (HOSPITAL_COMMUNITY)
Admission: EM | Admit: 2022-02-27 | Discharge: 2022-02-28 | Disposition: A | Discharge status: Home Health | Payer: Medicare Other | Attending: Internal Medicine | Admitting: Internal Medicine

## 2022-02-27 DIAGNOSIS — R079 Chest pain, unspecified: Secondary | ICD-10-CM | POA: Diagnosis not present

## 2022-02-27 DIAGNOSIS — Z89612 Acquired absence of left leg above knee: Secondary | ICD-10-CM | POA: Insufficient documentation

## 2022-02-27 DIAGNOSIS — Z981 Arthrodesis status: Secondary | ICD-10-CM

## 2022-02-27 DIAGNOSIS — R0602 Shortness of breath: Secondary | ICD-10-CM

## 2022-02-27 DIAGNOSIS — S2243XD Multiple fractures of ribs, bilateral, subsequent encounter for fracture with routine healing: Secondary | ICD-10-CM | POA: Insufficient documentation

## 2022-02-27 DIAGNOSIS — Z95820 Peripheral vascular angioplasty status with implants and grafts: Secondary | ICD-10-CM | POA: Diagnosis not present

## 2022-02-27 DIAGNOSIS — Z79899 Other long term (current) drug therapy: Secondary | ICD-10-CM | POA: Insufficient documentation

## 2022-02-27 DIAGNOSIS — Z7902 Long term (current) use of antithrombotics/antiplatelets: Secondary | ICD-10-CM | POA: Insufficient documentation

## 2022-02-27 DIAGNOSIS — C787 Secondary malignant neoplasm of liver and intrahepatic bile duct: Secondary | ICD-10-CM | POA: Diagnosis not present

## 2022-02-27 DIAGNOSIS — R072 Precordial pain: Secondary | ICD-10-CM | POA: Diagnosis not present

## 2022-02-27 DIAGNOSIS — I639 Cerebral infarction, unspecified: Principal | ICD-10-CM | POA: Insufficient documentation

## 2022-02-27 DIAGNOSIS — R2689 Other abnormalities of gait and mobility: Secondary | ICD-10-CM | POA: Diagnosis not present

## 2022-02-27 DIAGNOSIS — Z8673 Personal history of transient ischemic attack (TIA), and cerebral infarction without residual deficits: Secondary | ICD-10-CM

## 2022-02-27 DIAGNOSIS — I10 Essential (primary) hypertension: Secondary | ICD-10-CM | POA: Insufficient documentation

## 2022-02-27 DIAGNOSIS — Z87891 Personal history of nicotine dependence: Secondary | ICD-10-CM | POA: Insufficient documentation

## 2022-02-27 DIAGNOSIS — X58XXXD Exposure to other specified factors, subsequent encounter: Secondary | ICD-10-CM | POA: Insufficient documentation

## 2022-02-27 DIAGNOSIS — Z7901 Long term (current) use of anticoagulants: Secondary | ICD-10-CM | POA: Diagnosis not present

## 2022-02-27 DIAGNOSIS — I2782 Chronic pulmonary embolism: Secondary | ICD-10-CM | POA: Diagnosis not present

## 2022-02-27 DIAGNOSIS — I2510 Atherosclerotic heart disease of native coronary artery without angina pectoris: Secondary | ICD-10-CM | POA: Diagnosis not present

## 2022-02-27 DIAGNOSIS — G9340 Encephalopathy, unspecified: Secondary | ICD-10-CM

## 2022-02-27 DIAGNOSIS — I2699 Other pulmonary embolism without acute cor pulmonale: Secondary | ICD-10-CM | POA: Diagnosis not present

## 2022-02-27 DIAGNOSIS — R0789 Other chest pain: Secondary | ICD-10-CM | POA: Diagnosis not present

## 2022-02-27 DIAGNOSIS — I469 Cardiac arrest, cause unspecified: Secondary | ICD-10-CM | POA: Diagnosis not present

## 2022-02-27 DIAGNOSIS — R4182 Altered mental status, unspecified: Secondary | ICD-10-CM | POA: Diagnosis present

## 2022-02-27 DIAGNOSIS — R410 Disorientation, unspecified: Secondary | ICD-10-CM

## 2022-02-27 DIAGNOSIS — Z743 Need for continuous supervision: Secondary | ICD-10-CM | POA: Diagnosis not present

## 2022-02-27 DIAGNOSIS — R531 Weakness: Secondary | ICD-10-CM | POA: Insufficient documentation

## 2022-02-27 DIAGNOSIS — J90 Pleural effusion, not elsewhere classified: Secondary | ICD-10-CM | POA: Diagnosis not present

## 2022-02-27 DIAGNOSIS — Z86718 Personal history of other venous thrombosis and embolism: Secondary | ICD-10-CM | POA: Insufficient documentation

## 2022-02-27 DIAGNOSIS — M069 Rheumatoid arthritis, unspecified: Secondary | ICD-10-CM | POA: Diagnosis present

## 2022-02-27 DIAGNOSIS — G9389 Other specified disorders of brain: Secondary | ICD-10-CM | POA: Diagnosis not present

## 2022-02-27 DIAGNOSIS — I739 Peripheral vascular disease, unspecified: Secondary | ICD-10-CM | POA: Diagnosis present

## 2022-02-27 LAB — I-STAT VENOUS BLOOD GAS, ED
Acid-Base Excess: 1 mmol/L (ref 0.0–2.0)
Bicarbonate: 25.4 mmol/L (ref 20.0–28.0)
Calcium, Ion: 1.15 mmol/L (ref 1.15–1.40)
HCT: 30 % — ABNORMAL LOW (ref 39.0–52.0)
Hemoglobin: 10.2 g/dL — ABNORMAL LOW (ref 13.0–17.0)
O2 Saturation: 96 %
Potassium: 3.8 mmol/L (ref 3.5–5.1)
Sodium: 139 mmol/L (ref 135–145)
TCO2: 27 mmol/L (ref 22–32)
pCO2, Ven: 36.9 mmHg — ABNORMAL LOW (ref 44–60)
pH, Ven: 7.446 — ABNORMAL HIGH (ref 7.25–7.43)
pO2, Ven: 81 mmHg — ABNORMAL HIGH (ref 32–45)

## 2022-02-27 LAB — URINALYSIS, ROUTINE W REFLEX MICROSCOPIC
Bilirubin Urine: NEGATIVE
Glucose, UA: NEGATIVE mg/dL
Hgb urine dipstick: NEGATIVE
Ketones, ur: NEGATIVE mg/dL
Leukocytes,Ua: NEGATIVE
Nitrite: NEGATIVE
Protein, ur: NEGATIVE mg/dL
Specific Gravity, Urine: >1.046 — ABNORMAL HIGH (ref 1.005–1.030)
pH: 5 (ref 5.0–8.0)

## 2022-02-27 LAB — RAPID URINE DRUG SCREEN, HOSP PERFORMED
Amphetamines: NOT DETECTED
Barbiturates: NOT DETECTED
Benzodiazepines: NOT DETECTED
Cocaine: NOT DETECTED
Opiates: POSITIVE — AB
Tetrahydrocannabinol: NOT DETECTED

## 2022-02-27 LAB — TROPONIN I (HIGH SENSITIVITY)
Troponin I (High Sensitivity): 30 ng/L — ABNORMAL HIGH (ref <18)
Troponin I (High Sensitivity): 31 ng/L — ABNORMAL HIGH (ref <18)

## 2022-02-27 LAB — CBC
HCT: 35.2 % — ABNORMAL LOW (ref 39.0–52.0)
Hemoglobin: 10.7 g/dL — ABNORMAL LOW (ref 13.0–17.0)
MCH: 25.6 pg — ABNORMAL LOW (ref 26.0–34.0)
MCHC: 30.4 g/dL (ref 30.0–36.0)
MCV: 84.2 fL (ref 80.0–100.0)
Platelets: 152 10*3/uL (ref 150–400)
RBC: 4.18 MIL/uL — ABNORMAL LOW (ref 4.22–5.81)
RDW: 17.6 % — ABNORMAL HIGH (ref 11.5–15.5)
WBC: 6.9 10*3/uL (ref 4.0–10.5)
nRBC: 0 % (ref 0.0–0.2)

## 2022-02-27 LAB — PROTIME-INR
INR: 1 (ref 0.8–1.2)
Prothrombin Time: 13.3 seconds (ref 11.4–15.2)

## 2022-02-27 LAB — AMMONIA: Ammonia: 19 umol/L (ref 9–35)

## 2022-02-27 LAB — BASIC METABOLIC PANEL
Anion gap: 11 (ref 5–15)
BUN: 18 mg/dL (ref 8–23)
CO2: 22 mmol/L (ref 22–32)
Calcium: 9.3 mg/dL (ref 8.9–10.3)
Chloride: 106 mmol/L (ref 98–111)
Creatinine, Ser: 0.88 mg/dL (ref 0.61–1.24)
GFR, Estimated: >60 mL/min (ref >60)
Glucose, Bld: 89 mg/dL (ref 70–99)
Potassium: 4 mmol/L (ref 3.5–5.1)
Sodium: 139 mmol/L (ref 135–145)

## 2022-02-27 LAB — CBG MONITORING, ED: Glucose-Capillary: 102 mg/dL — ABNORMAL HIGH (ref 70–99)

## 2022-02-27 LAB — BRAIN NATRIURETIC PEPTIDE: B Natriuretic Peptide: 161 pg/mL — ABNORMAL HIGH (ref 0.0–100.0)

## 2022-02-27 MED ORDER — FENTANYL CITRATE PF 50 MCG/ML IJ SOSY
50.0000 ug | PREFILLED_SYRINGE | Freq: Once | INTRAMUSCULAR | Status: AC
  Administered 2022-02-27: 50 ug via INTRAVENOUS
  Filled 2022-02-27: qty 1

## 2022-02-27 MED ORDER — IOHEXOL 350 MG/ML SOLN
71.0000 mL | Freq: Once | INTRAVENOUS | Status: AC | PRN
  Administered 2022-02-27: 71 mL via INTRAVENOUS

## 2022-02-27 MED ORDER — LORAZEPAM 1 MG PO TABS
1.0000 mg | ORAL_TABLET | Freq: Once | ORAL | Status: AC
  Administered 2022-02-27: 1 mg via ORAL
  Filled 2022-02-27: qty 1

## 2022-02-27 NOTE — ED Provider Triage Note (Signed)
Emergency Medicine Provider Triage Evaluation Note  [name] [name] , a 61 y.o. male  was evaluated in triage.  Pt complains of CP, DOE, SOB. CP is central/left. Doesn't radiate, no N/V. On eliquis and plavix, no missed doses. .  Review of Systems  Per HPI  Physical Exam  There were no vitals taken for this visit. Gen:   Awake, no distress   Resp:  Normal effort, decreased air movement. MSK:   Moves extremities without difficulty  Other:  +chest wall tenderness  Medical Decision Making  Medically screening exam initiated at 1:02 PM.  Appropriate orders placed.  [name] was informed that the remainder of the evaluation will be completed by another provider, this initial triage assessment does not replace that evaluation, and the importance of remaining in the ED until their evaluation is complete.     Sherrill Raring, PA-C 02/27/22 1303

## 2022-02-27 NOTE — ED Notes (Signed)
Per MD Lawsing can DC q15 min neuro check

## 2022-02-27 NOTE — ED Notes (Signed)
[name] [name] [phone] would like an update asap

## 2022-02-27 NOTE — ED Triage Notes (Signed)
Patient BIB [facility] w/ chest pain and SHOB that started an hour ago. Patient with significant cardiac hx as he has a previous cardiac arrist on the 8th of August. Denies cough, fever, n/v/d. Only endorses rib pain from CPR done to him as well.

## 2022-02-27 NOTE — ED Notes (Signed)
Internal medicine at bedside

## 2022-02-27 NOTE — ED Provider Notes (Signed)
[facility] EMERGENCY DEPARTMENT Provider Note   CSN: [id number] Arrival date & time: 02/27/22  1242     History  Chief Complaint  Patient presents with   Chest Pain    [name] [name] is a 61 y.o. male.   Chest Pain    61 year old male with complex medical history to include lupus, GERD, HTN, rheumatoid arthritis, aortic valve mass, PVD, recent hospitalization for acute respiratory failure with hypoxemia in the setting of pulmonary emboli, multiple fractures of the ribs bilaterally and a sternal fracture due to CPR trauma from a recent hospitalization, CAD, DVT, esophageal mass and liver mass in the setting of metastatic adenocarcinoma with a likely gastrointestinal primary who presents to the emergency department with ongoing chest pain.  The patient endorses shortness of breath, chest wall pain without radiation, left-sided and substernal, some dyspnea on exertion.  He states that he has been on Plavix.  He denies Eliquis use, stating "they took me off that medication and put me on something else but I cannot remember it."  Home Medications Prior to Admission medications   Medication Sig Start Date End Date Taking? Authorizing Provider  apixaban (ELIQUIS) 5 MG TABS tablet Take 1 tablet (5 mg total) by mouth 2 (two) times daily. 02/28/22  Yes Deno Etienne, DO  APIXABAN Arne [place]) VTE STARTER PACK ('10MG'$  AND '5MG'$ ) Take as directed on package: start with two-'5mg'$  tablets twice daily for 7 days. On day 8, switch to one-'5mg'$  tablet twice daily. Patient taking differently: Take 5 mg by mouth 2 (two) times daily. 01/10/22  Yes Kathe Becton R, PA-C  atorvastatin (LIPITOR) 80 MG tablet Take 1 tablet (80 mg total) by mouth daily at 6 PM. 02/20/22  Yes Serita Butcher, MD  clopidogrel (PLAVIX) 75 MG tablet Take 1 tablet (75 mg total) by mouth daily. 07/18/21 07/18/22 Yes Delene Ruffini, MD  diclofenac Sodium (VOLTAREN) 1 % GEL Apply 2-4 g topically in the morning, at noon, in the  evening, and at bedtime. 12/26/21  Yes [provider]  docusate sodium (COLACE) 100 MG capsule Take 1 capsule (100 mg total) by mouth daily. 01/27/21  Yes Madalyn Rob, MD  DULoxetine (CYMBALTA) 30 MG capsule Take 30 mg by mouth daily. 12/26/21  Yes [provider]  gabapentin (NEURONTIN) 100 MG capsule Take 100 mg by mouth at bedtime as needed (for pain).  12/31/19  Yes [provider]  oxyCODONE-acetaminophen (PERCOCET) 10-325 MG tablet Take 1 tablet by mouth every 6 (six) hours as needed for severe pain. 01/23/22  Yes [provider]  lidocaine (LIDODERM) 5 % Place 1 patch onto the skin daily. Remove & Discard patch within 12 hours or as directed by MD For chest wall pain Patient not taking: Reported on 02/22/2022 02/09/22   Florencia Reasons, MD  metoprolol tartrate (LOPRESSOR) 25 MG tablet Take 0.5 tablets (12.5 mg total) by mouth 2 (two) times daily. Patient not taking: Reported on 02/27/2022 02/07/22   Charlynne Cousins, MD  naloxone [facility]) nasal spray 4 mg/0.1 mL Place 1 spray into the nose as needed. Patient not taking: Reported on 02/22/2022 12/26/21   [provider]      Allergies    Dilaudid [hydromorphone hcl], Ramipril, and Tramadol    Review of Systems   Review of Systems  Cardiovascular:  Positive for chest pain.    Physical Exam Updated Vital Signs BP (!) 120/92   Pulse 91   Temp 98 F (36.7 C) (Oral)   Resp 15  SpO2 98%  Physical Exam Vitals and nursing note reviewed.  Constitutional:      General: He is not in acute distress.    Appearance: He is well-developed.  HENT:     Head: Normocephalic and atraumatic.  Eyes:     Conjunctiva/sclera: Conjunctivae normal.  Cardiovascular:     Rate and Rhythm: Normal rate and regular rhythm.     Heart sounds: No murmur heard. Pulmonary:     Effort: Pulmonary effort is normal. No respiratory distress.     Breath sounds: Normal breath sounds.  Chest:     Comments: Tenderness to palpation  along the anterior chest wall and sternum Abdominal:     Palpations: Abdomen is soft.     Tenderness: There is no abdominal tenderness.  Musculoskeletal:        General: No swelling.     Cervical back: Neck supple.  Skin:    General: Skin is warm and dry.     Capillary Refill: Capillary refill takes less than 2 seconds.  Neurological:     Mental Status: He is alert and oriented to person, place, and time.     GCS: GCS eye subscore is 4. GCS verbal subscore is 5. GCS motor subscore is 6.     Cranial Nerves: Cranial nerves 2-12 are intact.     Sensory: Sensation is intact.     Motor: Motor function is intact.     Coordination: Coordination is intact.  Psychiatric:        Mood and Affect: Mood normal.     ED Results / Procedures / Treatments   Labs (all labs ordered are listed, but only abnormal results are displayed) Labs Reviewed  CBC - Abnormal; Notable for the following components:      Result Value   RBC 4.18 (*)    Hemoglobin 10.7 (*)    HCT 35.2 (*)    MCH 25.6 (*)    RDW 17.6 (*)    All other components within normal limits  BRAIN NATRIURETIC PEPTIDE - Abnormal; Notable for the following components:   B Natriuretic Peptide 161.0 (*)    All other components within normal limits  URINALYSIS, ROUTINE W REFLEX MICROSCOPIC - Abnormal; Notable for the following components:   Specific Gravity, Urine >1.046 (*)    All other components within normal limits  RAPID URINE DRUG SCREEN, HOSP PERFORMED - Abnormal; Notable for the following components:   Opiates POSITIVE (*)    All other components within normal limits  CBG MONITORING, ED - Abnormal; Notable for the following components:   Glucose-Capillary 102 (*)    All other components within normal limits  I-STAT VENOUS BLOOD GAS, ED - Abnormal; Notable for the following components:   pH, Ven 7.446 (*)    pCO2, Ven 36.9 (*)    pO2, Ven 81 (*)    HCT 30.0 (*)    Hemoglobin 10.2 (*)    All other components within normal  limits  TROPONIN I (HIGH SENSITIVITY) - Abnormal; Notable for the following components:   Troponin I (High Sensitivity) 30 (*)    All other components within normal limits  TROPONIN I (HIGH SENSITIVITY) - Abnormal; Notable for the following components:   Troponin I (High Sensitivity) 31 (*)    All other components within normal limits  BASIC METABOLIC PANEL  PROTIME-INR  AMMONIA    EKG EKG Interpretation  Date/Time:  Monday February 27 2022 13:05:26 EDT Ventricular Rate:  89 PR Interval:  154 QRS Duration: 98  QT Interval:  380 QTC Calculation: 462 R Axis:   -23 Text Interpretation: Sinus rhythm with occasional Premature ventricular complexes Inferior infarct , age undetermined Possible Anterior infarct , age undetermined Abnormal ECG When compared with ECG of 11-Feb-2022 01:34, PREVIOUS ECG IS PRESENT Confirmed by Regan Lemming ([id number]) on 02/27/2022 3:13:19 PM  Radiology CT Angio Chest PE W and/or Wo Contrast  Result Date: 02/27/2022 CLINICAL DATA:  History of pulmonary embolism, chest pain, shortness of breath EXAM: CT ANGIOGRAPHY CHEST WITH CONTRAST TECHNIQUE: Multidetector CT imaging of the chest was performed using the standard protocol during bolus administration of intravenous contrast. Multiplanar CT image reconstructions and MIPs were obtained to evaluate the vascular anatomy. RADIATION DOSE REDUCTION: This exam was performed according to the departmental dose-optimization program which includes automated exposure control, adjustment of the mA and/or kV according to patient size and/or use of iterative reconstruction technique. CONTRAST:  16m OMNIPAQUE IOHEXOL 350 MG/ML SOLN COMPARISON:  02/11/2022 FINDINGS: Cardiovascular: Coronary artery calcifications are seen. Heart is enlarged in size. RV LV ratio is 1.6. Coronary artery calcifications are seen. There is homogeneous enhancement in thoracic aorta. There are segmental and subsegmental filling defects in left lower lobe, right  upper lobe and right lower lobe. There is interval appearance of small filling defect in segmental branching right lower lobe. There is small thrombus burden. Mediastinum/Nodes: There are enlarged lymph nodes anterior to the right side of heart measuring up to 12 mm in short axis. In the previous study, the no measure 11 mm in short axis. There are a few other subcentimeter nodes in mediastinum with no significant change. Lungs/Pleura: There is interval decrease in bilateral pleural effusions. There is small residual left pleural effusion. There is interval improvement in aeration of lower lung fields suggesting resolving atelectasis/pneumonia. There is no pneumothorax. Upper Abdomen: There is large ill-defined mass lesion in the liver measuring up to 8.5 cm in maximum diameter. This finding has not changed significantly. Findings suggest possible primary or metastatic malignancy in liver. There is mild nodularity and liver surface suggesting possible cirrhosis. Air is seen in intrahepatic bile ducts, possibly suggesting sphincterotomy. Gallbladder fossa is not included in the images. There are possible enlarged lymph nodes along the lesser curvature aspect of the stomach measuring up to 2.2 cm in short axis with interval increase in size. Musculoskeletal: No acute findings are seen. Review of the MIP images confirms the above findings. IMPRESSION: There is pulmonary embolism in segmental and subsegmental branches in left lower lobe and right upper lobe with no significant change. There is interval appearance of intraluminal filling defect in a segmental branch in the right lower lobe. Thrombus burden is small. Right ventricular cavity is more prominent in size in comparison to the left ventricular cavity suggesting right ventricular strain which may be chronic. There is a 8.5 cm ill-defined low-density space-occupying lesion in liver suggesting possible malignant neoplasm. Air in the intrahepatic bile ducts may  suggest sphincterotomy. There is interval increase in size of pericardial lymph nodes and lymph nodes along the lesser curvature aspect of the stomach suggesting progression of metastatic lymphadenopathy. Follow-up PET-CT and tissue sampling as clinically warranted should be considered. Other findings as described in the body of the report. Electronically Signed   By: PElmer PickerM.D.   On: 02/27/2022 19:26   DG Chest 2 View  Result Date: 02/27/2022 CLINICAL DATA:  Chest pain and shortness of breath. EXAM: CHEST - 2 VIEW COMPARISON:  02/11/2022 FINDINGS: The cardiopericardial silhouette is within normal limits  for size. The lungs are clear without focal pneumonia, edema, pneumothorax or pleural effusion. The visualized bony structures of the thorax are unremarkable. IMPRESSION: No active cardiopulmonary disease. Left pleural effusion seen previously has resolved in the interval. Electronically Signed   By: Misty Stanley M.D.   On: 02/27/2022 13:46    Procedures Procedures    Medications Ordered in ED Medications  LORazepam (ATIVAN) tablet 1 mg (has no administration in time range)  fentaNYL (SUBLIMAZE) injection 50 mcg (50 mcg Intravenous Given 02/27/22 1519)  fentaNYL (SUBLIMAZE) injection 50 mcg (50 mcg Intravenous Given 02/27/22 1704)  LORazepam (ATIVAN) tablet 1 mg (1 mg Oral Given 02/27/22 1850)  iohexol (OMNIPAQUE) 350 MG/ML injection 71 mL (71 mLs Intravenous Contrast Given 02/27/22 1909)    ED Course/ Medical Decision Making/ A&P                           Medical Decision Making Amount and/or Complexity of Data Reviewed Labs: ordered. Radiology: ordered.  Risk Prescription drug management.    61 year old male with complex medical history to include lupus, GERD, HTN, rheumatoid arthritis, aortic valve mass, PVD, recent hospitalization for acute respiratory failure with hypoxemia in the setting of pulmonary emboli, multiple fractures of the ribs bilaterally and a sternal fracture  due to CPR trauma from a recent hospitalization, CAD, DVT, esophageal mass and liver mass in the setting of metastatic adenocarcinoma with a likely gastrointestinal primary who presents to the emergency department with ongoing chest pain.  The patient endorses shortness of breath, chest wall pain without radiation, left-sided and substernal, some dyspnea on exertion.  He states that he has been on Plavix.  He denies Eliquis use, stating "they took me off that medication and put me on something else but I cannot remember it."  On arrival, the patient was afebrile, not tachycardic or tachypneic, BP 126/97, saturating 97% on room air.  Sinus rhythm noted on cardiac telemetry.  EKG was performed which revealed evidence of sinus rhythm, occasional PVCs, ventricular rate 89, age-indeterminate infarcts present with no acute ST segment changes to indicate ongoing ischemia.  Patient physical exam does reveal ongoing reproducible chest wall tenderness to palpation in the setting of the patient's known sternal and rib fractures from his prior episode of CPR.  He also has known PE burden.  It is unclear if he is taking his home Eliquis.  Differential diagnosis includes worsening PE burden, continued musculoskeletal pain in the setting of rib fractures and sternal fracture, less likely ACS, pneumothorax, pneumonia, viral infection.  Laboratory work-up significant for UDS positive for opiates, troponins elevated but flat at 30 and 31, BMP unremarkable, CBC unremarkable with mild anemia present, urinalysis without evidence of UTI, CBG 102, ammonia normal, PT/INR normal, BNP moderately nonspecifically elevated to 161.  The patient had an episode of transient confusion in the emergency department.  No clear seizure activity noted.  He on repeat assessment was neuro intact but disoriented to place and time.  This was long after sedating medicines have been administered.  His CT of the chest is as follows: IMPRESSION:   There is pulmonary embolism in segmental and subsegmental branches  in left lower lobe and right upper lobe with no significant change.  There is interval appearance of intraluminal filling defect in a  segmental branch in the right lower lobe. Thrombus burden is small.    Right ventricular cavity is more prominent in size in comparison to  the left ventricular  cavity suggesting right ventricular strain  which may be chronic.    There is a 8.5 cm ill-defined low-density space-occupying lesion in  liver suggesting possible malignant neoplasm. Air in the  intrahepatic bile ducts may suggest sphincterotomy.    There is interval increase in size of pericardial lymph nodes and  lymph nodes along the lesser curvature aspect of the stomach  suggesting progression of metastatic lymphadenopathy. Follow-up  PET-CT and tissue sampling as clinically warranted should be  considered.    Other findings as described in the body of the report.    Given the patient's history of malignancy, transient confusion, some forgetfulness associated with his medicines, considered intermittent episodes of confusion.  No clear etiology at this time besides the patient's opiate use at home.  Considered subclinical seizures, metastatic disease to the brain.  Unfortunately, the patient's confusion occurred after CTA PE imaging was performed and therefore CT head cannot be performed immediately due to the patient's previous contrast administration.  Will order MRI of the brain to further evaluate for TIA, possible metastatic disease.  EEG ordered to evaluate for subclinical seizures.  I did discuss the patient's care with the internal medicine teaching service who did not immediately see an indication for admission at this time.  We will plan to schedule close follow-up in the next day or 2 and in the internal medicine clinic to further discuss the patient's medication regimen to ensure he is remaining compliant with his  Eliquis.  Plan to reassess the patient at time of signout pending imaging, plan for likely discharge home as on further assessment the patient had subsequently returned to his baseline.  Plan of care discussed with the patient bedside.  Signout given to Dr. Tyrone Nine at 2330.   Final Clinical Impression(s) / ED Diagnoses Final diagnoses:  Chest wall pain  SOB (shortness of breath)  Transient confusion  Chronic pulmonary embolism, unspecified pulmonary embolism type, unspecified whether acute cor pulmonale present [facility])  Musculoskeletal chest pain    Rx / DC Orders ED Discharge Orders          Ordered    apixaban (ELIQUIS) 5 MG TABS tablet  2 times daily        02/28/22 0006              Regan Lemming, MD 02/28/22 0116

## 2022-02-27 NOTE — ED Notes (Signed)
Pt arousable to voice and stimulation - when patient woke up he seemed more altered than before - He is alert to name and can tell me his birthday but states he is at the dentist office and does not know why he is at the hospital. Pt states the year is 78. Pt also asking this RN about his cousin asking if he got hurt badly because he boxes a lot - MD Lawsing notified

## 2022-02-27 NOTE — ED Notes (Signed)
Pt refusing CT scan stating "I dont want contrast and I am claustrophobic." Lawsing MD made aware.

## 2022-02-28 ENCOUNTER — Telehealth: Payer: Self-pay | Admitting: Student

## 2022-02-28 ENCOUNTER — Other Ambulatory Visit (HOSPITAL_COMMUNITY): Payer: Self-pay

## 2022-02-28 ENCOUNTER — Telehealth: Payer: Self-pay | Admitting: *Deleted

## 2022-02-28 ENCOUNTER — Emergency Department (HOSPITAL_COMMUNITY): Payer: Medicare Other

## 2022-02-28 ENCOUNTER — Encounter: Payer: Self-pay | Admitting: Oncology

## 2022-02-28 ENCOUNTER — Other Ambulatory Visit (HOSPITAL_COMMUNITY): Payer: Medicare Other

## 2022-02-28 DIAGNOSIS — R0789 Other chest pain: Secondary | ICD-10-CM | POA: Diagnosis not present

## 2022-02-28 DIAGNOSIS — G9389 Other specified disorders of brain: Secondary | ICD-10-CM | POA: Diagnosis not present

## 2022-02-28 DIAGNOSIS — R4182 Altered mental status, unspecified: Secondary | ICD-10-CM

## 2022-02-28 DIAGNOSIS — Z87891 Personal history of nicotine dependence: Secondary | ICD-10-CM | POA: Diagnosis not present

## 2022-02-28 DIAGNOSIS — I639 Cerebral infarction, unspecified: Secondary | ICD-10-CM | POA: Diagnosis not present

## 2022-02-28 LAB — CBC WITH DIFFERENTIAL/PLATELET
Abs Immature Granulocytes: 0.01 10*3/uL (ref 0.00–0.07)
Basophils Absolute: 0.1 10*3/uL (ref 0.0–0.1)
Basophils Relative: 1 %
Eosinophils Absolute: 0.6 10*3/uL — ABNORMAL HIGH (ref 0.0–0.5)
Eosinophils Relative: 10 %
HCT: 33.3 % — ABNORMAL LOW (ref 39.0–52.0)
Hemoglobin: 10.3 g/dL — ABNORMAL LOW (ref 13.0–17.0)
Immature Granulocytes: 0 %
Lymphocytes Relative: 25 %
Lymphs Abs: 1.4 10*3/uL (ref 0.7–4.0)
MCH: 25.9 pg — ABNORMAL LOW (ref 26.0–34.0)
MCHC: 30.9 g/dL (ref 30.0–36.0)
MCV: 83.9 fL (ref 80.0–100.0)
Monocytes Absolute: 0.3 10*3/uL (ref 0.1–1.0)
Monocytes Relative: 6 %
Neutro Abs: 3.2 10*3/uL (ref 1.7–7.7)
Neutrophils Relative %: 58 %
Platelets: 145 10*3/uL — ABNORMAL LOW (ref 150–400)
RBC: 3.97 MIL/uL — ABNORMAL LOW (ref 4.22–5.81)
RDW: 17.3 % — ABNORMAL HIGH (ref 11.5–15.5)
WBC: 5.6 10*3/uL (ref 4.0–10.5)
nRBC: 0 % (ref 0.0–0.2)

## 2022-02-28 LAB — COMPREHENSIVE METABOLIC PANEL
ALT: 44 U/L (ref 0–44)
AST: 48 U/L — ABNORMAL HIGH (ref 15–41)
Albumin: 2.8 g/dL — ABNORMAL LOW (ref 3.5–5.0)
Alkaline Phosphatase: 311 U/L — ABNORMAL HIGH (ref 38–126)
Anion gap: 11 (ref 5–15)
BUN: 13 mg/dL (ref 8–23)
CO2: 22 mmol/L (ref 22–32)
Calcium: 9.1 mg/dL (ref 8.9–10.3)
Chloride: 107 mmol/L (ref 98–111)
Creatinine, Ser: 0.84 mg/dL (ref 0.61–1.24)
GFR, Estimated: >60 mL/min (ref >60)
Glucose, Bld: 87 mg/dL (ref 70–99)
Potassium: 3.8 mmol/L (ref 3.5–5.1)
Sodium: 140 mmol/L (ref 135–145)
Total Bilirubin: 0.5 mg/dL (ref 0.3–1.2)
Total Protein: 7.2 g/dL (ref 6.5–8.1)

## 2022-02-28 LAB — CBG MONITORING, ED
Glucose-Capillary: 85 mg/dL (ref 70–99)
Glucose-Capillary: 93 mg/dL (ref 70–99)

## 2022-02-28 LAB — LIPID PANEL
Cholesterol: 94 mg/dL (ref 0–200)
HDL: 26 mg/dL — ABNORMAL LOW (ref >40)
LDL Cholesterol: 31 mg/dL (ref 0–99)
Total CHOL/HDL Ratio: 3.6 RATIO
Triglycerides: 186 mg/dL — ABNORMAL HIGH (ref <150)
VLDL: 37 mg/dL (ref 0–40)

## 2022-02-28 MED ORDER — ENOXAPARIN SODIUM 40 MG/0.4ML IJ SOSY: 40.0000 mg | PREFILLED_SYRINGE | INTRAMUSCULAR | Status: DC

## 2022-02-28 MED ORDER — OXYCODONE HCL 5 MG PO TABS: 5.0000 mg | ORAL_TABLET | ORAL | Status: DC | PRN

## 2022-02-28 MED ORDER — OXYCODONE-ACETAMINOPHEN 5-325 MG PO TABS: 1.0000 | ORAL_TABLET | Freq: Three times a day (TID) | ORAL | 0 refills | Status: DC | PRN

## 2022-02-28 MED ORDER — OXYCODONE-ACETAMINOPHEN 5-325 MG PO TABS
1.0000 | ORAL_TABLET | Freq: Three times a day (TID) | ORAL | 0 refills | Status: DC | PRN
  Filled 2022-02-28: qty 9, 3d supply, fill #0

## 2022-02-28 MED ORDER — ATORVASTATIN CALCIUM 40 MG PO TABS
80.0000 mg | ORAL_TABLET | Freq: Every day | ORAL | Status: DC
  Filled 2022-02-28: qty 2

## 2022-02-28 MED ORDER — APIXABAN 5 MG PO TABS: 5.0000 mg | ORAL_TABLET | Freq: Two times a day (BID) | ORAL | 0 refills | Status: DC

## 2022-02-28 MED ORDER — OXYCODONE HCL 5 MG PO TABS
5.0000 mg | ORAL_TABLET | Freq: Four times a day (QID) | ORAL | Status: DC | PRN
  Administered 2022-02-28: 5 mg via ORAL
  Filled 2022-02-28: qty 1

## 2022-02-28 MED ORDER — ACETAMINOPHEN 650 MG RE SUPP: 650.0000 mg | Freq: Four times a day (QID) | RECTAL | Status: DC | PRN

## 2022-02-28 MED ORDER — SENNOSIDES-DOCUSATE SODIUM 8.6-50 MG PO TABS: 1.0000 | ORAL_TABLET | Freq: Every evening | ORAL | Status: DC | PRN

## 2022-02-28 MED ORDER — CLOPIDOGREL BISULFATE 75 MG PO TABS
75.0000 mg | ORAL_TABLET | Freq: Every day | ORAL | Status: DC
  Administered 2022-02-28: 75 mg via ORAL
  Filled 2022-02-28: qty 1

## 2022-02-28 MED ORDER — APIXABAN 5 MG PO TABS
5.0000 mg | ORAL_TABLET | Freq: Two times a day (BID) | ORAL | 0 refills | Status: DC
  Filled 2022-02-28: qty 60, 30d supply, fill #0

## 2022-02-28 MED ORDER — DULOXETINE HCL 30 MG PO CPEP
30.0000 mg | ORAL_CAPSULE | Freq: Every day | ORAL | Status: DC
  Administered 2022-02-28: 30 mg via ORAL
  Filled 2022-02-28: qty 1

## 2022-02-28 MED ORDER — LIDOCAINE 5 % EX PTCH: 1.0000 | MEDICATED_PATCH | CUTANEOUS | Status: DC

## 2022-02-28 MED ORDER — APIXABAN 5 MG PO TABS
5.0000 mg | ORAL_TABLET | Freq: Two times a day (BID) | ORAL | Status: DC
  Administered 2022-02-28: 5 mg via ORAL
  Filled 2022-02-28: qty 1

## 2022-02-28 MED ORDER — ACETAMINOPHEN 500 MG PO TABS
1000.0000 mg | ORAL_TABLET | Freq: Once | ORAL | Status: AC
  Administered 2022-02-28: 1000 mg via ORAL
  Filled 2022-02-28: qty 2

## 2022-02-28 MED ORDER — STROKE: EARLY STAGES OF RECOVERY BOOK: Freq: Once | Status: DC

## 2022-02-28 MED ORDER — OXYCODONE HCL 5 MG PO TABS
5.0000 mg | ORAL_TABLET | Freq: Once | ORAL | Status: AC
  Administered 2022-02-28: 5 mg via ORAL
  Filled 2022-02-28: qty 1

## 2022-02-28 MED ORDER — ACETAMINOPHEN 325 MG PO TABS
650.0000 mg | ORAL_TABLET | Freq: Four times a day (QID) | ORAL | Status: DC | PRN
  Administered 2022-02-28: 650 mg via ORAL
  Filled 2022-02-28: qty 2

## 2022-02-28 MED ORDER — LORAZEPAM 1 MG PO TABS
1.0000 mg | ORAL_TABLET | Freq: Once | ORAL | Status: AC
Start: 2022-02-28 — End: 2022-02-28
  Administered 2022-02-28: 1 mg via ORAL
  Filled 2022-02-28: qty 1

## 2022-02-28 NOTE — Progress Notes (Signed)
Per RN, pt headed to MRI.

## 2022-02-28 NOTE — Telephone Encounter (Signed)
Returned call to Maye Hides PT from [facility]. Requesting VO to reevaluate pt for [facility] PT once he is out of the hospital. Verbal auth given. Will route to Red Team for agreement/denial.

## 2022-02-28 NOTE — ED Notes (Signed)
Dr. Lindzen at bedside. 

## 2022-02-28 NOTE — Progress Notes (Signed)
Physical Therapy Evaluation Patient Details Name: [name] [name] MRN: [id number] DOB: 1960-12-18 Today's Date: 02/28/2022  History of Present Illness  61 y.o. male presents to [facility] hospital on 9/4 with chest pain and then had episode of AMS in ED. MRI showed Multiple subcentimeter foci of diffusion abnormality involving supratentorial cerebral white matter as above, suspicious for small evolving acute to early subacute ischemic infarcts as well as multiple remote infarcts. Neurology felt MRI consistent with artifact from old lesions. PMH includes aortic valve mass, HTN, Lupus, PVD, PNA, RA, L AKA. Recent cardiac arrest, STEMI, and PE. Rib and sternal fx's from CPR.  Rrecent esophageal mass and liver mass in setting of metastatic adenocarcinoma.  Clinical Impression  Pt presents to PT with minimal decr in mobility from preadmission primarily due to inactivity over last 24 hours. Recommend return home with resumption of HHPT.          Recommendations for follow up therapy are one component of a multi-disciplinary discharge planning process, led by the attending physician.  Recommendations may be updated based on patient status, additional functional criteria and insurance authorization.  Follow Up Recommendations Home health PT      Assistance Recommended at Discharge Intermittent Supervision/Assistance  Patient can return home with the following  A little help with walking and/or transfers;Assist for transportation    Equipment Recommendations None recommended by PT  Recommendations for Other Services       Functional Status Assessment Patient has not had a recent decline in their functional status     Precautions / Restrictions Precautions Precautions: Fall Precaution Comments: L AKA, prosthesis Restrictions Weight Bearing Restrictions: No      Mobility  Bed Mobility Overal bed mobility: Needs Assistance Bed Mobility: Supine to Sit     Supine to sit: HOB elevated, Min  assist     General bed mobility comments: Min A for elevating trunk into sitting    Transfers Overall transfer level: Needs assistance Equipment used: None Transfers: Bed to chair/wheelchair/BSC       Squat pivot transfers: Min guard     General transfer comment: assist for safety    Ambulation/Gait                  Stairs            Wheelchair Mobility    Modified Rankin (Stroke Patients Only)       Balance Overall balance assessment: Needs assistance Sitting-balance support: No upper extremity supported, Feet supported Sitting balance-Leahy Scale: Good     Standing balance support: Bilateral upper extremity supported, During functional activity Standing balance-Leahy Scale: Poor Standing balance comment: Reliant on UE support                             Pertinent Vitals/Pain Pain Assessment Pain Assessment: Faces Faces Pain Scale: Hurts a little bit Pain Location: ribs and chest Pain Descriptors / Indicators: Guarding, Tender Pain Intervention(s): Monitored during session    Home Living Family/patient expects to be discharged to:: Private residence Living Arrangements: Non-relatives/Friends Available Help at Discharge: Friend(s);Available 24 hours/day Type of Home: House Home Access: Ramped entrance       Home Layout: One level Home Equipment: Tub bench;Grab bars - tub/shower;Rolling Walker (2 wheels);Wheelchair - manual;BSC/3in1;Hand held shower head      Prior Function Prior Level of Function : Needs assist             Mobility Comments: pt reports  ambulating some with RW butr primarily utlizing a wheelchair for houshold mobility over the last 2 months ADLs Comments: friends assist with cooking and cleaning, states he was caring for his own ADLs prior to most recent hospitalization.     Hand Dominance   Dominant Hand: Left    Extremity/Trunk Assessment   Upper Extremity Assessment Upper Extremity Assessment:  Defer to OT evaluation    Lower Extremity Assessment Lower Extremity Assessment: Generalized weakness;LLE deficits/detail LLE Deficits / Details: AKA       Communication   Communication: No difficulties  Cognition Arousal/Alertness: Awake/alert Behavior During Therapy: WFL for tasks assessed/performed Overall Cognitive Status: Within Functional Limits for tasks assessed                                          General Comments General comments (skin integrity, edema, etc.): VSS on RA    Exercises     Assessment/Plan    PT Assessment All further PT needs can be met in the next venue of care  PT Problem List         PT Treatment Interventions      PT Goals (Current goals can be found in the Care Plan section)  Acute Rehab PT Goals PT Goal Formulation: All assessment and education complete, DC therapy    Frequency       Co-evaluation PT/OT/SLP Co-Evaluation/Treatment: Yes Reason for Co-Treatment: Other (comment) (imminent dc) PT goals addressed during session: Mobility/safety with mobility OT goals addressed during session: ADL's and self-care       AM-PAC PT "6 Clicks" Mobility  Outcome Measure Help needed turning from your back to your side while in a flat bed without using bedrails?: A Little Help needed moving from lying on your back to sitting on the side of a flat bed without using bedrails?: A Little Help needed moving to and from a bed to a chair (including a wheelchair)?: A Little Help needed standing up from a chair using your arms (e.g., wheelchair or bedside chair)?: A Little Help needed to walk in hospital room?: Total Help needed climbing 3-5 steps with a railing? : Total 6 Click Score: 14    End of Session   Activity Tolerance: Patient tolerated treatment well Patient left: in chair;with call bell/phone within reach Nurse Communication: Mobility status PT Visit Diagnosis: Other abnormalities of gait and mobility (R26.89)     Time: 7342-8768 PT Time Calculation (min) (ACUTE ONLY): 12 min   Charges:   PT Evaluation $PT Eval Low Complexity: 1 Low          [place] [facility] [place] 02/28/2022, 1:43 PM

## 2022-02-28 NOTE — Procedures (Signed)
Patient Name: [name] [name]  MRN: [id number]  Epilepsy Attending: Lora Havens  Referring Physician/Provider: Regan Lemming, MD Date: 02/28/2022 Duration: 26.07 mins  Patient history: 61yo M with ams. EEG to evaluate for seizure  Level of alertness: Asleep  AEDs during EEG study: None  Technical aspects: This EEG study was done with scalp electrodes positioned according to the 10-20 International system of electrode placement. Electrical activity was reviewed with band pass filter of 1-'70Hz'$ , sensitivity of 7 uV/mm, display speed of 66m/sec with a '60Hz'$  notched filter applied as appropriate. EEG data were recorded continuously and digitally stored.  Video monitoring was available and reviewed as appropriate.  Description: Sleep was characterized by vertex waves, sleep spindles (12 to 14 Hz), maximal frontocentral region. Physiologic photic driving was not seen during photic stimulation. Hyperventilation was not performed.     IMPRESSION: This study during sleep is within normal limits. No seizures or epileptiform discharges were seen throughout the recording.  A normal interictal EEG does not exclude nor support the diagnosis of epilepsy. If suspicion for interictal activity remains a concern, a prolonged study including sleep can be considered.   Rabecca Birge OBarbra Sarks

## 2022-02-28 NOTE — ED Notes (Signed)
Admitting MDs at bedside.

## 2022-02-28 NOTE — ED Notes (Signed)
Pt is requesting pain meds prescription at this time due to rib fractures. Pt states he ran out of refill for pain meds. MD notified. Pending discharge.

## 2022-02-28 NOTE — Progress Notes (Signed)
PT Note  Pt seen for PT eval. Full note to follow. Pt well known to me. Pt able to transfer to chair and is not too far from baseline. Recommend return home with resumption of HHPT services.  [place] Office [phone]

## 2022-02-28 NOTE — ED Notes (Signed)
PT at bedside.

## 2022-02-28 NOTE — Telephone Encounter (Signed)
Please call back to speak with [name] [name] PT from [facility] in reference to VO for 02/24/2022 visit. Pt was seen between 12:30-1:30 pm  PT is aware the pt has now been admitted to the hospital already  Please call back to [phone].

## 2022-02-28 NOTE — ED Notes (Signed)
EEG at bedside.

## 2022-02-28 NOTE — Evaluation (Addendum)
Occupational Therapy Evaluation Patient Details Name: [name] [name] MRN: [id number] DOB: 1961/01/24 Today's Date: 02/28/2022   History of Present Illness 61 y.o. male presents to [facility] hospital on 9/4 with chest pain and then had episode of AMS in ED. MRI showed Multiple subcentimeter foci of diffusion abnormality involving supratentorial cerebral white matter as above, suspicious for small evolving acute to early subacute ischemic infarcts as well as multiple remote infarcts. Neurology felt MRI consistent with artifact from old lesions. PMH includes aortic valve mass, HTN, Lupus, PVD, PNA, RA, L AKA. Recent cardiac arrest, STEMI, and PE. Rib and sternal fx's from CPR.  Rrecent esophageal mass and liver mass in setting of metastatic adenocarcinoma.   Clinical Impression   PTA, pt was living with his roommates and was independent with BADLs; using a w/c or rw (when wearing his prosthetic limb) for mobility. Pt performing near baseline function and requiring Supervision for UB ADLs, Mod A for LB ADLs, and Min guard A for functional transfers. Planning for dc later today. Recommend dc to home with HHOT for further OT to optimize safety, independence with ADLs, and return to PLOF.      Recommendations for follow up therapy are one component of a multi-disciplinary discharge planning process, led by the attending physician.  Recommendations may be updated based on patient status, additional functional criteria and insurance authorization.   Follow Up Recommendations  Home health OT    Assistance Recommended at Discharge Frequent or constant Supervision/Assistance  Patient can return home with the following A lot of help with bathing/dressing/bathroom;Assistance with cooking/housework;Assist for transportation;A lot of help with walking and/or transfers;Help with stairs or ramp for entrance    Functional Status Assessment  Patient has had a recent decline in their functional status and demonstrates the  ability to make significant improvements in function in a reasonable and predictable amount of time.  Equipment Recommendations  None recommended by OT    Recommendations for Other Services       Precautions / Restrictions Precautions Precautions: Fall Precaution Comments: L AKA, prosthesis Restrictions Weight Bearing Restrictions: No      Mobility Bed Mobility Overal bed mobility: Needs Assistance Bed Mobility: Supine to Sit     Supine to sit: HOB elevated, Min assist     General bed mobility comments: Min A for elevating trunk    Transfers Overall transfer level: Needs assistance Equipment used: None Transfers: Bed to chair/wheelchair/BSC     Squat pivot transfers: Min guard       General transfer comment: Min Guard A for safety      Balance Overall balance assessment: Needs assistance Sitting-balance support: No upper extremity supported, Feet supported Sitting balance-Leahy Scale: Fair     Standing balance support: Bilateral upper extremity supported, During functional activity Standing balance-Leahy Scale: Poor Standing balance comment: Reliant on UE support                           ADL either performed or assessed with clinical judgement   ADL Overall ADL's : At baseline Eating/Feeding: Set up;Sitting   Grooming: Set up;Sitting   Upper Body Bathing: Set up;Sitting   Lower Body Bathing: Minimal assistance;Sitting/lateral leans;Sit to/from stand   Upper Body Dressing : Set up;Sitting   Lower Body Dressing: Moderate assistance;Sit to/from stand Lower Body Dressing Details (indicate cue type and reason): Mod A for pulling pants over hips while in standing Toilet Transfer: Min guard;Squat-pivot (simulated to recliner)  Functional mobility during ADLs: Min guard General ADL Comments: Transfer to recliner and then performing dressing task. Pt near baseline function. Requiring assistance for LB dressing due to fatigue and  decreased standing balance     Vision         Perception     Praxis      Pertinent Vitals/Pain Pain Assessment Pain Assessment: Faces Faces Pain Scale: Hurts a little bit Pain Location: ribs and chest Pain Descriptors / Indicators: Guarding, Tender Pain Intervention(s): Monitored during session, Repositioned     Hand Dominance Left   Extremity/Trunk Assessment Upper Extremity Assessment Upper Extremity Assessment: Generalized weakness   Lower Extremity Assessment Lower Extremity Assessment: Defer to PT evaluation       Communication Communication Communication: No difficulties   Cognition Arousal/Alertness: Awake/alert Behavior During Therapy: WFL for tasks assessed/performed Overall Cognitive Status: Within Functional Limits for tasks assessed                                       General Comments  VSS on RA    Exercises     Shoulder Instructions      Home Living Family/patient expects to be discharged to:: Private residence Living Arrangements: Non-relatives/Friends Available Help at Discharge: Friend(s);Available 24 hours/day Type of Home: House Home Access: Ramped entrance     Home Layout: One level     Bathroom Shower/Tub: Teacher, early years/pre: Standard Bathroom Accessibility: Yes   Home Equipment: Tub bench;Grab bars - tub/shower;Rolling Walker (2 wheels);Wheelchair - manual;BSC/3in1;Hand held shower head          Prior Functioning/Environment Prior Level of Function : Needs assist             Mobility Comments: pt reports ambulating some with RW butr primarily utlizing a wheelchair for houshold mobility over the last 2 months ADLs Comments: friends assist with cooking and cleaning, states he was caring for his own ADLs prior to most recent hospitalization.        OT Problem List: Decreased strength;Decreased activity tolerance;Impaired balance (sitting and/or standing);Decreased safety awareness       OT Treatment/Interventions:      OT Goals(Current goals can be found in the care plan section) Acute Rehab OT Goals Patient Stated Goal: Go home OT Goal Formulation: All assessment and education complete, DC therapy  OT Frequency:      Co-evaluation PT/OT/SLP Co-Evaluation/Treatment: Yes Reason for Co-Treatment: Other (comment) (Planning to dc)   OT goals addressed during session: ADL's and self-care      AM-PAC OT "6 Clicks" Daily Activity     Outcome Measure Help from another person eating meals?: None Help from another person taking care of personal grooming?: A Little Help from another person toileting, which includes using toliet, bedpan, or urinal?: A Lot Help from another person bathing (including washing, rinsing, drying)?: A Lot Help from another person to put on and taking off regular upper body clothing?: A Little Help from another person to put on and taking off regular lower body clothing?: A Lot 6 Click Score: 16   End of Session Nurse Communication: Mobility status  Activity Tolerance: Patient tolerated treatment well Patient left: in chair;with call bell/phone within reach  OT Visit Diagnosis: Other abnormalities of gait and mobility (R26.89)                Time: 9449-6759 OT Time Calculation (min): 11 min Charges:  OT General Charges $OT Visit: 1 Visit OT Evaluation $OT Eval Moderate Complexity: 1 Mod  Ellionna Buckbee MSOT, OTR/L Acute Rehab Office: [place] 02/28/2022, 12:40 PM

## 2022-02-28 NOTE — Discharge Planning (Signed)
Pt currently active with [facility] for [place] services as confirmed by RNCM with Marisue [place] of [facility].  Pt will resume [place] services of PT and add OT as recommended by OT department.

## 2022-02-28 NOTE — Hospital Course (Addendum)
9/5- Complains of same chest pain. Other symptoms have resolved. No abd pain. Informed patient about old and new strokes seen on imaging. Informed plan for HFU. Patient will be going to his house today. He is asking about pain medicines. He has been getting meds from [place], but wants notte to pain mgmt dr  about stopping meds. Follows with dr Charlotte Sanes.  Alert and oriented X3.    #Transient altered mental status #Multiple subcortical infarcts After presenting to the ED for chest pain, xxx > x    #Metastatic adenocarcinoma Multiple imaging studies performed on 8-19 demonstrated masses in the patient's liver, esophagogastric junction, and retroperitoneal lymph nodes. Labs were notable for AFP 2.4, CA 19.9, and CEA 287. Liver biopsy of the mass revealed adenocarcinoma consistent with a pancreatic, lung, or upper gastrointestinal primary lesion. with metastatic adenocarcinoma,  > Complete upcoming procedure for Port-A-Cath placement, scheduled 8-12 > Complete upcoming PET scan for staging purposes, scheduled 8-13    #History of pulmonary embolism Chest CT-angio performed 06-2020 revealed evidence of PE and again in 01-2022. After arrival to the ED yesterday 9-4, repeat CT-angio demonstrated an acute small thrombus. Etiology likely hypercoagulability secondary to metastatic adenocarcinoma. Patient was prescribed apixaban, but his adherence is in question due to confusion surrounding multiple medications. His PT and INR are both normal. > xx   #Chest pain #Fractured sternum and ribs Patient underwent CPR prior to his hospitalization on 8-8 for cardiac arrest. Chest CT-angio performed 01-2022 revealed multiple acute anterior rib fractures involving ribs 2 through 7 bilaterally and a nondisplaced mid-sternal fracture. Patient reports persistent chest pain since sustaining these injuries. > x   #Coronary artery disease #Peripheral artery disease #Above-knee amputation Patient found to have  severe bilateral PAD in 01-2019. He underwent left above-knee amputation in 04-2020 and now ambulates with a prosthesis on the LLE. > x    ____________________________________________________________________  States he came to the ED because of chest pain that started yesterday afternoon. States that chest pain felt like the chest pain that he has had since he had CPR about a month ago, but feels like it is getting worse. Chest pain is in the middle of his chest, does not radiate anywhere else. It does hurt more when pressed upon.  During his time in the ED, he was told that he was confused. He does not remember this episode. Denies any dizziness, lightheadedness, headache, fevers, chills, changes in urination or bowel movements. States he has blurry vision every once in a while, but this is chronic (for years), typically lasts for about a week at a time and then spontaneously resolves.  Does endorse numbness in his right foot that has been going on for the past week, on and off. Feels like a pins and needles sensation. Feels like his right leg is swollen.   States that he stopped taking his eliquis last month or so because a doctor had advised him to stop. Does not remember the name of the doctor.   Lives with Octavia Bruckner and Marius Ditch, friends.   Has not drank alcohol in 28 years. Smokes about 40 pack years, but stopped 3 months ago. No recreational drug use.  Uses a walker for ambulation.   Physical Exam: Neuro: AAOx3.   Diminished sensation in 3-5th digits of RLE.  Rheumatoid deformities in bilateral hands.  ___________________________________________________________________

## 2022-02-28 NOTE — Telephone Encounter (Signed)
Patient came in with family after being d/c from [facility] ED. He is requesting a letter for his Pain Management Provider stating we will not give him any more percocet. States he received Rx for percocet after his last hospitalization 3 weeks ago and this broke his contract with Dr. Primus Bravo. He would like letter faxed to Dr. Primus Bravo. States he will be out of percocet tomorrow so he needs letter as soon as possible.  Called Dr. Ethel Rana office for fax number. No answer. Left message to return call with fax number.

## 2022-02-28 NOTE — Care Management Obs Status (Signed)
MEDICARE OBSERVATION STATUS NOTIFICATION   Patient Details  Name: [name] [name] MRN: [id number] Date of Birth: Nov 29, 1960   Medicare Observation Status Notification Given:  Yes    Fuller Mandril, RN 02/28/2022, 1:55 PM

## 2022-02-28 NOTE — H&P (Addendum)
Date: 02/28/2022               Patient Name:  [name] [name] MRN: [id number]  DOB: Sep 21, 1960 Age / Sex: 61 y.o., male   PCP: Angelique Blonder, DO         Medical Service: Internal Medicine Teaching Service         Attending Physician: Dr. Sid Falcon, MD    First Contact: Starlyn Skeans, MD      Pager: [phone]      Second Contact: Iona Beard, MD      Pager: Governor Rooks [phone]           After Hours (After 5p/  First Contact Pager: [phone]  weekends / holidays): Second Contact Pager: [phone]   SUBJECTIVE   Chief Complaint: Altered mental status   History of Present Illness:  [name] [name] is a 61 year old male with a past medical history of CAD, PVD, DVT with multiple PEs, metastatic adenocarcinoma, sternal plus rib fractures, RA, GERD, and hypertension who presented to the ED for worsening chest pain but was found to have an episode of altered mental status during his stay in the ED.  Patient states that worsening chest pain caused him to seek medical attention at the [facility] ED yesterday 9-4 afternoon. He describes this pain as sharp, stabbing, and consistent with the pain that he has been experiencing since receiving CPR prior to his hospitalization for cardiac arrest on 8-8. Subsequent imaging revealed multiple bilateral rib and sternal fractures. The pain is located in the center of his chest, does not radiate anywhere, and worsens when pressed upon.  During the patient's time in the ED, staff noticed that he became acutely confused and disoriented to place-time. When questioned about this episode of confusion later, he could not recall it. He denies experiencing dizziness, lightheadedness, headache, fevers, chills, or any changes to his urinary or bowel patterns. About a week ago, he started to experience numbness and tingling in his right lower extremity. This sensation occurs intermittently and he has also noticed some swelling in that same leg. Additionally, he reports  episodic blurry vision that typically lasts for one week and then spontaneously resolves without intervention. These episodes have persisted for years and he is able to function normally during them, which includes driving.  Of note, the patient stopped taking his apixaban about one month ago. This medication was prescribed in 12-2021 after the patient was found to have multiple PEs. He states that one of his doctors advised him to stop, but was unclear as to why and cannot recall the doctor's name.    ED Course: Upon arrival to the ED, the patient was complaining of chest pain and had normal vital signs. ECG revealed sinus rhythm and occasional PVCs without any evidence of acute ischemia. Physical exam revealed reproducible chest wall tightness overlying known sternal and rib fractures. Laboratory workup was unremarkable, notable only for stable troponins 30-31 and BNP 161. Chest CT-angio revealed small new thrombus in right lower lobe. Brain MRI demonstrated multiple foci suspicious for small acute or early-acute ischemic infarcts, multiple cerebellar infarcts, remote PCA territory infarct, and multiple chronic lacunar infarcts.    Meds:  Current Meds  Medication Sig   apixaban (ELIQUIS) 5 MG TABS tablet Take 1 tablet (5 mg total) by mouth 2 (two) times daily.   APIXABAN (ELIQUIS) VTE STARTER PACK ('10MG'$  AND '5MG'$ ) Take as directed on package: start with two-'5mg'$  tablets twice daily for 7 days. On day 8, switch to  one-'5mg'$  tablet twice daily. (Patient taking differently: Take 5 mg by mouth 2 (two) times daily.)   atorvastatin (LIPITOR) 80 MG tablet Take 1 tablet (80 mg total) by mouth daily at 6 PM.   clopidogrel (PLAVIX) 75 MG tablet Take 1 tablet (75 mg total) by mouth daily.   diclofenac Sodium (VOLTAREN) 1 % GEL Apply 2-4 g topically in the morning, at noon, in the evening, and at bedtime.   docusate sodium (COLACE) 100 MG capsule Take 1 capsule (100 mg total) by mouth daily.   DULoxetine  (CYMBALTA) 30 MG capsule Take 30 mg by mouth daily.   gabapentin (NEURONTIN) 100 MG capsule Take 100 mg by mouth at bedtime as needed (for pain).    oxyCODONE-acetaminophen (PERCOCET) 10-325 MG tablet Take 1 tablet by mouth every 6 (six) hours as needed for severe pain.    Past Medical History  Past Surgical History:  Procedure Laterality Date   ABDOMINAL AORTAGRAM  02/20/2019   ABDOMINAL AORTOGRAM W/LOWER EXTREMITY N/A 02/20/2019   Procedure: ABDOMINAL AORTOGRAM W/LOWER EXTREMITY;  Surgeon: Marty Heck, MD;  Location: [place] [facility];  Service: Cardiovascular;  Laterality: N/A;   ACHILLES TENDON SURGERY Bilateral    AMPUTATION Left 05/11/2020   Procedure: LEFT ABOVE KNEE AMPUTATION;  Surgeon: Angelia Mould, MD;  Location: [place];  Service: Vascular;  Laterality: Left;   ENDARTERECTOMY POPLITEAL Left 05/01/2020   Procedure: ENDARTERECTOMY POPLITEAL;  Surgeon: Waynetta Sandy, MD;  Location: [place];  Service: Vascular;  Laterality: Left;   FASCIOTOMY Left 05/01/2020   Procedure: FASCIOTOMY;  Surgeon: Waynetta Sandy, MD;  Location: [place];  Service: Vascular;  Laterality: Left;   FEMORAL-POPLITEAL BYPASS GRAFT Left 02/26/2019   Procedure: BYPASS GRAFT FEMORAL-POPLITEAL ARTERY LEFT LEG USING 19m PROPATEN GRAFT;  Surgeon: BSerafina Mitchell MD;  [place]  Service: Vascular;  Laterality: Left;   INTRAOPERATIVE ARTERIOGRAM Left 01/22/2020   Procedure: INTRA OPERATIVE ARTERIOGRAM with administration of thrombolyics in Left femoral to popliteal bypass.;  Surgeon: CMarty Heck MD;  [place]  Service: Vascular;  Laterality: Left;   LEFT HEART CATH AND CORONARY ANGIOGRAPHY N/A 03/15/2017   Procedure: LEFT HEART CATH AND CORONARY ANGIOGRAPHY;  Surgeon: ENelva Bush MD;  [place] [facility];  Service: Cardiovascular;  Laterality: N/A;   LEFT HEART CATH AND CORONARY ANGIOGRAPHY N/A 02/06/2022   Procedure: LEFT HEART CATH AND CORONARY  ANGIOGRAPHY;  Surgeon: JMartinique Peter M, MD;  [place] [facility];  Service: Cardiovascular;  Laterality: N/A;   LOWER EXTREMITY INTERVENTION Left 04/29/2020   Procedure: LOWER EXTREMITY INTERVENTION- LYSIS;  Surgeon: CMarty Heck MD;  [facility];  Service: Cardiovascular;  Laterality: Left;   OSTEOTOMY AND ULNAR SHORTENING Right 07/22/2002   PATCH ANGIOPLASTY Left 05/01/2020   Procedure: PATCH ANGIOPLASTY USING HEMASHIELD PLATINUM FINESSE PATCH;  Surgeon: CWaynetta Sandy MD;  [place]  Service: Vascular;  Laterality: Left;   PERIPHERAL VASCULAR ATHERECTOMY  01/23/2020   Procedure: PERIPHERAL VASCULAR ATHERECTOMY;  Surgeon: CMarty Heck MD;  [place] [facility];  Service: Cardiovascular;;   PERIPHERAL VASCULAR BALLOON ANGIOPLASTY  01/23/2020   Procedure: PERIPHERAL VASCULAR BALLOON ANGIOPLASTY;  Surgeon: CMarty Heck MD;  [place] [facility];  Service: Cardiovascular;;   PERIPHERAL VASCULAR INTERVENTION Left 04/30/2020   Procedure: PERIPHERAL VASCULAR INTERVENTION;  Surgeon: HCherre Robins MD;  [place] [facility];  Service: Cardiovascular;  Laterality: Left;   PERIPHERAL VASCULAR THROMBECTOMY N/A  01/23/2020   Procedure: lysis recheck;  Surgeon: Marty Heck, MD;  Location: [place] [facility];  Service: Cardiovascular;  Laterality: N/A;  + [place]    PERIPHERAL VASCULAR THROMBECTOMY N/A 04/30/2020   Procedure: Lysis Recheck;  Surgeon: Cherre Robins, MD;  Location: [place] [facility];  Service: Cardiovascular;  Laterality: N/A;   SHOULDER ARTHROSCOPY WITH BICEPSTENOTOMY Right 03/11/2015   Procedure: SHOULDER ARTHROSCOPY WITH BICEPSTENOTOMY;  Surgeon: Ninetta Lights, MD;  Location: [place];  Service: Orthopedics;  Laterality: Right;   SHOULDER ARTHROSCOPY WITH DISTAL CLAVICLE RESECTION Left 11/19/2014   Procedure: SHOULDER ARTHROSCOPY WITH DISTAL CLAVICLE RESECTION;  Surgeon:  Kathryne Hitch, MD;  Location: [place];  Service: Orthopedics;  Laterality: Left;   SHOULDER ARTHROSCOPY WITH ROTATOR CUFF REPAIR AND SUBACROMIAL DECOMPRESSION Left 11/19/2014   Procedure: LEFT SHOULDER ARTHROSCOPY, DEBRIDEMENT DISTAL CLAVICLE EXCISION, ACROMIOPLASTY WITH ROTATOR CUFF REPAIR ;  Surgeon: Kathryne Hitch, MD;  Location: [place];  Service: Orthopedics;  Laterality: Left;   THROMBECTOMY OF BYPASS GRAFT FEMORAL- POPLITEAL ARTERY Left 05/01/2020   Procedure: THROMBECTOMY OF LOWER EXTREMITY;  Surgeon: Waynetta Sandy, MD;  Location: [place];  Service: Vascular;  Laterality: Left;   TRANSFORAMINAL LUMBAR INTERBODY FUSION (TLIF) WITH PEDICLE SCREW FIXATION 1 LEVEL N/A 01/03/2018   Procedure: TRANSFORAMINAL LUMBAR INTERBODY FUSION (TLIF) LUMBAR FIVE-SACRAL ONE;  Surgeon: Melina Schools, MD;  Location: [place];  Service: Orthopedics;  Laterality: N/A;   ULTRASOUND GUIDANCE FOR VASCULAR ACCESS Right 01/22/2020   Procedure: ULTRASOUND GUIDANCE FOR VASCULAR ACCESS Right Common Femoral Artery.;  Surgeon: Marty Heck, MD;  Location: [place];  Service: Vascular;  Laterality: Right;   VIDEO ASSISTED THORACOSCOPY (VATS)/DECORTICATION Right 11/21/2011   drainage of empyema    Social:  Lives With: friends, Tim and Marius Ditch Support: friends Level of Function: independent in ADLs and IADLs PCP: Dr Markus Jarvis Substances: previously drank alcohol but quit 28 years ago, previously smoked about 40 pack years but quit 2 months age, denies recreational drug use.   Family History:  Family History  Problem Relation Age of Onset   Heart attack Father 46   Alcohol abuse Father    Aneurysm Mother    Stroke Neg Hx    Cancer Neg Hx       Allergies: Allergies as of 02/27/2022 - Review Complete 02/27/2022  Allergen Reaction Noted   Dilaudid [hydromorphone hcl] Rash 05/08/2020   Ramipril Rash and Other (See Comments) 06/23/2011   Tramadol Itching and Rash  06/23/2011    Review of Systems: A complete ROS was negative except as per HPI.   OBJECTIVE:   Physical Exam: Blood pressure 114/84, pulse 93, temperature 97.8 F (36.6 C), resp. rate 19, SpO2 91 %.   General:   lying comfortably in bed, drowsy but arousable, progressively more alert throughout encounter, cooperative, not in acute distress Eyes:   extraocular movements intact, conjunctivae pink, pupils round and reactive to light, no periorbital swelling or scleral icterus Lungs:   normal respiratory effort, breathing unlabored, symmetrical chest rise, no crackles or wheezing Cardiac:   regular rate and rhythm, normal S1 and S2, no pitting edema Abdomen:   soft and non-distended, normoactive bowel sounds present in all four quadrants, no guarding or palpable masses Musculoskeletal:   above-knee amputation on left well-healed, motor strength 5 /5 in all three extremities, grip strength 4 /5 bilaterally, swan neck deformity of fingers with ulnar deviation MCP joints Neurologic:   oriented to person-place and month but  not exact date, moving all extremities, sensation to light touch and pinprick sensation intact, no facial droop, CN II through XII intact Psychiatric:   mood and affect normal, intelligible speech   Labs: CBC    Component Value Date/Time   WBC 6.9 02/27/2022 1333   RBC 4.18 (L) 02/27/2022 1333   HGB 10.2 (L) 02/27/2022 2248   HGB 13.9 03/13/2017 1032   HCT 30.0 (L) 02/27/2022 2248   HCT 41.6 03/13/2017 1032   PLT 152 02/27/2022 1333   PLT 204 03/13/2017 1032   MCV 84.2 02/27/2022 1333   MCV 84 03/13/2017 1032   MCH 25.6 (L) 02/27/2022 1333   MCHC 30.4 02/27/2022 1333   RDW 17.6 (H) 02/27/2022 1333   RDW 13.7 03/13/2017 1032   LYMPHSABS 6.5 (H) 01/31/2022 1306   LYMPHSABS 1.8 03/13/2017 1032   MONOABS 0.5 01/31/2022 1306   EOSABS 0.6 (H) 01/31/2022 1306   EOSABS 0.2 03/13/2017 1032   BASOSABS 0.1 01/31/2022 1306   BASOSABS 0.0 03/13/2017 1032     CMP      Component Value Date/Time   NA 139 02/27/2022 2248   NA 138 01/27/2021 1212   K 3.8 02/27/2022 2248   CL 106 02/27/2022 1333   CO2 22 02/27/2022 1333   GLUCOSE 89 02/27/2022 1333   BUN 18 02/27/2022 1333   BUN 17 01/27/2021 1212   CREATININE 0.88 02/27/2022 1333   CREATININE 0.80 11/27/2012 0956   CALCIUM 9.3 02/27/2022 1333   PROT 6.1 (L) 02/11/2022 0151   PROT 7.1 12/16/2015 0928   ALBUMIN 2.0 (L) 02/11/2022 0151   ALBUMIN 4.2 12/16/2015 0928   AST 29 02/11/2022 0151   ALT 43 02/11/2022 0151   ALKPHOS 195 (H) 02/11/2022 0151   BILITOT 0.5 02/11/2022 0151   BILITOT 0.4 12/16/2015 0928   GFRNONAA >60 02/27/2022 1333   GFRNONAA >89 11/27/2012 0956   GFRAA >60 01/24/2020 0202   GFRAA >89 11/27/2012 0956    Imaging: [name] BRAIN WO CONTRAST  Result Date: 02/28/2022 CLINICAL DATA:  Initial evaluation for mental status change, unknown cause. EXAM: MRI HEAD WITHOUT CONTRAST TECHNIQUE: Multiplanar, multiecho pulse sequences of the brain and surrounding structures were obtained without intravenous contrast. COMPARISON:  Prior CT from 01/31/2022. FINDINGS: Brain: Cerebral volume within normal limits for age. Scattered patchy T2/FLAIR hyperintensity involving the periventricular, deep, and subcortical white matter of both cerebral hemispheres. Patchy involvement of the pons. Findings most likely related chronic microvascular ischemic disease, and are moderately advanced. Few small remote lacunar infarcts present about the right basal ganglia, left thalamus, and pons. Multiple remote right greater than left cerebellar infarcts noted. Encephalomalacia and gliosis involving the left occipital lobe consistent with a chronic left PCA distribution infarct. Associated mild chronic hemosiderin staining at this location. Multiple subtle subcentimeter foci of mild diffusion abnormality are seen involving a deep and subcortical white matter of both cerebral hemispheres (series 5, images 98-89), suspicious for  small evolving acute to early subacute ischemic infarcts. Largest of these measures 7 mm at the left parietal region (series 5, image 89). No associated hemorrhage or mass effect. Otherwise, gray-white matter differentiation maintained. No other areas of chronic cortical infarction. No other acute or chronic intracranial blood products. No mass lesion, midline shift or mass effect. No hydrocephalus or extra-axial fluid collection. Pituitary gland suprasellar region within normal limits. Vascular: Major intracranial vascular flow voids are maintained. Skull and upper cervical spine: Craniocervical junction within normal limits. Bone marrow signal intensity normal. No scalp soft tissue abnormality.  Sinuses/Orbits: Globes orbital soft tissues within normal limits. Few small retention cysts noted about the maxillary sinuses. Paranasal sinuses are otherwise largely clear. Small right mastoid effusion noted, of doubtful significance. Other: None. IMPRESSION: 1. Multiple subcentimeter foci of diffusion abnormality involving supratentorial cerebral white matter as above, suspicious for small evolving acute to early subacute ischemic infarcts. No associated hemorrhage or mass effect. 2. Multiple remote right greater than left cerebellar infarcts, remote left PCA territory infarct, with additional chronic lacunar infarcts involving the right basal ganglia, left thalamus, and pons. 3. Underlying moderately advanced chronic microvascular ischemic disease. Electronically Signed   By: Jeannine Boga M.D.   On: 02/28/2022 03:13   CT Angio Chest PE W and/or Wo Contrast  Result Date: 02/27/2022 CLINICAL DATA:  History of pulmonary embolism, chest pain, shortness of breath EXAM: CT ANGIOGRAPHY CHEST WITH CONTRAST TECHNIQUE: Multidetector CT imaging of the chest was performed using the standard protocol during bolus administration of intravenous contrast. Multiplanar CT image reconstructions and MIPs were obtained to evaluate  the vascular anatomy. RADIATION DOSE REDUCTION: This exam was performed according to the departmental dose-optimization program which includes automated exposure control, adjustment of the mA and/or kV according to patient size and/or use of iterative reconstruction technique. CONTRAST:  22m OMNIPAQUE IOHEXOL 350 MG/ML SOLN COMPARISON:  02/11/2022 FINDINGS: Cardiovascular: Coronary artery calcifications are seen. Heart is enlarged in size. RV LV ratio is 1.6. Coronary artery calcifications are seen. There is homogeneous enhancement in thoracic aorta. There are segmental and subsegmental filling defects in left lower lobe, right upper lobe and right lower lobe. There is interval appearance of small filling defect in segmental branching right lower lobe. There is small thrombus burden. Mediastinum/Nodes: There are enlarged lymph nodes anterior to the right side of heart measuring up to 12 mm in short axis. In the previous study, the no measure 11 mm in short axis. There are a few other subcentimeter nodes in mediastinum with no significant change. Lungs/Pleura: There is interval decrease in bilateral pleural effusions. There is small residual left pleural effusion. There is interval improvement in aeration of lower lung fields suggesting resolving atelectasis/pneumonia. There is no pneumothorax. Upper Abdomen: There is large ill-defined mass lesion in the liver measuring up to 8.5 cm in maximum diameter. This finding has not changed significantly. Findings suggest possible primary or metastatic malignancy in liver. There is mild nodularity and liver surface suggesting possible cirrhosis. Air is seen in intrahepatic bile ducts, possibly suggesting sphincterotomy. Gallbladder fossa is not included in the images. There are possible enlarged lymph nodes along the lesser curvature aspect of the stomach measuring up to 2.2 cm in short axis with interval increase in size. Musculoskeletal: No acute findings are seen. Review  of the MIP images confirms the above findings. IMPRESSION: There is pulmonary embolism in segmental and subsegmental branches in left lower lobe and right upper lobe with no significant change. There is interval appearance of intraluminal filling defect in a segmental branch in the right lower lobe. Thrombus burden is small. Right ventricular cavity is more prominent in size in comparison to the left ventricular cavity suggesting right ventricular strain which may be chronic. There is a 8.5 cm ill-defined low-density space-occupying lesion in liver suggesting possible malignant neoplasm. Air in the intrahepatic bile ducts may suggest sphincterotomy. There is interval increase in size of pericardial lymph nodes and lymph nodes along the lesser curvature aspect of the stomach suggesting progression of metastatic lymphadenopathy. Follow-up PET-CT and tissue sampling as clinically warranted should be  considered. Other findings as described in the body of the report. Electronically Signed   By: Elmer Picker M.D.   On: 02/27/2022 19:26   DG Chest 2 View  Result Date: 02/27/2022 CLINICAL DATA:  Chest pain and shortness of breath. EXAM: CHEST - 2 VIEW COMPARISON:  02/11/2022 FINDINGS: The cardiopericardial silhouette is within normal limits for size. The lungs are clear without focal pneumonia, edema, pneumothorax or pleural effusion. The visualized bony structures of the thorax are unremarkable. IMPRESSION: No active cardiopulmonary disease. Left pleural effusion seen previously has resolved in the interval. Electronically Signed   By: Misty Stanley M.D.   On: 02/27/2022 13:46      EKG: personally reviewed my interpretation is sinus rhythm with occasional PVCs and no signs of acute ischemia.     ASSESSMENT & PLAN:   Assessment & Plan by Problem: Principal Problem:   Stroke [facility]) Active Problems:   Rheumatoid arthritis (HCC)   S/P lumbar fusion, Lower back pain   PAD (peripheral artery disease)  (HCC)   Hx of AKA (above knee amputation), left (HCC)   Pulmonary emboli (HCC)   Multiple fractures of ribs, bilateral, due to CPR trauma   [name] [name] is a 61 y.o. person living with a history of CAD, PVD, DVT with multiple PEs, metastatic adenocarcinoma, sternal plus rib fractures, RA, GERD, and hypertension who presented to the ED for worsening chest pain but was found to have an episode of altered mental status and admitted for suspicion for multiple acute infarcts identified by brain MRI on hospital day 0    #Transient altered mental status #Multiple subcortical infarcts After presenting to the ED for chest pain, patient experienced transient confusion during diagnostic workup. Non-contrast head CT was negative for acute hemorrhage but MRI revealed small new thrombus in right lower lobe. Brain MRI demonstrated multiple foci suspicious for small acute or early-acute ischemic infarcts, multiple cerebellar infarcts, remote PCA territory infarct, and multiple chronic lacunar infarcts. Patient has metastatic adenocarcinoma and a recent history of multiple Pes. He was prescribed apixaban in 12-2021, but stopped taking this medication about one month ago. Infarcts are likely embolic in origin and secondary to hypercoagulability in setting of metastatic adenocarcinoma. While it is unclear whether some or all of these infarcts are acute, further monitoring and evaluation is warranted given the patient's recent episode of altered mental status. > Neurology consult, appreciate recs > Apixaban '5mg'$  q12, atorvastatin '80mg'$  q24, clopidogrel '75mg'$  q24 > Cardiac monitoring, neuro checks q4 > NPO diet, bedside swallow study > CBC, CMP, and lipid panel > PT and OT evaluation   #Metastatic adenocarcinoma Multiple imaging studies performed on 8-19 demonstrated masses in the patient's liver, esophagogastric junction, and retroperitoneal lymph nodes. Labs were notable for AFP 2.4, CA 19.9, and CEA 287. Liver  biopsy of the mass revealed adenocarcinoma consistent with a pancreatic, lung, or upper gastrointestinal primary lesion. Hypercoagulability likely contributing to ischemic infarcts. > Complete upcoming procedure for Port-A-Cath placement, scheduled 8-12 > Complete upcoming PET scan for staging purposes, scheduled 8-13   #History of pulmonary embolism Chest CT-angio performed 06-2020 revealed evidence of PE and again in 01-2022. After arrival to the ED yesterday 9-4, repeat CT-angio demonstrated an acute small thrombus. Etiology likely hypercoagulability secondary to metastatic adenocarcinoma. Patient was prescribed apixaban, but his adherence is in question due to confusion surrounding multiple medications. His PT and INR are both normal. > Apixaban '5mg'$  q12   #Chest pain #Fractured sternum and ribs #Low back pain and history  of cervical radiculopathy Patient underwent CPR prior to his hospitalization on 8-8 for cardiac arrest. Chest CT-angio performed 01-2022 revealed multiple acute anterior rib fractures involving ribs 2 through 7 bilaterally and a nondisplaced mid-sternal fracture. Patient reports persistent chest pain since sustaining these injuries. Additionally, patient has a history of low back pain treated with TLIF L5-S1 in 11-2017 and cervical stenosis. > Acetaminophen '650mg'$  q6 PRN, oxycodone '5mg'$  q6 PRN, duloxetine '30mg'$  q24   #Coronary artery disease #Peripheral artery disease #Above-knee amputation Patient found to have severe bilateral PAD in 01-2019. He underwent left above-knee amputation in 04-2020 and now ambulates with a prosthesis on the LLE. He also uses a walker both inside and outside the home. > Apixaban '5mg'$  q12, atorvastatin '80mg'$  q24, clopidogrel '75mg'$  q24    Diet: NPO VTE: NOAC IVF: None,None Code: Full  Prior to Admission Living Arrangement: Home, living with friends Anticipated Discharge Location: Home Barriers to Discharge: medical management, diagnostic  workup  Dispo: Admit patient to Observation with expected length of stay less than 2 midnights.    Signed: Serita Butcher, MD Internal Medicine Resident PGY-1  02/28/2022, 7:04 AM

## 2022-02-28 NOTE — Discharge Summary (Signed)
Name: [name] [name] MRN: [id number] DOB: February 08, 1961 61 y.o. PCP: [name] Blonder, DO  Date of Admission: 02/27/2022 12:56 PM Date of Discharge: 02/28/2022 Attending Physician: Dr. Daryll [name]  Discharge Diagnosis: Principal Problem:   Stroke [facility]) Active Problems:   Rheumatoid arthritis ([name])   S/P lumbar fusion, Lower back pain   PAD (peripheral artery disease) (HCC)   Hx of AKA (above knee amputation), left ([place])   Pulmonary emboli (HCC)   Multiple fractures of ribs, bilateral, due to CPR trauma    Discharge Medications: Allergies as of 02/28/2022       Reactions   Dilaudid [hydromorphone Hcl] Rash   Ramipril Rash, Other (See Comments)   Per patient on R arm and leg only; no angioedema   Tramadol Itching, Rash        Medication List     STOP taking these medications    Apixaban Starter Pack (81m and 560m Commonly known as: ELIQUIS STARTER PACK Replaced by: Eliquis 5 MG Tabs tablet       TAKE these medications    atorvastatin 80 MG tablet Commonly known as: LIPITOR Take 1 tablet (80 mg total) by mouth daily at 6 PM.   clopidogrel 75 MG tablet Commonly known as: Plavix Take 1 tablet (75 mg total) by mouth daily.   diclofenac Sodium 1 % Gel Commonly known as: VOLTAREN Apply 2-4 g topically in the morning, at noon, in the evening, and at bedtime.   docusate sodium 100 MG capsule Commonly known as: COLACE Take 1 capsule (100 mg total) by mouth daily.   DULoxetine 30 MG capsule Commonly known as: CYMBALTA Take 30 mg by mouth daily.   Eliquis 5 MG Tabs tablet Generic drug: apixaban Take 1 tablet (5 mg total) by mouth 2 (two) times daily. Replaces: Apixaban Starter Pack (1035mnd 5mg75m gabapentin 100 MG capsule Commonly known as: NEURONTIN Take 100 mg by mouth at bedtime as needed (for pain).   lidocaine 5 % Commonly known as: LIDODERM Place 1 patch onto the skin daily. Remove & Discard patch within 12 hours or as directed by MD For chest wall pain    metoprolol tartrate 25 MG tablet Commonly known as: LOPRESSOR Take 0.5 tablets (12.5 mg total) by mouth 2 (two) times daily.   naloxone 4 MG/0.1ML Liqd nasal spray kit Commonly known as: NARCAN Place 1 spray into the nose as needed.   oxyCODONE-acetaminophen 10-325 MG tablet Commonly known as: PERCOCET Take 1 tablet by mouth every 6 (six) hours as needed for severe pain. What changed: Another medication with the same name was added. Make sure you understand how and when to take each.   oxyCODONE-acetaminophen 5-325 MG tablet Commonly known as: Percocet Take 1 tablet by mouth every 8 (eight) hours as needed for severe pain. What changed: You were already taking a medication with the same name, and this prescription was added. Make sure you understand how and when to take each.        Disposition and follow-up:   [name]  At the hospital follow up visit please address:  1.  Follow-up:  A. Transient altered mental status  - Subcortical infarcts    B. Chest pain s/p rib fractures  - Patient expressed concern for not being able to be prescribed pain medication from his pain doctor due to prescriptions from our Internal Medicine clinic   C. Metastatic Adenocarcinoma  D. Acute small pulmonary thrombus  2.  Labs / imaging needed at time of follow-up: CBC  3.  Pending labs/ test needing follow-up: none  Follow-up Appointments: -F/u w/ primary care provider Dr. Angelique [name] at the [facility] on 03/09/22 at 1:15 PM.  Hospital Course by problem list:    [name] [name] is 61 y/o male with past medical history of CAD, PVD, DVT with multiple PEs, metastatic adenocarcinoma, sternal plus rib fractures, RA, GERD, and hypertension that presented w/ worsening chest pain but noted to have AMS and was admitted for further evaluation of AMS.      # Transient  altered mental status # Multiple subcortical infarcts After presenting to the ED for chest pain, patient experienced transient confusion during diagnostic workup. Non-contrast head CT was negative for acute hemorrhage but MRI revealed small new thrombus in right lower lobe. Brain MRI demonstrated multiple foci suspicious for small acute or early-acute ischemic infarcts, multiple cerebellar infarcts, remote PCA territory infarct, and multiple chronic lacunar infarcts. Patient has metastatic adenocarcinoma and a recent history of multiple Pes. He was prescribed apixaban in 12-2021, but stopped taking this medication about one month ago. Infarcts are likely embolic in origin and secondary to hypercoagulability in setting of metastatic adenocarcinoma. Infarcts evaluated due to AMS. Neurology consulted. EEG negative for seizures. Given home Apixaban 30m q12, atorvastatin 86mq24, clopidogrel 7514m24. PT recommended home health PT and OT recommends home OT. Home health PT/OT ordered.     2. # Metastatic adenocarcinoma Multiple imaging studies performed on 8-19 demonstrated masses in the patient's liver, esophagogastric junction, and retroperitoneal lymph nodes. Labs were notable for AFP 2.4, CA 19.9, and CEA 287. Liver biopsy of the mass revealed adenocarcinoma consistent with a pancreatic, lung, or upper gastrointestinal primary lesion. Hypercoagulability likely contributed to ischemic infarcts. Patient has upcoming procedure for Port-A-Cath placement scheduled 9/12. Patient also has upcoming PET scan for staging purposes scheduled 9/13.     3. # History of pulmonary embolism Chest CT-angio performed 06-2020 revealed evidence of PE and again in 01-2022. After arrival to the ED yesterday 9-4, repeat CT-angio demonstrated an acute small thrombus. Etiology likely hypercoagulability secondary to metastatic adenocarcinoma. Patient was prescribed apixaban, but his adherence was in question due to confusion surrounding  multiple medications. His PT and INR are both normal. Discharged on Apixaban 5mg88m2.     4. # Chest pain # Fractured sternum and ribs # Low back pain and history of cervical radiculopathy Patient underwent CPR prior to his hospitalization on 8-8 for cardiac arrest. Chest CT-angio performed 01-2022 revealed multiple acute anterior rib fractures involving ribs 2 through 7 bilaterally and a nondisplaced mid-sternal fracture. Patient reports persistent chest pain since sustaining these injuries. Additionally, patient has a history of low back pain treated with TLIF L5-S1 in 11-2017 and cervical stenosis. Acetaminophen 650mg41mPRN, oxycodone 5mg q7mRN, and duloxetine 30mg q53miven while hospitalized.      5. # Coronary artery disease # Peripheral artery disease # Above-knee amputation Patient found to have severe bilateral PAD in 01-2019. He underwent left above-knee amputation in 04-2020 and now ambulates with a prosthesis on the LLE. He also uses a walker both inside and outside the home. Home Apixaban 5mg q1258mtorvastatin 80mg q2428md clopidogrel 75mg q24 70minued while hospitalized.   Discharge Exam:   BP 118/81   Pulse 85   Temp 98 F (36.7 C) (Oral)   Resp 20   SpO2 93%  General:   lying comfortably in bed, cooperative, not in acute distress Pulmonary:   normal respiratory effort, no wheezes, rales, or rhonchi Cardiac:   regular rate and rhythm, no murmurs, rubs, or gallops. No LE edema Abdomen:   soft and non-distended, normoactive bowel sounds present in all four quadrants, no tenderness Musculoskeletal:  above-knee amputation on left well-healed,  swan neck deformity of fingers with ulnar deviation MCP joints Neurologic:  alert and oriented x 3, moving all extremities, no facial droop, sensation intact, no focal cranial nerve deficit, normal strength Psychiatric:  normal mood and affect, intelligible speech  Pertinent Labs, Studies, and Procedures:     Latest Ref Rng & Units  02/28/2022    6:51 AM 02/27/2022   10:48 PM 02/27/2022    1:33 PM  CBC  WBC 4.0 - 10.5 K/uL 5.6   6.9   Hemoglobin 13.0 - 17.0 g/dL 10.3  10.2  10.7   Hematocrit 39.0 - 52.0 % 33.3  30.0  35.2   Platelets 150 - 400 K/uL 145   152        Latest Ref Rng & Units 02/28/2022    6:51 AM 02/27/2022   10:48 PM 02/27/2022    1:33 PM  CMP  Glucose 70 - 99 mg/dL 87   89   BUN 8 - 23 mg/dL 13   18   Creatinine 0.61 - 1.24 mg/dL 0.84   0.88   Sodium 135 - 145 mmol/L 140  139  139   Potassium 3.5 - 5.1 mmol/L 3.8  3.8  4.0   Chloride 98 - 111 mmol/L 107   106   CO2 22 - 32 mmol/L 22   22   Calcium 8.9 - 10.3 mg/dL 9.1   9.3   Total Protein 6.5 - 8.1 g/dL 7.2     Total Bilirubin 0.3 - 1.2 mg/dL 0.5     Alkaline Phos 38 - 126 U/L 311     AST 15 - 41 U/L 48     ALT 0 - 44 U/L 44       MR BRAIN WO CONTRAST  Result Date: 02/28/2022 CLINICAL DATA:  Initial evaluation for mental status change, unknown cause. EXAM: MRI HEAD WITHOUT CONTRAST TECHNIQUE: Multiplanar, multiecho pulse sequences of the brain and surrounding structures were obtained without intravenous contrast. COMPARISON:  Prior CT from 01/31/2022. FINDINGS: Brain: Cerebral volume within normal limits for age. Scattered patchy T2/FLAIR hyperintensity involving the periventricular, deep, and subcortical white matter of both cerebral hemispheres. Patchy involvement of the pons. Findings most likely related chronic microvascular ischemic disease, and are moderately advanced. Few small remote lacunar infarcts present about the right basal ganglia, left thalamus, and pons. Multiple remote right greater than left cerebellar infarcts noted. Encephalomalacia and gliosis involving the left occipital lobe consistent with a chronic left PCA distribution infarct. Associated mild chronic hemosiderin staining at this location. Multiple subtle subcentimeter foci of mild diffusion abnormality are seen involving a deep and subcortical white matter of both cerebral  hemispheres (series 5, images 98-89), suspicious for small evolving acute to early subacute ischemic infarcts. Largest of these measures 7 mm at the left parietal region (series 5, image 89). No associated hemorrhage or mass effect. Otherwise, gray-white matter differentiation maintained. No other areas of chronic cortical infarction. No other acute or chronic intracranial blood products. No mass lesion, midline shift or mass effect. No hydrocephalus or extra-axial fluid collection. Pituitary gland suprasellar region within normal limits. Vascular: Major intracranial vascular flow voids are  maintained. Skull and upper cervical spine: Craniocervical junction within normal limits. Bone marrow signal intensity normal. No scalp soft tissue abnormality. Sinuses/Orbits: Globes orbital soft tissues within normal limits. Few small retention cysts noted about the maxillary sinuses. Paranasal sinuses are otherwise largely clear. Small right mastoid effusion noted, of doubtful significance. Other: None. IMPRESSION: 1. Multiple subcentimeter foci of diffusion abnormality involving supratentorial cerebral white matter as above, suspicious for small evolving acute to early subacute ischemic infarcts. No associated hemorrhage or mass effect. 2. Multiple remote right greater than left cerebellar infarcts, remote left PCA territory infarct, with additional chronic lacunar infarcts involving the right basal ganglia, left thalamus, and pons. 3. Underlying moderately advanced chronic microvascular ischemic disease. Electronically Signed   By: Jeannine Boga M.D.   On: 02/28/2022 03:13   CT Angio Chest PE W and/or Wo Contrast  Result Date: 02/27/2022 CLINICAL DATA:  History of pulmonary embolism, chest pain, shortness of breath EXAM: CT ANGIOGRAPHY CHEST WITH CONTRAST TECHNIQUE: Multidetector CT imaging of the chest was performed using the standard protocol during bolus administration of intravenous contrast. Multiplanar CT  image reconstructions and MIPs were obtained to evaluate the vascular anatomy. RADIATION DOSE REDUCTION: This exam was performed according to the departmental dose-optimization program which includes automated exposure control, adjustment of the mA and/or kV according to patient size and/or use of iterative reconstruction technique. CONTRAST:  24m OMNIPAQUE IOHEXOL 350 MG/ML SOLN COMPARISON:  02/11/2022 FINDINGS: Cardiovascular: Coronary artery calcifications are seen. Heart is enlarged in size. RV LV ratio is 1.6. Coronary artery calcifications are seen. There is homogeneous enhancement in thoracic aorta. There are segmental and subsegmental filling defects in left lower lobe, right upper lobe and right lower lobe. There is interval appearance of small filling defect in segmental branching right lower lobe. There is small thrombus burden. Mediastinum/Nodes: There are enlarged lymph nodes anterior to the right side of heart measuring up to 12 mm in short axis. In the previous study, the no measure 11 mm in short axis. There are a few other subcentimeter nodes in mediastinum with no significant change. Lungs/Pleura: There is interval decrease in bilateral pleural effusions. There is small residual left pleural effusion. There is interval improvement in aeration of lower lung fields suggesting resolving atelectasis/pneumonia. There is no pneumothorax. Upper Abdomen: There is large ill-defined mass lesion in the liver measuring up to 8.5 cm in maximum diameter. This finding has not changed significantly. Findings suggest possible primary or metastatic malignancy in liver. There is mild nodularity and liver surface suggesting possible cirrhosis. Air is seen in intrahepatic bile ducts, possibly suggesting sphincterotomy. Gallbladder fossa is not included in the images. There are possible enlarged lymph nodes along the lesser curvature aspect of the stomach measuring up to 2.2 cm in short axis with interval increase in  size. Musculoskeletal: No acute findings are seen. Review of the MIP images confirms the above findings. IMPRESSION: There is pulmonary embolism in segmental and subsegmental branches in left lower lobe and right upper lobe with no significant change. There is interval appearance of intraluminal filling defect in a segmental branch in the right lower lobe. Thrombus burden is small. Right ventricular cavity is more prominent in size in comparison to the left ventricular cavity suggesting right ventricular strain which may be chronic. There is a 8.5 cm ill-defined low-density space-occupying lesion in liver suggesting possible malignant neoplasm. Air in the intrahepatic bile ducts may suggest sphincterotomy. There is interval increase in size of pericardial lymph nodes and lymph nodes along the  lesser curvature aspect of the stomach suggesting progression of metastatic lymphadenopathy. Follow-up PET-CT and tissue sampling as clinically warranted should be considered. Other findings as described in the body of the report. Electronically Signed   By: Elmer Picker M.D.   On: 02/27/2022 19:26   DG Chest 2 View  Result Date: 02/27/2022 CLINICAL DATA:  Chest pain and shortness of breath. EXAM: CHEST - 2 VIEW COMPARISON:  02/11/2022 FINDINGS: The cardiopericardial silhouette is within normal limits for size. The lungs are clear without focal pneumonia, edema, pneumothorax or pleural effusion. The visualized bony structures of the thorax are unremarkable. IMPRESSION: No active cardiopulmonary disease. Left pleural effusion seen previously has resolved in the interval. Electronically Signed   By: Misty Stanley M.D.   On: 02/27/2022 13:46     Discharge Instructions:  You were hospitalized for chest pain and altered mental status.    Hospital Course: We found a small blood clot in your lung that may have been the source of your symptoms. We also found some small areas of decreased blood flow in your brain  likely due to small blood clots. This may have also played a role in your altered mental status. We treated your pain with medications and prescribed you a blood thinner (Eliquis) on discharge to decrease the chance of blood clots reoccurring.    Medications:   Please start taking: -Oxycodone-Acetaminophen (percocet) 5-325 (1 tablet by mouth every 8 hours as needed for 3 days) for pain.    Please stop taking: -Apixaban (Eliquis) VTE starter pack; Patient no longer taking   Please continue taking: -Apixaban (Eliquis) 5 mg tablet (1 tablet by mouth 2 times daily) to decrease the risk of blood clots -Atorvastatin (Lipitor) 80 mg tablet (once by mouth daily) for your coronary artery disease -Clopidogrel (Plavix) 75 mg tablet (1 tablet by mouth once daily) for your coronary artery disease -Diclofenac sodium (Voltaren) 1% gel (apply 2-4 g to the skin as needed) for pain -Docusate sodium (Colace) 100 mg capsule (1 capsule by mouth daily) for constipation -Duloxetine (Cymbalta) 30 mg capsule (once by mouth daily) for anxiety or depression -Gabapentin (Neurontin) 100 mg capsule (once by mouth at bedtime as needed) for nerve pain -Lidocaine (Lidoderm) 5% (Place 1 patch on the skin daily) for pain -Metoprolol tartrate (Lopressor) 25 mg tablet (take 1/2 tablet by mouth twice daily) for high blood pressure -Naloxone (Narcan) nasal spray 4 mg / 0.1 mL (place 1 spray into the nostril as needed) for opioid overdose -Oxycodone-acetaminophen (Percocet) 10-325 mg tablet (take 1 tablet by mouth every 6 hours as needed) for pain   Follow-Up: - Please follow up with your primary care provider Dr. Angelique [name] at the [facility] on 03/09/22 at 1:15 PM.   Signed: Starlyn Skeans, MD 02/28/2022, 9:11 AM   Pager: [phone]

## 2022-02-28 NOTE — ED Notes (Signed)
Pt taken to MRI  

## 2022-02-28 NOTE — Progress Notes (Signed)
EEG complete - results pending 

## 2022-02-28 NOTE — Consult Note (Signed)
NEURO HOSPITALIST CONSULT NOTE   Requestig physician: Dr. Cain Sieve  Reason for Consult: Acute onset of confusion  History obtained from:  Patient and Chart     HPI:                                                                                                                                          AHMET SCHANK is an 61 y.o. male with a history of liver/pancreas/esophageal masses, aortic valve mass, CAP, anemia, HTN, recent cardiac arrest, left shoulder impingement syndrome with chronic left shoulder weakness, left AKA, PVD and RA who presented to the ED on Monday afternoon with SOB due to rib pain from recent fractures due to CPR. Of note, he had recent cardiac arrest on August 8.   MRI brain was obtained to assess for confusion, with official read documenting the following: 1. Multiple subcentimeter foci of diffusion abnormality involving supratentorial cerebral white matter as above, suspicious for small evolving acute to early subacute ischemic infarcts. No associated hemorrhage or mass effect. 2. Multiple remote right greater than left cerebellar infarcts, remote left PCA territory infarct, with additional chronic lacunar infarcts involving the right basal ganglia, left thalamus, and pons. 3. Underlying moderately advanced chronic microvascular ischemic disease.   Personal review by Neurology of the MRI pulse sequences reveals the foci of bland DWI hyperintensity to be most consistent with incidental T2 shine through artifact from old deep white matter chronic ischemic lesions seen on the FLAIR images at identical locations. No correlating hypointensity is seen on the ADC maps.   Epic meds list includes apixaban, Plavix and atorvastatin  Past Medical History:  Diagnosis Date   Anemia    Anemia 08/06/2012   Aortic valve mass 12/30/2015   Community acquired pneumonia 11/07/2011   DDD (degenerative disc disease), lumbosacral     and grade 2 slip   GERD  (gastroesophageal reflux disease)    Heart murmur    History of anemia    no current med.   Hypertension    states is borderline on medication; has been on med. x 5-6 yr.   Hypokalemia 05/30/2018   Impingement syndrome of shoulder region 10/2014   left   Insomnia 06/01/2015   Ischemic ulcer of toe of left foot (HCC)    Left shoulder pain 12/16/2015   Low back pain without sciatica 10/29/2009   Lupus ([place])    Peripheral vascular disease (HCC)    Pneumonia    RA (rheumatoid arthritis) ([place])    Rotator cuff tear 11/04/2014    Past Surgical History:  Procedure Laterality Date   ABDOMINAL AORTAGRAM  02/20/2019   ABDOMINAL AORTOGRAM W/LOWER EXTREMITY N/A 02/20/2019   Procedure: ABDOMINAL AORTOGRAM W/LOWER EXTREMITY;  Surgeon: Marty Heck, MD;  Location: [facility];  Service: Cardiovascular;  Laterality: N/A;   ACHILLES TENDON SURGERY Bilateral    AMPUTATION Left 05/11/2020   Procedure: LEFT ABOVE KNEE AMPUTATION;  Surgeon: Angelia Mould, MD;  Location: [facility];  Service: Vascular;  Laterality: Left;   ENDARTERECTOMY POPLITEAL Left 05/01/2020   Procedure: ENDARTERECTOMY POPLITEAL;  Surgeon: Waynetta Sandy, MD;  Location: [facility];  Service: Vascular;  Laterality: Left;   FASCIOTOMY Left 05/01/2020   Procedure: FASCIOTOMY;  Surgeon: Waynetta Sandy, MD;  Location: [facility];  Service: Vascular;  Laterality: Left;   FEMORAL-POPLITEAL BYPASS GRAFT Left 02/26/2019   Procedure: BYPASS GRAFT FEMORAL-POPLITEAL ARTERY LEFT LEG USING 3m PROPATEN GRAFT;  Surgeon: BSerafina Mitchell MD;  [place] [facility]  Service: Vascular;  Laterality: Left;   INTRAOPERATIVE ARTERIOGRAM Left 01/22/2020   Procedure: INTRA OPERATIVE ARTERIOGRAM with administration of thrombolyics in Left femoral to popliteal bypass.;  Surgeon: CMarty Heck MD;  [place] [facility]  Service: Vascular;  Laterality: Left;   LEFT HEART CATH AND CORONARY ANGIOGRAPHY N/A 03/15/2017   Procedure: LEFT HEART  CATH AND CORONARY ANGIOGRAPHY;  Surgeon: ENelva Bush MD;  [facility];  Service: Cardiovascular;  Laterality: N/A;   LEFT HEART CATH AND CORONARY ANGIOGRAPHY N/A 02/06/2022   Procedure: LEFT HEART CATH AND CORONARY ANGIOGRAPHY;  Surgeon: JMartinique Peter M, MD;  [facility];  Service: Cardiovascular;  Laterality: N/A;   LOWER EXTREMITY INTERVENTION Left 04/29/2020   Procedure: LOWER EXTREMITY INTERVENTION- LYSIS;  Surgeon: CMarty Heck MD;  [facility];  Service: Cardiovascular;  Laterality: Left;   OSTEOTOMY AND ULNAR SHORTENING Right 07/22/2002   PATCH ANGIOPLASTY Left 05/01/2020   Procedure: PATCH ANGIOPLASTY USING HEMASHIELD PLATINUM FINESSE PATCH;  Surgeon: CWaynetta Sandy MD;  [place] [facility]  Service: Vascular;  Laterality: Left;   PERIPHERAL VASCULAR ATHERECTOMY  01/23/2020   Procedure: PERIPHERAL VASCULAR ATHERECTOMY;  Surgeon: CMarty Heck MD;  [facility];  Service: Cardiovascular;;   PERIPHERAL VASCULAR BALLOON ANGIOPLASTY  01/23/2020   Procedure: PERIPHERAL VASCULAR BALLOON ANGIOPLASTY;  Surgeon: CMarty Heck MD;  [facility];  Service: Cardiovascular;;   PERIPHERAL VASCULAR INTERVENTION Left 04/30/2020   Procedure: PERIPHERAL VASCULAR INTERVENTION;  Surgeon: HCherre Robins MD;  [facility];  Service: Cardiovascular;  Laterality: Left;   PERIPHERAL VASCULAR THROMBECTOMY N/A 01/23/2020   Procedure: lysis recheck;  Surgeon: CMarty Heck MD;  [facility];  Service: Cardiovascular;  Laterality: N/A;  + [place]    PERIPHERAL VASCULAR THROMBECTOMY N/A 04/30/2020   Procedure: Lysis Recheck;  Surgeon: HCherre Robins MD;  [facility];  Service: Cardiovascular;  Laterality: N/A;   SHOULDER ARTHROSCOPY WITH BICEPSTENOTOMY Right 03/11/2015   Procedure: SHOULDER ARTHROSCOPY WITH BICEPSTENOTOMY;  Surgeon: DNinetta Lights MD;  [place] [facility]  Service: Orthopedics;  Laterality: Right;   SHOULDER ARTHROSCOPY WITH DISTAL CLAVICLE RESECTION Left 11/19/2014   Procedure: SHOULDER ARTHROSCOPY WITH DISTAL CLAVICLE RESECTION;  Surgeon: DKathryne Hitch MD;  [place] [facility]  Service: Orthopedics;  Laterality: Left;   SHOULDER ARTHROSCOPY WITH ROTATOR CUFF REPAIR AND SUBACROMIAL DECOMPRESSION Left 11/19/2014   Procedure: LEFT SHOULDER ARTHROSCOPY, DEBRIDEMENT DISTAL CLAVICLE EXCISION, ACROMIOPLASTY WITH ROTATOR CUFF REPAIR ;  Surgeon: DKathryne Hitch MD;  [place] [facility]  Service: Orthopedics;  Laterality: Left;   THROMBECTOMY OF BYPASS GRAFT FEMORAL- POPLITEAL ARTERY Left 05/01/2020   Procedure: THROMBECTOMY OF LOWER EXTREMITY;  Surgeon: Waynetta Sandy, MD;  Location: [facility];  Service: Vascular;  Laterality: Left;   TRANSFORAMINAL LUMBAR INTERBODY FUSION (TLIF) WITH PEDICLE SCREW FIXATION 1 LEVEL N/A 01/03/2018   Procedure: TRANSFORAMINAL LUMBAR INTERBODY FUSION (TLIF) LUMBAR FIVE-SACRAL ONE;  Surgeon: Melina Schools, MD;  Location: [facility];  Service: Orthopedics;  Laterality: N/A;   ULTRASOUND GUIDANCE FOR VASCULAR ACCESS Right 01/22/2020   Procedure: ULTRASOUND GUIDANCE FOR VASCULAR ACCESS Right Common Femoral Artery.;  Surgeon: Marty Heck, MD;  [facility];  Service: Vascular;  Laterality: Right;   VIDEO ASSISTED THORACOSCOPY (VATS)/DECORTICATION Right 11/21/2011   drainage of empyema    Family History  Problem Relation Age of Onset   Heart attack Father 4   Alcohol abuse Father    Aneurysm Mother    Stroke Neg Hx    Cancer Neg Hx              Social History:  reports that he quit smoking about 3 years ago. His smoking use included cigarettes. He has a 5.00 pack-year smoking history. He has never used smokeless tobacco. He reports that he does not drink alcohol and does not use drugs.  Allergies  Allergen Reactions   Dilaudid  [Hydromorphone Hcl] Rash   Ramipril Rash and Other (See Comments)    Per patient on R arm and leg only; no angioedema   Tramadol Itching and Rash    HOME MEDICATIONS:                                                                                                                      No current facility-administered medications on file prior to encounter.   Current Outpatient Medications on File Prior to Encounter  Medication Sig Dispense Refill   APIXABAN (ELIQUIS) VTE STARTER PACK ('10MG'$  AND '5MG'$ ) Take as directed on package: start with two-'5mg'$  tablets twice daily for 7 days. On day 8, switch to one-'5mg'$  tablet twice daily. (Patient taking differently: Take 5 mg by mouth 2 (two) times daily.) 1 each 0   atorvastatin (LIPITOR) 80 MG tablet Take 1 tablet (80 mg total) by mouth daily at 6 PM. 30 tablet 11   clopidogrel (PLAVIX) 75 MG tablet Take 1 tablet (75 mg total) by mouth daily. 30 tablet 11   diclofenac Sodium (VOLTAREN) 1 % GEL Apply 2-4 g topically in the morning, at noon, in the evening, and at bedtime.     docusate sodium (COLACE) 100 MG capsule Take 1 capsule (100 mg total) by mouth daily. 10 capsule 0   DULoxetine (CYMBALTA) 30 MG capsule Take 30 mg by mouth daily.     gabapentin (NEURONTIN) 100 MG capsule Take 100 mg by mouth at bedtime as needed (for pain).      oxyCODONE-acetaminophen (PERCOCET) 10-325 MG tablet Take 1 tablet by mouth every 6 (six) hours as needed for severe pain.     lidocaine (LIDODERM) 5 % Place 1 patch onto the skin daily. Remove & Discard patch within 12 hours or  as directed by MD For chest wall pain (Patient not taking: Reported on 02/22/2022) 30 patch 0   metoprolol tartrate (LOPRESSOR) 25 MG tablet Take 0.5 tablets (12.5 mg total) by mouth 2 (two) times daily. (Patient not taking: Reported on 02/27/2022) 60 tablet 0   naloxone (NARCAN) nasal spray 4 mg/0.1 mL Place 1 spray into the nose as needed. (Patient not taking: Reported on 02/22/2022)      [START ON  03/01/2022]  stroke: early stages of recovery book   Does not apply Once   apixaban  5 mg Oral BID   atorvastatin  80 mg Oral q1800   clopidogrel  75 mg Oral Daily   DULoxetine  30 mg Oral Daily     ROS:                                                                                                                                       As per HPI.    Blood pressure 107/89, pulse 92, temperature 97.8 F (36.6 C), resp. rate 20, SpO2 97 %.   General Examination:                                                                                                       Physical Exam  HEENT-  [place]/AT   Lungs- Respirations unlabored Extremities- Left AKA  Neurological Examination Mental Status: Alert, oriented, thought content appropriate.  Speech fluent without evidence of aphasia.  Able to follow 3 step commands without difficulty. Cranial Nerves: II: Discs flat bilaterally; Visual fields grossly normal,  III,IV, VI: ptosis not present, extra-ocular motions intact bilaterally pupils equal, round, reactive to light and accommodation V,VII: smile symmetric, facial light touch sensation normal bilaterally VIII: hearing normal bilaterally IX,X: uvula rises symmetrically XI: bilateral shoulder shrug XII: midline tongue extension Motor: Right : Upper extremity   5/5    Left:     Upper extremity   5/5  Lower extremity   5/5     Lower extremity   5/5 Tone and bulk:normal tone throughout; no atrophy noted Sensory: Pinprick and light touch intact throughout, bilaterally Deep Tendon Reflexes: 2+ and symmetric throughout Plantars: Right: downgoing   Left: downgoing Cerebellar: normal finger-to-nose, normal rapid alternating movements and normal heel-to-shin test Gait: normal gait and station   Lab Results: Basic Metabolic Panel: Recent Labs  Lab 02/27/22 1333 02/27/22 2248  NA 139 139  K 4.0 3.8  CL 106  --   CO2 22  --  GLUCOSE 89  --   BUN 18  --   CREATININE 0.88  --   CALCIUM  9.3  --     CBC: Recent Labs  Lab 02/27/22 1333 02/27/22 2248  WBC 6.9  --   HGB 10.7* 10.2*  HCT 35.2* 30.0*  MCV 84.2  --   PLT 152  --     Cardiac Enzymes: No results for input(s): "CKTOTAL", "CKMB", "CKMBINDEX", "TROPONINI" in the last 168 hours.  Lipid Panel: No results for input(s): "CHOL", "TRIG", "HDL", "CHOLHDL", "VLDL", "LDLCALC" in the last 168 hours.  Imaging: MR BRAIN WO CONTRAST  Result Date: 02/28/2022 CLINICAL DATA:  Initial evaluation for mental status change, unknown cause. EXAM: MRI HEAD WITHOUT CONTRAST TECHNIQUE: Multiplanar, multiecho pulse sequences of the brain and surrounding structures were obtained without intravenous contrast. COMPARISON:  Prior CT from 01/31/2022. FINDINGS: Brain: Cerebral volume within normal limits for age. Scattered patchy T2/FLAIR hyperintensity involving the periventricular, deep, and subcortical white matter of both cerebral hemispheres. Patchy involvement of the pons. Findings most likely related chronic microvascular ischemic disease, and are moderately advanced. Few small remote lacunar infarcts present about the right basal ganglia, left thalamus, and pons. Multiple remote right greater than left cerebellar infarcts noted. Encephalomalacia and gliosis involving the left occipital lobe consistent with a chronic left PCA distribution infarct. Associated mild chronic hemosiderin staining at this location. Multiple subtle subcentimeter foci of mild diffusion abnormality are seen involving a deep and subcortical white matter of both cerebral hemispheres (series 5, images 98-89), suspicious for small evolving acute to early subacute ischemic infarcts. Largest of these measures 7 mm at the left parietal region (series 5, image 89). No associated hemorrhage or mass effect. Otherwise, gray-white matter differentiation maintained. No other areas of chronic cortical infarction. No other acute or chronic intracranial blood products. No mass lesion,  midline shift or mass effect. No hydrocephalus or extra-axial fluid collection. Pituitary gland suprasellar region within normal limits. Vascular: Major intracranial vascular flow voids are maintained. Skull and upper cervical spine: Craniocervical junction within normal limits. Bone marrow signal intensity normal. No scalp soft tissue abnormality. Sinuses/Orbits: Globes orbital soft tissues within normal limits. Few small retention cysts noted about the maxillary sinuses. Paranasal sinuses are otherwise largely clear. Small right mastoid effusion noted, of doubtful significance. Other: None. IMPRESSION: 1. Multiple subcentimeter foci of diffusion abnormality involving supratentorial cerebral white matter as above, suspicious for small evolving acute to early subacute ischemic infarcts. No associated hemorrhage or mass effect. 2. Multiple remote right greater than left cerebellar infarcts, remote left PCA territory infarct, with additional chronic lacunar infarcts involving the right basal ganglia, left thalamus, and pons. 3. Underlying moderately advanced chronic microvascular ischemic disease. Electronically Signed   By: Jeannine Boga M.D.   On: 02/28/2022 03:13   CT Angio Chest PE W and/or Wo Contrast  Result Date: 02/27/2022 CLINICAL DATA:  History of pulmonary embolism, chest pain, shortness of breath EXAM: CT ANGIOGRAPHY CHEST WITH CONTRAST TECHNIQUE: Multidetector CT imaging of the chest was performed using the standard protocol during bolus administration of intravenous contrast. Multiplanar CT image reconstructions and MIPs were obtained to evaluate the vascular anatomy. RADIATION DOSE REDUCTION: This exam was performed according to the departmental dose-optimization program which includes automated exposure control, adjustment of the mA and/or kV according to patient size and/or use of iterative reconstruction technique. CONTRAST:  78m OMNIPAQUE IOHEXOL 350 MG/ML SOLN COMPARISON:  02/11/2022  FINDINGS: Cardiovascular: Coronary artery calcifications are seen. Heart is enlarged in size. RV LV  ratio is 1.6. Coronary artery calcifications are seen. There is homogeneous enhancement in thoracic aorta. There are segmental and subsegmental filling defects in left lower lobe, right upper lobe and right lower lobe. There is interval appearance of small filling defect in segmental branching right lower lobe. There is small thrombus burden. Mediastinum/Nodes: There are enlarged lymph nodes anterior to the right side of heart measuring up to 12 mm in short axis. In the previous study, the no measure 11 mm in short axis. There are a few other subcentimeter nodes in mediastinum with no significant change. Lungs/Pleura: There is interval decrease in bilateral pleural effusions. There is small residual left pleural effusion. There is interval improvement in aeration of lower lung fields suggesting resolving atelectasis/pneumonia. There is no pneumothorax. Upper Abdomen: There is large ill-defined mass lesion in the liver measuring up to 8.5 cm in maximum diameter. This finding has not changed significantly. Findings suggest possible primary or metastatic malignancy in liver. There is mild nodularity and liver surface suggesting possible cirrhosis. Air is seen in intrahepatic bile ducts, possibly suggesting sphincterotomy. Gallbladder fossa is not included in the images. There are possible enlarged lymph nodes along the lesser curvature aspect of the stomach measuring up to 2.2 cm in short axis with interval increase in size. Musculoskeletal: No acute findings are seen. Review of the MIP images confirms the above findings. IMPRESSION: There is pulmonary embolism in segmental and subsegmental branches in left lower lobe and right upper lobe with no significant change. There is interval appearance of intraluminal filling defect in a segmental branch in the right lower lobe. Thrombus burden is small. Right ventricular cavity  is more prominent in size in comparison to the left ventricular cavity suggesting right ventricular strain which may be chronic. There is a 8.5 cm ill-defined low-density space-occupying lesion in liver suggesting possible malignant neoplasm. Air in the intrahepatic bile ducts may suggest sphincterotomy. There is interval increase in size of pericardial lymph nodes and lymph nodes along the lesser curvature aspect of the stomach suggesting progression of metastatic lymphadenopathy. Follow-up PET-CT and tissue sampling as clinically warranted should be considered. Other findings as described in the body of the report. Electronically Signed   By: Elmer Picker M.D.   On: 02/27/2022 19:26   DG Chest 2 View  Result Date: 02/27/2022 CLINICAL DATA:  Chest pain and shortness of breath. EXAM: CHEST - 2 VIEW COMPARISON:  02/11/2022 FINDINGS: The cardiopericardial silhouette is within normal limits for size. The lungs are clear without focal pneumonia, edema, pneumothorax or pleural effusion. The visualized bony structures of the thorax are unremarkable. IMPRESSION: No active cardiopulmonary disease. Left pleural effusion seen previously has resolved in the interval. Electronically Signed   By: Misty Stanley M.D.   On: 02/27/2022 13:46     Assessment: 61 y.o. male with a complex PMHx including recent cardiac arrest, PVD, PE, liver/pancreas/esophageal masses, aortic valve mass and HTN, who presented to the ED on Monday afternoon with SOB due to rib pain from recent fractures due to CPR. MRI brain was obtained to assess for confusion, revealing multiple subcentimeter foci of mildly hyperintense DWI signal as well as multiple remote right greater than left cerebellar infarcts and a remote left PCA territory infarct, with additional chronic lacunar infarcts involving the right basal ganglia, left thalamus, and pons, as well as underlying moderately advanced chronic microvascular ischemic disease.  - Neurological  exam reveals normal speech, orientation and mentation. Has LUE proximal weakness secondary to chronic structural lesion to left  rotator cuff.  - Personal review by Neurology of the MRI pulse sequences reveals the foci of bland DWI hyperintensity to be most consistent with incidental T2 shine through artifact from old deep white matter chronic ischemic lesions seen on the FLAIR images at identical locations. No correlating hypointensity is seen on the ADC maps.  - On apixaban as an outpatient. History of PE.  - Also on Plavix and atorvastatin at home. Has known PVD.   Recommendations: - Primary team has continued apixaban in the context of his history of PE and DVT - Continue Plavix - Continue atorvastatin - Close neurological monitoring - Treatment of rib fractures and SOB per primary team - Neurology will follow on a PRN basis. Please contact us if there are additional questions.    Electronically signed: Dr. Kerney Elbe 02/28/2022, 6:08 AM

## 2022-02-28 NOTE — Discharge Instructions (Addendum)
You were hospitalized for chest pain and altered mental status.   Hospital Course: We found a small blood clot in your lung that may have been the source of your symptoms. We also found some small areas of decreased blood flow in your brain likely due to small blood clots. This may have also played a role in your altered mental status. We treated your pain with medications and prescribed you a blood thinner (Eliquis) on discharge to decrease the chance of blood clots reoccurring.   Medications:  Please start taking: -Oxycodone-Acetaminophen (percocet) 5-325 (1 tablet by mouth every 8 hours as needed for 3 days) for pain.   Please stop taking: -Apixaban (Eliquis) VTE starter pack; Patient no longer taking  Please continue taking: -Apixaban (Eliquis) 5 mg tablet (1 tablet by mouth 2 times daily) to decrease the risk of blood clots -Atorvastatin (Lipitor) 80 mg tablet (once by mouth daily) for your coronary artery disease -Clopidogrel (Plavix) 75 mg tablet (1 tablet by mouth once daily) for your coronary artery disease -Diclofenac sodium (Voltaren) 1% gel (apply 2-4 g to the skin as needed) for pain -Docusate sodium (Colace) 100 mg capsule (1 capsule by mouth daily) for constipation -Duloxetine (Cymbalta) 30 mg capsule (once by mouth daily) for anxiety or depression -Gabapentin (Neurontin) 100 mg capsule (once by mouth at bedtime as needed) for nerve pain -Lidocaine (Lidoderm) 5% (Place 1 patch on the skin daily) for pain -Metoprolol tartrate (Lopressor) 25 mg tablet (take 1/2 tablet by mouth twice daily) for high blood pressure -Naloxone (Narcan) nasal spray 4 mg / 0.1 mL (place 1 spray into the nostril as needed) for opioid overdose -Oxycodone-acetaminophen (Percocet) 10-325 mg tablet (take 1 tablet by mouth every 6 hours as needed) for pain  Follow-Up: - Please follow up with your primary care provider Dr. Angelique Blonder at the [facility] on 03/09/22 at 1:15 PM.

## 2022-03-01 ENCOUNTER — Telehealth: Payer: Self-pay

## 2022-03-01 ENCOUNTER — Encounter: Payer: Self-pay | Admitting: Student in an Organized Health Care Education/Training Program

## 2022-03-01 NOTE — Patient Outreach (Signed)
  Care Coordination [facility] Note Transition Care Management Follow-up Telephone Call Date of discharge and from where: [facility] How have you been since you were released from the hospital? "My ribs are still killing me." Any questions or concerns? Yes- Patient concerned that the clinic needs to send a note to his pain management specialist, Dr. Primus Bravo, as he broke the pain management contract.  Called Dr. Ethel Rana office at [phone] and obtained fax number for the [place].  Discussed with Howell Rucks, RN who noted Dr. Evette Doffing is creating note and plan to fax to Dr. Primus Bravo when completed.  Items Reviewed: Did the pt receive and understand the discharge instructions provided? Yes  Medications obtained and verified? Yes  Other? No  Any new allergies since your discharge? No  Dietary orders reviewed? Yes Do you have support at home? Yes   Home Care and Equipment/Supplies: Were home health services ordered? yes If so, what is the name of the agency? [place] Has the agency set up a time to come to the patient's home? yes Were any new equipment or medical supplies ordered?  No What is the name of the medical supply agency? N/A Were you able to get the supplies/equipment? not applicable Do you have any questions related to the use of the equipment or supplies? No  Functional Questionnaire: (I = Independent and D = Dependent) ADLs: I  Bathing/Dressing- I  Meal Prep- I  Eating- I  Maintaining continence- I  Transferring/Ambulation- I  Managing Meds- I  Follow up appointments reviewed:  PCP Hospital f/u appt confirmed? Yes  Scheduled to see Dr. Humphrey Rolls on 03/02/22 @ 1:15. [facility] f/u appt confirmed? Yes  Scheduled to see [place] on 03/03/22 @ 10:00. Are transportation arrangements needed? No  If their condition worsens, is the pt aware to call PCP or go to the Emergency Dept.? Yes Was the patient provided with contact  information for the PCP's office or ED? Yes Was to pt encouraged to call back with questions or concerns? Yes  SDOH assessments and interventions completed:   Yes  Care Coordination Interventions Activated:  Yes   Care Coordination Interventions:   Collaborated with Howell Rucks RN to obtain letter for pain mgmt specialist.    Encounter Outcome:  Pt. Visit Completed

## 2022-03-01 NOTE — Telephone Encounter (Signed)
Letter complete.

## 2022-03-01 NOTE — Progress Notes (Unsigned)
CC: Hospital follow up  HPI:  [name] is a 61 y.o. with medical history of HTN, HLD, left AKA, RA, Hx of PE on AC, Undifferentiated metastatic GI adenocarcinoma presenting to [facility] for hospital follow up.   Please see problem-based list for further details, assessments, and plans.  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   Hypertension    states is borderline on medication; has been on med. x 5-6 yr.   Hypokalemia 05/30/2018   Impingement syndrome of shoulder region 10/2014   left   Insomnia 06/01/2015   Ischemic ulcer of toe of left foot (HCC)    Left shoulder pain 12/16/2015   Low back pain without sciatica 10/29/2009   Lupus ([place])    Peripheral vascular disease (HCC)    Pneumonia    RA (rheumatoid arthritis) ([place])    Rotator cuff tear 11/04/2014     Current Outpatient Medications (Cardiovascular):    atorvastatin (LIPITOR) 80 MG tablet, Take 1 tablet (80 mg total) by mouth daily at 6 PM.   metoprolol tartrate (LOPRESSOR) 25 MG tablet, Take 0.5 tablets (12.5 mg total) by mouth 2 (two) times daily. (Patient not taking: Reported on 02/27/2022)   Current Outpatient Medications (Analgesics):    oxyCODONE-acetaminophen (PERCOCET) 10-325 MG tablet, Take 1 tablet by mouth every 6 (six) hours as needed for severe pain.   oxyCODONE-acetaminophen (PERCOCET) 5-325 MG tablet, Take 1 tablet by mouth every 8 (eight) hours as needed for up to 3 days for severe pain.  Current Outpatient Medications (Hematological):    apixaban (ELIQUIS) 5 MG TABS tablet, Take 1 tablet (5 mg total) by mouth 2 (two) times daily.   clopidogrel (PLAVIX) 75 MG tablet, Take 1 tablet (75 mg total) by mouth daily.  Current Outpatient Medications  (Other):    diclofenac Sodium (VOLTAREN) 1 % GEL, Apply 2-4 g topically in the morning, at noon, in the evening, and at bedtime.   docusate sodium (COLACE) 100 MG capsule, Take 1 capsule (100 mg total) by mouth daily.   DULoxetine (CYMBALTA) 30 MG capsule, Take 30 mg by mouth daily.   gabapentin (NEURONTIN) 100 MG capsule, Take 100 mg by mouth at bedtime as needed (for pain).    lidocaine (LIDODERM) 5 %, Place 1 patch onto the skin daily. Remove & Discard patch within 12 hours or as directed by MD For chest wall pain (Patient not taking: Reported on 02/22/2022)   naloxone (NARCAN) nasal spray 4 mg/0.1 mL, Place 1 spray into the nose as needed. (Patient not taking: Reported on 02/22/2022)  Review of Systems:  Review of system negative unless stated in the problem list or HPI.    Physical Exam:  Vitals:   03/02/22 1321  BP: 135/83  Pulse: 98  Temp: 98.3 F (36.8 C)  TempSrc: Oral  SpO2: 98%  Height: 5' 7" (1.702 m)    Physical Exam General: NAD HENT: NCAT Lungs: CTAB, no wheeze, rhonchi or rales.  Cardiovascular: Normal heart sounds, no r/m/g, 2+ pulses in all extremities. No LE edema Abdomen: No TTP, normal bowel sounds MSK: No asymmetry or muscle atrophy.  Skin: no lesions noted on exposed skin Neuro: Alert  and oriented x4. CN grossly intact Psych: Normal mood and normal affect   Assessment & Plan:   No problem-specific Assessment & Plan notes found for this encounter.   See Encounters Tab for problem based charting.  Patient discussed with Dr. Denzil Magnuson, MD Tillie Rung. [facility] Internal Medicine Residency, PGY-2    1.  Follow-up:             A. Transient altered mental status             - Subcortical infarcts                          B. Chest pain s/p rib fractures On oxycodone. Has been getting oxycodone for pain for his arthritis previously. Sees pain clinic.  Can increase cymbalta to twice a day.              C. Metastatic Adenocarcinoma               -Has follow up with oncology.  Tumor markers were significant for AFP wnl, CEA elevated to 287,CA 19-9 elevated to 88. IR biopsied the liver lesion on 8/23 with a technically successful biopsy. Ultrasound-guided liver biopsy performed 02/15/2022-adenocarcinoma with extensive necrosis, CK7 positive, negative for CK20, CDX2, TTF-1, and PAX8, consistent with a pancreatic, lung, or upper gastrointestinal primary No EGD for 3 months after MI.   Liver biopsy tissue submitted for Foundation 1, mismatch repair protein, and HER2 testing. Most likely to undergo FolFox therapy.  He will return for an office visit on 03/14/2022 with a plan to begin FOLFOX on 03/15/2022.              D. Acute small pulmonary thrombus    -On AC.   2.  Labs / imaging needed at time of follow-up: CBC

## 2022-03-01 NOTE — Patient Outreach (Signed)
  Care Coordination   Note   03/01/2022 Name: [name] [name] MRN: [id number] DOB: Jan 14, 1961  Received letter for Dr. Primus Bravo, pain management, which Dr. Evette Doffing created and this writer faxed to Dr. Primus Bravo at [phone].  Received fax successful confirmation .  Reached out to patient and let him know the letter was created and faxed.  Patient expressed gratitude to Dr. Evette Doffing and staff. [name] Killian, RN, BSN, CCM Care Management Coordinator [facility] Phone: [phone]: [phone]

## 2022-03-02 ENCOUNTER — Other Ambulatory Visit: Payer: Self-pay

## 2022-03-02 ENCOUNTER — Ambulatory Visit (INDEPENDENT_AMBULATORY_CARE_PROVIDER_SITE_OTHER): Payer: Medicare Other | Admitting: Internal Medicine

## 2022-03-02 ENCOUNTER — Encounter: Payer: Self-pay | Admitting: Internal Medicine

## 2022-03-02 ENCOUNTER — Other Ambulatory Visit: Payer: Self-pay | Admitting: Radiology

## 2022-03-02 DIAGNOSIS — C801 Malignant (primary) neoplasm, unspecified: Secondary | ICD-10-CM | POA: Diagnosis not present

## 2022-03-02 DIAGNOSIS — R0789 Other chest pain: Secondary | ICD-10-CM | POA: Diagnosis not present

## 2022-03-02 DIAGNOSIS — Z0000 Encounter for general adult medical examination without abnormal findings: Secondary | ICD-10-CM

## 2022-03-02 DIAGNOSIS — I2699 Other pulmonary embolism without acute cor pulmonale: Secondary | ICD-10-CM

## 2022-03-02 MED ORDER — OXYCODONE-ACETAMINOPHEN 10-325 MG PO TABS: 1.0000 | ORAL_TABLET | Freq: Four times a day (QID) | ORAL | 0 refills | Status: DC | PRN

## 2022-03-02 NOTE — Patient Instructions (Addendum)
[name], it was a pleasure seeing you today! You endorsed feeling well today. Below are some of the things we talked about this visit. We look forward to seeing you in the follow up appointment!  Today we discussed: You presented for a follow up after the hospitalization. You were hospitalized because you were having pain. Your pain doctor is retiring and wants you to establish care somewhere else. Since you have a recent cancer diagnosis, we are able to take over your pain contract as it will be more convenient for you.  Continue to follow with your oncologist. We look forward to see you again in one month and will assist you in any way during this journey.   I have ordered the following labs today:  Lab Orders  No laboratory test(s) ordered today      Referrals ordered today:   Referral Orders  No referral(s) requested today     I have ordered the following medication/changed the following medications:   Stop the following medications: There are no discontinued medications.   Start the following medications: No orders of the defined types were placed in this encounter.    Follow-up: 1 month follow up  Please make sure to arrive 15 minutes prior to your next appointment. If you arrive late, you may be asked to reschedule.   We look forward to seeing you next time. Please call our clinic at [phone] if you have any questions or concerns. The best time to call is Monday-Friday from 9am-4pm, but there is someone available 24/7. If after hours or the weekend, call the main hospital number and ask for the Internal Medicine Resident On-Call. If you need medication refills, please notify your pharmacy one week in advance and they will send [place] a request.  Thank you for letting us take part in your care. Wishing you the best!  Thank you, Idamae Schuller, MD

## 2022-03-03 ENCOUNTER — Other Ambulatory Visit: Payer: Self-pay

## 2022-03-03 ENCOUNTER — Other Ambulatory Visit: Payer: Self-pay | Admitting: *Deleted

## 2022-03-03 ENCOUNTER — Inpatient Hospital Stay: Payer: Medicare Other

## 2022-03-03 ENCOUNTER — Inpatient Hospital Stay: Payer: Medicare Other | Attending: Oncology

## 2022-03-03 DIAGNOSIS — Z86718 Personal history of other venous thrombosis and embolism: Secondary | ICD-10-CM | POA: Insufficient documentation

## 2022-03-03 DIAGNOSIS — C159 Malignant neoplasm of esophagus, unspecified: Secondary | ICD-10-CM | POA: Diagnosis not present

## 2022-03-03 DIAGNOSIS — Z86711 Personal history of pulmonary embolism: Secondary | ICD-10-CM | POA: Diagnosis not present

## 2022-03-03 DIAGNOSIS — D509 Iron deficiency anemia, unspecified: Secondary | ICD-10-CM | POA: Insufficient documentation

## 2022-03-03 DIAGNOSIS — C801 Malignant (primary) neoplasm, unspecified: Secondary | ICD-10-CM

## 2022-03-03 DIAGNOSIS — C787 Secondary malignant neoplasm of liver and intrahepatic bile duct: Secondary | ICD-10-CM | POA: Insufficient documentation

## 2022-03-03 LAB — CMP (CANCER CENTER ONLY)
ALT: 39 U/L (ref 0–44)
AST: 45 U/L — ABNORMAL HIGH (ref 15–41)
Albumin: 3.9 g/dL (ref 3.5–5.0)
Alkaline Phosphatase: 287 U/L — ABNORMAL HIGH (ref 38–126)
Anion gap: 10 (ref 5–15)
BUN: 15 mg/dL (ref 8–23)
CO2: 26 mmol/L (ref 22–32)
Calcium: 9.5 mg/dL (ref 8.9–10.3)
Chloride: 105 mmol/L (ref 98–111)
Creatinine: 1.02 mg/dL (ref 0.61–1.24)
GFR, Estimated: >60 mL/min (ref >60)
Glucose, Bld: 107 mg/dL — ABNORMAL HIGH (ref 70–99)
Potassium: 3.7 mmol/L (ref 3.5–5.1)
Sodium: 141 mmol/L (ref 135–145)
Total Bilirubin: 0.4 mg/dL (ref 0.3–1.2)
Total Protein: 7.8 g/dL (ref 6.5–8.1)

## 2022-03-03 LAB — CBC WITH DIFFERENTIAL (CANCER CENTER ONLY)
Abs Immature Granulocytes: 0.01 10*3/uL (ref 0.00–0.07)
Basophils Absolute: 0.1 10*3/uL (ref 0.0–0.1)
Basophils Relative: 1 %
Eosinophils Absolute: 0.5 10*3/uL (ref 0.0–0.5)
Eosinophils Relative: 8 %
HCT: 36.8 % — ABNORMAL LOW (ref 39.0–52.0)
Hemoglobin: 11.3 g/dL — ABNORMAL LOW (ref 13.0–17.0)
Immature Granulocytes: 0 %
Lymphocytes Relative: 21 %
Lymphs Abs: 1.4 10*3/uL (ref 0.7–4.0)
MCH: 25.6 pg — ABNORMAL LOW (ref 26.0–34.0)
MCHC: 30.7 g/dL (ref 30.0–36.0)
MCV: 83.3 fL (ref 80.0–100.0)
Monocytes Absolute: 0.4 10*3/uL (ref 0.1–1.0)
Monocytes Relative: 5 %
Neutro Abs: 4.4 10*3/uL (ref 1.7–7.7)
Neutrophils Relative %: 65 %
Platelet Count: 212 10*3/uL (ref 150–400)
RBC: 4.42 MIL/uL (ref 4.22–5.81)
RDW: 17.7 % — ABNORMAL HIGH (ref 11.5–15.5)
WBC Count: 6.8 10*3/uL (ref 4.0–10.5)
nRBC: 0 % (ref 0.0–0.2)

## 2022-03-03 LAB — CEA (ACCESS): CEA (CHCC): 644.67 ng/mL — ABNORMAL HIGH (ref 0.00–5.00)

## 2022-03-03 MED ORDER — ONDANSETRON HCL 8 MG PO TABS: 8.0000 mg | ORAL_TABLET | Freq: Three times a day (TID) | ORAL | 0 refills | Status: DC | PRN

## 2022-03-03 MED ORDER — LIDOCAINE-PRILOCAINE 2.5-2.5 % EX CREA: 1.0000 | TOPICAL_CREAM | CUTANEOUS | 0 refills | Status: DC | PRN

## 2022-03-03 MED ORDER — PROCHLORPERAZINE MALEATE 10 MG PO TABS: 10.0000 mg | ORAL_TABLET | Freq: Four times a day (QID) | ORAL | 0 refills | Status: DC | PRN

## 2022-03-04 ENCOUNTER — Encounter: Payer: Self-pay | Admitting: Internal Medicine

## 2022-03-04 NOTE — Assessment & Plan Note (Signed)
Patient has GI malignancy that appears to be stage 4. Exact origin is not known but likely metastatic esophageal cancer. Patient not able to undergo EGD for 3 months after MI. He is seeing oncology. Liver biopsy tissue submitted for Foundation 1, mismatch repair protein, and HER2 testing. Per tentative plan he is most likely to undergo FolFox therapy. He will see oncology on 03/14/2022 with a plan to begin FOLFOX on 03/15/2022. Port cath to be placed 03/03/22. Patient states oncologist appears optimistic at the current moment. I advised patient to follow with oncology and [place]. I counseled him on the extent of his cancer but did not provide any prognostic data as that can be done best by oncology. His pain clinic is no longer able to see him, so we will take over his pain regimen as that will be convenient for the patient. Pain contract has been signed. He will follow with [place] next month.  -Encouraged close follow up with oncology and [facility] -Will take over pain contract given recent diagnosis of cancer and prolonged hx of pain medications.

## 2022-03-04 NOTE — Assessment & Plan Note (Addendum)
Patient has rib fractures 2/2 to cardiac arrest and has been having chest wall pain. The pain was managed with oxycodone but that ran out and patient went to the ED for this. He was given pain medications that resulted in transient AMS but that resolved quickly. Patient had trouble receiving pain medication from his clinic as he was provided a script on discharge but this was resolved. Patient states his doctor at the pain clinic is retiring and he needs a referral to another pain management. Given recent diagnosis of cancer, and RA will take over the pain contract.

## 2022-03-04 NOTE — Assessment & Plan Note (Signed)
Flu shot given this visit. 

## 2022-03-04 NOTE — Assessment & Plan Note (Addendum)
Patient has hx of DVT and PE which appears 2/2 to hypercoagulable state given underlying malignancy. On Eliquis 5 mg BID.  -Advised to continue eliquis 5 mg BID

## 2022-03-06 ENCOUNTER — Other Ambulatory Visit: Payer: Self-pay | Admitting: Internal Medicine

## 2022-03-06 NOTE — Progress Notes (Signed)
Internal Medicine Clinic Attending  Case discussed with Dr. Khan  At the time of the visit.  We reviewed the resident's history and exam and pertinent patient test results.  I agree with the assessment, diagnosis, and plan of care documented in the resident's note.  

## 2022-03-07 ENCOUNTER — Ambulatory Visit (HOSPITAL_COMMUNITY)
Admission: RE | Admit: 2022-03-07 | Discharge: 2022-03-07 | Disposition: A | Discharge status: Home / Self Care | Payer: Medicare Other | Source: Ambulatory Visit | Attending: Oncology | Admitting: Oncology

## 2022-03-07 ENCOUNTER — Other Ambulatory Visit: Payer: Self-pay | Admitting: Oncology

## 2022-03-07 ENCOUNTER — Other Ambulatory Visit: Payer: Self-pay

## 2022-03-07 DIAGNOSIS — C787 Secondary malignant neoplasm of liver and intrahepatic bile duct: Secondary | ICD-10-CM | POA: Insufficient documentation

## 2022-03-07 DIAGNOSIS — C801 Malignant (primary) neoplasm, unspecified: Secondary | ICD-10-CM

## 2022-03-07 DIAGNOSIS — Z452 Encounter for adjustment and management of vascular access device: Secondary | ICD-10-CM | POA: Diagnosis not present

## 2022-03-07 DIAGNOSIS — C269 Malignant neoplasm of ill-defined sites within the digestive system: Secondary | ICD-10-CM | POA: Diagnosis not present

## 2022-03-07 HISTORY — PX: IR IMAGING GUIDED PORT INSERTION: IMG5740

## 2022-03-07 MED ORDER — MIDAZOLAM HCL 2 MG/2ML IJ SOLN
INTRAMUSCULAR | Status: AC
  Filled 2022-03-07: qty 4

## 2022-03-07 MED ORDER — MIDAZOLAM HCL 2 MG/2ML IJ SOLN
INTRAMUSCULAR | Status: AC | PRN
  Administered 2022-03-07: 1 mg via INTRAVENOUS

## 2022-03-07 MED ORDER — FENTANYL CITRATE (PF) 100 MCG/2ML IJ SOLN
INTRAMUSCULAR | Status: AC
  Filled 2022-03-07: qty 4

## 2022-03-07 MED ORDER — LIDOCAINE-EPINEPHRINE 2 %-1:100000 IJ SOLN
INTRAMUSCULAR | Status: AC | PRN
  Administered 2022-03-07: 15 mL

## 2022-03-07 MED ORDER — FENTANYL CITRATE (PF) 100 MCG/2ML IJ SOLN
INTRAMUSCULAR | Status: AC | PRN
  Administered 2022-03-07: 50 ug via INTRAVENOUS

## 2022-03-07 MED ORDER — LIDOCAINE-EPINEPHRINE 1 %-1:100000 IJ SOLN
INTRAMUSCULAR | Status: AC
  Filled 2022-03-07: qty 1

## 2022-03-07 MED ORDER — HEPARIN SOD (PORK) LOCK FLUSH 100 UNIT/ML IV SOLN
INTRAVENOUS | Status: AC
  Filled 2022-03-07: qty 5

## 2022-03-07 MED ORDER — SODIUM CHLORIDE 0.9 % IV SOLN: INTRAVENOUS | Status: DC

## 2022-03-07 NOTE — Procedures (Signed)
Interventional Radiology Procedure Note  Procedure: Placement of a right IJ approach single lumen PowerPort.  Tip is positioned at the superior cavoatrial junction and catheter is ready for immediate use.  Complications: No immediate Recommendations:  - Ok to shower tomorrow - Do not submerge for 7 days - Routine line care   Signed,  Paxton Binns K. Rizwan Kuyper, MD   

## 2022-03-07 NOTE — H&P (Signed)
Chief Complaint: Patient was seen in consultation today for [facility] a cath placement at the request of Sherrill,Gary B  Referring Physician(s): Ladell Pier  Supervising Physician: Jacqulynn Cadet  Patient Status: [facility] - Out-pt  History of Present Illness: [name] [name] is a 61 y.o. male   GI cancer No known primary Liver bx was performed 02/15/22  The clinical presentation and imaging are most suggestive of an upper gastrointestinal primary such as gastroesophageal cancer, but the differential diagnosis includes pancreas cancer, other gastrointestinal malignancies, and lung cancer.  02/15/22: A. LIVER, NEEDLE CORE BIOPSY:  - Adenocarcinoma with extensive necrosis, see comment  COMMENT:  Immunohistochemical stains show that the tumor cells are positive for  CK7 while they are negative for CK20, CDX2, TTF-1 and PAX8.  This  immunoprofile is nonspecific but compatible with the clinical suspicion  of a pancreatic primary.  Differential diagnosis can include an upper  gastrointestinal, pancreatobiliary and lung primary.  Clinical and  radiologic correlation is suggested.  Dr. Learta Codding was paged on  02/17/2022.   Has appt with Dr Benay Spice next week To start chemotherapy soon Scheduled for [facility] a cath today  Past Medical History:  Diagnosis Date   Abnormal CT scan, gastrointestinal tract    Acute respiratory failure ([place]) 01/31/2022   Acute respiratory failure with hypoxemia (HCC) 02/11/2022   Anemia    Anemia 08/06/2012   Aortic valve mass 12/30/2015   Bilateral shoulder pain 08/20/2012   Bite wound of left hand 09/27/2021   Cardiogenic shock ([place]) 01/31/2022   Community acquired pneumonia 11/07/2011   Critical limb ischemia with history of revascularization of same extremity ([place]) 04/28/2020   DDD (degenerative disc disease), lumbosacral     and grade 2 slip   DVT (deep venous thrombosis) ([place]) 01/31/2022   GERD (gastroesophageal reflux disease)    H/O: stroke 01/31/2022   Heart  murmur    History of anemia    no current med.   History of COVID-19 05/10/2019   History of critical lower limb ischemia 02/19/2019   Hyperglycemia 01/31/2022   Hypertension    states is borderline on medication; has been on med. x 5-6 yr.   Hypokalemia 05/30/2018   Impingement syndrome of shoulder region 10/2014   left   Insomnia 06/01/2015   Ischemic ulcer of toe of left foot (HCC)    Lactic acidosis 01/31/2022   Left shoulder pain 12/16/2015   Liver mass    Low back pain without sciatica 10/29/2009   Lupus ([place])    Multiple fractures of ribs, bilateral, due to CPR trauma 01/31/2022   Onychomycosis 01/28/2021   Paronychia of great toe 01/28/2021   Peripheral vascular disease ([place])    Pneumonia    Pressure injury of skin 01/31/2022   Pulmonary contusion 01/31/2022   RA (rheumatoid arthritis) ([place])    Rotator cuff tear 11/04/2014   STEMI (ST elevation myocardial infarction) ([place]) 01/31/2022   Sternal fracture 01/31/2022   Transaminitis 12/16/2015    Past Surgical History:  Procedure Laterality Date   ABDOMINAL AORTAGRAM  02/20/2019   ABDOMINAL AORTOGRAM W/LOWER EXTREMITY N/A 02/20/2019   Procedure: ABDOMINAL AORTOGRAM W/LOWER EXTREMITY;  Surgeon: Marty Heck, MD;  Location: [place] [facility];  Service: Cardiovascular;  Laterality: N/A;   ACHILLES TENDON SURGERY Bilateral    AMPUTATION Left 05/11/2020   Procedure: LEFT ABOVE KNEE AMPUTATION;  Surgeon: Angelia Mould, MD;  Location: [place];  Service: Vascular;  Laterality: Left;   ENDARTERECTOMY POPLITEAL Left 05/01/2020   Procedure:  ENDARTERECTOMY POPLITEAL;  Surgeon: Waynetta Sandy, MD;  Location: [place];  Service: Vascular;  Laterality: Left;   FASCIOTOMY Left 05/01/2020   Procedure: FASCIOTOMY;  Surgeon: Waynetta Sandy, MD;  Location: [place];  Service: Vascular;  Laterality: Left;   FEMORAL-POPLITEAL BYPASS GRAFT Left 02/26/2019   Procedure: BYPASS GRAFT FEMORAL-POPLITEAL ARTERY LEFT LEG USING 24m PROPATEN GRAFT;   Surgeon: BSerafina Mitchell MD;  [place]  Service: Vascular;  Laterality: Left;   INTRAOPERATIVE ARTERIOGRAM Left 01/22/2020   Procedure: INTRA OPERATIVE ARTERIOGRAM with administration of thrombolyics in Left femoral to popliteal bypass.;  Surgeon: CMarty Heck MD;  [place]  Service: Vascular;  Laterality: Left;   LEFT HEART CATH AND CORONARY ANGIOGRAPHY N/A 03/15/2017   Procedure: LEFT HEART CATH AND CORONARY ANGIOGRAPHY;  Surgeon: ENelva Bush MD;  [place];  Service: Cardiovascular;  Laterality: N/A;   LEFT HEART CATH AND CORONARY ANGIOGRAPHY N/A 02/06/2022   Procedure: LEFT HEART CATH AND CORONARY ANGIOGRAPHY;  Surgeon: JMartinique Peter M, MD;  [place];  Service: Cardiovascular;  Laterality: N/A;   LOWER EXTREMITY INTERVENTION Left 04/29/2020   Procedure: LOWER EXTREMITY INTERVENTION- LYSIS;  Surgeon: CMarty Heck MD;  [place] [facility] [facility];  Service: Cardiovascular;  Laterality: Left;   OSTEOTOMY AND ULNAR SHORTENING Right 07/22/2002   PATCH ANGIOPLASTY Left 05/01/2020   Procedure: PATCH ANGIOPLASTY USING HEMASHIELD PLATINUM FINESSE PATCH;  Surgeon: CWaynetta Sandy MD;  [place]  Service: Vascular;  Laterality: Left;   PERIPHERAL VASCULAR ATHERECTOMY  01/23/2020   Procedure: PERIPHERAL VASCULAR ATHERECTOMY;  Surgeon: CMarty Heck MD;  [place] [facility];  Service: Cardiovascular;;   PERIPHERAL VASCULAR BALLOON ANGIOPLASTY  01/23/2020   Procedure: PERIPHERAL VASCULAR BALLOON ANGIOPLASTY;  Surgeon: CMarty Heck MD;  [place] [facility];  Service: Cardiovascular;;   PERIPHERAL VASCULAR INTERVENTION Left 04/30/2020   Procedure: PERIPHERAL VASCULAR INTERVENTION;  Surgeon: HCherre Robins MD;  [place] [facility];  Service: Cardiovascular;  Laterality: Left;   PERIPHERAL VASCULAR THROMBECTOMY N/A 01/23/2020   Procedure: lysis recheck;  Surgeon: CMarty Heck MD;  [place] [facility];  Service: Cardiovascular;  Laterality: N/A;  + [place]    PERIPHERAL VASCULAR THROMBECTOMY N/A 04/30/2020   Procedure: Lysis Recheck;  Surgeon: HCherre Robins MD;  [place] [facility];  Service: Cardiovascular;  Laterality: N/A;   SHOULDER ARTHROSCOPY WITH BICEPSTENOTOMY Right 03/11/2015   Procedure: SHOULDER ARTHROSCOPY WITH BICEPSTENOTOMY;  Surgeon: DNinetta Lights MD;  [place]  Service: Orthopedics;  Laterality: Right;   SHOULDER ARTHROSCOPY WITH DISTAL CLAVICLE RESECTION Left 11/19/2014   Procedure: SHOULDER ARTHROSCOPY WITH DISTAL CLAVICLE RESECTION;  Surgeon: DKathryne Hitch MD;  [place]  Service: Orthopedics;  Laterality: Left;   SHOULDER ARTHROSCOPY WITH ROTATOR CUFF REPAIR AND SUBACROMIAL DECOMPRESSION Left 11/19/2014   Procedure: LEFT SHOULDER ARTHROSCOPY, DEBRIDEMENT DISTAL CLAVICLE EXCISION, ACROMIOPLASTY WITH ROTATOR CUFF REPAIR ;  Surgeon: DKathryne Hitch MD;  [place]  Service: Orthopedics;  Laterality: Left;   THROMBECTOMY OF BYPASS GRAFT FEMORAL- POPLITEAL ARTERY Left 05/01/2020   Procedure: THROMBECTOMY OF LOWER EXTREMITY;  Surgeon: CWaynetta Sandy MD;  [place]  Service: Vascular;  Laterality: Left;   TRANSFORAMINAL LUMBAR INTERBODY FUSION (TLIF) WITH PEDICLE SCREW FIXATION 1 LEVEL N/A 01/03/2018   Procedure: TRANSFORAMINAL LUMBAR INTERBODY FUSION (TLIF) LUMBAR FIVE-SACRAL ONE;  Surgeon: BMelina Schools MD;  [facility];  Service: Orthopedics;  Laterality: N/A;   ULTRASOUND GUIDANCE FOR VASCULAR ACCESS Right 01/22/2020   Procedure: ULTRASOUND GUIDANCE FOR VASCULAR ACCESS Right Common Femoral Artery.;  Surgeon: Marty Heck, MD;  Location: [place];  Service: Vascular;  Laterality: Right;   VIDEO ASSISTED THORACOSCOPY (VATS)/DECORTICATION Right 11/21/2011   drainage of empyema    Allergies: Dilaudid  [hydromorphone hcl], Ramipril, and Tramadol  Medications: Prior to Admission medications   Medication Sig Start Date End Date Taking? Authorizing Provider  apixaban (ELIQUIS) 5 MG TABS tablet Take 1 tablet (5 mg total) by mouth 2 (two) times daily. 02/28/22  Yes Mapp, Tavien, MD  atorvastatin (LIPITOR) 80 MG tablet Take 1 tablet (80 mg total) by mouth daily at 6 PM. 02/20/22  Yes Serita Butcher, MD  clopidogrel (PLAVIX) 75 MG tablet Take 1 tablet (75 mg total) by mouth daily. 07/18/21 07/18/22 Yes Delene Ruffini, MD  diclofenac Sodium (VOLTAREN) 1 % GEL Apply 2-4 g topically in the morning, at noon, in the evening, and at bedtime. 12/26/21  Yes [provider]  docusate sodium (COLACE) 100 MG capsule Take 1 capsule (100 mg total) by mouth daily. 01/27/21  Yes Lyndal Pulley, MD  DULoxetine (CYMBALTA) 30 MG capsule Take 30 mg by mouth daily. 12/26/21  Yes [provider]  gabapentin (NEURONTIN) 100 MG capsule Take 100 mg by mouth at bedtime as needed (for pain).  12/31/19  Yes [provider]  ondansetron (ZOFRAN) 8 MG tablet Take 1 tablet (8 mg total) by mouth every 8 (eight) hours as needed for nausea or vomiting. 03/03/22  Yes Owens Shark, NP  oxyCODONE-acetaminophen (PERCOCET) 10-325 MG tablet Take 1 tablet by mouth every 6 (six) hours as needed for pain. 03/02/22 04/01/22 Yes Idamae Schuller, MD  prochlorperazine (COMPAZINE) 10 MG tablet Take 1 tablet (10 mg total) by mouth every 6 (six) hours as needed for nausea or vomiting. 03/03/22  Yes Owens Shark, NP  lidocaine (LIDODERM) 5 % Place 1 patch onto the skin daily. Remove & Discard patch within 12 hours or as directed by MD For chest wall pain Patient not taking: Reported on 02/22/2022 02/09/22   Florencia Reasons, MD  lidocaine-prilocaine (EMLA) cream Apply 1 Application topically as needed. 03/03/22   Owens Shark, NP  metoprolol tartrate (LOPRESSOR) 25 MG tablet Take 0.5 tablets (12.5 mg total) by mouth 2 (two) times daily. Patient not  taking: Reported on 02/27/2022 02/07/22   Charlynne Cousins, MD  naloxone [facility]) nasal spray 4 mg/0.1 mL Place 1 spray into the nose as needed. Patient not taking: Reported on 02/22/2022 12/26/21   [provider]     Family History  Problem Relation Age of Onset   Heart attack Father 56   Alcohol abuse Father    Aneurysm Mother    Stroke Neg Hx    Cancer Neg Hx     Social History   Socioeconomic History   Marital status: Single    Spouse name: Not on file   Number of children: 0   Years of education: Not on file   Highest education level: Not on file  Occupational History   Occupation: Disabled    Comment: 2/2 RA  Tobacco Use   Smoking status: Former    Packs/day: 0.25    Years: 20.00    Total pack years: 5.00    Types: Cigarettes    Quit date: 02/19/2019    Years since quitting: 3.0   Smokeless tobacco: Never  Tobacco comments:    Patient stated he smokes a couple day and is attempting to quit smoking  Vaping Use   Vaping Use: Never used  Substance and Sexual Activity   Alcohol use: No    Alcohol/week: 0.0 standard drinks of alcohol    Comment: sober 1998   Drug use: No   Sexual activity: Not on file  Other Topics Concern   Not on file  Social History Narrative   On disability since 1992, used to work with city of [place]. Quit drinking in 1990.      Current Social History 11/05/2019        Patient lives by himself most of the time. Sometimes girlfriend's son stays with him in a one level home. There are 3 steps with handrail up to the entrance the patient uses.       Patient's method of transportation is personal truck. This was recently vandalized and patient doesn't have funds to repair at this time.      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[place], BSN, RN-BC    Social Determinants of Health   Financial Resource Strain: Not on file  Food Insecurity: No Food Insecurity (01/27/2022)   Hunger Vital Sign    Worried About Running Out of Food in the Last Year: Never true    [place] in the Last Year: Never true  Transportation Needs: No Transportation Needs (03/01/2022)   PRAPARE - Hydrologist (Medical): No    Lack of Transportation (Non-Medical): No  Physical Activity: Not on file  Stress: Not on file  Social Connections: Not on file      Review of Systems: A 12 point ROS discussed and pertinent positives are indicated in the HPI above.  All other systems are negative.  Review of Systems  Constitutional:  Positive for fatigue and unexpected weight change. Negative for activity change and fever.  Respiratory:  Negative for cough and shortness of breath.   Cardiovascular:  Negative for chest pain.  Gastrointestinal:  Negative for abdominal pain.  Neurological:  Negative for weakness.  Psychiatric/Behavioral:  Negative for behavioral problems and confusion.     Vital Signs: BP 109/81   Pulse 96   Temp 98.7 F (37.1 C) (Tympanic)   Resp 18   Ht '5\' 7"'$  (1.702 m)   Wt 155 lb (70.3 kg)   SpO2 94%   BMI 24.28 kg/m     Physical Exam Vitals reviewed.  HENT:     Mouth/Throat:     Mouth: Mucous membranes are moist.  Cardiovascular:     Rate and Rhythm: Normal rate and regular rhythm.     Heart sounds: Normal heart sounds.  Pulmonary:     Effort: Pulmonary effort is normal.     Breath sounds: Normal breath sounds.  Abdominal:     Palpations: Abdomen is soft.     Tenderness:  There is no abdominal tenderness.  Musculoskeletal:        General: Normal range of  motion.  Skin:    General: Skin is warm.  Neurological:     Mental Status: He is alert and oriented to person, place, and time.  Psychiatric:        Behavior: Behavior normal.     Imaging: EEG adult  Result Date: 02/28/2022 Lora Havens, MD     02/28/2022  9:23 AM Patient Name: [name] [name] MRN: [id number] Epilepsy Attending: Lora Havens Referring Physician/Provider: Regan Lemming, MD Date: 02/28/2022 Duration: 26.07 mins Patient history: 61yo M with ams. EEG to evaluate for seizure Level of alertness: Asleep AEDs during EEG study: None Technical aspects: This EEG study was done with scalp electrodes positioned according to the 10-20 International system of electrode placement. Electrical activity was reviewed with band pass filter of 1-'70Hz'$ , sensitivity of 7 uV/mm, display speed of 67m/sec with a '60Hz'$  notched filter applied as appropriate. EEG data were recorded continuously and digitally stored.  Video monitoring was available and reviewed as appropriate. Description: Sleep was characterized by vertex waves, sleep spindles (12 to 14 Hz), maximal frontocentral region. Physiologic photic driving was not seen during photic stimulation. Hyperventilation was not performed.   IMPRESSION: This study during sleep is within normal limits. No seizures or epileptiform discharges were seen throughout the recording. A normal interictal EEG does not exclude nor support the diagnosis of epilepsy. If suspicion for interictal activity remains a concern, a prolonged study including sleep can be considered. PLora Havens  MR BRAIN WO CONTRAST  Result Date: 02/28/2022 CLINICAL DATA:  Initial evaluation for mental status change, unknown cause. EXAM: MRI HEAD WITHOUT CONTRAST TECHNIQUE: Multiplanar, multiecho pulse sequences of the brain and surrounding structures were obtained without intravenous contrast. COMPARISON:  Prior CT from 01/31/2022. FINDINGS: Brain: Cerebral volume within normal limits for age.  Scattered patchy T2/FLAIR hyperintensity involving the periventricular, deep, and subcortical white matter of both cerebral hemispheres. Patchy involvement of the pons. Findings most likely related chronic microvascular ischemic disease, and are moderately advanced. Few small remote lacunar infarcts present about the right basal ganglia, left thalamus, and pons. Multiple remote right greater than left cerebellar infarcts noted. Encephalomalacia and gliosis involving the left occipital lobe consistent with a chronic left PCA distribution infarct. Associated mild chronic hemosiderin staining at this location. Multiple subtle subcentimeter foci of mild diffusion abnormality are seen involving a deep and subcortical white matter of both cerebral hemispheres (series 5, images 98-89), suspicious for small evolving acute to early subacute ischemic infarcts. Largest of these measures 7 mm at the left parietal region (series 5, image 89). No associated hemorrhage or mass effect. Otherwise, gray-white matter differentiation maintained. No other areas of chronic cortical infarction. No other acute or chronic intracranial blood products. No mass lesion, midline shift or mass effect. No hydrocephalus or extra-axial fluid collection. Pituitary gland suprasellar region within normal limits. Vascular: Major intracranial vascular flow voids are maintained. Skull and upper cervical spine: Craniocervical junction within normal limits. Bone marrow signal intensity normal. No scalp soft tissue abnormality. Sinuses/Orbits: Globes orbital soft tissues within normal limits. Few small retention cysts noted about the maxillary sinuses. Paranasal sinuses are otherwise largely clear. Small right mastoid effusion noted, of doubtful significance. Other: None. IMPRESSION: 1. Multiple subcentimeter foci of diffusion abnormality involving supratentorial cerebral white matter as above, suspicious for small evolving acute to early subacute ischemic  infarcts. No associated hemorrhage or mass effect. 2. Multiple  remote right greater than left cerebellar infarcts, remote left PCA territory infarct, with additional chronic lacunar infarcts involving the right basal ganglia, left thalamus, and pons. 3. Underlying moderately advanced chronic microvascular ischemic disease. Electronically Signed   By: Jeannine Boga M.D.   On: 02/28/2022 03:13   CT Angio Chest PE W and/or Wo Contrast  Result Date: 02/27/2022 CLINICAL DATA:  History of pulmonary embolism, chest pain, shortness of breath EXAM: CT ANGIOGRAPHY CHEST WITH CONTRAST TECHNIQUE: Multidetector CT imaging of the chest was performed using the standard protocol during bolus administration of intravenous contrast. Multiplanar CT image reconstructions and MIPs were obtained to evaluate the vascular anatomy. RADIATION DOSE REDUCTION: This exam was performed according to the departmental dose-optimization program which includes automated exposure control, adjustment of the mA and/or kV according to patient size and/or use of iterative reconstruction technique. CONTRAST:  60m OMNIPAQUE IOHEXOL 350 MG/ML SOLN COMPARISON:  02/11/2022 FINDINGS: Cardiovascular: Coronary artery calcifications are seen. Heart is enlarged in size. RV LV ratio is 1.6. Coronary artery calcifications are seen. There is homogeneous enhancement in thoracic aorta. There are segmental and subsegmental filling defects in left lower lobe, right upper lobe and right lower lobe. There is interval appearance of small filling defect in segmental branching right lower lobe. There is small thrombus burden. Mediastinum/Nodes: There are enlarged lymph nodes anterior to the right side of heart measuring up to 12 mm in short axis. In the previous study, the no measure 11 mm in short axis. There are a few other subcentimeter nodes in mediastinum with no significant change. Lungs/Pleura: There is interval decrease in bilateral pleural effusions. There  is small residual left pleural effusion. There is interval improvement in aeration of lower lung fields suggesting resolving atelectasis/pneumonia. There is no pneumothorax. Upper Abdomen: There is large ill-defined mass lesion in the liver measuring up to 8.5 cm in maximum diameter. This finding has not changed significantly. Findings suggest possible primary or metastatic malignancy in liver. There is mild nodularity and liver surface suggesting possible cirrhosis. Air is seen in intrahepatic bile ducts, possibly suggesting sphincterotomy. Gallbladder fossa is not included in the images. There are possible enlarged lymph nodes along the lesser curvature aspect of the stomach measuring up to 2.2 cm in short axis with interval increase in size. Musculoskeletal: No acute findings are seen. Review of the MIP images confirms the above findings. IMPRESSION: There is pulmonary embolism in segmental and subsegmental branches in left lower lobe and right upper lobe with no significant change. There is interval appearance of intraluminal filling defect in a segmental branch in the right lower lobe. Thrombus burden is small. Right ventricular cavity is more prominent in size in comparison to the left ventricular cavity suggesting right ventricular strain which may be chronic. There is a 8.5 cm ill-defined low-density space-occupying lesion in liver suggesting possible malignant neoplasm. Air in the intrahepatic bile ducts may suggest sphincterotomy. There is interval increase in size of pericardial lymph nodes and lymph nodes along the lesser curvature aspect of the stomach suggesting progression of metastatic lymphadenopathy. Follow-up PET-CT and tissue sampling as clinically warranted should be considered. Other findings as described in the body of the report. Electronically Signed   By: PElmer PickerM.D.   On: 02/27/2022 19:26   DG Chest 2 View  Result Date: 02/27/2022 CLINICAL DATA:  Chest pain and shortness of  breath. EXAM: CHEST - 2 VIEW COMPARISON:  02/11/2022 FINDINGS: The cardiopericardial silhouette is within normal limits for size. The lungs are clear  without focal pneumonia, edema, pneumothorax or pleural effusion. The visualized bony structures of the thorax are unremarkable. IMPRESSION: No active cardiopulmonary disease. Left pleural effusion seen previously has resolved in the interval. Electronically Signed   By: Misty Stanley M.D.   On: 02/27/2022 13:46   US BIOPSY (LIVER)  Result Date: 02/15/2022 INDICATION: No known primary, now with large hepatic mass as well as upper abdominal lymphadenopathy worrisome for metastatic disease. Please perform ultrasound-guided liver mass biopsy for tissue diagnostic purposes. EXAM: ULTRASOUND GUIDED LIVER LESION BIOPSY COMPARISON:  Abdominal MRI-02/11/2022; CT abdomen and pelvis-02/11/2022 MEDICATIONS: None ANESTHESIA/SEDATION: Moderate (conscious) sedation was employed during this procedure as administered by the Interventional Radiology RN. A total of Versed 1 mg and Fentanyl 25 mcg was administered intravenously. Moderate Sedation Time: 14 minutes. The patient's level of consciousness and vital signs were monitored continuously by radiology nursing throughout the procedure under my direct supervision. COMPLICATIONS: None immediate. PROCEDURE: Informed written consent was obtained from the patient after a discussion of the risks, benefits and alternatives to treatment. The patient understands and consents the procedure. A timeout was performed prior to the initiation of the procedure. Ultrasound scanning was performed of the right upper abdominal quadrant demonstrates an at least 7.9 x 7.6 cm hypoechoic mass involving the majority of the medial segment of the left lobe of the liver (image 5), compatible with the dominant mass seen on preceding abdominal MRI. The procedure was planned. The right upper abdominal quadrant was prepped and draped in the usual sterile  fashion. The overlying soft tissues were anesthetized with 1% lidocaine with epinephrine. A 17 gauge, 6.8 cm co-axial needle was advanced into a peripheral aspect of the lesion. This was followed by 6 core biopsies with an 18 gauge core device under direct ultrasound guidance. The coaxial needle tract was embolized with a small amount of Gel-Foam slurry and superficial hemostasis was obtained with manual compression. Post procedural scanning was negative for definitive area of hemorrhage or additional complication. A dressing was placed. The patient tolerated the procedure well without immediate post procedural complication. IMPRESSION: Technically successful ultrasound guided core needle biopsy of large mass occupying the majority of the medial segment of the left lobe of the liver. Electronically Signed   By: Sandi Mariscal M.D.   On: 02/15/2022 16:27   US THORACENTESIS ASP PLEURAL SPACE W/IMG GUIDE  Result Date: 02/12/2022 INDICATION: Left pleural effusion EXAM: ULTRASOUND GUIDED Left THORACENTESIS MEDICATIONS: 10 cc 1% lidocaine. COMPLICATIONS: None immediate. PROCEDURE: An ultrasound guided thoracentesis was thoroughly discussed with the patient and questions answered. The benefits, risks, alternatives and complications were also discussed. The patient understands and wishes to proceed with the procedure. Written consent was obtained. Ultrasound was performed to localize and mark an adequate pocket of fluid in the left chest. The area was then prepped and draped in the normal sterile fashion. 1% Lidocaine was used for local anesthesia. Under ultrasound guidance a Yueh catheter was introduced. Thoracentesis was performed. The catheter was removed and a dressing applied. FINDINGS: A total of approximately 300 cc of blood tinged fluid was removed. Samples were sent to the laboratory as requested by the clinical team. IMPRESSION: Successful ultrasound guided left thoracentesis yielding 300 cc of pleural fluid.  CXR: No PTX Read by Lavonia Drafts [facility] Electronically Signed   By: Corrie Mckusick D.O.   On: 02/12/2022 08:35   MR ABDOMEN W WO CONTRAST  Result Date: 02/11/2022 CLINICAL DATA:  Liver, pancreatic, and esophageal masses identified by CT EXAM: MRI ABDOMEN  WITHOUT AND WITH CONTRAST TECHNIQUE: Multiplanar multisequence MR imaging of the abdomen was performed both before and after the administration of intravenous contrast. CONTRAST:  68m GADAVIST GADOBUTROL 1 MMOL/ML IV SOLN COMPARISON:  Same-day CT abdomen pelvis FINDINGS: Lower chest: Cardiomegaly. Small bilateral pleural effusions and associated atelectasis or consolidation. Hepatobiliary: Hepatomegaly, maximum coronal span 24.4 cm. Probable hepatic steatosis, however due to technical error only in phase images are submitted for review. Large, rim enhancing, internally hypoenhancing liver mass centered in the anterior left lobe of the liver, hepatic segments IVA and IVB, measuring 7.8 x 7.2 cm (series 13, image 31). No gallstones, gallbladder wall thickening, or biliary dilatation. Pancreas: Diffuse inflammatory fat stranding and fluid about the pancreas and adjacent retroperitoneum. Multiple, lobulated, rim enhancing fluid signal lesions throughout the pancreatic head and neck, largest centrally measuring 3.6 x 2.8 cm (series 6, image 27). Pancreatic ductal dilatation. Spleen: Splenomegaly, maximum coronal span 14.5 cm. Adrenals/Urinary Tract: Adrenal glands are unremarkable. Kidneys are normal, without renal calculi, solid lesion, or hydronephrosis. Stomach/Bowel: Rim enhancing, internally hypoenhancing mass or lymph node conglomerate centered about the gastroesophageal junction, measuring 5.0 x 3.9 cm (series 13, image 29). No evidence of bowel wall thickening, distention, or inflammatory changes. Vascular/Lymphatic: Aortic atherosclerosis. Rim enhancing, internally hypoenhancing gastrohepatic ligament lymph node measuring up to 2.7 x 2.5 cm (series 12, image  40). Enlarged heterogeneously enhancing retroperitoneal lymph nodes measuring up to 2.3 x 1.6 cm (series 12, image 74). Other: No abdominal wall hernia. Anasarca small perihepatic and perisplenic ascites. Musculoskeletal: No acute or significant osseous findings. IMPRESSION: 1. Rim enhancing, likely necrotic mass or lymph node conglomerate centered about the gastroesophageal junction, measuring 5.0 x 3.9 cm. 2. Large, rim enhancing, likely necrotic liver mass centered in the anterior left lobe of the liver, measuring 7.8 x 7.2 cm. 3. Enlarged gastrohepatic ligament and retroperitoneal lymph nodes. 4. Constellation of findings is most consistent with primary esophageal malignancy and associated metastatic disease. 5. Diffuse inflammatory fat stranding and fluid about the pancreas and adjacent retroperitoneum, with multiple, lobulated, rim enhancing fluid signal lesions throughout the pancreatic head and neck, largest centrally measuring 3.6 x 2.8 cm. Findings are most consistent with acute pancreatitis complicated by acute pancreatic fluid collections, the character and fluid composition of these pancreatic lesions distinct from esophageal and liver masses. 6. Small bilateral pleural effusions and associated atelectasis or consolidation. 7. Anasarca. Electronically Signed   By: ADelanna AhmadiM.D.   On: 02/11/2022 19:29   DG Chest 1 View  Result Date: 02/11/2022 CLINICAL DATA:  Status post thoracentesis EXAM: CHEST  1 VIEW COMPARISON:  02/11/2022 FINDINGS: Trace left pleural effusion. No focal consolidation. No pneumothorax. No right pleural effusion. Stable cardiomegaly. No acute osseous abnormality. IMPRESSION: 1. Trace left pleural effusion. No pneumothorax. Electronically Signed   By: HKathreen DevoidM.D.   On: 02/11/2022 10:33   CT ABDOMEN PELVIS W CONTRAST  Result Date: 02/11/2022 CLINICAL DATA:  Liver mass on CT chest. Evaluate for metastatic disease. * Tracking Code: BO * EXAM: CT ABDOMEN AND PELVIS WITH  CONTRAST TECHNIQUE: Multidetector CT imaging of the abdomen and pelvis was performed using the standard protocol following bolus administration of intravenous contrast. RADIATION DOSE REDUCTION: This exam was performed according to the departmental dose-optimization program which includes automated exposure control, adjustment of the mA and/or kV according to patient size and/or use of iterative reconstruction technique. CONTRAST:  721mOMNIPAQUE IOHEXOL 300 MG/ML  SOLN COMPARISON:  Chest CTA earlier same day FINDINGS: Lower chest: Bibasilar collapse/consolidation with  small pleural effusions, left greater than right. Hepatobiliary: 8.7 x 7.9 x 7.4 cm ill-defined irregular mass is identified in the medial segment left liver. This appears to be isolated with no other suspicious mass lesion evident within the hepatic parenchyma. Gallbladder is nondistended with apparent trace amount of gas in the gallbladder lumen (see axial 35/3). No substantial intrahepatic biliary duct dilatation with extrahepatic common bile duct not well seen in the head of the pancreas but measuring approximately 6 mm diameter just proximal to the ampulla. Pancreas: There is diffuse peripancreatic edema with ill-defined parenchyma in the head of the pancreas including a 3.0 x 2.9 cm hypoenhancing ill-defined focal lesion visible on 41/3. No substantial main duct dilatation evident. Spleen: No splenomegaly. No focal mass lesion. Adrenals/Urinary Tract: No adrenal nodule or mass. Kidneys unremarkable. No evidence for hydroureter. The urinary bladder appears normal for the degree of distention. Stomach/Bowel: Stomach is unremarkable. No gastric wall thickening. No evidence of outlet obstruction. Duodenum is normally positioned as is the ligament of Treitz. No small bowel wall thickening. No small bowel dilatation. The terminal ileum is normal. The appendix is normal. Diverticuli are seen scattered along the entire length of the colon without CT  findings of diverticulitis. Vascular/Lymphatic: There is moderate atherosclerotic calcification of the abdominal aorta without aneurysm. 2.4 cm necrotic lymph node identified in the gastrohepatic ligament on image 30/3. 4.5 x 3.4 cm necrotic mass lesion is identified just cranial to the esophagogastric junction on image 22/3. 16 mm short axis portal caval lymph node is seen on 30/7/3. Calcified para-aortic nodal tissue is seen in the retroperitoneal space. Mild external iliac lymphadenopathy seen bilaterally (right on 75/3 and left on 76/3) nodular irregular peritoneal thickening noted in the lower abdomen and pelvis (see left pelvis on 73/3). Reproductive: The prostate gland and seminal vesicles are unremarkable. Other: Trace free fluid is seen in the pelvis. Small volume free fluid seen adjacent to the liver and spleen tracking into the paracolic gutter bilaterally. Musculoskeletal: Minimal presacral edema evident. No worrisome lytic or sclerotic osseous abnormality. Status post L5-S1 fusion IMPRESSION: 1. 8.7 x 7.9 x 7.4 cm ill-defined irregular mass in the medial segment left liver. Given findings below, metastatic disease is favored although primary hepatic neoplasm is not entirely excluded. 2. Diffuse peripancreatic edema with ill-defined parenchyma in the head of the pancreas including a 3.0 x 2.9 cm hypoenhancing ill-defined focal pancreatic head lesion. Imaging features are considered suspicious for primary pancreatic adenocarcinoma. MRI abdomen with and without contrast may prove helpful to further evaluate. Endoscopic ultrasound likely prove helpful. 3. 4.5 x 3.4 cm necrotic mass lesion is identified just cranial to the esophagogastric junction with additional metastatic lymphadenopathy in the abdomen and pelvis. 4. Irregular nodular peritoneal thickening in the lower abdomen and pelvis, concerning for metastatic disease. 5. Bibasilar collapse/consolidation with small pleural effusions, left greater than  right. 6. Trace amount of gas in the gallbladder lumen, presumably related to prior sphincterotomy. In the absence of prior sphincterotomy, ascending biliary infection would be a consideration. 7. Aortic Atherosclerosis (ICD10-I70.0). Electronically Signed   By: Misty Stanley M.D.   On: 02/11/2022 07:52   CT Angio Chest Pulmonary Embolism (PE) W or WO Contrast  Result Date: 02/11/2022 CLINICAL DATA:  Recent PE. Hypoxia this morning. Clinical concern for recurrent pulmonary embolus. EXAM: CT ANGIOGRAPHY CHEST WITH CONTRAST TECHNIQUE: Multidetector CT imaging of the chest was performed using the standard protocol during bolus administration of intravenous contrast. Multiplanar CT image reconstructions and MIPs were obtained to evaluate  the vascular anatomy. RADIATION DOSE REDUCTION: This exam was performed according to the departmental dose-optimization program which includes automated exposure control, adjustment of the mA and/or kV according to patient size and/or use of iterative reconstruction technique. CONTRAST:  182m OMNIPAQUE IOHEXOL 350 MG/ML SOLN COMPARISON:  01/31/2022 FINDINGS: Cardiovascular: Heart is enlarged. Mild atherosclerotic calcification is noted in the wall of the thoracic aorta. Left lower lobe segmental pulmonary embolus is stable in appearance (see image 155/series 7. The tiny segmental pulmonary embolus of the right upper lobe is also not appreciably changed in the interval (142/7). No new filling defect within the opacified pulmonary arteries to suggest the presence of additional acute pulmonary embolus on today's study. Mediastinum/Nodes: No mediastinal lymphadenopathy. There is no hilar lymphadenopathy. The esophagus has normal imaging features. There is no axillary lymphadenopathy. Lungs/Pleura: Patchy ground-glass opacities with superimposed nodular component seen previously in both upper lobes have resolved in the interval. There is a persistent 5 mm cavitary nodule in the anterior  left upper lobe (76/6) bibasilar collapse/consolidation noted left greater than right. Small to moderate left and small right pleural effusions are progressive in the interval. Upper Abdomen: The liver shows diffusely decreased attenuation suggesting fat deposition. Subtle nodularity of liver contour raises the question of cirrhosis. 7.8 x 8.5 cm subtle hypoattenuating mass lesion is identified in segment IV, similar to prior. Small volume free fluid is seen adjacent to the liver and spleen. Musculoskeletal: No worrisome lytic or sclerotic osseous abnormality. Multiple bilateral anterior rib fractures again noted in this patient with a reported history of recent CPR. Review of the MIP images confirms the above findings. IMPRESSION: 1. Similar appearance nonocclusive segmental and subsegmental pulmonary embolus to the left lower and right upper lobes. No new acute pulmonary embolus. 2. Interval resolution of the bilateral ground-glass opacities with superimposed nodular component seen previously in both upper lobes. 3. Persistent 5 mm cavitary nodule in the anterior left upper lobe. Follow-up CT chest in 3 months recommended to ensure stability. 4. Bibasilar collapse/consolidation left greater than right. 5. Small to moderate left and small right pleural effusions are progressive in the interval. 6. 7.8 x 8.5 cm subtle hypoattenuating mass lesion in segment IV of the liver, similar to prior but new since 06/26/2020. This could be metastatic disease or a primary liver mass. Consider dedicated abdomen/pelvis CT with oral and intravenous contrast to further evaluate. 7. Multiple bilateral anterior rib fractures again noted in this patient with a reported history of recent CPR. 8. Small volume free fluid adjacent to the liver and spleen. 9. Hepatic steatosis. Subtle nodularity of liver contour raises the question of cirrhosis. 10. Aortic Atherosclerosis (ICD10-I70.0). Critical Value/emergent results were called by  telephone at the time of interpretation on 02/11/2022 at 5:34 am to [facility], who verbally acknowledged these results. Electronically Signed   By: EMisty StanleyM.D.   On: 02/11/2022 05:35   DG Chest Port 1 View  Result Date: 02/11/2022 CLINICAL DATA:  Shortness of breath. EXAM: PORTABLE CHEST 1 VIEW COMPARISON:  February 01, 2022 FINDINGS: The endotracheal tube, nasogastric tube and left-sided venous catheter seen on the prior study have been removed. The cardiac silhouette is enlarged and predominant stable, given differences in technique. There is marked severity calcification of the aortic arch. Marked severity atelectasis and/or infiltrate is seen within the retrocardiac region of the left lung base. A small left pleural effusion is also noted. No pneumothorax is identified. The visualized skeletal structures are unremarkable. IMPRESSION: 1. Marked severity left  basilar atelectasis and/or infiltrate. 2. Small left pleural effusion. Electronically Signed   By: Virgina Norfolk M.D.   On: 02/11/2022 02:39   CARDIAC CATHETERIZATION  Result Date: 02/06/2022   Dist Cx lesion is 30% stenosed.   Prox RCA lesion is 100% stenosed.   LV end diastolic pressure is normal. Single vessel occlusive CAD. Occlusion of proximal nondominant RCA which is new since 2018. Normal LVEDP Plan: medical therapy    Labs:  CBC: Recent Labs    02/16/22 0130 02/27/22 1333 02/27/22 2248 02/28/22 0651 03/03/22 0930  WBC 8.8 6.9  --  5.6 6.8  HGB 10.3* 10.7* 10.2* 10.3* 11.3*  HCT 31.8* 35.2* 30.0* 33.3* 36.8*  PLT 303 152  --  145* 212    COAGS: Recent Labs    01/31/22 1340 01/31/22 2041 02/01/22 0414 02/01/22 1625 02/02/22 0101 02/11/22 1510 02/13/22 0922 02/27/22 1710  INR 1.5*  --   --   --   --  1.5*  --  1.0  APTT 147*   < > 33 60* 77*  --  45*  --    < > = values in this interval not displayed.    BMP: Recent Labs    02/15/22 0400 02/27/22 1333 02/27/22 2248 02/28/22 0651  03/03/22 0930  NA 133* 139 139 140 141  K 4.1 4.0 3.8 3.8 3.7  CL 100 106  --  107 105  CO2 24 22  --  22 26  GLUCOSE 137* 89  --  87 107*  BUN 20 18  --  13 15  CALCIUM 9.0 9.3  --  9.1 9.5  CREATININE 0.97 0.88  --  0.84 1.02  GFRNONAA >60 >60  --  >60 >60    LIVER FUNCTION TESTS: Recent Labs    02/01/22 0414 02/11/22 0151 02/28/22 0651 03/03/22 0930  BILITOT 0.5 0.5 0.5 0.4  AST 319* 29 48* 45*  ALT 297* 43 44 39  ALKPHOS 178* 195* 311* 287*  PROT 6.1* 6.1* 7.2 7.8  ALBUMIN 2.8* 2.0* 2.8* 3.9    TUMOR MARKERS: Recent Labs    03/03/22 0930  CEA 644.67*    Assessment and Plan:  Scheduled for Port a cath placement today Adenocarcinoma per liver lesion biopsy 02/15/22 Follows with Dr Benay Spice Appt with Oncology next week-- to start chemotherapy soon    Thank you for this interesting consult.  I greatly enjoyed meeting [name] [name] and look forward to participating in their care.  A copy of this report was sent to the requesting provider on this date.  Electronically Signed: Lavonia Drafts, PA-C 03/07/2022, 9:40 AM   I spent a total of    25 Minutes in face to face in clinical consultation, greater than 50% of which was counseling/coordinating care for [facility] placement

## 2022-03-08 ENCOUNTER — Inpatient Hospital Stay (HOSPITAL_COMMUNITY)
Admission: RE | Admit: 2022-03-08 | Discharge: 2022-03-08 | Disposition: A | Discharge status: Home / Self Care | Payer: Medicare Other | Source: Ambulatory Visit | Attending: Oncology | Admitting: Oncology

## 2022-03-08 DIAGNOSIS — R531 Weakness: Secondary | ICD-10-CM | POA: Diagnosis not present

## 2022-03-08 DIAGNOSIS — M069 Rheumatoid arthritis, unspecified: Secondary | ICD-10-CM | POA: Diagnosis not present

## 2022-03-08 DIAGNOSIS — E785 Hyperlipidemia, unspecified: Secondary | ICD-10-CM | POA: Diagnosis not present

## 2022-03-08 DIAGNOSIS — A4151 Sepsis due to Escherichia coli [E. coli]: Secondary | ICD-10-CM | POA: Diagnosis not present

## 2022-03-08 DIAGNOSIS — E876 Hypokalemia: Secondary | ICD-10-CM | POA: Diagnosis not present

## 2022-03-08 DIAGNOSIS — R918 Other nonspecific abnormal finding of lung field: Secondary | ICD-10-CM | POA: Diagnosis not present

## 2022-03-08 DIAGNOSIS — J439 Emphysema, unspecified: Secondary | ICD-10-CM | POA: Diagnosis not present

## 2022-03-08 DIAGNOSIS — S2243XD Multiple fractures of ribs, bilateral, subsequent encounter for fracture with routine healing: Secondary | ICD-10-CM | POA: Diagnosis not present

## 2022-03-08 DIAGNOSIS — C801 Malignant (primary) neoplasm, unspecified: Secondary | ICD-10-CM | POA: Insufficient documentation

## 2022-03-08 DIAGNOSIS — I959 Hypotension, unspecified: Secondary | ICD-10-CM | POA: Diagnosis not present

## 2022-03-08 DIAGNOSIS — C772 Secondary and unspecified malignant neoplasm of intra-abdominal lymph nodes: Secondary | ICD-10-CM | POA: Diagnosis not present

## 2022-03-08 DIAGNOSIS — I700 Atherosclerosis of aorta: Secondary | ICD-10-CM | POA: Diagnosis not present

## 2022-03-08 DIAGNOSIS — I248 Other forms of acute ischemic heart disease: Secondary | ICD-10-CM | POA: Diagnosis not present

## 2022-03-08 DIAGNOSIS — K859 Acute pancreatitis without necrosis or infection, unspecified: Secondary | ICD-10-CM | POA: Diagnosis not present

## 2022-03-08 DIAGNOSIS — I25110 Atherosclerotic heart disease of native coronary artery with unstable angina pectoris: Secondary | ICD-10-CM | POA: Diagnosis not present

## 2022-03-08 DIAGNOSIS — I517 Cardiomegaly: Secondary | ICD-10-CM | POA: Diagnosis not present

## 2022-03-08 DIAGNOSIS — Z8616 Personal history of COVID-19: Secondary | ICD-10-CM | POA: Diagnosis not present

## 2022-03-08 DIAGNOSIS — I739 Peripheral vascular disease, unspecified: Secondary | ICD-10-CM | POA: Diagnosis not present

## 2022-03-08 DIAGNOSIS — D638 Anemia in other chronic diseases classified elsewhere: Secondary | ICD-10-CM | POA: Diagnosis not present

## 2022-03-08 DIAGNOSIS — I119 Hypertensive heart disease without heart failure: Secondary | ICD-10-CM | POA: Diagnosis not present

## 2022-03-08 DIAGNOSIS — G8929 Other chronic pain: Secondary | ICD-10-CM | POA: Diagnosis not present

## 2022-03-08 DIAGNOSIS — Z8501 Personal history of malignant neoplasm of esophagus: Secondary | ICD-10-CM | POA: Diagnosis not present

## 2022-03-08 DIAGNOSIS — K863 Pseudocyst of pancreas: Secondary | ICD-10-CM | POA: Diagnosis not present

## 2022-03-08 DIAGNOSIS — C259 Malignant neoplasm of pancreas, unspecified: Secondary | ICD-10-CM | POA: Diagnosis not present

## 2022-03-08 DIAGNOSIS — S2220XD Unspecified fracture of sternum, subsequent encounter for fracture with routine healing: Secondary | ICD-10-CM | POA: Diagnosis not present

## 2022-03-08 DIAGNOSIS — R509 Fever, unspecified: Secondary | ICD-10-CM | POA: Diagnosis not present

## 2022-03-08 DIAGNOSIS — C159 Malignant neoplasm of esophagus, unspecified: Secondary | ICD-10-CM | POA: Diagnosis not present

## 2022-03-08 DIAGNOSIS — K861 Other chronic pancreatitis: Secondary | ICD-10-CM | POA: Diagnosis not present

## 2022-03-08 DIAGNOSIS — F32A Depression, unspecified: Secondary | ICD-10-CM | POA: Diagnosis not present

## 2022-03-08 DIAGNOSIS — A419 Sepsis, unspecified organism: Secondary | ICD-10-CM | POA: Diagnosis not present

## 2022-03-08 DIAGNOSIS — C787 Secondary malignant neoplasm of liver and intrahepatic bile duct: Secondary | ICD-10-CM | POA: Diagnosis not present

## 2022-03-08 DIAGNOSIS — J432 Centrilobular emphysema: Secondary | ICD-10-CM | POA: Diagnosis not present

## 2022-03-08 DIAGNOSIS — G9341 Metabolic encephalopathy: Secondary | ICD-10-CM | POA: Diagnosis not present

## 2022-03-08 LAB — GLUCOSE, CAPILLARY: Glucose-Capillary: 124 mg/dL — ABNORMAL HIGH (ref 70–99)

## 2022-03-08 MED ORDER — FLUDEOXYGLUCOSE F - 18 (FDG) INJECTION
7.7200 | Freq: Once | INTRAVENOUS | Status: AC | PRN
  Administered 2022-03-08: 7.72 via INTRAVENOUS

## 2022-03-09 ENCOUNTER — Other Ambulatory Visit: Payer: Self-pay | Admitting: Oncology

## 2022-03-11 ENCOUNTER — Other Ambulatory Visit: Payer: Self-pay

## 2022-03-11 ENCOUNTER — Inpatient Hospital Stay (HOSPITAL_COMMUNITY)
Admission: EM | Admit: 2022-03-11 | Discharge: 2022-03-18 | DRG: 871 | Disposition: A | Discharge status: Home Health | Payer: Medicare Other | Attending: Internal Medicine | Admitting: Internal Medicine

## 2022-03-11 ENCOUNTER — Emergency Department (HOSPITAL_COMMUNITY): Payer: Medicare Other

## 2022-03-11 DIAGNOSIS — M069 Rheumatoid arthritis, unspecified: Secondary | ICD-10-CM | POA: Diagnosis present

## 2022-03-11 DIAGNOSIS — R338 Other retention of urine: Secondary | ICD-10-CM | POA: Diagnosis present

## 2022-03-11 DIAGNOSIS — I700 Atherosclerosis of aorta: Secondary | ICD-10-CM | POA: Diagnosis not present

## 2022-03-11 DIAGNOSIS — G8929 Other chronic pain: Secondary | ICD-10-CM | POA: Diagnosis present

## 2022-03-11 DIAGNOSIS — R509 Fever, unspecified: Secondary | ICD-10-CM | POA: Diagnosis not present

## 2022-03-11 DIAGNOSIS — F32A Depression, unspecified: Secondary | ICD-10-CM | POA: Diagnosis present

## 2022-03-11 DIAGNOSIS — Z743 Need for continuous supervision: Secondary | ICD-10-CM | POA: Diagnosis not present

## 2022-03-11 DIAGNOSIS — R339 Retention of urine, unspecified: Secondary | ICD-10-CM | POA: Diagnosis present

## 2022-03-11 DIAGNOSIS — I248 Other forms of acute ischemic heart disease: Secondary | ICD-10-CM | POA: Diagnosis present

## 2022-03-11 DIAGNOSIS — I517 Cardiomegaly: Secondary | ICD-10-CM | POA: Diagnosis not present

## 2022-03-11 DIAGNOSIS — R6510 Systemic inflammatory response syndrome (SIRS) of non-infectious origin without acute organ dysfunction: Secondary | ICD-10-CM | POA: Diagnosis not present

## 2022-03-11 DIAGNOSIS — Z8501 Personal history of malignant neoplasm of esophagus: Secondary | ICD-10-CM | POA: Diagnosis not present

## 2022-03-11 DIAGNOSIS — Z86718 Personal history of other venous thrombosis and embolism: Secondary | ICD-10-CM

## 2022-03-11 DIAGNOSIS — K859 Acute pancreatitis without necrosis or infection, unspecified: Secondary | ICD-10-CM | POA: Diagnosis present

## 2022-03-11 DIAGNOSIS — Z79899 Other long term (current) drug therapy: Secondary | ICD-10-CM

## 2022-03-11 DIAGNOSIS — Z951 Presence of aortocoronary bypass graft: Secondary | ICD-10-CM

## 2022-03-11 DIAGNOSIS — K219 Gastro-esophageal reflux disease without esophagitis: Secondary | ICD-10-CM | POA: Diagnosis present

## 2022-03-11 DIAGNOSIS — Z955 Presence of coronary angioplasty implant and graft: Secondary | ICD-10-CM

## 2022-03-11 DIAGNOSIS — I25110 Atherosclerotic heart disease of native coronary artery with unstable angina pectoris: Secondary | ICD-10-CM | POA: Diagnosis present

## 2022-03-11 DIAGNOSIS — D638 Anemia in other chronic diseases classified elsewhere: Secondary | ICD-10-CM | POA: Diagnosis present

## 2022-03-11 DIAGNOSIS — S2220XD Unspecified fracture of sternum, subsequent encounter for fracture with routine healing: Secondary | ICD-10-CM | POA: Diagnosis not present

## 2022-03-11 DIAGNOSIS — A4151 Sepsis due to Escherichia coli [E. coli]: Secondary | ICD-10-CM | POA: Diagnosis present

## 2022-03-11 DIAGNOSIS — Z86711 Personal history of pulmonary embolism: Secondary | ICD-10-CM

## 2022-03-11 DIAGNOSIS — I959 Hypotension, unspecified: Secondary | ICD-10-CM | POA: Diagnosis present

## 2022-03-11 DIAGNOSIS — R531 Weakness: Secondary | ICD-10-CM | POA: Diagnosis not present

## 2022-03-11 DIAGNOSIS — M5137 Other intervertebral disc degeneration, lumbosacral region: Secondary | ICD-10-CM | POA: Diagnosis present

## 2022-03-11 DIAGNOSIS — Z89612 Acquired absence of left leg above knee: Secondary | ICD-10-CM

## 2022-03-11 DIAGNOSIS — C159 Malignant neoplasm of esophagus, unspecified: Secondary | ICD-10-CM | POA: Diagnosis present

## 2022-03-11 DIAGNOSIS — E785 Hyperlipidemia, unspecified: Secondary | ICD-10-CM | POA: Diagnosis present

## 2022-03-11 DIAGNOSIS — Z87891 Personal history of nicotine dependence: Secondary | ICD-10-CM

## 2022-03-11 DIAGNOSIS — Z7902 Long term (current) use of antithrombotics/antiplatelets: Secondary | ICD-10-CM

## 2022-03-11 DIAGNOSIS — C787 Secondary malignant neoplasm of liver and intrahepatic bile duct: Secondary | ICD-10-CM | POA: Diagnosis present

## 2022-03-11 DIAGNOSIS — Z8249 Family history of ischemic heart disease and other diseases of the circulatory system: Secondary | ICD-10-CM

## 2022-03-11 DIAGNOSIS — E876 Hypokalemia: Secondary | ICD-10-CM | POA: Diagnosis present

## 2022-03-11 DIAGNOSIS — G9341 Metabolic encephalopathy: Secondary | ICD-10-CM | POA: Diagnosis present

## 2022-03-11 DIAGNOSIS — T447X6A Underdosing of beta-adrenoreceptor antagonists, initial encounter: Secondary | ICD-10-CM | POA: Diagnosis present

## 2022-03-11 DIAGNOSIS — J432 Centrilobular emphysema: Secondary | ICD-10-CM | POA: Diagnosis not present

## 2022-03-11 DIAGNOSIS — Z8673 Personal history of transient ischemic attack (TIA), and cerebral infarction without residual deficits: Secondary | ICD-10-CM

## 2022-03-11 DIAGNOSIS — Z885 Allergy status to narcotic agent status: Secondary | ICD-10-CM

## 2022-03-11 DIAGNOSIS — K863 Pseudocyst of pancreas: Secondary | ICD-10-CM | POA: Diagnosis not present

## 2022-03-11 DIAGNOSIS — J439 Emphysema, unspecified: Secondary | ICD-10-CM | POA: Diagnosis present

## 2022-03-11 DIAGNOSIS — K861 Other chronic pancreatitis: Secondary | ICD-10-CM | POA: Diagnosis present

## 2022-03-11 DIAGNOSIS — I739 Peripheral vascular disease, unspecified: Secondary | ICD-10-CM | POA: Diagnosis present

## 2022-03-11 DIAGNOSIS — F419 Anxiety disorder, unspecified: Secondary | ICD-10-CM | POA: Diagnosis present

## 2022-03-11 DIAGNOSIS — Z8616 Personal history of COVID-19: Secondary | ICD-10-CM

## 2022-03-11 DIAGNOSIS — I119 Hypertensive heart disease without heart failure: Secondary | ICD-10-CM | POA: Diagnosis present

## 2022-03-11 DIAGNOSIS — I252 Old myocardial infarction: Secondary | ICD-10-CM

## 2022-03-11 DIAGNOSIS — Z8674 Personal history of sudden cardiac arrest: Secondary | ICD-10-CM

## 2022-03-11 DIAGNOSIS — R0689 Other abnormalities of breathing: Secondary | ICD-10-CM | POA: Diagnosis not present

## 2022-03-11 DIAGNOSIS — S2243XD Multiple fractures of ribs, bilateral, subsequent encounter for fracture with routine healing: Secondary | ICD-10-CM | POA: Diagnosis not present

## 2022-03-11 DIAGNOSIS — R6889 Other general symptoms and signs: Secondary | ICD-10-CM | POA: Diagnosis not present

## 2022-03-11 DIAGNOSIS — R160 Hepatomegaly, not elsewhere classified: Secondary | ICD-10-CM | POA: Diagnosis not present

## 2022-03-11 DIAGNOSIS — K7290 Hepatic failure, unspecified without coma: Secondary | ICD-10-CM | POA: Diagnosis not present

## 2022-03-11 DIAGNOSIS — C772 Secondary and unspecified malignant neoplasm of intra-abdominal lymph nodes: Secondary | ICD-10-CM | POA: Diagnosis present

## 2022-03-11 DIAGNOSIS — Z7901 Long term (current) use of anticoagulants: Secondary | ICD-10-CM

## 2022-03-11 DIAGNOSIS — D649 Anemia, unspecified: Secondary | ICD-10-CM | POA: Diagnosis present

## 2022-03-11 DIAGNOSIS — I2510 Atherosclerotic heart disease of native coronary artery without angina pectoris: Secondary | ICD-10-CM | POA: Diagnosis present

## 2022-03-11 DIAGNOSIS — A419 Sepsis, unspecified organism: Secondary | ICD-10-CM | POA: Diagnosis not present

## 2022-03-11 DIAGNOSIS — C259 Malignant neoplasm of pancreas, unspecified: Secondary | ICD-10-CM | POA: Diagnosis present

## 2022-03-11 DIAGNOSIS — C801 Malignant (primary) neoplasm, unspecified: Secondary | ICD-10-CM | POA: Diagnosis present

## 2022-03-11 DIAGNOSIS — Z888 Allergy status to other drugs, medicaments and biological substances status: Secondary | ICD-10-CM

## 2022-03-11 DIAGNOSIS — Z7982 Long term (current) use of aspirin: Secondary | ICD-10-CM

## 2022-03-11 DIAGNOSIS — G4700 Insomnia, unspecified: Secondary | ICD-10-CM | POA: Diagnosis present

## 2022-03-11 LAB — CBC WITH DIFFERENTIAL/PLATELET
Abs Immature Granulocytes: 0.01 10*3/uL (ref 0.00–0.07)
Basophils Absolute: 0 10*3/uL (ref 0.0–0.1)
Basophils Relative: 0 %
Eosinophils Absolute: 0 10*3/uL (ref 0.0–0.5)
Eosinophils Relative: 1 %
HCT: 27.8 % — ABNORMAL LOW (ref 39.0–52.0)
Hemoglobin: 8.5 g/dL — ABNORMAL LOW (ref 13.0–17.0)
Immature Granulocytes: 0 %
Lymphocytes Relative: 14 %
Lymphs Abs: 0.7 10*3/uL (ref 0.7–4.0)
MCH: 26.5 pg (ref 26.0–34.0)
MCHC: 30.6 g/dL (ref 30.0–36.0)
MCV: 86.6 fL (ref 80.0–100.0)
Monocytes Absolute: 0.4 10*3/uL (ref 0.1–1.0)
Monocytes Relative: 8 %
Neutro Abs: 4.1 10*3/uL (ref 1.7–7.7)
Neutrophils Relative %: 77 %
Platelets: 168 10*3/uL (ref 150–400)
RBC: 3.21 MIL/uL — ABNORMAL LOW (ref 4.22–5.81)
RDW: 16.9 % — ABNORMAL HIGH (ref 11.5–15.5)
WBC: 5.3 10*3/uL (ref 4.0–10.5)
nRBC: 0 % (ref 0.0–0.2)

## 2022-03-11 LAB — COMPREHENSIVE METABOLIC PANEL
ALT: 29 U/L (ref 0–44)
AST: 36 U/L (ref 15–41)
Albumin: 2.9 g/dL — ABNORMAL LOW (ref 3.5–5.0)
Alkaline Phosphatase: 241 U/L — ABNORMAL HIGH (ref 38–126)
Anion gap: 7 (ref 5–15)
BUN: 16 mg/dL (ref 8–23)
CO2: 24 mmol/L (ref 22–32)
Calcium: 8.3 mg/dL — ABNORMAL LOW (ref 8.9–10.3)
Chloride: 105 mmol/L (ref 98–111)
Creatinine, Ser: 1.02 mg/dL (ref 0.61–1.24)
GFR, Estimated: >60 mL/min (ref >60)
Glucose, Bld: 121 mg/dL — ABNORMAL HIGH (ref 70–99)
Potassium: 3.2 mmol/L — ABNORMAL LOW (ref 3.5–5.1)
Sodium: 136 mmol/L (ref 135–145)
Total Bilirubin: 0.6 mg/dL (ref 0.3–1.2)
Total Protein: 7.3 g/dL (ref 6.5–8.1)

## 2022-03-11 LAB — I-STAT CHEM 8, ED
BUN: 14 mg/dL (ref 8–23)
Calcium, Ion: 1.12 mmol/L — ABNORMAL LOW (ref 1.15–1.40)
Chloride: 103 mmol/L (ref 98–111)
Creatinine, Ser: 1 mg/dL (ref 0.61–1.24)
Glucose, Bld: 99 mg/dL (ref 70–99)
HCT: 28 % — ABNORMAL LOW (ref 39.0–52.0)
Hemoglobin: 9.5 g/dL — ABNORMAL LOW (ref 13.0–17.0)
Potassium: 3.4 mmol/L — ABNORMAL LOW (ref 3.5–5.1)
Sodium: 138 mmol/L (ref 135–145)
TCO2: 24 mmol/L (ref 22–32)

## 2022-03-11 LAB — PROTIME-INR
INR: 1.6 — ABNORMAL HIGH (ref 0.8–1.2)
Prothrombin Time: 18.5 seconds — ABNORMAL HIGH (ref 11.4–15.2)

## 2022-03-11 LAB — LACTIC ACID, PLASMA
Lactic Acid, Venous: 1.5 mmol/L (ref 0.5–1.9)
Lactic Acid, Venous: 1.6 mmol/L (ref 0.5–1.9)

## 2022-03-11 LAB — LIPASE, BLOOD: Lipase: 98 U/L — ABNORMAL HIGH (ref 11–51)

## 2022-03-11 LAB — MAGNESIUM: Magnesium: 1.7 mg/dL (ref 1.7–2.4)

## 2022-03-11 LAB — TROPONIN I (HIGH SENSITIVITY)
Troponin I (High Sensitivity): 63 ng/L — ABNORMAL HIGH (ref <18)
Troponin I (High Sensitivity): 76 ng/L — ABNORMAL HIGH (ref <18)

## 2022-03-11 MED ORDER — METRONIDAZOLE 500 MG/100ML IV SOLN
500.0000 mg | Freq: Once | INTRAVENOUS | Status: AC
  Administered 2022-03-11: 500 mg via INTRAVENOUS
  Filled 2022-03-11: qty 100

## 2022-03-11 MED ORDER — SODIUM CHLORIDE 0.9 % IV SOLN
2.0000 g | Freq: Once | INTRAVENOUS | Status: AC
  Administered 2022-03-11: 2 g via INTRAVENOUS
  Filled 2022-03-11: qty 12.5

## 2022-03-11 MED ORDER — VANCOMYCIN HCL IN DEXTROSE 1-5 GM/200ML-% IV SOLN
1000.0000 mg | Freq: Once | INTRAVENOUS | Status: DC
  Filled 2022-03-11: qty 200

## 2022-03-11 MED ORDER — FENTANYL CITRATE PF 50 MCG/ML IJ SOSY
25.0000 ug | PREFILLED_SYRINGE | Freq: Once | INTRAMUSCULAR | Status: AC
  Administered 2022-03-11: 25 ug via INTRAVENOUS
  Filled 2022-03-11: qty 1

## 2022-03-11 MED ORDER — NOREPINEPHRINE 4 MG/250ML-% IV SOLN
0.0000 ug/min | INTRAVENOUS | Status: DC
  Filled 2022-03-11: qty 250

## 2022-03-11 MED ORDER — LACTATED RINGERS IV BOLUS (SEPSIS)
1000.0000 mL | Freq: Once | INTRAVENOUS | Status: AC
  Administered 2022-03-11: 1000 mL via INTRAVENOUS

## 2022-03-11 MED ORDER — LACTATED RINGERS IV SOLN: INTRAVENOUS | Status: AC

## 2022-03-11 MED ORDER — POTASSIUM CHLORIDE 10 MEQ/100ML IV SOLN
10.0000 meq | INTRAVENOUS | Status: AC
  Administered 2022-03-12 (×4): 10 meq via INTRAVENOUS
  Filled 2022-03-11 (×4): qty 100

## 2022-03-11 MED ORDER — VANCOMYCIN HCL 1500 MG/300ML IV SOLN
1500.0000 mg | Freq: Once | INTRAVENOUS | Status: AC
  Administered 2022-03-11: 1500 mg via INTRAVENOUS
  Filled 2022-03-11: qty 300

## 2022-03-11 NOTE — H&P (Signed)
History and Physical    CLEOFAS HUDGINS HCW:[id number] DOB: [birth year] DOA: 03/11/2022  PCP: Angelique Blonder, DO  Patient coming from: Home via EMS  I have personally briefly reviewed patient's old medical records in [place]  Chief Complaint: Altered mental status, fever  HPI: [name] [name] is a 61 y.o. 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  ***  Review of Systems:  ***All systems reviewed and are negative except as documented in history of present illness above.   Past Medical History:  Diagnosis Date   Abnormal CT scan, gastrointestinal tract    Acute respiratory failure ([place]) 01/31/2022   Acute respiratory failure with hypoxemia (HCC) 02/11/2022   Anemia    Anemia 08/06/2012   Aortic valve mass 12/30/2015   Bilateral shoulder pain 08/20/2012   Bite wound of left hand 09/27/2021   Cardiogenic shock ([place]) 01/31/2022   Community acquired pneumonia 11/07/2011   Critical limb ischemia with history of revascularization of same extremity ([place]) 04/28/2020   DDD (degenerative disc disease), lumbosacral     and grade 2 slip   DVT (deep venous thrombosis) ([place]) 01/31/2022   GERD (gastroesophageal reflux disease)    H/O: stroke 01/31/2022   Heart murmur    History of anemia    no current med.   History of COVID-19 05/10/2019   History of critical lower limb ischemia 02/19/2019   Hyperglycemia 01/31/2022   Hypertension    states is borderline on medication; has been on med. x 5-6 yr.    Hypokalemia 05/30/2018   Impingement syndrome of shoulder region 10/2014   left   Insomnia 06/01/2015   Ischemic ulcer of toe of left foot (HCC)    Lactic acidosis 01/31/2022   Left shoulder pain 12/16/2015   Liver mass    Low back pain without sciatica 10/29/2009   Lupus ([place])    Multiple fractures of ribs, bilateral, due to CPR trauma 01/31/2022   Onychomycosis 01/28/2021   Paronychia of great toe 01/28/2021   Peripheral vascular disease ([place])    Pneumonia    Pressure injury of skin 01/31/2022   Pulmonary contusion 01/31/2022   RA (rheumatoid arthritis) ([place])    Rotator cuff tear 11/04/2014   STEMI (ST elevation myocardial infarction) ([place]) 01/31/2022   Sternal fracture 01/31/2022   Transaminitis 12/16/2015    Past Surgical History:  Procedure Laterality Date   ABDOMINAL AORTAGRAM  02/20/2019   ABDOMINAL AORTOGRAM W/LOWER EXTREMITY N/A 02/20/2019   Procedure: ABDOMINAL AORTOGRAM W/LOWER EXTREMITY;  Surgeon: Marty Heck, MD;  Location: [facility];  Service: Cardiovascular;  Laterality: N/A;   ACHILLES TENDON SURGERY Bilateral    AMPUTATION Left 05/11/2020   Procedure: LEFT ABOVE KNEE AMPUTATION;  Surgeon: Angelia Mould, MD;  Location: [facility];  Service: Vascular;  Laterality: Left;   ENDARTERECTOMY POPLITEAL Left 05/01/2020   Procedure: ENDARTERECTOMY POPLITEAL;  Surgeon: Waynetta Sandy, MD;  Location: [facility];  Service: Vascular;  Laterality: Left;   FASCIOTOMY Left 05/01/2020   Procedure: FASCIOTOMY;  Surgeon: Waynetta Sandy, MD;  Location: [facility];  Service: Vascular;  Laterality: Left;   FEMORAL-POPLITEAL BYPASS GRAFT Left 02/26/2019   Procedure: BYPASS GRAFT FEMORAL-POPLITEAL ARTERY LEFT LEG USING 54m PROPATEN GRAFT;  Surgeon: BSerafina Mitchell MD;  [place] [facility]  Service: Vascular;  Laterality: Left;   INTRAOPERATIVE ARTERIOGRAM Left 01/22/2020   Procedure: INTRA OPERATIVE ARTERIOGRAM with administration of thrombolyics in Left femoral to popliteal  bypass.;  Surgeon: CMarty Heck MD;  [place] [facility]  Service: Vascular;  Laterality: Left;   IR IMAGING GUIDED PORT INSERTION  03/07/2022   LEFT HEART CATH AND CORONARY ANGIOGRAPHY N/A 03/15/2017   Procedure: LEFT HEART CATH AND CORONARY ANGIOGRAPHY;  Surgeon: ENelva Bush MD;  [facility];  Service: Cardiovascular;  Laterality: N/A;   LEFT HEART CATH AND CORONARY ANGIOGRAPHY N/A 02/06/2022   Procedure: LEFT HEART CATH AND CORONARY ANGIOGRAPHY;  Surgeon: JMartinique Peter M, MD;  [facility];  Service: Cardiovascular;  Laterality: N/A;   LOWER EXTREMITY INTERVENTION Left 04/29/2020   Procedure: LOWER EXTREMITY INTERVENTION- LYSIS;  Surgeon: CMarty Heck MD;  [facility];  Service: Cardiovascular;  Laterality: Left;   OSTEOTOMY AND ULNAR SHORTENING Right 07/22/2002   PATCH ANGIOPLASTY Left 05/01/2020   Procedure: PATCH ANGIOPLASTY USING HEMASHIELD PLATINUM FINESSE PATCH;  Surgeon: CWaynetta Sandy MD;  [place] [facility]  Service: Vascular;  Laterality: Left;   PERIPHERAL VASCULAR ATHERECTOMY  01/23/2020   Procedure: PERIPHERAL VASCULAR ATHERECTOMY;  Surgeon: CMarty Heck MD;  [facility];  Service: Cardiovascular;;   PERIPHERAL VASCULAR BALLOON ANGIOPLASTY  01/23/2020   Procedure: PERIPHERAL VASCULAR BALLOON ANGIOPLASTY;  Surgeon: CMarty Heck MD;  [facility];  Service: Cardiovascular;;   PERIPHERAL VASCULAR INTERVENTION Left 04/30/2020   Procedure: PERIPHERAL VASCULAR INTERVENTION;  Surgeon: HCherre Robins MD;  [facility];  Service: Cardiovascular;  Laterality: Left;   PERIPHERAL VASCULAR THROMBECTOMY N/A 01/23/2020   Procedure: lysis recheck;  Surgeon: CMarty Heck MD;  [facility];  Service: Cardiovascular;  Laterality: N/A;  + [place]    PERIPHERAL VASCULAR THROMBECTOMY N/A 04/30/2020   Procedure: Lysis Recheck;  Surgeon:  HCherre Robins MD;  [facility];  Service: Cardiovascular;  Laterality: N/A;   SHOULDER ARTHROSCOPY WITH BICEPSTENOTOMY Right 03/11/2015   Procedure: SHOULDER ARTHROSCOPY WITH BICEPSTENOTOMY;  Surgeon: DNinetta Lights MD;  [place] [facility]  Service: Orthopedics;  Laterality: Right;   SHOULDER ARTHROSCOPY WITH DISTAL CLAVICLE RESECTION Left 11/19/2014   Procedure: SHOULDER ARTHROSCOPY WITH DISTAL CLAVICLE RESECTION;  Surgeon: DKathryne Hitch MD;  [place] [facility]  Service: Orthopedics;  Laterality: Left;   SHOULDER ARTHROSCOPY WITH ROTATOR CUFF REPAIR AND SUBACROMIAL DECOMPRESSION Left 11/19/2014   Procedure: LEFT SHOULDER ARTHROSCOPY, DEBRIDEMENT DISTAL CLAVICLE EXCISION, ACROMIOPLASTY WITH ROTATOR CUFF REPAIR ;  Surgeon: DKathryne Hitch MD;  [place] [facility]  Service: Orthopedics;  Laterality: Left;   THROMBECTOMY OF BYPASS GRAFT FEMORAL- POPLITEAL ARTERY Left 05/01/2020   Procedure: THROMBECTOMY OF LOWER EXTREMITY;  Surgeon: CWaynetta Sandy MD;  [place] [facility]  Service: Vascular;  Laterality: Left;   TRANSFORAMINAL LUMBAR INTERBODY FUSION (TLIF) WITH PEDICLE SCREW FIXATION 1 LEVEL N/A 01/03/2018   Procedure: TRANSFORAMINAL LUMBAR INTERBODY FUSION (TLIF) LUMBAR FIVE-SACRAL ONE;  Surgeon: BMelina Schools MD;  [place] [facility]  Service: Orthopedics;  Laterality: N/A;   ULTRASOUND GUIDANCE FOR VASCULAR ACCESS Right 01/22/2020   Procedure: ULTRASOUND GUIDANCE FOR VASCULAR ACCESS Right Common Femoral Artery.;  Surgeon: Marty Heck, MD;  [facility];  Service: Vascular;  Laterality: Right;   VIDEO ASSISTED THORACOSCOPY (VATS)/DECORTICATION Right 11/21/2011   drainage of empyema    Social History:  reports that he quit smoking about 3 years ago. His smoking use included cigarettes. He has a 5.00 pack-year smoking history. He has never used smokeless tobacco. He reports that he does not drink alcohol and does  not use drugs.  Allergies  Allergen Reactions   Dilaudid [Hydromorphone Hcl] Rash   Ramipril Rash and Other (See Comments)    Per patient on R arm and leg only; no angioedema   Tramadol Itching and Rash    Family History  Problem Relation Age of Onset   Heart attack Father 46   Alcohol abuse Father    Aneurysm Mother    Stroke Neg Hx    Cancer Neg Hx      Prior to Admission medications   Medication Sig Start Date End Date Taking? Authorizing Provider  apixaban (ELIQUIS) 5 MG TABS tablet Take 1 tablet (5 mg total) by mouth 2 (two) times daily. 02/28/22  Yes Mapp, Tavien, MD  atorvastatin (LIPITOR) 80 MG tablet Take 1 tablet (80 mg total) by mouth daily at 6 PM. 02/20/22   Serita Butcher, MD  clopidogrel (PLAVIX) 75 MG tablet Take 1 tablet (75 mg total) by mouth daily. 07/18/21 07/18/22  Delene Ruffini, MD  diclofenac Sodium (VOLTAREN) 1 % GEL Apply 2-4 g topically in the morning, at noon, in the evening, and at bedtime. 12/26/21   [provider]  docusate sodium (COLACE) 100 MG capsule Take 1 capsule (100 mg total) by mouth daily. 01/27/21   Lyndal Pulley, MD  DULoxetine (CYMBALTA) 30 MG capsule Take 30 mg by mouth daily. 12/26/21   [provider]  gabapentin (NEURONTIN) 100 MG capsule Take 100 mg by mouth at bedtime as needed (for pain).  12/31/19   [provider]  lidocaine (LIDODERM) 5 % Place 1 patch onto the skin daily. Remove & Discard patch within 12 hours or as directed by MD For chest wall pain Patient not taking: Reported on 02/22/2022 02/09/22   Florencia Reasons, MD  lidocaine-prilocaine (EMLA) cream Apply 1 Application topically as needed. 03/03/22   Owens Shark, NP  metoprolol tartrate (LOPRESSOR) 25 MG tablet Take 0.5 tablets (12.5 mg total) by mouth 2 (two) times daily. Patient not taking: Reported on 02/27/2022 02/07/22   Charlynne Cousins, MD  naloxone [facility]) nasal spray 4 mg/0.1 mL Place 1 spray into the nose as needed. Patient not taking: Reported  on 02/22/2022 12/26/21   [provider]  ondansetron (ZOFRAN) 8 MG tablet Take 1 tablet (8 mg total) by mouth every 8 (eight) hours as needed for nausea or vomiting. 03/03/22   Owens Shark, NP  oxyCODONE-acetaminophen (PERCOCET) 10-325 MG tablet Take 1 tablet by mouth every 6 (six) hours as needed for pain. 03/02/22 04/01/22  Idamae Schuller, MD  prochlorperazine (COMPAZINE) 10 MG tablet Take 1 tablet (10 mg total) by mouth every 6 (six) hours as needed for nausea or vomiting. 03/03/22   Owens Shark, NP    Physical Exam: Vitals:   03/11/22 2130 03/11/22 2200 03/11/22 2230 03/11/22 2300  BP: (!) 95/51 (!) 104/57 116/68 122/75  Pulse: 95 93 92 89  Resp: '12 14 10 '$ (!) 24  Temp:      TempSrc:      SpO2: 94% 93% 98% 100%  Weight:  Height:       *** Constitutional: NAD, calm, comfortable Eyes: PERRL, lids and conjunctivae normal ENMT: Mucous membranes are moist. Posterior pharynx clear of any exudate or lesions.Normal dentition.  Neck: normal, supple, no masses. Respiratory: clear to auscultation bilaterally, no wheezing, no crackles. Normal respiratory effort. No accessory muscle use.  Cardiovascular: Regular rate and rhythm, no murmurs / rubs / gallops. No extremity edema. 2+ pedal pulses. Abdomen: no tenderness, no masses palpated. No hepatosplenomegaly. Bowel sounds positive.  Musculoskeletal: no clubbing / cyanosis. No joint deformity upper and lower extremities. Good ROM, no contractures. Normal muscle tone.  Skin: no rashes, lesions, ulcers. No induration Neurologic: CN 2-12 grossly intact. Sensation intact. Strength 5/5 in all 4.  Psychiatric: Normal judgment and insight. Alert and oriented x 3. Normal mood.   EKG: Personally reviewed. ***  Assessment/Plan Principal Problem:   Acute metabolic encephalopathy   *** No notes on file *** Assessment and Plan: No notes have been filed under this hospital service. Service: Hospitalist DVT prophylaxis: ***  Code Status:  ***  Family Communication: ***  Disposition Plan: ***  Consults called: ***  Severity of Illness: {Observation/Inpatient:21159}  Zada Finders MD [facility]  If 7PM-7AM, please contact night-coverage [email or web address]  03/11/2022, 11:33 PM

## 2022-03-11 NOTE — ED Notes (Signed)
Pt. Gone for CT

## 2022-03-11 NOTE — Sepsis Progress Note (Signed)
Elink monitoring For Sepsis Protocol

## 2022-03-11 NOTE — Progress Notes (Signed)
A consult was received from an ED physician for vancomycin and cefepime per pharmacy dosing.  The patient's profile has been reviewed for ht/wt/allergies/indication/available labs.    A one time order has been placed for cefepime 2 g IV once + vancomycin 1500 mg IV once.    Further antibiotics/pharmacy consults should be ordered by admitting physician if indicated.                       Thank you, Lenis Noon, PharmD 03/11/2022  9:01 PM

## 2022-03-11 NOTE — ED Triage Notes (Signed)
Patient brought in by EMS from home states roommate found patient in bed with AMS and not able to sit up. He has new DX of pancreatic cancer, he has not started chemo or radiation. Recent cardiac arrest survivor 01/31/22. EMS gave patient 1 liter of LR and '650mg'$  of Tylenol.  T- 103.3 HR- 114 RR 40-50 O2 95%- RA CBG- 140

## 2022-03-11 NOTE — ED Provider Notes (Signed)
[facility] DEPT Provider Note   CSN: [id number] Arrival date & time: 03/11/22  1841     History  Chief Complaint  Patient presents with   Code Sepsis    [name] [name] is a 61 y.o. male.  HPI Patient presents for altered mental status and generalized weakness.  Medical history includes HLD, HTN, tobacco use, CAD, PAD, left AKA, cardiac arrest, PE, DVT, CVA.  He has a new diagnosis of pancreatic cancer.  Patient lives with a roommate who called EMS due to the patient's new symptoms today.  Patient reports that he was in his normal state of health yesterday.  EMS noted fever of 103.3 degrees, tachycardia, tachypnea.  He was given 1 L bolus of LR and 650 mg of Tylenol.  Patient currently endorses continued generalized weakness.  He states that he has chronic pain in the right upper abdomen as well as his lower back.  He denies any new areas of discomfort.  He has not had any recent cough or dysuria.  He does not wear supplemental oxygen at home.  History per caregiver: Patient did receive his first dose of radiation 2 days ago.  Since that time, he has had fatigue and malaise.  He has had recent vomiting as well as increased urination.    Home Medications Prior to Admission medications   Medication Sig Start Date End Date Taking? Authorizing Provider  apixaban (ELIQUIS) 5 MG TABS tablet Take 1 tablet (5 mg total) by mouth 2 (two) times daily. 02/28/22  Yes Mapp, Tavien, MD  atorvastatin (LIPITOR) 80 MG tablet Take 1 tablet (80 mg total) by mouth daily at 6 PM. 02/20/22   Serita Butcher, MD  clopidogrel (PLAVIX) 75 MG tablet Take 1 tablet (75 mg total) by mouth daily. 07/18/21 07/18/22  Delene Ruffini, MD  diclofenac Sodium (VOLTAREN) 1 % GEL Apply 2-4 g topically in the morning, at noon, in the evening, and at bedtime. 12/26/21   [provider]  docusate sodium (COLACE) 100 MG capsule Take 1 capsule (100 mg total) by mouth daily. 01/27/21   Lyndal Pulley,  MD  DULoxetine (CYMBALTA) 30 MG capsule Take 30 mg by mouth daily. 12/26/21   [provider]  gabapentin (NEURONTIN) 100 MG capsule Take 100 mg by mouth at bedtime as needed (for pain).  12/31/19   [provider]  lidocaine (LIDODERM) 5 % Place 1 patch onto the skin daily. Remove & Discard patch within 12 hours or as directed by MD For chest wall pain Patient not taking: Reported on 02/22/2022 02/09/22   Florencia Reasons, MD  lidocaine-prilocaine (EMLA) cream Apply 1 Application topically as needed. 03/03/22   Owens Shark, NP  metoprolol tartrate (LOPRESSOR) 25 MG tablet Take 0.5 tablets (12.5 mg total) by mouth 2 (two) times daily. Patient not taking: Reported on 02/27/2022 02/07/22   Charlynne Cousins, MD  naloxone [facility]) nasal spray 4 mg/0.1 mL Place 1 spray into the nose as needed. Patient not taking: Reported on 02/22/2022 12/26/21   [provider]  ondansetron (ZOFRAN) 8 MG tablet Take 1 tablet (8 mg total) by mouth every 8 (eight) hours as needed for nausea or vomiting. 03/03/22   Owens Shark, NP  oxyCODONE-acetaminophen (PERCOCET) 10-325 MG tablet Take 1 tablet by mouth every 6 (six) hours as needed for pain. 03/02/22 04/01/22  Idamae Schuller, MD  prochlorperazine (COMPAZINE) 10 MG tablet Take 1 tablet (10 mg total) by mouth every 6 (six) hours as needed  for nausea or vomiting. 03/03/22   Owens Shark, NP      Allergies    Dilaudid [hydromorphone hcl], Ramipril, and Tramadol    Review of Systems   Review of Systems  Constitutional:  Positive for fatigue and fever.  Gastrointestinal:  Positive for abdominal distention (Chronic) and abdominal pain (Chronic).  Musculoskeletal:  Positive for back pain (Chronic).  Neurological:  Positive for weakness (Generalized).  All other systems reviewed and are negative.   Physical Exam Updated Vital Signs BP 122/75   Pulse 89   Temp 98.4 F (36.9 C) (Oral)   Resp (!) 24   Ht '5\' 7"'$  (1.702 m)   Wt 77.1 kg   SpO2 100%   BMI  26.63 kg/m  Physical Exam Vitals and nursing note reviewed.  Constitutional:      General: He is not in acute distress.    Appearance: He is well-developed and normal weight. He is ill-appearing and diaphoretic.  HENT:     Head: Normocephalic and atraumatic.     Right Ear: External ear normal.     Left Ear: External ear normal.     Nose: Nose normal.     Mouth/Throat:     Mouth: Mucous membranes are moist.     Pharynx: Oropharynx is clear.  Eyes:     Extraocular Movements: Extraocular movements intact.     Conjunctiva/sclera: Conjunctivae normal.  Cardiovascular:     Rate and Rhythm: Normal rate and regular rhythm.     Heart sounds: No murmur heard. Pulmonary:     Effort: Pulmonary effort is normal. No respiratory distress.     Breath sounds: Normal breath sounds. No wheezing, rhonchi or rales.  Chest:     Chest wall: No tenderness.  Abdominal:     General: There is distension.     Palpations: Abdomen is soft.     Tenderness: There is no guarding or rebound.  Musculoskeletal:        General: No swelling or deformity.     Cervical back: Normal range of motion and neck supple.     Right lower leg: No edema.     Comments: Left AKA  Skin:    General: Skin is warm.     Capillary Refill: Capillary refill takes less than 2 seconds.     Coloration: Skin is not jaundiced or pale.  Neurological:     General: No focal deficit present.     Mental Status: He is alert and oriented to person, place, and time.     Cranial Nerves: No cranial nerve deficit.     Sensory: No sensory deficit.     Motor: No weakness.     Coordination: Coordination normal.  Psychiatric:        Mood and Affect: Mood normal.        Behavior: Behavior normal.        Thought Content: Thought content normal.        Judgment: Judgment normal.     ED Results / Procedures / Treatments   Labs (all labs ordered are listed, but only abnormal results are displayed) Labs Reviewed  COMPREHENSIVE METABOLIC PANEL  - Abnormal; Notable for the following components:      Result Value   Potassium 3.2 (*)    Glucose, Bld 121 (*)    Calcium 8.3 (*)    Albumin 2.9 (*)    Alkaline Phosphatase 241 (*)    All other components within normal limits  CBC WITH DIFFERENTIAL/PLATELET -  Abnormal; Notable for the following components:   RBC 3.21 (*)    Hemoglobin 8.5 (*)    HCT 27.8 (*)    RDW 16.9 (*)    All other components within normal limits  LIPASE, BLOOD - Abnormal; Notable for the following components:   Lipase 98 (*)    All other components within normal limits  PROTIME-INR - Abnormal; Notable for the following components:   Prothrombin Time 18.5 (*)    INR 1.6 (*)    All other components within normal limits  I-STAT CHEM 8, ED - Abnormal; Notable for the following components:   Potassium 3.4 (*)    Calcium, Ion 1.12 (*)    Hemoglobin 9.5 (*)    HCT 28.0 (*)    All other components within normal limits  TROPONIN I (HIGH SENSITIVITY) - Abnormal; Notable for the following components:   Troponin I (High Sensitivity) 63 (*)    All other components within normal limits  TROPONIN I (HIGH SENSITIVITY) - Abnormal; Notable for the following components:   Troponin I (High Sensitivity) 76 (*)    All other components within normal limits  CULTURE, BLOOD (ROUTINE X 2)  CULTURE, BLOOD (ROUTINE X 2)  RESP PANEL BY RT-PCR (FLU A&B, COVID) ARPGX2  LACTIC ACID, PLASMA  LACTIC ACID, PLASMA  MAGNESIUM  URINALYSIS, ROUTINE W REFLEX MICROSCOPIC  BLOOD GAS, VENOUS  POC OCCULT BLOOD, ED    EKG None  Radiology CT CHEST ABDOMEN PELVIS WO CONTRAST  Result Date: 03/11/2022 CLINICAL DATA:  Altered mental status. While the reported history is newly diagnosed pancreatic cancer, when correlating with recent PET-CT, the patient has metastatic distal esophageal cancer. EXAM: CT CHEST, ABDOMEN AND PELVIS WITHOUT CONTRAST TECHNIQUE: Multidetector CT imaging of the chest, abdomen and pelvis was performed following the  standard protocol without IV contrast. RADIATION DOSE REDUCTION: This exam was performed according to the departmental dose-optimization program which includes automated exposure control, adjustment of the mA and/or kV according to patient size and/or use of iterative reconstruction technique. COMPARISON:  PET-CT dated 03/08/2022. CTA chest dated 02/27/2022. MRI abdomen dated 02/11/2022. CT abdomen/pelvis dated 02/11/2022. FINDINGS: CT CHEST FINDINGS Cardiovascular: The heart is normal in size. Trace pericardial fluid. No evidence thoracic aortic aneurysm. Atherosclerotic calcifications of the arch. Three vessel coronary atherosclerosis. Right chest power port terminates at the cavoatrial junction. Mediastinum/Nodes: Known distal esophageal mass is not well visualized on unenhanced CT. Small mediastinal nodes, including a 9 mm short axis lower esophageal node (series 13/image 165) and 8 mm short axis low right paratracheal node (series 13/image 36). Visualized thyroid is unremarkable. Lungs/Pleura: Mild centrilobular emphysematous changes. Mild linear scarring/atelectasis in the right lower lobe. No focal consolidation. No suspicious pulmonary nodules, noting motion degradation. No pleural effusion or pneumothorax. Musculoskeletal: Healing anterior 2nd through 7th rib fractures bilaterally. Healing sternal fracture. Thoracic spine is within normal limits. CT ABDOMEN PELVIS FINDINGS Hepatobiliary: Large central hepatic mass measuring 8.3 cm, poorly evaluated on the current study, better evaluated on prior CT. Gallbladder is unremarkable. No intrahepatic or extrahepatic ductal dilatation. Pancreas: Parenchymal calcifications in the head/uncinate process, reflecting sequela chronic pancreatitis. Trace peripancreatic fluid along the pancreatic tail (series 13/image 209), reflecting acute inflammatory change. 4.3 cm fluid density lesion in the pancreatic head (series 13/image 221), non FDG avid on recent PET-CT, likely  reflecting a pseudocyst. Spleen: Within normal limits. Adrenals/Urinary Tract: Adrenal glands are within normal limits. Kidneys are within normal limits.  No hydronephrosis. Bladder is within normal limits. Stomach/Bowel: Stomach is within normal  limits. No evidence of bowel obstruction. Normal appendix (series 13/image 278). Mild sigmoid diverticulosis, without evidence of diverticulitis. Vascular/Lymphatic: No evidence of abdominal aortic aneurysm. Atherosclerotic calcifications of the abdominal aorta and branch vessels. Upper abdominal lymphadenopathy, including a dominant 2.7 cm short axis gastrohepatic node (series 13/image 193). Additional small calcified para-aortic nodes, possibly reactive. Reproductive: Prostate is unremarkable. Other: Mild presacral stranding.  No abdominopelvic ascites. Musculoskeletal: Mild degenerative changes of the lumbar spine. Status post PLIF at L5-S1. IMPRESSION: No interval change from recent PET-CT.  No acute findings. Known distal esophageal mass (corresponding to the patient's primary cancer) is not well visualized on unenhanced CT. Large hepatic metastasis.  Upper abdominal nodal metastases. Acute on chronic pancreatitis with 4.3 cm pseudocyst in the pancreatic head. Additional stable ancillary findings as above. Aortic Atherosclerosis (ICD10-I70.0) and Emphysema (ICD10-J43.9). Electronically Signed   By: Julian Hy M.D.   On: 03/11/2022 21:44   CT Head Wo Contrast  Result Date: 03/11/2022 CLINICAL DATA:  Altered mental status EXAM: CT HEAD WITHOUT CONTRAST TECHNIQUE: Contiguous axial images were obtained from the base of the skull through the vertex without intravenous contrast. RADIATION DOSE REDUCTION: This exam was performed according to the departmental dose-optimization program which includes automated exposure control, adjustment of the mA and/or kV according to patient size and/or use of iterative reconstruction technique. COMPARISON:  01/31/2022 FINDINGS:  Brain: Old left PCA territory infarct and multiple old right cerebellar infarcts. No acute intracranial hemorrhage. No midline shift or other mass effect. Vascular: No abnormal hyperdensity of the major intracranial arteries or dural venous sinuses. No intracranial atherosclerosis. Skull: The visualized skull base, calvarium and extracranial soft tissues are normal. Sinuses/Orbits: No fluid levels or advanced mucosal thickening of the visualized paranasal sinuses. No mastoid or middle ear effusion. The orbits are normal. IMPRESSION: 1. No acute intracranial abnormality. 2. 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 Electronically Signed   By: Ulyses Jarred M.D.   On: 03/11/2022 21:39   DG Chest Port 1 View  Result Date: 03/11/2022 CLINICAL DATA:  Questionable sepsis. EXAM: PORTABLE CHEST 1 VIEW COMPARISON:  Chest x-ray 02/27/2022 FINDINGS: Right chest port catheter tip projects over the SVC. This is new from prior. The heart is enlarged, unchanged. There is no focal lung infiltrate, pleural effusion or pneumothorax. There are atherosclerotic calcifications of the aorta. No acute fractures are seen. IMPRESSION: 1. New right-sided chest port with distal catheter tip projecting over the SVC. 2. Stable cardiomegaly. 3. The lungs are clear. Electronically Signed   By: Ronney Asters M.D.   On: 03/11/2022 19:59    Procedures Procedures    Medications Ordered in ED Medications  lactated ringers infusion ( Intravenous New Bag/Given 03/11/22 2130)  potassium chloride 10 mEq in 100 mL IVPB (has no administration in time range)  lactated ringers bolus 1,000 mL (1,000 mLs Intravenous New Bag/Given 03/11/22 1946)  ceFEPIme (MAXIPIME) 2 g in sodium chloride 0.9 % 100 mL IVPB (2 g Intravenous New Bag/Given 03/11/22 2108)  metroNIDAZOLE (FLAGYL) IVPB 500 mg (500 mg Intravenous New Bag/Given 03/11/22 2135)  fentaNYL (SUBLIMAZE) injection 25 mcg (25 mcg Intravenous Given 03/11/22 2106)   vancomycin (VANCOREADY) IVPB 1500 mg/300 mL (1,500 mg Intravenous New Bag/Given 03/11/22 2133)    ED Course/ Medical Decision Making/ A&P                           Medical Decision Making Amount and/or Complexity of Data Reviewed Labs: ordered.  Radiology: ordered. ECG/medicine tests: ordered.  Risk Prescription drug management. Decision regarding hospitalization.   This patient presents to the ED for concern of generalized weakness, this involves an extensive number of treatment options, and is a complaint that carries with it a high risk of complications and morbidity.  The differential diagnosis includes sepsis, viral illness, cholangitis, pyonephritis, worsening of malignancy, dehydration, metabolic derangements   Co morbidities that complicate the patient evaluation  HLD, HTN, tobacco use, CAD, PAD, left AKA, cardiac arrest, PE, DVT, CVA.   Additional history obtained:  Additional history obtained from N/A External records from outside source obtained and reviewed including EMR   Lab Tests:  I Ordered, and personally interpreted labs.  The pertinent results include: Baseline normal creatinine with mild hypokalemia and otherwise normal electrolytes.  Mild elevation in troponin, lipase, and INR.  Baseline elevation in alkaline phosphatase with normal transaminases.  He has a 2 g drop in hemoglobin.  No leukocytosis is present.   Imaging Studies ordered:  I ordered imaging studies including CT imaging of head, chest, abdomen, and pelvis I independently visualized and interpreted imaging which showed no acute findings on CT head.  On CT of abdomen and pelvis, there is redemonstration of esophageal, hepatic, and pancreatic masses.  These were seen on PET scan 3 days ago.  At that time, esophageal and hepatic masses were hypermetabolic.  Pancreatic head lesion favored to be benign. I agree with the radiologist interpretation   Cardiac Monitoring: / EKG:  The patient was  maintained on a cardiac monitor.  I personally viewed and interpreted the cardiac monitored which showed an underlying rhythm of: Sinus rhythm   Problem List / ED Course / Critical interventions / Medication management  Patient presents from home by EMS for his remains concern of generalized weakness and altered mental status.  EMS noted febrile temperature, tachycardia, and tachypnea.  He was given 650 of Tylenol prior to arrival in addition to 1 L of IV fluid.  On arrival, patient is normothermic.  On assessment, patient is alert and oriented.  He denies any new areas of discomfort.  He denies any recent infectious symptoms and does not recall feeling feverish prior to arrival.  He is found to be diaphoretic which I suspect is fever defervesce.  He remained mildly tachycardic with low-normal blood pressure.  Septic work-up was initiated.  Patient was given additional IV fluids.  On reassessment, patient is more fatigued.  At this point, he has his caregiver/remained at bedside.  Caregiver reports that he did start radiation therapy 2 days ago.  He has not yet started chemotherapy.  Since his radiation session, he has had increased fatigue and malaise.  He has had recent vomiting.  Following a second liter of IVF, patient's heart rate has improved, however, he is now hypotensive.  Levophed was ordered.  Patient to be covered with broad-spectrum antibiotics for empiric treatment of sepsis.  He has been complaining of back pain, however, no significant tenderness on exam.  On reassessment, patient is somnolent but arousable.  Blood pressures remained soft.  As needed Levophed was ordered but patient did not require this while in the ED.  CT imaging showed redemonstration of known esophageal, hepatic, and pancreatic masses that were identified on PET scan 3 days ago.  CT of head showed no acute findings.  Patient was unable to urinate.  On bedside ultrasound, he does have 500 cc of urine in his bladder.  This is  consistent with urinary retention.  In and out catheter was ordered.  He did request pain medication and low-dose fentanyl was ordered given his somnolence while in the ED. blood gas is pending at time of admission.  In and out catheter for urine sample is pending at time of admission.  Given the patient's ongoing generalized weakness and intermittent somnolence while in the ED, patient was admitted to hospitalist for further management. I ordered medication including IV fluid and broad-spectrum antibiotics for empiric treatment of sepsis; potassium chloride for hypokalemia. Reevaluation of the patient after these medicines showed that the patient improved I have reviewed the patients home medicines and have made adjustments as needed   Social Determinants of Health:  Lives at home with caregiver          Final Clinical Impression(s) / ED Diagnoses Final diagnoses:  Fever in adult  Generalized weakness    Rx / DC Orders ED Discharge Orders     None         Godfrey Pick, MD 03/11/22 2338

## 2022-03-12 DIAGNOSIS — R6510 Systemic inflammatory response syndrome (SIRS) of non-infectious origin without acute organ dysfunction: Secondary | ICD-10-CM | POA: Diagnosis not present

## 2022-03-12 LAB — BLOOD GAS, VENOUS
Acid-Base Excess: 0.8 mmol/L (ref 0.0–2.0)
Bicarbonate: 27.1 mmol/L (ref 20.0–28.0)
O2 Saturation: 49.3 %
Patient temperature: 37
pCO2, Ven: 49 mmHg (ref 44–60)
pH, Ven: 7.35 (ref 7.25–7.43)
pO2, Ven: 37 mmHg (ref 32–45)

## 2022-03-12 LAB — BLOOD CULTURE ID PANEL (REFLEXED) - BCID2

## 2022-03-12 LAB — MAGNESIUM: Magnesium: 1.8 mg/dL (ref 1.7–2.4)

## 2022-03-12 LAB — URINALYSIS, ROUTINE W REFLEX MICROSCOPIC
Bilirubin Urine: NEGATIVE
Glucose, UA: NEGATIVE mg/dL
Hgb urine dipstick: NEGATIVE
Ketones, ur: NEGATIVE mg/dL
Leukocytes,Ua: NEGATIVE
Nitrite: NEGATIVE
Protein, ur: NEGATIVE mg/dL
Specific Gravity, Urine: 1.008 (ref 1.005–1.030)
pH: 6 (ref 5.0–8.0)

## 2022-03-12 LAB — CBC
HCT: 27.3 % — ABNORMAL LOW (ref 39.0–52.0)
Hemoglobin: 8.3 g/dL — ABNORMAL LOW (ref 13.0–17.0)
MCH: 26.3 pg (ref 26.0–34.0)
MCHC: 30.4 g/dL (ref 30.0–36.0)
MCV: 86.4 fL (ref 80.0–100.0)
Platelets: 129 10*3/uL — ABNORMAL LOW (ref 150–400)
RBC: 3.16 MIL/uL — ABNORMAL LOW (ref 4.22–5.81)
RDW: 17 % — ABNORMAL HIGH (ref 11.5–15.5)
WBC: 4.6 10*3/uL (ref 4.0–10.5)
nRBC: 0 % (ref 0.0–0.2)

## 2022-03-12 LAB — COMPREHENSIVE METABOLIC PANEL
ALT: 27 U/L (ref 0–44)
AST: 30 U/L (ref 15–41)
Albumin: 2.5 g/dL — ABNORMAL LOW (ref 3.5–5.0)
Alkaline Phosphatase: 203 U/L — ABNORMAL HIGH (ref 38–126)
Anion gap: 5 (ref 5–15)
BUN: 15 mg/dL (ref 8–23)
CO2: 25 mmol/L (ref 22–32)
Calcium: 8.4 mg/dL — ABNORMAL LOW (ref 8.9–10.3)
Chloride: 110 mmol/L (ref 98–111)
Creatinine, Ser: 0.95 mg/dL (ref 0.61–1.24)
GFR, Estimated: >60 mL/min (ref >60)
Glucose, Bld: 125 mg/dL — ABNORMAL HIGH (ref 70–99)
Potassium: 3.8 mmol/L (ref 3.5–5.1)
Sodium: 140 mmol/L (ref 135–145)
Total Bilirubin: 0.6 mg/dL (ref 0.3–1.2)
Total Protein: 6.6 g/dL (ref 6.5–8.1)

## 2022-03-12 LAB — RESP PANEL BY RT-PCR (FLU A&B, COVID) ARPGX2
Influenza A by PCR: NEGATIVE
Influenza B by PCR: NEGATIVE
SARS Coronavirus 2 by RT PCR: NEGATIVE

## 2022-03-12 LAB — PROCALCITONIN: Procalcitonin: 1.03 ng/mL

## 2022-03-12 MED ORDER — MAGNESIUM OXIDE -MG SUPPLEMENT 400 (240 MG) MG PO TABS
400.0000 mg | ORAL_TABLET | Freq: Once | ORAL | Status: AC
  Administered 2022-03-12: 400 mg via ORAL
  Filled 2022-03-12: qty 1

## 2022-03-12 MED ORDER — GABAPENTIN 100 MG PO CAPS
100.0000 mg | ORAL_CAPSULE | Freq: Every evening | ORAL | Status: DC | PRN
  Administered 2022-03-12 – 2022-03-17 (×5): 100 mg via ORAL
  Filled 2022-03-12 (×5): qty 1

## 2022-03-12 MED ORDER — VANCOMYCIN HCL 750 MG/150ML IV SOLN
750.0000 mg | Freq: Two times a day (BID) | INTRAVENOUS | Status: DC
  Filled 2022-03-12: qty 150

## 2022-03-12 MED ORDER — ACETAMINOPHEN 650 MG RE SUPP: 650.0000 mg | Freq: Four times a day (QID) | RECTAL | Status: DC | PRN

## 2022-03-12 MED ORDER — SODIUM CHLORIDE 0.9 % IV SOLN
2.0000 g | Freq: Every day | INTRAVENOUS | Status: DC
  Administered 2022-03-12 – 2022-03-14 (×3): 2 g via INTRAVENOUS
  Filled 2022-03-12 (×3): qty 20

## 2022-03-12 MED ORDER — PROCHLORPERAZINE EDISYLATE 10 MG/2ML IJ SOLN
10.0000 mg | Freq: Four times a day (QID) | INTRAMUSCULAR | Status: DC | PRN
  Administered 2022-03-13 – 2022-03-17 (×7): 10 mg via INTRAVENOUS
  Filled 2022-03-12 (×7): qty 2

## 2022-03-12 MED ORDER — SODIUM CHLORIDE 0.9 % IV SOLN
2.0000 g | Freq: Three times a day (TID) | INTRAVENOUS | Status: DC
  Administered 2022-03-12: 2 g via INTRAVENOUS
  Filled 2022-03-12: qty 12.5

## 2022-03-12 MED ORDER — DULOXETINE HCL 30 MG PO CPEP
30.0000 mg | ORAL_CAPSULE | Freq: Every day | ORAL | Status: DC
  Administered 2022-03-12 – 2022-03-18 (×7): 30 mg via ORAL
  Filled 2022-03-12 (×7): qty 1

## 2022-03-12 MED ORDER — ATORVASTATIN CALCIUM 40 MG PO TABS
80.0000 mg | ORAL_TABLET | Freq: Every day | ORAL | Status: DC
  Administered 2022-03-12 – 2022-03-17 (×6): 80 mg via ORAL
  Filled 2022-03-12 (×6): qty 2

## 2022-03-12 MED ORDER — APIXABAN 5 MG PO TABS
5.0000 mg | ORAL_TABLET | Freq: Two times a day (BID) | ORAL | Status: DC
  Administered 2022-03-12 (×2): 5 mg via ORAL
  Filled 2022-03-12 (×2): qty 1

## 2022-03-12 MED ORDER — OXYCODONE-ACETAMINOPHEN 10-325 MG PO TABS: 1.0000 | ORAL_TABLET | Freq: Four times a day (QID) | ORAL | Status: DC | PRN

## 2022-03-12 MED ORDER — OXYCODONE HCL 5 MG PO TABS
5.0000 mg | ORAL_TABLET | Freq: Four times a day (QID) | ORAL | Status: DC | PRN
  Administered 2022-03-12 – 2022-03-13 (×3): 5 mg via ORAL
  Filled 2022-03-12 (×3): qty 1

## 2022-03-12 MED ORDER — METRONIDAZOLE 500 MG/100ML IV SOLN
500.0000 mg | Freq: Two times a day (BID) | INTRAVENOUS | Status: DC
  Filled 2022-03-12: qty 100

## 2022-03-12 MED ORDER — ACETAMINOPHEN 325 MG PO TABS
650.0000 mg | ORAL_TABLET | Freq: Four times a day (QID) | ORAL | Status: DC | PRN
  Administered 2022-03-12 – 2022-03-18 (×5): 650 mg via ORAL
  Filled 2022-03-12 (×5): qty 2

## 2022-03-12 MED ORDER — CLOPIDOGREL BISULFATE 75 MG PO TABS
75.0000 mg | ORAL_TABLET | Freq: Every day | ORAL | Status: DC
  Administered 2022-03-12: 75 mg via ORAL
  Filled 2022-03-12: qty 1

## 2022-03-12 MED ORDER — SODIUM CHLORIDE 0.9% FLUSH
3.0000 mL | Freq: Two times a day (BID) | INTRAVENOUS | Status: DC
  Administered 2022-03-12 – 2022-03-16 (×10): 3 mL via INTRAVENOUS

## 2022-03-12 MED ORDER — SENNOSIDES-DOCUSATE SODIUM 8.6-50 MG PO TABS: 1.0000 | ORAL_TABLET | Freq: Every evening | ORAL | Status: DC | PRN

## 2022-03-12 MED ORDER — OXYCODONE-ACETAMINOPHEN 5-325 MG PO TABS
1.0000 | ORAL_TABLET | Freq: Four times a day (QID) | ORAL | Status: DC | PRN
  Administered 2022-03-12: 1 via ORAL
  Filled 2022-03-12: qty 1

## 2022-03-12 MED ORDER — OXYCODONE-ACETAMINOPHEN 5-325 MG PO TABS
1.0000 | ORAL_TABLET | Freq: Four times a day (QID) | ORAL | Status: DC | PRN
  Administered 2022-03-12 – 2022-03-13 (×4): 1 via ORAL
  Filled 2022-03-12 (×4): qty 1

## 2022-03-12 NOTE — Progress Notes (Signed)
PROGRESS NOTE   [name] [name]  JFH:[id number] DOB: 08/17/1960 DOA: 03/11/2022 PCP: [name] Blonder, DO  Brief Narrative:  61 year old home dwelling) Known rheumatoid arthritis PAD status post left AKA 2021+ prior endarterectomies and extensive vascular surgery back in 2021 Inferior STEMI 01/2022 status post cath  Recent diagnosis of pancreatic mass 02/11/2022 concerning for possible metastatic esophageal CA in addition to exudative started on the chart Depression/anxiety  Brought in because acting altered cording to her roommate-quite somnolent-no urination over the whole past 24 hours prior to admission Tmax 103.3 Briefly hypotensive 90s over 50s WBC 5.3 hemoglobin 8.5 platelet 168 potassium 3.2 BUNs/creatinine 16/1.0 Bladder scan showed 500 cc in and out cath was performed  CT head old infarcts CT chest abdomen pelvis showed no new changes other than known large hepatic metastases and upper abdominal nodal mets with a 4.3 pseudocyst at the pancreatic head   Hospital-Problem based course  Possible sepsis on admission?  Urinary source In And out cath showed no bacteria no leukocytes or no leukocyte esterase nitrates therefore not really sure if there was a source This could be from his underlying malignancy which is yet to be treated We will continue IV antibiotics the only cefepime discontinue Vanco and Flagyl today if culture is negative will de-escalate to complete chest a total of 3 days antibiotics Continue LR 125 cc/H  Urinary retention Resolved after Foley in and out-try to avoid recatheterization  ?  Metastatic esophageal cancer/pancreatic or upper GI Further determination as per Dr. Benay Spice I will add him to the treatment team Looks like he has an outpatient appointment 9/20  Prior fractured sternum and radiculopathy with prior rib fractures Continue oxycodone at a dose of 10 every 6 as needed continue gabapentin 100 a chest pain,  Watch mentation  Prior pulmonary  embolism/DVT Recurrent on 02/27/2022 Unclear if patient is compliant on meds-discharged on apixaban 5 twice daily  CABG status post stent 2020 Continue Plavix 75 daily, Lipitor 80 daily Noncompliant on Toprol-XL  PAD with stenting status post AKA left side 04/2020 Outpatient follow-up   DVT prophylaxis: Apixaban Code Status: Full Family Communication: None present Disposition:  Status is: Inpatient Remains inpatient appropriate because:   Needs further work-up   Consultants:  None  Procedures: No  Antimicrobials: Cefepime   Subjective: Coherent lucid knows where he is knows date time year Did feel a fever yesterday but unclear what the etiology was No chest pain Passing good urine now  Objective: Vitals:   03/12/22 0122 03/12/22 0215 03/12/22 0318 03/12/22 0500  BP:  114/76    Pulse:  91    Resp:  19    Temp: 97.7 F (36.5 C) 98 F (36.7 C)    TempSrc:  Oral    SpO2:  97%    Weight:   71.3 kg 71.8 kg  Height:   '5\' 7"'$  (1.702 m)     Intake/Output Summary (Last 24 hours) at 03/12/2022 0753 Last data filed at 03/12/2022 0602 Gross per 24 hour  Intake 3285.5 ml  Output 250 ml  Net 3035.5 ml   Filed Weights   03/11/22 1853 03/12/22 0318 03/12/22 0500  Weight: 77.1 kg 71.3 kg 71.8 kg    Examination: EOMI NCAT no focal deficit no icterus or pallor Chest is clear no rales rhonchi wheeze Abdomen soft Neurologically intact moving all 4 limbs equally no left AKA Psych coherent Skin not swollen no edema    Data Reviewed: personally reviewed   CBC    Component Value  Date/Time   WBC 4.6 03/12/2022 0349   RBC 3.16 (L) 03/12/2022 0349   HGB 8.3 (L) 03/12/2022 0349   HGB 11.3 (L) 03/03/2022 0930   HGB 13.9 03/13/2017 1032   HCT 27.3 (L) 03/12/2022 0349   HCT 41.6 03/13/2017 1032   PLT 129 (L) 03/12/2022 0349   PLT 212 03/03/2022 0930   PLT 204 03/13/2017 1032   MCV 86.4 03/12/2022 0349   MCV 84 03/13/2017 1032   MCH 26.3 03/12/2022 0349   MCHC 30.4  03/12/2022 0349   RDW 17.0 (H) 03/12/2022 0349   RDW 13.7 03/13/2017 1032   LYMPHSABS 0.7 03/11/2022 1930   LYMPHSABS 1.8 03/13/2017 1032   MONOABS 0.4 03/11/2022 1930   EOSABS 0.0 03/11/2022 1930   EOSABS 0.2 03/13/2017 1032   BASOSABS 0.0 03/11/2022 1930   BASOSABS 0.0 03/13/2017 1032      Latest Ref Rng & Units 03/12/2022    3:49 AM 03/11/2022    9:20 PM 03/11/2022    7:30 PM  CMP  Glucose 70 - 99 mg/dL 125  99  121   BUN 8 - 23 mg/dL '15  14  16   '$ Creatinine 0.61 - 1.24 mg/dL 0.95  1.00  1.02   Sodium 135 - 145 mmol/L 140  138  136   Potassium 3.5 - 5.1 mmol/L 3.8  3.4  3.2   Chloride 98 - 111 mmol/L 110  103  105   CO2 22 - 32 mmol/L 25   24   Calcium 8.9 - 10.3 mg/dL 8.4   8.3   Total Protein 6.5 - 8.1 g/dL 6.6   7.3   Total Bilirubin 0.3 - 1.2 mg/dL 0.6   0.6   Alkaline Phos 38 - 126 U/L 203   241   AST 15 - 41 U/L 30   36   ALT 0 - 44 U/L 27   29      Radiology Studies: CT CHEST ABDOMEN PELVIS WO CONTRAST  Result Date: 03/11/2022 CLINICAL DATA:  Altered mental status. While the reported history is newly diagnosed pancreatic cancer, when correlating with recent PET-CT, the patient has metastatic distal esophageal cancer. EXAM: CT CHEST, ABDOMEN AND PELVIS WITHOUT CONTRAST TECHNIQUE: Multidetector CT imaging of the chest, abdomen and pelvis was performed following the standard protocol without IV contrast. RADIATION DOSE REDUCTION: This exam was performed according to the departmental dose-optimization program which includes automated exposure control, adjustment of the mA and/or kV according to patient size and/or use of iterative reconstruction technique. COMPARISON:  PET-CT dated 03/08/2022. CTA chest dated 02/27/2022. MRI abdomen dated 02/11/2022. CT abdomen/pelvis dated 02/11/2022. FINDINGS: CT CHEST FINDINGS Cardiovascular: The heart is normal in size. Trace pericardial fluid. No evidence thoracic aortic aneurysm. Atherosclerotic calcifications of the arch. Three vessel  coronary atherosclerosis. Right chest power port terminates at the cavoatrial junction. Mediastinum/Nodes: Known distal esophageal mass is not well visualized on unenhanced CT. Small mediastinal nodes, including a 9 mm short axis lower esophageal node (series 13/image 165) and 8 mm short axis low right paratracheal node (series 13/image 36). Visualized thyroid is unremarkable. Lungs/Pleura: Mild centrilobular emphysematous changes. Mild linear scarring/atelectasis in the right lower lobe. No focal consolidation. No suspicious pulmonary nodules, noting motion degradation. No pleural effusion or pneumothorax. Musculoskeletal: Healing anterior 2nd through 7th rib fractures bilaterally. Healing sternal fracture. Thoracic spine is within normal limits. CT ABDOMEN PELVIS FINDINGS Hepatobiliary: Large central hepatic mass measuring 8.3 cm, poorly evaluated on the current study, better evaluated on prior CT. Gallbladder  is unremarkable. No intrahepatic or extrahepatic ductal dilatation. Pancreas: Parenchymal calcifications in the head/uncinate process, reflecting sequela chronic pancreatitis. Trace peripancreatic fluid along the pancreatic tail (series 13/image 209), reflecting acute inflammatory change. 4.3 cm fluid density lesion in the pancreatic head (series 13/image 221), non FDG avid on recent PET-CT, likely reflecting a pseudocyst. Spleen: Within normal limits. Adrenals/Urinary Tract: Adrenal glands are within normal limits. Kidneys are within normal limits.  No hydronephrosis. Bladder is within normal limits. Stomach/Bowel: Stomach is within normal limits. No evidence of bowel obstruction. Normal appendix (series 13/image 278). Mild sigmoid diverticulosis, without evidence of diverticulitis. Vascular/Lymphatic: No evidence of abdominal aortic aneurysm. Atherosclerotic calcifications of the abdominal aorta and branch vessels. Upper abdominal lymphadenopathy, including a dominant 2.7 cm short axis gastrohepatic node  (series 13/image 193). Additional small calcified para-aortic nodes, possibly reactive. Reproductive: Prostate is unremarkable. Other: Mild presacral stranding.  No abdominopelvic ascites. Musculoskeletal: Mild degenerative changes of the lumbar spine. Status post PLIF at L5-S1. IMPRESSION: No interval change from recent PET-CT.  No acute findings. Known distal esophageal mass (corresponding to the patient's primary cancer) is not well visualized on unenhanced CT. Large hepatic metastasis.  Upper abdominal nodal metastases. Acute on chronic pancreatitis with 4.3 cm pseudocyst in the pancreatic head. Additional stable ancillary findings as above. Aortic Atherosclerosis (ICD10-I70.0) and Emphysema (ICD10-J43.9). Electronically Signed   By: Julian Hy M.D.   On: 03/11/2022 21:44   CT Head Wo Contrast  Result Date: 03/11/2022 CLINICAL DATA:  Altered mental status EXAM: CT HEAD WITHOUT CONTRAST TECHNIQUE: Contiguous axial images were obtained from the base of the skull through the vertex without intravenous contrast. RADIATION DOSE REDUCTION: This exam was performed according to the departmental dose-optimization program which includes automated exposure control, adjustment of the mA and/or kV according to patient size and/or use of iterative reconstruction technique. 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 No acute intracranial hemorrhage. No midline shift or other mass effect. Vascular: No abnormal hyperdensity of the major intracranial arteries or dural venous sinuses. No intracranial atherosclerosis. Skull: The visualized skull base, calvarium and extracranial soft tissues are normal. Sinuses/Orbits: No fluid levels or advanced mucosal thickening of the visualized paranasal sinuses. No mastoid or middle ear effusion. The orbits are normal. IMPRESSION: 1. No acute intracranial abnormality. 2. Old left PCA territory infarct and  multiple old right cerebellar infarcts. Electronically Signed   By: Ulyses Jarred M.D.   On: 03/11/2022 21:39   DG Chest Port 1 View  Result Date: 03/11/2022 CLINICAL DATA:  Questionable sepsis. EXAM: PORTABLE CHEST 1 VIEW COMPARISON:  Chest x-ray 02/27/2022 FINDINGS: Right chest port catheter tip projects over the SVC. This is new from prior. The heart is enlarged, unchanged. There is no focal lung infiltrate, pleural effusion or pneumothorax. There are atherosclerotic calcifications of the aorta. No acute fractures are seen. IMPRESSION: 1. New right-sided chest port with distal catheter tip projecting over the SVC. 2. Stable cardiomegaly. 3. The lungs are clear. Electronically Signed   By: Ronney Asters M.D.   On: 03/11/2022 19:59     Scheduled Meds:  apixaban  5 mg Oral BID   atorvastatin  80 mg Oral q1800   clopidogrel  75 mg Oral Daily   DULoxetine  30 mg Oral Daily   sodium chloride flush  3 mL Intravenous Q12H   Continuous Infusions:  ceFEPime (MAXIPIME) IV 2 g (03/12/22 0602)   lactated ringers 125 mL/hr at 03/12/22 0443   metronidazole  vancomycin       LOS: 1 day   Time spent: [place], MD [facility] To contact the attending provider between 7A-7P or the covering provider during after hours 7P-7A, please log into the web site [email or web address] and access using universal [facility] password for that web site. If you do not have the password, please call the hospital operator.  03/12/2022, 7:53 AM

## 2022-03-12 NOTE — Assessment & Plan Note (Signed)
500 cc urine in bladder on arrival.  In and out cath pending.  Bladder scan as needed and if requiring frequent in and out cath then place Foley.

## 2022-03-12 NOTE — Assessment & Plan Note (Addendum)
Recent cardiac arrest due to VT and inferior STEMI August 2023.  Cath shows 100% occluded RCA, no intervention performed.  Medical management was recommended.  Patient denies any chest pain.  EKG without acute changes.  Troponin minimally elevated and likely reflecting supply demand ischemia in setting of acute illness. -Continue Plavix and atorvastatin

## 2022-03-12 NOTE — Assessment & Plan Note (Signed)
Presenting with tachypnea, fever, tachycardia, hypotension responsive to fluids.  No leukocytosis.  CT chest/abdomen/pelvis without acute infectious process.  Noted to have urinary retention which may reflect urinary source of infection, in and out cath pending. -Continue empiric antibiotics with vancomycin, cefepime, Flagyl; narrow as able -Follow blood cultures, urinalysis/urine culture -Continue IV fluid hydration overnight

## 2022-03-12 NOTE — Assessment & Plan Note (Signed)
S/p left AKA.  Continue Eliquis, Plavix, statin.

## 2022-03-12 NOTE — Hospital Course (Signed)
[name] [name] is a 61 y.o. 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

## 2022-03-12 NOTE — Assessment & Plan Note (Signed)
Hemoglobin decreased to 8.5 compared to previous 11.3 on 03/03/2022. He is on Eliquis and Plavix. Patient denies any obvious bleeding. -Continue to monitor

## 2022-03-12 NOTE — Assessment & Plan Note (Signed)
Suspected metastatic adenocarcinoma with upper GI primary source with known esophageal mass, hepatic metastasis, metastatic lymphadenopathy.  Follows with oncology, Dr. Benay Spice, with plans to start treatment with FOLFOX on 03/15/2022.

## 2022-03-12 NOTE — Assessment & Plan Note (Signed)
Confusion and excessive somnolence prior to arrival.  Improving with initial IV fluid hydration and antibiotics.

## 2022-03-12 NOTE — Assessment & Plan Note (Signed)
IV supplement given.

## 2022-03-12 NOTE — Progress Notes (Signed)
Pharmacy Antibiotic Note  [name] [name] is a 61 y.o. male admitted on 03/11/2022 with SIRS, unclear source (possibly urinary source per MD assessment). Pharmacy has been consulted for Vancomycin and Cefepime dosing.  Plan: Vancomycin '1500mg'$  IV x 1 given in the ED. Continue with Vancomycin '750mg'$  IV q12h Vancomycin levels at steady state, as indicated Cefepime 2g IV q8h Metronidazole per MD Monitor renal function, cultures, clinical course  Height: '5\' 7"'$  (170.2 cm) Weight: 77.1 kg (170 lb) IBW/kg (Calculated) : 66.1  Temp (24hrs), Avg:98 F (36.7 C), Min:97.7 F (36.5 C), Max:98.4 F (36.9 C)  Recent Labs  Lab 03/11/22 1930 03/11/22 2113 03/11/22 2120  WBC 5.3  --   --   CREATININE 1.02  --  1.00  LATICACIDVEN 1.5 1.6  --     Estimated Creatinine Clearance: 72.5 mL/min (by C-G formula based on SCr of 1 mg/dL).    Allergies  Allergen Reactions   Dilaudid [Hydromorphone Hcl] Rash   Ramipril Rash and Other (See Comments)    Per patient on R arm and leg only; no angioedema   Tramadol Itching and Rash    Antimicrobials this admission: 9/16 Vancomycin >> 9/16 Cefepime >> 9/16 Metronidazole >>  Dose adjustments this admission: --  Microbiology results: 9/16 BCx:  9/17 UCx:     Thank you for allowing pharmacy to be a part of this patient's care.   Lindell Spar, PharmD, BCPS Clinical Pharmacist  03/12/2022 3:41 AM

## 2022-03-13 DIAGNOSIS — R6510 Systemic inflammatory response syndrome (SIRS) of non-infectious origin without acute organ dysfunction: Secondary | ICD-10-CM | POA: Diagnosis not present

## 2022-03-13 LAB — URINE CULTURE: Culture: NO GROWTH

## 2022-03-13 LAB — CBC WITH DIFFERENTIAL/PLATELET
Abs Immature Granulocytes: 0.01 10*3/uL (ref 0.00–0.07)
Basophils Absolute: 0 10*3/uL (ref 0.0–0.1)
Basophils Relative: 1 %
Eosinophils Absolute: 0.5 10*3/uL (ref 0.0–0.5)
Eosinophils Relative: 9 %
HCT: 27.7 % — ABNORMAL LOW (ref 39.0–52.0)
Hemoglobin: 8.4 g/dL — ABNORMAL LOW (ref 13.0–17.0)
Immature Granulocytes: 0 %
Lymphocytes Relative: 18 %
Lymphs Abs: 1 10*3/uL (ref 0.7–4.0)
MCH: 25.8 pg — ABNORMAL LOW (ref 26.0–34.0)
MCHC: 30.3 g/dL (ref 30.0–36.0)
MCV: 85.2 fL (ref 80.0–100.0)
Monocytes Absolute: 0.5 10*3/uL (ref 0.1–1.0)
Monocytes Relative: 9 %
Neutro Abs: 3.5 10*3/uL (ref 1.7–7.7)
Neutrophils Relative %: 63 %
Platelets: 141 10*3/uL — ABNORMAL LOW (ref 150–400)
RBC: 3.25 MIL/uL — ABNORMAL LOW (ref 4.22–5.81)
RDW: 16.9 % — ABNORMAL HIGH (ref 11.5–15.5)
WBC: 5.5 10*3/uL (ref 4.0–10.5)
nRBC: 0 % (ref 0.0–0.2)

## 2022-03-13 LAB — COMPREHENSIVE METABOLIC PANEL
ALT: 25 U/L (ref 0–44)
AST: 28 U/L (ref 15–41)
Albumin: 2.4 g/dL — ABNORMAL LOW (ref 3.5–5.0)
Alkaline Phosphatase: 202 U/L — ABNORMAL HIGH (ref 38–126)
Anion gap: 7 (ref 5–15)
BUN: 12 mg/dL (ref 8–23)
CO2: 24 mmol/L (ref 22–32)
Calcium: 8.6 mg/dL — ABNORMAL LOW (ref 8.9–10.3)
Chloride: 109 mmol/L (ref 98–111)
Creatinine, Ser: 0.92 mg/dL (ref 0.61–1.24)
GFR, Estimated: >60 mL/min (ref >60)
Glucose, Bld: 92 mg/dL (ref 70–99)
Potassium: 3.9 mmol/L (ref 3.5–5.1)
Sodium: 140 mmol/L (ref 135–145)
Total Bilirubin: 0.5 mg/dL (ref 0.3–1.2)
Total Protein: 6.3 g/dL — ABNORMAL LOW (ref 6.5–8.1)

## 2022-03-13 MED ORDER — METRONIDAZOLE 500 MG PO TABS
500.0000 mg | ORAL_TABLET | Freq: Two times a day (BID) | ORAL | Status: DC
  Administered 2022-03-13 – 2022-03-18 (×11): 500 mg via ORAL
  Filled 2022-03-13 (×11): qty 1

## 2022-03-13 MED ORDER — OXYCODONE HCL 5 MG PO TABS
5.0000 mg | ORAL_TABLET | ORAL | Status: DC | PRN
  Administered 2022-03-13 – 2022-03-18 (×18): 5 mg via ORAL
  Filled 2022-03-13 (×18): qty 1

## 2022-03-13 MED ORDER — DICLOFENAC EPOLAMINE 1.3 % EX PTCH
1.0000 | MEDICATED_PATCH | Freq: Two times a day (BID) | CUTANEOUS | Status: DC
  Administered 2022-03-13 – 2022-03-18 (×11): 1 via TRANSDERMAL
  Filled 2022-03-13 (×12): qty 1

## 2022-03-13 MED ORDER — ENOXAPARIN SODIUM 80 MG/0.8ML IJ SOSY
1.0000 mg/kg | PREFILLED_SYRINGE | Freq: Two times a day (BID) | INTRAMUSCULAR | Status: DC
  Administered 2022-03-13 – 2022-03-15 (×5): 72.5 mg via SUBCUTANEOUS
  Filled 2022-03-13 (×5): qty 0.8

## 2022-03-13 MED ORDER — OXYCODONE-ACETAMINOPHEN 5-325 MG PO TABS
1.0000 | ORAL_TABLET | Freq: Four times a day (QID) | ORAL | Status: DC | PRN
  Administered 2022-03-13 – 2022-03-18 (×16): 1 via ORAL
  Filled 2022-03-13 (×16): qty 1

## 2022-03-13 NOTE — Progress Notes (Signed)
Patient ID: [name] [name], male   DOB: [birth year], 61 y.o.   MRN: [id number] IR team aware of request for liver mass biopsy on patient.  Case reviewed by Dr. Laurence Ferrari.  Patient currently on both Eliquis and Plavix with last doses  yesterday.  Eliquis will need to be held 2 days prior to biopsy and Plavix 5 days prior to biopsy.  TRH informed.

## 2022-03-13 NOTE — Progress Notes (Signed)
IP PROGRESS NOTE  Subjective:   [name] [name] was admitted 03/11/2022 with a high fever.  He reports developing acute onset of "sweats "and not feeling well the same day.  He reports chronic discomfort at the anterior chest and back.  He underwent Port-A-Cath placement 03/07/2022.  He reports mild soreness at the Port-A-Cath site.  [name] [name] underwent a staging PET scan 03/08/2022.  Blood cultures from hospital admission returned positive for gram-negative rods, E. coli.  Objective: Vital signs in last 24 hours: Blood pressure (!) 133/96, pulse 94, temperature 97.9 F (36.6 C), resp. rate 18, height '5\' 7"'$  (1.702 m), weight 158 lb 15.2 oz (72.1 kg), SpO2 98 %.  Intake/Output from previous day: 09/17 0701 - 09/18 0700 In: 700 [P.O.:600; IV Piggyback:100] Out: 1200 [Urine:1200]  Physical Exam:  HEENT: No thrush Lungs: Bilateral expiratory wheeze, no respiratory distress Cardiac: Regular rate and rhythm with an occasional pause Abdomen: No hepatosplenomegaly, mild tenderness in the right subcostal region Extremities: No right leg edema   Portacath/PICC-without erythema, resolving ecchymosis inferior to the Port-A-Cath  Lab Results: Recent Labs    03/12/22 0349 03/13/22 0355  WBC 4.6 5.5  HGB 8.3* 8.4*  HCT 27.3* 27.7*  PLT 129* 141*    BMET Recent Labs    03/12/22 0349 03/13/22 0355  NA 140 140  K 3.8 3.9  CL 110 109  CO2 25 24  GLUCOSE 125* 92  BUN 15 12  CREATININE 0.95 0.92  CALCIUM 8.4* 8.6*    Lab Results  Component Value Date   CEA1 287.0 (H) 02/11/2022   CEA 644.67 (H) 03/03/2022   CAN199 88 (H) 02/11/2022    Studies/Results: CT CHEST ABDOMEN PELVIS WO CONTRAST  Result Date: 03/11/2022 CLINICAL DATA:  Altered mental status. While the reported history is newly diagnosed pancreatic cancer, when correlating with recent PET-CT, the patient has metastatic distal esophageal cancer. EXAM: CT CHEST, ABDOMEN AND PELVIS WITHOUT CONTRAST TECHNIQUE: Multidetector  CT imaging of the chest, abdomen and pelvis was performed following the standard protocol without IV contrast. RADIATION DOSE REDUCTION: This exam was performed according to the departmental dose-optimization program which includes automated exposure control, adjustment of the mA and/or kV according to patient size and/or use of iterative reconstruction technique. COMPARISON:  PET-CT dated 03/08/2022. CTA chest dated 02/27/2022. MRI abdomen dated 02/11/2022. CT abdomen/pelvis dated 02/11/2022. FINDINGS: CT CHEST FINDINGS Cardiovascular: The heart is normal in size. Trace pericardial fluid. No evidence thoracic aortic aneurysm. Atherosclerotic calcifications of the arch. Three vessel coronary atherosclerosis. Right chest power port terminates at the cavoatrial junction. Mediastinum/Nodes: Known distal esophageal mass is not well visualized on unenhanced CT. Small mediastinal nodes, including a 9 mm short axis lower esophageal node (series 13/image 165) and 8 mm short axis low right paratracheal node (series 13/image 36). Visualized thyroid is unremarkable. Lungs/Pleura: Mild centrilobular emphysematous changes. Mild linear scarring/atelectasis in the right lower lobe. No focal consolidation. No suspicious pulmonary nodules, noting motion degradation. No pleural effusion or pneumothorax. Musculoskeletal: Healing anterior 2nd through 7th rib fractures bilaterally. Healing sternal fracture. Thoracic spine is within normal limits. CT ABDOMEN PELVIS FINDINGS Hepatobiliary: Large central hepatic mass measuring 8.3 cm, poorly evaluated on the current study, better evaluated on prior CT. Gallbladder is unremarkable. No intrahepatic or extrahepatic ductal dilatation. Pancreas: Parenchymal calcifications in the head/uncinate process, reflecting sequela chronic pancreatitis. Trace peripancreatic fluid along the pancreatic tail (series 13/image 209), reflecting acute inflammatory change. 4.3 cm fluid density lesion in the  pancreatic head (series 13/image 221), non FDG  avid on recent PET-CT, likely reflecting a pseudocyst. Spleen: Within normal limits. Adrenals/Urinary Tract: Adrenal glands are within normal limits. Kidneys are within normal limits.  No hydronephrosis. Bladder is within normal limits. Stomach/Bowel: Stomach is within normal limits. No evidence of bowel obstruction. Normal appendix (series 13/image 278). Mild sigmoid diverticulosis, without evidence of diverticulitis. Vascular/Lymphatic: No evidence of abdominal aortic aneurysm. Atherosclerotic calcifications of the abdominal aorta and branch vessels. Upper abdominal lymphadenopathy, including a dominant 2.7 cm short axis gastrohepatic node (series 13/image 193). Additional small calcified para-aortic nodes, possibly reactive. Reproductive: Prostate is unremarkable. Other: Mild presacral stranding.  No abdominopelvic ascites. Musculoskeletal: Mild degenerative changes of the lumbar spine. Status post PLIF at L5-S1. IMPRESSION: No interval change from recent PET-CT.  No acute findings. Known distal esophageal mass (corresponding to the patient's primary cancer) is not well visualized on unenhanced CT. Large hepatic metastasis.  Upper abdominal nodal metastases. Acute on chronic pancreatitis with 4.3 cm pseudocyst in the pancreatic head. Additional stable ancillary findings as above. Aortic Atherosclerosis (ICD10-I70.0) and Emphysema (ICD10-J43.9). Electronically Signed   By: Julian Hy M.D.   On: 03/11/2022 21:44   CT Head Wo Contrast  Result Date: 03/11/2022 CLINICAL DATA:  Altered mental status EXAM: CT HEAD WITHOUT CONTRAST TECHNIQUE: Contiguous axial images were obtained from the base of the skull through the vertex without intravenous contrast. RADIATION DOSE REDUCTION: This exam was performed according to the departmental dose-optimization program which includes automated exposure control, adjustment of the mA and/or kV according to patient size and/or  use of iterative reconstruction technique. 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 No acute intracranial hemorrhage. No midline shift or other mass effect. Vascular: No abnormal hyperdensity of the major intracranial arteries or dural venous sinuses. No intracranial atherosclerosis. Skull: The visualized skull base, calvarium and extracranial soft tissues are normal. Sinuses/Orbits: No fluid levels or advanced mucosal thickening of the visualized paranasal sinuses. No mastoid or middle ear effusion. The orbits are normal. IMPRESSION: 1. No acute intracranial abnormality. 2. 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 Electronically Signed   By: Ulyses Jarred M.D.   On: 03/11/2022 21:39   DG Chest Port 1 View  Result Date: 03/11/2022 CLINICAL DATA:  Questionable sepsis. EXAM: PORTABLE CHEST 1 VIEW COMPARISON:  Chest x-ray 02/27/2022 FINDINGS: Right chest port catheter tip projects over the SVC. This is new from prior. The heart is enlarged, unchanged. There is no focal lung infiltrate, pleural effusion or pneumothorax. There are atherosclerotic calcifications of the aorta. No acute fractures are seen. IMPRESSION: 1. New right-sided chest port with distal catheter tip projecting over the SVC. 2. Stable cardiomegaly. 3. The lungs are clear. Electronically Signed   By: Ronney Asters M.D.   On: 03/11/2022 19:59    Medications: I have reviewed the patient's current medications.  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 -02/11/2022 CTA chest-7.8 x 8.5 cm subtle hypoattenuating mass lesion in segment IV of the liver. -02/11/2022 CT abdomen/pelvis-8.7 x 7.8 or ill-defined irregular mass in the medial segment left liver, 4.5 x 3.4 cm necrotic mass lesion  just cranial to the esophagogastric junction with metastatic lymphadenopathy in the abdomen and pelvis, irregular nodular peritoneal thickening in the lower abdomen and pelvis -02/11/2022 MRI abdomen-rim-enhancing likely necrotic mass or lymph node conglomerate centered around the gastroesophageal junction measuring 5.0 x 3.9 cm,  large rim-enhancing likely necrotic liver mass centered in the anterior left lobe of the liver measuring 7.8 x 7.2 cm, enlarged gastrohepatic ligament and retroperitoneal lymph nodes -Labs from 02/11/2022-AFP 2.4, CA 19.9 88, CEA 287 -Ultrasound-guided liver biopsy performed 02/15/2022-adenocarcinoma with extensive necrosis, CK7 positive, negative for CK20, CDX2, TTF-1, and PAX8, consistent with a pancreatic, lung, or upper gastrointestinal primary -PET 03/08/2022-hypermetabolic distal esophagus mass, hypermetabolic centrally necrotic hepatic mass, hypermetabolic periportal nodes, exophytic GE junction mass without hypermetabolism-likely benign lesion, no hypermetabolism in the pancreas head, bilateral hypermetabolic adrenal gland lesions, small hypermetabolic left parotid gland lesion -CTs 03/11/2022-no change from PET 03/08/2022, known distal esophagus mass-not visualized on unenhanced CT, large hepatic metastasis and upper abdominal nodal metastases, chronic pancreatitis with 4.3 cm pseudocyst in the pancreas head 2.  Myocardial infarction/cardiac arrest 01/31/2022 3.  Microcytic anemia -Labs from 02/11/2022-ferritin 151, iron 13, percent saturation 5% 4.  DVT/pulmonary embolism-bilateral lower extremity DVTs 01/10/2022, pulmonary emboli on chest CT 01/31/2022-apixaban 5.  PAD status post AKA in 2021 6.  RA 7.  Bilateral pleural effusions -Ultrasound-guided thoracentesis performed on 02/11/2022.  Cytology negative for malignancy. 8.  COPD 9.  Admission 03/11/2022 with E. coli bacteremia  [name] [name] has been diagnosed with metastatic adenocarcinoma involving a liver mass.  The staging  PET scan 03/08/2022 reveals a hypermetabolic distal esophagus mass.  The clinical presentation and liver biopsy are consistent with a diagnosis of metastatic esophagus cancer.  I discussed the case with gastroenterology 2 weeks ago.  They do not recommend an upper endoscopy due to the recent cardiac arrest/myocardial infarction.  I discussed the PET findings with [name] [name].  I reviewed the PET images.  We are waiting on results of molecular testing on the liver biopsy tissue.  My recommendation is to proceed with systemic therapy, likely FOLFOX/nivolumab when he has recovered from the E. coli bacteremia.  He is now admitted with E. coli bacteremia.  The source for infection is unclear, but I suspect the bacteremia is related to the necrotic liver mass.  Could he had developed a liver abscess?  He underwent Port-A-Cath insertion on 03/07/2022.  It would be unusual to develop a Port-A-Cath infection this soon and there is no clinical evidence of a Port-A-Cath infection.  Recommendations: Continue IV antibiotics, follow-up sensitivities on the E. Coli Consider IR consultation to evaluate the dominant liver mass, abscess? Okay to access and use the Port-A-Cath  Outpatient follow-up will be scheduled at the Cancer center to begin systemic therapy for treatment of metastatic esophagus cancer.       LOS: 2 days   Betsy Coder, MD   03/13/2022, 6:38 AM

## 2022-03-13 NOTE — Progress Notes (Signed)
ANTICOAGULATION CONSULT NOTE - Initial Consult  Pharmacy Consult for enoxaparin Indication:  VTE treatment  Allergies  Allergen Reactions   Dilaudid [Hydromorphone Hcl] Rash   Ramipril Rash and Other (See Comments)    Per patient on R arm and leg only; no angioedema   Tramadol Itching and Rash    Patient Measurements: Height: '5\' 7"'$  (170.2 cm) Weight: 72.1 kg (158 lb 15.2 oz) IBW/kg (Calculated) : 66.1  Vital Signs: Temp: 97.9 F (36.6 C) (09/18 0438) BP: 140/108 (09/18 1152) Pulse Rate: 86 (09/18 1152)  Labs: Recent Labs    03/11/22 1930 03/11/22 2009 03/11/22 2113 03/11/22 2120 03/12/22 0349 03/13/22 0355  HGB 8.5*  --   --  9.5* 8.3* 8.4*  HCT 27.8*  --   --  28.0* 27.3* 27.7*  PLT 168  --   --   --  129* 141*  LABPROT  --  18.5*  --   --   --   --   INR  --  1.6*  --   --   --   --   CREATININE 1.02  --   --  1.00 0.95 0.92  TROPONINIHS 63*  --  76*  --   --   --     Estimated Creatinine Clearance: 78.8 mL/min (by C-G formula based on SCr of 0.92 mg/dL).   Medical History: Past Medical History:  Diagnosis Date   Abnormal CT scan, gastrointestinal tract    Acute respiratory failure ([place]) 01/31/2022   Acute respiratory failure with hypoxemia ([facility]) 02/11/2022   Anemia    Anemia 08/06/2012   Aortic valve mass 12/30/2015   Bilateral shoulder pain 08/20/2012   Bite wound of left hand 09/27/2021   Cardiogenic shock ([place]) 01/31/2022   Community acquired pneumonia 11/07/2011   Critical limb ischemia with history of revascularization of same extremity ([place]) 04/28/2020   DDD (degenerative disc disease), lumbosacral     and grade 2 slip   DVT (deep venous thrombosis) ([place]) 01/31/2022   GERD (gastroesophageal reflux disease)    H/O: stroke 01/31/2022   Heart murmur    History of anemia    no current med.   History of COVID-19 05/10/2019   History of critical lower limb ischemia 02/19/2019   Hyperglycemia 01/31/2022   Hypertension    states is borderline on medication; has  been on med. x 5-6 yr.   Hypokalemia 05/30/2018   Impingement syndrome of shoulder region 10/2014   left   Insomnia 06/01/2015   Ischemic ulcer of toe of left foot ([facility])    Lactic acidosis 01/31/2022   Left shoulder pain 12/16/2015   Liver mass    Low back pain without sciatica 10/29/2009   Lupus ([place])    Multiple fractures of ribs, bilateral, due to CPR trauma 01/31/2022   Onychomycosis 01/28/2021   Paronychia of great toe 01/28/2021   Peripheral vascular disease ([place])    Pneumonia    Pressure injury of skin 01/31/2022   Pulmonary contusion 01/31/2022   RA (rheumatoid arthritis) ([place])    Rotator cuff tear 11/04/2014   STEMI (ST elevation myocardial infarction) ([place]) 01/31/2022   Sternal fracture 01/31/2022   Transaminitis 12/16/2015      Assessment: 61 year old male on Eliquis PTA for VTE. Patient with E.coli bacteremia, on antibiotics. Possible source urinary vs necrotic liver mets. IR consulted for possible biopsy of necrotic area in liver once patient off Eliquis. Pharmacy consulted for enoxaparin.   Plan:  -Hold Eliquis (last dose 9/17 pm) -  Start enoxaparin 72.5 mg (1 mg/kg) subcu BID -Monitor for signs and symptoms of bleeding -Continue to follow along for appropriate transition back to Eliquis when indicated  Tawnya Crook, PharmD, BCPS Clinical Pharmacist 03/13/2022 1:24 PM

## 2022-03-13 NOTE — Progress Notes (Signed)
PROGRESS NOTE   [name] [name]  PNT:[id number] DOB: 1961/01/05 DOA: 03/11/2022 PCP: Angelique Blonder, DO  Brief Narrative:  61 year old home dwelling) Known rheumatoid arthritis PAD status post left AKA 2021+ prior endarterectomies and extensive vascular surgery back in 2021 Inferior STEMI 01/2022 status post cath  Recent diagnosis of pancreatic mass 02/11/2022 concerning for possible metastatic esophageal CA in addition to exudative started on the chart Depression/anxiety  Brought in because acting altered cording to her roommate-quite somnolent-no urination over the whole past 24 hours prior to admission Tmax 103.3 Briefly hypotensive 90s over 50s WBC 5.3 hemoglobin 8.5 platelet 168 potassium 3.2 BUNs/creatinine 16/1.0 Bladder scan showed 500 cc in and out cath was performed  CT head old infarcts CT chest abdomen pelvis showed no new changes other than known large hepatic metastases and upper abdominal nodal mets with a 4.3 pseudocyst at the pancreatic head   Hospital-Problem based course  Possible sepsis on admission?  Urinary source versus necrotic liver metastases Broaden back antibiotics cefepime and Flagyl Continue LR 125 cc/H IR to see patient and determine if they can biopsy necrotic area in liver once patient is off Plavix/Xarelto  Urinary retention Resolved after Foley in and out-try to avoid recatheterization  ?  Metastatic esophageal cancer/pancreatic or upper GI Further determination as per Dr. Benay Spice I will add him to the treatment team Looks like he has an outpatient appointment 9/20  Prior fractured sternum and radiculopathy with prior rib fractures Continue oxycodone at a dose of 10 every 4 as needed continue gabapentin 100 a chest pain,  Added Flector patch and tylenol for pain  Prior pulmonary embolism/DVT Recurrent on 02/27/2022 Unclear if patient is compliant on meds-discharged on apixaban 5 twice daily Placing on lovenox now until procedure can be  accomplished  CABG status post stent 2020 Hold for now Plavix 75 daily secondary to need for biopsy, Lipitor 80 daily Noncompliant on Toprol-XL  PAD with stenting status post AKA left side 04/2020 Outpatient follow-up   DVT prophylaxis: Apixaban Code Status: Full Family Communication: None present Disposition:  Status is: Inpatient Remains inpatient appropriate because:   Needs further work-up   Consultants:  None  Procedures: No  Antimicrobials: Cefepime   Subjective:  Awake coherent  "I feel 90%" No fever no chills Some pain in both lower rib cages No dysuria  Objective: Vitals:   03/13/22 0440 03/13/22 0600 03/13/22 1100 03/13/22 1152  BP:    (!) 140/108  Pulse:    86  Resp:  17 (!) 21   Temp:      TempSrc:      SpO2:    96%  Weight: 72.1 kg     Height:        Intake/Output Summary (Last 24 hours) at 03/13/2022 1227 Last data filed at 03/13/2022 0440 Gross per 24 hour  Intake 340 ml  Output 1200 ml  Net -860 ml    Filed Weights   03/12/22 0318 03/12/22 0500 03/13/22 0440  Weight: 71.3 kg 71.8 kg 72.1 kg    Examination: EOMI NCAT no focal deficit no icterus or pallor Chest is clear no rales rhonchi wheeze Abdomen soft--? Slight tender in RUQ--cannot appreciate spleen Neurologically intact moving all 4 limbs equally no left AKA Psych coherent Skin not swollen no edema    Data Reviewed: personally reviewed   CBC    Component Value Date/Time   WBC 5.5 03/13/2022 0355   RBC 3.25 (L) 03/13/2022 0355   HGB 8.4 (L) 03/13/2022 0355  HGB 11.3 (L) 03/03/2022 0930   HGB 13.9 03/13/2017 1032   HCT 27.7 (L) 03/13/2022 0355   HCT 41.6 03/13/2017 1032   PLT 141 (L) 03/13/2022 0355   PLT 212 03/03/2022 0930   PLT 204 03/13/2017 1032   MCV 85.2 03/13/2022 0355   MCV 84 03/13/2017 1032   MCH 25.8 (L) 03/13/2022 0355   MCHC 30.3 03/13/2022 0355   RDW 16.9 (H) 03/13/2022 0355   RDW 13.7 03/13/2017 1032   LYMPHSABS 1.0 03/13/2022 0355    LYMPHSABS 1.8 03/13/2017 1032   MONOABS 0.5 03/13/2022 0355   EOSABS 0.5 03/13/2022 0355   EOSABS 0.2 03/13/2017 1032   BASOSABS 0.0 03/13/2022 0355   BASOSABS 0.0 03/13/2017 1032      Latest Ref Rng & Units 03/13/2022    3:55 AM 03/12/2022    3:49 AM 03/11/2022    9:20 PM  CMP  Glucose 70 - 99 mg/dL 92  125  99   BUN 8 - 23 mg/dL '12  15  14   '$ Creatinine 0.61 - 1.24 mg/dL 0.92  0.95  1.00   Sodium 135 - 145 mmol/L 140  140  138   Potassium 3.5 - 5.1 mmol/L 3.9  3.8  3.4   Chloride 98 - 111 mmol/L 109  110  103   CO2 22 - 32 mmol/L 24  25    Calcium 8.9 - 10.3 mg/dL 8.6  8.4    Total Protein 6.5 - 8.1 g/dL 6.3  6.6    Total Bilirubin 0.3 - 1.2 mg/dL 0.5  0.6    Alkaline Phos 38 - 126 U/L 202  203    AST 15 - 41 U/L 28  30    ALT 0 - 44 U/L 25  27       Radiology Studies: CT CHEST ABDOMEN PELVIS WO CONTRAST  Result Date: 03/11/2022 CLINICAL DATA:  Altered mental status. While the reported history is newly diagnosed pancreatic cancer, when correlating with recent PET-CT, the patient has metastatic distal esophageal cancer. EXAM: CT CHEST, ABDOMEN AND PELVIS WITHOUT CONTRAST TECHNIQUE: Multidetector CT imaging of the chest, abdomen and pelvis was performed following the standard protocol without IV contrast. RADIATION DOSE REDUCTION: This exam was performed according to the departmental dose-optimization program which includes automated exposure control, adjustment of the mA and/or kV according to patient size and/or use of iterative reconstruction technique. COMPARISON:  PET-CT dated 03/08/2022. CTA chest dated 02/27/2022. MRI abdomen dated 02/11/2022. CT abdomen/pelvis dated 02/11/2022. FINDINGS: CT CHEST FINDINGS Cardiovascular: The heart is normal in size. Trace pericardial fluid. No evidence thoracic aortic aneurysm. Atherosclerotic calcifications of the arch. Three vessel coronary atherosclerosis. Right chest power port terminates at the cavoatrial junction. Mediastinum/Nodes: Known  distal esophageal mass is not well visualized on unenhanced CT. Small mediastinal nodes, including a 9 mm short axis lower esophageal node (series 13/image 165) and 8 mm short axis low right paratracheal node (series 13/image 36). Visualized thyroid is unremarkable. Lungs/Pleura: Mild centrilobular emphysematous changes. Mild linear scarring/atelectasis in the right lower lobe. No focal consolidation. No suspicious pulmonary nodules, noting motion degradation. No pleural effusion or pneumothorax. Musculoskeletal: Healing anterior 2nd through 7th rib fractures bilaterally. Healing sternal fracture. Thoracic spine is within normal limits. CT ABDOMEN PELVIS FINDINGS Hepatobiliary: Large central hepatic mass measuring 8.3 cm, poorly evaluated on the current study, better evaluated on prior CT. Gallbladder is unremarkable. No intrahepatic or extrahepatic ductal dilatation. Pancreas: Parenchymal calcifications in the head/uncinate process, reflecting sequela chronic pancreatitis. Trace peripancreatic fluid  along the pancreatic tail (series 13/image 209), reflecting acute inflammatory change. 4.3 cm fluid density lesion in the pancreatic head (series 13/image 221), non FDG avid on recent PET-CT, likely reflecting a pseudocyst. Spleen: Within normal limits. Adrenals/Urinary Tract: Adrenal glands are within normal limits. Kidneys are within normal limits.  No hydronephrosis. Bladder is within normal limits. Stomach/Bowel: Stomach is within normal limits. No evidence of bowel obstruction. Normal appendix (series 13/image 278). Mild sigmoid diverticulosis, without evidence of diverticulitis. Vascular/Lymphatic: No evidence of abdominal aortic aneurysm. Atherosclerotic calcifications of the abdominal aorta and branch vessels. Upper abdominal lymphadenopathy, including a dominant 2.7 cm short axis gastrohepatic node (series 13/image 193). Additional small calcified para-aortic nodes, possibly reactive. Reproductive: Prostate is  unremarkable. Other: Mild presacral stranding.  No abdominopelvic ascites. Musculoskeletal: Mild degenerative changes of the lumbar spine. Status post PLIF at L5-S1. IMPRESSION: No interval change from recent PET-CT.  No acute findings. Known distal esophageal mass (corresponding to the patient's primary cancer) is not well visualized on unenhanced CT. Large hepatic metastasis.  Upper abdominal nodal metastases. Acute on chronic pancreatitis with 4.3 cm pseudocyst in the pancreatic head. Additional stable ancillary findings as above. Aortic Atherosclerosis (ICD10-I70.0) and Emphysema (ICD10-J43.9). Electronically Signed   By: Julian Hy M.D.   On: 03/11/2022 21:44   CT Head Wo Contrast  Result Date: 03/11/2022 CLINICAL DATA:  Altered mental status EXAM: CT HEAD WITHOUT CONTRAST TECHNIQUE: Contiguous axial images were obtained from the base of the skull through the vertex without intravenous contrast. RADIATION DOSE REDUCTION: This exam was performed according to the departmental dose-optimization program which includes automated exposure control, adjustment of the mA and/or kV according to patient size and/or use of iterative reconstruction technique. 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 No acute intracranial hemorrhage. No midline shift or other mass effect. Vascular: No abnormal hyperdensity of the major intracranial arteries or dural venous sinuses. No intracranial atherosclerosis. Skull: The visualized skull base, calvarium and extracranial soft tissues are normal. Sinuses/Orbits: No fluid levels or advanced mucosal thickening of the visualized paranasal sinuses. No mastoid or middle ear effusion. The orbits are normal. IMPRESSION: 1. No acute intracranial abnormality. 2. 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 Electronically Signed   By: Ulyses Jarred M.D.   On: 03/11/2022 21:39   DG  Chest Port 1 View  Result Date: 03/11/2022 CLINICAL DATA:  Questionable sepsis. EXAM: PORTABLE CHEST 1 VIEW COMPARISON:  Chest x-ray 02/27/2022 FINDINGS: Right chest port catheter tip projects over the SVC. This is new from prior. The heart is enlarged, unchanged. There is no focal lung infiltrate, pleural effusion or pneumothorax. There are atherosclerotic calcifications of the aorta. No acute fractures are seen. IMPRESSION: 1. New right-sided chest port with distal catheter tip projecting over the SVC. 2. Stable cardiomegaly. 3. The lungs are clear. Electronically Signed   By: Ronney Asters M.D.   On: 03/11/2022 19:59     Scheduled Meds:  apixaban  5 mg Oral BID   atorvastatin  80 mg Oral q1800   clopidogrel  75 mg Oral Daily   diclofenac  1 patch Transdermal BID   DULoxetine  30 mg Oral Daily   sodium chloride flush  3 mL Intravenous Q12H   Continuous Infusions:  cefTRIAXone (ROCEPHIN)  IV 2 g (03/13/22 1132)     LOS: 2 days   Time spent: [place], MD [facility] To contact the attending provider between 7A-7P  or the covering provider during after hours 7P-7A, please log into the web site [email or web address] and access using universal [place] password for that web site. If you do not have the password, please call the hospital operator.  03/13/2022, 12:27 PM

## 2022-03-14 ENCOUNTER — Inpatient Hospital Stay: Payer: Medicare Other | Admitting: Oncology

## 2022-03-14 DIAGNOSIS — R6510 Systemic inflammatory response syndrome (SIRS) of non-infectious origin without acute organ dysfunction: Secondary | ICD-10-CM | POA: Diagnosis not present

## 2022-03-14 LAB — COMPREHENSIVE METABOLIC PANEL
ALT: 22 U/L (ref 0–44)
AST: 23 U/L (ref 15–41)
Albumin: 2.4 g/dL — ABNORMAL LOW (ref 3.5–5.0)
Alkaline Phosphatase: 228 U/L — ABNORMAL HIGH (ref 38–126)
Anion gap: 9 (ref 5–15)
BUN: 11 mg/dL (ref 8–23)
CO2: 24 mmol/L (ref 22–32)
Calcium: 8.6 mg/dL — ABNORMAL LOW (ref 8.9–10.3)
Chloride: 106 mmol/L (ref 98–111)
Creatinine, Ser: 0.83 mg/dL (ref 0.61–1.24)
GFR, Estimated: >60 mL/min (ref >60)
Glucose, Bld: 98 mg/dL (ref 70–99)
Potassium: 3.7 mmol/L (ref 3.5–5.1)
Sodium: 139 mmol/L (ref 135–145)
Total Bilirubin: 0.5 mg/dL (ref 0.3–1.2)
Total Protein: 6.4 g/dL — ABNORMAL LOW (ref 6.5–8.1)

## 2022-03-14 LAB — CBC WITH DIFFERENTIAL/PLATELET
Abs Immature Granulocytes: 0.02 10*3/uL (ref 0.00–0.07)
Basophils Absolute: 0.1 10*3/uL (ref 0.0–0.1)
Basophils Relative: 1 %
Eosinophils Absolute: 0.4 10*3/uL (ref 0.0–0.5)
Eosinophils Relative: 7 %
HCT: 29.3 % — ABNORMAL LOW (ref 39.0–52.0)
Hemoglobin: 9 g/dL — ABNORMAL LOW (ref 13.0–17.0)
Immature Granulocytes: 0 %
Lymphocytes Relative: 16 %
Lymphs Abs: 1 10*3/uL (ref 0.7–4.0)
MCH: 25.8 pg — ABNORMAL LOW (ref 26.0–34.0)
MCHC: 30.7 g/dL (ref 30.0–36.0)
MCV: 84 fL (ref 80.0–100.0)
Monocytes Absolute: 0.4 10*3/uL (ref 0.1–1.0)
Monocytes Relative: 7 %
Neutro Abs: 4.1 10*3/uL (ref 1.7–7.7)
Neutrophils Relative %: 69 %
Platelets: 164 10*3/uL (ref 150–400)
RBC: 3.49 MIL/uL — ABNORMAL LOW (ref 4.22–5.81)
RDW: 16.8 % — ABNORMAL HIGH (ref 11.5–15.5)
WBC: 5.9 10*3/uL (ref 4.0–10.5)
nRBC: 0 % (ref 0.0–0.2)

## 2022-03-14 LAB — CULTURE, BLOOD (ROUTINE X 2)

## 2022-03-14 MED ORDER — SODIUM CHLORIDE 0.9% FLUSH
10.0000 mL | Freq: Two times a day (BID) | INTRAVENOUS | Status: DC
  Administered 2022-03-15: 10 mL
  Administered 2022-03-16: 20 mL

## 2022-03-14 MED ORDER — CEFAZOLIN SODIUM-DEXTROSE 2-4 GM/100ML-% IV SOLN
2.0000 g | Freq: Three times a day (TID) | INTRAVENOUS | Status: DC
  Administered 2022-03-15 – 2022-03-18 (×9): 2 g via INTRAVENOUS
  Filled 2022-03-14 (×11): qty 100

## 2022-03-14 MED ORDER — CHLORHEXIDINE GLUCONATE CLOTH 2 % EX PADS
6.0000 | MEDICATED_PAD | Freq: Every day | CUTANEOUS | Status: DC
  Administered 2022-03-14 – 2022-03-18 (×5): 6 via TOPICAL

## 2022-03-14 NOTE — Progress Notes (Signed)
IP PROGRESS NOTE  Subjective:   [name] [name] appears unchanged.  No new complaint.  He continues to have soreness at the right anterior chest wall.  Objective: Vital signs in last 24 hours: Blood pressure (!) 145/88, pulse 93, temperature 97.6 F (36.4 C), temperature source Oral, resp. rate 18, height '5\' 7"'$  (1.702 m), weight 158 lb 15.2 oz (72.1 kg), SpO2 98 %.  Intake/Output from previous day: 09/18 0701 - 09/19 0700 In: 480 [P.O.:480] Out: 1675 [Urine:1675]  Physical Exam:  HEENT: No thrush Abdomen: No hepatosplenomegaly, nontender Extremities: No right leg edema Musculoskeletal: Mild tenderness at the low right anterior chest wall   Portacath/PICC-without erythema, resolving ecchymosis inferior to the Port-A-Cath  Lab Results: Recent Labs    03/13/22 0355 03/14/22 0415  WBC 5.5 5.9  HGB 8.4* 9.0*  HCT 27.7* 29.3*  PLT 141* 164    BMET Recent Labs    03/13/22 0355 03/14/22 0415  NA 140 139  K 3.9 3.7  CL 109 106  CO2 24 24  GLUCOSE 92 98  BUN 12 11  CREATININE 0.92 0.83  CALCIUM 8.6* 8.6*    Lab Results  Component Value Date   CEA1 287.0 (H) 02/11/2022   CEA 644.67 (H) 03/03/2022   OEU235 88 (H) 02/11/2022    Studies/Results: No results found.  Medications: I have reviewed the patient's current medications.  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 -02/11/2022 CTA chest-7.8 x 8.5 cm subtle hypoattenuating mass lesion in segment IV of the liver. -02/11/2022 CT abdomen/pelvis-8.7 x 7.8 or ill-defined irregular mass in the medial segment left liver, 4.5 x 3.4 cm necrotic mass lesion just cranial to the esophagogastric junction with metastatic lymphadenopathy in the abdomen and pelvis, irregular nodular peritoneal thickening in the lower abdomen and pelvis -02/11/2022 MRI abdomen-rim-enhancing  likely necrotic mass or lymph node conglomerate centered around the gastroesophageal junction measuring 5.0 x 3.9 cm, large rim-enhancing likely necrotic liver mass centered in the anterior left lobe of the liver measuring 7.8 x 7.2 cm, enlarged gastrohepatic ligament and retroperitoneal lymph nodes -Labs from 02/11/2022-AFP 2.4, CA 19.9 88, CEA 287 -Ultrasound-guided liver biopsy performed 02/15/2022-adenocarcinoma with extensive necrosis, CK7 positive, negative for CK20, CDX2, TTF-1, and PAX8, consistent with a pancreatic, lung, or upper gastrointestinal primary -PET 03/08/2022-hypermetabolic distal esophagus mass, hypermetabolic centrally necrotic hepatic mass, hypermetabolic periportal nodes, exophytic GE junction mass without hypermetabolism-likely benign lesion, no hypermetabolism in the pancreas head, bilateral hypermetabolic adrenal gland lesions, small hypermetabolic left parotid gland lesion -CTs 03/11/2022-no change from PET 03/08/2022, known distal esophagus mass-not visualized on unenhanced CT, large hepatic metastasis and upper abdominal nodal metastases, chronic pancreatitis with 4.3 cm pseudocyst in the pancreas head 2.  Myocardial infarction/cardiac arrest 01/31/2022 3.  Microcytic anemia -Labs from 02/11/2022-ferritin 151, iron 13, percent saturation 5% 4.  DVT/pulmonary embolism-bilateral lower extremity DVTs 01/10/2022, pulmonary emboli on chest CT 01/31/2022-apixaban 5.  PAD status post AKA in 2021 6.  RA 7.  Bilateral pleural effusions -Ultrasound-guided thoracentesis performed on 02/11/2022.  Cytology negative for malignancy. 8.  COPD 9.  Admission 03/11/2022 with E. coli bacteremia  [name] [name] appears stable.  Continue ceftriaxone.  The sensitivity on the E. coli is pending.  I discussed the case with interventional radiology.  They feel the liver lesion is amenable to aspiration.  He will need to be off of Plavix for 5 days prior to the procedure.  It is possible  he has developed an  abscess within a necrotic liver metastasis.  [name] [name] has metastatic esophagus cancer.  The plan is to initiate systemic therapy as an outpatient.  We are waiting on results of molecular testing to determine the systemic treatment regimen.   Recommendations: Continue IV antibiotics, follow-up sensitivities on the E. Coli convert to an oral regimen per the medical service IR aspiration of the necrotic liver metastasis Okay to access and use the Port-A-Cath  Outpatient follow-up will be scheduled at the Cancer center to begin systemic therapy for treatment of metastatic esophagus cancer. Continue Lovenox, resume apixaban/Plavix when okay with interventional radiology       LOS: 3 days   Betsy Coder, MD   03/14/2022, 8:30 AM

## 2022-03-14 NOTE — Progress Notes (Addendum)
PROGRESS NOTE   ZAKHAI MEISINGER  HUD:[id number] DOB: 01-31-1961 DOA: 03/11/2022 PCP: Angelique Blonder, DO  Brief Narrative:  61 year old home dwelling) Known rheumatoid arthritis PAD status post left AKA 2021+ prior endarterectomies and extensive vascular surgery back in 2021 Inferior STEMI 01/2022 status post cath  Recent diagnosis of pancreatic mass 02/11/2022 concerning for possible metastatic esophageal CA Depression/anxiety  Brought in because acting altered cording to her roommate-quite somnolent-no urination over the whole past 24 hours prior to admission Tmax 103.3 Briefly hypotensive 90s over 50s WBC 5.3 hemoglobin 8.5 platelet 168 potassium 3.2 BUNs/creatinine 16/1.0 Bladder scan showed 500 cc in and out cath was performed  CT head old infarcts CT chest abdomen pelvis showed no new changes other than known large hepatic metastases and upper abdominal nodal mets with a 4.3 pseudocyst at the pancreatic head  Because unclear etiology fever it was felt that this may be related to necrotic liver mass and we are trying to get sampling of the same but because patient is on aspirin and Plavix need to wait 5 days for Plavix to wear off  Hospital-Problem based course  Sepsis on admission blood cultures growing E. coli urine culture negative Discussed with ID pharmacist-antibiotics switched to Ancef and Flagyl--anticipate will need at least 4 weeks of therapy--- I have asked ID physician to informally comment and help with duration and choice of regimen-appreciate input in advance Continue LR 125 cc/H IR to see patient and determine if they can biopsy necrotic area in liver once patient is off Plavix/Xarelto--earliest this can be done would be probably 9/22  Urinary retention Resolved after Foley in and out-try to avoid recatheterization  ?  Metastatic esophageal cancer/pancreatic or upper GI appreciate Dr. Benay Spice input Looks like he has an outpatient appointment 9/20  Prior fractured  sternum and radiculopathy with prior rib fractures Continue oxycodone at a dose of 10 every 4 as needed continue gabapentin 100 a chest pain,  continue Flector patch and tylenol for pain Pain is manageable at this time  Prior pulmonary embolism/DVT Recurrent on 02/27/2022 Unclear if patient is compliant on meds-discharged on apixaban 5 twice daily Placing on lovenox now until biopsy procedure can be accomplished  CABG status post stent 2020 Hold for now Plavix 75 daily secondary to need for biopsy, Lipitor 80 daily Noncompliant on Toprol-XL  PAD with stenting status post AKA left side 04/2020 Outpatient follow-up   DVT prophylaxis: Lovenox until procedure then switch back to apixaban Code Status: Full Family Communication: None present Disposition:  Status is: Inpatient Remains inpatient appropriate because:   Needs further work-up   Consultants:  None  Procedures: No  Antimicrobials: Cefepime   Subjective:  Overall doing okay-pain is manageable No distress Eating drinking Mild bilateral chest pain no nausea no vomiting   Objective: Vitals:   03/13/22 1410 03/14/22 0758 03/14/22 0900 03/14/22 0952  BP: (!) 145/88   (!) 141/100  Pulse: 93   97  Resp: 18 (!) 28 (!) 22   Temp: 97.6 F (36.4 C)   98.1 F (36.7 C)  TempSrc: Oral   Oral  SpO2: 98%   96%  Weight:      Height:        Intake/Output Summary (Last 24 hours) at 03/14/2022 1259 Last data filed at 03/14/2022 0900 Gross per 24 hour  Intake 600 ml  Output 2325 ml  Net -1725 ml    Filed Weights   03/12/22 0318 03/12/22 0500 03/13/22 0440  Weight: 71.3 kg 71.8 kg 72.1  kg    Examination:  Coherent awake alert no distress EOMI NCAT no focal deficit no icterus no pallor S1-S2 no murmur regular rate rhythm Abdomen soft slightly tender right upper quadrant no rebound no guarding ROM intact moving 4 limbs equally Neuro intact power breast deferred Psych euthymic   Data Reviewed: personally  reviewed   CBC    Component Value Date/Time   WBC 5.9 03/14/2022 0415   RBC 3.49 (L) 03/14/2022 0415   HGB 9.0 (L) 03/14/2022 0415   HGB 11.3 (L) 03/03/2022 0930   HGB 13.9 03/13/2017 1032   HCT 29.3 (L) 03/14/2022 0415   HCT 41.6 03/13/2017 1032   PLT 164 03/14/2022 0415   PLT 212 03/03/2022 0930   PLT 204 03/13/2017 1032   MCV 84.0 03/14/2022 0415   MCV 84 03/13/2017 1032   MCH 25.8 (L) 03/14/2022 0415   MCHC 30.7 03/14/2022 0415   RDW 16.8 (H) 03/14/2022 0415   RDW 13.7 03/13/2017 1032   LYMPHSABS 1.0 03/14/2022 0415   LYMPHSABS 1.8 03/13/2017 1032   MONOABS 0.4 03/14/2022 0415   EOSABS 0.4 03/14/2022 0415   EOSABS 0.2 03/13/2017 1032   BASOSABS 0.1 03/14/2022 0415   BASOSABS 0.0 03/13/2017 1032      Latest Ref Rng & Units 03/14/2022    4:15 AM 03/13/2022    3:55 AM 03/12/2022    3:49 AM  CMP  Glucose 70 - 99 mg/dL 98  92  125   BUN 8 - 23 mg/dL '11  12  15   '$ Creatinine 0.61 - 1.24 mg/dL 0.83  0.92  0.95   Sodium 135 - 145 mmol/L 139  140  140   Potassium 3.5 - 5.1 mmol/L 3.7  3.9  3.8   Chloride 98 - 111 mmol/L 106  109  110   CO2 22 - 32 mmol/L '24  24  25   '$ Calcium 8.9 - 10.3 mg/dL 8.6  8.6  8.4   Total Protein 6.5 - 8.1 g/dL 6.4  6.3  6.6   Total Bilirubin 0.3 - 1.2 mg/dL 0.5  0.5  0.6   Alkaline Phos 38 - 126 U/L 228  202  203   AST 15 - 41 U/L '23  28  30   '$ ALT 0 - 44 U/L '22  25  27      '$ Radiology Studies: No results found.   Scheduled Meds:  atorvastatin  80 mg Oral q1800   diclofenac  1 patch Transdermal BID   DULoxetine  30 mg Oral Daily   enoxaparin (LOVENOX) injection  1 mg/kg Subcutaneous BID   metroNIDAZOLE  500 mg Oral Q12H   sodium chloride flush  3 mL Intravenous Q12H   Continuous Infusions:  [START ON 03/15/2022]  ceFAZolin (ANCEF) IV       LOS: 3 days   Time spent: [place], MD [facility] To contact the attending provider between 7A-7P or the covering provider during after hours 7P-7A, please log into the  web site [email or web address] and access using universal [place] password for that web site. If you do not have the password, please call the hospital operator.  03/14/2022, 12:59 PM

## 2022-03-14 NOTE — Plan of Care (Signed)
  Problem: Education: Goal: Knowledge of General Education information will improve Description: Including pain rating scale, medication(s)/side effects and non-pharmacologic comfort measures Outcome: Progressing   Problem: Clinical Measurements: Goal: Ability to maintain clinical measurements within normal limits will improve Outcome: Progressing   

## 2022-03-14 NOTE — Progress Notes (Signed)
  Transition of Care [facility]) Screening Note   Patient Details  Name: [name] [name] Date of Birth: March 22, 1961   Transition of Care [facility]) CM/SW Contact:    Roseanne Kaufman, RN Phone Number: 03/14/2022, 5:04 PM    Transition of Care Department [facility]) has reviewed patient and no TOC needs have been identified at this time. We will continue to monitor patient advancement through interdisciplinary progression rounds. If new patient transition needs arise, please place a TOC consult.

## 2022-03-15 ENCOUNTER — Inpatient Hospital Stay: Payer: Medicare Other

## 2022-03-15 DIAGNOSIS — R6510 Systemic inflammatory response syndrome (SIRS) of non-infectious origin without acute organ dysfunction: Secondary | ICD-10-CM | POA: Diagnosis not present

## 2022-03-15 LAB — CBC WITH DIFFERENTIAL/PLATELET
Abs Immature Granulocytes: 0.02 10*3/uL (ref 0.00–0.07)
Basophils Absolute: 0.1 10*3/uL (ref 0.0–0.1)
Basophils Relative: 1 %
Eosinophils Absolute: 0.4 10*3/uL (ref 0.0–0.5)
Eosinophils Relative: 6 %
HCT: 29.6 % — ABNORMAL LOW (ref 39.0–52.0)
Hemoglobin: 9 g/dL — ABNORMAL LOW (ref 13.0–17.0)
Immature Granulocytes: 0 %
Lymphocytes Relative: 15 %
Lymphs Abs: 1 10*3/uL (ref 0.7–4.0)
MCH: 25.7 pg — ABNORMAL LOW (ref 26.0–34.0)
MCHC: 30.4 g/dL (ref 30.0–36.0)
MCV: 84.6 fL (ref 80.0–100.0)
Monocytes Absolute: 0.4 10*3/uL (ref 0.1–1.0)
Monocytes Relative: 6 %
Neutro Abs: 4.8 10*3/uL (ref 1.7–7.7)
Neutrophils Relative %: 72 %
Platelets: 205 10*3/uL (ref 150–400)
RBC: 3.5 MIL/uL — ABNORMAL LOW (ref 4.22–5.81)
RDW: 16.8 % — ABNORMAL HIGH (ref 11.5–15.5)
WBC: 6.7 10*3/uL (ref 4.0–10.5)
nRBC: 0 % (ref 0.0–0.2)

## 2022-03-15 LAB — BASIC METABOLIC PANEL
Anion gap: 9 (ref 5–15)
BUN: 9 mg/dL (ref 8–23)
CO2: 25 mmol/L (ref 22–32)
Calcium: 8.5 mg/dL — ABNORMAL LOW (ref 8.9–10.3)
Chloride: 107 mmol/L (ref 98–111)
Creatinine, Ser: 0.72 mg/dL (ref 0.61–1.24)
GFR, Estimated: >60 mL/min (ref >60)
Glucose, Bld: 89 mg/dL (ref 70–99)
Potassium: 3.6 mmol/L (ref 3.5–5.1)
Sodium: 141 mmol/L (ref 135–145)

## 2022-03-15 MED ORDER — AMLODIPINE BESYLATE 10 MG PO TABS
10.0000 mg | ORAL_TABLET | Freq: Every day | ORAL | Status: DC
  Administered 2022-03-15 – 2022-03-18 (×4): 10 mg via ORAL
  Filled 2022-03-15 (×4): qty 1

## 2022-03-15 MED ORDER — HYDRALAZINE HCL 20 MG/ML IJ SOLN: 5.0000 mg | Freq: Once | INTRAMUSCULAR | Status: DC

## 2022-03-15 MED ORDER — CHLORTHALIDONE 25 MG PO TABS
25.0000 mg | ORAL_TABLET | Freq: Every day | ORAL | Status: DC
  Administered 2022-03-15 – 2022-03-18 (×4): 25 mg via ORAL
  Filled 2022-03-15 (×4): qty 1

## 2022-03-15 MED ORDER — HYDRALAZINE HCL 20 MG/ML IJ SOLN
10.0000 mg | Freq: Once | INTRAMUSCULAR | Status: AC
  Administered 2022-03-15: 10 mg via INTRAVENOUS
  Filled 2022-03-15: qty 1

## 2022-03-15 MED ORDER — ENSURE ENLIVE PO LIQD
237.0000 mL | Freq: Two times a day (BID) | ORAL | Status: DC
  Administered 2022-03-15 – 2022-03-18 (×4): 237 mL via ORAL

## 2022-03-15 NOTE — Discharge Planning (Signed)
Oncology Discharge Planning Note  [facility] [place] Address: [address] [place], [place], [place] [address] Hours of Operation:  Nena Polio, Monday - Friday  Clinic Contact Information:  [phone]  Oncology Care Team: Medical Oncologist:  Dr. Betsy Coder  Patient Details: Name:  Donnavan, Covault MRN:   [id number] DOB:   1960-10-19 Reason for Current Admission: SIRS (systemic inflammatory response syndrome) [facility])  Discharge Planning Narrative: Notification of admission received by [facility] for [name].  Discharge follow-up appointments for oncology will be determined when D/C date is known. Dr. Benay Spice continues to round on him daily. Upon discharge from the hospital, hematology/oncology's post discharge plan of care for the outpatient setting is: Stabilize acute issue and begin chemotherapy with FOLFOX   [name] will be called within two business days after discharge to review hematology/oncology's plan of care for full understanding.    Outpatient Oncology Specific Care Only: Oncology appointment transportation needs addressed?:  yes. His friend Marnette Burgess will provide transportation Oncology medication management for symptom management addressed?:  not applicable Chemo Alert Card reviewed?:  not applicable Immunotherapy Alert Card reviewed?:  not applicable

## 2022-03-15 NOTE — Progress Notes (Signed)
Referring Physician(s): [name] [name]  Supervising Physician: [name] [name]  Patient Status:  [name] [name] - In-pt  Chief Complaint: Nausea, diminished appetite, metastatic adenocarcinoma, likely esophageal primary   Subjective: Pt known to IR service from right thoracentesis in 2013 and left thoracentesis on 02/11/2022 ,left lobe liver bx on 02/15/22, and port a cath placement on 03/07/22. He has a hx of metastatic adenocarcinoma of likely esophageal primary, MI/cardiac arrest 01/31/22, anemia, bilat LE DVT's/PE, PAD with prior left AKA, RA, pleural effusions, COPD  and was admitted to [facility] on 03/11/22 with e coli bacteremia. He is on rocephin. Latest CT C/A/P on 03/11/22 revealed:    No interval change from recent PET-CT.  No acute findings.   Known distal esophageal mass (corresponding to the patient's primary cancer) is not well visualized on unenhanced CT.   Large hepatic metastasis.  Upper abdominal nodal metastases.   Acute on chronic pancreatitis with 4.3 cm pseudocyst in the pancreatic head.   Additional stable ancillary findings as above.   Aortic Atherosclerosis (ICD10-I70.0) and Emphysema    He has been off eliquis and plavix in anticipation of undegoing liver lesion aspiration to rule out associated infection of necrotic liver mass(prev + bx). He currently denies fever, HA, CP, worsening dyspnea, abd pain, vomiting or bleeding. He does have nausea, back pain, occ cough, decreased appetite. Latest labs include nl WBC, hgb 9, plts nl, PT/INR 18.5/1.6; nl creat.   Past Medical History:  Diagnosis Date   Abnormal CT scan, gastrointestinal tract    Acute respiratory failure ([place]) 01/31/2022   Acute respiratory failure with hypoxemia (HCC) 02/11/2022   Anemia    Anemia 08/06/2012   Aortic valve mass 12/30/2015   Bilateral shoulder pain 08/20/2012   Bite wound of left hand 09/27/2021   Cardiogenic shock ([place]) 01/31/2022   Community acquired pneumonia 11/07/2011   Critical limb ischemia  with history of revascularization of same extremity ([place]) 04/28/2020   DDD (degenerative disc disease), lumbosacral     and grade 2 slip   DVT (deep venous thrombosis) ([place]) 01/31/2022   GERD (gastroesophageal reflux disease)    H/O: stroke 01/31/2022   Heart murmur    History of anemia    no current med.   History of COVID-19 05/10/2019   History of critical lower limb ischemia 02/19/2019   Hyperglycemia 01/31/2022   Hypertension    states is borderline on medication; has been on med. x 5-6 yr.   Hypokalemia 05/30/2018   Impingement syndrome of shoulder region 10/2014   left   Insomnia 06/01/2015   Ischemic ulcer of toe of left foot (HCC)    Lactic acidosis 01/31/2022   Left shoulder pain 12/16/2015   Liver mass    Low back pain without sciatica 10/29/2009   Lupus ([place])    Multiple fractures of ribs, bilateral, due to CPR trauma 01/31/2022   Onychomycosis 01/28/2021   Paronychia of great toe 01/28/2021   Peripheral vascular disease ([place])    Pneumonia    Pressure injury of skin 01/31/2022   Pulmonary contusion 01/31/2022   RA (rheumatoid arthritis) ([place])    Rotator cuff tear 11/04/2014   STEMI (ST elevation myocardial infarction) ([place]) 01/31/2022   Sternal fracture 01/31/2022   Transaminitis 12/16/2015   Past Surgical History:  Procedure Laterality Date   ABDOMINAL AORTAGRAM  02/20/2019   ABDOMINAL AORTOGRAM W/LOWER EXTREMITY N/A 02/20/2019   Procedure: ABDOMINAL AORTOGRAM W/LOWER EXTREMITY;  Surgeon: Marty Heck, MD;  Location: [place] [facility];  Service: Cardiovascular;  Laterality: N/A;   ACHILLES TENDON SURGERY Bilateral    AMPUTATION Left 05/11/2020   Procedure: LEFT ABOVE KNEE AMPUTATION;  Surgeon: Angelia Mould, MD;  Location: [place];  Service: Vascular;  Laterality: Left;   ENDARTERECTOMY POPLITEAL Left 05/01/2020   Procedure: ENDARTERECTOMY POPLITEAL;  Surgeon: Waynetta Sandy, MD;  Location: [place];  Service: Vascular;  Laterality: Left;   FASCIOTOMY Left 05/01/2020    Procedure: FASCIOTOMY;  Surgeon: Waynetta Sandy, MD;  Location: [place];  Service: Vascular;  Laterality: Left;   FEMORAL-POPLITEAL BYPASS GRAFT Left 02/26/2019   Procedure: BYPASS GRAFT FEMORAL-POPLITEAL ARTERY LEFT LEG USING 80m PROPATEN GRAFT;  Surgeon: BSerafina Mitchell MD;  [place]  Service: Vascular;  Laterality: Left;   INTRAOPERATIVE ARTERIOGRAM Left 01/22/2020   Procedure: INTRA OPERATIVE ARTERIOGRAM with administration of thrombolyics in Left femoral to popliteal bypass.;  Surgeon: CMarty Heck MD;  [place]  Service: Vascular;  Laterality: Left;   IR IMAGING GUIDED PORT INSERTION  03/07/2022   LEFT HEART CATH AND CORONARY ANGIOGRAPHY N/A 03/15/2017   Procedure: LEFT HEART CATH AND CORONARY ANGIOGRAPHY;  Surgeon: ENelva Bush MD;  [facility];  Service: Cardiovascular;  Laterality: N/A;   LEFT HEART CATH AND CORONARY ANGIOGRAPHY N/A 02/06/2022   Procedure: LEFT HEART CATH AND CORONARY ANGIOGRAPHY;  Surgeon: JMartinique Peter M, MD;  [place] [facility];  Service: Cardiovascular;  Laterality: N/A;   LOWER EXTREMITY INTERVENTION Left 04/29/2020   Procedure: LOWER EXTREMITY INTERVENTION- LYSIS;  Surgeon: CMarty Heck MD;  [facility];  Service: Cardiovascular;  Laterality: Left;   OSTEOTOMY AND ULNAR SHORTENING Right 07/22/2002   PATCH ANGIOPLASTY Left 05/01/2020   Procedure: PATCH ANGIOPLASTY USING HEMASHIELD PLATINUM FINESSE PATCH;  Surgeon: CWaynetta Sandy MD;  [place]  Service: Vascular;  Laterality: Left;   PERIPHERAL VASCULAR ATHERECTOMY  01/23/2020   Procedure: PERIPHERAL VASCULAR ATHERECTOMY;  Surgeon: CMarty Heck MD;  [place];  Service: Cardiovascular;;   PERIPHERAL VASCULAR BALLOON ANGIOPLASTY  01/23/2020   Procedure: PERIPHERAL VASCULAR BALLOON ANGIOPLASTY;  Surgeon: CMarty Heck MD;  [place] [facility];  Service: Cardiovascular;;    PERIPHERAL VASCULAR INTERVENTION Left 04/30/2020   Procedure: PERIPHERAL VASCULAR INTERVENTION;  Surgeon: HCherre Robins MD;  [place] [facility];  Service: Cardiovascular;  Laterality: Left;   PERIPHERAL VASCULAR THROMBECTOMY N/A 01/23/2020   Procedure: lysis recheck;  Surgeon: CMarty Heck MD;  [place] [facility];  Service: Cardiovascular;  Laterality: N/A;  + [place]    PERIPHERAL VASCULAR THROMBECTOMY N/A 04/30/2020   Procedure: Lysis Recheck;  Surgeon: HCherre Robins MD;  [place] [facility];  Service: Cardiovascular;  Laterality: N/A;   SHOULDER ARTHROSCOPY WITH BICEPSTENOTOMY Right 03/11/2015   Procedure: SHOULDER ARTHROSCOPY WITH BICEPSTENOTOMY;  Surgeon: DNinetta Lights MD;  [place]  Service: Orthopedics;  Laterality: Right;   SHOULDER ARTHROSCOPY WITH DISTAL CLAVICLE RESECTION Left 11/19/2014   Procedure: SHOULDER ARTHROSCOPY WITH DISTAL CLAVICLE RESECTION;  Surgeon: DKathryne Hitch MD;  [place]  Service: Orthopedics;  Laterality: Left;   SHOULDER ARTHROSCOPY WITH ROTATOR CUFF REPAIR AND SUBACROMIAL DECOMPRESSION Left 11/19/2014   Procedure: LEFT SHOULDER ARTHROSCOPY, DEBRIDEMENT DISTAL CLAVICLE EXCISION, ACROMIOPLASTY WITH ROTATOR CUFF REPAIR ;  Surgeon: DKathryne Hitch MD;  [place]  Service: Orthopedics;  Laterality: Left;   THROMBECTOMY OF BYPASS GRAFT FEMORAL- POPLITEAL ARTERY Left 05/01/2020  Procedure: THROMBECTOMY OF LOWER EXTREMITY;  Surgeon: Waynetta Sandy, MD;  Location: [place];  Service: Vascular;  Laterality: Left;   TRANSFORAMINAL LUMBAR INTERBODY FUSION (TLIF) WITH PEDICLE SCREW FIXATION 1 LEVEL N/A 01/03/2018   Procedure: TRANSFORAMINAL LUMBAR INTERBODY FUSION (TLIF) LUMBAR FIVE-SACRAL ONE;  Surgeon: Melina Schools, MD;  Location: [place];  Service: Orthopedics;  Laterality: N/A;   ULTRASOUND GUIDANCE FOR VASCULAR ACCESS Right 01/22/2020    Procedure: ULTRASOUND GUIDANCE FOR VASCULAR ACCESS Right Common Femoral Artery.;  Surgeon: Marty Heck, MD;  Location: [place];  Service: Vascular;  Laterality: Right;   VIDEO ASSISTED THORACOSCOPY (VATS)/DECORTICATION Right 11/21/2011   drainage of empyema        Allergies: Dilaudid [hydromorphone hcl], Ramipril, and Tramadol  Medications: Prior to Admission medications   Medication Sig Start Date End Date Taking? Authorizing Provider  acetaminophen (TYLENOL) 500 MG tablet Take 1,000 mg by mouth every 6 (six) hours as needed for mild pain.   Yes [provider]  amLODipine (NORVASC) 10 MG tablet Take 10 mg by mouth daily.   Yes [provider]  apixaban (ELIQUIS) 5 MG TABS tablet Take 1 tablet (5 mg total) by mouth 2 (two) times daily. 02/28/22  Yes Mapp, Tavien, MD  atorvastatin (LIPITOR) 80 MG tablet Take 1 tablet (80 mg total) by mouth daily at 6 PM. 02/20/22  Yes Serita Butcher, MD  chlorthalidone (HYGROTON) 25 MG tablet Take 25 mg by mouth daily.   Yes [provider]  diclofenac Sodium (VOLTAREN) 1 % GEL Apply 2-4 g topically in the morning, at noon, in the evening, and at bedtime. 12/26/21  Yes [provider]  docusate sodium (COLACE) 100 MG capsule Take 1 capsule (100 mg total) by mouth daily. 01/27/21  Yes Lyndal Pulley, MD  DULoxetine (CYMBALTA) 30 MG capsule Take 30 mg by mouth daily as needed (depression). 12/26/21  Yes [provider]  gabapentin (NEURONTIN) 100 MG capsule Take 100 mg by mouth at bedtime as needed (for pain).  12/31/19  Yes [provider]  lidocaine-prilocaine (EMLA) cream Apply 1 Application topically as needed. 03/03/22  Yes Owens Shark, NP  Multiple Vitamins-Minerals (MENS MULTIVITAMIN PO) Take 1 tablet by mouth daily.   Yes [provider]  ondansetron (ZOFRAN) 8 MG tablet Take 1 tablet (8 mg total) by mouth every 8 (eight) hours as needed for nausea or vomiting. 03/03/22  Yes Owens Shark, NP   oxyCODONE-acetaminophen (PERCOCET) 10-325 MG tablet Take 1 tablet by mouth every 6 (six) hours as needed for pain. 03/02/22 04/01/22 Yes Idamae Schuller, MD  prochlorperazine (COMPAZINE) 10 MG tablet Take 1 tablet (10 mg total) by mouth every 6 (six) hours as needed for nausea or vomiting. 03/03/22  Yes Owens Shark, NP  clopidogrel (PLAVIX) 75 MG tablet Take 1 tablet (75 mg total) by mouth daily. Patient not taking: Reported on 03/12/2022 07/18/21 07/18/22  Delene Ruffini, MD  lidocaine (LIDODERM) 5 % Place 1 patch onto the skin daily. Remove & Discard patch within 12 hours or as directed by MD For chest wall pain Patient not taking: Reported on 02/22/2022 02/09/22   Florencia Reasons, MD  metoprolol tartrate (LOPRESSOR) 25 MG tablet Take 0.5 tablets (12.5 mg total) by mouth 2 (two) times daily. Patient not taking: Reported on 02/27/2022 02/07/22   Charlynne Cousins, MD  naloxone [facility]) nasal spray 4 mg/0.1 mL Place 1 spray into the nose as needed. Patient not taking: Reported on 02/22/2022 12/26/21   [provider]     Vital Signs: BP (!) 152/102 (BP Location: Right Arm)   Pulse 100   Temp 97.7 F (36.5 C) (Oral)   Resp 17   Ht '5\' 7"'$  (1.702 m)   Wt 157 lb 5.1 oz (71.4 kg)   SpO2 99%   BMI 24.64 kg/m   Physical Exam awake/alert; chest- distant BS bilat; clean, intact rt chest port a cath, heart- sl tachycardic, occ ectopy; abd- soft,+BS,NT; left AKA, no RLE edema  Imaging: CT CHEST ABDOMEN PELVIS WO CONTRAST  Result Date: 03/11/2022 CLINICAL DATA:  Altered mental status. While the reported history is newly diagnosed pancreatic cancer, when correlating with recent PET-CT, the patient has metastatic distal esophageal cancer. EXAM: CT CHEST, ABDOMEN AND PELVIS WITHOUT CONTRAST TECHNIQUE: Multidetector CT imaging of the chest, abdomen and pelvis was performed following the standard protocol without IV contrast. RADIATION DOSE REDUCTION: This exam was performed according to the departmental  dose-optimization program which includes automated exposure control, adjustment of the mA and/or kV according to patient size and/or use of iterative reconstruction technique. COMPARISON:  PET-CT dated 03/08/2022. CTA chest dated 02/27/2022. MRI abdomen dated 02/11/2022. CT abdomen/pelvis dated 02/11/2022. FINDINGS: CT CHEST FINDINGS Cardiovascular: The heart is normal in size. Trace pericardial fluid. No evidence thoracic aortic aneurysm. Atherosclerotic calcifications of the arch. Three vessel coronary atherosclerosis. Right chest power port terminates at the cavoatrial junction. Mediastinum/Nodes: Known distal esophageal mass is not well visualized on unenhanced CT. Small mediastinal nodes, including a 9 mm short axis lower esophageal node (series 13/image 165) and 8 mm short axis low right paratracheal node (series 13/image 36). Visualized thyroid is unremarkable. Lungs/Pleura: Mild centrilobular emphysematous changes. Mild linear scarring/atelectasis in the right lower lobe. No focal consolidation. No suspicious pulmonary nodules, noting motion degradation. No pleural effusion or pneumothorax. Musculoskeletal: Healing anterior 2nd through 7th rib fractures bilaterally. Healing sternal fracture. Thoracic spine is within normal limits. CT ABDOMEN PELVIS FINDINGS Hepatobiliary: Large central hepatic mass measuring 8.3 cm, poorly evaluated on the current study, better evaluated on prior CT. Gallbladder is unremarkable. No intrahepatic or extrahepatic ductal dilatation. Pancreas: Parenchymal calcifications in the head/uncinate process, reflecting sequela chronic pancreatitis. Trace peripancreatic fluid along the pancreatic tail (series 13/image 209), reflecting acute inflammatory change. 4.3 cm fluid density lesion in the pancreatic head (series 13/image 221), non FDG avid on recent PET-CT, likely reflecting a pseudocyst. Spleen: Within normal limits. Adrenals/Urinary Tract: Adrenal glands are within normal limits.  Kidneys are within normal limits.  No hydronephrosis. Bladder is within normal limits. Stomach/Bowel: Stomach is within normal limits. No evidence of bowel obstruction. Normal appendix (series 13/image 278). Mild sigmoid diverticulosis, without evidence of diverticulitis. Vascular/Lymphatic: No evidence of abdominal aortic aneurysm. Atherosclerotic calcifications of the abdominal aorta and branch vessels. Upper abdominal lymphadenopathy, including a dominant 2.7 cm short axis gastrohepatic node (series 13/image 193). Additional small calcified para-aortic nodes, possibly reactive. Reproductive: Prostate is unremarkable. Other: Mild presacral stranding.  No abdominopelvic ascites. Musculoskeletal: Mild degenerative changes of the lumbar spine. Status post PLIF at L5-S1. IMPRESSION: No interval change from recent PET-CT.  No acute findings. Known distal esophageal mass (corresponding to the patient's primary cancer) is not well visualized on unenhanced CT. Large hepatic metastasis.  Upper abdominal nodal metastases. Acute on chronic pancreatitis with 4.3 cm pseudocyst in the pancreatic head. Additional stable ancillary findings as above. Aortic Atherosclerosis (ICD10-I70.0) and Emphysema (ICD10-J43.9). Electronically Signed   By: Julian Hy M.D.   On: 03/11/2022 21:44   CT Head Wo Contrast  Result  Date: 03/11/2022 CLINICAL DATA:  Altered mental status EXAM: CT HEAD WITHOUT CONTRAST TECHNIQUE: Contiguous axial images were obtained from the base of the skull through the vertex without intravenous contrast. RADIATION DOSE REDUCTION: This exam was performed according to the departmental dose-optimization program which includes automated exposure control, adjustment of the mA and/or kV according to patient size and/or use of iterative reconstruction technique. 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 No acute intracranial hemorrhage. No  midline shift or other mass effect. Vascular: No abnormal hyperdensity of the major intracranial arteries or dural venous sinuses. No intracranial atherosclerosis. Skull: The visualized skull base, calvarium and extracranial soft tissues are normal. Sinuses/Orbits: No fluid levels or advanced mucosal thickening of the visualized paranasal sinuses. No mastoid or middle ear effusion. The orbits are normal. IMPRESSION: 1. No acute intracranial abnormality. 2. 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 Electronically Signed   By: Ulyses Jarred M.D.   On: 03/11/2022 21:39   DG Chest Port 1 View  Result Date: 03/11/2022 CLINICAL DATA:  Questionable sepsis. EXAM: PORTABLE CHEST 1 VIEW COMPARISON:  Chest x-ray 02/27/2022 FINDINGS: Right chest port catheter tip projects over the SVC. This is new from prior. The heart is enlarged, unchanged. There is no focal lung infiltrate, pleural effusion or pneumothorax. There are atherosclerotic calcifications of the aorta. No acute fractures are seen. IMPRESSION: 1. New right-sided chest port with distal catheter tip projecting over the SVC. 2. Stable cardiomegaly. 3. The lungs are clear. Electronically Signed   By: Ronney Asters M.D.   On: 03/11/2022 19:59    Labs:  CBC: Recent Labs    03/12/22 0349 03/13/22 0355 03/14/22 0415 03/15/22 0356  WBC 4.6 5.5 5.9 6.7  HGB 8.3* 8.4* 9.0* 9.0*  HCT 27.3* 27.7* 29.3* 29.6*  PLT 129* 141* 164 205    COAGS: Recent Labs    01/31/22 1340 01/31/22 2041 02/01/22 0414 02/01/22 1625 02/02/22 0101 02/11/22 1510 02/13/22 0922 02/27/22 1710 03/11/22 2009  INR 1.5*  --   --   --   --  1.5*  --  1.0 1.6*  APTT 147*   < > 33 60* 77*  --  45*  --   --    < > = values in this interval not displayed.    BMP: Recent Labs    03/12/22 0349 03/13/22 0355 03/14/22 0415 03/15/22 0356  NA 140 140 139 141  K 3.8 3.9 3.7 3.6  CL 110 109 106 107  CO2 '25 24 24 25  '$ GLUCOSE 125* 92 98 89  BUN  '15 12 11 9  '$ CALCIUM 8.4* 8.6* 8.6* 8.5*  CREATININE 0.95 0.92 0.83 0.72  GFRNONAA >60 >60 >60 >60    LIVER FUNCTION TESTS: Recent Labs    03/11/22 1930 03/12/22 0349 03/13/22 0355 03/14/22 0415  BILITOT 0.6 0.6 0.5 0.5  AST 36 '30 28 23  '$ ALT '29 27 25 22  '$ ALKPHOS 241* 203* 202* 228*  PROT 7.3 6.6 6.3* 6.4*  ALBUMIN 2.9* 2.5* 2.4* 2.4*    Assessment and Plan: Pt known to IR service from right thoracentesis in 2013 and left thoracentesis on 02/11/2022 ,left lobe liver bx on 02/15/22, and port a cath placement on 03/07/22. He has a hx of metastatic adenocarcinoma of likely esophageal primary, MI/cardiac arrest 01/31/22, anemia, bilat LE DVT's/PE, PAD with prior left AKA, RA, pleural effusions, COPD  and was [facility] to [facility] on [facility]11/22 with e  coli bacteremia. He is on rocephin. Latest CT C/A/P on 03/11/22 revealed:    No interval change from recent PET-CT.  No acute findings.   Known distal esophageal mass (corresponding to the patient's primary cancer) is not well visualized on unenhanced CT.   Large hepatic metastasis.  Upper abdominal nodal metastases.   Acute on chronic pancreatitis with 4.3 cm pseudocyst in the pancreatic head.   Additional stable ancillary findings as above.   Aortic Atherosclerosis (ICD10-I70.0) and Emphysema    He has been off eliquis and plavix in anticipation of undegoing liver lesion aspiration to rule out associated infection of necrotic liver mass(prev + bx).Imaging studies have been reviewed by Dr. Vernard Gambles. Plan at this time is to initially proceed with aspiration of liver lesion on 9/22 and await culture results ,hoping to avoid placing drain into a necrotic malignant lesion.    Electronically Signed: D. Rowe Robert, PA-C 03/15/2022, 3:00 PM   I spent a total of 25 Minutes at the the patient's bedside AND on the patient's hospital floor or unit, greater than 50% of which was counseling/coordinating care for image guided aspiration of liver  lesion    Patient ID: [name] [name], male   DOB: 1961/02/02, 61 y.o.   MRN: [id number]

## 2022-03-15 NOTE — Plan of Care (Signed)

## 2022-03-15 NOTE — Consult Note (Addendum)
[facility]                                                                                        Patient Identification: Patient Name: [name] [name] MRN: [id number] [place] Date: 03/11/2022  7:07 PM Today's Date: 03/15/2022 Reason for consult: Bacteremia/questionable liver abscess Requesting provider: Dr. Verlon Au  Principal Problem:   SIRS (systemic inflammatory response syndrome) ([place]) Active Problems:   Normocytic anemia   Coronary artery disease involving native coronary artery of native heart with unstable angina pectoris ([place])   Hypokalemia   PAD (peripheral artery disease) ([place])   Adenocarcinoma of unknown origin ([place])   Acute metabolic encephalopathy   Acute urinary retention   Antibiotics:  Metronidazole 9/16-c Cefepime 9/16, ceftriaxone 9/17-9/19, cefazolin 9/19- Vancomycin 9/16  Lines/Hardware: Right chest port  Assessment 61 year old male with PMH of S/p cardiac arrest due to VT/inferior STEMI/CAD, DDD, GERD, CVA, h/o COVID 19, HLD, HTN, Lupus/RA not on tx,  PVD s/p Left AKA status post femoral popliteal bypass graft, PE/DVT, TLIF with pedicle screw fixation with recent diagnosis of metastatic adenocarcinoma (presumed upper GI/esophageal mass primary with hepatic adrenal and nodal metastasis)  # E coli bacteremia  - No frank GI and new GU symptoms, UA unremarkable 9/17 urine cx negative  but had acute urinary retention that required foley's? Possible source   - Recently placed Rt chest port with no concerning symptoms or signs  - Planned for Liver aspiration/biopsy noted due to concerns for infected necrotic hepatic metastasis per Oncology   Recommendations  Continue IV cefazolin and metronidazole as is  Please send  samples of aspiraton/biopsy for aerobic/anaerobic cultures and path as appropriate  Monitor CBC and BMP Will follow intermittently pending IR  aspiration/cultures. Please call with questions.   Rest of the management as per the primary team.  Thank you for the consult  Rosiland Oz, MD Infectious Disease Physician [facility] [address] [place], [place] [address] Phone: [phone]  Fax: [phone]  __________________________________________________________________________________________________________ HPI and Hospital Course: 61 year old male with PMH of status post cardiac arrest due to VT/inferior STEMI/CAD, DDD, GERD, CVA, h/o COVID 57, HLD, HTN, Lupus/RA not on tx,  PVD s/p Left AKA status post femoral popliteal bypass graft, PE/DVT, TLIF with pedicle screw fixation with recent diagnosis of metastatic adenocarcinoma (presumed upper GI/esophageal mass primary with hepatic adrenal and nodal metastasis) who presented to the ED on 9/16 from home after being found in bed with altered mental status and not able to sit up by roommate. Reported right upper abdomen as well as lower back pain chronic on admit.   Febrile with tachycardia, tachypnea on presentation.  Received LR and Tylenol given by EMS IVF and broad-spectrum antibiotics started for concern of sepsis  Labs remarkable for K3.2, albumin 2.9, lactic acid 1.5, WBC 5.3  Patient accompanied by friend.  Complains of nausea but denies any vomiting or abdominal pain or diarrhea.  Denies cough, chest pain or shortness of breath.  Denies any burning urination or increased frequency of urination than his usual baseline.  Denies any soreness or pain or  tenderness at the right chest port.  Denies any peripheral joint pain or rashes.  Chroic back pain with no worsening. Appetite is okay, denies any weight loss.  Patient and his friend tells me he has not been started on any kind of treatment including chemotherapy and radiation therapy for his malignancy.  He quit smoking a month ago, denies alcohol and IVDU  ROS: All  systems reviewed with pertinent positive and negative as listed above  Past Medical History:  Diagnosis Date   Abnormal CT scan, gastrointestinal tract    Acute respiratory failure ([place]) 01/31/2022   Acute respiratory failure with hypoxemia (H[facility]) 02/11/2022   Anemia    Anemia 08/06/2012   Aortic valve mass 12/30/2015   Bilateral shoulder pain 08/20/2012   Bite wound of left hand 09/27/2021   Cardiogenic shock ([place]) 01/31/2022   Community acquired pneumonia 11/07/2011   Critical limb ischemia with history of revascularization of same extremity ([place]) 04/28/2020   DDD (degenerative disc disease), lumbosacral     and grade 2 slip   DVT (deep venous thrombosis) ([place]) 01/31/2022   GERD (gastroesophageal reflux disease)    H/O: stroke 01/31/2022   Heart murmur    History of anemia    no current med.   History of COVID-19 05/10/2019   History of critical lower limb ischemia 02/19/2019   Hyperglycemia 01/31/2022   Hypertension    states is borderline on medication; has been on med. x 5-6 yr.   Hypokalemia 05/30/2018   Impingement syndrome of shoulder region 10/2014   left   Insomnia 06/01/2015   Ischemic ulcer of toe of left foot (H[facility])    Lactic acidosis 01/31/2022   Left shoulder pain 12/16/2015   Liver mass    Low back pain without sciatica 10/29/2009   Lupus ([place])    Multiple fractures of ribs, bilateral, due to CPR trauma 01/31/2022   Onychomycosis 01/28/2021   Paronychia of great toe 01/28/2021   Peripheral vascular disease ([place])    Pneumonia    Pressure injury of skin 01/31/2022   Pulmonary contusion 01/31/2022   RA (rheumatoid arthritis) ([place])    Rotator cuff tear 11/04/2014   STEMI (ST elevation myocardial infarction) ([place]) 01/31/2022   Sternal fracture 01/31/2022   Transaminitis 12/16/2015   Past Surgical History:  Procedure Laterality Date   ABDOMINAL AORTAGRAM  02/20/2019   ABDOMINAL AORTOGRAM W/LOWER EXTREMITY N/A 02/20/2019   Procedure: ABDOMINAL AORTOGRAM W/LOWER EXTREMITY;  Surgeon: Marty Heck, MD;  Location: [facility];  Service: Cardiovascular;  Laterality: N/A;   ACHILLES TENDON SURGERY Bilateral    AMPUTATION Left 05/11/2020   Procedure: LEFT ABOVE KNEE AMPUTATION;  Surgeon: Angelia Mould, MD;  Location: [facility];  Service: Vascular;  Laterality: Left;   ENDARTERECTOMY POPLITEAL Left 05/01/2020   Procedure: ENDARTERECTOMY POPLITEAL;  Surgeon: Waynetta Sandy, MD;  Location: [facility];  Service: Vascular;  Laterality: Left;   FASCIOTOMY Left 05/01/2020   Procedure: FASCIOTOMY;  Surgeon: Waynetta Sandy, MD;  Location: [facility];  Service: Vascular;  Laterality: Left;   FEMORAL-POPLITEAL BYPASS GRAFT Left 02/26/2019   Procedure: BYPASS GRAFT FEMORAL-POPLITEAL ARTERY LEFT LEG USING 79m PROPATEN GRAFT;  Surgeon: BSerafina Mitchell MD;  Location[facility];  Service: Vascular;  Laterality: Left;   INTRAOPERATIVE ARTERIOGRAM Left 01/22/2020   Procedure: INTRA OPERATIVE ARTERIOGRAM with administration of thrombolyics in Left femoral to popliteal bypass.;  Surgeon: CMarty Heck MD;  [facility]  Service: Vascular;  Laterality: Left;   IR  IMAGING GUIDED PORT INSERTION  03/07/2022   LEFT HEART CATH AND CORONARY ANGIOGRAPHY N/A 03/15/2017   Procedure: LEFT HEART CATH AND CORONARY ANGIOGRAPHY;  Surgeon: Nelva Bush, MD;  Location: [facility];  Service: Cardiovascular;  Laterality: N/A;   LEFT HEART CATH AND CORONARY ANGIOGRAPHY N/A 02/06/2022   Procedure: LEFT HEART CATH AND CORONARY ANGIOGRAPHY;  Surgeon: Martinique, Peter M, MD;  Location: [facility];  Service: Cardiovascular;  Laterality: N/A;   LOWER EXTREMITY INTERVENTION Left 04/29/2020   Procedure: LOWER EXTREMITY INTERVENTION- LYSIS;  Surgeon: Marty Heck, MD;  Location: [facility];  Service: Cardiovascular;  Laterality: Left;   OSTEOTOMY AND ULNAR SHORTENING Right 07/22/2002   PATCH ANGIOPLASTY Left 05/01/2020   Procedure: PATCH ANGIOPLASTY USING HEMASHIELD  PLATINUM FINESSE PATCH;  Surgeon: Waynetta Sandy, MD;  Location: [facility];  Service: Vascular;  Laterality: Left;   PERIPHERAL VASCULAR ATHERECTOMY  01/23/2020   Procedure: PERIPHERAL VASCULAR ATHERECTOMY;  Surgeon: Marty Heck, MD;  Location: [facility];  Service: Cardiovascular;;   PERIPHERAL VASCULAR BALLOON ANGIOPLASTY  01/23/2020   Procedure: PERIPHERAL VASCULAR BALLOON ANGIOPLASTY;  Surgeon: Marty Heck, MD;  Location: [facility];  Service: Cardiovascular;;   PERIPHERAL VASCULAR INTERVENTION Left 04/30/2020   Procedure: PERIPHERAL VASCULAR INTERVENTION;  Surgeon: Cherre Robins, MD;  Location: [facility];  Service: Cardiovascular;  Laterality: Left;   PERIPHERAL VASCULAR THROMBECTOMY N/A 01/23/2020   Procedure: lysis recheck;  Surgeon: Marty Heck, MD;  Location: [facility];  Service: Cardiovascular;  Laterality: N/A;  + [place]    PERIPHERAL VASCULAR THROMBECTOMY N/A 04/30/2020   Procedure: Lysis Recheck;  Surgeon: Cherre Robins, MD;  Location: [facility];  Service: Cardiovascular;  Laterality: N/A;   SHOULDER ARTHROSCOPY WITH BICEPSTENOTOMY Right 03/11/2015   Procedure: SHOULDER ARTHROSCOPY WITH BICEPSTENOTOMY;  Surgeon: Ninetta Lights, MD;  Location: [facility];  Service: Orthopedics;  Laterality: Right;   SHOULDER ARTHROSCOPY WITH DISTAL CLAVICLE RESECTION Left 11/19/2014   Procedure: SHOULDER ARTHROSCOPY WITH DISTAL CLAVICLE RESECTION;  Surgeon: Kathryne Hitch, MD;  Location: [facility];  Service: Orthopedics;  Laterality: Left;   SHOULDER ARTHROSCOPY WITH ROTATOR CUFF REPAIR AND SUBACROMIAL DECOMPRESSION Left 11/19/2014   Procedure: LEFT SHOULDER ARTHROSCOPY, DEBRIDEMENT DISTAL CLAVICLE EXCISION, ACROMIOPLASTY WITH ROTATOR CUFF REPAIR ;  Surgeon: Kathryne Hitch, MD;  Location: [facility];  Service: Orthopedics;  Laterality: Left;   THROMBECTOMY OF BYPASS GRAFT FEMORAL-  POPLITEAL ARTERY Left 05/01/2020   Procedure: THROMBECTOMY OF LOWER EXTREMITY;  Surgeon: Waynetta Sandy, MD;  Location: [facility];  Service: Vascular;  Laterality: Left;   TRANSFORAMINAL LUMBAR INTERBODY FUSION (TLIF) WITH PEDICLE SCREW FIXATION 1 LEVEL N/A 01/03/2018   Procedure: TRANSFORAMINAL LUMBAR INTERBODY FUSION (TLIF) LUMBAR FIVE-SACRAL ONE;  Surgeon: Melina Schools, MD;  Location: [facility];  Service: Orthopedics;  Laterality: N/A;   ULTRASOUND GUIDANCE FOR VASCULAR ACCESS Right 01/22/2020   Procedure: ULTRASOUND GUIDANCE FOR VASCULAR ACCESS Right Common Femoral Artery.;  Surgeon: Marty Heck, MD;  Location: [facility];  Service: Vascular;  Laterality: Right;   VIDEO ASSISTED THORACOSCOPY (VATS)/DECORTICATION Right 11/21/2011   drainage of empyema    Scheduled Meds:  atorvastatin  80 mg Oral q1800   Chlorhexidine Gluconate Cloth  6 each Topical Daily   diclofenac  1 patch Transdermal BID   DULoxetine  30 mg Oral Daily   enoxaparin (LOVENOX) injection  1 mg/kg Subcutaneous BID   metroNIDAZOLE  500 mg  Oral Q12H   sodium chloride flush  10-40 mL Intracatheter Q12H   sodium chloride flush  3 mL Intravenous Q12H   Continuous Infusions:   ceFAZolin (ANCEF) IV 2 g (03/15/22 0601)   PRN Meds:.acetaminophen **OR** acetaminophen, gabapentin, oxyCODONE-acetaminophen **AND** oxyCODONE, prochlorperazine, senna-docusate  Allergies  Allergen Reactions   Dilaudid [Hydromorphone Hcl] Rash   Ramipril Rash and Other (See Comments)    Per patient on R arm and leg only; no angioedema   Tramadol Itching and Rash   Social History   Socioeconomic History   Marital status: Single    Spouse name: Not on file   Number of children: 0   Years of education: Not on file   Highest education level: Not on file  Occupational History   Occupation: Disabled    Comment: 2/2 RA  Tobacco Use   Smoking status: Former    Packs/day: 0.25    Years: 20.00    Total pack years: 5.00    Types:  Cigarettes    Quit date: 02/19/2019    Years since quitting: 3.0   Smokeless tobacco: Never   Tobacco comments:    Patient stated he smokes a couple day and is attempting to quit smoking  Vaping Use   Vaping Use: Never used  Substance and Sexual Activity   Alcohol use: No    Alcohol/week: 0.0 standard drinks of alcohol    Comment: sober 1998   Drug use: No   Sexual activity: Not on file  Other Topics Concern   Not on file  Social History Narrative   On disability since 1992, used to work with city of [place]. Quit drinking in 1990.      Current Social History 11/05/2019        Patient lives by himself most of the time. Sometimes girlfriend's son stays with him in a one level home. There are 3 steps with handrail up to the entrance the patient uses.       Patient's method of transportation is personal truck. This was recently vandalized and patient doesn't have funds to repair at this time.      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, BSN, RN-BC    Social Determinants of Health   Financial Resource Strain: Not on file  Food Insecurity: No Food Insecurity (01/27/2022)   Hunger Vital Sign    Worried About Running Out of Food in the Last Year: Never true    [place] in the Last Year: Never true  Transportation Needs: No Transportation Needs (03/01/2022)   PRAPARE - Hydrologist (Medical): No    Lack of Transportation (Non-Medical): No  Physical Activity: Not on file  Stress: Not on file  Social Connections: Not on file   Intimate Partner Violence: Not on file   Family History  Problem Relation Age of Onset   Heart attack Father 27  Alcohol abuse Father    Aneurysm Mother    Stroke Neg Hx    Cancer Neg Hx     Vitals BP (!) 175/99 (BP Location: Right Arm)   Pulse (!) 105   Temp 98 F (36.7 C) (Oral)   Resp 16   Ht '5\' 7"'$  (1.702 m)   Wt 71.4 kg   SpO2 93%   BMI 24.64 kg/m    Physical Exam Constitutional: Sitting up in the bed and appears comfortable    Comments:   Cardiovascular:     Rate and Rhythm: Normal rate and regular rhythm.     Heart sounds:  Pulmonary:     Effort: Pulmonary effort is normal on room air    Comments:   Abdominal:     Palpations: Abdomen is soft    Tenderness: Nondistended and nontender  Musculoskeletal:        General: No swelling or tenderness peripheral joints, Left AKA with no acute concerns   Skin:    Comments: No obvious rashes, tattoos present. Rt chest port with no surrounding erythema/swelling/tenderness and fluctuance   Neurological:     General: grossly non focal, awake, alert and oriented   Psychiatric:        Mood and Affect: Mood normal.    Pertinent Microbiology Results for orders placed or performed during the hospital encounter of 03/11/22  Blood Culture (routine x 2)     Status: Abnormal   Collection Time: 03/11/22  7:30 PM   Specimen: BLOOD  Result Value Ref Range Status   Specimen Description   Final    BLOOD RIGHT ANTECUBITAL Performed at [facility] [address], [place], [place] [address]    Special Requests   Final    BOTTLES DRAWN AEROBIC AND ANAEROBIC Blood Culture results may not be optimal due to an excessive volume of blood received in culture bottles Performed at [facility] [address], [place], [place] [address]    Culture  Setup Time   Final    GRAM NEGATIVE RODS IN BOTH AEROBIC AND ANAEROBIC BOTTLES CRITICAL RESULT CALLED TO, READ BACK BY AND VERIFIED WITH: N.  [place], AT 1412 03/12/22 D. VANHOOK Performed at [facility], [place] [address], [place], [place] [address]    Culture ESCHERICHIA COLI (A)  Final   Report Status 03/14/2022 FINAL  Final   Organism ID, Bacteria ESCHERICHIA COLI  Final      Susceptibility   Escherichia coli - MIC*    AMPICILLIN >=32 RESISTANT Resistant     CEFAZOLIN <=4 SENSITIVE Sensitive     CEFEPIME <=0.12 SENSITIVE Sensitive     CEFTAZIDIME <=1 SENSITIVE Sensitive     CEFTRIAXONE <=0.25 SENSITIVE Sensitive     CIPROFLOXACIN <=0.25 SENSITIVE Sensitive     GENTAMICIN >=16 RESISTANT Resistant     IMIPENEM <=0.25 SENSITIVE Sensitive     TRIMETH/SULFA >=320 RESISTANT Resistant     AMPICILLIN/SULBACTAM 16 INTERMEDIATE Intermediate     PIP/TAZO <=4 SENSITIVE Sensitive     * ESCHERICHIA COLI  Blood Culture ID Panel (Reflexed)     Status: Abnormal   Collection Time: 03/11/22  7:30 PM  Result Value Ref Range Status   Enterococcus faecalis NOT DETECTED NOT DETECTED Final   Enterococcus Faecium NOT DETECTED NOT DETECTED Final   Listeria monocytogenes NOT DETECTED NOT DETECTED Final   Staphylococcus species NOT DETECTED NOT DETECTED Final   Staphylococcus aureus (BCID) NOT DETECTED NOT DETECTED Final   Staphylococcus epidermidis  NOT DETECTED NOT DETECTED Final   Staphylococcus lugdunensis NOT DETECTED NOT DETECTED Final   Streptococcus species NOT DETECTED NOT DETECTED Final   Streptococcus agalactiae NOT DETECTED NOT DETECTED Final   Streptococcus pneumoniae NOT DETECTED NOT DETECTED Final   Streptococcus pyogenes NOT DETECTED NOT DETECTED Final   A.calcoaceticus-baumannii NOT DETECTED NOT DETECTED Final   Bacteroides fragilis NOT DETECTED NOT DETECTED Final   Enterobacterales DETECTED (A) NOT DETECTED Final    Comment: Enterobacterales represent a large order of gram negative bacteria, not a single organism. CRITICAL RESULT CALLED TO, READ BACK BY AND VERIFIED WITH: N. [place], AT 1412 03/12/22 D.  VANHOOK    Enterobacter cloacae complex NOT DETECTED NOT DETECTED Final   Escherichia coli DETECTED (A) NOT DETECTED Final    Comment: CRITICAL RESULT CALLED TO, READ BACK BY AND VERIFIED WITH: N. [place], AT 1412 03/12/22 D. VANHOOK    Klebsiella aerogenes NOT DETECTED NOT DETECTED Final   Klebsiella oxytoca NOT DETECTED NOT DETECTED Final   Klebsiella pneumoniae NOT DETECTED NOT DETECTED Final   Proteus species NOT DETECTED NOT DETECTED Final   Salmonella species NOT DETECTED NOT DETECTED Final   Serratia marcescens NOT DETECTED NOT DETECTED Final   Haemophilus influenzae NOT DETECTED NOT DETECTED Final   Neisseria meningitidis NOT DETECTED NOT DETECTED Final   Pseudomonas aeruginosa NOT DETECTED NOT DETECTED Final   Stenotrophomonas maltophilia NOT DETECTED NOT DETECTED Final   Candida albicans NOT DETECTED NOT DETECTED Final   Candida auris NOT DETECTED NOT DETECTED Final   Candida glabrata NOT DETECTED NOT DETECTED Final   Candida krusei NOT DETECTED NOT DETECTED Final   Candida parapsilosis NOT DETECTED NOT DETECTED Final   Candida tropicalis NOT DETECTED NOT DETECTED Final   Cryptococcus neoformans/gattii NOT DETECTED NOT DETECTED Final   CTX-M ESBL NOT DETECTED NOT DETECTED Final   Carbapenem resistance IMP NOT DETECTED NOT DETECTED Final   Carbapenem resistance KPC NOT DETECTED NOT DETECTED Final   Carbapenem resistance NDM NOT DETECTED NOT DETECTED Final   Carbapenem resist OXA 48 LIKE NOT DETECTED NOT DETECTED Final   Carbapenem resistance VIM NOT DETECTED NOT DETECTED Final    Comment: Performed at [facility], [address] [place], [place] [address]  Blood Culture (routine x 2)     Status: Abnormal   Collection Time: 03/11/22  7:32 PM   Specimen: BLOOD  Result Value Ref Range Status   Specimen Description   Final    BLOOD BLOOD LEFT FOREARM Performed at [facility] [address] [place], [place] [address]    Special Requests    Final    BOTTLES DRAWN AEROBIC AND ANAEROBIC Blood Culture results may not be optimal due to an excessive volume of blood received in culture bottles Performed at [facility] [address], [place], [place] [address]    Culture  Setup Time   Final    GRAM NEGATIVE RODS IN BOTH AEROBIC AND ANAEROBIC BOTTLES CRITICAL VALUE NOTED.  VALUE IS CONSISTENT WITH PREVIOUSLY REPORTED AND CALLED VALUE. Performed at [facility], [place] [address] [place],  [address]    Culture ESCHERICHIA COLI (A)  Final   Report Status 03/14/2022 FINAL  Final   Organism ID, Bacteria ESCHERICHIA COLI  Final      Susceptibility   Escherichia coli - MIC*    AMPICILLIN >=32 RESISTANT Resistant     CEFAZOLIN <=4 SENSITIVE Sensitive     CEFEPIME <=0.12 SENSITIVE Sensitive  CEFTAZIDIME <=1 SENSITIVE Sensitive     CEFTRIAXONE <=0.25 SENSITIVE Sensitive     CIPROFLOXACIN <=0.25 SENSITIVE Sensitive     GENTAMICIN >=16 RESISTANT Resistant     IMIPENEM <=0.25 SENSITIVE Sensitive     TRIMETH/SULFA >=320 RESISTANT Resistant     AMPICILLIN/SULBACTAM 16 INTERMEDIATE Intermediate     PIP/TAZO <=4 SENSITIVE Sensitive     * ESCHERICHIA COLI  Urine Culture     Status: None   Collection Time: 03/12/22  1:30 AM   Specimen: In/Out Cath Urine  Result Value Ref Range Status   Specimen Description   Final    IN/OUT CATH URINE Performed at [facility] [address] [place], [place] [address]    Special Requests   Final    NONE Performed at [facility], [place] [address], [place], [place] [address]    Culture   Final    NO GROWTH Performed at [facility], [place] [address], [place], [place] [address]    Report Status 03/13/2022 FINAL  Final  Resp Panel by RT-PCR (Flu A&B, Covid) Anterior Nasal Swab     Status: None   Collection Time: 03/12/22  1:32 AM   Specimen: Anterior Nasal Swab  Result Value Ref Range Status   SARS Coronavirus 2 by RT PCR  NEGATIVE NEGATIVE Final    Comment: (NOTE) SARS-CoV-2 target nucleic acids are NOT DETECTED.  The SARS-CoV-2 RNA is generally detectable in upper respiratory specimens during the acute phase of infection. The lowest concentration of SARS-CoV-2 viral copies this assay can detect is 138 copies/mL. A negative result does not preclude SARS-Cov-2 infection and should not be used as the sole basis for treatment or other patient management decisions. A negative result may occur with  improper specimen collection/handling, submission of specimen other than nasopharyngeal swab, presence of viral mutation(s) within the areas targeted by this assay, and inadequate number of viral copies(<138 copies/mL). A negative result must be combined with clinical observations, patient history, and epidemiological information. The expected result is Negative.  Fact Sheet for Patients:  EntrepreneurPulse.com.au  Fact Sheet for Healthcare Providers:  IncredibleEmployment.be  This test is no t yet approved or cleared by the [place] FDA and  has been authorized for detection and/or diagnosis of SARS-CoV-2 by FDA under an Emergency Use Authorization (EUA). This EUA will remain  in effect (meaning this test can be used) for the duration of the COVID-19 declaration under Section 564(b)(1) of the Act, 21 U.S.C.section 360bbb-3(b)(1), unless the authorization is terminated  or revoked sooner.       Influenza A by PCR NEGATIVE NEGATIVE Final   Influenza B by PCR NEGATIVE NEGATIVE Final    Comment: (NOTE) The Xpert Xpress SARS-CoV-2/FLU/RSV plus assay is intended as an aid in the diagnosis of influenza from Nasopharyngeal swab specimens and should not be used as a sole basis for treatment. Nasal washings and aspirates are unacceptable for Xpert Xpress SARS-CoV-2/FLU/RSV testing.  Fact Sheet for Patients: EntrepreneurPulse.com.au  Fact Sheet for  Healthcare Providers: IncredibleEmployment.be  This test is not yet approved or cleared by the [place] FDA and has been authorized for detection and/or diagnosis of SARS-CoV-2 by FDA under an Emergency Use Authorization (EUA). This EUA will remain in effect (meaning this test can be used) for the duration of the COVID-19 declaration under Section 564(b)(1) of the Act, 21 U.S.C. section 360bbb-3(b)(1), unless the authorization is terminated or revoked.  Performed at [facility], [place] [address]  [place], [place] [address]     Pertinent Lab seen by me:    Latest Ref Rng & Units 03/15/2022    3:56 AM 03/14/2022    4:15 AM 03/13/2022    3:55 AM  CBC  WBC 4.0 - 10.5 K/uL 6.7  5.9  5.5   Hemoglobin 13.0 - 17.0 g/dL 9.0  9.0  8.4   Hematocrit 39.0 - 52.0 % 29.6  29.3  27.7   Platelets 150 - 400 K/uL 205  164  141       Latest Ref Rng & Units 03/15/2022    3:56 AM 03/14/2022    4:15 AM 03/13/2022    3:55 AM  CMP  Glucose 70 - 99 mg/dL 89  98  92   BUN 8 - 23 mg/dL '9  11  12   '$ Creatinine 0.61 - 1.24 mg/dL 0.72  0.83  0.92   Sodium 135 - 145 mmol/L 141  139  140   Potassium 3.5 - 5.1 mmol/L 3.6  3.7  3.9   Chloride 98 - 111 mmol/L 107  106  109   CO2 22 - 32 mmol/L '25  24  24   '$ Calcium 8.9 - 10.3 mg/dL 8.5  8.6  8.6   Total Protein 6.5 - 8.1 g/dL  6.4  6.3   Total Bilirubin 0.3 - 1.2 mg/dL  0.5  0.5   Alkaline Phos 38 - 126 U/L  228  202   AST 15 - 41 U/L  23  28   ALT 0 - 44 U/L  22  25      Pertinent Imagings/Other Imagings Plain films and CT images have been personally visualized and interpreted; radiology reports have been reviewed. Decision making incorporated into the Impression / Recommendations.  CT CHEST ABDOMEN PELVIS WO CONTRAST  Result Date: 03/11/2022 CLINICAL DATA:  Altered mental status. While the reported history is newly diagnosed pancreatic cancer, when correlating with recent PET-CT, the patient has metastatic distal  esophageal cancer. EXAM: CT CHEST, ABDOMEN AND PELVIS WITHOUT CONTRAST TECHNIQUE: Multidetector CT imaging of the chest, abdomen and pelvis was performed following the standard protocol without IV contrast. RADIATION DOSE REDUCTION: This exam was performed according to the departmental dose-optimization program which includes automated exposure control, adjustment of the mA and/or kV according to patient size and/or use of iterative reconstruction technique. COMPARISON:  PET-CT dated 03/08/2022. CTA chest dated 02/27/2022. MRI abdomen dated 02/11/2022. CT abdomen/pelvis dated 02/11/2022. FINDINGS: CT CHEST FINDINGS Cardiovascular: The heart is normal in size. Trace pericardial fluid. No evidence thoracic aortic aneurysm. Atherosclerotic calcifications of the arch. Three vessel coronary atherosclerosis. Right chest power port terminates at the cavoatrial junction. Mediastinum/Nodes: Known distal esophageal mass is not well visualized on unenhanced CT. Small mediastinal nodes, including a 9 mm short axis lower esophageal node (series 13/image 165) and 8 mm short axis low right paratracheal node (series 13/image 36). Visualized thyroid is unremarkable. Lungs/Pleura: Mild centrilobular emphysematous changes. Mild linear scarring/atelectasis in the right lower lobe. No focal consolidation. No suspicious pulmonary nodules, noting motion degradation. No pleural effusion or pneumothorax. Musculoskeletal: Healing anterior 2nd through 7th rib fractures bilaterally. Healing sternal fracture. Thoracic spine is within normal limits. CT ABDOMEN PELVIS FINDINGS Hepatobiliary: Large central hepatic mass measuring 8.3 cm, poorly evaluated on the current study, better evaluated on prior CT. Gallbladder is unremarkable. No intrahepatic or extrahepatic ductal dilatation. Pancreas: Parenchymal calcifications in the head/uncinate process, reflecting sequela chronic pancreatitis. Trace peripancreatic fluid along the pancreatic tail  (series 13/image 209),  reflecting acute inflammatory change. 4.3 cm fluid density lesion in the pancreatic head (series 13/image 221), non FDG avid on recent PET-CT, likely reflecting a pseudocyst. Spleen: Within normal limits. Adrenals/Urinary Tract: Adrenal glands are within normal limits. Kidneys are within normal limits.  No hydronephrosis. Bladder is within normal limits. Stomach/Bowel: Stomach is within normal limits. No evidence of bowel obstruction. Normal appendix (series 13/image 278). Mild sigmoid diverticulosis, without evidence of diverticulitis. Vascular/Lymphatic: No evidence of abdominal aortic aneurysm. Atherosclerotic calcifications of the abdominal aorta and branch vessels. Upper abdominal lymphadenopathy, including a dominant 2.7 cm short axis gastrohepatic node (series 13/image 193). Additional small calcified para-aortic nodes, possibly reactive. Reproductive: Prostate is unremarkable. Other: Mild presacral stranding.  No abdominopelvic ascites. Musculoskeletal: Mild degenerative changes of the lumbar spine. Status post PLIF at L5-S1. IMPRESSION: No interval change from recent PET-CT.  No acute findings. Known distal esophageal mass (corresponding to the patient's primary cancer) is not well visualized on unenhanced CT. Large hepatic metastasis.  Upper abdominal nodal metastases. Acute on chronic pancreatitis with 4.3 cm pseudocyst in the pancreatic head. Additional stable ancillary findings as above. Aortic Atherosclerosis (ICD10-I70.0) and Emphysema (ICD10-J43.9). Electronically Signed   By: Julian Hy M.D.   On: 03/11/2022 21:44   CT Head Wo Contrast  Result Date: 03/11/2022 CLINICAL DATA:  Altered mental status EXAM: CT HEAD WITHOUT CONTRAST TECHNIQUE: Contiguous axial images were obtained from the base of the skull through the vertex without intravenous contrast. RADIATION DOSE REDUCTION: This exam was performed according to the departmental dose-optimization program which  includes automated exposure control, adjustment of the mA and/or kV according to patient size and/or use of iterative reconstruction technique. 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 No acute intracranial hemorrhage. No midline shift or other mass effect. Vascular: No abnormal hyperdensity of the major intracranial arteries or dural venous sinuses. No intracranial atherosclerosis. Skull: The visualized skull base, calvarium and extracranial soft tissues are normal. Sinuses/Orbits: No fluid levels or advanced mucosal thickening of the visualized paranasal sinuses. No mastoid or middle ear effusion. The orbits are normal. IMPRESSION: 1. No acute intracranial abnormality. 2. 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 Electronically Signed   By: Ulyses Jarred M.D.   On: 03/11/2022 21:39   DG Chest Port 1 View  Result Date: 03/11/2022 CLINICAL DATA:  Questionable sepsis. EXAM: PORTABLE CHEST 1 VIEW COMPARISON:  Chest x-ray 02/27/2022 FINDINGS: Right chest port catheter tip projects over the SVC. This is new from prior. The heart is enlarged, unchanged. There is no focal lung infiltrate, pleural effusion or pneumothorax. There are atherosclerotic calcifications of the aorta. No acute fractures are seen. IMPRESSION: 1. New right-sided chest port with distal catheter tip projecting over the SVC. 2. Stable cardiomegaly. 3. The lungs are clear. Electronically Signed   By: Ronney Asters M.D.   On: 03/11/2022 19:59   NM PET Image Initial (PI) Skull Base To Thigh  Result Date: 03/09/2022 CLINICAL DATA:  Initial treatment strategy for GE junction mass. EXAM: NUCLEAR MEDICINE PET SKULL BASE TO THIGH TECHNIQUE: 7.72 mCi F-18 FDG was injected intravenously. Full-ring PET imaging was performed from the skull base to thigh after the radiotracer. CT data was obtained and used for attenuation correction and anatomic  localization. Fasting blood glucose: 124 mg/dl COMPARISON:  CT scan and MRI abdomen 02/11/2022. FINDINGS: Mediastinal blood pool activity: SUV max 1.54 Liver activity: SUV max NA NECK: No hypermetabolic lymph nodes in the  neck. There is a 7.5 mm lymph node or nodule in the medial aspect of the left parotid gland which is hypermetabolic. SUV max is 6.75. Incidental CT findings: Extensive bilateral carotid artery calcifications. CHEST: A few small scattered pulmonary nodules as seen on the recent chest CT. These are too small to accurately characterize but no hypermetabolism. No enlarged or hypermetabolic mediastinal or hilar lymph nodes. No supraclavicular or axillary adenopathy. Hypermetabolic distal esophageal mass. This measures approximately 16 mm and the SUV max is 9.9. Findings consistent with esophageal cancer. There appears to be an exophytic mass at the GE junction but this is not demonstrate any hypermetabolism and could be a benign leiomyoma. Incidental CT findings: The Port-A-Cath is stable. Stable advanced vascular disease for age. ABDOMEN/PELVIS: Adjacent periportal lymph nodes are hypermetabolic with SUV max of 3.89. Low-attenuation lesion in or adjacent to the pancreatic head does not show any hypermetabolism. Bilateral adrenal gland lesions demonstrate hypermetabolism. The left has an SUV max of 6.06 on the right 4.74. Incidental CT findings: Stable vascular calcifications. Stable extensive calcified retroperitoneal lymph nodes. SKELETON: Healing mid sternal fracture demonstrates hypermetabolism. There are also bilateral healing anterior rib fractures which show mild hypermetabolism. No findings suspicious for osseous metastatic disease. Incidental CT findings: None. IMPRESSION: 1. Distal esophageal mass is hypermetabolic and consistent with esophageal cancer. 2. Large centrally necrotic hepatic mass is hypermetabolic and likely a metastatic focus. 3. Periportal hypermetabolic nodes consistent with  metastatic disease. 4. The exophytic GE junction mass does not show any hypermetabolism and is likely a benign lesion. 5. The pancreatic head lesion does not show any hypermetabolism and is likely benign. 6. Bilateral hypermetabolic adrenal gland lesions, likely metastatic disease. 7. No findings for pulmonary metastatic disease. 8. Small hypermetabolic left parotid gland lesion could be a benign or malignant neoplasm. Electronically Signed   By: Marijo Sanes M.D.   On: 03/09/2022 16:30   IR IMAGING GUIDED PORT INSERTION  Result Date: 03/07/2022 INDICATION: 61 year old male with upper GI/pancreaticobiliary cancer metastatic to the liver. He presents for placement of a port catheter to establish durable venous access. EXAM: IMPLANTED PORT A CATH PLACEMENT WITH ULTRASOUND AND FLUOROSCOPIC GUIDANCE MEDICATIONS: None. ANESTHESIA/SEDATION: Versed 1 mg IV; Fentanyl 50 mcg IV; Moderate Sedation Time:  15 minutes The patient's vital signs and level of consciousness were continuously monitored during the procedure by the interventional radiology nurse under my direct supervision. FLUOROSCOPY: Radiation exposure index: 0 mGy reference air kerma COMPLICATIONS: None immediate. PROCEDURE: The right neck and chest was prepped with chlorhexidine, and draped in the usual sterile fashion using maximum barrier technique (cap and mask, sterile gown, sterile gloves, large sterile sheet, hand hygiene and cutaneous antiseptic). Local anesthesia was attained by infiltration with 1% lidocaine with epinephrine. Ultrasound demonstrated patency of the right internal jugular vein, and this was documented with an image. Under real-time ultrasound guidance, this vein was accessed with a 21 gauge micropuncture needle and image documentation was performed. A small dermatotomy was made at the access site with an 11 scalpel. A 0.018" wire was advanced into the SVC and the access needle exchanged for a 23F micropuncture vascular sheath. The  0.018" wire was then removed and a 0.035" wire advanced into the IVC. An appropriate location for the subcutaneous reservoir was selected below the clavicle and an incision was made through the skin and underlying soft tissues. The subcutaneous tissues were then dissected using a combination of blunt and sharp surgical technique and a pocket was formed. A single lumen power injectable  portacatheter was then tunneled through the subcutaneous tissues from the pocket to the dermatotomy and the port reservoir placed within the subcutaneous pocket. The venous access site was then serially dilated and a peel away vascular sheath placed over the wire. The wire was removed and the port catheter advanced into position under fluoroscopic guidance. The catheter tip is positioned in the superior cavoatrial junction. This was documented with a spot image. The portacatheter was then tested and found to flush and aspirate well. The port was flushed with saline followed by 100 units/mL heparinized saline. The pocket was then closed in two layers using first subdermal inverted interrupted absorbable sutures followed by a running subcuticular suture. The epidermis was then sealed with Dermabond. The dermatotomy at the venous access site was also closed with Dermabond. IMPRESSION: Successful placement of a right IJ approach Power Port with ultrasound and fluoroscopic guidance. The catheter is ready for use. Electronically Signed   By: Jacqulynn Cadet M.D.   On: 03/07/2022 13:26   EEG adult  Result Date: 02/28/2022 Lora Havens, MD     02/28/2022  9:23 AM Patient Name: [name] [name] MRN: [id number] Epilepsy Attending: Lora Havens Referring Physician/Provider: Regan Lemming, MD Date: 02/28/2022 Duration: 26.07 mins Patient history: 61yo M with ams. EEG to evaluate for seizure Level of alertness: Asleep AEDs during EEG study: None Technical aspects: This EEG study was done with scalp electrodes positioned according to the  10-20 International system of electrode placement. Electrical activity was reviewed with band pass filter of 1-'70Hz'$ , sensitivity of 7 uV/mm, display speed of 82m/sec with a '60Hz'$  notched filter applied as appropriate. EEG data were recorded continuously and digitally stored.  Video monitoring was available and reviewed as appropriate. Description: Sleep was characterized by vertex waves, sleep spindles (12 to 14 Hz), maximal frontocentral region. Physiologic photic driving was not seen during photic stimulation. Hyperventilation was not performed.   IMPRESSION: This study during sleep is within normal limits. No seizures or epileptiform discharges were seen throughout the recording. A normal interictal EEG does not exclude nor support the diagnosis of epilepsy. If suspicion for interictal activity remains a concern, a prolonged study including sleep can be considered. PLora Havens  MR BRAIN WO CONTRAST  Result Date: 02/28/2022 CLINICAL DATA:  Initial evaluation for mental status change, unknown cause. EXAM: MRI HEAD WITHOUT CONTRAST TECHNIQUE: Multiplanar, multiecho pulse sequences of the brain and surrounding structures were obtained without intravenous contrast. COMPARISON:  Prior CT from 01/31/2022. FINDINGS: Brain: Cerebral volume within normal limits for age. Scattered patchy T2/FLAIR hyperintensity involving the periventricular, deep, and subcortical white matter of both cerebral hemispheres. Patchy involvement of the pons. Findings most likely related chronic microvascular ischemic disease, and are moderately advanced. Few small remote lacunar infarcts present about the right basal ganglia, left thalamus, and pons. Multiple remote right greater than left cerebellar infarcts noted. Encephalomalacia and gliosis involving the left occipital lobe consistent with a chronic left PCA distribution infarct. Associated mild chronic hemosiderin staining at this location. Multiple subtle subcentimeter foci of  mild diffusion abnormality are seen involving a deep and subcortical white matter of both cerebral hemispheres (series 5, images 98-89), suspicious for small evolving acute to early subacute ischemic infarcts. Largest of these measures 7 mm at the left parietal region (series 5, image 89). No associated hemorrhage or mass effect. Otherwise, gray-white matter differentiation maintained. No other areas of chronic cortical infarction. No other acute or chronic intracranial blood products. No mass lesion, midline shift or  mass effect. No hydrocephalus or extra-axial fluid collection. Pituitary gland suprasellar region within normal limits. Vascular: Major intracranial vascular flow voids are maintained. Skull and upper cervical spine: Craniocervical junction within normal limits. Bone marrow signal intensity normal. No scalp soft tissue abnormality. Sinuses/Orbits: Globes orbital soft tissues within normal limits. Few small retention cysts noted about the maxillary sinuses. Paranasal sinuses are otherwise largely clear. Small right mastoid effusion noted, of doubtful significance. Other: None. IMPRESSION: 1. Multiple subcentimeter foci of diffusion abnormality involving supratentorial cerebral white matter as above, suspicious for small evolving acute to early subacute ischemic infarcts. No associated hemorrhage or mass effect. 2. Multiple remote right greater than left cerebellar infarcts, remote left PCA territory infarct, with additional chronic lacunar infarcts involving the right basal ganglia, left thalamus, and pons. 3. Underlying moderately advanced chronic microvascular ischemic disease. Electronically Signed   By: Jeannine Boga M.D.   On: 02/28/2022 03:13   CT Angio Chest PE W and/or Wo Contrast  Result Date: 02/27/2022 CLINICAL DATA:  History of pulmonary embolism, chest pain, shortness of breath EXAM: CT ANGIOGRAPHY CHEST WITH CONTRAST TECHNIQUE: Multidetector CT imaging of the chest was performed  using the standard protocol during bolus administration of intravenous contrast. Multiplanar CT image reconstructions and MIPs were obtained to evaluate the vascular anatomy. RADIATION DOSE REDUCTION: This exam was performed according to the departmental dose-optimization program which includes automated exposure control, adjustment of the mA and/or kV according to patient size and/or use of iterative reconstruction technique. CONTRAST:  19m OMNIPAQUE IOHEXOL 350 MG/ML SOLN COMPARISON:  02/11/2022 FINDINGS: Cardiovascular: Coronary artery calcifications are seen. Heart is enlarged in size. RV LV ratio is 1.6. Coronary artery calcifications are seen. There is homogeneous enhancement in thoracic aorta. There are segmental and subsegmental filling defects in left lower lobe, right upper lobe and right lower lobe. There is interval appearance of small filling defect in segmental branching right lower lobe. There is small thrombus burden. Mediastinum/Nodes: There are enlarged lymph nodes anterior to the right side of heart measuring up to 12 mm in short axis. In the previous study, the no measure 11 mm in short axis. There are a few other subcentimeter nodes in mediastinum with no significant change. Lungs/Pleura: There is interval decrease in bilateral pleural effusions. There is small residual left pleural effusion. There is interval improvement in aeration of lower lung fields suggesting resolving atelectasis/pneumonia. There is no pneumothorax. Upper Abdomen: There is large ill-defined mass lesion in the liver measuring up to 8.5 cm in maximum diameter. This finding has not changed significantly. Findings suggest possible primary or metastatic malignancy in liver. There is mild nodularity and liver surface suggesting possible cirrhosis. Air is seen in intrahepatic bile ducts, possibly suggesting sphincterotomy. Gallbladder fossa is not included in the images. There are possible enlarged lymph nodes along the lesser  curvature aspect of the stomach measuring up to 2.2 cm in short axis with interval increase in size. Musculoskeletal: No acute findings are seen. Review of the MIP images confirms the above findings. IMPRESSION: There is pulmonary embolism in segmental and subsegmental branches in left lower lobe and right upper lobe with no significant change. There is interval appearance of intraluminal filling defect in a segmental branch in the right lower lobe. Thrombus burden is small. Right ventricular cavity is more prominent in size in comparison to the left ventricular cavity suggesting right ventricular strain which may be chronic. There is a 8.5 cm ill-defined low-density space-occupying lesion in liver suggesting possible malignant neoplasm. Air in  the intrahepatic bile ducts may suggest sphincterotomy. There is interval increase in size of pericardial lymph nodes and lymph nodes along the lesser curvature aspect of the stomach suggesting progression of metastatic lymphadenopathy. Follow-up PET-CT and tissue sampling as clinically warranted should be considered. Other findings as described in the body of the report. Electronically Signed   By: Elmer Picker M.D.   On: 02/27/2022 19:26   DG Chest 2 View  Result Date: 02/27/2022 CLINICAL DATA:  Chest pain and shortness of breath. EXAM: CHEST - 2 VIEW COMPARISON:  02/11/2022 FINDINGS: The cardiopericardial silhouette is within normal limits for size. The lungs are clear without focal pneumonia, edema, pneumothorax or pleural effusion. The visualized bony structures of the thorax are unremarkable. IMPRESSION: No active cardiopulmonary disease. Left pleural effusion seen previously has resolved in the interval. Electronically Signed   By: Misty Stanley M.D.   On: 02/27/2022 13:46   US BIOPSY (LIVER)  Result Date: 02/15/2022 INDICATION: No known primary, now with large hepatic mass as well as upper abdominal lymphadenopathy worrisome for metastatic disease.  Please perform ultrasound-guided liver mass biopsy for tissue diagnostic purposes. EXAM: ULTRASOUND GUIDED LIVER LESION BIOPSY COMPARISON:  Abdominal MRI-02/11/2022; CT abdomen and pelvis-02/11/2022 MEDICATIONS: None ANESTHESIA/SEDATION: Moderate (conscious) sedation was employed during this procedure as administered by the Interventional Radiology RN. A total of Versed 1 mg and Fentanyl 25 mcg was administered intravenously. Moderate Sedation Time: 14 minutes. The patient's level of consciousness and vital signs were monitored continuously by radiology nursing throughout the procedure under my direct supervision. COMPLICATIONS: None immediate. PROCEDURE: Informed written consent was obtained from the patient after a discussion of the risks, benefits and alternatives to treatment. The patient understands and consents the procedure. A timeout was performed prior to the initiation of the procedure. Ultrasound scanning was performed of the right upper abdominal quadrant demonstrates an at least 7.9 x 7.6 cm hypoechoic mass involving the majority of the medial segment of the left lobe of the liver (image 5), compatible with the dominant mass seen on preceding abdominal MRI. The procedure was planned. The right upper abdominal quadrant was prepped and draped in the usual sterile fashion. The overlying soft tissues were anesthetized with 1% lidocaine with epinephrine. A 17 gauge, 6.8 cm co-axial needle was advanced into a peripheral aspect of the lesion. This was followed by 6 core biopsies with an 18 gauge core device under direct ultrasound guidance. The coaxial needle tract was embolized with a small amount of Gel-Foam slurry and superficial hemostasis was obtained with manual compression. Post procedural scanning was negative for definitive area of hemorrhage or additional complication. A dressing was placed. The patient tolerated the procedure well without immediate post procedural complication. IMPRESSION:  Technically successful ultrasound guided core needle biopsy of large mass occupying the majority of the medial segment of the left lobe of the liver. Electronically Signed   By: Sandi Mariscal M.D.   On: 02/15/2022 16:27     I spent 80 minutes for this patient encounter including review of prior medical records/discussing diagnostics and treatment plan with the patient/family/coordinate care with primary/other specialits with greater than 50% of time in face to face encounter.   Electronically signed by:   Rosiland Oz, MD Infectious Disease Physician [facility] Pager: [phone]

## 2022-03-15 NOTE — TOC Initial Note (Signed)
Transition of Care [facility]) - Initial/Assessment Note    Patient Details  Name: TREG DIEMER MRN: [id number] Date of Birth: [birth year]  Transition of Care [facility]) CM/SW Contact:    Joaquin Courts, RN Phone Number: 03/15/2022, 1:27 PM  Clinical Narrative:                 CM noted patient active with [facility] for HHPT/OT services prior to admission.  Spoke with Rep who confirms agency can resume services at discharge.  MD notified, patient will need resumption of care orders. No further TOC needs identified.   Expected Discharge Plan: [place] Barriers to Discharge: No Barriers Identified   Patient Goals and CMS Choice Patient states their goals for this hospitalization and ongoing recovery are:: to get better CMS Medicare.gov Compare Post Acute Care list provided to:: Patient Choice offered to / list presented to : Patient  Expected Discharge Plan and Services Expected Discharge Plan: [place]   Discharge Planning Services: CM Consult Post Acute Care Choice: Resumption of Svcs/PTA Provider Living arrangements for the past 2 months: Single Family Home                           HH Arranged: PT, OT [place] Agency: [place] Date [place]: 03/15/22 Time [place]: 99 Representative spoke with at [place]: [place] Arrangements/Services Living arrangements for the past 2 months: [place]   Patient language and need for interpreter reviewed:: Yes Do you feel safe going back to the place where you live?: Yes      Need for Family Participation in Patient Care: Yes (Comment) Care giver support system in place?: Yes (comment)   Criminal Activity/Legal Involvement Pertinent to Current Situation/Hospitalization: No - Comment as needed  Activities of Daily Living Home Assistive Devices/Equipment: Eyeglasses, Dentures (specify type), Wheelchair, Prosthesis, Walker (specify type) ADL  Screening (condition at time of admission) Patient's cognitive ability adequate to safely complete daily activities?: Yes Is the patient deaf or have difficulty hearing?: No Does the patient have difficulty seeing, even when wearing glasses/contacts?: No Does the patient have difficulty concentrating, remembering, or making decisions?: No Patient able to express need for assistance with ADLs?: Yes Does the patient have difficulty dressing or bathing?: Yes Independently performs ADLs?: No Communication: Independent Dressing (OT): Independent Grooming: Independent Feeding: Independent Bathing: Independent Toileting: Independent In/Out Bed: Independent Walks in Home: Independent Does the patient have difficulty walking or climbing stairs?: Yes Weakness of Legs: Right Weakness of Arms/Hands: None  Permission Sought/Granted                  Emotional Assessment Appearance:: Appears stated age Attitude/Demeanor/Rapport: Engaged Affect (typically observed): Accepting Orientation: : Oriented to Self, Oriented to Place, Oriented to  Time, Oriented to Situation   Psych Involvement: No (comment)  Admission diagnosis:  Generalized weakness [R53.1] Fever in adult [X41.2] Acute metabolic encephalopathy [I78.67] Patient Active Problem List   Diagnosis Date Noted   Acute metabolic encephalopathy 67/20/9470   SIRS (systemic inflammatory response syndrome) ([place]) 03/11/2022   Acute urinary retention 03/11/2022   Stroke ([place]) 02/28/2022   Chest wall pain    Adenocarcinoma of unknown origin ([place]) 02/22/2022   Hx of AKA (above knee amputation), left ([place]) 01/31/2022   Cardiac arrest ([place]) 01/31/2022   Anoxic brain injury ([place]) 01/31/2022   Thrombocytopenia ([place]) 01/31/2022   Pulmonary emboli ([place]) 01/31/2022  Aspiration pneumonia ([place]) 01/31/2022   Arm pain 07/25/2021   PAD (peripheral artery disease) ([place])    Hypokalemia 05/30/2018   S/P lumbar fusion, Lower back pain 01/03/2018    Coronary artery disease involving native coronary artery of native heart with unstable angina pectoris [facility])    Preventative health care 03/13/2017   Tobacco use disorder 08/20/2012   Normocytic anemia 08/06/2012   HTN (hypertension) 04/20/2011   HLD (hyperlipidemia) 10/08/2009   Rheumatoid arthritis ([place]) 08/11/2009   PCP:  Angelique Blonder, DO Pharmacy:   [id number] [address], [place] [address] Phone: [phone] Fax: [phone]  [facility] [address] [place] [address] Phone: [phone] Fax: [phone]     Social Determinants of Health (SDOH) Interventions    Readmission Risk Interventions    03/15/2022    1:24 PM 05/13/2020    2:32 PM 05/04/2020   12:24 PM  Readmission Risk Prevention Plan  Post Dischage Appt   Complete  Medication Screening   Complete  Transportation Screening Complete Complete Complete  PCP or Specialist Appt within 5-7 Days  Complete   Home Care Screening  Complete   Medication Review (RN CM)  Complete   Medication Review (RN Care Manager) Complete    HRI or [place] Complete    SW Recovery Care/Counseling Consult Complete    Palliative Care Screening Not [place] Not Applicable

## 2022-03-15 NOTE — Progress Notes (Signed)
PROGRESS NOTE  [name] [name]  DOB: 1960/09/15  PCP: Angelique Blonder, DO JME:[id number]  DOA: 03/11/2022  LOS: 4 days  Hospital Day: 5  Brief narrative: [name] [name] is a 61 y.o. male with PMH significant for HTN, HLD, CAD/MI 01/2022, aortic valve mass, PAD s/p left AKA 2021, prior endarterectomies and extensive vascular surgery in 2021, DVT, GERD who was recently diagnosed of pancreatic mass in 02/11/2022 concerning for possible metastatic esophageal carcinoma. 9/16, patient was brought in with complaint of altered mental status  In the ED, patient had a fever of 103.3, hypotensive to 90s Labs with WC count of 5.3, hemoglobin 8.5, creatinine 1 Bladder scan showed 500 cc in and out cath was performed CT head showed old infarcts  CT chest abdomen pelvis showed no new changes other than known large hepatic metastases and upper abdominal nodal mets with a 4.3 pseudocyst at the pancreatic head Admitted to hospitalist service Blood culture obtained on admission grew E. Coli Oncology and IR consulted. The source of bacteremia is suspected to be necrotic liver mass and hence IR plans to obtain sample and put a drain on 9/22 after 5 days of aspirin and Plavix washout.  Subjective: Patient was seen and examined this morning.  Pleasant middle-aged male, looks older for his age.  Lying down in bed.  Not in distress. Chart reviewed Hemodynamically stable.  Labs from this morning with WBC count normal, hemoglobin remains low and stable at 9  Assessment and plan: Sepsis POA E. coli bacteremia Currently on IV Ancef The source of bacteremia is suspected to be necrotic liver mass and hence IR plans to obtain sample and put a drain on 9/22 after 5 days of aspirin and Plavix washout. Continue IV fluid Recent Labs  Lab 03/11/22 1930 03/11/22 2113 03/12/22 0349 03/13/22 0355 03/14/22 0415 03/15/22 0356  WBC 5.3  --  4.6 5.5 5.9 6.7  LATICACIDVEN 1.5 1.6  --   --   --   --   PROCALCITON  --   --   1.03  --   --   --    Metastatic adenocarcinoma, likely esophagus primary  appreciate input by Dr. Benay Spice.  Noted a plan to initiate systemic therapy as an outpatient.   Acute urinary retention Resolved after in and out-try to avoid recatheterization.  Essential hypertension Blood pressure elevated to 170s PTA on amlodipine 10 mg daily, chlorthalidone 25 mg daily.  Currently both on hold. I will resume both today.   Prior fractured sternum and ribs with radiculopathy Continue oxycodone 10 mg every 4 hours as needed, gabapentin 100 milligram daily as needed Continue diclofenac patch and tylenol for pain Pain is manageable at this time   Prior pulmonary embolism/DVT Recurrence on 02/27/2022 Unclear if patient is compliant on meds-discharged on apixaban 5 mg twice daily. Currently on full dose lovenox until biopsy procedure can be accomplished   CABG status post stent 2020  PAD with stenting status post AKA left side 04/2020 Previously on aspirin, Plavix, statin.   Aspirin Plavix currently on hold.  Continue Lipitor Continue outpatient follow-up.   Goals of care   Code Status: Full Code    Mobility: Currently remains in bed.  He needs a prosthesis on his left leg.  I asked him to have someone bring it from home.  PT eval to be obtained  Skin assessment:     Nutritional status:  Body mass index is 24.64 kg/m.          Diet:  Diet Order             Diet Heart Room service appropriate? Yes; Fluid consistency: Thin  Diet effective now                   DVT prophylaxis:     Antimicrobials: IV Ancef Fluid: None Consultants: ID, IR, oncology Family Communication: None at bedside  Status is: Inpatient  Continue in-hospital care because: Pending liver mass sampling/drainage Level of care: Progressive   Dispo: The patient is from: Home              Anticipated d/c is to: Pending clinical course              Patient currently is not medically stable to  d/c.   Difficult to place patient No     Infusions:    ceFAZolin (ANCEF) IV 2 g (03/15/22 1314)    Scheduled Meds:  amLODipine  10 mg Oral Daily   atorvastatin  80 mg Oral q1800   Chlorhexidine Gluconate Cloth  6 each Topical Daily   chlorthalidone  25 mg Oral Daily   diclofenac  1 patch Transdermal BID   DULoxetine  30 mg Oral Daily   enoxaparin (LOVENOX) injection  1 mg/kg Subcutaneous BID   feeding supplement  237 mL Oral BID BM   metroNIDAZOLE  500 mg Oral Q12H   sodium chloride flush  10-40 mL Intracatheter Q12H   sodium chloride flush  3 mL Intravenous Q12H    PRN meds: acetaminophen **OR** acetaminophen, gabapentin, oxyCODONE-acetaminophen **AND** oxyCODONE, prochlorperazine, senna-docusate   Antimicrobials: Anti-infectives (From admission, onward)    Start     Dose/Rate Route Frequency Ordered Stop   03/15/22 0600  ceFAZolin (ANCEF) IVPB 2g/100 mL premix        2 g 200 mL/hr over 30 Minutes Intravenous Every 8 hours 03/14/22 1158     03/13/22 1330  metroNIDAZOLE (FLAGYL) tablet 500 mg        500 mg Oral Every 12 hours 03/13/22 1316     03/12/22 1500  cefTRIAXone (ROCEPHIN) 2 g in sodium chloride 0.9 % 100 mL IVPB  Status:  Discontinued        2 g 200 mL/hr over 30 Minutes Intravenous Daily 03/12/22 1418 03/14/22 1158   03/12/22 1000  metroNIDAZOLE (FLAGYL) IVPB 500 mg  Status:  Discontinued        500 mg 100 mL/hr over 60 Minutes Intravenous Every 12 hours 03/12/22 0042 03/12/22 1057   03/12/22 0800  vancomycin (VANCOREADY) IVPB 750 mg/150 mL  Status:  Discontinued        750 mg 150 mL/hr over 60 Minutes Intravenous Every 12 hours 03/12/22 0341 03/12/22 1057   03/12/22 0600  ceFEPIme (MAXIPIME) 2 g in sodium chloride 0.9 % 100 mL IVPB  Status:  Discontinued        2 g 200 mL/hr over 30 Minutes Intravenous Every 8 hours 03/12/22 0337 03/12/22 1418   03/11/22 2115  vancomycin (VANCOREADY) IVPB 1500 mg/300 mL        1,500 mg 150 mL/hr over 120 Minutes  Intravenous  Once 03/11/22 2101 03/12/22 0003   03/11/22 2030  ceFEPIme (MAXIPIME) 2 g in sodium chloride 0.9 % 100 mL IVPB        2 g 200 mL/hr over 30 Minutes Intravenous  Once 03/11/22 2023 03/12/22 0002   03/11/22 2030  metroNIDAZOLE (FLAGYL) IVPB 500 mg        500 mg 100 mL/hr over  60 Minutes Intravenous  Once 03/11/22 2023 03/12/22 0002   03/11/22 2030  vancomycin (VANCOCIN) IVPB 1000 mg/200 mL premix  Status:  Discontinued        1,000 mg 200 mL/hr over 60 Minutes Intravenous  Once 03/11/22 2023 03/11/22 2101       Objective: Vitals:   03/15/22 0510 03/15/22 1325  BP: (!) 175/99 (!) 152/102  Pulse: (!) 105 100  Resp: 16 17  Temp: 98 F (36.7 C) 97.7 F (36.5 C)  SpO2: 93% 99%    Intake/Output Summary (Last 24 hours) at 03/15/2022 1346 Last data filed at 03/15/2022 1042 Gross per 24 hour  Intake 680 ml  Output 1600 ml  Net -920 ml   Filed Weights   03/12/22 0500 03/13/22 0440 03/15/22 0500  Weight: 71.8 kg 72.1 kg 71.4 kg   Weight change:  Body mass index is 24.64 kg/m.   Physical Exam: General exam: Pleasant, middle-aged Skin: No rashes, lesions or ulcers. HEENT: Atraumatic, normocephalic, no obvious bleeding Lungs: Clear to auscultation bilaterally CVS: Regular rate and rhythm, no murmur GI/Abd soft, tenderness present in right upper quadrant and epigastrium, nondistended, bowel sound present CNS: Alert, awake, oriented x3 Psychiatry: Mood appropriate. Extremities: No pedal edema on the right.  Left prior AKA status  Data Review: I have personally reviewed the laboratory data and studies available.  F/u labs  Unresulted Labs (From admission, onward)    None       Signed, Terrilee Croak, MD [facility] 03/15/2022

## 2022-03-16 DIAGNOSIS — R6510 Systemic inflammatory response syndrome (SIRS) of non-infectious origin without acute organ dysfunction: Secondary | ICD-10-CM | POA: Diagnosis not present

## 2022-03-16 LAB — BASIC METABOLIC PANEL
Anion gap: 9 (ref 5–15)
BUN: 8 mg/dL (ref 8–23)
CO2: 24 mmol/L (ref 22–32)
Calcium: 9.2 mg/dL (ref 8.9–10.3)
Chloride: 107 mmol/L (ref 98–111)
Creatinine, Ser: 0.81 mg/dL (ref 0.61–1.24)
GFR, Estimated: >60 mL/min (ref >60)
Glucose, Bld: 99 mg/dL (ref 70–99)
Potassium: 3.5 mmol/L (ref 3.5–5.1)
Sodium: 140 mmol/L (ref 135–145)

## 2022-03-16 LAB — PHOSPHORUS: Phosphorus: 4.1 mg/dL (ref 2.5–4.6)

## 2022-03-16 LAB — CBC
HCT: 35.1 % — ABNORMAL LOW (ref 39.0–52.0)
Hemoglobin: 10.7 g/dL — ABNORMAL LOW (ref 13.0–17.0)
MCH: 25.8 pg — ABNORMAL LOW (ref 26.0–34.0)
MCHC: 30.5 g/dL (ref 30.0–36.0)
MCV: 84.6 fL (ref 80.0–100.0)
Platelets: 254 10*3/uL (ref 150–400)
RBC: 4.15 MIL/uL — ABNORMAL LOW (ref 4.22–5.81)
RDW: 17.2 % — ABNORMAL HIGH (ref 11.5–15.5)
WBC: 7.9 10*3/uL (ref 4.0–10.5)
nRBC: 0 % (ref 0.0–0.2)

## 2022-03-16 LAB — MAGNESIUM: Magnesium: 1.7 mg/dL (ref 1.7–2.4)

## 2022-03-16 MED ORDER — POTASSIUM CHLORIDE CRYS ER 20 MEQ PO TBCR
40.0000 meq | EXTENDED_RELEASE_TABLET | Freq: Once | ORAL | Status: AC
  Administered 2022-03-16: 40 meq via ORAL
  Filled 2022-03-16: qty 2

## 2022-03-16 MED ORDER — ENOXAPARIN SODIUM 80 MG/0.8ML IJ SOSY
70.0000 mg | PREFILLED_SYRINGE | Freq: Two times a day (BID) | INTRAMUSCULAR | Status: AC
  Administered 2022-03-16: 70 mg via SUBCUTANEOUS
  Filled 2022-03-16: qty 0.8

## 2022-03-16 MED ORDER — ENOXAPARIN SODIUM 80 MG/0.8ML IJ SOSY: 1.0000 mg/kg | PREFILLED_SYRINGE | Freq: Two times a day (BID) | INTRAMUSCULAR | Status: DC

## 2022-03-16 MED ORDER — ENOXAPARIN SODIUM 80 MG/0.8ML IJ SOSY: 70.0000 mg | PREFILLED_SYRINGE | Freq: Two times a day (BID) | INTRAMUSCULAR | Status: DC

## 2022-03-16 NOTE — Progress Notes (Signed)
PROGRESS NOTE  [name] [name]  DOB: 01-23-1961  PCP: Angelique Blonder, DO VEH:[id number]  DOA: 03/11/2022  LOS: 5 days  Hospital Day: 6  Brief narrative: [name] [name] is a 61 y.o. male with PMH significant for HTN, HLD, CAD/MI 01/2022, aortic valve mass, PAD s/p left AKA 2021, prior endarterectomies and extensive vascular surgery in 2021, DVT, GERD who was recently diagnosed of pancreatic mass in 02/11/2022 concerning for possible metastatic esophageal carcinoma. 9/16, patient was brought in with complaint of altered mental status  In the ED, patient had a fever of 103.3, hypotensive to 90s Labs with WC count of 5.3, hemoglobin 8.5, creatinine 1 Bladder scan showed 500 cc in and out cath was performed CT head showed old infarcts  CT chest abdomen pelvis showed no new changes other than known large hepatic metastases and upper abdominal nodal mets with a 4.3 pseudocyst at the pancreatic head Admitted to hospitalist service Blood culture obtained on admission grew E. Coli Oncology and IR consulted. The source of bacteremia is suspected to be necrotic liver mass and hence IR plans to obtain sample and put a drain on 9/22 after 5 days of aspirin and Plavix washout.  Subjective: Patient was seen and examined this morning.   Lying down in bed.  Not in distress.  No new symptoms.  No fever overnight.  Remains hemodynamically stable.    Assessment and plan: Sepsis POA E. coli bacteremia Currently on IV Ancef The source of bacteremia is suspected to be necrotic liver mass and hence IR plans to obtain sample on 9/22.  Depending on the culture growth, may need drain placement as well at a later date Aspirin and Plavix on hold.  Recent Labs  Lab 03/11/22 1930 03/11/22 2113 03/12/22 0349 03/13/22 0355 03/14/22 0415 03/15/22 0356 03/16/22 1147  WBC 5.3  --  4.6 5.5 5.9 6.7 7.9  LATICACIDVEN 1.5 1.6  --   --   --   --   --   PROCALCITON  --   --  1.03  --   --   --   --     Metastatic  adenocarcinoma, likely esophagus primary  appreciate input by Dr. Benay Spice.  Noted a plan to initiate systemic therapy as an outpatient.   Acute urinary retention Resolved after in and out-try to avoid recatheterization.  Essential hypertension Currently controlled on amlodipine 10 mg daily, chlorthalidone 25 mg daily.     Prior fractured sternum and ribs with radiculopathy Continue oxycodone 10 mg every 4 hours as needed, gabapentin 100 milligram daily as needed Continue diclofenac patch and tylenol for pain Pain is manageable at this time   Prior pulmonary embolism/DVT Recurrence on 02/27/2022 Unclear if patient is compliant on meds-discharged on apixaban 5 mg twice daily. Currently on full dose lovenox until biopsy procedure can be accomplished   CABG status post stent 2020  PAD with stenting status post AKA left side 04/2020 Previously on aspirin, Plavix, statin.   Aspirin Plavix currently on hold.  Continue Lipitor Continue outpatient follow-up.   Goals of care   Code Status: Full Code   Mobility: Currently remains in bed.  He needs a prosthesis on his left leg.  Patient states someone is bringing it from home today.  Needs PT eval after that  Skin assessment:     Nutritional status:  Body mass index is 24.69 kg/m.          Diet:  Diet Order  Diet NPO time specified Except for: Sips with Meds  Diet effective midnight           Diet Heart Room service appropriate? Yes; Fluid consistency: Thin  Diet effective now                   DVT prophylaxis:     Antimicrobials: IV Ancef Fluid: None Consultants: ID, IR, oncology Family Communication: None at bedside  Status is: Inpatient  Continue in-hospital care because: Pending liver mass sampling tomorrow Level of care: Telemetry   Dispo: The patient is from: Home              Anticipated d/c is to: Pending clinical course              Patient currently is not medically stable to d/c.    Difficult to place patient No     Infusions:    ceFAZolin (ANCEF) IV 2 g (03/16/22 0518)    Scheduled Meds:  amLODipine  10 mg Oral Daily   atorvastatin  80 mg Oral q1800   Chlorhexidine Gluconate Cloth  6 each Topical Daily   chlorthalidone  25 mg Oral Daily   diclofenac  1 patch Transdermal BID   DULoxetine  30 mg Oral Daily   enoxaparin (LOVENOX) injection  70 mg Subcutaneous Q12H   feeding supplement  237 mL Oral BID BM   metroNIDAZOLE  500 mg Oral Q12H   sodium chloride flush  10-40 mL Intracatheter Q12H   sodium chloride flush  3 mL Intravenous Q12H    PRN meds: acetaminophen **OR** acetaminophen, gabapentin, oxyCODONE-acetaminophen **AND** oxyCODONE, prochlorperazine, senna-docusate   Antimicrobials: Anti-infectives (From admission, onward)    Start     Dose/Rate Route Frequency Ordered Stop   03/15/22 0600  ceFAZolin (ANCEF) IVPB 2g/100 mL premix        2 g 200 mL/hr over 30 Minutes Intravenous Every 8 hours 03/14/22 1158     03/13/22 1330  metroNIDAZOLE (FLAGYL) tablet 500 mg        500 mg Oral Every 12 hours 03/13/22 1316     03/12/22 1500  cefTRIAXone (ROCEPHIN) 2 g in sodium chloride 0.9 % 100 mL IVPB  Status:  Discontinued        2 g 200 mL/hr over 30 Minutes Intravenous Daily 03/12/22 1418 03/14/22 1158   03/12/22 1000  metroNIDAZOLE (FLAGYL) IVPB 500 mg  Status:  Discontinued        500 mg 100 mL/hr over 60 Minutes Intravenous Every 12 hours 03/12/22 0042 03/12/22 1057   03/12/22 0800  vancomycin (VANCOREADY) IVPB 750 mg/150 mL  Status:  Discontinued        750 mg 150 mL/hr over 60 Minutes Intravenous Every 12 hours 03/12/22 0341 03/12/22 1057   03/12/22 0600  ceFEPIme (MAXIPIME) 2 g in sodium chloride 0.9 % 100 mL IVPB  Status:  Discontinued        2 g 200 mL/hr over 30 Minutes Intravenous Every 8 hours 03/12/22 0337 03/12/22 1418   03/11/22 2115  vancomycin (VANCOREADY) IVPB 1500 mg/300 mL        1,500 mg 150 mL/hr over 120 Minutes Intravenous  Once  03/11/22 2101 03/12/22 0003   03/11/22 2030  ceFEPIme (MAXIPIME) 2 g in sodium chloride 0.9 % 100 mL IVPB        2 g 200 mL/hr over 30 Minutes Intravenous  Once 03/11/22 2023 03/12/22 0002   03/11/22 2030  metroNIDAZOLE (FLAGYL) IVPB 500 mg  500 mg 100 mL/hr over 60 Minutes Intravenous  Once 03/11/22 2023 03/12/22 0002   03/11/22 2030  vancomycin (VANCOCIN) IVPB 1000 mg/200 mL premix  Status:  Discontinued        1,000 mg 200 mL/hr over 60 Minutes Intravenous  Once 03/11/22 2023 03/11/22 2101       Objective: Vitals:   03/15/22 2113 03/16/22 0455  BP: (!) 144/102 (!) 127/94  Pulse: 100 61  Resp: 18 16  Temp: 98 F (36.7 C) (!) 97.5 F (36.4 C)  SpO2: 97% 97%    Intake/Output Summary (Last 24 hours) at 03/16/2022 1415 Last data filed at 03/16/2022 0600 Gross per 24 hour  Intake 580 ml  Output 1300 ml  Net -720 ml    Filed Weights   03/13/22 0440 03/15/22 0500 03/16/22 0500  Weight: 72.1 kg 71.4 kg 71.5 kg   Weight change: 0.14 kg Body mass index is 24.69 kg/m.   Physical Exam: General exam: Pleasant, middle-aged Skin: No rashes, lesions or ulcers. HEENT: Atraumatic, normocephalic, no obvious bleeding Lungs: Clear to auscultation bilaterally CVS: Regular rate and rhythm, no murmur GI/Abd soft, tenderness present in right upper quadrant and epigastrium, nondistended, bowel sound present CNS: Alert, awake, oriented x3 Psychiatry: Mood appropriate. Extremities: No pedal edema on the right.  Left prior AKA status  Data Review: I have personally reviewed the laboratory data and studies available.  F/u labs  Unresulted Labs (From admission, onward)     Start     Ordered   03/17/22 0500  Protime-INR  Once,   R       Question:  Specimen collection method  Answer:  IV Team=IV Team collect   03/15/22 1531            Signed, Terrilee Croak, MD [facility] 03/16/2022

## 2022-03-16 NOTE — Progress Notes (Signed)
PT Cancellation Note  Patient Details Name: [name] [name] MRN: [id number] DOB: [birth year]   Cancelled Treatment:    Reason Eval/Treat Not Completed: Patient declined, no reason specified. Pt declines PT eval, waiting for prosthesis from family, states he is unable to transfer without it and doesn't want to mobilize at this time.    Tori Emilee Market PT, DPT 03/16/22, 12:12 PM

## 2022-03-16 NOTE — Care Management Important Message (Signed)
Important Message  Patient Details IM Letter given to the Patient. Name: [name] [name] MRN: [id number] Date of Birth: 06/25/1961   Medicare Important Message Given:  Yes     [name] 03/16/2022, 9:40 AM

## 2022-03-16 NOTE — Progress Notes (Signed)
OT Cancellation Note  Patient Details Name: SEITH AIKEY MRN: [id number] DOB: 04-Apr-1961   Cancelled Treatment:    Reason Eval/Treat Not Completed:  (Patient deferred stating he prefers to work with therapy once someone brings his prosthesis. He asked for therapy to check back in the p.m.)  Leota Sauers 03/16/2022, 9:30 AM

## 2022-03-16 NOTE — Progress Notes (Signed)
OT Cancellation Note  Patient Details Name: LEXINGTON KROTZ MRN: [id number] DOB: 1960/10/04   Cancelled Treatment:    Reason Eval/Treat Not Completed:  (Still awating pt's prosthesis, which he prefers to have prior to working with therapy. Will follow-up tomorrow, as able.)  Leota Sauers 03/16/2022, 3:35 PM

## 2022-03-17 ENCOUNTER — Inpatient Hospital Stay: Payer: Medicare Other

## 2022-03-17 ENCOUNTER — Encounter: Payer: Self-pay | Admitting: *Deleted

## 2022-03-17 ENCOUNTER — Inpatient Hospital Stay (HOSPITAL_COMMUNITY): Payer: Medicare Other

## 2022-03-17 DIAGNOSIS — R6510 Systemic inflammatory response syndrome (SIRS) of non-infectious origin without acute organ dysfunction: Secondary | ICD-10-CM | POA: Diagnosis not present

## 2022-03-17 LAB — PROTIME-INR
INR: 1.2 (ref 0.8–1.2)
Prothrombin Time: 15.3 seconds — ABNORMAL HIGH (ref 11.4–15.2)

## 2022-03-17 MED ORDER — MIDAZOLAM HCL 2 MG/2ML IJ SOLN
INTRAMUSCULAR | Status: AC | PRN
  Administered 2022-03-17: 1 mg via INTRAVENOUS

## 2022-03-17 MED ORDER — APIXABAN 5 MG PO TABS
5.0000 mg | ORAL_TABLET | Freq: Two times a day (BID) | ORAL | Status: DC
  Administered 2022-03-17 – 2022-03-18 (×2): 5 mg via ORAL
  Filled 2022-03-17 (×2): qty 1

## 2022-03-17 MED ORDER — LIDOCAINE HCL 1 % IJ SOLN
INTRAMUSCULAR | Status: AC
  Filled 2022-03-17: qty 20

## 2022-03-17 MED ORDER — GELATIN ABSORBABLE 12-7 MM EX MISC
CUTANEOUS | Status: AC
  Filled 2022-03-17: qty 1

## 2022-03-17 MED ORDER — MIDAZOLAM HCL 2 MG/2ML IJ SOLN
INTRAMUSCULAR | Status: AC
  Filled 2022-03-17: qty 2

## 2022-03-17 MED ORDER — LIDOCAINE HCL 1 % IJ SOLN
INTRAMUSCULAR | Status: AC | PRN
  Administered 2022-03-17: 10 mL via INTRADERMAL

## 2022-03-17 MED ORDER — CLOPIDOGREL BISULFATE 75 MG PO TABS
75.0000 mg | ORAL_TABLET | Freq: Every day | ORAL | Status: DC
  Administered 2022-03-18: 75 mg via ORAL
  Filled 2022-03-17: qty 1

## 2022-03-17 MED ORDER — FENTANYL CITRATE (PF) 100 MCG/2ML IJ SOLN
INTRAMUSCULAR | Status: AC
  Filled 2022-03-17: qty 2

## 2022-03-17 MED ORDER — FENTANYL CITRATE (PF) 100 MCG/2ML IJ SOLN
INTRAMUSCULAR | Status: AC | PRN
  Administered 2022-03-17: 50 ug via INTRAVENOUS

## 2022-03-17 MED ORDER — GELATIN ABSORBABLE 12-7 MM EX MISC
CUTANEOUS | Status: AC | PRN
  Administered 2022-03-17: 1 via TOPICAL

## 2022-03-17 NOTE — Progress Notes (Signed)
Per Dr. Benay Spice: needs chemo education next week (FOLFOX + Nivolumumab) with lab and OV on 9/28 or 9/28. Scheduling message sent.

## 2022-03-17 NOTE — Procedures (Signed)
  Procedure:  US guided aspiration and core biopsy liver lesion min thin fluid sent for GS, C&S; core bx samples also obtained  Preprocedure diagnosis: The primary encounter diagnosis was Fever in adult. A diagnosis of Generalized weakness was also pertinent to this visit.  Postprocedure diagnosis: same EBL:    minimal Complications:   none immediate  See full dictation in [employer].  Dillard Cannon MD Main # [phone] Pager  [phone] Mobile [phone]

## 2022-03-17 NOTE — Progress Notes (Signed)
ID Brief Note   Chart reviewed  Remains afebrile with no new concerns  Planned for aspiration of necrotic hepatic metastasis to r/o abscess. Please send sample aspirate for aerobic/anaerobic cultures.  Continue iv cefazolin and po metronidazole  Planned for OP Chemotherapy noted   Dr Baxter Flattery covering this weekend and will follow cultures, new ID team to take over Monday   Rosiland Oz, MD Infectious Disease Physician [facility] [address] [place], [place] [address] Phone: [phone]  Fax: [phone]

## 2022-03-17 NOTE — Progress Notes (Signed)
PT Cancellation Note  Patient Details Name: [name] [name] MRN: [id number] DOB: 02-01-1961   Cancelled Treatment:    Reason Eval/Treat Not Completed: (P) Patient at procedure or test/unavailable. Will follow up as schedule allows which may be another day.  Coolidge Breeze, PT, DPT [facility] Office: [phone]  Coolidge Breeze 03/17/2022, 12:40 PM

## 2022-03-17 NOTE — Progress Notes (Signed)
IP PROGRESS NOTE  Subjective:   [name] [name] has no new complaint.  [name] [name] is scheduled for aspiration of the hepatic lesion today.    Objective: Vital signs in last 24 hours: Blood pressure (!) 151/95, pulse 98, temperature 97.9 F (36.6 C), temperature source Oral, resp. rate 16, height 5' 7"  (1.702 m), weight 157 lb 10.1 oz (71.5 kg), SpO2 95 %.  Intake/Output from previous day: 09/21 0701 - 09/22 0700 In: 320 [P.O.:120; IV Piggyback:200] Out: 1150 [Urine:1150]  Physical Exam:  HEENT: No thrush Abdomen: No hepatosplenomegaly, nontender Extremities: No right leg edema Musculoskeletal: Mild tenderness at the low right anterior chest wall   Portacath/PICC-without erythema  Lab Results: Recent Labs    03/15/22 0356 03/16/22 1147  WBC 6.7 7.9  HGB 9.0* 10.7*  HCT 29.6* 35.1*  PLT 205 254    BMET Recent Labs    03/15/22 0356 03/16/22 1147  NA 141 140  K 3.6 3.5  CL 107 107  CO2 25 24  GLUCOSE 89 99  BUN 9 8  CREATININE 0.72 0.81  CALCIUM 8.5* 9.2    Lab Results  Component Value Date   CEA1 287.0 (H) 02/11/2022   CEA 644.67 (H) 03/03/2022   [id number] 88 (H) 02/11/2022    Studies/Results: No results found.  Medications: I have reviewed the patient's current medications.  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 -02/11/2022 CTA chest-7.8 x 8.5 cm subtle hypoattenuating mass lesion in segment IV of the liver. -02/11/2022 CT abdomen/pelvis-8.7 x 7.8 or ill-defined irregular mass in the medial segment left liver, 4.5 x 3.4 cm necrotic mass lesion just cranial to the esophagogastric junction with metastatic lymphadenopathy in the abdomen and pelvis, irregular nodular peritoneal thickening in the lower abdomen and pelvis -02/11/2022 MRI abdomen-rim-enhancing likely necrotic mass or lymph node conglomerate centered  around the gastroesophageal junction measuring 5.0 x 3.9 cm, large rim-enhancing likely necrotic liver mass centered in the anterior left lobe of the liver measuring 7.8 x 7.2 cm, enlarged gastrohepatic ligament and retroperitoneal lymph nodes -Labs from 02/11/2022-AFP 2.4, CA 19.9 88, CEA 287 -Ultrasound-guided liver biopsy performed 02/15/2022-adenocarcinoma with extensive necrosis, CK7 positive, negative for CK20, CDX2, TTF-1, and PAX8, consistent with a pancreatic, lung, or upper gastrointestinal primary -PET 03/08/2022-hypermetabolic distal esophagus mass, hypermetabolic centrally necrotic hepatic mass, hypermetabolic periportal nodes, exophytic GE junction mass without hypermetabolism-likely benign lesion, no hypermetabolism in the pancreas head, bilateral hypermetabolic adrenal gland lesions, small hypermetabolic left parotid gland lesion -CTs 03/11/2022-no change from PET 03/08/2022, known distal esophagus mass-not visualized on unenhanced CT, large hepatic metastasis and upper abdominal nodal metastases, chronic pancreatitis with 4.3 cm pseudocyst in the pancreas head 2.  Myocardial infarction/cardiac arrest 01/31/2022 3.  Microcytic anemia -Labs from 02/11/2022-ferritin 151, iron 13, percent saturation 5% 4.  DVT/pulmonary embolism-bilateral lower extremity DVTs 01/10/2022, pulmonary emboli on chest CT 01/31/2022-apixaban 5.  PAD status post AKA in 2021 6.  RA 7.  Bilateral pleural effusions -Ultrasound-guided thoracentesis performed on 02/11/2022.  Cytology negative for malignancy. 8.  COPD 9.  Admission 03/11/2022 with E. coli bacteremia  [name] [name] appears stable and remains afebrile while on cefazolin.  [name] [name] will undergo aspiration of the necrotic appearing liver lesion today.  The source of E. coli bacteremia is unclear, but [name] [name] could have developed a liver abscess.  [name] [name] has been diagnosed with metastatic esophagus cancer.  Molecular testing on the diagnostic liver biopsy is pending.  The plan  is to initiate systemic therapy within the next few weeks.  I recommend treatment with FOLFOX/nivolumab. I discussed potential toxicities associated FOLFOX/nivolumab with [name] [name] today.  [name] [name] understands potential for nausea/vomiting, mucositis, alopecia, diarrhea, hematologic toxicity, infection, and bleeding.  We discussed the sun sensitivity, rash, hyperpigmentation, and hand/foot syndrome associated with 5-fluorouracil.  We reviewed the allergic reaction and various types of neuropathy seen with oxaliplatin.  We discussed the rash, diarrhea, and various autoimmune toxicities seen with PD-L1 inhibitors.  [name] [name] agrees to proceed.  [name] [name] will attend a chemotherapy teaching class.  I will schedule outpatient follow-up at the Cancer center for later next week with the plan to begin chemotherapy during the week of 03/27/2022.  [name] [name] [name] [name] appears stable for discharge from an oncology standpoint.  Recommendations: Antibiotics per the medical service IR aspiration of the necrotic liver metastasis today Resume anticoagulation therapy as recommended by interventional radiology Outpatient follow-up will be scheduled at the Cancer center to begin systemic therapy for treatment of metastatic esophagus cancer. Please call oncology as needed       LOS: 6 days   Betsy Coder, MD   03/17/2022, 6:45 AM

## 2022-03-17 NOTE — Progress Notes (Signed)
OT Cancellation Note  Patient Details Name: [name] [name] MRN: [id number] DOB: 1960-07-07   Cancelled Treatment:    Reason Eval/Treat Not Completed: Other (comment) (Pt still deferring therapy until his LE prosthesis is brought in from home.)  Leota Sauers, OTR/L 03/17/2022, 5:21 PM

## 2022-03-17 NOTE — Progress Notes (Signed)
Dr. Pietro Cassis notified of tele call at this time of 5 beat run ov vtach. Pt stable at this time. No new orders. Will continue to monitor pt.

## 2022-03-17 NOTE — Progress Notes (Signed)
PROGRESS NOTE  [name] [name]  DOB: 10-01-1960  PCP: Angelique Blonder, DO EHU:[id number]  DOA: 03/11/2022  LOS: 6 days  Hospital Day: 7  Brief narrative: [name] [name] is a 61 y.o. male with PMH significant for HTN, HLD, CAD/MI 01/2022, aortic valve mass, PAD s/p left AKA 2021, prior endarterectomies and extensive vascular surgery in 2021, DVT, GERD who was recently diagnosed of pancreatic mass in 02/11/2022 concerning for possible metastatic esophageal carcinoma. 9/16, patient was brought in with complaint of altered mental status  In the ED, patient had a fever of 103.3, hypotensive to 90s Labs with WC count of 5.3, hemoglobin 8.5, creatinine 1 Bladder scan showed 500 cc in and out cath was performed CT head showed old infarcts  CT chest abdomen pelvis showed no new changes other than known large hepatic metastases and upper abdominal nodal mets with a 4.3 pseudocyst at the pancreatic head Admitted to hospitalist service Blood culture obtained on admission grew E. Coli Oncology and IR consulted. The source of bacteremia is suspected to be necrotic liver mass and hence IR plans to obtain sample and put a drain on 9/22 after 5 days of aspirin and Plavix washout.  Subjective: Patient was seen and examined this morning.   Lying down in bed.  Not in distress.  No new symptoms. Waiting for liver biopsy/sampling by IR today.  Assessment and plan: Sepsis POA E. coli bacteremia Currently on IV Ancef The source of bacteremia is suspected to be necrotic liver mass and hence IR plans to obtain sample today.  Depending on the culture growth, may need drain placement as well at a later date Aspirin and Plavix on hold. Per ID recommendation, currently on IV Ancef and oral Flagyl. Recent Labs  Lab 03/11/22 1930 03/11/22 2113 03/12/22 0349 03/13/22 0355 03/14/22 0415 03/15/22 0356 03/16/22 1147  WBC 5.3  --  4.6 5.5 5.9 6.7 7.9  LATICACIDVEN 1.5 1.6  --   --   --   --   --   PROCALCITON   --   --  1.03  --   --   --   --    Metastatic adenocarcinoma, likely esophagus primary  appreciate input by Dr. Benay Spice.  Noted a plan to initiate systemic therapy as an outpatient.   Acute urinary retention Resolved after in and out-try to avoid recatheterization.  Essential hypertension Currently controlled on amlodipine 10 mg daily, chlorthalidone 25 mg daily.     Prior fractured sternum and ribs with radiculopathy Continue oxycodone 10 mg every 4 hours as needed, gabapentin 100 milligram daily as needed Continue diclofenac patch and tylenol for pain Pain is manageable at this time   Prior pulmonary embolism/DVT Recurrence on 02/27/2022 Unclear if patient is compliant on meds-last admission, he was discharged on apixaban 5 mg twice daily. Currently on full dose lovenox until biopsy procedure can be accomplished   CABG status post stent 2020  PAD with stenting status post AKA left side 04/2020 Previously on aspirin, Plavix, statin.   Aspirin Plavix currently on hold.  Continue Lipitor Continue outpatient follow-up.   Goals of care   Code Status: Full Code   Mobility: Currently remains in bed.  He needs a prosthesis on his left leg.  Patient states someone is bringing it from home today.  Needs PT eval after that  Skin assessment:     Nutritional status:  Body mass index is 24.69 kg/m.          Diet:  Diet Order  Diet NPO time specified Except for: Sips with Meds  Diet effective midnight                   DVT prophylaxis:     Antimicrobials: IV Ancef, oral Flagyl Fluid: None Consultants: ID, IR, oncology Family Communication: None at bedside  Status is: Inpatient  Continue in-hospital care because: Pending liver mass sampling today.  Needs culture sent. May need drain placement in next few days Level of care: Telemetry   Dispo: The patient is from: Home              Anticipated d/c is to: Pending clinical course              Patient  currently is not medically stable to d/c.   Difficult to place patient No     Infusions:    ceFAZolin (ANCEF) IV 2 g (03/17/22 0601)    Scheduled Meds:  amLODipine  10 mg Oral Daily   atorvastatin  80 mg Oral q1800   Chlorhexidine Gluconate Cloth  6 each Topical Daily   chlorthalidone  25 mg Oral Daily   diclofenac  1 patch Transdermal BID   DULoxetine  30 mg Oral Daily   feeding supplement  237 mL Oral BID BM   metroNIDAZOLE  500 mg Oral Q12H   sodium chloride flush  10-40 mL Intracatheter Q12H   sodium chloride flush  3 mL Intravenous Q12H    PRN meds: acetaminophen **OR** acetaminophen, gabapentin, oxyCODONE-acetaminophen **AND** oxyCODONE, prochlorperazine, senna-docusate   Antimicrobials: Anti-infectives (From admission, onward)    Start     Dose/Rate Route Frequency Ordered Stop   03/15/22 0600  ceFAZolin (ANCEF) IVPB 2g/100 mL premix        2 g 200 mL/hr over 30 Minutes Intravenous Every 8 hours 03/14/22 1158     03/13/22 1330  metroNIDAZOLE (FLAGYL) tablet 500 mg        500 mg Oral Every 12 hours 03/13/22 1316     03/12/22 1500  cefTRIAXone (ROCEPHIN) 2 g in sodium chloride 0.9 % 100 mL IVPB  Status:  Discontinued        2 g 200 mL/hr over 30 Minutes Intravenous Daily 03/12/22 1418 03/14/22 1158   03/12/22 1000  metroNIDAZOLE (FLAGYL) IVPB 500 mg  Status:  Discontinued        500 mg 100 mL/hr over 60 Minutes Intravenous Every 12 hours 03/12/22 0042 03/12/22 1057   03/12/22 0800  vancomycin (VANCOREADY) IVPB 750 mg/150 mL  Status:  Discontinued        750 mg 150 mL/hr over 60 Minutes Intravenous Every 12 hours 03/12/22 0341 03/12/22 1057   03/12/22 0600  ceFEPIme (MAXIPIME) 2 g in sodium chloride 0.9 % 100 mL IVPB  Status:  Discontinued        2 g 200 mL/hr over 30 Minutes Intravenous Every 8 hours 03/12/22 0337 03/12/22 1418   03/11/22 2115  vancomycin (VANCOREADY) IVPB 1500 mg/300 mL        1,500 mg 150 mL/hr over 120 Minutes Intravenous  Once 03/11/22 2101  03/12/22 0003   03/11/22 2030  ceFEPIme (MAXIPIME) 2 g in sodium chloride 0.9 % 100 mL IVPB        2 g 200 mL/hr over 30 Minutes Intravenous  Once 03/11/22 2023 03/12/22 0002   03/11/22 2030  metroNIDAZOLE (FLAGYL) IVPB 500 mg        500 mg 100 mL/hr over 60 Minutes Intravenous  Once 03/11/22 2023 03/12/22 0002  03/11/22 2030  vancomycin (VANCOCIN) IVPB 1000 mg/200 mL premix  Status:  Discontinued        1,000 mg 200 mL/hr over 60 Minutes Intravenous  Once 03/11/22 2023 03/11/22 2101       Objective: Vitals:   03/17/22 1300 03/17/22 1302  BP: 108/67   Pulse: 84 93  Resp: 20 19  Temp:    SpO2: 96% 96%    Intake/Output Summary (Last 24 hours) at 03/17/2022 1333 Last data filed at 03/17/2022 1000 Gross per 24 hour  Intake 320 ml  Output 1250 ml  Net -930 ml   Filed Weights   03/13/22 0440 03/15/22 0500 03/16/22 0500  Weight: 72.1 kg 71.4 kg 71.5 kg   Weight change:  Body mass index is 24.69 kg/m.   Physical Exam: General exam: Pleasant, middle-aged Skin: No rashes, lesions or ulcers. HEENT: Atraumatic, normocephalic, no obvious bleeding Lungs: Clear to auscultation bilaterally CVS: Regular rate and rhythm, no murmur GI/Abd soft, tenderness present in right upper quadrant and epigastrium, nondistended, bowel sound present CNS: Alert, awake, oriented x3 Psychiatry: Mood appropriate. Extremities: No pedal edema on the right.  Left prior AKA status  Data Review: I have personally reviewed the laboratory data and studies available.  F/u labs  Unresulted Labs (From admission, onward)     Start     Ordered   03/17/22 1327  Aerobic/Anaerobic Culture w Gram Stain (surgical/deep wound)  Once,   R       Question:  Patient immune status  Answer:  Normal   03/17/22 1327            Signed, Terrilee Croak, MD [facility] 03/17/2022

## 2022-03-17 NOTE — Progress Notes (Addendum)
MEDICATION-RELATED CONSULT NOTE   IR Procedure Consult - Anticoagulant/Antiplatelet PTA/Inpatient Med List Review by Pharmacist    Procedure: US-guided aspiration and core biopsy liver lesion    Completed: 03/17/22 13:28    Antithrombotic medications on inpatient or PTA profile prior to procedure:    PTA: apixaban 5 mg BID, last taken 03/11/22 at 0400, clopidogrel 75 mg daily, last dose not documented Inpatient: Lovenox 70 mg (1 mg/kg) [place] q12h, last given 9/21 at 2234.   Reviewed IR aniticoagulation restart algorithm.  Also contacted Dr. Vernard Gambles to discuss options.   He indicated that from his standpoint, should be safe to resume apixaban 3 hours post-procedure.  Plan: Restart apixaban 5 mg po BID today at 5pm Resume clopidogrel 75 mg po daily starting tomorrow.  Clayburn Pert, PharmD, [place] [phone] 03/17/2022  2:32 PM

## 2022-03-17 NOTE — Progress Notes (Signed)
PT Cancellation Note  Patient Details Name: [name] [name] MRN: [id number] DOB: May 30, 1961   Cancelled Treatment:    Reason Eval/Treat Not Completed: Patient declined, no reason specified. Pt nauseated, RN providing medication during attempt. Pt still waiting on family to bring prosthesis; declined any other types of mobility. Reports using LLE prosthetic and RW at baseline. Will follow up as pt status and schedule allows.   Coolidge Breeze, PT, DPT [facility] Office: [phone] Coolidge Breeze 03/17/2022, 11:11 AM

## 2022-03-18 DIAGNOSIS — R6510 Systemic inflammatory response syndrome (SIRS) of non-infectious origin without acute organ dysfunction: Secondary | ICD-10-CM | POA: Diagnosis not present

## 2022-03-18 MED ORDER — METRONIDAZOLE 500 MG PO TABS: 500.0000 mg | ORAL_TABLET | Freq: Two times a day (BID) | ORAL | 0 refills | Status: AC

## 2022-03-18 MED ORDER — LEVOFLOXACIN 500 MG PO TABS: 500.0000 mg | ORAL_TABLET | Freq: Every day | ORAL | 0 refills | Status: AC

## 2022-03-18 MED ORDER — LABETALOL HCL 5 MG/ML IV SOLN: 10.0000 mg | INTRAVENOUS | Status: DC | PRN

## 2022-03-18 MED ORDER — ENSURE ENLIVE PO LIQD: 237.0000 mL | Freq: Two times a day (BID) | ORAL | 12 refills | Status: DC

## 2022-03-18 MED ORDER — HEPARIN SOD (PORK) LOCK FLUSH 100 UNIT/ML IV SOLN: 500.0000 [IU] | INTRAVENOUS | Status: DC | PRN

## 2022-03-18 MED ORDER — LEVOFLOXACIN 750 MG PO TABS
750.0000 mg | ORAL_TABLET | Freq: Every day | ORAL | Status: DC
  Administered 2022-03-18: 750 mg via ORAL
  Filled 2022-03-18: qty 1

## 2022-03-18 NOTE — Progress Notes (Signed)
Pt being discharged to home at this time. Prior to DC, Pt Chest port was by IV nurse, Pt was removed from tele and given DC paperwork regarding medications, appointments, and condition. Pt verbalized understanding an stated no concerns at this time. Pt left in vehicle driven by sons and was stable at time of DC.

## 2022-03-18 NOTE — Evaluation (Signed)
Occupational Therapy Evaluation Patient Details Name: [name] [name] MRN: [id number] DOB: 1961/02/11 Today's Date: 03/18/2022   History of Present Illness 61 y.o. male presents to [facility] hospital on 9/4 with chest pain and then had episode of AMS in ED. MRI showed Multiple subcentimeter foci of diffusion abnormality involving supratentorial cerebral white matter as above, suspicious for small evolving acute to early subacute ischemic infarcts as well as multiple remote infarcts. Neurology felt MRI consistent with artifact from old lesions. PMH includes aortic valve mass, HTN, Lupus, PVD, PNA, RA, L AKA. Recent cardiac arrest, STEMI, and PE. Rib and sternal fx's from CPR.  Rrecent esophageal mass and liver mass in setting of metastatic adenocarcinoma.   Clinical Impression   [name] is a 61 year old man who presents with above medical history. On evaluation he is at this baseline. He reports no new deficits or significant weakness. He is reliant on DME and his prosthesis. With PT he demonstrated ability to transfer and use wc to mobilize. With OT he demonstrates his baseline UB strength - that is limited by poor shoulder ROM and arthritic changes in hands. For ADLs he dresses at bed level and uses DME and prosthesis for ambulating to bathroom for toileting and bathing. Patient reports no OT needs.      Recommendations for follow up therapy are one component of a multi-disciplinary discharge planning process, led by the attending physician.  Recommendations may be updated based on patient status, additional functional criteria and insurance authorization.   Follow Up Recommendations  No OT follow up    Assistance Recommended at Discharge Intermittent Supervision/Assistance  Patient can return home with the following Assistance with cooking/housework;Assist for transportation    Functional Status Assessment  Patient has not had a recent decline in their functional status  Equipment  Recommendations  None recommended by OT    Recommendations for Other Services       Precautions / Restrictions Precautions Precautions: Fall Precaution Comments: L AKA, prosthesis Restrictions Weight Bearing Restrictions: No      Mobility Bed Mobility               General bed mobility comments: up in chair    Transfers                          Balance Overall balance assessment: Needs assistance Sitting-balance support: No upper extremity supported, Feet supported Sitting balance-Leahy Scale: Good                                     ADL either performed or assessed with clinical judgement   ADL Overall ADL's : At baseline                                             Vision Patient Visual Report: No change from baseline       Perception     Praxis      Pertinent Vitals/Pain Pain Assessment Pain Assessment: No/denies pain     Hand Dominance Left   Extremity/Trunk Assessment Upper Extremity Assessment Upper Extremity Assessment: RUE deficits/detail;LUE deficits/detail RUE Deficits / Details: decreased shoulder ROM 3-/5, elbow 5/5, wrist, 5/5, grip functional. arthritic changes in hand RUE Sensation: WNL RUE Coordination: decreased fine motor LUE Deficits /  Details: decreased shoulder ROM 2+/5, elbow 5/5, wrist, 5/5, grip functional. arthritic changes in hand LUE Sensation: WNL LUE Coordination: decreased fine motor   Lower Extremity Assessment Lower Extremity Assessment: Defer to PT evaluation RLE Deficits / Details: Functional ROM and strength grossly 5/5 hip/knee/ankle RLE Sensation: WNL LLE Deficits / Details: AKA   Cervical / Trunk Assessment Cervical / Trunk Assessment: Normal   Communication Communication Communication: No difficulties   Cognition Arousal/Alertness: Awake/alert Behavior During Therapy: WFL for tasks assessed/performed Overall Cognitive Status: Within Functional Limits for  tasks assessed                                       General Comments       Exercises     Shoulder Instructions      Home Living Family/patient expects to be discharged to:: Private residence Living Arrangements: Non-relatives/Friends Available Help at Discharge: Friend(s);Available 24 hours/day Type of Home: House Home Access: Ramped entrance     Home Layout: One level     Bathroom Shower/Tub: Teacher, early years/pre: Standard Bathroom Accessibility: Yes   Home Equipment: Tub bench;Grab bars - tub/shower;Rolling Walker (2 wheels);Wheelchair - manual;BSC/3in1;Hand held shower head   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      Prior Functioning/Environment Prior Level of Function : Needs assist             Mobility Comments: pt reports ambulating some with RW butr primarily utlizing a wheelchair for houshold mobility over the last 2 months ADLs Comments: friends assist with cooking and cleaning, can help with dressing but usually is IND        OT Problem List:        OT Treatment/Interventions:      OT Goals(Current goals can be found in the care plan section) Acute Rehab OT Goals OT Goal Formulation: All assessment and education complete, DC therapy  OT Frequency:      Co-evaluation              AM-PAC OT "6 Clicks" Daily Activity     Outcome Measure Help from another person eating meals?: None Help from another person taking care of personal grooming?: A Little Help from another person toileting, which includes using toliet, bedpan, or urinal?: A Little Help from another person bathing (including washing, rinsing, drying)?: A Little Help from another person to put on and taking off regular upper body clothing?: A Little Help from another person to put on and taking off regular lower body clothing?: A Little 6 Click Score: 19   End of Session Nurse Communication: Mobility status  Activity Tolerance: Patient  tolerated treatment well Patient left: in chair;with chair alarm set  OT Visit Diagnosis: Other abnormalities of gait and mobility (R26.89)                Time: 1610-9604 OT Time Calculation (min): 8 min Charges:  OT General Charges $OT Visit: 1 Visit OT Evaluation $OT Eval Low Complexity: 1 Low  Gustavo Lah, OTR/L [place]  Office [phone]   Lenward Chancellor 03/18/2022, 1:03 PM

## 2022-03-18 NOTE — Progress Notes (Signed)
BP has been elevated x two readings. 5am-7am Oncall provider contacted, see new orders and parameters.  03/18/22 0456  Vitals  Temp 97.8 F (36.6 C)  Temp Source Oral  BP (!) 153/108  MAP (mmHg) 122  BP Location Left Arm  BP Method Automatic  Patient Position (if appropriate) Lying  Pulse Rate (!) 102  Pulse Rate Source Monitor  Resp 16  MEWS COLOR  MEWS Score Color Green  Oxygen Therapy  SpO2 96 %  O2 Device Room Air  MEWS Score  MEWS Temp 0  MEWS Systolic 0  MEWS Pulse 1  MEWS RR 0  MEWS LOC 0  MEWS Score 1

## 2022-03-18 NOTE — Progress Notes (Signed)
ID PROGRESS NOTE  Patient underwent liver aspirate, which led to biopsy, IR was not able to place drain since not c/w abscess. Tissue cx sent. Remains afebrile. Being treated for E.coli bacteremia, currently day 8 abtx  Micro:03/11/22 blood cx Escherichia coli      MIC    AMPICILLIN >=32 RESIST... Resistant    AMPICILLIN/SULBACTAM 16 INTERMED... Intermediate    CEFAZOLIN <=4 SENSITIVE  Sensitive    CEFEPIME <=0.12 SENS... Sensitive    CEFTAZIDIME <=1 SENSITIVE  Sensitive    CEFTRIAXONE <=0.25 SENS... Sensitive    CIPROFLOXACIN <=0.25 SENS... Sensitive    GENTAMICIN >=16 RESIST... Resistant    IMIPENEM <=0.25 SENS... Sensitive    PIP/TAZO <=4 SENSITIVE  Sensitive    TRIMETH/SULFA >=320 RESIS... Resistant     Plan to finish out course with 7 days of levofloxacin plus metronidazole '500mg'$  bid. -to complete 14 day course of therapy. Will follow up on liver cx to see if any changes will be needed as outpatient.  [name] [name] [facility] [phone]

## 2022-03-18 NOTE — Evaluation (Signed)
Physical Therapy Evaluation Patient Details Name: [name] [name] MRN: [id number] DOB: [birth year] Today's Date: 03/18/2022  History of Present Illness  61 y.o. male presents to [facility] hospital on 9/4 with chest pain and then had episode of AMS in ED. MRI showed Multiple subcentimeter foci of diffusion abnormality involving supratentorial cerebral white matter as above, suspicious for small evolving acute to early subacute ischemic infarcts as well as multiple remote infarcts. Neurology felt MRI consistent with artifact from old lesions. PMH includes aortic valve mass, HTN, Lupus, PVD, PNA, RA, L AKA. Recent cardiac arrest, STEMI, and PE. Rib and sternal fx's from CPR.  Rrecent esophageal mass and liver mass in setting of metastatic adenocarcinoma.   Clinical Impression  Pt admitted with above diagnosis. Pt currently with functional limitations due to the deficits listed below (see PT Problem List). Pt supervision for bed mobility and min guard for transfers from bed to chair and chair to [place]. Pt demonstrated WC mobility with supervision/verbal cuing; per pt report, this is his baseline. Reviewed BLE exercises verbally and pt verbalized understanding. Pt will benefit from skilled PT to increase their independence and safety with mobility to allow discharge to the venue listed below.          Recommendations for follow up therapy are one component of a multi-disciplinary discharge planning process, led by the attending physician.  Recommendations may be updated based on patient status, additional functional criteria and insurance authorization.  Follow Up Recommendations Home health PT      Assistance Recommended at Discharge Intermittent Supervision/Assistance  Patient can return home with the following  A little help with walking and/or transfers;Assist for transportation;A little help with bathing/dressing/bathroom;Assistance with cooking/housework;Help with stairs or ramp for entrance    Equipment  Recommendations None recommended by PT  Recommendations for Other Services       Functional Status Assessment Patient has had a recent decline in their functional status and demonstrates the ability to make significant improvements in function in a reasonable and predictable amount of time.     Precautions / Restrictions Precautions Precautions: Fall Precaution Comments: L AKA, prosthesis Restrictions Weight Bearing Restrictions: No      Mobility  Bed Mobility Overal bed mobility: Needs Assistance Bed Mobility: Supine to Sit     Supine to sit: HOB elevated, Supervision     General bed mobility comments: Supervision, increased time    Transfers Overall transfer level: Needs assistance Equipment used: None Transfers: Bed to chair/wheelchair/BSC       Squat pivot transfers: Min guard    Lateral/Scoot Transfers: Min guard General transfer comment: Min guard, no physical assist, x3. Pt demonstrated safe transfer setup and completion. While in [facility] demonstrated forward, backward, and turning in tight spaces. Utilized brakes appropriately.    Ambulation/Gait                  Hotel manager mobility: Yes Wheelchair propulsion: Both upper extremities, Right lower extremity Wheelchair parts: Supervision/cueing Distance: 5 Wheelchair Assistance Details (indicate cue type and reason): Use of brakes  Modified Rankin (Stroke Patients Only)       Balance Overall balance assessment: Needs assistance Sitting-balance support: No upper extremity supported, Feet supported Sitting balance-Leahy Scale: Good  Pertinent Vitals/Pain Pain Assessment Pain Assessment: 0-10 Pain Score: 7  Faces Pain Scale: Hurts a little bit Pain Location: ribs and back Pain Descriptors / Indicators: Guarding, Tender Pain Intervention(s): Limited activity within patient's  tolerance, Monitored during session, Repositioned    Home Living Family/patient expects to be discharged to:: Private residence Living Arrangements: Non-relatives/Friends Available Help at Discharge: Friend(s);Available 24 hours/day Type of Home: House Home Access: Ramped entrance       Home Layout: One level Home Equipment: Tub bench;Grab bars - tub/shower;Rolling Walker (2 wheels);Wheelchair - manual;BSC/3in1;Hand held shower head 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    Prior Function Prior Level of Function : Needs assist             Mobility Comments: pt reports ambulating some with RW butr primarily utlizing a wheelchair for houshold mobility over the last 2 months ADLs Comments: friends assist with cooking and cleaning, can help with dressing but usually is IND     Hand Dominance   Dominant Hand: Left    Extremity/Trunk Assessment   Upper Extremity Assessment Upper Extremity Assessment: Defer to OT evaluation    Lower Extremity Assessment Lower Extremity Assessment: Generalized weakness;RLE deficits/detail;LLE deficits/detail RLE Deficits / Details: Functional ROM and strength grossly 5/5 hip/knee/ankle RLE Sensation: WNL LLE Deficits / Details: AKA    Cervical / Trunk Assessment Cervical / Trunk Assessment: Kyphotic  Communication   Communication: No difficulties  Cognition Arousal/Alertness: Awake/alert Behavior During Therapy: WFL for tasks assessed/performed Overall Cognitive Status: Within Functional Limits for tasks assessed                                          General Comments      Exercises     Assessment/Plan    PT Assessment Patient needs continued PT services  PT Problem List Decreased strength;Decreased range of motion;Decreased activity tolerance;Decreased balance;Decreased mobility;Decreased coordination;Pain       PT Treatment Interventions DME instruction;Gait training;Functional mobility  training;Therapeutic activities;Therapeutic exercise;Balance training;Neuromuscular re-education;Patient/family education;Wheelchair mobility training    PT Goals (Current goals can be found in the Care Plan section)  Acute Rehab PT Goals Patient Stated Goal: To go home PT Goal Formulation: With patient Time For Goal Achievement: 04/01/22 Potential to Achieve Goals: Good Additional Goals Additional Goal #1: Pt will transfer to [facility] with supervision and then manage WC with modified independence for distance >/=8f.    Frequency Min 3X/week     Co-evaluation               AM-PAC PT "6 Clicks" Mobility  Outcome Measure Help needed turning from your back to your side while in a flat bed without using bedrails?: None Help needed moving from lying on your back to sitting on the side of a flat bed without using bedrails?: A Little Help needed moving to and from a bed to a chair (including a wheelchair)?: A Little Help needed standing up from a chair using your arms (e.g., wheelchair or bedside chair)?: A Little Help needed to walk in hospital room?: Total Help needed climbing 3-5 steps with a railing? : Total 6 Click Score: 15    End of Session Equipment Utilized During Treatment: Other (comment);Gait belt (WC) Activity Tolerance: Patient tolerated treatment well Patient left: in chair;with call bell/phone within reach;with chair alarm set Nurse Communication: Mobility status PT Visit Diagnosis: Other abnormalities of gait and mobility (R26.89);Pain Pain -  part of body:  (ribs and abdomen)    Time: 1100-1130 PT Time Calculation (min) (ACUTE ONLY): 30 min   Charges:   PT Evaluation $PT Eval Low Complexity: 1 Low PT Treatments $Wheel Chair Management: 8-22 mins        Coolidge Breeze, PT, DPT [facility] Department Office: [phone]  Coolidge Breeze 03/18/2022, 11:37 AM

## 2022-03-18 NOTE — Discharge Summary (Signed)
Physician Discharge Summary  [name] [name] UXN:[id number] DOB: 1960-12-17 DOA: 03/11/2022  PCP: Angelique Blonder, DO  Admit date: 03/11/2022 Discharge date: 03/18/2022  Admitted From: Home Discharge disposition: Home with home health PT  Recommendations at discharge:  7 more days of oral Levaquin and Flagyl Follow-up with oncology as an outpatient  Brief narrative: [name] [name] is a 61 y.o. male with PMH significant for HTN, HLD, CAD/MI 01/2022, aortic valve mass, PAD s/p left AKA 2021, prior endarterectomies and extensive vascular surgery in 2021, DVT, GERD who was recently diagnosed of pancreatic mass in 02/11/2022 concerning for possible metastatic esophageal carcinoma. 9/16, patient was brought in with complaint of altered mental status  In the ED, patient had a fever of 103.3, hypotensive to 90s Labs with WC count of 5.3, hemoglobin 8.5, creatinine 1 Bladder scan showed 500 cc in and out cath was performed CT head showed old infarcts  CT chest abdomen pelvis showed no new changes other than known large hepatic metastases and upper abdominal nodal mets with a 4.3 pseudocyst at the pancreatic head Admitted to hospitalist service Blood culture obtained on admission grew E. Coli Oncology and IR consulted.  Subjective: Patient was seen and examined this morning.   Lying down in bed.  Not in distress.  No new symptoms. Underwent liver biopsy/sampling by IR yesterday.  Hospital course: Sepsis POA E. coli bacteremia Patient was empirically covered on IV Ancef.  The source of bacteremia is suspected to be necrotic liver mass and hence patient underwent liver biopsy/sampling on 9/22.   Per IR, given the lack of significant purulent material on aspiration, no drain catheter was placed. Per ID recommendation this morning, will discharge the patient on a 7-day course of levofloxacin plus Flagyl 5 mg twice daily to complete a 14-day course. Recent Labs  Lab 03/11/22 1930 03/11/22 2113  03/12/22 0349 03/13/22 0355 03/14/22 0415 03/15/22 0356 03/16/22 1147  WBC 5.3  --  4.6 5.5 5.9 6.7 7.9  LATICACIDVEN 1.5 1.6  --   --   --   --   --   PROCALCITON  --   --  1.03  --   --   --   --    Metastatic adenocarcinoma, likely esophagus primary  appreciate input by Dr. Benay Spice.  Noted a plan to initiate systemic therapy as an outpatient.   Acute urinary retention Resolved after in and out-try to avoid recatheterization.  Essential hypertension Currently controlled on amlodipine 10 mg daily, chlorthalidone 25 mg daily.     Prior fractured sternum and ribs with radiculopathy Continue oxycodone 10 mg every 4 hours as needed, gabapentin 100 milligram daily as needed Continue diclofenac patch and tylenol for pain Pain is manageable at this time   Prior pulmonary embolism/DVT Recurrence on 02/27/2022 Unclear if patient is compliant on meds-last admission, he was discharged on apixaban 5 mg twice daily.  Continue the same. Currently on full dose lovenox until biopsy procedure can be accomplished   CABG status post stent 2020  PAD with stenting status post AKA left side 04/2020 Previously on Eliquis, Plavix, statin.   Continue all. Continue outpatient follow-up.   Wounds:  - Wound / Incision (Open or Dehisced) 02/19/19 Non-pressure wound Toe (Comment  which one) Left (Active)  Date First Assessed/Time First Assessed: 02/19/19 2003   Wound Type: Non-pressure wound  Location: (c) Toe (Comment  which one)  Location Orientation: Left  Present on Admission: Yes    Assessments 02/20/2019  8:20 AM 02/27/2019  5:29 AM  Dressing Status None None  Site / Wound Assessment Dry;Pink Clean;Dry  Drainage Amount None None     No associated orders.     Wound / Incision (Open or Dehisced) 02/19/19 Non-pressure wound Leg Right;Anterior;Lower (Active)  Date First Assessed/Time First Assessed: 02/19/19 2000   Wound Type: Non-pressure wound  Location: Leg  Location Orientation:  Right;Anterior;Lower  Present on Admission: Yes    Assessments 02/20/2019  8:20 AM 02/27/2019  5:29 AM  Dressing Type -- Non adherent  Dressing Status -- Clean;Dry;Intact  Site / Wound Assessment Black;Brown Dressing in place / Unable to assess  Peri-wound Assessment Pink Pink     No associated orders.     Incision (Closed) 01/22/20 Groin (Active)  Date First Assessed/Time First Assessed: 01/22/20 2341   Location: Groin    Assessments 01/22/2020 11:57 PM 01/24/2020  8:05 AM  Dressing Type Gauze (Comment);Transparent dressing Gauze (Comment)  Dressing Clean;Dry;Intact Clean;Dry;Intact  Site / Wound Assessment -- Dressing in place / Unable to assess  Drainage Amount None None     No associated orders.     Incision (Closed) 05/01/20 Leg Left (Active)  Date First Assessed/Time First Assessed: 05/01/20 1330   Location: Leg  Location Orientation: Left    Assessments 05/01/2020  2:05 PM 05/11/2020  8:00 AM  Dressing Type Adhesive bandage None  Dressing Intact;Old drainage (marked) --  Site / Wound Assessment Dressing in place / Unable to assess Clean;Dry  Margins -- Attached edges (approximated)  Closure -- Staples  Drainage Amount Minimal None  Drainage Description Sanguineous --     No associated orders.     Incision (Closed) 05/11/20 Leg (Active)  Date First Assessed/Time First Assessed: 05/11/20 1121   Location: Leg    Assessments 05/11/2020 11:30 AM 05/14/2020 10:23 AM  Dressing Type Gauze (Comment);Abdominal pads;Compression wrap Gauze (Comment)  Dressing Clean;Dry;Intact Clean;Dry;Intact  Site / Wound Assessment Dressing in place / Unable to assess Dressing in place / Unable to assess  Margins Attached edges (approximated) --  Drainage Amount None None  Drainage Description No odor No odor     No associated orders.     Pressure Injury 01/31/22 Buttocks Right Stage 1 -  Intact skin with non-blanchable redness of a localized area usually over a bony prominence. (Active)  Date  First Assessed/Time First Assessed: 01/31/22 1615   Location: Buttocks  Location Orientation: Right  Staging: Stage 1 -  Intact skin with non-blanchable redness of a localized area usually over a bony prominence.  Present on Admission: Yes    Assessments 01/31/2022  4:15 PM 02/17/2022  8:37 AM  Dressing Type Foam - Lift dressing to assess site every shift Foam - Lift dressing to assess site every shift  Dressing Clean, Dry, Intact Clean, Dry, Intact  Dressing Change Frequency Every 3 days PRN  Site / Wound Assessment -- Clean;Dry;Pink  Wound Length (cm) 5.5 cm --  Wound Width (cm) 5.5 cm --  Wound Depth (cm) 0 cm --  Wound Surface Area (cm^2) 30.25 cm^2 --  Wound Volume (cm^3) 0 cm^3 --  Tunneling (cm) 0 --  Undermining (cm) 0 --  Margins -- Attached edges (approximated)  Drainage Amount -- None     No associated orders.     Wound / Incision (Open or Dehisced) 02/15/22 Puncture Abdomen Right;Upper (Active)  Date First Assessed/Time First Assessed: 02/15/22 1439   Wound Type: Puncture  Location: Abdomen  Location Orientation: Right;Upper  Present on Admission: No    Assessments 02/15/2022  2:39 PM 02/17/2022  8:37 AM  Dressing Type Other (Comment) --  Dressing Changed New --  Dressing Status Clean, Dry, Intact --  Dressing Change Frequency PRN --  Site / Wound Assessment Clean;Dry Clean;Dry  Peri-wound Assessment Intact Intact  Drainage Amount None None  Treatment Cleansed --     No associated orders.    Discharge Exam:   Vitals:   03/18/22 0456 03/18/22 0500 03/18/22 0650 03/18/22 1335  BP: (!) 153/108  135/87 116/84  Pulse: (!) 102  73 70  Resp: 16   18  Temp: 97.8 F (36.6 C)   98.1 F (36.7 C)  TempSrc: Oral   Oral  SpO2: 96%   98%  Weight:  64.4 kg    Height:        Body mass index is 22.24 kg/m.   General exam: Pleasant, middle-aged Skin: No rashes, lesions or ulcers. HEENT: Atraumatic, normocephalic, no obvious bleeding Lungs: Clear to auscultation  bilaterally CVS: Regular rate and rhythm, no murmur GI/Abd soft, improved tenderness in epigastrium and right upper quadrant, nondistended, bowel sound present CNS: Alert, awake, oriented x3 Psychiatry: Mood appropriate. Extremities: No pedal edema on the right.  Left prior AKA status  Data Review: I have personally reviewed the laboratory data and studies available.  Follow ups:    Follow-up Information     [facility] Follow up.   Why: agency will provide home health physical and occupational therapy        Angelique Blonder, DO Follow up.   Contact information: [address] [place] [address] [phone]         Jerline Pain, MD .   Specialty: Cardiology Contact information: [phone] [address] [place] [address] [phone]                 Discharge Instructions:   Discharge Instructions     Call MD for:  difficulty breathing, headache or visual disturbances   Complete by: As directed    Call MD for:  extreme fatigue   Complete by: As directed    Call MD for:  hives   Complete by: As directed    Call MD for:  persistant dizziness or light-headedness   Complete by: As directed    Call MD for:  persistant nausea and vomiting   Complete by: As directed    Call MD for:  severe uncontrolled pain   Complete by: As directed    Call MD for:  temperature >100.4   Complete by: As directed    Diet - low sodium heart healthy   Complete by: As directed    Discharge instructions   Complete by: As directed    Recommendations at discharge:   7 more days of oral Levaquin and Flagyl  Follow-up with oncology as an outpatient  General discharge instructions: Follow with Primary MD Angelique Blonder, DO in 7 days  Please request your PCP  to go over your hospital tests, procedures, radiology results at the follow up. Please get your medicines reviewed and adjusted.  Your PCP may decide to repeat certain labs or tests as needed. Do not  drive, operate heavy machinery, perform activities at heights, swimming or participation in water activities or provide baby sitting services if your were admitted for syncope or siezures until you have seen by Primary MD or a Neurologist and advised to do so again. [place] Controlled Substance Reporting System database was reviewed. Do not drive, operate heavy  machinery, perform activities at heights, swim, participate in water activities or provide baby-sitting services while on medications for pain, sleep and mood until your outpatient physician has reevaluated you and advised to do so again.  You are strongly recommended to comply with the dose, frequency and duration of prescribed medications. Activity: As tolerated with Full fall precautions use walker/cane & assistance as needed Avoid using any recreational substances like cigarette, tobacco, alcohol, or non-prescribed drug. If you experience worsening of your admission symptoms, develop shortness of breath, life threatening emergency, suicidal or homicidal thoughts you must seek medical attention immediately by calling 911 or calling your MD immediately  if symptoms less severe. You must read complete instructions/literature along with all the possible adverse reactions/side effects for all the medicines you take and that have been prescribed to you. Take any new medicine only after you have completely understood and accepted all the possible adverse reactions/side effects.  Wear Seat belts while driving. You were cared for by a hospitalist during your hospital stay. If you have any questions about your discharge medications or the care you received while you were in the hospital after you are discharged, you can call the unit and ask to speak with the hospitalist or the covering physician. Once you are discharged, your primary care physician will handle any further medical issues. Please note that NO REFILLS for any discharge medications will  be authorized once you are discharged, as it is imperative that you return to your primary care physician (or establish a relationship with a primary care physician if you do not have one).   Discharge wound care:   Complete by: As directed    Increase activity slowly   Complete by: As directed        Discharge Medications:   Allergies as of 03/18/2022       Reactions   Dilaudid [hydromorphone Hcl] Rash   Ramipril Rash, Other (See Comments)   Per patient on R arm and leg only; no angioedema   Tramadol Itching, Rash        Medication List     STOP taking these medications    lidocaine 5 % Commonly known as: LIDODERM   metoprolol tartrate 25 MG tablet Commonly known as: LOPRESSOR   naloxone 4 MG/0.1ML Liqd nasal spray kit Commonly known as: NARCAN       TAKE these medications    acetaminophen 500 MG tablet Commonly known as: TYLENOL Take 1,000 mg by mouth every 6 (six) hours as needed for mild pain.   amLODipine 10 MG tablet Commonly known as: NORVASC Take 10 mg by mouth daily.   atorvastatin 80 MG tablet Commonly known as: LIPITOR Take 1 tablet (80 mg total) by mouth daily at 6 PM.   chlorthalidone 25 MG tablet Commonly known as: HYGROTON Take 25 mg by mouth daily.   clopidogrel 75 MG tablet Commonly known as: Plavix Take 1 tablet (75 mg total) by mouth daily.   diclofenac Sodium 1 % Gel Commonly known as: VOLTAREN Apply 2-4 g topically in the morning, at noon, in the evening, and at bedtime.   docusate sodium 100 MG capsule Commonly known as: COLACE Take 1 capsule (100 mg total) by mouth daily.   DULoxetine 30 MG capsule Commonly known as: CYMBALTA Take 30 mg by mouth daily as needed (depression).   Eliquis 5 MG Tabs tablet Generic drug: apixaban Take 1 tablet (5 mg total) by mouth 2 (two) times daily.   feeding supplement Liqd Take 237  mLs by mouth 2 (two) times daily between meals.   gabapentin 100 MG capsule Commonly known as:  NEURONTIN Take 100 mg by mouth at bedtime as needed (for pain).   levofloxacin 500 MG tablet Commonly known as: LEVAQUIN Take 1 tablet (500 mg total) by mouth daily for 7 days.   lidocaine-prilocaine cream Commonly known as: EMLA Apply 1 Application topically as needed.   MENS MULTIVITAMIN PO Take 1 tablet by mouth daily.   metroNIDAZOLE 500 MG tablet Commonly known as: FLAGYL Take 1 tablet (500 mg total) by mouth every 12 (twelve) hours for 7 days.   ondansetron 8 MG tablet Commonly known as: ZOFRAN Take 1 tablet (8 mg total) by mouth every 8 (eight) hours as needed for nausea or vomiting.   oxyCODONE-acetaminophen 10-325 MG tablet Commonly known as: Percocet Take 1 tablet by mouth every 6 (six) hours as needed for pain.   prochlorperazine 10 MG tablet Commonly known as: COMPAZINE Take 1 tablet (10 mg total) by mouth every 6 (six) hours as needed for nausea or vomiting.               Discharge Care Instructions  (From admission, onward)           Start     Ordered   03/18/22 0000  Discharge wound care:        03/18/22 1416             The results of significant diagnostics from this hospitalization (including imaging, microbiology, ancillary and laboratory) are listed below for reference.    Procedures and Diagnostic Studies:   CT CHEST ABDOMEN PELVIS WO CONTRAST  Result Date: 03/11/2022 CLINICAL DATA:  Altered mental status. While the reported history is newly diagnosed pancreatic cancer, when correlating with recent PET-CT, the patient has metastatic distal esophageal cancer. EXAM: CT CHEST, ABDOMEN AND PELVIS WITHOUT CONTRAST TECHNIQUE: Multidetector CT imaging of the chest, abdomen and pelvis was performed following the standard protocol without IV contrast. RADIATION DOSE REDUCTION: This exam was performed according to the departmental dose-optimization program which includes automated exposure control, adjustment of the mA and/or kV according to  patient size and/or use of iterative reconstruction technique. COMPARISON:  PET-CT dated 03/08/2022. CTA chest dated 02/27/2022. MRI abdomen dated 02/11/2022. CT abdomen/pelvis dated 02/11/2022. FINDINGS: CT CHEST FINDINGS Cardiovascular: The heart is normal in size. Trace pericardial fluid. No evidence thoracic aortic aneurysm. Atherosclerotic calcifications of the arch. Three vessel coronary atherosclerosis. Right chest power port terminates at the cavoatrial junction. Mediastinum/Nodes: Known distal esophageal mass is not well visualized on unenhanced CT. Small mediastinal nodes, including a 9 mm short axis lower esophageal node (series 13/image 165) and 8 mm short axis low right paratracheal node (series 13/image 36). Visualized thyroid is unremarkable. Lungs/Pleura: Mild centrilobular emphysematous changes. Mild linear scarring/atelectasis in the right lower lobe. No focal consolidation. No suspicious pulmonary nodules, noting motion degradation. No pleural effusion or pneumothorax. Musculoskeletal: Healing anterior 2nd through 7th rib fractures bilaterally. Healing sternal fracture. Thoracic spine is within normal limits. CT ABDOMEN PELVIS FINDINGS Hepatobiliary: Large central hepatic mass measuring 8.3 cm, poorly evaluated on the current study, better evaluated on prior CT. Gallbladder is unremarkable. No intrahepatic or extrahepatic ductal dilatation. Pancreas: Parenchymal calcifications in the head/uncinate process, reflecting sequela chronic pancreatitis. Trace peripancreatic fluid along the pancreatic tail (series 13/image 209), reflecting acute inflammatory change. 4.3 cm fluid density lesion in the pancreatic head (series 13/image 221), non FDG avid on recent PET-CT, likely reflecting a pseudocyst. Spleen: Within normal  limits. Adrenals/Urinary Tract: Adrenal glands are within normal limits. Kidneys are within normal limits.  No hydronephrosis. Bladder is within normal limits. Stomach/Bowel: Stomach is  within normal limits. No evidence of bowel obstruction. Normal appendix (series 13/image 278). Mild sigmoid diverticulosis, without evidence of diverticulitis. Vascular/Lymphatic: No evidence of abdominal aortic aneurysm. Atherosclerotic calcifications of the abdominal aorta and branch vessels. Upper abdominal lymphadenopathy, including a dominant 2.7 cm short axis gastrohepatic node (series 13/image 193). Additional small calcified para-aortic nodes, possibly reactive. Reproductive: Prostate is unremarkable. Other: Mild presacral stranding.  No abdominopelvic ascites. Musculoskeletal: Mild degenerative changes of the lumbar spine. Status post PLIF at L5-S1. IMPRESSION: No interval change from recent PET-CT.  No acute findings. Known distal esophageal mass (corresponding to the patient's primary cancer) is not well visualized on unenhanced CT. Large hepatic metastasis.  Upper abdominal nodal metastases. Acute on chronic pancreatitis with 4.3 cm pseudocyst in the pancreatic head. Additional stable ancillary findings as above. Aortic Atherosclerosis (ICD10-I70.0) and Emphysema (ICD10-J43.9). Electronically Signed   By: Julian Hy M.D.   On: 03/11/2022 21:44   CT Head Wo Contrast  Result Date: 03/11/2022 CLINICAL DATA:  Altered mental status EXAM: CT HEAD WITHOUT CONTRAST TECHNIQUE: Contiguous axial images were obtained from the base of the skull through the vertex without intravenous contrast. RADIATION DOSE REDUCTION: This exam was performed according to the departmental dose-optimization program which includes automated exposure control, adjustment of the mA and/or kV according to patient size and/or use of iterative reconstruction technique. 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 No acute intracranial hemorrhage. No midline shift or other mass effect. Vascular: No abnormal hyperdensity of the major intracranial arteries or dural  venous sinuses. No intracranial atherosclerosis. Skull: The visualized skull base, calvarium and extracranial soft tissues are normal. Sinuses/Orbits: No fluid levels or advanced mucosal thickening of the visualized paranasal sinuses. No mastoid or middle ear effusion. The orbits are normal. IMPRESSION: 1. No acute intracranial abnormality. 2. 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 Electronically Signed   By: Ulyses Jarred M.D.   On: 03/11/2022 21:39   DG Chest Port 1 View  Result Date: 03/11/2022 CLINICAL DATA:  Questionable sepsis. EXAM: PORTABLE CHEST 1 VIEW COMPARISON:  Chest x-ray 02/27/2022 FINDINGS: Right chest port catheter tip projects over the SVC. This is new from prior. The heart is enlarged, unchanged. There is no focal lung infiltrate, pleural effusion or pneumothorax. There are atherosclerotic calcifications of the aorta. No acute fractures are seen. IMPRESSION: 1. New right-sided chest port with distal catheter tip projecting over the SVC. 2. Stable cardiomegaly. 3. The lungs are clear. Electronically Signed   By: Ronney Asters M.D.   On: 03/11/2022 19:59     Labs:   Basic Metabolic Panel: Recent Labs  Lab 03/11/22 1930 03/11/22 2120 03/12/22 0349 03/13/22 0355 03/14/22 0415 03/15/22 0356 03/16/22 1147  NA 136   < > 140 140 139 141 140  K 3.2*   < > 3.8 3.9 3.7 3.6 3.5  CL 105   < > 110 109 106 107 107  CO2 24  --  25 24 24 25 24   GLUCOSE 121*   < > 125* 92 98 89 99  BUN 16   < > 15 12 11 9 8   CREATININE 1.02   < > 0.95 0.92 0.83 0.72 0.81  CALCIUM 8.3*  --  8.4* 8.6* 8.6* 8.5* 9.2  MG 1.7  --  1.8  --   --   --  1.7  PHOS  --   --   --   --   --   --  4.1   < > = values in this interval not displayed.   GFR Estimated Creatinine Clearance: 87.2 mL/min (by C-G formula based on SCr of 0.81 mg/dL). Liver Function Tests: Recent Labs  Lab 03/11/22 1930 03/12/22 0349 03/13/22 0355 03/14/22 0415  AST 36 30 28 23   ALT 29 27 25 22    ALKPHOS 241* 203* 202* 228*  BILITOT 0.6 0.6 0.5 0.5  PROT 7.3 6.6 6.3* 6.4*  ALBUMIN 2.9* 2.5* 2.4* 2.4*   Recent Labs  Lab 03/11/22 1930  LIPASE 98*   No results for input(s): "AMMONIA" in the last 168 hours. Coagulation profile Recent Labs  Lab 03/11/22 2009 03/17/22 0306  INR 1.6* 1.2    CBC: Recent Labs  Lab 03/11/22 1930 03/11/22 2120 03/12/22 0349 03/13/22 0355 03/14/22 0415 03/15/22 0356 03/16/22 1147  WBC 5.3  --  4.6 5.5 5.9 6.7 7.9  NEUTROABS 4.1  --   --  3.5 4.1 4.8  --   HGB 8.5*   < > 8.3* 8.4* 9.0* 9.0* 10.7*  HCT 27.8*   < > 27.3* 27.7* 29.3* 29.6* 35.1*  MCV 86.6  --  86.4 85.2 84.0 84.6 84.6  PLT 168  --  129* 141* 164 205 254   < > = values in this interval not displayed.   Cardiac Enzymes: No results for input(s): "CKTOTAL", "CKMB", "CKMBINDEX", "TROPONINI" in the last 168 hours. BNP: Invalid input(s): "POCBNP" CBG: No results for input(s): "GLUCAP" in the last 168 hours. D-Dimer No results for input(s): "DDIMER" in the last 72 hours. Hgb A1c No results for input(s): "HGBA1C" in the last 72 hours. Lipid Profile No results for input(s): "CHOL", "HDL", "LDLCALC", "TRIG", "CHOLHDL", "LDLDIRECT" in the last 72 hours. Thyroid function studies No results for input(s): "TSH", "T4TOTAL", "T3FREE", "THYROIDAB" in the last 72 hours.  Invalid input(s): "FREET3" Anemia work up No results for input(s): "VITAMINB12", "FOLATE", "FERRITIN", "TIBC", "IRON", "RETICCTPCT" in the last 72 hours. Microbiology Recent Results (from the past 240 hour(s))  Blood Culture (routine x 2)     Status: Abnormal   Collection Time: 03/11/22  7:30 PM   Specimen: BLOOD  Result Value Ref Range Status   Specimen Description   Final    BLOOD RIGHT ANTECUBITAL Performed at [facility] [address] [place], [place] [address]    Special Requests   Final    BOTTLES DRAWN AEROBIC AND ANAEROBIC Blood Culture results may not be optimal due to an  excessive volume of blood received in culture bottles Performed at [facility] [address] [place], [place] [address]    Culture  Setup Time   Final    GRAM NEGATIVE RODS IN BOTH AEROBIC AND ANAEROBIC BOTTLES CRITICAL RESULT CALLED TO, READ BACK BY AND VERIFIED WITH: N. Jackson, AT 1412 03/12/22 D. VANHOOK Performed at [facility], [place] [address], [place], [place] [address]    Culture ESCHERICHIA COLI (A)  Final   Report Status 03/14/2022 FINAL  Final   Organism ID, Bacteria ESCHERICHIA COLI  Final      Susceptibility   Escherichia coli - MIC*    AMPICILLIN >=32 RESISTANT Resistant     CEFAZOLIN <=4 SENSITIVE Sensitive     CEFEPIME <=0.12 SENSITIVE Sensitive     CEFTAZIDIME <=1 SENSITIVE Sensitive     CEFTRIAXONE <=0.25 SENSITIVE Sensitive  CIPROFLOXACIN <=0.25 SENSITIVE Sensitive     GENTAMICIN >=16 RESISTANT Resistant     IMIPENEM <=0.25 SENSITIVE Sensitive     TRIMETH/SULFA >=320 RESISTANT Resistant     AMPICILLIN/SULBACTAM 16 INTERMEDIATE Intermediate     PIP/TAZO <=4 SENSITIVE Sensitive     * ESCHERICHIA COLI  Blood Culture ID Panel (Reflexed)     Status: Abnormal   Collection Time: 03/11/22  7:30 PM  Result Value Ref Range Status   Enterococcus faecalis NOT DETECTED NOT DETECTED Final   Enterococcus Faecium NOT DETECTED NOT DETECTED Final   Listeria monocytogenes NOT DETECTED NOT DETECTED Final   Staphylococcus species NOT DETECTED NOT DETECTED Final   Staphylococcus aureus (BCID) NOT DETECTED NOT DETECTED Final   Staphylococcus epidermidis NOT DETECTED NOT DETECTED Final   Staphylococcus lugdunensis NOT DETECTED NOT DETECTED Final   Streptococcus species NOT DETECTED NOT DETECTED Final   Streptococcus agalactiae NOT DETECTED NOT DETECTED Final   Streptococcus pneumoniae NOT DETECTED NOT DETECTED Final   Streptococcus pyogenes NOT DETECTED NOT DETECTED Final   A.calcoaceticus-baumannii NOT DETECTED NOT DETECTED Final    Bacteroides fragilis NOT DETECTED NOT DETECTED Final   Enterobacterales DETECTED (A) NOT DETECTED Final    Comment: Enterobacterales represent a large order of gram negative bacteria, not a single organism. CRITICAL RESULT CALLED TO, READ BACK BY AND VERIFIED WITH: N. Franklin, AT 1412 03/12/22 D. VANHOOK    Enterobacter cloacae complex NOT DETECTED NOT DETECTED Final   Escherichia coli DETECTED (A) NOT DETECTED Final    Comment: CRITICAL RESULT CALLED TO, READ BACK BY AND VERIFIED WITH: N. Kent, AT 1412 03/12/22 D. VANHOOK    Klebsiella aerogenes NOT DETECTED NOT DETECTED Final   Klebsiella oxytoca NOT DETECTED NOT DETECTED Final   Klebsiella pneumoniae NOT DETECTED NOT DETECTED Final   Proteus species NOT DETECTED NOT DETECTED Final   Salmonella species NOT DETECTED NOT DETECTED Final   Serratia marcescens NOT DETECTED NOT DETECTED Final   Haemophilus influenzae NOT DETECTED NOT DETECTED Final   Neisseria meningitidis NOT DETECTED NOT DETECTED Final   Pseudomonas aeruginosa NOT DETECTED NOT DETECTED Final   Stenotrophomonas maltophilia NOT DETECTED NOT DETECTED Final   Candida albicans NOT DETECTED NOT DETECTED Final   Candida auris NOT DETECTED NOT DETECTED Final   Candida glabrata NOT DETECTED NOT DETECTED Final   Candida krusei NOT DETECTED NOT DETECTED Final   Candida parapsilosis NOT DETECTED NOT DETECTED Final   Candida tropicalis NOT DETECTED NOT DETECTED Final   Cryptococcus neoformans/gattii NOT DETECTED NOT DETECTED Final   CTX-M ESBL NOT DETECTED NOT DETECTED Final   Carbapenem resistance IMP NOT DETECTED NOT DETECTED Final   Carbapenem resistance KPC NOT DETECTED NOT DETECTED Final   Carbapenem resistance NDM NOT DETECTED NOT DETECTED Final   Carbapenem resist OXA 48 LIKE NOT DETECTED NOT DETECTED Final   Carbapenem resistance VIM NOT DETECTED NOT DETECTED Final    Comment: Performed at [facility], [address], [place], [place] [address]   Blood Culture (routine x 2)     Status: Abnormal   Collection Time: 03/11/22  7:32 PM   Specimen: BLOOD  Result Value Ref Range Status   Specimen Description   Final    BLOOD BLOOD LEFT FOREARM Performed at [facility] [address] [place], [place] [address]    Special Requests   Final    BOTTLES DRAWN AEROBIC AND ANAEROBIC Blood Culture results may not be optimal due to an excessive volume of blood  received in culture bottles Performed at [facility], [place] [address], [place], [place] [address]    Culture  Setup Time   Final    GRAM NEGATIVE RODS IN BOTH AEROBIC AND ANAEROBIC BOTTLES CRITICAL VALUE NOTED.  VALUE IS CONSISTENT WITH PREVIOUSLY REPORTED AND CALLED VALUE. Performed at [facility], [place] [address], [place], [place] [address]    Culture ESCHERICHIA COLI (A)  Final   Report Status 03/14/2022 FINAL  Final   Organism ID, Bacteria ESCHERICHIA COLI  Final      Susceptibility   Escherichia coli - MIC*    AMPICILLIN >=32 RESISTANT Resistant     CEFAZOLIN <=4 SENSITIVE Sensitive     CEFEPIME <=0.12 SENSITIVE Sensitive     CEFTAZIDIME <=1 SENSITIVE Sensitive     CEFTRIAXONE <=0.25 SENSITIVE Sensitive     CIPROFLOXACIN <=0.25 SENSITIVE Sensitive     GENTAMICIN >=16 RESISTANT Resistant     IMIPENEM <=0.25 SENSITIVE Sensitive     TRIMETH/SULFA >=320 RESISTANT Resistant     AMPICILLIN/SULBACTAM 16 INTERMEDIATE Intermediate     PIP/TAZO <=4 SENSITIVE Sensitive     * ESCHERICHIA COLI  Urine Culture     Status: None   Collection Time: 03/12/22  1:30 AM   Specimen: In/Out Cath Urine  Result Value Ref Range Status   Specimen Description   Final    IN/OUT CATH URINE Performed at [facility] [address] [place], [place] [address]    Special Requests   Final    NONE Performed at [facility], [place] [address], [place], [place] [address]    Culture   Final    NO GROWTH Performed at  [facility], [place] [address], [place], [place] [address]    Report Status 03/13/2022 FINAL  Final  Resp Panel by RT-PCR (Flu A&B, Covid) Anterior Nasal Swab     Status: None   Collection Time: 03/12/22  1:32 AM   Specimen: Anterior Nasal Swab  Result Value Ref Range Status   SARS Coronavirus 2 by RT PCR NEGATIVE NEGATIVE Final    Comment: (NOTE) SARS-CoV-2 target nucleic acids are NOT DETECTED.  The SARS-CoV-2 RNA is generally detectable in upper respiratory specimens during the acute phase of infection. The lowest concentration of SARS-CoV-2 viral copies this assay can detect is 138 copies/mL. A negative result does not preclude SARS-Cov-2 infection and should not be used as the sole basis for treatment or other patient management decisions. A negative result may occur with  improper specimen collection/handling, submission of specimen other than nasopharyngeal swab, presence of viral mutation(s) within the areas targeted by this assay, and inadequate number of viral copies(<138 copies/mL). A negative result must be combined with clinical observations, patient history, and epidemiological information. The expected result is Negative.  Fact Sheet for Patients:  EntrepreneurPulse.com.au  Fact Sheet for Healthcare Providers:  IncredibleEmployment.be  This test is no t yet approved or cleared by the [place] FDA and  has been authorized for detection and/or diagnosis of SARS-CoV-2 by FDA under an Emergency Use Authorization (EUA). This EUA will remain  in effect (meaning this test can be used) for the duration of the COVID-19 declaration under Section 564(b)(1) of the Act, 21 U.S.C.section 360bbb-3(b)(1), unless the authorization is terminated  or revoked sooner.       Influenza A by PCR NEGATIVE NEGATIVE Final   Influenza B by PCR NEGATIVE NEGATIVE Final    Comment: (NOTE) The Xpert Xpress SARS-CoV-2/FLU/RSV plus  assay is intended  as an aid in the diagnosis of influenza from Nasopharyngeal swab specimens and should not be used as a sole basis for treatment. Nasal washings and aspirates are unacceptable for Xpert Xpress SARS-CoV-2/FLU/RSV testing.  Fact Sheet for Patients: EntrepreneurPulse.com.au  Fact Sheet for Healthcare Providers: IncredibleEmployment.be  This test is not yet approved or cleared by the [place] FDA and has been authorized for detection and/or diagnosis of SARS-CoV-2 by FDA under an Emergency Use Authorization (EUA). This EUA will remain in effect (meaning this test can be used) for the duration of the COVID-19 declaration under Section 564(b)(1) of the Act, 21 U.S.C. section 360bbb-3(b)(1), unless the authorization is terminated or revoked.  Performed at [facility], [place] [address] [place], [place] [address]   Aerobic/Anaerobic Culture w Gram Stain (surgical/deep wound)     Status: None (Preliminary result)   Collection Time: 03/17/22  1:28 PM   Specimen: Liver; Tissue  Result Value Ref Range Status   Specimen Description   Final    LIVER Performed at [facility] [address] [place], [place] [address]    Special Requests TUMOR BIOPSY  Final   Gram Stain NO WBC SEEN NO ORGANISMS SEEN   Final   Culture   Final    NO GROWTH < 24 HOURS Performed at [facility], [place] [address] [place], [place] [address]    Report Status PENDING  Incomplete    Time coordinating discharge: 35 minutes  Signed: Rosa Gambale  [facility] 03/18/2022, 2:16 PM

## 2022-03-20 ENCOUNTER — Telehealth: Payer: Self-pay

## 2022-03-20 ENCOUNTER — Encounter: Payer: Self-pay | Admitting: *Deleted

## 2022-03-20 LAB — SURGICAL PATHOLOGY

## 2022-03-20 NOTE — Patient Outreach (Signed)
  Care Coordination TOC Note Transition Care Management Unsuccessful Follow-up Telephone Call  Date of discharge and from where:  [facility] 03/11/22-03/18/22  Attempts:  2nd Attempt  Reason for unsuccessful TCM follow-up call:  Unable to leave message  [name], RN, BSN, CCM Care Management Coordinator [facility] Phone: [phone]: [phone]

## 2022-03-20 NOTE — Patient Outreach (Signed)
  Care Coordination TOC Note Transition Care Management Unsuccessful Follow-up Telephone Call  Date of discharge and from where:  [facility] 03/11/22-03/18/22  Attempts:  1st Attempt  Reason for unsuccessful TCM follow-up call:  Unable to leave message- Unidentified voicemail.  [name], RN, BSN, CCM Care Management Coordinator [facility] Phone: [phone]: [phone]

## 2022-03-20 NOTE — Progress Notes (Signed)
PATIENT NAVIGATOR PROGRESS NOTE  Name: [name] [name] Date: 03/20/2022 MRN: [id number]  DOB: 1960/10/01   Reason for visit:  F/U hospitalization phone call  Comments:  Called and clarified upcoming appts  Ordered PDL1 testing from [id number]     Time spent counseling/coordinating care: 45-60 minutes

## 2022-03-21 ENCOUNTER — Other Ambulatory Visit: Payer: Medicare Other

## 2022-03-21 ENCOUNTER — Telehealth: Payer: Self-pay

## 2022-03-21 NOTE — Patient Outreach (Signed)
  Care Coordination TOC Note Transition Care Management Unsuccessful Follow-up Telephone Call  Date of discharge and from where:  [facility] 03/11/22-03/18/22  Attempts:  3rd Attempt  Reason for unsuccessful TCM follow-up call:  Unable to leave message

## 2022-03-22 ENCOUNTER — Telehealth: Payer: Self-pay

## 2022-03-22 ENCOUNTER — Other Ambulatory Visit: Payer: Self-pay | Admitting: Oncology

## 2022-03-22 DIAGNOSIS — C159 Malignant neoplasm of esophagus, unspecified: Secondary | ICD-10-CM | POA: Insufficient documentation

## 2022-03-22 DIAGNOSIS — C155 Malignant neoplasm of lower third of esophagus: Secondary | ICD-10-CM

## 2022-03-22 LAB — AEROBIC/ANAEROBIC CULTURE W GRAM STAIN (SURGICAL/DEEP WOUND)
Culture: NO GROWTH
Gram Stain: NONE SEEN

## 2022-03-22 NOTE — Telephone Encounter (Signed)
CSW attempted to contact patient per the request of Nurse Navigator to assess psychosocial needs.  His vm was full and no message could be left.

## 2022-03-22 NOTE — Progress Notes (Signed)
DISCONTINUE OFF PATHWAY REGIMEN - Other   [id number] (Leucovorin IV D1 + Fluorouracil IV D1/CIV D1,2 + Oxaliplatin IV D1) q14 Days:   A cycle is every 14 days:     Oxaliplatin      Leucovorin      Fluorouracil      Fluorouracil   **Always confirm dose/schedule in your pharmacy ordering system**  REASON: Other Reason PRIOR TREATMENT: [id number] (Leucovorin IV D1 + Fluorouracil IV D1/CIV D1,2 + Oxaliplatin IV D1) q14 Days TREATMENT RESPONSE: Unable to Evaluate  START OFF PATHWAY REGIMEN - Other   [id number]:mFOLFOX6 + Trastuzumab 6/4 mg/kg IV q14 Days x 8 Cycles Followed by Trastuzumab 6 mg/kg IV q21 Days:   Cycle 1: A cycle is every 14 days:     Trastuzumab-xxxx      Oxaliplatin      Leucovorin      Fluorouracil      Fluorouracil    Cycles 2 through 8: A cycle is every 14 days:     Trastuzumab-xxxx      Oxaliplatin      Leucovorin      Fluorouracil      Fluorouracil    Cycles 9 and beyond: A cycle is every 21 days:     Trastuzumab-xxxx   **Always confirm dose/schedule in your pharmacy ordering system**  Patient Characteristics: Intent of Therapy: Non-Curative / Palliative Intent, Discussed with Patient

## 2022-03-23 ENCOUNTER — Inpatient Hospital Stay (HOSPITAL_BASED_OUTPATIENT_CLINIC_OR_DEPARTMENT_OTHER): Payer: Medicare Other | Admitting: Oncology

## 2022-03-23 ENCOUNTER — Inpatient Hospital Stay: Payer: Medicare Other

## 2022-03-23 ENCOUNTER — Other Ambulatory Visit: Payer: Self-pay | Admitting: *Deleted

## 2022-03-23 VITALS — BP 112/88 | HR 100 | Temp 98.2°F | Resp 18 | Ht 67.0 in | Wt 145.4 lb

## 2022-03-23 DIAGNOSIS — C155 Malignant neoplasm of lower third of esophagus: Secondary | ICD-10-CM | POA: Diagnosis not present

## 2022-03-23 DIAGNOSIS — D509 Iron deficiency anemia, unspecified: Secondary | ICD-10-CM | POA: Diagnosis not present

## 2022-03-23 DIAGNOSIS — C787 Secondary malignant neoplasm of liver and intrahepatic bile duct: Secondary | ICD-10-CM | POA: Diagnosis not present

## 2022-03-23 DIAGNOSIS — Z86718 Personal history of other venous thrombosis and embolism: Secondary | ICD-10-CM | POA: Diagnosis not present

## 2022-03-23 DIAGNOSIS — C801 Malignant (primary) neoplasm, unspecified: Secondary | ICD-10-CM | POA: Diagnosis not present

## 2022-03-23 DIAGNOSIS — Z95828 Presence of other vascular implants and grafts: Secondary | ICD-10-CM | POA: Diagnosis not present

## 2022-03-23 DIAGNOSIS — C159 Malignant neoplasm of esophagus, unspecified: Secondary | ICD-10-CM | POA: Diagnosis not present

## 2022-03-23 DIAGNOSIS — Z86711 Personal history of pulmonary embolism: Secondary | ICD-10-CM | POA: Diagnosis not present

## 2022-03-23 LAB — CBC WITH DIFFERENTIAL (CANCER CENTER ONLY)
Abs Immature Granulocytes: 0.01 10*3/uL (ref 0.00–0.07)
Basophils Absolute: 0.1 10*3/uL (ref 0.0–0.1)
Basophils Relative: 1 %
Eosinophils Absolute: 0.2 10*3/uL (ref 0.0–0.5)
Eosinophils Relative: 2 %
HCT: 36.4 % — ABNORMAL LOW (ref 39.0–52.0)
Hemoglobin: 11.2 g/dL — ABNORMAL LOW (ref 13.0–17.0)
Immature Granulocytes: 0 %
Lymphocytes Relative: 12 %
Lymphs Abs: 1.1 10*3/uL (ref 0.7–4.0)
MCH: 25.6 pg — ABNORMAL LOW (ref 26.0–34.0)
MCHC: 30.8 g/dL (ref 30.0–36.0)
MCV: 83.3 fL (ref 80.0–100.0)
Monocytes Absolute: 0.5 10*3/uL (ref 0.1–1.0)
Monocytes Relative: 5 %
Neutro Abs: 7.1 10*3/uL (ref 1.7–7.7)
Neutrophils Relative %: 80 %
Platelet Count: 185 10*3/uL (ref 150–400)
RBC: 4.37 MIL/uL (ref 4.22–5.81)
RDW: 17.7 % — ABNORMAL HIGH (ref 11.5–15.5)
WBC Count: 8.9 10*3/uL (ref 4.0–10.5)
nRBC: 0 % (ref 0.0–0.2)

## 2022-03-23 LAB — CMP (CANCER CENTER ONLY)
ALT: 14 U/L (ref 0–44)
AST: 31 U/L (ref 15–41)
Albumin: 3.7 g/dL (ref 3.5–5.0)
Alkaline Phosphatase: 328 U/L — ABNORMAL HIGH (ref 38–126)
Anion gap: 7 (ref 5–15)
BUN: 17 mg/dL (ref 8–23)
CO2: 27 mmol/L (ref 22–32)
Calcium: 9.3 mg/dL (ref 8.9–10.3)
Chloride: 105 mmol/L (ref 98–111)
Creatinine: 0.85 mg/dL (ref 0.61–1.24)
GFR, Estimated: >60 mL/min
Glucose, Bld: 165 mg/dL — ABNORMAL HIGH (ref 70–99)
Potassium: 3.7 mmol/L (ref 3.5–5.1)
Sodium: 139 mmol/L (ref 135–145)
Total Bilirubin: 0.3 mg/dL (ref 0.3–1.2)
Total Protein: 7.3 g/dL (ref 6.5–8.1)

## 2022-03-23 MED ORDER — SODIUM CHLORIDE 0.9% FLUSH
10.0000 mL | Freq: Once | INTRAVENOUS | Status: AC
  Administered 2022-03-23: 10 mL via INTRAVENOUS

## 2022-03-23 MED ORDER — HEPARIN SOD (PORK) LOCK FLUSH 100 UNIT/ML IV SOLN
500.0000 [IU] | Freq: Once | INTRAVENOUS | Status: AC
  Administered 2022-03-23: 500 [IU] via INTRAVENOUS

## 2022-03-23 NOTE — Patient Instructions (Signed)
STOP taking your multivitamin while under chemotherapy treatment.

## 2022-03-23 NOTE — Progress Notes (Signed)
[facility] OFFICE PROGRESS NOTE   Diagnosis: Esophagus cancer  INTERVAL HISTORY:   [name] [name] was discharged from the hospital 03/18/2022.  He was discharged to complete an outpatient course of Levaquin and Flagyl. He underwent ultrasound-guided biopsy of the dominant necrotic appearing liver lesion on 03/17/2022.  Only a scant amount of fluid could be aspirated from the lesion.  The lesion was biopsied.  Pathology revealed necrotic tissue.  A Gram stain revealed no white cells or organisms.  A culture was negative.  He returns to discuss systemic therapy.  No fever or chills.  No complaint.  Objective:  Vital signs in last 24 hours:  Blood pressure 112/88, pulse 100, temperature 98.2 F (36.8 C), temperature source Oral, resp. rate 18, height _0  (1.702 m), weight 145 lb 6.4 oz (66 kg), SpO2 97 %.    HEENT: No thrush Resp: Lungs clear bilaterally Cardio: Regular rate and rhythm GI: No hepatosplenomegaly, mild tenderness at the right costal margin, no mass Vascular: No right leg edema   Portacath/PICC-without erythema  Lab Results:  Lab Results  Component Value Date   WBC 7.9 03/16/2022   HGB 10.7 (L) 03/16/2022   HCT 35.1 (L) 03/16/2022   MCV 84.6 03/16/2022   PLT 254 03/16/2022   NEUTROABS 4.8 03/15/2022    CMP  Lab Results  Component Value Date   NA 140 03/16/2022   K 3.5 03/16/2022   CL 107 03/16/2022   CO2 24 03/16/2022   GLUCOSE 99 03/16/2022   BUN 8 03/16/2022   CREATININE 0.81 03/16/2022   CALCIUM 9.2 03/16/2022   PROT 6.4 (L) 03/14/2022   ALBUMIN 2.4 (L) 03/14/2022   AST 23 03/14/2022   ALT 22 03/14/2022   ALKPHOS 228 (H) 03/14/2022   BILITOT 0.5 03/14/2022   GFRNONAA >60 03/16/2022   GFRAA >60 01/24/2020    Lab Results  Component Value Date   CEA1 287.0 (H) 02/11/2022   CEA 644.67 (H) 03/03/2022   TIR443 88 (H) 02/11/2022     Medications: I have reviewed the patient's current  medications.   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 -02/11/2022 CTA chest-7.8 x 8.5 cm subtle hypoattenuating mass lesion in segment IV of the liver. -02/11/2022 CT abdomen/pelvis-8.7 x 7.8 or ill-defined irregular mass in the medial segment left liver, 4.5 x 3.4 cm necrotic mass lesion just cranial to the esophagogastric junction with metastatic lymphadenopathy in the abdomen and pelvis, irregular nodular peritoneal thickening in the lower abdomen and pelvis -02/11/2022 MRI abdomen-rim-enhancing likely necrotic mass or lymph node conglomerate centered around the gastroesophageal junction measuring 5.0 x 3.9 cm, large rim-enhancing likely necrotic liver mass centered in the anterior left lobe of the liver measuring 7.8 x 7.2 cm, enlarged gastrohepatic ligament and retroperitoneal lymph nodes -Labs from 02/11/2022-AFP 2.4, CA 19.9 88, CEA 287 -Ultrasound-guided liver biopsy performed 02/15/2022-adenocarcinoma with extensive necrosis, CK7 positive, negative for CK20, CDX2, TTF-1, and PAX8, consistent with a pancreatic, lung, or upper gastrointestinal primary -PET 03/08/2022-hypermetabolic distal esophagus mass, hypermetabolic centrally necrotic hepatic mass, hypermetabolic periportal nodes, exophytic GE junction mass without hypermetabolism-likely benign lesion, no hypermetabolism in the pancreas head, bilateral hypermetabolic adrenal gland lesions, small hypermetabolic left parotid gland lesion.  No loss of mismatch repair protein expression, HER2 3+ -CTs 03/11/2022-no change from PET 03/08/2022, known distal esophagus mass-not visualized on unenhanced CT, large hepatic metastasis and upper abdominal nodal metastases, chronic pancreatitis with 4.3  cm pseudocyst in the pancreas head 2.  Myocardial infarction/cardiac arrest 01/31/2022 3.  Microcytic  anemia -Labs from 02/11/2022-ferritin 151, iron 13, percent saturation 5% 4.  DVT/pulmonary embolism-bilateral lower extremity DVTs 01/10/2022, pulmonary emboli on chest CT 01/31/2022-apixaban 5.  PAD status post AKA in 2021 6.  RA 7.  Bilateral pleural effusions -Ultrasound-guided thoracentesis performed on 02/11/2022.  Cytology negative for malignancy. 8.  COPD 9.  Admission 03/11/2022 with E. coli bacteremia Ultrasound-guided biopsy/aspiration of dominant necrotic appearing liver lesion 03/17/2022-scant fluid aspirated-negative culture, no organisms or white cells.  Pathology with necrotic tissue.     Disposition: Mr. Turnbough has been diagnosed with metastatic adenocarcinoma.  The clinical presentation is consistent with esophagus cancer.  He is not a candidate for an upper endoscopy due to a recent myocardial infarction.  The tumor is HER2 3+.  Molecular testing and a PD-1 combined positive score are pending.  I recommend treatment with FOLFOX/trastuzumab/pembrolizumab.  We again reviewed potential toxicities associated with this regimen.  We specifically discussed the potential of an allergic reaction and cardiac toxicity with trastuzumab.  He had a recent myocardial infarction with a preserved left ventricular ejection fraction.  We will refer him to cardiology for repeat echocardiogram after 2 months of systemic therapy.  We discussed potential toxicities associated with pembrolizumab including the chance of a rash, diarrhea, hypothyroidism, and various other autoimmune toxicities.  He understands of the chance of a flare of rheumatoid arthritis with pembrolizumab.  He agrees to proceed.  Mr. Bias will begin systemic therapy on 03/29/2022.  He will return for an office visit prior to cycle 2 on 08/14/2021.  A chemotherapy plan was entered.  Betsy Coder, MD  03/23/2022  1:40 PM

## 2022-03-23 NOTE — Patient Instructions (Signed)

## 2022-03-25 ENCOUNTER — Other Ambulatory Visit: Payer: Self-pay

## 2022-03-28 ENCOUNTER — Other Ambulatory Visit: Payer: Self-pay

## 2022-03-28 DIAGNOSIS — C801 Malignant (primary) neoplasm, unspecified: Secondary | ICD-10-CM | POA: Diagnosis not present

## 2022-03-29 ENCOUNTER — Inpatient Hospital Stay: Payer: Medicare Other

## 2022-03-29 ENCOUNTER — Other Ambulatory Visit: Payer: Medicare Other

## 2022-03-29 ENCOUNTER — Other Ambulatory Visit: Payer: Self-pay

## 2022-03-29 NOTE — Telephone Encounter (Signed)
Last rx written 03/02/22. Last OV 03/02/22. Next OV 04/03/22.

## 2022-03-29 NOTE — Telephone Encounter (Signed)
oxyCODONE-acetaminophen (PERCOCET) 10-325 MG tablet, refill request @ [id number] - [place], [address] AT [address].

## 2022-03-30 ENCOUNTER — Encounter (HOSPITAL_COMMUNITY): Payer: Self-pay

## 2022-03-30 ENCOUNTER — Other Ambulatory Visit: Payer: Self-pay

## 2022-03-30 MED ORDER — OXYCODONE-ACETAMINOPHEN 10-325 MG PO TABS: 1.0000 | ORAL_TABLET | Freq: Four times a day (QID) | ORAL | 0 refills | Status: DC | PRN

## 2022-03-31 ENCOUNTER — Inpatient Hospital Stay: Payer: Medicare Other

## 2022-04-03 ENCOUNTER — Encounter: Payer: Self-pay | Admitting: Student

## 2022-04-03 ENCOUNTER — Telehealth: Payer: Self-pay | Admitting: *Deleted

## 2022-04-03 ENCOUNTER — Ambulatory Visit: Payer: Medicare Other

## 2022-04-03 NOTE — Progress Notes (Deleted)
CC: hospital f/u visit  HPI:  [name] is a 61 y.o. male with past medical history of RA, HTN, HLD, CAD, PAD, MI (01/2022), PE, stroke, and adenocarcinoma with metastasis to the liver (likely from esophagus) that presents for a hospital f/u visit.   Allergies as of 04/03/2022       Reactions   Dilaudid [hydromorphone Hcl] Rash   Ramipril Rash, Other (See Comments)   Per patient on R arm and leg only; no angioedema   Tramadol Itching, Rash        Medication List        Accurate as of April 03, 2022  7:15 AM. If you have any questions, ask your nurse or doctor.          acetaminophen 500 MG tablet Commonly known as: TYLENOL Take 1,000 mg by mouth every 6 (six) hours as needed for mild pain.   amLODipine 10 MG tablet Commonly known as: NORVASC Take 10 mg by mouth daily.   atorvastatin 80 MG tablet Commonly known as: LIPITOR Take 1 tablet (80 mg total) by mouth daily at 6 PM.   chlorthalidone 25 MG tablet Commonly known as: HYGROTON Take 25 mg by mouth daily.   clopidogrel 75 MG tablet Commonly known as: Plavix Take 1 tablet (75 mg total) by mouth daily.   diclofenac Sodium 1 % Gel Commonly known as: VOLTAREN Apply 2-4 g topically in the morning, at noon, in the evening, and at bedtime.   docusate sodium 100 MG capsule Commonly known as: COLACE Take 1 capsule (100 mg total) by mouth daily.   DULoxetine 30 MG capsule Commonly known as: CYMBALTA Take 30 mg by mouth daily as needed (depression).   Eliquis 5 MG Tabs tablet Generic drug: apixaban Take 1 tablet (5 mg total) by mouth 2 (two) times daily.   feeding supplement Liqd Take 237 mLs by mouth 2 (two) times daily between meals.   gabapentin 100 MG capsule Commonly known as: NEURONTIN Take 100 mg by mouth at bedtime as needed (for pain).   lidocaine-prilocaine cream Commonly known as: EMLA Apply 1 Application topically as needed.   ondansetron 8 MG tablet Commonly known as:  ZOFRAN Take 1 tablet (8 mg total) by mouth every 8 (eight) hours as needed for nausea or vomiting.   oxyCODONE-acetaminophen 10-325 MG tablet Commonly known as: Percocet Take 1 tablet by mouth every 6 (six) hours as needed for pain.   prochlorperazine 10 MG tablet Commonly known as: COMPAZINE Take 1 tablet (10 mg total) by mouth every 6 (six) hours as needed for nausea or vomiting.         Past Medical History:  Diagnosis Date   Abnormal CT scan, gastrointestinal tract    Acute respiratory failure ([facility]) 01/31/2022   Acute respiratory failure with hypoxemia ([facility]) 02/11/2022   Anemia    Anemia 08/06/2012   Aortic valve mass 12/30/2015   Bilateral shoulder pain 08/20/2012   Bite wound of left hand 09/27/2021   Cardiogenic shock ([facility]) 01/31/2022   Community acquired pneumonia 11/07/2011   Critical limb ischemia with history of revascularization of same extremity ([facility]) 04/28/2020   DDD (degenerative disc disease), lumbosacral     and grade 2 slip   DVT (deep venous thrombosis) ([facility]) 01/31/2022   GERD (gastroesophageal reflux disease)    H/O: stroke 01/31/2022   Heart murmur    History of anemia    no current med.   History of COVID-19 05/10/2019   History of  critical lower limb ischemia 02/19/2019   Hyperglycemia 01/31/2022   Hypertension    states is borderline on medication; has been on med. x 5-6 yr.   Hypokalemia 05/30/2018   Impingement syndrome of shoulder region 10/2014   left   Insomnia 06/01/2015   Ischemic ulcer of toe of left foot (HCC)    Lactic acidosis 01/31/2022   Left shoulder pain 12/16/2015   Liver mass    Low back pain without sciatica 10/29/2009   Lupus ([facility])    Multiple fractures of ribs, bilateral, due to CPR trauma 01/31/2022   Onychomycosis 01/28/2021   Paronychia of great toe 01/28/2021   Peripheral vascular disease ([place])    Pneumonia    Pressure injury of skin 01/31/2022   Pulmonary contusion 01/31/2022   RA (rheumatoid arthritis) ([facility])    Rotator cuff tear 11/04/2014    STEMI (ST elevation myocardial infarction) ([facility]) 01/31/2022   Sternal fracture 01/31/2022   Transaminitis 12/16/2015   Review of Systems:  per HPI.   Physical Exam: *** There were no vitals filed for this visit.  *** Constitutional: Well-developed, well-nourished, appears comfortable  HENT: Normocephalic and atraumatic.  Eyes: EOM are normal. PERRL.  Neck: Normal range of motion.  Cardiovascular: Regular rate, regular rhythm. No murmurs, rubs, or gallops. Normal radial and PT pulses bilaterally. No LE edema.  Pulmonary: Normal respiratory effort. No wheezes, rales, or rhonchi.   Abdominal: Soft. Non-distended. No tenderness. Normal bowel sounds.  Musculoskeletal: Normal range of motion.     Neurological: Alert and oriented to person, place, and time. Non-focal. Skin: warm and dry.    Assessment & Plan:   ?: ***  Sepsis POA w/ E. coli bacteremia - Present during recent hospitalization and thought to secondary to necrotic liver mass. Patient discharged on 03/18/2022 w/ 7-day course of levofloxacin 500 mg and 14-day course of Flagyl 5 mg BID. CBC on 9/28 demonstrates no leukocytosis. Patient denies fever, chills, dizziness, lightheadedness.   Plan: -   2. Metastatic Adenocarcinoma CT abdomen during hospitalization in 02/2022 demonstrated known large hepatic metastases and upper abdominal nodal metastases.  - Current medications include  Patient follows up with oncologist Dr. Benay Spice who recommended treatment with FOLFOX/trastuzumab/pembrolizumab. Patient began systemic therapy on 03/29/2022.   Plan: -  3. Prior pulmonary embolism/DVT (recurrence on 02/27/2022) - Recurrent during hospitalization in 02/2022. Patient was previously on apixaban 5 mg BID but compliance was uncertain.   Plan: -  4. Health Screening: - Influenza vaccine (),  - Medication refill?  Plan: -   See Encounters Tab for problem based charting.  Patient seen with Dr.  Barbaraann Boys

## 2022-04-04 ENCOUNTER — Inpatient Hospital Stay: Payer: Medicare Other | Attending: Oncology | Admitting: Licensed Clinical Social Worker

## 2022-04-04 DIAGNOSIS — Z79899 Other long term (current) drug therapy: Secondary | ICD-10-CM | POA: Insufficient documentation

## 2022-04-04 DIAGNOSIS — C801 Malignant (primary) neoplasm, unspecified: Secondary | ICD-10-CM | POA: Insufficient documentation

## 2022-04-04 DIAGNOSIS — C787 Secondary malignant neoplasm of liver and intrahepatic bile duct: Secondary | ICD-10-CM | POA: Insufficient documentation

## 2022-04-04 DIAGNOSIS — Z5111 Encounter for antineoplastic chemotherapy: Secondary | ICD-10-CM | POA: Insufficient documentation

## 2022-04-04 NOTE — Progress Notes (Signed)
[facility]  Initial Assessment   [name] [name] is a 61 y.o. year old male contacted caregiver by phone. Clinical Social Work was referred by nurse navigator for assessment of psychosocial needs.   SDOH (Social Determinants of Health) assessments performed: Yes SDOH Interventions    Flowsheet Row Clinical Support from 04/04/2022 in [facility] Telephone from 03/01/2022 in [place] Telephone from 02/20/2022 in [facility] Interventions     Housing Interventions -- Intervention Not Indicated Intervention Not Indicated  Transportation Interventions -- Intervention Not Indicated Intervention Not Indicated  Utilities Interventions Intervention Not Indicated -- --  Financial Strain Interventions Intervention Not Indicated -- --       SDOH Screenings   Food Insecurity: No Food Insecurity (01/27/2022)  Housing: Low Risk  (03/01/2022)  Transportation Needs: No Transportation Needs (03/01/2022)  Utilities: Not At Risk (04/04/2022)  Depression (PHQ2-9): Medium Risk (03/02/2022)  Financial Resource Strain: Low Risk  (04/04/2022)  Tobacco Use: Medium Risk (03/07/2022)     Distress Screen completed: No    02/22/2022    3:02 PM  ON[facility]N DISTRESS SCREENING  Screening Type Initial Screening  Distress experienced in past week (1-10) 8  Emotional problem type Nervousness/Anxiety  Physical Problem type Pain;Sleep/insomnia;Getting around;Bathing/dressing;Breathing;Skin dry/itchy  Physician notified of physical symptoms Yes  Other phone: [phone]      Family/Social Information:  Housing Arrangement: patient lives with roommates who are not related to patient.  His friend and primary contact, Timmothy Sours, provided the information.   His daughter, her husband and their 29 y/o son live with patient. Family members/support persons in your life? Friends Transportation concerns: no   Employment: Disabled  Income source: Banker concerns: No Type of concern: None Food access concerns: no Religious or spiritual practice: No Services Currently in place:  Medicare and Medicaid  Coping/ Adjustment to diagnosis: Patient understands treatment plan and what happens next? yes Concerns about diagnosis and/or treatment: Quality of life.  Timmothy Sours stated patient remains in bed most of the time and fears it will increase as patient receives chemotherapy. Patient reported stressors:  None per caregiver. Hopes and/or priorities: Timmothy Sours hopes patient can obtain a new w/c. 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  Clinical Social Work Clinical Goal(s):  Explore community resource options for unmet needs related to:  None  Interventions: Discussed common feeling and emotions when being diagnosed with cancer, and the importance of support during treatment Informed patient of the support team roles and support services at [facility] Provided CSW contact information and encouraged patient to call with any questions or concerns Provided education regarding CSW role and the possibility of patient obtaining a new w/c.   Follow Up Plan: CSW will follow-up with patient by phone  Patient verbalizes understanding of plan: Yes    Margaree Mackintosh, LCSW   Patient is participating in a Managed Medicaid Plan:  Yes

## 2022-04-05 ENCOUNTER — Other Ambulatory Visit: Payer: Self-pay

## 2022-04-05 ENCOUNTER — Other Ambulatory Visit: Payer: Self-pay | Admitting: Nurse Practitioner

## 2022-04-05 MED ORDER — ONDANSETRON HCL 8 MG PO TABS: 8.0000 mg | ORAL_TABLET | Freq: Three times a day (TID) | ORAL | 0 refills | Status: DC | PRN

## 2022-04-05 NOTE — Telephone Encounter (Signed)
ondansetron (ZOFRAN) 8 MG tablet , REFILL REQUEST @ [id number] - [place], [place].

## 2022-04-06 ENCOUNTER — Other Ambulatory Visit: Payer: Self-pay | Admitting: Oncology

## 2022-04-06 ENCOUNTER — Encounter: Payer: Self-pay | Admitting: *Deleted

## 2022-04-06 ENCOUNTER — Inpatient Hospital Stay: Payer: Medicare Other

## 2022-04-06 VITALS — BP 116/77 | HR 88 | Temp 98.4°F | Resp 18 | Ht 67.0 in | Wt 136.6 lb

## 2022-04-06 DIAGNOSIS — C801 Malignant (primary) neoplasm, unspecified: Secondary | ICD-10-CM

## 2022-04-06 DIAGNOSIS — Z79899 Other long term (current) drug therapy: Secondary | ICD-10-CM | POA: Diagnosis not present

## 2022-04-06 DIAGNOSIS — Z5111 Encounter for antineoplastic chemotherapy: Secondary | ICD-10-CM | POA: Diagnosis not present

## 2022-04-06 DIAGNOSIS — C155 Malignant neoplasm of lower third of esophagus: Secondary | ICD-10-CM

## 2022-04-06 DIAGNOSIS — C787 Secondary malignant neoplasm of liver and intrahepatic bile duct: Secondary | ICD-10-CM | POA: Diagnosis not present

## 2022-04-06 MED ORDER — PALONOSETRON HCL INJECTION 0.25 MG/5ML
0.2500 mg | Freq: Once | INTRAVENOUS | Status: AC
  Administered 2022-04-06: 0.25 mg via INTRAVENOUS
  Filled 2022-04-06: qty 5

## 2022-04-06 MED ORDER — SODIUM CHLORIDE 0.9 % IV SOLN
2400.0000 mg/m2 | INTRAVENOUS | Status: DC
  Administered 2022-04-06: 4200 mg via INTRAVENOUS
  Filled 2022-04-06: qty 84

## 2022-04-06 MED ORDER — SODIUM CHLORIDE 0.9 % IV SOLN
10.0000 mg | Freq: Once | INTRAVENOUS | Status: AC
  Administered 2022-04-06: 10 mg via INTRAVENOUS
  Filled 2022-04-06: qty 10

## 2022-04-06 MED ORDER — FLUOROURACIL CHEMO INJECTION 2.5 GM/50ML
400.0000 mg/m2 | Freq: Once | INTRAVENOUS | Status: AC
  Administered 2022-04-06: 700 mg via INTRAVENOUS
  Filled 2022-04-06: qty 14

## 2022-04-06 MED ORDER — DIPHENHYDRAMINE HCL 25 MG PO CAPS
50.0000 mg | ORAL_CAPSULE | Freq: Once | ORAL | Status: AC
  Administered 2022-04-06: 50 mg via ORAL
  Filled 2022-04-06: qty 2

## 2022-04-06 MED ORDER — SODIUM CHLORIDE 0.9 % IV SOLN
200.0000 mg | Freq: Once | INTRAVENOUS | Status: AC
  Administered 2022-04-06: 200 mg via INTRAVENOUS
  Filled 2022-04-06: qty 8

## 2022-04-06 MED ORDER — LEUCOVORIN CALCIUM INJECTION 350 MG
400.0000 mg/m2 | Freq: Once | INTRAVENOUS | Status: AC
  Administered 2022-04-06: 696 mg via INTRAVENOUS
  Filled 2022-04-06: qty 34.8

## 2022-04-06 MED ORDER — DEXTROSE 5 % IV SOLN: Freq: Once | INTRAVENOUS | Status: AC

## 2022-04-06 MED ORDER — TRASTUZUMAB-ANNS CHEMO 150 MG IV SOLR
6.0000 mg/kg | Freq: Once | INTRAVENOUS | Status: AC
  Administered 2022-04-06: 378 mg via INTRAVENOUS
  Filled 2022-04-06: qty 18

## 2022-04-06 MED ORDER — ACETAMINOPHEN 325 MG PO TABS
650.0000 mg | ORAL_TABLET | Freq: Once | ORAL | Status: AC
  Administered 2022-04-06: 650 mg via ORAL
  Filled 2022-04-06: qty 2

## 2022-04-06 MED ORDER — SODIUM CHLORIDE 0.9 % IV SOLN: Freq: Once | INTRAVENOUS | Status: AC

## 2022-04-06 MED ORDER — OXALIPLATIN CHEMO INJECTION 100 MG/20ML
85.0000 mg/m2 | Freq: Once | INTRAVENOUS | Status: AC
  Administered 2022-04-06: 150 mg via INTRAVENOUS
  Filled 2022-04-06: qty 20

## 2022-04-06 NOTE — Progress Notes (Signed)
Per Dr. Benay Spice: OK to treat today based on labs collected on 03/23/2022

## 2022-04-06 NOTE — Progress Notes (Signed)
Ok to stop pump early at 1:30 on Saturday April 08, 2022. Per Dr Benay Spice

## 2022-04-06 NOTE — Patient Instructions (Addendum)
[name] [name]  The chemotherapy medication bag should finish at 46 hours, 96 hours, or 7 days. For example, if your pump is scheduled for 46 hours and it was put on at 4:00 p.m., it should finish at 2:00 p.m. the day it is scheduled to come off regardless of your appointment time.     Estimated time to finish at 1:30 Saturday, April 08, 2022.   If the display on your pump reads "Low Volume" and it is beeping, take the batteries out of the pump and come to the cancer center for it to be taken off.   If the pump alarms go off prior to the pump reading "Low Volume" then call [phone] and someone can assist you.  If the plunger comes out and the chemotherapy medication is leaking out, please use your home chemo spill kit to clean up the spill. Do NOT use paper towels or other household products.  If you have problems or questions regarding your pump, please call either [phone] (24 hours a day) or the cancer center Monday-Friday 8:00 a.m.- 4:30 p.m. at the clinic number and we will assist you. If you are unable to get assistance, then go to the nearest Emergency Department and ask the staff to contact the IV team for assistance.   Discharge Instructions: Thank you for choosing [facility] to provide your oncology and hematology care.   If you have a lab appointment with the [facility], please go directly to the [facility] and check in at the registration area.   Wear comfortable clothing and clothing appropriate for easy access to any Portacath or PICC line.   We strive to give you quality time with your provider. You may need to reschedule your appointment if you arrive late (15 or more minutes).  Arriving late affects you and other patients whose appointments are after yours.  Also, if you miss three or more appointments without notifying the office, you may be dismissed from the clinic at the provider's discretion.      For prescription  refill requests, have your pharmacy contact our office and allow 72 hours for refills to be completed.    Today you received the following chemotherapy and/or immunotherapy agents Keytruda, Trastuzumab, Oxaliplatin, Leucovorin, Fluorouracil      To help prevent nausea and vomiting after your treatment, we encourage you to take your nausea medication as directed.  BELOW ARE SYMPTOMS THAT SHOULD BE REPORTED IMMEDIATELY: *FEVER GREATER THAN 100.4 F (38 C) OR HIGHER *CHILLS OR SWEATING *NAUSEA AND VOMITING THAT IS NOT CONTROLLED WITH YOUR NAUSEA MEDICATION *UNUSUAL SHORTNESS OF BREATH *UNUSUAL BRUISING OR BLEEDING *URINARY PROBLEMS (pain or burning when urinating, or frequent urination) *BOWEL PROBLEMS (unusual diarrhea, constipation, pain near the anus) TENDERNESS IN MOUTH AND THROAT WITH OR WITHOUT PRESENCE OF ULCERS (sore throat, sores in mouth, or a toothache) UNUSUAL RASH, SWELLING OR PAIN  UNUSUAL VAGINAL DISCHARGE OR ITCHING   Items with * indicate a potential emergency and should be followed up as soon as possible or go to the Emergency Department if any problems should occur.  Please show the CHEMOTHERAPY ALERT CARD or IMMUNOTHERAPY ALERT CARD at check-in to the Emergency Department and triage nurse.  Should you have questions after your visit or need to cancel or reschedule your appointment, please contact [facility]  Dept: [phone]  and follow the prompts.  Office hours are 8:00 a.m. to 4:30 p.m. Monday - Friday. Please note  that voicemails left after 4:00 p.m. may not be returned until the following business day.  We are closed weekends and major holidays. You have access to a nurse at all times for urgent questions. Please call the main number to the clinic Dept: [phone] and follow the prompts.   For any non-urgent questions, you may also contact your provider using MyChart. We now offer e-Visits for anyone 18 and older to request care online  for non-urgent symptoms. For details visit [email or web address].   Also download the MyChart app! Go to the app store, search "MyChart", open the app, select [facility], and log in with your MyChart username and password.  Masks are optional in the cancer centers. If you would like for your care team to wear a mask while they are taking care of you, please let them know. You may have one support person who is at least 62 years old accompany you for your appointments.  Pembrolizumab Injection What is this medication? PEMBROLIZUMAB (PEM broe LIZ ue mab) treats some types of cancer. It works by helping your immune system slow or stop the spread of cancer cells. It is a monoclonal antibody. This medicine may be used for other purposes; ask your health care provider or pharmacist if you have questions. COMMON BRAND NAME(S): Keytruda What should I tell my care team before I take this medication? They need to know if you have any of these conditions: Allogeneic stem cell transplant (uses someone else's stem cells) Autoimmune diseases, such as Crohn disease, ulcerative colitis, lupus History of chest radiation Nervous system problems, such as Guillain-Barre syndrome, myasthenia gravis Organ transplant An unusual or allergic reaction to pembrolizumab, other medications, foods, dyes, or preservatives Pregnant or trying to get pregnant Breast-feeding How should I use this medication? This medication is injected into a vein. It is given by your care team in a hospital or clinic setting. A special MedGuide will be given to you before each treatment. Be sure to read this information carefully each time. Talk to your care team about the use of this medication in children. While it may be prescribed for children as young as 6 months for selected conditions, precautions do apply. Overdosage: If you think you have taken too much of this medicine contact a poison control center or emergency room at  once. NOTE: This medicine is only for you. Do not share this medicine with others. What if I miss a dose? Keep appointments for follow-up doses. It is important not to miss your dose. Call your care team if you are unable to keep an appointment. What may interact with this medication? Interactions have not been studied. This list may not describe all possible interactions. Give your health care provider a list of all the medicines, herbs, non-prescription drugs, or dietary supplements you use. Also tell them if you smoke, drink alcohol, or use illegal drugs. Some items may interact with your medicine. What should I watch for while using this medication? Your condition will be monitored carefully while you are receiving this medication. You may need blood work while taking this medication. This medication may cause serious skin reactions. They can happen weeks to months after starting the medication. Contact your care team right away if you notice fevers or flu-like symptoms with a rash. The rash may be red or purple and then turn into blisters or peeling of the skin. You may also notice a red rash with swelling of the face, lips, or lymph nodes in your neck  or under your arms. Tell your care team right away if you have any change in your eyesight. Talk to your care team if you may be pregnant. Serious birth defects can occur if you take this medication during pregnancy and for 4 months after the last dose. You will need a negative pregnancy test before starting this medication. Contraception is recommended while taking this medication and for 4 months after the last dose. Your care team can help you find the option that works for you. Do not breastfeed while taking this medication and for 4 months after the last dose. What side effects may I notice from receiving this medication? Side effects that you should report to your care team as soon as possible: Allergic reactions--skin rash, itching, hives,  swelling of the face, lips, tongue, or throat Dry cough, shortness of breath or trouble breathing Eye pain, redness, irritation, or discharge with blurry or decreased vision Heart muscle inflammation--unusual weakness or fatigue, shortness of breath, chest pain, fast or irregular heartbeat, dizziness, swelling of the ankles, feet, or hands Hormone gland problems--headache, sensitivity to light, unusual weakness or fatigue, dizziness, fast or irregular heartbeat, increased sensitivity to cold or heat, excessive sweating, constipation, hair loss, increased thirst or amount of urine, tremors or shaking, irritability Infusion reactions--chest pain, shortness of breath or trouble breathing, feeling faint or lightheaded Kidney injury (glomerulonephritis)--decrease in the amount of urine, red or dark brown urine, foamy or bubbly urine, swelling of the ankles, hands, or feet Liver injury--right upper belly pain, loss of appetite, nausea, light-colored stool, dark yellow or brown urine, yellowing skin or eyes, unusual weakness or fatigue Pain, tingling, or numbness in the hands or feet, muscle weakness, change in vision, confusion or trouble speaking, loss of balance or coordination, trouble walking, seizures Rash, fever, and swollen lymph nodes Redness, blistering, peeling, or loosening of the skin, including inside the mouth Sudden or severe stomach pain, bloody diarrhea, fever, nausea, vomiting Side effects that usually do not require medical attention (report to your care team if they continue or are bothersome): Bone, joint, or muscle pain Diarrhea Fatigue Loss of appetite Nausea Skin rash This list may not describe all possible side effects. Call your doctor for medical advice about side effects. You may report side effects to FDA at 1-800-FDA-1088. Where should I keep my medication? This medication is given in a hospital or clinic. It will not be stored at home. NOTE: This sheet is a summary. It  may not cover all possible information. If you have questions about this medicine, talk to your doctor, pharmacist, or health care provider.  2023 Elsevier/Gold Standard (2021-10-03 00:00:00)  Trastuzumab Injection What is this medication? TRASTUZUMAB (tras TOO zoo mab) treats breast cancer and stomach cancer. It works by blocking a protein that causes cancer cells to grow and multiply. This helps to slow or stop the spread of cancer cells. This medicine may be used for other purposes; ask your health care provider or pharmacist if you have questions. COMMON BRAND NAME(S): Herceptin, Janae Bridgeman, Ontruzant, Trazimera What should I tell my care team before I take this medication? They need to know if you have any of these conditions: Heart failure Lung disease An unusual or allergic reaction to trastuzumab, other medications, foods, dyes, or preservatives Pregnant or trying to get pregnant Breast-feeding How should I use this medication? This medication is injected into a vein. It is given by your care team in a hospital or clinic setting. Talk to your care team  about the use of this medication in children. It is not approved for use in children. Overdosage: If you think you have taken too much of this medicine contact a poison control center or emergency room at once. NOTE: This medicine is only for you. Do not share this medicine with others. What if I miss a dose? Keep appointments for follow-up doses. It is important not to miss your dose. Call your care team if you are unable to keep an appointment. What may interact with this medication? Certain types of chemotherapy, such as daunorubicin, doxorubicin, epirubicin, idarubicin This list may not describe all possible interactions. Give your health care provider a list of all the medicines, herbs, non-prescription drugs, or dietary supplements you use. Also tell them if you smoke, drink alcohol, or use illegal drugs. Some  items may interact with your medicine. What should I watch for while using this medication? Your condition will be monitored carefully while you are receiving this medication. This medication may make you feel generally unwell. This is not uncommon, as chemotherapy affects healthy cells as well as cancer cells. Report any side effects. Continue your course of treatment even though you feel ill unless your care team tells you to stop. This medication may increase your risk of getting an infection. Call your care team for advice if you get a fever, chills, sore throat, or other symptoms of a cold or flu. Do not treat yourself. Try to avoid being around people who are sick. Avoid taking medications that contain aspirin, acetaminophen, ibuprofen, naproxen, or ketoprofen unless instructed by your care team. These medications can hide a fever. Talk to your care team if you may be pregnant. Serious birth defects can occur if you take this medication during pregnancy and for 7 months after the last dose. You will need a negative pregnancy test before starting this medication. Contraception is recommended while taking this medication and for 7 months after the last dose. Your care team can help you find the option that works for you. Do not breastfeed while taking this medication and for 7 months after stopping treatment. What side effects may I notice from receiving this medication? Side effects that you should report to your care team as soon as possible: Allergic reactions or angioedema--skin rash, itching or hives, swelling of the face, eyes, lips, tongue, arms, or legs, trouble swallowing or breathing Dry cough, shortness of breath or trouble breathing Heart failure--shortness of breath, swelling of the ankles, feet, or hands, sudden weight gain, unusual weakness or fatigue Infection--fever, chills, cough, or sore throat Infusion reactions--chest pain, shortness of breath or trouble breathing, feeling  faint or lightheaded Side effects that usually do not require medical attention (report to your care team if they continue or are bothersome): Diarrhea Dizziness Headache Nausea Trouble sleeping Vomiting This list may not describe all possible side effects. Call your doctor for medical advice about side effects. You may report side effects to FDA at 1-800-FDA-1088. Where should I keep my medication? This medication is given in a hospital or clinic. It will not be stored at home. NOTE: This sheet is a summary. It may not cover all possible information. If you have questions about this medicine, talk to your doctor, pharmacist, or health care provider.  2023 Elsevier/Gold Standard (2021-10-25 00:00:00)  Oxaliplatin Injection What is this medication? OXALIPLATIN (ox AL i PLA tin) treats some types of cancer. It works by slowing down the growth of cancer cells. This medicine may be used for other  purposes; ask your health care provider or pharmacist if you have questions. COMMON BRAND NAME(S): Eloxatin What should I tell my care team before I take this medication? They need to know if you have any of these conditions: Heart disease History of irregular heartbeat or rhythm Liver disease Low blood cell levels (white cells, red cells, and platelets) Lung or breathing disease, such as asthma Take medications that treat or prevent blood clots Tingling of the fingers, toes, or other nerve disorder An unusual or allergic reaction to oxaliplatin, other medications, foods, dyes, or preservatives If you or your partner are pregnant or trying to get pregnant Breast-feeding How should I use this medication? This medication is injected into a vein. It is given by your care team in a hospital or clinic setting. Talk to your care team about the use of this medication in children. Special care may be needed. Overdosage: If you think you have taken too much of this medicine contact a poison control  center or emergency room at once. NOTE: This medicine is only for you. Do not share this medicine with others. What if I miss a dose? Keep appointments for follow-up doses. It is important not to miss a dose. Call your care team if you are unable to keep an appointment. What may interact with this medication? Do not take this medication with any of the following: Cisapride Dronedarone Pimozide Thioridazine This medication may also interact with the following: Aspirin and aspirin-like medications Certain medications that treat or prevent blood clots, such as warfarin, apixaban, dabigatran, and rivaroxaban Cisplatin Cyclosporine Diuretics Medications for infection, such as acyclovir, adefovir, amphotericin B, bacitracin, cidofovir, foscarnet, ganciclovir, gentamicin, pentamidine, vancomycin NSAIDs, medications for pain and inflammation, such as ibuprofen or naproxen Other medications that cause heart rhythm changes Pamidronate Zoledronic acid This list may not describe all possible interactions. Give your health care provider a list of all the medicines, herbs, non-prescription drugs, or dietary supplements you use. Also tell them if you smoke, drink alcohol, or use illegal drugs. Some items may interact with your medicine. What should I watch for while using this medication? Your condition will be monitored carefully while you are receiving this medication. You may need blood work while taking this medication. This medication may make you feel generally unwell. This is not uncommon as chemotherapy can affect healthy cells as well as cancer cells. Report any side effects. Continue your course of treatment even though you feel ill unless your care team tells you to stop. This medication may increase your risk of getting an infection. Call your care team for advice if you get a fever, chills, sore throat, or other symptoms of a cold or flu. Do not treat yourself. Try to avoid being around  people who are sick. Avoid taking medications that contain aspirin, acetaminophen, ibuprofen, naproxen, or ketoprofen unless instructed by your care team. These medications may hide a fever. Be careful brushing or flossing your teeth or using a toothpick because you may get an infection or bleed more easily. If you have any dental work done, tell your dentist you are receiving this medication. This medication can make you more sensitive to cold. Do not drink cold drinks or use ice. Cover exposed skin before coming in contact with cold temperatures or cold objects. When out in cold weather wear warm clothing and cover your mouth and nose to warm the air that goes into your lungs. Tell your care team if you get sensitive to the cold. Talk to  your care team if you or your partner are pregnant or think either of you might be pregnant. This medication can cause serious birth defects if taken during pregnancy and for 9 months after the last dose. A negative pregnancy test is required before starting this medication. A reliable form of contraception is recommended while taking this medication and for 9 months after the last dose. Talk to your care team about effective forms of contraception. Do not father a child while taking this medication and for 6 months after the last dose. Use a condom while having sex during this time period. Do not breastfeed while taking this medication and for 3 months after the last dose. This medication may cause infertility. Talk to your care team if you are concerned about your fertility. What side effects may I notice from receiving this medication? Side effects that you should report to your care team as soon as possible: Allergic reactions--skin rash, itching, hives, swelling of the face, lips, tongue, or throat Bleeding--bloody or black, tar-like stools, vomiting blood or brown material that looks like coffee grounds, red or dark brown urine, small red or purple spots on skin,  unusual bruising or bleeding Dry cough, shortness of breath or trouble breathing Heart rhythm changes--fast or irregular heartbeat, dizziness, feeling faint or lightheaded, chest pain, trouble breathing Infection--fever, chills, cough, sore throat, wounds that don't heal, pain or trouble when passing urine, general feeling of discomfort or being unwell Liver injury--right upper belly pain, loss of appetite, nausea, light-colored stool, dark yellow or brown urine, yellowing skin or eyes, unusual weakness or fatigue Low red blood cell level--unusual weakness or fatigue, dizziness, headache, trouble breathing Muscle injury--unusual weakness or fatigue, muscle pain, dark yellow or brown urine, decrease in amount of urine Pain, tingling, or numbness in the hands or feet Sudden and severe headache, confusion, change in vision, seizures, which may be signs of posterior reversible encephalopathy syndrome (PRES) Unusual bruising or bleeding Side effects that usually do not require medical attention (report to your care team if they continue or are bothersome): Diarrhea Nausea Pain, redness, or swelling with sores inside the mouth or throat Unusual weakness or fatigue Vomiting This list may not describe all possible side effects. Call your doctor for medical advice about side effects. You may report side effects to FDA at 1-800-FDA-1088. Where should I keep my medication? This medication is given in a hospital or clinic. It will not be stored at home. NOTE: This sheet is a summary. It may not cover all possible information. If you have questions about this medicine, talk to your doctor, pharmacist, or health care provider.  2023 Elsevier/Gold Standard (2021-10-07 00:00:00)  Leucovorin Injection What is this medication? LEUCOVORIN (loo koe VOR in) prevents side effects from certain medications, such as methotrexate. It works by increasing folate levels. This helps protect healthy cells in your body. It  may also be used to treat anemia caused by low levels of folate. It can also be used with fluorouracil, a type of chemotherapy, to treat colorectal cancer. It works by increasing the effects of fluorouracil in the body. This medicine may be used for other purposes; ask your health care provider or pharmacist if you have questions. What should I tell my care team before I take this medication? They need to know if you have any of these conditions: Anemia from low levels of vitamin B12 in the blood An unusual or allergic reaction to leucovorin, folic acid, other medications, foods, dyes, or preservatives Pregnant  or trying to get pregnant Breastfeeding How should I use this medication? This medication is injected into a vein or a muscle. It is given by your care team in a hospital or clinic setting. Talk to your care team about the use of this medication in children. Special care may be needed. Overdosage: If you think you have taken too much of this medicine contact a poison control center or emergency room at once. NOTE: This medicine is only for you. Do not share this medicine with others. What if I miss a dose? Keep appointments for follow-up doses. It is important not to miss your dose. Call your care team if you are unable to keep an appointment. What may interact with this medication? Capecitabine Fluorouracil Phenobarbital Phenytoin Primidone Trimethoprim;sulfamethoxazole This list may not describe all possible interactions. Give your health care provider a list of all the medicines, herbs, non-prescription drugs, or dietary supplements you use. Also tell them if you smoke, drink alcohol, or use illegal drugs. Some items may interact with your medicine. What should I watch for while using this medication? Your condition will be monitored carefully while you are receiving this medication. This medication may increase the side effects of 5-fluorouracil. Tell your care team if you have  diarrhea or mouth sores that do not get better or that get worse. What side effects may I notice from receiving this medication? Side effects that you should report to your care team as soon as possible: Allergic reactions--skin rash, itching, hives, swelling of the face, lips, tongue, or throat This list may not describe all possible side effects. Call your doctor for medical advice about side effects. You may report side effects to FDA at 1-800-FDA-1088. Where should I keep my medication? This medication is given in a hospital or clinic. It will not be stored at home. NOTE: This sheet is a summary. It may not cover all possible information. If you have questions about this medicine, talk to your doctor, pharmacist, or health care provider.  2023 Elsevier/Gold Standard (2021-10-21 00:00:00)  Fluorouracil Injection What is this medication? FLUOROURACIL (flure oh YOOR a sil) treats some types of cancer. It works by slowing down the growth of cancer cells. This medicine may be used for other purposes; ask your health care provider or pharmacist if you have questions. COMMON BRAND NAME(S): Adrucil What should I tell my care team before I take this medication? They need to know if you have any of these conditions: Blood disorders Dihydropyrimidine dehydrogenase (DPD) deficiency Infection, such as chickenpox, cold sores, herpes Kidney disease Liver disease Poor nutrition Recent or ongoing radiation therapy An unusual or allergic reaction to fluorouracil, other medications, foods, dyes, or preservatives If you or your partner are pregnant or trying to get pregnant Breast-feeding How should I use this medication? This medication is injected into a vein. It is administered by your care team in a hospital or clinic setting. Talk to your care team about the use of this medication in children. Special care may be needed. Overdosage: If you think you have taken too much of this medicine contact a  poison control center or emergency room at once. NOTE: This medicine is only for you. Do not share this medicine with others. What if I miss a dose? Keep appointments for follow-up doses. It is important not to miss your dose. Call your care team if you are unable to keep an appointment. What may interact with this medication? Do not take this medication with any of  the following: Live virus vaccines This medication may also interact with the following: Medications that treat or prevent blood clots, such as warfarin, enoxaparin, dalteparin This list may not describe all possible interactions. Give your health care provider a list of all the medicines, herbs, non-prescription drugs, or dietary supplements you use. Also tell them if you smoke, drink alcohol, or use illegal drugs. Some items may interact with your medicine. What should I watch for while using this medication? Your condition will be monitored carefully while you are receiving this medication. This medication may make you feel generally unwell. This is not uncommon as chemotherapy can affect healthy cells as well as cancer cells. Report any side effects. Continue your course of treatment even though you feel ill unless your care team tells you to stop. In some cases, you may be given additional medications to help with side effects. Follow all directions for their use. This medication may increase your risk of getting an infection. Call your care team for advice if you get a fever, chills, sore throat, or other symptoms of a cold or flu. Do not treat yourself. Try to avoid being around people who are sick. This medication may increase your risk to bruise or bleed. Call your care team if you notice any unusual bleeding. Be careful brushing or flossing your teeth or using a toothpick because you may get an infection or bleed more easily. If you have any dental work done, tell your dentist you are receiving this medication. Avoid taking  medications that contain aspirin, acetaminophen, ibuprofen, naproxen, or ketoprofen unless instructed by your care team. These medications may hide a fever. Do not treat diarrhea with over the counter products. Contact your care team if you have diarrhea that lasts more than 2 days or if it is severe and watery. This medication can make you more sensitive to the sun. Keep out of the sun. If you cannot avoid being in the sun, wear protective clothing and sunscreen. Do not use sun lamps, tanning beds, or tanning booths. Talk to your care team if you or your partner wish to become pregnant or think you might be pregnant. This medication can cause serious birth defects if taken during pregnancy and for 3 months after the last dose. A reliable form of contraception is recommended while taking this medication and for 3 months after the last dose. Talk to your care team about effective forms of contraception. Do not father a child while taking this medication and for 3 months after the last dose. Use a condom while having sex during this time period. Do not breastfeed while taking this medication. This medication may cause infertility. Talk to your care team if you are concerned about your fertility. What side effects may I notice from receiving this medication? Side effects that you should report to your care team as soon as possible: Allergic reactions--skin rash, itching, hives, swelling of the face, lips, tongue, or throat Heart attack--pain or tightness in the chest, shoulders, arms, or jaw, nausea, shortness of breath, cold or clammy skin, feeling faint or lightheaded Heart failure--shortness of breath, swelling of the ankles, feet, or hands, sudden weight gain, unusual weakness or fatigue Heart rhythm changes--fast or irregular heartbeat, dizziness, feeling faint or lightheaded, chest pain, trouble breathing High ammonia level--unusual weakness or fatigue, confusion, loss of appetite, nausea, vomiting,  seizures Infection--fever, chills, cough, sore throat, wounds that don't heal, pain or trouble when passing urine, general feeling of discomfort or being unwell Low red  blood cell level--unusual weakness or fatigue, dizziness, headache, trouble breathing Pain, tingling, or numbness in the hands or feet, muscle weakness, change in vision, confusion or trouble speaking, loss of balance or coordination, trouble walking, seizures Redness, swelling, and blistering of the skin over hands and feet Severe or prolonged diarrhea Unusual bruising or bleeding Side effects that usually do not require medical attention (report to your care team if they continue or are bothersome): Dry skin Headache Increased tears Nausea Pain, redness, or swelling with sores inside the mouth or throat Sensitivity to light Vomiting This list may not describe all possible side effects. Call your doctor for medical advice about side effects. You may report side effects to FDA at 1-800-FDA-1088. Where should I keep my medication? This medication is given in a hospital or clinic. It will not be stored at home. NOTE: This sheet is a summary. It may not cover all possible information. If you have questions about this medicine, talk to your doctor, pharmacist, or health care provider.  2023 Elsevier/Gold Standard (2021-10-18 00:00:00)

## 2022-04-07 ENCOUNTER — Telehealth: Payer: Self-pay

## 2022-04-07 NOTE — Telephone Encounter (Signed)
Attempted to call patient's cell phone number for first time chemo follow-up.  There was no answer and voicemail was full.   Called second number listed for patient contact info and spoke to Rhetta Mura, who is on patient's ROI list.  Per Timmothy Sours, he had not checked to see how Mr. Arts was doing today, but stated he did remind Mr. Kreitzer to call office if he needed anything or had any questions.  Mr. Gabriel Earing stated he would reach out to Mr. Littlejohn later today to check on him and agreed to instruct patient to contact office if he had any questions or concerns.  Mr. Gabriel Earing also stated he will be driving Mr. Letizia to his pump stop appointment on 04/08/22 and time of appointment was confirmed.

## 2022-04-08 ENCOUNTER — Inpatient Hospital Stay: Payer: Medicare Other

## 2022-04-08 VITALS — BP 122/87 | HR 87 | Temp 97.8°F | Resp 16

## 2022-04-08 DIAGNOSIS — Z5111 Encounter for antineoplastic chemotherapy: Secondary | ICD-10-CM | POA: Diagnosis not present

## 2022-04-08 DIAGNOSIS — Z79899 Other long term (current) drug therapy: Secondary | ICD-10-CM | POA: Diagnosis not present

## 2022-04-08 DIAGNOSIS — C801 Malignant (primary) neoplasm, unspecified: Secondary | ICD-10-CM

## 2022-04-08 DIAGNOSIS — C787 Secondary malignant neoplasm of liver and intrahepatic bile duct: Secondary | ICD-10-CM | POA: Diagnosis not present

## 2022-04-08 DIAGNOSIS — C155 Malignant neoplasm of lower third of esophagus: Secondary | ICD-10-CM

## 2022-04-08 MED ORDER — SODIUM CHLORIDE 0.9% FLUSH
10.0000 mL | INTRAVENOUS | Status: DC | PRN
  Administered 2022-04-08: 10 mL

## 2022-04-08 MED ORDER — HEPARIN SOD (PORK) LOCK FLUSH 100 UNIT/ML IV SOLN
500.0000 [IU] | Freq: Once | INTRAVENOUS | Status: AC | PRN
  Administered 2022-04-08: 500 [IU]

## 2022-04-09 ENCOUNTER — Emergency Department (HOSPITAL_COMMUNITY): Payer: Medicare Other

## 2022-04-09 ENCOUNTER — Encounter (HOSPITAL_COMMUNITY): Payer: Self-pay | Admitting: Emergency Medicine

## 2022-04-09 ENCOUNTER — Observation Stay (HOSPITAL_COMMUNITY)
Admission: EM | Admit: 2022-04-09 | Discharge: 2022-04-11 | Disposition: A | Discharge status: Home / Self Care | Payer: Medicare Other | Attending: Internal Medicine | Admitting: Internal Medicine

## 2022-04-09 DIAGNOSIS — R269 Unspecified abnormalities of gait and mobility: Secondary | ICD-10-CM | POA: Diagnosis not present

## 2022-04-09 DIAGNOSIS — Z85118 Personal history of other malignant neoplasm of bronchus and lung: Secondary | ICD-10-CM | POA: Insufficient documentation

## 2022-04-09 DIAGNOSIS — Z79899 Other long term (current) drug therapy: Secondary | ICD-10-CM | POA: Diagnosis not present

## 2022-04-09 DIAGNOSIS — I2510 Atherosclerotic heart disease of native coronary artery without angina pectoris: Secondary | ICD-10-CM | POA: Diagnosis not present

## 2022-04-09 DIAGNOSIS — Z86718 Personal history of other venous thrombosis and embolism: Secondary | ICD-10-CM | POA: Insufficient documentation

## 2022-04-09 DIAGNOSIS — Z7409 Other reduced mobility: Secondary | ICD-10-CM | POA: Insufficient documentation

## 2022-04-09 DIAGNOSIS — Z86711 Personal history of pulmonary embolism: Secondary | ICD-10-CM | POA: Diagnosis not present

## 2022-04-09 DIAGNOSIS — Z7901 Long term (current) use of anticoagulants: Secondary | ICD-10-CM | POA: Insufficient documentation

## 2022-04-09 DIAGNOSIS — R2681 Unsteadiness on feet: Secondary | ICD-10-CM | POA: Diagnosis not present

## 2022-04-09 DIAGNOSIS — Z87891 Personal history of nicotine dependence: Secondary | ICD-10-CM | POA: Insufficient documentation

## 2022-04-09 DIAGNOSIS — I10 Essential (primary) hypertension: Secondary | ICD-10-CM | POA: Diagnosis not present

## 2022-04-09 DIAGNOSIS — Z7902 Long term (current) use of antithrombotics/antiplatelets: Secondary | ICD-10-CM | POA: Diagnosis not present

## 2022-04-09 DIAGNOSIS — I214 Non-ST elevation (NSTEMI) myocardial infarction: Secondary | ICD-10-CM

## 2022-04-09 DIAGNOSIS — R079 Chest pain, unspecified: Principal | ICD-10-CM | POA: Insufficient documentation

## 2022-04-09 DIAGNOSIS — Z8616 Personal history of COVID-19: Secondary | ICD-10-CM | POA: Diagnosis not present

## 2022-04-09 DIAGNOSIS — R7989 Other specified abnormal findings of blood chemistry: Secondary | ICD-10-CM | POA: Insufficient documentation

## 2022-04-09 DIAGNOSIS — R0602 Shortness of breath: Secondary | ICD-10-CM | POA: Diagnosis not present

## 2022-04-09 LAB — CBC WITH DIFFERENTIAL/PLATELET
Abs Immature Granulocytes: 0.03 10*3/uL (ref 0.00–0.07)
Basophils Absolute: 0 10*3/uL (ref 0.0–0.1)
Basophils Relative: 1 %
Eosinophils Absolute: 0.2 10*3/uL (ref 0.0–0.5)
Eosinophils Relative: 4 %
HCT: 35.3 % — ABNORMAL LOW (ref 39.0–52.0)
Hemoglobin: 10.9 g/dL — ABNORMAL LOW (ref 13.0–17.0)
Immature Granulocytes: 1 %
Lymphocytes Relative: 13 %
Lymphs Abs: 0.8 10*3/uL (ref 0.7–4.0)
MCH: 25.4 pg — ABNORMAL LOW (ref 26.0–34.0)
MCHC: 30.9 g/dL (ref 30.0–36.0)
MCV: 82.3 fL (ref 80.0–100.0)
Monocytes Absolute: 0.1 10*3/uL (ref 0.1–1.0)
Monocytes Relative: 2 %
Neutro Abs: 4.7 10*3/uL (ref 1.7–7.7)
Neutrophils Relative %: 79 %
Platelets: 159 10*3/uL (ref 150–400)
RBC: 4.29 MIL/uL (ref 4.22–5.81)
RDW: 16 % — ABNORMAL HIGH (ref 11.5–15.5)
WBC: 5.9 10*3/uL (ref 4.0–10.5)
nRBC: 0 % (ref 0.0–0.2)

## 2022-04-09 LAB — URINALYSIS, ROUTINE W REFLEX MICROSCOPIC
Bacteria, UA: NONE SEEN
Bilirubin Urine: NEGATIVE
Glucose, UA: NEGATIVE mg/dL
Hgb urine dipstick: NEGATIVE
Ketones, ur: NEGATIVE mg/dL
Leukocytes,Ua: NEGATIVE
Nitrite: NEGATIVE
Protein, ur: 30 mg/dL — AB
Specific Gravity, Urine: 1.021 (ref 1.005–1.030)
pH: 5 (ref 5.0–8.0)

## 2022-04-09 LAB — COMPREHENSIVE METABOLIC PANEL
ALT: 20 U/L (ref 0–44)
AST: 26 U/L (ref 15–41)
Albumin: 3 g/dL — ABNORMAL LOW (ref 3.5–5.0)
Alkaline Phosphatase: 297 U/L — ABNORMAL HIGH (ref 38–126)
Anion gap: 11 (ref 5–15)
BUN: 24 mg/dL — ABNORMAL HIGH (ref 8–23)
CO2: 24 mmol/L (ref 22–32)
Calcium: 8.7 mg/dL — ABNORMAL LOW (ref 8.9–10.3)
Chloride: 98 mmol/L (ref 98–111)
Creatinine, Ser: 0.95 mg/dL (ref 0.61–1.24)
GFR, Estimated: >60 mL/min (ref >60)
Glucose, Bld: 103 mg/dL — ABNORMAL HIGH (ref 70–99)
Potassium: 3.6 mmol/L (ref 3.5–5.1)
Sodium: 133 mmol/L — ABNORMAL LOW (ref 135–145)
Total Bilirubin: 0.7 mg/dL (ref 0.3–1.2)
Total Protein: 7.3 g/dL (ref 6.5–8.1)

## 2022-04-09 LAB — TROPONIN I (HIGH SENSITIVITY)
Troponin I (High Sensitivity): 529 ng/L (ref <18)
Troponin I (High Sensitivity): 441 ng/L (ref <18)

## 2022-04-09 LAB — LIPASE, BLOOD: Lipase: 67 U/L — ABNORMAL HIGH (ref 11–51)

## 2022-04-09 MED ORDER — HEPARIN (PORCINE) 25000 UT/250ML-% IV SOLN
800.0000 [IU]/h | INTRAVENOUS | Status: DC
  Administered 2022-04-09: 800 [IU]/h via INTRAVENOUS
  Filled 2022-04-09: qty 250

## 2022-04-09 MED ORDER — SENNOSIDES-DOCUSATE SODIUM 8.6-50 MG PO TABS: 1.0000 | ORAL_TABLET | Freq: Every evening | ORAL | Status: DC | PRN

## 2022-04-09 MED ORDER — OXYCODONE-ACETAMINOPHEN 5-325 MG PO TABS
1.0000 | ORAL_TABLET | Freq: Once | ORAL | Status: AC
  Administered 2022-04-09: 1 via ORAL
  Filled 2022-04-09: qty 1

## 2022-04-09 MED ORDER — ONDANSETRON HCL 4 MG/2ML IJ SOLN: 4.0000 mg | Freq: Four times a day (QID) | INTRAMUSCULAR | Status: DC | PRN

## 2022-04-09 MED ORDER — APIXABAN 5 MG PO TABS
5.0000 mg | ORAL_TABLET | Freq: Two times a day (BID) | ORAL | Status: DC
  Administered 2022-04-09 – 2022-04-11 (×4): 5 mg via ORAL
  Filled 2022-04-09 (×4): qty 1

## 2022-04-09 MED ORDER — SODIUM CHLORIDE 0.9 % IV BOLUS
1000.0000 mL | Freq: Once | INTRAVENOUS | Status: AC
  Administered 2022-04-09: 1000 mL via INTRAVENOUS

## 2022-04-09 MED ORDER — OXYCODONE HCL 5 MG PO TABS
5.0000 mg | ORAL_TABLET | Freq: Once | ORAL | Status: AC
  Administered 2022-04-09: 5 mg via ORAL
  Filled 2022-04-09: qty 1

## 2022-04-09 MED ORDER — OXYCODONE HCL 5 MG PO TABS
10.0000 mg | ORAL_TABLET | Freq: Four times a day (QID) | ORAL | Status: DC | PRN
  Administered 2022-04-10 – 2022-04-11 (×4): 10 mg via ORAL
  Filled 2022-04-09 (×4): qty 2

## 2022-04-09 MED ORDER — ACETAMINOPHEN 650 MG RE SUPP: 650.0000 mg | Freq: Four times a day (QID) | RECTAL | Status: DC | PRN

## 2022-04-09 MED ORDER — CLOPIDOGREL BISULFATE 75 MG PO TABS
75.0000 mg | ORAL_TABLET | Freq: Every day | ORAL | Status: DC
  Administered 2022-04-10 – 2022-04-11 (×2): 75 mg via ORAL
  Filled 2022-04-09 (×2): qty 1

## 2022-04-09 MED ORDER — ONDANSETRON HCL 4 MG PO TABS: 4.0000 mg | ORAL_TABLET | Freq: Four times a day (QID) | ORAL | Status: DC | PRN

## 2022-04-09 MED ORDER — ATORVASTATIN CALCIUM 40 MG PO TABS
80.0000 mg | ORAL_TABLET | Freq: Every day | ORAL | Status: DC
  Administered 2022-04-09 – 2022-04-10 (×2): 80 mg via ORAL
  Filled 2022-04-09 (×2): qty 2

## 2022-04-09 MED ORDER — ACETAMINOPHEN 325 MG PO TABS: 650.0000 mg | ORAL_TABLET | Freq: Four times a day (QID) | ORAL | Status: DC | PRN

## 2022-04-09 MED ORDER — ONDANSETRON HCL 4 MG/2ML IJ SOLN
4.0000 mg | Freq: Once | INTRAMUSCULAR | Status: AC
  Administered 2022-04-09: 4 mg via INTRAVENOUS
  Filled 2022-04-09: qty 2

## 2022-04-09 MED ORDER — ASPIRIN 81 MG PO CHEW
324.0000 mg | CHEWABLE_TABLET | Freq: Once | ORAL | Status: AC
  Administered 2022-04-09: 324 mg via ORAL
  Filled 2022-04-09: qty 4

## 2022-04-09 NOTE — ED Provider Notes (Signed)
[facility] EMERGENCY DEPARTMENT Provider Note   CSN: [id number] Arrival date & time: 04/09/22  1218     History  Chief Complaint  Patient presents with   Pain Control    [name] [name] is a 61 y.o. male.  HPI C22-year-old male presents with chest pain, shortness of breath, nausea, vomiting, and abdominal pain.  The chest pain and shortness of breath has been on and off for couple days.  Feels like a tightness.  At the time I am talking to him he does not have any pain.  He started having the vomiting and abdominal discomfort (which sounds like nausea) yesterday after chemo.  At times he felt like he had indigestion.  He has a past medical history pertinent for prior myocardial infarction, esophageal cancer, previous cardiac arrest.  Home Medications Prior to Admission medications   Medication Sig Start Date End Date Taking? Authorizing Provider  acetaminophen (TYLENOL) 500 MG tablet Take 1,000 mg by mouth every 6 (six) hours as needed for mild pain.   Yes [provider]  amLODipine (NORVASC) 10 MG tablet Take 10 mg by mouth daily.   Yes [provider]  apixaban (ELIQUIS) 5 MG TABS tablet Take 1 tablet (5 mg total) by mouth 2 (two) times daily. 02/28/22  Yes Mapp, Tavien, MD  atorvastatin (LIPITOR) 80 MG tablet Take 1 tablet (80 mg total) by mouth daily at 6 PM. 02/20/22  Yes Serita Butcher, MD  chlorthalidone (HYGROTON) 25 MG tablet Take 25 mg by mouth daily.   Yes [provider]  diclofenac Sodium (VOLTAREN) 1 % GEL Apply 2-4 g topically in the morning, at noon, in the evening, and at bedtime. 12/26/21  Yes [provider]  docusate sodium (COLACE) 100 MG capsule Take 1 capsule (100 mg total) by mouth daily. 01/27/21  Yes Lyndal Pulley, MD  DULoxetine (CYMBALTA) 30 MG capsule Take 30 mg by mouth daily as needed (depression). 12/26/21  Yes [provider]  feeding supplement (ENSURE ENLIVE / ENSURE PLUS) LIQD Take 237 mLs by  mouth 2 (two) times daily between meals. 03/18/22  Yes Dahal, Marlowe Aschoff, MD  gabapentin (NEURONTIN) 100 MG capsule Take 100 mg by mouth at bedtime as needed (for pain).  12/31/19  Yes [provider]  lidocaine-prilocaine (EMLA) cream Apply 1 Application topically as needed. 03/03/22  Yes Owens Shark, NP  ondansetron (ZOFRAN) 8 MG tablet TAKE 1 TABLET(8 MG) BY MOUTH EVERY 8 HOURS AS NEEDED FOR NAUSEA OR VOMITING Patient taking differently: Take 8 mg by mouth every 8 (eight) hours as needed for nausea or vomiting. 04/05/22  Yes Ladell Pier, MD  oxyCODONE-acetaminophen (PERCOCET) 10-325 MG tablet Take 1 tablet by mouth every 6 (six) hours as needed for pain. 03/30/22 04/29/22 Yes Atway, Rayann N, DO  prochlorperazine (COMPAZINE) 10 MG tablet TAKE 1 TABLET(10 MG) BY MOUTH EVERY 6 HOURS AS NEEDED FOR NAUSEA OR VOMITING Patient taking differently: Take 10 mg by mouth every 6 (six) hours as needed for vomiting or nausea. 04/05/22  Yes Ladell Pier, MD  clopidogrel (PLAVIX) 75 MG tablet Take 1 tablet (75 mg total) by mouth daily. Patient not taking: Reported on 03/12/2022 07/18/21 07/18/22  Delene Ruffini, MD      Allergies    Dilaudid [hydromorphone hcl], Ramipril, and Tramadol    Review of Systems   Review of Systems  Respiratory:  Positive for shortness of breath.   Cardiovascular:  Positive for chest pain.  Gastrointestinal:  Positive  for abdominal pain, nausea and vomiting.    Physical Exam Updated Vital Signs BP 102/82   Pulse (!) 46   Temp 100 F (37.8 C) (Oral)   Resp 13   SpO2 99%  Physical Exam Vitals and nursing note reviewed.  Constitutional:      Appearance: He is well-developed.  HENT:     Head: Normocephalic and atraumatic.  Cardiovascular:     Rate and Rhythm: Normal rate and regular rhythm.     Heart sounds: Normal heart sounds.  Pulmonary:     Effort: Pulmonary effort is normal.     Breath sounds: Normal breath sounds.  Abdominal:     General: There is  no distension.     Palpations: Abdomen is soft.     Tenderness: There is no abdominal tenderness.  Skin:    General: Skin is warm and dry.  Neurological:     Mental Status: He is alert.     ED Results / Procedures / Treatments   Labs (all labs ordered are listed, but only abnormal results are displayed) Labs Reviewed  CBC WITH DIFFERENTIAL/PLATELET - Abnormal; Notable for the following components:      Result Value   Hemoglobin 10.9 (*)    HCT 35.3 (*)    MCH 25.4 (*)    RDW 16.0 (*)    All other components within normal limits  COMPREHENSIVE METABOLIC PANEL - Abnormal; Notable for the following components:   Sodium 133 (*)    Glucose, Bld 103 (*)    BUN 24 (*)    Calcium 8.7 (*)    Albumin 3.0 (*)    Alkaline Phosphatase 297 (*)    All other components within normal limits  LIPASE, BLOOD - Abnormal; Notable for the following components:   Lipase 67 (*)    All other components within normal limits  URINALYSIS, ROUTINE W REFLEX MICROSCOPIC - Abnormal; Notable for the following components:   Protein, ur 30 (*)    All other components within normal limits  TROPONIN I (HIGH SENSITIVITY) - Abnormal; Notable for the following components:   Troponin I (High Sensitivity) 529 (*)    All other components within normal limits  TROPONIN I (HIGH SENSITIVITY) - Abnormal; Notable for the following components:   Troponin I (High Sensitivity) 441 (*)    All other components within normal limits  HEPARIN LEVEL (UNFRACTIONATED)  APTT  CBC    EKG EKG Interpretation  Date/Time:  Sunday April 09 2022 16:02:55 EDT Ventricular Rate:  99 PR Interval:  152 QRS Duration: 88 QT Interval:  350 QTC Calculation: 449 R Axis:   -40 Text Interpretation: Sinus rhythm with occasional Premature ventricular complexes Left axis deviation Inferior infarct , age undetermined Anteroseptal infarct , age undetermined  no significant change since Sept 2023 Confirmed by Sherwood Gambler [phone]) on  04/09/2022 4:20:52 PM  Radiology DG Chest 2 View  Result Date: 04/09/2022 CLINICAL DATA:  Shortness of breath EXAM: CHEST - 2 VIEW COMPARISON:  03/11/2022 CT, chest radiograph and prior studies FINDINGS: Cardiomegaly and RIGHT IJ Port-A-Cath with tip overlying the SUPERIOR cavoatrial junction again noted. There is no evidence of focal airspace disease, pulmonary edema, suspicious pulmonary nodule/mass, pleural effusion, or pneumothorax. No acute bony abnormalities are identified. Remote bilateral rib fractures again noted. IMPRESSION: Cardiomegaly without evidence of acute cardiopulmonary disease. Electronically Signed   By: Margarette Canada M.D.   On: 04/09/2022 16:16    Procedures .Critical Care  Performed by: Sherwood Gambler, MD Authorized by: Regenia Skeeter,  Nicki Reaper, MD   Critical care provider statement:    Critical care time (minutes):  35   Critical care time was exclusive of:  Separately billable procedures and treating other patients   Critical care was necessary to treat or prevent imminent or life-threatening deterioration of the following conditions:  Cardiac failure   Critical care was time spent personally by me on the following activities:  Development of treatment plan with patient or surrogate, discussions with consultants, evaluation of patient's response to treatment, examination of patient, ordering and review of laboratory studies, ordering and review of radiographic studies, ordering and performing treatments and interventions, pulse oximetry, re-evaluation of patient's condition and review of old charts     Medications Ordered in ED Medications  heparin ADULT infusion 100 units/mL (25000 units/249m) (800 Units/hr Intravenous New Bag/Given 04/09/22 1806)  ondansetron (ZOFRAN) injection 4 mg (4 mg Intravenous Given 04/09/22 1756)  aspirin chewable tablet 324 mg (324 mg Oral Given 04/09/22 1719)  sodium chloride 0.9 % bolus 1,000 mL (0 mLs Intravenous Stopped 04/09/22 2005)   oxyCODONE-acetaminophen (PERCOCET/ROXICET) 5-325 MG per tablet 1 tablet (1 tablet Oral Given 04/09/22 1809)  oxyCODONE (Oxy IR/ROXICODONE) immediate release tablet 5 mg (5 mg Oral Given 04/09/22 1809)    ED Course/ Medical Decision Making/ A&P                           Medical Decision Making Amount and/or Complexity of Data Reviewed External Data Reviewed: radiology.    Details: Cath 2 months ago showed 100% RCA lesion Labs: ordered.    Details: Troponins elevated at ~500, now down trending Radiology: ordered and independent interpretation performed.    Details: No CHF ECG/medicine tests: independent interpretation performed.    Details: No acute ischemia  Risk OTC drugs. Prescription drug management. Decision regarding hospitalization.   After patient was given nausea medicine it seems like his abdominal discomfort and pain went away.  I think the primary concern today is the NSTEMI.  Chart review shows that he had a 100% RCA blockage and a 30% other lesion in the circumflex a couple months ago after cardiac arrest.  Unclear if the NSTEMI today is secondary or a primary cause.  He will be started on heparin and was given aspirin.  He is currently pain-free.  At this point, I discussed with cardiology who will consult but asked for medical admission.  Discussed with internal medicine teaching service.        Final Clinical Impression(s) / ED Diagnoses Final diagnoses:  NSTEMI (non-ST elevated myocardial infarction) ([facility] ([place])    Rx / DC Orders ED Discharge Orders     None         GSherwood Gambler MD 04/09/22 2117

## 2022-04-09 NOTE — H&P (Incomplete)
Date: 04/09/2022               Patient Name:  [name] [name] MRN: [id number]  DOB: 05-Jun-1961 Age / Sex: 61 y.o., male   PCP: Angelique Blonder, DO         Medical Service: Internal Medicine Teaching Service         Attending Physician: : Dr. 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 The pain has been a 7/10, intermittent, both at rest and with exertion. He described it as a sharp pain without anatomical radiation. He did not take any medications to help with pain. The pain was about the same overnight but he has continued to feel progressively more tired today and so he came in to be evaluated.  Patient also felt short-winded at the same time, but that this is chronic.  He denies any lightheadedness, arm pain, diarrhea, dysuria, pleuritic chest pain, presyncopal episodes.   Patient notes he also has a chronic abdominal pain that he has been told is from his known cancer diagnosis. He has endorsed nausea after chemotherapy for which he takes anti-nausea medications. Patient denies recent episodes of vomiting, fever, chills, or anorexia.   Currently patient is asymptomatic and is comfortable.  ED Course: Patient with chest pain and SOB. Labs significant for Troponin 529 ->441. EKG with NSR, PVCs, no acute ST  segment elevation or acute signs of ischemia. CXR without acute cardiopulmonary disease. Cardiology was consulted. Patient was given ASA 345m, placed on heparin infusion, and given oxycodone for pain. Patient was noted to be pain free and hemodynamically stable and was admitted to IMTS for further evaluation  Meds:   Confirmed by KLamar Sprinkles his roommate and who organizes meds* Eliquis 5 mg BID Zofran 8 mg q6hr Compazine 10 mg q6hr  Percocet 10-325 q6hr  Ms. SKarlton Lemonmentioned that patient has not had refills for other medications since his last hospitalization. Usually it is her father who takes patient to appointments and who picks up medications.   Current Meds  Medication Sig   acetaminophen (TYLENOL) 500 MG tablet Take 1,000 mg by mouth every 6 (six) hours as needed for mild pain.   amLODipine (NORVASC) 10 MG tablet Take 10 mg by mouth daily.   apixaban (ELIQUIS) 5 MG TABS tablet Take 1 tablet (5 mg total) by mouth 2 (two) times daily.   atorvastatin (LIPITOR) 80 MG tablet Take 1 tablet (80 mg total) by mouth daily at 6 PM.   chlorthalidone (HYGROTON) 25 MG tablet Take 25 mg by mouth daily.   diclofenac Sodium (VOLTAREN) 1 % GEL Apply 2-4 g topically in the morning, at noon, in the evening, and at bedtime.   docusate sodium (COLACE)  100 MG capsule Take 1 capsule (100 mg total) by mouth daily.   DULoxetine (CYMBALTA) 30 MG capsule Take 30 mg by mouth daily as needed (depression).   feeding supplement (ENSURE ENLIVE / ENSURE PLUS) LIQD Take 237 mLs by mouth 2 (two) times daily between meals.   gabapentin (NEURONTIN) 100 MG capsule Take 100 mg by mouth at bedtime as needed (for pain).    lidocaine-prilocaine (EMLA) cream Apply 1 Application topically as needed.   ondansetron (ZOFRAN) 8 MG tablet TAKE 1 TABLET(8 MG) BY MOUTH EVERY 8 HOURS AS NEEDED FOR NAUSEA OR VOMITING (Patient taking differently: Take 8 mg by mouth every 8 (eight) hours as needed for nausea or vomiting.)    oxyCODONE-acetaminophen (PERCOCET) 10-325 MG tablet Take 1 tablet by mouth every 6 (six) hours as needed for pain.   prochlorperazine (COMPAZINE) 10 MG tablet TAKE 1 TABLET(10 MG) BY MOUTH EVERY 6 HOURS AS NEEDED FOR NAUSEA OR VOMITING (Patient taking differently: Take 10 mg by mouth every 6 (six) hours as needed for vomiting or nausea.)    Past Medical History  Past Surgical History:  Procedure Laterality Date   ABDOMINAL AORTAGRAM  02/20/2019   ABDOMINAL AORTOGRAM W/LOWER EXTREMITY N/A 02/20/2019   Procedure: ABDOMINAL AORTOGRAM W/LOWER EXTREMITY;  Surgeon: Marty Heck, MD;  Location: [facility];  Service: Cardiovascular;  Laterality: N/A;   ACHILLES TENDON SURGERY Bilateral    AMPUTATION Left 05/11/2020   Procedure: LEFT ABOVE KNEE AMPUTATION;  Surgeon: Angelia Mould, MD;  Location: [place];  Service: Vascular;  Laterality: Left;   ENDARTERECTOMY POPLITEAL Left 05/01/2020   Procedure: ENDARTERECTOMY POPLITEAL;  Surgeon: Waynetta Sandy, MD;  Location: [place];  Service: Vascular;  Laterality: Left;   FASCIOTOMY Left 05/01/2020   Procedure: FASCIOTOMY;  Surgeon: Waynetta Sandy, MD;  Location: [place];  Service: Vascular;  Laterality: Left;   FEMORAL-POPLITEAL BYPASS GRAFT Left 02/26/2019   Procedure: BYPASS GRAFT FEMORAL-POPLITEAL ARTERY LEFT LEG USING 54m PROPATEN GRAFT;  Surgeon: BSerafina Mitchell MD;  [place]  Service: Vascular;  Laterality: Left;   INTRAOPERATIVE ARTERIOGRAM Left 01/22/2020   Procedure: INTRA OPERATIVE ARTERIOGRAM with administration of thrombolyics in Left femoral to popliteal bypass.;  Surgeon: CMarty Heck MD;  [place] [facility]  Service: Vascular;  Laterality: Left;   IR IMAGING GUIDED PORT INSERTION  03/07/2022   LEFT HEART CATH AND CORONARY ANGIOGRAPHY N/A 03/15/2017   Procedure: LEFT HEART CATH AND CORONARY ANGIOGRAPHY;  Surgeon: ENelva Bush MD;  [facility];  Service: Cardiovascular;   Laterality: N/A;   LEFT HEART CATH AND CORONARY ANGIOGRAPHY N/A 02/06/2022   Procedure: LEFT HEART CATH AND CORONARY ANGIOGRAPHY;  Surgeon: JMartinique Peter M, MD;  [place] [facility];  Service: Cardiovascular;  Laterality: N/A;   LOWER EXTREMITY INTERVENTION Left 04/29/2020   Procedure: LOWER EXTREMITY INTERVENTION- LYSIS;  Surgeon: CMarty Heck MD;  [facility];  Service: Cardiovascular;  Laterality: Left;   OSTEOTOMY AND ULNAR SHORTENING Right 07/22/2002   PATCH ANGIOPLASTY Left 05/01/2020   Procedure: PATCH ANGIOPLASTY USING HEMASHIELD PLATINUM FINESSE PATCH;  Surgeon: CWaynetta Sandy MD;  [place] [facility]  Service: Vascular;  Laterality: Left;   PERIPHERAL VASCULAR ATHERECTOMY  01/23/2020   Procedure: PERIPHERAL VASCULAR ATHERECTOMY;  Surgeon: CMarty Heck MD;  [facility];  Service: Cardiovascular;;   PERIPHERAL VASCULAR BALLOON ANGIOPLASTY  01/23/2020   Procedure: PERIPHERAL VASCULAR BALLOON ANGIOPLASTY;  Surgeon: CMarty Heck MD;  Location:  [facility];  Service: Cardiovascular;;   PERIPHERAL VASCULAR INTERVENTION Left 04/30/2020   Procedure: PERIPHERAL VASCULAR INTERVENTION;  Surgeon: Cherre Robins, MD;  Location: [facility];  Service: Cardiovascular;  Laterality: Left;   PERIPHERAL VASCULAR THROMBECTOMY N/A 01/23/2020   Procedure: lysis recheck;  Surgeon: Marty Heck, MD;  Location: [facility];  Service: Cardiovascular;  Laterality: N/A;  + [place]    PERIPHERAL VASCULAR THROMBECTOMY N/A 04/30/2020   Procedure: Lysis Recheck;  Surgeon: Cherre Robins, MD;  Location: [facility];  Service: Cardiovascular;  Laterality: N/A;   SHOULDER ARTHROSCOPY WITH BICEPSTENOTOMY Right 03/11/2015   Procedure: SHOULDER ARTHROSCOPY WITH BICEPSTENOTOMY;  Surgeon: Ninetta Lights, MD;  Location: [facility];  Service: Orthopedics;  Laterality: Right;   SHOULDER ARTHROSCOPY WITH  DISTAL CLAVICLE RESECTION Left 11/19/2014   Procedure: SHOULDER ARTHROSCOPY WITH DISTAL CLAVICLE RESECTION;  Surgeon: Kathryne Hitch, MD;  Location: [place];  Service: Orthopedics;  Laterality: Left;   SHOULDER ARTHROSCOPY WITH ROTATOR CUFF REPAIR AND SUBACROMIAL DECOMPRESSION Left 11/19/2014   Procedure: LEFT SHOULDER ARTHROSCOPY, DEBRIDEMENT DISTAL CLAVICLE EXCISION, ACROMIOPLASTY WITH ROTATOR CUFF REPAIR ;  Surgeon: Kathryne Hitch, MD;  Location: [facility];  Service: Orthopedics;  Laterality: Left;   THROMBECTOMY OF BYPASS GRAFT FEMORAL- POPLITEAL ARTERY Left 05/01/2020   Procedure: THROMBECTOMY OF LOWER EXTREMITY;  Surgeon: Waynetta Sandy, MD;  Location: [place];  Service: Vascular;  Laterality: Left;   TRANSFORAMINAL LUMBAR INTERBODY FUSION (TLIF) WITH PEDICLE SCREW FIXATION 1 LEVEL N/A 01/03/2018   Procedure: TRANSFORAMINAL LUMBAR INTERBODY FUSION (TLIF) LUMBAR FIVE-SACRAL ONE;  Surgeon: Melina Schools, MD;  Location: [place];  Service: Orthopedics;  Laterality: N/A;   ULTRASOUND GUIDANCE FOR VASCULAR ACCESS Right 01/22/2020   Procedure: ULTRASOUND GUIDANCE FOR VASCULAR ACCESS Right Common Femoral Artery.;  Surgeon: Marty Heck, MD;  Location: [facility];  Service: Vascular;  Laterality: Right;   VIDEO ASSISTED THORACOSCOPY (VATS)/DECORTICATION Right 11/21/2011   drainage of empyema    Social:  Lives With: two roommates, Mrs. Karlton Lemon and her husband, who are family friends. They moved in ~1y ago.  Ms. Karlton Lemon is who organized his M-S pill boxes and administers medications to patient. Patient is wheelchair bound and has a king size bed at home. He has been having dificulty mobilizing from this bed to his wheelchair and is in need of a hospital bed, especially since getting weaker since the cancer diagnosis.  Occupation: Retired, on disability since his RA diagnosis Support: Family friends. Has a girlfriend. In case of an emergency, call Mrs. Karlton Lemon:  [phone] PCP: Dr. Markus Jarvis, IMTS Substances: Quit smoking 4 months ago, 1ppd for over 20 years. Has not had ETOH in 25 years, former EtOH abuse disorder. No illicit drug use.   Family History: Not obtained  Allergies: Allergies as of 04/09/2022 - Review Complete 04/09/2022  Allergen Reaction Noted   Dilaudid [hydromorphone hcl] Rash 05/08/2020   Ramipril Rash and Other (See Comments) 06/23/2011   Tramadol Itching and Rash 06/23/2011    Review of Systems: A complete ROS was negative except as per HPI.   OBJECTIVE:   Physical Exam: Blood pressure 118/84, pulse (!) 43, temperature 100 F (37.8 C), temperature source Oral, resp. rate 20, SpO2 97 %.  Constitutional: chronically ill-appearing man sitting in bed, in no acute distress HENT: normocephalic atraumatic, mucous membranes moist, partial dentures in place Cardiovascular: regular rate and rhythm, no m/r/g, No JVD. Bilateral 2+ radial pulses, R PD pulse 1+  Pulmonary/Chest: normal work of breathing on room air, lungs clear to auscultation bilaterally. No crackles  Abdominal: soft, non-tender, non-distended. No fluid wave Neurological: alert & oriented x 3 MSK: L AKA, swan neck and boutonierre deformities on bilateral hands, otherwise no other gross deformity. No pitting edema Skin: warm and dry Psych: Normal mood and affect  Labs: CBC    Component Value Date/Time   WBC 5.9 04/09/2022 1421   RBC 4.29 04/09/2022 1421   HGB 10.9 (L) 04/09/2022 1421   HGB 11.2 (L) 03/23/2022 1340   HGB 13.9 03/13/2017 1032   HCT 35.3 (L) 04/09/2022 1421   HCT 41.6 03/13/2017 1032   PLT 159 04/09/2022 1421   PLT 185 03/23/2022 1340   PLT 204 03/13/2017 1032   MCV 82.3 04/09/2022 1421   MCV 84 03/13/2017 1032   MCH 25.4 (L) 04/09/2022 1421   MCHC 30.9 04/09/2022 1421   RDW 16.0 (H) 04/09/2022 1421   RDW 13.7 03/13/2017 1032   LYMPHSABS 0.8 04/09/2022 1421   LYMPHSABS 1.8 03/13/2017 1032   MONOABS 0.1 04/09/2022 1421   EOSABS 0.2  04/09/2022 1421   EOSABS 0.2 03/13/2017 1032   BASOSABS 0.0 04/09/2022 1421   BASOSABS 0.0 03/13/2017 1032     CMP     Component Value Date/Time   NA 133 (L) 04/09/2022 1421   NA 138 01/27/2021 1212   K 3.6 04/09/2022 1421   CL 98 04/09/2022 1421   CO2 24 04/09/2022 1421   GLUCOSE 103 (H) 04/09/2022 1421   BUN 24 (H) 04/09/2022 1421   BUN 17 01/27/2021 1212   CREATININE 0.95 04/09/2022 1421   CREATININE 0.85 03/23/2022 1340   CREATININE 0.80 11/27/2012 0956   CALCIUM 8.7 (L) 04/09/2022 1421   PROT 7.3 04/09/2022 1421   PROT 7.1 12/16/2015 0928   ALBUMIN 3.0 (L) 04/09/2022 1421   ALBUMIN 4.2 12/16/2015 0928   AST 26 04/09/2022 1421   AST 31 03/23/2022 1340   ALT 20 04/09/2022 1421   ALT 14 03/23/2022 1340   ALKPHOS 297 (H) 04/09/2022 1421   BILITOT 0.7 04/09/2022 1421   BILITOT 0.3 03/23/2022 1340   GFRNONAA >60 04/09/2022 1421   GFRNONAA >60 03/23/2022 1340   GFRNONAA >89 11/27/2012 0956   GFRAA >60 01/24/2020 0202   GFRAA >89 11/27/2012 0956    Imaging: DG Chest 2 View  Result Date: 04/09/2022 CLINICAL DATA:  Shortness of breath EXAM: CHEST - 2 VIEW COMPARISON:  03/11/2022 CT, chest radiograph and prior studies FINDINGS: Cardiomegaly and RIGHT IJ Port-A-Cath with tip overlying the SUPERIOR cavoatrial junction again noted. There is no evidence of focal airspace disease, pulmonary edema, suspicious pulmonary nodule/mass, pleural effusion, or pneumothorax. No acute bony abnormalities are identified. Remote bilateral rib fractures again noted. IMPRESSION: Cardiomegaly without evidence of acute cardiopulmonary disease. Electronically Signed   By: Margarette Canada M.D.   On: 04/09/2022 16:16      EKG: personally reviewed my interpretation is NSR, PVC without new ST segment elevation or depression. Prior EKG 02/2022. Telemetry: NSR, frequent PVCs, non sustained ventricular tachycardia  ASSESSMENT & PLAN:   Assessment & Plan by Problem: Principal Problem:   Chest pain   Mr.  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 Patient has not refilled Plavix since last hospitalization. Cardiology was consulted, appreciate recommendations. This is thought to be in the setting of supply-demand 2/2 n/v s/p chemo. Cardiology does not recommend repeat heart catherization at this time; will continue to manage medically. Of note, patient's BP has been 102-120/70-80 since admission. He has known decreased moderate RV dysfunction. Will hold off on antihypertensives to preserve preload at this time. --CTM and reassess need to antihypertensive medications, including Imdur --Cardiology consulted, appreciate recommendations: --=No cardiac cath at this time --=Re-start Lipitor 80 mg (02/2022 LDL 31) --=Restart and ensure continuation of Plavix therapy for 1 year (01/2023) --=D/c'd heparin drip started in the ED and resumed home Apixaban --=Assess and monitor BP and evaluate need to start patient on Indur 79m, amlodipine 168m Chronic RV dysfunction Patient noted to have RV pressure overload as evidenced by flattened interventricular septum during systole, McConnell's sign,  severe RV systolic dysfunction, and RV dilation. Normal PA pressure in 01/2022. Patient with known history of COPD and multiple PE likely the culprit of chronic RV dysfunction. No signs of volume overload on exam. Systolic pressures maintaining  currently. -CTM vital signs and fluid status -Continue home Eliquis 48m67mID  Hx of PE/DVT Chronic LLL and RUL PEs  noted in prior CTA chest studies 06/2020, 01/2022, 02/2022 [facility]. Patient has since been on Eliquis 48mg31mD per family member.  No concerns for acute PE during this hospitalization -Continue Eliquis 48mg 64m  Metastatic adenocarcinoma Followed by Dr. SheriLearta Coddingely esophageal primary. Known masses in liver, RP lymph nodes, and GE junction mass.Thus far patient with AFP 2.4, CA 19.9 88, CEA 287, CK7 positive, which points to pancreatic, lung, or upper GI primary. HER2 3+. PET scan on 03/08/2022 with hypermetabolic distal esophagus mass, centrally necrotic hepatic mass, periportal nodes, and adrenal glands. No hypermetabolic signal noted at exophytic GE junction or pancreatic head. Chemotherapy with FOLFOX/trastuzumab/pembrolizumab initiated on 04/08/2022, LV EF on 01/31/22 60-65% without LV dysfunction. -Repeat TTE after 2 months on chemotherapy -Monitor for signs of chemo toxicity  Anemia Hg 10.9 MCV 82.3. Last iron studies on 01/2022: ferritin 151, iron 13, percent saturation 5%, likely combined Iron deficiency anemia and anemia chronic disease. Patient was not started on iron supplementation at the time in the setting of recent infection.  -Start IV iron supplementation with Ferlicit  HTN HLD PAD s/p AKA in 2021 Wheelchair bound Patient has not been taking home antihypertensives or cholesterol medications. Last lipid panel on 02/2022 with LDL 31. Normotensive without medication since admission.  -Restarted Lipitor 80 mg -CTM vital signs and reassess need for reinitiation of anitihypertensive therapy  Hx nondisplaced anterior rib fractures Midsternal fracture Patient found to have 2-7 bilateral anterior rib fractures after cardiac arrest 01/2022, requiring CPR. Previously treated with lidocaine patch and oxycodone,  which patient still takes at home.  -Lidocaine patch -Resumed oxycodone 10mg 22m  RA Bilateral hands. RF+. Patient previously on chronic prednisone therapy discontinued on 01/2022 per chart review. Of note, patient now on chemotherapy with pembrolizumab, increasing the likelihood of RA flares.  -CTM patient for signs of RA flare -Consider restarting patient on RA therapy at discharge  COPD Emphysema on CT, no recent PFTs. Patient is on room air at baseline and no home medications or inhalers -CTM  CVA Chronic, likely in the setting of hypercoagulable state 2/2 malignancy and smoking hx. -Secondary prevention with medical management   Diet: Heart Healthy VTE:  Eliquis Code: Full  Prior to Admission Living Arrangement:  Home living alone with roommates ( family friends Anticipated Discharge Location: Home Barriers to Discharge: Clinical improvement  Dispo: Admit patient to Observation with expected length of stay less than 2 midnights.  Signed:   Romana Juniper, MD Internal Medicine Resident PGY-1 04/09/2022, 10:27 PM

## 2022-04-09 NOTE — Hospital Course (Addendum)
Started having L chest pain yesterday around noon. The pain was intermittent. He felt short-winded at the same time. He felt more tired today so he came in to be evaluated. Pain was worsened by movement and slightly improved by rest. He denies any lightheadedness, arm pain, diarrhea, dysuria   Has meds in a pillbox that roommate helps with. His roommate has been living with him for 1 year. He has a girlfriend  Quit smoking 4 months ago. Has not had ETOH in 25 years. No illicit drug use.   Kelly  Medications: Confirmed by Lamar Sprinkles, his roommate and who organizes meds* Eliquis 5 mg BID Zofran 8 mg q6hr Compazine 10 mg q6hr  Percocet 10-325 q6hr  [phone], Mrs. Shelton  NSTEMI  CP, SOB, n/v after chemo -- metastatic adenocarcinoma   Pain free for right now. -- hemodynamically stable Cardiology   ASA and heparin gtt  Vomiting s/p chemo yesterday.   Cardiac arrest a couple of months ago.   ED course: 529 ->441. Troponin Given ASA 372m, placed on heparin infusion, and given oxycodone for pain. Patient was noted to be pain free and hemodynamically stable and was admitted to IMTS for further evaluation  NSTEMI Hx of CABG s/p stent in 2020Recent hx of MI 01/25/22. LHC and coronary angio 02/06/22 with single vessel occlusive CAD and new oclussion of proximal nondominant RCA, new since 2018.    Intraventricular septum flattened, consistent with RV pressure overload.    Metastatic adenocarcinoma Followed by Dr. SLearta Codding Likely esophageal primary. Known masses in liver, RP lymph nodes, and GE junction mass.Thus far patient with AFP 2.4, CA 19.9 88, CEA 287, CK7 positive, which points to pancreatic, lung, or upper GI primary. HER2 3+. PET scan on 03/08/2022 with hypermetabolic distal esophagus mass, centrally necrotic hepatic mass, periportal nodes, and adrenal glands. No hypermetabolic signal noted at exophytic GE junction or pancreatic head. Chemotherapy with  FOLFOX/trastuzumab/pembrolizumab initiated on 04/08/2022, LV EF on 01/31/22 60-65% without LV dysfunction -Repeat TTE after 2 months on chemotherapy -Monitor for signs of chemo toxicity -  PE DVT Compliance? On Apixaban therapy  HTN HLD PAD s/p AKA in 2021 Wheelchair bound  Bilateral pleural effusions S/p thoracenthesis negative for malignancy   COPD  CVA  10/16 Takes apixaban at home. Only receiving chemotherapy, no radiation. Chest pain is 70% better today. Discussed workup for heart attack versus other cause of elevated troponin. Discussed post-hospital rehabilitation. [facility] not [facility] to [facility] Gets around at home with wheelchair, without difficulties.  10/17 Reports less coughing. He is ready to go home. Reports nausea/vomiting has improved. Discussed that he can take his home pain meds. Discussed preparing his discharge paperwork. Discussed adjustments to his BP meds and starting him on metoprolol. Discussed close follow-up with IM clinic. He would like TOC pharmacy.  Mild tenderness to palpation in RLQ.

## 2022-04-09 NOTE — Consult Note (Signed)
Cardiology Consultation   Patient ID: GWEN EDLER MRN: [id number]; DOB: 06-16-1961  Admit date: 04/09/2022 Date of Consult: 04/09/2022  PCP:  Angelique Blonder, [place] Prov[facility] Cardiologist:  Candee Furbish, MD        Patient Profile:   [phone] with metastatic adenocarcinoma (likely esophagus primary) on chemo, hx of VF cardiac arrest and NSTEMI managed medically on 01/2022 (CTO of non dominant RCA), moderately reduced RV function and severe RV dilation, PAD s/p L AKA, COPD, hx of DVT / PE on apixaban presents with chest pain.  History of Present Illness:   47M with metastatic adenocarcinoma (likely esophagus primary) on chemo, hx of VF cardiac arrest and NSTEMI managed medically on 01/2022 (CTO of non dominant RCA), PAD s/p L AKA, COPD, hx of DVT / PE on apixaban presents with chest pain. The chest pain has been occurring sporadically over the past several days. It is left sided, pressure sensation, lasting minutes, 7/10 in severity, not associated with exertion. He is sedentary due to AKA but does do some upper body weights and that does not cause the pain. No chest tenderness. He recently started chemo and did have nausea / vomiting yesterday. No dyspnea, bleeding, syncope, falls, lower extremity edema. Occasional palpitations. Compliant on medications. EKG NSR with frequent PVCs. Troponin 529 - 441. BNP pending. CXR normal. Last TTE on 01/2021 showed normal EF, moderately reduced RV function and severe RV dilation.   Past Medical History:  Diagnosis Date   Abnormal CT scan, gastrointestinal tract    Acute respiratory failure ([place]) 01/31/2022   Acute respiratory failure with hypoxemia (HCC) 02/11/2022   Anemia    Anemia 08/06/2012   Aortic valve mass 12/30/2015   Bilateral shoulder pain 08/20/2012   Bite wound of left hand 09/27/2021   Cardiogenic shock ([place]) 01/31/2022   Community acquired pneumonia 11/07/2011   Critical limb ischemia with history of revascularization of same  extremity ([place]) 04/28/2020   DDD (degenerative disc disease), lumbosacral     and grade 2 slip   DVT (deep venous thrombosis) ([place]) 01/31/2022   GERD (gastroesophageal reflux disease)    H/O: stroke 01/31/2022   Heart murmur    History of anemia    no current med.   History of COVID-19 05/10/2019   History of critical lower limb ischemia 02/19/2019   Hyperglycemia 01/31/2022   Hypertension    states is borderline on medication; has been on med. x 5-6 yr.   Hypokalemia 05/30/2018   Impingement syndrome of shoulder region 10/2014   left   Insomnia 06/01/2015   Ischemic ulcer of toe of left foot (HCC)    Lactic acidosis 01/31/2022   Left shoulder pain 12/16/2015   Liver mass    Low back pain without sciatica 10/29/2009   Lupus ([place])    Multiple fractures of ribs, bilateral, due to CPR trauma 01/31/2022   Onychomycosis 01/28/2021   Paronychia of great toe 01/28/2021   Peripheral vascular disease ([place])    Pneumonia    Pressure injury of skin 01/31/2022   Pulmonary contusion 01/31/2022   RA (rheumatoid arthritis) ([place])    Rotator cuff tear 11/04/2014   STEMI (ST elevation myocardial infarction) ([place]) 01/31/2022   Sternal fracture 01/31/2022   Transaminitis 12/16/2015    Past Surgical History:  Procedure Laterality Date   ABDOMINAL AORTAGRAM  02/20/2019   ABDOMINAL AORTOGRAM W/LOWER EXTREMITY N/A 02/20/2019   Procedure: ABDOMINAL AORTOGRAM W/LOWER EXTREMITY;  Surgeon: Marty Heck, MD;  Location: [facility]  INVASIVE CV LAB;  Service: Cardiovascular;  Laterality: N/A;   ACHILLES TENDON SURGERY Bilateral    AMPUTATION Left 05/11/2020   Procedure: LEFT ABOVE KNEE AMPUTATION;  Surgeon: Angelia Mould, MD;  Location: [place];  Service: Vascular;  Laterality: Left;   ENDARTERECTOMY POPLITEAL Left 05/01/2020   Procedure: ENDARTERECTOMY POPLITEAL;  Surgeon: Waynetta Sandy, MD;  Location: [place];  Service: Vascular;  Laterality: Left;   FASCIOTOMY Left 05/01/2020   Procedure: FASCIOTOMY;  Surgeon:  Waynetta Sandy, MD;  Location: [place];  Service: Vascular;  Laterality: Left;   FEMORAL-POPLITEAL BYPASS GRAFT Left 02/26/2019   Procedure: BYPASS GRAFT FEMORAL-POPLITEAL ARTERY LEFT LEG USING 71m PROPATEN GRAFT;  Surgeon: BSerafina Mitchell MD;  [place]  Service: Vascular;  Laterality: Left;   INTRAOPERATIVE ARTERIOGRAM Left 01/22/2020   Procedure: INTRA OPERATIVE ARTERIOGRAM with administration of thrombolyics in Left femoral to popliteal bypass.;  Surgeon: CMarty Heck MD;  [place]  Service: Vascular;  Laterality: Left;   IR IMAGING GUIDED PORT INSERTION  03/07/2022   LEFT HEART CATH AND CORONARY ANGIOGRAPHY N/A 03/15/2017   Procedure: LEFT HEART CATH AND CORONARY ANGIOGRAPHY;  Surgeon: ENelva Bush MD;  [facility];  Service: Cardiovascular;  Laterality: N/A;   LEFT HEART CATH AND CORONARY ANGIOGRAPHY N/A 02/06/2022   Procedure: LEFT HEART CATH AND CORONARY ANGIOGRAPHY;  Surgeon: JMartinique Peter M, MD;  [facility];  Service: Cardiovascular;  Laterality: N/A;   LOWER EXTREMITY INTERVENTION Left 04/29/2020   Procedure: LOWER EXTREMITY INTERVENTION- LYSIS;  Surgeon: CMarty Heck MD;  [facility];  Service: Cardiovascular;  Laterality: Left;   OSTEOTOMY AND ULNAR SHORTENING Right 07/22/2002   PATCH ANGIOPLASTY Left 05/01/2020   Procedure: PATCH ANGIOPLASTY USING HEMASHIELD PLATINUM FINESSE PATCH;  Surgeon: CWaynetta Sandy MD;  [place]  Service: Vascular;  Laterality: Left;   PERIPHERAL VASCULAR ATHERECTOMY  01/23/2020   Procedure: PERIPHERAL VASCULAR ATHERECTOMY;  Surgeon: CMarty Heck MD;  [facility];  Service: Cardiovascular;;   PERIPHERAL VASCULAR BALLOON ANGIOPLASTY  01/23/2020   Procedure: PERIPHERAL VASCULAR BALLOON ANGIOPLASTY;  Surgeon: CMarty Heck MD;  [facility];  Service: Cardiovascular;;   PERIPHERAL VASCULAR INTERVENTION  Left 04/30/2020   Procedure: PERIPHERAL VASCULAR INTERVENTION;  Surgeon: HCherre Robins MD;  [facility];  Service: Cardiovascular;  Laterality: Left;   PERIPHERAL VASCULAR THROMBECTOMY N/A 01/23/2020   Procedure: lysis recheck;  Surgeon: CMarty Heck MD;  [facility];  Service: Cardiovascular;  Laterality: N/A;  + [place]    PERIPHERAL VASCULAR THROMBECTOMY N/A 04/30/2020   Procedure: Lysis Recheck;  Surgeon: HCherre Robins MD;  [place];  Service: Cardiovascular;  Laterality: N/A;   SHOULDER ARTHROSCOPY WITH BICEPSTENOTOMY Right 03/11/2015   Procedure: SHOULDER ARTHROSCOPY WITH BICEPSTENOTOMY;  Surgeon: DNinetta Lights MD;  [place]: [place]  Service: Orthopedics;  Laterality: Right;   SHOULDER ARTHROSCOPY WITH DISTAL CLAVICLE RESECTION Left 11/19/2014   Procedure: SHOULDER ARTHROSCOPY WITH DISTAL CLAVICLE RESECTION;  Surgeon: DKathryne Hitch MD;  [place] [facility]  Service: Orthopedics;  Laterality: Left;   SHOULDER ARTHROSCOPY WITH ROTATOR CUFF REPAIR AND SUBACROMIAL DECOMPRESSION Left 11/19/2014   Procedure: LEFT SHOULDER ARTHROSCOPY, DEBRIDEMENT DISTAL CLAVICLE EXCISION, ACROMIOPLASTY WITH ROTATOR CUFF REPAIR ;  Surgeon: DKathryne Hitch MD;  Location: [place]  Service: Orthopedics;  Laterality: Left;   THROMBECTOMY OF BYPASS GRAFT  FEMORAL- POPLITEAL ARTERY Left 05/01/2020   Procedure: THROMBECTOMY OF LOWER EXTREMITY;  Surgeon: Waynetta Sandy, MD;  Location: [place];  Service: Vascular;  Laterality: Left;   TRANSFORAMINAL LUMBAR INTERBODY FUSION (TLIF) WITH PEDICLE SCREW FIXATION 1 LEVEL N/A 01/03/2018   Procedure: TRANSFORAMINAL LUMBAR INTERBODY FUSION (TLIF) LUMBAR FIVE-SACRAL ONE;  Surgeon: Melina Schools, MD;  Location: [place];  Service: Orthopedics;  Laterality: N/A;   ULTRASOUND GUIDANCE FOR VASCULAR ACCESS Right 01/22/2020   Procedure: ULTRASOUND GUIDANCE FOR  VASCULAR ACCESS Right Common Femoral Artery.;  Surgeon: Marty Heck, MD;  Location: [place];  Service: Vascular;  Laterality: Right;   VIDEO ASSISTED THORACOSCOPY (VATS)/DECORTICATION Right 11/21/2011   drainage of empyema     Home Medications:  Prior to Admission medications   Medication Sig Start Date End Date Taking? Authorizing Provider  acetaminophen (TYLENOL) 500 MG tablet Take 1,000 mg by mouth every 6 (six) hours as needed for mild pain.   Yes [provider]  amLODipine (NORVASC) 10 MG tablet Take 10 mg by mouth daily.   Yes [provider]  apixaban (ELIQUIS) 5 MG TABS tablet Take 1 tablet (5 mg total) by mouth 2 (two) times daily. 02/28/22  Yes Mapp, Tavien, MD  atorvastatin (LIPITOR) 80 MG tablet Take 1 tablet (80 mg total) by mouth daily at 6 PM. 02/20/22  Yes Serita Butcher, MD  chlorthalidone (HYGROTON) 25 MG tablet Take 25 mg by mouth daily.   Yes [provider]  diclofenac Sodium (VOLTAREN) 1 % GEL Apply 2-4 g topically in the morning, at noon, in the evening, and at bedtime. 12/26/21  Yes [provider]  docusate sodium (COLACE) 100 MG capsule Take 1 capsule (100 mg total) by mouth daily. 01/27/21  Yes Lyndal Pulley, MD  DULoxetine (CYMBALTA) 30 MG capsule Take 30 mg by mouth daily as needed (depression). 12/26/21  Yes [provider]  feeding supplement (ENSURE ENLIVE / ENSURE PLUS) LIQD Take 237 mLs by mouth 2 (two) times daily between meals. 03/18/22  Yes Dahal, Marlowe Aschoff, MD  gabapentin (NEURONTIN) 100 MG capsule Take 100 mg by mouth at bedtime as needed (for pain).  12/31/19  Yes [provider]  lidocaine-prilocaine (EMLA) cream Apply 1 Application topically as needed. 03/03/22  Yes Owens Shark, NP  ondansetron (ZOFRAN) 8 MG tablet TAKE 1 TABLET(8 MG) BY MOUTH EVERY 8 HOURS AS NEEDED FOR NAUSEA OR VOMITING Patient taking differently: Take 8 mg by mouth every 8 (eight) hours as needed for nausea or vomiting. 04/05/22  Yes  Ladell Pier, MD  oxyCODONE-acetaminophen (PERCOCET) 10-325 MG tablet Take 1 tablet by mouth every 6 (six) hours as needed for pain. 03/30/22 04/29/22 Yes Atway, Rayann N, DO  prochlorperazine (COMPAZINE) 10 MG tablet TAKE 1 TABLET(10 MG) BY MOUTH EVERY 6 HOURS AS NEEDED FOR NAUSEA OR VOMITING Patient taking differently: Take 10 mg by mouth every 6 (six) hours as needed for vomiting or nausea. 04/05/22  Yes Ladell Pier, MD  clopidogrel (PLAVIX) 75 MG tablet Take 1 tablet (75 mg total) by mouth daily. Patient not taking: Reported on 03/12/2022 07/18/21 07/18/22  Delene Ruffini, MD    Inpatient Medications: Scheduled Meds:  Continuous Infusions:  heparin 800 Units/hr (04/09/22 1806)   PRN Meds:   Allergies:    Allergies  Allergen Reactions   Dilaudid [Hydromorphone Hcl] Rash   Ramipril Rash and Other (See Comments)    Per patient on R arm and leg only; no angioedema   Tramadol Itching and  Rash    Social History:   Social History   Socioeconomic History   Marital status: Single    Spouse name: Not on file   Number of children: 0   Years of education: Not on file   Highest education level: Not on file  Occupational History   Occupation: Disabled    Comment: 2/2 RA  Tobacco Use   Smoking status: Former    Packs/day: 0.25    Years: 20.00    Total pack years: 5.00    Types: Cigarettes    Quit date: 02/19/2019    Years since quitting: 3.1   Smokeless tobacco: Never   Tobacco comments:    Patient stated he smokes a couple day and is attempting to quit smoking  Vaping Use   Vaping Use: Never used  Substance and Sexual Activity   Alcohol use: No    Alcohol/week: 0.0 standard drinks of alcohol    Comment: sober 1998   Drug use: No   Sexual activity: Not on file  Other Topics Concern   Not on file  Social History Narrative   On disability since 1992, used to work with city of [place]. Quit drinking in 1990.      Current Social History 11/05/2019         Patient lives by himself most of the time. Sometimes girlfriend's son stays with him in a one level home. There are 3 steps with handrail up to the entrance the patient uses.       Patient's method of transportation is personal truck. This was recently vandalized and patient doesn't have funds to repair at this time.      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, BSN, RN-BC    Social Determinants of Health   Financial Resource Strain: Low Risk  (04/04/2022)   Overall Financial Resource Strain (CARDIA)    Difficulty of Paying Living Expenses: Not hard at all  Food Insecurity: No Food Insecurity (01/27/2022)   Hunger Vital Sign    Worried About Running Out of Food in the Last Year: Never true    Clements in the Last Year: Never true  Transportation Needs: No Transportation Needs (03/01/2022)   PRAPARE - Hydrologist (Medical): No    Lack of Transportation (Non-Medical): No  Physical Activity: Not on file  Stress: Not on file  Social Connections: Not on file  Intimate Partner Violence: Not At Risk (04/04/2022)   Humiliation, Afraid, Rape, and Kick questionnaire    Fear of Current or Ex-Partner: No    Emotionally Abused: No    Physically Abused: No    Sexually Abused: No    Family History:   Family History  Problem Relation Age of Onset   Heart attack Father 16   Alcohol abuse Father    Aneurysm Mother    Stroke Neg Hx    Cancer Neg  Hx      ROS:  Please see the history of present illness.  All other ROS reviewed and  negative.     Physical Exam/Data:   Vitals:   04/09/22 1416  BP: (!) 146/69  Pulse: 98  Resp: 16  Temp: 98.9 F (37.2 C)   No intake or output data in the 24 hours ending 04/09/22 1809    04/06/2022    8:00 AM 03/23/2022    1:33 PM 03/18/2022    5:00 AM  Last 3 Weights  Weight (lbs) 136 lb 9.6 oz 145 lb 6.4 oz 142 lb  Weight (kg) 61.961 kg 65.953 kg 64.411 kg     There is no height or weight on file to calculate BMI.  General:  No acute distress HEENT: normal Neck: no JVD Vascular: Distal pulses 2+ bilaterally Cardiac:  normal S1, S2; RRR; no murmur  Lungs:  clear to auscultation bilaterally, no wheezing, rhonchi or rales  Abd: soft, nontender, no hepatomegaly  Ext: no edema Musculoskeletal:  L AKA Skin: warm and dry  Neuro:  Alert and oriented Psych:  Normal affect   EKG:  The EKG was personally reviewed and demonstrates:  NSR with PVCs Telemetry:  Telemetry was personally reviewed and demonstrates: frequent PVCs   Laboratory Data:  High Sensitivity Troponin:   Recent Labs  Lab 03/11/22 1930 03/11/22 2113 04/09/22 1421  TROPONINIHS 63* 76* 529*     Chemistry Recent Labs  Lab 04/09/22 1421  NA 133*  K 3.6  CL 98  CO2 24  GLUCOSE 103*  BUN 24*  CREATININE 0.95  CALCIUM 8.7*  GFRNONAA >60  ANIONGAP 11    Recent Labs  Lab 04/09/22 1421  PROT 7.3  ALBUMIN 3.0*  AST 26  ALT 20  ALKPHOS 297*  BILITOT 0.7   Lipids No results for input(s): "CHOL", "TRIG", "HDL", "LABVLDL", "LDLCALC", "CHOLHDL" in the last 168 hours.  Hematology Recent Labs  Lab 04/09/22 1421  WBC 5.9  RBC 4.29  HGB 10.9*  HCT 35.3*  MCV 82.3  MCH 25.4*  MCHC 30.9  RDW 16.0*  PLT 159   Thyroid No results for input(s): "TSH", "FREET4" in the last 168 hours.  BNPNo results for input(s): "BNP", "PROBNP" in the last 168 hours.  DDimer No results for input(s): "DDIMER" in the last 168 hours.   Radiology/Studies:  DG Chest 2 View  Result Date: 04/09/2022 CLINICAL DATA:   Shortness of breath EXAM: CHEST - 2 VIEW COMPARISON:  03/11/2022 CT, chest radiograph and prior studies FINDINGS: Cardiomegaly and RIGHT IJ Port-A-Cath with tip overlying the SUPERIOR cavoatrial junction again noted. There is no evidence of focal airspace disease, pulmonary edema, suspicious pulmonary nodule/mass, pleural effusion, or pneumothorax. No acute bony abnormalities are identified. Remote bilateral rib fractures again noted. IMPRESSION: Cardiomegaly without evidence of acute cardiopulmonary disease. Electronically Signed   By: Margarette Canada M.D.   On: 04/09/2022 16:16     Assessment and Plan:  41M with metastatic adenocarcinoma (likely esophagus primary) on chemo, hx of VF cardiac arrest and NSTEMI managed medically on 01/2022 (CTO of non dominant RCA), moderately reduced RV function and severe RV dilation, PAD s/p L AKA, COPD, hx of DVT / PE on apixaban presents with chest pain. Suspect supply-demand in setting of recent nausea / vomiting and recent chemo initiation.   CAD, troponin elevation - continue plavix for one year after medically managed NSTEMI in 01/2022 - continue atorvastatin 80 mg, last LDL 31 at goal - recommend starting  imdur 30 mg - continue amlodipine 10 mg - d/c heparin gtt and resume home apixaban - likely will not need repeat cath  Hx of VF arrest - etiology thought to be ischemic - no mention of ICD in prior notes but likely not placed due to overall prognosis  RV dysfunction - likely group 4 in setting of thromboembolic disease - CTA chest on 02/27/22 noted segmental and subsegmental PE - volume status normal - resume home apixaban  Hx of PE / DVT - resume home apixaban   Risk Assessment/Risk Scores:     TIMI Risk Score for Unstable Angina or Non-ST Elevation MI:   The patient's TIMI risk score is  , which indicates a  % risk of all cause mortality, new or recurrent myocardial infarction or need for urgent revascularization in the next 14 days.           For questions or updates, please contact [facility] Please consult [email or web address] for contact info under    Signed, Georgette Shell  04/09/2022 6:09 PM

## 2022-04-09 NOTE — ED Provider Triage Note (Signed)
Emergency Medicine Provider Triage Evaluation Note  [name] [name] , a 61 y.o. male  was evaluated in triage.  Pt complains of overall not feeling well, states he is nauseated, and his breathing is slightly worse than normal.  He does have history of stage IV esophageal cancer currently on chemo.  Review of Systems  Positive: As above Negative: As above  Physical Exam  BP (!) 146/69 (BP Location: Right Arm)   Pulse 98   Temp 98.9 F (37.2 C)   Resp 16  Gen:   Awake, no distress   Resp:  Normal effort  MSK:   Moves extremities without difficulty  Other:    Medical Decision Making  Medically screening exam initiated at 2:23 PM.  Appropriate orders placed.  [name] [name] was informed that the remainder of the evaluation will be completed by another provider, this initial triage assessment does not replace that evaluation, and the importance of remaining in the ED until their evaluation is complete.     Evlyn Courier, PA-C 04/09/22 1424

## 2022-04-09 NOTE — ED Triage Notes (Signed)
Patient here with request for a prescription for pain medication. Patient states he is undergoing chemotherapy and was prescribed a pain medication by his oncologist with acetaminophen but then was told he should not take medications that include acetaminophen so he came here for a new medication. Patient states his oncologist is unavailable today.

## 2022-04-09 NOTE — Progress Notes (Signed)
ANTICOAGULATION CONSULT NOTE - Initial Consult  Pharmacy Consult for heparin Indication: chest pain/ACS  Allergies  Allergen Reactions   Dilaudid [Hydromorphone Hcl] Rash   Ramipril Rash and Other (See Comments)    Per patient on R arm and leg only; no angioedema   Tramadol Itching and Rash    Patient Measurements:   Heparin Dosing Weight: TBW  Vital Signs: Temp: 98.9 F (37.2 C) (10/15 1416) BP: 146/69 (10/15 1416) Pulse Rate: 98 (10/15 1416)  Labs: Recent Labs    04/09/22 1421  HGB 10.9*  HCT 35.3*  PLT 159  CREATININE 0.95  TROPONINIHS 529*    Estimated Creatinine Clearance: 71.6 mL/min (by C-G formula based on SCr of 0.95 mg/dL).   Medical History: Past Medical History:  Diagnosis Date   Abnormal CT scan, gastrointestinal tract    Acute respiratory failure ([place]) 01/31/2022   Acute respiratory failure with hypoxemia (HCC) 02/11/2022   Anemia    Anemia 08/06/2012   Aortic valve mass 12/30/2015   Bilateral shoulder pain 08/20/2012   Bite wound of left hand 09/27/2021   Cardiogenic shock ([place]) 01/31/2022   Community acquired pneumonia 11/07/2011   Critical limb ischemia with history of revascularization of same extremity ([place]) 04/28/2020   DDD (degenerative disc disease), lumbosacral     and grade 2 slip   DVT (deep venous thrombosis) ([place]) 01/31/2022   GERD (gastroesophageal reflux disease)    H/O: stroke 01/31/2022   Heart murmur    History of anemia    no current med.   History of COVID-19 05/10/2019   History of critical lower limb ischemia 02/19/2019   Hyperglycemia 01/31/2022   Hypertension    states is borderline on medication; has been on med. x 5-6 yr.   Hypokalemia 05/30/2018   Impingement syndrome of shoulder region 10/2014   left   Insomnia 06/01/2015   Ischemic ulcer of toe of left foot (HCC)    Lactic acidosis 01/31/2022   Left shoulder pain 12/16/2015   Liver mass    Low back pain without sciatica 10/29/2009   Lupus ([place])    Multiple fractures of ribs,  bilateral, due to CPR trauma 01/31/2022   Onychomycosis 01/28/2021   Paronychia of great toe 01/28/2021   Peripheral vascular disease ([place])    Pneumonia    Pressure injury of skin 01/31/2022   Pulmonary contusion 01/31/2022   RA (rheumatoid arthritis) ([place])    Rotator cuff tear 11/04/2014   STEMI (ST elevation myocardial infarction) ([place]) 01/31/2022   Sternal fracture 01/31/2022   Transaminitis 12/16/2015   Assessment: 62 YOM presenting with CP and SOB, hx CAD, hx PE on Eliquis PTA  Goal of Therapy:  Heparin level 0.3-0.7 units/ml aPTT 66-102 seconds Monitor platelets by anticoagulation protocol: Yes   Plan:  Heparin gtt at 800 units/hr, no bolus F/u 6 hour aPTT/HL F/u cards eval and recs  Bertis Ruddy, PharmD Clinical Pharmacist ED Pharmacist Phone # [phone] 04/09/2022 5:52 PM

## 2022-04-10 ENCOUNTER — Other Ambulatory Visit: Payer: Self-pay

## 2022-04-10 DIAGNOSIS — I2489 Other forms of acute ischemic heart disease: Secondary | ICD-10-CM

## 2022-04-10 DIAGNOSIS — I25118 Atherosclerotic heart disease of native coronary artery with other forms of angina pectoris: Secondary | ICD-10-CM | POA: Diagnosis not present

## 2022-04-10 DIAGNOSIS — Z8674 Personal history of sudden cardiac arrest: Secondary | ICD-10-CM | POA: Diagnosis not present

## 2022-04-10 DIAGNOSIS — I214 Non-ST elevation (NSTEMI) myocardial infarction: Secondary | ICD-10-CM

## 2022-04-10 DIAGNOSIS — Z86711 Personal history of pulmonary embolism: Secondary | ICD-10-CM | POA: Diagnosis not present

## 2022-04-10 DIAGNOSIS — R079 Chest pain, unspecified: Secondary | ICD-10-CM

## 2022-04-10 LAB — BASIC METABOLIC PANEL
Anion gap: 9 (ref 5–15)
BUN: 19 mg/dL (ref 8–23)
CO2: 22 mmol/L (ref 22–32)
Calcium: 8.3 mg/dL — ABNORMAL LOW (ref 8.9–10.3)
Chloride: 106 mmol/L (ref 98–111)
Creatinine, Ser: 0.84 mg/dL (ref 0.61–1.24)
GFR, Estimated: >60 mL/min (ref >60)
Glucose, Bld: 90 mg/dL (ref 70–99)
Potassium: 3.3 mmol/L — ABNORMAL LOW (ref 3.5–5.1)
Sodium: 137 mmol/L (ref 135–145)

## 2022-04-10 LAB — MAGNESIUM: Magnesium: 1.8 mg/dL (ref 1.7–2.4)

## 2022-04-10 MED ORDER — PROCHLORPERAZINE EDISYLATE 10 MG/2ML IJ SOLN
10.0000 mg | Freq: Once | INTRAMUSCULAR | Status: AC
  Administered 2022-04-10: 10 mg via INTRAVENOUS
  Filled 2022-04-10: qty 2

## 2022-04-10 MED ORDER — SODIUM CHLORIDE 0.9 % IV SOLN
250.0000 mg | Freq: Once | INTRAVENOUS | Status: AC
  Administered 2022-04-10: 250 mg via INTRAVENOUS
  Filled 2022-04-10: qty 20

## 2022-04-10 MED ORDER — SODIUM CHLORIDE 0.9 % IV SOLN
8.0000 mg | Freq: Four times a day (QID) | INTRAVENOUS | Status: DC | PRN
  Filled 2022-04-10: qty 4

## 2022-04-10 MED ORDER — ISOSORBIDE MONONITRATE ER 30 MG PO TB24
30.0000 mg | ORAL_TABLET | Freq: Every day | ORAL | Status: DC
  Administered 2022-04-10 – 2022-04-11 (×2): 30 mg via ORAL
  Filled 2022-04-10 (×2): qty 1

## 2022-04-10 MED ORDER — POTASSIUM CHLORIDE CRYS ER 20 MEQ PO TBCR
40.0000 meq | EXTENDED_RELEASE_TABLET | Freq: Four times a day (QID) | ORAL | Status: AC
  Administered 2022-04-10 (×2): 40 meq via ORAL
  Filled 2022-04-10 (×2): qty 2

## 2022-04-10 MED ORDER — MAGNESIUM SULFATE 2 GM/50ML IV SOLN
2.0000 g | Freq: Once | INTRAVENOUS | Status: AC
  Administered 2022-04-10: 2 g via INTRAVENOUS
  Filled 2022-04-10: qty 50

## 2022-04-10 MED ORDER — LIDOCAINE 5 % EX PTCH
1.0000 | MEDICATED_PATCH | CUTANEOUS | Status: DC
  Administered 2022-04-10 – 2022-04-11 (×2): 1 via TRANSDERMAL
  Filled 2022-04-10 (×2): qty 1

## 2022-04-10 MED ORDER — DULOXETINE HCL 30 MG PO CPEP
30.0000 mg | ORAL_CAPSULE | Freq: Every day | ORAL | Status: DC
  Administered 2022-04-10 – 2022-04-11 (×2): 30 mg via ORAL
  Filled 2022-04-10 (×2): qty 1

## 2022-04-10 MED ORDER — ONDANSETRON HCL 4 MG PO TABS
8.0000 mg | ORAL_TABLET | Freq: Four times a day (QID) | ORAL | Status: DC | PRN
  Administered 2022-04-10 – 2022-04-11 (×3): 8 mg via ORAL
  Filled 2022-04-10 (×3): qty 2

## 2022-04-10 MED ORDER — METOPROLOL TARTRATE 25 MG PO TABS
12.5000 mg | ORAL_TABLET | Freq: Two times a day (BID) | ORAL | Status: DC
  Administered 2022-04-10 – 2022-04-11 (×2): 12.5 mg via ORAL
  Filled 2022-04-10 (×2): qty 1

## 2022-04-10 NOTE — Evaluation (Signed)
Physical Therapy Evaluation Patient Details Name: [name] [name] MRN: [id number] DOB: 22-Feb-1961 Today's Date: 04/10/2022  History of Present Illness  Pt is a 61 y/o male presenting to ED for chest pain, admitted for NSTEMI work up. Recent admission 8/8 involving cardiac arrest, CPR and resultant anterior rib fx (2-7) and midsternum. 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 Pt requiring min A for bed mobility and min guard A to stand at EOB. Pt reports he does not walk without prosthetic and did not have prosthetic with him, so further mobility deferred. Pt reports he was working with [facility] prior to admission. Recommending continuing HHPT at d/c. Will continue to follow acutely.        Recommendations for follow up therapy are one component of a multi-disciplinary discharge planning process, led by the attending physician.  Recommendations may be updated based on patient status, additional functional criteria and insurance authorization.  Follow Up Recommendations Home health PT (resume HHPT)      Assistance Recommended at Discharge Intermittent Supervision/Assistance  Patient can return home with the following  A little help with walking and/or transfers;Assist for transportation;Assistance with cooking/housework    Equipment Recommendations None recommended by PT  Recommendations for Other Services       Functional Status Assessment Patient has had a recent decline in their functional status and demonstrates the ability to make significant improvements in function in a reasonable and predictable amount of time.     Precautions / Restrictions Precautions Precautions: Fall Precaution Comments: L AKA, prosthesis (not present at time of eval) Restrictions Weight Bearing Restrictions: No Other Position/Activity Restrictions: hx of L AKA, does  not have prosthetic in room      Mobility  Bed Mobility Overal bed mobility: Needs Assistance Bed Mobility: Supine to Sit, Sit to Supine     Supine to sit: Min assist Sit to supine: Supervision   General bed mobility comments: Min A for elevating trunk into sitting    Transfers Overall transfer level: Needs assistance Equipment used: Rolling walker (2 wheels) Transfers: Sit to/from Stand Sit to Stand: Min guard           General transfer comment: BUE on RW to stand. Min guard for safety    Ambulation/Gait                  Stairs            Wheelchair Mobility    Modified Rankin (Stroke Patients Only)       Balance Overall balance assessment: Needs assistance Sitting-balance support: No upper extremity supported, Feet supported Sitting balance-Leahy Scale: Good     Standing balance support: Bilateral upper extremity supported, During functional activity Standing balance-Leahy Scale: Poor Standing balance comment: Reliant on UE support                             Pertinent Vitals/Pain Pain Assessment Pain Assessment: No/denies pain    Home Living Family/patient expects to be discharged to:: Private residence Living Arrangements: Non-relatives/Friends Available Help at Discharge: Friend(s);Available 24 hours/day Type of Home: House Home Access: Ramped entrance       Home Layout: One level Home Equipment: Tub bench;Grab bars - tub/shower;Rolling Walker (2 wheels);Wheelchair - manual;BSC/3in1;Hand held shower head 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 Working with HHPT, uses w/c for mobility more often ADLs Comments: Friends manage IADLs, intermittent assist for ADLs (tub transfers, LB dressing as needed)     Hand Dominance   Dominant Hand: Left    Extremity/Trunk  Assessment   Upper Extremity Assessment Upper Extremity Assessment: Defer to OT evaluation    Lower Extremity Assessment Lower Extremity Assessment: LLE deficits/detail LLE Deficits / Details: L AKA at baseline    Cervical / Trunk Assessment Cervical / Trunk Assessment: Normal  Communication   Communication: No difficulties  Cognition Arousal/Alertness: Awake/alert Behavior During Therapy: WFL for tasks assessed/performed Overall Cognitive Status: Within Functional Limits for tasks assessed                                          General Comments General comments (skin integrity, edema, etc.): VSS on RA    Exercises     Assessment/Plan    PT Assessment Patient needs continued PT services  PT Problem List Decreased balance;Decreased mobility       PT Treatment Interventions DME instruction;Gait training;Stair training;Functional mobility training;Therapeutic activities;Therapeutic exercise;Balance training;Patient/family education    PT Goals (Current goals can be found in the Care Plan section)  Acute Rehab PT Goals Patient Stated Goal: to go home PT Goal Formulation: With patient Time For Goal Achievement: 04/24/22 Potential to Achieve Goals: Good    Frequency Min 3X/week     Co-evaluation PT/OT/SLP Co-Evaluation/Treatment: Yes Reason for Co-Treatment: Other (comment);For patient/therapist safety (imminent d/c) PT goals addressed during session: Mobility/safety with mobility;Balance OT goals addressed during session: ADL's and self-care;Strengthening/ROM       AM-PAC PT "6 Clicks" Mobility  Outcome Measure Help needed turning from your back to your side while in a flat bed without using bedrails?: A Little Help needed moving from lying on your back to sitting on the side of a flat bed without using bedrails?: A Little Help needed moving to and from a bed to a chair (including a wheelchair)?: A Little Help needed standing up from a chair  using your arms (e.g., wheelchair or bedside chair)?: A Little Help needed to walk in hospital room?: Total Help needed climbing 3-5 steps with a railing? : Total 6 Click Score: 14    End of Session   Activity Tolerance: Patient tolerated treatment well Patient left: in bed;with call bell/phone within reach (on stretcher in ED) Nurse Communication: Mobility status PT Visit Diagnosis: Other abnormalities of gait and mobility (R26.89)    Time: 6203-5597 PT Time Calculation (min) (ACUTE ONLY): 11 min   Charges:   PT Evaluation $PT Eval Low Complexity: 1 Low          Lou Miner, DPT  Acute Rehabilitation Services  Office: [phone]   Rudean Hitt 04/10/2022, 11:27 AM

## 2022-04-10 NOTE — Evaluation (Signed)
Occupational Therapy Evaluation/Discharge Patient Details Name: [name] [name] MRN: [id number] DOB: 03/18/1961 Today's Date: 04/10/2022   History of Present Illness Pt is a 61 y/o male presenting to ED for chest pain, admitted for NSTEMI work up. Recent admission 8/8 involving cardiac arrest, CPR and resultant anterior rib fx (2-7) and midsternum. PMH:  metastatic adenocarcinoma (currently on chemo), multiple DVT/PE on AC, HTN, HLD, CAD s/p CABG 2020   Clinical Impression   PTA, pt lives with roommates, typically Modified Independent with mobility/transfers using w/c primarily. Pt currently working with [facility] for mobility with RW/prosthetic LE. Pt reports typically independent with ADLs though has tub transfer/LB dressing assist intermittently. Pt presents now at baseline for ADLs/basic transfers w/o prosthetic LE present. Pt denies any pain or SOB. No further skilled OT services needed at acute level or on DC, endorses adequate support as needed at home.       Recommendations for follow up therapy are one component of a multi-disciplinary discharge planning process, led by the attending physician.  Recommendations may be updated based on patient status, additional functional criteria and insurance authorization.   Follow Up Recommendations  No OT follow up    Assistance Recommended at Discharge Intermittent Supervision/Assistance  Patient can return home with the following A little help with bathing/dressing/bathroom    Functional Status Assessment  Patient has not had a recent decline in their functional status  Equipment Recommendations  None recommended by OT    Recommendations for Other Services       Precautions / Restrictions Precautions Precautions: Fall Precaution Comments: L AKA, prosthesis (not present at time of eval) Restrictions Weight Bearing Restrictions: No Other Position/Activity Restrictions: hx of L AKA, does not have prosthetic in room      Mobility Bed  Mobility Overal bed mobility: Needs Assistance Bed Mobility: Supine to Sit, Sit to Supine     Supine to sit: Min assist Sit to supine: Supervision   General bed mobility comments: Min A for elevating trunk into sitting    Transfers Overall transfer level: Needs assistance Equipment used: Rolling walker (2 wheels) Transfers: Sit to/from Stand Sit to Stand: Min guard           General transfer comment: able to stand from stretcher with RW easily      Balance Overall balance assessment: Needs assistance Sitting-balance support: No upper extremity supported, Feet supported Sitting balance-Leahy Scale: Good     Standing balance support: Bilateral upper extremity supported, During functional activity Standing balance-Leahy Scale: Poor Standing balance comment: Reliant on UE support                           ADL either performed or assessed with clinical judgement   ADL Overall ADL's : At baseline Eating/Feeding: Set up;Sitting   Grooming: Set up;Sitting   Upper Body Bathing: Set up;Sitting   Lower Body Bathing: Minimal assistance;Sitting/lateral leans;Sit to/from stand   Upper Body Dressing : Set up;Sitting   Lower Body Dressing: Minimal assistance;Sitting/lateral leans;Sit to/from stand Lower Body Dressing Details (indicate cue type and reason): assist for sock mgmt, difficulty reaching on stretcher but reports he usually sits in w/c and props foot up on bed to complete task Toilet Transfer: Min guard;Squat-pivot                   Vision Ability to See in Adequate Light: 0 Adequate Patient Visual Report: No change from baseline Vision Assessment?: No apparent visual deficits  Perception     Praxis      Pertinent Vitals/Pain Pain Assessment Pain Assessment: No/denies pain     Hand Dominance Left   Extremity/Trunk Assessment Upper Extremity Assessment Upper Extremity Assessment: Overall WFL for tasks assessed   Lower Extremity  Assessment Lower Extremity Assessment: Defer to PT evaluation LLE Deficits / Details: AKA   Cervical / Trunk Assessment Cervical / Trunk Assessment: Normal   Communication Communication Communication: No difficulties   Cognition Arousal/Alertness: Awake/alert Behavior During Therapy: WFL for tasks assessed/performed Overall Cognitive Status: Within Functional Limits for tasks assessed                                       General Comments  VSS on RA    Exercises     Shoulder Instructions      Home Living Family/patient expects to be discharged to:: Private residence Living Arrangements: Non-relatives/Friends Available Help at Discharge: Friend(s);Available 24 hours/day Type of Home: House Home Access: Ramped entrance     Home Layout: One level     Bathroom Shower/Tub: Teacher, early years/pre: Standard Bathroom Accessibility: Yes How Accessible: Accessible via wheelchair Home Equipment: Tub bench;Grab bars - tub/shower;Rolling Walker (2 wheels);Wheelchair - manual;BSC/3in1;Hand held shower head   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 Working with HHPT, uses w/c for mobility more often ADLs Comments: Friends manage IADLs, intermittent assist for ADLs (tub transfers, LB dressing as needed)        OT Problem List:        OT Treatment/Interventions:      OT Goals(Current goals can be found in the care plan section) Acute Rehab OT Goals Patient Stated Goal: go home soon, keep working with HHPT OT Goal Formulation: All assessment and education complete, DC therapy  OT Frequency:      Co-evaluation PT/OT/SLP Co-Evaluation/Treatment: Yes Reason for Co-Treatment: Other (comment) (imminent DC)   OT goals addressed during session: ADL's and  self-care;Strengthening/ROM      AM-PAC OT "6 Clicks" Daily Activity     Outcome Measure Help from another person eating meals?: None Help from another person taking care of personal grooming?: A Little Help from another person toileting, which includes using toliet, bedpan, or urinal?: A Little Help from another person bathing (including washing, rinsing, drying)?: A Little Help from another person to put on and taking off regular upper body clothing?: A Little Help from another person to put on and taking off regular lower body clothing?: A Little 6 Click Score: 19   End of Session Equipment Utilized During Treatment: Rolling walker (2 wheels) Nurse Communication: Mobility status  Activity Tolerance: Patient tolerated treatment well Patient left: in bed;with call bell/phone within reach  OT Visit Diagnosis: Other abnormalities of gait and mobility (R26.89)                Time: 4401-0272 OT Time Calculation (min): 10 min Charges:  OT General Charges $OT Visit: 1 Visit OT Evaluation $OT Eval Low Complexity: 1 Low  Malachy Chamber, OTR/L Acute Rehab Services Office: [phone]   Layla Maw 04/10/2022, 11:10 AM

## 2022-04-10 NOTE — Final Consult Note (Addendum)
Final Consult Note    Patient Name: [name] [name] Date of Encounter: 04/10/2022  [facility] Cardiologist: Candee Furbish, MD  Subjective   Resting comfortably.  No CP - just feels tired & weak   Inpatient Medications    Scheduled Meds:  apixaban  5 mg Oral BID   atorvastatin  80 mg Oral q1800   clopidogrel  75 mg Oral Daily   DULoxetine  30 mg Oral Daily   isosorbide mononitrate  30 mg Oral Daily   lidocaine  1 patch Transdermal Q24H   potassium chloride  40 mEq Oral Q6H   Continuous Infusions:  ferric gluconate (FERRLECIT) IVPB     ondansetron (ZOFRAN) IV     PRN Meds: acetaminophen **OR** acetaminophen, ondansetron **OR** ondansetron (ZOFRAN) IV, oxyCODONE, senna-docusate   Vital Signs    Vitals:   04/10/22 1115 04/10/22 1415 04/10/22 1433 04/10/22 1545  BP: (!) 197/78 126/78 126/78 135/76  Pulse: 98  92 93  Resp: '13 16 16 20  '$ Temp: 98.4 F (36.9 C)  98.7 F (37.1 C)   TempSrc: Oral  Oral   SpO2: 97% 95% 98% 94%    Intake/Output Summary (Last 24 hours) at 04/10/2022 1623 Last data filed at 04/09/2022 2005 Gross per 24 hour  Intake 1000 ml  Output --  Net 1000 ml      04/06/2022    8:00 AM 03/23/2022    1:33 PM 03/18/2022    5:00 AM  Last 3 Weights  Weight (lbs) 136 lb 9.6 oz 145 lb 6.4 oz 142 lb  Weight (kg) 61.961 kg 65.953 kg 64.411 kg      Telemetry    SR - Personally Reviewed  ECG    SR - ~ S Tachy ~99 bpm - with PVCs,  Left Axis Deviation. Inferior MI, age indeterminate; anteroseptal MI, age indeterminate.  - Personally Reviewed  Physical Exam   GEN: No acute distress.  Chronically ill appearing/ disheveled  Neck: No JVD Cardiac: RRR, no murmurs, rubs, or gallops.  Respiratory: Clear to auscultation bilaterally. GI: Soft, nontender, non-distended  MS: No edema; No deformity. Neuro:  Nonfocal  Psych: Normal affect   Labs    High Sensitivity Troponin:   Recent Labs  Lab 03/11/22 1930 03/11/22 2113 04/09/22 1421  04/09/22 1730  TROPONINIHS 63* 76* 529* 441*     Chemistry Recent Labs  Lab 04/09/22 1421 04/09/22 1730 04/10/22 0415  NA 133*  --  137  K 3.6  --  3.3*  CL 98  --  106  CO2 24  --  22  GLUCOSE 103*  --  90  BUN 24*  --  19  CREATININE 0.95  --  0.84  CALCIUM 8.7*  --  8.3*  MG  --  1.8  --   PROT 7.3  --   --   ALBUMIN 3.0*  --   --   AST 26  --   --   ALT 20  --   --   ALKPHOS 297*  --   --   BILITOT 0.7  --   --   GFRNONAA >60  --  >60  ANIONGAP 11  --  9    Lipids No results for input(s): "CHOL", "TRIG", "HDL", "LABVLDL", "LDLCALC", "CHOLHDL" in the last 168 hours.  Hematology Recent Labs  Lab 04/09/22 1421  WBC 5.9  RBC 4.29  HGB 10.9*  HCT 35.3*  MCV 82.3  MCH 25.4*  MCHC 30.9  RDW 16.0*  PLT 159   Thyroid No results for input(s): "TSH", "FREET4" in the last 168 hours.  BNPNo results for input(s): "BNP", "PROBNP" in the last 168 hours.  DDimer No results for input(s): "DDIMER" in the last 168 hours.   Radiology    DG Chest 2 View  Result Date: 04/09/2022 CLINICAL DATA:  Shortness of breath EXAM: CHEST - 2 VIEW COMPARISON:  03/11/2022 CT, chest radiograph and prior studies FINDINGS: Cardiomegaly and RIGHT IJ Port-A-Cath with tip overlying the SUPERIOR cavoatrial junction again noted. There is no evidence of focal airspace disease, pulmonary edema, suspicious pulmonary nodule/mass, pleural effusion, or pneumothorax. No acute bony abnormalities are identified. Remote bilateral rib fractures again noted. IMPRESSION: Cardiomegaly without evidence of acute cardiopulmonary disease. Electronically Signed   By: Margarette Canada M.D.   On: 04/09/2022 16:16    Cardiac Studies   No new studies.  Previous cath films reviewed.  Small either caliber nondominant RCA occluded with widely patent left circumflex and flex and LAD system. Diagnostic cardiac cath 02/06/2022: Dominance: Left   Echo 01/2022: EF 60-65%. Normal Diastolic parameters. IVS flattened in systole w/ RVP  overload. RV Severely dilated with mid-basal HK c/w McConell's sign but normal RAP.   Patient Profile     61 y.o. male  with metastatic adenocarcinoma (likely esophagus primary) on chemo, hx of VF cardiac arrest and NSTEMI managed medically on 01/2022 (CTO of non dominant RCA), moderately reduced RV function and severe RV dilation, PAD s/p L AKA, COPD, hx of DVT / PE on apixaban presents with chest pain. Suspect supply-demand in setting of recent nausea / vomiting and recent chemo initiation.   Assessment & Plan    42M   CAD, troponin elevation => likely demand ischemia in setting of nausea vomiting from chemotherapy.  Has occluded codominant if not nondominant RCA with widely patent left system by recent cath.  Unlikely that he has new event. - continue plavix for one year after medically managed NSTEMI in 01/2022 ==> with no active symptoms, I would agree that we will need to consider relook cath. - continue atorvastatin, last LDL 31 at goal-but could consider reducing dose to 40 mg instead of 80. - recommend starting imdur 30 mg - continue amlodipine 10 mg, and could consider low-dose beta-blocker (metoprolol tartrate 12.5 mg twice daily) - d/c heparin gtt and resume home apixaban   Hx of VF arrest- etiology thought to be ischemic and EF normal on echo. - no mention of ICD in prior notes -was also not discharged on antiarrhythmic agent.  Had initially been on amiodarone but that was discontinued.  I do think beta-blocker would be reasonable. -  Suspected plan was conservative management given his overall poor prognosis.   RV dysfunction (McConnell sign on echocardiogram is consistent with findings during PE) - likely group 4 in setting of thromboembolic disease - CTA chest on 02/27/22 noted segmental and subsegmental PE - volume status normal - resume home apixaban   Hx of PE / DVT - resume home apixaban  He is status post left AKA and wheelchair-bound at baseline.  Also history of prior  CVA.   Primary team is thinking potential discharge.  I think if he is stable and feeling well enough, without any chest pain on exertion, I think it is fine for him to be discharged.  Center will sign off.   Medication Recommendations:  agree with adding Imdur 30 mg, and consider low-dose beta-blocker-metoprolol tatrate 12.5 mg twice daily Other recommendations (labs,  testing, etc): No additional testing Follow up as an outpatient: I do not see that he has outpatient cardiology appointment scheduled.  We will put him on the list to set up an appointment.  For questions or updates, please contact [facility] Please consult [email or web address] for contact info under        Signed, Glenetta Hew, MD  04/10/2022, 4:23 PM

## 2022-04-10 NOTE — Discharge Summary (Incomplete)
Name: [name] [name] MRN: [id number] DOB: 1961-06-19 61 y.o. PCP: Angelique Blonder, DO  Date of Admission: 04/09/2022  1:46 PM Date of Discharge: No discharge date for patient encounter. Attending Physician: Aldine Contes, MD  Discharge Diagnosis: Principal Problem:   Chest pain Active Problems:   NSTEMI (non-ST elevated myocardial infarction) [facility])     Discharge Medications: Allergies as of 04/10/2022       Reactions   Dilaudid [hydromorphone Hcl] Rash   Ramipril Rash, Other (See Comments)   Per patient on R arm and leg only; no angioedema   Tramadol Itching, Rash     Med Rec must be completed prior to using this [facility]***        Disposition and follow-up:    [name] [name] was discharged from [facility] in Stable condition.  At the hospital follow up visit please address:   1.  Ensure patient's symptoms of chest pain and tightness have completely resolved, verify adherence to home medication regimen, monitor response to chemotherapy from oncologist Dr Learta Codding  2.  Labs / imaging needed at time of follow-up: none  3.  Pending labs/ test needing follow-up: none    Follow-up Appointments:  [facility] hospital follow-up on 10-25 Please continue to see your oncologist for chemotherapy   Hospital Course by problem list:  ---Non-ST segment elevation myocardial infarction ---Coronary artery disease post CABG 2020 and arrest 2023 Patient has history of extensive CAD including CABG in 2020 and a complete proximal RCA obstruction identified 01-2022. He presented with new-onset chest pain, ECG negative for ST-elevation but troponin levels were high and then decreased since admission suggesting recent NSTEMI. Etiology likely supply-demand mismatch secondary to nausea-vomiting and physiologic stress from initiation of chemotherapy on 10-14. Cardiology recommended medical management rather than catheterization. Atorvastatin,  clopidogrel, and apixaban were prescribed prior to discharge. > Consult cardiology, appreciate recommendations > Atorvastatin '80mg'$  q24 > Clopidogrel xx > Apixaban x > Cardiac telemetry > Monitor blood pressure closely > Hold home isosorbide-mononitrate and amlodipine     ---Chronic right ventricular dysfunction ---History of deep venous thrombosis and pulmonary emboli History of chronic left lower and right upper lobe pulmonary emboli identified with multiple CTA studies within the last year. Etiology likely hypercoagulability secondary to known metastatic adenocarcinoma. Patient recently reported non-compliance with anticoagulation, but has been taking apixaban regularly since last hospital discharge in 02-2022. Most recent echocardiogram in 01-2022 demonstrated severely dilated right ventricle with basal and midportion hypokinesis but hyperkinesis in apex, pulmonary arterial pressure normal. Etiology likely multifactorial including comorbid COPD and history of multiple PE. Signs of volume overload have been absent throughout hospitalization.      ---Metastatic adenocarcinoma [facility] outpatient care from oncologist Dr Learta Codding. Multiple masses have been identified in the liver, retroperitoneal lymph nodes, and esophagogastric junction. Labs notable for AFP 2.4, CA 19.9, and CEA 287. A PET scan performed 02-2022 revealed hypermetabolic masses in the distal esophagus, liver, periportal lymph nodes, and adrenal glands. Chemotherapy with FolFOx, trastuzumab, and pembrolizumab was initiated on 10-14, possibly a contributing factor to his recent MI.     ---Chronic normocytic anemia  Hemoglobin upon arrival was 10.9, which is within his baseline range of about 9.0-11.0. Laboratory testing in 01-2022 demonstrated iron 13, saturation 5, and ferritin 151. Etiology likely multifactorial including iron deficiency and acute plus chronic disease. Iron supplementation with ferric gluconate was initiated during  hospitalization. > Ferric gluconate '250mg'$  q24, last dose 10-18 > Trend CBC q24     ---  Hypertension ---Hyperlipidemia ---Peripheral artery disease post above-knee-amputation 2021 Patient found to have severe bilateral PAD in 01-2019. He underwent left above-knee amputation in 04-2020 and ambulates with a prosthesis on the LLE, currently wheelchair bound. Most recent lipid panel collected 9-5 demonstrated Trig 186 and HDL 26 with normal LDL. Prescribed atorvastatin, amlodipine, and chlorthalidone at home but takes none of these medications regularly. > Atorvastatin '80mg'$  q24 > Monitor blood pressure closely > Hold home amlodipine and chlorthalidone     Discharge Exam:    BP 126/78   Pulse 98   Temp 98.4 F (36.9 C) (Oral)   Resp 16   SpO2 95%   Subjective:  Patient reports feeling good this morning with improvement of his chest pain. Confirmed that he takes apixaban regularly while at home. He has started chemotherapy, without radiation. Discussed workup for heart attack versus other cause of elevated troponin levels. Upon discharge, patient prefers to return home where he has 24-hour support.  Constitutional: chronically ill-appearing man sitting in bed, not in acute distress HENT: normocephalic atraumatic, mucous membranes moist, partial dentures in place Cardiovascular: regular rate and rhythm without murmurs, palpable pulses in extremities, no JVD Pulmonary/Chest: normal work of breathing on room air, lungs clear to auscultation bilaterally, no crackles or wheezing Abdominal: soft, non-tender and non-distended Neurological: awake and alert, oriented to person-place-time Skin: warm and dry Psych: Normal mood and affect   Pertinent Labs, Studies, and Procedures:   Labs:     Latest Ref Rng & Units 04/09/2022    2:21 PM 03/23/2022    1:40 PM 03/16/2022   11:47 AM  CBC  WBC 4.0 - 10.5 K/uL 5.9  8.9  7.9   Hemoglobin 13.0 - 17.0 g/dL 10.9  11.2  10.7   Hematocrit 39.0 - 52.0  % 35.3  36.4  35.1   Platelets 150 - 400 K/uL 159  185  254       Latest Ref Rng & Units 04/10/2022    4:15 AM 04/09/2022    2:21 PM 03/23/2022    1:40 PM  CMP  Glucose 70 - 99 mg/dL 90  103  165   BUN 8 - 23 mg/dL '19  24  17   '$ Creatinine 0.61 - 1.24 mg/dL 0.84  0.95  0.85   Sodium 135 - 145 mmol/L 137  133  139   Potassium 3.5 - 5.1 mmol/L 3.3  3.6  3.7   Chloride 98 - 111 mmol/L 106  98  105   CO2 22 - 32 mmol/L '22  24  27   '$ Calcium 8.9 - 10.3 mg/dL 8.3  8.7  9.3   Total Protein 6.5 - 8.1 g/dL  7.3  7.3   Total Bilirubin 0.3 - 1.2 mg/dL  0.7  0.3   Alkaline Phos 38 - 126 U/L  297  328   AST 15 - 41 U/L  26  31   ALT 0 - 44 U/L  20  14    Troponin I - 529, 441  ______________________  Imaging:  DG Chest 2 View Result Date: 04/09/2022 IMPRESSION: Cardiomegaly without evidence of acute cardiopulmonary disease.   ______________________  Procedures:  none  ______________________   Discharge Instructions:  [name] [name],  It was a pleasure taking care of you while you were in the hospital. Your chest pain was caused by lack of blood flow to your heart, which is similar to a heart attack but no permanent damage was done. We are refilling some of your home  medications, which you should take regularly to prevent a heart attack in the future:  - Atorvastatin  - Clopidogrel  - Apixaban   We will see you next week in the Internal Medicine Clinic. Please return to the emergency department if your chest pain returns or worsens.   Signed: Roswell Nickel, MD Internal Medicine PGY-1 Pager [phone]

## 2022-04-10 NOTE — Progress Notes (Signed)
Subjective:  Patient reports feeling good this morning with improvement of his chest pain. Confirmed that he takes apixaban regularly while at home. He has started chemotherapy, without radiation. Discussed workup for heart attack versus other cause of elevated troponin levels. Upon discharge, patient prefers to return home where he has 24-hour support.    Objective: Vitals over previous 24hr: Vitals:   04/10/22 1115 04/10/22 1415 04/10/22 1433 04/10/22 1545  BP: (!) 197/78 126/78 126/78 135/76  Pulse: 98  92 93  Resp: '13 16 16 20  '$ Temp: 98.4 F (36.9 C)  98.7 F (37.1 C)   TempSrc: Oral  Oral   SpO2: 97% 95% 98% 94%    General:      awake and alert, sitting comfortably in bed, cooperative, not in acute distress Skin:       warm and dry, intact without any obvious lesions or scars, no rashes Eyes:      extraocular movements intact, conjunctivae pink, no periorbital swelling or scleral icterus Lungs:      normal respiratory effort, breathing unlabored, symmetrical chest rise, no crackles or wheezing Cardiac:      regular rate and rhythm, normal S1 and S2, no pitting edema Abdomen:      soft and non-distended, no tenderness to palpation or guarding Musculoskeletal:  above knee amputation in LLE Neurologic:      oriented to person-place-time, moving all extremities, no gross focal deficits Psychiatric:      mood and affect normal, intelligible speech    Assessment/Plan: Mr [name] [name] is a 61 year old male with a past medical history of metastatic adenocarcinoma on chemotherapy, multiple DVT-PE on anticoagulation, hypertension, hyperlipidemia, and CAD post CABG 6144 complicated by arrest 3154 who presented with chest pain, now admitted for management of NSTEMI.   ---Non-ST segment elevation myocardial infarction ---Coronary artery disease post CABG 2020 and arrest 2023 Patient has history of extensive CAD including CABG in 2020 and a complete proximal RCA obstruction  identified 01-2022. He presented with new-onset chest pain, ECG negative for ST-elevation but troponin levels high and have been down-trending suggesting recent NSTEMI. Etiology likely supply-demand mismatch secondary to nausea-vomiting and physiologic stress from recent initiation of chemotherapy on 10-14. Cardiology recommending medical management. Blood pressure has remained in low-normal range throughout hospitalization, treatment team is currently holding home amlodipine given poor right ventricular dysfunction at baseline. > Consult cardiology, appreciate recommendations > Atorvastatin '80mg'$  q24 > Clopidogrel '75mg'$  q24 > Apixaban '5mg'$  q24 > Isosorbide mononitrate '30mg'$  q24 > Cardiac telemetry > Monitor blood pressure closely > Hold home amlodipine   ---Chronic right ventricular dysfunction ---History of deep venous thrombosis and pulmonary emboli History of chronic left lower and right upper lobe pulmonary emboli identified with multiple CTA studies within the last year. Etiology likely hypercoagulability secondary to known metastatic adenocarcinoma. Patient recently reported non-compliance with anticoagulation, but has been taking apixaban regularly since last hospital discharge in 02-2022. Most recent echocardiogram in 01-2022 demonstrated severely dilated right ventricle with basal and midportion hypokinesis but hyperkinesis in apex, pulmonary arterial pressure normal. Etiology likely multifactorial including comorbid COPD and history of multiple PE. Signs of volume overload absent on exam. > Apixaban '5mg'$  q24   ---Metastatic adenocarcinoma [facility] outpatient care from oncologist Dr Learta Codding. Multiple masses have been identified in the liver, retroperitoneal lymph nodes, and esophagogastric junction. Labs notable for AFP 2.4, CA 19.9, and CEA 287. A PET scan performed 02-2022 revealed hypermetabolic masses in the distal esophagus, liver, periportal lymph nodes, and adrenal glands. Chemotherapy with  FolFOx, trastuzumab, and pembrolizumab was initiated on 10-14. > Ondansetron '8mg'$  q6 PRN > Monitor clinically for signs of toxicity > Repeat TTE in 05-2022 to evaluate LV function   ---Chronic normocytic anemia  Hemoglobin upon arrival was 10.9, which is within his baseline range of about 9.0-11.0. Laboratory testing in 01-2022 demonstrated iron 13, saturation 5, and ferritin 151. Etiology likely multifactorial including iron deficiency and acute plus chronic disease. > Ferric gluconate '250mg'$  q24, last dose 10-18 > Trend CBC q24   ---History of fractured sternum and multiple anterior ribs ---Low back pain and cervical radiculopathy Patient underwent CPR for cardiac arrest during recent hospitalization 01-2022, resulting in fractures of ribs 2 through 7 bilaterally and mid-sternum. Pain has been successfully managed with lidocaine patch and oxycodone at home. > Duloxetine '30mg'$  q24 > Lidocaine patch 5% q24 > Oxycodone '10mg'$  q6 PRN > Acetaminophen '650mg'$  q6 PRN   ---Hypertension ---Hyperlipidemia ---Peripheral artery disease post above-knee-amputation 2021 Patient found to have severe bilateral PAD in 01-2019. He underwent left above-knee amputation in 04-2020 and now ambulates with a prosthesis on the LLE. He is currently wheelchair bound. Most recent lipid panel demonstrated Trig 186 and HDL 26 with normal LDL. Prescribed atorvastatin, amlodipine, and chlorthalidone at home but takes none of these medications regularly. > Atorvastatin '80mg'$  q24 > Monitor blood pressure closely > Hold home amlodipine and chlorthalidone   ---Rheumatoid arthritis  History of RA involving bilateral hands. Previously took prednisone, which was discontinued on 01-2022. Given recent induction of chemotherapy, close monitoring for possible RA flares is warranted. > Monitor clinically   ---Chronic obstructive pulmonary disease History of COPD with most recent PFT in 2013 demonstrating FEV1/FVC 71% that improved to  81% after bronchodilator. Oxygen saturation has been normal on room air throughout current hospitalization. > Monitor clinically   ---History of cerebrovascular accident 2023 Brain MRI performed 02-2022 revealed multiple acute-subacute ischemic infarcts in supratentorial cerebral white matter, cerebellum, L PCA territory, R basal ganglia, L thalamus, and pons. Etiology likely multifactorial possibly including hypercoagulability, neoplastic emboli, and chromic microvascular changes. > Monitor clinically    Principal Problem:   Chest pain Active Problems:   NSTEMI (non-ST elevated myocardial infarction) [facility])    Prior to Admission Living Arrangement: home living with friend Anticipated Discharge Location: home Barriers to Discharge: medical management, symptom resolution Dispo: Anticipated discharge in approximately 1-2 day(s).    Roswell Nickel, MD Internal Medicine PGY-1 Pager [phone]  After 5pm on weekdays and 1pm on weekends: On Call pager [phone]

## 2022-04-10 NOTE — Care Management Obs Status (Signed)
MEDICARE OBSERVATION STATUS NOTIFICATION   Patient Details  Name: [name] [name] MRN: [id number] Date of Birth: [birth year]   Medicare Observation Status Notification Given:   yes    Laurena Slimmer, RN 04/10/2022, 11:11 PM

## 2022-04-11 ENCOUNTER — Encounter (HOSPITAL_COMMUNITY): Payer: Self-pay | Admitting: Internal Medicine

## 2022-04-11 ENCOUNTER — Other Ambulatory Visit: Payer: Medicare Other

## 2022-04-11 ENCOUNTER — Ambulatory Visit: Payer: Medicare Other | Admitting: Nurse Practitioner

## 2022-04-11 ENCOUNTER — Other Ambulatory Visit (HOSPITAL_COMMUNITY): Payer: Self-pay

## 2022-04-11 DIAGNOSIS — R7989 Other specified abnormal findings of blood chemistry: Secondary | ICD-10-CM

## 2022-04-11 DIAGNOSIS — I2510 Atherosclerotic heart disease of native coronary artery without angina pectoris: Secondary | ICD-10-CM | POA: Diagnosis not present

## 2022-04-11 DIAGNOSIS — I214 Non-ST elevation (NSTEMI) myocardial infarction: Secondary | ICD-10-CM | POA: Diagnosis not present

## 2022-04-11 DIAGNOSIS — I25119 Atherosclerotic heart disease of native coronary artery with unspecified angina pectoris: Secondary | ICD-10-CM

## 2022-04-11 DIAGNOSIS — Z87891 Personal history of nicotine dependence: Secondary | ICD-10-CM | POA: Diagnosis not present

## 2022-04-11 DIAGNOSIS — R079 Chest pain, unspecified: Secondary | ICD-10-CM | POA: Diagnosis not present

## 2022-04-11 LAB — BASIC METABOLIC PANEL
Anion gap: 8 (ref 5–15)
BUN: 19 mg/dL (ref 8–23)
CO2: 23 mmol/L (ref 22–32)
Calcium: 9.1 mg/dL (ref 8.9–10.3)
Chloride: 104 mmol/L (ref 98–111)
Creatinine, Ser: 0.83 mg/dL (ref 0.61–1.24)
GFR, Estimated: >60 mL/min (ref >60)
Glucose, Bld: 103 mg/dL — ABNORMAL HIGH (ref 70–99)
Potassium: 4.6 mmol/L (ref 3.5–5.1)
Sodium: 135 mmol/L (ref 135–145)

## 2022-04-11 MED ORDER — METOPROLOL SUCCINATE ER 25 MG PO TB24
25.0000 mg | ORAL_TABLET | Freq: Every day | ORAL | 0 refills | Status: DC
  Filled 2022-04-11: qty 30, 30d supply, fill #0

## 2022-04-11 MED ORDER — ATORVASTATIN CALCIUM 40 MG PO TABS: 40.0000 mg | ORAL_TABLET | Freq: Every day | ORAL | Status: DC

## 2022-04-11 MED ORDER — CLOPIDOGREL BISULFATE 75 MG PO TABS: 75.0000 mg | ORAL_TABLET | Freq: Every day | ORAL | 2 refills | Status: DC

## 2022-04-11 MED ORDER — ATORVASTATIN CALCIUM 40 MG PO TABS
40.0000 mg | ORAL_TABLET | Freq: Every day | ORAL | 0 refills | Status: DC
  Filled 2022-04-11: qty 30, 30d supply, fill #0

## 2022-04-11 MED ORDER — ISOSORBIDE MONONITRATE ER 30 MG PO TB24
30.0000 mg | ORAL_TABLET | Freq: Every day | ORAL | 0 refills | Status: DC
  Filled 2022-04-11: qty 30, 30d supply, fill #0

## 2022-04-11 NOTE — Discharge Instructions (Signed)
[name] [name],  It was a pleasure taking care of you while you were in the hospital. Your chest pain was caused by lack of blood flow to your heart, which caused a small heart attack without any lasting permanent damage. We are refilling some of your home medications, which you should take regularly to prevent another heart attack in the future:  - Atorvastatin '40mg'$  daily - Clopidogrel '75mg'$  daily - Apixaban '5mg'$  twice-daily - Isosorbide-mononitrate '30mg'$  daily - Metoprolol tartrate 12.'5mg'$  twice-daily  We will see you next week 10-25 in the Internal Medicine Clinic. Please return to the emergency department if your chest pain returns.

## 2022-04-12 ENCOUNTER — Ambulatory Visit: Payer: Medicare Other

## 2022-04-12 ENCOUNTER — Telehealth: Payer: Self-pay

## 2022-04-12 NOTE — Patient Outreach (Signed)
  Care Coordination [facility] Note Transition Care Management Follow-up Telephone Call Date of discharge and from where: [facility] 04/09/22-04/11/22 How have you been since you were released from the hospital? Per patient's emergency contact, Mr. Kozak is doing okay.  His daughter is patients caregiver. Any questions or concerns? No  Items Reviewed: Did the pt receive and understand the discharge instructions provided? Yes  Medications obtained and verified? Yes  Other? No  Any new allergies since your discharge? No  Dietary orders reviewed? No Do you have support at home? Yes   Home Care and Equipment/Supplies: Were home health services ordered? not applicable If so, what is the name of the agency? N/A  Has the agency set up a time to come to the patient's home? not applicable Were any new equipment or medical supplies ordered?  No What is the name of the medical supply agency? N/A Were you able to get the supplies/equipment? not applicable Do you have any questions related to the use of the equipment or supplies? No  Functional Questionnaire: (I = Independent and D = Dependent) ADLs: Patients emergency contact not aware of functional status  Bathing/Dressing- N/A  Meal Prep- N/A  Eating- N/A  Maintaining continence- N/A  Transferring/Ambulation- N/A  Managing Meds- N/A  Follow up appointments reviewed:  PCP Hospital f/u appt confirmed? Yes  Scheduled to see Dr. Allyson Sabal on 04/19/22 @ 0915.[name] [place] [facility] f/u appt confirmed? No   Are transportation arrangements needed? No  If their condition worsens, is the pt aware to call PCP or go to the Emergency Dept.? Yes Was the patient provided with contact information for the PCP's office or ED? Yes Was to pt encouraged to call back with questions or concerns? Yes  SDOH assessments and interventions completed:   Yes  Care Coordination Interventions Activated:  Yes   Care Coordination Interventions:  No Care Coordination  interventions needed at this time.   Encounter Outcome:  Pt. Visit Completed

## 2022-04-14 ENCOUNTER — Inpatient Hospital Stay: Payer: Medicare Other

## 2022-04-14 NOTE — Progress Notes (Signed)
[place] CSW Progress Note  Clinical Education officer, museum returned caregivers call.  Timmothy Sours stated patient keeps falling out of bed and feels bed rails would help.  He also inquired about the status of a new w/c.  CSW followed-up with [employer] who provides free DME.  They stated they had a w/c and halo available for pick-up next Tuesday from 10-2 at their store in [place].  Informed Don.  He asked if [place] [facility] could refurbish his current w/c.  CSW sent an email to [place] with the inquiry.  Caregiver expressed no other needs.   Rodman Pickle Malique Driskill, LCSW

## 2022-04-15 ENCOUNTER — Other Ambulatory Visit: Payer: Self-pay | Admitting: Oncology

## 2022-04-18 ENCOUNTER — Inpatient Hospital Stay: Payer: Medicare Other

## 2022-04-18 ENCOUNTER — Encounter: Payer: Self-pay | Admitting: Nurse Practitioner

## 2022-04-18 ENCOUNTER — Inpatient Hospital Stay: Payer: Medicare Other | Admitting: Licensed Clinical Social Worker

## 2022-04-18 ENCOUNTER — Inpatient Hospital Stay (HOSPITAL_BASED_OUTPATIENT_CLINIC_OR_DEPARTMENT_OTHER): Payer: Medicare Other | Admitting: Nurse Practitioner

## 2022-04-18 VITALS — BP 132/90 | HR 82 | Resp 18

## 2022-04-18 DIAGNOSIS — C801 Malignant (primary) neoplasm, unspecified: Secondary | ICD-10-CM

## 2022-04-18 DIAGNOSIS — C155 Malignant neoplasm of lower third of esophagus: Secondary | ICD-10-CM

## 2022-04-18 DIAGNOSIS — C787 Secondary malignant neoplasm of liver and intrahepatic bile duct: Secondary | ICD-10-CM | POA: Diagnosis not present

## 2022-04-18 DIAGNOSIS — Z5111 Encounter for antineoplastic chemotherapy: Secondary | ICD-10-CM | POA: Diagnosis not present

## 2022-04-18 DIAGNOSIS — Z95828 Presence of other vascular implants and grafts: Secondary | ICD-10-CM

## 2022-04-18 DIAGNOSIS — Z79899 Other long term (current) drug therapy: Secondary | ICD-10-CM | POA: Diagnosis not present

## 2022-04-18 LAB — CBC WITH DIFFERENTIAL (CANCER CENTER ONLY)
Abs Immature Granulocytes: 0.01 10*3/uL (ref 0.00–0.07)
Basophils Absolute: 0 10*3/uL (ref 0.0–0.1)
Basophils Relative: 1 %
Eosinophils Absolute: 0.1 10*3/uL (ref 0.0–0.5)
Eosinophils Relative: 2 %
HCT: 32.7 % — ABNORMAL LOW (ref 39.0–52.0)
Hemoglobin: 10.2 g/dL — ABNORMAL LOW (ref 13.0–17.0)
Immature Granulocytes: 0 %
Lymphocytes Relative: 19 %
Lymphs Abs: 0.9 10*3/uL (ref 0.7–4.0)
MCH: 25.2 pg — ABNORMAL LOW (ref 26.0–34.0)
MCHC: 31.2 g/dL (ref 30.0–36.0)
MCV: 80.9 fL (ref 80.0–100.0)
Monocytes Absolute: 0.3 10*3/uL (ref 0.1–1.0)
Monocytes Relative: 6 %
Neutro Abs: 3.3 10*3/uL (ref 1.7–7.7)
Neutrophils Relative %: 72 %
Platelet Count: 148 10*3/uL — ABNORMAL LOW (ref 150–400)
RBC: 4.04 MIL/uL — ABNORMAL LOW (ref 4.22–5.81)
RDW: 16.2 % — ABNORMAL HIGH (ref 11.5–15.5)
WBC Count: 4.6 10*3/uL (ref 4.0–10.5)
nRBC: 0 % (ref 0.0–0.2)

## 2022-04-18 LAB — CMP (CANCER CENTER ONLY)
ALT: 24 U/L (ref 0–44)
AST: 23 U/L (ref 15–41)
Albumin: 3.6 g/dL (ref 3.5–5.0)
Alkaline Phosphatase: 200 U/L — ABNORMAL HIGH (ref 38–126)
Anion gap: 10 (ref 5–15)
BUN: 19 mg/dL (ref 8–23)
CO2: 24 mmol/L (ref 22–32)
Calcium: 9.3 mg/dL (ref 8.9–10.3)
Chloride: 102 mmol/L (ref 98–111)
Creatinine: 1.05 mg/dL (ref 0.61–1.24)
GFR, Estimated: >60 mL/min (ref >60)
Glucose, Bld: 114 mg/dL — ABNORMAL HIGH (ref 70–99)
Potassium: 3.5 mmol/L (ref 3.5–5.1)
Sodium: 136 mmol/L (ref 135–145)
Total Bilirubin: 0.4 mg/dL (ref 0.3–1.2)
Total Protein: 7 g/dL (ref 6.5–8.1)

## 2022-04-18 LAB — TSH: TSH: 2.354 u[IU]/mL (ref 0.350–4.500)

## 2022-04-18 MED ORDER — OXALIPLATIN CHEMO INJECTION 100 MG/20ML
85.0000 mg/m2 | Freq: Once | INTRAVENOUS | Status: AC
  Administered 2022-04-18: 150 mg via INTRAVENOUS
  Filled 2022-04-18: qty 10

## 2022-04-18 MED ORDER — SODIUM CHLORIDE 0.9 % IV SOLN
10.0000 mg | Freq: Once | INTRAVENOUS | Status: AC
  Administered 2022-04-18: 10 mg via INTRAVENOUS
  Filled 2022-04-18: qty 10

## 2022-04-18 MED ORDER — ALTEPLASE 2 MG IJ SOLR
2.0000 mg | Freq: Once | INTRAMUSCULAR | Status: AC
  Administered 2022-04-18: 2 mg
  Filled 2022-04-18: qty 2

## 2022-04-18 MED ORDER — SODIUM CHLORIDE 0.9% FLUSH
10.0000 mL | INTRAVENOUS | Status: DC | PRN
  Administered 2022-04-18: 10 mL

## 2022-04-18 MED ORDER — DEXTROSE 5 % IV SOLN: Freq: Once | INTRAVENOUS | Status: AC

## 2022-04-18 MED ORDER — PALONOSETRON HCL INJECTION 0.25 MG/5ML
0.2500 mg | Freq: Once | INTRAVENOUS | Status: AC
  Administered 2022-04-18: 0.25 mg via INTRAVENOUS
  Filled 2022-04-18: qty 5

## 2022-04-18 MED ORDER — HEPARIN SOD (PORK) LOCK FLUSH 100 UNIT/ML IV SOLN
500.0000 [IU] | Freq: Once | INTRAVENOUS | Status: AC | PRN
  Administered 2022-04-18: 500 [IU]

## 2022-04-18 NOTE — Progress Notes (Signed)
Addendum to progress note entered.  Patient was hospitalized from 04/09/22-04/11/2022.

## 2022-04-18 NOTE — Patient Instructions (Signed)

## 2022-04-18 NOTE — Progress Notes (Signed)
Patient seen by Ned Card NP today  Vitals are within treatment parameters.  Labs reviewed by Ned Card NP and are within treatment parameters.  Per physician team, patient is ready for treatment. Please note that modifications are being made to the treatment plan including holding 5-fluorouracil and trastuzumab.

## 2022-04-18 NOTE — Patient Instructions (Signed)
[facility]  Discharge Instructions: Thank you for choosing [facility] to provide your oncology and hematology care.   If you have a lab appointment with the [facility], please go directly to the [facility] and check in at the registration area.   Wear comfortable clothing and clothing appropriate for easy access to any Portacath or PICC line.   We strive to give you quality time with your provider. You may need to reschedule your appointment if you arrive late (15 or more minutes).  Arriving late affects you and other patients whose appointments are after yours.  Also, if you miss three or more appointments without notifying the office, you may be dismissed from the clinic at the provider's discretion.      For prescription refill requests, have your pharmacy contact our office and allow 72 hours for refills to be completed.    Today you received the following chemotherapy and/or immunotherapy agents: oxaliplatin      To help prevent nausea and vomiting after your treatment, we encourage you to take your nausea medication as directed.  BELOW ARE SYMPTOMS THAT SHOULD BE REPORTED IMMEDIATELY: *FEVER GREATER THAN 100.4 F (38 C) OR HIGHER *CHILLS OR SWEATING *NAUSEA AND VOMITING THAT IS NOT CONTROLLED WITH YOUR NAUSEA MEDICATION *UNUSUAL SHORTNESS OF BREATH *UNUSUAL BRUISING OR BLEEDING *URINARY PROBLEMS (pain or burning when urinating, or frequent urination) *BOWEL PROBLEMS (unusual diarrhea, constipation, pain near the anus) TENDERNESS IN MOUTH AND THROAT WITH OR WITHOUT PRESENCE OF ULCERS (sore throat, sores in mouth, or a toothache) UNUSUAL RASH, SWELLING OR PAIN  UNUSUAL VAGINAL DISCHARGE OR ITCHING   Items with * indicate a potential emergency and should be followed up as soon as possible or go to the Emergency Department if any problems should occur.  Please show the CHEMOTHERAPY ALERT CARD or IMMUNOTHERAPY ALERT CARD at check-in to  the Emergency Department and triage nurse.  Should you have questions after your visit or need to cancel or reschedule your appointment, please contact [facility]  Dept: [phone]  and follow the prompts.  Office hours are 8:00 a.m. to 4:30 p.m. Monday - Friday. Please note that voicemails left after 4:00 p.m. may not be returned until the following business day.  We are closed weekends and major holidays. You have access to a nurse at all times for urgent questions. Please call the main number to the clinic Dept: [phone] and follow the prompts.   For any non-urgent questions, you may also contact your provider using MyChart. We now offer e-Visits for anyone 38 and older to request care online for non-urgent symptoms. For details visit [email or web address].   Also download the MyChart app! Go to the app store, search "MyChart", open the app, select [facility], and log in with your MyChart username and password.  Masks are optional in the cancer centers. If you would like for your care team to wear a mask while they are taking care of you, please let them know. You may have one support person who is at least 62 years old accompany you for your appointments.  Oxaliplatin Injection What is this medication? OXALIPLATIN (ox AL i PLA tin) treats some types of cancer. It works by slowing down the growth of cancer cells. This medicine may be used for other purposes; ask your health care provider or pharmacist if you have questions. COMMON BRAND NAME(S): Eloxatin What should I tell my care team before I take this  medication? They need to know if you have any of these conditions: Heart disease History of irregular heartbeat or rhythm Liver disease Low blood cell levels (white cells, red cells, and platelets) Lung or breathing disease, such as asthma Take medications that treat or prevent blood clots Tingling of the fingers, toes, or other nerve disorder An  unusual or allergic reaction to oxaliplatin, other medications, foods, dyes, or preservatives If you or your partner are pregnant or trying to get pregnant Breast-feeding How should I use this medication? This medication is injected into a vein. It is given by your care team in a hospital or clinic setting. Talk to your care team about the use of this medication in children. Special care may be needed. Overdosage: If you think you have taken too much of this medicine contact a poison control center or emergency room at once. NOTE: This medicine is only for you. Do not share this medicine with others. What if I miss a dose? Keep appointments for follow-up doses. It is important not to miss a dose. Call your care team if you are unable to keep an appointment. What may interact with this medication? Do not take this medication with any of the following: Cisapride Dronedarone Pimozide Thioridazine This medication may also interact with the following: Aspirin and aspirin-like medications Certain medications that treat or prevent blood clots, such as warfarin, apixaban, dabigatran, and rivaroxaban Cisplatin Cyclosporine Diuretics Medications for infection, such as acyclovir, adefovir, amphotericin B, bacitracin, cidofovir, foscarnet, ganciclovir, gentamicin, pentamidine, vancomycin NSAIDs, medications for pain and inflammation, such as ibuprofen or naproxen Other medications that cause heart rhythm changes Pamidronate Zoledronic acid This list may not describe all possible interactions. Give your health care provider a list of all the medicines, herbs, non-prescription drugs, or dietary supplements you use. Also tell them if you smoke, drink alcohol, or use illegal drugs. Some items may interact with your medicine. What should I watch for while using this medication? Your condition will be monitored carefully while you are receiving this medication. You may need blood work while taking this  medication. This medication may make you feel generally unwell. This is not uncommon as chemotherapy can affect healthy cells as well as cancer cells. Report any side effects. Continue your course of treatment even though you feel ill unless your care team tells you to stop. This medication may increase your risk of getting an infection. Call your care team for advice if you get a fever, chills, sore throat, or other symptoms of a cold or flu. Do not treat yourself. Try to avoid being around people who are sick. Avoid taking medications that contain aspirin, acetaminophen, ibuprofen, naproxen, or ketoprofen unless instructed by your care team. These medications may hide a fever. Be careful brushing or flossing your teeth or using a toothpick because you may get an infection or bleed more easily. If you have any dental work done, tell your dentist you are receiving this medication. This medication can make you more sensitive to cold. Do not drink cold drinks or use ice. Cover exposed skin before coming in contact with cold temperatures or cold objects. When out in cold weather wear warm clothing and cover your mouth and nose to warm the air that goes into your lungs. Tell your care team if you get sensitive to the cold. Talk to your care team if you or your partner are pregnant or think either of you might be pregnant. This medication can cause serious birth defects if taken   during pregnancy and for 9 months after the last dose. A negative pregnancy test is required before starting this medication. A reliable form of contraception is recommended while taking this medication and for 9 months after the last dose. Talk to your care team about effective forms of contraception. Do not father a child while taking this medication and for 6 months after the last dose. Use a condom while having sex during this time period. Do not breastfeed while taking this medication and for 3 months after the last dose. This  medication may cause infertility. Talk to your care team if you are concerned about your fertility. What side effects may I notice from receiving this medication? Side effects that you should report to your care team as soon as possible: Allergic reactions--skin rash, itching, hives, swelling of the face, lips, tongue, or throat Bleeding--bloody or black, tar-like stools, vomiting blood or brown material that looks like coffee grounds, red or dark brown urine, small red or purple spots on skin, unusual bruising or bleeding Dry cough, shortness of breath or trouble breathing Heart rhythm changes--fast or irregular heartbeat, dizziness, feeling faint or lightheaded, chest pain, trouble breathing Infection--fever, chills, cough, sore throat, wounds that don't heal, pain or trouble when passing urine, general feeling of discomfort or being unwell Liver injury--right upper belly pain, loss of appetite, nausea, light-colored stool, dark yellow or brown urine, yellowing skin or eyes, unusual weakness or fatigue Low red blood cell level--unusual weakness or fatigue, dizziness, headache, trouble breathing Muscle injury--unusual weakness or fatigue, muscle pain, dark yellow or brown urine, decrease in amount of urine Pain, tingling, or numbness in the hands or feet Sudden and severe headache, confusion, change in vision, seizures, which may be signs of posterior reversible encephalopathy syndrome (PRES) Unusual bruising or bleeding Side effects that usually do not require medical attention (report to your care team if they continue or are bothersome): Diarrhea Nausea Pain, redness, or swelling with sores inside the mouth or throat Unusual weakness or fatigue Vomiting This list may not describe all possible side effects. Call your doctor for medical advice about side effects. You may report side effects to FDA at 1-800-FDA-1088. Where should I keep my medication? This medication is given in a hospital or  clinic. It will not be stored at home. NOTE: This sheet is a summary. It may not cover all possible information. If you have questions about this medicine, talk to your doctor, pharmacist, or health care provider.  2023 [employer]/Gold Standard (2021-10-07 00:00:00) 

## 2022-04-18 NOTE — Progress Notes (Deleted)
Office Visit    Patient Name: [name] [name] Date of Encounter: 04/18/2022  Primary Care Provider:  Angelique Blonder, DO Primary Cardiologist:  Candee Furbish, MD Primary Electrophysiologist: None  Chief Complaint    [name] [name] is a 61 y.o. male with PMH nonobstructive CAD s/p LHC 2018,VF cardiac arrest and NSTEMI managed medically on 01/2022 (CTO of non dominant RCA),   moderately reduced RV function and severe RV dilation, Libman-Sacks endocarditis,  lupus/RA with chronic pain, HTN, HLD, GERD, PAD s/p L AKA, popliteal and tibial DVT (on Eliquis) who presents today for posthospital follow-up of chest pain.  Past Medical History    Past Medical History:  Diagnosis Date   Abnormal CT scan, gastrointestinal tract    Acute respiratory failure ([place]) 01/31/2022   Acute respiratory failure with hypoxemia (HCC) 02/11/2022   Anemia    Anemia 08/06/2012   Aortic valve mass 12/30/2015   Bilateral shoulder pain 08/20/2012   Bite wound of left hand 09/27/2021   Cardiogenic shock ([place]) 01/31/2022   Community acquired pneumonia 11/07/2011   Critical limb ischemia with history of revascularization of same extremity ([place]) 04/28/2020   DDD (degenerative disc disease), lumbosacral     and grade 2 slip   DVT (deep venous thrombosis) ([place]) 01/31/2022   GERD (gastroesophageal reflux disease)    H/O: stroke 01/31/2022   Heart murmur    History of anemia    no current med.   History of COVID-19 05/10/2019   History of critical lower limb ischemia 02/19/2019   Hyperglycemia 01/31/2022   Hypertension    states is borderline on medication; has been on med. x 5-6 yr.   Hypokalemia 05/30/2018   Impingement syndrome of shoulder region 10/2014   left   Insomnia 06/01/2015   Ischemic ulcer of toe of left foot (HCC)    Lactic acidosis 01/31/2022   Left shoulder pain 12/16/2015   Liver mass    Low back pain without sciatica 10/29/2009   Lupus ([place])    Multiple fractures of ribs, bilateral, due to CPR trauma 01/31/2022    Onychomycosis 01/28/2021   Paronychia of great toe 01/28/2021   Peripheral vascular disease ([place])    Pneumonia    Pressure injury of skin 01/31/2022   Pulmonary contusion 01/31/2022   RA (rheumatoid arthritis) ([place])    Rotator cuff tear 11/04/2014   STEMI (ST elevation myocardial infarction) ([place]) 01/31/2022   Sternal fracture 01/31/2022   Transaminitis 12/16/2015   Past Surgical History:  Procedure Laterality Date   ABDOMINAL AORTAGRAM  02/20/2019   ABDOMINAL AORTOGRAM W/LOWER EXTREMITY N/A 02/20/2019   Procedure: ABDOMINAL AORTOGRAM W/LOWER EXTREMITY;  Surgeon: Marty Heck, MD;  Location: [facility];  Service: Cardiovascular;  Laterality: N/A;   ACHILLES TENDON SURGERY Bilateral    AMPUTATION Left 05/11/2020   Procedure: LEFT ABOVE KNEE AMPUTATION;  Surgeon: Angelia Mould, MD;  Location: [facility];  Service: Vascular;  Laterality: Left;   ENDARTERECTOMY POPLITEAL Left 05/01/2020   Procedure: ENDARTERECTOMY POPLITEAL;  Surgeon: Waynetta Sandy, MD;  Location: [facility];  Service: Vascular;  Laterality: Left;   FASCIOTOMY Left 05/01/2020   Procedure: FASCIOTOMY;  Surgeon: Waynetta Sandy, MD;  Location: [facility];  Service: Vascular;  Laterality: Left;   FEMORAL-POPLITEAL BYPASS GRAFT Left 02/26/2019   Procedure: BYPASS GRAFT FEMORAL-POPLITEAL ARTERY LEFT LEG USING 57m PROPATEN GRAFT;  Surgeon: BSerafina Mitchell MD;  Location: [facility]  Service: Vascular;  Laterality: Left;   INTRAOPERATIVE ARTERIOGRAM Left 01/22/2020  Procedure: INTRA OPERATIVE ARTERIOGRAM with administration of thrombolyics in Left femoral to popliteal bypass.;  Surgeon: Marty Heck, MD;  Location: [facility];  Service: Vascular;  Laterality: Left;   IR IMAGING GUIDED PORT INSERTION  03/07/2022   LEFT HEART CATH AND CORONARY ANGIOGRAPHY N/A 03/15/2017   Procedure: LEFT HEART CATH AND CORONARY ANGIOGRAPHY;  Surgeon: Nelva Bush, MD;  Location: [facility];  Service: Cardiovascular;   Laterality: N/A;   LEFT HEART CATH AND CORONARY ANGIOGRAPHY N/A 02/06/2022   Procedure: LEFT HEART CATH AND CORONARY ANGIOGRAPHY;  Surgeon: Martinique, Peter M, MD;  Location: [facility];  Service: Cardiovascular;  Laterality: N/A;   LOWER EXTREMITY INTERVENTION Left 04/29/2020   Procedure: LOWER EXTREMITY INTERVENTION- LYSIS;  Surgeon: Marty Heck, MD;  Location: [facility];  Service: Cardiovascular;  Laterality: Left;   OSTEOTOMY AND ULNAR SHORTENING Right 07/22/2002   PATCH ANGIOPLASTY Left 05/01/2020   Procedure: PATCH ANGIOPLASTY USING HEMASHIELD PLATINUM FINESSE PATCH;  Surgeon: Waynetta Sandy, MD;  Location: [facility];  Service: Vascular;  Laterality: Left;   PERIPHERAL VASCULAR ATHERECTOMY  01/23/2020   Procedure: PERIPHERAL VASCULAR ATHERECTOMY;  Surgeon: Marty Heck, MD;  Location: [facility];  Service: Cardiovascular;;   PERIPHERAL VASCULAR BALLOON ANGIOPLASTY  01/23/2020   Procedure: PERIPHERAL VASCULAR BALLOON ANGIOPLASTY;  Surgeon: Marty Heck, MD;  Location: [facility];  Service: Cardiovascular;;   PERIPHERAL VASCULAR INTERVENTION Left 04/30/2020   Procedure: PERIPHERAL VASCULAR INTERVENTION;  Surgeon: Cherre Robins, MD;  Location: [facility];  Service: Cardiovascular;  Laterality: Left;   PERIPHERAL VASCULAR THROMBECTOMY N/A 01/23/2020   Procedure: lysis recheck;  Surgeon: Marty Heck, MD;  Location: [facility];  Service: Cardiovascular;  Laterality: N/A;  + [place]    PERIPHERAL VASCULAR THROMBECTOMY N/A 04/30/2020   Procedure: Lysis Recheck;  Surgeon: Cherre Robins, MD;  Location: [facility];  Service: Cardiovascular;  Laterality: N/A;   SHOULDER ARTHROSCOPY WITH BICEPSTENOTOMY Right 03/11/2015   Procedure: SHOULDER ARTHROSCOPY WITH BICEPSTENOTOMY;  Surgeon: Ninetta Lights, MD;  Location: [facility];  Service: Orthopedics;  Laterality: Right;   SHOULDER ARTHROSCOPY WITH  DISTAL CLAVICLE RESECTION Left 11/19/2014   Procedure: SHOULDER ARTHROSCOPY WITH DISTAL CLAVICLE RESECTION;  Surgeon: Kathryne Hitch, MD;  Location: [facility];  Service: Orthopedics;  Laterality: Left;   SHOULDER ARTHROSCOPY WITH ROTATOR CUFF REPAIR AND SUBACROMIAL DECOMPRESSION Left 11/19/2014   Procedure: LEFT SHOULDER ARTHROSCOPY, DEBRIDEMENT DISTAL CLAVICLE EXCISION, ACROMIOPLASTY WITH ROTATOR CUFF REPAIR ;  Surgeon: Kathryne Hitch, MD;  Location: [facility];  Service: Orthopedics;  Laterality: Left;   THROMBECTOMY OF BYPASS GRAFT FEMORAL- POPLITEAL ARTERY Left 05/01/2020   Procedure: THROMBECTOMY OF LOWER EXTREMITY;  Surgeon: Waynetta Sandy, MD;  Location: [facility];  Service: Vascular;  Laterality: Left;   TRANSFORAMINAL LUMBAR INTERBODY FUSION (TLIF) WITH PEDICLE SCREW FIXATION 1 LEVEL N/A 01/03/2018   Procedure: TRANSFORAMINAL LUMBAR INTERBODY FUSION (TLIF) LUMBAR FIVE-SACRAL ONE;  Surgeon: Melina Schools, MD;  Location: [facility];  Service: Orthopedics;  Laterality: N/A;   ULTRASOUND GUIDANCE FOR VASCULAR ACCESS Right 01/22/2020   Procedure: ULTRASOUND GUIDANCE FOR VASCULAR ACCESS Right Common Femoral Artery.;  Surgeon: Marty Heck, MD;  Location: [facility];  Service: Vascular;  Laterality: Right;   VIDEO ASSISTED THORACOSCOPY (VATS)/DECORTICATION Right 11/21/2011   drainage of empyema    Allergies  Allergies  Allergen Reactions   Dilaudid [Hydromorphone Hcl] Rash   Ramipril Rash and Other (  See Comments)    Per patient on R arm and leg only; no angioedema   Tramadol Itching and Rash    History of Present Illness    [name] [name]  is a 61 year old male with the above mention past medical history who presents today for follow-up of recent complaint of chest pain.  Yesterday was as extensive cardiac and peripheral vascular history with VF arrest and NSTEMI 01/2022 s/p LHC with CTO of nondominant RCA treated medically.  Patient endorsed sporadic chest pain  that occurred over several days and was left-sided in nature with pressure lasting minutes.  He denied any chest tenderness or syncope.  He was recently diagnosed with malignant neoplasm of the esophagus and started chemotherapy and developed nausea and vomiting. EKG was negative for ST elevation but troponin levels were high 529 - 441. TTE on 01/2022 showed normal EF, moderately reduced RV function and severe RV dilation.  Troponins felt to be result of demand ischemia and relook cath found to be not necessary with recommendations to continue medical management. Patient recently reported non-compliance with anticoagulation, but has been taking apixaban regularly.  Since last being seen in the office patient reports***.  Patient denies chest pain, palpitations, dyspnea, PND, orthopnea, nausea, vomiting, dizziness, syncope, edema, weight gain, or early satiety.  ***Notes:  Home Medications    Current Outpatient Medications  Medication Sig Dispense Refill   acetaminophen (TYLENOL) 500 MG tablet Take 1,000 mg by mouth every 6 (six) hours as needed for mild pain.     apixaban (ELIQUIS) 5 MG TABS tablet Take 1 tablet (5 mg total) by mouth 2 (two) times daily. 60 tablet 0   atorvastatin (LIPITOR) 40 MG tablet Take 1 tablet (40 mg total) by mouth daily at 6 PM. 30 tablet 0   clopidogrel (PLAVIX) 75 MG tablet Take 1 tablet (75 mg total) by mouth daily. 30 tablet 2   diclofenac Sodium (VOLTAREN) 1 % GEL Apply 2-4 g topically in the morning, at noon, in the evening, and at bedtime.     DULoxetine (CYMBALTA) 30 MG capsule Take 30 mg by mouth daily as needed (depression).     feeding supplement (ENSURE ENLIVE / ENSURE PLUS) LIQD Take 237 mLs by mouth 2 (two) times daily between meals. 237 mL 12   gabapentin (NEURONTIN) 100 MG capsule Take 100 mg by mouth at bedtime as needed (for pain).      isosorbide mononitrate (IMDUR) 30 MG 24 hr tablet Take 1 tablet (30 mg total) by mouth daily. 30 tablet 0    lidocaine-prilocaine (EMLA) cream Apply 1 Application topically as needed. 30 g 0   metoprolol succinate (TOPROL-XL) 25 MG 24 hr tablet Take 1 tablet (25 mg total) by mouth daily. 30 tablet 0   ondansetron (ZOFRAN) 8 MG tablet TAKE 1 TABLET(8 MG) BY MOUTH EVERY 8 HOURS AS NEEDED FOR NAUSEA OR VOMITING (Patient taking differently: Take 8 mg by mouth every 8 (eight) hours as needed for nausea or vomiting.) 30 tablet 1   oxyCODONE-acetaminophen (PERCOCET) 10-325 MG tablet Take 1 tablet by mouth every 6 (six) hours as needed for pain. 120 tablet 0   prochlorperazine (COMPAZINE) 10 MG tablet TAKE 1 TABLET(10 MG) BY MOUTH EVERY 6 HOURS AS NEEDED FOR NAUSEA OR VOMITING (Patient taking differently: Take 10 mg by mouth every 6 (six) hours as needed for vomiting or nausea.) 60 tablet 0   No current facility-administered medications for this visit.   Facility-Administered Medications Ordered in Other Visits  Medication Dose Route Frequency Provider Last Rate Last Admin   dexamethasone (DECADRON) 10 mg in sodium chloride 0.9 % 50 mL IVPB  10 mg Intravenous Once Ladell Pier, MD 204 mL/hr at 04/18/22 1057 10 mg at 04/18/22 1057   heparin lock flush 100 unit/mL  500 Units Intracatheter Once PRN Ladell Pier, MD       oxaliplatin (ELOXATIN) 150 mg in dextrose 5 % 500 mL chemo infusion  85 mg/m2 (Treatment Plan Recorded) Intravenous Once Ladell Pier, MD       sodium chloride flush (NS) 0.9 % injection 10 mL  10 mL Intracatheter PRN Ladell Pier, MD         Review of Systems  Please see the history of present illness.    (+)*** (+)***  All other systems reviewed and are otherwise negative except as noted above.  Physical Exam    Wt Readings from Last 3 Encounters:  04/18/22 141 lb 1.6 oz (64 kg)  04/11/22 130 lb (59 kg)  04/06/22 136 lb 9.6 oz (62 kg)   WO:EHOZY were no vitals filed for this visit.,There is no height or weight on file to calculate BMI.  Constitutional:       Appearance: Healthy appearance. Not in distress.  Neck:     Vascular: JVD normal.  Pulmonary:     Effort: Pulmonary effort is normal.     Breath sounds: No wheezing. No rales. Diminished in the bases Cardiovascular:     Normal rate. Regular rhythm. Normal S1. Normal S2.      Murmurs: There is no murmur.  Edema:    Peripheral edema absent.  Abdominal:     Palpations: Abdomen is soft non tender. There is no hepatomegaly.  Skin:    General: Skin is warm and dry.  Neurological:     General: No focal deficit present.     Mental Status: Alert and oriented to person, place and time.     Cranial Nerves: Cranial nerves are intact.  EKG/LABS/Other Studies Reviewed    ECG personally reviewed by me today - ***  Risk Assessment/Calculations:    [id number] Score =    {Click here to calculate score.  REFRESH note before signing. :1} This indicates a  % annual risk of stroke. The patient's score is based upon:             Lab Results  Component Value Date   WBC 4.6 04/18/2022   HGB 10.2 (L) 04/18/2022   HCT 32.7 (L) 04/18/2022   MCV 80.9 04/18/2022   PLT 148 (L) 04/18/2022   Lab Results  Component Value Date   CREATININE 1.05 04/18/2022   BUN 19 04/18/2022   NA 136 04/18/2022   K 3.5 04/18/2022   CL 102 04/18/2022   CO2 24 04/18/2022   Lab Results  Component Value Date   ALT 24 04/18/2022   AST 23 04/18/2022   ALKPHOS 200 (H) 04/18/2022   BILITOT 0.4 04/18/2022   Lab Results  Component Value Date   CHOL 94 02/28/2022   HDL 26 (L) 02/28/2022   LDLCALC 31 02/28/2022   TRIG 186 (H) 02/28/2022   CHOLHDL 3.6 02/28/2022    Lab Results  Component Value Date   HGBA1C 5.1 02/03/2022    Assessment & Plan    1.  Coronary artery disease/demand ischemia: -s/p AD s/p LHC 2018,VF cardiac arrest and NSTEMI managed medically on 01/2022 (CTO of non dominant RCA) -Recent admission thought to be result of  demand ischemia from increased vomiting after initiating  chemotherapy. -Continue GDMT with Plavix, atorvastatin, Imdur, metoprolol  2.  HFpEF: 2D echo completed 01/2022 with moderately reduced RV function and severe RV dilation.  -Volume status today*** -CTA chest on 02/27/22 noted segmental and subsegmental PE  3.  Metastatic adenocarcinoma: -Currently followed by oncology with multiple liver masses identified. -Currently on chemotherapy  4.  History of PE/DVT: -He underwent left above-knee amputation in 04-2020 and ambulates with a prosthesis on the LLE, currently wheelchair bound -Continue Eliquis 5 mg twice daily  5.  Hyperlipidemia: -LDL was 31 at goal of less than 70 -Continue atorvastatin as noted above      Disposition: Follow-up with Candee Furbish, MD or APP in *** months {Are you ordering a CV Procedure (e.g. stress test, cath, DCCV, TEE, etc)?   Press F2        :[id number]}   Medication Adjustments/Labs and Tests Ordered: Current medicines are reviewed at length with the patient today.  Concerns regarding medicines are outlined above.   Signed, Mable Fill, Marissa Nestle, NP 04/18/2022, 10:58 AM [facility]  Note:  This document was prepared using Dragon voice recognition software and may include unintentional dictation errors.

## 2022-04-18 NOTE — Progress Notes (Signed)
[facility] CSW Progress Note  Holiday representative met with patient to assess needs and to provide support.  His friend, [name] [name], was also present.  Patient was alert and oriented and displayed a sense of humor.  He stated he is getting all of his needs met.  [name] [name] was going to obtain some additional DME while patient was receiving his infusion.  CSW provided contact information and literature about the [employer] and [employer].    [name] Pickle Breigh Annett, LCSW

## 2022-04-18 NOTE — Progress Notes (Signed)
Patient stated he still had port accessed from when he was in the hospital on the 10/20. Upon assessment, port was observed to still be accessed. Dressing was clean dry and intact with biopatch present. Needle was removed and port was reaccessed.  No blood return was noted.  Cathflo placed (See MAR).  Will check for blood return in about 1 hour.

## 2022-04-18 NOTE — Progress Notes (Signed)
[name] [name] OFFICE PROGRESS NOTE   Diagnosis: Esophagus cancer  INTERVAL HISTORY:   [name] [name] returns as scheduled.  He completed cycle 1 FOLFOX, trastuzumab, Pembrolizumab 04/06/2022.  He developed nausea/vomiting on day 2.  This lasted 3 to 4 days.  He feels home medications were effective.  On day 4 he developed chest pain and presented to the emergency department.  EKG was negative for ST elevation but troponin levels were high.  Chest pain felt to be supply/demand mismatch secondary to nausea/vomiting and physiologic stress from initiation of chemotherapy.  He was discharged home 04/11/2022.  He had 4-5 loose stools a day for 2 days.  No mouth sores.  No rash.  He denies dysphagia.  Overall good appetite.  He noted cold sensitivity involving the right hand for several days.  Objective:  Vital signs in last 24 hours:  Blood pressure 112/81, pulse 79, temperature 98.2 F (36.8 C), temperature source Oral, resp. rate 18, height _0  (1.702 m), weight 141 lb 1.6 oz (64 kg), SpO2 100 %.    HEENT: No thrush or ulcers. Resp: Distant breath sounds.  No respiratory distress. Cardio: Regular rate and rhythm. GI: No hepatosplenomegaly. Vascular: No right leg edema. Port-A-Cath without erythema.  Lab Results:  Lab Results  Component Value Date   WBC 4.6 04/18/2022   HGB 10.2 (L) 04/18/2022   HCT 32.7 (L) 04/18/2022   MCV 80.9 04/18/2022   PLT 148 (L) 04/18/2022   NEUTROABS 3.3 04/18/2022    Imaging:  No results found.  Medications: I have reviewed the patient's current medications.  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 -02/11/2022 CTA chest-7.8 x 8.5 cm subtle hypoattenuating mass lesion in segment IV of the liver. -02/11/2022 CT abdomen/pelvis-8.7 x 7.8 or ill-defined irregular mass in the medial  segment left liver, 4.5 x 3.4 cm necrotic mass lesion just cranial to the esophagogastric junction with metastatic lymphadenopathy in the abdomen and pelvis, irregular nodular peritoneal thickening in the lower abdomen and pelvis -02/11/2022 MRI abdomen-rim-enhancing likely necrotic mass or lymph node conglomerate centered around the gastroesophageal junction measuring 5.0 x 3.9 cm, large rim-enhancing likely necrotic liver mass centered in the anterior left lobe of the liver measuring 7.8 x 7.2 cm, enlarged gastrohepatic ligament and retroperitoneal lymph nodes -Labs from 02/11/2022-AFP 2.4, CA 19.9 88, CEA 287 -Ultrasound-guided liver biopsy performed 02/15/2022-adenocarcinoma with extensive necrosis, CK7 positive, negative for CK20, CDX2, TTF-1, and PAX8, consistent with a pancreatic, lung, or upper gastrointestinal primary -PET 03/08/2022-hypermetabolic distal esophagus mass, hypermetabolic centrally necrotic hepatic mass, hypermetabolic periportal nodes, exophytic GE junction mass without hypermetabolism-likely benign lesion, no hypermetabolism in the pancreas head, bilateral hypermetabolic adrenal gland lesions, small hypermetabolic left parotid gland lesion.  No loss of mismatch repair protein expression, HER2 3+ -CTs 03/11/2022-no change from PET 03/08/2022, known distal esophagus mass-not visualized on unenhanced CT, large hepatic metastasis and upper abdominal nodal metastases, chronic pancreatitis with 4.3 cm pseudocyst in the pancreas head -Cycle 1 FOLFOX, trastuzumab, Pembrolizumab 04/06/2022 -Cycle 2 oxaliplatin, 5-FU bolus/pump and trastuzumab held due to hospitalization following cycle 1 with possible coronary vasospasm 2.  Myocardial infarction/cardiac arrest 01/31/2022 3.  Microcytic anemia -Labs from 02/11/2022-ferritin 151, iron 13, percent saturation 5% 4.  DVT/pulmonary embolism-bilateral lower extremity DVTs 01/10/2022, pulmonary emboli on chest CT 01/31/2022-apixaban 5.  PAD status post AKA  in 2021 6.  RA 7.  Bilateral pleural effusions -Ultrasound-guided thoracentesis performed on  02/11/2022.  Cytology negative for malignancy. 8.  COPD 9.  Admission 03/11/2022 with E. coli bacteremia Ultrasound-guided biopsy/aspiration of dominant necrotic appearing liver lesion 03/17/2022-scant fluid aspirated-negative culture, no organisms or white cells.  Pathology with necrotic tissue.        Disposition: [name] [name] appears stable.  He has completed 1 cycle of FOLFOX, trastuzumab, Pembrolizumab.  He was admitted to the hospital on day 4 with chest pain.  Question coronary vasospasm related to 5-fluorouracil.  We decided to hold the 5-fluorouracil today and also hold trastuzumab pending his follow-up appointment with cardiology 04/19/2022.  [name] [name] agrees with this plan.  CBC and chemistry panel reviewed.  Labs adequate to proceed with Oxaliplatin as above.  He will return in 1 week for Pembrolizumab.  We will see him in follow-up in 2 weeks.  He will contact the office in the interim with any problems.  Patient seen with Dr. Benay Spice.  Ned Card ANP/GNP-BC   04/18/2022  9:36 AM This was a shared visit with Ned Card.  [name] [name] was interviewed and examined.  He completed 1 cycle of systemic therapy on 04/06/2022.  He was admitted 04/09/2022 with chest pain/dyspnea and nausea.  He was diagnosed with a NSTEMI.  Cardiology recommended medical therapy. It is possible the myocardial infarction was related to chemotherapy, specifically 5-fluorouracil.  We will hold 5-FU from the cycle of chemotherapy today.  Trastuzumab will also be placed on hold until he has a repeat echocardiogram.  I was present for greater than 50% of today's visit.  I performed medical decision making.  Julieanne Manson, MD

## 2022-04-19 ENCOUNTER — Other Ambulatory Visit: Payer: Medicare Other

## 2022-04-19 ENCOUNTER — Encounter: Payer: Self-pay | Admitting: Student

## 2022-04-19 ENCOUNTER — Ambulatory Visit (INDEPENDENT_AMBULATORY_CARE_PROVIDER_SITE_OTHER): Payer: Medicare Other | Admitting: Student

## 2022-04-19 ENCOUNTER — Ambulatory Visit: Payer: Medicare Other

## 2022-04-19 ENCOUNTER — Ambulatory Visit: Payer: Medicare Other | Admitting: Nurse Practitioner

## 2022-04-19 ENCOUNTER — Other Ambulatory Visit: Payer: Self-pay

## 2022-04-19 ENCOUNTER — Ambulatory Visit: Payer: Medicare Other | Attending: Nurse Practitioner | Admitting: Nurse Practitioner

## 2022-04-19 ENCOUNTER — Other Ambulatory Visit: Payer: Self-pay | Admitting: *Deleted

## 2022-04-19 VITALS — BP 138/87 | HR 77 | Temp 97.5°F

## 2022-04-19 DIAGNOSIS — Z981 Arthrodesis status: Secondary | ICD-10-CM

## 2022-04-19 DIAGNOSIS — C155 Malignant neoplasm of lower third of esophagus: Secondary | ICD-10-CM

## 2022-04-19 DIAGNOSIS — I214 Non-ST elevation (NSTEMI) myocardial infarction: Secondary | ICD-10-CM

## 2022-04-19 DIAGNOSIS — C801 Malignant (primary) neoplasm, unspecified: Secondary | ICD-10-CM

## 2022-04-19 DIAGNOSIS — I2489 Other forms of acute ischemic heart disease: Secondary | ICD-10-CM

## 2022-04-19 MED ORDER — OXYCODONE-ACETAMINOPHEN 10-325 MG PO TABS: 1.0000 | ORAL_TABLET | Freq: Four times a day (QID) | ORAL | 0 refills | Status: DC | PRN

## 2022-04-19 NOTE — Progress Notes (Signed)
CC: Hospital follow-up  HPI:  [name] is a 61 y.o. male living with a history stated below and presents today for hospital follow-up after NSTEMI. Please see problem based assessment and plan for additional details.  Past Medical History:  Diagnosis Date   Abnormal CT scan, gastrointestinal tract    Acute respiratory failure ([place]) 01/31/2022   Acute respiratory failure with hypoxemia (H[facility]) 02/11/2022   Anemia    Anemia 08/06/2012   Aortic valve mass 12/30/2015   Bilateral shoulder pain 08/20/2012   Bite wound of left hand 09/27/2021   Cardiogenic shock ([place]) 01/31/2022   Community acquired pneumonia 11/07/2011   Critical limb ischemia with history of revascularization of same extremity ([place]) 04/28/2020   DDD (degenerative disc disease), lumbosacral     and grade 2 slip   DVT (deep venous thrombosis) ([place]) 01/31/2022   GERD (gastroesophageal reflux disease)    H/O: stroke 01/31/2022   Heart murmur    History of anemia    no current med.   History of COVID-19 05/10/2019   History of critical lower limb ischemia 02/19/2019   Hyperglycemia 01/31/2022   Hypertension    states is borderline on medication; has been on med. x 5-6 yr.   Hypokalemia 05/30/2018   Impingement syndrome of shoulder region 10/2014   left   Insomnia 06/01/2015   Ischemic ulcer of toe of left foot (HCC)    Lactic acidosis 01/31/2022   Left shoulder pain 12/16/2015   Liver mass    Low back pain without sciatica 10/29/2009   Lupus ([place])    Multiple fractures of ribs, bilateral, due to CPR trauma 01/31/2022   Onychomycosis 01/28/2021   Paronychia of great toe 01/28/2021   Peripheral vascular disease ([place])    Pneumonia    Pressure injury of skin 01/31/2022   Pulmonary contusion 01/31/2022   RA (rheumatoid arthritis) ([place])    Rotator cuff tear 11/04/2014   STEMI (ST elevation myocardial infarction) ([place]) 01/31/2022   Sternal fracture 01/31/2022   Transaminitis 12/16/2015    Current Outpatient Medications on File Prior to Visit   Medication Sig Dispense Refill   acetaminophen (TYLENOL) 500 MG tablet Take 1,000 mg by mouth every 6 (six) hours as needed for mild pain.     apixaban (ELIQUIS) 5 MG TABS tablet Take 1 tablet (5 mg total) by mouth 2 (two) times daily. 60 tablet 0   atorvastatin (LIPITOR) 40 MG tablet Take 1 tablet (40 mg total) by mouth daily at 6 PM. 30 tablet 0   clopidogrel (PLAVIX) 75 MG tablet Take 1 tablet (75 mg total) by mouth daily. 30 tablet 2   diclofenac Sodium (VOLTAREN) 1 % GEL Apply 2-4 g topically in the morning, at noon, in the evening, and at bedtime.     DULoxetine (CYMBALTA) 30 MG capsule Take 30 mg by mouth daily as needed (depression).     feeding supplement (ENSURE ENLIVE / ENSURE PLUS) LIQD Take 237 mLs by mouth 2 (two) times daily between meals. 237 mL 12   gabapentin (NEURONTIN) 100 MG capsule Take 100 mg by mouth at bedtime as needed (for pain).      isosorbide mononitrate (IMDUR) 30 MG 24 hr tablet Take 1 tablet (30 mg total) by mouth daily. 30 tablet 0   lidocaine-prilocaine (EMLA) cream Apply 1 Application topically as needed. 30 g 0   metoprolol succinate (TOPROL-XL) 25 MG 24 hr tablet Take 1 tablet (25 mg total) by mouth daily. 30 tablet 0  ondansetron (ZOFRAN) 8 MG tablet TAKE 1 TABLET(8 MG) BY MOUTH EVERY 8 HOURS AS NEEDED FOR NAUSEA OR VOMITING (Patient taking differently: Take 8 mg by mouth every 8 (eight) hours as needed for nausea or vomiting.) 30 tablet 1   prochlorperazine (COMPAZINE) 10 MG tablet TAKE 1 TABLET(10 MG) BY MOUTH EVERY 6 HOURS AS NEEDED FOR NAUSEA OR VOMITING (Patient taking differently: Take 10 mg by mouth every 6 (six) hours as needed for vomiting or nausea.) 60 tablet 0   No current facility-administered medications on file prior to visit.    Review of Systems: ROS negative except for what is noted on the assessment and plan.  Vitals:   04/19/22 0928  BP: 138/87  Pulse: 77  Temp: (!) 97.5 F (36.4 C)  TempSrc: Oral  SpO2: 100%     Physical  Exam: Constitutional: chronic ill-appearing male, sitting in wheelchair, in no acute distress HENT: normocephalic atraumatic Neck: supple Cardiovascular: regular rate and rhythm, no m/r/g Pulmonary/Chest: normal work of breathing on room air, Port-A-Cath without erythema MSK: paraspinal tenderness in lumbar spine, left AKA Neurological: alert & oriented x 3 Skin: warm and dry Psych: normal mood and behavior  Assessment & Plan:   S/P lumbar fusion, Lower back pain Patient has history of chronic low back pain.  History of lumbar fusion.  He is on Percocet 10-325 mg. Does not take anything else for the pain. He has diclofenac gel which he applies as needed.   Plan -refilled Percocet 10-325 mg 30 day supply x 3  -may need to check [employer] at next visit  NSTEMI (non-ST elevated myocardial infarction) [facility]) Patient with hx CAD s/p CABG and complete occlusion of proximal RCA was recently hospitalized for NSTEMI after new onset of chest pain and shortness of breath. Etiology possibly from supply-demand mismatch due to nausea, vomiting and physiological stress after starting chemotherapy. He is on atorvastatin 40 mg daily, Imdur 30 mg daily and Toprol 25 mg daily. Also taking Plavix 75 mg daily and Eliquis 5 mg twice daily.   Today he denies any chest pain or shortness of breath.  He reports feeling well and has been adherent to his medications. Had chemotherapy session yesterday which they held 5-fluorouracil and trastuzumab until he sees cardiology. Denies nausea, vomiting, diarrhea following session. Since he is asymptomatic today, will continue medication regimen as listed at hospital discharge.   Plan -Continue atorvastatin, Imdur and Toprol, Plavix, Eliquis -cardiology appointment later today  Adenocarcinoma of unknown origin [facility]) Patient follows with oncology, Dr. Benay [name], and has started chemotherapy. Received chemotherapy yesterday which they held 5-fluorouracil and trastuzumab for  concern of coronary vasospasm. He denies any nausea, vomiting or diarrhea following the infusion. He reports feeling well compared to his initial chemotherapy session. His next session is in 2 weeks.    Patient seen with Dr. Alvin Critchley, D.O. [facility], PGY-1 Phone: [phone] Date 04/19/2022 Time 11:38 AM

## 2022-04-19 NOTE — Patient Instructions (Addendum)
Thank you, [name] [name] for allowing [place] to provide your care today. Today we discussed your recent hospitalization and back pain.    -I am glad to hear that you are feeling better after discharge from the hospital. Please continue taking your medications.   -I am glad that you are not having any side effects after your chemotherapy session yesterday.  -I have sent in a refill for the Percocet for your back pain.   I have ordered the following medication/changed the following medications:   Stop the following medications: Medications Discontinued During This Encounter  Medication Reason   oxyCODONE-acetaminophen (PERCOCET) 10-325 MG tablet Reorder     Start the following medications: Meds ordered this encounter  Medications   oxyCODONE-acetaminophen (PERCOCET) 10-325 MG tablet    Sig: Take 1 tablet by mouth every 6 (six) hours as needed for pain.    Dispense:  120 tablet    Refill:  0    Rx 1 of 3   oxyCODONE-acetaminophen (PERCOCET) 10-325 MG tablet    Sig: Take 1 tablet by mouth every 6 (six) hours as needed for pain.    Dispense:  120 tablet    Refill:  0    Rx 2 of 3   oxyCODONE-acetaminophen (PERCOCET) 10-325 MG tablet    Sig: Take 1 tablet by mouth every 6 (six) hours as needed for pain.    Dispense:  120 tablet    Refill:  0    Rx 3 of 3     Follow up: 2-3 months    Should you have any questions or concerns please call the internal medicine clinic at [phone].    Angelique Blonder, D.O. [facility]

## 2022-04-19 NOTE — Assessment & Plan Note (Deleted)
Patient with hx CAD s/p CABG and complete occlusion of proximal RCA was recently hospitalized for NSTEMI after new onset of chest pain and shortness of breath. Etiology possibly from supply-demand mismatch due to nausea, vomiting and physiological stress after starting chemotherapy. He is on atorvastatin 40 mg daily, Imdur 30 mg daily and Toprol 25 mg daily.  Today he denies any chest pain or shortness of breath.  He reports feeling well and has been adherent to his medications. Had chemotherapy session yesterday which they held 5-fluorouracil and trastuzumab until he sees cardiology. Denies nausea, vomiting, diarrhea following session.   Plan -Continue atorvastatin, Imdur and Toprol  -cardiology appointment later today

## 2022-04-19 NOTE — Assessment & Plan Note (Signed)
Patient follows with oncology, Dr. Benay Spice, and has started chemotherapy. Received chemotherapy yesterday which they held 5-fluorouracil and trastuzumab for concern of coronary vasospasm. He denies any nausea, vomiting or diarrhea following the infusion. He reports feeling well compared to his initial chemotherapy session. His next session is in 2 weeks.

## 2022-04-19 NOTE — Assessment & Plan Note (Signed)
Patient has history of chronic low back pain.  History of lumbar fusion.  He is on Percocet 10-325 mg. Does not take anything else for the pain. He has diclofenac gel which he applies as needed.   Plan -refilled Percocet 10-325 mg 30 day supply x 3  -may need to check ToxAssure at next visit

## 2022-04-19 NOTE — Assessment & Plan Note (Addendum)
Patient with hx CAD s/p CABG and complete occlusion of proximal RCA was recently hospitalized for NSTEMI after new onset of chest pain and shortness of breath. Etiology possibly from supply-demand mismatch due to nausea, vomiting and physiological stress after starting chemotherapy. He is on atorvastatin 40 mg daily, Imdur 30 mg daily and Toprol 25 mg daily. Also taking Plavix 75 mg daily and Eliquis 5 mg twice daily.   Today he denies any chest pain or shortness of breath.  He reports feeling well and has been adherent to his medications. Had chemotherapy session yesterday which they held 5-fluorouracil and trastuzumab until he sees cardiology. Denies nausea, vomiting, diarrhea following session. Since he is asymptomatic today, will continue medication regimen as listed at hospital discharge.   Plan -Continue atorvastatin, Imdur and Toprol, Plavix, Eliquis -cardiology appointment later today

## 2022-04-20 ENCOUNTER — Inpatient Hospital Stay: Payer: Medicare Other

## 2022-04-21 ENCOUNTER — Other Ambulatory Visit: Payer: Self-pay | Admitting: *Deleted

## 2022-04-21 DIAGNOSIS — C155 Malignant neoplasm of lower third of esophagus: Secondary | ICD-10-CM

## 2022-04-21 NOTE — Progress Notes (Signed)
Internal Medicine Clinic Attending  I saw and evaluated the patient.  I personally confirmed the key portions of the history and exam documented by Dr. Zheng and I reviewed pertinent patient test results.  The assessment, diagnosis, and plan were formulated together and I agree with the documentation in the resident's note.  

## 2022-04-24 ENCOUNTER — Telehealth: Payer: Self-pay

## 2022-04-24 NOTE — Telephone Encounter (Signed)
Returned VM from pt's friend, [name] [name], to clarify the pt's appt on 04/25/22. He verbalizes understanding.

## 2022-04-25 ENCOUNTER — Inpatient Hospital Stay: Payer: Medicare Other

## 2022-04-25 VITALS — BP 115/74 | HR 85 | Temp 97.8°F | Resp 18

## 2022-04-25 DIAGNOSIS — C155 Malignant neoplasm of lower third of esophagus: Secondary | ICD-10-CM

## 2022-04-25 DIAGNOSIS — Z79899 Other long term (current) drug therapy: Secondary | ICD-10-CM | POA: Diagnosis not present

## 2022-04-25 DIAGNOSIS — Z5111 Encounter for antineoplastic chemotherapy: Secondary | ICD-10-CM | POA: Diagnosis not present

## 2022-04-25 DIAGNOSIS — C787 Secondary malignant neoplasm of liver and intrahepatic bile duct: Secondary | ICD-10-CM | POA: Diagnosis not present

## 2022-04-25 DIAGNOSIS — C801 Malignant (primary) neoplasm, unspecified: Secondary | ICD-10-CM | POA: Diagnosis not present

## 2022-04-25 LAB — CMP (CANCER CENTER ONLY)
ALT: 24 U/L (ref 0–44)
AST: 32 U/L (ref 15–41)
Albumin: 3.4 g/dL — ABNORMAL LOW (ref 3.5–5.0)
Alkaline Phosphatase: 217 U/L — ABNORMAL HIGH (ref 38–126)
Anion gap: 9 (ref 5–15)
BUN: 10 mg/dL (ref 8–23)
CO2: 26 mmol/L (ref 22–32)
Calcium: 9.3 mg/dL (ref 8.9–10.3)
Chloride: 103 mmol/L (ref 98–111)
Creatinine: 0.75 mg/dL (ref 0.61–1.24)
GFR, Estimated: >60 mL/min (ref >60)
Glucose, Bld: 128 mg/dL — ABNORMAL HIGH (ref 70–99)
Potassium: 3.6 mmol/L (ref 3.5–5.1)
Sodium: 138 mmol/L (ref 135–145)
Total Bilirubin: 0.3 mg/dL (ref 0.3–1.2)
Total Protein: 6.9 g/dL (ref 6.5–8.1)

## 2022-04-25 MED ORDER — SODIUM CHLORIDE 0.9 % IV SOLN
200.0000 mg | Freq: Once | INTRAVENOUS | Status: AC
  Administered 2022-04-25: 200 mg via INTRAVENOUS
  Filled 2022-04-25: qty 8

## 2022-04-25 MED ORDER — SODIUM CHLORIDE 0.9 % IV SOLN: Freq: Once | INTRAVENOUS | Status: AC

## 2022-04-25 MED ORDER — SODIUM CHLORIDE 0.9% FLUSH
10.0000 mL | INTRAVENOUS | Status: DC | PRN
  Administered 2022-04-25: 10 mL

## 2022-04-25 MED ORDER — HEPARIN SOD (PORK) LOCK FLUSH 100 UNIT/ML IV SOLN
500.0000 [IU] | Freq: Once | INTRAVENOUS | Status: AC | PRN
  Administered 2022-04-25: 500 [IU]

## 2022-04-25 NOTE — Progress Notes (Signed)
Per Ned Card, NP ok to treat with CBC from 04/18/22.

## 2022-04-25 NOTE — Patient Instructions (Signed)

## 2022-04-25 NOTE — Patient Instructions (Signed)
[facility]  Discharge Instructions: Thank you for choosing [facility] to provide your oncology and hematology care.   If you have a lab appointment with the [facility], please go directly to the [facility] and check in at the registration area.   Wear comfortable clothing and clothing appropriate for easy access to any Portacath or PICC line.   We strive to give you quality time with your provider. You may need to reschedule your appointment if you arrive late (15 or more minutes).  Arriving late affects you and other patients whose appointments are after yours.  Also, if you miss three or more appointments without notifying the office, you may be dismissed from the clinic at the provider's discretion.      For prescription refill requests, have your pharmacy contact our office and allow 72 hours for refills to be completed.    Today you received the following chemotherapy and/or immunotherapy agents pembrolizumab      To help prevent nausea and vomiting after your treatment, we encourage you to take your nausea medication as directed.  BELOW ARE SYMPTOMS THAT SHOULD BE REPORTED IMMEDIATELY: *FEVER GREATER THAN 100.4 F (38 C) OR HIGHER *CHILLS OR SWEATING *NAUSEA AND VOMITING THAT IS NOT CONTROLLED WITH YOUR NAUSEA MEDICATION *UNUSUAL SHORTNESS OF BREATH *UNUSUAL BRUISING OR BLEEDING *URINARY PROBLEMS (pain or burning when urinating, or frequent urination) *BOWEL PROBLEMS (unusual diarrhea, constipation, pain near the anus) TENDERNESS IN MOUTH AND THROAT WITH OR WITHOUT PRESENCE OF ULCERS (sore throat, sores in mouth, or a toothache) UNUSUAL RASH, SWELLING OR PAIN  UNUSUAL VAGINAL DISCHARGE OR ITCHING   Items with * indicate a potential emergency and should be followed up as soon as possible or go to the Emergency Department if any problems should occur.  Please show the CHEMOTHERAPY ALERT CARD or IMMUNOTHERAPY ALERT CARD at check-in to  the Emergency Department and triage nurse.  Should you have questions after your visit or need to cancel or reschedule your appointment, please contact [facility]  Dept: [phone]  and follow the prompts.  Office hours are 8:00 a.m. to 4:30 p.m. Monday - Friday. Please note that voicemails left after 4:00 p.m. may not be returned until the following business day.  We are closed weekends and major holidays. You have access to a nurse at all times for urgent questions. Please call the main number to the clinic Dept: [phone] and follow the prompts.   For any non-urgent questions, you may also contact your provider using MyChart. We now offer e-Visits for anyone 43 and older to request care online for non-urgent symptoms. For details visit [email or web address].   Also download the MyChart app! Go to the app store, search "MyChart", open the app, select [facility], and log in with your MyChart username and password.  Masks are optional in the cancer centers. If you would like for your care team to wear a mask while they are taking care of you, please let them know. You may have one support person who is at least 62 years old accompany you for your appointments.  Pembrolizumab Injection What is this medication? PEMBROLIZUMAB (PEM broe LIZ ue mab) treats some types of cancer. It works by helping your immune system slow or stop the spread of cancer cells. It is a monoclonal antibody. This medicine may be used for other purposes; ask your health care provider or pharmacist if you have questions. COMMON BRAND NAME(S): Keytruda What  should I tell my care team before I take this medication? They need to know if you have any of these conditions: Allogeneic stem cell transplant (uses someone else's stem cells) Autoimmune diseases, such as Crohn disease, ulcerative colitis, lupus History of chest radiation Nervous system problems, such as Guillain-Barre syndrome,  myasthenia gravis Organ transplant An unusual or allergic reaction to pembrolizumab, other medications, foods, dyes, or preservatives Pregnant or trying to get pregnant Breast-feeding How should I use this medication? This medication is injected into a vein. It is given by your care team in a hospital or clinic setting. A special MedGuide will be given to you before each treatment. Be sure to read this information carefully each time. Talk to your care team about the use of this medication in children. While it may be prescribed for children as young as 6 months for selected conditions, precautions do apply. Overdosage: If you think you have taken too much of this medicine contact a poison control center or emergency room at once. NOTE: This medicine is only for you. Do not share this medicine with others. What if I miss a dose? Keep appointments for follow-up doses. It is important not to miss your dose. Call your care team if you are unable to keep an appointment. What may interact with this medication? Interactions have not been studied. This list may not describe all possible interactions. Give your health care provider a list of all the medicines, herbs, non-prescription drugs, or dietary supplements you use. Also tell them if you smoke, drink alcohol, or use illegal drugs. Some items may interact with your medicine. What should I watch for while using this medication? Your condition will be monitored carefully while you are receiving this medication. You may need blood work while taking this medication. This medication may cause serious skin reactions. They can happen weeks to months after starting the medication. Contact your care team right away if you notice fevers or flu-like symptoms with a rash. The rash may be red or purple and then turn into blisters or peeling of the skin. You may also notice a red rash with swelling of the face, lips, or lymph nodes in your neck or under your  arms. Tell your care team right away if you have any change in your eyesight. Talk to your care team if you may be pregnant. Serious birth defects can occur if you take this medication during pregnancy and for 4 months after the last dose. You will need a negative pregnancy test before starting this medication. Contraception is recommended while taking this medication and for 4 months after the last dose. Your care team can help you find the option that works for you. Do not breastfeed while taking this medication and for 4 months after the last dose. What side effects may I notice from receiving this medication? Side effects that you should report to your care team as soon as possible: Allergic reactions--skin rash, itching, hives, swelling of the face, lips, tongue, or throat Dry cough, shortness of breath or trouble breathing Eye pain, redness, irritation, or discharge with blurry or decreased vision Heart muscle inflammation--unusual weakness or fatigue, shortness of breath, chest pain, fast or irregular heartbeat, dizziness, swelling of the ankles, feet, or hands Hormone gland problems--headache, sensitivity to light, unusual weakness or fatigue, dizziness, fast or irregular heartbeat, increased sensitivity to cold or heat, excessive sweating, constipation, hair loss, increased thirst or amount of urine, tremors or shaking, irritability Infusion reactions--chest pain, shortness of breath   or trouble breathing, feeling faint or lightheaded Kidney injury (glomerulonephritis)--decrease in the amount of urine, red or dark brown urine, foamy or bubbly urine, swelling of the ankles, hands, or feet Liver injury--right upper belly pain, loss of appetite, nausea, light-colored stool, dark yellow or brown urine, yellowing skin or eyes, unusual weakness or fatigue Pain, tingling, or numbness in the hands or feet, muscle weakness, change in vision, confusion or trouble speaking, loss of balance or  coordination, trouble walking, seizures Rash, fever, and swollen lymph nodes Redness, blistering, peeling, or loosening of the skin, including inside the mouth Sudden or severe stomach pain, bloody diarrhea, fever, nausea, vomiting Side effects that usually do not require medical attention (report to your care team if they continue or are bothersome): Bone, joint, or muscle pain Diarrhea Fatigue Loss of appetite Nausea Skin rash This list may not describe all possible side effects. Call your doctor for medical advice about side effects. You may report side effects to FDA at 1-800-FDA-1088. Where should I keep my medication? This medication is given in a hospital or clinic. It will not be stored at home. NOTE: This sheet is a summary. It may not cover all possible information. If you have questions about this medicine, talk to your doctor, pharmacist, or health care provider.  2023 Elsevier/Gold Standard (2013-03-03 00:00:00)  

## 2022-04-26 ENCOUNTER — Other Ambulatory Visit: Payer: Self-pay | Admitting: *Deleted

## 2022-04-26 ENCOUNTER — Inpatient Hospital Stay: Payer: Medicare Other | Admitting: Nutrition

## 2022-04-26 DIAGNOSIS — C155 Malignant neoplasm of lower third of esophagus: Secondary | ICD-10-CM

## 2022-04-28 ENCOUNTER — Other Ambulatory Visit: Payer: Self-pay | Admitting: *Deleted

## 2022-04-28 ENCOUNTER — Telehealth: Payer: Self-pay | Admitting: *Deleted

## 2022-04-28 ENCOUNTER — Other Ambulatory Visit: Payer: Self-pay | Admitting: Student

## 2022-04-28 DIAGNOSIS — Z981 Arthrodesis status: Secondary | ICD-10-CM

## 2022-04-28 MED ORDER — OXYCODONE-ACETAMINOPHEN 10-325 MG PO TABS: 1.0000 | ORAL_TABLET | Freq: Four times a day (QID) | ORAL | 0 refills | Status: DC | PRN

## 2022-04-28 MED ORDER — OXYCODONE-ACETAMINOPHEN 10-325 MG PO TABS: 1.0000 | ORAL_TABLET | Freq: Four times a day (QID) | ORAL | 0 refills | Status: AC | PRN

## 2022-04-28 NOTE — Telephone Encounter (Signed)
Patient called in stating [facility] does not have Oxycodone in stock. He is requesting this be sent to [facility] instead. All 3 Rxs cancelled with [place] at [facility].

## 2022-04-28 NOTE — Telephone Encounter (Signed)
Call from pt who stated [facility] on [address] does not have Oxycodone. Requesting new rx sent to [facility]. Per previous telephone encounter, All 3 rx's were canceled at [facility] per triage.

## 2022-04-29 ENCOUNTER — Other Ambulatory Visit: Payer: Self-pay

## 2022-04-29 ENCOUNTER — Other Ambulatory Visit: Payer: Self-pay | Admitting: Oncology

## 2022-04-29 ENCOUNTER — Inpatient Hospital Stay (HOSPITAL_COMMUNITY)
Admission: EM | Admit: 2022-04-29 | Discharge: 2022-05-04 | DRG: 871 | Disposition: A | Discharge status: Home Health | Payer: Medicare Other | Attending: Infectious Diseases | Admitting: Infectious Diseases

## 2022-04-29 ENCOUNTER — Emergency Department (HOSPITAL_COMMUNITY): Payer: Medicare Other

## 2022-04-29 ENCOUNTER — Encounter (HOSPITAL_COMMUNITY): Payer: Self-pay | Admitting: *Deleted

## 2022-04-29 DIAGNOSIS — J449 Chronic obstructive pulmonary disease, unspecified: Secondary | ICD-10-CM | POA: Diagnosis present

## 2022-04-29 DIAGNOSIS — B961 Klebsiella pneumoniae [K. pneumoniae] as the cause of diseases classified elsewhere: Secondary | ICD-10-CM | POA: Diagnosis present

## 2022-04-29 DIAGNOSIS — C801 Malignant (primary) neoplasm, unspecified: Secondary | ICD-10-CM

## 2022-04-29 DIAGNOSIS — Z951 Presence of aortocoronary bypass graft: Secondary | ICD-10-CM

## 2022-04-29 DIAGNOSIS — K219 Gastro-esophageal reflux disease without esophagitis: Secondary | ICD-10-CM | POA: Diagnosis present

## 2022-04-29 DIAGNOSIS — R7881 Bacteremia: Secondary | ICD-10-CM | POA: Diagnosis not present

## 2022-04-29 DIAGNOSIS — Z8249 Family history of ischemic heart disease and other diseases of the circulatory system: Secondary | ICD-10-CM

## 2022-04-29 DIAGNOSIS — R6520 Severe sepsis without septic shock: Secondary | ICD-10-CM | POA: Diagnosis not present

## 2022-04-29 DIAGNOSIS — I959 Hypotension, unspecified: Secondary | ICD-10-CM | POA: Diagnosis present

## 2022-04-29 DIAGNOSIS — R000 Tachycardia, unspecified: Secondary | ICD-10-CM | POA: Diagnosis present

## 2022-04-29 DIAGNOSIS — I700 Atherosclerosis of aorta: Secondary | ICD-10-CM | POA: Diagnosis not present

## 2022-04-29 DIAGNOSIS — S0240EA Zygomatic fracture, right side, initial encounter for closed fracture: Secondary | ICD-10-CM | POA: Diagnosis not present

## 2022-04-29 DIAGNOSIS — Z7901 Long term (current) use of anticoagulants: Secondary | ICD-10-CM

## 2022-04-29 DIAGNOSIS — C159 Malignant neoplasm of esophagus, unspecified: Secondary | ICD-10-CM | POA: Diagnosis not present

## 2022-04-29 DIAGNOSIS — K863 Pseudocyst of pancreas: Secondary | ICD-10-CM | POA: Diagnosis not present

## 2022-04-29 DIAGNOSIS — A498 Other bacterial infections of unspecified site: Secondary | ICD-10-CM

## 2022-04-29 DIAGNOSIS — S0240CA Maxillary fracture, right side, initial encounter for closed fracture: Secondary | ICD-10-CM | POA: Diagnosis not present

## 2022-04-29 DIAGNOSIS — I2510 Atherosclerotic heart disease of native coronary artery without angina pectoris: Secondary | ICD-10-CM | POA: Diagnosis present

## 2022-04-29 DIAGNOSIS — Y92009 Unspecified place in unspecified non-institutional (private) residence as the place of occurrence of the external cause: Secondary | ICD-10-CM

## 2022-04-29 DIAGNOSIS — I499 Cardiac arrhythmia, unspecified: Secondary | ICD-10-CM | POA: Diagnosis not present

## 2022-04-29 DIAGNOSIS — I739 Peripheral vascular disease, unspecified: Secondary | ICD-10-CM | POA: Diagnosis present

## 2022-04-29 DIAGNOSIS — G9341 Metabolic encephalopathy: Secondary | ICD-10-CM | POA: Diagnosis not present

## 2022-04-29 DIAGNOSIS — D696 Thrombocytopenia, unspecified: Secondary | ICD-10-CM | POA: Diagnosis present

## 2022-04-29 DIAGNOSIS — S0292XA Unspecified fracture of facial bones, initial encounter for closed fracture: Secondary | ICD-10-CM

## 2022-04-29 DIAGNOSIS — Z86711 Personal history of pulmonary embolism: Secondary | ICD-10-CM | POA: Diagnosis present

## 2022-04-29 DIAGNOSIS — K861 Other chronic pancreatitis: Secondary | ICD-10-CM | POA: Diagnosis present

## 2022-04-29 DIAGNOSIS — Z981 Arthrodesis status: Secondary | ICD-10-CM

## 2022-04-29 DIAGNOSIS — Z743 Need for continuous supervision: Secondary | ICD-10-CM | POA: Diagnosis not present

## 2022-04-29 DIAGNOSIS — Z8673 Personal history of transient ischemic attack (TIA), and cerebral infarction without residual deficits: Secondary | ICD-10-CM

## 2022-04-29 DIAGNOSIS — K920 Hematemesis: Secondary | ICD-10-CM | POA: Diagnosis present

## 2022-04-29 DIAGNOSIS — G9340 Encephalopathy, unspecified: Secondary | ICD-10-CM | POA: Diagnosis not present

## 2022-04-29 DIAGNOSIS — G894 Chronic pain syndrome: Secondary | ICD-10-CM | POA: Diagnosis present

## 2022-04-29 DIAGNOSIS — E785 Hyperlipidemia, unspecified: Secondary | ICD-10-CM | POA: Diagnosis present

## 2022-04-29 DIAGNOSIS — S3993XA Unspecified injury of pelvis, initial encounter: Secondary | ICD-10-CM | POA: Diagnosis not present

## 2022-04-29 DIAGNOSIS — A4189 Other specified sepsis: Secondary | ICD-10-CM | POA: Diagnosis not present

## 2022-04-29 DIAGNOSIS — Z89612 Acquired absence of left leg above knee: Secondary | ICD-10-CM

## 2022-04-29 DIAGNOSIS — I252 Old myocardial infarction: Secondary | ICD-10-CM

## 2022-04-29 DIAGNOSIS — S0285XA Fracture of orbit, unspecified, initial encounter for closed fracture: Secondary | ICD-10-CM | POA: Insufficient documentation

## 2022-04-29 DIAGNOSIS — I498 Other specified cardiac arrhythmias: Secondary | ICD-10-CM

## 2022-04-29 DIAGNOSIS — Z043 Encounter for examination and observation following other accident: Secondary | ICD-10-CM | POA: Diagnosis not present

## 2022-04-29 DIAGNOSIS — I50810 Right heart failure, unspecified: Secondary | ICD-10-CM | POA: Diagnosis present

## 2022-04-29 DIAGNOSIS — R6889 Other general symptoms and signs: Secondary | ICD-10-CM | POA: Diagnosis not present

## 2022-04-29 DIAGNOSIS — S299XXA Unspecified injury of thorax, initial encounter: Secondary | ICD-10-CM | POA: Diagnosis not present

## 2022-04-29 DIAGNOSIS — Z79899 Other long term (current) drug therapy: Secondary | ICD-10-CM

## 2022-04-29 DIAGNOSIS — Z885 Allergy status to narcotic agent status: Secondary | ICD-10-CM

## 2022-04-29 DIAGNOSIS — Z9221 Personal history of antineoplastic chemotherapy: Secondary | ICD-10-CM

## 2022-04-29 DIAGNOSIS — R58 Hemorrhage, not elsewhere classified: Secondary | ICD-10-CM | POA: Diagnosis present

## 2022-04-29 DIAGNOSIS — E872 Acidosis, unspecified: Secondary | ICD-10-CM | POA: Diagnosis not present

## 2022-04-29 DIAGNOSIS — C772 Secondary and unspecified malignant neoplasm of intra-abdominal lymph nodes: Secondary | ICD-10-CM | POA: Diagnosis not present

## 2022-04-29 DIAGNOSIS — C155 Malignant neoplasm of lower third of esophagus: Secondary | ICD-10-CM

## 2022-04-29 DIAGNOSIS — T797XXA Traumatic subcutaneous emphysema, initial encounter: Secondary | ICD-10-CM | POA: Diagnosis not present

## 2022-04-29 DIAGNOSIS — Z8616 Personal history of COVID-19: Secondary | ICD-10-CM

## 2022-04-29 DIAGNOSIS — J150 Pneumonia due to Klebsiella pneumoniae: Secondary | ICD-10-CM | POA: Diagnosis not present

## 2022-04-29 DIAGNOSIS — S0003XA Contusion of scalp, initial encounter: Secondary | ICD-10-CM | POA: Diagnosis present

## 2022-04-29 DIAGNOSIS — R404 Transient alteration of awareness: Secondary | ICD-10-CM | POA: Diagnosis not present

## 2022-04-29 DIAGNOSIS — S02841A Fracture of lateral orbital wall, right side, initial encounter for closed fracture: Secondary | ICD-10-CM | POA: Diagnosis not present

## 2022-04-29 DIAGNOSIS — I110 Hypertensive heart disease with heart failure: Secondary | ICD-10-CM | POA: Diagnosis present

## 2022-04-29 DIAGNOSIS — C787 Secondary malignant neoplasm of liver and intrahepatic bile duct: Secondary | ICD-10-CM | POA: Diagnosis not present

## 2022-04-29 DIAGNOSIS — Z0189 Encounter for other specified special examinations: Secondary | ICD-10-CM | POA: Diagnosis not present

## 2022-04-29 DIAGNOSIS — M069 Rheumatoid arthritis, unspecified: Secondary | ICD-10-CM | POA: Diagnosis present

## 2022-04-29 DIAGNOSIS — Z87891 Personal history of nicotine dependence: Secondary | ICD-10-CM

## 2022-04-29 DIAGNOSIS — S0291XA Unspecified fracture of skull, initial encounter for closed fracture: Secondary | ICD-10-CM | POA: Diagnosis present

## 2022-04-29 DIAGNOSIS — R55 Syncope and collapse: Secondary | ICD-10-CM | POA: Diagnosis present

## 2022-04-29 DIAGNOSIS — R7989 Other specified abnormal findings of blood chemistry: Secondary | ICD-10-CM | POA: Insufficient documentation

## 2022-04-29 DIAGNOSIS — R008 Other abnormalities of heart beat: Secondary | ICD-10-CM | POA: Diagnosis present

## 2022-04-29 DIAGNOSIS — D630 Anemia in neoplastic disease: Secondary | ICD-10-CM | POA: Diagnosis present

## 2022-04-29 DIAGNOSIS — R6510 Systemic inflammatory response syndrome (SIRS) of non-infectious origin without acute organ dysfunction: Secondary | ICD-10-CM | POA: Diagnosis present

## 2022-04-29 DIAGNOSIS — W19XXXA Unspecified fall, initial encounter: Secondary | ICD-10-CM | POA: Diagnosis not present

## 2022-04-29 DIAGNOSIS — S0231XA Fracture of orbital floor, right side, initial encounter for closed fracture: Secondary | ICD-10-CM | POA: Diagnosis present

## 2022-04-29 DIAGNOSIS — Z811 Family history of alcohol abuse and dependence: Secondary | ICD-10-CM

## 2022-04-29 DIAGNOSIS — R0902 Hypoxemia: Secondary | ICD-10-CM | POA: Diagnosis not present

## 2022-04-29 DIAGNOSIS — Z7902 Long term (current) use of antithrombotics/antiplatelets: Secondary | ICD-10-CM

## 2022-04-29 DIAGNOSIS — I10 Essential (primary) hypertension: Secondary | ICD-10-CM | POA: Diagnosis not present

## 2022-04-29 DIAGNOSIS — W050XXA Fall from non-moving wheelchair, initial encounter: Secondary | ICD-10-CM | POA: Diagnosis present

## 2022-04-29 DIAGNOSIS — Z888 Allergy status to other drugs, medicaments and biological substances status: Secondary | ICD-10-CM

## 2022-04-29 DIAGNOSIS — Z86718 Personal history of other venous thrombosis and embolism: Secondary | ICD-10-CM

## 2022-04-29 HISTORY — DX: Cerebral infarction, unspecified: I63.9

## 2022-04-29 LAB — COMPREHENSIVE METABOLIC PANEL
ALT: 98 U/L — ABNORMAL HIGH (ref 0–44)
AST: 154 U/L — ABNORMAL HIGH (ref 15–41)
Albumin: 3.1 g/dL — ABNORMAL LOW (ref 3.5–5.0)
Alkaline Phosphatase: 455 U/L — ABNORMAL HIGH (ref 38–126)
Anion gap: 13 (ref 5–15)
BUN: 17 mg/dL (ref 8–23)
CO2: 21 mmol/L — ABNORMAL LOW (ref 22–32)
Calcium: 9 mg/dL (ref 8.9–10.3)
Chloride: 106 mmol/L (ref 98–111)
Creatinine, Ser: 1.06 mg/dL (ref 0.61–1.24)
GFR, Estimated: >60 mL/min (ref >60)
Glucose, Bld: 87 mg/dL (ref 70–99)
Potassium: 4.6 mmol/L (ref 3.5–5.1)
Sodium: 140 mmol/L (ref 135–145)
Total Bilirubin: 0.8 mg/dL (ref 0.3–1.2)
Total Protein: 7.5 g/dL (ref 6.5–8.1)

## 2022-04-29 LAB — TROPONIN I (HIGH SENSITIVITY): Troponin I (High Sensitivity): 37 ng/L — ABNORMAL HIGH (ref <18)

## 2022-04-29 LAB — SAMPLE TO BLOOD BANK

## 2022-04-29 LAB — I-STAT CHEM 8, ED
BUN: 19 mg/dL (ref 8–23)
Calcium, Ion: 1.08 mmol/L — ABNORMAL LOW (ref 1.15–1.40)
Chloride: 109 mmol/L (ref 98–111)
Creatinine, Ser: 1 mg/dL (ref 0.61–1.24)
Glucose, Bld: 85 mg/dL (ref 70–99)
HCT: 41 % (ref 39.0–52.0)
Hemoglobin: 13.9 g/dL (ref 13.0–17.0)
Potassium: 4.7 mmol/L (ref 3.5–5.1)
Sodium: 141 mmol/L (ref 135–145)
TCO2: 22 mmol/L (ref 22–32)

## 2022-04-29 LAB — CBC
HCT: 42 % (ref 39.0–52.0)
Hemoglobin: 12.4 g/dL — ABNORMAL LOW (ref 13.0–17.0)
MCH: 25.9 pg — ABNORMAL LOW (ref 26.0–34.0)
MCHC: 29.5 g/dL — ABNORMAL LOW (ref 30.0–36.0)
MCV: 87.9 fL (ref 80.0–100.0)
Platelets: 204 10*3/uL (ref 150–400)
RBC: 4.78 MIL/uL (ref 4.22–5.81)
RDW: 19.7 % — ABNORMAL HIGH (ref 11.5–15.5)
WBC: 6.2 10*3/uL (ref 4.0–10.5)
nRBC: 0 % (ref 0.0–0.2)

## 2022-04-29 LAB — LACTIC ACID, PLASMA: Lactic Acid, Venous: 3.1 mmol/L (ref 0.5–1.9)

## 2022-04-29 LAB — AMMONIA: Ammonia: 21 umol/L (ref 9–35)

## 2022-04-29 LAB — PROTIME-INR
INR: 1.3 — ABNORMAL HIGH (ref 0.8–1.2)
Prothrombin Time: 16.4 seconds — ABNORMAL HIGH (ref 11.4–15.2)

## 2022-04-29 LAB — SALICYLATE LEVEL: Salicylate Lvl: <7 mg/dL — ABNORMAL LOW (ref 7.0–30.0)

## 2022-04-29 LAB — ACETAMINOPHEN LEVEL: Acetaminophen (Tylenol), Serum: <10 ug/mL — ABNORMAL LOW (ref 10–30)

## 2022-04-29 LAB — ETHANOL: Alcohol, Ethyl (B): <10 mg/dL (ref <10)

## 2022-04-29 NOTE — ED Notes (Signed)
Patient transported to CT 

## 2022-04-29 NOTE — ED Provider Notes (Signed)
[facility] EMERGENCY DEPARTMENT Provider Note   CSN: [id number] Arrival date & time: 04/29/22  2059     History  Chief Complaint  Patient presents with   [name] [name] is a 61 y.o. male with esophageal cancer, history of left AKA, history of anoxic brain injury, history of CVA, extensive CAD including CABG 2020 incomplete proximal RCA obstruction identified in 02/12/2022, history of cardiac arrest, chronic bilateral PEs on apixaban, COPD, status post lumbar fusion, HTN, HLD, RA, tobacco use disorder, presents with fall.  Patient unable to provide much history.  History largely provided by EMS.  EMS states that patient fell approximately 2 hours prior to arrival and was unable to get himself up off of the floor.  He was sitting in his wheelchair when EMS arrived on scene.  Patient's caregiver states that they were not present but they did report that he had complained of chest pain earlier in the day.  EMS states that they had told him that his left upper extremity contracture is at baseline for him.  On arrival to the emergency department patient has right facial swelling and is in a c-collar.  He is mildly tachycardic and is unable to provide any history.  He states that he does not have any chest pain and currently and does not feel short of breath.  He denies headache, neck pain.  When asked if he fell, patient states he is not sure.  Patient is unsure if he lost consciousness.  Patient states that his left upper extremity contracture is at baseline for him.  When asked where he is [place] Per chart review patient was admitted from 10/15 to 04/11/2022 for an NSTEMI.   Fall       Home Medications Prior to Admission medications   Medication Sig Start Date End Date Taking? Authorizing Provider  acetaminophen (TYLENOL) 500 MG tablet Take 1,000 mg by mouth every 6 (six) hours as needed for mild pain.    [provider]   apixaban (ELIQUIS) 5 MG TABS tablet Take 1 tablet (5 mg total) by mouth 2 (two) times daily. 02/28/22   Mapp, Claudia Desanctis, MD  atorvastatin (LIPITOR) 40 MG tablet Take 1 tablet (40 mg total) by mouth daily at 6 PM. 04/11/22   Gaylan Gerold, DO  clopidogrel (PLAVIX) 75 MG tablet Take 1 tablet (75 mg total) by mouth daily. 04/11/22 07/10/22  Serita Butcher, MD  diclofenac Sodium (VOLTAREN) 1 % GEL Apply 2-4 g topically in the morning, at noon, in the evening, and at bedtime. 12/26/21   [provider]  DULoxetine (CYMBALTA) 30 MG capsule Take 30 mg by mouth daily as needed (depression). 12/26/21   [provider]  feeding supplement (ENSURE ENLIVE / ENSURE PLUS) LIQD Take 237 mLs by mouth 2 (two) times daily between meals. 03/18/22   Terrilee Croak, MD  gabapentin (NEURONTIN) 100 MG capsule Take 100 mg by mouth at bedtime as needed (for pain).  12/31/19   [provider]  isosorbide mononitrate (IMDUR) 30 MG 24 hr tablet Take 1 tablet (30 mg total) by mouth daily. 04/12/22   Gaylan Gerold, DO  lidocaine-prilocaine (EMLA) cream Apply 1 Application topically as needed. 03/03/22   Owens Shark, NP  metoprolol succinate (TOPROL-XL) 25 MG 24 hr tablet Take 1 tablet (25 mg total) by mouth daily. 04/11/22 05/11/22  Gaylan Gerold, DO  ondansetron (ZOFRAN) 8 MG tablet TAKE 1 TABLET(8 MG) BY MOUTH  EVERY 8 HOURS AS NEEDED FOR NAUSEA OR VOMITING Patient taking differently: Take 8 mg by mouth every 8 (eight) hours as needed for nausea or vomiting. 04/05/22   Ladell Pier, MD  oxyCODONE-acetaminophen (PERCOCET) 10-325 MG tablet Take 1 tablet by mouth every 6 (six) hours as needed for pain. 04/28/22 05/28/22  Virl Axe, MD  prochlorperazine (COMPAZINE) 10 MG tablet TAKE 1 TABLET(10 MG) BY MOUTH EVERY 6 HOURS AS NEEDED FOR NAUSEA OR VOMITING Patient taking differently: Take 10 mg by mouth every 6 (six) hours as needed for vomiting or nausea. 04/05/22   Ladell Pier, MD      Allergies    Dilaudid  [hydromorphone hcl], Ramipril, and Tramadol    Review of Systems   Review of Systems  Unable to perform ROS: Mental status change   Physical Exam Updated Vital Signs BP (!) 102/55   Pulse (!) 116   Temp (!) 97.4 F (36.3 C) (Temporal)   Resp 14   SpO2 100%  Physical Exam General: Elderly-appearing male, lying in bed.  HEENT: PERRLA, Sclera anicteric, MMM, trachea midline. TTP to right maxilla, periorbital region with surrounding edema. Stable forehead, midface, nasal bridge with no septal hematoma.  EOMI. No midline C-spine tenderness to palpation, no stepoffs.  Cardiology: RRR, no murmurs/rubs/gallops. BL radial and DP pulses equal bilaterally. No chest wall tenderness to palpation.  Resp: Normal respiratory rate and effort. CTAB, no wheezes, rhonchi, crackles.  Abd: Soft, non-tender, non-distended. No rebound tenderness or guarding.  GU: No blood at urethral meatus.  MSK: No peripheral edema or signs of trauma. Extremities without deformity or TTP. No cyanosis or clubbing. Skin: warm, dry. No rashes or lesions. Back: No t or L spine tenderness or stepoffs.  Neuro: A&Ox1, CNs II-XII grossly intact. MAEs. Sensation grossly intact.  Psych: Normal mood and affect.   ED Results / Procedures / Treatments   Labs (all labs ordered are listed, but only abnormal results are displayed) Labs Reviewed  COMPREHENSIVE METABOLIC PANEL - Abnormal; Notable for the following components:      Result Value   CO2 21 (*)    Albumin 3.1 (*)    AST 154 (*)    ALT 98 (*)    Alkaline Phosphatase 455 (*)    All other components within normal limits  CBC - Abnormal; Notable for the following components:   Hemoglobin 12.4 (*)    MCH 25.9 (*)    MCHC 29.5 (*)    RDW 19.7 (*)    All other components within normal limits  LACTIC ACID, PLASMA - Abnormal; Notable for the following components:   Lactic Acid, Venous 3.1 (*)    All other components within normal limits  PROTIME-INR - Abnormal; Notable  for the following components:   Prothrombin Time 16.4 (*)    INR 1.3 (*)    All other components within normal limits  SALICYLATE LEVEL - Abnormal; Notable for the following components:   Salicylate Lvl <1.8 (*)    All other components within normal limits  ACETAMINOPHEN LEVEL - Abnormal; Notable for the following components:   Acetaminophen (Tylenol), Serum <10 (*)    All other components within normal limits  I-STAT CHEM 8, ED - Abnormal; Notable for the following components:   Calcium, Ion 1.08 (*)    All other components within normal limits  TROPONIN I (HIGH SENSITIVITY) - Abnormal; Notable for the following components:   Troponin I (High Sensitivity) 37 (*)    All other components within  normal limits  ETHANOL  AMMONIA  URINALYSIS, ROUTINE W REFLEX MICROSCOPIC  SAMPLE TO BLOOD BANK  TROPONIN I (HIGH SENSITIVITY)    EKG EKG Interpretation  Date/Time:  Saturday April 29 2022 22:43:34 EST Ventricular Rate:  129 PR Interval:  137 QRS Duration: 98 QT Interval:  351 QTC Calculation: 439 R Axis:   151 Text Interpretation: Sinus tachycardia Ventricular bigeminy Inferior infarct, old Confirmed by [place] [phone]) on 04/30/2022 11:48:27 AM  Radiology CT Maxillofacial Wo Contrast  Result Date: 04/29/2022 CLINICAL DATA:  Fall today with blunt facial trauma. EXAM: CT MAXILLOFACIAL WITHOUT CONTRAST TECHNIQUE: Multidetector CT imaging of the maxillofacial structures was performed. Multiplanar CT image reconstructions were also generated. RADIATION DOSE REDUCTION: This exam was performed according to the departmental dose-optimization program which includes automated exposure control, adjustment of the mA and/or kV according to patient size and/or use of iterative reconstruction technique. COMPARISON:  Head CT earlier today. FINDINGS: Osseous: Acute nondisplaced fracture of the right zygomatic arch. No acute fracture of the nasal bones or mandibles. In intact left zygomatic arch.  Patient is edentulous. The temporomandibular joints are congruent. Patient is edentulous. Intact pterygoid plates. Orbits: Acute right orbital fractures involve the inferior and lateral orbital walls. Lateral wall fracture is comminuted and displaced. The inferior wall fracture is mildly displaced. No evidence of globe injury. No extraocular muscle entrapment. Intact left orbit and globe. Sinuses: Comminuted and mildly displaced fractures of the right maxillary sinus, involving anterior, lateral, superior and medial walls. Right maxillary hemosinus. Mucosal thickening throughout right ethmoid air cells. Soft tissues: Right cheek soft tissue edema. Right supraorbital soft tissue hematoma. Small amount of subcutaneous emphysema related to maxillary sinus fracture Limited intracranial: Assessed on head CT earlier today. No hemorrhage. IMPRESSION: 1. Acute right orbital fractures involve the inferior and lateral orbital walls. Lateral wall fracture is comminuted and displaced. The inferior wall fracture is mildly displaced. No evidence of globe injury or extraocular muscle entrapment. 2. Comminuted and mildly displaced fractures of the right maxillary sinus, involving anterior, lateral, superior and medial walls. Right maxillary hemosinus. 3. Acute nondisplaced fracture of the right zygomatic arch. Electronically Signed   By: Keith Rake M.D.   On: 04/29/2022 23:09   CT HEAD WO CONTRAST  Result Date: 04/29/2022 CLINICAL DATA:  Head trauma after fall EXAM: CT HEAD WITHOUT CONTRAST CT CERVICAL SPINE WITHOUT CONTRAST TECHNIQUE: Multidetector CT imaging of the head and cervical spine was performed following the standard protocol without intravenous contrast. Multiplanar CT image reconstructions of the cervical spine were also generated. RADIATION DOSE REDUCTION: This exam was performed according to the departmental dose-optimization program which includes automated exposure control, adjustment of the mA and/or kV  according to patient size and/or use of iterative reconstruction technique. COMPARISON:  CT head 03/11/2022 FINDINGS: CT HEAD FINDINGS Brain: No intracranial hemorrhage, mass effect, or evidence of acute infarct. No hydrocephalus. No extra-axial fluid collection. Generalized cerebral atrophy. Ill-defined hypoattenuation within the cerebral white matter is nonspecific but consistent with chronic small vessel ischemic disease. Right cerebellar chronic infarcts. Vascular: No hyperdense vessel. Intracranial arterial calcification. Skull: No skull fracture. Sinuses/Orbits: Acute mildly displaced fractures of the right lateral and inferior orbital walls. No definite entrapment. Acute mildly displaced comminuted fractures of the anterior and posterior right maxillary sinus walls. Frothy fluid with air-fluid level in the right maxillary sinus likely due to hemosinus. The globes are intact. Other: Large soft tissue hematoma lateral to the right orbit. CT CERVICAL SPINE FINDINGS Alignment: Normal. Skull base and vertebrae: No  acute fracture. No primary bone lesion or focal pathologic process. Soft tissues and spinal canal: No prevertebral fluid or swelling. No visible canal hematoma. Disc levels: Multilevel spondylosis and disc space height loss greatest at C5-C7 where it is moderate. Posterior disc osteophyte complexes at C5-C6 and C6-C7 cause mild effacement of the ventral thecal sac. No high-grade spinal canal narrowing. Uncovertebral spurring and facet arthropathy cause advanced neural foraminal narrowing bilaterally at C5-C6 at C6-C7. Upper chest: No acute abnormality. Other: Carotid bulb calcifications. IMPRESSION: 1. Acute comminuted displaced fractures of the right inferior and lateral orbital walls and anterior and posterior right maxillary sinus walls. The inferior aspect of the maxillary sinuses is not included in the exam. Consider maxillofacial CT for further evaluation. 2. No acute intracranial hemorrhage. 3. No  cervical spine fracture. Electronically Signed   By: Placido Sou M.D.   On: 04/29/2022 22:00   CT CERVICAL SPINE WO CONTRAST  Result Date: 04/29/2022 CLINICAL DATA:  Head trauma after fall EXAM: CT HEAD WITHOUT CONTRAST CT CERVICAL SPINE WITHOUT CONTRAST TECHNIQUE: Multidetector CT imaging of the head and cervical spine was performed following the standard protocol without intravenous contrast. Multiplanar CT image reconstructions of the cervical spine were also generated. RADIATION DOSE REDUCTION: This exam was performed according to the departmental dose-optimization program which includes automated exposure control, adjustment of the mA and/or kV according to patient size and/or use of iterative reconstruction technique. COMPARISON:  CT head 03/11/2022 FINDINGS: CT HEAD FINDINGS Brain: No intracranial hemorrhage, mass effect, or evidence of acute infarct. No hydrocephalus. No extra-axial fluid collection. Generalized cerebral atrophy. Ill-defined hypoattenuation within the cerebral white matter is nonspecific but consistent with chronic small vessel ischemic disease. Right cerebellar chronic infarcts. Vascular: No hyperdense vessel. Intracranial arterial calcification. Skull: No skull fracture. Sinuses/Orbits: Acute mildly displaced fractures of the right lateral and inferior orbital walls. No definite entrapment. Acute mildly displaced comminuted fractures of the anterior and posterior right maxillary sinus walls. Frothy fluid with air-fluid level in the right maxillary sinus likely due to hemosinus. The globes are intact. Other: Large soft tissue hematoma lateral to the right orbit. CT CERVICAL SPINE FINDINGS Alignment: Normal. Skull base and vertebrae: No acute fracture. No primary bone lesion or focal pathologic process. Soft tissues and spinal canal: No prevertebral fluid or swelling. No visible canal hematoma. Disc levels: Multilevel spondylosis and disc space height loss greatest at C5-C7 where it  is moderate. Posterior disc osteophyte complexes at C5-C6 and C6-C7 cause mild effacement of the ventral thecal sac. No high-grade spinal canal narrowing. Uncovertebral spurring and facet arthropathy cause advanced neural foraminal narrowing bilaterally at C5-C6 at C6-C7. Upper chest: No acute abnormality. Other: Carotid bulb calcifications. IMPRESSION: 1. Acute comminuted displaced fractures of the right inferior and lateral orbital walls and anterior and posterior right maxillary sinus walls. The inferior aspect of the maxillary sinuses is not included in the exam. Consider maxillofacial CT for further evaluation. 2. No acute intracranial hemorrhage. 3. No cervical spine fracture. Electronically Signed   By: Placido Sou M.D.   On: 04/29/2022 22:00   DG Pelvis Portable  Result Date: 04/29/2022 CLINICAL DATA:  Trauma EXAM: PORTABLE PELVIS 1-2 VIEWS COMPARISON:  None Available. FINDINGS: No fracture or dislocation is seen. Bilateral hip joint spaces are preserved. Visualized bony pelvis appears intact. L5-S1 fixation hardware. IMPRESSION: Negative. Electronically Signed   By: Julian Hy M.D.   On: 04/29/2022 21:50   DG Chest Port 1 View  Result Date: 04/29/2022 CLINICAL DATA:  Trauma EXAM: PORTABLE  CHEST 1 VIEW COMPARISON:  04/09/2022 FINDINGS: Lungs are clear.  No pleural effusion or pneumothorax. The heart is top-normal in size.  Thoracic aortic atherosclerosis. Right chest power port terminates at the cavoatrial junction. IMPRESSION: No evidence of acute cardiopulmonary disease. Electronically Signed   By: Julian Hy M.D.   On: 04/29/2022 21:50    Procedures Procedures    Medications Ordered in ED Medications  lactated ringers bolus 500 mL (has no administration in time range)    ED Course/ Medical Decision Making/ A&P                          Medical Decision Making Amount and/or Complexity of Data Reviewed Labs: ordered. Decision-making details documented in ED  Course. Radiology: ordered. Decision-making details documented in ED Course.  Risk Decision regarding hospitalization.   Patient is afebrile, mildly hypertensive, afebrile. He is alert but A&Ox1, not to situation, time or place. Per chart review, he is not normally this confused.  Ddx of acute altered mental status or encephalopathy considered but not limited to: -Intracranial abnormalities such as ICH, hydrocephalus, head trauma -patient did fall today and has right periorbital contusion with an abrasion and swelling.  Will evaluate with CT brain and C-spine for head trauma. -Infection such as UTI, PNA, or meningitis -patient is currently afebrile and will evaluate with urine and chest x-ray.  If no other obvious cause of his altered mental status is found, may consider an LP to evaluate for meningitis as an inpatient -Toxic ingestion such as opioid overdose, anticholinergic toxicity -we will evaluate with tox screen but no report of toxic ingestion -Electrolyte abnormalities or hyper/hypoglycemia -Hypercarbia or hypoxia -Hepatic encephalopathy or uremia -ACS or arrhythmia -Endocrine abnormality such as thyroid storm or myxedema coma   Patient is mildly tachycardic w/ EKG demonstrates ventricular bigeminy, which is not demonstrated on prior EKGs. Will d/w cardiology.  Regarding his esophageal cancer, per chart review from discharge summary on 10/17: [facility] outpatient care from oncologist Dr Learta Codding. Multiple masses have been identified in the liver, retroperitoneal lymph nodes, and esophagogastric junction. Labs notable for AFP 2.4, CA 19.9, and CEA 287. A PET scan performed 02-2022 revealed hypermetabolic masses in the distal esophagus, liver, periportal lymph nodes, and adrenal glands. Chemotherapy with FolFOx, trastuzumab, and pembrolizumab was initiated on 10-14, possibly a contributing factor to his recent MI.     I have personally reviewed and interpreted all labs and imaging.    Clinical Course as of 04/30/22 0022  Sat Apr 29, 2022  2217 CT HEAD WO CONTRAST 1. Acute comminuted displaced fractures of the right inferior and lateral orbital walls and anterior and posterior right maxillary sinus walls. The inferior aspect of the maxillary sinuses is not included in the exam. Consider maxillofacial CT for further evaluation. 2. No acute intracranial hemorrhage. 3. No cervical spine fracture.  [HN]  2217 DG Pelvis Portable Negative. [HN]  2217 DG Chest Port 1 View No evidence of acute cardiopulmonary disease. [HN]  2217 WBC: 6.2 [HN]  2217 Hemoglobin(!): 12.4 [HN]  2243 Lactic Acid, Venous(!!): 3.1 [HN]  2344 D/w cardiology who stated that patient does not need acute intervention or treatment for ventricular bigeminy [HN]  2344 AST(!): 154 [HN]  2344 ALT(!): 98 Increased LFTs [HN]  2345 CT Maxillofacial Wo Contrast 1. Acute right orbital fractures involve the inferior and lateral orbital walls. Lateral wall fracture is comminuted and displaced. The inferior wall fracture is mildly displaced. No evidence of globe  injury or extraocular muscle entrapment. 2. Comminuted and mildly displaced fractures of the right maxillary sinus, involving anterior, lateral, superior and medial walls. Right maxillary hemosinus. 3. Acute nondisplaced fracture of the right zygomatic arch. [HN]    Clinical Course User Index [HN] Audley Hose, MD   Patient will need to be admitted for altered mental status. He has new mildly elevated transaminases, alk phos increased from prior, new ventricular bigeminy. No electrolyte abnormalities, leukocytosis, or significant anemia. Trop 37. Ammonia 21. Neg tylenol/salicylate/EtOH, glucose 83.Lactic acid 3.1, giving fluid. No clear etiology for encephalopathy.   D/w hospitalist who would like [place] to discuss with ENT prior to admission to discuss plan for facial fractures listed above.   Dispo: Admit         Final Clinical  Impression(s) / ED Diagnoses Final diagnoses:  Fall, initial encounter  Ventricular bigeminy  Closed fracture of right orbit, initial encounter ([place])  Closed fracture of right side of maxilla, initial encounter [facility])    Rx / DC Orders ED Discharge Orders     None        This note was created using dictation software, which may contain spelling or grammatical errors.    Audley Hose, MD 05/09/22 [phone]

## 2022-04-29 NOTE — Progress Notes (Signed)
   04/29/22 2100  Clinical Encounter Type  Visited With Patient  Visit Type Initial;ED;Trauma  Referral From Nurse   [facility] responded to Level II trauma page in ED; after medical team finished initial workup, [place] introduced himself to pt.  Pt. initially responded to [facility] voice but had difficulty answering CH's questions re: whether he would like assistance contacting family/support person.  Pt. to be transported to CT --> CH remains available as needed.

## 2022-04-29 NOTE — ED Triage Notes (Signed)
Pt arrives via [facility] from home this fall about 2 hours ago, able to get himself up off the floor. Pt was sitting in a wheelchair on arrival to scene. Pts normal  caregiver was not present, they did report pt was c/o chest discomfort earlier. En route, pt had bigeminy and trigeminy. LUE is normally contracture. Lungs were clear, 160/78, hr 112, cbg 155. Plavix and Eliquis.

## 2022-04-30 ENCOUNTER — Other Ambulatory Visit: Payer: Self-pay

## 2022-04-30 DIAGNOSIS — D630 Anemia in neoplastic disease: Secondary | ICD-10-CM | POA: Diagnosis present

## 2022-04-30 DIAGNOSIS — J150 Pneumonia due to Klebsiella pneumoniae: Secondary | ICD-10-CM | POA: Diagnosis not present

## 2022-04-30 DIAGNOSIS — S0292XA Unspecified fracture of facial bones, initial encounter for closed fracture: Secondary | ICD-10-CM

## 2022-04-30 DIAGNOSIS — I6381 Other cerebral infarction due to occlusion or stenosis of small artery: Secondary | ICD-10-CM | POA: Diagnosis not present

## 2022-04-30 DIAGNOSIS — I959 Hypotension, unspecified: Secondary | ICD-10-CM | POA: Diagnosis not present

## 2022-04-30 DIAGNOSIS — G9340 Encephalopathy, unspecified: Secondary | ICD-10-CM | POA: Diagnosis not present

## 2022-04-30 DIAGNOSIS — I110 Hypertensive heart disease with heart failure: Secondary | ICD-10-CM | POA: Diagnosis present

## 2022-04-30 DIAGNOSIS — I252 Old myocardial infarction: Secondary | ICD-10-CM | POA: Diagnosis not present

## 2022-04-30 DIAGNOSIS — R0682 Tachypnea, not elsewhere classified: Secondary | ICD-10-CM | POA: Diagnosis not present

## 2022-04-30 DIAGNOSIS — R7989 Other specified abnormal findings of blood chemistry: Secondary | ICD-10-CM | POA: Insufficient documentation

## 2022-04-30 DIAGNOSIS — Z8616 Personal history of COVID-19: Secondary | ICD-10-CM | POA: Diagnosis not present

## 2022-04-30 DIAGNOSIS — S0231XA Fracture of orbital floor, right side, initial encounter for closed fracture: Secondary | ICD-10-CM | POA: Diagnosis not present

## 2022-04-30 DIAGNOSIS — R6520 Severe sepsis without septic shock: Secondary | ICD-10-CM | POA: Diagnosis present

## 2022-04-30 DIAGNOSIS — C772 Secondary and unspecified malignant neoplasm of intra-abdominal lymph nodes: Secondary | ICD-10-CM | POA: Diagnosis not present

## 2022-04-30 DIAGNOSIS — C787 Secondary malignant neoplasm of liver and intrahepatic bile duct: Secondary | ICD-10-CM | POA: Diagnosis not present

## 2022-04-30 DIAGNOSIS — W050XXA Fall from non-moving wheelchair, initial encounter: Secondary | ICD-10-CM | POA: Diagnosis present

## 2022-04-30 DIAGNOSIS — Z0189 Encounter for other specified special examinations: Secondary | ICD-10-CM | POA: Diagnosis not present

## 2022-04-30 DIAGNOSIS — W19XXXA Unspecified fall, initial encounter: Secondary | ICD-10-CM

## 2022-04-30 DIAGNOSIS — E872 Acidosis, unspecified: Secondary | ICD-10-CM | POA: Diagnosis not present

## 2022-04-30 DIAGNOSIS — D696 Thrombocytopenia, unspecified: Secondary | ICD-10-CM | POA: Diagnosis not present

## 2022-04-30 DIAGNOSIS — S0291XA Unspecified fracture of skull, initial encounter for closed fracture: Secondary | ICD-10-CM | POA: Diagnosis present

## 2022-04-30 DIAGNOSIS — Z87891 Personal history of nicotine dependence: Secondary | ICD-10-CM | POA: Diagnosis not present

## 2022-04-30 DIAGNOSIS — K863 Pseudocyst of pancreas: Secondary | ICD-10-CM | POA: Diagnosis not present

## 2022-04-30 DIAGNOSIS — C159 Malignant neoplasm of esophagus, unspecified: Secondary | ICD-10-CM | POA: Diagnosis not present

## 2022-04-30 DIAGNOSIS — I739 Peripheral vascular disease, unspecified: Secondary | ICD-10-CM | POA: Diagnosis present

## 2022-04-30 DIAGNOSIS — K920 Hematemesis: Secondary | ICD-10-CM | POA: Diagnosis present

## 2022-04-30 DIAGNOSIS — S0003XA Contusion of scalp, initial encounter: Secondary | ICD-10-CM | POA: Diagnosis not present

## 2022-04-30 DIAGNOSIS — I50810 Right heart failure, unspecified: Secondary | ICD-10-CM | POA: Diagnosis not present

## 2022-04-30 DIAGNOSIS — A4189 Other specified sepsis: Secondary | ICD-10-CM | POA: Diagnosis not present

## 2022-04-30 DIAGNOSIS — M069 Rheumatoid arthritis, unspecified: Secondary | ICD-10-CM | POA: Diagnosis present

## 2022-04-30 DIAGNOSIS — S0240EA Zygomatic fracture, right side, initial encounter for closed fracture: Secondary | ICD-10-CM | POA: Diagnosis present

## 2022-04-30 DIAGNOSIS — K861 Other chronic pancreatitis: Secondary | ICD-10-CM | POA: Diagnosis present

## 2022-04-30 DIAGNOSIS — A498 Other bacterial infections of unspecified site: Secondary | ICD-10-CM | POA: Diagnosis not present

## 2022-04-30 DIAGNOSIS — I498 Other specified cardiac arrhythmias: Secondary | ICD-10-CM | POA: Diagnosis not present

## 2022-04-30 DIAGNOSIS — S02841A Fracture of lateral orbital wall, right side, initial encounter for closed fracture: Secondary | ICD-10-CM | POA: Diagnosis not present

## 2022-04-30 DIAGNOSIS — S0240CA Maxillary fracture, right side, initial encounter for closed fracture: Secondary | ICD-10-CM | POA: Diagnosis present

## 2022-04-30 DIAGNOSIS — I10 Essential (primary) hypertension: Secondary | ICD-10-CM

## 2022-04-30 DIAGNOSIS — Z951 Presence of aortocoronary bypass graft: Secondary | ICD-10-CM

## 2022-04-30 DIAGNOSIS — S0285XA Fracture of orbit, unspecified, initial encounter for closed fracture: Secondary | ICD-10-CM | POA: Insufficient documentation

## 2022-04-30 DIAGNOSIS — Y92009 Unspecified place in unspecified non-institutional (private) residence as the place of occurrence of the external cause: Secondary | ICD-10-CM | POA: Diagnosis not present

## 2022-04-30 DIAGNOSIS — G9341 Metabolic encephalopathy: Secondary | ICD-10-CM | POA: Diagnosis not present

## 2022-04-30 DIAGNOSIS — R7881 Bacteremia: Secondary | ICD-10-CM | POA: Diagnosis not present

## 2022-04-30 DIAGNOSIS — A419 Sepsis, unspecified organism: Secondary | ICD-10-CM | POA: Diagnosis not present

## 2022-04-30 DIAGNOSIS — C801 Malignant (primary) neoplasm, unspecified: Secondary | ICD-10-CM | POA: Diagnosis not present

## 2022-04-30 DIAGNOSIS — J449 Chronic obstructive pulmonary disease, unspecified: Secondary | ICD-10-CM | POA: Diagnosis present

## 2022-04-30 DIAGNOSIS — K5730 Diverticulosis of large intestine without perforation or abscess without bleeding: Secondary | ICD-10-CM | POA: Diagnosis not present

## 2022-04-30 LAB — BLOOD CULTURE ID PANEL (REFLEXED) - BCID2

## 2022-04-30 LAB — LACTIC ACID, PLASMA: Lactic Acid, Venous: 1.2 mmol/L (ref 0.5–1.9)

## 2022-04-30 LAB — CBC
HCT: 29.8 % — ABNORMAL LOW (ref 39.0–52.0)
Hemoglobin: 9.3 g/dL — ABNORMAL LOW (ref 13.0–17.0)
MCH: 26.3 pg (ref 26.0–34.0)
MCHC: 31.2 g/dL (ref 30.0–36.0)
MCV: 84.4 fL (ref 80.0–100.0)
Platelets: 169 10*3/uL (ref 150–400)
RBC: 3.53 MIL/uL — ABNORMAL LOW (ref 4.22–5.81)
RDW: 19.7 % — ABNORMAL HIGH (ref 11.5–15.5)
WBC: 5.5 10*3/uL (ref 4.0–10.5)
nRBC: 0 % (ref 0.0–0.2)

## 2022-04-30 LAB — URINALYSIS, COMPLETE (UACMP) WITH MICROSCOPIC
Bacteria, UA: NONE SEEN
Bilirubin Urine: NEGATIVE
Glucose, UA: NEGATIVE mg/dL
Hgb urine dipstick: NEGATIVE
Ketones, ur: NEGATIVE mg/dL
Leukocytes,Ua: NEGATIVE
Nitrite: NEGATIVE
Protein, ur: NEGATIVE mg/dL
Specific Gravity, Urine: 1.017 (ref 1.005–1.030)
pH: 6 (ref 5.0–8.0)

## 2022-04-30 LAB — BASIC METABOLIC PANEL
Anion gap: 8 (ref 5–15)
BUN: 14 mg/dL (ref 8–23)
CO2: 22 mmol/L (ref 22–32)
Calcium: 8.4 mg/dL — ABNORMAL LOW (ref 8.9–10.3)
Chloride: 110 mmol/L (ref 98–111)
Creatinine, Ser: 0.93 mg/dL (ref 0.61–1.24)
GFR, Estimated: >60 mL/min (ref >60)
Glucose, Bld: 83 mg/dL (ref 70–99)
Potassium: 3.8 mmol/L (ref 3.5–5.1)
Sodium: 140 mmol/L (ref 135–145)

## 2022-04-30 LAB — TROPONIN I (HIGH SENSITIVITY): Troponin I (High Sensitivity): 41 ng/L — ABNORMAL HIGH (ref <18)

## 2022-04-30 LAB — RAPID URINE DRUG SCREEN, HOSP PERFORMED
Amphetamines: NOT DETECTED
Barbiturates: NOT DETECTED
Benzodiazepines: NOT DETECTED
Cocaine: NOT DETECTED
Opiates: POSITIVE — AB
Tetrahydrocannabinol: NOT DETECTED

## 2022-04-30 LAB — HEMOGLOBIN AND HEMATOCRIT, BLOOD
HCT: 32 % — ABNORMAL LOW (ref 39.0–52.0)
Hemoglobin: 9.9 g/dL — ABNORMAL LOW (ref 13.0–17.0)

## 2022-04-30 LAB — MAGNESIUM: Magnesium: 1.4 mg/dL — ABNORMAL LOW (ref 1.7–2.4)

## 2022-04-30 LAB — PROCALCITONIN: Procalcitonin: 1.25 ng/mL

## 2022-04-30 LAB — APTT: aPTT: 45 seconds — ABNORMAL HIGH (ref 24–36)

## 2022-04-30 MED ORDER — ATORVASTATIN CALCIUM 40 MG PO TABS
40.0000 mg | ORAL_TABLET | Freq: Every day | ORAL | Status: DC
  Administered 2022-04-30 – 2022-05-03 (×4): 40 mg via ORAL
  Filled 2022-04-30 (×4): qty 1

## 2022-04-30 MED ORDER — APIXABAN 5 MG PO TABS
5.0000 mg | ORAL_TABLET | Freq: Two times a day (BID) | ORAL | Status: DC
  Administered 2022-04-30 (×3): 5 mg via ORAL
  Filled 2022-04-30 (×4): qty 1

## 2022-04-30 MED ORDER — ACETAMINOPHEN 325 MG PO TABS
650.0000 mg | ORAL_TABLET | Freq: Four times a day (QID) | ORAL | Status: DC | PRN
  Administered 2022-04-30 – 2022-05-03 (×7): 650 mg via ORAL
  Filled 2022-04-30 (×7): qty 2

## 2022-04-30 MED ORDER — SODIUM CHLORIDE 0.9 % IV SOLN
2.0000 g | INTRAVENOUS | Status: DC
  Administered 2022-04-30 – 2022-05-01 (×2): 2 g via INTRAVENOUS
  Filled 2022-04-30 (×3): qty 20

## 2022-04-30 MED ORDER — CLOPIDOGREL BISULFATE 75 MG PO TABS
75.0000 mg | ORAL_TABLET | Freq: Every day | ORAL | Status: DC
  Administered 2022-04-30: 75 mg via ORAL
  Filled 2022-04-30 (×2): qty 1

## 2022-04-30 MED ORDER — LACTATED RINGERS IV BOLUS (SEPSIS)
1000.0000 mL | Freq: Once | INTRAVENOUS | Status: AC
  Administered 2022-04-30: 1000 mL via INTRAVENOUS

## 2022-04-30 MED ORDER — OXYCODONE-ACETAMINOPHEN 5-325 MG PO TABS
1.0000 | ORAL_TABLET | Freq: Four times a day (QID) | ORAL | Status: DC | PRN
  Administered 2022-04-30: 1 via ORAL
  Filled 2022-04-30: qty 1

## 2022-04-30 MED ORDER — LACTATED RINGERS IV SOLN: INTRAVENOUS | Status: AC

## 2022-04-30 MED ORDER — ISOSORBIDE MONONITRATE ER 30 MG PO TB24
30.0000 mg | ORAL_TABLET | Freq: Every day | ORAL | Status: DC
  Administered 2022-04-30 – 2022-05-01 (×2): 30 mg via ORAL
  Filled 2022-04-30 (×2): qty 1

## 2022-04-30 MED ORDER — HALOPERIDOL LACTATE 5 MG/ML IJ SOLN
1.0000 mg | Freq: Once | INTRAMUSCULAR | Status: AC
  Administered 2022-04-30: 1 mg via INTRAVENOUS
  Filled 2022-04-30: qty 1

## 2022-04-30 MED ORDER — OXYCODONE-ACETAMINOPHEN 10-325 MG PO TABS: 1.0000 | ORAL_TABLET | Freq: Four times a day (QID) | ORAL | Status: DC | PRN

## 2022-04-30 MED ORDER — SODIUM CHLORIDE 0.9% FLUSH
3.0000 mL | Freq: Two times a day (BID) | INTRAVENOUS | Status: DC
  Administered 2022-04-30 – 2022-05-03 (×6): 3 mL via INTRAVENOUS

## 2022-04-30 MED ORDER — METOPROLOL TARTRATE 5 MG/5ML IV SOLN
5.0000 mg | Freq: Once | INTRAVENOUS | Status: DC
  Filled 2022-04-30: qty 5

## 2022-04-30 MED ORDER — ACETAMINOPHEN 650 MG RE SUPP
650.0000 mg | Freq: Four times a day (QID) | RECTAL | Status: DC | PRN
  Administered 2022-04-30 – 2022-05-01 (×2): 650 mg via RECTAL
  Filled 2022-04-30 (×2): qty 1

## 2022-04-30 MED ORDER — ONDANSETRON HCL 4 MG PO TABS: 4.0000 mg | ORAL_TABLET | Freq: Four times a day (QID) | ORAL | Status: DC | PRN

## 2022-04-30 MED ORDER — ONDANSETRON HCL 4 MG/2ML IJ SOLN
4.0000 mg | Freq: Four times a day (QID) | INTRAMUSCULAR | Status: DC | PRN
  Administered 2022-04-30: 4 mg via INTRAVENOUS
  Filled 2022-04-30: qty 2

## 2022-04-30 MED ORDER — OXYCODONE HCL 5 MG PO TABS
5.0000 mg | ORAL_TABLET | Freq: Four times a day (QID) | ORAL | Status: DC | PRN
  Administered 2022-04-30 (×2): 5 mg via ORAL
  Filled 2022-04-30 (×2): qty 1

## 2022-04-30 MED ORDER — LACTATED RINGERS IV BOLUS
500.0000 mL | Freq: Once | INTRAVENOUS | Status: AC
  Administered 2022-04-30: 500 mL via INTRAVENOUS

## 2022-04-30 MED ORDER — METOPROLOL SUCCINATE ER 25 MG PO TB24
25.0000 mg | ORAL_TABLET | Freq: Every day | ORAL | Status: DC
  Administered 2022-04-30 – 2022-05-04 (×5): 25 mg via ORAL
  Filled 2022-04-30 (×5): qty 1

## 2022-04-30 MED ORDER — DULOXETINE HCL 30 MG PO CPEP: 30.0000 mg | ORAL_CAPSULE | Freq: Every day | ORAL | Status: DC | PRN

## 2022-04-30 NOTE — Assessment & Plan Note (Addendum)
Patient with suspected syncopal episode with ventricular bigeminy Has a history of cardiac arrest in the setting of STEMI 01/2022 Ensure potassium above 4 and magnesium over 2 Continuous cardiac monitoring Metoprolol IV prn Consider cardiology consult in the a.m.

## 2022-04-30 NOTE — ED Notes (Signed)
Received verbal report from [facility] at this time

## 2022-04-30 NOTE — ED Notes (Signed)
Per Dr. Damita Dunnings c-collar can be removed, she will order meds to help relax pt, and she is going to order something for  the heart rhythm and rate

## 2022-04-30 NOTE — Assessment & Plan Note (Signed)
Alk phos in the 400s previously in the 200s, possibly related to malignancy.  Elevation to AST and ALT on new Monitor response to IV hydration Further diagnostic evaluation/imaging might be warranted if not improved With hydration

## 2022-04-30 NOTE — Progress Notes (Addendum)
Patient admitted after midnight.  Please see H&P.  Patient actually was just d/c'd from [facility] on 10/17 and has Dr. Markus Jarvis of [facility] listed as his PCP.  Have called to facilitate a transfer to their service-- they will take over in the AM (Monday).  Currently it is unclear if any consults have been called regarding patient's facial fracture.  I have reached out to the on call ENT (Dr. Fredric Dine) to see if she was consulted (Dr. Leonette Monarch is an actual ER doctor so I do not think he has been consulted to see in the AM as per the H&P)  Eulogio Bear DO  ADDENDUM: 8 AM ENT Dr. Fredric Dine was NOT consulted-- I have messaged the ER PA to clarify who was actually consulted to see the patient since her note has not yet been completed. JV  ADDENDUM 10 AM -Patient seen by Dr. Drema Pry- no need for surgery-- can follow up outpatient [place] regarding temp/BP- getting IVF, urine does not show infection, blood cultures pending, PRN tylenol.  Holding parameters on BP meds

## 2022-04-30 NOTE — ED Notes (Signed)
Pt able to answer his name and date of birth and location. Unable to answer current date and time and situation. Pt is contracted in the lt hand but able to move and squeeze but weak. LAKA.. C/o pain in rt arm and neck. Noted [place]-j collar in place. Rt eye is swelling and discolored with a laceration in the eyebrow.[name] [name]

## 2022-04-30 NOTE — ED Notes (Signed)
Pt is attempting to set up and get out of the bed at this time. Dr. Leonette Monarch at bedside explaining to pt that he needs to stay into bed. This RN at bedside at this time attempting to reeducate pt on staying in bed and laying still and the importance of doing so. Pt has removed the condom cath that was in placed will replace at this time

## 2022-04-30 NOTE — Assessment & Plan Note (Signed)
Elevated troponin Suspect demand ischemia from fall/possible syncope Patient initially had chest pain which resolved on its own Continue to trend troponin Continue atorvastatin, clopidogrel and apixaban

## 2022-04-30 NOTE — Progress Notes (Signed)
PHARMACY - PHYSICIAN COMMUNICATION CRITICAL VALUE ALERT - BLOOD CULTURE IDENTIFICATION (BCID)  [name] [name] is an 61 y.o. male who presented to [facility] on 04/29/2022 with a chief complaint of chest pain following a fall off his wheelchair sustaining facial injury.  Assessment:  WBC WNL, Tmax 100.5. LA 3.1>1.2. UA neg nitrite/leukocytes, no bacteria, WBC 0-5. Bcx 1/4 bottles growing GNR - BCID saying klebsiella pneumonia with no resistance.   Name of physician (or Provider) Contacted: Dr Eliseo Squires  Current antibiotics: None  Changes to prescribed antibiotics recommended:  Adding ceftriaxone 2g IV every 24 hours  Results for orders placed or performed during the hospital encounter of 03/11/22  Blood Culture ID Panel (Reflexed) (Collected: 03/11/2022  7:30 PM)  Result Value Ref Range   Enterococcus faecalis NOT DETECTED NOT DETECTED   Enterococcus Faecium NOT DETECTED NOT DETECTED   Listeria monocytogenes NOT DETECTED NOT DETECTED   Staphylococcus species NOT DETECTED NOT DETECTED   Staphylococcus aureus (BCID) NOT DETECTED NOT DETECTED   Staphylococcus epidermidis NOT DETECTED NOT DETECTED   Staphylococcus lugdunensis NOT DETECTED NOT DETECTED   Streptococcus species NOT DETECTED NOT DETECTED   Streptococcus agalactiae NOT DETECTED NOT DETECTED   Streptococcus pneumoniae NOT DETECTED NOT DETECTED   Streptococcus pyogenes NOT DETECTED NOT DETECTED   A.calcoaceticus-baumannii NOT DETECTED NOT DETECTED   Bacteroides fragilis NOT DETECTED NOT DETECTED   Enterobacterales DETECTED (A) NOT DETECTED   Enterobacter cloacae complex NOT DETECTED NOT DETECTED   Escherichia coli DETECTED (A) NOT DETECTED   Klebsiella aerogenes NOT DETECTED NOT DETECTED   Klebsiella oxytoca NOT DETECTED NOT DETECTED   Klebsiella pneumoniae NOT DETECTED NOT DETECTED   Proteus species NOT DETECTED NOT DETECTED   Salmonella species NOT DETECTED NOT DETECTED   Serratia marcescens NOT DETECTED NOT DETECTED    Haemophilus influenzae NOT DETECTED NOT DETECTED   Neisseria meningitidis NOT DETECTED NOT DETECTED   Pseudomonas aeruginosa NOT DETECTED NOT DETECTED   Stenotrophomonas maltophilia NOT DETECTED NOT DETECTED   Candida albicans NOT DETECTED NOT DETECTED   Candida auris NOT DETECTED NOT DETECTED   Candida glabrata NOT DETECTED NOT DETECTED   Candida krusei NOT DETECTED NOT DETECTED   Candida parapsilosis NOT DETECTED NOT DETECTED   Candida tropicalis NOT DETECTED NOT DETECTED   Cryptococcus neoformans/gattii NOT DETECTED NOT DETECTED   CTX-M ESBL NOT DETECTED NOT DETECTED   Carbapenem resistance IMP NOT DETECTED NOT DETECTED   Carbapenem resistance KPC NOT DETECTED NOT DETECTED   Carbapenem resistance NDM NOT DETECTED NOT DETECTED   Carbapenem resist OXA 48 LIKE NOT DETECTED NOT DETECTED   Carbapenem resistance VIM NOT DETECTED NOT DETECTED    Antonietta Jewel, PharmD, BCCCP Clinical Pharmacist  Phone: [phone] 04/30/2022 3:37 PM  Please check AMION for all [facility] phone numbers After 10:00 PM, call [facility] [phone]

## 2022-04-30 NOTE — H&P (Signed)
History and Physical    Patient: [name] [name] TKW:[id number] DOB: Jan 31, 1961 DOA: 04/29/2022 DOS: the patient was seen and examined on 04/30/2022 PCP: Angelique Blonder, DO  Patient coming from: Home  Chief Complaint:  Chief Complaint  Patient presents with   Fall    HPI: [name] [name] is a 61 y.o. male with medical history significant for metastatic adenocarcinoma (esophageal primary suspected), currently on chemotherapy (initiated on 04/08/2022) , multiple DVT/PE on AC, HTN, HLD, CAD s/p CABG 2020, inferior STEMI 02/2425 complicated by cardiac arrest c/b cardiac arrest 01/2022, who presents to the ED with chest pain following a fall off his wheelchair sustaining facial injuries.  Patient was able to help himself up and get into the wheelchair by arrival of EMS.  History is taken from ED account with stated that caregiver had mentioned earlier that patient had complained of chest pain earlier in the day.  Patient arrived to the ED with facial swelling and in a c-collar.  He was chest pain-free on arrival. ED course and data review:.  BP 148/60, with pulse in the 120s O2 sat 99% on room air and afebrile.Labs significant for normal WBC but with lactic acid 3.1.  Hemoglobin 12.4.  CMP showing elevated LFTs with AST 154, ALT 98 and alk phos 455.  Troponin 37.  Ammonia level 21.  Ethanol, salicylate and acetaminophen levels all undetectable.  EKG, personally reviewed and interpreted showing sinus tachycardia at 120 with ventricular bigeminy and old MI.  Trauma imaging significant for the following, notably orbital wall and facial bone fractures as noted below: IMPRESSION: 1. Acute right orbital fractures involve the inferior and lateral orbital walls. Lateral wall fracture is comminuted and displaced. The inferior wall fracture is mildly displaced. No evidence of globe injury or extraocular muscle entrapment. 2. Comminuted and mildly displaced fractures of the right maxillary sinus, involving anterior,  lateral, superior and medial walls. Right maxillary hemosinus. 3. Acute nondisplaced fracture of the right zygomatic arch. The ED provider spoke with cardiology fellow regarding the ventricular bigeminy and elevated troponin and no acute intervention was recommended.  ED provider also reached out to the Dr. Leonette Monarch who will see patient in the a.m. and recommended monitoring for diplopia and avoiding blowing nose. Patient was treated with 500 mill LR bolus and hospitalist consulted for admission. Soon after admission, patient developed a temperature of 100.5 and blood pressure trended down to 106/64.    Past Medical History:  Diagnosis Date   Abnormal CT scan, gastrointestinal tract    Acute respiratory failure ([place]) 01/31/2022   Acute respiratory failure with hypoxemia (HCC) 02/11/2022   Anemia    Anemia 08/06/2012   Aortic valve mass 12/30/2015   Bilateral shoulder pain 08/20/2012   Bite wound of left hand 09/27/2021   Cardiogenic shock ([place]) 01/31/2022   Community acquired pneumonia 11/07/2011   Critical limb ischemia with history of revascularization of same extremity ([place]) 04/28/2020   DDD (degenerative disc disease), lumbosacral     and grade 2 slip   DVT (deep venous thrombosis) ([place]) 01/31/2022   GERD (gastroesophageal reflux disease)    H/O: stroke 01/31/2022   Heart murmur    History of anemia    no current med.   History of COVID-19 05/10/2019   History of critical lower limb ischemia 02/19/2019   Hyperglycemia 01/31/2022   Hypertension    states is borderline on medication; has been on med. x 5-6 yr.   Hypokalemia 05/30/2018   Impingement syndrome of shoulder region 10/2014  left   Insomnia 06/01/2015   Ischemic ulcer of toe of left foot (HCC)    Lactic acidosis 01/31/2022   Left shoulder pain 12/16/2015   Liver mass    Low back pain without sciatica 10/29/2009   Lupus ([place])    Multiple fractures of ribs, bilateral, due to CPR trauma 01/31/2022   Onychomycosis  01/28/2021   Paronychia of great toe 01/28/2021   Peripheral vascular disease ([place])    Pneumonia    Pressure injury of skin 01/31/2022   Pulmonary contusion 01/31/2022   RA (rheumatoid arthritis) ([place])    Rotator cuff tear 11/04/2014   STEMI (ST elevation myocardial infarction) ([place]) 01/31/2022   Sternal fracture 01/31/2022   Stroke ([place])    Transaminitis 12/16/2015   Past Surgical History:  Procedure Laterality Date   ABDOMINAL AORTAGRAM  02/20/2019   ABDOMINAL AORTOGRAM W/LOWER EXTREMITY N/A 02/20/2019   Procedure: ABDOMINAL AORTOGRAM W/LOWER EXTREMITY;  Surgeon: Marty Heck, MD;  Location: [facility];  Service: Cardiovascular;  Laterality: N/A;   ACHILLES TENDON SURGERY Bilateral    AMPUTATION Left 05/11/2020   Procedure: LEFT ABOVE KNEE AMPUTATION;  Surgeon: Angelia Mould, MD;  Location: [facility];  Service: Vascular;  Laterality: Left;   ENDARTERECTOMY POPLITEAL Left 05/01/2020   Procedure: ENDARTERECTOMY POPLITEAL;  Surgeon: Waynetta Sandy, MD;  Location: [facility];  Service: Vascular;  Laterality: Left;   FASCIOTOMY Left 05/01/2020   Procedure: FASCIOTOMY;  Surgeon: Waynetta Sandy, MD;  Location: [facility];  Service: Vascular;  Laterality: Left;   FEMORAL-POPLITEAL BYPASS GRAFT Left 02/26/2019   Procedure: BYPASS GRAFT FEMORAL-POPLITEAL ARTERY LEFT LEG USING 28m PROPATEN GRAFT;  Surgeon: BSerafina Mitchell MD;  [place] [facility]  Service: Vascular;  Laterality: Left;   INTRAOPERATIVE ARTERIOGRAM Left 01/22/2020   Procedure: INTRA OPERATIVE ARTERIOGRAM with administration of thrombolyics in Left femoral to popliteal bypass.;  Surgeon: CMarty Heck MD;  [facility]  Service: Vascular;  Laterality: Left;   IR IMAGING GUIDED PORT INSERTION  03/07/2022   LEFT HEART CATH AND CORONARY ANGIOGRAPHY N/A 03/15/2017   Procedure: LEFT HEART CATH AND CORONARY ANGIOGRAPHY;  Surgeon: ENelva Bush MD;  [facility];  Service:  Cardiovascular;  Laterality: N/A;   LEFT HEART CATH AND CORONARY ANGIOGRAPHY N/A 02/06/2022   Procedure: LEFT HEART CATH AND CORONARY ANGIOGRAPHY;  Surgeon: JMartinique Peter M, MD;  [facility];  Service: Cardiovascular;  Laterality: N/A;   LOWER EXTREMITY INTERVENTION Left 04/29/2020   Procedure: LOWER EXTREMITY INTERVENTION- LYSIS;  Surgeon: CMarty Heck MD;  [facility];  Service: Cardiovascular;  Laterality: Left;   OSTEOTOMY AND ULNAR SHORTENING Right 07/22/2002   PATCH ANGIOPLASTY Left 05/01/2020   Procedure: PATCH ANGIOPLASTY USING HEMASHIELD PLATINUM FINESSE PATCH;  Surgeon: CWaynetta Sandy MD;  [place] [facility]  Service: Vascular;  Laterality: Left;   PERIPHERAL VASCULAR ATHERECTOMY  01/23/2020   Procedure: PERIPHERAL VASCULAR ATHERECTOMY;  Surgeon: CMarty Heck MD;  [facility];  Service: Cardiovascular;;   PERIPHERAL VASCULAR BALLOON ANGIOPLASTY  01/23/2020   Procedure: PERIPHERAL VASCULAR BALLOON ANGIOPLASTY;  Surgeon: CMarty Heck MD;  [facility];  Service: Cardiovascular;;   PERIPHERAL VASCULAR INTERVENTION Left 04/30/2020   Procedure: PERIPHERAL VASCULAR INTERVENTION;  Surgeon: HCherre Robins MD;  [facility];  Service: Cardiovascular;  Laterality: Left;   PERIPHERAL VASCULAR THROMBECTOMY N/A 01/23/2020   Procedure: lysis recheck;  Surgeon: CMarty Heck MD;  Location: [facility];  Service: Cardiovascular;  Laterality: N/A;  + [place]    PERIPHERAL VASCULAR THROMBECTOMY N/A 04/30/2020   Procedure: Lysis Recheck;  Surgeon: Cherre Robins, MD;  Location: [facility];  Service: Cardiovascular;  Laterality: N/A;   SHOULDER ARTHROSCOPY WITH BICEPSTENOTOMY Right 03/11/2015   Procedure: SHOULDER ARTHROSCOPY WITH BICEPSTENOTOMY;  Surgeon: Ninetta Lights, MD;  Location: [facility];  Service: Orthopedics;  Laterality: Right;   SHOULDER  ARTHROSCOPY WITH DISTAL CLAVICLE RESECTION Left 11/19/2014   Procedure: SHOULDER ARTHROSCOPY WITH DISTAL CLAVICLE RESECTION;  Surgeon: Kathryne Hitch, MD;  Location: [facility];  Service: Orthopedics;  Laterality: Left;   SHOULDER ARTHROSCOPY WITH ROTATOR CUFF REPAIR AND SUBACROMIAL DECOMPRESSION Left 11/19/2014   Procedure: LEFT SHOULDER ARTHROSCOPY, DEBRIDEMENT DISTAL CLAVICLE EXCISION, ACROMIOPLASTY WITH ROTATOR CUFF REPAIR ;  Surgeon: Kathryne Hitch, MD;  Location: [facility];  Service: Orthopedics;  Laterality: Left;   THROMBECTOMY OF BYPASS GRAFT FEMORAL- POPLITEAL ARTERY Left 05/01/2020   Procedure: THROMBECTOMY OF LOWER EXTREMITY;  Surgeon: Waynetta Sandy, MD;  Location: [facility];  Service: Vascular;  Laterality: Left;   TRANSFORAMINAL LUMBAR INTERBODY FUSION (TLIF) WITH PEDICLE SCREW FIXATION 1 LEVEL N/A 01/03/2018   Procedure: TRANSFORAMINAL LUMBAR INTERBODY FUSION (TLIF) LUMBAR FIVE-SACRAL ONE;  Surgeon: Melina Schools, MD;  Location: [facility];  Service: Orthopedics;  Laterality: N/A;   ULTRASOUND GUIDANCE FOR VASCULAR ACCESS Right 01/22/2020   Procedure: ULTRASOUND GUIDANCE FOR VASCULAR ACCESS Right Common Femoral Artery.;  Surgeon: Marty Heck, MD;  Location: [facility];  Service: Vascular;  Laterality: Right;   VIDEO ASSISTED THORACOSCOPY (VATS)/DECORTICATION Right 11/21/2011   drainage of empyema   Social History:  reports that he quit smoking about 3 years ago. His smoking use included cigarettes. He has a 5.00 pack-year smoking history. He has never used smokeless tobacco. He reports that he does not drink alcohol and does not use drugs.  Allergies  Allergen Reactions   Dilaudid [Hydromorphone Hcl] Rash   Ramipril Rash and Other (See Comments)    Per patient on R arm and leg only; no angioedema   Tramadol Itching and Rash    Family History  Problem Relation Age of Onset   Heart attack Father 88   Alcohol abuse Father    Aneurysm Mother     Stroke Neg Hx    Cancer Neg Hx     Prior to Admission medications   Medication Sig Start Date End Date Taking? Authorizing Provider  acetaminophen (TYLENOL) 500 MG tablet Take 1,000 mg by mouth every 6 (six) hours as needed for mild pain.    [provider]  apixaban (ELIQUIS) 5 MG TABS tablet Take 1 tablet (5 mg total) by mouth 2 (two) times daily. 02/28/22   Mapp, Claudia Desanctis, MD  atorvastatin (LIPITOR) 40 MG tablet Take 1 tablet (40 mg total) by mouth daily at 6 PM. 04/11/22   Gaylan Gerold, DO  clopidogrel (PLAVIX) 75 MG tablet Take 1 tablet (75 mg total) by mouth daily. 04/11/22 07/10/22  Serita Butcher, MD  diclofenac Sodium (VOLTAREN) 1 % GEL Apply 2-4 g topically in the morning, at noon, in the evening, and at bedtime. 12/26/21   [provider]  DULoxetine (CYMBALTA) 30 MG capsule Take 30 mg by mouth daily as needed (depression). 12/26/21   [provider]  feeding supplement (ENSURE ENLIVE / ENSURE PLUS) LIQD Take 237 mLs by mouth 2 (two) times daily between meals. 03/18/22   Terrilee Croak, MD  gabapentin (NEURONTIN) 100 MG capsule Take 100 mg by mouth at bedtime as needed (for pain).  12/31/19   [provider]  isosorbide mononitrate (IMDUR) 30 MG 24 hr tablet Take 1 tablet (30 mg total) by mouth daily. 04/12/22   Gaylan Gerold, DO  lidocaine-prilocaine (EMLA) cream Apply 1 Application topically as needed. 03/03/22   Owens Shark, NP  metoprolol succinate (TOPROL-XL) 25 MG 24 hr tablet Take 1 tablet (25 mg total) by mouth daily. 04/11/22 05/11/22  Gaylan Gerold, DO  ondansetron (ZOFRAN) 8 MG tablet TAKE 1 TABLET(8 MG) BY MOUTH EVERY 8 HOURS AS NEEDED FOR NAUSEA OR VOMITING Patient taking differently: Take 8 mg by mouth every 8 (eight) hours as needed for nausea or vomiting. 04/05/22   Ladell Pier, MD  oxyCODONE-acetaminophen (PERCOCET) 10-325 MG tablet Take 1 tablet by mouth every 6 (six) hours as needed for pain. 04/28/22 05/28/22  Virl Axe, MD   prochlorperazine (COMPAZINE) 10 MG tablet TAKE 1 TABLET(10 MG) BY MOUTH EVERY 6 HOURS AS NEEDED FOR NAUSEA OR VOMITING Patient taking differently: Take 10 mg by mouth every 6 (six) hours as needed for vomiting or nausea. 04/05/22   Ladell Pier, MD    Physical Exam: Vitals:   04/30/22 0000 04/30/22 0015 04/30/22 0030 04/30/22 0128  BP: (!) 102/55 (!) 92/59 103/73 106/64  Pulse: (!) 116 (!) 121 (!) 55 (!) 114  Resp: _0 Temp:    (!) 100.5 F (38.1 C)  TempSrc:      SpO2: 100% 100% 100% 99%   Physical Exam Vitals and nursing note reviewed.  Constitutional:      General: He is awake. He is not in acute distress.    Interventions: Cervical collar in place.  HENT:     Head: Normocephalic and atraumatic.     Comments: Bruising over right eye Cardiovascular:     Rate and Rhythm: Normal rate and regular rhythm.     Heart sounds: Normal heart sounds.  Pulmonary:     Effort: Pulmonary effort is normal.     Breath sounds: Normal breath sounds.  Abdominal:     Palpations: Abdomen is soft.     Tenderness: There is no abdominal tenderness.  Neurological:     General: No focal deficit present.     Mental Status: He is lethargic.     Labs on Admission: I have personally reviewed following labs and imaging studies  CBC: Recent Labs  Lab 04/29/22 2110 04/29/22 2118  WBC 6.2  --   HGB 12.4* 13.9  HCT 42.0 41.0  MCV 87.9  --   PLT 204  --    Basic Metabolic Panel: Recent Labs  Lab 04/25/22 0931 04/29/22 2110 04/29/22 2118  NA 138 140 141  K 3.6 4.6 4.7  CL 103 106 109  CO2 26 21*  --   GLUCOSE 128* 87 85  BUN _1 CREATININE 0.75 1.06 1.00  CALCIUM 9.3 9.0  --    GFR: Estimated Creatinine Clearance: 70.2 mL/min (by C-G formula based on SCr of 1 mg/dL). Liver Function Tests: Recent Labs  Lab 04/25/22 0931 04/29/22 2110  AST 32 154*  ALT 24 98*  ALKPHOS 217* 455*  BILITOT 0.3 0.8  PROT 6.9 7.5  ALBUMIN 3.4* 3.1*   No results for input(s):  "LIPASE", "AMYLASE" in the last 168 hours. Recent Labs  Lab 04/29/22 2113  AMMONIA 21   Coagulation Profile: Recent Labs  Lab 04/29/22 2110  INR 1.3*   Cardiac Enzymes: No results for input(s): "CKTOTAL", "CKMB", "CKMBINDEX", "TROPONINI" in the last 168 hours. BNP (last 3 results) No results for input(s): "PROBNP" in the last 8760 hours. HbA1C: No results for input(s): "HGBA1C" in the last 72 hours. CBG: No results for input(s): "GLUCAP" in the last 168 hours. Lipid Profile: No results for input(s): "CHOL", "HDL", "LDLCALC", "TRIG", "CHOLHDL", "LDLDIRECT" in the last 72 hours. Thyroid Function Tests: No results for input(s): "TSH", "T4TOTAL", "FREET4", "T3FREE", "THYROIDAB" in the last 72 hours. Anemia Panel: No results for input(s): "VITAMINB12", "FOLATE", "FERRITIN", "TIBC", "IRON", "RETICCTPCT" in the last 72 hours. Urine analysis:    Component Value Date/Time   COLORURINE YELLOW 04/09/2022 [place] 04/09/2022 1714   LABSPEC 1.021 04/09/2022 1714   PHURINE 5.0 04/09/2022 1714   GLUCOSEU NEGATIVE 04/09/2022 1714   HGBUR NEGATIVE 04/09/2022 1714   BILIRUBINUR NEGATIVE 04/09/2022 1714   KETONESUR NEGATIVE 04/09/2022 1714   PROTEINUR 30 (A) 04/09/2022 1714   UROBILINOGEN 0.2 11/19/2011 1740   NITRITE NEGATIVE 04/09/2022 1714   LEUKOCYTESUR NEGATIVE 04/09/2022 1714    Radiological Exams on Admission: CT Maxillofacial Wo Contrast  Result Date: 04/29/2022 CLINICAL DATA:  Fall today with blunt facial trauma. EXAM: CT MAXILLOFACIAL WITHOUT CONTRAST TECHNIQUE: Multidetector CT imaging of the maxillofacial structures was performed. Multiplanar CT image reconstructions were also generated. RADIATION DOSE REDUCTION: This exam was performed according to the departmental dose-optimization program which includes automated exposure control, adjustment of the mA and/or kV according to patient size and/or use of iterative reconstruction technique. COMPARISON:  Head CT  earlier today. FINDINGS: Osseous: Acute nondisplaced fracture of the right zygomatic arch. No acute fracture of the nasal bones or mandibles. In intact left zygomatic arch. Patient is edentulous. The temporomandibular joints are congruent. Patient is edentulous. Intact pterygoid plates. Orbits: Acute right orbital fractures involve the inferior and lateral orbital walls. Lateral wall fracture is comminuted and displaced. The inferior wall fracture is mildly displaced. No evidence of globe injury. No extraocular muscle entrapment. Intact left orbit and globe. Sinuses: Comminuted and mildly displaced fractures of the right maxillary sinus, involving anterior, lateral, superior and medial walls. Right maxillary hemosinus. Mucosal thickening throughout right ethmoid air cells. Soft tissues: Right cheek soft tissue edema. Right supraorbital soft tissue hematoma. Small amount of subcutaneous emphysema related to maxillary sinus fracture Limited intracranial: Assessed on head CT earlier today. No hemorrhage. IMPRESSION: 1. Acute right orbital fractures involve the inferior and lateral orbital walls. Lateral wall fracture is comminuted and displaced. The inferior wall fracture is mildly displaced. No evidence of globe injury or extraocular muscle entrapment. 2. Comminuted and mildly displaced fractures of the right maxillary sinus, involving anterior, lateral, superior and medial walls. Right maxillary hemosinus. 3. Acute nondisplaced fracture of the right zygomatic arch. Electronically Signed   By: Keith Rake M.D.   On: 04/29/2022 23:09   CT HEAD WO CONTRAST  Result Date: 04/29/2022 CLINICAL DATA:  Head trauma after fall EXAM: CT HEAD WITHOUT CONTRAST CT CERVICAL SPINE WITHOUT CONTRAST TECHNIQUE: Multidetector CT imaging of the head and cervical spine was performed following the standard protocol without intravenous contrast. Multiplanar CT image reconstructions of the cervical spine were also generated.  RADIATION DOSE REDUCTION: This exam was performed according to the departmental dose-optimization program which includes automated exposure control, adjustment of the mA and/or kV according to patient size and/or use of iterative reconstruction technique. COMPARISON:  CT head 03/11/2022 FINDINGS: CT HEAD FINDINGS Brain: No intracranial hemorrhage, mass effect, or  evidence of acute infarct. No hydrocephalus. No extra-axial fluid collection. Generalized cerebral atrophy. Ill-defined hypoattenuation within the cerebral white matter is nonspecific but consistent with chronic small vessel ischemic disease. Right cerebellar chronic infarcts. Vascular: No hyperdense vessel. Intracranial arterial calcification. Skull: No skull fracture. Sinuses/Orbits: Acute mildly displaced fractures of the right lateral and inferior orbital walls. No definite entrapment. Acute mildly displaced comminuted fractures of the anterior and posterior right maxillary sinus walls. Frothy fluid with air-fluid level in the right maxillary sinus likely due to hemosinus. The globes are intact. Other: Large soft tissue hematoma lateral to the right orbit. CT CERVICAL SPINE FINDINGS Alignment: Normal. Skull base and vertebrae: No acute fracture. No primary bone lesion or focal pathologic process. Soft tissues and spinal canal: No prevertebral fluid or swelling. No visible canal hematoma. Disc levels: Multilevel spondylosis and disc space height loss greatest at C5-C7 where it is moderate. Posterior disc osteophyte complexes at C5-C6 and C6-C7 cause mild effacement of the ventral thecal sac. No high-grade spinal canal narrowing. Uncovertebral spurring and facet arthropathy cause advanced neural foraminal narrowing bilaterally at C5-C6 at C6-C7. Upper chest: No acute abnormality. Other: Carotid bulb calcifications. IMPRESSION: 1. Acute comminuted displaced fractures of the right inferior and lateral orbital walls and anterior and posterior right maxillary  sinus walls. The inferior aspect of the maxillary sinuses is not included in the exam. Consider maxillofacial CT for further evaluation. 2. No acute intracranial hemorrhage. 3. No cervical spine fracture. Electronically Signed   By: Placido Sou M.D.   On: 04/29/2022 22:00   CT CERVICAL SPINE WO CONTRAST  Result Date: 04/29/2022 CLINICAL DATA:  Head trauma after fall EXAM: CT HEAD WITHOUT CONTRAST CT CERVICAL SPINE WITHOUT CONTRAST TECHNIQUE: Multidetector CT imaging of the head and cervical spine was performed following the standard protocol without intravenous contrast. Multiplanar CT image reconstructions of the cervical spine were also generated. RADIATION DOSE REDUCTION: This exam was performed according to the departmental dose-optimization program which includes automated exposure control, adjustment of the mA and/or kV according to patient size and/or use of iterative reconstruction technique. COMPARISON:  CT head 03/11/2022 FINDINGS: CT HEAD FINDINGS Brain: No intracranial hemorrhage, mass effect, or evidence of acute infarct. No hydrocephalus. No extra-axial fluid collection. Generalized cerebral atrophy. Ill-defined hypoattenuation within the cerebral white matter is nonspecific but consistent with chronic small vessel ischemic disease. Right cerebellar chronic infarcts. Vascular: No hyperdense vessel. Intracranial arterial calcification. Skull: No skull fracture. Sinuses/Orbits: Acute mildly displaced fractures of the right lateral and inferior orbital walls. No definite entrapment. Acute mildly displaced comminuted fractures of the anterior and posterior right maxillary sinus walls. Frothy fluid with air-fluid level in the right maxillary sinus likely due to hemosinus. The globes are intact. Other: Large soft tissue hematoma lateral to the right orbit. CT CERVICAL SPINE FINDINGS Alignment: Normal. Skull base and vertebrae: No acute fracture. No primary bone lesion or focal pathologic process.  Soft tissues and spinal canal: No prevertebral fluid or swelling. No visible canal hematoma. Disc levels: Multilevel spondylosis and disc space height loss greatest at C5-C7 where it is moderate. Posterior disc osteophyte complexes at C5-C6 and C6-C7 cause mild effacement of the ventral thecal sac. No high-grade spinal canal narrowing. Uncovertebral spurring and facet arthropathy cause advanced neural foraminal narrowing bilaterally at C5-C6 at C6-C7. Upper chest: No acute abnormality. Other: Carotid bulb calcifications. IMPRESSION: 1. Acute comminuted displaced fractures of the right inferior and lateral orbital walls and anterior and posterior right maxillary sinus walls. The inferior aspect of the  maxillary sinuses is not included in the exam. Consider maxillofacial CT for further evaluation. 2. No acute intracranial hemorrhage. 3. No cervical spine fracture. Electronically Signed   By: Placido Sou M.D.   On: 04/29/2022 22:00   DG Pelvis Portable  Result Date: 04/29/2022 CLINICAL DATA:  Trauma EXAM: PORTABLE PELVIS 1-2 VIEWS COMPARISON:  None Available. FINDINGS: No fracture or dislocation is seen. Bilateral hip joint spaces are preserved. Visualized bony pelvis appears intact. L5-S1 fixation hardware. IMPRESSION: Negative. Electronically Signed   By: Julian Hy M.D.   On: 04/29/2022 21:50   DG Chest Port 1 View  Result Date: 04/29/2022 CLINICAL DATA:  Trauma EXAM: PORTABLE CHEST 1 VIEW COMPARISON:  04/09/2022 FINDINGS: Lungs are clear.  No pleural effusion or pneumothorax. The heart is top-normal in size.  Thoracic aortic atherosclerosis. Right chest power port terminates at the cavoatrial junction. IMPRESSION: No evidence of acute cardiopulmonary disease. Electronically Signed   By: Julian Hy M.D.   On: 04/29/2022 21:50     Data Reviewed: Relevant notes from primary care and specialist visits, past discharge summaries as available in EHR, including Care Everywhere. Prior  diagnostic testing as pertinent to current admission diagnoses Updated medications and problem lists for reconciliation ED course, including vitals, labs, imaging, treatment and response to treatment Triage notes, nursing and pharmacy notes and ED provider's notes Notable results as noted in HPI   Assessment and Plan: * Fall at home, initial encounter Orbital wall fractures Multiple closed facial bones fracture, initial encounter Dr. Leonette Monarch was consulted from the ED and recommended monitoring for diplopia and avoid blowing nose and will see in the a.m. CT C-spine was nonacute Uncertain fall mechanism but suspecting possible syncopal event Follow urine drug screen. Ammonia, salicylate, acetaminophen and EtOH levels negative Cardiac monitoring showing ventricular bigeminy Continuous cardiac monitoring Neurologic checks  SIRS, possible sepsis (systemic inflammatory response syndrome) (HCC) Lactic acidosis SIRS and possible sepsis criteria include fever, tachycardia and lactic acidosis, borderline hypotension We will start sepsis fluids We will get a urinalysis and start sepsis work-up with blood cultures May consider broad-spectrum antibiotics for sepsis of unknown source if no localized source  CAD s/p CABG 2020, STEMI 01/2022 Elevated troponin Suspect demand ischemia from fall/possible syncope Patient initially had chest pain which resolved on its own Continue to trend troponin Continue atorvastatin, clopidogrel and apixaban  Ventricular bigeminy Patient with suspected syncopal episode with ventricular bigeminy Has a history of cardiac arrest in the setting of STEMI 01/2022 Ensure potassium above 4 and magnesium over 2 Continuous cardiac monitoring Metoprolol IV prn Consider cardiology consult in the a.m.  Abnormal LFTs Alk phos in the 400s previously in the 200s, possibly related to malignancy.  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 Inpatient status is judged to be reasonable and necessary in order to provide the required intensity of service to ensure  the patient's safety. The patient's presenting symptoms, physical exam findings, and initial radiographic and laboratory data in the context of their chronic comorbidities is felt to place them at high risk for further clinical deterioration. Furthermore, it is not anticipated that the patient will be medically stable for discharge from the hospital within 2 midnights of admission.   * I certify that at the point of admission it is my clinical judgment that the patient will require inpatient hospital care spanning beyond 2 midnights from the point of admission due to high intensity of service, high risk for further deterioration and high frequency of surveillance required.*  Author: Athena Masse, MD 04/30/2022 1:33 AM  For on call review [email or web address].

## 2022-04-30 NOTE — ED Notes (Signed)
ED TO INPATIENT HANDOFF REPORT  ED Nurse Name and Phone #: Jeannie Done [phone]  S Name/Age/Gender [name] [name] 61 y.o. male Room/Bed: 003C/003C  Code Status   Code Status: Full Code  Home/SNF/Other Home Patient oriented to: self, place, and situation Is this baseline? Yes   Triage Complete: Triage complete  Chief Complaint Ventricular bigeminy [I49.8]  Triage Note Pt arrives via [facility] from home this fall about 2 hours ago, able to get himself up off the floor. Pt was sitting in a wheelchair on arrival to scene. Pts normal  caregiver was not present, they did report pt was c/o chest discomfort earlier. En route, pt had bigeminy and trigeminy. LUE is normally contracture. Lungs were clear, 160/78, hr 112, cbg 155. Plavix and Eliquis.    Allergies Allergies  Allergen Reactions   Dilaudid [Hydromorphone Hcl] Rash   Ramipril Rash and Other (See Comments)    Per patient on R arm and leg only; no angioedema   Tramadol Itching and Rash    Level of Care/Admitting Diagnosis ED Disposition     ED Disposition  Admit   Condition  --   [place]: [place] [100100]  Level of Care: Progressive [102]  Admit to Progressive based on following criteria: CARDIOVASCULAR & THORACIC of moderate stability with acute coronary syndrome symptoms/low risk myocardial infarction/hypertensive urgency/arrhythmias/heart failure potentially compromising stability and stable post cardiovascular intervention patients.  May admit patient to [facility] or [facility] if equivalent level of care is available:: No  Covid Evaluation: Asymptomatic - no recent exposure (last 10 days) testing not required  Diagnosis: Ventricular bigeminy [893810]  Admitting Physician: Athena Masse [[id number]]  Attending Physician: Athena Masse [[id number]]  Certification:: I certify this patient will need inpatient services for at least 2 midnights  Estimated Length of Stay: 3           B Medical/Surgery History Past Medical History:  Diagnosis Date   Abnormal CT scan, gastrointestinal tract    Acute respiratory failure ([place]) 01/31/2022   Acute respiratory failure with hypoxemia ([place]) 02/11/2022   Anemia    Anemia 08/06/2012   Aortic valve mass 12/30/2015   Bilateral shoulder pain 08/20/2012   Bite wound of left hand 09/27/2021   Cardiogenic shock ([place]) 01/31/2022   Community acquired pneumonia 11/07/2011   Critical limb ischemia with history of revascularization of same extremity ([place]) 04/28/2020   DDD (degenerative disc disease), lumbosacral     and grade 2 slip   DVT (deep venous thrombosis) ([place]) 01/31/2022   GERD (gastroesophageal reflux disease)    H/O: stroke 01/31/2022   Heart murmur    History of anemia    no current med.   History of COVID-19 05/10/2019   History of critical lower limb ischemia 02/19/2019   Hyperglycemia 01/31/2022   Hypertension    states is borderline on medication; has been on med. x 5-6 yr.   Hypokalemia 05/30/2018   Impingement syndrome of shoulder region 10/2014   left   Insomnia 06/01/2015   Ischemic ulcer of toe of left foot (HCC)    Lactic acidosis 01/31/2022   Left shoulder pain 12/16/2015   Liver mass    Low back pain without sciatica 10/29/2009   Lupus ([place])    Multiple fractures of ribs, bilateral, due to CPR trauma 01/31/2022   Onychomycosis 01/28/2021   Paronychia of great toe 01/28/2021   Peripheral vascular disease ([place])    Pneumonia    Pressure injury of skin  01/31/2022   Pulmonary contusion 01/31/2022   RA (rheumatoid arthritis) ([place])    Rotator cuff tear 11/04/2014   STEMI (ST elevation myocardial infarction) ([place]) 01/31/2022   Sternal fracture 01/31/2022   Stroke ([place])    Transaminitis 12/16/2015   Past Surgical History:  Procedure Laterality Date   ABDOMINAL AORTAGRAM  02/20/2019   ABDOMINAL AORTOGRAM W/LOWER EXTREMITY N/A 02/20/2019   Procedure: ABDOMINAL AORTOGRAM W/LOWER EXTREMITY;   Surgeon: Marty Heck, MD;  Location: [facility];  Service: Cardiovascular;  Laterality: N/A;   ACHILLES TENDON SURGERY Bilateral    AMPUTATION Left 05/11/2020   Procedure: LEFT ABOVE KNEE AMPUTATION;  Surgeon: Angelia Mould, MD;  Location: [facility];  Service: Vascular;  Laterality: Left;   ENDARTERECTOMY POPLITEAL Left 05/01/2020   Procedure: ENDARTERECTOMY POPLITEAL;  Surgeon: Waynetta Sandy, MD;  Location: [facility];  Service: Vascular;  Laterality: Left;   FASCIOTOMY Left 05/01/2020   Procedure: FASCIOTOMY;  Surgeon: Waynetta Sandy, MD;  Location: [facility];  Service: Vascular;  Laterality: Left;   FEMORAL-POPLITEAL BYPASS GRAFT Left 02/26/2019   Procedure: BYPASS GRAFT FEMORAL-POPLITEAL ARTERY LEFT LEG USING 30m PROPATEN GRAFT;  Surgeon: BSerafina Mitchell MD;  [facility]  Service: Vascular;  Laterality: Left;   INTRAOPERATIVE ARTERIOGRAM Left 01/22/2020   Procedure: INTRA OPERATIVE ARTERIOGRAM with administration of thrombolyics in Left femoral to popliteal bypass.;  Surgeon: CMarty Heck MD;  Location[facility]  Service: Vascular;  Laterality: Left;   IR IMAGING GUIDED PORT INSERTION  03/07/2022   LEFT HEART CATH AND CORONARY ANGIOGRAPHY N/A 03/15/2017   Procedure: LEFT HEART CATH AND CORONARY ANGIOGRAPHY;  Surgeon: ENelva Bush MD;  [facility];  Service: Cardiovascular;  Laterality: N/A;   LEFT HEART CATH AND CORONARY ANGIOGRAPHY N/A 02/06/2022   Procedure: LEFT HEART CATH AND CORONARY ANGIOGRAPHY;  Surgeon: JMartinique Peter M, MD;  [facility];  Service: Cardiovascular;  Laterality: N/A;   LOWER EXTREMITY INTERVENTION Left 04/29/2020   Procedure: LOWER EXTREMITY INTERVENTION- LYSIS;  Surgeon: CMarty Heck MD;  [facility];  Service: Cardiovascular;  Laterality: Left;   OSTEOTOMY AND ULNAR SHORTENING Right 07/22/2002   PATCH ANGIOPLASTY Left 05/01/2020   Procedure: PATCH ANGIOPLASTY USING  HEMASHIELD PLATINUM FINESSE PATCH;  Surgeon: CWaynetta Sandy MD;  Location[facility]  Service: Vascular;  Laterality: Left;   PERIPHERAL VASCULAR ATHERECTOMY  01/23/2020   Procedure: PERIPHERAL VASCULAR ATHERECTOMY;  Surgeon: CMarty Heck MD;  [facility];  Service: Cardiovascular;;   PERIPHERAL VASCULAR BALLOON ANGIOPLASTY  01/23/2020   Procedure: PERIPHERAL VASCULAR BALLOON ANGIOPLASTY;  Surgeon: CMarty Heck MD;  [facility];  Service: Cardiovascular;;   PERIPHERAL VASCULAR INTERVENTION Left 04/30/2020   Procedure: PERIPHERAL VASCULAR INTERVENTION;  Surgeon: HCherre Robins MD;  [facility];  Service: Cardiovascular;  Laterality: Left;   PERIPHERAL VASCULAR THROMBECTOMY N/A 01/23/2020   Procedure: lysis recheck;  Surgeon: CMarty Heck MD;  [facility];  Service: Cardiovascular;  Laterality: N/A;  + [place]    PERIPHERAL VASCULAR THROMBECTOMY N/A 04/30/2020   Procedure: Lysis Recheck;  Surgeon: HCherre Robins MD;  [facility];  Service: Cardiovascular;  Laterality: N/A;   SHOULDER ARTHROSCOPY WITH BICEPSTENOTOMY Right 03/11/2015   Procedure: SHOULDER ARTHROSCOPY WITH BICEPSTENOTOMY;  Surgeon: DNinetta Lights MD;  Location[facility]  Service: Orthopedics;  Laterality: Right;   SHOULDER ARTHROSCOPY WITH DISTAL CLAVICLE  RESECTION Left 11/19/2014   Procedure: SHOULDER ARTHROSCOPY WITH DISTAL CLAVICLE RESECTION;  Surgeon: Kathryne Hitch, MD;  Location: [facility];  Service: Orthopedics;  Laterality: Left;   SHOULDER ARTHROSCOPY WITH ROTATOR CUFF REPAIR AND SUBACROMIAL DECOMPRESSION Left 11/19/2014   Procedure: LEFT SHOULDER ARTHROSCOPY, DEBRIDEMENT DISTAL CLAVICLE EXCISION, ACROMIOPLASTY WITH ROTATOR CUFF REPAIR ;  Surgeon: Kathryne Hitch, MD;  Location: [facility];  Service: Orthopedics;  Laterality: Left;   THROMBECTOMY OF BYPASS GRAFT  FEMORAL- POPLITEAL ARTERY Left 05/01/2020   Procedure: THROMBECTOMY OF LOWER EXTREMITY;  Surgeon: Waynetta Sandy, MD;  Location: [facility];  Service: Vascular;  Laterality: Left;   TRANSFORAMINAL LUMBAR INTERBODY FUSION (TLIF) WITH PEDICLE SCREW FIXATION 1 LEVEL N/A 01/03/2018   Procedure: TRANSFORAMINAL LUMBAR INTERBODY FUSION (TLIF) LUMBAR FIVE-SACRAL ONE;  Surgeon: Melina Schools, MD;  Location: [facility];  Service: Orthopedics;  Laterality: N/A;   ULTRASOUND GUIDANCE FOR VASCULAR ACCESS Right 01/22/2020   Procedure: ULTRASOUND GUIDANCE FOR VASCULAR ACCESS Right Common Femoral Artery.;  Surgeon: Marty Heck, MD;  Location: [facility];  Service: Vascular;  Laterality: Right;   VIDEO ASSISTED THORACOSCOPY (VATS)/DECORTICATION Right 11/21/2011   drainage of empyema     A IV Location/Drains/Wounds Patient Lines/Drains/Airways Status     Active Line/Drains/Airways     Name Placement date Placement time Site Days   Implanted Port 03/07/22 Right Chest 03/07/22  1100  Chest  54   Peripheral IV 04/30/22 18 G Right Antecubital 04/30/22  0223  Antecubital  less than 1   Peripheral IV 04/30/22 22 G Left;Posterior Hand 04/30/22  1038  Hand  less than 1   External Urinary Catheter 03/12/22  0224  --  49   External Urinary Catheter 04/30/22  0256  --  less than 1   Incision (Closed) 01/22/20 Groin 01/22/20  2341  -- 829   Incision (Closed) 05/01/20 Leg Left 05/01/20  1330  -- 729   Incision (Closed) 05/11/20 Leg 05/11/20  1121  -- 719   Pressure Injury 01/31/22 Buttocks Right Stage 1 -  Intact skin with non-blanchable redness of a localized area usually over a bony prominence. 01/31/22  1615  -- 89   Wound / Incision (Open or Dehisced) 02/19/19 Non-pressure wound Toe (Comment  which one) Left 02/19/19  2003  Toe (Comment  which one)  1166   Wound / Incision (Open or Dehisced) 02/19/19 Non-pressure wound Leg Right;Anterior;Lower 02/19/19  2000  Leg  1166   Wound / Incision (Open or Dehisced)  02/15/22 Puncture Abdomen Right;Upper 02/15/22  1439  Abdomen  74            Intake/Output Last 24 hours  Intake/Output Summary (Last 24 hours) at 04/30/2022 1748 Last data filed at 04/30/2022 0348 Gross per 24 hour  Intake 1500 ml  Output 0 ml  Net 1500 ml    Labs/Imaging Results for orders placed or performed during the hospital encounter of 04/29/22 (from the past 48 hour(s))  Comprehensive metabolic panel     Status: Abnormal   Collection Time: 04/29/22  9:10 PM  Result Value Ref Range   Sodium 140 135 - 145 mmol/L   Potassium 4.6 3.5 - 5.1 mmol/L   Chloride 106 98 - 111 mmol/L   CO2 21 (L) 22 - 32 mmol/L   Glucose, Bld 87 70 - 99 mg/dL    Comment: Glucose reference range applies only to samples taken after fasting for at least 8 hours.   BUN 17 8 - 23 mg/dL  Creatinine, Ser 1.06 0.61 - 1.24 mg/dL   Calcium 9.0 8.9 - 10.3 mg/dL   Total Protein 7.5 6.5 - 8.1 g/dL   Albumin 3.1 (L) 3.5 - 5.0 g/dL   AST 154 (H) 15 - 41 U/L   ALT 98 (H) 0 - 44 U/L   Alkaline Phosphatase 455 (H) 38 - 126 U/L   Total Bilirubin 0.8 0.3 - 1.2 mg/dL   GFR, Estimated >60 >60 mL/min    Comment: (NOTE) Calculated using the CKD-EPI Creatinine Equation (2021)    Anion gap 13 5 - 15    Comment: Performed at [facility], [place] [address] [place], [place] [address]  CBC     Status: Abnormal   Collection Time: 04/29/22  9:10 PM  Result Value Ref Range   WBC 6.2 4.0 - 10.5 K/uL   RBC 4.78 4.22 - 5.81 MIL/uL   Hemoglobin 12.4 (L) 13.0 - 17.0 g/dL   HCT 42.0 39.0 - 52.0 %   MCV 87.9 80.0 - 100.0 fL   MCH 25.9 (L) 26.0 - 34.0 pg   MCHC 29.5 (L) 30.0 - 36.0 g/dL   RDW 19.7 (H) 11.5 - 15.5 %   Platelets 204 150 - 400 K/uL   nRBC 0.0 0.0 - 0.2 %    Comment: Performed at [facility] [address], [place], [place] [address]  Ethanol     Status: None   Collection Time: 04/29/22  9:10 PM  Result Value Ref Range   Alcohol, Ethyl (B) <10 <10 mg/dL    Comment: (NOTE) Lowest  detectable limit for serum alcohol is 10 mg/dL.  For medical purposes only. Performed at [facility], [place] [address], [place], [place] [address]   Protime-INR     Status: Abnormal   Collection Time: 04/29/22  9:10 PM  Result Value Ref Range   Prothrombin Time 16.4 (H) 11.4 - 15.2 seconds   INR 1.3 (H) 0.8 - 1.2    Comment: (NOTE) INR goal varies based on device and disease states. Performed at [facility], [place] [address], [place], [place] [address]   Sample to Blood Bank     Status: None   Collection Time: 04/29/22  9:10 PM  Result Value Ref Range   Blood Bank Specimen SAMPLE AVAILABLE FOR TESTING    Sample Expiration      04/30/2022,2359 Performed at [facility], [place] [address], [place], [place] [address]   Salicylate level     Status: Abnormal   Collection Time: 04/29/22  9:10 PM  Result Value Ref Range   Salicylate Lvl <6.5 (L) 7.0 - 30.0 mg/dL    Comment: Performed at [facility] [address], [place], [place] [address]  Acetaminophen level     Status: Abnormal   Collection Time: 04/29/22  9:10 PM  Result Value Ref Range   Acetaminophen (Tylenol), Serum <10 (L) 10 - 30 ug/mL    Comment: (NOTE) Therapeutic concentrations vary significantly. A range of 10-30 ug/mL  may be an effective concentration for many patients. However, some  are best treated at concentrations outside of this range. Acetaminophen concentrations >150 ug/mL at 4 hours after ingestion  and >50 ug/mL at 12 hours after ingestion are often associated with  toxic reactions.  Performed at [facility], [place] [address] [place],  [address]   Troponin I (High Sensitivity)     Status: Abnormal   Collection Time: 04/29/22  9:10  PM  Result Value Ref Range   Troponin I (High Sensitivity) 37 (H) <18 ng/L    Comment: (NOTE) Elevated high sensitivity troponin I (hsTnI) values and significant  changes across serial measurements may suggest ACS but many other  chronic  and acute conditions are known to elevate hsTnI results.  Refer to the "Links" section for chest pain algorithms and additional  guidance. Performed at [facility], [place] [address] [place], [place] [address]   Lactic acid, plasma     Status: Abnormal   Collection Time: 04/29/22  9:13 PM  Result Value Ref Range   Lactic Acid, Venous 3.1 (HH) 0.5 - 1.9 mmol/L    Comment: CRITICAL RESULT CALLED TO, READ BACK BY AND VERIFIED WITH Assunta Found, RN AT 2230 ON 04/29/22 BY H. HOWARD. Performed at [facility], [place] [address] [place], [place] [address]   Ammonia     Status: None   Collection Time: 04/29/22  9:13 PM  Result Value Ref Range   Ammonia 21 9 - 35 umol/L    Comment: Performed at [facility], [place] [address] [place], [place] [address]  I-Stat Chem 8, ED     Status: Abnormal   Collection Time: 04/29/22  9:18 PM  Result Value Ref Range   Sodium 141 135 - 145 mmol/L   Potassium 4.7 3.5 - 5.1 mmol/L   Chloride 109 98 - 111 mmol/L   BUN 19 8 - 23 mg/dL   Creatinine, Ser 1.00 0.61 - 1.24 mg/dL   Glucose, Bld 85 70 - 99 mg/dL    Comment: Glucose reference range applies only to samples taken after fasting for at least 8 hours.   Calcium, Ion 1.08 (L) 1.15 - 1.40 mmol/L   TCO2 22 22 - 32 mmol/L   Hemoglobin 13.9 13.0 - 17.0 g/dL   HCT 41.0 39.0 - 52.0 %  Troponin I (High Sensitivity)     Status: Abnormal   Collection Time: 04/29/22 11:13 PM  Result Value Ref Range   Troponin I (High Sensitivity) 41 (H) <18 ng/L    Comment: (NOTE) Elevated high sensitivity troponin I (hsTnI) values and significant  changes across serial measurements may suggest ACS but many other  chronic and acute conditions are known to elevate hsTnI results.  Refer to the "Links" section for chest pain algorithms and additional  guidance. Performed at [facility], [place] [address] [place], [place] [address]   Culture, blood (x 2)     Status: None (Preliminary result)   Collection Time:  04/30/22  1:57 AM   Specimen: BLOOD  Result Value Ref Range   Specimen Description BLOOD RIGHT ANTECUBITAL    Special Requests      BOTTLES DRAWN AEROBIC AND ANAEROBIC Blood Culture results may not be optimal due to an inadequate volume of blood received in culture bottles   Culture  Setup Time      GRAM NEGATIVE RODS ANAEROBIC BOTTLE ONLY CRITICAL RESULT CALLED TO, READ BACK BY AND VERIFIED WITH: PHARMD N.HURTS AT 5784 ON 04/30/2022 BY T.SAAD. Performed at [facility], [place] [address] [place], [place] [address]    Culture GRAM NEGATIVE RODS    Report Status PENDING   Blood Culture ID Panel (Reflexed)     Status: Abnormal   Collection Time: 04/30/22  1:57 AM  Result Value Ref Range   Enterococcus faecalis NOT DETECTED NOT DETECTED   Enterococcus Faecium NOT DETECTED NOT DETECTED   Listeria  monocytogenes NOT DETECTED NOT DETECTED   Staphylococcus species NOT DETECTED NOT DETECTED   Staphylococcus aureus (BCID) NOT DETECTED NOT DETECTED   Staphylococcus epidermidis NOT DETECTED NOT DETECTED   Staphylococcus lugdunensis NOT DETECTED NOT DETECTED   Streptococcus species NOT DETECTED NOT DETECTED   Streptococcus agalactiae NOT DETECTED NOT DETECTED   Streptococcus pneumoniae NOT DETECTED NOT DETECTED   Streptococcus pyogenes NOT DETECTED NOT DETECTED   A.calcoaceticus-baumannii NOT DETECTED NOT DETECTED   Bacteroides fragilis NOT DETECTED NOT DETECTED   Enterobacterales DETECTED (A) NOT DETECTED    Comment: Enterobacterales represent a large order of gram negative bacteria, not a single organism. CRITICAL RESULT CALLED TO, READ BACK BY AND VERIFIED WITH: PHARMD N.HURTS AT 1525 ON 04/30/2022 BY T.SAAD.    Enterobacter cloacae complex NOT DETECTED NOT DETECTED   Escherichia coli NOT DETECTED NOT DETECTED   Klebsiella aerogenes NOT DETECTED NOT DETECTED   Klebsiella oxytoca NOT DETECTED NOT DETECTED   Klebsiella pneumoniae DETECTED (A) NOT DETECTED    Comment: CRITICAL RESULT  CALLED TO, READ BACK BY AND VERIFIED WITH: PHARMD N.HURTS AT 1525 ON 04/30/2022 BY T.SAAD.    Proteus species NOT DETECTED NOT DETECTED   Salmonella species NOT DETECTED NOT DETECTED   Serratia marcescens NOT DETECTED NOT DETECTED   Haemophilus influenzae NOT DETECTED NOT DETECTED   Neisseria meningitidis NOT DETECTED NOT DETECTED   Pseudomonas aeruginosa NOT DETECTED NOT DETECTED   Stenotrophomonas maltophilia NOT DETECTED NOT DETECTED   Candida albicans NOT DETECTED NOT DETECTED   Candida auris NOT DETECTED NOT DETECTED   Candida glabrata NOT DETECTED NOT DETECTED   Candida krusei NOT DETECTED NOT DETECTED   Candida parapsilosis NOT DETECTED NOT DETECTED   Candida tropicalis NOT DETECTED NOT DETECTED   Cryptococcus neoformans/gattii NOT DETECTED NOT DETECTED   CTX-M ESBL NOT DETECTED NOT DETECTED   Carbapenem resistance IMP NOT DETECTED NOT DETECTED   Carbapenem resistance KPC NOT DETECTED NOT DETECTED   Carbapenem resistance NDM NOT DETECTED NOT DETECTED   Carbapenem resist OXA 48 LIKE NOT DETECTED NOT DETECTED   Carbapenem resistance VIM NOT DETECTED NOT DETECTED    Comment: Performed at [facility], [place] [address], [place], [place] [address]  Basic metabolic panel     Status: Abnormal   Collection Time: 04/30/22  4:54 AM  Result Value Ref Range   Sodium 140 135 - 145 mmol/L   Potassium 3.8 3.5 - 5.1 mmol/L   Chloride 110 98 - 111 mmol/L   CO2 22 22 - 32 mmol/L   Glucose, Bld 83 70 - 99 mg/dL    Comment: Glucose reference range applies only to samples taken after fasting for at least 8 hours.   BUN 14 8 - 23 mg/dL   Creatinine, Ser 0.93 0.61 - 1.24 mg/dL   Calcium 8.4 (L) 8.9 - 10.3 mg/dL   GFR, Estimated >60 >60 mL/min    Comment: (NOTE) Calculated using the CKD-EPI Creatinine Equation (2021)    Anion gap 8 5 - 15    Comment: Performed at [facility] [address], [place], [place] [address]  CBC     Status: Abnormal   Collection Time: 04/30/22  4:54 AM   Result Value Ref Range   WBC 5.5 4.0 - 10.5 K/uL   RBC 3.53 (L) 4.22 - 5.81 MIL/uL   Hemoglobin 9.3 (L) 13.0 - 17.0 g/dL    Comment: REPEATED TO VERIFY   HCT 29.8 (L) 39.0 - 52.0 %   MCV 84.4 80.0 -  100.0 fL   MCH 26.3 26.0 - 34.0 pg   MCHC 31.2 30.0 - 36.0 g/dL   RDW 19.7 (H) 11.5 - 15.5 %   Platelets 169 150 - 400 K/uL   nRBC 0.0 0.0 - 0.2 %    Comment: Performed at [facility], [place] [address], [place], [place] [address]  Lactic acid, plasma     Status: None   Collection Time: 04/30/22  4:54 AM  Result Value Ref Range   Lactic Acid, Venous 1.2 0.5 - 1.9 mmol/L    Comment: Performed at [facility] [address], [place], [place] [address]  Urinalysis, Complete w Microscopic     Status: None   Collection Time: 04/30/22  9:27 AM  Result Value Ref Range   Color, Urine YELLOW YELLOW   APPearance CLEAR CLEAR   Specific Gravity, Urine 1.017 1.005 - 1.030   pH 6.0 5.0 - 8.0   Glucose, UA NEGATIVE NEGATIVE mg/dL   Hgb urine dipstick NEGATIVE NEGATIVE   Bilirubin Urine NEGATIVE NEGATIVE   Ketones, ur NEGATIVE NEGATIVE mg/dL   Protein, ur NEGATIVE NEGATIVE mg/dL   Nitrite NEGATIVE NEGATIVE   Leukocytes,Ua NEGATIVE NEGATIVE   RBC / HPF 0-5 0 - 5 RBC/hpf   WBC, UA 0-5 0 - 5 WBC/hpf   Bacteria, UA NONE SEEN NONE SEEN   Mucus PRESENT     Comment: Performed at [facility], [place] [address], [place], [place] [address]  Rapid urine drug screen (hospital performed)     Status: Abnormal   Collection Time: 04/30/22  9:27 AM  Result Value Ref Range   Opiates POSITIVE (A) NONE DETECTED   Cocaine NONE DETECTED NONE DETECTED   Benzodiazepines NONE DETECTED NONE DETECTED   Amphetamines NONE DETECTED NONE DETECTED   Tetrahydrocannabinol NONE DETECTED NONE DETECTED   Barbiturates NONE DETECTED NONE DETECTED    Comment: (NOTE) DRUG SCREEN FOR MEDICAL PURPOSES ONLY.  IF CONFIRMATION IS NEEDED FOR ANY PURPOSE, NOTIFY LAB WITHIN 5 DAYS.  LOWEST DETECTABLE LIMITS FOR  URINE DRUG SCREEN Drug Class                     Cutoff (ng/mL) Amphetamine and metabolites    1000 Barbiturate and metabolites    200 Benzodiazepine                 200 Opiates and metabolites        300 Cocaine and metabolites        300 THC                            50 Performed at [facility], [place] [address] [place], [place] [address]    CT Maxillofacial Wo Contrast  Result Date: 04/29/2022 CLINICAL DATA:  Fall today with blunt facial trauma. EXAM: CT MAXILLOFACIAL WITHOUT CONTRAST TECHNIQUE: Multidetector CT imaging of the maxillofacial structures was performed. Multiplanar CT image reconstructions were also generated. RADIATION DOSE REDUCTION: This exam was performed according to the departmental dose-optimization program which includes automated exposure control, adjustment of the mA and/or kV according to patient size and/or use of iterative reconstruction technique. COMPARISON:  Head CT earlier today. FINDINGS: Osseous: Acute nondisplaced fracture of the right zygomatic arch. No acute fracture of the nasal bones or mandibles. In intact left zygomatic arch. Patient is edentulous. The temporomandibular joints are congruent. Patient is edentulous. Intact pterygoid plates. Orbits: Acute right orbital fractures  involve the inferior and lateral orbital walls. Lateral wall fracture is comminuted and displaced. The inferior wall fracture is mildly displaced. No evidence of globe injury. No extraocular muscle entrapment. Intact left orbit and globe. Sinuses: Comminuted and mildly displaced fractures of the right maxillary sinus, involving anterior, lateral, superior and medial walls. Right maxillary hemosinus. Mucosal thickening throughout right ethmoid air cells. Soft tissues: Right cheek soft tissue edema. Right supraorbital soft tissue hematoma. Small amount of subcutaneous emphysema related to maxillary sinus fracture Limited intracranial: Assessed on head CT earlier today. No hemorrhage.  IMPRESSION: 1. Acute right orbital fractures involve the inferior and lateral orbital walls. Lateral wall fracture is comminuted and displaced. The inferior wall fracture is mildly displaced. No evidence of globe injury or extraocular muscle entrapment. 2. Comminuted and mildly displaced fractures of the right maxillary sinus, involving anterior, lateral, superior and medial walls. Right maxillary hemosinus. 3. Acute nondisplaced fracture of the right zygomatic arch. Electronically Signed   By: Keith Rake M.D.   On: 04/29/2022 23:09   CT HEAD WO CONTRAST  Result Date: 04/29/2022 CLINICAL DATA:  Head trauma after fall EXAM: CT HEAD WITHOUT CONTRAST CT CERVICAL SPINE WITHOUT CONTRAST TECHNIQUE: Multidetector CT imaging of the head and cervical spine was performed following the standard protocol without intravenous contrast. Multiplanar CT image reconstructions of the cervical spine were also generated. RADIATION DOSE REDUCTION: This exam was performed according to the departmental dose-optimization program which includes automated exposure control, adjustment of the mA and/or kV according to patient size and/or use of iterative reconstruction technique. COMPARISON:  CT head 03/11/2022 FINDINGS: CT HEAD FINDINGS Brain: No intracranial hemorrhage, mass effect, or evidence of acute infarct. No hydrocephalus. No extra-axial fluid collection. Generalized cerebral atrophy. Ill-defined hypoattenuation within the cerebral white matter is nonspecific but consistent with chronic small vessel ischemic disease. Right cerebellar chronic infarcts. Vascular: No hyperdense vessel. Intracranial arterial calcification. Skull: No skull fracture. Sinuses/Orbits: Acute mildly displaced fractures of the right lateral and inferior orbital walls. No definite entrapment. Acute mildly displaced comminuted fractures of the anterior and posterior right maxillary sinus walls. Frothy fluid with air-fluid level in the right maxillary  sinus likely due to hemosinus. The globes are intact. Other: Large soft tissue hematoma lateral to the right orbit. CT CERVICAL SPINE FINDINGS Alignment: Normal. Skull base and vertebrae: No acute fracture. No primary bone lesion or focal pathologic process. Soft tissues and spinal canal: No prevertebral fluid or swelling. No visible canal hematoma. Disc levels: Multilevel spondylosis and disc space height loss greatest at C5-C7 where it is moderate. Posterior disc osteophyte complexes at C5-C6 and C6-C7 cause mild effacement of the ventral thecal sac. No high-grade spinal canal narrowing. Uncovertebral spurring and facet arthropathy cause advanced neural foraminal narrowing bilaterally at C5-C6 at C6-C7. Upper chest: No acute abnormality. Other: Carotid bulb calcifications. IMPRESSION: 1. Acute comminuted displaced fractures of the right inferior and lateral orbital walls and anterior and posterior right maxillary sinus walls. The inferior aspect of the maxillary sinuses is not included in the exam. Consider maxillofacial CT for further evaluation. 2. No acute intracranial hemorrhage. 3. No cervical spine fracture. Electronically Signed   By: Placido Sou M.D.   On: 04/29/2022 22:00   CT CERVICAL SPINE WO CONTRAST  Result Date: 04/29/2022 CLINICAL DATA:  Head trauma after fall EXAM: CT HEAD WITHOUT CONTRAST CT CERVICAL SPINE WITHOUT CONTRAST TECHNIQUE: Multidetector CT imaging of the head and cervical spine was performed following the standard protocol without intravenous contrast. Multiplanar CT image reconstructions of  the cervical spine were also generated. RADIATION DOSE REDUCTION: This exam was performed according to the departmental dose-optimization program which includes automated exposure control, adjustment of the mA and/or kV according to patient size and/or use of iterative reconstruction technique. COMPARISON:  CT head 03/11/2022 FINDINGS: CT HEAD FINDINGS Brain: No intracranial hemorrhage, mass  effect, or evidence of acute infarct. No hydrocephalus. No extra-axial fluid collection. Generalized cerebral atrophy. Ill-defined hypoattenuation within the cerebral white matter is nonspecific but consistent with chronic small vessel ischemic disease. Right cerebellar chronic infarcts. Vascular: No hyperdense vessel. Intracranial arterial calcification. Skull: No skull fracture. Sinuses/Orbits: Acute mildly displaced fractures of the right lateral and inferior orbital walls. No definite entrapment. Acute mildly displaced comminuted fractures of the anterior and posterior right maxillary sinus walls. Frothy fluid with air-fluid level in the right maxillary sinus likely due to hemosinus. The globes are intact. Other: Large soft tissue hematoma lateral to the right orbit. CT CERVICAL SPINE FINDINGS Alignment: Normal. Skull base and vertebrae: No acute fracture. No primary bone lesion or focal pathologic process. Soft tissues and spinal canal: No prevertebral fluid or swelling. No visible canal hematoma. Disc levels: Multilevel spondylosis and disc space height loss greatest at C5-C7 where it is moderate. Posterior disc osteophyte complexes at C5-C6 and C6-C7 cause mild effacement of the ventral thecal sac. No high-grade spinal canal narrowing. Uncovertebral spurring and facet arthropathy cause advanced neural foraminal narrowing bilaterally at C5-C6 at C6-C7. Upper chest: No acute abnormality. Other: Carotid bulb calcifications. IMPRESSION: 1. Acute comminuted displaced fractures of the right inferior and lateral orbital walls and anterior and posterior right maxillary sinus walls. The inferior aspect of the maxillary sinuses is not included in the exam. Consider maxillofacial CT for further evaluation. 2. No acute intracranial hemorrhage. 3. No cervical spine fracture. Electronically Signed   By: Placido Sou M.D.   On: 04/29/2022 22:00   DG Pelvis Portable  Result Date: 04/29/2022 CLINICAL DATA:  Trauma  EXAM: PORTABLE PELVIS 1-2 VIEWS COMPARISON:  None Available. FINDINGS: No fracture or dislocation is seen. Bilateral hip joint spaces are preserved. Visualized bony pelvis appears intact. L5-S1 fixation hardware. IMPRESSION: Negative. Electronically Signed   By: Julian Hy M.D.   On: 04/29/2022 21:50   DG Chest Port 1 View  Result Date: 04/29/2022 CLINICAL DATA:  Trauma EXAM: PORTABLE CHEST 1 VIEW COMPARISON:  04/09/2022 FINDINGS: Lungs are clear.  No pleural effusion or pneumothorax. The heart is top-normal in size.  Thoracic aortic atherosclerosis. Right chest power port terminates at the cavoatrial junction. IMPRESSION: No evidence of acute cardiopulmonary disease. Electronically Signed   By: Julian Hy M.D.   On: 04/29/2022 21:50    Pending Labs Unresulted Labs (From admission, onward)     Start     Ordered   05/01/22 0500  CBC  Tomorrow morning,   R        04/30/22 0809   05/01/22 1610  Basic metabolic panel  Tomorrow morning,   R        04/30/22 0809   04/30/22 1445  Hemoglobin and hematocrit, blood  Once,   R        04/30/22 1444   04/30/22 0153  Urinalysis, Routine w reflex microscopic  ONCE - URGENT,   URGENT        04/30/22 0152   04/30/22 0153  Procalcitonin  ONCE - URGENT,   URGENT        04/30/22 0152   04/30/22 0152  Culture, blood (x 2)  BLOOD  CULTURE X 2,   R     Comments: INITIATE ANTIBIOTICS WITHIN 1 HOUR AFTER BLOOD CULTURES DRAWN.  If unable to obtain blood cultures, call MD immediately regarding antibiotic instructions.    04/30/22 0152   04/30/22 0152  APTT  ONCE - STAT,   STAT        04/30/22 0152   04/30/22 0143  Magnesium  Add-on,   AD        04/30/22 0142            Vitals/Pain Today's Vitals   04/30/22 1630 04/30/22 1645 04/30/22 1700 04/30/22 1715  BP: (!) 101/56 (!) 116/55 115/73 131/72  Pulse: (!) 103   (!) 113  Resp: 15 (!) 29 (!) 27 (!) 28  Temp:      TempSrc:      SpO2: 99% 99% 99% 100%  Weight:      Height:      PainSc:         Isolation Precautions No active isolations  Medications Medications  atorvastatin (LIPITOR) tablet 40 mg (has no administration in time range)  isosorbide mononitrate (IMDUR) 24 hr tablet 30 mg (30 mg Oral Given 04/30/22 0919)  metoprolol succinate (TOPROL-XL) 24 hr tablet 25 mg (25 mg Oral Given 04/30/22 0919)  DULoxetine (CYMBALTA) DR capsule 30 mg (has no administration in time range)  apixaban (ELIQUIS) tablet 5 mg (5 mg Oral Given 04/30/22 0919)  clopidogrel (PLAVIX) tablet 75 mg (75 mg Oral Given 04/30/22 0919)  sodium chloride flush (NS) 0.9 % injection 3 mL (3 mLs Intravenous Given 04/30/22 0922)  acetaminophen (TYLENOL) tablet 650 mg (650 mg Oral Given 04/30/22 1039)    Or  acetaminophen (TYLENOL) suppository 650 mg ( Rectal See Alternative 04/30/22 1039)  ondansetron (ZOFRAN) tablet 4 mg (has no administration in time range)    Or  ondansetron (ZOFRAN) injection 4 mg (has no administration in time range)  oxyCODONE-acetaminophen (PERCOCET/ROXICET) 5-325 MG per tablet 1 tablet (1 tablet Oral Given 04/30/22 1352)    And  oxyCODONE (Oxy IR/ROXICODONE) immediate release tablet 5 mg (5 mg Oral Given 04/30/22 1352)  lactated ringers infusion ( Intravenous New Bag/Given 04/30/22 0931)  cefTRIAXone (ROCEPHIN) 2 g in sodium chloride 0.9 % 100 mL IVPB (2 g Intravenous New Bag/Given 04/30/22 1713)  lactated ringers bolus 500 mL (0 mLs Intravenous Stopped 04/30/22 0128)  lactated ringers bolus 1,000 mL (0 mLs Intravenous Stopped 04/30/22 0348)  haloperidol lactate (HALDOL) injection 1 mg (1 mg Intravenous Given 04/30/22 0434)    Mobility manual wheelchair High fall risk   Focused Assessments Neuro Assessment Handoff:  Swallow screen pass? Yes  Cardiac Rhythm: Sinus tachycardia       Neuro Assessment: Exceptions to WDL Neuro Checks:      Last Documented NIHSS Modified Score:   Has TPA been given? No If patient is a Neuro Trauma and patient is going to OR before floor call report to  [place] nurse: [phone] or [phone]   R Recommendations: See Admitting Provider Note  Report given to:   Additional Notes: a/ox3 (baseline), caregiver at home, condom cath

## 2022-04-30 NOTE — Progress Notes (Signed)
Orthopedic Tech Progress Note Patient Details:  [name] [name] 1961/01/09 [id number]  Patient ID: [name], male   DOB: 06-16-1961, 61 y.o.   MRN: [id number] I attended trauma page. Karolee Stamps 04/30/2022, 2:41 AM

## 2022-04-30 NOTE — Assessment & Plan Note (Signed)
Continue apixaban 

## 2022-04-30 NOTE — ED Notes (Signed)
Pt attempting to sit up in bed and trying to get out of the. Pt educated on the importance of staying in bed and staying still. Pt was able to repeat what he was told and verbalized understanding. Pt continues to be ST on the monitor with bigeminy and trigeminy PVC

## 2022-04-30 NOTE — Assessment & Plan Note (Deleted)
Multiple closed facial bones fracture, initial encounter Dr. Leonette Monarch was consulted from the ED and recommended monitoring for diplopia and avoid blowing nose and will see in the a.m.

## 2022-04-30 NOTE — Assessment & Plan Note (Signed)
Initiated on chemotherapy on 04/08/2022

## 2022-04-30 NOTE — ED Notes (Signed)
Dr. Damita Dunnings responded to [facility] message and received a call back from provider

## 2022-04-30 NOTE — Assessment & Plan Note (Addendum)
Continue statin and apixaban and

## 2022-04-30 NOTE — ED Notes (Signed)
Sent provider a message via [employer] at this time

## 2022-04-30 NOTE — ED Notes (Signed)
Eliseo Squires, MD paged regarding pts BP readings and temperature.

## 2022-04-30 NOTE — Assessment & Plan Note (Addendum)
Orbital wall fractures Multiple closed facial bones fracture, initial encounter Dr. Leonette Monarch was consulted from the ED and recommended monitoring for diplopia and avoid blowing nose and will see in the a.m. CT C-spine was nonacute Uncertain fall mechanism but suspecting possible syncopal event Follow urine drug screen. Ammonia, salicylate, acetaminophen and EtOH levels negative Cardiac monitoring showing ventricular bigeminy Continuous cardiac monitoring Neurologic checks

## 2022-04-30 NOTE — Assessment & Plan Note (Addendum)
Lactic acidosis SIRS and possible sepsis criteria include fever, tachycardia and lactic acidosis, borderline hypotension We will start sepsis fluids We will get a urinalysis and start sepsis work-up with blood cultures May consider broad-spectrum antibiotics for sepsis of unknown source if no localized source

## 2022-04-30 NOTE — ED Notes (Signed)
Noted pt has urinated on himself. Clothing and sheets are wet. Locating assistance at this time due to c-spine needs to be maintained while turning pt to clean him up

## 2022-04-30 NOTE — ED Notes (Signed)
C-collar removed per provider c-spine is cleared

## 2022-04-30 NOTE — Assessment & Plan Note (Signed)
On chronic oxycodone Judicious use Follow urine drug screen

## 2022-04-30 NOTE — Consult Note (Signed)
Reason for Consult:Facial Fracture Referring Physician: Dr Leonette Monarch, Dr Blanchie Dessert is an 61 y.o. male.  HPI: 61 year old male who fell at home yesterday striking in the right side of his face.  He was transported to the emergency department for evaluation and in that evaluation he was found to have facial fractures.  I was asked to evaluate him for possible surgical intervention.  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   History of COVID-19 05/10/2019   History of critical lower limb ischemia 02/19/2019   Hyperglycemia 01/31/2022   Hypertension    states is borderline on medication; has been on med. x 5-6 yr.   Hypokalemia 05/30/2018   Impingement syndrome of shoulder region 10/2014   left   Insomnia 06/01/2015   Ischemic ulcer of toe of left foot (HCC)    Lactic acidosis 01/31/2022   Left shoulder pain 12/16/2015   Liver mass    Low back pain without sciatica 10/29/2009   Lupus ([place])    Multiple fractures of ribs, bilateral, due to CPR trauma 01/31/2022   Onychomycosis 01/28/2021   Paronychia of great toe 01/28/2021   Peripheral vascular disease ([place])    Pneumonia    Pressure injury of skin 01/31/2022   Pulmonary contusion 01/31/2022   RA (rheumatoid arthritis) ([place])     Rotator cuff tear 11/04/2014   STEMI (ST elevation myocardial infarction) ([place]) 01/31/2022   Sternal fracture 01/31/2022   Stroke ([place])    Transaminitis 12/16/2015    Past Surgical History:  Procedure Laterality Date   ABDOMINAL AORTAGRAM  02/20/2019   ABDOMINAL AORTOGRAM W/LOWER EXTREMITY N/A 02/20/2019   Procedure: ABDOMINAL AORTOGRAM W/LOWER EXTREMITY;  Surgeon: Marty Heck, MD;  Location: [facility];  Service: Cardiovascular;  Laterality: N/A;   ACHILLES TENDON SURGERY Bilateral    AMPUTATION Left 05/11/2020   Procedure: LEFT ABOVE KNEE AMPUTATION;  Surgeon: Angelia Mould, MD;  Location: [facility];  Service: Vascular;  Laterality: Left;   ENDARTERECTOMY POPLITEAL Left 05/01/2020   Procedure: ENDARTERECTOMY POPLITEAL;  Surgeon: Waynetta Sandy, MD;  Location: [facility];  Service: Vascular;  Laterality: Left;   FASCIOTOMY Left 05/01/2020   Procedure: FASCIOTOMY;  Surgeon: Waynetta Sandy, MD;  Location: [facility];  Service: Vascular;  Laterality: Left;   FEMORAL-POPLITEAL BYPASS GRAFT Left 02/26/2019   Procedure: BYPASS GRAFT FEMORAL-POPLITEAL ARTERY LEFT LEG USING 70m PROPATEN GRAFT;  Surgeon: BSerafina Mitchell MD;  [facility];  Service: Vascular;  Laterality: Left;   INTRAOPERATIVE ARTERIOGRAM Left 01/22/2020   Procedure: INTRA OPERATIVE ARTERIOGRAM with administration of thrombolyics in Left femoral to popliteal bypass.;  Surgeon: CMarty Heck MD;  [facility]  Service: Vascular;  Laterality: Left;   IR IMAGING GUIDED PORT INSERTION  03/07/2022   LEFT HEART CATH AND CORONARY ANGIOGRAPHY N/A 03/15/2017   Procedure: LEFT HEART CATH AND CORONARY ANGIOGRAPHY;  Surgeon: Nelva Bush, MD;  Location: [facility];  Service: Cardiovascular;  Laterality: N/A;   LEFT HEART CATH AND CORONARY ANGIOGRAPHY N/A 02/06/2022   Procedure: LEFT HEART CATH AND CORONARY ANGIOGRAPHY;  Surgeon: Martinique, Peter M, MD;  Location: [facility];  Service:  Cardiovascular;  Laterality: N/A;   LOWER EXTREMITY INTERVENTION Left 04/29/2020   Procedure: LOWER EXTREMITY INTERVENTION- LYSIS;  Surgeon: Marty Heck, MD;  Location: [facility];  Service: Cardiovascular;  Laterality: Left;   OSTEOTOMY AND ULNAR SHORTENING Right 07/22/2002   PATCH ANGIOPLASTY Left 05/01/2020   Procedure: PATCH ANGIOPLASTY USING HEMASHIELD PLATINUM FINESSE PATCH;  Surgeon: Waynetta Sandy, MD;  Location: [facility];  Service: Vascular;  Laterality: Left;   PERIPHERAL VASCULAR ATHERECTOMY  01/23/2020   Procedure: PERIPHERAL VASCULAR ATHERECTOMY;  Surgeon: Marty Heck, MD;  Location: [facility];  Service: Cardiovascular;;   PERIPHERAL VASCULAR BALLOON ANGIOPLASTY  01/23/2020   Procedure: PERIPHERAL VASCULAR BALLOON ANGIOPLASTY;  Surgeon: Marty Heck, MD;  Location: [facility];  Service: Cardiovascular;;   PERIPHERAL VASCULAR INTERVENTION Left 04/30/2020   Procedure: PERIPHERAL VASCULAR INTERVENTION;  Surgeon: Cherre Robins, MD;  Location: [facility];  Service: Cardiovascular;  Laterality: Left;   PERIPHERAL VASCULAR THROMBECTOMY N/A 01/23/2020   Procedure: lysis recheck;  Surgeon: Marty Heck, MD;  Location: [facility];  Service: Cardiovascular;  Laterality: N/A;  + [place]    PERIPHERAL VASCULAR THROMBECTOMY N/A 04/30/2020   Procedure: Lysis Recheck;  Surgeon: Cherre Robins, MD;  Location: [facility];  Service: Cardiovascular;  Laterality: N/A;   SHOULDER ARTHROSCOPY WITH BICEPSTENOTOMY Right 03/11/2015   Procedure: SHOULDER ARTHROSCOPY WITH BICEPSTENOTOMY;  Surgeon: Ninetta Lights, MD;  Location: [facility];  Service: Orthopedics;  Laterality: Right;   SHOULDER ARTHROSCOPY WITH DISTAL CLAVICLE RESECTION Left 11/19/2014   Procedure: SHOULDER ARTHROSCOPY WITH DISTAL CLAVICLE RESECTION;  Surgeon: Kathryne Hitch, MD;  Location: [facility];  Service: Orthopedics;   Laterality: Left;   SHOULDER ARTHROSCOPY WITH ROTATOR CUFF REPAIR AND SUBACROMIAL DECOMPRESSION Left 11/19/2014   Procedure: LEFT SHOULDER ARTHROSCOPY, DEBRIDEMENT DISTAL CLAVICLE EXCISION, ACROMIOPLASTY WITH ROTATOR CUFF REPAIR ;  Surgeon: Kathryne Hitch, MD;  Location: [facility];  Service: Orthopedics;  Laterality: Left;   THROMBECTOMY OF BYPASS GRAFT FEMORAL- POPLITEAL ARTERY Left 05/01/2020   Procedure: THROMBECTOMY OF LOWER EXTREMITY;  Surgeon: Waynetta Sandy, MD;  Location: [facility];  Service: Vascular;  Laterality: Left;   TRANSFORAMINAL LUMBAR INTERBODY FUSION (TLIF) WITH PEDICLE SCREW FIXATION 1 LEVEL N/A 01/03/2018   Procedure: TRANSFORAMINAL LUMBAR INTERBODY FUSION (TLIF) LUMBAR FIVE-SACRAL ONE;  Surgeon: Melina Schools, MD;  Location: [facility];  Service: Orthopedics;  Laterality: N/A;   ULTRASOUND GUIDANCE FOR VASCULAR ACCESS Right 01/22/2020   Procedure: ULTRASOUND GUIDANCE FOR VASCULAR ACCESS Right Common Femoral Artery.;  Surgeon: Marty Heck, MD;  [facility];  Service: Vascular;  Laterality: Right;   VIDEO ASSISTED THORACOSCOPY (VATS)/DECORTICATION Right 11/21/2011   drainage of empyema    Family History  Problem Relation Age of Onset   Heart attack Father 20   Alcohol abuse Father    Aneurysm Mother    Stroke Neg Hx    Cancer Neg Hx     Social History:  reports that he quit smoking about 3 years ago. His smoking use included  cigarettes. He has a 5.00 pack-year smoking history. He has never used smokeless tobacco. He reports that he does not drink alcohol and does not use drugs.  Allergies:  Allergies  Allergen Reactions   Dilaudid [Hydromorphone Hcl] Rash   Ramipril Rash and Other (See Comments)    Per patient on R arm and leg only; no angioedema   Tramadol Itching and Rash    Medications: I have reviewed the patient's current medications.  Results for orders placed or performed during the hospital encounter of 04/29/22 (from the past  48 hour(s))  Comprehensive metabolic panel     Status: Abnormal   Collection Time: 04/29/22  9:10 PM  Result Value Ref Range   Sodium 140 135 - 145 mmol/L   Potassium 4.6 3.5 - 5.1 mmol/L   Chloride 106 98 - 111 mmol/L   CO2 21 (L) 22 - 32 mmol/L   Glucose, Bld 87 70 - 99 mg/dL    Comment: Glucose reference range applies only to samples taken after fasting for at least 8 hours.   BUN 17 8 - 23 mg/dL   Creatinine, Ser 1.06 0.61 - 1.24 mg/dL   Calcium 9.0 8.9 - 10.3 mg/dL   Total Protein 7.5 6.5 - 8.1 g/dL   Albumin 3.1 (L) 3.5 - 5.0 g/dL   AST 154 (H) 15 - 41 U/L   ALT 98 (H) 0 - 44 U/L   Alkaline Phosphatase 455 (H) 38 - 126 U/L   Total Bilirubin 0.8 0.3 - 1.2 mg/dL   GFR, Estimated >60 >60 mL/min    Comment: (NOTE) Calculated using the CKD-EPI Creatinine Equation (2021)    Anion gap 13 5 - 15    Comment: Performed at [facility], [place] [address], [place], [place] [address]  CBC     Status: Abnormal   Collection Time: 04/29/22  9:10 PM  Result Value Ref Range   WBC 6.2 4.0 - 10.5 K/uL   RBC 4.78 4.22 - 5.81 MIL/uL   Hemoglobin 12.4 (L) 13.0 - 17.0 g/dL   HCT 42.0 39.0 - 52.0 %   MCV 87.9 80.0 - 100.0 fL   MCH 25.9 (L) 26.0 - 34.0 pg   MCHC 29.5 (L) 30.0 - 36.0 g/dL   RDW 19.7 (H) 11.5 - 15.5 %   Platelets 204 150 - 400 K/uL   nRBC 0.0 0.0 - 0.2 %    Comment: Performed at [facility] [address] [place], [place] [address]  Ethanol     Status: None   Collection Time: 04/29/22  9:10 PM  Result Value Ref Range   Alcohol, Ethyl (B) <10 <10 mg/dL    Comment: (NOTE) Lowest detectable limit for serum alcohol is 10 mg/dL.  For medical purposes only. Performed at [facility], [place] [address] [place], [place] [address]   Protime-INR     Status: Abnormal   Collection Time: 04/29/22  9:10 PM  Result Value Ref Range   Prothrombin Time 16.4 (H) 11.4 - 15.2 seconds   INR 1.3 (H) 0.8 - 1.2    Comment: (NOTE) INR goal varies based on device and disease  states. Performed at [facility], [place] [address], [place], [place] [address]   Sample to Blood Bank     Status: None   Collection Time: 04/29/22  9:10 PM  Result Value Ref Range   Blood Bank Specimen SAMPLE AVAILABLE FOR TESTING    Sample Expiration  04/30/2022,2359 Performed at [facility] [address] [place], [place] [address]   Salicylate level     Status: Abnormal   Collection Time: 04/29/22  9:10 PM  Result Value Ref Range   Salicylate Lvl <4.7 (L) 7.0 - 30.0 mg/dL    Comment: Performed at [facility] [address], [place], [place] [address]  Acetaminophen level     Status: Abnormal   Collection Time: 04/29/22  9:10 PM  Result Value Ref Range   Acetaminophen (Tylenol), Serum <10 (L) 10 - 30 ug/mL    Comment: (NOTE) Therapeutic concentrations vary significantly. A range of 10-30 ug/mL  may be an effective concentration for many patients. However, some  are best treated at concentrations outside of this range. Acetaminophen concentrations >150 ug/mL at 4 hours after ingestion  and >50 ug/mL at 12 hours after ingestion are often associated with  toxic reactions.  Performed at [facility], [place] [address], [place], [place] [address]   Troponin I (High Sensitivity)     Status: Abnormal   Collection Time: 04/29/22  9:10 PM  Result Value Ref Range   Troponin I (High Sensitivity) 37 (H) <18 ng/L    Comment: (NOTE) Elevated high sensitivity troponin I (hsTnI) values and significant  changes across serial measurements may suggest ACS but many other  chronic and acute conditions are known to elevate hsTnI results.  Refer to the "Links" section for chest pain algorithms and additional  guidance. Performed at [facility], [place] [address] [place], [place] [address]   Lactic acid, plasma     Status: Abnormal   Collection Time: 04/29/22  9:13 PM  Result Value Ref Range   Lactic Acid, Venous 3.1 (HH) 0.5 - 1.9 mmol/L    Comment:  CRITICAL RESULT CALLED TO, READ BACK BY AND VERIFIED WITH Assunta Found, RN AT 2230 ON 04/29/22 BY H. HOWARD. Performed at [facility], [place] [address], [place], [place] [address]   Ammonia     Status: None   Collection Time: 04/29/22  9:13 PM  Result Value Ref Range   Ammonia 21 9 - 35 umol/L    Comment: Performed at [facility], [place] [address] [place], [place] [address]  I-Stat Chem 8, ED     Status: Abnormal   Collection Time: 04/29/22  9:18 PM  Result Value Ref Range   Sodium 141 135 - 145 mmol/L   Potassium 4.7 3.5 - 5.1 mmol/L   Chloride 109 98 - 111 mmol/L   BUN 19 8 - 23 mg/dL   Creatinine, Ser 1.00 0.61 - 1.24 mg/dL   Glucose, Bld 85 70 - 99 mg/dL    Comment: Glucose reference range applies only to samples taken after fasting for at least 8 hours.   Calcium, Ion 1.08 (L) 1.15 - 1.40 mmol/L   TCO2 22 22 - 32 mmol/L   Hemoglobin 13.9 13.0 - 17.0 g/dL   HCT 41.0 39.0 - 52.0 %  Troponin I (High Sensitivity)     Status: Abnormal   Collection Time: 04/29/22 11:13 PM  Result Value Ref Range   Troponin I (High Sensitivity) 41 (H) <18 ng/L    Comment: (NOTE) Elevated high sensitivity troponin I (hsTnI) values and significant  changes across serial measurements may suggest ACS but many other  chronic and acute conditions are known to elevate hsTnI results.  Refer to the "Links" section for chest pain algorithms and additional  guidance. Performed  at [facility], [place] [address], [place], [place] [address]   Basic metabolic panel     Status: Abnormal   Collection Time: 04/30/22  4:54 AM  Result Value Ref Range   Sodium 140 135 - 145 mmol/L   Potassium 3.8 3.5 - 5.1 mmol/L   Chloride 110 98 - 111 mmol/L   CO2 22 22 - 32 mmol/L   Glucose, Bld 83 70 - 99 mg/dL    Comment: Glucose reference range applies only to samples taken after fasting for at least 8 hours.   BUN 14 8 - 23 mg/dL   Creatinine, Ser 0.93 0.61 - 1.24 mg/dL   Calcium 8.4 (L) 8.9 - 10.3 mg/dL    GFR, Estimated >60 >60 mL/min    Comment: (NOTE) Calculated using the CKD-EPI Creatinine Equation (2021)    Anion gap 8 5 - 15    Comment: Performed at [facility] [address], [place], [place] [address]  CBC     Status: Abnormal   Collection Time: 04/30/22  4:54 AM  Result Value Ref Range   WBC 5.5 4.0 - 10.5 K/uL   RBC 3.53 (L) 4.22 - 5.81 MIL/uL   Hemoglobin 9.3 (L) 13.0 - 17.0 g/dL    Comment: REPEATED TO VERIFY   HCT 29.8 (L) 39.0 - 52.0 %   MCV 84.4 80.0 - 100.0 fL   MCH 26.3 26.0 - 34.0 pg   MCHC 31.2 30.0 - 36.0 g/dL   RDW 19.7 (H) 11.5 - 15.5 %   Platelets 169 150 - 400 K/uL   nRBC 0.0 0.0 - 0.2 %    Comment: Performed at [facility], [place] [address] [place], [place] [address]  Lactic acid, plasma     Status: None   Collection Time: 04/30/22  4:54 AM  Result Value Ref Range   Lactic Acid, Venous 1.2 0.5 - 1.9 mmol/L    Comment: Performed at [facility] [address], [place], [place] [address]  Urinalysis, Complete w Microscopic     Status: None   Collection Time: 04/30/22  9:27 AM  Result Value Ref Range   Color, Urine YELLOW YELLOW   APPearance CLEAR CLEAR   Specific Gravity, Urine 1.017 1.005 - 1.030   pH 6.0 5.0 - 8.0   Glucose, UA NEGATIVE NEGATIVE mg/dL   Hgb urine dipstick NEGATIVE NEGATIVE   Bilirubin Urine NEGATIVE NEGATIVE   Ketones, ur NEGATIVE NEGATIVE mg/dL   Protein, ur NEGATIVE NEGATIVE mg/dL   Nitrite NEGATIVE NEGATIVE   Leukocytes,Ua NEGATIVE NEGATIVE   RBC / HPF 0-5 0 - 5 RBC/hpf   WBC, UA 0-5 0 - 5 WBC/hpf   Bacteria, UA NONE SEEN NONE SEEN   Mucus PRESENT     Comment: Performed at [facility], [place] [address], [place],  [address]  Rapid urine drug screen (hospital performed)     Status: Abnormal   Collection Time: 04/30/22  9:27 AM  Result Value Ref Range   Opiates POSITIVE (A) NONE DETECTED   Cocaine NONE DETECTED NONE DETECTED   Benzodiazepines NONE DETECTED NONE DETECTED   Amphetamines NONE  DETECTED NONE DETECTED   Tetrahydrocannabinol NONE DETECTED NONE DETECTED   Barbiturates NONE DETECTED NONE DETECTED    Comment: (NOTE) DRUG SCREEN FOR MEDICAL PURPOSES ONLY.  IF CONFIRMATION IS NEEDED FOR ANY PURPOSE, NOTIFY LAB WITHIN 5 DAYS.  LOWEST DETECTABLE LIMITS FOR URINE DRUG SCREEN Drug Class  Cutoff (ng/mL) Amphetamine and metabolites    1000 Barbiturate and metabolites    200 Benzodiazepine                 200 Opiates and metabolites        300 Cocaine and metabolites        300 THC                            50 Performed at [facility], [place] [address] [place], [place] [address]     CT Maxillofacial Wo Contrast  Result Date: 04/29/2022 CLINICAL DATA:  Fall today with blunt facial trauma. EXAM: CT MAXILLOFACIAL WITHOUT CONTRAST TECHNIQUE: Multidetector CT imaging of the maxillofacial structures was performed. Multiplanar CT image reconstructions were also generated. RADIATION DOSE REDUCTION: This exam was performed according to the departmental dose-optimization program which includes automated exposure control, adjustment of the mA and/or kV according to patient size and/or use of iterative reconstruction technique. COMPARISON:  Head CT earlier today. FINDINGS: Osseous: Acute nondisplaced fracture of the right zygomatic arch. No acute fracture of the nasal bones or mandibles. In intact left zygomatic arch. Patient is edentulous. The temporomandibular joints are congruent. Patient is edentulous. Intact pterygoid plates. Orbits: Acute right orbital fractures involve the inferior and lateral orbital walls. Lateral wall fracture is comminuted and displaced. The inferior wall fracture is mildly displaced. No evidence of globe injury. No extraocular muscle entrapment. Intact left orbit and globe. Sinuses: Comminuted and mildly displaced fractures of the right maxillary sinus, involving anterior, lateral, superior and medial walls. Right maxillary hemosinus.  Mucosal thickening throughout right ethmoid air cells. Soft tissues: Right cheek soft tissue edema. Right supraorbital soft tissue hematoma. Small amount of subcutaneous emphysema related to maxillary sinus fracture Limited intracranial: Assessed on head CT earlier today. No hemorrhage. IMPRESSION: 1. Acute right orbital fractures involve the inferior and lateral orbital walls. Lateral wall fracture is comminuted and displaced. The inferior wall fracture is mildly displaced. No evidence of globe injury or extraocular muscle entrapment. 2. Comminuted and mildly displaced fractures of the right maxillary sinus, involving anterior, lateral, superior and medial walls. Right maxillary hemosinus. 3. Acute nondisplaced fracture of the right zygomatic arch. Electronically Signed   By: Keith Rake M.D.   On: 04/29/2022 23:09   CT HEAD WO CONTRAST  Result Date: 04/29/2022 CLINICAL DATA:  Head trauma after fall EXAM: CT HEAD WITHOUT CONTRAST CT CERVICAL SPINE WITHOUT CONTRAST TECHNIQUE: Multidetector CT imaging of the head and cervical spine was performed following the standard protocol without intravenous contrast. Multiplanar CT image reconstructions of the cervical spine were also generated. RADIATION DOSE REDUCTION: This exam was performed according to the departmental dose-optimization program which includes automated exposure control, adjustment of the mA and/or kV according to patient size and/or use of iterative reconstruction technique. COMPARISON:  CT head 03/11/2022 FINDINGS: CT HEAD FINDINGS Brain: No intracranial hemorrhage, mass effect, or evidence of acute infarct. No hydrocephalus. No extra-axial fluid collection. Generalized cerebral atrophy. Ill-defined hypoattenuation within the cerebral white matter is nonspecific but consistent with chronic small vessel ischemic disease. Right cerebellar chronic infarcts. Vascular: No hyperdense vessel. Intracranial arterial calcification. Skull: No skull  fracture. Sinuses/Orbits: Acute mildly displaced fractures of the right lateral and inferior orbital walls. No definite entrapment. Acute mildly displaced comminuted fractures of the anterior and posterior right maxillary sinus walls. Frothy fluid with air-fluid level in the right maxillary sinus likely due to hemosinus. The globes are  intact. Other: Large soft tissue hematoma lateral to the right orbit. CT CERVICAL SPINE FINDINGS Alignment: Normal. Skull base and vertebrae: No acute fracture. No primary bone lesion or focal pathologic process. Soft tissues and spinal canal: No prevertebral fluid or swelling. No visible canal hematoma. Disc levels: Multilevel spondylosis and disc space height loss greatest at C5-C7 where it is moderate. Posterior disc osteophyte complexes at C5-C6 and C6-C7 cause mild effacement of the ventral thecal sac. No high-grade spinal canal narrowing. Uncovertebral spurring and facet arthropathy cause advanced neural foraminal narrowing bilaterally at C5-C6 at C6-C7. Upper chest: No acute abnormality. Other: Carotid bulb calcifications. IMPRESSION: 1. Acute comminuted displaced fractures of the right inferior and lateral orbital walls and anterior and posterior right maxillary sinus walls. The inferior aspect of the maxillary sinuses is not included in the exam. Consider maxillofacial CT for further evaluation. 2. No acute intracranial hemorrhage. 3. No cervical spine fracture. Electronically Signed   By: Placido Sou M.D.   On: 04/29/2022 22:00   CT CERVICAL SPINE WO CONTRAST  Result Date: 04/29/2022 CLINICAL DATA:  Head trauma after fall EXAM: CT HEAD WITHOUT CONTRAST CT CERVICAL SPINE WITHOUT CONTRAST TECHNIQUE: Multidetector CT imaging of the head and cervical spine was performed following the standard protocol without intravenous contrast. Multiplanar CT image reconstructions of the cervical spine were also generated. RADIATION DOSE REDUCTION: This exam was performed according to  the departmental dose-optimization program which includes automated exposure control, adjustment of the mA and/or kV according to patient size and/or use of iterative reconstruction technique. COMPARISON:  CT head 03/11/2022 FINDINGS: CT HEAD FINDINGS Brain: No intracranial hemorrhage, mass effect, or evidence of acute infarct. No hydrocephalus. No extra-axial fluid collection. Generalized cerebral atrophy. Ill-defined hypoattenuation within the cerebral white matter is nonspecific but consistent with chronic small vessel ischemic disease. Right cerebellar chronic infarcts. Vascular: No hyperdense vessel. Intracranial arterial calcification. Skull: No skull fracture. Sinuses/Orbits: Acute mildly displaced fractures of the right lateral and inferior orbital walls. No definite entrapment. Acute mildly displaced comminuted fractures of the anterior and posterior right maxillary sinus walls. Frothy fluid with air-fluid level in the right maxillary sinus likely due to hemosinus. The globes are intact. Other: Large soft tissue hematoma lateral to the right orbit. CT CERVICAL SPINE FINDINGS Alignment: Normal. Skull base and vertebrae: No acute fracture. No primary bone lesion or focal pathologic process. Soft tissues and spinal canal: No prevertebral fluid or swelling. No visible canal hematoma. Disc levels: Multilevel spondylosis and disc space height loss greatest at C5-C7 where it is moderate. Posterior disc osteophyte complexes at C5-C6 and C6-C7 cause mild effacement of the ventral thecal sac. No high-grade spinal canal narrowing. Uncovertebral spurring and facet arthropathy cause advanced neural foraminal narrowing bilaterally at C5-C6 at C6-C7. Upper chest: No acute abnormality. Other: Carotid bulb calcifications. IMPRESSION: 1. Acute comminuted displaced fractures of the right inferior and lateral orbital walls and anterior and posterior right maxillary sinus walls. The inferior aspect of the maxillary sinuses is  not included in the exam. Consider maxillofacial CT for further evaluation. 2. No acute intracranial hemorrhage. 3. No cervical spine fracture. Electronically Signed   By: Placido Sou M.D.   On: 04/29/2022 22:00   DG Pelvis Portable  Result Date: 04/29/2022 CLINICAL DATA:  Trauma EXAM: PORTABLE PELVIS 1-2 VIEWS COMPARISON:  None Available. FINDINGS: No fracture or dislocation is seen. Bilateral hip joint spaces are preserved. Visualized bony pelvis appears intact. L5-S1 fixation hardware. IMPRESSION: Negative. Electronically Signed   By: Henderson Newcomer.D.  On: 04/29/2022 21:50   DG Chest Port 1 View  Result Date: 04/29/2022 CLINICAL DATA:  Trauma EXAM: PORTABLE CHEST 1 VIEW COMPARISON:  04/09/2022 FINDINGS: Lungs are clear.  No pleural effusion or pneumothorax. The heart is top-normal in size.  Thoracic aortic atherosclerosis. Right chest power port terminates at the cavoatrial junction. IMPRESSION: No evidence of acute cardiopulmonary disease. Electronically Signed   By: Julian Hy M.D.   On: 04/29/2022 21:50    ROS Blood pressure 130/71, pulse (!) 115, temperature 98.4 F (36.9 C), temperature source Oral, resp. rate (!) 28, height '5\' 7"'$  (1.702 m), weight 72.6 kg, SpO2 100 %. Physical Exam  Assessment/Plan: On physical exam the patient seems somewhat disoriented however is easily reoriented to at least his physical exam. Right periorbital soft tissues: Patient has ecchymoses and edema of the right periorbital region.  The eye is still open and visible. Extraocular movements: The patient has full range of motion in both eyes.  There is no indication of entrapment. Occlusion: The patient is edentulous so occlusion is difficult to assess. Facial sensation: The patient states he has normal feeling along both the lateral and inferior portion of the right orbit and along the mandible on the right when compared to the left side of the face. Maxillofacial CT: As noted in the report  the patient does have fractures of the lateral and inferior orbital rim.  There is minimal displacement of the fractures.  He also has anterior maxillary sinus fractures.  And a nondisplaced ZMC fracture.  Assessment: Facial fractures.  I do not believe that these facial fractures warrant surgical intervention.  If the patient is discharged he may follow-up next week in my clinic at [facility] [address], [address].  Camillia Herter, MD 04/30/2022, 10:16 AM

## 2022-04-30 NOTE — ED Notes (Signed)
Spoke with Network engineer and requested admin provider to be paged

## 2022-05-01 ENCOUNTER — Telehealth: Payer: Self-pay

## 2022-05-01 ENCOUNTER — Inpatient Hospital Stay (HOSPITAL_COMMUNITY): Payer: Medicare Other

## 2022-05-01 ENCOUNTER — Other Ambulatory Visit: Payer: Self-pay

## 2022-05-01 DIAGNOSIS — J150 Pneumonia due to Klebsiella pneumoniae: Secondary | ICD-10-CM | POA: Diagnosis not present

## 2022-05-01 DIAGNOSIS — Y92009 Unspecified place in unspecified non-institutional (private) residence as the place of occurrence of the external cause: Secondary | ICD-10-CM | POA: Diagnosis not present

## 2022-05-01 DIAGNOSIS — I10 Essential (primary) hypertension: Secondary | ICD-10-CM | POA: Diagnosis not present

## 2022-05-01 DIAGNOSIS — G9340 Encephalopathy, unspecified: Secondary | ICD-10-CM

## 2022-05-01 DIAGNOSIS — Z87891 Personal history of nicotine dependence: Secondary | ICD-10-CM

## 2022-05-01 DIAGNOSIS — Z0189 Encounter for other specified special examinations: Secondary | ICD-10-CM | POA: Diagnosis not present

## 2022-05-01 DIAGNOSIS — W19XXXA Unspecified fall, initial encounter: Secondary | ICD-10-CM | POA: Diagnosis not present

## 2022-05-01 LAB — CBC
HCT: 32.5 % — ABNORMAL LOW (ref 39.0–52.0)
HCT: 31.5 % — ABNORMAL LOW (ref 39.0–52.0)
HCT: 28.9 % — ABNORMAL LOW (ref 39.0–52.0)
Hemoglobin: 10.3 g/dL — ABNORMAL LOW (ref 13.0–17.0)
Hemoglobin: 9.8 g/dL — ABNORMAL LOW (ref 13.0–17.0)
Hemoglobin: 9.1 g/dL — ABNORMAL LOW (ref 13.0–17.0)
MCH: 26.1 pg (ref 26.0–34.0)
MCH: 25.9 pg — ABNORMAL LOW (ref 26.0–34.0)
MCH: 25.9 pg — ABNORMAL LOW (ref 26.0–34.0)
MCHC: 31.7 g/dL (ref 30.0–36.0)
MCHC: 31.1 g/dL (ref 30.0–36.0)
MCHC: 31.5 g/dL (ref 30.0–36.0)
MCV: 82.5 fL (ref 80.0–100.0)
MCV: 83.3 fL (ref 80.0–100.0)
MCV: 82.3 fL (ref 80.0–100.0)
Platelets: 145 10*3/uL — ABNORMAL LOW (ref 150–400)
Platelets: 110 10*3/uL — ABNORMAL LOW (ref 150–400)
Platelets: 105 10*3/uL — ABNORMAL LOW (ref 150–400)
RBC: 3.94 MIL/uL — ABNORMAL LOW (ref 4.22–5.81)
RBC: 3.78 MIL/uL — ABNORMAL LOW (ref 4.22–5.81)
RBC: 3.51 MIL/uL — ABNORMAL LOW (ref 4.22–5.81)
RDW: 20 % — ABNORMAL HIGH (ref 11.5–15.5)
RDW: 20.2 % — ABNORMAL HIGH (ref 11.5–15.5)
RDW: 20 % — ABNORMAL HIGH (ref 11.5–15.5)
WBC: 11.8 10*3/uL — ABNORMAL HIGH (ref 4.0–10.5)
WBC: 9.1 10*3/uL (ref 4.0–10.5)
WBC: 8.3 10*3/uL (ref 4.0–10.5)
nRBC: 0 % (ref 0.0–0.2)
nRBC: 0 % (ref 0.0–0.2)
nRBC: 0 % (ref 0.0–0.2)

## 2022-05-01 LAB — COMPREHENSIVE METABOLIC PANEL
ALT: 43 U/L (ref 0–44)
AST: 45 U/L — ABNORMAL HIGH (ref 15–41)
Albumin: 2.3 g/dL — ABNORMAL LOW (ref 3.5–5.0)
Alkaline Phosphatase: 285 U/L — ABNORMAL HIGH (ref 38–126)
Anion gap: 11 (ref 5–15)
BUN: 18 mg/dL (ref 8–23)
CO2: 21 mmol/L — ABNORMAL LOW (ref 22–32)
Calcium: 8.1 mg/dL — ABNORMAL LOW (ref 8.9–10.3)
Chloride: 106 mmol/L (ref 98–111)
Creatinine, Ser: 0.96 mg/dL (ref 0.61–1.24)
GFR, Estimated: >60 mL/min (ref >60)
Glucose, Bld: 86 mg/dL (ref 70–99)
Potassium: 3.6 mmol/L (ref 3.5–5.1)
Sodium: 138 mmol/L (ref 135–145)
Total Bilirubin: 0.8 mg/dL (ref 0.3–1.2)
Total Protein: 6.2 g/dL — ABNORMAL LOW (ref 6.5–8.1)

## 2022-05-01 LAB — ECHOCARDIOGRAM COMPLETE
AR max vel: 2.64 cm2
AV Area VTI: 2.69 cm2
AV Area mean vel: 2.79 cm2
AV Mean grad: 5.7 mmHg
AV Peak grad: 10.8 mmHg
Ao pk vel: 1.64 m/s
Height: 67 in
S' Lateral: 3.2 cm
Weight: 2370.39 oz

## 2022-05-01 LAB — BASIC METABOLIC PANEL
Anion gap: 12 (ref 5–15)
BUN: 18 mg/dL (ref 8–23)
CO2: 21 mmol/L — ABNORMAL LOW (ref 22–32)
Calcium: 8.4 mg/dL — ABNORMAL LOW (ref 8.9–10.3)
Chloride: 105 mmol/L (ref 98–111)
Creatinine, Ser: 1.16 mg/dL (ref 0.61–1.24)
GFR, Estimated: >60 mL/min (ref >60)
Glucose, Bld: 86 mg/dL (ref 70–99)
Potassium: 4.3 mmol/L (ref 3.5–5.1)
Sodium: 138 mmol/L (ref 135–145)

## 2022-05-01 LAB — BRAIN NATRIURETIC PEPTIDE: B Natriuretic Peptide: 430.5 pg/mL — ABNORMAL HIGH (ref 0.0–100.0)

## 2022-05-01 LAB — MAGNESIUM: Magnesium: 1.5 mg/dL — ABNORMAL LOW (ref 1.7–2.4)

## 2022-05-01 MED ORDER — MAGNESIUM SULFATE 2 GM/50ML IV SOLN
2.0000 g | Freq: Once | INTRAVENOUS | Status: AC
  Administered 2022-05-01: 2 g via INTRAVENOUS
  Filled 2022-05-01: qty 50

## 2022-05-01 MED ORDER — CHLORHEXIDINE GLUCONATE CLOTH 2 % EX PADS
6.0000 | MEDICATED_PAD | Freq: Every day | CUTANEOUS | Status: DC
  Administered 2022-05-01 – 2022-05-04 (×4): 6 via TOPICAL

## 2022-05-01 MED ORDER — MAGNESIUM SULFATE 4 GM/100ML IV SOLN
4.0000 g | Freq: Once | INTRAVENOUS | Status: AC
  Administered 2022-05-01: 4 g via INTRAVENOUS
  Filled 2022-05-01: qty 100

## 2022-05-01 MED ORDER — SODIUM CHLORIDE 0.9% FLUSH
10.0000 mL | Freq: Two times a day (BID) | INTRAVENOUS | Status: DC
  Administered 2022-05-02 – 2022-05-04 (×4): 10 mL

## 2022-05-01 MED ORDER — IPRATROPIUM-ALBUTEROL 0.5-2.5 (3) MG/3ML IN SOLN: 3.0000 mL | RESPIRATORY_TRACT | Status: DC | PRN

## 2022-05-01 MED ORDER — PANTOPRAZOLE SODIUM 40 MG IV SOLR
40.0000 mg | Freq: Two times a day (BID) | INTRAVENOUS | Status: DC
  Administered 2022-05-01 – 2022-05-03 (×5): 40 mg via INTRAVENOUS
  Filled 2022-05-01 (×5): qty 10

## 2022-05-01 MED ORDER — SODIUM CHLORIDE 0.9% FLUSH
10.0000 mL | INTRAVENOUS | Status: DC | PRN
  Administered 2022-05-04: 10 mL

## 2022-05-01 MED ORDER — PERFLUTREN LIPID MICROSPHERE
1.0000 mL | INTRAVENOUS | Status: AC | PRN
  Administered 2022-05-01: 2 mL via INTRAVENOUS

## 2022-05-01 NOTE — Progress Notes (Signed)
Received a call from bedside RN regarding the patient's lack of orientation when previously alert and on oriented x3 during dayshift.    Presented at bedside, the patient is alert, oriented to self, and place only initially.  When reassessed with his nurse at bedside, he was unable to respond to questions appropriately.  The patient is on Eliquis for history of multiple thromboembolic events DVT/PE.  Repeated a CT head without contrast to rule out any intracranial hemorrhage.    Repeat CT head was nonacute.  It revealed old left PCA infarct and old cerebellar lacunar infarcts.  Additionally, the patient had 3 beats and 5 beats runs of nonsustained V. Tach with frequent PVCs.  A twelve-lead EKG was obtained.  This showed sinus rhythm with rate of 100, frequent, consecutive PVCs.  QTc 495.  2 g of IV magnesium ordered and labs repeated.  We will continue to closely monitor and treat as indicated.   Time spent: 35 minutes.

## 2022-05-01 NOTE — Hospital Course (Addendum)
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   E coli bacteremia of unclear source 9/16.  Per patient's family friend he is alert and oriented x4 at baseline. Unfortunately he is alert and oriented to self only at the time of my exam and not able to provide much history.   History primarily obtained from EMR.   Patient was admitted 11/4 (2d ago) after he fell from his wheelchair. He reportedly was able to get himself back in his wheelchair as EMS found him sitting in his wheel chair. Patient possibly had chest pain before his fall but it is not clear if experienced loss of consciousness.   Work up thus far was revealing for R orbital fractures, R maxillary sinus fractures, and fracture of R zygmatic arch. Patient was also noted to be in ventricular bigeminy on admission ECG with elevated troponin. The on call cardiologist was contacted who recommended no acute intervention. Patient also developed a fever up to 102.40F as well as confusion. His infectious work up was notable for elevated procalcitonin with no definitive consolidation or opacity on CXR. UA not c/f infection. Mild leukocytosis to 11.8. And Blood cultures + for klebsiella pneumonia. Patient was started on Ceftriaxone 11/5. Of note, his course has been complicated by acute change in his mentation early this AM (11/6). Patient underwent at ct head without contrast which showed no acute intracranial abnormalities. His EKG continued to show frequent PVCs. He was given IV magnesium for his persistent arrhythmia. The etiology of his confusion was unclear. He had received 3 doses of oxycodone over the last 24 hours and a 1x dose of haldol. He does have mildly  decreased albumin (3.1) and mildly elevated PT/INR (though this is on eliquis).    Of note, patient has had E. Coli bacteremia of unclear source during hospitalization 03/11/2022. He had his liver mass biopsied and it was felt that this was likely a metastasis from his esophageal malignancy.   Patient also noted to have recently started chemotherapy.   Trastuzumab, and pembrolizumab     Oxaliplatin      Leucovorin      Fluorouracil      Fluorouracil. This was given 04/06/2022.  He developed chest pain and N/V and was diagnosed with NSTEMI 10/15. 5FU and trastuzumab were subsequently held as it was thought they could have potentially contributed to his NSTEMI.   He underwent chemo infusion 04/18/22 and again 04/25/2022.      11-7: Patient reports feeling okay this morning, oriented to person-place and T+61yr Aware that he sustained a fall, but cannot remember the actual even or how it happened. Denies hematemesis, headache, and abdominal pain. Produced a bowel movement and never checked the color. Discussed liver lesion and possibility of draining it, the interventional radiology team will provide their opinion as well. Patient expressed understanding of and agreement with this plan.  11-8: Patient reports feeling okay this morning. Produced one bowel movement yesterday and denies hematochezia or melena. Communicated plan for physical and occupational therapy evaluation later today. Discussed treatment with antibiotics and reason for recurrent infections. Fractures in facial bones will heal without further intervention. Patient expressed understanding of and agreement with this plan.

## 2022-05-01 NOTE — Progress Notes (Signed)
  Echocardiogram 2D Echocardiogram has been performed.  [name] [name] 05/01/2022, 4:09 PM

## 2022-05-01 NOTE — Progress Notes (Addendum)
Subjective:   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   E coli bacteremia of unclear source 9/16.  Per patient's family friend he is alert and oriented x4 at baseline. Unfortunately he is alert and oriented to self only at the time of my exam and not able to provide much history.   History primarily obtained from EMR.   Patient was admitted 11/4 (2d ago) after he fell from his wheelchair. He reportedly was able to get himself back in his wheelchair as EMS found him sitting in his wheel chair. Patient possibly had chest pain before his fall but it is not clear if experienced loss of consciousness.   Work up thus far was revealing for R orbital fractures, R maxillary sinus fractures, and fracture of R zygmatic arch. Patient was also noted to be in ventricular bigeminy on admission ECG with elevated troponin. The on call cardiologist was contacted who recommended no acute intervention. Patient also developed a fever up to 102.4F as well as confusion. His infectious work up was notable for elevated procalcitonin with no definitive consolidation or opacity on CXR. UA not c/f infection. Mild leukocytosis to 11.8. And Blood cultures + for klebsiella pneumonia. Patient was started on Ceftriaxone 11/5.    Of note, patient has had E. Coli bacteremia of unclear source during hospitalization 03/11/2022. He had his liver mass biopsied and it was felt that this was likely a metastasis from his esophageal malignancy.   Patient also noted to have recently started chemotherapy.   Trastuzumab, and pembrolizumab     Oxaliplatin      Leucovorin      Fluorouracil      Fluorouracil. This was given 04/06/2022.  He developed  chest pain and N/V and was diagnosed with NSTEMI 10/15. 5FU and trastuzumab were subsequently held.   He underwent chemo infusion 04/18/22 and again 04/25/2022.   Objective:  Vital signs in last 24 hours: Vitals:   05/01/22 0040 05/01/22 0157 05/01/22 0357 05/01/22 0813  BP:   104/65 (!) 108/48  Pulse:   82 (!) 108  Resp:   (!) 21 (!) 21  Temp: 99.7 F (37.6 C) 98.5 F (36.9 C) 98 F (36.7 C) 99.5 F (37.5 C)  TempSrc: Oral Oral Oral Oral  SpO2:   95% 95%  Weight:   67.2 kg   Height:        Constitutional: Chronically ill appearing and in no distress.  HENT:  Head: Normocephalic and atraumatic.  Eyes: EOM are normal. R Periorbital ecchymosis, small lateral laceration with no purulent drainage sutures in tact. Neck: Normal range of motion.  Cardiovascular: tachycardic rate, regular rhythm, intact distal pulses. No gallop and no friction rub.  No murmur heard. No lower extremity edema  Pulmonary: Non labored breathing on room air, no wheezing or rales  Abdominal: Soft. Normal bowel sounds. Non distended and non tender Musculoskeletal: s/p L AKA  Neurological: Alert and oriented to person only. Followed simple  Skin: Skin is warm and dry.    Assessment/Plan:  Principal Problem:   Fall at home, initial encounter Active Problems:   Chronic pain syndrome   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), left ([place])  SIRS, possible sepsis (systemic inflammatory response syndrome) (HCC)   Cancer of esophagus (HCC)   History of pulmonary embolism   Elevated troponin   Ventricular bigeminy   Orbital wall fracture, closed, initial encounter ([place])   Multiple closed facial bone fractures, initial encounter [employer])   CAD s/p CABG 2020, STEMI 01/2022   Abnormal LFTs  [name] is a 61 yo M with a PMH of CAD s/p MI c/b Vfib cardiac arrest 01/31/2022 LHC 100% occluded prox RCA w/o intervention, NSTEMI (type 2) (04/09/2022), PE/DVT on chronic AC (01/2022, 12/2021) c/b chronic RV  dysfxn, PAD s/p L AKA (04/2020), metastatic adenocarcinoma likely esophageal primary, CVA (02/2022), COPD, HTN, and HLD who presented after a fall of unclear etiology who was found to be febrile up to 102.89F, mild leukocytosis, and Bcx positive for klebsiella pna. He also had acute encephalopathy of unclear etiology on HD 1.    #Encephalopathy, likely in the setting of infection  Patient became encephalopathic overnight. He underwent a CT of the head which revealed no acute abnormality. He also had no metabolic derangements. Patient did have positive blood cultures (1/4) which grew klebsiella pneumoniae. Finally, patient did receive oxycodone x4 doses over the last 24 hours and 1x dose of haldol.   Suspect his encephalopathy is multifactorial in etiology from his infection and from centrally acting medications that may not be completely cleared in the setting of mild liver dysfunction.   Plan: Limit opioid use, if requires pain medication start opioids at lowest dose  Continue ceftriaxone for klebsiella pneumonia bacteremia    #Klebsiella pna bacteremia (1/4) Bcx Unclear source. Patient's urinalysis with no evidence of infection. Patient without opacity on chest x ray. He does have known esophageal mass and liver mass. He had e coli bacteremia 9/16 and liver mass was biopsied which showed necrotic tissue only. Cultures were negative and gram stain showed no WBCs or organisms. Upon speaking with ID however they felt it is still possible that this could be the source of patient's GNR bacteremia.  -Continue antibiotics for bacteremia  -Consider re consulting IR for biopsy and culture of liver mass  #Fall c/b facial fractures Unclear if patient experienced syncopal episode or had mechanical fall. He was noted to have ventricular bigeminy and ventricular trigeminy on admission. Of note, this AM patient also noted to have hematemesis. He did cough up significant amount of sputum after this incident which  had no blood tinge to it. A stat CBC showed his hgb was stable and his vitals (though tachycardic), patient was normotensive. Suspect hematemesis is related to drainage from his maxillary sinus fractures.  -Nothing to do per plastic surgery for facial fractures  -Management of ventricular bigeminy per below  -If increased bleeding would consult GI for possible bleeding from esophageal mass (no evidence of portal hypertension from liver mass)   #Abnormal LFTs AST/ALT 1.5/1 and alphos increased from most recent admission. T bili is within normal limits. Likely related to patient's large liver met.  -Continue to trend CMP -Will hold off on repeat imaging of patient's liver  #CAD s/p MI c/b vfib cardiac arrest, NSTEMI  Ventricular bigeminy/trigeminy -Continue metoprolol  -Keep Mg >2, K>4  #Likely metastatic esophageal adenocarcinoma, large met to liver -NTD while inpatient, patient receiving chemotherapy per above   #DVT/PE (12/2021, 01/2022) on eliquis c/b RV dysfunction  In the setting of his malignancy, on eliquis. Had some bleeding, possible hematemesis this AM. Will hold eliquis and continue to monitor his CBC.   #PAD  s/p L AKA Continue statin, plavix  #Normocytic Anemia  #Thrombocytopenia  Patient likely with anemia of chronic disease in the setting of his malignancy. Also noted to have thrombocytopenia this hospitalization likely in the setting of his current infection.  -Follow up iron studies  Dispo: Anticipated discharge in approximately 5 day(s).   Rick Duff, MD 05/01/2022, 11:30 AM After 5pm on weekdays and 1pm on weekends: On Call pager [phone]

## 2022-05-01 NOTE — Telephone Encounter (Signed)
[name] [name] called to report [name] [name] was in the hospital. I called [name] [name] to let him know we are aware [name] [name] is in the hospital and Dr. Benay Spice will be by tomorrow  morning.

## 2022-05-02 ENCOUNTER — Inpatient Hospital Stay: Payer: Medicare Other | Admitting: Nurse Practitioner

## 2022-05-02 ENCOUNTER — Inpatient Hospital Stay: Payer: Medicare Other

## 2022-05-02 ENCOUNTER — Inpatient Hospital Stay (HOSPITAL_COMMUNITY): Payer: Medicare Other

## 2022-05-02 DIAGNOSIS — S0285XA Fracture of orbit, unspecified, initial encounter for closed fracture: Secondary | ICD-10-CM

## 2022-05-02 DIAGNOSIS — B961 Klebsiella pneumoniae [K. pneumoniae] as the cause of diseases classified elsewhere: Secondary | ICD-10-CM

## 2022-05-02 DIAGNOSIS — I252 Old myocardial infarction: Secondary | ICD-10-CM

## 2022-05-02 DIAGNOSIS — R7881 Bacteremia: Secondary | ICD-10-CM | POA: Diagnosis not present

## 2022-05-02 DIAGNOSIS — W19XXXA Unspecified fall, initial encounter: Secondary | ICD-10-CM | POA: Diagnosis not present

## 2022-05-02 DIAGNOSIS — C787 Secondary malignant neoplasm of liver and intrahepatic bile duct: Secondary | ICD-10-CM

## 2022-05-02 DIAGNOSIS — C801 Malignant (primary) neoplasm, unspecified: Secondary | ICD-10-CM

## 2022-05-02 DIAGNOSIS — Y92009 Unspecified place in unspecified non-institutional (private) residence as the place of occurrence of the external cause: Secondary | ICD-10-CM | POA: Diagnosis not present

## 2022-05-02 DIAGNOSIS — C159 Malignant neoplasm of esophagus, unspecified: Secondary | ICD-10-CM

## 2022-05-02 LAB — COMPREHENSIVE METABOLIC PANEL
ALT: 31 U/L (ref 0–44)
AST: 32 U/L (ref 15–41)
Albumin: 2 g/dL — ABNORMAL LOW (ref 3.5–5.0)
Alkaline Phosphatase: 241 U/L — ABNORMAL HIGH (ref 38–126)
Anion gap: 7 (ref 5–15)
BUN: 19 mg/dL (ref 8–23)
CO2: 22 mmol/L (ref 22–32)
Calcium: 8 mg/dL — ABNORMAL LOW (ref 8.9–10.3)
Chloride: 108 mmol/L (ref 98–111)
Creatinine, Ser: 0.9 mg/dL (ref 0.61–1.24)
GFR, Estimated: >60 mL/min (ref >60)
Glucose, Bld: 136 mg/dL — ABNORMAL HIGH (ref 70–99)
Potassium: 3.3 mmol/L — ABNORMAL LOW (ref 3.5–5.1)
Sodium: 137 mmol/L (ref 135–145)
Total Bilirubin: 0.5 mg/dL (ref 0.3–1.2)
Total Protein: 5.7 g/dL — ABNORMAL LOW (ref 6.5–8.1)

## 2022-05-02 LAB — CBC
HCT: 25.6 % — ABNORMAL LOW (ref 39.0–52.0)
HCT: 27 % — ABNORMAL LOW (ref 39.0–52.0)
Hemoglobin: 8.3 g/dL — ABNORMAL LOW (ref 13.0–17.0)
Hemoglobin: 8.4 g/dL — ABNORMAL LOW (ref 13.0–17.0)
MCH: 26.5 pg (ref 26.0–34.0)
MCH: 25.8 pg — ABNORMAL LOW (ref 26.0–34.0)
MCHC: 32.4 g/dL (ref 30.0–36.0)
MCHC: 31.1 g/dL (ref 30.0–36.0)
MCV: 81.8 fL (ref 80.0–100.0)
MCV: 82.8 fL (ref 80.0–100.0)
Platelets: 117 10*3/uL — ABNORMAL LOW (ref 150–400)
Platelets: 119 10*3/uL — ABNORMAL LOW (ref 150–400)
RBC: 3.13 MIL/uL — ABNORMAL LOW (ref 4.22–5.81)
RBC: 3.26 MIL/uL — ABNORMAL LOW (ref 4.22–5.81)
RDW: 20 % — ABNORMAL HIGH (ref 11.5–15.5)
RDW: 19.9 % — ABNORMAL HIGH (ref 11.5–15.5)
WBC: 7.6 10*3/uL (ref 4.0–10.5)
WBC: 7 10*3/uL (ref 4.0–10.5)
nRBC: 0 % (ref 0.0–0.2)
nRBC: 0 % (ref 0.0–0.2)

## 2022-05-02 LAB — VITAMIN B12: Vitamin B-12: 1287 pg/mL — ABNORMAL HIGH (ref 180–914)

## 2022-05-02 LAB — CULTURE, BLOOD (ROUTINE X 2)

## 2022-05-02 LAB — IRON AND TIBC
Iron: 12 ug/dL — ABNORMAL LOW (ref 45–182)
Saturation Ratios: 6 % — ABNORMAL LOW (ref 17.9–39.5)
TIBC: 207 ug/dL — ABNORMAL LOW (ref 250–450)
UIBC: 195 ug/dL

## 2022-05-02 LAB — GLUCOSE, CAPILLARY: Glucose-Capillary: 81 mg/dL (ref 70–99)

## 2022-05-02 LAB — FERRITIN: Ferritin: 360 ng/mL — ABNORMAL HIGH (ref 24–336)

## 2022-05-02 MED ORDER — CEFAZOLIN SODIUM-DEXTROSE 2-4 GM/100ML-% IV SOLN
2.0000 g | Freq: Three times a day (TID) | INTRAVENOUS | Status: DC
  Administered 2022-05-02 – 2022-05-04 (×7): 2 g via INTRAVENOUS
  Filled 2022-05-02 (×9): qty 100

## 2022-05-02 MED ORDER — IOHEXOL 350 MG/ML SOLN
75.0000 mL | Freq: Once | INTRAVENOUS | Status: AC | PRN
  Administered 2022-05-02: 75 mL via INTRAVENOUS

## 2022-05-02 MED ORDER — MAGNESIUM SULFATE 4 GM/100ML IV SOLN
4.0000 g | Freq: Once | INTRAVENOUS | Status: AC
  Administered 2022-05-02: 4 g via INTRAVENOUS
  Filled 2022-05-02: qty 100

## 2022-05-02 MED ORDER — OXYCODONE HCL 5 MG PO TABS
5.0000 mg | ORAL_TABLET | Freq: Once | ORAL | Status: AC
  Administered 2022-05-02: 5 mg via ORAL
  Filled 2022-05-02: qty 1

## 2022-05-02 MED ORDER — POTASSIUM CHLORIDE 10 MEQ/100ML IV SOLN
10.0000 meq | INTRAVENOUS | Status: AC
  Administered 2022-05-02 (×4): 10 meq via INTRAVENOUS
  Filled 2022-05-02 (×4): qty 100

## 2022-05-02 NOTE — Progress Notes (Signed)
IP PROGRESS NOTE  Subjective:   [name] [name] is known to me with a history of metastatic adenocarcinoma, likely of esophageal origin.  He has completed 2 cycles of systemic therapy, last on 04/18/2022.  Trastuzumab and 5-FU were held with cycle 2 after an NSTEMI following cycle 1. He was admitted 04/29/2022 after a fall at home.  He was confused.  CTs revealed acute displaced fractures of the right inferior and lateral orbital wall, anterior/posterior right maxillary sinus walls, and an acute fracture of the right zygomatic arch.  He has no complaint this morning.  He denies nausea/vomiting and neuropathy symptoms following chemotherapy. Blood culture from 04/30/2022 returned positive for Klebsiella pneumoniae Objective: Vital signs in last 24 hours: Blood pressure 124/79, pulse 97, temperature 98.2 F (36.8 C), temperature source Oral, resp. rate (!) 23, height 5' 7" (1.702 m), weight 142 lb 10.2 oz (64.7 kg), SpO2 98 %.  Intake/Output from previous day: 11/06 0701 - 11/07 0700 In: 100 [IV Piggyback:100] Out: 700 [Urine:700]  Physical Exam:  HEENT: Ecchymosis from the right orbit and at the right buccal mucosa Lungs: Clear anteriorly, no respiratory distress Cardiac: Regular rate and rhythm Abdomen: No hepatosplenomegaly, nontender Neurologic: Alert, follows simple commands  Portacath/PICC-without erythema  Lab Results: Recent Labs    05/01/22 1633 05/02/22 0756  WBC 8.3 7.6  HGB 9.1* 8.3*  HCT 28.9* 25.6*  PLT 105* 117*    BMET Recent Labs    05/01/22 1059 05/02/22 0756  NA 138 137  K 3.6 3.3*  CL 106 108  CO2 21* 22  GLUCOSE 86 136*  BUN 18 19  CREATININE 0.96 0.90  CALCIUM 8.1* 8.0*    Lab Results  Component Value Date   CEA1 287.0 (H) 02/11/2022   CEA 644.67 (H) 03/03/2022   CAN199 88 (H) 02/11/2022    Studies/Results: ECHOCARDIOGRAM COMPLETE  Result Date: 05/01/2022    ECHOCARDIOGRAM REPORT   Patient Name:   [name] [name] Date of Exam: 05/01/2022  Medical Rec #:  [id number]     Height:       67.0 in Accession #:    [id number]    Weight:       148.1 lb Date of Birth:  1961-05-04    BSA:          1.780 m Patient Age:    61 years      BP:           102/59 mmHg Patient Gender: M             HR:           34 bpm. Exam Location:  Inpatient Procedure: 2D Echo, Cardiac Doppler, Color Doppler and Intracardiac            Opacification Agent Indications:    Chemo  History:        Patient has prior history of Echocardiogram examinations.                 Previous Myocardial Infarction and CAD, Prior CABG; Risk                 Factors:Dyslipidemia and Former Smoker. PVD. Esophagael cancer.                 Elevated troponin.  Sonographer:    Clayton Lefort RDCS (AE) Referring Phys: [id number] [facility] NARENDRA IMPRESSIONS  1. Left ventricular ejection fraction, by estimation, is 50 to 55%. The left ventricle has low normal function. The left ventricle has no  regional wall motion abnormalities. There is mild left ventricular hypertrophy. Left ventricular diastolic parameters are indeterminate.  2. Right ventricular systolic function is moderately reduced. The right ventricular size is moderately enlarged. There is mildly elevated pulmonary artery systolic pressure. The estimated right ventricular systolic pressure is 67.6 mmHg.  3. Right atrial size was mildly dilated.  4. The mitral valve is normal in structure. No evidence of mitral valve regurgitation.  5. Tricuspid valve regurgitation is moderate.  6. The aortic valve was not well visualized. Aortic valve regurgitation is not visualized. No aortic stenosis is present.  7. The inferior vena cava is dilated in size with >50% respiratory variability, suggesting right atrial pressure of 8 mmHg. FINDINGS  Left Ventricle: Left ventricular ejection fraction, by estimation, is 50 to 55%. The left ventricle has low normal function. The left ventricle has no regional wall motion abnormalities. Definity contrast agent was given IV to  delineate the left ventricular endocardial borders. The left ventricular internal cavity size was normal in size. There is mild left ventricular hypertrophy. Left ventricular diastolic parameters are indeterminate. Right Ventricle: The right ventricular size is moderately enlarged. Right vetricular wall thickness was not well visualized. Right ventricular systolic function is moderately reduced. There is mildly elevated pulmonary artery systolic pressure. The tricuspid regurgitant velocity is 2.74 m/s, and with an assumed right atrial pressure of 8 mmHg, the estimated right ventricular systolic pressure is 19.5 mmHg. Left Atrium: Left atrial size was normal in size. Right Atrium: Right atrial size was mildly dilated. Pericardium: There is no evidence of pericardial effusion. Mitral Valve: The mitral valve is normal in structure. No evidence of mitral valve regurgitation. Tricuspid Valve: The tricuspid valve is normal in structure. Tricuspid valve regurgitation is moderate. Aortic Valve: The aortic valve was not well visualized. Aortic valve regurgitation is not visualized. No aortic stenosis is present. Aortic valve mean gradient measures 5.7 mmHg. Aortic valve peak gradient measures 10.8 mmHg. Aortic valve area, by VTI measures 2.69 cm. Pulmonic Valve: The pulmonic valve was not well visualized. Pulmonic valve regurgitation is not visualized. Aorta: The aortic root and ascending aorta are structurally normal, with no evidence of dilitation. Venous: The inferior vena cava is dilated in size with greater than 50% respiratory variability, suggesting right atrial pressure of 8 mmHg. IAS/Shunts: The interatrial septum was not well visualized.  LEFT VENTRICLE PLAX 2D LVIDd:         4.20 cm LVIDs:         3.20 cm LV PW:         1.50 cm LV IVS:        1.40 cm LVOT diam:     2.20 cm LV SV:         78 LV SV Index:   44 LVOT Area:     3.80 cm  RIGHT VENTRICLE             IVC RV Basal diam:  3.00 cm     IVC diam: 2.20 cm RV  S prime:     14.60 cm/s TAPSE (M-mode): 2.0 cm LEFT ATRIUM             Index        RIGHT ATRIUM           Index LA diam:        3.20 cm 1.80 cm/m   RA Area:     21.90 cm LA Vol (A2C):   63.1 ml 35.45 ml/m  RA Volume:   67.60  ml  37.98 ml/m LA Vol (A4C):   41.3 ml 23.20 ml/m LA Biplane Vol: 53.7 ml 30.17 ml/m  AORTIC VALVE AV Area (Vmax):    2.64 cm AV Area (Vmean):   2.79 cm AV Area (VTI):     2.69 cm AV Vmax:           164.00 cm/s AV Vmean:          112.000 cm/s AV VTI:            0.291 m AV Peak Grad:      10.8 mmHg AV Mean Grad:      5.7 mmHg LVOT Vmax:         114.00 cm/s LVOT Vmean:        82.300 cm/s LVOT VTI:          0.206 m LVOT/AV VTI ratio: 0.71  AORTA Ao Root diam: 3.80 cm Ao Asc diam:  3.40 cm TRICUSPID VALVE TR Peak grad:   30.0 mmHg TR Vmax:        274.00 cm/s  SHUNTS Systemic VTI:  0.21 m Systemic Diam: 2.20 cm Oswaldo Milian MD Electronically signed by Oswaldo Milian MD Signature Date/Time: 05/01/2022/5:51:16 PM    Final    DG CHEST PORT 1 VIEW  Result Date: 05/01/2022 CLINICAL DATA:  Tachypnea. EXAM: PORTABLE CHEST 1 VIEW COMPARISON:  04/29/2022 FINDINGS: Stable position of the right jugular Port-A-Cath with the tip in the lower SVC region. Heart size is slightly enlarged but stable. Atherosclerotic calcifications at the aortic arch. Slightly coarse lung markings are suggestive for chronic changes without acute findings. IMPRESSION: Chronic lung changes without acute findings. Electronically Signed   By: Markus Daft M.D.   On: 05/01/2022 12:13   CT HEAD WO CONTRAST (5MM)  Result Date: 05/01/2022 CLINICAL DATA:  Impression assessment for delirium. Acute right orbital and right maxillary sinus fractures noted November 4. EXAM: CT HEAD WITHOUT CONTRAST TECHNIQUE: Contiguous axial images were obtained from the base of the skull through the vertex without intravenous contrast. RADIATION DOSE REDUCTION: This exam was performed according to the departmental dose-optimization  program which includes automated exposure control, adjustment of the mA and/or kV according to patient size and/or use of iterative reconstruction technique. COMPARISON:  04/29/2022 CT. FINDINGS: Brain: Again noted are mild-to-moderate cerebrocerebellar atrophy and atrophic ventriculomegaly and mild small vessel disease. Small chronic bilateral cerebellar lacunar infarcts are noted, additional chronic lacunar infarct right lentiform nucleus, and a parasagittal chronic infarct in the left occipital lobe. No new asymmetry is seen worrisome for acute infarct, hemorrhage or mass. There is no midline shift or mass effect. The basal cisterns are clear. Vascular: There are patchy calcifications in the carotid siphons but no hyperdense central vessel. Skull: No skull fractures or focal lesions. Sinuses/Orbits: Again noted recent orbital and maxillary sinus wall fractures on the right, with mild displacement. No orbital hematoma or extraocular muscle entrapment is seen. The globes are symmetric and intact. Right periorbital scalp hematoma is again noted, smaller than previously with resolution of prior soft tissue gas. Hemorrhagic opacification of the right maxillary sinus is again noted. The other sinuses are clear. Left mastoids are clear with patchy fluid again noted in the inferior right mastoids. There is left-sided deviation and spurring of the nasal septum. Other: None. IMPRESSION: 1. No acute intracranial CT findings or interval changes. 2. Chronic changes.  Old left PCA infarct.  Old cerebellar lacunae. 3. Redemonstrated right orbital and maxillary sinus wall fractures with hemorrhagic opacification of the right  maxillary sinus. 4. Decreased size of right periorbital scalp hematoma. 5. Small right mastoid effusion. Electronically Signed   By: Telford Nab M.D.   On: 05/01/2022 00:44    Medications: I have reviewed the patient's current medications.  Assessment/Plan: 1.Metastatic adenocarcinoma, likely  esophagus primary - Liver mass, mass or lymph node conglomerate near the GE junction, gastrohepatic ligament and retroperitoneal lymphadenopathy. -02/11/2022 CTA chest-7.8 x 8.5 cm subtle hypoattenuating mass lesion in segment IV of the liver. -02/11/2022 CT abdomen/pelvis-8.7 x 7.8 or ill-defined irregular mass in the medial segment left liver, 4.5 x 3.4 cm necrotic mass lesion just cranial to the esophagogastric junction with metastatic lymphadenopathy in the abdomen and pelvis, irregular nodular peritoneal thickening in the lower abdomen and pelvis -02/11/2022 MRI abdomen-rim-enhancing likely necrotic mass or lymph node conglomerate centered around the gastroesophageal junction measuring 5.0 x 3.9 cm, large rim-enhancing likely necrotic liver mass centered in the anterior left lobe of the liver measuring 7.8 x 7.2 cm, enlarged gastrohepatic ligament and retroperitoneal lymph nodes -Labs from 02/11/2022-AFP 2.4, CA 19.9 88, CEA 287 -Ultrasound-guided liver biopsy performed 02/15/2022-adenocarcinoma with extensive necrosis, CK7 positive, negative for CK20, CDX2, TTF-1, and PAX8, consistent with a pancreatic, lung, or upper gastrointestinal primary -PET 03/08/2022-hypermetabolic distal esophagus mass, hypermetabolic centrally necrotic hepatic mass, hypermetabolic periportal nodes, exophytic GE junction mass without hypermetabolism-likely benign lesion, no hypermetabolism in the pancreas head, bilateral hypermetabolic adrenal gland lesions, small hypermetabolic left parotid gland lesion.  No loss of mismatch repair protein expression, HER2 3+ -CTs 03/11/2022-no change from PET 03/08/2022, known distal esophagus mass-not visualized on unenhanced CT, large hepatic metastasis and upper abdominal nodal metastases, chronic pancreatitis with 4.3 cm pseudocyst in the pancreas head -Cycle 1 FOLFOX, trastuzumab, Pembrolizumab 04/06/2022 -Cycle 2 oxaliplatin, 5-FU bolus/pump and trastuzumab held due to hospitalization  following cycle 1 with possible coronary vasospasm 2.  Myocardial infarction/cardiac arrest 01/31/2022 3.  Microcytic anemia -Labs from 02/11/2022-ferritin 151, iron 13, percent saturation 5% 4.  DVT/pulmonary embolism-bilateral lower extremity DVTs 01/10/2022, pulmonary emboli on chest CT 01/31/2022-apixaban 5.  PAD status post AKA in 2021 6.  RA 7.  Bilateral pleural effusions -Ultrasound-guided thoracentesis performed on 02/11/2022.  Cytology negative for malignancy. 8.  COPD 9.  Admission 03/11/2022 with E. coli bacteremia Ultrasound-guided biopsy/aspiration of dominant necrotic appearing liver lesion 03/17/2022-scant fluid aspirated-negative culture, no organisms or white cells.  Pathology with necrotic tissue. 10.  Admission 04/29/2022 after a fall, multiple skull fractures 11.  Fever, hypotension-Klebsiella bacteremia on blood culture 04/30/2022    Mr Naji has metastatic adenocarcinoma, likely from esophageal primary.  He has completed 2 cycles of systemic therapy and is due for cycle 3 today.  He is admitted after a fall and has suffered multiple skull fractures.  He had a fever/hypotension and a blood culture is positive for Klebsiella.  No obvious source for infection, but the liver/necrotic upper abdominal masses are the most likely source.  The Port-A-Cath does not have clinical evidence of infection.  He is on antibiotics and has been evaluated by the infectious disease team.  Mr. Barna had an NSTEMI following cycle 1 chemotherapy.  I will order an echocardiogram to be sure the LVEF has not declined prior to additional trastuzumab.  We will consider resuming systemic therapy versus hospice care depending on his recovery during the current hospital admission.  I will check on Mr. Popoff over the next few days and outpatient follow-up will be scheduled at the Cancer center.     LOS: 2 days   Betsy Coder,  MD   05/02/2022, 1:51 PM

## 2022-05-02 NOTE — TOC Initial Note (Signed)
Transition of Care [facility] [place]) - Initial/Assessment Note    Patient Details  Name: STRUMMER CANIPE MRN: [id number] Date of Birth: 07-May-1961  Transition of Care [facility]) CM/SW Contact:    Bethena Roys, RN Phone Number: 05/02/2022, 4:02 PM  Clinical Narrative: Risk for readmission assessment completed. PTA patient states he was from home with friends. Patient states he has DME wheelchair, rolling walker, bedside commode, and prosthesis is at the bedside. Patient states his friends take him to appointments and gets his medications. Case Manager asked for PT/OT consult. Case Manager will continue to follow for transition of care needs as the patient progresses.                 Expected Discharge Plan: [place] Barriers to Discharge: Continued Medical Work up   Patient Goals and CMS Choice Patient states their goals for this hospitalization and ongoing recovery are:: patient wants to return home.      Expected Discharge Plan and Services Expected Discharge Plan: [place] In-house Referral: NA Discharge Planning Services: CM Consult Post Acute Care Choice: Ages arrangements for the past 2 months: Single Family Home                   DME Agency: NA   Prior Living Arrangements/Services Living arrangements for the past 2 months: Single Family Home Lives with:: Friends Patient language and need for interpreter reviewed:: Yes Do you feel safe going back to the place where you live?: Yes      Need for Family Participation in Patient Care: Yes (Comment) Care giver support system in place?: Yes (comment) Current home services: DME (wheelchair and prosthesis at bedside.) Criminal Activity/Legal Involvement Pertinent to Current Situation/Hospitalization: No - Comment as needed  Permission Sought/Granted Permission sought to share information with : Family Supports, Case Manager   Emotional Assessment Appearance:: Appears stated  age Attitude/Demeanor/Rapport: Engaged Affect (typically observed): Appropriate Orientation: : Oriented to Situation, Oriented to  Time, Oriented to Place, Oriented to Self Alcohol / Substance Use: Not Applicable Psych Involvement: No (comment)  Admission diagnosis:  Ventricular bigeminy [I49.8] Closed fracture of right orbit, initial encounter ([place]) [S02.85XA] Fall, initial encounter [W19.XXXA] Closed fracture of right side of maxilla, initial encounter ([place]) [S02.40CA] Patient Active Problem List   Diagnosis Date Noted   Bacteremia due to Klebsiella pneumoniae 05/02/2022   Ventricular bigeminy 04/30/2022   Closed fracture of right orbit ([place]) 04/30/2022   Multiple closed facial bone fractures, initial encounter ([place]) 04/30/2022   Fall at home, initial encounter 04/30/2022   CAD s/p CABG 2020, STEMI 01/2022 04/30/2022   Abnormal LFTs 04/30/2022   Elevated troponin 04/11/2022   History of pulmonary embolism    Demand ischemia of myocardium    History of cardiac arrest    Chest pain 04/09/2022   NSTEMI (non-ST elevated myocardial infarction) ([place]) 04/09/2022   Esophageal cancer, stage IV ([place]) 82/99/3716   Acute metabolic encephalopathy 96/78/9381   SIRS, possible sepsis (systemic inflammatory response syndrome) ([place]) 03/11/2022   Acute urinary retention 03/11/2022   Stroke ([place]) 02/28/2022   Chest wall pain    Adenocarcinoma of unknown origin ([place]) 02/22/2022   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), left ([place]) 01/31/2022   Cardiac arrest ([place]) 01/31/2022   Anoxic brain injury ([place]) 01/31/2022   Thrombocytopenia ([place]) 01/31/2022   Pulmonary emboli ([place]) 01/31/2022   Aspiration pneumonia ([place]) 01/31/2022   Arm pain 07/25/2021   PAD (peripheral artery disease) (  [place])    Hypokalemia 05/30/2018   S/P lumbar fusion, Lower back pain 01/03/2018   Coronary artery disease    Preventative health care 03/13/2017   Tobacco use disorder 08/20/2012   Normocytic anemia 08/06/2012    HTN (hypertension) 04/20/2011   Chronic pain syndrome 02/22/2010   HLD (hyperlipidemia) 10/08/2009   Rheumatoid arthritis ([place]) 08/11/2009   PCP:  Angelique Blonder, DO Pharmacy:   [facility] [place], [address] AT [address] [address] Phone: [phone] Fax: [phone]  [place], [place] - [address] [place] [address] Phone: [phone] Fax: [phone]  Readmission Risk Interventions    05/02/2022    4:00 PM 03/15/2022    1:24 PM 05/13/2020    2:32 PM  Readmission Risk Prevention Plan  Transportation Screening Complete Complete Complete  PCP or Specialist Appt within 5-7 Days   Complete  Home Care Screening   Complete  Medication Review (RN CM)   Complete  Medication Review (RN Care Manager) Complete Complete   HRI or Home Care Consult Complete Complete   SW Recovery Care/Counseling Consult Complete Complete   Palliative Care Screening Not Applicable Not [place] Complete Not Applicable

## 2022-05-02 NOTE — Progress Notes (Signed)
HD#2 Subjective:   Summary: [name] [name] is a 61 yo M with a PMH of CAD s/p MI c/b Vfib arrest 01/31/2022, LHC 100% occluded prox RCA w/o intervention, NSTEMI (type 2) (04/09/2022), PE/DVT on chronic AC (01/2022, 12/2021) c/b chronic RV dysfxn, PAD s/p L AKA (04/2020), metastatic adenocarcinoma likely esophageal primary, CVA (02/2022), COPD, HTN, and HLD who presented with chest pain after falling.   Overnight Events: None   Pt is vastly improved this morning. He slept well and denies any vomiting or dark/bloody stools. He has not abdominal pain or worsening face/head pain. Denies issues with moving his eyes. Friend in the room agrees he is looking better but not quite back at baseline.  Objective:  Vital signs in last 24 hours: Vitals:   05/02/22 0255 05/02/22 0400 05/02/22 0500 05/02/22 0806  BP:  102/66 110/75   Pulse:    99  Resp:  20 20 (!) 23  Temp: (!) 100.8 F (38.2 C) 99.3 F (37.4 C)  98 F (36.7 C)  TempSrc: Oral Oral  Oral  SpO2:  98%  97%  Weight:   64.7 kg   Height:       Supplemental O2: Room Air SpO2: 97 %   Physical Exam:  Constitutional: Ill-appearing elderly gentleman sitting in bed, in no acute distress HENT: normocephalic, ecchymosis around the right eye Eyes: conjunctiva non-erythematous, EOM intact without pain Cardiovascular: regular rate and rhythm Pulmonary/Chest: normal work of breathing on room air, lungs clear to auscultation bilaterally Abdominal: soft, non-tender, non-distended, positive bowel sounds MSK: normal bulk and tone, stable left AKA with no lower extremity edema Neurological: alert & oriented x 3 Skin: warm and dry  Filed Weights   04/30/22 1856 05/01/22 0357 05/02/22 0500  Weight: 69.4 kg 67.2 kg 64.7 kg     Intake/Output Summary (Last 24 hours) at 05/02/2022 1047 Last data filed at 05/02/2022 1000 Gross per 24 hour  Intake 663.73 ml  Output 700 ml  Net -36.27 ml   Net IO Since Admission: 1,589.26 mL [05/02/22  1047]  Pertinent Labs:    Latest Ref Rng & Units 05/02/2022    7:56 AM 05/01/2022    4:33 PM 05/01/2022   10:59 AM  CBC  WBC 4.0 - 10.5 K/uL 7.6  8.3  9.1   Hemoglobin 13.0 - 17.0 g/dL 8.3  9.1  9.8   Hematocrit 39.0 - 52.0 % 25.6  28.9  31.5   Platelets 150 - 400 K/uL 117  105  110        Latest Ref Rng & Units 05/02/2022    7:56 AM 05/01/2022   10:59 AM 05/01/2022    2:23 AM  CMP  Glucose 70 - 99 mg/dL 136  86  86   BUN 8 - 23 mg/dL _0 Creatinine 0.61 - 1.24 mg/dL 0.90  0.96  1.16   Sodium 135 - 145 mmol/L 137  138  138   Potassium 3.5 - 5.1 mmol/L 3.3  3.6  4.3   Chloride 98 - 111 mmol/L 108  106  105   CO2 22 - 32 mmol/L _1 Calcium 8.9 - 10.3 mg/dL 8.0  8.1  8.4   Total Protein 6.5 - 8.1 g/dL 5.7  6.2    Total Bilirubin 0.3 - 1.2 mg/dL 0.5  0.8    Alkaline Phos 38 - 126 U/L 241  285    AST 15 - 41 U/L 32  45    ALT 0 - 44 U/L 31  43      Imaging: ECHOCARDIOGRAM COMPLETE  Result Date: 05/01/2022    ECHOCARDIOGRAM REPORT   Patient Name:   [name] [name] Date of Exam: 05/01/2022 Medical Rec #:  [id number]     Height:       67.0 in Accession #:    [id number]    Weight:       148.1 lb Date of Birth:  Dec 22, 1960    BSA:          1.780 m Patient Age:    55 years      BP:           102/59 mmHg Patient Gender: M             HR:           34 bpm. Exam Location:  Inpatient Procedure: 2D Echo, Cardiac Doppler, Color Doppler and Intracardiac            Opacification Agent Indications:    Chemo  History:        Patient has prior history of Echocardiogram examinations.                 Previous Myocardial Infarction and CAD, Prior CABG; Risk                 Factors:Dyslipidemia and Former Smoker. PVD. Esophagael cancer.                 Elevated troponin.  Sonographer:    Clayton Lefort RDCS (AE) Referring Phys: [id number] [facility] NARENDRA IMPRESSIONS  1. Left ventricular ejection fraction, by estimation, is 50 to 55%. The left ventricle has low normal function. The left ventricle has  no regional wall motion abnormalities. There is mild left ventricular hypertrophy. Left ventricular diastolic parameters are indeterminate.  2. Right ventricular systolic function is moderately reduced. The right ventricular size is moderately enlarged. There is mildly elevated pulmonary artery systolic pressure. The estimated right ventricular systolic pressure is 30.8 mmHg.  3. Right atrial size was mildly dilated.  4. The mitral valve is normal in structure. No evidence of mitral valve regurgitation.  5. Tricuspid valve regurgitation is moderate.  6. The aortic valve was not well visualized. Aortic valve regurgitation is not visualized. No aortic stenosis is present.  7. The inferior vena cava is dilated in size with >50% respiratory variability, suggesting right atrial pressure of 8 mmHg. FINDINGS  Left Ventricle: Left ventricular ejection fraction, by estimation, is 50 to 55%. The left ventricle has low normal function. The left ventricle has no regional wall motion abnormalities. Definity contrast agent was given IV to delineate the left ventricular endocardial borders. The left ventricular internal cavity size was normal in size. There is mild left ventricular hypertrophy. Left ventricular diastolic parameters are indeterminate. Right Ventricle: The right ventricular size is moderately enlarged. Right vetricular wall thickness was not well visualized. Right ventricular systolic function is moderately reduced. There is mildly elevated pulmonary artery systolic pressure. The tricuspid regurgitant velocity is 2.74 m/s, and with an assumed right atrial pressure of 8 mmHg, the estimated right ventricular systolic pressure is 65.7 mmHg. Left Atrium: Left atrial size was normal in size. Right Atrium: Right atrial size was mildly dilated. Pericardium: There is no evidence of pericardial effusion. Mitral Valve: The mitral valve is normal in structure. No evidence of mitral valve regurgitation. Tricuspid Valve: The  tricuspid valve is normal in structure. Tricuspid valve regurgitation is  moderate. Aortic Valve: The aortic valve was not well visualized. Aortic valve regurgitation is not visualized. No aortic stenosis is present. Aortic valve mean gradient measures 5.7 mmHg. Aortic valve peak gradient measures 10.8 mmHg. Aortic valve area, by VTI measures 2.69 cm. Pulmonic Valve: The pulmonic valve was not well visualized. Pulmonic valve regurgitation is not visualized. Aorta: The aortic root and ascending aorta are structurally normal, with no evidence of dilitation. Venous: The inferior vena cava is dilated in size with greater than 50% respiratory variability, suggesting right atrial pressure of 8 mmHg. IAS/Shunts: The interatrial septum was not well visualized.  LEFT VENTRICLE PLAX 2D LVIDd:         4.20 cm LVIDs:         3.20 cm LV PW:         1.50 cm LV IVS:        1.40 cm LVOT diam:     2.20 cm LV SV:         78 LV SV Index:   44 LVOT Area:     3.80 cm  RIGHT VENTRICLE             IVC RV Basal diam:  3.00 cm     IVC diam: 2.20 cm RV S prime:     14.60 cm/s TAPSE (M-mode): 2.0 cm LEFT ATRIUM             Index        RIGHT ATRIUM           Index LA diam:        3.20 cm 1.80 cm/m   RA Area:     21.90 cm LA Vol (A2C):   63.1 ml 35.45 ml/m  RA Volume:   67.60 ml  37.98 ml/m LA Vol (A4C):   41.3 ml 23.20 ml/m LA Biplane Vol: 53.7 ml 30.17 ml/m  AORTIC VALVE AV Area (Vmax):    2.64 cm AV Area (Vmean):   2.79 cm AV Area (VTI):     2.69 cm AV Vmax:           164.00 cm/s AV Vmean:          112.000 cm/s AV VTI:            0.291 m AV Peak Grad:      10.8 mmHg AV Mean Grad:      5.7 mmHg LVOT Vmax:         114.00 cm/s LVOT Vmean:        82.300 cm/s LVOT VTI:          0.206 m LVOT/AV VTI ratio: 0.71  AORTA Ao Root diam: 3.80 cm Ao Asc diam:  3.40 cm TRICUSPID VALVE TR Peak grad:   30.0 mmHg TR Vmax:        274.00 cm/s  SHUNTS Systemic VTI:  0.21 m Systemic Diam: 2.20 cm Oswaldo Milian MD Electronically signed by  Oswaldo Milian MD Signature Date/Time: 05/01/2022/5:51:16 PM    Final    DG CHEST PORT 1 VIEW  Result Date: 05/01/2022 CLINICAL DATA:  Tachypnea. EXAM: PORTABLE CHEST 1 VIEW COMPARISON:  04/29/2022 FINDINGS: Stable position of the right jugular Port-A-Cath with the tip in the lower SVC region. Heart size is slightly enlarged but stable. Atherosclerotic calcifications at the aortic arch. Slightly coarse lung markings are suggestive for chronic changes without acute findings. IMPRESSION: Chronic lung changes without acute findings. Electronically Signed   By: Markus Daft M.D.   On: 05/01/2022 12:13  Assessment/Plan:   Principal Problem:   Fall at home, initial encounter Active Problems:   Chronic pain syndrome   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), left (HCC)   SIRS, possible sepsis (systemic inflammatory response syndrome) (HCC)   Cancer of esophagus (HCC)   History of pulmonary embolism   Elevated troponin   Ventricular bigeminy   Orbital wall fracture, closed, initial encounter ([place])   Multiple closed facial bone fractures, initial encounter ([place])   CAD s/p CABG 2020, STEMI 01/2022   Abnormal LFTs   Patient Summary: [name] [name] is a 61 y.o. with a pertinent PMH of CAD s/p MI c/b Vfib arrest 01/31/2022, LHC 100% occluded prox RCA w/o intervention, NSTEMI (type 2) (04/09/2022), PE/DVT on chronic AC (01/2022, 12/2021) c/b chronic RV dysfxn, PAD s/p L AKA (04/2020), metastatic adenocarcinoma likely esophageal primary, CVA (02/2022), COPD, HTN, and HLD who presented with chest pain after falling.    Klebsiella pneumoniae bacteremia Encephalopathy Encephalopathy is improving and patient is near baseline, alert and oriented x4.  Had a fever of 100.8 overnight but leukocytosis has resolved.  We will narrow antibiotic to cefazolin.  Also ordering CT abdomen and pelvis with contrast to evaluate for any evidence of abscesses or other nidus of his bacteremia.  Currently we suspect his  liver metastasis is likely the nidus and will plan for 2 weeks of antibiotics (stop date 05/14/2022).  If there is evidence of an abscess we will reevaluate but will likely need full 3 weeks of antibiotics (stop date 05/21/2022). -Transition from ceftriaxone to cefazolin (started 11/5) -CT abdomen pelvis with contrast, will reconsult IR if there appears to be a drainable mass  Fall with facial fractures Syncope Overall improved without any worsening of pain or symptoms.  Drop in hemoglobin to 8.3 from 13.9 on admission.  This in part is due to the facial fractures with maxillary and scalp hematomas. - We will continue to monitor neurologic status  Anemia/thrombocytopenia Coffee-ground emesis Hemoglobin on admission of 13.9 dropped to 8.3 today.  Questionable GI bleed with coffee-ground emesis and history of esophageal adenocarcinoma.  Other explanation is maxillary hematoma is draining into the nasopharynx.  No more emesis or signs of GI bleeding.  Iron studies show low iron and saturation.  We will hold off on GI consult at this time but will plan to involve them if there is any more signs of GI bleeding. - Hold on iron therapy but will start this after bacteremia is resolved  Adenocarcinoma of Esophageal w/ liver metastasis  Followed by Dr. Benay Spice.  Liver biopsy in 09/1935 while complicated with E.coli bacteremia showed adenocarcinoma.  After discussing with IR we will get a contrasted CT of his abdomen pelvis to look for any further source of infection that may be drainable but we will hold off on tissue culture for now.  Transaminitis Elevated alk phos Transaminitis on admission has resolved but ALK Foss remains elevated at 241.  He does have known liver cysts but we will also obtain a CT scan of the abdomen for further evaluation of his bacteremia.  We will continue to monitor.  CAD/DVT/PE/PAD s/p left AKA Continue statin.  Hold Eliquis and Plavix in the setting of bleeding  Diet:  Full  liquid diet, will advance later today if stable IVF: None VTE: None Code: Full PT/OT recs: Pending,.   Dispo: Anticipated discharge pending further work-up and treatment of bacteremia as well as PT/OT evaluation.  [name] DO Internal Medicine Resident PGY-1 Pager:  [phone]  Please contact the on call pager after 5 pm and on weekends at [phone].

## 2022-05-03 ENCOUNTER — Inpatient Hospital Stay: Payer: Medicare Other | Admitting: Nutrition

## 2022-05-03 DIAGNOSIS — Y92009 Unspecified place in unspecified non-institutional (private) residence as the place of occurrence of the external cause: Secondary | ICD-10-CM | POA: Diagnosis not present

## 2022-05-03 DIAGNOSIS — I498 Other specified cardiac arrhythmias: Secondary | ICD-10-CM | POA: Diagnosis not present

## 2022-05-03 DIAGNOSIS — W19XXXA Unspecified fall, initial encounter: Secondary | ICD-10-CM | POA: Diagnosis not present

## 2022-05-03 LAB — CBC
HCT: 27 % — ABNORMAL LOW (ref 39.0–52.0)
Hemoglobin: 8.3 g/dL — ABNORMAL LOW (ref 13.0–17.0)
MCH: 25.2 pg — ABNORMAL LOW (ref 26.0–34.0)
MCHC: 30.7 g/dL (ref 30.0–36.0)
MCV: 82.1 fL (ref 80.0–100.0)
Platelets: 138 10*3/uL — ABNORMAL LOW (ref 150–400)
RBC: 3.29 MIL/uL — ABNORMAL LOW (ref 4.22–5.81)
RDW: 19.9 % — ABNORMAL HIGH (ref 11.5–15.5)
WBC: 7.9 10*3/uL (ref 4.0–10.5)
nRBC: 0 % (ref 0.0–0.2)

## 2022-05-03 LAB — COMPREHENSIVE METABOLIC PANEL
ALT: 25 U/L (ref 0–44)
AST: 25 U/L (ref 15–41)
Albumin: 2 g/dL — ABNORMAL LOW (ref 3.5–5.0)
Alkaline Phosphatase: 228 U/L — ABNORMAL HIGH (ref 38–126)
Anion gap: 7 (ref 5–15)
BUN: 13 mg/dL (ref 8–23)
CO2: 22 mmol/L (ref 22–32)
Calcium: 8 mg/dL — ABNORMAL LOW (ref 8.9–10.3)
Chloride: 105 mmol/L (ref 98–111)
Creatinine, Ser: 0.86 mg/dL (ref 0.61–1.24)
GFR, Estimated: >60 mL/min (ref >60)
Glucose, Bld: 81 mg/dL (ref 70–99)
Potassium: 4 mmol/L (ref 3.5–5.1)
Sodium: 134 mmol/L — ABNORMAL LOW (ref 135–145)
Total Bilirubin: 0.5 mg/dL (ref 0.3–1.2)
Total Protein: 5.7 g/dL — ABNORMAL LOW (ref 6.5–8.1)

## 2022-05-03 LAB — MAGNESIUM: Magnesium: 1.9 mg/dL (ref 1.7–2.4)

## 2022-05-03 LAB — GLUCOSE, CAPILLARY: Glucose-Capillary: 77 mg/dL (ref 70–99)

## 2022-05-03 MED ORDER — PANTOPRAZOLE SODIUM 40 MG PO TBEC
40.0000 mg | DELAYED_RELEASE_TABLET | Freq: Every day | ORAL | Status: DC
  Administered 2022-05-03 – 2022-05-04 (×2): 40 mg via ORAL
  Filled 2022-05-03 (×2): qty 1

## 2022-05-03 MED ORDER — OXYCODONE-ACETAMINOPHEN 10-325 MG PO TABS: 1.0000 | ORAL_TABLET | Freq: Four times a day (QID) | ORAL | Status: DC | PRN

## 2022-05-03 MED ORDER — OXYCODONE HCL 5 MG PO TABS
5.0000 mg | ORAL_TABLET | Freq: Four times a day (QID) | ORAL | Status: DC | PRN
  Administered 2022-05-03 – 2022-05-04 (×2): 5 mg via ORAL
  Filled 2022-05-03 (×2): qty 1

## 2022-05-03 MED ORDER — OXYCODONE-ACETAMINOPHEN 5-325 MG PO TABS
1.0000 | ORAL_TABLET | Freq: Four times a day (QID) | ORAL | Status: DC | PRN
  Administered 2022-05-03: 1 via ORAL
  Filled 2022-05-03: qty 1

## 2022-05-03 NOTE — Evaluation (Signed)
Occupational Therapy Evaluation Patient Details Name: [name] [name] MRN: [id number] DOB: 10-Nov-1960 Today's Date: 05/03/2022   History of Present Illness Pt is a 61 y/o M presenting to ED on 11/4 after fall from w/c sustaining facial injuries. Workup revealing lateral and inferior fractures of orbital rim. PMHi ncludes metastatic adenocarcinoma currently on chemotherapy, multiple DVT/PE on anticoagulation, HTN, HLD, CAD s/p CABG 2020, inferior STEMI complicated by cardiac arrest 01/2022, CVA, and L AKA.   Clinical Impression   Pt uses w/c at baseline mostly for mobility, although reports transferring at times independently. Pt needs some assist with ADLs, IADLs, and transportation. Pt currently living with friends who can provide 24/7 assist at d/c. Pt needing min-mod A for ADLs, mod A for bed mobility and min-mod A for lateral scoot and squat pivot transfer to chair. Pt with bruising to R eye, vision assessment performed, pt with decreased attention to R side peripheral vision when given stimuli bilaterally, pt able to read short sentences on food menu with increased time. Pt presenting with impairments listed below, will follow acutely. Recommend HHOT at d/c.      Recommendations for follow up therapy are one component of a multi-disciplinary discharge planning process, led by the attending physician.  Recommendations may be updated based on patient status, additional functional criteria and insurance authorization.   Follow Up Recommendations  Home health OT    Assistance Recommended at Discharge Intermittent Supervision/Assistance  Patient can return home with the following A little help with bathing/dressing/bathroom    Functional Status Assessment  Patient has not had a recent decline in their functional status  Equipment Recommendations  None recommended by OT (pt has all needed DME)    Recommendations for Other Services PT consult     Precautions / Restrictions  Precautions Precautions: Fall Precaution Comments: L AKA Restrictions Weight Bearing Restrictions: No      Mobility Bed Mobility Overal bed mobility: Needs Assistance Bed Mobility: Sidelying to Sit   Sidelying to sit: Mod assist            Transfers Overall transfer level: Needs assistance   Transfers: Bed to chair/wheelchair/BSC     Squat pivot transfers: Mod assist      Lateral/Scoot Transfers: Min assist, Mod assist General transfer comment: partial lateral scoot, then squat pivot transfer to chair on R side      Balance Overall balance assessment: Needs assistance Sitting-balance support: No upper extremity supported, Feet supported Sitting balance-Leahy Scale: Good                                     ADL either performed or assessed with clinical judgement   ADL Overall ADL's : Needs assistance/impaired Eating/Feeding: Set up;Sitting   Grooming: Set up;Sitting   Upper Body Bathing: Minimal assistance;Sitting   Lower Body Bathing: Moderate assistance;Sitting/lateral leans   Upper Body Dressing : Minimal assistance;Sitting   Lower Body Dressing: Moderate assistance;Sitting/lateral leans Lower Body Dressing Details (indicate cue type and reason): donning of sock and R shoe                     Vision   Vision Assessment?: Yes Eye Alignment: Within Functional Limits Ocular Range of Motion: Within Functional Limits Alignment/Gaze Preference: Within Defined Limits Additional Comments: pt unable to attend to stimuli on R side when given bilateral stimuli on both sides, able to read short words/sentences on menu on all  areas of page     Perception     Praxis      Pertinent Vitals/Pain Pain Assessment Pain Assessment: Faces Pain Score: 8  Faces Pain Scale: Hurts whole lot Pain Location: low back     Hand Dominance Left   Extremity/Trunk Assessment Upper Extremity Assessment Upper Extremity Assessment: Generalized  weakness   Lower Extremity Assessment Lower Extremity Assessment: Defer to PT evaluation   Cervical / Trunk Assessment Cervical / Trunk Assessment: Normal   Communication Communication Communication: No difficulties   Cognition Arousal/Alertness: Awake/alert Behavior During Therapy: WFL for tasks assessed/performed Overall Cognitive Status: Within Functional Limits for tasks assessed                                 General Comments: some slow processing, needing increased cues for safety and sequencing of task     General Comments  VSS on RA    Exercises     Shoulder Instructions      Home Living Family/patient expects to be discharged to:: Private residence Living Arrangements: Non-relatives/Friends Available Help at Discharge: Friend(s);Available 24 hours/day Type of Home: House Home Access: Ramped entrance     Home Layout: One level     Bathroom Shower/Tub: Teacher, early years/pre: Standard Bathroom Accessibility: Yes How Accessible: Accessible via wheelchair Home Equipment: Tub bench;Grab bars - tub/shower;Rolling Walker (2 wheels);Wheelchair - manual;BSC/3in1;Hand held shower head (rails for bed)          Prior Functioning/Environment Prior Level of Function : Needs assist             Mobility Comments: Ambulatory with RW and prosthetic 40-50steps per pt. Working with HHPT, uses w/c for mobility more often ADLs Comments: Friends manage IADLs, intermittent assist for ADLs (tub transfers, LB dressing as needed), friends assist with transportation        OT Problem List: Decreased strength;Decreased range of motion;Impaired balance (sitting and/or standing);Decreased activity tolerance;Impaired vision/perception;Decreased safety awareness      OT Treatment/Interventions: Self-care/ADL training;Therapeutic exercise;Energy conservation;DME and/or AE instruction;Therapeutic activities;Patient/family education;Balance training     OT Goals(Current goals can be found in the care plan section) Acute Rehab OT Goals Patient Stated Goal: none stated OT Goal Formulation: With patient Time For Goal Achievement: 05/17/22 Potential to Achieve Goals: Good ADL Goals Pt Will Perform Upper Body Dressing: with min guard assist;sitting Pt Will Perform Lower Body Dressing: with min guard assist;sitting/lateral leans;sit to/from stand Pt Will Transfer to Toilet: with min guard assist;with transfer board;bedside commode Additional ADL Goal #1: pt will perform bed mobility with supervision in prep for ADLs  OT Frequency: Min 2X/week    Co-evaluation PT/OT/SLP Co-Evaluation/Treatment: Yes Reason for Co-Treatment: Complexity of the patient's impairments (multi-system involvement);To address functional/ADL transfers;For patient/therapist safety   OT goals addressed during session: ADL's and self-care      AM-PAC OT "6 Clicks" Daily Activity     Outcome Measure Help from another person eating meals?: None Help from another person taking care of personal grooming?: A Little Help from another person toileting, which includes using toliet, bedpan, or urinal?: A Lot Help from another person bathing (including washing, rinsing, drying)?: A Lot Help from another person to put on and taking off regular upper body clothing?: A Little Help from another person to put on and taking off regular lower body clothing?: A Lot 6 Click Score: 16   End of Session Equipment Utilized During Treatment: Rolling walker (  2 wheels) Nurse Communication: Mobility status  Activity Tolerance: Patient tolerated treatment well Patient left: in chair;with call bell/phone within reach;with chair alarm set;with family/visitor present  OT Visit Diagnosis: Unsteadiness on feet (R26.81);Other abnormalities of gait and mobility (R26.89);Muscle weakness (generalized) (M62.81)                Time: 6578-4696 OT Time Calculation (min): 33 min Charges:  OT General  Charges $OT Visit: 1 Visit OT Evaluation $OT Eval Moderate Complexity: 1 [address] OTD, OTR/L Acute Rehab [phone] - Combine 05/03/2022, 1:09 PM

## 2022-05-03 NOTE — Progress Notes (Signed)
HD#3 Subjective:   Summary: [name] [name] is a 61 yo M with a PMH of CAD s/p MI c/b Vfib arrest 01/31/2022, LHC 100% occluded prox RCA w/o intervention, NSTEMI (type 2) (04/09/2022), PE/DVT on chronic AC (01/2022, 12/2021) c/b chronic RV dysfxn, PAD s/p L AKA (04/2020), metastatic adenocarcinoma likely esophageal primary, CVA (02/2022), COPD, HTN, and HLD who presented with chest pain after falling.   Overnight Events: None   Pt is doing well this morning. He denies any vomiting, abdominal pain, vision changes, or confusion. He wants to eat solid foods. Friend is in the room and states he is at baseline.   Objective:  Vital signs in last 24 hours: Vitals:   05/02/22 2045 05/03/22 0025 05/03/22 0042 05/03/22 0459  BP:  127/79  120/85  Pulse:  (!) 106  100  Resp: 18 (!) 29 (!) 21 (!) 26  Temp:  98 F (36.7 C)  98.9 F (37.2 C)  TempSrc:    Oral  SpO2:  97%  92%  Weight:    64.9 kg  Height:       Supplemental O2: Room Air SpO2: 92 %   Physical Exam:  Constitutional: Ill-appearing elderly gentleman sitting in bed, in no acute distress HENT: normocephalic, ecchymosis around the right eye Eyes: conjunctiva non-erythematous, EOM intact without pain Cardiovascular: regular rate and rhythm Pulmonary/Chest: normal work of breathing on room air, lungs clear to auscultation bilaterally Abdominal: soft, non-tender, non-distended, positive bowel sounds MSK: normal bulk and tone, stable left AKA with no lower extremity edema Neurological: alert & oriented x 3 Skin: warm and dry  Filed Weights   05/01/22 0357 05/02/22 0500 05/03/22 0459  Weight: 67.2 kg 64.7 kg 64.9 kg     Intake/Output Summary (Last 24 hours) at 05/03/2022 0759 Last data filed at 05/02/2022 1839 Gross per 24 hour  Intake 1163.61 ml  Output 200 ml  Net 963.61 ml   Net IO Since Admission: 1,989.14 mL [05/03/22 0759]  Pertinent Labs:    Latest Ref Rng & Units 05/03/2022    4:09 AM 05/02/2022    6:37 PM 05/02/2022     7:56 AM  CBC  WBC 4.0 - 10.5 K/uL 7.9  7.0  7.6   Hemoglobin 13.0 - 17.0 g/dL 8.3  8.4  8.3   Hematocrit 39.0 - 52.0 % 27.0  27.0  25.6   Platelets 150 - 400 K/uL 138  119  117        Latest Ref Rng & Units 05/03/2022    4:09 AM 05/02/2022    7:56 AM 05/01/2022   10:59 AM  CMP  Glucose 70 - 99 mg/dL 81  136  86   BUN 8 - 23 mg/dL _0 Creatinine 0.61 - 1.24 mg/dL 0.86  0.90  0.96   Sodium 135 - 145 mmol/L 134  137  138   Potassium 3.5 - 5.1 mmol/L 4.0  3.3  3.6   Chloride 98 - 111 mmol/L 105  108  106   CO2 22 - 32 mmol/L _1 Calcium 8.9 - 10.3 mg/dL 8.0  8.0  8.1   Total Protein 6.5 - 8.1 g/dL 5.7  5.7  6.2   Total Bilirubin 0.3 - 1.2 mg/dL 0.5  0.5  0.8   Alkaline Phos 38 - 126 U/L 228  241  285   AST 15 - 41 U/L 25  32  45   ALT 0 -  44 U/L 25  31  43     Imaging: CT ABDOMEN PELVIS W CONTRAST  Result Date: 05/02/2022 CLINICAL DATA:  Sepsis. EXAM: CT ABDOMEN AND PELVIS WITH CONTRAST TECHNIQUE: Multidetector CT imaging of the abdomen and pelvis was performed using the standard protocol following bolus administration of intravenous contrast. RADIATION DOSE REDUCTION: This exam was performed according to the departmental dose-optimization program which includes automated exposure control, adjustment of the mA and/or kV according to patient size and/or use of iterative reconstruction technique. CONTRAST:  25m OMNIPAQUE IOHEXOL 350 MG/ML SOLN COMPARISON:  CT 03/11/2022. PET CT 03/08/2022 FINDINGS: Lower chest: Trace bilateral pleural effusions, left greater than right. The heart is mildly enlarged. Distal esophageal wall thickening. Hepatobiliary: Hypodense central hepatic lesion measures approximately 8.3 cm, series 3, image 14, grossly unchanged from prior exam. No evidence of new hepatic lesion. No partially distended gallbladder without calcified gallstone. There is no biliary dilatation. Pancreas: Fluid collection in the pancreatic head is increased in size from prior  exam, currently appearing bilobed measuring 5.2 x 5.2 cm, series 3, image 32, previously 4.3 x 2.9 cm. There is adjacent peripancreatic fat stranding that is also increased. Punctate calcifications in the pancreatic head are again seen. There is no definite ductal dilatation. Spleen: Enlarged spanning 14.8 cm cranial caudal no focal splenic abnormalities. Adrenals/Urinary Tract: Mild adrenal thickening without discrete adrenal nodule. No hydronephrosis or perinephric edema. Homogeneous renal enhancement with symmetric excretion on delayed phase imaging. No evidence of renal stone or focal abnormality. Urinary bladder is physiologically distended without wall thickening. Stomach/Bowel: Distal esophageal wall thickening that was hypermetabolic on prior PET. Wall thickening of the distal stomach and proximal duodenum is adjacent pancreatic inflammation and may be reactive. There is no small bowel obstruction with administered enteric contrast reaching the colon. No additional sites of small bowel inflammation. Normal appendix. Descending and sigmoid colonic diverticulosis. No diverticulitis. Moderate colonic stool. Vascular/Lymphatic: Prominent aortic and branch atherosclerosis. The portal and splenic veins are patent. There is effacement of the portal splenic confluence due to pancreatic pseudocyst. Mesenteric venous branches are patent. Metastatic adenopathy is again seen. Necrotic gastrohepatic lymph node measures 3.2 cm series 3, image 24, previously 2.7 cm. Necrotic nodal mass in the region of the gastroesophageal junction measures 5.1 x 4.3 cm, previously poorly defined on noncontrast exam, but appears increased from August. There are prominent porta hepatis nodes. Calcified periaortic lymph nodes are stable from prior exams. Small pelvic lymph nodes are stable. Reproductive: Prostate is unremarkable. Other: Mild presacral stranding again seen. Small amount of perihepatic ascites. There is increasing  peripancreatic edema. No free air. Musculoskeletal: Avascular necrosis of both femoral heads. Surgical hardware in the lumbosacral junction. No focal bone lesions. IMPRESSION: 1. Increased peripancreatic edema from prior exam, suggesting acute on chronic pancreatitis. Increased size of pancreatic head pseudocyst from prior exam, currently appearing bilobed. 2. Again seen distal esophageal wall thickening. Again seen metastatic adenopathy in the upper abdomen, with increased size of necrotic nodal masses in the region of the gastroesophageal junction and in the gastrohepatic ligament. 3. Unchanged hepatic metastasis. 4. Splenomegaly. 5. Trace bilateral pleural effusions, left greater than right. 6. Colonic diverticulosis without diverticulitis. Aortic Atherosclerosis (ICD10-I70.0). Electronically Signed   By: MKeith RakeM.D.   On: 05/02/2022 19:21    Assessment/Plan:   Principal Problem:   Fall at home, initial encounter Active Problems:   Chronic pain syndrome   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), left (HCC)   SIRS, possible sepsis (systemic inflammatory  response syndrome) ([place])   Esophageal cancer, stage IV ([place])   History of pulmonary embolism   Elevated troponin   Ventricular bigeminy   Closed fracture of right orbit ([place])   Multiple closed facial bone fractures, initial encounter ([place])   CAD s/p CABG 2020, STEMI 01/2022   Abnormal LFTs   Bacteremia due to Klebsiella pneumoniae   Patient Summary: [name] [name] is a 61 y.o. with a pertinent PMH of CAD s/p MI c/b Vfib arrest 01/31/2022, LHC 100% occluded prox RCA w/o intervention, NSTEMI (type 2) (04/09/2022), PE/DVT on chronic AC (01/2022, 12/2021) c/b chronic RV dysfxn, PAD s/p L AKA (04/2020), metastatic adenocarcinoma likely esophageal primary, CVA (02/2022), COPD, HTN, and HLD who presented with chest pain after falling.      Klebsiella pneumoniae bacteremia Encephalopathy 2/2 bacteremia- resolved Encephalopathy has resolved  and patient is at baseline.  No fevers overnight.  CT abdomen and pelvis showed evidence of chronic otitis with slightly large pseudocyst but no other notable changes from prior.  Most likely etiology of bacteremia is from his liver metastasis, however this cannot be drained so we will continue with antibiotic therapy for 2 weeks total, stop date 05/14/2022. -Continue cefazolin     Fall with facial fractures Syncope Overall improved without any worsening of pain or symptoms.  Drop in hemoglobin to 8.3 from 13.9 on admission.  Hemoglobin remained stable and no new or worsening pain or deficits from injuries. - We will continue to monitor neurologic status   Anemia/thrombocytopenia Coffee-ground emesis Hemoglobin on admission of 13.9 dropped to 8.3 and is stable today. No more emesis or signs of GI bleeding.  Iron studies show low iron and saturation.   - Hold on iron therapy but will start this after bacteremia is resolved   Adenocarcinoma of Esophageal w/ liver metastasis  Followed by Dr. Benay Spice.  Liver biopsy in 08/6627 while complicated with E.coli bacteremia showed adenocarcinoma.  CT abdomen pelvis showed no signs of new metastasis.  Dr. Benay Spice plans to resume therapy we will discuss hospice with the patient based on the outcome of his hospitalization.   Transaminitis Elevated alk phos Transaminitis on admission has resolved but alk phos remains elevated.  He has known liver metastasis and pancreatic pseudocyst.    CAD/DVT/PE/PAD s/p left AKA Continue statin.  Hold Eliquis and Plavix in the setting of bleeding.  Plan to resume these prior to discharge.  Diet: Normal IVF: None VTE: None Code: Full PT/OT recs: Home Health.  Dispo: Anticipated discharge to Home in 1-2 days pending continued improvement/stability.   [facility] Resident PGY-1 Pager: [phone]  Please contact the on call pager after 5 pm and on weekends at [phone].

## 2022-05-03 NOTE — Evaluation (Signed)
Physical Therapy Evaluation Patient Details Name: [name] [name] MRN: [id number] DOB: Jun 18, 1961 Today's Date: 05/03/2022  History of Present Illness  Pt is a 61 y/o M presenting to ED on 11/4 after fall from w/c sustaining facial injuries. Workup revealing lateral and inferior fractures of orbital rim. PMHi ncludes metastatic adenocarcinoma currently on chemotherapy, multiple DVT/PE on anticoagulation, HTN, HLD, CAD s/p CABG 2020, inferior STEMI complicated by cardiac arrest 01/2022, CVA, and L AKA.  Clinical Impression  PTA pt living with friends who assisted him with transfers to and from wheelchair, iADLs, ADLs and transportation. Pt is limited in safe mobility by generalized weakness in presence of L AKA, decreased safety awareness, and historic low back pain. Pt is modA for bed mobility and modAx2 for lateral scoot and pivot transfer to chair. PT recommending HHPT at discharge with goal of going to OP PT to work on prosthetic training. PT will continue to follow acutely.     Recommendations for follow up therapy are one component of a multi-disciplinary discharge planning process, led by the attending physician.  Recommendations may be updated based on patient status, additional functional criteria and insurance authorization.  Follow Up Recommendations Home health PT      Assistance Recommended at Discharge Frequent or constant Supervision/Assistance  Patient can return home with the following  A lot of help with walking and/or transfers;A lot of help with bathing/dressing/bathroom;Assistance with cooking/housework;Direct supervision/assist for medications management;Direct supervision/assist for financial management;Assist for transportation;Help with stairs or ramp for entrance    Equipment Recommendations None recommended by PT     Functional Status Assessment Patient has had a recent decline in their functional status and demonstrates the ability to make significant improvements in  function in a reasonable and predictable amount of time.     Precautions / Restrictions Precautions Precautions: Fall Precaution Comments: L AKA Restrictions Weight Bearing Restrictions: No      Mobility  Bed Mobility Overal bed mobility: Needs Assistance Bed Mobility: Sidelying to Sit   Sidelying to sit: Mod assist       General bed mobility comments: pt able to manage LE off EoB, requires modA for bringing trunk to upright    Transfers Overall transfer level: Needs assistance   Transfers: Bed to chair/wheelchair/BSC       Squat pivot transfers: Mod assist    Lateral/Scoot Transfers: Min assist, Mod assist General transfer comment: partial lateral scoot, then squat pivot transfer to chair on R side          Balance Overall balance assessment: Needs assistance Sitting-balance support: No upper extremity supported, Feet supported Sitting balance-Leahy Scale: Good                                       Pertinent Vitals/Pain Pain Assessment Faces Pain Scale: Hurts whole lot Pain Location: low back    Home Living Family/patient expects to be discharged to:: Private residence Living Arrangements: Non-relatives/Friends Available Help at Discharge: Friend(s);Available 24 hours/day Type of Home: House Home Access: Ramped entrance       Home Layout: One level Home Equipment: Tub bench;Grab bars - tub/shower;Rolling Walker (2 wheels);Wheelchair - manual;BSC/3in1;Hand held shower head (rails for bed)      Prior Function Prior Level of Function : Needs assist             Mobility Comments: since prior hospitalizationpt requiring assist for transfers to and from wheelchair and  car, friend provideds transportation ADLs Comments: Friends manage IADLs, intermittent assist for ADLs (tub transfers, LB dressing as needed), friends assist with transportation     Hand Dominance   Dominant Hand: Left    Extremity/Trunk Assessment   Upper  Extremity Assessment Upper Extremity Assessment: Generalized weakness;Defer to OT evaluation    Lower Extremity Assessment Lower Extremity Assessment: Generalized weakness LLE Deficits / Details: L AKA at baseline    Cervical / Trunk Assessment Cervical / Trunk Assessment: Normal  Communication   Communication: No difficulties  Cognition Arousal/Alertness: Awake/alert Behavior During Therapy: WFL for tasks assessed/performed Overall Cognitive Status: Within Functional Limits for tasks assessed                                 General Comments: some slow processing, needing increased cues for safety and sequencing of task        General Comments General comments (skin integrity, edema, etc.): VSS on RA        Assessment/Plan    PT Assessment Patient needs continued PT services  PT Problem List Decreased strength;Decreased activity tolerance;Decreased balance;Decreased mobility;Decreased coordination;Decreased cognition;Decreased knowledge of use of DME;Decreased safety awareness;Pain       PT Treatment Interventions DME instruction;Functional mobility training;Therapeutic activities;Therapeutic exercise;Balance training;Cognitive remediation;Wheelchair mobility training;Patient/family education    PT Goals (Current goals can be found in the Care Plan section)  Acute Rehab PT Goals Patient Stated Goal: to go home PT Goal Formulation: With patient Time For Goal Achievement: 05/03/22 Potential to Achieve Goals: Good    Frequency Min 3X/week     Co-evaluation PT/OT/SLP Co-Evaluation/Treatment: Yes Reason for Co-Treatment: Complexity of the patient's impairments (multi-system involvement) PT goals addressed during session: Mobility/safety with mobility OT goals addressed during session: ADL's and self-care       AM-PAC PT "6 Clicks" Mobility  Outcome Measure Help needed turning from your back to your side while in a flat bed without using bedrails?: A  Little Help needed moving from lying on your back to sitting on the side of a flat bed without using bedrails?: Total Help needed moving to and from a bed to a chair (including a wheelchair)?: Total Help needed standing up from a chair using your arms (e.g., wheelchair or bedside chair)?: Total Help needed to walk in hospital room?: Total Help needed climbing 3-5 steps with a railing? : Total 6 Click Score: 8    End of Session Equipment Utilized During Treatment: Gait belt Activity Tolerance: Patient tolerated treatment well Patient left: in chair;with call bell/phone within reach;with chair alarm set;with family/visitor present Nurse Communication: Mobility status PT Visit Diagnosis: Other abnormalities of gait and mobility (R26.89);Pain Pain - part of body:  (back)    Time: 0109-3235 PT Time Calculation (min) (ACUTE ONLY): 33 min   Charges:   PT Evaluation $PT Eval Moderate Complexity: 1 Mod          Jennie Hannay B. Migdalia Dk PT, DPT Acute Rehabilitation Services Please use secure chat or  Call Office [phone]   [place] 05/03/2022, 1:36 PM

## 2022-05-03 NOTE — Evaluation (Signed)
Clinical/Bedside Swallow Evaluation Patient Details  Name: [name] [name] MRN: [id number] Date of Birth: May 21, 1961  Today's Date: 05/03/2022 Time: SLP Start Time (ACUTE ONLY): 65 SLP Stop Time (ACUTE ONLY): 0737 SLP Time Calculation (min) (ACUTE ONLY): 20 min  Past Medical History:  Past Medical History:  Diagnosis Date   Abnormal CT scan, gastrointestinal tract    Acute respiratory failure ([place]) 01/31/2022   Acute respiratory failure with hypoxemia ([place]) 02/11/2022   Anemia    Anemia 08/06/2012   Aortic valve mass 12/30/2015   Bilateral shoulder pain 08/20/2012   Bite wound of left hand 09/27/2021   Cardiogenic shock ([place]) 01/31/2022   Community acquired pneumonia 11/07/2011   Critical limb ischemia with history of revascularization of same extremity ([place]) 04/28/2020   DDD (degenerative disc disease), lumbosacral     and grade 2 slip   DVT (deep venous thrombosis) ([place]) 01/31/2022   GERD (gastroesophageal reflux disease)    H/O: stroke 01/31/2022   Heart murmur    History of anemia    no current med.   History of COVID-19 05/10/2019   History of critical lower limb ischemia 02/19/2019   Hyperglycemia 01/31/2022   Hypertension    states is borderline on medication; has been on med. x 5-6 yr.   Hypokalemia 05/30/2018   Impingement syndrome of shoulder region 10/2014   left   Insomnia 06/01/2015   Ischemic ulcer of toe of left foot (H[facility])    Lactic acidosis 01/31/2022   Left shoulder pain 12/16/2015   Liver mass    Low back pain without sciatica 10/29/2009   Lupus ([place])    Multiple fractures of ribs, bilateral, due to CPR trauma 01/31/2022   Onychomycosis 01/28/2021   Paronychia of great toe 01/28/2021   Peripheral vascular disease ([place])    Pneumonia    Pressure injury of skin 01/31/2022   Pulmonary contusion 01/31/2022   RA (rheumatoid arthritis) ([place])    Rotator cuff tear 11/04/2014   STEMI (ST elevation myocardial infarction) ([place]) 01/31/2022   Sternal  fracture 01/31/2022   Stroke ([place])    Transaminitis 12/16/2015   Past Surgical History:  Past Surgical History:  Procedure Laterality Date   ABDOMINAL AORTAGRAM  02/20/2019   ABDOMINAL AORTOGRAM W/LOWER EXTREMITY N/A 02/20/2019   Procedure: ABDOMINAL AORTOGRAM W/LOWER EXTREMITY;  Surgeon: Marty Heck, MD;  Location: [facility];  Service: Cardiovascular;  Laterality: N/A;   ACHILLES TENDON SURGERY Bilateral    AMPUTATION Left 05/11/2020   Procedure: LEFT ABOVE KNEE AMPUTATION;  Surgeon: Angelia Mould, MD;  Location: [facility];  Service: Vascular;  Laterality: Left;   ENDARTERECTOMY POPLITEAL Left 05/01/2020   Procedure: ENDARTERECTOMY POPLITEAL;  Surgeon: Waynetta Sandy, MD;  Location: [facility];  Service: Vascular;  Laterality: Left;   FASCIOTOMY Left 05/01/2020   Procedure: FASCIOTOMY;  Surgeon: Waynetta Sandy, MD;  Location: [facility];  Service: Vascular;  Laterality: Left;   FEMORAL-POPLITEAL BYPASS GRAFT Left 02/26/2019   Procedure: BYPASS GRAFT FEMORAL-POPLITEAL ARTERY LEFT LEG USING 42m PROPATEN GRAFT;  Surgeon: BSerafina Mitchell MD;  [facility];  Service: Vascular;  Laterality: Left;   INTRAOPERATIVE ARTERIOGRAM Left 01/22/2020   Procedure: INTRA OPERATIVE ARTERIOGRAM with administration of thrombolyics in Left femoral to popliteal bypass.;  Surgeon: CMarty Heck MD;  [facility]  Service: Vascular;  Laterality: Left;   IR IMAGING GUIDED PORT INSERTION  03/07/2022   LEFT HEART CATH AND CORONARY ANGIOGRAPHY N/A 03/15/2017   Procedure: LEFT HEART CATH AND CORONARY  ANGIOGRAPHY;  Surgeon: Nelva Bush, MD;  Location: [facility];  Service: Cardiovascular;  Laterality: N/A;   LEFT HEART CATH AND CORONARY ANGIOGRAPHY N/A 02/06/2022   Procedure: LEFT HEART CATH AND CORONARY ANGIOGRAPHY;  Surgeon: Martinique, Peter M, MD;  Location: [facility];  Service: Cardiovascular;  Laterality: N/A;   LOWER EXTREMITY INTERVENTION Left  04/29/2020   Procedure: LOWER EXTREMITY INTERVENTION- LYSIS;  Surgeon: Marty Heck, MD;  Location: [facility];  Service: Cardiovascular;  Laterality: Left;   OSTEOTOMY AND ULNAR SHORTENING Right 07/22/2002   PATCH ANGIOPLASTY Left 05/01/2020   Procedure: PATCH ANGIOPLASTY USING HEMASHIELD PLATINUM FINESSE PATCH;  Surgeon: Waynetta Sandy, MD;  Location: [facility];  Service: Vascular;  Laterality: Left;   PERIPHERAL VASCULAR ATHERECTOMY  01/23/2020   Procedure: PERIPHERAL VASCULAR ATHERECTOMY;  Surgeon: Marty Heck, MD;  Location: [facility];  Service: Cardiovascular;;   PERIPHERAL VASCULAR BALLOON ANGIOPLASTY  01/23/2020   Procedure: PERIPHERAL VASCULAR BALLOON ANGIOPLASTY;  Surgeon: Marty Heck, MD;  Location: [facility];  Service: Cardiovascular;;   PERIPHERAL VASCULAR INTERVENTION Left 04/30/2020   Procedure: PERIPHERAL VASCULAR INTERVENTION;  Surgeon: Cherre Robins, MD;  Location: [facility];  Service: Cardiovascular;  Laterality: Left;   PERIPHERAL VASCULAR THROMBECTOMY N/A 01/23/2020   Procedure: lysis recheck;  Surgeon: Marty Heck, MD;  Location: [facility];  Service: Cardiovascular;  Laterality: N/A;  + [place]    PERIPHERAL VASCULAR THROMBECTOMY N/A 04/30/2020   Procedure: Lysis Recheck;  Surgeon: Cherre Robins, MD;  Location: [facility];  Service: Cardiovascular;  Laterality: N/A;   SHOULDER ARTHROSCOPY WITH BICEPSTENOTOMY Right 03/11/2015   Procedure: SHOULDER ARTHROSCOPY WITH BICEPSTENOTOMY;  Surgeon: Ninetta Lights, MD;  Location: [facility];  Service: Orthopedics;  Laterality: Right;   SHOULDER ARTHROSCOPY WITH DISTAL CLAVICLE RESECTION Left 11/19/2014   Procedure: SHOULDER ARTHROSCOPY WITH DISTAL CLAVICLE RESECTION;  Surgeon: Kathryne Hitch, MD;  Location: [facility];  Service: Orthopedics;  Laterality: Left;   SHOULDER ARTHROSCOPY WITH ROTATOR CUFF REPAIR AND  SUBACROMIAL DECOMPRESSION Left 11/19/2014   Procedure: LEFT SHOULDER ARTHROSCOPY, DEBRIDEMENT DISTAL CLAVICLE EXCISION, ACROMIOPLASTY WITH ROTATOR CUFF REPAIR ;  Surgeon: Kathryne Hitch, MD;  Location: [facility];  Service: Orthopedics;  Laterality: Left;   THROMBECTOMY OF BYPASS GRAFT FEMORAL- POPLITEAL ARTERY Left 05/01/2020   Procedure: THROMBECTOMY OF LOWER EXTREMITY;  Surgeon: Waynetta Sandy, MD;  Location: [facility];  Service: Vascular;  Laterality: Left;   TRANSFORAMINAL LUMBAR INTERBODY FUSION (TLIF) WITH PEDICLE SCREW FIXATION 1 LEVEL N/A 01/03/2018   Procedure: TRANSFORAMINAL LUMBAR INTERBODY FUSION (TLIF) LUMBAR FIVE-SACRAL ONE;  Surgeon: Melina Schools, MD;  Location: [facility];  Service: Orthopedics;  Laterality: N/A;   ULTRASOUND GUIDANCE FOR VASCULAR ACCESS Right 01/22/2020   Procedure: ULTRASOUND GUIDANCE FOR VASCULAR ACCESS Right Common Femoral Artery.;  Surgeon: Marty Heck, MD;  Location: [facility];  Service: Vascular;  Laterality: Right;   VIDEO ASSISTED THORACOSCOPY (VATS)/DECORTICATION Right 11/21/2011   drainage of empyema   HPI:  61yo male admitted 04/29/22 with chest pain after a fall off his whelchair. PMH: metastatic adenocarcinoma (esophageal primary suspected), on chemo (started 04/08/22), multiple DVT/PE, HTN, HLD, CAD s/p CABG (2020), inferior STEMI (01/2022), CVA, PNA. CXR = chronic lung changes without acute findings    Assessment / Plan / Recommendation  Clinical Impression  Pt seen at bedside for assessment of swallow function and safety. Pt reports no difficulty swallowing. He is  edentulous, and indicates he does not wear his dentures. CN exam is unremarkable, however, visualization of the oral cavity indicated an area of dark pink/purple that did not appear to be swollen, and was not reported to be painful. Pt tolerated trials of thin liquid, puree, and solid textures without obvious oral difficulty or overt s/s aspiration. Pt reports he has no  difficulty with esophageal clearing. At this time, recommend regular diet and thin liquids, meds as tolerated. No further ST intervention recommended at this time. Please reconsult if needs arise. RN and MD informed.  SLP Visit Diagnosis: Dysphagia, unspecified (R13.10)    Aspiration Risk  Mild aspiration risk    Diet Recommendation Regular;Thin liquid   Liquid Administration via: Cup;Straw Medication Administration: Whole meds with liquid Supervision: Patient able to self feed;Comment (assist with set up) Compensations: Slow rate;Small sips/bites Postural Changes: Seated upright at 90 degrees;Remain upright for at least 30 minutes after po intake    Other  Recommendations Oral Care Recommendations: Oral care BID    Recommendations for follow up therapy are one component of a multi-disciplinary discharge planning process, led by the attending physician.  Recommendations may be updated based on patient status, additional functional criteria and insurance authorization.  Assistance Recommended at Discharge None  Functional Status Assessment Patient has not had a recent decline in their functional status      Prognosis Prognosis for Safe Diet Advancement: Good      Swallow Study   General Date of Onset: 04/29/22 HPI: 61yo male admitted 04/29/22 with chest pain after a fall off his whelchair. PMH: metastatic adenocarcinoma (esophageal primary suspected), on chemo (started 04/08/22), multiple DVT/PE, HTN, HLD, CAD s/p CABG (2020), inferior STEMI (01/2022), CVA, PNA. CXR = chronic lung changes without acute findings Type of Study: Bedside Swallow Evaluation Previous Swallow Assessment: none Diet Prior to this Study: Other (Comment) (full liquids) Temperature Spikes Noted: No Respiratory Status: Room air History of Recent Intubation: No Behavior/Cognition: Alert;Cooperative;Pleasant mood Oral Cavity Assessment: Within Functional Limits Oral Care Completed by SLP: No Oral Cavity -  Dentition: Edentulous Vision: Functional for self-feeding Self-Feeding Abilities: Able to feed self;Needs set up Patient Positioning: Upright in bed Baseline Vocal Quality: Normal Volitional Cough: Weak Volitional Swallow: Able to elicit    Oral/Motor/Sensory Function Overall Oral Motor/Sensory Function: Within functional limits   Ice Chips Ice chips: Not tested   Thin Liquid Thin Liquid: Within functional limits Presentation: Straw    Nectar Thick Nectar Thick Liquid: Not tested   Honey Thick Honey Thick Liquid: Not tested   Puree Puree: Within functional limits Presentation: Self Fed;Spoon   Solid     Solid: Within functional limits Presentation: May[place] B. Quentin Ore, [facility], [place] Speech Language Pathologist Office: [phone]  Shonna Chock 05/03/2022,2:16 PM

## 2022-05-03 NOTE — Progress Notes (Signed)
Mobility Specialist - Progress Note   05/03/22 1112  Mobility  Activity Transferred from chair to bed  Level of Assistance Maximum assist, patient does 25-49%  Assistive Device None  Activity Response Tolerated well  Mobility Referral No  $Mobility charge 1 Mobility   Pt was received in chair requesting to get back to bed. Pt was max assist for both standing from chair and pivoting to bed. Pt was left in bed with all needs met.   Larey Seat

## 2022-05-04 ENCOUNTER — Inpatient Hospital Stay: Payer: Medicare Other

## 2022-05-04 ENCOUNTER — Other Ambulatory Visit: Payer: Self-pay

## 2022-05-04 ENCOUNTER — Encounter: Payer: Self-pay | Admitting: *Deleted

## 2022-05-04 DIAGNOSIS — C801 Malignant (primary) neoplasm, unspecified: Secondary | ICD-10-CM | POA: Diagnosis not present

## 2022-05-04 DIAGNOSIS — A498 Other bacterial infections of unspecified site: Secondary | ICD-10-CM

## 2022-05-04 DIAGNOSIS — W19XXXA Unspecified fall, initial encounter: Secondary | ICD-10-CM | POA: Diagnosis not present

## 2022-05-04 DIAGNOSIS — Y92009 Unspecified place in unspecified non-institutional (private) residence as the place of occurrence of the external cause: Secondary | ICD-10-CM | POA: Diagnosis not present

## 2022-05-04 DIAGNOSIS — C787 Secondary malignant neoplasm of liver and intrahepatic bile duct: Secondary | ICD-10-CM | POA: Diagnosis not present

## 2022-05-04 LAB — BASIC METABOLIC PANEL
Anion gap: 9 (ref 5–15)
BUN: 8 mg/dL (ref 8–23)
CO2: 23 mmol/L (ref 22–32)
Calcium: 8.1 mg/dL — ABNORMAL LOW (ref 8.9–10.3)
Chloride: 102 mmol/L (ref 98–111)
Creatinine, Ser: 0.78 mg/dL (ref 0.61–1.24)
GFR, Estimated: >60 mL/min (ref >60)
Glucose, Bld: 89 mg/dL (ref 70–99)
Potassium: 3.6 mmol/L (ref 3.5–5.1)
Sodium: 134 mmol/L — ABNORMAL LOW (ref 135–145)

## 2022-05-04 LAB — CBC
HCT: 28.9 % — ABNORMAL LOW (ref 39.0–52.0)
Hemoglobin: 8.9 g/dL — ABNORMAL LOW (ref 13.0–17.0)
MCH: 25.4 pg — ABNORMAL LOW (ref 26.0–34.0)
MCHC: 30.8 g/dL (ref 30.0–36.0)
MCV: 82.3 fL (ref 80.0–100.0)
Platelets: 171 10*3/uL (ref 150–400)
RBC: 3.51 MIL/uL — ABNORMAL LOW (ref 4.22–5.81)
RDW: 19.3 % — ABNORMAL HIGH (ref 11.5–15.5)
WBC: 7.9 10*3/uL (ref 4.0–10.5)
nRBC: 0 % (ref 0.0–0.2)

## 2022-05-04 MED ORDER — HEPARIN SOD (PORK) LOCK FLUSH 100 UNIT/ML IV SOLN
500.0000 [IU] | INTRAVENOUS | Status: AC | PRN
  Administered 2022-05-04: 500 [IU]
  Filled 2022-05-04: qty 5

## 2022-05-04 MED ORDER — CEFADROXIL 500 MG PO CAPS: 1000.0000 mg | ORAL_CAPSULE | Freq: Two times a day (BID) | ORAL | 0 refills | Status: AC

## 2022-05-04 NOTE — Discharge Summary (Signed)
Name: [name] [name] MRN: [id number] DOB: 1960-09-26 61 y.o. PCP: Angelique Blonder, DO  Date of Admission: 04/29/2022  8:59 PM Date of Discharge: 05/04/2022 Attending Physician: Dr.  Johnnye Sima  Discharge Diagnosis: Klebsiella pneumonia bacteremia Encephalopathy secondary to bacteremia Fall secondary to encephalopathy with possible syncope Right orbital wall/maxillary sinus/zygomatic arch fractures Anemia/thrombocytopenia Adenocarcinoma of the esophagus with liver metastasis Transaminitis and elevated alkaline phosphatase CAD DVT/PE PAD s/p left AKA    Discharge Medications: Allergies as of 05/04/2022       Reactions   Dilaudid [hydromorphone Hcl] Rash   Ramipril Rash, Other (See Comments)   Per patient on R arm and leg only; no angioedema   Tramadol Itching, Rash        Medication List     STOP taking these medications    clopidogrel 75 MG tablet Commonly known as: Plavix   Eliquis 5 MG Tabs tablet Generic drug: apixaban       TAKE these medications    atorvastatin 40 MG tablet Commonly known as: LIPITOR Take 1 tablet (40 mg total) by mouth daily at 6 PM.   cefadroxil 500 MG capsule Commonly known as: DURICEF Take 2 capsules (1,000 mg total) by mouth 2 (two) times daily for 21 doses.   gabapentin 100 MG capsule Commonly known as: NEURONTIN Take 100 mg by mouth See admin instructions. Daily or twice daily if tolerated.   isosorbide mononitrate 30 MG 24 hr tablet Commonly known as: IMDUR Take 1 tablet (30 mg total) by mouth daily.   metoprolol succinate 25 MG 24 hr tablet Commonly known as: TOPROL-XL Take 1 tablet (25 mg total) by mouth daily.   oxyCODONE-acetaminophen 10-325 MG tablet Commonly known as: Percocet Take 1 tablet by mouth every 6 (six) hours as needed for pain.   prochlorperazine 10 MG tablet Commonly known as: COMPAZINE TAKE 1 TABLET(10 MG) BY MOUTH EVERY 6 HOURS AS NEEDED FOR NAUSEA OR VOMITING What changed: See the new  instructions.               Durable Medical Equipment  (From admission, onward)           Start     Ordered   05/04/22 0000  For home use only DME Other see comment       Comments: Waist strap to keep patient in wheelchair.  Question:  Length of Need  Answer:  Lifetime   05/04/22 1341            Disposition and follow-up:   [name] [name] was [facility] in Good condition.  At the hospital follow up visit please address:  1.  Follow-up:  a.  Evaluate for any further bleeding and repeat a CBC.  If this is stable please resume Eliquis and Plavix.    b.  Evaluate mental status for any signs of encephalopathy.  Patient was alert and oriented x2 prior to discharge, he could not consistently tell us that it was 2023.  Otherwise he was oriented to self, place, situation.   c.  Ensure that he is completed or is continuing his antibiotic course and that he has not had any repeat fevers or other symptoms of bacteremia.  2.  Labs / imaging needed at time of follow-up: CBC, CMP  3.  Pending labs/ test needing follow-up: Blood cultures  4.  Medication Changes Hold Eliquis and Plavix until clinic follow-up and stable CBC Cefadroxil 1000 mg twice daily until 05/14/2022  Follow-up Appointments:  Follow-up Information     Delene Ruffini, MD Follow up on 05/08/2022.   Specialty: Internal Medicine Why: at 2:45 PM Contact information: [place] [address] [place], [place] Follow up.   Specialty: Home Health Services Why: Physical and Occupational Therapy-office to visit on 05-05-22 Contact information: [place] [address] [phone]                 Hospital Course by problem list: KREED KAUFFMAN is a 61 y.o. with a pertinent PMH of CAD s/p MI c/b Vfib arrest 01/31/2022, LHC 100% occluded prox RCA w/o intervention, NSTEMI (type 2)  (04/09/2022), PE/DVT on chronic AC (01/2022, 12/2021) c/b chronic RV dysfxn, PAD s/p L AKA (04/2020), metastatic adenocarcinoma likely esophageal primary, CVA (02/2022), COPD, HTN, and HLD who presented with chest pain after falling.   Klebsiella pneumoniae bacteremia Encephalopathy 2/2 bacteremia Patient presented encephalopathic secondary to what was found to be Klebsiella pneumonia bacteremia with the most likely source being a liver metastasis from his esophageal adenocarcinoma.  He has previously had E. coli bacteremia with the same likely nidus.  He was started on ceftriaxone and then narrowed to cefazolin with culture sensitivities.  Antibiotics were started on 04/30/2022.  Fortunately his encephalopathy improved rapidly after starting antibiotics and his leukocytosis quickly resolved.  He had 1 fever of 100.8 but was otherwise afebrile after admission.  With his history of E. coli bacteremia and new Klebsiella pneumonia bacteremia we were concerned for liver or other intra-abdominal abscess.  CT of the abdomen pelvis with contrast did not show any evidence of an abscess but did show evidence of chronic pancreatitis and a interval enlargement of pancreatic pseudocyst, otherwise was largely unchanged.  After his encephalopathy resolved and with the most likely source of infection being the liver metastasis he was prepared for discharge.  Unfortunately his liver metastasis cannot be drained so we will continue with antibiotic therapy for 2 weeks total, stop date 05/14/2022.  Patient was discharged with cefadroxil to complete this antibiotic course.     Fall with facial fractures Syncope Patient presented after a fall out of his wheelchair with facial trauma.  CT of the head/cervical spine/maxillofacial showed fractures right orbit, maxillary sinus, and zygomatic arch with hematoma in the maxillary sinus.  Plastic surgery was consulted and determined that there was no need for surgical intervention at that  time.  He was encephalopathic as above on admission but this did improve with antibiotics and he was at baseline prior to discharge.  He had gradually improving pain secondary to the fractures on discharge and has Percocet 10 at home for chronic back pain which he will continue to use.  No evidence of globe injury or extraocular muscle entrapment on CT scans and no symptoms of this on serial physical exam during admission.   Anemia/thrombocytopenia Coffee-ground emesis Hemoglobin on admission of 12.4 dropped to 8.3 and remained stable for 3 days prior to discharge.  Appears that recent baseline of hemoglobin between 10 and 12.  Presented with multiple episodes of coffee-ground emesis which are secondary to maxillary sinus hematoma leaking into the pharynx and causing emesis.  No bloody stools or melena.  Eliquis and Plavix were held during admission.  Iron studies show low iron and saturation.  We held off on starting iron therapy due to bacteremia but recommend that he starts this outpatient.   Adenocarcinoma of Esophageal w/ liver  metastasis  Followed by Dr. Benay Spice.  Most likely cause of bacteremia above is the liver metastasis.  Liver biopsy in 0/1749 while complicated with E.coli bacteremia showed adenocarcinoma.  CT abdomen pelvis showed no signs of new metastasis.  Dr. Benay Spice plans to resume therapy and/or discuss hospice on follow-up.   Transaminitis Elevated alk phos Transaminitis with ALT of 98 and AST of 154 with alk phos of 455 and normal total bili on admission.  Transaminitis resolved quickly but alk phos remains elevated.  This is noted and is likely a chronic finding as he has known liver metastasis and pancreatic pseudocyst.     CAD/DVT/PE/PAD s/p left AKA Chronic problems were stable during admission.  He was continued on his statin but we held the Eliquis and Plavix in the setting of bleeding with plans to restart these on clinic follow-up, if CBC stable.    Discharge  Subjective: Patient is doing well this morning without any return of his symptoms.  He states he slept well and although his appetite is low this does happen sometimes at home.  He denies any new or worsening issues.  He wants to go home and his friend says that he is near his baseline.  Discharge Exam:   BP (!) 112/95 (BP Location: Left Arm)   Pulse 87   Temp 98.5 F (36.9 C) (Oral)   Resp 17   Ht _0  (1.702 m)   Wt 62.9 kg   SpO2 98%   BMI 21.72 kg/m  Constitutional: Ill-appearing elderly gentleman sitting in bed, in no acute distress HENT: normocephalic, ecchymosis around the right eye Eyes: conjunctiva non-erythematous, EOM intact without pain Cardiovascular: regular rate and rhythm Pulmonary/Chest: normal work of breathing on room air, lungs clear to auscultation bilaterally Abdominal: soft, non-tender, non-distended, positive bowel sounds MSK: normal bulk and tone, stable left AKA with no lower extremity edema Neurological: alert & oriented x 2 (self, place, and situation) Skin: warm and dry  Pertinent Labs, Studies, and Procedures:     Latest Ref Rng & Units 05/04/2022   12:20 PM 05/03/2022    4:09 AM 05/02/2022    6:37 PM  CBC  WBC 4.0 - 10.5 K/uL 7.9  7.9  7.0   Hemoglobin 13.0 - 17.0 g/dL 8.9  8.3  8.4   Hematocrit 39.0 - 52.0 % 28.9  27.0  27.0   Platelets 150 - 400 K/uL 171  138  119        Latest Ref Rng & Units 05/04/2022   12:20 PM 05/03/2022    4:09 AM 05/02/2022    7:56 AM  CMP  Glucose 70 - 99 mg/dL 89  81  136   BUN 8 - 23 mg/dL _1 Creatinine 0.61 - 1.24 mg/dL 0.78  0.86  0.90   Sodium 135 - 145 mmol/L 134  134  137   Potassium 3.5 - 5.1 mmol/L 3.6  4.0  3.3   Chloride 98 - 111 mmol/L 102  105  108   CO2 22 - 32 mmol/L _2 Calcium 8.9 - 10.3 mg/dL 8.1  8.0  8.0   Total Protein 6.5 - 8.1 g/dL  5.7  5.7   Total Bilirubin 0.3 - 1.2 mg/dL  0.5  0.5   Alkaline Phos 38 - 126 U/L  228  241   AST 15 - 41 U/L  25  32   ALT 0 - 44 U/L  25  31     CT Maxillofacial Wo Contrast  Result Date: 04/29/2022 CLINICAL DATA:  Fall today with blunt facial trauma. EXAM: CT MAXILLOFACIAL WITHOUT CONTRAST TECHNIQUE: Multidetector CT imaging of the maxillofacial structures was performed. Multiplanar CT image reconstructions were also generated. RADIATION DOSE REDUCTION: This exam was performed according to the departmental dose-optimization program which includes automated exposure control, adjustment of the mA and/or kV according to patient size and/or use of iterative reconstruction technique. COMPARISON:  Head CT earlier today. FINDINGS: Osseous: Acute nondisplaced fracture of the right zygomatic arch. No acute fracture of the nasal bones or mandibles. In intact left zygomatic arch. Patient is edentulous. The temporomandibular joints are congruent. Patient is edentulous. Intact pterygoid plates. Orbits: Acute right orbital fractures involve the inferior and lateral orbital walls. Lateral wall fracture is comminuted and displaced. The inferior wall fracture is mildly displaced. No evidence of globe injury. No extraocular muscle entrapment. Intact left orbit and globe. Sinuses: Comminuted and mildly displaced fractures of the right maxillary sinus, involving anterior, lateral, superior and medial walls. Right maxillary hemosinus. Mucosal thickening throughout right ethmoid air cells. Soft tissues: Right cheek soft tissue edema. Right supraorbital soft tissue hematoma. Small amount of subcutaneous emphysema related to maxillary sinus fracture Limited intracranial: Assessed on head CT earlier today. No hemorrhage. IMPRESSION: 1. Acute right orbital fractures involve the inferior and lateral orbital walls. Lateral wall fracture is comminuted and displaced. The inferior wall fracture is mildly displaced. No evidence of globe injury or extraocular muscle entrapment. 2. Comminuted and mildly displaced fractures of the right maxillary sinus, involving anterior,  lateral, superior and medial walls. Right maxillary hemosinus. 3. Acute nondisplaced fracture of the right zygomatic arch. Electronically Signed   By: Keith Rake M.D.   On: 04/29/2022 23:09   CT HEAD WO CONTRAST  Result Date: 04/29/2022 CLINICAL DATA:  Head trauma after fall EXAM: CT HEAD WITHOUT CONTRAST CT CERVICAL SPINE WITHOUT CONTRAST TECHNIQUE: Multidetector CT imaging of the head and cervical spine was performed following the standard protocol without intravenous contrast. Multiplanar CT image reconstructions of the cervical spine were also generated. RADIATION DOSE REDUCTION: This exam was performed according to the departmental dose-optimization program which includes automated exposure control, adjustment of the mA and/or kV according to patient size and/or use of iterative reconstruction technique. COMPARISON:  CT head 03/11/2022 FINDINGS: CT HEAD FINDINGS Brain: No intracranial hemorrhage, mass effect, or evidence of acute infarct. No hydrocephalus. No extra-axial fluid collection. Generalized cerebral atrophy. Ill-defined hypoattenuation within the cerebral white matter is nonspecific but consistent with chronic small vessel ischemic disease. Right cerebellar chronic infarcts. Vascular: No hyperdense vessel. Intracranial arterial calcification. Skull: No skull fracture. Sinuses/Orbits: Acute mildly displaced fractures of the right lateral and inferior orbital walls. No definite entrapment. Acute mildly displaced comminuted fractures of the anterior and posterior right maxillary sinus walls. Frothy fluid with air-fluid level in the right maxillary sinus likely due to hemosinus. The globes are intact. Other: Large soft tissue hematoma lateral to the right orbit. CT CERVICAL SPINE FINDINGS Alignment: Normal. Skull base and vertebrae: No acute fracture. No primary bone lesion or focal pathologic process. Soft tissues and spinal canal: No prevertebral fluid or swelling. No visible canal hematoma.  Disc levels: Multilevel spondylosis and disc space height loss greatest at C5-C7 where it is moderate. Posterior disc osteophyte complexes at C5-C6 and C6-C7 cause mild effacement of the ventral thecal sac. No high-grade spinal canal narrowing. Uncovertebral spurring and facet arthropathy cause advanced neural foraminal narrowing bilaterally at C5-C6 at  C6-C7. Upper chest: No acute abnormality. Other: Carotid bulb calcifications. IMPRESSION: 1. Acute comminuted displaced fractures of the right inferior and lateral orbital walls and anterior and posterior right maxillary sinus walls. The inferior aspect of the maxillary sinuses is not included in the exam. Consider maxillofacial CT for further evaluation. 2. No acute intracranial hemorrhage. 3. No cervical spine fracture. Electronically Signed   By: Placido Sou M.D.   On: 04/29/2022 22:00   CT CERVICAL SPINE WO CONTRAST  Result Date: 04/29/2022 CLINICAL DATA:  Head trauma after fall EXAM: CT HEAD WITHOUT CONTRAST CT CERVICAL SPINE WITHOUT CONTRAST TECHNIQUE: Multidetector CT imaging of the head and cervical spine was performed following the standard protocol without intravenous contrast. Multiplanar CT image reconstructions of the cervical spine were also generated. RADIATION DOSE REDUCTION: This exam was performed according to the departmental dose-optimization program which includes automated exposure control, adjustment of the mA and/or kV according to patient size and/or use of iterative reconstruction technique. COMPARISON:  CT head 03/11/2022 FINDINGS: CT HEAD FINDINGS Brain: No intracranial hemorrhage, mass effect, or evidence of acute infarct. No hydrocephalus. No extra-axial fluid collection. Generalized cerebral atrophy. Ill-defined hypoattenuation within the cerebral white matter is nonspecific but consistent with chronic small vessel ischemic disease. Right cerebellar chronic infarcts. Vascular: No hyperdense vessel. Intracranial arterial  calcification. Skull: No skull fracture. Sinuses/Orbits: Acute mildly displaced fractures of the right lateral and inferior orbital walls. No definite entrapment. Acute mildly displaced comminuted fractures of the anterior and posterior right maxillary sinus walls. Frothy fluid with air-fluid level in the right maxillary sinus likely due to hemosinus. The globes are intact. Other: Large soft tissue hematoma lateral to the right orbit. CT CERVICAL SPINE FINDINGS Alignment: Normal. Skull base and vertebrae: No acute fracture. No primary bone lesion or focal pathologic process. Soft tissues and spinal canal: No prevertebral fluid or swelling. No visible canal hematoma. Disc levels: Multilevel spondylosis and disc space height loss greatest at C5-C7 where it is moderate. Posterior disc osteophyte complexes at C5-C6 and C6-C7 cause mild effacement of the ventral thecal sac. No high-grade spinal canal narrowing. Uncovertebral spurring and facet arthropathy cause advanced neural foraminal narrowing bilaterally at C5-C6 at C6-C7. Upper chest: No acute abnormality. Other: Carotid bulb calcifications. IMPRESSION: 1. Acute comminuted displaced fractures of the right inferior and lateral orbital walls and anterior and posterior right maxillary sinus walls. The inferior aspect of the maxillary sinuses is not included in the exam. Consider maxillofacial CT for further evaluation. 2. No acute intracranial hemorrhage. 3. No cervical spine fracture. Electronically Signed   By: Placido Sou M.D.   On: 04/29/2022 22:00   DG Pelvis Portable  Result Date: 04/29/2022 CLINICAL DATA:  Trauma EXAM: PORTABLE PELVIS 1-2 VIEWS COMPARISON:  None Available. FINDINGS: No fracture or dislocation is seen. Bilateral hip joint spaces are preserved. Visualized bony pelvis appears intact. L5-S1 fixation hardware. IMPRESSION: Negative. Electronically Signed   By: Julian Hy M.D.   On: 04/29/2022 21:50   DG Chest Port 1 View  Result  Date: 04/29/2022 CLINICAL DATA:  Trauma EXAM: PORTABLE CHEST 1 VIEW COMPARISON:  04/09/2022 FINDINGS: Lungs are clear.  No pleural effusion or pneumothorax. The heart is top-normal in size.  Thoracic aortic atherosclerosis. Right chest power port terminates at the cavoatrial junction. IMPRESSION: No evidence of acute cardiopulmonary disease. Electronically Signed   By: Julian Hy M.D.   On: 04/29/2022 21:50     Discharge Instructions: Discharge Instructions     Call MD for:  difficulty breathing, headache  or visual disturbances   Complete by: As directed    Call MD for:  extreme fatigue   Complete by: As directed    Call MD for:  persistant dizziness or light-headedness   Complete by: As directed    Call MD for:  persistant nausea and vomiting   Complete by: As directed    Call MD for:  redness, tenderness, or signs of infection (pain, swelling, redness, odor or green/yellow discharge around incision site)   Complete by: As directed    Call MD for:  severe uncontrolled pain   Complete by: As directed    Call MD for:  temperature >100.4   Complete by: As directed    Diet - low sodium heart healthy   Complete by: As directed    For home use only DME Other see comment   Complete by: As directed    Waist strap to keep patient in wheelchair.   Length of Need: Lifetime   Increase activity slowly   Complete by: As directed        Signed: [name], DO 05/04/2022, 3:19 PM   Pager: [phone]

## 2022-05-04 NOTE — Discharge Instructions (Addendum)
You were hospitalized for care after a fall with a head injury and bacteremia. Thank you for allowing [place] to be part of your care.   We arranged for you to follow up at:  [facility] on 05/08/2022 at 2:45 PM   Please note these changes made to your medications:   Please START taking:  Cefadroxil 1000 mg twice daily for 10 more days. Last day of antibiotics will be 05/14/2022.  Please STOP taking until you follow up with our clinic: Plavix Eliquis   Please make sure to watch for return of fevers, confusion, bleeding, or vision changes. Call our office or come to the hospital if you have concerning symptoms.   Please call our clinic if you have any questions or concerns, we may be able to help and keep you from a long and expensive emergency room wait. Our clinic and after hours phone number is [phone], the best time to call is Monday through Friday 9 am to 4 pm but there is always someone available 24/7 if you have an emergency. If you need medication refills please notify your pharmacy one week in advance and they will send [place] a request.    [name], [place]

## 2022-05-04 NOTE — Progress Notes (Signed)
Per Dr. Benay Spice: Will d/c from hospital soon. Need lab/flush/OV/chemo week of 11/13 or on 11/20. Scheduling message sent.

## 2022-05-04 NOTE — TOC Transition Note (Signed)
Transition of Care [facility]) - CM/SW Discharge Note   Patient Details  Name: [name] [name] MRN: [id number] Date of Birth: [birth year]  Transition of Care [facility]) CM/SW Contact:  Bethena Roys, RN Phone Number: 05/04/2022, 3:01 PM   Clinical Narrative:  Case Manager provided patient with Medicare.gov list. Patient has used [facility] [place]) in the past and wants to use them again. Referral submitted to [facility] and start of care to begin 05-05-22. No further home needs identified at this time. Patient has transport home.   Final next level of care: [place] Barriers to Discharge: No Barriers Identified   Patient Goals and CMS Choice Patient states their goals for this hospitalization and ongoing recovery are:: patient wants to return home. CMS Medicare.gov Compare Post Acute Care list provided to:: Patient Choice offered to / list presented to : Patient  Discharge Plan and Services In-house Referral: NA Discharge Planning Services: CM Consult Post Acute Care Choice: Home Health            DME Agency: NA       HH Arranged: PT, OT [employer] Agency: [place] (Known as Lobbyist) Date [place]: 05/04/22 Time [place]: 1500 Representative spoke with at [place]: [place]   Readmission Risk Interventions    05/02/2022    4:00 PM 03/15/2022    1:24 PM 05/13/2020    2:32 PM  Readmission Risk Prevention Plan  Transportation Screening Complete Complete Complete  PCP or Specialist Appt within 5-7 Days   Complete  Home Care Screening   Complete  Medication Review (RN CM)   Complete  Medication Review [employer]) Complete Complete   HRI or Home Care Consult Complete Complete   SW Recovery Care/Counseling Consult Complete Complete   Palliative Care Screening Not Applicable Not [place] Complete Not Applicable

## 2022-05-04 NOTE — Plan of Care (Signed)
  Problem: Education: Goal: Knowledge of condition and prescribed therapy will improve Outcome: Adequate for Discharge   Problem: Cardiac: Goal: Will achieve and/or maintain adequate cardiac output Outcome: Adequate for Discharge   Problem: Physical Regulation: Goal: Complications related to the disease process, condition or treatment will be avoided or minimized Outcome: Adequate for Discharge   Problem: Fluid Volume: Goal: Hemodynamic stability will improve Outcome: Adequate for Discharge   Problem: Clinical Measurements: Goal: Diagnostic test results will improve Outcome: Adequate for Discharge Goal: Signs and symptoms of infection will decrease Outcome: Adequate for Discharge   Problem: Respiratory: Goal: Ability to maintain adequate ventilation will improve Outcome: Adequate for Discharge   Problem: Education: Goal: Knowledge of General Education information will improve Description: Including pain rating scale, medication(s)/side effects and non-pharmacologic comfort measures Outcome: Adequate for Discharge   Problem: Health Behavior/Discharge Planning: Goal: Ability to manage health-related needs will improve Outcome: Adequate for Discharge   Problem: Clinical Measurements: Goal: Ability to maintain clinical measurements within normal limits will improve Outcome: Adequate for Discharge Goal: Will remain free from infection Outcome: Adequate for Discharge Goal: Diagnostic test results will improve Outcome: Adequate for Discharge Goal: Respiratory complications will improve Outcome: Adequate for Discharge Goal: Cardiovascular complication will be avoided Outcome: Adequate for Discharge   Problem: Activity: Goal: Risk for activity intolerance will decrease Outcome: Adequate for Discharge   Problem: Nutrition: Goal: Adequate nutrition will be maintained Outcome: Adequate for Discharge   Problem: Coping: Goal: Level of anxiety will decrease Outcome: Adequate  for Discharge   Problem: Elimination: Goal: Will not experience complications related to bowel motility Outcome: Adequate for Discharge Goal: Will not experience complications related to urinary retention Outcome: Adequate for Discharge   Problem: Pain Managment: Goal: General experience of comfort will improve Outcome: Adequate for Discharge   Problem: Safety: Goal: Ability to remain free from injury will improve Outcome: Adequate for Discharge   Problem: Skin Integrity: Goal: Risk for impaired skin integrity will decrease Outcome: Adequate for Discharge   Problem: Acute Rehab OT Goals (only OT should resolve) Goal: Pt. Will Perform Upper Body Dressing Outcome: Adequate for Discharge Goal: Pt. Will Perform Lower Body Dressing Outcome: Adequate for Discharge Goal: Pt. Will Transfer To Toilet Outcome: Adequate for Discharge Goal: OT Additional ADL Goal #1 Outcome: Adequate for Discharge   Problem: Acute Rehab PT Goals(only PT should resolve) Goal: Pt Will Go Supine/Side To Sit Outcome: Adequate for Discharge Goal: Patient Will Transfer Sit To/From Stand Outcome: Adequate for Discharge Goal: Pt Will Transfer Bed To Chair/Chair To Bed Outcome: Adequate for Discharge

## 2022-05-04 NOTE — Progress Notes (Signed)
While providing medication to patient, patient's friend Rhetta Mura requested for his daughter's name and number be added to patient chart. Patient confirmed that he would like [facility] name and number added to the chart. Patient also requested that his sister's name and number be removed from the chart so Carey's number can be added. Instructor Janace Litten, RN completed the request and made requested changes to the chart.

## 2022-05-04 NOTE — Progress Notes (Signed)
IP PROGRESS NOTE  Subjective:   [name] [name] reports pain at the right face.  No other complaint. Objective: Vital signs in last 24 hours: Blood pressure (!) 112/95, pulse 87, temperature 98.5 F (36.9 C), temperature source Oral, resp. rate 17, height _0  (1.702 m), weight 138 lb 10.7 oz (62.9 kg), SpO2 98 %.  Intake/Output from previous day: 11/08 0701 - 11/09 0700 In: 400 [IV Piggyback:400] Out: 2000 [Urine:2000]  Physical Exam:  HEENT: Ecchymosis from the right orbit and at the right buccal mucosa  Abdomen: No hepatosplenomegaly, nontender Neurologic: Alert, follows simple commands  Portacath/PICC-without erythema  Lab Results: Recent Labs    05/03/22 0409 05/04/22 1220  WBC 7.9 7.9  HGB 8.3* 8.9*  HCT 27.0* 28.9*  PLT 138* 171    BMET Recent Labs    05/03/22 0409 05/04/22 1220  NA 134* 134*  K 4.0 3.6  CL 105 102  CO2 22 23  GLUCOSE 81 89  BUN 13 8  CREATININE 0.86 0.78  CALCIUM 8.0* 8.1*    Lab Results  Component Value Date   CEA1 287.0 (H) 02/11/2022   CEA 644.67 (H) 03/03/2022   CAN199 88 (H) 02/11/2022    Studies/Results: CT ABDOMEN PELVIS W CONTRAST  Result Date: 05/02/2022 CLINICAL DATA:  Sepsis. EXAM: CT ABDOMEN AND PELVIS WITH CONTRAST TECHNIQUE: Multidetector CT imaging of the abdomen and pelvis was performed using the standard protocol following bolus administration of intravenous contrast. RADIATION DOSE REDUCTION: This exam was performed according to the departmental dose-optimization program which includes automated exposure control, adjustment of the mA and/or kV according to patient size and/or use of iterative reconstruction technique. CONTRAST:  42m OMNIPAQUE IOHEXOL 350 MG/ML SOLN COMPARISON:  CT 03/11/2022. PET CT 03/08/2022 FINDINGS: Lower chest: Trace bilateral pleural effusions, left greater than right. The heart is mildly enlarged. Distal esophageal wall thickening. Hepatobiliary: Hypodense central hepatic lesion measures  approximately 8.3 cm, series 3, image 14, grossly unchanged from prior exam. No evidence of new hepatic lesion. No partially distended gallbladder without calcified gallstone. There is no biliary dilatation. Pancreas: Fluid collection in the pancreatic head is increased in size from prior exam, currently appearing bilobed measuring 5.2 x 5.2 cm, series 3, image 32, previously 4.3 x 2.9 cm. There is adjacent peripancreatic fat stranding that is also increased. Punctate calcifications in the pancreatic head are again seen. There is no definite ductal dilatation. Spleen: Enlarged spanning 14.8 cm cranial caudal no focal splenic abnormalities. Adrenals/Urinary Tract: Mild adrenal thickening without discrete adrenal nodule. No hydronephrosis or perinephric edema. Homogeneous renal enhancement with symmetric excretion on delayed phase imaging. No evidence of renal stone or focal abnormality. Urinary bladder is physiologically distended without wall thickening. Stomach/Bowel: Distal esophageal wall thickening that was hypermetabolic on prior PET. Wall thickening of the distal stomach and proximal duodenum is adjacent pancreatic inflammation and may be reactive. There is no small bowel obstruction with administered enteric contrast reaching the colon. No additional sites of small bowel inflammation. Normal appendix. Descending and sigmoid colonic diverticulosis. No diverticulitis. Moderate colonic stool. Vascular/Lymphatic: Prominent aortic and branch atherosclerosis. The portal and splenic veins are patent. There is effacement of the portal splenic confluence due to pancreatic pseudocyst. Mesenteric venous branches are patent. Metastatic adenopathy is again seen. Necrotic gastrohepatic lymph node measures 3.2 cm series 3, image 24, previously 2.7 cm. Necrotic nodal mass in the region of the gastroesophageal junction measures 5.1 x 4.3 cm, previously poorly defined on noncontrast exam, but appears increased from August.  There are  prominent porta hepatis nodes. Calcified periaortic lymph nodes are stable from prior exams. Small pelvic lymph nodes are stable. Reproductive: Prostate is unremarkable. Other: Mild presacral stranding again seen. Small amount of perihepatic ascites. There is increasing peripancreatic edema. No free air. Musculoskeletal: Avascular necrosis of both femoral heads. Surgical hardware in the lumbosacral junction. No focal bone lesions. IMPRESSION: 1. Increased peripancreatic edema from prior exam, suggesting acute on chronic pancreatitis. Increased size of pancreatic head pseudocyst from prior exam, currently appearing bilobed. 2. Again seen distal esophageal wall thickening. Again seen metastatic adenopathy in the upper abdomen, with increased size of necrotic nodal masses in the region of the gastroesophageal junction and in the gastrohepatic ligament. 3. Unchanged hepatic metastasis. 4. Splenomegaly. 5. Trace bilateral pleural effusions, left greater than right. 6. Colonic diverticulosis without diverticulitis. Aortic Atherosclerosis (ICD10-I70.0). Electronically Signed   By: Keith Rake M.D.   On: 05/02/2022 19:21    Medications: I have reviewed the patient's current medications.  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 -02/11/2022 CTA chest-7.8 x 8.5 cm subtle hypoattenuating mass lesion in segment IV of the liver. -02/11/2022 CT abdomen/pelvis-8.7 x 7.8 or ill-defined irregular mass in the medial segment left liver, 4.5 x 3.4 cm necrotic mass lesion just cranial to the esophagogastric junction with metastatic lymphadenopathy in the abdomen and pelvis, irregular nodular peritoneal thickening in the lower abdomen and pelvis -02/11/2022 MRI abdomen-rim-enhancing likely necrotic mass or lymph node conglomerate centered around the  gastroesophageal junction measuring 5.0 x 3.9 cm, large rim-enhancing likely necrotic liver mass centered in the anterior left lobe of the liver measuring 7.8 x 7.2 cm, enlarged gastrohepatic ligament and retroperitoneal lymph nodes -Labs from 02/11/2022-AFP 2.4, CA 19.9 88, CEA 287 -Ultrasound-guided liver biopsy performed 02/15/2022-adenocarcinoma with extensive necrosis, CK7 positive, negative for CK20, CDX2, TTF-1, and PAX8, consistent with a pancreatic, lung, or upper gastrointestinal primary -PET 03/08/2022-hypermetabolic distal esophagus mass, hypermetabolic centrally necrotic hepatic mass, hypermetabolic periportal nodes, exophytic GE junction mass without hypermetabolism-likely benign lesion, no hypermetabolism in the pancreas head, bilateral hypermetabolic adrenal gland lesions, small hypermetabolic left parotid gland lesion.  No loss of mismatch repair protein expression, HER2 3+ -CTs 03/11/2022-no change from PET 03/08/2022, known distal esophagus mass-not visualized on unenhanced CT, large hepatic metastasis and upper abdominal nodal metastases, chronic pancreatitis with 4.3 cm pseudocyst in the pancreas head -Cycle 1 FOLFOX, trastuzumab, Pembrolizumab 04/06/2022 -Cycle 2 oxaliplatin, 5-FU bolus/pump and trastuzumab held due to hospitalization following cycle 1 with possible coronary vasospasm -CT abdomen/pelvis 05/02/2022-increased peripancreatic edema, increased size of pancreas head pseudocyst, increased necrotic nodal masses at the GE junction 2.  Myocardial infarction/cardiac arrest 01/31/2022 -Echocardiogram 02/01/2022-LVEF 60-65%, right ventricle severely dilated with hypokinesis in the basal and midportion, hypokinesis in the apical portion, right ventricular systolic function moderately reduced -Echocardiogram on 05/01/2022, LVEF 50-55%, decreased right ventricular systolic function, moderately enlarged right ventricle 3.  Microcytic anemia -Labs from 02/11/2022-ferritin 151, iron 13, percent  saturation 5% 4.  DVT/pulmonary embolism-bilateral lower extremity DVTs 01/10/2022, pulmonary emboli on chest CT 01/31/2022-apixaban 5.  PAD status post AKA in 2021 6.  RA 7.  Bilateral pleural effusions -Ultrasound-guided thoracentesis performed on 02/11/2022.  Cytology negative for malignancy. 8.  COPD 9.  Admission 03/11/2022 with E. coli bacteremia Ultrasound-guided biopsy/aspiration of dominant necrotic appearing liver lesion 03/17/2022-scant fluid aspirated-negative culture, no organisms or white cells.  Pathology with necrotic tissue. 10.  Admission 04/29/2022 after a fall, multiple skull fractures  11.  Fever, hypotension-Klebsiella bacteremia on blood culture 04/30/2022    [name] [name] appears stable.  He is scheduled for discharge today.  Outpatient follow-up will be scheduled at the Cancer center to resume systemic therapy for treatment of metastatic esophagus cancer. I reviewed the admission CT images.  The dominant liver metastasis and upper abdominal lymphadenopathy appear slightly smaller by my measurement.  The echocardiogram this admission reveals no significant change in the LVEF and right ventricular failure.  I recommend continuing chemotherapy and trastuzumab.     LOS: 4 days   Betsy Coder, MD   05/04/2022, 3:50 PM

## 2022-05-04 NOTE — Progress Notes (Signed)
error 

## 2022-05-05 ENCOUNTER — Telehealth: Payer: Self-pay | Admitting: *Deleted

## 2022-05-05 ENCOUNTER — Other Ambulatory Visit: Payer: Self-pay

## 2022-05-05 ENCOUNTER — Telehealth: Payer: Self-pay

## 2022-05-05 ENCOUNTER — Other Ambulatory Visit: Payer: Self-pay | Admitting: *Deleted

## 2022-05-05 DIAGNOSIS — S0231XD Fracture of orbital floor, right side, subsequent encounter for fracture with routine healing: Secondary | ICD-10-CM | POA: Diagnosis not present

## 2022-05-05 DIAGNOSIS — S0240CD Maxillary fracture, right side, subsequent encounter for fracture with routine healing: Secondary | ICD-10-CM | POA: Diagnosis not present

## 2022-05-05 DIAGNOSIS — S02841D Fracture of lateral orbital wall, right side, subsequent encounter for fracture with routine healing: Secondary | ICD-10-CM | POA: Diagnosis not present

## 2022-05-05 DIAGNOSIS — M069 Rheumatoid arthritis, unspecified: Secondary | ICD-10-CM | POA: Diagnosis not present

## 2022-05-05 DIAGNOSIS — S0240ED Zygomatic fracture, right side, subsequent encounter for fracture with routine healing: Secondary | ICD-10-CM | POA: Diagnosis not present

## 2022-05-05 DIAGNOSIS — C159 Malignant neoplasm of esophagus, unspecified: Secondary | ICD-10-CM | POA: Diagnosis not present

## 2022-05-05 LAB — CULTURE, BLOOD (ROUTINE X 2)
Culture: NO GROWTH
Special Requests: ADEQUATE

## 2022-05-05 NOTE — Patient Outreach (Signed)
  Care Coordination [facility] Note Transition Care Management Follow-up Telephone Call Date of discharge and from where: [name] 04/30/22-05/04/22 How have you been since you were released from the hospital? Per Hiram Comber, patients caregiver, patient is eating well but is still tired. Any questions or concerns? Yes Patient has a rash on his buttocks from loose stool. Per Hiram Comber, This was present prior to his admission.  She is concerned about that it has not resolved.  Patient has appointment to be seen in the clinic on Monday, advised to keep clean and dry and if it worsens over the weekend, she should call the clinic and speak to the on call physician.  Provided phone number to [facility].  Items Reviewed: Did the pt receive and understand the discharge instructions provided? Yes  Medications obtained and verified? Yes  Other? No  Any new allergies since your discharge? No  Dietary orders reviewed? Yes Do you have support at home? Yes   Home Care and Equipment/Supplies: Were home health services ordered? yes If so, what is the name of the agency? [facility]  Has the agency set up a time to come to the patient's home? yes Were any new equipment or medical supplies ordered?  No What is the name of the medical supply agency? N/a Were you able to get the supplies/equipment? no Do you have any questions related to the use of the equipment or supplies? No  Functional Questionnaire: (I = Independent and D = Dependent) ADLs: Needs assistance  Bathing/Dressing- Needs assistance  Meal Prep- D  Eating- I  Maintaining continence- Incontinent  Transferring/Ambulation- Needs assistance  Managing Meds- Dependent  Follow up appointments reviewed:  PCP Hospital f/u appt confirmed? Yes  Scheduled to see Dr. Elliot Gurney on 05/08/22 @ 2:45. [facility] f/u appt confirmed? No   Are transportation arrangements needed? No  If their condition worsens, is the pt aware to call PCP or go to the  Emergency Dept.? Yes Was the patient provided with contact information for the PCP's office or ED? Yes Was to pt encouraged to call back with questions or concerns? Yes  SDOH assessments and interventions completed:   Yes  Care Coordination Interventions Activated:  Yes   Care Coordination Interventions:  No Care Coordination interventions needed at this time.   Encounter Outcome:  Pt. Visit Completed

## 2022-05-05 NOTE — Telephone Encounter (Signed)
Call from Eliezer Mccoy, PT [facility] needs verbal orders for PT 1 time a week for 1 week.  2 times a week x 3 weeks then 1 time a week x 3 weeks.  Reason-  Hotel manager, Safe transfer and Personnel officer. Beckie Busing  can be reached at   [phone].

## 2022-05-06 ENCOUNTER — Other Ambulatory Visit: Payer: Self-pay | Admitting: Internal Medicine

## 2022-05-08 ENCOUNTER — Encounter: Payer: Medicare Other | Admitting: Internal Medicine

## 2022-05-08 ENCOUNTER — Ambulatory Visit: Payer: Medicare Other

## 2022-05-09 DIAGNOSIS — C159 Malignant neoplasm of esophagus, unspecified: Secondary | ICD-10-CM | POA: Diagnosis not present

## 2022-05-09 DIAGNOSIS — S0240CD Maxillary fracture, right side, subsequent encounter for fracture with routine healing: Secondary | ICD-10-CM | POA: Diagnosis not present

## 2022-05-09 DIAGNOSIS — S02841D Fracture of lateral orbital wall, right side, subsequent encounter for fracture with routine healing: Secondary | ICD-10-CM | POA: Diagnosis not present

## 2022-05-09 DIAGNOSIS — M069 Rheumatoid arthritis, unspecified: Secondary | ICD-10-CM | POA: Diagnosis not present

## 2022-05-09 DIAGNOSIS — S0240ED Zygomatic fracture, right side, subsequent encounter for fracture with routine healing: Secondary | ICD-10-CM | POA: Diagnosis not present

## 2022-05-09 DIAGNOSIS — S0231XD Fracture of orbital floor, right side, subsequent encounter for fracture with routine healing: Secondary | ICD-10-CM | POA: Diagnosis not present

## 2022-05-09 NOTE — Telephone Encounter (Signed)
[name] [name] called back for VO for [facility] PT as written below. Verbal auth given.

## 2022-05-11 ENCOUNTER — Telehealth: Payer: Self-pay | Admitting: *Deleted

## 2022-05-11 NOTE — Telephone Encounter (Signed)
Call from [place] with [facility]. Stated she saw pt yesterday - requesting verbal orders for "OT once a week x 4 weeks for ADL's, therapeutic exercises, equipment recommendations, transfer to shower/toilet". VO given - if not appropriate, let me know. Thanks

## 2022-05-12 DIAGNOSIS — S0240ED Zygomatic fracture, right side, subsequent encounter for fracture with routine healing: Secondary | ICD-10-CM | POA: Diagnosis not present

## 2022-05-12 DIAGNOSIS — S0231XD Fracture of orbital floor, right side, subsequent encounter for fracture with routine healing: Secondary | ICD-10-CM | POA: Diagnosis not present

## 2022-05-12 DIAGNOSIS — S0240CD Maxillary fracture, right side, subsequent encounter for fracture with routine healing: Secondary | ICD-10-CM | POA: Diagnosis not present

## 2022-05-12 DIAGNOSIS — S02841D Fracture of lateral orbital wall, right side, subsequent encounter for fracture with routine healing: Secondary | ICD-10-CM | POA: Diagnosis not present

## 2022-05-12 DIAGNOSIS — M069 Rheumatoid arthritis, unspecified: Secondary | ICD-10-CM | POA: Diagnosis not present

## 2022-05-12 DIAGNOSIS — C159 Malignant neoplasm of esophagus, unspecified: Secondary | ICD-10-CM | POA: Diagnosis not present

## 2022-05-15 ENCOUNTER — Inpatient Hospital Stay: Payer: Medicare Other

## 2022-05-15 ENCOUNTER — Encounter: Payer: Self-pay | Admitting: *Deleted

## 2022-05-15 ENCOUNTER — Inpatient Hospital Stay: Payer: Medicare Other | Attending: Oncology

## 2022-05-15 ENCOUNTER — Telehealth: Payer: Self-pay | Admitting: *Deleted

## 2022-05-15 ENCOUNTER — Inpatient Hospital Stay (HOSPITAL_BASED_OUTPATIENT_CLINIC_OR_DEPARTMENT_OTHER): Payer: Medicare Other | Admitting: Oncology

## 2022-05-15 ENCOUNTER — Encounter: Payer: Self-pay | Admitting: Oncology

## 2022-05-15 VITALS — BP 142/84 | HR 98 | Temp 98.1°F | Resp 16 | Wt 141.7 lb

## 2022-05-15 DIAGNOSIS — D509 Iron deficiency anemia, unspecified: Secondary | ICD-10-CM | POA: Insufficient documentation

## 2022-05-15 DIAGNOSIS — C159 Malignant neoplasm of esophagus, unspecified: Secondary | ICD-10-CM

## 2022-05-15 DIAGNOSIS — R590 Localized enlarged lymph nodes: Secondary | ICD-10-CM | POA: Diagnosis not present

## 2022-05-15 DIAGNOSIS — C801 Malignant (primary) neoplasm, unspecified: Secondary | ICD-10-CM

## 2022-05-15 DIAGNOSIS — C787 Secondary malignant neoplasm of liver and intrahepatic bile duct: Secondary | ICD-10-CM | POA: Insufficient documentation

## 2022-05-15 DIAGNOSIS — Z5111 Encounter for antineoplastic chemotherapy: Secondary | ICD-10-CM | POA: Diagnosis not present

## 2022-05-15 DIAGNOSIS — Z79899 Other long term (current) drug therapy: Secondary | ICD-10-CM | POA: Diagnosis not present

## 2022-05-15 DIAGNOSIS — C155 Malignant neoplasm of lower third of esophagus: Secondary | ICD-10-CM

## 2022-05-15 DIAGNOSIS — J449 Chronic obstructive pulmonary disease, unspecified: Secondary | ICD-10-CM | POA: Insufficient documentation

## 2022-05-15 DIAGNOSIS — R160 Hepatomegaly, not elsewhere classified: Secondary | ICD-10-CM | POA: Insufficient documentation

## 2022-05-15 LAB — CBC WITH DIFFERENTIAL (CANCER CENTER ONLY)
Abs Immature Granulocytes: 0.01 10*3/uL (ref 0.00–0.07)
Basophils Absolute: 0.1 10*3/uL (ref 0.0–0.1)
Basophils Relative: 1 %
Eosinophils Absolute: 0.2 10*3/uL (ref 0.0–0.5)
Eosinophils Relative: 4 %
HCT: 33.5 % — ABNORMAL LOW (ref 39.0–52.0)
Hemoglobin: 9.9 g/dL — ABNORMAL LOW (ref 13.0–17.0)
Immature Granulocytes: 0 %
Lymphocytes Relative: 22 %
Lymphs Abs: 1.4 10*3/uL (ref 0.7–4.0)
MCH: 25.4 pg — ABNORMAL LOW (ref 26.0–34.0)
MCHC: 29.6 g/dL — ABNORMAL LOW (ref 30.0–36.0)
MCV: 86.1 fL (ref 80.0–100.0)
Monocytes Absolute: 0.4 10*3/uL (ref 0.1–1.0)
Monocytes Relative: 6 %
Neutro Abs: 4.4 10*3/uL (ref 1.7–7.7)
Neutrophils Relative %: 67 %
Platelet Count: 151 10*3/uL (ref 150–400)
RBC: 3.89 MIL/uL — ABNORMAL LOW (ref 4.22–5.81)
RDW: 20.7 % — ABNORMAL HIGH (ref 11.5–15.5)
WBC Count: 6.5 10*3/uL (ref 4.0–10.5)
nRBC: 0 % (ref 0.0–0.2)

## 2022-05-15 LAB — CMP (CANCER CENTER ONLY)
ALT: 24 U/L (ref 0–44)
AST: 46 U/L — ABNORMAL HIGH (ref 15–41)
Albumin: 3.2 g/dL — ABNORMAL LOW (ref 3.5–5.0)
Alkaline Phosphatase: 210 U/L — ABNORMAL HIGH (ref 38–126)
Anion gap: 7 (ref 5–15)
BUN: 10 mg/dL (ref 8–23)
CO2: 26 mmol/L (ref 22–32)
Calcium: 9.2 mg/dL (ref 8.9–10.3)
Chloride: 109 mmol/L (ref 98–111)
Creatinine: 0.78 mg/dL (ref 0.61–1.24)
GFR, Estimated: >60 mL/min (ref >60)
Glucose, Bld: 90 mg/dL (ref 70–99)
Potassium: 4 mmol/L (ref 3.5–5.1)
Sodium: 142 mmol/L (ref 135–145)
Total Bilirubin: 0.3 mg/dL (ref 0.3–1.2)
Total Protein: 6.8 g/dL (ref 6.5–8.1)

## 2022-05-15 LAB — MAGNESIUM: Magnesium: 1.6 mg/dL — ABNORMAL LOW (ref 1.7–2.4)

## 2022-05-15 MED ORDER — ACETAMINOPHEN 325 MG PO TABS
650.0000 mg | ORAL_TABLET | Freq: Once | ORAL | Status: AC
  Administered 2022-05-15: 650 mg via ORAL
  Filled 2022-05-15: qty 2

## 2022-05-15 MED ORDER — TRASTUZUMAB-ANNS CHEMO 150 MG IV SOLR
4.0000 mg/kg | Freq: Once | INTRAVENOUS | Status: AC
  Administered 2022-05-15: 252 mg via INTRAVENOUS
  Filled 2022-05-15: qty 12

## 2022-05-15 MED ORDER — DEXTROSE 5 % IV SOLN: Freq: Once | INTRAVENOUS | Status: AC

## 2022-05-15 MED ORDER — PALONOSETRON HCL INJECTION 0.25 MG/5ML
0.2500 mg | Freq: Once | INTRAVENOUS | Status: AC
  Administered 2022-05-15: 0.25 mg via INTRAVENOUS
  Filled 2022-05-15: qty 5

## 2022-05-15 MED ORDER — OXALIPLATIN CHEMO INJECTION 100 MG/20ML
85.0000 mg/m2 | Freq: Once | INTRAVENOUS | Status: AC
  Administered 2022-05-15: 150 mg via INTRAVENOUS
  Filled 2022-05-15: qty 10

## 2022-05-15 MED ORDER — SODIUM CHLORIDE 0.9 % IV SOLN
10.0000 mg | Freq: Once | INTRAVENOUS | Status: AC
  Administered 2022-05-15: 10 mg via INTRAVENOUS
  Filled 2022-05-15: qty 10

## 2022-05-15 MED ORDER — HEPARIN SOD (PORK) LOCK FLUSH 100 UNIT/ML IV SOLN
500.0000 [IU] | Freq: Once | INTRAVENOUS | Status: AC | PRN
  Administered 2022-05-15: 500 [IU]

## 2022-05-15 MED ORDER — SODIUM CHLORIDE 0.9% FLUSH
10.0000 mL | INTRAVENOUS | Status: DC | PRN
  Administered 2022-05-15: 10 mL

## 2022-05-15 MED ORDER — DIPHENHYDRAMINE HCL 25 MG PO CAPS
50.0000 mg | ORAL_CAPSULE | Freq: Once | ORAL | Status: AC
  Administered 2022-05-15: 50 mg via ORAL
  Filled 2022-05-15: qty 2

## 2022-05-15 MED ORDER — SODIUM CHLORIDE 0.9 % IV SOLN
200.0000 mg | Freq: Once | INTRAVENOUS | Status: AC
  Administered 2022-05-15: 200 mg via INTRAVENOUS
  Filled 2022-05-15: qty 8

## 2022-05-15 MED ORDER — SODIUM CHLORIDE 0.9 % IV SOLN: Freq: Once | INTRAVENOUS | Status: AC

## 2022-05-15 NOTE — Progress Notes (Signed)
[facility] OFFICE PROGRESS NOTE   Diagnosis: Metastatic adenocarcinoma, likely esophagus primary  INTERVAL HISTORY:   [name] [name] returns as scheduled.  He was discharged in the hospital [place] 03/15/2022 after admission with Klebsiella bacteremia.  He denies recurrent fever and chills.  Good appetite.  He reports numbness in the right hand since beginning chemotherapy.  This does not interfere with activity.  Objective:  Vital signs in last 24 hours:  Blood pressure (!) 136/99, pulse 98, temperature 98.1 F (36.7 C), temperature source Temporal, resp. rate 16, weight 177 lb (80.3 kg), SpO2 97 %.    HEENT: No thrush or ulcers Resp: Distant breath sounds, lungs clear bilaterally Cardio: Regular rate and rhythm GI: No hepatosplenomegaly Vascular: No leg edema    Portacath/PICC-without erythema  Lab Results:  Lab Results  Component Value Date   WBC 6.5 05/15/2022   HGB 9.9 (L) 05/15/2022   HCT 33.5 (L) 05/15/2022   MCV 86.1 05/15/2022   PLT 151 05/15/2022   NEUTROABS 4.4 05/15/2022    CMP  Lab Results  Component Value Date   NA 134 (L) 05/04/2022   K 3.6 05/04/2022   CL 102 05/04/2022   CO2 23 05/04/2022   GLUCOSE 89 05/04/2022   BUN 8 05/04/2022   CREATININE 0.78 05/04/2022   CALCIUM 8.1 (L) 05/04/2022   PROT 5.7 (L) 05/03/2022   ALBUMIN 2.0 (L) 05/03/2022   AST 25 05/03/2022   ALT 25 05/03/2022   ALKPHOS 228 (H) 05/03/2022   BILITOT 0.5 05/03/2022   GFRNONAA >60 05/04/2022   GFRAA >60 01/24/2020    Lab Results  Component Value Date   CEA1 287.0 (H) 02/11/2022   CEA 644.67 (H) 03/03/2022   TGG269 88 (H) 02/11/2022    Medications: I have reviewed the patient's current medications.   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 -02/11/2022 CTA chest-7.8 x 8.5 cm subtle hypoattenuating  mass lesion in segment IV of the liver. -02/11/2022 CT abdomen/pelvis-8.7 x 7.8 or ill-defined irregular mass in the medial segment left liver, 4.5 x 3.4 cm necrotic mass lesion just cranial to the esophagogastric junction with metastatic lymphadenopathy in the abdomen and pelvis, irregular nodular peritoneal thickening in the lower abdomen and pelvis -02/11/2022 MRI abdomen-rim-enhancing likely necrotic mass or lymph node conglomerate centered around the gastroesophageal junction measuring 5.0 x 3.9 cm, large rim-enhancing likely necrotic liver mass centered in the anterior left lobe of the liver measuring 7.8 x 7.2 cm, enlarged gastrohepatic ligament and retroperitoneal lymph nodes -Labs from 02/11/2022-AFP 2.4, CA 19.9 88, CEA 287 -Ultrasound-guided liver biopsy performed 02/15/2022-adenocarcinoma with extensive necrosis, CK7 positive, negative for CK20, CDX2, TTF-1, and PAX8, consistent with a pancreatic, lung, or upper gastrointestinal primary -PET 03/08/2022-hypermetabolic distal esophagus mass, hypermetabolic centrally necrotic hepatic mass, hypermetabolic periportal nodes, exophytic GE junction mass without hypermetabolism-likely benign lesion, no hypermetabolism in the pancreas head, bilateral hypermetabolic adrenal gland lesions, small hypermetabolic left parotid gland lesion.  No loss of mismatch repair protein expression, HER2 3+ -CTs 03/11/2022-no change from PET 03/08/2022, known distal esophagus mass-not visualized on unenhanced CT, large hepatic metastasis and upper abdominal nodal metastases, chronic pancreatitis with 4.3 cm pseudocyst in the pancreas head -Cycle 1 FOLFOX, trastuzumab, Pembrolizumab 04/06/2022 -Cycle 2 oxaliplatin, 5-FU bolus/pump and trastuzumab held due to hospitalization following cycle 1 with possible coronary vasospasm -Pembrolizumab 04/25/2022 -CT abdomen/pelvis 05/02/2022-increased peripancreatic edema, increased size of pancreas head pseudocyst, increased necrotic nodal  masses at the GE junction -Cycle 3 oxaliplatin, trastuzumab, pembrolizumab 05/15/2022; 5-FU held secondary to cardiac safety following cycle 1 2.  Myocardial infarction/cardiac arrest 01/31/2022 -Echocardiogram 02/01/2022-LVEF 60-65%, right ventricle severely dilated with hypokinesis in the basal and midportion, hypokinesis in the apical portion, right ventricular systolic function moderately reduced -Echocardiogram on 05/01/2022, LVEF 50-55%, decreased right ventricular systolic function, moderately enlarged right ventricle 3.  Microcytic anemia -Labs from 02/11/2022-ferritin 151, iron 13, percent saturation 5% 4.  DVT/pulmonary embolism-bilateral lower extremity DVTs 01/10/2022, pulmonary emboli on chest CT 01/31/2022-apixaban 5.  PAD status post AKA in 2021 6.  RA 7.  Bilateral pleural effusions -Ultrasound-guided thoracentesis performed on 02/11/2022.  Cytology negative for malignancy. 8.  COPD 9.  Admission 03/11/2022 with E. coli bacteremia Ultrasound-guided biopsy/aspiration of dominant necrotic appearing liver lesion 03/17/2022-scant fluid aspirated-negative culture, no organisms or white cells.  Pathology with necrotic tissue. 10.  Admission 04/29/2022 after a fall, multiple skull fractures 11.  Fever, hypotension-Klebsiella bacteremia on blood culture 04/30/2022     Disposition: [name] [name] appears well.  He has recovered from the recent hospital admission with Klebsiella bacteremia.  The source for bacteremia was not clear, but I suspect the bacteremia was related to the necrotic tumor at the esophagus and liver.  I have a low clinical suspicion for Port-A-Cath infection.  [name] [name] will complete another cycle of chemotherapy today with oxaliplatin, trastuzumab, and pembrolizumab.  5-FU will remain on hold due to the myocardial infarction he experienced following cycle 1.  [name] [name] will return for an office visit and chemotherapy in 2 weeks.  Betsy Coder, MD  05/15/2022  8:55 AM

## 2022-05-15 NOTE — Progress Notes (Signed)
Patient seen by Dr. Benay Spice today  Vitals are within treatment parameters.  Labs reviewed by Dr. Benay Spice and are not all within treatment parameters. Mg + = 1.6  Per physician team, patient is ready for treatment and there are NO modifications to the treatment plan. Does not need IV Mg+ at this time.

## 2022-05-15 NOTE — Patient Instructions (Signed)
[facility]   Discharge Instructions: Thank you for choosing [facility] to provide your oncology and hematology care.   If you have a lab appointment with the [facility], please go directly to the [facility] and check in at the registration area.   Wear comfortable clothing and clothing appropriate for easy access to any Portacath or PICC line.   We strive to give you quality time with your provider. You may need to reschedule your appointment if you arrive late (15 or more minutes).  Arriving late affects you and other patients whose appointments are after yours.  Also, if you miss three or more appointments without notifying the office, you may be dismissed from the clinic at the provider's discretion.      For prescription refill requests, have your pharmacy contact our office and allow 72 hours for refills to be completed.    Today you received the following chemotherapy and/or immunotherapy agents Trastuzumab-anns (KANJINTI), Pembrolizumab (KEYTRUDA) & Oxaliplatin (ELOXATIN).      To help prevent nausea and vomiting after your treatment, we encourage you to take your nausea medication as directed.  BELOW ARE SYMPTOMS THAT SHOULD BE REPORTED IMMEDIATELY: *FEVER GREATER THAN 100.4 F (38 C) OR HIGHER *CHILLS OR SWEATING *NAUSEA AND VOMITING THAT IS NOT CONTROLLED WITH YOUR NAUSEA MEDICATION *UNUSUAL SHORTNESS OF BREATH *UNUSUAL BRUISING OR BLEEDING *URINARY PROBLEMS (pain or burning when urinating, or frequent urination) *BOWEL PROBLEMS (unusual diarrhea, constipation, pain near the anus) TENDERNESS IN MOUTH AND THROAT WITH OR WITHOUT PRESENCE OF ULCERS (sore throat, sores in mouth, or a toothache) UNUSUAL RASH, SWELLING OR PAIN  UNUSUAL VAGINAL DISCHARGE OR ITCHING   Items with * indicate a potential emergency and should be followed up as soon as possible or go to the Emergency Department if any problems should occur.  Please show the  CHEMOTHERAPY ALERT CARD or IMMUNOTHERAPY ALERT CARD at check-in to the Emergency Department and triage nurse.  Should you have questions after your visit or need to cancel or reschedule your appointment, please contact [facility]  Dept: [phone]  and follow the prompts.  Office hours are 8:00 a.m. to 4:30 p.m. Monday - Friday. Please note that voicemails left after 4:00 p.m. may not be returned until the following business day.  We are closed weekends and major holidays. You have access to a nurse at all times for urgent questions. Please call the main number to the clinic Dept: [phone] and follow the prompts.   For any non-urgent questions, you may also contact your provider using MyChart. We now offer e-Visits for anyone 23 and older to request care online for non-urgent symptoms. For details visit [email or web address].   Also download the MyChart app! Go to the app store, search "MyChart", open the app, select [facility], and log in with your MyChart username and password.  Masks are optional in the cancer centers. If you would like for your care team to wear a mask while they are taking care of you, please let them know. You may have one support person who is at least 62 years old accompany you for your appointments.  Trastuzumab Injection What is this medication? TRASTUZUMAB (tras TOO zoo mab) treats breast cancer and stomach cancer. It works by blocking a protein that causes cancer cells to grow and multiply. This helps to slow or stop the spread of cancer cells. This medicine may be used for other purposes; ask your health care  provider or pharmacist if you have questions. COMMON BRAND NAME(S): Herceptin, Janae Bridgeman, Ontruzant, Trazimera What should I tell my care team before I take this medication? They need to know if you have any of these conditions: Heart failure Lung disease An unusual or allergic reaction to trastuzumab, other  medications, foods, dyes, or preservatives Pregnant or trying to get pregnant Breast-feeding How should I use this medication? This medication is injected into a vein. It is given by your care team in a hospital or clinic setting. Talk to your care team about the use of this medication in children. It is not approved for use in children. Overdosage: If you think you have taken too much of this medicine contact a poison control center or emergency room at once. NOTE: This medicine is only for you. Do not share this medicine with others. What if I miss a dose? Keep appointments for follow-up doses. It is important not to miss your dose. Call your care team if you are unable to keep an appointment. What may interact with this medication? Certain types of chemotherapy, such as daunorubicin, doxorubicin, epirubicin, idarubicin This list may not describe all possible interactions. Give your health care provider a list of all the medicines, herbs, non-prescription drugs, or dietary supplements you use. Also tell them if you smoke, drink alcohol, or use illegal drugs. Some items may interact with your medicine. What should I watch for while using this medication? Your condition will be monitored carefully while you are receiving this medication. This medication may make you feel generally unwell. This is not uncommon, as chemotherapy affects healthy cells as well as cancer cells. Report any side effects. Continue your course of treatment even though you feel ill unless your care team tells you to stop. This medication may increase your risk of getting an infection. Call your care team for advice if you get a fever, chills, sore throat, or other symptoms of a cold or flu. Do not treat yourself. Try to avoid being around people who are sick. Avoid taking medications that contain aspirin, acetaminophen, ibuprofen, naproxen, or ketoprofen unless instructed by your care team. These medications can hide a  fever. Talk to your care team if you may be pregnant. Serious birth defects can occur if you take this medication during pregnancy and for 7 months after the last dose. You will need a negative pregnancy test before starting this medication. Contraception is recommended while taking this medication and for 7 months after the last dose. Your care team can help you find the option that works for you. Do not breastfeed while taking this medication and for 7 months after stopping treatment. What side effects may I notice from receiving this medication? Side effects that you should report to your care team as soon as possible: Allergic reactions or angioedema--skin rash, itching or hives, swelling of the face, eyes, lips, tongue, arms, or legs, trouble swallowing or breathing Dry cough, shortness of breath or trouble breathing Heart failure--shortness of breath, swelling of the ankles, feet, or hands, sudden weight gain, unusual weakness or fatigue Infection--fever, chills, cough, or sore throat Infusion reactions--chest pain, shortness of breath or trouble breathing, feeling faint or lightheaded Side effects that usually do not require medical attention (report to your care team if they continue or are bothersome): Diarrhea Dizziness Headache Nausea Trouble sleeping Vomiting This list may not describe all possible side effects. Call your doctor for medical advice about side effects. You may report side effects to FDA  at 1-800-FDA-1088. Where should I keep my medication? This medication is given in a hospital or clinic. It will not be stored at home. NOTE: This sheet is a summary. It may not cover all possible information. If you have questions about this medicine, talk to your doctor, pharmacist, or health care provider.  2023 Elsevier/Gold Standard (2021-10-13 00:00:00)  Pembrolizumab Injection What is this medication? PEMBROLIZUMAB (PEM broe LIZ ue mab) treats some types of cancer. It works  by helping your immune system slow or stop the spread of cancer cells. It is a monoclonal antibody. This medicine may be used for other purposes; ask your health care provider or pharmacist if you have questions. COMMON BRAND NAME(S): Keytruda What should I tell my care team before I take this medication? They need to know if you have any of these conditions: Allogeneic stem cell transplant (uses someone else's stem cells) Autoimmune diseases, such as Crohn disease, ulcerative colitis, lupus History of chest radiation Nervous system problems, such as Guillain-Barre syndrome, myasthenia gravis Organ transplant An unusual or allergic reaction to pembrolizumab, other medications, foods, dyes, or preservatives Pregnant or trying to get pregnant Breast-feeding How should I use this medication? This medication is injected into a vein. It is given by your care team in a hospital or clinic setting. A special MedGuide will be given to you before each treatment. Be sure to read this information carefully each time. Talk to your care team about the use of this medication in children. While it may be prescribed for children as young as 6 months for selected conditions, precautions do apply. Overdosage: If you think you have taken too much of this medicine contact a poison control center or emergency room at once. NOTE: This medicine is only for you. Do not share this medicine with others. What if I miss a dose? Keep appointments for follow-up doses. It is important not to miss your dose. Call your care team if you are unable to keep an appointment. What may interact with this medication? Interactions have not been studied. This list may not describe all possible interactions. Give your health care provider a list of all the medicines, herbs, non-prescription drugs, or dietary supplements you use. Also tell them if you smoke, drink alcohol, or use illegal drugs. Some items may interact with your  medicine. What should I watch for while using this medication? Your condition will be monitored carefully while you are receiving this medication. You may need blood work while taking this medication. This medication may cause serious skin reactions. They can happen weeks to months after starting the medication. Contact your care team right away if you notice fevers or flu-like symptoms with a rash. The rash may be red or purple and then turn into blisters or peeling of the skin. You may also notice a red rash with swelling of the face, lips, or lymph nodes in your neck or under your arms. Tell your care team right away if you have any change in your eyesight. Talk to your care team if you may be pregnant. Serious birth defects can occur if you take this medication during pregnancy and for 4 months after the last dose. You will need a negative pregnancy test before starting this medication. Contraception is recommended while taking this medication and for 4 months after the last dose. Your care team can help you find the option that works for you. Do not breastfeed while taking this medication and for 4 months after the last dose.  What side effects may I notice from receiving this medication? Side effects that you should report to your care team as soon as possible: Allergic reactions--skin rash, itching, hives, swelling of the face, lips, tongue, or throat Dry cough, shortness of breath or trouble breathing Eye pain, redness, irritation, or discharge with blurry or decreased vision Heart muscle inflammation--unusual weakness or fatigue, shortness of breath, chest pain, fast or irregular heartbeat, dizziness, swelling of the ankles, feet, or hands Hormone gland problems--headache, sensitivity to light, unusual weakness or fatigue, dizziness, fast or irregular heartbeat, increased sensitivity to cold or heat, excessive sweating, constipation, hair loss, increased thirst or amount of urine, tremors or  shaking, irritability Infusion reactions--chest pain, shortness of breath or trouble breathing, feeling faint or lightheaded Kidney injury (glomerulonephritis)--decrease in the amount of urine, red or dark brown urine, foamy or bubbly urine, swelling of the ankles, hands, or feet Liver injury--right upper belly pain, loss of appetite, nausea, light-colored stool, dark yellow or brown urine, yellowing skin or eyes, unusual weakness or fatigue Pain, tingling, or numbness in the hands or feet, muscle weakness, change in vision, confusion or trouble speaking, loss of balance or coordination, trouble walking, seizures Rash, fever, and swollen lymph nodes Redness, blistering, peeling, or loosening of the skin, including inside the mouth Sudden or severe stomach pain, bloody diarrhea, fever, nausea, vomiting Side effects that usually do not require medical attention (report to your care team if they continue or are bothersome): Bone, joint, or muscle pain Diarrhea Fatigue Loss of appetite Nausea Skin rash This list may not describe all possible side effects. Call your doctor for medical advice about side effects. You may report side effects to FDA at 1-800-FDA-1088. Where should I keep my medication? This medication is given in a hospital or clinic. It will not be stored at home. NOTE: This sheet is a summary. It may not cover all possible information. If you have questions about this medicine, talk to your doctor, pharmacist, or health care provider.  2023 Elsevier/Gold Standard (2013-03-03 00:00:00)  Oxaliplatin Injection What is this medication? OXALIPLATIN (ox AL i PLA tin) treats some types of cancer. It works by slowing down the growth of cancer cells. This medicine may be used for other purposes; ask your health care provider or pharmacist if you have questions. COMMON BRAND NAME(S): Eloxatin What should I tell my care team before I take this medication? They need to know if you have any  of these conditions: Heart disease History of irregular heartbeat or rhythm Liver disease Low blood cell levels (white cells, red cells, and platelets) Lung or breathing disease, such as asthma Take medications that treat or prevent blood clots Tingling of the fingers, toes, or other nerve disorder An unusual or allergic reaction to oxaliplatin, other medications, foods, dyes, or preservatives If you or your partner are pregnant or trying to get pregnant Breast-feeding How should I use this medication? This medication is injected into a vein. It is given by your care team in a hospital or clinic setting. Talk to your care team about the use of this medication in children. Special care may be needed. Overdosage: If you think you have taken too much of this medicine contact a poison control center or emergency room at once. NOTE: This medicine is only for you. Do not share this medicine with others. What if I miss a dose? Keep appointments for follow-up doses. It is important not to miss a dose. Call your care team if you are unable  to keep an appointment. What may interact with this medication? Do not take this medication with any of the following: Cisapride Dronedarone Pimozide Thioridazine This medication may also interact with the following: Aspirin and aspirin-like medications Certain medications that treat or prevent blood clots, such as warfarin, apixaban, dabigatran, and rivaroxaban Cisplatin Cyclosporine Diuretics Medications for infection, such as acyclovir, adefovir, amphotericin B, bacitracin, cidofovir, foscarnet, ganciclovir, gentamicin, pentamidine, vancomycin NSAIDs, medications for pain and inflammation, such as ibuprofen or naproxen Other medications that cause heart rhythm changes Pamidronate Zoledronic acid This list may not describe all possible interactions. Give your health care provider a list of all the medicines, herbs, non-prescription drugs, or dietary  supplements you use. Also tell them if you smoke, drink alcohol, or use illegal drugs. Some items may interact with your medicine. What should I watch for while using this medication? Your condition will be monitored carefully while you are receiving this medication. You may need blood work while taking this medication. This medication may make you feel generally unwell. This is not uncommon as chemotherapy can affect healthy cells as well as cancer cells. Report any side effects. Continue your course of treatment even though you feel ill unless your care team tells you to stop. This medication may increase your risk of getting an infection. Call your care team for advice if you get a fever, chills, sore throat, or other symptoms of a cold or flu. Do not treat yourself. Try to avoid being around people who are sick. Avoid taking medications that contain aspirin, acetaminophen, ibuprofen, naproxen, or ketoprofen unless instructed by your care team. These medications may hide a fever. Be careful brushing or flossing your teeth or using a toothpick because you may get an infection or bleed more easily. If you have any dental work done, tell your dentist you are receiving this medication. This medication can make you more sensitive to cold. Do not drink cold drinks or use ice. Cover exposed skin before coming in contact with cold temperatures or cold objects. When out in cold weather wear warm clothing and cover your mouth and nose to warm the air that goes into your lungs. Tell your care team if you get sensitive to the cold. Talk to your care team if you or your partner are pregnant or think either of you might be pregnant. This medication can cause serious birth defects if taken during pregnancy and for 9 months after the last dose. A negative pregnancy test is required before starting this medication. A reliable form of contraception is recommended while taking this medication and for 9 months after the  last dose. Talk to your care team about effective forms of contraception. Do not father a child while taking this medication and for 6 months after the last dose. Use a condom while having sex during this time period. Do not breastfeed while taking this medication and for 3 months after the last dose. This medication may cause infertility. Talk to your care team if you are concerned about your fertility. What side effects may I notice from receiving this medication? Side effects that you should report to your care team as soon as possible: Allergic reactions--skin rash, itching, hives, swelling of the face, lips, tongue, or throat Bleeding--bloody or black, tar-like stools, vomiting blood or brown material that looks like coffee grounds, red or dark brown urine, small red or purple spots on skin, unusual bruising or bleeding Dry cough, shortness of breath or trouble breathing Heart rhythm changes--fast or irregular  heartbeat, dizziness, feeling faint or lightheaded, chest pain, trouble breathing Infection--fever, chills, cough, sore throat, wounds that don't heal, pain or trouble when passing urine, general feeling of discomfort or being unwell Liver injury--right upper belly pain, loss of appetite, nausea, light-colored stool, dark yellow or brown urine, yellowing skin or eyes, unusual weakness or fatigue Low red blood cell level--unusual weakness or fatigue, dizziness, headache, trouble breathing Muscle injury--unusual weakness or fatigue, muscle pain, dark yellow or brown urine, decrease in amount of urine Pain, tingling, or numbness in the hands or feet Sudden and severe headache, confusion, change in vision, seizures, which may be signs of posterior reversible encephalopathy syndrome (PRES) Unusual bruising or bleeding Side effects that usually do not require medical attention (report to your care team if they continue or are bothersome): Diarrhea Nausea Pain, redness, or swelling with sores  inside the mouth or throat Unusual weakness or fatigue Vomiting This list may not describe all possible side effects. Call your doctor for medical advice about side effects. You may report side effects to FDA at 1-800-FDA-1088. Where should I keep my medication? This medication is given in a hospital or clinic. It will not be stored at home. NOTE: This sheet is a summary. It may not cover all possible information. If you have questions about this medicine, talk to your doctor, pharmacist, or health care provider.  2023 Elsevier/Gold Standard (2007-08-03 00:00:00)

## 2022-05-15 NOTE — Telephone Encounter (Signed)
Received call from [place] with [facility]. Requesting VO to move Skilled Nursing Eval to today. Verbal auth given.

## 2022-05-16 ENCOUNTER — Other Ambulatory Visit: Payer: Self-pay

## 2022-05-16 ENCOUNTER — Telehealth: Payer: Self-pay | Admitting: *Deleted

## 2022-05-16 ENCOUNTER — Telehealth: Payer: Self-pay

## 2022-05-16 ENCOUNTER — Ambulatory Visit (INDEPENDENT_AMBULATORY_CARE_PROVIDER_SITE_OTHER): Payer: Medicare Other | Admitting: Student

## 2022-05-16 VITALS — BP 157/104 | HR 86 | Temp 97.8°F | Ht 67.0 in | Wt 130.0 lb

## 2022-05-16 DIAGNOSIS — Z87891 Personal history of nicotine dependence: Secondary | ICD-10-CM

## 2022-05-16 DIAGNOSIS — S02841D Fracture of lateral orbital wall, right side, subsequent encounter for fracture with routine healing: Secondary | ICD-10-CM | POA: Diagnosis not present

## 2022-05-16 DIAGNOSIS — I739 Peripheral vascular disease, unspecified: Secondary | ICD-10-CM

## 2022-05-16 DIAGNOSIS — M0579 Rheumatoid arthritis with rheumatoid factor of multiple sites without organ or systems involvement: Secondary | ICD-10-CM

## 2022-05-16 DIAGNOSIS — S0231XD Fracture of orbital floor, right side, subsequent encounter for fracture with routine healing: Secondary | ICD-10-CM | POA: Diagnosis not present

## 2022-05-16 DIAGNOSIS — S0240ED Zygomatic fracture, right side, subsequent encounter for fracture with routine healing: Secondary | ICD-10-CM | POA: Diagnosis not present

## 2022-05-16 DIAGNOSIS — C159 Malignant neoplasm of esophagus, unspecified: Secondary | ICD-10-CM | POA: Diagnosis not present

## 2022-05-16 DIAGNOSIS — I10 Essential (primary) hypertension: Secondary | ICD-10-CM

## 2022-05-16 DIAGNOSIS — I2699 Other pulmonary embolism without acute cor pulmonale: Secondary | ICD-10-CM

## 2022-05-16 DIAGNOSIS — M069 Rheumatoid arthritis, unspecified: Secondary | ICD-10-CM | POA: Diagnosis not present

## 2022-05-16 DIAGNOSIS — S0240CD Maxillary fracture, right side, subsequent encounter for fracture with routine healing: Secondary | ICD-10-CM | POA: Diagnosis not present

## 2022-05-16 MED ORDER — CHLORTHALIDONE 25 MG PO TABS: 25.0000 mg | ORAL_TABLET | Freq: Every day | ORAL | 11 refills | Status: DC

## 2022-05-16 MED ORDER — APIXABAN 5 MG PO TABS: 5.0000 mg | ORAL_TABLET | Freq: Two times a day (BID) | ORAL | 11 refills | Status: DC

## 2022-05-16 MED ORDER — MELOXICAM 7.5 MG PO TABS: 7.5000 mg | ORAL_TABLET | Freq: Every day | ORAL | 0 refills | Status: DC | PRN

## 2022-05-16 MED ORDER — CLOPIDOGREL BISULFATE 75 MG PO TABS: 75.0000 mg | ORAL_TABLET | Freq: Every day | ORAL | 11 refills | Status: DC

## 2022-05-16 MED ORDER — METHOTREXATE SODIUM 7.5 MG PO TABS: 7.5000 mg | ORAL_TABLET | ORAL | 3 refills | Status: DC

## 2022-05-16 MED ORDER — AMLODIPINE BESYLATE 10 MG PO TABS: 10.0000 mg | ORAL_TABLET | Freq: Every day | ORAL | 11 refills | Status: DC

## 2022-05-16 MED ORDER — FOLIC ACID 1 MG PO TABS: 1.0000 mg | ORAL_TABLET | Freq: Every day | ORAL | 3 refills | Status: DC

## 2022-05-16 NOTE — Patient Outreach (Signed)
  Care Coordination   In Person Provider Office Visit Note   05/16/2022 Name: SARVESH MEDDAUGH MRN: [id number] DOB: 1961-01-07  WAI LITT is a 61 y.o. year old male who sees Angelique Blonder, DO for primary care. I engaged with [name] in the providers office today.  What matters to the patients health and wellness today?  "I have sufficient help at home and they take good care of me".    Goals Addressed               This Visit's Progress     COMPLETED: "I have enough help at home" (pt-stated)        Care Coordination Interventions: Evaluation of current treatment plan related to medication, help at home, nutrition  and patient's adherence to plan as established by provider Discussed plans with patient for ongoing care management follow up and provided patient with direct contact information for care management team Assessed social determinant of health barriers Provided patient nutritional shakes and coupons.           SDOH assessments and interventions completed:  Yes  SDOH Interventions Today    Flowsheet Row Most Recent Value  SDOH Interventions   Housing Interventions Intervention Not Indicated  Utilities Interventions Intervention Not Indicated        Care Coordination Interventions Activated:  Yes  Care Coordination Interventions:  Yes, provided   Follow up plan:  Patient provided with RNCM contact information and he or his caregivers will notify this writer if assistance needed.    Encounter Outcome:  Pt. Visit Completed

## 2022-05-16 NOTE — Progress Notes (Signed)
   CC: Hypertension  HPI:  [name] is a 61 y.o. male with history as below who presents with high blood pressures at home.  Please see encounters tab for problem based charting.  Past Medical History:  Diagnosis Date   Abnormal CT scan, gastrointestinal tract    Acute respiratory failure ([place]) 01/31/2022   Acute respiratory failure with hypoxemia ([facility]) 02/11/2022   Anemia    Anemia 08/06/2012   Aortic valve mass 12/30/2015   Bilateral shoulder pain 08/20/2012   Bite wound of left hand 09/27/2021   Cardiogenic shock ([place]) 01/31/2022   Community acquired pneumonia 11/07/2011   Critical limb ischemia with history of revascularization of same extremity ([place]) 04/28/2020   DDD (degenerative disc disease), lumbosacral     and grade 2 slip   DVT (deep venous thrombosis) ([place]) 01/31/2022   GERD (gastroesophageal reflux disease)    H/O: stroke 01/31/2022   Heart murmur    History of anemia    no current med.   History of COVID-19 05/10/2019   History of critical lower limb ischemia 02/19/2019   Hyperglycemia 01/31/2022   Hypertension    states is borderline on medication; has been on med. x 5-6 yr.   Hypokalemia 05/30/2018   Impingement syndrome of shoulder region 10/2014   left   Insomnia 06/01/2015   Ischemic ulcer of toe of left foot ([facility])    Lactic acidosis 01/31/2022   Left shoulder pain 12/16/2015   Liver mass    Low back pain without sciatica 10/29/2009   Lupus ([place])    Multiple fractures of ribs, bilateral, due to CPR trauma 01/31/2022   Onychomycosis 01/28/2021   Paronychia of great toe 01/28/2021   Peripheral vascular disease ([place])    Pneumonia    Pressure injury of skin 01/31/2022   Pulmonary contusion 01/31/2022   RA (rheumatoid arthritis) ([place])    Rotator cuff tear 11/04/2014   STEMI (ST elevation myocardial infarction) ([place]) 01/31/2022   Sternal fracture 01/31/2022   Stroke ([place])    Transaminitis 12/16/2015   Review of Systems:   A comprehensive  review of systems was negative except for: Intermittent dizziness, intermittent dyspnea, joint pain   Physical Exam:  Vitals:   05/16/22 1416  Weight: 130 lb (59 kg)   Constitutional: Elderly male sitting in wheelchair. In no acute distress. HENT: Normocephalic, nearly resolved ecchymosis lateral to the right orbit otherwise atraumatic Eyes: Sclera non-icteric, EOM intact Cardio:Regular rate and rhythm. No murmurs, rubs, or gallops. 2+ bilateral radial pulses. Pulm:Clear to auscultation bilaterally. Normal work of breathing on room air. Abdomen: Soft, non-tender, non-distended, positive bowel sounds. FTD:DUKGURKY for extremity edema.  Significant bilateral ulnar deviation of the MCPs with tenderness to palpation of these joints. Skin:Warm and dry. Neuro:Alert and oriented x3.  Psych:Pleasant mood and affect.   Assessment & Plan:   See Encounters Tab for problem based charting.  Patient discussed with Dr. Philipp Ovens

## 2022-05-16 NOTE — Telephone Encounter (Signed)
Received call from [place], [place] with [facility] reporting BP at rest is 172/102. Patient states he took Imdur and metoprolol at 0800 this AM. Just took amlodipine 5 mg. This is not on his current med list. Patient c/o feeling "slightly dizzy " which he states is his usual. Also, states he had "slight SHOB" 45 minutes ago while sitting in his w/c in his sunroom with the door open for fresh air. Patient was given appt today at 2:15. He is asked to bring all his meds with him.

## 2022-05-16 NOTE — Patient Instructions (Addendum)
Thank you, [name] [name], for allowing [place] to provide your care today. Today we discussed . . .  > Hypertension       -We discussed the recent changes in her medication and we will revert back to your prior therapies.  You will continue to take your metoprolol and Imdur.  We will add back amlodipine 10 mg daily and chlorthalidone 25 mg daily.  Please continue to take your blood pressure and write it down to bring to your next appointment.  If you have any low blood pressures please not hesitate to call our office. > Rheumatoid arthritis       -As discussed we will start you on methotrexate 7.5 mg weekly and meloxicam 7.5 mg daily as needed.  We will give you a 15-day supply for the meloxicam.  I recommend that if your pain is improving you stop taking the meloxicam and continue with the methotrexate.  Please watch for any signs of bleeding especially dark tarry stools or blood in your stool.  We have also sent a referral for you to get seen by a rheumatologist. > Eliquis and Plavix       -We held your Eliquis and Plavix after your hospitalization due to the bleeding you had after your fall.  Your blood counts that were checked yesterday look good so we will restart these medications and you will take Eliquis 5 mg twice daily and Plavix 75 mg daily.  If you notice any bleeding please do not hesitate to call our office.   I have ordered the following labs for you:  Lab Orders  No laboratory test(s) ordered today      Tests ordered today:  None   Referrals ordered today:   Referral Orders         Ambulatory referral to Rheumatology       I have ordered the following medication/changed the following medications:   Stop the following medications: There are no discontinued medications.   Start the following medications: Meds ordered this encounter  Medications   amLODipine (NORVASC) 10 MG tablet    Sig: Take 1 tablet (10 mg total) by mouth daily.    Dispense:  30 tablet     Refill:  11   chlorthalidone (HYGROTON) 25 MG tablet    Sig: Take 1 tablet (25 mg total) by mouth daily.    Dispense:  30 tablet    Refill:  11   apixaban (ELIQUIS) 5 MG TABS tablet    Sig: Take 1 tablet (5 mg total) by mouth 2 (two) times daily.    Dispense:  60 tablet    Refill:  11   clopidogrel (PLAVIX) 75 MG tablet    Sig: Take 1 tablet (75 mg total) by mouth daily.    Dispense:  30 tablet    Refill:  11   methotrexate (RHEUMATREX) 7.5 MG tablet    Sig: Take 1 tablet (7.5 mg total) by mouth once a week. Caution:Chemotherapy. Protect from light.    Dispense:  4 tablet    Refill:  3   folic acid (FOLVITE) 1 MG tablet    Sig: Take 1 tablet (1 mg total) by mouth daily.    Dispense:  90 tablet    Refill:  3   meloxicam (MOBIC) 7.5 MG tablet    Sig: Take 1 tablet (7.5 mg total) by mouth daily as needed for pain.    Dispense:  15 tablet    Refill:  0  Follow up: 1 Month    Remember:     Should you have any questions or concerns please call the internal medicine clinic at [phone].     [name] [name], [place]

## 2022-05-17 ENCOUNTER — Other Ambulatory Visit: Payer: Self-pay

## 2022-05-17 ENCOUNTER — Inpatient Hospital Stay: Payer: Medicare Other | Admitting: Nutrition

## 2022-05-17 ENCOUNTER — Inpatient Hospital Stay: Payer: Medicare Other

## 2022-05-19 NOTE — Assessment & Plan Note (Addendum)
Patient has a long history of rheumatoid arthritis and has been on methotrexate and other DMARDs in the past.  Unfortunately he has had issues with follow-up with rheumatology, noting that one of his rheumatologist passed away.  Records indicate that he was dismissed from the clinic due to no-shows.  He has not been on any therapy for the past 4 to 6 months and has noted worsening of his function.  On exam he has significant ulnar deviation of his MCPs.  He also has tenderness to palpation over his MCPs.  He would like to see rheumatology again and notes that methotrexate had worked for him in the past. - Start methotrexate 7.5 mg weekly and meloxicam 7.5 mg daily as needed - Folic acid supplementation - Referral to rheumatology

## 2022-05-19 NOTE — Assessment & Plan Note (Signed)
Patient presents to the clinic after being sent by home health PT for high blood pressure stated to be 172/102 at rest.  Blood pressure during our visit is 157/104.  He has recently had a couple hospitalizations and changes in his blood pressure medications.  Currently he is prescribed metoprolol 25 mg daily and Imdur 30 mg daily.  He had previously been on amlodipine 10 mg daily and chlorthalidone 25 mg daily.  He did take one of the amlodipine that he still had this morning.  He noted some slight dizziness when his blood pressure was high but has not had any low blood pressures. - Continue metoprolol 25 mg daily and Imdur 30 mg daily - Restart amlodipine 10 mg daily and chlorthalidone 25 mg daily - Return in 1 month with blood pressure log and medications for reevaluation

## 2022-05-22 DIAGNOSIS — S0240CD Maxillary fracture, right side, subsequent encounter for fracture with routine healing: Secondary | ICD-10-CM | POA: Diagnosis not present

## 2022-05-22 DIAGNOSIS — S0240ED Zygomatic fracture, right side, subsequent encounter for fracture with routine healing: Secondary | ICD-10-CM | POA: Diagnosis not present

## 2022-05-22 DIAGNOSIS — C159 Malignant neoplasm of esophagus, unspecified: Secondary | ICD-10-CM | POA: Diagnosis not present

## 2022-05-22 DIAGNOSIS — M069 Rheumatoid arthritis, unspecified: Secondary | ICD-10-CM | POA: Diagnosis not present

## 2022-05-22 DIAGNOSIS — S0231XD Fracture of orbital floor, right side, subsequent encounter for fracture with routine healing: Secondary | ICD-10-CM | POA: Diagnosis not present

## 2022-05-22 DIAGNOSIS — S02841D Fracture of lateral orbital wall, right side, subsequent encounter for fracture with routine healing: Secondary | ICD-10-CM | POA: Diagnosis not present

## 2022-05-23 DIAGNOSIS — S0240CD Maxillary fracture, right side, subsequent encounter for fracture with routine healing: Secondary | ICD-10-CM | POA: Diagnosis not present

## 2022-05-23 DIAGNOSIS — S0231XD Fracture of orbital floor, right side, subsequent encounter for fracture with routine healing: Secondary | ICD-10-CM | POA: Diagnosis not present

## 2022-05-23 DIAGNOSIS — S0240ED Zygomatic fracture, right side, subsequent encounter for fracture with routine healing: Secondary | ICD-10-CM | POA: Diagnosis not present

## 2022-05-23 DIAGNOSIS — M069 Rheumatoid arthritis, unspecified: Secondary | ICD-10-CM | POA: Diagnosis not present

## 2022-05-23 DIAGNOSIS — S02841D Fracture of lateral orbital wall, right side, subsequent encounter for fracture with routine healing: Secondary | ICD-10-CM | POA: Diagnosis not present

## 2022-05-23 DIAGNOSIS — C159 Malignant neoplasm of esophagus, unspecified: Secondary | ICD-10-CM | POA: Diagnosis not present

## 2022-05-23 NOTE — Progress Notes (Signed)
Internal Medicine Clinic Attending  Case discussed with Dr. Nikki Dom  At the time of the visit.  We reviewed the resident's history and exam and pertinent patient test results.  I agree with the assessment, diagnosis, and plan of care documented in the resident's note.

## 2022-05-24 DIAGNOSIS — S02841D Fracture of lateral orbital wall, right side, subsequent encounter for fracture with routine healing: Secondary | ICD-10-CM | POA: Diagnosis not present

## 2022-05-24 DIAGNOSIS — S0240CD Maxillary fracture, right side, subsequent encounter for fracture with routine healing: Secondary | ICD-10-CM | POA: Diagnosis not present

## 2022-05-24 DIAGNOSIS — S0231XD Fracture of orbital floor, right side, subsequent encounter for fracture with routine healing: Secondary | ICD-10-CM | POA: Diagnosis not present

## 2022-05-24 DIAGNOSIS — C159 Malignant neoplasm of esophagus, unspecified: Secondary | ICD-10-CM | POA: Diagnosis not present

## 2022-05-24 DIAGNOSIS — S0240ED Zygomatic fracture, right side, subsequent encounter for fracture with routine healing: Secondary | ICD-10-CM | POA: Diagnosis not present

## 2022-05-24 DIAGNOSIS — M069 Rheumatoid arthritis, unspecified: Secondary | ICD-10-CM | POA: Diagnosis not present

## 2022-05-25 ENCOUNTER — Other Ambulatory Visit: Payer: Self-pay | Admitting: Student

## 2022-05-25 DIAGNOSIS — M069 Rheumatoid arthritis, unspecified: Secondary | ICD-10-CM | POA: Diagnosis not present

## 2022-05-25 DIAGNOSIS — E785 Hyperlipidemia, unspecified: Secondary | ICD-10-CM

## 2022-05-25 DIAGNOSIS — S0240ED Zygomatic fracture, right side, subsequent encounter for fracture with routine healing: Secondary | ICD-10-CM | POA: Diagnosis not present

## 2022-05-25 DIAGNOSIS — S0231XD Fracture of orbital floor, right side, subsequent encounter for fracture with routine healing: Secondary | ICD-10-CM | POA: Diagnosis not present

## 2022-05-25 DIAGNOSIS — S02841D Fracture of lateral orbital wall, right side, subsequent encounter for fracture with routine healing: Secondary | ICD-10-CM | POA: Diagnosis not present

## 2022-05-25 DIAGNOSIS — S0240CD Maxillary fracture, right side, subsequent encounter for fracture with routine healing: Secondary | ICD-10-CM | POA: Diagnosis not present

## 2022-05-25 DIAGNOSIS — C159 Malignant neoplasm of esophagus, unspecified: Secondary | ICD-10-CM | POA: Diagnosis not present

## 2022-05-25 MED ORDER — ATORVASTATIN CALCIUM 40 MG PO TABS: 40.0000 mg | ORAL_TABLET | Freq: Every day | ORAL | 0 refills | Status: DC

## 2022-05-25 NOTE — Telephone Encounter (Signed)
Already sent on 05/25/2022.

## 2022-05-28 ENCOUNTER — Other Ambulatory Visit: Payer: Self-pay | Admitting: Oncology

## 2022-05-29 ENCOUNTER — Encounter: Payer: Self-pay | Admitting: *Deleted

## 2022-05-29 ENCOUNTER — Other Ambulatory Visit: Payer: Medicare Other

## 2022-05-29 ENCOUNTER — Inpatient Hospital Stay: Payer: Medicare Other | Attending: Oncology

## 2022-05-29 ENCOUNTER — Other Ambulatory Visit: Payer: Self-pay | Admitting: Oncology

## 2022-05-29 ENCOUNTER — Inpatient Hospital Stay: Payer: Medicare Other

## 2022-05-29 ENCOUNTER — Inpatient Hospital Stay (HOSPITAL_BASED_OUTPATIENT_CLINIC_OR_DEPARTMENT_OTHER): Payer: Medicare Other | Admitting: Oncology

## 2022-05-29 VITALS — BP 98/60 | HR 88 | Resp 20

## 2022-05-29 VITALS — BP 96/78 | HR 66 | Temp 98.2°F | Resp 16 | Wt 142.9 lb

## 2022-05-29 DIAGNOSIS — Z79899 Other long term (current) drug therapy: Secondary | ICD-10-CM | POA: Diagnosis not present

## 2022-05-29 DIAGNOSIS — Z23 Encounter for immunization: Secondary | ICD-10-CM | POA: Diagnosis not present

## 2022-05-29 DIAGNOSIS — R970 Elevated carcinoembryonic antigen [CEA]: Secondary | ICD-10-CM | POA: Insufficient documentation

## 2022-05-29 DIAGNOSIS — Z7901 Long term (current) use of anticoagulants: Secondary | ICD-10-CM | POA: Diagnosis not present

## 2022-05-29 DIAGNOSIS — C801 Malignant (primary) neoplasm, unspecified: Secondary | ICD-10-CM

## 2022-05-29 DIAGNOSIS — Z86718 Personal history of other venous thrombosis and embolism: Secondary | ICD-10-CM | POA: Diagnosis not present

## 2022-05-29 DIAGNOSIS — Z86711 Personal history of pulmonary embolism: Secondary | ICD-10-CM | POA: Insufficient documentation

## 2022-05-29 DIAGNOSIS — C159 Malignant neoplasm of esophagus, unspecified: Secondary | ICD-10-CM | POA: Diagnosis not present

## 2022-05-29 DIAGNOSIS — I252 Old myocardial infarction: Secondary | ICD-10-CM | POA: Insufficient documentation

## 2022-05-29 DIAGNOSIS — I517 Cardiomegaly: Secondary | ICD-10-CM | POA: Diagnosis not present

## 2022-05-29 DIAGNOSIS — C787 Secondary malignant neoplasm of liver and intrahepatic bile duct: Secondary | ICD-10-CM | POA: Diagnosis not present

## 2022-05-29 DIAGNOSIS — Z5111 Encounter for antineoplastic chemotherapy: Secondary | ICD-10-CM | POA: Insufficient documentation

## 2022-05-29 DIAGNOSIS — K2289 Other specified disease of esophagus: Secondary | ICD-10-CM | POA: Diagnosis not present

## 2022-05-29 LAB — CMP (CANCER CENTER ONLY)
ALT: 96 U/L — ABNORMAL HIGH (ref 0–44)
AST: 121 U/L — ABNORMAL HIGH (ref 15–41)
Albumin: 3.5 g/dL (ref 3.5–5.0)
Alkaline Phosphatase: 295 U/L — ABNORMAL HIGH (ref 38–126)
Anion gap: 9 (ref 5–15)
BUN: 22 mg/dL (ref 8–23)
CO2: 27 mmol/L (ref 22–32)
Calcium: 9.1 mg/dL (ref 8.9–10.3)
Chloride: 99 mmol/L (ref 98–111)
Creatinine: 0.92 mg/dL (ref 0.61–1.24)
GFR, Estimated: >60 mL/min (ref >60)
Glucose, Bld: 81 mg/dL (ref 70–99)
Potassium: 3.2 mmol/L — ABNORMAL LOW (ref 3.5–5.1)
Sodium: 135 mmol/L (ref 135–145)
Total Bilirubin: 0.6 mg/dL (ref 0.3–1.2)
Total Protein: 7.3 g/dL (ref 6.5–8.1)

## 2022-05-29 LAB — CBC WITH DIFFERENTIAL (CANCER CENTER ONLY)
Abs Immature Granulocytes: 0.01 10*3/uL (ref 0.00–0.07)
Basophils Absolute: 0.1 10*3/uL (ref 0.0–0.1)
Basophils Relative: 1 %
Eosinophils Absolute: 0.4 10*3/uL (ref 0.0–0.5)
Eosinophils Relative: 7 %
HCT: 33.4 % — ABNORMAL LOW (ref 39.0–52.0)
Hemoglobin: 10.3 g/dL — ABNORMAL LOW (ref 13.0–17.0)
Immature Granulocytes: 0 %
Lymphocytes Relative: 19 %
Lymphs Abs: 1.1 10*3/uL (ref 0.7–4.0)
MCH: 26.1 pg (ref 26.0–34.0)
MCHC: 30.8 g/dL (ref 30.0–36.0)
MCV: 84.8 fL (ref 80.0–100.0)
Monocytes Absolute: 0.4 10*3/uL (ref 0.1–1.0)
Monocytes Relative: 8 %
Neutro Abs: 3.8 10*3/uL (ref 1.7–7.7)
Neutrophils Relative %: 65 %
Platelet Count: 181 10*3/uL (ref 150–400)
RBC: 3.94 MIL/uL — ABNORMAL LOW (ref 4.22–5.81)
RDW: 20.3 % — ABNORMAL HIGH (ref 11.5–15.5)
WBC Count: 5.8 10*3/uL (ref 4.0–10.5)
nRBC: 0 % (ref 0.0–0.2)

## 2022-05-29 LAB — CEA (ACCESS): CEA (CHCC): 240.21 ng/mL — ABNORMAL HIGH (ref 0.00–5.00)

## 2022-05-29 MED ORDER — ACETAMINOPHEN 325 MG PO TABS
650.0000 mg | ORAL_TABLET | Freq: Once | ORAL | Status: AC
  Administered 2022-05-29: 650 mg via ORAL
  Filled 2022-05-29: qty 2

## 2022-05-29 MED ORDER — PALONOSETRON HCL INJECTION 0.25 MG/5ML
0.2500 mg | Freq: Once | INTRAVENOUS | Status: AC
  Administered 2022-05-29: 0.25 mg via INTRAVENOUS
  Filled 2022-05-29: qty 5

## 2022-05-29 MED ORDER — SODIUM CHLORIDE 0.9 % IV SOLN
10.0000 mg | Freq: Once | INTRAVENOUS | Status: AC
  Administered 2022-05-29: 10 mg via INTRAVENOUS
  Filled 2022-05-29: qty 10

## 2022-05-29 MED ORDER — DIPHENHYDRAMINE HCL 25 MG PO CAPS
50.0000 mg | ORAL_CAPSULE | Freq: Once | ORAL | Status: AC
  Administered 2022-05-29: 50 mg via ORAL
  Filled 2022-05-29: qty 2

## 2022-05-29 MED ORDER — DEXTROSE 5 % IV SOLN: Freq: Once | INTRAVENOUS | Status: AC

## 2022-05-29 MED ORDER — OXALIPLATIN CHEMO INJECTION 100 MG/20ML
85.0000 mg/m2 | Freq: Once | INTRAVENOUS | Status: AC
  Administered 2022-05-29: 150 mg via INTRAVENOUS
  Filled 2022-05-29: qty 10

## 2022-05-29 MED ORDER — LIDOCAINE-PRILOCAINE 2.5-2.5 % EX CREA: 1.0000 | TOPICAL_CREAM | CUTANEOUS | 1 refills | Status: AC

## 2022-05-29 MED ORDER — HEPARIN SOD (PORK) LOCK FLUSH 100 UNIT/ML IV SOLN
500.0000 [IU] | Freq: Once | INTRAVENOUS | Status: AC | PRN
  Administered 2022-05-29: 500 [IU]

## 2022-05-29 MED ORDER — SODIUM CHLORIDE 0.9% FLUSH
10.0000 mL | INTRAVENOUS | Status: DC | PRN
  Administered 2022-05-29: 10 mL

## 2022-05-29 MED ORDER — SODIUM CHLORIDE 0.9 % IV SOLN: 200.0000 mg | Freq: Once | INTRAVENOUS | Status: DC

## 2022-05-29 MED ORDER — INFLUENZA VAC SPLIT QUAD 0.5 ML IM SUSY
0.5000 mL | PREFILLED_SYRINGE | Freq: Once | INTRAMUSCULAR | Status: AC
  Administered 2022-05-29: 0.5 mL via INTRAMUSCULAR
  Filled 2022-05-29: qty 0.5

## 2022-05-29 MED ORDER — SODIUM CHLORIDE 0.9 % IV SOLN: Freq: Once | INTRAVENOUS | Status: AC

## 2022-05-29 MED ORDER — POTASSIUM CHLORIDE CRYS ER 10 MEQ PO TBCR: 20.0000 meq | EXTENDED_RELEASE_TABLET | Freq: Every day | ORAL | 1 refills | Status: DC

## 2022-05-29 MED ORDER — TRASTUZUMAB-ANNS CHEMO 150 MG IV SOLR
4.0000 mg/kg | Freq: Once | INTRAVENOUS | Status: AC
  Administered 2022-05-29: 252 mg via INTRAVENOUS
  Filled 2022-05-29: qty 12

## 2022-05-29 NOTE — Progress Notes (Signed)
[facility] CSW Progress Note  Clinical Education officer, museum contacted patient by phone to needs.  Spoke with Timmothy Sours, who was present with patient while he was in infusion today.  Timmothy Sours was able to ask [name] [name] if he had any needs at this time, and [name] declined.  Timmothy Sours continues assisting patient with transportation and his daughter and her family live with patient.    Rodman Pickle [place], LCSW    Patient is participating in a Managed Medicaid Plan:  Yes

## 2022-05-29 NOTE — Progress Notes (Signed)
Patient seen by Dr. Benay Spice today  Vitals are within treatment parameters.OK to proceed w/BP 96/78  Labs reviewed by Dr. Benay Spice and are not all within treatment parameters. AST 121 and ALT 96--OK to treat.  Will order KCL for home for K+ 3.2  Per physician team, patient is ready for treatment and there are NO modifications to the treatment plan.  [name] [name] also agrees to flu vaccine today.

## 2022-05-29 NOTE — Progress Notes (Signed)
[facility] OFFICE PROGRESS NOTE   Diagnosis: Esophagus cancer  INTERVAL HISTORY:   [name] [name] returns as scheduled.  He completed a cycle of trastuzumab, pembrolizumab, and oxaliplatin on 05/15/2022.  No rash, diarrhea, or cold sensitivity.  He reports numbness in the right hand.  This predated the last cycle of chemotherapy.  No fever or chills.  Objective:  Vital signs in last 24 hours:  Blood pressure 96/78, pulse 66, temperature 98.2 F (36.8 C), temperature source Oral, resp. rate 16, weight 142 lb 14.4 oz (64.8 kg), SpO2 94 %.    HEENT: Ulcers Resp: This of breath sounds, no respiratory distress Cardio: Regular rate and rhythm GI: No hepatosplenomegaly, nontender Vascular: No right leg edema    Portacath/PICC-without erythema  Lab Results:  Lab Results  Component Value Date   WBC 5.8 05/29/2022   HGB 10.3 (L) 05/29/2022   HCT 33.4 (L) 05/29/2022   MCV 84.8 05/29/2022   PLT 181 05/29/2022   NEUTROABS 3.8 05/29/2022    CMP  Lab Results  Component Value Date   NA 142 05/15/2022   K 4.0 05/15/2022   CL 109 05/15/2022   CO2 26 05/15/2022   GLUCOSE 90 05/15/2022   BUN 10 05/15/2022   CREATININE 0.78 05/15/2022   CALCIUM 9.2 05/15/2022   PROT 6.8 05/15/2022   ALBUMIN 3.2 (L) 05/15/2022   AST 46 (H) 05/15/2022   ALT 24 05/15/2022   ALKPHOS 210 (H) 05/15/2022   BILITOT 0.3 05/15/2022   GFRNONAA >60 05/15/2022   GFRAA >60 01/24/2020    Lab Results  Component Value Date   CEA1 287.0 (H) 02/11/2022   CEA 240.21 (H) 05/29/2022   ZOX096 88 (H) 02/11/2022    Medications: I have reviewed the patient's current medications.   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 -02/11/2022 CTA chest-7.8 x 8.5 cm subtle hypoattenuating mass lesion in segment IV of the liver. -02/11/2022 CT  abdomen/pelvis-8.7 x 7.8 or ill-defined irregular mass in the medial segment left liver, 4.5 x 3.4 cm necrotic mass lesion just cranial to the esophagogastric junction with metastatic lymphadenopathy in the abdomen and pelvis, irregular nodular peritoneal thickening in the lower abdomen and pelvis -02/11/2022 MRI abdomen-rim-enhancing likely necrotic mass or lymph node conglomerate centered around the gastroesophageal junction measuring 5.0 x 3.9 cm, large rim-enhancing likely necrotic liver mass centered in the anterior left lobe of the liver measuring 7.8 x 7.2 cm, enlarged gastrohepatic ligament and retroperitoneal lymph nodes -Labs from 02/11/2022-AFP 2.4, CA 19.9 88, CEA 287 -Ultrasound-guided liver biopsy performed 02/15/2022-adenocarcinoma with extensive necrosis, CK7 positive, negative for CK20, CDX2, TTF-1, and PAX8, consistent with a pancreatic, lung, or upper gastrointestinal primary -PET 03/08/2022-hypermetabolic distal esophagus mass, hypermetabolic centrally necrotic hepatic mass, hypermetabolic periportal nodes, exophytic GE junction mass without hypermetabolism-likely benign lesion, no hypermetabolism in the pancreas head, bilateral hypermetabolic adrenal gland lesions, small hypermetabolic left parotid gland lesion.  No loss of mismatch repair protein expression, HER2 3+ -CTs 03/11/2022-no change from PET 03/08/2022, known distal esophagus mass-not visualized on unenhanced CT, large hepatic metastasis and upper abdominal nodal metastases, chronic pancreatitis with 4.3 cm pseudocyst in the pancreas head -Cycle 1 FOLFOX, trastuzumab, Pembrolizumab 04/06/2022 -Cycle 2 oxaliplatin, 5-FU bolus/pump and trastuzumab held due to hospitalization following cycle 1 with possible coronary vasospasm -Pembrolizumab 04/25/2022 -CT abdomen/pelvis 05/02/2022-increased peripancreatic edema, increased size of pancreas head pseudocyst, increased necrotic nodal masses at the GE junction -Cycle  3 oxaliplatin,  trastuzumab, pembrolizumab 05/15/2022; 5-FU held secondary to cardiac safety following cycle 1 -Cycle 4 oxaliplatin, trastuzumab, pembrolizumab 05/29/2022, 5-FU held secondary to cardiac safety 2.  Myocardial infarction/cardiac arrest 01/31/2022 -Echocardiogram 02/01/2022-LVEF 60-65%, right ventricle severely dilated with hypokinesis in the basal and midportion, hypokinesis in the apical portion, right ventricular systolic function moderately reduced -Echocardiogram on 05/01/2022, LVEF 50-55%, decreased right ventricular systolic function, moderately enlarged right ventricle 3.  Microcytic anemia -Labs from 02/11/2022-ferritin 151, iron 13, percent saturation 5% 4.  DVT/pulmonary embolism-bilateral lower extremity DVTs 01/10/2022, pulmonary emboli on chest CT 01/31/2022-apixaban 5.  PAD status post AKA in 2021 6.  RA 7.  Bilateral pleural effusions -Ultrasound-guided thoracentesis performed on 02/11/2022.  Cytology negative for malignancy. 8.  COPD 9.  Admission 03/11/2022 with E. coli bacteremia Ultrasound-guided biopsy/aspiration of dominant necrotic appearing liver lesion 03/17/2022-scant fluid aspirated-negative culture, no organisms or white cells.  Pathology with necrotic tissue. 10.  Admission 04/29/2022 after a fall, multiple skull fractures 11.  Fever, hypotension-Klebsiella bacteremia on blood culture 04/30/2022 12.  Admission 04/09/2022 with chest pain, NSTEMI       Disposition: [name] [name] appears stable.  He tolerated the last cycle of systemic therapy well.  No evidence of a progressive malignancy.  The CEA is lower.  5-FU remains on hold secondary to the NSTEMI he experienced following cycle 1 FOLFOX.  He will continue treatment with oxaliplatin, pembrolizumab, and trastuzumab.  He will complete another cycle today.  [name] [name] will be scheduled for an office visit and treatment in 2 weeks.  Betsy Coder, MD  05/29/2022  9:26 AM

## 2022-05-29 NOTE — Patient Instructions (Signed)
[facility]   Discharge Instructions: Thank you for choosing [facility] to provide your oncology and hematology care.   If you have a lab appointment with the [facility], please go directly to the [facility] and check in at the registration area.   Wear comfortable clothing and clothing appropriate for easy access to any Portacath or PICC line.   We strive to give you quality time with your provider. You may need to reschedule your appointment if you arrive late (15 or more minutes).  Arriving late affects you and other patients whose appointments are after yours.  Also, if you miss three or more appointments without notifying the office, you may be dismissed from the clinic at the provider's discretion.      For prescription refill requests, have your pharmacy contact our office and allow 72 hours for refills to be completed.    Today you received the following chemotherapy and/or immunotherapy agents Trastuzumab-anns (KANJINTI), Pembrolizumab (KEYTRUDA) & Oxaliplatin (ELOXATIN).      To help prevent nausea and vomiting after your treatment, we encourage you to take your nausea medication as directed.  BELOW ARE SYMPTOMS THAT SHOULD BE REPORTED IMMEDIATELY: *FEVER GREATER THAN 100.4 F (38 C) OR HIGHER *CHILLS OR SWEATING *NAUSEA AND VOMITING THAT IS NOT CONTROLLED WITH YOUR NAUSEA MEDICATION *UNUSUAL SHORTNESS OF BREATH *UNUSUAL BRUISING OR BLEEDING *URINARY PROBLEMS (pain or burning when urinating, or frequent urination) *BOWEL PROBLEMS (unusual diarrhea, constipation, pain near the anus) TENDERNESS IN MOUTH AND THROAT WITH OR WITHOUT PRESENCE OF ULCERS (sore throat, sores in mouth, or a toothache) UNUSUAL RASH, SWELLING OR PAIN  UNUSUAL VAGINAL DISCHARGE OR ITCHING   Items with * indicate a potential emergency and should be followed up as soon as possible or go to the Emergency Department if any problems should occur.  Please show the  CHEMOTHERAPY ALERT CARD or IMMUNOTHERAPY ALERT CARD at check-in to the Emergency Department and triage nurse.  Should you have questions after your visit or need to cancel or reschedule your appointment, please contact [facility]  Dept: [phone]  and follow the prompts.  Office hours are 8:00 a.m. to 4:30 p.m. Monday - Friday. Please note that voicemails left after 4:00 p.m. may not be returned until the following business day.  We are closed weekends and major holidays. You have access to a nurse at all times for urgent questions. Please call the main number to the clinic Dept: [phone] and follow the prompts.   For any non-urgent questions, you may also contact your provider using MyChart. We now offer e-Visits for anyone 13 and older to request care online for non-urgent symptoms. For details visit [email or web address].   Also download the MyChart app! Go to the app store, search "MyChart", open the app, select [facility], and log in with your MyChart username and password.  Masks are optional in the cancer centers. If you would like for your care team to wear a mask while they are taking care of you, please let them know. You may have one support person who is at least 62 years old accompany you for your appointments.  Trastuzumab Injection What is this medication? TRASTUZUMAB (tras TOO zoo mab) treats breast cancer and stomach cancer. It works by blocking a protein that causes cancer cells to grow and multiply. This helps to slow or stop the spread of cancer cells. This medicine may be used for other purposes; ask your health care  provider or pharmacist if you have questions. COMMON BRAND NAME(S): Herceptin, Janae Bridgeman, Ontruzant, Trazimera What should I tell my care team before I take this medication? They need to know if you have any of these conditions: Heart failure Lung disease An unusual or allergic reaction to trastuzumab, other  medications, foods, dyes, or preservatives Pregnant or trying to get pregnant Breast-feeding How should I use this medication? This medication is injected into a vein. It is given by your care team in a hospital or clinic setting. Talk to your care team about the use of this medication in children. It is not approved for use in children. Overdosage: If you think you have taken too much of this medicine contact a poison control center or emergency room at once. NOTE: This medicine is only for you. Do not share this medicine with others. What if I miss a dose? Keep appointments for follow-up doses. It is important not to miss your dose. Call your care team if you are unable to keep an appointment. What may interact with this medication? Certain types of chemotherapy, such as daunorubicin, doxorubicin, epirubicin, idarubicin This list may not describe all possible interactions. Give your health care provider a list of all the medicines, herbs, non-prescription drugs, or dietary supplements you use. Also tell them if you smoke, drink alcohol, or use illegal drugs. Some items may interact with your medicine. What should I watch for while using this medication? Your condition will be monitored carefully while you are receiving this medication. This medication may make you feel generally unwell. This is not uncommon, as chemotherapy affects healthy cells as well as cancer cells. Report any side effects. Continue your course of treatment even though you feel ill unless your care team tells you to stop. This medication may increase your risk of getting an infection. Call your care team for advice if you get a fever, chills, sore throat, or other symptoms of a cold or flu. Do not treat yourself. Try to avoid being around people who are sick. Avoid taking medications that contain aspirin, acetaminophen, ibuprofen, naproxen, or ketoprofen unless instructed by your care team. These medications can hide a  fever. Talk to your care team if you may be pregnant. Serious birth defects can occur if you take this medication during pregnancy and for 7 months after the last dose. You will need a negative pregnancy test before starting this medication. Contraception is recommended while taking this medication and for 7 months after the last dose. Your care team can help you find the option that works for you. Do not breastfeed while taking this medication and for 7 months after stopping treatment. What side effects may I notice from receiving this medication? Side effects that you should report to your care team as soon as possible: Allergic reactions or angioedema--skin rash, itching or hives, swelling of the face, eyes, lips, tongue, arms, or legs, trouble swallowing or breathing Dry cough, shortness of breath or trouble breathing Heart failure--shortness of breath, swelling of the ankles, feet, or hands, sudden weight gain, unusual weakness or fatigue Infection--fever, chills, cough, or sore throat Infusion reactions--chest pain, shortness of breath or trouble breathing, feeling faint or lightheaded Side effects that usually do not require medical attention (report to your care team if they continue or are bothersome): Diarrhea Dizziness Headache Nausea Trouble sleeping Vomiting This list may not describe all possible side effects. Call your doctor for medical advice about side effects. You may report side effects to FDA  at 1-800-FDA-1088. Where should I keep my medication? This medication is given in a hospital or clinic. It will not be stored at home. NOTE: This sheet is a summary. It may not cover all possible information. If you have questions about this medicine, talk to your doctor, pharmacist, or health care provider.  2023 Elsevier/Gold Standard (2021-10-13 00:00:00)  Pembrolizumab Injection What is this medication? PEMBROLIZUMAB (PEM broe LIZ ue mab) treats some types of cancer. It works  by helping your immune system slow or stop the spread of cancer cells. It is a monoclonal antibody. This medicine may be used for other purposes; ask your health care provider or pharmacist if you have questions. COMMON BRAND NAME(S): Keytruda What should I tell my care team before I take this medication? They need to know if you have any of these conditions: Allogeneic stem cell transplant (uses someone else's stem cells) Autoimmune diseases, such as Crohn disease, ulcerative colitis, lupus History of chest radiation Nervous system problems, such as Guillain-Barre syndrome, myasthenia gravis Organ transplant An unusual or allergic reaction to pembrolizumab, other medications, foods, dyes, or preservatives Pregnant or trying to get pregnant Breast-feeding How should I use this medication? This medication is injected into a vein. It is given by your care team in a hospital or clinic setting. A special MedGuide will be given to you before each treatment. Be sure to read this information carefully each time. Talk to your care team about the use of this medication in children. While it may be prescribed for children as young as 6 months for selected conditions, precautions do apply. Overdosage: If you think you have taken too much of this medicine contact a poison control center or emergency room at once. NOTE: This medicine is only for you. Do not share this medicine with others. What if I miss a dose? Keep appointments for follow-up doses. It is important not to miss your dose. Call your care team if you are unable to keep an appointment. What may interact with this medication? Interactions have not been studied. This list may not describe all possible interactions. Give your health care provider a list of all the medicines, herbs, non-prescription drugs, or dietary supplements you use. Also tell them if you smoke, drink alcohol, or use illegal drugs. Some items may interact with your  medicine. What should I watch for while using this medication? Your condition will be monitored carefully while you are receiving this medication. You may need blood work while taking this medication. This medication may cause serious skin reactions. They can happen weeks to months after starting the medication. Contact your care team right away if you notice fevers or flu-like symptoms with a rash. The rash may be red or purple and then turn into blisters or peeling of the skin. You may also notice a red rash with swelling of the face, lips, or lymph nodes in your neck or under your arms. Tell your care team right away if you have any change in your eyesight. Talk to your care team if you may be pregnant. Serious birth defects can occur if you take this medication during pregnancy and for 4 months after the last dose. You will need a negative pregnancy test before starting this medication. Contraception is recommended while taking this medication and for 4 months after the last dose. Your care team can help you find the option that works for you. Do not breastfeed while taking this medication and for 4 months after the last dose.  What side effects may I notice from receiving this medication? Side effects that you should report to your care team as soon as possible: Allergic reactions--skin rash, itching, hives, swelling of the face, lips, tongue, or throat Dry cough, shortness of breath or trouble breathing Eye pain, redness, irritation, or discharge with blurry or decreased vision Heart muscle inflammation--unusual weakness or fatigue, shortness of breath, chest pain, fast or irregular heartbeat, dizziness, swelling of the ankles, feet, or hands Hormone gland problems--headache, sensitivity to light, unusual weakness or fatigue, dizziness, fast or irregular heartbeat, increased sensitivity to cold or heat, excessive sweating, constipation, hair loss, increased thirst or amount of urine, tremors or  shaking, irritability Infusion reactions--chest pain, shortness of breath or trouble breathing, feeling faint or lightheaded Kidney injury (glomerulonephritis)--decrease in the amount of urine, red or dark brown urine, foamy or bubbly urine, swelling of the ankles, hands, or feet Liver injury--right upper belly pain, loss of appetite, nausea, light-colored stool, dark yellow or brown urine, yellowing skin or eyes, unusual weakness or fatigue Pain, tingling, or numbness in the hands or feet, muscle weakness, change in vision, confusion or trouble speaking, loss of balance or coordination, trouble walking, seizures Rash, fever, and swollen lymph nodes Redness, blistering, peeling, or loosening of the skin, including inside the mouth Sudden or severe stomach pain, bloody diarrhea, fever, nausea, vomiting Side effects that usually do not require medical attention (report to your care team if they continue or are bothersome): Bone, joint, or muscle pain Diarrhea Fatigue Loss of appetite Nausea Skin rash This list may not describe all possible side effects. Call your doctor for medical advice about side effects. You may report side effects to FDA at 1-800-FDA-1088. Where should I keep my medication? This medication is given in a hospital or clinic. It will not be stored at home. NOTE: This sheet is a summary. It may not cover all possible information. If you have questions about this medicine, talk to your doctor, pharmacist, or health care provider.  2023 Elsevier/Gold Standard (2013-03-03 00:00:00)  Oxaliplatin Injection What is this medication? OXALIPLATIN (ox AL i PLA tin) treats some types of cancer. It works by slowing down the growth of cancer cells. This medicine may be used for other purposes; ask your health care provider or pharmacist if you have questions. COMMON BRAND NAME(S): Eloxatin What should I tell my care team before I take this medication? They need to know if you have any  of these conditions: Heart disease History of irregular heartbeat or rhythm Liver disease Low blood cell levels (white cells, red cells, and platelets) Lung or breathing disease, such as asthma Take medications that treat or prevent blood clots Tingling of the fingers, toes, or other nerve disorder An unusual or allergic reaction to oxaliplatin, other medications, foods, dyes, or preservatives If you or your partner are pregnant or trying to get pregnant Breast-feeding How should I use this medication? This medication is injected into a vein. It is given by your care team in a hospital or clinic setting. Talk to your care team about the use of this medication in children. Special care may be needed. Overdosage: If you think you have taken too much of this medicine contact a poison control center or emergency room at once. NOTE: This medicine is only for you. Do not share this medicine with others. What if I miss a dose? Keep appointments for follow-up doses. It is important not to miss a dose. Call your care team if you are unable  to keep an appointment. What may interact with this medication? Do not take this medication with any of the following: Cisapride Dronedarone Pimozide Thioridazine This medication may also interact with the following: Aspirin and aspirin-like medications Certain medications that treat or prevent blood clots, such as warfarin, apixaban, dabigatran, and rivaroxaban Cisplatin Cyclosporine Diuretics Medications for infection, such as acyclovir, adefovir, amphotericin B, bacitracin, cidofovir, foscarnet, ganciclovir, gentamicin, pentamidine, vancomycin NSAIDs, medications for pain and inflammation, such as ibuprofen or naproxen Other medications that cause heart rhythm changes Pamidronate Zoledronic acid This list may not describe all possible interactions. Give your health care provider a list of all the medicines, herbs, non-prescription drugs, or dietary  supplements you use. Also tell them if you smoke, drink alcohol, or use illegal drugs. Some items may interact with your medicine. What should I watch for while using this medication? Your condition will be monitored carefully while you are receiving this medication. You may need blood work while taking this medication. This medication may make you feel generally unwell. This is not uncommon as chemotherapy can affect healthy cells as well as cancer cells. Report any side effects. Continue your course of treatment even though you feel ill unless your care team tells you to stop. This medication may increase your risk of getting an infection. Call your care team for advice if you get a fever, chills, sore throat, or other symptoms of a cold or flu. Do not treat yourself. Try to avoid being around people who are sick. Avoid taking medications that contain aspirin, acetaminophen, ibuprofen, naproxen, or ketoprofen unless instructed by your care team. These medications may hide a fever. Be careful brushing or flossing your teeth or using a toothpick because you may get an infection or bleed more easily. If you have any dental work done, tell your dentist you are receiving this medication. This medication can make you more sensitive to cold. Do not drink cold drinks or use ice. Cover exposed skin before coming in contact with cold temperatures or cold objects. When out in cold weather wear warm clothing and cover your mouth and nose to warm the air that goes into your lungs. Tell your care team if you get sensitive to the cold. Talk to your care team if you or your partner are pregnant or think either of you might be pregnant. This medication can cause serious birth defects if taken during pregnancy and for 9 months after the last dose. A negative pregnancy test is required before starting this medication. A reliable form of contraception is recommended while taking this medication and for 9 months after the  last dose. Talk to your care team about effective forms of contraception. Do not father a child while taking this medication and for 6 months after the last dose. Use a condom while having sex during this time period. Do not breastfeed while taking this medication and for 3 months after the last dose. This medication may cause infertility. Talk to your care team if you are concerned about your fertility. What side effects may I notice from receiving this medication? Side effects that you should report to your care team as soon as possible: Allergic reactions--skin rash, itching, hives, swelling of the face, lips, tongue, or throat Bleeding--bloody or black, tar-like stools, vomiting blood or brown material that looks like coffee grounds, red or dark brown urine, small red or purple spots on skin, unusual bruising or bleeding Dry cough, shortness of breath or trouble breathing Heart rhythm changes--fast or irregular  heartbeat, dizziness, feeling faint or lightheaded, chest pain, trouble breathing Infection--fever, chills, cough, sore throat, wounds that don't heal, pain or trouble when passing urine, general feeling of discomfort or being unwell Liver injury--right upper belly pain, loss of appetite, nausea, light-colored stool, dark yellow or brown urine, yellowing skin or eyes, unusual weakness or fatigue Low red blood cell level--unusual weakness or fatigue, dizziness, headache, trouble breathing Muscle injury--unusual weakness or fatigue, muscle pain, dark yellow or brown urine, decrease in amount of urine Pain, tingling, or numbness in the hands or feet Sudden and severe headache, confusion, change in vision, seizures, which may be signs of posterior reversible encephalopathy syndrome (PRES) Unusual bruising or bleeding Side effects that usually do not require medical attention (report to your care team if they continue or are bothersome): Diarrhea Nausea Pain, redness, or swelling with sores  inside the mouth or throat Unusual weakness or fatigue Vomiting This list may not describe all possible side effects. Call your doctor for medical advice about side effects. You may report side effects to FDA at 1-800-FDA-1088. Where should I keep my medication? This medication is given in a hospital or clinic. It will not be stored at home. NOTE: This sheet is a summary. It may not cover all possible information. If you have questions about this medicine, talk to your doctor, pharmacist, or health care provider.  2023 Elsevier/Gold Standard (2007-08-03 00:00:00)  Influenza Vaccine Injection What is this medication? INFLUENZA VACCINE (in floo EN zuh vak SEEN) reduces the risk of the influenza (flu). It does not treat influenza. It is still possible to get influenza after receiving this vaccine, but the symptoms may be less severe or not last as long. It works by helping your immune system learn how to fight off a future infection. This medicine may be used for other purposes; ask your health care provider or pharmacist if you have questions. COMMON BRAND NAME(S): Afluria, Afluria Quadrivalent, Agriflu, Alfuria, FLUAD, FLUAD Quadrivalent, Fluarix, Fluarix Quadrivalent, Flublok, Flublok Quadrivalent, FLUCELVAX, FLUCELVAX Quadrivalent, Flulaval, Flulaval Quadrivalent, Fluvirin, Fluzone, Fluzone High-Dose, Fluzone Intradermal, Fluzone Quadrivalent What should I tell my care team before I take this medication? They need to know if you have any of these conditions: Bleeding disorder like hemophilia Fever or infection Guillain-Barre syndrome or other neurological problems Immune system problems Infection with the human immunodeficiency virus (HIV) or AIDS Low blood platelet counts Multiple sclerosis An unusual or allergic reaction to influenza virus vaccine, latex, other medications, foods, dyes, or preservatives. Different brands of vaccines contain different allergens. Some may contain latex or  eggs. Talk to your care team about your allergies to make sure that you get the right vaccine. Pregnant or trying to get pregnant Breastfeeding How should I use this medication? This vaccine is injected into a muscle or under the skin. It is given by your care team. A copy of Vaccine Information Statements will be given before each vaccination. Be sure to read this sheet carefully each time. This sheet may change often. Talk to your care team to see which vaccines are right for you. Some vaccines should not be used in all age groups. Overdosage: If you think you have taken too much of this medicine contact a poison control center or emergency room at once. NOTE: This medicine is only for you. Do not share this medicine with others. What if I miss a dose? This does not apply. What may interact with this medication? Certain medications that lower your immune system, such as etanercept, anakinra,  infliximab, adalimumab Certain medications that prevent or treat blood clots, such as warfarin Chemotherapy or radiation therapy Phenytoin Steroid medications, such as prednisone or cortisone Theophylline Vaccines This list may not describe all possible interactions. Give your health care provider a list of all the medicines, herbs, non-prescription drugs, or dietary supplements you use. Also tell them if you smoke, drink alcohol, or use illegal drugs. Some items may interact with your medicine. What should I watch for while using this medication? Report any side effects that do not go away with your care team. Call your care team if any unusual symptoms occur within 6 weeks of receiving this vaccine. You may still catch the flu, but the illness is not usually as bad. You cannot get the flu from the vaccine. The vaccine will not protect against colds or other illnesses that may cause fever. The vaccine is needed every year. What side effects may I notice from receiving this medication? Side effects that  you should report to your care team as soon as possible: Allergic reactions--skin rash, itching, hives, swelling of the face, lips, tongue, or throat Side effects that usually do not require medical attention (report these to your care team if they continue or are bothersome): Chills Fatigue Headache Joint pain Loss of appetite Muscle pain Nausea Pain, redness, or irritation at injection site This list may not describe all possible side effects. Call your doctor for medical advice about side effects. You may report side effects to FDA at 1-800-FDA-1088. Where should I keep my medication? The vaccine is only given by your care team. It will not be stored at home. NOTE: This sheet is a summary. It may not cover all possible information. If you have questions about this medicine, talk to your doctor, pharmacist, or health care provider.  2023 Elsevier/Gold Standard (2007-08-03 00:00:00)

## 2022-05-30 ENCOUNTER — Other Ambulatory Visit: Payer: Self-pay

## 2022-05-30 DIAGNOSIS — M069 Rheumatoid arthritis, unspecified: Secondary | ICD-10-CM | POA: Diagnosis not present

## 2022-05-30 DIAGNOSIS — S0240CD Maxillary fracture, right side, subsequent encounter for fracture with routine healing: Secondary | ICD-10-CM | POA: Diagnosis not present

## 2022-05-30 DIAGNOSIS — S0240ED Zygomatic fracture, right side, subsequent encounter for fracture with routine healing: Secondary | ICD-10-CM | POA: Diagnosis not present

## 2022-05-30 DIAGNOSIS — C159 Malignant neoplasm of esophagus, unspecified: Secondary | ICD-10-CM | POA: Diagnosis not present

## 2022-05-30 DIAGNOSIS — S0231XD Fracture of orbital floor, right side, subsequent encounter for fracture with routine healing: Secondary | ICD-10-CM | POA: Diagnosis not present

## 2022-05-30 DIAGNOSIS — S02841D Fracture of lateral orbital wall, right side, subsequent encounter for fracture with routine healing: Secondary | ICD-10-CM | POA: Diagnosis not present

## 2022-05-31 ENCOUNTER — Other Ambulatory Visit: Payer: Self-pay | Admitting: Student

## 2022-05-31 ENCOUNTER — Inpatient Hospital Stay: Payer: Medicare Other

## 2022-05-31 ENCOUNTER — Telehealth: Payer: Self-pay | Admitting: *Deleted

## 2022-05-31 DIAGNOSIS — M0579 Rheumatoid arthritis with rheumatoid factor of multiple sites without organ or systems involvement: Secondary | ICD-10-CM

## 2022-05-31 DIAGNOSIS — E785 Hyperlipidemia, unspecified: Secondary | ICD-10-CM

## 2022-05-31 MED ORDER — ISOSORBIDE MONONITRATE ER 30 MG PO TB24: 30.0000 mg | ORAL_TABLET | Freq: Every day | ORAL | 0 refills | Status: DC

## 2022-05-31 NOTE — Telephone Encounter (Signed)
prochlorperazine (COMPAZINE) 10 MG tablet   methotrexate (RHEUMATREX) 7.5 MG tablet   atorvastatin (LIPITOR) 40 MG tablet   isosorbide mononitrate (IMDUR) 30 MG 24 hr tablet   meloxicam (MOBIC) 7.5 MG tablet   Prednisone   [id number] - [place], [place] - [address] AT [address]

## 2022-05-31 NOTE — Telephone Encounter (Signed)
Received call from [place], [place] with [facility]. Reporting patient fell out of bed while transferring to w/c on 05/29/22. States he's ok, denies pain or injury, did not hit his head. He has f/u scheduled for 12/21.

## 2022-06-05 DIAGNOSIS — C159 Malignant neoplasm of esophagus, unspecified: Secondary | ICD-10-CM | POA: Diagnosis not present

## 2022-06-05 DIAGNOSIS — M069 Rheumatoid arthritis, unspecified: Secondary | ICD-10-CM | POA: Diagnosis not present

## 2022-06-05 DIAGNOSIS — S0231XD Fracture of orbital floor, right side, subsequent encounter for fracture with routine healing: Secondary | ICD-10-CM | POA: Diagnosis not present

## 2022-06-05 DIAGNOSIS — S02841D Fracture of lateral orbital wall, right side, subsequent encounter for fracture with routine healing: Secondary | ICD-10-CM | POA: Diagnosis not present

## 2022-06-05 DIAGNOSIS — S0240CD Maxillary fracture, right side, subsequent encounter for fracture with routine healing: Secondary | ICD-10-CM | POA: Diagnosis not present

## 2022-06-05 DIAGNOSIS — S0240ED Zygomatic fracture, right side, subsequent encounter for fracture with routine healing: Secondary | ICD-10-CM | POA: Diagnosis not present

## 2022-06-06 DIAGNOSIS — S0231XD Fracture of orbital floor, right side, subsequent encounter for fracture with routine healing: Secondary | ICD-10-CM | POA: Diagnosis not present

## 2022-06-06 DIAGNOSIS — M069 Rheumatoid arthritis, unspecified: Secondary | ICD-10-CM | POA: Diagnosis not present

## 2022-06-06 DIAGNOSIS — S0240CD Maxillary fracture, right side, subsequent encounter for fracture with routine healing: Secondary | ICD-10-CM | POA: Diagnosis not present

## 2022-06-06 DIAGNOSIS — C159 Malignant neoplasm of esophagus, unspecified: Secondary | ICD-10-CM | POA: Diagnosis not present

## 2022-06-06 DIAGNOSIS — S02841D Fracture of lateral orbital wall, right side, subsequent encounter for fracture with routine healing: Secondary | ICD-10-CM | POA: Diagnosis not present

## 2022-06-06 DIAGNOSIS — S0240ED Zygomatic fracture, right side, subsequent encounter for fracture with routine healing: Secondary | ICD-10-CM | POA: Diagnosis not present

## 2022-06-07 ENCOUNTER — Inpatient Hospital Stay: Payer: Medicare Other | Admitting: Nutrition

## 2022-06-07 ENCOUNTER — Other Ambulatory Visit: Payer: Self-pay

## 2022-06-07 DIAGNOSIS — M0579 Rheumatoid arthritis with rheumatoid factor of multiple sites without organ or systems involvement: Secondary | ICD-10-CM

## 2022-06-07 DIAGNOSIS — E785 Hyperlipidemia, unspecified: Secondary | ICD-10-CM

## 2022-06-07 NOTE — Progress Notes (Signed)
61 year old male diagnosed with metastatic cancer, likely Esophageal cancer. He is followed by Dr. Benay Spice and receives Oxaliplatin, Pembrolizumab, and trastuzmab.  PMH includes GERD, Stroke, hyperglycemia, HTN, PVD, Pressure Injury, RA and STEMI  Medications include Zofran, Compazine, and Percocet.  Labs include potassium 3.2. Height: 67 inches Weight: 142 pounds 14.4 oz UBW: 148 pounds November 5th confirmed as usual wt by patient. BMI: 22.38  Patient reports he has a good appetite. He has friends who help care for him in his home. Reports eating 2 cans of soup, a pot pie and a ham sandwich for breakfast. He usually eats pizza or a corn dog for lunch. For dinner, he consumes chicken, steak or hamburgers and french fries. He snacks on chips or crackers. He drinks sweet iced tea, and mello yellow. Occasionally will drink whole milk. Reports he likes dried beans and bananas and vegetables. Denies diarrhea. Reports nausea improved after taking nausea medication.  Nutrition Diagnosis: Food and Nutrition Related Knowledge Deficit related to cancer and associated treatments as evidenced by no prior need for nutrition related information.  Intervention: Educated to eat smaller amounts more often to minimize wt loss. Encouraged healthier food choices. Encouraged patient to drink more water. Reviewed high protein foods. Provided fact sheets. Answered questions and provided contact information.  Monitoring, Evaluation, Goals: Patient will tolerate adequate calories and protein to maintain weight.  Next Visit: Wednesday, January 3 in infusion.

## 2022-06-08 DIAGNOSIS — S02841D Fracture of lateral orbital wall, right side, subsequent encounter for fracture with routine healing: Secondary | ICD-10-CM | POA: Diagnosis not present

## 2022-06-08 DIAGNOSIS — C159 Malignant neoplasm of esophagus, unspecified: Secondary | ICD-10-CM | POA: Diagnosis not present

## 2022-06-08 DIAGNOSIS — S0240CD Maxillary fracture, right side, subsequent encounter for fracture with routine healing: Secondary | ICD-10-CM | POA: Diagnosis not present

## 2022-06-08 DIAGNOSIS — S0231XD Fracture of orbital floor, right side, subsequent encounter for fracture with routine healing: Secondary | ICD-10-CM | POA: Diagnosis not present

## 2022-06-08 DIAGNOSIS — S0240ED Zygomatic fracture, right side, subsequent encounter for fracture with routine healing: Secondary | ICD-10-CM | POA: Diagnosis not present

## 2022-06-08 DIAGNOSIS — M069 Rheumatoid arthritis, unspecified: Secondary | ICD-10-CM | POA: Diagnosis not present

## 2022-06-09 ENCOUNTER — Other Ambulatory Visit: Payer: Self-pay | Admitting: Student

## 2022-06-09 DIAGNOSIS — M0579 Rheumatoid arthritis with rheumatoid factor of multiple sites without organ or systems involvement: Secondary | ICD-10-CM

## 2022-06-09 DIAGNOSIS — E785 Hyperlipidemia, unspecified: Secondary | ICD-10-CM

## 2022-06-09 NOTE — Telephone Encounter (Signed)
Requesting to speak with a nurse about getting all his medications filled. Pt states he called on 06/07/2022 for meds refill, no one has called him back. Please call pt back.

## 2022-06-11 ENCOUNTER — Other Ambulatory Visit: Payer: Self-pay | Admitting: Oncology

## 2022-06-12 ENCOUNTER — Inpatient Hospital Stay: Payer: Medicare Other

## 2022-06-12 ENCOUNTER — Encounter: Payer: Self-pay | Admitting: Nurse Practitioner

## 2022-06-12 ENCOUNTER — Inpatient Hospital Stay (HOSPITAL_BASED_OUTPATIENT_CLINIC_OR_DEPARTMENT_OTHER): Payer: Medicare Other | Admitting: Nurse Practitioner

## 2022-06-12 ENCOUNTER — Other Ambulatory Visit: Payer: Self-pay

## 2022-06-12 VITALS — BP 112/81 | HR 68 | Temp 98.2°F | Resp 20 | Ht 67.0 in | Wt 139.0 lb

## 2022-06-12 VITALS — BP 129/84 | HR 82

## 2022-06-12 DIAGNOSIS — I252 Old myocardial infarction: Secondary | ICD-10-CM | POA: Diagnosis not present

## 2022-06-12 DIAGNOSIS — C159 Malignant neoplasm of esophagus, unspecified: Secondary | ICD-10-CM

## 2022-06-12 DIAGNOSIS — Z79899 Other long term (current) drug therapy: Secondary | ICD-10-CM | POA: Diagnosis not present

## 2022-06-12 DIAGNOSIS — Z23 Encounter for immunization: Secondary | ICD-10-CM | POA: Diagnosis not present

## 2022-06-12 DIAGNOSIS — Z5111 Encounter for antineoplastic chemotherapy: Secondary | ICD-10-CM | POA: Diagnosis not present

## 2022-06-12 DIAGNOSIS — K2289 Other specified disease of esophagus: Secondary | ICD-10-CM | POA: Diagnosis not present

## 2022-06-12 DIAGNOSIS — R970 Elevated carcinoembryonic antigen [CEA]: Secondary | ICD-10-CM | POA: Diagnosis not present

## 2022-06-12 DIAGNOSIS — C801 Malignant (primary) neoplasm, unspecified: Secondary | ICD-10-CM

## 2022-06-12 DIAGNOSIS — E876 Hypokalemia: Secondary | ICD-10-CM

## 2022-06-12 DIAGNOSIS — I517 Cardiomegaly: Secondary | ICD-10-CM | POA: Diagnosis not present

## 2022-06-12 DIAGNOSIS — Z86711 Personal history of pulmonary embolism: Secondary | ICD-10-CM | POA: Diagnosis not present

## 2022-06-12 DIAGNOSIS — Z86718 Personal history of other venous thrombosis and embolism: Secondary | ICD-10-CM | POA: Diagnosis not present

## 2022-06-12 DIAGNOSIS — Z7901 Long term (current) use of anticoagulants: Secondary | ICD-10-CM | POA: Diagnosis not present

## 2022-06-12 DIAGNOSIS — C787 Secondary malignant neoplasm of liver and intrahepatic bile duct: Secondary | ICD-10-CM | POA: Diagnosis not present

## 2022-06-12 LAB — CMP (CANCER CENTER ONLY)
ALT: 18 U/L (ref 0–44)
AST: 36 U/L (ref 15–41)
Albumin: 3.3 g/dL — ABNORMAL LOW (ref 3.5–5.0)
Alkaline Phosphatase: 162 U/L — ABNORMAL HIGH (ref 38–126)
Anion gap: 11 (ref 5–15)
BUN: 13 mg/dL (ref 8–23)
CO2: 27 mmol/L (ref 22–32)
Calcium: 8.8 mg/dL — ABNORMAL LOW (ref 8.9–10.3)
Chloride: 102 mmol/L (ref 98–111)
Creatinine: 0.58 mg/dL — ABNORMAL LOW (ref 0.61–1.24)
GFR, Estimated: >60 mL/min (ref >60)
Glucose, Bld: 127 mg/dL — ABNORMAL HIGH (ref 70–99)
Potassium: 2.8 mmol/L — ABNORMAL LOW (ref 3.5–5.1)
Sodium: 140 mmol/L (ref 135–145)
Total Bilirubin: 0.3 mg/dL (ref 0.3–1.2)
Total Protein: 7 g/dL (ref 6.5–8.1)

## 2022-06-12 LAB — CBC WITH DIFFERENTIAL (CANCER CENTER ONLY)
Abs Immature Granulocytes: 0.01 10*3/uL (ref 0.00–0.07)
Basophils Absolute: 0 10*3/uL (ref 0.0–0.1)
Basophils Relative: 1 %
Eosinophils Absolute: 0.2 10*3/uL (ref 0.0–0.5)
Eosinophils Relative: 3 %
HCT: 34.6 % — ABNORMAL LOW (ref 39.0–52.0)
Hemoglobin: 10.9 g/dL — ABNORMAL LOW (ref 13.0–17.0)
Immature Granulocytes: 0 %
Lymphocytes Relative: 25 %
Lymphs Abs: 1.3 10*3/uL (ref 0.7–4.0)
MCH: 26.7 pg (ref 26.0–34.0)
MCHC: 31.5 g/dL (ref 30.0–36.0)
MCV: 84.6 fL (ref 80.0–100.0)
Monocytes Absolute: 0.5 10*3/uL (ref 0.1–1.0)
Monocytes Relative: 10 %
Neutro Abs: 3.2 10*3/uL (ref 1.7–7.7)
Neutrophils Relative %: 61 %
Platelet Count: 169 10*3/uL (ref 150–400)
RBC: 4.09 MIL/uL — ABNORMAL LOW (ref 4.22–5.81)
RDW: 20.4 % — ABNORMAL HIGH (ref 11.5–15.5)
WBC Count: 5.3 10*3/uL (ref 4.0–10.5)
nRBC: 0 % (ref 0.0–0.2)

## 2022-06-12 LAB — TSH: TSH: 1.143 u[IU]/mL (ref 0.350–4.500)

## 2022-06-12 LAB — MAGNESIUM: Magnesium: 1.4 mg/dL — ABNORMAL LOW (ref 1.7–2.4)

## 2022-06-12 MED ORDER — ACETAMINOPHEN 325 MG PO TABS
650.0000 mg | ORAL_TABLET | Freq: Once | ORAL | Status: AC
  Administered 2022-06-12: 650 mg via ORAL
  Filled 2022-06-12: qty 2

## 2022-06-12 MED ORDER — POTASSIUM CHLORIDE 10 MEQ/100ML IV SOLN
10.0000 meq | INTRAVENOUS | Status: AC
  Administered 2022-06-12 (×2): 10 meq via INTRAVENOUS
  Filled 2022-06-12: qty 100

## 2022-06-12 MED ORDER — PALONOSETRON HCL INJECTION 0.25 MG/5ML
0.2500 mg | Freq: Once | INTRAVENOUS | Status: AC
  Administered 2022-06-12: 0.25 mg via INTRAVENOUS
  Filled 2022-06-12: qty 5

## 2022-06-12 MED ORDER — OXALIPLATIN CHEMO INJECTION 100 MG/20ML
85.0000 mg/m2 | Freq: Once | INTRAVENOUS | Status: AC
  Administered 2022-06-12: 150 mg via INTRAVENOUS
  Filled 2022-06-12: qty 10

## 2022-06-12 MED ORDER — HEPARIN SOD (PORK) LOCK FLUSH 100 UNIT/ML IV SOLN
500.0000 [IU] | Freq: Once | INTRAVENOUS | Status: AC | PRN
  Administered 2022-06-12: 500 [IU]

## 2022-06-12 MED ORDER — DIPHENHYDRAMINE HCL 25 MG PO CAPS
50.0000 mg | ORAL_CAPSULE | Freq: Once | ORAL | Status: AC
  Administered 2022-06-12: 50 mg via ORAL
  Filled 2022-06-12: qty 2

## 2022-06-12 MED ORDER — DEXTROSE 5 % IV SOLN: Freq: Once | INTRAVENOUS | Status: AC

## 2022-06-12 MED ORDER — SODIUM CHLORIDE 0.9 % IV SOLN: Freq: Once | INTRAVENOUS | Status: AC

## 2022-06-12 MED ORDER — ONDANSETRON HCL 8 MG PO TABS: ORAL_TABLET | ORAL | 0 refills | Status: DC

## 2022-06-12 MED ORDER — SODIUM CHLORIDE 0.9% FLUSH
10.0000 mL | INTRAVENOUS | Status: DC | PRN
  Administered 2022-06-12: 10 mL

## 2022-06-12 MED ORDER — TRASTUZUMAB-ANNS CHEMO 150 MG IV SOLR
4.0000 mg/kg | Freq: Once | INTRAVENOUS | Status: AC
  Administered 2022-06-12: 252 mg via INTRAVENOUS
  Filled 2022-06-12: qty 12

## 2022-06-12 MED ORDER — POTASSIUM CHLORIDE CRYS ER 20 MEQ PO TBCR
20.0000 meq | EXTENDED_RELEASE_TABLET | Freq: Once | ORAL | Status: AC
  Administered 2022-06-12: 20 meq via ORAL
  Filled 2022-06-12: qty 1

## 2022-06-12 MED ORDER — SODIUM CHLORIDE 0.9 % IV SOLN
10.0000 mg | Freq: Once | INTRAVENOUS | Status: AC
  Administered 2022-06-12: 10 mg via INTRAVENOUS
  Filled 2022-06-12: qty 1

## 2022-06-12 MED ORDER — METOPROLOL SUCCINATE ER 25 MG PO TB24: 25.0000 mg | ORAL_TABLET | Freq: Every day | ORAL | 3 refills | Status: DC

## 2022-06-12 MED ORDER — ATORVASTATIN CALCIUM 40 MG PO TABS: 40.0000 mg | ORAL_TABLET | Freq: Every day | ORAL | 3 refills | Status: DC

## 2022-06-12 MED ORDER — SODIUM CHLORIDE 0.9 % IV SOLN
400.0000 mg | Freq: Once | INTRAVENOUS | Status: AC
  Administered 2022-06-12: 400 mg via INTRAVENOUS
  Filled 2022-06-12: qty 16

## 2022-06-12 NOTE — Progress Notes (Signed)
Patient changed to 400 mg Keytruda. Will adjust treatment plan to reflect correct interval per provider.

## 2022-06-12 NOTE — Progress Notes (Signed)
[name]   Diagnosis: Esophagus cancer  INTERVAL HISTORY:   [name] [name] returns as scheduled.  He completed a treatment with oxaliplatin and trastuzumab 05/29/2022.  He denies nausea/vomiting.  No mouth sores.  No diarrhea.  No rash.  No bleeding.  Appetite is described as "so-so".  Cold sensitivity lasted 2 to 3 days.  No persistent neuropathy symptoms.  He confirms he has not started oral potassium replacement.  Objective:  Vital signs in last 24 hours:  Blood pressure 112/81, pulse 68, temperature 98.2 F (36.8 C), temperature source Oral, resp. rate 20, height _0  (1.702 m), weight 139 lb (63 kg), SpO2 95 %.    HEENT: No thrush or ulcers. Resp: Lungs clear bilaterally. Cardio: Regular rate and rhythm. GI: Abdomen soft and nontender.  No hepatomegaly. Vascular: No right leg edema. Skin: Palms without erythema. Port-A-Cath without erythema.   Lab Results:  Lab Results  Component Value Date   WBC 5.3 06/12/2022   HGB 10.9 (L) 06/12/2022   HCT 34.6 (L) 06/12/2022   MCV 84.6 06/12/2022   PLT 169 06/12/2022   NEUTROABS 3.2 06/12/2022    Imaging:  No results found.  Medications: I have reviewed the patient's current medications.  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 -02/11/2022 CTA chest-7.8 x 8.5 cm subtle hypoattenuating mass lesion in segment IV of the liver. -02/11/2022 CT abdomen/pelvis-8.7 x 7.8 or ill-defined irregular mass in the medial segment left liver, 4.5 x 3.4 cm necrotic mass lesion just cranial to the esophagogastric junction with metastatic lymphadenopathy in the abdomen and pelvis, irregular nodular peritoneal thickening in the lower abdomen and pelvis -02/11/2022 MRI abdomen-rim-enhancing likely necrotic mass or lymph node conglomerate centered around the  gastroesophageal junction measuring 5.0 x 3.9 cm, large rim-enhancing likely necrotic liver mass centered in the anterior left lobe of the liver measuring 7.8 x 7.2 cm, enlarged gastrohepatic ligament and retroperitoneal lymph nodes -Labs from 02/11/2022-AFP 2.4, CA 19.9 88, CEA 287 -Ultrasound-guided liver biopsy performed 02/15/2022-adenocarcinoma with extensive necrosis, CK7 positive, negative for CK20, CDX2, TTF-1, and PAX8, consistent with a pancreatic, lung, or upper gastrointestinal primary -PET 03/08/2022-hypermetabolic distal esophagus mass, hypermetabolic centrally necrotic hepatic mass, hypermetabolic periportal nodes, exophytic GE junction mass without hypermetabolism-likely benign lesion, no hypermetabolism in the pancreas head, bilateral hypermetabolic adrenal gland lesions, small hypermetabolic left parotid gland lesion.  No loss of mismatch repair protein expression, HER2 3+ -CTs 03/11/2022-no change from PET 03/08/2022, known distal esophagus mass-not visualized on unenhanced CT, large hepatic metastasis and upper abdominal nodal metastases, chronic pancreatitis with 4.3 cm pseudocyst in the pancreas head -Cycle 1 FOLFOX, trastuzumab, Pembrolizumab 04/06/2022 -Cycle 2 oxaliplatin, 5-FU bolus/pump and trastuzumab held due to hospitalization following cycle 1 with possible coronary vasospasm -Pembrolizumab 04/25/2022 -CT abdomen/pelvis 05/02/2022-increased peripancreatic edema, increased size of pancreas head pseudocyst, increased necrotic nodal masses at the GE junction -Cycle 3 oxaliplatin, trastuzumab, pembrolizumab 05/15/2022; 5-FU held secondary to cardiac safety following cycle 1 -Cycle 4 oxaliplatin, trastuzumab 05/29/2022, 5-FU held secondary to cardiac safety -Cycle 5 oxaliplatin, trastuzumab, Pembrolizumab 06/12/2022, 5-FU held secondary to cardiac safety 2.  Myocardial infarction/cardiac arrest 01/31/2022 -Echocardiogram 02/01/2022-LVEF 60-65%, right ventricle severely dilated with  hypokinesis in the basal and midportion, hypokinesis in the apical portion, right ventricular systolic function moderately reduced -Echocardiogram on 05/01/2022, LVEF 50-55%, decreased right ventricular systolic function, moderately enlarged right ventricle 3.  Microcytic anemia -Labs from 02/11/2022-ferritin 151,  iron 13, percent saturation 5% 4.  DVT/pulmonary embolism-bilateral lower extremity DVTs 01/10/2022, pulmonary emboli on chest CT 01/31/2022-apixaban 5.  PAD status post AKA in 2021 6.  RA 7.  Bilateral pleural effusions -Ultrasound-guided thoracentesis performed on 02/11/2022.  Cytology negative for malignancy. 8.  COPD 9.  Admission 03/11/2022 with E. coli bacteremia Ultrasound-guided biopsy/aspiration of dominant necrotic appearing liver lesion 03/17/2022-scant fluid aspirated-negative culture, no organisms or white cells.  Pathology with necrotic tissue. 10.  Admission 04/29/2022 after a fall, multiple skull fractures 11.  Fever, hypotension-Klebsiella bacteremia on blood culture 04/30/2022 12.  Admission 04/09/2022 with chest pain, NSTEMI    Disposition: [name] [name] appears stable.  He completed a cycle of oxaliplatin and trastuzumab 2 weeks ago.  He seems to be tolerating treatment well.  Plan to proceed with oxaliplatin, trastuzumab, Pembrolizumab today as scheduled.  CBC and chemistry panel reviewed.  Labs adequate to proceed as above.  He has persistent hypokalemia.  He has not started potassium as previously prescribed.  He plans to pick up the prescription today.  He will receive 20 meq of potassium IV today as well as an oral dose of 20 meq.  Beginning 06/13/2022 he will take K-Dur 20 meq twice daily for 3 days, then 20 meq daily.  We will add a magnesium to today's labs.  He will return for lab, follow-up, oxaliplatin and trastuzumab in 2 weeks.  Ned Card ANP/GNP-BC   06/12/2022  9:10 AM

## 2022-06-12 NOTE — Patient Instructions (Signed)
[facility]   Discharge Instructions: Thank you for choosing [facility] to provide your oncology and hematology care.   If you have a lab appointment with the [facility], please go directly to the [facility] and check in at the registration area.   Wear comfortable clothing and clothing appropriate for easy access to any Portacath or PICC line.   We strive to give you quality time with your provider. You may need to reschedule your appointment if you arrive late (15 or more minutes).  Arriving late affects you and other patients whose appointments are after yours.  Also, if you miss three or more appointments without notifying the office, you may be dismissed from the clinic at the provider's discretion.      For prescription refill requests, have your pharmacy contact our office and allow 72 hours for refills to be completed.    Today you received the following chemotherapy and/or immunotherapy agents Pembrolizumab (KEYTRUDA), Trastuzumab-anns (KANJINTI) & Oxaliplatin (ELOXATIN).      To help prevent nausea and vomiting after your treatment, we encourage you to take your nausea medication as directed.  BELOW ARE SYMPTOMS THAT SHOULD BE REPORTED IMMEDIATELY: *FEVER GREATER THAN 100.4 F (38 C) OR HIGHER *CHILLS OR SWEATING *NAUSEA AND VOMITING THAT IS NOT CONTROLLED WITH YOUR NAUSEA MEDICATION *UNUSUAL SHORTNESS OF BREATH *UNUSUAL BRUISING OR BLEEDING *URINARY PROBLEMS (pain or burning when urinating, or frequent urination) *BOWEL PROBLEMS (unusual diarrhea, constipation, pain near the anus) TENDERNESS IN MOUTH AND THROAT WITH OR WITHOUT PRESENCE OF ULCERS (sore throat, sores in mouth, or a toothache) UNUSUAL RASH, SWELLING OR PAIN  UNUSUAL VAGINAL DISCHARGE OR ITCHING   Items with * indicate a potential emergency and should be followed up as soon as possible or go to the Emergency Department if any problems should occur.  Please show the  CHEMOTHERAPY ALERT CARD or IMMUNOTHERAPY ALERT CARD at check-in to the Emergency Department and triage nurse.  Should you have questions after your visit or need to cancel or reschedule your appointment, please contact [facility]  Dept: [phone]  and follow the prompts.  Office hours are 8:00 a.m. to 4:30 p.m. Monday - Friday. Please note that voicemails left after 4:00 p.m. may not be returned until the following business day.  We are closed weekends and major holidays. You have access to a nurse at all times for urgent questions. Please call the main number to the clinic Dept: [phone] and follow the prompts.   For any non-urgent questions, you may also contact your provider using MyChart. We now offer e-Visits for anyone 41 and older to request care online for non-urgent symptoms. For details visit [email or web address].   Also download the MyChart app! Go to the app store, search "MyChart", open the app, select [facility], and log in with your MyChart username and password.  Masks are optional in the cancer centers. If you would like for your care team to wear a mask while they are taking care of you, please let them know. You may have one support person who is at least 62 years old accompany you for your appointments.  Pembrolizumab Injection What is this medication? PEMBROLIZUMAB (PEM broe LIZ ue mab) treats some types of cancer. It works by helping your immune system slow or stop the spread of cancer cells. It is a monoclonal antibody. This medicine may be used for other purposes; ask your health care provider or pharmacist if you  have questions. COMMON BRAND NAME(S): Keytruda What should I tell my care team before I take this medication? They need to know if you have any of these conditions: Allogeneic stem cell transplant (uses someone else's stem cells) Autoimmune diseases, such as Crohn disease, ulcerative colitis, lupus History of chest  radiation Nervous system problems, such as Guillain-Barre syndrome, myasthenia gravis Organ transplant An unusual or allergic reaction to pembrolizumab, other medications, foods, dyes, or preservatives Pregnant or trying to get pregnant Breast-feeding How should I use this medication? This medication is injected into a vein. It is given by your care team in a hospital or clinic setting. A special MedGuide will be given to you before each treatment. Be sure to read this information carefully each time. Talk to your care team about the use of this medication in children. While it may be prescribed for children as young as 6 months for selected conditions, precautions do apply. Overdosage: If you think you have taken too much of this medicine contact a poison control center or emergency room at once. NOTE: This medicine is only for you. Do not share this medicine with others. What if I miss a dose? Keep appointments for follow-up doses. It is important not to miss your dose. Call your care team if you are unable to keep an appointment. What may interact with this medication? Interactions have not been studied. This list may not describe all possible interactions. Give your health care provider a list of all the medicines, herbs, non-prescription drugs, or dietary supplements you use. Also tell them if you smoke, drink alcohol, or use illegal drugs. Some items may interact with your medicine. What should I watch for while using this medication? Your condition will be monitored carefully while you are receiving this medication. You may need blood work while taking this medication. This medication may cause serious skin reactions. They can happen weeks to months after starting the medication. Contact your care team right away if you notice fevers or flu-like symptoms with a rash. The rash may be red or purple and then turn into blisters or peeling of the skin. You may also notice a red rash with  swelling of the face, lips, or lymph nodes in your neck or under your arms. Tell your care team right away if you have any change in your eyesight. Talk to your care team if you may be pregnant. Serious birth defects can occur if you take this medication during pregnancy and for 4 months after the last dose. You will need a negative pregnancy test before starting this medication. Contraception is recommended while taking this medication and for 4 months after the last dose. Your care team can help you find the option that works for you. Do not breastfeed while taking this medication and for 4 months after the last dose. What side effects may I notice from receiving this medication? Side effects that you should report to your care team as soon as possible: Allergic reactions--skin rash, itching, hives, swelling of the face, lips, tongue, or throat Dry cough, shortness of breath or trouble breathing Eye pain, redness, irritation, or discharge with blurry or decreased vision Heart muscle inflammation--unusual weakness or fatigue, shortness of breath, chest pain, fast or irregular heartbeat, dizziness, swelling of the ankles, feet, or hands Hormone gland problems--headache, sensitivity to light, unusual weakness or fatigue, dizziness, fast or irregular heartbeat, increased sensitivity to cold or heat, excessive sweating, constipation, hair loss, increased thirst or amount of urine, tremors or shaking,  irritability Infusion reactions--chest pain, shortness of breath or trouble breathing, feeling faint or lightheaded Kidney injury (glomerulonephritis)--decrease in the amount of urine, red or dark brown urine, foamy or bubbly urine, swelling of the ankles, hands, or feet Liver injury--right upper belly pain, loss of appetite, nausea, light-colored stool, dark yellow or brown urine, yellowing skin or eyes, unusual weakness or fatigue Pain, tingling, or numbness in the hands or feet, muscle weakness, change in  vision, confusion or trouble speaking, loss of balance or coordination, trouble walking, seizures Rash, fever, and swollen lymph nodes Redness, blistering, peeling, or loosening of the skin, including inside the mouth Sudden or severe stomach pain, bloody diarrhea, fever, nausea, vomiting Side effects that usually do not require medical attention (report to your care team if they continue or are bothersome): Bone, joint, or muscle pain Diarrhea Fatigue Loss of appetite Nausea Skin rash This list may not describe all possible side effects. Call your doctor for medical advice about side effects. You may report side effects to FDA at 1-800-FDA-1088. Where should I keep my medication? This medication is given in a hospital or clinic. It will not be stored at home. NOTE: This sheet is a summary. It may not cover all possible information. If you have questions about this medicine, talk to your doctor, pharmacist, or health care provider.  2023 Elsevier/Gold Standard (2013-03-03 00:00:00)  Trastuzumab Injection What is this medication? TRASTUZUMAB (tras TOO zoo mab) treats breast cancer and stomach cancer. It works by blocking a protein that causes cancer cells to grow and multiply. This helps to slow or stop the spread of cancer cells. This medicine may be used for other purposes; ask your health care provider or pharmacist if you have questions. COMMON BRAND NAME(S): Herceptin, Janae Bridgeman, Ontruzant, Trazimera What should I tell my care team before I take this medication? They need to know if you have any of these conditions: Heart failure Lung disease An unusual or allergic reaction to trastuzumab, other medications, foods, dyes, or preservatives Pregnant or trying to get pregnant Breast-feeding How should I use this medication? This medication is injected into a vein. It is given by your care team in a hospital or clinic setting. Talk to your care team about the use of  this medication in children. It is not approved for use in children. Overdosage: If you think you have taken too much of this medicine contact a poison control center or emergency room at once. NOTE: This medicine is only for you. Do not share this medicine with others. What if I miss a dose? Keep appointments for follow-up doses. It is important not to miss your dose. Call your care team if you are unable to keep an appointment. What may interact with this medication? Certain types of chemotherapy, such as daunorubicin, doxorubicin, epirubicin, idarubicin This list may not describe all possible interactions. Give your health care provider a list of all the medicines, herbs, non-prescription drugs, or dietary supplements you use. Also tell them if you smoke, drink alcohol, or use illegal drugs. Some items may interact with your medicine. What should I watch for while using this medication? Your condition will be monitored carefully while you are receiving this medication. This medication may make you feel generally unwell. This is not uncommon, as chemotherapy affects healthy cells as well as cancer cells. Report any side effects. Continue your course of treatment even though you feel ill unless your care team tells you to stop. This medication may increase your risk  of getting an infection. Call your care team for advice if you get a fever, chills, sore throat, or other symptoms of a cold or flu. Do not treat yourself. Try to avoid being around people who are sick. Avoid taking medications that contain aspirin, acetaminophen, ibuprofen, naproxen, or ketoprofen unless instructed by your care team. These medications can hide a fever. Talk to your care team if you may be pregnant. Serious birth defects can occur if you take this medication during pregnancy and for 7 months after the last dose. You will need a negative pregnancy test before starting this medication. Contraception is recommended while  taking this medication and for 7 months after the last dose. Your care team can help you find the option that works for you. Do not breastfeed while taking this medication and for 7 months after stopping treatment. What side effects may I notice from receiving this medication? Side effects that you should report to your care team as soon as possible: Allergic reactions or angioedema--skin rash, itching or hives, swelling of the face, eyes, lips, tongue, arms, or legs, trouble swallowing or breathing Dry cough, shortness of breath or trouble breathing Heart failure--shortness of breath, swelling of the ankles, feet, or hands, sudden weight gain, unusual weakness or fatigue Infection--fever, chills, cough, or sore throat Infusion reactions--chest pain, shortness of breath or trouble breathing, feeling faint or lightheaded Side effects that usually do not require medical attention (report to your care team if they continue or are bothersome): Diarrhea Dizziness Headache Nausea Trouble sleeping Vomiting This list may not describe all possible side effects. Call your doctor for medical advice about side effects. You may report side effects to FDA at 1-800-FDA-1088. Where should I keep my medication? This medication is given in a hospital or clinic. It will not be stored at home. NOTE: This sheet is a summary. It may not cover all possible information. If you have questions about this medicine, talk to your doctor, pharmacist, or health care provider.  2023 Elsevier/Gold Standard (2021-10-13 00:00:00)  Oxaliplatin Injection What is this medication? OXALIPLATIN (ox AL i PLA tin) treats some types of cancer. It works by slowing down the growth of cancer cells. This medicine may be used for other purposes; ask your health care provider or pharmacist if you have questions. COMMON BRAND NAME(S): Eloxatin What should I tell my care team before I take this medication? They need to know if you have  any of these conditions: Heart disease History of irregular heartbeat or rhythm Liver disease Low blood cell levels (white cells, red cells, and platelets) Lung or breathing disease, such as asthma Take medications that treat or prevent blood clots Tingling of the fingers, toes, or other nerve disorder An unusual or allergic reaction to oxaliplatin, other medications, foods, dyes, or preservatives If you or your partner are pregnant or trying to get pregnant Breast-feeding How should I use this medication? This medication is injected into a vein. It is given by your care team in a hospital or clinic setting. Talk to your care team about the use of this medication in children. Special care may be needed. Overdosage: If you think you have taken too much of this medicine contact a poison control center or emergency room at once. NOTE: This medicine is only for you. Do not share this medicine with others. What if I miss a dose? Keep appointments for follow-up doses. It is important not to miss a dose. Call your care team if you are unable  to keep an appointment. What may interact with this medication? Do not take this medication with any of the following: Cisapride Dronedarone Pimozide Thioridazine This medication may also interact with the following: Aspirin and aspirin-like medications Certain medications that treat or prevent blood clots, such as warfarin, apixaban, dabigatran, and rivaroxaban Cisplatin Cyclosporine Diuretics Medications for infection, such as acyclovir, adefovir, amphotericin B, bacitracin, cidofovir, foscarnet, ganciclovir, gentamicin, pentamidine, vancomycin NSAIDs, medications for pain and inflammation, such as ibuprofen or naproxen Other medications that cause heart rhythm changes Pamidronate Zoledronic acid This list may not describe all possible interactions. Give your health care provider a list of all the medicines, herbs, non-prescription drugs, or dietary  supplements you use. Also tell them if you smoke, drink alcohol, or use illegal drugs. Some items may interact with your medicine. What should I watch for while using this medication? Your condition will be monitored carefully while you are receiving this medication. You may need blood work while taking this medication. This medication may make you feel generally unwell. This is not uncommon as chemotherapy can affect healthy cells as well as cancer cells. Report any side effects. Continue your course of treatment even though you feel ill unless your care team tells you to stop. This medication may increase your risk of getting an infection. Call your care team for advice if you get a fever, chills, sore throat, or other symptoms of a cold or flu. Do not treat yourself. Try to avoid being around people who are sick. Avoid taking medications that contain aspirin, acetaminophen, ibuprofen, naproxen, or ketoprofen unless instructed by your care team. These medications may hide a fever. Be careful brushing or flossing your teeth or using a toothpick because you may get an infection or bleed more easily. If you have any dental work done, tell your dentist you are receiving this medication. This medication can make you more sensitive to cold. Do not drink cold drinks or use ice. Cover exposed skin before coming in contact with cold temperatures or cold objects. When out in cold weather wear warm clothing and cover your mouth and nose to warm the air that goes into your lungs. Tell your care team if you get sensitive to the cold. Talk to your care team if you or your partner are pregnant or think either of you might be pregnant. This medication can cause serious birth defects if taken during pregnancy and for 9 months after the last dose. A negative pregnancy test is required before starting this medication. A reliable form of contraception is recommended while taking this medication and for 9 months after the  last dose. Talk to your care team about effective forms of contraception. Do not father a child while taking this medication and for 6 months after the last dose. Use a condom while having sex during this time period. Do not breastfeed while taking this medication and for 3 months after the last dose. This medication may cause infertility. Talk to your care team if you are concerned about your fertility. What side effects may I notice from receiving this medication? Side effects that you should report to your care team as soon as possible: Allergic reactions--skin rash, itching, hives, swelling of the face, lips, tongue, or throat Bleeding--bloody or black, tar-like stools, vomiting blood or brown material that looks like coffee grounds, red or dark brown urine, small red or purple spots on skin, unusual bruising or bleeding Dry cough, shortness of breath or trouble breathing Heart rhythm changes--fast or irregular  heartbeat, dizziness, feeling faint or lightheaded, chest pain, trouble breathing Infection--fever, chills, cough, sore throat, wounds that don't heal, pain or trouble when passing urine, general feeling of discomfort or being unwell Liver injury--right upper belly pain, loss of appetite, nausea, light-colored stool, dark yellow or brown urine, yellowing skin or eyes, unusual weakness or fatigue Low red blood cell level--unusual weakness or fatigue, dizziness, headache, trouble breathing Muscle injury--unusual weakness or fatigue, muscle pain, dark yellow or brown urine, decrease in amount of urine Pain, tingling, or numbness in the hands or feet Sudden and severe headache, confusion, change in vision, seizures, which may be signs of posterior reversible encephalopathy syndrome (PRES) Unusual bruising or bleeding Side effects that usually do not require medical attention (report to your care team if they continue or are bothersome): Diarrhea Nausea Pain, redness, or swelling with sores  inside the mouth or throat Unusual weakness or fatigue Vomiting This list may not describe all possible side effects. Call your doctor for medical advice about side effects. You may report side effects to FDA at 1-800-FDA-1088. Where should I keep my medication? This medication is given in a hospital or clinic. It will not be stored at home. NOTE: This sheet is a summary. It may not cover all possible information. If you have questions about this medicine, talk to your doctor, pharmacist, or health care provider.  2023 Elsevier/Gold Standard (2007-08-03 00:00:00)  Potassium Chloride Injection What is this medication? POTASSIUM CHLORIDE (poe TASS i um KLOOR ide) prevents and treats low levels of potassium in your body. Potassium plays an important role in maintaining the health of your kidneys, heart, muscles, and nervous system. This medicine may be used for other purposes; ask your health care provider or pharmacist if you have questions. COMMON BRAND NAME(S): PROAMP What should I tell my care team before I take this medication? They need to know if you have any of these conditions: [name] disease Dehydration Diabetes (high blood sugar) Heart disease High levels of potassium in the blood Irregular heartbeat or rhythm Kidney disease Large areas of burned skin An unusual or allergic reaction to potassium, other medications, foods, dyes, or preservatives Pregnant or trying to get pregnant Breast-feeding How should I use this medication? This medication is injected into a vein. It is given in a hospital or clinic setting. Talk to your care team about the use of this medication in children. Special care may be needed. Overdosage: If you think you have taken too much of this medicine contact a poison control center or emergency room at once. NOTE: This medicine is only for you. Do not share this medicine with others. What if I miss a dose? This does not apply. This medication is not for  regular use. What may interact with this medication? Do not take this medication with any of the following: Certain diuretics, such as spironolactone, triamterene Eplerenone Sodium polystyrene sulfonate This medication may also interact with the following: Certain medications for blood pressure or heart disease, such as lisinopril, losartan, quinapril, valsartan Medications that lower your chance of fighting infection, such as cyclosporine, tacrolimus NSAIDs, medications for pain and inflammation, such as ibuprofen or naproxen Other potassium supplements Salt substitutes This list may not describe all possible interactions. Give your health care provider a list of all the medicines, herbs, non-prescription drugs, or dietary supplements you use. Also tell them if you smoke, drink alcohol, or use illegal drugs. Some items may interact with your medicine. What should I watch for while using this  medication? Visit your care team for regular checks on your progress. Tell your care team if your symptoms do not start to get better or if they get worse. You may need blood work while you are taking this medication. Avoid salt substitutes unless you are told otherwise by your care team. What side effects may I notice from receiving this medication? Side effects that you should report to your care team as soon as possible: Allergic reactions--skin rash, itching, hives, swelling of the face, lips, tongue, or throat High potassium level--muscle weakness, fast or irregular heartbeat Side effects that usually do not require medical attention (report to your care team if they continue or are bothersome): Diarrhea Nausea Stomach pain Vomiting This list may not describe all possible side effects. Call your doctor for medical advice about side effects. You may report side effects to FDA at 1-800-FDA-1088. Where should I keep my medication? This medication is given in a hospital or clinic. It will not be  stored at home. NOTE: This sheet is a summary. It may not cover all possible information. If you have questions about this medicine, talk to your doctor, pharmacist, or health care provider.  2023 Elsevier/Gold Standard (2020-09-23 00:00:00)

## 2022-06-12 NOTE — Progress Notes (Signed)
Patient seen by Ned Card NP today  Vitals are within treatment parameters.  Labs reviewed by Ned Card NP and are not all within treatment parameters. K + is 2.8. 20 meq of K + IV will add today with treatment  Per physician team, patient is ready for treatment and there are NO modifications to the treatment plan.

## 2022-06-13 ENCOUNTER — Other Ambulatory Visit: Payer: Self-pay

## 2022-06-13 ENCOUNTER — Telehealth: Payer: Self-pay

## 2022-06-13 DIAGNOSIS — E876 Hypokalemia: Secondary | ICD-10-CM

## 2022-06-13 LAB — T4: T4, Total: 9.1 ug/dL (ref 4.5–12.0)

## 2022-06-13 MED ORDER — MAGNESIUM SULFATE 2 GM/50ML IV SOLN: 2.0000 g | Freq: Once | INTRAVENOUS | Status: DC

## 2022-06-13 NOTE — Telephone Encounter (Signed)
Patient is schedule to come in

## 2022-06-13 NOTE — Telephone Encounter (Signed)
Patient is schedule to come in 06/14/22 at 930 for 2g of magnesium.

## 2022-06-13 NOTE — Telephone Encounter (Signed)
-----   Message from Owens Shark, NP sent at 06/13/2022  8:17 AM EST ----- Please let him know magnesium level is low.  Can he come in for 2 g of IV magnesium today or tomorrow?

## 2022-06-14 ENCOUNTER — Telehealth: Payer: Self-pay | Admitting: Student

## 2022-06-14 ENCOUNTER — Inpatient Hospital Stay: Payer: Medicare Other

## 2022-06-14 ENCOUNTER — Telehealth: Payer: Self-pay | Admitting: *Deleted

## 2022-06-14 VITALS — BP 110/79 | HR 74 | Temp 98.0°F | Resp 18

## 2022-06-14 DIAGNOSIS — S0240CD Maxillary fracture, right side, subsequent encounter for fracture with routine healing: Secondary | ICD-10-CM | POA: Diagnosis not present

## 2022-06-14 DIAGNOSIS — Z23 Encounter for immunization: Secondary | ICD-10-CM | POA: Diagnosis not present

## 2022-06-14 DIAGNOSIS — C801 Malignant (primary) neoplasm, unspecified: Secondary | ICD-10-CM | POA: Diagnosis not present

## 2022-06-14 DIAGNOSIS — Z95828 Presence of other vascular implants and grafts: Secondary | ICD-10-CM

## 2022-06-14 DIAGNOSIS — M069 Rheumatoid arthritis, unspecified: Secondary | ICD-10-CM | POA: Diagnosis not present

## 2022-06-14 DIAGNOSIS — Z86711 Personal history of pulmonary embolism: Secondary | ICD-10-CM | POA: Diagnosis not present

## 2022-06-14 DIAGNOSIS — S02841D Fracture of lateral orbital wall, right side, subsequent encounter for fracture with routine healing: Secondary | ICD-10-CM | POA: Diagnosis not present

## 2022-06-14 DIAGNOSIS — C787 Secondary malignant neoplasm of liver and intrahepatic bile duct: Secondary | ICD-10-CM | POA: Diagnosis not present

## 2022-06-14 DIAGNOSIS — C159 Malignant neoplasm of esophagus, unspecified: Secondary | ICD-10-CM

## 2022-06-14 DIAGNOSIS — S0240ED Zygomatic fracture, right side, subsequent encounter for fracture with routine healing: Secondary | ICD-10-CM | POA: Diagnosis not present

## 2022-06-14 DIAGNOSIS — Z86718 Personal history of other venous thrombosis and embolism: Secondary | ICD-10-CM | POA: Diagnosis not present

## 2022-06-14 DIAGNOSIS — Z5111 Encounter for antineoplastic chemotherapy: Secondary | ICD-10-CM | POA: Diagnosis not present

## 2022-06-14 DIAGNOSIS — K2289 Other specified disease of esophagus: Secondary | ICD-10-CM | POA: Diagnosis not present

## 2022-06-14 DIAGNOSIS — Z7901 Long term (current) use of anticoagulants: Secondary | ICD-10-CM | POA: Diagnosis not present

## 2022-06-14 DIAGNOSIS — I252 Old myocardial infarction: Secondary | ICD-10-CM | POA: Diagnosis not present

## 2022-06-14 DIAGNOSIS — R970 Elevated carcinoembryonic antigen [CEA]: Secondary | ICD-10-CM | POA: Diagnosis not present

## 2022-06-14 DIAGNOSIS — S0231XD Fracture of orbital floor, right side, subsequent encounter for fracture with routine healing: Secondary | ICD-10-CM | POA: Diagnosis not present

## 2022-06-14 DIAGNOSIS — I517 Cardiomegaly: Secondary | ICD-10-CM | POA: Diagnosis not present

## 2022-06-14 DIAGNOSIS — Z79899 Other long term (current) drug therapy: Secondary | ICD-10-CM | POA: Diagnosis not present

## 2022-06-14 MED ORDER — SODIUM CHLORIDE 0.9% FLUSH
10.0000 mL | INTRAVENOUS | Status: AC | PRN
  Administered 2022-06-14: 10 mL

## 2022-06-14 MED ORDER — HEPARIN SOD (PORK) LOCK FLUSH 100 UNIT/ML IV SOLN
500.0000 [IU] | INTRAVENOUS | Status: AC | PRN
  Administered 2022-06-14: 500 [IU]

## 2022-06-14 MED ORDER — MAGNESIUM SULFATE 2 GM/50ML IV SOLN
2.0000 g | Freq: Once | INTRAVENOUS | Status: AC
  Administered 2022-06-14: 2 g via INTRAVENOUS
  Filled 2022-06-14: qty 50

## 2022-06-14 MED ORDER — SODIUM CHLORIDE 0.9 % IV SOLN: INTRAVENOUS | Status: DC

## 2022-06-14 NOTE — Telephone Encounter (Signed)
[facility] PT Maye Hides requesting VO.  Please call back [phone]

## 2022-06-14 NOTE — Telephone Encounter (Signed)
RTC to [employer] Order given for request for PT x1 week x1 per ok of Dr Markus Jarvis.

## 2022-06-14 NOTE — Telephone Encounter (Signed)
RTC to [place], PT with [facility] .  Would like to get verbal orders to extend PT 1 time a week for 2 weeks.  Patient is currently undergoing Chemo.  Needs for Strengthening, Balance and Gait training.  Liji cam be reached at [phone].  Can leave message on secure line.

## 2022-06-14 NOTE — Patient Instructions (Signed)
Magnesium Sulfate Injection What is this medication? MAGNESIUM SULFATE (mag NEE zee um SUL fate) prevents and treats low levels of magnesium in your body. It may also be used to prevent and treat seizures during pregnancy in people with high blood pressure disorders, such as preeclampsia or eclampsia. Magnesium plays an important role in maintaining the health of your muscles and nervous system. This medicine may be used for other purposes; ask your health care provider or pharmacist if you have questions. What should I tell my care team before I take this medication? They need to know if you have any of these conditions: Heart disease History of irregular heart beat Kidney disease An unusual or allergic reaction to magnesium sulfate, medications, foods, dyes, or preservatives Pregnant or trying to get pregnant Breast-feeding How should I use this medication? This medication is for infusion into a vein. It is given in a hospital or clinic setting. Talk to your care team about the use of this medication in children. While this medication may be prescribed for selected conditions, precautions do apply. Overdosage: If you think you have taken too much of this medicine contact a poison control center or emergency room at once. NOTE: This medicine is only for you. Do not share this medicine with others. What if I miss a dose? This does not apply. What may interact with this medication? Certain medications for anxiety or sleep Certain medications for seizures, such phenobarbital Digoxin Medications that relax muscles for surgery Narcotic medications for pain This list may not describe all possible interactions. Give your health care provider a list of all the medicines, herbs, non-prescription drugs, or dietary supplements you use. Also tell them if you smoke, drink alcohol, or use illegal drugs. Some items may interact with your medicine. What should I watch for while using this  medication? Your condition will be monitored carefully while you are receiving this medication. You may need blood work done while you are receiving this medication. What side effects may I notice from receiving this medication? Side effects that you should report to your care team as soon as possible: Allergic reactions--skin rash, itching, hives, swelling of the face, lips, tongue, or throat High magnesium level--confusion, drowsiness, facial flushing, redness, sweating, muscle weakness, fast or irregular heartbeat, trouble breathing Low blood pressure--dizziness, feeling faint or lightheaded, blurry vision Side effects that usually do not require medical attention (report to your care team if they continue or are bothersome): Headache Nausea This list may not describe all possible side effects. Call your doctor for medical advice about side effects. You may report side effects to FDA at 1-800-FDA-1088. Where should I keep my medication? This medication is given in a hospital or clinic and will not be stored at home. NOTE: This sheet is a summary. It may not cover all possible information. If you have questions about this medicine, talk to your doctor, pharmacist, or health care provider.  2023 Elsevier/Gold Standard (2012-10-18 00:00:00)  

## 2022-06-14 NOTE — Telephone Encounter (Signed)
RTC to [place], [place] with [employer] .  Verbal order given for requested OT for patient per Dr. Markus Jarvis.

## 2022-06-14 NOTE — Telephone Encounter (Signed)
Call from [place] requesting verbal order for OT for patient .  1 time a week for 1 week.  ADL's ,Therapy to Exercises and Therapeutic Activity.

## 2022-06-15 ENCOUNTER — Ambulatory Visit (INDEPENDENT_AMBULATORY_CARE_PROVIDER_SITE_OTHER): Payer: Medicare Other

## 2022-06-15 VITALS — BP 113/76 | HR 90 | Temp 97.6°F

## 2022-06-15 DIAGNOSIS — R0600 Dyspnea, unspecified: Secondary | ICD-10-CM

## 2022-06-15 DIAGNOSIS — E876 Hypokalemia: Secondary | ICD-10-CM

## 2022-06-15 DIAGNOSIS — Z87891 Personal history of nicotine dependence: Secondary | ICD-10-CM | POA: Diagnosis not present

## 2022-06-15 DIAGNOSIS — M0579 Rheumatoid arthritis with rheumatoid factor of multiple sites without organ or systems involvement: Secondary | ICD-10-CM

## 2022-06-15 DIAGNOSIS — I10 Essential (primary) hypertension: Secondary | ICD-10-CM

## 2022-06-15 DIAGNOSIS — E785 Hyperlipidemia, unspecified: Secondary | ICD-10-CM | POA: Diagnosis not present

## 2022-06-15 DIAGNOSIS — D649 Anemia, unspecified: Secondary | ICD-10-CM

## 2022-06-15 DIAGNOSIS — I739 Peripheral vascular disease, unspecified: Secondary | ICD-10-CM

## 2022-06-15 MED ORDER — AMLODIPINE BESYLATE 10 MG PO TABS: 10.0000 mg | ORAL_TABLET | Freq: Every day | ORAL | 3 refills | Status: DC

## 2022-06-15 MED ORDER — ONDANSETRON HCL 8 MG PO TABS: ORAL_TABLET | ORAL | 0 refills | Status: DC

## 2022-06-15 MED ORDER — PROCHLORPERAZINE MALEATE 10 MG PO TABS: 10.0000 mg | ORAL_TABLET | Freq: Three times a day (TID) | ORAL | 0 refills | Status: DC | PRN

## 2022-06-15 MED ORDER — ISOSORBIDE MONONITRATE ER 30 MG PO TB24: 30.0000 mg | ORAL_TABLET | Freq: Every day | ORAL | 0 refills | Status: DC

## 2022-06-15 MED ORDER — ATORVASTATIN CALCIUM 40 MG PO TABS: 40.0000 mg | ORAL_TABLET | Freq: Every day | ORAL | 3 refills | Status: DC

## 2022-06-15 MED ORDER — GABAPENTIN 100 MG PO CAPS: 100.0000 mg | ORAL_CAPSULE | ORAL | 0 refills | Status: DC

## 2022-06-15 MED ORDER — METOPROLOL SUCCINATE ER 25 MG PO TB24: 25.0000 mg | ORAL_TABLET | Freq: Every day | ORAL | 3 refills | Status: DC

## 2022-06-15 MED ORDER — METHOTREXATE SODIUM 7.5 MG PO TABS: 7.5000 mg | ORAL_TABLET | ORAL | 3 refills | Status: DC

## 2022-06-15 MED ORDER — CLOPIDOGREL BISULFATE 75 MG PO TABS: 75.0000 mg | ORAL_TABLET | Freq: Every day | ORAL | 3 refills | Status: DC

## 2022-06-15 MED ORDER — ALBUTEROL SULFATE HFA 108 (90 BASE) MCG/ACT IN AERS: 2.0000 | INHALATION_SPRAY | Freq: Four times a day (QID) | RESPIRATORY_TRACT | 2 refills | Status: DC | PRN

## 2022-06-15 NOTE — Assessment & Plan Note (Signed)
Patient describes occasional dyspnea which has been going on chronically.  Sometimes it happens when he lays flat but also can happen when he is walking around and exerting himself.  No cough, no hemoptysis, no fever or chills.  He did have abnormal PFTs almost 10 years ago but he mentions no one has ever diagnosed him with any kind of lung disease.  He says he has never been treated with any inhalers for COPD or asthma.  His dyspnea is mild and typically spontaneously resolves.  Considered reobtaining PFTs for prognostication, but overall want to go ahead and treat symptomatically with albuterol as needed.  Instructed him on how to use this inhaler.  Can monitor at next office visit and add LAMA if needed for symptomatic management.

## 2022-06-15 NOTE — Progress Notes (Signed)
CC: Routine follow-up  HPI:  [name] is a 61 y.o.-year-old male with past medical history as below presenting for routine follow-up.  Please see encounters tab for problem-based charting.  Past Medical History:  Diagnosis Date   Abnormal CT scan, gastrointestinal tract    Acute respiratory failure ([place]) 01/31/2022   Acute respiratory failure with hypoxemia ([facility]) 02/11/2022   Anemia    Anemia 08/06/2012   Aortic valve mass 12/30/2015   Bilateral shoulder pain 08/20/2012   Bite wound of left hand 09/27/2021   Cardiogenic shock ([place]) 01/31/2022   Community acquired pneumonia 11/07/2011   Critical limb ischemia with history of revascularization of same extremity ([place]) 04/28/2020   DDD (degenerative disc disease), lumbosacral     and grade 2 slip   DVT (deep venous thrombosis) ([place]) 01/31/2022   GERD (gastroesophageal reflux disease)    H/O: stroke 01/31/2022   Heart murmur    History of anemia    no current med.   History of COVID-19 05/10/2019   History of critical lower limb ischemia 02/19/2019   Hyperglycemia 01/31/2022   Hypertension    states is borderline on medication; has been on med. x 5-6 yr.   Hypokalemia 05/30/2018   Impingement syndrome of shoulder region 10/2014   left   Insomnia 06/01/2015   Ischemic ulcer of toe of left foot ([facility])    Lactic acidosis 01/31/2022   Left shoulder pain 12/16/2015   Liver mass    Low back pain without sciatica 10/29/2009   Lupus ([place])    Multiple fractures of ribs, bilateral, due to CPR trauma 01/31/2022   Onychomycosis 01/28/2021   Paronychia of great toe 01/28/2021   Peripheral vascular disease ([place])    Pneumonia    Pressure injury of skin 01/31/2022   Pulmonary contusion 01/31/2022   RA (rheumatoid arthritis) ([place])    Rotator cuff tear 11/04/2014   STEMI (ST elevation myocardial infarction) ([place]) 01/31/2022   Sternal fracture 01/31/2022   Stroke ([place])    Transaminitis 12/16/2015   Review of Systems: As in  HPI.  Please see encounters tab for problem based charting.  Physical Exam:  Vitals:   06/15/22 1350  BP: 113/76  Pulse: 90  Temp: 97.6 F (36.4 C)  TempSrc: Oral  SpO2: 99%   General:Well-appearing, pleasant, In NAD Cardiac: RRR, no murmurs rubs or gallops. Respiratory: Normal work of breathing on room air, CTAB Abdominal: Soft, nontender, nondistended Extremities: Left AKA.  No lower extremity pitting edema  Assessment & Plan:   HTN (hypertension) Metoprolol succinate 25 mg daily, Imdur 30 mg daily.  Amlodipine 10 and chlorthalidone 25 were added at last visit.  He does have an ischemic cardiac history with prior NSTEMI, cardiac arrest. No chest pain, but does have some dyspnea. Some swelling in his legs. Sometimes has dizziness when going from sitting to standing.   Rheumatoid arthritis ([place]) See prior note about RA history. He was started on methotrexate 7.5, folic acid supplementation, and meloxicam PRN and referred to rheum last visit. Referral is authorized but he has not been contacted yet, will try to expedite. He says methotrexate has helped somewhat with pain but that it is still present. Hopefully he can meet with rheumatology soon for methotrexate titration and monitoring. If his pain worsens at next f\u will consider treating with prednisone for flare.  Will obtain CBC, CMP to assess for sequelae of methotrexate at this visit.  Dyspnea Patient describes occasional dyspnea which has been going on chronically.  Sometimes it happens when he lays flat but also can happen when he is walking around and exerting himself.  No cough, no hemoptysis, no fever or chills.  He did have abnormal PFTs almost 10 years ago but he mentions no one has ever diagnosed him with any kind of lung disease.  He says he has never been treated with any inhalers for COPD or asthma.  His dyspnea is mild and typically spontaneously resolves.  Considered reobtaining PFTs for prognostication, but overall  want to go ahead and treat symptomatically with albuterol as needed.  Instructed him on how to use this inhaler.  Can monitor at next office visit and add LAMA if needed for symptomatic management.  Hypokalemia Had hypokalemia and hypomagnesemia and recent chemotherapy session.  He was repleted during the session and prescribed potassium, but he says he has not been able to get those since that point.  Will recheck K and mag today and replete as necessary.   Patient seen with Dr. Evette Doffing

## 2022-06-15 NOTE — Assessment & Plan Note (Addendum)
See prior note about RA history. He was started on methotrexate 7.5, folic acid supplementation, and meloxicam PRN and referred to rheum last visit. Referral is authorized but he has not been contacted yet, will try to expedite. He says methotrexate has helped somewhat with pain but that it is still present. Hopefully he can meet with rheumatology soon for methotrexate titration and monitoring. If his pain worsens at next f\u will consider treating with prednisone for flare.  Will obtain CBC, CMP to assess for sequelae of methotrexate at this visit.

## 2022-06-15 NOTE — Telephone Encounter (Signed)
Received call from [place], [place] with [facility]. Requesting VO for HH OT 1 week 1 to work on therapeutic exercises, activity, and functional transfers to toilet and shower chair. Verbal auth given.

## 2022-06-15 NOTE — Assessment & Plan Note (Addendum)
Metoprolol succinate 25 mg daily, Imdur 30 mg daily.  Amlodipine 10 and chlorthalidone 25 were added at last visit.  He does have an ischemic cardiac history with prior NSTEMI, cardiac arrest. No chest pain, but does have some dyspnea. Some swelling in his legs. Sometimes has dizziness when going from sitting to standing.

## 2022-06-15 NOTE — Assessment & Plan Note (Signed)
Had hypokalemia and hypomagnesemia and recent chemotherapy session.  He was repleted during the session and prescribed potassium, but he says he has not been able to get those since that point.  Will recheck K and mag today and replete as necessary.

## 2022-06-15 NOTE — Patient Instructions (Addendum)
[name], it was a pleasure seeing you today! You endorsed feeling well today. Below are some of the things we talked about this visit. We look forward to seeing you in the follow up appointment!  Today we discussed: Blood pressure: I am stopping your chlorthalidone. Give [place] a call if you have worsening chest pain, trouble breathing, or dizziness  Trouble breathing: I am prescribing albuterol which you can take every 6 hours as needed for trouble breathing  [facility]  Arthritis: I refilled your methotrexate and will let you know about the rheumatology referral    I have ordered the following labs today:  Lab Orders         CMP14 + Anion Gap         Magnesium         CBC with Diff        Referrals ordered today:   Referral Orders  No referral(s) requested today     I have ordered the following medication/changed the following medications:   Stop the following medications: Medications Discontinued During This Encounter  Medication Reason   chlorthalidone (HYGROTON) 25 MG tablet Discontinued by provider   gabapentin (NEURONTIN) 100 MG capsule Reorder   prochlorperazine (COMPAZINE) 10 MG tablet Reorder   amLODipine (NORVASC) 10 MG tablet Reorder   clopidogrel (PLAVIX) 75 MG tablet Reorder   methotrexate (RHEUMATREX) 7.5 MG tablet Reorder   isosorbide mononitrate (IMDUR) 30 MG 24 hr tablet Reorder   ondansetron (ZOFRAN) 8 MG tablet Reorder   atorvastatin (LIPITOR) 40 MG tablet Reorder   metoprolol succinate (TOPROL-XL) 25 MG 24 hr tablet Reorder     Start the following medications: Meds ordered this encounter  Medications   methotrexate (RHEUMATREX) 7.5 MG tablet    Sig: Take 1 tablet (7.5 mg total) by mouth once a week. Caution:Chemotherapy. Protect from light.    Dispense:  4 tablet    Refill:  3   amLODipine (NORVASC) 10 MG tablet    Sig: Take 1 tablet (10 mg total) by mouth daily.    Dispense:  90 tablet     Refill:  3   atorvastatin (LIPITOR) 40 MG tablet    Sig: Take 1 tablet (40 mg total) by mouth daily at 6 PM.    Dispense:  90 tablet    Refill:  3   clopidogrel (PLAVIX) 75 MG tablet    Sig: Take 1 tablet (75 mg total) by mouth daily.    Dispense:  90 tablet    Refill:  3   gabapentin (NEURONTIN) 100 MG capsule    Sig: Take 1 capsule (100 mg total) by mouth See admin instructions. Daily or twice daily if tolerated.    Dispense:  90 capsule    Refill:  0   isosorbide mononitrate (IMDUR) 30 MG 24 hr tablet    Sig: Take 1 tablet (30 mg total) by mouth daily.    Dispense:  90 tablet    Refill:  0   metoprolol succinate (TOPROL-XL) 25 MG 24 hr tablet    Sig: Take 1 tablet (25 mg total) by mouth daily.    Dispense:  90 tablet    Refill:  3   ondansetron (ZOFRAN) 8 MG tablet    Sig: TAKE 1 TABLET(8 MG) BY MOUTH EVERY 8 HOURS AS NEEDED FOR NAUSEA OR VOMITING    Dispense:  20 tablet    Refill:  0   prochlorperazine (COMPAZINE) 10 MG  tablet    Sig: Take 1 tablet (10 mg total) by mouth every 8 (eight) hours as needed for refractory nausea / vomiting. TAKE 1 TABLET(10 MG) BY MOUTH EVERY 6 HOURS AS NEEDED FOR NAUSEA OR VOMITING Strength: 10 mg    Dispense:  60 tablet    Refill:  0   albuterol (VENTOLIN HFA) 108 (90 Base) MCG/ACT inhaler    Sig: Inhale 2 puffs into the lungs every 6 (six) hours as needed for wheezing or shortness of breath.    Dispense:  8 g    Refill:  2     Follow-up:  4 weeks    Please make sure to arrive 15 minutes prior to your next appointment. If you arrive late, you may be asked to reschedule.   We look forward to seeing you next time. Please call our clinic at [phone] if you have any questions or concerns. The best time to call is Monday-Friday from 9am-4pm, but there is someone available 24/7. If after hours or the weekend, call the main hospital number and ask for the Internal Medicine Resident On-Call. If you need medication refills, please notify your  pharmacy one week in advance and they will send [place] a request.  Thank you for letting us take part in your care. Wishing you the best!  Thank you, Linus Galas MD

## 2022-06-16 ENCOUNTER — Encounter: Payer: Self-pay | Admitting: Oncology

## 2022-06-16 DIAGNOSIS — C159 Malignant neoplasm of esophagus, unspecified: Secondary | ICD-10-CM | POA: Diagnosis not present

## 2022-06-16 DIAGNOSIS — S02841D Fracture of lateral orbital wall, right side, subsequent encounter for fracture with routine healing: Secondary | ICD-10-CM | POA: Diagnosis not present

## 2022-06-16 DIAGNOSIS — S0240ED Zygomatic fracture, right side, subsequent encounter for fracture with routine healing: Secondary | ICD-10-CM | POA: Diagnosis not present

## 2022-06-16 DIAGNOSIS — S0240CD Maxillary fracture, right side, subsequent encounter for fracture with routine healing: Secondary | ICD-10-CM | POA: Diagnosis not present

## 2022-06-16 DIAGNOSIS — S0231XD Fracture of orbital floor, right side, subsequent encounter for fracture with routine healing: Secondary | ICD-10-CM | POA: Diagnosis not present

## 2022-06-16 DIAGNOSIS — M069 Rheumatoid arthritis, unspecified: Secondary | ICD-10-CM | POA: Diagnosis not present

## 2022-06-16 LAB — CBC WITH DIFFERENTIAL/PLATELET
Basophils Absolute: 0.1 10*3/uL (ref 0.0–0.2)
Basos: 1 %
EOS (ABSOLUTE): 0.2 10*3/uL (ref 0.0–0.4)
Eos: 4 %
Hematocrit: 36.1 % — ABNORMAL LOW (ref 37.5–51.0)
Hemoglobin: 11.4 g/dL — ABNORMAL LOW (ref 13.0–17.7)
Immature Grans (Abs): 0 10*3/uL (ref 0.0–0.1)
Immature Granulocytes: 0 %
Lymphocytes Absolute: 1.1 10*3/uL (ref 0.7–3.1)
Lymphs: 26 %
MCH: 26.6 pg (ref 26.6–33.0)
MCHC: 31.6 g/dL (ref 31.5–35.7)
MCV: 84 fL (ref 79–97)
Monocytes Absolute: 0.6 10*3/uL (ref 0.1–0.9)
Monocytes: 14 %
Neutrophils Absolute: 2.3 10*3/uL (ref 1.4–7.0)
Neutrophils: 55 %
Platelets: 133 10*3/uL — ABNORMAL LOW (ref 150–450)
RBC: 4.28 x10E6/uL (ref 4.14–5.80)
RDW: 19.4 % — ABNORMAL HIGH (ref 11.6–15.4)
WBC: 4.1 10*3/uL (ref 3.4–10.8)

## 2022-06-16 LAB — CMP14 + ANION GAP
ALT: 47 IU/L — ABNORMAL HIGH (ref 0–44)
AST: 70 IU/L — ABNORMAL HIGH (ref 0–40)
Albumin/Globulin Ratio: 0.9 — ABNORMAL LOW (ref 1.2–2.2)
Albumin: 3.3 g/dL — ABNORMAL LOW (ref 3.9–4.9)
Alkaline Phosphatase: 220 IU/L — ABNORMAL HIGH (ref 44–121)
Anion Gap: 13 mmol/L (ref 10.0–18.0)
BUN/Creatinine Ratio: 29 — ABNORMAL HIGH (ref 10–24)
BUN: 20 mg/dL (ref 8–27)
Bilirubin Total: 0.2 mg/dL (ref 0.0–1.2)
CO2: 25 mmol/L (ref 20–29)
Calcium: 8.9 mg/dL (ref 8.6–10.2)
Chloride: 102 mmol/L (ref 96–106)
Creatinine, Ser: 0.7 mg/dL — ABNORMAL LOW (ref 0.76–1.27)
Globulin, Total: 3.6 g/dL (ref 1.5–4.5)
Glucose: 103 mg/dL — ABNORMAL HIGH (ref 70–99)
Potassium: 3.6 mmol/L (ref 3.5–5.2)
Sodium: 140 mmol/L (ref 134–144)
Total Protein: 6.9 g/dL (ref 6.0–8.5)
eGFR: 104 mL/min/{1.73_m2} (ref >59)

## 2022-06-16 LAB — MAGNESIUM: Magnesium: 2.1 mg/dL (ref 1.6–2.3)

## 2022-06-16 NOTE — Progress Notes (Signed)
Internal Medicine Clinic Attending  I saw and evaluated the patient.  I personally confirmed the key portions of the history and exam documented by Dr. Jodell Cipro and I reviewed pertinent patient test results.  The assessment, diagnosis, and plan were formulated together and I agree with the documentation in the resident's note.   Will discontinue chlorthalidone today due to orthostatic dizziness and increasing frailty. Labs are marginal, will need to recheck in 3 months to monitor for methotrexate-associated changes.

## 2022-06-20 ENCOUNTER — Inpatient Hospital Stay: Payer: Medicare Other

## 2022-06-20 ENCOUNTER — Inpatient Hospital Stay: Payer: Medicare Other | Admitting: Nurse Practitioner

## 2022-06-22 DIAGNOSIS — S0240CD Maxillary fracture, right side, subsequent encounter for fracture with routine healing: Secondary | ICD-10-CM | POA: Diagnosis not present

## 2022-06-22 DIAGNOSIS — S02841D Fracture of lateral orbital wall, right side, subsequent encounter for fracture with routine healing: Secondary | ICD-10-CM | POA: Diagnosis not present

## 2022-06-22 DIAGNOSIS — S0240ED Zygomatic fracture, right side, subsequent encounter for fracture with routine healing: Secondary | ICD-10-CM | POA: Diagnosis not present

## 2022-06-22 DIAGNOSIS — C159 Malignant neoplasm of esophagus, unspecified: Secondary | ICD-10-CM | POA: Diagnosis not present

## 2022-06-22 DIAGNOSIS — S0231XD Fracture of orbital floor, right side, subsequent encounter for fracture with routine healing: Secondary | ICD-10-CM | POA: Diagnosis not present

## 2022-06-22 DIAGNOSIS — M069 Rheumatoid arthritis, unspecified: Secondary | ICD-10-CM | POA: Diagnosis not present

## 2022-06-25 ENCOUNTER — Other Ambulatory Visit: Payer: Self-pay | Admitting: Oncology

## 2022-06-27 ENCOUNTER — Telehealth: Payer: Self-pay | Admitting: *Deleted

## 2022-06-27 ENCOUNTER — Telehealth: Payer: Self-pay | Admitting: Student

## 2022-06-27 NOTE — Telephone Encounter (Signed)
oxyCODONE-acetaminophen (PERCOCET/ROXICET) 5-325 MG tablet     Waller, Bruno (Ph: [phone])

## 2022-06-27 NOTE — Telephone Encounter (Signed)
Call from [place] OT with [facility] calling to inform pt's doctor d/t the holiday , pt's final OT discharge visit has been change from last week to this week.

## 2022-06-27 NOTE — Telephone Encounter (Signed)
Pt's significant other stated pt needs a refill on Oxycodone 10/325 mg not 5/325 mg (which is on his med list). Last rx written 05/27/22 for Oxycodone 10/325 mg. Last OV 06/15/22.

## 2022-06-28 ENCOUNTER — Encounter: Payer: Self-pay | Admitting: Nurse Practitioner

## 2022-06-28 ENCOUNTER — Inpatient Hospital Stay: Payer: 59

## 2022-06-28 ENCOUNTER — Inpatient Hospital Stay: Payer: 59 | Attending: Oncology

## 2022-06-28 ENCOUNTER — Inpatient Hospital Stay (HOSPITAL_BASED_OUTPATIENT_CLINIC_OR_DEPARTMENT_OTHER): Payer: 59 | Admitting: Nurse Practitioner

## 2022-06-28 ENCOUNTER — Inpatient Hospital Stay: Payer: 59 | Admitting: Nutrition

## 2022-06-28 ENCOUNTER — Telehealth: Payer: Self-pay | Admitting: *Deleted

## 2022-06-28 VITALS — BP 138/93 | HR 79

## 2022-06-28 VITALS — BP 114/80 | HR 88 | Temp 98.1°F | Resp 18 | Ht 67.0 in | Wt 139.2 lb

## 2022-06-28 DIAGNOSIS — C801 Malignant (primary) neoplasm, unspecified: Secondary | ICD-10-CM

## 2022-06-28 DIAGNOSIS — C159 Malignant neoplasm of esophagus, unspecified: Secondary | ICD-10-CM

## 2022-06-28 DIAGNOSIS — M0579 Rheumatoid arthritis with rheumatoid factor of multiple sites without organ or systems involvement: Secondary | ICD-10-CM

## 2022-06-28 DIAGNOSIS — I517 Cardiomegaly: Secondary | ICD-10-CM | POA: Insufficient documentation

## 2022-06-28 DIAGNOSIS — J90 Pleural effusion, not elsewhere classified: Secondary | ICD-10-CM | POA: Insufficient documentation

## 2022-06-28 DIAGNOSIS — Z79899 Other long term (current) drug therapy: Secondary | ICD-10-CM | POA: Diagnosis not present

## 2022-06-28 DIAGNOSIS — D509 Iron deficiency anemia, unspecified: Secondary | ICD-10-CM | POA: Diagnosis not present

## 2022-06-28 DIAGNOSIS — C787 Secondary malignant neoplasm of liver and intrahepatic bile duct: Secondary | ICD-10-CM | POA: Diagnosis not present

## 2022-06-28 DIAGNOSIS — Z5111 Encounter for antineoplastic chemotherapy: Secondary | ICD-10-CM | POA: Insufficient documentation

## 2022-06-28 DIAGNOSIS — Z86711 Personal history of pulmonary embolism: Secondary | ICD-10-CM | POA: Diagnosis not present

## 2022-06-28 DIAGNOSIS — J449 Chronic obstructive pulmonary disease, unspecified: Secondary | ICD-10-CM | POA: Insufficient documentation

## 2022-06-28 DIAGNOSIS — I252 Old myocardial infarction: Secondary | ICD-10-CM | POA: Diagnosis not present

## 2022-06-28 LAB — CMP (CANCER CENTER ONLY)
ALT: 20 U/L (ref 10–47)
AST: 33 U/L (ref 11–38)
Albumin: 3.6 g/dL (ref 3.5–5.0)
Alkaline Phosphatase: 178 U/L — ABNORMAL HIGH (ref 38–126)
Anion gap: 10 (ref 5–15)
BUN: 13 mg/dL (ref 8–23)
CO2: 27 mmol/L (ref 22–32)
Calcium: 9.3 mg/dL (ref 8.9–10.3)
Chloride: 103 mmol/L (ref 98–111)
Creatinine: 0.78 mg/dL (ref 0.60–1.20)
GFR, Estimated: >60 mL/min (ref >60)
Glucose, Bld: 99 mg/dL (ref 70–99)
Potassium: 3.1 mmol/L — ABNORMAL LOW (ref 3.5–5.1)
Sodium: 140 mmol/L (ref 135–145)
Total Bilirubin: 0.4 mg/dL (ref 0.2–1.6)
Total Protein: 7.5 g/dL (ref 6.5–8.1)

## 2022-06-28 LAB — CBC WITH DIFFERENTIAL (CANCER CENTER ONLY)
Abs Immature Granulocytes: 0.01 10*3/uL (ref 0.00–0.07)
Basophils Absolute: 0.1 10*3/uL (ref 0.0–0.1)
Basophils Relative: 1 %
Eosinophils Absolute: 0.3 10*3/uL (ref 0.0–0.5)
Eosinophils Relative: 7 %
HCT: 35.1 % — ABNORMAL LOW (ref 39.0–52.0)
Hemoglobin: 11 g/dL — ABNORMAL LOW (ref 13.0–17.0)
Immature Granulocytes: 0 %
Lymphocytes Relative: 26 %
Lymphs Abs: 1.3 10*3/uL (ref 0.7–4.0)
MCH: 26.7 pg (ref 26.0–34.0)
MCHC: 31.3 g/dL (ref 30.0–36.0)
MCV: 85.2 fL (ref 80.0–100.0)
Monocytes Absolute: 0.6 10*3/uL (ref 0.1–1.0)
Monocytes Relative: 13 %
Neutro Abs: 2.5 10*3/uL (ref 1.7–7.7)
Neutrophils Relative %: 53 %
Platelet Count: 124 10*3/uL — ABNORMAL LOW (ref 150–400)
RBC: 4.12 MIL/uL — ABNORMAL LOW (ref 4.22–5.81)
RDW: 21.6 % — ABNORMAL HIGH (ref 11.5–15.5)
WBC Count: 4.8 10*3/uL (ref 4.0–10.5)
nRBC: 0 % (ref 0.0–0.2)

## 2022-06-28 LAB — CEA (ACCESS): CEA (CHCC): 253.66 ng/mL — ABNORMAL HIGH (ref 0.00–5.00)

## 2022-06-28 MED ORDER — SODIUM CHLORIDE 0.9 % IV SOLN
10.0000 mg | Freq: Once | INTRAVENOUS | Status: AC
  Administered 2022-06-28: 10 mg via INTRAVENOUS
  Filled 2022-06-28: qty 10

## 2022-06-28 MED ORDER — DEXTROSE 5 % IV SOLN: Freq: Once | INTRAVENOUS | Status: AC

## 2022-06-28 MED ORDER — SODIUM CHLORIDE 0.9% FLUSH
10.0000 mL | INTRAVENOUS | Status: DC | PRN
  Administered 2022-06-28: 10 mL

## 2022-06-28 MED ORDER — TRASTUZUMAB-ANNS CHEMO 150 MG IV SOLR
4.0000 mg/kg | Freq: Once | INTRAVENOUS | Status: AC
  Administered 2022-06-28: 252 mg via INTRAVENOUS
  Filled 2022-06-28: qty 12

## 2022-06-28 MED ORDER — OXALIPLATIN CHEMO INJECTION 100 MG/20ML
85.0000 mg/m2 | Freq: Once | INTRAVENOUS | Status: AC
  Administered 2022-06-28: 150 mg via INTRAVENOUS
  Filled 2022-06-28: qty 20

## 2022-06-28 MED ORDER — MELOXICAM 7.5 MG PO TABS: 7.5000 mg | ORAL_TABLET | Freq: Every day | ORAL | 0 refills | Status: DC | PRN

## 2022-06-28 MED ORDER — ACETAMINOPHEN 325 MG PO TABS
650.0000 mg | ORAL_TABLET | Freq: Once | ORAL | Status: AC
  Administered 2022-06-28: 650 mg via ORAL
  Filled 2022-06-28: qty 2

## 2022-06-28 MED ORDER — DIPHENHYDRAMINE HCL 25 MG PO CAPS
50.0000 mg | ORAL_CAPSULE | Freq: Once | ORAL | Status: AC
  Administered 2022-06-28: 50 mg via ORAL
  Filled 2022-06-28: qty 2

## 2022-06-28 MED ORDER — SODIUM CHLORIDE 0.9 % IV SOLN: Freq: Once | INTRAVENOUS | Status: AC

## 2022-06-28 MED ORDER — HEPARIN SOD (PORK) LOCK FLUSH 100 UNIT/ML IV SOLN
500.0000 [IU] | Freq: Once | INTRAVENOUS | Status: AC | PRN
  Administered 2022-06-28: 500 [IU]

## 2022-06-28 MED ORDER — PALONOSETRON HCL INJECTION 0.25 MG/5ML
0.2500 mg | Freq: Once | INTRAVENOUS | Status: AC
  Administered 2022-06-28: 0.25 mg via INTRAVENOUS
  Filled 2022-06-28: qty 5

## 2022-06-28 MED ORDER — OXYCODONE-ACETAMINOPHEN 5-325 MG PO TABS: 1.0000 | ORAL_TABLET | Freq: Four times a day (QID) | ORAL | 0 refills | Status: DC | PRN

## 2022-06-28 NOTE — Progress Notes (Signed)
[name] [name] OFFICE PROGRESS NOTE   Diagnosis: Esophagus cancer  INTERVAL HISTORY:   [name] [name] returns as scheduled.  He completed a cycle of oxaliplatin, trastuzumab and Pembrolizumab 06/12/2022.  He has occasional nausea.  No mouth sores.  No diarrhea.  Mild intermittent numbness in the fingertips.  No dysphagia.  Tolerating a regular diet.  He notes a dry rash on his face and right neck.  No rash elsewhere.  Objective:  Vital signs in last 24 hours:  Blood pressure 114/80, pulse 88, temperature 98.1 F (36.7 C), temperature source Oral, resp. rate 18, height _0  (1.702 m), weight 139 lb 3.2 oz (63.1 kg), SpO2 98 %.    HEENT: No thrush or ulcers. Resp: Lungs clear bilaterally. Cardio: Regular rate and rhythm. GI: No hepatomegaly. Vascular: No leg edema. Neuro: Vibratory sense minimally decreased over the fingertips per tuning fork exam. Skin: Dry appearing rash on the right side of the face and neck, difficult to assess due to patient application of a white cream. Port-A-Cath without erythema.  Lab Results:  Lab Results  Component Value Date   WBC 4.8 06/28/2022   HGB 11.0 (L) 06/28/2022   HCT 35.1 (L) 06/28/2022   MCV 85.2 06/28/2022   PLT 124 (L) 06/28/2022   NEUTROABS 2.5 06/28/2022    Imaging:  No results found.  Medications: I have reviewed the patient's current medications.  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 -02/11/2022 CTA chest-7.8 x 8.5 cm subtle hypoattenuating mass lesion in segment IV of the liver. -02/11/2022 CT abdomen/pelvis-8.7 x 7.8 or ill-defined irregular mass in the medial segment left liver, 4.5 x 3.4 cm necrotic mass lesion just cranial to the esophagogastric junction with metastatic lymphadenopathy in the abdomen and pelvis, irregular nodular peritoneal thickening in  the lower abdomen and pelvis -02/11/2022 MRI abdomen-rim-enhancing likely necrotic mass or lymph node conglomerate centered around the gastroesophageal junction measuring 5.0 x 3.9 cm, large rim-enhancing likely necrotic liver mass centered in the anterior left lobe of the liver measuring 7.8 x 7.2 cm, enlarged gastrohepatic ligament and retroperitoneal lymph nodes -Labs from 02/11/2022-AFP 2.4, CA 19.9 88, CEA 287 -Ultrasound-guided liver biopsy performed 02/15/2022-adenocarcinoma with extensive necrosis, CK7 positive, negative for CK20, CDX2, TTF-1, and PAX8, consistent with a pancreatic, lung, or upper gastrointestinal primary -PET 03/08/2022-hypermetabolic distal esophagus mass, hypermetabolic centrally necrotic hepatic mass, hypermetabolic periportal nodes, exophytic GE junction mass without hypermetabolism-likely benign lesion, no hypermetabolism in the pancreas head, bilateral hypermetabolic adrenal gland lesions, small hypermetabolic left parotid gland lesion.  No loss of mismatch repair protein expression, HER2 3+ -CTs 03/11/2022-no change from PET 03/08/2022, known distal esophagus mass-not visualized on unenhanced CT, large hepatic metastasis and upper abdominal nodal metastases, chronic pancreatitis with 4.3 cm pseudocyst in the pancreas head -Cycle 1 FOLFOX, trastuzumab, Pembrolizumab 04/06/2022 -Cycle 2 oxaliplatin, 5-FU bolus/pump and trastuzumab held due to hospitalization following cycle 1 with possible coronary vasospasm -Pembrolizumab 04/25/2022 -CT abdomen/pelvis 05/02/2022-increased peripancreatic edema, increased size of pancreas head pseudocyst, increased necrotic nodal masses at the GE junction -Cycle 3 oxaliplatin, trastuzumab, pembrolizumab 05/15/2022; 5-FU held secondary to cardiac toxicity following cycle 1 -Cycle 4 oxaliplatin, trastuzumab 05/29/2022, 5-FU held secondary to cardiac toxicity -Cycle 5 oxaliplatin, trastuzumab, Pembrolizumab (6-week dosing for Pembrolizumab) 06/12/2022,  5-FU held secondary to cardiac toxicity -Cycle 6 oxaliplatin, trastuzumab 06/28/2022, 5-FU held 2.  Myocardial infarction/cardiac arrest 01/31/2022 -Echocardiogram 02/01/2022-LVEF 60-65%, right ventricle severely dilated with hypokinesis in  the basal and midportion, hypokinesis in the apical portion, right ventricular systolic function moderately reduced -Echocardiogram on 05/01/2022, LVEF 50-55%, decreased right ventricular systolic function, moderately enlarged right ventricle 3.  Microcytic anemia -Labs from 02/11/2022-ferritin 151, iron 13, percent saturation 5% 4.  DVT/pulmonary embolism-bilateral lower extremity DVTs 01/10/2022, pulmonary emboli on chest CT 01/31/2022-apixaban 5.  PAD status post AKA in 2021 6.  RA 7.  Bilateral pleural effusions -Ultrasound-guided thoracentesis performed on 02/11/2022.  Cytology negative for malignancy. 8.  COPD 9.  Admission 03/11/2022 with E. coli bacteremia Ultrasound-guided biopsy/aspiration of dominant necrotic appearing liver lesion 03/17/2022-scant fluid aspirated-negative culture, no organisms or white cells.  Pathology with necrotic tissue. 10.  Admission 04/29/2022 after a fall, multiple skull fractures 11.  Fever, hypotension-Klebsiella bacteremia on blood culture 04/30/2022 12.  Admission 04/09/2022 with chest pain, NSTEMI    Disposition: Mr. Filter appears stable.  He has completed 5 cycles of systemic therapy.  Currently receives oxaliplatin and trastuzumab every 2 weeks, Pembrolizumab every 6 weeks.  5-fluorouracil on hold secondary to possible coronary vasospasm.  Plan to proceed with oxaliplatin and trastuzumab today as scheduled.  Next Pembrolizumab due 07/24/2022.  We will follow-up on the CEA from today.  Plan for restaging CTs prior to next office visit.  CBC and chemistry panel reviewed.  Labs adequate to proceed as above.  He has hypokalemia.  He will resume the potassium supplement as previously prescribed.  He has a dry skin rash on the right  side of his face.  I encouraged him to apply a moisturizer.  He will return for lab, follow-up, oxaliplatin and trastuzumab in 2 weeks.  He will contact the office in the interim with any problems.  Ned Card ANP/GNP-BC   06/28/2022  9:21 AM

## 2022-06-28 NOTE — Patient Instructions (Signed)
[facility]   Discharge Instructions: Thank you for choosing [facility] to provide your oncology and hematology care.   If you have a lab appointment with the [facility], please go directly to the [facility] and check in at the registration area.   Wear comfortable clothing and clothing appropriate for easy access to any Portacath or PICC line.   We strive to give you quality time with your provider. You may need to reschedule your appointment if you arrive late (15 or more minutes).  Arriving late affects you and other patients whose appointments are after yours.  Also, if you miss three or more appointments without notifying the office, you may be dismissed from the clinic at the provider's discretion.      For prescription refill requests, have your pharmacy contact our office and allow 72 hours for refills to be completed.    Today you received the following chemotherapy and/or immunotherapy agents Trastuzumab-anns (KANJINTI) & Oxaliplatin (ELOXATIN).      To help prevent nausea and vomiting after your treatment, we encourage you to take your nausea medication as directed.  BELOW ARE SYMPTOMS THAT SHOULD BE REPORTED IMMEDIATELY: *FEVER GREATER THAN 100.4 F (38 C) OR HIGHER *CHILLS OR SWEATING *NAUSEA AND VOMITING THAT IS NOT CONTROLLED WITH YOUR NAUSEA MEDICATION *UNUSUAL SHORTNESS OF BREATH *UNUSUAL BRUISING OR BLEEDING *URINARY PROBLEMS (pain or burning when urinating, or frequent urination) *BOWEL PROBLEMS (unusual diarrhea, constipation, pain near the anus) TENDERNESS IN MOUTH AND THROAT WITH OR WITHOUT PRESENCE OF ULCERS (sore throat, sores in mouth, or a toothache) UNUSUAL RASH, SWELLING OR PAIN  UNUSUAL VAGINAL DISCHARGE OR ITCHING   Items with * indicate a potential emergency and should be followed up as soon as possible or go to the Emergency Department if any problems should occur.  Please show the CHEMOTHERAPY ALERT CARD  or IMMUNOTHERAPY ALERT CARD at check-in to the Emergency Department and triage nurse.  Should you have questions after your visit or need to cancel or reschedule your appointment, please contact [facility]  Dept: [phone]  and follow the prompts.  Office hours are 8:00 a.m. to 4:30 p.m. Monday - Friday. Please note that voicemails left after 4:00 p.m. may not be returned until the following business day.  We are closed weekends and major holidays. You have access to a nurse at all times for urgent questions. Please call the main number to the clinic Dept: [phone] and follow the prompts.   For any non-urgent questions, you may also contact your provider using MyChart. We now offer e-Visits for anyone 27 and older to request care online for non-urgent symptoms. For details visit [email or web address].   Also download the MyChart app! Go to the app store, search "MyChart", open the app, select [facility], and log in with your MyChart username and password.  Trastuzumab Injection What is this medication? TRASTUZUMAB (tras TOO zoo mab) treats breast cancer and stomach cancer. It works by blocking a protein that causes cancer cells to grow and multiply. This helps to slow or stop the spread of cancer cells. This medicine may be used for other purposes; ask your health care provider or pharmacist if you have questions. COMMON BRAND NAME(S): Herceptin, Janae Bridgeman, Ontruzant, Trazimera What should I tell my care team before I take this medication? They need to know if you have any of these conditions: Heart failure Lung disease An unusual or allergic reaction to trastuzumab, other  medications, foods, dyes, or preservatives Pregnant or trying to get pregnant Breast-feeding How should I use this medication? This medication is injected into a vein. It is given by your care team in a hospital or clinic setting. Talk to your care team about the use of  this medication in children. It is not approved for use in children. Overdosage: If you think you have taken too much of this medicine contact a poison control center or emergency room at once. NOTE: This medicine is only for you. Do not share this medicine with others. What if I miss a dose? Keep appointments for follow-up doses. It is important not to miss your dose. Call your care team if you are unable to keep an appointment. What may interact with this medication? Certain types of chemotherapy, such as daunorubicin, doxorubicin, epirubicin, idarubicin This list may not describe all possible interactions. Give your health care provider a list of all the medicines, herbs, non-prescription drugs, or dietary supplements you use. Also tell them if you smoke, drink alcohol, or use illegal drugs. Some items may interact with your medicine. What should I watch for while using this medication? Your condition will be monitored carefully while you are receiving this medication. This medication may make you feel generally unwell. This is not uncommon, as chemotherapy affects healthy cells as well as cancer cells. Report any side effects. Continue your course of treatment even though you feel ill unless your care team tells you to stop. This medication may increase your risk of getting an infection. Call your care team for advice if you get a fever, chills, sore throat, or other symptoms of a cold or flu. Do not treat yourself. Try to avoid being around people who are sick. Avoid taking medications that contain aspirin, acetaminophen, ibuprofen, naproxen, or ketoprofen unless instructed by your care team. These medications can hide a fever. Talk to your care team if you may be pregnant. Serious birth defects can occur if you take this medication during pregnancy and for 7 months after the last dose. You will need a negative pregnancy test before starting this medication. Contraception is recommended while  taking this medication and for 7 months after the last dose. Your care team can help you find the option that works for you. Do not breastfeed while taking this medication and for 7 months after stopping treatment. What side effects may I notice from receiving this medication? Side effects that you should report to your care team as soon as possible: Allergic reactions or angioedema--skin rash, itching or hives, swelling of the face, eyes, lips, tongue, arms, or legs, trouble swallowing or breathing Dry cough, shortness of breath or trouble breathing Heart failure--shortness of breath, swelling of the ankles, feet, or hands, sudden weight gain, unusual weakness or fatigue Infection--fever, chills, cough, or sore throat Infusion reactions--chest pain, shortness of breath or trouble breathing, feeling faint or lightheaded Side effects that usually do not require medical attention (report to your care team if they continue or are bothersome): Diarrhea Dizziness Headache Nausea Trouble sleeping Vomiting This list may not describe all possible side effects. Call your doctor for medical advice about side effects. You may report side effects to FDA at 1-800-FDA-1088. Where should I keep my medication? This medication is given in a hospital or clinic. It will not be stored at home. NOTE: This sheet is a summary. It may not cover all possible information. If you have questions about this medicine, talk to your doctor, pharmacist, or health  care provider.  2023 Elsevier/Gold Standard (2021-10-13 00:00:00)  Oxaliplatin Injection What is this medication? OXALIPLATIN (ox AL i PLA tin) treats some types of cancer. It works by slowing down the growth of cancer cells. This medicine may be used for other purposes; ask your health care provider or pharmacist if you have questions. COMMON BRAND NAME(S): Eloxatin What should I tell my care team before I take this medication? They need to know if you have  any of these conditions: Heart disease History of irregular heartbeat or rhythm Liver disease Low blood cell levels (white cells, red cells, and platelets) Lung or breathing disease, such as asthma Take medications that treat or prevent blood clots Tingling of the fingers, toes, or other nerve disorder An unusual or allergic reaction to oxaliplatin, other medications, foods, dyes, or preservatives If you or your partner are pregnant or trying to get pregnant Breast-feeding How should I use this medication? This medication is injected into a vein. It is given by your care team in a hospital or clinic setting. Talk to your care team about the use of this medication in children. Special care may be needed. Overdosage: If you think you have taken too much of this medicine contact a poison control center or emergency room at once. NOTE: This medicine is only for you. Do not share this medicine with others. What if I miss a dose? Keep appointments for follow-up doses. It is important not to miss a dose. Call your care team if you are unable to keep an appointment. What may interact with this medication? Do not take this medication with any of the following: Cisapride Dronedarone Pimozide Thioridazine This medication may also interact with the following: Aspirin and aspirin-like medications Certain medications that treat or prevent blood clots, such as warfarin, apixaban, dabigatran, and rivaroxaban Cisplatin Cyclosporine Diuretics Medications for infection, such as acyclovir, adefovir, amphotericin B, bacitracin, cidofovir, foscarnet, ganciclovir, gentamicin, pentamidine, vancomycin NSAIDs, medications for pain and inflammation, such as ibuprofen or naproxen Other medications that cause heart rhythm changes Pamidronate Zoledronic acid This list may not describe all possible interactions. Give your health care provider a list of all the medicines, herbs, non-prescription drugs, or dietary  supplements you use. Also tell them if you smoke, drink alcohol, or use illegal drugs. Some items may interact with your medicine. What should I watch for while using this medication? Your condition will be monitored carefully while you are receiving this medication. You may need blood work while taking this medication. This medication may make you feel generally unwell. This is not uncommon as chemotherapy can affect healthy cells as well as cancer cells. Report any side effects. Continue your course of treatment even though you feel ill unless your care team tells you to stop. This medication may increase your risk of getting an infection. Call your care team for advice if you get a fever, chills, sore throat, or other symptoms of a cold or flu. Do not treat yourself. Try to avoid being around people who are sick. Avoid taking medications that contain aspirin, acetaminophen, ibuprofen, naproxen, or ketoprofen unless instructed by your care team. These medications may hide a fever. Be careful brushing or flossing your teeth or using a toothpick because you may get an infection or bleed more easily. If you have any dental work done, tell your dentist you are receiving this medication. This medication can make you more sensitive to cold. Do not drink cold drinks or use ice. Cover exposed skin before coming in  contact with cold temperatures or cold objects. When out in cold weather wear warm clothing and cover your mouth and nose to warm the air that goes into your lungs. Tell your care team if you get sensitive to the cold. Talk to your care team if you or your partner are pregnant or think either of you might be pregnant. This medication can cause serious birth defects if taken during pregnancy and for 9 months after the last dose. A negative pregnancy test is required before starting this medication. A reliable form of contraception is recommended while taking this medication and for 9 months after the  last dose. Talk to your care team about effective forms of contraception. Do not father a child while taking this medication and for 6 months after the last dose. Use a condom while having sex during this time period. Do not breastfeed while taking this medication and for 3 months after the last dose. This medication may cause infertility. Talk to your care team if you are concerned about your fertility. What side effects may I notice from receiving this medication? Side effects that you should report to your care team as soon as possible: Allergic reactions--skin rash, itching, hives, swelling of the face, lips, tongue, or throat Bleeding--bloody or black, tar-like stools, vomiting blood or brown material that looks like coffee grounds, red or dark brown urine, small red or purple spots on skin, unusual bruising or bleeding Dry cough, shortness of breath or trouble breathing Heart rhythm changes--fast or irregular heartbeat, dizziness, feeling faint or lightheaded, chest pain, trouble breathing Infection--fever, chills, cough, sore throat, wounds that don't heal, pain or trouble when passing urine, general feeling of discomfort or being unwell Liver injury--right upper belly pain, loss of appetite, nausea, light-colored stool, dark yellow or brown urine, yellowing skin or eyes, unusual weakness or fatigue Low red blood cell level--unusual weakness or fatigue, dizziness, headache, trouble breathing Muscle injury--unusual weakness or fatigue, muscle pain, dark yellow or brown urine, decrease in amount of urine Pain, tingling, or numbness in the hands or feet Sudden and severe headache, confusion, change in vision, seizures, which may be signs of posterior reversible encephalopathy syndrome (PRES) Unusual bruising or bleeding Side effects that usually do not require medical attention (report to your care team if they continue or are bothersome): Diarrhea Nausea Pain, redness, or swelling with sores  inside the mouth or throat Unusual weakness or fatigue Vomiting This list may not describe all possible side effects. Call your doctor for medical advice about side effects. You may report side effects to FDA at 1-800-FDA-1088. Where should I keep my medication? This medication is given in a hospital or clinic. It will not be stored at home. NOTE: This sheet is a summary. It may not cover all possible information. If you have questions about this medicine, talk to your doctor, pharmacist, or health care provider.  2023 Elsevier/Gold Standard (2007-08-03 00:00:00)

## 2022-06-28 NOTE — Telephone Encounter (Signed)
Call from pt's caregiver - requesting a refill on Meloxicam and Oxycodone. Stated pt had requested a refill since last week - informed her the office was closed for several days d/t holidays. Requesting meds be refilled today. Oxycodone is not on current med list. Thanks

## 2022-06-28 NOTE — Progress Notes (Signed)
Nutrition follow up completed with patient during infusion for metastatic cancer, likely Esophageal.  Weight documented as 139# 3.2 oz on Jan 3, stable from 139# on Dec 18.  Labs noted: potassium 3.1  Patient reports appetite is stable. He continues to try to eat adequately and drinks at least one bottle of Ensure Plus or equivalent every other day. He has occasional nausea but is tolerating a regular diet. Continues to drink milk often.   Nutrition Diagnosis: Food and Nutrition Related Knowledge Deficit improved.  Intervention: Educated to drink 1-2 ONS daily. Provided samples and coupons. Eat small, frequent meals and snacks. Drink adequate fluids.  Monitoring, Evaluation, Goals: Patient will tolerate adequate calories and protein for maintenance of lean body mass.  Next Visit: To be scheduled as needed.

## 2022-06-28 NOTE — Progress Notes (Signed)
Patient seen by Ned Card NP today  Vitals are within treatment parameters.  Labs reviewed by Ned Card NP and are not all within treatment parameters. K+ is 3.1  Patient is will start back taking his K+.   Per physician team, patient is ready for treatment and there are NO modifications to the treatment plan.

## 2022-06-29 ENCOUNTER — Telehealth: Payer: Self-pay | Admitting: Student

## 2022-06-29 ENCOUNTER — Other Ambulatory Visit: Payer: Self-pay

## 2022-06-29 ENCOUNTER — Telehealth: Payer: Self-pay

## 2022-06-29 ENCOUNTER — Telehealth: Payer: Self-pay | Admitting: *Deleted

## 2022-06-29 NOTE — Telephone Encounter (Signed)
Pt (SO) would like a call back about the following medication   oxyCODONE-Acetaminophen 5-325 MG 1 tablet Oral Every 6 hours PRN   [facility], [place] - [address] [place], [place] [address] Phone: [phone]  Fax: [phone]

## 2022-06-29 NOTE — Telephone Encounter (Signed)
CT scan has been order and the patient is aware of the time and place

## 2022-06-30 ENCOUNTER — Telehealth: Payer: Self-pay | Admitting: Student

## 2022-06-30 ENCOUNTER — Telehealth: Payer: Self-pay | Admitting: *Deleted

## 2022-06-30 ENCOUNTER — Inpatient Hospital Stay: Payer: 59

## 2022-06-30 NOTE — Telephone Encounter (Signed)
Received call from [place], [place] with [facility]. Requesting VO to move [facility] PT re-certification to 10/28/07. States patient's family would not let her in the home today as patient was sleeping. She will attempt to see patient on 1/8. Verbal auth given.

## 2022-07-04 ENCOUNTER — Encounter: Payer: Self-pay | Admitting: Oncology

## 2022-07-06 ENCOUNTER — Other Ambulatory Visit: Payer: Self-pay | Admitting: Student

## 2022-07-06 NOTE — Telephone Encounter (Signed)
oxyCODONE-acetaminophen (PERCOCET/ROXICET) 5-325 MG tablet   [place],  - [place]

## 2022-07-07 ENCOUNTER — Other Ambulatory Visit: Payer: Self-pay

## 2022-07-07 ENCOUNTER — Ambulatory Visit (HOSPITAL_COMMUNITY): Payer: 59

## 2022-07-07 NOTE — Telephone Encounter (Signed)
Also requesting refill on oxycodone.

## 2022-07-08 ENCOUNTER — Other Ambulatory Visit: Payer: Self-pay | Admitting: Oncology

## 2022-07-08 DIAGNOSIS — C159 Malignant neoplasm of esophagus, unspecified: Secondary | ICD-10-CM

## 2022-07-08 DIAGNOSIS — C801 Malignant (primary) neoplasm, unspecified: Secondary | ICD-10-CM

## 2022-07-10 ENCOUNTER — Inpatient Hospital Stay: Payer: 59

## 2022-07-10 ENCOUNTER — Other Ambulatory Visit: Payer: Self-pay | Admitting: Student

## 2022-07-10 ENCOUNTER — Inpatient Hospital Stay: Payer: 59 | Admitting: Oncology

## 2022-07-10 MED ORDER — OXYCODONE-ACETAMINOPHEN 5-325 MG PO TABS: 1.0000 | ORAL_TABLET | Freq: Four times a day (QID) | ORAL | 0 refills | Status: DC | PRN

## 2022-07-11 MED ORDER — ONDANSETRON HCL 8 MG PO TABS: ORAL_TABLET | ORAL | 0 refills | Status: DC

## 2022-07-12 ENCOUNTER — Ambulatory Visit (HOSPITAL_COMMUNITY): Payer: 59

## 2022-07-12 ENCOUNTER — Other Ambulatory Visit: Payer: Self-pay

## 2022-07-12 ENCOUNTER — Inpatient Hospital Stay: Payer: 59

## 2022-07-17 ENCOUNTER — Inpatient Hospital Stay: Payer: 59

## 2022-07-17 ENCOUNTER — Encounter: Payer: Self-pay | Admitting: Nurse Practitioner

## 2022-07-17 ENCOUNTER — Other Ambulatory Visit: Payer: Self-pay

## 2022-07-17 ENCOUNTER — Inpatient Hospital Stay (HOSPITAL_BASED_OUTPATIENT_CLINIC_OR_DEPARTMENT_OTHER): Payer: 59 | Admitting: Nurse Practitioner

## 2022-07-17 VITALS — BP 126/86 | HR 75

## 2022-07-17 VITALS — BP 108/72 | HR 62 | Temp 98.1°F | Resp 18 | Ht 67.0 in | Wt 137.3 lb

## 2022-07-17 DIAGNOSIS — J449 Chronic obstructive pulmonary disease, unspecified: Secondary | ICD-10-CM | POA: Diagnosis not present

## 2022-07-17 DIAGNOSIS — C801 Malignant (primary) neoplasm, unspecified: Secondary | ICD-10-CM

## 2022-07-17 DIAGNOSIS — C159 Malignant neoplasm of esophagus, unspecified: Secondary | ICD-10-CM

## 2022-07-17 DIAGNOSIS — D509 Iron deficiency anemia, unspecified: Secondary | ICD-10-CM | POA: Diagnosis not present

## 2022-07-17 DIAGNOSIS — Z79899 Other long term (current) drug therapy: Secondary | ICD-10-CM | POA: Diagnosis not present

## 2022-07-17 DIAGNOSIS — Z86711 Personal history of pulmonary embolism: Secondary | ICD-10-CM | POA: Diagnosis not present

## 2022-07-17 DIAGNOSIS — I517 Cardiomegaly: Secondary | ICD-10-CM | POA: Diagnosis not present

## 2022-07-17 DIAGNOSIS — M0579 Rheumatoid arthritis with rheumatoid factor of multiple sites without organ or systems involvement: Secondary | ICD-10-CM

## 2022-07-17 DIAGNOSIS — C787 Secondary malignant neoplasm of liver and intrahepatic bile duct: Secondary | ICD-10-CM | POA: Diagnosis not present

## 2022-07-17 DIAGNOSIS — Z5111 Encounter for antineoplastic chemotherapy: Secondary | ICD-10-CM | POA: Diagnosis not present

## 2022-07-17 DIAGNOSIS — J90 Pleural effusion, not elsewhere classified: Secondary | ICD-10-CM | POA: Diagnosis not present

## 2022-07-17 DIAGNOSIS — I252 Old myocardial infarction: Secondary | ICD-10-CM | POA: Diagnosis not present

## 2022-07-17 LAB — CMP (CANCER CENTER ONLY)
ALT: 25 U/L (ref 0–44)
AST: 34 U/L (ref 15–41)
Albumin: 3.5 g/dL (ref 3.5–5.0)
Alkaline Phosphatase: 219 U/L — ABNORMAL HIGH (ref 38–126)
Anion gap: 11 (ref 5–15)
BUN: 16 mg/dL (ref 8–23)
CO2: 26 mmol/L (ref 22–32)
Calcium: 9.4 mg/dL (ref 8.9–10.3)
Chloride: 103 mmol/L (ref 98–111)
Creatinine: 0.82 mg/dL (ref 0.61–1.24)
GFR, Estimated: >60 mL/min (ref >60)
Glucose, Bld: 172 mg/dL — ABNORMAL HIGH (ref 70–99)
Potassium: 3 mmol/L — ABNORMAL LOW (ref 3.5–5.1)
Sodium: 140 mmol/L (ref 135–145)
Total Bilirubin: 0.3 mg/dL (ref 0.3–1.2)
Total Protein: 7.2 g/dL (ref 6.5–8.1)

## 2022-07-17 LAB — CBC WITH DIFFERENTIAL (CANCER CENTER ONLY)
Abs Immature Granulocytes: 0.01 10*3/uL (ref 0.00–0.07)
Basophils Absolute: 0 10*3/uL (ref 0.0–0.1)
Basophils Relative: 1 %
Eosinophils Absolute: 0.3 10*3/uL (ref 0.0–0.5)
Eosinophils Relative: 5 %
HCT: 35.2 % — ABNORMAL LOW (ref 39.0–52.0)
Hemoglobin: 10.8 g/dL — ABNORMAL LOW (ref 13.0–17.0)
Immature Granulocytes: 0 %
Lymphocytes Relative: 19 %
Lymphs Abs: 1 10*3/uL (ref 0.7–4.0)
MCH: 27.7 pg (ref 26.0–34.0)
MCHC: 30.7 g/dL (ref 30.0–36.0)
MCV: 90.3 fL (ref 80.0–100.0)
Monocytes Absolute: 0.6 10*3/uL (ref 0.1–1.0)
Monocytes Relative: 11 %
Neutro Abs: 3.5 10*3/uL (ref 1.7–7.7)
Neutrophils Relative %: 64 %
Platelet Count: 138 10*3/uL — ABNORMAL LOW (ref 150–400)
RBC: 3.9 MIL/uL — ABNORMAL LOW (ref 4.22–5.81)
RDW: 20.8 % — ABNORMAL HIGH (ref 11.5–15.5)
WBC Count: 5.5 10*3/uL (ref 4.0–10.5)
nRBC: 0 % (ref 0.0–0.2)

## 2022-07-17 LAB — CEA (ACCESS): CEA (CHCC): 350.67 ng/mL — ABNORMAL HIGH (ref 0.00–5.00)

## 2022-07-17 MED ORDER — SODIUM CHLORIDE 0.9 % IV SOLN
10.0000 mg | Freq: Once | INTRAVENOUS | Status: AC
  Administered 2022-07-17: 10 mg via INTRAVENOUS
  Filled 2022-07-17: qty 10

## 2022-07-17 MED ORDER — TRASTUZUMAB-ANNS CHEMO 150 MG IV SOLR
4.0000 mg/kg | Freq: Once | INTRAVENOUS | Status: AC
  Administered 2022-07-17: 252 mg via INTRAVENOUS
  Filled 2022-07-17: qty 12

## 2022-07-17 MED ORDER — PALONOSETRON HCL INJECTION 0.25 MG/5ML
0.2500 mg | Freq: Once | INTRAVENOUS | Status: AC
  Administered 2022-07-17: 0.25 mg via INTRAVENOUS
  Filled 2022-07-17: qty 5

## 2022-07-17 MED ORDER — DIPHENHYDRAMINE HCL 25 MG PO CAPS
50.0000 mg | ORAL_CAPSULE | Freq: Once | ORAL | Status: AC
  Administered 2022-07-17: 50 mg via ORAL
  Filled 2022-07-17: qty 2

## 2022-07-17 MED ORDER — SODIUM CHLORIDE 0.9% FLUSH
10.0000 mL | INTRAVENOUS | Status: DC | PRN
  Administered 2022-07-17: 10 mL

## 2022-07-17 MED ORDER — SODIUM CHLORIDE 0.9 % IV SOLN: Freq: Once | INTRAVENOUS | Status: AC

## 2022-07-17 MED ORDER — HEPARIN SOD (PORK) LOCK FLUSH 100 UNIT/ML IV SOLN
500.0000 [IU] | Freq: Once | INTRAVENOUS | Status: AC | PRN
  Administered 2022-07-17: 500 [IU]

## 2022-07-17 MED ORDER — OXALIPLATIN CHEMO INJECTION 100 MG/20ML
85.0000 mg/m2 | Freq: Once | INTRAVENOUS | Status: AC
  Administered 2022-07-17: 150 mg via INTRAVENOUS
  Filled 2022-07-17: qty 20

## 2022-07-17 MED ORDER — ACETAMINOPHEN 325 MG PO TABS
650.0000 mg | ORAL_TABLET | Freq: Once | ORAL | Status: AC
  Administered 2022-07-17: 650 mg via ORAL
  Filled 2022-07-17: qty 2

## 2022-07-17 MED ORDER — DEXTROSE 5 % IV SOLN: Freq: Once | INTRAVENOUS | Status: AC

## 2022-07-17 NOTE — Progress Notes (Signed)
[facility] OFFICE PROGRESS NOTE   Diagnosis: Esophagus cancer  INTERVAL HISTORY:   [name] [name] returns as scheduled.  He completed a cycle of oxaliplatin and trastuzumab 06/28/2022.  He had to cancel his appointments last week due to "flu".  He continues to feel a little weak and tired but better.  He has a good appetite.  He had no nausea or vomiting following the most recent chemotherapy.  No mouth sores.  No diarrhea.  No rash.  He notes mild cold sensitivity in the right hand.  No other neuropathy symptoms.  Objective:  Vital signs in last 24 hours:  Blood pressure 108/72, pulse 62, temperature 98.1 F (36.7 C), temperature source Oral, resp. rate 18, height _0  (1.702 m), weight 137 lb 4.8 oz (62.3 kg), SpO2 96 %.    HEENT: No thrush or ulcers. Resp: Lungs clear bilaterally. Cardio: Regular rate and rhythm. GI: No hepatomegaly. Vascular: No right leg edema. Neuro: Vibratory sense minimally decreased over the fingertips per tuning fork exam. Skin: Palms without erythema. Port-A-Cath without erythema.  Lab Results:  Lab Results  Component Value Date   WBC 5.5 07/17/2022   HGB 10.8 (L) 07/17/2022   HCT 35.2 (L) 07/17/2022   MCV 90.3 07/17/2022   PLT 138 (L) 07/17/2022   NEUTROABS 3.5 07/17/2022    Imaging:  No results found.  Medications: I have reviewed the patient's current medications.  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 -02/11/2022 CTA chest-7.8 x 8.5 cm subtle hypoattenuating mass lesion in segment IV of the liver. -02/11/2022 CT abdomen/pelvis-8.7 x 7.8 or ill-defined irregular mass in the medial segment left liver, 4.5 x 3.4 cm necrotic mass lesion just cranial to the esophagogastric junction with metastatic lymphadenopathy in the abdomen and pelvis, irregular nodular peritoneal  thickening in the lower abdomen and pelvis -02/11/2022 MRI abdomen-rim-enhancing likely necrotic mass or lymph node conglomerate centered around the gastroesophageal junction measuring 5.0 x 3.9 cm, large rim-enhancing likely necrotic liver mass centered in the anterior left lobe of the liver measuring 7.8 x 7.2 cm, enlarged gastrohepatic ligament and retroperitoneal lymph nodes -Labs from 02/11/2022-AFP 2.4, CA 19.9 88, CEA 287 -Ultrasound-guided liver biopsy performed 02/15/2022-adenocarcinoma with extensive necrosis, CK7 positive, negative for CK20, CDX2, TTF-1, and PAX8, consistent with a pancreatic, lung, or upper gastrointestinal primary -PET 03/08/2022-hypermetabolic distal esophagus mass, hypermetabolic centrally necrotic hepatic mass, hypermetabolic periportal nodes, exophytic GE junction mass without hypermetabolism-likely benign lesion, no hypermetabolism in the pancreas head, bilateral hypermetabolic adrenal gland lesions, small hypermetabolic left parotid gland lesion.  No loss of mismatch repair protein expression, HER2 3+ -CTs 03/11/2022-no change from PET 03/08/2022, known distal esophagus mass-not visualized on unenhanced CT, large hepatic metastasis and upper abdominal nodal metastases, chronic pancreatitis with 4.3 cm pseudocyst in the pancreas head -Cycle 1 FOLFOX, trastuzumab, Pembrolizumab 04/06/2022 -Cycle 2 oxaliplatin, 5-FU bolus/pump and trastuzumab held due to hospitalization following cycle 1 with possible coronary vasospasm -Pembrolizumab 04/25/2022 -CT abdomen/pelvis 05/02/2022-increased peripancreatic edema, increased size of pancreas head pseudocyst, increased necrotic nodal masses at the GE junction -Cycle 3 oxaliplatin, trastuzumab, pembrolizumab 05/15/2022; 5-FU held secondary to cardiac toxicity following cycle 1 -Cycle 4 oxaliplatin, trastuzumab 05/29/2022, 5-FU held secondary to cardiac toxicity -Cycle 5 oxaliplatin, trastuzumab, Pembrolizumab (6-week dosing for  Pembrolizumab) 06/12/2022, 5-FU held secondary to cardiac toxicity -Cycle 6 oxaliplatin, trastuzumab 06/28/2022, 5-FU held -Cycle 7 oxaliplatin, trastuzumab 07/17/2022, 5-FU held 2.  Myocardial infarction/cardiac arrest  01/31/2022 -Echocardiogram 02/01/2022-LVEF 60-65%, right ventricle severely dilated with hypokinesis in the basal and midportion, hypokinesis in the apical portion, right ventricular systolic function moderately reduced -Echocardiogram on 05/01/2022, LVEF 50-55%, decreased right ventricular systolic function, moderately enlarged right ventricle 3.  Microcytic anemia -Labs from 02/11/2022-ferritin 151, iron 13, percent saturation 5% 4.  DVT/pulmonary embolism-bilateral lower extremity DVTs 01/10/2022, pulmonary emboli on chest CT 01/31/2022-apixaban 5.  PAD status post AKA in 2021 6.  RA 7.  Bilateral pleural effusions -Ultrasound-guided thoracentesis performed on 02/11/2022.  Cytology negative for malignancy. 8.  COPD 9.  Admission 03/11/2022 with E. coli bacteremia Ultrasound-guided biopsy/aspiration of dominant necrotic appearing liver lesion 03/17/2022-scant fluid aspirated-negative culture, no organisms or white cells.  Pathology with necrotic tissue. 10.  Admission 04/29/2022 after a fall, multiple skull fractures 11.  Fever, hypotension-Klebsiella bacteremia on blood culture 04/30/2022 12.  Admission 04/09/2022 with chest pain, NSTEMI    Disposition: [name] [name] appears stable.  He is on active treatment with oxaliplatin, trastuzumab every 2 weeks and Pembrolizumab every 6 weeks.  He is tolerating treatment well.  Plan to proceed with oxaliplatin and trastuzumab today as scheduled.  Pembrolizumab due in 2 weeks.  He will undergo restaging CTs prior to the next office visit.  CBC from today reviewed.  Counts adequate to proceed with treatment.  He will return for lab, follow-up, treatment in 2 weeks.  We are available to see him sooner if needed.   Ned Card ANP/GNP-BC   07/17/2022   10:01 AM

## 2022-07-17 NOTE — Patient Instructions (Signed)
[place]   Discharge Instructions: Thank you for choosing [facility] to provide your oncology and hematology care.   If you have a lab appointment with the [facility], please go directly to the [facility] and check in at the registration area.   Wear comfortable clothing and clothing appropriate for easy access to any Portacath or PICC line.   We strive to give you quality time with your provider. You may need to reschedule your appointment if you arrive late (15 or more minutes).  Arriving late affects you and other patients whose appointments are after yours.  Also, if you miss three or more appointments without notifying the office, you may be dismissed from the clinic at the provider's discretion.      For prescription refill requests, have your pharmacy contact our office and allow 72 hours for refills to be completed.    Today you received the following chemotherapy and/or immunotherapy agents Trastuzumab-anns (KANJINTI) & Oxaliplatin (ELOXATIN).      To help prevent nausea and vomiting after your treatment, we encourage you to take your nausea medication as directed.  BELOW ARE SYMPTOMS THAT SHOULD BE REPORTED IMMEDIATELY: *FEVER GREATER THAN 100.4 F (38 C) OR HIGHER *CHILLS OR SWEATING *NAUSEA AND VOMITING THAT IS NOT CONTROLLED WITH YOUR NAUSEA MEDICATION *UNUSUAL SHORTNESS OF BREATH *UNUSUAL BRUISING OR BLEEDING *URINARY PROBLEMS (pain or burning when urinating, or frequent urination) *BOWEL PROBLEMS (unusual diarrhea, constipation, pain near the anus) TENDERNESS IN MOUTH AND THROAT WITH OR WITHOUT PRESENCE OF ULCERS (sore throat, sores in mouth, or a toothache) UNUSUAL RASH, SWELLING OR PAIN  UNUSUAL VAGINAL DISCHARGE OR ITCHING   Items with * indicate a potential emergency and should be followed up as soon as possible or go to the Emergency Department if any problems should occur.  Please show the CHEMOTHERAPY  ALERT CARD or IMMUNOTHERAPY ALERT CARD at check-in to the Emergency Department and triage nurse.  Should you have questions after your visit or need to cancel or reschedule your appointment, please contact [facility]  Dept: [phone]  and follow the prompts.  Office hours are 8:00 a.m. to 4:30 p.m. Monday - Friday. Please note that voicemails left after 4:00 p.m. may not be returned until the following business day.  We are closed weekends and major holidays. You have access to a nurse at all times for urgent questions. Please call the main number to the clinic Dept: [phone] and follow the prompts.   For any non-urgent questions, you may also contact your provider using MyChart. We now offer e-Visits for anyone 64 and older to request care online for non-urgent symptoms. For details visit [email or web address].   Also download the MyChart app! Go to the app store, search "MyChart", open the app, select [facility], and log in with your MyChart username and password.  Trastuzumab Injection What is this medication? TRASTUZUMAB (tras TOO zoo mab) treats breast cancer and stomach cancer. It works by blocking a protein that causes cancer cells to grow and multiply. This helps to slow or stop the spread of cancer cells. This medicine may be used for other purposes; ask your health care provider or pharmacist if you have questions. COMMON BRAND NAME(S): Herceptin, Janae Bridgeman, Ontruzant, Trazimera What should I tell my care team before I take this medication? They need to know if you have any of these conditions: Heart failure Lung disease An unusual or allergic reaction to  trastuzumab, other medications, foods, dyes, or preservatives Pregnant or trying to get pregnant Breast-feeding How should I use this medication? This medication is injected into a vein. It is given by your care team in a hospital or clinic setting. Talk to your care team  about the use of this medication in children. It is not approved for use in children. Overdosage: If you think you have taken too much of this medicine contact a poison control center or emergency room at once. NOTE: This medicine is only for you. Do not share this medicine with others. What if I miss a dose? Keep appointments for follow-up doses. It is important not to miss your dose. Call your care team if you are unable to keep an appointment. What may interact with this medication? Certain types of chemotherapy, such as daunorubicin, doxorubicin, epirubicin, idarubicin This list may not describe all possible interactions. Give your health care provider a list of all the medicines, herbs, non-prescription drugs, or dietary supplements you use. Also tell them if you smoke, drink alcohol, or use illegal drugs. Some items may interact with your medicine. What should I watch for while using this medication? Your condition will be monitored carefully while you are receiving this medication. This medication may make you feel generally unwell. This is not uncommon, as chemotherapy affects healthy cells as well as cancer cells. Report any side effects. Continue your course of treatment even though you feel ill unless your care team tells you to stop. This medication may increase your risk of getting an infection. Call your care team for advice if you get a fever, chills, sore throat, or other symptoms of a cold or flu. Do not treat yourself. Try to avoid being around people who are sick. Avoid taking medications that contain aspirin, acetaminophen, ibuprofen, naproxen, or ketoprofen unless instructed by your care team. These medications can hide a fever. Talk to your care team if you may be pregnant. Serious birth defects can occur if you take this medication during pregnancy and for 7 months after the last dose. You will need a negative pregnancy test before starting this medication. Contraception is  recommended while taking this medication and for 7 months after the last dose. Your care team can help you find the option that works for you. Do not breastfeed while taking this medication and for 7 months after stopping treatment. What side effects may I notice from receiving this medication? Side effects that you should report to your care team as soon as possible: Allergic reactions or angioedema--skin rash, itching or hives, swelling of the face, eyes, lips, tongue, arms, or legs, trouble swallowing or breathing Dry cough, shortness of breath or trouble breathing Heart failure--shortness of breath, swelling of the ankles, feet, or hands, sudden weight gain, unusual weakness or fatigue Infection--fever, chills, cough, or sore throat Infusion reactions--chest pain, shortness of breath or trouble breathing, feeling faint or lightheaded Side effects that usually do not require medical attention (report to your care team if they continue or are bothersome): Diarrhea Dizziness Headache Nausea Trouble sleeping Vomiting This list may not describe all possible side effects. Call your doctor for medical advice about side effects. You may report side effects to FDA at 1-800-FDA-1088. Where should I keep my medication? This medication is given in a hospital or clinic. It will not be stored at home. NOTE: This sheet is a summary. It may not cover all possible information. If you have questions about this medicine, talk to your doctor, pharmacist,  or health care provider.  2023 Elsevier/Gold Standard (2021-10-13 00:00:00)   Oxaliplatin Injection What is this medication? OXALIPLATIN (ox AL i PLA tin) treats some types of cancer. It works by slowing down the growth of cancer cells. This medicine may be used for other purposes; ask your health care provider or pharmacist if you have questions. COMMON BRAND NAME(S): Eloxatin What should I tell my care team before I take this medication? They need to  know if you have any of these conditions: Heart disease History of irregular heartbeat or rhythm Liver disease Low blood cell levels (white cells, red cells, and platelets) Lung or breathing disease, such as asthma Take medications that treat or prevent blood clots Tingling of the fingers, toes, or other nerve disorder An unusual or allergic reaction to oxaliplatin, other medications, foods, dyes, or preservatives If you or your partner are pregnant or trying to get pregnant Breast-feeding How should I use this medication? This medication is injected into a vein. It is given by your care team in a hospital or clinic setting. Talk to your care team about the use of this medication in children. Special care may be needed. Overdosage: If you think you have taken too much of this medicine contact a poison control center or emergency room at once. NOTE: This medicine is only for you. Do not share this medicine with others. What if I miss a dose? Keep appointments for follow-up doses. It is important not to miss a dose. Call your care team if you are unable to keep an appointment. What may interact with this medication? Do not take this medication with any of the following: Cisapride Dronedarone Pimozide Thioridazine This medication may also interact with the following: Aspirin and aspirin-like medications Certain medications that treat or prevent blood clots, such as warfarin, apixaban, dabigatran, and rivaroxaban Cisplatin Cyclosporine Diuretics Medications for infection, such as acyclovir, adefovir, amphotericin B, bacitracin, cidofovir, foscarnet, ganciclovir, gentamicin, pentamidine, vancomycin NSAIDs, medications for pain and inflammation, such as ibuprofen or naproxen Other medications that cause heart rhythm changes Pamidronate Zoledronic acid This list may not describe all possible interactions. Give your health care provider a list of all the medicines, herbs, non-prescription  drugs, or dietary supplements you use. Also tell them if you smoke, drink alcohol, or use illegal drugs. Some items may interact with your medicine. What should I watch for while using this medication? Your condition will be monitored carefully while you are receiving this medication. You may need blood work while taking this medication. This medication may make you feel generally unwell. This is not uncommon as chemotherapy can affect healthy cells as well as cancer cells. Report any side effects. Continue your course of treatment even though you feel ill unless your care team tells you to stop. This medication may increase your risk of getting an infection. Call your care team for advice if you get a fever, chills, sore throat, or other symptoms of a cold or flu. Do not treat yourself. Try to avoid being around people who are sick. Avoid taking medications that contain aspirin, acetaminophen, ibuprofen, naproxen, or ketoprofen unless instructed by your care team. These medications may hide a fever. Be careful brushing or flossing your teeth or using a toothpick because you may get an infection or bleed more easily. If you have any dental work done, tell your dentist you are receiving this medication. This medication can make you more sensitive to cold. Do not drink cold drinks or use ice. Cover exposed skin  before coming in contact with cold temperatures or cold objects. When out in cold weather wear warm clothing and cover your mouth and nose to warm the air that goes into your lungs. Tell your care team if you get sensitive to the cold. Talk to your care team if you or your partner are pregnant or think either of you might be pregnant. This medication can cause serious birth defects if taken during pregnancy and for 9 months after the last dose. A negative pregnancy test is required before starting this medication. A reliable form of contraception is recommended while taking this medication and for 9  months after the last dose. Talk to your care team about effective forms of contraception. Do not father a child while taking this medication and for 6 months after the last dose. Use a condom while having sex during this time period. Do not breastfeed while taking this medication and for 3 months after the last dose. This medication may cause infertility. Talk to your care team if you are concerned about your fertility. What side effects may I notice from receiving this medication? Side effects that you should report to your care team as soon as possible: Allergic reactions--skin rash, itching, hives, swelling of the face, lips, tongue, or throat Bleeding--bloody or black, tar-like stools, vomiting blood or brown material that looks like coffee grounds, red or dark brown urine, small red or purple spots on skin, unusual bruising or bleeding Dry cough, shortness of breath or trouble breathing Heart rhythm changes--fast or irregular heartbeat, dizziness, feeling faint or lightheaded, chest pain, trouble breathing Infection--fever, chills, cough, sore throat, wounds that don't heal, pain or trouble when passing urine, general feeling of discomfort or being unwell Liver injury--right upper belly pain, loss of appetite, nausea, light-colored stool, dark yellow or brown urine, yellowing skin or eyes, unusual weakness or fatigue Low red blood cell level--unusual weakness or fatigue, dizziness, headache, trouble breathing Muscle injury--unusual weakness or fatigue, muscle pain, dark yellow or brown urine, decrease in amount of urine Pain, tingling, or numbness in the hands or feet Sudden and severe headache, confusion, change in vision, seizures, which may be signs of posterior reversible encephalopathy syndrome (PRES) Unusual bruising or bleeding Side effects that usually do not require medical attention (report to your care team if they continue or are bothersome): Diarrhea Nausea Pain, redness, or  swelling with sores inside the mouth or throat Unusual weakness or fatigue Vomiting This list may not describe all possible side effects. Call your doctor for medical advice about side effects. You may report side effects to FDA at 1-800-FDA-1088. Where should I keep my medication? This medication is given in a hospital or clinic. It will not be stored at home. NOTE: This sheet is a summary. It may not cover all possible information. If you have questions about this medicine, talk to your doctor, pharmacist, or health care provider.  2023 Elsevier/Gold Standard (2007-08-03 00:00:00)

## 2022-07-17 NOTE — Progress Notes (Signed)
Patient seen by Ned Card, NP today  Vitals are within treatment parameters.   Labs reviewed by Ned Card, NP and are within treatment parameters.  Per physician team, patient is ready for treatment and there are NO modifications to the treatment plan.  K 3.0.  Per patient, he has been taking potassium pills every other day.  Ned Card, NP made aware.  Per Lattie Haw, encourage patient to take pills as prescribed (2 tabs once daily) and will recheck level at next appointment.

## 2022-07-18 ENCOUNTER — Other Ambulatory Visit: Payer: Self-pay

## 2022-07-18 DIAGNOSIS — M0579 Rheumatoid arthritis with rheumatoid factor of multiple sites without organ or systems involvement: Secondary | ICD-10-CM

## 2022-07-19 ENCOUNTER — Ambulatory Visit (HOSPITAL_COMMUNITY)
Admission: RE | Admit: 2022-07-19 | Discharge: 2022-07-19 | Disposition: A | Discharge status: Home / Self Care | Payer: 59 | Source: Ambulatory Visit | Attending: Nurse Practitioner | Admitting: Nurse Practitioner

## 2022-07-19 ENCOUNTER — Inpatient Hospital Stay: Payer: 59

## 2022-07-19 DIAGNOSIS — C159 Malignant neoplasm of esophagus, unspecified: Secondary | ICD-10-CM | POA: Insufficient documentation

## 2022-07-19 DIAGNOSIS — K769 Liver disease, unspecified: Secondary | ICD-10-CM | POA: Diagnosis not present

## 2022-07-19 DIAGNOSIS — R918 Other nonspecific abnormal finding of lung field: Secondary | ICD-10-CM | POA: Diagnosis not present

## 2022-07-19 DIAGNOSIS — K8689 Other specified diseases of pancreas: Secondary | ICD-10-CM | POA: Diagnosis not present

## 2022-07-19 DIAGNOSIS — I700 Atherosclerosis of aorta: Secondary | ICD-10-CM | POA: Diagnosis not present

## 2022-07-19 DIAGNOSIS — K5730 Diverticulosis of large intestine without perforation or abscess without bleeding: Secondary | ICD-10-CM | POA: Diagnosis not present

## 2022-07-19 DIAGNOSIS — R590 Localized enlarged lymph nodes: Secondary | ICD-10-CM | POA: Diagnosis not present

## 2022-07-19 MED ORDER — SODIUM CHLORIDE (PF) 0.9 % IJ SOLN
INTRAMUSCULAR | Status: AC
  Filled 2022-07-19: qty 50

## 2022-07-19 MED ORDER — IOHEXOL 9 MG/ML PO SOLN
500.0000 mL | ORAL | Status: AC
  Administered 2022-07-19: 500 mL via ORAL

## 2022-07-19 MED ORDER — IOHEXOL 300 MG/ML  SOLN
100.0000 mL | Freq: Once | INTRAMUSCULAR | Status: AC | PRN
  Administered 2022-07-19: 100 mL via INTRAVENOUS

## 2022-07-24 ENCOUNTER — Inpatient Hospital Stay: Payer: 59 | Admitting: Nurse Practitioner

## 2022-07-24 ENCOUNTER — Inpatient Hospital Stay: Payer: 59

## 2022-07-26 ENCOUNTER — Inpatient Hospital Stay: Payer: 59

## 2022-07-31 ENCOUNTER — Inpatient Hospital Stay: Payer: 59 | Admitting: Nurse Practitioner

## 2022-07-31 ENCOUNTER — Inpatient Hospital Stay: Payer: 59

## 2022-07-31 ENCOUNTER — Telehealth: Payer: Self-pay

## 2022-07-31 NOTE — Telephone Encounter (Signed)
Patient called in can cancel his appointment due to a sore throat. I made Lattie Haw aware and infusion nurse.

## 2022-08-02 ENCOUNTER — Inpatient Hospital Stay: Payer: 59

## 2022-08-04 ENCOUNTER — Inpatient Hospital Stay (HOSPITAL_COMMUNITY)
Admission: EM | Admit: 2022-08-04 | Discharge: 2022-08-09 | DRG: 280 | Disposition: A | Discharge status: Home Health | Payer: 59 | Attending: Internal Medicine | Admitting: Internal Medicine

## 2022-08-04 ENCOUNTER — Encounter (HOSPITAL_COMMUNITY): Payer: Self-pay | Admitting: Cardiology

## 2022-08-04 ENCOUNTER — Emergency Department (HOSPITAL_COMMUNITY): Payer: 59

## 2022-08-04 DIAGNOSIS — Z86711 Personal history of pulmonary embolism: Secondary | ICD-10-CM

## 2022-08-04 DIAGNOSIS — R0602 Shortness of breath: Secondary | ICD-10-CM | POA: Diagnosis not present

## 2022-08-04 DIAGNOSIS — C159 Malignant neoplasm of esophagus, unspecified: Secondary | ICD-10-CM | POA: Diagnosis present

## 2022-08-04 DIAGNOSIS — R55 Syncope and collapse: Secondary | ICD-10-CM | POA: Diagnosis not present

## 2022-08-04 DIAGNOSIS — K219 Gastro-esophageal reflux disease without esophagitis: Secondary | ICD-10-CM | POA: Diagnosis present

## 2022-08-04 DIAGNOSIS — Z951 Presence of aortocoronary bypass graft: Secondary | ICD-10-CM | POA: Diagnosis not present

## 2022-08-04 DIAGNOSIS — I472 Ventricular tachycardia, unspecified: Secondary | ICD-10-CM | POA: Diagnosis not present

## 2022-08-04 DIAGNOSIS — E872 Acidosis, unspecified: Secondary | ICD-10-CM | POA: Diagnosis not present

## 2022-08-04 DIAGNOSIS — N179 Acute kidney failure, unspecified: Secondary | ICD-10-CM | POA: Diagnosis not present

## 2022-08-04 DIAGNOSIS — Z89612 Acquired absence of left leg above knee: Secondary | ICD-10-CM

## 2022-08-04 DIAGNOSIS — M069 Rheumatoid arthritis, unspecified: Secondary | ICD-10-CM | POA: Diagnosis not present

## 2022-08-04 DIAGNOSIS — Z86718 Personal history of other venous thrombosis and embolism: Secondary | ICD-10-CM

## 2022-08-04 DIAGNOSIS — E43 Unspecified severe protein-calorie malnutrition: Secondary | ICD-10-CM | POA: Diagnosis not present

## 2022-08-04 DIAGNOSIS — Z7901 Long term (current) use of anticoagulants: Secondary | ICD-10-CM

## 2022-08-04 DIAGNOSIS — I739 Peripheral vascular disease, unspecified: Secondary | ICD-10-CM

## 2022-08-04 DIAGNOSIS — R42 Dizziness and giddiness: Secondary | ICD-10-CM | POA: Diagnosis not present

## 2022-08-04 DIAGNOSIS — I493 Ventricular premature depolarization: Secondary | ICD-10-CM | POA: Diagnosis present

## 2022-08-04 DIAGNOSIS — Z8616 Personal history of COVID-19: Secondary | ICD-10-CM | POA: Diagnosis not present

## 2022-08-04 DIAGNOSIS — J439 Emphysema, unspecified: Secondary | ICD-10-CM | POA: Diagnosis present

## 2022-08-04 DIAGNOSIS — Z743 Need for continuous supervision: Secondary | ICD-10-CM | POA: Diagnosis not present

## 2022-08-04 DIAGNOSIS — I5022 Chronic systolic (congestive) heart failure: Secondary | ICD-10-CM | POA: Diagnosis not present

## 2022-08-04 DIAGNOSIS — I252 Old myocardial infarction: Secondary | ICD-10-CM

## 2022-08-04 DIAGNOSIS — Z79899 Other long term (current) drug therapy: Secondary | ICD-10-CM | POA: Diagnosis not present

## 2022-08-04 DIAGNOSIS — R404 Transient alteration of awareness: Secondary | ICD-10-CM | POA: Diagnosis not present

## 2022-08-04 DIAGNOSIS — M79641 Pain in right hand: Secondary | ICD-10-CM | POA: Diagnosis present

## 2022-08-04 DIAGNOSIS — M5137 Other intervertebral disc degeneration, lumbosacral region: Secondary | ICD-10-CM | POA: Diagnosis not present

## 2022-08-04 DIAGNOSIS — I25119 Atherosclerotic heart disease of native coronary artery with unspecified angina pectoris: Secondary | ICD-10-CM | POA: Diagnosis not present

## 2022-08-04 DIAGNOSIS — Z888 Allergy status to other drugs, medicaments and biological substances status: Secondary | ICD-10-CM

## 2022-08-04 DIAGNOSIS — D509 Iron deficiency anemia, unspecified: Secondary | ICD-10-CM | POA: Diagnosis not present

## 2022-08-04 DIAGNOSIS — I110 Hypertensive heart disease with heart failure: Secondary | ICD-10-CM | POA: Diagnosis present

## 2022-08-04 DIAGNOSIS — C787 Secondary malignant neoplasm of liver and intrahepatic bile duct: Secondary | ICD-10-CM | POA: Diagnosis present

## 2022-08-04 DIAGNOSIS — I499 Cardiac arrhythmia, unspecified: Secondary | ICD-10-CM | POA: Diagnosis not present

## 2022-08-04 DIAGNOSIS — Z885 Allergy status to narcotic agent status: Secondary | ICD-10-CM

## 2022-08-04 DIAGNOSIS — I255 Ischemic cardiomyopathy: Secondary | ICD-10-CM | POA: Diagnosis present

## 2022-08-04 DIAGNOSIS — Z6821 Body mass index (BMI) 21.0-21.9, adult: Secondary | ICD-10-CM

## 2022-08-04 DIAGNOSIS — I21A1 Myocardial infarction type 2: Secondary | ICD-10-CM | POA: Diagnosis present

## 2022-08-04 DIAGNOSIS — Z8673 Personal history of transient ischemic attack (TIA), and cerebral infarction without residual deficits: Secondary | ICD-10-CM

## 2022-08-04 DIAGNOSIS — E876 Hypokalemia: Secondary | ICD-10-CM

## 2022-08-04 DIAGNOSIS — I639 Cerebral infarction, unspecified: Secondary | ICD-10-CM

## 2022-08-04 DIAGNOSIS — Z7902 Long term (current) use of antithrombotics/antiplatelets: Secondary | ICD-10-CM

## 2022-08-04 DIAGNOSIS — I4729 Other ventricular tachycardia: Principal | ICD-10-CM | POA: Diagnosis present

## 2022-08-04 DIAGNOSIS — I2510 Atherosclerotic heart disease of native coronary artery without angina pectoris: Secondary | ICD-10-CM | POA: Diagnosis not present

## 2022-08-04 DIAGNOSIS — R61 Generalized hyperhidrosis: Secondary | ICD-10-CM | POA: Diagnosis not present

## 2022-08-04 DIAGNOSIS — R000 Tachycardia, unspecified: Secondary | ICD-10-CM | POA: Diagnosis not present

## 2022-08-04 DIAGNOSIS — R0902 Hypoxemia: Secondary | ICD-10-CM | POA: Diagnosis not present

## 2022-08-04 DIAGNOSIS — K5900 Constipation, unspecified: Secondary | ICD-10-CM | POA: Diagnosis not present

## 2022-08-04 DIAGNOSIS — Z87891 Personal history of nicotine dependence: Secondary | ICD-10-CM | POA: Diagnosis not present

## 2022-08-04 DIAGNOSIS — Z8501 Personal history of malignant neoplasm of esophagus: Secondary | ICD-10-CM

## 2022-08-04 DIAGNOSIS — R7989 Other specified abnormal findings of blood chemistry: Secondary | ICD-10-CM | POA: Diagnosis not present

## 2022-08-04 DIAGNOSIS — I2699 Other pulmonary embolism without acute cor pulmonale: Secondary | ICD-10-CM

## 2022-08-04 DIAGNOSIS — R739 Hyperglycemia, unspecified: Secondary | ICD-10-CM | POA: Diagnosis not present

## 2022-08-04 DIAGNOSIS — Z8249 Family history of ischemic heart disease and other diseases of the circulatory system: Secondary | ICD-10-CM

## 2022-08-04 DIAGNOSIS — M0579 Rheumatoid arthritis with rheumatoid factor of multiple sites without organ or systems involvement: Secondary | ICD-10-CM

## 2022-08-04 DIAGNOSIS — I10 Essential (primary) hypertension: Secondary | ICD-10-CM

## 2022-08-04 DIAGNOSIS — E785 Hyperlipidemia, unspecified: Secondary | ICD-10-CM

## 2022-08-04 DIAGNOSIS — R231 Pallor: Secondary | ICD-10-CM | POA: Diagnosis not present

## 2022-08-04 HISTORY — DX: Malignant neoplasm of esophagus, unspecified: C15.9

## 2022-08-04 HISTORY — DX: Cardiac arrest, cause unspecified: I46.9

## 2022-08-04 HISTORY — DX: Other pulmonary embolism without acute cor pulmonale: I26.99

## 2022-08-04 HISTORY — DX: Atherosclerotic heart disease of native coronary artery without angina pectoris: I25.10

## 2022-08-04 LAB — COMPREHENSIVE METABOLIC PANEL
ALT: 41 U/L (ref 0–44)
AST: 73 U/L — ABNORMAL HIGH (ref 15–41)
Albumin: 3.1 g/dL — ABNORMAL LOW (ref 3.5–5.0)
Alkaline Phosphatase: 285 U/L — ABNORMAL HIGH (ref 38–126)
Anion gap: 18 — ABNORMAL HIGH (ref 5–15)
BUN: 11 mg/dL (ref 8–23)
CO2: 18 mmol/L — ABNORMAL LOW (ref 22–32)
Calcium: 8.8 mg/dL — ABNORMAL LOW (ref 8.9–10.3)
Chloride: 104 mmol/L (ref 98–111)
Creatinine, Ser: 1.01 mg/dL (ref 0.61–1.24)
GFR, Estimated: >60 mL/min (ref >60)
Glucose, Bld: 161 mg/dL — ABNORMAL HIGH (ref 70–99)
Potassium: 2.6 mmol/L — CL (ref 3.5–5.1)
Sodium: 140 mmol/L (ref 135–145)
Total Bilirubin: 0.9 mg/dL (ref 0.3–1.2)
Total Protein: 7.2 g/dL (ref 6.5–8.1)

## 2022-08-04 LAB — CBC
HCT: 41.6 % (ref 39.0–52.0)
Hemoglobin: 12.5 g/dL — ABNORMAL LOW (ref 13.0–17.0)
MCH: 28.1 pg (ref 26.0–34.0)
MCHC: 30 g/dL (ref 30.0–36.0)
MCV: 93.5 fL (ref 80.0–100.0)
Platelets: UNDETERMINED 10*3/uL (ref 150–400)
RBC: 4.45 MIL/uL (ref 4.22–5.81)
RDW: 19.3 % — ABNORMAL HIGH (ref 11.5–15.5)
WBC: 4.2 10*3/uL (ref 4.0–10.5)
nRBC: 0 % (ref 0.0–0.2)

## 2022-08-04 LAB — TROPONIN I (HIGH SENSITIVITY): Troponin I (High Sensitivity): 14 ng/L (ref <18)

## 2022-08-04 LAB — MAGNESIUM: Magnesium: 1.7 mg/dL (ref 1.7–2.4)

## 2022-08-04 LAB — TSH: TSH: 2.959 u[IU]/mL (ref 0.350–4.500)

## 2022-08-04 LAB — CBG MONITORING, ED: Glucose-Capillary: 155 mg/dL — ABNORMAL HIGH (ref 70–99)

## 2022-08-04 MED ORDER — IOHEXOL 350 MG/ML SOLN
75.0000 mL | Freq: Once | INTRAVENOUS | Status: AC | PRN
  Administered 2022-08-04: 75 mL via INTRAVENOUS

## 2022-08-04 MED ORDER — OXYCODONE-ACETAMINOPHEN 5-325 MG PO TABS
1.0000 | ORAL_TABLET | Freq: Four times a day (QID) | ORAL | Status: DC | PRN
  Administered 2022-08-04 – 2022-08-05 (×2): 1 via ORAL
  Filled 2022-08-04 (×2): qty 1

## 2022-08-04 MED ORDER — ONDANSETRON HCL 4 MG/2ML IJ SOLN
4.0000 mg | Freq: Once | INTRAMUSCULAR | Status: AC
  Administered 2022-08-04: 4 mg via INTRAVENOUS
  Filled 2022-08-04: qty 2

## 2022-08-04 MED ORDER — MAGNESIUM SULFATE 2 GM/50ML IV SOLN
2.0000 g | Freq: Once | INTRAVENOUS | Status: AC
  Administered 2022-08-04: 2 g via INTRAVENOUS
  Filled 2022-08-04: qty 50

## 2022-08-04 MED ORDER — MELOXICAM 7.5 MG PO TABS
7.5000 mg | ORAL_TABLET | Freq: Every day | ORAL | Status: DC
  Administered 2022-08-05 – 2022-08-09 (×5): 7.5 mg via ORAL
  Filled 2022-08-04 (×6): qty 1

## 2022-08-04 MED ORDER — AMIODARONE LOAD VIA INFUSION
150.0000 mg | Freq: Once | INTRAVENOUS | Status: AC
  Administered 2022-08-04: 150 mg via INTRAVENOUS
  Filled 2022-08-04: qty 83.34

## 2022-08-04 MED ORDER — AMIODARONE HCL IN DEXTROSE 360-4.14 MG/200ML-% IV SOLN
60.0000 mg/h | INTRAVENOUS | Status: AC
  Administered 2022-08-04 (×2): 60 mg/h via INTRAVENOUS
  Filled 2022-08-04 (×2): qty 200

## 2022-08-04 MED ORDER — ENOXAPARIN SODIUM 40 MG/0.4ML IJ SOSY
40.0000 mg | PREFILLED_SYRINGE | INTRAMUSCULAR | Status: DC
  Administered 2022-08-04: 40 mg via SUBCUTANEOUS
  Filled 2022-08-04: qty 0.4

## 2022-08-04 MED ORDER — POTASSIUM CHLORIDE CRYS ER 20 MEQ PO TBCR
40.0000 meq | EXTENDED_RELEASE_TABLET | Freq: Once | ORAL | Status: AC
  Administered 2022-08-04: 40 meq via ORAL
  Filled 2022-08-04: qty 2

## 2022-08-04 MED ORDER — AMIODARONE HCL IN DEXTROSE 360-4.14 MG/200ML-% IV SOLN
30.0000 mg/h | INTRAVENOUS | Status: DC
  Administered 2022-08-05 – 2022-08-07 (×7): 30 mg/h via INTRAVENOUS
  Filled 2022-08-04 (×6): qty 200

## 2022-08-04 MED ORDER — POTASSIUM CHLORIDE 10 MEQ/100ML IV SOLN
10.0000 meq | INTRAVENOUS | Status: AC
  Administered 2022-08-04 – 2022-08-05 (×4): 10 meq via INTRAVENOUS
  Filled 2022-08-04 (×4): qty 100

## 2022-08-04 MED ORDER — MAGNESIUM SULFATE 2 GM/50ML IV SOLN: 2.0000 g | Freq: Once | INTRAVENOUS | Status: DC

## 2022-08-04 NOTE — H&P (Addendum)
Date: 08/05/2022               Patient Name:  [name] [name] MRN: [id number]  DOB: [birth year] Age / Sex: 62 y.o., male   PCP: Angelique Blonder, DO         Medical Service: Internal Medicine Teaching Service         Attending Physician: Dr. Jimmye Norman, Elaina Pattee, MD      First Contact: Dr. Drucie Opitz, MD Pager [phone]    Second Contact: Dr. Brett Canales, MD Pager [phone]         After Hours (After 5p/  First Contact Pager: [phone]  weekends / holidays): Second Contact Pager: [phone]   SUBJECTIVE  Chief Complaint: syncope and shortness of breath  History of Present Illness: [name] [name] is a 62 y.o. male with a pertinent PMH of metastatic adenocarcinoma likely esophageal primary, out of hospital vfib arrest 8/23 2/2 to inferior MI, prior CVA occipital lobe, PAD s/p left AKA 2021, RA, who presents to [facility] with syncope secondary to ventricular tachycardia.  For the last 2 weeks he has been nauseated and vomiting since last chemotherapy.  He has difficulty with keeping fluids down. This morning he woke up and felt extremely weak. He got up from wheel chair to transfer to use bathroom when he felt short of breath. He thinks that he passed out during this and was in wheelchair when he came back to. His roommate called EMS. EMS found patient to be in ventricular tachycardia requiring single shock with conversion to sinus rhythm.  He has a history of ventricular fib arrest in 8/23 during which he underwent 35 minutes of CPR in the field. He was intubated and admitted to ICU requiring vasopressors. Cardiac cath showed showed 30% stenosis of circumflex and 100% in RCA which was medically managed. CT during that admission showed liver mass, biopsy with adenocarcinoma and esophageal mass likely primary. He started chemotherapy 10/23.  In the ED, the patient was tachycardic to 110s with RR of 30. He was placed on 2L [place]. Initial evaluation showed K 2.6, Mg 1.7, Bicarb 18 with AG 18. CXR showed no  active disease and CTA showed no PE and redemonstrated Esophageal mass with hepatic metastases and small right pulmonary nodules.   EKG showed sinus tachycardia with sinus tachycardia, QTC of 484.  Cardiology was consulted by EDP and patient started on amiodarone drip and IMTS consulted for admission.  Medications: Chlorthalidone 25 mg Potassium 20 meq qd Imdur 30 mg qd Clopidogrel 75 mg Apixaban 5 mg BID Gabapentin 100 mg qd BID Amlodipine 10 mg qd Atorvastatin 40 mg qd Meloxicam 7.5 mg qd Metoprolol 25 mg qd Methotrexate 7.5 mg weekly Folic acid   Past Medical History:  Past Medical History:  Diagnosis Date   Acute respiratory failure ([place]) 01/31/2022   Bilateral shoulder pain 08/20/2012   Bite wound of left hand 09/27/2021   CAD (coronary artery disease)    Occluded nondominent RCA managed medically - August 2023   Cardiac arrest [facility])    [facility] VF - August 2023   Community acquired pneumonia 11/07/2011   Critical limb ischemia with history of revascularization of same extremity ([place]) 04/28/2020   DDD (degenerative disc disease), lumbosacral     and grade 2 slip   DVT (deep venous thrombosis) ([place]) 01/31/2022   Esophageal cancer ([place])    Adenocarcinoma with metastasis   GERD (gastroesophageal reflux disease)    History of anemia    History of COVID-19  05/10/2019   History of critical lower limb ischemia 02/19/2019   Hypertension    Impingement syndrome of shoulder region    Ischemic ulcer of toe of left foot (HCC)    Liver mass    Low back pain without sciatica    Lupus (HCC)    Multiple fractures of ribs, bilateral, due to CPR trauma    Onychomycosis    Paronychia of great toe    Peripheral vascular disease (HCC)    Pressure injury of skin    Pulmonary embolus (HCC)    RA (rheumatoid arthritis) ([place])    Rotator cuff tear 11/04/2014   Sternal fracture    Stroke [facility])     Social:  Lives - with friends/ roommates, his sister lives in [place] but they have not  talked in 7 years. Occupation- disabled due to rheumatoid Support - roommates are a married couple who help him with iADLs including taking to infusion center, cleaning, and cooking Level of function - independent in ADLs PCP - [facility], Dr. Markus Jarvis Substance use - denies alcohol and tobacco use  Family History: Family History  Problem Relation Age of Onset   Heart attack Father 60   Alcohol abuse Father    Aneurysm Mother    Stroke Neg Hx    Cancer Neg Hx     Allergies: Allergies as of 08/04/2022 - Review Complete 08/04/2022  Allergen Reaction Noted   Dilaudid [hydromorphone hcl] Rash 05/08/2020   Ramipril Rash and Other (See Comments) 06/23/2011   Tramadol Itching and Rash 06/23/2011    Review of Systems: A complete ROS was negative except as per HPI.   OBJECTIVE:  Physical Exam: Blood pressure 103/71, pulse 73, temperature 98.3 F (36.8 C), temperature source Oral, resp. rate 18, height 5' 7"$  (1.702 m), weight 62 kg, SpO2 96 %. Constitutional: in no acute distress, appears older than stated age Cardiovascular: regular rate and rhythm, no m/r/g, defibrillator pads in place Pulmonary/Chest: Port in place, normal work of breathing on room air, lungs clear to auscultation bilaterally Abdominal: soft, non-tender, non-distended MSK: severe ulnar deviation to MCPs bilaterally with tenderness to palpation, left residual limb appears well healed with no erythema, right leg with scratches with no erythema to lower extremity Skin: warm and dry   Pertinent Labs: CBC    Component Value Date/Time   WBC 4.2 08/04/2022 1824   RBC 4.45 08/04/2022 1824   HGB 12.5 (L) 08/04/2022 1824   HGB 10.8 (L) 07/17/2022 0943   HGB 11.4 (L) 06/15/2022 1458   HCT 41.6 08/04/2022 1824   HCT 36.1 (L) 06/15/2022 1458   PLT PLATELET CLUMPS NOTED ON SMEAR, UNABLE TO ESTIMATE 08/04/2022 1824   PLT 138 (L) 07/17/2022 0943   PLT 133 (L) 06/15/2022 1458   MCV 93.5 08/04/2022 1824   MCV 84 06/15/2022 1458    MCH 28.1 08/04/2022 1824   MCHC 30.0 08/04/2022 1824   RDW 19.3 (H) 08/04/2022 1824   RDW 19.4 (H) 06/15/2022 1458   LYMPHSABS 1.0 07/17/2022 0943   LYMPHSABS 1.1 06/15/2022 1458   MONOABS 0.6 07/17/2022 0943   EOSABS 0.3 07/17/2022 0943   EOSABS 0.2 06/15/2022 1458   BASOSABS 0.0 07/17/2022 0943   BASOSABS 0.1 06/15/2022 1458     CMP     Component Value Date/Time   NA 138 08/05/2022 0055   NA 140 06/15/2022 1458   K 3.0 (L) 08/05/2022 0055   CL 103 08/05/2022 0055   CO2 25 08/05/2022 0055   GLUCOSE 141 (  H) 08/05/2022 0055   BUN 14 08/05/2022 0055   BUN 20 06/15/2022 1458   CREATININE 0.72 08/05/2022 0055   CREATININE 0.82 07/17/2022 0943   CREATININE 0.80 11/27/2012 0956   CALCIUM 8.0 (L) 08/05/2022 0055   PROT 7.2 08/04/2022 1824   PROT 6.9 06/15/2022 1458   ALBUMIN 3.1 (L) 08/04/2022 1824   ALBUMIN 3.3 (L) 06/15/2022 1458   AST 73 (H) 08/04/2022 1824   AST 34 07/17/2022 0943   ALT 41 08/04/2022 1824   ALT 25 07/17/2022 0943   ALKPHOS 285 (H) 08/04/2022 1824   BILITOT 0.9 08/04/2022 1824   BILITOT 0.3 07/17/2022 0943   GFRNONAA >60 08/05/2022 0055   GFRNONAA >60 07/17/2022 0943   GFRNONAA >89 11/27/2012 0956   GFRAA >60 01/24/2020 0202   GFRAA >89 11/27/2012 0956    Pertinent Imaging: CXR: No active disease  CT angio PE: 1. No acute intrathoracic pathology. No CT evidence of pulmonary artery embolus. 2. Gastroesophageal junction mass and multiple hepatic metastatic disease in keeping with history of malignancy. 3. Small right lung base pulmonary nodules as seen on the prior CT. 4.  Aortic Atherosclerosis (ICD10-I70.0).  EKG: personally reviewed my interpretation is sinus tachycardia with prolonged Qtc at 484  ASSESSMENT & PLAN:  Assessment: Principal Problem:   Ventricular tachycardia (HCC)   AZARI KADEN is a 62 y.o. with pertinent PMH of metastatic adenocarcinoma likely esophageal primary, out of hospital vfib arrest 8/23 2/2 to inferior MI,  prior CVA occipital lobe, PAD s/p left AKA 2021, RA who presented with syncope due to ventricular tachycardia requiring cardioversion and admit for ventricular tachycardia on hospital day 1.   Plan:  Sustained ventricular tachycardia s/p electrical cardioversion by EMS History of vfib arrest in setting of Inferior MI 8/23 CAD Elevated Troponins Patient present with shortness of breath and likely syncope in setting of sustained ventricular tachycardia. He denies chest pain throughout episode and currently. EKG on arrival showed sinus tachycardia with PVC. Initial troponin at 14. Last echo 11/23 showed EF 50-55% with low normal function with no regional wall motion abnormalities with moderately enlarged right ventricle. Left heart cath showed 30% stenosis of circumflex and 100% in RCA which was medically managed. Ventricular tachycardia likely multifactorial with known cardiac disease complicated by electrolyte derangements in setting of n/v with chemo and chlorthalidone. Cardiology evaluated patient and amiodarone was started. TSH within normal limits at 2.9. I have ordered an echo, initial troponin at 14 increased to 108 on repeat. Patient continues to deny current chest pain, nausea, and shortness of breath. Repeat EKG without ischemic changes. Cardiology noted that if clear evidence of ischemic disease, could consider assessment for revascularization. -Appreciate cardiology recommendations -trend troponin -IV amiodarone ggt -replete electrolytes, keep K>4 and Mg >2 -repeat echo -Imdur 30 mg qd -hold metoprolol succinate 25 mg  Hypokalemia Hypomagnesemia Prolonged Qtc For the last 2 weeks, Mr. Capo has had difficulty with tolerating PO intake due to nausea and vomiting since last chemo infusion 2 weeks ago. He takes compazine for this but ran out and needs to pick up refill. K improved from 2.6 to 3 with supplementation. Magnesium at 1.7. Hypokalemia has been a recurrent problem for him  requiring supplementation in infusion center and he is taking 20 mEq at home daily to which he endorses adherence.  Qtc 484, will plan to repeat EKG in AM. Would use Qtc prolonging anti-emetics judicially in setting of cardiac history versus consider alternative agents such as ativan. -Trend BMP and Mg -  repeat EKG following repletion of electrolytes  Metastatic adenocarcinoma, likely esophagus Malnutrition He was found to have liver mass 8/23 on CT after cardiac arrest and biopsy consistent with adenocarcinoma, PET showed esophageal mass. He was not able to undergo EGD in setting of cardiac arrest. He started chemotherapy 10/23 and is on cycle 7 last dose 1/22. Overall I am concerned about his ability to tolerate chemotherapy moving forward. If electrolyte derangements decreasing threshold for vtach/vfib then more aggressive symptom management is needed if patient's goals are to continue with chemotherapy. He would benefit from palliative referral. This is his 61th ED/ admission since 8/23. - will continue home pain medications with Oxycodone-Acetaminophen 5-325 q 6 hr PRN, has pain contract with [place]. PDMP reviewed. -gabapentin 100 mg BID, patient mentions he does not find this medication helpful. Will hold off on restarting. -consider adding ensure shakes as tolerated -PT/OT  AGMA Bicarb initially at 18 with AG of 18. Differentials include starvation ketosis in setting of not eating much in several days from nausea and vomiting versus lactic acidosis with cardioversion.  Repeat labs improved to Bicarb 25 with AG 10. -Trend BMP  AKI Creatinine elevated at 1, baseline creatinine around 0.7. Likely pre-renal. Repeat labs showed creatinine at 0.7. -trend BMP -Strict I and Os  HTN Home medications include Imdur 30 mg, amlodipine 10 mg qd, metoprolol 25 mg qd, and chlorthalidone. From 12/23 office visit, patient has been having dizziness and increasing frailty. Note indicates discontinuing  chlorthalidone however patient continues to have this in medications and is taking daily. -restart imdur 30 mg -holding amlodipine, metoprolol, and chlorthalidone  Elevated LFTs. AST at 73 with alk phos of 285. This is likely in the setting of known hepatic lesions. -trend CMP  History of DVT/ PE on apixaban Bilateral lower extremity DVT 7/23 and PE 8/23. In setting of malignancy he will need to continue anticoagulation. -restart eliquis 5 mg BID  PAD History of left AKA in 2021 Has prosthsis and wheelchair at home. He has not been able to use prosthesis or ambulate since starting chemotherapy due to worsening weakness.  -restart clopidogrel 75 mg and atorvastatin 40 mg  Rheumatoid arthritis Patient had significant ulnar deviation to MCPs bilaterally with tenderness to palpation. He was started on methotrexate, folic acid, and meloxicam by PCP 11/23. Prior history of taking DMARDs. Unsure if meloxicam is good long term medication in setting of patient taking eliquis and clopidegrel. -restart meloxicam -holding home medication of methotrexate 7.5 mg weekly -restart folic acid, patient did not have this in medication bag and denied taking vitamins. Would revisit prior to discharge.   Normocytic anemia Hgb at 12.5 which above baseline of 10-11 with MCV of 93.5.  02/11/2022-ferritin 151, iron 13, percent saturation 5%. -trend CBC  COPD Emphysema on CT, no recent PFTs. Last PFT 2013: FEV1/FVC 71% -> 81% post bronchodilator. SVC 2.7L, DLCO reduced but corrected for the inhaled alveolar volume. These results were consistent with mixed obstructive and restrictive disease. Patient is on room air at baseline and no home medications or inhalers -CTM  Best Practice: Diet: Clear liquid diet, advance as tolerate VTE: Eliquis 5 mg BID for afib Code: Full Status: Inpatient with expected length of stay greater than 2 midnights. Anticipated Discharge Location: SNF Barriers to Discharge:  Further  evaluation of ventricular tachycardia  Signature: Lemarcus Baggerly M. Natacha Jepsen, D.O.  Internal Medicine Resident, PGY-2 [facility]  Pager: [phone] 3:06 AM, 08/05/2022   Please contact the on call pager after 5  pm and on weekends at [phone].

## 2022-08-04 NOTE — ED Notes (Signed)
Placed on 2 LPM [place] for comfort as pt is tachypneic in the 30s. O2 maintains at 95-98% with current settings.

## 2022-08-04 NOTE — Consult Note (Signed)
Cardiology Consultation:   Patient ID: [name] [name]; [id number]; 1960/12/22   Admit date: 08/04/2022 Date of Consult: 08/04/2022  Primary Care Provider: Angelique Blonder, DO Primary Cardiologist: [facility] (formerly Dr. Meda Coffee 2018, more recently seen in consultation by Dr. Gwenlyn Found August 2023) Primary Electrophysiologist: None   History of Present Illness:   [name] [name] is a medically complex 62 year old male with significant past medical history as detailed below now presenting from home via EMS.  He states that over the last few days he has had intermittent episodes of dizziness, no chest pain or syncope.  Today he got up using his wheelchair to go to the bathroom and when he got there suddenly felt more dizzy and short of breath, not entirely clear that he had frank syncope but he indicates that he was not completely aware of the situation the entire time.  Friends in his house summoned EMS and he was reportedly found to be in ventricular tachycardia requiring a single shock with conversion to sinus rhythm.  On evaluation in the ER at [facility] he states that he feels better, currently in sinus rhythm with frequent ventricular ectopy, no active chest pain or shortness of breath at rest.  He was admitted to the hospital in August 2023 following a successfully resuscitated out of hospital ventricular fibrillation arrest.  He was ultimately taken to the cardiac catheterization lab and found to have an occluded small nondominant RCA that was managed medically, otherwise no major obstructive stenosis.  LVEF was 50 to 55%.  He was followed by our cardiology service, although has not had outpatient follow-up since that time.  He is currently undergoing active chemotherapy for treatment of metastatic adenocarcinoma of the esophagus, had cycle 7 including oxaliplatin and trastuzumab on 07/17/2022, 5-FU was held.  I note that he has been hypokalemic on last few outpatient blood checks,  reportedly on potassium chloride 20 mL daily at home.  His present outpatient cardiac regimen includes Plavix, Imdur, Toprol-XL, Norvasc, and Lipitor.  He is also on Eliquis with history of DVT and pulmonary embolus diagnosed last year.  He states that he has been taking his medications regularly.  Past Medical History:  Diagnosis Date   Acute respiratory failure ([place]) 01/31/2022   Aortic valve mass 12/30/2015   Bilateral shoulder pain 08/20/2012   Bite wound of left hand 09/27/2021   CAD (coronary artery disease)    Occluded nondominent RCA managed medically - August 2023   Cardiac arrest [facility])    OO[facility] VF - August 2023   Community acquired pneumonia 11/07/2011   Critical limb ischemia with history of revascularization of same extremity ([place]) 04/28/2020   DDD (degenerative disc disease), lumbosacral     and grade 2 slip   DVT (deep venous thrombosis) ([place]) 01/31/2022   Esophageal cancer (HCC)    Adenocarcinoma with metastasis   GERD (gastroesophageal reflux disease)    History of anemia    History of COVID-19 05/10/2019   History of critical lower limb ischemia 02/19/2019   Hypertension    Impingement syndrome of shoulder region    Ischemic ulcer of toe of left foot (HCC)    Liver mass    Low back pain without sciatica    Lupus ([place])    Multiple fractures of ribs, bilateral, due to CPR trauma    Onychomycosis    Paronychia of great toe    Peripheral vascular disease ([place])    Pressure injury of skin    Pulmonary embolus ([place])  RA (rheumatoid arthritis) (HCC)    Rotator cuff tear 11/04/2014   Sternal fracture    Stroke [facility])     Past Surgical History:  Procedure Laterality Date   ABDOMINAL AORTAGRAM  02/20/2019   ABDOMINAL AORTOGRAM W/LOWER EXTREMITY N/A 02/20/2019   Procedure: ABDOMINAL AORTOGRAM W/LOWER EXTREMITY;  Surgeon: Marty Heck, MD;  Location: [place] CV [facility];  Service: Cardiovascular;  Laterality: N/A;   ACHILLES TENDON SURGERY Bilateral     AMPUTATION Left 05/11/2020   Procedure: LEFT ABOVE KNEE AMPUTATION;  Surgeon: Angelia Mould, MD;  Location: [place];  Service: Vascular;  Laterality: Left;   ENDARTERECTOMY POPLITEAL Left 05/01/2020   Procedure: ENDARTERECTOMY POPLITEAL;  Surgeon: Waynetta Sandy, MD;  Location: [place];  Service: Vascular;  Laterality: Left;   FASCIOTOMY Left 05/01/2020   Procedure: FASCIOTOMY;  Surgeon: Waynetta Sandy, MD;  Location: [place];  Service: Vascular;  Laterality: Left;   FEMORAL-POPLITEAL BYPASS GRAFT Left 02/26/2019   Procedure: BYPASS GRAFT FEMORAL-POPLITEAL ARTERY LEFT LEG USING 73m PROPATEN GRAFT;  Surgeon: BSerafina Mitchell MD;  [place]  Service: Vascular;  Laterality: Left;   INTRAOPERATIVE ARTERIOGRAM Left 01/22/2020   Procedure: INTRA OPERATIVE ARTERIOGRAM with administration of thrombolyics in Left femoral to popliteal bypass.;  Surgeon: CMarty Heck MD;  [place]  Service: Vascular;  Laterality: Left;   IR IMAGING GUIDED PORT INSERTION  03/07/2022   LEFT HEART CATH AND CORONARY ANGIOGRAPHY N/A 03/15/2017   Procedure: LEFT HEART CATH AND CORONARY ANGIOGRAPHY;  Surgeon: ENelva Bush MD;  [place] [facility];  Service: Cardiovascular;  Laterality: N/A;   LEFT HEART CATH AND CORONARY ANGIOGRAPHY N/A 02/06/2022   Procedure: LEFT HEART CATH AND CORONARY ANGIOGRAPHY;  Surgeon: JMartinique Peter M, MD;  [place] [facility];  Service: Cardiovascular;  Laterality: N/A;   LOWER EXTREMITY INTERVENTION Left 04/29/2020   Procedure: LOWER EXTREMITY INTERVENTION- LYSIS;  Surgeon: CMarty Heck MD;  [place] [facility];  Service: Cardiovascular;  Laterality: Left;   OSTEOTOMY AND ULNAR SHORTENING Right 07/22/2002   PATCH ANGIOPLASTY Left 05/01/2020   Procedure: PATCH ANGIOPLASTY USING HEMASHIELD PLATINUM FINESSE PATCH;  Surgeon: CWaynetta Sandy MD;  [place]  Service: Vascular;  Laterality: Left;   PERIPHERAL  VASCULAR ATHERECTOMY  01/23/2020   Procedure: PERIPHERAL VASCULAR ATHERECTOMY;  Surgeon: CMarty Heck MD;  [place];  Service: Cardiovascular;;   PERIPHERAL VASCULAR BALLOON ANGIOPLASTY  01/23/2020   Procedure: PERIPHERAL VASCULAR BALLOON ANGIOPLASTY;  Surgeon: CMarty Heck MD;  [place];  Service: Cardiovascular;;   PERIPHERAL VASCULAR INTERVENTION Left 04/30/2020   Procedure: PERIPHERAL VASCULAR INTERVENTION;  Surgeon: HCherre Robins MD;  [place] [facility];  Service: Cardiovascular;  Laterality: Left;   PERIPHERAL VASCULAR THROMBECTOMY N/A 01/23/2020   Procedure: lysis recheck;  Surgeon: CMarty Heck MD;  [place];  Service: Cardiovascular;  Laterality: N/A;  + [place]    PERIPHERAL VASCULAR THROMBECTOMY N/A 04/30/2020   Procedure: Lysis Recheck;  Surgeon: HCherre Robins MD;  [place] [facility];  Service: Cardiovascular;  Laterality: N/A;   SHOULDER ARTHROSCOPY WITH BICEPSTENOTOMY Right 03/11/2015   Procedure: SHOULDER ARTHROSCOPY WITH BICEPSTENOTOMY;  Surgeon: DNinetta Lights MD;  Location: [place]  Service: Orthopedics;  Laterality: Right;   SHOULDER ARTHROSCOPY WITH DISTAL CLAVICLE RESECTION Left 11/19/2014   Procedure: SHOULDER ARTHROSCOPY WITH DISTAL CLAVICLE RESECTION;  Surgeon: DKathryne Hitch MD;  [place];  Service: Orthopedics;  Laterality: Left;   SHOULDER ARTHROSCOPY WITH ROTATOR CUFF REPAIR AND SUBACROMIAL DECOMPRESSION Left 11/19/2014   Procedure: LEFT SHOULDER ARTHROSCOPY, DEBRIDEMENT DISTAL CLAVICLE EXCISION, ACROMIOPLASTY WITH ROTATOR CUFF REPAIR ;  Surgeon: Kathryne Hitch, MD;  Location: [place];  Service: Orthopedics;  Laterality: Left;   THROMBECTOMY OF BYPASS GRAFT FEMORAL- POPLITEAL ARTERY Left 05/01/2020   Procedure: THROMBECTOMY OF LOWER EXTREMITY;  Surgeon: Waynetta Sandy, MD;  Location: [place];   Service: Vascular;  Laterality: Left;   TRANSFORAMINAL LUMBAR INTERBODY FUSION (TLIF) WITH PEDICLE SCREW FIXATION 1 LEVEL N/A 01/03/2018   Procedure: TRANSFORAMINAL LUMBAR INTERBODY FUSION (TLIF) LUMBAR FIVE-SACRAL ONE;  Surgeon: Melina Schools, MD;  Location: [place];  Service: Orthopedics;  Laterality: N/A;   ULTRASOUND GUIDANCE FOR VASCULAR ACCESS Right 01/22/2020   Procedure: ULTRASOUND GUIDANCE FOR VASCULAR ACCESS Right Common Femoral Artery.;  Surgeon: Marty Heck, MD;  Location: [place];  Service: Vascular;  Laterality: Right;   VIDEO ASSISTED THORACOSCOPY (VATS)/DECORTICATION Right 11/21/2011   drainage of empyema     Outpatient Medications: No current facility-administered medications on file prior to encounter.   Current Outpatient Medications on File Prior to Encounter  Medication Sig Dispense Refill   albuterol (VENTOLIN HFA) 108 (90 Base) MCG/ACT inhaler Inhale 2 puffs into the lungs every 6 (six) hours as needed for wheezing or shortness of breath. 8 g 2   amLODipine (NORVASC) 10 MG tablet Take 1 tablet (10 mg total) by mouth daily. 90 tablet 3   apixaban (ELIQUIS) 5 MG TABS tablet Take 1 tablet (5 mg total) by mouth 2 (two) times daily. 60 tablet 11   atorvastatin (LIPITOR) 40 MG tablet Take 1 tablet (40 mg total) by mouth daily at 6 PM. 90 tablet 3   clopidogrel (PLAVIX) 75 MG tablet Take 1 tablet (75 mg total) by mouth daily. 90 tablet 3   DULoxetine (CYMBALTA) 30 MG capsule Take 30 mg by mouth daily.     folic acid (FOLVITE) 1 MG tablet Take 1 tablet (1 mg total) by mouth daily. 90 tablet 3   gabapentin (NEURONTIN) 100 MG capsule Take 1 capsule (100 mg total) by mouth See admin instructions. Daily or twice daily if tolerated. 90 capsule 0   isosorbide mononitrate (IMDUR) 30 MG 24 hr tablet Take 1 tablet (30 mg total) by mouth daily. 90 tablet 0   lidocaine-prilocaine (EMLA) cream Apply 1 Application topically as directed. Apply to port site 2 hours prior to stick and cover  with Press-and-Seal to numb port before access 30 g 1   meloxicam (MOBIC) 7.5 MG tablet TAKE 1 TABLET (7.5 MG TOTAL) BY MOUTH DAILY AS NEEDED FOR PAIN. 15 tablet 0   methotrexate (RHEUMATREX) 7.5 MG tablet Take 1 tablet (7.5 mg total) by mouth once a week. Caution:Chemotherapy. Protect from light. 4 tablet 3   metoprolol succinate (TOPROL-XL) 25 MG 24 hr tablet Take 1 tablet (25 mg total) by mouth daily. 90 tablet 3   ondansetron (ZOFRAN) 8 MG tablet TAKE 1 TABLET(8 MG) BY MOUTH EVERY 8 HOURS AS NEEDED FOR NAUSEA OR VOMITING 20 tablet 0   oxyCODONE-acetaminophen (PERCOCET/ROXICET) 5-325 MG tablet Take 1 tablet by mouth every 6 (six) hours as needed for severe pain. 120 tablet 0   potassium chloride (KLOR-CON M) 10 MEQ tablet TAKE 2 TABLETS(20 MEQ) BY MOUTH DAILY 180 tablet 0   prochlorperazine (COMPAZINE) 10 MG tablet Take 1 tablet (10 mg total) by mouth every 8 (eight) hours as  needed for refractory nausea / vomiting. TAKE 1 TABLET(10 MG) BY MOUTH EVERY 6 HOURS AS NEEDED FOR NAUSEA OR VOMITING Strength: 10 mg 60 tablet 0     Allergies:    Allergies  Allergen Reactions   Dilaudid [Hydromorphone Hcl] Rash   Ramipril Rash and Other (See Comments)    Per patient on R arm and leg only; no angioedema   Tramadol Itching and Rash    Social History:   Social History   Socioeconomic History   Marital status: Single    Spouse name: Not on file   Number of children: 0   Years of education: Not on file   Highest education level: Not on file  Occupational History   Occupation: Disabled    Comment: 2/2 RA  Tobacco Use   Smoking status: Former    Packs/day: 0.25    Years: 20.00    Total pack years: 5.00    Types: Cigarettes    Quit date: 02/19/2019    Years since quitting: 3.4   Smokeless tobacco: Never   Tobacco comments:    Patient stated he smokes a couple day and is attempting to quit smoking  Vaping Use   Vaping Use: Never used  Substance and Sexual Activity   Alcohol use: No     Alcohol/week: 0.0 standard drinks of alcohol    Comment: sober 1998   Drug use: No   Sexual activity: Not on file  Other Topics Concern   Not on file  Social History Narrative   On disability since 1992, used to work with city of [place]. Quit drinking in 1990.      Current Social History 11/05/2019        Patient lives by himself most of the time. Sometimes girlfriend's son stays with him in a one level home. There are 3 steps with handrail up to the entrance the patient uses.       Patient's method of transportation is personal truck. This was recently vandalized and patient doesn't have funds to repair at this time.      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, BSN, RN-BC    Social Determinants of Health   Financial Resource Strain: Low Risk  (05/05/2022)   Overall Financial Resource Strain (CARDIA)    Difficulty of Paying Living Expenses: Not hard at all  Food Insecurity: No Food Insecurity (01/27/2022)   Hunger Vital Sign    Worried About Running Out of Food in the Last Year: Never true    [place] in the Last Year: Never true  Transportation Needs: No Transportation Needs (05/05/2022)   PRAPARE - Hydrologist (Medical): No    Lack of Transportation (Non-Medical): No  Physical Activity: Not on file  Stress: Not on file  Social Connections: Not on file  Intimate Partner Violence: Not At Risk (04/04/2022)   Humiliation, Afraid, Rape, and  Kick questionnaire    Fear of Current or Ex-Partner: No    Emotionally Abused:  No    Physically Abused: No    Sexually Abused: No    Family History:   The patient's family history includes Alcohol abuse in his father; Aneurysm in his mother; Heart attack (age of onset: 59) in his father. There is no history of Stroke or Cancer.  ROS:  Please see the history of present illness.  All other ROS reviewed and negative.     Physical Exam/Data:   Vitals:   08/04/22 1815 08/04/22 1817 08/04/22 1818 08/04/22 1830  BP: 106/65 106/65  104/65  Pulse: (!) 108 99  (!) 105  Resp: 20 15  (!) 35  Temp:  98 F (36.7 C)    TempSrc:  Oral    SpO2: 97% 99%  95%  Weight:   62 kg   Height:   5' 7"$  (1.702 m)    No intake or output data in the 24 hours ending 08/04/22 1956 Filed Weights   08/04/22 1818  Weight: 62 kg   Body mass index is 21.41 kg/m.   Gen: Chronically ill-appearing male in no acute distress. HEENT: Conjunctiva and lids normal. Neck: Supple, no elevated JVP or carotid bruits. Lungs: Decreased breath sounds without active wheezing. Cardiac: Distant RRR, no obvious gallop, no significant murmur or rub. Abdomen: Soft, nontender, bowel sounds present. Extremities: Status post left AKA. Skin: Warm and dry. Musculoskeletal: No kyphosis. Neuropsychiatric: Alert and oriented x3, affect grossly appropriate.  EKG:  An ECG dated 08/04/2022 was personally reviewed today and demonstrated:  Sinus tachycardia with PVCs, poor R wave progression rule out old anterolateral infarct pattern, nonspecific ST changes, probable QT prolongation (difficult to measure).  Telemetry:  I personally reviewed telemetry which shows sinus rhythm with frequent ventricular ectopy.  Relevant CV Studies:  Cardiac catheterization 02/06/2022:   Dist Cx lesion is 30% stenosed.   Prox RCA lesion is 100% stenosed.   LV end diastolic pressure is normal.   Single vessel occlusive CAD. Occlusion of proximal nondominant RCA which is new since 2018.  Normal LVEDP   Plan: medical  therapy  Echocardiogram 05/01/2022:  1. Left ventricular ejection fraction, by estimation, is 50 to 55%. The  left ventricle has low normal function. The left ventricle has no regional  wall motion abnormalities. There is mild left ventricular hypertrophy.  Left ventricular diastolic  parameters are indeterminate.   2. Right ventricular systolic function is moderately reduced. The right  ventricular size is moderately enlarged. There is mildly elevated  pulmonary artery systolic pressure. The estimated right ventricular  systolic pressure is 99991111 mmHg.   3. Right atrial size was mildly dilated.   4. The mitral valve is normal in structure. No evidence of mitral valve  regurgitation.   5. Tricuspid valve regurgitation is moderate.   6. The aortic valve was not well visualized. Aortic valve regurgitation  is not visualized. No aortic stenosis is present.   7. The inferior vena cava is dilated in size with >50% respiratory  variability, suggesting right atrial pressure of 8 mmHg.   Laboratory Data:  Chemistry Recent Labs  Lab 08/04/22 1824  NA 140  K 2.6*  CL 104  CO2 18*  GLUCOSE 161*  BUN 11  CREATININE 1.01  CALCIUM 8.8*  GFRNONAA >60  ANIONGAP 18*    Recent Labs  Lab 08/04/22 1824  PROT 7.2  ALBUMIN 3.1*  AST 73*  ALT 41  ALKPHOS 285*  BILITOT 0.9   Hematology Recent Labs  Lab 08/04/22 1824  WBC 4.2  RBC 4.45  HGB 12.5*  HCT 41.6  MCV 93.5  MCH 28.1  MCHC 30.0  RDW 19.3*  PLT PLATELET CLUMPS NOTED ON SMEAR, UNABLE TO ESTIMATE   Cardiac Enzymes Recent Labs  Lab 08/04/22 1824  TROPONINIHS 14    Radiology/Studies:  DG Chest Port 1 View  Result Date: 08/04/2022 CLINICAL DATA:  Shortness of breath and dizziness beginning today. Syncopal episode. Diaphoresis. Tachycardia. Esophageal and pancreatic carcinoma. EXAM: PORTABLE CHEST 1 VIEW COMPARISON:  05/01/2022 FINDINGS: The heart size and mediastinal contours are within normal limits. Aortic  atherosclerotic calcification incidentally noted. Right-sided power port remains in appropriate position. Both lungs are clear. IMPRESSION: No active disease. Electronically Signed   By: Marlaine Hind M.D.   On: 08/04/2022 18:59    Assessment and Plan:   1.  Sustained ventricular tachycardia requiring electrical cardioversion at home by EMS.  No active chest pain and his ECG does not show an acute injury current.  R wave progression worse in comparison to prior tracings which incidentally have shown ventricular ectopy (largely upright PVCs in the inferior leads, although the more recent ones appear of different morphology).  He is currently severely hypokalemic with potassium 2.6, magnesium level pending.  This could be secondary to active chemotherapy although he is on a potassium supplement reportedly as an outpatient.  He has a history of ischemic heart disease, specifically an occluded small nondominant RCA that was managed medically and August 2023, his LVEF has been at least low normal by last assessment.  Present high-sensitivity troponin I levels argues against ACS.  Difficult to measure QTc on ECG but does appear prolonged.  2.  CAD, occluded small nondominant RCA with otherwise no major obstructive disease as of cardiac catheterization in August 2023.  Most recent LVEF 50 to 55%.  Most recently on Plavix, Imdur, Toprol-XL, and Lipitor.  3.  History of DVT and pulmonary embolus, continues on Eliquis as an outpatient.  4.  Metastatic adenocarcinoma of the esophagus, currently undergoing active outpatient chemotherapy as discussed above, please refer to oncology notes for details.  5.  Status post left AKA, uses wheelchair, can ambulate with prosthesis and walker to some degree.  6.  Chronic anemia.  Discussed with the ER provider with workup underway.  Hospitalist service being contacted for admission, we will follow in consultation.  Starting IV amiodarone for now, replete electrolytes  including potassium and most likely magnesium (result pending).  Cycle cardiac enzymes, no clear indication for IV heparin at this point or urgent cardiac catheterization.  He will need a follow-up echocardiogram to reevaluate LVEF.  Given current complex comorbid illnesses, would anticipate medical therapy if at all possible.  If there is more clear evidence of progressive ischemic heart disease based on additional cardiac markers and echocardiography, assessment for revascularization could be considered.  We will continue to follow and help to guide additional workup as more information becomes available.  Signed, Rozann Lesches, MD  08/04/2022 7:56 PM

## 2022-08-04 NOTE — H&P (Incomplete)
Date: 08/04/2022               Patient Name:  [name] [name] MRN: [id number]  DOB: 07-24-1960 Age / Sex: 62 y.o., male   PCP: Angelique Blonder, DO         Medical Service: Internal Medicine Teaching Service         Attending Physician: Dr. Jimmye Norman, Elaina Pattee, MD      First Contact: Dr. Drucie Opitz, MD Pager [phone]    Second Contact: Dr. Brett Canales, MD Pager [phone]         After Hours (After 5p/  First Contact Pager: [phone]  weekends / holidays): Second Contact Pager: [phone]   SUBJECTIVE  Chief Complaint: syncope and shortness of breath  History of Present Illness: [name] [name] is a 62 y.o. male with a pertinent PMH of esophageal cancer, who presents to [facility] with syncope secondary to ventricular tachycardia.  For the last 2 weeks he has been nauseated and vomiting since starting chemotherapy.  This morning he woke up and felt extremely week. He was sitting in wheelchair when suddenly passed out. He came back to but felt that he could not catch his breath, denies chest pain at that time. His roommate called EMS. On arrival patient was found to be in ventricular tachycardia.      In the ED, the patient was ***  Cardiology was consulted by EDP and   Medications: No current facility-administered medications on file prior to encounter.   Current Outpatient Medications on File Prior to Encounter  Medication Sig Dispense Refill  . albuterol (VENTOLIN HFA) 108 (90 Base) MCG/ACT inhaler Inhale 2 puffs into the lungs every 6 (six) hours as needed for wheezing or shortness of breath. 8 g 2  . amLODipine (NORVASC) 10 MG tablet Take 1 tablet (10 mg total) by mouth daily. 90 tablet 3  . apixaban (ELIQUIS) 5 MG TABS tablet Take 1 tablet (5 mg total) by mouth 2 (two) times daily. 60 tablet 11  . atorvastatin (LIPITOR) 40 MG tablet Take 1 tablet (40 mg total) by mouth daily at 6 PM. 90 tablet 3  . clopidogrel (PLAVIX) 75 MG tablet Take 1 tablet (75 mg total) by mouth daily. 90 tablet  3  . DULoxetine (CYMBALTA) 30 MG capsule Take 30 mg by mouth daily.    . folic acid (FOLVITE) 1 MG tablet Take 1 tablet (1 mg total) by mouth daily. 90 tablet 3  . gabapentin (NEURONTIN) 100 MG capsule Take 1 capsule (100 mg total) by mouth See admin instructions. Daily or twice daily if tolerated. 90 capsule 0  . isosorbide mononitrate (IMDUR) 30 MG 24 hr tablet Take 1 tablet (30 mg total) by mouth daily. 90 tablet 0  . lidocaine-prilocaine (EMLA) cream Apply 1 Application topically as directed. Apply to port site 2 hours prior to stick and cover with Press-and-Seal to numb port before access 30 g 1  . meloxicam (MOBIC) 7.5 MG tablet TAKE 1 TABLET (7.5 MG TOTAL) BY MOUTH DAILY AS NEEDED FOR PAIN. 15 tablet 0  . methotrexate (RHEUMATREX) 7.5 MG tablet Take 1 tablet (7.5 mg total) by mouth once a week. Caution:Chemotherapy. Protect from light. 4 tablet 3  . metoprolol succinate (TOPROL-XL) 25 MG 24 hr tablet Take 1 tablet (25 mg total) by mouth daily. 90 tablet 3  . ondansetron (ZOFRAN) 8 MG tablet TAKE 1 TABLET(8 MG) BY MOUTH EVERY 8 HOURS AS NEEDED FOR NAUSEA OR VOMITING 20 tablet 0  .  oxyCODONE-acetaminophen (PERCOCET/ROXICET) 5-325 MG tablet Take 1 tablet by mouth every 6 (six) hours as needed for severe pain. 120 tablet 0  . potassium chloride (KLOR-CON M) 10 MEQ tablet TAKE 2 TABLETS(20 MEQ) BY MOUTH DAILY 180 tablet 0  . prochlorperazine (COMPAZINE) 10 MG tablet Take 1 tablet (10 mg total) by mouth every 8 (eight) hours as needed for refractory nausea / vomiting. TAKE 1 TABLET(10 MG) BY MOUTH EVERY 6 HOURS AS NEEDED FOR NAUSEA OR VOMITING Strength: 10 mg 60 tablet 0    Past Medical History:  Past Medical History:  Diagnosis Date  . Acute respiratory failure ([place]) 01/31/2022  . Bilateral shoulder pain 08/20/2012  . Bite wound of left hand 09/27/2021  . CAD (coronary artery disease)    Occluded nondominent RCA managed medically - August 2023  . Cardiac arrest [facility])    [facility] VF - August  2023  . Community acquired pneumonia 11/07/2011  . Critical limb ischemia with history of revascularization of same extremity ([place]) 04/28/2020  . DDD (degenerative disc disease), lumbosacral     and grade 2 slip  . DVT (deep venous thrombosis) ([place]) 01/31/2022  . Esophageal cancer ([place])    Adenocarcinoma with metastasis  . GERD (gastroesophageal reflux disease)   . History of anemia   . History of COVID-19 05/10/2019  . History of critical lower limb ischemia 02/19/2019  . Hypertension   . Impingement syndrome of shoulder region   . Ischemic ulcer of toe of left foot ([place])   . Liver mass   . Low back pain without sciatica   . Lupus ([place])   . Multiple fractures of ribs, bilateral, due to CPR trauma   . Onychomycosis   . Paronychia of great toe   . Peripheral vascular disease ([place])   . Pressure injury of skin   . Pulmonary embolus ([place])   . RA (rheumatoid arthritis) ([place])   . Rotator cuff tear 11/04/2014  . Sternal fracture   . Stroke [facility])     Social:  Lives - *** Occupation - *** Support - *** Level of function - *** PCP - *** Substance use - ***  Family History: Family History  Problem Relation Age of Onset  . Heart attack Father 69  . Alcohol abuse Father   . Aneurysm Mother   . Stroke Neg Hx   . Cancer Neg Hx     Allergies: Allergies as of 08/04/2022 - Review Complete 08/04/2022  Allergen Reaction Noted  . Dilaudid [hydromorphone hcl] Rash 05/08/2020  . Ramipril Rash and Other (See Comments) 06/23/2011  . Tramadol Itching and Rash 06/23/2011    Review of Systems: A complete ROS was negative except as per HPI.   OBJECTIVE:  Physical Exam: Blood pressure (!) 100/58, pulse 83, temperature 98 F (36.7 C), temperature source Oral, resp. rate 19, height 5' 7"$  (1.702 m), weight 62 kg, SpO2 94 %. Constitutional:  HENT:  Eyes: Neck:  Cardiovascular:  Pulmonary/Chest: Abdominal:  MSK: Neurological:  Psych:    Pertinent Labs: CBC    Component Value  Date/Time   WBC 4.2 08/04/2022 1824   RBC 4.45 08/04/2022 1824   HGB 12.5 (L) 08/04/2022 1824   HGB 10.8 (L) 07/17/2022 0943   HGB 11.4 (L) 06/15/2022 1458   HCT 41.6 08/04/2022 1824   HCT 36.1 (L) 06/15/2022 1458   PLT PLATELET CLUMPS NOTED ON SMEAR, UNABLE TO ESTIMATE 08/04/2022 1824   PLT 138 (L) 07/17/2022 0943   PLT 133 (L) 06/15/2022 1458  MCV 93.5 08/04/2022 1824   MCV 84 06/15/2022 1458   MCH 28.1 08/04/2022 1824   MCHC 30.0 08/04/2022 1824   RDW 19.3 (H) 08/04/2022 1824   RDW 19.4 (H) 06/15/2022 1458   LYMPHSABS 1.0 07/17/2022 0943   LYMPHSABS 1.1 06/15/2022 1458   MONOABS 0.6 07/17/2022 0943   EOSABS 0.3 07/17/2022 0943   EOSABS 0.2 06/15/2022 1458   BASOSABS 0.0 07/17/2022 0943   BASOSABS 0.1 06/15/2022 1458     CMP     Component Value Date/Time   NA 140 08/04/2022 1824   NA 140 06/15/2022 1458   K 2.6 (LL) 08/04/2022 1824   CL 104 08/04/2022 1824   CO2 18 (L) 08/04/2022 1824   GLUCOSE 161 (H) 08/04/2022 1824   BUN 11 08/04/2022 1824   BUN 20 06/15/2022 1458   CREATININE 1.01 08/04/2022 1824   CREATININE 0.82 07/17/2022 0943   CREATININE 0.80 11/27/2012 0956   CALCIUM 8.8 (L) 08/04/2022 1824   PROT 7.2 08/04/2022 1824   PROT 6.9 06/15/2022 1458   ALBUMIN 3.1 (L) 08/04/2022 1824   ALBUMIN 3.3 (L) 06/15/2022 1458   AST 73 (H) 08/04/2022 1824   AST 34 07/17/2022 0943   ALT 41 08/04/2022 1824   ALT 25 07/17/2022 0943   ALKPHOS 285 (H) 08/04/2022 1824   BILITOT 0.9 08/04/2022 1824   BILITOT 0.3 07/17/2022 0943   GFRNONAA >60 08/04/2022 1824   GFRNONAA >60 07/17/2022 0943   GFRNONAA >89 11/27/2012 0956   GFRAA >60 01/24/2020 0202   GFRAA >89 11/27/2012 0956    Pertinent Imaging: CT Angio Chest PE W/Cm &/Or Wo Cm  Result Date: 08/04/2022 CLINICAL DATA:  Concern for pulmonary embolism. Metastatic esophageal cancer. EXAM: CT ANGIOGRAPHY CHEST WITH CONTRAST TECHNIQUE: Multidetector CT imaging of the chest was performed using the standard protocol during  bolus administration of intravenous contrast. Multiplanar CT image reconstructions and MIPs were obtained to evaluate the vascular anatomy. RADIATION DOSE REDUCTION: This exam was performed according to the departmental dose-optimization program which includes automated exposure control, adjustment of the mA and/or kV according to patient size and/or use of iterative reconstruction technique. CONTRAST:  41m OMNIPAQUE IOHEXOL 350 MG/ML SOLN COMPARISON:  Chest radiograph dated 08/04/2022 and CT dated 07/19/2022. FINDINGS: Evaluation of this exam is limited due to respiratory motion artifact. Cardiovascular: Mild cardiomegaly. No pericardial effusion. There is coronary vascular calcification. Mild atherosclerotic calcification of the thoracic aorta. No aneurysmal dilatation. Evaluation of the pulmonary arteries is limited due to respiratory motion. To no pulmonary artery embolus identified. Right-sided Port-A-Cath with tip at the cavoatrial junction. Mediastinum/Nodes: No hilar or mediastinal adenopathy. The esophagus and the thyroid gland are grossly unremarkable. No mediastinal fluid collection. Lungs/Pleura: Minimal bibasilar subpleural atelectasis. A 5 mm right lung base subpleural nodule as well as a 3 mm right lung base nodule (94/6), present on the prior CT. Right lung base linear atelectasis/scarring. No focal consolidation, pleural effusion, or pneumothorax. The central airways are patent. Upper Abdomen: Multiple hepatic metastatic disease measure up to 5 x 7 cm. Ill-defined hypodense mass at the gastroesophageal junction measuring 5.5 x 4.5 cm. Several enlarged gastrohepatic lymph nodes measure 2.8 cm in short axis. 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 Several old healed bilateral rib fractures. No acute osseous pathology. Review of the MIP images confirms the above findings. IMPRESSION: 1. No acute intrathoracic pathology. No CT evidence of pulmonary artery embolus. 2.  Gastroesophageal junction mass and multiple hepatic metastatic disease in keeping with history  of malignancy. 3. Small right lung base pulmonary nodules as seen on the prior CT. 4.  Aortic Atherosclerosis (ICD10-I70.0). Electronically Signed   By: Anner Crete M.D.   On: 08/04/2022 19:55   DG Chest Port 1 View  Result Date: 08/04/2022 CLINICAL DATA:  Shortness of breath and dizziness beginning today. Syncopal episode. Diaphoresis. Tachycardia. Esophageal and pancreatic carcinoma. EXAM: PORTABLE CHEST 1 VIEW COMPARISON:  05/01/2022 FINDINGS: The heart size and mediastinal contours are within normal limits. Aortic atherosclerotic calcification incidentally noted. Right-sided power port remains in appropriate position. Both lungs are clear. IMPRESSION: No active disease. Electronically Signed   By: Marlaine Hind M.D.   On: 08/04/2022 18:59    EKG: personally reviewed my interpretation is {ekg findings:315101}  ASSESSMENT & PLAN:  Assessment: Principal Problem:   Ventricular tachycardia (HCC)   [name] [name] is a 62 y.o. with pertinent PMH of *** who presented with *** and admit for *** on hospital day 0  Plan:  Ventricular tachycardia  Hypokalemia Hypomagnesemia  Esophageal cancer  Best Practice: Diet: {CHL DISCHARGE DIET:21201} IVF: Fluids: {Meds; iv fluids:31617}, Rate: {NAMES:3044014::"None","*** cc/hr x *** hrs","*** cc bolus"} VTE: enoxaparin (LOVENOX) injection 40 mg Start: 08/04/22 2245 Code: {NAMES:3044014::"Full","DNR","DNI","DNR/DNI","Comfort Care","Unknown"} AB: *** Status: {STATUS:3044014::"Observation with expected length of stay less than 2 midnights.","Inpatient with expected length of stay greater than 2 midnights."} Anticipated Discharge Location: {NAMES:3044014::"Home","SNF","CIR","***"} Barriers to Discharge: {BARRIERS TO [id number]  Signature: Daleen Bo. Genine Beckett, D.O.  Internal Medicine Resident, PGY-2 [facility]  Pager:  [phone] 10:53 PM, 08/04/2022   Please contact the on call pager after 5 pm and on weekends at [phone].

## 2022-08-04 NOTE — Hospital Course (Signed)
Home from EMS  Intermittent dizzineess no cp or syncope  Hgb 12.5 Platelets clumped [place] 14 K 2.6 CO2 18 Mag?? Albumin 3.1 AST 73   amiodarone

## 2022-08-04 NOTE — ED Notes (Signed)
ED TO INPATIENT HANDOFF REPORT  ED Nurse Name and Phone #: Cammie Mcgee [phone]  S Name/Age/Gender [name] [name] 62 y.o. male Room/Bed: RESUSC/RESUSC  Code Status   Code Status: Full Code  Home/SNF/Other Home Patient oriented to: self, place, time, and situation Is this baseline? Yes   Triage Complete: Triage complete  Chief Complaint Ventricular tachycardia ([place]) [I47.20]  Triage Note Pt lives with friends. Woke up with sob, dizziness today. Witnessed syncopal episode at home by a friend. EMS reports pt was clammy, diaphoretic with HR at 240 showing vtach upon arrival from pt home. Pt was cardioverted at 100J and converted to [place]. Denies hx of cardiac issues. Takes blood thinners. Currently awake, alert and oriented x 4.   Allergies Allergies  Allergen Reactions   Dilaudid [Hydromorphone Hcl] Rash   Ramipril Rash and Other (See Comments)    Per patient on R arm and leg only; no angioedema   Tramadol Itching and Rash    Level of Care/Admitting Diagnosis ED Disposition     ED Disposition  Admit   Condition  --   Comment  Hospital Area: [place] [[id number]]  Level of Care: Telemetry Medical [[id number]]  May admit patient to [facility] or [facility] if equivalent level of care is available:: No  Covid Evaluation: Asymptomatic - no recent exposure (last 10 days) testing not required  Diagnosis: Ventricular tachycardia [facility]) [id number]  Admitting Physician: Angelica Pou [[id number]]  Attending Physician: Angelica Pou [id number]  Certification:: I certify this patient will need inpatient services for at least 2 midnights  Estimated Length of Stay: 3          B Medical/Surgery History Past Medical History:  Diagnosis Date   Acute respiratory failure ([place]) 01/31/2022   Bilateral shoulder pain 08/20/2012   Bite wound of left hand 09/27/2021   CAD (coronary artery disease)    Occluded nondominent RCA managed medically - August 2023   Cardiac  arrest [facility])    OOH VF - August 2023   Community acquired pneumonia 11/07/2011   Critical limb ischemia with history of revascularization of same extremity ([place]) 04/28/2020   DDD (degenerative disc disease), lumbosacral     and grade 2 slip   DVT (deep venous thrombosis) ([place]) 01/31/2022   Esophageal cancer ([place])    Adenocarcinoma with metastasis   GERD (gastroesophageal reflux disease)    History of anemia    History of COVID-19 05/10/2019   History of critical lower limb ischemia 02/19/2019   Hypertension    Impingement syndrome of shoulder region    Ischemic ulcer of toe of left foot (HCC)    Liver mass    Low back pain without sciatica    Lupus ([place])    Multiple fractures of ribs, bilateral, due to CPR trauma    Onychomycosis    Paronychia of great toe    Peripheral vascular disease ([place])    Pressure injury of skin    Pulmonary embolus (HCC)    RA (rheumatoid arthritis) ([place])    Rotator cuff tear 11/04/2014   Sternal fracture    Stroke [facility])    Past Surgical History:  Procedure Laterality Date   ABDOMINAL AORTAGRAM  02/20/2019   ABDOMINAL AORTOGRAM W/LOWER EXTREMITY N/A 02/20/2019   Procedure: ABDOMINAL AORTOGRAM W/LOWER EXTREMITY;  Surgeon: Marty Heck, MD;  Location: [place] [facility];  Service: Cardiovascular;  Laterality: N/A;   ACHILLES TENDON SURGERY Bilateral    AMPUTATION Left 05/11/2020   Procedure:  LEFT ABOVE KNEE AMPUTATION;  Surgeon: Angelia Mould, MD;  Location: [place];  Service: Vascular;  Laterality: Left;   ENDARTERECTOMY POPLITEAL Left 05/01/2020   Procedure: ENDARTERECTOMY POPLITEAL;  Surgeon: Waynetta Sandy, MD;  Location: [place];  Service: Vascular;  Laterality: Left;   FASCIOTOMY Left 05/01/2020   Procedure: FASCIOTOMY;  Surgeon: Waynetta Sandy, MD;  Location: [place];  Service: Vascular;  Laterality: Left;   FEMORAL-POPLITEAL BYPASS GRAFT Left 02/26/2019   Procedure: BYPASS GRAFT FEMORAL-POPLITEAL ARTERY LEFT LEG  USING 43m PROPATEN GRAFT;  Surgeon: BSerafina Mitchell MD;  [place]  Service: Vascular;  Laterality: Left;   INTRAOPERATIVE ARTERIOGRAM Left 01/22/2020   Procedure: INTRA OPERATIVE ARTERIOGRAM with administration of thrombolyics in Left femoral to popliteal bypass.;  Surgeon: CMarty Heck MD;  [place]  Service: Vascular;  Laterality: Left;   IR IMAGING GUIDED PORT INSERTION  03/07/2022   LEFT HEART CATH AND CORONARY ANGIOGRAPHY N/A 03/15/2017   Procedure: LEFT HEART CATH AND CORONARY ANGIOGRAPHY;  Surgeon: ENelva Bush MD;  [place];  Service: Cardiovascular;  Laterality: N/A;   LEFT HEART CATH AND CORONARY ANGIOGRAPHY N/A 02/06/2022   Procedure: LEFT HEART CATH AND CORONARY ANGIOGRAPHY;  Surgeon: JMartinique Peter M, MD;  [place];  Service: Cardiovascular;  Laterality: N/A;   LOWER EXTREMITY INTERVENTION Left 04/29/2020   Procedure: LOWER EXTREMITY INTERVENTION- LYSIS;  Surgeon: CMarty Heck MD;  [place] [facility];  Service: Cardiovascular;  Laterality: Left;   OSTEOTOMY AND ULNAR SHORTENING Right 07/22/2002   PATCH ANGIOPLASTY Left 05/01/2020   Procedure: PATCH ANGIOPLASTY USING HEMASHIELD PLATINUM FINESSE PATCH;  Surgeon: CWaynetta Sandy MD;  [place]  Service: Vascular;  Laterality: Left;   PERIPHERAL VASCULAR ATHERECTOMY  01/23/2020   Procedure: PERIPHERAL VASCULAR ATHERECTOMY;  Surgeon: CMarty Heck MD;  [place];  Service: Cardiovascular;;   PERIPHERAL VASCULAR BALLOON ANGIOPLASTY  01/23/2020   Procedure: PERIPHERAL VASCULAR BALLOON ANGIOPLASTY;  Surgeon: CMarty Heck MD;  [place] [facility];  Service: Cardiovascular;;   PERIPHERAL VASCULAR INTERVENTION Left 04/30/2020   Procedure: PERIPHERAL VASCULAR INTERVENTION;  Surgeon: HCherre Robins MD;  [place];  Service: Cardiovascular;  Laterality: Left;   PERIPHERAL VASCULAR  THROMBECTOMY N/A 01/23/2020   Procedure: lysis recheck;  Surgeon: CMarty Heck MD;  [facility];  Service: Cardiovascular;  Laterality: N/A;  + [place]    PERIPHERAL VASCULAR THROMBECTOMY N/A 04/30/2020   Procedure: Lysis Recheck;  Surgeon: HCherre Robins MD;  [place] [facility];  Service: Cardiovascular;  Laterality: N/A;   SHOULDER ARTHROSCOPY WITH BICEPSTENOTOMY Right 03/11/2015   Procedure: SHOULDER ARTHROSCOPY WITH BICEPSTENOTOMY;  Surgeon: DNinetta Lights MD;  [place]  Service: Orthopedics;  Laterality: Right;   SHOULDER ARTHROSCOPY WITH DISTAL CLAVICLE RESECTION Left 11/19/2014   Procedure: SHOULDER ARTHROSCOPY WITH DISTAL CLAVICLE RESECTION;  Surgeon: DKathryne Hitch MD;  [place]  Service: Orthopedics;  Laterality: Left;   SHOULDER ARTHROSCOPY WITH ROTATOR CUFF REPAIR AND SUBACROMIAL DECOMPRESSION Left 11/19/2014   Procedure: LEFT SHOULDER ARTHROSCOPY, DEBRIDEMENT DISTAL CLAVICLE EXCISION, ACROMIOPLASTY WITH ROTATOR CUFF REPAIR ;  Surgeon: DKathryne Hitch MD;  [place]  Service: Orthopedics;  Laterality: Left;   THROMBECTOMY OF BYPASS GRAFT FEMORAL- POPLITEAL ARTERY Left 05/01/2020   Procedure: THROMBECTOMY OF LOWER EXTREMITY;  Surgeon: CWaynetta Sandy MD;  [place]  Service:  Vascular;  Laterality: Left;   TRANSFORAMINAL LUMBAR INTERBODY FUSION (TLIF) WITH PEDICLE SCREW FIXATION 1 LEVEL N/A 01/03/2018   Procedure: TRANSFORAMINAL LUMBAR INTERBODY FUSION (TLIF) LUMBAR FIVE-SACRAL ONE;  Surgeon: Melina Schools, MD;  Location: [place];  Service: Orthopedics;  Laterality: N/A;   ULTRASOUND GUIDANCE FOR VASCULAR ACCESS Right 01/22/2020   Procedure: ULTRASOUND GUIDANCE FOR VASCULAR ACCESS Right Common Femoral Artery.;  Surgeon: Marty Heck, MD;  Location: [place];  Service: Vascular;  Laterality: Right;   VIDEO ASSISTED THORACOSCOPY (VATS)/DECORTICATION Right  11/21/2011   drainage of empyema     A IV Location/Drains/Wounds Patient Lines/Drains/Airways Status     Active Line/Drains/Airways     Name Placement date Placement time Site Days   Implanted Port 03/07/22 Right Chest 03/07/22  1100  Chest  150   Peripheral IV 08/04/22 18 G Right Antecubital 08/04/22  1821  Antecubital  less than 1   Pressure Injury 01/31/22 Buttocks Right Stage 1 -  Intact skin with non-blanchable redness of a localized area usually over a bony prominence. 01/31/22  1615  -- 185   Wound / Incision (Open or Dehisced) 02/19/19 Non-pressure wound Toe (Comment  which one) Left 02/19/19  2003  Toe (Comment  which one)  1262   Wound / Incision (Open or Dehisced) 02/19/19 Non-pressure wound Leg Right;Anterior;Lower 02/19/19  2000  Leg  1262   Wound / Incision (Open or Dehisced) 02/15/22 Puncture Abdomen Right;Upper 02/15/22  1439  Abdomen  170            Intake/Output Last 24 hours  Intake/Output Summary (Last 24 hours) at 08/04/2022 2326 Last data filed at 08/04/2022 2123 Gross per 24 hour  Intake 50 ml  Output --  Net 50 ml    Labs/Imaging Results for orders placed or performed during the hospital encounter of 08/04/22 (from the past 48 hour(s))  CBG monitoring, ED     Status: Abnormal   Collection Time: 08/04/22  6:17 PM  Result Value Ref Range   Glucose-Capillary 155 (H) 70 - 99 mg/dL    Comment: Glucose reference range applies only to samples taken after fasting for at least 8 hours.  CBC     Status: Abnormal   Collection Time: 08/04/22  6:24 PM  Result Value Ref Range   WBC 4.2 4.0 - 10.5 K/uL   RBC 4.45 4.22 - 5.81 MIL/uL   Hemoglobin 12.5 (L) 13.0 - 17.0 g/dL   HCT 41.6 39.0 - 52.0 %   MCV 93.5 80.0 - 100.0 fL   MCH 28.1 26.0 - 34.0 pg   MCHC 30.0 30.0 - 36.0 g/dL   RDW 19.3 (H) 11.5 - 15.5 %   Platelets PLATELET CLUMPS NOTED ON SMEAR, UNABLE TO ESTIMATE 150 - 400 K/uL    Comment: Immature Platelet Fraction may be clinically indicated,  consider ordering this additional test [id number] PLATELET CLUMPS NOTED ON SMEAR, UNABLE TO ESTIMATE    nRBC 0.0 0.0 - 0.2 %    Comment: Performed at [facility], [place] [address], [place], [place] [address]  Troponin I (High Sensitivity)     Status: None   Collection Time: 08/04/22  6:24 PM  Result Value Ref Range   Troponin I (High Sensitivity) 14 <18 ng/L    Comment: (NOTE) Elevated high sensitivity troponin I (hsTnI) values and significant  changes across serial measurements may suggest ACS but many other  chronic and acute conditions are known to elevate hsTnI results.  Refer to the "Links" section for  chest pain algorithms and additional  guidance. Performed at [facility], [place] [address], [place], [place] [address]   Comprehensive metabolic panel     Status: Abnormal   Collection Time: 08/04/22  6:24 PM  Result Value Ref Range   Sodium 140 135 - 145 mmol/L   Potassium 2.6 (LL) 3.5 - 5.1 mmol/L    Comment: CRITICAL RESULT CALLED TO, READ BACK BY AND VERIFIED WITH Samantha Crimes, RN @ [phone] 08/04/22 BY SEKDAHL   Chloride 104 98 - 111 mmol/L   CO2 18 (L) 22 - 32 mmol/L   Glucose, Bld 161 (H) 70 - 99 mg/dL    Comment: Glucose reference range applies only to samples taken after fasting for at least 8 hours.   BUN 11 8 - 23 mg/dL   Creatinine, Ser 1.01 0.61 - 1.24 mg/dL   Calcium 8.8 (L) 8.9 - 10.3 mg/dL   Total Protein 7.2 6.5 - 8.1 g/dL   Albumin 3.1 (L) 3.5 - 5.0 g/dL   AST 73 (H) 15 - 41 U/L   ALT 41 0 - 44 U/L   Alkaline Phosphatase 285 (H) 38 - 126 U/L   Total Bilirubin 0.9 0.3 - 1.2 mg/dL   GFR, Estimated >60 >60 mL/min    Comment: (NOTE) Calculated using the CKD-EPI Creatinine Equation (2021)    Anion gap 18 (H) 5 - 15    Comment: Performed at [facility], [place] [address] [place], [place] [address]  Magnesium     Status: None   Collection Time: 08/04/22  6:28 PM  Result Value Ref Range   Magnesium 1.7 1.7 - 2.4 mg/dL    Comment: Performed at  [facility], [place] [address], [place], [place] [address]  TSH     Status: None   Collection Time: 08/04/22  6:28 PM  Result Value Ref Range   TSH 2.959 0.350 - 4.500 uIU/mL    Comment: Performed by a 3rd Generation assay with a functional sensitivity of <=0.01 uIU/mL. Performed at [facility], [place] [address], [place], [place] [address]    CT Angio Chest PE W/Cm &/Or Wo Cm  Result Date: 08/04/2022 CLINICAL DATA:  Concern for pulmonary embolism. Metastatic esophageal cancer. EXAM: CT ANGIOGRAPHY CHEST WITH CONTRAST TECHNIQUE: Multidetector CT imaging of the chest was performed using the standard protocol during bolus administration of intravenous contrast. Multiplanar CT image reconstructions and MIPs were obtained to evaluate the vascular anatomy. RADIATION DOSE REDUCTION: This exam was performed according to the departmental dose-optimization program which includes automated exposure control, adjustment of the mA and/or kV according to patient size and/or use of iterative reconstruction technique. CONTRAST:  67m OMNIPAQUE IOHEXOL 350 MG/ML SOLN COMPARISON:  Chest radiograph dated 08/04/2022 and CT dated 07/19/2022. FINDINGS: Evaluation of this exam is limited due to respiratory motion artifact. Cardiovascular: Mild cardiomegaly. No pericardial effusion. There is coronary vascular calcification. Mild atherosclerotic calcification of the thoracic aorta. No aneurysmal dilatation. Evaluation of the pulmonary arteries is limited due to respiratory motion. To no pulmonary artery embolus identified. Right-sided Port-A-Cath with tip at the cavoatrial junction. Mediastinum/Nodes: No hilar or mediastinal adenopathy. The esophagus and the thyroid gland are grossly unremarkable. No mediastinal fluid collection. Lungs/Pleura: Minimal bibasilar subpleural atelectasis. A 5 mm right lung base subpleural nodule as well as a 3 mm right lung base nodule (94/6), present on the prior CT. Right lung base linear  atelectasis/scarring. No focal consolidation, pleural effusion, or pneumothorax. The central airways are patent.  Upper Abdomen: Multiple hepatic metastatic disease measure up to 5 x 7 cm. Ill-defined hypodense mass at the gastroesophageal junction measuring 5.5 x 4.5 cm. Several enlarged gastrohepatic lymph nodes measure 2.8 cm in short axis. 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 Several old healed bilateral rib fractures. No acute osseous pathology. Review of the MIP images confirms the above findings. IMPRESSION: 1. No acute intrathoracic pathology. No CT evidence of pulmonary artery embolus. 2. Gastroesophageal junction mass and multiple hepatic metastatic disease in keeping with history of malignancy. 3. Small right lung base pulmonary nodules as seen on the prior CT. 4.  Aortic Atherosclerosis (ICD10-I70.0). Electronically Signed   By: Anner Crete M.D.   On: 08/04/2022 19:55   DG Chest Port 1 View  Result Date: 08/04/2022 CLINICAL DATA:  Shortness of breath and dizziness beginning today. Syncopal episode. Diaphoresis. Tachycardia. Esophageal and pancreatic carcinoma. EXAM: PORTABLE CHEST 1 VIEW COMPARISON:  05/01/2022 FINDINGS: The heart size and mediastinal contours are within normal limits. Aortic atherosclerotic calcification incidentally noted. Right-sided power port remains in appropriate position. Both lungs are clear. IMPRESSION: No active disease. Electronically Signed   By: Marlaine Hind M.D.   On: 08/04/2022 18:59    Pending Labs Unresulted Labs (From admission, onward)     Start     Ordered   08/05/22 [id number]  Basic metabolic panel  Tomorrow morning,   R        08/04/22 2244   08/05/22 0500  CBC  Tomorrow morning,   R        08/04/22 2244   08/05/22 [id number]  Basic metabolic panel  Once,   R        08/04/22 2303            Vitals/Pain Today's Vitals   08/04/22 2000 08/04/22 2015 08/04/22 2115 08/04/22 2255  BP: 109/77 98/75 (!) 100/58    Pulse: 94 88 83   Resp: (!) 25 (!) 23 19   Temp:    98.3 F (36.8 C)  TempSrc:    Oral  SpO2: 99% 98% 94%   Weight:      Height:      PainSc:    8     Isolation Precautions No active isolations  Medications Medications  amiodarone (NEXTERONE) 1.8 mg/mL load via infusion 150 mg (150 mg Intravenous Bolus from Bag 08/04/22 1946)    Followed by  amiodarone (NEXTERONE PREMIX) 360-4.14 MG/200ML-% (1.8 mg/mL) IV infusion (60 mg/hr Intravenous New Bag/Given 08/04/22 2252)    Followed by  amiodarone (NEXTERONE PREMIX) 360-4.14 MG/200ML-% (1.8 mg/mL) IV infusion (has no administration in time range)  potassium chloride 10 mEq in 100 mL IVPB (10 mEq Intravenous New Bag/Given 08/04/22 2251)  enoxaparin (LOVENOX) injection 40 mg (40 mg Subcutaneous Given 08/04/22 2307)  meloxicam (MOBIC) tablet 7.5 mg (has no administration in time range)  oxyCODONE-acetaminophen (PERCOCET/ROXICET) 5-325 MG per tablet 1 tablet (1 tablet Oral Given 08/04/22 2307)  magnesium sulfate IVPB 2 g 50 mL (0 g Intravenous Stopped 08/04/22 2123)  potassium chloride SA (KLOR-CON M) CR tablet 40 mEq (40 mEq Oral Given 08/04/22 2016)  iohexol (OMNIPAQUE) 350 MG/ML injection 75 mL (75 mLs Intravenous Contrast Given 08/04/22 1932)  ondansetron (ZOFRAN) injection 4 mg (4 mg Intravenous Given 08/04/22 1949)    Mobility walks     Focused Assessments See chart   R Recommendations: See Admitting Provider Note  Report given to:   Additional Notes: n/a

## 2022-08-04 NOTE — ED Triage Notes (Addendum)
Pt lives with friends. Woke up with sob, dizziness today. Witnessed syncopal episode at home by a friend. EMS reports pt was clammy, diaphoretic with HR at 240 showing vtach upon arrival from pt home. Pt was cardioverted at 100J and converted to [place]. Denies hx of cardiac issues. Takes blood thinners. Currently awake, alert and oriented x 4.

## 2022-08-04 NOTE — ED Provider Notes (Signed)
[facility] Provider Note   CSN: [id number] Arrival date & time: 08/04/22  1809     History {Add pertinent medical, surgical, social history, OB history to HPI:1} No chief complaint on file.   [name] [name] is a 62 y.o. male.  HPI Patient is a 62 year old male with past medical history significant for lupus, anemia, hypertension, rheumatoid arthritis, pneumonia, esophageal carcinoma with metastasis to liver, DVT/PE on Eliquis  Patient presents emergency room today after being brought in by EMS for shortness of breath and ventricular tachycardia.  Seems that he woke up this morning has been feeling short of breath weak and fatigued all day.  May have had some questionable symptoms over the past couple days.  He denies any episodes of chest pain or syncope.  States that he was feeling quite unwell and Nodding off and almost syncopized and when EMS was eventually called.  He was administered 100 J of electricity and had successful cardioversion to a junctional rhythm.  Was brought to emergency room.  Patient states that he feels well currently.  Denies any pain or symptoms.      Home Medications Prior to Admission medications   Medication Sig Start Date End Date Taking? Authorizing Provider  albuterol (VENTOLIN HFA) 108 (90 Base) MCG/ACT inhaler Inhale 2 puffs into the lungs every 6 (six) hours as needed for wheezing or shortness of breath. 06/15/22   Linus Galas, MD  amLODipine (NORVASC) 10 MG tablet Take 1 tablet (10 mg total) by mouth daily. 06/15/22 06/15/23  Linus Galas, MD  apixaban (ELIQUIS) 5 MG TABS tablet Take 1 tablet (5 mg total) by mouth 2 (two) times daily. 05/16/22   [name], DO  atorvastatin (LIPITOR) 40 MG tablet Take 1 tablet (40 mg total) by mouth daily at 6 PM. 06/15/22   Linus Galas, MD  clopidogrel (PLAVIX) 75 MG tablet Take 1 tablet (75 mg total) by mouth daily. 06/15/22 06/15/23   Linus Galas, MD  DULoxetine (CYMBALTA) 30 MG capsule Take 30 mg by mouth daily. 06/21/22   [provider]  folic acid (FOLVITE) 1 MG tablet Take 1 tablet (1 mg total) by mouth daily. 05/16/22   [name], DO  gabapentin (NEURONTIN) 100 MG capsule Take 1 capsule (100 mg total) by mouth See admin instructions. Daily or twice daily if tolerated. 06/15/22   Linus Galas, MD  isosorbide mononitrate (IMDUR) 30 MG 24 hr tablet Take 1 tablet (30 mg total) by mouth daily. 06/15/22   Linus Galas, MD  lidocaine-prilocaine (EMLA) cream Apply 1 Application topically as directed. Apply to port site 2 hours prior to stick and cover with Press-and-Seal to numb port before access 05/29/22   Ladell Pier, MD  meloxicam (MOBIC) 7.5 MG tablet TAKE 1 TABLET (7.5 MG TOTAL) BY MOUTH DAILY AS NEEDED FOR PAIN. 07/17/22   Angelique Blonder, DO  methotrexate (RHEUMATREX) 7.5 MG tablet Take 1 tablet (7.5 mg total) by mouth once a week. Caution:Chemotherapy. Protect from light. 06/15/22   Linus Galas, MD  metoprolol succinate (TOPROL-XL) 25 MG 24 hr tablet Take 1 tablet (25 mg total) by mouth daily. 06/15/22 06/10/23  Linus Galas, MD  ondansetron (ZOFRAN) 8 MG tablet TAKE 1 TABLET(8 MG) BY MOUTH EVERY 8 HOURS AS NEEDED FOR NAUSEA OR VOMITING 07/11/22   Angelique Blonder, DO  oxyCODONE-acetaminophen (PERCOCET/ROXICET) 5-325 MG tablet Take 1 tablet by mouth every 6 (six) hours as needed for severe pain. 07/10/22   Riesa Pope, MD  potassium chloride (KLOR-CON M) 10 MEQ tablet TAKE 2 TABLETS(20 MEQ) BY MOUTH DAILY 05/29/22   Ladell Pier, MD  prochlorperazine (COMPAZINE) 10 MG tablet Take 1 tablet (10 mg total) by mouth every 8 (eight) hours as needed for refractory nausea / vomiting. TAKE 1 TABLET(10 MG) BY MOUTH EVERY 6 HOURS AS NEEDED FOR NAUSEA OR VOMITING Strength: 10 mg 06/15/22   Linus Galas, MD      Allergies    Dilaudid [hydromorphone  hcl], Ramipril, and Tramadol    Review of Systems   Review of Systems  Physical Exam Updated Vital Signs BP 104/65   Pulse (!) 105   Temp 98 F (36.7 C) (Oral)   Resp (!) 35   Ht 5' 7"$  (1.702 m)   Wt 62 kg   SpO2 95%   BMI 21.41 kg/m  Physical Exam Vitals and nursing note reviewed.  Constitutional:      General: He is not in acute distress.    Comments: Appears older than stated age  HENT:     Head: Normocephalic and atraumatic.     Nose: Nose normal.     Mouth/Throat:     Mouth: Mucous membranes are moist.  Eyes:     General: No scleral icterus. Cardiovascular:     Rate and Rhythm: Regular rhythm. Tachycardia present.     Pulses: Normal pulses.     Heart sounds: Normal heart sounds.     Comments: Slight tachycardia heart rate between 105 and 110 regular rhythm Pulmonary:     Effort: Pulmonary effort is normal. No respiratory distress.     Breath sounds: No wheezing.  Abdominal:     Palpations: Abdomen is soft.     Tenderness: There is no abdominal tenderness.  Musculoskeletal:     Cervical back: Normal range of motion.     Right lower leg: No edema.     Left lower leg: No edema.  Skin:    General: Skin is warm and dry.     Capillary Refill: Capillary refill takes less than 2 seconds.  Neurological:     Mental Status: He is alert. Mental status is at baseline.  Psychiatric:        Mood and Affect: Mood normal.        Behavior: Behavior normal.     ED Results / Procedures / Treatments   Labs (all labs ordered are listed, but only abnormal results are displayed) Labs Reviewed  CBG MONITORING, ED - Abnormal; Notable for the following components:      Result Value   Glucose-Capillary 155 (*)    All other components within normal limits  CBC  COMPREHENSIVE METABOLIC PANEL  MAGNESIUM  TSH  TROPONIN I (HIGH SENSITIVITY)    EKG EKG Interpretation  Date/Time:  Friday August 04 2022 18:10:43 EST Ventricular Rate:  107 PR Interval:  128 QRS  Duration: 110 QT Interval:  329 QTC Calculation: 439 R Axis:   246 Text Interpretation: Sinus tachycardia Ventricular premature complex Anterolateral infarct, age indeterminate Confirmed by Tretha Sciara [phone]) on 08/04/2022 6:15:04 PM  Radiology No results found.  Procedures Procedures  {Document cardiac monitor, telemetry assessment procedure when appropriate:1}  Medications Ordered in ED Medications  magnesium sulfate IVPB 1 g 100 mL (has no administration in time range)    ED Course/ Medical Decision Making/ A&P Clinical Course as of 08/04/22 2241  Fri Aug 04, 2022  1823 Cardioverted by EMS- Vtach [CC]  1858 Heparin if trops + Echo Amio hold for  now  Correct labs.  If several hundred trop.  [WF]  [place] [WF]  2211 Internal med service: will admit [WF]    Clinical Course User Index [CC] Tretha Sciara, MD [WF] Tedd Sias, [place]   {   Click here for ABCD2, HEART and other calculatorsREFRESH Note before signing :1}                          Medical Decision Making Amount and/or Complexity of Data Reviewed Labs: ordered. Radiology: ordered.  Risk Prescription drug management. Decision regarding hospitalization.   ***  {Document critical care time when appropriate:1} {Document review of labs and clinical decision tools ie heart score, Chads2Vasc2 etc:1}  {Document your independent review of radiology images, and any outside records:1} {Document your discussion with family members, caretakers, and with consultants:1} {Document social determinants of health affecting pt's care:1} {Document your decision making why or why not admission, treatments were needed:1} Final Clinical Impression(s) / ED Diagnoses Final diagnoses:  None    Rx / DC Orders ED Discharge Orders     None

## 2022-08-05 ENCOUNTER — Inpatient Hospital Stay (HOSPITAL_COMMUNITY): Payer: 59

## 2022-08-05 ENCOUNTER — Encounter (HOSPITAL_COMMUNITY): Payer: Self-pay | Admitting: Internal Medicine

## 2022-08-05 ENCOUNTER — Other Ambulatory Visit: Payer: Self-pay

## 2022-08-05 DIAGNOSIS — I2510 Atherosclerotic heart disease of native coronary artery without angina pectoris: Secondary | ICD-10-CM

## 2022-08-05 DIAGNOSIS — R7989 Other specified abnormal findings of blood chemistry: Secondary | ICD-10-CM | POA: Diagnosis not present

## 2022-08-05 DIAGNOSIS — Z87891 Personal history of nicotine dependence: Secondary | ICD-10-CM

## 2022-08-05 DIAGNOSIS — I472 Ventricular tachycardia, unspecified: Secondary | ICD-10-CM

## 2022-08-05 DIAGNOSIS — R0602 Shortness of breath: Secondary | ICD-10-CM

## 2022-08-05 LAB — BASIC METABOLIC PANEL
Anion gap: 10 (ref 5–15)
BUN: 14 mg/dL (ref 8–23)
CO2: 25 mmol/L (ref 22–32)
Calcium: 8 mg/dL — ABNORMAL LOW (ref 8.9–10.3)
Chloride: 103 mmol/L (ref 98–111)
Creatinine, Ser: 0.72 mg/dL (ref 0.61–1.24)
GFR, Estimated: >60 mL/min (ref >60)
Glucose, Bld: 141 mg/dL — ABNORMAL HIGH (ref 70–99)
Potassium: 3 mmol/L — ABNORMAL LOW (ref 3.5–5.1)
Sodium: 138 mmol/L (ref 135–145)

## 2022-08-05 LAB — COMPREHENSIVE METABOLIC PANEL
ALT: 32 U/L (ref 0–44)
AST: 38 U/L (ref 15–41)
Albumin: 2.5 g/dL — ABNORMAL LOW (ref 3.5–5.0)
Alkaline Phosphatase: 207 U/L — ABNORMAL HIGH (ref 38–126)
Anion gap: 10 (ref 5–15)
BUN: 10 mg/dL (ref 8–23)
CO2: 23 mmol/L (ref 22–32)
Calcium: 8 mg/dL — ABNORMAL LOW (ref 8.9–10.3)
Chloride: 103 mmol/L (ref 98–111)
Creatinine, Ser: 0.64 mg/dL (ref 0.61–1.24)
GFR, Estimated: >60 mL/min (ref >60)
Glucose, Bld: 102 mg/dL — ABNORMAL HIGH (ref 70–99)
Potassium: 3.6 mmol/L (ref 3.5–5.1)
Sodium: 136 mmol/L (ref 135–145)
Total Bilirubin: 0.4 mg/dL (ref 0.3–1.2)
Total Protein: 5.8 g/dL — ABNORMAL LOW (ref 6.5–8.1)

## 2022-08-05 LAB — ECHOCARDIOGRAM COMPLETE
AR max vel: 2.53 cm2
AV Area VTI: 2.81 cm2
AV Area mean vel: 2.62 cm2
AV Mean grad: 4 mmHg
AV Peak grad: 7.8 mmHg
Ao pk vel: 1.4 m/s
Area-P 1/2: 3.68 cm2
Height: 67 in
S' Lateral: 3.5 cm
Weight: 2222.24 oz

## 2022-08-05 LAB — CBC
HCT: 32.7 % — ABNORMAL LOW (ref 39.0–52.0)
Hemoglobin: 10.5 g/dL — ABNORMAL LOW (ref 13.0–17.0)
MCH: 28.5 pg (ref 26.0–34.0)
MCHC: 32.1 g/dL (ref 30.0–36.0)
MCV: 88.6 fL (ref 80.0–100.0)
Platelets: 93 10*3/uL — ABNORMAL LOW (ref 150–400)
RBC: 3.69 MIL/uL — ABNORMAL LOW (ref 4.22–5.81)
RDW: 18.8 % — ABNORMAL HIGH (ref 11.5–15.5)
WBC: 4.6 10*3/uL (ref 4.0–10.5)
nRBC: 0 % (ref 0.0–0.2)

## 2022-08-05 LAB — TROPONIN I (HIGH SENSITIVITY)
Troponin I (High Sensitivity): 108 ng/L (ref <18)
Troponin I (High Sensitivity): 83 ng/L — ABNORMAL HIGH (ref <18)
Troponin I (High Sensitivity): 74 ng/L — ABNORMAL HIGH (ref <18)

## 2022-08-05 MED ORDER — OXYCODONE HCL 5 MG PO TABS
5.0000 mg | ORAL_TABLET | ORAL | Status: DC | PRN
  Administered 2022-08-05 – 2022-08-07 (×10): 5 mg via ORAL
  Filled 2022-08-05 (×10): qty 1

## 2022-08-05 MED ORDER — ISOSORBIDE MONONITRATE ER 30 MG PO TB24
30.0000 mg | ORAL_TABLET | Freq: Every day | ORAL | Status: DC
  Administered 2022-08-05 – 2022-08-09 (×5): 30 mg via ORAL
  Filled 2022-08-05 (×5): qty 1

## 2022-08-05 MED ORDER — POTASSIUM CHLORIDE 20 MEQ PO PACK
40.0000 meq | PACK | Freq: Two times a day (BID) | ORAL | Status: AC
  Administered 2022-08-05 (×2): 40 meq via ORAL
  Filled 2022-08-05 (×2): qty 2

## 2022-08-05 MED ORDER — ALBUTEROL SULFATE (2.5 MG/3ML) 0.083% IN NEBU: 3.0000 mL | INHALATION_SOLUTION | Freq: Four times a day (QID) | RESPIRATORY_TRACT | Status: DC | PRN

## 2022-08-05 MED ORDER — LACTATED RINGERS IV SOLN: INTRAVENOUS | Status: AC

## 2022-08-05 MED ORDER — APIXABAN 5 MG PO TABS
5.0000 mg | ORAL_TABLET | Freq: Two times a day (BID) | ORAL | Status: DC
  Administered 2022-08-05 – 2022-08-09 (×9): 5 mg via ORAL
  Filled 2022-08-05 (×9): qty 1

## 2022-08-05 MED ORDER — CLOPIDOGREL BISULFATE 75 MG PO TABS
75.0000 mg | ORAL_TABLET | Freq: Every day | ORAL | Status: DC
  Administered 2022-08-05 – 2022-08-09 (×5): 75 mg via ORAL
  Filled 2022-08-05 (×5): qty 1

## 2022-08-05 MED ORDER — POTASSIUM CHLORIDE 10 MEQ/100ML IV SOLN
10.0000 meq | INTRAVENOUS | Status: AC
  Administered 2022-08-05 (×2): 10 meq via INTRAVENOUS
  Filled 2022-08-05 (×2): qty 100

## 2022-08-05 MED ORDER — ATORVASTATIN CALCIUM 40 MG PO TABS
40.0000 mg | ORAL_TABLET | Freq: Every day | ORAL | Status: DC
  Administered 2022-08-05 – 2022-08-08 (×4): 40 mg via ORAL
  Filled 2022-08-05 (×4): qty 1

## 2022-08-05 MED ORDER — FOLIC ACID 1 MG PO TABS
1.0000 mg | ORAL_TABLET | Freq: Every day | ORAL | Status: DC
  Administered 2022-08-05 – 2022-08-09 (×5): 1 mg via ORAL
  Filled 2022-08-05 (×5): qty 1

## 2022-08-05 MED ORDER — CHLORHEXIDINE GLUCONATE CLOTH 2 % EX PADS
6.0000 | MEDICATED_PAD | Freq: Every day | CUTANEOUS | Status: DC
  Administered 2022-08-05 – 2022-08-08 (×4): 6 via TOPICAL

## 2022-08-05 MED ORDER — PREDNISONE 20 MG PO TABS
40.0000 mg | ORAL_TABLET | Freq: Every day | ORAL | Status: DC
  Administered 2022-08-05 – 2022-08-09 (×5): 40 mg via ORAL
  Filled 2022-08-05 (×5): qty 2

## 2022-08-05 NOTE — Evaluation (Signed)
Occupational Therapy Evaluation Patient Details Name: [name] [name] MRN: [id number] DOB: 14-Jul-1960 Today's Date: 08/05/2022   History of Present Illness 62 y.o. male adm 2/9 with a pertinent PMH of metastatic adenocarcinoma likely esophageal primary, out of hospital vfib arrest 8/23 2/2 to inferior MI, prior CVA occipital lobe, PAD s/p left AKA 2021, RA, who presented with syncope secondary to ventricular tachycardia.   Clinical Impression   Patient admitted for the diagnosis above.  PTA he lives with friends who assist as needed for transfers, ADL and iADL support.  Patient stating he has been needing increased assist for ADL and transfers, and was receiving [facility], but it was stopped due to a flu outbreak at his home, and has not been restarted.  Eval was limited as he does not have his prosthetic leg at the hospital, and there is no recliner or RW in his room.  The patient is expecting to return home with [facility] rehab and continued support from his roommates.  OT will continue efforts in the acute setting to address deficits listed.   BP supine 92/66 BP sit 109/77     Recommendations for follow up therapy are one component of a multi-disciplinary discharge planning process, led by the attending physician.  Recommendations may be updated based on patient status, additional functional criteria and insurance authorization.   Follow Up Recommendations  Home health OT     Assistance Recommended at Discharge Intermittent Supervision/Assistance  Patient can return home with the following Assist for transportation;Assistance with cooking/housework;A little help with bathing/dressing/bathroom;A lot of help with walking and/or transfers    Functional Status Assessment  Patient has had a recent decline in their functional status and demonstrates the ability to make significant improvements in function in a reasonable and predictable amount of time.  Equipment Recommendations  None recommended by OT     Recommendations for Other Services       Precautions / Restrictions Precautions Precautions: Fall Restrictions Weight Bearing Restrictions: No      Mobility Bed Mobility Overal bed mobility: Needs Assistance Bed Mobility: Supine to Sit, Sit to Supine     Supine to sit: Supervision Sit to supine: Supervision        Transfers Overall transfer level: Needs assistance   Transfers: Sit to/from Stand Sit to Stand: Mod assist           General transfer comment: does not have prosthetic at the hospital.      Balance Overall balance assessment: Needs assistance Sitting-balance support: Feet supported, Bilateral upper extremity supported Sitting balance-Leahy Scale: Fair                                     ADL either performed or assessed with clinical judgement   ADL Overall ADL's : Needs assistance/impaired Eating/Feeding: Set up;Bed level   Grooming: Wash/dry hands;Wash/dry face;Sitting;Min guard   Upper Body Bathing: Min guard;Sitting   Lower Body Bathing: Moderate assistance;Sitting/lateral leans   Upper Body Dressing : Min guard;Sitting   Lower Body Dressing: Moderate assistance;Sitting/lateral leans   Toilet Transfer: Maximal assistance;Squat-pivot;BSC/3in1                   Vision Patient Visual Report: No change from baseline       Perception     Praxis      Pertinent Vitals/Pain Pain Assessment Pain Assessment: No/denies pain     Hand Dominance Left  Extremity/Trunk Assessment Upper Extremity Assessment Upper Extremity Assessment: Generalized weakness;RUE deficits/detail;LUE deficits/detail RUE Deficits / Details: severe RA to hand RUE Sensation: decreased light touch RUE Coordination: decreased fine motor LUE Deficits / Details: severe RA to hand LUE Sensation: decreased light touch LUE Coordination: decreased fine motor       Cervical / Trunk Assessment Cervical / Trunk Assessment: Normal    Communication Communication Communication: No difficulties   Cognition Arousal/Alertness: Awake/alert Behavior During Therapy: WFL for tasks assessed/performed Overall Cognitive Status: Within Functional Limits for tasks assessed                                                        Home Living Family/patient expects to be discharged to:: Private residence Living Arrangements: Non-relatives/Friends Available Help at Discharge: Friend(s);Available 24 hours/day Type of Home: House Home Access: Ramped entrance     Home Layout: One level     Bathroom Shower/Tub: Teacher, early years/pre: Standard Bathroom Accessibility: Yes How Accessible: Accessible via wheelchair Home Equipment: Tub bench;Grab bars - tub/shower;Rolling Walker (2 wheels);Wheelchair - manual;BSC/3in1;Hand held shower head          Prior Functioning/Environment Prior Level of Function : Needs assist             Mobility Comments: since prior hospitalizationpt requiring assist for transfers to and from wheelchair and car. ADLs Comments: Friends manage IADLs, intermittent assist for ADLs (tub transfers, LB dressing as needed), friends assist with transportation        OT Problem List: Decreased strength;Decreased activity tolerance;Impaired balance (sitting and/or standing)      OT Treatment/Interventions: Self-care/ADL training;Therapeutic activities;Balance training;Patient/family education;DME and/or AE instruction    OT Goals(Current goals can be found in the care plan section) Acute Rehab OT Goals Patient Stated Goal: Hoping to go home tomorrow OT Goal Formulation: With patient Time For Goal Achievement: 08/18/22 Potential to Achieve Goals: Good ADL Goals Pt Will Perform Grooming: with set-up;sitting Pt Will Transfer to Toilet: bedside commode;with min guard assist;squat pivot transfer  OT Frequency: Min 2X/week    Co-evaluation              AM-PAC OT  "6 Clicks" Daily Activity     Outcome Measure Help from another person eating meals?: None Help from another person taking care of personal grooming?: A Little Help from another person toileting, which includes using toliet, bedpan, or urinal?: A Lot Help from another person bathing (including washing, rinsing, drying)?: A Lot Help from another person to put on and taking off regular upper body clothing?: A Little Help from another person to put on and taking off regular lower body clothing?: A Lot 6 Click Score: 16   End of Session Nurse Communication: Mobility status  Activity Tolerance: Patient tolerated treatment well Patient left: in bed;with call bell/phone within reach;with bed alarm set  OT Visit Diagnosis: Unsteadiness on feet (R26.81);Muscle weakness (generalized) (M62.81)                Time: [id number] OT Time Calculation (min): 19 min Charges:  OT General Charges $OT Visit: 1 Visit OT Evaluation $OT Eval Moderate Complexity: 1 Mod  08/05/2022  RP, OTR/L  Acute Rehabilitation Services  Office:  [phone]   Metta Clines 08/05/2022, 10:46 AM

## 2022-08-05 NOTE — Progress Notes (Addendum)
NAME:  [name] [name], MRN:  [id number], DOB:  05-21-1961, LOS: 1 ADMISSION DATE:  08/04/2022  Subjective  Patient evaluated at bedside this a.m.  Reports feeling well today, improved from admission.  Prior to coming to the hospital he reports he began to feel so dizzy he had to lay down.  Reports he has experienced dizziness in the past, but not as bad as this incident.  He denies chest pain but reports SOB during the onset of his dizziness.  Today he reports adequate sleep and appetite.  Reports his last bowel movement was yesterday, notes no irregularities.  He is currently receiving chemotherapy to treat his cancer, reports that his oncologist recently told him that his cancer is responding well to treatment.  He is hoping for discussed.  States he is tolerating chemo, but vomits often, at least twice a week.  Reports he has not vomited in the last 4 to 5 days.  Currently denies any nausea, chest pain, or dizziness.  Regarding his history of arthritis, he reports that his right hand (dominant side) and lower back are currently flaring up.  Reports that prednisone and oxycodone has helped manage his symptoms of RA in the past.  Rates his current pain at 5/10 right now, pain is usually at a 2/10.  Reports that his roommates to [employer] help with cleaning, washing clothes, pain feels, and cooking.  Reports he is able to dress himself, but has difficulty grasping objects with his right hand.  He is alert and oriented x 4.  Objective   Blood pressure 96/66, pulse 88, temperature 98.2 F (36.8 C), temperature source Axillary, resp. rate 16, height 5' 7"$  (1.702 m), weight 63 kg, SpO2 95 %.     Intake/Output Summary (Last 24 hours) at 08/05/2022 1314 Last data filed at 08/05/2022 0900 Gross per 24 hour  Intake 918.11 ml  Output 250 ml  Net 668.11 ml   Filed Weights   08/04/22 1818 08/05/22 0052  Weight: 62 kg 63 kg   Physical Exam: General: Elderly male, fairly groomed, appears stated  age. CV: RRR. No M/R/G Pulm: Equal chest expansion.  Normal work of breathing on room air.  Clear lung sounds bilaterally Abdomen: Flat abdomen.  No distention appreciated.  Normal active bowel sounds.  Soft to palpation. MSK: Limited ROM of digits of RUE, consistent with RA. Ulnar deviation of BUE w/ interosseous muscle atrophy, more prominent on right. Left AKA.  Neuro: AAOx4, CN I-XII grossly intact.  Motor deficits not out of proportion to chronic RA findings. Intact sensation grossly intact.  Psych: Appropriate mood and affect. CV:       RLE: No appreciable pulses of dorsalis pedis, posterior tibial pulse. Faint popliteal pulse.   Labs       Latest Ref Rng & Units 08/05/2022    4:43 AM 08/04/2022    6:24 PM 07/17/2022    9:43 AM  CBC  WBC 4.0 - 10.5 K/uL 4.6  4.2  5.5   Hemoglobin 13.0 - 17.0 g/dL 10.5  12.5  10.8   Hematocrit 39.0 - 52.0 % 32.7  41.6  35.2   Platelets 150 - 400 K/uL 93  PLATELET CLUMPS NOTED ON SMEAR, UNABLE TO ESTIMATE  138       Latest Ref Rng & Units 08/05/2022    4:43 AM 08/05/2022   12:55 AM 08/04/2022    6:24 PM  BMP  Glucose 70 - 99 mg/dL 102  141  161  BUN 8 - 23 mg/dL 10  14  11   $ Creatinine 0.61 - 1.24 mg/dL 0.64  0.72  1.01   Sodium 135 - 145 mmol/L 136  138  140   Potassium 3.5 - 5.1 mmol/L 3.6  3.0  2.6   Chloride 98 - 111 mmol/L 103  103  104   CO2 22 - 32 mmol/L 23  25  18   $ Calcium 8.9 - 10.3 mg/dL 8.0  8.0  8.8     Summary  [name] [name] is a 62 y.o. with pertinent PMH of metastatic adenocarcinoma likely esophageal primary, out of hospital vfib arrest 8/23 2/2 to inferior MI, prior CVA occipital lobe, PAD s/p left AKA 2021, RA who presented with syncope due to ventricular tachycardia requiring cardioversion and admit for ventricular tachycardia on hospital day 1.   Assessment & Plan:  Principal Problem:   Ventricular tachycardia (HCC)   Sustained ventricular tachycardia s/p electrical cardioversion by EMS History of vfib arrest in  setting of Inferior MI 8/23 CAD Elevated Troponins Hx of CAD s/p CABG ,complete occlusion of proximal RCA, prior NSTEMI. Denies chest pain, shortness of breath, or dizziness on evaluation today.  EKG show sinus rhythm and prior RCA infarct. Downtrending trops [id number]. K+ and Mg+ stabilized.  - Appreciate Cardiology's care and recommendations - Continue IV amiodarone -Continue Imdur 30 mg daily -Hold metoprolol succinate 25 mg - Morning BMP, goal K >4 and Mg >2 - F/u echo    Hypokalemia Hypomagnesemia Prolonged Qtc For the last 2 weeks, [name] [name] has had difficulty with tolerating PO intake due to nausea and vomiting since last chemo infusion 2 weeks ago. He takes compazine for this but ran out and needs to pick up refill. K improved from 2.6 to 3 with supplementation. Magnesium at 1.7. Hypokalemia has been a recurrent problem for him requiring supplementation in infusion center and he is taking 20 mEq at home daily to which he endorses adherence.  Qtc 484, will plan to repeat EKG in AM. Would use Qtc prolonging anti-emetics judicially in setting of cardiac history versus consider alternative agents such as ativan. -Trend BMP and Mg -repeat EKG following repletion of electrolytes   Metastatic adenocarcinoma, likely esophagus Malnutrition He was found to have liver mass 8/23 on CT after cardiac arrest and biopsy consistent with adenocarcinoma, PET showed esophageal mass. He was not able to undergo EGD in setting of cardiac arrest. He started chemotherapy 10/23 and is on cycle 7 last dose 1/22. Overall I am concerned about his ability to tolerate chemotherapy moving forward. If electrolyte derangements decreasing threshold for vtach/vfib then more aggressive symptom management is needed if patient's goals are to continue with chemotherapy. He would benefit from palliative referral. This is his 64th ED/ admission since 8/23. - will continue home pain medications with Oxycodone-Acetaminophen 5-325  q 6 hr PRN, has pain contract with [facility]. PDMP reviewed. -gabapentin 100 mg BID, patient mentions he does not find this medication helpful. Will hold off on restarting. -consider adding ensure shakes as tolerated -PT/OT    HTN Vitals stable today. Home medications: Imdur 30 mg, amlodipine 10 mg qd, metoprolol 25 mg qd, and chlorthalidone. From 12/23 office visit, patient has been having dizziness and increasing frailty. Note indicates discontinuing chlorthalidone however patient continues to have this in medications and is taking daily. -Continue imdur 30 mg -Continue holding amlodipine, metoprolol, and chlorthalidone  -Clarify w/ pt Chlorthalidone should be discontinued at home tomorrow   History of DVT/ PE on  apixaban PAD Hx of L AKA in 2021 On chart review, Bilateral lower extremity DVT 7/23 and PE 8/23. Also occlusion of left femoral popliteal artery bypass w/ PTFE, with compromised runoff and resulting in AKA in 2021.  During assessment with patient, he was mistakenly under the impression his AKA had been done because a malignancy was was discovered in the leg. On PE, peripheral pulses of RLE were weak/absentr. In setting of malignancy he will need to continue DOAC.  - Continue Eliquis 5 mg BID - Continue Plavix 75 mg QD - Restart Lipitor 40 mg QD   Rheumatoid arthritis Endorses pain related right hand and lower back, patient has significant limited ROM on his right hand.  Endorses 5/10 pain of the affected joints.  Reports pain has been well-controlled in the past with prednisone and oxycodone.  Amenable to starting these medications.  Will continue to hold methotrexate as this medication may have contributed to his sxs of nausea and fatigue.  - Start prednisone 40 mg p.o. daily - Start oxycodone 5 mg every 4 PRN for management of his pain - Continue meloxicam - Hold  home medication of methotrexate 7.5 mg weekly - Folate supplementation   Normocytic anemia Chronic, stable. Hgb  12.5>10.5.  -Trend CBC   COPD Chronic, stable. Patient is on room air at baseline and no home medications or inhalers -CTM   Best Practice: Diet: Heart healthy diet VTE: Eliquis 5 mg BID for afib Code: Full Status: Inpatient with expected length of stay greater than 2 midnights. Anticipated Discharge Location: SNF Barriers to Discharge:  Further evaluation of ventricular tachycardia  Christene Slates, MD PGY-1 PAGER: [phone] 08/05/2022 1:14 PM  If after hours (below), please contact on-call pager: [phone] 5PM-7AM Monday-Friday 1PM-7AM Saturday-Sunday

## 2022-08-05 NOTE — Progress Notes (Signed)
PT Cancellation Note  Patient Details Name: [name] [name] MRN: [id number] DOB: 04/15/1961   Cancelled Treatment:    Reason Eval/Treat Not Completed: Patient at procedure or test/unavailable (pt leaving for vascular lab)  Wyona Almas, PT, DPT Acute Rehabilitation Services Office [place] 08/05/2022, 2:50 PM

## 2022-08-05 NOTE — Progress Notes (Signed)
Rounding Note    Patient Name: [name] [name] Date of Encounter: 08/05/2022  [place] Cardiologist: None   Subjective   Feeling well currently.  Inpatient Medications    Scheduled Meds:  apixaban  5 mg Oral BID   atorvastatin  40 mg Oral q1800   Chlorhexidine Gluconate Cloth  6 each Topical Daily   clopidogrel  75 mg Oral Daily   folic acid  1 mg Oral Daily   isosorbide mononitrate  30 mg Oral Daily   meloxicam  7.5 mg Oral Q breakfast   predniSONE  40 mg Oral Q breakfast   Continuous Infusions:  amiodarone 30 mg/hr (08/05/22 0819)   lactated ringers 100 mL/hr at 08/05/22 0658   PRN Meds: albuterol, oxyCODONE   Vital Signs    Vitals:   08/05/22 0759 08/05/22 0800 08/05/22 0810 08/05/22 0900  BP: 93/67 93/67  98/76  Pulse: 78 80 78 83  Resp: 20   20  Temp: 97.8 F (36.6 C)   98.8 F (37.1 C)  TempSrc: Oral   Axillary  SpO2: 92% 93% 93% 96%  Weight:      Height:        Intake/Output Summary (Last 24 hours) at 08/05/2022 1126 Last data filed at 08/05/2022 0900 Gross per 24 hour  Intake 918.11 ml  Output 250 ml  Net 668.11 ml      08/05/2022   12:52 AM 08/04/2022    6:18 PM 07/17/2022    9:52 AM  Last 3 Weights  Weight (lbs) 138 lb 14.2 oz 136 lb 11 oz 137 lb 4.8 oz  Weight (kg) 63 kg 62 kg 62.279 kg      Telemetry    SR pvcs - Personally Reviewed  ECG    SR inf and ant infarct- Personally Reviewed  Physical Exam   GEN: No acute distress.   Neck: No JVD Cardiac: RRR, no murmurs, rubs, or gallops.  Respiratory: Clear to auscultation bilaterally. GI: Soft, nontender, non-distended  MS: No edema Neuro:  Nonfocal  Psych: Normal affect   Labs    High Sensitivity Troponin:   Recent Labs  Lab 08/04/22 1824 08/05/22 0055 08/05/22 0552 08/05/22 0751  TROPONINIHS 14 108* 83* 74*     Chemistry Recent Labs  Lab 08/04/22 1824 08/04/22 1828 08/05/22 0055 08/05/22 0443  NA 140  --  138 136  K 2.6*  --  3.0* 3.6  CL 104  --   103 103  CO2 18*  --  25 23  GLUCOSE 161*  --  141* 102*  BUN 11  --  14 10  CREATININE 1.01  --  0.72 0.64  CALCIUM 8.8*  --  8.0* 8.0*  MG  --  1.7  --   --   PROT 7.2  --   --  5.8*  ALBUMIN 3.1*  --   --  2.5*  AST 73*  --   --  38  ALT 41  --   --  32  ALKPHOS 285*  --   --  207*  BILITOT 0.9  --   --  0.4  GFRNONAA >60  --  >60 >60  ANIONGAP 18*  --  10 10    Lipids No results for input(s): "CHOL", "TRIG", "HDL", "LABVLDL", "LDLCALC", "CHOLHDL" in the last 168 hours.  Hematology Recent Labs  Lab 08/04/22 1824 08/05/22 0443  WBC 4.2 4.6  RBC 4.45 3.69*  HGB 12.5* 10.5*  HCT 41.6 32.7*  MCV  93.5 88.6  MCH 28.1 28.5  MCHC 30.0 32.1  RDW 19.3* 18.8*  PLT PLATELET CLUMPS NOTED ON SMEAR, UNABLE TO ESTIMATE 93*   Thyroid  Recent Labs  Lab 08/04/22 1828  TSH 2.959    BNPNo results for input(s): "BNP", "PROBNP" in the last 168 hours.  DDimer No results for input(s): "DDIMER" in the last 168 hours.   Radiology    CT Angio Chest PE W/Cm &/Or Wo Cm  Result Date: 08/04/2022 CLINICAL DATA:  Concern for pulmonary embolism. Metastatic esophageal cancer. EXAM: CT ANGIOGRAPHY CHEST WITH CONTRAST TECHNIQUE: Multidetector CT imaging of the chest was performed using the standard protocol during bolus administration of intravenous contrast. Multiplanar CT image reconstructions and MIPs were obtained to evaluate the vascular anatomy. RADIATION DOSE REDUCTION: This exam was performed according to the departmental dose-optimization program which includes automated exposure control, adjustment of the mA and/or kV according to patient size and/or use of iterative reconstruction technique. CONTRAST:  40m OMNIPAQUE IOHEXOL 350 MG/ML SOLN COMPARISON:  Chest radiograph dated 08/04/2022 and CT dated 07/19/2022. FINDINGS: Evaluation of this exam is limited due to respiratory motion artifact. Cardiovascular: Mild cardiomegaly. No pericardial effusion. There is coronary vascular calcification. Mild  atherosclerotic calcification of the thoracic aorta. No aneurysmal dilatation. Evaluation of the pulmonary arteries is limited due to respiratory motion. To no pulmonary artery embolus identified. Right-sided Port-A-Cath with tip at the cavoatrial junction. Mediastinum/Nodes: No hilar or mediastinal adenopathy. The esophagus and the thyroid gland are grossly unremarkable. No mediastinal fluid collection. Lungs/Pleura: Minimal bibasilar subpleural atelectasis. A 5 mm right lung base subpleural nodule as well as a 3 mm right lung base nodule (94/6), present on the prior CT. Right lung base linear atelectasis/scarring. No focal consolidation, pleural effusion, or pneumothorax. The central airways are patent. Upper Abdomen: Multiple hepatic metastatic disease measure up to 5 x 7 cm. Ill-defined hypodense mass at the gastroesophageal junction measuring 5.5 x 4.5 cm. Several enlarged gastrohepatic lymph nodes measure 2.8 cm in short axis. 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 Several old healed bilateral rib fractures. No acute osseous pathology. Review of the MIP images confirms the above findings. IMPRESSION: 1. No acute intrathoracic pathology. No CT evidence of pulmonary artery embolus. 2. Gastroesophageal junction mass and multiple hepatic metastatic disease in keeping with history of malignancy. 3. Small right lung base pulmonary nodules as seen on the prior CT. 4.  Aortic Atherosclerosis (ICD10-I70.0). Electronically Signed   By: AAnner CreteM.D.   On: 08/04/2022 19:55   DG Chest Port 1 View  Result Date: 08/04/2022 CLINICAL DATA:  Shortness of breath and dizziness beginning today. Syncopal episode. Diaphoresis. Tachycardia. Esophageal and pancreatic carcinoma. EXAM: PORTABLE CHEST 1 VIEW COMPARISON:  05/01/2022 FINDINGS: The heart size and mediastinal contours are within normal limits. Aortic atherosclerotic calcification incidentally noted. Right-sided power port  remains in appropriate position. Both lungs are clear. IMPRESSION: No active disease. Electronically Signed   By: JMarlaine HindM.D.   On: 08/04/2022 18:59    Cardiac Studies   Echo pending  Patient Profile     62y.o. male with esophageal cancer and complex past medical history presenting for sustained ventricular tachycardia.   He was admitted to the hospital in August 2023 following a successfully resuscitated out of hospital ventricular fibrillation arrest.  He was ultimately taken to the cardiac catheterization lab and found to have an occluded small nondominant RCA that was managed medically, otherwise no major obstructive stenosis.  LVEF was 50  to 55%.  He was followed by our cardiology service, although has not had outpatient follow-up since that time.   He is currently undergoing active chemotherapy for treatment of metastatic adenocarcinoma of the esophagus, had cycle 7 including oxaliplatin and trastuzumab on 07/17/2022, 5-FU was held.  Assessment & Plan    1.  Sustained ventricular tachycardia requiring electrical cardioversion at home by EMS.  No active chest pain and his ECG does not show an acute injury current.  R wave progression worse in comparison to prior tracings which incidentally have shown ventricular ectopy (largely upright PVCs in the inferior leads, although the more recent ones appear of different morphology).  He presented severely hypokalemic with potassium 2.6, magnesium level 1.7.  This could be secondary to active chemotherapy although he is on a potassium supplement reportedly as an outpatient.  He has a history of ischemic heart disease, specifically an occluded small nondominant RCA that was managed medically and August 2023, his LVEF has been at least low normal by last assessment.  Present high-sensitivity troponin I levels argues against ACS.  Difficult to measure Qtc, measures normal on EKG today. - IV amiodarone - assess echocardiogram today.  Troponins  peaked at 100 and are downtrending, suggests against ACS. No current indication for IV heparin. Likely medical management based on #2, #4. - will consider EP consult given prior VF arrest.    2.  CAD, occluded small nondominant RCA with otherwise no major obstructive disease as of cardiac catheterization in August 2023.  Most recent LVEF 50 to 55%.  Most recently on Plavix, Imdur, Toprol-XL, and Lipitor.   3.  History of DVT and pulmonary embolus, continues on Eliquis as an outpatient.   4.  Metastatic adenocarcinoma of the esophagus, currently undergoing active outpatient chemotherapy as discussed above, please refer to oncology notes for details.   5.  Status post left AKA, uses wheelchair, can ambulate with prosthesis and walker to some degree.   6.  Chronic anemia.        For questions or updates, please contact [place] Please consult [email or web address] for contact info under        Signed, Elouise Munroe, MD  08/05/2022, 11:26 AM

## 2022-08-06 DIAGNOSIS — I472 Ventricular tachycardia, unspecified: Secondary | ICD-10-CM | POA: Diagnosis not present

## 2022-08-06 LAB — CBC
HCT: 30.9 % — ABNORMAL LOW (ref 39.0–52.0)
Hemoglobin: 9.9 g/dL — ABNORMAL LOW (ref 13.0–17.0)
MCH: 28.7 pg (ref 26.0–34.0)
MCHC: 32 g/dL (ref 30.0–36.0)
MCV: 89.6 fL (ref 80.0–100.0)
Platelets: 98 10*3/uL — ABNORMAL LOW (ref 150–400)
RBC: 3.45 MIL/uL — ABNORMAL LOW (ref 4.22–5.81)
RDW: 18.5 % — ABNORMAL HIGH (ref 11.5–15.5)
WBC: 4.6 10*3/uL (ref 4.0–10.5)
nRBC: 0 % (ref 0.0–0.2)

## 2022-08-06 LAB — BASIC METABOLIC PANEL WITH GFR
Anion gap: 11 (ref 5–15)
BUN: 11 mg/dL (ref 8–23)
CO2: 25 mmol/L (ref 22–32)
Calcium: 9 mg/dL (ref 8.9–10.3)
Chloride: 102 mmol/L (ref 98–111)
Creatinine, Ser: 0.75 mg/dL (ref 0.61–1.24)
GFR, Estimated: >60 mL/min
Glucose, Bld: 134 mg/dL — ABNORMAL HIGH (ref 70–99)
Potassium: 3.7 mmol/L (ref 3.5–5.1)
Sodium: 138 mmol/L (ref 135–145)

## 2022-08-06 MED ORDER — SENNOSIDES-DOCUSATE SODIUM 8.6-50 MG PO TABS
1.0000 | ORAL_TABLET | Freq: Every day | ORAL | Status: DC
  Administered 2022-08-06 – 2022-08-08 (×3): 1 via ORAL
  Filled 2022-08-06 (×3): qty 1

## 2022-08-06 NOTE — Progress Notes (Signed)
Progress Note  Patient Name: [name] [name] Date of Encounter: 08/06/2022  Primary Cardiologist:   None   Subjective   Feels week but denies chest pain.  No acute SOB.  Inpatient Medications    Scheduled Meds:  apixaban  5 mg Oral BID   atorvastatin  40 mg Oral q1800   Chlorhexidine Gluconate Cloth  6 each Topical Daily   clopidogrel  75 mg Oral Daily   folic acid  1 mg Oral Daily   isosorbide mononitrate  30 mg Oral Daily   meloxicam  7.5 mg Oral Q breakfast   predniSONE  40 mg Oral Q breakfast   Continuous Infusions:  amiodarone 30 mg/hr (08/06/22 0726)   PRN Meds: albuterol, oxyCODONE   Vital Signs    Vitals:   08/06/22 0400 08/06/22 0601 08/06/22 0721 08/06/22 0800  BP: (!) 146/92 (!) 155/97 (!) 134/97 (!) 143/96  Pulse: 74 82 86 84  Resp: 17 16 19 20  $ Temp:  98.2 F (36.8 C) 97.6 F (36.4 C)   TempSrc:  Oral Oral   SpO2: 95% 95% 95% 93%  Weight:      Height:        Intake/Output Summary (Last 24 hours) at 08/06/2022 0903 Last data filed at 08/06/2022 0824 Gross per 24 hour  Intake 1840.68 ml  Output 1450 ml  Net 390.68 ml   Filed Weights   08/04/22 1818 08/05/22 0052  Weight: 62 kg 63 kg    Telemetry    NSR, with frequent ventricular ectopy.  - Personally Reviewed  ECG    NA - Personally Reviewed  Physical Exam   GEN: No acute distress.   Neck: No  JVD Cardiac: RRR, no murmurs, rubs, or gallops.  Respiratory: Clear  to auscultation bilaterally. GI: Soft, nontender, non-distended  MS: No  edema; No deformity.  Status post amputation Neuro:  Nonfocal  Psych: Normal affect   Labs    Chemistry Recent Labs  Lab 08/04/22 1824 08/05/22 0055 08/05/22 0443 08/06/22 0445  NA 140 138 136 138  K 2.6* 3.0* 3.6 3.7  CL 104 103 103 102  CO2 18* 25 23 25  $ GLUCOSE 161* 141* 102* 134*  BUN 11 14 10 11  $ CREATININE 1.01 0.72 0.64 0.75  CALCIUM 8.8* 8.0* 8.0* 9.0  PROT 7.2  --  5.8*  --   ALBUMIN 3.1*  --  2.5*  --   AST 73*  --  38   --   ALT 41  --  32  --   ALKPHOS 285*  --  207*  --   BILITOT 0.9  --  0.4  --   GFRNONAA >60 >60 >60 >60  ANIONGAP 18* 10 10 11     $ Hematology Recent Labs  Lab 08/04/22 1824 08/05/22 0443 08/06/22 0445  WBC 4.2 4.6 4.6  RBC 4.45 3.69* 3.45*  HGB 12.5* 10.5* 9.9*  HCT 41.6 32.7* 30.9*  MCV 93.5 88.6 89.6  MCH 28.1 28.5 28.7  MCHC 30.0 32.1 32.0  RDW 19.3* 18.8* 18.5*  PLT PLATELET CLUMPS NOTED ON SMEAR, UNABLE TO ESTIMATE 93* 98*    Cardiac EnzymesNo results for input(s): "TROPONINI" in the last 168 hours. No results for input(s): "TROPIPOC" in the last 168 hours.   BNPNo results for input(s): "BNP", "PROBNP" in the last 168 hours.   DDimer No results for input(s): "DDIMER" in the last 168 hours.   Radiology    ECHOCARDIOGRAM COMPLETE  Result Date: 08/05/2022  ECHOCARDIOGRAM REPORT   Patient Name:   [name] [name] Date of Exam: 08/05/2022 Medical Rec #:  [id number]     Height:       67.0 in Accession #:    [id number]    Weight:       138.9 lb Date of Birth:  1960-07-29    BSA:          1.732 m Patient Age:    62 years      BP:           128/75 mmHg Patient Gender: M             HR:           85 bpm. Exam Location:  Inpatient Procedure: 2D Echo, 3D Echo, Cardiac Doppler and Color Doppler Indications:    I47.2 Ventricular tachycardia  History:        Patient has prior history of Echocardiogram examinations, most                 recent 05/01/2022. CAD, Prior CABG, PAD and [place] cancer;                 Risk Factors:Hypertension and Dyslipidemia.  Sonographer:    Wilkie Aye RVT RCS Referring Phys: [place]  1. Left ventricular ejection fraction, by estimation, is 45 to 50%. The left ventricle has mildly decreased function. Left ventricular endocardial border not optimally defined to evaluate regional wall motion. There is mild left ventricular hypertrophy.  Left ventricular diastolic parameters are consistent with Grade I diastolic dysfunction  (impaired relaxation). There is the interventricular septum is flattened in systole and diastole, consistent with right ventricular pressure and volume overload.  2. Right ventricular systolic function is moderately reduced. The right ventricular size is moderately enlarged. There is normal pulmonary artery systolic pressure. The estimated right ventricular systolic pressure is [id number] mmHg.  3. Right atrial size was moderately dilated.  4. The mitral valve is normal in structure. No evidence of mitral valve regurgitation. No evidence of mitral stenosis.  5. The aortic valve is tricuspid. There is moderate calcification of the aortic valve. Aortic valve regurgitation is not visualized. No aortic stenosis is present.  6. The inferior vena cava is normal in size with greater than 50% respiratory variability, suggesting right atrial pressure of 3 mmHg. Comparison(s): No significant change from prior study. FINDINGS  Left Ventricle: Left ventricular ejection fraction, by estimation, is 45 to 50%. The left ventricle has mildly decreased function. Left ventricular endocardial border not optimally defined to evaluate regional wall motion. The left ventricular internal cavity size was normal in size. There is mild left ventricular hypertrophy. The interventricular septum is flattened in systole and diastole, consistent with right ventricular pressure and volume overload. Left ventricular diastolic parameters are consistent with Grade I diastolic dysfunction (impaired relaxation). Right Ventricle: The right ventricular size is moderately enlarged. Right vetricular wall thickness was not well visualized. Right ventricular systolic function is moderately reduced. There is normal pulmonary artery systolic pressure. The tricuspid regurgitant velocity is 2.65 m/s, and with an assumed right atrial pressure of 3 mmHg, the estimated right ventricular systolic pressure is 99991111 mmHg. Left Atrium: Left atrial size was normal in size.  Right Atrium: Right atrial size was moderately dilated. Pericardium: There is no evidence of pericardial effusion. Mitral Valve: The mitral valve is normal in structure. No evidence of mitral valve regurgitation. No evidence of mitral valve stenosis. Tricuspid Valve: The tricuspid valve is normal  in structure. Tricuspid valve regurgitation is mild . No evidence of tricuspid stenosis. Aortic Valve: The aortic valve is tricuspid. There is moderate calcification of the aortic valve. Aortic valve regurgitation is not visualized. No aortic stenosis is present. Aortic valve mean gradient measures 4.0 mmHg. Aortic valve peak gradient measures 7.8 mmHg. Aortic valve area, by VTI measures 2.81 cm. Pulmonic Valve: The pulmonic valve was not well visualized. Pulmonic valve regurgitation is not visualized. No evidence of pulmonic stenosis. Aorta: The aortic root and ascending aorta are structurally normal, with no evidence of dilitation. Venous: The inferior vena cava is normal in size with greater than 50% respiratory variability, suggesting right atrial pressure of 3 mmHg. IAS/Shunts: No atrial level shunt detected by color flow Doppler.  LEFT VENTRICLE PLAX 2D LVIDd:         5.10 cm   Diastology LVIDs:         3.50 cm   LV e' medial:   6.31 cm/s LV PW:         1.20 cm   LV E/e' medial: 8.9 LV IVS:        1.20 cm LVOT diam:     2.20 cm LV SV:         68 LV SV Index:   39 LVOT Area:     3.80 cm  3D Volume EF:                          3D EF:        51 %                          LV EDV:       113 ml                          LV ESV:       56 ml                          LV SV:        57 ml RIGHT VENTRICLE            IVC RV Basal diam:  4.70 cm    IVC diam: 1.50 cm RV S prime:     7.49 cm/s TAPSE (M-mode): 1.5 cm LEFT ATRIUM             Index        RIGHT ATRIUM           Index LA diam:        3.70 cm 2.14 cm/m   RA Area:     21.30 cm LA Vol (A2C):   36.7 ml 21.19 ml/m  RA Volume:   60.20 ml  34.76 ml/m LA Vol (A4C):   34.8  ml 20.09 ml/m LA Biplane Vol: 37.4 ml 21.59 ml/m  AORTIC VALVE                    PULMONIC VALVE AV Area (Vmax):    2.53 cm     PV Vmax:       0.94 m/s AV Area (Vmean):   2.62 cm     PV Peak grad:  3.5 mmHg AV Area (VTI):     2.81 cm AV Vmax:           140.00 cm/s AV Vmean:  99.400 cm/s AV VTI:            0.242 m AV Peak Grad:      7.8 mmHg AV Mean Grad:      4.0 mmHg LVOT Vmax:         93.30 cm/s LVOT Vmean:        68.400 cm/s LVOT VTI:          0.179 m LVOT/AV VTI ratio: 0.74  AORTA Ao Root diam: 3.60 cm Ao Asc diam:  3.50 cm Ao Arch diam: 2.7 cm MITRAL VALVE               TRICUSPID VALVE MV Area (PHT): 3.68 cm    TR Peak grad:   28.1 mmHg MV Decel Time: 206 msec    TR Vmax:        265.00 cm/s MV E velocity: 56.10 cm/s MV A velocity: 70.70 cm/s  SHUNTS MV E/A ratio:  0.79        Systemic VTI:  0.18 m                            Systemic Diam: 2.20 cm Vishnu Priya Mallipeddi Electronically signed by Lorelee Cover Mallipeddi Signature Date/Time: 08/05/2022/7:20:25 PM    Final    CT Angio Chest PE W/Cm &/Or Wo Cm  Result Date: 08/04/2022 CLINICAL DATA:  Concern for pulmonary embolism. Metastatic esophageal cancer. EXAM: CT ANGIOGRAPHY CHEST WITH CONTRAST TECHNIQUE: Multidetector CT imaging of the chest was performed using the standard protocol during bolus administration of intravenous contrast. Multiplanar CT image reconstructions and MIPs were obtained to evaluate the vascular anatomy. RADIATION DOSE REDUCTION: This exam was performed according to the departmental dose-optimization program which includes automated exposure control, adjustment of the mA and/or kV according to patient size and/or use of iterative reconstruction technique. CONTRAST:  70m OMNIPAQUE IOHEXOL 350 MG/ML SOLN COMPARISON:  Chest radiograph dated 08/04/2022 and CT dated 07/19/2022. FINDINGS: Evaluation of this exam is limited due to respiratory motion artifact. Cardiovascular: Mild cardiomegaly. No pericardial effusion. There  is coronary vascular calcification. Mild atherosclerotic calcification of the thoracic aorta. No aneurysmal dilatation. Evaluation of the pulmonary arteries is limited due to respiratory motion. To no pulmonary artery embolus identified. Right-sided Port-A-Cath with tip at the cavoatrial junction. Mediastinum/Nodes: No hilar or mediastinal adenopathy. The esophagus and the thyroid gland are grossly unremarkable. No mediastinal fluid collection. Lungs/Pleura: Minimal bibasilar subpleural atelectasis. A 5 mm right lung base subpleural nodule as well as a 3 mm right lung base nodule (94/6), present on the prior CT. Right lung base linear atelectasis/scarring. No focal consolidation, pleural effusion, or pneumothorax. The central airways are patent. Upper Abdomen: Multiple hepatic metastatic disease measure up to 5 x 7 cm. Ill-defined hypodense mass at the gastroesophageal junction measuring 5.5 x 4.5 cm. Several enlarged gastrohepatic lymph nodes measure 2.8 cm in short axis. 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 Several old healed bilateral rib fractures. No acute osseous pathology. Review of the MIP images confirms the above findings. IMPRESSION: 1. No acute intrathoracic pathology. No CT evidence of pulmonary artery embolus. 2. Gastroesophageal junction mass and multiple hepatic metastatic disease in keeping with history of malignancy. 3. Small right lung base pulmonary nodules as seen on the prior CT. 4.  Aortic Atherosclerosis (ICD10-I70.0). Electronically Signed   By: AAnner CreteM.D.   On: 08/04/2022 19:55   DG Chest Port 1 View  Result Date: 08/04/2022  CLINICAL DATA:  Shortness of breath and dizziness beginning today. Syncopal episode. Diaphoresis. Tachycardia. Esophageal and pancreatic carcinoma. EXAM: PORTABLE CHEST 1 VIEW COMPARISON:  05/01/2022 FINDINGS: The heart size and mediastinal contours are within normal limits. Aortic atherosclerotic calcification  incidentally noted. Right-sided power port remains in appropriate position. Both lungs are clear. IMPRESSION: No active disease. Electronically Signed   By: Marlaine Hind M.D.   On: 08/04/2022 18:59    Cardiac Studies   Echo:   1. Left ventricular ejection fraction, by estimation, is 45 to 50%. The  left ventricle has mildly decreased function. Left ventricular endocardial  border not optimally defined to evaluate regional wall motion. There is  mild left ventricular hypertrophy.   Left ventricular diastolic parameters are consistent with Grade I  diastolic dysfunction (impaired relaxation). There is the interventricular  septum is flattened in systole and diastole, consistent with right  ventricular pressure and volume overload.   2. Right ventricular systolic function is moderately reduced. The right  ventricular size is moderately enlarged. There is normal pulmonary artery  systolic pressure. The estimated right ventricular systolic pressure is  [id number] mmHg.   3. Right atrial size was moderately dilated.   4. The mitral valve is normal in structure. No evidence of mitral valve  regurgitation. No evidence of mitral stenosis.   5. The aortic valve is tricuspid. There is moderate calcification of the  aortic valve. Aortic valve regurgitation is not visualized. No aortic  stenosis is present.   6. The inferior vena cava is normal in size with greater than 50%  respiratory variability, suggesting right atrial pressure of 3 mmHg.    Patient Profile     62 y.o. male with esophageal cancer and complex past medical history presenting for sustained ventricular tachycardia.    He was admitted to the hospital in August 2023 following a successfully resuscitated out of hospital ventricular fibrillation arrest.  He was ultimately taken to the cardiac catheterization lab and found to have an occluded small nondominant RCA that was managed medically, otherwise no major obstructive stenosis.  LVEF was  50 to 55%.  He was followed by our cardiology service, although has not had outpatient follow-up since that time.   He is currently undergoing active chemotherapy for treatment of metastatic adenocarcinoma of the esophagus, had cycle 7 including oxaliplatin and trastuzumab on 07/17/2022, 5-FU was held.  Assessment & Plan    Ventricular tachycardia/VF Arrest:   Prolonged QT with hypokalemia and hypomagnesemia.   QT normalized yesterday.  Avoid QT prolonging meds. Consult EP in the AM.   Continue IV amiodarone today given the frequent ectopy.    CAD:  Medical management.     Cardiomyopathy:  Mildly reduced EF as above.   Might be slightly lower than previous.  Medical management.     Metastatic adenocarcinoma:  Per primary team.    HTN:  Holding meds secondary to dizziness.    Hx of DVT/PE:  Continue DOAC.      For questions or updates, please contact [place] Please consult [email or web address] for contact info under Cardiology/STEMI.   Signed, Minus Breeding, MD  08/06/2022, 9:03 AM

## 2022-08-06 NOTE — Progress Notes (Signed)
NAME:  [name] [name], MRN:  [id number], DOB:  13-Jul-1960, LOS: 2 ADMISSION DATE:  08/04/2022  Subjective  Patient evaluated at bedside this AM. Reports he is doing well, no chest pain, palpitations, or dyspnea overnight. He has been eating well, but reports he has not had a bowel movement recently. Otherwise discussed plan for continued IV amiodarone, follow-up with EP consult tomorrow.   Objective   Blood pressure 107/71, pulse 83, temperature (!) 97.3 F (36.3 C), temperature source Oral, resp. rate 20, height 5' 7"$  (1.702 m), weight 63 kg, SpO2 95 %.     Intake/Output Summary (Last 24 hours) at 08/06/2022 1517 Last data filed at 08/06/2022 0824 Gross per 24 hour  Intake 406.68 ml  Output 1150 ml  Net -743.32 ml    Filed Weights   08/04/22 1818 08/05/22 0052  Weight: 62 kg 63 kg   Physical Exam: General: Elderly appearing person resting in bed in no acute distress CV: Regular rate, rhythm. No murmurs appreciated. Pulm: Normal work of breathing on room air. Clear to auscultation bilaterally.  GI: Abdomen soft, non-tender, non-distended. Normoactive bowel sounds. MSK: No peripheral edema. S/P L AKA Neuro: Awake, alert, conversing appropriately. Grossly non-focal.   Labs       Latest Ref Rng & Units 08/06/2022    4:45 AM 08/05/2022    4:43 AM 08/04/2022    6:24 PM  CBC  WBC 4.0 - 10.5 K/uL 4.6  4.6  4.2   Hemoglobin 13.0 - 17.0 g/dL 9.9  10.5  12.5   Hematocrit 39.0 - 52.0 % 30.9  32.7  41.6   Platelets 150 - 400 K/uL 98  93  PLATELET CLUMPS NOTED ON SMEAR, UNABLE TO ESTIMATE       Latest Ref Rng & Units 08/06/2022    4:45 AM 08/05/2022    4:43 AM 08/05/2022   12:55 AM  BMP  Glucose 70 - 99 mg/dL 134  102  141   BUN 8 - 23 mg/dL 11  10  14   $ Creatinine 0.61 - 1.24 mg/dL 0.75  0.64  0.72   Sodium 135 - 145 mmol/L 138  136  138   Potassium 3.5 - 5.1 mmol/L 3.7  3.6  3.0   Chloride 98 - 111 mmol/L 102  103  103   CO2 22 - 32 mmol/L 25  23  25   $ Calcium 8.9 - 10.3 mg/dL  9.0  8.0  8.0     Summary  [name] [name] is a 62 y.o. with pertinent PMH of metastatic adenocarcinoma likely esophageal primary, out of hospital vfib arrest 8/23 2/2 to inferior MI, prior CVA occipital lobe, PAD s/p left AKA 2021, RA who presented with syncope due to ventricular tachycardia requiring cardioversion and admit for ventricular tachycardia on hospital day 1.   Assessment & Plan:  Principal Problem:   Ventricular tachycardia ([place])  #Sustained ventricular tachycardia s/p electrical cardioversion by EMS #History of vfib arrest in setting of Inferior MI 8/23 #Coronary artery disease Continues to be in normal sinus rhythm, asymptomatic today. Will plan to continue IV amiodarone, follow-up with EP recommendations in the morning. Electrolytes within normal limits this morning - Continue IV amiodarone - EP consult in the AM - Continue Imdur 30 mg daily - Hold metoprolol succinate 25 mg - Morning BMP, goal K >4 and Mg >2   #Chronic heart failure with reduced ejection fraction Echo yesterday with EF mildly more reduced than previous. No regional wall abnormalities seen although  not totally well visualized. Given patient's overall prognosis and co-morbidities I do not think an ischemic evaluation and work-up would be needed at this time. Appreciate cardiology's input.   #Metastatic adenocarcinoma, likely esophageal #Malnutrition Stable, will need to follow-up with oncology after discharge. Plan to have RD consult for nutritional needs.  - Continue oxycodone 38m every 4 hours as needed - RD consult - PT/OT  #Hypertension Normotensive, continue with Imdur per cardiology. Likely does not need home anti-hypertensives. - Continue Imdur 332mdaily  #History of DVT/ PE on apixaban #Peripheral arterial disease s/p L AKA 2021 - Continue home Eliquis 5 mg BID - Continue home Plavix 75 mg QD - Continue home Lipitor 40 mg QD   Rheumatoid arthritis Patient doing well on steroids and  oxycodone as needed.  - Continue prednisone 4021maily - Continue oxycodone as needed - Continue daily meloxicam - Folate supplementation   Best Practice: Diet: Heart healthy diet VTE: Eliquis Code: Full Status: Inpatient with expected length of stay greater than 2 midnights. Anticipated Discharge Location: SNF Barriers to Discharge:  Further evaluation of ventricular tachycardia  PhiSanjuan DameD PGY-3 PAGER: [phone]/2024 3:17 PM  If after hours (below), please contact on-call pager: [phone]-7AM Monday-Friday 1PM-7AM Saturday-Sunday

## 2022-08-06 NOTE — Evaluation (Signed)
Physical Therapy Evaluation Patient Details Name: [name] [name] MRN: [id number] DOB: [birth year] Today's Date: 08/06/2022  History of Present Illness  62 y.o. male adm 2/9 with a pertinent PMH of metastatic adenocarcinoma likely esophageal primary, out of hospital vfib arrest 8/23 2/2 to inferior MI, prior CVA occipital lobe, PAD s/p left AKA 2021, RA, who presented with syncope secondary to ventricular tachycardia.  Clinical Impression   Pt admitted with above diagnosis. Lives at home with roommates, in a single-level home with a ramped entrance; Prior to admission, pt was able to manage transfers and mobility with min assist, reports was working on standing and walking with his prosthesis with HHPT (had to pause HHPT due to a flu outbreak at pt's home); Presents to PT with decr functional mobility, generalized weakness; Able to get up to EOB and simulate lateral scoot transfers at EOB wit min assist; Reports he wears his prosthesis a lot during the day, and is uncomfortable performing transfers without it;  Pt currently with functional limitations due to the deficits listed below (see PT Problem List). Pt will benefit from skilled PT to increase their independence and safety with mobility to allow discharge to the venue listed below.       Pt's roommates are unable to drive in the rain; they can bring his prosthesis and wheelchair in (but tomorrow's forecast calls for rain); While he is much more comfortable performing mobility and transfers with his prosthesis, he is ultimately willing to perform lateral scooting without it; practicing with his prosthesis here in the hospital would be ideal, I don't feel strongly that he MUST practice with it in order to dc home;   If it is inclement weather at time of dc, can we consider cab voucher for getting home?     Recommendations for follow up therapy are one component of a multi-disciplinary discharge planning process, led by the attending physician.   Recommendations may be updated based on patient status, additional functional criteria and insurance authorization.  Follow Up Recommendations Home health PT      Assistance Recommended at Discharge Intermittent Supervision/Assistance  Patient can return home with the following  A little help with walking and/or transfers;A little help with bathing/dressing/bathroom;Assistance with cooking/housework;Assist for transportation    Equipment Recommendations Other (comment) (sliding board)  Recommendations for Other Services  OT consult    Functional Status Assessment Patient has had a recent decline in their functional status and demonstrates the ability to make significant improvements in function in a reasonable and predictable amount of time.     Precautions / Restrictions Precautions Precautions: Fall Restrictions Weight Bearing Restrictions: No      Mobility  Bed Mobility Overal bed mobility: Needs Assistance Bed Mobility: Supine to Sit, Sit to Supine     Supine to sit: Min assist Sit to supine: Supervision   General bed mobility comments: Min handheld assist to pull to sit    Transfers Overall transfer level: Needs assistance Equipment used: None Transfers: Bed to chair/wheelchair/BSC            Lateral/Scoot Transfers: Min assist General transfer comment: Simulated lateral scoot transfer, scooting towards HOB; short, inefficient scoots    Ambulation/Gait                  Stairs            Wheelchair Mobility    Modified Rankin (Stroke Patients Only)       Balance     Sitting balance-Leahy Scale: Fair  Pertinent Vitals/Pain      Home Living Family/patient expects to be discharged to:: Private residence Living Arrangements: Non-relatives/Friends Available Help at Discharge: Friend(s);Available 24 hours/day Type of Home: House Home Access: Ramped entrance       Home  Layout: One level Home Equipment: Tub bench;Grab bars - tub/shower;Rolling Walker (2 wheels);Wheelchair - manual;BSC/3in1;Hand held shower head Additional Comments: Roommate's car lacks windsheild wipers, can't drive in the rain    Prior Function Prior Level of Function : Needs assist             Mobility Comments: since prior hospitalizationpt requiring assist for transfers to and from wheelchair and car. ADLs Comments: Friends manage IADLs, intermittent assist for ADLs (tub transfers, LB dressing as needed), friends assist with transportation     Hand Dominance   Dominant Hand: Left    Extremity/Trunk Assessment   Upper Extremity Assessment Upper Extremity Assessment: Defer to OT evaluation    Lower Extremity Assessment Lower Extremity Assessment: Generalized weakness;LLE deficits/detail LLE Deficits / Details: L AKA; typically wears a prosthesis for mobility; is uncomfortable with performing transfers without prosthesis, but willing    Cervical / Trunk Assessment Cervical / Trunk Assessment: Normal  Communication   Communication: No difficulties  Cognition Arousal/Alertness: Awake/alert Behavior During Therapy: WFL for tasks assessed/performed Overall Cognitive Status: Within Functional Limits for tasks assessed                                          General Comments General comments (skin integrity, edema, etc.): HR 84, O2 sats 95% on room air; BP 133/94 sitting EOB    Exercises     Assessment/Plan    PT Assessment Patient needs continued PT services  PT Problem List Decreased strength;Decreased range of motion;Decreased activity tolerance;Decreased balance;Decreased mobility;Decreased coordination;Decreased knowledge of use of DME;Decreased knowledge of precautions;Cardiopulmonary status limiting activity       PT Treatment Interventions DME instruction;Gait training;Functional mobility training;Therapeutic activities;Therapeutic  exercise;Balance training;Neuromuscular re-education;Patient/family education;Wheelchair mobility training    PT Goals (Current goals can be found in the Care Plan section)  Acute Rehab PT Goals Patient Stated Goal: Hopes to get home soon PT Goal Formulation: With patient Time For Goal Achievement: 08/20/22 Potential to Achieve Goals: Good    Frequency Min 3X/week     Co-evaluation               AM-PAC PT "6 Clicks" Mobility  Outcome Measure Help needed turning from your back to your side while in a flat bed without using bedrails?: None Help needed moving from lying on your back to sitting on the side of a flat bed without using bedrails?: A Little Help needed moving to and from a bed to a chair (including a wheelchair)?: A Lot Help needed standing up from a chair using your arms (e.g., wheelchair or bedside chair)?: A Lot Help needed to walk in hospital room?: Total Help needed climbing 3-5 steps with a railing? : Total 6 Click Score: 13    End of Session Equipment Utilized During Treatment:  (bed pad) Activity Tolerance: Patient tolerated treatment well Patient left: in bed;with call bell/phone within reach;with bed alarm set (sitting EOB) Nurse Communication: Mobility status PT Visit Diagnosis: Unsteadiness on feet (R26.81);Other abnormalities of gait and mobility (R26.89);Muscle weakness (generalized) (M62.81);Difficulty in walking, not elsewhere classified (R26.2)    Time: [id number] PT Time Calculation (min) (ACUTE ONLY): 27 min  Charges:   PT Evaluation $PT Eval Moderate Complexity: 1 Mod PT Treatments $Therapeutic Activity: 8-22 mins        Roney Marion, PT  Acute Rehabilitation Services Office [phone]   [place] 08/06/2022, 2:00 PM

## 2022-08-07 DIAGNOSIS — I472 Ventricular tachycardia, unspecified: Secondary | ICD-10-CM | POA: Diagnosis not present

## 2022-08-07 LAB — BASIC METABOLIC PANEL
Anion gap: 9 (ref 5–15)
BUN: 14 mg/dL (ref 8–23)
CO2: 26 mmol/L (ref 22–32)
Calcium: 8.7 mg/dL — ABNORMAL LOW (ref 8.9–10.3)
Chloride: 104 mmol/L (ref 98–111)
Creatinine, Ser: 0.71 mg/dL (ref 0.61–1.24)
GFR, Estimated: >60 mL/min (ref >60)
Glucose, Bld: 102 mg/dL — ABNORMAL HIGH (ref 70–99)
Potassium: 2.9 mmol/L — ABNORMAL LOW (ref 3.5–5.1)
Sodium: 139 mmol/L (ref 135–145)

## 2022-08-07 LAB — CBC
HCT: 34.3 % — ABNORMAL LOW (ref 39.0–52.0)
Hemoglobin: 10.7 g/dL — ABNORMAL LOW (ref 13.0–17.0)
MCH: 28.1 pg (ref 26.0–34.0)
MCHC: 31.2 g/dL (ref 30.0–36.0)
MCV: 90 fL (ref 80.0–100.0)
Platelets: 112 10*3/uL — ABNORMAL LOW (ref 150–400)
RBC: 3.81 MIL/uL — ABNORMAL LOW (ref 4.22–5.81)
RDW: 18.6 % — ABNORMAL HIGH (ref 11.5–15.5)
WBC: 6.5 10*3/uL (ref 4.0–10.5)
nRBC: 0 % (ref 0.0–0.2)

## 2022-08-07 LAB — MAGNESIUM: Magnesium: 1.4 mg/dL — ABNORMAL LOW (ref 1.7–2.4)

## 2022-08-07 LAB — GLUCOSE, CAPILLARY: Glucose-Capillary: 95 mg/dL (ref 70–99)

## 2022-08-07 MED ORDER — OXYCODONE HCL 5 MG PO TABS
10.0000 mg | ORAL_TABLET | ORAL | Status: DC | PRN
  Administered 2022-08-07 – 2022-08-09 (×8): 10 mg via ORAL
  Filled 2022-08-07 (×8): qty 2

## 2022-08-07 MED ORDER — POTASSIUM CHLORIDE CRYS ER 20 MEQ PO TBCR
40.0000 meq | EXTENDED_RELEASE_TABLET | ORAL | Status: AC
  Administered 2022-08-07 (×2): 40 meq via ORAL
  Filled 2022-08-07 (×2): qty 2

## 2022-08-07 MED ORDER — POTASSIUM CHLORIDE 10 MEQ/100ML IV SOLN: 10.0000 meq | Freq: Once | INTRAVENOUS | Status: AC

## 2022-08-07 MED ORDER — ENSURE ENLIVE PO LIQD
237.0000 mL | Freq: Three times a day (TID) | ORAL | Status: DC
  Administered 2022-08-08 (×2): 237 mL via ORAL

## 2022-08-07 MED ORDER — MAGNESIUM SULFATE 2 GM/50ML IV SOLN: 2.0000 g | Freq: Once | INTRAVENOUS | Status: DC

## 2022-08-07 MED ORDER — FERROUS SULFATE 325 (65 FE) MG PO TABS
325.0000 mg | ORAL_TABLET | Freq: Every day | ORAL | Status: DC
  Administered 2022-08-07 – 2022-08-09 (×3): 325 mg via ORAL
  Filled 2022-08-07 (×3): qty 1

## 2022-08-07 MED ORDER — POTASSIUM CHLORIDE 10 MEQ/100ML IV SOLN
10.0000 meq | Freq: Once | INTRAVENOUS | Status: AC
  Administered 2022-08-07: 10 meq via INTRAVENOUS
  Filled 2022-08-07: qty 100

## 2022-08-07 MED ORDER — POTASSIUM CHLORIDE CRYS ER 20 MEQ PO TBCR: 40.0000 meq | EXTENDED_RELEASE_TABLET | Freq: Four times a day (QID) | ORAL | Status: DC

## 2022-08-07 MED ORDER — MAGNESIUM SULFATE 4 GM/100ML IV SOLN
4.0000 g | Freq: Once | INTRAVENOUS | Status: AC
  Administered 2022-08-07: 4 g via INTRAVENOUS
  Filled 2022-08-07: qty 100

## 2022-08-07 MED ORDER — LIDOCAINE 5 % EX PTCH
1.0000 | MEDICATED_PATCH | CUTANEOUS | Status: DC
  Administered 2022-08-07 – 2022-08-09 (×3): 1 via TRANSDERMAL
  Filled 2022-08-07 (×3): qty 1

## 2022-08-07 MED ORDER — ADULT MULTIVITAMIN W/MINERALS CH
1.0000 | ORAL_TABLET | Freq: Every day | ORAL | Status: DC
  Administered 2022-08-08 – 2022-08-09 (×2): 1 via ORAL
  Filled 2022-08-07 (×2): qty 1

## 2022-08-07 MED ORDER — MAGNESIUM OXIDE -MG SUPPLEMENT 400 (240 MG) MG PO TABS
400.0000 mg | ORAL_TABLET | Freq: Every day | ORAL | Status: DC
  Administered 2022-08-08 – 2022-08-09 (×2): 400 mg via ORAL
  Filled 2022-08-07 (×2): qty 1

## 2022-08-07 MED ORDER — DICLOFENAC SODIUM 1 % EX GEL
2.0000 g | Freq: Four times a day (QID) | CUTANEOUS | Status: DC
  Administered 2022-08-07 – 2022-08-09 (×7): 2 g via TOPICAL
  Filled 2022-08-07: qty 100

## 2022-08-07 MED ORDER — MAGNESIUM SULFATE 4 GM/100ML IV SOLN: 4.0000 g | Freq: Once | INTRAVENOUS | Status: DC

## 2022-08-07 MED ORDER — POTASSIUM CHLORIDE 10 MEQ/100ML IV SOLN
10.0000 meq | INTRAVENOUS | Status: AC
  Administered 2022-08-07: 10 meq via INTRAVENOUS
  Filled 2022-08-07: qty 100

## 2022-08-07 NOTE — TOC Progression Note (Signed)
Transition of Care [facility]) - Progression Note    Patient Details  Name: [name] [name] MRN: [id number] Date of Birth: 08-24-1960  [facility] CM/SW Contact  Zenon Mayo, RN Phone Number: 08/07/2022, 3:54 PM  Clinical Narrative:    From home, presented with syncope secondary to ventricular tachycardia.  TOC following.        Expected Discharge Plan and Services                                               Social Determinants of Health (SDOH) Interventions SDOH Screenings   Food Insecurity: No Food Insecurity (08/05/2022)  Housing: Low Risk  (08/05/2022)  Transportation Needs: Unmet Transportation Needs (08/05/2022)  Utilities: Not At Risk (08/05/2022)  Depression (PHQ2-9): Low Risk  (06/15/2022)  Financial Resource Strain: Low Risk  (05/05/2022)  Tobacco Use: Medium Risk (08/05/2022)    Readmission Risk Interventions    05/02/2022    4:00 PM 03/15/2022    1:24 PM 05/13/2020    2:32 PM  Readmission Risk Prevention Plan  Transportation Screening Complete Complete Complete  PCP or Specialist Appt within 5-7 Days   Complete  Home Care Screening   Complete  Medication Review (RN CM)   Complete  Medication Review Press photographer) Complete Complete   HRI or Home Care Consult Complete Complete   SW Recovery Care/Counseling Consult Complete Complete   Palliative Care Screening Not Applicable Not [place] Complete Not Applicable

## 2022-08-07 NOTE — Progress Notes (Signed)
NAME:  [name] [name], MRN:  [id number], DOB:  1960-07-09, LOS: 3 ADMISSION DATE:  08/04/2022  Patient reports that he is feeling well overall today. He continues to have chest pain that is mildly improved from yesterday. Patient reports that his appetite is good. Denies difficulty swallowing. Patient reports that he has not had a bowel movement yet. Discussed plan to f/u on cardiology recommendations today.   Objective   Blood pressure (!) 123/93, pulse 76, temperature 97.9 F (36.6 C), temperature source Oral, resp. rate 20, height 5' 7"$  (1.702 m), weight 63 kg, SpO2 95 %.     Intake/Output Summary (Last 24 hours) at 08/07/2022 0939 Last data filed at 08/07/2022 [id number] Gross per 24 hour  Intake 364.43 ml  Output 1700 ml  Net -1335.57 ml   Filed Weights   08/04/22 1818 08/05/22 0052  Weight: 62 kg 63 kg   Physical Exam: General: Elderly male appearing person resting in bed in no acute distress CV: Regular rate, rhythm. No murmurs/rubs/gallops appreciated. Pulm: Normal work of breathing on room air. Clear to auscultation bilaterally.  GI: Abdomen soft, non-tender, non-distended. Normoactive bowel sounds. MSK: No peripheral edema. S/P L AKA Neuro: Awake, alert, conversing appropriately. Grossly non-focal.   Labs       Latest Ref Rng & Units 08/06/2022    4:45 AM 08/05/2022    4:43 AM 08/04/2022    6:24 PM  CBC  WBC 4.0 - 10.5 K/uL 4.6  4.6  4.2   Hemoglobin 13.0 - 17.0 g/dL 9.9  10.5  12.5   Hematocrit 39.0 - 52.0 % 30.9  32.7  41.6   Platelets 150 - 400 K/uL 98  93  PLATELET CLUMPS NOTED ON SMEAR, UNABLE TO ESTIMATE       Latest Ref Rng & Units 08/06/2022    4:45 AM 08/05/2022    4:43 AM 08/05/2022   12:55 AM  BMP  Glucose 70 - 99 mg/dL 134  102  141   BUN 8 - 23 mg/dL 11  10  14   $ Creatinine 0.61 - 1.24 mg/dL 0.75  0.64  0.72   Sodium 135 - 145 mmol/L 138  136  138   Potassium 3.5 - 5.1 mmol/L 3.7  3.6  3.0   Chloride 98 - 111 mmol/L 102  103  103   CO2 22 - 32 mmol/L 25   23  25   $ Calcium 8.9 - 10.3 mg/dL 9.0  8.0  8.0     Summary  [name] [name] is a 62 y.o. with pertinent PMH of metastatic adenocarcinoma likely esophageal primary, out of hospital vfib arrest 8/23 2/2 to inferior MI, prior CVA occipital lobe, PAD s/p left AKA 2021, RA who presented with syncope due to ventricular tachycardia requiring cardioversion and admit for ventricular tachycardia.  Assessment & Plan:  Principal Problem:   Ventricular tachycardia ([place])  #Sustained ventricular tachycardia s/p electrical cardioversion by EMS #History of vfib arrest in setting of Inferior MI 8/23 #Coronary artery disease Normal sinus rhythm on monitor, asymptomatic. Morning BMP shows, K+ 2.9 and Mg 1.4, will replenish.  - Appreciate Cardiology's care and recommendations - KCl 40 mEq x2 - Mg-Ox 400 mg - Continue Amiodarone IV - Continue Imdur 30 mg daily - Morning BMP and Mg, goal K >4 and Mg >2   #Chronic heart failure with reduced ejection fraction Echo yesterday with EF mildly more reduced than previous, now 45-50%. No ischemic evaluation and work-up needed at this time. Appreciate cardiology's input.   #  Metastatic adenocarcinoma, likely esophageal #Malnutrition Stable, will need to follow-up with oncology after discharge, per patient he has upcoming appointment on Feb 19th. Plan to have RD consult for nutritional needs.  - Continue Oxycodone 68m every 4 hours as needed - F/u RD consult - PT/OT  #Hypertension Normotensive, continue with Imdur per cardiology. Likely does not need home anti-hypertensives. - Continue Imdur 397mdaily  #History of DVT/ PE on apixaban #Peripheral arterial disease s/p L AKA 2021 - Continue home Eliquis 5 mg BID - Continue home Plavix 75 mg QD - Continue home Lipitor 40 mg QD   Rheumatoid arthritis Patient doing well on steroids and oxycodone as needed, continues to endorse pain in R hand. - Start Voltaren gel 4 times daily for pain - Continue prednisone  4063mDay 3/5 - Continue oxycodone as needed - Continue daily meloxicam - Folate supplementation   Best Practice: Diet: Heart healthy diet VTE: Eliquis Code: Full Status: Inpatient with expected length of stay greater than 2 midnights. Anticipated Discharge Location: SNF Barriers to Discharge:  Further evaluation of ventricular tachycardia  MarChristene SlatesD PGY-1 PAGER: [phone]/2024 9:39 AM  If after hours (below), please contact on-call pager: [phone]-7AM Monday-Friday 1PM-7AM Saturday-Sunday

## 2022-08-07 NOTE — Progress Notes (Signed)
Heart Failure Navigator Progress Note  Assessed for Heart & Vascular TOC clinic readiness.  Patient 45-50 % EF, Metastatic cancer of the esophagus.   Navigator will sign off at this time.   Earnestine Leys, BSN, Clinical cytogeneticist Only

## 2022-08-07 NOTE — Progress Notes (Signed)
PT Cancellation Note  Patient Details Name: [name] [name] MRN: [id number] DOB: 07-Jun-1961   Cancelled Treatment:    Reason Eval/Treat Not Completed: (P) Other (comment) Attempted x2, pt eating lunch during 1st visit, and refused second attempt reporting he wants to wait until his friends bring his prosthetic to get out of bed. PT will follow back tomorrow.  Dazia Lippold B. Migdalia Dk PT, DPT Acute Rehabilitation Services Please use secure chat or  Call Office [phone]    [place] 08/07/2022, 3:52 PM

## 2022-08-07 NOTE — Consult Note (Addendum)
ELECTROPHYSIOLOGY CONSULT NOTE    Patient ID: [name] [name] MRN: [id number], DOB/AGE: 1961/03/18 62 y.o.  Admit date: 08/04/2022 Date of Consult: 08/07/2022  Primary Physician: Angelique Blonder, DO Primary Cardiologist: None  Electrophysiologist: Dr. Lovena Le  (Seen during admission 01/2022 for STEMI/VF)  Referring Provider: Dr. Percival Spanish  Patient Profile: [name] [name] is a 62 y.o. male with a history of CAD, h/o DVT/PE, Metastatic adenocarcinoma, s/p left AKA, Chronic anemia, and h/o VT arrest in setting of STEMI who is being seen today for the evaluation of VF arrest at the request of Dr. Percival Spanish.  HPI:  [name] [name] is a 62 y.o. male with medical history as above.   Pt presented to [facility] 2/9 via EMS with intermittent dizziness. He had a significant, near syncopal episode while transferring to use the bathroom and had some memory loss around the situation, and his friend called EMS. On EMS arrival he was found to be in VT requiring a single shock to convert to NSR.  Felt better on arrival to EMS, but did have frequent ventricular ectopy.     Labs on arrival with K 2.6 and Mg 1.7. Mag has not been rechecked. K has improved to 3.7 2/11, pending today.  Started on IV amiodarone and EP asked to see for recommendations.   Pt states he is feeling OK today, near baseline.  Last chemo was 2 weeks ago, next due 2/19. He states he did have several episodes of vomiting prior to his syncopal episode. Denies diarrhea. Denies recent illness, fever, or chills. Has a R chest port.   Labs are pending today.   Labs Potassium3.7 (02/11 0445) Magnesium  1.7 (02/09 1828) Creatinine, ser  0.75 (02/11 0445) PLT  98* (02/11 0445) HGB  9.9* (02/11 0445) WBC 4.6 (02/11 0445) Troponin I (High Sensitivity)74* (02/10 0751).    Past Medical History:  Diagnosis Date   Acute respiratory failure ([place]) 01/31/2022   Bilateral shoulder pain 08/20/2012   Bite wound of left hand 09/27/2021   CAD (coronary  artery disease)    Occluded nondominent RCA managed medically - August 2023   Cardiac arrest [facility])    OOH VF - August 2023   Community acquired pneumonia 11/07/2011   Critical limb ischemia with history of revascularization of same extremity ([place]) 04/28/2020   DDD (degenerative disc disease), lumbosacral     and grade 2 slip   DVT (deep venous thrombosis) ([place]) 01/31/2022   Esophageal cancer (HCC)    Adenocarcinoma with metastasis   GERD (gastroesophageal reflux disease)    History of anemia    History of COVID-19 05/10/2019   History of critical lower limb ischemia 02/19/2019   Hypertension    Impingement syndrome of shoulder region    Ischemic ulcer of toe of left foot (HCC)    Liver mass    Low back pain without sciatica    Lupus ([place])    Multiple fractures of ribs, bilateral, due to CPR trauma    Onychomycosis    Paronychia of great toe    Peripheral vascular disease (HCC)    Pressure injury of skin    Pulmonary embolus (HCC)    RA (rheumatoid arthritis) ([place])    Rotator cuff tear 11/04/2014   Sternal fracture    Stroke [facility])      Surgical History:  Past Surgical History:  Procedure Laterality Date   ABDOMINAL AORTAGRAM  02/20/2019   ABDOMINAL AORTOGRAM W/LOWER EXTREMITY N/A 02/20/2019   Procedure: ABDOMINAL AORTOGRAM W/LOWER EXTREMITY;  Surgeon: Marty Heck, MD;  Location: [place] CV LAB;  Service: Cardiovascular;  Laterality: N/A;   ACHILLES TENDON SURGERY Bilateral    AMPUTATION Left 05/11/2020   Procedure: LEFT ABOVE KNEE AMPUTATION;  Surgeon: Angelia Mould, MD;  Location: [place];  Service: Vascular;  Laterality: Left;   ENDARTERECTOMY POPLITEAL Left 05/01/2020   Procedure: ENDARTERECTOMY POPLITEAL;  Surgeon: Waynetta Sandy, MD;  Location: [place];  Service: Vascular;  Laterality: Left;   FASCIOTOMY Left 05/01/2020   Procedure: FASCIOTOMY;  Surgeon: Waynetta Sandy, MD;  Location: [place];  Service: Vascular;  Laterality: Left;    FEMORAL-POPLITEAL BYPASS GRAFT Left 02/26/2019   Procedure: BYPASS GRAFT FEMORAL-POPLITEAL ARTERY LEFT LEG USING 83m PROPATEN GRAFT;  Surgeon: BSerafina Mitchell MD;  [place]  Service: Vascular;  Laterality: Left;   INTRAOPERATIVE ARTERIOGRAM Left 01/22/2020   Procedure: INTRA OPERATIVE ARTERIOGRAM with administration of thrombolyics in Left femoral to popliteal bypass.;  Surgeon: CMarty Heck MD;  [place]  Service: Vascular;  Laterality: Left;   IR IMAGING GUIDED PORT INSERTION  03/07/2022   LEFT HEART CATH AND CORONARY ANGIOGRAPHY N/A 03/15/2017   Procedure: LEFT HEART CATH AND CORONARY ANGIOGRAPHY;  Surgeon: ENelva Bush MD;  [place];  Service: Cardiovascular;  Laterality: N/A;   LEFT HEART CATH AND CORONARY ANGIOGRAPHY N/A 02/06/2022   Procedure: LEFT HEART CATH AND CORONARY ANGIOGRAPHY;  Surgeon: JMartinique Peter M, MD;  [place];  Service: Cardiovascular;  Laterality: N/A;   LOWER EXTREMITY INTERVENTION Left 04/29/2020   Procedure: LOWER EXTREMITY INTERVENTION- LYSIS;  Surgeon: CMarty Heck MD;  [place];  Service: Cardiovascular;  Laterality: Left;   OSTEOTOMY AND ULNAR SHORTENING Right 07/22/2002   PATCH ANGIOPLASTY Left 05/01/2020   Procedure: PATCH ANGIOPLASTY USING HEMASHIELD PLATINUM FINESSE PATCH;  Surgeon: CWaynetta Sandy MD;  [place]  Service: Vascular;  Laterality: Left;   PERIPHERAL VASCULAR ATHERECTOMY  01/23/2020   Procedure: PERIPHERAL VASCULAR ATHERECTOMY;  Surgeon: CMarty Heck MD;  [place];  Service: Cardiovascular;;   PERIPHERAL VASCULAR BALLOON ANGIOPLASTY  01/23/2020   Procedure: PERIPHERAL VASCULAR BALLOON ANGIOPLASTY;  Surgeon: CMarty Heck MD;  [place];  Service: Cardiovascular;;   PERIPHERAL VASCULAR INTERVENTION Left 04/30/2020   Procedure: PERIPHERAL VASCULAR INTERVENTION;  Surgeon: HCherre Robins  MD;  [place];  Service: Cardiovascular;  Laterality: Left;   PERIPHERAL VASCULAR THROMBECTOMY N/A 01/23/2020   Procedure: lysis recheck;  Surgeon: CMarty Heck MD;  [place];  Service: Cardiovascular;  Laterality: N/A;  + [place]    PERIPHERAL VASCULAR THROMBECTOMY N/A 04/30/2020   Procedure: Lysis Recheck;  Surgeon: HCherre Robins MD;  [place];  Service: Cardiovascular;  Laterality: N/A;   SHOULDER ARTHROSCOPY WITH BICEPSTENOTOMY Right 03/11/2015   Procedure: SHOULDER ARTHROSCOPY WITH BICEPSTENOTOMY;  Surgeon: DNinetta Lights MD;  [place]  Service: Orthopedics;  Laterality: Right;   SHOULDER ARTHROSCOPY WITH DISTAL CLAVICLE RESECTION Left 11/19/2014   Procedure: SHOULDER ARTHROSCOPY WITH DISTAL CLAVICLE RESECTION;  Surgeon: DKathryne Hitch MD;  [place]  Service: Orthopedics;  Laterality: Left;   SHOULDER ARTHROSCOPY WITH ROTATOR CUFF REPAIR AND SUBACROMIAL DECOMPRESSION Left 11/19/2014   Procedure: LEFT SHOULDER ARTHROSCOPY, DEBRIDEMENT DISTAL CLAVICLE EXCISION, ACROMIOPLASTY WITH ROTATOR CUFF REPAIR ;  Surgeon: DKathryne Hitch MD;  [place]  Service: Orthopedics;  Laterality: Left;   THROMBECTOMY OF BYPASS GRAFT FEMORAL- POPLITEAL ARTERY Left 05/01/2020   Procedure: THROMBECTOMY OF LOWER EXTREMITY;  Surgeon: Waynetta Sandy, MD;  Location: [place];  Service: Vascular;  Laterality: Left;   TRANSFORAMINAL LUMBAR INTERBODY FUSION (TLIF) WITH PEDICLE SCREW FIXATION 1 LEVEL N/A 01/03/2018   Procedure: TRANSFORAMINAL LUMBAR INTERBODY FUSION (TLIF) LUMBAR FIVE-SACRAL ONE;  Surgeon: Melina Schools, MD;  Location: [place];  Service: Orthopedics;  Laterality: N/A;   ULTRASOUND GUIDANCE FOR VASCULAR ACCESS Right 01/22/2020   Procedure: ULTRASOUND GUIDANCE FOR VASCULAR ACCESS Right Common Femoral Artery.;  Surgeon: Marty Heck, MD;  Location: [place];  Service: Vascular;  Laterality: Right;   VIDEO ASSISTED THORACOSCOPY (VATS)/DECORTICATION Right 11/21/2011   drainage of empyema     Medications Prior to Admission  Medication Sig Dispense Refill Last Dose   albuterol (VENTOLIN HFA) 108 (90 Base) MCG/ACT inhaler Inhale 2 puffs into the lungs every 6 (six) hours as needed for wheezing or shortness of breath. 8 g 2    amLODipine (NORVASC) 10 MG tablet Take 1 tablet (10 mg total) by mouth daily. 90 tablet 3    apixaban (ELIQUIS) 5 MG TABS tablet Take 1 tablet (5 mg total) by mouth 2 (two) times daily. 60 tablet 11    atorvastatin (LIPITOR) 40 MG tablet Take 1 tablet (40 mg total) by mouth daily at 6 PM. 90 tablet 3    clopidogrel (PLAVIX) 75 MG tablet Take 1 tablet (75 mg total) by mouth daily. 90 tablet 3    DULoxetine (CYMBALTA) 30 MG capsule Take 30 mg by mouth daily.      folic acid (FOLVITE) 1 MG tablet Take 1 tablet (1 mg total) by mouth daily. 90 tablet 3    gabapentin (NEURONTIN) 100 MG capsule Take 1 capsule (100 mg total) by mouth See admin instructions. Daily or twice daily if tolerated. 90 capsule 0    isosorbide mononitrate (IMDUR) 30 MG 24 hr tablet Take 1 tablet (30 mg total) by mouth daily. 90 tablet 0    lidocaine-prilocaine (EMLA) cream Apply 1 Application topically as directed. Apply to port site 2 hours prior to stick and cover with Press-and-Seal to numb port before access 30 g 1    meloxicam (MOBIC) 7.5 MG tablet TAKE 1 TABLET (7.5 MG TOTAL) BY MOUTH DAILY AS NEEDED FOR PAIN. 15 tablet 0    methotrexate (RHEUMATREX) 7.5 MG tablet Take 1 tablet (7.5 mg total) by mouth once a week. Caution:Chemotherapy. Protect from light. 4 tablet 3    metoprolol succinate (TOPROL-XL) 25 MG 24 hr tablet Take 1 tablet (25 mg total) by mouth daily. 90 tablet 3    ondansetron (ZOFRAN) 8 MG tablet TAKE 1 TABLET(8 MG) BY MOUTH EVERY 8 HOURS AS NEEDED FOR NAUSEA OR VOMITING 20 tablet 0    oxyCODONE-acetaminophen (PERCOCET/ROXICET) 5-325 MG tablet  Take 1 tablet by mouth every 6 (six) hours as needed for severe pain. 120 tablet 0    potassium chloride (KLOR-CON M) 10 MEQ tablet TAKE 2 TABLETS(20 MEQ) BY MOUTH DAILY 180 tablet 0    prochlorperazine (COMPAZINE) 10 MG tablet Take 1 tablet (10 mg total) by mouth every 8 (eight) hours as needed for refractory nausea / vomiting. TAKE 1 TABLET(10 MG) BY MOUTH EVERY 6 HOURS AS NEEDED FOR NAUSEA OR VOMITING Strength: 10 mg 60 tablet 0     Inpatient Medications:   apixaban  5 mg Oral BID   atorvastatin  40 mg Oral q1800   Chlorhexidine  Gluconate Cloth  6 each Topical Daily   clopidogrel  75 mg Oral Daily   folic acid  1 mg Oral Daily   isosorbide mononitrate  30 mg Oral Daily   meloxicam  7.5 mg Oral Q breakfast   predniSONE  40 mg Oral Q breakfast   senna-docusate  1 tablet Oral QHS    Allergies:  Allergies  Allergen Reactions   Dilaudid [Hydromorphone Hcl] Rash   Ramipril Rash and Other (See Comments)    Per patient on R arm and leg only; no angioedema   Tramadol Itching and Rash    Family History  Problem Relation Age of Onset   Heart attack Father 69   Alcohol abuse Father    Aneurysm Mother    Stroke Neg Hx    Cancer Neg Hx      Physical Exam: Vitals:   08/06/22 1457 08/06/22 1633 08/06/22 1950 08/07/22 0323  BP: 107/71 123/89 133/82 (!) 145/99  Pulse: 83 79 75 74  Resp: 20 18 19 19  $ Temp: (!) 97.3 F (36.3 C) 97.7 F (36.5 C) (!) 97.5 F (36.4 C) 97.9 F (36.6 C)  TempSrc: Oral Oral Oral Oral  SpO2: 95% 95% 96% 97%  Weight:      Height:        GEN- NAD, A&O x 3, normal affect HEENT: Normocephalic, atraumatic Lungs- CTAB, Normal effort.  Heart- Regular rate and rhythm, No M/G/R.  Skin - R chest port. GI- Soft, NT, ND.  Extremities- No clubbing, cyanosis, or edema   EKG:on arrival showed sinus tachycardia at 107 bpm, QT ~440-460 (personally reviewed)  TELEMETRY: NSR 70-80s (personally reviewed)  Assessment/Plan: 1.  Ventricular tachycardia 2. VF  arrest In the setting of prolonged QT with hypokalemia and hypomagnesemia  EKG pending to re-assess QT today.  Continue IV amiodarone at least this am. Started on 2/10. Given likely reversible cause, will discuss with MD if we transition to po or discontinue.  Prior VT was in setting of STEMI, now likely in setting of electrolyte imbalance, but also likely with scar from prior RCA occlusion.  ? Potential benefit of cMRI With potentially reversible cause in electrolytes, R chest port (infection risk), and unclear prognosis given mixed response to chemo by CT I am not sure he meets criteria for secondary prevention with ICD. Medical management may be best option. Will discuss with MD.   ADDENDUM K 2.9 and Mg 1.4. Will aggressively supp and potentially plan continuing IV amiodarone today.   EP attending  Patient seen and examined.  Agree with the findings as noted above.  The patient is a very unfortunate middle-age man with an ischemic cardiomyopathy, status post prior STEMI, who has metastatic adenocarcinoma of the esophagus.  The patient presented to the hospital with sustained monomorphic ventricular tachycardia with hemodynamic instability.  He was defibrillated.  He has been placed on intravenous amiodarone.  He was referred for additional evaluation.  Because of his need for chemotherapy, he has an indwelling port in the right internal jugular vein which is tunneled to the right chest.  He has hypomagnesemia and hypokalemia.  He has been repleted for both.  In addition his ventricular tachycardia has quieted down with intravenous amiodarone.  We do not have a twelve-lead EKG of his tachycardia but rhythm strips from the paramedics demonstrated a wide QRS tachycardia at over 200 bpm.  On exam he is a chronically ill and discomfort appearing middle-age man who looks older than his stated age.  He  is status post a left AKA.  Telemetry demonstrates sinus rhythm.  His twelve-lead EKG demonstrates  frequent PVCs.  His chest exam demonstrates an indwelling port as previously mentioned.  Lungs revealed rales bilaterally but no increased work of breathing and no rhonchi or wheezes were present.   3. CAD LHC 01/2022 with occlusion of proximal non-dominant RCA. Medical management planned  4. Cardiomyopathy Echo 08/05/22 LVEF 45-50%, Grade 1 DD, RV moderately reduced  5. Metastatic adenocarcinoma Per primary team Per Heme/Onc note pt has liver mass vs lymph node conglomerate near the GE junction, gastrohepatic ligament, and retroperitoneal lymphadenopathy CT 07/19/22 Interval decrease of anterior liver lesion, though with at least two new small liver lesions and new RLL nodule "highly suspicious" for a small pulmonary metastasis.   6. H/o DVT/PE Continue DOAC  For questions or updates, please contact [facility] Please consult [email or web address] for contact info under Cardiology/STEMI.  Jacalyn Lefevre, PA-C  08/07/2022 7:58 AM    Assessment and plan -  1.  Sustained monomorphic ventricular tachycardia with hemodynamic instability -the patient's multiple comorbidities along with his indwelling port may cannot a candidate for ICD implantation.  I would recommend continued intravenous amiodarone today with transition to oral amiodarone tomorrow at 400 mg twice a day.  I will treat him for 1 week with the high-dose 400 twice daily regimen followed by reduction to 400 mg daily.  The patient states that he has no difficulty swallowing pills. 2.  Esophageal cancer -by report he has a fairly advanced case.  His overall longevity is not particularly good and he has an indwelling port for chemotherapy and would not be a candidate for ICD insertion with the indwelling catheter and poor prognosis. 3.  Chronic systolic heart failure -he appears to be well compensated class II at this time.  Cristopher Peru, MD

## 2022-08-07 NOTE — Progress Notes (Signed)
Initial Nutrition Assessment  DOCUMENTATION CODES:   Severe malnutrition in context of chronic illness  INTERVENTION:  Liberalize diet to regular to remove restrictions and encourage oral intake.  Provide Ensure Enlive po TID, each supplement provides 350 kcal and 20 grams of protein. Pt enjoys all flavors of Ensure.  Provide multivitamin with minerals po daily.  NUTRITION DIAGNOSIS:   Severe Malnutrition related to chronic illness (metastatic adenocarcinoma, HF) as evidenced by 12.5% weight loss over 5 months, severe fat depletion, severe muscle depletion.  GOAL:   Patient will meet greater than or equal to 90% of their needs  MONITOR:   PO intake, Supplement acceptance, Labs, Weight trends, I & O's  REASON FOR ASSESSMENT:   Consult Assessment of nutrition requirement/status  ASSESSMENT:   62 year old male with PMHx of metastatic adenocarcinoma likely esophageal primary, HF, HTN, out of hospital V-fib arrest 02/15/22 secondary to inferior MI, prior CVA occipital lobe, PAD s/p left AKA 2021, RA who presented with syncope due to ventricular tachycardia requiring cardioversion.  Met with pt at bedside. He reports his appetite has been decreased about 4-5 days. He reports it is starting to return now. He reports eating 100% of lunch today. He typically eats 3 meals per day + snacks between meals. For breakfast he has eggs with sausage and gravy. For lunch he has grilled cheese or hot dog. For dinner he has chicken. For snacks he may have a cookie. He also drinks 2 bottles of Ensure daily and likes vanilla, chocolate, or strawberry. Pt denies food allergies or intolerances. Denies nausea, emesis, or abdominal pain. He is edentulous and does not have any dentures but denies any difficulty chewing. Also denies any difficulty swallowing. Pt would like to continue to receive Ensure while admitted. Encouraged adequate intake of calories and protein at meals. Pt would benefit from liberalized  diet to help maximize intake.  Pt reports he has lost weight over time since starting chemotherapy. He reports his UBW was 145 lbs (65.9 kg). Per review of chart wt has fluctuated between 62-64 kg over the past few months. Patient was 158.4 lbs (72 kg) on 03/12/22. Other weights around that time higher, but unsure of accuracy pending fluid status. Since 03/12/22 pt has lost 9 kg or 12.5% weight over the past 5 months, which is significant for time frame.  Medications reviewed and include: ferrous sulfate [id number] mg daily, folic acid 1 mg daily, magnesium oxide 400 mg daily, potassium chloride 40 mEq every 2 hours today x 2 doses, prednisone 40 mg daily, senna-docusate 1 tablet daily, magnesium sulfate 4 grams once IV  Labs reviewed: Potassium 2.9, Magnesium 1.4  UOP: 1500 mL (1 mL/kg/hr)  I/O: +323.2 mL since admission  NUTRITION - FOCUSED PHYSICAL EXAM:  Flowsheet Row Most Recent Value  Orbital Region Severe depletion  Upper Arm Region Severe depletion  Thoracic and Lumbar Region Moderate depletion  Buccal Region Severe depletion  Temple Region Moderate depletion  Clavicle Bone Region Severe depletion  Clavicle and Acromion Bone Region Severe depletion  Scapular Bone Region Severe depletion  Dorsal Hand Severe depletion  Patellar Region Severe depletion  [assessed right leg as s/p left AKA]  Anterior Thigh Region Severe depletion  [assessed right leg as s/p left AKA]  Posterior Calf Region Severe depletion  [assessed right leg as s/p left AKA]  Edema (RD Assessment) None  Hair Reviewed  Eyes Reviewed  Mouth Reviewed  [edentulous]  Skin Reviewed  Nails Reviewed      Diet Order:  Diet Order             Diet Heart Room service appropriate? Yes; Fluid consistency: Thin  Diet effective now                  EDUCATION NEEDS:   Education needs have been addressed (Encouraged adequate intake of calories and protein to prevent further unintentional weight loss.)  Skin:  Skin  Assessment: Reviewed RN Assessment  Last BM:  Unknown  Height:   Ht Readings from Last 1 Encounters:  08/05/22 5' 7"$  (1.702 m)   Weight:   Wt Readings from Last 1 Encounters:  08/05/22 63 kg   BMI:  Body mass index is 21.75 kg/m.  Estimated Nutritional Needs:   Kcal:  1800-2000  Protein:  90-100 grams  Fluid:  1.8-2 L/day  Loanne Drilling, MS, RD, LDN, CNSC Pager number available on Amion

## 2022-08-07 NOTE — Progress Notes (Signed)
CSW received consult for transportation resources. CSW spoke with patient at bedside. CSW offered patient transportation resources. All questions answered. No further questions reported at this time.Patient accepted. SDOH screening complete.

## 2022-08-08 ENCOUNTER — Telehealth: Payer: Self-pay

## 2022-08-08 DIAGNOSIS — E43 Unspecified severe protein-calorie malnutrition: Secondary | ICD-10-CM | POA: Insufficient documentation

## 2022-08-08 DIAGNOSIS — I472 Ventricular tachycardia, unspecified: Secondary | ICD-10-CM | POA: Diagnosis not present

## 2022-08-08 LAB — MAGNESIUM: Magnesium: 2 mg/dL (ref 1.7–2.4)

## 2022-08-08 LAB — BASIC METABOLIC PANEL
Anion gap: 16 — ABNORMAL HIGH (ref 5–15)
BUN: 19 mg/dL (ref 8–23)
CO2: 20 mmol/L — ABNORMAL LOW (ref 22–32)
Calcium: 7.8 mg/dL — ABNORMAL LOW (ref 8.9–10.3)
Chloride: 98 mmol/L (ref 98–111)
Creatinine, Ser: 0.77 mg/dL (ref 0.61–1.24)
GFR, Estimated: >60 mL/min (ref >60)
Glucose, Bld: 317 mg/dL — ABNORMAL HIGH (ref 70–99)
Potassium: 3.6 mmol/L (ref 3.5–5.1)
Sodium: 134 mmol/L — ABNORMAL LOW (ref 135–145)

## 2022-08-08 LAB — CBC
HCT: 33.9 % — ABNORMAL LOW (ref 39.0–52.0)
Hemoglobin: 10.9 g/dL — ABNORMAL LOW (ref 13.0–17.0)
MCH: 28.8 pg (ref 26.0–34.0)
MCHC: 32.2 g/dL (ref 30.0–36.0)
MCV: 89.4 fL (ref 80.0–100.0)
Platelets: 121 10*3/uL — ABNORMAL LOW (ref 150–400)
RBC: 3.79 MIL/uL — ABNORMAL LOW (ref 4.22–5.81)
RDW: 18.5 % — ABNORMAL HIGH (ref 11.5–15.5)
WBC: 5.7 10*3/uL (ref 4.0–10.5)
nRBC: 0 % (ref 0.0–0.2)

## 2022-08-08 LAB — GLUCOSE, CAPILLARY
Glucose-Capillary: 167 mg/dL — ABNORMAL HIGH (ref 70–99)
Glucose-Capillary: 133 mg/dL — ABNORMAL HIGH (ref 70–99)
Glucose-Capillary: 135 mg/dL — ABNORMAL HIGH (ref 70–99)

## 2022-08-08 MED ORDER — POLYETHYLENE GLYCOL 3350 17 G PO PACK
17.0000 g | PACK | Freq: Every day | ORAL | Status: DC
  Filled 2022-08-08: qty 1

## 2022-08-08 MED ORDER — INSULIN ASPART 100 UNIT/ML IJ SOLN
0.0000 [IU] | Freq: Three times a day (TID) | INTRAMUSCULAR | Status: DC
  Administered 2022-08-08: 1 [IU] via SUBCUTANEOUS
  Administered 2022-08-08 – 2022-08-09 (×2): 2 [IU] via SUBCUTANEOUS

## 2022-08-08 MED ORDER — INSULIN ASPART 100 UNIT/ML IJ SOLN: 0.0000 [IU] | Freq: Every day | INTRAMUSCULAR | Status: DC

## 2022-08-08 MED ORDER — AMIODARONE HCL 200 MG PO TABS: 200.0000 mg | ORAL_TABLET | Freq: Two times a day (BID) | ORAL | Status: DC

## 2022-08-08 MED ORDER — AMIODARONE HCL 200 MG PO TABS
400.0000 mg | ORAL_TABLET | Freq: Two times a day (BID) | ORAL | Status: DC
  Administered 2022-08-08 – 2022-08-09 (×3): 400 mg via ORAL
  Filled 2022-08-08 (×3): qty 2

## 2022-08-08 MED ORDER — POTASSIUM CHLORIDE CRYS ER 20 MEQ PO TBCR
40.0000 meq | EXTENDED_RELEASE_TABLET | Freq: Once | ORAL | Status: AC
  Administered 2022-08-08: 40 meq via ORAL
  Filled 2022-08-08: qty 2

## 2022-08-08 MED ORDER — MOMETASONE FURO-FORMOTEROL FUM 100-5 MCG/ACT IN AERO
2.0000 | INHALATION_SPRAY | Freq: Two times a day (BID) | RESPIRATORY_TRACT | Status: DC
  Administered 2022-08-08 – 2022-08-09 (×2): 2 via RESPIRATORY_TRACT
  Filled 2022-08-08: qty 8.8

## 2022-08-08 NOTE — Progress Notes (Addendum)
PT Cancellation Note  Patient Details Name: RHODY MUSLEH MRN: [id number] DOB: Aug 02, 1960   Cancelled Treatment:    Reason Eval/Treat Not Completed: (P) Other (comment), attempted x2 this AM, pt requesting to eat breakfast prior to session on 1st attempt, 2nd attempt pt stating although roommate brought prosthesis, did not bring shoe for other foot and pt unwilling to attempt mobility without. Pt reporting he did leave message for roommate to bring shoe today, will check back in afternoon.   1:07p - checked back, shoe not delivered by roommate, pt continuing to ask to wait until shoe here for mobility despite education.   Betsey Holiday Ruthe Roemer 08/08/2022, 10:31 AM

## 2022-08-08 NOTE — Progress Notes (Signed)
Physical Therapy Treatment Patient Details Name: [name] [name] MRN: [id number] DOB: 1960/08/01 Today's Date: 08/08/2022   History of Present Illness 62 y.o. male adm 2/9 with a pertinent PMH of metastatic adenocarcinoma likely esophageal primary, out of hospital vfib arrest 8/23 2/2 to inferior MI, prior CVA occipital lobe, PAD s/p left AKA 2021, RA, who presented with syncope secondary to ventricular tachycardia.    PT Comments    Pt greeted semi-reclined in bed and agreeable to session with good progress towards acute goals. Pt able to come to sitting EOB with min assist to elevate trunk and scoot out to EOB. Once EOB pt able to don prosthesis with light assist and shoe on R. Pt able to come to stand with min assist from slightly elevated EOB with assist needed to power up and steady on rise as pt with some posterior lean initially, once knee locked into extension on LLE pt able to shift weight anterior and maintain static standing without assist. Pt able to demonstrate short bout of gait in room with min assist grossly, pt with some R lateral lean and R knee flexed but unclear if from weakness or L residual limb not seated far enough into prosthesis or prosthesis too long. Although pt needing increased encouragement for participation pt motivated for improvement. Current plan remains appropriate to address deficits and maximize functional independence and decrease caregiver burden. Pt continues to benefit from skilled PT services to progress toward functional mobility goals.    Recommendations for follow up therapy are one component of a multi-disciplinary discharge planning process, led by the attending physician.  Recommendations may be updated based on patient status, additional functional criteria and insurance authorization.  Follow Up Recommendations  Home health PT     Assistance Recommended at Discharge Intermittent Supervision/Assistance  Patient can return home with the following A  little help with walking and/or transfers;A little help with bathing/dressing/bathroom;Assistance with cooking/housework;Assist for transportation   Equipment Recommendations  Other (comment) (sliding board)    Recommendations for Other Services       Precautions / Restrictions Precautions Precautions: Fall Restrictions Weight Bearing Restrictions: No     Mobility  Bed Mobility Overal bed mobility: Needs Assistance Bed Mobility: Supine to Sit     Supine to sit: Min assist     General bed mobility comments: min assist to elevate torso and scoot out to EOB    Transfers Overall transfer level: Needs assistance Equipment used: Rolling walker (2 wheels) Transfers: Sit to/from Stand Sit to Stand: Min assist           General transfer comment: min assist to power up and lock knee in extension once standing, cues to lower self slowy to chair    Ambulation/Gait Ambulation/Gait assistance: Mod assist, Min assist Gait Distance (Feet): 6 Feet Assistive device: Rolling walker (2 wheels) Gait Pattern/deviations: Step-to pattern, Decreased stride length, Trunk flexed Gait velocity: decr     General Gait Details: short gait in from from EOB to recliner, mod a to start waning to min assist with increased steps, pt with some R lateral lean and R knee flexed but no overt buckling   Stairs             Wheelchair Mobility    Modified Rankin (Stroke Patients Only)       Balance Overall balance assessment: Needs assistance Sitting-balance support: Feet supported, Bilateral upper extremity supported Sitting balance-Leahy Scale: Fair     Standing balance support: Bilateral upper extremity supported Standing balance-Leahy  Scale: Poor Standing balance comment: heavy reliance on BUE support                            Cognition Arousal/Alertness: Awake/alert Behavior During Therapy: WFL for tasks assessed/performed Overall Cognitive Status: Within  Functional Limits for tasks assessed                                 General Comments: pt needing max encouragement but overall willing to participate and wants to get better        Exercises      General Comments General comments (skin integrity, edema, etc.): VSS no RA, pt with mild c/o dizziness once up in chair BP stable      Pertinent Vitals/Pain Pain Assessment Pain Assessment: No/denies pain    Home Living                          Prior Function            PT Goals (current goals can now be found in the care plan section) Acute Rehab PT Goals Patient Stated Goal: Hopes to get home soon PT Goal Formulation: With patient Time For Goal Achievement: 08/20/22 Progress towards PT goals: Progressing toward goals    Frequency    Min 3X/week      PT Plan      Co-evaluation              AM-PAC PT "6 Clicks" Mobility   Outcome Measure  Help needed turning from your back to your side while in a flat bed without using bedrails?: None Help needed moving from lying on your back to sitting on the side of a flat bed without using bedrails?: A Little Help needed moving to and from a bed to a chair (including a wheelchair)?: A Lot Help needed standing up from a chair using your arms (e.g., wheelchair or bedside chair)?: A Little Help needed to walk in hospital room?: A Lot Help needed climbing 3-5 steps with a railing? : Total 6 Click Score: 15    End of Session Equipment Utilized During Treatment: Gait belt Activity Tolerance: Patient tolerated treatment well Patient left: with call bell/phone within reach;in chair Nurse Communication: Mobility status PT Visit Diagnosis: Unsteadiness on feet (R26.81);Other abnormalities of gait and mobility (R26.89);Muscle weakness (generalized) (M62.81);Difficulty in walking, not elsewhere classified (R26.2)     Time: 1435-1500 PT Time Calculation (min) (ACUTE ONLY): 25 min  Charges:  $Gait  Training: 8-22 mins $Therapeutic Activity: 8-22 mins                     Buster Schueller R. PTA Acute Rehabilitation Services Office: [place] 08/08/2022, 3:48 PM

## 2022-08-08 NOTE — Patient Outreach (Signed)
Opened in error

## 2022-08-08 NOTE — Progress Notes (Addendum)
Electrophysiology Rounding Note  Patient Name: [name] [name] Date of Encounter: 08/08/2022  Primary Cardiologist: None Electrophysiologist: Dr. Lovena Le   Subjective   The patient is doing well today.  At this time, the patient denies chest pain, shortness of breath, or any new concerns.  Inpatient Medications    Scheduled Meds:  amiodarone  400 mg Oral BID   apixaban  5 mg Oral BID   atorvastatin  40 mg Oral q1800   Chlorhexidine Gluconate Cloth  6 each Topical Daily   clopidogrel  75 mg Oral Daily   diclofenac Sodium  2 g Topical QID   feeding supplement  237 mL Oral TID BM   ferrous sulfate  325 mg Oral Q breakfast   folic acid  1 mg Oral Daily   isosorbide mononitrate  30 mg Oral Daily   lidocaine  1 patch Transdermal Q24H   magnesium oxide  400 mg Oral Daily   meloxicam  7.5 mg Oral Q breakfast   multivitamin with minerals  1 tablet Oral Daily   potassium chloride  40 mEq Oral Once   predniSONE  40 mg Oral Q breakfast   senna-docusate  1 tablet Oral QHS   Continuous Infusions:  PRN Meds: albuterol, oxyCODONE   Vital Signs    Vitals:   08/07/22 0323 08/07/22 0828 08/07/22 1900 08/08/22 0435  BP: (!) 145/99 (!) 123/93 128/87 (!) 149/100  Pulse: 74 76 77   Resp: 19 20 (!) 22 (!) 21  Temp: 97.9 F (36.6 C) 97.9 F (36.6 C) 97.8 F (36.6 C) 97.8 F (36.6 C)  TempSrc: Oral Oral Oral Oral  SpO2: 97% 95% 94% 96%  Weight:      Height:        Intake/Output Summary (Last 24 hours) at 08/08/2022 0827 Last data filed at 08/08/2022 0400 Gross per 24 hour  Intake 647.81 ml  Output 1250 ml  Net -602.19 ml   Filed Weights   08/04/22 1818 08/05/22 0052  Weight: 62 kg 63 kg    Physical Exam    GEN- The patient is well appearing, alert and oriented x 3 today.   HEENT- No gross abnormality.  Lungs- Clear to ausculation bilaterally, normal work of breathing Heart- Regular rate and rhythm, no murmurs, rubs or gallops GI- soft, NT, ND, + BS Extremities- no  clubbing or cyanosis. No edema Neuro- No obvious focal abnormality.   Telemetry    NSR 70s, occasional PVCs, no further VT (personally reviewed)   Patient Profile     [name] [name] is a 62 y.o. male with a history of CAD, h/o DVT/PE, Metastatic adenocarcinoma, s/p left AKA, Chronic anemia, and h/o VT arrest in setting of STEMI who is being seen today for the evaluation of VF arrest at the request of Dr. Percival Spanish.   Assessment & Plan    1.  Ventricular tachycardia 2. VF arrest In the setting of prolonged QT with hypokalemia and hypomagnesemia  No further OK to transition amiodarone to 400 mg BID x 1 week, then 200 mg BID chronically.  Potassium3.6 (02/13 [id number]) Magnesium  2.0 (02/13 0626) Creatinine, ser  0.77 (02/13 0626) Keep K > 4.0 and Mg > 2.0  With potentially reversible cause in electrolytes, R chest port (infection risk), and unclear prognosis given mixed response to chemo by CT pt is not a candidate for an ICD.   3. CAD LHC 01/2022 with occlusion of proximal non-dominant RCA. Medical management planned   4. Cardiomyopathy Echo 08/05/22  LVEF 45-50%, Grade 1 DD, RV moderately reduced   5. Metastatic adenocarcinoma Per primary team Per Heme/Onc note pt has liver mass vs lymph node conglomerate near the GE junction, gastrohepatic ligament, and retroperitoneal lymphadenopathy CT 07/19/22 Interval decrease of anterior liver lesion, though with at least two new small liver lesions and new RLL nodule "highly suspicious" for a small pulmonary metastasis.    6. H/o DVT/PE Continue DOAC  EP will be available as needed while remains here.  Outpatient follow up made.  For questions or updates, please contact [place]  Please consult [email or web address] for contact info under Cardiology/STEMI.  Signed, Shirley Friar, PA-C  08/08/2022, 8:27 AM   EP Attending  Patient seen and examined. Agree with above. On exam he is well but diskempt appearing NAD with clear lungs and  RRR. Tele with NSR and rare PVC's.  A/P VT - he will transition to oral amio 400 bid for a week then 200 bid.  Chronic systolic heart failure - he will continue his current meds.  Carleene Overlie Azlin Zilberman,MD

## 2022-08-08 NOTE — Progress Notes (Signed)
Interim Note  S: Patient's vitals reported on today's progress note show BP reading of 88/60 recorded at 12:01 PM, which has corrected since without medical intervention.    O:    08/08/2022   12:16 PM 08/08/2022    4:35 AM 08/07/2022    7:00 PM  Vitals with BMI  Systolic 88 [id number]  Diastolic 60 [id number] 87  Pulse 76  77     A/P: #Labile Blood Pressure Now normotensive. Will continue to monitor BP.  - Continue Ami and Imdur   Christene Slates, MD PGY-1 Pager: [phone]

## 2022-08-08 NOTE — Progress Notes (Signed)
NAME:  [name] [name], MRN:  [id number], DOB:  06/07/1961, LOS: 4 ADMISSION DATE:  08/04/2022  Patient evaluated at bedside, reports he is doing well. Endorses adequate appetite and sleep. Pain is improved since increasing his dose of oxycodone, but reports some swelling of his RUE, no appreciable edema/worsening noted on exam. Patient reports some dizziness this morning, currently asymptomatic. Amenable to starting SSI for new onset hyperglycemia (317). Added miralax to bowel regiment as he reports he has not had a BM in the past 3 days. He also endorses some mild shortness of breath, he is amenable to adding dulera inhaler to address this.   Objective   Blood pressure (!) 88/60, pulse 76, temperature (!) 97.4 F (36.3 C), temperature source Oral, resp. rate 20, height 5' 7"$  (1.702 m), weight 63 kg, SpO2 96 %.     Intake/Output Summary (Last 24 hours) at 08/08/2022 1410 Last data filed at 08/08/2022 0400 Gross per 24 hour  Intake 647.81 ml  Output 650 ml  Net -2.19 ml   Filed Weights   08/04/22 1818 08/05/22 0052  Weight: 62 kg 63 kg   Physical Exam: General: Elderly male appearing person resting in bed in no acute distress CV: Regular rate, rhythm. No murmurs/rubs/gallops appreciated. Pulm: Normal work of breathing on room air. Clear to auscultation bilaterally, no wheezing appreciated.  GI: Abdomen soft, non-tender, non-distended. Normoactive bowel sounds. MSK: No peripheral edema. S/P L AKA Neuro: Awake, alert, conversing appropriately. Grossly non-focal.   Labs       Latest Ref Rng & Units 08/08/2022    7:18 AM 08/07/2022   10:45 AM 08/06/2022    4:45 AM  CBC  WBC 4.0 - 10.5 K/uL 5.7  6.5  4.6   Hemoglobin 13.0 - 17.0 g/dL 10.9  10.7  9.9   Hematocrit 39.0 - 52.0 % 33.9  34.3  30.9   Platelets 150 - 400 K/uL 121  112  98       Latest Ref Rng & Units 08/08/2022    6:26 AM 08/07/2022   10:45 AM 08/06/2022    4:45 AM  BMP  Glucose 70 - 99 mg/dL 317  102  134   BUN 8 - 23  mg/dL 19  14  11   $ Creatinine 0.61 - 1.24 mg/dL 0.77  0.71  0.75   Sodium 135 - 145 mmol/L 134  139  138   Potassium 3.5 - 5.1 mmol/L 3.6  2.9  3.7   Chloride 98 - 111 mmol/L 98  104  102   CO2 22 - 32 mmol/L 20  26  25   $ Calcium 8.9 - 10.3 mg/dL 7.8  8.7  9.0     Summary  [name] [name] is a 62 y.o. with pertinent PMH of metastatic adenocarcinoma likely esophageal primary, out of hospital vfib arrest 8/23 2/2 to inferior MI, prior CVA occipital lobe, PAD s/p left AKA 2021, RA who presented with syncope due to ventricular tachycardia requiring cardioversion and admit for ventricular tachycardia.   Assessment & Plan:  Principal Problem:   Ventricular tachycardia ([place])  #Sustained ventricular tachycardia s/p electrical cardioversion by EMS #History of vfib arrest in setting of Inferior MI 8/23 #Coronary artery disease Stable, asymptomatic. K+ corrected 2.9>3.6, s/p Kcl 40 mEq  x2. Mg corrected 1.4>2.0 s/p mag-ox 30 mg. Amio IV d/c'd today, per cards transitioning to Amiodarone 400 mg BID PO for 1 week --> 200 mg BID. - Appreciate Cardiology's care and recommendations - D/c Amiodarone IV -  Start Amiodarone 400 mg BID  for 1 week (2/13-2/20) - Continue Imdur 30 mg daily - Morning BMP and Mg, goal K >4 and Mg >2   #Hyperglycemia Glucose 317 on BMP, likely 2/2 to steroid course.  - Start CBG monitoring - Start sSSI   #Metastatic adenocarcinoma, likely esophageal #Malnutrition Stable. Confirmed appointment with Oncology Feb 19th. Patient's appetite seems adequate today, breakfast tray was empty when I saw him this morning.  - Appreciate RD's care and recommendations  - Start Ensure Enlive TID  - Start multivitamin tablet QD - PT/OT  #Hypertension Normotensive, continue with Imdur per cardiology. Likely does not need home anti-hypertensives. - Continue Imdur 88m daily  #Constipation Likely 2/2 to increasing dose of opoid medications.  -Continue senna-docusate - Start Miralax 17  g  #COPD Emphysema on CT, no recent PFTs. Last PFT 2013: FEV1/FVC 71% -> 81% post bronchodilator. SVC 2.7L, DLCO reduced but corrected for the inhaled alveolar volume. Today complains of intermittent SOB, he attributes as part of his COPD. Did not appear in resp distress, clear lung sounds bilaterally. Was amenable to adding dulera inhaler. -Start Dulera inhaler   #History of DVT/ PE on apixaban #Peripheral arterial disease s/p L AKA 2021 - Continue home Eliquis 5 mg BID - Continue home Plavix 75 mg QD - Continue home Lipitor 40 mg QD   #Rheumatoid arthritis Patient doing well on steroids and oxycodone as needed, continues to endorse pain in R hand. - Start Voltaren gel 4 times daily for pain - Continue prednisone 422m Day 3/5 - Oxycodone 10 mg Q4Hrs PRN - Continue daily meloxicam - Folate supplementation   Best Practice: Diet: Heart healthy diet VTE: Eliquis Code: Full Status: Inpatient with expected length of stay greater than 2 midnights. Anticipated Discharge Location: SNF Barriers to Discharge:  Further evaluation of ventricular tachycardia  MaChristene SlatesMD PGY-1 PAGER: [phone]/13/2024 2:10 PM  If after hours (below), please contact on-call pager: [phone] 0294PM-7AM Monday-Friday 1PM-7AM Saturday-Sunday

## 2022-08-08 NOTE — Progress Notes (Signed)
Rounding Note    Patient Name: [name] [name] Date of Encounter: 08/08/2022  [place] Cardiologist: Dr Percival Spanish  Subjective   No CP or dyspnea  Inpatient Medications    Scheduled Meds:  apixaban  5 mg Oral BID   atorvastatin  40 mg Oral q1800   Chlorhexidine Gluconate Cloth  6 each Topical Daily   clopidogrel  75 mg Oral Daily   diclofenac Sodium  2 g Topical QID   feeding supplement  237 mL Oral TID BM   ferrous sulfate  325 mg Oral Q breakfast   folic acid  1 mg Oral Daily   isosorbide mononitrate  30 mg Oral Daily   lidocaine  1 patch Transdermal Q24H   magnesium oxide  400 mg Oral Daily   meloxicam  7.5 mg Oral Q breakfast   multivitamin with minerals  1 tablet Oral Daily   potassium chloride  40 mEq Oral Once   predniSONE  40 mg Oral Q breakfast   senna-docusate  1 tablet Oral QHS   Continuous Infusions:  amiodarone 30 mg/hr (08/07/22 2142)   PRN Meds: albuterol, oxyCODONE   Vital Signs    Vitals:   08/07/22 0323 08/07/22 0828 08/07/22 1900 08/08/22 0435  BP: (!) 145/99 (!) 123/93 128/87 (!) 149/100  Pulse: 74 76 77   Resp: 19 20 (!) 22 (!) 21  Temp: 97.9 F (36.6 C) 97.9 F (36.6 C) 97.8 F (36.6 C) 97.8 F (36.6 C)  TempSrc: Oral Oral Oral Oral  SpO2: 97% 95% 94% 96%  Weight:      Height:        Intake/Output Summary (Last 24 hours) at 08/08/2022 0801 Last data filed at 08/08/2022 0400 Gross per 24 hour  Intake 647.81 ml  Output 1250 ml  Net -602.19 ml      08/05/2022   12:52 AM 08/04/2022    6:18 PM 07/17/2022    9:52 AM  Last 3 Weights  Weight (lbs) 138 lb 14.2 oz 136 lb 11 oz 137 lb 4.8 oz  Weight (kg) 63 kg 62 kg 62.279 kg      Telemetry    Sinus with PVCs - Personally Reviewed   Physical Exam   GEN: No acute distress.   Neck: No JVD Cardiac: RRR, no murmurs, rubs, or gallops.  Respiratory: Clear to auscultation bilaterally.  Port in place. GI: Soft, nontender, non-distended  MS: No edema; status post left  AKA Neuro:  Nonfocal  Psych: Normal affect   Labs    High Sensitivity Troponin:   Recent Labs  Lab 08/04/22 1824 08/05/22 0055 08/05/22 0552 08/05/22 0751  TROPONINIHS 14 108* 83* 74*     Chemistry Recent Labs  Lab 08/04/22 1824 08/04/22 1828 08/05/22 0055 08/05/22 0443 08/06/22 0445 08/07/22 1045 08/08/22 0626  NA 140  --    < > 136 138 139 134*  K 2.6*  --    < > 3.6 3.7 2.9* 3.6  CL 104  --    < > 103 102 104 98  CO2 18*  --    < > 23 25 26 $ 20*  GLUCOSE 161*  --    < > 102* 134* 102* 317*  BUN 11  --    < > 10 11 14 19  $ CREATININE 1.01  --    < > 0.64 0.75 0.71 0.77  CALCIUM 8.8*  --    < > 8.0* 9.0 8.7* 7.8*  MG  --  1.7  --   --   --  1.4* 2.0  PROT 7.2  --   --  5.8*  --   --   --   ALBUMIN 3.1*  --   --  2.5*  --   --   --   AST 73*  --   --  38  --   --   --   ALT 41  --   --  32  --   --   --   ALKPHOS 285*  --   --  207*  --   --   --   BILITOT 0.9  --   --  0.4  --   --   --   GFRNONAA >60  --    < > >60 >60 >60 >60  ANIONGAP 18*  --    < > 10 11 9 $ 16*   < > = values in this interval not displayed.     Hematology Recent Labs  Lab 08/06/22 0445 08/07/22 1045 08/08/22 0718  WBC 4.6 6.5 5.7  RBC 3.45* 3.81* 3.79*  HGB 9.9* 10.7* 10.9*  HCT 30.9* 34.3* 33.9*  MCV 89.6 90.0 89.4  MCH 28.7 28.1 28.8  MCHC 32.0 31.2 32.2  RDW 18.5* 18.6* 18.5*  PLT 98* 112* 121*   Thyroid  Recent Labs  Lab 08/04/22 1828  TSH 2.959     Patient Profile     62 y.o. male with past medical history of coronary artery disease, prior PE/DVT, metastatic esophageal adenocarcinoma, prior left AKA admitted with ventricular tachycardia.  Cardiac catheterization August 2023 showed occluded right coronary artery but no other obstructive coronary disease.  Medical therapy recommended.  Echocardiogram this admission shows ejection fraction 45 to 50%, mild left ventricular hypertrophy, grade 1 diastolic dysfunction, moderate right ventricular enlargement, moderate right atrial  enlargement.  Assessment & Plan    1 sustained monomorphic ventricular tachycardia with hemodynamic instability-this occurred in the setting of severe hypokalemia/hypomagnesemia.  This has been supplemented and he has had no further VT.  Will change IV amiodarone to 400 mg twice daily today for 1 week then 400 mg daily thereafter.  Given his comorbidities including metastatic esophageal cancer he is felt not to be candidate for ICD.  See note from Dr. Lovena Le yesterday.  2 coronary artery disease-continue Plavix and statin.  Had recent cardiac catheterization August 2023 and medical therapy recommended.  3 history of DVT/pulmonary embolus-will continue apixaban.  4 hypokalemia/hypomagnesemia-improved following supplementation.  Will need close follow-up as an outpatient.  5 metastatic esophageal adenocarcinoma-managed by oncology.  Patient can likely be discharged tomorrow if stable on telemetry.  For questions or updates, please contact [place] Please consult [email or web address] for contact info under        Signed, Kirk Ruths, MD  08/08/2022, 8:01 AM

## 2022-08-08 NOTE — Care Management Important Message (Signed)
Important Message  Patient Details  Name: [name] [name] MRN: [id number] Date of Birth: 22-Oct-1960   Medicare Important Message Given:  Yes     [name] 08/08/2022, 11:18 AM

## 2022-08-09 ENCOUNTER — Other Ambulatory Visit: Payer: Self-pay

## 2022-08-09 ENCOUNTER — Other Ambulatory Visit (HOSPITAL_COMMUNITY): Payer: Self-pay

## 2022-08-09 ENCOUNTER — Encounter: Payer: Self-pay | Admitting: Oncology

## 2022-08-09 ENCOUNTER — Other Ambulatory Visit: Payer: Self-pay | Admitting: *Deleted

## 2022-08-09 DIAGNOSIS — I472 Ventricular tachycardia, unspecified: Secondary | ICD-10-CM | POA: Diagnosis not present

## 2022-08-09 DIAGNOSIS — C159 Malignant neoplasm of esophagus, unspecified: Secondary | ICD-10-CM

## 2022-08-09 LAB — BASIC METABOLIC PANEL
Anion gap: 9 (ref 5–15)
BUN: 28 mg/dL — ABNORMAL HIGH (ref 8–23)
CO2: 24 mmol/L (ref 22–32)
Calcium: 8.5 mg/dL — ABNORMAL LOW (ref 8.9–10.3)
Chloride: 102 mmol/L (ref 98–111)
Creatinine, Ser: 0.82 mg/dL (ref 0.61–1.24)
GFR, Estimated: >60 mL/min (ref >60)
Glucose, Bld: 121 mg/dL — ABNORMAL HIGH (ref 70–99)
Potassium: 4.1 mmol/L (ref 3.5–5.1)
Sodium: 135 mmol/L (ref 135–145)

## 2022-08-09 LAB — GLUCOSE, CAPILLARY
Glucose-Capillary: 79 mg/dL (ref 70–99)
Glucose-Capillary: 160 mg/dL — ABNORMAL HIGH (ref 70–99)

## 2022-08-09 LAB — MAGNESIUM: Magnesium: 2 mg/dL (ref 1.7–2.4)

## 2022-08-09 MED ORDER — ALBUTEROL SULFATE HFA 108 (90 BASE) MCG/ACT IN AERS
2.0000 | INHALATION_SPRAY | Freq: Four times a day (QID) | RESPIRATORY_TRACT | 2 refills | Status: DC | PRN
  Filled 2022-08-09: qty 6.7, 17d supply, fill #0

## 2022-08-09 MED ORDER — MELOXICAM 7.5 MG PO TABS
7.5000 mg | ORAL_TABLET | Freq: Every day | ORAL | 0 refills | Status: DC | PRN
  Filled 2022-08-09: qty 15, 15d supply, fill #0

## 2022-08-09 MED ORDER — ATORVASTATIN CALCIUM 40 MG PO TABS
40.0000 mg | ORAL_TABLET | Freq: Every day | ORAL | 0 refills | Status: DC
  Filled 2022-08-09: qty 30, 30d supply, fill #0

## 2022-08-09 MED ORDER — APIXABAN 5 MG PO TABS
5.0000 mg | ORAL_TABLET | Freq: Two times a day (BID) | ORAL | 0 refills | Status: DC
  Filled 2022-08-09: qty 60, 30d supply, fill #0

## 2022-08-09 MED ORDER — ISOSORBIDE MONONITRATE ER 30 MG PO TB24
30.0000 mg | ORAL_TABLET | Freq: Every day | ORAL | 0 refills | Status: DC
  Filled 2022-08-09: qty 30, 30d supply, fill #0

## 2022-08-09 MED ORDER — AMIODARONE HCL 200 MG PO TABS
400.0000 mg | ORAL_TABLET | Freq: Two times a day (BID) | ORAL | 0 refills | Status: DC
  Filled 2022-08-09: qty 24, 6d supply, fill #0

## 2022-08-09 MED ORDER — MOMETASONE FURO-FORMOTEROL FUM 100-5 MCG/ACT IN AERO
2.0000 | INHALATION_SPRAY | Freq: Two times a day (BID) | RESPIRATORY_TRACT | 0 refills | Status: DC
  Filled 2022-08-09: qty 13, 30d supply, fill #0

## 2022-08-09 MED ORDER — DICLOFENAC SODIUM 1 % EX GEL
2.0000 g | Freq: Four times a day (QID) | CUTANEOUS | 0 refills | Status: AC
  Filled 2022-08-09: qty 100, 13d supply, fill #0

## 2022-08-09 MED ORDER — ENSURE ENLIVE PO LIQD
237.0000 mL | Freq: Three times a day (TID) | ORAL | 12 refills | Status: AC
  Filled 2022-08-09: qty 237, 1d supply, fill #0

## 2022-08-09 MED ORDER — CLOPIDOGREL BISULFATE 75 MG PO TABS
75.0000 mg | ORAL_TABLET | Freq: Every day | ORAL | 0 refills | Status: AC
  Filled 2022-08-09: qty 30, 30d supply, fill #0

## 2022-08-09 MED ORDER — AMIODARONE HCL 200 MG PO TABS: 200.0000 mg | ORAL_TABLET | Freq: Every day | ORAL | Status: DC

## 2022-08-09 MED ORDER — AMIODARONE HCL 200 MG PO TABS
200.0000 mg | ORAL_TABLET | Freq: Two times a day (BID) | ORAL | 0 refills | Status: DC
  Filled 2022-08-09: qty 84, 36d supply, fill #0

## 2022-08-09 MED ORDER — FERROUS SULFATE 325 (65 FE) MG PO TABS
325.0000 mg | ORAL_TABLET | Freq: Every day | ORAL | 0 refills | Status: DC
  Filled 2022-08-09: qty 100, 100d supply, fill #0

## 2022-08-09 MED ORDER — AMLODIPINE BESYLATE 10 MG PO TABS
10.0000 mg | ORAL_TABLET | Freq: Every day | ORAL | 0 refills | Status: DC
  Filled 2022-08-09: qty 30, 30d supply, fill #0

## 2022-08-09 MED ORDER — MAGNESIUM OXIDE 400 MG PO TABS
400.0000 mg | ORAL_TABLET | Freq: Every day | ORAL | 0 refills | Status: AC
  Filled 2022-08-09: qty 30, 30d supply, fill #0

## 2022-08-09 MED ORDER — OXYCODONE HCL 10 MG PO TABS
5.0000 mg | ORAL_TABLET | Freq: Four times a day (QID) | ORAL | 0 refills | Status: DC | PRN
  Filled 2022-08-09: qty 28, 14d supply, fill #0

## 2022-08-09 MED ORDER — ONDANSETRON HCL 8 MG PO TABS
ORAL_TABLET | ORAL | 0 refills | Status: DC
  Filled 2022-08-09: qty 20, 7d supply, fill #0

## 2022-08-09 NOTE — Progress Notes (Signed)
Occupational Therapy Treatment Patient Details Name: [name] [name] MRN: [id number] DOB: [birth year] Today's Date: 08/09/2022   History of present illness 62 y.o. male adm 2/9 with a pertinent PMH of metastatic adenocarcinoma likely esophageal primary, out of hospital vfib arrest 8/23 2/2 to inferior MI, prior CVA occipital lobe, PAD s/p left AKA 2021, RA, who presented with syncope secondary to ventricular tachycardia.   OT comments  Pt has met initial first two adl goals. Two new goals have been set. Pt is progressing well with adls transfers and adls. Pt continues to need some assist with adls. Pt's two roommates were present and assisted with session and are very hands on. Feel when pt is cleared for d/c they will be able to provide the help pt needs. Cont to recommend HHOT to get back back to baseline with all adls.   Recommendations for follow up therapy are one component of a multi-disciplinary discharge planning process, led by the attending physician.  Recommendations may be updated based on patient status, additional functional criteria and insurance authorization.    Follow Up Recommendations  Home health OT     Assistance Recommended at Discharge Intermittent Supervision/Assistance  Patient can return home with the following  Assist for transportation;Assistance with cooking/housework;A little help with bathing/dressing/bathroom;A lot of help with walking and/or transfers   Equipment Recommendations  Tub/shower bench;Other (comment) (Pt has chair at home.  Bench may be easier. Will leave to [facility] to eval.)    Recommendations for Other Services      Precautions / Restrictions Precautions Precautions: Fall Restrictions Weight Bearing Restrictions: No       Mobility Bed Mobility Overal bed mobility: Needs Assistance Bed Mobility: Supine to Sit     Supine to sit: Min guard     General bed mobility comments: Pt with increased ability to scoot to EOB today only requring  min guard for balance.    Transfers Overall transfer level: Needs assistance Equipment used: 1 person hand held assist Transfers: Sit to/from Stand Sit to Stand: Min assist          Lateral/Scoot Transfers: Min guard General transfer comment: Pt states sometimes her transfers with prosthetic and sometimes without.  Roommates assist when he is not using prosthetic. Roomates present for session and feel they an easily handle pt at home at current level.  Both seem very hands on and not afraid to assist.     Balance Overall balance assessment: Needs assistance Sitting-balance support: Feet supported, Bilateral upper extremity supported Sitting balance-Leahy Scale: Fair     Standing balance support: Bilateral upper extremity supported Standing balance-Leahy Scale: Poor Standing balance comment: heavy reliance on BUE support                           ADL either performed or assessed with clinical judgement   ADL Overall ADL's : Needs assistance/impaired Eating/Feeding: Set up;Sitting   Grooming: Wash/dry hands;Wash/dry face;Brushing hair;Set up;Sitting                   Toilet Transfer: Min guard;Squat-pivot;BSC/3in1   Toileting- Clothing Manipulation and Hygiene: Supervision/safety;Sitting/lateral lean       Functional mobility during ADLs: Minimal assistance General ADL Comments: Pt has been sponge bathing at home. Pt can requires someone with him to stand and bathe peri area but otherwise is encouraged to bathe self in recliner with nsg. tech. Spoke to nurse tech about allowing pt to do for himeself.  Extremity/Trunk Assessment Upper Extremity Assessment RUE Deficits / Details: severe RA to hand RUE Sensation: decreased light touch RUE Coordination: decreased fine motor LUE Deficits / Details: severe RA to hand LUE Sensation: decreased light touch LUE Coordination: decreased fine motor   Lower Extremity Assessment Lower Extremity Assessment:  Defer to PT evaluation        Vision       Perception     Praxis      Cognition Arousal/Alertness: Awake/alert Behavior During Therapy: WFL for tasks assessed/performed Overall Cognitive Status: Within Functional Limits for tasks assessed                                 General Comments: Pt resists all activity at first but with encouragement, pt does more.        Exercises      Shoulder Instructions       General Comments Pt slightly more mobile today with basic adls and adl transers. Pt not willing to put prosthetic on today but did transfer to [facility] and recliner. Talked to pt and roommates about equipment that could help pt and about home health services.    Pertinent Vitals/ Pain       Pain Assessment Pain Assessment: 0-10 Pain Score: 2  Pain Location: fingers from RA Pain Descriptors / Indicators: Aching Pain Intervention(s): Monitored during session, Repositioned  Home Living                                          Prior Functioning/Environment              Frequency  Min 2X/week        Progress Toward Goals  OT Goals(current goals can now be found in the care plan section)  Progress towards OT goals: Goals met and updated - see care plan  Acute Rehab OT Goals Patient Stated Goal: to go home today OT Goal Formulation: With patient Time For Goal Achievement: 08/18/22 Potential to Achieve Goals: Good ADL Goals Pt Will Perform Grooming: with set-up;sitting Pt Will Transfer to Toilet: bedside commode;with min guard assist;squat pivot transfer  Plan Discharge plan remains appropriate    Co-evaluation                 AM-PAC OT "6 Clicks" Daily Activity     Outcome Measure   Help from another person eating meals?: None Help from another person taking care of personal grooming?: None Help from another person toileting, which includes using toliet, bedpan, or urinal?: A Little Help from another person  bathing (including washing, rinsing, drying)?: A Little Help from another person to put on and taking off regular upper body clothing?: None Help from another person to put on and taking off regular lower body clothing?: A Lot 6 Click Score: 20    End of Session Equipment Utilized During Treatment: Rolling walker (2 wheels)  OT Visit Diagnosis: Unsteadiness on feet (R26.81);Muscle weakness (generalized) (M62.81)   Activity Tolerance Patient tolerated treatment well   Patient Left in chair;with call bell/phone within reach;with family/visitor present   Nurse Communication Mobility status        Time: 6269-4854 OT Time Calculation (min): 21 min  Charges: OT General Charges $OT Visit: 1 Visit OT Treatments $Self Care/Home Management : 8-22 mins   Glenford Peers 08/09/2022, 10:18  AM

## 2022-08-09 NOTE — Plan of Care (Signed)

## 2022-08-09 NOTE — Plan of Care (Signed)
  Problem: Education: Goal: Knowledge of General Education information will improve Description: Including pain rating scale, medication(s)/side effects and non-pharmacologic comfort measures Outcome: Progressing   Problem: Health Behavior/Discharge Planning: Goal: Ability to manage health-related needs will improve Outcome: Progressing   Problem: Clinical Measurements: Goal: Cardiovascular complication will be avoided Outcome: Progressing   Problem: Activity: Goal: Risk for activity intolerance will decrease Outcome: Progressing   Problem: Pain Managment: Goal: General experience of comfort will improve Outcome: Progressing   Problem: Safety: Goal: Ability to remain free from injury will improve Outcome: Progressing   Problem: Skin Integrity: Goal: Risk for impaired skin integrity will decrease Outcome: Progressing

## 2022-08-09 NOTE — Progress Notes (Signed)
Rounding Note    Patient Name: [name] [name] Date of Encounter: 08/09/2022  [facility] Cardiologist: Dr Percival Spanish  Subjective   Pt denies CP or dyspnea  Inpatient Medications    Scheduled Meds:  [START ON 08/16/2022] amiodarone  200 mg Oral BID   amiodarone  400 mg Oral BID   apixaban  5 mg Oral BID   atorvastatin  40 mg Oral q1800   Chlorhexidine Gluconate Cloth  6 each Topical Daily   clopidogrel  75 mg Oral Daily   diclofenac Sodium  2 g Topical QID   feeding supplement  237 mL Oral TID BM   ferrous sulfate  325 mg Oral Q breakfast   folic acid  1 mg Oral Daily   insulin aspart  0-5 Units Subcutaneous QHS   insulin aspart  0-9 Units Subcutaneous TID WC   isosorbide mononitrate  30 mg Oral Daily   lidocaine  1 patch Transdermal Q24H   magnesium oxide  400 mg Oral Daily   meloxicam  7.5 mg Oral Q breakfast   mometasone-formoterol  2 puff Inhalation BID   multivitamin with minerals  1 tablet Oral Daily   polyethylene glycol  17 g Oral QHS   predniSONE  40 mg Oral Q breakfast   senna-docusate  1 tablet Oral QHS   Continuous Infusions:   PRN Meds: albuterol, oxyCODONE   Vital Signs    Vitals:   08/08/22 1216 08/08/22 2018 08/08/22 2046 08/09/22 0337  BP: (!) 88/60 118/85  (!) 139/92  Pulse: 76 70  67  Resp: 20 19  19  $ Temp: (!) 97.4 F (36.3 C) 97.6 F (36.4 C)  97.7 F (36.5 C)  TempSrc: Oral Oral  Oral  SpO2:  97% 100% 97%  Weight:      Height:        Intake/Output Summary (Last 24 hours) at 08/09/2022 0801 Last data filed at 08/09/2022 0100 Gross per 24 hour  Intake 240 ml  Output 400 ml  Net -160 ml       08/05/2022   12:52 AM 08/04/2022    6:18 PM 07/17/2022    9:52 AM  Last 3 Weights  Weight (lbs) 138 lb 14.2 oz 136 lb 11 oz 137 lb 4.8 oz  Weight (kg) 63 kg 62 kg 62.279 kg      Telemetry    Sinus- Personally Reviewed   Physical Exam   GEN: NAD Neck: Supple Cardiac: RRR Respiratory: CTA.  Port in place. GI: Soft, NT  ND MS: No edema; status post left AKA Neuro: Grossly intact Psych: Normal affect   Labs    High Sensitivity Troponin:   Recent Labs  Lab 08/04/22 1824 08/05/22 0055 08/05/22 0552 08/05/22 0751  TROPONINIHS 14 108* 83* 74*      Chemistry Recent Labs  Lab 08/04/22 1824 08/04/22 1828 08/05/22 0443 08/06/22 0445 08/07/22 1045 08/08/22 0626 08/09/22 0216  NA 140   < > 136   < > 139 134* 135  K 2.6*   < > 3.6   < > 2.9* 3.6 4.1  CL 104   < > 103   < > 104 98 102  CO2 18*   < > 23   < > 26 20* 24  GLUCOSE 161*   < > 102*   < > 102* 317* 121*  BUN 11   < > 10   < > 14 19 28*  CREATININE 1.01   < > 0.64   < >  0.71 0.77 0.82  CALCIUM 8.8*   < > 8.0*   < > 8.7* 7.8* 8.5*  MG  --    < >  --   --  1.4* 2.0 2.0  PROT 7.2  --  5.8*  --   --   --   --   ALBUMIN 3.1*  --  2.5*  --   --   --   --   AST 73*  --  38  --   --   --   --   ALT 41  --  32  --   --   --   --   ALKPHOS 285*  --  207*  --   --   --   --   BILITOT 0.9  --  0.4  --   --   --   --   GFRNONAA >60   < > >60   < > >60 >60 >60  ANIONGAP 18*   < > 10   < > 9 16* 9   < > = values in this interval not displayed.      Hematology Recent Labs  Lab 08/06/22 0445 08/07/22 1045 08/08/22 0718  WBC 4.6 6.5 5.7  RBC 3.45* 3.81* 3.79*  HGB 9.9* 10.7* 10.9*  HCT 30.9* 34.3* 33.9*  MCV 89.6 90.0 89.4  MCH 28.7 28.1 28.8  MCHC 32.0 31.2 32.2  RDW 18.5* 18.6* 18.5*  PLT 98* 112* 121*    Thyroid  Recent Labs  Lab 08/04/22 1828  TSH 2.959      Patient Profile     62 y.o. male with past medical history of coronary artery disease, prior PE/DVT, metastatic esophageal adenocarcinoma, prior left AKA admitted with ventricular tachycardia.  Cardiac catheterization August 2023 showed occluded right coronary artery but no other obstructive coronary disease.  Medical therapy recommended.  Echocardiogram this admission shows ejection fraction 45 to 50%, mild left ventricular hypertrophy, grade 1 diastolic dysfunction,  moderate right ventricular enlargement, moderate right atrial enlargement.  Assessment & Plan    1 sustained monomorphic ventricular tachycardia with hemodynamic instability-as outlined previously this occurred in the setting of severe electrolyte abnormalities (hypomagnesemia and hypokalemia).  These have been supplemented and he has had no further ventricular tachycardia.  Will continue amiodarone (treat with 400 mg twice daily for 1 week at discharge and 200 mg daily thereafter). Given his comorbidities including metastatic esophageal cancer he is felt not to be candidate for ICD.    2 coronary artery disease-patient has not had chest pain.  Continue Plavix and statin.  Had recent cardiac catheterization August 2023 and medical therapy recommended.  3 history of DVT/pulmonary embolus-will continue apixaban.  4 hypokalemia/hypomagnesemia-improved following supplementation.  Will plan to repeat magnesium and potassium 1 week following discharge.  5 metastatic esophageal adenocarcinoma-managed by oncology.  Patient can be discharged from a cardiac standpoint.  We will arrange follow-up with APP in 2 to 4 weeks.  Check magnesium, potassium and renal function in 1 week.  Cardiology will sign off.  Please call with questions.  For questions or updates, please contact [place] Please consult [email or web address] for contact info under        Signed, Kirk Ruths, MD  08/09/2022, 8:01 AM

## 2022-08-09 NOTE — Discharge Summary (Signed)
Name: [name] [name] MRN: [id number] DOB: 10-16-1960 62 y.o. PCP: Angelique Blonder, DO  Date of Admission: 08/04/2022  6:10 PM Date of Discharge: 08/09/22  Attending Physician: Angelica Pou, MD  Discharge Diagnosis: 1. Principal Problem:   Ventricular tachycardia (HCC) Active Problems:   Protein-calorie malnutrition, severe   Discharge Medications: Allergies as of 08/09/2022       Reactions   Dilaudid [hydromorphone Hcl] Rash   Ramipril Rash, Other (See Comments)   Per patient on R arm and leg only; no angioedema   Tramadol Itching, Rash        Medication List     STOP taking these medications    chlorthalidone 25 MG tablet Commonly known as: HYGROTON   methotrexate 7.5 MG tablet Commonly known as: RHEUMATREX   metoprolol succinate 25 MG 24 hr tablet Commonly known as: TOPROL-XL   oxyCODONE-acetaminophen 5-325 MG tablet Commonly known as: PERCOCET/ROXICET       TAKE these medications    albuterol 108 (90 Base) MCG/ACT inhaler Commonly known as: VENTOLIN HFA Inhale 2 puffs into the lungs every 6 (six) hours as needed for wheezing or shortness of breath.   amiodarone 200 MG tablet Commonly known as: PACERONE Take 2 tablets (400 mg total) by mouth 2 (two) times daily for 6 days.   amiodarone 200 MG tablet Commonly known as: Pacerone Take 2 tablets (400 mg total) by mouth 2 (two) times daily for 6 days. Then, (starting on 2/21) take 1 tablet (200 mg total) by mouth 2 (two) times daily. Start taking on: August 16, 2022   amLODipine 10 MG tablet Commonly known as: NORVASC Take 1 tablet (10 mg total) by mouth daily.   apixaban 5 MG Tabs tablet Commonly known as: ELIQUIS Take 1 tablet (5 mg total) by mouth 2 (two) times daily.   atorvastatin 40 MG tablet Commonly known as: LIPITOR Take 1 tablet (40 mg total) by mouth daily at 6 PM.   clopidogrel 75 MG tablet Commonly known as: Plavix Take 1 tablet (75 mg total) by mouth daily.   diclofenac  Sodium 1 % Gel Commonly known as: VOLTAREN Apply 2 g topically 4 (four) times daily.   DULoxetine 30 MG capsule Commonly known as: CYMBALTA Take 30 mg by mouth daily as needed (sleep).   feeding supplement Liqd Take 237 mLs by mouth 3 (three) times daily between meals.   ferrous sulfate 325 (65 FE) MG tablet Take 1 tablet (325 mg total) by mouth daily with breakfast for 100 doses.   folic acid 1 MG tablet Commonly known as: FOLVITE Take 1 tablet (1 mg total) by mouth daily.   gabapentin 100 MG capsule Commonly known as: NEURONTIN Take 1 capsule (100 mg total) by mouth See admin instructions. Daily or twice daily if tolerated. What changed:  how much to take when to take this additional instructions   isosorbide mononitrate 30 MG 24 hr tablet Commonly known as: IMDUR Take 1 tablet (30 mg total) by mouth daily.   lidocaine-prilocaine cream Commonly known as: EMLA Apply 1 Application topically as directed. Apply to port site 2 hours prior to stick and cover with Press-and-Seal to numb port before access   magnesium oxide 400 MG tablet Commonly known as: MAG-OX Take 1 tablet (400 mg total) by mouth daily.   meloxicam 7.5 MG tablet Commonly known as: MOBIC Take 1 tablet (7.5 mg total) by mouth daily as needed for pain.   mometasone-formoterol 100-5 MCG/ACT Aero Commonly known as: Surveyor, mining  2 puffs into the lungs 2 (two) times daily.   ondansetron 8 MG tablet Commonly known as: ZOFRAN TAKE 1 TABLET(8 MG) BY MOUTH EVERY 8 HOURS AS NEEDED FOR NAUSEA OR VOMITING   Oxycodone HCl 10 MG Tabs Take 0.5 tablets (5 mg total) by mouth every 6 (six) hours as needed for up to 14 days for severe pain (Take 5 mg of oxycodone for moderate pain, every 6 hours as needed.You can take up to 10 mg of oxycodone up to 4 times a day for severe pain.).   potassium chloride 10 MEQ tablet Commonly known as: KLOR-CON M TAKE 2 TABLETS(20 MEQ) BY MOUTH DAILY               Durable  Medical Equipment  (From admission, onward)           Start     Ordered   08/09/22 1018  For home use only DME Other see comment  Once       Comments: Sliding board.  Question:  Length of Need  Answer:  Lifetime   08/09/22 1018            Disposition and follow-up:  [name] [name] was discharged from [facility] in Stable condition.  At the hospital follow up visit please address:  #Sustained ventricular tachycardia s/p electrical cardioversion by EMS  # Hypokalemia, resolved #Hypomagnesemia, resolved -Follow up repeat BMP and Mg levels -Repeat EKG -Ensure patient has follow-up with cardiology on 09/14/2022 at 10:30 AM for further recommendations and changes. -Continue amiodarone 400 mg twice daily p.o. for 1 week, ending on 08/15/2022. -Start amiodarone 200 mg twice daily p.o. starting 08/16/2022  #Rheumatoid Arthritis, chronic stable -Ensure safe tapering of opioids for chronic RA pain, was provided 14-day course of oxycodone 10 mg at discharge.  -Reassess use of meloxicam 7.5 mg daily in the setting of daily Eliquis and Plavix. - Ensure he is taking methotrexate 7.5 mg daily  #Metastatic adenocarcinoma, likely esophageal #Malnutrition -Ensure patient has follow-up with oncology on 09/12/2022 for further recommendations and chemotherapy -Ensure patient meals are supplemented with Ensure to meet caloric needs in setting of unintended weight loss secondary to malignancy. -Consider palliative referral to address advance directive and goals of care discussion.  #Constipation, Resolved Likely secondary to increasing dose of opioid medications to optimize pain control during hospitalization.  Was previously on Percocets 5 mg at home.  Patient received oxycodone 5 mg every 6 hours as needed for pain on admission, increased to 10 mg every 6 hours as needed for pain. -Reported daily bowel movements with combination of MiraLAX and Senokot. -Reassess for possible  chronic constipation secondary to opioid use  #COPD, chronic stable -Requiring addition of Dulera inhaler to improve shortness of breath, otherwise satting well on room air during hospitalization. -Follow-up PFTs  Follow-up Appointments:  Follow-up Information     Shirley Friar, PA-C Follow up.   Specialty: Cardiology Why: Hospital follow-up with EP scheduled for 08/29/2022 at 10:20am at our [facility] office. Please arrive 15 minutes early for check-in. If this date/time does not work for you, please call our office to reschedule. Contact information: [address] [place] [address] [phone]         Darreld Mclean, PA-C Follow up.   Specialty: Cardiology Why: Hospital follow-up with General Cardiology scheduled for 09/14/2022 at 10:30am at our [facility] office. Please arrive 15 minutes early for check-in. If this date/time does not work for you, please  call our office to reschedule. Contact information: [address] [place] [address] [place], [place] Follow up.   Specialty: Home Health Services Why: Physical and Occupational Therapy-office to call with visit times. Contact information: [place] [address] [phone]                 Hospital Course by problem list:   Sustained ventricular tachycardia s/p electrical cardioversion by EMS, resolved History of vfib arrest in setting of Inferior MI 8/23 CAD, chronic stable Elevated Troponins, resolved Patient present with shortness of breath and likely syncope in setting of sustained ventricular tachycardia. He denies chest pain throughout episode and currently. EKG on arrival showed sinus tachycardia with PVC. Initial troponin at 14. Last echo 11/23 showed EF 50-55% with low normal function with no regional wall motion abnormalities with moderately enlarged right ventricle. Left heart cath showed 30%  stenosis of circumflex and 100% in RCA which was medically managed. Ventricular tachycardia likely multifactorial with known cardiac disease complicated by electrolyte derangements in setting of n/v with chemo and chlorthalidone. Cardiology evaluated patient and amiodarone was started. TSH within normal limits at 2.9.  Per cardiology's recommendations, Amiodarone gtt was started along with continuing home Imdur 30 mg.  Electrolytes were repleted as needed, described below.  Patient transitioned to PO Amiodarone 400 mg BID, with plan to continue this dose for 1 week and transition to 200 mg twice daily.    Hypokalemia, resolved Hypomagnesemia, resolved Prolonged Qtc, resolved Prior to admission, patient had been experiencing poor p.o. intake secondary to nausea and vomiting in the setting of chemo infusion 2 weeks prior.  Patient had also ran out of Compazine.  Initially presented with a K+ 2.6 and Mg 1.7, both correcting with supplementation.  Patient became hypokalemic and hypomagnesemic again on fourth day of hospitalization, again correcting with supplementation.  No tachy-arrhythmias appreciated on cardiac monitoring during the second episode.  Remained stable on day of discharge. Qtc 484 on admission, improved to 457.   Metastatic adenocarcinoma, likely esophagus, stable Malnutrition, stable He was found to have liver mass 8/23 on CT after cardiac arrest and biopsy consistent with adenocarcinoma, PET showed esophageal mass. He was not able to undergo EGD in setting of cardiac arrest.  He started chemotherapy 10/23 and is on cycle 7 last dose 1/22.  Home pain medication is oxycodone-acetaminophen space 5-325 every 6 hour as needed and gabapentin 100 mg twice daily.  The gabapentin was held, as patient said this did not address his pain.  Per patient he does not have an advanced directive, was educated on the importance of preparing this type of documentation should his health decline in his  decision-making capacity be compromised.  Based on daily discussions with patient, does not appear to have throat understanding of his prognosis, believes his cancer is being cared and is not concerned about his decline in functional status.   AGMA, resolved Bicarb initially at 28 with AG of 18. Differentials include starvation ketosis in setting of not eating much in several days from nausea and vomiting versus lactic acidosis with cardioversion.  Repeat labs improved to Bicarb 25 with AG 10.  Resolved during hospitalization, likely improving with adequate p.o. intake.   AKI, resolved Creatinine elevated at 1, baseline creatinine around 0.7. Likely pre-renal in the setting of poor oral intake, resolving during hospitalization.   HTN Home medications include Imdur 30 mg,  amlodipine 10 mg, metoprolol 25 mg, and chlorthalidone.  From previous office visit in 12/2, chlorthalidone was discontinued.  Hypertensive medications were held as patient was largely normotensive during hospitalization.  However, there were some hypertensive readings nearing his discharge secondary to his 5-day steroid course described low.  Was normotensive on day of discharge.   Elevated LFTs, resolved AST at 73 with alk phos of 285. This is likely in the setting of known hepatic lesions.  AST 73>38, ALT 41>32 both improved. Elevated ALP 285>207 remained stable.   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 In setting of malignancy his Eliquis 5 mg twice daily was continued.   PAD, chronic, stable History of left AKA in 2021, chronic, stable Has prosthesis and wheelchair at home. He has not been able to use prosthesis or ambulate since starting chemotherapy due to worsening weakness.  PT OT evaluation recommended home health PT OT.  His home Eliquis 5 mg twice daily and Plavix 75 mg daily were restarted during his hospitalization.  His home atorvastatin 40 mg daily was also  restarted   Rheumatoid arthritis, chronic stable Patient had significant ulnar deviation to MCPs bilaterally with tenderness to palpation. He was started on methotrexate, folic acid, and meloxicam by PCP 11/23. Prior history of taking DMARDs.  Meloxicam and folic acid supplement were restarted during hospitalization.  Methotrexate was held as patient initially presented with vomiting/nausea with electrolyte depletion in the setting of GI upset from chemotherapy.  Patient endorsed significant pain and discomfort related to affected joints, was started on 5-day course of prednisone 40 mg tablet and oxycodone 5 mg every 4 hours as needed for severe pain.  Pain control was ultimately achieved when oxycodone was increased to 10 mg every 4 hours as needed.    Normocytic anemia, chronic stable Hgb at 12.5 which above baseline of 10-11 with MCV of 93.5.  02/11/2022-ferritin 151, iron 13, percent saturation 5%.  Supplemental ferrous sulfate 325 mg daily was provided.  Hemoglobin remained stable during hospitalization.   COPD, chronic stable Emphysema on CT, no recent PFTs. Last PFT 2013: FEV1/FVC 71% -> 81% post bronchodilator. SVC 2.7L, DLCO reduced but corrected for the inhaled alveolar volume. These results were consistent with mixed obstructive and restrictive disease.  Patient remained on room air during hospitalization.  Had complaint of shortness of breath that improved with the addition of a Dulera inhaler.   Discharge Exam:   BP (!) 139/92 (BP Location: Left Arm)   Pulse 67   Temp 97.7 F (36.5 C) (Oral)   Resp 19   Ht 5' 7"$  (1.702 m)   Wt 63 kg   SpO2 97%   BMI 21.75 kg/m  Discharge exam:  Physical Exam General: No acute distress. Awake and conversant. Eyes: Normal conjunctiva, anicteric. Round symmetric pupils. ENT: Hearing grossly intact. No nasal discharge. Neck: Neck is supple. No masses or thyromegaly. Respiratory: Respirations are non-labored. Lungs clear to auscultation  bilaterally.  Skin: Warm. No rashes or ulcers. Psych: Alert and oriented. Cooperative, Appropriate mood and affect, Normal judgment. CV: No lower extremity edema. MSK: No clubbing or cyanosis. Neuro: Sensation and CN II-XII grossly normal.   Pertinent Labs, Studies, and Procedures:     Latest Ref Rng & Units 08/08/2022    7:18 AM 08/07/2022   10:45 AM 08/06/2022    4:45 AM  CBC  WBC 4.0 - 10.5 K/uL 5.7  6.5  4.6   Hemoglobin 13.0 - 17.0 g/dL  10.9  10.7  9.9   Hematocrit 39.0 - 52.0 % 33.9  34.3  30.9   Platelets 150 - 400 K/uL 121  112  98        Latest Ref Rng & Units 08/09/2022    2:16 AM 08/08/2022    6:26 AM 08/07/2022   10:45 AM  BMP  Glucose 70 - 99 mg/dL 121  317  102   BUN 8 - 23 mg/dL 28  19  14   $ Creatinine 0.61 - 1.24 mg/dL 0.82  0.77  0.71   Sodium 135 - 145 mmol/L 135  134  139   Potassium 3.5 - 5.1 mmol/L 4.1  3.6  2.9   Chloride 98 - 111 mmol/L 102  98  104   CO2 22 - 32 mmol/L 24  20  26   $ Calcium 8.9 - 10.3 mg/dL 8.5  7.8  8.7        Component Value Date/Time   AFPTUMOR 2.4 02/11/2022 1510   LDH 264 (H) 02/11/2022 0915   CEA1 287.0 (H) 02/11/2022 1510   CEA 350.67 (H) 07/17/2022 0943   No results found.  Discharge Instructions: Discharge Instructions     Call MD for:  extreme fatigue   Complete by: As directed    Call MD for:  persistant dizziness or light-headedness   Complete by: As directed    Diet - low sodium heart healthy   Complete by: As directed    Discharge instructions   Complete by: As directed    Hello Mr. Dalomba,  On behalf of the internal medicine team, it has been a pleasure caring for you during your hospitalization. You were admitted to the hospital for an irregular heart beat, requiring an electrical shock and medications to correct it. During your hospitalization we also managed your pain related to your arthritis and shortness of breath related to your COPD. We collborated with the Cardiologist to manage your medications for you  heart.  For your irregular heart beat, please take the following medications: -Amiodarone: Please take the 418m pill twice a day for 6 days, you will finish this pill on 08/15/2022.   After this, you will take the Amiodarone 200 mg pill twice a day indefinitely, or unless changed by the Cardiologist at the follow up visit.   -Imdur: Take one 30 mg pill once a day, every day. You can start taking this tomorrow, 08/10/2022.   For your RA and chronic pain, please take the following medications: -Continue your home dose of methotrexate 7.5 mg as previously prescribed -Continue your Meloxicam 7.5 mg as previously prescribed - You can use Voltaren gel to apply to the affected joints to help with inflammation - You can take two 5 mg Oxycodone pills every 4 hours as needed for severe pain until your follow up appointment with Dr. ZMarkus Jarvisin 2 weeks.   For your COPD, please take the following medications: -Albuterol inhaler, two puffs, as needed for shortness of breath -Dulera inhaler, two puffs, as needed for shortness of breath  For your nausea: -Zofran 8 mg as needed for nause or vomiting. You can take this medication every 8 hours.   For your blood thinners, please take the following medications: - Plavix 75 mg, please take one pill a day -  Eliquis 5 mg, please take two pills a day  For your high blood pressure -Continue taking Amlodipine 10 mg, once a day.  For your high cholesterol - Continue taking Lipitor 40 mg  once a day  --For all other home medications not mentioned, please take as directed before you came to the hospital--  You will be contacted by the internal medicine clinic for a follow up appointment with Dr. Markus Jarvis.  You will be contacted by the [facility] for a follow up appointment.   Face-to-face encounter (required for Medicare/Medicaid patients)   Complete by: As directed    I Lanora Reveron Carrion-Carrero certify that this patient is under my care and that I, or  a nurse practitioner or physician's assistant working with me, had a face-to-face encounter that meets the physician face-to-face encounter requirements with this patient on 08/09/2022. The encounter with the patient was in whole, or in part for the following medical condition(s) which is the primary reason for home health care (List medical condition): Rheumatoid arthritis, left AKA, metastatic cancer   The encounter with the patient was in whole, or in part, for the following medical condition, which is the primary reason for home health care: Decline in funcitonal status   I certify that, based on my findings, the following services are medically necessary home health services: Physical therapy   Reason for Medically Necessary Home Health Services:  Skilled Nursing- Change/Decline in Patient Status Therapy- Instruction on use of Assistive Device for Ambulation on all Surfaces     My clinical findings support the need for the above services: Unsafe ambulation due to balance issues   Further, I certify that my clinical findings support that this patient is homebound due to: Ambulates short distances less than 300 feet   Home Health   Complete by: As directed    To provide the following care/treatments: PT   Increase activity slowly   Complete by: As directed        Signed: Christene Slates, MD 08/09/2022, 4:41 PM   Pager: [phone]

## 2022-08-09 NOTE — TOC Initial Note (Signed)
Transition of Care [facility]) - Initial/Assessment Note    Patient Details  Name: [name] [name] MRN: [id number] Date of Birth: Oct 09, 1960  Transition of Care [facility]) CM/SW Contact:    Bethena Roys, RN Phone Number: 08/09/2022, 12:02 PM  Clinical Narrative:  Risk for readmission assessment completed. PTA patient was from home with his caregivers. Plan will be to return home with home health services. Medicare.gov list provided to the patient and he did not have a preference. [facility] is able to accept the patient for services. Start of care to be initiated within 24-48 hours post transition home. Patient has DME WC and Case Manager ordered slide board for the home. [employer] will deliver the DME to the room. No further needs identified at this time.l                   Expected Discharge Plan: [place] Barriers to Discharge: No Barriers Identified   Patient Goals and CMS Choice Patient states their goals for this hospitalization and ongoing recovery are:: to return home with [place]. CMS Medicare.gov Compare Post Acute Care list provided to:: Patient Choice offered to / list presented to : Patient      Expected Discharge Plan and Services In-house Referral: NA Discharge Planning Services: CM Consult Post Acute Care Choice: [place] arrangements for the past 2 months: Single Family Home                 DME Arranged: Other see comment (slide board) DME Agency: [facility] Date DME Agency Contacted: 08/09/22 Time DME Agency Contacted: 78 Representative spoke with at DME Agency: Erasmo Downer HH Arranged: PT, OT HH Agency: [facility] Date [place]: 08/09/22 Time [place]: 6 Representative spoke with at [place]: [place] Arrangements/Services Living arrangements for the past 2 months: [place] with:: Other (Comment) (caregivers) Patient language and need for interpreter  reviewed:: Yes Do you feel safe going back to the place where you live?: Yes      Need for Family Participation in Patient Care: Yes (Comment) Care giver support system in place?: Yes (comment)   Criminal Activity/Legal Involvement Pertinent to Current Situation/Hospitalization: No - Comment as needed  Activities of Daily Living Home Assistive Devices/Equipment: Wheelchair, Environmental consultant (specify type) ADL Screening (condition at time of admission) Patient's cognitive ability adequate to safely complete daily activities?: Yes Is the patient deaf or have difficulty hearing?: No Does the patient have difficulty seeing, even when wearing glasses/contacts?: No Does the patient have difficulty concentrating, remembering, or making decisions?: No Patient able to express need for assistance with ADLs?: Yes Does the patient have difficulty dressing or bathing?: No Independently performs ADLs?: Yes (appropriate for developmental age) Does the patient have difficulty walking or climbing stairs?: Yes Weakness of Legs: None Weakness of Arms/Hands: Both  Permission Sought/Granted Permission sought to share information with : Family Supports, Customer service manager, Case Optician, dispensing granted to share information with : Yes, Verbal Permission Granted     Permission granted to share info w AGENCY: [facility], [facility]        Emotional Assessment Appearance:: Appears stated age Attitude/Demeanor/Rapport: Engaged Affect (typically observed): Appropriate Orientation: : Oriented to Self, Oriented to Place, Oriented to  Time, Oriented to Situation Alcohol / Substance Use: Not Applicable Psych Involvement: No (comment)  Admission diagnosis:  Hypokalemia [E87.6] Ventricular tachycardia ([place]) [I47.20] V tach ([place]) [I47.20] Patient Active Problem List  Diagnosis Date Noted   Protein-calorie malnutrition, severe 08/08/2022   Ventricular tachycardia ([place]) 08/04/2022   Dyspnea  06/15/2022   Ventricular bigeminy 04/30/2022   Multiple closed facial bone fractures, initial encounter ([place]) 04/30/2022   CAD s/p CABG 2020, STEMI 01/2022 04/30/2022   Demand ischemia of myocardium    NSTEMI (non-ST elevated myocardial infarction) ([place]) 04/09/2022   Esophageal cancer, stage IV ([place]) 123456   Acute metabolic encephalopathy 123XX123   Stroke ([place]) 02/28/2022   Adenocarcinoma of unknown origin ([place]) 02/22/2022   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), left ([place]) 01/31/2022   Cardiac arrest ([place]) 01/31/2022   Anoxic brain injury ([place]) 01/31/2022   Thrombocytopenia ([place]) 01/31/2022   Pulmonary emboli ([place]) 01/31/2022   Aspiration pneumonia ([place]) 01/31/2022   PAD (peripheral artery disease) (HCC)    Hypokalemia 05/30/2018   S/P lumbar fusion, Lower back pain 01/03/2018   Coronary artery disease    Tobacco use disorder 08/20/2012   Normocytic anemia 08/06/2012   HTN (hypertension) 04/20/2011   Chronic pain syndrome 02/22/2010   HLD (hyperlipidemia) 10/08/2009   Rheumatoid arthritis ([place]) 08/11/2009   PCP:  Angelique Blonder, DO Pharmacy:   [place], [place] - [address] [place] [address] Phone: [phone] Fax: [phone]  [facility] [address] [address] Phone: [phone] Fax: [phone]  Social Determinants of Health (SDOH) Social History: SDOH Screenings   Food Insecurity: No Food Insecurity (08/05/2022)  Housing: Low Risk  (08/05/2022)  Transportation Needs: Unmet Transportation Needs (08/05/2022)  Utilities: Not At Risk (08/05/2022)  Depression (PHQ2-9): Low Risk  (06/15/2022)  Financial Resource Strain: Low Risk  (05/05/2022)  Tobacco Use: Medium Risk (08/05/2022)   SDOH Interventions: Transportation Interventions: Other (Comment) (CSW provided patient with Transportation resources. Patient accepted.)   Readmission Risk  Interventions    08/09/2022   11:59 AM 05/02/2022    4:00 PM 03/15/2022    1:24 PM  Readmission Risk Prevention Plan  Transportation Screening Complete Complete Complete  Medication Review Press photographer) Complete Complete Complete  HRI or Home Care Consult Complete Complete Complete  SW Recovery Care/Counseling Consult Complete Complete Complete  Palliative Care Screening Not Applicable Not Applicable Not Applicable  Skilled Nursing Facility Not Applicable Complete Not Applicable

## 2022-08-09 NOTE — Progress Notes (Signed)
IP PROGRESS NOTE  Subjective:   [name] [name] is well-known to me with a history of metastatic gastroesophageal cancer.  He presented to the emergency room by EMS on 08/04/2022 after an episode of dizziness and shortness of breath.  He was found to be in ventricular tachycardia by EMS and underwent cardioversion.  He was noted to have hypokalemia and hypomagnesemia.  He was seen in consultation by the EP service and treated with amiodarone  [name] [name] reports feeling well.  He was last treated with oxaliplatin and trastuzumab on 07/17/2022.  Reports mild nausea following chemotherapy.  Has mild cold sensitivity.  No other neuropathy symptoms.   Objective: Vital signs in last 24 hours: Blood pressure (!) 139/92, pulse 67, temperature 97.7 F (36.5 C), temperature source Oral, resp. rate 19, height 5' 7"$  (1.702 m), weight 138 lb 14.2 oz (63 kg), SpO2 97 %.  Intake/Output from previous day: 02/13 0701 - 02/14 0700 In: 240 [P.O.:240] Out: 400 [Urine:400]  Physical Exam:  HEENT: No thrush Lungs: Clear bilaterally Cardiac: Regular rate and rhythm Abdomen: Nontender, no hepatosplenomegaly, no mass Extremities: No right leg edema Neurologic: Alert and oriented  Portacath/PICC-without erythema  Lab Results: Recent Labs    08/07/22 1045 08/08/22 0718  WBC 6.5 5.7  HGB 10.7* 10.9*  HCT 34.3* 33.9*  PLT 112* 121*    BMET Recent Labs    08/08/22 0626 08/09/22 0216  NA 134* 135  K 3.6 4.1  CL 98 102  CO2 20* 24  GLUCOSE 317* 121*  BUN 19 28*  CREATININE 0.77 0.82  CALCIUM 7.8* 8.5*    Lab Results  Component Value Date   CEA1 287.0 (H) 02/11/2022   CEA 350.67 (H) 07/17/2022   [id number] 88 (H) 02/11/2022    Medications: I have reviewed the patient's current medications.  Assessment/Plan:  1.Metastatic adenocarcinoma, likely esophagus primary - Liver mass, mass or lymph node conglomerate near the GE junction, gastrohepatic ligament and retroperitoneal  lymphadenopathy. -02/11/2022 CTA chest-7.8 x 8.5 cm subtle hypoattenuating mass lesion in segment IV of the liver. -02/11/2022 CT abdomen/pelvis-8.7 x 7.8 or ill-defined irregular mass in the medial segment left liver, 4.5 x 3.4 cm necrotic mass lesion just cranial to the esophagogastric junction with metastatic lymphadenopathy in the abdomen and pelvis, irregular nodular peritoneal thickening in the lower abdomen and pelvis -02/11/2022 MRI abdomen-rim-enhancing likely necrotic mass or lymph node conglomerate centered around the gastroesophageal junction measuring 5.0 x 3.9 cm, large rim-enhancing likely necrotic liver mass centered in the anterior left lobe of the liver measuring 7.8 x 7.2 cm, enlarged gastrohepatic ligament and retroperitoneal lymph nodes -Labs from 02/11/2022-AFP 2.4, CA 19.9 88, CEA 287 -Ultrasound-guided liver biopsy performed 02/15/2022-adenocarcinoma with extensive necrosis, CK7 positive, negative for CK20, CDX2, TTF-1, and PAX8, consistent with a pancreatic, lung, or upper gastrointestinal primary -PET 03/08/2022-hypermetabolic distal esophagus mass, hypermetabolic centrally necrotic hepatic mass, hypermetabolic periportal nodes, exophytic GE junction mass without hypermetabolism-likely benign lesion, no hypermetabolism in the pancreas head, bilateral hypermetabolic adrenal gland lesions, small hypermetabolic left parotid gland lesion.  No loss of mismatch repair protein expression, HER2 3+ -CTs 03/11/2022-no change from PET 03/08/2022, known distal esophagus mass-not visualized on unenhanced CT, large hepatic metastasis and upper abdominal nodal metastases, chronic pancreatitis with 4.3 cm pseudocyst in the pancreas head -Cycle 1 FOLFOX, trastuzumab, Pembrolizumab 04/06/2022 -Cycle 2 oxaliplatin, 5-FU bolus/pump and trastuzumab held due to hospitalization following cycle 1 with possible coronary vasospasm -Pembrolizumab 04/25/2022 -CT abdomen/pelvis 05/02/2022-increased peripancreatic  edema, increased size of pancreas head pseudocyst, increased  necrotic nodal masses at the GE junction -Cycle 3 oxaliplatin, trastuzumab, pembrolizumab 05/15/2022; 5-FU held secondary to cardiac toxicity following cycle 1 -Cycle 4 oxaliplatin, trastuzumab 05/29/2022, 5-FU held secondary to cardiac toxicity -Cycle 5 oxaliplatin, trastuzumab, Pembrolizumab (6-week dosing for Pembrolizumab) 06/12/2022, 5-FU held secondary to cardiac toxicity -Cycle 6 oxaliplatin, trastuzumab 06/28/2022, 5-FU held -Cycle 7 oxaliplatin, trastuzumab 07/17/2022, 5-FU held 2.  Myocardial infarction/cardiac arrest 01/31/2022 -Echocardiogram 02/01/2022-LVEF 60-65%, right ventricle severely dilated with hypokinesis in the basal and midportion, hypokinesis in the apical portion, right ventricular systolic function moderately reduced -Echocardiogram on 05/01/2022, LVEF 50-55%, decreased right ventricular systolic function, moderately enlarged right ventricle 3.  Microcytic anemia -Labs from 02/11/2022-ferritin 151, iron 13, percent saturation 5% 4.  DVT/pulmonary embolism-bilateral lower extremity DVTs 01/10/2022, pulmonary emboli on chest CT 01/31/2022-apixaban 5.  PAD status post AKA in 2021 6.  RA 7.  Bilateral pleural effusions -Ultrasound-guided thoracentesis performed on 02/11/2022.  Cytology negative for malignancy. 8.  COPD 9.  Admission 03/11/2022 with E. coli bacteremia Ultrasound-guided biopsy/aspiration of dominant necrotic appearing liver lesion 03/17/2022-scant fluid aspirated-negative culture, no organisms or white cells.  Pathology with necrotic tissue. 10.  Admission 04/29/2022 after a fall, multiple skull fractures 11.  Fever, hypotension-Klebsiella bacteremia on blood culture 04/30/2022 12.  Admission 04/09/2022 with chest pain, NSTEMI 13.  Admission 08/04/2022 with ventricular tachycardia, status post cardioversion, noted to have hypokalemia and hypomagnesemia, started on amiodarone    [name] [name] has a history of metastatic  gastroesophageal cancer.  He has been treated with systemic therapy since October 2023.  5-FU was placed on hold following cycle 1 due to an admission with chest pain and NSTEMI.  [name] [name] has tolerated the oxaliplatin, trastuzumab, and pembrolizumab well.  I reviewed the restaging CT images.  I discussed the findings with [name] [name].  The CTs are consistent with a mixed response.  There has been a significant decrease in the size of the dominant liver mass and gastroesophageal mass.  There may be a small new lung nodule, slight enlargement of epicardial lymph nodes, and tiny new liver lesions.  The CEA has been lower compared to when he was diagnosed last year.  I do not feel the arrhythmias are related to the current systemic therapy regimen.  I recommend continuing systemic therapy.  I will discuss the indication for resuming 5-fluorouracil with the cardiology team.  [name] [name] is scheduled for follow-up at the cancer center on 08/14/2022.         LOS: 5 days   Betsy Coder, MD   08/09/2022, 6:50 AM

## 2022-08-09 NOTE — Plan of Care (Signed)
Both goals met

## 2022-08-10 ENCOUNTER — Telehealth: Payer: Self-pay

## 2022-08-10 ENCOUNTER — Other Ambulatory Visit: Payer: Self-pay

## 2022-08-10 NOTE — Transitions of Care (Post Inpatient/ED Visit) (Signed)
   08/10/2022  Name: [name] [name] MRN: [id number] DOB: April 30, 1961  Today's TOC FU Call Status: Today's TOC FU Call Status:: Successful TOC FU Call Competed TOC FU Call Complete Date: 08/10/22  Transition Care Management Follow-up Telephone Call Date of Discharge: 08/09/22 Discharge Facility: [facility] Type of Discharge: Inpatient Admission Primary Inpatient Discharge Diagnosis:: Tachycardia, Protein Calorie Malnutrition How have you been since you were released from the hospital?: Better Any questions or concerns?: No  Items Reviewed: Did you receive and understand the discharge instructions provided?: Yes Medications obtained and verified?: Yes (Medications Reviewed) Any new allergies since your discharge?: No Dietary orders reviewed?: Yes Type of Diet Ordered:: addition of nutrition supplements 3x day Do you have support at home?: Yes People in Home: significant other Name of Support/Comfort Primary Source: [facility] and Equipment/Supplies: [place] Ordered?: Yes Name of [place]:: [place] set up a time to come to your home?: Yes [facility] Visit Date: 08/11/22 Any new equipment or medical supplies ordered?: No  Functional Questionnaire: Do you need assistance with bathing/showering or dressing?: Yes Do you need assistance with meal preparation?: Yes Do you need assistance with eating?: No Do you have difficulty maintaining continence: No Do you need assistance with getting out of bed/getting out of a chair/moving?: Yes Do you have difficulty managing or taking your medications?: No  Folllow up appointments reviewed: PCP Follow-up appointment confirmed?: Yes Date of PCP follow-up appointment?: 08/16/22 Follow-up Provider: Dr. Sanjuana Mae Specialist [facility] Follow-up appointment confirmed?: Yes Date of Specialist follow-up appointment?: 08/29/22 Follow-Up Specialty Provider:: Dr. Chalmers Cater (Cardiology) Do you  need transportation to your follow-up appointment?: No Do you understand care options if your condition(s) worsen?: Yes-patient verbalized understanding  SDOH Interventions Today    Flowsheet Row Most Recent Value  SDOH Interventions   Food Insecurity Interventions Intervention Not Indicated  Housing Interventions Intervention Not Indicated      [name], RN, BSN, CCM Care Management Coordinator [facility] Phone: [phone]: [phone]

## 2022-08-10 NOTE — Progress Notes (Signed)
  Progress Note   Date: 08/09/2022  Patient Name: [name] [name]        MRN#: [id number]  Review the patient's clinical findings supports the diagnosis of:   Type 2 MI

## 2022-08-10 NOTE — Progress Notes (Signed)
  Progress Note   Date: 08/09/2022  Patient Name: [name] [name]        MRN#: [id number]  Clarification of diagnosis:  severe malnutrition

## 2022-08-12 DIAGNOSIS — D630 Anemia in neoplastic disease: Secondary | ICD-10-CM | POA: Diagnosis not present

## 2022-08-12 DIAGNOSIS — J439 Emphysema, unspecified: Secondary | ICD-10-CM | POA: Diagnosis not present

## 2022-08-12 DIAGNOSIS — Z8616 Personal history of COVID-19: Secondary | ICD-10-CM | POA: Diagnosis not present

## 2022-08-12 DIAGNOSIS — M5137 Other intervertebral disc degeneration, lumbosacral region: Secondary | ICD-10-CM | POA: Diagnosis not present

## 2022-08-12 DIAGNOSIS — Z8673 Personal history of transient ischemic attack (TIA), and cerebral infarction without residual deficits: Secondary | ICD-10-CM | POA: Diagnosis not present

## 2022-08-12 DIAGNOSIS — C771 Secondary and unspecified malignant neoplasm of intrathoracic lymph nodes: Secondary | ICD-10-CM | POA: Diagnosis not present

## 2022-08-12 DIAGNOSIS — Z9181 History of falling: Secondary | ICD-10-CM | POA: Diagnosis not present

## 2022-08-12 DIAGNOSIS — Z7902 Long term (current) use of antithrombotics/antiplatelets: Secondary | ICD-10-CM | POA: Diagnosis not present

## 2022-08-12 DIAGNOSIS — M069 Rheumatoid arthritis, unspecified: Secondary | ICD-10-CM | POA: Diagnosis not present

## 2022-08-12 DIAGNOSIS — J9601 Acute respiratory failure with hypoxia: Secondary | ICD-10-CM | POA: Diagnosis not present

## 2022-08-12 DIAGNOSIS — Z89612 Acquired absence of left leg above knee: Secondary | ICD-10-CM | POA: Diagnosis not present

## 2022-08-12 DIAGNOSIS — E43 Unspecified severe protein-calorie malnutrition: Secondary | ICD-10-CM | POA: Diagnosis not present

## 2022-08-12 DIAGNOSIS — I739 Peripheral vascular disease, unspecified: Secondary | ICD-10-CM | POA: Diagnosis not present

## 2022-08-12 DIAGNOSIS — I10 Essential (primary) hypertension: Secondary | ICD-10-CM | POA: Diagnosis not present

## 2022-08-12 DIAGNOSIS — I472 Ventricular tachycardia, unspecified: Secondary | ICD-10-CM | POA: Diagnosis not present

## 2022-08-12 DIAGNOSIS — I252 Old myocardial infarction: Secondary | ICD-10-CM | POA: Diagnosis not present

## 2022-08-12 DIAGNOSIS — Z86718 Personal history of other venous thrombosis and embolism: Secondary | ICD-10-CM | POA: Diagnosis not present

## 2022-08-12 DIAGNOSIS — I2510 Atherosclerotic heart disease of native coronary artery without angina pectoris: Secondary | ICD-10-CM | POA: Diagnosis not present

## 2022-08-12 DIAGNOSIS — J449 Chronic obstructive pulmonary disease, unspecified: Secondary | ICD-10-CM | POA: Diagnosis not present

## 2022-08-12 DIAGNOSIS — K219 Gastro-esophageal reflux disease without esophagitis: Secondary | ICD-10-CM | POA: Diagnosis not present

## 2022-08-12 DIAGNOSIS — Z79891 Long term (current) use of opiate analgesic: Secondary | ICD-10-CM | POA: Diagnosis not present

## 2022-08-13 ENCOUNTER — Other Ambulatory Visit: Payer: Self-pay | Admitting: Oncology

## 2022-08-13 DIAGNOSIS — C159 Malignant neoplasm of esophagus, unspecified: Secondary | ICD-10-CM

## 2022-08-13 DIAGNOSIS — C801 Malignant (primary) neoplasm, unspecified: Secondary | ICD-10-CM

## 2022-08-14 ENCOUNTER — Other Ambulatory Visit: Payer: Self-pay | Admitting: *Deleted

## 2022-08-14 ENCOUNTER — Inpatient Hospital Stay: Payer: 59

## 2022-08-14 ENCOUNTER — Encounter: Payer: Self-pay | Admitting: *Deleted

## 2022-08-14 ENCOUNTER — Inpatient Hospital Stay (HOSPITAL_BASED_OUTPATIENT_CLINIC_OR_DEPARTMENT_OTHER): Payer: 59 | Admitting: Oncology

## 2022-08-14 ENCOUNTER — Inpatient Hospital Stay: Payer: 59 | Attending: Oncology

## 2022-08-14 VITALS — BP 118/77 | HR 71

## 2022-08-14 DIAGNOSIS — E876 Hypokalemia: Secondary | ICD-10-CM | POA: Insufficient documentation

## 2022-08-14 DIAGNOSIS — J449 Chronic obstructive pulmonary disease, unspecified: Secondary | ICD-10-CM | POA: Diagnosis not present

## 2022-08-14 DIAGNOSIS — Z5111 Encounter for antineoplastic chemotherapy: Secondary | ICD-10-CM | POA: Insufficient documentation

## 2022-08-14 DIAGNOSIS — J90 Pleural effusion, not elsewhere classified: Secondary | ICD-10-CM | POA: Diagnosis not present

## 2022-08-14 DIAGNOSIS — C801 Malignant (primary) neoplasm, unspecified: Secondary | ICD-10-CM

## 2022-08-14 DIAGNOSIS — C159 Malignant neoplasm of esophagus, unspecified: Secondary | ICD-10-CM

## 2022-08-14 DIAGNOSIS — D509 Iron deficiency anemia, unspecified: Secondary | ICD-10-CM | POA: Diagnosis not present

## 2022-08-14 DIAGNOSIS — Z79899 Other long term (current) drug therapy: Secondary | ICD-10-CM | POA: Diagnosis not present

## 2022-08-14 DIAGNOSIS — R970 Elevated carcinoembryonic antigen [CEA]: Secondary | ICD-10-CM | POA: Insufficient documentation

## 2022-08-14 DIAGNOSIS — C787 Secondary malignant neoplasm of liver and intrahepatic bile duct: Secondary | ICD-10-CM | POA: Diagnosis not present

## 2022-08-14 DIAGNOSIS — K861 Other chronic pancreatitis: Secondary | ICD-10-CM | POA: Insufficient documentation

## 2022-08-14 DIAGNOSIS — K2289 Other specified disease of esophagus: Secondary | ICD-10-CM | POA: Diagnosis not present

## 2022-08-14 LAB — CMP (CANCER CENTER ONLY)
ALT: 66 U/L — ABNORMAL HIGH (ref 0–44)
AST: 67 U/L — ABNORMAL HIGH (ref 15–41)
Albumin: 3.7 g/dL (ref 3.5–5.0)
Alkaline Phosphatase: 204 U/L — ABNORMAL HIGH (ref 38–126)
Anion gap: 8 (ref 5–15)
BUN: 22 mg/dL (ref 8–23)
CO2: 25 mmol/L (ref 22–32)
Calcium: 9.3 mg/dL (ref 8.9–10.3)
Chloride: 103 mmol/L (ref 98–111)
Creatinine: 0.87 mg/dL (ref 0.61–1.24)
GFR, Estimated: >60 mL/min (ref >60)
Glucose, Bld: 121 mg/dL — ABNORMAL HIGH (ref 70–99)
Potassium: 4 mmol/L (ref 3.5–5.1)
Sodium: 136 mmol/L (ref 135–145)
Total Bilirubin: 0.4 mg/dL (ref 0.3–1.2)
Total Protein: 7 g/dL (ref 6.5–8.1)

## 2022-08-14 LAB — CBC WITH DIFFERENTIAL (CANCER CENTER ONLY)
Abs Immature Granulocytes: 0.01 10*3/uL (ref 0.00–0.07)
Basophils Absolute: 0 10*3/uL (ref 0.0–0.1)
Basophils Relative: 1 %
Eosinophils Absolute: 0.2 10*3/uL (ref 0.0–0.5)
Eosinophils Relative: 4 %
HCT: 38 % — ABNORMAL LOW (ref 39.0–52.0)
Hemoglobin: 11.9 g/dL — ABNORMAL LOW (ref 13.0–17.0)
Immature Granulocytes: 0 %
Lymphocytes Relative: 18 %
Lymphs Abs: 1.1 10*3/uL (ref 0.7–4.0)
MCH: 28.3 pg (ref 26.0–34.0)
MCHC: 31.3 g/dL (ref 30.0–36.0)
MCV: 90.3 fL (ref 80.0–100.0)
Monocytes Absolute: 0.5 10*3/uL (ref 0.1–1.0)
Monocytes Relative: 9 %
Neutro Abs: 4.3 10*3/uL (ref 1.7–7.7)
Neutrophils Relative %: 68 %
Platelet Count: 174 10*3/uL (ref 150–400)
RBC: 4.21 MIL/uL — ABNORMAL LOW (ref 4.22–5.81)
RDW: 18.8 % — ABNORMAL HIGH (ref 11.5–15.5)
WBC Count: 6.2 10*3/uL (ref 4.0–10.5)
nRBC: 0 % (ref 0.0–0.2)

## 2022-08-14 LAB — CEA (ACCESS): CEA (CHCC): 393.29 ng/mL — ABNORMAL HIGH (ref 0.00–5.00)

## 2022-08-14 LAB — TSH: TSH: 2.152 u[IU]/mL (ref 0.350–4.500)

## 2022-08-14 LAB — MAGNESIUM: Magnesium: 1.7 mg/dL (ref 1.7–2.4)

## 2022-08-14 MED ORDER — SODIUM CHLORIDE 0.9 % IV SOLN
400.0000 mg | Freq: Once | INTRAVENOUS | Status: AC
  Administered 2022-08-14: 400 mg via INTRAVENOUS
  Filled 2022-08-14: qty 16

## 2022-08-14 MED ORDER — SODIUM CHLORIDE 0.9% FLUSH
10.0000 mL | INTRAVENOUS | Status: DC | PRN
  Administered 2022-08-14: 10 mL

## 2022-08-14 MED ORDER — DIPHENHYDRAMINE HCL 25 MG PO CAPS
50.0000 mg | ORAL_CAPSULE | Freq: Once | ORAL | Status: AC
  Administered 2022-08-14: 50 mg via ORAL
  Filled 2022-08-14: qty 2

## 2022-08-14 MED ORDER — OXALIPLATIN CHEMO INJECTION 100 MG/20ML
85.0000 mg/m2 | Freq: Once | INTRAVENOUS | Status: AC
  Administered 2022-08-14: 150 mg via INTRAVENOUS
  Filled 2022-08-14: qty 20

## 2022-08-14 MED ORDER — PALONOSETRON HCL INJECTION 0.25 MG/5ML
0.2500 mg | Freq: Once | INTRAVENOUS | Status: AC
  Administered 2022-08-14: 0.25 mg via INTRAVENOUS
  Filled 2022-08-14: qty 5

## 2022-08-14 MED ORDER — HEPARIN SOD (PORK) LOCK FLUSH 100 UNIT/ML IV SOLN
500.0000 [IU] | Freq: Once | INTRAVENOUS | Status: AC | PRN
  Administered 2022-08-14: 500 [IU]

## 2022-08-14 MED ORDER — SODIUM CHLORIDE 0.9 % IV SOLN
10.0000 mg | Freq: Once | INTRAVENOUS | Status: AC
  Administered 2022-08-14: 10 mg via INTRAVENOUS
  Filled 2022-08-14: qty 1

## 2022-08-14 MED ORDER — ACETAMINOPHEN 325 MG PO TABS
650.0000 mg | ORAL_TABLET | Freq: Once | ORAL | Status: AC
  Administered 2022-08-14: 650 mg via ORAL
  Filled 2022-08-14: qty 2

## 2022-08-14 MED ORDER — TRASTUZUMAB-ANNS CHEMO 150 MG IV SOLR: 4.0000 mg/kg | Freq: Once | INTRAVENOUS | Status: DC

## 2022-08-14 MED ORDER — DEXTROSE 5 % IV SOLN: Freq: Once | INTRAVENOUS | Status: AC

## 2022-08-14 MED ORDER — SODIUM CHLORIDE 0.9 % IV SOLN: Freq: Once | INTRAVENOUS | Status: AC

## 2022-08-14 NOTE — Progress Notes (Signed)
Kanjinti held today per MD.

## 2022-08-14 NOTE — Progress Notes (Signed)
[place] OFFICE PROGRESS NOTE   Diagnosis: Gastroesophageal cancer  INTERVAL HISTORY:   [name] [name] returns as scheduled.  He was hospitalized last week after after a presyncopal event and ventricular tachycardia.  He had a hypomagnesemia and hypokalemia.[name]  He was discharged 08/09/2022.  He continues amiodarone.  No complaint today.  Had appetite.  He has mild numbness in the right fingers.  Not on the left side.  Objective:  Vital signs in last 24 hours:  Blood pressure 108/69, pulse 68, temperature 98.1 F (36.7 C), temperature source Oral, resp. rate 18, height 5' 7"$  (1.702 m), weight 139 lb (63 kg), SpO2 98 %.    HEENT: No thrush or ulcers Resp: Lungs clear bilaterally, no respiratory distress Cardio: Regular rate and rhythm GI: Nontender, no hepatosplenomegaly Vascular: No right leg edema Neuro: Mild to moderate loss of vibratory sense at the fingertips bilaterally    Portacath/PICC-without erythema  Lab Results:  Lab Results  Component Value Date   WBC 6.2 08/14/2022   HGB 11.9 (L) 08/14/2022   HCT 38.0 (L) 08/14/2022   MCV 90.3 08/14/2022   PLT 174 08/14/2022   NEUTROABS 4.3 08/14/2022    CMP  Lab Results  Component Value Date   NA 136 08/14/2022   K 4.0 08/14/2022   CL 103 08/14/2022   CO2 25 08/14/2022   GLUCOSE 121 (H) 08/14/2022   BUN 22 08/14/2022   CREATININE 0.87 08/14/2022   CALCIUM 9.3 08/14/2022   PROT 7.0 08/14/2022   ALBUMIN 3.7 08/14/2022   AST 67 (H) 08/14/2022   ALT 66 (H) 08/14/2022   ALKPHOS 204 (H) 08/14/2022   BILITOT 0.4 08/14/2022   GFRNONAA >60 08/14/2022   GFRAA >60 01/24/2020    Lab Results  Component Value Date   CEA1 287.0 (H) 02/11/2022   CEA 350.67 (H) 07/17/2022   [id number] 88 (H) 02/11/2022   d the patient's current medications.   Assessment/Plan: .Metastatic adenocarcinoma, likely esophagus primary - Liver mass, mass or lymph node conglomerate near the GE junction, gastrohepatic ligament and  retroperitoneal lymphadenopathy. -02/11/2022 CTA chest-7.8 x 8.5 cm subtle hypoattenuating mass lesion in segment IV of the liver. -02/11/2022 CT abdomen/pelvis-8.7 x 7.8 or ill-defined irregular mass in the medial segment left liver, 4.5 x 3.4 cm necrotic mass lesion just cranial to the esophagogastric junction with metastatic lymphadenopathy in the abdomen and pelvis, irregular nodular peritoneal thickening in the lower abdomen and pelvis -02/11/2022 MRI abdomen-rim-enhancing likely necrotic mass or lymph node conglomerate centered around the gastroesophageal junction measuring 5.0 x 3.9 cm, large rim-enhancing likely necrotic liver mass centered in the anterior left lobe of the liver measuring 7.8 x 7.2 cm, enlarged gastrohepatic ligament and retroperitoneal lymph nodes -Labs from 02/11/2022-AFP 2.4, CA 19.9 88, CEA 287 -Ultrasound-guided liver biopsy performed 02/15/2022-adenocarcinoma with extensive necrosis, CK7 positive, negative for CK20, CDX2, TTF-1, and PAX8, consistent with a pancreatic, lung, or upper gastrointestinal primary -PET 03/08/2022-hypermetabolic distal esophagus mass, hypermetabolic centrally necrotic hepatic mass, hypermetabolic periportal nodes, exophytic GE junction mass without hypermetabolism-likely benign lesion, no hypermetabolism in the pancreas head, bilateral hypermetabolic adrenal gland lesions, small hypermetabolic left parotid gland lesion.  No loss of mismatch repair protein expression, HER2 3+ -CTs 03/11/2022-no change from PET 03/08/2022, known distal esophagus mass-not visualized on unenhanced CT, large hepatic metastasis and upper abdominal nodal metastases, chronic pancreatitis with 4.3 cm pseudocyst in the pancreas head -Cycle 1 FOLFOX, trastuzumab, Pembrolizumab 04/06/2022 -Cycle 2 oxaliplatin, 5-FU bolus/pump and trastuzumab held due to hospitalization following cycle 1  with possible coronary vasospasm -Pembrolizumab 04/25/2022 -CT abdomen/pelvis 05/02/2022-increased  peripancreatic edema, increased size of pancreas head pseudocyst, increased necrotic nodal masses at the GE junction -Cycle 3 oxaliplatin, trastuzumab, pembrolizumab 05/15/2022; 5-FU held secondary to cardiac toxicity following cycle 1 -Cycle 4 oxaliplatin, trastuzumab 05/29/2022, 5-FU held secondary to cardiac toxicity -Cycle 5 oxaliplatin, trastuzumab, Pembrolizumab (6-week dosing for Pembrolizumab) 06/12/2022, 5-FU held secondary to cardiac toxicity -Cycle 6 oxaliplatin, trastuzumab 06/28/2022, 5-FU held -Cycle 7 oxaliplatin, trastuzumab 07/17/2022, 5-FU held -CTs 07/19/2022-decrease in the dominant anterior liver mass, 2 new small liver lesions compared to 05/02/2022, new right lower lobe nodule, enlargement of epicardial lymph nodes, slight decrease in size of celiac axis, gastropathic ligament, and portacaval lymphadenopathy, unchanged esophageal thickening -Cycle 8 oxaliplatin and pembrolizumab 08/14/2022, 5-FU and trastuzumab held secondary to potential cardiac toxicity 2.  Myocardial infarction/cardiac arrest 01/31/2022 -Echocardiogram 02/01/2022-LVEF 60-65%, right ventricle severely dilated with hypokinesis in the basal and midportion, hypokinesis in the apical portion, right ventricular systolic function moderately reduced -Echocardiogram on 05/01/2022, LVEF 50-55%, decreased right ventricular systolic function, moderately enlarged right ventricle -Echocardiogram 08/05/2022-LVEF 45-50%, mildly decreased left ventricular function, moderate reduced right ventricular function 3.  Microcytic anemia -Labs from 02/11/2022-ferritin 151, iron 13, percent saturation 5% 4.  DVT/pulmonary embolism-bilateral lower extremity DVTs 01/10/2022, pulmonary emboli on chest CT 01/31/2022-apixaban 5.  PAD status post AKA in 2021 6.  RA 7.  Bilateral pleural effusions -Ultrasound-guided thoracentesis performed on 02/11/2022.  Cytology negative for malignancy. 8.  COPD 9.  Admission 03/11/2022 with E. coli  bacteremia Ultrasound-guided biopsy/aspiration of dominant necrotic appearing liver lesion 03/17/2022-scant fluid aspirated-negative culture, no organisms or white cells.  Pathology with necrotic tissue. 10.  Admission 04/29/2022 after a fall, multiple skull fractures 11.  Fever, hypotension-Klebsiella bacteremia on blood culture 04/30/2022 12.  Admission 04/09/2022 with chest pain, NSTEMI 13.  Admission 08/04/2022 with ventricular tachycardia, status post cardioversion, noted to have hypokalemia and hypomagnesemia, started on amiodarone     Disposition: Mr. Quattrochi appears stable.  He was admitted last week with ventricular tachycardia.  An echocardiogram revealed a slight decrease in the LVEF impaired to a previous echocardiogram.  It is unclear whether he has experienced cardiac toxicity from trastuzumab.  Trastuzumab will be held today.  He will complete another cycle of oxaliplatin and pembrolizumab.  His overall clinical status appears stable.  The restaging CTs reveal a mixed response.  The CEA is lower compared to the start of systemic therapy.  We will check the CEA today.  He will complete another cycle of oxaliplatin and pembrolizumab today.  He will return for an office visit and chemotherapy in 2 weeks.  We will follow the CEA closely and plan to obtain a restaging CT within the next 1-2 months.  [name] Coder, MD  08/14/2022  10:57 AM

## 2022-08-14 NOTE — Progress Notes (Addendum)
Patient seen by Dr. Benay Spice today  Vitals are within treatment parameters.  Labs reviewed by Dr. Benay Spice and are within treatment parameters. Per Dr. Benay Spice: OK for herceptin with ECHO LVEF 45-50% (MD aware of change)  Per physician team, Dr. Benay Spice decided to [facility] Herceptin today due to being on amiodarone and potential for bradycardia/cardiomyopathy concerns.

## 2022-08-14 NOTE — Patient Instructions (Signed)
[name] [name]   Discharge Instructions: Thank you for choosing [facility] to provide your oncology and hematology care.   If you have a lab appointment with the [facility], please go directly to the [facility] and check in at the registration area.   Wear comfortable clothing and clothing appropriate for easy access to any Portacath or PICC line.   We strive to give you quality time with your provider. You may need to reschedule your appointment if you arrive late (15 or more minutes).  Arriving late affects you and other patients whose appointments are after yours.  Also, if you miss three or more appointments without notifying the office, you may be dismissed from the clinic at the provider's discretion.      For prescription refill requests, have your pharmacy contact our office and allow 72 hours for refills to be completed.    Today you received the following chemotherapy and/or immunotherapy agents Pembrolizumab (KEYTRUDA) & Oxaliplatin (ELOXATIN).      To help prevent nausea and vomiting after your treatment, we encourage you to take your nausea medication as directed.  BELOW ARE SYMPTOMS THAT SHOULD BE REPORTED IMMEDIATELY: *FEVER GREATER THAN 100.4 F (38 C) OR HIGHER *CHILLS OR SWEATING *NAUSEA AND VOMITING THAT IS NOT CONTROLLED WITH YOUR NAUSEA MEDICATION *UNUSUAL SHORTNESS OF BREATH *UNUSUAL BRUISING OR BLEEDING *URINARY PROBLEMS (pain or burning when urinating, or frequent urination) *BOWEL PROBLEMS (unusual diarrhea, constipation, pain near the anus) TENDERNESS IN MOUTH AND THROAT WITH OR WITHOUT PRESENCE OF ULCERS (sore throat, sores in mouth, or a toothache) UNUSUAL RASH, SWELLING OR PAIN  UNUSUAL VAGINAL DISCHARGE OR ITCHING   Items with * indicate a potential emergency and should be followed up as soon as possible or go to the Emergency Department if any problems should occur.  Please show the CHEMOTHERAPY ALERT  CARD or IMMUNOTHERAPY ALERT CARD at check-in to the Emergency Department and triage nurse.  Should you have questions after your visit or need to cancel or reschedule your appointment, please contact [facility]  Dept: [phone]  and follow the prompts.  Office hours are 8:00 a.m. to 4:30 p.m. Monday - Friday. Please note that voicemails left after 4:00 p.m. may not be returned until the following business day.  We are closed weekends and major holidays. You have access to a nurse at all times for urgent questions. Please call the main number to the clinic Dept: [phone] and follow the prompts.   For any non-urgent questions, you may also contact your provider using MyChart. We now offer e-Visits for anyone 45 and older to request care online for non-urgent symptoms. For details visit [email or web address].   Also download the MyChart app! Go to the app store, search "MyChart", open the app, select [facility], and log in with your MyChart username and password.  Pembrolizumab Injection What is this medication? PEMBROLIZUMAB (PEM broe LIZ ue mab) treats some types of cancer. It works by helping your immune system slow or stop the spread of cancer cells. It is a monoclonal antibody. This medicine may be used for other purposes; ask your health care provider or pharmacist if you have questions. COMMON BRAND NAME(S): Keytruda What should I tell my care team before I take this medication? They need to know if you have any of these conditions: Allogeneic stem cell transplant (uses someone else's stem cells) Autoimmune diseases, such as Crohn disease, ulcerative colitis, lupus History of  chest radiation Nervous system problems, such as Guillain-Barre syndrome, myasthenia gravis Organ transplant An unusual or allergic reaction to pembrolizumab, other medications, foods, dyes, or preservatives Pregnant or trying to get pregnant Breast-feeding How should I  use this medication? This medication is injected into a vein. It is given by your care team in a hospital or clinic setting. A special MedGuide will be given to you before each treatment. Be sure to read this information carefully each time. Talk to your care team about the use of this medication in children. While it may be prescribed for children as young as 6 months for selected conditions, precautions do apply. Overdosage: If you think you have taken too much of this medicine contact a poison control center or emergency room at once. NOTE: This medicine is only for you. Do not share this medicine with others. What if I miss a dose? Keep appointments for follow-up doses. It is important not to miss your dose. Call your care team if you are unable to keep an appointment. What may interact with this medication? Interactions have not been studied. This list may not describe all possible interactions. Give your health care provider a list of all the medicines, herbs, non-prescription drugs, or dietary supplements you use. Also tell them if you smoke, drink alcohol, or use illegal drugs. Some items may interact with your medicine. What should I watch for while using this medication? Your condition will be monitored carefully while you are receiving this medication. You may need blood work while taking this medication. This medication may cause serious skin reactions. They can happen weeks to months after starting the medication. Contact your care team right away if you notice fevers or flu-like symptoms with a rash. The rash may be red or purple and then turn into blisters or peeling of the skin. You may also notice a red rash with swelling of the face, lips, or lymph nodes in your neck or under your arms. Tell your care team right away if you have any change in your eyesight. Talk to your care team if you may be pregnant. Serious birth defects can occur if you take this medication during pregnancy and  for 4 months after the last dose. You will need a negative pregnancy test before starting this medication. Contraception is recommended while taking this medication and for 4 months after the last dose. Your care team can help you find the option that works for you. Do not breastfeed while taking this medication and for 4 months after the last dose. What side effects may I notice from receiving this medication? Side effects that you should report to your care team as soon as possible: Allergic reactions--skin rash, itching, hives, swelling of the face, lips, tongue, or throat Dry cough, shortness of breath or trouble breathing Eye pain, redness, irritation, or discharge with blurry or decreased vision Heart muscle inflammation--unusual weakness or fatigue, shortness of breath, chest pain, fast or irregular heartbeat, dizziness, swelling of the ankles, feet, or hands Hormone gland problems--headache, sensitivity to light, unusual weakness or fatigue, dizziness, fast or irregular heartbeat, increased sensitivity to cold or heat, excessive sweating, constipation, hair loss, increased thirst or amount of urine, tremors or shaking, irritability Infusion reactions--chest pain, shortness of breath or trouble breathing, feeling faint or lightheaded Kidney injury (glomerulonephritis)--decrease in the amount of urine, red or dark brown urine, foamy or bubbly urine, swelling of the ankles, hands, or feet Liver injury--right upper belly pain, loss of appetite, nausea, light-colored stool,  dark yellow or brown urine, yellowing skin or eyes, unusual weakness or fatigue Pain, tingling, or numbness in the hands or feet, muscle weakness, change in vision, confusion or trouble speaking, loss of balance or coordination, trouble walking, seizures Rash, fever, and swollen lymph nodes Redness, blistering, peeling, or loosening of the skin, including inside the mouth Sudden or severe stomach pain, bloody diarrhea, fever,  nausea, vomiting Side effects that usually do not require medical attention (report to your care team if they continue or are bothersome): Bone, joint, or muscle pain Diarrhea Fatigue Loss of appetite Nausea Skin rash This list may not describe all possible side effects. Call your doctor for medical advice about side effects. You may report side effects to FDA at 1-800-FDA-1088. Where should I keep my medication? This medication is given in a hospital or clinic. It will not be stored at home. NOTE: This sheet is a summary. It may not cover all possible information. If you have questions about this medicine, talk to your doctor, pharmacist, or health care provider.  2023 Elsevier/Gold Standard (2013-03-03 00:00:00)  Oxaliplatin Injection What is this medication? OXALIPLATIN (ox AL i PLA tin) treats some types of cancer. It works by slowing down the growth of cancer cells. This medicine may be used for other purposes; ask your health care provider or pharmacist if you have questions. COMMON BRAND NAME(S): Eloxatin What should I tell my care team before I take this medication? They need to know if you have any of these conditions: Heart disease History of irregular heartbeat or rhythm Liver disease Low blood cell levels (white cells, red cells, and platelets) Lung or breathing disease, such as asthma Take medications that treat or prevent blood clots Tingling of the fingers, toes, or other nerve disorder An unusual or allergic reaction to oxaliplatin, other medications, foods, dyes, or preservatives If you or your partner are pregnant or trying to get pregnant Breast-feeding How should I use this medication? This medication is injected into a vein. It is given by your care team in a hospital or clinic setting. Talk to your care team about the use of this medication in children. Special care may be needed. Overdosage: If you think you have taken too much of this medicine contact a  poison control center or emergency room at once. NOTE: This medicine is only for you. Do not share this medicine with others. What if I miss a dose? Keep appointments for follow-up doses. It is important not to miss a dose. Call your care team if you are unable to keep an appointment. What may interact with this medication? Do not take this medication with any of the following: Cisapride Dronedarone Pimozide Thioridazine This medication may also interact with the following: Aspirin and aspirin-like medications Certain medications that treat or prevent blood clots, such as warfarin, apixaban, dabigatran, and rivaroxaban Cisplatin Cyclosporine Diuretics Medications for infection, such as acyclovir, adefovir, amphotericin B, bacitracin, cidofovir, foscarnet, ganciclovir, gentamicin, pentamidine, vancomycin NSAIDs, medications for pain and inflammation, such as ibuprofen or naproxen Other medications that cause heart rhythm changes Pamidronate Zoledronic acid This list may not describe all possible interactions. Give your health care provider a list of all the medicines, herbs, non-prescription drugs, or dietary supplements you use. Also tell them if you smoke, drink alcohol, or use illegal drugs. Some items may interact with your medicine. What should I watch for while using this medication? Your condition will be monitored carefully while you are receiving this medication. You may need blood work  while taking this medication. This medication may make you feel generally unwell. This is not uncommon as chemotherapy can affect healthy cells as well as cancer cells. Report any side effects. Continue your course of treatment even though you feel ill unless your care team tells you to stop. This medication may increase your risk of getting an infection. Call your care team for advice if you get a fever, chills, sore throat, or other symptoms of a cold or flu. Do not treat yourself. Try to avoid  being around people who are sick. Avoid taking medications that contain aspirin, acetaminophen, ibuprofen, naproxen, or ketoprofen unless instructed by your care team. These medications may hide a fever. Be careful brushing or flossing your teeth or using a toothpick because you may get an infection or bleed more easily. If you have any dental work done, tell your dentist you are receiving this medication. This medication can make you more sensitive to cold. Do not drink cold drinks or use ice. Cover exposed skin before coming in contact with cold temperatures or cold objects. When out in cold weather wear warm clothing and cover your mouth and nose to warm the air that goes into your lungs. Tell your care team if you get sensitive to the cold. Talk to your care team if you or your partner are pregnant or think either of you might be pregnant. This medication can cause serious birth defects if taken during pregnancy and for 9 months after the last dose. A negative pregnancy test is required before starting this medication. A reliable form of contraception is recommended while taking this medication and for 9 months after the last dose. Talk to your care team about effective forms of contraception. Do not father a child while taking this medication and for 6 months after the last dose. Use a condom while having sex during this time period. Do not breastfeed while taking this medication and for 3 months after the last dose. This medication may cause infertility. Talk to your care team if you are concerned about your fertility. What side effects may I notice from receiving this medication? Side effects that you should report to your care team as soon as possible: Allergic reactions--skin rash, itching, hives, swelling of the face, lips, tongue, or throat Bleeding--bloody or black, tar-like stools, vomiting blood or brown material that looks like coffee grounds, red or dark brown urine, small red or purple  spots on skin, unusual bruising or bleeding Dry cough, shortness of breath or trouble breathing Heart rhythm changes--fast or irregular heartbeat, dizziness, feeling faint or lightheaded, chest pain, trouble breathing Infection--fever, chills, cough, sore throat, wounds that don't heal, pain or trouble when passing urine, general feeling of discomfort or being unwell Liver injury--right upper belly pain, loss of appetite, nausea, light-colored stool, dark yellow or brown urine, yellowing skin or eyes, unusual weakness or fatigue Low red blood cell level--unusual weakness or fatigue, dizziness, headache, trouble breathing Muscle injury--unusual weakness or fatigue, muscle pain, dark yellow or brown urine, decrease in amount of urine Pain, tingling, or numbness in the hands or feet Sudden and severe headache, confusion, change in vision, seizures, which may be signs of posterior reversible encephalopathy syndrome (PRES) Unusual bruising or bleeding Side effects that usually do not require medical attention (report to your care team if they continue or are bothersome): Diarrhea Nausea Pain, redness, or swelling with sores inside the mouth or throat Unusual weakness or fatigue Vomiting This list may not describe all possible side  effects. Call your doctor for medical advice about side effects. You may report side effects to FDA at 1-800-FDA-1088. Where should I keep my medication? This medication is given in a hospital or clinic. It will not be stored at home. NOTE: This sheet is a summary. It may not cover all possible information. If you have questions about this medicine, talk to your doctor, pharmacist, or health care provider.  2023 Elsevier/Gold Standard (2007-08-03 00:00:00)

## 2022-08-15 ENCOUNTER — Telehealth: Payer: Self-pay

## 2022-08-15 DIAGNOSIS — Z86718 Personal history of other venous thrombosis and embolism: Secondary | ICD-10-CM | POA: Diagnosis not present

## 2022-08-15 DIAGNOSIS — I472 Ventricular tachycardia, unspecified: Secondary | ICD-10-CM | POA: Diagnosis not present

## 2022-08-15 DIAGNOSIS — D630 Anemia in neoplastic disease: Secondary | ICD-10-CM | POA: Diagnosis not present

## 2022-08-15 DIAGNOSIS — K219 Gastro-esophageal reflux disease without esophagitis: Secondary | ICD-10-CM | POA: Diagnosis not present

## 2022-08-15 DIAGNOSIS — I252 Old myocardial infarction: Secondary | ICD-10-CM | POA: Diagnosis not present

## 2022-08-15 DIAGNOSIS — J9601 Acute respiratory failure with hypoxia: Secondary | ICD-10-CM | POA: Diagnosis not present

## 2022-08-15 DIAGNOSIS — M069 Rheumatoid arthritis, unspecified: Secondary | ICD-10-CM | POA: Diagnosis not present

## 2022-08-15 DIAGNOSIS — I739 Peripheral vascular disease, unspecified: Secondary | ICD-10-CM | POA: Diagnosis not present

## 2022-08-15 DIAGNOSIS — Z8673 Personal history of transient ischemic attack (TIA), and cerebral infarction without residual deficits: Secondary | ICD-10-CM | POA: Diagnosis not present

## 2022-08-15 DIAGNOSIS — Z7902 Long term (current) use of antithrombotics/antiplatelets: Secondary | ICD-10-CM | POA: Diagnosis not present

## 2022-08-15 DIAGNOSIS — M5137 Other intervertebral disc degeneration, lumbosacral region: Secondary | ICD-10-CM | POA: Diagnosis not present

## 2022-08-15 DIAGNOSIS — Z8616 Personal history of COVID-19: Secondary | ICD-10-CM | POA: Diagnosis not present

## 2022-08-15 DIAGNOSIS — I2510 Atherosclerotic heart disease of native coronary artery without angina pectoris: Secondary | ICD-10-CM | POA: Diagnosis not present

## 2022-08-15 DIAGNOSIS — J449 Chronic obstructive pulmonary disease, unspecified: Secondary | ICD-10-CM | POA: Diagnosis not present

## 2022-08-15 DIAGNOSIS — I10 Essential (primary) hypertension: Secondary | ICD-10-CM | POA: Diagnosis not present

## 2022-08-15 DIAGNOSIS — J439 Emphysema, unspecified: Secondary | ICD-10-CM | POA: Diagnosis not present

## 2022-08-15 DIAGNOSIS — Z89612 Acquired absence of left leg above knee: Secondary | ICD-10-CM | POA: Diagnosis not present

## 2022-08-15 DIAGNOSIS — Z9181 History of falling: Secondary | ICD-10-CM | POA: Diagnosis not present

## 2022-08-15 DIAGNOSIS — Z79891 Long term (current) use of opiate analgesic: Secondary | ICD-10-CM | POA: Diagnosis not present

## 2022-08-15 DIAGNOSIS — E43 Unspecified severe protein-calorie malnutrition: Secondary | ICD-10-CM | POA: Diagnosis not present

## 2022-08-15 DIAGNOSIS — C771 Secondary and unspecified malignant neoplasm of intrathoracic lymph nodes: Secondary | ICD-10-CM | POA: Diagnosis not present

## 2022-08-15 NOTE — Telephone Encounter (Signed)
Returned call to [place], [place] with [facility]. No answer. Left message on VM requesting return call.

## 2022-08-15 NOTE — Telephone Encounter (Signed)
Colletta [place] [facility] called she is requesting verbal orders please return Stephanie's phone call @ [phone]

## 2022-08-16 ENCOUNTER — Other Ambulatory Visit: Payer: Self-pay | Admitting: Oncology

## 2022-08-16 ENCOUNTER — Ambulatory Visit (INDEPENDENT_AMBULATORY_CARE_PROVIDER_SITE_OTHER): Payer: 59 | Admitting: Student

## 2022-08-16 VITALS — BP 105/70 | HR 70 | Temp 98.1°F | Ht 67.0 in

## 2022-08-16 DIAGNOSIS — I472 Ventricular tachycardia, unspecified: Secondary | ICD-10-CM

## 2022-08-16 DIAGNOSIS — F11288 Opioid dependence with other opioid-induced disorder: Secondary | ICD-10-CM | POA: Diagnosis not present

## 2022-08-16 DIAGNOSIS — M0579 Rheumatoid arthritis with rheumatoid factor of multiple sites without organ or systems involvement: Secondary | ICD-10-CM

## 2022-08-16 DIAGNOSIS — C159 Malignant neoplasm of esophagus, unspecified: Secondary | ICD-10-CM | POA: Diagnosis not present

## 2022-08-16 DIAGNOSIS — F1190 Opioid use, unspecified, uncomplicated: Secondary | ICD-10-CM | POA: Insufficient documentation

## 2022-08-16 LAB — T4: T4, Total: 10.8 ug/dL (ref 4.5–12.0)

## 2022-08-16 MED ORDER — METHOTREXATE SODIUM 2.5 MG PO TABS: 7.5000 mg | ORAL_TABLET | ORAL | 0 refills | Status: DC

## 2022-08-16 MED ORDER — OXYCODONE-ACETAMINOPHEN 5-325 MG PO TABS: 1.0000 | ORAL_TABLET | Freq: Four times a day (QID) | ORAL | 0 refills | Status: AC | PRN

## 2022-08-16 MED ORDER — MELOXICAM 7.5 MG PO TABS: 7.5000 mg | ORAL_TABLET | Freq: Every day | ORAL | 3 refills | Status: DC | PRN

## 2022-08-16 MED ORDER — ONDANSETRON HCL 8 MG PO TABS: ORAL_TABLET | ORAL | 3 refills | Status: DC

## 2022-08-16 NOTE — Progress Notes (Signed)
CC: Hospital follow-up  HPI:  [name] is a 62 y.o. male living with a history stated below and presents today for hospital follow-up. Please see problem based assessment and plan for additional details.  Past Medical History:  Diagnosis Date   Acute respiratory failure ([place]) 01/31/2022   Bilateral shoulder pain 08/20/2012   Bite wound of left hand 09/27/2021   CAD (coronary artery disease)    Occluded nondominent RCA managed medically - August 2023   Cardiac arrest [facility])    OO[facility] VF - August 2023   Community acquired pneumonia 11/07/2011   Critical limb ischemia with history of revascularization of same extremity ([place]) 04/28/2020   DDD (degenerative disc disease), lumbosacral     and grade 2 slip   DVT (deep venous thrombosis) ([place]) 01/31/2022   Esophageal cancer (HCC)    Adenocarcinoma with metastasis   GERD (gastroesophageal reflux disease)    History of anemia    History of COVID-19 05/10/2019   History of critical lower limb ischemia 02/19/2019   Hypertension    Impingement syndrome of shoulder region    Ischemic ulcer of toe of left foot (HCC)    Liver mass    Low back pain without sciatica    Lupus ([place])    Multiple fractures of ribs, bilateral, due to CPR trauma    Onychomycosis    Paronychia of great toe    Peripheral vascular disease (HCC)    Pressure injury of skin    Pulmonary embolus (HCC)    RA (rheumatoid arthritis) ([place])    Rotator cuff tear 11/04/2014   Sternal fracture    Stroke [facility])     Current Outpatient Medications on File Prior to Visit  Medication Sig Dispense Refill   albuterol (VENTOLIN HFA) 108 (90 Base) MCG/ACT inhaler Inhale 2 puffs into the lungs every 6 (six) hours as needed for wheezing or shortness of breath. 6.7 g 2   amiodarone (PACERONE) 200 MG tablet Take 2 tablets (400 mg total) by mouth 2 (two) times daily for 6 days. Then, (starting on 2/21) take 1 tablet (200 mg total) by mouth 2 (two) times daily. 84 tablet 0    amLODipine (NORVASC) 10 MG tablet Take 1 tablet (10 mg total) by mouth daily. 30 tablet 0   apixaban (ELIQUIS) 5 MG TABS tablet Take 1 tablet (5 mg total) by mouth 2 (two) times daily. 60 tablet 0   atorvastatin (LIPITOR) 40 MG tablet Take 1 tablet (40 mg total) by mouth daily at 6 PM. 30 tablet 0   clopidogrel (PLAVIX) 75 MG tablet Take 1 tablet (75 mg total) by mouth daily. 30 tablet 0   diclofenac Sodium (VOLTAREN) 1 % GEL Apply 2 g topically 4 (four) times daily. 200 g 0   DULoxetine (CYMBALTA) 30 MG capsule Take 30 mg by mouth daily as needed (sleep).     feeding supplement (ENSURE ENLIVE / ENSURE PLUS) LIQD Take 237 mLs by mouth 3 (three) times daily between meals. (Patient not taking: Reported on 08/14/2022) 237 mL 12   ferrous sulfate 325 (65 FE) MG tablet Take 1 tablet (325 mg total) by mouth daily with breakfast for 100 doses. 123XX123 tablet 0   folic acid (FOLVITE) 1 MG tablet Take 1 tablet (1 mg total) by mouth daily. 90 tablet 3   gabapentin (NEURONTIN) 100 MG capsule Take 1 capsule (100 mg total) by mouth See admin instructions. Daily or twice daily if tolerated. (Patient taking differently: Take 100-200 mg by  mouth daily.) 90 capsule 0   isosorbide mononitrate (IMDUR) 30 MG 24 hr tablet Take 1 tablet (30 mg total) by mouth daily. 30 tablet 0   lidocaine-prilocaine (EMLA) cream Apply 1 Application topically as directed. Apply to port site 2 hours prior to stick and cover with Press-and-Seal to numb port before access 30 g 1   magnesium oxide (MAG-OX) 400 MG tablet Take 1 tablet (400 mg total) by mouth daily. 30 tablet 0   mometasone-formoterol (DULERA) 100-5 MCG/ACT AERO Inhale 2 puffs into the lungs 2 (two) times daily. 13 g 0   potassium chloride (KLOR-CON M) 10 MEQ tablet TAKE 2 TABLETS(20 MEQ) BY MOUTH DAILY 180 tablet 0   predniSONE (DELTASONE) 5 MG tablet TAKE 1 TABLET BY MOUTH EVERY DAY FOR 30 DAYS     No current facility-administered medications on file prior to visit.    Family  History  Problem Relation Age of Onset   Heart attack Father 8   Alcohol abuse Father    Aneurysm Mother    Stroke Neg Hx    Cancer Neg Hx     Social History   Socioeconomic History   Marital status: Single    Spouse name: Not on file   Number of children: 0   Years of education: Not on file   Highest education level: Not on file  Occupational History   Occupation: Disabled    Comment: 2/2 RA  Tobacco Use   Smoking status: Former    Packs/day: 0.25    Years: 20.00    Total pack years: 5.00    Types: Cigarettes    Quit date: 02/19/2019    Years since quitting: 3.4   Smokeless tobacco: Never   Tobacco comments:    Patient stated he smokes a couple day and is attempting to quit smoking  Vaping Use   Vaping Use: Never used  Substance and Sexual Activity   Alcohol use: No    Alcohol/week: 0.0 standard drinks of alcohol    Comment: sober 1998   Drug use: No   Sexual activity: Not on file  Other Topics Concern   Not on file  Social History Narrative   On disability since 1992, used to work with city of [place]. Quit drinking in 1990.      Current Social History 11/05/2019        Patient lives by himself most of the time. Sometimes girlfriend's son stays with him in a one level home. There are 3 steps with handrail up to the entrance the patient uses.       Patient's method of transportation is personal truck. This was recently vandalized and patient doesn't have funds to repair at this time.      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, BSN,  RN-BC    Social Determinants of Health   Financial Resource Strain: Low Risk  (05/05/2022)   Overall Emergency planning/management officer  Strain (CARDIA)    Difficulty of Paying Living Expenses: Not hard at all  Food Insecurity: No Food Insecurity (08/10/2022)   Hunger Vital Sign    Worried About Running Out of Food in the Last Year: Never true    Ran Out of Food in the Last Year: Never true  Transportation Needs: Unmet Transportation Needs (08/05/2022)   PRAPARE - Hydrologist (Medical): Yes    Lack of Transportation (Non-Medical): Yes  Physical Activity: Not on file  Stress: Not on file  Social Connections: Not on file  Intimate Partner Violence: Not At Risk (08/05/2022)   Humiliation, Afraid, Rape, and Kick questionnaire    Fear of Current or Ex-Partner: No    Emotionally Abused: No    Physically Abused: No    Sexually Abused: No    Review of Systems: ROS negative except for what is noted on the assessment and plan.  Vitals:   08/16/22 1419  BP: 105/70  Pulse: 70  Temp: 98.1 F (36.7 C)  TempSrc: Oral  SpO2: 97%  Height: 5' 7"$  (1.702 m)    Physical Exam: Constitutional: Appears older than stated age male in no acute distress  HENT: normocephalic atraumatic, mucous membranes moist Eyes: conjunctiva non-erythematous Neck: supple Cardiovascular: regular rate and rhythm, no m/r/g Pulmonary/Chest: normal work of breathing on room air, lungs clear to auscultation bilaterally Abdominal: soft, non-tender, non-distended MSK: normal bulk and tone, left BKA, swan-neck deformities in both hands, tender to palpation  Assessment & Plan:   Ventricular tachycardia [facility]) Patient presents as a hospital follow-up for sustained V. tach.  Was likely secondary to decreased potassium and severely decreased magnesium.  He has had good follow-up with cardiology, and his next appointment is on March 5.  He is compliant with his amiodarone 200 mg twice daily, transition from 400  mg yesterday.  He denies any symptoms of chest pain or palpitations or bradycardia or syncopal episodes.  Plan: - Will repeat BMP and magnesium to ensure resolution - If potassium is low or magnesium is low we will send supplementation  Rheumatoid arthritis ([place]) Patient has prolonged history of rheumatoid arthritis.  He is on methotrexate 7.5 mg once a week, and meloxicam as needed.  He was referred to rheumatology, however has not seen them.  I provided patient the number for them to give them a phone call.  He may need titration of his methotrexate, as on physical exam he has swan sign and is continuing to have severe pain.   Plan: - Will refill methotrexate until patient can see rheumatologist - Will refill meloxicam as well  Esophageal cancer, stage IV ([place]) Patient has esophageal cancer stage IV, and currently follows up with oncology and recently had an appointment with them. Patient does have some nausea from chemotherapy treatments, will refill Zofran.   Opioid use  Due to his cancer as well as his chronic pain syndrome he has been using Percocet 5 mg for pain control.  In the hospital, he was just using oxycodone 10 mg.  PDMP seems appropriate. After patient was hospitalized he was given 2-week supply of 10 mg of oxycodone.  Patient did have pain contract signed and September 2023, and is agreeable to do a tox assure today for further compliance with prescription opioids.   Plan: - Will check tox assure today - Will give month supply of Percocet 5 mg-300  Patient discussed with Dr. Hayden Rasmussen Tayia Stonesifer, M.D. [facility], PGY-1 Phone:  [phone] Date 08/16/2022 Time 4:50 PM

## 2022-08-16 NOTE — Assessment & Plan Note (Signed)
Due to his cancer as well as his chronic pain syndrome he has been using Percocet 5 mg for pain control.  In the hospital, he was just using oxycodone 10 mg.  PDMP seems appropriate. After patient was hospitalized he was given 2-week supply of 10 mg of oxycodone.  Patient did have pain contract signed and September 2023, and is agreeable to do a tox assure today for further compliance with prescription opioids.   Plan: - Will check tox assure today - Will give month supply of Percocet 5 mg-300

## 2022-08-16 NOTE — Assessment & Plan Note (Signed)
Patient has prolonged history of rheumatoid arthritis.  He is on methotrexate 7.5 mg once a week, and meloxicam as needed.  He was referred to rheumatology, however has not seen them.  I provided patient the number for them to give them a phone call.  He may need titration of his methotrexate, as on physical exam he has swan sign and is continuing to have severe pain.   Plan: - Will refill methotrexate until patient can see rheumatologist - Will refill meloxicam as well

## 2022-08-16 NOTE — Assessment & Plan Note (Signed)
Patient presents as a hospital follow-up for sustained V. tach.  Was likely secondary to decreased potassium and severely decreased magnesium.  He has had good follow-up with cardiology, and his next appointment is on March 5.  He is compliant with his amiodarone 200 mg twice daily, transition from 400 mg yesterday.  He denies any symptoms of chest pain or palpitations or bradycardia or syncopal episodes.  Plan: - Will repeat BMP and magnesium to ensure resolution - If potassium is low or magnesium is low we will send supplementation

## 2022-08-16 NOTE — Assessment & Plan Note (Addendum)
Patient has esophageal cancer stage IV, and currently follows up with oncology and recently had an appointment with them. Patient does have some nausea from chemotherapy treatments, will refill Zofran.

## 2022-08-16 NOTE — Patient Instructions (Signed)
Thank you so much for coming to the clinic today!   It was a pleasure meeting you. I have sent a month supply of percocet to your pharmacy, as well as the refills. The number for the rheumatologist is [phone], and it is called [facility]. The referral should already be in. We are also checking some labs today, I will call you if they are abnormal.   If you have any questions please feel free to the call the clinic at anytime at [phone]. It was a pleasure seeing you!  Best, Dr. Sanjuana Mae

## 2022-08-17 ENCOUNTER — Other Ambulatory Visit (HOSPITAL_COMMUNITY): Payer: Self-pay

## 2022-08-17 ENCOUNTER — Telehealth: Payer: Self-pay

## 2022-08-17 DIAGNOSIS — D630 Anemia in neoplastic disease: Secondary | ICD-10-CM | POA: Diagnosis not present

## 2022-08-17 DIAGNOSIS — J449 Chronic obstructive pulmonary disease, unspecified: Secondary | ICD-10-CM | POA: Diagnosis not present

## 2022-08-17 DIAGNOSIS — C771 Secondary and unspecified malignant neoplasm of intrathoracic lymph nodes: Secondary | ICD-10-CM | POA: Diagnosis not present

## 2022-08-17 DIAGNOSIS — I2510 Atherosclerotic heart disease of native coronary artery without angina pectoris: Secondary | ICD-10-CM | POA: Diagnosis not present

## 2022-08-17 DIAGNOSIS — M069 Rheumatoid arthritis, unspecified: Secondary | ICD-10-CM | POA: Diagnosis not present

## 2022-08-17 DIAGNOSIS — M5137 Other intervertebral disc degeneration, lumbosacral region: Secondary | ICD-10-CM | POA: Diagnosis not present

## 2022-08-17 DIAGNOSIS — I472 Ventricular tachycardia, unspecified: Secondary | ICD-10-CM | POA: Diagnosis not present

## 2022-08-17 DIAGNOSIS — Z79891 Long term (current) use of opiate analgesic: Secondary | ICD-10-CM | POA: Diagnosis not present

## 2022-08-17 DIAGNOSIS — J9601 Acute respiratory failure with hypoxia: Secondary | ICD-10-CM | POA: Diagnosis not present

## 2022-08-17 DIAGNOSIS — Z8673 Personal history of transient ischemic attack (TIA), and cerebral infarction without residual deficits: Secondary | ICD-10-CM | POA: Diagnosis not present

## 2022-08-17 DIAGNOSIS — J439 Emphysema, unspecified: Secondary | ICD-10-CM | POA: Diagnosis not present

## 2022-08-17 DIAGNOSIS — Z89612 Acquired absence of left leg above knee: Secondary | ICD-10-CM | POA: Diagnosis not present

## 2022-08-17 DIAGNOSIS — E43 Unspecified severe protein-calorie malnutrition: Secondary | ICD-10-CM | POA: Diagnosis not present

## 2022-08-17 DIAGNOSIS — Z86718 Personal history of other venous thrombosis and embolism: Secondary | ICD-10-CM | POA: Diagnosis not present

## 2022-08-17 DIAGNOSIS — I739 Peripheral vascular disease, unspecified: Secondary | ICD-10-CM | POA: Diagnosis not present

## 2022-08-17 DIAGNOSIS — I10 Essential (primary) hypertension: Secondary | ICD-10-CM | POA: Diagnosis not present

## 2022-08-17 DIAGNOSIS — Z9181 History of falling: Secondary | ICD-10-CM | POA: Diagnosis not present

## 2022-08-17 DIAGNOSIS — I252 Old myocardial infarction: Secondary | ICD-10-CM | POA: Diagnosis not present

## 2022-08-17 DIAGNOSIS — Z8616 Personal history of COVID-19: Secondary | ICD-10-CM | POA: Diagnosis not present

## 2022-08-17 DIAGNOSIS — K219 Gastro-esophageal reflux disease without esophagitis: Secondary | ICD-10-CM | POA: Diagnosis not present

## 2022-08-17 DIAGNOSIS — Z7902 Long term (current) use of antithrombotics/antiplatelets: Secondary | ICD-10-CM | POA: Diagnosis not present

## 2022-08-17 NOTE — Telephone Encounter (Signed)
Return call to [facility] PT - no answer; left message our office is returning her phone call.

## 2022-08-17 NOTE — Addendum Note (Signed)
Addended by: Charise Killian on: 08/17/2022 11:08 AM   Modules accepted: Level of Service

## 2022-08-17 NOTE — Telephone Encounter (Signed)
Tillie Rung from [facility] called she stated patients blopd pressure was 94/64 today, patient had taken metoprolol and Amiodarone together patient didn't realize he was taking a discontinued medication. Tillie Rung put away the medicine so the patient does not take it again.

## 2022-08-17 NOTE — Progress Notes (Signed)
Internal Medicine Clinic Attending  Case discussed with Dr. Sanjuana Mae  At the time of the visit.  We reviewed the resident's history and exam and pertinent patient test results.  I agree with the assessment, diagnosis, and plan of care documented in the resident's note.

## 2022-08-17 NOTE — Telephone Encounter (Signed)
Returned Kendra's call for any additional information - no answer; left message I had called her back.

## 2022-08-17 NOTE — Telephone Encounter (Signed)
Kindra  from [facility] is requesting a call back she is wanting to give a report on pt ...[phone]

## 2022-08-18 ENCOUNTER — Encounter: Payer: Self-pay | Admitting: Oncology

## 2022-08-18 LAB — MAGNESIUM: Magnesium: 1.9 mg/dL (ref 1.6–2.3)

## 2022-08-18 LAB — BMP8+ANION GAP
Anion Gap: 15 mmol/L (ref 10.0–18.0)
BUN/Creatinine Ratio: 24 (ref 10–24)
BUN: 21 mg/dL (ref 8–27)
CO2: 20 mmol/L (ref 20–29)
Calcium: 9.1 mg/dL (ref 8.6–10.2)
Chloride: 100 mmol/L (ref 96–106)
Creatinine, Ser: 0.86 mg/dL (ref 0.76–1.27)
Glucose: 79 mg/dL (ref 70–99)
Potassium: 4.4 mmol/L (ref 3.5–5.2)
Sodium: 135 mmol/L (ref 134–144)
eGFR: 98 mL/min/{1.73_m2} (ref >59)

## 2022-08-19 LAB — TOXASSURE SELECT,+ANTIDEPR,UR

## 2022-08-23 ENCOUNTER — Ambulatory Visit: Payer: 59 | Admitting: Nutrition

## 2022-08-23 DIAGNOSIS — M5137 Other intervertebral disc degeneration, lumbosacral region: Secondary | ICD-10-CM | POA: Diagnosis not present

## 2022-08-23 DIAGNOSIS — I2510 Atherosclerotic heart disease of native coronary artery without angina pectoris: Secondary | ICD-10-CM | POA: Diagnosis not present

## 2022-08-23 DIAGNOSIS — Z79891 Long term (current) use of opiate analgesic: Secondary | ICD-10-CM | POA: Diagnosis not present

## 2022-08-23 DIAGNOSIS — I472 Ventricular tachycardia, unspecified: Secondary | ICD-10-CM | POA: Diagnosis not present

## 2022-08-23 DIAGNOSIS — I10 Essential (primary) hypertension: Secondary | ICD-10-CM | POA: Diagnosis not present

## 2022-08-23 DIAGNOSIS — J439 Emphysema, unspecified: Secondary | ICD-10-CM | POA: Diagnosis not present

## 2022-08-23 DIAGNOSIS — J9601 Acute respiratory failure with hypoxia: Secondary | ICD-10-CM | POA: Diagnosis not present

## 2022-08-23 DIAGNOSIS — Z9181 History of falling: Secondary | ICD-10-CM | POA: Diagnosis not present

## 2022-08-23 DIAGNOSIS — Z89612 Acquired absence of left leg above knee: Secondary | ICD-10-CM | POA: Diagnosis not present

## 2022-08-23 DIAGNOSIS — Z8616 Personal history of COVID-19: Secondary | ICD-10-CM | POA: Diagnosis not present

## 2022-08-23 DIAGNOSIS — M069 Rheumatoid arthritis, unspecified: Secondary | ICD-10-CM | POA: Diagnosis not present

## 2022-08-23 DIAGNOSIS — D630 Anemia in neoplastic disease: Secondary | ICD-10-CM | POA: Diagnosis not present

## 2022-08-23 DIAGNOSIS — Z86718 Personal history of other venous thrombosis and embolism: Secondary | ICD-10-CM | POA: Diagnosis not present

## 2022-08-23 DIAGNOSIS — Z8673 Personal history of transient ischemic attack (TIA), and cerebral infarction without residual deficits: Secondary | ICD-10-CM | POA: Diagnosis not present

## 2022-08-23 DIAGNOSIS — Z7902 Long term (current) use of antithrombotics/antiplatelets: Secondary | ICD-10-CM | POA: Diagnosis not present

## 2022-08-23 DIAGNOSIS — I252 Old myocardial infarction: Secondary | ICD-10-CM | POA: Diagnosis not present

## 2022-08-23 DIAGNOSIS — J449 Chronic obstructive pulmonary disease, unspecified: Secondary | ICD-10-CM | POA: Diagnosis not present

## 2022-08-23 DIAGNOSIS — I739 Peripheral vascular disease, unspecified: Secondary | ICD-10-CM | POA: Diagnosis not present

## 2022-08-23 DIAGNOSIS — C771 Secondary and unspecified malignant neoplasm of intrathoracic lymph nodes: Secondary | ICD-10-CM | POA: Diagnosis not present

## 2022-08-23 DIAGNOSIS — K219 Gastro-esophageal reflux disease without esophagitis: Secondary | ICD-10-CM | POA: Diagnosis not present

## 2022-08-23 DIAGNOSIS — E43 Unspecified severe protein-calorie malnutrition: Secondary | ICD-10-CM | POA: Diagnosis not present

## 2022-08-23 NOTE — Progress Notes (Signed)
Contacted patient at mobile number for nutrition follow up. Patient was not available. Left name and phone number for return call.

## 2022-08-24 DIAGNOSIS — C771 Secondary and unspecified malignant neoplasm of intrathoracic lymph nodes: Secondary | ICD-10-CM | POA: Diagnosis not present

## 2022-08-24 DIAGNOSIS — E43 Unspecified severe protein-calorie malnutrition: Secondary | ICD-10-CM | POA: Diagnosis not present

## 2022-08-24 DIAGNOSIS — I739 Peripheral vascular disease, unspecified: Secondary | ICD-10-CM | POA: Diagnosis not present

## 2022-08-24 DIAGNOSIS — J9601 Acute respiratory failure with hypoxia: Secondary | ICD-10-CM | POA: Diagnosis not present

## 2022-08-24 DIAGNOSIS — Z8673 Personal history of transient ischemic attack (TIA), and cerebral infarction without residual deficits: Secondary | ICD-10-CM | POA: Diagnosis not present

## 2022-08-24 DIAGNOSIS — I10 Essential (primary) hypertension: Secondary | ICD-10-CM | POA: Diagnosis not present

## 2022-08-24 DIAGNOSIS — M069 Rheumatoid arthritis, unspecified: Secondary | ICD-10-CM | POA: Diagnosis not present

## 2022-08-24 DIAGNOSIS — D630 Anemia in neoplastic disease: Secondary | ICD-10-CM | POA: Diagnosis not present

## 2022-08-24 DIAGNOSIS — J439 Emphysema, unspecified: Secondary | ICD-10-CM | POA: Diagnosis not present

## 2022-08-24 DIAGNOSIS — M5137 Other intervertebral disc degeneration, lumbosacral region: Secondary | ICD-10-CM | POA: Diagnosis not present

## 2022-08-24 DIAGNOSIS — Z89612 Acquired absence of left leg above knee: Secondary | ICD-10-CM | POA: Diagnosis not present

## 2022-08-24 DIAGNOSIS — Z7902 Long term (current) use of antithrombotics/antiplatelets: Secondary | ICD-10-CM | POA: Diagnosis not present

## 2022-08-24 DIAGNOSIS — Z86718 Personal history of other venous thrombosis and embolism: Secondary | ICD-10-CM | POA: Diagnosis not present

## 2022-08-24 DIAGNOSIS — Z8616 Personal history of COVID-19: Secondary | ICD-10-CM | POA: Diagnosis not present

## 2022-08-24 DIAGNOSIS — J449 Chronic obstructive pulmonary disease, unspecified: Secondary | ICD-10-CM | POA: Diagnosis not present

## 2022-08-24 DIAGNOSIS — K219 Gastro-esophageal reflux disease without esophagitis: Secondary | ICD-10-CM | POA: Diagnosis not present

## 2022-08-24 DIAGNOSIS — I472 Ventricular tachycardia, unspecified: Secondary | ICD-10-CM | POA: Diagnosis not present

## 2022-08-24 DIAGNOSIS — I252 Old myocardial infarction: Secondary | ICD-10-CM | POA: Diagnosis not present

## 2022-08-24 DIAGNOSIS — I2510 Atherosclerotic heart disease of native coronary artery without angina pectoris: Secondary | ICD-10-CM | POA: Diagnosis not present

## 2022-08-24 DIAGNOSIS — Z79891 Long term (current) use of opiate analgesic: Secondary | ICD-10-CM | POA: Diagnosis not present

## 2022-08-24 DIAGNOSIS — Z9181 History of falling: Secondary | ICD-10-CM | POA: Diagnosis not present

## 2022-08-25 DIAGNOSIS — D630 Anemia in neoplastic disease: Secondary | ICD-10-CM | POA: Diagnosis not present

## 2022-08-25 DIAGNOSIS — Z7902 Long term (current) use of antithrombotics/antiplatelets: Secondary | ICD-10-CM | POA: Diagnosis not present

## 2022-08-25 DIAGNOSIS — I252 Old myocardial infarction: Secondary | ICD-10-CM | POA: Diagnosis not present

## 2022-08-25 DIAGNOSIS — I2510 Atherosclerotic heart disease of native coronary artery without angina pectoris: Secondary | ICD-10-CM | POA: Diagnosis not present

## 2022-08-25 DIAGNOSIS — E43 Unspecified severe protein-calorie malnutrition: Secondary | ICD-10-CM | POA: Diagnosis not present

## 2022-08-25 DIAGNOSIS — I10 Essential (primary) hypertension: Secondary | ICD-10-CM | POA: Diagnosis not present

## 2022-08-25 DIAGNOSIS — C771 Secondary and unspecified malignant neoplasm of intrathoracic lymph nodes: Secondary | ICD-10-CM | POA: Diagnosis not present

## 2022-08-25 DIAGNOSIS — Z79891 Long term (current) use of opiate analgesic: Secondary | ICD-10-CM | POA: Diagnosis not present

## 2022-08-25 DIAGNOSIS — M5137 Other intervertebral disc degeneration, lumbosacral region: Secondary | ICD-10-CM | POA: Diagnosis not present

## 2022-08-25 DIAGNOSIS — K219 Gastro-esophageal reflux disease without esophagitis: Secondary | ICD-10-CM | POA: Diagnosis not present

## 2022-08-25 DIAGNOSIS — J9601 Acute respiratory failure with hypoxia: Secondary | ICD-10-CM | POA: Diagnosis not present

## 2022-08-25 DIAGNOSIS — J449 Chronic obstructive pulmonary disease, unspecified: Secondary | ICD-10-CM | POA: Diagnosis not present

## 2022-08-25 DIAGNOSIS — Z86718 Personal history of other venous thrombosis and embolism: Secondary | ICD-10-CM | POA: Diagnosis not present

## 2022-08-25 DIAGNOSIS — J439 Emphysema, unspecified: Secondary | ICD-10-CM | POA: Diagnosis not present

## 2022-08-25 DIAGNOSIS — Z8673 Personal history of transient ischemic attack (TIA), and cerebral infarction without residual deficits: Secondary | ICD-10-CM | POA: Diagnosis not present

## 2022-08-25 DIAGNOSIS — Z89612 Acquired absence of left leg above knee: Secondary | ICD-10-CM | POA: Diagnosis not present

## 2022-08-25 DIAGNOSIS — M069 Rheumatoid arthritis, unspecified: Secondary | ICD-10-CM | POA: Diagnosis not present

## 2022-08-25 DIAGNOSIS — Z9181 History of falling: Secondary | ICD-10-CM | POA: Diagnosis not present

## 2022-08-25 DIAGNOSIS — I472 Ventricular tachycardia, unspecified: Secondary | ICD-10-CM | POA: Diagnosis not present

## 2022-08-25 DIAGNOSIS — I739 Peripheral vascular disease, unspecified: Secondary | ICD-10-CM | POA: Diagnosis not present

## 2022-08-25 DIAGNOSIS — Z8616 Personal history of COVID-19: Secondary | ICD-10-CM | POA: Diagnosis not present

## 2022-08-28 NOTE — Progress Notes (Unsigned)
  Cardiology Office Note:   Date:  08/29/2022  ID:  [name] [name], DOB 03/08/1961, MRN [id number]  Primary Cardiologist: None Electrophysiologist: Cristopher Peru, MD   History of Present Illness:   [name] [name] is a 62 y.o. male with a history of CAD, h/o DVT/PE, Metastatic adenocarcinoma, s/p left AKA, Chronic anemia, and h/o VT arrest in setting of STEMI seen today for post hospital follow up.    Admitted 2/9 - 2/14 for dizziness and found to have VT. K and Mg both noted to be low on admission (K 2.9, Mg 1.4).  Picture complicated by his metastatic adenocarcinoma of the esophagus.   EP asked to see in consult. Loaded on amiodarone, pt not ICD candidate given port.   Since discharge from hospital the patient reports doing well. He had mild lightheadedness this am with transferring, but otherwise doing well. No chest pain, palpitations, or syncope. He has 2 additional treatments for his cancer, and then will undergo scans to see how well the treatment worked.   Review of systems complete and found to be negative unless listed in HPI.    Studies Reviewed:    EKG is ordered today. Personal review shows NSR at 86 bpm with multifocal PVCs  Risk Assessment/Calculations:      Physical Exam:   VS:  BP 98/60   Pulse 80   Resp (!) 96   Ht '5\' 7"'$  (1.702 m)   Wt 142 lb (64.4 kg)   BMI 22.24 kg/m    Wt Readings from Last 3 Encounters:  08/29/22 142 lb (64.4 kg)  08/14/22 139 lb (63 kg)  08/05/22 138 lb 14.2 oz (63 kg)     GEN: Chronically ill appearing. No acute distress NECK: No JVD; No carotid bruits CARDIAC: RRR with occasional ectopy, no murmurs, rubs, gallops RESPIRATORY:  Clear to auscultation without rales, wheezing or rhonchi  ABDOMEN: Soft, non-tender, non-distended EXTREMITIES:  No edema; No deformity. L AKA  ASSESSMENT AND PLAN:   Ventricular tachycardia, Monomorphic Previously occurred in setting of STEMI with recent recurrence.  Not currently ICD candidate with R chest  port, In addition had potentially reversible cause in setting of severe electrolyte dysfunction Continue amiodarone 200 mg BID. Will continue this for now in discussion with Dr. Lovena Le. He would potentially be an ICD candidate in the future pending the course of his adenocarcinoma and if he were to have further VT without reversible cause.   Chronic systolic CHF EF [id number].  Has gen cards follow up later this month.  Would gradually add GDMT as tolerated. Pressures too soft to add anything today.   Esophageal Adenocarcinoma By his report, he has 2 additional chemo sessions and then further imaging.    Follow up with EP APP in 4 weeks to check amio labs and consider gentle GDMT addition/titration.   Signed, Shirley Friar, PA-C

## 2022-08-29 ENCOUNTER — Ambulatory Visit: Payer: 59 | Attending: Student | Admitting: Student

## 2022-08-29 ENCOUNTER — Encounter: Payer: Self-pay | Admitting: Student

## 2022-08-29 VITALS — BP 98/60 | HR 80 | Ht 67.0 in | Wt 142.0 lb

## 2022-08-29 DIAGNOSIS — I5022 Chronic systolic (congestive) heart failure: Secondary | ICD-10-CM

## 2022-08-29 DIAGNOSIS — C159 Malignant neoplasm of esophagus, unspecified: Secondary | ICD-10-CM | POA: Diagnosis not present

## 2022-08-29 DIAGNOSIS — I472 Ventricular tachycardia, unspecified: Secondary | ICD-10-CM

## 2022-08-29 NOTE — Patient Instructions (Signed)
Medication Instructions:  Your physician recommends that you continue on your current medications as directed. Please refer to the Current Medication list given to you today.  *If you need a refill on your cardiac medications before your next appointment, please call your pharmacy*   Lab Work: None If you have labs (blood work) drawn today and your tests are completely normal, you will receive your results only by: [place] (if you have MyChart) OR A paper copy in the mail If you have any lab test that is abnormal or we need to change your treatment, we will call you to review the results.   Follow-Up: At [facility], you and your health needs are our priority.  As part of our continuing mission to provide you with exceptional heart care, we have created designated Provider Care Teams.  These Care Teams include your primary Cardiologist (physician) and Advanced Practice Providers (APPs -  Physician Assistants and Nurse Practitioners) who all work together to provide you with the care you need, when you need it.  We recommend signing up for the patient portal called "MyChart".  Sign up information is provided on this After Visit Summary.  MyChart is used to connect with patients for Virtual Visits (Telemedicine).  Patients are able to view lab/test results, encounter notes, upcoming appointments, etc.  Non-urgent messages can be sent to your provider as well.   To learn more about what you can do with MyChart, go to [email or web address].    Your next appointment:   1 month(s)  Provider:   Legrand Como "Oda Kilts, PA-C

## 2022-08-30 ENCOUNTER — Inpatient Hospital Stay: Payer: 59 | Attending: Oncology

## 2022-08-30 ENCOUNTER — Inpatient Hospital Stay: Payer: 59

## 2022-08-30 ENCOUNTER — Inpatient Hospital Stay (HOSPITAL_BASED_OUTPATIENT_CLINIC_OR_DEPARTMENT_OTHER): Payer: 59 | Admitting: Oncology

## 2022-08-30 ENCOUNTER — Inpatient Hospital Stay: Payer: 59 | Admitting: Nutrition

## 2022-08-30 ENCOUNTER — Other Ambulatory Visit: Payer: Self-pay

## 2022-08-30 VITALS — BP 102/78 | HR 73 | Temp 98.2°F | Resp 18 | Ht 67.0 in | Wt 142.0 lb

## 2022-08-30 DIAGNOSIS — R590 Localized enlarged lymph nodes: Secondary | ICD-10-CM | POA: Insufficient documentation

## 2022-08-30 DIAGNOSIS — Z5111 Encounter for antineoplastic chemotherapy: Secondary | ICD-10-CM | POA: Insufficient documentation

## 2022-08-30 DIAGNOSIS — Z95828 Presence of other vascular implants and grafts: Secondary | ICD-10-CM | POA: Diagnosis not present

## 2022-08-30 DIAGNOSIS — Z86718 Personal history of other venous thrombosis and embolism: Secondary | ICD-10-CM | POA: Diagnosis not present

## 2022-08-30 DIAGNOSIS — C159 Malignant neoplasm of esophagus, unspecified: Secondary | ICD-10-CM

## 2022-08-30 DIAGNOSIS — R970 Elevated carcinoembryonic antigen [CEA]: Secondary | ICD-10-CM | POA: Diagnosis not present

## 2022-08-30 DIAGNOSIS — D509 Iron deficiency anemia, unspecified: Secondary | ICD-10-CM | POA: Insufficient documentation

## 2022-08-30 DIAGNOSIS — Z7901 Long term (current) use of anticoagulants: Secondary | ICD-10-CM | POA: Diagnosis not present

## 2022-08-30 DIAGNOSIS — I252 Old myocardial infarction: Secondary | ICD-10-CM | POA: Insufficient documentation

## 2022-08-30 DIAGNOSIS — J449 Chronic obstructive pulmonary disease, unspecified: Secondary | ICD-10-CM | POA: Insufficient documentation

## 2022-08-30 DIAGNOSIS — C787 Secondary malignant neoplasm of liver and intrahepatic bile duct: Secondary | ICD-10-CM

## 2022-08-30 DIAGNOSIS — C801 Malignant (primary) neoplasm, unspecified: Secondary | ICD-10-CM | POA: Diagnosis not present

## 2022-08-30 DIAGNOSIS — Z86711 Personal history of pulmonary embolism: Secondary | ICD-10-CM | POA: Insufficient documentation

## 2022-08-30 DIAGNOSIS — I2699 Other pulmonary embolism without acute cor pulmonale: Secondary | ICD-10-CM | POA: Diagnosis not present

## 2022-08-30 LAB — CMP (CANCER CENTER ONLY)
ALT: 82 U/L — ABNORMAL HIGH (ref 0–44)
AST: 71 U/L — ABNORMAL HIGH (ref 15–41)
Albumin: 3.6 g/dL (ref 3.5–5.0)
Alkaline Phosphatase: 206 U/L — ABNORMAL HIGH (ref 38–126)
Anion gap: 7 (ref 5–15)
BUN: 16 mg/dL (ref 8–23)
CO2: 26 mmol/L (ref 22–32)
Calcium: 9.5 mg/dL (ref 8.9–10.3)
Chloride: 105 mmol/L (ref 98–111)
Creatinine: 0.72 mg/dL (ref 0.61–1.24)
GFR, Estimated: >60 mL/min (ref >60)
Glucose, Bld: 104 mg/dL — ABNORMAL HIGH (ref 70–99)
Potassium: 3.9 mmol/L (ref 3.5–5.1)
Sodium: 138 mmol/L (ref 135–145)
Total Bilirubin: 0.3 mg/dL (ref 0.3–1.2)
Total Protein: 6.9 g/dL (ref 6.5–8.1)

## 2022-08-30 LAB — CBC WITH DIFFERENTIAL (CANCER CENTER ONLY)
Abs Immature Granulocytes: 0.01 10*3/uL (ref 0.00–0.07)
Basophils Absolute: 0 10*3/uL (ref 0.0–0.1)
Basophils Relative: 1 %
Eosinophils Absolute: 0.3 10*3/uL (ref 0.0–0.5)
Eosinophils Relative: 5 %
HCT: 34.8 % — ABNORMAL LOW (ref 39.0–52.0)
Hemoglobin: 11.2 g/dL — ABNORMAL LOW (ref 13.0–17.0)
Immature Granulocytes: 0 %
Lymphocytes Relative: 19 %
Lymphs Abs: 1 10*3/uL (ref 0.7–4.0)
MCH: 29 pg (ref 26.0–34.0)
MCHC: 32.2 g/dL (ref 30.0–36.0)
MCV: 90.2 fL (ref 80.0–100.0)
Monocytes Absolute: 0.6 10*3/uL (ref 0.1–1.0)
Monocytes Relative: 10 %
Neutro Abs: 3.5 10*3/uL (ref 1.7–7.7)
Neutrophils Relative %: 65 %
Platelet Count: 101 10*3/uL — ABNORMAL LOW (ref 150–400)
RBC: 3.86 MIL/uL — ABNORMAL LOW (ref 4.22–5.81)
RDW: 19.6 % — ABNORMAL HIGH (ref 11.5–15.5)
WBC Count: 5.4 10*3/uL (ref 4.0–10.5)
nRBC: 0 % (ref 0.0–0.2)

## 2022-08-30 LAB — CEA (ACCESS): CEA (CHCC): 512.83 ng/mL — ABNORMAL HIGH (ref 0.00–5.00)

## 2022-08-30 MED ORDER — SODIUM CHLORIDE 0.9% FLUSH
10.0000 mL | INTRAVENOUS | Status: DC | PRN
  Administered 2022-08-30: 10 mL via INTRAVENOUS

## 2022-08-30 MED ORDER — HEPARIN SOD (PORK) LOCK FLUSH 100 UNIT/ML IV SOLN
500.0000 [IU] | Freq: Once | INTRAVENOUS | Status: AC
  Administered 2022-08-30: 500 [IU] via INTRAVENOUS

## 2022-08-30 NOTE — Progress Notes (Signed)
Brief nutrition follow up completed with patient after appointment with MD. Treatment is being held today.  Weight improved and documented as 142 pounds. This is increased from 137 pounds 5 oz on Jan 22.  Reports appetite is good and he is not experiencing nausea, vomiting, Diarrhea or Constipation. He says he is out of ensure but normally drinks one to two bottles daily. Michela Pitcher he had to use some of his money to buy groceries.  Nutrition Diagnosis: Food and Nutrition Related Knowledge Deficit improved.  Intervention: Continue strategies to minimize loss of lean body mass. Provided one complimentary case of Ensure Plus HP. Provided a food bag from the food pantry.  Monitoring, Evaluation, Goals: Tolerated adequate calories and protein to maintain lean body mass.  No follow up scheduled at this time.

## 2022-08-30 NOTE — Patient Instructions (Signed)

## 2022-08-30 NOTE — Progress Notes (Signed)
[name] OFFICE PROGRESS NOTE   Diagnosis: Metastatic adenocarcinoma  INTERVAL HISTORY:   [name] [name] returns as scheduled.  He completed another treatment with oxaliplatin and embolism Mab on 08/14/2022.  He reports feeling well.  No chest pain.  He has persistent numbness in the hands.  He has difficulty holding cold objects.  Objective:  Vital signs in last 24 hours:  Blood pressure 102/78, pulse 73, temperature 98.2 F (36.8 C), temperature source Oral, resp. rate 18, height '5\' 7"'$  (1.702 m), weight 142 lb (64.4 kg), SpO2 98 %.    HEENT: No thrush or ulcers Lymph nodes: No cervical, supraclavicular, axillary, or inguinal nodes Resp: Lungs clear bilaterally Cardio: Regular rate and rhythm GI: No hepatosplenomegaly, nontender Vascular: No right leg edema Neuro: Moderate loss of vibratory sense at the fingertips bilaterally   Portacath/PICC-without erythema  Lab Results:  Lab Results  Component Value Date   WBC 5.4 08/30/2022   HGB 11.2 (L) 08/30/2022   HCT 34.8 (L) 08/30/2022   MCV 90.2 08/30/2022   PLT 101 (L) 08/30/2022   NEUTROABS 3.5 08/30/2022    CMP  Lab Results  Component Value Date   NA 135 08/16/2022   K 4.4 08/16/2022   CL 100 08/16/2022   CO2 20 08/16/2022   GLUCOSE 79 08/16/2022   BUN 21 08/16/2022   CREATININE 0.86 08/16/2022   CALCIUM 9.1 08/16/2022   PROT 7.0 08/14/2022   ALBUMIN 3.7 08/14/2022   AST 67 (H) 08/14/2022   ALT 66 (H) 08/14/2022   ALKPHOS 204 (H) 08/14/2022   BILITOT 0.4 08/14/2022   GFRNONAA >60 08/14/2022   GFRAA >60 01/24/2020    Lab Results  Component Value Date   CEA1 287.0 (H) 02/11/2022   CEA 512.83 (H) 08/30/2022   [id number] 88 (H) 02/11/2022    Medications: I have reviewed the patient's current medications.   Assessment/Plan: 1.Metastatic adenocarcinoma, likely esophagus primary - Liver mass, mass or lymph node conglomerate near the GE junction, gastrohepatic ligament and retroperitoneal  lymphadenopathy. -02/11/2022 CTA chest-7.8 x 8.5 cm subtle hypoattenuating mass lesion in segment IV of the liver. -02/11/2022 CT abdomen/pelvis-8.7 x 7.8 or ill-defined irregular mass in the medial segment left liver, 4.5 x 3.4 cm necrotic mass lesion just cranial to the esophagogastric junction with metastatic lymphadenopathy in the abdomen and pelvis, irregular nodular peritoneal thickening in the lower abdomen and pelvis -02/11/2022 MRI abdomen-rim-enhancing likely necrotic mass or lymph node conglomerate centered around the gastroesophageal junction measuring 5.0 x 3.9 cm, large rim-enhancing likely necrotic liver mass centered in the anterior left lobe of the liver measuring 7.8 x 7.2 cm, enlarged gastrohepatic ligament and retroperitoneal lymph nodes -Labs from 02/11/2022-AFP 2.4, CA 19.9 88, CEA 287 -Ultrasound-guided liver biopsy performed 02/15/2022-adenocarcinoma with extensive necrosis, CK7 positive, negative for CK20, CDX2, TTF-1, and PAX8, consistent with a pancreatic, lung, or upper gastrointestinal primary -PET 03/08/2022-hypermetabolic distal esophagus mass, hypermetabolic centrally necrotic hepatic mass, hypermetabolic periportal nodes, exophytic GE junction mass without hypermetabolism-likely benign lesion, no hypermetabolism in the pancreas head, bilateral hypermetabolic adrenal gland lesions, small hypermetabolic left parotid gland lesion.  No loss of mismatch repair protein expression, HER2 3+ -CTs 03/11/2022-no change from PET 03/08/2022, known distal esophagus mass-not visualized on unenhanced CT, large hepatic metastasis and upper abdominal nodal metastases, chronic pancreatitis with 4.3 cm pseudocyst in the pancreas head -Cycle 1 FOLFOX, trastuzumab, Pembrolizumab 04/06/2022 -Cycle 2 oxaliplatin, 5-FU bolus/pump and trastuzumab held due to hospitalization following cycle 1 with possible coronary vasospasm -Pembrolizumab 04/25/2022 -CT abdomen/pelvis 05/02/2022-increased  peripancreatic  edema, increased size of pancreas head pseudocyst, increased necrotic nodal masses at the GE junction -Cycle 3 oxaliplatin, trastuzumab, pembrolizumab 05/15/2022; 5-FU held secondary to cardiac toxicity following cycle 1 -Cycle 4 oxaliplatin, trastuzumab 05/29/2022, 5-FU held secondary to cardiac toxicity -Cycle 5 oxaliplatin, trastuzumab, Pembrolizumab (6-week dosing for Pembrolizumab) 06/12/2022, 5-FU held secondary to cardiac toxicity -Cycle 6 oxaliplatin, trastuzumab 06/28/2022, 5-FU held -Cycle 7 oxaliplatin, trastuzumab 07/17/2022, 5-FU held -CTs 07/19/2022-decrease in the dominant anterior liver mass, 2 new small liver lesions compared to 05/02/2022, new right lower lobe nodule, enlargement of epicardial lymph nodes, slight decrease in size of celiac axis, gastropathic ligament, and portacaval lymphadenopathy, unchanged esophageal thickening -Cycle 8 oxaliplatin and pembrolizumab 08/14/2022, 5-FU and trastuzumab held secondary to potential cardiac toxicity 2.  Myocardial infarction/cardiac arrest 01/31/2022 -Echocardiogram 02/01/2022-LVEF 60-65%, right ventricle severely dilated with hypokinesis in the basal and midportion, hypokinesis in the apical portion, right ventricular systolic function moderately reduced -Echocardiogram on 05/01/2022, LVEF 50-55%, decreased right ventricular systolic function, moderately enlarged right ventricle -Echocardiogram 08/05/2022-LVEF 45-50%, mildly decreased left ventricular function, moderate reduced right ventricular function 3.  Microcytic anemia -Labs from 02/11/2022-ferritin 151, iron 13, percent saturation 5% 4.  DVT/pulmonary embolism-bilateral lower extremity DVTs 01/10/2022, pulmonary emboli on chest CT 01/31/2022-apixaban 5.  PAD status post AKA in 2021 6.  RA 7.  Bilateral pleural effusions -Ultrasound-guided thoracentesis performed on 02/11/2022.  Cytology negative for malignancy. 8.  COPD 9.  Admission 03/11/2022 with E. coli bacteremia Ultrasound-guided  biopsy/aspiration of dominant necrotic appearing liver lesion 03/17/2022-scant fluid aspirated-negative culture, no organisms or white cells.  Pathology with necrotic tissue. 10.  Admission 04/29/2022 after a fall, multiple skull fractures 11.  Fever, hypotension-Klebsiella bacteremia on blood culture 04/30/2022 12.  Admission 04/09/2022 with chest pain, NSTEMI 13.  Admission 08/04/2022 with ventricular tachycardia, status post cardioversion, noted to have hypokalemia and hypomagnesemia, started on amiodarone      Disposition: [name]. Service has metastatic adenocarcinoma, most likely from gastroesophageal primary.  He has been maintained on systemic therapy is October 2023.  Multiple systemic therapy agents have been placed on hold due to the potential for cardiac toxicity.  5-FU and trastuzumab are currently on hold.  We will place oxaliplatin on hold due to neuropathy symptoms.  He will undergo a restaging CT evaluation prior to an office visit in 2 weeks.  We will decide on continuing the current systemic regimen versus a change in treatment based on the CT findings.  Betsy Coder, MD  08/30/2022  11:12 AM

## 2022-09-01 DIAGNOSIS — M5137 Other intervertebral disc degeneration, lumbosacral region: Secondary | ICD-10-CM | POA: Diagnosis not present

## 2022-09-01 DIAGNOSIS — Z86718 Personal history of other venous thrombosis and embolism: Secondary | ICD-10-CM | POA: Diagnosis not present

## 2022-09-01 DIAGNOSIS — K219 Gastro-esophageal reflux disease without esophagitis: Secondary | ICD-10-CM | POA: Diagnosis not present

## 2022-09-01 DIAGNOSIS — Z89612 Acquired absence of left leg above knee: Secondary | ICD-10-CM | POA: Diagnosis not present

## 2022-09-01 DIAGNOSIS — M069 Rheumatoid arthritis, unspecified: Secondary | ICD-10-CM | POA: Diagnosis not present

## 2022-09-01 DIAGNOSIS — J449 Chronic obstructive pulmonary disease, unspecified: Secondary | ICD-10-CM | POA: Diagnosis not present

## 2022-09-01 DIAGNOSIS — Z7902 Long term (current) use of antithrombotics/antiplatelets: Secondary | ICD-10-CM | POA: Diagnosis not present

## 2022-09-01 DIAGNOSIS — J9601 Acute respiratory failure with hypoxia: Secondary | ICD-10-CM | POA: Diagnosis not present

## 2022-09-01 DIAGNOSIS — C771 Secondary and unspecified malignant neoplasm of intrathoracic lymph nodes: Secondary | ICD-10-CM | POA: Diagnosis not present

## 2022-09-01 DIAGNOSIS — I2510 Atherosclerotic heart disease of native coronary artery without angina pectoris: Secondary | ICD-10-CM | POA: Diagnosis not present

## 2022-09-01 DIAGNOSIS — I472 Ventricular tachycardia, unspecified: Secondary | ICD-10-CM | POA: Diagnosis not present

## 2022-09-01 DIAGNOSIS — J439 Emphysema, unspecified: Secondary | ICD-10-CM | POA: Diagnosis not present

## 2022-09-01 DIAGNOSIS — Z8616 Personal history of COVID-19: Secondary | ICD-10-CM | POA: Diagnosis not present

## 2022-09-01 DIAGNOSIS — I10 Essential (primary) hypertension: Secondary | ICD-10-CM | POA: Diagnosis not present

## 2022-09-01 DIAGNOSIS — D630 Anemia in neoplastic disease: Secondary | ICD-10-CM | POA: Diagnosis not present

## 2022-09-01 DIAGNOSIS — I739 Peripheral vascular disease, unspecified: Secondary | ICD-10-CM | POA: Diagnosis not present

## 2022-09-01 DIAGNOSIS — I252 Old myocardial infarction: Secondary | ICD-10-CM | POA: Diagnosis not present

## 2022-09-01 DIAGNOSIS — Z79891 Long term (current) use of opiate analgesic: Secondary | ICD-10-CM | POA: Diagnosis not present

## 2022-09-01 DIAGNOSIS — Z9181 History of falling: Secondary | ICD-10-CM | POA: Diagnosis not present

## 2022-09-01 DIAGNOSIS — Z8673 Personal history of transient ischemic attack (TIA), and cerebral infarction without residual deficits: Secondary | ICD-10-CM | POA: Diagnosis not present

## 2022-09-01 DIAGNOSIS — E43 Unspecified severe protein-calorie malnutrition: Secondary | ICD-10-CM | POA: Diagnosis not present

## 2022-09-03 NOTE — Progress Notes (Deleted)
Cardiology Office Note:    Date:  09/03/2022   ID:  [name] [name], DOB 1961/04/26, MRN [id number]  PCP:  Mohammed Kindle, MD  Cardiologist:  None  Electrophysiologist:  Cristopher Peru, MD   Referring MD: Angelique Blonder, DO   Chief Complaint: hospital follow-up of VT and CAD  History of Present Illness:    [name] [name] is a 62 y.o. 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 Percival Spanish and presents today for hospital follow-up.  Patient was admitted in 10/2011 for right lower lobe pneumonia with right empyema. Echo during admission showed LVEF of 60-65% with normal wall motion  and grade 1 diastolic dysfunction as well as a calcific mass noted in the region of the right coronary cusp suspicious for Liebman-Sacks endocarditis. He underwent ultrasound-guided thoracentesis with no significant improvement. Therefore, he underwent VATS procedure with drainage of the empyema. He has a history of CAD as well as PAD.  LHC in 2018 showed mild to moderate non-obstructive CAD involving codominant LCX and RCA. He has undergone multiple intervention on left lower extremity and ultimately required left above knee amputation for an ischemic leg in 04/2020. He was diagnosed with acute right popliteal and tibial DVT in 12/2021 and started on Eliquis.He has had multiple admissions over the last 9 months. He was admitted on  01/31/2022 for witnessed cardiac arrest outside of hospital. Upon EMS arrival, he was noted to be in ventricular fibrillation. He received 3 shocks, 3 rounds of epinephrine, and 300mg  of Amiodarone with ultimate restoration of sinus rhythm. Upon arrival to the ED, EKG showed inferior ST elevation concerning for STEMI. Code STEMI was called but given prolonged down time this was cancelled. He required intubation and pressor support and was started on IV Heparin. Echo showed LVEF of 60-65% with no regional wall motion abnormalities, severe RV systolic dysfunction, and flattened interventricular septum in systole consistent with RV pressure overload. Chest CTA showed acute segmental PE in the left lower and right upper lobes with no definite CT evidence of right heart strain as well as complex ground-glass opacities in the mid to upper lungs bilaterally with areas of nodular consolidation and interlobular septal thickening consistent with contusions from CPR vs multifocal infectious/ inflammatory process. Also showed multiple anterior ribs fractures and a non-displaced mid sternal fracture from CPR. Repeat limited Echo the following day showed normal LV function but severely dilated RV with hypokinesis in the basal and mid portion but hyperkinesis in the apical portion consistent with McConnell's sign. He ultimately underwent LHC once his renal functional and mental status improved which showed 100% stenosis of proximal RCA and otherwise only 30% stenosis of distal LCX. Medical therapy was recommended. He was discharged on 02/07/2022 but was readmitted on 02/11/2022 for worsening shortness of breath. Chest CTA showed persistent non-occlusive Pes to the left lower and right upper lobes, improving bilateral ground glass opacities, and a small to moderate left and small right pleural effusion that had progressed. CTA also  showed a hepatic mass. Abdominal/ pelvic CT showed a ill-defined irregular mass in the medial segment of  the left liver, diffuse peripancreatic edema with ill-defined parenchyma in the head of the pancreas including a hypoenhancing ill-defined focal pancreatic head lesion, necrotic mass lesion just cranial to the esophagogastric junction with additional metastatic lymphadenopathy in the abdomen and pelvis, and irregular nodular peritoneal thickening in the lower abdomen and pelvis. Findings suspicious for a primary pancreatic adenocarcinoma with metastasis. He was given 1 dose of IV Lasix although was not felt to have an acute CHF exacerbation. He underwent a left sided thoracentesis of exudative pleural effusion. Pleural cytology was negative but this does not rule out malignancy.  IR performed a liver biopsy which came back positive for adenocarcinoma consistent with a pancreatic, lung, or upper GI primary lesion. He was seen by Oncology and was felt to have primary esophageal cancer with metastasis and was started on chemotherapy.   He was readmitted in 02/2022 with acute CVA. Brain MRI showed multiple foci suspicious for small acute or early acute ischemic infarcts, multiple cerebellar infarctions, remote PCA territory infarct, and multiple chronic lacunar infarcts. This occurred after he stopped taking his Eliquis. Infarcts were felt to be embolic in origin and secondary to hypercoagulability in setting of metastatic adenocarcinoma. He was restarted on Eliquis. He was readmitted later that month with sepsis and E. Coli bacteremia. He was then admitted in 03/2022 for chest pain. Troponin was elevated but this was felt to be due to demand ischemia in setting of nausea/ vomiting from chemotherapy rather than true ACS. Continue medical therapy was recommended. He was admitted in 04/2022 for Klebsiella pneumoniae bacteremia and encephalopathy after presenting with a fall. Bacteremia was felt to be due to liver metastasis and he was treated with antibiotics. Head CT showed multiple facial fractures. Plastic surgery was  consulted and felt there was no need for any surgical intervention. Hospitalization was complicated by coffee ground emesis and acute on chronic anemia. Hematemesis was felt to be secondary to maxillary sinus hematoma from facial fractures.   He was most recently admitted in 07/2022 for ventricular tachycardia requiring a single shock with restoration of normal sinus rhythm in the field. This occurred in the setting of hypokalemia and hypomagnesemia. Echo showed LVEF of 45-50% with grade 1 diastolic dysfunction, moderately enlarged RV with moderately reduced function, and flattened interventricular septum in systole and diastole consistent with RV pressure and volume overload. He was treated with IV Amiodarone and potassium and magnesium were repleted. He was not felt to be a good candidate for ICD given his multiple other comorbidities. He was discharged on PO Amiodarone taper.  He was recently seen by Oda Kilts, PA-C, for follow-up on 08/29/2022 at which time he reported some mild lightheadedness earlier that morning with transferring but was otherwise doing well with no chest pain, palpitations, or syncope. He was continued on Amiodarone 200mg  twice daily.   Patient presents today for follow-up. ***  CAD History of inferior STEMI in 01/2022. LHC showed 100% of proximal RCA with otherwise only minimal disease. Medical therapy was recommended. - No chest pain.  - Continue current antianginals: Amlodipine 10mg  daily and Imdur 30mg  daily.  - Currently on Plavix 75mg  daily. Plan is to continued this for 1 year after STEMI. Not on Aspirin due to need for Eliquis.  - Continue high-intensity statin.  Ventricular Tachycardia History of Ventricular Fibrillation Arrest History of VF arrest in 01/2022 in setting of STEMI. Recently admitted with ventricular tachycardia  in 07/2022 in setting of hypokalemia and hypomagnesemia. Started on Amiodarone. Not currently felt to be an ICD candidate given multiple  comorbidities including metastatic esophageal adenocarcinoma.  - *** - Continue Amiodarone 200mg  twice daily.  - Continue Eliquis 5mg  twice daily.   Chronic HFmrEF Most recent Echo in 07/2022 showed  LVEF of 45-50% with grade 1 diastolic dysfunction, moderately enlarged RV with moderately reduced function, and flattened interventricular septum in systole and diastole consistent with RV pressure and volume overload. - Euvolemic on exam.  - ***  PAD S/p multiple interventions on left lower extremity and ultimately required left above knee amputation for an ischemic leg in 04/2020. Last ABI in 06/2021 showed moderate right lower extremity arterial disease of 0.76.  - Followed by Vascular Surgery.   Hypertension BP well controlled. *** - Continue current medications: Amlodipine 10mg  daily and Imdur 30mg  daily.   Hyperlipidemia Lipid panel in 02/2022: Total Cholesterol 94, Triglycerides 186, HDL 26, LDL 31.  - Continue Lipitor 40mg  daily.   DVT/ PE Patient was diagnosed with a right lower extremity DVT in 12/2021 and then bilateral PE in 01/2022.  - Continue Eliquis 5mg  twice daily.   Past Medical History:  Diagnosis Date   Acute respiratory failure ([place]) 01/31/2022   Bilateral shoulder pain 08/20/2012   Bite wound of left hand 09/27/2021   CAD (coronary artery disease)    Occluded nondominent RCA managed medically - August 2023   Cardiac arrest [facility])    OOH VF - August 2023   Community acquired pneumonia 11/07/2011   Critical limb ischemia with history of revascularization of same extremity ([place]) 04/28/2020   DDD (degenerative disc disease), lumbosacral     and grade 2 slip   DVT (deep venous thrombosis) ([place]) 01/31/2022   Esophageal cancer (HCC)    Adenocarcinoma with metastasis   GERD (gastroesophageal reflux disease)    History of anemia    History of COVID-19 05/10/2019   History of critical lower limb ischemia 02/19/2019   Hypertension    Impingement syndrome of shoulder  region    Ischemic ulcer of toe of left foot (HCC)    Liver mass    Low back pain without sciatica    Lupus ([place])    Multiple fractures of ribs, bilateral, due to CPR trauma    Onychomycosis    Paronychia of great toe    Peripheral vascular disease (HCC)    Pressure injury of skin    Pulmonary embolus (HCC)    RA (rheumatoid arthritis) ([place])    Rotator cuff tear 11/04/2014   Sternal fracture    Stroke [facility])     Past Surgical History:  Procedure Laterality Date   ABDOMINAL AORTAGRAM  02/20/2019   ABDOMINAL AORTOGRAM W/LOWER EXTREMITY N/A 02/20/2019   Procedure: ABDOMINAL AORTOGRAM W/LOWER EXTREMITY;  Surgeon: Marty Heck, MD;  Location: [place] CV LAB;  Service: Cardiovascular;  Laterality: N/A;   ACHILLES TENDON SURGERY Bilateral    AMPUTATION Left 05/11/2020   Procedure: LEFT ABOVE KNEE AMPUTATION;  Surgeon: Angelia Mould, MD;  Location: [place];  Service: Vascular;  Laterality: Left;   ENDARTERECTOMY POPLITEAL Left 05/01/2020   Procedure: ENDARTERECTOMY POPLITEAL;  Surgeon: Waynetta Sandy, MD;  Location: [place];  Service: Vascular;  Laterality: Left;   FASCIOTOMY Left 05/01/2020   Procedure: FASCIOTOMY;  Surgeon: Waynetta Sandy, MD;  Location: [place];  Service: Vascular;  Laterality: Left;   FEMORAL-POPLITEAL BYPASS GRAFT Left 02/26/2019   Procedure: BYPASS GRAFT FEMORAL-POPLITEAL ARTERY LEFT  LEG USING 46mm PROPATEN GRAFT;  Surgeon: Serafina Mitchell, MD;  Location: [facility] OR;  Service: Vascular;  Laterality: Left;   INTRAOPERATIVE ARTERIOGRAM Left 01/22/2020   Procedure: INTRA OPERATIVE ARTERIOGRAM with administration of thrombolyics in Left femoral to popliteal bypass.;  Surgeon: Marty Heck, MD;  Location: [place];  Service: Vascular;  Laterality: Left;   IR IMAGING GUIDED PORT INSERTION  03/07/2022   LEFT HEART CATH AND CORONARY ANGIOGRAPHY N/A 03/15/2017   Procedure: LEFT HEART CATH AND CORONARY ANGIOGRAPHY;  Surgeon: Nelva Bush, MD;   Location: [place] CV LAB;  Service: Cardiovascular;  Laterality: N/A;   LEFT HEART CATH AND CORONARY ANGIOGRAPHY N/A 02/06/2022   Procedure: LEFT HEART CATH AND CORONARY ANGIOGRAPHY;  Surgeon: Martinique, Peter M, MD;  Location: [place] CV LAB;  Service: Cardiovascular;  Laterality: N/A;   LOWER EXTREMITY INTERVENTION Left 04/29/2020   Procedure: LOWER EXTREMITY INTERVENTION- LYSIS;  Surgeon: Marty Heck, MD;  Location: [place] CV LAB;  Service: Cardiovascular;  Laterality: Left;   OSTEOTOMY AND ULNAR SHORTENING Right 07/22/2002   PATCH ANGIOPLASTY Left 05/01/2020   Procedure: PATCH ANGIOPLASTY USING HEMASHIELD PLATINUM FINESSE PATCH;  Surgeon: Waynetta Sandy, MD;  Location: [place];  Service: Vascular;  Laterality: Left;   PERIPHERAL VASCULAR ATHERECTOMY  01/23/2020   Procedure: PERIPHERAL VASCULAR ATHERECTOMY;  Surgeon: Marty Heck, MD;  Location: [place] CV LAB;  Service: Cardiovascular;;   PERIPHERAL VASCULAR BALLOON ANGIOPLASTY  01/23/2020   Procedure: PERIPHERAL VASCULAR BALLOON ANGIOPLASTY;  Surgeon: Marty Heck, MD;  Location: [place] CV LAB;  Service: Cardiovascular;;   PERIPHERAL VASCULAR INTERVENTION Left 04/30/2020   Procedure: PERIPHERAL VASCULAR INTERVENTION;  Surgeon: Cherre Robins, MD;  Location: [place] CV LAB;  Service: Cardiovascular;  Laterality: Left;   PERIPHERAL VASCULAR THROMBECTOMY N/A 01/23/2020   Procedure: lysis recheck;  Surgeon: Marty Heck, MD;  Location: [place] [facility] LAB;  Service: Cardiovascular;  Laterality: N/A;  + [place]    PERIPHERAL VASCULAR THROMBECTOMY N/A 04/30/2020   Procedure: Lysis Recheck;  Surgeon: Cherre Robins, MD;  Location: [place] CV LAB;  Service: Cardiovascular;  Laterality: N/A;   SHOULDER ARTHROSCOPY WITH BICEPSTENOTOMY Right 03/11/2015   Procedure: SHOULDER ARTHROSCOPY WITH BICEPSTENOTOMY;  Surgeon: Ninetta Lights, MD;  Location: [place];  Service:  Orthopedics;  Laterality: Right;   SHOULDER ARTHROSCOPY WITH DISTAL CLAVICLE RESECTION Left 11/19/2014   Procedure: SHOULDER ARTHROSCOPY WITH DISTAL CLAVICLE RESECTION;  Surgeon: Kathryne Hitch, MD;  Location: [place];  Service: Orthopedics;  Laterality: Left;   SHOULDER ARTHROSCOPY WITH ROTATOR CUFF REPAIR AND SUBACROMIAL DECOMPRESSION Left 11/19/2014   Procedure: LEFT SHOULDER ARTHROSCOPY, DEBRIDEMENT DISTAL CLAVICLE EXCISION, ACROMIOPLASTY WITH ROTATOR CUFF REPAIR ;  Surgeon: Kathryne Hitch, MD;  Location: [place];  Service: Orthopedics;  Laterality: Left;   THROMBECTOMY OF BYPASS GRAFT FEMORAL- POPLITEAL ARTERY Left 05/01/2020   Procedure: THROMBECTOMY OF LOWER EXTREMITY;  Surgeon: Waynetta Sandy, MD;  Location: [place];  Service: Vascular;  Laterality: Left;   TRANSFORAMINAL LUMBAR INTERBODY FUSION (TLIF) WITH PEDICLE SCREW FIXATION 1 LEVEL N/A 01/03/2018   Procedure: TRANSFORAMINAL LUMBAR INTERBODY FUSION (TLIF) LUMBAR FIVE-SACRAL ONE;  Surgeon: Melina Schools, MD;  Location: [place];  Service: Orthopedics;  Laterality: N/A;   ULTRASOUND GUIDANCE FOR VASCULAR ACCESS Right 01/22/2020   Procedure: ULTRASOUND GUIDANCE FOR VASCULAR ACCESS Right Common Femoral Artery.;  Surgeon: Marty Heck, MD;  Location: [place];  Service: Vascular;  Laterality: Right;   [place] (  VATS)/DECORTICATION Right 11/21/2011   drainage of empyema    Current Medications: No outpatient medications have been marked as taking for the 09/14/22 encounter (Appointment) with Darreld Mclean, PA-C.     Allergies:   Dilaudid [hydromorphone hcl], Ramipril, and Tramadol   Social History   Socioeconomic History   Marital status: Single    Spouse name: Not on file   Number of children: 0   Years of education: Not on file   Highest education level: Not on file  Occupational History   Occupation: Disabled    Comment: 2/2 RA  Tobacco Use   Smoking status:  Former    Packs/day: 0.25    Years: 20.00    Total pack years: 5.00    Types: Cigarettes    Quit date: 02/19/2019    Years since quitting: 3.5   Smokeless tobacco: Never   Tobacco comments:    Patient stated he smokes a couple day and is attempting to quit smoking  Vaping Use   Vaping Use: Never used  Substance and Sexual Activity   Alcohol use: No    Alcohol/week: 0.0 standard drinks of alcohol    Comment: sober 1998   Drug use: No   Sexual activity: Not on file  Other Topics Concern   Not on file  Social History Narrative   On disability since 1992, used to work with city of [place]. Quit drinking in 1990.      Current Social History 11/05/2019        Patient lives by himself most of the time. Sometimes girlfriend's son stays with him in a one level home. There are 3 steps with handrail up to the entrance the patient uses.       Patient's method of transportation is personal truck. This was recently vandalized and patient doesn't have funds to repair at this time.      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, BSN, RN-BC    Social Determinants of Health   Financial Resource Strain: Low Risk  (05/05/2022)   Overall Financial Resource Strain (CARDIA)    Difficulty of Paying Living Expenses: Not hard at all  Food Insecurity: No Food Insecurity (08/10/2022)   Hunger Vital Sign    Worried About Running Out of Food in the Last Year: Never true    Ran Out of Food in the Last Year: Never true  Transportation Needs: Unmet Transportation Needs (08/05/2022)    PRAPARE - Hydrologist (Medical): Yes    Lack of Transportation (Non-Medical): Yes  Physical Activity: Not on file  Stress: Not on file  Social Connections: Not on file     Family History: The patient's family history includes Alcohol abuse in his father; Aneurysm in his mother; Heart attack (age of onset: 11) in his father. There is no history of Stroke or Cancer.  ROS:   Please see the history of  present illness.     EKGs/Labs/Other Studies Reviewed:    The following studies were reviewed:  Complete Echocardiogram 01/31/2022: Impressions: 1. Left ventricular ejection fraction, by estimation, is 60 to 65%. The  left ventricle has normal function. The left ventricle has no regional  wall motion abnormalities. Indeterminate diastolic filling due to E-A  fusion. There is the interventricular  septum is flattened in systole, consistent with right ventricular pressure  overload.   2. Right ventricular systolic function is severely reduced. The right  ventricular size is mildly enlarged. Tricuspid regurgitation signal is  inadequate for assessing PA pressure.   3. The mitral valve is normal in structure. No evidence of mitral valve  regurgitation.   4. The aortic valve was not well visualized. Aortic valve regurgitation  is not visualized. Aortic valve sclerosis is present, with no evidence of  aortic valve stenosis.   Comparison(s): Prior images reviewed side by side. The right ventricular  hypertrophy is significantly worse.   Conclusion(s)/Recommendation(s): Discussed findings with primary team.  _______________  Limited Echocardiogram 02/01/2022: Impressions: 1. Left ventricular ejection fraction, by estimation, is 60 to 65%. The  left ventricle has normal function. Left ventricular diastolic parameters  are indeterminate. There is the interventricular septum is flattened in  systole, consistent with right  ventricular pressure overload.   2. The  Right ventricle is severly dilated with hypokinesis in the basal  and mid portion but hyperkinesis in the apical portion - consistent with  McConnell's sign which was also present in the TTE performed 01/31/22. Right  ventricular systolic function is  moderately reduced. The right ventricular size is severely enlarged. There  is normal pulmonary artery systolic pressure.   3. The mitral valve is grossly normal. No evidence of mitral valve  regurgitation. No evidence of mitral stenosis.   4. The aortic valve is grossly normal. Aortic valve regurgitation is  trivial. Aortic valve sclerosis is present, with no evidence of aortic  valve stenosis.   5. The inferior vena cava is normal in size with greater than 50%  respiratory variability, suggesting right atrial pressure of 3 mmHg.  _______________  Left Cardiac Catheterization 02/06/2022:   Dist Cx lesion is 30% stenosed.   Prox RCA lesion is 100% stenosed.   LV end diastolic pressure is normal.   Single vessel occlusive CAD. Occlusion of proximal nondominant RCA which is new since 2018.  Normal LVEDP   Plan: medical therapy  Diagnostic Dominance: Left   _______________  Complete Echocardiogram 08/05/2022: Impressions:1. Left ventricular ejection fraction, by estimation, is 45 to 50%. The  left ventricle has mildly decreased function. Left ventricular endocardial  border not optimally defined to evaluate regional wall motion. There is  mild left ventricular hypertrophy.   Left ventricular diastolic parameters are consistent with Grade I  diastolic dysfunction (impaired relaxation). There is the interventricular  septum is flattened in systole and diastole, consistent with right  ventricular pressure and volume overload.   2. Right ventricular systolic function is moderately reduced. The right  ventricular size is moderately enlarged. There is normal pulmonary artery  systolic pressure. The estimated right ventricular systolic  pressure is  99991111 mmHg.   3. Right atrial size was moderately dilated.   4. The mitral valve is normal in structure. No evidence of mitral valve  regurgitation. No evidence of mitral stenosis.   5. The aortic valve is tricuspid. There is moderate calcification of the  aortic valve. Aortic valve regurgitation is not visualized. No aortic  stenosis is  present.   6. The inferior vena cava is normal in size with greater than 50%  respiratory variability, suggesting right atrial pressure of 3 mmHg.   Comparison(s): No significant change from prior study.     EKG:  EKG not ordered today.   Recent Labs: 05/01/2022: B Natriuretic Peptide 430.5 08/14/2022: TSH 2.152 08/16/2022: Magnesium 1.9 08/30/2022: ALT 82; BUN 16; Creatinine 0.72; Hemoglobin 11.2; Platelet Count 101; Potassium 3.9; Sodium 138  Recent Lipid Panel    Component Value Date/Time   CHOL 94 02/28/2022 0651   TRIG 186 (H) 02/28/2022 0651   HDL 26 (L) 02/28/2022 0651   CHOLHDL 3.6 02/28/2022 0651   VLDL 37 02/28/2022 0651   LDLCALC 31 02/28/2022 0651    Physical Exam:    Vital Signs: There were no vitals taken for this visit.    Wt Readings from Last 3 Encounters:  08/30/22 142 lb (64.4 kg)  08/29/22 142 lb (64.4 kg)  08/14/22 139 lb (63 kg)     General: 62 y.o. male in no acute distress. HEENT: Normocephalic and atraumatic. Sclera clear. EOMs intact. Neck: Supple. No carotid bruits. No JVD. Heart: *** RRR. Distinct S1 and S2. No murmurs, gallops, or rubs. Radial and distal pedal pulses 2+ and equal bilaterally. Lungs: No increased work of breathing. Clear to ausculation bilaterally. No wheezes, rhonchi, or rales.  Abdomen: Soft, non-distended, and non-tender to palpation. Bowel sounds present in all 4 quadrants.  MSK: Normal strength and tone for age. *** Extremities: No lower extremity edema.    Skin: Warm and dry. Neuro: Alert and oriented x3. No focal deficits. Psych: Normal affect. Responds  appropriately.   Assessment:    No diagnosis found.  Plan:     Disposition: Follow up in ***   Medication Adjustments/Labs and Tests Ordered: Current medicines are reviewed at length with the patient today.  Concerns regarding medicines are outlined above.  No orders of the defined types were placed in this encounter.  No orders of the defined types were placed in this encounter.   There are no Patient Instructions on file for this visit.   Signed, Darreld Mclean, PA-C  09/03/2022 2:36 PM    [facility]

## 2022-09-04 DIAGNOSIS — M129 Arthropathy, unspecified: Secondary | ICD-10-CM | POA: Diagnosis not present

## 2022-09-04 DIAGNOSIS — M069 Rheumatoid arthritis, unspecified: Secondary | ICD-10-CM | POA: Diagnosis not present

## 2022-09-04 DIAGNOSIS — M545 Low back pain, unspecified: Secondary | ICD-10-CM | POA: Diagnosis not present

## 2022-09-04 DIAGNOSIS — M25511 Pain in right shoulder: Secondary | ICD-10-CM | POA: Diagnosis not present

## 2022-09-04 DIAGNOSIS — F17210 Nicotine dependence, cigarettes, uncomplicated: Secondary | ICD-10-CM | POA: Diagnosis not present

## 2022-09-04 DIAGNOSIS — M25512 Pain in left shoulder: Secondary | ICD-10-CM | POA: Diagnosis not present

## 2022-09-04 DIAGNOSIS — Z79899 Other long term (current) drug therapy: Secondary | ICD-10-CM | POA: Diagnosis not present

## 2022-09-06 DIAGNOSIS — J9601 Acute respiratory failure with hypoxia: Secondary | ICD-10-CM | POA: Diagnosis not present

## 2022-09-06 DIAGNOSIS — I2510 Atherosclerotic heart disease of native coronary artery without angina pectoris: Secondary | ICD-10-CM | POA: Diagnosis not present

## 2022-09-06 DIAGNOSIS — C771 Secondary and unspecified malignant neoplasm of intrathoracic lymph nodes: Secondary | ICD-10-CM | POA: Diagnosis not present

## 2022-09-06 DIAGNOSIS — M069 Rheumatoid arthritis, unspecified: Secondary | ICD-10-CM | POA: Diagnosis not present

## 2022-09-06 DIAGNOSIS — I252 Old myocardial infarction: Secondary | ICD-10-CM | POA: Diagnosis not present

## 2022-09-06 DIAGNOSIS — I739 Peripheral vascular disease, unspecified: Secondary | ICD-10-CM | POA: Diagnosis not present

## 2022-09-06 DIAGNOSIS — Z7902 Long term (current) use of antithrombotics/antiplatelets: Secondary | ICD-10-CM | POA: Diagnosis not present

## 2022-09-06 DIAGNOSIS — Z89612 Acquired absence of left leg above knee: Secondary | ICD-10-CM | POA: Diagnosis not present

## 2022-09-06 DIAGNOSIS — E43 Unspecified severe protein-calorie malnutrition: Secondary | ICD-10-CM | POA: Diagnosis not present

## 2022-09-06 DIAGNOSIS — Z79891 Long term (current) use of opiate analgesic: Secondary | ICD-10-CM | POA: Diagnosis not present

## 2022-09-06 DIAGNOSIS — Z8616 Personal history of COVID-19: Secondary | ICD-10-CM | POA: Diagnosis not present

## 2022-09-06 DIAGNOSIS — Z86718 Personal history of other venous thrombosis and embolism: Secondary | ICD-10-CM | POA: Diagnosis not present

## 2022-09-06 DIAGNOSIS — Z8673 Personal history of transient ischemic attack (TIA), and cerebral infarction without residual deficits: Secondary | ICD-10-CM | POA: Diagnosis not present

## 2022-09-06 DIAGNOSIS — J449 Chronic obstructive pulmonary disease, unspecified: Secondary | ICD-10-CM | POA: Diagnosis not present

## 2022-09-06 DIAGNOSIS — M5137 Other intervertebral disc degeneration, lumbosacral region: Secondary | ICD-10-CM | POA: Diagnosis not present

## 2022-09-06 DIAGNOSIS — K219 Gastro-esophageal reflux disease without esophagitis: Secondary | ICD-10-CM | POA: Diagnosis not present

## 2022-09-06 DIAGNOSIS — Z9181 History of falling: Secondary | ICD-10-CM | POA: Diagnosis not present

## 2022-09-06 DIAGNOSIS — J439 Emphysema, unspecified: Secondary | ICD-10-CM | POA: Diagnosis not present

## 2022-09-06 DIAGNOSIS — D630 Anemia in neoplastic disease: Secondary | ICD-10-CM | POA: Diagnosis not present

## 2022-09-06 DIAGNOSIS — I10 Essential (primary) hypertension: Secondary | ICD-10-CM | POA: Diagnosis not present

## 2022-09-06 DIAGNOSIS — I472 Ventricular tachycardia, unspecified: Secondary | ICD-10-CM | POA: Diagnosis not present

## 2022-09-08 DIAGNOSIS — Z79899 Other long term (current) drug therapy: Secondary | ICD-10-CM | POA: Diagnosis not present

## 2022-09-11 ENCOUNTER — Inpatient Hospital Stay: Payer: 59

## 2022-09-11 ENCOUNTER — Ambulatory Visit (HOSPITAL_BASED_OUTPATIENT_CLINIC_OR_DEPARTMENT_OTHER): Admission: RE | Admit: 2022-09-11 | Payer: 59 | Source: Ambulatory Visit

## 2022-09-12 ENCOUNTER — Ambulatory Visit (HOSPITAL_COMMUNITY)
Admission: RE | Admit: 2022-09-12 | Discharge: 2022-09-12 | Disposition: A | Discharge status: Home / Self Care | Payer: 59 | Source: Ambulatory Visit | Attending: Oncology | Admitting: Oncology

## 2022-09-12 ENCOUNTER — Inpatient Hospital Stay: Payer: 59

## 2022-09-12 DIAGNOSIS — M069 Rheumatoid arthritis, unspecified: Secondary | ICD-10-CM | POA: Diagnosis not present

## 2022-09-12 DIAGNOSIS — E43 Unspecified severe protein-calorie malnutrition: Secondary | ICD-10-CM | POA: Diagnosis not present

## 2022-09-12 DIAGNOSIS — I10 Essential (primary) hypertension: Secondary | ICD-10-CM | POA: Diagnosis not present

## 2022-09-12 DIAGNOSIS — I2510 Atherosclerotic heart disease of native coronary artery without angina pectoris: Secondary | ICD-10-CM | POA: Diagnosis not present

## 2022-09-12 DIAGNOSIS — Z9181 History of falling: Secondary | ICD-10-CM | POA: Diagnosis not present

## 2022-09-12 DIAGNOSIS — M5137 Other intervertebral disc degeneration, lumbosacral region: Secondary | ICD-10-CM | POA: Diagnosis not present

## 2022-09-12 DIAGNOSIS — C160 Malignant neoplasm of cardia: Secondary | ICD-10-CM | POA: Insufficient documentation

## 2022-09-12 DIAGNOSIS — K219 Gastro-esophageal reflux disease without esophagitis: Secondary | ICD-10-CM | POA: Diagnosis not present

## 2022-09-12 DIAGNOSIS — D630 Anemia in neoplastic disease: Secondary | ICD-10-CM | POA: Diagnosis not present

## 2022-09-12 DIAGNOSIS — I472 Ventricular tachycardia, unspecified: Secondary | ICD-10-CM | POA: Diagnosis not present

## 2022-09-12 DIAGNOSIS — Z86718 Personal history of other venous thrombosis and embolism: Secondary | ICD-10-CM | POA: Diagnosis not present

## 2022-09-12 DIAGNOSIS — C801 Malignant (primary) neoplasm, unspecified: Secondary | ICD-10-CM | POA: Insufficient documentation

## 2022-09-12 DIAGNOSIS — I252 Old myocardial infarction: Secondary | ICD-10-CM | POA: Diagnosis not present

## 2022-09-12 DIAGNOSIS — Z79891 Long term (current) use of opiate analgesic: Secondary | ICD-10-CM | POA: Diagnosis not present

## 2022-09-12 DIAGNOSIS — J9601 Acute respiratory failure with hypoxia: Secondary | ICD-10-CM | POA: Diagnosis not present

## 2022-09-12 DIAGNOSIS — C771 Secondary and unspecified malignant neoplasm of intrathoracic lymph nodes: Secondary | ICD-10-CM | POA: Diagnosis not present

## 2022-09-12 DIAGNOSIS — C787 Secondary malignant neoplasm of liver and intrahepatic bile duct: Secondary | ICD-10-CM | POA: Insufficient documentation

## 2022-09-12 DIAGNOSIS — J439 Emphysema, unspecified: Secondary | ICD-10-CM | POA: Diagnosis not present

## 2022-09-12 DIAGNOSIS — Z7902 Long term (current) use of antithrombotics/antiplatelets: Secondary | ICD-10-CM | POA: Diagnosis not present

## 2022-09-12 DIAGNOSIS — Z89612 Acquired absence of left leg above knee: Secondary | ICD-10-CM | POA: Diagnosis not present

## 2022-09-12 DIAGNOSIS — Z8616 Personal history of COVID-19: Secondary | ICD-10-CM | POA: Diagnosis not present

## 2022-09-12 DIAGNOSIS — Z8673 Personal history of transient ischemic attack (TIA), and cerebral infarction without residual deficits: Secondary | ICD-10-CM | POA: Diagnosis not present

## 2022-09-12 DIAGNOSIS — I739 Peripheral vascular disease, unspecified: Secondary | ICD-10-CM | POA: Diagnosis not present

## 2022-09-12 DIAGNOSIS — R590 Localized enlarged lymph nodes: Secondary | ICD-10-CM | POA: Insufficient documentation

## 2022-09-12 DIAGNOSIS — J449 Chronic obstructive pulmonary disease, unspecified: Secondary | ICD-10-CM | POA: Diagnosis not present

## 2022-09-12 MED ORDER — IOHEXOL 300 MG/ML  SOLN
100.0000 mL | Freq: Once | INTRAMUSCULAR | Status: AC | PRN
  Administered 2022-09-12: 100 mL via INTRAVENOUS

## 2022-09-12 MED ORDER — IOHEXOL 9 MG/ML PO SOLN
500.0000 mL | ORAL | Status: AC
  Administered 2022-09-12: 500 mL via ORAL

## 2022-09-13 ENCOUNTER — Inpatient Hospital Stay (HOSPITAL_BASED_OUTPATIENT_CLINIC_OR_DEPARTMENT_OTHER): Payer: 59 | Admitting: Nurse Practitioner

## 2022-09-13 ENCOUNTER — Encounter: Payer: Self-pay | Admitting: Nurse Practitioner

## 2022-09-13 ENCOUNTER — Inpatient Hospital Stay: Payer: 59

## 2022-09-13 ENCOUNTER — Encounter: Payer: Self-pay | Admitting: Oncology

## 2022-09-13 VITALS — BP 100/72 | HR 79 | Temp 98.1°F | Resp 18 | Ht 67.0 in | Wt 139.2 lb

## 2022-09-13 DIAGNOSIS — C801 Malignant (primary) neoplasm, unspecified: Secondary | ICD-10-CM

## 2022-09-13 DIAGNOSIS — R970 Elevated carcinoembryonic antigen [CEA]: Secondary | ICD-10-CM | POA: Diagnosis not present

## 2022-09-13 DIAGNOSIS — D509 Iron deficiency anemia, unspecified: Secondary | ICD-10-CM | POA: Diagnosis not present

## 2022-09-13 DIAGNOSIS — Z95828 Presence of other vascular implants and grafts: Secondary | ICD-10-CM | POA: Diagnosis not present

## 2022-09-13 DIAGNOSIS — Z86718 Personal history of other venous thrombosis and embolism: Secondary | ICD-10-CM | POA: Diagnosis not present

## 2022-09-13 DIAGNOSIS — C159 Malignant neoplasm of esophagus, unspecified: Secondary | ICD-10-CM | POA: Diagnosis not present

## 2022-09-13 DIAGNOSIS — Z7901 Long term (current) use of anticoagulants: Secondary | ICD-10-CM | POA: Diagnosis not present

## 2022-09-13 DIAGNOSIS — I2699 Other pulmonary embolism without acute cor pulmonale: Secondary | ICD-10-CM | POA: Diagnosis not present

## 2022-09-13 DIAGNOSIS — Z86711 Personal history of pulmonary embolism: Secondary | ICD-10-CM | POA: Diagnosis not present

## 2022-09-13 DIAGNOSIS — C787 Secondary malignant neoplasm of liver and intrahepatic bile duct: Secondary | ICD-10-CM | POA: Diagnosis not present

## 2022-09-13 DIAGNOSIS — J449 Chronic obstructive pulmonary disease, unspecified: Secondary | ICD-10-CM | POA: Diagnosis not present

## 2022-09-13 DIAGNOSIS — Z5111 Encounter for antineoplastic chemotherapy: Secondary | ICD-10-CM | POA: Diagnosis not present

## 2022-09-13 DIAGNOSIS — R590 Localized enlarged lymph nodes: Secondary | ICD-10-CM | POA: Diagnosis not present

## 2022-09-13 DIAGNOSIS — I252 Old myocardial infarction: Secondary | ICD-10-CM | POA: Diagnosis not present

## 2022-09-13 LAB — CBC WITH DIFFERENTIAL (CANCER CENTER ONLY)
Abs Immature Granulocytes: 0.01 10*3/uL (ref 0.00–0.07)
Basophils Absolute: 0.1 10*3/uL (ref 0.0–0.1)
Basophils Relative: 1 %
Eosinophils Absolute: 0.2 10*3/uL (ref 0.0–0.5)
Eosinophils Relative: 3 %
HCT: 37.7 % — ABNORMAL LOW (ref 39.0–52.0)
Hemoglobin: 12 g/dL — ABNORMAL LOW (ref 13.0–17.0)
Immature Granulocytes: 0 %
Lymphocytes Relative: 17 %
Lymphs Abs: 1.1 10*3/uL (ref 0.7–4.0)
MCH: 28.8 pg (ref 26.0–34.0)
MCHC: 31.8 g/dL (ref 30.0–36.0)
MCV: 90.6 fL (ref 80.0–100.0)
Monocytes Absolute: 0.6 10*3/uL (ref 0.1–1.0)
Monocytes Relative: 9 %
Neutro Abs: 4.6 10*3/uL (ref 1.7–7.7)
Neutrophils Relative %: 70 %
Platelet Count: 140 10*3/uL — ABNORMAL LOW (ref 150–400)
RBC: 4.16 MIL/uL — ABNORMAL LOW (ref 4.22–5.81)
RDW: 18.7 % — ABNORMAL HIGH (ref 11.5–15.5)
WBC Count: 6.5 10*3/uL (ref 4.0–10.5)
nRBC: 0 % (ref 0.0–0.2)

## 2022-09-13 LAB — CMP (CANCER CENTER ONLY)
ALT: 46 U/L — ABNORMAL HIGH (ref 0–44)
AST: 41 U/L (ref 15–41)
Albumin: 3.9 g/dL (ref 3.5–5.0)
Alkaline Phosphatase: 234 U/L — ABNORMAL HIGH (ref 38–126)
Anion gap: 7 (ref 5–15)
BUN: 17 mg/dL (ref 8–23)
CO2: 24 mmol/L (ref 22–32)
Calcium: 9.3 mg/dL (ref 8.9–10.3)
Chloride: 106 mmol/L (ref 98–111)
Creatinine: 0.79 mg/dL (ref 0.61–1.24)
GFR, Estimated: >60 mL/min (ref >60)
Glucose, Bld: 88 mg/dL (ref 70–99)
Potassium: 3.9 mmol/L (ref 3.5–5.1)
Sodium: 137 mmol/L (ref 135–145)
Total Bilirubin: 0.4 mg/dL (ref 0.3–1.2)
Total Protein: 7.1 g/dL (ref 6.5–8.1)

## 2022-09-13 MED ORDER — HEPARIN SOD (PORK) LOCK FLUSH 100 UNIT/ML IV SOLN
500.0000 [IU] | Freq: Once | INTRAVENOUS | Status: AC
  Administered 2022-09-13: 500 [IU] via INTRAVENOUS

## 2022-09-13 MED ORDER — SODIUM CHLORIDE 0.9% FLUSH
10.0000 mL | INTRAVENOUS | Status: DC | PRN
  Administered 2022-09-13: 10 mL via INTRAVENOUS

## 2022-09-13 MED ORDER — DEXAMETHASONE 2 MG PO TABS: ORAL_TABLET | ORAL | 0 refills | Status: DC

## 2022-09-13 NOTE — Patient Instructions (Signed)
Central Line, Adult A central line is a long, thin tube (catheter) that can be used to collect blood for testing or to give medicine through a vein. The tip of the central line ends in a large vein just above the heart (vena cava). A central line may be placed because: You need to get medicines or fluids through an IV for a long period of time. You need nutrition but cannot eat or absorb nutrients. The veins in your hands or arms are difficult to use for IV access. You need a blood transfusion. You need chemotherapy or dialysis. Types of central lines There are four main types of central lines: Peripherally inserted central catheter (PICC) line. This type is used for access of one week or longer. It can be used to draw blood and give fluids or medicines. A PICC looks like an IV tube, but it goes up the arm to the heart. It is usually inserted in the upper arm and taped in place on the arm. Tunneled central line. This type is used for long-term therapy and dialysis. It is placed in a large vein in the neck, chest, or groin. It is inserted through a small incision made over the vein, and then it is advanced to the heart. It is tunneled under the skin and brought out through a second incision. Non-tunneled central line. This type is used for short-term access, usually for a maximum of 7 days. It is often used in the emergency department. It is inserted in the neck, chest, or groin. Implanted port. This type is used for long-term therapy. It can stay in place longer than other types of central lines. It is normally inserted in the upper chest, but it can also be placed in the upper arm or the abdomen. It is inserted and removed with surgery, and it is accessed using a needle. The type of central line that you receive depends on how long you will need it, your medical condition, and the condition of your veins. Tell a health care provider about: Any allergies you have. All medicines you are taking,  including vitamins, herbs, eye drops, creams, and over-the-counter medicines. Any problems you or family members have had with anesthetic medicines. Any blood disorders you have. Any surgeries you have had. Any medical conditions you have. Whether you are pregnant or may be pregnant. What are the risks? Generally, placement and use of a central line is safe. However, problems may occur, including: Infection. A blood clot that blocks the central line or forms in the vein and travels to the heart. Bleeding from the place where the central line was inserted. Developing a hole or crack within the central line. If this happens, the central line will need to be replaced. Central line failure. The catheter moving or coming out of place. What happens before the procedure? Medicines Ask your health care provider about: Changing or stopping your regular medicines. This is especially important if you are taking diabetes medicines or blood thinners. Taking medicines such as aspirin and ibuprofen. These medicines can thin your blood. Do not take these medicines unless your health care provider tells you to take them. Taking over-the-counter medicines, vitamins, herbs, and supplements. General instructions Follow instructions from your health care provider about eating or drinking restrictions. Ask your health care provider: How your procedure site will be marked. What steps will be taken to help prevent infection. These steps may include: Removing hair at the procedure site. Washing skin with a germ-killing soap.   Plan to have a responsible adult take you home from the hospital or clinic. If you will be going home right after the procedure, plan to have a responsible adult care for you for the time you are told. This is important. What happens during the procedure? The procedure will vary depending on the type of central line being placed. In general: An IV will be inserted into one of your  veins. You will be given one or more of the following: A medicine to help you relax (sedative). A medicine to numb the area (local anesthetic). Your skin will be cleaned with a germ-killing (antiseptic) solution, and you may be covered with sterile drapes. Your blood pressure, heart rate, breathing rate, and blood oxygen level will be monitored during the procedure. The central line catheter will be inserted into the vein and advanced to the correct spot. The health care provider may use X-ray equipment to help guide the catheter to the right place. A bandage (dressing) will be placed over the insertion area. The procedure may vary among health care providers and hospitals. What can I expect after the procedure? Your blood pressure, heart rate, breathing rate, and blood oxygen level will be monitored until you leave the hospital or clinic. Antiseptic caps may be placed on the ends of the central line tubing. If you were given a sedative during the procedure, it can affect you for several hours. Do not drive or operate machinery until your health care provider says that it is safe. Follow these instructions at home: Flushing and cleaning the central line  Follow instructions from your health care provider about flushing and cleaning the central line and the area around it. Only use sterile supplies to flush the central line. Use supplies from your health care provider, a pharmacy, or another source that is recommended by your health care provider. Before you flush the central line or clean the central line or the area around it: Wash your hands with soap and water for at least 20 seconds. If soap and water are not available, use alcohol-based hand sanitizer. Clean the central line hub with rubbing alcohol. Unless directed otherwise by the manufacturer's instructions, scrub using a twisting motion and rub for 10 to 15 seconds or for 30 twists. Be sure you scrub the top of the hub, not just the  sides. Never reuse alcohol pads. Let the hub dry before use. Prevent it from touching anything while drying. Caring for the incision or central line site Check your incision or central line site every day for signs of infection. Check for: Redness, swelling, or pain. Fluid or blood. Warmth. Pus or a bad smell. Keep the insertion site of your central line clean and dry at all times. Change your dressing only as told by your health care provider. Keep your dressing dry. If it gets wet, have it changed as soon as possible. General instructions Follow instructions from your health care provider for the type of device that you have. Keep the tube clamped, unless it is being used. If the central line accidentally gets pulled on, make sure: The dressing is okay. There is no bleeding. The line has not been pulled out. Do not use scissors or sharp objects near the tube. Do not take baths, swim, or use a hot tub until your health care provider approves. Ask your health care provider if you may take showers. You may only be allowed to take sponge baths. Ask your health care provider what activities are   safe for you. You may be restricted from lifting or making repetitive arm movements on the side of your central line. Take over-the-counter and prescription medicines only as told by your health care provider. Keep all follow-up visits. This is important. Storage and disposal of supplies Keep your supplies in a clean, dry location. Throw away any used syringes in a disposal container that is meant for sharp items (sharps container). You can buy a sharps container from a pharmacy, or you can make one by using an empty hard plastic bottle with a cover. Place any used dressings or infusion bags into a plastic bag. Throw that bag in the trash. Contact a health care provider if: You have redness, swelling, or pain around your insertion site. You have fluid or blood coming from your insertion site. Your  insertion site feels warm to the touch. You have pus or a bad smell coming from your insertion site. Get help right away if: You have: A fever or chills. Shortness of breath. Chest pain or a racing heartbeat. Swelling in your neck, face, chest, or arm on the side of your central line. You feel dizzy or you faint. Your incision or central line site has red streaks spreading away from the area. Your incision or central line site is bleeding and does not stop. Your central line is difficult to flush or will not flush. You do not get a blood return from the central line. Your central line gets loose or damaged or comes out. Your catheter leaks when flushed or when fluids are infused into it. Summary A central line is a long, thin tube (catheter) that can be used to give medicine through a vein. Follow specific instructions from your health care provider for the type of device that you have. Keep the insertion site of your central line clean and dry at all times. Keep the tube clamped, unless it is being used. This information is not intended to replace advice given to you by your health care provider. Make sure you discuss any questions you have with your health care provider. Document Revised: 02/12/2020 Document Reviewed: 02/12/2020 Elsevier Patient Education  2023 [employer]  

## 2022-09-13 NOTE — Progress Notes (Signed)
Patient seen by Lisa Thomas NP today  Vitals are within treatment parameters.  Labs reviewed by Lisa Thomas NP and are within treatment parameters.  Per physician team, patient will not be receiving treatment today.  

## 2022-09-13 NOTE — Progress Notes (Signed)
[name] OFFICE PROGRESS NOTE   Diagnosis:  Metastatic adenocarcinoma  INTERVAL HISTORY:   [name] [name] returns as scheduled.  No episodes of chest pain.  He has occasional nausea.  No diarrhea.  He describes his appetite as "pretty good".  Stable numbness in the right hand which he thinks began since he started chemotherapy.  Objective:  Vital signs in last 24 hours:  Blood pressure 100/72, pulse 79, temperature 98.1 F (36.7 C), temperature source Oral, resp. rate 18, height 5\' 7"  (1.702 m), weight 139 lb 3.2 oz (63.1 kg), SpO2 95 %.    HEENT: No thrush or ulcers. Resp: Distant breath sounds.  No respiratory distress. Cardio: Regular rate and rhythm. GI: Abdomen soft and nontender.  No hepatomegaly. Vascular: No right leg edema. Skin: Palms with mild erythema. Port-A-Cath without erythema.  Lab Results:  Lab Results  Component Value Date   WBC 6.5 09/13/2022   HGB 12.0 (L) 09/13/2022   HCT 37.7 (L) 09/13/2022   MCV 90.6 09/13/2022   PLT 140 (L) 09/13/2022   NEUTROABS 4.6 09/13/2022    Imaging:  No results found.  Medications: I have reviewed the patient's current medications.  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 -02/11/2022 CTA chest-7.8 x 8.5 cm subtle hypoattenuating mass lesion in segment IV of the liver. -02/11/2022 CT abdomen/pelvis-8.7 x 7.8 or ill-defined irregular mass in the medial segment left liver, 4.5 x 3.4 cm necrotic mass lesion just cranial to the esophagogastric junction with metastatic lymphadenopathy in the abdomen and pelvis, irregular nodular peritoneal thickening in the lower abdomen and pelvis -02/11/2022 MRI abdomen-rim-enhancing likely necrotic mass or lymph node conglomerate centered around the gastroesophageal junction measuring 5.0 x 3.9 cm, large rim-enhancing  likely necrotic liver mass centered in the anterior left lobe of the liver measuring 7.8 x 7.2 cm, enlarged gastrohepatic ligament and retroperitoneal lymph nodes -Labs from 02/11/2022-AFP 2.4, CA 19.9 88, CEA 287 -Ultrasound-guided liver biopsy performed 02/15/2022-adenocarcinoma with extensive necrosis, CK7 positive, negative for CK20, CDX2, TTF-1, and PAX8, consistent with a pancreatic, lung, or upper gastrointestinal primary -PET 03/08/2022-hypermetabolic distal esophagus mass, hypermetabolic centrally necrotic hepatic mass, hypermetabolic periportal nodes, exophytic GE junction mass without hypermetabolism-likely benign lesion, no hypermetabolism in the pancreas head, bilateral hypermetabolic adrenal gland lesions, small hypermetabolic left parotid gland lesion.  No loss of mismatch repair protein expression, HER2 3+ -CTs 03/11/2022-no change from PET 03/08/2022, known distal esophagus mass-not visualized on unenhanced CT, large hepatic metastasis and upper abdominal nodal metastases, chronic pancreatitis with 4.3 cm pseudocyst in the pancreas head -Cycle 1 FOLFOX, trastuzumab, Pembrolizumab 04/06/2022 -Cycle 2 oxaliplatin, 5-FU bolus/pump and trastuzumab held due to hospitalization following cycle 1 with possible coronary vasospasm -Pembrolizumab 04/25/2022 -CT abdomen/pelvis 05/02/2022-increased peripancreatic edema, increased size of pancreas head pseudocyst, increased necrotic nodal masses at the GE junction -Cycle 3 oxaliplatin, trastuzumab, pembrolizumab 05/15/2022; 5-FU held secondary to cardiac toxicity following cycle 1 -Cycle 4 oxaliplatin, trastuzumab 05/29/2022, 5-FU held secondary to cardiac toxicity -Cycle 5 oxaliplatin, trastuzumab, Pembrolizumab (6-week dosing for Pembrolizumab) 06/12/2022, 5-FU held secondary to cardiac toxicity -Cycle 6 oxaliplatin, trastuzumab 06/28/2022, 5-FU held -Cycle 7 oxaliplatin, trastuzumab 07/17/2022, 5-FU held -CTs 07/19/2022-decrease in the dominant anterior  liver mass, 2 new small liver lesions compared to 05/02/2022, new right lower lobe nodule, enlargement of epicardial lymph nodes, slight decrease in size of celiac axis, gastropathic ligament, and portacaval lymphadenopathy, unchanged esophageal thickening -Cycle 8 oxaliplatin and pembrolizumab  08/14/2022, 5-FU and trastuzumab held secondary to potential cardiac toxicity CTs 09/12/2022-preliminary review consistent with disease progression 2.  Myocardial infarction/cardiac arrest 01/31/2022 -Echocardiogram 02/01/2022-LVEF 60-65%, right ventricle severely dilated with hypokinesis in the basal and midportion, hypokinesis in the apical portion, right ventricular systolic function moderately reduced -Echocardiogram on 05/01/2022, LVEF 50-55%, decreased right ventricular systolic function, moderately enlarged right ventricle -Echocardiogram 08/05/2022-LVEF 45-50%, mildly decreased left ventricular function, moderate reduced right ventricular function 3.  Microcytic anemia -Labs from 02/11/2022-ferritin 151, iron 13, percent saturation 5% 4.  DVT/pulmonary embolism-bilateral lower extremity DVTs 01/10/2022, pulmonary emboli on chest CT 01/31/2022-apixaban 5.  PAD status post AKA in 2021 6.  RA 7.  Bilateral pleural effusions -Ultrasound-guided thoracentesis performed on 02/11/2022.  Cytology negative for malignancy. 8.  COPD 9.  Admission 03/11/2022 with E. coli bacteremia Ultrasound-guided biopsy/aspiration of dominant necrotic appearing liver lesion 03/17/2022-scant fluid aspirated-negative culture, no organisms or white cells.  Pathology with necrotic tissue. 10.  Admission 04/29/2022 after a fall, multiple skull fractures 11.  Fever, hypotension-Klebsiella bacteremia on blood culture 04/30/2022 12.  Admission 04/09/2022 with chest pain, NSTEMI 13.  Admission 08/04/2022 with ventricular tachycardia, status post cardioversion, noted to have hypokalemia and hypomagnesemia, started on amiodarone    Disposition: [name] appears stable.  He has most recently been treated with oxaliplatin and Pembrolizumab.  5-FU and trastuzumab have been held due to potential cardiac toxicity.  He had restaging CT scans yesterday.  A final report has not been issued.  Per our review the scan is consistent with disease progression.  We reviewed results/images with [name] [name] at today's visit.  He understands current treatment will be discontinued.  Dr. Benay Spice discussed treatment with Taxol/ramucirumab on a 2-week schedule with him.  We discussed potential side effects associated with Taxol including an allergic reaction, bone marrow toxicity, nausea, mouth sores, neuropathy, arthralgias, constipation or diarrhea.  We reviewed potential side effects associated with ramucirumab including an allergic reaction, hypertension, proteinuria, bleeding, delayed wound healing, bowel perforation, PRES, increased risk of blood clots and strokes.  He agrees to proceed.  He understands the rationale for dexamethasone home premedication.  A prescription was sent to his pharmacy.  We discussed an upper endoscopy as this could not be done initially due to a recent cardiac event.  We will ask GI if he is a candidate to have this done at present to confirm an esophagus primary.  He will return for cycle 1 in 1 week.  We will see him prior to cycle 2 in 3 weeks.  Patient seen with Dr. Benay Spice.    Ned Card ANP/GNP-BC   09/13/2022  8:49 AM  This was a shared visit with Ned Card.  We reviewed the CT images with [name] [name].  There appears to be significant progression of the liver metastases.  Epicardial and abdominal lymph nodes appear stable.  The overall pattern is consistent with progressive disease while on treatment with oxaliplatin, trastuzumab, and pembrolizumab.  This regimen will be discontinued. We discussed treatment options including adding 5-FU to the current systemic therapy regimen (5-FU has been on hold secondary to potential  cardiac toxicity).  We also discussed switching to a second line systemic regimen with paclitaxel/ramucirumab.  He agrees to proceed with paclitaxel/ramucirumab therapy.  We reviewed potential toxicities associated with this regimen he agrees to proceed.  I was present for greater than 50% of today's visit.  I performed medical decision making.  A chemotherapy plan was entered.  Julieanne Manson, MD

## 2022-09-13 NOTE — Progress Notes (Signed)
DISCONTINUE OFF PATHWAY REGIMEN - Other   [id number]:mFOLFOX6 + Trastuzumab 6/4 mg/kg IV q14 Days x 8 Cycles Followed by Trastuzumab 6 mg/kg IV q21 Days:   Cycle 1: A cycle is every 14 days:     Trastuzumab-xxxx      Oxaliplatin      Leucovorin      Fluorouracil      Fluorouracil    Cycles 2 through 8: A cycle is every 14 days:     Trastuzumab-xxxx      Oxaliplatin      Leucovorin      Fluorouracil      Fluorouracil    Cycles 9 and beyond: A cycle is every 21 days:     Trastuzumab-xxxx   **Always confirm dose/schedule in your pharmacy ordering system**  REASON: Disease Progression PRIOR TREATMENT: mFOLFOX6 + Trastuzumab 6/4 mg/kg IV q14 Days x 8 Cycles Followed by Trastuzumab 6 mg/kg IV q21 Days TREATMENT RESPONSE: Stable Disease (SD)  START OFF PATHWAY REGIMEN - Other   [id number]:Paclitaxel 80 mg/m2 IV D1,8,15 + Ramucirumab 8 mg/kg IV D1,15 q28 Days:   A cycle is every 28 days:     Ramucirumab      Paclitaxel   **Always confirm dose/schedule in your pharmacy ordering system**  Patient Characteristics: Intent of Therapy: Non-Curative / Palliative Intent, Discussed with Patient

## 2022-09-14 ENCOUNTER — Other Ambulatory Visit: Payer: Self-pay

## 2022-09-14 ENCOUNTER — Ambulatory Visit: Payer: 59 | Attending: Student | Admitting: Student

## 2022-09-14 DIAGNOSIS — E785 Hyperlipidemia, unspecified: Secondary | ICD-10-CM

## 2022-09-14 DIAGNOSIS — I2699 Other pulmonary embolism without acute cor pulmonale: Secondary | ICD-10-CM

## 2022-09-14 MED ORDER — ATORVASTATIN CALCIUM 40 MG PO TABS: 40.0000 mg | ORAL_TABLET | Freq: Every day | ORAL | 0 refills | Status: AC

## 2022-09-14 MED ORDER — APIXABAN 5 MG PO TABS: 5.0000 mg | ORAL_TABLET | Freq: Two times a day (BID) | ORAL | 0 refills | Status: DC

## 2022-09-17 ENCOUNTER — Other Ambulatory Visit: Payer: Self-pay | Admitting: Oncology

## 2022-09-18 NOTE — Progress Notes (Signed)
Pharmacist Chemotherapy Monitoring - Initial Assessment    Anticipated start date: 09/20/22   The following has been reviewed per standard work regarding the patient's treatment regimen: The patient's diagnosis, treatment plan and drug doses, and organ/hematologic function Lab orders and baseline tests specific to treatment regimen  The treatment plan start date, drug sequencing, and pre-medications Prior authorization status  Patient's documented medication list, including drug-drug interaction screen and prescriptions for anti-emetics and supportive care specific to the treatment regimen The drug concentrations, fluid compatibility, administration routes, and timing of the medications to be used The patient's access for treatment and lifetime cumulative dose history, if applicable  The patient's medication allergies and previous infusion related reactions, if applicable   Changes made to treatment plan:  N/A  Follow up needed:  N/A   Kennith Center, Pharm.D., CPP 09/18/2022@8 :34 AM

## 2022-09-19 ENCOUNTER — Telehealth: Payer: Self-pay | Admitting: *Deleted

## 2022-09-19 DIAGNOSIS — I2510 Atherosclerotic heart disease of native coronary artery without angina pectoris: Secondary | ICD-10-CM | POA: Diagnosis not present

## 2022-09-19 DIAGNOSIS — I739 Peripheral vascular disease, unspecified: Secondary | ICD-10-CM | POA: Diagnosis not present

## 2022-09-19 DIAGNOSIS — E43 Unspecified severe protein-calorie malnutrition: Secondary | ICD-10-CM | POA: Diagnosis not present

## 2022-09-19 DIAGNOSIS — J449 Chronic obstructive pulmonary disease, unspecified: Secondary | ICD-10-CM | POA: Diagnosis not present

## 2022-09-19 DIAGNOSIS — K219 Gastro-esophageal reflux disease without esophagitis: Secondary | ICD-10-CM | POA: Diagnosis not present

## 2022-09-19 DIAGNOSIS — J9601 Acute respiratory failure with hypoxia: Secondary | ICD-10-CM | POA: Diagnosis not present

## 2022-09-19 DIAGNOSIS — Z86718 Personal history of other venous thrombosis and embolism: Secondary | ICD-10-CM | POA: Diagnosis not present

## 2022-09-19 DIAGNOSIS — M069 Rheumatoid arthritis, unspecified: Secondary | ICD-10-CM | POA: Diagnosis not present

## 2022-09-19 DIAGNOSIS — D630 Anemia in neoplastic disease: Secondary | ICD-10-CM | POA: Diagnosis not present

## 2022-09-19 DIAGNOSIS — C771 Secondary and unspecified malignant neoplasm of intrathoracic lymph nodes: Secondary | ICD-10-CM | POA: Diagnosis not present

## 2022-09-19 DIAGNOSIS — M5137 Other intervertebral disc degeneration, lumbosacral region: Secondary | ICD-10-CM | POA: Diagnosis not present

## 2022-09-19 DIAGNOSIS — I10 Essential (primary) hypertension: Secondary | ICD-10-CM | POA: Diagnosis not present

## 2022-09-19 DIAGNOSIS — Z79891 Long term (current) use of opiate analgesic: Secondary | ICD-10-CM | POA: Diagnosis not present

## 2022-09-19 DIAGNOSIS — Z7902 Long term (current) use of antithrombotics/antiplatelets: Secondary | ICD-10-CM | POA: Diagnosis not present

## 2022-09-19 DIAGNOSIS — Z8673 Personal history of transient ischemic attack (TIA), and cerebral infarction without residual deficits: Secondary | ICD-10-CM | POA: Diagnosis not present

## 2022-09-19 DIAGNOSIS — Z89612 Acquired absence of left leg above knee: Secondary | ICD-10-CM | POA: Diagnosis not present

## 2022-09-19 DIAGNOSIS — J439 Emphysema, unspecified: Secondary | ICD-10-CM | POA: Diagnosis not present

## 2022-09-19 DIAGNOSIS — I472 Ventricular tachycardia, unspecified: Secondary | ICD-10-CM | POA: Diagnosis not present

## 2022-09-19 DIAGNOSIS — I252 Old myocardial infarction: Secondary | ICD-10-CM | POA: Diagnosis not present

## 2022-09-19 DIAGNOSIS — Z9181 History of falling: Secondary | ICD-10-CM | POA: Diagnosis not present

## 2022-09-19 DIAGNOSIS — Z8616 Personal history of COVID-19: Secondary | ICD-10-CM | POA: Diagnosis not present

## 2022-09-19 NOTE — Telephone Encounter (Signed)
Agree, thank you

## 2022-09-19 NOTE — Telephone Encounter (Signed)
Received a call from [place] [facility] with [facility]. Stated pt's prosthetic needs to be adjusted; he has lost weight - they are unable to work with pt at this time. She called the [facility] - they need a prescription and office note in which this problem is address and faxed to [phone]. Call transferred to front office to schedule pt an appt. Appt schedule with Dr Dema Severin on 4/3.

## 2022-09-20 ENCOUNTER — Inpatient Hospital Stay: Payer: 59

## 2022-09-20 VITALS — BP 127/81 | HR 81 | Temp 98.2°F | Resp 18 | Ht 67.0 in | Wt 145.0 lb

## 2022-09-20 DIAGNOSIS — I2699 Other pulmonary embolism without acute cor pulmonale: Secondary | ICD-10-CM | POA: Diagnosis not present

## 2022-09-20 DIAGNOSIS — Z7901 Long term (current) use of anticoagulants: Secondary | ICD-10-CM | POA: Diagnosis not present

## 2022-09-20 DIAGNOSIS — J449 Chronic obstructive pulmonary disease, unspecified: Secondary | ICD-10-CM | POA: Diagnosis not present

## 2022-09-20 DIAGNOSIS — C801 Malignant (primary) neoplasm, unspecified: Secondary | ICD-10-CM

## 2022-09-20 DIAGNOSIS — R590 Localized enlarged lymph nodes: Secondary | ICD-10-CM | POA: Diagnosis not present

## 2022-09-20 DIAGNOSIS — C159 Malignant neoplasm of esophagus, unspecified: Secondary | ICD-10-CM

## 2022-09-20 DIAGNOSIS — I252 Old myocardial infarction: Secondary | ICD-10-CM | POA: Diagnosis not present

## 2022-09-20 DIAGNOSIS — R970 Elevated carcinoembryonic antigen [CEA]: Secondary | ICD-10-CM | POA: Diagnosis not present

## 2022-09-20 DIAGNOSIS — Z86718 Personal history of other venous thrombosis and embolism: Secondary | ICD-10-CM | POA: Diagnosis not present

## 2022-09-20 DIAGNOSIS — C787 Secondary malignant neoplasm of liver and intrahepatic bile duct: Secondary | ICD-10-CM | POA: Diagnosis not present

## 2022-09-20 DIAGNOSIS — Z5111 Encounter for antineoplastic chemotherapy: Secondary | ICD-10-CM | POA: Diagnosis not present

## 2022-09-20 DIAGNOSIS — D509 Iron deficiency anemia, unspecified: Secondary | ICD-10-CM | POA: Diagnosis not present

## 2022-09-20 DIAGNOSIS — Z86711 Personal history of pulmonary embolism: Secondary | ICD-10-CM | POA: Diagnosis not present

## 2022-09-20 LAB — CMP (CANCER CENTER ONLY)
ALT: 30 U/L (ref 0–44)
AST: 29 U/L (ref 15–41)
Albumin: 3.9 g/dL (ref 3.5–5.0)
Alkaline Phosphatase: 243 U/L — ABNORMAL HIGH (ref 38–126)
Anion gap: 12 (ref 5–15)
BUN: 16 mg/dL (ref 8–23)
CO2: 22 mmol/L (ref 22–32)
Calcium: 9.6 mg/dL (ref 8.9–10.3)
Chloride: 101 mmol/L (ref 98–111)
Creatinine: 0.77 mg/dL (ref 0.61–1.24)
GFR, Estimated: >60 mL/min (ref >60)
Glucose, Bld: 218 mg/dL — ABNORMAL HIGH (ref 70–99)
Potassium: 4.3 mmol/L (ref 3.5–5.1)
Sodium: 135 mmol/L (ref 135–145)
Total Bilirubin: 0.5 mg/dL (ref 0.3–1.2)
Total Protein: 7.5 g/dL (ref 6.5–8.1)

## 2022-09-20 LAB — CBC WITH DIFFERENTIAL (CANCER CENTER ONLY)
Abs Immature Granulocytes: 0.01 10*3/uL (ref 0.00–0.07)
Basophils Absolute: 0 10*3/uL (ref 0.0–0.1)
Basophils Relative: 0 %
Eosinophils Absolute: 0 10*3/uL (ref 0.0–0.5)
Eosinophils Relative: 0 %
HCT: 38.6 % — ABNORMAL LOW (ref 39.0–52.0)
Hemoglobin: 12.3 g/dL — ABNORMAL LOW (ref 13.0–17.0)
Immature Granulocytes: 0 %
Lymphocytes Relative: 8 %
Lymphs Abs: 0.4 10*3/uL — ABNORMAL LOW (ref 0.7–4.0)
MCH: 29.1 pg (ref 26.0–34.0)
MCHC: 31.9 g/dL (ref 30.0–36.0)
MCV: 91.5 fL (ref 80.0–100.0)
Monocytes Absolute: 0 10*3/uL — ABNORMAL LOW (ref 0.1–1.0)
Monocytes Relative: 1 %
Neutro Abs: 4.3 10*3/uL (ref 1.7–7.7)
Neutrophils Relative %: 91 %
Platelet Count: 154 10*3/uL (ref 150–400)
RBC: 4.22 MIL/uL (ref 4.22–5.81)
RDW: 18.1 % — ABNORMAL HIGH (ref 11.5–15.5)
WBC Count: 4.7 10*3/uL (ref 4.0–10.5)
nRBC: 0 % (ref 0.0–0.2)

## 2022-09-20 LAB — CEA (ACCESS): CEA (CHCC): 791.51 ng/mL — ABNORMAL HIGH (ref 0.00–5.00)

## 2022-09-20 LAB — TOTAL PROTEIN, URINE DIPSTICK: Protein, ur: NEGATIVE mg/dL

## 2022-09-20 MED ORDER — SODIUM CHLORIDE 0.9 % IV SOLN: Freq: Once | INTRAVENOUS | Status: AC

## 2022-09-20 MED ORDER — SODIUM CHLORIDE 0.9 % IV SOLN
80.0000 mg/m2 | Freq: Once | INTRAVENOUS | Status: AC
  Administered 2022-09-20: 138 mg via INTRAVENOUS
  Filled 2022-09-20: qty 23

## 2022-09-20 MED ORDER — HEPARIN SOD (PORK) LOCK FLUSH 100 UNIT/ML IV SOLN
500.0000 [IU] | Freq: Once | INTRAVENOUS | Status: AC | PRN
  Administered 2022-09-20: 500 [IU]

## 2022-09-20 MED ORDER — SODIUM CHLORIDE 0.9 % IV SOLN
8.0000 mg/kg | Freq: Once | INTRAVENOUS | Status: AC
  Administered 2022-09-20: 500 mg via INTRAVENOUS
  Filled 2022-09-20: qty 50

## 2022-09-20 MED ORDER — FAMOTIDINE IN NACL 20-0.9 MG/50ML-% IV SOLN
20.0000 mg | Freq: Once | INTRAVENOUS | Status: AC
  Administered 2022-09-20: 20 mg via INTRAVENOUS
  Filled 2022-09-20: qty 50

## 2022-09-20 MED ORDER — DIPHENHYDRAMINE HCL 50 MG/ML IJ SOLN
50.0000 mg | Freq: Once | INTRAMUSCULAR | Status: AC
  Administered 2022-09-20: 50 mg via INTRAVENOUS
  Filled 2022-09-20: qty 1

## 2022-09-20 MED ORDER — SODIUM CHLORIDE 0.9% FLUSH
10.0000 mL | INTRAVENOUS | Status: DC | PRN
  Administered 2022-09-20: 10 mL

## 2022-09-20 MED ORDER — SODIUM CHLORIDE 0.9 % IV SOLN
10.0000 mg | Freq: Once | INTRAVENOUS | Status: AC
  Administered 2022-09-20: 10 mg via INTRAVENOUS
  Filled 2022-09-20: qty 10

## 2022-09-20 NOTE — Patient Instructions (Signed)
[facility]   Discharge Instructions: Thank you for choosing [facility] to provide your oncology and hematology care.   If you have a lab appointment with the [facility], please go directly to the [facility] and check in at the registration area.   Wear comfortable clothing and clothing appropriate for easy access to any Portacath or PICC line.   We strive to give you quality time with your provider. You may need to reschedule your appointment if you arrive late (15 or more minutes).  Arriving late affects you and other patients whose appointments are after yours.  Also, if you miss three or more appointments without notifying the office, you may be dismissed from the clinic at the provider's discretion.      For prescription refill requests, have your pharmacy contact our office and allow 72 hours for refills to be completed.    Today you received the following chemotherapy and/or immunotherapy agents Cyramza, Paclitaxel.      To help prevent nausea and vomiting after your treatment, we encourage you to take your nausea medication as directed.  BELOW ARE SYMPTOMS THAT SHOULD BE REPORTED IMMEDIATELY: *FEVER GREATER THAN 100.4 F (38 C) OR HIGHER *CHILLS OR SWEATING *NAUSEA AND VOMITING THAT IS NOT CONTROLLED WITH YOUR NAUSEA MEDICATION *UNUSUAL SHORTNESS OF BREATH *UNUSUAL BRUISING OR BLEEDING *URINARY PROBLEMS (pain or burning when urinating, or frequent urination) *BOWEL PROBLEMS (unusual diarrhea, constipation, pain near the anus) TENDERNESS IN MOUTH AND THROAT WITH OR WITHOUT PRESENCE OF ULCERS (sore throat, sores in mouth, or a toothache) UNUSUAL RASH, SWELLING OR PAIN  UNUSUAL VAGINAL DISCHARGE OR ITCHING   Items with * indicate a potential emergency and should be followed up as soon as possible or go to the Emergency Department if any problems should occur.  Please show the CHEMOTHERAPY ALERT CARD or IMMUNOTHERAPY ALERT  CARD at check-in to the Emergency Department and triage nurse.  Should you have questions after your visit or need to cancel or reschedule your appointment, please contact [facility]  Dept: [phone]  and follow the prompts.  Office hours are 8:00 a.m. to 4:30 p.m. Monday - Friday. Please note that voicemails left after 4:00 p.m. may not be returned until the following business day.  We are closed weekends and major holidays. You have access to a nurse at all times for urgent questions. Please call the main number to the clinic Dept: [phone] and follow the prompts.   For any non-urgent questions, you may also contact your provider using MyChart. We now offer e-Visits for anyone 47 and older to request care online for non-urgent symptoms. For details visit [email or web address].   Also download the MyChart app! Go to the app store, search "MyChart", open the app, select [name], and log in with your MyChart username and password.  Ramucirumab Injection What is this medication? RAMUCIRUMAB (ra mue SIR ue mab) treats some types of cancer. It works by blocking a protein that causes cancer cells to grow and multiply. This helps to slow or stop the spread of cancer cells. It is a monoclonal antibody. This medicine may be used for other purposes; ask your health care provider or pharmacist if you have questions. COMMON BRAND NAME(S): Cyramza What should I tell my care team before I take this medication? They need to know if you have any of these conditions: Blood clots Having or recent surgery Heart attack High blood pressure History of  a tear in your stomach or intestines Liver disease Protein in your urine Stomach bleeding Stroke Thyroid disease An unusual or allergic reaction to ramucirumab, other medications, foods, dyes, or preservatives Pregnant or trying to get pregnant Breast-feeding How should I use this medication? This medication is  injected into a vein. It is given by your care team in a hospital or clinic setting. Talk to your care team about the use of this medication in children. Special care may be needed. Overdosage: If you think you have taken too much of this medicine contact a poison control center or emergency room at once. NOTE: This medicine is only for you. Do not share this medicine with others. What if I miss a dose? Keep appointments for follow-up doses. It is important not to miss your dose. Call your care team if you are unable to keep an appointment. What may interact with this medication? Interactions have not been studied. This list may not describe all possible interactions. Give your health care provider a list of all the medicines, herbs, non-prescription drugs, or dietary supplements you use. Also tell them if you smoke, drink alcohol, or use illegal drugs. Some items may interact with your medicine. What should I watch for while using this medication? Your condition will be monitored carefully while you are receiving this medication. You may need blood work while taking this medication. This medication may make you feel generally unwell. This is not uncommon as chemotherapy can affect health cells as well as cancer cells. Report any side effects. Continue your course of treatment even though you feel ill unless your care team tells you to stop. This medication may increase your risk to bruise or bleed. Call your care team if you notice any unusual bleeding. Before having surgery, talk to your care team to make sure it is ok. This medication can increase the risk of poor healing of your surgical site or wound. You will need to stop this medication for 28 days before surgery. After surgery, wait at least 2 weeks before restarting this medication. Make sure the surgical site or wound is healed enough before restarting this medication. Talk to your care team if questions. Talk to your care team if you may be  pregnant. Serious birth defects can occur if you take this medication during pregnancy and for 3 months after the last dose. You will need a negative pregnancy test before starting this medication. Contraception is recommended while taking this medication and for 3 months after the last dose. Your care team can help you find the option that works for you. Do not breastfeed while taking this medication and for 2 months after the last dose. This medication may cause infertility. Talk to your care team if you are concerned about your fertility. What side effects may I notice from receiving this medication? Side effects that you should report to your care team as soon as possible: Allergic reactions--skin rash, itching, hives, swelling of the face, lips, tongue, or throat Bleeding--bloody or black, tar-like stools, vomiting blood or brown material that looks like coffee grounds, red or dark brown urine, small red or purple spots on skin, unusual bruising or bleeding Dizziness, loss of balance or coordination, confusion or trouble speaking Heart attack--pain or tightness in the chest, shoulders, arms, or jaw, nausea, shortness of breath, cold or clammy skin, feeling faint or lightheaded Increase in blood pressure Infection--fever, chills, cough, sore throat, wounds that don't heal, pain or trouble when passing urine, general feeling of  discomfort or being unwell Infusion reactions--chest pain, shortness of breath or trouble breathing, feeling faint or lightheaded Kidney injury--decrease in the amount of urine, swelling of the ankles, hands, or feet Liver injury--right upper belly pain, loss of appetite, nausea, light-colored stool, dark yellow or brown urine, yellowing skin or eyes, unusual weakness or fatigue Low thyroid levels (hypothyroidism)--unusual weakness or fatigue, increased sensitivity to cold, constipation, hair loss, dry skin, weight gain, feelings of depression Stomach pain that is severe,  does not go away, or gets worse Stroke--sudden numbness or weakness of the face, arm, or leg, trouble speaking, confusion, trouble walking, loss of balance or coordination, dizziness, severe headache, change in vision Sudden and severe headache, confusion, change in vision, seizures, which may be signs of posterior reversible encephalopathy syndrome (PRES) Side effects that usually do not require medical attention (report to your care team if they continue or are bothersome): Diarrhea Fatigue Stomach pain Swelling of the ankles, hands, or feet This list may not describe all possible side effects. Call your doctor for medical advice about side effects. You may report side effects to FDA at 1-800-FDA-1088. Where should I keep my medication? This medication is given in a hospital or clinic. It will not be stored at home. NOTE: This sheet is a summary. It may not cover all possible information. If you have questions about this medicine, talk to your doctor, pharmacist, or health care provider.  2023 Elsevier/Gold Standard (2021-10-14 00:00:00)  Paclitaxel Injection What is this medication? PACLITAXEL (PAK li TAX el) treats some types of cancer. It works by slowing down the growth of cancer cells. This medicine may be used for other purposes; ask your health care provider or pharmacist if you have questions. COMMON BRAND NAME(S): Onxol, Taxol What should I tell my care team before I take this medication? They need to know if you have any of these conditions: Heart disease Liver disease Low white blood cell levels An unusual or allergic reaction to paclitaxel, other medications, foods, dyes, or preservatives If you or your partner are pregnant or trying to get pregnant Breast-feeding How should I use this medication? This medication is injected into a vein. It is given by your care team in a hospital or clinic setting. Talk to your care team about the use of this medication in children. While  it may be given to children for selected conditions, precautions do apply. Overdosage: If you think you have taken too much of this medicine contact a poison control center or emergency room at once. NOTE: This medicine is only for you. Do not share this medicine with others. What if I miss a dose? Keep appointments for follow-up doses. It is important not to miss your dose. Call your care team if you are unable to keep an appointment. What may interact with this medication? Do not take this medication with any of the following: Live virus vaccines Other medications may affect the way this medication works. Talk with your care team about all of the medications you take. They may suggest changes to your treatment plan to lower the risk of side effects and to make sure your medications work as intended. This list may not describe all possible interactions. Give your health care provider a list of all the medicines, herbs, non-prescription drugs, or dietary supplements you use. Also tell them if you smoke, drink alcohol, or use illegal drugs. Some items may interact with your medicine. What should I watch for while using this medication? Your condition will  be monitored carefully while you are receiving this medication. You may need blood work while taking this medication. This medication may make you feel generally unwell. This is not uncommon as chemotherapy can affect healthy cells as well as cancer cells. Report any side effects. Continue your course of treatment even though you feel ill unless your care team tells you to stop. This medication can cause serious allergic reactions. To reduce the risk, your care team may give you other medications to take before receiving this one. Be sure to follow the directions from your care team. This medication may increase your risk of getting an infection. Call your care team for advice if you get a fever, chills, sore throat, or other symptoms of a cold or flu.  Do not treat yourself. Try to avoid being around people who are sick. This medication may increase your risk to bruise or bleed. Call your care team if you notice any unusual bleeding. Be careful brushing or flossing your teeth or using a toothpick because you may get an infection or bleed more easily. If you have any dental work done, tell your dentist you are receiving this medication. Talk to your care team if you may be pregnant. Serious birth defects can occur if you take this medication during pregnancy. Talk to your care team before breastfeeding. Changes to your treatment plan may be needed. What side effects may I notice from receiving this medication? Side effects that you should report to your care team as soon as possible: Allergic reactions--skin rash, itching, hives, swelling of the face, lips, tongue, or throat Heart rhythm changes--fast or irregular heartbeat, dizziness, feeling faint or lightheaded, chest pain, trouble breathing Increase in blood pressure Infection--fever, chills, cough, sore throat, wounds that don't heal, pain or trouble when passing urine, general feeling of discomfort or being unwell Low blood pressure--dizziness, feeling faint or lightheaded, blurry vision Low red blood cell level--unusual weakness or fatigue, dizziness, headache, trouble breathing Painful swelling, warmth, or redness of the skin, blisters or sores at the infusion site Pain, tingling, or numbness in the hands or feet Slow heartbeat--dizziness, feeling faint or lightheaded, confusion, trouble breathing, unusual weakness or fatigue Unusual bruising or bleeding Side effects that usually do not require medical attention (report to your care team if they continue or are bothersome): Diarrhea Hair loss Joint pain Loss of appetite Muscle pain Nausea Vomiting This list may not describe all possible side effects. Call your doctor for medical advice about side effects. You may report side effects  to FDA at 1-800-FDA-1088. Where should I keep my medication? This medication is given in a hospital or clinic. It will not be stored at home. NOTE: This sheet is a summary. It may not cover all possible information. If you have questions about this medicine, talk to your doctor, pharmacist, or health care provider.  2023 Elsevier/Gold Standard (2021-10-27 00:00:00)

## 2022-09-21 ENCOUNTER — Telehealth: Payer: Self-pay | Admitting: Emergency Medicine

## 2022-09-21 ENCOUNTER — Telehealth: Payer: Self-pay | Admitting: *Deleted

## 2022-09-21 NOTE — Telephone Encounter (Signed)
-----   Message from Horris Latino, [place] sent at 09/20/2022  9:20 AM EDT -----  ----- Message ----- From: Thornton Park, MD Sent: 09/17/2022   2:55 PM EDT To: Timothy Lasso, RN  Please arrange office appointment with me or any APP - whoever has the first available appointment.  Thanks.  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  He was unable to have an endoscopy initially due to cardiac arrest.  We are changing his treatment and wondered if he was a candidate for an upper endoscopy at present to confirm esophagus primary.  Of note, he was admitted in early February with ventricular tachycardia and underwent cardioversion.  Thanks, Lattie Haw NP with Dr Benay Spice

## 2022-09-21 NOTE — Telephone Encounter (Signed)
24 Hour Callback 24 Hour call back post 1st treatment with Cyramza and Taxol.  Called listed number for patient, no answer, number was Joelene Millin (SO) left message for patient to call back if he has any needs or questions.

## 2022-09-21 NOTE — Progress Notes (Deleted)
09/21/2022 JYREN ASCHE [id number] [birth year]  Referring provider: [name], DO Primary GI doctor: {acdocs:[id number]}  ASSESSMENT AND PLAN:   There are no diagnoses linked to this encounter.   Patient Care Team: [name], DO as PCP - General Lovena Le Champ Mungo, MD as PCP - Electrophysiology (Cardiology) Minus Breeding, MD as PCP - Cardiology (Cardiology) Mohammed Kindle, MD as Referring Physician (Pain Medicine) Serafina Mitchell, MD as Consulting Physician (Vascular Surgery) Warden Fillers, MD as Consulting Physician (Ophthalmology) Lin Givens, RN as Oncology Nurse Navigator  HISTORY OF PRESENT ILLNESS: 62 y.o. male with a past medical history of rheumatoid arthritis, peripheral arterial disease status post left AKA, coronary artery disease, history of inferior MI and chronic PE with subsequent cardiac arrest 01/31/2022, on Eliquis and others listed below presents for evaluation of ***.  01/2022 left heart cath showed 100% stenosis of the RCA-treating medically.  Seen in consultation with [facility] gastroenterology 02/12/2022 due to progressive shortness of breath.  Found to have pleural effusion, elevated troponin but no ST changes.  Status postthoracentesis. CTA of abdomen pelvis showed large liver lesion left lobe consistent with malignancy also ill-defined pancreatic head with mild diffuse peripancreatic edema and necrotic lymph node at the gastrohepatic ligament and probable large necrotic node measuring 4.5 x 3.4 cm adjacent to the EG junction where there is also periaortic adenopathy.  Concern for pancreatic adenocarcinoma versus hepatocellular carcinoma metastatic. 02/11/2022 CEA 237, CA 19-9 88 Ultrasound-guided liver biopsy performed 02/15/2022-adenocarcinoma with extensive necrosis, CK7 positive, negative for CK20, CDX2, TTF-1, and PAX8, consistent with a pancreatic, lung, or upper gastrointestinal primary -PET 03/08/2022-hypermetabolic distal esophagus mass,  hypermetabolic centrally necrotic hepatic mass, hypermetabolic periportal nodes, exophytic GE junction mass without hypermetabolism-likely benign lesion, no hypermetabolism in the pancreas head, bilateral hypermetabolic adrenal gland lesions, small hypermetabolic left parotid gland lesion.  04/06/2022 cycle 1 with Dr. Ammie Dalton FOLFOX, trastuzumab, Pembrolizumab for palliative chemotherapy 04/25/2022 Pembrolizumab other ones held due to possible coronary vasospasm. Patient had 8 cycles total 09/12/2022 CT reviewed consistent with disease progression Patient referred from Dr. Ammie Dalton for evaluation of EGD for possible esophageal primary.  He  reports that he quit smoking about 3 years ago. His smoking use included cigarettes. He has a 5.00 pack-year smoking history. He has never used smokeless tobacco. He reports that he does not drink alcohol and does not use drugs.  RELEVANT LABS AND IMAGING: CBC    Component Value Date/Time   WBC 4.7 09/20/2022 0919   WBC 5.7 08/08/2022 0718   RBC 4.22 09/20/2022 0919   HGB 12.3 (L) 09/20/2022 0919   HGB 11.4 (L) 06/15/2022 1458   HCT 38.6 (L) 09/20/2022 0919   HCT 36.1 (L) 06/15/2022 1458   PLT 154 09/20/2022 0919   PLT 133 (L) 06/15/2022 1458   MCV 91.5 09/20/2022 0919   MCV 84 06/15/2022 1458   MCH 29.1 09/20/2022 0919   MCHC 31.9 09/20/2022 0919   RDW 18.1 (H) 09/20/2022 0919   RDW 19.4 (H) 06/15/2022 1458   LYMPHSABS 0.4 (L) 09/20/2022 0919   LYMPHSABS 1.1 06/15/2022 1458   MONOABS 0.0 (L) 09/20/2022 0919   EOSABS 0.0 09/20/2022 0919   EOSABS 0.2 06/15/2022 1458   BASOSABS 0.0 09/20/2022 0919   BASOSABS 0.1 06/15/2022 1458   Recent Labs    07/17/22 0943 08/04/22 1824 08/05/22 0443 08/06/22 0445 08/07/22 1045 08/08/22 0718 08/14/22 0832 08/30/22 0910 09/13/22 0828 09/20/22 0919  HGB 10.8* 12.5* 10.5* 9.9* 10.7* 10.9* 11.9* 11.2* 12.0* 12.3*  CMP     Component Value Date/Time   NA 135 09/20/2022 0919   NA 135 08/16/2022  1524   K 4.3 09/20/2022 0919   CL 101 09/20/2022 0919   CO2 22 09/20/2022 0919   GLUCOSE 218 (H) 09/20/2022 0919   BUN 16 09/20/2022 0919   BUN 21 08/16/2022 1524   CREATININE 0.77 09/20/2022 0919   CREATININE 0.80 11/27/2012 0956   CALCIUM 9.6 09/20/2022 0919   PROT 7.5 09/20/2022 0919   PROT 6.9 06/15/2022 1458   ALBUMIN 3.9 09/20/2022 0919   ALBUMIN 3.3 (L) 06/15/2022 1458   AST 29 09/20/2022 0919   ALT 30 09/20/2022 0919   ALKPHOS 243 (H) 09/20/2022 0919   BILITOT 0.5 09/20/2022 0919   GFRNONAA >60 09/20/2022 0919   GFRNONAA >89 11/27/2012 0956   GFRAA >60 01/24/2020 0202   GFRAA >89 11/27/2012 0956      Latest Ref Rng & Units 09/20/2022    9:19 AM 09/13/2022    8:28 AM 08/30/2022    9:10 AM  Hepatic Function  Total Protein 6.5 - 8.1 g/dL 7.5  7.1  6.9   Albumin 3.5 - 5.0 g/dL 3.9  3.9  3.6   AST 15 - 41 U/L 29  41  71   ALT 0 - 44 U/L 30  46  82   Alk Phosphatase 38 - 126 U/L 243  234  206   Total Bilirubin 0.3 - 1.2 mg/dL 0.5  0.4  0.3       Current Medications:   Current Outpatient Medications (Endocrine & Metabolic):    dexamethasone (DECADRON) 2 MG tablet, Take 5 tabs 10pm night prior to first chemo and 6am morning of first chemo  Current Outpatient Medications (Cardiovascular):    amiodarone (PACERONE) 200 MG tablet, Take 2 tablets (400 mg total) by mouth 2 (two) times daily for 6 days. Then, (starting on 2/21) take 1 tablet (200 mg total) by mouth 2 (two) times daily.   amLODipine (NORVASC) 10 MG tablet, Take 1 tablet (10 mg total) by mouth daily.   atorvastatin (LIPITOR) 40 MG tablet, Take 1 tablet (40 mg total) by mouth daily at 6 PM.   isosorbide mononitrate (IMDUR) 30 MG 24 hr tablet, Take 1 tablet (30 mg total) by mouth daily.  Current Outpatient Medications (Respiratory):    albuterol (VENTOLIN HFA) 108 (90 Base) MCG/ACT inhaler, Inhale 2 puffs into the lungs every 6 (six) hours as needed for wheezing or shortness of breath. (Patient not taking: Reported  on 08/30/2022)   mometasone-formoterol (DULERA) 100-5 MCG/ACT AERO, Inhale 2 puffs into the lungs 2 (two) times daily.  Current Outpatient Medications (Analgesics):    meloxicam (MOBIC) 7.5 MG tablet, Take 1 tablet (7.5 mg total) by mouth daily as needed for pain.  Current Outpatient Medications (Hematological):    apixaban (ELIQUIS) 5 MG TABS tablet, Take 1 tablet (5 mg total) by mouth 2 (two) times daily.   ferrous sulfate 325 (65 FE) MG tablet, Take 1 tablet (325 mg total) by mouth daily with breakfast for 100 doses.   folic acid (FOLVITE) 1 MG tablet, Take 1 tablet (1 mg total) by mouth daily.  Current Outpatient Medications (Other):    diclofenac Sodium (VOLTAREN) 1 % GEL, Apply 2 g topically 4 (four) times daily.   DULoxetine (CYMBALTA) 30 MG capsule, Take 30 mg by mouth daily as needed (sleep).   feeding supplement (ENSURE ENLIVE / ENSURE PLUS) LIQD, Take 237 mLs by mouth 3 (three) times daily between  meals.   gabapentin (NEURONTIN) 100 MG capsule, Take 1 capsule (100 mg total) by mouth See admin instructions. Daily or twice daily if tolerated. (Patient taking differently: Take 100-200 mg by mouth daily.)   KLOR-CON M10 10 MEQ tablet, TAKE 2 TABLETS(20 MEQ) BY MOUTH DAILY   lidocaine-prilocaine (EMLA) cream, Apply 1 Application topically as directed. Apply to port site 2 hours prior to stick and cover with Press-and-Seal to numb port before access   methotrexate (RHEUMATREX) 2.5 MG tablet, Take 3 tablets (7.5 mg total) by mouth once a week. Caution:Chemotherapy. Protect from light.   ondansetron (ZOFRAN) 8 MG tablet, TAKE 1 TABLET(8 MG) BY MOUTH EVERY 8 HOURS AS NEEDED FOR NAUSEA OR VOMITING  Medical History:  Past Medical History:  Diagnosis Date   Acute respiratory failure ([place]) 01/31/2022   Bilateral shoulder pain 08/20/2012   Bite wound of left hand 09/27/2021   CAD (coronary artery disease)    Occluded nondominent RCA managed medically - August 2023   Cardiac arrest [facility])     OOH VF - August 2023   Community acquired pneumonia 11/07/2011   Critical limb ischemia with history of revascularization of same extremity ([place]) 04/28/2020   DDD (degenerative disc disease), lumbosacral     and grade 2 slip   DVT (deep venous thrombosis) ([place]) 01/31/2022   Esophageal cancer (HCC)    Adenocarcinoma with metastasis   GERD (gastroesophageal reflux disease)    History of anemia    History of COVID-19 05/10/2019   History of critical lower limb ischemia 02/19/2019   Hypertension    Impingement syndrome of shoulder region    Ischemic ulcer of toe of left foot (HCC)    Liver mass    Low back pain without sciatica    Lupus ([place])    Multiple fractures of ribs, bilateral, due to CPR trauma    Onychomycosis    Paronychia of great toe    Peripheral vascular disease (HCC)    Pressure injury of skin    Pulmonary embolus (HCC)    RA (rheumatoid arthritis) ([place])    Rotator cuff tear 11/04/2014   Sternal fracture    Stroke ([place])    Allergies:  Allergies  Allergen Reactions   Dilaudid [Hydromorphone Hcl] Rash   Ramipril Rash and Other (See Comments)    Per patient on R arm and leg only; no angioedema   Tramadol Itching and Rash     Surgical History:  He  has a past surgical history that includes Video assisted thoracoscopy (vats)/decortication (Right, 11/21/2011); Achilles tendon surgery (Bilateral); Osteotomy and ulnar shortening (Right, 07/22/2002); Shoulder arthroscopy with rotator cuff repair and subacromial decompression (Left, 11/19/2014); Shoulder arthroscopy with distal clavicle resection (Left, 11/19/2014); Shoulder arthroscopy with bicepstenotomy (Right, 03/11/2015); LEFT HEART CATH AND CORONARY ANGIOGRAPHY (N/A, 03/15/2017); Transforaminal lumbar interbody fusion (tlif) with pedicle screw fixation 1 level (N/A, 01/03/2018); Abdominal aortagram (02/20/2019); ABDOMINAL AORTOGRAM W/LOWER EXTREMITY (N/A, 02/20/2019); Femoral-popliteal Bypass Graft (Left, 02/26/2019);  Intraoperative arteriogram (Left, 01/22/2020); Ultrasound guidance for vascular access (Right, 01/22/2020); PERIPHERAL VASCULAR THROMBECTOMY (N/A, 01/23/2020); PERIPHERAL VASCULAR ATHERECTOMY (01/23/2020); PERIPHERAL VASCULAR BALLOON ANGIOPLASTY (01/23/2020); LOWER EXTREMITY INTERVENTION (Left, 04/29/2020); Thrombectomy of bypass graft femoral- popliteal artery (Left, 05/01/2020); Patch angioplasty (Left, 05/01/2020); Fasciotomy (Left, 05/01/2020); Endarterectomy popliteal (Left, 05/01/2020); PERIPHERAL VASCULAR THROMBECTOMY (N/A, 04/30/2020); PERIPHERAL VASCULAR INTERVENTION (Left, 04/30/2020); Amputation (Left, 05/11/2020); LEFT HEART CATH AND CORONARY ANGIOGRAPHY (N/A, 02/06/2022); and IR IMAGING GUIDED PORT INSERTION (03/07/2022). Family History:  His family history includes Alcohol abuse in his father; Aneurysm in his mother;  Heart attack (age of onset: 28) in his father.  REVIEW OF SYSTEMS  : All other systems reviewed and negative except where noted in the History of Present Illness.  PHYSICAL EXAM: There were no vitals taken for this visit. General Appearance: Well nourished, in no apparent distress. Head:   Normocephalic and atraumatic. Eyes:  sclerae anicteric,conjunctive pink  Respiratory: Respiratory effort normal, BS equal bilaterally without rales, rhonchi, wheezing. Cardio: RRR with no MRGs. Peripheral pulses intact.  Abdomen: Soft,  {BlankSingle:19197::"Flat","Obese","Non-distended"} ,active bowel sounds. {actendernessAB:27319} tenderness {anatomy; site abdomen:5010}. {BlankMultiple:19196::"Without guarding","With guarding","Without rebound","With rebound"}. No masses. Rectal: {acrectalexam:27461} Musculoskeletal: Full ROM, {PSY - GAIT AND STATION:22860} gait. {With/Without:304960234} edema. Skin:  Dry and intact without significant lesions or rashes Neuro: Alert and  oriented x4;  No focal deficits. Psych:  Cooperative. Normal mood and affect.    Vladimir Crofts, PA-C 1:29 PM

## 2022-09-21 NOTE — Telephone Encounter (Signed)
Patient scheduled with Vicie Mutters, PA on Monday 09/25/22 @ 2:30 pm.

## 2022-09-22 ENCOUNTER — Other Ambulatory Visit: Payer: Self-pay

## 2022-09-25 ENCOUNTER — Ambulatory Visit: Payer: 59 | Admitting: Physician Assistant

## 2022-09-26 ENCOUNTER — Other Ambulatory Visit: Payer: Self-pay

## 2022-09-26 NOTE — Telephone Encounter (Signed)
Pt contact Crissie Reese (Listed in [place] as a contact) in regards to what heart medicine the patient should be taking.  Refill Request  amiodarone (PACERONE) 200 MG tablet   Pt has been waiting for the following medicine as well.  isosorbide mononitrate (IMDUR) 30 MG 24 hr tablet     [place], [place] (Ph: [phone])

## 2022-09-27 ENCOUNTER — Telehealth: Payer: Self-pay | Admitting: *Deleted

## 2022-09-27 ENCOUNTER — Ambulatory Visit (INDEPENDENT_AMBULATORY_CARE_PROVIDER_SITE_OTHER): Payer: 59

## 2022-09-27 VITALS — BP 121/84 | HR 84 | Temp 97.9°F

## 2022-09-27 DIAGNOSIS — S78119A Complete traumatic amputation at level between unspecified hip and knee, initial encounter: Secondary | ICD-10-CM

## 2022-09-27 DIAGNOSIS — I472 Ventricular tachycardia, unspecified: Secondary | ICD-10-CM

## 2022-09-27 DIAGNOSIS — I739 Peripheral vascular disease, unspecified: Secondary | ICD-10-CM | POA: Diagnosis not present

## 2022-09-27 DIAGNOSIS — Z89612 Acquired absence of left leg above knee: Secondary | ICD-10-CM

## 2022-09-27 MED ORDER — AMIODARONE HCL 200 MG PO TABS: 200.0000 mg | ORAL_TABLET | Freq: Two times a day (BID) | ORAL | 11 refills | Status: AC

## 2022-09-27 MED ORDER — AMIODARONE HCL 200 MG PO TABS: 200.0000 mg | ORAL_TABLET | Freq: Two times a day (BID) | ORAL | 0 refills | Status: DC

## 2022-09-27 MED ORDER — ISOSORBIDE MONONITRATE ER 30 MG PO TB24: 30.0000 mg | ORAL_TABLET | Freq: Every day | ORAL | 0 refills | Status: DC

## 2022-09-27 MED ORDER — MOMETASONE FURO-FORMOTEROL FUM 100-5 MCG/ACT IN AERO: 2.0000 | INHALATION_SPRAY | Freq: Two times a day (BID) | RESPIRATORY_TRACT | 0 refills | Status: DC

## 2022-09-27 NOTE — Progress Notes (Unsigned)
CC: AWV.  HPI:  [name] is a 62 y.o. with past medical history as below who presents for AWV. Please see detailed assessment and plan for HPI.  Past Medical History:  Diagnosis Date   Acute respiratory failure 01/31/2022   Bilateral shoulder pain 08/20/2012   Bite wound of left hand 09/27/2021   CAD (coronary artery disease)    Occluded nondominent RCA managed medically - August 2023   Cardiac arrest    OOH VF - August 2023   Community acquired pneumonia 11/07/2011   Critical limb ischemia with history of revascularization of same extremity 04/28/2020   DDD (degenerative disc disease), lumbosacral     and grade 2 slip   DVT (deep venous thrombosis) 01/31/2022   Esophageal cancer    Adenocarcinoma with metastasis   GERD (gastroesophageal reflux disease)    History of anemia    History of COVID-19 05/10/2019   History of critical lower limb ischemia 02/19/2019   Hypertension    Impingement syndrome of shoulder region    Ischemic ulcer of toe of left foot    Liver mass    Low back pain without sciatica    Lupus    Multiple fractures of ribs, bilateral, due to CPR trauma    Onychomycosis    Paronychia of great toe    Peripheral vascular disease    Pressure injury of skin    Pulmonary embolus    RA (rheumatoid arthritis)    Rotator cuff tear 11/04/2014   Sternal fracture    Stroke    Review of Systems:  See detailed assessment and plan for pertinent ROS.  Physical Exam:  Vitals:   09/27/22 1433 09/27/22 1528  BP: 97/70 121/84  Pulse: 83 84  Temp: 97.9 F (36.6 C)   TempSrc: Oral   SpO2: 100%    Physical Exam Constitutional:      General: He is not in acute distress.    Comments: Presents in wheelchair  HENT:     Head: Normocephalic and atraumatic.  Eyes:     Extraocular Movements: Extraocular movements intact.  Cardiovascular:     Rate and Rhythm: Normal rate and regular rhythm.     Heart sounds: No murmur heard. Pulmonary:     Effort:  Pulmonary effort is normal.     Breath sounds: No wheezing, rhonchi or rales.  Musculoskeletal:     Right lower leg: No edema.     Comments: Left AKA  Skin:    General: Skin is warm and dry.  Neurological:     Mental Status: He is alert and oriented to person, place, and time.  Psychiatric:        Mood and Affect: Mood normal.        Behavior: Behavior normal.      Assessment & Plan:   See Encounters Tab for problem based charting.  Ventricular tachycardia ([place]) Patient with history of persistent tach.  This is thought to be due to hypokalemia and hypomagnesemia in the setting of poor p.o. intake 2/2 chemotherapy.  He is on a new regimen and denies any difficulty tolerating p.o. intake.  He denies any chest pain, palpitations, or shortness of breath today. It appears cardiology is considering ICD placement pending course of his adenocarcinoma. -Refill amiodarone 200 mg twice daily -Follow-up with cardiology tomorrow  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), left [facility]) Patient requests referral for left lower extremity prosthesis replacement/repair.  He reports he is unable to use his  currently.  States that prosthesis no longer fits after weight loss. -Referral to [employer]    Patient discussed with Dr. Philipp [name]

## 2022-09-27 NOTE — Telephone Encounter (Signed)
Pharm Tech from [employer] is requesting a new Rx for Amiodarone as the Sig states beginning 2/21 take 1 tab BID.

## 2022-09-27 NOTE — Progress Notes (Signed)
  Electrophysiology Office Note:   Date:  09/28/2022  ID:  MARTEN MARCHAK, DOB 1961-03-08, MRN [id number]  Primary Cardiologist: Minus Breeding, MD Electrophysiologist: Cristopher Peru, MD   History of Present Illness:   [name] [name] is a 62 y.o. male with h/o CAD, h/o DVT/PE, Metastatic adenocarcinoma, s/p left AKA, Chronic anemia, and h/o VT arrest in setting of STEMI  seen today for routine electrophysiology followup.   Since last being seen in our clinic the patient reports doing well overall. Denies lightheadedness, dizziness, or syncope. He has mild SOB with more than moderate exertion. Denies edema. He feels like his cancer therapy is going well and he and family are optimistic.   Review of systems complete and found to be negative unless listed in HPI.   Studies Reviewed:    EKG is not ordered today  Risk Assessment/Calculations:    Physical Exam:   VS:  BP 108/60   Pulse 86   Ht 5\' 7"  (1.702 m)   Wt 151 lb (68.5 kg)   SpO2 98%   BMI 23.65 kg/m    Wt Readings from Last 3 Encounters:  09/28/22 151 lb (68.5 kg)  09/20/22 145 lb (65.8 kg)  09/13/22 139 lb 3.2 oz (63.1 kg)     GEN: Well nourished, well developed in no acute distress NECK: No JVD; No carotid bruits CARDIAC: Regular rate and rhythm, no murmurs, rubs, gallops RESPIRATORY:  Clear to auscultation without rales, wheezing or rhonchi  ABDOMEN: Soft, non-tender, non-distended EXTREMITIES:  No edema; No deformity   ASSESSMENT AND PLAN:   Ventricular tachycardia, Monomorphic Previously occurred in setting of STEMI with recent recurrence.  Not currently ICD candidate with R chest port, In addition had potentially reversible cause in setting of severe electrolyte dysfunction Continue amiodarone 200 mg BID. Will see back in 3 months, and pending his course could consider potentially going down to once daily at that time.  He would potentially be an ICD candidate in the future pending the course of his adenocarcinoma and  if he were to have further VT without reversible cause.    Chronic systolic CHF EF [id number].  No Showed for Gen Cards visit  BP remains soft today. Will reschedule gen cards visit.   Esophageal Adenocarcinoma By his report, he has 2 additional chemo sessions and then further imaging.   Follow up with EP APP in 3 months  Signed, Shirley Friar, PA-C

## 2022-09-27 NOTE — Patient Instructions (Signed)
[name], it was a pleasure seeing you today!  Today we discussed: Referral placed to [employer] for prosthesis. Refills sent for Imdur, Dulera, and amiodarone.  Please attend appointments with oncology and cardiology.  I have ordered the following labs today:  Lab Orders  No laboratory test(s) ordered today     Tests ordered today:  none  Referrals ordered today:    Referral Orders         Ambulatory Referral for DME      I have ordered the following medication/changed the following medications:   Stop the following medications: Medications Discontinued During This Encounter  Medication Reason   mometasone-formoterol (DULERA) 100-5 MCG/ACT AERO Reorder   amiodarone (PACERONE) 200 MG tablet Reorder   isosorbide mononitrate (IMDUR) 30 MG 24 hr tablet Reorder     Start the following medications: Meds ordered this encounter  Medications   amiodarone (PACERONE) 200 MG tablet    Sig: Take 2 tablets (400 mg total) by mouth 2 (two) times daily for 6 days. Then, (starting on 2/21) take 1 tablet (200 mg total) by mouth 2 (two) times daily.    Dispense:  84 tablet    Refill:  0    combined with [id number]   mometasone-formoterol (DULERA) 100-5 MCG/ACT AERO    Sig: Inhale 2 puffs into the lungs 2 (two) times daily.    Dispense:  13 g    Refill:  0   isosorbide mononitrate (IMDUR) 30 MG 24 hr tablet    Sig: Take 1 tablet (30 mg total) by mouth daily.    Dispense:  30 tablet    Refill:  0     Follow-up: 3 months   Please make sure to arrive 15 minutes prior to your next appointment. If you arrive late, you may be asked to reschedule.   We look forward to seeing you next time. Please call our clinic at [phone] if you have any questions or concerns. The best time to call is Monday-Friday from 9am-4pm, but there is someone available 24/7. If after hours or the weekend, call the main hospital number and ask for the Internal Medicine Resident On-Call. If you need  medication refills, please notify your pharmacy one week in advance and they will send [place] a request.  Thank you for letting us take part in your care. Wishing you the best!  Thank you, Linward Natal, MD

## 2022-09-28 ENCOUNTER — Ambulatory Visit: Payer: 59 | Attending: Student | Admitting: Student

## 2022-09-28 ENCOUNTER — Encounter: Payer: Self-pay | Admitting: Student

## 2022-09-28 ENCOUNTER — Telehealth: Payer: Self-pay

## 2022-09-28 VITALS — BP 108/60 | HR 86 | Ht 67.0 in | Wt 151.0 lb

## 2022-09-28 DIAGNOSIS — I5022 Chronic systolic (congestive) heart failure: Secondary | ICD-10-CM | POA: Diagnosis not present

## 2022-09-28 DIAGNOSIS — I472 Ventricular tachycardia, unspecified: Secondary | ICD-10-CM

## 2022-09-28 DIAGNOSIS — C159 Malignant neoplasm of esophagus, unspecified: Secondary | ICD-10-CM | POA: Diagnosis not present

## 2022-09-28 NOTE — Assessment & Plan Note (Addendum)
Patient requests referral for left lower extremity prosthesis replacement/repair.  He reports he is unable to use his currently.  States that prosthesis no longer fits after weight loss. -Referral to [employer]

## 2022-09-28 NOTE — Telephone Encounter (Signed)
Prior Authorization for patient [name] [name]) came through on cover my meds was submitted with last office notes awaiting approval or denial

## 2022-09-28 NOTE — Telephone Encounter (Addendum)
Decision: [name] (Key: [id number]) PA Case ID #[id number] Rx #: [id number] Need Help? Call us at [phone] Outcome N/A today This medication or product is on your plan's list of covered drugs. Prior authorization is not required at this time. If your pharmacy has questions regarding the processing of your prescription, please have them call the [facility] pharmacy help desk at [phone]. **Please note: This request was submitted electronically. Formulary lowering, tiering exception, cost reduction and/or pre-benefit determination review (including prospective Medicare hospice reviews) requests cannot be requested using this method of submission. Providers contact us at [phone] for further assistance. Drug Dulera 100-5MCG/ACT aerosol ePA cloud logo Form [facility] Medicare Part D Electronic Prior Authorization Form (2017 NCPDP) [place] [phone] Provide Exception Process Printed Notice   Letter has been faxed to the pharmacy

## 2022-09-28 NOTE — Assessment & Plan Note (Addendum)
Patient with history of persistent tach.  This is thought to be due to hypokalemia and hypomagnesemia in the setting of poor p.o. intake 2/2 chemotherapy.  He is on a new regimen and denies any difficulty tolerating p.o. intake.  He denies any chest pain, palpitations, or shortness of breath today. It appears cardiology is considering ICD placement pending course of his adenocarcinoma. -Refill amiodarone 200 mg twice daily -Follow-up with cardiology tomorrow

## 2022-09-28 NOTE — Patient Instructions (Signed)
Medication Instructions:  Your physician recommends that you continue on your current medications as directed. Please refer to the Current Medication list given to you today.  *If you need a refill on your cardiac medications before your next appointment, please call your pharmacy*   Lab Work: TSH, FreeT4--TODAY If you have labs (blood work) drawn today and your tests are completely normal, you will receive your results only by: [place] (if you have MyChart) OR A paper copy in the mail If you have any lab test that is abnormal or we need to change your treatment, we will call you to review the results.  Follow-Up: At [facility], you and your health needs are our priority.  As part of our continuing mission to provide you with exceptional heart care, we have created designated Provider Care Teams.  These Care Teams include your primary Cardiologist (physician) and Advanced Practice Providers (APPs -  Physician Assistants and Nurse Practitioners) who all work together to provide you with the care you need, when you need it.  We recommend signing up for the patient portal called "MyChart".  Sign up information is provided on this After Visit Summary.  MyChart is used to connect with patients for Virtual Visits (Telemedicine).  Patients are able to view lab/test results, encounter notes, upcoming appointments, etc.  Non-urgent messages can be sent to your provider as well.   To learn more about what you can do with MyChart, go to [email or web address].    Your next appointment:   3 month(s)  Provider:   Beryle Beams" Chalmers Cater, PA-C   Also, schedule follow up appointment with general cardiology.

## 2022-09-28 NOTE — Addendum Note (Signed)
Addended by: Juventino Slovak on: 09/28/2022 10:53 AM   Modules accepted: Orders

## 2022-09-29 ENCOUNTER — Other Ambulatory Visit: Payer: Self-pay

## 2022-09-29 LAB — TSH: TSH: 3.1 u[IU]/mL (ref 0.450–4.500)

## 2022-09-29 LAB — T4, FREE: Free T4: 1.64 ng/dL (ref 0.82–1.77)

## 2022-10-02 NOTE — Progress Notes (Signed)
Internal Medicine Clinic Attending  Case discussed with Dr. White  At the time of the visit.  We reviewed the resident's history and exam and pertinent patient test results.  I agree with the assessment, diagnosis, and plan of care documented in the resident's note.  

## 2022-10-02 NOTE — Addendum Note (Signed)
Addended by: Brayton Baumgartner R on: 10/02/2022 03:00 PM   Modules accepted: Level of Service  

## 2022-10-03 MED ORDER — FLUTICASONE-SALMETEROL 45-21 MCG/ACT IN AERO: 2.0000 | INHALATION_SPRAY | Freq: Two times a day (BID) | RESPIRATORY_TRACT | 12 refills | Status: AC

## 2022-10-03 NOTE — Telephone Encounter (Signed)
You can try that, if he needs another PA  I will do it.

## 2022-10-03 NOTE — Addendum Note (Signed)
Addended by: Adron Bene on: 10/03/2022 09:10 AM   Modules accepted: Orders

## 2022-10-04 ENCOUNTER — Encounter: Payer: Self-pay | Admitting: Nurse Practitioner

## 2022-10-04 ENCOUNTER — Inpatient Hospital Stay: Payer: 59

## 2022-10-04 ENCOUNTER — Inpatient Hospital Stay: Payer: 59 | Attending: Oncology | Admitting: Nurse Practitioner

## 2022-10-04 VITALS — BP 107/78 | HR 98 | Temp 98.1°F | Resp 20 | Ht 67.0 in | Wt 140.0 lb

## 2022-10-04 VITALS — BP 137/94 | HR 85 | Resp 18

## 2022-10-04 DIAGNOSIS — Z8674 Personal history of sudden cardiac arrest: Secondary | ICD-10-CM | POA: Diagnosis not present

## 2022-10-04 DIAGNOSIS — C159 Malignant neoplasm of esophagus, unspecified: Secondary | ICD-10-CM

## 2022-10-04 DIAGNOSIS — Z86711 Personal history of pulmonary embolism: Secondary | ICD-10-CM | POA: Diagnosis not present

## 2022-10-04 DIAGNOSIS — D509 Iron deficiency anemia, unspecified: Secondary | ICD-10-CM | POA: Diagnosis not present

## 2022-10-04 DIAGNOSIS — J449 Chronic obstructive pulmonary disease, unspecified: Secondary | ICD-10-CM | POA: Diagnosis not present

## 2022-10-04 DIAGNOSIS — C787 Secondary malignant neoplasm of liver and intrahepatic bile duct: Secondary | ICD-10-CM | POA: Insufficient documentation

## 2022-10-04 DIAGNOSIS — C801 Malignant (primary) neoplasm, unspecified: Secondary | ICD-10-CM

## 2022-10-04 DIAGNOSIS — Z5111 Encounter for antineoplastic chemotherapy: Secondary | ICD-10-CM | POA: Diagnosis not present

## 2022-10-04 DIAGNOSIS — I517 Cardiomegaly: Secondary | ICD-10-CM | POA: Diagnosis not present

## 2022-10-04 DIAGNOSIS — Z79899 Other long term (current) drug therapy: Secondary | ICD-10-CM | POA: Insufficient documentation

## 2022-10-04 DIAGNOSIS — I252 Old myocardial infarction: Secondary | ICD-10-CM | POA: Insufficient documentation

## 2022-10-04 LAB — CMP (CANCER CENTER ONLY)
ALT: 122 U/L — ABNORMAL HIGH (ref 0–44)
AST: 59 U/L — ABNORMAL HIGH (ref 15–41)
Albumin: 3.3 g/dL — ABNORMAL LOW (ref 3.5–5.0)
Alkaline Phosphatase: 443 U/L — ABNORMAL HIGH (ref 38–126)
Anion gap: 8 (ref 5–15)
BUN: 11 mg/dL (ref 8–23)
CO2: 26 mmol/L (ref 22–32)
Calcium: 9.2 mg/dL (ref 8.9–10.3)
Chloride: 106 mmol/L (ref 98–111)
Creatinine: 0.81 mg/dL (ref 0.61–1.24)
GFR, Estimated: >60 mL/min (ref >60)
Glucose, Bld: 102 mg/dL — ABNORMAL HIGH (ref 70–99)
Potassium: 3.4 mmol/L — ABNORMAL LOW (ref 3.5–5.1)
Sodium: 140 mmol/L (ref 135–145)
Total Bilirubin: 0.5 mg/dL (ref 0.3–1.2)
Total Protein: 6.4 g/dL — ABNORMAL LOW (ref 6.5–8.1)

## 2022-10-04 LAB — CBC WITH DIFFERENTIAL (CANCER CENTER ONLY)
Abs Immature Granulocytes: 0 10*3/uL (ref 0.00–0.07)
Basophils Absolute: 0 10*3/uL (ref 0.0–0.1)
Basophils Relative: 1 %
Eosinophils Absolute: 0.1 10*3/uL (ref 0.0–0.5)
Eosinophils Relative: 3 %
HCT: 37.5 % — ABNORMAL LOW (ref 39.0–52.0)
Hemoglobin: 11.8 g/dL — ABNORMAL LOW (ref 13.0–17.0)
Immature Granulocytes: 0 %
Lymphocytes Relative: 28 %
Lymphs Abs: 0.9 10*3/uL (ref 0.7–4.0)
MCH: 28.7 pg (ref 26.0–34.0)
MCHC: 31.5 g/dL (ref 30.0–36.0)
MCV: 91.2 fL (ref 80.0–100.0)
Monocytes Absolute: 0.6 10*3/uL (ref 0.1–1.0)
Monocytes Relative: 17 %
Neutro Abs: 1.7 10*3/uL (ref 1.7–7.7)
Neutrophils Relative %: 51 %
Platelet Count: 144 10*3/uL — ABNORMAL LOW (ref 150–400)
RBC: 4.11 MIL/uL — ABNORMAL LOW (ref 4.22–5.81)
RDW: 18.3 % — ABNORMAL HIGH (ref 11.5–15.5)
WBC Count: 3.3 10*3/uL — ABNORMAL LOW (ref 4.0–10.5)
nRBC: 0 % (ref 0.0–0.2)

## 2022-10-04 MED ORDER — SODIUM CHLORIDE 0.9 % IV SOLN
8.0000 mg/kg | Freq: Once | INTRAVENOUS | Status: AC
  Administered 2022-10-04: 500 mg via INTRAVENOUS
  Filled 2022-10-04: qty 50

## 2022-10-04 MED ORDER — SODIUM CHLORIDE 0.9% FLUSH
10.0000 mL | INTRAVENOUS | Status: DC | PRN
  Administered 2022-10-04: 10 mL

## 2022-10-04 MED ORDER — SODIUM CHLORIDE 0.9 % IV SOLN
10.0000 mg | Freq: Once | INTRAVENOUS | Status: AC
  Administered 2022-10-04: 10 mg via INTRAVENOUS
  Filled 2022-10-04: qty 10

## 2022-10-04 MED ORDER — HEPARIN SOD (PORK) LOCK FLUSH 100 UNIT/ML IV SOLN
500.0000 [IU] | Freq: Once | INTRAVENOUS | Status: AC | PRN
  Administered 2022-10-04: 500 [IU]

## 2022-10-04 MED ORDER — FAMOTIDINE IN NACL 20-0.9 MG/50ML-% IV SOLN
20.0000 mg | Freq: Once | INTRAVENOUS | Status: AC
  Administered 2022-10-04: 20 mg via INTRAVENOUS
  Filled 2022-10-04: qty 50

## 2022-10-04 MED ORDER — SODIUM CHLORIDE 0.9 % IV SOLN: Freq: Once | INTRAVENOUS | Status: AC

## 2022-10-04 MED ORDER — SODIUM CHLORIDE 0.9 % IV SOLN
80.0000 mg/m2 | Freq: Once | INTRAVENOUS | Status: AC
  Administered 2022-10-04: 138 mg via INTRAVENOUS
  Filled 2022-10-04: qty 23

## 2022-10-04 MED ORDER — DIPHENHYDRAMINE HCL 50 MG/ML IJ SOLN
50.0000 mg | Freq: Once | INTRAMUSCULAR | Status: AC
  Administered 2022-10-04: 50 mg via INTRAVENOUS
  Filled 2022-10-04: qty 1

## 2022-10-04 NOTE — Patient Instructions (Signed)
[facility] [facility]   Discharge Instructions: Thank you for choosing [facility] to provide your oncology and hematology care.   If you have a lab appointment with the Cancer Center, please go directly to the Cancer Center and check in at the registration area.   Wear comfortable clothing and clothing appropriate for easy access to any Portacath or PICC line.   We strive to give you quality time with your provider. You may need to reschedule your appointment if you arrive late (15 or more minutes).  Arriving late affects you and other patients whose appointments are after yours.  Also, if you miss three or more appointments without notifying the office, you may be dismissed from the clinic at the provider's discretion.      For prescription refill requests, have your pharmacy contact our office and allow 72 hours for refills to be completed.    Today you received the following chemotherapy and/or immunotherapy agents Ramucirumab (CYRAMZA) & Paclitaxel (TAXOL).      To help prevent nausea and vomiting after your treatment, we encourage you to take your nausea medication as directed.  BELOW ARE SYMPTOMS THAT SHOULD BE REPORTED IMMEDIATELY: *FEVER GREATER THAN 100.4 F (38 C) OR HIGHER *CHILLS OR SWEATING *NAUSEA AND VOMITING THAT IS NOT CONTROLLED WITH YOUR NAUSEA MEDICATION *UNUSUAL SHORTNESS OF BREATH *UNUSUAL BRUISING OR BLEEDING *URINARY PROBLEMS (pain or burning when urinating, or frequent urination) *BOWEL PROBLEMS (unusual diarrhea, constipation, pain near the anus) TENDERNESS IN MOUTH AND THROAT WITH OR WITHOUT PRESENCE OF ULCERS (sore throat, sores in mouth, or a toothache) UNUSUAL RASH, SWELLING OR PAIN  UNUSUAL VAGINAL DISCHARGE OR ITCHING   Items with * indicate a potential emergency and should be followed up as soon as possible or go to the Emergency Department if any problems should occur.  Please show the CHEMOTHERAPY ALERT CARD or  IMMUNOTHERAPY ALERT CARD at check-in to the Emergency Department and triage nurse.  Should you have questions after your visit or need to cancel or reschedule your appointment, please contact [place] [facility]  Dept: [phone]  and follow the prompts.  Office hours are 8:00 a.m. to 4:30 p.m. Monday - Friday. Please note that voicemails left after 4:00 p.m. may not be returned until the following business day.  We are closed weekends and major holidays. You have access to a nurse at all times for urgent questions. Please call the main number to the clinic Dept: [phone] and follow the prompts.   For any non-urgent questions, you may also contact your provider using MyChart. We now offer e-Visits for anyone 57 and older to request care online for non-urgent symptoms. For details visit [email or web address].   Also download the MyChart app! Go to the app store, search "MyChart", open the app, select [facility], and log in with your MyChart username and password.  Ramucirumab Injection What is this medication? RAMUCIRUMAB (ra mue SIR ue mab) treats some types of cancer. It works by blocking a protein that causes cancer cells to grow and multiply. This helps to slow or stop the spread of cancer cells. It is a monoclonal antibody. This medicine may be used for other purposes; ask your health care provider or pharmacist if you have questions. COMMON BRAND NAME(S): Cyramza What should I tell my care team before I take this medication? They need to know if you have any of these conditions: Blood clots Having or recent surgery Heart attack High blood  pressure History of a tear in your stomach or intestines Liver disease Protein in your urine Stomach bleeding Stroke Thyroid disease An unusual or allergic reaction to ramucirumab, other medications, foods, dyes, or preservatives Pregnant or trying to get pregnant Breast-feeding How should I use this  medication? This medication is injected into a vein. It is given by your care team in a hospital or clinic setting. Talk to your care team about the use of this medication in children. Special care may be needed. Overdosage: If you think you have taken too much of this medicine contact a poison control center or emergency room at once. NOTE: This medicine is only for you. Do not share this medicine with others. What if I miss a dose? Keep appointments for follow-up doses. It is important not to miss your dose. Call your care team if you are unable to keep an appointment. What may interact with this medication? Interactions have not been studied. This list may not describe all possible interactions. Give your health care provider a list of all the medicines, herbs, non-prescription drugs, or dietary supplements you use. Also tell them if you smoke, drink alcohol, or use illegal drugs. Some items may interact with your medicine. What should I watch for while using this medication? Your condition will be monitored carefully while you are receiving this medication. You may need blood work while taking this medication. This medication may make you feel generally unwell. This is not uncommon as chemotherapy can affect health cells as well as cancer cells. Report any side effects. Continue your course of treatment even though you feel ill unless your care team tells you to stop. This medication may increase your risk to bruise or bleed. Call your care team if you notice any unusual bleeding. Before having surgery, talk to your care team to make sure it is ok. This medication can increase the risk of poor healing of your surgical site or wound. You will need to stop this medication for 28 days before surgery. After surgery, wait at least 2 weeks before restarting this medication. Make sure the surgical site or wound is healed enough before restarting this medication. Talk to your care team if questions. Talk  to your care team if you may be pregnant. Serious birth defects can occur if you take this medication during pregnancy and for 3 months after the last dose. You will need a negative pregnancy test before starting this medication. Contraception is recommended while taking this medication and for 3 months after the last dose. Your care team can help you find the option that works for you. Do not breastfeed while taking this medication and for 2 months after the last dose. This medication may cause infertility. Talk to your care team if you are concerned about your fertility. What side effects may I notice from receiving this medication? Side effects that you should report to your care team as soon as possible: Allergic reactions--skin rash, itching, hives, swelling of the face, lips, tongue, or throat Bleeding--bloody or black, tar-like stools, vomiting blood or brown material that looks like coffee grounds, red or dark brown urine, small red or purple spots on skin, unusual bruising or bleeding Dizziness, loss of balance or coordination, confusion or trouble speaking Heart attack--pain or tightness in the chest, shoulders, arms, or jaw, nausea, shortness of breath, cold or clammy skin, feeling faint or lightheaded Increase in blood pressure Infection--fever, chills, cough, sore throat, wounds that don't heal, pain or trouble when passing urine,  general feeling of discomfort or being unwell Infusion reactions--chest pain, shortness of breath or trouble breathing, feeling faint or lightheaded Kidney injury--decrease in the amount of urine, swelling of the ankles, hands, or feet Liver injury--right upper belly pain, loss of appetite, nausea, light-colored stool, dark yellow or brown urine, yellowing skin or eyes, unusual weakness or fatigue Low thyroid levels (hypothyroidism)--unusual weakness or fatigue, increased sensitivity to cold, constipation, hair loss, dry skin, weight gain, feelings of  depression Stomach pain that is severe, does not go away, or gets worse Stroke--sudden numbness or weakness of the face, arm, or leg, trouble speaking, confusion, trouble walking, loss of balance or coordination, dizziness, severe headache, change in vision Sudden and severe headache, confusion, change in vision, seizures, which may be signs of posterior reversible encephalopathy syndrome (PRES) Side effects that usually do not require medical attention (report to your care team if they continue or are bothersome): Diarrhea Fatigue Stomach pain Swelling of the ankles, hands, or feet This list may not describe all possible side effects. Call your doctor for medical advice about side effects. You may report side effects to FDA at 1-800-FDA-1088. Where should I keep my medication? This medication is given in a hospital or clinic. It will not be stored at home. NOTE: This sheet is a summary. It may not cover all possible information. If you have questions about this medicine, talk to your doctor, pharmacist, or health care provider.  2023 Elsevier/Gold Standard (2021-10-14 00:00:00)  Paclitaxel Injection What is this medication? PACLITAXEL (PAK li TAX el) treats some types of cancer. It works by slowing down the growth of cancer cells. This medicine may be used for other purposes; ask your health care provider or pharmacist if you have questions. COMMON BRAND NAME(S): Onxol, Taxol What should I tell my care team before I take this medication? They need to know if you have any of these conditions: Heart disease Liver disease Low white blood cell levels An unusual or allergic reaction to paclitaxel, other medications, foods, dyes, or preservatives If you or your partner are pregnant or trying to get pregnant Breast-feeding How should I use this medication? This medication is injected into a vein. It is given by your care team in a hospital or clinic setting. Talk to your care team about the  use of this medication in children. While it may be given to children for selected conditions, precautions do apply. Overdosage: If you think you have taken too much of this medicine contact a poison control center or emergency room at once. NOTE: This medicine is only for you. Do not share this medicine with others. What if I miss a dose? Keep appointments for follow-up doses. It is important not to miss your dose. Call your care team if you are unable to keep an appointment. What may interact with this medication? Do not take this medication with any of the following: Live virus vaccines Other medications may affect the way this medication works. Talk with your care team about all of the medications you take. They may suggest changes to your treatment plan to lower the risk of side effects and to make sure your medications work as intended. This list may not describe all possible interactions. Give your health care provider a list of all the medicines, herbs, non-prescription drugs, or dietary supplements you use. Also tell them if you smoke, drink alcohol, or use illegal drugs. Some items may interact with your medicine. What should I watch for while using this medication?  Your condition will be monitored carefully while you are receiving this medication. You may need blood work while taking this medication. This medication may make you feel generally unwell. This is not uncommon as chemotherapy can affect healthy cells as well as cancer cells. Report any side effects. Continue your course of treatment even though you feel ill unless your care team tells you to stop. This medication can cause serious allergic reactions. To reduce the risk, your care team may give you other medications to take before receiving this one. Be sure to follow the directions from your care team. This medication may increase your risk of getting an infection. Call your care team for advice if you get a fever, chills, sore  throat, or other symptoms of a cold or flu. Do not treat yourself. Try to avoid being around people who are sick. This medication may increase your risk to bruise or bleed. Call your care team if you notice any unusual bleeding. Be careful brushing or flossing your teeth or using a toothpick because you may get an infection or bleed more easily. If you have any dental work done, tell your dentist you are receiving this medication. Talk to your care team if you may be pregnant. Serious birth defects can occur if you take this medication during pregnancy. Talk to your care team before breastfeeding. Changes to your treatment plan may be needed. What side effects may I notice from receiving this medication? Side effects that you should report to your care team as soon as possible: Allergic reactions--skin rash, itching, hives, swelling of the face, lips, tongue, or throat Heart rhythm changes--fast or irregular heartbeat, dizziness, feeling faint or lightheaded, chest pain, trouble breathing Increase in blood pressure Infection--fever, chills, cough, sore throat, wounds that don't heal, pain or trouble when passing urine, general feeling of discomfort or being unwell Low blood pressure--dizziness, feeling faint or lightheaded, blurry vision Low red blood cell level--unusual weakness or fatigue, dizziness, headache, trouble breathing Painful swelling, warmth, or redness of the skin, blisters or sores at the infusion site Pain, tingling, or numbness in the hands or feet Slow heartbeat--dizziness, feeling faint or lightheaded, confusion, trouble breathing, unusual weakness or fatigue Unusual bruising or bleeding Side effects that usually do not require medical attention (report to your care team if they continue or are bothersome): Diarrhea Hair loss Joint pain Loss of appetite Muscle pain Nausea Vomiting This list may not describe all possible side effects. Call your doctor for medical advice  about side effects. You may report side effects to FDA at 1-800-FDA-1088. Where should I keep my medication? This medication is given in a hospital or clinic. It will not be stored at home. NOTE: This sheet is a summary. It may not cover all possible information. If you have questions about this medicine, talk to your doctor, pharmacist, or health care provider.  2023 Elsevier/Gold Standard (2021-10-27 00:00:00)

## 2022-10-04 NOTE — Progress Notes (Signed)
Patient seen by Lonna Cobb NP today  Vitals are within treatment parameters.  Labs reviewed by Lonna Cobb NP and are within treatment parameters.The patient's potassium level is 3.4, the white blood cell count is 3.3, and there is elevation in transaminases, while the total bilirubin level is within normal range. This information was discussed and reviewed with the Cancer Center pharmacist and Misty Stanley. Per physician team, patient is ready for treatment and there are NO modifications to the treatment plan.

## 2022-10-04 NOTE — Progress Notes (Signed)
[facility] OFFICE PROGRESS NOTE   Diagnosis: Metastatic adenocarcinoma  INTERVAL HISTORY:   [name] [name] returns as scheduled.  He completed cycle 1 Taxol/cyramza 09/20/2022.  He had a single episode of nausea.  No mouth sores.  No diarrhea.  Arthralgias for a few days.  He denies bleeding.  Objective:  Vital signs in last 24 hours:  Blood pressure 107/78, pulse 98, temperature 98.1 F (36.7 C), temperature source Oral, resp. rate 20, height 5\' 7"  (1.702 m), weight 140 lb (63.5 kg), SpO2 98 %.    HEENT: No thrush or ulcers. Resp: Distant breath sounds.  No respiratory distress. Cardio: Regular rate and rhythm. GI: No hepatosplenomegaly. Vascular: Trace edema right lower leg. Port-A-Cath without erythema.  Lab Results:  Lab Results  Component Value Date   WBC 3.3 (L) 10/04/2022   HGB 11.8 (L) 10/04/2022   HCT 37.5 (L) 10/04/2022   MCV 91.2 10/04/2022   PLT 144 (L) 10/04/2022   NEUTROABS 1.7 10/04/2022    Imaging:  No results found.  Medications: I have reviewed the patient's current medications.  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 -02/11/2022 CTA chest-7.8 x 8.5 cm subtle hypoattenuating mass lesion in segment IV of the liver. -02/11/2022 CT abdomen/pelvis-8.7 x 7.8 or ill-defined irregular mass in the medial segment left liver, 4.5 x 3.4 cm necrotic mass lesion just cranial to the esophagogastric junction with metastatic lymphadenopathy in the abdomen and pelvis, irregular nodular peritoneal thickening in the lower abdomen and pelvis -02/11/2022 MRI abdomen-rim-enhancing likely necrotic mass or lymph node conglomerate centered around the gastroesophageal junction measuring 5.0 x 3.9 cm, large rim-enhancing likely necrotic liver mass centered in the anterior left lobe of the liver measuring 7.8 x  7.2 cm, enlarged gastrohepatic ligament and retroperitoneal lymph nodes -Labs from 02/11/2022-AFP 2.4, CA 19.9 88, CEA 287 -Ultrasound-guided liver biopsy performed 02/15/2022-adenocarcinoma with extensive necrosis, CK7 positive, negative for CK20, CDX2, TTF-1, and PAX8, consistent with a pancreatic, lung, or upper gastrointestinal primary -PET 03/08/2022-hypermetabolic distal esophagus mass, hypermetabolic centrally necrotic hepatic mass, hypermetabolic periportal nodes, exophytic GE junction mass without hypermetabolism-likely benign lesion, no hypermetabolism in the pancreas head, bilateral hypermetabolic adrenal gland lesions, small hypermetabolic left parotid gland lesion.  No loss of mismatch repair protein expression, HER2 3+ -CTs 03/11/2022-no change from PET 03/08/2022, known distal esophagus mass-not visualized on unenhanced CT, large hepatic metastasis and upper abdominal nodal metastases, chronic pancreatitis with 4.3 cm pseudocyst in the pancreas head -Cycle 1 FOLFOX, trastuzumab, Pembrolizumab 04/06/2022 -Cycle 2 oxaliplatin, 5-FU bolus/pump and trastuzumab held due to hospitalization following cycle 1 with possible coronary vasospasm -Pembrolizumab 04/25/2022 -CT abdomen/pelvis 05/02/2022-increased peripancreatic edema, increased size of pancreas head pseudocyst, increased necrotic nodal masses at the GE junction -Cycle 3 oxaliplatin, trastuzumab, pembrolizumab 05/15/2022; 5-FU held secondary to cardiac toxicity following cycle 1 -Cycle 4 oxaliplatin, trastuzumab 05/29/2022, 5-FU held secondary to cardiac toxicity -Cycle 5 oxaliplatin, trastuzumab, Pembrolizumab (6-week dosing for Pembrolizumab) 06/12/2022, 5-FU held secondary to cardiac toxicity -Cycle 6 oxaliplatin, trastuzumab 06/28/2022, 5-FU held -Cycle 7 oxaliplatin, trastuzumab 07/17/2022, 5-FU held -CTs 07/19/2022-decrease in the dominant anterior liver mass, 2 new small liver lesions compared to 05/02/2022, new right lower lobe nodule,  enlargement of epicardial lymph nodes, slight decrease in size of celiac axis, gastropathic ligament, and portacaval lymphadenopathy, unchanged esophageal thickening -Cycle 8 oxaliplatin and pembrolizumab 08/14/2022, 5-FU and trastuzumab held secondary to potential cardiac toxicity CTs 09/12/2022-preliminary review consistent with disease  progression Cycle 1 Taxol/cyramza 09/20/2022 Cycle 2 Taxol/Cyramza 10/04/2022 2.  Myocardial infarction/cardiac arrest 01/31/2022 -Echocardiogram 02/01/2022-LVEF 60-65%, right ventricle severely dilated with hypokinesis in the basal and midportion, hypokinesis in the apical portion, right ventricular systolic function moderately reduced -Echocardiogram on 05/01/2022, LVEF 50-55%, decreased right ventricular systolic function, moderately enlarged right ventricle -Echocardiogram 08/05/2022-LVEF 45-50%, mildly decreased left ventricular function, moderate reduced right ventricular function 3.  Microcytic anemia -Labs from 02/11/2022-ferritin 151, iron 13, percent saturation 5% 4.  DVT/pulmonary embolism-bilateral lower extremity DVTs 01/10/2022, pulmonary emboli on chest CT 01/31/2022-apixaban 5.  PAD status post AKA in 2021 6.  RA 7.  Bilateral pleural effusions -Ultrasound-guided thoracentesis performed on 02/11/2022.  Cytology negative for malignancy. 8.  COPD 9.  Admission 03/11/2022 with E. coli bacteremia Ultrasound-guided biopsy/aspiration of dominant necrotic appearing liver lesion 03/17/2022-scant fluid aspirated-negative culture, no organisms or white cells.  Pathology with necrotic tissue. 10.  Admission 04/29/2022 after a fall, multiple skull fractures 11.  Fever, hypotension-Klebsiella bacteremia on blood culture 04/30/2022 12.  Admission 04/09/2022 with chest pain, NSTEMI 13.  Admission 08/04/2022 with ventricular tachycardia, status post cardioversion, noted to have hypokalemia and hypomagnesemia, started on amiodarone  Disposition: [name] [name] appears stable.  Aside  from mild arthralgias he tolerated cycle 1 Taxol/cyramza well.  Plan to proceed with cycle 2 today as scheduled.  CBC and chemistry panel reviewed.  Labs adequate to proceed as above.  He has mild hypokalemia, "ran out" of oral potassium a few days ago.  He will resume.  Transaminases elevated, total bili normal.  Reviewed with [facility] Center pharmacist.  No adjustments required for either Taxol or cyramza.  He will return for lab, follow-up, cycle 3 Taxol/cyramza in 2 weeks.  He will contact the office in the interim with any problems.    Lonna Cobb ANP/GNP-BC   10/04/2022  10:01 AM

## 2022-10-06 NOTE — Telephone Encounter (Signed)
Rescheduled appointment with Shanda Bumps, PA on 10/31/22 @ 1:30 pm

## 2022-10-09 DIAGNOSIS — Z89612 Acquired absence of left leg above knee: Secondary | ICD-10-CM | POA: Diagnosis not present

## 2022-10-09 DIAGNOSIS — Z9181 History of falling: Secondary | ICD-10-CM | POA: Diagnosis not present

## 2022-10-09 DIAGNOSIS — I2510 Atherosclerotic heart disease of native coronary artery without angina pectoris: Secondary | ICD-10-CM | POA: Diagnosis not present

## 2022-10-09 DIAGNOSIS — M5137 Other intervertebral disc degeneration, lumbosacral region: Secondary | ICD-10-CM | POA: Diagnosis not present

## 2022-10-09 DIAGNOSIS — I10 Essential (primary) hypertension: Secondary | ICD-10-CM | POA: Diagnosis not present

## 2022-10-09 DIAGNOSIS — Z8673 Personal history of transient ischemic attack (TIA), and cerebral infarction without residual deficits: Secondary | ICD-10-CM | POA: Diagnosis not present

## 2022-10-09 DIAGNOSIS — J9601 Acute respiratory failure with hypoxia: Secondary | ICD-10-CM | POA: Diagnosis not present

## 2022-10-09 DIAGNOSIS — J449 Chronic obstructive pulmonary disease, unspecified: Secondary | ICD-10-CM | POA: Diagnosis not present

## 2022-10-09 DIAGNOSIS — K219 Gastro-esophageal reflux disease without esophagitis: Secondary | ICD-10-CM | POA: Diagnosis not present

## 2022-10-09 DIAGNOSIS — E43 Unspecified severe protein-calorie malnutrition: Secondary | ICD-10-CM | POA: Diagnosis not present

## 2022-10-09 DIAGNOSIS — Z86718 Personal history of other venous thrombosis and embolism: Secondary | ICD-10-CM | POA: Diagnosis not present

## 2022-10-09 DIAGNOSIS — I472 Ventricular tachycardia, unspecified: Secondary | ICD-10-CM | POA: Diagnosis not present

## 2022-10-09 DIAGNOSIS — Z8616 Personal history of COVID-19: Secondary | ICD-10-CM | POA: Diagnosis not present

## 2022-10-09 DIAGNOSIS — M069 Rheumatoid arthritis, unspecified: Secondary | ICD-10-CM | POA: Diagnosis not present

## 2022-10-09 DIAGNOSIS — I252 Old myocardial infarction: Secondary | ICD-10-CM | POA: Diagnosis not present

## 2022-10-09 DIAGNOSIS — C771 Secondary and unspecified malignant neoplasm of intrathoracic lymph nodes: Secondary | ICD-10-CM | POA: Diagnosis not present

## 2022-10-09 DIAGNOSIS — Z7902 Long term (current) use of antithrombotics/antiplatelets: Secondary | ICD-10-CM | POA: Diagnosis not present

## 2022-10-09 DIAGNOSIS — Z79891 Long term (current) use of opiate analgesic: Secondary | ICD-10-CM | POA: Diagnosis not present

## 2022-10-09 DIAGNOSIS — I739 Peripheral vascular disease, unspecified: Secondary | ICD-10-CM | POA: Diagnosis not present

## 2022-10-09 DIAGNOSIS — D630 Anemia in neoplastic disease: Secondary | ICD-10-CM | POA: Diagnosis not present

## 2022-10-09 DIAGNOSIS — J439 Emphysema, unspecified: Secondary | ICD-10-CM | POA: Diagnosis not present

## 2022-10-12 ENCOUNTER — Other Ambulatory Visit: Payer: Self-pay | Admitting: Student

## 2022-10-12 DIAGNOSIS — I739 Peripheral vascular disease, unspecified: Secondary | ICD-10-CM | POA: Diagnosis not present

## 2022-10-12 DIAGNOSIS — M069 Rheumatoid arthritis, unspecified: Secondary | ICD-10-CM | POA: Diagnosis not present

## 2022-10-12 DIAGNOSIS — I471 Supraventricular tachycardia, unspecified: Secondary | ICD-10-CM | POA: Diagnosis not present

## 2022-10-12 DIAGNOSIS — J439 Emphysema, unspecified: Secondary | ICD-10-CM | POA: Diagnosis not present

## 2022-10-12 DIAGNOSIS — J449 Chronic obstructive pulmonary disease, unspecified: Secondary | ICD-10-CM | POA: Diagnosis not present

## 2022-10-14 DIAGNOSIS — Z791 Long term (current) use of non-steroidal anti-inflammatories (NSAID): Secondary | ICD-10-CM | POA: Diagnosis not present

## 2022-10-14 DIAGNOSIS — I2510 Atherosclerotic heart disease of native coronary artery without angina pectoris: Secondary | ICD-10-CM | POA: Diagnosis not present

## 2022-10-14 DIAGNOSIS — Z86718 Personal history of other venous thrombosis and embolism: Secondary | ICD-10-CM | POA: Diagnosis not present

## 2022-10-14 DIAGNOSIS — Z79891 Long term (current) use of opiate analgesic: Secondary | ICD-10-CM | POA: Diagnosis not present

## 2022-10-14 DIAGNOSIS — M5137 Other intervertebral disc degeneration, lumbosacral region: Secondary | ICD-10-CM | POA: Diagnosis not present

## 2022-10-14 DIAGNOSIS — E43 Unspecified severe protein-calorie malnutrition: Secondary | ICD-10-CM | POA: Diagnosis not present

## 2022-10-14 DIAGNOSIS — J9601 Acute respiratory failure with hypoxia: Secondary | ICD-10-CM | POA: Diagnosis not present

## 2022-10-14 DIAGNOSIS — Z7902 Long term (current) use of antithrombotics/antiplatelets: Secondary | ICD-10-CM | POA: Diagnosis not present

## 2022-10-14 DIAGNOSIS — J439 Emphysema, unspecified: Secondary | ICD-10-CM | POA: Diagnosis not present

## 2022-10-14 DIAGNOSIS — D630 Anemia in neoplastic disease: Secondary | ICD-10-CM | POA: Diagnosis not present

## 2022-10-14 DIAGNOSIS — C771 Secondary and unspecified malignant neoplasm of intrathoracic lymph nodes: Secondary | ICD-10-CM | POA: Diagnosis not present

## 2022-10-14 DIAGNOSIS — Z993 Dependence on wheelchair: Secondary | ICD-10-CM | POA: Diagnosis not present

## 2022-10-14 DIAGNOSIS — I472 Ventricular tachycardia, unspecified: Secondary | ICD-10-CM | POA: Diagnosis not present

## 2022-10-14 DIAGNOSIS — I739 Peripheral vascular disease, unspecified: Secondary | ICD-10-CM | POA: Diagnosis not present

## 2022-10-14 DIAGNOSIS — Z89612 Acquired absence of left leg above knee: Secondary | ICD-10-CM | POA: Diagnosis not present

## 2022-10-14 DIAGNOSIS — I252 Old myocardial infarction: Secondary | ICD-10-CM | POA: Diagnosis not present

## 2022-10-14 DIAGNOSIS — K219 Gastro-esophageal reflux disease without esophagitis: Secondary | ICD-10-CM | POA: Diagnosis not present

## 2022-10-14 DIAGNOSIS — M069 Rheumatoid arthritis, unspecified: Secondary | ICD-10-CM | POA: Diagnosis not present

## 2022-10-14 DIAGNOSIS — I10 Essential (primary) hypertension: Secondary | ICD-10-CM | POA: Diagnosis not present

## 2022-10-14 DIAGNOSIS — Z8616 Personal history of COVID-19: Secondary | ICD-10-CM | POA: Diagnosis not present

## 2022-10-14 DIAGNOSIS — Z9181 History of falling: Secondary | ICD-10-CM | POA: Diagnosis not present

## 2022-10-14 DIAGNOSIS — Z8673 Personal history of transient ischemic attack (TIA), and cerebral infarction without residual deficits: Secondary | ICD-10-CM | POA: Diagnosis not present

## 2022-10-14 DIAGNOSIS — J449 Chronic obstructive pulmonary disease, unspecified: Secondary | ICD-10-CM | POA: Diagnosis not present

## 2022-10-15 ENCOUNTER — Other Ambulatory Visit: Payer: Self-pay | Admitting: Oncology

## 2022-10-16 ENCOUNTER — Ambulatory Visit: Payer: 59

## 2022-10-16 ENCOUNTER — Other Ambulatory Visit: Payer: Self-pay

## 2022-10-17 DIAGNOSIS — Z79899 Other long term (current) drug therapy: Secondary | ICD-10-CM | POA: Diagnosis not present

## 2022-10-17 DIAGNOSIS — M47817 Spondylosis without myelopathy or radiculopathy, lumbosacral region: Secondary | ICD-10-CM | POA: Diagnosis not present

## 2022-10-17 DIAGNOSIS — M069 Rheumatoid arthritis, unspecified: Secondary | ICD-10-CM | POA: Diagnosis not present

## 2022-10-17 MED ORDER — METHOTREXATE SODIUM 2.5 MG PO TABS: 7.5000 mg | ORAL_TABLET | ORAL | 0 refills | Status: DC

## 2022-10-19 ENCOUNTER — Encounter: Payer: Self-pay | Admitting: *Deleted

## 2022-10-19 ENCOUNTER — Inpatient Hospital Stay: Payer: 59

## 2022-10-19 ENCOUNTER — Inpatient Hospital Stay (HOSPITAL_BASED_OUTPATIENT_CLINIC_OR_DEPARTMENT_OTHER): Payer: 59 | Admitting: Oncology

## 2022-10-19 VITALS — BP 98/64 | HR 82 | Temp 98.2°F | Resp 18 | Ht 67.0 in | Wt 138.0 lb

## 2022-10-19 VITALS — BP 102/84 | HR 78 | Temp 98.2°F | Resp 18

## 2022-10-19 DIAGNOSIS — Z5111 Encounter for antineoplastic chemotherapy: Secondary | ICD-10-CM | POA: Diagnosis not present

## 2022-10-19 DIAGNOSIS — C159 Malignant neoplasm of esophagus, unspecified: Secondary | ICD-10-CM

## 2022-10-19 DIAGNOSIS — J449 Chronic obstructive pulmonary disease, unspecified: Secondary | ICD-10-CM | POA: Diagnosis not present

## 2022-10-19 DIAGNOSIS — C801 Malignant (primary) neoplasm, unspecified: Secondary | ICD-10-CM | POA: Diagnosis not present

## 2022-10-19 DIAGNOSIS — Z8674 Personal history of sudden cardiac arrest: Secondary | ICD-10-CM | POA: Diagnosis not present

## 2022-10-19 DIAGNOSIS — I252 Old myocardial infarction: Secondary | ICD-10-CM | POA: Diagnosis not present

## 2022-10-19 DIAGNOSIS — I517 Cardiomegaly: Secondary | ICD-10-CM | POA: Diagnosis not present

## 2022-10-19 DIAGNOSIS — Z86711 Personal history of pulmonary embolism: Secondary | ICD-10-CM | POA: Diagnosis not present

## 2022-10-19 DIAGNOSIS — Z79899 Other long term (current) drug therapy: Secondary | ICD-10-CM | POA: Diagnosis not present

## 2022-10-19 DIAGNOSIS — C787 Secondary malignant neoplasm of liver and intrahepatic bile duct: Secondary | ICD-10-CM | POA: Diagnosis not present

## 2022-10-19 DIAGNOSIS — D509 Iron deficiency anemia, unspecified: Secondary | ICD-10-CM | POA: Diagnosis not present

## 2022-10-19 LAB — CBC WITH DIFFERENTIAL (CANCER CENTER ONLY)
Abs Immature Granulocytes: 0.01 10*3/uL (ref 0.00–0.07)
Basophils Absolute: 0 10*3/uL (ref 0.0–0.1)
Basophils Relative: 1 %
Eosinophils Absolute: 0.3 10*3/uL (ref 0.0–0.5)
Eosinophils Relative: 8 %
HCT: 34.1 % — ABNORMAL LOW (ref 39.0–52.0)
Hemoglobin: 10.6 g/dL — ABNORMAL LOW (ref 13.0–17.0)
Immature Granulocytes: 0 %
Lymphocytes Relative: 27 %
Lymphs Abs: 1 10*3/uL (ref 0.7–4.0)
MCH: 28.4 pg (ref 26.0–34.0)
MCHC: 31.1 g/dL (ref 30.0–36.0)
MCV: 91.4 fL (ref 80.0–100.0)
Monocytes Absolute: 0.4 10*3/uL (ref 0.1–1.0)
Monocytes Relative: 10 %
Neutro Abs: 1.9 10*3/uL (ref 1.7–7.7)
Neutrophils Relative %: 54 %
Platelet Count: 87 10*3/uL — ABNORMAL LOW (ref 150–400)
RBC: 3.73 MIL/uL — ABNORMAL LOW (ref 4.22–5.81)
RDW: 17.9 % — ABNORMAL HIGH (ref 11.5–15.5)
WBC Count: 3.5 10*3/uL — ABNORMAL LOW (ref 4.0–10.5)
nRBC: 0 % (ref 0.0–0.2)

## 2022-10-19 LAB — CMP (CANCER CENTER ONLY)
ALT: 54 U/L — ABNORMAL HIGH (ref 0–44)
AST: 41 U/L (ref 15–41)
Albumin: 3.2 g/dL — ABNORMAL LOW (ref 3.5–5.0)
Alkaline Phosphatase: 221 U/L — ABNORMAL HIGH (ref 38–126)
Anion gap: 9 (ref 5–15)
BUN: 16 mg/dL (ref 8–23)
CO2: 23 mmol/L (ref 22–32)
Calcium: 8.4 mg/dL — ABNORMAL LOW (ref 8.9–10.3)
Chloride: 107 mmol/L (ref 98–111)
Creatinine: 0.76 mg/dL (ref 0.61–1.24)
GFR, Estimated: >60 mL/min (ref >60)
Glucose, Bld: 101 mg/dL — ABNORMAL HIGH (ref 70–99)
Potassium: 3.3 mmol/L — ABNORMAL LOW (ref 3.5–5.1)
Sodium: 139 mmol/L (ref 135–145)
Total Bilirubin: 0.7 mg/dL (ref 0.3–1.2)
Total Protein: 5.9 g/dL — ABNORMAL LOW (ref 6.5–8.1)

## 2022-10-19 LAB — CEA (ACCESS): CEA (CHCC): 433.32 ng/mL — ABNORMAL HIGH (ref 0.00–5.00)

## 2022-10-19 MED ORDER — HEPARIN SOD (PORK) LOCK FLUSH 100 UNIT/ML IV SOLN
500.0000 [IU] | Freq: Once | INTRAVENOUS | Status: AC | PRN
  Administered 2022-10-19: 500 [IU]

## 2022-10-19 MED ORDER — ONDANSETRON HCL 8 MG PO TABS: ORAL_TABLET | ORAL | 1 refills | Status: DC

## 2022-10-19 MED ORDER — PROCHLORPERAZINE MALEATE 10 MG PO TABS: 10.0000 mg | ORAL_TABLET | Freq: Four times a day (QID) | ORAL | 1 refills | Status: DC | PRN

## 2022-10-19 MED ORDER — DIPHENHYDRAMINE HCL 50 MG/ML IJ SOLN
50.0000 mg | Freq: Once | INTRAMUSCULAR | Status: AC
  Administered 2022-10-19: 50 mg via INTRAVENOUS
  Filled 2022-10-19: qty 1

## 2022-10-19 MED ORDER — POTASSIUM CHLORIDE CRYS ER 10 MEQ PO TBCR: EXTENDED_RELEASE_TABLET | ORAL | 0 refills | Status: DC

## 2022-10-19 MED ORDER — PALONOSETRON HCL INJECTION 0.25 MG/5ML
0.2500 mg | Freq: Once | INTRAVENOUS | Status: AC
  Administered 2022-10-19: 0.25 mg via INTRAVENOUS
  Filled 2022-10-19: qty 5

## 2022-10-19 MED ORDER — SODIUM CHLORIDE 0.9% FLUSH
10.0000 mL | INTRAVENOUS | Status: DC | PRN
  Administered 2022-10-19: 10 mL

## 2022-10-19 MED ORDER — SODIUM CHLORIDE 0.9 % IV SOLN: Freq: Once | INTRAVENOUS | Status: AC

## 2022-10-19 MED ORDER — FAMOTIDINE IN NACL 20-0.9 MG/50ML-% IV SOLN
20.0000 mg | Freq: Once | INTRAVENOUS | Status: AC
  Administered 2022-10-19: 20 mg via INTRAVENOUS
  Filled 2022-10-19: qty 50

## 2022-10-19 MED ORDER — SODIUM CHLORIDE 0.9 % IV SOLN
60.0000 mg/m2 | Freq: Once | INTRAVENOUS | Status: AC
  Administered 2022-10-19: 102 mg via INTRAVENOUS
  Filled 2022-10-19: qty 17

## 2022-10-19 MED ORDER — SODIUM CHLORIDE 0.9 % IV SOLN
8.0000 mg/kg | Freq: Once | INTRAVENOUS | Status: AC
  Administered 2022-10-19: 500 mg via INTRAVENOUS
  Filled 2022-10-19: qty 50

## 2022-10-19 MED ORDER — SODIUM CHLORIDE 0.9 % IV SOLN
10.0000 mg | Freq: Once | INTRAVENOUS | Status: AC
  Administered 2022-10-19: 10 mg via INTRAVENOUS
  Filled 2022-10-19: qty 10

## 2022-10-19 NOTE — Patient Instructions (Addendum)
[place] [facility]   Discharge Instructions: Thank you for choosing [facility] to provide your oncology and hematology care.   If you have a lab appointment with the Cancer Center, please go directly to the Cancer Center and check in at the registration area.   Wear comfortable clothing and clothing appropriate for easy access to any Portacath or PICC line.   We strive to give you quality time with your provider. You may need to reschedule your appointment if you arrive late (15 or more minutes).  Arriving late affects you and other patients whose appointments are after yours.  Also, if you miss three or more appointments without notifying the office, you may be dismissed from the clinic at the provider's discretion.      For prescription refill requests, have your pharmacy contact our office and allow 72 hours for refills to be completed.    Today you received the following chemotherapy and/or immunotherapy agents Cyramza, Taxol.      To help prevent nausea and vomiting after your treatment, we encourage you to take your nausea medication as directed.  BELOW ARE SYMPTOMS THAT SHOULD BE REPORTED IMMEDIATELY: *FEVER GREATER THAN 100.4 F (38 C) OR HIGHER *CHILLS OR SWEATING *NAUSEA AND VOMITING THAT IS NOT CONTROLLED WITH YOUR NAUSEA MEDICATION *UNUSUAL SHORTNESS OF BREATH *UNUSUAL BRUISING OR BLEEDING *URINARY PROBLEMS (pain or burning when urinating, or frequent urination) *BOWEL PROBLEMS (unusual diarrhea, constipation, pain near the anus) TENDERNESS IN MOUTH AND THROAT WITH OR WITHOUT PRESENCE OF ULCERS (sore throat, sores in mouth, or a toothache) UNUSUAL RASH, SWELLING OR PAIN  UNUSUAL VAGINAL DISCHARGE OR ITCHING   Items with * indicate a potential emergency and should be followed up as soon as possible or go to the Emergency Department if any problems should occur.  Please show the CHEMOTHERAPY ALERT CARD or IMMUNOTHERAPY ALERT CARD at  check-in to the Emergency Department and triage nurse.  Should you have questions after your visit or need to cancel or reschedule your appointment, please contact [place] [facility]  Dept: [phone]  and follow the prompts.  Office hours are 8:00 a.m. to 4:30 p.m. Monday - Friday. Please note that voicemails left after 4:00 p.m. may not be returned until the following business day.  We are closed weekends and major holidays. You have access to a nurse at all times for urgent questions. Please call the main number to the clinic Dept: [phone] and follow the prompts.   For any non-urgent questions, you may also contact your provider using MyChart. We now offer e-Visits for anyone 68 and older to request care online for non-urgent symptoms. For details visit [email or web address].   Also download the MyChart app! Go to the app store, search "MyChart", open the app, select , and log in with your MyChart username and password.  Ramucirumab Injection What is this medication? RAMUCIRUMAB (ra mue SIR ue mab) treats some types of cancer. It works by blocking a protein that causes cancer cells to grow and multiply. This helps to slow or stop the spread of cancer cells. It is a monoclonal antibody. This medicine may be used for other purposes; ask your health care provider or pharmacist if you have questions. COMMON BRAND NAME(S): Cyramza What should I tell my care team before I take this medication? They need to know if you have any of these conditions: Blood clots Having or recent surgery Heart attack High blood pressure History of  a tear in your stomach or intestines Liver disease Protein in your urine Stomach bleeding Stroke Thyroid disease An unusual or allergic reaction to ramucirumab, other medications, foods, dyes, or preservatives Pregnant or trying to get pregnant Breast-feeding How should I use this medication? This medication is injected  into a vein. It is given by your care team in a hospital or clinic setting. Talk to your care team about the use of this medication in children. Special care may be needed. Overdosage: If you think you have taken too much of this medicine contact a poison control center or emergency room at once. NOTE: This medicine is only for you. Do not share this medicine with others. What if I miss a dose? Keep appointments for follow-up doses. It is important not to miss your dose. Call your care team if you are unable to keep an appointment. What may interact with this medication? Interactions have not been studied. This list may not describe all possible interactions. Give your health care provider a list of all the medicines, herbs, non-prescription drugs, or dietary supplements you use. Also tell them if you smoke, drink alcohol, or use illegal drugs. Some items may interact with your medicine. What should I watch for while using this medication? Your condition will be monitored carefully while you are receiving this medication. You may need blood work while taking this medication. This medication may make you feel generally unwell. This is not uncommon as chemotherapy can affect health cells as well as cancer cells. Report any side effects. Continue your course of treatment even though you feel ill unless your care team tells you to stop. This medication may increase your risk to bruise or bleed. Call your care team if you notice any unusual bleeding. Before having surgery, talk to your care team to make sure it is ok. This medication can increase the risk of poor healing of your surgical site or wound. You will need to stop this medication for 28 days before surgery. After surgery, wait at least 2 weeks before restarting this medication. Make sure the surgical site or wound is healed enough before restarting this medication. Talk to your care team if questions. Talk to your care team if you may be pregnant.  Serious birth defects can occur if you take this medication during pregnancy and for 3 months after the last dose. You will need a negative pregnancy test before starting this medication. Contraception is recommended while taking this medication and for 3 months after the last dose. Your care team can help you find the option that works for you. Do not breastfeed while taking this medication and for 2 months after the last dose. This medication may cause infertility. Talk to your care team if you are concerned about your fertility. What side effects may I notice from receiving this medication? Side effects that you should report to your care team as soon as possible: Allergic reactions--skin rash, itching, hives, swelling of the face, lips, tongue, or throat Bleeding--bloody or black, tar-like stools, vomiting blood or brown material that looks like coffee grounds, red or dark brown urine, small red or purple spots on skin, unusual bruising or bleeding Dizziness, loss of balance or coordination, confusion or trouble speaking Heart attack--pain or tightness in the chest, shoulders, arms, or jaw, nausea, shortness of breath, cold or clammy skin, feeling faint or lightheaded Increase in blood pressure Infection--fever, chills, cough, sore throat, wounds that don't heal, pain or trouble when passing urine, general feeling of  discomfort or being unwell Infusion reactions--chest pain, shortness of breath or trouble breathing, feeling faint or lightheaded Kidney injury--decrease in the amount of urine, swelling of the ankles, hands, or feet Liver injury--right upper belly pain, loss of appetite, nausea, light-colored stool, dark yellow or brown urine, yellowing skin or eyes, unusual weakness or fatigue Low thyroid levels (hypothyroidism)--unusual weakness or fatigue, increased sensitivity to cold, constipation, hair loss, dry skin, weight gain, feelings of depression Stomach pain that is severe, does not go  away, or gets worse Stroke--sudden numbness or weakness of the face, arm, or leg, trouble speaking, confusion, trouble walking, loss of balance or coordination, dizziness, severe headache, change in vision Sudden and severe headache, confusion, change in vision, seizures, which may be signs of posterior reversible encephalopathy syndrome (PRES) Side effects that usually do not require medical attention (report to your care team if they continue or are bothersome): Diarrhea Fatigue Stomach pain Swelling of the ankles, hands, or feet This list may not describe all possible side effects. Call your doctor for medical advice about side effects. You may report side effects to FDA at 1-800-FDA-1088. Where should I keep my medication? This medication is given in a hospital or clinic. It will not be stored at home. NOTE: This sheet is a summary. It may not cover all possible information. If you have questions about this medicine, talk to your doctor, pharmacist, or health care provider.  2023 Elsevier/Gold Standard (2021-10-14 00:00:00)  Paclitaxel Injection What is this medication? PACLITAXEL (PAK li TAX el) treats some types of cancer. It works by slowing down the growth of cancer cells. This medicine may be used for other purposes; ask your health care provider or pharmacist if you have questions. COMMON BRAND NAME(S): Onxol, Taxol What should I tell my care team before I take this medication? They need to know if you have any of these conditions: Heart disease Liver disease Low white blood cell levels An unusual or allergic reaction to paclitaxel, other medications, foods, dyes, or preservatives If you or your partner are pregnant or trying to get pregnant Breast-feeding How should I use this medication? This medication is injected into a vein. It is given by your care team in a hospital or clinic setting. Talk to your care team about the use of this medication in children. While it may be  given to children for selected conditions, precautions do apply. Overdosage: If you think you have taken too much of this medicine contact a poison control center or emergency room at once. NOTE: This medicine is only for you. Do not share this medicine with others. What if I miss a dose? Keep appointments for follow-up doses. It is important not to miss your dose. Call your care team if you are unable to keep an appointment. What may interact with this medication? Do not take this medication with any of the following: Live virus vaccines Other medications may affect the way this medication works. Talk with your care team about all of the medications you take. They may suggest changes to your treatment plan to lower the risk of side effects and to make sure your medications work as intended. This list may not describe all possible interactions. Give your health care provider a list of all the medicines, herbs, non-prescription drugs, or dietary supplements you use. Also tell them if you smoke, drink alcohol, or use illegal drugs. Some items may interact with your medicine. What should I watch for while using this medication? Your condition will  be monitored carefully while you are receiving this medication. You may need blood work while taking this medication. This medication may make you feel generally unwell. This is not uncommon as chemotherapy can affect healthy cells as well as cancer cells. Report any side effects. Continue your course of treatment even though you feel ill unless your care team tells you to stop. This medication can cause serious allergic reactions. To reduce the risk, your care team may give you other medications to take before receiving this one. Be sure to follow the directions from your care team. This medication may increase your risk of getting an infection. Call your care team for advice if you get a fever, chills, sore throat, or other symptoms of a cold or flu. Do not  treat yourself. Try to avoid being around people who are sick. This medication may increase your risk to bruise or bleed. Call your care team if you notice any unusual bleeding. Be careful brushing or flossing your teeth or using a toothpick because you may get an infection or bleed more easily. If you have any dental work done, tell your dentist you are receiving this medication. Talk to your care team if you may be pregnant. Serious birth defects can occur if you take this medication during pregnancy. Talk to your care team before breastfeeding. Changes to your treatment plan may be needed. What side effects may I notice from receiving this medication? Side effects that you should report to your care team as soon as possible: Allergic reactions--skin rash, itching, hives, swelling of the face, lips, tongue, or throat Heart rhythm changes--fast or irregular heartbeat, dizziness, feeling faint or lightheaded, chest pain, trouble breathing Increase in blood pressure Infection--fever, chills, cough, sore throat, wounds that don't heal, pain or trouble when passing urine, general feeling of discomfort or being unwell Low blood pressure--dizziness, feeling faint or lightheaded, blurry vision Low red blood cell level--unusual weakness or fatigue, dizziness, headache, trouble breathing Painful swelling, warmth, or redness of the skin, blisters or sores at the infusion site Pain, tingling, or numbness in the hands or feet Slow heartbeat--dizziness, feeling faint or lightheaded, confusion, trouble breathing, unusual weakness or fatigue Unusual bruising or bleeding Side effects that usually do not require medical attention (report to your care team if they continue or are bothersome): Diarrhea Hair loss Joint pain Loss of appetite Muscle pain Nausea Vomiting This list may not describe all possible side effects. Call your doctor for medical advice about side effects. You may report side effects to FDA  at 1-800-FDA-1088. Where should I keep my medication? This medication is given in a hospital or clinic. It will not be stored at home. NOTE: This sheet is a summary. It may not cover all possible information. If you have questions about this medicine, talk to your doctor, pharmacist, or health care provider.  2023 Elsevier/Gold Standard (2021-10-27 00:00:00)  Thrombocytopenia Thrombocytopenia means that you have a low number of platelets in your blood. Platelets are tiny cells in the blood. When you bleed, they clump together at the cut or injury to stop the bleeding. This is called blood clotting. If you do not have enough platelets, your blood may have trouble clotting. This may cause you to bleed and bruise very easily. What are the causes? This condition is caused by a low number of platelets in your blood. There are three main reasons for this: Your body not making enough platelets. This may be caused by: Bone marrow diseases. Disorders that are  passed from parent to child (inherited). Certain cancer medicines or treatments. Infection from germs (bacteria or viruses). Alcoholism. Platelets not being released in the blood. This can be caused by: Having a spleen that is larger than normal. A condition called Gaucher disease. Your body destroying platelets too quickly. This may be caused by: Certain autoimmune diseases. Some medicines that thin your blood. Certain blood clotting disorders. Certain bleeding disorders. Exposure to harmful (toxic) chemicals. Pregnancy. What are the signs or symptoms? Bruising easily. Bleeding from the nose or mouth. Heavy menstrual periods. Blood in the pee (urine), poop (stool), or vomit. A purple-red color to the skin (purpura). A rash that looks like pinpoint, purple-red spots (petechiae) on the lower legs. How is this treated? Treatment depends on the cause. Treatment may include: Treatment of another condition that is causing the low platelet  count. Medicines to help protect your platelets from being destroyed. A replacement (transfusion) of platelets to stop or prevent bleeding. Surgery to take out the spleen. Follow these instructions at home: Medicines Take over-the-counter and prescription medicines only as told by your doctor. Do not take any medicines that have aspirin or NSAIDs, such as ibuprofen. Activity Avoid doing things that could hurt or bruise you. Take action to prevent falls. Do not play contact sports. Ask your doctor what activities are safe for you. Take care not to burn yourself: When you use an iron. When you cook. Take care not to cut yourself: When you shave. When you use scissors, needles, knives, or other tools. General instructions  Check your skin and the inside of your mouth for bruises or blood as told by your doctor. Wear a medical alert bracelet that says that you have a bleeding disorder. Check to see if there is blood in your pee and poop. Do this as told by your doctor. Do not drink alcohol. If you do drink, limit the amount that you drink. Stay away from harmful (toxic) chemicals. Tell all of your doctors that you have this condition. Be sure to tell your dentist and eye doctor. Tell your dentist about your condition before you have your teeth cleaned. Keep all follow-up visits. Contact a doctor if: You have bruises and you do not know why. You have new symptoms. You have symptoms that get worse. You have a fever. Get help right away if: You have very bad bleeding anywhere on your body. You have blood in your vomit, pee, or poop. You have an injury to your head. You have a sudden, very bad headache. Summary Thrombocytopenia means that you have a low number of platelets in your blood. Platelets stick together to form a clot. Symptoms of this condition include getting bruises easily, bleeding from the mouth and nose, a purple-red color to the skin, and a rash. Take care not to cut  or burn yourself. This information is not intended to replace advice given to you by your health care provider. Make sure you discuss any questions you have with your health care provider. Document Revised: 11/25/2020 Document Reviewed: 11/25/2020 Elsevier Patient Education  2023 [employer].

## 2022-10-19 NOTE — Progress Notes (Signed)
Patient seen by Dr. Truett Perna today  Vitals are within treatment parameters.  Labs reviewed by Dr. Truett Perna and are not all within treatment parameters. Platelets 87 K/uL . Potassium 3.3 mmol/L.  Per physician team, patient is ready for treatment. Please note that modifications are being made to the treatment plan including Aloxi has been added to his tx plan today. Taxol will be dose reduced per MD Sherrill. Pt will continue home supply of potassium tablets as directed.

## 2022-10-19 NOTE — Progress Notes (Signed)
Provided patient case of Ensure for April.

## 2022-10-19 NOTE — Progress Notes (Signed)
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  He reports several episodes of nausea and vomiting over the few days following chemotherapy.  No dysphagia.  He feels well at present.  Good appetite.  No change in numbness at the right fingers.  No chest pain.  No other complaint. He is taking weekly methotrexate for rheumatoid arthritis. Objective:  Vital signs in last 24 hours:  Blood pressure 98/64, pulse 82, temperature 98.2 F (36.8 C), temperature source Oral, resp. rate 18, height  (1.702 m), weight 138 lb (62.6 kg), SpO2 93 %.    HEENT: No thrush or ulcers Resp: Lungs clear bilaterally Cardio: Regular rate and rhythm GI: Nontender, soft, no hepatosplenomegaly    Portacath/PICC-without erythema  Lab Results:  Lab Results  Component Value Date   WBC 3.3 (L) 10/04/2022   HGB 11.8 (L) 10/04/2022   HCT 37.5 (L) 10/04/2022   MCV 91.2 10/04/2022   PLT 144 (L) 10/04/2022   NEUTROABS 1.7 10/04/2022    CMP  Lab Results  Component Value Date   NA 139 10/19/2022   K 3.3 (L) 10/19/2022   CL 107 10/19/2022   CO2 23 10/19/2022   GLUCOSE 101 (H) 10/19/2022   BUN 16 10/19/2022   CREATININE 0.76 10/19/2022   CALCIUM 8.4 (L) 10/19/2022   PROT 5.9 (L) 10/19/2022   ALBUMIN 3.2 (L) 10/19/2022   AST 41 10/19/2022   ALT 54 (H) 10/19/2022   ALKPHOS 221 (H) 10/19/2022   BILITOT 0.7 10/19/2022   GFRNONAA >60 10/19/2022   GFRAA >60 01/24/2020    Lab Results  Component Value Date   CEA1 287.0 (H) 02/11/2022   CEA 791.51 (H) 09/20/2022   ZOX096 88 (H) 02/11/2022      Medications: I have reviewed the patient's current medications.   Assessment/Plan: .Metastatic adenocarcinoma, likely esophagus primary - Liver mass, mass or lymph node conglomerate near the GE junction, gastrohepatic ligament and  retroperitoneal lymphadenopathy. -02/11/2022 CTA chest-7.8 x 8.5 cm subtle hypoattenuating mass lesion in segment IV of the liver. -02/11/2022 CT abdomen/pelvis-8.7 x 7.8 or ill-defined irregular mass in the medial segment left liver, 4.5 x 3.4 cm necrotic mass lesion just cranial to the esophagogastric junction with metastatic lymphadenopathy in the abdomen and pelvis, irregular nodular peritoneal thickening in the lower abdomen and pelvis -02/11/2022 MRI abdomen-rim-enhancing likely necrotic mass or lymph node conglomerate centered around the gastroesophageal junction measuring 5.0 x 3.9 cm, large rim-enhancing likely necrotic liver mass centered in the anterior left lobe of the liver measuring 7.8 x 7.2 cm, enlarged gastrohepatic ligament and retroperitoneal lymph nodes -Labs from 02/11/2022-AFP 2.4, CA 19.9 88, CEA 287 -Ultrasound-guided liver biopsy performed 02/15/2022-adenocarcinoma with extensive necrosis, CK7 positive, negative for CK20, CDX2, TTF-1, and PAX8, consistent with a pancreatic, lung, or upper gastrointestinal primary -PET 03/08/2022-hypermetabolic distal esophagus mass, hypermetabolic centrally necrotic hepatic mass, hypermetabolic periportal nodes, exophytic GE junction mass without hypermetabolism-likely benign lesion, no hypermetabolism in the pancreas head, bilateral hypermetabolic adrenal gland lesions, small hypermetabolic left parotid gland lesion.  No loss of mismatch repair protein expression, HER2 3+ -CTs 03/11/2022-no change from PET 03/08/2022, known distal esophagus mass-not visualized on unenhanced CT, large hepatic metastasis and upper abdominal nodal metastases, chronic pancreatitis with 4.3 cm pseudocyst in the pancreas head -Cycle 1 FOLFOX, trastuzumab, Pembrolizumab 04/06/2022 -Cycle 2 oxaliplatin, 5-FU bolus/pump and trastuzumab held due to hospitalization following cycle 1 with possible  coronary vasospasm -Pembrolizumab 04/25/2022 -CT abdomen/pelvis 05/02/2022-increased  peripancreatic edema, increased size of pancreas head pseudocyst, increased necrotic nodal masses at the GE junction -Cycle 3 oxaliplatin, trastuzumab, pembrolizumab 05/15/2022; 5-FU held secondary to cardiac toxicity following cycle 1 -Cycle 4 oxaliplatin, trastuzumab 05/29/2022, 5-FU held secondary to cardiac toxicity -Cycle 5 oxaliplatin, trastuzumab, Pembrolizumab (6-week dosing for Pembrolizumab) 06/12/2022, 5-FU held secondary to cardiac toxicity -Cycle 6 oxaliplatin, trastuzumab 06/28/2022, 5-FU held -Cycle 7 oxaliplatin, trastuzumab 07/17/2022, 5-FU held -CTs 07/19/2022-decrease in the dominant anterior liver mass, 2 new small liver lesions compared to 05/02/2022, new right lower lobe nodule, enlargement of epicardial lymph nodes, slight decrease in size of celiac axis, gastropathic ligament, and portacaval lymphadenopathy, unchanged esophageal thickening -Cycle 8 oxaliplatin and pembrolizumab 08/14/2022, 5-FU and trastuzumab held secondary to potential cardiac toxicity -CTs 09/12/2022-preliminary review consistent with disease progression -Cycle 1 Taxol/cyramza 09/20/2022 -Cycle 2 Taxol/Cyramza 10/04/2022 -Cycle 3 Taxol/Cyramza 10/19/2022 (Taxol dose reduced secondary to thrombocytopenia)  2.  Myocardial infarction/cardiac arrest 01/31/2022 -Echocardiogram 02/01/2022-LVEF 60-65%, right ventricle severely dilated with hypokinesis in the basal and midportion, hypokinesis in the apical portion, right ventricular systolic function moderately reduced -Echocardiogram on 05/01/2022, LVEF 50-55%, decreased right ventricular systolic function, moderately enlarged right ventricle -Echocardiogram 08/05/2022-LVEF 45-50%, mildly decreased left ventricular function, moderate reduced right ventricular function 3.  Microcytic anemia -Labs from 02/11/2022-ferritin 151, iron 13, percent saturation 5% 4.  DVT/pulmonary embolism-bilateral lower extremity DVTs 01/10/2022, pulmonary emboli on chest CT 01/31/2022-apixaban 5.  PAD  status post AKA in 2021 6.  RA 7.  Bilateral pleural effusions -Ultrasound-guided thoracentesis performed on 02/11/2022.  Cytology negative for malignancy. 8.  COPD 9.  Admission 03/11/2022 with E. coli bacteremia Ultrasound-guided biopsy/aspiration of dominant necrotic appearing liver lesion 03/17/2022-scant fluid aspirated-negative culture, no organisms or white cells.  Pathology with necrotic tissue. 10.  Admission 04/29/2022 after a fall, multiple skull fractures 11.  Fever, hypotension-Klebsiella bacteremia on blood culture 04/30/2022 12.  Admission 04/09/2022 with chest pain, NSTEMI 13.  Admission 08/04/2022 with ventricular tachycardia, status post cardioversion, noted to have hypokalemia and hypomagnesemia, started on amiodarone    Disposition: [name] [name] appears stable.  He has completed 2 cycles with Taxol/Cyramza.  He has tolerated the treatment well, but he had nausea and vomiting following the last cycle of chemotherapy.  Aloxi will be added to the antiemetic premedication regimen today.  He will use Compazine as needed for nausea over the next few days.  He will contact us for nausea following this cycle of chemotherapy.  [name] [name] has mild thrombocytopenia secondary to chemotherapy.  Methotrexate may be contributing.  He will hold methotrexate.  The Taxol will be dose reduced beginning with chemotherapy today.  He will call for bleeding.  [name] [name] will return for an office visit in another cycle of chemotherapy in 2 weeks.  He will be scheduled for a restaging CT evaluation after 5 or 6 treatments with Taxol/ramucirumab.  Thornton Papas, MD  10/19/2022  8:56 AM

## 2022-10-20 DIAGNOSIS — Z79899 Other long term (current) drug therapy: Secondary | ICD-10-CM | POA: Diagnosis not present

## 2022-10-26 ENCOUNTER — Telehealth: Payer: Self-pay | Admitting: *Deleted

## 2022-10-26 DIAGNOSIS — Z9181 History of falling: Secondary | ICD-10-CM | POA: Diagnosis not present

## 2022-10-26 DIAGNOSIS — I472 Ventricular tachycardia, unspecified: Secondary | ICD-10-CM | POA: Diagnosis not present

## 2022-10-26 DIAGNOSIS — I252 Old myocardial infarction: Secondary | ICD-10-CM | POA: Diagnosis not present

## 2022-10-26 DIAGNOSIS — J449 Chronic obstructive pulmonary disease, unspecified: Secondary | ICD-10-CM | POA: Diagnosis not present

## 2022-10-26 DIAGNOSIS — J9601 Acute respiratory failure with hypoxia: Secondary | ICD-10-CM | POA: Diagnosis not present

## 2022-10-26 DIAGNOSIS — Z86718 Personal history of other venous thrombosis and embolism: Secondary | ICD-10-CM | POA: Diagnosis not present

## 2022-10-26 DIAGNOSIS — I10 Essential (primary) hypertension: Secondary | ICD-10-CM | POA: Diagnosis not present

## 2022-10-26 DIAGNOSIS — D630 Anemia in neoplastic disease: Secondary | ICD-10-CM | POA: Diagnosis not present

## 2022-10-26 DIAGNOSIS — M069 Rheumatoid arthritis, unspecified: Secondary | ICD-10-CM | POA: Diagnosis not present

## 2022-10-26 DIAGNOSIS — K219 Gastro-esophageal reflux disease without esophagitis: Secondary | ICD-10-CM | POA: Diagnosis not present

## 2022-10-26 DIAGNOSIS — M5137 Other intervertebral disc degeneration, lumbosacral region: Secondary | ICD-10-CM | POA: Diagnosis not present

## 2022-10-26 DIAGNOSIS — J439 Emphysema, unspecified: Secondary | ICD-10-CM | POA: Diagnosis not present

## 2022-10-26 DIAGNOSIS — Z79891 Long term (current) use of opiate analgesic: Secondary | ICD-10-CM | POA: Diagnosis not present

## 2022-10-26 DIAGNOSIS — I2510 Atherosclerotic heart disease of native coronary artery without angina pectoris: Secondary | ICD-10-CM | POA: Diagnosis not present

## 2022-10-26 DIAGNOSIS — Z993 Dependence on wheelchair: Secondary | ICD-10-CM | POA: Diagnosis not present

## 2022-10-26 DIAGNOSIS — Z8616 Personal history of COVID-19: Secondary | ICD-10-CM | POA: Diagnosis not present

## 2022-10-26 DIAGNOSIS — E43 Unspecified severe protein-calorie malnutrition: Secondary | ICD-10-CM | POA: Diagnosis not present

## 2022-10-26 DIAGNOSIS — Z8673 Personal history of transient ischemic attack (TIA), and cerebral infarction without residual deficits: Secondary | ICD-10-CM | POA: Diagnosis not present

## 2022-10-26 DIAGNOSIS — C771 Secondary and unspecified malignant neoplasm of intrathoracic lymph nodes: Secondary | ICD-10-CM | POA: Diagnosis not present

## 2022-10-26 DIAGNOSIS — Z89612 Acquired absence of left leg above knee: Secondary | ICD-10-CM | POA: Diagnosis not present

## 2022-10-26 DIAGNOSIS — I739 Peripheral vascular disease, unspecified: Secondary | ICD-10-CM | POA: Diagnosis not present

## 2022-10-26 DIAGNOSIS — Z791 Long term (current) use of non-steroidal anti-inflammatories (NSAID): Secondary | ICD-10-CM | POA: Diagnosis not present

## 2022-10-26 DIAGNOSIS — Z7902 Long term (current) use of antithrombotics/antiplatelets: Secondary | ICD-10-CM | POA: Diagnosis not present

## 2022-10-26 NOTE — Telephone Encounter (Signed)
Received call from Sheria Lang, OT with [facility] requesting RN Eval for wound on sacrum and lower back. Verbal auth given. Will route to [facility] for agreement/denial.  Also, requesting PCS. Will forward to [facility] coordinator. Patient's last OV was 09/27/22.  Sheria Lang has sent over orders needing urgent signature for:  Gel overlay mattress Incontinence supplies Wheelchair cushion

## 2022-10-27 ENCOUNTER — Telehealth: Payer: Self-pay

## 2022-10-27 ENCOUNTER — Ambulatory Visit (INDEPENDENT_AMBULATORY_CARE_PROVIDER_SITE_OTHER): Payer: 59 | Admitting: Student

## 2022-10-27 ENCOUNTER — Encounter: Payer: Self-pay | Admitting: Student

## 2022-10-27 DIAGNOSIS — C801 Malignant (primary) neoplasm, unspecified: Secondary | ICD-10-CM | POA: Diagnosis not present

## 2022-10-27 DIAGNOSIS — I739 Peripheral vascular disease, unspecified: Secondary | ICD-10-CM | POA: Diagnosis not present

## 2022-10-27 DIAGNOSIS — E43 Unspecified severe protein-calorie malnutrition: Secondary | ICD-10-CM | POA: Diagnosis not present

## 2022-10-27 DIAGNOSIS — I252 Old myocardial infarction: Secondary | ICD-10-CM | POA: Diagnosis not present

## 2022-10-27 DIAGNOSIS — Z791 Long term (current) use of non-steroidal anti-inflammatories (NSAID): Secondary | ICD-10-CM | POA: Diagnosis not present

## 2022-10-27 DIAGNOSIS — Z8673 Personal history of transient ischemic attack (TIA), and cerebral infarction without residual deficits: Secondary | ICD-10-CM | POA: Diagnosis not present

## 2022-10-27 DIAGNOSIS — J9601 Acute respiratory failure with hypoxia: Secondary | ICD-10-CM | POA: Diagnosis not present

## 2022-10-27 DIAGNOSIS — M5137 Other intervertebral disc degeneration, lumbosacral region: Secondary | ICD-10-CM | POA: Diagnosis not present

## 2022-10-27 DIAGNOSIS — Z8616 Personal history of COVID-19: Secondary | ICD-10-CM | POA: Diagnosis not present

## 2022-10-27 DIAGNOSIS — Z86718 Personal history of other venous thrombosis and embolism: Secondary | ICD-10-CM | POA: Diagnosis not present

## 2022-10-27 DIAGNOSIS — I2510 Atherosclerotic heart disease of native coronary artery without angina pectoris: Secondary | ICD-10-CM | POA: Diagnosis not present

## 2022-10-27 DIAGNOSIS — Z993 Dependence on wheelchair: Secondary | ICD-10-CM | POA: Diagnosis not present

## 2022-10-27 DIAGNOSIS — K219 Gastro-esophageal reflux disease without esophagitis: Secondary | ICD-10-CM | POA: Diagnosis not present

## 2022-10-27 DIAGNOSIS — J439 Emphysema, unspecified: Secondary | ICD-10-CM | POA: Diagnosis not present

## 2022-10-27 DIAGNOSIS — Z7902 Long term (current) use of antithrombotics/antiplatelets: Secondary | ICD-10-CM | POA: Diagnosis not present

## 2022-10-27 DIAGNOSIS — Z79891 Long term (current) use of opiate analgesic: Secondary | ICD-10-CM | POA: Diagnosis not present

## 2022-10-27 DIAGNOSIS — M069 Rheumatoid arthritis, unspecified: Secondary | ICD-10-CM | POA: Diagnosis not present

## 2022-10-27 DIAGNOSIS — I10 Essential (primary) hypertension: Secondary | ICD-10-CM | POA: Diagnosis not present

## 2022-10-27 DIAGNOSIS — D630 Anemia in neoplastic disease: Secondary | ICD-10-CM | POA: Diagnosis not present

## 2022-10-27 DIAGNOSIS — Z89612 Acquired absence of left leg above knee: Secondary | ICD-10-CM | POA: Diagnosis not present

## 2022-10-27 DIAGNOSIS — I472 Ventricular tachycardia, unspecified: Secondary | ICD-10-CM | POA: Diagnosis not present

## 2022-10-27 DIAGNOSIS — Z9181 History of falling: Secondary | ICD-10-CM | POA: Diagnosis not present

## 2022-10-27 DIAGNOSIS — J449 Chronic obstructive pulmonary disease, unspecified: Secondary | ICD-10-CM | POA: Diagnosis not present

## 2022-10-27 DIAGNOSIS — C771 Secondary and unspecified malignant neoplasm of intrathoracic lymph nodes: Secondary | ICD-10-CM | POA: Diagnosis not present

## 2022-10-27 NOTE — Progress Notes (Signed)
  [facility] Internal Medicine Residency Telephone Encounter Continuity Care Appointment  HPI:  This telephone encounter was created for [name] on 10/27/2022 for the following purpose/cc diarrhea.   Past Medical History:  Past Medical History:  Diagnosis Date   Acute respiratory failure (HCC) 01/31/2022   Bilateral shoulder pain 08/20/2012   Bite wound of left hand 09/27/2021   CAD (coronary artery disease)    Occluded nondominent RCA managed medically - August 2023   Cardiac arrest [facility])    OOH VF - August 2023   Community acquired pneumonia 11/07/2011   Critical limb ischemia with history of revascularization of same extremity (HCC) 04/28/2020   DDD (degenerative disc disease), lumbosacral     and grade 2 slip   DVT (deep venous thrombosis) (HCC) 01/31/2022   Esophageal cancer (HCC)    Adenocarcinoma with metastasis   GERD (gastroesophageal reflux disease)    History of anemia    History of COVID-19 05/10/2019   History of critical lower limb ischemia 02/19/2019   Hypertension    Impingement syndrome of shoulder region    Ischemic ulcer of toe of left foot (HCC)    Liver mass    Low back pain without sciatica    Lupus (HCC)    Multiple fractures of ribs, bilateral, due to CPR trauma    Onychomycosis    Paronychia of great toe    Peripheral vascular disease (HCC)    Pressure injury of skin    Pulmonary embolus (HCC)    RA (rheumatoid arthritis) (HCC)    Rotator cuff tear 11/04/2014   Sternal fracture    Stroke (HCC)      ROS:  Diarrhea, rash on buttocks   Assessment / Plan / Recommendations:  Please see A&P under problem oriented charting for assessment of the patient's acute and chronic medical conditions.  As always, pt is advised that if symptoms worsen or new symptoms arise, they should go to an urgent care facility or to to ER for further evaluation.   Consent and Medical Decision Making:  Patient discussed with Dr.  Lafonda Mosses This is a telephone  encounter between [name] and Quincy Simmonds on 10/27/2022 for diarrhea for the past week. The visit was conducted with the patient located at home and Quincy Simmonds at [facility]. The patient's identity was confirmed using their DOB and current address. The  patient and caretaker [name]  has consented to being evaluated through a telephone encounter and understands the associated risks (an examination cannot be done and the patient may need to come in for an appointment) / benefits (allows the patient to remain at home, decreasing exposure to coronavirus). I personally spent 28 minutes on medical discussion.

## 2022-10-27 NOTE — Assessment & Plan Note (Signed)
Telehealth visit with patient and caretaker for 1 week of soft loose stools and diarrhea.  Having episodes 2-3 times a day sometimes liquid sometimes soft.  Has been having difficulty using the bedside commode and often soils himself in his hospital bed.  Now with rash of his buttocks without open wounds.  Suspect this is most related to his new diarrhea.  He is currently on Taxol and Cyramza since 09/20/2022.  His last dose was 10/19/2022.  Both of these medications can cause diarrhea.  Symptoms did seem to start after last dose of chemotherapy.  He has contacted oncology office regarding this.  He denies fevers chills, abdominal pain, nausea, vomiting, dizziness, or lightheadedness.    Blood pressure today was 105/80 with home cuff.  Otherwise is eating and drinking well.  Somewhat decreased activity levels in the last few days but overall not feeling ill.  Given he remains feeling well and is tolerating good p.o. intake will have him and caretaker observe over the weekend and have him follow-up in person clinic for further evaluation of diarrhea and rash.  I suspect his symptoms are due to adverse reaction to chemotherapy however he is at increased risk for infection as he is currently on chemotherapy and previously on methotrexate.  Patient is currently taking chlorthalidone for hypertension.  This seems to have fallen off his med list but patient and caretaker endorse that he has been taking this regularly.  Do think he is at increased risk for dehydration with his diarrhea and will have him hold this over the weekend.  His caretaker [name] requesting DME for checks, briefs, gloves, and wipes to help with cleaning the patient.  Patient spends most of time in his hospital bed or in his wheelchair..  Home health RN also recommend gel overlay mattress and wheelchair cushion to prevent pressure ulcers.  He is at risk for moisture related and pressure related tissue injury due to difficulty using commode and time  he spends in bed or wheelchair.  Follow-up in person for diarrhea and rash on 10/30/2022 Hold chlorthalidone over a week and until office visit DME paperwork signed for chuck pads, briefs, gloves, wipes DME order placed for gel pad and wheelchair cushion

## 2022-10-27 NOTE — Telephone Encounter (Signed)
The [facility] nurse reported that the patient has a rash extending from the mid of his back down to his groin area. After speaking with the patient and his caregiver, it was confirmed that the rash is red and itchy. They mentioned using Desitin on the rash. The patient expressed a desire for the provider to prescribe medication to help with the rash. I explained that a provider would need to examine the rash before prescribing any medication. I advised the patient to schedule an appointment with their provider or go to urgent care for evaluation. The patient indicated they would call to schedule an appointment.

## 2022-10-28 ENCOUNTER — Other Ambulatory Visit: Payer: Self-pay | Admitting: Oncology

## 2022-10-30 ENCOUNTER — Encounter: Payer: Self-pay | Admitting: Student

## 2022-10-30 ENCOUNTER — Encounter (HOSPITAL_COMMUNITY): Payer: Self-pay

## 2022-10-30 ENCOUNTER — Other Ambulatory Visit: Payer: Self-pay

## 2022-10-30 ENCOUNTER — Encounter: Payer: 59 | Admitting: Student

## 2022-10-30 ENCOUNTER — Ambulatory Visit: Payer: 59

## 2022-10-30 ENCOUNTER — Ambulatory Visit (INDEPENDENT_AMBULATORY_CARE_PROVIDER_SITE_OTHER): Payer: 59 | Admitting: Student

## 2022-10-30 VITALS — BP 94/67 | HR 68 | Temp 98.0°F

## 2022-10-30 VITALS — BP 101/58 | HR 75 | Temp 98.1°F

## 2022-10-30 DIAGNOSIS — C799 Secondary malignant neoplasm of unspecified site: Secondary | ICD-10-CM | POA: Diagnosis not present

## 2022-10-30 DIAGNOSIS — G894 Chronic pain syndrome: Secondary | ICD-10-CM | POA: Diagnosis not present

## 2022-10-30 DIAGNOSIS — I252 Old myocardial infarction: Secondary | ICD-10-CM | POA: Diagnosis not present

## 2022-10-30 DIAGNOSIS — R21 Rash and other nonspecific skin eruption: Secondary | ICD-10-CM | POA: Insufficient documentation

## 2022-10-30 DIAGNOSIS — E785 Hyperlipidemia, unspecified: Secondary | ICD-10-CM | POA: Diagnosis not present

## 2022-10-30 DIAGNOSIS — Z8674 Personal history of sudden cardiac arrest: Secondary | ICD-10-CM | POA: Diagnosis not present

## 2022-10-30 DIAGNOSIS — I10 Essential (primary) hypertension: Secondary | ICD-10-CM | POA: Diagnosis not present

## 2022-10-30 DIAGNOSIS — M069 Rheumatoid arthritis, unspecified: Secondary | ICD-10-CM | POA: Diagnosis not present

## 2022-10-30 DIAGNOSIS — I959 Hypotension, unspecified: Secondary | ICD-10-CM | POA: Diagnosis not present

## 2022-10-30 DIAGNOSIS — R6889 Other general symptoms and signs: Secondary | ICD-10-CM | POA: Diagnosis not present

## 2022-10-30 DIAGNOSIS — T451X5A Adverse effect of antineoplastic and immunosuppressive drugs, initial encounter: Secondary | ICD-10-CM | POA: Diagnosis not present

## 2022-10-30 DIAGNOSIS — Z86718 Personal history of other venous thrombosis and embolism: Secondary | ICD-10-CM | POA: Diagnosis not present

## 2022-10-30 DIAGNOSIS — Z86711 Personal history of pulmonary embolism: Secondary | ICD-10-CM | POA: Diagnosis not present

## 2022-10-30 DIAGNOSIS — Z7901 Long term (current) use of anticoagulants: Secondary | ICD-10-CM | POA: Diagnosis not present

## 2022-10-30 DIAGNOSIS — I739 Peripheral vascular disease, unspecified: Secondary | ICD-10-CM | POA: Diagnosis not present

## 2022-10-30 DIAGNOSIS — M0579 Rheumatoid arthritis with rheumatoid factor of multiple sites without organ or systems involvement: Secondary | ICD-10-CM

## 2022-10-30 DIAGNOSIS — L271 Localized skin eruption due to drugs and medicaments taken internally: Secondary | ICD-10-CM | POA: Diagnosis not present

## 2022-10-30 DIAGNOSIS — Z87820 Personal history of traumatic brain injury: Secondary | ICD-10-CM | POA: Diagnosis not present

## 2022-10-30 DIAGNOSIS — Z79899 Other long term (current) drug therapy: Secondary | ICD-10-CM | POA: Diagnosis not present

## 2022-10-30 DIAGNOSIS — C159 Malignant neoplasm of esophagus, unspecified: Secondary | ICD-10-CM | POA: Diagnosis not present

## 2022-10-30 DIAGNOSIS — R238 Other skin changes: Secondary | ICD-10-CM | POA: Diagnosis not present

## 2022-10-30 DIAGNOSIS — I2510 Atherosclerotic heart disease of native coronary artery without angina pectoris: Secondary | ICD-10-CM | POA: Diagnosis not present

## 2022-10-30 DIAGNOSIS — L738 Other specified follicular disorders: Secondary | ICD-10-CM | POA: Diagnosis not present

## 2022-10-30 LAB — CBC WITH DIFFERENTIAL/PLATELET
Abs Immature Granulocytes: 0.02 10*3/uL (ref 0.00–0.07)
Basophils Absolute: 0 10*3/uL (ref 0.0–0.1)
Basophils Relative: 1 %
Eosinophils Absolute: 0.1 10*3/uL (ref 0.0–0.5)
Eosinophils Relative: 3 %
HCT: 38.9 % — ABNORMAL LOW (ref 39.0–52.0)
Hemoglobin: 11.8 g/dL — ABNORMAL LOW (ref 13.0–17.0)
Immature Granulocytes: 1 %
Lymphocytes Relative: 25 %
Lymphs Abs: 1.1 10*3/uL (ref 0.7–4.0)
MCH: 28.9 pg (ref 26.0–34.0)
MCHC: 30.3 g/dL (ref 30.0–36.0)
MCV: 95.3 fL (ref 80.0–100.0)
Monocytes Absolute: 0.4 10*3/uL (ref 0.1–1.0)
Monocytes Relative: 8 %
Neutro Abs: 2.8 10*3/uL (ref 1.7–7.7)
Neutrophils Relative %: 62 %
Platelets: 178 10*3/uL (ref 150–400)
RBC: 4.08 MIL/uL — ABNORMAL LOW (ref 4.22–5.81)
RDW: 18.1 % — ABNORMAL HIGH (ref 11.5–15.5)
WBC: 4.4 10*3/uL (ref 4.0–10.5)
nRBC: 0 % (ref 0.0–0.2)

## 2022-10-30 LAB — COMPREHENSIVE METABOLIC PANEL
ALT: 20 U/L (ref 0–44)
AST: 27 U/L (ref 15–41)
Albumin: 2.8 g/dL — ABNORMAL LOW (ref 3.5–5.0)
Alkaline Phosphatase: 204 U/L — ABNORMAL HIGH (ref 38–126)
Anion gap: 12 (ref 5–15)
BUN: 11 mg/dL (ref 8–23)
CO2: 21 mmol/L — ABNORMAL LOW (ref 22–32)
Calcium: 8.7 mg/dL — ABNORMAL LOW (ref 8.9–10.3)
Chloride: 104 mmol/L (ref 98–111)
Creatinine, Ser: 0.96 mg/dL (ref 0.61–1.24)
GFR, Estimated: >60 mL/min (ref >60)
Glucose, Bld: 69 mg/dL — ABNORMAL LOW (ref 70–99)
Potassium: 4.5 mmol/L (ref 3.5–5.1)
Sodium: 137 mmol/L (ref 135–145)
Total Bilirubin: 0.5 mg/dL (ref 0.3–1.2)
Total Protein: 6.5 g/dL (ref 6.5–8.1)

## 2022-10-30 MED ORDER — MELOXICAM 7.5 MG PO TABS
7.5000 mg | ORAL_TABLET | Freq: Every day | ORAL | 3 refills | Status: DC | PRN
Start: 2022-10-30 — End: 2022-11-30

## 2022-10-30 NOTE — Progress Notes (Addendum)
Internal Medicine Clinic Attending  I saw and evaluated the patient.  I personally confirmed the key portions of the history and exam documented by Dr. Allena Katz and I reviewed pertinent patient test results.  The assessment, diagnosis, and plan were formulated together and I agree with the documentation in the resident's note.   Patient is here with acute polymorphic rash involving his face, chest, back, and buttocks. Face is covered with vesicular / hemorrhagic lesions and the rash on his buttocks and trunk are erythematous and desquamative. The rash developed acutely 4 days ago. He is hypotensive here. He recently started Paclitaxel chemotherapy and cyramza treatment for metastatic carcinoma of unknown etiology, thought to be likely gastroesophageal cancer. Last dose was on 10/19/2022. He also has rheumatoid arthritis previously on methotrexate but this has been on hold since he started chemotherapy. Overall I am concerned for steven johnson syndrome. My resident Dr. Allena Katz discussed our concerns with his oncologist Dr. Truett Perna over the phone who recommended direct admission. We called and discussed with triad hospitalist group at [facility] requesting admission (where our cancer services are located - teaching service does not have admitting privileges there) and they have declined the admission to due concerns for SJS; recommended admission to tertiary care center with burn unit where he can be better served. We have instructed him to present to the [facility] ED (closest center) and we have informed Dr. Truett Perna. Dr. Allena Katz has called [facility] ED to inform them that he is coming; spoke with the EDP.

## 2022-10-30 NOTE — Progress Notes (Signed)
Internal Medicine Clinic Attending ? ?Case discussed with Dr. Liang  At the time of the visit.  We reviewed the resident?s history and exam and pertinent patient test results.  I agree with the assessment, diagnosis, and plan of care documented in the resident?s note. ? ?

## 2022-10-30 NOTE — Progress Notes (Signed)
CC: Rash  HPI:  [name] is a 62 y.o. male with a past medical history of metastatic esophageal cancer who presents for concerns of a rash.  Please see assessment and plan for full HPI.  Past Medical History:  Diagnosis Date   Acute respiratory failure (HCC) 01/31/2022   Bilateral shoulder pain 08/20/2012   Bite wound of left hand 09/27/2021   CAD (coronary artery disease)    Occluded nondominent RCA managed medically - August 2023   Cardiac arrest [facility])    OOH VF - August 2023   Community acquired pneumonia 11/07/2011   Critical limb ischemia with history of revascularization of same extremity (HCC) 04/28/2020   DDD (degenerative disc disease), lumbosacral     and grade 2 slip   DVT (deep venous thrombosis) (HCC) 01/31/2022   Esophageal cancer (HCC)    Adenocarcinoma with metastasis   GERD (gastroesophageal reflux disease)    History of anemia    History of COVID-19 05/10/2019   History of critical lower limb ischemia 02/19/2019   Hypertension    Impingement syndrome of shoulder region    Ischemic ulcer of toe of left foot (HCC)    Liver mass    Low back pain without sciatica    Lupus (HCC)    Multiple fractures of ribs, bilateral, due to CPR trauma    Onychomycosis    Paronychia of great toe    Peripheral vascular disease (HCC)    Pressure injury of skin    Pulmonary embolus (HCC)    RA (rheumatoid arthritis) (HCC)    Rotator cuff tear 11/04/2014   Sternal fracture    Stroke [facility])      Current Outpatient Medications:    albuterol (VENTOLIN HFA) 108 (90 Base) MCG/ACT inhaler, Inhale 2 puffs into the lungs every 6 (six) hours as needed for wheezing or shortness of breath., Disp: 6.7 g, Rfl: 2   amiodarone (PACERONE) 200 MG tablet, Take 1 tablet (200 mg total) by mouth 2 (two) times daily., Disp: 60 tablet, Rfl: 11   amLODipine (NORVASC) 10 MG tablet, Take 1 tablet (10 mg total) by mouth daily., Disp: 30 tablet, Rfl: 0   apixaban (ELIQUIS) 5 MG TABS tablet,  Take 1 tablet (5 mg total) by mouth 2 (two) times daily., Disp: 60 tablet, Rfl: 0   atorvastatin (LIPITOR) 40 MG tablet, Take 1 tablet (40 mg total) by mouth daily at 6 PM., Disp: 30 tablet, Rfl: 0   clopidogrel (PLAVIX) 75 MG tablet, Take 75 mg by mouth daily., Disp: , Rfl:    diclofenac Sodium (VOLTAREN) 1 % GEL, Apply 2 g topically 4 (four) times daily., Disp: 200 g, Rfl: 0   DULoxetine (CYMBALTA) 30 MG capsule, Take 30 mg by mouth daily as needed (sleep)., Disp: , Rfl:    feeding supplement (ENSURE ENLIVE / ENSURE PLUS) LIQD, Take 237 mLs by mouth 3 (three) times daily between meals., Disp: 237 mL, Rfl: 12   ferrous sulfate 325 (65 FE) MG tablet, Take 1 tablet (325 mg total) by mouth daily with breakfast for 100 doses., Disp: 100 tablet, Rfl: 0   fluticasone-salmeterol (ADVAIR HFA) 45-21 MCG/ACT inhaler, Inhale 2 puffs into the lungs 2 (two) times daily., Disp: 1 each, Rfl: 12   folic acid (FOLVITE) 1 MG tablet, Take 1 tablet (1 mg total) by mouth daily., Disp: 90 tablet, Rfl: 3   gabapentin (NEURONTIN) 100 MG capsule, Take 1 capsule (100 mg total) by mouth See admin instructions. Daily or twice  daily if tolerated. (Patient taking differently: Take 100-200 mg by mouth daily.), Disp: 90 capsule, Rfl: 0   isosorbide mononitrate (IMDUR) 30 MG 24 hr tablet, Take 1 tablet (30 mg total) by mouth daily., Disp: 30 tablet, Rfl: 0   lidocaine-prilocaine (EMLA) cream, Apply 1 Application topically as directed. Apply to port site 2 hours prior to stick and cover with Press-and-Seal to numb port before access, Disp: 30 g, Rfl: 1   meloxicam (MOBIC) 7.5 MG tablet, Take 1 tablet (7.5 mg total) by mouth daily as needed for pain., Disp: 30 tablet, Rfl: 3   methotrexate (RHEUMATREX) 2.5 MG tablet, Take 3 tablets (7.5 mg total) by mouth once a week. Caution:Chemotherapy. Protect from light., Disp: 4 tablet, Rfl: 0   ondansetron (ZOFRAN) 8 MG tablet, Start taking 72 hours after chemotherapy treatment., Disp: 30  tablet, Rfl: 1   Oxycodone HCl 10 MG TABS, Take 10 mg by mouth every 6 (six) hours., Disp: , Rfl:    potassium chloride (KLOR-CON M10) 10 MEQ tablet, TAKE 2 TABLETS(20 MEQ) BY MOUTH DAILY, Disp: 180 tablet, Rfl: 0   prochlorperazine (COMPAZINE) 10 MG tablet, Take 1 tablet (10 mg total) by mouth every 6 (six) hours as needed., Disp: 60 tablet, Rfl: 1  Review of Systems:   Skin: Patient endorses rash  Physical Exam:  Vitals:   10/30/22 1100 10/30/22 1109 10/30/22 1129  BP: (!) 85/60 94/67 (!) 101/58  Pulse: 81 68 75  Temp: 98.1 F (36.7 C)    SpO2: 98%      General: Patient is sitting comfortably in the room, chronically ill-appearing Head: Normocephalic, atraumatic  Cardio: Regular rate and rhythm, no murmurs, rubs or gallops. 2+ pulses to bilateral upper and lower extremities  Pulmonary: Clear to ausculation bilaterally with no rales, rhonchi, and crackles  Skin: Face noted with pustular erythematous rash.  Packed with sloughing skin noted throughout.  Bilateral buttocks with erythematous Desquamative rash noted with warmth noted    Assessment & Plan:   Rash Patient presents with a 4 to 5-day history of new rash that started on patient's head, back, and buttocks.  Patient states this initially started as diarrhea, and then patient developed a rash.  Patient is at bedside with caretakers who states that the rash has improved.  Denies any worsening of the rash.  They denied any drainage from the rash.  They do state that the skin is peeling with significant skin peeling on his entire back.  On exam, I appreciate a erythematous the squamous rash noted to bilateral buttocks.  Also appreciated peeling to the entire posterior back.  I also appreciate pustules noted to right side of the face.  I do have some concern for Stevens-Johnson syndrome as patient recently did change his chemotherapy agents to Taxol and Cyramza. Patient now has a significant amount of sloughing of his skin noted to his  back and his buttocks.  Initially, plan was to admit to [facility], but hospital stated, if concern for Stevens-Johnson syndrome, will likely need a tertiary care center.  Patient directed towards [facility] emergency department for further evaluation and management.  Plan: -Concern for Stevens-Johnson syndrome -Patient directed to [facility] emergency department for admission -Spoke with oncologist Dr. Truett Perna who agreed with admission -Encourage patient to report any fevers if patient develops any fevers -Volume resuscitate patient with p.o. fluids worked well with blood pressure coming back to baseline.  Rheumatoid arthritis (HCC) Currently methotrexate on hold given reaction with chemotherapy.  Patient denies  any fevers or chills. Patient states he needs refill on meloxicam.  He has no other concerns at this time.  Plan: -Refill meloxicam -Decision to resume methotrexate per oncology  Hypotension Patient with soft blood pressures during clinic visit today.  Initial blood pressure was 85/60.  Looking back through previous charts, patient has had some soft blood pressures, but never this low.  Patient denies any vision changes, headaches, lightheadedness, dizziness.  Unclear etiology, but could be due to volume losses due to diarrhea as well as new skin sloughing that has been noted.  Will plan to give patient some fluids.  Plan: -Hold home antihypertensives (amlodipine and chlorthalidone) -Volume resuscitate with p.o. fluids -Plan to admit patient, directed patient to [facility] emergency department  Patient seen with Dr. Oren Bracket, DO PGY-1 Internal Medicine Resident  Pager: [phone]

## 2022-10-30 NOTE — Patient Instructions (Signed)
Health Maintenance, Male Adopting a healthy lifestyle and getting preventive care are important in promoting health and wellness. Ask your health care provider about: The right schedule for you to have regular tests and exams. Things you can do on your own to prevent diseases and keep yourself healthy. What should I know about diet, weight, and exercise? Eat a healthy diet  Eat a diet that includes plenty of vegetables, fruits, low-fat dairy products, and lean protein. Do not eat a lot of foods that are high in solid fats, added sugars, or sodium. Maintain a healthy weight Body mass index (BMI) is a measurement that can be used to identify possible weight problems. It estimates body fat based on height and weight. Your health care provider can help determine your BMI and help you achieve or maintain a healthy weight. Get regular exercise Get regular exercise. This is one of the most important things you can do for your health. Most adults should: Exercise for at least 150 minutes each week. The exercise should increase your heart rate and make you sweat (moderate-intensity exercise). Do strengthening exercises at least twice a week. This is in addition to the moderate-intensity exercise. Spend less time sitting. Even light physical activity can be beneficial. Watch cholesterol and blood lipids Have your blood tested for lipids and cholesterol at 63 years of age, then have this test every 5 years. You may need to have your cholesterol levels checked more often if: Your lipid or cholesterol levels are high. You are older than 63 years of age. You are at high risk for heart disease. What should I know about cancer screening? Many types of cancers can be detected early and may often be prevented. Depending on your health history and family history, you may need to have cancer screening at various ages. This may include screening for: Colorectal cancer. Prostate cancer. Skin cancer. Lung  cancer. What should I know about heart disease, diabetes, and high blood pressure? Blood pressure and heart disease High blood pressure causes heart disease and increases the risk of stroke. This is more likely to develop in people who have high blood pressure readings or are overweight. Talk with your health care provider about your target blood pressure readings. Have your blood pressure checked: Every 3-5 years if you are 18-39 years of age. Every year if you are 40 years old or older. If you are between the ages of 65 and 75 and are a current or former smoker, ask your health care provider if you should have a one-time screening for abdominal aortic aneurysm (AAA). Diabetes Have regular diabetes screenings. This checks your fasting blood sugar level. Have the screening done: Once every three years after age 45 if you are at a normal weight and have a low risk for diabetes. More often and at a younger age if you are overweight or have a high risk for diabetes. What should I know about preventing infection? Hepatitis B If you have a higher risk for hepatitis B, you should be screened for this virus. Talk with your health care provider to find out if you are at risk for hepatitis B infection. Hepatitis C Blood testing is recommended for: Everyone born from 1945 through 1965. Anyone with known risk factors for hepatitis C. Sexually transmitted infections (STIs) You should be screened each year for STIs, including gonorrhea and chlamydia, if: You are sexually active and are younger than 63 years of age. You are older than 63 years of age and your   health care provider tells you that you are at risk for this type of infection. Your sexual activity has changed since you were last screened, and you are at increased risk for chlamydia or gonorrhea. Ask your health care provider if you are at risk. Ask your health care provider about whether you are at high risk for HIV. Your health care provider  may recommend a prescription medicine to help prevent HIV infection. If you choose to take medicine to prevent HIV, you should first get tested for HIV. You should then be tested every 3 months for as long as you are taking the medicine. Follow these instructions at home: Alcohol use Do not drink alcohol if your health care provider tells you not to drink. If you drink alcohol: Limit how much you have to 0-2 drinks a day. Know how much alcohol is in your drink. In the [place] one drink equals one 12 oz bottle of beer (355 mL), one 5 oz glass of wine (148 mL), or one 1 oz glass of hard liquor (44 mL). Lifestyle Do not use any products that contain nicotine or tobacco. These products include cigarettes, chewing tobacco, and vaping devices, such as e-cigarettes. If you need help quitting, ask your health care provider. Do not use street drugs. Do not share needles. Ask your health care provider for help if you need support or information about quitting drugs. General instructions Schedule regular health, dental, and eye exams. Stay current with your vaccines. Tell your health care provider if: You often feel depressed. You have ever been abused or do not feel safe at home. Summary Adopting a healthy lifestyle and getting preventive care are important in promoting health and wellness. Follow your health care provider's instructions about healthy diet, exercising, and getting tested or screened for diseases. Follow your health care provider's instructions on monitoring your cholesterol and blood pressure. This information is not intended to replace advice given to you by your health care provider. Make sure you discuss any questions you have with your health care provider. Document Revised: 11/01/2020 Document Reviewed: 11/01/2020 Elsevier Patient Education  2023 [employer]  

## 2022-10-30 NOTE — Patient Instructions (Addendum)
[name] [name] you for allowing me to take part in your care today.  Here are your instructions.  1. Regarding your rash we are concerned that you might be having a major reaction to your chemotherapy and that you will need to be admitted to the hospital.   2. Please await a phone call to make sure that you have a bed at [facility].     Thank you, Dr. Allena Katz  If you have any other questions please contact the internal medicine clinic at [phone]

## 2022-10-30 NOTE — Assessment & Plan Note (Signed)
Currently methotrexate on hold given reaction with chemotherapy.  Patient denies any fevers or chills. Patient states he needs refill on meloxicam.  He has no other concerns at this time.  Plan: -Refill meloxicam -Decision to resume methotrexate per oncology

## 2022-10-30 NOTE — Addendum Note (Signed)
Addended by: Maura Crandall on: 10/30/2022 03:39 PM   Modules accepted: Orders, Level of Service

## 2022-10-30 NOTE — Assessment & Plan Note (Addendum)
Patient presents with a 4 to 5-day history of new rash that started on patient's head, back, and buttocks.  Patient states this initially started as diarrhea, and then patient developed a rash.  Patient is at bedside with caretakers who states that the rash has improved.  Denies any worsening of the rash.  They denied any drainage from the rash.  They do state that the skin is peeling with significant skin peeling on his entire back.  On exam, I appreciate a erythematous the squamous rash noted to bilateral buttocks.  Also appreciated peeling to the entire posterior back.  I also appreciate pustules noted to right side of the face.  I do have some concern for Stevens-Johnson syndrome as patient recently did change his chemotherapy agents to Taxol and Cyramza. Patient now has a significant amount of sloughing of his skin noted to his back and his buttocks.  Initially, plan was to admit to [facility], but hospital stated, if concern for Stevens-Johnson syndrome, will likely need a tertiary care center.  Patient directed towards [facility] emergency department for further evaluation and management.  Plan: -Concern for Stevens-Johnson syndrome -Patient directed to [facility] emergency department for admission -Spoke with oncologist Dr. Truett Perna who agreed with admission -Encourage patient to report any fevers if patient develops any fevers -Volume resuscitate patient with p.o. fluids worked well with blood pressure coming back to baseline.

## 2022-10-30 NOTE — Progress Notes (Signed)
This encounter was created in error - please disregard.

## 2022-10-30 NOTE — Assessment & Plan Note (Signed)
Patient with soft blood pressures during clinic visit today.  Initial blood pressure was 85/60.  Looking back through previous charts, patient has had some soft blood pressures, but never this low.  Patient denies any vision changes, headaches, lightheadedness, dizziness.  Unclear etiology, but could be due to volume losses due to diarrhea as well as new skin sloughing that has been noted.  Will plan to give patient some fluids.  Plan: -Hold home antihypertensives (amlodipine and chlorthalidone) -Volume resuscitate with p.o. fluids -Plan to admit patient, directed patient to [facility] emergency department

## 2022-10-30 NOTE — Progress Notes (Signed)
Subjective:   [name] [name] is a 62 y.o. male who presents for Medicare Annual/Subsequent preventive examination. I connected with  [name] on 10/30/22 by a  IN PERSON [facility]      Patient Location: Other:  [facility]   Provider Location: Office/Clinic  I discussed the limitations of evaluation and management by telemedicine. The patient expressed understanding and agreed to proceed.   Review of Systems    DEFERRED TO PCP  Cardiac Risk Factors include: advanced age (>49men, >12 women);hypertension;male gender     Objective:    Today's Vitals   10/30/22 1117  BP: 94/67  Pulse: 68  Temp: 98 F (36.7 C)  TempSrc: Oral  SpO2: 98%  PainSc: 0-No pain   There is no height or weight on file to calculate BMI.     10/30/2022   11:20 AM 10/30/2022   11:05 AM 10/19/2022    8:45 AM 10/04/2022    9:48 AM 09/27/2022    2:54 PM 09/20/2022    9:39 AM 09/13/2022    8:40 AM  Advanced Directives  Does Patient Have a Medical Advance Directive? Yes Yes No Yes Yes Yes Yes  Type of Advertising copywriter  Living will;Healthcare Power of [employer] Power of [place];Living will  Living will;Healthcare Power of Attorney  Does patient want to make changes to medical advance directive?   No - Patient declined No - Patient declined No - Patient declined No - Patient declined No - Patient declined  Copy of Healthcare Power of Attorney in Chart?     Yes - validated most recent copy scanned in chart (See row information)    Would patient like information on creating a medical advance directive?    No - Patient declined No - Patient declined  No - Patient declined    Current Medications (verified) Outpatient Encounter Medications as of 10/30/2022  Medication Sig   albuterol (VENTOLIN HFA) 108 (90 Base) MCG/ACT inhaler Inhale 2 puffs into the lungs every 6 (six) hours as needed for wheezing or shortness of breath.   amiodarone (PACERONE) 200 MG  tablet Take 1 tablet (200 mg total) by mouth 2 (two) times daily.   clopidogrel (PLAVIX) 75 MG tablet Take 75 mg by mouth daily.   diclofenac Sodium (VOLTAREN) 1 % GEL Apply 2 g topically 4 (four) times daily.   DULoxetine (CYMBALTA) 30 MG capsule Take 30 mg by mouth daily as needed (sleep).   feeding supplement (ENSURE ENLIVE / ENSURE PLUS) LIQD Take 237 mLs by mouth 3 (three) times daily between meals.   ferrous sulfate 325 (65 FE) MG tablet Take 1 tablet (325 mg total) by mouth daily with breakfast for 100 doses.   fluticasone-salmeterol (ADVAIR HFA) 45-21 MCG/ACT inhaler Inhale 2 puffs into the lungs 2 (two) times daily.   folic acid (FOLVITE) 1 MG tablet Take 1 tablet (1 mg total) by mouth daily.   gabapentin (NEURONTIN) 100 MG capsule Take 1 capsule (100 mg total) by mouth See admin instructions. Daily or twice daily if tolerated. (Patient taking differently: Take 100-200 mg by mouth daily.)   lidocaine-prilocaine (EMLA) cream Apply 1 Application topically as directed. Apply to port site 2 hours prior to stick and cover with Press-and-Seal to numb port before access   methotrexate (RHEUMATREX) 2.5 MG tablet Take 3 tablets (7.5 mg total) by mouth once a week. Caution:Chemotherapy. Protect from light.   ondansetron (ZOFRAN) 8 MG tablet Start taking 72  hours after chemotherapy treatment.   Oxycodone HCl 10 MG TABS Take 10 mg by mouth every 6 (six) hours.   potassium chloride (KLOR-CON M10) 10 MEQ tablet TAKE 2 TABLETS(20 MEQ) BY MOUTH DAILY   prochlorperazine (COMPAZINE) 10 MG tablet Take 1 tablet (10 mg total) by mouth every 6 (six) hours as needed.   [DISCONTINUED] meloxicam (MOBIC) 7.5 MG tablet Take 1 tablet (7.5 mg total) by mouth daily as needed for pain.   amLODipine (NORVASC) 10 MG tablet Take 1 tablet (10 mg total) by mouth daily.   apixaban (ELIQUIS) 5 MG TABS tablet Take 1 tablet (5 mg total) by mouth 2 (two) times daily.   atorvastatin (LIPITOR) 40 MG tablet Take 1 tablet (40 mg  total) by mouth daily at 6 PM.   isosorbide mononitrate (IMDUR) 30 MG 24 hr tablet Take 1 tablet (30 mg total) by mouth daily.   No facility-administered encounter medications on file as of 10/30/2022.    Allergies (verified) Dilaudid [hydromorphone hcl], Ramipril, and Tramadol   History: Past Medical History:  Diagnosis Date   Acute respiratory failure (HCC) 01/31/2022   Bilateral shoulder pain 08/20/2012   Bite wound of left hand 09/27/2021   CAD (coronary artery disease)    Occluded nondominent RCA managed medically - August 2023   Cardiac arrest [facility])    OOH VF - August 2023   Community acquired pneumonia 11/07/2011   Critical limb ischemia with history of revascularization of same extremity (HCC) 04/28/2020   DDD (degenerative disc disease), lumbosacral     and grade 2 slip   DVT (deep venous thrombosis) (HCC) 01/31/2022   Esophageal cancer (HCC)    Adenocarcinoma with metastasis   GERD (gastroesophageal reflux disease)    History of anemia    History of COVID-19 05/10/2019   History of critical lower limb ischemia 02/19/2019   Hypertension    Impingement syndrome of shoulder region    Ischemic ulcer of toe of left foot (HCC)    Liver mass    Low back pain without sciatica    Lupus (HCC)    Multiple fractures of ribs, bilateral, due to CPR trauma    Onychomycosis    Paronychia of great toe    Peripheral vascular disease (HCC)    Pressure injury of skin    Pulmonary embolus (HCC)    RA (rheumatoid arthritis) (HCC)    Rotator cuff tear 11/04/2014   Sternal fracture    Stroke [facility])    Past Surgical History:  Procedure Laterality Date   ABDOMINAL AORTAGRAM  02/20/2019   ABDOMINAL AORTOGRAM W/LOWER EXTREMITY N/A 02/20/2019   Procedure: ABDOMINAL AORTOGRAM W/LOWER EXTREMITY;  Surgeon: Cephus Shelling, MD;  [facility]: [facility] INVASIVE CV LAB;  Service: Cardiovascular;  Laterality: N/A;   ACHILLES TENDON SURGERY Bilateral    AMPUTATION Left 05/11/2020   Procedure: LEFT  ABOVE KNEE AMPUTATION;  Surgeon: Chuck Hint, MD;  Location[facility] OR;  Service: Vascular;  Laterality: Left;   ENDARTERECTOMY POPLITEAL Left 05/01/2020   Procedure: ENDARTERECTOMY POPLITEAL;  Surgeon: Maeola Harman, MD;  Location: [facility] OR;  Service: Vascular;  Laterality: Left;   FASCIOTOMY Left 05/01/2020   Procedure: FASCIOTOMY;  Surgeon: Maeola Harman, MD;  Location: [facility] OR;  Service: Vascular;  Laterality: Left;   FEMORAL-POPLITEAL BYPASS GRAFT Left 02/26/2019   Procedure: BYPASS GRAFT FEMORAL-POPLITEAL ARTERY LEFT LEG USING 6mm PROPATEN GRAFT;  Surgeon: Nada Libman, MD;  Location: [facility] OR;  Service: Vascular;  Laterality: Left;   INTRAOPERATIVE ARTERIOGRAM  Left 01/22/2020   Procedure: INTRA OPERATIVE ARTERIOGRAM with administration of thrombolyics in Left femoral to popliteal bypass.;  Surgeon: Cephus Shelling, MD;  Location: [facility] OR;  Service: Vascular;  Laterality: Left;   IR IMAGING GUIDED PORT INSERTION  03/07/2022   LEFT HEART CATH AND CORONARY ANGIOGRAPHY N/A 03/15/2017   Procedure: LEFT HEART CATH AND CORONARY ANGIOGRAPHY;  Surgeon: Yvonne Kendall, MD;  [facility];  Service: Cardiovascular;  Laterality: N/A;   LEFT HEART CATH AND CORONARY ANGIOGRAPHY N/A 02/06/2022   Procedure: LEFT HEART CATH AND CORONARY ANGIOGRAPHY;  Surgeon: Swaziland, Peter M, MD;  Location: [facility] INVASIVE CV LAB;  Service: Cardiovascular;  Laterality: N/A;   LOWER EXTREMITY INTERVENTION Left 04/29/2020   Procedure: LOWER EXTREMITY INTERVENTION- LYSIS;  Surgeon: Cephus Shelling, MD;  [facility];  Service: Cardiovascular;  Laterality: Left;   OSTEOTOMY AND ULNAR SHORTENING Right 07/22/2002   PATCH ANGIOPLASTY Left 05/01/2020   Procedure: PATCH ANGIOPLASTY USING HEMASHIELD PLATINUM FINESSE PATCH;  Surgeon: Maeola Harman, MD;  Location: [facility] OR;  Service: Vascular;  Laterality: Left;   PERIPHERAL VASCULAR ATHERECTOMY  01/23/2020   Procedure:  PERIPHERAL VASCULAR ATHERECTOMY;  Surgeon: Cephus Shelling, MD;  [facility];  Service: Cardiovascular;;   PERIPHERAL VASCULAR BALLOON ANGIOPLASTY  01/23/2020   Procedure: PERIPHERAL VASCULAR BALLOON ANGIOPLASTY;  Surgeon: Cephus Shelling, MD;  [facility];  Service: Cardiovascular;;   PERIPHERAL VASCULAR INTERVENTION Left 04/30/2020   Procedure: PERIPHERAL VASCULAR INTERVENTION;  Surgeon: Leonie Douglas, MD;  [facility];  Service: Cardiovascular;  Laterality: Left;   PERIPHERAL VASCULAR THROMBECTOMY N/A 01/23/2020   Procedure: lysis recheck;  Surgeon: Cephus Shelling, MD;  [facility];  Service: Cardiovascular;  Laterality: N/A;  + [place]umb[place]    PERIPHERAL VASCULAR THROMBECTOMY N/A 04/30/2020   Procedure: Lysis Recheck;  Surgeon: Leonie Douglas, MD;  [facility];  Service: Cardiovascular;  Laterality: N/A;   SHOULDER ARTHROSCOPY WITH BICEPSTENOTOMY Right 03/11/2015   Procedure: SHOULDER ARTHROSCOPY WITH BICEPSTENOTOMY;  Surgeon: Loreta Ave, MD;  Location: [facility];  Service: Orthopedics;  Laterality: Right;   SHOULDER ARTHROSCOPY WITH DISTAL CLAVICLE RESECTION Left 11/19/2014   Procedure: SHOULDER ARTHROSCOPY WITH DISTAL CLAVICLE RESECTION;  Surgeon: Mckinley Jewel, MD;  Location: [facility];  Service: Orthopedics;  Laterality: Left;   SHOULDER ARTHROSCOPY WITH ROTATOR CUFF REPAIR AND SUBACROMIAL DECOMPRESSION Left 11/19/2014   Procedure: LEFT SHOULDER ARTHROSCOPY, DEBRIDEMENT DISTAL CLAVICLE EXCISION, ACROMIOPLASTY WITH ROTATOR CUFF REPAIR ;  Surgeon: Mckinley Jewel, MD;  Location: [facility];  Service: Orthopedics;  Laterality: Left;   THROMBECTOMY OF BYPASS GRAFT FEMORAL- POPLITEAL ARTERY Left 05/01/2020   Procedure: THROMBECTOMY OF LOWER EXTREMITY;  Surgeon: Maeola Harman, MD;  Location: [facility] OR;  Service: Vascular;  Laterality: Left;    TRANSFORAMINAL LUMBAR INTERBODY FUSION (TLIF) WITH PEDICLE SCREW FIXATION 1 LEVEL N/A 01/03/2018   Procedure: TRANSFORAMINAL LUMBAR INTERBODY FUSION (TLIF) LUMBAR FIVE-SACRAL ONE;  Surgeon: Venita Lick, MD;  [facility];  Service: Orthopedics;  Laterality: N/A;   ULTRASOUND GUIDANCE FOR VASCULAR ACCESS Right 01/22/2020   Procedure: ULTRASOUND GUIDANCE FOR VASCULAR ACCESS Right Common Femoral Artery.;  Surgeon: Cephus Shelling, MD;  Location: [facility] OR;  Service: Vascular;  Laterality: Right;   VIDEO ASSISTED THORACOSCOPY (VATS)/DECORTICATION Right 11/21/2011   drainage of empyema   Family History  Problem Relation Age of Onset   Heart attack Father 32  Alcohol abuse Father    Aneurysm Mother    Stroke Neg Hx    Cancer Neg Hx    Social History   Socioeconomic History   Marital status: Single    Spouse name: Not on file   Number of children: 0   Years of education: Not on file   Highest education level: Not on file  Occupational History   Occupation: Disabled    Comment: 2/2 RA  Tobacco Use   Smoking status: Former    Packs/day: 0.25    Years: 20.00    Additional pack years: 0.00    Total pack years: 5.00    Types: Cigarettes    Quit date: 02/19/2019    Years since quitting: 3.6   Smokeless tobacco: Never   Tobacco comments:    Patient stated he smokes a couple day and is attempting to quit smoking  Vaping Use   Vaping Use: Never used  Substance and Sexual Activity   Alcohol use: No    Alcohol/week: 0.0 standard drinks of alcohol    Comment: sober 1998   Drug use: No   Sexual activity: Not on file  Other Topics Concern   Not on file  Social History Narrative   On disability since 1992, used to work with city of [place]. Quit drinking in 1990.      Current Social History 11/05/2019        Patient lives by himself most of the time. Sometimes girlfriend's son stays with him in a one level home. There are 3 steps with handrail up to the entrance the patient uses.        Patient's method of transportation is personal truck. This was recently vandalized and patient doesn't have funds to repair at this time.      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      Identified important Relationships are "My girlfriend, Selena Batten."       Pets : American Terrier Archivist), Lab (Roxy)       Interests / Fun: Walk, watch TV       Current Stressors: "Going through a lot the last 6-7 years; I worry too much about my health." (Discussed IBH, patient not interested at this time.)      Religious / Personal Beliefs: Baptist       L. [place]atte, BSN, RN-BC    Social Determinants of Health   Financial Resource Strain: Low Risk  (10/30/2022)   Overall Financial Resource Strain (CARDIA)    Difficulty of Paying Living Expenses: Not hard at all  Food Insecurity: No Food Insecurity (10/30/2022)   Hunger Vital Sign    Worried About Running Out of Food in the Last Year: Never true    Ran Out of Food in the Last Year: Never true  Transportation Needs: Unmet Transportation Needs (10/30/2022)   PRAPARE - Administrator, Civil Service (Medical): Yes    Lack of Transportation (Non-Medical): Yes  Physical Activity: Inactive (10/30/2022)   Exercise Vital Sign    Days of Exercise per Week: 0 days    Minutes of Exercise per Session: 0 min  Stress: No Stress Concern Present (10/30/2022)   [employer] of Occupational Health - Occupational Stress Questionnaire    Feeling of Stress : Not at all  Social Connections: Moderately Isolated (10/30/2022)   Social Connection and Isolation Panel [NHANES]    Frequency of Communication with Friends  and Family: Twice a week    Frequency of Social Gatherings with Friends and Family: More than three times a week    Attends Religious Services: Never    Database administrator or Organizations: No    Attends Engineer, structural: Never    Marital Status: Living with partner     Tobacco Counseling Counseling given: Not Answered Tobacco comments: Patient stated he smokes a couple day and is attempting to quit smoking   Clinical Intake:     Pain : No/denies pain Pain Score: 0-No pain        How often do you need to have someone help you when you read instructions, pamphlets, or other written materials from your doctor or pharmacy?: 1 - Never What is the last grade level you completed in school?: 10 GRADE  Diabetic?NO   Interpreter Needed?: No  Information entered by :: [facility] Zannie Locastro   Activities of Daily Living    10/30/2022   11:20 AM 10/30/2022   11:04 AM  In your present state of health, do you have any difficulty performing the following activities:  Hearing? 0 0  Vision? 0 0  Difficulty concentrating or making decisions? 0 0  Walking or climbing stairs? 1 1  Dressing or bathing? 1 1  Doing errands, shopping? 1 1  Preparing Food and eating ? Y   Using the Toilet? N   In the past six months, have you accidently leaked urine? N   Do you have problems with loss of bowel control? N   Managing your Medications? N   Managing your Finances? N   Housekeeping or managing your Housekeeping? Y     Patient Care Team: Rocky Morel, DO as PCP - General Marinus Maw, MD as PCP - Electrophysiology (Cardiology) Rollene Rotunda, MD as PCP - Cardiology (Cardiology) Ewing Schlein, MD as Referring Physician (Pain Medicine) Nada Libman, MD as Consulting Physician (Vascular Surgery) Sallye Lat, MD as Consulting Physician (Ophthalmology) Jerrell Mylar, Jacquelyne Balint, RN as Oncology Nurse Navigator Ladene Artist, MD as Consulting Physician (Oncology)  Indicate any recent Medical Services you may have received from other than [facility] providers in the past year (date may be approximate).     Assessment:   This is a routine wellness examination for Kamal.  Hearing/Vision screen No results found.  Dietary issues and exercise activities  discussed: Current Exercise Habits: The patient does not participate in regular exercise at present   Goals Addressed   None   Depression Screen    10/30/2022   11:15 AM 09/27/2022    2:53 PM 06/15/2022    3:06 PM 04/19/2022    9:34 AM 03/02/2022    1:17 PM 01/27/2021   11:28 AM 01/08/2020   10:56 AM  PHQ 2/9 Scores  PHQ - 2 Score 0 0 0 0 0 0 0  PHQ- 9 Score     9 3 13     Fall Risk    10/30/2022   11:20 AM 10/30/2022   11:04 AM 08/16/2022    2:18 PM 06/15/2022    1:49 PM 04/19/2022    9:28 AM  Fall Risk   Falls in the past year? 0 0 0 1 1  Number falls in past yr: 0 0 0 1 1  Injury with Fall? 0 0 0 0   Risk for fall due to : Impaired balance/gait Impaired balance/gait  No Fall Risks Other (Comment);History of fall(s);Impaired mobility  Risk for fall due to: Comment  left AKA  Follow up Falls evaluation completed;Falls prevention discussed Falls prevention discussed;Falls evaluation completed Falls evaluation completed Falls evaluation completed Falls evaluation completed    FALL RISK PREVENTION PERTAINING TO THE HOME:  Any stairs in or around the home? No  If so, are there any without handrails? No  Home free of loose throw rugs in walkways, pet beds, electrical cords, etc? Yes  Adequate lighting in your home to reduce risk of falls? Yes   ASSISTIVE DEVICES UTILIZED TO PREVENT FALLS:  Life alert? No  Use of a cane, walker or w/c? Yes  Grab bars in the bathroom? Yes  Shower chair or bench in shower? Yes  Elevated toilet seat or a handicapped toilet? Yes   TIMED UP AND GO:  Was the test performed? No .  Length of time to ambulate 10 feet: NA sec.     Cognitive Function:        10/30/2022   11:20 AM  6CIT Screen  What Year? 0 points  What month? 0 points  What time? 0 points  Count back from 20 0 points  Months in reverse 0 points  Repeat phrase 0 points  Total Score 0 points    Immunizations Immunization History  Administered Date(s) Administered    Influenza Split 04/08/2012, 03/28/2014   Influenza,inj,Quad PF,6+ Mos 06/01/2015, 02/25/2016, 04/30/2017, 03/06/2019, 05/29/2022   PNEUMOCOCCAL CONJUGATE-20 02/06/2022   Td 11/17/2009   Tdap 03/31/2017, 09/19/2021    TDAP status: Up to date     Flu Vaccine status: Up to date    Covid-19 vaccine status: Declined, Education has been provided regarding the importance of this vaccine but patient still declined. Advised may receive this vaccine at local pharmacy or Health Dept.or vaccine clinic. Aware to provide a copy of the vaccination record if obtained from local pharmacy or Health Dept. Verbalized acceptance and understanding.  Qualifies for Shingles Vaccine? Yes   Zostavax completed No   Shingrix Completed?: No.    Education has been provided regarding the importance of this vaccine. Patient has been advised to call insurance company to determine out of pocket expense if they have not yet received this vaccine. Advised may also receive vaccine at local pharmacy or Health Dept. Verbalized acceptance and understanding.  Screening Tests Health Maintenance  Topic Date Due   COVID-19 Vaccine (1) Never done   Zoster Vaccines- Shingrix (1 of 2) Never done   COLONOSCOPY (Pts 45-52yrs Insurance coverage will need to be confirmed)  04/19/2021   INFLUENZA VACCINE  01/25/2023   Medicare Annual Wellness (AWV)  10/30/2023   DTaP/Tdap/Td (4 - Td or Tdap) 09/20/2031   Hepatitis C Screening  Completed   HIV Screening  Completed   HPV VACCINES  Aged Out    Health Maintenance  Health Maintenance Due  Topic Date Due   COVID-19 Vaccine (1) Never done   Zoster Vaccines- Shingrix (1 of 2) Never done   COLONOSCOPY (Pts 45-70yrs Insurance coverage will need to be confirmed)  04/19/2021   Colorectal cancer screening: DECLINED IN 2012   Lung Cancer Screening: (Low Dose CT Chest recommended if Age 65-80 years, 30 pack-year currently smoking OR have quit w/in 15years.) does qualify.   Lung Cancer  Screening Referral: DEFERRED TO PCP   Additional Screening:  Hepatitis C Screening: does qualify; Completed 02/11/2022  Vision Screening: Recommended annual ophthalmology exams for early detection of glaucoma and other disorders of the eye. Is the patient up to date with their annual eye exam?  No  Who is the provider or what is the name of the office in which the patient attends annual eye exams? [facility]  If pt is not established with a provider, would they like to be referred to a provider to establish care? No .   Dental Screening: Recommended annual dental exams for proper oral hygiene  Community Resource Referral / Chronic Care Management: CRR required this visit?  No   CCM required this visit?  No      Plan:     I have personally reviewed and noted the following in the patient's chart:   Medical and social history Use of alcohol, tobacco or illicit drugs  Current medications and supplements including opioid prescriptions. Patient is currently taking opioid prescriptions. Information provided to patient regarding non-opioid alternatives. Patient advised to discuss non-opioid treatment plan with their provider. Functional ability and status Nutritional status Physical activity Advanced directives List of other physicians Hospitalizations, surgeries, and ER visits in previous 12 months Vitals Screenings to include cognitive, depression, and falls Referrals and appointments  In addition, I have reviewed and discussed with patient certain preventive protocols, quality metrics, and best practice recommendations. A written personalized care plan for preventive services as well as general preventive health recommendations were provided to patient.     Derrell Lolling, CMA   10/30/2022   Nurse Notes: IN PERSON [facility]    Mr. Moudry , Thank you for taking time to come for your Medicare Wellness Visit. I appreciate your ongoing commitment to your health goals. Please  review the following plan we discussed and let me know if I can assist you in the future.   These are the goals we discussed:  Goals       Quit smoking / using tobacco      Walk daily 30-60 minutes each time (pt-stated)        This is a list of the screening recommended for you and due dates:  Health Maintenance  Topic Date Due   COVID-19 Vaccine (1) Never done   Zoster (Shingles) Vaccine (1 of 2) Never done   Colon Cancer Screening  04/19/2021   Flu Shot  01/25/2023   Medicare Annual Wellness Visit  10/30/2023   DTaP/Tdap/Td vaccine (4 - Td or Tdap) 09/20/2031   Hepatitis C Screening: USPSTF Recommendation to screen - Ages 29-79 yo.  Completed   HIV Screening  Completed   HPV Vaccine  Aged Out

## 2022-10-31 ENCOUNTER — Ambulatory Visit: Payer: 59 | Admitting: Gastroenterology

## 2022-10-31 DIAGNOSIS — Z7901 Long term (current) use of anticoagulants: Secondary | ICD-10-CM | POA: Diagnosis not present

## 2022-10-31 DIAGNOSIS — R21 Rash and other nonspecific skin eruption: Secondary | ICD-10-CM | POA: Diagnosis not present

## 2022-10-31 DIAGNOSIS — Z86711 Personal history of pulmonary embolism: Secondary | ICD-10-CM | POA: Diagnosis not present

## 2022-10-31 DIAGNOSIS — T451X5A Adverse effect of antineoplastic and immunosuppressive drugs, initial encounter: Secondary | ICD-10-CM | POA: Diagnosis not present

## 2022-10-31 DIAGNOSIS — L738 Other specified follicular disorders: Secondary | ICD-10-CM | POA: Diagnosis not present

## 2022-10-31 DIAGNOSIS — I10 Essential (primary) hypertension: Secondary | ICD-10-CM | POA: Diagnosis not present

## 2022-10-31 DIAGNOSIS — C159 Malignant neoplasm of esophagus, unspecified: Secondary | ICD-10-CM | POA: Diagnosis not present

## 2022-10-31 DIAGNOSIS — I2510 Atherosclerotic heart disease of native coronary artery without angina pectoris: Secondary | ICD-10-CM | POA: Diagnosis not present

## 2022-10-31 DIAGNOSIS — E785 Hyperlipidemia, unspecified: Secondary | ICD-10-CM | POA: Diagnosis not present

## 2022-10-31 DIAGNOSIS — Z86718 Personal history of other venous thrombosis and embolism: Secondary | ICD-10-CM | POA: Diagnosis not present

## 2022-11-01 ENCOUNTER — Inpatient Hospital Stay: Payer: 59

## 2022-11-01 ENCOUNTER — Inpatient Hospital Stay: Payer: 59 | Admitting: Nutrition

## 2022-11-01 ENCOUNTER — Inpatient Hospital Stay: Payer: 59 | Admitting: Nurse Practitioner

## 2022-11-01 DIAGNOSIS — C159 Malignant neoplasm of esophagus, unspecified: Secondary | ICD-10-CM | POA: Diagnosis not present

## 2022-11-01 DIAGNOSIS — Z7901 Long term (current) use of anticoagulants: Secondary | ICD-10-CM | POA: Diagnosis not present

## 2022-11-01 DIAGNOSIS — E785 Hyperlipidemia, unspecified: Secondary | ICD-10-CM | POA: Diagnosis not present

## 2022-11-01 DIAGNOSIS — L738 Other specified follicular disorders: Secondary | ICD-10-CM | POA: Diagnosis not present

## 2022-11-01 DIAGNOSIS — I2510 Atherosclerotic heart disease of native coronary artery without angina pectoris: Secondary | ICD-10-CM | POA: Diagnosis not present

## 2022-11-01 DIAGNOSIS — T451X5A Adverse effect of antineoplastic and immunosuppressive drugs, initial encounter: Secondary | ICD-10-CM | POA: Diagnosis not present

## 2022-11-01 DIAGNOSIS — Z86718 Personal history of other venous thrombosis and embolism: Secondary | ICD-10-CM | POA: Diagnosis not present

## 2022-11-01 DIAGNOSIS — R21 Rash and other nonspecific skin eruption: Secondary | ICD-10-CM | POA: Diagnosis not present

## 2022-11-01 DIAGNOSIS — I10 Essential (primary) hypertension: Secondary | ICD-10-CM | POA: Diagnosis not present

## 2022-11-01 DIAGNOSIS — Z86711 Personal history of pulmonary embolism: Secondary | ICD-10-CM | POA: Diagnosis not present

## 2022-11-02 ENCOUNTER — Other Ambulatory Visit: Payer: Self-pay

## 2022-11-02 ENCOUNTER — Encounter: Payer: Self-pay | Admitting: Physician Assistant

## 2022-11-02 DIAGNOSIS — T451X5A Adverse effect of antineoplastic and immunosuppressive drugs, initial encounter: Secondary | ICD-10-CM | POA: Diagnosis not present

## 2022-11-02 DIAGNOSIS — I2699 Other pulmonary embolism without acute cor pulmonale: Secondary | ICD-10-CM

## 2022-11-02 DIAGNOSIS — C159 Malignant neoplasm of esophagus, unspecified: Secondary | ICD-10-CM | POA: Diagnosis not present

## 2022-11-02 DIAGNOSIS — Z86711 Personal history of pulmonary embolism: Secondary | ICD-10-CM | POA: Diagnosis not present

## 2022-11-02 DIAGNOSIS — I2510 Atherosclerotic heart disease of native coronary artery without angina pectoris: Secondary | ICD-10-CM | POA: Diagnosis not present

## 2022-11-02 DIAGNOSIS — I10 Essential (primary) hypertension: Secondary | ICD-10-CM | POA: Diagnosis not present

## 2022-11-02 DIAGNOSIS — R21 Rash and other nonspecific skin eruption: Secondary | ICD-10-CM | POA: Diagnosis not present

## 2022-11-02 DIAGNOSIS — E785 Hyperlipidemia, unspecified: Secondary | ICD-10-CM | POA: Diagnosis not present

## 2022-11-02 DIAGNOSIS — L738 Other specified follicular disorders: Secondary | ICD-10-CM | POA: Diagnosis not present

## 2022-11-02 DIAGNOSIS — Z86718 Personal history of other venous thrombosis and embolism: Secondary | ICD-10-CM | POA: Diagnosis not present

## 2022-11-02 DIAGNOSIS — Z7901 Long term (current) use of anticoagulants: Secondary | ICD-10-CM | POA: Diagnosis not present

## 2022-11-03 DIAGNOSIS — L513 Stevens-Johnson syndrome-toxic epidermal necrolysis overlap syndrome: Secondary | ICD-10-CM | POA: Diagnosis not present

## 2022-11-03 DIAGNOSIS — M0579 Rheumatoid arthritis with rheumatoid factor of multiple sites without organ or systems involvement: Secondary | ICD-10-CM | POA: Diagnosis not present

## 2022-11-03 DIAGNOSIS — R21 Rash and other nonspecific skin eruption: Secondary | ICD-10-CM | POA: Diagnosis not present

## 2022-11-03 MED ORDER — APIXABAN 5 MG PO TABS
5.0000 mg | ORAL_TABLET | Freq: Two times a day (BID) | ORAL | 0 refills | Status: DC
Start: 2022-11-03 — End: 2022-11-30

## 2022-11-03 MED ORDER — ISOSORBIDE MONONITRATE ER 30 MG PO TB24: 30.0000 mg | ORAL_TABLET | Freq: Every day | ORAL | 0 refills | Status: DC

## 2022-11-07 DIAGNOSIS — Z89612 Acquired absence of left leg above knee: Secondary | ICD-10-CM | POA: Diagnosis not present

## 2022-11-07 DIAGNOSIS — Z7902 Long term (current) use of antithrombotics/antiplatelets: Secondary | ICD-10-CM | POA: Diagnosis not present

## 2022-11-07 DIAGNOSIS — C771 Secondary and unspecified malignant neoplasm of intrathoracic lymph nodes: Secondary | ICD-10-CM | POA: Diagnosis not present

## 2022-11-07 DIAGNOSIS — Z791 Long term (current) use of non-steroidal anti-inflammatories (NSAID): Secondary | ICD-10-CM | POA: Diagnosis not present

## 2022-11-07 DIAGNOSIS — I10 Essential (primary) hypertension: Secondary | ICD-10-CM | POA: Diagnosis not present

## 2022-11-07 DIAGNOSIS — J449 Chronic obstructive pulmonary disease, unspecified: Secondary | ICD-10-CM | POA: Diagnosis not present

## 2022-11-07 DIAGNOSIS — J439 Emphysema, unspecified: Secondary | ICD-10-CM | POA: Diagnosis not present

## 2022-11-07 DIAGNOSIS — Z86718 Personal history of other venous thrombosis and embolism: Secondary | ICD-10-CM | POA: Diagnosis not present

## 2022-11-07 DIAGNOSIS — I472 Ventricular tachycardia, unspecified: Secondary | ICD-10-CM | POA: Diagnosis not present

## 2022-11-07 DIAGNOSIS — J9601 Acute respiratory failure with hypoxia: Secondary | ICD-10-CM | POA: Diagnosis not present

## 2022-11-07 DIAGNOSIS — Z993 Dependence on wheelchair: Secondary | ICD-10-CM | POA: Diagnosis not present

## 2022-11-07 DIAGNOSIS — Z79891 Long term (current) use of opiate analgesic: Secondary | ICD-10-CM | POA: Diagnosis not present

## 2022-11-07 DIAGNOSIS — M069 Rheumatoid arthritis, unspecified: Secondary | ICD-10-CM | POA: Diagnosis not present

## 2022-11-07 DIAGNOSIS — I739 Peripheral vascular disease, unspecified: Secondary | ICD-10-CM | POA: Diagnosis not present

## 2022-11-07 DIAGNOSIS — I2510 Atherosclerotic heart disease of native coronary artery without angina pectoris: Secondary | ICD-10-CM | POA: Diagnosis not present

## 2022-11-07 DIAGNOSIS — D630 Anemia in neoplastic disease: Secondary | ICD-10-CM | POA: Diagnosis not present

## 2022-11-07 DIAGNOSIS — K219 Gastro-esophageal reflux disease without esophagitis: Secondary | ICD-10-CM | POA: Diagnosis not present

## 2022-11-07 DIAGNOSIS — M5137 Other intervertebral disc degeneration, lumbosacral region: Secondary | ICD-10-CM | POA: Diagnosis not present

## 2022-11-07 DIAGNOSIS — Z8616 Personal history of COVID-19: Secondary | ICD-10-CM | POA: Diagnosis not present

## 2022-11-07 DIAGNOSIS — E43 Unspecified severe protein-calorie malnutrition: Secondary | ICD-10-CM | POA: Diagnosis not present

## 2022-11-07 DIAGNOSIS — Z8673 Personal history of transient ischemic attack (TIA), and cerebral infarction without residual deficits: Secondary | ICD-10-CM | POA: Diagnosis not present

## 2022-11-07 DIAGNOSIS — I252 Old myocardial infarction: Secondary | ICD-10-CM | POA: Diagnosis not present

## 2022-11-07 DIAGNOSIS — Z9181 History of falling: Secondary | ICD-10-CM | POA: Diagnosis not present

## 2022-11-08 DIAGNOSIS — M24541 Contracture, right hand: Secondary | ICD-10-CM | POA: Diagnosis not present

## 2022-11-08 DIAGNOSIS — M24542 Contracture, left hand: Secondary | ICD-10-CM | POA: Diagnosis not present

## 2022-11-09 ENCOUNTER — Ambulatory Visit (INDEPENDENT_AMBULATORY_CARE_PROVIDER_SITE_OTHER): Payer: 59 | Admitting: Student

## 2022-11-09 ENCOUNTER — Encounter: Payer: Self-pay | Admitting: Student

## 2022-11-09 ENCOUNTER — Other Ambulatory Visit: Payer: Self-pay

## 2022-11-09 VITALS — BP 96/69 | HR 88 | Temp 98.3°F | Ht 68.0 in

## 2022-11-09 DIAGNOSIS — Z8673 Personal history of transient ischemic attack (TIA), and cerebral infarction without residual deficits: Secondary | ICD-10-CM | POA: Diagnosis not present

## 2022-11-09 DIAGNOSIS — Z79891 Long term (current) use of opiate analgesic: Secondary | ICD-10-CM | POA: Diagnosis not present

## 2022-11-09 DIAGNOSIS — I10 Essential (primary) hypertension: Secondary | ICD-10-CM | POA: Diagnosis not present

## 2022-11-09 DIAGNOSIS — Z9181 History of falling: Secondary | ICD-10-CM | POA: Diagnosis not present

## 2022-11-09 DIAGNOSIS — Z791 Long term (current) use of non-steroidal anti-inflammatories (NSAID): Secondary | ICD-10-CM | POA: Diagnosis not present

## 2022-11-09 DIAGNOSIS — J439 Emphysema, unspecified: Secondary | ICD-10-CM | POA: Diagnosis not present

## 2022-11-09 DIAGNOSIS — Z8616 Personal history of COVID-19: Secondary | ICD-10-CM | POA: Diagnosis not present

## 2022-11-09 DIAGNOSIS — G894 Chronic pain syndrome: Secondary | ICD-10-CM | POA: Diagnosis not present

## 2022-11-09 DIAGNOSIS — E43 Unspecified severe protein-calorie malnutrition: Secondary | ICD-10-CM | POA: Diagnosis not present

## 2022-11-09 DIAGNOSIS — R21 Rash and other nonspecific skin eruption: Secondary | ICD-10-CM

## 2022-11-09 DIAGNOSIS — K219 Gastro-esophageal reflux disease without esophagitis: Secondary | ICD-10-CM | POA: Diagnosis not present

## 2022-11-09 DIAGNOSIS — J449 Chronic obstructive pulmonary disease, unspecified: Secondary | ICD-10-CM | POA: Diagnosis not present

## 2022-11-09 DIAGNOSIS — I739 Peripheral vascular disease, unspecified: Secondary | ICD-10-CM | POA: Diagnosis not present

## 2022-11-09 DIAGNOSIS — Z86718 Personal history of other venous thrombosis and embolism: Secondary | ICD-10-CM | POA: Diagnosis not present

## 2022-11-09 DIAGNOSIS — I252 Old myocardial infarction: Secondary | ICD-10-CM | POA: Diagnosis not present

## 2022-11-09 DIAGNOSIS — M069 Rheumatoid arthritis, unspecified: Secondary | ICD-10-CM | POA: Diagnosis not present

## 2022-11-09 DIAGNOSIS — D630 Anemia in neoplastic disease: Secondary | ICD-10-CM | POA: Diagnosis not present

## 2022-11-09 DIAGNOSIS — C771 Secondary and unspecified malignant neoplasm of intrathoracic lymph nodes: Secondary | ICD-10-CM | POA: Diagnosis not present

## 2022-11-09 DIAGNOSIS — Z993 Dependence on wheelchair: Secondary | ICD-10-CM | POA: Diagnosis not present

## 2022-11-09 DIAGNOSIS — M5137 Other intervertebral disc degeneration, lumbosacral region: Secondary | ICD-10-CM | POA: Diagnosis not present

## 2022-11-09 DIAGNOSIS — I2510 Atherosclerotic heart disease of native coronary artery without angina pectoris: Secondary | ICD-10-CM | POA: Diagnosis not present

## 2022-11-09 DIAGNOSIS — I472 Ventricular tachycardia, unspecified: Secondary | ICD-10-CM | POA: Diagnosis not present

## 2022-11-09 DIAGNOSIS — Z7902 Long term (current) use of antithrombotics/antiplatelets: Secondary | ICD-10-CM | POA: Diagnosis not present

## 2022-11-09 DIAGNOSIS — Z89612 Acquired absence of left leg above knee: Secondary | ICD-10-CM | POA: Diagnosis not present

## 2022-11-09 DIAGNOSIS — J9601 Acute respiratory failure with hypoxia: Secondary | ICD-10-CM | POA: Diagnosis not present

## 2022-11-09 MED ORDER — GABAPENTIN 300 MG PO CAPS: 300.0000 mg | ORAL_CAPSULE | Freq: Two times a day (BID) | ORAL | 3 refills | Status: DC

## 2022-11-09 NOTE — Patient Instructions (Signed)
  Thank you, [name], for allowing [place] to provide your care today. Today we discussed . . .  > Pain       -We can increase your gabapentin to 300 mg twice daily.  If you are getting very sleepy during the day on this medication I would recommend taking the lower 100 mg dose in the morning and then 300 mg at night.  Please continue to follow-up with your pain management doctor as well and let her know about your response to your pain medications. > Rash       -Your rash looks a lot better glad that you got good treatment at the hospital.  If you have any recurrence please not hesitate to call our office or go to the hospital for evaluation.  I have ordered the following labs for you:  Lab Orders  No laboratory test(s) ordered today     Referrals ordered today:   Referral Orders  No referral(s) requested today      I have ordered the following medication/changed the following medications:   Stop the following medications: There are no discontinued medications.   Start the following medications: No orders of the defined types were placed in this encounter.     Follow up: 3 months    Remember:     Should you have any questions or concerns please call the internal medicine clinic at [phone].     Rocky Morel, DO [facility]

## 2022-11-09 NOTE — Progress Notes (Unsigned)
   CC: Hospital follow-up for diffuse skin rash  HPI:  [name] [name] is a 62 y.o. male with PMH as below who presents to the clinic for hospital follow-up after being admitted from the clinic for concerns of SJS.  Please see assessment and plan for further details.  Past Medical History:  Diagnosis Date   Acute respiratory failure (HCC) 01/31/2022   Bilateral shoulder pain 08/20/2012   Bite wound of left hand 09/27/2021   CAD (coronary artery disease)    Occluded nondominent RCA managed medically - August 2023   Cardiac arrest [facility])    [facility] VF - August 2023   Community acquired pneumonia 11/07/2011   Critical limb ischemia with history of revascularization of same extremity (HCC) 04/28/2020   DDD (degenerative disc disease), lumbosacral     and grade 2 slip   DVT (deep venous thrombosis) (HCC) 01/31/2022   Esophageal cancer (HCC)    Adenocarcinoma with metastasis   GERD (gastroesophageal reflux disease)    History of anemia    History of COVID-19 05/10/2019   History of critical lower limb ischemia 02/19/2019   Hypertension    Impingement syndrome of shoulder region    Ischemic ulcer of toe of left foot (HCC)    Liver mass    Low back pain without sciatica    Lupus (HCC)    Multiple fractures of ribs, bilateral, due to CPR trauma    Onychomycosis    Paronychia of great toe    Peripheral vascular disease (HCC)    Pressure injury of skin    Pulmonary embolus (HCC)    RA (rheumatoid arthritis) (HCC)    Rotator cuff tear 11/04/2014   Sternal fracture    Stroke [facility])    Review of Systems:   Pertinent items noted in HPI and/or A&P.  Physical Exam:  Vitals:   11/09/22 1536  BP: 96/69  Pulse: 88  Temp: 98.3 F (36.8 C)  TempSrc: Oral  SpO2: 94%  Height: 5\' 8"  (1.727 m)    Constitutional: Tired appearing and disheveled elderly male in a wheelchair. In no acute distress. Pulm: Normal work of breathing on room air. Skin:Warm and dry.  No rash status previously seen  on the face, back, and buttocks. Neuro:Alert and oriented x3. No focal deficit noted. Psych:Pleasant mood and affect.   Assessment & Plan:   Rash Presents today for follow-up after hospitalization from 10/30/2022 - 11/02/2022.  Was seen in clinic on 10/30/2022 with a rash that was concerning for SJS.  He was admitted at [facility] and was evaluated by dermatology.  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  Discharge summary indicates that he was set to be treated with topical clindamycin but patient and family state that he was given some pills.  The rash is drastically improved and essentially resolved today.  Chronic pain syndrome Followed [facility] and has an appointment next week.  He has previously gotten benefit from gabapentin and is currently on 100 mg twice daily.  We will increase his gabapentin but would leave any further changes up to his pain management team. - Increase gabapentin to 300 mg twice daily, counseled on side effects    Patient discussed with Dr. Duwayne Heck, DO [facility] Resident PGY-1 Pager: [phone]

## 2022-11-10 ENCOUNTER — Ambulatory Visit (INDEPENDENT_AMBULATORY_CARE_PROVIDER_SITE_OTHER): Payer: 59

## 2022-11-10 DIAGNOSIS — Z0000 Encounter for general adult medical examination without abnormal findings: Secondary | ICD-10-CM | POA: Diagnosis not present

## 2022-11-10 NOTE — Progress Notes (Signed)
Subjective:   [name] [name] is a 62 y.o. male who presents for Medicare Annual/Subsequent preventive examination. I connected with  [name] [name] on 11/10/22 by a audio enabled telemedicine application and verified that I am speaking with the correct person using two identifiers.  Patient Location: Home  Provider Location: Office/Clinic  I discussed the limitations of evaluation and management by telemedicine. The patient expressed understanding and agreed to proceed.   Review of Systems    DEFERRED TO PCP  Cardiac Risk Factors include: advanced age (>37men, >96 women);dyslipidemia;hypertension;male gender;smoking/ tobacco exposure     Objective:    Today's Vitals   11/10/22 1040  PainSc: 8    There is no height or weight on file to calculate BMI.     11/10/2022   10:44 AM 10/30/2022   11:20 AM 10/30/2022   11:05 AM 10/19/2022    8:45 AM 10/04/2022    9:48 AM 09/27/2022    2:54 PM 09/20/2022    9:39 AM  Advanced Directives  Does Patient Have a Medical Advance Directive? Yes Yes Yes No Yes Yes Yes  Type of Social research officer, government Power of Attorney  Living will;Healthcare Power of [employer] Power of [place];Living will   Does patient want to make changes to medical advance directive?    No - Patient declined No - Patient declined No - Patient declined No - Patient declined  Copy of Healthcare Power of Attorney in Chart?      Yes - validated most recent copy scanned in chart (See row information)   Would patient like information on creating a medical advance directive?     No - Patient declined No - Patient declined     Current Medications (verified) Outpatient Encounter Medications as of 11/10/2022  Medication Sig   albuterol (VENTOLIN HFA) 108 (90 Base) MCG/ACT inhaler Inhale 2 puffs into the lungs every 6 (six) hours as needed for wheezing or shortness of breath.   amiodarone (PACERONE) 200 MG tablet Take 1  tablet (200 mg total) by mouth 2 (two) times daily.   apixaban (ELIQUIS) 5 MG TABS tablet Take 1 tablet (5 mg total) by mouth 2 (two) times daily.   clopidogrel (PLAVIX) 75 MG tablet Take 75 mg by mouth daily.   diclofenac Sodium (VOLTAREN) 1 % GEL Apply 2 g topically 4 (four) times daily.   DULoxetine (CYMBALTA) 30 MG capsule Take 30 mg by mouth daily as needed (sleep).   feeding supplement (ENSURE ENLIVE / ENSURE PLUS) LIQD Take 237 mLs by mouth 3 (three) times daily between meals.   ferrous sulfate 325 (65 FE) MG tablet Take 1 tablet (325 mg total) by mouth daily with breakfast for 100 doses.   fluticasone-salmeterol (ADVAIR HFA) 45-21 MCG/ACT inhaler Inhale 2 puffs into the lungs 2 (two) times daily.   folic acid (FOLVITE) 1 MG tablet Take 1 tablet (1 mg total) by mouth daily.   gabapentin (NEURONTIN) 300 MG capsule Take 1 capsule (300 mg total) by mouth 2 (two) times daily. Daily or twice daily if tolerated.   isosorbide mononitrate (IMDUR) 30 MG 24 hr tablet Take 1 tablet (30 mg total) by mouth daily.   lidocaine-prilocaine (EMLA) cream Apply 1 Application topically as directed. Apply to port site 2 hours prior to stick and cover with Press-and-Seal to numb port before access   meloxicam (MOBIC) 7.5 MG tablet Take 1 tablet (7.5 mg total) by mouth daily as needed for pain.  methotrexate (RHEUMATREX) 2.5 MG tablet Take 3 tablets (7.5 mg total) by mouth once a week. Caution:Chemotherapy. Protect from light.   ondansetron (ZOFRAN) 8 MG tablet Start taking 72 hours after chemotherapy treatment.   Oxycodone HCl 10 MG TABS Take 10 mg by mouth every 6 (six) hours.   potassium chloride (KLOR-CON M10) 10 MEQ tablet TAKE 2 TABLETS(20 MEQ) BY MOUTH DAILY   prochlorperazine (COMPAZINE) 10 MG tablet Take 1 tablet (10 mg total) by mouth every 6 (six) hours as needed.   amLODipine (NORVASC) 10 MG tablet Take 1 tablet (10 mg total) by mouth daily.   atorvastatin (LIPITOR) 40 MG tablet Take 1 tablet (40 mg  total) by mouth daily at 6 PM.   No facility-administered encounter medications on file as of 11/10/2022.    Allergies (verified) Dilaudid [hydromorphone hcl], Ramipril, and Tramadol   History: Past Medical History:  Diagnosis Date   Acute respiratory failure (HCC) 01/31/2022   Bilateral shoulder pain 08/20/2012   Bite wound of left hand 09/27/2021   CAD (coronary artery disease)    Occluded nondominent RCA managed medically - August 2023   Cardiac arrest [facility])    OOH VF - August 2023   Community acquired pneumonia 11/07/2011   Critical limb ischemia with history of revascularization of same extremity (HCC) 04/28/2020   DDD (degenerative disc disease), lumbosacral     and grade 2 slip   DVT (deep venous thrombosis) (HCC) 01/31/2022   Esophageal cancer (HCC)    Adenocarcinoma with metastasis   GERD (gastroesophageal reflux disease)    History of anemia    History of COVID-19 05/10/2019   History of critical lower limb ischemia 02/19/2019   Hypertension    Impingement syndrome of shoulder region    Ischemic ulcer of toe of left foot (HCC)    Liver mass    Low back pain without sciatica    Lupus (HCC)    Multiple fractures of ribs, bilateral, due to CPR trauma    Onychomycosis    Paronychia of great toe    Peripheral vascular disease (HCC)    Pressure injury of skin    Pulmonary embolus (HCC)    RA (rheumatoid arthritis) (HCC)    Rotator cuff tear 11/04/2014   Sternal fracture    Stroke [facility])    Past Surgical History:  Procedure Laterality Date   ABDOMINAL AORTAGRAM  02/20/2019   ABDOMINAL AORTOGRAM W/LOWER EXTREMITY N/A 02/20/2019   Procedure: ABDOMINAL AORTOGRAM W/LOWER EXTREMITY;  Surgeon: Cephus Shelling, MD;  [facility]: [facility] INVASIVE CV LAB;  Service: Cardiovascular;  Laterality: N/A;   ACHILLES TENDON SURGERY Bilateral    AMPUTATION Left 05/11/2020   Procedure: LEFT ABOVE KNEE AMPUTATION;  Surgeon: Chuck Hint, MD;  Location: [facility] OR;  Service:  Vascular;  Laterality: Left;   ENDARTERECTOMY POPLITEAL Left 05/01/2020   Procedure: ENDARTERECTOMY POPLITEAL;  Surgeon: Maeola Harman, MD;  Location: [facility] OR;  Service: Vascular;  Laterality: Left;   FASCIOTOMY Left 05/01/2020   Procedure: FASCIOTOMY;  Surgeon: Maeola Harman, MD;  Location: [facility] OR;  Service: Vascular;  Laterality: Left;   FEMORAL-POPLITEAL BYPASS GRAFT Left 02/26/2019   Procedure: BYPASS GRAFT FEMORAL-POPLITEAL ARTERY LEFT LEG USING 6mm PROPATEN GRAFT;  Surgeon: Nada Libman, MD;  Location: [facility] OR;  Service: Vascular;  Laterality: Left;   INTRAOPERATIVE ARTERIOGRAM Left 01/22/2020   Procedure: INTRA OPERATIVE ARTERIOGRAM with administration of thrombolyics in Left femoral to popliteal bypass.;  Surgeon: Cephus Shelling, MD;  Location: [facility] OR;  Service:  Vascular;  Laterality: Left;   IR IMAGING GUIDED PORT INSERTION  03/07/2022   LEFT HEART CATH AND CORONARY ANGIOGRAPHY N/A 03/15/2017   Procedure: LEFT HEART CATH AND CORONARY ANGIOGRAPHY;  Surgeon: Yvonne Kendall, MD;  [facility];  Service: Cardiovascular;  Laterality: N/A;   LEFT HEART CATH AND CORONARY ANGIOGRAPHY N/A 02/06/2022   Procedure: LEFT HEART CATH AND CORONARY ANGIOGRAPHY;  Surgeon: Swaziland, Peter M, MD;  Location: [facility] INVASIVE CV LAB;  Service: Cardiovascular;  Laterality: N/A;   LOWER EXTREMITY INTERVENTION Left 04/29/2020   Procedure: LOWER EXTREMITY INTERVENTION- LYSIS;  Surgeon: Cephus Shelling, MD;  [facility];  Service: Cardiovascular;  Laterality: Left;   OSTEOTOMY AND ULNAR SHORTENING Right 07/22/2002   PATCH ANGIOPLASTY Left 05/01/2020   Procedure: PATCH ANGIOPLASTY USING HEMASHIELD PLATINUM FINESSE PATCH;  Surgeon: Maeola Harman, MD;  Location: [facility] OR;  Service: Vascular;  Laterality: Left;   PERIPHERAL VASCULAR ATHERECTOMY  01/23/2020   Procedure: PERIPHERAL VASCULAR ATHERECTOMY;  Surgeon: Cephus Shelling, MD;  [facility];  Service: Cardiovascular;;   PERIPHERAL VASCULAR BALLOON ANGIOPLASTY  01/23/2020   Procedure: PERIPHERAL VASCULAR BALLOON ANGIOPLASTY;  Surgeon: Cephus Shelling, MD;  [facility];  Service: Cardiovascular;;   PERIPHERAL VASCULAR INTERVENTION Left 04/30/2020   Procedure: PERIPHERAL VASCULAR INTERVENTION;  Surgeon: Leonie Douglas, MD;  [facility];  Service: Cardiovascular;  Laterality: Left;   PERIPHERAL VASCULAR THROMBECTOMY N/A 01/23/2020   Procedure: lysis recheck;  Surgeon: Cephus Shelling, MD;  [facility];  Service: Cardiovascular;  Laterality: N/A;  + [place]umb[place]    PERIPHERAL VASCULAR THROMBECTOMY N/A 04/30/2020   Procedure: Lysis Recheck;  Surgeon: Leonie Douglas, MD;  [facility];  Service: Cardiovascular;  Laterality: N/A;   SHOULDER ARTHROSCOPY WITH BICEPSTENOTOMY Right 03/11/2015   Procedure: SHOULDER ARTHROSCOPY WITH BICEPSTENOTOMY;  Surgeon: Loreta Ave, MD;  Location: [facility];  Service: Orthopedics;  Laterality: Right;   SHOULDER ARTHROSCOPY WITH DISTAL CLAVICLE RESECTION Left 11/19/2014   Procedure: SHOULDER ARTHROSCOPY WITH DISTAL CLAVICLE RESECTION;  Surgeon: Mckinley Jewel, MD;  Location: [facility];  Service: Orthopedics;  Laterality: Left;   SHOULDER ARTHROSCOPY WITH ROTATOR CUFF REPAIR AND SUBACROMIAL DECOMPRESSION Left 11/19/2014   Procedure: LEFT SHOULDER ARTHROSCOPY, DEBRIDEMENT DISTAL CLAVICLE EXCISION, ACROMIOPLASTY WITH ROTATOR CUFF REPAIR ;  Surgeon: Mckinley Jewel, MD;  Location:  SURGERY CENTER;  Service: Orthopedics;  Laterality: Left;   THROMBECTOMY OF BYPASS GRAFT FEMORAL- POPLITEAL ARTERY Left 05/01/2020   Procedure: THROMBECTOMY OF LOWER EXTREMITY;  Surgeon: Maeola Harman, MD;  Location: [facility] OR;  Service: Vascular;  Laterality: Left;   TRANSFORAMINAL LUMBAR INTERBODY FUSION (TLIF) WITH PEDICLE SCREW FIXATION 1 LEVEL N/A 01/03/2018    Procedure: TRANSFORAMINAL LUMBAR INTERBODY FUSION (TLIF) LUMBAR FIVE-SACRAL ONE;  Surgeon: Venita Lick, MD;  [facility];  Service: Orthopedics;  Laterality: N/A;   ULTRASOUND GUIDANCE FOR VASCULAR ACCESS Right 01/22/2020   Procedure: ULTRASOUND GUIDANCE FOR VASCULAR ACCESS Right Common Femoral Artery.;  Surgeon: Cephus Shelling, MD;  Location: [facility] OR;  Service: Vascular;  Laterality: Right;   VIDEO ASSISTED THORACOSCOPY (VATS)/DECORTICATION Right 11/21/2011   drainage of empyema   Family History  Problem Relation Age of Onset   Heart attack Father 80   Alcohol abuse Father    Aneurysm Mother    Stroke Neg Hx    Cancer Neg Hx    Social History   Socioeconomic History  Marital status: Single    Spouse name: Not on file   Number of children: 0   Years of education: Not on file   Highest education level: Not on file  Occupational History   Occupation: Disabled    Comment: 2/2 RA  Tobacco Use   Smoking status: Former    Packs/day: 0.25    Years: 20.00    Additional pack years: 0.00    Total pack years: 5.00    Types: Cigarettes    Quit date: 02/19/2019    Years since quitting: 3.7   Smokeless tobacco: Never   Tobacco comments:    Patient stated he smokes a couple day and is attempting to quit smoking  Vaping Use   Vaping Use: Never used  Substance and Sexual Activity   Alcohol use: No    Alcohol/week: 0.0 standard drinks of alcohol    Comment: sober 1998   Drug use: No   Sexual activity: Not on file  Other Topics Concern   Not on file  Social History Narrative   On disability since 1992, used to work with city of [place]. Quit drinking in 1990.      Current Social History 11/05/2019        Patient lives by himself most of the time. Sometimes girlfriend's son stays with him in a one level home. There are 3 steps with handrail up to the entrance the patient uses.       Patient's method of transportation is personal truck. This was recently vandalized and  patient doesn't have funds to repair at this time.      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      Identified important Relationships are "My girlfriend, Selena Batten."       Pets : American Terrier Archivist), Lab (Roxy)       Interests / Fun: Walk, watch TV       Current Stressors: "Going through a lot the last 6-7 years; I worry too much about my health." (Discussed IBH, patient not interested at this time.)      Religious / Personal Beliefs: Baptist       L. Ducatte, BSN, RN-BC    Social Determinants of Health   Financial Resource Strain: Low Risk  (11/10/2022)   Overall Financial Resource Strain (CARDIA)    Difficulty of Paying Living Expenses: Not hard at all  Food Insecurity: No Food Insecurity (11/10/2022)   Hunger Vital Sign    Worried About Running Out of Food in the Last Year: Never true    [name] Out of Food in the Last Year: Never true  Transportation Needs: Unmet Transportation Needs (11/10/2022)   PRAPARE - Administrator, Civil Service (Medical): Yes    Lack of Transportation (Non-Medical): Yes  Physical Activity: Insufficiently Active (11/10/2022)   Exercise Vital Sign    Days of Exercise per Week: 2 days    Minutes of Exercise per Session: 10 min  Stress: No Stress Concern Present (11/10/2022)   [employer] of Occupational Health - Occupational Stress Questionnaire    Feeling of Stress : Not at all  Social Connections: Moderately Isolated (11/10/2022)   Social Connection and Isolation Panel [NHANES]    Frequency of Communication with Friends and Family: Twice a week    Frequency of Social Gatherings with Friends and Family: More than three times a week    Attends Religious Services: Never  Active Member of Clubs or Organizations: No    Attends Banker Meetings: Never    Marital Status: Living with partner    Tobacco Counseling Counseling given: Not Answered Tobacco comments: Patient  stated he smokes a couple day and is attempting to quit smoking   Clinical Intake:  Pre-visit preparation completed: Yes  Pain : 0-10 Pain Score: 8  Pain Type: Chronic pain Pain Location: Back Pain Orientation: Lower Pain Descriptors / Indicators: Constant, Aching, Discomfort Pain Onset: More than a month ago     Nutritional Risks: None Diabetes: No  How often do you need to have someone help you when you read instructions, pamphlets, or other written materials from your doctor or pharmacy?: 2 - Rarely What is the last grade level you completed in school?: 11 TH GRADE  Diabetic?NO   Interpreter Needed?: No  Information entered by :: [facility] Arleatha Philipps   Activities of Daily Living    11/10/2022   10:44 AM 11/09/2022    3:34 PM  In your present state of health, do you have any difficulty performing the following activities:  Hearing? 0 0  Vision? 0 0  Difficulty concentrating or making decisions? 0 0  Walking or climbing stairs? 1 1  Dressing or bathing? 1 1  Doing errands, shopping? 1 1  Preparing Food and eating ? Y   Using the Toilet? N   In the past six months, have you accidently leaked urine? N   Do you have problems with loss of bowel control? N   Managing your Medications? N   Managing your Finances? N   Housekeeping or managing your Housekeeping? Y     Patient Care Team: Rocky Morel, DO as PCP - General Marinus Maw, MD as PCP - Electrophysiology (Cardiology) Rollene Rotunda, MD as PCP - Cardiology (Cardiology) Ewing Schlein, MD as Referring Physician (Pain Medicine) Nada Libman, MD as Consulting Physician (Vascular Surgery) Sallye Lat, MD as Consulting Physician (Ophthalmology) Jerrell Mylar, Jacquelyne Balint, RN as Oncology Nurse Navigator Ladene Artist, MD as Consulting Physician (Oncology)  Indicate any recent Medical Services you may have received from other than [facility] providers in the past year (date may be approximate).     Assessment:    This is a routine wellness examination for Arnoldo.  Hearing/Vision screen No results found.  Dietary issues and exercise activities discussed: Current Exercise Habits: Home exercise routine, Type of exercise: walking, Time (Minutes): 10, Frequency (Times/Week): 2, Weekly Exercise (Minutes/Week): 20, Intensity: Mild   Goals Addressed   None   Depression Screen    11/10/2022   10:43 AM 11/09/2022    3:34 PM 10/30/2022   11:15 AM 09/27/2022    2:53 PM 06/15/2022    3:06 PM 04/19/2022    9:34 AM 03/02/2022    1:17 PM  PHQ 2/9 Scores  PHQ - 2 Score 0 0 0 0 0 0 0  PHQ- 9 Score 5 5     9     Fall Risk    11/10/2022   10:44 AM 11/09/2022    3:33 PM 10/30/2022   11:20 AM 10/30/2022   11:04 AM 08/16/2022    2:18 PM  Fall Risk   Falls in the past year? 0 0 0 0 0  Number falls in past yr: 0  0 0 0  Injury with Fall? 0  0 0 0  Risk for fall due to : Impaired balance/gait Impaired balance/gait Impaired balance/gait Impaired balance/gait   Follow up Falls prevention  discussed;Falls evaluation completed Falls evaluation completed Falls evaluation completed;Falls prevention discussed Falls prevention discussed;Falls evaluation completed Falls evaluation completed    FALL RISK PREVENTION PERTAINING TO THE HOME:  Any stairs in or around the home? Yes  If so, are there any without handrails? Yes  Home free of loose throw rugs in walkways, pet beds, electrical cords, etc? Yes  Adequate lighting in your home to reduce risk of falls? Yes   ASSISTIVE DEVICES UTILIZED TO PREVENT FALLS:  Life alert? No  Use of a cane, walker or w/c? Yes  Grab bars in the bathroom? Yes  Shower chair or bench in shower? Yes  Elevated toilet seat or a handicapped toilet? Yes   TIMED UP AND GO:  Was the test performed? No .  Length of time to ambulate 10 feet: N/A sec.     Cognitive Function:        11/10/2022   10:45 AM 10/30/2022   11:20 AM  6CIT Screen  What Year? 4 points 0 points  What month? 3 points 0  points  What time? 0 points 0 points  Count back from 20 0 points 0 points  Months in reverse 0 points 0 points  Repeat phrase 0 points 0 points  Total Score 7 points 0 points    Immunizations Immunization History  Administered Date(s) Administered   Influenza Split 04/08/2012, 03/28/2014   Influenza,inj,Quad PF,6+ Mos 06/01/2015, 02/25/2016, 04/30/2017, 03/06/2019, 05/29/2022   PNEUMOCOCCAL CONJUGATE-20 02/06/2022   Td 11/17/2009   Tdap 03/31/2017, 09/19/2021    TDAP status: Up to date  Flu Vaccine status: Up to date    Covid-19 vaccine status: Declined, Education has been provided regarding the importance of this vaccine but patient still declined. Advised may receive this vaccine at local pharmacy or Health Dept.or vaccine clinic. Aware to provide a copy of the vaccination record if obtained from local pharmacy or Health Dept. Verbalized acceptance and understanding.  Qualifies for Shingles Vaccine? Yes   Zostavax completed No   Shingrix Completed?: No.    Education has been provided regarding the importance of this vaccine. Patient has been advised to call insurance company to determine out of pocket expense if they have not yet received this vaccine. Advised may also receive vaccine at local pharmacy or Health Dept. Verbalized acceptance and understanding.  Screening Tests Health Maintenance  Topic Date Due   COVID-19 Vaccine (1) Never done   Zoster Vaccines- Shingrix (1 of 2) Never done   COLONOSCOPY (Pts 45-64yrs Insurance coverage will need to be confirmed)  04/19/2021   INFLUENZA VACCINE  01/25/2023   Medicare Annual Wellness (AWV)  11/10/2023   DTaP/Tdap/Td (4 - Td or Tdap) 09/20/2031   Hepatitis C Screening  Completed   HIV Screening  Completed   HPV VACCINES  Aged Out    Health Maintenance  Health Maintenance Due  Topic Date Due   COVID-19 Vaccine (1) Never done   Zoster Vaccines- Shingrix (1 of 2) Never done   COLONOSCOPY (Pts 45-33yrs Insurance coverage  will need to be confirmed)  04/19/2021    Colorectal cancer screening: No longer required.  ( PT DECLINED 04/19/2020)  Lung Cancer Screening: (Low Dose CT Chest recommended if Age 29-80 years, 30 pack-year currently smoking OR have quit w/in 15years.) does qualify.   Lung Cancer Screening Referral: DEFERRED TO PCP   Additional Screening:  Hepatitis C Screening: does qualify; Completed 02/11/2022  Vision Screening: Recommended annual ophthalmology exams for early detection of glaucoma and other disorders of  the eye. Is the patient up to date with their annual eye exam?  Yes  Who is the provider or what is the name of the office in which the patient attends annual eye exams? PT DOES NOT HAVE AN EYE CARE DR  If pt is not established with a provider, would they like to be referred to a provider to establish care? Yes .   Dental Screening: Recommended annual dental exams for proper oral hygiene  Community Resource Referral / Chronic Care Management: CRR required this visit?  No   CCM required this visit?  No      Plan:     I have personally reviewed and noted the following in the patient's chart:   Medical and social history Use of alcohol, tobacco or illicit drugs  Current medications and supplements including opioid prescriptions. Patient is currently taking opioid prescriptions. Information provided to patient regarding non-opioid alternatives. Patient advised to discuss non-opioid treatment plan with their provider. Functional ability and status Nutritional status Physical activity Advanced directives List of other physicians Hospitalizations, surgeries, and ER visits in previous 12 months Vitals Screenings to include cognitive, depression, and falls Referrals and appointments  In addition, I have reviewed and discussed with patient certain preventive protocols, quality metrics, and best practice recommendations. A written personalized care plan for preventive services as  well as general preventive health recommendations were provided to patient.     Derrell Lolling, CMA   11/10/2022   Nurse Notes: NON FACE TO FACE    Mr. Demers , Thank you for taking time to come for your Medicare Wellness Visit. I appreciate your ongoing commitment to your health goals. Please review the following plan we discussed and let me know if I can assist you in the future.   These are the goals we discussed:  Goals       Quit smoking / using tobacco      Walk daily 30-60 minutes each time (pt-stated)        This is a list of the screening recommended for you and due dates:  Health Maintenance  Topic Date Due   COVID-19 Vaccine (1) Never done   Zoster (Shingles) Vaccine (1 of 2) Never done   Colon Cancer Screening  04/19/2021   Flu Shot  01/25/2023   Medicare Annual Wellness Visit  11/10/2023   DTaP/Tdap/Td vaccine (4 - Td or Tdap) 09/20/2031   Hepatitis C Screening: USPSTF Recommendation to screen - Ages 31-79 yo.  Completed   HIV Screening  Completed   HPV Vaccine  Aged Out

## 2022-11-10 NOTE — Patient Instructions (Signed)
Health Maintenance, Male Adopting a healthy lifestyle and getting preventive care are important in promoting health and wellness. Ask your health care provider about: The right schedule for you to have regular tests and exams. Things you can do on your own to prevent diseases and keep yourself healthy. What should I know about diet, weight, and exercise? Eat a healthy diet  Eat a diet that includes plenty of vegetables, fruits, low-fat dairy products, and lean protein. Do not eat a lot of foods that are high in solid fats, added sugars, or sodium. Maintain a healthy weight Body mass index (BMI) is a measurement that can be used to identify possible weight problems. It estimates body fat based on height and weight. Your health care provider can help determine your BMI and help you achieve or maintain a healthy weight. Get regular exercise Get regular exercise. This is one of the most important things you can do for your health. Most adults should: Exercise for at least 150 minutes each week. The exercise should increase your heart rate and make you sweat (moderate-intensity exercise). Do strengthening exercises at least twice a week. This is in addition to the moderate-intensity exercise. Spend less time sitting. Even light physical activity can be beneficial. Watch cholesterol and blood lipids Have your blood tested for lipids and cholesterol at 63 years of age, then have this test every 5 years. You may need to have your cholesterol levels checked more often if: Your lipid or cholesterol levels are high. You are older than 63 years of age. You are at high risk for heart disease. What should I know about cancer screening? Many types of cancers can be detected early and may often be prevented. Depending on your health history and family history, you may need to have cancer screening at various ages. This may include screening for: Colorectal cancer. Prostate cancer. Skin cancer. Lung  cancer. What should I know about heart disease, diabetes, and high blood pressure? Blood pressure and heart disease High blood pressure causes heart disease and increases the risk of stroke. This is more likely to develop in people who have high blood pressure readings or are overweight. Talk with your health care provider about your target blood pressure readings. Have your blood pressure checked: Every 3-5 years if you are 18-39 years of age. Every year if you are 40 years old or older. If you are between the ages of 65 and 75 and are a current or former smoker, ask your health care provider if you should have a one-time screening for abdominal aortic aneurysm (AAA). Diabetes Have regular diabetes screenings. This checks your fasting blood sugar level. Have the screening done: Once every three years after age 45 if you are at a normal weight and have a low risk for diabetes. More often and at a younger age if you are overweight or have a high risk for diabetes. What should I know about preventing infection? Hepatitis B If you have a higher risk for hepatitis B, you should be screened for this virus. Talk with your health care provider to find out if you are at risk for hepatitis B infection. Hepatitis C Blood testing is recommended for: Everyone born from 1945 through 1965. Anyone with known risk factors for hepatitis C. Sexually transmitted infections (STIs) You should be screened each year for STIs, including gonorrhea and chlamydia, if: You are sexually active and are younger than 63 years of age. You are older than 63 years of age and your   health care provider tells you that you are at risk for this type of infection. Your sexual activity has changed since you were last screened, and you are at increased risk for chlamydia or gonorrhea. Ask your health care provider if you are at risk. Ask your health care provider about whether you are at high risk for HIV. Your health care provider  may recommend a prescription medicine to help prevent HIV infection. If you choose to take medicine to prevent HIV, you should first get tested for HIV. You should then be tested every 3 months for as long as you are taking the medicine. Follow these instructions at home: Alcohol use Do not drink alcohol if your health care provider tells you not to drink. If you drink alcohol: Limit how much you have to 0-2 drinks a day. Know how much alcohol is in your drink. In the [place] one drink equals one 12 oz bottle of beer (355 mL), one 5 oz glass of wine (148 mL), or one 1 oz glass of hard liquor (44 mL). Lifestyle Do not use any products that contain nicotine or tobacco. These products include cigarettes, chewing tobacco, and vaping devices, such as e-cigarettes. If you need help quitting, ask your health care provider. Do not use street drugs. Do not share needles. Ask your health care provider for help if you need support or information about quitting drugs. General instructions Schedule regular health, dental, and eye exams. Stay current with your vaccines. Tell your health care provider if: You often feel depressed. You have ever been abused or do not feel safe at home. Summary Adopting a healthy lifestyle and getting preventive care are important in promoting health and wellness. Follow your health care provider's instructions about healthy diet, exercising, and getting tested or screened for diseases. Follow your health care provider's instructions on monitoring your cholesterol and blood pressure. This information is not intended to replace advice given to you by your health care provider. Make sure you discuss any questions you have with your health care provider. Document Revised: 11/01/2020 Document Reviewed: 11/01/2020 Elsevier Patient Education  2023 [employer]  

## 2022-11-13 NOTE — Assessment & Plan Note (Signed)
Presents today for follow-up after hospitalization from 10/30/2022 - 11/02/2022.  Was seen in clinic on 10/30/2022 with a rash that was concerning for SJS.  He was admitted at [facility] and was evaluated by dermatology.  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  Discharge summary indicates that he was set to be treated with topical clindamycin but patient and family state that he was given some pills.  The rash is drastically improved and essentially resolved today.

## 2022-11-13 NOTE — Assessment & Plan Note (Signed)
Followed by [facility] pain clinic and has an appointment next week.  He has previously gotten benefit from gabapentin and is currently on 100 mg twice daily.  We will increase his gabapentin but would leave any further changes up to his pain management team. - Increase gabapentin to 300 mg twice daily, counseled on side effects

## 2022-11-14 NOTE — Progress Notes (Signed)
Internal Medicine Clinic Attending  Case discussed with Dr. Goodwin  At the time of the visit.  We reviewed the resident's history and exam and pertinent patient test results.  I agree with the assessment, diagnosis, and plan of care documented in the resident's note.  

## 2022-11-15 DIAGNOSIS — Z7902 Long term (current) use of antithrombotics/antiplatelets: Secondary | ICD-10-CM | POA: Diagnosis not present

## 2022-11-15 DIAGNOSIS — D630 Anemia in neoplastic disease: Secondary | ICD-10-CM | POA: Diagnosis not present

## 2022-11-15 DIAGNOSIS — Z993 Dependence on wheelchair: Secondary | ICD-10-CM | POA: Diagnosis not present

## 2022-11-15 DIAGNOSIS — I2510 Atherosclerotic heart disease of native coronary artery without angina pectoris: Secondary | ICD-10-CM | POA: Diagnosis not present

## 2022-11-15 DIAGNOSIS — E43 Unspecified severe protein-calorie malnutrition: Secondary | ICD-10-CM | POA: Diagnosis not present

## 2022-11-15 DIAGNOSIS — I739 Peripheral vascular disease, unspecified: Secondary | ICD-10-CM | POA: Diagnosis not present

## 2022-11-15 DIAGNOSIS — I472 Ventricular tachycardia, unspecified: Secondary | ICD-10-CM | POA: Diagnosis not present

## 2022-11-15 DIAGNOSIS — I252 Old myocardial infarction: Secondary | ICD-10-CM | POA: Diagnosis not present

## 2022-11-15 DIAGNOSIS — Z89612 Acquired absence of left leg above knee: Secondary | ICD-10-CM | POA: Diagnosis not present

## 2022-11-15 DIAGNOSIS — Z86718 Personal history of other venous thrombosis and embolism: Secondary | ICD-10-CM | POA: Diagnosis not present

## 2022-11-15 DIAGNOSIS — M069 Rheumatoid arthritis, unspecified: Secondary | ICD-10-CM | POA: Diagnosis not present

## 2022-11-15 DIAGNOSIS — Z79899 Other long term (current) drug therapy: Secondary | ICD-10-CM | POA: Diagnosis not present

## 2022-11-15 DIAGNOSIS — J9601 Acute respiratory failure with hypoxia: Secondary | ICD-10-CM | POA: Diagnosis not present

## 2022-11-15 DIAGNOSIS — M5137 Other intervertebral disc degeneration, lumbosacral region: Secondary | ICD-10-CM | POA: Diagnosis not present

## 2022-11-15 DIAGNOSIS — I10 Essential (primary) hypertension: Secondary | ICD-10-CM | POA: Diagnosis not present

## 2022-11-15 DIAGNOSIS — Z8616 Personal history of COVID-19: Secondary | ICD-10-CM | POA: Diagnosis not present

## 2022-11-15 DIAGNOSIS — J449 Chronic obstructive pulmonary disease, unspecified: Secondary | ICD-10-CM | POA: Diagnosis not present

## 2022-11-15 DIAGNOSIS — Z791 Long term (current) use of non-steroidal anti-inflammatories (NSAID): Secondary | ICD-10-CM | POA: Diagnosis not present

## 2022-11-15 DIAGNOSIS — Z79891 Long term (current) use of opiate analgesic: Secondary | ICD-10-CM | POA: Diagnosis not present

## 2022-11-15 DIAGNOSIS — Z9181 History of falling: Secondary | ICD-10-CM | POA: Diagnosis not present

## 2022-11-15 DIAGNOSIS — Z8673 Personal history of transient ischemic attack (TIA), and cerebral infarction without residual deficits: Secondary | ICD-10-CM | POA: Diagnosis not present

## 2022-11-15 DIAGNOSIS — J439 Emphysema, unspecified: Secondary | ICD-10-CM | POA: Diagnosis not present

## 2022-11-15 DIAGNOSIS — K219 Gastro-esophageal reflux disease without esophagitis: Secondary | ICD-10-CM | POA: Diagnosis not present

## 2022-11-15 DIAGNOSIS — F17210 Nicotine dependence, cigarettes, uncomplicated: Secondary | ICD-10-CM | POA: Diagnosis not present

## 2022-11-15 DIAGNOSIS — C771 Secondary and unspecified malignant neoplasm of intrathoracic lymph nodes: Secondary | ICD-10-CM | POA: Diagnosis not present

## 2022-11-15 NOTE — Addendum Note (Signed)
Addended by: Rocky Morel on: 11/15/2022 03:35 PM   Modules accepted: Level of Service

## 2022-11-16 ENCOUNTER — Inpatient Hospital Stay (HOSPITAL_BASED_OUTPATIENT_CLINIC_OR_DEPARTMENT_OTHER): Payer: 59 | Admitting: Nurse Practitioner

## 2022-11-16 ENCOUNTER — Telehealth: Payer: Self-pay

## 2022-11-16 ENCOUNTER — Inpatient Hospital Stay: Payer: 59 | Attending: Oncology

## 2022-11-16 ENCOUNTER — Encounter: Payer: Self-pay | Admitting: Nurse Practitioner

## 2022-11-16 ENCOUNTER — Inpatient Hospital Stay: Payer: 59

## 2022-11-16 VITALS — BP 141/93 | HR 81 | Resp 18

## 2022-11-16 VITALS — BP 118/63 | HR 77 | Temp 97.9°F | Resp 18 | Ht 68.0 in | Wt 136.0 lb

## 2022-11-16 DIAGNOSIS — Z5111 Encounter for antineoplastic chemotherapy: Secondary | ICD-10-CM | POA: Diagnosis not present

## 2022-11-16 DIAGNOSIS — C159 Malignant neoplasm of esophagus, unspecified: Secondary | ICD-10-CM

## 2022-11-16 DIAGNOSIS — C801 Malignant (primary) neoplasm, unspecified: Secondary | ICD-10-CM

## 2022-11-16 DIAGNOSIS — R970 Elevated carcinoembryonic antigen [CEA]: Secondary | ICD-10-CM | POA: Insufficient documentation

## 2022-11-16 DIAGNOSIS — Z79899 Other long term (current) drug therapy: Secondary | ICD-10-CM | POA: Insufficient documentation

## 2022-11-16 DIAGNOSIS — C787 Secondary malignant neoplasm of liver and intrahepatic bile duct: Secondary | ICD-10-CM | POA: Diagnosis not present

## 2022-11-16 DIAGNOSIS — D509 Iron deficiency anemia, unspecified: Secondary | ICD-10-CM | POA: Insufficient documentation

## 2022-11-16 DIAGNOSIS — D696 Thrombocytopenia, unspecified: Secondary | ICD-10-CM | POA: Insufficient documentation

## 2022-11-16 LAB — CBC WITH DIFFERENTIAL (CANCER CENTER ONLY)
Abs Immature Granulocytes: 0.01 10*3/uL (ref 0.00–0.07)
Basophils Absolute: 0.1 10*3/uL (ref 0.0–0.1)
Basophils Relative: 1 %
Eosinophils Absolute: 0.3 10*3/uL (ref 0.0–0.5)
Eosinophils Relative: 6 %
HCT: 36.7 % — ABNORMAL LOW (ref 39.0–52.0)
Hemoglobin: 11.7 g/dL — ABNORMAL LOW (ref 13.0–17.0)
Immature Granulocytes: 0 %
Lymphocytes Relative: 23 %
Lymphs Abs: 1.3 10*3/uL (ref 0.7–4.0)
MCH: 29.1 pg (ref 26.0–34.0)
MCHC: 31.9 g/dL (ref 30.0–36.0)
MCV: 91.3 fL (ref 80.0–100.0)
Monocytes Absolute: 0.4 10*3/uL (ref 0.1–1.0)
Monocytes Relative: 8 %
Neutro Abs: 3.5 10*3/uL (ref 1.7–7.7)
Neutrophils Relative %: 62 %
Platelet Count: 106 10*3/uL — ABNORMAL LOW (ref 150–400)
RBC: 4.02 MIL/uL — ABNORMAL LOW (ref 4.22–5.81)
RDW: 17.2 % — ABNORMAL HIGH (ref 11.5–15.5)
WBC Count: 5.7 10*3/uL (ref 4.0–10.5)
nRBC: 0 % (ref 0.0–0.2)

## 2022-11-16 LAB — CMP (CANCER CENTER ONLY)
ALT: 17 U/L (ref 0–44)
AST: 24 U/L (ref 15–41)
Albumin: 3.3 g/dL — ABNORMAL LOW (ref 3.5–5.0)
Alkaline Phosphatase: 150 U/L — ABNORMAL HIGH (ref 38–126)
Anion gap: 7 (ref 5–15)
BUN: 17 mg/dL (ref 8–23)
CO2: 23 mmol/L (ref 22–32)
Calcium: 8.7 mg/dL — ABNORMAL LOW (ref 8.9–10.3)
Chloride: 108 mmol/L (ref 98–111)
Creatinine: 0.89 mg/dL (ref 0.61–1.24)
GFR, Estimated: >60 mL/min (ref >60)
Glucose, Bld: 104 mg/dL — ABNORMAL HIGH (ref 70–99)
Potassium: 4.1 mmol/L (ref 3.5–5.1)
Sodium: 138 mmol/L (ref 135–145)
Total Bilirubin: 0.4 mg/dL (ref 0.3–1.2)
Total Protein: 6.5 g/dL (ref 6.5–8.1)

## 2022-11-16 LAB — CEA (ACCESS): CEA (CHCC): 227.42 ng/mL — ABNORMAL HIGH (ref 0.00–5.00)

## 2022-11-16 LAB — TSH: TSH: 4.722 u[IU]/mL — ABNORMAL HIGH (ref 0.350–4.500)

## 2022-11-16 MED ORDER — PALONOSETRON HCL INJECTION 0.25 MG/5ML
0.2500 mg | Freq: Once | INTRAVENOUS | Status: AC
  Administered 2022-11-16: 0.25 mg via INTRAVENOUS
  Filled 2022-11-16: qty 5

## 2022-11-16 MED ORDER — HEPARIN SOD (PORK) LOCK FLUSH 100 UNIT/ML IV SOLN
500.0000 [IU] | Freq: Once | INTRAVENOUS | Status: AC | PRN
  Administered 2022-11-16: 500 [IU]

## 2022-11-16 MED ORDER — DIPHENHYDRAMINE HCL 50 MG/ML IJ SOLN
50.0000 mg | Freq: Once | INTRAMUSCULAR | Status: AC
  Administered 2022-11-16: 50 mg via INTRAVENOUS
  Filled 2022-11-16: qty 1

## 2022-11-16 MED ORDER — SODIUM CHLORIDE 0.9 % IV SOLN
10.0000 mg | Freq: Once | INTRAVENOUS | Status: AC
  Administered 2022-11-16: 10 mg via INTRAVENOUS
  Filled 2022-11-16: qty 10

## 2022-11-16 MED ORDER — SODIUM CHLORIDE 0.9% FLUSH
10.0000 mL | INTRAVENOUS | Status: DC | PRN
  Administered 2022-11-16: 10 mL

## 2022-11-16 MED ORDER — SODIUM CHLORIDE 0.9 % IV SOLN
60.0000 mg/m2 | Freq: Once | INTRAVENOUS | Status: AC
  Administered 2022-11-16: 102 mg via INTRAVENOUS
  Filled 2022-11-16: qty 17

## 2022-11-16 MED ORDER — SODIUM CHLORIDE 0.9 % IV SOLN: Freq: Once | INTRAVENOUS | Status: AC

## 2022-11-16 MED ORDER — FAMOTIDINE IN NACL 20-0.9 MG/50ML-% IV SOLN
20.0000 mg | Freq: Once | INTRAVENOUS | Status: AC
  Administered 2022-11-16: 20 mg via INTRAVENOUS
  Filled 2022-11-16: qty 50

## 2022-11-16 MED ORDER — SODIUM CHLORIDE 0.9 % IV SOLN
8.0000 mg/kg | Freq: Once | INTRAVENOUS | Status: AC
  Administered 2022-11-16: 500 mg via INTRAVENOUS
  Filled 2022-11-16: qty 50

## 2022-11-16 NOTE — Patient Instructions (Signed)
[place] [facility]   Discharge Instructions: Thank you for choosing [facility] to provide your oncology and hematology care.   If you have a lab appointment with the Cancer Center, please go directly to the Cancer Center and check in at the registration area.   Wear comfortable clothing and clothing appropriate for easy access to any Portacath or PICC line.   We strive to give you quality time with your provider. You may need to reschedule your appointment if you arrive late (15 or more minutes).  Arriving late affects you and other patients whose appointments are after yours.  Also, if you miss three or more appointments without notifying the office, you may be dismissed from the clinic at the provider's discretion.      For prescription refill requests, have your pharmacy contact our office and allow 72 hours for refills to be completed.    Today you received the following chemotherapy and/or immunotherapy agents Ramucirumab (CYRAMZA) & Paclitaxel (TAXOL).      To help prevent nausea and vomiting after your treatment, we encourage you to take your nausea medication as directed.  BELOW ARE SYMPTOMS THAT SHOULD BE REPORTED IMMEDIATELY: *FEVER GREATER THAN 100.4 F (38 C) OR HIGHER *CHILLS OR SWEATING *NAUSEA AND VOMITING THAT IS NOT CONTROLLED WITH YOUR NAUSEA MEDICATION *UNUSUAL SHORTNESS OF BREATH *UNUSUAL BRUISING OR BLEEDING *URINARY PROBLEMS (pain or burning when urinating, or frequent urination) *BOWEL PROBLEMS (unusual diarrhea, constipation, pain near the anus) TENDERNESS IN MOUTH AND THROAT WITH OR WITHOUT PRESENCE OF ULCERS (sore throat, sores in mouth, or a toothache) UNUSUAL RASH, SWELLING OR PAIN  UNUSUAL VAGINAL DISCHARGE OR ITCHING   Items with * indicate a potential emergency and should be followed up as soon as possible or go to the Emergency Department if any problems should occur.  Please show the CHEMOTHERAPY ALERT CARD or  IMMUNOTHERAPY ALERT CARD at check-in to the Emergency Department and triage nurse.  Should you have questions after your visit or need to cancel or reschedule your appointment, please contact [place] [facility]  Dept: [phone]  and follow the prompts.  Office hours are 8:00 a.m. to 4:30 p.m. Monday - Friday. Please note that voicemails left after 4:00 p.m. may not be returned until the following business day.  We are closed weekends and major holidays. You have access to a nurse at all times for urgent questions. Please call the main number to the clinic Dept: [phone] and follow the prompts.   For any non-urgent questions, you may also contact your provider using MyChart. We now offer e-Visits for anyone 72 and older to request care online for non-urgent symptoms. For details visit [email or web address].   Also download the MyChart app! Go to the app store, search "MyChart", open the app, select , and log in with your MyChart username and password.  Ramucirumab Injection What is this medication? RAMUCIRUMAB (ra mue SIR ue mab) treats some types of cancer. It works by blocking a protein that causes cancer cells to grow and multiply. This helps to slow or stop the spread of cancer cells. It is a monoclonal antibody. This medicine may be used for other purposes; ask your health care provider or pharmacist if you have questions. COMMON BRAND NAME(S): Cyramza What should I tell my care team before I take this medication? They need to know if you have any of these conditions: Blood clots Having or recent surgery Heart attack High blood  pressure History of a tear in your stomach or intestines Liver disease Protein in your urine Stomach bleeding Stroke Thyroid disease An unusual or allergic reaction to ramucirumab, other medications, foods, dyes, or preservatives Pregnant or trying to get pregnant Breast-feeding How should I use this  medication? This medication is injected into a vein. It is given by your care team in a hospital or clinic setting. Talk to your care team about the use of this medication in children. Special care may be needed. Overdosage: If you think you have taken too much of this medicine contact a poison control center or emergency room at once. NOTE: This medicine is only for you. Do not share this medicine with others. What if I miss a dose? Keep appointments for follow-up doses. It is important not to miss your dose. Call your care team if you are unable to keep an appointment. What may interact with this medication? Interactions have not been studied. This list may not describe all possible interactions. Give your health care provider a list of all the medicines, herbs, non-prescription drugs, or dietary supplements you use. Also tell them if you smoke, drink alcohol, or use illegal drugs. Some items may interact with your medicine. What should I watch for while using this medication? Your condition will be monitored carefully while you are receiving this medication. You may need blood work while taking this medication. This medication may make you feel generally unwell. This is not uncommon as chemotherapy can affect health cells as well as cancer cells. Report any side effects. Continue your course of treatment even though you feel ill unless your care team tells you to stop. This medication may increase your risk to bruise or bleed. Call your care team if you notice any unusual bleeding. Before having surgery, talk to your care team to make sure it is ok. This medication can increase the risk of poor healing of your surgical site or wound. You will need to stop this medication for 28 days before surgery. After surgery, wait at least 2 weeks before restarting this medication. Make sure the surgical site or wound is healed enough before restarting this medication. Talk to your care team if questions. Talk  to your care team if you may be pregnant. Serious birth defects can occur if you take this medication during pregnancy and for 3 months after the last dose. You will need a negative pregnancy test before starting this medication. Contraception is recommended while taking this medication and for 3 months after the last dose. Your care team can help you find the option that works for you. Do not breastfeed while taking this medication and for 2 months after the last dose. This medication may cause infertility. Talk to your care team if you are concerned about your fertility. What side effects may I notice from receiving this medication? Side effects that you should report to your care team as soon as possible: Allergic reactions--skin rash, itching, hives, swelling of the face, lips, tongue, or throat Bleeding--bloody or black, tar-like stools, vomiting blood or brown material that looks like coffee grounds, red or dark brown urine, small red or purple spots on skin, unusual bruising or bleeding Dizziness, loss of balance or coordination, confusion or trouble speaking Heart attack--pain or tightness in the chest, shoulders, arms, or jaw, nausea, shortness of breath, cold or clammy skin, feeling faint or lightheaded Increase in blood pressure Infection--fever, chills, cough, sore throat, wounds that don't heal, pain or trouble when passing urine,  general feeling of discomfort or being unwell Infusion reactions--chest pain, shortness of breath or trouble breathing, feeling faint or lightheaded Kidney injury--decrease in the amount of urine, swelling of the ankles, hands, or feet Liver injury--right upper belly pain, loss of appetite, nausea, light-colored stool, dark yellow or brown urine, yellowing skin or eyes, unusual weakness or fatigue Low thyroid levels (hypothyroidism)--unusual weakness or fatigue, increased sensitivity to cold, constipation, hair loss, dry skin, weight gain, feelings of  depression Stomach pain that is severe, does not go away, or gets worse Stroke--sudden numbness or weakness of the face, arm, or leg, trouble speaking, confusion, trouble walking, loss of balance or coordination, dizziness, severe headache, change in vision Sudden and severe headache, confusion, change in vision, seizures, which may be signs of posterior reversible encephalopathy syndrome (PRES) Side effects that usually do not require medical attention (report to your care team if they continue or are bothersome): Diarrhea Fatigue Stomach pain Swelling of the ankles, hands, or feet This list may not describe all possible side effects. Call your doctor for medical advice about side effects. You may report side effects to FDA at 1-800-FDA-1088. Where should I keep my medication? This medication is given in a hospital or clinic. It will not be stored at home. NOTE: This sheet is a summary. It may not cover all possible information. If you have questions about this medicine, talk to your doctor, pharmacist, or health care provider.  2024 Elsevier/Gold Standard (2021-11-03 00:00:00)  Paclitaxel Injection What is this medication? PACLITAXEL (PAK li TAX el) treats some types of cancer. It works by slowing down the growth of cancer cells. This medicine may be used for other purposes; ask your health care provider or pharmacist if you have questions. COMMON BRAND NAME(S): Onxol, Taxol What should I tell my care team before I take this medication? They need to know if you have any of these conditions: Heart disease Liver disease Low white blood cell levels An unusual or allergic reaction to paclitaxel, other medications, foods, dyes, or preservatives If you or your partner are pregnant or trying to get pregnant Breast-feeding How should I use this medication? This medication is injected into a vein. It is given by your care team in a hospital or clinic setting. Talk to your care team about the  use of this medication in children. While it may be given to children for selected conditions, precautions do apply. Overdosage: If you think you have taken too much of this medicine contact a poison control center or emergency room at once. NOTE: This medicine is only for you. Do not share this medicine with others. What if I miss a dose? Keep appointments for follow-up doses. It is important not to miss your dose. Call your care team if you are unable to keep an appointment. What may interact with this medication? Do not take this medication with any of the following: Live virus vaccines Other medications may affect the way this medication works. Talk with your care team about all of the medications you take. They may suggest changes to your treatment plan to lower the risk of side effects and to make sure your medications work as intended. This list may not describe all possible interactions. Give your health care provider a list of all the medicines, herbs, non-prescription drugs, or dietary supplements you use. Also tell them if you smoke, drink alcohol, or use illegal drugs. Some items may interact with your medicine. What should I watch for while using this medication?  Your condition will be monitored carefully while you are receiving this medication. You may need blood work while taking this medication. This medication may make you feel generally unwell. This is not uncommon as chemotherapy can affect healthy cells as well as cancer cells. Report any side effects. Continue your course of treatment even though you feel ill unless your care team tells you to stop. This medication can cause serious allergic reactions. To reduce the risk, your care team may give you other medications to take before receiving this one. Be sure to follow the directions from your care team. This medication may increase your risk of getting an infection. Call your care team for advice if you get a fever, chills, sore  throat, or other symptoms of a cold or flu. Do not treat yourself. Try to avoid being around people who are sick. This medication may increase your risk to bruise or bleed. Call your care team if you notice any unusual bleeding. Be careful brushing or flossing your teeth or using a toothpick because you may get an infection or bleed more easily. If you have any dental work done, tell your dentist you are receiving this medication. Talk to your care team if you may be pregnant. Serious birth defects can occur if you take this medication during pregnancy. Talk to your care team before breastfeeding. Changes to your treatment plan may be needed. What side effects may I notice from receiving this medication? Side effects that you should report to your care team as soon as possible: Allergic reactions--skin rash, itching, hives, swelling of the face, lips, tongue, or throat Heart rhythm changes--fast or irregular heartbeat, dizziness, feeling faint or lightheaded, chest pain, trouble breathing Increase in blood pressure Infection--fever, chills, cough, sore throat, wounds that don't heal, pain or trouble when passing urine, general feeling of discomfort or being unwell Low blood pressure--dizziness, feeling faint or lightheaded, blurry vision Low red blood cell level--unusual weakness or fatigue, dizziness, headache, trouble breathing Painful swelling, warmth, or redness of the skin, blisters or sores at the infusion site Pain, tingling, or numbness in the hands or feet Slow heartbeat--dizziness, feeling faint or lightheaded, confusion, trouble breathing, unusual weakness or fatigue Unusual bruising or bleeding Side effects that usually do not require medical attention (report to your care team if they continue or are bothersome): Diarrhea Hair loss Joint pain Loss of appetite Muscle pain Nausea Vomiting This list may not describe all possible side effects. Call your doctor for medical advice  about side effects. You may report side effects to FDA at 1-800-FDA-1088. Where should I keep my medication? This medication is given in a hospital or clinic. It will not be stored at home. NOTE: This sheet is a summary. It may not cover all possible information. If you have questions about this medicine, talk to your doctor, pharmacist, or health care provider.  2024 Elsevier/Gold Standard (2021-11-01 00:00:00)

## 2022-11-16 NOTE — Progress Notes (Signed)
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  He completed cycle 3 Taxol/cyramza 10/19/2022.  He was hospitalized at [facility] 10/30/2022 with a rash.  SJS and disseminated zoster ruled out.  He reports the rash occurred about 2 days after the treatment.  The rash was fairly generalized, pruritic.  Mouth and palms were not affected.  He denies nausea/vomiting.  No mouth sores.  He had loose stools for a few days after chemo.  He took Imodium with good relief.  Objective:  Vital signs in last 24 hours:  Blood pressure 118/63, pulse 77, temperature 97.9 F (36.6 C), temperature source Oral, resp. rate 18, height 5\' 8"  (1.727 m), weight 136 lb (61.7 kg), SpO2 98 %.    HEENT: No thrush or ulcers. Resp: Lungs clear bilaterally. Cardio: Regular rate and rhythm. GI: No hepatosplenomegaly. Vascular: No right leg edema. Skin: No rash. Port-A-Cath without erythema.  Lab Results:  Lab Results  Component Value Date   WBC 5.7 11/16/2022   HGB 11.7 (L) 11/16/2022   HCT 36.7 (L) 11/16/2022   MCV 91.3 11/16/2022   PLT 106 (L) 11/16/2022   NEUTROABS 3.5 11/16/2022    Imaging:  No results found.  Medications: I have reviewed the patient's current medications.  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 -02/11/2022 CTA chest-7.8 x 8.5 cm subtle hypoattenuating mass lesion in segment IV of the liver. -02/11/2022 CT abdomen/pelvis-8.7 x 7.8 or ill-defined irregular mass in the medial segment left liver, 4.5 x 3.4 cm necrotic mass lesion just cranial to the esophagogastric junction with metastatic lymphadenopathy in the abdomen and pelvis, irregular nodular peritoneal thickening in the lower abdomen and pelvis -02/11/2022  MRI abdomen-rim-enhancing likely necrotic mass or lymph node conglomerate centered around the gastroesophageal junction measuring 5.0 x 3.9 cm, large rim-enhancing likely necrotic liver mass centered in the anterior left lobe of the liver measuring 7.8 x 7.2 cm, enlarged gastrohepatic ligament and retroperitoneal lymph nodes -Labs from 02/11/2022-AFP 2.4, CA 19.9 88, CEA 287 -Ultrasound-guided liver biopsy performed 02/15/2022-adenocarcinoma with extensive necrosis, CK7 positive, negative for CK20, CDX2, TTF-1, and PAX8, consistent with a pancreatic, lung, or upper gastrointestinal primary -PET 03/08/2022-hypermetabolic distal esophagus mass, hypermetabolic centrally necrotic hepatic mass, hypermetabolic periportal nodes, exophytic GE junction mass without hypermetabolism-likely benign lesion, no hypermetabolism in the pancreas head, bilateral hypermetabolic adrenal gland lesions, small hypermetabolic left parotid gland lesion.  No loss of mismatch repair protein expression, HER2 3+ -CTs 03/11/2022-no change from PET 03/08/2022, known distal esophagus mass-not visualized on unenhanced CT, large hepatic metastasis and upper abdominal nodal metastases, chronic pancreatitis with 4.3 cm pseudocyst in the pancreas head -Cycle 1 FOLFOX, trastuzumab, Pembrolizumab 04/06/2022 -Cycle 2 oxaliplatin, 5-FU bolus/pump and trastuzumab held due to hospitalization following cycle 1 with possible coronary vasospasm -Pembrolizumab 04/25/2022 -CT abdomen/pelvis 05/02/2022-increased peripancreatic edema, increased size of pancreas head pseudocyst, increased necrotic nodal masses at the GE junction -Cycle 3 oxaliplatin, trastuzumab, pembrolizumab 05/15/2022; 5-FU held secondary to cardiac toxicity following cycle 1 -Cycle 4 oxaliplatin, trastuzumab 05/29/2022, 5-FU held secondary to cardiac toxicity -Cycle 5 oxaliplatin, trastuzumab, Pembrolizumab (6-week dosing for Pembrolizumab) 06/12/2022, 5-FU held secondary to cardiac  toxicity -Cycle 6 oxaliplatin, trastuzumab 06/28/2022, 5-FU held -Cycle 7 oxaliplatin, trastuzumab 07/17/2022, 5-FU held -CTs 07/19/2022-decrease in the dominant anterior liver mass, 2 new small liver lesions compared to 05/02/2022, new right  lower lobe nodule, enlargement of epicardial lymph nodes, slight decrease in size of celiac axis, gastropathic ligament, and portacaval lymphadenopathy, unchanged esophageal thickening -Cycle 8 oxaliplatin and pembrolizumab 08/14/2022, 5-FU and trastuzumab held secondary to potential cardiac toxicity -CTs 09/12/2022-preliminary review consistent with disease progression -Cycle 1 Taxol/cyramza 09/20/2022 -Cycle 2 Taxol/Cyramza 10/04/2022 -Cycle 3 Taxol/Cyramza 10/19/2022 (Taxol dose reduced secondary to thrombocytopenia) -Cycle 4 Taxol/cyramza 11/16/2022   2.  Myocardial infarction/cardiac arrest 01/31/2022 -Echocardiogram 02/01/2022-LVEF 60-65%, right ventricle severely dilated with hypokinesis in the basal and midportion, hypokinesis in the apical portion, right ventricular systolic function moderately reduced -Echocardiogram on 05/01/2022, LVEF 50-55%, decreased right ventricular systolic function, moderately enlarged right ventricle -Echocardiogram 08/05/2022-LVEF 45-50%, mildly decreased left ventricular function, moderate reduced right ventricular function 3.  Microcytic anemia -Labs from 02/11/2022-ferritin 151, iron 13, percent saturation 5% 4.  DVT/pulmonary embolism-bilateral lower extremity DVTs 01/10/2022, pulmonary emboli on chest CT 01/31/2022-apixaban 5.  PAD status post AKA in 2021 6.  RA 7.  Bilateral pleural effusions -Ultrasound-guided thoracentesis performed on 02/11/2022.  Cytology negative for malignancy. 8.  COPD 9.  Admission 03/11/2022 with E. coli bacteremia Ultrasound-guided biopsy/aspiration of dominant necrotic appearing liver lesion 03/17/2022-scant fluid aspirated-negative culture, no organisms or white cells.  Pathology with necrotic tissue. 10.   Admission 04/29/2022 after a fall, multiple skull fractures 11.  Fever, hypotension-Klebsiella bacteremia on blood culture 04/30/2022 12.  Admission 04/09/2022 with chest pain, NSTEMI 13.  Admission 08/04/2022 with ventricular tachycardia, status post cardioversion, noted to have hypokalemia and hypomagnesemia, started on amiodarone 14.  Rash following cycle 3 Taxol/ramucirumab-admitted to [facility] due to concern for SJS.  SJS and disseminated zoster ruled out.    Disposition: [name] [name] appears stable.  He has completed 3 cycles of Taxol/cyramza.  Following cycle 3 he developed a generalized rash of unclear etiology.  He was evaluated at [facility], not felt to have SJS or disseminated zoster, potentially "chemotherapy associated folliculitis".  The rash has completely resolved.  Etiology of the rash remains unclear.  He understands the rash was potentially related to Taxol or cyramza.  He would like to proceed with treatment today as scheduled and is willing to accept the risk of the rash recurring.  He understands to contact the office if the rash does recur.  Plan to proceed with cycle 4 Taxol/cyramza today as scheduled.  CBC and chemistry panel reviewed.  Labs adequate to proceed as above.  He will return for lab, follow-up, Taxol/cyramza in 2 weeks.  We are available to see him sooner if needed.  Patient seen with Dr. Truett Perna.   Lonna Cobb ANP/GNP-BC   11/16/2022  10:16 AM This was a shared visit with Lonna Cobb.  [name] [name] has completed 3 treatments with Taxol/ramucirumab.  He developed a rash following the last cycle of chemotherapy.  It is unclear whether the rash was related to chemotherapy.  The rash did not have the appearance of a typical drug rash.  Stevens-Johnson syndrome was ruled out by the dermatology team at [facility].  We discussed the risk of proceeding with Taxol/ramucirumab today.  The CEA is lower again today.  He appears well and there is no clinical evidence for progression  of cancer.  He understands the risk of a recurrent rash with another cycle of the same chemotherapy.  He agrees to proceed.  He will contact us if he develops a rash following this cycle of chemotherapy.  I was present for greater than 50% of today's visit.  I performed medical decision making.  Nida Boatman  Truett Perna, MD

## 2022-11-16 NOTE — Progress Notes (Signed)
Patient seen by Lisa Thomas NP today  Vitals are within treatment parameters.  Labs reviewed by Lisa Thomas NP and are within treatment parameters.  Per physician team, patient is ready for treatment and there are NO modifications to the treatment plan.     

## 2022-11-16 NOTE — Telephone Encounter (Signed)
The patient was provided with a case of nutritional supplement to support weight maintenance.

## 2022-11-17 ENCOUNTER — Other Ambulatory Visit: Payer: Self-pay | Admitting: Oncology

## 2022-11-17 LAB — T4: T4, Total: 14.9 ug/dL — ABNORMAL HIGH (ref 4.5–12.0)

## 2022-11-21 DIAGNOSIS — K219 Gastro-esophageal reflux disease without esophagitis: Secondary | ICD-10-CM | POA: Diagnosis not present

## 2022-11-21 DIAGNOSIS — M069 Rheumatoid arthritis, unspecified: Secondary | ICD-10-CM | POA: Diagnosis not present

## 2022-11-21 DIAGNOSIS — Z8673 Personal history of transient ischemic attack (TIA), and cerebral infarction without residual deficits: Secondary | ICD-10-CM | POA: Diagnosis not present

## 2022-11-21 DIAGNOSIS — I2510 Atherosclerotic heart disease of native coronary artery without angina pectoris: Secondary | ICD-10-CM | POA: Diagnosis not present

## 2022-11-21 DIAGNOSIS — Z86718 Personal history of other venous thrombosis and embolism: Secondary | ICD-10-CM | POA: Diagnosis not present

## 2022-11-21 DIAGNOSIS — J449 Chronic obstructive pulmonary disease, unspecified: Secondary | ICD-10-CM | POA: Diagnosis not present

## 2022-11-21 DIAGNOSIS — Z7902 Long term (current) use of antithrombotics/antiplatelets: Secondary | ICD-10-CM | POA: Diagnosis not present

## 2022-11-21 DIAGNOSIS — Z8616 Personal history of COVID-19: Secondary | ICD-10-CM | POA: Diagnosis not present

## 2022-11-21 DIAGNOSIS — J9601 Acute respiratory failure with hypoxia: Secondary | ICD-10-CM | POA: Diagnosis not present

## 2022-11-21 DIAGNOSIS — E43 Unspecified severe protein-calorie malnutrition: Secondary | ICD-10-CM | POA: Diagnosis not present

## 2022-11-21 DIAGNOSIS — Z9181 History of falling: Secondary | ICD-10-CM | POA: Diagnosis not present

## 2022-11-21 DIAGNOSIS — I739 Peripheral vascular disease, unspecified: Secondary | ICD-10-CM | POA: Diagnosis not present

## 2022-11-21 DIAGNOSIS — Z993 Dependence on wheelchair: Secondary | ICD-10-CM | POA: Diagnosis not present

## 2022-11-21 DIAGNOSIS — J439 Emphysema, unspecified: Secondary | ICD-10-CM | POA: Diagnosis not present

## 2022-11-21 DIAGNOSIS — Z89612 Acquired absence of left leg above knee: Secondary | ICD-10-CM | POA: Diagnosis not present

## 2022-11-21 DIAGNOSIS — I252 Old myocardial infarction: Secondary | ICD-10-CM | POA: Diagnosis not present

## 2022-11-21 DIAGNOSIS — Z79891 Long term (current) use of opiate analgesic: Secondary | ICD-10-CM | POA: Diagnosis not present

## 2022-11-21 DIAGNOSIS — I10 Essential (primary) hypertension: Secondary | ICD-10-CM | POA: Diagnosis not present

## 2022-11-21 DIAGNOSIS — D630 Anemia in neoplastic disease: Secondary | ICD-10-CM | POA: Diagnosis not present

## 2022-11-21 DIAGNOSIS — I472 Ventricular tachycardia, unspecified: Secondary | ICD-10-CM | POA: Diagnosis not present

## 2022-11-21 DIAGNOSIS — M5137 Other intervertebral disc degeneration, lumbosacral region: Secondary | ICD-10-CM | POA: Diagnosis not present

## 2022-11-21 DIAGNOSIS — Z791 Long term (current) use of non-steroidal anti-inflammatories (NSAID): Secondary | ICD-10-CM | POA: Diagnosis not present

## 2022-11-21 DIAGNOSIS — C771 Secondary and unspecified malignant neoplasm of intrathoracic lymph nodes: Secondary | ICD-10-CM | POA: Diagnosis not present

## 2022-11-21 NOTE — Progress Notes (Signed)
Internal Medicine Clinic Attending  Case and documentation of Dr. Goodwin reviewed.  I reviewed the AWV findings.  I agree with the assessment, diagnosis, and plan of care documented in the AWV note.     

## 2022-11-23 ENCOUNTER — Telehealth: Payer: Self-pay | Admitting: *Deleted

## 2022-11-23 DIAGNOSIS — C771 Secondary and unspecified malignant neoplasm of intrathoracic lymph nodes: Secondary | ICD-10-CM | POA: Diagnosis not present

## 2022-11-23 DIAGNOSIS — K219 Gastro-esophageal reflux disease without esophagitis: Secondary | ICD-10-CM | POA: Diagnosis not present

## 2022-11-23 DIAGNOSIS — I2510 Atherosclerotic heart disease of native coronary artery without angina pectoris: Secondary | ICD-10-CM | POA: Diagnosis not present

## 2022-11-23 DIAGNOSIS — Z8673 Personal history of transient ischemic attack (TIA), and cerebral infarction without residual deficits: Secondary | ICD-10-CM | POA: Diagnosis not present

## 2022-11-23 DIAGNOSIS — M069 Rheumatoid arthritis, unspecified: Secondary | ICD-10-CM | POA: Diagnosis not present

## 2022-11-23 DIAGNOSIS — J449 Chronic obstructive pulmonary disease, unspecified: Secondary | ICD-10-CM | POA: Diagnosis not present

## 2022-11-23 DIAGNOSIS — Z79891 Long term (current) use of opiate analgesic: Secondary | ICD-10-CM | POA: Diagnosis not present

## 2022-11-23 DIAGNOSIS — Z86718 Personal history of other venous thrombosis and embolism: Secondary | ICD-10-CM | POA: Diagnosis not present

## 2022-11-23 DIAGNOSIS — I472 Ventricular tachycardia, unspecified: Secondary | ICD-10-CM | POA: Diagnosis not present

## 2022-11-23 DIAGNOSIS — J439 Emphysema, unspecified: Secondary | ICD-10-CM | POA: Diagnosis not present

## 2022-11-23 DIAGNOSIS — J9601 Acute respiratory failure with hypoxia: Secondary | ICD-10-CM | POA: Diagnosis not present

## 2022-11-23 DIAGNOSIS — Z9181 History of falling: Secondary | ICD-10-CM | POA: Diagnosis not present

## 2022-11-23 DIAGNOSIS — Z7902 Long term (current) use of antithrombotics/antiplatelets: Secondary | ICD-10-CM | POA: Diagnosis not present

## 2022-11-23 DIAGNOSIS — I739 Peripheral vascular disease, unspecified: Secondary | ICD-10-CM | POA: Diagnosis not present

## 2022-11-23 DIAGNOSIS — E43 Unspecified severe protein-calorie malnutrition: Secondary | ICD-10-CM | POA: Diagnosis not present

## 2022-11-23 DIAGNOSIS — D630 Anemia in neoplastic disease: Secondary | ICD-10-CM | POA: Diagnosis not present

## 2022-11-23 DIAGNOSIS — Z89612 Acquired absence of left leg above knee: Secondary | ICD-10-CM | POA: Diagnosis not present

## 2022-11-23 DIAGNOSIS — I10 Essential (primary) hypertension: Secondary | ICD-10-CM | POA: Diagnosis not present

## 2022-11-23 DIAGNOSIS — M5137 Other intervertebral disc degeneration, lumbosacral region: Secondary | ICD-10-CM | POA: Diagnosis not present

## 2022-11-23 DIAGNOSIS — Z791 Long term (current) use of non-steroidal anti-inflammatories (NSAID): Secondary | ICD-10-CM | POA: Diagnosis not present

## 2022-11-23 DIAGNOSIS — I252 Old myocardial infarction: Secondary | ICD-10-CM | POA: Diagnosis not present

## 2022-11-23 DIAGNOSIS — Z993 Dependence on wheelchair: Secondary | ICD-10-CM | POA: Diagnosis not present

## 2022-11-23 DIAGNOSIS — Z8616 Personal history of COVID-19: Secondary | ICD-10-CM | POA: Diagnosis not present

## 2022-11-23 NOTE — Telephone Encounter (Signed)
Call from [place] SW with [facility] requesting additional SW visit for patient next week.  Need to discuss with patient additional services to assess with forms for obtaining Food Stamps. Verbal Order given for another 1 time visit.  Will forward to PCP for approval or denial.

## 2022-11-24 ENCOUNTER — Other Ambulatory Visit: Payer: Self-pay | Admitting: *Deleted

## 2022-11-24 DIAGNOSIS — C801 Malignant (primary) neoplasm, unspecified: Secondary | ICD-10-CM

## 2022-11-24 DIAGNOSIS — C159 Malignant neoplasm of esophagus, unspecified: Secondary | ICD-10-CM

## 2022-11-25 ENCOUNTER — Other Ambulatory Visit: Payer: Self-pay | Admitting: Oncology

## 2022-11-25 DIAGNOSIS — J439 Emphysema, unspecified: Secondary | ICD-10-CM | POA: Diagnosis not present

## 2022-11-25 DIAGNOSIS — M069 Rheumatoid arthritis, unspecified: Secondary | ICD-10-CM | POA: Diagnosis not present

## 2022-11-25 DIAGNOSIS — J449 Chronic obstructive pulmonary disease, unspecified: Secondary | ICD-10-CM | POA: Diagnosis not present

## 2022-11-25 DIAGNOSIS — I739 Peripheral vascular disease, unspecified: Secondary | ICD-10-CM | POA: Diagnosis not present

## 2022-11-25 DIAGNOSIS — I471 Supraventricular tachycardia, unspecified: Secondary | ICD-10-CM | POA: Diagnosis not present

## 2022-11-27 ENCOUNTER — Telehealth: Payer: Self-pay | Admitting: *Deleted

## 2022-11-27 NOTE — Telephone Encounter (Signed)
Called and lvm for Lindon Romp LPN ( [phone]) regarding PCS forms for this patient. Forms were faxed to [employer] / [employer] for review /scheduling on 11-02-2022.

## 2022-11-28 ENCOUNTER — Telehealth: Payer: Self-pay | Admitting: *Deleted

## 2022-11-28 DIAGNOSIS — J439 Emphysema, unspecified: Secondary | ICD-10-CM | POA: Diagnosis not present

## 2022-11-28 DIAGNOSIS — Z791 Long term (current) use of non-steroidal anti-inflammatories (NSAID): Secondary | ICD-10-CM | POA: Diagnosis not present

## 2022-11-28 DIAGNOSIS — K219 Gastro-esophageal reflux disease without esophagitis: Secondary | ICD-10-CM | POA: Diagnosis not present

## 2022-11-28 DIAGNOSIS — M069 Rheumatoid arthritis, unspecified: Secondary | ICD-10-CM | POA: Diagnosis not present

## 2022-11-28 DIAGNOSIS — J9601 Acute respiratory failure with hypoxia: Secondary | ICD-10-CM | POA: Diagnosis not present

## 2022-11-28 DIAGNOSIS — I2510 Atherosclerotic heart disease of native coronary artery without angina pectoris: Secondary | ICD-10-CM | POA: Diagnosis not present

## 2022-11-28 DIAGNOSIS — Z993 Dependence on wheelchair: Secondary | ICD-10-CM | POA: Diagnosis not present

## 2022-11-28 DIAGNOSIS — J449 Chronic obstructive pulmonary disease, unspecified: Secondary | ICD-10-CM | POA: Diagnosis not present

## 2022-11-28 DIAGNOSIS — I739 Peripheral vascular disease, unspecified: Secondary | ICD-10-CM | POA: Diagnosis not present

## 2022-11-28 DIAGNOSIS — M5137 Other intervertebral disc degeneration, lumbosacral region: Secondary | ICD-10-CM | POA: Diagnosis not present

## 2022-11-28 DIAGNOSIS — I252 Old myocardial infarction: Secondary | ICD-10-CM | POA: Diagnosis not present

## 2022-11-28 DIAGNOSIS — Z9181 History of falling: Secondary | ICD-10-CM | POA: Diagnosis not present

## 2022-11-28 DIAGNOSIS — C771 Secondary and unspecified malignant neoplasm of intrathoracic lymph nodes: Secondary | ICD-10-CM | POA: Diagnosis not present

## 2022-11-28 DIAGNOSIS — I472 Ventricular tachycardia, unspecified: Secondary | ICD-10-CM | POA: Diagnosis not present

## 2022-11-28 DIAGNOSIS — Z89612 Acquired absence of left leg above knee: Secondary | ICD-10-CM | POA: Diagnosis not present

## 2022-11-28 DIAGNOSIS — Z7902 Long term (current) use of antithrombotics/antiplatelets: Secondary | ICD-10-CM | POA: Diagnosis not present

## 2022-11-28 DIAGNOSIS — D630 Anemia in neoplastic disease: Secondary | ICD-10-CM | POA: Diagnosis not present

## 2022-11-28 DIAGNOSIS — Z86718 Personal history of other venous thrombosis and embolism: Secondary | ICD-10-CM | POA: Diagnosis not present

## 2022-11-28 DIAGNOSIS — Z8673 Personal history of transient ischemic attack (TIA), and cerebral infarction without residual deficits: Secondary | ICD-10-CM | POA: Diagnosis not present

## 2022-11-28 DIAGNOSIS — Z8616 Personal history of COVID-19: Secondary | ICD-10-CM | POA: Diagnosis not present

## 2022-11-28 DIAGNOSIS — Z79891 Long term (current) use of opiate analgesic: Secondary | ICD-10-CM | POA: Diagnosis not present

## 2022-11-28 DIAGNOSIS — E43 Unspecified severe protein-calorie malnutrition: Secondary | ICD-10-CM | POA: Diagnosis not present

## 2022-11-28 DIAGNOSIS — I10 Essential (primary) hypertension: Secondary | ICD-10-CM | POA: Diagnosis not present

## 2022-11-28 NOTE — Telephone Encounter (Signed)
[facility] [phone] ) regarding a second PCS form on this patient. PCS has been faxed on 11-02-2022 for [name] [name].

## 2022-11-29 ENCOUNTER — Other Ambulatory Visit: Payer: Self-pay | Admitting: Student

## 2022-11-30 ENCOUNTER — Encounter: Payer: Self-pay | Admitting: *Deleted

## 2022-11-30 ENCOUNTER — Inpatient Hospital Stay (HOSPITAL_BASED_OUTPATIENT_CLINIC_OR_DEPARTMENT_OTHER): Payer: 59 | Admitting: Oncology

## 2022-11-30 ENCOUNTER — Inpatient Hospital Stay: Payer: 59 | Attending: Oncology

## 2022-11-30 ENCOUNTER — Other Ambulatory Visit: Payer: Self-pay | Admitting: *Deleted

## 2022-11-30 ENCOUNTER — Inpatient Hospital Stay: Payer: 59

## 2022-11-30 ENCOUNTER — Other Ambulatory Visit: Payer: Self-pay | Admitting: Student

## 2022-11-30 VITALS — BP 118/78 | HR 74

## 2022-11-30 DIAGNOSIS — Z8674 Personal history of sudden cardiac arrest: Secondary | ICD-10-CM | POA: Insufficient documentation

## 2022-11-30 DIAGNOSIS — Z5111 Encounter for antineoplastic chemotherapy: Secondary | ICD-10-CM | POA: Diagnosis not present

## 2022-11-30 DIAGNOSIS — D509 Iron deficiency anemia, unspecified: Secondary | ICD-10-CM | POA: Insufficient documentation

## 2022-11-30 DIAGNOSIS — C801 Malignant (primary) neoplasm, unspecified: Secondary | ICD-10-CM | POA: Insufficient documentation

## 2022-11-30 DIAGNOSIS — Z79899 Other long term (current) drug therapy: Secondary | ICD-10-CM | POA: Diagnosis not present

## 2022-11-30 DIAGNOSIS — R970 Elevated carcinoembryonic antigen [CEA]: Secondary | ICD-10-CM | POA: Insufficient documentation

## 2022-11-30 DIAGNOSIS — C787 Secondary malignant neoplasm of liver and intrahepatic bile duct: Secondary | ICD-10-CM | POA: Diagnosis not present

## 2022-11-30 DIAGNOSIS — C159 Malignant neoplasm of esophagus, unspecified: Secondary | ICD-10-CM

## 2022-11-30 DIAGNOSIS — M0579 Rheumatoid arthritis with rheumatoid factor of multiple sites without organ or systems involvement: Secondary | ICD-10-CM

## 2022-11-30 DIAGNOSIS — R911 Solitary pulmonary nodule: Secondary | ICD-10-CM | POA: Diagnosis not present

## 2022-11-30 DIAGNOSIS — I2699 Other pulmonary embolism without acute cor pulmonale: Secondary | ICD-10-CM

## 2022-11-30 DIAGNOSIS — D696 Thrombocytopenia, unspecified: Secondary | ICD-10-CM | POA: Insufficient documentation

## 2022-11-30 DIAGNOSIS — R590 Localized enlarged lymph nodes: Secondary | ICD-10-CM | POA: Diagnosis not present

## 2022-11-30 LAB — CBC WITH DIFFERENTIAL (CANCER CENTER ONLY)
Abs Immature Granulocytes: 0 10*3/uL (ref 0.00–0.07)
Basophils Absolute: 0.1 10*3/uL (ref 0.0–0.1)
Basophils Relative: 1 %
Eosinophils Absolute: 0.2 10*3/uL (ref 0.0–0.5)
Eosinophils Relative: 4 %
HCT: 36.6 % — ABNORMAL LOW (ref 39.0–52.0)
Hemoglobin: 11.6 g/dL — ABNORMAL LOW (ref 13.0–17.0)
Immature Granulocytes: 0 %
Lymphocytes Relative: 27 %
Lymphs Abs: 1.1 10*3/uL (ref 0.7–4.0)
MCH: 28.7 pg (ref 26.0–34.0)
MCHC: 31.7 g/dL (ref 30.0–36.0)
MCV: 90.6 fL (ref 80.0–100.0)
Monocytes Absolute: 0.5 10*3/uL (ref 0.1–1.0)
Monocytes Relative: 12 %
Neutro Abs: 2.1 10*3/uL (ref 1.7–7.7)
Neutrophils Relative %: 56 %
Platelet Count: 132 10*3/uL — ABNORMAL LOW (ref 150–400)
RBC: 4.04 MIL/uL — ABNORMAL LOW (ref 4.22–5.81)
RDW: 17.2 % — ABNORMAL HIGH (ref 11.5–15.5)
WBC Count: 3.9 10*3/uL — ABNORMAL LOW (ref 4.0–10.5)
nRBC: 0 % (ref 0.0–0.2)

## 2022-11-30 LAB — CMP (CANCER CENTER ONLY)
ALT: 20 U/L (ref 0–44)
AST: 22 U/L (ref 15–41)
Albumin: 3 g/dL — ABNORMAL LOW (ref 3.5–5.0)
Alkaline Phosphatase: 246 U/L — ABNORMAL HIGH (ref 38–126)
Anion gap: 8 (ref 5–15)
BUN: 14 mg/dL (ref 8–23)
CO2: 22 mmol/L (ref 22–32)
Calcium: 8.7 mg/dL — ABNORMAL LOW (ref 8.9–10.3)
Chloride: 106 mmol/L (ref 98–111)
Creatinine: 0.83 mg/dL (ref 0.61–1.24)
GFR, Estimated: >60 mL/min (ref >60)
Glucose, Bld: 119 mg/dL — ABNORMAL HIGH (ref 70–99)
Potassium: 3.7 mmol/L (ref 3.5–5.1)
Sodium: 136 mmol/L (ref 135–145)
Total Bilirubin: 0.5 mg/dL (ref 0.3–1.2)
Total Protein: 6.1 g/dL — ABNORMAL LOW (ref 6.5–8.1)

## 2022-11-30 MED ORDER — FAMOTIDINE IN NACL 20-0.9 MG/50ML-% IV SOLN
20.0000 mg | Freq: Once | INTRAVENOUS | Status: AC
  Administered 2022-11-30: 20 mg via INTRAVENOUS
  Filled 2022-11-30: qty 50

## 2022-11-30 MED ORDER — SODIUM CHLORIDE 0.9 % IV SOLN: Freq: Once | INTRAVENOUS | Status: AC

## 2022-11-30 MED ORDER — HEPARIN SOD (PORK) LOCK FLUSH 100 UNIT/ML IV SOLN
500.0000 [IU] | Freq: Once | INTRAVENOUS | Status: AC | PRN
  Administered 2022-11-30: 500 [IU]

## 2022-11-30 MED ORDER — SODIUM CHLORIDE 0.9 % IV SOLN
8.0000 mg/kg | Freq: Once | INTRAVENOUS | Status: AC
  Administered 2022-11-30: 500 mg via INTRAVENOUS
  Filled 2022-11-30: qty 50

## 2022-11-30 MED ORDER — PALONOSETRON HCL INJECTION 0.25 MG/5ML
0.2500 mg | Freq: Once | INTRAVENOUS | Status: AC
  Administered 2022-11-30: 0.25 mg via INTRAVENOUS
  Filled 2022-11-30: qty 5

## 2022-11-30 MED ORDER — DIPHENHYDRAMINE HCL 50 MG/ML IJ SOLN
50.0000 mg | Freq: Once | INTRAMUSCULAR | Status: AC
  Administered 2022-11-30: 50 mg via INTRAVENOUS
  Filled 2022-11-30: qty 1

## 2022-11-30 MED ORDER — SODIUM CHLORIDE 0.9 % IV SOLN
60.0000 mg/m2 | Freq: Once | INTRAVENOUS | Status: AC
  Administered 2022-11-30: 102 mg via INTRAVENOUS
  Filled 2022-11-30: qty 17

## 2022-11-30 MED ORDER — SODIUM CHLORIDE 0.9% FLUSH
10.0000 mL | INTRAVENOUS | Status: DC | PRN
  Administered 2022-11-30: 10 mL

## 2022-11-30 MED ORDER — FERROUS SULFATE 325 (65 FE) MG PO TABS: 325.0000 mg | ORAL_TABLET | Freq: Every day | ORAL | 0 refills | Status: DC

## 2022-11-30 MED ORDER — APIXABAN 5 MG PO TABS
5.0000 mg | ORAL_TABLET | Freq: Two times a day (BID) | ORAL | 0 refills | Status: DC
Start: 2022-11-30 — End: 2023-01-22

## 2022-11-30 MED ORDER — SODIUM CHLORIDE 0.9 % IV SOLN
10.0000 mg | Freq: Once | INTRAVENOUS | Status: AC
  Administered 2022-11-30: 10 mg via INTRAVENOUS
  Filled 2022-11-30: qty 10

## 2022-11-30 MED ORDER — MELOXICAM 7.5 MG PO TABS
7.5000 mg | ORAL_TABLET | Freq: Every day | ORAL | 3 refills | Status: AC | PRN
Start: 2022-11-30 — End: 2023-03-30

## 2022-11-30 MED ORDER — ALBUTEROL SULFATE HFA 108 (90 BASE) MCG/ACT IN AERS: 2.0000 | INHALATION_SPRAY | Freq: Four times a day (QID) | RESPIRATORY_TRACT | 2 refills | Status: DC | PRN

## 2022-11-30 NOTE — Progress Notes (Signed)
Provided [name] [name] 1 case of vanilla Ensure and navigator provided a bag of food from food pantry.

## 2022-11-30 NOTE — Progress Notes (Signed)
Per MD will check UP on 6/20 dose, OK to treat today without one

## 2022-11-30 NOTE — Telephone Encounter (Signed)
ferrous sulfate 325 (65 FE) MG tablet   apixaban (ELIQUIS) 5 MG TABS tablet   meloxicam (MOBIC) 7.5 MG tablet    [facility] - [place], [place] - [address]

## 2022-11-30 NOTE — Progress Notes (Signed)
[facility] OFFICE PROGRESS NOTE   Diagnosis: Gastroesophageal cancer  INTERVAL HISTORY:   Mr. Seja completed another treatment with Taxol/ramucirumab on 11/16/2022.  No rash following this cycle of chemotherapy.  He complains of malaise.  His hands feel "cold ".  He has a history of numbness in the hands dating back at least 7-8 months.  The numbness is progressive.  He reports being unable to tie his shoes for greater than a year.  He can hold a fork.  He cannot button his shirt.  Objective:  Vital signs in last 24 hours:  Blood pressure 99/75, pulse 76, temperature 98.1 F (36.7 C), temperature source Oral, resp. rate 18, height 5\' 8"  (1.727 m), weight 136 lb (61.7 kg), SpO2 99 %.    HEENT: No thrush or ulcers Resp: Distant breath sounds, no respiratory distress Cardio: Regular rhythm, distant heart sounds GI: Nontender, no hepatosplenomegaly Vascular: No right leg edema Neuro: Moderate-severe loss of vibratory sense at the fingertips bilaterally   Portacath/PICC-without erythema  Lab Results:  Lab Results  Component Value Date   WBC 3.9 (L) 11/30/2022   HGB 11.6 (L) 11/30/2022   HCT 36.6 (L) 11/30/2022   MCV 90.6 11/30/2022   PLT 132 (L) 11/30/2022   NEUTROABS 2.1 11/30/2022    CMP  Lab Results  Component Value Date   NA 138 11/16/2022   K 4.1 11/16/2022   CL 108 11/16/2022   CO2 23 11/16/2022   GLUCOSE 104 (H) 11/16/2022   BUN 17 11/16/2022   CREATININE 0.89 11/16/2022   CALCIUM 8.7 (L) 11/16/2022   PROT 6.5 11/16/2022   ALBUMIN 3.3 (L) 11/16/2022   AST 24 11/16/2022   ALT 17 11/16/2022   ALKPHOS 150 (H) 11/16/2022   BILITOT 0.4 11/16/2022   GFRNONAA >60 11/16/2022   GFRAA >60 01/24/2020    Lab Results  Component Value Date   CEA1 287.0 (H) 02/11/2022   CEA 227.42 (H) 11/16/2022   ZOX096 88 (H) 02/11/2022     Medications: I have reviewed the patient's current medications.   Assessment/Plan:  Metastatic adenocarcinoma,  likely esophagus primary - Liver mass, mass or lymph node conglomerate near the GE junction, gastrohepatic ligament and retroperitoneal lymphadenopathy. -02/11/2022 CTA chest-7.8 x 8.5 cm subtle hypoattenuating mass lesion in segment IV of the liver. -02/11/2022 CT abdomen/pelvis-8.7 x 7.8 or ill-defined irregular mass in the medial segment left liver, 4.5 x 3.4 cm necrotic mass lesion just cranial to the esophagogastric junction with metastatic lymphadenopathy in the abdomen and pelvis, irregular nodular peritoneal thickening in the lower abdomen and pelvis -02/11/2022 MRI abdomen-rim-enhancing likely necrotic mass or lymph node conglomerate centered around the gastroesophageal junction measuring 5.0 x 3.9 cm, large rim-enhancing likely necrotic liver mass centered in the anterior left lobe of the liver measuring 7.8 x 7.2 cm, enlarged gastrohepatic ligament and retroperitoneal lymph nodes -Labs from 02/11/2022-AFP 2.4, CA 19.9 88, CEA 287 -Ultrasound-guided liver biopsy performed 02/15/2022-adenocarcinoma with extensive necrosis, CK7 positive, negative for CK20, CDX2, TTF-1, and PAX8, consistent with a pancreatic, lung, or upper gastrointestinal primary -PET 03/08/2022-hypermetabolic distal esophagus mass, hypermetabolic centrally necrotic hepatic mass, hypermetabolic periportal nodes, exophytic GE junction mass without hypermetabolism-likely benign lesion, no hypermetabolism in the pancreas head, bilateral hypermetabolic adrenal gland lesions, small hypermetabolic left parotid gland lesion.  No loss of mismatch repair protein expression, HER2 3+ -CTs 03/11/2022-no change from PET 03/08/2022, known distal esophagus mass-not visualized on unenhanced CT, large hepatic metastasis and upper abdominal nodal metastases, chronic pancreatitis with 4.3 cm pseudocyst  in the pancreas head -Cycle 1 FOLFOX, trastuzumab, Pembrolizumab 04/06/2022 -Cycle 2 oxaliplatin, 5-FU bolus/pump and trastuzumab held due to hospitalization  following cycle 1 with possible coronary vasospasm -Pembrolizumab 04/25/2022 -CT abdomen/pelvis 05/02/2022-increased peripancreatic edema, increased size of pancreas head pseudocyst, increased necrotic nodal masses at the GE junction -Cycle 3 oxaliplatin, trastuzumab, pembrolizumab 05/15/2022; 5-FU held secondary to cardiac toxicity following cycle 1 -Cycle 4 oxaliplatin, trastuzumab 05/29/2022, 5-FU held secondary to cardiac toxicity -Cycle 5 oxaliplatin, trastuzumab, Pembrolizumab (6-week dosing for Pembrolizumab) 06/12/2022, 5-FU held secondary to cardiac toxicity -Cycle 6 oxaliplatin, trastuzumab 06/28/2022, 5-FU held -Cycle 7 oxaliplatin, trastuzumab 07/17/2022, 5-FU held -CTs 07/19/2022-decrease in the dominant anterior liver mass, 2 new small liver lesions compared to 05/02/2022, new right lower lobe nodule, enlargement of epicardial lymph nodes, slight decrease in size of celiac axis, gastropathic ligament, and portacaval lymphadenopathy, unchanged esophageal thickening -Cycle 8 oxaliplatin and pembrolizumab 08/14/2022, 5-FU and trastuzumab held secondary to potential cardiac toxicity -CTs 09/12/2022-preliminary review consistent with disease progression -Cycle 1 Taxol/cyramza 09/20/2022 -Cycle 2 Taxol/Cyramza 10/04/2022 -Cycle 3 Taxol/Cyramza 10/19/2022 (Taxol dose reduced secondary to thrombocytopenia) -Cycle 4 Taxol/cyramza 11/16/2022 -Cycle 5 Taxol/Cyramza 11/30/2022   2.  Myocardial infarction/cardiac arrest 01/31/2022 -Echocardiogram 02/01/2022-LVEF 60-65%, right ventricle severely dilated with hypokinesis in the basal and midportion, hypokinesis in the apical portion, right ventricular systolic function moderately reduced -Echocardiogram on 05/01/2022, LVEF 50-55%, decreased right ventricular systolic function, moderately enlarged right ventricle -Echocardiogram 08/05/2022-LVEF 45-50%, mildly decreased left ventricular function, moderate reduced right ventricular function 3.  Microcytic anemia -Labs  from 02/11/2022-ferritin 151, iron 13, percent saturation 5% 4.  DVT/pulmonary embolism-bilateral lower extremity DVTs 01/10/2022, pulmonary emboli on chest CT 01/31/2022-apixaban 5.  PAD status post AKA in 2021 6.  RA 7.  Bilateral pleural effusions -Ultrasound-guided thoracentesis performed on 02/11/2022.  Cytology negative for malignancy. 8.  COPD 9.  Admission 03/11/2022 with E. coli bacteremia Ultrasound-guided biopsy/aspiration of dominant necrotic appearing liver lesion 03/17/2022-scant fluid aspirated-negative culture, no organisms or white cells.  Pathology with necrotic tissue. 10.  Admission 04/29/2022 after a fall, multiple skull fractures 11.  Fever, hypotension-Klebsiella bacteremia on blood culture 04/30/2022 12.  Admission 04/09/2022 with chest pain, NSTEMI 13.  Admission 08/04/2022 with ventricular tachycardia, status post cardioversion, noted to have hypokalemia and hypomagnesemia, started on amiodarone 14.  Rash following cycle 3 Taxol/ramucirumab-admitted to [facility] due to concern for SJS.  SJS and disseminated zoster ruled out.    Disposition: Mr. Giuliani appears stable.  He has completed 4 treatments with Taxol/Cyramza.  He did not have a rash following the last cycle of chemotherapy.  It appears the rash she experienced last month was unrelated to chemotherapy.  The CEA was lower when he was here 2 weeks ago.  He has peripheral neuropathy symptoms.  This predated Taxol.  I recommend continuing Taxol for at least 1-2 more cycles.  We will plan for restaging CT after treatment on 12/14/2022.  Thornton Papas, MD  11/30/2022  11:12 AM

## 2022-11-30 NOTE — Patient Instructions (Signed)
[facility]   Discharge Instructions: Thank you for choosing [facility] to provide your oncology and hematology care.   If you have a lab appointment with the Cancer Center, please go directly to the Cancer Center and check in at the registration area.   Wear comfortable clothing and clothing appropriate for easy access to any Portacath or PICC line.   We strive to give you quality time with your provider. You may need to reschedule your appointment if you arrive late (15 or more minutes).  Arriving late affects you and other patients whose appointments are after yours.  Also, if you miss three or more appointments without notifying the office, you may be dismissed from the clinic at the provider's discretion.      For prescription refill requests, have your pharmacy contact our office and allow 72 hours for refills to be completed.    Today you received the following chemotherapy and/or immunotherapy agents Cyramza/Taxol      To help prevent nausea and vomiting after your treatment, we encourage you to take your nausea medication as directed.  BELOW ARE SYMPTOMS THAT SHOULD BE REPORTED IMMEDIATELY: *FEVER GREATER THAN 100.4 F (38 C) OR HIGHER *CHILLS OR SWEATING *NAUSEA AND VOMITING THAT IS NOT CONTROLLED WITH YOUR NAUSEA MEDICATION *UNUSUAL SHORTNESS OF BREATH *UNUSUAL BRUISING OR BLEEDING *URINARY PROBLEMS (pain or burning when urinating, or frequent urination) *BOWEL PROBLEMS (unusual diarrhea, constipation, pain near the anus) TENDERNESS IN MOUTH AND THROAT WITH OR WITHOUT PRESENCE OF ULCERS (sore throat, sores in mouth, or a toothache) UNUSUAL RASH, SWELLING OR PAIN  UNUSUAL VAGINAL DISCHARGE OR ITCHING   Items with * indicate a potential emergency and should be followed up as soon as possible or go to the Emergency Department if any problems should occur.  Please show the CHEMOTHERAPY ALERT CARD or IMMUNOTHERAPY ALERT CARD at  check-in to the Emergency Department and triage nurse.  Should you have questions after your visit or need to cancel or reschedule your appointment, please contact [facility]  Dept: [phone]  and follow the prompts.  Office hours are 8:00 a.m. to 4:30 p.m. Monday - Friday. Please note that voicemails left after 4:00 p.m. may not be returned until the following business day.  We are closed weekends and major holidays. You have access to a nurse at all times for urgent questions. Please call the main number to the clinic Dept: [phone] and follow the prompts.   For any non-urgent questions, you may also contact your provider using MyChart. We now offer e-Visits for anyone 24 and older to request care online for non-urgent symptoms. For details visit [email or web address].   Also download the MyChart app! Go to the app store, search "MyChart", open the app, select [facility] [place], and log in with your MyChart username and password.  Ramucirumab Injection What is this medication? RAMUCIRUMAB (ra mue SIR ue mab) treats some types of cancer. It works by blocking a protein that causes cancer cells to grow and multiply. This helps to slow or stop the spread of cancer cells. It is a monoclonal antibody. This medicine may be used for other purposes; ask your health care provider or pharmacist if you have questions. COMMON BRAND NAME(S): Cyramza What should I tell my care team before I take this medication? They need to know if you have any of these conditions: Blood clots Having or recent surgery Heart attack High blood pressure History of a  tear in your stomach or intestines Liver disease Protein in your urine Stomach bleeding Stroke Thyroid disease An unusual or allergic reaction to ramucirumab, other medications, foods, dyes, or preservatives Pregnant or trying to get pregnant Breast-feeding How should I use this medication? This medication is injected  into a vein. It is given by your care team in a hospital or clinic setting. Talk to your care team about the use of this medication in children. Special care may be needed. Overdosage: If you think you have taken too much of this medicine contact a poison control center or emergency room at once. NOTE: This medicine is only for you. Do not share this medicine with others. What if I miss a dose? Keep appointments for follow-up doses. It is important not to miss your dose. Call your care team if you are unable to keep an appointment. What may interact with this medication? Interactions have not been studied. This list may not describe all possible interactions. Give your health care provider a list of all the medicines, herbs, non-prescription drugs, or dietary supplements you use. Also tell them if you smoke, drink alcohol, or use illegal drugs. Some items may interact with your medicine. What should I watch for while using this medication? Your condition will be monitored carefully while you are receiving this medication. You may need blood work while taking this medication. This medication may make you feel generally unwell. This is not uncommon as chemotherapy can affect health cells as well as cancer cells. Report any side effects. Continue your course of treatment even though you feel ill unless your care team tells you to stop. This medication may increase your risk to bruise or bleed. Call your care team if you notice any unusual bleeding. Before having surgery, talk to your care team to make sure it is ok. This medication can increase the risk of poor healing of your surgical site or wound. You will need to stop this medication for 28 days before surgery. After surgery, wait at least 2 weeks before restarting this medication. Make sure the surgical site or wound is healed enough before restarting this medication. Talk to your care team if questions. Talk to your care team if you may be pregnant.  Serious birth defects can occur if you take this medication during pregnancy and for 3 months after the last dose. You will need a negative pregnancy test before starting this medication. Contraception is recommended while taking this medication and for 3 months after the last dose. Your care team can help you find the option that works for you. Do not breastfeed while taking this medication and for 2 months after the last dose. This medication may cause infertility. Talk to your care team if you are concerned about your fertility. What side effects may I notice from receiving this medication? Side effects that you should report to your care team as soon as possible: Allergic reactions--skin rash, itching, hives, swelling of the face, lips, tongue, or throat Bleeding--bloody or black, tar-like stools, vomiting blood or brown material that looks like coffee grounds, red or dark brown urine, small red or purple spots on skin, unusual bruising or bleeding Dizziness, loss of balance or coordination, confusion or trouble speaking Heart attack--pain or tightness in the chest, shoulders, arms, or jaw, nausea, shortness of breath, cold or clammy skin, feeling faint or lightheaded Increase in blood pressure Infection--fever, chills, cough, sore throat, wounds that don't heal, pain or trouble when passing urine, general feeling of discomfort  or being unwell Infusion reactions--chest pain, shortness of breath or trouble breathing, feeling faint or lightheaded Kidney injury--decrease in the amount of urine, swelling of the ankles, hands, or feet Liver injury--right upper belly pain, loss of appetite, nausea, light-colored stool, dark yellow or brown urine, yellowing skin or eyes, unusual weakness or fatigue Low thyroid levels (hypothyroidism)--unusual weakness or fatigue, increased sensitivity to cold, constipation, hair loss, dry skin, weight gain, feelings of depression Stomach pain that is severe, does not go  away, or gets worse Stroke--sudden numbness or weakness of the face, arm, or leg, trouble speaking, confusion, trouble walking, loss of balance or coordination, dizziness, severe headache, change in vision Sudden and severe headache, confusion, change in vision, seizures, which may be signs of posterior reversible encephalopathy syndrome (PRES) Side effects that usually do not require medical attention (report to your care team if they continue or are bothersome): Diarrhea Fatigue Stomach pain Swelling of the ankles, hands, or feet This list may not describe all possible side effects. Call your doctor for medical advice about side effects. You may report side effects to FDA at 1-800-FDA-1088. Where should I keep my medication? This medication is given in a hospital or clinic. It will not be stored at home. NOTE: This sheet is a summary. It may not cover all possible information. If you have questions about this medicine, talk to your doctor, pharmacist, or health care provider.  2024 Elsevier/Gold Standard (2021-11-03 00:00:00)  Paclitaxel Injection What is this medication? PACLITAXEL (PAK li TAX el) treats some types of cancer. It works by slowing down the growth of cancer cells. This medicine may be used for other purposes; ask your health care provider or pharmacist if you have questions. COMMON BRAND NAME(S): Onxol, Taxol What should I tell my care team before I take this medication? They need to know if you have any of these conditions: Heart disease Liver disease Low white blood cell levels An unusual or allergic reaction to paclitaxel, other medications, foods, dyes, or preservatives If you or your partner are pregnant or trying to get pregnant Breast-feeding How should I use this medication? This medication is injected into a vein. It is given by your care team in a hospital or clinic setting. Talk to your care team about the use of this medication in children. While it may be  given to children for selected conditions, precautions do apply. Overdosage: If you think you have taken too much of this medicine contact a poison control center or emergency room at once. NOTE: This medicine is only for you. Do not share this medicine with others. What if I miss a dose? Keep appointments for follow-up doses. It is important not to miss your dose. Call your care team if you are unable to keep an appointment. What may interact with this medication? Do not take this medication with any of the following: Live virus vaccines Other medications may affect the way this medication works. Talk with your care team about all of the medications you take. They may suggest changes to your treatment plan to lower the risk of side effects and to make sure your medications work as intended. This list may not describe all possible interactions. Give your health care provider a list of all the medicines, herbs, non-prescription drugs, or dietary supplements you use. Also tell them if you smoke, drink alcohol, or use illegal drugs. Some items may interact with your medicine. What should I watch for while using this medication? Your condition will be  monitored carefully while you are receiving this medication. You may need blood work while taking this medication. This medication may make you feel generally unwell. This is not uncommon as chemotherapy can affect healthy cells as well as cancer cells. Report any side effects. Continue your course of treatment even though you feel ill unless your care team tells you to stop. This medication can cause serious allergic reactions. To reduce the risk, your care team may give you other medications to take before receiving this one. Be sure to follow the directions from your care team. This medication may increase your risk of getting an infection. Call your care team for advice if you get a fever, chills, sore throat, or other symptoms of a cold or flu. Do not  treat yourself. Try to avoid being around people who are sick. This medication may increase your risk to bruise or bleed. Call your care team if you notice any unusual bleeding. Be careful brushing or flossing your teeth or using a toothpick because you may get an infection or bleed more easily. If you have any dental work done, tell your dentist you are receiving this medication. Talk to your care team if you may be pregnant. Serious birth defects can occur if you take this medication during pregnancy. Talk to your care team before breastfeeding. Changes to your treatment plan may be needed. What side effects may I notice from receiving this medication? Side effects that you should report to your care team as soon as possible: Allergic reactions--skin rash, itching, hives, swelling of the face, lips, tongue, or throat Heart rhythm changes--fast or irregular heartbeat, dizziness, feeling faint or lightheaded, chest pain, trouble breathing Increase in blood pressure Infection--fever, chills, cough, sore throat, wounds that don't heal, pain or trouble when passing urine, general feeling of discomfort or being unwell Low blood pressure--dizziness, feeling faint or lightheaded, blurry vision Low red blood cell level--unusual weakness or fatigue, dizziness, headache, trouble breathing Painful swelling, warmth, or redness of the skin, blisters or sores at the infusion site Pain, tingling, or numbness in the hands or feet Slow heartbeat--dizziness, feeling faint or lightheaded, confusion, trouble breathing, unusual weakness or fatigue Unusual bruising or bleeding Side effects that usually do not require medical attention (report to your care team if they continue or are bothersome): Diarrhea Hair loss Joint pain Loss of appetite Muscle pain Nausea Vomiting This list may not describe all possible side effects. Call your doctor for medical advice about side effects. You may report side effects to FDA  at 1-800-FDA-1088. Where should I keep my medication? This medication is given in a hospital or clinic. It will not be stored at home. NOTE: This sheet is a summary. It may not cover all possible information. If you have questions about this medicine, talk to your doctor, pharmacist, or health care provider.  2024 Elsevier/Gold Standard (2021-11-01 00:00:00)

## 2022-11-30 NOTE — Progress Notes (Signed)
Patient seen by Dr. Sherrill today ? ?Vitals are within treatment parameters. ? ?Labs reviewed by Dr. Sherrill and are within treatment parameters. ? ?Per physician team, patient is ready for treatment and there are NO modifications to the treatment plan.  ?

## 2022-12-01 DIAGNOSIS — Z9181 History of falling: Secondary | ICD-10-CM | POA: Diagnosis not present

## 2022-12-01 DIAGNOSIS — J439 Emphysema, unspecified: Secondary | ICD-10-CM | POA: Diagnosis not present

## 2022-12-01 DIAGNOSIS — I739 Peripheral vascular disease, unspecified: Secondary | ICD-10-CM | POA: Diagnosis not present

## 2022-12-01 DIAGNOSIS — D630 Anemia in neoplastic disease: Secondary | ICD-10-CM | POA: Diagnosis not present

## 2022-12-01 DIAGNOSIS — I2510 Atherosclerotic heart disease of native coronary artery without angina pectoris: Secondary | ICD-10-CM | POA: Diagnosis not present

## 2022-12-01 DIAGNOSIS — M5137 Other intervertebral disc degeneration, lumbosacral region: Secondary | ICD-10-CM | POA: Diagnosis not present

## 2022-12-01 DIAGNOSIS — Z89612 Acquired absence of left leg above knee: Secondary | ICD-10-CM | POA: Diagnosis not present

## 2022-12-01 DIAGNOSIS — K219 Gastro-esophageal reflux disease without esophagitis: Secondary | ICD-10-CM | POA: Diagnosis not present

## 2022-12-01 DIAGNOSIS — I472 Ventricular tachycardia, unspecified: Secondary | ICD-10-CM | POA: Diagnosis not present

## 2022-12-01 DIAGNOSIS — E43 Unspecified severe protein-calorie malnutrition: Secondary | ICD-10-CM | POA: Diagnosis not present

## 2022-12-01 DIAGNOSIS — C771 Secondary and unspecified malignant neoplasm of intrathoracic lymph nodes: Secondary | ICD-10-CM | POA: Diagnosis not present

## 2022-12-01 DIAGNOSIS — Z8673 Personal history of transient ischemic attack (TIA), and cerebral infarction without residual deficits: Secondary | ICD-10-CM | POA: Diagnosis not present

## 2022-12-01 DIAGNOSIS — Z79891 Long term (current) use of opiate analgesic: Secondary | ICD-10-CM | POA: Diagnosis not present

## 2022-12-01 DIAGNOSIS — Z993 Dependence on wheelchair: Secondary | ICD-10-CM | POA: Diagnosis not present

## 2022-12-01 DIAGNOSIS — Z8616 Personal history of COVID-19: Secondary | ICD-10-CM | POA: Diagnosis not present

## 2022-12-01 DIAGNOSIS — I252 Old myocardial infarction: Secondary | ICD-10-CM | POA: Diagnosis not present

## 2022-12-01 DIAGNOSIS — Z7902 Long term (current) use of antithrombotics/antiplatelets: Secondary | ICD-10-CM | POA: Diagnosis not present

## 2022-12-01 DIAGNOSIS — J9601 Acute respiratory failure with hypoxia: Secondary | ICD-10-CM | POA: Diagnosis not present

## 2022-12-01 DIAGNOSIS — I10 Essential (primary) hypertension: Secondary | ICD-10-CM | POA: Diagnosis not present

## 2022-12-01 DIAGNOSIS — M069 Rheumatoid arthritis, unspecified: Secondary | ICD-10-CM | POA: Diagnosis not present

## 2022-12-01 DIAGNOSIS — J449 Chronic obstructive pulmonary disease, unspecified: Secondary | ICD-10-CM | POA: Diagnosis not present

## 2022-12-01 DIAGNOSIS — Z86718 Personal history of other venous thrombosis and embolism: Secondary | ICD-10-CM | POA: Diagnosis not present

## 2022-12-01 DIAGNOSIS — Z791 Long term (current) use of non-steroidal anti-inflammatories (NSAID): Secondary | ICD-10-CM | POA: Diagnosis not present

## 2022-12-04 ENCOUNTER — Telehealth: Payer: Self-pay | Admitting: *Deleted

## 2022-12-04 NOTE — Telephone Encounter (Signed)
Spoke with patient regarding his request for PCS, information was faxed 11-02-2022. Patient and his care taker states they have not heard from [facility] [phone]). I spoke with [facility], office has been trying to get inc contact  with this patent to set up appointment.  Contact information for [facility] was given to patient for them to contact patient to set up appintment

## 2022-12-07 DIAGNOSIS — E43 Unspecified severe protein-calorie malnutrition: Secondary | ICD-10-CM | POA: Diagnosis not present

## 2022-12-07 DIAGNOSIS — D630 Anemia in neoplastic disease: Secondary | ICD-10-CM | POA: Diagnosis not present

## 2022-12-07 DIAGNOSIS — Z9181 History of falling: Secondary | ICD-10-CM | POA: Diagnosis not present

## 2022-12-07 DIAGNOSIS — M069 Rheumatoid arthritis, unspecified: Secondary | ICD-10-CM | POA: Diagnosis not present

## 2022-12-07 DIAGNOSIS — I739 Peripheral vascular disease, unspecified: Secondary | ICD-10-CM | POA: Diagnosis not present

## 2022-12-07 DIAGNOSIS — Z89612 Acquired absence of left leg above knee: Secondary | ICD-10-CM | POA: Diagnosis not present

## 2022-12-07 DIAGNOSIS — J9601 Acute respiratory failure with hypoxia: Secondary | ICD-10-CM | POA: Diagnosis not present

## 2022-12-07 DIAGNOSIS — C771 Secondary and unspecified malignant neoplasm of intrathoracic lymph nodes: Secondary | ICD-10-CM | POA: Diagnosis not present

## 2022-12-07 DIAGNOSIS — Z8616 Personal history of COVID-19: Secondary | ICD-10-CM | POA: Diagnosis not present

## 2022-12-07 DIAGNOSIS — I2510 Atherosclerotic heart disease of native coronary artery without angina pectoris: Secondary | ICD-10-CM | POA: Diagnosis not present

## 2022-12-07 DIAGNOSIS — M5137 Other intervertebral disc degeneration, lumbosacral region: Secondary | ICD-10-CM | POA: Diagnosis not present

## 2022-12-07 DIAGNOSIS — Z8673 Personal history of transient ischemic attack (TIA), and cerebral infarction without residual deficits: Secondary | ICD-10-CM | POA: Diagnosis not present

## 2022-12-07 DIAGNOSIS — I10 Essential (primary) hypertension: Secondary | ICD-10-CM | POA: Diagnosis not present

## 2022-12-07 DIAGNOSIS — Z79891 Long term (current) use of opiate analgesic: Secondary | ICD-10-CM | POA: Diagnosis not present

## 2022-12-07 DIAGNOSIS — Z7902 Long term (current) use of antithrombotics/antiplatelets: Secondary | ICD-10-CM | POA: Diagnosis not present

## 2022-12-07 DIAGNOSIS — J439 Emphysema, unspecified: Secondary | ICD-10-CM | POA: Diagnosis not present

## 2022-12-07 DIAGNOSIS — Z86718 Personal history of other venous thrombosis and embolism: Secondary | ICD-10-CM | POA: Diagnosis not present

## 2022-12-07 DIAGNOSIS — I472 Ventricular tachycardia, unspecified: Secondary | ICD-10-CM | POA: Diagnosis not present

## 2022-12-07 DIAGNOSIS — J449 Chronic obstructive pulmonary disease, unspecified: Secondary | ICD-10-CM | POA: Diagnosis not present

## 2022-12-07 DIAGNOSIS — I252 Old myocardial infarction: Secondary | ICD-10-CM | POA: Diagnosis not present

## 2022-12-07 DIAGNOSIS — K219 Gastro-esophageal reflux disease without esophagitis: Secondary | ICD-10-CM | POA: Diagnosis not present

## 2022-12-07 DIAGNOSIS — Z993 Dependence on wheelchair: Secondary | ICD-10-CM | POA: Diagnosis not present

## 2022-12-07 DIAGNOSIS — Z791 Long term (current) use of non-steroidal anti-inflammatories (NSAID): Secondary | ICD-10-CM | POA: Diagnosis not present

## 2022-12-13 DIAGNOSIS — C787 Secondary malignant neoplasm of liver and intrahepatic bile duct: Secondary | ICD-10-CM | POA: Diagnosis not present

## 2022-12-13 DIAGNOSIS — I2510 Atherosclerotic heart disease of native coronary artery without angina pectoris: Secondary | ICD-10-CM | POA: Diagnosis not present

## 2022-12-13 DIAGNOSIS — Z993 Dependence on wheelchair: Secondary | ICD-10-CM | POA: Diagnosis not present

## 2022-12-13 DIAGNOSIS — M069 Rheumatoid arthritis, unspecified: Secondary | ICD-10-CM | POA: Diagnosis not present

## 2022-12-13 DIAGNOSIS — D630 Anemia in neoplastic disease: Secondary | ICD-10-CM | POA: Diagnosis not present

## 2022-12-13 DIAGNOSIS — I739 Peripheral vascular disease, unspecified: Secondary | ICD-10-CM | POA: Diagnosis not present

## 2022-12-13 DIAGNOSIS — F32A Depression, unspecified: Secondary | ICD-10-CM | POA: Diagnosis not present

## 2022-12-13 DIAGNOSIS — R918 Other nonspecific abnormal finding of lung field: Secondary | ICD-10-CM | POA: Diagnosis not present

## 2022-12-13 DIAGNOSIS — Z8673 Personal history of transient ischemic attack (TIA), and cerebral infarction without residual deficits: Secondary | ICD-10-CM | POA: Diagnosis not present

## 2022-12-13 DIAGNOSIS — M1990 Unspecified osteoarthritis, unspecified site: Secondary | ICD-10-CM | POA: Diagnosis not present

## 2022-12-13 DIAGNOSIS — Z791 Long term (current) use of non-steroidal anti-inflammatories (NSAID): Secondary | ICD-10-CM | POA: Diagnosis not present

## 2022-12-13 DIAGNOSIS — J439 Emphysema, unspecified: Secondary | ICD-10-CM | POA: Diagnosis not present

## 2022-12-13 DIAGNOSIS — K219 Gastro-esophageal reflux disease without esophagitis: Secondary | ICD-10-CM | POA: Diagnosis not present

## 2022-12-13 DIAGNOSIS — M5137 Other intervertebral disc degeneration, lumbosacral region: Secondary | ICD-10-CM | POA: Diagnosis not present

## 2022-12-13 DIAGNOSIS — C159 Malignant neoplasm of esophagus, unspecified: Secondary | ICD-10-CM | POA: Diagnosis not present

## 2022-12-13 DIAGNOSIS — G894 Chronic pain syndrome: Secondary | ICD-10-CM | POA: Diagnosis not present

## 2022-12-13 DIAGNOSIS — E785 Hyperlipidemia, unspecified: Secondary | ICD-10-CM | POA: Diagnosis not present

## 2022-12-13 DIAGNOSIS — J449 Chronic obstructive pulmonary disease, unspecified: Secondary | ICD-10-CM | POA: Diagnosis not present

## 2022-12-13 DIAGNOSIS — I493 Ventricular premature depolarization: Secondary | ICD-10-CM | POA: Diagnosis not present

## 2022-12-13 DIAGNOSIS — E43 Unspecified severe protein-calorie malnutrition: Secondary | ICD-10-CM | POA: Diagnosis not present

## 2022-12-13 DIAGNOSIS — G931 Anoxic brain damage, not elsewhere classified: Secondary | ICD-10-CM | POA: Diagnosis not present

## 2022-12-13 DIAGNOSIS — I252 Old myocardial infarction: Secondary | ICD-10-CM | POA: Diagnosis not present

## 2022-12-13 DIAGNOSIS — Z7902 Long term (current) use of antithrombotics/antiplatelets: Secondary | ICD-10-CM | POA: Diagnosis not present

## 2022-12-13 DIAGNOSIS — I10 Essential (primary) hypertension: Secondary | ICD-10-CM | POA: Diagnosis not present

## 2022-12-14 ENCOUNTER — Encounter: Payer: Self-pay | Admitting: Oncology

## 2022-12-14 ENCOUNTER — Telehealth: Payer: Self-pay | Admitting: Oncology

## 2022-12-14 ENCOUNTER — Inpatient Hospital Stay: Payer: 59 | Admitting: Oncology

## 2022-12-14 ENCOUNTER — Inpatient Hospital Stay: Payer: 59

## 2022-12-14 NOTE — Telephone Encounter (Signed)
Spoke with pt's household member regarding pt's appt that was scheduled for today. Pt's household member states that he will relay the message to the pt and have the pt call [facility] to reschedule appts.

## 2022-12-15 ENCOUNTER — Other Ambulatory Visit: Payer: Self-pay | Admitting: Oncology

## 2022-12-15 DIAGNOSIS — M069 Rheumatoid arthritis, unspecified: Secondary | ICD-10-CM | POA: Diagnosis not present

## 2022-12-15 DIAGNOSIS — F17210 Nicotine dependence, cigarettes, uncomplicated: Secondary | ICD-10-CM | POA: Diagnosis not present

## 2022-12-15 DIAGNOSIS — Z79899 Other long term (current) drug therapy: Secondary | ICD-10-CM | POA: Diagnosis not present

## 2022-12-15 DIAGNOSIS — M545 Low back pain, unspecified: Secondary | ICD-10-CM | POA: Diagnosis not present

## 2022-12-17 ENCOUNTER — Emergency Department (HOSPITAL_COMMUNITY)
Admission: EM | Admit: 2022-12-17 | Discharge: 2022-12-17 | Disposition: A | Discharge status: Home / Self Care | Payer: 59 | Attending: Emergency Medicine | Admitting: Emergency Medicine

## 2022-12-17 ENCOUNTER — Emergency Department (HOSPITAL_COMMUNITY): Payer: 59

## 2022-12-17 ENCOUNTER — Encounter (HOSPITAL_COMMUNITY): Payer: Self-pay | Admitting: Emergency Medicine

## 2022-12-17 ENCOUNTER — Other Ambulatory Visit: Payer: Self-pay

## 2022-12-17 DIAGNOSIS — R911 Solitary pulmonary nodule: Secondary | ICD-10-CM | POA: Insufficient documentation

## 2022-12-17 DIAGNOSIS — R531 Weakness: Secondary | ICD-10-CM | POA: Diagnosis not present

## 2022-12-17 DIAGNOSIS — J9811 Atelectasis: Secondary | ICD-10-CM | POA: Diagnosis not present

## 2022-12-17 DIAGNOSIS — R0602 Shortness of breath: Secondary | ICD-10-CM | POA: Diagnosis not present

## 2022-12-17 DIAGNOSIS — C801 Malignant (primary) neoplasm, unspecified: Secondary | ICD-10-CM | POA: Diagnosis not present

## 2022-12-17 DIAGNOSIS — Z5111 Encounter for antineoplastic chemotherapy: Secondary | ICD-10-CM | POA: Diagnosis not present

## 2022-12-17 DIAGNOSIS — R160 Hepatomegaly, not elsewhere classified: Secondary | ICD-10-CM | POA: Diagnosis not present

## 2022-12-17 DIAGNOSIS — Z8505 Personal history of malignant neoplasm of liver: Secondary | ICD-10-CM | POA: Diagnosis not present

## 2022-12-17 DIAGNOSIS — R590 Localized enlarged lymph nodes: Secondary | ICD-10-CM | POA: Diagnosis not present

## 2022-12-17 DIAGNOSIS — R970 Elevated carcinoembryonic antigen [CEA]: Secondary | ICD-10-CM | POA: Diagnosis not present

## 2022-12-17 DIAGNOSIS — J90 Pleural effusion, not elsewhere classified: Secondary | ICD-10-CM | POA: Insufficient documentation

## 2022-12-17 DIAGNOSIS — Z79899 Other long term (current) drug therapy: Secondary | ICD-10-CM | POA: Diagnosis not present

## 2022-12-17 DIAGNOSIS — Z8674 Personal history of sudden cardiac arrest: Secondary | ICD-10-CM | POA: Diagnosis not present

## 2022-12-17 DIAGNOSIS — I499 Cardiac arrhythmia, unspecified: Secondary | ICD-10-CM | POA: Diagnosis not present

## 2022-12-17 DIAGNOSIS — R918 Other nonspecific abnormal finding of lung field: Secondary | ICD-10-CM | POA: Diagnosis not present

## 2022-12-17 DIAGNOSIS — C787 Secondary malignant neoplasm of liver and intrahepatic bile duct: Secondary | ICD-10-CM | POA: Diagnosis not present

## 2022-12-17 DIAGNOSIS — D696 Thrombocytopenia, unspecified: Secondary | ICD-10-CM | POA: Diagnosis not present

## 2022-12-17 DIAGNOSIS — D509 Iron deficiency anemia, unspecified: Secondary | ICD-10-CM | POA: Diagnosis not present

## 2022-12-17 LAB — CBC WITH DIFFERENTIAL/PLATELET
Abs Immature Granulocytes: 0.01 10*3/uL (ref 0.00–0.07)
Basophils Absolute: 0 10*3/uL (ref 0.0–0.1)
Basophils Relative: 1 %
Eosinophils Absolute: 0.2 10*3/uL (ref 0.0–0.5)
Eosinophils Relative: 4 %
HCT: 34 % — ABNORMAL LOW (ref 39.0–52.0)
Hemoglobin: 10.4 g/dL — ABNORMAL LOW (ref 13.0–17.0)
Immature Granulocytes: 0 %
Lymphocytes Relative: 23 %
Lymphs Abs: 1 10*3/uL (ref 0.7–4.0)
MCH: 28 pg (ref 26.0–34.0)
MCHC: 30.6 g/dL (ref 30.0–36.0)
MCV: 91.6 fL (ref 80.0–100.0)
Monocytes Absolute: 0.5 10*3/uL (ref 0.1–1.0)
Monocytes Relative: 12 %
Neutro Abs: 2.6 10*3/uL (ref 1.7–7.7)
Neutrophils Relative %: 60 %
Platelets: 110 10*3/uL — ABNORMAL LOW (ref 150–400)
RBC: 3.71 MIL/uL — ABNORMAL LOW (ref 4.22–5.81)
RDW: 17.7 % — ABNORMAL HIGH (ref 11.5–15.5)
WBC: 4.3 10*3/uL (ref 4.0–10.5)
nRBC: 0 % (ref 0.0–0.2)

## 2022-12-17 LAB — TROPONIN I (HIGH SENSITIVITY): Troponin I (High Sensitivity): 5 ng/L (ref <18)

## 2022-12-17 LAB — BASIC METABOLIC PANEL
Anion gap: 6 (ref 5–15)
BUN: 23 mg/dL (ref 8–23)
CO2: 20 mmol/L — ABNORMAL LOW (ref 22–32)
Calcium: 8 mg/dL — ABNORMAL LOW (ref 8.9–10.3)
Chloride: 111 mmol/L (ref 98–111)
Creatinine, Ser: 1.24 mg/dL (ref 0.61–1.24)
GFR, Estimated: >60 mL/min (ref >60)
Glucose, Bld: 148 mg/dL — ABNORMAL HIGH (ref 70–99)
Potassium: 4.4 mmol/L (ref 3.5–5.1)
Sodium: 137 mmol/L (ref 135–145)

## 2022-12-17 MED ORDER — IOHEXOL 350 MG/ML SOLN
75.0000 mL | Freq: Once | INTRAVENOUS | Status: AC | PRN
  Administered 2022-12-17: 75 mL via INTRAVENOUS

## 2022-12-17 NOTE — Discharge Instructions (Addendum)
As discussed, you have bilateral pleural effusions and a new nodule on your lung. The effusions can resolve on their own.  Follow up with your oncologist at your scheduled appointment this upcoming Wednesday. Call them tomorrow and call them and let them know you were in the ED today.  Return to the ED if: your shortness of breath and/or chest pain worsens or persists, or you develop a new cough.

## 2022-12-17 NOTE — ED Triage Notes (Signed)
Pt BIB EMS from home, c/o SOB and generalized weakness x 3 days. History of liver cancer, chemo tx at cancer center. Diminished lung sounds bilaterally. Loss of appetite, unable to eat/drink.   SBP 92 P 86 RR 16 spO2 98% CBG 152

## 2022-12-17 NOTE — ED Provider Notes (Signed)
[place] EMERGENCY DEPARTMENT AT [facility] Provider Note   CSN: [id number] Arrival date & time: 12/17/22  1716     History  Chief Complaint  Patient presents with   Weakness    [name] [name] is a 62 y.o. male with a history of metastatic adenocarcinoma, NSTEMI, pulmonary embolism, left sided above the knee amputation who presents to the ED today for shortness of breath and chest pain. Patient states that yesterday he was sitting on the couch when he developed shortness of breath. He states it's worse when he lives down flat and better when his head is elevated. Additionally, he reports that he has pain around his heart since this morning. The pain feels like a dull ache and does not radiate. He denies any alleviating or aggravating factors.    Home Medications Prior to Admission medications   Medication Sig Start Date End Date Taking? Authorizing Provider  albuterol (VENTOLIN HFA) 108 (90 Base) MCG/ACT inhaler Inhale 2 puffs into the lungs every 6 (six) hours as needed for wheezing or shortness of breath. 11/30/22   Ladene Artist, MD  amiodarone (PACERONE) 200 MG tablet Take 1 tablet (200 mg total) by mouth 2 (two) times daily. 09/27/22 09/27/23  Adron Bene, MD  amLODipine (NORVASC) 10 MG tablet Take 1 tablet (10 mg total) by mouth daily. 08/09/22 11/30/22  Carrion-Carrero, Karle Starch, MD  apixaban (ELIQUIS) 5 MG TABS tablet Take 1 tablet (5 mg total) by mouth 2 (two) times daily. 11/30/22 12/30/22  Rocky Morel, DO  atorvastatin (LIPITOR) 40 MG tablet Take 1 tablet (40 mg total) by mouth daily at 6 PM. 09/14/22 11/30/22  Rocky Morel, DO  clopidogrel (PLAVIX) 75 MG tablet Take 75 mg by mouth daily. 09/14/22   [provider]  diclofenac Sodium (VOLTAREN) 1 % GEL Apply 2 g topically 4 (four) times daily. 08/09/22   Carrion-Carrero, Karle Starch, MD  DULoxetine (CYMBALTA) 30 MG capsule Take 30 mg by mouth daily as needed (sleep). 06/21/22   [provider]  feeding  supplement (ENSURE ENLIVE / ENSURE PLUS) LIQD Take 237 mLs by mouth 3 (three) times daily between meals. 08/09/22   Carrion-Carrero, Karle Starch, MD  ferrous sulfate 325 (65 FE) MG tablet Take 1 tablet (325 mg total) by mouth daily with breakfast for 100 doses. 11/30/22 03/10/23  Rocky Morel, DO  fluticasone-salmeterol (ADVAIR HFA) [phone] MCG/ACT inhaler Inhale 2 puffs into the lungs 2 (two) times daily. 10/03/22   Adron Bene, MD  folic acid (FOLVITE) 1 MG tablet Take 1 tablet (1 mg total) by mouth daily. 05/16/22   Rocky Morel, DO  gabapentin (NEURONTIN) 300 MG capsule Take 1 capsule (300 mg total) by mouth 2 (two) times daily. Daily or twice daily if tolerated. 11/09/22   Rocky Morel, DO  isosorbide mononitrate (IMDUR) 30 MG 24 hr tablet Take 1 tablet (30 mg total) by mouth daily. 11/29/22 12/29/22  Rocky Morel, DO  lidocaine-prilocaine (EMLA) cream Apply 1 Application topically as directed. Apply to port site 2 hours prior to stick and cover with Press-and-Seal to numb port before access 05/29/22   Ladene Artist, MD  loperamide (IMODIUM A-D) 2 MG tablet Take 2 mg by mouth 4 (four) times daily as needed for diarrhea or loose stools.    [provider]  meloxicam (MOBIC) 7.5 MG tablet Take 1 tablet (7.5 mg total) by mouth daily as needed for pain. 11/30/22 03/30/23  Rocky Morel, DO  methotrexate (RHEUMATREX) 2.5 MG tablet Take 3 tablets (7.5 mg  total) by mouth once a week. Caution:Chemotherapy. Protect from light. Patient not taking: Reported on 11/30/2022 10/17/22   Rocky Morel, DO  ondansetron (ZOFRAN) 8 MG tablet START TAKING 72 HOURS AFTER CHEMOTHERAPY TREATMENT. 12/15/22   Ladene Artist, MD  Oxycodone HCl 10 MG TABS Take 10 mg by mouth every 6 (six) hours. 09/14/22   [provider]  potassium chloride (KLOR-CON M10) 10 MEQ tablet TAKE 2 TABLETS(20 MEQ) BY MOUTH DAILY 10/19/22   Ladene Artist, MD  prochlorperazine (COMPAZINE) 10 MG tablet TAKE 1 TABLET (10 MG TOTAL)  BY MOUTH EVERY 6 (SIX) HOURS AS NEEDED. 11/27/22   Ladene Artist, MD      Allergies    Dilaudid [hydromorphone hcl], Ramipril, and Tramadol    Review of Systems   Review of Systems  Respiratory:  Positive for shortness of breath.   Cardiovascular:  Positive for chest pain.  All other systems reviewed and are negative.   Physical Exam Updated Vital Signs BP 100/71   Pulse 75   Temp 98.3 F (36.8 C) (Oral)   Resp 14   SpO2 100%  Physical Exam Vitals and nursing note reviewed.  Constitutional:      General: He is not in acute distress.    Appearance: Normal appearance.  HENT:     Head: Normocephalic and atraumatic.     Mouth/Throat:     Mouth: Mucous membranes are moist.  Eyes:     Conjunctiva/sclera: Conjunctivae normal.     Pupils: Pupils are equal, round, and reactive to light.  Cardiovascular:     Rate and Rhythm: Normal rate and regular rhythm.     Pulses: Normal pulses.     Heart sounds: Normal heart sounds.  Pulmonary:     Effort: Pulmonary effort is normal.     Comments: Decreased breath sounds in right lower lobe Abdominal:     Palpations: Abdomen is soft.     Tenderness: There is no abdominal tenderness.  Musculoskeletal:     Comments: Left leg amputated below hip  Skin:    General: Skin is warm and dry.     Findings: No rash.  Neurological:     General: No focal deficit present.     Mental Status: He is alert.  Psychiatric:        Mood and Affect: Mood normal.        Behavior: Behavior normal.     ED Results / Procedures / Treatments   Labs (all labs ordered are listed, but only abnormal results are displayed) Labs Reviewed  BASIC METABOLIC PANEL - Abnormal; Notable for the following components:      Result Value   CO2 20 (*)    Glucose, Bld 148 (*)    Calcium 8.0 (*)    All other components within normal limits  CBC WITH DIFFERENTIAL/PLATELET - Abnormal; Notable for the following components:   RBC 3.71 (*)    Hemoglobin 10.4 (*)    HCT  34.0 (*)    RDW 17.7 (*)    Platelets 110 (*)    All other components within normal limits  TROPONIN I (HIGH SENSITIVITY)    EKG EKG Interpretation  Date/Time:  Sunday December 17 2022 20:18:46 EDT Ventricular Rate:  73 PR Interval:  147 QRS Duration: 88 QT Interval:  523 QTC Calculation: 577 R Axis:   53 Text Interpretation: Sinus rhythm Inferior infarct, old Prolonged QT interval Confirmed by Alvino Blood ([id number]) on 12/17/2022 8:19:53 PM  Radiology CT Angio Chest  PE W and/or Wo Contrast  Result Date: 12/17/2022 CLINICAL DATA:  Shortness of breath and chest pain. EXAM: CT ANGIOGRAPHY CHEST WITH CONTRAST TECHNIQUE: Multidetector CT imaging of the chest was performed using the standard protocol during bolus administration of intravenous contrast. Multiplanar CT image reconstructions and MIPs were obtained to evaluate the vascular anatomy. RADIATION DOSE REDUCTION: This exam was performed according to the departmental dose-optimization program which includes automated exposure control, adjustment of the mA and/or kV according to patient size and/or use of iterative reconstruction technique. CONTRAST:  75mL OMNIPAQUE IOHEXOL 350 MG/ML SOLN COMPARISON:  September 12, 2022 and August 04, 2022 FINDINGS: Cardiovascular: A right-sided venous Port-A-Cath is in place. There is marked severity calcification of the thoracic aorta, without evidence of aortic aneurysm or dissection. Satisfactory opacification of the pulmonary arteries to the segmental level. No evidence of pulmonary embolism. There is mild cardiomegaly. No pericardial effusion. Mediastinum/Nodes: No enlarged mediastinal, hilar, or axillary lymph nodes. Thyroid gland, trachea, and esophagus demonstrate no significant findings. Lungs/Pleura: A new 7 mm noncalcified lung nodule is seen within the superior segment right lower lobe (axial CT image 62, CT series 12). An additional 9 mm posterior right lower lobe lung nodule is noted (axial CT image  96, CT series 12). This lung nodule measures 5 mm on the prior study. Mild posterolateral bilateral upper lobe and mild-to-moderate severity posterior bilateral lower lobe atelectasis is seen. There are small bilateral pleural effusions. No pneumothorax is identified. Upper Abdomen: Multiple heterogeneous low-attenuation liver masses of various sizes are seen. These areas are present on the prior exam. A mild amount of branching air attenuation is seen within the anterior aspect of the left lobe of the liver (axial CT images 105 through 119, CT series 4). This represents a new finding. Mild to moderate severity splenomegaly is noted. There is a mild amount of perihepatic free fluid. A 4.6 cm x 3.7 cm low-attenuation is seen extending from the posterior aspect of the left lobe of the liver to the region of the gastric fundus (axial CT image 118, CT series 4). This area measures 4.8 cm x 3.9 cm on the prior study. Musculoskeletal: A chronic fracture deformity is seen involving the body of the sternum. Chronic bilateral anterior rib fractures are noted. No acute osseous abnormalities are identified. Review of the MIP images confirms the above findings. IMPRESSION: 1. No evidence of pulmonary embolism. 2. Small bilateral pleural effusions with mild-to-moderate severity posterior bilateral lower lobe atelectasis. 3. New 7 mm and enlarging 9 mm right lower lobe lung nodules, concerning for the presence of pulmonary metastasis. 4. Multiple heterogeneous low-attenuation liver masses of various sizes, consistent with metastatic disease. 5. Mild amount of branching air attenuation within the anterior aspect of the left lobe of the liver, which may be biliary in location. 6. Mild amount of perihepatic free fluid. 7. Mild to moderate severity splenomegaly. 8. Chronic fracture deformity of the body of the sternum. 9. Chronic bilateral anterior rib fractures. 10. Aortic atherosclerosis. Aortic Atherosclerosis (ICD10-I70.0).  Electronically Signed   By: Aram Candela M.D.   On: 12/17/2022 19:53   DG Chest 2 View  Result Date: 12/17/2022 CLINICAL DATA:  Short of breath, generalized weakness for 3 days, history of liver cancer EXAM: CHEST - 2 VIEW COMPARISON:  08/04/2022 FINDINGS: Frontal and lateral views of the chest demonstrate a stable right chest wall port tip in the region of the superior vena cava. Cardiac silhouette is unremarkable. No acute airspace disease, effusion, or pneumothorax. No acute  bony abnormalities. IMPRESSION: 1. No acute intrathoracic process. Electronically Signed   By: Sharlet Salina M.D.   On: 12/17/2022 18:33    Procedures Procedures: not indicated.   Medications Ordered in ED Medications  iohexol (OMNIPAQUE) 350 MG/ML injection 75 mL (75 mLs Intravenous Contrast Given 12/17/22 1913)    ED Course/ Medical Decision Making/ A&P                             Medical Decision Making Amount and/or Complexity of Data Reviewed Labs: ordered. Radiology: ordered.   This patient presents to the ED for concern of shortness of breath and chest pain, this involves an extensive number of treatment options, and is a complaint that carries with it a high risk of complications and morbidity.   Differential diagnosis includes: ACS, pulmonary embolism, pneumonia, pneumothorax, pleural effusion, etc.   Co morbidities that complicate the patient evaluation  History of PE NSTEMI Hypertension Hyperlipidemia Anemia   Additional history obtained:  Additional history obtained from patient's records.   Cardiac Monitoring / EKG:  The patient was maintained on a cardiac monitor.  I personally viewed and interpreted the cardiac monitored which showed: sinus rhythm with old inferior infarct present with a heart rate of 73 bpm.   Lab Tests:  I ordered and personally interpreted labs.  The pertinent results include:   Negative troponin CBC within normal limits - no signs of acute  anemia BMP within normal limits.   Imaging Studies ordered:  I ordered imaging studies including CXR and CTA PE study. I independently visualized and interpreted imaging which showed: CXR shows no acute intrathoracic processes. CTA showed: 1. No evidence of pulmonary embolism. 2. Small bilateral pleural effusions with mild-to-moderate severity posterior bilateral lower lobe atelectasis. 3. New 7 mm and enlarging 9 mm right lower lobe lung nodules, concerning for the presence of pulmonary metastasis. 4. Multiple heterogeneous low-attenuation liver masses of various sizes, consistent with metastatic disease. 5. Mild amount of branching air attenuation within the anterior aspect of the left lobe of the liver, which may be biliary in location. 6. Mild amount of perihepatic free fluid. 7. Mild to moderate severity splenomegaly. 8. Chronic fracture deformity of the body of the sternum. 9. Chronic bilateral anterior rib fractures. 10. Aortic atherosclerosis. I agree with the radiologist interpretation   Problem List / ED Course / Critical interventions / Medication management  Shortness of breath and chest pain Patient is in no acute distress. O2 sat is 98% on room air. I have reviewed the patients home medicines and have made adjustments as needed   Social Determinants of Health:  Housing   Test / Admission - Considered:  Discussed with Dr. Suezanne Jacquet. Informed patient of imaging results.  Instructed to follow-up with oncologist.  Patient reports he has an appointment scheduled in 3 days.  He is stable and safe for discharge home at this time.       Final Clinical Impression(s) / ED Diagnoses Final diagnoses:  Pleural effusion, bilateral  Lung nodule    Rx / DC Orders ED Discharge Orders     None         Maxwell Marion, PA-C 12/17/22 2117    Lonell Grandchild, MD 12/22/22 [phone]

## 2022-12-17 NOTE — ED Notes (Signed)
Pt's wife on the way to pick pt up.

## 2022-12-18 DIAGNOSIS — I739 Peripheral vascular disease, unspecified: Secondary | ICD-10-CM | POA: Diagnosis not present

## 2022-12-18 DIAGNOSIS — Z993 Dependence on wheelchair: Secondary | ICD-10-CM | POA: Diagnosis not present

## 2022-12-18 DIAGNOSIS — K219 Gastro-esophageal reflux disease without esophagitis: Secondary | ICD-10-CM | POA: Diagnosis not present

## 2022-12-18 DIAGNOSIS — I10 Essential (primary) hypertension: Secondary | ICD-10-CM | POA: Diagnosis not present

## 2022-12-18 DIAGNOSIS — J439 Emphysema, unspecified: Secondary | ICD-10-CM | POA: Diagnosis not present

## 2022-12-18 DIAGNOSIS — E43 Unspecified severe protein-calorie malnutrition: Secondary | ICD-10-CM | POA: Diagnosis not present

## 2022-12-18 DIAGNOSIS — C787 Secondary malignant neoplasm of liver and intrahepatic bile duct: Secondary | ICD-10-CM | POA: Diagnosis not present

## 2022-12-18 DIAGNOSIS — E785 Hyperlipidemia, unspecified: Secondary | ICD-10-CM | POA: Diagnosis not present

## 2022-12-18 DIAGNOSIS — M069 Rheumatoid arthritis, unspecified: Secondary | ICD-10-CM | POA: Diagnosis not present

## 2022-12-18 DIAGNOSIS — G931 Anoxic brain damage, not elsewhere classified: Secondary | ICD-10-CM | POA: Diagnosis not present

## 2022-12-18 DIAGNOSIS — F32A Depression, unspecified: Secondary | ICD-10-CM | POA: Diagnosis not present

## 2022-12-18 DIAGNOSIS — Z791 Long term (current) use of non-steroidal anti-inflammatories (NSAID): Secondary | ICD-10-CM | POA: Diagnosis not present

## 2022-12-18 DIAGNOSIS — D630 Anemia in neoplastic disease: Secondary | ICD-10-CM | POA: Diagnosis not present

## 2022-12-18 DIAGNOSIS — I252 Old myocardial infarction: Secondary | ICD-10-CM | POA: Diagnosis not present

## 2022-12-18 DIAGNOSIS — I493 Ventricular premature depolarization: Secondary | ICD-10-CM | POA: Diagnosis not present

## 2022-12-18 DIAGNOSIS — M1990 Unspecified osteoarthritis, unspecified site: Secondary | ICD-10-CM | POA: Diagnosis not present

## 2022-12-18 DIAGNOSIS — R918 Other nonspecific abnormal finding of lung field: Secondary | ICD-10-CM | POA: Diagnosis not present

## 2022-12-18 DIAGNOSIS — I2510 Atherosclerotic heart disease of native coronary artery without angina pectoris: Secondary | ICD-10-CM | POA: Diagnosis not present

## 2022-12-18 DIAGNOSIS — J449 Chronic obstructive pulmonary disease, unspecified: Secondary | ICD-10-CM | POA: Diagnosis not present

## 2022-12-18 DIAGNOSIS — G894 Chronic pain syndrome: Secondary | ICD-10-CM | POA: Diagnosis not present

## 2022-12-18 DIAGNOSIS — M5137 Other intervertebral disc degeneration, lumbosacral region: Secondary | ICD-10-CM | POA: Diagnosis not present

## 2022-12-18 DIAGNOSIS — Z8673 Personal history of transient ischemic attack (TIA), and cerebral infarction without residual deficits: Secondary | ICD-10-CM | POA: Diagnosis not present

## 2022-12-18 DIAGNOSIS — Z7902 Long term (current) use of antithrombotics/antiplatelets: Secondary | ICD-10-CM | POA: Diagnosis not present

## 2022-12-18 DIAGNOSIS — C159 Malignant neoplasm of esophagus, unspecified: Secondary | ICD-10-CM | POA: Diagnosis not present

## 2022-12-19 ENCOUNTER — Encounter: Payer: Self-pay | Admitting: Student

## 2022-12-19 DIAGNOSIS — K219 Gastro-esophageal reflux disease without esophagitis: Secondary | ICD-10-CM | POA: Diagnosis not present

## 2022-12-19 DIAGNOSIS — Z8673 Personal history of transient ischemic attack (TIA), and cerebral infarction without residual deficits: Secondary | ICD-10-CM | POA: Diagnosis not present

## 2022-12-19 DIAGNOSIS — E785 Hyperlipidemia, unspecified: Secondary | ICD-10-CM | POA: Diagnosis not present

## 2022-12-19 DIAGNOSIS — M5137 Other intervertebral disc degeneration, lumbosacral region: Secondary | ICD-10-CM | POA: Diagnosis not present

## 2022-12-19 DIAGNOSIS — I739 Peripheral vascular disease, unspecified: Secondary | ICD-10-CM | POA: Diagnosis not present

## 2022-12-19 DIAGNOSIS — M1990 Unspecified osteoarthritis, unspecified site: Secondary | ICD-10-CM | POA: Diagnosis not present

## 2022-12-19 DIAGNOSIS — D630 Anemia in neoplastic disease: Secondary | ICD-10-CM | POA: Diagnosis not present

## 2022-12-19 DIAGNOSIS — I493 Ventricular premature depolarization: Secondary | ICD-10-CM | POA: Diagnosis not present

## 2022-12-19 DIAGNOSIS — F32A Depression, unspecified: Secondary | ICD-10-CM | POA: Diagnosis not present

## 2022-12-19 DIAGNOSIS — M069 Rheumatoid arthritis, unspecified: Secondary | ICD-10-CM | POA: Diagnosis not present

## 2022-12-19 DIAGNOSIS — C787 Secondary malignant neoplasm of liver and intrahepatic bile duct: Secondary | ICD-10-CM | POA: Diagnosis not present

## 2022-12-19 DIAGNOSIS — E43 Unspecified severe protein-calorie malnutrition: Secondary | ICD-10-CM | POA: Diagnosis not present

## 2022-12-19 DIAGNOSIS — I2510 Atherosclerotic heart disease of native coronary artery without angina pectoris: Secondary | ICD-10-CM | POA: Diagnosis not present

## 2022-12-19 DIAGNOSIS — J449 Chronic obstructive pulmonary disease, unspecified: Secondary | ICD-10-CM | POA: Diagnosis not present

## 2022-12-19 DIAGNOSIS — C159 Malignant neoplasm of esophagus, unspecified: Secondary | ICD-10-CM | POA: Diagnosis not present

## 2022-12-19 DIAGNOSIS — Z993 Dependence on wheelchair: Secondary | ICD-10-CM | POA: Diagnosis not present

## 2022-12-19 DIAGNOSIS — I252 Old myocardial infarction: Secondary | ICD-10-CM | POA: Diagnosis not present

## 2022-12-19 DIAGNOSIS — I10 Essential (primary) hypertension: Secondary | ICD-10-CM | POA: Diagnosis not present

## 2022-12-19 DIAGNOSIS — Z791 Long term (current) use of non-steroidal anti-inflammatories (NSAID): Secondary | ICD-10-CM | POA: Diagnosis not present

## 2022-12-19 DIAGNOSIS — J439 Emphysema, unspecified: Secondary | ICD-10-CM | POA: Diagnosis not present

## 2022-12-19 DIAGNOSIS — R918 Other nonspecific abnormal finding of lung field: Secondary | ICD-10-CM | POA: Diagnosis not present

## 2022-12-19 DIAGNOSIS — Z7902 Long term (current) use of antithrombotics/antiplatelets: Secondary | ICD-10-CM | POA: Diagnosis not present

## 2022-12-19 DIAGNOSIS — G931 Anoxic brain damage, not elsewhere classified: Secondary | ICD-10-CM | POA: Diagnosis not present

## 2022-12-19 DIAGNOSIS — G894 Chronic pain syndrome: Secondary | ICD-10-CM | POA: Diagnosis not present

## 2022-12-20 ENCOUNTER — Other Ambulatory Visit: Payer: Self-pay

## 2022-12-20 ENCOUNTER — Inpatient Hospital Stay: Payer: 59

## 2022-12-20 ENCOUNTER — Encounter: Payer: Self-pay | Admitting: Nurse Practitioner

## 2022-12-20 ENCOUNTER — Inpatient Hospital Stay (HOSPITAL_BASED_OUTPATIENT_CLINIC_OR_DEPARTMENT_OTHER): Payer: 59 | Admitting: Nurse Practitioner

## 2022-12-20 ENCOUNTER — Encounter: Payer: Self-pay | Admitting: Nutrition

## 2022-12-20 ENCOUNTER — Telehealth: Payer: Self-pay

## 2022-12-20 VITALS — BP 104/72 | HR 84 | Temp 98.1°F | Resp 18 | Ht 68.0 in | Wt 136.0 lb

## 2022-12-20 VITALS — BP 95/71 | HR 67

## 2022-12-20 DIAGNOSIS — Z8674 Personal history of sudden cardiac arrest: Secondary | ICD-10-CM | POA: Diagnosis not present

## 2022-12-20 DIAGNOSIS — Z5111 Encounter for antineoplastic chemotherapy: Secondary | ICD-10-CM | POA: Diagnosis not present

## 2022-12-20 DIAGNOSIS — Z79899 Other long term (current) drug therapy: Secondary | ICD-10-CM | POA: Diagnosis not present

## 2022-12-20 DIAGNOSIS — D509 Iron deficiency anemia, unspecified: Secondary | ICD-10-CM | POA: Diagnosis not present

## 2022-12-20 DIAGNOSIS — C159 Malignant neoplasm of esophagus, unspecified: Secondary | ICD-10-CM

## 2022-12-20 DIAGNOSIS — C801 Malignant (primary) neoplasm, unspecified: Secondary | ICD-10-CM

## 2022-12-20 DIAGNOSIS — C787 Secondary malignant neoplasm of liver and intrahepatic bile duct: Secondary | ICD-10-CM | POA: Diagnosis not present

## 2022-12-20 DIAGNOSIS — D696 Thrombocytopenia, unspecified: Secondary | ICD-10-CM | POA: Diagnosis not present

## 2022-12-20 DIAGNOSIS — R590 Localized enlarged lymph nodes: Secondary | ICD-10-CM | POA: Diagnosis not present

## 2022-12-20 DIAGNOSIS — R970 Elevated carcinoembryonic antigen [CEA]: Secondary | ICD-10-CM | POA: Diagnosis not present

## 2022-12-20 DIAGNOSIS — R911 Solitary pulmonary nodule: Secondary | ICD-10-CM | POA: Diagnosis not present

## 2022-12-20 LAB — CMP (CANCER CENTER ONLY)
ALT: 15 U/L (ref 0–44)
AST: 25 U/L (ref 15–41)
Albumin: 3 g/dL — ABNORMAL LOW (ref 3.5–5.0)
Alkaline Phosphatase: 194 U/L — ABNORMAL HIGH (ref 38–126)
Anion gap: 6 (ref 5–15)
BUN: 18 mg/dL (ref 8–23)
CO2: 23 mmol/L (ref 22–32)
Calcium: 8.7 mg/dL — ABNORMAL LOW (ref 8.9–10.3)
Chloride: 108 mmol/L (ref 98–111)
Creatinine: 0.93 mg/dL (ref 0.61–1.24)
GFR, Estimated: >60 mL/min (ref >60)
Glucose, Bld: 119 mg/dL — ABNORMAL HIGH (ref 70–99)
Potassium: 4.1 mmol/L (ref 3.5–5.1)
Sodium: 137 mmol/L (ref 135–145)
Total Bilirubin: 0.4 mg/dL (ref 0.3–1.2)
Total Protein: 5.9 g/dL — ABNORMAL LOW (ref 6.5–8.1)

## 2022-12-20 LAB — CBC WITH DIFFERENTIAL (CANCER CENTER ONLY)
Abs Immature Granulocytes: 0.01 10*3/uL (ref 0.00–0.07)
Basophils Absolute: 0.1 10*3/uL (ref 0.0–0.1)
Basophils Relative: 1 %
Eosinophils Absolute: 0.2 10*3/uL (ref 0.0–0.5)
Eosinophils Relative: 3 %
HCT: 33.8 % — ABNORMAL LOW (ref 39.0–52.0)
Hemoglobin: 10.6 g/dL — ABNORMAL LOW (ref 13.0–17.0)
Immature Granulocytes: 0 %
Lymphocytes Relative: 23 %
Lymphs Abs: 1.3 10*3/uL (ref 0.7–4.0)
MCH: 28.6 pg (ref 26.0–34.0)
MCHC: 31.4 g/dL (ref 30.0–36.0)
MCV: 91.1 fL (ref 80.0–100.0)
Monocytes Absolute: 0.6 10*3/uL (ref 0.1–1.0)
Monocytes Relative: 10 %
Neutro Abs: 3.5 10*3/uL (ref 1.7–7.7)
Neutrophils Relative %: 63 %
Platelet Count: 116 10*3/uL — ABNORMAL LOW (ref 150–400)
RBC: 3.71 MIL/uL — ABNORMAL LOW (ref 4.22–5.81)
RDW: 18 % — ABNORMAL HIGH (ref 11.5–15.5)
WBC Count: 5.6 10*3/uL (ref 4.0–10.5)
nRBC: 0 % (ref 0.0–0.2)

## 2022-12-20 LAB — CEA (ACCESS): CEA (CHCC): 201.2 ng/mL — ABNORMAL HIGH (ref 0.00–5.00)

## 2022-12-20 MED ORDER — SODIUM CHLORIDE 0.9 % IV SOLN: INTRAVENOUS | Status: DC

## 2022-12-20 MED ORDER — SODIUM CHLORIDE 0.9% FLUSH
10.0000 mL | INTRAVENOUS | Status: AC | PRN
  Administered 2022-12-20: 10 mL

## 2022-12-20 MED ORDER — HEPARIN SOD (PORK) LOCK FLUSH 100 UNIT/ML IV SOLN
500.0000 [IU] | INTRAVENOUS | Status: AC | PRN
  Administered 2022-12-20: 500 [IU]

## 2022-12-20 NOTE — Progress Notes (Signed)
[facility] OFFICE PROGRESS NOTE   Diagnosis: Gastroesophageal cancer  INTERVAL HISTORY:   Mr. Hanway returns as scheduled.  He completed cycle 5 Taxol/cyramza 11/30/2022.  He did not come for his appointment 12/14/2022.  He was seen in the emergency department 12/17/2022 with shortness of breath.  CT chest negative for PE, small bilateral pleural effusions, new 7 mm and enlarging 9 mm right lower lobe nodules, multiple low-attenuation liver masses of various sizes, mild perihepatic free fluid, mild to moderate splenomegaly.  The dyspnea improved following his visit to the emergency department.  He reports recurrent dyspnea while sitting in the lobby this morning.  The dyspnea is better with 2 L of oxygen.  No fever.  No cough.  No rash or diarrhea.  No nausea or vomiting.  Good appetite.  He missed his visit last week due to "weakness".  He continues to feel weak.  Objective:  Vital signs in last 24 hours:  Blood pressure 104/72, pulse 84, temperature 98.1 F (36.7 C), temperature source Oral, resp. rate 18, height 5\' 8"  (1.727 m), weight 136 lb (61.7 kg), SpO2 98 %.    HEENT: White coating over tongue. Resp: Distant breath sounds.  No respiratory distress. Cardio: Distant heart sounds.  Regular. GI: No hepatosplenomegaly. Vascular: Trace edema right lower leg. Neuro: He appears sleepy.  Oriented.  Follows commands.   Skin: No rash. Port-A-Cath without erythema.  Lab Results:  Lab Results  Component Value Date   WBC 5.6 12/20/2022   HGB 10.6 (L) 12/20/2022   HCT 33.8 (L) 12/20/2022   MCV 91.1 12/20/2022   PLT 116 (L) 12/20/2022   NEUTROABS 3.5 12/20/2022    Imaging:  No results found.  Medications: I have reviewed the patient's current medications.  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 -02/11/2022  CTA chest-7.8 x 8.5 cm subtle hypoattenuating mass lesion in segment IV of the liver. -02/11/2022 CT abdomen/pelvis-8.7 x 7.8 or ill-defined irregular mass in the medial segment left liver, 4.5 x 3.4 cm necrotic mass lesion just cranial to the esophagogastric junction with metastatic lymphadenopathy in the abdomen and pelvis, irregular nodular peritoneal thickening in the lower abdomen and pelvis -02/11/2022 MRI abdomen-rim-enhancing likely necrotic mass or lymph node conglomerate centered around the gastroesophageal junction measuring 5.0 x 3.9 cm, large rim-enhancing likely necrotic liver mass centered in the anterior left lobe of the liver measuring 7.8 x 7.2 cm, enlarged gastrohepatic ligament and retroperitoneal lymph nodes -Labs from 02/11/2022-AFP 2.4, CA 19.9 88, CEA 287 -Ultrasound-guided liver biopsy performed 02/15/2022-adenocarcinoma with extensive necrosis, CK7 positive, negative for CK20, CDX2, TTF-1, and PAX8, consistent with a pancreatic, lung, or upper gastrointestinal primary -PET 03/08/2022-hypermetabolic distal esophagus mass, hypermetabolic centrally necrotic hepatic mass, hypermetabolic periportal nodes, exophytic GE junction mass without hypermetabolism-likely benign lesion, no hypermetabolism in the pancreas head, bilateral hypermetabolic adrenal gland lesions, small hypermetabolic left parotid gland lesion.  No loss of mismatch repair protein expression, HER2 3+ -CTs 03/11/2022-no change from PET 03/08/2022, known distal esophagus mass-not visualized on unenhanced CT, large hepatic metastasis and upper abdominal nodal metastases, chronic pancreatitis with 4.3 cm pseudocyst in the pancreas head -Cycle 1 FOLFOX, trastuzumab, Pembrolizumab 04/06/2022 -Cycle 2 oxaliplatin, 5-FU bolus/pump and trastuzumab held due to hospitalization following cycle 1 with possible coronary vasospasm -Pembrolizumab 04/25/2022 -CT abdomen/pelvis 05/02/2022-increased peripancreatic edema, increased size of  pancreas head pseudocyst, increased necrotic nodal masses at the GE junction -Cycle 3 oxaliplatin,  trastuzumab, pembrolizumab 05/15/2022; 5-FU held secondary to cardiac toxicity following cycle 1 -Cycle 4 oxaliplatin, trastuzumab 05/29/2022, 5-FU held secondary to cardiac toxicity -Cycle 5 oxaliplatin, trastuzumab, Pembrolizumab (6-week dosing for Pembrolizumab) 06/12/2022, 5-FU held secondary to cardiac toxicity -Cycle 6 oxaliplatin, trastuzumab 06/28/2022, 5-FU held -Cycle 7 oxaliplatin, trastuzumab 07/17/2022, 5-FU held -CTs 07/19/2022-decrease in the dominant anterior liver mass, 2 new small liver lesions compared to 05/02/2022, new right lower lobe nodule, enlargement of epicardial lymph nodes, slight decrease in size of celiac axis, gastropathic ligament, and portacaval lymphadenopathy, unchanged esophageal thickening -Cycle 8 oxaliplatin and pembrolizumab 08/14/2022, 5-FU and trastuzumab held secondary to potential cardiac toxicity -CTs 09/12/2022-increased number hepatic metastases, few are larger; multiple abnormal lymph nodes identified in the abdomen and lower mediastinum, relatively similar; increased stranding along the mesentery and retroperitoneum; persistent wall thickening of the duodenum with slight ectasia of the lumen; persistent left hepatic lobe biliary duct dilatation, likely compressive from adjacent metastatic lesions; previous 6 mm right lower lobe lung nodule obscured by motion. -Cycle 1 Taxol/cyramza 09/20/2022 -Cycle 2 Taxol/Cyramza 10/04/2022 -Cycle 3 Taxol/Cyramza 10/19/2022 (Taxol dose reduced secondary to thrombocytopenia) -Cycle 4 Taxol/cyramza 11/16/2022 -Cycle 5 Taxol/Cyramza 11/30/2022 -CT chest emergency department 12/17/2022 due to complaint of dyspnea-no enlarged mediastinal, hilar or axillary lymph nodes.  New 7 mm lung nodule seen within the superior segment right lower lobe.  Additional 9 mm posterior right lower lobe lung nodule, previously measured 5 mm on prior study.   Small bilateral pleural effusions.  Multiple liver masses of various sizes, present on the prior exam.  4.6 x 3.7 low-attenuation extending from the posterior aspect of the left lobe of the liver to the region of the gastric fundus, previously measured 4.8 x 3.9 cm   2.  Myocardial infarction/cardiac arrest 01/31/2022 -Echocardiogram 02/01/2022-LVEF 60-65%, right ventricle severely dilated with hypokinesis in the basal and midportion, hypokinesis in the apical portion, right ventricular systolic function moderately reduced -Echocardiogram on 05/01/2022, LVEF 50-55%, decreased right ventricular systolic function, moderately enlarged right ventricle -Echocardiogram 08/05/2022-LVEF 45-50%, mildly decreased left ventricular function, moderate reduced right ventricular function 3.  Microcytic anemia -Labs from 02/11/2022-ferritin 151, iron 13, percent saturation 5% 4.  DVT/pulmonary embolism-bilateral lower extremity DVTs 01/10/2022, pulmonary emboli on chest CT 01/31/2022-apixaban 5.  PAD status post AKA in 2021 6.  RA 7.  Bilateral pleural effusions -Ultrasound-guided thoracentesis performed on 02/11/2022.  Cytology negative for malignancy. 8.  COPD 9.  Admission 03/11/2022 with E. coli bacteremia Ultrasound-guided biopsy/aspiration of dominant necrotic appearing liver lesion 03/17/2022-scant fluid aspirated-negative culture, no organisms or white cells.  Pathology with necrotic tissue. 10.  Admission 04/29/2022 after a fall, multiple skull fractures 11.  Fever, hypotension-Klebsiella bacteremia on blood culture 04/30/2022 12.  Admission 04/09/2022 with chest pain, NSTEMI 13.  Admission 08/04/2022 with ventricular tachycardia, status post cardioversion, noted to have hypokalemia and hypomagnesemia, started on amiodarone 14.  Rash following cycle 3 Taxol/ramucirumab-admitted to [facility] due to concern for SJS.  SJS and disseminated zoster ruled out.  Disposition: Mr. Thwaites has completed 5 cycles of Taxol/cyramza.   He had a chest CT in the emergency department recently due to complaint of dyspnea.  We reviewed the report and images with Mr. Boydton and family.  Overall disease appears stable.  Recommend continuation of Taxol/cyramza.  He agrees with this plan but declines treatment today.  We will give him IV fluids today and reschedule treatment to early next week.  CBC and chemistry panel reviewed, stable.  He will return for follow-up and treatment in approximately 3  weeks.  We are available to see him sooner if needed.  Patient seen with Dr. Truett Perna.    Lonna Cobb ANP/GNP-BC   12/20/2022  9:32 AM This was a shared visit with Lonna Cobb.  Mr. Errico was interviewed and examined.  We reviewed the emergency room CT images with him.  Mr. Bures has metastatic adenocarcinoma, likely from gastroesophageal primary.  He is current being treated with salvage systemic therapy with Taxol/ramucirumab.  The CT findings are consistent with overall stable disease.  The CEA is lower.  We recommend continuing Taxol/ramucirumab.  Mr. Dollard does not feel strong enough to receive chemotherapy today.  The next cycle of chemotherapy will be scheduled for the week of 12/25/2022.  I was present for greater than 50% of today's visit.  I performed medical decision making.  Mancel Bale, MD

## 2022-12-20 NOTE — Telephone Encounter (Signed)
Patient request a case of ensure and gas card

## 2022-12-20 NOTE — Patient Instructions (Signed)

## 2022-12-20 NOTE — Telephone Encounter (Signed)
[employer] called you back about pt left a vmail for you to call back ... Her name is [name] [name] her  number [phone]... she stated that they got a referral from you  for pt  but needs more info

## 2022-12-20 NOTE — Progress Notes (Signed)
Patient provided with one complimentary case of ensure due to one or more of the following reasons:  malnutrition, weight loss/maintenance, poor appetite AND food insecurity or financial hardship.    

## 2022-12-20 NOTE — Progress Notes (Signed)
Patient seen by Lonna Cobb NP today  Vitals are within treatment parameters.  Labs reviewed by Lonna Cobb NP and are within treatment parameters.  Per physician team, patient will not be receiving treatment today.  The patient will be receiving intravenous fluids. Please obtain the patient's blood pressure halfway through the procedure and at the end. After checking the blood pressure each time, please inform us. Thank you.

## 2022-12-21 DIAGNOSIS — J439 Emphysema, unspecified: Secondary | ICD-10-CM | POA: Diagnosis not present

## 2022-12-21 DIAGNOSIS — I252 Old myocardial infarction: Secondary | ICD-10-CM | POA: Diagnosis not present

## 2022-12-21 DIAGNOSIS — Z7902 Long term (current) use of antithrombotics/antiplatelets: Secondary | ICD-10-CM | POA: Diagnosis not present

## 2022-12-21 DIAGNOSIS — C159 Malignant neoplasm of esophagus, unspecified: Secondary | ICD-10-CM | POA: Diagnosis not present

## 2022-12-21 DIAGNOSIS — C787 Secondary malignant neoplasm of liver and intrahepatic bile duct: Secondary | ICD-10-CM | POA: Diagnosis not present

## 2022-12-21 DIAGNOSIS — I2510 Atherosclerotic heart disease of native coronary artery without angina pectoris: Secondary | ICD-10-CM | POA: Diagnosis not present

## 2022-12-21 DIAGNOSIS — F32A Depression, unspecified: Secondary | ICD-10-CM | POA: Diagnosis not present

## 2022-12-21 DIAGNOSIS — Z8673 Personal history of transient ischemic attack (TIA), and cerebral infarction without residual deficits: Secondary | ICD-10-CM | POA: Diagnosis not present

## 2022-12-21 DIAGNOSIS — M1990 Unspecified osteoarthritis, unspecified site: Secondary | ICD-10-CM | POA: Diagnosis not present

## 2022-12-21 DIAGNOSIS — I739 Peripheral vascular disease, unspecified: Secondary | ICD-10-CM | POA: Diagnosis not present

## 2022-12-21 DIAGNOSIS — E785 Hyperlipidemia, unspecified: Secondary | ICD-10-CM | POA: Diagnosis not present

## 2022-12-21 DIAGNOSIS — M5137 Other intervertebral disc degeneration, lumbosacral region: Secondary | ICD-10-CM | POA: Diagnosis not present

## 2022-12-21 DIAGNOSIS — G894 Chronic pain syndrome: Secondary | ICD-10-CM | POA: Diagnosis not present

## 2022-12-21 DIAGNOSIS — D630 Anemia in neoplastic disease: Secondary | ICD-10-CM | POA: Diagnosis not present

## 2022-12-21 DIAGNOSIS — E43 Unspecified severe protein-calorie malnutrition: Secondary | ICD-10-CM | POA: Diagnosis not present

## 2022-12-21 DIAGNOSIS — M069 Rheumatoid arthritis, unspecified: Secondary | ICD-10-CM | POA: Diagnosis not present

## 2022-12-21 DIAGNOSIS — J449 Chronic obstructive pulmonary disease, unspecified: Secondary | ICD-10-CM | POA: Diagnosis not present

## 2022-12-21 DIAGNOSIS — G931 Anoxic brain damage, not elsewhere classified: Secondary | ICD-10-CM | POA: Diagnosis not present

## 2022-12-21 DIAGNOSIS — Z791 Long term (current) use of non-steroidal anti-inflammatories (NSAID): Secondary | ICD-10-CM | POA: Diagnosis not present

## 2022-12-21 DIAGNOSIS — R918 Other nonspecific abnormal finding of lung field: Secondary | ICD-10-CM | POA: Diagnosis not present

## 2022-12-21 DIAGNOSIS — I10 Essential (primary) hypertension: Secondary | ICD-10-CM | POA: Diagnosis not present

## 2022-12-21 DIAGNOSIS — I493 Ventricular premature depolarization: Secondary | ICD-10-CM | POA: Diagnosis not present

## 2022-12-21 DIAGNOSIS — K219 Gastro-esophageal reflux disease without esophagitis: Secondary | ICD-10-CM | POA: Diagnosis not present

## 2022-12-21 DIAGNOSIS — Z993 Dependence on wheelchair: Secondary | ICD-10-CM | POA: Diagnosis not present

## 2022-12-22 ENCOUNTER — Telehealth: Payer: Self-pay

## 2022-12-22 NOTE — Telephone Encounter (Signed)
Transition Care Management Unsuccessful Follow-up Telephone Call  Date of discharge and from where:  [place] 6/23  Attempts:  1st Attempt  Reason for unsuccessful TCM follow-up call:  No answer/busy   Ladrea Holladay [facility], [place] [phone] [address], [place], [place] [address] Phone: [phone] Email: [email or web address]       

## 2022-12-25 ENCOUNTER — Ambulatory Visit: Payer: 59

## 2022-12-25 ENCOUNTER — Inpatient Hospital Stay: Payer: 59

## 2022-12-25 ENCOUNTER — Inpatient Hospital Stay: Payer: 59 | Attending: Oncology

## 2022-12-25 ENCOUNTER — Telehealth: Payer: Self-pay

## 2022-12-25 DIAGNOSIS — Z5111 Encounter for antineoplastic chemotherapy: Secondary | ICD-10-CM | POA: Insufficient documentation

## 2022-12-25 DIAGNOSIS — C787 Secondary malignant neoplasm of liver and intrahepatic bile duct: Secondary | ICD-10-CM | POA: Insufficient documentation

## 2022-12-25 DIAGNOSIS — D509 Iron deficiency anemia, unspecified: Secondary | ICD-10-CM | POA: Insufficient documentation

## 2022-12-25 DIAGNOSIS — D696 Thrombocytopenia, unspecified: Secondary | ICD-10-CM | POA: Insufficient documentation

## 2022-12-25 DIAGNOSIS — Z79899 Other long term (current) drug therapy: Secondary | ICD-10-CM | POA: Insufficient documentation

## 2022-12-25 DIAGNOSIS — C801 Malignant (primary) neoplasm, unspecified: Secondary | ICD-10-CM | POA: Insufficient documentation

## 2022-12-25 DIAGNOSIS — R970 Elevated carcinoembryonic antigen [CEA]: Secondary | ICD-10-CM | POA: Insufficient documentation

## 2022-12-25 NOTE — Telephone Encounter (Signed)
Transition Care Management Unsuccessful Follow-up Telephone Call  Date of discharge and from where:  [place] 6/23  Attempts:  2nd Attempt  Reason for unsuccessful TCM follow-up call:  No answer/busy   [name] [name] [facility], [place] [phone] [address], [place], [place] [address] Phone: [phone] Email: [email or web address]       

## 2022-12-25 NOTE — Telephone Encounter (Signed)
Transition Care Management Unsuccessful Follow-up Telephone Call  Date of discharge and from where:  [facility] 6/23  Attempts:  2nd Attempt  Reason for unsuccessful TCM follow-up call:  No answer/busy   Lenard Forth [facility] Guide, [facility] [phone] [address] [place], [place], [place] [address] Phone: [phone] Email: [email or web address]

## 2022-12-26 DIAGNOSIS — M1990 Unspecified osteoarthritis, unspecified site: Secondary | ICD-10-CM | POA: Diagnosis not present

## 2022-12-26 DIAGNOSIS — Z993 Dependence on wheelchair: Secondary | ICD-10-CM | POA: Diagnosis not present

## 2022-12-26 DIAGNOSIS — I739 Peripheral vascular disease, unspecified: Secondary | ICD-10-CM | POA: Diagnosis not present

## 2022-12-26 DIAGNOSIS — I10 Essential (primary) hypertension: Secondary | ICD-10-CM | POA: Diagnosis not present

## 2022-12-26 DIAGNOSIS — Z8673 Personal history of transient ischemic attack (TIA), and cerebral infarction without residual deficits: Secondary | ICD-10-CM | POA: Diagnosis not present

## 2022-12-26 DIAGNOSIS — J449 Chronic obstructive pulmonary disease, unspecified: Secondary | ICD-10-CM | POA: Diagnosis not present

## 2022-12-26 DIAGNOSIS — F32A Depression, unspecified: Secondary | ICD-10-CM | POA: Diagnosis not present

## 2022-12-26 DIAGNOSIS — I493 Ventricular premature depolarization: Secondary | ICD-10-CM | POA: Diagnosis not present

## 2022-12-26 DIAGNOSIS — M5137 Other intervertebral disc degeneration, lumbosacral region: Secondary | ICD-10-CM | POA: Diagnosis not present

## 2022-12-26 DIAGNOSIS — Z7902 Long term (current) use of antithrombotics/antiplatelets: Secondary | ICD-10-CM | POA: Diagnosis not present

## 2022-12-26 DIAGNOSIS — I2510 Atherosclerotic heart disease of native coronary artery without angina pectoris: Secondary | ICD-10-CM | POA: Diagnosis not present

## 2022-12-26 DIAGNOSIS — E785 Hyperlipidemia, unspecified: Secondary | ICD-10-CM | POA: Diagnosis not present

## 2022-12-26 DIAGNOSIS — G931 Anoxic brain damage, not elsewhere classified: Secondary | ICD-10-CM | POA: Diagnosis not present

## 2022-12-26 DIAGNOSIS — Z791 Long term (current) use of non-steroidal anti-inflammatories (NSAID): Secondary | ICD-10-CM | POA: Diagnosis not present

## 2022-12-26 DIAGNOSIS — K219 Gastro-esophageal reflux disease without esophagitis: Secondary | ICD-10-CM | POA: Diagnosis not present

## 2022-12-26 DIAGNOSIS — C787 Secondary malignant neoplasm of liver and intrahepatic bile duct: Secondary | ICD-10-CM | POA: Diagnosis not present

## 2022-12-26 DIAGNOSIS — I252 Old myocardial infarction: Secondary | ICD-10-CM | POA: Diagnosis not present

## 2022-12-26 DIAGNOSIS — M069 Rheumatoid arthritis, unspecified: Secondary | ICD-10-CM | POA: Diagnosis not present

## 2022-12-26 DIAGNOSIS — G894 Chronic pain syndrome: Secondary | ICD-10-CM | POA: Diagnosis not present

## 2022-12-26 DIAGNOSIS — D630 Anemia in neoplastic disease: Secondary | ICD-10-CM | POA: Diagnosis not present

## 2022-12-26 DIAGNOSIS — J439 Emphysema, unspecified: Secondary | ICD-10-CM | POA: Diagnosis not present

## 2022-12-26 DIAGNOSIS — R918 Other nonspecific abnormal finding of lung field: Secondary | ICD-10-CM | POA: Diagnosis not present

## 2022-12-26 DIAGNOSIS — E43 Unspecified severe protein-calorie malnutrition: Secondary | ICD-10-CM | POA: Diagnosis not present

## 2022-12-26 DIAGNOSIS — C159 Malignant neoplasm of esophagus, unspecified: Secondary | ICD-10-CM | POA: Diagnosis not present

## 2022-12-27 ENCOUNTER — Inpatient Hospital Stay (HOSPITAL_COMMUNITY): Payer: 59

## 2022-12-27 ENCOUNTER — Other Ambulatory Visit: Payer: Self-pay

## 2022-12-27 ENCOUNTER — Other Ambulatory Visit (HOSPITAL_COMMUNITY): Payer: 59

## 2022-12-27 ENCOUNTER — Emergency Department (HOSPITAL_COMMUNITY): Payer: 59

## 2022-12-27 ENCOUNTER — Inpatient Hospital Stay (HOSPITAL_COMMUNITY)
Admission: EM | Admit: 2022-12-27 | Discharge: 2023-01-01 | DRG: 871 | Disposition: A | Discharge status: Home Health | Payer: 59 | Attending: Internal Medicine | Admitting: Internal Medicine

## 2022-12-27 ENCOUNTER — Encounter (HOSPITAL_COMMUNITY): Payer: Self-pay

## 2022-12-27 DIAGNOSIS — Z86718 Personal history of other venous thrombosis and embolism: Secondary | ICD-10-CM | POA: Diagnosis not present

## 2022-12-27 DIAGNOSIS — I10 Essential (primary) hypertension: Secondary | ICD-10-CM | POA: Diagnosis not present

## 2022-12-27 DIAGNOSIS — N170 Acute kidney failure with tubular necrosis: Secondary | ICD-10-CM | POA: Diagnosis not present

## 2022-12-27 DIAGNOSIS — A419 Sepsis, unspecified organism: Principal | ICD-10-CM | POA: Diagnosis present

## 2022-12-27 DIAGNOSIS — N179 Acute kidney failure, unspecified: Secondary | ICD-10-CM | POA: Diagnosis present

## 2022-12-27 DIAGNOSIS — J440 Chronic obstructive pulmonary disease with acute lower respiratory infection: Secondary | ICD-10-CM | POA: Diagnosis not present

## 2022-12-27 DIAGNOSIS — K219 Gastro-esophageal reflux disease without esophagitis: Secondary | ICD-10-CM | POA: Diagnosis not present

## 2022-12-27 DIAGNOSIS — Z86711 Personal history of pulmonary embolism: Secondary | ICD-10-CM

## 2022-12-27 DIAGNOSIS — I739 Peripheral vascular disease, unspecified: Secondary | ICD-10-CM | POA: Diagnosis not present

## 2022-12-27 DIAGNOSIS — Z888 Allergy status to other drugs, medicaments and biological substances status: Secondary | ICD-10-CM

## 2022-12-27 DIAGNOSIS — C787 Secondary malignant neoplasm of liver and intrahepatic bile duct: Secondary | ICD-10-CM | POA: Diagnosis not present

## 2022-12-27 DIAGNOSIS — M5137 Other intervertebral disc degeneration, lumbosacral region: Secondary | ICD-10-CM | POA: Diagnosis present

## 2022-12-27 DIAGNOSIS — Z7960 Long term (current) use of unspecified immunomodulators and immunosuppressants: Secondary | ICD-10-CM

## 2022-12-27 DIAGNOSIS — Z7902 Long term (current) use of antithrombotics/antiplatelets: Secondary | ICD-10-CM

## 2022-12-27 DIAGNOSIS — R410 Disorientation, unspecified: Secondary | ICD-10-CM | POA: Diagnosis not present

## 2022-12-27 DIAGNOSIS — D84821 Immunodeficiency due to drugs: Secondary | ICD-10-CM | POA: Diagnosis not present

## 2022-12-27 DIAGNOSIS — C7800 Secondary malignant neoplasm of unspecified lung: Secondary | ICD-10-CM | POA: Diagnosis present

## 2022-12-27 DIAGNOSIS — K319 Disease of stomach and duodenum, unspecified: Secondary | ICD-10-CM | POA: Diagnosis present

## 2022-12-27 DIAGNOSIS — C159 Malignant neoplasm of esophagus, unspecified: Secondary | ICD-10-CM | POA: Diagnosis not present

## 2022-12-27 DIAGNOSIS — Z791 Long term (current) use of non-steroidal anti-inflammatories (NSAID): Secondary | ICD-10-CM

## 2022-12-27 DIAGNOSIS — Z7901 Long term (current) use of anticoagulants: Secondary | ICD-10-CM

## 2022-12-27 DIAGNOSIS — D509 Iron deficiency anemia, unspecified: Secondary | ICD-10-CM | POA: Diagnosis not present

## 2022-12-27 DIAGNOSIS — E162 Hypoglycemia, unspecified: Secondary | ICD-10-CM | POA: Diagnosis not present

## 2022-12-27 DIAGNOSIS — J690 Pneumonitis due to inhalation of food and vomit: Secondary | ICD-10-CM | POA: Diagnosis present

## 2022-12-27 DIAGNOSIS — K224 Dyskinesia of esophagus: Secondary | ICD-10-CM | POA: Diagnosis not present

## 2022-12-27 DIAGNOSIS — Z87891 Personal history of nicotine dependence: Secondary | ICD-10-CM

## 2022-12-27 DIAGNOSIS — Z89612 Acquired absence of left leg above knee: Secondary | ICD-10-CM

## 2022-12-27 DIAGNOSIS — C772 Secondary and unspecified malignant neoplasm of intra-abdominal lymph nodes: Secondary | ICD-10-CM | POA: Diagnosis present

## 2022-12-27 DIAGNOSIS — Z885 Allergy status to narcotic agent status: Secondary | ICD-10-CM

## 2022-12-27 DIAGNOSIS — Z66 Do not resuscitate: Secondary | ICD-10-CM | POA: Diagnosis not present

## 2022-12-27 DIAGNOSIS — E872 Acidosis, unspecified: Secondary | ICD-10-CM | POA: Diagnosis not present

## 2022-12-27 DIAGNOSIS — Z515 Encounter for palliative care: Secondary | ICD-10-CM

## 2022-12-27 DIAGNOSIS — E874 Mixed disorder of acid-base balance: Secondary | ICD-10-CM | POA: Diagnosis not present

## 2022-12-27 DIAGNOSIS — R6889 Other general symptoms and signs: Secondary | ICD-10-CM | POA: Diagnosis not present

## 2022-12-27 DIAGNOSIS — Z4682 Encounter for fitting and adjustment of non-vascular catheter: Secondary | ICD-10-CM | POA: Diagnosis not present

## 2022-12-27 DIAGNOSIS — K861 Other chronic pancreatitis: Secondary | ICD-10-CM | POA: Diagnosis present

## 2022-12-27 DIAGNOSIS — R6520 Severe sepsis without septic shock: Secondary | ICD-10-CM | POA: Diagnosis not present

## 2022-12-27 DIAGNOSIS — R6521 Severe sepsis with septic shock: Secondary | ICD-10-CM | POA: Diagnosis present

## 2022-12-27 DIAGNOSIS — Z743 Need for continuous supervision: Secondary | ICD-10-CM | POA: Diagnosis not present

## 2022-12-27 DIAGNOSIS — R404 Transient alteration of awareness: Secondary | ICD-10-CM | POA: Diagnosis not present

## 2022-12-27 DIAGNOSIS — I700 Atherosclerosis of aorta: Secondary | ICD-10-CM | POA: Diagnosis not present

## 2022-12-27 DIAGNOSIS — Z811 Family history of alcohol abuse and dependence: Secondary | ICD-10-CM

## 2022-12-27 DIAGNOSIS — J9601 Acute respiratory failure with hypoxia: Secondary | ICD-10-CM | POA: Diagnosis not present

## 2022-12-27 DIAGNOSIS — Z7951 Long term (current) use of inhaled steroids: Secondary | ICD-10-CM

## 2022-12-27 DIAGNOSIS — G8929 Other chronic pain: Secondary | ICD-10-CM | POA: Diagnosis present

## 2022-12-27 DIAGNOSIS — Z79899 Other long term (current) drug therapy: Secondary | ICD-10-CM

## 2022-12-27 DIAGNOSIS — E876 Hypokalemia: Secondary | ICD-10-CM | POA: Diagnosis not present

## 2022-12-27 DIAGNOSIS — M069 Rheumatoid arthritis, unspecified: Secondary | ICD-10-CM | POA: Diagnosis present

## 2022-12-27 DIAGNOSIS — Z452 Encounter for adjustment and management of vascular access device: Secondary | ICD-10-CM | POA: Diagnosis not present

## 2022-12-27 DIAGNOSIS — I472 Ventricular tachycardia, unspecified: Secondary | ICD-10-CM | POA: Diagnosis not present

## 2022-12-27 DIAGNOSIS — I959 Hypotension, unspecified: Secondary | ICD-10-CM | POA: Diagnosis not present

## 2022-12-27 DIAGNOSIS — J9690 Respiratory failure, unspecified, unspecified whether with hypoxia or hypercapnia: Secondary | ICD-10-CM | POA: Diagnosis not present

## 2022-12-27 DIAGNOSIS — Z6820 Body mass index (BMI) 20.0-20.9, adult: Secondary | ICD-10-CM

## 2022-12-27 DIAGNOSIS — E8721 Acute metabolic acidosis: Secondary | ICD-10-CM | POA: Diagnosis not present

## 2022-12-27 DIAGNOSIS — I2510 Atherosclerotic heart disease of native coronary artery without angina pectoris: Secondary | ICD-10-CM | POA: Diagnosis not present

## 2022-12-27 DIAGNOSIS — I517 Cardiomegaly: Secondary | ICD-10-CM | POA: Diagnosis not present

## 2022-12-27 DIAGNOSIS — J189 Pneumonia, unspecified organism: Secondary | ICD-10-CM | POA: Diagnosis present

## 2022-12-27 DIAGNOSIS — R0602 Shortness of breath: Secondary | ICD-10-CM | POA: Diagnosis not present

## 2022-12-27 DIAGNOSIS — R008 Other abnormalities of heart beat: Secondary | ICD-10-CM | POA: Diagnosis not present

## 2022-12-27 DIAGNOSIS — Z8616 Personal history of COVID-19: Secondary | ICD-10-CM

## 2022-12-27 DIAGNOSIS — R918 Other nonspecific abnormal finding of lung field: Secondary | ICD-10-CM | POA: Diagnosis not present

## 2022-12-27 DIAGNOSIS — R627 Adult failure to thrive: Secondary | ICD-10-CM | POA: Diagnosis present

## 2022-12-27 DIAGNOSIS — I252 Old myocardial infarction: Secondary | ICD-10-CM

## 2022-12-27 DIAGNOSIS — Z7189 Other specified counseling: Secondary | ICD-10-CM | POA: Diagnosis not present

## 2022-12-27 DIAGNOSIS — R1310 Dysphagia, unspecified: Secondary | ICD-10-CM | POA: Diagnosis not present

## 2022-12-27 DIAGNOSIS — Z8673 Personal history of transient ischemic attack (TIA), and cerebral infarction without residual deficits: Secondary | ICD-10-CM

## 2022-12-27 DIAGNOSIS — Z8249 Family history of ischemic heart disease and other diseases of the circulatory system: Secondary | ICD-10-CM

## 2022-12-27 DIAGNOSIS — C801 Malignant (primary) neoplasm, unspecified: Secondary | ICD-10-CM | POA: Diagnosis not present

## 2022-12-27 DIAGNOSIS — I499 Cardiac arrhythmia, unspecified: Secondary | ICD-10-CM | POA: Diagnosis not present

## 2022-12-27 LAB — CBC WITH DIFFERENTIAL/PLATELET
Abs Immature Granulocytes: 0.1 10*3/uL — ABNORMAL HIGH (ref 0.00–0.07)
Basophils Absolute: 0.1 10*3/uL (ref 0.0–0.1)
Basophils Relative: 1 %
Eosinophils Absolute: 0.1 10*3/uL (ref 0.0–0.5)
Eosinophils Relative: 0 %
HCT: 37.5 % — ABNORMAL LOW (ref 39.0–52.0)
Hemoglobin: 11.3 g/dL — ABNORMAL LOW (ref 13.0–17.0)
Immature Granulocytes: 1 %
Lymphocytes Relative: 18 %
Lymphs Abs: 3.2 10*3/uL (ref 0.7–4.0)
MCH: 28.2 pg (ref 26.0–34.0)
MCHC: 30.1 g/dL (ref 30.0–36.0)
MCV: 93.5 fL (ref 80.0–100.0)
Monocytes Absolute: 0.9 10*3/uL (ref 0.1–1.0)
Monocytes Relative: 5 %
Neutro Abs: 13.5 10*3/uL — ABNORMAL HIGH (ref 1.7–7.7)
Neutrophils Relative %: 75 %
Platelets: 112 10*3/uL — ABNORMAL LOW (ref 150–400)
RBC: 4.01 MIL/uL — ABNORMAL LOW (ref 4.22–5.81)
RDW: 18.2 % — ABNORMAL HIGH (ref 11.5–15.5)
WBC: 17.8 10*3/uL — ABNORMAL HIGH (ref 4.0–10.5)
nRBC: 0 % (ref 0.0–0.2)

## 2022-12-27 LAB — GLUCOSE, CAPILLARY
Glucose-Capillary: 254 mg/dL — ABNORMAL HIGH (ref 70–99)
Glucose-Capillary: 284 mg/dL — ABNORMAL HIGH (ref 70–99)
Glucose-Capillary: 192 mg/dL — ABNORMAL HIGH (ref 70–99)

## 2022-12-27 LAB — CBG MONITORING, ED
Glucose-Capillary: 36 mg/dL — CL (ref 70–99)
Glucose-Capillary: 79 mg/dL (ref 70–99)
Glucose-Capillary: 122 mg/dL — ABNORMAL HIGH (ref 70–99)

## 2022-12-27 LAB — I-STAT VENOUS BLOOD GAS, ED
Acid-base deficit: 6 mmol/L — ABNORMAL HIGH (ref 0.0–2.0)
Acid-base deficit: 6 mmol/L — ABNORMAL HIGH (ref 0.0–2.0)
Bicarbonate: 15.2 mmol/L — ABNORMAL LOW (ref 20.0–28.0)
Bicarbonate: 16.8 mmol/L — ABNORMAL LOW (ref 20.0–28.0)
Calcium, Ion: 0.92 mmol/L — ABNORMAL LOW (ref 1.15–1.40)
Calcium, Ion: 0.95 mmol/L — ABNORMAL LOW (ref 1.15–1.40)
HCT: 34 % — ABNORMAL LOW (ref 39.0–52.0)
HCT: 35 % — ABNORMAL LOW (ref 39.0–52.0)
Hemoglobin: 11.6 g/dL — ABNORMAL LOW (ref 13.0–17.0)
Hemoglobin: 11.9 g/dL — ABNORMAL LOW (ref 13.0–17.0)
O2 Saturation: 100 %
O2 Saturation: 99 %
Potassium: 5 mmol/L (ref 3.5–5.1)
Potassium: 5.4 mmol/L — ABNORMAL HIGH (ref 3.5–5.1)
Sodium: 138 mmol/L (ref 135–145)
Sodium: 139 mmol/L (ref 135–145)
TCO2: 16 mmol/L — ABNORMAL LOW (ref 22–32)
TCO2: 18 mmol/L — ABNORMAL LOW (ref 22–32)
pCO2, Ven: 18.7 mmHg — CL (ref 44–60)
pCO2, Ven: 24.7 mmHg — ABNORMAL LOW (ref 44–60)
pH, Ven: 7.441 — ABNORMAL HIGH (ref 7.25–7.43)
pH, Ven: 7.519 — ABNORMAL HIGH (ref 7.25–7.43)
pO2, Ven: 116 mmHg — ABNORMAL HIGH (ref 32–45)
pO2, Ven: 207 mmHg — ABNORMAL HIGH (ref 32–45)

## 2022-12-27 LAB — TROPONIN I (HIGH SENSITIVITY): Troponin I (High Sensitivity): 97 ng/L — ABNORMAL HIGH (ref <18)

## 2022-12-27 LAB — I-STAT CHEM 8, ED
BUN: 35 mg/dL — ABNORMAL HIGH (ref 8–23)
Calcium, Ion: 0.94 mmol/L — ABNORMAL LOW (ref 1.15–1.40)
Chloride: 115 mmol/L — ABNORMAL HIGH (ref 98–111)
Creatinine, Ser: 2.1 mg/dL — ABNORMAL HIGH (ref 0.61–1.24)
Glucose, Bld: 58 mg/dL — ABNORMAL LOW (ref 70–99)
HCT: 35 % — ABNORMAL LOW (ref 39.0–52.0)
Hemoglobin: 11.9 g/dL — ABNORMAL LOW (ref 13.0–17.0)
Potassium: 5 mmol/L (ref 3.5–5.1)
Sodium: 139 mmol/L (ref 135–145)
TCO2: 18 mmol/L — ABNORMAL LOW (ref 22–32)

## 2022-12-27 LAB — ECHOCARDIOGRAM COMPLETE
AR max vel: 1.75 cm2
AV Area VTI: 2.25 cm2
AV Area mean vel: 1.74 cm2
AV Mean grad: 6 mmHg
AV Peak grad: 11.6 mmHg
Ao pk vel: 1.7 m/s
Area-P 1/2: 2.77 cm2
S' Lateral: 3.5 cm

## 2022-12-27 LAB — COMPREHENSIVE METABOLIC PANEL
ALT: 28 U/L (ref 0–44)
AST: 53 U/L — ABNORMAL HIGH (ref 15–41)
Albumin: 2.3 g/dL — ABNORMAL LOW (ref 3.5–5.0)
Alkaline Phosphatase: 222 U/L — ABNORMAL HIGH (ref 38–126)
Anion gap: 12 (ref 5–15)
BUN: 28 mg/dL — ABNORMAL HIGH (ref 8–23)
CO2: 15 mmol/L — ABNORMAL LOW (ref 22–32)
Calcium: 7.9 mg/dL — ABNORMAL LOW (ref 8.9–10.3)
Chloride: 112 mmol/L — ABNORMAL HIGH (ref 98–111)
Creatinine, Ser: 2.09 mg/dL — ABNORMAL HIGH (ref 0.61–1.24)
GFR, Estimated: 35 mL/min — ABNORMAL LOW (ref >60)
Glucose, Bld: 56 mg/dL — ABNORMAL LOW (ref 70–99)
Potassium: 4.7 mmol/L (ref 3.5–5.1)
Sodium: 139 mmol/L (ref 135–145)
Total Bilirubin: 0.6 mg/dL (ref 0.3–1.2)
Total Protein: 5.5 g/dL — ABNORMAL LOW (ref 6.5–8.1)

## 2022-12-27 LAB — LACTIC ACID, PLASMA: Lactic Acid, Venous: 2.6 mmol/L (ref 0.5–1.9)

## 2022-12-27 LAB — MRSA NEXT GEN BY PCR, NASAL: MRSA by PCR Next Gen: NOT DETECTED

## 2022-12-27 LAB — AMMONIA: Ammonia: 31 umol/L (ref 9–35)

## 2022-12-27 MED ORDER — CLOPIDOGREL BISULFATE 75 MG PO TABS
75.0000 mg | ORAL_TABLET | Freq: Every day | ORAL | Status: DC
  Administered 2022-12-28 – 2023-01-01 (×5): 75 mg via ORAL
  Filled 2022-12-27 (×5): qty 1

## 2022-12-27 MED ORDER — HEPARIN SODIUM (PORCINE) 5000 UNIT/ML IJ SOLN: 5000.0000 [IU] | Freq: Three times a day (TID) | INTRAMUSCULAR | Status: DC

## 2022-12-27 MED ORDER — DEXTROSE 10 % IV SOLN: INTRAVENOUS | Status: DC

## 2022-12-27 MED ORDER — SODIUM CHLORIDE 0.9 % IV SOLN
1.0000 g | Freq: Once | INTRAVENOUS | Status: AC
  Administered 2022-12-27: 1 g via INTRAVENOUS
  Filled 2022-12-27: qty 10

## 2022-12-27 MED ORDER — DEXTROSE 50 % IV SOLN: 1.0000 | Freq: Once | INTRAVENOUS | Status: DC

## 2022-12-27 MED ORDER — NOREPINEPHRINE 4 MG/250ML-% IV SOLN: 2.0000 ug/min | INTRAVENOUS | Status: DC

## 2022-12-27 MED ORDER — NOREPINEPHRINE 4 MG/250ML-% IV SOLN
INTRAVENOUS | Status: AC
  Administered 2022-12-27: 5 ug/min via INTRAVENOUS
  Filled 2022-12-27: qty 250

## 2022-12-27 MED ORDER — SODIUM CHLORIDE 0.9 % IV SOLN: 250.0000 mL | INTRAVENOUS | Status: DC | PRN

## 2022-12-27 MED ORDER — ALBUMIN HUMAN 25 % IV SOLN
25.0000 g | Freq: Once | INTRAVENOUS | Status: AC
  Administered 2022-12-27: 25 g via INTRAVENOUS
  Filled 2022-12-27: qty 100

## 2022-12-27 MED ORDER — SODIUM CHLORIDE 0.9 % IV SOLN
500.0000 mg | Freq: Once | INTRAVENOUS | Status: AC
  Administered 2022-12-27: 500 mg via INTRAVENOUS
  Filled 2022-12-27: qty 5

## 2022-12-27 MED ORDER — DEXTROSE-SODIUM CHLORIDE 5-0.45 % IV SOLN: INTRAVENOUS | Status: DC

## 2022-12-27 MED ORDER — SODIUM CHLORIDE 0.9 % IV BOLUS
2000.0000 mL | Freq: Once | INTRAVENOUS | Status: AC
  Administered 2022-12-27: 2000 mL via INTRAVENOUS

## 2022-12-27 MED ORDER — SODIUM CHLORIDE 0.9% FLUSH
3.0000 mL | Freq: Two times a day (BID) | INTRAVENOUS | Status: DC
  Administered 2022-12-27 – 2023-01-01 (×10): 3 mL via INTRAVENOUS

## 2022-12-27 MED ORDER — POLYETHYLENE GLYCOL 3350 17 G PO PACK: 17.0000 g | PACK | Freq: Every day | ORAL | Status: DC | PRN

## 2022-12-27 MED ORDER — APIXABAN 5 MG PO TABS
5.0000 mg | ORAL_TABLET | Freq: Two times a day (BID) | ORAL | Status: DC
  Administered 2022-12-28 – 2023-01-01 (×9): 5 mg via ORAL
  Filled 2022-12-27 (×9): qty 1

## 2022-12-27 MED ORDER — SODIUM CHLORIDE 0.9 % IV SOLN
250.0000 mL | INTRAVENOUS | Status: DC
  Administered 2022-12-27: 250 mL via INTRAVENOUS

## 2022-12-27 MED ORDER — SODIUM CHLORIDE 0.9 % IV SOLN
100.0000 mg | Freq: Two times a day (BID) | INTRAVENOUS | Status: DC
  Administered 2022-12-28 – 2023-01-01 (×8): 100 mg via INTRAVENOUS
  Filled 2022-12-27 (×9): qty 100

## 2022-12-27 MED ORDER — DEXTROSE 50 % IV SOLN
INTRAVENOUS | Status: AC
  Administered 2022-12-27: 50 mL
  Filled 2022-12-27: qty 50

## 2022-12-27 MED ORDER — NOREPINEPHRINE 16 MG/250ML-% IV SOLN
0.0000 ug/min | INTRAVENOUS | Status: DC
  Administered 2022-12-27 (×3): 20 ug/min via INTRAVENOUS
  Administered 2022-12-28: 24 ug/min via INTRAVENOUS
  Filled 2022-12-27 (×2): qty 250

## 2022-12-27 MED ORDER — DOCUSATE SODIUM 100 MG PO CAPS: 100.0000 mg | ORAL_CAPSULE | Freq: Two times a day (BID) | ORAL | Status: DC | PRN

## 2022-12-27 MED ORDER — VANCOMYCIN VARIABLE DOSE PER UNSTABLE RENAL FUNCTION (PHARMACIST DOSING): Status: DC

## 2022-12-27 MED ORDER — SODIUM CHLORIDE 0.9 % IV SOLN
2.0000 g | INTRAVENOUS | Status: DC
  Administered 2022-12-28 – 2022-12-31 (×4): 2 g via INTRAVENOUS
  Filled 2022-12-27 (×4): qty 20

## 2022-12-27 MED ORDER — ATORVASTATIN CALCIUM 40 MG PO TABS: 40.0000 mg | ORAL_TABLET | Freq: Every day | ORAL | Status: DC

## 2022-12-27 MED ORDER — SODIUM CHLORIDE 0.9% FLUSH: 3.0000 mL | INTRAVENOUS | Status: DC | PRN

## 2022-12-27 MED ORDER — INSULIN ASPART 100 UNIT/ML IJ SOLN
0.0000 [IU] | INTRAMUSCULAR | Status: DC
  Administered 2022-12-27: 5 [IU] via SUBCUTANEOUS
  Administered 2022-12-27 – 2022-12-28 (×2): 2 [IU] via SUBCUTANEOUS

## 2022-12-27 MED ORDER — VANCOMYCIN HCL 1250 MG/250ML IV SOLN
1250.0000 mg | Freq: Once | INTRAVENOUS | Status: AC
  Administered 2022-12-27: 1250 mg via INTRAVENOUS
  Filled 2022-12-27: qty 250

## 2022-12-27 MED ORDER — AMIODARONE HCL 200 MG PO TABS
200.0000 mg | ORAL_TABLET | Freq: Two times a day (BID) | ORAL | Status: DC
  Administered 2022-12-28 – 2023-01-01 (×9): 200 mg via ORAL
  Filled 2022-12-27 (×9): qty 1

## 2022-12-27 MED ORDER — VASOPRESSIN 20 UNITS/100 ML INFUSION FOR SHOCK
0.0000 [IU]/min | INTRAVENOUS | Status: DC
  Administered 2022-12-27 – 2022-12-28 (×2): 0.03 [IU]/min via INTRAVENOUS
  Filled 2022-12-27 (×2): qty 100

## 2022-12-27 NOTE — Progress Notes (Signed)
  Echocardiogram 2D Echocardiogram has been performed.  [name] [name] 12/27/2022, 5:49 PM

## 2022-12-27 NOTE — H&P (Addendum)
NAME:  [name] [name], MRN:  [id number], DOB:  10/31/1960, LOS: 0 ADMISSION DATE:  12/27/2022 CONSULTATION DATE:  12/27/2022 REFERRING MD:  Anitra Lauth - EDP CHIEF COMPLAINT:  Sepsis, likely aspiration PNA   History of Present Illness:  62 year old man who presented to [facility] ED 7/3 after presumed syncopal event. 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 Recently seen in Heme/Onc clinic 6/26 and declined chemotherapy that day due to weakness; rescheduled for week of admission.  History is obtained from chart review. Per report, patient was BIB EMS after a caregiver found patient with AMS after a presumed syncopal event. Patient was reportedly gray and had vomited; home was noted to be hot on EMS arrival. On EMS arrival, patient was hypotensive to 76/40, mildly hypoxic with SpO2 86% on RA/91% on 4LNC. Received NS bolus.  Received a total of 2L Fluids without appropriate response and subsequently started on Levophed.  Patient admitted to ICU for evaluation and management of septic shock.  Pertinent Medical History:   Past Medical History:  Diagnosis Date   Acute respiratory failure (HCC) 01/31/2022   Bilateral shoulder pain 08/20/2012   Bite wound of left hand 09/27/2021   CAD (coronary artery disease)    Occluded nondominent RCA managed medically - August 2023   Cardiac arrest [facility])    OOH VF - August 2023   Community acquired pneumonia 11/07/2011   Critical limb ischemia with history of revascularization of same extremity (HCC) 04/28/2020   DDD (degenerative disc disease), lumbosacral     and grade 2 slip   DVT (deep venous thrombosis) (HCC) 01/31/2022   Esophageal cancer (HCC)    Adenocarcinoma with metastasis   GERD (gastroesophageal reflux disease)    History  of anemia    History of COVID-19 05/10/2019   History of critical lower limb ischemia 02/19/2019   Hypertension    Impingement syndrome of shoulder region    Ischemic ulcer of toe of left foot (HCC)    Liver mass    Low back pain without sciatica    Lupus (HCC)    Multiple fractures of ribs, bilateral, due to CPR trauma    Onychomycosis    Paronychia of great toe    Peripheral vascular disease (HCC)    Pressure injury of skin    Pulmonary embolus (HCC)    RA (rheumatoid arthritis) (HCC)    Rotator cuff tear 11/04/2014   Sternal fracture    Stroke (HCC)    Significant Hospital Events: Including procedures, antibiotic start and stop dates in addition to other pertinent events   07/03: Started on pressors  Interim History / Subjective:  Patient evaluated bedside.  He states that he had syncopal episode at home today.  He was sitting down, and unsure what happened.  He states this was witnessed by his roommates [name] and [employer].  He states that he did hit his head, but unclear for how long he was down for.  He states over the past couple days he has been short of breath, and had a cough with gray sputum.  He is waxing and waning during my exam, not able to get full history.  Did call symptoms friends Olegario Messier and [name] who stated it was his normal state health until 2 days ago when he became very weak.  He deferred treatment with chemotherapy at that time.  This morning, patient was feeling weak.  This morning, [name] said at 10 AM she went to go check on him, and he was doing okay and was sleeping.  11 AM she came back to check on him, and he had vomited all over herself.  Patient states that he had a syncopal episode but [name] stated that he never had a syncopal event, but he was awake but unresponsive.  She then called 911 to bring him to the hospital.  Objective:  Blood pressure (!) 67/40, pulse (!) 51, temperature 100.3 F (37.9 C), temperature source Oral, resp. rate (!) 26, SpO2 97 %.        No intake or output data in the 24 hours ending 12/27/22 1525 There were no vitals filed for this visit.  Physical Examination: General: Chronically ill-appearing, somnolent HEENT: [place]/AT, dry mucous membranes Neuro: Able to follow commands but very decreased strength to bilateral upper and lower extremities 2/5.  CV: Distant heart sounds PULM: Labored breathing on nasal cannula.  Coarse breath sounds heard throughout.  Very decreased lung sounds noted to bilateral base GI: Normal active bowel sounds, soft Back: No wounds appreciated, erythematous rash appreciated Extremities: Left AKA appreciated. Skin: [facility] Problem List:    Assessment & Plan:  This is a 62 year old male with past medical history of hypertension, CAD, cardiac arrest, PAD, esophageal metastatic cancer who presents to the emergency department after vomiting and being found unresponsive.  Patient admitted for further evaluation and management of septic shock secondary to potential aspiration pneumonia.  #Sepsis #Septic shock #Altered mental status #Concern for aspiration pneumonia #Leukocytosis Patient presented to the emergency room with significantly decreased blood pressures.  Patient was not febrile, but temperature recorded 100.3 F.  Did not have clear history for suspected infection.  He did report a few day history of feeling bad, cough, shortness of breath, and with new episode of vomiting, do suspect aspiration pneumonia.  Given history of chemotherapy, do feel that patient is immunocompromise, and likely will need broad-spectrum antibiotics.  I do have some concern for possible etiology of cardiogenic shock given distant heart sounds on exam with EKG showing low QRS voltage.  Will obtain stat echocardiogram.  Do not think this is hypovolemic shock.  It is either cardiogenic versus distributive. -Start broad-spectrum antibiotics with vancomycin, ceftriaxone, azithromycin -Blood cultures  pending -Stat echo -Start Levophed and vasopressin with MAP goal of 65 or greater -Monitor fever curve -Sputum cultures -Monitor SpO2 to keep greater than 92%  #Syncope Patient did report having syncopal episode.[name] did not think patient had syncopal episode, but rather was unresponsive.  Unclear given history.  On exam, patient did have distant heart sounds, so I do have some suspicion of pericardial effusion especially with history of chemotherapy. -Stat echo pending -Monitor on telemetry  #AKI #Respiratory alkalosis with Metabolic acidosis  Patient's creatinine bumped up to 2.1 up from baseline around 0.9.  This is likely secondary to hypoperfusion likely causing ATN.  Patient does have decreased bicarb of 15.  Could be likely secondary to lactic acidosis.  Lactate elevated at 2.6. -Fluid resuscitate and start pressors as above  #CAD #History of cardiac arrest Home medication includes Plavix 75 mg daily and atorvastatin 40 mg daily. -Resume home Plavix -Resume home atorvastatin 40 mg daily -Troponins pending -Continue amiodarone 200 mg twice daily  #Hypertension Currently hypotensive.  Will hold home amlodipine 10 mg daily  #Metastatic esophageal cancer Patient was  due for chemotherapy on 12/25/2022.  Due to weakness patient missed this.  Will resume pain medication as patient's altered mental status improves. -Hold home meloxicam 7.5 mg daily -Hold home oxycodone 10 mg every 6 hours  #History of DVT/PE Patient is on Eliquis 5 mg twice daily at home.  Patient has been compliant with medication.  Low suspicion for PE given compliance. -Resume home Eliquis  Best Practice: (right click and "Reselect all SmartList Selections" daily)   Diet/type: NPO w/ oral meds DVT prophylaxis: DOAC GI prophylaxis: PPI Lines: [place]:  N/A Code Status:  full code Last date of multidisciplinary goals of care discussion  Labs:  CBC: Recent Labs  Lab 12/27/22 1334 12/27/22 1344  12/27/22 1411  WBC  --  17.8*  --   NEUTROABS  --  13.5*  --   HGB 11.9*  11.9* 11.3* 11.6*  HCT 35.0*  35.0* 37.5* 34.0*  MCV  --  93.5  --   PLT  --  112*  --    Basic Metabolic Panel: Recent Labs  Lab 12/27/22 1334 12/27/22 1344 12/27/22 1411  NA 139  139 139 138  K 5.0  5.0 4.7 5.4*  CL 115* 112*  --   CO2  --  15*  --   GLUCOSE 58* 56*  --   BUN 35* 28*  --   CREATININE 2.10* 2.09*  --   CALCIUM  --  7.9*  --    GFR: Estimated Creatinine Clearance: 32 mL/min (A) (by C-G formula based on SCr of 2.09 mg/dL (H)). Recent Labs  Lab 12/27/22 1340 12/27/22 1344  WBC  --  17.8*  LATICACIDVEN 2.6*  --    Liver Function Tests: Recent Labs  Lab 12/27/22 1344  AST 53*  ALT 28  ALKPHOS 222*  BILITOT 0.6  PROT 5.5*  ALBUMIN 2.3*   No results for input(s): "LIPASE", "AMYLASE" in the last 168 hours. Recent Labs  Lab 12/27/22 1342  AMMONIA 31   ABG:    Component Value Date/Time   PHART 7.388 02/01/2022 0134   PCO2ART 40.3 02/01/2022 0134   PO2ART 375 (H) 02/01/2022 0134   HCO3 15.2 (L) 12/27/2022 1411   TCO2 16 (L) 12/27/2022 1411   ACIDBASEDEF 6.0 (H) 12/27/2022 1411   O2SAT 100 12/27/2022 1411    Coagulation Profile: No results for input(s): "INR", "PROTIME" in the last 168 hours.  Cardiac Enzymes: No results for input(s): "CKTOTAL", "CKMB", "CKMBINDEX", "TROPONINI" in the last 168 hours.  HbA1C: Hgb A1c MFr Bld  Date/Time Value Ref Range Status  02/03/2022 03:27 AM 5.1 4.8 - 5.6 % Final    Comment:    (NOTE) Pre diabetes:          5.7%-6.4%  Diabetes:              >6.4%  Glycemic control for   <7.0% adults with diabetes   01/31/2022 01:06 PM 5.0 4.8 - 5.6 % Final    Comment:    (NOTE) Pre diabetes:          5.7%-6.4%  Diabetes:              >6.4%  Glycemic control for   <7.0% adults with diabetes    CBG: Recent Labs  Lab 12/27/22 1344 12/27/22 1406 12/27/22 1503  GLUCAP 36* 79 122*   Review of Systems:   Shortness of  breath and cough endorsed by patient.  Past Medical History:  He,  has a past medical history of  Acute respiratory failure (HCC) (01/31/2022), Bilateral shoulder pain (08/20/2012), Bite wound of left hand (09/27/2021), CAD (coronary artery disease), Cardiac arrest [facility]), Community acquired pneumonia (11/07/2011), Critical limb ischemia with history of revascularization of same extremity (HCC) (04/28/2020), DDD (degenerative disc disease), lumbosacral, DVT (deep venous thrombosis) (HCC) (01/31/2022), Esophageal cancer (HCC), GERD (gastroesophageal reflux disease), History of anemia, History of COVID-19 (05/10/2019), History of critical lower limb ischemia (02/19/2019), Hypertension, Impingement syndrome of shoulder region, Ischemic ulcer of toe of left foot (HCC), Liver mass, Low back pain without sciatica, Lupus (HCC), Multiple fractures of ribs, bilateral, due to CPR trauma, Onychomycosis, Paronychia of great toe, Peripheral vascular disease (HCC), Pressure injury of skin, Pulmonary embolus (HCC), RA (rheumatoid arthritis) (HCC), Rotator cuff tear (11/04/2014), Sternal fracture, and Stroke (HCC).   Surgical History:   Past Surgical History:  Procedure Laterality Date   ABDOMINAL AORTAGRAM  02/20/2019   ABDOMINAL AORTOGRAM W/LOWER EXTREMITY N/A 02/20/2019   Procedure: ABDOMINAL AORTOGRAM W/LOWER EXTREMITY;  Surgeon: Cephus Shelling, MD;  [facility];  Service: Cardiovascular;  Laterality: N/A;   ACHILLES TENDON SURGERY Bilateral    AMPUTATION Left 05/11/2020   Procedure: LEFT ABOVE KNEE AMPUTATION;  Surgeon: Chuck Hint, MD;  Location: [facility] OR;  Service: Vascular;  Laterality: Left;   ENDARTERECTOMY POPLITEAL Left 05/01/2020   Procedure: ENDARTERECTOMY POPLITEAL;  Surgeon: Maeola Harman, MD;  Location: [facility] OR;  Service: Vascular;  Laterality: Left;   FASCIOTOMY Left 05/01/2020   Procedure: FASCIOTOMY;  Surgeon: Maeola Harman, MD;  Location: [facility] OR;   Service: Vascular;  Laterality: Left;   FEMORAL-POPLITEAL BYPASS GRAFT Left 02/26/2019   Procedure: BYPASS GRAFT FEMORAL-POPLITEAL ARTERY LEFT LEG USING 6mm PROPATEN GRAFT;  Surgeon: Nada Libman, MD;  Location: [facility] OR;  Service: Vascular;  Laterality: Left;   INTRAOPERATIVE ARTERIOGRAM Left 01/22/2020   Procedure: INTRA OPERATIVE ARTERIOGRAM with administration of thrombolyics in Left femoral to popliteal bypass.;  Surgeon: Cephus Shelling, MD;  Location: [facility] OR;  Service: Vascular;  Laterality: Left;   IR IMAGING GUIDED PORT INSERTION  03/07/2022   LEFT HEART CATH AND CORONARY ANGIOGRAPHY N/A 03/15/2017   Procedure: LEFT HEART CATH AND CORONARY ANGIOGRAPHY;  Surgeon: Yvonne Kendall, MD;  [facility]: [facility];  Service: Cardiovascular;  Laterality: N/A;   LEFT HEART CATH AND CORONARY ANGIOGRAPHY N/A 02/06/2022   Procedure: LEFT HEART CATH AND CORONARY ANGIOGRAPHY;  Surgeon: Swaziland, Peter M, MD;  Location: [facility] INVASIVE CV LAB;  Service: Cardiovascular;  Laterality: N/A;   LOWER EXTREMITY INTERVENTION Left 04/29/2020   Procedure: LOWER EXTREMITY INTERVENTION- LYSIS;  Surgeon: Cephus Shelling, MD;  [facility];  Service: Cardiovascular;  Laterality: Left;   OSTEOTOMY AND ULNAR SHORTENING Right 07/22/2002   PATCH ANGIOPLASTY Left 05/01/2020   Procedure: PATCH ANGIOPLASTY USING HEMASHIELD PLATINUM FINESSE PATCH;  Surgeon: Maeola Harman, MD;  Location: [facility] OR;  Service: Vascular;  Laterality: Left;   PERIPHERAL VASCULAR ATHERECTOMY  01/23/2020   Procedure: PERIPHERAL VASCULAR ATHERECTOMY;  Surgeon: Cephus Shelling, MD;  [facility];  Service: Cardiovascular;;   PERIPHERAL VASCULAR BALLOON ANGIOPLASTY  01/23/2020   Procedure: PERIPHERAL VASCULAR BALLOON ANGIOPLASTY;  Surgeon: Cephus Shelling, MD;  [facility];  Service: Cardiovascular;;   PERIPHERAL VASCULAR INTERVENTION Left 04/30/2020   Procedure: PERIPHERAL VASCULAR  INTERVENTION;  Surgeon: Leonie Douglas, MD;  [facility];  Service: Cardiovascular;  Laterality: Left;   PERIPHERAL VASCULAR THROMBECTOMY N/A 01/23/2020   Procedure: lysis  recheck;  Surgeon: Cephus Shelling, MD;  Location: [facility] INVASIVE CV LAB;  Service: Cardiovascular;  Laterality: N/A;  + [place]    PERIPHERAL VASCULAR THROMBECTOMY N/A 04/30/2020   Procedure: Lysis Recheck;  Surgeon: Leonie Douglas, MD;  [facility];  Service: Cardiovascular;  Laterality: N/A;   SHOULDER ARTHROSCOPY WITH BICEPSTENOTOMY Right 03/11/2015   Procedure: SHOULDER ARTHROSCOPY WITH BICEPSTENOTOMY;  Surgeon: Loreta Ave, MD;  Location: [facility];  Service: Orthopedics;  Laterality: Right;   SHOULDER ARTHROSCOPY WITH DISTAL CLAVICLE RESECTION Left 11/19/2014   Procedure: SHOULDER ARTHROSCOPY WITH DISTAL CLAVICLE RESECTION;  Surgeon: Mckinley Jewel, MD;  Location: [facility];  Service: Orthopedics;  Laterality: Left;   SHOULDER ARTHROSCOPY WITH ROTATOR CUFF REPAIR AND SUBACROMIAL DECOMPRESSION Left 11/19/2014   Procedure: LEFT SHOULDER ARTHROSCOPY, DEBRIDEMENT DISTAL CLAVICLE EXCISION, ACROMIOPLASTY WITH ROTATOR CUFF REPAIR ;  Surgeon: Mckinley Jewel, MD;  Location: [facility];  Service: Orthopedics;  Laterality: Left;   THROMBECTOMY OF BYPASS GRAFT FEMORAL- POPLITEAL ARTERY Left 05/01/2020   Procedure: THROMBECTOMY OF LOWER EXTREMITY;  Surgeon: Maeola Harman, MD;  [facility];  Service: Vascular;  Laterality: Left;   TRANSFORAMINAL LUMBAR INTERBODY FUSION (TLIF) WITH PEDICLE SCREW FIXATION 1 LEVEL N/A 01/03/2018   Procedure: TRANSFORAMINAL LUMBAR INTERBODY FUSION (TLIF) LUMBAR FIVE-SACRAL ONE;  Surgeon: Venita Lick, MD;  [facility];  Service: Orthopedics;  Laterality: N/A;   ULTRASOUND GUIDANCE FOR VASCULAR ACCESS Right 01/22/2020   Procedure: ULTRASOUND GUIDANCE FOR VASCULAR ACCESS Right Common Femoral Artery.;   Surgeon: Cephus Shelling, MD;  Location: [facility] OR;  Service: Vascular;  Laterality: Right;   VIDEO ASSISTED THORACOSCOPY (VATS)/DECORTICATION Right 11/21/2011   drainage of empyema   Social History:   reports that he quit smoking about 3 years ago. His smoking use included cigarettes. He has a 5.00 pack-year smoking history. He has never used smokeless tobacco. He reports that he does not drink alcohol and does not use drugs.   Family History:  His family history includes Alcohol abuse in his father; Aneurysm in his mother; Heart attack (age of onset: 3) in his father. There is no history of Stroke or Cancer.   Allergies: Allergies  Allergen Reactions   Dilaudid [Hydromorphone Hcl] Rash   Ramipril Rash and Other (See Comments)    Per patient on R arm and leg only; no angioedema   Tramadol Itching and Rash   Home Medications: Prior to Admission medications   Medication Sig Start Date End Date Taking? Authorizing Provider  albuterol (VENTOLIN HFA) 108 (90 Base) MCG/ACT inhaler Inhale 2 puffs into the lungs every 6 (six) hours as needed for wheezing or shortness of breath. 11/30/22   Ladene Artist, MD  amiodarone (PACERONE) 200 MG tablet Take 1 tablet (200 mg total) by mouth 2 (two) times daily. 09/27/22 09/27/23  Adron Bene, MD  amLODipine (NORVASC) 10 MG tablet Take 1 tablet (10 mg total) by mouth daily. 08/09/22 11/30/22  Carrion-Carrero, Karle Starch, MD  apixaban (ELIQUIS) 5 MG TABS tablet Take 1 tablet (5 mg total) by mouth 2 (two) times daily. 11/30/22 12/30/22  Rocky Morel, DO  atorvastatin (LIPITOR) 40 MG tablet Take 1 tablet (40 mg total) by mouth daily at 6 PM. 09/14/22 11/30/22  Rocky Morel, DO  clopidogrel (PLAVIX) 75 MG tablet Take 75 mg by mouth daily. 09/14/22   [provider]  diclofenac Sodium (VOLTAREN) 1 % GEL Apply 2 g topically 4 (four) times daily. 08/09/22   Carrion-Carrero, Karle Starch,  MD  DULoxetine (CYMBALTA) 30 MG capsule Take 30 mg by mouth daily as needed  (sleep). 06/21/22   [provider]  feeding supplement (ENSURE ENLIVE / ENSURE PLUS) LIQD Take 237 mLs by mouth 3 (three) times daily between meals. 08/09/22   Carrion-Carrero, Karle Starch, MD  ferrous sulfate 325 (65 FE) MG tablet Take 1 tablet (325 mg total) by mouth daily with breakfast for 100 doses. 11/30/22 03/10/23  Rocky Morel, DO  fluticasone-salmeterol (ADVAIR HFA) [phone] MCG/ACT inhaler Inhale 2 puffs into the lungs 2 (two) times daily. 10/03/22   Adron Bene, MD  folic acid (FOLVITE) 1 MG tablet Take 1 tablet (1 mg total) by mouth daily. 05/16/22   Rocky Morel, DO  gabapentin (NEURONTIN) 300 MG capsule Take 1 capsule (300 mg total) by mouth 2 (two) times daily. Daily or twice daily if tolerated. 11/09/22   Rocky Morel, DO  isosorbide mononitrate (IMDUR) 30 MG 24 hr tablet Take 1 tablet (30 mg total) by mouth daily. 11/29/22 12/29/22  Rocky Morel, DO  lidocaine-prilocaine (EMLA) cream Apply 1 Application topically as directed. Apply to port site 2 hours prior to stick and cover with Press-and-Seal to numb port before access 05/29/22   Ladene Artist, MD  loperamide (IMODIUM A-D) 2 MG tablet Take 2 mg by mouth 4 (four) times daily as needed for diarrhea or loose stools.    [provider]  meloxicam (MOBIC) 7.5 MG tablet Take 1 tablet (7.5 mg total) by mouth daily as needed for pain. 11/30/22 03/30/23  Rocky Morel, DO  methotrexate (RHEUMATREX) 2.5 MG tablet Take 3 tablets (7.5 mg total) by mouth once a week. Caution:Chemotherapy. Protect from light. Patient not taking: Reported on 11/30/2022 10/17/22   Rocky Morel, DO  ondansetron (ZOFRAN) 8 MG tablet START TAKING 72 HOURS AFTER CHEMOTHERAPY TREATMENT. 12/15/22   Ladene Artist, MD  Oxycodone HCl 10 MG TABS Take 10 mg by mouth every 6 (six) hours. 09/14/22   [provider]  potassium chloride (KLOR-CON M10) 10 MEQ tablet TAKE 2 TABLETS(20 MEQ) BY MOUTH DAILY 10/19/22   Ladene Artist, MD   prochlorperazine (COMPAZINE) 10 MG tablet TAKE 1 TABLET (10 MG TOTAL) BY MOUTH EVERY 6 (SIX) HOURS AS NEEDED. 11/27/22   Ladene Artist, MD   Critical care time:   The patient is critically ill with multiple organ system failure and requires high complexity decision making for assessment and support, frequent evaluation and titration of therapies, advanced monitoring, review of radiographic studies and interpretation of complex data.   Critical Care Time devoted to patient care services, exclusive of separately billable procedures, described in this note is 45 minutes.  Modena Slater, DO  Internal Medicine Resident PGY-2 12/27/22 3:25 PM  Please see [email or web address] for pager details.  From 7A-7P if no response, please call [phone] After hours, please call ELink [phone]

## 2022-12-27 NOTE — ED Notes (Signed)
X-ray at bedside

## 2022-12-27 NOTE — Progress Notes (Addendum)
eLink Physician-Brief Progress Note Patient Name: [name] [name] DOB: February 27, 1961 MRN: [id number]   Date of Service  12/27/2022  HPI/Events of Note  Patient on D 10 % water at 150 ml / hour to treat earlier hypoglycemia  (down to blood sugar of 30), blood sugar is currently 200's. Patient is also oliguric.  eICU Interventions  D 10 discontinued. D 5 % 1/2 NS at 50 ml / hour substituted, In / Out bladder catheter protocol ordered.        Thomasene Lot [name] 12/27/2022, 8:20 PM

## 2022-12-27 NOTE — Progress Notes (Signed)
Came bedside for echo twice, but patient is not available.    Please contact on call cardiology for stat echos needed after 5pm.

## 2022-12-27 NOTE — Progress Notes (Signed)
Pharmacy Antibiotic Note  [name] [name] is a 62 y.o. male with esophageal cancer admitted on 12/27/2022 with pneumonia and sepsis.  Pharmacy has been consulted for vancomycin dosing. Also on ceftriaxone and received azithromycin in ED. Continuing on doxycycline due to prolonged QT.   AKI - SCr up 2.09 (baseline 0.9-1). WBC elevated 17.8. Tm 100.3.  Note history of klebsiella bacteremia in 04/2022 which was resistant to ampicillin only otherwise sensitive.  Patient is receiving chemotherapy for esophageal cancer - last dose 11/30/22.   Plan: Vancomycin 1250 mg IV x1, then check level at 24 hours and reassess renal function in setting of AKI and low body weight.  Ceftriaxone 2g IV every 24 hours per MD Doxycyline 100mg  IV every 12 hours per MD Consider if need to further broaden with immunocompromised state     Temp (24hrs), Avg:100.3 F (37.9 C), Min:100.3 F (37.9 C), Max:100.3 F (37.9 C)  Recent Labs  Lab 12/27/22 1334 12/27/22 1340 12/27/22 1344  WBC  --   --  17.8*  CREATININE 2.10*  --  2.09*  LATICACIDVEN  --  2.6*  --     Estimated Creatinine Clearance: 32 mL/min (A) (by C-G formula based on SCr of 2.09 mg/dL (H)).    Allergies  Allergen Reactions   Dilaudid [Hydromorphone Hcl] Rash   Ramipril Rash and Other (See Comments)    Per patient on R arm and leg only; no angioedema   Tramadol Itching and Rash    Antimicrobials this admission: Ceftriaxone 7/3 >> Azith 7/3 x1 Doxycycline 7/4 >> Vancomycin 7/4 >>  Dose adjustments this admission:  Microbiology results: pending  Thank you for allowing pharmacy to be a part of this patient's care.  Link Snuffer, PharmD, BCPS, BCCCP Clinical Pharmacist 12/27/2022 3:35 PM

## 2022-12-27 NOTE — ED Notes (Signed)
Port has been accessed. 

## 2022-12-27 NOTE — ED Provider Notes (Signed)
[place] EMERGENCY DEPARTMENT AT [facility] Provider Note   CSN: [id number] Arrival date & time: 12/27/22  1259     History  No chief complaint on file.   [name] [name] is a 62 y.o. male, hx of cardiac arrest, metastatic esophageal cancer, who presents to the ED 2/2 to being followed altered this a.m.  His caregiver [name] [name], found him today, and he appeared very gray, and vomited on himself.  Notes that he feels very short of breath, and sick.  He states he is a full code.  Missed his chemotherapy for his esophageal  cancer yesterday.  Sister [name] [name] [phone]     Home Medications Prior to Admission medications   Medication Sig Start Date End Date Taking? Authorizing Provider  albuterol (VENTOLIN HFA) 108 (90 Base) MCG/ACT inhaler Inhale 2 puffs into the lungs every 6 (six) hours as needed for wheezing or shortness of breath. 11/30/22   Ladene Artist, MD  amiodarone (PACERONE) 200 MG tablet Take 1 tablet (200 mg total) by mouth 2 (two) times daily. 09/27/22 09/27/23  Adron Bene, MD  amLODipine (NORVASC) 10 MG tablet Take 1 tablet (10 mg total) by mouth daily. 08/09/22 11/30/22  Carrion-Carrero, Karle Starch, MD  apixaban (ELIQUIS) 5 MG TABS tablet Take 1 tablet (5 mg total) by mouth 2 (two) times daily. 11/30/22 12/30/22  Rocky Morel, DO  atorvastatin (LIPITOR) 40 MG tablet Take 1 tablet (40 mg total) by mouth daily at 6 PM. 09/14/22 11/30/22  Rocky Morel, DO  clopidogrel (PLAVIX) 75 MG tablet Take 75 mg by mouth daily. 09/14/22   [provider]  diclofenac Sodium (VOLTAREN) 1 % GEL Apply 2 g topically 4 (four) times daily. 08/09/22   Carrion-Carrero, Karle Starch, MD  DULoxetine (CYMBALTA) 30 MG capsule Take 30 mg by mouth daily as needed (sleep). 06/21/22   [provider]  feeding supplement (ENSURE ENLIVE / ENSURE PLUS) LIQD Take 237 mLs by mouth 3 (three) times daily between meals. 08/09/22   Carrion-Carrero, Karle Starch, MD  ferrous sulfate 325 (65 FE) MG tablet Take  1 tablet (325 mg total) by mouth daily with breakfast for 100 doses. 11/30/22 03/10/23  Rocky Morel, DO  fluticasone-salmeterol (ADVAIR HFA) [phone] MCG/ACT inhaler Inhale 2 puffs into the lungs 2 (two) times daily. 10/03/22   Adron Bene, MD  folic acid (FOLVITE) 1 MG tablet Take 1 tablet (1 mg total) by mouth daily. 05/16/22   Rocky Morel, DO  gabapentin (NEURONTIN) 300 MG capsule Take 1 capsule (300 mg total) by mouth 2 (two) times daily. Daily or twice daily if tolerated. 11/09/22   Rocky Morel, DO  isosorbide mononitrate (IMDUR) 30 MG 24 hr tablet Take 1 tablet (30 mg total) by mouth daily. 11/29/22 12/29/22  Rocky Morel, DO  lidocaine-prilocaine (EMLA) cream Apply 1 Application topically as directed. Apply to port site 2 hours prior to stick and cover with Press-and-Seal to numb port before access 05/29/22   Ladene Artist, MD  loperamide (IMODIUM A-D) 2 MG tablet Take 2 mg by mouth 4 (four) times daily as needed for diarrhea or loose stools.    [provider]  meloxicam (MOBIC) 7.5 MG tablet Take 1 tablet (7.5 mg total) by mouth daily as needed for pain. 11/30/22 03/30/23  Rocky Morel, DO  methotrexate (RHEUMATREX) 2.5 MG tablet Take 3 tablets (7.5 mg total) by mouth once a week. Caution:Chemotherapy. Protect from light. Patient not taking: Reported on 11/30/2022 10/17/22   Rocky Morel, DO  ondansetron [facility]) 8 MG  tablet START TAKING 72 HOURS AFTER CHEMOTHERAPY TREATMENT. 12/15/22   Ladene Artist, MD  Oxycodone HCl 10 MG TABS Take 10 mg by mouth every 6 (six) hours. 09/14/22   [provider]  potassium chloride (KLOR-CON M10) 10 MEQ tablet TAKE 2 TABLETS(20 MEQ) BY MOUTH DAILY 10/19/22   Ladene Artist, MD  prochlorperazine (COMPAZINE) 10 MG tablet TAKE 1 TABLET (10 MG TOTAL) BY MOUTH EVERY 6 (SIX) HOURS AS NEEDED. 11/27/22   Ladene Artist, MD      Allergies    Dilaudid [hydromorphone hcl], Ramipril, and Tramadol    Review of Systems   Review of Systems   Constitutional:  Negative for fever.  Respiratory:  Positive for shortness of breath.   Gastrointestinal:  Positive for nausea and vomiting.    Physical Exam Updated Vital Signs BP (!) 84/50   Pulse 61   Temp 100.3 F (37.9 C) (Oral)   Resp (!) 22   SpO2 91%  Physical Exam Vitals and nursing note reviewed.  Constitutional:      General: He is not in acute distress.    Appearance: He is well-developed.     Comments: Chronically ill-appearing  HENT:     Head: Normocephalic and atraumatic.  Eyes:     Conjunctiva/sclera: Conjunctivae normal.  Cardiovascular:     Rate and Rhythm: Regular rhythm. Bradycardia present.     Heart sounds: No murmur heard. Pulmonary:     Effort: Tachypnea present.     Breath sounds: No stridor.     Comments: Crackles right lower lobe Abdominal:     Palpations: Abdomen is soft.     Tenderness: There is no abdominal tenderness.  Musculoskeletal:        General: No swelling.     Cervical back: Neck supple.  Skin:    General: Skin is dry.     Capillary Refill: Capillary refill takes less than 2 seconds.     Comments: +mottled skin  Neurological:     Mental Status: He is alert.  Psychiatric:        Mood and Affect: Mood normal.     ED Results / Procedures / Treatments   Labs (all labs ordered are listed, but only abnormal results are displayed) Labs Reviewed  CBC WITH DIFFERENTIAL/PLATELET - Abnormal; Notable for the following components:      Result Value   WBC 17.8 (*)    RBC 4.01 (*)    Hemoglobin 11.3 (*)    HCT 37.5 (*)    RDW 18.2 (*)    Platelets 112 (*)    Neutro Abs 13.5 (*)    Abs Immature Granulocytes 0.10 (*)    All other components within normal limits  I-STAT CHEM 8, ED - Abnormal; Notable for the following components:   Chloride 115 (*)    BUN 35 (*)    Creatinine, Ser 2.10 (*)    Glucose, Bld 58 (*)    Calcium, Ion 0.94 (*)    TCO2 18 (*)    Hemoglobin 11.9 (*)    HCT 35.0 (*)    All other components within  normal limits  I-STAT VENOUS BLOOD GAS, ED - Abnormal; Notable for the following components:   pH, Ven 7.441 (*)    pCO2, Ven 24.7 (*)    pO2, Ven 116 (*)    Bicarbonate 16.8 (*)    TCO2 18 (*)    Acid-base deficit 6.0 (*)    Calcium, Ion 0.95 (*)    HCT  35.0 (*)    Hemoglobin 11.9 (*)    All other components within normal limits  CBG MONITORING, ED - Abnormal; Notable for the following components:   Glucose-Capillary 36 (*)    All other components within normal limits  I-STAT VENOUS BLOOD GAS, ED - Abnormal; Notable for the following components:   pH, Ven 7.519 (*)    pCO2, Ven 18.7 (*)    pO2, Ven 207 (*)    Bicarbonate 15.2 (*)    TCO2 16 (*)    Acid-base deficit 6.0 (*)    Potassium 5.4 (*)    Calcium, Ion 0.92 (*)    HCT 34.0 (*)    Hemoglobin 11.6 (*)    All other components within normal limits  COMPREHENSIVE METABOLIC PANEL  URINALYSIS, ROUTINE W REFLEX MICROSCOPIC  LACTIC ACID, PLASMA  LACTIC ACID, PLASMA  AMMONIA  BLOOD GAS, VENOUS  CBG MONITORING, ED  CBG MONITORING, ED  CBG MONITORING, ED  TROPONIN I (HIGH SENSITIVITY)    EKG EKG Interpretation Date/Time:  Wednesday December 27 2022 13:31:19 EDT Ventricular Rate:  55 PR Interval:    QRS Duration:  114 QT Interval:  526 QTC Calculation: 504 R Axis:   146  Text Interpretation: Sinus rhythm Left posterior fascicular block Low voltage, extremity and precordial leads Consider anterior infarct Prolonged QT interval No significant change since last tracing Confirmed by Gwyneth Sprout ([id number]) on 12/27/2022 1:41:00 PM  Radiology DG Chest Port 1 View  Result Date: 12/27/2022 CLINICAL DATA:  Confusion EXAM: PORTABLE CHEST 1 VIEW COMPARISON:  12/17/2022 FINDINGS: Right chest port with catheter tip overlying the superior cavoatrial junction. Unchanged cardiac and mediastinal contours. Aortic atherosclerosis. Slightly increased interstitial opacities. No pleural effusion or pneumothorax. No acute osseous abnormality.  IMPRESSION: Slightly increased interstitial opacities, which may reflect mild pulmonary edema. Electronically Signed   By: Wiliam Ke M.D.   On: 12/27/2022 13:41    Procedures Procedures    Medications Ordered in ED Medications  azithromycin (ZITHROMAX) 500 mg in sodium chloride 0.9 % 250 mL IVPB (500 mg Intravenous New Bag/Given 12/27/22 1409)  dextrose 50 % solution 50 mL ( Intravenous Canceled Entry 12/27/22 1355)  sodium chloride flush (NS) 0.9 % injection 3 mL (3 mLs Intravenous Given 12/27/22 1353)  sodium chloride flush (NS) 0.9 % injection 3 mL (has no administration in time range)  0.9 %  sodium chloride infusion (has no administration in time range)  0.9 %  sodium chloride infusion (250 mLs Intravenous New Bag/Given 12/27/22 1413)  norepinephrine (LEVOPHED) 4mg  in (0.016 mg/mL) premix infusion (5 mcg/min Intravenous New Bag/Given 12/27/22 1354)  dextrose 10 % infusion ( Intravenous Rate/Dose Change 12/27/22 1407)  sodium chloride 0.9 % bolus 2,000 mL (0 mLs Intravenous Stopped 12/27/22 1353)  cefTRIAXone (ROCEPHIN) 1 g in sodium chloride 0.9 % 100 mL IVPB (1 g Intravenous New Bag/Given 12/27/22 1408)  dextrose 50 % solution (50 mLs  Given 12/27/22 1350)    ED Course/ Medical Decision Making/ A&P                             Medical Decision Making Patient is a 62 year old male, here for AMS, vomitus this morning.  That he states this feels very short of breath and site.  Missed his chemotherapy for esophageal cancer.  He is ill-appearing, with some confusion, and mottled skin.  He is requiring oxygen, typically is not on any oxygen.  Blood pressure is 60/40.  We will start him on 2 L bolus, IV fluids, to see if his pressure improves ribs, as well as having obtained multiple labs, to evaluate possible etiology.  Discharged Friday call Dr. Anitra Lauth given hypotension, she evaluated at bedside, elevated recommendations, help manage, appreciate her considerable help.    Amount and/or  Complexity of Data Reviewed Labs: ordered.    Details: Leukocytosis of 17,000, hypercarbic respiratory failure and glucose of 36, and  creatinine 2.1  Radiology: ordered.    Details: Chest x-ray shows interstitial opacities  Discussion of management or test interpretation with external provider(s): Discussed with patient, he is relying on vasopressors,for blood pressure.  I believe he is septic from pneumonia, given his hypotension, leukocytosis, and now he has a temperature of 100.3.  After vasopressors, his blood pressure improved from 60/40, to 110/96.  He is feeling better and mentation is improving, his skin looks less mottled.  I spoke with ICU and they accept admission of patient.  He also has an AKI of 2.1, previous 0.97 days ago.  Risk Prescription drug management. Decision regarding hospitalization.   CRITICAL CARE Performed by: Pete Pelt   Total critical care time: 35 minutes  Critical care time was exclusive of separately billable procedures and treating other patients.  Critical care was necessary to treat or prevent imminent or life-threatening deterioration.  Critical care was time spent personally by me on the following activities: development of treatment plan with patient and/or surrogate as well as nursing, discussions with consultants, evaluation of patient's response to treatment, examination of patient, obtaining history from patient or surrogate, ordering and performing treatments and interventions, ordering and review of laboratory studies, ordering and review of radiographic studies, pulse oximetry and re-evaluation of patient's condition.  Final Clinical Impression(s) / ED Diagnoses Final diagnoses:  Hypoglycemia  AKI (acute kidney injury) (HCC)  Pneumonia due to infectious organism, unspecified laterality, unspecified part of lung  Metabolic acidosis  Sepsis with acute hypoxic respiratory failure and septic shock, due to unspecified organism [facility])     Rx / DC Orders ED Discharge Orders     None         Darrelyn Morro, [place]ley [place], PA 12/27/22 1442    Gwyneth Sprout, MD 12/28/22 [phone]

## 2022-12-27 NOTE — ED Notes (Signed)
Sats low, RN IP at bedside

## 2022-12-27 NOTE — ED Notes (Signed)
ICU providers are setting up to insert A-Line at this time.

## 2022-12-27 NOTE — ED Notes (Signed)
Pt finished receiving the full liter of NS from EMS now.

## 2022-12-27 NOTE — ED Triage Notes (Signed)
Pt BIB [facility] from home d/t a friend calling about pt had a syncopal event & vomited. Hx of lung & liver CA missed his last chemo Tx. EMS reports this home was very hot with no AC & pt was hypotensive at 76/40. He received 700 cc NS while en route to ED in 20g Lt wrist. CBG 84, resp 30, 86% on RA & 91% on 4L, a/ox4 at baseline but is confused to time & event now.

## 2022-12-27 NOTE — Hospital Course (Addendum)
Patient missed his chemotherapy appointment yesterday.  Patient had syncopal event.   TemP 100.79F, Pulse 56-61, respirations 29-36, blood pressure: 84/58, 60/40, 114/99.  Received ceftriaxone, azithromycin Bolus 2000 mL Remained hypotensive, started Levophed  Blood gas: pH 7.44, pCO2 24, bicarb 16.8 CBC white count 17.8, hemoglobin 11.3, platelets 112  pH 7.519, pCO2 18.7, bicarb 15.2  Home meds:  Albuterol Amiodarone 200 mg twice daily Amlodipine 10 mg daily Eliquis 5 mg twice daily Atorvastatin 40 mg daily Plavix 75 mg daily Duloxetine 30 mg daily Ferrous sulfate 325 mg daily Advair Folic acid daily Gabapentin 300 mg twice daily Imdur 30 mg daily Meloxicam 7.5 mg daily. Zofran 8 mg daily Oxycodone 10 mg every 6 hours Potassium chloride 20 mg once daily Compazine 10 mg every 6 hours  Chest x-ray: Slightly increased interstitial opacities reflecting mild pulmonary edema.  EKG: Sinus rhythm, Rate of 55  ...  7/7: He reports he is feeling well overall outside of back pain, which he regularly has. He reports improved SOB. He denies abdominal pain, productive cough. He has not yet seen PT.  Will be scheduling oxy q6h. Encouraged pt to move more and work with PT.  PE: Pt is laying down in bed with the head of the bed elevated. No edema in bilateral lower extremities; warm and dry.

## 2022-12-28 ENCOUNTER — Inpatient Hospital Stay (HOSPITAL_COMMUNITY): Payer: 59

## 2022-12-28 DIAGNOSIS — A419 Sepsis, unspecified organism: Secondary | ICD-10-CM | POA: Diagnosis not present

## 2022-12-28 DIAGNOSIS — R6521 Severe sepsis with septic shock: Secondary | ICD-10-CM | POA: Diagnosis not present

## 2022-12-28 LAB — GLUCOSE, CAPILLARY
Glucose-Capillary: 105 mg/dL — ABNORMAL HIGH (ref 70–99)
Glucose-Capillary: 118 mg/dL — ABNORMAL HIGH (ref 70–99)
Glucose-Capillary: 161 mg/dL — ABNORMAL HIGH (ref 70–99)
Glucose-Capillary: 100 mg/dL — ABNORMAL HIGH (ref 70–99)
Glucose-Capillary: 51 mg/dL — ABNORMAL LOW (ref 70–99)
Glucose-Capillary: 79 mg/dL (ref 70–99)
Glucose-Capillary: 106 mg/dL — ABNORMAL HIGH (ref 70–99)

## 2022-12-28 LAB — MAGNESIUM: Magnesium: 1.4 mg/dL — ABNORMAL LOW (ref 1.7–2.4)

## 2022-12-28 LAB — CBC
HCT: 37.7 % — ABNORMAL LOW (ref 39.0–52.0)
Hemoglobin: 11.8 g/dL — ABNORMAL LOW (ref 13.0–17.0)
MCH: 28.4 pg (ref 26.0–34.0)
MCHC: 31.3 g/dL (ref 30.0–36.0)
MCV: 90.6 fL (ref 80.0–100.0)
Platelets: 168 10*3/uL (ref 150–400)
RBC: 4.16 MIL/uL — ABNORMAL LOW (ref 4.22–5.81)
RDW: 18.1 % — ABNORMAL HIGH (ref 11.5–15.5)
WBC: 23.4 10*3/uL — ABNORMAL HIGH (ref 4.0–10.5)
nRBC: 0 % (ref 0.0–0.2)

## 2022-12-28 LAB — COMPREHENSIVE METABOLIC PANEL
ALT: 49 U/L — ABNORMAL HIGH (ref 0–44)
AST: 94 U/L — ABNORMAL HIGH (ref 15–41)
Albumin: 2.6 g/dL — ABNORMAL LOW (ref 3.5–5.0)
Alkaline Phosphatase: 202 U/L — ABNORMAL HIGH (ref 38–126)
Anion gap: 10 (ref 5–15)
BUN: 27 mg/dL — ABNORMAL HIGH (ref 8–23)
CO2: 16 mmol/L — ABNORMAL LOW (ref 22–32)
Calcium: 7.4 mg/dL — ABNORMAL LOW (ref 8.9–10.3)
Chloride: 110 mmol/L (ref 98–111)
Creatinine, Ser: 1.49 mg/dL — ABNORMAL HIGH (ref 0.61–1.24)
GFR, Estimated: 53 mL/min — ABNORMAL LOW (ref >60)
Glucose, Bld: 159 mg/dL — ABNORMAL HIGH (ref 70–99)
Potassium: 3.7 mmol/L (ref 3.5–5.1)
Sodium: 136 mmol/L (ref 135–145)
Total Bilirubin: 0.9 mg/dL (ref 0.3–1.2)
Total Protein: 5.8 g/dL — ABNORMAL LOW (ref 6.5–8.1)

## 2022-12-28 LAB — RESPIRATORY PANEL BY PCR

## 2022-12-28 LAB — URINALYSIS, ROUTINE W REFLEX MICROSCOPIC
Bilirubin Urine: NEGATIVE
Glucose, UA: NEGATIVE mg/dL
Hgb urine dipstick: NEGATIVE
Ketones, ur: NEGATIVE mg/dL
Leukocytes,Ua: NEGATIVE
Nitrite: NEGATIVE
Protein, ur: NEGATIVE mg/dL
Specific Gravity, Urine: 1.019 (ref 1.005–1.030)
pH: 5 (ref 5.0–8.0)

## 2022-12-28 LAB — SARS CORONAVIRUS 2 BY RT PCR: SARS Coronavirus 2 by RT PCR: NEGATIVE

## 2022-12-28 LAB — CK: Total CK: 767 U/L — ABNORMAL HIGH (ref 49–397)

## 2022-12-28 LAB — LACTIC ACID, PLASMA: Lactic Acid, Venous: 2.5 mmol/L (ref 0.5–1.9)

## 2022-12-28 LAB — PHOSPHORUS: Phosphorus: 2.8 mg/dL (ref 2.5–4.6)

## 2022-12-28 LAB — TROPONIN I (HIGH SENSITIVITY): Troponin I (High Sensitivity): 133 ng/L (ref <18)

## 2022-12-28 MED ORDER — ASPIRIN 81 MG PO CHEW
81.0000 mg | CHEWABLE_TABLET | Freq: Every day | ORAL | Status: DC
  Administered 2022-12-28: 81 mg via ORAL
  Filled 2022-12-28: qty 1

## 2022-12-28 MED ORDER — CHLORHEXIDINE GLUCONATE CLOTH 2 % EX PADS
6.0000 | MEDICATED_PAD | Freq: Every day | CUTANEOUS | Status: DC
  Administered 2022-12-28 – 2023-01-01 (×5): 6 via TOPICAL

## 2022-12-28 MED ORDER — OXYCODONE HCL 5 MG PO TABS
10.0000 mg | ORAL_TABLET | Freq: Four times a day (QID) | ORAL | Status: DC
  Filled 2022-12-28: qty 2

## 2022-12-28 MED ORDER — OXYCODONE HCL 5 MG PO TABS
10.0000 mg | ORAL_TABLET | Freq: Four times a day (QID) | ORAL | Status: DC | PRN
  Administered 2022-12-28 – 2022-12-30 (×7): 10 mg via ORAL
  Filled 2022-12-28 (×8): qty 2

## 2022-12-28 MED ORDER — MAGNESIUM SULFATE 4 GM/100ML IV SOLN
4.0000 g | Freq: Once | INTRAVENOUS | Status: AC
  Administered 2022-12-28: 4 g via INTRAVENOUS
  Filled 2022-12-28: qty 100

## 2022-12-28 MED ORDER — PROCHLORPERAZINE EDISYLATE 10 MG/2ML IJ SOLN
5.0000 mg | Freq: Four times a day (QID) | INTRAMUSCULAR | Status: DC | PRN
  Administered 2022-12-28: 5 mg via INTRAVENOUS
  Filled 2022-12-28 (×2): qty 1

## 2022-12-28 MED ORDER — POTASSIUM CHLORIDE 10 MEQ/50ML IV SOLN
10.0000 meq | INTRAVENOUS | Status: AC
  Administered 2022-12-28 (×4): 10 meq via INTRAVENOUS
  Filled 2022-12-28 (×4): qty 50

## 2022-12-28 NOTE — Progress Notes (Signed)
eLink Physician-Brief Progress Note Patient Name: [name] [name] DOB: [birth year] MRN: [id number]   Date of Service  12/28/2022  HPI/Events of Note  Troponin 133, EKG without acute ischemic changes.  eICU Interventions  Troponin elevation likely secondary to demand, trend Troponin, CPK and lactic acid. Echocardiogram this AM. Aspirin.        Thomasene Lot Otilia Kareem 12/28/2022, 2:38 AM

## 2022-12-28 NOTE — Progress Notes (Signed)
I called the patent's friend listed Cala Bradford who says she is his significant other but she does not live with him. Patient lives with [name] who is his caregiver.   The patient has a sister who is not very involved in his life and she thinks he doesn't know how sick he is.   I have encouraged Cala Bradford to come in tomorrow to see him and have a meeting with myself, the patient, palliative care, and hope that we can establish medical POA and some goals of care given his severe CAD, metastatic esophageal cancer (not currently well enough to tolerate salvage treatment)   He is more short of breath and now off pressors. Given chest xray findings and low normal lung function will diurese him to help hypoxemia. Respiratory virus panel and covid negative.    Additional cc time 35 minutes   Durel Salts, MD Pulmonary and Critical Care Medicine [facility] 12/28/2022 5:34 PM Pager: see AMION  If no response to pager, please call critical care on call (see AMION) until 7pm After 7:00 pm call Elink

## 2022-12-28 NOTE — Progress Notes (Signed)
[facility] ADULT ICU REPLACEMENT PROTOCOL   The patient does apply for the [facility] Adult ICU Electrolyte Replacment Protocol based on the criteria listed below:   1.Exclusion criteria: TCTS, ECMO, Dialysis, and Myasthenia Gravis patients 2. Is GFR >/= 30 ml/min? Yes.    Patient's GFR today is 53 3. Is SCr </= 2? Yes.   Patient's SCr is 1.49 mg/dL 4. Did SCr increase >/= 0.5 in 24 hours? No. 5.Pt's weight >40kg  Yes.   6. Abnormal electrolyte(s): mag 1.4, potassium 3.7  7. Electrolytes replaced per protocol 8.  Call MD STAT for K+ </= 2.5, Phos </= 1, or Mag </= 1 Physician:  protocol  Melvern Banker 12/28/2022 2:17 AM

## 2022-12-28 NOTE — Progress Notes (Signed)
NAME:  [name] [name], MRN:  [id number], DOB:  1960-07-22, LOS: 1 ADMISSION DATE:  12/27/2022 CONSULTATION DATE:  12/27/2022 REFERRING MD:  Anitra Lauth - EDP CHIEF COMPLAINT:  Sepsis, likely aspiration PNA   History of Present Illness:  62 year old man who presented to [facility] ED 7/3 after presumed syncopal event. 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 Recently seen in Heme/Onc clinic 6/26 and declined chemotherapy that day due to weakness; rescheduled for week of admission.  History is obtained from chart review. Per report, patient was BIB EMS after a caregiver found patient with AMS after a presumed syncopal event. Patient was reportedly gray and had vomited; home was noted to be hot on EMS arrival. On EMS arrival, patient was hypotensive to 76/40, mildly hypoxic with SpO2 86% on RA/91% on 4LNC. Received NS bolus.  Received a total of 2L Fluids without appropriate response and subsequently started on Levophed.  Patient admitted to ICU for evaluation and management of septic shock.  Pertinent Medical History:   Past Medical History:  Diagnosis Date   Acute respiratory failure (HCC) 01/31/2022   Bilateral shoulder pain 08/20/2012   Bite wound of left hand 09/27/2021   CAD (coronary artery disease)    Occluded nondominent RCA managed medically - August 2023   Cardiac arrest [facility])    OOH VF - August 2023   Community acquired pneumonia 11/07/2011   Critical limb ischemia with history of revascularization of same extremity (HCC) 04/28/2020   DDD (degenerative disc disease), lumbosacral     and grade 2 slip   DVT (deep venous thrombosis) (HCC) 01/31/2022   Esophageal cancer (HCC)    Adenocarcinoma with metastasis   GERD (gastroesophageal reflux disease)    History  of anemia    History of COVID-19 05/10/2019   History of critical lower limb ischemia 02/19/2019   Hypertension    Impingement syndrome of shoulder region    Ischemic ulcer of toe of left foot (HCC)    Liver mass    Low back pain without sciatica    Lupus (HCC)    Multiple fractures of ribs, bilateral, due to CPR trauma    Onychomycosis    Paronychia of great toe    Peripheral vascular disease (HCC)    Pressure injury of skin    Pulmonary embolus (HCC)    RA (rheumatoid arthritis) (HCC)    Rotator cuff tear 11/04/2014   Sternal fracture    Stroke (HCC)    Significant Hospital Events: Including procedures, antibiotic start and stop dates in addition to other pertinent events   07/03: Started on pressors  Interim History / Subjective:  This morning feeling a little better. Wants to eat.   Objective:  Blood pressure 92/66, pulse (!) 49, temperature 99.3 F (37.4 C), temperature source Oral, resp. rate 15, weight 67 kg, SpO2 (!) 89 %.        Intake/Output Summary (Last 24 hours) at 12/28/2022 0952 Last data filed at 12/28/2022 0800 Gross per 24 hour  Intake 3244.61 ml  Output 550 ml  Net 2694.61 ml   Filed Weights   12/28/22 0449  Weight: 67 kg    Physical Examination: Gen:     Appears older than state age, frail, laying flat HEENT:  dry mucus membranes Lungs:    no  wheezes or crackles CV:         RRR Abd:      Soft, nontender Ext:    No edema Skin:      Warm and dry; no rashes Neuro:   normal speech, moves all 4 extremities, weak but strength is preserved  Echo reviewed - LVEF 50%, moderately reduced RV function UA negative Covid negative Mrsa swab negative Chest xray read as acute pulmonary edema  Resolved Hospital Problem List:    Assessment & Plan:  This is a 62 year old male with past medical history of hypertension, CAD, cardiac arrest, PAD, esophageal metastatic cancer who presents to the emergency department after vomiting and being found unresponsive.   Patient admitted for further evaluation and management of septic shock secondary to potential aspiration pneumonia.  Septic shock Possible source - HAP given hypoxemia  -continue ceftriaxone/azithromycin, stop vancomycin given mrsa swap negative -Blood cultures pending, will send RVP -taper down Levophed and vasopressin with MAP goal of 65 or greater -Monitor fever curve -Sputum cultures  Acute hypoxemic respiratory failure -Monitor SpO2 to keep greater than 92% - currently on 10LNC, management of pna as above  ?syncope -Monitor on telemetry - query if he was having pre-syncope due to hypoxemia  Nausea/vomiting - possible viral? - prn compazine  Oliguric AKI Non gap metabolic acidosis Hypomagnesemia Likely pre-renal azotemia  but also having non gap component ?Diarrhea? - management of sepsis as above - replete electrolytes  #CAD, severe PVD #History of VT cardiac arrest Elevated troponin - type 2 nstemi -continue Plavix, amiodarone, atorvastatin 40 mg daily  #Hypertension Currently hypotensive.  Will hold home amlodipine 10 mg daily  #Metastatic esophageal cancer Patient was due for chemotherapy on 12/25/2022.  Due to weakness patient missed this.   -Hold home meloxicam 7.5 mg daily - resume as needed -Hold home oxycodone 10 mg every 6 hours - resume as needed  #History of DVT/PE Patient is on Eliquis 5 mg twice daily at home.  Patient has been compliant with medication.  Low suspicion for PE given compliance. Recent CTPE study negative.  -Resume home Eliquis    Best Practice: (right click and "Reselect all SmartList Selections" daily)   Diet/type: NPO w/ oral meds - can resume diet today DVT prophylaxis: DOAC GI prophylaxis: PPI Lines: [place]:  N/A Code Status:  full code Last date of multidisciplinary goals of care discussion  The patient is critically ill due to septic shock.  Critical care was necessary to treat or prevent imminent or  life-threatening deterioration.  Critical care was time spent personally by me on the following activities: development of treatment plan with patient and/or surrogate as well as nursing, discussions with consultants, evaluation of patient's response to treatment, examination of patient, obtaining history from patient or surrogate, ordering and performing treatments and interventions, ordering and review of laboratory studies, ordering and review of radiographic studies, pulse oximetry, re-evaluation of patient's condition and participation in multidisciplinary rounds.   Critical Care Time devoted to patient care services described in this note is 39 minutes. This time reflects time of care of this signee Charlott Holler . This critical care time does not reflect separately billable procedures or procedure time, teaching time or supervisory time of PA/NP/Med student/Med Resident etc but could involve care discussion time.       Charlott Holler [facility] 12/28/2022 9:53 AM  Pager: see AMION  If no response to pager , please call critical care on call (see  AMION) until 7pm After 7:00 pm call Elink

## 2022-12-29 DIAGNOSIS — J189 Pneumonia, unspecified organism: Secondary | ICD-10-CM | POA: Diagnosis not present

## 2022-12-29 DIAGNOSIS — Z515 Encounter for palliative care: Secondary | ICD-10-CM

## 2022-12-29 DIAGNOSIS — N179 Acute kidney failure, unspecified: Secondary | ICD-10-CM

## 2022-12-29 DIAGNOSIS — E872 Acidosis, unspecified: Secondary | ICD-10-CM | POA: Diagnosis not present

## 2022-12-29 DIAGNOSIS — Z7189 Other specified counseling: Secondary | ICD-10-CM

## 2022-12-29 LAB — GLUCOSE, CAPILLARY
Glucose-Capillary: 90 mg/dL (ref 70–99)
Glucose-Capillary: 114 mg/dL — ABNORMAL HIGH (ref 70–99)
Glucose-Capillary: 81 mg/dL (ref 70–99)
Glucose-Capillary: 76 mg/dL (ref 70–99)
Glucose-Capillary: 102 mg/dL — ABNORMAL HIGH (ref 70–99)

## 2022-12-29 LAB — BASIC METABOLIC PANEL
Anion gap: 8 (ref 5–15)
BUN: 19 mg/dL (ref 8–23)
CO2: 17 mmol/L — ABNORMAL LOW (ref 22–32)
Calcium: 8 mg/dL — ABNORMAL LOW (ref 8.9–10.3)
Chloride: 109 mmol/L (ref 98–111)
Creatinine, Ser: 1.19 mg/dL (ref 0.61–1.24)
GFR, Estimated: >60 mL/min (ref >60)
Glucose, Bld: 89 mg/dL (ref 70–99)
Potassium: 4.2 mmol/L (ref 3.5–5.1)
Sodium: 134 mmol/L — ABNORMAL LOW (ref 135–145)

## 2022-12-29 LAB — CULTURE, BLOOD (ROUTINE X 2)

## 2022-12-29 LAB — BRAIN NATRIURETIC PEPTIDE: B Natriuretic Peptide: 862.8 pg/mL — ABNORMAL HIGH (ref 0.0–100.0)

## 2022-12-29 MED ORDER — FUROSEMIDE 10 MG/ML IJ SOLN
40.0000 mg | Freq: Once | INTRAMUSCULAR | Status: AC
  Administered 2022-12-29: 40 mg via INTRAVENOUS
  Filled 2022-12-29: qty 4

## 2022-12-29 MED ORDER — INSULIN ASPART 100 UNIT/ML IJ SOLN: 0.0000 [IU] | Freq: Three times a day (TID) | INTRAMUSCULAR | Status: DC

## 2022-12-29 MED ORDER — LIDOCAINE 5 % EX PTCH
1.0000 | MEDICATED_PATCH | Freq: Every day | CUTANEOUS | Status: AC
  Administered 2022-12-29 – 2022-12-31 (×4): 1 via TRANSDERMAL
  Filled 2022-12-29 (×4): qty 1

## 2022-12-29 NOTE — Progress Notes (Signed)
Pt arrived from ..8M..., A/ox .3-4.Marland Kitchenpt denies any pain, MD aware,CCMD called. CHG bath given,no further needs at this time

## 2022-12-29 NOTE — Progress Notes (Addendum)
This chaplain responded to PMT NP-Michele consult for creating/updating the Pt. Advance Directive. The Pt. is eating lunch at the time of the visit along with planning to move to 4E07.   It time permits, the chaplain understands the Pt. is accepting of a visit later today.  **1325 This chaplain returned to the Pt. bedside. The Pt. appears to be sleeping, sitting up in the bed, when he responds to his name. The chaplain provided the Pt. with AD education. The Pt. clearly articulates his choice for HCPOA:  [facility]. The Pt. lacks clarity in defining the role of a HCPOA. The chaplain updated PMT NP-Michele.  The chaplain is available for F/U spiritual care as needed.  Chaplain Stephanie Acre [phone]

## 2022-12-29 NOTE — Progress Notes (Signed)
eLink Physician-Brief Progress Note Patient Name: [name] [name] DOB: 08-12-1960 MRN: [id number]   Date of Service  12/29/2022  HPI/Events of Note  Patient with sub-optimal pain control of chronic back pain.  eICU Interventions  Lidoderm patch ordered.        Tony Granquist U Ife Vitelli 12/29/2022, 1:00 AM

## 2022-12-29 NOTE — Progress Notes (Signed)
Patient admitted by critical care service for septic shock and acute hypoxemic respiratory failure 2/2 aspiration pneumonia. He has been off of pressors for 24 hrs and is being transferred out of the ICU. IMTS will assume care on 7/6 at 7 am.   Elza Rafter, DO Internal Medicine Resident, PGY-3

## 2022-12-29 NOTE — Consult Note (Cosign Needed Addendum)
Palliative Medicine Inpatient Consult Note  Consulting Provider:  Simonne Martinet, NP   Reason for consult:   Palliative Care Consult Services Palliative Medicine Consult   Symptom Management Consult  Reason for Consult? GOC   12/29/2022  HPI:  Per intake H&P --> 62 year old man who presented to [facility] ED 7/3 after presumed syncopal event. 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 Recently seen in Heme/Onc clinic 6/26 and declined chemotherapy that day due to weakness.  PMT asked to get involved for further GOC conversations as well as symptom management.   Clinical Assessment/Goals of Care:  *Please note that this is a verbal dictation therefore any spelling or grammatical errors are due to the "Dragon Medical One" system interpretation.  I have reviewed medical records including EPIC notes, labs and imaging, received report from bedside RN, assessed the patient.    I met with [name] [name] to further discuss diagnosis prognosis, GOC, EOL wishes, disposition and options.   I introduced Palliative Medicine as specialized medical care for people living with serious illness. It focuses on providing relief from the symptoms and stress of a serious illness. The goal is to improve quality of life for both the patient and the family.  Medical History Review and Understanding:  [name] [name] and I reviewed his past medical history inclusive of his coronary artery disease, prior cardiac arrest, peripheral arterial disease with left above-the-knee amputation, DVT, CVA, and esophageal cancer with metastasis to the lungs and liver.  Social History:  [name] [name] and I reviewed that he is from [facility].  He has never been married and has no children. [name] [name] has a long  time girlfriend, [name] [name] and has 2 very good friends [name] [name] and [name] [name] with whom he has been roommates for the past 12 years.  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  [name] [name] shares that he used to love fishing and hunting though has not been able to do that for the past 6 months.  [name] [name] is a man of faith though considers himself to be nondenominational.  Functional and Nutritional State:  Preceding hospitalization [name] [name] had recently been fitted for a new prosthetic limb and did have plans for a care aide to come 3 times a week to take him on walks.  Otherwise he has been able to self propel in a wheelchair.  He has a CNA come to help with bathing though has been able to dress himself.  [name] [name] shares he does have a good appetite and does recognize his aspirational event.  Palliative Symptoms:  Nausea - incrementally previously receiving Zofran. Exacerbated with chemo treatments.  Pain chronic (back) - followed by [facility] center previously receiving gabapentin 300mg  BID, Cymbalta 30mg  PO Qday, and oxycodone 10mg  PO Q6H PRN, and meloxicam 7.5mg  PO Qday PRN.  Weakness - Home aid to mobilize, new prosthetic.  Dyspnea - Present now, [name] [name] shares he does cough "occasionally" with eating at home.   Advance Directives:  A detailed discussion was had today regarding advanced directives.  [name] [name] does not have advanced directives though is interested in completing those during hospitalization.  He would like his friends [name] [name] and [name] [name] to be his surrogate decision makers  Code Status:  Concepts specific to  code status, artifical feeding and hydration, continued IV antibiotics and rehospitalization was had.  The difference between a aggressive medical intervention path  and a palliative comfort care path for this patient at this time was had.   Encouraged patient/family to consider DNR/DNI status  understanding evidenced based poor outcomes in similar hospitalized patient, as the cause of arrest is likely associated with advanced chronic/terminal illness rather than an easily reversible acute cardio-pulmonary event. I explained that DNR/DNI does not change the medical plan and it only comes into effect after a person has arrested (died).  It is a protective measure to keep [place] from harming the patient in their last moments of life.   [name] [name] does desire to remain full code full scope of care.  We tried to discuss the possible burdens of a cardiopulmonary resuscitation event in the setting of his already significant underlying metastatic esophageal cancer.  At this time though [name] [name] again vocalizes his desire to continue living.  Provided a copy of "Gone From My Site" booklet as well as a MOST form.  Discussion:  [name] [name] and I had an open and honest conversation in the setting of his cancer.  We reviewed that per recent notes it appears it was stable though I did voice concerns in the setting of potential obstruction.  We reviewed the plan for an esophagram for better characterization.  We also discussed ongoing speech therapy.  At this time decatur segrest the goals for improvement.  He would like to continue present measures for betterment of his health.  We did review best case and worst-case scenarios and I very gently broach the topic of hospice care should he not improve.  [name]'s goals are to go hunting and fishing again.  Discussed the importance of continued conversation with family and their  medical providers regarding overall plan of care and treatment options, ensuring decisions are within the context of the patients values and GOCs. ____________________________ Addendum:  I was able to call and speak to [name] [name]'s friend [name] [name] who does plan to come to the hospital over the weekend for [place] to have a sitdown conversation and complete a MOST form with 1 another.  [name] [name] shares he did  not know that he wanted [name] [name] to be his healthcare power of attorney but is in agreement with that.  Provided a copy of "Gone From My Site" booklet. _________________________________  Addendum #2:  I met with [name] [name], and [name] [name] this evening in the presence of [name] [name]. He did not recall meeting me earlier in the day. I was able to discuss with [name] [name]' loved ones the concern in the setting of [name] [name]' re-hospitalizations. We reviewed both his cardiac disease, pneumonia, and metastatic cancer. We discussed the concern overall in the long run as it relates to his disease burden.  We reviewed the importance of advance care planning. Patient remains to state the desire for [name] [name] and [name] [name] to be his primary decision makers.   At this point patient and his loved ones would like to hear from Dr. Myrle Sheng to better understand his prognosis. I shared my concern that we are likely looking at a span of months of life rather than years though [name] [name] expresses that he wants to continue living and remain on present interventions until unable.   We discussed if [name] [name] were to suffer another cardiac arrest how poorly he would fair. I strongly suggested DNAR/DNI. I also introduced the idea of hospice if [name] [name] gets to a point whereby he cannot continue on slavage  chemotherapy. [name] [name] and his loved ones would like to hear what Dr. Truett Perna thinks about code status as well as the idea of hospice. I shared that I would pass this along to him for further conversations on Monday.   Time: 45 additional minutes  Decision Maker: Shelton,Carey Clearence Cheek   [phone]   Shelton,Timothy Friend   [phone]   SUMMARY OF RECOMMENDATIONS   FULL CODE --> strongly recommended reviewing completion of the MOST form  Appreciate Dr. Kalman Drape input regarding prognosis in the setting of metastatic esophageal cancer  Appreciate Speech therapy involvement  Patient's goals are to continue current treatments and optimize his  current health state  Will refer to outpatient palliative clinic upon discharge  Ongoing palliative care support  Code Status/Advance Care Planning: FULL CODE  Symptom Management:  Nausea: - Continue compazine 5mg  IVP Q6H PRN  Pain: - Continue oxycodone 10mg  PO Q6H PRN - Can consider initiating gabapentin and cymbalta in the oncoming days  Weakness: - PT/OT Evaluations   Dysphagia: - Appreciate Speech involvement - Esophagram per primary  Palliative Prophylaxis:  Aspiration, Bowel Regimen, Delirium Protocol, Frequent Pain Assessment, Oral Care, Palliative Wound Care, and Turn Reposition  Additional Recommendations (Limitations, Scope, Preferences): Continue current care  Psycho-social/Spiritual:  Desire for further Chaplaincy support: Yes though nondenominational Additional Recommendations: Education on chronic disease and metastatic cancer   Prognosis: Worrisome overall - multiple chronic disease(s), metastatic cancer  Discharge Planning: Unclear at this time.   Vitals:   12/29/22 0930 12/29/22 0935  BP:  92/70  Pulse: 75 75  Resp: 16 18  Temp:    SpO2: 96% 95%    Intake/Output Summary (Last 24 hours) at 12/29/2022 1001 Last data filed at 12/29/2022 0500 Gross per 24 hour  Intake 1069.94 ml  Output 500 ml  Net 569.94 ml   Last Weight  Most recent update: 12/29/2022  4:06 AM    Weight  66.2 kg (145 lb 15.1 oz)            Gen:  Elderly Caucasian M in NAD HEENT: moist mucous membranes CV: Regular rate and rhythm  PULM:  On 8LPM HFNC ABD: soft/nontender  EXT: LLE AKA Neuro: Alert and oriented x3  PPS: 40%   This conversation/these recommendations were discussed with patient primary care team, Zenia Resides  Billing based on MDM: High  Problems Addressed: One acute or chronic illness or injury that poses a threat to life or bodily function  Amount and/or Complexity of Data: Category 3:Discussion of management or test interpretation with external  physician/other qualified health care professional/appropriate source (not separately reported)  Risks: Decision regarding hospitalization or escalation of hospital care and Decision not to resuscitate or to de-escalate care because of poor prognosis ______________________________________________________ Lamarr Lulas [facility] Team Team Cell Phone: [phone] Please utilize secure chat with additional questions, if there is no response within 30 minutes please call the above phone number  Palliative Medicine Team providers are available by phone from 7am to 7pm daily and can be reached through the team cell phone.  Should this patient require assistance outside of these hours, please call the patient's attending physician.

## 2022-12-29 NOTE — Progress Notes (Addendum)
NAME:  [name] [name], MRN:  [id number], DOB:  1961/03/27, LOS: 2 ADMISSION DATE:  12/27/2022 CONSULTATION DATE:  12/27/2022 REFERRING MD:  Anitra Lauth - EDP CHIEF COMPLAINT:  Sepsis, likely aspiration PNA   History of Present Illness:  62 year old man who presented to [facility] ED 7/3 after presumed syncopal event. 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 Recently seen in Heme/Onc clinic 6/26 and declined chemotherapy that day due to weakness; rescheduled for week of admission.  History is obtained from chart review. Per report, patient was BIB EMS after a caregiver found patient with AMS after a presumed syncopal event. Patient was reportedly gray and had vomited; home was noted to be hot on EMS arrival. On EMS arrival, patient was hypotensive to 76/40, mildly hypoxic with SpO2 86% on RA/91% on 4LNC. Received NS bolus.  Received a total of 2L Fluids without appropriate response and subsequently started on Levophed.  Patient admitted to ICU for evaluation and management of septic shock.  Pertinent Medical History:   Past Medical History:  Diagnosis Date   Acute respiratory failure (HCC) 01/31/2022   Bilateral shoulder pain 08/20/2012   Bite wound of left hand 09/27/2021   CAD (coronary artery disease)    Occluded nondominent RCA managed medically - August 2023   Cardiac arrest [facility])    OOH VF - August 2023   Community acquired pneumonia 11/07/2011   Critical limb ischemia with history of revascularization of same extremity (HCC) 04/28/2020   DDD (degenerative disc disease), lumbosacral     and grade 2 slip   DVT (deep venous thrombosis) (HCC) 01/31/2022   Esophageal cancer (HCC)    Adenocarcinoma with metastasis   GERD (gastroesophageal reflux disease)    History  of anemia    History of COVID-19 05/10/2019   History of critical lower limb ischemia 02/19/2019   Hypertension    Impingement syndrome of shoulder region    Ischemic ulcer of toe of left foot (HCC)    Liver mass    Low back pain without sciatica    Lupus (HCC)    Multiple fractures of ribs, bilateral, due to CPR trauma    Onychomycosis    Paronychia of great toe    Peripheral vascular disease (HCC)    Pressure injury of skin    Pulmonary embolus (HCC)    RA (rheumatoid arthritis) (HCC)    Rotator cuff tear 11/04/2014   Sternal fracture    Stroke (HCC)    Significant Hospital Events: Including procedures, antibiotic start and stop dates in addition to other pertinent events   07/03: Started on pressors 7/4 Echo reviewed - LVEF 50%, moderately reduced RV function. Off pressors 7/5 SLP eval for difficulty w/ PO intake. Remaining off pressors. Getting lasix. PT ordered. Transfer to floor. Palliative consulted.   Interim History / Subjective:   No distress. Still having episodes of vomiting   Objective:  Blood pressure 104/69, pulse 77, temperature 98.1 F (36.7 C), temperature source Oral, resp. rate (Abnormal) 22, weight 66.2 kg, SpO2 97 %.        Intake/Output Summary (Last 24 hours) at 12/29/2022 0754 Last data filed at 12/29/2022 0500 Gross per 24 hour  Intake 1410.89 ml  Output 500 ml  Net 910.89 ml   Filed Weights   12/28/22 0449 12/29/22  0405  Weight: 67 kg 66.2 kg    Physical Examination: General this is a chronically ill-appearing 62 year old male patient currently lying in bed he is in no acute distress HEENT normocephalic atraumatic no jugular venous distention appreciated pulmonary: Posterior rales, and liters high flow oxygen abdomen soft nontender still having episodes of nausea, and vomiting after taking orals Extremities warm dry, dependent edema in the right lower extremity has a left AKA Neuro awake oriented diffusely weak GU: Clear  yellow    Resolved Hospital Problem List:  Septic shock w/ lactic acidosis 2/2 PNA   Assessment & Plan:  This is a 62 year old male with past medical history of hypertension, CAD, cardiac arrest, PAD, esophageal metastatic cancer who presents to the emergency department after vomiting and being found unresponsive.  Patient admitted for further evaluation and management of septic shock secondary to potential aspiration pneumonia.   Acute hypoxemic respiratory failure 2/2 aspiration PNA +/- element of edema  -still requiring sig O2 10L HFO2 Plan Wean O2 Pulm hygiene  AM cxr IS  IV lasix today  SLP eval pending   ?syncope - query if he was having pre-syncope due to hypoxemia Plan Tele  Nausea/vomiting - viral eval neg. Getting SLP eval. I worry about perhaps obstruction d/t his esophageal cancer.  Plan PRN compazine  Getting slp eval  Will prob need esophagram  Oliguric AKI Non gap metabolic acidosis Scr had been improving. Bicarb stable  Plan Repeat chem today  Renal dose meds Avoid hypotension Cont strict I&O  CAD, severe PVD History of VT cardiac arrest Elevated troponin - type 2 nstemi Echo not really different from before  Plan continue Plavix, amiodarone, atorvastatin 40 mg daily  H/o Hypertension-->off NE since 7/3 & not hypertensive currently  Plan Hold amlodipine 10 mg daily  Metastatic esophageal cancer Patient was due for chemotherapy on 12/25/2022.  Due to weakness patient missed this.   Plan Holding home meloxicam 7.5 mg daily - will recheck chem. Would cont to hold until renal fxn normalized PRN oxycodone 10 mg every 6 hours  Consult palliative care  History of DVT/PE Plan Eliquis    Best Practice: (right click and "Reselect all SmartList Selections" daily)   Diet/type:  -reg diet  DVT prophylaxis: DOAC GI prophylaxis: PPI Lines: [place]:  N/A Code Status:  full code Last date of multidisciplinary goals of care discussion  Ready  to transfer to floor. IM to assume care  Simonne Martinet ACNP-BC [facility] Pulmonary/Critical Care Pager # [phone] OR # [phone] if no answer

## 2022-12-29 NOTE — Evaluation (Signed)
Clinical/Bedside Swallow Evaluation Patient Details  Name: [name] [name] MRN: [id number] Date of Birth: 1960/10/02  Today's Date: 12/29/2022 Time: SLP Start Time (ACUTE ONLY): 1317 SLP Stop Time (ACUTE ONLY): 1328 SLP Time Calculation (min) (ACUTE ONLY): 11 min  Past Medical History:  Past Medical History:  Diagnosis Date   Acute respiratory failure (HCC) 01/31/2022   Bilateral shoulder pain 08/20/2012   Bite wound of left hand 09/27/2021   CAD (coronary artery disease)    Occluded nondominent RCA managed medically - August 2023   Cardiac arrest [facility])    OOH VF - August 2023   Community acquired pneumonia 11/07/2011   Critical limb ischemia with history of revascularization of same extremity (HCC) 04/28/2020   DDD (degenerative disc disease), lumbosacral     and grade 2 slip   DVT (deep venous thrombosis) (HCC) 01/31/2022   Esophageal cancer (HCC)    Adenocarcinoma with metastasis   GERD (gastroesophageal reflux disease)    History of anemia    History of COVID-19 05/10/2019   History of critical lower limb ischemia 02/19/2019   Hypertension    Impingement syndrome of shoulder region    Ischemic ulcer of toe of left foot (HCC)    Liver mass    Low back pain without sciatica    Lupus (HCC)    Multiple fractures of ribs, bilateral, due to CPR trauma    Onychomycosis    Paronychia of great toe    Peripheral vascular disease (HCC)    Pressure injury of skin    Pulmonary embolus (HCC)    RA (rheumatoid arthritis) (HCC)    Rotator cuff tear 11/04/2014   Sternal fracture    Stroke [facility])    Past Surgical History:  Past Surgical History:  Procedure Laterality Date   ABDOMINAL AORTAGRAM  02/20/2019   ABDOMINAL AORTOGRAM W/LOWER EXTREMITY N/A 02/20/2019   Procedure: ABDOMINAL AORTOGRAM W/LOWER EXTREMITY;  Surgeon: Cephus Shelling, MD;  [facility]: [facility] IN[facility]IVE [facility];  Service: Cardiovascular;  Laterality: N/A;   ACHILLES TENDON SURGERY Bilateral    AMPUTATION Left  05/11/2020   Procedure: LEFT ABOVE KNEE AMPUTATION;  Surgeon: Chuck Hint, MD;  Location: [facility] OR;  Service: Vascular;  Laterality: Left;   ENDARTERECTOMY POPLITEAL Left 05/01/2020   Procedure: ENDARTERECTOMY POPLITEAL;  Surgeon: Maeola Harman, MD;  Location: [facility] OR;  Service: Vascular;  Laterality: Left;   FASCIOTOMY Left 05/01/2020   Procedure: FASCIOTOMY;  Surgeon: Maeola Harman, MD;  Location: [facility] OR;  Service: Vascular;  Laterality: Left;   FEMORAL-POPLITEAL BYPASS GRAFT Left 02/26/2019   Procedure: BYPASS GRAFT FEMORAL-POPLITEAL ARTERY LEFT LEG USING 6mm PROPATEN GRAFT;  Surgeon: Nada Libman, MD;  Location: [facility] OR;  Service: Vascular;  Laterality: Left;   INTRAOPERATIVE ARTERIOGRAM Left 01/22/2020   Procedure: INTRA OPERATIVE ARTERIOGRAM with administration of thrombolyics in Left femoral to popliteal bypass.;  Surgeon: Cephus Shelling, MD;  Location: [facility] OR;  Service: Vascular;  Laterality: Left;   IR IMAGING GUIDED PORT INSERTION  03/07/2022   LEFT HEART CATH AND CORONARY ANGIOGRAPHY N/A 03/15/2017   Procedure: LEFT HEART CATH AND CORONARY ANGIOGRAPHY;  Surgeon: Yvonne Kendall, MD;  Location: [facility];  Service: Cardiovascular;  Laterality: N/A;   LEFT HEART CATH AND CORONARY ANGIOGRAPHY N/A 02/06/2022   Procedure: LEFT HEART CATH AND CORONARY ANGIOGRAPHY;  Surgeon: Swaziland, Peter M, MD;  Location: [facility] INVASIVE CV LAB;  Service: Cardiovascular;  Laterality: N/A;   LOWER EXTREMITY INTERVENTION Left 04/29/2020   Procedure: LOWER EXTREMITY  INTERVENTION- LYSIS;  Surgeon: Cephus Shelling, MD;  [facility]: [facility];  Service: Cardiovascular;  Laterality: Left;   OSTEOTOMY AND ULNAR SHORTENING Right 07/22/2002   PATCH ANGIOPLASTY Left 05/01/2020   Procedure: PATCH ANGIOPLASTY USING HEMASHIELD PLATINUM FINESSE PATCH;  Surgeon: Maeola Harman, MD;  Location: [facility] OR;  Service: Vascular;  Laterality: Left;   PERIPHERAL VASCULAR ATHERECTOMY   01/23/2020   Procedure: PERIPHERAL VASCULAR ATHERECTOMY;  Surgeon: Cephus Shelling, MD;  Location: [facility];  Service: Cardiovascular;;   PERIPHERAL VASCULAR BALLOON ANGIOPLASTY  01/23/2020   Procedure: PERIPHERAL VASCULAR BALLOON ANGIOPLASTY;  Surgeon: Cephus Shelling, MD;  [facility]: [facility];  Service: Cardiovascular;;   PERIPHERAL VASCULAR INTERVENTION Left 04/30/2020   Procedure: PERIPHERAL VASCULAR INTERVENTION;  Surgeon: Leonie Douglas, MD;  [facility];  Service: Cardiovascular;  Laterality: Left;   PERIPHERAL VASCULAR THROMBECTOMY N/A 01/23/2020   Procedure: lysis recheck;  Surgeon: Cephus Shelling, MD;  [facility];  Service: Cardiovascular;  Laterality: N/A;  + [place]    PERIPHERAL VASCULAR THROMBECTOMY N/A 04/30/2020   Procedure: Lysis Recheck;  Surgeon: Leonie Douglas, MD;  Location: [facility];  Service: Cardiovascular;  Laterality: N/A;   SHOULDER ARTHROSCOPY WITH BICEPSTENOTOMY Right 03/11/2015   Procedure: SHOULDER ARTHROSCOPY WITH BICEPSTENOTOMY;  Surgeon: Loreta Ave, MD;  Location: [facility];  Service: Orthopedics;  Laterality: Right;   SHOULDER ARTHROSCOPY WITH DISTAL CLAVICLE RESECTION Left 11/19/2014   Procedure: SHOULDER ARTHROSCOPY WITH DISTAL CLAVICLE RESECTION;  Surgeon: Mckinley Jewel, MD;  Location: [facility];  Service: Orthopedics;  Laterality: Left;   SHOULDER ARTHROSCOPY WITH ROTATOR CUFF REPAIR AND SUBACROMIAL DECOMPRESSION Left 11/19/2014   Procedure: LEFT SHOULDER ARTHROSCOPY, DEBRIDEMENT DISTAL CLAVICLE EXCISION, ACROMIOPLASTY WITH ROTATOR CUFF REPAIR ;  Surgeon: Mckinley Jewel, MD;  Location: [facility];  Service: Orthopedics;  Laterality: Left;   THROMBECTOMY OF BYPASS GRAFT FEMORAL- POPLITEAL ARTERY Left 05/01/2020   Procedure: THROMBECTOMY OF LOWER EXTREMITY;  Surgeon: Maeola Harman, MD;  Location: [facility] OR;  Service: Vascular;   Laterality: Left;   TRANSFORAMINAL LUMBAR INTERBODY FUSION (TLIF) WITH PEDICLE SCREW FIXATION 1 LEVEL N/A 01/03/2018   Procedure: TRANSFORAMINAL LUMBAR INTERBODY FUSION (TLIF) LUMBAR FIVE-SACRAL ONE;  Surgeon: Venita Lick, MD;  Location: [facility] OR;  Service: Orthopedics;  Laterality: N/A;   ULTRASOUND GUIDANCE FOR VASCULAR ACCESS Right 01/22/2020   Procedure: ULTRASOUND GUIDANCE FOR VASCULAR ACCESS Right Common Femoral Artery.;  Surgeon: Cephus Shelling, MD;  Location: [facility] OR;  Service: Vascular;  Laterality: Right;   VIDEO ASSISTED THORACOSCOPY (VATS)/DECORTICATION Right 11/21/2011   drainage of empyema   HPI:  62 year old man who presented to [facility] ED 7/3 after presumed syncopal event. Pt with current esophageal CA (adenocarcinoma) with metastasis to lung and liver, on salvage therapy. Recently seen in Heme/Onc clinic 6/26 and declined chemotherapy that day due to weakness; rescheduled for week of admission.  Per report, patient was BIB EMS after a caregiver found patient with AMS after a presumed syncopal event. Patient was reportedly gray and had vomited; home was noted to be hot on EMS arrival. PMHx significant for HTN, CAD (medically managed, on Plavix), cardiac arrest (OOH VF 01/2022, on amiodarone), PAD with critical limb ischemia (s/p left AKA 2021), DVT/PE (on Eliquis), CVA,    Assessment / Plan / Recommendation  Clinical Impression  Pt demonstrates no immediate signs of dysphagia or aspiration. He does vaguely report throwing up today and RN and  suspected pt may be having trouble with food passing through esophagus. Pt was on a regular texture at the time. During this assessment he only wanted a few bites of pudding and isps of water, did not want solids. Might be best to implement a pureed texture at first with esophageal precautions and monitor for tolerance. Best assessment for this problem would be an esophagram. Posted precautions on door and spoke to RN. Will f/u next week for  advancement. SLP Visit Diagnosis: Dysphagia, unspecified (R13.10)    Aspiration Risk  Mild aspiration risk    Diet Recommendation Dysphagia 1 (Puree);Thin liquid    Liquid Administration via: Cup;Straw Medication Administration: Crushed with puree Supervision: Patient able to self feed Compensations: Slow rate;Small sips/bites;Follow solids with liquid Postural Changes: Seated upright at 90 degrees    Other  Recommendations Recommended Consults: Consider esophageal assessment    Recommendations for follow up therapy are one component of a multi-disciplinary discharge planning process, led by the attending physician.  Recommendations may be updated based on patient status, additional functional criteria and insurance authorization.  Follow up Recommendations Skilled nursing-short term rehab (<3 hours/day)      Assistance Recommended at Discharge    Functional Status Assessment Patient has had a recent decline in their functional status and demonstrates the ability to make significant improvements in function in a reasonable and predictable amount of time.  Frequency and Duration min 2x/week  2 weeks       Prognosis        Swallow Study   General HPI: 62 year old man who presented to [facility] ED 7/3 after presumed syncopal event. Pt with current esophageal CA (adenocarcinoma) with metastasis to lung and liver, on salvage therapy. Recently seen in Heme/Onc clinic 6/26 and declined chemotherapy that day due to weakness; rescheduled for week of admission.  Per report, patient was BIB EMS after a caregiver found patient with AMS after a presumed syncopal event. Patient was reportedly gray and had vomited; home was noted to be hot on EMS arrival. PMHx significant for HTN, CAD (medically managed, on Plavix), cardiac arrest (OOH VF 01/2022, on amiodarone), PAD with critical limb ischemia (s/p left AKA 2021), DVT/PE (on Eliquis), CVA, Type of Study: Bedside Swallow Evaluation Diet Prior to this  Study: NPO Temperature Spikes Noted: No Respiratory Status: Nasal cannula History of Recent Intubation: No Behavior/Cognition: Alert;Cooperative Oral Cavity Assessment: Within Functional Limits Oral Care Completed by SLP: No Oral Cavity - Dentition: Edentulous Self-Feeding Abilities: Needs assist Patient Positioning: Upright in bed Baseline Vocal Quality: Normal Volitional Cough: Strong Volitional Swallow: Able to elicit    Oral/Motor/Sensory Function Overall Oral Motor/Sensory Function: Within functional limits   Ice Chips     Thin Liquid Thin Liquid: Within functional limits Presentation: Straw    Nectar Thick Nectar Thick Liquid: Not tested   Honey Thick Honey Thick Liquid: Not tested   Puree Puree: Within functional limits Presentation: Spoon   Solid     Solid: Not tested      Claudine Mouton 12/29/2022,1:45 PM

## 2022-12-30 ENCOUNTER — Inpatient Hospital Stay (HOSPITAL_COMMUNITY): Payer: 59

## 2022-12-30 DIAGNOSIS — Z86718 Personal history of other venous thrombosis and embolism: Secondary | ICD-10-CM

## 2022-12-30 DIAGNOSIS — R1310 Dysphagia, unspecified: Secondary | ICD-10-CM | POA: Diagnosis not present

## 2022-12-30 DIAGNOSIS — I10 Essential (primary) hypertension: Secondary | ICD-10-CM

## 2022-12-30 DIAGNOSIS — Z7901 Long term (current) use of anticoagulants: Secondary | ICD-10-CM

## 2022-12-30 DIAGNOSIS — Z86711 Personal history of pulmonary embolism: Secondary | ICD-10-CM

## 2022-12-30 DIAGNOSIS — J9601 Acute respiratory failure with hypoxia: Secondary | ICD-10-CM | POA: Diagnosis not present

## 2022-12-30 DIAGNOSIS — I2510 Atherosclerotic heart disease of native coronary artery without angina pectoris: Secondary | ICD-10-CM

## 2022-12-30 DIAGNOSIS — C159 Malignant neoplasm of esophagus, unspecified: Secondary | ICD-10-CM

## 2022-12-30 DIAGNOSIS — A419 Sepsis, unspecified organism: Secondary | ICD-10-CM | POA: Diagnosis not present

## 2022-12-30 DIAGNOSIS — Z7902 Long term (current) use of antithrombotics/antiplatelets: Secondary | ICD-10-CM

## 2022-12-30 DIAGNOSIS — I739 Peripheral vascular disease, unspecified: Secondary | ICD-10-CM

## 2022-12-30 DIAGNOSIS — J690 Pneumonitis due to inhalation of food and vomit: Secondary | ICD-10-CM | POA: Diagnosis not present

## 2022-12-30 LAB — BASIC METABOLIC PANEL
Anion gap: 15 (ref 5–15)
BUN: 14 mg/dL (ref 8–23)
CO2: 20 mmol/L — ABNORMAL LOW (ref 22–32)
Calcium: 7.9 mg/dL — ABNORMAL LOW (ref 8.9–10.3)
Chloride: 102 mmol/L (ref 98–111)
Creatinine, Ser: 0.94 mg/dL (ref 0.61–1.24)
GFR, Estimated: >60 mL/min (ref >60)
Glucose, Bld: 77 mg/dL (ref 70–99)
Potassium: 3.3 mmol/L — ABNORMAL LOW (ref 3.5–5.1)
Sodium: 137 mmol/L (ref 135–145)

## 2022-12-30 LAB — GLUCOSE, CAPILLARY
Glucose-Capillary: 75 mg/dL (ref 70–99)
Glucose-Capillary: 88 mg/dL (ref 70–99)
Glucose-Capillary: 76 mg/dL (ref 70–99)
Glucose-Capillary: 94 mg/dL (ref 70–99)

## 2022-12-30 LAB — CBC
HCT: 30.7 % — ABNORMAL LOW (ref 39.0–52.0)
Hemoglobin: 9.5 g/dL — ABNORMAL LOW (ref 13.0–17.0)
MCH: 27.3 pg (ref 26.0–34.0)
MCHC: 30.9 g/dL (ref 30.0–36.0)
MCV: 88.2 fL (ref 80.0–100.0)
Platelets: 68 10*3/uL — ABNORMAL LOW (ref 150–400)
RBC: 3.48 MIL/uL — ABNORMAL LOW (ref 4.22–5.81)
RDW: 18 % — ABNORMAL HIGH (ref 11.5–15.5)
WBC: 5.6 10*3/uL (ref 4.0–10.5)
nRBC: 0 % (ref 0.0–0.2)

## 2022-12-30 LAB — CULTURE, BLOOD (ROUTINE X 2)
Culture: NO GROWTH
Culture: NO GROWTH
Special Requests: ADEQUATE

## 2022-12-30 LAB — BRAIN NATRIURETIC PEPTIDE: B Natriuretic Peptide: 488.6 pg/mL — ABNORMAL HIGH (ref 0.0–100.0)

## 2022-12-30 MED ORDER — FUROSEMIDE 10 MG/ML IJ SOLN
40.0000 mg | Freq: Once | INTRAMUSCULAR | Status: AC
  Administered 2022-12-30: 40 mg via INTRAVENOUS
  Filled 2022-12-30: qty 4

## 2022-12-30 MED ORDER — POTASSIUM CHLORIDE 20 MEQ PO PACK
60.0000 meq | PACK | Freq: Once | ORAL | Status: AC
  Administered 2022-12-30: 60 meq via ORAL
  Filled 2022-12-30: qty 3

## 2022-12-30 NOTE — Progress Notes (Signed)
Hospital day#3 Subjective:   Summary: [name] [name] is a 62 yo male with a PMHx significant for HTN, CAD on plavix, prior cardiac arrest in 2023 on amiodarone, PAD c/b by L AKA in 2021, prior PE on AC, esophageal CA with metastatic disease to the lung and liver on salvage therapy who presented to the ED after found unresponsive admitted to the ICU for septic shock in the setting of aspiration pneumonia likely secondary to dysphagia. Patient has now been off vasopressors for ~48 hours and is being transferred to IMTS for continued management   Overnight Events: No acute events   Patient overall doing better this AM, feels like his breathing has improved. He reported he has been having difficulty clearing his throat for about 1 month. Prior to admission, patient had difficulty breathing, a productive cough. I asked him about choking on food and patient denied it, but food does go down "the wrong pipe" as he often eats laying down or has coughing fits while eating. Patient usually needs a feeder at home and has not had any problems with his medications otherwise.   He is currently hungry and has been able to take sips of fluids in front of me. Denies generalized pain, nausea, vomiting, rigors, chest pain, headaches, vision changes. Discussed there are ongoing conversations about his cancer treatments and his possible HCPOA with palliative care. Patient would like to speak with his primary oncologist.  Objective:  Vital signs in last 24 hours: Vitals:   12/29/22 2000 12/29/22 2305 12/30/22 0310 12/30/22 0603  BP: 94/68 95/71 101/79   Pulse: 86 82 83   Resp: 20 19 20    Temp: 98.2 F (36.8 C) 97.8 F (36.6 C) 98.2 F (36.8 C)   TempSrc: Oral Oral Oral   SpO2: 95% 92% 95%   Weight:    63.9 kg   Supplemental O2: Nasal Cannula SpO2: 94 % O2 Flow Rate (L/min): 2 L/min Filed Weights   12/28/22 0449 12/29/22 0405 12/30/22 0603  Weight: 67 kg 66.2 kg 63.9 kg    Physical Exam:   Constitutional:Chronically ill-appearing man sitting in bed, in no acute distress HENT:  moist mucous membranes Neck: Difficulty visualizing JVP Cardiovascular: regular rate and rhythm, no m/r/g Pulmonary/Chest: normal work of breathing on room air, increased bibasilar crackes otherwise cleart to auscultation in the upper lung fields Abdominal: Normal BS, soft, non-tender, non-distended.  MSK: decreased bulk. L AKA. Contracted bilateral hands.  Neurological: alert & oriented x 3, 5/5 strength in bilateral upper and lower extremities, normal gait Skin: warm and dry Psych: Pleasant mood and affect    Intake/Output Summary (Last 24 hours) at 12/30/2022 0653 Last data filed at 12/29/2022 1900 Gross per 24 hour  Intake 593.4 ml  Output 3400 ml  Net -2806.6 ml   Net IO Since Admission: 697.95 mL [12/30/22 0653]  Pertinent Labs:    Latest Ref Rng & Units 12/28/2022    1:04 AM 12/27/2022    2:11 PM 12/27/2022    1:44 PM  CBC  WBC 4.0 - 10.5 K/uL 23.4   17.8   Hemoglobin 13.0 - 17.0 g/dL 04.5  40.9  81.1   Hematocrit 39.0 - 52.0 % 37.7  34.0  37.5   Platelets 150 - 400 K/uL 168   112        Latest Ref Rng & Units 12/30/2022    6:00 AM 12/29/2022   11:23 AM 12/28/2022    1:04 AM  CMP  Glucose 70 -  99 mg/dL 77  89  161   BUN 8 - 23 mg/dL 14  19  27    Creatinine 0.61 - 1.24 mg/dL 0.96  0.45  4.09   Sodium 135 - 145 mmol/L 137  134  136   Potassium 3.5 - 5.1 mmol/L 3.3  4.2  3.7   Chloride 98 - 111 mmol/L 102  109  110   CO2 22 - 32 mmol/L 20  17  16    Calcium 8.9 - 10.3 mg/dL 7.9  8.0  7.4   Total Protein 6.5 - 8.1 g/dL   5.8   Total Bilirubin 0.3 - 1.2 mg/dL   0.9   Alkaline Phos 38 - 126 U/L   202   AST 15 - 41 U/L   94   ALT 0 - 44 U/L   49     Imaging: No results found.  Assessment/Plan:   Active Problems:   Pneumonia due to infectious organism   AKI (acute kidney injury) (HCC)   Sepsis (HCC)   Metabolic acidosis  Acute hypoxemic respiratory failure Sepsis Aspiration  pneumonia 2/2 dysphagia Pulmonary edema Patient initially admitted after syncopal episode found to have septic shock with acute hypoxic respiratory faillure in setting of presumed aspiration pneumonia. Patient has received 3 days of antibiotic therapy and  s/p 1 dose of 40 mg IV lasix for concomitant pulmonary edema with good response in oxygenation and dyspnea. He is currently on 2L [place]. NGTD on blood cultures, resolution of leukocytosis, and no remains afebrile. BNP improved. Will give an additional dose of furosemide today and complete IV antibiotic course for a total of 5 days. -Aspiration precautions -Awaiting esophagogram -Continue CTX and doxycycline therapy (7/3-7/7) - !x IV furosemide 40 mg today -Monitor SPO2 and urine output  Metastatic esophageal carcinoma Salvage chemotherapy Dysphagia With disease process in lung and liver. Followed by Dr. Truett Perna. Last infusion on 11/2022, due for next round as off 7/1. SLP evaluation completed with dysphagia 1 diet; patient is to undergo esophagram for better evaluation of potential obstruction. Palliative care engaged in goals of care conversation with patient's care takers. Dr. Myrle Sheng is to meet with them next week to discuss disease course. Will continue full scope of care during this admission -Followed by palliative care; appreciate recommendations  -Full code -Referral to outpatient palliative care -Compazine 5 mg IV q6H PRN -Pain control: oxycodone 10 mg PO q6 PRN.  -May add Gabapentin and Cymbalta -Pureed diet   CAD, severe PVD History of VT cardiac arrest BNP elevated on admission and with mild troponin elevation thought to be in the setting of type 2 NSTEMI. TTE during this admission unchaged from prior.  - continue Plavix, amiodarone, atorvastatin 40 mg daily   HTN Stable off of pressors since transitioned off ICU. Currently s/p 2x doses of lasix. Will continue to hold home medications and monitoring  Hx DVT/PE - Continue  Eliquis   Hypokalemia -supplemented  Diet:  Dysphagia VTE: NOAC Code: Full PT/OT recs: pending Family Update: per palliative care   Dispo: Anticipated discharge to  pending  in 2-3 days pending continued medical improvement.   Morene Crocker, MD Internal Medicine Resident PGY-1 Please contact the on call pager after 5 pm and on weekends at [phone].

## 2022-12-30 NOTE — Progress Notes (Signed)
Palliative Medicine Inpatient Follow Up Note HPI: 62 year old man who presented to [facility] ED 7/3 after presumed syncopal event. 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 Recently seen in Heme/Onc clinic 6/26 and declined chemotherapy that day due to weakness.   PMT asked to get involved for further GOC conversations as well as symptom management.   Today's Discussion 12/30/2022  *Please note that this is a verbal dictation therefore any spelling or grammatical errors are due to the "Dragon Medical One" system interpretation.  Chart reviewed inclusive of vital signs, progress notes, laboratory results, and diagnostic images.   I met with [name] [name] this morning. He is more alert this morning and does remember meeting me yesterday. He was resting comfortably. We reviewed that his breathing is no longer labored and he is back down to 2LPM [place]. He himself feels comfortable and denies pain, nausea, or SOB. He shares that he "has got to eat" this morning. I was able to assist him in setting up his meal tray. He is aware of the need for an esophagram in the oncoming days. We did discuss the possibilities if there were a large mass there. We reviewed best case and worst case scenarios. I did introduce the idea of hospice should he be in a position whereby he has worsening spread of his cancer.   Created space and opportunity for patient to explore thoughts feelings and fears regarding current medical situation. He shares that he feels better and ideally would like to continue improving if possible.  We did discuss code status again though [place] resides that he would like to hear what Dr. Truett Perna has to say in regards to this. I shared that I would thus defer conversation to them.   I  called and spoke to patents roommate, [name]. She shares the plan to be present later in the day at which time we will review Flores' desires for his AD's together.   Questions and concerns addressed/Palliative Support Provided.   Objective Assessment: Vital Signs Vitals:   12/30/22 0917 12/30/22 0926  BP: 101/72   Pulse: 79 77  Resp: (!) 23 (!) 25  Temp: 98.2 F (36.8 C)   SpO2: 96% 95%    Intake/Output Summary (Last 24 hours) at 12/30/2022 1034 Last data filed at 12/30/2022 0900 Gross per 24 hour  Intake 443.4 ml  Output 4200 ml  Net -3756.6 ml   Last Weight  Most recent update: 12/30/2022  6:03 AM    Weight  63.9 kg (140 lb 14 oz)            Gen:  Elderly Caucasian M in NAD HEENT: moist mucous membranes CV: Regular rate and rhythm  PULM:  On 2LPM , breathing is even and nonlabored ABD: soft/nontender  EXT: LLE AKA Neuro: Alert and oriented x3  SUMMARY OF RECOMMENDATIONS   FULL CODE --> strongly recommended reviewing completion of the MOST form as well as consideration of DNAR/DNI given underlying disease burden   Appreciate Dr. Kalman Drape input regarding prognosis in the setting of metastatic esophageal cancer --> He plans to visit with patient on Monday   Appreciate Speech therapy involvement --> Esophagram on Monday  Advance Directives --> Have provided a copy to patient and will continue to educate on completion. Chaplain will return on Monday for notarization  Patient's goals are to continue current treatments and optimize his current health state   Will refer to outpatient palliative clinic upon discharge --> Have sent a message to Evon Slack and Rudi Coco for enrollment   Ongoing palliative care support  Billing based on MDM: High ______________________________________________________________________________________ Lamarr Lulas  Palliative Medicine Team Team Cell Phone: [phone] Please utilize secure chat with additional  questions, if there is no response within 30 minutes please call the above phone number  Palliative Medicine Team providers are available by phone from 7am to 7pm daily and can be reached through the team cell phone.  Should this patient require assistance outside of these hours, please call the patient's attending physician.

## 2022-12-31 DIAGNOSIS — R1310 Dysphagia, unspecified: Secondary | ICD-10-CM | POA: Diagnosis not present

## 2022-12-31 DIAGNOSIS — I2510 Atherosclerotic heart disease of native coronary artery without angina pectoris: Secondary | ICD-10-CM | POA: Diagnosis not present

## 2022-12-31 DIAGNOSIS — J690 Pneumonitis due to inhalation of food and vomit: Secondary | ICD-10-CM | POA: Diagnosis not present

## 2022-12-31 DIAGNOSIS — C159 Malignant neoplasm of esophagus, unspecified: Secondary | ICD-10-CM | POA: Diagnosis not present

## 2022-12-31 LAB — CBC
HCT: 32.5 % — ABNORMAL LOW (ref 39.0–52.0)
Hemoglobin: 10.2 g/dL — ABNORMAL LOW (ref 13.0–17.0)
MCH: 27.7 pg (ref 26.0–34.0)
MCHC: 31.4 g/dL (ref 30.0–36.0)
MCV: 88.3 fL (ref 80.0–100.0)
Platelets: 76 10*3/uL — ABNORMAL LOW (ref 150–400)
RBC: 3.68 MIL/uL — ABNORMAL LOW (ref 4.22–5.81)
RDW: 18.1 % — ABNORMAL HIGH (ref 11.5–15.5)
WBC: 5 10*3/uL (ref 4.0–10.5)
nRBC: 0 % (ref 0.0–0.2)

## 2022-12-31 LAB — GLUCOSE, CAPILLARY
Glucose-Capillary: 74 mg/dL (ref 70–99)
Glucose-Capillary: 69 mg/dL — ABNORMAL LOW (ref 70–99)
Glucose-Capillary: 85 mg/dL (ref 70–99)
Glucose-Capillary: 101 mg/dL — ABNORMAL HIGH (ref 70–99)
Glucose-Capillary: 107 mg/dL — ABNORMAL HIGH (ref 70–99)

## 2022-12-31 LAB — RENAL FUNCTION PANEL
Albumin: 2.1 g/dL — ABNORMAL LOW (ref 3.5–5.0)
Anion gap: 7 (ref 5–15)
BUN: 11 mg/dL (ref 8–23)
CO2: 21 mmol/L — ABNORMAL LOW (ref 22–32)
Calcium: 7.8 mg/dL — ABNORMAL LOW (ref 8.9–10.3)
Chloride: 107 mmol/L (ref 98–111)
Creatinine, Ser: 0.82 mg/dL (ref 0.61–1.24)
GFR, Estimated: >60 mL/min (ref >60)
Glucose, Bld: 73 mg/dL (ref 70–99)
Phosphorus: 2.5 mg/dL (ref 2.5–4.6)
Potassium: 3.3 mmol/L — ABNORMAL LOW (ref 3.5–5.1)
Sodium: 135 mmol/L (ref 135–145)

## 2022-12-31 LAB — CULTURE, BLOOD (ROUTINE X 2)

## 2022-12-31 LAB — BRAIN NATRIURETIC PEPTIDE: B Natriuretic Peptide: 311.2 pg/mL — ABNORMAL HIGH (ref 0.0–100.0)

## 2022-12-31 MED ORDER — POTASSIUM CHLORIDE CRYS ER 20 MEQ PO TBCR
60.0000 meq | EXTENDED_RELEASE_TABLET | Freq: Once | ORAL | Status: AC
  Administered 2022-12-31: 60 meq via ORAL
  Filled 2022-12-31: qty 3

## 2022-12-31 MED ORDER — OXYCODONE HCL 5 MG PO TABS
10.0000 mg | ORAL_TABLET | Freq: Four times a day (QID) | ORAL | Status: DC
  Administered 2022-12-31 – 2023-01-01 (×6): 10 mg via ORAL
  Filled 2022-12-31 (×6): qty 2

## 2022-12-31 NOTE — Plan of Care (Signed)

## 2022-12-31 NOTE — Progress Notes (Signed)
Palliative Medicine Inpatient Follow Up Note HPI: 62 year old man who presented to [facility] ED 7/3 after presumed syncopal event. 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 Recently seen in Heme/Onc clinic 6/26 and declined chemotherapy that day due to weakness.   PMT asked to get involved for further GOC conversations as well as symptom management.   Today's Discussion 12/31/2022  *Please note that this is a verbal dictation therefore any spelling or grammatical errors are due to the "Dragon Medical One" system interpretation.  Chart reviewed inclusive of vital signs, progress notes, laboratory results, and diagnostic images.   I met with [name] [name] this morning he shares that he is experiencing some nausea this morning. We discussed him getting an antiemetic which was provided by nursing. He also endorses some slight pain in his back though notes he has not asked for any medication as he was sleeping.   We discussed the plan for the esophagram tomorrow.  [name] [name] is anxious to speak to Dr. Truett Perna which I assured him would occur on Monday at some point.  We reviewed again code status and the recommendations of DNAR/DNI in the setting of [name] [name]' underlying disease burden.   I was able to help [name] [name] and his loved ones review his AD's for completion. They have been placed on his chart for tomorrow when the chaplain team will be able to assist in getting them notarized.   Questions and concerns addressed/Palliative Support Provided.   Objective Assessment: Vital Signs Vitals:   12/31/22 0643 12/31/22 1100  BP: 96/67 95/70  Pulse: 78 81  Resp: 20 14  Temp:  97.8 F (36.6 C)  SpO2: 96% 96%    Intake/Output Summary (Last 24 hours) at 12/31/2022 1228 Last data filed at  12/31/2022 1132 Gross per 24 hour  Intake 1395.03 ml  Output 3150 ml  Net -1754.97 ml    Last Weight  Most recent update: 12/31/2022  5:54 AM    Weight  60.9 kg (134 lb 4.2 oz)            Gen:  Elderly Caucasian M in NAD HEENT: moist mucous membranes CV: Regular rate and rhythm  PULM:  On RA, breathing is even and nonlabored ABD: soft/nontender  EXT: LLE AKA, weakness in BUE Neuro: Alert and oriented x3  SUMMARY OF RECOMMENDATIONS   FULL CODE --> strongly recommended reviewing completion of the MOST form as well as consideration of DNAR/DNI given underlying disease burden   Appreciate Dr. Kalman Drape input regarding prognosis in the setting of metastatic esophageal cancer --> He plans to visit with patient on Monday   Appreciate Speech therapy involvement --> Esophagram on Monday  Advance Directives --> These are completed and on the front of patients chart  Patient's goals are to continue current treatments and optimize his current health state   Will refer to outpatient palliative clinic upon discharge --> Have sent a message to [name] [name] and [name] [name] for enrollment   Ongoing palliative care support  Time: 21 Billing based on MDM: High ______________________________________________________________________________________ [name] [name] Andrew Palliative Medicine Team Team Cell Phone: [phone] Please utilize secure chat with additional questions, if there is no response within 30 minutes please call the above phone number  Palliative Medicine Team providers are available by phone from 7am to 7pm daily and  can be reached through the team cell phone.  Should this patient require assistance outside of these hours, please call the patient's attending physician.

## 2022-12-31 NOTE — Evaluation (Addendum)
Physical Therapy Evaluation Patient Details Name: [name] [name] MRN: [id number] DOB: 05-18-1961 Today's Date: 12/31/2022  History of Present Illness  The pt is a 62 yo male presenting 7/3 after a syncopal event and vomiting. Pt hypotensive, hypoglycemic (BG of 36), and hypoxic to 86% on RA with EMS. PMH includes: esophageal cancer with mets to lung and liver on chemo, HTN, CAD, cardiac arrest 2023, PAD, DVT/PE.   Clinical Impression  Pt in bed upon arrival of PT, agreeable to evaluation at this time. Prior to admission the pt was completing transfers without assistance, using WC in the house, and reports he was able to complete some of ADLs with assist from aide or friends as needed to complete. The pt was limited to sitting EOB this session due to reports of pain in his back, fatigue, and dizziness (SBP in low 100s but stable), and required modA to complete bed mobility at this time. He demos poor strength in RLE and poor ROM and strength in BUE this morning, will benefit from continued skilled PT to progress transfer ability to allow pt to achieve goal of return home. At this time, he needs more assist than friends could safely provide, and may benefit from short stint of continued in-patient therapies to improve ability to transfer prior to return home.     Assistance Recommended at Discharge Frequent or constant Supervision/Assistance  If plan is discharge home, recommend the following:  Can travel by private vehicle  Two people to help with walking and/or transfers;Two people to help with bathing/dressing/bathroom;Assistance with feeding;Direct supervision/assist for medications management;Direct supervision/assist for financial management;Assist for transportation;Help with stairs or ramp for entrance   No    Equipment Recommendations Other (comment) (hoyer lift)  Recommendations for Other Services       Functional Status Assessment Patient has had a recent decline in their functional  status and demonstrates the ability to make significant improvements in function in a reasonable and predictable amount of time.     Precautions / Restrictions Precautions Precautions: Fall Precaution Comments: watch BP, chronic L AKA Restrictions Weight Bearing Restrictions: No      Mobility  Bed Mobility Overal bed mobility: Needs Assistance Bed Mobility: Sit to Supine, Supine to Sit     Supine to sit: Mod assist Sit to supine: Mod assist   General bed mobility comments: modA to complete elevation of trunk and movement of LE to EOB. modA to reposition in bed    Transfers Overall transfer level: Needs assistance Equipment used: None Transfers: Bed to chair/wheelchair/BSC            Lateral/Scoot Transfers: Total assist General transfer comment: pt declined OOB transfer due to back soreness and dizziness with sitting EOB, dependent on assist from therapist to complete lateral scoot along EOB with pt stating he was unable to assist with UE due to shoulder pain      Balance Overall balance assessment: Needs assistance Sitting-balance support: Bilateral upper extremity supported, Feet supported Sitting balance-Leahy Scale: Poor Sitting balance - Comments: unable to maintain balance without BUE support, cannot balance while moving RLE Postural control: Posterior lean                                   Pertinent Vitals/Pain Pain Assessment Pain Assessment: Faces Faces Pain Scale: Hurts even more Pain Location: back Pain Descriptors / Indicators: Discomfort, Grimacing Pain Intervention(s): Limited activity within patient's tolerance, Monitored during session,  RN gave pain meds during session, Repositioned    Home Living Family/patient expects to be discharged to:: Private residence Living Arrangements: Non-relatives/Friends (2 adult room mates) Available Help at Discharge: Friend(s);Personal care attendant;Available 24 hours/day Type of Home:  House Home Access: Ramped entrance       Home Layout: One level Home Equipment: Tub bench;Grab bars - tub/shower;Rolling Walker (2 wheels);Wheelchair - manual;BSC/3in1;Hand held shower head Additional Comments: Roommate's car lacks windsheild wipers, can't drive in the rain    Prior Function Prior Level of Function : Needs assist             Mobility Comments: since prior hospitalizationpt requiring assist for transfers to and from wheelchair and car. awaiting new prosthetic, uses WC in the house and can propel himself ADLs Comments: friends assist as needed, aide assists with shower every other day. pt states he "does what he can" and then asks for help. friends do IADLs     Hand Dominance   Dominant Hand: Left    Extremity/Trunk Assessment   Upper Extremity Assessment Upper Extremity Assessment: Generalized weakness (pt states he is unable to reach face with wash cloth due to pain in bilateral shoulders, did not want PT to range. RA in hands)    Lower Extremity Assessment Lower Extremity Assessment: RLE deficits/detail;LLE deficits/detail RLE Deficits / Details: edema below knee, but with poor functional movement against gravity, able to maintain against light pressure. RLE Sensation: decreased light touch LLE Deficits / Details: chronic AKA, grossly 3-/5 to MMT in all directions at hip    Cervical / Trunk Assessment Cervical / Trunk Assessment: Normal  Communication   Communication: No difficulties  Cognition Arousal/Alertness: Awake/alert Behavior During Therapy: WFL for tasks assessed/performed Overall Cognitive Status: No family/caregiver present to determine baseline cognitive functioning                                 General Comments: no family present, pt able to answer questions regarding home set up, oriented to month, year, but not day of week or date.        General Comments General comments (skin integrity, edema, etc.): BP soft but  stable with transition to sitting. pt reports continued dizziness but no change in BP    Exercises     Assessment/Plan    PT Assessment Patient needs continued PT services  PT Problem List Decreased activity tolerance;Decreased balance;Decreased mobility       PT Treatment Interventions DME instruction;Gait training;Stair training;Functional mobility training;Therapeutic activities;Therapeutic exercise;Balance training;Patient/family education    PT Goals (Current goals can be found in the Care Plan section)  Acute Rehab PT Goals Patient Stated Goal: return home, get new prosthetic PT Goal Formulation: With patient Time For Goal Achievement: 01/14/23 Potential to Achieve Goals: Good    Frequency Min 2X/week        AM-PAC PT "6 Clicks" Mobility  Outcome Measure Help needed turning from your back to your side while in a flat bed without using bedrails?: A Lot Help needed moving from lying on your back to sitting on the side of a flat bed without using bedrails?: A Lot Help needed moving to and from a bed to a chair (including a wheelchair)?: Total Help needed standing up from a chair using your arms (e.g., wheelchair or bedside chair)?: Total Help needed to walk in hospital room?: Total Help needed climbing 3-5 steps with a railing? : Total 6 Click Score:  8    End of Session   Activity Tolerance: Patient limited by fatigue Patient left: in bed;with call bell/phone within reach;with bed alarm set Nurse Communication: Mobility status PT Visit Diagnosis: Other abnormalities of gait and mobility (R26.89);Muscle weakness (generalized) (M62.81);Pain Pain - part of body:  (back)    Time: 1610-9604 PT Time Calculation (min) (ACUTE ONLY): 18 min   Charges:   PT Evaluation $PT Eval Low Complexity: 1 Low   PT General Charges $$ ACUTE PT VISIT: 1 Visit         Vickki Muff, PT, DPT   Acute Rehabilitation Department Office [phone] Secure Chat Communication  Preferred  Ronnie Derby 12/31/2022, 11:40 AM

## 2022-12-31 NOTE — Progress Notes (Signed)
Hospital day#4 Subjective:   Summary: [name] [name] is a 62 yo male with a PMHx significant for HTN, CAD on plavix, prior cardiac arrest in 2023 on amiodarone, PAD c/b by L AKA in 2021, prior PE on AC, esophageal CA with metastatic disease to the lung and liver on salvage therapy who presented to the ED after found unresponsive admitted to the ICU for septic shock in the setting of aspiration pneumonia likely secondary to dysphagia, now completing 5-day course of antibiotic treatment and continuing to improve  Overnight Events: No acute events  Patient reports he is feeling well overall outside of back pain, which he regularly has. He reports improved SOB. He denies abdominal pain, productive cough. He has not yet seen PT. Encouraged pt to move more and work with PT. he is amenable  Objective:  Vital signs in last 24 hours: Vitals:   12/30/22 2315 12/31/22 0547 12/31/22 0643 12/31/22 1100  BP: 120/83 93/75 96/67  95/70  Pulse: 81 79 78 81  Resp: 18 16 20 14   Temp: 98.3 F (36.8 C) 97.8 F (36.6 C)  97.8 F (36.6 C)  TempSrc: Oral Oral  Oral  SpO2: 94% 95% 96% 96%  Weight:  60.9 kg     Supplemental O2: Nasal Cannula SpO2: 96 % O2 Flow Rate (L/min): 2 L/min Filed Weights   12/29/22 0405 12/30/22 0603 12/31/22 0547  Weight: 66.2 kg 63.9 kg 60.9 kg    Physical Exam:  Chronically ill-appearing man sitting up in bed in no apparent distress Moist mucous membranes Regular rate and rhythm without murmurs on auscultation Normal work of breathing on room air with improvement in bibasilar crackles without wheezing on auscultation.  Clear to auscultation on mid to upper lung fields Abdominal: Normal BS, soft, non-tender, non-distended.  MSK: decreased bulk. L AKA. Contracted bilateral hands.  Right upper chest port site without induration or erythema on exam Neurological: Conversing appropriately; aware of reasons for being in hospital Skin is warm and dry Pleasant mood and  affect    Intake/Output Summary (Last 24 hours) at 12/31/2022 1408 Last data filed at 12/31/2022 1132 Gross per 24 hour  Intake 602.42 ml  Output 2200 ml  Net -1597.58 ml   Net IO Since Admission: -1,857.02 mL [12/31/22 1408]  Pertinent Labs:    Latest Ref Rng & Units 12/31/2022    5:56 AM 12/30/2022    6:36 AM 12/28/2022    1:04 AM  CBC  WBC 4.0 - 10.5 K/uL 5.0  5.6  23.4   Hemoglobin 13.0 - 17.0 g/dL 16.1  9.5  09.6   Hematocrit 39.0 - 52.0 % 32.5  30.7  37.7   Platelets 150 - 400 K/uL 76  68  168        Latest Ref Rng & Units 12/31/2022    5:56 AM 12/30/2022    6:00 AM 12/29/2022   11:23 AM  CMP  Glucose 70 - 99 mg/dL 73  77  89   BUN 8 - 23 mg/dL 11  14  19    Creatinine 0.61 - 1.24 mg/dL 0.45  4.09  8.11   Sodium 135 - 145 mmol/L 135  137  134   Potassium 3.5 - 5.1 mmol/L 3.3  3.3  4.2   Chloride 98 - 111 mmol/L 107  102  109   CO2 22 - 32 mmol/L 21  20  17    Calcium 8.9 - 10.3 mg/dL 7.8  7.9  8.0     Imaging:  No results found.  Assessment/Plan:   Active Problems:   Pneumonia due to infectious organism   AKI (acute kidney injury) (HCC)   Sepsis (HCC)   Metabolic acidosis  Acute hypoxemic respiratory failure, resolved Sepsis, resolved Aspiration pneumonia 2/2 dysphagia, improving Pulmonary edema, improving Patient with marked clinical improvement, now status post IV Lasix 40 mg x 2 with appropriate diuresis and completing 5-day course of IV antibiotics.  Euvolemic on exam today.  Will hold off on IV diuresis today and continue to monitor throughout the day for continued improvement in oxygenation on room air.  Suspect patient will be clinically stable for discharge in 1 to 2 days -Aspiration precautions -Awaiting esophagogram -Continue CTX and doxycycline therapy (7/3-7/7) - 2x IV furosemide 40 mg today -Monitor SPO2 and urine output  Metastatic esophageal carcinoma Salvage chemotherapy Dysphagia With disease process in lung and liver. Followed by Dr. Truett Perna.  Last infusion on 11/2022, due for next round as off 7/1. SLP evaluation completed with dysphagia 1 diet; patient is to undergo esophagram for better evaluation of potential obstruction. Palliative care engaged in goals of care conversation with patient's care takers. Dr. Myrle Sheng is to meet with them next week to discuss disease course. Will continue full scope of care during this admission -Followed by palliative care; appreciate recommendations  -Full code -Referral to outpatient palliative care -Compazine 5 mg IV q6H PRN -Pain control: Will schedule oxycodone 10 mg PO q6 -May add Gabapentin and Cymbalta on 7/8 -Dysphagia 1 diet  CAD, severe PVD History of VT cardiac arrest BNP elevated on admission and with mild troponin elevation thought to be in the setting of type 2 NSTEMI. TTE during this admission unchaged from prior.  - continue Plavix, amiodarone, atorvastatin 40 mg daily   HTN Stable off of pressors since transitioned off ICU. Currently s/p 2x doses of lasix. Will continue to hold home medications and monitoring for signs of hypertension  Hx DVT/PE - Continue Eliquis   Hypokalemia -Supplemented  Diet:  Dysphagia VTE: NOAC Code: Full PT/OT recs: pending Family Update: per palliative care  Dispo: Anticipated discharge to  pending  in 2-3 days pending continued medical improvement.   Morene Crocker, MD Internal Medicine Resident PGY-2 Please contact the on call pager after 5 pm and on weekends at [phone].

## 2023-01-01 ENCOUNTER — Telehealth: Payer: Self-pay | Admitting: *Deleted

## 2023-01-01 ENCOUNTER — Inpatient Hospital Stay (HOSPITAL_COMMUNITY): Payer: 59

## 2023-01-01 DIAGNOSIS — I959 Hypotension, unspecified: Secondary | ICD-10-CM | POA: Diagnosis not present

## 2023-01-01 DIAGNOSIS — E8721 Acute metabolic acidosis: Secondary | ICD-10-CM

## 2023-01-01 DIAGNOSIS — C801 Malignant (primary) neoplasm, unspecified: Secondary | ICD-10-CM

## 2023-01-01 DIAGNOSIS — C787 Secondary malignant neoplasm of liver and intrahepatic bile duct: Secondary | ICD-10-CM

## 2023-01-01 DIAGNOSIS — J9690 Respiratory failure, unspecified, unspecified whether with hypoxia or hypercapnia: Secondary | ICD-10-CM | POA: Diagnosis not present

## 2023-01-01 DIAGNOSIS — J690 Pneumonitis due to inhalation of food and vomit: Secondary | ICD-10-CM

## 2023-01-01 LAB — CULTURE, BLOOD (ROUTINE X 2): Culture: NO GROWTH

## 2023-01-01 LAB — GLUCOSE, CAPILLARY
Glucose-Capillary: 75 mg/dL (ref 70–99)
Glucose-Capillary: 98 mg/dL (ref 70–99)

## 2023-01-01 LAB — CBC
HCT: 33.4 % — ABNORMAL LOW (ref 39.0–52.0)
Hemoglobin: 10.5 g/dL — ABNORMAL LOW (ref 13.0–17.0)
MCH: 27.9 pg (ref 26.0–34.0)
MCHC: 31.4 g/dL (ref 30.0–36.0)
MCV: 88.8 fL (ref 80.0–100.0)
Platelets: 82 10*3/uL — ABNORMAL LOW (ref 150–400)
RBC: 3.76 MIL/uL — ABNORMAL LOW (ref 4.22–5.81)
RDW: 18.2 % — ABNORMAL HIGH (ref 11.5–15.5)
WBC: 5.2 10*3/uL (ref 4.0–10.5)
nRBC: 0 % (ref 0.0–0.2)

## 2023-01-01 LAB — RENAL FUNCTION PANEL
Albumin: 2 g/dL — ABNORMAL LOW (ref 3.5–5.0)
Anion gap: 8 (ref 5–15)
BUN: 12 mg/dL (ref 8–23)
CO2: 21 mmol/L — ABNORMAL LOW (ref 22–32)
Calcium: 8 mg/dL — ABNORMAL LOW (ref 8.9–10.3)
Chloride: 108 mmol/L (ref 98–111)
Creatinine, Ser: 0.82 mg/dL (ref 0.61–1.24)
GFR, Estimated: >60 mL/min (ref >60)
Glucose, Bld: 84 mg/dL (ref 70–99)
Phosphorus: 2.7 mg/dL (ref 2.5–4.6)
Potassium: 3.7 mmol/L (ref 3.5–5.1)
Sodium: 137 mmol/L (ref 135–145)

## 2023-01-01 LAB — MAGNESIUM: Magnesium: 1.7 mg/dL (ref 1.7–2.4)

## 2023-01-01 LAB — BRAIN NATRIURETIC PEPTIDE: B Natriuretic Peptide: 291.8 pg/mL — ABNORMAL HIGH (ref 0.0–100.0)

## 2023-01-01 MED ORDER — HEPARIN SOD (PORK) LOCK FLUSH 100 UNIT/ML IV SOLN
500.0000 [IU] | INTRAVENOUS | Status: AC | PRN
  Administered 2023-01-01: 500 [IU]

## 2023-01-01 MED ORDER — POTASSIUM CHLORIDE CRYS ER 20 MEQ PO TBCR
20.0000 meq | EXTENDED_RELEASE_TABLET | Freq: Once | ORAL | Status: AC
  Administered 2023-01-01: 20 meq via ORAL
  Filled 2023-01-01: qty 1

## 2023-01-01 MED ORDER — MAGNESIUM SULFATE 2 GM/50ML IV SOLN
2.0000 g | Freq: Once | INTRAVENOUS | Status: AC
  Administered 2023-01-01: 2 g via INTRAVENOUS
  Filled 2023-01-01: qty 50

## 2023-01-01 NOTE — Progress Notes (Signed)
Subjective:  [name] [name] is a 62 yo male with a PMHx significant for HTN, CAD on plavix, prior cardiac arrest in 2023 on amiodarone, PAD c/b by L AKA in 2021, prior PE on AC, esophageal CA with metastatic disease to the lung and liver on salvage therapy who is admitted to the ICU for sepsis due to aspiration pneumonia.  He completed his antibiotic regimen.  Today, he reported adequate pain relief with the scheduled regimen.  He reports choking/coughing while eating.  He denied coughing except for the cough and he experiences with eating.  We had a discussion on what he would like to do after speaking with the oncologist.  He reported that he would like to continue the chemotherapy and that he would like to remain full code.  He was able to explain what that meant.  The patient also would like to go home and not go to a SNF.  He reports having adequate support at home with his roommates.  I stopped back to see the patient, gave me permission to call his roommates to discuss the level of support at home.  I went through the risk and benefits of going home versus going to a skilled nursing facility.  He is aware of the risks associated with going home.   Objective:  Vital signs in last 24 hours: Vitals:   12/31/22 2320 01/01/23 0404 01/01/23 0434 01/01/23 0740  BP: 134/83 114/89  (!) 126/91  Pulse: 79 79  83  Resp: 15 17  (!) 21  Temp: 97.8 F (36.6 C) 97.9 F (36.6 C)  98 F (36.7 C)  TempSrc: Oral Oral  Oral  SpO2: 93% 97%  97%  Weight:   62.1 kg    Physical exam: General: Patient is sitting in bed comfortably, falling asleep during exam Cardiac: RRR, no pitting edema appreciated Pulmonary: Normal work of breathing, clear breath sounds bilaterally Skin: warm and dry N[place]uo: Alert and oriented x 3  Assessment/Plan:  Active Problems:   Pneumonia due to infectious organism   AKI (acute kidney injury) (HCC)   Sepsis (HCC)   Metabolic acidosis  #Sepsis-Resolved #Aspiration  pneumonia  The aspiration pneumonia was believed to be caused from dysphagia.[name]  He is oxygenating well: Oxygen saturations have been above 90% on room air.  He is antibiotic course on 7/7. -Aspiration guidelines  -Esophagram showed: Study was limited due to patient's limited mobility, within limitation, no aspiration or penetration seen with thin barium, decreased esophageal mobility -I contacted SLP who did not recommend a modified barium swallow.  However they do recommend setting him up with a SLP when he is discharged.  Will contact the appropriate contact to set up the SLP outpatient.  #Metastatic esophageal cancer I spoke with palliative care on the phone, they do not plan on having a goals of care conversation today since the had a goals of care discussion the other day.  Patient is aware of the difference between full CODE STATUS and the understanding of DNR/DNI.  He wishes to remain full CODE STATUS and to continue chemotherapy for the esophageal cancer. -Will have him continue to follow-up with oncologist outpatient -He prefers to be discharged home with home health therapy, he does not want to be at [facility].  Will contact roommates to ensure he will receive the appropriate help at home.  #H/o HTN -Within the past 24 hours, highest blood pressure has been 142/91.  Will continue to monitor, however with the previous readings ranging from  normal limits to low 90s/60s, will continue to hold  #H/O CAD, VT, PVD -Continue Plavix, amiodarone, atorvastatin 40mg    # History of DVT, PE -Continue eliquis     Prior to Admission Living Arrangement:home  Anticipated Discharge Location:home/ SMF  Dispo: Anticipated discharge in approximately 1 day.  Faith Rogue, DO 01/01/2023, 10:47 AM Pager: [phone] After 5pm on weekdays and 1pm on weekends: On Call pager [phone]

## 2023-01-01 NOTE — Care Management Important Message (Signed)
Important Message  Patient Details  Name: [name] [name] MRN: [id number] Date of Birth: 11/02/1960   Medicare Important Message Given:  Yes     Renie Ora 01/01/2023, 8:29 AM

## 2023-01-01 NOTE — Evaluation (Signed)
Occupational Therapy Evaluation Patient Details Name: [name] [name] MRN: [id number] DOB: Mar 20, 1961 Today's Date: 01/01/2023   History of Present Illness The pt is a 62 yo male presenting 7/3 after a syncopal event and vomiting. Pt hypotensive, hypoglycemic (BG of 36), and hypoxic to 86% on RA with EMS. PMH includes: esophageal cancer with mets to lung and liver on chemo, HTN, CAD, cardiac arrest 2023, PAD, DVT/PE.   Clinical Impression   Patient reports that prior to this hospital admission, he requires A for ADLs from his friends that live with him. Initially, patient reports that he was Independent in ADLs but with further discussion, patient reports that he relies heavily on his friend to help him with UB/LB ADLs. He uses w/c for functional mobility and reports that he was able to complete functional txfrs Independently.  Patient currently requires mod A for supine<>sit, lateral scoot attempted but patient unable to complete and requires total A for donning sock while seated EOB.  Patient completed sit to stand from EOB with mod A but unable to stand for only 20 seconds before having to sit back down on EOB. Patient has limited ROM in LUE along with decreased FMC  and gross grip strength in B/L hands.  Patient would benefit from additional OT intervention to address functional deficits listed above and to increase independence in functional mobility and ADLs.     Recommendations for follow up therapy are one component of a multi-disciplinary discharge planning process, led by the attending physician.  Recommendations may be updated based on patient status, additional functional criteria and insurance authorization.   Assistance Recommended at Discharge    Patient can return home with the following      Functional Status Assessment     Equipment Recommendations       Recommendations for Other Services       Precautions / Restrictions Precautions Precautions: Fall Precaution Comments:  watch BP, chronic L AKA Restrictions Weight Bearing Restrictions: No      Mobility Bed Mobility Overal bed mobility: Needs Assistance Bed Mobility: Sit to Supine, Supine to Sit     Supine to sit: Mod assist Sit to supine: Mod assist   General bed mobility comments: modA to complete elevation of trunk and movement of LE to EOB. modA to reposition in bed    Transfers Overall transfer level: Needs assistance Equipment used: Rolling walker (2 wheels) Transfers: Bed to chair/wheelchair/BSC            Lateral/Scoot Transfers: Total assist        Balance Overall balance assessment: Needs assistance Sitting-balance support: Bilateral upper extremity supported, Feet supported                                       ADL either performed or assessed with clinical judgement   ADL Overall ADL's : Needs assistance/impaired Eating/Feeding: Set up   Grooming: Wash/dry face;Wash/dry hands;Set up   Upper Body Bathing: Moderate assistance   Lower Body Bathing: Maximal assistance   Upper Body Dressing : Moderate assistance   Lower Body Dressing: Maximal assistance               Functional mobility during ADLs: Moderate assistance       Vision Baseline Vision/History: 0 No visual deficits Patient Visual Report: No change from baseline       Perception     Praxis  Pertinent Vitals/Pain Pain Assessment Pain Assessment: 0-10 Pain Score: 6  Pain Location: back Pain Descriptors / Indicators: Discomfort, Grimacing     Hand Dominance Left   Extremity/Trunk Assessment Upper Extremity Assessment Upper Extremity Assessment: Generalized weakness (very limited shoulder flexion/abduction of L UE  along with decreased gross grasp and FMC in b/l hands)       Cervical / Trunk Assessment Cervical / Trunk Assessment: Normal   Communication Communication Communication: No difficulties   Cognition Arousal/Alertness: Awake/alert Behavior During  Therapy: WFL for tasks assessed/performed Overall Cognitive Status: No family/caregiver present to determine baseline cognitive functioning                                 General Comments: no family present, pt able to answer questions regarding home set up, oriented to month, year, but not day of week or date.     General Comments       Exercises     Shoulder Instructions      Home Living Family/patient expects to be discharged to:: Private residence Living Arrangements: Non-relatives/Friends Available Help at Discharge: Friend(s);Personal care attendant;Available 24 hours/day Type of Home: House Home Access: Ramped entrance     Home Layout: One level     Bathroom Shower/Tub: Chief Strategy Officer: Standard Bathroom Accessibility: Yes How Accessible: Accessible via wheelchair Home Equipment: Tub bench;Grab bars - tub/shower;Rolling Walker (2 wheels);Wheelchair - manual;BSC/3in1;Hand held shower head   Additional Comments: Roommate's car lacks windsheild wipers, can't drive in the rain      Prior Functioning/Environment Prior Level of Function : Needs assist  Cognitive Assist : ADLs (cognitive)   ADLs (Cognitive): Intermittent cues Physical Assist : ADLs (physical)   ADLs (physical): Bathing;Dressing;IADLs;Toileting Mobility Comments: since prior hospitalizationpt requiring assist for transfers to and from wheelchair and car. awaiting new prosthetic, uses WC in the house and can propel himself ADLs Comments: friends assist as needed, aide assists with shower every other day. pt states he "does what he can" and then asks for help. friends do IADLs        OT Problem List:        OT Treatment/Interventions:      OT Goals(Current goals can be found in the care plan section)    OT Frequency:      Co-evaluation              AM-PAC OT "6 Clicks" Daily Activity     Outcome Measure                 End of Session    Activity  Tolerance:   Patient left:                     Time:  -    Charges:     Governor Specking OT/L  Denice Paradise 01/01/2023, 4:15 PM

## 2023-01-01 NOTE — TOC Initial Note (Signed)
Transition of Care (TOC) - Initial/Assessment Note  Donn Pierini RN, BSN Transitions of Care Unit 4E- RN Case Manager See Treatment Team for direct phone #   Patient Details  Name: [name] [name] MRN: [id number] Date of Birth: [birth year]  Transition of [facility]) CM/SW Contact:    Darrold Span, RN Phone Number: 01/01/2023, 3:48 PM  Clinical Narrative:                 Cm spoke with pt at bedside to discuss transition plans/needs.  Per pt he lives at home with assistance from close friends Carey/[name] Flippin. Pt reports that these friends assist him 3-4 hrs per day put are available to assist more if needed. Asked pt if he had any other family- pt states no. Pt does report that he has an aide that comes and is to have someone else to come as well to assist several times a week- but not sure of agencies names. Asked if I might contact the [facility] to see if they could tell me anything further on the [facility] arrangements- pt voiced he would prefer this CM not contact them at this time- he would rather them make the contact first. Discussed with pt going home vs SNF short term- pt voiced he does not want SNF and prefers to return home w/ HH. Pt voiced that [name] and [name] can help provide the needed assistance.  Discussed DME- pt voiced he has hospital bed, wheelchair and all DME that he needs at this time. Reports that his friends can assist in transporting him home.   On review in Bamboo/Patient Ping- noted pt active with [facility]- call made to liaison to confirm- per [place] pt is active with [facility]/PT/OT/aide- and they can resume services with resumption orders.  Orders have been placed for RN/PT/OT/aide needs. Also per chart review noted pt is to have a care aide 3x/week for ambulation needs- but not sure who that has been set up with. Pt can follow up with PCP.   Attending team updated, TOC will continue to follow for any further needs.   Expected Discharge Plan: Home w Home Health  Services Barriers to Discharge: Continued Medical Work up   Patient Goals and CMS Choice Patient states their goals for this hospitalization and ongoing recovery are:: return home with friend support CMS Medicare.gov Compare Post Acute Care list provided to:: Patient Choice offered to / list presented to : Patient      Expected Discharge Plan and Services   Discharge Planning Services: CM Consult Post Acute Care Choice: Home Health, Resumption of Svcs/PTA Provider Living arrangements for the past 2 months: Single Family Home                   DME Agency: NA       HH Arranged: RN, PT, OT, Nurse's Aide HH Agency: [facility] Date HH Agency Contacted: 01/01/23 Time HH Agency Contacted: 1130 Representative spoke with at [facility] Agency: [place]  Prior Living Arrangements/Services Living arrangements for the past 2 months: Single Family Home Lives with:: Friends, Roommate Patient language and need for interpreter reviewed:: Yes Do you feel safe going back to the place where you live?: Yes      Need for Family Participation in Patient Care: Yes (Comment) Care giver support system in place?: Yes (comment) Current home services:  (wheelchair and prosthesis, hospital bed,) Criminal Activity/Legal Involvement Pertinent to Current Situation/Hospitalization: No - Comment as needed  Activities of Daily Living  Permission Sought/Granted Permission sought to share information with : Facility Industrial/product designer granted to share information with : Yes, Verbal Permission Granted     Permission granted to share info w AGENCY: HH        Emotional Assessment Appearance:: Appears stated age Attitude/Demeanor/Rapport: Engaged Affect (typically observed): Accepting, Appropriate Orientation: : Oriented to Self, Oriented to Place, Oriented to  Time, Oriented to Situation Alcohol / Substance Use: Not Applicable Psych Involvement: No (comment)  Admission diagnosis:   Metabolic acidosis [E87.20] Hypoglycemia [E16.2] AKI (acute kidney injury) (HCC) [N17.9] Sepsis (HCC) [A41.9] Pneumonia due to infectious organism, unspecified laterality, unspecified part of lung [J18.9] Sepsis with acute hypoxic respiratory failure and septic shock, due to unspecified organism (HCC) [A41.9, R65.21, J96.01] Patient Active Problem List   Diagnosis Date Noted   Metabolic acidosis 12/29/2022   Sepsis (HCC) 12/27/2022   Rash 10/30/2022   Hypotension 10/30/2022   Opioid use 08/16/2022   Protein-calorie malnutrition, severe 08/08/2022   Ventricular tachycardia (HCC) 08/04/2022   Dyspnea 06/15/2022   Ventricular bigeminy 04/30/2022   Multiple closed facial bone fractures, initial encounter (HCC) 04/30/2022   CAD s/p CABG 2020, STEMI 01/2022 04/30/2022   Demand ischemia of myocardium    NSTEMI (non-ST elevated myocardial infarction) (HCC) 04/09/2022   Esophageal cancer, stage IV (HCC) 03/22/2022   Acute metabolic encephalopathy 03/11/2022   Stroke (HCC) 02/28/2022   Adenocarcinoma of unknown origin (HCC) 02/22/2022   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), left (HCC) 01/31/2022   Cardiac arrest (HCC) 01/31/2022   Anoxic brain injury (HCC) 01/31/2022   Thrombocytopenia (HCC) 01/31/2022   Pulmonary emboli (HCC) 01/31/2022   Aspiration pneumonia (HCC) 01/31/2022   PAD (peripheral artery disease) (HCC)    Hypokalemia 05/30/2018   S/P lumbar fusion, Lower back pain 01/03/2018   Coronary artery disease    AKI (acute kidney injury) (HCC) 12/09/2015   Tobacco use disorder 08/20/2012   Normocytic anemia 08/06/2012   Pneumonia due to infectious organism 11/07/2011   HTN (hypertension) 04/20/2011   Chronic pain syndrome 02/22/2010   HLD (hyperlipidemia) 10/08/2009   Rheumatoid arthritis (HCC) 08/11/2009   PCP:  Rocky Morel, DO Pharmacy:   [facility] - [place], [place] - [address] [place] [address] Phone:  [phone] Fax: [phone]     Social Determinants of Health (SDOH) Social History: SDOH Screenings   Food Insecurity: No Food Insecurity (11/10/2022)  Housing: Low Risk  (11/10/2022)  Transportation Needs: Unmet Transportation Needs (11/10/2022)  Utilities: Not At Risk (11/10/2022)  Alcohol Screen: Low Risk  (11/10/2022)  Depression (PHQ2-9): Medium Risk (11/10/2022)  Financial Resource Strain: Low Risk  (11/10/2022)  Physical Activity: Insufficiently Active (11/10/2022)  Social Connections: Moderately Isolated (11/10/2022)  Stress: No Stress Concern Present (11/10/2022)  Tobacco Use: Medium Risk (12/27/2022)   SDOH Interventions:     Readmission Risk Interventions    08/09/2022   11:59 AM 05/02/2022    4:00 PM 03/15/2022    1:24 PM  Readmission Risk Prevention Plan  Transportation Screening Complete Complete Complete  Medication Review Oceanographer) Complete Complete Complete  HRI or Home Care Consult Complete Complete Complete  SW Recovery Care/Counseling Consult Complete Complete Complete  Palliative Care Screening Not Applicable Not Applicable Not Applicable  Skilled Nursing Facility Not Applicable Complete Not Applicable

## 2023-01-01 NOTE — Telephone Encounter (Signed)
Patient in home nurse assessment scheduled for June 24.2024 / patient receives 80 per month /angel hands [phone].

## 2023-01-01 NOTE — Progress Notes (Signed)
This chaplain is present with the Pt. for the notarizing of the Pt. AD. The chaplain understands the Pt. AD is in his chart.  The chaplain observed the Pt. inability to stay awake without the chaplain's touch on the shoulder. The Pt. is unable to answer the chaplain's clarifying questions. The chaplain updated the Pt. RN-Rasheen and will attempt a revisit later today.  Chaplain Stephanie Acre [phone]

## 2023-01-01 NOTE — Progress Notes (Signed)
Spoke with the patient's contact Richelle Ito after he consented.  I notified her that the patient would like to come home with home health therapy and avoid going to a skilled nursing facility. She stated that they were all okay with him coming home and are all willing to help him be safe. She reported that somebody is home all day with him such as herself or her son.  She also stated that his girlfriend comes over and helps quite often.  I reviewed the care that he will be needing at home and care that he may be needing at home in the future such as increased assistance with activities of daily living.

## 2023-01-01 NOTE — Progress Notes (Addendum)
IP PROGRESS NOTE  Subjective:   Mr. 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  He is currently being treated with second line paclitaxel/ramucirumab therapy.  He was last treated 11/30/2022.  Treatment was held on 12/20/2022 when he presented with generalized weakness.  He received intravenous fluids and reports feeling better. He presented to the emergency room on 12/27/2022 with hypotension, hypoxia, and hypoglycemia..  He appeared septic and there was evidence of aspiration pneumonia.  He was admitted to the ICU and placed on broad-spectrum antibiotics and pressor support.  A respiratory panel and blood cultures returned negative. His clinical status improved over the next several days.  He was weaned off of pressor support and transferred to the floor.  Mr. Wittkopp has no complaint today.  He met with the palliative care team to discuss CODE STATUS and goals of care.  Mr. Ulland indicates he would like to continue treatment of the cancer. Objective: Vital signs in last 24 hours: Blood pressure 114/89, pulse 79, temperature 97.9 F (36.6 C), temperature source Oral, resp. rate 17, weight 136 lb 14.5 oz (62.1 kg), SpO2 97 %.  Intake/Output from previous day: 07/07 0701 - 07/08 0700 In: 852.4 [P.O.:370; IV Piggyback:482.4] Out: 1200 [Urine:1200]  Physical Exam:  HEENT: No thrush Lungs: Clear bilaterally, no respiratory distress Cardiac: Regular rate and rhythm Abdomen: Nontender, no mass, no hepatosplenomegaly Extremities: No right leg edema Lymph nodes: No cervical, supraclavicular, axillary, or inguinal nodes  Portacath/PICC-without erythema  Lab Results: Recent Labs    12/31/22 0556 01/01/23 0500  WBC 5.0 5.2  HGB 10.2* 10.5*  HCT 32.5* 33.4*  PLT 76* 82*    BMET Recent Labs    12/31/22 0556 01/01/23 0500  NA 135 137  K 3.3* 3.7  CL 107 108  CO2 21* 21*  GLUCOSE 73 84  BUN 11 12  CREATININE 0.82 0.82   CALCIUM 7.8* 8.0*    Lab Results  Component Value Date   CEA1 287.0 (H) 02/11/2022   CEA 201.20 (H) 12/20/2022   CAN199 88 (H) 02/11/2022    Studies/Results: DG Chest Port 1 View  Result Date: 12/30/2022 CLINICAL DATA:  Pneumonia EXAM: PORTABLE CHEST 1 VIEW COMPARISON:  12/28/2022 FINDINGS: Right chest port remains in place. Stable cardiomegaly. Aortic atherosclerosis. Subtle hazy bilateral perihilar opacities, slightly improved. No large pleural fluid collection. No pneumothorax. IMPRESSION: Subtle hazy bilateral perihilar opacities, slightly improved. Electronically Signed   By: Duanne Guess D.O.   On: 12/30/2022 12:04    Medications: I have reviewed the patient's current medications.  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 -02/11/2022 CTA chest-7.8 x 8.5 cm subtle hypoattenuating mass lesion in segment IV of the liver. -02/11/2022 CT abdomen/pelvis-8.7 x 7.8 or ill-defined irregular mass in the medial segment left liver, 4.5 x 3.4 cm necrotic mass lesion just cranial to the esophagogastric junction with metastatic lymphadenopathy in the abdomen and pelvis, irregular nodular peritoneal thickening in the lower abdomen and pelvis -02/11/2022 MRI abdomen-rim-enhancing likely necrotic mass or lymph node conglomerate centered around the gastroesophageal junction measuring 5.0 x 3.9 cm, large rim-enhancing likely necrotic liver mass centered in the anterior left lobe of the liver measuring 7.8 x 7.2 cm, enlarged gastrohepatic ligament and retroperitoneal lymph nodes -Labs from 02/11/2022-AFP 2.4, CA 19.9 88, CEA 287 -Ultrasound-guided liver biopsy performed 02/15/2022-adenocarcinoma with extensive necrosis, CK7 positive, negative for CK20, CDX2, TTF-1,  and PAX8, consistent with a pancreatic, lung, or upper gastrointestinal primary -PET  03/08/2022-hypermetabolic distal esophagus mass, hypermetabolic centrally necrotic hepatic mass, hypermetabolic periportal nodes, exophytic GE junction mass without hypermetabolism-likely benign lesion, no hypermetabolism in the pancreas head, bilateral hypermetabolic adrenal gland lesions, small hypermetabolic left parotid gland lesion.  No loss of mismatch repair protein expression, HER2 3+ -CTs 03/11/2022-no change from PET 03/08/2022, known distal esophagus mass-not visualized on unenhanced CT, large hepatic metastasis and upper abdominal nodal metastases, chronic pancreatitis with 4.3 cm pseudocyst in the pancreas head -Cycle 1 FOLFOX, trastuzumab, Pembrolizumab 04/06/2022 -Cycle 2 oxaliplatin, 5-FU bolus/pump and trastuzumab held due to hospitalization following cycle 1 with possible coronary vasospasm -Pembrolizumab 04/25/2022 -CT abdomen/pelvis 05/02/2022-increased peripancreatic edema, increased size of pancreas head pseudocyst, increased necrotic nodal masses at the GE junction -Cycle 3 oxaliplatin, trastuzumab, pembrolizumab 05/15/2022; 5-FU held secondary to cardiac toxicity following cycle 1 -Cycle 4 oxaliplatin, trastuzumab 05/29/2022, 5-FU held secondary to cardiac toxicity -Cycle 5 oxaliplatin, trastuzumab, Pembrolizumab (6-week dosing for Pembrolizumab) 06/12/2022, 5-FU held secondary to cardiac toxicity -Cycle 6 oxaliplatin, trastuzumab 06/28/2022, 5-FU held -Cycle 7 oxaliplatin, trastuzumab 07/17/2022, 5-FU held -CTs 07/19/2022-decrease in the dominant anterior liver mass, 2 new small liver lesions compared to 05/02/2022, new right lower lobe nodule, enlargement of epicardial lymph nodes, slight decrease in size of celiac axis, gastropathic ligament, and portacaval lymphadenopathy, unchanged esophageal thickening -Cycle 8 oxaliplatin and pembrolizumab 08/14/2022, 5-FU and trastuzumab held secondary to potential cardiac toxicity -CTs 09/12/2022-increased number hepatic metastases, few are larger;  multiple abnormal lymph nodes identified in the abdomen and lower mediastinum, relatively similar; increased stranding along the mesentery and retroperitoneum; persistent wall thickening of the duodenum with slight ectasia of the lumen; persistent left hepatic lobe biliary duct dilatation, likely compressive from adjacent metastatic lesions; previous 6 mm right lower lobe lung nodule obscured by motion. -Cycle 1 Taxol/cyramza 09/20/2022 -Cycle 2 Taxol/Cyramza 10/04/2022 -Cycle 3 Taxol/Cyramza 10/19/2022 (Taxol dose reduced secondary to thrombocytopenia) -Cycle 4 Taxol/cyramza 11/16/2022 -Cycle 5 Taxol/Cyramza 11/30/2022 -CT chest emergency department 12/17/2022 due to complaint of dyspnea-no enlarged mediastinal, hilar or axillary lymph nodes.  New 7 mm lung nodule seen within the superior segment right lower lobe.  Additional 9 mm posterior right lower lobe lung nodule, previously measured 5 mm on prior study.  Small bilateral pleural effusions.  Multiple liver masses of various sizes, present on the prior exam.  4.6 x 3.7 low-attenuation extending from the posterior aspect of the left lobe of the liver to the region of the gastric fundus, previously measured 4.8 x 3.9 cm   2.  Myocardial infarction/cardiac arrest 01/31/2022 -Echocardiogram 02/01/2022-LVEF 60-65%, right ventricle severely dilated with hypokinesis in the basal and midportion, hypokinesis in the apical portion, right ventricular systolic function moderately reduced -Echocardiogram on 05/01/2022, LVEF 50-55%, decreased right ventricular systolic function, moderately enlarged right ventricle -Echocardiogram 08/05/2022-LVEF 45-50%, mildly decreased left ventricular function, moderate reduced right ventricular function 3.  Microcytic anemia -Labs from 02/11/2022-ferritin 151, iron 13, percent saturation 5% 4.  DVT/pulmonary embolism-bilateral lower extremity DVTs 01/10/2022, pulmonary emboli on chest CT 01/31/2022-apixaban 5.  PAD status post AKA in  2021 6.  RA 7.  Bilateral pleural effusions -Ultrasound-guided thoracentesis performed on 02/11/2022.  Cytology negative for malignancy. 8.  COPD 9.  Admission 03/11/2022 with E. coli bacteremia Ultrasound-guided biopsy/aspiration of dominant necrotic appearing liver lesion 03/17/2022-scant fluid aspirated-negative culture, no organisms or white cells.  Pathology with necrotic tissue. 10.  Admission 04/29/2022 after a fall, multiple skull fractures 11.  Fever, hypotension-Klebsiella bacteremia on blood culture  04/30/2022 12.  Admission 04/09/2022 with chest pain, NSTEMI 13.  Admission 08/04/2022 with ventricular tachycardia, status post cardioversion, noted to have hypokalemia and hypomagnesemia, started on amiodarone 14.  Rash following cycle 3 Taxol/ramucirumab-admitted to [facility] due to concern for SJS.  SJS and disseminated zoster ruled out. 15.  Admission 12/27/2022 with sepsis syndrome, probable aspiration pneumonia  Mr Acres has metastatic adenocarcinoma, likely of gastroesophageal origin.  He is currently being treated with second line paclitaxel/ramucirumab.  There has been no clear evidence of disease progression while on this regimen.  The CEA was lower when he he was seen on 12/20/2022.  Mr. Dziadosz is admitted with sepsis syndrome, likely following an aspiration event.  He has not reported significant dysphagia.  GI feels he is not safe to undergo endoscopy (we have consulted GI on 2 occasions).  I agree with the plan for an esophagram to better assess for a gastroesophageal mass and look for evidence of significant obstruction.  I discussed the prognosis with Mr. Kingcade.  He understands the metastatic adenocarcinoma is incurable.  I estimate his lifespan to be months as opposed to years with regard to the cancer diagnosis.  The goal of systemic therapy is to palliate symptoms and potentially extend survival. Mr. Breidinger has multiple comorbid conditions.  He is lifespan is as likely to be limited by  these conditions (cardiac) as from the cancer.  He indicated he wants to continue treatment of the cancer for now.  We will plan to continue paclitaxel/ramucirumab.  A no CODE STATUS is very reasonable in his case, but he has recovered from multiple acute events including cardiac arrest makes this discussion more difficult.  Recommendations: Follow-up with the cancer center with the next cycle of paclitaxel/ramucirumab will be scheduled for the week of 01/01/2023 Esophagram, diet per speech pathology Continue apixaban anticoagulation Please call oncology as needed Continue CODE STATUS/goals of care discussion with palliative care   LOS: 5 days   Thornton Papas, MD   01/01/2023, 6:26 AM

## 2023-01-01 NOTE — Plan of Care (Signed)

## 2023-01-02 ENCOUNTER — Telehealth: Payer: Self-pay

## 2023-01-02 ENCOUNTER — Other Ambulatory Visit: Payer: Self-pay | Admitting: Student

## 2023-01-02 NOTE — Discharge Summary (Signed)
Name: [name] [name] MRN: [id number] DOB: August 29, 1960 62 y.o. PCP: Rocky Morel, DO  Date of Admission: 12/27/2022 12:59 PM Date of Discharge:  01/01/2023 Attending Physician: Dr. Criselda Peaches  Discharge Diagnosis: Active Problems:   Pneumonia due to infectious organism   AKI (acute kidney injury) (HCC)   Sepsis (HCC)   Metabolic acidosis    Discharge Medications: Allergies as of 01/01/2023       Reactions   Dilaudid [hydromorphone Hcl] Rash   Ramipril Rash, Other (See Comments)   Per patient, rash was on the right arm and leg only; no angioedema   Tramadol Itching, Rash        Medication List     TAKE these medications    albuterol 108 (90 Base) MCG/ACT inhaler Commonly known as: VENTOLIN HFA Inhale 2 puffs into the lungs every 6 (six) hours as needed for wheezing or shortness of breath.   amiodarone 200 MG tablet Commonly known as: Pacerone Take 1 tablet (200 mg total) by mouth 2 (two) times daily.   amLODipine 10 MG tablet Commonly known as: NORVASC Take 1 tablet (10 mg total) by mouth daily.   apixaban 5 MG Tabs tablet Commonly known as: ELIQUIS Take 1 tablet (5 mg total) by mouth 2 (two) times daily.   atorvastatin 40 MG tablet Commonly known as: LIPITOR Take 1 tablet (40 mg total) by mouth daily at 6 PM.   clopidogrel 75 MG tablet Commonly known as: PLAVIX Take 75 mg by mouth daily.   diclofenac Sodium 1 % Gel Commonly known as: VOLTAREN Apply 2 g topically 4 (four) times daily.   DULoxetine 30 MG capsule Commonly known as: CYMBALTA Take 30 mg by mouth daily as needed (sleep).   feeding supplement Liqd Take 237 mLs by mouth 3 (three) times daily between meals.   ferrous sulfate 325 (65 FE) MG tablet Take 1 tablet (325 mg total) by mouth daily with breakfast for 100 doses.   fluticasone-salmeterol 45-21 MCG/ACT inhaler Commonly known as: Advair HFA Inhale 2 puffs into the lungs 2 (two) times daily.   folic acid 1 MG tablet Commonly known as:  FOLVITE Take 1 tablet (1 mg total) by mouth daily.   gabapentin 300 MG capsule Commonly known as: NEURONTIN Take 1 capsule (300 mg total) by mouth 2 (two) times daily. Daily or twice daily if tolerated.   isosorbide mononitrate 30 MG 24 hr tablet Commonly known as: IMDUR Take 1 tablet (30 mg total) by mouth daily.   lidocaine-prilocaine cream Commonly known as: EMLA Apply 1 Application topically as directed. Apply to port site 2 hours prior to stick and cover with Press-and-Seal to numb port before access   loperamide 2 MG tablet Commonly known as: IMODIUM A-D Take 2 mg by mouth 4 (four) times daily as needed for diarrhea or loose stools.   meloxicam 7.5 MG tablet Commonly known as: MOBIC Take 1 tablet (7.5 mg total) by mouth daily as needed for pain.   methotrexate 2.5 MG tablet Commonly known as: RHEUMATREX Take 3 tablets (7.5 mg total) by mouth once a week. Caution:Chemotherapy. Protect from light.   ondansetron 8 MG tablet Commonly known as: ZOFRAN START TAKING 72 HOURS AFTER CHEMOTHERAPY TREATMENT. What changed: See the new instructions.   Oxycodone HCl 10 MG Tabs Take 10 mg by mouth every 6 (six) hours.   potassium chloride 10 MEQ tablet Commonly known as: Klor-Con M10 TAKE 2 TABLETS(20 MEQ) BY MOUTH DAILY What changed:  how much to take how to  take this when to take this additional instructions   prochlorperazine 10 MG tablet Commonly known as: COMPAZINE TAKE 1 TABLET (10 MG TOTAL) BY MOUTH EVERY 6 (SIX) HOURS AS NEEDED. What changed: reasons to take this        Disposition and follow-up:   KyleLeamon T [name] was discharged from [facility] in Stable condition.  At the hospital follow up visit please address:  1.  Follow-up:  *Sepsis, secondary to aspiration pneumonia, likely due to esophageal dysmotility -Please ensure patient is receiving physical therapy, Occupational Therapy, speech-language therapy -Please ensure patient has  adequate help at home and feels safe at home during hospitalization follow up -Please ensure patient is adhering to a thin liquid/pured diet -Please check patient's blood pressure  *Esophageal cancer, stage IV -Please ensure patient is following up with oncologist -Please ensure patient has adequate support going forward with treatment -Please ensure patient is receiving physical therapy, Occupational Therapy, speech-language therapy -Please ensure patient has adequate help at home and feels safe at home during hospitalization follow up  *Esophageal motility, decreased -Esophagram showed decreased esophageal motility -Recommended dedicated swallow study with speech pathologist    2.  Labs / imaging needed at time of follow-up: CBC,BMP   3.  Pending labs/ test needing follow-up: N/A  4.  Medication Changes  STOPPED  -none     ADDED  -none    MODIFIED  -none     Follow-up Appointments: Please follow-up with family doctor within 7 to 10 days by Please follow-up with oncologist on January 08, 2023 Please follow-up with cardiology   Follow-up Information     [facility] Follow up.   Why: HHRN/PT/OT/aide services to resume on return home (adding Speech services as well)- they will contact you to schedule                Hospital Course by problem list: #Sepsis secondary to aspiration pneumonia Patient presented to the emergency department due to feeling short of breath, sick, and not himself with a BP of 76/40 per EMS. Patient received IVF and was diagnosed with acute hypoxemic respiratory failure likely due to aspiration pneumonia.  On the day of presentation, he received a chest x-ray which showed increased interstitial opacities, which may reflect mild pulmonary edema. The following day, chest x-ray showed hazy opacities bilateral predominantly perihilar and bibasilar, most likely CHF/pulmonary edema and Pneumonia considered less likely and stable  cardiomegaly. During his hospitalization in the ICU,he received pressors to help with his hypotension.  His home regimen of hypertensive medications were held during this hospitalization.  He was stable to admit to the floors on 7/6.  He completed an antibiotic regimen of ceftriaxone and doxycycline on July 7 and 8.  His blood cultures did not have growth after 5 days.    #Esophageal cancer, stage IV He received a goals of care discussion with palliative care where he wishes to continue remaining full code.  He would also like to continue his chemotherapy regimen with his oncologist.  He received an esophagram which did show esophageal dysmotility.  Although it was recommended that he either goes to a skilled nursing facility or home health therapy, he chose home health therapy.  He is aware that he will have PT/OT/SLP/aide come in and help.  We did discuss this with the roommates who are willing to offer support and assistance.  #AKI # Metabolic acidosis His initial creatinine level on presentation was 2.10, it did decrease to 0.82  upon discharge.  Likely due to prerenal due to the hypotension he experienced. His lactate was elevated at 2.6 on initial presentation decreased to 2.5.     Discharge Subjective:  Today, he reported adequate pain relief with the scheduled regimen.  He reports choking/coughing while eating.  He denied coughing except for the cough and he experiences with eating.  We had a discussion on what he would like to do after speaking with the oncologist.  He reported that he would like to continue the chemotherapy and that he would like to remain full code.  He was able to explain what that meant.  The patient also would like to go home and not go to a SNF.  He reports having adequate support at home with his roommates.   I stopped back to see the patient, gave me permission to call his roommates to discuss the level of support at home.  I went through the risk and benefits of going  home versus going to a skilled nursing facility.  He is aware of the risks associated with going home.   Discharge Exam:   Blood pressure 114/80, pulse 83, temperature 97.8 F (36.6 C), temperature source Oral, resp. rate 15, weight 62.1 kg, SpO2 94 %.  Constitutional:non ill-appearing comfortably sitting in bed, in no acute distress HENT: normocephalic atraumatic, mucous membranes moist Cardiovascular: regular rate and rhythm, no m/r/g,  Pulmonary/Chest: normal work of breathing on room air, lungs clear to auscultation bilaterally. nocrackles  Neurological: alert & oriented x 3 Skin: warm and dry Psych: Normal mood and affect  Pertinent Labs, Studies, and Procedures:     Latest Ref Rng & Units 01/01/2023    5:00 AM 12/31/2022    5:56 AM 12/30/2022    6:36 AM  CBC  WBC 4.0 - 10.5 K/uL 5.2  5.0  5.6   Hemoglobin 13.0 - 17.0 g/dL 16.1  09.6  9.5   Hematocrit 39.0 - 52.0 % 33.4  32.5  30.7   Platelets 150 - 400 K/uL 82  76  68        Latest Ref Rng & Units 01/01/2023    5:00 AM 12/31/2022    5:56 AM 12/30/2022    6:00 AM  CMP  Glucose 70 - 99 mg/dL 84  73  77   BUN 8 - 23 mg/dL 12  11  14    Creatinine 0.61 - 1.24 mg/dL 0.45  4.09  8.11   Sodium 135 - 145 mmol/L 137  135  137   Potassium 3.5 - 5.1 mmol/L 3.7  3.3  3.3   Chloride 98 - 111 mmol/L 108  107  102   CO2 22 - 32 mmol/L 21  21  20    Calcium 8.9 - 10.3 mg/dL 8.0  7.8  7.9     DG Chest Port 1 View  Result Date: 12/28/2022 CLINICAL DATA:  Aspiration pneumonia. EXAM: PORTABLE CHEST 1 VIEW COMPARISON:  Chest x-rays dated 12/27/2022 and 12/17/2022 FINDINGS: Stable cardiomegaly. RIGHT chest wall Port-A-Cath is stable in position. Hazy opacities bilaterally, predominantly perihilar and bibasilar. No pleural effusion or pneumothorax is seen. No acute-appearing osseous abnormality. IMPRESSION: 1. Hazy opacities bilaterally, predominantly perihilar and bibasilar, most likely CHF/pulmonary edema. Pneumonia considered less likely. 2. Stable  cardiomegaly. Electronically Signed   By: Bary [name] M.D.   On: 12/28/2022 09:31   ECHOCARDIOGRAM COMPLETE  Result Date: 12/27/2022    ECHOCARDIOGRAM REPORT   Patient Name:   [name] [name] Date of Exam: 12/27/2022  Medical Rec #:  [id number]     Height:       68.0 in Accession #:    [id number]    Weight:       136.0 lb Date of Birth:  08-10-1960    BSA:          1.735 m Patient Age:    61 years      BP:           106/64 mmHg Patient Gender: M             HR:           55 bpm. Exam Location:  Inpatient Procedure: 2D Echo, Cardiac Doppler and Color Doppler                       STAT ECHO  Results communicated to Dr Wynona Neat at 1807 on 12/27/22. Indications:     Chemo. Other cardiac sounds.  History:         Patient has prior history of Echocardiogram examinations, most                  recent 08/05/2022. CAD, Cancer. Sepsis.; Risk                  Factors:Hypertension, Dyslipidemia and Former Smoker.  Sonographer:     Delcie Roch RDCS Referring Phys:  [id number] Tomma Lightning Diagnosing Phys: Epifanio Lesches MD  Sonographer Comments: Image acquisition challenging due to respiratory motion. IMPRESSIONS  1. Left ventricular ejection fraction, by estimation, is 50 to 55%. The left ventricle has low normal function. The left ventricle has no regional wall motion abnormalities. There is mild left ventricular hypertrophy. Left ventricular diastolic parameters are indeterminate.  2. Right ventricular systolic function is moderately reduced. The right ventricular size is moderately enlarged. There is mildly elevated pulmonary artery systolic pressure. The estimated right ventricular systolic pressure is 40.4 mmHg.  3. Right atrial size was moderately dilated.  4. The mitral valve is normal in structure. No evidence of mitral valve regurgitation.  5. The aortic valve was not well visualized. Aortic valve regurgitation is trivial. Aortic valve sclerosis/calcification is present, without any evidence of aortic  stenosis.  6. The inferior vena cava is dilated in size with <50% respiratory variability, suggesting right atrial pressure of 15 mmHg. FINDINGS  Left Ventricle: Left ventricular ejection fraction, by estimation, is 50 to 55%. The left ventricle has low normal function. The left ventricle has no regional wall motion abnormalities. The left ventricular internal cavity size was normal in size. There is mild left ventricular hypertrophy. Left ventricular diastolic parameters are indeterminate. Right Ventricle: The right ventricular size is moderately enlarged. Right vetricular wall thickness was not well visualized. Right ventricular systolic function is moderately reduced. There is mildly elevated pulmonary artery systolic pressure. The tricuspid regurgitant velocity is 2.52 m/s, and with an assumed right atrial pressure of 15 mmHg, the estimated right ventricular systolic pressure is 40.4 mmHg. Left Atrium: Left atrial size was normal in size. Right Atrium: Right atrial size was moderately dilated. Pericardium: There is no evidence of pericardial effusion. Mitral Valve: The mitral valve is normal in structure. No evidence of mitral valve regurgitation. Tricuspid Valve: The tricuspid valve is normal in structure. Tricuspid valve regurgitation is mild. Aortic Valve: The aortic valve was not well visualized. Aortic valve regurgitation is trivial. Aortic valve sclerosis/calcification is present, without any evidence of aortic stenosis. Aortic valve mean gradient measures 6.0 mmHg. Aortic valve  peak gradient measures 11.6 mmHg. Aortic valve area, by VTI measures 2.25 cm. Pulmonic Valve: The pulmonic valve was not well visualized. Pulmonic valve regurgitation is not visualized. Aorta: The aortic root is normal in size and structure. Venous: The inferior vena cava is dilated in size with less than 50% respiratory variability, suggesting right atrial pressure of 15 mmHg. IAS/Shunts: The interatrial septum was not well  visualized.  LEFT VENTRICLE PLAX 2D LVIDd:         4.90 cm   Diastology LVIDs:         3.50 cm   LV e' medial:    9.90 cm/s LV PW:         1.20 cm   LV E/e' medial:  7.8 LV IVS:        0.80 cm   LV e' lateral:   17.10 cm/s LVOT diam:     2.10 cm   LV E/e' lateral: 4.5 LV SV:         51 LV SV Index:   30 LVOT Area:     3.46 cm  RIGHT VENTRICLE             IVC RV Basal diam:  3.10 cm     IVC diam: 2.60 cm RV S prime:     13.60 cm/s TAPSE (M-mode): 1.4 cm LEFT ATRIUM             Index        RIGHT ATRIUM           Index LA diam:        3.60 cm 2.08 cm/m   RA Area:     24.10 cm LA Vol (A2C):   47.2 ml 27.21 ml/m  RA Volume:   80.20 ml  46.23 ml/m LA Vol (A4C):   51.3 ml 29.57 ml/m LA Biplane Vol: 50.0 ml 28.82 ml/m  AORTIC VALVE AV Area (Vmax):    1.75 cm AV Area (Vmean):   1.74 cm AV Area (VTI):     2.25 cm AV Vmax:           170.00 cm/s AV Vmean:          109.000 cm/s AV VTI:            0.228 m AV Peak Grad:      11.6 mmHg AV Mean Grad:      6.0 mmHg LVOT Vmax:         85.70 cm/s LVOT Vmean:        54.700 cm/s LVOT VTI:          0.148 m LVOT/AV VTI ratio: 0.65  AORTA Ao Root diam: 3.50 cm MITRAL VALVE               TRICUSPID VALVE MV Area (PHT): 2.77 cm    TR Peak grad:   25.4 mmHg MV Decel Time: 274 msec    TR Vmax:        252.00 cm/s MV E velocity: 77.10 cm/s MV A velocity: 23.00 cm/s  SHUNTS MV E/A ratio:  3.35        Systemic VTI:  0.15 m                            Systemic Diam: 2.10 cm Epifanio Lesches MD Electronically signed by Epifanio Lesches MD Signature Date/Time: 12/27/2022/6:06:07 PM    Final (Updated)    DG Chest Port 1 View  Result Date:  12/27/2022 CLINICAL DATA:  Confusion EXAM: PORTABLE CHEST 1 VIEW COMPARISON:  12/17/2022 FINDINGS: Right chest port with catheter tip overlying the superior cavoatrial junction. Unchanged cardiac and mediastinal contours. Aortic atherosclerosis. Slightly increased interstitial opacities. No pleural effusion or pneumothorax. No acute osseous  abnormality. IMPRESSION: Slightly increased interstitial opacities, which may reflect mild pulmonary edema. Electronically Signed   By: Wiliam Ke M.D.   On: 12/27/2022 13:41     Discharge Instructions: Discharge Instructions     (HEART FAILURE PATIENTS) Call MD:  Anytime you have any of the following symptoms: 1) 3 pound weight gain in 24 hours or 5 pounds in 1 week 2) shortness of breath, with or without a dry hacking cough 3) swelling in the hands, feet or stomach 4) if you have to sleep on extra pillows at night in order to breathe.   Complete by: As directed    Call MD for:  difficulty breathing, headache or visual disturbances   Complete by: As directed    Call MD for:  extreme fatigue   Complete by: As directed    Call MD for:  hives   Complete by: As directed    Call MD for:  persistant dizziness or light-headedness   Complete by: As directed    Call MD for:  persistant nausea and vomiting   Complete by: As directed    Call MD for:  redness, tenderness, or signs of infection (pain, swelling, redness, odor or green/yellow discharge around incision site)   Complete by: As directed    Call MD for:  severe uncontrolled pain   Complete by: As directed    Call MD for:  temperature >100.4   Complete by: As directed    Diet full liquid   Complete by: As directed    Continue to eat thin pured liquids to limit choking   Discharge instructions   Complete by: As directed    Patient Instructions:   You came to the hospital after your roommate noticed that you had a change in confusion and that you appeared gray.  You are diagnosed with a serious infection called sepsis from potentially choking on your food while eating.  You are treated with antibiotics, extra fluids, and medications to help you your blood pressures from getting too low.   *For your sepsis -Physical therapy will be seeing you at home to help you recover -Occupational Therapy will help you at home as well -You  received antibiotics in the hospital.  You will not be on antibiotics when you go home -Please follow-up with your family doctor in 7 to 10 days -Please participate in physical therapy and Occupational Therapy along with the speech-language pathologist  *For your metastatic esophageal cancer, dysphagia -Please continue following up with your oncologist -Please continue with physical therapy and Occupational Therapy, this is the home health therapy that we are setting up with -Home health aide will also be visiting you at your home -Please continue to take your medications as previously prescribed by your doctor   *For your aspiration pneumonia -Speech-language pathology will be seeing you at home to assist with swallowing -Please continue to eat purees and thin liquids and this has been an easier texture for him to tolerate. -Please sit upright while eating -Please try to eat with your roommates near   Please follow-up with oncologist within 7 days or sooner Please follow-up with primary care doctor within 7 days or sooner Please take your previously prescribed medications as instructed by your  doctor If you have any questions or concerns please contact us at our internal medicine office phone number is [phone]   You had any of the following symptoms please go to the emergency department:  -Chest Pain -Fever -Chills -Shortness of breath -Nausea, vomiting -Severe fatigue or feeling unwell -Dizziness -Syncope (passing out) -Coughing with fevers    It was a pleasure take care of you during your stay, we are glad that you are feeling better! Take care,   Faith Rogue DO   Increase activity slowly   Complete by: As directed        Signed: Faith Rogue DO [facility] - PGY1 Pager: [phone] 01/02/2023, 5:07 PM    Please contact the on call pager after 5 pm and on weekends at [phone].

## 2023-01-02 NOTE — Progress Notes (Signed)
Pt's wife called after hours line stating his blood pressure is currently 91/66. She states that he is currently asymptomatic and denies any symptoms such as fevers, chills, nausea, vomiting, chest pain, or shortness of breath. Pt was recently discharged from the hospital for sepsis that did require ICU admission for pressors. Recommend pt drink fluids, and if he does develop symptoms as those mentioned above to call EMS for transport to ED for emergent evaluation.

## 2023-01-02 NOTE — TOC Transition Note (Signed)
Transition of Care (TOC) - CM/SW Discharge Note Donn Pierini RN, BSN Transitions of Care Unit 4E- RN Case Manager See Treatment Team for direct phone #   Patient Details  Name: [name] [name] MRN: [id number] Date of Birth: 1961-06-19  Transition of Care [facility]) CM/SW Contact:  Darrold Span, RN Phone Number: 01/02/2023, 9:53 AM   Clinical Narrative:    Per attending team pt to d/c home today- updated HH orders to add SLP [facility] liaison aware and confirmed they can service for SLP as well.   Pt will d/c home later when friends come to transport home.    Final next level of care: Home w Home Health Services Barriers to Discharge: Barriers Resolved   Patient Goals and CMS Choice CMS Medicare.gov Compare Post Acute Care list provided to:: Patient Choice offered to / list presented to : Patient  Discharge Placement                 Home w/ [facility]        Discharge Plan and Services Additional resources added to the After Visit Summary for     Discharge Planning Services: CM Consult Post Acute Care Choice: Home Health, Resumption of Svcs/PTA Provider          DME Arranged: N/A DME Agency: NA       HH Arranged: RN, PT, OT, Speech Therapy, Nurse's Aide HH Agency: [facility] Date HH Agency Contacted: 01/01/23 Time HH Agency Contacted: 1130 Representative spoke with at [facility] Agency: [place]  Social Determinants of Health (SDOH) Interventions SDOH Screenings   Food Insecurity: No Food Insecurity (11/10/2022)  Housing: Low Risk  (11/10/2022)  Transportation Needs: Unmet Transportation Needs (11/10/2022)  Utilities: Not At Risk (11/10/2022)  Alcohol Screen: Low Risk  (11/10/2022)  Depression (PHQ2-9): Medium Risk (11/10/2022)  Financial Resource Strain: Low Risk  (11/10/2022)  Physical Activity: Insufficiently Active (11/10/2022)  Social Connections: Moderately Isolated (11/10/2022)  Stress: No Stress Concern Present (11/10/2022)  Tobacco Use: Medium Risk (12/27/2022)      Readmission Risk Interventions    01/01/2023    4:52 PM 08/09/2022   11:59 AM 05/02/2022    4:00 PM  Readmission Risk Prevention Plan  Transportation Screening Complete Complete Complete  Medication Review Oceanographer) Complete Complete Complete  HRI or Home Care Consult Complete Complete Complete  SW Recovery Care/Counseling Consult Complete Complete Complete  Palliative Care Screening Not Applicable Not Applicable Not Applicable  Skilled Nursing Facility Patient Refused Not Applicable Complete

## 2023-01-02 NOTE — Progress Notes (Signed)
Spoke with patient's roommate [name] (who he gave me consent to speak with as well yesterday) and reminded him to please check patient's blood pressure once per day or if he feels lightheaded since we started his blood pressure medications again.  That if he does feel lightheaded or if the blood pressures are less than 90/60 and to please call with family doctor and hold off on the pressure medications for the day. He agreed.

## 2023-01-02 NOTE — Transitions of Care (Post Inpatient/ED Visit) (Signed)
   01/02/2023  Name: [name] [name] MRN: [id number] DOB: Sep 21, 1960  Today's TOC FU Call Status: Today's TOC FU Call Status:: Unsuccessul Call (1st Attempt) Unsuccessful Call (1st Attempt) Date: 01/02/23  Attempted to reach the patient regarding the most recent Inpatient/ED visit.  Follow Up Plan: Additional outreach attempts will be made to reach the patient to complete the Transitions of Care (Post Inpatient/ED visit) call.   Jodelle Gross, RN, BSN, CCM Care Management Coordinator [place] Phone: [phone]/Fax: [phone]

## 2023-01-03 ENCOUNTER — Telehealth: Payer: Self-pay

## 2023-01-03 NOTE — Transitions of Care (Post Inpatient/ED Visit) (Signed)
   01/03/2023  Name: [name] [name] MRN: [id number] DOB: [birth year]  Today's TOC FU Call Status: Today's TOC FU Call Status:: Unsuccessful Call (2nd Attempt) Unsuccessful Call (2nd Attempt) Date: 01/03/23  Attempted to reach the patient regarding the most recent Inpatient/ED visit.  Follow Up Plan: Additional outreach attempts will be made to reach the patient to complete the Transitions of Care (Post Inpatient/ED visit) call.   Jodelle Gross, RN, BSN, CCM Care Management Coordinator [place] Phone: [phone]/Fax: [phone]

## 2023-01-04 ENCOUNTER — Other Ambulatory Visit: Payer: Self-pay

## 2023-01-04 ENCOUNTER — Telehealth: Payer: Self-pay

## 2023-01-04 DIAGNOSIS — Z993 Dependence on wheelchair: Secondary | ICD-10-CM | POA: Diagnosis not present

## 2023-01-04 DIAGNOSIS — Z791 Long term (current) use of non-steroidal anti-inflammatories (NSAID): Secondary | ICD-10-CM | POA: Diagnosis not present

## 2023-01-04 DIAGNOSIS — M1990 Unspecified osteoarthritis, unspecified site: Secondary | ICD-10-CM | POA: Diagnosis not present

## 2023-01-04 DIAGNOSIS — M069 Rheumatoid arthritis, unspecified: Secondary | ICD-10-CM | POA: Diagnosis not present

## 2023-01-04 DIAGNOSIS — G894 Chronic pain syndrome: Secondary | ICD-10-CM | POA: Diagnosis not present

## 2023-01-04 DIAGNOSIS — I739 Peripheral vascular disease, unspecified: Secondary | ICD-10-CM | POA: Diagnosis not present

## 2023-01-04 DIAGNOSIS — C159 Malignant neoplasm of esophagus, unspecified: Secondary | ICD-10-CM | POA: Diagnosis not present

## 2023-01-04 DIAGNOSIS — E785 Hyperlipidemia, unspecified: Secondary | ICD-10-CM | POA: Diagnosis not present

## 2023-01-04 DIAGNOSIS — J439 Emphysema, unspecified: Secondary | ICD-10-CM | POA: Diagnosis not present

## 2023-01-04 DIAGNOSIS — R918 Other nonspecific abnormal finding of lung field: Secondary | ICD-10-CM | POA: Diagnosis not present

## 2023-01-04 DIAGNOSIS — I252 Old myocardial infarction: Secondary | ICD-10-CM | POA: Diagnosis not present

## 2023-01-04 DIAGNOSIS — D630 Anemia in neoplastic disease: Secondary | ICD-10-CM | POA: Diagnosis not present

## 2023-01-04 DIAGNOSIS — I493 Ventricular premature depolarization: Secondary | ICD-10-CM | POA: Diagnosis not present

## 2023-01-04 DIAGNOSIS — Z8673 Personal history of transient ischemic attack (TIA), and cerebral infarction without residual deficits: Secondary | ICD-10-CM | POA: Diagnosis not present

## 2023-01-04 DIAGNOSIS — K219 Gastro-esophageal reflux disease without esophagitis: Secondary | ICD-10-CM | POA: Diagnosis not present

## 2023-01-04 DIAGNOSIS — M5137 Other intervertebral disc degeneration, lumbosacral region: Secondary | ICD-10-CM | POA: Diagnosis not present

## 2023-01-04 DIAGNOSIS — J449 Chronic obstructive pulmonary disease, unspecified: Secondary | ICD-10-CM | POA: Diagnosis not present

## 2023-01-04 DIAGNOSIS — Z7902 Long term (current) use of antithrombotics/antiplatelets: Secondary | ICD-10-CM | POA: Diagnosis not present

## 2023-01-04 DIAGNOSIS — I2510 Atherosclerotic heart disease of native coronary artery without angina pectoris: Secondary | ICD-10-CM | POA: Diagnosis not present

## 2023-01-04 DIAGNOSIS — I10 Essential (primary) hypertension: Secondary | ICD-10-CM | POA: Diagnosis not present

## 2023-01-04 DIAGNOSIS — C787 Secondary malignant neoplasm of liver and intrahepatic bile duct: Secondary | ICD-10-CM | POA: Diagnosis not present

## 2023-01-04 DIAGNOSIS — F32A Depression, unspecified: Secondary | ICD-10-CM | POA: Diagnosis not present

## 2023-01-04 DIAGNOSIS — G931 Anoxic brain damage, not elsewhere classified: Secondary | ICD-10-CM | POA: Diagnosis not present

## 2023-01-04 DIAGNOSIS — E43 Unspecified severe protein-calorie malnutrition: Secondary | ICD-10-CM | POA: Diagnosis not present

## 2023-01-04 NOTE — Transitions of Care (Post Inpatient/ED Visit) (Signed)
   01/04/2023  Name: [name] [name] MRN: [id number] DOB: 1960/09/02  Today's TOC FU Call Status: Today's TOC FU Call Status:: Unsuccessful Call (3rd Attempt) Unsuccessful Call (3rd Attempt) Date: 01/04/23  Attempted to reach the patient regarding the most recent Inpatient/ED visit.  Follow Up Plan: No further outreach attempts will be made at this time. We have been unable to contact the patient.  Jodelle Gross, RN, BSN, CCM Care Management Coordinator /[facility] Phone: [phone]/Fax: [phone]

## 2023-01-07 ENCOUNTER — Other Ambulatory Visit: Payer: Self-pay | Admitting: Oncology

## 2023-01-07 DIAGNOSIS — C159 Malignant neoplasm of esophagus, unspecified: Secondary | ICD-10-CM

## 2023-01-07 DIAGNOSIS — C801 Malignant (primary) neoplasm, unspecified: Secondary | ICD-10-CM

## 2023-01-08 ENCOUNTER — Encounter: Payer: Self-pay | Admitting: *Deleted

## 2023-01-08 ENCOUNTER — Inpatient Hospital Stay (HOSPITAL_BASED_OUTPATIENT_CLINIC_OR_DEPARTMENT_OTHER): Payer: 59 | Admitting: Oncology

## 2023-01-08 ENCOUNTER — Inpatient Hospital Stay: Payer: 59

## 2023-01-08 ENCOUNTER — Ambulatory Visit: Payer: 59

## 2023-01-08 ENCOUNTER — Encounter: Payer: Self-pay | Admitting: Oncology

## 2023-01-08 ENCOUNTER — Other Ambulatory Visit: Payer: 59

## 2023-01-08 ENCOUNTER — Ambulatory Visit: Payer: 59 | Admitting: Nurse Practitioner

## 2023-01-08 ENCOUNTER — Ambulatory Visit: Payer: 59 | Admitting: Student

## 2023-01-08 VITALS — BP 116/76 | HR 62 | Resp 16

## 2023-01-08 VITALS — BP 98/56 | HR 73 | Temp 97.5°F | Resp 16 | Wt 134.9 lb

## 2023-01-08 DIAGNOSIS — C159 Malignant neoplasm of esophagus, unspecified: Secondary | ICD-10-CM

## 2023-01-08 DIAGNOSIS — C801 Malignant (primary) neoplasm, unspecified: Secondary | ICD-10-CM | POA: Diagnosis not present

## 2023-01-08 DIAGNOSIS — C787 Secondary malignant neoplasm of liver and intrahepatic bile duct: Secondary | ICD-10-CM | POA: Diagnosis not present

## 2023-01-08 DIAGNOSIS — R970 Elevated carcinoembryonic antigen [CEA]: Secondary | ICD-10-CM | POA: Diagnosis not present

## 2023-01-08 DIAGNOSIS — Z5111 Encounter for antineoplastic chemotherapy: Secondary | ICD-10-CM | POA: Diagnosis not present

## 2023-01-08 DIAGNOSIS — D509 Iron deficiency anemia, unspecified: Secondary | ICD-10-CM | POA: Diagnosis not present

## 2023-01-08 DIAGNOSIS — Z95828 Presence of other vascular implants and grafts: Secondary | ICD-10-CM

## 2023-01-08 DIAGNOSIS — Z79899 Other long term (current) drug therapy: Secondary | ICD-10-CM | POA: Diagnosis not present

## 2023-01-08 DIAGNOSIS — D696 Thrombocytopenia, unspecified: Secondary | ICD-10-CM | POA: Diagnosis not present

## 2023-01-08 LAB — CBC WITH DIFFERENTIAL (CANCER CENTER ONLY)
Abs Immature Granulocytes: 0.03 10*3/uL (ref 0.00–0.07)
Basophils Absolute: 0.1 10*3/uL (ref 0.0–0.1)
Basophils Relative: 1 %
Eosinophils Absolute: 0.4 10*3/uL (ref 0.0–0.5)
Eosinophils Relative: 5 %
HCT: 32 % — ABNORMAL LOW (ref 39.0–52.0)
Hemoglobin: 10 g/dL — ABNORMAL LOW (ref 13.0–17.0)
Immature Granulocytes: 0 %
Lymphocytes Relative: 16 %
Lymphs Abs: 1.1 10*3/uL (ref 0.7–4.0)
MCH: 27.9 pg (ref 26.0–34.0)
MCHC: 31.3 g/dL (ref 30.0–36.0)
MCV: 89.1 fL (ref 80.0–100.0)
Monocytes Absolute: 0.4 10*3/uL (ref 0.1–1.0)
Monocytes Relative: 6 %
Neutro Abs: 5.1 10*3/uL (ref 1.7–7.7)
Neutrophils Relative %: 72 %
Platelet Count: 130 10*3/uL — ABNORMAL LOW (ref 150–400)
RBC: 3.59 MIL/uL — ABNORMAL LOW (ref 4.22–5.81)
RDW: 18.3 % — ABNORMAL HIGH (ref 11.5–15.5)
WBC Count: 7.1 10*3/uL (ref 4.0–10.5)
nRBC: 0 % (ref 0.0–0.2)

## 2023-01-08 LAB — CMP (CANCER CENTER ONLY)
ALT: 15 U/L (ref 0–44)
AST: 26 U/L (ref 15–41)
Albumin: 2.9 g/dL — ABNORMAL LOW (ref 3.5–5.0)
Alkaline Phosphatase: 161 U/L — ABNORMAL HIGH (ref 38–126)
Anion gap: 6 (ref 5–15)
BUN: 16 mg/dL (ref 8–23)
CO2: 25 mmol/L (ref 22–32)
Calcium: 8.5 mg/dL — ABNORMAL LOW (ref 8.9–10.3)
Chloride: 107 mmol/L (ref 98–111)
Creatinine: 0.92 mg/dL (ref 0.61–1.24)
GFR, Estimated: >60 mL/min (ref >60)
Glucose, Bld: 80 mg/dL (ref 70–99)
Potassium: 4.5 mmol/L (ref 3.5–5.1)
Sodium: 138 mmol/L (ref 135–145)
Total Bilirubin: 0.4 mg/dL (ref 0.3–1.2)
Total Protein: 6.1 g/dL — ABNORMAL LOW (ref 6.5–8.1)

## 2023-01-08 LAB — CEA (ACCESS): CEA (CHCC): 191.5 ng/mL — ABNORMAL HIGH (ref 0.00–5.00)

## 2023-01-08 MED ORDER — HEPARIN SOD (PORK) LOCK FLUSH 100 UNIT/ML IV SOLN
500.0000 [IU] | Freq: Once | INTRAVENOUS | Status: AC | PRN
  Administered 2023-01-08: 500 [IU]

## 2023-01-08 MED ORDER — SODIUM CHLORIDE 0.9 % IV SOLN: Freq: Once | INTRAVENOUS | Status: AC

## 2023-01-08 MED ORDER — SODIUM CHLORIDE 0.9 % IV SOLN
10.0000 mg | Freq: Once | INTRAVENOUS | Status: AC
  Administered 2023-01-08: 10 mg via INTRAVENOUS
  Filled 2023-01-08: qty 10

## 2023-01-08 MED ORDER — DIPHENHYDRAMINE HCL 50 MG/ML IJ SOLN
50.0000 mg | Freq: Once | INTRAMUSCULAR | Status: AC
  Administered 2023-01-08: 50 mg via INTRAVENOUS
  Filled 2023-01-08: qty 1

## 2023-01-08 MED ORDER — SODIUM CHLORIDE 0.9% FLUSH
10.0000 mL | INTRAVENOUS | Status: DC | PRN
  Administered 2023-01-08: 10 mL via INTRAVENOUS

## 2023-01-08 MED ORDER — SODIUM CHLORIDE 0.9 % IV SOLN
60.0000 mg/m2 | Freq: Once | INTRAVENOUS | Status: AC
  Administered 2023-01-08: 102 mg via INTRAVENOUS
  Filled 2023-01-08: qty 17

## 2023-01-08 MED ORDER — SODIUM CHLORIDE 0.9 % IV SOLN
8.0000 mg/kg | Freq: Once | INTRAVENOUS | Status: AC
  Administered 2023-01-08: 500 mg via INTRAVENOUS
  Filled 2023-01-08: qty 50

## 2023-01-08 MED ORDER — FAMOTIDINE IN NACL 20-0.9 MG/50ML-% IV SOLN
20.0000 mg | Freq: Once | INTRAVENOUS | Status: AC
  Administered 2023-01-08: 20 mg via INTRAVENOUS
  Filled 2023-01-08: qty 50

## 2023-01-08 MED ORDER — PALONOSETRON HCL INJECTION 0.25 MG/5ML
0.2500 mg | Freq: Once | INTRAVENOUS | Status: AC
  Administered 2023-01-08: 0.25 mg via INTRAVENOUS
  Filled 2023-01-08: qty 5

## 2023-01-08 MED ORDER — SODIUM CHLORIDE 0.9% FLUSH
10.0000 mL | INTRAVENOUS | Status: DC | PRN
  Administered 2023-01-08: 10 mL

## 2023-01-08 NOTE — Progress Notes (Signed)
Provided patient #4 bottles of Ensure and coupons.

## 2023-01-08 NOTE — Progress Notes (Signed)
[facility] OFFICE PROGRESS NOTE   Diagnosis: Metastatic adenocarcinoma  INTERVAL HISTORY:   [name] [name] was discharged from the hospital 01/01/2023 after admission with respiratory failure and sepsis syndrome.  He was felt to have aspiration pneumonia.  He had an esophagram on 01/01/2023.  No aspiration was seen.  He was unable to pass a 13 mm barium tablet.  There was no evidence of a fixed stricture or mass.  [name] [name] denies dysphagia.  No specific complaint today.  He continues to have numbness in the hands.  He feels this is related to "arthritis ".  Objective:  Vital signs in last 24 hours:  Blood pressure (!) 82/57, pulse 73, temperature (!) 97.5 F (36.4 C), temperature source Temporal, resp. rate 16, weight 134 lb 14.4 oz (61.2 kg), SpO2 97%.    HEENT: No thrush or ulcers Resp: Distant breath sounds, no respiratory distress Cardio: Regular rate and rhythm GI: No hepatosplenomegaly, nontender, no mass Vascular: No right leg edema Neuro: The vibratory sense is intact in the fingertips bilaterally    Portacath/PICC-without erythema  Lab Results:  Lab Results  Component Value Date   WBC 5.2 01/01/2023   HGB 10.5 (L) 01/01/2023   HCT 33.4 (L) 01/01/2023   MCV 88.8 01/01/2023   PLT 82 (L) 01/01/2023   NEUTROABS 13.5 (H) 12/27/2022    CMP  Lab Results  Component Value Date   NA 137 01/01/2023   K 3.7 01/01/2023   CL 108 01/01/2023   CO2 21 (L) 01/01/2023   GLUCOSE 84 01/01/2023   BUN 12 01/01/2023   CREATININE 0.82 01/01/2023   CALCIUM 8.0 (L) 01/01/2023   PROT 5.8 (L) 12/28/2022   ALBUMIN 2.0 (L) 01/01/2023   AST 94 (H) 12/28/2022   ALT 49 (H) 12/28/2022   ALKPHOS 202 (H) 12/28/2022   BILITOT 0.9 12/28/2022   GFRNONAA >60 01/01/2023   GFRAA >60 01/24/2020    Lab Results  Component Value Date   CEA1 287.0 (H) 02/11/2022   CEA 201.20 (H) 12/20/2022   QIH474 88 (H) 02/11/2022     Medications: I have reviewed the patient's current  medications.   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 -02/11/2022 CTA chest-7.8 x 8.5 cm subtle hypoattenuating mass lesion in segment IV of the liver. -02/11/2022 CT abdomen/pelvis-8.7 x 7.8 or ill-defined irregular mass in the medial segment left liver, 4.5 x 3.4 cm necrotic mass lesion just cranial to the esophagogastric junction with metastatic lymphadenopathy in the abdomen and pelvis, irregular nodular peritoneal thickening in the lower abdomen and pelvis -02/11/2022 MRI abdomen-rim-enhancing likely necrotic mass or lymph node conglomerate centered around the gastroesophageal junction measuring 5.0 x 3.9 cm, large rim-enhancing likely necrotic liver mass centered in the anterior left lobe of the liver measuring 7.8 x 7.2 cm, enlarged gastrohepatic ligament and retroperitoneal lymph nodes -Labs from 02/11/2022-AFP 2.4, CA 19.9 88, CEA 287 -Ultrasound-guided liver biopsy performed 02/15/2022-adenocarcinoma with extensive necrosis, CK7 positive, negative for CK20, CDX2, TTF-1, and PAX8, consistent with a pancreatic, lung, or upper gastrointestinal primary -PET 03/08/2022-hypermetabolic distal esophagus mass, hypermetabolic centrally necrotic hepatic mass, hypermetabolic periportal nodes, exophytic GE junction mass without hypermetabolism-likely benign lesion, no hypermetabolism in the pancreas head, bilateral hypermetabolic adrenal gland lesions, small hypermetabolic left parotid gland lesion.  No loss of mismatch repair protein expression, HER2 3+ -CTs 03/11/2022-no change from PET 03/08/2022, known distal esophagus mass-not visualized on unenhanced CT, large  hepatic metastasis and upper abdominal nodal metastases, chronic pancreatitis with 4.3 cm pseudocyst in the pancreas head -Cycle 1 FOLFOX, trastuzumab, Pembrolizumab 04/06/2022 -Cycle 2  oxaliplatin, 5-FU bolus/pump and trastuzumab held due to hospitalization following cycle 1 with possible coronary vasospasm -Pembrolizumab 04/25/2022 -CT abdomen/pelvis 05/02/2022-increased peripancreatic edema, increased size of pancreas head pseudocyst, increased necrotic nodal masses at the GE junction -Cycle 3 oxaliplatin, trastuzumab, pembrolizumab 05/15/2022; 5-FU held secondary to cardiac toxicity following cycle 1 -Cycle 4 oxaliplatin, trastuzumab 05/29/2022, 5-FU held secondary to cardiac toxicity -Cycle 5 oxaliplatin, trastuzumab, Pembrolizumab (6-week dosing for Pembrolizumab) 06/12/2022, 5-FU held secondary to cardiac toxicity -Cycle 6 oxaliplatin, trastuzumab 06/28/2022, 5-FU held -Cycle 7 oxaliplatin, trastuzumab 07/17/2022, 5-FU held -CTs 07/19/2022-decrease in the dominant anterior liver mass, 2 new small liver lesions compared to 05/02/2022, new right lower lobe nodule, enlargement of epicardial lymph nodes, slight decrease in size of celiac axis, gastropathic ligament, and portacaval lymphadenopathy, unchanged esophageal thickening -Cycle 8 oxaliplatin and pembrolizumab 08/14/2022, 5-FU and trastuzumab held secondary to potential cardiac toxicity -CTs 09/12/2022-increased number hepatic metastases, few are larger; multiple abnormal lymph nodes identified in the abdomen and lower mediastinum, relatively similar; increased stranding along the mesentery and retroperitoneum; persistent wall thickening of the duodenum with slight ectasia of the lumen; persistent left hepatic lobe biliary duct dilatation, likely compressive from adjacent metastatic lesions; previous 6 mm right lower lobe lung nodule obscured by motion. -Cycle 1 Taxol/cyramza 09/20/2022 -Cycle 2 Taxol/Cyramza 10/04/2022 -Cycle 3 Taxol/Cyramza 10/19/2022 (Taxol dose reduced secondary to thrombocytopenia) -Cycle 4 Taxol/cyramza 11/16/2022 -Cycle 5 Taxol/Cyramza 11/30/2022 -CT chest emergency department 12/17/2022 due to complaint of  dyspnea-no enlarged mediastinal, hilar or axillary lymph nodes.  New 7 mm lung nodule seen within the superior segment right lower lobe.  Additional 9 mm posterior right lower lobe lung nodule, previously measured 5 mm on prior study.  Small bilateral pleural effusions.  Multiple liver masses of various sizes, present on the prior exam.  4.6 x 3.7 low-attenuation extending from the posterior aspect of the left lobe of the liver to the region of the gastric fundus, previously measured 4.8 x 3.9 cm -Cycle 6 Taxol/Cyramza 01/08/2023   2.  Myocardial infarction/cardiac arrest 01/31/2022 -Echocardiogram 02/01/2022-LVEF 60-65%, right ventricle severely dilated with hypokinesis in the basal and midportion, hypokinesis in the apical portion, right ventricular systolic function moderately reduced -Echocardiogram on 05/01/2022, LVEF 50-55%, decreased right ventricular systolic function, moderately enlarged right ventricle -Echocardiogram 08/05/2022-LVEF 45-50%, mildly decreased left ventricular function, moderate reduced right ventricular function 3.  Microcytic anemia -Labs from 02/11/2022-ferritin 151, iron 13, percent saturation 5% 4.  DVT/pulmonary embolism-bilateral lower extremity DVTs 01/10/2022, pulmonary emboli on chest CT 01/31/2022-apixaban 5.  PAD status post AKA in 2021 6.  RA 7.  Bilateral pleural effusions -Ultrasound-guided thoracentesis performed on 02/11/2022.  Cytology negative for malignancy. 8.  COPD 9.  Admission 03/11/2022 with E. coli bacteremia Ultrasound-guided biopsy/aspiration of dominant necrotic appearing liver lesion 03/17/2022-scant fluid aspirated-negative culture, no organisms or white cells.  Pathology with necrotic tissue. 10.  Admission 04/29/2022 after a fall, multiple skull fractures 11.  Fever, hypotension-Klebsiella bacteremia on blood culture 04/30/2022 12.  Admission 04/09/2022 with chest pain, NSTEMI 13.  Admission 08/04/2022 with ventricular tachycardia, status post  cardioversion, noted to have hypokalemia and hypomagnesemia, started on amiodarone 14.  Rash following cycle 3 Taxol/ramucirumab-admitted to [facility] due to concern for SJS.  SJS and disseminated zoster ruled out. 15.  Admission 12/27/2022 with sepsis syndrome, probable aspiration pneumonia     Disposition: [name] [name] has recovered from the  recent hospital admission with respiratory failure.  He appears at baseline today.  I discussed treatment options with him while hospitalized last week.  He would like to continue treatment of the metastatic adenocarcinoma.  There is no clear clinical or radiologic evidence of disease progression.  The plan is to continue Taxol/ramucirumab.  He will complete another cycle today.  [name] [name] will return for an office visit and chemotherapy in 2 weeks.  The CEA is lower again today.  [name] Papas, MD  01/08/2023  10:31 AM

## 2023-01-08 NOTE — Progress Notes (Signed)
Patient seen by Dr. Truett Perna today  Vitals are within treatment parameters.BP 98/56--  Labs reviewed by Dr. Truett Perna and are within treatment parameters.  Per physician team, patient is ready for treatment and there are NO modifications to the treatment plan.

## 2023-01-08 NOTE — Patient Instructions (Signed)
[facility]  Discharge Instructions: Thank you for choosing [facility] to provide your oncology and hematology care.   If you have a lab appointment with the Cancer Center, please go directly to the Cancer Center and check in at the registration area.   Wear comfortable clothing and clothing appropriate for easy access to any Portacath or PICC line.   We strive to give you quality time with your provider. You may need to reschedule your appointment if you arrive late (15 or more minutes).  Arriving late affects you and other patients whose appointments are after yours.  Also, if you miss three or more appointments without notifying the office, you may be dismissed from the clinic at the provider's discretion.      For prescription refill requests, have your pharmacy contact our office and allow 72 hours for refills to be completed.    Today you received the following chemotherapy and/or immunotherapy agents: ramucirumab, paclitaxel      To help prevent nausea and vomiting after your treatment, we encourage you to take your nausea medication as directed.  BELOW ARE SYMPTOMS THAT SHOULD BE REPORTED IMMEDIATELY: *FEVER GREATER THAN 100.4 F (38 C) OR HIGHER *CHILLS OR SWEATING *NAUSEA AND VOMITING THAT IS NOT CONTROLLED WITH YOUR NAUSEA MEDICATION *UNUSUAL SHORTNESS OF BREATH *UNUSUAL BRUISING OR BLEEDING *URINARY PROBLEMS (pain or burning when urinating, or frequent urination) *BOWEL PROBLEMS (unusual diarrhea, constipation, pain near the anus) TENDERNESS IN MOUTH AND THROAT WITH OR WITHOUT PRESENCE OF ULCERS (sore throat, sores in mouth, or a toothache) UNUSUAL RASH, SWELLING OR PAIN  UNUSUAL VAGINAL DISCHARGE OR ITCHING   Items with * indicate a potential emergency and should be followed up as soon as possible or go to the Emergency Department if any problems should occur.  Please show the CHEMOTHERAPY ALERT CARD or IMMUNOTHERAPY ALERT  CARD at check-in to the Emergency Department and triage nurse.  Should you have questions after your visit or need to cancel or reschedule your appointment, please contact [place]  Dept: [phone]  and follow the prompts.  Office hours are 8:00 a.m. to 4:30 p.m. Monday - Friday. Please note that voicemails left after 4:00 p.m. may not be returned until the following business day.  We are closed weekends and major holidays. You have access to a nurse at all times for urgent questions. Please call the main number to the clinic Dept: [phone] and follow the prompts.   For any non-urgent questions, you may also contact your provider using MyChart. We now offer e-Visits for anyone 4 and older to request care online for non-urgent symptoms. For details visit [email or web address].   Also download the MyChart app! Go to the app store, search "MyChart", open the app, select [facility], and log in with your MyChart username and password.  Ramucirumab Injection What is this medication? RAMUCIRUMAB (ra mue SIR ue mab) treats some types of cancer. It works by blocking a protein that causes cancer cells to grow and multiply. This helps to slow or stop the spread of cancer cells. It is a monoclonal antibody. This medicine may be used for other purposes; ask your health care provider or pharmacist if you have questions. COMMON BRAND NAME(S): Cyramza What should I tell my care team before I take this medication? They need to know if you have any of these conditions: Blood clots Having or recent surgery Heart attack High blood pressure History of a  tear in your stomach or intestines Liver disease Protein in your urine Stomach bleeding Stroke Thyroid disease An unusual or allergic reaction to ramucirumab, other medications, foods, dyes, or preservatives Pregnant or trying to get pregnant Breast-feeding How should I use this medication? This medication is  injected into a vein. It is given by your care team in a hospital or clinic setting. Talk to your care team about the use of this medication in children. Special care may be needed. Overdosage: If you think you have taken too much of this medicine contact a poison control center or emergency room at once. NOTE: This medicine is only for you. Do not share this medicine with others. What if I miss a dose? Keep appointments for follow-up doses. It is important not to miss your dose. Call your care team if you are unable to keep an appointment. What may interact with this medication? Interactions have not been studied. This list may not describe all possible interactions. Give your health care provider a list of all the medicines, herbs, non-prescription drugs, or dietary supplements you use. Also tell them if you smoke, drink alcohol, or use illegal drugs. Some items may interact with your medicine. What should I watch for while using this medication? Your condition will be monitored carefully while you are receiving this medication. You may need blood work while taking this medication. This medication may make you feel generally unwell. This is not uncommon as chemotherapy can affect health cells as well as cancer cells. Report any side effects. Continue your course of treatment even though you feel ill unless your care team tells you to stop. This medication may increase your risk to bruise or bleed. Call your care team if you notice any unusual bleeding. Before having surgery, talk to your care team to make sure it is ok. This medication can increase the risk of poor healing of your surgical site or wound. You will need to stop this medication for 28 days before surgery. After surgery, wait at least 2 weeks before restarting this medication. Make sure the surgical site or wound is healed enough before restarting this medication. Talk to your care team if questions. Talk to your care team if you may be  pregnant. Serious birth defects can occur if you take this medication during pregnancy and for 3 months after the last dose. You will need a negative pregnancy test before starting this medication. Contraception is recommended while taking this medication and for 3 months after the last dose. Your care team can help you find the option that works for you. Do not breastfeed while taking this medication and for 2 months after the last dose. This medication may cause infertility. Talk to your care team if you are concerned about your fertility. What side effects may I notice from receiving this medication? Side effects that you should report to your care team as soon as possible: Allergic reactions--skin rash, itching, hives, swelling of the face, lips, tongue, or throat Bleeding--bloody or black, tar-like stools, vomiting blood or brown material that looks like coffee grounds, red or dark brown urine, small red or purple spots on skin, unusual bruising or bleeding Dizziness, loss of balance or coordination, confusion or trouble speaking Heart attack--pain or tightness in the chest, shoulders, arms, or jaw, nausea, shortness of breath, cold or clammy skin, feeling faint or lightheaded Increase in blood pressure Infection--fever, chills, cough, sore throat, wounds that don't heal, pain or trouble when passing urine, general feeling of discomfort  or being unwell Infusion reactions--chest pain, shortness of breath or trouble breathing, feeling faint or lightheaded Kidney injury--decrease in the amount of urine, swelling of the ankles, hands, or feet Liver injury--right upper belly pain, loss of appetite, nausea, light-colored stool, dark yellow or brown urine, yellowing skin or eyes, unusual weakness or fatigue Low thyroid levels (hypothyroidism)--unusual weakness or fatigue, increased sensitivity to cold, constipation, hair loss, dry skin, weight gain, feelings of depression Stomach pain that is severe,  does not go away, or gets worse Stroke--sudden numbness or weakness of the face, arm, or leg, trouble speaking, confusion, trouble walking, loss of balance or coordination, dizziness, severe headache, change in vision Sudden and severe headache, confusion, change in vision, seizures, which may be signs of posterior reversible encephalopathy syndrome (PRES) Side effects that usually do not require medical attention (report to your care team if they continue or are bothersome): Diarrhea Fatigue Stomach pain Swelling of the ankles, hands, or feet This list may not describe all possible side effects. Call your doctor for medical advice about side effects. You may report side effects to FDA at 1-800-FDA-1088. Where should I keep my medication? This medication is given in a hospital or clinic. It will not be stored at home. NOTE: This sheet is a summary. It may not cover all possible information. If you have questions about this medicine, talk to your doctor, pharmacist, or health care provider.  2024 Elsevier/Gold Standard (2021-11-03 00:00:00)  Paclitaxel Injection What is this medication? PACLITAXEL (PAK li TAX el) treats some types of cancer. It works by slowing down the growth of cancer cells. This medicine may be used for other purposes; ask your health care provider or pharmacist if you have questions. COMMON BRAND NAME(S): Onxol, Taxol What should I tell my care team before I take this medication? They need to know if you have any of these conditions: Heart disease Liver disease Low white blood cell levels An unusual or allergic reaction to paclitaxel, other medications, foods, dyes, or preservatives If you or your partner are pregnant or trying to get pregnant Breast-feeding How should I use this medication? This medication is injected into a vein. It is given by your care team in a hospital or clinic setting. Talk to your care team about the use of this medication in children. While  it may be given to children for selected conditions, precautions do apply. Overdosage: If you think you have taken too much of this medicine contact a poison control center or emergency room at once. NOTE: This medicine is only for you. Do not share this medicine with others. What if I miss a dose? Keep appointments for follow-up doses. It is important not to miss your dose. Call your care team if you are unable to keep an appointment. What may interact with this medication? Do not take this medication with any of the following: Live virus vaccines Other medications may affect the way this medication works. Talk with your care team about all of the medications you take. They may suggest changes to your treatment plan to lower the risk of side effects and to make sure your medications work as intended. This list may not describe all possible interactions. Give your health care provider a list of all the medicines, herbs, non-prescription drugs, or dietary supplements you use. Also tell them if you smoke, drink alcohol, or use illegal drugs. Some items may interact with your medicine. What should I watch for while using this medication? Your condition will be  monitored carefully while you are receiving this medication. You may need blood work while taking this medication. This medication may make you feel generally unwell. This is not uncommon as chemotherapy can affect healthy cells as well as cancer cells. Report any side effects. Continue your course of treatment even though you feel ill unless your care team tells you to stop. This medication can cause serious allergic reactions. To reduce the risk, your care team may give you other medications to take before receiving this one. Be sure to follow the directions from your care team. This medication may increase your risk of getting an infection. Call your care team for advice if you get a fever, chills, sore throat, or other symptoms of a cold or flu.  Do not treat yourself. Try to avoid being around people who are sick. This medication may increase your risk to bruise or bleed. Call your care team if you notice any unusual bleeding. Be careful brushing or flossing your teeth or using a toothpick because you may get an infection or bleed more easily. If you have any dental work done, tell your dentist you are receiving this medication. Talk to your care team if you may be pregnant. Serious birth defects can occur if you take this medication during pregnancy. Talk to your care team before breastfeeding. Changes to your treatment plan may be needed. What side effects may I notice from receiving this medication? Side effects that you should report to your care team as soon as possible: Allergic reactions--skin rash, itching, hives, swelling of the face, lips, tongue, or throat Heart rhythm changes--fast or irregular heartbeat, dizziness, feeling faint or lightheaded, chest pain, trouble breathing Increase in blood pressure Infection--fever, chills, cough, sore throat, wounds that don't heal, pain or trouble when passing urine, general feeling of discomfort or being unwell Low blood pressure--dizziness, feeling faint or lightheaded, blurry vision Low red blood cell level--unusual weakness or fatigue, dizziness, headache, trouble breathing Painful swelling, warmth, or redness of the skin, blisters or sores at the infusion site Pain, tingling, or numbness in the hands or feet Slow heartbeat--dizziness, feeling faint or lightheaded, confusion, trouble breathing, unusual weakness or fatigue Unusual bruising or bleeding Side effects that usually do not require medical attention (report to your care team if they continue or are bothersome): Diarrhea Hair loss Joint pain Loss of appetite Muscle pain Nausea Vomiting This list may not describe all possible side effects. Call your doctor for medical advice about side effects. You may report side effects  to FDA at 1-800-FDA-1088. Where should I keep my medication? This medication is given in a hospital or clinic. It will not be stored at home. NOTE: This sheet is a summary. It may not cover all possible information. If you have questions about this medicine, talk to your doctor, pharmacist, or health care provider.  2024 Elsevier/Gold Standard (2021-11-01 00:00:00)

## 2023-01-08 NOTE — Progress Notes (Unsigned)
Patient seen by Dr. Truett Perna today  Vitals are not all within treatment parameters. BP 82/57  Labs reviewed by {Blank single:19197::"Dr. Sherrill","Lisa Thomas NP"} {Blank single:19197::"CBC diff reviewed and within treatment parameters, CMP pending.","and are not all within treatment parameters. ***","and are within treatment parameters."}  Per physician team, {Blank single:19197::"patient is ready for treatment. Please note that modifications are being made to the treatment plan including ***","patient will not be receiving treatment today.","patient is ready for treatment and there are NO modifications to the treatment plan."}

## 2023-01-09 ENCOUNTER — Ambulatory Visit: Payer: 59 | Admitting: Physician Assistant

## 2023-01-09 ENCOUNTER — Other Ambulatory Visit: Payer: Self-pay

## 2023-01-09 ENCOUNTER — Encounter: Payer: Self-pay | Admitting: Physician Assistant

## 2023-01-09 VITALS — BP 90/60 | HR 70

## 2023-01-09 DIAGNOSIS — Z7901 Long term (current) use of anticoagulants: Secondary | ICD-10-CM | POA: Diagnosis not present

## 2023-01-09 DIAGNOSIS — Z951 Presence of aortocoronary bypass graft: Secondary | ICD-10-CM

## 2023-01-09 DIAGNOSIS — C801 Malignant (primary) neoplasm, unspecified: Secondary | ICD-10-CM

## 2023-01-09 NOTE — Progress Notes (Signed)
____________________________________________________________  Attending physician addendum:  Thank you for sending this case to me. I have reviewed the entire note and agree with the plan.  I agree we need some clarification from oncology on their clinical impression and wishes for this patient because he is unquestionably at high risk for complications of endoscopic procedures with his recent aspiration pneumonia and anticoagulation.  Amada Jupiter, MD  ____________________________________________________________

## 2023-01-09 NOTE — Progress Notes (Signed)
Chief Complaint: History of metastatic adenocarcinoma, question of esophageal primary  HPI:    [name] is a 62 year old male with a past medical history as listed below including CAD, MI on Eliquis, DVT and multiple others, who was referred to me by Rocky Morel, DO for a history of metastatic adenocarcinoma with question of esophageal primary.    01/31/2022 MI/cardiac arrest.  Echocardiogram 02/01/2022 with LVEF 60-65%.  Repeat echo 08/05/2022 with LVEF 45-50% mildly decreased left ventricular function.    02/11/2022 CTA of the chest with a 7.8 x 8.5 cm subtle hypoattenuating mass lesion in segment 4 of the liver.  CTAP at that time with a 8.7 x 7.8 or ill-defined irregular mass in the medial segment of the left liver, 4.5 x 3.4 cm necrotic mass lesion just cranial to the EG junction with metastatic lymphadenopathy in the abdomen pelvis and irregular nodule peritoneal thickening in the lower abdomen and pelvis.    02/11/2022 MRI of the abdomen with rim-enhancing likely necrotic mass or lymph node conglomerate centered around the GE junction measuring 5.0 x 3.9 cm, large rim-enhancing likely necrotic liver mass centered in the anterior left lobe of the liver measuring 7.8 x 7.2 cm, enlarged gastropathic ligament and retroperitoneal lymph nodes.    02/11/2022 AFP 2.4, CA 19.988, CEA 287    02/15/2022 ultrasound-guided liver biopsy showed adenocarcinoma with extensive necrosis, consistent with a pancreatic, lung or upper GI primary.    03/08/2022 PET scan with hypermetabolic distal esophagus mass, hypermetabolic centrally necrotic hepatic mass, hypermetabolic periportal nodes, exophytic GE junction mass without hypermetabolism    04/06/2022 patient started on chemotherapy    04/09/2022 patient admitted with NSTEMI.    01/01/2023 hemoglobin 10.5, platelets 82.  CMP with an AST of 94 ALT 49 alk phos 202.    12/27/2022-01/01/2023-patient admitted with sepsis from likely aspiration pneumonia.  At that point had  esophagram showing decreased esophageal motility was recommended he have a dedicated swallow study with speech pathology.    01/08/2023 patient followed with the cancer center.  At that time noted that he had been discharged from the hospital 01/01/2023 after admission with respiratory failure and sepsis syndrome.  He was felt to have aspiration pneumonia.  Esophagram 01/01/2023 with no aspiration seen but unable to pass a 13 mm barium tablet with no evidence of a thick stricture or mass.  At that time discussed he had metastatic adenocarcinoma with likely esophageal primary.  He had a liver mass and a mass or lymph node conglomerate near the GE junction, gastrohepatic ligament and retroperitoneal lymphadenopathy.  At that time decision made to continue chemotherapy.    Today, patient presents to clinic accompanied by his roommates who assist with his care.  He has no acute complaints or concerns and they all tell me they are not exactly sure why he is here.  He just got started back on chemotherapy and is following with palliative care in late July.  He has no GI symptoms.    Denies fever, chills, dysphagia, nausea, vomiting, heartburn, reflux, abdominal pain or trouble with bowel movements.  Past Medical History:  Diagnosis Date   Acute respiratory failure (HCC) 01/31/2022   Bilateral shoulder pain 08/20/2012   Bite wound of left hand 09/27/2021   CAD (coronary artery disease)    Occluded nondominent RCA managed medically - August 2023   Cardiac arrest [facility])    OOH VF - August 2023   Community acquired pneumonia 11/07/2011   Critical limb ischemia with history of  revascularization of same extremity (HCC) 04/28/2020   DDD (degenerative disc disease), lumbosacral     and grade 2 slip   DVT (deep venous thrombosis) (HCC) 01/31/2022   Esophageal cancer (HCC)    Adenocarcinoma with metastasis   GERD (gastroesophageal reflux disease)    History of anemia    History of COVID-19 05/10/2019   History of  critical lower limb ischemia 02/19/2019   Hypertension    Impingement syndrome of shoulder region    Ischemic ulcer of toe of left foot (HCC)    Liver mass    Low back pain without sciatica    Lupus (HCC)    Multiple fractures of ribs, bilateral, due to CPR trauma    Onychomycosis    Paronychia of great toe    Peripheral vascular disease (HCC)    Pressure injury of skin    Pulmonary embolus (HCC)    RA (rheumatoid arthritis) (HCC)    Rotator cuff tear 11/04/2014   Sternal fracture    Stroke [facility])     Past Surgical History:  Procedure Laterality Date   ABDOMINAL AORTAGRAM  02/20/2019   ABDOMINAL AORTOGRAM W/LOWER EXTREMITY N/A 02/20/2019   Procedure: ABDOMINAL AORTOGRAM W/LOWER EXTREMITY;  Surgeon: Cephus Shelling, MD;  [facility];  Service: Cardiovascular;  Laterality: N/A;   ACHILLES TENDON SURGERY Bilateral    AMPUTATION Left 05/11/2020   Procedure: LEFT ABOVE KNEE AMPUTATION;  Surgeon: Chuck Hint, MD;  Location: [facility] OR;  Service: Vascular;  Laterality: Left;   ENDARTERECTOMY POPLITEAL Left 05/01/2020   Procedure: ENDARTERECTOMY POPLITEAL;  Surgeon: Maeola Harman, MD;  Location: [facility] OR;  Service: Vascular;  Laterality: Left;   FASCIOTOMY Left 05/01/2020   Procedure: FASCIOTOMY;  Surgeon: Maeola Harman, MD;  Location: [facility] OR;  Service: Vascular;  Laterality: Left;   FEMORAL-POPLITEAL BYPASS GRAFT Left 02/26/2019   Procedure: BYPASS GRAFT FEMORAL-POPLITEAL ARTERY LEFT LEG USING 6mm PROPATEN GRAFT;  Surgeon: Nada Libman, MD;  Location: [facility] OR;  Service: Vascular;  Laterality: Left;   INTRAOPERATIVE ARTERIOGRAM Left 01/22/2020   Procedure: INTRA OPERATIVE ARTERIOGRAM with administration of thrombolyics in Left femoral to popliteal bypass.;  Surgeon: Cephus Shelling, MD;  Location: [facility] OR;  Service: Vascular;  Laterality: Left;   IR IMAGING GUIDED PORT INSERTION  03/07/2022   LEFT HEART CATH AND CORONARY ANGIOGRAPHY N/A  03/15/2017   Procedure: LEFT HEART CATH AND CORONARY ANGIOGRAPHY;  Surgeon: Yvonne Kendall, MD;  [facility];  Service: Cardiovascular;  Laterality: N/A;   LEFT HEART CATH AND CORONARY ANGIOGRAPHY N/A 02/06/2022   Procedure: LEFT HEART CATH AND CORONARY ANGIOGRAPHY;  Surgeon: Swaziland, Peter M, MD;  Location: [facility] INVASIVE CV LAB;  Service: Cardiovascular;  Laterality: N/A;   LOWER EXTREMITY INTERVENTION Left 04/29/2020   Procedure: LOWER EXTREMITY INTERVENTION- LYSIS;  Surgeon: Cephus Shelling, MD;  [facility];  Service: Cardiovascular;  Laterality: Left;   OSTEOTOMY AND ULNAR SHORTENING Right 07/22/2002   PATCH ANGIOPLASTY Left 05/01/2020   Procedure: PATCH ANGIOPLASTY USING HEMASHIELD PLATINUM FINESSE PATCH;  Surgeon: Maeola Harman, MD;  Location: [facility] OR;  Service: Vascular;  Laterality: Left;   PERIPHERAL VASCULAR ATHERECTOMY  01/23/2020   Procedure: PERIPHERAL VASCULAR ATHERECTOMY;  Surgeon: Cephus Shelling, MD;  [facility];  Service: Cardiovascular;;   PERIPHERAL VASCULAR BALLOON ANGIOPLASTY  01/23/2020   Procedure: PERIPHERAL VASCULAR BALLOON ANGIOPLASTY;  Surgeon: Cephus Shelling, MD;  [facility];  Service: Cardiovascular;;  PERIPHERAL VASCULAR INTERVENTION Left 04/30/2020   Procedure: PERIPHERAL VASCULAR INTERVENTION;  Surgeon: Leonie Douglas, MD;  [facility];  Service: Cardiovascular;  Laterality: Left;   PERIPHERAL VASCULAR THROMBECTOMY N/A 01/23/2020   Procedure: lysis recheck;  Surgeon: Cephus Shelling, MD;  [facility];  Service: Cardiovascular;  Laterality: N/A;  + [place]umb[place]    PERIPHERAL VASCULAR THROMBECTOMY N/A 04/30/2020   Procedure: Lysis Recheck;  Surgeon: Leonie Douglas, MD;  [facility];  Service: Cardiovascular;  Laterality: N/A;   SHOULDER ARTHROSCOPY WITH BICEPSTENOTOMY Right 03/11/2015   Procedure: SHOULDER ARTHROSCOPY WITH  BICEPSTENOTOMY;  Surgeon: Loreta Ave, MD;  Location: [facility];  Service: Orthopedics;  Laterality: Right;   SHOULDER ARTHROSCOPY WITH DISTAL CLAVICLE RESECTION Left 11/19/2014   Procedure: SHOULDER ARTHROSCOPY WITH DISTAL CLAVICLE RESECTION;  Surgeon: Mckinley Jewel, MD;  Location: [facility];  Service: Orthopedics;  Laterality: Left;   SHOULDER ARTHROSCOPY WITH ROTATOR CUFF REPAIR AND SUBACROMIAL DECOMPRESSION Left 11/19/2014   Procedure: LEFT SHOULDER ARTHROSCOPY, DEBRIDEMENT DISTAL CLAVICLE EXCISION, ACROMIOPLASTY WITH ROTATOR CUFF REPAIR ;  Surgeon: Mckinley Jewel, MD;  Location: [facility];  Service: Orthopedics;  Laterality: Left;   THROMBECTOMY OF BYPASS GRAFT FEMORAL- POPLITEAL ARTERY Left 05/01/2020   Procedure: THROMBECTOMY OF LOWER EXTREMITY;  Surgeon: Maeola Harman, MD;  Location: [facility] OR;  Service: Vascular;  Laterality: Left;   TRANSFORAMINAL LUMBAR INTERBODY FUSION (TLIF) WITH PEDICLE SCREW FIXATION 1 LEVEL N/A 01/03/2018   Procedure: TRANSFORAMINAL LUMBAR INTERBODY FUSION (TLIF) LUMBAR FIVE-SACRAL ONE;  Surgeon: Venita Lick, MD;  [facility];  Service: Orthopedics;  Laterality: N/A;   ULTRASOUND GUIDANCE FOR VASCULAR ACCESS Right 01/22/2020   Procedure: ULTRASOUND GUIDANCE FOR VASCULAR ACCESS Right Common Femoral Artery.;  Surgeon: Cephus Shelling, MD;  [facility];  Service: Vascular;  Laterality: Right;   VIDEO ASSISTED THORACOSCOPY (VATS)/DECORTICATION Right 11/21/2011   drainage of empyema    Current Outpatient Medications  Medication Sig Dispense Refill   albuterol (VENTOLIN HFA) 108 (90 Base) MCG/ACT inhaler Inhale 2 puffs into the lungs every 6 (six) hours as needed for wheezing or shortness of breath. 6.7 g 2   amiodarone (PACERONE) 200 MG tablet Take 1 tablet (200 mg total) by mouth 2 (two) times daily. 60 tablet 11   amLODipine (NORVASC) 10 MG tablet Take 1 tablet (10 mg total) by mouth daily. 30 tablet  0   apixaban (ELIQUIS) 5 MG TABS tablet Take 1 tablet (5 mg total) by mouth 2 (two) times daily. 60 tablet 0   atorvastatin (LIPITOR) 40 MG tablet Take 1 tablet (40 mg total) by mouth daily at 6 PM. 30 tablet 0   clopidogrel (PLAVIX) 75 MG tablet Take 75 mg by mouth daily.     diclofenac Sodium (VOLTAREN) 1 % GEL Apply 2 g topically 4 (four) times daily. 200 g 0   DULoxetine (CYMBALTA) 30 MG capsule Take 30 mg by mouth daily as needed (sleep).     feeding supplement (ENSURE ENLIVE / ENSURE PLUS) LIQD Take 237 mLs by mouth 3 (three) times daily between meals. 237 mL 12   ferrous sulfate 325 (65 FE) MG tablet Take 1 tablet (325 mg total) by mouth daily with breakfast for 100 doses. 100 tablet 0   fluticasone-salmeterol (ADVAIR HFA) 45-21 MCG/ACT inhaler Inhale 2 puffs into the lungs 2 (two) times daily. 1 each 12   folic acid (FOLVITE) 1 MG tablet Take 1 tablet (1 mg total)  by mouth daily. (Patient not taking: Reported on 12/31/2022) 90 tablet 3   gabapentin (NEURONTIN) 300 MG capsule Take 1 capsule (300 mg total) by mouth 2 (two) times daily. Daily or twice daily if tolerated. 60 capsule 3   isosorbide mononitrate (IMDUR) 30 MG 24 hr tablet Take 1 tablet (30 mg total) by mouth daily. 90 tablet 3   lidocaine-prilocaine (EMLA) cream Apply 1 Application topically as directed. Apply to port site 2 hours prior to stick and cover with Press-and-Seal to numb port before access 30 g 1   loperamide (IMODIUM A-D) 2 MG tablet Take 2 mg by mouth 4 (four) times daily as needed for diarrhea or loose stools.     meloxicam (MOBIC) 7.5 MG tablet Take 1 tablet (7.5 mg total) by mouth daily as needed for pain. 30 tablet 3   methotrexate (RHEUMATREX) 2.5 MG tablet Take 3 tablets (7.5 mg total) by mouth once a week. Caution:Chemotherapy. Protect from light. 4 tablet 0   ondansetron (ZOFRAN) 8 MG tablet START TAKING 72 HOURS AFTER CHEMOTHERAPY TREATMENT. (Patient not taking: Reported on 01/08/2023) 30 tablet 1   Oxycodone  HCl 10 MG TABS Take 10 mg by mouth every 6 (six) hours.     potassium chloride (KLOR-CON M10) 10 MEQ tablet TAKE 2 TABLETS(20 MEQ) BY MOUTH DAILY (Patient taking differently: Take 10 mEq by mouth 2 (two) times daily.) 180 tablet 0   prochlorperazine (COMPAZINE) 10 MG tablet TAKE 1 TABLET (10 MG TOTAL) BY MOUTH EVERY 6 (SIX) HOURS AS NEEDED. (Patient taking differently: Take 10 mg by mouth every 6 (six) hours as needed (for Cancer Chemotherapy-Induced Nausea and Vomiting).) 60 tablet 1   No current facility-administered medications for this visit.    Allergies as of 01/09/2023 - Review Complete 01/08/2023  Allergen Reaction Noted   Dilaudid [hydromorphone hcl] Rash 05/08/2020   Ramipril Rash and Other (See Comments) 06/23/2011   Tramadol Itching and Rash 06/23/2011    Family History  Problem Relation Age of Onset   Heart attack Father 59   Alcohol abuse Father    Aneurysm Mother    Stroke Neg Hx    Cancer Neg Hx     Social History   Socioeconomic History   Marital status: Single    Spouse name: Not on file   Number of children: 0   Years of education: Not on file   Highest education level: Not on file  Occupational History   Occupation: Disabled    Comment: 2/2 RA  Tobacco Use   Smoking status: Former    Current packs/day: 0.00    Average packs/day: 0.3 packs/day for 20.0 years (5.0 ttl pk-yrs)    Types: Cigarettes    Start date: 02/19/1999    Quit date: 02/19/2019    Years since quitting: 3.8   Smokeless tobacco: Never   Tobacco comments:    Patient stated he smokes a couple day and is attempting to quit smoking  Vaping Use   Vaping status: Never Used  Substance and Sexual Activity   Alcohol use: No    Alcohol/week: 0.0 standard drinks of alcohol    Comment: sober 1998   Drug use: No   Sexual activity: Not on file  Other Topics Concern   Not on file  Social History Narrative   On disability since 1992, used to work with city of [place]. Quit drinking in 1990.       Current Social History 11/05/2019        Patient lives  by himself most of the time. Sometimes girlfriend's son stays with him in a one level home. There are 3 steps with handrail up to the entrance the patient uses.       Patient's method of transportation is personal truck. This was recently vandalized and patient doesn't have funds to repair at this time.      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      Identified important Relationships are "My girlfriend, Selena Batten."       Pets : American Terrier Archivist), Lab (Roxy)       Interests / Fun: Walk, watch TV       Current Stressors: "Going through a lot the last 6-7 years; I worry too much about my health." (Discussed IBH, patient not interested at this time.)      Religious / Personal Beliefs: Baptist       L. Ducatte, BSN, RN-BC    Social Determinants of Health   Financial Resource Strain: Low Risk  (11/10/2022)   Overall Financial Resource Strain (CARDIA)    Difficulty of Paying Living Expenses: Not hard at all  Food Insecurity: No Food Insecurity (11/10/2022)   Hunger Vital Sign    Worried About Running Out of Food in the Last Year: Never true    Ran Out of Food in the Last Year: Never true  Transportation Needs: Unmet Transportation Needs (11/10/2022)   PRAPARE - Administrator, Civil Service (Medical): Yes    Lack of Transportation (Non-Medical): Yes  Physical Activity: Insufficiently Active (11/10/2022)   Exercise Vital Sign    Days of Exercise per Week: 2 days    Minutes of Exercise per Session: 10 min  Stress: No Stress Concern Present (11/10/2022)   [employer] of Occupational Health - Occupational Stress Questionnaire    Feeling of Stress : Not at all  Social Connections: Moderately Isolated (11/10/2022)   Social Connection and Isolation Panel [NHANES]    Frequency of Communication with Friends and Family: Twice a week    Frequency of Social  Gatherings with Friends and Family: More than three times a week    Attends Religious Services: Never    Database administrator or Organizations: No    Attends Banker Meetings: Never    Marital Status: Living with partner  Intimate Partner Violence: Not At Risk (11/10/2022)   Humiliation, Afraid, Rape, and Kick questionnaire    Fear of Current or Ex-Partner: No    Emotionally Abused: No    Physically Abused: No    Sexually Abused: No    Review of Systems:    Constitutional: No fever or chills Skin: No rash  Cardiovascular: No chest pain Respiratory: No SOB  Gastrointestinal: See HPI and otherwise negative Genitourinary: No dysuria  Neurological: No headache, dizziness or syncope Musculoskeletal: No new muscle or joint pain Hematologic: No bleeding  Psychiatric: No history of depression or anxiety   Physical Exam:  Vital signs: BP 90/60   Pulse 70    Constitutional:  Caucasian male appears to be in NAD, Well developed, Well nourished, alert and cooperative Head:  Normocephalic and atraumatic. Eyes:   PEERL, EOMI. No icterus. Conjunctiva pink. Ears:  Normal auditory acuity. Neck:  Supple Throat: Oral cavity and pharynx without inflammation, swelling or lesion.  Respiratory: Respirations even and unlabored. Lungs clear to auscultation bilaterally.   No wheezes, crackles, or rhonchi.  Cardiovascular: Normal S1,  S2. No MRG. Regular rate and rhythm. No peripheral edema, cyanosis or pallor.  Gastrointestinal:  Soft, nondistended, nontender. No rebound or guarding. Normal bowel sounds. No appreciable masses or hepatomegaly. Rectal:  Not performed.  Msk:  Symmetrical without gross deformities. Without edema, no deformity or joint abnormality. +in wheelchair Neurologic:  Alert and  oriented x4;  grossly normal neurologically.  Skin:   Dry and intact without significant lesions or rashes. Psychiatric: Demonstrates good judgement and reason without abnormal affect or  behaviors.  RELEVANT LABS AND IMAGING: CBC    Component Value Date/Time   WBC 7.1 01/08/2023 0942   WBC 5.2 01/01/2023 0500   RBC 3.59 (L) 01/08/2023 0942   HGB 10.0 (L) 01/08/2023 0942   HGB 11.4 (L) 06/15/2022 1458   HCT 32.0 (L) 01/08/2023 0942   HCT 36.1 (L) 06/15/2022 1458   PLT 130 (L) 01/08/2023 0942   PLT 133 (L) 06/15/2022 1458   MCV 89.1 01/08/2023 0942   MCV 84 06/15/2022 1458   MCH 27.9 01/08/2023 0942   MCHC 31.3 01/08/2023 0942   RDW 18.3 (H) 01/08/2023 0942   RDW 19.4 (H) 06/15/2022 1458   LYMPHSABS 1.1 01/08/2023 0942   LYMPHSABS 1.1 06/15/2022 1458   MONOABS 0.4 01/08/2023 0942   EOSABS 0.4 01/08/2023 0942   EOSABS 0.2 06/15/2022 1458   BASOSABS 0.1 01/08/2023 0942   BASOSABS 0.1 06/15/2022 1458    CMP     Component Value Date/Time   NA 138 01/08/2023 0942   NA 135 08/16/2022 1524   K 4.5 01/08/2023 0942   CL 107 01/08/2023 0942   CO2 25 01/08/2023 0942   GLUCOSE 80 01/08/2023 0942   BUN 16 01/08/2023 0942   BUN 21 08/16/2022 1524   CREATININE 0.92 01/08/2023 0942   CREATININE 0.80 11/27/2012 0956   CALCIUM 8.5 (L) 01/08/2023 0942   PROT 6.1 (L) 01/08/2023 0942   PROT 6.9 06/15/2022 1458   ALBUMIN 2.9 (L) 01/08/2023 0942   ALBUMIN 3.3 (L) 06/15/2022 1458   AST 26 01/08/2023 0942   ALT 15 01/08/2023 0942   ALKPHOS 161 (H) 01/08/2023 0942   BILITOT 0.4 01/08/2023 0942   GFRNONAA >60 01/08/2023 0942   GFRNONAA >89 11/27/2012 0956   GFRAA >60 01/24/2020 0202   GFRAA >89 11/27/2012 0956    Assessment: 1.  Metastatic adenocarcinoma: Presumed esophageal primary with liver mass, undergoing chemotherapy with Dr. Truett Perna 2.  Aspiration pneumonia: While in the hospital had esophagram which showed dysmotility which is likely contributing, no further symptoms per the patient 3.  History of MI and NSTEMI: Both within the past year, currently on Plavix and Eliquis  Plan: 1.  I will reach out to Dr. Truett Perna to see if an EGD would still be helpful in  directing his care.  I cannot tell from his notes if this is needed now or not as this referral was sent more than 4 months ago. 2.  Pending Dr. Kalman Drape response if patient needs EGD he would need this to be done in the hospital and will discuss further with Dr. Myrtie Neither in regards to holding his Plavix and Eliquis, certainly he would be very high risk for any anesthesia and even higher risk if we need to stop his anticoagulants 3.  Patient to follow in clinic per recommendations after Dr. Truett Perna and Dr. Irving Burton review.  Hyacinth Meeker, PA-C [facility] 01/09/2023, 2:23 PM  Cc: Rocky Morel, DO

## 2023-01-09 NOTE — Patient Instructions (Signed)
We will call if we determine that you need Endoscopy.   _______________________________________________________  If your blood pressure at your visit was 140/90 or greater, please contact your primary care physician to follow up on this.  _______________________________________________________  If you are age 63 or older, your body mass index should be between 23-30. Your There is no height or weight on file to calculate BMI. If this is out of the aforementioned range listed, please consider follow up with your Primary Care Provider.  If you are age 6 or younger, your body mass index should be between 19-25. Your There is no height or weight on file to calculate BMI. If this is out of the aformentioned range listed, please consider follow up with your Primary Care Provider.   __________________________________________________________  The [facility] providers would like to encourage you to use [facility] to communicate with providers for non-urgent requests or questions.  Due to long hold times on the telephone, sending your provider a message by [facility] may be a faster and more efficient way to get a response.  Please allow 48 business hours for a response.  Please remember that this is for non-urgent requests.    Thank you for choosing me and [facility].  Hyacinth Meeker, PA-C

## 2023-01-10 ENCOUNTER — Encounter: Payer: 59 | Admitting: Student

## 2023-01-10 DIAGNOSIS — Z791 Long term (current) use of non-steroidal anti-inflammatories (NSAID): Secondary | ICD-10-CM | POA: Diagnosis not present

## 2023-01-10 DIAGNOSIS — I739 Peripheral vascular disease, unspecified: Secondary | ICD-10-CM | POA: Diagnosis not present

## 2023-01-10 DIAGNOSIS — I2510 Atherosclerotic heart disease of native coronary artery without angina pectoris: Secondary | ICD-10-CM | POA: Diagnosis not present

## 2023-01-10 DIAGNOSIS — Z7902 Long term (current) use of antithrombotics/antiplatelets: Secondary | ICD-10-CM | POA: Diagnosis not present

## 2023-01-10 DIAGNOSIS — M1990 Unspecified osteoarthritis, unspecified site: Secondary | ICD-10-CM | POA: Diagnosis not present

## 2023-01-10 DIAGNOSIS — E785 Hyperlipidemia, unspecified: Secondary | ICD-10-CM | POA: Diagnosis not present

## 2023-01-10 DIAGNOSIS — C159 Malignant neoplasm of esophagus, unspecified: Secondary | ICD-10-CM | POA: Diagnosis not present

## 2023-01-10 DIAGNOSIS — F32A Depression, unspecified: Secondary | ICD-10-CM | POA: Diagnosis not present

## 2023-01-10 DIAGNOSIS — K219 Gastro-esophageal reflux disease without esophagitis: Secondary | ICD-10-CM | POA: Diagnosis not present

## 2023-01-10 DIAGNOSIS — R918 Other nonspecific abnormal finding of lung field: Secondary | ICD-10-CM | POA: Diagnosis not present

## 2023-01-10 DIAGNOSIS — I493 Ventricular premature depolarization: Secondary | ICD-10-CM | POA: Diagnosis not present

## 2023-01-10 DIAGNOSIS — G894 Chronic pain syndrome: Secondary | ICD-10-CM | POA: Diagnosis not present

## 2023-01-10 DIAGNOSIS — Z8673 Personal history of transient ischemic attack (TIA), and cerebral infarction without residual deficits: Secondary | ICD-10-CM | POA: Diagnosis not present

## 2023-01-10 DIAGNOSIS — E43 Unspecified severe protein-calorie malnutrition: Secondary | ICD-10-CM | POA: Diagnosis not present

## 2023-01-10 DIAGNOSIS — Z993 Dependence on wheelchair: Secondary | ICD-10-CM | POA: Diagnosis not present

## 2023-01-10 DIAGNOSIS — C787 Secondary malignant neoplasm of liver and intrahepatic bile duct: Secondary | ICD-10-CM | POA: Diagnosis not present

## 2023-01-10 DIAGNOSIS — M5137 Other intervertebral disc degeneration, lumbosacral region: Secondary | ICD-10-CM | POA: Diagnosis not present

## 2023-01-10 DIAGNOSIS — M069 Rheumatoid arthritis, unspecified: Secondary | ICD-10-CM | POA: Diagnosis not present

## 2023-01-10 DIAGNOSIS — I252 Old myocardial infarction: Secondary | ICD-10-CM | POA: Diagnosis not present

## 2023-01-10 DIAGNOSIS — G931 Anoxic brain damage, not elsewhere classified: Secondary | ICD-10-CM | POA: Diagnosis not present

## 2023-01-10 DIAGNOSIS — J439 Emphysema, unspecified: Secondary | ICD-10-CM | POA: Diagnosis not present

## 2023-01-10 DIAGNOSIS — J449 Chronic obstructive pulmonary disease, unspecified: Secondary | ICD-10-CM | POA: Diagnosis not present

## 2023-01-10 DIAGNOSIS — D630 Anemia in neoplastic disease: Secondary | ICD-10-CM | POA: Diagnosis not present

## 2023-01-10 DIAGNOSIS — I10 Essential (primary) hypertension: Secondary | ICD-10-CM | POA: Diagnosis not present

## 2023-01-11 DIAGNOSIS — J439 Emphysema, unspecified: Secondary | ICD-10-CM | POA: Diagnosis not present

## 2023-01-11 DIAGNOSIS — I471 Supraventricular tachycardia, unspecified: Secondary | ICD-10-CM | POA: Diagnosis not present

## 2023-01-11 DIAGNOSIS — J449 Chronic obstructive pulmonary disease, unspecified: Secondary | ICD-10-CM | POA: Diagnosis not present

## 2023-01-11 DIAGNOSIS — I739 Peripheral vascular disease, unspecified: Secondary | ICD-10-CM | POA: Diagnosis not present

## 2023-01-11 DIAGNOSIS — M069 Rheumatoid arthritis, unspecified: Secondary | ICD-10-CM | POA: Diagnosis not present

## 2023-01-12 ENCOUNTER — Telehealth: Payer: Self-pay | Admitting: Nurse Practitioner

## 2023-01-12 DIAGNOSIS — I252 Old myocardial infarction: Secondary | ICD-10-CM | POA: Diagnosis not present

## 2023-01-12 DIAGNOSIS — Z8673 Personal history of transient ischemic attack (TIA), and cerebral infarction without residual deficits: Secondary | ICD-10-CM | POA: Diagnosis not present

## 2023-01-12 DIAGNOSIS — Z993 Dependence on wheelchair: Secondary | ICD-10-CM | POA: Diagnosis not present

## 2023-01-12 DIAGNOSIS — Z791 Long term (current) use of non-steroidal anti-inflammatories (NSAID): Secondary | ICD-10-CM | POA: Diagnosis not present

## 2023-01-12 DIAGNOSIS — M1990 Unspecified osteoarthritis, unspecified site: Secondary | ICD-10-CM | POA: Diagnosis not present

## 2023-01-12 DIAGNOSIS — C787 Secondary malignant neoplasm of liver and intrahepatic bile duct: Secondary | ICD-10-CM | POA: Diagnosis not present

## 2023-01-12 DIAGNOSIS — D630 Anemia in neoplastic disease: Secondary | ICD-10-CM | POA: Diagnosis not present

## 2023-01-12 DIAGNOSIS — I493 Ventricular premature depolarization: Secondary | ICD-10-CM | POA: Diagnosis not present

## 2023-01-12 DIAGNOSIS — R030 Elevated blood-pressure reading, without diagnosis of hypertension: Secondary | ICD-10-CM | POA: Diagnosis not present

## 2023-01-12 DIAGNOSIS — I10 Essential (primary) hypertension: Secondary | ICD-10-CM | POA: Diagnosis not present

## 2023-01-12 DIAGNOSIS — E43 Unspecified severe protein-calorie malnutrition: Secondary | ICD-10-CM | POA: Diagnosis not present

## 2023-01-12 DIAGNOSIS — M545 Low back pain, unspecified: Secondary | ICD-10-CM | POA: Diagnosis not present

## 2023-01-12 DIAGNOSIS — J439 Emphysema, unspecified: Secondary | ICD-10-CM | POA: Diagnosis not present

## 2023-01-12 DIAGNOSIS — C159 Malignant neoplasm of esophagus, unspecified: Secondary | ICD-10-CM | POA: Diagnosis not present

## 2023-01-12 DIAGNOSIS — Z7902 Long term (current) use of antithrombotics/antiplatelets: Secondary | ICD-10-CM | POA: Diagnosis not present

## 2023-01-12 DIAGNOSIS — J449 Chronic obstructive pulmonary disease, unspecified: Secondary | ICD-10-CM | POA: Diagnosis not present

## 2023-01-12 DIAGNOSIS — Z79899 Other long term (current) drug therapy: Secondary | ICD-10-CM | POA: Diagnosis not present

## 2023-01-12 DIAGNOSIS — M069 Rheumatoid arthritis, unspecified: Secondary | ICD-10-CM | POA: Diagnosis not present

## 2023-01-12 DIAGNOSIS — K219 Gastro-esophageal reflux disease without esophagitis: Secondary | ICD-10-CM | POA: Diagnosis not present

## 2023-01-12 DIAGNOSIS — R918 Other nonspecific abnormal finding of lung field: Secondary | ICD-10-CM | POA: Diagnosis not present

## 2023-01-12 DIAGNOSIS — E785 Hyperlipidemia, unspecified: Secondary | ICD-10-CM | POA: Diagnosis not present

## 2023-01-12 DIAGNOSIS — M5137 Other intervertebral disc degeneration, lumbosacral region: Secondary | ICD-10-CM | POA: Diagnosis not present

## 2023-01-12 DIAGNOSIS — I739 Peripheral vascular disease, unspecified: Secondary | ICD-10-CM | POA: Diagnosis not present

## 2023-01-12 DIAGNOSIS — G931 Anoxic brain damage, not elsewhere classified: Secondary | ICD-10-CM | POA: Diagnosis not present

## 2023-01-12 DIAGNOSIS — F32A Depression, unspecified: Secondary | ICD-10-CM | POA: Diagnosis not present

## 2023-01-12 DIAGNOSIS — G894 Chronic pain syndrome: Secondary | ICD-10-CM | POA: Diagnosis not present

## 2023-01-12 DIAGNOSIS — I2510 Atherosclerotic heart disease of native coronary artery without angina pectoris: Secondary | ICD-10-CM | POA: Diagnosis not present

## 2023-01-12 NOTE — Telephone Encounter (Signed)
Scheduled appointment per referral. Patient is aware of the made appointment.

## 2023-01-15 DIAGNOSIS — E785 Hyperlipidemia, unspecified: Secondary | ICD-10-CM | POA: Diagnosis not present

## 2023-01-15 DIAGNOSIS — I252 Old myocardial infarction: Secondary | ICD-10-CM | POA: Diagnosis not present

## 2023-01-15 DIAGNOSIS — K219 Gastro-esophageal reflux disease without esophagitis: Secondary | ICD-10-CM | POA: Diagnosis not present

## 2023-01-15 DIAGNOSIS — I2510 Atherosclerotic heart disease of native coronary artery without angina pectoris: Secondary | ICD-10-CM | POA: Diagnosis not present

## 2023-01-15 DIAGNOSIS — I739 Peripheral vascular disease, unspecified: Secondary | ICD-10-CM | POA: Diagnosis not present

## 2023-01-15 DIAGNOSIS — D630 Anemia in neoplastic disease: Secondary | ICD-10-CM | POA: Diagnosis not present

## 2023-01-15 DIAGNOSIS — Z993 Dependence on wheelchair: Secondary | ICD-10-CM | POA: Diagnosis not present

## 2023-01-15 DIAGNOSIS — G894 Chronic pain syndrome: Secondary | ICD-10-CM | POA: Diagnosis not present

## 2023-01-15 DIAGNOSIS — R918 Other nonspecific abnormal finding of lung field: Secondary | ICD-10-CM | POA: Diagnosis not present

## 2023-01-15 DIAGNOSIS — E43 Unspecified severe protein-calorie malnutrition: Secondary | ICD-10-CM | POA: Diagnosis not present

## 2023-01-15 DIAGNOSIS — M1990 Unspecified osteoarthritis, unspecified site: Secondary | ICD-10-CM | POA: Diagnosis not present

## 2023-01-15 DIAGNOSIS — G931 Anoxic brain damage, not elsewhere classified: Secondary | ICD-10-CM | POA: Diagnosis not present

## 2023-01-15 DIAGNOSIS — J439 Emphysema, unspecified: Secondary | ICD-10-CM | POA: Diagnosis not present

## 2023-01-15 DIAGNOSIS — I493 Ventricular premature depolarization: Secondary | ICD-10-CM | POA: Diagnosis not present

## 2023-01-15 DIAGNOSIS — Z8673 Personal history of transient ischemic attack (TIA), and cerebral infarction without residual deficits: Secondary | ICD-10-CM | POA: Diagnosis not present

## 2023-01-15 DIAGNOSIS — C159 Malignant neoplasm of esophagus, unspecified: Secondary | ICD-10-CM | POA: Diagnosis not present

## 2023-01-15 DIAGNOSIS — C787 Secondary malignant neoplasm of liver and intrahepatic bile duct: Secondary | ICD-10-CM | POA: Diagnosis not present

## 2023-01-15 DIAGNOSIS — M5137 Other intervertebral disc degeneration, lumbosacral region: Secondary | ICD-10-CM | POA: Diagnosis not present

## 2023-01-15 DIAGNOSIS — F32A Depression, unspecified: Secondary | ICD-10-CM | POA: Diagnosis not present

## 2023-01-15 DIAGNOSIS — I10 Essential (primary) hypertension: Secondary | ICD-10-CM | POA: Diagnosis not present

## 2023-01-15 DIAGNOSIS — J449 Chronic obstructive pulmonary disease, unspecified: Secondary | ICD-10-CM | POA: Diagnosis not present

## 2023-01-15 DIAGNOSIS — Z791 Long term (current) use of non-steroidal anti-inflammatories (NSAID): Secondary | ICD-10-CM | POA: Diagnosis not present

## 2023-01-15 DIAGNOSIS — M069 Rheumatoid arthritis, unspecified: Secondary | ICD-10-CM | POA: Diagnosis not present

## 2023-01-15 DIAGNOSIS — Z7902 Long term (current) use of antithrombotics/antiplatelets: Secondary | ICD-10-CM | POA: Diagnosis not present

## 2023-01-16 DIAGNOSIS — F32A Depression, unspecified: Secondary | ICD-10-CM | POA: Diagnosis not present

## 2023-01-16 DIAGNOSIS — J449 Chronic obstructive pulmonary disease, unspecified: Secondary | ICD-10-CM | POA: Diagnosis not present

## 2023-01-16 DIAGNOSIS — D630 Anemia in neoplastic disease: Secondary | ICD-10-CM | POA: Diagnosis not present

## 2023-01-16 DIAGNOSIS — C787 Secondary malignant neoplasm of liver and intrahepatic bile duct: Secondary | ICD-10-CM | POA: Diagnosis not present

## 2023-01-16 DIAGNOSIS — I493 Ventricular premature depolarization: Secondary | ICD-10-CM | POA: Diagnosis not present

## 2023-01-16 DIAGNOSIS — M5137 Other intervertebral disc degeneration, lumbosacral region: Secondary | ICD-10-CM | POA: Diagnosis not present

## 2023-01-16 DIAGNOSIS — Z791 Long term (current) use of non-steroidal anti-inflammatories (NSAID): Secondary | ICD-10-CM | POA: Diagnosis not present

## 2023-01-16 DIAGNOSIS — I10 Essential (primary) hypertension: Secondary | ICD-10-CM | POA: Diagnosis not present

## 2023-01-16 DIAGNOSIS — C159 Malignant neoplasm of esophagus, unspecified: Secondary | ICD-10-CM | POA: Diagnosis not present

## 2023-01-16 DIAGNOSIS — I2510 Atherosclerotic heart disease of native coronary artery without angina pectoris: Secondary | ICD-10-CM | POA: Diagnosis not present

## 2023-01-16 DIAGNOSIS — E785 Hyperlipidemia, unspecified: Secondary | ICD-10-CM | POA: Diagnosis not present

## 2023-01-16 DIAGNOSIS — M069 Rheumatoid arthritis, unspecified: Secondary | ICD-10-CM | POA: Diagnosis not present

## 2023-01-16 DIAGNOSIS — G931 Anoxic brain damage, not elsewhere classified: Secondary | ICD-10-CM | POA: Diagnosis not present

## 2023-01-16 DIAGNOSIS — K219 Gastro-esophageal reflux disease without esophagitis: Secondary | ICD-10-CM | POA: Diagnosis not present

## 2023-01-16 DIAGNOSIS — I252 Old myocardial infarction: Secondary | ICD-10-CM | POA: Diagnosis not present

## 2023-01-16 DIAGNOSIS — J439 Emphysema, unspecified: Secondary | ICD-10-CM | POA: Diagnosis not present

## 2023-01-16 DIAGNOSIS — Z79899 Other long term (current) drug therapy: Secondary | ICD-10-CM | POA: Diagnosis not present

## 2023-01-16 DIAGNOSIS — R918 Other nonspecific abnormal finding of lung field: Secondary | ICD-10-CM | POA: Diagnosis not present

## 2023-01-16 DIAGNOSIS — Z993 Dependence on wheelchair: Secondary | ICD-10-CM | POA: Diagnosis not present

## 2023-01-16 DIAGNOSIS — M1990 Unspecified osteoarthritis, unspecified site: Secondary | ICD-10-CM | POA: Diagnosis not present

## 2023-01-16 DIAGNOSIS — Z8673 Personal history of transient ischemic attack (TIA), and cerebral infarction without residual deficits: Secondary | ICD-10-CM | POA: Diagnosis not present

## 2023-01-16 DIAGNOSIS — I739 Peripheral vascular disease, unspecified: Secondary | ICD-10-CM | POA: Diagnosis not present

## 2023-01-16 DIAGNOSIS — Z7902 Long term (current) use of antithrombotics/antiplatelets: Secondary | ICD-10-CM | POA: Diagnosis not present

## 2023-01-16 DIAGNOSIS — G894 Chronic pain syndrome: Secondary | ICD-10-CM | POA: Diagnosis not present

## 2023-01-16 DIAGNOSIS — E43 Unspecified severe protein-calorie malnutrition: Secondary | ICD-10-CM | POA: Diagnosis not present

## 2023-01-19 DIAGNOSIS — Z89612 Acquired absence of left leg above knee: Secondary | ICD-10-CM | POA: Diagnosis not present

## 2023-01-20 ENCOUNTER — Other Ambulatory Visit: Payer: Self-pay | Admitting: Student

## 2023-01-20 DIAGNOSIS — I2699 Other pulmonary embolism without acute cor pulmonale: Secondary | ICD-10-CM

## 2023-01-21 ENCOUNTER — Other Ambulatory Visit: Payer: Self-pay | Admitting: Oncology

## 2023-01-21 ENCOUNTER — Other Ambulatory Visit: Payer: Self-pay

## 2023-01-22 ENCOUNTER — Inpatient Hospital Stay: Payer: 59 | Admitting: Nurse Practitioner

## 2023-01-22 DIAGNOSIS — G894 Chronic pain syndrome: Secondary | ICD-10-CM | POA: Diagnosis not present

## 2023-01-22 DIAGNOSIS — Z7902 Long term (current) use of antithrombotics/antiplatelets: Secondary | ICD-10-CM | POA: Diagnosis not present

## 2023-01-22 DIAGNOSIS — M5137 Other intervertebral disc degeneration, lumbosacral region: Secondary | ICD-10-CM | POA: Diagnosis not present

## 2023-01-22 DIAGNOSIS — E785 Hyperlipidemia, unspecified: Secondary | ICD-10-CM | POA: Diagnosis not present

## 2023-01-22 DIAGNOSIS — Z993 Dependence on wheelchair: Secondary | ICD-10-CM | POA: Diagnosis not present

## 2023-01-22 DIAGNOSIS — I2510 Atherosclerotic heart disease of native coronary artery without angina pectoris: Secondary | ICD-10-CM | POA: Diagnosis not present

## 2023-01-22 DIAGNOSIS — I739 Peripheral vascular disease, unspecified: Secondary | ICD-10-CM | POA: Diagnosis not present

## 2023-01-22 DIAGNOSIS — I493 Ventricular premature depolarization: Secondary | ICD-10-CM | POA: Diagnosis not present

## 2023-01-22 DIAGNOSIS — F32A Depression, unspecified: Secondary | ICD-10-CM | POA: Diagnosis not present

## 2023-01-22 DIAGNOSIS — I10 Essential (primary) hypertension: Secondary | ICD-10-CM | POA: Diagnosis not present

## 2023-01-22 DIAGNOSIS — G931 Anoxic brain damage, not elsewhere classified: Secondary | ICD-10-CM | POA: Diagnosis not present

## 2023-01-22 DIAGNOSIS — C787 Secondary malignant neoplasm of liver and intrahepatic bile duct: Secondary | ICD-10-CM | POA: Diagnosis not present

## 2023-01-22 DIAGNOSIS — E43 Unspecified severe protein-calorie malnutrition: Secondary | ICD-10-CM | POA: Diagnosis not present

## 2023-01-22 DIAGNOSIS — D630 Anemia in neoplastic disease: Secondary | ICD-10-CM | POA: Diagnosis not present

## 2023-01-22 DIAGNOSIS — I252 Old myocardial infarction: Secondary | ICD-10-CM | POA: Diagnosis not present

## 2023-01-22 DIAGNOSIS — M1990 Unspecified osteoarthritis, unspecified site: Secondary | ICD-10-CM | POA: Diagnosis not present

## 2023-01-22 DIAGNOSIS — Z8673 Personal history of transient ischemic attack (TIA), and cerebral infarction without residual deficits: Secondary | ICD-10-CM | POA: Diagnosis not present

## 2023-01-22 DIAGNOSIS — K219 Gastro-esophageal reflux disease without esophagitis: Secondary | ICD-10-CM | POA: Diagnosis not present

## 2023-01-22 DIAGNOSIS — C159 Malignant neoplasm of esophagus, unspecified: Secondary | ICD-10-CM | POA: Diagnosis not present

## 2023-01-22 DIAGNOSIS — J449 Chronic obstructive pulmonary disease, unspecified: Secondary | ICD-10-CM | POA: Diagnosis not present

## 2023-01-22 DIAGNOSIS — M069 Rheumatoid arthritis, unspecified: Secondary | ICD-10-CM | POA: Diagnosis not present

## 2023-01-22 DIAGNOSIS — R918 Other nonspecific abnormal finding of lung field: Secondary | ICD-10-CM | POA: Diagnosis not present

## 2023-01-22 DIAGNOSIS — J439 Emphysema, unspecified: Secondary | ICD-10-CM | POA: Diagnosis not present

## 2023-01-22 DIAGNOSIS — Z791 Long term (current) use of non-steroidal anti-inflammatories (NSAID): Secondary | ICD-10-CM | POA: Diagnosis not present

## 2023-01-22 NOTE — Progress Notes (Deleted)
Palliative Medicine [facility]  [phone] [phone]   Name: [name] [name] Date: 01/22/2023 MRN: [id number]  DOB: 02/17/1961  Patient Care Team: Rocky Morel, DO as PCP - General Ladona Ridgel Doylene Canning, MD as PCP - Electrophysiology (Cardiology) Rollene Rotunda, MD as PCP - Cardiology (Cardiology) Ewing Schlein, MD as Referring Physician (Pain Medicine) Nada Libman, MD as Consulting Physician (Vascular Surgery) Sallye Lat, MD as Consulting Physician (Ophthalmology) Jerrell Mylar, Jacquelyne Balint, RN as Oncology Nurse Navigator Ladene Artist, MD as Consulting Physician (Oncology)    REASON FOR CONSULTATION: [name] [name] is a 62 y.o. 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  Palliative ask to see for symptom management and goals of care.    SOCIAL HISTORY:     reports that he quit smoking about 3 years ago. His smoking use included cigarettes. He started smoking about 23 years ago. He has a 5 pack-year smoking history. He has never used smokeless tobacco. He reports that he does not drink alcohol and does not use drugs.  ADVANCE DIRECTIVES:  MOST form on file  CODE STATUS: Full code  PAST MEDICAL HISTORY: Past Medical History:  Diagnosis Date   Acute respiratory failure (HCC) 01/31/2022   Bilateral shoulder pain 08/20/2012   Bite wound of left hand 09/27/2021   CAD (coronary artery disease)    Occluded nondominent RCA managed medically - August 2023   Cardiac arrest [facility])    OO[facility] VF - August 2023   Community acquired pneumonia 11/07/2011   Critical limb ischemia with history of revascularization of same extremity (HCC) 04/28/2020   DDD (degenerative disc disease), lumbosacral     and grade 2 slip   DVT (deep venous thrombosis) (HCC) 01/31/2022    Esophageal cancer (HCC)    Adenocarcinoma with metastasis   GERD (gastroesophageal reflux disease)    History of anemia    History of COVID-19 05/10/2019   History of critical lower limb ischemia 02/19/2019   Hypertension    Impingement syndrome of shoulder region    Ischemic ulcer of toe of left foot (HCC)    Liver mass    Low back pain without sciatica    Lupus (HCC)    Multiple fractures of ribs, bilateral, due to CPR trauma    Onychomycosis    Paronychia of great toe    Peripheral vascular disease (HCC)    Pressure injury of skin    Pulmonary embolus (HCC)    RA (rheumatoid arthritis) (HCC)    Rotator cuff tear 11/04/2014   Sternal fracture    Stroke [facility])     PAST SURGICAL HISTORY:  Past Surgical History:  Procedure Laterality Date   ABDOMINAL AORTAGRAM  02/20/2019   ABDOMINAL AORTOGRAM W/LOWER EXTREMITY N/A 02/20/2019   Procedure: ABDOMINAL AORTOGRAM W/LOWER EXTREMITY;  Surgeon: Cephus Shelling, MD;  [facility]: [facility] INVASIVE CV LAB;  Service: Cardiovascular;  Laterality: N/A;   ACHILLES TENDON SURGERY Bilateral    AMPUTATION Left 05/11/2020   Procedure: LEFT ABOVE KNEE AMPUTATION;  Surgeon: Chuck Hint, MD;  Location: [facility] OR;  Service: Vascular;  Laterality: Left;   ENDARTERECTOMY POPLITEAL Left 05/01/2020   Procedure: ENDARTERECTOMY POPLITEAL;  Surgeon: Maeola Harman, MD;  Location: [facility] OR;  Service: Vascular;  Laterality: Left;   FASCIOTOMY Left 05/01/2020   Procedure: FASCIOTOMY;  Surgeon: Randie Heinz,  Dennard Schaumann, MD;  Location: [facility] OR;  Service: Vascular;  Laterality: Left;   FEMORAL-POPLITEAL BYPASS GRAFT Left 02/26/2019   Procedure: BYPASS GRAFT FEMORAL-POPLITEAL ARTERY LEFT LEG USING 6mm PROPATEN GRAFT;  Surgeon: Nada Libman, MD;  Location: [facility] OR;  Service: Vascular;  Laterality: Left;   INTRAOPERATIVE ARTERIOGRAM Left 01/22/2020   Procedure: INTRA OPERATIVE ARTERIOGRAM with administration of thrombolyics in Left femoral to popliteal  bypass.;  Surgeon: Cephus Shelling, MD;  Location: [facility] OR;  Service: Vascular;  Laterality: Left;   IR IMAGING GUIDED PORT INSERTION  03/07/2022   LEFT HEART CATH AND CORONARY ANGIOGRAPHY N/A 03/15/2017   Procedure: LEFT HEART CATH AND CORONARY ANGIOGRAPHY;  Surgeon: Yvonne Kendall, MD;  Location: [facility];  Service: Cardiovascular;  Laterality: N/A;   LEFT HEART CATH AND CORONARY ANGIOGRAPHY N/A 02/06/2022   Procedure: LEFT HEART CATH AND CORONARY ANGIOGRAPHY;  Surgeon: Swaz[place], Peter M, MD;  Location: [facility] INVASIVE CV LAB;  Service: Cardiovascular;  Laterality: N/A;   LOWER EXTREMITY INTERVENTION Left 04/29/2020   Procedure: LOWER EXTREMITY INTERVENTION- LYSIS;  Surgeon: Cephus Shelling, MD;  [facility]: [facility];  Service: Cardiovascular;  Laterality: Left;   OSTEOTOMY AND ULNAR SHORTENING Right 07/22/2002   PATCH ANGIOPLASTY Left 05/01/2020   Procedure: PATCH ANGIOPLASTY USING HEMASHIELD PLATINUM FINESSE PATCH;  Surgeon: Maeola Harman, MD;  Location: [facility] OR;  Service: Vascular;  Laterality: Left;   PERIPHERAL VASCULAR ATHERECTOMY  01/23/2020   Procedure: PERIPHERAL VASCULAR ATHERECTOMY;  Surgeon: Cephus Shelling, MD;  [facility]: [facility];  Service: Cardiovascular;;   PERIPHERAL VASCULAR BALLOON ANGIOPLASTY  01/23/2020   Procedure: PERIPHERAL VASCULAR BALLOON ANGIOPLASTY;  Surgeon: Cephus Shelling, MD;  Location: [facility];  Service: Cardiovascular;;   PERIPHERAL VASCULAR INTERVENTION Left 04/30/2020   Procedure: PERIPHERAL VASCULAR INTERVENTION;  Surgeon: Leonie Douglas, MD;  [facility];  Service: Cardiovascular;  Laterality: Left;   PERIPHERAL VASCULAR THROMBECTOMY N/A 01/23/2020   Procedure: lysis recheck;  Surgeon: Cephus Shelling, MD;  [facility];  Service: Cardiovascular;  Laterality: N/A;  + [place]umb[place]    PERIPHERAL VASCULAR THROMBECTOMY N/A 04/30/2020   Procedure: Lysis Recheck;  Surgeon:  Leonie Douglas, MD;  [facility]: [facility];  Service: Cardiovascular;  Laterality: N/A;   SHOULDER ARTHROSCOPY WITH BICEPSTENOTOMY Right 03/11/2015   Procedure: SHOULDER ARTHROSCOPY WITH BICEPSTENOTOMY;  Surgeon: Loreta Ave, MD;  Location: [facility];  Service: Orthopedics;  Laterality: Right;   SHOULDER ARTHROSCOPY WITH DISTAL CLAVICLE RESECTION Left 11/19/2014   Procedure: SHOULDER ARTHROSCOPY WITH DISTAL CLAVICLE RESECTION;  Surgeon: Mckinley Jewel, MD;  Location: [facility];  Service: Orthopedics;  Laterality: Left;   SHOULDER ARTHROSCOPY WITH ROTATOR CUFF REPAIR AND SUBACROMIAL DECOMPRESSION Left 11/19/2014   Procedure: LEFT SHOULDER ARTHROSCOPY, DEBRIDEMENT DISTAL CLAVICLE EXCISION, ACROMIOPLASTY WITH ROTATOR CUFF REPAIR ;  Surgeon: Mckinley Jewel, MD;  Location: [facility];  Service: Orthopedics;  Laterality: Left;   THROMBECTOMY OF BYPASS GRAFT FEMORAL- POPLITEAL ARTERY Left 05/01/2020   Procedure: THROMBECTOMY OF LOWER EXTREMITY;  Surgeon: Maeola Harman, MD;  Location: [facility] OR;  Service: Vascular;  Laterality: Left;   TRANSFORAMINAL LUMBAR INTERBODY FUSION (TLIF) WITH PEDICLE SCREW FIXATION 1 LEVEL N/A 01/03/2018   Procedure: TRANSFORAMINAL LUMBAR INTERBODY FUSION (TLIF) LUMBAR FIVE-SACRAL ONE;  Surgeon: Venita Lick, MD;  Location: [facility] OR;  Service: Orthopedics;  Laterality: N/A;   ULTRASOUND GUIDANCE FOR VASCULAR ACCESS Right 01/22/2020   Procedure: ULTRASOUND GUIDANCE  FOR VASCULAR ACCESS Right Common Femoral Artery.;  Surgeon: Cephus Shelling, MD;  Location: [facility] OR;  Service: Vascular;  Laterality: Right;   VIDEO ASSISTED THORACOSCOPY (VATS)/DECORTICATION Right 11/21/2011   drainage of empyema    HEMATOLOGY/ONCOLOGY HISTORY:  Oncology History  Adenocarcinoma of unknown origin (HCC)  02/22/2022 Initial Diagnosis   Adenocarcinoma of unknown origin (HCC)   03/15/2022 - 03/15/2022 Chemotherapy   Patient is on Treatment Plan  : COLORECTAL FOLFOX q14d x 3 months     04/06/2022 - 08/16/2022 Chemotherapy   Patient is on Treatment Plan : GASTROESOPHAGEAL FOLFOX D1,15 + Trastuzumab (6/4) D1,15 + Pembrolizumab (200) every 3 weeks, q28d     09/20/2022 -  Chemotherapy   Patient is on Treatment Plan : GASTROESOPHAGEAL Ramucirumab D1, 15 + Paclitaxel D1,8,15 q28d     Esophageal cancer, stage IV (HCC)  03/22/2022 Initial Diagnosis   Cancer of esophagus (HCC)   03/22/2022 Cancer Staging   Staging form: Esophagus - Adenocarcinoma, AJCC 8th Edition - Clinical: Stage IVB (cTX, cNX, pM1) - Signed by Ladene Artist, MD on 03/22/2022   04/06/2022 - 08/16/2022 Chemotherapy   Patient is on Treatment Plan : GASTROESOPHAGEAL FOLFOX D1,15 + Trastuzumab (6/4) D1,15 + Pembrolizumab (200) every 3 weeks, q28d     09/20/2022 -  Chemotherapy   Patient is on Treatment Plan : GASTROESOPHAGEAL Ramucirumab D1, 15 + Paclitaxel D1,8,15 q28d       ALLERGIES:  is allergic to dilaudid [hydromorphone hcl], ramipril, and tramadol.  MEDICATIONS:  Current Outpatient Medications  Medication Sig Dispense Refill   albuterol (VENTOLIN HFA) 108 (90 Base) MCG/ACT inhaler Inhale 2 puffs into the lungs every 6 (six) hours as needed for wheezing or shortness of breath. 6.7 g 2   amiodarone (PACERONE) 200 MG tablet Take 1 tablet (200 mg total) by mouth 2 (two) times daily. 60 tablet 11   amLODipine (NORVASC) 10 MG tablet Take 1 tablet (10 mg total) by mouth daily. 30 tablet 0   apixaban (ELIQUIS) 5 MG TABS tablet Take 1 tablet (5 mg total) by mouth 2 (two) times daily. 60 tablet 0   atorvastatin (LIPITOR) 40 MG tablet Take 1 tablet (40 mg total) by mouth daily at 6 PM. 30 tablet 0   clopidogrel (PLAVIX) 75 MG tablet Take 75 mg by mouth daily.     diclofenac Sodium (VOLTAREN) 1 % GEL Apply 2 g topically 4 (four) times daily. 200 g 0   DULoxetine (CYMBALTA) 30 MG capsule Take 30 mg by mouth daily as needed (sleep).     feeding supplement (ENSURE ENLIVE /  ENSURE PLUS) LIQD Take 237 mLs by mouth 3 (three) times daily between meals. 237 mL 12   ferrous sulfate 325 (65 FE) MG tablet Take 1 tablet (325 mg total) by mouth daily with breakfast for 100 doses. 100 tablet 0   fluticasone-salmeterol (ADVAIR HFA) 45-21 MCG/ACT inhaler Inhale 2 puffs into the lungs 2 (two) times daily. 1 each 12   folic acid (FOLVITE) 1 MG tablet Take 1 mg by mouth daily.     gabapentin (NEURONTIN) 300 MG capsule Take 1 capsule (300 mg total) by mouth 2 (two) times daily. Daily or twice daily if tolerated. 60 capsule 3   isosorbide mononitrate (IMDUR) 30 MG 24 hr tablet Take 1 tablet (30 mg total) by mouth daily. 90 tablet 3   lidocaine-prilocaine (EMLA) cream Apply 1 Application topically as directed. Apply to port site 2 hours prior to stick and cover with Press-and-Seal to numb port  before access 30 g 1   loperamide (IMODIUM A-D) 2 MG tablet Take 2 mg by mouth 4 (four) times daily as needed for diarrhea or loose stools.     meloxicam (MOBIC) 7.5 MG tablet Take 1 tablet (7.5 mg total) by mouth daily as needed for pain. 30 tablet 3   methotrexate (RHEUMATREX) 2.5 MG tablet Take 3 tablets (7.5 mg total) by mouth once a week. Caution:Chemotherapy. Protect from light. 4 tablet 0   ondansetron (ZOFRAN) 8 MG tablet START TAKING 72 HOURS AFTER CHEMOTHERAPY TREATMENT. 30 tablet 1   Oxycodone HCl 10 MG TABS Take 10 mg by mouth every 6 (six) hours.     potassium chloride (KLOR-CON M10) 10 MEQ tablet TAKE 2 TABLETS(20 MEQ) BY MOUTH DAILY (Patient taking differently: Take 10 mEq by mouth 2 (two) times daily.) 180 tablet 0   prochlorperazine (COMPAZINE) 10 MG tablet TAKE 1 TABLET (10 MG TOTAL) BY MOUTH EVERY 6 (SIX) HOURS AS NEEDED. (Patient taking differently: Take 10 mg by mouth every 6 (six) hours as needed (for Cancer Chemotherapy-Induced Nausea and Vomiting).) 60 tablet 1   No current facility-administered medications for this visit.    VITAL SIGNS: There were no vitals taken for  this visit. There were no vitals filed for this visit.  Estimated body mass index is 20.51 kg/m as calculated from the following:   Height as of 12/20/22: 5\' 8"  (1.727 m).   Weight as of 01/08/23: 134 lb 14.4 oz (61.2 kg).  LABS: CBC:    Component Value Date/Time   WBC 7.1 01/08/2023 0942   WBC 5.2 01/01/2023 0500   HGB 10.0 (L) 01/08/2023 0942   HGB 11.4 (L) 06/15/2022 1458   HCT 32.0 (L) 01/08/2023 0942   HCT 36.1 (L) 06/15/2022 1458   PLT 130 (L) 01/08/2023 0942   PLT 133 (L) 06/15/2022 1458   MCV 89.1 01/08/2023 0942   MCV 84 06/15/2022 1458   NEUTROABS 5.1 01/08/2023 0942   NEUTROABS 2.3 06/15/2022 1458   LYMPHSABS 1.1 01/08/2023 0942   LYMPHSABS 1.1 06/15/2022 1458   MONOABS 0.4 01/08/2023 0942   EOSABS 0.4 01/08/2023 0942   EOSABS 0.2 06/15/2022 1458   BASOSABS 0.1 01/08/2023 0942   BASOSABS 0.1 06/15/2022 1458   Comprehensive Metabolic Panel:    Component Value Date/Time   NA 138 01/08/2023 0942   NA 135 08/16/2022 1524   K 4.5 01/08/2023 0942   CL 107 01/08/2023 0942   CO2 25 01/08/2023 0942   BUN 16 01/08/2023 0942   BUN 21 08/16/2022 1524   CREATININE 0.92 01/08/2023 0942   CREATININE 0.80 11/27/2012 0956   GLUCOSE 80 01/08/2023 0942   CALCIUM 8.5 (L) 01/08/2023 0942   AST 26 01/08/2023 0942   ALT 15 01/08/2023 0942   ALKPHOS 161 (H) 01/08/2023 0942   BILITOT 0.4 01/08/2023 0942   PROT 6.1 (L) 01/08/2023 0942   PROT 6.9 06/15/2022 1458   ALBUMIN 2.9 (L) 01/08/2023 0942   ALBUMIN 3.3 (L) 06/15/2022 1458    RADIOGRAPHIC STUDIES: DG ESOPHAGUS W SINGLE CM (SOL OR THIN BA)  Result Date: 01/01/2023 CLINICAL DATA:  62 year old male with history of esophageal cancer status post chemotherapy presents with 1 time episode of vomiting after eating as well as pneumonia concerning for aspiration. EXAM: ESOPHAGUS/BARIUM SWALLOW/TABLET STUDY TECHNIQUE: Single contrast examination was performed using thin liquid barium. This exam was performed by Lawernce Ion, PA-C, and  was supervised and interpreted by Acquanetta Belling, MD. The exam was extremely limited due to  patient's limited mobility. FLUOROSCOPY: Radiation Exposure Index (as provided by the fluoroscopic device): 17.5 mGy Kerma COMPARISON:  None Available. FINDINGS: Swallowing: Study was limited due to patient's limited mobility, within limitation, no aspiration or penetration seen with thin barium. Pharynx: Unremarkable. Esophagus: Fluoroscopic evaluation demonstrates normal caliber and smooth contour of the esophagus. No evidence of fixed stricture, mass or mucosal abnormality. Esophageal motility: Decreased. Hiatal Hernia: None. Gastroesophageal reflux: None visualized. Gastroesophageal reflux was not reproduced by water siphon, Valsalva, or cough. Ingested 13mm barium tablet: Patient was not able to swallow the 13 mm barium tablet. Other: None. IMPRESSION: 1. Limited study due to patient's limited mobility, within limitation no vestibular penetration or aspiration seen. No true lateral view could be obtained. If there is continued concern for aspiration, dedicated swallow study with speech pathologist should be performed. 2.  Decreased esophageal motility. Electronically Signed   By: Acquanetta Belling M.D.   On: 01/01/2023 12:53    PERFORMANCE STATUS (ECOG) : {CHL ONC ECOG ZO:[id number]}  Review of Systems Unless otherwise noted, a complete review of systems is negative.  Physical Exam General: NAD Cardiovascular: regular rate and rhythm Pulmonary: clear ant fields Abdomen: soft, nontender, + bowel sounds Extremities: no edema, no joint deformities Skin: no rashes Neurological:  IMPRESSION: *** I introduced myself,  RN, and Palliative's role in collaboration with the oncology team. Concept of Palliative Care was introduced as specialized medical care for people and their families living with serious illness.  It focuses on providing relief from the symptoms and stress of a serious illness.  The goal is to  improve quality of life for both the patient and the family. Values and goals of care important to patient and family were attempted to be elicited.    We discussed *** current illness and what it means in the larger context of *** on-going co-morbidities. Natural disease trajectory and expectations were discussed.  I discussed the importance of continued conversation with family and their medical providers regarding overall plan of care and treatment options, ensuring decisions are within the context of the patients values and GOCs.  PLAN: Established therapeutic relationship. Education provided on palliative's role in collaboration with their Oncology/Radiation team. I will plan to see patient back in 2-4 weeks in collaboration to other oncology appointments.    Patient expressed understanding and was in agreement with this plan. He also understands that He can call the clinic at any time with any questions, concerns, or complaints.   Thank you for your referral and allowing Palliative to assist in [name] [id number] care.   Number and complexity of problems addressed: ***HIGH - 1 or more chronic illnesses with SEVERE exacerbation, progression, or side effects of treatment - advanced cancer, pain. Any controlled substances utilized were prescribed in the context of palliative care.   Visit consisted of counseling and education dealing with the complex and emotionally intense issues of symptom management and palliative care in the setting of serious and potentially life-threatening illness.Greater than 50%  of this time was spent counseling and coordinating care related to the above assessment and plan.  Signed by: Willette Alma, AGPCNP-BC Palliative Medicine Team/[facility]   *Please note that this is a verbal dictation therefore any spelling or grammatical errors are due to the "[employer] One" system interpretation.

## 2023-01-23 DIAGNOSIS — I252 Old myocardial infarction: Secondary | ICD-10-CM | POA: Diagnosis not present

## 2023-01-23 DIAGNOSIS — M1990 Unspecified osteoarthritis, unspecified site: Secondary | ICD-10-CM | POA: Diagnosis not present

## 2023-01-23 DIAGNOSIS — I739 Peripheral vascular disease, unspecified: Secondary | ICD-10-CM | POA: Diagnosis not present

## 2023-01-23 DIAGNOSIS — G931 Anoxic brain damage, not elsewhere classified: Secondary | ICD-10-CM | POA: Diagnosis not present

## 2023-01-23 DIAGNOSIS — M5137 Other intervertebral disc degeneration, lumbosacral region: Secondary | ICD-10-CM | POA: Diagnosis not present

## 2023-01-23 DIAGNOSIS — E785 Hyperlipidemia, unspecified: Secondary | ICD-10-CM | POA: Diagnosis not present

## 2023-01-23 DIAGNOSIS — G894 Chronic pain syndrome: Secondary | ICD-10-CM | POA: Diagnosis not present

## 2023-01-23 DIAGNOSIS — I2510 Atherosclerotic heart disease of native coronary artery without angina pectoris: Secondary | ICD-10-CM | POA: Diagnosis not present

## 2023-01-23 DIAGNOSIS — M069 Rheumatoid arthritis, unspecified: Secondary | ICD-10-CM | POA: Diagnosis not present

## 2023-01-23 DIAGNOSIS — Z7902 Long term (current) use of antithrombotics/antiplatelets: Secondary | ICD-10-CM | POA: Diagnosis not present

## 2023-01-23 DIAGNOSIS — I493 Ventricular premature depolarization: Secondary | ICD-10-CM | POA: Diagnosis not present

## 2023-01-23 DIAGNOSIS — J439 Emphysema, unspecified: Secondary | ICD-10-CM | POA: Diagnosis not present

## 2023-01-23 DIAGNOSIS — F32A Depression, unspecified: Secondary | ICD-10-CM | POA: Diagnosis not present

## 2023-01-23 DIAGNOSIS — I10 Essential (primary) hypertension: Secondary | ICD-10-CM | POA: Diagnosis not present

## 2023-01-23 DIAGNOSIS — K219 Gastro-esophageal reflux disease without esophagitis: Secondary | ICD-10-CM | POA: Diagnosis not present

## 2023-01-23 DIAGNOSIS — E43 Unspecified severe protein-calorie malnutrition: Secondary | ICD-10-CM | POA: Diagnosis not present

## 2023-01-23 DIAGNOSIS — C787 Secondary malignant neoplasm of liver and intrahepatic bile duct: Secondary | ICD-10-CM | POA: Diagnosis not present

## 2023-01-23 DIAGNOSIS — R918 Other nonspecific abnormal finding of lung field: Secondary | ICD-10-CM | POA: Diagnosis not present

## 2023-01-23 DIAGNOSIS — Z8673 Personal history of transient ischemic attack (TIA), and cerebral infarction without residual deficits: Secondary | ICD-10-CM | POA: Diagnosis not present

## 2023-01-23 DIAGNOSIS — J449 Chronic obstructive pulmonary disease, unspecified: Secondary | ICD-10-CM | POA: Diagnosis not present

## 2023-01-23 DIAGNOSIS — C159 Malignant neoplasm of esophagus, unspecified: Secondary | ICD-10-CM | POA: Diagnosis not present

## 2023-01-23 DIAGNOSIS — Z993 Dependence on wheelchair: Secondary | ICD-10-CM | POA: Diagnosis not present

## 2023-01-23 DIAGNOSIS — Z791 Long term (current) use of non-steroidal anti-inflammatories (NSAID): Secondary | ICD-10-CM | POA: Diagnosis not present

## 2023-01-23 DIAGNOSIS — D630 Anemia in neoplastic disease: Secondary | ICD-10-CM | POA: Diagnosis not present

## 2023-01-24 MED FILL — Dexamethasone Sodium Phosphate Inj 100 MG/10ML: INTRAMUSCULAR | Qty: 1 | Status: AC

## 2023-01-25 ENCOUNTER — Inpatient Hospital Stay: Payer: 59 | Admitting: Oncology

## 2023-01-25 ENCOUNTER — Inpatient Hospital Stay: Payer: 59

## 2023-01-25 ENCOUNTER — Telehealth: Payer: Self-pay | Admitting: *Deleted

## 2023-01-25 ENCOUNTER — Inpatient Hospital Stay: Payer: 59 | Attending: Oncology

## 2023-01-25 NOTE — Telephone Encounter (Signed)
[name] [name] was "no show" for appointments today. Called him and he reports his ride fell through and he can't locate another ride. Informed him we will just reschedule him. Scheduler notified.

## 2023-01-26 ENCOUNTER — Telehealth: Payer: Self-pay | Admitting: Nurse Practitioner

## 2023-01-26 NOTE — Telephone Encounter (Signed)
Patient is aware of upcoming appointment times/dates.  

## 2023-01-27 ENCOUNTER — Other Ambulatory Visit: Payer: Self-pay

## 2023-01-27 ENCOUNTER — Other Ambulatory Visit: Payer: Self-pay | Admitting: Oncology

## 2023-01-29 ENCOUNTER — Encounter: Payer: Self-pay | Admitting: Oncology

## 2023-01-29 DIAGNOSIS — C159 Malignant neoplasm of esophagus, unspecified: Secondary | ICD-10-CM | POA: Diagnosis not present

## 2023-01-29 DIAGNOSIS — C787 Secondary malignant neoplasm of liver and intrahepatic bile duct: Secondary | ICD-10-CM | POA: Diagnosis not present

## 2023-01-29 DIAGNOSIS — M5137 Other intervertebral disc degeneration, lumbosacral region: Secondary | ICD-10-CM | POA: Diagnosis not present

## 2023-01-29 DIAGNOSIS — I739 Peripheral vascular disease, unspecified: Secondary | ICD-10-CM | POA: Diagnosis not present

## 2023-01-29 DIAGNOSIS — I2510 Atherosclerotic heart disease of native coronary artery without angina pectoris: Secondary | ICD-10-CM | POA: Diagnosis not present

## 2023-01-29 DIAGNOSIS — R918 Other nonspecific abnormal finding of lung field: Secondary | ICD-10-CM | POA: Diagnosis not present

## 2023-01-29 DIAGNOSIS — M069 Rheumatoid arthritis, unspecified: Secondary | ICD-10-CM | POA: Diagnosis not present

## 2023-01-29 DIAGNOSIS — E43 Unspecified severe protein-calorie malnutrition: Secondary | ICD-10-CM | POA: Diagnosis not present

## 2023-01-29 DIAGNOSIS — G894 Chronic pain syndrome: Secondary | ICD-10-CM | POA: Diagnosis not present

## 2023-01-29 DIAGNOSIS — I252 Old myocardial infarction: Secondary | ICD-10-CM | POA: Diagnosis not present

## 2023-01-29 DIAGNOSIS — K219 Gastro-esophageal reflux disease without esophagitis: Secondary | ICD-10-CM | POA: Diagnosis not present

## 2023-01-29 DIAGNOSIS — Z791 Long term (current) use of non-steroidal anti-inflammatories (NSAID): Secondary | ICD-10-CM | POA: Diagnosis not present

## 2023-01-29 DIAGNOSIS — J439 Emphysema, unspecified: Secondary | ICD-10-CM | POA: Diagnosis not present

## 2023-01-29 DIAGNOSIS — I493 Ventricular premature depolarization: Secondary | ICD-10-CM | POA: Diagnosis not present

## 2023-01-29 DIAGNOSIS — G931 Anoxic brain damage, not elsewhere classified: Secondary | ICD-10-CM | POA: Diagnosis not present

## 2023-01-29 DIAGNOSIS — M1990 Unspecified osteoarthritis, unspecified site: Secondary | ICD-10-CM | POA: Diagnosis not present

## 2023-01-29 DIAGNOSIS — Z8673 Personal history of transient ischemic attack (TIA), and cerebral infarction without residual deficits: Secondary | ICD-10-CM | POA: Diagnosis not present

## 2023-01-29 DIAGNOSIS — J449 Chronic obstructive pulmonary disease, unspecified: Secondary | ICD-10-CM | POA: Diagnosis not present

## 2023-01-29 DIAGNOSIS — Z993 Dependence on wheelchair: Secondary | ICD-10-CM | POA: Diagnosis not present

## 2023-01-29 DIAGNOSIS — E785 Hyperlipidemia, unspecified: Secondary | ICD-10-CM | POA: Diagnosis not present

## 2023-01-29 DIAGNOSIS — D630 Anemia in neoplastic disease: Secondary | ICD-10-CM | POA: Diagnosis not present

## 2023-01-29 DIAGNOSIS — Z7902 Long term (current) use of antithrombotics/antiplatelets: Secondary | ICD-10-CM | POA: Diagnosis not present

## 2023-01-29 DIAGNOSIS — I10 Essential (primary) hypertension: Secondary | ICD-10-CM | POA: Diagnosis not present

## 2023-01-30 DIAGNOSIS — J439 Emphysema, unspecified: Secondary | ICD-10-CM | POA: Diagnosis not present

## 2023-01-30 DIAGNOSIS — E785 Hyperlipidemia, unspecified: Secondary | ICD-10-CM | POA: Diagnosis not present

## 2023-01-30 DIAGNOSIS — G894 Chronic pain syndrome: Secondary | ICD-10-CM | POA: Diagnosis not present

## 2023-01-30 DIAGNOSIS — D630 Anemia in neoplastic disease: Secondary | ICD-10-CM | POA: Diagnosis not present

## 2023-01-30 DIAGNOSIS — I2510 Atherosclerotic heart disease of native coronary artery without angina pectoris: Secondary | ICD-10-CM | POA: Diagnosis not present

## 2023-01-30 DIAGNOSIS — G931 Anoxic brain damage, not elsewhere classified: Secondary | ICD-10-CM | POA: Diagnosis not present

## 2023-01-30 DIAGNOSIS — Z993 Dependence on wheelchair: Secondary | ICD-10-CM | POA: Diagnosis not present

## 2023-01-30 DIAGNOSIS — R918 Other nonspecific abnormal finding of lung field: Secondary | ICD-10-CM | POA: Diagnosis not present

## 2023-01-30 DIAGNOSIS — I252 Old myocardial infarction: Secondary | ICD-10-CM | POA: Diagnosis not present

## 2023-01-30 DIAGNOSIS — C787 Secondary malignant neoplasm of liver and intrahepatic bile duct: Secondary | ICD-10-CM | POA: Diagnosis not present

## 2023-01-30 DIAGNOSIS — Z791 Long term (current) use of non-steroidal anti-inflammatories (NSAID): Secondary | ICD-10-CM | POA: Diagnosis not present

## 2023-01-30 DIAGNOSIS — Z7902 Long term (current) use of antithrombotics/antiplatelets: Secondary | ICD-10-CM | POA: Diagnosis not present

## 2023-01-30 DIAGNOSIS — M5137 Other intervertebral disc degeneration, lumbosacral region: Secondary | ICD-10-CM | POA: Diagnosis not present

## 2023-01-30 DIAGNOSIS — C159 Malignant neoplasm of esophagus, unspecified: Secondary | ICD-10-CM | POA: Diagnosis not present

## 2023-01-30 DIAGNOSIS — M069 Rheumatoid arthritis, unspecified: Secondary | ICD-10-CM | POA: Diagnosis not present

## 2023-01-30 DIAGNOSIS — M1990 Unspecified osteoarthritis, unspecified site: Secondary | ICD-10-CM | POA: Diagnosis not present

## 2023-01-30 DIAGNOSIS — I10 Essential (primary) hypertension: Secondary | ICD-10-CM | POA: Diagnosis not present

## 2023-01-30 DIAGNOSIS — E43 Unspecified severe protein-calorie malnutrition: Secondary | ICD-10-CM | POA: Diagnosis not present

## 2023-01-30 DIAGNOSIS — J449 Chronic obstructive pulmonary disease, unspecified: Secondary | ICD-10-CM | POA: Diagnosis not present

## 2023-01-30 DIAGNOSIS — K219 Gastro-esophageal reflux disease without esophagitis: Secondary | ICD-10-CM | POA: Diagnosis not present

## 2023-01-30 DIAGNOSIS — I739 Peripheral vascular disease, unspecified: Secondary | ICD-10-CM | POA: Diagnosis not present

## 2023-01-30 DIAGNOSIS — I493 Ventricular premature depolarization: Secondary | ICD-10-CM | POA: Diagnosis not present

## 2023-01-30 DIAGNOSIS — Z8673 Personal history of transient ischemic attack (TIA), and cerebral infarction without residual deficits: Secondary | ICD-10-CM | POA: Diagnosis not present

## 2023-02-01 ENCOUNTER — Inpatient Hospital Stay: Payer: 59

## 2023-02-02 NOTE — Progress Notes (Deleted)
Cardiology Office Note Date:  02/02/2023  Patient ID:  [name] [name], [name] [name] 03/30/1961, MRN [id number] PCP:  Rocky Morel, DO  Cardiologist:  Dr. Elease Hashimoto Electrophysiologist: Dr. Ladona Ridgel  ***refresh   Chief Complaint: *** 3  mo  History of Present Illness: [name] [name] is a 62 y.o. male with history of CAD (STEMI Aug 2023, medically managed), DVT/PE (on [facility]), metastatic adenocarcinoma, PVD  L AKA, chronic anemia, VT (insetting of a STEMI and again in setting of electrolyte derangement), ICM  He was hospitalized 08/04/22 with VT in setting of prolonged QT, hypokalemia, low mag. Ongoing treatment for his adenocarcinoma with some degree of N/V,  port in place, not felt a candidate for ICD with reversible causes and port.  He has seen A. Tillery a couple times since then, last was 09/28/22, no symptoms of arrhythmia, some DOE.  Maintained on BID amiodarone, planned to consider down titration at his next visit. Missed his gen cards visit.  BP soft, pending new visit with them.  Admitted 12/27/22 with sepsis 2/2 aspiration PNA, AKI did required ICU/pressor initially, discharged 01/01/23  Saw Dr. Eden Emms today ***  *** amio labs *** dose? Reduce? *** lytes 1st? *** QT? *** palps, syncope... *** monitor 1st? *** eliquis, bleeding   AAD hx Amiodarone started Aug 2023 in setting of VT/STEMI discontinued prior to discharge Amiodarone started Feb 2024, in setting of VT/electrolyte imbalance   Past Medical History:  Diagnosis Date   Acute respiratory failure (HCC) 01/31/2022   Bilateral shoulder pain 08/20/2012   Bite wound of left hand 09/27/2021   CAD (coronary artery disease)    Occluded nondominent RCA managed medically - August 2023   Cardiac arrest [facility])    [facility] VF - August 2023   Community acquired pneumonia 11/07/2011   Critical limb ischemia with history of revascularization of same extremity (HCC) 04/28/2020   DDD (degenerative disc disease), lumbosacral     and grade 2 slip    DVT (deep venous thrombosis) (HCC) 01/31/2022   Esophageal cancer (HCC)    Adenocarcinoma with metastasis   GERD (gastroesophageal reflux disease)    History of anemia    History of COVID-19 05/10/2019   History of critical lower limb ischemia 02/19/2019   Hypertension    Impingement syndrome of shoulder region    Ischemic ulcer of toe of left foot (HCC)    Liver mass    Low back pain without sciatica    Lupus (HCC)    Multiple fractures of ribs, bilateral, due to CPR trauma    Onychomycosis    Paronychia of great toe    Peripheral vascular disease (HCC)    Pressure injury of skin    Pulmonary embolus (HCC)    RA (rheumatoid arthritis) (HCC)    Rotator cuff tear 11/04/2014   Sternal fracture    Stroke [facility])     Past Surgical History:  Procedure Laterality Date   ABDOMINAL AORTAGRAM  02/20/2019   ABDOMINAL AORTOGRAM W/LOWER EXTREMITY N/A 02/20/2019   Procedure: ABDOMINAL AORTOGRAM W/LOWER EXTREMITY;  Surgeon: Cephus Shelling, MD;  [facility];  Service: Cardiovascular;  Laterality: N/A;   ACHILLES TENDON SURGERY Bilateral    AMPUTATION Left 05/11/2020   Procedure: LEFT ABOVE KNEE AMPUTATION;  Surgeon: Chuck Hint, MD;  Location: [facility] OR;  Service: Vascular;  Laterality: Left;   ENDARTERECTOMY POPLITEAL Left 05/01/2020   Procedure: ENDARTERECTOMY POPLITEAL;  Surgeon: Maeola Harman, MD;  Location: [facility] OR;  Service: Vascular;  Laterality: Left;   FASCIOTOMY Left 05/01/2020   Procedure: FASCIOTOMY;  Surgeon: Maeola Harman, MD;  Location: [facility] OR;  Service: Vascular;  Laterality: Left;   FEMORAL-POPLITEAL BYPASS GRAFT Left 02/26/2019   Procedure: BYPASS GRAFT FEMORAL-POPLITEAL ARTERY LEFT LEG USING 6mm PROPATEN GRAFT;  Surgeon: Nada Libman, MD;  Location: [facility] OR;  Service: Vascular;  Laterality: Left;   INTRAOPERATIVE ARTERIOGRAM Left 01/22/2020   Procedure: INTRA OPERATIVE ARTERIOGRAM with administration of thrombolyics in Left  femoral to popliteal bypass.;  Surgeon: Cephus Shelling, MD;  Location: [facility] OR;  Service: Vascular;  Laterality: Left;   IR IMAGING GUIDED PORT INSERTION  03/07/2022   LEFT HEART CATH AND CORONARY ANGIOGRAPHY N/A 03/15/2017   Procedure: LEFT HEART CATH AND CORONARY ANGIOGRAPHY;  Surgeon: Yvonne Kendall, MD;  [facility];  Service: Cardiovascular;  Laterality: N/A;   LEFT HEART CATH AND CORONARY ANGIOGRAPHY N/A 02/06/2022   Procedure: LEFT HEART CATH AND CORONARY ANGIOGRAPHY;  Surgeon: Swaziland, Peter M, MD;  Location: [place] INVASIVE CV LAB;  Service: Cardiovascular;  Laterality: N/A;   LOWER EXTREMITY INTERVENTION Left 04/29/2020   Procedure: LOWER EXTREMITY INTERVENTION- LYSIS;  Surgeon: Cephus Shelling, MD;  [facility];  Service: Cardiovascular;  Laterality: Left;   OSTEOTOMY AND ULNAR SHORTENING Right 07/22/2002   PATCH ANGIOPLASTY Left 05/01/2020   Procedure: PATCH ANGIOPLASTY USING HEMASHIELD PLATINUM FINESSE PATCH;  Surgeon: Maeola Harman, MD;  Location: [facility] OR;  Service: Vascular;  Laterality: Left;   PERIPHERAL VASCULAR ATHERECTOMY  01/23/2020   Procedure: PERIPHERAL VASCULAR ATHERECTOMY;  Surgeon: Cephus Shelling, MD;  [facility];  Service: Cardiovascular;;   PERIPHERAL VASCULAR BALLOON ANGIOPLASTY  01/23/2020   Procedure: PERIPHERAL VASCULAR BALLOON ANGIOPLASTY;  Surgeon: Cephus Shelling, MD;  [facility];  Service: Cardiovascular;;   PERIPHERAL VASCULAR INTERVENTION Left 04/30/2020   Procedure: PERIPHERAL VASCULAR INTERVENTION;  Surgeon: Leonie Douglas, MD;  [facility];  Service: Cardiovascular;  Laterality: Left;   PERIPHERAL VASCULAR THROMBECTOMY N/A 01/23/2020   Procedure: lysis recheck;  Surgeon: Cephus Shelling, MD;  [facility];  Service: Cardiovascular;  Laterality: N/A;  + [place]    PERIPHERAL VASCULAR THROMBECTOMY N/A 04/30/2020   Procedure: Lysis  Recheck;  Surgeon: Leonie Douglas, MD;  [facility];  Service: Cardiovascular;  Laterality: N/A;   SHOULDER ARTHROSCOPY WITH BICEPSTENOTOMY Right 03/11/2015   Procedure: SHOULDER ARTHROSCOPY WITH BICEPSTENOTOMY;  Surgeon: Loreta Ave, MD;  Location: [facility];  Service: Orthopedics;  Laterality: Right;   SHOULDER ARTHROSCOPY WITH DISTAL CLAVICLE RESECTION Left 11/19/2014   Procedure: SHOULDER ARTHROSCOPY WITH DISTAL CLAVICLE RESECTION;  Surgeon: Mckinley Jewel, MD;  Location: [facility];  Service: Orthopedics;  Laterality: Left;   SHOULDER ARTHROSCOPY WITH ROTATOR CUFF REPAIR AND SUBACROMIAL DECOMPRESSION Left 11/19/2014   Procedure: LEFT SHOULDER ARTHROSCOPY, DEBRIDEMENT DISTAL CLAVICLE EXCISION, ACROMIOPLASTY WITH ROTATOR CUFF REPAIR ;  Surgeon: Mckinley Jewel, MD;  Location: [facility];  Service: Orthopedics;  Laterality: Left;   THROMBECTOMY OF BYPASS GRAFT FEMORAL- POPLITEAL ARTERY Left 05/01/2020   Procedure: THROMBECTOMY OF LOWER EXTREMITY;  Surgeon: Maeola Harman, MD;  Location: [facility] OR;  Service: Vascular;  Laterality: Left;   TRANSFORAMINAL LUMBAR INTERBODY FUSION (TLIF) WITH PEDICLE SCREW FIXATION 1 LEVEL N/A 01/03/2018   Procedure: TRANSFORAMINAL LUMBAR INTERBODY FUSION (TLIF) LUMBAR FIVE-SACRAL ONE;  Surgeon: Venita Lick, MD;  [facility];  Service: Orthopedics;  Laterality: N/A;  ULTRASOUND GUIDANCE FOR VASCULAR ACCESS Right 01/22/2020   Procedure: ULTRASOUND GUIDANCE FOR VASCULAR ACCESS Right Common Femoral Artery.;  Surgeon: Cephus Shelling, MD;  Location: [facility] OR;  Service: Vascular;  Laterality: Right;   VIDEO ASSISTED THORACOSCOPY (VATS)/DECORTICATION Right 11/21/2011   drainage of empyema    Current Outpatient Medications  Medication Sig Dispense Refill   albuterol (VENTOLIN HFA) 108 (90 Base) MCG/ACT inhaler INHALE 2 PUFFS INTO THE LUNGS EVERY 6 (SIX) HOURS AS NEEDED FOR WHEEZING OR SHORTNESS OF  BREATH. 8.5 g 1   amiodarone (PACERONE) 200 MG tablet Take 1 tablet (200 mg total) by mouth 2 (two) times daily. 60 tablet 11   amLODipine (NORVASC) 10 MG tablet Take 1 tablet (10 mg total) by mouth daily. 30 tablet 0   atorvastatin (LIPITOR) 40 MG tablet Take 1 tablet (40 mg total) by mouth daily at 6 PM. 30 tablet 0   clopidogrel (PLAVIX) 75 MG tablet Take 75 mg by mouth daily.     diclofenac Sodium (VOLTAREN) 1 % GEL Apply 2 g topically 4 (four) times daily. 200 g 0   DULoxetine (CYMBALTA) 30 MG capsule Take 30 mg by mouth daily as needed (sleep).     ELIQUIS 5 MG TABS tablet TAKE 1 TABLET (5 MG TOTAL) BY MOUTH 2 (TWO) TIMES DAILY. 60 tablet 0   feeding supplement (ENSURE ENLIVE / ENSURE PLUS) LIQD Take 237 mLs by mouth 3 (three) times daily between meals. 237 mL 12   ferrous sulfate 325 (65 FE) MG tablet Take 1 tablet (325 mg total) by mouth daily with breakfast for 100 doses. 100 tablet 0   fluticasone-salmeterol (ADVAIR HFA) 45-21 MCG/ACT inhaler Inhale 2 puffs into the lungs 2 (two) times daily. 1 each 12   folic acid (FOLVITE) 1 MG tablet Take 1 mg by mouth daily.     gabapentin (NEURONTIN) 300 MG capsule Take 1 capsule (300 mg total) by mouth 2 (two) times daily. Daily or twice daily if tolerated. 60 capsule 3   isosorbide mononitrate (IMDUR) 30 MG 24 hr tablet Take 1 tablet (30 mg total) by mouth daily. 90 tablet 3   lidocaine-prilocaine (EMLA) cream Apply 1 Application topically as directed. Apply to port site 2 hours prior to stick and cover with Press-and-Seal to numb port before access 30 g 1   loperamide (IMODIUM A-D) 2 MG tablet Take 2 mg by mouth 4 (four) times daily as needed for diarrhea or loose stools.     meloxicam (MOBIC) 7.5 MG tablet Take 1 tablet (7.5 mg total) by mouth daily as needed for pain. 30 tablet 3   methotrexate (RHEUMATREX) 2.5 MG tablet Take 3 tablets (7.5 mg total) by mouth once a week. Caution:Chemotherapy. Protect from light. 4 tablet 0   ondansetron  (ZOFRAN) 8 MG tablet START TAKING 72 HOURS AFTER CHEMOTHERAPY TREATMENT. 30 tablet 1   Oxycodone HCl 10 MG TABS Take 10 mg by mouth every 6 (six) hours.     potassium chloride (KLOR-CON M10) 10 MEQ tablet TAKE 2 TABLETS(20 MEQ) BY MOUTH DAILY (Patient taking differently: Take 10 mEq by mouth 2 (two) times daily.) 180 tablet 0   prochlorperazine (COMPAZINE) 10 MG tablet TAKE 1 TABLET (10 MG TOTAL) BY MOUTH EVERY 6 (SIX) HOURS AS NEEDED. 60 tablet 1   No current facility-administered medications for this visit.    Allergies:   Dilaudid [hydromorphone hcl], Ramipril, and Tramadol   Social History:  The patient  reports that he quit smoking about 3 years ago.  His smoking use included cigarettes. He started smoking about 23 years ago. He has a 5 pack-year smoking history. He has never used smokeless tobacco. He reports that he does not drink alcohol and does not use drugs.   Family History:  The patient's family history includes Alcohol abuse in his father; Aneurysm in his mother; Heart attack (age of onset: 27) in his father.  ROS:  Please see the history of present illness.    All other systems are reviewed and otherwise negative.   PHYSICAL EXAM:  VS:  There were no vitals taken for this visit. BMI: There is no height or weight on file to calculate BMI. Well nourished, well developed, in no acute distress HEENT: normocephalic, atraumatic Neck: no JVD, carotid bruits or masses Cardiac:  *** RRR; no significant murmurs, no rubs, or gallops Lungs:  *** CTA b/l, no wheezing, rhonchi or rales Abd: soft, nontender MS: no deformity or *** atrophy Ext: *** no edema Skin: warm and dry, no rash Neuro:  No gross deficits appreciated Psych: euthymic mood, full affect   EKG:  Done today and reviewed by myself shows       12/27/22: TTE 1. Left ventricular ejection fraction, by estimation, is 50 to 55%. The  left ventricle has low normal function. The left ventricle has no regional  wall motion  abnormalities. There is mild left ventricular hypertrophy.  Left ventricular diastolic  parameters are indeterminate.   2. Right ventricular systolic function is moderately reduced. The right  ventricular size is moderately enlarged. There is mildly elevated  pulmonary artery systolic pressure. The estimated right ventricular  systolic pressure is 40.4 mmHg.   3. Right atrial size was moderately dilated.   4. The mitral valve is normal in structure. No evidence of mitral valve  regurgitation.   5. The aortic valve was not well visualized. Aortic valve regurgitation  is trivial. Aortic valve sclerosis/calcification is present, without any  evidence of aortic stenosis.   6. The inferior vena cava is dilated in size with <50% respiratory  variability, suggesting right atrial pressure of 15 mmHg.    02/06/22: LHC Dist Cx lesion is 30% stenosed.   Prox RCA lesion is 100% stenosed.   LV end diastolic pressure is normal.   Single vessel occlusive CAD. Occlusion of proximal nondominant RCA which is new since 2018.  Normal LVEDP  Recent Labs: 11/16/2022: TSH 4.722 01/01/2023: B Natriuretic Peptide 291.8; Magnesium 1.7 01/08/2023: ALT 15; BUN 16; Creatinine 0.92; Hemoglobin 10.0; Platelet Count 130; Potassium 4.5; Sodium 138  02/28/2022: Cholesterol 94; HDL 26; LDL Cholesterol 31; Total CHOL/HDL Ratio 3.6; Triglycerides 186; VLDL 37   CrCl cannot be calculated (Patient's most recent lab result is older than the maximum 21 days allowed.).   Wt Readings from Last 3 Encounters:  01/08/23 134 lb 14.4 oz (61.2 kg)  01/01/23 136 lb 14.5 oz (62.1 kg)  12/20/22 136 lb (61.7 kg)     Other studies reviewed: Additional studies/records reviewed today include: summarized above  ASSESSMENT AND PLAN:  VT Amiodarone  CAD ***  ICM Recovered LVEF ***  Disposition: F/u with ***  Current medicines are reviewed at length with the patient today.  The patient did not have any concerns regarding  medicines.  Norma Fredrickson, PA-C 02/02/2023 3:21 PM     [facility] [address] [place] [address] [phone] (office)  [phone] (fax)

## 2023-02-03 ENCOUNTER — Other Ambulatory Visit: Payer: Self-pay

## 2023-02-04 ENCOUNTER — Encounter: Payer: Self-pay | Admitting: Cardiovascular Disease

## 2023-02-04 NOTE — Progress Notes (Signed)
  Cardiology Office Note:  .   Date:  02/04/2023  ID:  [name] [name], DOB 11-21-1960, MRN [id number] PCP: Rocky Morel, DO  [facility] Providers Cardiologist:  Rollene Rotunda, MD Electrophysiologist:  Lewayne Bunting, MD { Click to update primary MD,subspecialty MD or APP then REFRESH:1}   History of Present Illness: .   [name] [name] is a 62 y.o. male who is seen for DOD work in  Has seen Dr. Allyson Sabal, [place] earlier this year  Followed by EP Ladona Ridgel, [facility])      Studies Reviewed: .        *** Risk Assessment/Calculations:   {Does this patient have ATRIAL FIBRILLATION?:[phone]} No BP recorded.  {Refresh Note OR Click here to enter BP  :1}***       Physical Exam:   VS:  There were no vitals taken for this visit.   Wt Readings from Last 3 Encounters:  01/08/23 134 lb 14.4 oz (61.2 kg)  01/01/23 136 lb 14.5 oz (62.1 kg)  12/20/22 136 lb (61.7 kg)    GEN: Well nourished, well developed in no acute distress NECK: No JVD; No carotid bruits CARDIAC: ***RRR, no murmurs, rubs, gallops RESPIRATORY:  Clear to auscultation without rales, wheezing or rhonchi  ABDOMEN: Soft, non-tender, non-distended EXTREMITIES:  No edema; No deformity   ASSESSMENT AND PLAN: .   ***    {Are you ordering a CV Procedure (e.g. stress test, cath, DCCV, TEE, etc)?   Press F2        :[id number]}  Dispo: ***  Signed, Kristeen Miss, MD

## 2023-02-05 ENCOUNTER — Telehealth: Payer: Self-pay

## 2023-02-05 ENCOUNTER — Ambulatory Visit: Payer: 59 | Admitting: Cardiovascular Disease

## 2023-02-05 ENCOUNTER — Encounter (HOSPITAL_COMMUNITY): Payer: Self-pay | Admitting: Emergency Medicine

## 2023-02-05 ENCOUNTER — Emergency Department (HOSPITAL_COMMUNITY): Payer: 59

## 2023-02-05 ENCOUNTER — Ambulatory Visit: Payer: 59 | Attending: Student | Admitting: Physician Assistant

## 2023-02-05 ENCOUNTER — Emergency Department (HOSPITAL_COMMUNITY)
Admission: EM | Admit: 2023-02-05 | Discharge: 2023-02-05 | Disposition: A | Discharge status: Home / Self Care | Payer: 59 | Source: Home / Self Care | Attending: Emergency Medicine | Admitting: Emergency Medicine

## 2023-02-05 ENCOUNTER — Other Ambulatory Visit: Payer: Self-pay

## 2023-02-05 DIAGNOSIS — Z7901 Long term (current) use of anticoagulants: Secondary | ICD-10-CM | POA: Diagnosis not present

## 2023-02-05 DIAGNOSIS — M5137 Other intervertebral disc degeneration, lumbosacral region: Secondary | ICD-10-CM | POA: Diagnosis not present

## 2023-02-05 DIAGNOSIS — M1990 Unspecified osteoarthritis, unspecified site: Secondary | ICD-10-CM | POA: Diagnosis not present

## 2023-02-05 DIAGNOSIS — I959 Hypotension, unspecified: Secondary | ICD-10-CM | POA: Diagnosis not present

## 2023-02-05 DIAGNOSIS — E785 Hyperlipidemia, unspecified: Secondary | ICD-10-CM | POA: Diagnosis not present

## 2023-02-05 DIAGNOSIS — Z743 Need for continuous supervision: Secondary | ICD-10-CM | POA: Diagnosis not present

## 2023-02-05 DIAGNOSIS — Z993 Dependence on wheelchair: Secondary | ICD-10-CM | POA: Diagnosis not present

## 2023-02-05 DIAGNOSIS — Z791 Long term (current) use of non-steroidal anti-inflammatories (NSAID): Secondary | ICD-10-CM | POA: Diagnosis not present

## 2023-02-05 DIAGNOSIS — Z7902 Long term (current) use of antithrombotics/antiplatelets: Secondary | ICD-10-CM | POA: Diagnosis not present

## 2023-02-05 DIAGNOSIS — G931 Anoxic brain damage, not elsewhere classified: Secondary | ICD-10-CM | POA: Diagnosis not present

## 2023-02-05 DIAGNOSIS — C159 Malignant neoplasm of esophagus, unspecified: Secondary | ICD-10-CM | POA: Diagnosis not present

## 2023-02-05 DIAGNOSIS — R918 Other nonspecific abnormal finding of lung field: Secondary | ICD-10-CM | POA: Diagnosis not present

## 2023-02-05 DIAGNOSIS — K219 Gastro-esophageal reflux disease without esophagitis: Secondary | ICD-10-CM | POA: Diagnosis not present

## 2023-02-05 DIAGNOSIS — D630 Anemia in neoplastic disease: Secondary | ICD-10-CM | POA: Diagnosis not present

## 2023-02-05 DIAGNOSIS — R600 Localized edema: Secondary | ICD-10-CM | POA: Insufficient documentation

## 2023-02-05 DIAGNOSIS — I739 Peripheral vascular disease, unspecified: Secondary | ICD-10-CM | POA: Diagnosis not present

## 2023-02-05 DIAGNOSIS — Z8673 Personal history of transient ischemic attack (TIA), and cerebral infarction without residual deficits: Secondary | ICD-10-CM | POA: Diagnosis not present

## 2023-02-05 DIAGNOSIS — J439 Emphysema, unspecified: Secondary | ICD-10-CM | POA: Diagnosis not present

## 2023-02-05 DIAGNOSIS — I2510 Atherosclerotic heart disease of native coronary artery without angina pectoris: Secondary | ICD-10-CM | POA: Insufficient documentation

## 2023-02-05 DIAGNOSIS — E43 Unspecified severe protein-calorie malnutrition: Secondary | ICD-10-CM | POA: Diagnosis not present

## 2023-02-05 DIAGNOSIS — R531 Weakness: Secondary | ICD-10-CM | POA: Diagnosis not present

## 2023-02-05 DIAGNOSIS — Z7401 Bed confinement status: Secondary | ICD-10-CM | POA: Diagnosis not present

## 2023-02-05 DIAGNOSIS — I493 Ventricular premature depolarization: Secondary | ICD-10-CM | POA: Diagnosis not present

## 2023-02-05 DIAGNOSIS — G894 Chronic pain syndrome: Secondary | ICD-10-CM | POA: Diagnosis not present

## 2023-02-05 DIAGNOSIS — I10 Essential (primary) hypertension: Secondary | ICD-10-CM | POA: Diagnosis not present

## 2023-02-05 DIAGNOSIS — R6889 Other general symptoms and signs: Secondary | ICD-10-CM | POA: Diagnosis not present

## 2023-02-05 DIAGNOSIS — J449 Chronic obstructive pulmonary disease, unspecified: Secondary | ICD-10-CM | POA: Diagnosis not present

## 2023-02-05 DIAGNOSIS — C787 Secondary malignant neoplasm of liver and intrahepatic bile duct: Secondary | ICD-10-CM | POA: Diagnosis not present

## 2023-02-05 DIAGNOSIS — M069 Rheumatoid arthritis, unspecified: Secondary | ICD-10-CM | POA: Diagnosis not present

## 2023-02-05 DIAGNOSIS — Z452 Encounter for adjustment and management of vascular access device: Secondary | ICD-10-CM | POA: Diagnosis not present

## 2023-02-05 DIAGNOSIS — I252 Old myocardial infarction: Secondary | ICD-10-CM | POA: Diagnosis not present

## 2023-02-05 LAB — CBC WITH DIFFERENTIAL/PLATELET
Abs Immature Granulocytes: 0.02 10*3/uL (ref 0.00–0.07)
Basophils Absolute: 0 10*3/uL (ref 0.0–0.1)
Basophils Relative: 1 %
Eosinophils Absolute: 0.1 10*3/uL (ref 0.0–0.5)
Eosinophils Relative: 2 %
HCT: 34.8 % — ABNORMAL LOW (ref 39.0–52.0)
Hemoglobin: 10.5 g/dL — ABNORMAL LOW (ref 13.0–17.0)
Immature Granulocytes: 0 %
Lymphocytes Relative: 19 %
Lymphs Abs: 1 10*3/uL (ref 0.7–4.0)
MCH: 27.8 pg (ref 26.0–34.0)
MCHC: 30.2 g/dL (ref 30.0–36.0)
MCV: 92.1 fL (ref 80.0–100.0)
Monocytes Absolute: 0.4 10*3/uL (ref 0.1–1.0)
Monocytes Relative: 8 %
Neutro Abs: 3.8 10*3/uL (ref 1.7–7.7)
Neutrophils Relative %: 70 %
Platelets: 77 10*3/uL — ABNORMAL LOW (ref 150–400)
RBC: 3.78 MIL/uL — ABNORMAL LOW (ref 4.22–5.81)
RDW: 18.4 % — ABNORMAL HIGH (ref 11.5–15.5)
WBC: 5.4 10*3/uL (ref 4.0–10.5)
nRBC: 0 % (ref 0.0–0.2)

## 2023-02-05 LAB — COMPREHENSIVE METABOLIC PANEL
ALT: 31 U/L (ref 0–44)
AST: 42 U/L — ABNORMAL HIGH (ref 15–41)
Albumin: 2.3 g/dL — ABNORMAL LOW (ref 3.5–5.0)
Alkaline Phosphatase: 200 U/L — ABNORMAL HIGH (ref 38–126)
Anion gap: 8 (ref 5–15)
BUN: 15 mg/dL (ref 8–23)
CO2: 17 mmol/L — ABNORMAL LOW (ref 22–32)
Calcium: 7.8 mg/dL — ABNORMAL LOW (ref 8.9–10.3)
Chloride: 110 mmol/L (ref 98–111)
Creatinine, Ser: 0.98 mg/dL (ref 0.61–1.24)
GFR, Estimated: >60 mL/min (ref >60)
Glucose, Bld: 102 mg/dL — ABNORMAL HIGH (ref 70–99)
Potassium: 3.6 mmol/L (ref 3.5–5.1)
Sodium: 135 mmol/L (ref 135–145)
Total Bilirubin: 0.5 mg/dL (ref 0.3–1.2)
Total Protein: 5.7 g/dL — ABNORMAL LOW (ref 6.5–8.1)

## 2023-02-05 LAB — TROPONIN I (HIGH SENSITIVITY)
Troponin I (High Sensitivity): 10 ng/L (ref <18)
Troponin I (High Sensitivity): 10 ng/L (ref <18)

## 2023-02-05 LAB — BRAIN NATRIURETIC PEPTIDE: B Natriuretic Peptide: 79.1 pg/mL (ref 0.0–100.0)

## 2023-02-05 LAB — LIPASE, BLOOD: Lipase: 36 U/L (ref 11–51)

## 2023-02-05 MED ORDER — OXYCODONE HCL 5 MG PO TABS
10.0000 mg | ORAL_TABLET | Freq: Once | ORAL | Status: AC
  Administered 2023-02-05: 10 mg via ORAL
  Filled 2023-02-05: qty 2

## 2023-02-05 MED ORDER — ISOSORBIDE MONONITRATE ER 30 MG PO TB24: 15.0000 mg | ORAL_TABLET | Freq: Every day | ORAL | 3 refills | Status: DC

## 2023-02-05 MED ORDER — SODIUM CHLORIDE 0.9 % IV BOLUS
1000.0000 mL | Freq: Once | INTRAVENOUS | Status: AC
  Administered 2023-02-05: 1000 mL via INTRAVENOUS

## 2023-02-05 NOTE — Telephone Encounter (Signed)
RTC to [place], PT from [place].  Patient's Oxygen readings are 90-92%.  Patient is usually in the high 90's.  Patient is not wheezing but has diminished BS.  Requesting Home Chest Xray as patient has a recent history of Pneumonia.  Verbal ok given.  Will forward to Yellow Team for approval of Xray.

## 2023-02-05 NOTE — Telephone Encounter (Signed)
[name] [name] with [facility] requesting VO for PT. Please call back.  

## 2023-02-05 NOTE — ED Triage Notes (Signed)
Pt BIB [facility] from home due to hypotension.  Initially 80/70 with home health nurse.  Hx cardiac arrest and liver and lung cancer.  Pt normally uses wheelchair; above the knee amputation on left leg.  20g left forearm.  NS.  VS BP 108/78, HR 68, CBG 130.

## 2023-02-05 NOTE — ED Notes (Signed)
[facility] called for transport to address on file. Is 4th on the list

## 2023-02-05 NOTE — ED Provider Notes (Signed)
[place] EMERGENCY DEPARTMENT AT [facility] Provider Note   CSN: [id number] Arrival date & time: 02/05/23  1437     History  Chief Complaint  Patient presents with   Hypotension    [name] [name] is a 62 y.o. male presenting by EMS with ocncern for hypotension.  EMS reporting BP 80's on arrival, pt given 500 cc fluid and BP improved.  Pt denying recent fevers, chills.  Reports chronic cough for "weeks."  Denies smoking hx.  Med records reviewed -hx of aspiration PNA  Cards office visit 08/09/22 Dr Jens Som  - noting hx of V Tach, pt on amiodarone, not a candidate for ICD; hx of CAD, DVT/PE on eliquis, electrolyte derangemetns, and metastatic esophageal adenocarcinoma  HPI     Home Medications Prior to Admission medications   Medication Sig Start Date End Date Taking? Authorizing Provider  albuterol (VENTOLIN HFA) 108 (90 Base) MCG/ACT inhaler INHALE 2 PUFFS INTO THE LUNGS EVERY 6 (SIX) HOURS AS NEEDED FOR WHEEZING OR SHORTNESS OF BREATH. 01/29/23   Ladene Artist, MD  amiodarone (PACERONE) 200 MG tablet Take 1 tablet (200 mg total) by mouth 2 (two) times daily. 09/27/22 09/27/23  Adron Bene, MD  amLODipine (NORVASC) 10 MG tablet Take 1 tablet (10 mg total) by mouth daily. 08/09/22 01/08/23  Carrion-Carrero, Karle Starch, MD  atorvastatin (LIPITOR) 40 MG tablet Take 1 tablet (40 mg total) by mouth daily at 6 PM. 09/14/22 01/08/23  Rocky Morel, DO  clopidogrel (PLAVIX) 75 MG tablet Take 75 mg by mouth daily. 09/14/22   [provider]  diclofenac Sodium (VOLTAREN) 1 % GEL Apply 2 g topically 4 (four) times daily. 08/09/22   Carrion-Carrero, Karle Starch, MD  DULoxetine (CYMBALTA) 30 MG capsule Take 30 mg by mouth daily as needed (sleep). 06/21/22   [provider]  ELIQUIS 5 MG TABS tablet TAKE 1 TABLET (5 MG TOTAL) BY MOUTH 2 (TWO) TIMES DAILY. 01/22/23   Rocky Morel, DO  feeding supplement (ENSURE ENLIVE / ENSURE PLUS) LIQD Take 237 mLs by mouth 3 (three)  times daily between meals. 08/09/22   Carrion-Carrero, Karle Starch, MD  ferrous sulfate 325 (65 FE) MG tablet Take 1 tablet (325 mg total) by mouth daily with breakfast for 100 doses. 11/30/22 03/10/23  Rocky Morel, DO  fluticasone-salmeterol (ADVAIR HFA) [phone] MCG/ACT inhaler Inhale 2 puffs into the lungs 2 (two) times daily. 10/03/22   Adron Bene, MD  folic acid (FOLVITE) 1 MG tablet Take 1 mg by mouth daily.    [provider]  gabapentin (NEURONTIN) 300 MG capsule Take 1 capsule (300 mg total) by mouth 2 (two) times daily. Daily or twice daily if tolerated. 11/09/22   Rocky Morel, DO  isosorbide mononitrate (IMDUR) 30 MG 24 hr tablet Take 0.5 tablets (15 mg total) by mouth daily. 02/05/23 03/07/23  Terald Sleeper, MD  lidocaine-prilocaine (EMLA) cream Apply 1 Application topically as directed. Apply to port site 2 hours prior to stick and cover with Press-and-Seal to numb port before access 05/29/22   Ladene Artist, MD  loperamide (IMODIUM A-D) 2 MG tablet Take 2 mg by mouth 4 (four) times daily as needed for diarrhea or loose stools.    [provider]  meloxicam (MOBIC) 7.5 MG tablet Take 1 tablet (7.5 mg total) by mouth daily as needed for pain. 11/30/22 03/30/23  Rocky Morel, DO  methotrexate (RHEUMATREX) 2.5 MG tablet Take 3 tablets (7.5 mg total) by mouth once a week. Caution:Chemotherapy. Protect from light. 10/17/22  Rocky Morel, DO  ondansetron (ZOFRAN) 8 MG tablet START TAKING 72 HOURS AFTER CHEMOTHERAPY TREATMENT. 12/15/22   Ladene Artist, MD  Oxycodone HCl 10 MG TABS Take 10 mg by mouth every 6 (six) hours. 09/14/22   [provider]  potassium chloride (KLOR-CON M10) 10 MEQ tablet TAKE 2 TABLETS(20 MEQ) BY MOUTH DAILY Patient taking differently: Take 10 mEq by mouth 2 (two) times daily. 10/19/22   Ladene Artist, MD  prochlorperazine (COMPAZINE) 10 MG tablet TAKE 1 TABLET (10 MG TOTAL) BY MOUTH EVERY 6 (SIX) HOURS AS NEEDED. 01/29/23   Ladene Artist, MD      Allergies    Dilaudid [hydromorphone hcl], Ramipril, and Tramadol    Review of Systems   Review of Systems  Physical Exam Updated Vital Signs BP 139/88   Pulse 66   Temp 98.2 F (36.8 C) (Oral)   Resp (!) 22   Ht 5\' 8"  (1.727 m)   Wt 61.2 kg   SpO2 96%   BMI 20.53 kg/m  Physical Exam Constitutional:      General: He is not in acute distress. HENT:     Head: Normocephalic and atraumatic.     Comments: Tachy mucous membranes Eyes:     Conjunctiva/sclera: Conjunctivae normal.     Pupils: Pupils are equal, round, and reactive to light.  Cardiovascular:     Rate and Rhythm: Normal rate and regular rhythm.  Pulmonary:     Effort: Pulmonary effort is normal. No respiratory distress.  Abdominal:     General: There is no distension.     Tenderness: There is no abdominal tenderness.  Musculoskeletal:     Comments: Left lower extremity amputation  Skin:    General: Skin is warm and dry.  Neurological:     General: No focal deficit present.     Mental Status: He is alert. Mental status is at baseline.  Psychiatric:        Mood and Affect: Mood normal.        Behavior: Behavior normal.     ED Results / Procedures / Treatments   Labs (all labs ordered are listed, but only abnormal results are displayed) Labs Reviewed  COMPREHENSIVE METABOLIC PANEL - Abnormal; Notable for the following components:      Result Value   CO2 17 (*)    Glucose, Bld 102 (*)    Calcium 7.8 (*)    Total Protein 5.7 (*)    Albumin 2.3 (*)    AST 42 (*)    Alkaline Phosphatase 200 (*)    All other components within normal limits  CBC WITH DIFFERENTIAL/PLATELET - Abnormal; Notable for the following components:   RBC 3.78 (*)    Hemoglobin 10.5 (*)    HCT 34.8 (*)    RDW 18.4 (*)    Platelets 77 (*)    All other components within normal limits  LIPASE, BLOOD  BRAIN NATRIURETIC PEPTIDE  URINALYSIS, ROUTINE W REFLEX MICROSCOPIC  TROPONIN I (HIGH SENSITIVITY)  TROPONIN I  (HIGH SENSITIVITY)    EKG EKG Interpretation Date/Time:  Monday February 05 2023 14:51:37 EDT Ventricular Rate:  63 PR Interval:  143 QRS Duration:  121 QT Interval:  498 QTC Calculation: 510 R Axis:   196  Text Interpretation: Sinus rhythm Nonspecific intraventricular conduction delay Probable anterior infarct, age indeterminate Confirmed by Alvester Chou ([id number]) on 02/05/2023 3:11:05 PM  Radiology DG Chest 2 View  Result Date: 02/05/2023 CLINICAL DATA:  Pneumonia. EXAM: CHEST -  2 VIEW COMPARISON:  X-ray 12/30/2022 FINDINGS: Right IJ chest port with tip along the central SVC right atrial junction region. Hyperinflation. No consolidation, pneumothorax, effusion or edema. Normal cardiopericardial silhouette with calcified and tortuous aorta. Overlapping cardiac leads. Previous hazy lung opacities are improved. IMPRESSION: Chest port. Hyperinflation.  No acute cardiopulmonary disease Electronically Signed   By: Karen Kays M.D.   On: 02/05/2023 16:14    Procedures Procedures    Medications Ordered in ED Medications  sodium chloride 0.9 % bolus 1,000 mL (0 mLs Intravenous Stopped 02/05/23 2022)  oxyCODONE (Oxy IR/ROXICODONE) immediate release tablet 10 mg (10 mg Oral Given 02/05/23 1703)    ED Course/ Medical Decision Making/ A&P                                 Medical Decision Making Amount and/or Complexity of Data Reviewed Labs: ordered. Radiology: ordered.  Risk Prescription drug management.   This patient presents to the ED with concern for reported hypotension at home. This involves an extensive number of treatment options, and is a complaint that carries with it a high risk of complications and morbidity.  The differential diagnosis includes dehydration versus arrhythmia versus anemia versus other  Co-morbidities that complicate the patient evaluation: Poor oral intake at higher risk of dehydration  Additional history obtained from EMS  External records from outside  source obtained and reviewed including office report of potential "hypoxia" from today, however the patient is not hypoxic or in respiratory distress on arrival today.  I ordered and personally interpreted labs.  The pertinent results include: No emergent findings  I ordered imaging studies including x-ray of the chest I independently visualized and interpreted imaging which showed hyperinflated lungs with no emergent findings I agree with the radiologist interpretation  The patient was maintained on a cardiac monitor.  I personally viewed and interpreted the cardiac monitored which showed an underlying rhythm of: Regular heart rate  Per my interpretation the patient's ECG shows no acute ischemic findings  I ordered medication including IV fluids for hydration  I have reviewed the patients home medicines and have made adjustments as needed  Test Considered: Low suspicion for acute PE.  No hypoxia, tachycardia, or immediate risk factors for PE   After the interventions noted above, I reevaluated the patient and found that they have: stayed the same  Patient asymptomatic care for the entire observation stay in the emergency department.  Dispostion:  After consideration of the diagnostic results and the patients response to treatment, I feel that the patent would benefit from outpatient follow-up.  Although hospitalization was considered, at this time the patient has had stable and gradually rising blood pressure here in the ED.  He does not have evidence of sepsis, cardiogenic shock, PE, or other life-threatening causes of hypotension.  I suspect this is likely related to poor volume intake and perhaps overmedication with hypertensive meds.  We will reduce his Imdur dosing from 30 mg to 15 mg daily.  He is also on amlodipine which we will maintain for now.  He will need to follow-up with his cardiologist.  But he is stable for discharge and comfortable with this plan.  Of note documented  hypoxia events in chart were erroneous readings - no hypoxia here in the ED; stable on room air.         Final Clinical Impression(s) / ED Diagnoses Final diagnoses:  Hypotension, unspecified hypotension type  Rx / DC Orders ED Discharge Orders          Ordered    isosorbide mononitrate (IMDUR) 30 MG 24 hr tablet  Daily        02/05/23 1815              Terald Sleeper, MD 02/05/23 2231

## 2023-02-05 NOTE — Discharge Instructions (Addendum)
You were brought to the ER for low blood pressure today.  Your vital signs and blood pressure were normal in the ER after receiving IV fluids.  It is possible you are dehydrated.  It is also possible that your dose of blood pressure medicines are too high.  We decided to reduce your dosing of isosorbide mononitrate, or Imdur, from 30 mg daily to 15 mg daily.  For now you can cut these tablets in half.  Please call your cardiologist schedule follow-up appointment.  Make sure you are continue to drink plenty of water at home.

## 2023-02-05 NOTE — ED Notes (Signed)
MD Pickering at bedside.  

## 2023-02-07 ENCOUNTER — Telehealth: Payer: Self-pay | Admitting: *Deleted

## 2023-02-07 ENCOUNTER — Other Ambulatory Visit: Payer: Self-pay | Admitting: Oncology

## 2023-02-07 DIAGNOSIS — Z993 Dependence on wheelchair: Secondary | ICD-10-CM | POA: Diagnosis not present

## 2023-02-07 DIAGNOSIS — R918 Other nonspecific abnormal finding of lung field: Secondary | ICD-10-CM | POA: Diagnosis not present

## 2023-02-07 DIAGNOSIS — I739 Peripheral vascular disease, unspecified: Secondary | ICD-10-CM | POA: Diagnosis not present

## 2023-02-07 DIAGNOSIS — M5137 Other intervertebral disc degeneration, lumbosacral region: Secondary | ICD-10-CM | POA: Diagnosis not present

## 2023-02-07 DIAGNOSIS — I2510 Atherosclerotic heart disease of native coronary artery without angina pectoris: Secondary | ICD-10-CM | POA: Diagnosis not present

## 2023-02-07 DIAGNOSIS — K219 Gastro-esophageal reflux disease without esophagitis: Secondary | ICD-10-CM | POA: Diagnosis not present

## 2023-02-07 DIAGNOSIS — I10 Essential (primary) hypertension: Secondary | ICD-10-CM | POA: Diagnosis not present

## 2023-02-07 DIAGNOSIS — I252 Old myocardial infarction: Secondary | ICD-10-CM | POA: Diagnosis not present

## 2023-02-07 DIAGNOSIS — I493 Ventricular premature depolarization: Secondary | ICD-10-CM | POA: Diagnosis not present

## 2023-02-07 DIAGNOSIS — J439 Emphysema, unspecified: Secondary | ICD-10-CM | POA: Diagnosis not present

## 2023-02-07 DIAGNOSIS — C159 Malignant neoplasm of esophagus, unspecified: Secondary | ICD-10-CM | POA: Diagnosis not present

## 2023-02-07 DIAGNOSIS — M1990 Unspecified osteoarthritis, unspecified site: Secondary | ICD-10-CM | POA: Diagnosis not present

## 2023-02-07 DIAGNOSIS — Z8673 Personal history of transient ischemic attack (TIA), and cerebral infarction without residual deficits: Secondary | ICD-10-CM | POA: Diagnosis not present

## 2023-02-07 DIAGNOSIS — E43 Unspecified severe protein-calorie malnutrition: Secondary | ICD-10-CM | POA: Diagnosis not present

## 2023-02-07 DIAGNOSIS — C787 Secondary malignant neoplasm of liver and intrahepatic bile duct: Secondary | ICD-10-CM | POA: Diagnosis not present

## 2023-02-07 DIAGNOSIS — E785 Hyperlipidemia, unspecified: Secondary | ICD-10-CM | POA: Diagnosis not present

## 2023-02-07 DIAGNOSIS — G931 Anoxic brain damage, not elsewhere classified: Secondary | ICD-10-CM | POA: Diagnosis not present

## 2023-02-07 DIAGNOSIS — D630 Anemia in neoplastic disease: Secondary | ICD-10-CM | POA: Diagnosis not present

## 2023-02-07 DIAGNOSIS — Z791 Long term (current) use of non-steroidal anti-inflammatories (NSAID): Secondary | ICD-10-CM | POA: Diagnosis not present

## 2023-02-07 DIAGNOSIS — G894 Chronic pain syndrome: Secondary | ICD-10-CM | POA: Diagnosis not present

## 2023-02-07 DIAGNOSIS — J449 Chronic obstructive pulmonary disease, unspecified: Secondary | ICD-10-CM | POA: Diagnosis not present

## 2023-02-07 DIAGNOSIS — Z7902 Long term (current) use of antithrombotics/antiplatelets: Secondary | ICD-10-CM | POA: Diagnosis not present

## 2023-02-07 DIAGNOSIS — M069 Rheumatoid arthritis, unspecified: Secondary | ICD-10-CM | POA: Diagnosis not present

## 2023-02-07 NOTE — Telephone Encounter (Signed)
Call from [place], RN [facility]- patient has a reddich rash on buttocks, Groin area and Scrotum. Has had for a couple f weeks.   Pimply like. Has used Desitin, Baby powder and Vaseline.  Request for medication for yeast.  Please send to the [facility].

## 2023-02-08 ENCOUNTER — Inpatient Hospital Stay: Payer: 59

## 2023-02-08 ENCOUNTER — Inpatient Hospital Stay: Payer: 59 | Admitting: Oncology

## 2023-02-08 ENCOUNTER — Other Ambulatory Visit: Payer: Self-pay

## 2023-02-08 ENCOUNTER — Telehealth: Payer: Self-pay | Admitting: *Deleted

## 2023-02-08 ENCOUNTER — Telehealth: Payer: Self-pay

## 2023-02-08 DIAGNOSIS — C159 Malignant neoplasm of esophagus, unspecified: Secondary | ICD-10-CM

## 2023-02-08 DIAGNOSIS — C801 Malignant (primary) neoplasm, unspecified: Secondary | ICD-10-CM

## 2023-02-08 NOTE — Telephone Encounter (Signed)
[name] [name] was due today at 0800 for lab-"no show". Attempted to reach him or [name] [name] without success.

## 2023-02-08 NOTE — Telephone Encounter (Signed)
02/08/23- Patient did not make it to appt on 02/08/23. Called the main number on account, a womane answered the call and stated she would get him to call. Also called Emergency contact Roe Coombs and he told me to call Emergency contact [name]- vmail box full

## 2023-02-11 DIAGNOSIS — J439 Emphysema, unspecified: Secondary | ICD-10-CM | POA: Diagnosis not present

## 2023-02-11 DIAGNOSIS — M069 Rheumatoid arthritis, unspecified: Secondary | ICD-10-CM | POA: Diagnosis not present

## 2023-02-11 DIAGNOSIS — I471 Supraventricular tachycardia, unspecified: Secondary | ICD-10-CM | POA: Diagnosis not present

## 2023-02-11 DIAGNOSIS — J449 Chronic obstructive pulmonary disease, unspecified: Secondary | ICD-10-CM | POA: Diagnosis not present

## 2023-02-11 DIAGNOSIS — I739 Peripheral vascular disease, unspecified: Secondary | ICD-10-CM | POA: Diagnosis not present

## 2023-02-12 ENCOUNTER — Telehealth: Payer: Self-pay

## 2023-02-12 DIAGNOSIS — F17210 Nicotine dependence, cigarettes, uncomplicated: Secondary | ICD-10-CM | POA: Diagnosis not present

## 2023-02-12 DIAGNOSIS — G4701 Insomnia due to medical condition: Secondary | ICD-10-CM | POA: Diagnosis not present

## 2023-02-12 DIAGNOSIS — Z87891 Personal history of nicotine dependence: Secondary | ICD-10-CM | POA: Diagnosis not present

## 2023-02-12 DIAGNOSIS — B372 Candidiasis of skin and nail: Secondary | ICD-10-CM | POA: Diagnosis not present

## 2023-02-12 DIAGNOSIS — M25511 Pain in right shoulder: Secondary | ICD-10-CM | POA: Diagnosis not present

## 2023-02-12 DIAGNOSIS — S78112A Complete traumatic amputation at level between left hip and knee, initial encounter: Secondary | ICD-10-CM | POA: Diagnosis not present

## 2023-02-12 DIAGNOSIS — M069 Rheumatoid arthritis, unspecified: Secondary | ICD-10-CM | POA: Diagnosis not present

## 2023-02-12 DIAGNOSIS — M25512 Pain in left shoulder: Secondary | ICD-10-CM | POA: Diagnosis not present

## 2023-02-12 NOTE — Telephone Encounter (Signed)
Transition Care Management Unsuccessful Follow-up Telephone Call  Date of discharge and from where:  02/05/2023 [facility]  Attempts:  2nd Attempt  Reason for unsuccessful TCM follow-up call:  Voice mail full  Neal Oshea [facility]  [facility] Population Health Community Resource Care Guide   [email or web address]  ?? [phone]   Website: [email or web address]  [email or web address]

## 2023-02-12 NOTE — Telephone Encounter (Signed)
Transition Care Management Unsuccessful Follow-up Telephone Call  Date of discharge and from where:  02/05/2023 [facility]  Attempts:  1st Attempt  Reason for unsuccessful TCM follow-up call:  No answer/busy  Tienna Bienkowski [facility]  [facility] Population Health Community Resource Care Guide   [email or web address]  ?? [phone]   Website: [email or web address]  [email or web address]

## 2023-02-13 DIAGNOSIS — N179 Acute kidney failure, unspecified: Secondary | ICD-10-CM | POA: Diagnosis not present

## 2023-02-13 DIAGNOSIS — I739 Peripheral vascular disease, unspecified: Secondary | ICD-10-CM | POA: Diagnosis not present

## 2023-02-13 DIAGNOSIS — M1990 Unspecified osteoarthritis, unspecified site: Secondary | ICD-10-CM | POA: Diagnosis not present

## 2023-02-13 DIAGNOSIS — J449 Chronic obstructive pulmonary disease, unspecified: Secondary | ICD-10-CM | POA: Diagnosis not present

## 2023-02-13 DIAGNOSIS — M5137 Other intervertebral disc degeneration, lumbosacral region: Secondary | ICD-10-CM | POA: Diagnosis not present

## 2023-02-13 DIAGNOSIS — J439 Emphysema, unspecified: Secondary | ICD-10-CM | POA: Diagnosis not present

## 2023-02-13 DIAGNOSIS — K219 Gastro-esophageal reflux disease without esophagitis: Secondary | ICD-10-CM | POA: Diagnosis not present

## 2023-02-13 DIAGNOSIS — I2510 Atherosclerotic heart disease of native coronary artery without angina pectoris: Secondary | ICD-10-CM | POA: Diagnosis not present

## 2023-02-13 DIAGNOSIS — C787 Secondary malignant neoplasm of liver and intrahepatic bile duct: Secondary | ICD-10-CM | POA: Diagnosis not present

## 2023-02-13 DIAGNOSIS — E43 Unspecified severe protein-calorie malnutrition: Secondary | ICD-10-CM | POA: Diagnosis not present

## 2023-02-13 DIAGNOSIS — M069 Rheumatoid arthritis, unspecified: Secondary | ICD-10-CM | POA: Diagnosis not present

## 2023-02-13 DIAGNOSIS — I10 Essential (primary) hypertension: Secondary | ICD-10-CM | POA: Diagnosis not present

## 2023-02-13 DIAGNOSIS — G931 Anoxic brain damage, not elsewhere classified: Secondary | ICD-10-CM | POA: Diagnosis not present

## 2023-02-13 DIAGNOSIS — J690 Pneumonitis due to inhalation of food and vomit: Secondary | ICD-10-CM | POA: Diagnosis not present

## 2023-02-13 DIAGNOSIS — I959 Hypotension, unspecified: Secondary | ICD-10-CM | POA: Diagnosis not present

## 2023-02-13 DIAGNOSIS — G894 Chronic pain syndrome: Secondary | ICD-10-CM | POA: Diagnosis not present

## 2023-02-13 DIAGNOSIS — I493 Ventricular premature depolarization: Secondary | ICD-10-CM | POA: Diagnosis not present

## 2023-02-13 DIAGNOSIS — C159 Malignant neoplasm of esophagus, unspecified: Secondary | ICD-10-CM | POA: Diagnosis not present

## 2023-02-13 DIAGNOSIS — D630 Anemia in neoplastic disease: Secondary | ICD-10-CM | POA: Diagnosis not present

## 2023-02-13 DIAGNOSIS — R918 Other nonspecific abnormal finding of lung field: Secondary | ICD-10-CM | POA: Diagnosis not present

## 2023-02-13 DIAGNOSIS — I252 Old myocardial infarction: Secondary | ICD-10-CM | POA: Diagnosis not present

## 2023-02-13 DIAGNOSIS — E872 Acidosis, unspecified: Secondary | ICD-10-CM | POA: Diagnosis not present

## 2023-02-13 DIAGNOSIS — E785 Hyperlipidemia, unspecified: Secondary | ICD-10-CM | POA: Diagnosis not present

## 2023-02-13 DIAGNOSIS — C7800 Secondary malignant neoplasm of unspecified lung: Secondary | ICD-10-CM | POA: Diagnosis not present

## 2023-02-14 ENCOUNTER — Other Ambulatory Visit: Payer: Self-pay | Admitting: Oncology

## 2023-02-15 ENCOUNTER — Other Ambulatory Visit: Payer: Self-pay

## 2023-02-16 ENCOUNTER — Other Ambulatory Visit: Payer: Self-pay | Admitting: Student

## 2023-02-16 DIAGNOSIS — I739 Peripheral vascular disease, unspecified: Secondary | ICD-10-CM | POA: Diagnosis not present

## 2023-02-16 DIAGNOSIS — R918 Other nonspecific abnormal finding of lung field: Secondary | ICD-10-CM | POA: Diagnosis not present

## 2023-02-16 DIAGNOSIS — J690 Pneumonitis due to inhalation of food and vomit: Secondary | ICD-10-CM | POA: Diagnosis not present

## 2023-02-16 DIAGNOSIS — M1990 Unspecified osteoarthritis, unspecified site: Secondary | ICD-10-CM | POA: Diagnosis not present

## 2023-02-16 DIAGNOSIS — C787 Secondary malignant neoplasm of liver and intrahepatic bile duct: Secondary | ICD-10-CM | POA: Diagnosis not present

## 2023-02-16 DIAGNOSIS — G894 Chronic pain syndrome: Secondary | ICD-10-CM | POA: Diagnosis not present

## 2023-02-16 DIAGNOSIS — E872 Acidosis, unspecified: Secondary | ICD-10-CM | POA: Diagnosis not present

## 2023-02-16 DIAGNOSIS — C159 Malignant neoplasm of esophagus, unspecified: Secondary | ICD-10-CM | POA: Diagnosis not present

## 2023-02-16 DIAGNOSIS — N179 Acute kidney failure, unspecified: Secondary | ICD-10-CM | POA: Diagnosis not present

## 2023-02-16 DIAGNOSIS — D630 Anemia in neoplastic disease: Secondary | ICD-10-CM | POA: Diagnosis not present

## 2023-02-16 DIAGNOSIS — I252 Old myocardial infarction: Secondary | ICD-10-CM | POA: Diagnosis not present

## 2023-02-16 DIAGNOSIS — I959 Hypotension, unspecified: Secondary | ICD-10-CM | POA: Diagnosis not present

## 2023-02-16 DIAGNOSIS — M5137 Other intervertebral disc degeneration, lumbosacral region: Secondary | ICD-10-CM | POA: Diagnosis not present

## 2023-02-16 DIAGNOSIS — I493 Ventricular premature depolarization: Secondary | ICD-10-CM | POA: Diagnosis not present

## 2023-02-16 DIAGNOSIS — I10 Essential (primary) hypertension: Secondary | ICD-10-CM | POA: Diagnosis not present

## 2023-02-16 DIAGNOSIS — K219 Gastro-esophageal reflux disease without esophagitis: Secondary | ICD-10-CM | POA: Diagnosis not present

## 2023-02-16 DIAGNOSIS — I2699 Other pulmonary embolism without acute cor pulmonale: Secondary | ICD-10-CM

## 2023-02-16 DIAGNOSIS — G931 Anoxic brain damage, not elsewhere classified: Secondary | ICD-10-CM | POA: Diagnosis not present

## 2023-02-16 DIAGNOSIS — J449 Chronic obstructive pulmonary disease, unspecified: Secondary | ICD-10-CM | POA: Diagnosis not present

## 2023-02-16 DIAGNOSIS — I2510 Atherosclerotic heart disease of native coronary artery without angina pectoris: Secondary | ICD-10-CM | POA: Diagnosis not present

## 2023-02-16 DIAGNOSIS — J439 Emphysema, unspecified: Secondary | ICD-10-CM | POA: Diagnosis not present

## 2023-02-16 DIAGNOSIS — M069 Rheumatoid arthritis, unspecified: Secondary | ICD-10-CM | POA: Diagnosis not present

## 2023-02-16 DIAGNOSIS — C7800 Secondary malignant neoplasm of unspecified lung: Secondary | ICD-10-CM | POA: Diagnosis not present

## 2023-02-16 DIAGNOSIS — E43 Unspecified severe protein-calorie malnutrition: Secondary | ICD-10-CM | POA: Diagnosis not present

## 2023-02-16 DIAGNOSIS — E785 Hyperlipidemia, unspecified: Secondary | ICD-10-CM | POA: Diagnosis not present

## 2023-02-18 ENCOUNTER — Other Ambulatory Visit: Payer: Self-pay | Admitting: Oncology

## 2023-02-19 DIAGNOSIS — I959 Hypotension, unspecified: Secondary | ICD-10-CM | POA: Diagnosis not present

## 2023-02-19 DIAGNOSIS — I739 Peripheral vascular disease, unspecified: Secondary | ICD-10-CM | POA: Diagnosis not present

## 2023-02-19 DIAGNOSIS — C7800 Secondary malignant neoplasm of unspecified lung: Secondary | ICD-10-CM | POA: Diagnosis not present

## 2023-02-19 DIAGNOSIS — G931 Anoxic brain damage, not elsewhere classified: Secondary | ICD-10-CM | POA: Diagnosis not present

## 2023-02-19 DIAGNOSIS — J449 Chronic obstructive pulmonary disease, unspecified: Secondary | ICD-10-CM | POA: Diagnosis not present

## 2023-02-19 DIAGNOSIS — M5137 Other intervertebral disc degeneration, lumbosacral region: Secondary | ICD-10-CM | POA: Diagnosis not present

## 2023-02-19 DIAGNOSIS — K219 Gastro-esophageal reflux disease without esophagitis: Secondary | ICD-10-CM | POA: Diagnosis not present

## 2023-02-19 DIAGNOSIS — I10 Essential (primary) hypertension: Secondary | ICD-10-CM | POA: Diagnosis not present

## 2023-02-19 DIAGNOSIS — E43 Unspecified severe protein-calorie malnutrition: Secondary | ICD-10-CM | POA: Diagnosis not present

## 2023-02-19 DIAGNOSIS — C787 Secondary malignant neoplasm of liver and intrahepatic bile duct: Secondary | ICD-10-CM | POA: Diagnosis not present

## 2023-02-19 DIAGNOSIS — J690 Pneumonitis due to inhalation of food and vomit: Secondary | ICD-10-CM | POA: Diagnosis not present

## 2023-02-19 DIAGNOSIS — I2510 Atherosclerotic heart disease of native coronary artery without angina pectoris: Secondary | ICD-10-CM | POA: Diagnosis not present

## 2023-02-19 DIAGNOSIS — D630 Anemia in neoplastic disease: Secondary | ICD-10-CM | POA: Diagnosis not present

## 2023-02-19 DIAGNOSIS — I493 Ventricular premature depolarization: Secondary | ICD-10-CM | POA: Diagnosis not present

## 2023-02-19 DIAGNOSIS — M069 Rheumatoid arthritis, unspecified: Secondary | ICD-10-CM | POA: Diagnosis not present

## 2023-02-19 DIAGNOSIS — R918 Other nonspecific abnormal finding of lung field: Secondary | ICD-10-CM | POA: Diagnosis not present

## 2023-02-19 DIAGNOSIS — E785 Hyperlipidemia, unspecified: Secondary | ICD-10-CM | POA: Diagnosis not present

## 2023-02-19 DIAGNOSIS — E872 Acidosis, unspecified: Secondary | ICD-10-CM | POA: Diagnosis not present

## 2023-02-19 DIAGNOSIS — M1990 Unspecified osteoarthritis, unspecified site: Secondary | ICD-10-CM | POA: Diagnosis not present

## 2023-02-19 DIAGNOSIS — N179 Acute kidney failure, unspecified: Secondary | ICD-10-CM | POA: Diagnosis not present

## 2023-02-19 DIAGNOSIS — I252 Old myocardial infarction: Secondary | ICD-10-CM | POA: Diagnosis not present

## 2023-02-19 DIAGNOSIS — G894 Chronic pain syndrome: Secondary | ICD-10-CM | POA: Diagnosis not present

## 2023-02-19 DIAGNOSIS — J439 Emphysema, unspecified: Secondary | ICD-10-CM | POA: Diagnosis not present

## 2023-02-19 DIAGNOSIS — C159 Malignant neoplasm of esophagus, unspecified: Secondary | ICD-10-CM | POA: Diagnosis not present

## 2023-02-20 ENCOUNTER — Inpatient Hospital Stay: Payer: 59 | Admitting: Nurse Practitioner

## 2023-02-20 ENCOUNTER — Telehealth: Payer: Self-pay

## 2023-02-20 NOTE — Progress Notes (Deleted)
Palliative Medicine [facility]  [phone]) [phone] [phone]) [phone]   Name: [name] [name] Date: 02/20/2023 MRN: [id number]  DOB: 10/24/1960  Patient Care Team: Rocky Morel, DO as PCP - General Ladona Ridgel Doylene Canning, MD as PCP - Electrophysiology (Cardiology) Rollene Rotunda, MD as PCP - Cardiology (Cardiology) Ewing Schlein, MD as Referring Physician (Pain Medicine) Nada Libman, MD as Consulting Physician (Vascular Surgery) Sallye Lat, MD as Consulting Physician (Ophthalmology) Jerrell Mylar, Jacquelyne Balint, RN as Oncology Nurse Navigator Ladene Artist, MD as Consulting Physician (Oncology)    REASON FOR CONSULTATION: [name] [name] is a 62 y.o. male with oncologic medical history including metastatic esophageal cancer (02/2022) with metastatic disease to the liver and lymph nodes, as well as a previous history of CAD status post CABG (2020), MI (2023), DVT, COPD, arthritis, and hypertension.  Palliative ask to see for symptom management and goals of care.   SOCIAL HISTORY:     reports that he quit smoking about 4 years ago. His smoking use included cigarettes. He started smoking about 24 years ago. He has a 5 pack-year smoking history. He has never used smokeless tobacco. He reports that he does not drink alcohol and does not use drugs.  ADVANCE DIRECTIVES:  ***  CODE STATUS: {Palliative Code status:23503}  PAST MEDICAL HISTORY: Past Medical History:  Diagnosis Date   Acute respiratory failure (HCC) 01/31/2022   Bilateral shoulder pain 08/20/2012   Bite wound of left hand 09/27/2021   CAD (coronary artery disease)    Occluded nondominent RCA managed medically - August 2023   Cardiac arrest [facility])    OOH VF - August 2023   Community acquired pneumonia 11/07/2011   Critical limb ischemia with history of revascularization of same extremity (HCC) 04/28/2020   DDD (degenerative disc disease), lumbosacral     and grade 2 slip   DVT (deep venous  thrombosis) (HCC) 01/31/2022   Esophageal cancer (HCC)    Adenocarcinoma with metastasis   GERD (gastroesophageal reflux disease)    History of anemia    History of COVID-19 05/10/2019   History of critical lower limb ischemia 02/19/2019   Hypertension    Impingement syndrome of shoulder region    Ischemic ulcer of toe of left foot (HCC)    Liver mass    Low back pain without sciatica    Lupus (HCC)    Multiple fractures of ribs, bilateral, due to CPR trauma    Onychomycosis    Paronychia of great toe    Peripheral vascular disease (HCC)    Pressure injury of skin    Pulmonary embolus (HCC)    RA (rheumatoid arthritis) (HCC)    Rotator cuff tear 11/04/2014   Sternal fracture    Stroke [facility])     PAST SURGICAL HISTORY:  Past Surgical History:  Procedure Laterality Date   ABDOMINAL AORTAGRAM  02/20/2019   ABDOMINAL AORTOGRAM W/LOWER EXTREMITY N/A 02/20/2019   Procedure: ABDOMINAL AORTOGRAM W/LOWER EXTREMITY;  Surgeon: Cephus Shelling, MD;  [facility]: [facility] INV[facility];  Service: Cardiovascular;  Laterality: N/A;   ACHILLES TENDON SURGERY Bilateral    AMPUTATION Left 05/11/2020   Procedure: LEFT ABOVE KNEE AMPUTATION;  Surgeon: Chuck Hint, MD;  Location[facility] OR;  Service: Vascular;  Laterality: Left;   ENDARTERECTOMY POPLITEAL Left 05/01/2020   Procedure: ENDARTERECTOMY POPLITEAL;  Surgeon: Maeola Harman, MD;  Location: [facility] OR;  Service: Vascular;  Laterality: Left;   FASCIOTOMY Left 05/01/2020   Procedure: FASCIOTOMY;  Surgeon: Maeola Harman, MD;  Location: [facility] OR;  Service: Vascular;  Laterality: Left;   FEMORAL-POPLITEAL BYPASS GRAFT Left 02/26/2019   Procedure: BYPASS GRAFT FEMORAL-POPLITEAL ARTERY LEFT LEG USING 6mm PROPATEN GRAFT;  Surgeon: Nada Libman, MD;  Location: [facility] OR;  Service: Vascular;  Laterality: Left;   INTRAOPERATIVE ARTERIOGRAM Left 01/22/2020   Procedure: INTRA OPERATIVE ARTERIOGRAM with administration of thrombolyics in  Left femoral to popliteal bypass.;  Surgeon: Cephus Shelling, MD;  Location: [facility] OR;  Service: Vascular;  Laterality: Left;   IR IMAGING GUIDED PORT INSERTION  03/07/2022   LEFT HEART CATH AND CORONARY ANGIOGRAPHY N/A 03/15/2017   Procedure: LEFT HEART CATH AND CORONARY ANGIOGRAPHY;  Surgeon: Yvonne Kendall, MD;  Location: [facility];  Service: Cardiovascular;  Laterality: N/A;   LEFT HEART CATH AND CORONARY ANGIOGRAPHY N/A 02/06/2022   Procedure: LEFT HEART CATH AND CORONARY ANGIOGRAPHY;  Surgeon: Swazil[place], Peter M, MD;  Location: [facility] INVASIVE CV LAB;  Service: Cardiovascular;  Laterality: N/A;   LOWER EXTREMITY INTERVENTION Left 04/29/2020   Procedure: LOWER EXTREMITY INTERVENTION- LYSIS;  Surgeon: Cephus Shelling, MD;  Location: [facility];  Service: Cardiovascular;  Laterality: Left;   OSTEOTOMY AND ULNAR SHORTENING Right 07/22/2002   PATCH ANGIOPLASTY Left 05/01/2020   Procedure: PATCH ANGIOPLASTY USING HEMASHIELD PLATINUM FINESSE PATCH;  Surgeon: Maeola Harman, MD;  Location: [facility] OR;  Service: Vascular;  Laterality: Left;   PERIPHERAL VASCULAR ATHERECTOMY  01/23/2020   Procedure: PERIPHERAL VASCULAR ATHERECTOMY;  Surgeon: Cephus Shelling, MD;  Location: [facility];  Service: Cardiovascular;;   PERIPHERAL VASCULAR BALLOON ANGIOPLASTY  01/23/2020   Procedure: PERIPHERAL VASCULAR BALLOON ANGIOPLASTY;  Surgeon: Cephus Shelling, MD;  Location: [facility];  Service: Cardiovascular;;   PERIPHERAL VASCULAR INTERVENTION Left 04/30/2020   Procedure: PERIPHERAL VASCULAR INTERVENTION;  Surgeon: Leonie Douglas, MD;  Location: [facility];  Service: Cardiovascular;  Laterality: Left;   PERIPHERAL VASCULAR THROMBECTOMY N/A 01/23/2020   Procedure: lysis recheck;  Surgeon: Cephus Shelling, MD;  Location: [facility];  Service: Cardiovascular;  Laterality: N/A;  + [place]    PERIPHERAL VASCULAR THROMBECTOMY N/A 04/30/2020   Procedure:  Lysis Recheck;  Surgeon: Leonie Douglas, MD;  [facility]: [facility];  Service: Cardiovascular;  Laterality: N/A;   SHOULDER ARTHROSCOPY WITH BICEPSTENOTOMY Right 03/11/2015   Procedure: SHOULDER ARTHROSCOPY WITH BICEPSTENOTOMY;  Surgeon: Loreta Ave, MD;  Location: [facility];  Service: Orthopedics;  Laterality: Right;   SHOULDER ARTHROSCOPY WITH DISTAL CLAVICLE RESECTION Left 11/19/2014   Procedure: SHOULDER ARTHROSCOPY WITH DISTAL CLAVICLE RESECTION;  Surgeon: Mckinley Jewel, MD;  Location: [facility];  Service: Orthopedics;  Laterality: Left;   SHOULDER ARTHROSCOPY WITH ROTATOR CUFF REPAIR AND SUBACROMIAL DECOMPRESSION Left 11/19/2014   Procedure: LEFT SHOULDER ARTHROSCOPY, DEBRIDEMENT DISTAL CLAVICLE EXCISION, ACROMIOPLASTY WITH ROTATOR CUFF REPAIR ;  Surgeon: Mckinley Jewel, MD;  Location: [facility];  Service: Orthopedics;  Laterality: Left;   THROMBECTOMY OF BYPASS GRAFT FEMORAL- POPLITEAL ARTERY Left 05/01/2020   Procedure: THROMBECTOMY OF LOWER EXTREMITY;  Surgeon: Maeola Harman, MD;  Location: [facility] OR;  Service: Vascular;  Laterality: Left;   TRANSFORAMINAL LUMBAR INTERBODY FUSION (TLIF) WITH PEDICLE SCREW FIXATION 1 LEVEL N/A 01/03/2018   Procedure: TRANSFORAMINAL LUMBAR INTERBODY FUSION (TLIF) LUMBAR FIVE-SACRAL ONE;  Surgeon: Venita Lick, MD;  Location: [facility] OR;  Service: Orthopedics;  Laterality: N/A;   ULTRASOUND GUIDANCE FOR VASCULAR ACCESS Right 01/22/2020   Procedure:  ULTRASOUND GUIDANCE FOR VASCULAR ACCESS Right Common Femoral Artery.;  Surgeon: Cephus Shelling, MD;  Location: [facility] OR;  Service: Vascular;  Laterality: Right;   VIDEO ASSISTED THORACOSCOPY (VATS)/DECORTICATION Right 11/21/2011   drainage of empyema    HEMATOLOGY/ONCOLOGY HISTORY:  Oncology History  Adenocarcinoma of unknown origin (HCC)  02/22/2022 Initial Diagnosis   Adenocarcinoma of unknown origin (HCC)   03/15/2022 - 03/15/2022 Chemotherapy    Patient is on Treatment Plan : COLORECTAL FOLFOX q14d x 3 months     04/06/2022 - 08/16/2022 Chemotherapy   Patient is on Treatment Plan : GASTROESOPHAGEAL FOLFOX D1,15 + Trastuzumab (6/4) D1,15 + Pembrolizumab (200) every 3 weeks, q28d     09/20/2022 -  Chemotherapy   Patient is on Treatment Plan : GASTROESOPHAGEAL Ramucirumab D1, 15 + Paclitaxel D1,8,15 q28d     Esophageal cancer, stage IV (HCC)  03/22/2022 Initial Diagnosis   Cancer of esophagus (HCC)   03/22/2022 Cancer Staging   Staging form: Esophagus - Adenocarcinoma, AJCC 8th Edition - Clinical: Stage IVB (cTX, cNX, pM1) - Signed by Ladene Artist, MD on 03/22/2022   04/06/2022 - 08/16/2022 Chemotherapy   Patient is on Treatment Plan : GASTROESOPHAGEAL FOLFOX D1,15 + Trastuzumab (6/4) D1,15 + Pembrolizumab (200) every 3 weeks, q28d     09/20/2022 -  Chemotherapy   Patient is on Treatment Plan : GASTROESOPHAGEAL Ramucirumab D1, 15 + Paclitaxel D1,8,15 q28d       ALLERGIES:  is allergic to dilaudid [hydromorphone hcl], ramipril, and tramadol.  MEDICATIONS:  Current Outpatient Medications  Medication Sig Dispense Refill   albuterol (VENTOLIN HFA) 108 (90 Base) MCG/ACT inhaler INHALE 2 PUFFS INTO THE LUNGS EVERY 6 (SIX) HOURS AS NEEDED FOR WHEEZING OR SHORTNESS OF BREATH. 8.5 g 1   amiodarone (PACERONE) 200 MG tablet Take 1 tablet (200 mg total) by mouth 2 (two) times daily. 60 tablet 11   amLODipine (NORVASC) 10 MG tablet Take 1 tablet (10 mg total) by mouth daily. 30 tablet 0   atorvastatin (LIPITOR) 40 MG tablet Take 1 tablet (40 mg total) by mouth daily at 6 PM. 30 tablet 0   clopidogrel (PLAVIX) 75 MG tablet Take 75 mg by mouth daily.     diclofenac Sodium (VOLTAREN) 1 % GEL Apply 2 g topically 4 (four) times daily. 200 g 0   DULoxetine (CYMBALTA) 30 MG capsule Take 30 mg by mouth daily as needed (sleep).     ELIQUIS 5 MG TABS tablet TAKE 1 TABLET (5 MG TOTAL) BY MOUTH 2 (TWO) TIMES DAILY. 60 tablet 0   feeding supplement  (ENSURE ENLIVE / ENSURE PLUS) LIQD Take 237 mLs by mouth 3 (three) times daily between meals. 237 mL 12   ferrous sulfate 325 (65 FE) MG tablet Take 1 tablet (325 mg total) by mouth daily with breakfast for 100 doses. 100 tablet 0   fluticasone-salmeterol (ADVAIR HFA) 45-21 MCG/ACT inhaler Inhale 2 puffs into the lungs 2 (two) times daily. 1 each 12   folic acid (FOLVITE) 1 MG tablet Take 1 mg by mouth daily.     gabapentin (NEURONTIN) 300 MG capsule Take 1 capsule (300 mg total) by mouth 2 (two) times daily. Daily or twice daily if tolerated. 60 capsule 3   isosorbide mononitrate (IMDUR) 30 MG 24 hr tablet Take 0.5 tablets (15 mg total) by mouth daily. 90 tablet 3   lidocaine-prilocaine (EMLA) cream Apply 1 Application topically as directed. Apply to port site 2 hours prior to stick and cover with Press-and-Seal to numb  port before access 30 g 1   loperamide (IMODIUM A-D) 2 MG tablet Take 2 mg by mouth 4 (four) times daily as needed for diarrhea or loose stools.     meloxicam (MOBIC) 7.5 MG tablet Take 1 tablet (7.5 mg total) by mouth daily as needed for pain. 30 tablet 3   methotrexate (RHEUMATREX) 2.5 MG tablet Take 3 tablets (7.5 mg total) by mouth once a week. Caution:Chemotherapy. Protect from light. 4 tablet 0   ondansetron (ZOFRAN) 8 MG tablet START TAKING 72 HOURS AFTER CHEMOTHERAPY TREATMENT. 30 tablet 1   Oxycodone HCl 10 MG TABS Take 10 mg by mouth every 6 (six) hours.     potassium chloride (KLOR-CON) 10 MEQ tablet TAKE 2 TABLETS(20 MEQ) BY MOUTH DAILY 180 tablet 0   prochlorperazine (COMPAZINE) 10 MG tablet TAKE 1 TABLET (10 MG TOTAL) BY MOUTH EVERY 6 (SIX) HOURS AS NEEDED. 60 tablet 1   No current facility-administered medications for this visit.    VITAL SIGNS: There were no vitals taken for this visit. There were no vitals filed for this visit.  Estimated body mass index is 20.53 kg/m as calculated from the following:   Height as of 02/05/23: 5\' 8"  (1.727 m).   Weight as of  02/05/23: 135 lb (61.2 kg).  LABS: CBC:    Component Value Date/Time   WBC 5.4 02/05/2023 1521   HGB 10.5 (L) 02/05/2023 1521   HGB 10.0 (L) 01/08/2023 0942   HGB 11.4 (L) 06/15/2022 1458   HCT 34.8 (L) 02/05/2023 1521   HCT 36.1 (L) 06/15/2022 1458   PLT 77 (L) 02/05/2023 1521   PLT 130 (L) 01/08/2023 0942   PLT 133 (L) 06/15/2022 1458   MCV 92.1 02/05/2023 1521   MCV 84 06/15/2022 1458   NEUTROABS 3.8 02/05/2023 1521   NEUTROABS 2.3 06/15/2022 1458   LYMPHSABS 1.0 02/05/2023 1521   LYMPHSABS 1.1 06/15/2022 1458   MONOABS 0.4 02/05/2023 1521   EOSABS 0.1 02/05/2023 1521   EOSABS 0.2 06/15/2022 1458   BASOSABS 0.0 02/05/2023 1521   BASOSABS 0.1 06/15/2022 1458   Comprehensive Metabolic Panel:    Component Value Date/Time   NA 135 02/05/2023 1521   NA 135 08/16/2022 1524   K 3.6 02/05/2023 1521   CL 110 02/05/2023 1521   CO2 17 (L) 02/05/2023 1521   BUN 15 02/05/2023 1521   BUN 21 08/16/2022 1524   CREATININE 0.98 02/05/2023 1521   CREATININE 0.92 01/08/2023 0942   CREATININE 0.80 11/27/2012 0956   GLUCOSE 102 (H) 02/05/2023 1521   CALCIUM 7.8 (L) 02/05/2023 1521   AST 42 (H) 02/05/2023 1521   AST 26 01/08/2023 0942   ALT 31 02/05/2023 1521   ALT 15 01/08/2023 0942   ALKPHOS 200 (H) 02/05/2023 1521   BILITOT 0.5 02/05/2023 1521   BILITOT 0.4 01/08/2023 0942   PROT 5.7 (L) 02/05/2023 1521   PROT 6.9 06/15/2022 1458   ALBUMIN 2.3 (L) 02/05/2023 1521   ALBUMIN 3.3 (L) 06/15/2022 1458    RADIOGRAPHIC STUDIES:   PERFORMANCE STATUS (ECOG) : {CHL ONC ECOG [phone]}  Review of Systems Unless otherwise noted, a complete review of systems is negative.  Physical Exam General: NAD Cardiovascular: regular rate and rhythm Pulmonary: clear ant fields Abdomen: soft, nontender, + bowel sounds Extremities: no edema, no joint deformities Skin: no rashes Neurological:  IMPRESSION: *** I introduced myself, Tola Meas RN, and Palliative's role in collaboration with  the oncology team. Concept of Palliative Care was introduced as specialized  medical care for people and their families living with serious illness.  It focuses on providing relief from the symptoms and stress of a serious illness.  The goal is to improve quality of life for both the patient and the family. Values and goals of care important to patient and family were attempted to be elicited.    We discussed *** current illness and what it means in the larger context of *** on-going co-morbidities. Natural disease trajectory and expectations were discussed.  I discussed the importance of continued conversation with family and their medical providers regarding overall plan of care and treatment options, ensuring decisions are within the context of the patients values and GOCs.  PLAN: Established therapeutic relationship. Education provided on palliative's role in collaboration with their Oncology/Radiation team. I will plan to see patient back in 2-4 weeks in collaboration to other oncology appointments.    Patient expressed understanding and was in agreement with this plan. He also understands that He can call the clinic at any time with any questions, concerns, or complaints.   Thank you for your referral and allowing Palliative to assist in [name] [name] [id number] care.   Number and complexity of problems addressed: ***HIGH - 1 or more chronic illnesses with SEVERE exacerbation, progression, or side effects of treatment - advanced cancer, pain. Any controlled substances utilized were prescribed in the context of palliative care.   Visit consisted of counseling and education dealing with the complex and emotionally intense issues of symptom management and palliative care in the setting of serious and potentially life-threatening illness.Greater than 50%  of this time was spent counseling and coordinating care related to the above assessment and plan.  Signed by: Willette Alma,  AGPCNP-BC Palliative Medicine Team/[facility]   *Please note that this is a verbal dictation therefore any spelling or grammatical errors are due to the "[employer]" system interpretation.

## 2023-02-20 NOTE — Telephone Encounter (Signed)
Pt scheduled to be seen in palliative today, pt was a no-show, thia RN attempted to call but no answer, will attempt to r/s another time.

## 2023-02-21 DIAGNOSIS — G931 Anoxic brain damage, not elsewhere classified: Secondary | ICD-10-CM | POA: Diagnosis not present

## 2023-02-21 DIAGNOSIS — M5137 Other intervertebral disc degeneration, lumbosacral region: Secondary | ICD-10-CM | POA: Diagnosis not present

## 2023-02-21 DIAGNOSIS — E43 Unspecified severe protein-calorie malnutrition: Secondary | ICD-10-CM | POA: Diagnosis not present

## 2023-02-21 DIAGNOSIS — C787 Secondary malignant neoplasm of liver and intrahepatic bile duct: Secondary | ICD-10-CM | POA: Diagnosis not present

## 2023-02-21 DIAGNOSIS — J690 Pneumonitis due to inhalation of food and vomit: Secondary | ICD-10-CM | POA: Diagnosis not present

## 2023-02-21 DIAGNOSIS — I252 Old myocardial infarction: Secondary | ICD-10-CM | POA: Diagnosis not present

## 2023-02-21 DIAGNOSIS — I739 Peripheral vascular disease, unspecified: Secondary | ICD-10-CM | POA: Diagnosis not present

## 2023-02-21 DIAGNOSIS — G894 Chronic pain syndrome: Secondary | ICD-10-CM | POA: Diagnosis not present

## 2023-02-21 DIAGNOSIS — M069 Rheumatoid arthritis, unspecified: Secondary | ICD-10-CM | POA: Diagnosis not present

## 2023-02-21 DIAGNOSIS — N179 Acute kidney failure, unspecified: Secondary | ICD-10-CM | POA: Diagnosis not present

## 2023-02-21 DIAGNOSIS — C159 Malignant neoplasm of esophagus, unspecified: Secondary | ICD-10-CM | POA: Diagnosis not present

## 2023-02-21 DIAGNOSIS — C7800 Secondary malignant neoplasm of unspecified lung: Secondary | ICD-10-CM | POA: Diagnosis not present

## 2023-02-21 DIAGNOSIS — M1990 Unspecified osteoarthritis, unspecified site: Secondary | ICD-10-CM | POA: Diagnosis not present

## 2023-02-21 DIAGNOSIS — R918 Other nonspecific abnormal finding of lung field: Secondary | ICD-10-CM | POA: Diagnosis not present

## 2023-02-21 DIAGNOSIS — J449 Chronic obstructive pulmonary disease, unspecified: Secondary | ICD-10-CM | POA: Diagnosis not present

## 2023-02-21 DIAGNOSIS — I2510 Atherosclerotic heart disease of native coronary artery without angina pectoris: Secondary | ICD-10-CM | POA: Diagnosis not present

## 2023-02-21 DIAGNOSIS — J439 Emphysema, unspecified: Secondary | ICD-10-CM | POA: Diagnosis not present

## 2023-02-21 DIAGNOSIS — K219 Gastro-esophageal reflux disease without esophagitis: Secondary | ICD-10-CM | POA: Diagnosis not present

## 2023-02-21 DIAGNOSIS — I959 Hypotension, unspecified: Secondary | ICD-10-CM | POA: Diagnosis not present

## 2023-02-21 DIAGNOSIS — E872 Acidosis, unspecified: Secondary | ICD-10-CM | POA: Diagnosis not present

## 2023-02-21 DIAGNOSIS — E785 Hyperlipidemia, unspecified: Secondary | ICD-10-CM | POA: Diagnosis not present

## 2023-02-21 DIAGNOSIS — I10 Essential (primary) hypertension: Secondary | ICD-10-CM | POA: Diagnosis not present

## 2023-02-21 DIAGNOSIS — D630 Anemia in neoplastic disease: Secondary | ICD-10-CM | POA: Diagnosis not present

## 2023-02-21 DIAGNOSIS — I493 Ventricular premature depolarization: Secondary | ICD-10-CM | POA: Diagnosis not present

## 2023-02-22 ENCOUNTER — Inpatient Hospital Stay: Payer: 59 | Admitting: Nurse Practitioner

## 2023-02-22 ENCOUNTER — Inpatient Hospital Stay: Payer: 59

## 2023-02-22 ENCOUNTER — Encounter: Payer: 59 | Admitting: Student

## 2023-02-22 DIAGNOSIS — C7800 Secondary malignant neoplasm of unspecified lung: Secondary | ICD-10-CM | POA: Diagnosis not present

## 2023-02-22 DIAGNOSIS — G931 Anoxic brain damage, not elsewhere classified: Secondary | ICD-10-CM | POA: Diagnosis not present

## 2023-02-22 DIAGNOSIS — C159 Malignant neoplasm of esophagus, unspecified: Secondary | ICD-10-CM | POA: Diagnosis not present

## 2023-02-22 DIAGNOSIS — R918 Other nonspecific abnormal finding of lung field: Secondary | ICD-10-CM | POA: Diagnosis not present

## 2023-02-22 DIAGNOSIS — G894 Chronic pain syndrome: Secondary | ICD-10-CM | POA: Diagnosis not present

## 2023-02-22 DIAGNOSIS — K219 Gastro-esophageal reflux disease without esophagitis: Secondary | ICD-10-CM | POA: Diagnosis not present

## 2023-02-22 DIAGNOSIS — M1990 Unspecified osteoarthritis, unspecified site: Secondary | ICD-10-CM | POA: Diagnosis not present

## 2023-02-22 DIAGNOSIS — J449 Chronic obstructive pulmonary disease, unspecified: Secondary | ICD-10-CM | POA: Diagnosis not present

## 2023-02-22 DIAGNOSIS — J439 Emphysema, unspecified: Secondary | ICD-10-CM | POA: Diagnosis not present

## 2023-02-22 DIAGNOSIS — I739 Peripheral vascular disease, unspecified: Secondary | ICD-10-CM | POA: Diagnosis not present

## 2023-02-22 DIAGNOSIS — I493 Ventricular premature depolarization: Secondary | ICD-10-CM | POA: Diagnosis not present

## 2023-02-22 DIAGNOSIS — I959 Hypotension, unspecified: Secondary | ICD-10-CM | POA: Diagnosis not present

## 2023-02-22 DIAGNOSIS — E872 Acidosis, unspecified: Secondary | ICD-10-CM | POA: Diagnosis not present

## 2023-02-22 DIAGNOSIS — D630 Anemia in neoplastic disease: Secondary | ICD-10-CM | POA: Diagnosis not present

## 2023-02-22 DIAGNOSIS — N179 Acute kidney failure, unspecified: Secondary | ICD-10-CM | POA: Diagnosis not present

## 2023-02-22 DIAGNOSIS — J690 Pneumonitis due to inhalation of food and vomit: Secondary | ICD-10-CM | POA: Diagnosis not present

## 2023-02-22 DIAGNOSIS — E43 Unspecified severe protein-calorie malnutrition: Secondary | ICD-10-CM | POA: Diagnosis not present

## 2023-02-22 DIAGNOSIS — I10 Essential (primary) hypertension: Secondary | ICD-10-CM | POA: Diagnosis not present

## 2023-02-22 DIAGNOSIS — M069 Rheumatoid arthritis, unspecified: Secondary | ICD-10-CM | POA: Diagnosis not present

## 2023-02-22 DIAGNOSIS — C787 Secondary malignant neoplasm of liver and intrahepatic bile duct: Secondary | ICD-10-CM | POA: Diagnosis not present

## 2023-02-22 DIAGNOSIS — E785 Hyperlipidemia, unspecified: Secondary | ICD-10-CM | POA: Diagnosis not present

## 2023-02-22 DIAGNOSIS — I2510 Atherosclerotic heart disease of native coronary artery without angina pectoris: Secondary | ICD-10-CM | POA: Diagnosis not present

## 2023-02-22 DIAGNOSIS — I252 Old myocardial infarction: Secondary | ICD-10-CM | POA: Diagnosis not present

## 2023-02-22 DIAGNOSIS — M5137 Other intervertebral disc degeneration, lumbosacral region: Secondary | ICD-10-CM | POA: Diagnosis not present

## 2023-02-27 ENCOUNTER — Telehealth: Payer: Self-pay | Admitting: Nurse Practitioner

## 2023-02-28 ENCOUNTER — Emergency Department (HOSPITAL_COMMUNITY): Payer: 59

## 2023-02-28 ENCOUNTER — Inpatient Hospital Stay (HOSPITAL_COMMUNITY)
Admission: EM | Admit: 2023-02-28 | Discharge: 2023-03-07 | DRG: 871 | Disposition: A | Discharge status: Home Health | Payer: 59 | Attending: Internal Medicine | Admitting: Internal Medicine

## 2023-02-28 DIAGNOSIS — E43 Unspecified severe protein-calorie malnutrition: Secondary | ICD-10-CM | POA: Diagnosis not present

## 2023-02-28 DIAGNOSIS — R918 Other nonspecific abnormal finding of lung field: Secondary | ICD-10-CM | POA: Diagnosis not present

## 2023-02-28 DIAGNOSIS — D630 Anemia in neoplastic disease: Secondary | ICD-10-CM | POA: Diagnosis not present

## 2023-02-28 DIAGNOSIS — D509 Iron deficiency anemia, unspecified: Secondary | ICD-10-CM | POA: Diagnosis not present

## 2023-02-28 DIAGNOSIS — Z515 Encounter for palliative care: Secondary | ICD-10-CM | POA: Diagnosis not present

## 2023-02-28 DIAGNOSIS — M069 Rheumatoid arthritis, unspecified: Secondary | ICD-10-CM | POA: Diagnosis not present

## 2023-02-28 DIAGNOSIS — B9689 Other specified bacterial agents as the cause of diseases classified elsewhere: Secondary | ICD-10-CM | POA: Diagnosis present

## 2023-02-28 DIAGNOSIS — S0990XA Unspecified injury of head, initial encounter: Secondary | ICD-10-CM

## 2023-02-28 DIAGNOSIS — Y92009 Unspecified place in unspecified non-institutional (private) residence as the place of occurrence of the external cause: Secondary | ICD-10-CM | POA: Diagnosis not present

## 2023-02-28 DIAGNOSIS — L89156 Pressure-induced deep tissue damage of sacral region: Secondary | ICD-10-CM | POA: Diagnosis present

## 2023-02-28 DIAGNOSIS — K769 Liver disease, unspecified: Secondary | ICD-10-CM | POA: Diagnosis not present

## 2023-02-28 DIAGNOSIS — I2510 Atherosclerotic heart disease of native coronary artery without angina pectoris: Secondary | ICD-10-CM | POA: Diagnosis present

## 2023-02-28 DIAGNOSIS — K861 Other chronic pancreatitis: Secondary | ICD-10-CM | POA: Diagnosis present

## 2023-02-28 DIAGNOSIS — E785 Hyperlipidemia, unspecified: Secondary | ICD-10-CM | POA: Diagnosis not present

## 2023-02-28 DIAGNOSIS — L89616 Pressure-induced deep tissue damage of right heel: Secondary | ICD-10-CM | POA: Diagnosis present

## 2023-02-28 DIAGNOSIS — Z885 Allergy status to narcotic agent status: Secondary | ICD-10-CM

## 2023-02-28 DIAGNOSIS — Z8674 Personal history of sudden cardiac arrest: Secondary | ICD-10-CM

## 2023-02-28 DIAGNOSIS — M6250 Muscle wasting and atrophy, not elsewhere classified, unspecified site: Secondary | ICD-10-CM | POA: Diagnosis present

## 2023-02-28 DIAGNOSIS — Z86711 Personal history of pulmonary embolism: Secondary | ICD-10-CM

## 2023-02-28 DIAGNOSIS — Z79899 Other long term (current) drug therapy: Secondary | ICD-10-CM

## 2023-02-28 DIAGNOSIS — I10 Essential (primary) hypertension: Secondary | ICD-10-CM | POA: Diagnosis not present

## 2023-02-28 DIAGNOSIS — J439 Emphysema, unspecified: Secondary | ICD-10-CM | POA: Diagnosis not present

## 2023-02-28 DIAGNOSIS — R188 Other ascites: Secondary | ICD-10-CM | POA: Diagnosis not present

## 2023-02-28 DIAGNOSIS — M549 Dorsalgia, unspecified: Secondary | ICD-10-CM | POA: Diagnosis not present

## 2023-02-28 DIAGNOSIS — J9601 Acute respiratory failure with hypoxia: Secondary | ICD-10-CM | POA: Diagnosis present

## 2023-02-28 DIAGNOSIS — I672 Cerebral atherosclerosis: Secondary | ICD-10-CM | POA: Diagnosis not present

## 2023-02-28 DIAGNOSIS — I959 Hypotension, unspecified: Secondary | ICD-10-CM | POA: Diagnosis not present

## 2023-02-28 DIAGNOSIS — R160 Hepatomegaly, not elsewhere classified: Secondary | ICD-10-CM | POA: Diagnosis not present

## 2023-02-28 DIAGNOSIS — R6521 Severe sepsis with septic shock: Secondary | ICD-10-CM | POA: Diagnosis present

## 2023-02-28 DIAGNOSIS — R579 Shock, unspecified: Principal | ICD-10-CM | POA: Diagnosis present

## 2023-02-28 DIAGNOSIS — I50812 Chronic right heart failure: Secondary | ICD-10-CM | POA: Diagnosis not present

## 2023-02-28 DIAGNOSIS — W19XXXA Unspecified fall, initial encounter: Secondary | ICD-10-CM

## 2023-02-28 DIAGNOSIS — E872 Acidosis, unspecified: Secondary | ICD-10-CM | POA: Diagnosis present

## 2023-02-28 DIAGNOSIS — I499 Cardiac arrhythmia, unspecified: Secondary | ICD-10-CM | POA: Diagnosis not present

## 2023-02-28 DIAGNOSIS — I110 Hypertensive heart disease with heart failure: Secondary | ICD-10-CM | POA: Diagnosis not present

## 2023-02-28 DIAGNOSIS — D378 Neoplasm of uncertain behavior of other specified digestive organs: Secondary | ICD-10-CM | POA: Diagnosis not present

## 2023-02-28 DIAGNOSIS — M5137 Other intervertebral disc degeneration, lumbosacral region: Secondary | ICD-10-CM | POA: Diagnosis not present

## 2023-02-28 DIAGNOSIS — J440 Chronic obstructive pulmonary disease with acute lower respiratory infection: Secondary | ICD-10-CM | POA: Diagnosis not present

## 2023-02-28 DIAGNOSIS — Z888 Allergy status to other drugs, medicaments and biological substances status: Secondary | ICD-10-CM

## 2023-02-28 DIAGNOSIS — L8990 Pressure ulcer of unspecified site, unspecified stage: Secondary | ICD-10-CM | POA: Insufficient documentation

## 2023-02-28 DIAGNOSIS — R590 Localized enlarged lymph nodes: Secondary | ICD-10-CM | POA: Diagnosis not present

## 2023-02-28 DIAGNOSIS — W06XXXA Fall from bed, initial encounter: Secondary | ICD-10-CM | POA: Diagnosis present

## 2023-02-28 DIAGNOSIS — Z1152 Encounter for screening for COVID-19: Secondary | ICD-10-CM

## 2023-02-28 DIAGNOSIS — E876 Hypokalemia: Secondary | ICD-10-CM | POA: Diagnosis present

## 2023-02-28 DIAGNOSIS — C7800 Secondary malignant neoplasm of unspecified lung: Secondary | ICD-10-CM | POA: Diagnosis not present

## 2023-02-28 DIAGNOSIS — B961 Klebsiella pneumoniae [K. pneumoniae] as the cause of diseases classified elsewhere: Secondary | ICD-10-CM | POA: Diagnosis present

## 2023-02-28 DIAGNOSIS — M546 Pain in thoracic spine: Secondary | ICD-10-CM

## 2023-02-28 DIAGNOSIS — R7881 Bacteremia: Secondary | ICD-10-CM

## 2023-02-28 DIAGNOSIS — Z5982 Transportation insecurity: Secondary | ICD-10-CM

## 2023-02-28 DIAGNOSIS — I50813 Acute on chronic right heart failure: Secondary | ICD-10-CM | POA: Diagnosis not present

## 2023-02-28 DIAGNOSIS — I4891 Unspecified atrial fibrillation: Secondary | ICD-10-CM | POA: Diagnosis present

## 2023-02-28 DIAGNOSIS — A419 Sepsis, unspecified organism: Secondary | ICD-10-CM | POA: Diagnosis not present

## 2023-02-28 DIAGNOSIS — Z8616 Personal history of COVID-19: Secondary | ICD-10-CM | POA: Diagnosis not present

## 2023-02-28 DIAGNOSIS — I493 Ventricular premature depolarization: Secondary | ICD-10-CM | POA: Diagnosis not present

## 2023-02-28 DIAGNOSIS — J690 Pneumonitis due to inhalation of food and vomit: Secondary | ICD-10-CM | POA: Diagnosis not present

## 2023-02-28 DIAGNOSIS — I429 Cardiomyopathy, unspecified: Secondary | ICD-10-CM | POA: Diagnosis present

## 2023-02-28 DIAGNOSIS — R0689 Other abnormalities of breathing: Secondary | ICD-10-CM | POA: Diagnosis not present

## 2023-02-28 DIAGNOSIS — C159 Malignant neoplasm of esophagus, unspecified: Secondary | ICD-10-CM | POA: Diagnosis not present

## 2023-02-28 DIAGNOSIS — G8929 Other chronic pain: Secondary | ICD-10-CM | POA: Diagnosis present

## 2023-02-28 DIAGNOSIS — C772 Secondary and unspecified malignant neoplasm of intra-abdominal lymph nodes: Secondary | ICD-10-CM | POA: Diagnosis present

## 2023-02-28 DIAGNOSIS — Z89612 Acquired absence of left leg above knee: Secondary | ICD-10-CM | POA: Diagnosis not present

## 2023-02-28 DIAGNOSIS — I2781 Cor pulmonale (chronic): Secondary | ICD-10-CM | POA: Diagnosis present

## 2023-02-28 DIAGNOSIS — Z7951 Long term (current) use of inhaled steroids: Secondary | ICD-10-CM

## 2023-02-28 DIAGNOSIS — I2729 Other secondary pulmonary hypertension: Secondary | ICD-10-CM | POA: Diagnosis not present

## 2023-02-28 DIAGNOSIS — M1990 Unspecified osteoarthritis, unspecified site: Secondary | ICD-10-CM | POA: Diagnosis not present

## 2023-02-28 DIAGNOSIS — C787 Secondary malignant neoplasm of liver and intrahepatic bile duct: Secondary | ICD-10-CM | POA: Diagnosis not present

## 2023-02-28 DIAGNOSIS — Z7189 Other specified counseling: Secondary | ICD-10-CM | POA: Diagnosis not present

## 2023-02-28 DIAGNOSIS — Z8673 Personal history of transient ischemic attack (TIA), and cerebral infarction without residual deficits: Secondary | ICD-10-CM

## 2023-02-28 DIAGNOSIS — K863 Pseudocyst of pancreas: Secondary | ICD-10-CM | POA: Diagnosis present

## 2023-02-28 DIAGNOSIS — K831 Obstruction of bile duct: Secondary | ICD-10-CM | POA: Diagnosis not present

## 2023-02-28 DIAGNOSIS — G9341 Metabolic encephalopathy: Secondary | ICD-10-CM | POA: Diagnosis present

## 2023-02-28 DIAGNOSIS — R578 Other shock: Secondary | ICD-10-CM | POA: Diagnosis not present

## 2023-02-28 DIAGNOSIS — I252 Old myocardial infarction: Secondary | ICD-10-CM

## 2023-02-28 DIAGNOSIS — G894 Chronic pain syndrome: Secondary | ICD-10-CM | POA: Diagnosis not present

## 2023-02-28 DIAGNOSIS — Z87891 Personal history of nicotine dependence: Secondary | ICD-10-CM

## 2023-02-28 DIAGNOSIS — K219 Gastro-esophageal reflux disease without esophagitis: Secondary | ICD-10-CM | POA: Diagnosis present

## 2023-02-28 DIAGNOSIS — R54 Age-related physical debility: Secondary | ICD-10-CM | POA: Diagnosis present

## 2023-02-28 DIAGNOSIS — I517 Cardiomegaly: Secondary | ICD-10-CM | POA: Diagnosis not present

## 2023-02-28 DIAGNOSIS — R001 Bradycardia, unspecified: Secondary | ICD-10-CM | POA: Diagnosis present

## 2023-02-28 DIAGNOSIS — D61818 Other pancytopenia: Secondary | ICD-10-CM | POA: Diagnosis not present

## 2023-02-28 DIAGNOSIS — A4159 Other Gram-negative sepsis: Principal | ICD-10-CM | POA: Diagnosis present

## 2023-02-28 DIAGNOSIS — I6523 Occlusion and stenosis of bilateral carotid arteries: Secondary | ICD-10-CM | POA: Diagnosis not present

## 2023-02-28 DIAGNOSIS — Z79631 Long term (current) use of antimetabolite agent: Secondary | ICD-10-CM

## 2023-02-28 DIAGNOSIS — Z86718 Personal history of other venous thrombosis and embolism: Secondary | ICD-10-CM

## 2023-02-28 DIAGNOSIS — Z7901 Long term (current) use of anticoagulants: Secondary | ICD-10-CM

## 2023-02-28 DIAGNOSIS — S199XXA Unspecified injury of neck, initial encounter: Secondary | ICD-10-CM | POA: Diagnosis not present

## 2023-02-28 DIAGNOSIS — Z8249 Family history of ischemic heart disease and other diseases of the circulatory system: Secondary | ICD-10-CM

## 2023-02-28 DIAGNOSIS — N179 Acute kidney failure, unspecified: Secondary | ICD-10-CM | POA: Diagnosis not present

## 2023-02-28 DIAGNOSIS — G931 Anoxic brain damage, not elsewhere classified: Secondary | ICD-10-CM | POA: Diagnosis not present

## 2023-02-28 DIAGNOSIS — Z743 Need for continuous supervision: Secondary | ICD-10-CM | POA: Diagnosis not present

## 2023-02-28 DIAGNOSIS — Z79891 Long term (current) use of opiate analgesic: Secondary | ICD-10-CM

## 2023-02-28 DIAGNOSIS — J449 Chronic obstructive pulmonary disease, unspecified: Secondary | ICD-10-CM | POA: Diagnosis not present

## 2023-02-28 DIAGNOSIS — C160 Malignant neoplasm of cardia: Secondary | ICD-10-CM | POA: Diagnosis not present

## 2023-02-28 DIAGNOSIS — Z7902 Long term (current) use of antithrombotics/antiplatelets: Secondary | ICD-10-CM

## 2023-02-28 DIAGNOSIS — I700 Atherosclerosis of aorta: Secondary | ICD-10-CM | POA: Diagnosis not present

## 2023-02-28 DIAGNOSIS — I739 Peripheral vascular disease, unspecified: Secondary | ICD-10-CM | POA: Diagnosis not present

## 2023-02-28 DIAGNOSIS — Z8701 Personal history of pneumonia (recurrent): Secondary | ICD-10-CM

## 2023-02-28 DIAGNOSIS — R739 Hyperglycemia, unspecified: Secondary | ICD-10-CM | POA: Diagnosis present

## 2023-02-28 LAB — CBC WITH DIFFERENTIAL/PLATELET
Abs Immature Granulocytes: 0.05 10*3/uL (ref 0.00–0.07)
Basophils Absolute: 0.1 10*3/uL (ref 0.0–0.1)
Basophils Relative: 1 %
Eosinophils Absolute: 0.1 10*3/uL (ref 0.0–0.5)
Eosinophils Relative: 1 %
HCT: 34.6 % — ABNORMAL LOW (ref 39.0–52.0)
Hemoglobin: 10.5 g/dL — ABNORMAL LOW (ref 13.0–17.0)
Immature Granulocytes: 1 %
Lymphocytes Relative: 15 %
Lymphs Abs: 1.5 10*3/uL (ref 0.7–4.0)
MCH: 27.7 pg (ref 26.0–34.0)
MCHC: 30.3 g/dL (ref 30.0–36.0)
MCV: 91.3 fL (ref 80.0–100.0)
Monocytes Absolute: 0.9 10*3/uL (ref 0.1–1.0)
Monocytes Relative: 9 %
Neutro Abs: 7.4 10*3/uL (ref 1.7–7.7)
Neutrophils Relative %: 73 %
Platelets: 121 10*3/uL — ABNORMAL LOW (ref 150–400)
RBC: 3.79 MIL/uL — ABNORMAL LOW (ref 4.22–5.81)
RDW: 17.4 % — ABNORMAL HIGH (ref 11.5–15.5)
WBC: 9.9 10*3/uL (ref 4.0–10.5)
nRBC: 0 % (ref 0.0–0.2)

## 2023-02-28 LAB — I-STAT ARTERIAL BLOOD GAS, ED
Acid-base deficit: 9 mmol/L — ABNORMAL HIGH (ref 0.0–2.0)
Bicarbonate: 16.1 mmol/L — ABNORMAL LOW (ref 20.0–28.0)
Calcium, Ion: 1.13 mmol/L — ABNORMAL LOW (ref 1.15–1.40)
HCT: 32 % — ABNORMAL LOW (ref 39.0–52.0)
Hemoglobin: 10.9 g/dL — ABNORMAL LOW (ref 13.0–17.0)
O2 Saturation: 100 %
Patient temperature: 100.7
Potassium: 4.5 mmol/L (ref 3.5–5.1)
Sodium: 141 mmol/L (ref 135–145)
TCO2: 17 mmol/L — ABNORMAL LOW (ref 22–32)
pCO2 arterial: 33.2 mmHg (ref 32–48)
pH, Arterial: 7.298 — ABNORMAL LOW (ref 7.35–7.45)
pO2, Arterial: 412 mmHg — ABNORMAL HIGH (ref 83–108)

## 2023-02-28 LAB — HEMOGLOBIN A1C
Hgb A1c MFr Bld: 5 % (ref 4.8–5.6)
Mean Plasma Glucose: 96.8 mg/dL

## 2023-02-28 LAB — I-STAT CHEM 8, ED
BUN: 29 mg/dL — ABNORMAL HIGH (ref 8–23)
Calcium, Ion: 1.09 mmol/L — ABNORMAL LOW (ref 1.15–1.40)
Chloride: 110 mmol/L (ref 98–111)
Creatinine, Ser: 1.9 mg/dL — ABNORMAL HIGH (ref 0.61–1.24)
Glucose, Bld: 134 mg/dL — ABNORMAL HIGH (ref 70–99)
HCT: 34 % — ABNORMAL LOW (ref 39.0–52.0)
Hemoglobin: 11.6 g/dL — ABNORMAL LOW (ref 13.0–17.0)
Potassium: 4.9 mmol/L (ref 3.5–5.1)
Sodium: 141 mmol/L (ref 135–145)
TCO2: 17 mmol/L — ABNORMAL LOW (ref 22–32)

## 2023-02-28 LAB — COMPREHENSIVE METABOLIC PANEL
ALT: 41 U/L (ref 0–44)
AST: 68 U/L — ABNORMAL HIGH (ref 15–41)
Albumin: 2.2 g/dL — ABNORMAL LOW (ref 3.5–5.0)
Alkaline Phosphatase: 265 U/L — ABNORMAL HIGH (ref 38–126)
Anion gap: 7 (ref 5–15)
BUN: 29 mg/dL — ABNORMAL HIGH (ref 8–23)
CO2: 19 mmol/L — ABNORMAL LOW (ref 22–32)
Calcium: 7.3 mg/dL — ABNORMAL LOW (ref 8.9–10.3)
Chloride: 111 mmol/L (ref 98–111)
Creatinine, Ser: 1.95 mg/dL — ABNORMAL HIGH (ref 0.61–1.24)
GFR, Estimated: 38 mL/min — ABNORMAL LOW (ref >60)
Glucose, Bld: 150 mg/dL — ABNORMAL HIGH (ref 70–99)
Potassium: 4 mmol/L (ref 3.5–5.1)
Sodium: 137 mmol/L (ref 135–145)
Total Bilirubin: 0.5 mg/dL (ref 0.3–1.2)
Total Protein: 6.2 g/dL — ABNORMAL LOW (ref 6.5–8.1)

## 2023-02-28 LAB — URINALYSIS, W/ REFLEX TO CULTURE (INFECTION SUSPECTED)
Glucose, UA: NEGATIVE mg/dL
Hgb urine dipstick: NEGATIVE
Ketones, ur: NEGATIVE mg/dL
Leukocytes,Ua: NEGATIVE
Nitrite: NEGATIVE
Protein, ur: NEGATIVE mg/dL
Specific Gravity, Urine: 1.036 — ABNORMAL HIGH (ref 1.005–1.030)
pH: 5 (ref 5.0–8.0)

## 2023-02-28 LAB — MRSA NEXT GEN BY PCR, NASAL: MRSA by PCR Next Gen: NOT DETECTED

## 2023-02-28 LAB — RESP PANEL BY RT-PCR (RSV, FLU A&B, COVID)  RVPGX2
Influenza A by PCR: NEGATIVE
Influenza B by PCR: NEGATIVE
Resp Syncytial Virus by PCR: NEGATIVE
SARS Coronavirus 2 by RT PCR: NEGATIVE

## 2023-02-28 LAB — PROCALCITONIN: Procalcitonin: 0.72 ng/mL

## 2023-02-28 LAB — I-STAT CG4 LACTIC ACID, ED
Lactic Acid, Venous: 2 mmol/L (ref 0.5–1.9)
Lactic Acid, Venous: 2.6 mmol/L (ref 0.5–1.9)

## 2023-02-28 LAB — GLUCOSE, CAPILLARY: Glucose-Capillary: 111 mg/dL — ABNORMAL HIGH (ref 70–99)

## 2023-02-28 LAB — GAMMA GT: GGT: 187 U/L — ABNORMAL HIGH (ref 7–50)

## 2023-02-28 LAB — MAGNESIUM: Magnesium: 1.3 mg/dL — ABNORMAL LOW (ref 1.7–2.4)

## 2023-02-28 LAB — BRAIN NATRIURETIC PEPTIDE: B Natriuretic Peptide: 405.6 pg/mL — ABNORMAL HIGH (ref 0.0–100.0)

## 2023-02-28 LAB — CORTISOL: Cortisol, Plasma: 20.1 ug/dL

## 2023-02-28 MED ORDER — IOHEXOL 350 MG/ML SOLN
75.0000 mL | Freq: Once | INTRAVENOUS | Status: AC | PRN
  Administered 2023-02-28: 75 mL via INTRAVENOUS

## 2023-02-28 MED ORDER — ONDANSETRON HCL 4 MG/2ML IJ SOLN
4.0000 mg | Freq: Four times a day (QID) | INTRAMUSCULAR | Status: DC | PRN
  Administered 2023-03-01 – 2023-03-03 (×2): 4 mg via INTRAVENOUS
  Filled 2023-02-28 (×2): qty 2

## 2023-02-28 MED ORDER — CHLORHEXIDINE GLUCONATE CLOTH 2 % EX PADS
6.0000 | MEDICATED_PAD | Freq: Every day | CUTANEOUS | Status: DC
  Administered 2023-02-28 – 2023-03-07 (×8): 6 via TOPICAL

## 2023-02-28 MED ORDER — METRONIDAZOLE 500 MG/100ML IV SOLN
500.0000 mg | Freq: Once | INTRAVENOUS | Status: AC
  Administered 2023-02-28: 500 mg via INTRAVENOUS
  Filled 2023-02-28: qty 100

## 2023-02-28 MED ORDER — HEPARIN SODIUM (PORCINE) 5000 UNIT/ML IJ SOLN
5000.0000 [IU] | Freq: Three times a day (TID) | INTRAMUSCULAR | Status: DC
  Administered 2023-02-28 – 2023-03-03 (×8): 5000 [IU] via SUBCUTANEOUS
  Filled 2023-02-28 (×8): qty 1

## 2023-02-28 MED ORDER — METRONIDAZOLE 500 MG/100ML IV SOLN
500.0000 mg | Freq: Two times a day (BID) | INTRAVENOUS | Status: DC
  Administered 2023-03-01 – 2023-03-02 (×3): 500 mg via INTRAVENOUS
  Filled 2023-02-28 (×3): qty 100

## 2023-02-28 MED ORDER — VANCOMYCIN VARIABLE DOSE PER UNSTABLE RENAL FUNCTION (PHARMACIST DOSING): Status: DC

## 2023-02-28 MED ORDER — LACTATED RINGERS IV BOLUS (SEPSIS)
1000.0000 mL | Freq: Once | INTRAVENOUS | Status: AC
  Administered 2023-02-28: 1000 mL via INTRAVENOUS

## 2023-02-28 MED ORDER — AMIODARONE HCL 200 MG PO TABS: 200.0000 mg | ORAL_TABLET | Freq: Two times a day (BID) | ORAL | Status: DC

## 2023-02-28 MED ORDER — CLOPIDOGREL BISULFATE 75 MG PO TABS
75.0000 mg | ORAL_TABLET | Freq: Every day | ORAL | Status: DC
  Administered 2023-03-01 – 2023-03-07 (×7): 75 mg via ORAL
  Filled 2023-02-28 (×7): qty 1

## 2023-02-28 MED ORDER — DULOXETINE HCL 30 MG PO CPEP
30.0000 mg | ORAL_CAPSULE | Freq: Every day | ORAL | Status: DC | PRN
  Administered 2023-03-02: 30 mg via ORAL
  Filled 2023-02-28 (×2): qty 1

## 2023-02-28 MED ORDER — POLYETHYLENE GLYCOL 3350 17 G PO PACK: 17.0000 g | PACK | Freq: Every day | ORAL | Status: DC | PRN

## 2023-02-28 MED ORDER — CALCIUM GLUCONATE-NACL 2-0.675 GM/100ML-% IV SOLN
2.0000 g | Freq: Once | INTRAVENOUS | Status: AC
  Administered 2023-03-01: 2000 mg via INTRAVENOUS
  Filled 2023-02-28: qty 100

## 2023-02-28 MED ORDER — SODIUM CHLORIDE 0.9% FLUSH
10.0000 mL | Freq: Two times a day (BID) | INTRAVENOUS | Status: DC
  Administered 2023-03-01 – 2023-03-07 (×11): 10 mL

## 2023-02-28 MED ORDER — VANCOMYCIN HCL IN DEXTROSE 1-5 GM/200ML-% IV SOLN
1000.0000 mg | Freq: Once | INTRAVENOUS | Status: AC
  Administered 2023-02-28: 1000 mg via INTRAVENOUS
  Filled 2023-02-28: qty 200

## 2023-02-28 MED ORDER — LIDOCAINE-PRILOCAINE 2.5-2.5 % EX CREA
1.0000 | TOPICAL_CREAM | CUTANEOUS | Status: DC
  Filled 2023-02-28: qty 5

## 2023-02-28 MED ORDER — SODIUM CHLORIDE 0.9 % IV SOLN
2.0000 g | Freq: Once | INTRAVENOUS | Status: AC
  Administered 2023-02-28: 2 g via INTRAVENOUS
  Filled 2023-02-28: qty 12.5

## 2023-02-28 MED ORDER — CALCIUM GLUCONATE-NACL 1-0.675 GM/50ML-% IV SOLN
1.0000 g | Freq: Once | INTRAVENOUS | Status: AC
  Administered 2023-02-28: 1000 mg via INTRAVENOUS
  Filled 2023-02-28: qty 50

## 2023-02-28 MED ORDER — SODIUM BICARBONATE 8.4 % IV SOLN
100.0000 meq | Freq: Once | INTRAVENOUS | Status: AC
  Administered 2023-02-28: 100 meq via INTRAVENOUS
  Filled 2023-02-28: qty 50

## 2023-02-28 MED ORDER — MOMETASONE FURO-FORMOTEROL FUM 100-5 MCG/ACT IN AERO
2.0000 | INHALATION_SPRAY | Freq: Two times a day (BID) | RESPIRATORY_TRACT | Status: DC
  Administered 2023-03-01 – 2023-03-07 (×7): 2 via RESPIRATORY_TRACT
  Filled 2023-02-28: qty 8.8

## 2023-02-28 MED ORDER — LACTATED RINGERS IV BOLUS
500.0000 mL | Freq: Once | INTRAVENOUS | Status: AC
  Administered 2023-02-28: 500 mL via INTRAVENOUS

## 2023-02-28 MED ORDER — SODIUM CHLORIDE 0.9 % IV SOLN
2.0000 g | Freq: Two times a day (BID) | INTRAVENOUS | Status: DC
  Administered 2023-03-01: 2 g via INTRAVENOUS
  Filled 2023-02-28: qty 12.5

## 2023-02-28 MED ORDER — INSULIN ASPART 100 UNIT/ML IJ SOLN
0.0000 [IU] | Freq: Three times a day (TID) | INTRAMUSCULAR | Status: DC
  Administered 2023-03-01 (×3): 1 [IU] via SUBCUTANEOUS

## 2023-02-28 MED ORDER — PROCHLORPERAZINE MALEATE 10 MG PO TABS: 10.0000 mg | ORAL_TABLET | Freq: Four times a day (QID) | ORAL | Status: DC | PRN

## 2023-02-28 MED ORDER — ALBUTEROL SULFATE (2.5 MG/3ML) 0.083% IN NEBU: 3.0000 mL | INHALATION_SOLUTION | Freq: Four times a day (QID) | RESPIRATORY_TRACT | Status: DC | PRN

## 2023-02-28 MED ORDER — LACTATED RINGERS IV SOLN: INTRAVENOUS | Status: AC

## 2023-02-28 MED ORDER — DOCUSATE SODIUM 100 MG PO CAPS: 100.0000 mg | ORAL_CAPSULE | Freq: Two times a day (BID) | ORAL | Status: DC | PRN

## 2023-02-28 MED ORDER — SODIUM CHLORIDE 0.9% FLUSH
10.0000 mL | INTRAVENOUS | Status: DC | PRN
  Administered 2023-03-07: 10 mL

## 2023-02-28 MED ORDER — ACETAMINOPHEN 500 MG PO TABS
1000.0000 mg | ORAL_TABLET | ORAL | Status: AC
  Administered 2023-02-28: 1000 mg via ORAL
  Filled 2023-02-28: qty 2

## 2023-02-28 MED ORDER — FERROUS SULFATE 325 (65 FE) MG PO TABS
325.0000 mg | ORAL_TABLET | Freq: Every day | ORAL | Status: DC
  Administered 2023-03-01 – 2023-03-07 (×7): 325 mg via ORAL
  Filled 2023-02-28 (×7): qty 1

## 2023-02-28 MED ORDER — OXYCODONE HCL 5 MG PO TABS
10.0000 mg | ORAL_TABLET | Freq: Four times a day (QID) | ORAL | Status: DC
  Administered 2023-02-28 – 2023-03-07 (×29): 10 mg via ORAL
  Filled 2023-02-28 (×29): qty 2

## 2023-02-28 MED ORDER — FOLIC ACID 1 MG PO TABS
1.0000 mg | ORAL_TABLET | Freq: Every day | ORAL | Status: DC
  Administered 2023-03-01 – 2023-03-07 (×7): 1 mg via ORAL
  Filled 2023-02-28 (×7): qty 1

## 2023-02-28 MED ORDER — NOREPINEPHRINE 4 MG/250ML-% IV SOLN
0.0000 ug/min | INTRAVENOUS | Status: DC
  Administered 2023-02-28: 2 ug/min via INTRAVENOUS
  Administered 2023-03-01: 14 ug/min via INTRAVENOUS
  Administered 2023-03-01: 15 ug/min via INTRAVENOUS
  Administered 2023-03-01: 10 ug/min via INTRAVENOUS
  Administered 2023-03-01: 9 ug/min via INTRAVENOUS
  Administered 2023-03-03: 2 ug/min via INTRAVENOUS
  Filled 2023-02-28 (×6): qty 250

## 2023-02-28 MED ORDER — SODIUM CHLORIDE 0.9 % IV SOLN
250.0000 mL | INTRAVENOUS | Status: DC
  Administered 2023-02-28: 250 mL via INTRAVENOUS

## 2023-02-28 NOTE — Progress Notes (Signed)
A STAT consult was placed to IV Therapy to access the patient's Portacath; upon arrival, pt was not in the room;  per staff, he is "in CT";  will have staff update consult when pt is back and ready for the port to be accessed.

## 2023-02-28 NOTE — Progress Notes (Deleted)
Palliative Medicine [facility]  [phone] [phone]   Name: [name] [name] Date: 02/28/2023 MRN: [id number]  DOB: 01-14-1961  Patient Care Team: Rocky Morel, DO as PCP - General Ladona Ridgel Doylene Canning, MD as PCP - Electrophysiology (Cardiology) Rollene Rotunda, MD as PCP - Cardiology (Cardiology) Ewing Schlein, MD as Referring Physician (Pain Medicine) Nada Libman, MD as Consulting Physician (Vascular Surgery) Sallye Lat, MD as Consulting Physician (Ophthalmology) Jerrell Mylar, Jacquelyne Balint, RN as Oncology Nurse Navigator Ladene Artist, MD as Consulting Physician (Oncology)    REASON FOR CONSULTATION: [name] [name] is a 62 y.o. male with oncologic medical history including metastatic esophageal cancer (02/2022) with metastatic disease to the liver and lymph nodes, as well as a previous history of CAD status post CABG (2020), MI (2023), DVT, COPD, arthritis, and hypertension.  Palliative ask to see for symptom management and goals of care.   SOCIAL HISTORY:     reports that he quit smoking about 4 years ago. His smoking use included cigarettes. He started smoking about 24 years ago. He has a 5 pack-year smoking history. He has never used smokeless tobacco. He reports that he does not drink alcohol and does not use drugs.  ADVANCE DIRECTIVES:  ***  CODE STATUS: {Palliative Code status:23503}  PAST MEDICAL HISTORY: Past Medical History:  Diagnosis Date  . Acute respiratory failure (HCC) 01/31/2022  . Bilateral shoulder pain 08/20/2012  . Bite wound of left hand 09/27/2021  . CAD (coronary artery disease)    Occluded nondominent RCA managed medically - August 2023  . Cardiac arrest [facility])    [facility] V[facility] - August 2023  . Community acquired pneumonia 11/07/2011  . Critical limb ischemia with history of revascularization of same extremity (HCC) 04/28/2020  . DDD (degenerative disc disease), lumbosacral     and grade 2 slip  . DVT (deep  venous thrombosis) (HCC) 01/31/2022  . Esophageal cancer (HCC)    Adenocarcinoma with metastasis  . GERD (gastroesophageal reflux disease)   . History of anemia   . History of COVID-19 05/10/2019  . History of critical lower limb ischemia 02/19/2019  . Hypertension   . Impingement syndrome of shoulder region   . Ischemic ulcer of toe of left foot (HCC)   . Liver mass   . Low back pain without sciatica   . Lupus (HCC)   . Multiple fractures of ribs, bilateral, due to CPR trauma   . Onychomycosis   . Paronychia of great toe   . Peripheral vascular disease (HCC)   . Pressure injury of skin   . Pulmonary embolus (HCC)   . RA (rheumatoid arthritis) (HCC)   . Rotator cuff tear 11/04/2014  . Sternal fracture   . Stroke [facility])     PAST SURGICAL HISTORY:  Past Surgical History:  Procedure Laterality Date  . ABDOMINAL AORTAGRAM  02/20/2019  . ABDOMINAL AORTOGRAM W/LOWER EXTREMITY N/A 02/20/2019   Procedure: ABDOMINAL AORTOGRAM W/LOWER EXTREMITY;  Surgeon: Cephus Shelling, MD;  [facility] INV[facility] CV LAB;  Service: Cardiovascular;  Laterality: N/A;  . ACHILLES TENDON SURGERY Bilateral   . AMPUTATION Left 05/11/2020   Procedure: LEFT ABOVE KNEE AMPUTATION;  Surgeon: Chuck Hint, MD;  Location: [facility] OR;  Service: Vascular;  Laterality: Left;  . ENDARTERECTOMY POPLITEAL Left 05/01/2020   Procedure: ENDARTERECTOMY POPLITEAL;  Surgeon: Maeola Harman, MD;  Location: [facility] OR;  Service: Vascular;  Laterality: Left;  . FASCIOTOMY Left 05/01/2020   Procedure: FASCIOTOMY;  Surgeon: Maeola Harman, MD;  Location[facility] OR;  Service: Vascular;  Laterality: Left;  . FEMORAL-POPLITEAL BYPASS GRAFT Left 02/26/2019   Procedure: BYPASS GRAFT FEMORAL-POPLITEAL ARTERY LEFT LEG USING 6mm PROPATEN GRAFT;  Surgeon: Nada Libman, MD;  Location: [facility] OR;  Service: Vascular;  Laterality: Left;  . INTRAOPERATIVE ARTERIOGRAM Left 01/22/2020   Procedure: INTRA OPERATIVE ARTERIOGRAM with  administration of thrombolyics in Left femoral to popliteal bypass.;  Surgeon: Cephus Shelling, MD;  Location: [facility] OR;  Service: Vascular;  Laterality: Left;  . IR IMAGING GUIDED PORT INSERTION  03/07/2022  . LEFT HEART CATH AND CORONARY ANGIOGRAPHY N/A 03/15/2017   Procedure: LEFT HEART CATH AND CORONARY ANGIOGRAPHY;  Surgeon: Yvonne Kendall, MD;  [facility] LAB;  Service: Cardiovascular;  Laterality: N/A;  . LEFT HEART CATH AND CORONARY ANGIOGRAPHY N/A 02/06/2022   Procedure: LEFT HEART CATH AND CORONARY ANGIOGRAPHY;  Surgeon: Swaziland, Peter M, MD;  Location: [facility] INVASIVE CV LAB;  Service: Cardiovascular;  Laterality: N/A;  . LOWER EXTREMITY INTERVENTION Left 04/29/2020   Procedure: LOWER EXTREMITY INTERVENTION- LYSIS;  Surgeon: Cephus Shelling, MD;  [facility];  Service: Cardiovascular;  Laterality: Left;  . OSTEOTOMY AND ULNAR SHORTENING Right 07/22/2002  . PATCH ANGIOPLASTY Left 05/01/2020   Procedure: PATCH ANGIOPLASTY USING HEMASHIELD PLATINUM FINESSE PATCH;  Surgeon: Maeola Harman, MD;  Location: [facility] OR;  Service: Vascular;  Laterality: Left;  . PERIPHERAL VASCULAR ATHERECTOMY  01/23/2020   Procedure: PERIPHERAL VASCULAR ATHERECTOMY;  Surgeon: Cephus Shelling, MD;  Location: [facility] INVASIVE CV LAB;  Service: Cardiovascular;;  . PERIPHERAL VASCULAR BALLOON ANGIOPLASTY  01/23/2020   Procedure: PERIPHERAL VASCULAR BALLOON ANGIOPLASTY;  Surgeon: Cephus Shelling, MD;  [facility];  Service: Cardiovascular;;  . PERIPHERAL VASCULAR INTERVENTION Left 04/30/2020   Procedure: PERIPHERAL VASCULAR INTERVENTION;  Surgeon: Leonie Douglas, MD;  [facility];  Service: Cardiovascular;  Laterality: Left;  . PERIPHERAL VASCULAR THROMBECTOMY N/A 01/23/2020   Procedure: lysis recheck;  Surgeon: Cephus Shelling, MD;  Location: [facility] INVASIVE CV LAB;  Service: Cardiovascular;  Laterality: N/A;  + [place]   . PERIPHERAL  VASCULAR THROMBECTOMY N/A 04/30/2020   Procedure: Lysis Recheck;  Surgeon: Leonie Douglas, MD;  [facility];  Service: Cardiovascular;  Laterality: N/A;  . SHOULDER ARTHROSCOPY WITH BICEPSTENOTOMY Right 03/11/2015   Procedure: SHOULDER ARTHROSCOPY WITH BICEPSTENOTOMY;  Surgeon: Loreta Ave, MD;  Location: [facility];  Service: Orthopedics;  Laterality: Right;  . SHOULDER ARTHROSCOPY WITH DISTAL CLAVICLE RESECTION Left 11/19/2014   Procedure: SHOULDER ARTHROSCOPY WITH DISTAL CLAVICLE RESECTION;  Surgeon: Mckinley Jewel, MD;  Location: [facility];  Service: Orthopedics;  Laterality: Left;  . SHOULDER ARTHROSCOPY WITH ROTATOR CUFF REPAIR AND SUBACROMIAL DECOMPRESSION Left 11/19/2014   Procedure: LEFT SHOULDER ARTHROSCOPY, DEBRIDEMENT DISTAL CLAVICLE EXCISION, ACROMIOPLASTY WITH ROTATOR CUFF REPAIR ;  Surgeon: Mckinley Jewel, MD;  Location:  SURGERY CENTER;  Service: Orthopedics;  Laterality: Left;  . THROMBECTOMY OF BYPASS GRAFT FEMORAL- POPLITEAL ARTERY Left 05/01/2020   Procedure: THROMBECTOMY OF LOWER EXTREMITY;  Surgeon: Maeola Harman, MD;  Location: [facility] OR;  Service: Vascular;  Laterality: Left;  . TRANSFORAMINAL LUMBAR INTERBODY FUSION (TLIF) WITH PEDICLE SCREW FIXATION 1 LEVEL N/A 01/03/2018   Procedure: TRANSFORAMINAL LUMBAR INTERBODY FUSION (TLIF) LUMBAR FIVE-SACRAL ONE;  Surgeon: Venita Lick, MD;  Location: [facility] OR;  Service: Orthopedics;  Laterality: N/A;  . ULTRASOUND GUIDANCE FOR VASCULAR ACCESS Right 01/22/2020   Procedure:  ULTRASOUND GUIDANCE FOR VASCULAR ACCESS Right Common Femoral Artery.;  Surgeon: Cephus Shelling, MD;  Location: [facility] OR;  Service: Vascular;  Laterality: Right;  [name] VIDEO ASSISTED THORACOSCOPY (VATS)/DECORTICATION Right 11/21/2011   drainage of empyema    HEMATOLOGY/ONCOLOGY HISTORY:  Oncology History  Adenocarcinoma of unknown origin (HCC)  02/22/2022 Initial Diagnosis   Adenocarcinoma of unknown  origin (HCC)   03/15/2022 - 03/15/2022 Chemotherapy   Patient is on Treatment Plan : COLORECTAL FOLFOX q14d x 3 months     04/06/2022 - 08/16/2022 Chemotherapy   Patient is on Treatment Plan : GASTROESOPHAGEAL FOLFOX D1,15 + Trastuzumab (6/4) D1,15 + Pembrolizumab (200) every 3 weeks, q28d     09/20/2022 -  Chemotherapy   Patient is on Treatment Plan : GASTROESOPHAGEAL Ramucirumab D1, 15 + Paclitaxel D1,8,15 q28d     Esophageal cancer, stage IV (HCC)  03/22/2022 Initial Diagnosis   Cancer of esophagus (HCC)   03/22/2022 Cancer Staging   Staging form: Esophagus - Adenocarcinoma, AJCC 8th Edition - Clinical: Stage IVB (cTX, cNX, pM1) - Signed by Ladene Artist, MD on 03/22/2022   04/06/2022 - 08/16/2022 Chemotherapy   Patient is on Treatment Plan : GASTROESOPHAGEAL FOLFOX D1,15 + Trastuzumab (6/4) D1,15 + Pembrolizumab (200) every 3 weeks, q28d     09/20/2022 -  Chemotherapy   Patient is on Treatment Plan : GASTROESOPHAGEAL Ramucirumab D1, 15 + Paclitaxel D1,8,15 q28d       ALLERGIES:  is allergic to dilaudid [hydromorphone hcl], ramipril, and tramadol.  MEDICATIONS:  Current Outpatient Medications  Medication Sig Dispense Refill  . albuterol (VENTOLIN HFA) 108 (90 Base) MCG/ACT inhaler INHALE 2 PUFFS INTO THE LUNGS EVERY 6 (SIX) HOURS AS NEEDED FOR WHEEZING OR SHORTNESS OF BREATH. 8.5 g 1  . amiodarone (PACERONE) 200 MG tablet Take 1 tablet (200 mg total) by mouth 2 (two) times daily. 60 tablet 11  . amLODipine (NORVASC) 10 MG tablet Take 1 tablet (10 mg total) by mouth daily. 30 tablet 0  . apixaban (ELIQUIS) 5 MG TABS tablet TAKE 1 TABLET (5 MG TOTAL) BY MOUTH 2 (TWO) TIMES DAILY. 60 tablet 2  . atorvastatin (LIPITOR) 40 MG tablet Take 1 tablet (40 mg total) by mouth daily at 6 PM. 30 tablet 0  . clopidogrel (PLAVIX) 75 MG tablet Take 75 mg by mouth daily.    . diclofenac Sodium (VOLTAREN) 1 % GEL Apply 2 g topically 4 (four) times daily. 200 g 0  . DULoxetine (CYMBALTA) 30 MG  capsule Take 30 mg by mouth daily as needed (sleep).    . feeding supplement (ENSURE ENLIVE / ENSURE PLUS) LIQD Take 237 mLs by mouth 3 (three) times daily between meals. 237 mL 12  . ferrous sulfate 325 (65 FE) MG tablet Take 1 tablet (325 mg total) by mouth daily with breakfast for 100 doses. 100 tablet 0  . fluticasone-salmeterol (ADVAIR HFA) 45-21 MCG/ACT inhaler Inhale 2 puffs into the lungs 2 (two) times daily. 1 each 12  . folic acid (FOLVITE) 1 MG tablet Take 1 mg by mouth daily.    [name] gabapentin (NEURONTIN) 300 MG capsule Take 1 capsule (300 mg total) by mouth 2 (two) times daily. Daily or twice daily if tolerated. 60 capsule 3  . isosorbide mononitrate (IMDUR) 30 MG 24 hr tablet Take 0.5 tablets (15 mg total) by mouth daily. 90 tablet 3  . lidocaine-prilocaine (EMLA) cream Apply 1 Application topically as directed. Apply to port site 2 hours prior to stick and cover with Press-and-Seal to  numb port before access 30 g 1  . loperamide (IMODIUM A-D) 2 MG tablet Take 2 mg by mouth 4 (four) times daily as needed for diarrhea or loose stools.    . meloxicam (MOBIC) 7.5 MG tablet Take 1 tablet (7.5 mg total) by mouth daily as needed for pain. 30 tablet 3  . methotrexate (RHEUMATREX) 2.5 MG tablet Take 3 tablets (7.5 mg total) by mouth once a week. Caution:Chemotherapy. Protect from light. 4 tablet 0  . ondansetron (ZOFRAN) 8 MG tablet START TAKING 72 HOURS AFTER CHEMOTHERAPY TREATMENT. 30 tablet 1  . Oxycodone HCl 10 MG TABS Take 10 mg by mouth every 6 (six) hours.    . potassium chloride (KLOR-CON) 10 MEQ tablet TAKE 2 TABLETS(20 MEQ) BY MOUTH DAILY 180 tablet 0  . prochlorperazine (COMPAZINE) 10 MG tablet TAKE 1 TABLET (10 MG TOTAL) BY MOUTH EVERY 6 (SIX) HOURS AS NEEDED. 60 tablet 1   No current facility-administered medications for this visit.    VITAL SIGNS: There were no vitals taken for this visit. There were no vitals filed for this visit.  Estimated body mass index is 20.53 kg/m as  calculated from the following:   Height as of 02/05/23: 5\' 8"  (1.727 m).   Weight as of 02/05/23: 135 lb (61.2 kg).  LABS: CBC:    Component Value Date/Time   WBC 5.4 02/05/2023 1521   HGB 10.5 (L) 02/05/2023 1521   HGB 10.0 (L) 01/08/2023 0942   HGB 11.4 (L) 06/15/2022 1458   HCT 34.8 (L) 02/05/2023 1521   HCT 36.1 (L) 06/15/2022 1458   PLT 77 (L) 02/05/2023 1521   PLT 130 (L) 01/08/2023 0942   PLT 133 (L) 06/15/2022 1458   MCV 92.1 02/05/2023 1521   MCV 84 06/15/2022 1458   NEUTROABS 3.8 02/05/2023 1521   NEUTROABS 2.3 06/15/2022 1458   LYMPHSABS 1.0 02/05/2023 1521   LYMPHSABS 1.1 06/15/2022 1458   MONOABS 0.4 02/05/2023 1521   EOSABS 0.1 02/05/2023 1521   EOSABS 0.2 06/15/2022 1458   BASOSABS 0.0 02/05/2023 1521   BASOSABS 0.1 06/15/2022 1458   Comprehensive Metabolic Panel:    Component Value Date/Time   NA 135 02/05/2023 1521   NA 135 08/16/2022 1524   K 3.6 02/05/2023 1521   CL 110 02/05/2023 1521   CO2 17 (L) 02/05/2023 1521   BUN 15 02/05/2023 1521   BUN 21 08/16/2022 1524   CREATININE 0.98 02/05/2023 1521   CREATININE 0.92 01/08/2023 0942   CREATININE 0.80 11/27/2012 0956   GLUCOSE 102 (H) 02/05/2023 1521   CALCIUM 7.8 (L) 02/05/2023 1521   AST 42 (H) 02/05/2023 1521   AST 26 01/08/2023 0942   ALT 31 02/05/2023 1521   ALT 15 01/08/2023 0942   ALKPHOS 200 (H) 02/05/2023 1521   BILITOT 0.5 02/05/2023 1521   BILITOT 0.4 01/08/2023 0942   PROT 5.7 (L) 02/05/2023 1521   PROT 6.9 06/15/2022 1458   ALBUMIN 2.3 (L) 02/05/2023 1521   ALBUMIN 3.3 (L) 06/15/2022 1458    RADIOGRAPHIC STUDIES:   PERFORMANCE STATUS (ECOG) : {CHL ONC ECOG PS:[id number]}  Review of Systems Unless otherwise noted, a complete review of systems is negative.  Physical Exam General: NAD Cardiovascular: regular rate and rhythm Pulmonary: clear ant fields Abdomen: soft, nontender, + bowel sounds Extremities: no edema, no joint deformities Skin: no  rashes Neurological:  IMPRESSION: *** I introduced myself, Tomoko Sandra RN, and Palliative's role in collaboration with the oncology team. Concept of Palliative Care was introduced as  specialized medical care for people and their families living with serious illness.  It focuses on providing relief from the symptoms and stress of a serious illness.  The goal is to improve quality of life for both the patient and the family. Values and goals of care important to patient and family were attempted to be elicited.    We discussed *** current illness and what it means in the larger context of *** on-going co-morbidities. Natural disease trajectory and expectations were discussed.  I discussed the importance of continued conversation with family and their medical providers regarding overall plan of care and treatment options, ensuring decisions are within the context of the patients values and GOCs.  PLAN: Established therapeutic relationship. Education provided on palliative's role in collaboration with their Oncology/Radiation team. I will plan to see patient back in 2-4 weeks in collaboration to other oncology appointments.    Patient expressed understanding and was in agreement with this plan. He also understands that He can call the clinic at any time with any questions, concerns, or complaints.   Thank you for your referral and allowing Palliative to assist in Mr. Rockwell Zeldin [id number] care.   Number and complexity of problems addressed: ***HIGH - 1 or more chronic illnesses with SEVERE exacerbation, progression, or side effects of treatment - advanced cancer, pain. Any controlled substances utilized were prescribed in the context of palliative care.   Visit consisted of counseling and education dealing with the complex and emotionally intense issues of symptom management and palliative care in the setting of serious and potentially life-threatening illness.Greater than 50%  of this time was spent  counseling and coordinating care related to the above assessment and plan.  Signed by: Willette Alma, AGPCNP-BC Palliative Medicine Team/[facility]   *Please note that this is a verbal dictation therefore any spelling or grammatical errors are due to the "[employer] One" system interpretation.

## 2023-02-28 NOTE — Progress Notes (Addendum)
eLink Physician-Brief Progress Note Patient Name: ATIBA MIMNAUGH DOB: 10-28-1960 MRN: [id number]   Date of Service  02/28/2023  HPI/Events of Note  62 year old male presented for 4 acute encephalopathy and a fall found to have hypotension and bradycardia.  He was noted to be in likely septic shock with acute kidney injury and presumptive metastatic esophageal cancer.  Initially presented febrile, tachypneic, and mildly bradycardic with hypotension.  He was started on norepinephrine, broad-spectrum antibiotics.  CT chest with known esophageal mass, incompletely imaged hepatic mass, and no PE.  eICU Interventions  Maintain vasopressors some keep MAP greater than 65.  Extensive septic shock workup pending.  Time-limited crystalloid infusion  Previous echocardiogram with moderately reduced RV function, preserved LVEF.  Repeat echo pending.  On DVT prophylaxis with heparin subcutaneous No clear indication for GI prophylaxis.   2327 -mildly increased lactic acid to 3.4.  On 11 mcg of norepinephrine now.  Running LR at 150 cc/h.  Trend lactic acid in the morning.  If norepinephrine exceeds 15 mcg, will add vasopressin.  Intervention Category Evaluation Type: New Patient Evaluation  Yvanna Vidas 02/28/2023, 9:35 PM

## 2023-02-28 NOTE — H&P (Signed)
NAME:  [name] [name], MRN:  [id number], DOB:  1961-02-20, LOS: 0 ADMISSION DATE:  02/28/2023, CONSULTATION DATE:  02/28/23 REFERRING MD:  EDP, CHIEF COMPLAINT:  shock suspected sepsis   History of Present Illness:  62 yo male presented via EMS 2/2 ams and fall, noted to be hypotensive and bradycardic. Was given a IO access in field, IVF and started on vasopressors with return to baseline mental status. Per chart review and d/w friends whom he lives with pt has not been doing well for 24 hours. Pt's blood pressure was elevated last night and he was unable to hold himself up and eat by himself. Pt's friend [name] was able to help him feed him dinner. She reports that she gave his blood pressure meds overnight, his temperature was 100 but he refused to go to ED despite her recommendations as he wanted to make his oncology appt 9/5.  This morning she noted that his BP was low and he was still febrile but not documented. Pt's  home health nurse came to house and despite pt's refusal to come to hospital EMS was called.   He additionally has had self reported hypoxia to 80% on RA and fevers (undocumented). Pt c/o back pain after fall with negative acute findings on imaging. At this time pt has no complaints, he states he feels "ok" but wasn't feeling great yesterday. He states he wants to make his appt tomorrow and is upset that he will not  (for chemo). He denies n/v/d just states he has been run down and now his back hurts after falling. At baseline pt is unable to do normal ADL's but is able to feed himself. His roommates [name] and Tim care for him with bathing and linen changes/clothes, toileting  etc. Pt states he has designated Medical sales representative as Management consultant and believes he has previously filled paperwork out for [facility] for her during his last hospitalization.   Cr elevated from baseline of 0.9 to 1.9. he is acidotic with pH 7.2 and Bicarb of 16 on abg.   Pertinent  Medical History  Recent admission for sepsis 2/2  aspiration pna 12/2022 Metastatic adenocarcinoma likely esophageal cancer stage IV on chemo Esophageal dysmotility RH failure with h/o MI 01/2022 DVT/PE 12/2021 on eliquis PAD s/p AKA 2021 RA H/o COPD  Significant Hospital Events: Including procedures, antibiotic start and stop dates in addition to other pertinent events   Admitted to ICU on vasopressors 9/4  Interim History / Subjective:    Objective   Blood pressure (!) 86/68, pulse (!) 59, temperature 98.2 F (36.8 C), temperature source Oral, resp. rate 18, SpO2 100%.       No intake or output data in the 24 hours ending 02/28/23 1957 There were no vitals filed for this visit.  Examination: General: chronically ill appearing wrapped in blankets.  HENT: ncat, eomi, perrla, mm dry but pink Lungs: diminished bilaterally Cardiovascular: irreg irreg Abdomen: soft nt/nd bs + Extremities: no c/c/e AKA on L Neuro: no focal deficits appreciated GU: deferred  Resolved Hospital Problem list     Assessment & Plan:  Shock, presumed septic:  -f/u cx -empiric abx -titrate vasopressors to map >65 -rvp pending, negative flu and covid however -no discrete pna noted on ct chest but challenging with chronic changes as well  -ua still pending.  -check echo to see if new changes or wall motion abnormalities -will need to further address code status considering his metastatic cancer and now frequent readmissions -hold home antihtn  agents  Tachyarrhythmia:  Bradycardia: H/o afib: -check mag -give calcium  at this time -will be on tele -echo pending -hold home amio in light of recent bradycardia  Aki:  Metabolic acidosis: Lactic acidosis: Hypocalcemia:  -no baseline dysfunction noted on chart review -cont resuscitation as able and monitor I/0 as well as indices -bicarb 2 amps now.  -hold home nsaids and other nephrotoxic meds  Hepatic masses: Elevated alk phos:  -known mets, will check ggt for completeness sake    Chronic microcytic anemia:  Chronic thrombocytopenia:  H/o dvt and pe: -no acute indication for transfusion at this time.  -hold a/c until further delineated line needs -will use sq heparin for now.  -type and screen pending  Hyperglycemia:  -last A1c was 2023 with value of 5.1 -will monitor with ssi and recheck A1c  Metastatic cancer, presumed esophageal:  -noted changes on CT -hold methotrexate in acute septic picture for now.    Best Practice (right click and "Reselect all SmartList Selections" daily)   Diet/type: Regular consistency (see orders) DVT prophylaxis: prophylactic heparin  GI prophylaxis: N/A Lines: N/A Foley:  N/A Code Status:  full code Last date of multidisciplinary goals of care discussion [needs to have ongoing discussions. ]  Labs   CBC: Recent Labs  Lab 02/28/23 1641 02/28/23 1726 02/28/23 1739  WBC 9.9  --   --   NEUTROABS 7.4  --   --   HGB 10.5* 10.9* 11.6*  HCT 34.6* 32.0* 34.0*  MCV 91.3  --   --   PLT 121*  --   --     Basic Metabolic Panel: Recent Labs  Lab 02/28/23 1641 02/28/23 1726 02/28/23 1739  NA 137 141 141  K 4.0 4.5 4.9  CL 111  --  110  CO2 19*  --   --   GLUCOSE 150*  --  134*  BUN 29*  --  29*  CREATININE 1.95*  --  1.90*  CALCIUM 7.3*  --   --    GFR: CrCl cannot be calculated (Unknown ideal weight.). Recent Labs  Lab 02/28/23 1641 02/28/23 1654 02/28/23 1837  WBC 9.9  --   --   LATICACIDVEN  --  2.0* 2.6*    Liver Function Tests: Recent Labs  Lab 02/28/23 1641  AST 68*  ALT 41  ALKPHOS 265*  BILITOT 0.5  PROT 6.2*  ALBUMIN 2.2*   No results for input(s): "LIPASE", "AMYLASE" in the last 168 hours. No results for input(s): "AMMONIA" in the last 168 hours.  ABG    Component Value Date/Time   PHART 7.298 (L) 02/28/2023 1726   PCO2ART 33.2 02/28/2023 1726   PO2ART 412 (H) 02/28/2023 1726   HCO3 16.1 (L) 02/28/2023 1726   TCO2 17 (L) 02/28/2023 1739   ACIDBASEDEF 9.0 (H) 02/28/2023 1726    O2SAT 100 02/28/2023 1726     Coagulation Profile: No results for input(s): "INR", "PROTIME" in the last 168 hours.  Cardiac Enzymes: No results for input(s): "CKTOTAL", "CKMB", "CKMBINDEX", "TROPONINI" in the last 168 hours.  HbA1C: Hgb A1c MFr Bld  Date/Time Value Ref Range Status  02/03/2022 03:27 AM 5.1 4.8 - 5.6 % Final    Comment:    (NOTE) Pre diabetes:          5.7%-6.4%  Diabetes:              >6.4%  Glycemic control for   <7.0% adults with diabetes   01/31/2022 01:06 PM 5.0 4.8 - 5.6 %  Final    Comment:    (NOTE) Pre diabetes:          5.7%-6.4%  Diabetes:              >6.4%  Glycemic control for   <7.0% adults with diabetes     CBG: No results for input(s): "GLUCAP" in the last 168 hours.  Review of Systems:   As per HPI  Past Medical History:  He,  has a past medical history of Acute respiratory failure (HCC) (01/31/2022), Bilateral shoulder pain (08/20/2012), Bite wound of left hand (09/27/2021), CAD (coronary artery disease), Cardiac arrest [facility]), Community acquired pneumonia (11/07/2011), Critical limb ischemia with history of revascularization of same extremity (HCC) (04/28/2020), DDD (degenerative disc disease), lumbosacral, DVT (deep venous thrombosis) (HCC) (01/31/2022), Esophageal cancer (HCC), GERD (gastroesophageal reflux disease), History of anemia, History of COVID-19 (05/10/2019), History of critical lower limb ischemia (02/19/2019), Hypertension, Impingement syndrome of shoulder region, Ischemic ulcer of toe of left foot (HCC), Liver mass, Low back pain without sciatica, Lupus (HCC), Multiple fractures of ribs, bilateral, due to CPR trauma, Onychomycosis, Paronychia of great toe, Peripheral vascular disease (HCC), Pressure injury of skin, Pulmonary embolus (HCC), RA (rheumatoid arthritis) (HCC), Rotator cuff tear (11/04/2014), Sternal fracture, and Stroke (HCC).   Surgical History:   Past Surgical History:  Procedure Laterality Date    ABDOMINAL AORTAGRAM  02/20/2019   ABDOMINAL AORTOGRAM W/LOWER EXTREMITY N/A 02/20/2019   Procedure: ABDOMINAL AORTOGRAM W/LOWER EXTREMITY;  Surgeon: Cephus Shelling, MD;  Location: [facility] IN[facility];  Service: Cardiovascular;  Laterality: N/A;   ACHILLES TENDON SURGERY Bilateral    AMPUTATION Left 05/11/2020   Procedure: LEFT ABOVE KNEE AMPUTATION;  Surgeon: Chuck Hint, MD;  Location[facility] OR;  Service: Vascular;  Laterality: Left;   ENDARTERECTOMY POPLITEAL Left 05/01/2020   Procedure: ENDARTERECTOMY POPLITEAL;  Surgeon: Maeola Harman, MD;  Location: [facility] OR;  Service: Vascular;  Laterality: Left;   FASCIOTOMY Left 05/01/2020   Procedure: FASCIOTOMY;  Surgeon: Maeola Harman, MD;  Location: [facility] OR;  Service: Vascular;  Laterality: Left;   FEMORAL-POPLITEAL BYPASS GRAFT Left 02/26/2019   Procedure: BYPASS GRAFT FEMORAL-POPLITEAL ARTERY LEFT LEG USING 6mm PROPATEN GRAFT;  Surgeon: Nada Libman, MD;  Location: [facility] OR;  Service: Vascular;  Laterality: Left;   INTRAOPERATIVE ARTERIOGRAM Left 01/22/2020   Procedure: INTRA OPERATIVE ARTERIOGRAM with administration of thrombolyics in Left femoral to popliteal bypass.;  Surgeon: Cephus Shelling, MD;  Location: [facility] OR;  Service: Vascular;  Laterality: Left;   IR IMAGING GUIDED PORT INSERTION  03/07/2022   LEFT HEART CATH AND CORONARY ANGIOGRAPHY N/A 03/15/2017   Procedure: LEFT HEART CATH AND CORONARY ANGIOGRAPHY;  Surgeon: Yvonne Kendall, MD;  Location: [facility];  Service: Cardiovascular;  Laterality: N/A;   LEFT HEART CATH AND CORONARY ANGIOGRAPHY N/A 02/06/2022   Procedure: LEFT HEART CATH AND CORONARY ANGIOGRAPHY;  Surgeon: Swaziland, Peter M, MD;  Location: [facility] INVASIVE CV LAB;  Service: Cardiovascular;  Laterality: N/A;   LOWER EXTREMITY INTERVENTION Left 04/29/2020   Procedure: LOWER EXTREMITY INTERVENTION- LYSIS;  Surgeon: Cephus Shelling, MD;  Location: [facility];  Service: Cardiovascular;   Laterality: Left;   OSTEOTOMY AND ULNAR SHORTENING Right 07/22/2002   PATCH ANGIOPLASTY Left 05/01/2020   Procedure: PATCH ANGIOPLASTY USING HEMASHIELD PLATINUM FINESSE PATCH;  Surgeon: Maeola Harman, MD;  Location: [facility] OR;  Service: Vascular;  Laterality: Left;   PERIPHERAL VASCULAR ATHERECTOMY  01/23/2020   Procedure: PERIPHERAL VASCULAR ATHERECTOMY;  Surgeon: Chestine Spore,  Canary Brim, MD;  Location: [facility];  Service: Cardiovascular;;   PERIPHERAL VASCULAR BALLOON ANGIOPLASTY  01/23/2020   Procedure: PERIPHERAL VASCULAR BALLOON ANGIOPLASTY;  Surgeon: Cephus Shelling, MD;  Location: [facility];  Service: Cardiovascular;;   PERIPHERAL VASCULAR INTERVENTION Left 04/30/2020   Procedure: PERIPHERAL VASCULAR INTERVENTION;  Surgeon: Leonie Douglas, MD;  Location: [facility];  Service: Cardiovascular;  Laterality: Left;   PERIPHERAL VASCULAR THROMBECTOMY N/A 01/23/2020   Procedure: lysis recheck;  Surgeon: Cephus Shelling, MD;  Location: [facility];  Service: Cardiovascular;  Laterality: N/A;  + Penumbra    PERIPHERAL VASCULAR THROMBECTOMY N/A 04/30/2020   Procedure: Lysis Recheck;  Surgeon: Leonie Douglas, MD;  Location: [facility];  Service: Cardiovascular;  Laterality: N/A;   SHOULDER ARTHROSCOPY WITH BICEPSTENOTOMY Right 03/11/2015   Procedure: SHOULDER ARTHROSCOPY WITH BICEPSTENOTOMY;  Surgeon: Loreta Ave, MD;  Location: [facility];  Service: Orthopedics;  Laterality: Right;   SHOULDER ARTHROSCOPY WITH DISTAL CLAVICLE RESECTION Left 11/19/2014   Procedure: SHOULDER ARTHROSCOPY WITH DISTAL CLAVICLE RESECTION;  Surgeon: Mckinley Jewel, MD;  Location: [facility];  Service: Orthopedics;  Laterality: Left;   SHOULDER ARTHROSCOPY WITH ROTATOR CUFF REPAIR AND SUBACROMIAL DECOMPRESSION Left 11/19/2014   Procedure: LEFT SHOULDER ARTHROSCOPY, DEBRIDEMENT DISTAL CLAVICLE EXCISION, ACROMIOPLASTY WITH ROTATOR CUFF REPAIR ;   Surgeon: Mckinley Jewel, MD;  Location: [facility];  Service: Orthopedics;  Laterality: Left;   THROMBECTOMY OF BYPASS GRAFT FEMORAL- POPLITEAL ARTERY Left 05/01/2020   Procedure: THROMBECTOMY OF LOWER EXTREMITY;  Surgeon: Maeola Harman, MD;  Location: [facility] OR;  Service: Vascular;  Laterality: Left;   TRANSFORAMINAL LUMBAR INTERBODY FUSION (TLIF) WITH PEDICLE SCREW FIXATION 1 LEVEL N/A 01/03/2018   Procedure: TRANSFORAMINAL LUMBAR INTERBODY FUSION (TLIF) LUMBAR FIVE-SACRAL ONE;  Surgeon: Venita Lick, MD;  Location: [facility] OR;  Service: Orthopedics;  Laterality: N/A;   ULTRASOUND GUIDANCE FOR VASCULAR ACCESS Right 01/22/2020   Procedure: ULTRASOUND GUIDANCE FOR VASCULAR ACCESS Right Common Femoral Artery.;  Surgeon: Cephus Shelling, MD;  Location: [facility] OR;  Service: Vascular;  Laterality: Right;   VIDEO ASSISTED THORACOSCOPY (VATS)/DECORTICATION Right 11/21/2011   drainage of empyema     Social History:   reports that he quit smoking about 4 years ago. His smoking use included cigarettes. He started smoking about 24 years ago. He has a 5 pack-year smoking history. He has never used smokeless tobacco. He reports that he does not drink alcohol and does not use drugs.   Family History:  His family history includes Alcohol abuse in his father; Aneurysm in his mother; Heart attack (age of onset: 76) in his father. There is no history of Stroke or Cancer.   Allergies Allergies  Allergen Reactions   Dilaudid [Hydromorphone Hcl] Rash   Ramipril Rash and Other (See Comments)    Per patient, rash was on the right arm and leg only; no angioedema   Tramadol Itching and Rash     Home Medications  Prior to Admission medications   Medication Sig Start Date End Date Taking? Authorizing Provider  albuterol (VENTOLIN HFA) 108 (90 Base) MCG/ACT inhaler INHALE 2 PUFFS INTO THE LUNGS EVERY 6 (SIX) HOURS AS NEEDED FOR WHEEZING OR SHORTNESS OF BREATH. 01/29/23   Ladene Artist, MD   amiodarone (PACERONE) 200 MG tablet Take 1 tablet (200 mg total) by mouth 2 (two) times daily. 09/27/22 09/27/23  Adron Bene, MD  amLODipine (NORVASC) 10 MG tablet Take 1  tablet (10 mg total) by mouth daily. 08/09/22 01/08/23  Carrion-Carrero, Karle Starch, MD  apixaban (ELIQUIS) 5 MG TABS tablet TAKE 1 TABLET (5 MG TOTAL) BY MOUTH 2 (TWO) TIMES DAILY. 02/20/23   Rocky Morel, DO  atorvastatin (LIPITOR) 40 MG tablet Take 1 tablet (40 mg total) by mouth daily at 6 PM. 09/14/22 01/08/23  Rocky Morel, DO  clopidogrel (PLAVIX) 75 MG tablet Take 75 mg by mouth daily. 09/14/22   [provider]  diclofenac Sodium (VOLTAREN) 1 % GEL Apply 2 g topically 4 (four) times daily. 08/09/22   Carrion-Carrero, Karle Starch, MD  DULoxetine (CYMBALTA) 30 MG capsule Take 30 mg by mouth daily as needed (sleep). 06/21/22   [provider]  feeding supplement (ENSURE ENLIVE / ENSURE PLUS) LIQD Take 237 mLs by mouth 3 (three) times daily between meals. 08/09/22   Carrion-Carrero, Karle Starch, MD  ferrous sulfate 325 (65 FE) MG tablet Take 1 tablet (325 mg total) by mouth daily with breakfast for 100 doses. 11/30/22 03/10/23  Rocky Morel, DO  fluticasone-salmeterol (ADVAIR HFA) [phone] MCG/ACT inhaler Inhale 2 puffs into the lungs 2 (two) times daily. 10/03/22   Adron Bene, MD  folic acid (FOLVITE) 1 MG tablet Take 1 mg by mouth daily.    [provider]  gabapentin (NEURONTIN) 300 MG capsule Take 1 capsule (300 mg total) by mouth 2 (two) times daily. Daily or twice daily if tolerated. 11/09/22   Rocky Morel, DO  isosorbide mononitrate (IMDUR) 30 MG 24 hr tablet Take 0.5 tablets (15 mg total) by mouth daily. 02/05/23 03/07/23  Terald Sleeper, MD  lidocaine-prilocaine (EMLA) cream Apply 1 Application topically as directed. Apply to port site 2 hours prior to stick and cover with Press-and-Seal to numb port before access 05/29/22   Ladene Artist, MD  loperamide (IMODIUM A-D) 2 MG tablet Take 2 mg by mouth  4 (four) times daily as needed for diarrhea or loose stools.    [provider]  meloxicam (MOBIC) 7.5 MG tablet Take 1 tablet (7.5 mg total) by mouth daily as needed for pain. 11/30/22 03/30/23  Rocky Morel, DO  methotrexate (RHEUMATREX) 2.5 MG tablet Take 3 tablets (7.5 mg total) by mouth once a week. Caution:Chemotherapy. Protect from light. 10/17/22   Rocky Morel, DO  ondansetron (ZOFRAN) 8 MG tablet START TAKING 72 HOURS AFTER CHEMOTHERAPY TREATMENT. 02/14/23   Ladene Artist, MD  Oxycodone HCl 10 MG TABS Take 10 mg by mouth every 6 (six) hours. 09/14/22   [provider]  potassium chloride (KLOR-CON) 10 MEQ tablet TAKE 2 TABLETS(20 MEQ) BY MOUTH DAILY 02/07/23   Ladene Artist, MD  prochlorperazine (COMPAZINE) 10 MG tablet TAKE 1 TABLET (10 MG TOTAL) BY MOUTH EVERY 6 (SIX) HOURS AS NEEDED. 01/29/23   Ladene Artist, MD     Critical care time: 45 min

## 2023-02-28 NOTE — Progress Notes (Signed)
Notified provider of need to order repeat lactic acid. ° °

## 2023-02-28 NOTE — Sepsis Progress Note (Signed)
Elink will follow per sepsis protocol  

## 2023-02-28 NOTE — ED Triage Notes (Signed)
Pt to ED via EMS from home. Pt lives with family. Pt has hx of cancer and family reports steady decline x1 day. Pts family reports pt sating 80% RA and fever. Upon EMS arrival, pt was also bradycardic at 50 HR sinus brady. EMS also reports pt having 2 periods of apnea en route. Pt was initally hypotensive with EMS at 60/40. EMS placed an I.O. on pt and gave 1L NS bolus en route bringing pts pressure up to 92/60. Pt GCS 13-14 with EMS. Pt is AAOx4 baseline. EMS had difficulty obtaining SPO2 reading, EMS reports best SPO2 reading they were able to obtain was 92% on NRB.    EMS:  201 CBG 92/60 92% NRB

## 2023-02-28 NOTE — ED Notes (Signed)
Attempted to collect urine sample from pt x2. Pt stated he was unable to provide sample still at this time.

## 2023-02-28 NOTE — Progress Notes (Signed)
Pharmacy Antibiotic Note  [name] [name] is a 62 y.o. male for which pharmacy has been consulted for vancomycin dosing for sepsis.  Patient with a history of VF cardiac arrest, PE/DVT on Eliquis, stroke, PAD, and metastatic esophageal cancer . Patient presenting with hypotension and hypoxia.  SCr 1.9 - AKI WBC 9.9; LA 2.6; T 98.2; HR 58; RR 23 COVID neg / flu neg  Plan: Metronidazole per MD Cefepime 2g q12hr  Vancomycin 1000 mg once, subsequent dosing as indicated per random vancomycin level until renal function stable and/or improved, at which time scheduled dosing can be considered Monitor WBC, fever, renal function, cultures De-escalate when able     Temp (24hrs), Avg:99.5 F (37.5 C), Min:98.2 F (36.8 C), Max:100.7 F (38.2 C)  Recent Labs  Lab 02/28/23 1641 02/28/23 1654 02/28/23 1739 02/28/23 1837  WBC 9.9  --   --   --   CREATININE 1.95*  --  1.90*  --   LATICACIDVEN  --  2.0*  --  2.6*    CrCl cannot be calculated (Unknown ideal weight.).    Allergies  Allergen Reactions   Dilaudid [Hydromorphone Hcl] Rash   Ramipril Rash and Other (See Comments)    Per patient, rash was on the right arm and leg only; no angioedema   Tramadol Itching and Rash   Microbiology results: Pending  Thank you for allowing pharmacy to be a part of this patient's care.  Delmar Landau, PharmD, BCPS 02/28/2023 8:04 PM ED Clinical Pharmacist -  [phone]

## 2023-02-28 NOTE — Progress Notes (Signed)
ED Pharmacy Antibiotic Sign Off An antibiotic consult was received from an ED provider for cefepime and vancomycin per pharmacy dosing for sepsis. A chart review was completed to assess appropriateness.  A single dose of cefepime and vancomycin was placed by the ED provider.   The following one time order(s) were placed per pharmacy consult: None  Further antibiotic and/or antibiotic pharmacy consults should be ordered by the admitting provider if indicated.   Thank you for allowing pharmacy to be a part of this patient's care.   Delmar Landau, PharmD, BCPS 02/28/2023 4:44 PM ED Clinical Pharmacist -  [phone]

## 2023-02-28 NOTE — ED Notes (Signed)
Attempted to call report to ICU x 1. 

## 2023-02-28 NOTE — ED Notes (Signed)
I made the nurse attached the patient aware that the patient keeps dropping in and out of vfib (ventricular fibrillation) with a run of 7 pvcs.

## 2023-02-28 NOTE — ED Notes (Signed)
Attempted to call report to ICU x2.

## 2023-02-28 NOTE — ED Triage Notes (Signed)
Pt answering orientation questions appropriately upon pt arrival to ED.

## 2023-02-28 NOTE — ED Provider Notes (Signed)
[place] EMERGENCY DEPARTMENT AT [facility] Provider Note   CSN: [id number] Arrival date & time: 02/28/23  1629     History {Add pertinent medical, surgical, social history, OB history to HPI:1} Chief Complaint  Patient presents with   Hypotension   Fever    [name] [name] is a 62 y.o. male.  62 year old male with a history of VF cardiac arrest, PE/DVT on Eliquis, stroke, PAD, and metastatic esophageal cancer who presents to the emergency department with fever, cough, and back pain.  History obtained per EMS and the patient.  Says that over the past several days has been having generalized weakness.  Yesterday started having low oxygen saturations in the 80% on room air.  Also reported a fever.  When EMS arrived the patient was hypotensive with a pressure of 60/40 and was given 1 L of normal saline through an IO in [place] left shoulder due to difficulty with IV access.  His mentation had improved upon arrival to the emergency department.  They did report some episodes of apnea.  Patient says that he fell out of bed this morning and hit his head as well as his back.  Denies any nausea or vomiting or diarrhea.  Says he has had a productive cough.  Denies any chest pain and says he has been compliant with his Eliquis.       Home Medications Prior to Admission medications   Medication Sig Start Date End Date Taking? Authorizing Provider  albuterol (VENTOLIN HFA) 108 (90 Base) MCG/ACT inhaler INHALE 2 PUFFS INTO THE LUNGS EVERY 6 (SIX) HOURS AS NEEDED FOR WHEEZING OR SHORTNESS OF BREATH. 01/29/23   Ladene Artist, MD  amiodarone (PACERONE) 200 MG tablet Take 1 tablet (200 mg total) by mouth 2 (two) times daily. 09/27/22 09/27/23  Adron Bene, MD  amLODipine (NORVASC) 10 MG tablet Take 1 tablet (10 mg total) by mouth daily. 08/09/22 01/08/23  Carrion-Carrero, Karle Starch, MD  apixaban (ELIQUIS) 5 MG TABS tablet TAKE 1 TABLET (5 MG TOTAL) BY MOUTH 2 (TWO) TIMES DAILY. 02/20/23   Rocky Morel, DO  atorvastatin (LIPITOR) 40 MG tablet Take 1 tablet (40 mg total) by mouth daily at 6 PM. 09/14/22 01/08/23  Rocky Morel, DO  clopidogrel (PLAVIX) 75 MG tablet Take 75 mg by mouth daily. 09/14/22   [provider]  diclofenac Sodium (VOLTAREN) 1 % GEL Apply 2 g topically 4 (four) times daily. 08/09/22   Carrion-Carrero, Karle Starch, MD  DULoxetine (CYMBALTA) 30 MG capsule Take 30 mg by mouth daily as needed (sleep). 06/21/22   [provider]  feeding supplement (ENSURE ENLIVE / ENSURE PLUS) LIQD Take 237 mLs by mouth 3 (three) times daily between meals. 08/09/22   Carrion-Carrero, Karle Starch, MD  ferrous sulfate 325 (65 FE) MG tablet Take 1 tablet (325 mg total) by mouth daily with breakfast for 100 doses. 11/30/22 03/10/23  Rocky Morel, DO  fluticasone-salmeterol (ADVAIR HFA) [phone] MCG/ACT inhaler Inhale 2 puffs into the lungs 2 (two) times daily. 10/03/22   Adron Bene, MD  folic acid (FOLVITE) 1 MG tablet Take 1 mg by mouth daily.    [provider]  gabapentin (NEURONTIN) 300 MG capsule Take 1 capsule (300 mg total) by mouth 2 (two) times daily. Daily or twice daily if tolerated. 11/09/22   Rocky Morel, DO  isosorbide mononitrate (IMDUR) 30 MG 24 hr tablet Take 0.5 tablets (15 mg total) by mouth daily. 02/05/23 03/07/23  Terald Sleeper, MD  lidocaine-prilocaine (EMLA) cream Apply  1 Application topically as directed. Apply to port site 2 hours prior to stick and cover with Press-and-Seal to numb port before access 05/29/22   Ladene Artist, MD  loperamide (IMODIUM A-D) 2 MG tablet Take 2 mg by mouth 4 (four) times daily as needed for diarrhea or loose stools.    [provider]  meloxicam (MOBIC) 7.5 MG tablet Take 1 tablet (7.5 mg total) by mouth daily as needed for pain. 11/30/22 03/30/23  Rocky Morel, DO  methotrexate (RHEUMATREX) 2.5 MG tablet Take 3 tablets (7.5 mg total) by mouth once a week. Caution:Chemotherapy. Protect from light. 10/17/22    Rocky Morel, DO  ondansetron (ZOFRAN) 8 MG tablet START TAKING 72 HOURS AFTER CHEMOTHERAPY TREATMENT. 02/14/23   Ladene Artist, MD  Oxycodone HCl 10 MG TABS Take 10 mg by mouth every 6 (six) hours. 09/14/22   [provider]  potassium chloride (KLOR-CON) 10 MEQ tablet TAKE 2 TABLETS(20 MEQ) BY MOUTH DAILY 02/07/23   Ladene Artist, MD  prochlorperazine (COMPAZINE) 10 MG tablet TAKE 1 TABLET (10 MG TOTAL) BY MOUTH EVERY 6 (SIX) HOURS AS NEEDED. 01/29/23   Ladene Artist, MD      Allergies    Dilaudid [hydromorphone hcl], Ramipril, and Tramadol    Review of Systems   Review of Systems  Physical Exam Updated Vital Signs Pulse 75   Resp 18  Physical Exam Vitals and nursing note reviewed.  Constitutional:      General: He is in acute distress.     Appearance: He is well-developed. He is ill-appearing.     Comments: Alert and oriented x 3 but is chronically and acutely ill-appearing  HENT:     Head: Normocephalic and atraumatic.     Right Ear: External ear normal.     Left Ear: External ear normal.     Nose: Nose normal.  Eyes:     Extraocular Movements: Extraocular movements intact.     Conjunctiva/sclera: Conjunctivae normal.     Pupils: Pupils are equal, round, and reactive to light.  Cardiovascular:     Rate and Rhythm: Normal rate and regular rhythm.     Heart sounds: Normal heart sounds.  Pulmonary:     Effort: Pulmonary effort is normal. No respiratory distress.     Breath sounds: Normal breath sounds.     Comments: On nonrebreather.  Having difficult time getting an oxygen saturation Abdominal:     General: There is no distension.     Palpations: Abdomen is soft. There is no mass.     Tenderness: There is no abdominal tenderness. There is no guarding.  Musculoskeletal:     Cervical back: Normal range of motion and neck supple.     Right lower leg: No edema.     Comments: Left lower extremity amputation  Skin:    General: Skin is warm and dry.      Capillary Refill: Capillary refill takes more than 3 seconds.  Neurological:     Mental Status: He is alert and oriented to person, place, and time. Mental status is at baseline.  Psychiatric:        Mood and Affect: Mood normal.        Behavior: Behavior normal.     ED Results / Procedures / Treatments   Labs (all labs ordered are listed, but only abnormal results are displayed) Labs Reviewed - No data to display  EKG None  Radiology No results found.  Procedures Procedures  {Document cardiac  monitor, telemetry assessment procedure when appropriate:1}  Medications Ordered in ED Medications - No data to display  ED Course/ Medical Decision Making/ A&P   {   Click here for ABCD2, HEART and other calculatorsREFRESH Note before signing :1}                              Medical Decision Making Amount and/or Complexity of Data Reviewed Labs: ordered. Radiology: ordered. ECG/medicine tests: ordered.  Risk Prescription drug management.   ***  {Document critical care time when appropriate:1} {Document review of labs and clinical decision tools ie heart score, Chads2Vasc2 etc:1}  {Document your independent review of radiology images, and any outside records:1} {Document your discussion with family members, caretakers, and with consultants:1} {Document social determinants of health affecting pt's care:1} {Document your decision making why or why not admission, treatments were needed:1} Final Clinical Impression(s) / ED Diagnoses Final diagnoses:  None    Rx / DC Orders ED Discharge Orders     None

## 2023-03-01 ENCOUNTER — Inpatient Hospital Stay

## 2023-03-01 ENCOUNTER — Inpatient Hospital Stay (HOSPITAL_COMMUNITY): Payer: 59

## 2023-03-01 ENCOUNTER — Inpatient Hospital Stay: Admitting: Nurse Practitioner

## 2023-03-01 DIAGNOSIS — R6521 Severe sepsis with septic shock: Secondary | ICD-10-CM | POA: Diagnosis not present

## 2023-03-01 DIAGNOSIS — C787 Secondary malignant neoplasm of liver and intrahepatic bile duct: Secondary | ICD-10-CM

## 2023-03-01 DIAGNOSIS — C801 Malignant (primary) neoplasm, unspecified: Secondary | ICD-10-CM

## 2023-03-01 DIAGNOSIS — A419 Sepsis, unspecified organism: Secondary | ICD-10-CM

## 2023-03-01 DIAGNOSIS — C159 Malignant neoplasm of esophagus, unspecified: Secondary | ICD-10-CM | POA: Diagnosis not present

## 2023-03-01 DIAGNOSIS — R578 Other shock: Secondary | ICD-10-CM | POA: Diagnosis not present

## 2023-03-01 LAB — CBC
HCT: 37 % — ABNORMAL LOW (ref 39.0–52.0)
Hemoglobin: 11.4 g/dL — ABNORMAL LOW (ref 13.0–17.0)
MCH: 27.1 pg (ref 26.0–34.0)
MCHC: 30.8 g/dL (ref 30.0–36.0)
MCV: 88.1 fL (ref 80.0–100.0)
Platelets: 223 10*3/uL (ref 150–400)
RBC: 4.2 MIL/uL — ABNORMAL LOW (ref 4.22–5.81)
RDW: 17.5 % — ABNORMAL HIGH (ref 11.5–15.5)
WBC: 18.6 10*3/uL — ABNORMAL HIGH (ref 4.0–10.5)
nRBC: 0 % (ref 0.0–0.2)

## 2023-03-01 LAB — BLOOD CULTURE ID PANEL (REFLEXED) - BCID2

## 2023-03-01 LAB — RESPIRATORY PANEL BY PCR

## 2023-03-01 LAB — ECHOCARDIOGRAM COMPLETE
Area-P 1/2: 3.86 cm2
S' Lateral: 3.6 cm
Weight: 1996.49 [oz_av]

## 2023-03-01 LAB — LACTIC ACID, PLASMA
Lactic Acid, Venous: 3.4 mmol/L (ref 0.5–1.9)
Lactic Acid, Venous: 4 mmol/L (ref 0.5–1.9)
Lactic Acid, Venous: 4.1 mmol/L (ref 0.5–1.9)

## 2023-03-01 LAB — MAGNESIUM: Magnesium: 1.2 mg/dL — ABNORMAL LOW (ref 1.7–2.4)

## 2023-03-01 LAB — CG4 I-STAT (LACTIC ACID): Lactic Acid, Venous: 2.3 mmol/L (ref 0.5–1.9)

## 2023-03-01 LAB — BASIC METABOLIC PANEL
Anion gap: 12 (ref 5–15)
BUN: 26 mg/dL — ABNORMAL HIGH (ref 8–23)
CO2: 19 mmol/L — ABNORMAL LOW (ref 22–32)
Calcium: 8.4 mg/dL — ABNORMAL LOW (ref 8.9–10.3)
Chloride: 105 mmol/L (ref 98–111)
Creatinine, Ser: 1.62 mg/dL — ABNORMAL HIGH (ref 0.61–1.24)
GFR, Estimated: 48 mL/min — ABNORMAL LOW (ref >60)
Glucose, Bld: 147 mg/dL — ABNORMAL HIGH (ref 70–99)
Potassium: 4.4 mmol/L (ref 3.5–5.1)
Sodium: 136 mmol/L (ref 135–145)

## 2023-03-01 LAB — GLUCOSE, CAPILLARY
Glucose-Capillary: 142 mg/dL — ABNORMAL HIGH (ref 70–99)
Glucose-Capillary: 124 mg/dL — ABNORMAL HIGH (ref 70–99)
Glucose-Capillary: 147 mg/dL — ABNORMAL HIGH (ref 70–99)
Glucose-Capillary: 112 mg/dL — ABNORMAL HIGH (ref 70–99)

## 2023-03-01 LAB — HIV ANTIBODY (ROUTINE TESTING W REFLEX): HIV Screen 4th Generation wRfx: NONREACTIVE

## 2023-03-01 LAB — STREP PNEUMONIAE URINARY ANTIGEN: Strep Pneumo Urinary Antigen: NEGATIVE

## 2023-03-01 MED ORDER — ORAL CARE MOUTH RINSE: 15.0000 mL | OROMUCOSAL | Status: DC | PRN

## 2023-03-01 MED ORDER — PERFLUTREN LIPID MICROSPHERE
1.0000 mL | INTRAVENOUS | Status: AC | PRN
  Administered 2023-03-01: 4 mL via INTRAVENOUS

## 2023-03-01 MED ORDER — POLYETHYLENE GLYCOL 3350 17 G PO PACK
17.0000 g | PACK | Freq: Every day | ORAL | Status: DC
  Administered 2023-03-01 – 2023-03-07 (×7): 17 g via ORAL
  Filled 2023-03-01 (×7): qty 1

## 2023-03-01 MED ORDER — MAGNESIUM SULFATE 2 GM/50ML IV SOLN
2.0000 g | Freq: Once | INTRAVENOUS | Status: AC
  Administered 2023-03-01: 2 g via INTRAVENOUS
  Filled 2023-03-01: qty 50

## 2023-03-01 MED ORDER — VANCOMYCIN HCL 750 MG/150ML IV SOLN
750.0000 mg | INTRAVENOUS | Status: DC
  Filled 2023-03-01: qty 150

## 2023-03-01 MED ORDER — SENNOSIDES-DOCUSATE SODIUM 8.6-50 MG PO TABS
1.0000 | ORAL_TABLET | Freq: Two times a day (BID) | ORAL | Status: DC
  Administered 2023-03-01 – 2023-03-07 (×12): 1 via ORAL
  Filled 2023-03-01 (×13): qty 1

## 2023-03-01 MED ORDER — SODIUM CHLORIDE 0.9 % IV SOLN
2.0000 g | INTRAVENOUS | Status: DC
  Administered 2023-03-01 – 2023-03-06 (×6): 2 g via INTRAVENOUS
  Filled 2023-03-01 (×6): qty 20

## 2023-03-01 MED ORDER — VASOPRESSIN 20 UNITS/100 ML INFUSION FOR SHOCK
0.0400 [IU]/min | INTRAVENOUS | Status: DC
  Administered 2023-03-01 (×4): 0.04 [IU]/min via INTRAVENOUS
  Administered 2023-03-02: 0.03 [IU]/min via INTRAVENOUS
  Filled 2023-03-01 (×5): qty 100

## 2023-03-01 MED ORDER — GERHARDT'S BUTT CREAM
TOPICAL_CREAM | Freq: Three times a day (TID) | CUTANEOUS | Status: DC
  Administered 2023-03-01 (×2): 1 via TOPICAL
  Filled 2023-03-01: qty 1

## 2023-03-01 MED ORDER — FENTANYL CITRATE PF 50 MCG/ML IJ SOSY
50.0000 ug | PREFILLED_SYRINGE | INTRAMUSCULAR | Status: DC | PRN
  Administered 2023-03-01 – 2023-03-03 (×4): 50 ug via INTRAVENOUS
  Filled 2023-03-01 (×4): qty 1

## 2023-03-01 NOTE — Progress Notes (Signed)
Called Richelle Ito (patient friend) per patient's request. Confirmed identity. Gave her update. She plans to come see patient today or tomorrow. Answered all questions.

## 2023-03-01 NOTE — Progress Notes (Signed)
Spoke with Alcide Evener of [facility] regarding patients esophageal dysmotility and suspected GI source of sepsis. RUQ results pending. They have agreed to see patient. We greatly appreciate GI's assistance with this patient's care.

## 2023-03-01 NOTE — Progress Notes (Signed)
eLink Physician-Brief Progress Note Patient Name: [name] [name] DOB: 1960/08/10 MRN: [id number]   Date of Service  03/01/2023  HPI/Events of Note  62 year old male presented for 4 acute encephalopathy and a fall found to have hypotension and bradycardia. He was noted to be in likely septic shock with acute kidney injury and presumptive metastatic esophageal cancer.   Has chronic pain on scheduled opiate therapy with as needed note be its as well.  Currently complaining out of 6 out of 10 pain with appropriate response to oxycodone.  eICU Interventions  Has allergy to the Dilaudid, fentanyl every 4 hours as needed for breakthrough     Intervention Category Intermediate Interventions: Pain - evaluation and management  Amanie Mcculley 03/01/2023, 8:28 PM

## 2023-03-01 NOTE — Progress Notes (Signed)
IP PROGRESS NOTE  Subjective:   [name] is well-known to me with a history of metastatic adenocarcinoma, likely of gastroesophageal origin.  He was last treated with paclitaxel/ramucirumab on 01/08/2023.  He missed subsequent follow-up visits.  [name] friend/caretakers are at the bedside.  They report he developed a low-grade fever and felt "cold "on the evening of 02/27/2023.  They noted [name] blood pressure to be low yesterday morning.  He had a fall.  He was transported to the emergency room by EMS. He was noted to be hypotensive, febrile and bradycardic.  The creatinine and lactate were elevated. He was admitted to the ICU and placed antibiotics and pressor support.  A blood culture returned positive for Klebsiella.  [name] no complaint at present.  He reports feeling well prior to yesterday.  Objective: Vital signs in last 24 hours: Blood pressure 111/84, pulse (!) 53, temperature 98.2 F (36.8 C), temperature source Oral, resp. rate (!) 24, weight 124 lb 12.5 oz (56.6 kg), SpO2 94%.  Intake/Output from previous day: 09/04 0701 - 09/05 0700 In: 3143.5 [P.O.:120; I.V.:2084.6; IV Piggyback:938.9] Out: 200 [Urine:200]  Physical Exam:  HEENT: No thrush Lungs: Distant breath sounds, no respiratory distress Cardiac: Regular rate and rhythm Abdomen: Nontender, no hepatosplenomegaly, no mass Extremities: No right leg edema Neurologic: Alert and oriented, follows commands  Portacath/PICC-without erythema  Lab Results: Recent Labs    02/28/23 1641 02/28/23 1726 02/28/23 1739 03/01/23 0606  WBC 9.9  --   --  18.6*  HGB 10.5*   < > 11.6* 11.4*  HCT 34.6*   < > 34.0* 37.0*  PLT 121*  --   --  223   < > = values in this interval not displayed.    BMET Recent Labs    02/28/23 1641 02/28/23 1726 02/28/23 1739 03/01/23 0606  NA 137   < > 141 136  K 4.0   < > 4.9 4.4  CL 111  --  110 105  CO2 19*  --   --  19*  GLUCOSE 150*  --  134* 147*  BUN 29*  --  29* 26*   CREATININE 1.95*  --  1.90* 1.62*  CALCIUM 7.3*  --   --  8.4*   < > = values in this interval not displayed.    Lab Results  Component Value Date   CEA1 287.0 (H) 02/11/2022   CEA 191.50 (H) 01/08/2023   CAN199 88 (H) 02/11/2022    Studies/Results: US Abdomen Limited RUQ (LIVER/GB)  Result Date: 03/01/2023 CLINICAL DATA:  Biliary obstruction. History of gastroesophageal cancer. EXAM: ULTRASOUND ABDOMEN LIMITED RIGHT UPPER QUADRANT COMPARISON:  CT 09/12/2022.  CT angiogram chest 02/28/2023 FINDINGS: Gallbladder: Surgically removed Common bile duct: Diameter: 2 mm. There are some mild areas of intrahepatic biliary duct ectasia as seen on previous examinations. There is significant biliary gas on the prior CT scan in the left hepatic lobe Liver: Heterogeneous echogenic liver with multiple mass lesions as seen previously consistent with known metastatic disease. Please correlate with the prior CT. Portal vein is patent on color Doppler imaging with normal direction of blood flow towards the liver. Other: Mild ascites. IMPRESSION: Previous cholecystectomy. No common ductal dilatation. There is some areas of intrahepatic biliary duct ectasia as seen on prior CT scans. Air in the biliary tree on the prior CT. Heterogeneous liver with multiple mass lesions consistent with known liver metastases better seen on the prior examinations. Trace ascites Electronically Signed   By: Scarlette Shorts  Chales Abrahams M.D.   On: 03/01/2023 13:35   ECHOCARDIOGRAM COMPLETE  Result Date: 03/01/2023    ECHOCARDIOGRAM REPORT   Patient Name:   [name] [name] Date of Exam: 03/01/2023 Medical Rec #:  [id number]     Height:       68.0 in Accession #:    [id number]    Weight:       124.8 lb Date of Birth:  [birth year]    BSA:          1.672 m Patient Age:    61 years      BP:           88/72 mmHg Patient Gender: M             HR:           53 bpm. Exam Location:  Inpatient Procedure: 2D Echo, Cardiac Doppler, Color Doppler and Intracardiac             Opacification Agent Indications:    Shock R57.9  History:        [name] prior history of Echocardiogram examinations, most                 recent 12/27/2022. CAD; Risk Factors:Hypertension.  Sonographer:    Harriette Bouillon RDCS Referring Phys: [id number] JESSICA MARSHALL IMPRESSIONS  1. Left ventricular ejection fraction, by estimation, is 55 to 60%. The left ventricle [name] normal function. The left ventricle [name] no regional wall motion abnormalities. Left ventricular diastolic parameters are indeterminate.  2. Right ventricular systolic function is severely reduced. The right ventricular size is severely enlarged.  3. Right atrial size was severely dilated.  4. The mitral valve is normal in structure. Trivial mitral valve regurgitation. No evidence of mitral stenosis.  5. Tricuspid valve regurgitation is moderate.  6. The aortic valve is normal in structure. Aortic valve regurgitation is trivial. No aortic stenosis is present.  7. The inferior vena cava is dilated in size with <50% respiratory variability, suggesting right atrial pressure of 15 mmHg. FINDINGS  Left Ventricle: Left ventricular ejection fraction, by estimation, is 55 to 60%. The left ventricle [name] normal function. The left ventricle [name] no regional wall motion abnormalities. Definity contrast agent was given IV to delineate the left ventricular  endocardial borders. The left ventricular internal cavity size was normal in size. There is no left ventricular hypertrophy. Left ventricular diastolic parameters are indeterminate. Right Ventricle: The right ventricular size is severely enlarged. Right ventricular systolic function is severely reduced. Left Atrium: Left atrial size was normal in size. Right Atrium: Right atrial size was severely dilated. Pericardium: There is no evidence of pericardial effusion. Mitral Valve: The mitral valve is normal in structure. Mild mitral annular calcification. Trivial mitral valve regurgitation. No evidence of mitral  valve stenosis. Tricuspid Valve: The tricuspid valve is normal in structure. Tricuspid valve regurgitation is moderate . No evidence of tricuspid stenosis. Aortic Valve: The aortic valve is normal in structure. Aortic valve regurgitation is trivial. No aortic stenosis is present. Pulmonic Valve: The pulmonic valve was normal in structure. Pulmonic valve regurgitation is not visualized. No evidence of pulmonic stenosis. Aorta: The aortic root is normal in size and structure. Venous: The inferior vena cava is dilated in size with less than 50% respiratory variability, suggesting right atrial pressure of 15 mmHg. IAS/Shunts: No atrial level shunt detected by color flow Doppler.  LEFT VENTRICLE PLAX 2D LVIDd:         4.30 cm LVIDs:  3.60 cm LV PW:         1.10 cm LV IVS:        1.10 cm LVOT diam:     2.30 cm LV SV:         51 LV SV Index:   31 LVOT Area:     4.15 cm  RIGHT VENTRICLE            IVC RV S prime:     7.37 cm/s  IVC diam: 2.80 cm TAPSE (M-mode): 1.5 cm LEFT ATRIUM           Index        RIGHT ATRIUM           Index LA diam:      3.60 cm 2.15 cm/m   RA Area:     24.10 cm LA Vol (A4C): 26.2 ml 15.67 ml/m  RA Volume:   78.50 ml  46.94 ml/m  AORTIC VALVE LVOT Vmax:   78.30 cm/s LVOT Vmean:  52.300 cm/s LVOT VTI:    0.123 m  AORTA Ao Root diam: 3.80 cm Ao Asc diam:  3.10 cm MITRAL VALVE MV Area (PHT): 3.86 cm    SHUNTS MV E velocity: 52.30 cm/s  Systemic VTI:  0.12 m                            Systemic Diam: 2.30 cm Olga Millers MD Electronically signed by Olga Millers MD Signature Date/Time: 03/01/2023/12:32:48 PM    Final    CT Angio Chest PE W and/or Wo Contrast  Result Date: 02/28/2023 CLINICAL DATA:  Pulmonary embolism (PE) suspected, high prob. Questionable sepsis. EXAM: CT ANGIOGRAPHY CHEST WITH CONTRAST TECHNIQUE: Multidetector CT imaging of the chest was performed using the standard protocol during bolus administration of intravenous contrast. Multiplanar CT image reconstructions and  MIPs were obtained to evaluate the vascular anatomy. RADIATION DOSE REDUCTION: This exam was performed according to the departmental dose-optimization program which includes automated exposure control, adjustment of the mA and/or kV according to patient size and/or use of iterative reconstruction technique. CONTRAST:  75mL OMNIPAQUE IOHEXOL 350 MG/ML SOLN COMPARISON:  Chest x-ray 02/28/2019 FINDINGS: Cardiovascular: Satisfactory opacification of the pulmonary arteries to the segmental level. No evidence of pulmonary embolism. The main pulmonary artery is normal in caliber. Enlarged heart size. No significant pericardial effusion. The thoracic aorta is normal in caliber. At least moderate atherosclerotic plaque of the thoracic aorta. At least 3 vessel coronary artery calcifications. Right chest wall accessed Port-A-Cath with tip at superior cavoatrial junction. Mediastinum/Nodes: Esophageal lumen is filled with fluid. No enlarged mediastinal, hilar, or axillary lymph nodes. Thyroid gland and trachea demonstrate no significant findings. Lungs/Pleura: Limited evaluation due to motion artifact. Bilateral lower lobe atelectasis. Slightly less conspicuous right upper lobe peripheral ground-glass airspace opacities. Persistent few scattered peribronchovascular ground-glass airspace opacities. No focal consolidation. Previously identified right lower lobe pulmonary nodule not identified on this study and possibly resolved or underlying atelectasis. No pulmonary nodule. No pulmonary mass. No pleural effusion. No pneumothorax. Upper Abdomen: Incompletely evaluated likely increased in size multiple poorly defined and conglomerative hepatic masses. Pneumobilia. Musculoskeletal: No chest wall abnormality. No suspicious lytic or blastic osseous lesions. No acute displaced fracture. Old healed sternal fracture. Please see separately dictated thoracic spine CT 02/28/2023. Review of the MIP images confirms the above findings.  IMPRESSION: 1. No pulmonary embolus. 2. Slightly less conspicuous right upper lobe peripheral ground-glass airspace opacities. Persistent few scattered  peribronchovascular ground-glass airspace opacities. Findings may represent infection/inflammation or scarring with adenocarcinoma not excluded. Recommend continued follow-up. 3. Incompletely evaluated likely increased in size multiple poorly defined and conglomerative hepatic masses. 4. Interval increase in size of a 7.3 x 5.7 cm (from 4.6 x 3.7) gastroesophageal and gastric fundus mass. Associated fluid-filled esophageal lumen. No definite dilatation to suggest high-grade obstruction. 5. Aortic Atherosclerosis (ICD10-I70.0) and at least three-vessel coronary calcification. Electronically Signed   By: Tish Frederickson M.D.   On: 02/28/2023 19:30   CT T-SPINE NO CHARGE  Result Date: 02/28/2023 CLINICAL DATA:  fall on thinners back pain EXAM: CT THORACIC SPINE WITHOUT CONTRAST TECHNIQUE: Multidetector CT images of the thoracic were obtained using the standard protocol without intravenous contrast. RADIATION DOSE REDUCTION: This exam was performed according to the departmental dose-optimization program which includes automated exposure control, adjustment of the mA and/or kV according to patient size and/or use of iterative reconstruction technique. COMPARISON:  CT angiography chest 02/11/2022 FINDINGS: Alignment: Exaggerated kyphotic curvature along the upper midthoracic spine likely positional. Otherwise normal alignment. Vertebrae: No acute fracture or focal pathologic process. Paraspinal and other soft tissues: Negative. Disc levels: Maintained. Other: Please see separately dictated CT angiography chest 02/28/2023. IMPRESSION: No acute displaced fracture or traumatic listhesis of the thoracic spine. Electronically Signed   By: Tish Frederickson M.D.   On: 02/28/2023 19:15   CT Head Wo Contrast  Result Date: 02/28/2023 CLINICAL DATA:  head trauma on AC; fall on  thinners EXAM: CT HEAD WITHOUT CONTRAST CT CERVICAL SPINE WITHOUT CONTRAST TECHNIQUE: Multidetector CT imaging of the head and cervical spine was performed following the standard protocol without intravenous contrast. Multiplanar CT image reconstructions of the cervical spine were also generated. RADIATION DOSE REDUCTION: This exam was performed according to the departmental dose-optimization program which includes automated exposure control, adjustment of the mA and/or kV according to patient size and/or use of iterative reconstruction technique. COMPARISON:  CT head and C-spine 04/29/2022 FINDINGS: CT HEAD FINDINGS Brain: Chronic left occipital infarction. Patchy and confluent areas of decreased attenuation are noted throughout the deep and periventricular white matter of the cerebral hemispheres bilaterally, compatible with chronic microvascular ischemic disease. No evidence of large-territorial acute infarction. No parenchymal hemorrhage. No mass lesion. No extra-axial collection. No mass effect or midline shift. No hydrocephalus. Basilar cisterns are patent. Vascular: No hyperdense vessel. Atherosclerotic calcifications are present within the cavernous internal carotid and vertebral arteries. Skull: No acute fracture or focal lesion. Sinuses/Orbits: Paranasal sinuses and mastoid air cells are clear. The orbits are unremarkable. Other: None. CT CERVICAL SPINE FINDINGS Alignment: Normal. Skull base and vertebrae: Multilevel mild-to-moderate degenerative changes of the spine. No associated severe osseous neural foraminal stenosis. No acute fracture. No aggressive appearing focal osseous lesion or focal pathologic process. Soft tissues and spinal canal: No prevertebral fluid or swelling. No visible canal hematoma. Upper chest: Unremarkable. Other: Atherosclerotic plaque of the carotid arteries within neck. Partially visualized right central venous catheter. IMPRESSION: 1. No acute intracranial abnormality. 2. No  acute displaced fracture or traumatic listhesis of the cervical spine. Electronically Signed   By: Tish Frederickson M.D.   On: 02/28/2023 19:06   CT Cervical Spine Wo Contrast  Result Date: 02/28/2023 CLINICAL DATA:  head trauma on AC; fall on thinners EXAM: CT HEAD WITHOUT CONTRAST CT CERVICAL SPINE WITHOUT CONTRAST TECHNIQUE: Multidetector CT imaging of the head and cervical spine was performed following the standard protocol without intravenous contrast. Multiplanar CT image reconstructions of the cervical spine were also generated. RADIATION  DOSE REDUCTION: This exam was performed according to the departmental dose-optimization program which includes automated exposure control, adjustment of the mA and/or kV according to patient size and/or use of iterative reconstruction technique. COMPARISON:  CT head and C-spine 04/29/2022 FINDINGS: CT HEAD FINDINGS Brain: Chronic left occipital infarction. Patchy and confluent areas of decreased attenuation are noted throughout the deep and periventricular white matter of the cerebral hemispheres bilaterally, compatible with chronic microvascular ischemic disease. No evidence of large-territorial acute infarction. No parenchymal hemorrhage. No mass lesion. No extra-axial collection. No mass effect or midline shift. No hydrocephalus. Basilar cisterns are patent. Vascular: No hyperdense vessel. Atherosclerotic calcifications are present within the cavernous internal carotid and vertebral arteries. Skull: No acute fracture or focal lesion. Sinuses/Orbits: Paranasal sinuses and mastoid air cells are clear. The orbits are unremarkable. Other: None. CT CERVICAL SPINE FINDINGS Alignment: Normal. Skull base and vertebrae: Multilevel mild-to-moderate degenerative changes of the spine. No associated severe osseous neural foraminal stenosis. No acute fracture. No aggressive appearing focal osseous lesion or focal pathologic process. Soft tissues and spinal canal: No prevertebral  fluid or swelling. No visible canal hematoma. Upper chest: Unremarkable. Other: Atherosclerotic plaque of the carotid arteries within neck. Partially visualized right central venous catheter. IMPRESSION: 1. No acute intracranial abnormality. 2. No acute displaced fracture or traumatic listhesis of the cervical spine. Electronically Signed   By: Tish Frederickson M.D.   On: 02/28/2023 19:06   DG Chest Port 1 View  Result Date: 02/28/2023 CLINICAL DATA:  Questionable sepsis-evaluate for abnormality EXAM: PORTABLE CHEST 1 VIEW COMPARISON:  02/05/2023 FINDINGS: Right IJ Port-A-Cath with tip in the right atrium. Stable cardiomediastinal silhouette. Aortic atherosclerotic calcification. Hyperinflation. Increased interstitial coarsening in the right lower lung compared with 02/05/2023. No focal consolidation, pleural effusion, or pneumothorax. No displaced rib fractures. IMPRESSION: Increased interstitial coarsening in the right lower lung compared to 02/05/2023. Findings may be due to volume loss, infection, or edema. Electronically Signed   By: Minerva Fester M.D.   On: 02/28/2023 18:23    Medications: I have reviewed the patient's current medications.  Assessment/Plan:  Metastatic adenocarcinoma, likely esophagus primary - Liver mass, mass or lymph node conglomerate near the GE junction, gastrohepatic ligament and retroperitoneal lymphadenopathy. -02/11/2022 CTA chest-7.8 x 8.5 cm subtle hypoattenuating mass lesion in segment IV of the liver. -02/11/2022 CT abdomen/pelvis-8.7 x 7.8 or ill-defined irregular mass in the medial segment left liver, 4.5 x 3.4 cm necrotic mass lesion just cranial to the esophagogastric junction with metastatic lymphadenopathy in the abdomen and pelvis, irregular nodular peritoneal thickening in the lower abdomen and pelvis -02/11/2022 MRI abdomen-rim-enhancing likely necrotic mass or lymph node conglomerate centered around the gastroesophageal junction measuring 5.0 x 3.9 cm, large  rim-enhancing likely necrotic liver mass centered in the anterior left lobe of the liver measuring 7.8 x 7.2 cm, enlarged gastrohepatic ligament and retroperitoneal lymph nodes -Labs from 02/11/2022-AFP 2.4, CA 19.9 88, CEA 287 -Ultrasound-guided liver biopsy performed 02/15/2022-adenocarcinoma with extensive necrosis, CK7 positive, negative for CK20, CDX2, TTF-1, and PAX8, consistent with a pancreatic, lung, or upper gastrointestinal primary -PET 03/08/2022-hypermetabolic distal esophagus mass, hypermetabolic centrally necrotic hepatic mass, hypermetabolic periportal nodes, exophytic GE junction mass without hypermetabolism-likely benign lesion, no hypermetabolism in the pancreas head, bilateral hypermetabolic adrenal gland lesions, small hypermetabolic left parotid gland lesion.  No loss of mismatch repair protein expression, HER2 3+ -CTs 03/11/2022-no change from PET 03/08/2022, known distal esophagus mass-not visualized on unenhanced CT, large hepatic metastasis and upper abdominal nodal metastases, chronic pancreatitis with 4.3 cm  pseudocyst in the pancreas head -Cycle 1 FOLFOX, trastuzumab, Pembrolizumab 04/06/2022 -Cycle 2 oxaliplatin, 5-FU bolus/pump and trastuzumab held due to hospitalization following cycle 1 with possible coronary vasospasm -Pembrolizumab 04/25/2022 -CT abdomen/pelvis 05/02/2022-increased peripancreatic edema, increased size of pancreas head pseudocyst, increased necrotic nodal masses at the GE junction -Cycle 3 oxaliplatin, trastuzumab, pembrolizumab 05/15/2022; 5-FU held secondary to cardiac toxicity following cycle 1 -Cycle 4 oxaliplatin, trastuzumab 05/29/2022, 5-FU held secondary to cardiac toxicity -Cycle 5 oxaliplatin, trastuzumab, Pembrolizumab (6-week dosing for Pembrolizumab) 06/12/2022, 5-FU held secondary to cardiac toxicity -Cycle 6 oxaliplatin, trastuzumab 06/28/2022, 5-FU held -Cycle 7 oxaliplatin, trastuzumab 07/17/2022, 5-FU held -CTs 07/19/2022-decrease in the  dominant anterior liver mass, 2 new small liver lesions compared to 05/02/2022, new right lower lobe nodule, enlargement of epicardial lymph nodes, slight decrease in size of celiac axis, gastropathic ligament, and portacaval lymphadenopathy, unchanged esophageal thickening -Cycle 8 oxaliplatin and pembrolizumab 08/14/2022, 5-FU and trastuzumab held secondary to potential cardiac toxicity -CTs 09/12/2022-increased number hepatic metastases, few are larger; multiple abnormal lymph nodes identified in the abdomen and lower mediastinum, relatively similar; increased stranding along the mesentery and retroperitoneum; persistent wall thickening of the duodenum with slight ectasia of the lumen; persistent left hepatic lobe biliary duct dilatation, likely compressive from adjacent metastatic lesions; previous 6 mm right lower lobe lung nodule obscured by motion. -Cycle 1 Taxol/cyramza 09/20/2022 -Cycle 2 Taxol/Cyramza 10/04/2022 -Cycle 3 Taxol/Cyramza 10/19/2022 (Taxol dose reduced secondary to thrombocytopenia) -Cycle 4 Taxol/cyramza 11/16/2022 -Cycle 5 Taxol/Cyramza 11/30/2022 -CT chest emergency department 12/17/2022 due to complaint of dyspnea-no enlarged mediastinal, hilar or axillary lymph nodes.  New 7 mm lung nodule seen within the superior segment right lower lobe.  Additional 9 mm posterior right lower lobe lung nodule, previously measured 5 mm on prior study.  Small bilateral pleural effusions.  Multiple liver masses of various sizes, present on the prior exam.  4.6 x 3.7 low-attenuation extending from the posterior aspect of the left lobe of the liver to the region of the gastric fundus, previously measured 4.8 x 3.9 cm -Cycle 6 Taxol/Cyramza 01/08/2023 -CT chest 02/28/2023-slightly less conspicuous right upper lobe peripheral groundglass opacities, likely increase in size of multiple poorly defined hepatic masses, increase in size of a gastroesophageal/gastric fundus mass   2.  Myocardial infarction/cardiac  arrest 01/31/2022 -Echocardiogram 02/01/2022-LVEF 60-65%, right ventricle severely dilated with hypokinesis in the basal and midportion, hypokinesis in the apical portion, right ventricular systolic function moderately reduced -Echocardiogram on 05/01/2022, LVEF 50-55%, decreased right ventricular systolic function, moderately enlarged right ventricle -Echocardiogram 08/05/2022-LVEF 45-50%, mildly decreased left ventricular function, moderate reduced right ventricular function 3.  Microcytic anemia -Labs from 02/11/2022-ferritin 151, iron 13, percent saturation 5% 4.  DVT/pulmonary embolism-bilateral lower extremity DVTs 01/10/2022, pulmonary emboli on chest CT 01/31/2022-apixaban 5.  PAD status post AKA in 2021 6.  RA 7.  Bilateral pleural effusions -Ultrasound-guided thoracentesis performed on 02/11/2022.  Cytology negative for malignancy. 8.  COPD 9.  Admission 03/11/2022 with E. coli bacteremia Ultrasound-guided biopsy/aspiration of dominant necrotic appearing liver lesion 03/17/2022-scant fluid aspirated-negative culture, no organisms or white cells.  Pathology with necrotic tissue. 10.  Admission 04/29/2022 after a fall, multiple skull fractures 11.  Fever, hypotension-Klebsiella bacteremia on blood culture 04/30/2022 12.  Admission 04/09/2022 with chest pain, NSTEMI 13.  Admission 08/04/2022 with ventricular tachycardia, status post cardioversion, noted to have hypokalemia and hypomagnesemia, started on amiodarone 14.  Rash following cycle 3 Taxol/ramucirumab-admitted to [facility] due to concern for SJS.  SJS and disseminated zoster ruled out. 15.  Admission 12/27/2022 with  sepsis syndrome, probable aspiration pneumonia 16.  Admission 02/28/2023 with sepsis syndrome, blood culture positive for Klebsiella    [name] [name] metastatic adenocarcinoma, likely of gastroesophageal origin.  He [name] been treated with multiple systemic therapy agents including chemotherapy, immunotherapy, and targeted therapy.  Systemic  treatment options are limited, but we could consider salvage systemic therapy with deruxtecan and other HER2 directed therapies.  I reviewed the admission chest CT images.  Is difficult to accurately measure the liver lesion and apparent gastroesophageal mass without contrast.  The previously noted lung lesion is not seen.  At the [facility] was stable in July.  He appears to have indolent metastatic adenocarcinoma.  The tumor burden is most likely slowly progressing.  Mr. Kedrowski [name] multiple comorbid conditions including coronary artery disease, heart failure, and COPD.  He been admitted multiple times over the past year.  He is now admitted with gram-negative sepsis.  The source for infection is most likely related to the abdominal tumor burden.  There is no evidence for a Port-A-Cath infection, pneumonia, or a urinary source.  I discussed the poor prognosis with [name] caretakers.  I recommend hospice care.  [name] [name] he wants to continue treatment of the cancer.  I explained the small chance of clinical improvement with further systemic therapy.  The toxicity of treatment may outweigh any potential benefit.  We discussed CODE STATUS.  [name] [name] is unable to make a decision on CODE STATUS and hospice today.  He will think about this and I will return for further discussion in the morning.  I discussed the case with Dr. Denese Killings.  Recommendations: Antibiotics, management of sepsis syndrome per the critical care service Continue discussions regarding CODE STATUS and hospice care I will check on him 03/02/2023 Outpatient follow-up will be scheduled at the Cancer center  Approximately 60 minutes spent on the visit today.  The majority the time was used for counseling and coordination of care.   LOS: 1 day   Thornton Papas, MD   03/01/2023, 3:55 PM

## 2023-03-01 NOTE — Progress Notes (Signed)
PHARMACY - PHYSICIAN COMMUNICATION CRITICAL VALUE ALERT - BLOOD CULTURE IDENTIFICATION (BCID)  [name] [name] is an 62 y.o. male who presented to [facility] on 02/28/2023 with a chief complaint of AMS and fall.  Assessment:  Kleb pneumo growing in 1 of 2 bottles thus far, no resistance.  Possible biliary source.  Name of physician (or Provider) Contacted: Dr. Denese Killings  Current antibiotics: cefepime and Flagyl.  Changes to prescribed antibiotics recommended:  Recommendations accepted by provider Narrow cefepime to Rocephin Continue Flagyl for now  Results for orders placed or performed during the hospital encounter of 02/28/23  Blood Culture ID Panel (Reflexed) (Collected: 02/28/2023  4:41 PM)  Result Value Ref Range   Enterococcus faecalis NOT DETECTED NOT DETECTED   Enterococcus Faecium NOT DETECTED NOT DETECTED   Listeria monocytogenes NOT DETECTED NOT DETECTED   Staphylococcus species NOT DETECTED NOT DETECTED   Staphylococcus aureus (BCID) NOT DETECTED NOT DETECTED   Staphylococcus epidermidis NOT DETECTED NOT DETECTED   Staphylococcus lugdunensis NOT DETECTED NOT DETECTED   Streptococcus species NOT DETECTED NOT DETECTED   Streptococcus agalactiae NOT DETECTED NOT DETECTED   Streptococcus pneumoniae NOT DETECTED NOT DETECTED   Streptococcus pyogenes NOT DETECTED NOT DETECTED   A.calcoaceticus-baumannii NOT DETECTED NOT DETECTED   Bacteroides fragilis NOT DETECTED NOT DETECTED   Enterobacterales DETECTED (A) NOT DETECTED   Enterobacter cloacae complex NOT DETECTED NOT DETECTED   Escherichia coli NOT DETECTED NOT DETECTED   Klebsiella aerogenes NOT DETECTED NOT DETECTED   Klebsiella oxytoca NOT DETECTED NOT DETECTED   Klebsiella pneumoniae DETECTED (A) NOT DETECTED   Proteus species NOT DETECTED NOT DETECTED   Salmonella species NOT DETECTED NOT DETECTED   Serratia marcescens NOT DETECTED NOT DETECTED   Haemophilus influenzae NOT DETECTED NOT DETECTED   Neisseria meningitidis  NOT DETECTED NOT DETECTED   Pseudomonas aeruginosa NOT DETECTED NOT DETECTED   Stenotrophomonas maltophilia NOT DETECTED NOT DETECTED   Candida albicans NOT DETECTED NOT DETECTED   Candida auris NOT DETECTED NOT DETECTED   Candida glabrata NOT DETECTED NOT DETECTED   Candida krusei NOT DETECTED NOT DETECTED   Candida parapsilosis NOT DETECTED NOT DETECTED   Candida tropicalis NOT DETECTED NOT DETECTED   Cryptococcus neoformans/gattii NOT DETECTED NOT DETECTED   CTX-M ESBL NOT DETECTED NOT DETECTED   Carbapenem resistance IMP NOT DETECTED NOT DETECTED   Carbapenem resistance KPC NOT DETECTED NOT DETECTED   Carbapenem resistance NDM NOT DETECTED NOT DETECTED   Carbapenem resist OXA 48 LIKE NOT DETECTED NOT DETECTED   Carbapenem resistance VIM NOT DETECTED NOT DETECTED    Fate Galanti D. Laney Potash, PharmD, BCPS, BCCCP 03/01/2023, 10:14 AM

## 2023-03-01 NOTE — Consult Note (Signed)
WOC Nurse Consult Note: Reason for Consult: sacrum  Wound type: 1. Deep Tissue Pressure Injury sacrum evolving  2.  Moisture Associated Skin Damage with ? Fungal component to buttocks/upper thighs  ICD-10 CM Codes for Irritant Dermatitis  L24A2 - Due to fecal, urinary or dual incontinence Pressure Injury POA: Yes Measurement: DTPI to sacrum/coccyx 8 cm x 8 cm total area with evolving areas of partial thickness skin loss   Wound bed: purple maroon discoloration with scattered areas of partial thickness skin loss that are pink and moist  Drainage (amount, consistency, odor) minimal serosanguinous  Periwound: erythema extending to buttocks and upper thighs with ? Fungal appearance  Dressing procedure/placement/frequency:  Cleanse sacrum/coccyx DTPI with soap and water, apply Xeroform gauze [place] [phone]) to purple maroon discoloration daily.  May secure with silicone foam or ABD pad whichever is preferred.  Apply Gerhard'ts Butt Cream to buttocks/upper thighs 3 times daily and prn soiling.   POC discussed with bedside nurse. WOC team will not follow at this time. Re-consult if further needs arise.   Thank you,     Priscella Mann MSN, RN-BC, [employer] [phone]

## 2023-03-01 NOTE — Plan of Care (Signed)

## 2023-03-01 NOTE — Progress Notes (Signed)
NAME:  [name] [name], MRN:  [id number], DOB:  August 24, 1960, LOS: 1 ADMISSION DATE:  02/28/2023, CONSULTATION DATE:  02/28/2023 REFERRING MD:  EDP, CHIEF COMPLAINT:  Septic Shock   History of Present Illness:  62 y.o male presented on 9/4 via EMS 2/2 AMS and fall, noted to be hypotensive and bradycardic. Admitted to ICU for pressor requirement, did not have respiratory distress. His sepsis source was thought to be respiratory as he has hx of aspiration pneumonia 2/2 esophageal dysmotility related to his cancer.   Morning of 9/5 Blood cultures growing Klebsiella and Enterobacter, suspect GI cause of his sepsis. Pneumonia not likely as he has no respiratory symptoms. Still requiring pressor support.  Pertinent  Medical History  Recent admission for sepsis 2/2 aspiration pna 12/2022 Metastatic adenocarcinoma likely esophageal cancer stage IV on chemo Esophageal dysmotility RH failure with h/o MI 01/2022 DVT/PE 12/2021 on eliquis PAD s/p AKA 2021 RA H/o COPD  Significant Hospital Events: Including procedures, antibiotic start and stop dates in addition to other pertinent events   Admitted to ICU on vasopressors 9/4  9/5 Klebsiella + Enterobacter bacteremia. Transitioned to CTX and Flagyl.  Interim History / Subjective:  Patient reports he feels better, not having abdominal pain.  Objective   Blood pressure 117/76, pulse (!) 54, temperature 98.3 F (36.8 C), temperature source Oral, resp. rate 12, weight 56.6 kg, SpO2 93%.        Intake/Output Summary (Last 24 hours) at 03/01/2023 0924 Last data filed at 03/01/2023 0700 Gross per 24 hour  Intake 3143.49 ml  Output 200 ml  Net 2943.49 ml   Filed Weights   02/28/23 2045 03/01/23 0500  Weight: 61.2 kg 56.6 kg    Examination: General: NAD, chronically ill-appearing Lungs: CTAB, normal WOB on RA Cardiovascular: sinus bradycardia, no MRG Abdomen: Soft, not tender, not distended. BS present. Extremities: No edema, moving all 4  extremities Neuro: A&Ox4, answers questions slowly but appropriately  Resolved Hospital Problem list    Assessment & Plan:  Septic Shock Bcx growing Klebsiella + Enterobacter, concerned for GI source. Known metastatic adenocarcinoma of esophagus. Liver mass present. Will obtain RUQ U/S to evaluate for biliary tree dilation. -Transition cefepime to Ceftriaxone, continue flagyl, D/C vanc -mIVF -Norepinephrine + vasopressin titrate MAP >65 -Repeat lactic acid  Bradycardia H/o afib.  -Replete Mag/Ca -Tele -Echo pending -Hold home amio  AKI Metabolic acidosis Improving with fluid resustiation.  -mIVF -Repeat LA  Metastatic cancer, presumed esophageal Followed by [facility], receiving chemo treatments. Noted to have esophageal dysmotility, likely related to his cancer. GI was questioning scope, but patient had poor follow-up with his Onc and GI. RUQ U/S as above. Will discuss case with GI. Would likely benefit from Palliative medicine consult for repeat admission and declining health.  Best Practice (right click and "Reselect all SmartList Selections" daily)   Diet/type: dysphagia diet (see orders) DVT prophylaxis: prophylactic heparin  GI prophylaxis: PPI Lines: N/A Foley:  N/A Code Status:  full code Last date of multidisciplinary goals of care discussion [pending]  Labs   CBC: Recent Labs  Lab 02/28/23 1641 02/28/23 1726 02/28/23 1739 03/01/23 0606  WBC 9.9  --   --  18.6*  NEUTROABS 7.4  --   --   --   HGB 10.5* 10.9* 11.6* 11.4*  HCT 34.6* 32.0* 34.0* 37.0*  MCV 91.3  --   --  88.1  PLT 121*  --   --  223    Basic Metabolic Panel: Recent  Labs  Lab 02/28/23 1641 02/28/23 1726 02/28/23 1739 03/01/23 0606  NA 137 141 141 136  K 4.0 4.5 4.9 4.4  CL 111  --  110 105  CO2 19*  --   --  19*  GLUCOSE 150*  --  134* 147*  BUN 29*  --  29* 26*  CREATININE 1.95*  --  1.90* 1.62*  CALCIUM 7.3*  --   --  8.4*  MG 1.3*  --   --  1.2*    GFR: Estimated Creatinine Clearance: 37.8 mL/min (A) (by C-G formula based on SCr of 1.62 mg/dL (H)). Recent Labs  Lab 02/28/23 1641 02/28/23 1654 02/28/23 1837 02/28/23 2203 03/01/23 0008 03/01/23 0606  PROCALCITON 0.72  --   --   --   --   --   WBC 9.9  --   --   --   --  18.6*  LATICACIDVEN  --    < > 2.6* 3.4* 4.1* 4.0*   < > = values in this interval not displayed.    Liver Function Tests: Recent Labs  Lab 02/28/23 1641  AST 68*  ALT 41  ALKPHOS 265*  BILITOT 0.5  PROT 6.2*  ALBUMIN 2.2*   No results for input(s): "LIPASE", "AMYLASE" in the last 168 hours. No results for input(s): "AMMONIA" in the last 168 hours.  ABG    Component Value Date/Time   PHART 7.298 (L) 02/28/2023 1726   PCO2ART 33.2 02/28/2023 1726   PO2ART 412 (H) 02/28/2023 1726   HCO3 16.1 (L) 02/28/2023 1726   TCO2 17 (L) 02/28/2023 1739   ACIDBASEDEF 9.0 (H) 02/28/2023 1726   O2SAT 100 02/28/2023 1726     Coagulation Profile: No results for input(s): "INR", "PROTIME" in the last 168 hours.  Cardiac Enzymes: No results for input(s): "CKTOTAL", "CKMB", "CKMBINDEX", "TROPONINI" in the last 168 hours.  HbA1C: Hgb A1c MFr Bld  Date/Time Value Ref Range Status  02/28/2023 10:03 PM 5.0 4.8 - 5.6 % Final    Comment:    (NOTE) Pre diabetes:          5.7%-6.4%  Diabetes:              >6.4%  Glycemic control for   <7.0% adults with diabetes   02/03/2022 03:27 AM 5.1 4.8 - 5.6 % Final    Comment:    (NOTE) Pre diabetes:          5.7%-6.4%  Diabetes:              >6.4%  Glycemic control for   <7.0% adults with diabetes     CBG: Recent Labs  Lab 02/28/23 2058 03/01/23 0717  GLUCAP 111* 142*    Past Medical History:  He,  has a past medical history of Acute respiratory failure (HCC) (01/31/2022), Bilateral shoulder pain (08/20/2012), Bite wound of left hand (09/27/2021), CAD (coronary artery disease), Cardiac arrest (HCC), Community acquired pneumonia (11/07/2011), Critical  limb ischemia with history of revascularization of same extremity (HCC) (04/28/2020), DDD (degenerative disc disease), lumbosacral, DVT (deep venous thrombosis) (HCC) (01/31/2022), Esophageal cancer (HCC), GERD (gastroesophageal reflux disease), History of anemia, History of COVID-19 (05/10/2019), History of critical lower limb ischemia (02/19/2019), Hypertension, Impingement syndrome of shoulder region, Ischemic ulcer of toe of left foot (HCC), Liver mass, Low back pain without sciatica, Lupus (HCC), Multiple fractures of ribs, bilateral, due to CPR trauma, Onychomycosis, Paronychia of great toe, Peripheral vascular disease (HCC), Pressure injury of skin, Pulmonary embolus (HCC), RA (rheumatoid arthritis) (HCC),  Rotator cuff tear (11/04/2014), Sternal fracture, and Stroke (HCC).   Surgical History:   Past Surgical History:  Procedure Laterality Date   ABDOMINAL AORTAGRAM  02/20/2019   ABDOMINAL AORTOGRAM W/LOWER EXTREMITY N/A 02/20/2019   Procedure: ABDOMINAL AORTOGRAM W/LOWER EXTREMITY;  Surgeon: Cephus Shelling, MD;  Location: [facility];  Service: Cardiovascular;  Laterality: N/A;   ACHILLES TENDON SURGERY Bilateral    AMPUTATION Left 05/11/2020   Procedure: LEFT ABOVE KNEE AMPUTATION;  Surgeon: Chuck Hint, MD;  Location: [facility] OR;  Service: Vascular;  Laterality: Left;   ENDARTERECTOMY POPLITEAL Left 05/01/2020   Procedure: ENDARTERECTOMY POPLITEAL;  Surgeon: Maeola Harman, MD;  Location: [facility] OR;  Service: Vascular;  Laterality: Left;   FASCIOTOMY Left 05/01/2020   Procedure: FASCIOTOMY;  Surgeon: Maeola Harman, MD;  Location: [facility] OR;  Service: Vascular;  Laterality: Left;   FEMORAL-POPLITEAL BYPASS GRAFT Left 02/26/2019   Procedure: BYPASS GRAFT FEMORAL-POPLITEAL ARTERY LEFT LEG USING 6mm PROPATEN GRAFT;  Surgeon: Nada Libman, MD;  Location: [facility] OR;  Service: Vascular;  Laterality: Left;   INTRAOPERATIVE ARTERIOGRAM Left 01/22/2020   Procedure: INTRA  OPERATIVE ARTERIOGRAM with administration of thrombolyics in Left femoral to popliteal bypass.;  Surgeon: Cephus Shelling, MD;  Location: [facility] OR;  Service: Vascular;  Laterality: Left;   IR IMAGING GUIDED PORT INSERTION  03/07/2022   LEFT HEART CATH AND CORONARY ANGIOGRAPHY N/A 03/15/2017   Procedure: LEFT HEART CATH AND CORONARY ANGIOGRAPHY;  Surgeon: Yvonne Kendall, MD;  Location: [facility];  Service: Cardiovascular;  Laterality: N/A;   LEFT HEART CATH AND CORONARY ANGIOGRAPHY N/A 02/06/2022   Procedure: LEFT HEART CATH AND CORONARY ANGIOGRAPHY;  Surgeon: Swaziland, Peter M, MD;  Location: [facility] INVASIVE CV LAB;  Service: Cardiovascular;  Laterality: N/A;   LOWER EXTREMITY INTERVENTION Left 04/29/2020   Procedure: LOWER EXTREMITY INTERVENTION- LYSIS;  Surgeon: Cephus Shelling, MD;  Location: [facility];  Service: Cardiovascular;  Laterality: Left;   OSTEOTOMY AND ULNAR SHORTENING Right 07/22/2002   PATCH ANGIOPLASTY Left 05/01/2020   Procedure: PATCH ANGIOPLASTY USING HEMASHIELD PLATINUM FINESSE PATCH;  Surgeon: Maeola Harman, MD;  Location: [facility] OR;  Service: Vascular;  Laterality: Left;   PERIPHERAL VASCULAR ATHERECTOMY  01/23/2020   Procedure: PERIPHERAL VASCULAR ATHERECTOMY;  Surgeon: Cephus Shelling, MD;  Location: [facility];  Service: Cardiovascular;;   PERIPHERAL VASCULAR BALLOON ANGIOPLASTY  01/23/2020   Procedure: PERIPHERAL VASCULAR BALLOON ANGIOPLASTY;  Surgeon: Cephus Shelling, MD;  Location: [facility];  Service: Cardiovascular;;   PERIPHERAL VASCULAR INTERVENTION Left 04/30/2020   Procedure: PERIPHERAL VASCULAR INTERVENTION;  Surgeon: Leonie Douglas, MD;  Location: [facility];  Service: Cardiovascular;  Laterality: Left;   PERIPHERAL VASCULAR THROMBECTOMY N/A 01/23/2020   Procedure: lysis recheck;  Surgeon: Cephus Shelling, MD;  Location: [facility];  Service: Cardiovascular;  Laterality: N/A;  + [place]umb[place]     PERIPHERAL VASCULAR THROMBECTOMY N/A 04/30/2020   Procedure: Lysis Recheck;  Surgeon: Leonie Douglas, MD;  Location: [facility];  Service: Cardiovascular;  Laterality: N/A;   SHOULDER ARTHROSCOPY WITH BICEPSTENOTOMY Right 03/11/2015   Procedure: SHOULDER ARTHROSCOPY WITH BICEPSTENOTOMY;  Surgeon: Loreta Ave, MD;  Location: [facility];  Service: Orthopedics;  Laterality: Right;   SHOULDER ARTHROSCOPY WITH DISTAL CLAVICLE RESECTION Left 11/19/2014   Procedure: SHOULDER ARTHROSCOPY WITH DISTAL CLAVICLE RESECTION;  Surgeon: Mckinley Jewel, MD;  Location: [facility];  Service: Orthopedics;  Laterality: Left;  SHOULDER ARTHROSCOPY WITH ROTATOR CUFF REPAIR AND SUBACROMIAL DECOMPRESSION Left 11/19/2014   Procedure: LEFT SHOULDER ARTHROSCOPY, DEBRIDEMENT DISTAL CLAVICLE EXCISION, ACROMIOPLASTY WITH ROTATOR CUFF REPAIR ;  Surgeon: Mckinley Jewel, MD;  Location: [facility];  Service: Orthopedics;  Laterality: Left;   THROMBECTOMY OF BYPASS GRAFT FEMORAL- POPLITEAL ARTERY Left 05/01/2020   Procedure: THROMBECTOMY OF LOWER EXTREMITY;  Surgeon: Maeola Harman, MD;  Location: [facility] OR;  Service: Vascular;  Laterality: Left;   TRANSFORAMINAL LUMBAR INTERBODY FUSION (TLIF) WITH PEDICLE SCREW FIXATION 1 LEVEL N/A 01/03/2018   Procedure: TRANSFORAMINAL LUMBAR INTERBODY FUSION (TLIF) LUMBAR FIVE-SACRAL ONE;  Surgeon: Venita Lick, MD;  Location: [facility] OR;  Service: Orthopedics;  Laterality: N/A;   ULTRASOUND GUIDANCE FOR VASCULAR ACCESS Right 01/22/2020   Procedure: ULTRASOUND GUIDANCE FOR VASCULAR ACCESS Right Common Femoral Artery.;  Surgeon: Cephus Shelling, MD;  Location: [facility] OR;  Service: Vascular;  Laterality: Right;   VIDEO ASSISTED THORACOSCOPY (VATS)/DECORTICATION Right 11/21/2011   drainage of empyema     Social History:   reports that he quit smoking about 4 years ago. His smoking use included cigarettes. He started smoking about 24 years ago.  He has a 5 pack-year smoking history. He has never used smokeless tobacco. He reports that he does not drink alcohol and does not use drugs.   Family History:  His family history includes Alcohol abuse in his father; Aneurysm in his mother; Heart attack (age of onset: 16) in his father. There is no history of Stroke or Cancer.   Allergies Allergies  Allergen Reactions   Dilaudid [Hydromorphone Hcl] Rash   Ramipril Rash and Other (See Comments)    Per patient, rash was on the right arm and leg only; no angioedema   Tramadol Itching and Rash     Home Medications  Prior to Admission medications   Medication Sig Start Date End Date Taking? Authorizing Provider  albuterol (VENTOLIN HFA) 108 (90 Base) MCG/ACT inhaler INHALE 2 PUFFS INTO THE LUNGS EVERY 6 (SIX) HOURS AS NEEDED FOR WHEEZING OR SHORTNESS OF BREATH. 01/29/23  Yes Ladene Artist, MD  amiodarone (PACERONE) 200 MG tablet Take 1 tablet (200 mg total) by mouth 2 (two) times daily. 09/27/22 09/27/23 Yes Adron Bene, MD  amLODipine (NORVASC) 10 MG tablet Take 1 tablet (10 mg total) by mouth daily. 08/09/22 02/28/23 Yes Carrion-Carrero, Margely, MD  apixaban (ELIQUIS) 5 MG TABS tablet TAKE 1 TABLET (5 MG TOTAL) BY MOUTH 2 (TWO) TIMES DAILY. 02/20/23  Yes Rocky Morel, DO  atorvastatin (LIPITOR) 40 MG tablet Take 1 tablet (40 mg total) by mouth daily at 6 PM. 09/14/22 02/28/23 Yes Rocky Morel, DO  clopidogrel (PLAVIX) 75 MG tablet Take 75 mg by mouth daily. 09/14/22  Yes [provider]  diclofenac Sodium (VOLTAREN) 1 % GEL Apply 2 g topically 4 (four) times daily. 08/09/22  Yes Carrion-Carrero, Karle Starch, MD  DULoxetine (CYMBALTA) 30 MG capsule Take 30 mg by mouth daily as needed (sleep). 06/21/22  Yes [provider]  feeding supplement (ENSURE ENLIVE / ENSURE PLUS) LIQD Take 237 mLs by mouth 3 (three) times daily between meals. 08/09/22  Yes Carrion-Carrero, Margely, MD  fluticasone-salmeterol (ADVAIR HFA) [phone] MCG/ACT  inhaler Inhale 2 puffs into the lungs 2 (two) times daily. 10/03/22  Yes Adron Bene, MD  folic acid (FOLVITE) 1 MG tablet Take 1 mg by mouth daily.   Yes [provider]  gabapentin (NEURONTIN) 300 MG capsule Take 1 capsule (300 mg total) by mouth 2 (two) times daily.  Daily or twice daily if tolerated. 11/09/22  Yes Rocky Morel, DO  hydrOXYzine (ATARAX) 25 MG tablet Take 25 mg by mouth at bedtime. 02/12/23  Yes [provider]  isosorbide mononitrate (IMDUR) 30 MG 24 hr tablet Take 0.5 tablets (15 mg total) by mouth daily. 02/05/23 03/07/23 Yes Trifan, Kermit Balo, MD  lidocaine-prilocaine (EMLA) cream Apply 1 Application topically as directed. Apply to port site 2 hours prior to stick and cover with Press-and-Seal to numb port before access 05/29/22  Yes Ladene Artist, MD  loperamide (IMODIUM A-D) 2 MG tablet Take 2 mg by mouth 4 (four) times daily as needed for diarrhea or loose stools.   Yes [provider]  meloxicam (MOBIC) 7.5 MG tablet Take 1 tablet (7.5 mg total) by mouth daily as needed for pain. 11/30/22 03/30/23 Yes Rocky Morel, DO  [facility] powder Apply 1 Application topically 2 (two) times daily. 02/23/23  Yes [provider]  ondansetron (ZOFRAN) 8 MG tablet START TAKING 72 HOURS AFTER CHEMOTHERAPY TREATMENT. Patient taking differently: Take 8 mg by mouth every 8 (eight) hours as needed for vomiting or nausea. 02/14/23  Yes Ladene Artist, MD  Oxycodone HCl 10 MG TABS Take 10 mg by mouth every 6 (six) hours. 09/14/22  Yes [provider]  potassium chloride (KLOR-CON) 10 MEQ tablet TAKE 2 TABLETS(20 MEQ) BY MOUTH DAILY 02/07/23  Yes Ladene Artist, MD  prochlorperazine (COMPAZINE) 10 MG tablet TAKE 1 TABLET (10 MG TOTAL) BY MOUTH EVERY 6 (SIX) HOURS AS NEEDED. 01/29/23  Yes Ladene Artist, MD  ferrous sulfate 325 (65 FE) MG tablet Take 1 tablet (325 mg total) by mouth daily with breakfast for 100 doses. 11/30/22 03/10/23  Rocky Morel, DO   methotrexate (RHEUMATREX) 2.5 MG tablet Take 3 tablets (7.5 mg total) by mouth once a week. Caution:Chemotherapy. Protect from light. Patient not taking: Reported on 02/28/2023 10/17/22   Rocky Morel, DO     Critical care time: 35 min

## 2023-03-01 NOTE — Progress Notes (Signed)
  Echocardiogram 2D Echocardiogram has been performed.  Leda Roys RDCS 03/01/2023, 11:13 AM

## 2023-03-01 NOTE — Progress Notes (Signed)
Pharmacy Antibiotic Note  [name] [name] is a 62 y.o. male for which pharmacy has been consulted for vancomycin dosing for sepsis.  Patient is also on Cefepime and Flagyl.  Renal function improving, afebrile, WBC uptrending.  Plan: Schedule vanc 750mg  IV Q24H for AUC 514 using SCr 1.62/TBW Cefepime 2gm IV Q12H Flagyl 500mg  IV Q12H per MD Monitor renal fxn, clinical progress, vanc levels as indicated  Weight: 56.6 kg (124 lb 12.5 oz)  Temp (24hrs), Avg:98.6 F (37 C), Min:97.8 F (36.6 C), Max:100.7 F (38.2 C)  Recent Labs  Lab 02/28/23 1641 02/28/23 1654 02/28/23 1739 02/28/23 1837 02/28/23 2203 03/01/23 0008 03/01/23 0606  WBC 9.9  --   --   --   --   --  18.6*  CREATININE 1.95*  --  1.90*  --   --   --  1.62*  LATICACIDVEN  --  2.0*  --  2.6* 3.4* 4.1* 4.0*    Estimated Creatinine Clearance: 37.8 mL/min (A) (by C-G formula based on SCr of 1.62 mg/dL (H)).    Allergies  Allergen Reactions   Dilaudid [Hydromorphone Hcl] Rash   Ramipril Rash and Other (See Comments)    Per patient, rash was on the right arm and leg only; no angioedema   Tramadol Itching and Rash    Vanc 9/4 >> Cefepime 9/4 >> Flagyl 9/4 >>   9/4 MRSA PCR - negative 9/4 BCx - GNR on Gram stain  [name] [name], PharmD, BCPS, BCCCP 03/01/2023, 8:03 AM

## 2023-03-01 NOTE — Consult Note (Signed)
Referring Provider: Dr. Tiffany Kocher Primary Care Physician:  Rocky Morel, DO Primary Gastroenterologist:  Dr. Amada Jupiter  Reason for Consultation:  Esophageal dysmotility, rule out GI/biliary source for sepsis   HPI: [name] [name] is a 62 y.o. male is a 62 year old male with a past medical history of hypertension, coronary artery disease on Plavix, s/p MI/cardiac arrest 01/2022, VT, CVA, PAD, PE/DVT on Eliquis, COPD, Lupus, aspiration pneumonia, GERD and metastatic adenocarcinoma, possible esophageal primary.   He developed a cough and progressive weakness therefore he presented to the ED 02/28/2023 via EMS.  He was hypotensive with BP 60/40 and bradycardic.  He received 1 L nasal saline IV bolus.  EMS noted brief episodes of apnea en route to the ED. On Levo and Vasopressin infusions. Labs in the ED showed a WBC count of 9.9.  Hemoglobin 10.5 which is his baseline level.  Hematocrit 34.6.  Platelet 121.  BUN 29 up from 15.  Creatinine 1.95 up from 0.98.  Albumin 2.2.  Total bili 0.5.  Alk phos 265.  AST 68.  ALT 41.  GGT 197.  SARS coronavirus 2 negative.  Influenza A and B negative.  Blood cultures showed gram-negative rods in aerobic and anaerobic bottles and positive for  Enterobacterales and Klebsiella pneumonia.  Procalcitonin 0.72.  Lactic acid 2.0.  Magnesium 1.3.  BNP 405.6.  Chest x-ray showed increased interstitial coarsening in the right lower lung. Chest CTA negative for PE, showed right upper lobe peripheral groundglass airspace opacities, persistent few scattered peribronchial vascular groundglass airspace opacities possibly representing infection/inflammation versus scarring with adenocarcinoma and interval increase in size of a 7.3 x 5.7 cm gastroesophageal and gastric fundic mass associated fluid-filled esophageal lumen.  Negative head CT.  RUQ sonogram completed today, results pending.  Labs today: WBC 18.6.  Hemoglobin 11.4.  Platelet 223.  Lactic acid 4.1.  BUN 26.  Creatinine  1.62.  Magnesium 1.2.  HIV nonreactive.  He denies having any difficulty swallowing pills, solid foods or liquids.  His RN stated he swallowed his pills and dysphagia to diet without any difficulty.  No heartburn.  He denies having any upper or lower abdominal pain.  He typically passes a normal brown formed stool daily.  No rectal bleeding or black stools.  He denies ever having an EGD or screening colonoscopy.  He smokes cigarettes for 30+ years, quit smoking 6 to 8 months ago.  He denies having any chest pain or shortness of breath at this time.  It is unclear when he last took Eliquis.  He remains on Plavix. No family or friends at the bedside at this time.  He was seen in our outpatient GI clinic 01/09/2023 following his hospitalization with respiratory failure and secondary to aspiration pneumonia.  He was admitted to the hospital 12/27/2022-01/01/2023 with sepsis, likely aspiration pneumonia.  An esophagram gram done during this hospitalization showed esophageal dysmotility. Endoscopic evaluation was not pursued as the patient was considered high risk for anesthesia/procedure complications on Plavix and Eliquis.   PAST IMAGE STUDIES:  02/11/2022 CTA of the chest with a 7.8 x 8.5 cm subtle hypoattenuating mass lesion in segment 4 of the liver.  CTAP at that time with a 8.7 x 7.8 or ill-defined irregular mass in the medial segment of the left liver, 4.5 x 3.4 cm necrotic mass lesion just cranial to the EG junction with metastatic lymphadenopathy in the abdomen pelvis and irregular nodule peritoneal thickening in the lower abdomen and pelvis.  02/11/2022 MRI of the abdomen  with rim-enhancing likely necrotic mass or lymph node conglomerate centered around the GE junction measuring 5.0 x 3.9 cm, large rim-enhancing likely necrotic liver mass centered in the anterior left lobe of the liver measuring 7.8 x 7.2 cm, enlarged gastropathic ligament and retroperitoneal lymph nodes.  02/15/2022 ultrasound-guided liver  biopsy showed adenocarcinoma with extensive necrosis, consistent with a pancreatic, lung or upper GI primary.  03/08/2022 PET scan with hypermetabolic distal esophagus mass, hypermetabolic centrally necrotic hepatic mass, hypermetabolic periportal nodes, exophytic GE junction mass without hypermetabolism  Barium swallow 01/01/2023: 1. Limited study due to patient's limited mobility, within limitation no vestibular penetration or aspiration seen. No true lateral view could be obtained. If there is continued concern for aspiration, dedicated swallow study with speech pathologist should be performed.   2.  Decreased esophageal motility.  Echocardiogram 08/05/2022-LVEF 45-50%, mildly decreased left ventricular function, moderate reduced right ventricular function        Past Medical History:  Diagnosis Date   Acute respiratory failure (HCC) 01/31/2022   Bilateral shoulder pain 08/20/2012   Bite wound of left hand 09/27/2021   CAD (coronary artery disease)    Occluded nondominent RCA managed medically - August 2023   Cardiac arrest [facility])    OOH VF - August 2023   Community acquired pneumonia 11/07/2011   Critical limb ischemia with history of revascularization of same extremity (HCC) 04/28/2020   DDD (degenerative disc disease), lumbosacral     and grade 2 slip   DVT (deep venous thrombosis) (HCC) 01/31/2022   Esophageal cancer (HCC)    Adenocarcinoma with metastasis   GERD (gastroesophageal reflux disease)    History of anemia    History of COVID-19 05/10/2019   History of critical lower limb ischemia 02/19/2019   Hypertension    Impingement syndrome of shoulder region    Ischemic ulcer of toe of left foot (HCC)    Liver mass    Low back pain without sciatica    Lupus (HCC)    Multiple fractures of ribs, bilateral, due to CPR trauma    Onychomycosis    Paronychia of great toe    Peripheral vascular disease (HCC)    Pressure injury of skin    Pulmonary embolus (HCC)    RA  (rheumatoid arthritis) (HCC)    Rotator cuff tear 11/04/2014   Sternal fracture    Stroke [facility])     Past Surgical History:  Procedure Laterality Date   ABDOMINAL AORTAGRAM  02/20/2019   ABDOMINAL AORTOGRAM W/LOWER EXTREMITY N/A 02/20/2019   Procedure: ABDOMINAL AORTOGRAM W/LOWER EXTREMITY;  Surgeon: Cephus Shelling, MD;  [facility];  Service: Cardiovascular;  Laterality: N/A;   ACHILLES TENDON SURGERY Bilateral    AMPUTATION Left 05/11/2020   Procedure: LEFT ABOVE KNEE AMPUTATION;  Surgeon: Chuck Hint, MD;  Location: [facility] OR;  Service: Vascular;  Laterality: Left;   ENDARTERECTOMY POPLITEAL Left 05/01/2020   Procedure: ENDARTERECTOMY POPLITEAL;  Surgeon: Maeola Harman, MD;  Location: [facility] OR;  Service: Vascular;  Laterality: Left;   FASCIOTOMY Left 05/01/2020   Procedure: FASCIOTOMY;  Surgeon: Maeola Harman, MD;  Location: [facility] OR;  Service: Vascular;  Laterality: Left;   FEMORAL-POPLITEAL BYPASS GRAFT Left 02/26/2019   Procedure: BYPASS GRAFT FEMORAL-POPLITEAL ARTERY LEFT LEG USING 6mm PROPATEN GRAFT;  Surgeon: Nada Libman, MD;  Location: [facility] OR;  Service: Vascular;  Laterality: Left;   INTRAOPERATIVE ARTERIOGRAM Left 01/22/2020   Procedure: INTRA OPERATIVE ARTERIOGRAM with administration of thrombolyics in Left femoral to popliteal  bypass.;  Surgeon: Cephus Shelling, MD;  Location: [facility] OR;  Service: Vascular;  Laterality: Left;   IR IMAGING GUIDED PORT INSERTION  03/07/2022   LEFT HEART CATH AND CORONARY ANGIOGRAPHY N/A 03/15/2017   Procedure: LEFT HEART CATH AND CORONARY ANGIOGRAPHY;  Surgeon: Yvonne Kendall, MD;  [facility]: [facility];  Service: Cardiovascular;  Laterality: N/A;   LEFT HEART CATH AND CORONARY ANGIOGRAPHY N/A 02/06/2022   Procedure: LEFT HEART CATH AND CORONARY ANGIOGRAPHY;  Surgeon: Swaziland, Peter M, MD;  Location: [facility] INVASIVE CV LAB;  Service: Cardiovascular;  Laterality: N/A;   LOWER EXTREMITY INTERVENTION  Left 04/29/2020   Procedure: LOWER EXTREMITY INTERVENTION- LYSIS;  Surgeon: Cephus Shelling, MD;  [facility];  Service: Cardiovascular;  Laterality: Left;   OSTEOTOMY AND ULNAR SHORTENING Right 07/22/2002   PATCH ANGIOPLASTY Left 05/01/2020   Procedure: PATCH ANGIOPLASTY USING HEMASHIELD PLATINUM FINESSE PATCH;  Surgeon: Maeola Harman, MD;  Location: [facility] OR;  Service: Vascular;  Laterality: Left;   PERIPHERAL VASCULAR ATHERECTOMY  01/23/2020   Procedure: PERIPHERAL VASCULAR ATHERECTOMY;  Surgeon: Cephus Shelling, MD;  [facility]: [facility];  Service: Cardiovascular;;   PERIPHERAL VASCULAR BALLOON ANGIOPLASTY  01/23/2020   Procedure: PERIPHERAL VASCULAR BALLOON ANGIOPLASTY;  Surgeon: Cephus Shelling, MD;  [facility];  Service: Cardiovascular;;   PERIPHERAL VASCULAR INTERVENTION Left 04/30/2020   Procedure: PERIPHERAL VASCULAR INTERVENTION;  Surgeon: Leonie Douglas, MD;  [facility];  Service: Cardiovascular;  Laterality: Left;   PERIPHERAL VASCULAR THROMBECTOMY N/A 01/23/2020   Procedure: lysis recheck;  Surgeon: Cephus Shelling, MD;  [facility];  Service: Cardiovascular;  Laterality: N/A;  + Penumbra    PERIPHERAL VASCULAR THROMBECTOMY N/A 04/30/2020   Procedure: Lysis Recheck;  Surgeon: Leonie Douglas, MD;  [facility];  Service: Cardiovascular;  Laterality: N/A;   SHOULDER ARTHROSCOPY WITH BICEPSTENOTOMY Right 03/11/2015   Procedure: SHOULDER ARTHROSCOPY WITH BICEPSTENOTOMY;  Surgeon: Loreta Ave, MD;  Location: [facility];  Service: Orthopedics;  Laterality: Right;   SHOULDER ARTHROSCOPY WITH DISTAL CLAVICLE RESECTION Left 11/19/2014   Procedure: SHOULDER ARTHROSCOPY WITH DISTAL CLAVICLE RESECTION;  Surgeon: Mckinley Jewel, MD;  Location: [facility];  Service: Orthopedics;  Laterality: Left;   SHOULDER ARTHROSCOPY WITH ROTATOR CUFF REPAIR AND  SUBACROMIAL DECOMPRESSION Left 11/19/2014   Procedure: LEFT SHOULDER ARTHROSCOPY, DEBRIDEMENT DISTAL CLAVICLE EXCISION, ACROMIOPLASTY WITH ROTATOR CUFF REPAIR ;  Surgeon: Mckinley Jewel, MD;  Location: [facility];  Service: Orthopedics;  Laterality: Left;   THROMBECTOMY OF BYPASS GRAFT FEMORAL- POPLITEAL ARTERY Left 05/01/2020   Procedure: THROMBECTOMY OF LOWER EXTREMITY;  Surgeon: Maeola Harman, MD;  Location: [facility] OR;  Service: Vascular;  Laterality: Left;   TRANSFORAMINAL LUMBAR INTERBODY FUSION (TLIF) WITH PEDICLE SCREW FIXATION 1 LEVEL N/A 01/03/2018   Procedure: TRANSFORAMINAL LUMBAR INTERBODY FUSION (TLIF) LUMBAR FIVE-SACRAL ONE;  Surgeon: Venita Lick, MD;  Location[facility] OR;  Service: Orthopedics;  Laterality: N/A;   ULTRASOUND GUIDANCE FOR VASCULAR ACCESS Right 01/22/2020   Procedure: ULTRASOUND GUIDANCE FOR VASCULAR ACCESS Right Common Femoral Artery.;  Surgeon: Cephus Shelling, MD;  Location: [facility] OR;  Service: Vascular;  Laterality: Right;   VIDEO ASSISTED THORACOSCOPY (VATS)/DECORTICATION Right 11/21/2011   drainage of empyema    Prior to Admission medications   Medication Sig Start Date End Date Taking? Authorizing Provider  albuterol (VENTOLIN HFA) 108 (90 Base) MCG/ACT inhaler INHALE 2 PUFFS INTO THE LUNGS EVERY  6 (SIX) HOURS AS NEEDED FOR WHEEZING OR SHORTNESS OF BREATH. 01/29/23  Yes Ladene Artist, MD  amiodarone (PACERONE) 200 MG tablet Take 1 tablet (200 mg total) by mouth 2 (two) times daily. 09/27/22 09/27/23 Yes Adron Bene, MD  amLODipine (NORVASC) 10 MG tablet Take 1 tablet (10 mg total) by mouth daily. 08/09/22 02/28/23 Yes Carrion-Carrero, Margely, MD  apixaban (ELIQUIS) 5 MG TABS tablet TAKE 1 TABLET (5 MG TOTAL) BY MOUTH 2 (TWO) TIMES DAILY. 02/20/23  Yes Rocky Morel, DO  atorvastatin (LIPITOR) 40 MG tablet Take 1 tablet (40 mg total) by mouth daily at 6 PM. 09/14/22 02/28/23 Yes Rocky Morel, DO  clopidogrel (PLAVIX) 75 MG tablet Take 75 mg by  mouth daily. 09/14/22  Yes [provider]  diclofenac Sodium (VOLTAREN) 1 % GEL Apply 2 g topically 4 (four) times daily. 08/09/22  Yes Carrion-Carrero, Karle Starch, MD  DULoxetine (CYMBALTA) 30 MG capsule Take 30 mg by mouth daily as needed (sleep). 06/21/22  Yes [provider]  feeding supplement (ENSURE ENLIVE / ENSURE PLUS) LIQD Take 237 mLs by mouth 3 (three) times daily between meals. 08/09/22  Yes Carrion-Carrero, Margely, MD  fluticasone-salmeterol (ADVAIR HFA) [phone] MCG/ACT inhaler Inhale 2 puffs into the lungs 2 (two) times daily. 10/03/22  Yes Adron Bene, MD  folic acid (FOLVITE) 1 MG tablet Take 1 mg by mouth daily.   Yes [provider]  gabapentin (NEURONTIN) 300 MG capsule Take 1 capsule (300 mg total) by mouth 2 (two) times daily. Daily or twice daily if tolerated. 11/09/22  Yes Rocky Morel, DO  hydrOXYzine (ATARAX) 25 MG tablet Take 25 mg by mouth at bedtime. 02/12/23  Yes [provider]  isosorbide mononitrate (IMDUR) 30 MG 24 hr tablet Take 0.5 tablets (15 mg total) by mouth daily. 02/05/23 03/07/23 Yes Trifan, Kermit Balo, MD  lidocaine-prilocaine (EMLA) cream Apply 1 Application topically as directed. Apply to port site 2 hours prior to stick and cover with Press-and-Seal to numb port before access 05/29/22  Yes Ladene Artist, MD  loperamide (IMODIUM A-D) 2 MG tablet Take 2 mg by mouth 4 (four) times daily as needed for diarrhea or loose stools.   Yes [provider]  meloxicam (MOBIC) 7.5 MG tablet Take 1 tablet (7.5 mg total) by mouth daily as needed for pain. 11/30/22 03/30/23 Yes Rocky Morel, DO  [facility] powder Apply 1 Application topically 2 (two) times daily. 02/23/23  Yes [provider]  ondansetron (ZOFRAN) 8 MG tablet START TAKING 72 HOURS AFTER CHEMOTHERAPY TREATMENT. Patient taking differently: Take 8 mg by mouth every 8 (eight) hours as needed for vomiting or nausea. 02/14/23  Yes Ladene Artist, MD  Oxycodone HCl  10 MG TABS Take 10 mg by mouth every 6 (six) hours. 09/14/22  Yes [provider]  potassium chloride (KLOR-CON) 10 MEQ tablet TAKE 2 TABLETS(20 MEQ) BY MOUTH DAILY 02/07/23  Yes Ladene Artist, MD  prochlorperazine (COMPAZINE) 10 MG tablet TAKE 1 TABLET (10 MG TOTAL) BY MOUTH EVERY 6 (SIX) HOURS AS NEEDED. 01/29/23  Yes Ladene Artist, MD  ferrous sulfate 325 (65 FE) MG tablet Take 1 tablet (325 mg total) by mouth daily with breakfast for 100 doses. 11/30/22 03/10/23  Rocky Morel, DO  methotrexate (RHEUMATREX) 2.5 MG tablet Take 3 tablets (7.5 mg total) by mouth once a week. Caution:Chemotherapy. Protect from light. Patient not taking: Reported on 02/28/2023 10/17/22   Rocky Morel, DO    Current Facility-Administered Medications  Medication Dose Route  Frequency Provider Last Rate Last Admin   0.9 %  sodium chloride infusion  250 mL Intravenous Continuous Rondel Baton, MD 10 mL/hr at 02/28/23 1725 250 mL at 02/28/23 1725   albuterol (PROVENTIL) (2.5 MG/3ML) 0.083% nebulizer solution 3 mL  3 mL Inhalation Q6H PRN Briant Sites, DO       cefTRIAXone (ROCEPHIN) 2 g in sodium chloride 0.9 % 100 mL IVPB  2 g Intravenous Q24H Agarwala, Daleen Bo, MD       Chlorhexidine Gluconate Cloth 2 % PADS 6 each  6 each Topical Daily Rondel Baton, MD   6 each at 02/28/23 2101   clopidogrel (PLAVIX) tablet 75 mg  75 mg Oral Daily Briant Sites, DO   75 mg at 03/01/23 1032   docusate sodium (COLACE) capsule 100 mg  100 mg Oral BID PRN Briant Sites, DO       DULoxetine (CYMBALTA) DR capsule 30 mg  30 mg Oral Daily PRN Briant Sites, DO       ferrous sulfate tablet 325 mg  325 mg Oral Q breakfast Briant Sites, DO   325 mg at 03/01/23 0758   folic acid (FOLVITE) tablet 1 mg  1 mg Oral Daily Briant Sites, DO   1 mg at 03/01/23 1032   Gerhardt's butt cream   Topical TID Lynnell Catalan, MD       heparin injection 5,000 Units  5,000 Units Subcutaneous Q8H Briant Sites, DO    5,000 Units at 03/01/23 0554   insulin aspart (novoLOG) injection 0-9 Units  0-9 Units Subcutaneous TID WC Briant Sites, DO   1 Units at 03/01/23 5621   lactated ringers infusion   Intravenous Continuous Rondel Baton, MD   Stopped at 03/01/23 3086   lidocaine-prilocaine (EMLA) cream 1 Application  1 Application Topical UD Briant Sites, DO       metroNIDAZOLE (FLAGYL) IVPB 500 mg  500 mg Intravenous Q12H Briant Sites, DO   Paused at 03/01/23 5784   mometasone-formoterol (DULERA) 100-5 MCG/ACT inhaler 2 puff  2 puff Inhalation BID Briant Sites, DO       norepinephrine (LEVOPHED) 4mg  in (0.016 mg/mL) premix infusion  0-40 mcg/min Intravenous Titrated Briant Sites, DO 37.5 mL/hr at 03/01/23 1000 10 mcg/min at 03/01/23 1000   ondansetron (ZOFRAN) injection 4 mg  4 mg Intravenous Q6H PRN Briant Sites, DO   4 mg at 03/01/23 0932   oxyCODONE (Oxy IR/ROXICODONE) immediate release tablet 10 mg  10 mg Oral Q6H Briant Sites, DO   10 mg at 03/01/23 0554   polyethylene glycol (MIRALAX / GLYCOLAX) packet 17 g  17 g Oral Daily PRN Briant Sites, DO       polyethylene glycol (MIRALAX / GLYCOLAX) packet 17 g  17 g Oral Daily Tiffany Kocher, DO   17 g at 03/01/23 1031   prochlorperazine (COMPAZINE) tablet 10 mg  10 mg Oral Q6H PRN Briant Sites, DO       senna-docusate (Senokot-S) tablet 1 tablet  1 tablet Oral BID Tiffany Kocher, DO   1 tablet at 03/01/23 1032   sodium chloride flush (NS) 0.9 % injection 10-40 mL  10-40 mL Intracatheter Q12H Rondel Baton, MD       sodium chloride flush (NS) 0.9 % injection 10-40 mL  10-40 mL Intracatheter PRN Rondel Baton, MD       vasopressin (PITRESSIN) 20 Units in 100 mL (0.2 unit/mL) infusion-*FOR SHOCK*  0.04 Units/min Intravenous Continuous Paliwal, Aditya, MD 12 mL/hr at  03/01/23 1000 0.04 Units/min at 03/01/23 1000    Allergies as of 02/28/2023 - Review Complete 02/28/2023  Allergen Reaction Noted    Dilaudid [hydromorphone hcl] Rash 05/08/2020   Ramipril Rash and Other (See Comments) 06/23/2011   Tramadol Itching and Rash 06/23/2011    Family History  Problem Relation Age of Onset   Heart attack Father 24   Alcohol abuse Father    Aneurysm Mother    Stroke Neg Hx    Cancer Neg Hx     Social History   Socioeconomic History   Marital status: Single    Spouse name: Not on file   Number of children: 0   Years of education: Not on file   Highest education level: Not on file  Occupational History   Occupation: Disabled    Comment: 2/2 RA  Tobacco Use   Smoking status: Former    Current packs/day: 0.00    Average packs/day: 0.3 packs/day for 20.0 years (5.0 ttl pk-yrs)    Types: Cigarettes    Start date: 02/19/1999    Quit date: 02/19/2019    Years since quitting: 4.0   Smokeless tobacco: Never   Tobacco comments:    Patient stated he smokes a couple day and is attempting to quit smoking  Vaping Use   Vaping status: Never Used  Substance and Sexual Activity   Alcohol use: No    Alcohol/week: 0.0 standard drinks of alcohol    Comment: sober 1998   Drug use: No   Sexual activity: Not on file  Other Topics Concern   Not on file  Social History Narrative   On disability since 1992, used to work with city of [place]. Quit drinking in 1990.      Current Social History 11/05/2019        Patient lives by himself most of the time. Sometimes girlfriend's son stays with him in a one level home. There are 3 steps with handrail up to the entrance the patient uses.       Patient's method of transportation is personal truck. This was recently vandalized and patient doesn't have funds to repair at this time.      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      Identified important Relationships are "My girlfriend, Selena Batten."       Pets : American Terrier Archivist), Lab (Roxy)       Interests / Fun: Walk, watch TV       Current  Stressors: "Going through a lot the last 6-7 years; I worry too much about my health." (Discussed IBH, patient not interested at this time.)      Religious / Personal Beliefs: Baptist       L. Ducatte, BSN, RN-BC    Social Determinants of Health   Financial Resource Strain: Low Risk  (11/10/2022)   Overall Financial Resource Strain (CARDIA)    Difficulty of Paying Living Expenses: Not hard at all  Food Insecurity: No Food Insecurity (11/10/2022)   Hunger Vital Sign    Worried About Running Out of Food in the Last Year: Never true    Ran Out of Food in the Last Year: Never true  Transportation Needs: Unmet Transportation Needs (11/10/2022)   PRAPARE - Transportation    Lack of Transportation (Medical): Yes    Lack of Transportation (Non-Medical): Yes  Physical Activity: Insufficiently Active (11/10/2022)   Exercise Vital Sign  Days of Exercise per Week: 2 days    Minutes of Exercise per Session: 10 min  Stress: No Stress Concern Present (11/10/2022)   [employer] of Occupational Health - Occupational Stress Questionnaire    Feeling of Stress : Not at all  Social Connections: Moderately Isolated (11/10/2022)   Social Connection and Isolation Panel [NHANES]    Frequency of Communication with Friends and Family: Twice a week    Frequency of Social Gatherings with Friends and Family: More than three times a week    Attends Religious Services: Never    Database administrator or Organizations: No    Attends Banker Meetings: Never    Marital Status: Living with partner  Intimate Partner Violence: Not At Risk (11/10/2022)   Humiliation, Afraid, Rape, and Kick questionnaire    Fear of Current or Ex-Partner: No    Emotionally Abused: No    Physically Abused: No    Sexually Abused: No    Review of Systems: Gen: Denies fever, sweats or chills. No weight loss.  CV: Denies chest pain, palpitations or edema. Resp: See HPI. No hemoptysis. GI: Denies heartburn, dysphagia,  stomach or lower abdominal pain. No diarrhea or constipation. No rectal bleeding or melena.   GU : Denies urinary burning, blood in urine, increased urinary frequency or incontinence. MS: Denies joint pain, muscles aches or weakness. Derm: Denies rash, itchiness, skin lesions or unhealing ulcers. Psych: Denies depression, anxiety, memory loss or confusion. Heme: Denies easy bruising, bleeding. Neuro:  Denies headaches, dizziness or paresthesias. Endo:  Denies any problems with DM, thyroid or adrenal function.  Physical Exam: Vital signs in last 24 hours: Temp:  [97.8 F (36.6 C)-100.7 F (38.2 C)] 98.3 F (36.8 C) (09/05 0719) Pulse Rate:  [45-75] 51 (09/05 1030) Resp:  [0-30] 13 (09/05 1030) BP: (74-139)/(49-125) 88/72 (09/05 1030) SpO2:  [92 %-100 %] 96 % (09/05 1030) Weight:  [56.6 kg-61.2 kg] 56.6 kg (09/05 0500) Last BM Date :  (PTA) General: 62 year old male chronically ill-appearing in no acute distress. Head:  Normocephalic and atraumatic. Eyes:  No scleral icterus. Conjunctiva pink. Ears:  Normal auditory acuity. Nose:  No deformity, discharge or lesions. Mouth: Absent dentition.  No ulcers or lesions.  Neck:  Supple. No lymphadenopathy or thyromegaly.  Lungs: Breath sounds clear throughout. No wheezes, rhonchi or crackles.  Heart: Bradycardic, no murmurs. Abdomen: Soft, nondistended.  Nontender.  No palpable mass.  Positive bowel sounds all 4 quadrants.  No bruit. Rectal: Deferred. Musculoskeletal:  Symmetrical without gross deformities.  Pulses:  Normal pulses noted. Extremities:  Without clubbing or edema. Neurologic:  Alert to name and place. Skin:  Intact without significant lesions or rashes. Psych:  Alert and cooperative. Normal mood and affect.  Intake/Output from previous day: 09/04 0701 - 09/05 0700 In: 3143.5 [P.O.:120; I.V.:2084.6; IV Piggyback:938.9] Out: 200 [Urine:200] Intake/Output this shift: Total I/O In: 260.7 [I.V.:148.1; IV  Piggyback:112.6] Out: -   Lab Results: Recent Labs    02/28/23 1641 02/28/23 1726 02/28/23 1739 03/01/23 0606  WBC 9.9  --   --  18.6*  HGB 10.5* 10.9* 11.6* 11.4*  HCT 34.6* 32.0* 34.0* 37.0*  PLT 121*  --   --  223   BMET Recent Labs    02/28/23 1641 02/28/23 1726 02/28/23 1739 03/01/23 0606  NA 137 141 141 136  K 4.0 4.5 4.9 4.4  CL 111  --  110 105  CO2 19*  --   --  19*  GLUCOSE 150*  --  134* 147*  BUN 29*  --  29* 26*  CREATININE 1.95*  --  1.90* 1.62*  CALCIUM 7.3*  --   --  8.4*   LFT Recent Labs    02/28/23 1641  PROT 6.2*  ALBUMIN 2.2*  AST 68*  ALT 41  ALKPHOS 265*  BILITOT 0.5   PT/INR No results for input(s): "LABPROT", "INR" in the last 72 hours. Hepatitis Panel No results for input(s): "HEPBSAG", "HCVAB", "HEPAIGM", "HEPBIGM" in the last 72 hours.    Studies/Results: CT Angio Chest PE W and/or Wo Contrast  Result Date: 02/28/2023 CLINICAL DATA:  Pulmonary embolism (PE) suspected, high prob. Questionable sepsis. EXAM: CT ANGIOGRAPHY CHEST WITH CONTRAST TECHNIQUE: Multidetector CT imaging of the chest was performed using the standard protocol during bolus administration of intravenous contrast. Multiplanar CT image reconstructions and MIPs were obtained to evaluate the vascular anatomy. RADIATION DOSE REDUCTION: This exam was performed according to the departmental dose-optimization program which includes automated exposure control, adjustment of the mA and/or kV according to patient size and/or use of iterative reconstruction technique. CONTRAST:  75mL OMNIPAQUE IOHEXOL 350 MG/ML SOLN COMPARISON:  Chest x-ray 02/28/2019 FINDINGS: Cardiovascular: Satisfactory opacification of the pulmonary arteries to the segmental level. No evidence of pulmonary embolism. The main pulmonary artery is normal in caliber. Enlarged heart size. No significant pericardial effusion. The thoracic aorta is normal in caliber. At least moderate atherosclerotic plaque of the  thoracic aorta. At least 3 vessel coronary artery calcifications. Right chest wall accessed Port-A-Cath with tip at superior cavoatrial junction. Mediastinum/Nodes: Esophageal lumen is filled with fluid. No enlarged mediastinal, hilar, or axillary lymph nodes. Thyroid gland and trachea demonstrate no significant findings. Lungs/Pleura: Limited evaluation due to motion artifact. Bilateral lower lobe atelectasis. Slightly less conspicuous right upper lobe peripheral ground-glass airspace opacities. Persistent few scattered peribronchovascular ground-glass airspace opacities. No focal consolidation. Previously identified right lower lobe pulmonary nodule not identified on this study and possibly resolved or underlying atelectasis. No pulmonary nodule. No pulmonary mass. No pleural effusion. No pneumothorax. Upper Abdomen: Incompletely evaluated likely increased in size multiple poorly defined and conglomerative hepatic masses. Pneumobilia. Musculoskeletal: No chest wall abnormality. No suspicious lytic or blastic osseous lesions. No acute displaced fracture. Old healed sternal fracture. Please see separately dictated thoracic spine CT 02/28/2023. Review of the MIP images confirms the above findings. IMPRESSION: 1. No pulmonary embolus. 2. Slightly less conspicuous right upper lobe peripheral ground-glass airspace opacities. Persistent few scattered peribronchovascular ground-glass airspace opacities. Findings may represent infection/inflammation or scarring with adenocarcinoma not excluded. Recommend continued follow-up. 3. Incompletely evaluated likely increased in size multiple poorly defined and conglomerative hepatic masses. 4. Interval increase in size of a 7.3 x 5.7 cm (from 4.6 x 3.7) gastroesophageal and gastric fundus mass. Associated fluid-filled esophageal lumen. No definite dilatation to suggest high-grade obstruction. 5. Aortic Atherosclerosis (ICD10-I70.0) and at least three-vessel coronary calcification.  Electronically Signed   By: Tish Frederickson M.D.   On: 02/28/2023 19:30   CT T-SPINE NO CHARGE  Result Date: 02/28/2023 CLINICAL DATA:  fall on thinners back pain EXAM: CT THORACIC SPINE WITHOUT CONTRAST TECHNIQUE: Multidetector CT images of the thoracic were obtained using the standard protocol without intravenous contrast. RADIATION DOSE REDUCTION: This exam was performed according to the departmental dose-optimization program which includes automated exposure control, adjustment of the mA and/or kV according to patient size and/or use of iterative reconstruction technique. COMPARISON:  CT angiography chest 02/11/2022 FINDINGS: Alignment: Exaggerated kyphotic curvature along the upper midthoracic spine likely positional. Otherwise normal  alignment. Vertebrae: No acute fracture or focal pathologic process. Paraspinal and other soft tissues: Negative. Disc levels: Maintained. Other: Please see separately dictated CT angiography chest 02/28/2023. IMPRESSION: No acute displaced fracture or traumatic listhesis of the thoracic spine. Electronically Signed   By: Tish Frederickson M.D.   On: 02/28/2023 19:15   CT Head Wo Contrast  Result Date: 02/28/2023 CLINICAL DATA:  head trauma on AC; fall on thinners EXAM: CT HEAD WITHOUT CONTRAST CT CERVICAL SPINE WITHOUT CONTRAST TECHNIQUE: Multidetector CT imaging of the head and cervical spine was performed following the standard protocol without intravenous contrast. Multiplanar CT image reconstructions of the cervical spine were also generated. RADIATION DOSE REDUCTION: This exam was performed according to the departmental dose-optimization program which includes automated exposure control, adjustment of the mA and/or kV according to patient size and/or use of iterative reconstruction technique. COMPARISON:  CT head and C-spine 04/29/2022 FINDINGS: CT HEAD FINDINGS Brain: Chronic left occipital infarction. Patchy and confluent areas of decreased attenuation are noted  throughout the deep and periventricular white matter of the cerebral hemispheres bilaterally, compatible with chronic microvascular ischemic disease. No evidence of large-territorial acute infarction. No parenchymal hemorrhage. No mass lesion. No extra-axial collection. No mass effect or midline shift. No hydrocephalus. Basilar cisterns are patent. Vascular: No hyperdense vessel. Atherosclerotic calcifications are present within the cavernous internal carotid and vertebral arteries. Skull: No acute fracture or focal lesion. Sinuses/Orbits: Paranasal sinuses and mastoid air cells are clear. The orbits are unremarkable. Other: None. CT CERVICAL SPINE FINDINGS Alignment: Normal. Skull base and vertebrae: Multilevel mild-to-moderate degenerative changes of the spine. No associated severe osseous neural foraminal stenosis. No acute fracture. No aggressive appearing focal osseous lesion or focal pathologic process. Soft tissues and spinal canal: No prevertebral fluid or swelling. No visible canal hematoma. Upper chest: Unremarkable. Other: Atherosclerotic plaque of the carotid arteries within neck. Partially visualized right central venous catheter. IMPRESSION: 1. No acute intracranial abnormality. 2. No acute displaced fracture or traumatic listhesis of the cervical spine. Electronically Signed   By: Tish Frederickson M.D.   On: 02/28/2023 19:06   CT Cervical Spine Wo Contrast  Result Date: 02/28/2023 CLINICAL DATA:  head trauma on AC; fall on thinners EXAM: CT HEAD WITHOUT CONTRAST CT CERVICAL SPINE WITHOUT CONTRAST TECHNIQUE: Multidetector CT imaging of the head and cervical spine was performed following the standard protocol without intravenous contrast. Multiplanar CT image reconstructions of the cervical spine were also generated. RADIATION DOSE REDUCTION: This exam was performed according to the departmental dose-optimization program which includes automated exposure control, adjustment of the mA and/or kV  according to patient size and/or use of iterative reconstruction technique. COMPARISON:  CT head and C-spine 04/29/2022 FINDINGS: CT HEAD FINDINGS Brain: Chronic left occipital infarction. Patchy and confluent areas of decreased attenuation are noted throughout the deep and periventricular white matter of the cerebral hemispheres bilaterally, compatible with chronic microvascular ischemic disease. No evidence of large-territorial acute infarction. No parenchymal hemorrhage. No mass lesion. No extra-axial collection. No mass effect or midline shift. No hydrocephalus. Basilar cisterns are patent. Vascular: No hyperdense vessel. Atherosclerotic calcifications are present within the cavernous internal carotid and vertebral arteries. Skull: No acute fracture or focal lesion. Sinuses/Orbits: Paranasal sinuses and mastoid air cells are clear. The orbits are unremarkable. Other: None. CT CERVICAL SPINE FINDINGS Alignment: Normal. Skull base and vertebrae: Multilevel mild-to-moderate degenerative changes of the spine. No associated severe osseous neural foraminal stenosis. No acute fracture. No aggressive appearing focal osseous lesion or focal pathologic process. Soft tissues  and spinal canal: No prevertebral fluid or swelling. No visible canal hematoma. Upper chest: Unremarkable. Other: Atherosclerotic plaque of the carotid arteries within neck. Partially visualized right central venous catheter. IMPRESSION: 1. No acute intracranial abnormality. 2. No acute displaced fracture or traumatic listhesis of the cervical spine. Electronically Signed   By: Tish Frederickson M.D.   On: 02/28/2023 19:06   DG Chest Port 1 View  Result Date: 02/28/2023 CLINICAL DATA:  Questionable sepsis-evaluate for abnormality EXAM: PORTABLE CHEST 1 VIEW COMPARISON:  02/05/2023 FINDINGS: Right IJ Port-A-Cath with tip in the right atrium. Stable cardiomediastinal silhouette. Aortic atherosclerotic calcification. Hyperinflation. Increased  interstitial coarsening in the right lower lung compared with 02/05/2023. No focal consolidation, pleural effusion, or pneumothorax. No displaced rib fractures. IMPRESSION: Increased interstitial coarsening in the right lower lung compared to 02/05/2023. Findings may be due to volume loss, infection, or edema. Electronically Signed   By: Minerva Fester M.D.   On: 02/28/2023 18:23    IMPRESSION/PLAN:  62 year old male admitted to the hospital with generalized weakness, cough and septic shock. On Levophed and Vasopressin gtts. Blood cultures positive for Klebsiella and Enterobacter bacteremia. On Ceftriaxone and Flagyl IV.  Low suspicion for a ascending cholangitis as etiology for bacteremia. Alk phos level is elevated secondary to liver metastasis, unlikely biliary obstruction with normal T. Bili level. 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 Truett Perna. S/P ultrasound liver biopsy 02/15/2022 showed adenocarcinoma with extensive necrosis, consistent with a pancreatic, lung or upper GI primary. Chest CTA 02/28/2023 showed interval increase in size of a 7.3 x 5.7 cm gastroesophageal and gastric fundic mass associated fluid-filled esophageal lumen.  -EGD deferred as findings would not change patient's outcome in setting of known adenocarcinoma -Recommend palliative care consult to establish goals of care -Await further recommendations per Dr. Adela Lank  Esophageal dysmotility, likely associated with GE/gastric mass -See plan above  -Dysphagia diet as tolerated   Chronic anemia on ferrous sulfate daily.  No overt GI bleeding.  History of aspiration pneumonia 12/2022, possibly secondary to esophageal dysmotility  DVT/PE 2023 on Heparin  injections SQ  CAD s/p MI/cardiac arrest 01/2022 on Plavix   PAD s/p AKA 2021   Malachi Carl [facility]  03/01/2023, 12:44PM

## 2023-03-02 ENCOUNTER — Encounter (HOSPITAL_COMMUNITY): Payer: Self-pay | Admitting: Critical Care Medicine

## 2023-03-02 DIAGNOSIS — Z7189 Other specified counseling: Secondary | ICD-10-CM | POA: Diagnosis not present

## 2023-03-02 DIAGNOSIS — R6521 Severe sepsis with septic shock: Secondary | ICD-10-CM | POA: Diagnosis not present

## 2023-03-02 DIAGNOSIS — Z515 Encounter for palliative care: Secondary | ICD-10-CM

## 2023-03-02 DIAGNOSIS — R579 Shock, unspecified: Secondary | ICD-10-CM | POA: Diagnosis not present

## 2023-03-02 DIAGNOSIS — A419 Sepsis, unspecified organism: Secondary | ICD-10-CM | POA: Diagnosis not present

## 2023-03-02 LAB — GLUCOSE, CAPILLARY
Glucose-Capillary: 104 mg/dL — ABNORMAL HIGH (ref 70–99)
Glucose-Capillary: 103 mg/dL — ABNORMAL HIGH (ref 70–99)
Glucose-Capillary: 111 mg/dL — ABNORMAL HIGH (ref 70–99)
Glucose-Capillary: 108 mg/dL — ABNORMAL HIGH (ref 70–99)

## 2023-03-02 LAB — LEGIONELLA PNEUMOPHILA SEROGP 1 UR AG: L. pneumophila Serogp 1 Ur Ag: NEGATIVE

## 2023-03-02 MED ORDER — BISACODYL 10 MG RE SUPP: 10.0000 mg | Freq: Every day | RECTAL | Status: DC | PRN

## 2023-03-02 MED ORDER — ENSURE ENLIVE PO LIQD
237.0000 mL | Freq: Three times a day (TID) | ORAL | Status: DC
  Administered 2023-03-02 – 2023-03-07 (×15): 237 mL via ORAL

## 2023-03-02 NOTE — Progress Notes (Signed)
NAME:  [name] [name], MRN:  [id number], DOB:  1961/04/29, LOS: 2 ADMISSION DATE:  02/28/2023, CONSULTATION DATE:  02/28/2023 REFERRING MD:  EDP, CHIEF COMPLAINT:  Septic Shock   History of Present Illness:  62 y.o male presented on 9/4 via EMS 2/2 AMS and fall, noted to be hypotensive and bradycardic. Admitted to ICU for pressor requirement, did not have respiratory distress. Found to have Klebsiella bacteremia and currently on IV antibiotics. GI unable to identify source, and do not see need for other interventions at this time- signed off. Still requiring minor pressor support. He met with Oncology yesterday, and no change in goals of care.  Pertinent  Medical History  Recent admission for sepsis 2/2 aspiration pna 12/2022 Metastatic adenocarcinoma likely esophageal cancer stage IV on chemo Esophageal dysmotility RH failure with h/o MI 01/2022 DVT/PE 12/2021 on eliquis PAD s/p AKA 2021 RA H/o COPD  Significant Hospital Events: Including procedures, antibiotic start and stop dates in addition to other pertinent events   Admitted to ICU on vasopressors 9/4  9/5 Klebsiella bacteremia. Transitioned to CTX and Flagyl.  Interim History / Subjective:  Patient states he feels close to his baseline.  Objective   Blood pressure 101/72, pulse 67, temperature 98.3 F (36.8 C), temperature source Oral, resp. rate 15, weight 56.6 kg, SpO2 93%.        Intake/Output Summary (Last 24 hours) at 03/02/2023 0817 Last data filed at 03/02/2023 0600 Gross per 24 hour  Intake 1456.14 ml  Output 400 ml  Net 1056.14 ml   Filed Weights   02/28/23 2045 03/01/23 0500  Weight: 61.2 kg 56.6 kg    Examination: General: NAD, chronically ill-appearing Lungs: CTAB, normal WOB on RA Cardiovascular: RRR, no murmurs Abdomen: Soft, not tender, not distended. BS + Extremities: No edema, moving all 4 extremities Neuro: A&Ox4  Resolved Hospital Problem list    Assessment & Plan:  Septic Shock   Hypotension Unclear source of Klebsiella bacteremia. RUQ U/S without biliary obstruction, GI signed off. D/C flagyl without evidence of anaerobic involvement. Still requiring minor pressor support- could consider Midodrine. Improving. Will step down to oral abx once hemodynamically stable, would recommend Cefdinir. -Cefepime (9/4-) -Norepinephrine + vasopressin titrate MAP >65 -Repeat lactic acid  Right-sided CHF ECHO with LVEF of 55-60%. Now showing severely reduced RV systolic function, with severely dilated right atria. Will D/C IVF. Spoke with [facility] they will see him. -Consult Cardiology for cardiac optimization  Bradycardia H/o afib.  -Replete Mag/Ca -Tele -Hold home amio   AKI Metabolic acidosis Lactic acidosis Improving with fluid resustiation.  -D/C mIVF -Encourage PO  Metastatic cancer, presumed esophageal No change in goals of care. Oncology plans to see again. GI signed off. Would benefit from multi-disciplinary approach for patient to maintain some level of independence for as long as he can. Ultimately, prognosis of his metastatic cancer is poor.  -PT/OT  Best Practice (right click and "Reselect all SmartList Selections" daily)   Diet/type: dysphagia diet (see orders) DVT prophylaxis: prophylactic heparin  GI prophylaxis: PPI Lines: N/A Foley:  N/A Code Status:  full code Last date of multidisciplinary goals of care discussion [03/01/2023]  Labs   CBC: Recent Labs  Lab 02/28/23 1641 02/28/23 1726 02/28/23 1739 03/01/23 0606  WBC 9.9  --   --  18.6*  NEUTROABS 7.4  --   --   --   HGB 10.5* 10.9* 11.6* 11.4*  HCT 34.6* 32.0* 34.0* 37.0*  MCV 91.3  --   --  88.1  PLT 121*  --   --  223    Basic Metabolic Panel: Recent Labs  Lab 02/28/23 1641 02/28/23 1726 02/28/23 1739 03/01/23 0606  NA 137 141 141 136  K 4.0 4.5 4.9 4.4  CL 111  --  110 105  CO2 19*  --   --  19*  GLUCOSE 150*  --  134* 147*  BUN 29*  --  29* 26*  CREATININE 1.95*  --   1.90* 1.62*  CALCIUM 7.3*  --   --  8.4*  MG 1.3*  --   --  1.2*   GFR: Estimated Creatinine Clearance: 37.8 mL/min (A) (by C-G formula based on SCr of 1.62 mg/dL (H)). Recent Labs  Lab 02/28/23 1641 02/28/23 1654 02/28/23 2203 03/01/23 0008 03/01/23 0606 03/01/23 1346  PROCALCITON 0.72  --   --   --   --   --   WBC 9.9  --   --   --  18.6*  --   LATICACIDVEN  --    < > 3.4* 4.1* 4.0* 2.3*   < > = values in this interval not displayed.    Liver Function Tests: Recent Labs  Lab 02/28/23 1641  AST 68*  ALT 41  ALKPHOS 265*  BILITOT 0.5  PROT 6.2*  ALBUMIN 2.2*   No results for input(s): "LIPASE", "AMYLASE" in the last 168 hours. No results for input(s): "AMMONIA" in the last 168 hours.  ABG    Component Value Date/Time   PHART 7.298 (L) 02/28/2023 1726   PCO2ART 33.2 02/28/2023 1726   PO2ART 412 (H) 02/28/2023 1726   HCO3 16.1 (L) 02/28/2023 1726   TCO2 17 (L) 02/28/2023 1739   ACIDBASEDEF 9.0 (H) 02/28/2023 1726   O2SAT 100 02/28/2023 1726     Coagulation Profile: No results for input(s): "INR", "PROTIME" in the last 168 hours.  Cardiac Enzymes: No results for input(s): "CKTOTAL", "CKMB", "CKMBINDEX", "TROPONINI" in the last 168 hours.  HbA1C: Hgb A1c MFr Bld  Date/Time Value Ref Range Status  02/28/2023 10:03 PM 5.0 4.8 - 5.6 % Final    Comment:    (NOTE) Pre diabetes:          5.7%-6.4%  Diabetes:              >6.4%  Glycemic control for   <7.0% adults with diabetes   02/03/2022 03:27 AM 5.1 4.8 - 5.6 % Final    Comment:    (NOTE) Pre diabetes:          5.7%-6.4%  Diabetes:              >6.4%  Glycemic control for   <7.0% adults with diabetes     CBG: Recent Labs  Lab 03/01/23 0717 03/01/23 1121 03/01/23 1721 03/01/23 2315 03/02/23 0748  GLUCAP 142* 124* 147* 112* 104*    Past Medical History:  He,  has a past medical history of Acute respiratory failure (HCC) (01/31/2022), Bilateral shoulder pain (08/20/2012), Bite wound of  left hand (09/27/2021), CAD (coronary artery disease), Cardiac arrest (HCC), Community acquired pneumonia (11/07/2011), Critical limb ischemia with history of revascularization of same extremity (HCC) (04/28/2020), DDD (degenerative disc disease), lumbosacral, DVT (deep venous thrombosis) (HCC) (01/31/2022), Esophageal cancer (HCC), GERD (gastroesophageal reflux disease), History of anemia, History of COVID-19 (05/10/2019), History of critical lower limb ischemia (02/19/2019), Hypertension, Impingement syndrome of shoulder region, Ischemic ulcer of toe of left foot (HCC), Liver mass, Low back pain without sciatica, Lupus (HCC), Multiple fractures of ribs,  bilateral, due to CPR trauma, Onychomycosis, Paronychia of great toe, Peripheral vascular disease (HCC), Pressure injury of skin, Pulmonary embolus (HCC), RA (rheumatoid arthritis) (HCC), Rotator cuff tear (11/04/2014), Sternal fracture, and Stroke (HCC).   Surgical History:   Past Surgical History:  Procedure Laterality Date   ABDOMINAL AORTAGRAM  02/20/2019   ABDOMINAL AORTOGRAM W/LOWER EXTREMITY N/A 02/20/2019   Procedure: ABDOMINAL AORTOGRAM W/LOWER EXTREMITY;  Surgeon: Cephus Shelling, MD;  Location: [facility];  Service: Cardiovascular;  Laterality: N/A;   ACHILLES TENDON SURGERY Bilateral    AMPUTATION Left 05/11/2020   Procedure: LEFT ABOVE KNEE AMPUTATION;  Surgeon: Chuck Hint, MD;  Location: [facility] OR;  Service: Vascular;  Laterality: Left;   ENDARTERECTOMY POPLITEAL Left 05/01/2020   Procedure: ENDARTERECTOMY POPLITEAL;  Surgeon: Maeola Harman, MD;  Location[facility] OR;  Service: Vascular;  Laterality: Left;   FASCIOTOMY Left 05/01/2020   Procedure: FASCIOTOMY;  Surgeon: Maeola Harman, MD;  Location: [facility] OR;  Service: Vascular;  Laterality: Left;   FEMORAL-POPLITEAL BYPASS GRAFT Left 02/26/2019   Procedure: BYPASS GRAFT FEMORAL-POPLITEAL ARTERY LEFT LEG USING 6mm PROPATEN GRAFT;  Surgeon: Nada Libman, MD;  Location: [facility] OR;  Service: Vascular;  Laterality: Left;   INTRAOPERATIVE ARTERIOGRAM Left 01/22/2020   Procedure: INTRA OPERATIVE ARTERIOGRAM with administration of thrombolyics in Left femoral to popliteal bypass.;  Surgeon: Cephus Shelling, MD;  Location: [facility] OR;  Service: Vascular;  Laterality: Left;   IR IMAGING GUIDED PORT INSERTION  03/07/2022   LEFT HEART CATH AND CORONARY ANGIOGRAPHY N/A 03/15/2017   Procedure: LEFT HEART CATH AND CORONARY ANGIOGRAPHY;  Surgeon: Yvonne Kendall, MD;  Location: [facility];  Service: Cardiovascular;  Laterality: N/A;   LEFT HEART CATH AND CORONARY ANGIOGRAPHY N/A 02/06/2022   Procedure: LEFT HEART CATH AND CORONARY ANGIOGRAPHY;  Surgeon: Swaziland, Peter M, MD;  Location[facility] INVASIVE CV LAB;  Service: Cardiovascular;  Laterality: N/A;   LOWER EXTREMITY INTERVENTION Left 04/29/2020   Procedure: LOWER EXTREMITY INTERVENTION- LYSIS;  Surgeon: Cephus Shelling, MD;  Location: [facility];  Service: Cardiovascular;  Laterality: Left;   OSTEOTOMY AND ULNAR SHORTENING Right 07/22/2002   PATCH ANGIOPLASTY Left 05/01/2020   Procedure: PATCH ANGIOPLASTY USING HEMASHIELD PLATINUM FINESSE PATCH;  Surgeon: Maeola Harman, MD;  Location: [facility] OR;  Service: Vascular;  Laterality: Left;   PERIPHERAL VASCULAR ATHERECTOMY  01/23/2020   Procedure: PERIPHERAL VASCULAR ATHERECTOMY;  Surgeon: Cephus Shelling, MD;  Location: [facility];  Service: Cardiovascular;;   PERIPHERAL VASCULAR BALLOON ANGIOPLASTY  01/23/2020   Procedure: PERIPHERAL VASCULAR BALLOON ANGIOPLASTY;  Surgeon: Cephus Shelling, MD;  Location: [facility];  Service: Cardiovascular;;   PERIPHERAL VASCULAR INTERVENTION Left 04/30/2020   Procedure: PERIPHERAL VASCULAR INTERVENTION;  Surgeon: Leonie Douglas, MD;  Location: [facility];  Service: Cardiovascular;  Laterality: Left;   PERIPHERAL VASCULAR THROMBECTOMY N/A 01/23/2020   Procedure: lysis recheck;   Surgeon: Cephus Shelling, MD;  Location: [facility];  Service: Cardiovascular;  Laterality: N/A;  + [place]    PERIPHERAL VASCULAR THROMBECTOMY N/A 04/30/2020   Procedure: Lysis Recheck;  Surgeon: Leonie Douglas, MD;  Location: [facility];  Service: Cardiovascular;  Laterality: N/A;   SHOULDER ARTHROSCOPY WITH BICEPSTENOTOMY Right 03/11/2015   Procedure: SHOULDER ARTHROSCOPY WITH BICEPSTENOTOMY;  Surgeon: Loreta Ave, MD;  Location: [facility];  Service: Orthopedics;  Laterality: Right;   SHOULDER ARTHROSCOPY WITH DISTAL CLAVICLE RESECTION Left 11/19/2014  Procedure: SHOULDER ARTHROSCOPY WITH DISTAL CLAVICLE RESECTION;  Surgeon: Mckinley Jewel, MD;  Location: [facility];  Service: Orthopedics;  Laterality: Left;   SHOULDER ARTHROSCOPY WITH ROTATOR CUFF REPAIR AND SUBACROMIAL DECOMPRESSION Left 11/19/2014   Procedure: LEFT SHOULDER ARTHROSCOPY, DEBRIDEMENT DISTAL CLAVICLE EXCISION, ACROMIOPLASTY WITH ROTATOR CUFF REPAIR ;  Surgeon: Mckinley Jewel, MD;  Location: [facility];  Service: Orthopedics;  Laterality: Left;   THROMBECTOMY OF BYPASS GRAFT FEMORAL- POPLITEAL ARTERY Left 05/01/2020   Procedure: THROMBECTOMY OF LOWER EXTREMITY;  Surgeon: Maeola Harman, MD;  Location: [facility] OR;  Service: Vascular;  Laterality: Left;   TRANSFORAMINAL LUMBAR INTERBODY FUSION (TLIF) WITH PEDICLE SCREW FIXATION 1 LEVEL N/A 01/03/2018   Procedure: TRANSFORAMINAL LUMBAR INTERBODY FUSION (TLIF) LUMBAR FIVE-SACRAL ONE;  Surgeon: Venita Lick, MD;  Location: [facility] OR;  Service: Orthopedics;  Laterality: N/A;   ULTRASOUND GUIDANCE FOR VASCULAR ACCESS Right 01/22/2020   Procedure: ULTRASOUND GUIDANCE FOR VASCULAR ACCESS Right Common Femoral Artery.;  Surgeon: Cephus Shelling, MD;  Location: [facility] OR;  Service: Vascular;  Laterality: Right;   VIDEO ASSISTED THORACOSCOPY (VATS)/DECORTICATION Right 11/21/2011   drainage of empyema     Social History:    reports that he quit smoking about 4 years ago. His smoking use included cigarettes. He started smoking about 24 years ago. He has a 5 pack-year smoking history. He has never used smokeless tobacco. He reports that he does not drink alcohol and does not use drugs.   Family History:  His family history includes Alcohol abuse in his father; Aneurysm in his mother; Heart attack (age of onset: 109) in his father. There is no history of Stroke or Cancer.   Allergies Allergies  Allergen Reactions   Dilaudid [Hydromorphone Hcl] Rash   Ramipril Rash and Other (See Comments)    Per patient, rash was on the right arm and leg only; no angioedema   Tramadol Itching and Rash     Home Medications  Prior to Admission medications   Medication Sig Start Date End Date Taking? Authorizing Provider  albuterol (VENTOLIN HFA) 108 (90 Base) MCG/ACT inhaler INHALE 2 PUFFS INTO THE LUNGS EVERY 6 (SIX) HOURS AS NEEDED FOR WHEEZING OR SHORTNESS OF BREATH. 01/29/23  Yes Ladene Artist, MD  amiodarone (PACERONE) 200 MG tablet Take 1 tablet (200 mg total) by mouth 2 (two) times daily. 09/27/22 09/27/23 Yes Adron Bene, MD  amLODipine (NORVASC) 10 MG tablet Take 1 tablet (10 mg total) by mouth daily. 08/09/22 02/28/23 Yes Carrion-Carrero, Margely, MD  apixaban (ELIQUIS) 5 MG TABS tablet TAKE 1 TABLET (5 MG TOTAL) BY MOUTH 2 (TWO) TIMES DAILY. 02/20/23  Yes Rocky Morel, DO  atorvastatin (LIPITOR) 40 MG tablet Take 1 tablet (40 mg total) by mouth daily at 6 PM. 09/14/22 02/28/23 Yes Rocky Morel, DO  clopidogrel (PLAVIX) 75 MG tablet Take 75 mg by mouth daily. 09/14/22  Yes [provider]  diclofenac Sodium (VOLTAREN) 1 % GEL Apply 2 g topically 4 (four) times daily. 08/09/22  Yes Carrion-Carrero, Karle Starch, MD  DULoxetine (CYMBALTA) 30 MG capsule Take 30 mg by mouth daily as needed (sleep). 06/21/22  Yes [provider]  feeding supplement (ENSURE ENLIVE / ENSURE PLUS) LIQD Take 237 mLs by mouth 3 (three)  times daily between meals. 08/09/22  Yes Carrion-Carrero, Margely, MD  fluticasone-salmeterol (ADVAIR HFA) [phone] MCG/ACT inhaler Inhale 2 puffs into the lungs 2 (two) times daily. 10/03/22  Yes Adron Bene, MD  folic acid (FOLVITE) 1 MG tablet Take 1 mg by  mouth daily.   Yes [provider]  gabapentin (NEURONTIN) 300 MG capsule Take 1 capsule (300 mg total) by mouth 2 (two) times daily. Daily or twice daily if tolerated. 11/09/22  Yes Rocky Morel, DO  hydrOXYzine (ATARAX) 25 MG tablet Take 25 mg by mouth at bedtime. 02/12/23  Yes [provider]  isosorbide mononitrate (IMDUR) 30 MG 24 hr tablet Take 0.5 tablets (15 mg total) by mouth daily. 02/05/23 03/07/23 Yes Trifan, Kermit Balo, MD  lidocaine-prilocaine (EMLA) cream Apply 1 Application topically as directed. Apply to port site 2 hours prior to stick and cover with Press-and-Seal to numb port before access 05/29/22  Yes Ladene Artist, MD  loperamide (IMODIUM A-D) 2 MG tablet Take 2 mg by mouth 4 (four) times daily as needed for diarrhea or loose stools.   Yes [provider]  meloxicam (MOBIC) 7.5 MG tablet Take 1 tablet (7.5 mg total) by mouth daily as needed for pain. 11/30/22 03/30/23 Yes Rocky Morel, DO  [facility] powder Apply 1 Application topically 2 (two) times daily. 02/23/23  Yes [provider]  ondansetron (ZOFRAN) 8 MG tablet START TAKING 72 HOURS AFTER CHEMOTHERAPY TREATMENT. Patient taking differently: Take 8 mg by mouth every 8 (eight) hours as needed for vomiting or nausea. 02/14/23  Yes Ladene Artist, MD  Oxycodone HCl 10 MG TABS Take 10 mg by mouth every 6 (six) hours. 09/14/22  Yes [provider]  potassium chloride (KLOR-CON) 10 MEQ tablet TAKE 2 TABLETS(20 MEQ) BY MOUTH DAILY 02/07/23  Yes Ladene Artist, MD  prochlorperazine (COMPAZINE) 10 MG tablet TAKE 1 TABLET (10 MG TOTAL) BY MOUTH EVERY 6 (SIX) HOURS AS NEEDED. 01/29/23  Yes Ladene Artist, MD  ferrous sulfate 325 (65 FE) MG  tablet Take 1 tablet (325 mg total) by mouth daily with breakfast for 100 doses. 11/30/22 03/10/23  Rocky Morel, DO  methotrexate (RHEUMATREX) 2.5 MG tablet Take 3 tablets (7.5 mg total) by mouth once a week. Caution:Chemotherapy. Protect from light. Patient not taking: Reported on 02/28/2023 10/17/22   Rocky Morel, DO     Critical care time: 35 min

## 2023-03-02 NOTE — Plan of Care (Signed)

## 2023-03-02 NOTE — Consult Note (Addendum)
Cardiology Consultation   Patient ID: [name] [name] MRN: [id number]; DOB: [birth year]  Admit date: 02/28/2023 Date of Consult: 03/02/2023  PCP:  Rocky Morel, DO   [facility] Providers Cardiologist:  Rollene Rotunda, MD  Electrophysiologist:  Lewayne Bunting, MD  {    Patient Profile:   [name] [name] is a 62 y.o. male with a hx of CAD (occluded small nondominant RCA ), VF arrest in the setting of STEMI 01/2022, sustained VT in the setting of hypokalemia/hypomagnesemia/prolonged QT 07/2022, cardiomyopathy, metastatic adenocarcinoma of the esophagus s/p chemotherapy, HTN,  DVT/PE 2023 on Eliquis, left AKA, anemia, COPD, who is being seen 03/02/2023 for the evaluation of CHF at the request of Dr Denese Killings.  History of Present Illness:   [name] [name] with above PMH who presented to the ER 02/28/23 via EMS transfer for altered mental status.  Reportedly, he has not been doing well for 24 hours prior to current admission.  He had a temperature of 100 F, has been quite weak and unable to hold himself up for meals, but did not want to visit the ER due to oncology appointment on 03/01/2023 for chemotherapy. He had a fall at home which resulted some back pain.  Patient's home health nurse called EMS when he was discovered with hypotension and fever. He offered no complaints during initial admission. He was found hypotensive and bradycardiac at admission. Diagnostic initially showed AKI, lactic acidosis, transaminitis. He was admitted to ICU for septic shock, started on IVF and vasopressor support and antibiotic. CTA torso showed peribronchovascular ground-glass airspace opacities and hepatic mass,  increase in size of a 7.3 x 5.7 cm gastroesophageal and gastric fundus mass.  Blood culture grew Klebsiella pneumoniae.  Source of sepsis was felt unclear.  He was seen by GI, felt transaminitis has been chronic for few months without significant change and with known liver mets, felt less likely for biliary  obstruction or cholangitis.  His malignancy was felt progressed based on imaging.  He was seen by oncology Dr. Myrle Sheng, felt infection source may be related to abdominal tumor burden.  His prognosis was felt poor and hospice care was recommended.  Patient had indicated he wants to continue treatment of his cancer despite small chance of improvement and potential toxicity of chemotherapy outweighs the benefit.  He was not able to make a decision on CODE STATUS as well as enrollment with hospice care.  Palliative care consult has been requested.  Echocardiogram repeated yesterday revealing LVEF 55 to 60%, no regional wall motion abnormality, indeterminate diastolic parameter, severely reduced RV, severely enlarged RV, severe RAE, trivial MR, moderate tricuspid regurgitation, trivial AI.  IV fluid was discontinued today.  Due to bradycardia, home medication amiodarone had been suspended.  Blood work today revealed improving AKI with creatinine 1.62 and EGFR 48, BUN 26, bicarb 19.  BNP was 405 from 02/28/2023.  Lactic acid elevated 2.6 >3.4 >4.1 >4 >2.3.  EKG from 02/28/23 with significant artifact and difficult to interpret. Cardiology consult has been requested today for further evaluation of right-sided heart failure.  Upon encounter today, he states he feels poor, but slightly improved. He can't elaborate history well. He denied chest pain, worsening chronic SOB, leg edema. He states he can't agree on hospice because he still has so much energy.    Per chart review, from cardiology standpoint, patient suffered V-fib arrest August 2023 in the setting of STEMI.  Urgent cardiac catheterization 02/06/2022 showed single-vessel occlusive CAD with proximal nondominant RCA, 30% distal  circumflex, LVEDP normal.  He was recommended medical therapy with statin and Plavix.  Echo at the time revealed LVEF 60 to 65%, RV pressure overload, RV severely dilated with McConnell's sign, moderately reduced RV systolic function,  severely enlarged RV.    He was recently hospitalized here 07/2022 for sustained ventricular tachycardia with hemodynamic instability.  Echocardiogram on 08/05/2022 showed LVEF 45 to 50%, difficult assess regional wall motion, mild LVH, grade 1 DD, RV pressure and volume overload, moderate RV systolic function reduction, moderate RAE.  He did receive defibrillation by EMS.  Etiology was felt due to QT prolongation/hypokalemia/hypomagnesia.  He was started on amiodarone. He was referred to EP Dr. Ladona Ridgel who felt the patient is not a candidate for ICD due to underlying metastatic esophageal cancer with ongoing chemotherapy treatment.  He failed to follow-up with general cardiology outpatient.  He saw EP 09/28/22, advised to continue amiodarone, BP was noted to be soft 108/60.      Past Medical History:  Diagnosis Date   Acute respiratory failure (HCC) 01/31/2022   Bilateral shoulder pain 08/20/2012   Bite wound of left hand 09/27/2021   CAD (coronary artery disease)    Occluded nondominent RCA managed medically - August 2023   Cardiac arrest [facility])    OOH VF - August 2023   Community acquired pneumonia 11/07/2011   Critical limb ischemia with history of revascularization of same extremity (HCC) 04/28/2020   DDD (degenerative disc disease), lumbosacral     and grade 2 slip   DVT (deep venous thrombosis) (HCC) 01/31/2022   Esophageal cancer (HCC)    Adenocarcinoma with metastasis   GERD (gastroesophageal reflux disease)    History of anemia    History of COVID-19 05/10/2019   History of critical lower limb ischemia 02/19/2019   Hypertension    Impingement syndrome of shoulder region    Ischemic ulcer of toe of left foot (HCC)    Liver mass    Low back pain without sciatica    Lupus (HCC)    Multiple fractures of ribs, bilateral, due to CPR trauma    Onychomycosis    Paronychia of great toe    Peripheral vascular disease (HCC)    Pressure injury of skin    Pulmonary embolus (HCC)    RA  (rheumatoid arthritis) (HCC)    Rotator cuff tear 11/04/2014   Sternal fracture    Stroke [facility])     Past Surgical History:  Procedure Laterality Date   ABDOMINAL AORTAGRAM  02/20/2019   ABDOMINAL AORTOGRAM W/LOWER EXTREMITY N/A 02/20/2019   Procedure: ABDOMINAL AORTOGRAM W/LOWER EXTREMITY;  Surgeon: Cephus Shelling, MD;  [facility]: [facility];  Service: Cardiovascular;  Laterality: N/A;   ACHILLES TENDON SURGERY Bilateral    AMPUTATION Left 05/11/2020   Procedure: LEFT ABOVE KNEE AMPUTATION;  Surgeon: Chuck Hint, MD;  Location: [facility] OR;  Service: Vascular;  Laterality: Left;   ENDARTERECTOMY POPLITEAL Left 05/01/2020   Procedure: ENDARTERECTOMY POPLITEAL;  Surgeon: Maeola Harman, MD;  [facility] OR;  Service: Vascular;  Laterality: Left;   FASCIOTOMY Left 05/01/2020   Procedure: FASCIOTOMY;  Surgeon: Maeola Harman, MD;  Location: [facility] OR;  Service: Vascular;  Laterality: Left;   FEMORAL-POPLITEAL BYPASS GRAFT Left 02/26/2019   Procedure: BYPASS GRAFT FEMORAL-POPLITEAL ARTERY LEFT LEG USING 6mm PROPATEN GRAFT;  Surgeon: Nada Libman, MD;  Location: [facility] OR;  Service: Vascular;  Laterality: Left;   INTRAOPERATIVE ARTERIOGRAM Left 01/22/2020   Procedure: INTRA OPERATIVE ARTERIOGRAM with administration of  thrombolyics in Left femoral to popliteal bypass.;  Surgeon: Cephus Shelling, MD;  Location: [facility] OR;  Service: Vascular;  Laterality: Left;   IR IMAGING GUIDED PORT INSERTION  03/07/2022   LEFT HEART CATH AND CORONARY ANGIOGRAPHY N/A 03/15/2017   Procedure: LEFT HEART CATH AND CORONARY ANGIOGRAPHY;  Surgeon: Yvonne Kendall, MD;  Location: [facility];  Service: Cardiovascular;  Laterality: N/A;   LEFT HEART CATH AND CORONARY ANGIOGRAPHY N/A 02/06/2022   Procedure: LEFT HEART CATH AND CORONARY ANGIOGRAPHY;  Surgeon: Swaziland, Peter M, MD;  Location: [facility] INVASIVE CV LAB;  Service: Cardiovascular;  Laterality: N/A;   LOWER EXTREMITY INTERVENTION  Left 04/29/2020   Procedure: LOWER EXTREMITY INTERVENTION- LYSIS;  Surgeon: Cephus Shelling, MD;  Location: [facility];  Service: Cardiovascular;  Laterality: Left;   OSTEOTOMY AND ULNAR SHORTENING Right 07/22/2002   PATCH ANGIOPLASTY Left 05/01/2020   Procedure: PATCH ANGIOPLASTY USING HEMASHIELD PLATINUM FINESSE PATCH;  Surgeon: Maeola Harman, MD;  Location: [facility] OR;  Service: Vascular;  Laterality: Left;   PERIPHERAL VASCULAR ATHERECTOMY  01/23/2020   Procedure: PERIPHERAL VASCULAR ATHERECTOMY;  Surgeon: Cephus Shelling, MD;  Location: [facility];  Service: Cardiovascular;;   PERIPHERAL VASCULAR BALLOON ANGIOPLASTY  01/23/2020   Procedure: PERIPHERAL VASCULAR BALLOON ANGIOPLASTY;  Surgeon: Cephus Shelling, MD;  Location: [facility];  Service: Cardiovascular;;   PERIPHERAL VASCULAR INTERVENTION Left 04/30/2020   Procedure: PERIPHERAL VASCULAR INTERVENTION;  Surgeon: Leonie Douglas, MD;  [facility]: [facility];  Service: Cardiovascular;  Laterality: Left;   PERIPHERAL VASCULAR THROMBECTOMY N/A 01/23/2020   Procedure: lysis recheck;  Surgeon: Cephus Shelling, MD;  Location: [facility];  Service: Cardiovascular;  Laterality: N/A;  + Penumbra    PERIPHERAL VASCULAR THROMBECTOMY N/A 04/30/2020   Procedure: Lysis Recheck;  Surgeon: Leonie Douglas, MD;  Location: [facility];  Service: Cardiovascular;  Laterality: N/A;   SHOULDER ARTHROSCOPY WITH BICEPSTENOTOMY Right 03/11/2015   Procedure: SHOULDER ARTHROSCOPY WITH BICEPSTENOTOMY;  Surgeon: Loreta Ave, MD;  Location: [facility];  Service: Orthopedics;  Laterality: Right;   SHOULDER ARTHROSCOPY WITH DISTAL CLAVICLE RESECTION Left 11/19/2014   Procedure: SHOULDER ARTHROSCOPY WITH DISTAL CLAVICLE RESECTION;  Surgeon: Mckinley Jewel, MD;  Location: [facility];  Service: Orthopedics;  Laterality: Left;   SHOULDER ARTHROSCOPY WITH ROTATOR CUFF REPAIR AND  SUBACROMIAL DECOMPRESSION Left 11/19/2014   Procedure: LEFT SHOULDER ARTHROSCOPY, DEBRIDEMENT DISTAL CLAVICLE EXCISION, ACROMIOPLASTY WITH ROTATOR CUFF REPAIR ;  Surgeon: Mckinley Jewel, MD;  Location: [facility];  Service: Orthopedics;  Laterality: Left;   THROMBECTOMY OF BYPASS GRAFT FEMORAL- POPLITEAL ARTERY Left 05/01/2020   Procedure: THROMBECTOMY OF LOWER EXTREMITY;  Surgeon: Maeola Harman, MD;  Location: [facility] OR;  Service: Vascular;  Laterality: Left;   TRANSFORAMINAL LUMBAR INTERBODY FUSION (TLIF) WITH PEDICLE SCREW FIXATION 1 LEVEL N/A 01/03/2018   Procedure: TRANSFORAMINAL LUMBAR INTERBODY FUSION (TLIF) LUMBAR FIVE-SACRAL ONE;  Surgeon: Venita Lick, MD;  Location: [facility] OR;  Service: Orthopedics;  Laterality: N/A;   ULTRASOUND GUIDANCE FOR VASCULAR ACCESS Right 01/22/2020   Procedure: ULTRASOUND GUIDANCE FOR VASCULAR ACCESS Right Common Femoral Artery.;  Surgeon: Cephus Shelling, MD;  Location: [facility] OR;  Service: Vascular;  Laterality: Right;   VIDEO ASSISTED THORACOSCOPY (VATS)/DECORTICATION Right 11/21/2011   drainage of empyema     Home Medications:  Prior to Admission medications   Medication Sig Start Date End Date Taking? Authorizing Provider  albuterol (VENTOLIN HFA) 108 (90  Base) MCG/ACT inhaler INHALE 2 PUFFS INTO THE LUNGS EVERY 6 (SIX) HOURS AS NEEDED FOR WHEEZING OR SHORTNESS OF BREATH. 01/29/23  Yes Ladene Artist, MD  amiodarone (PACERONE) 200 MG tablet Take 1 tablet (200 mg total) by mouth 2 (two) times daily. 09/27/22 09/27/23 Yes Adron Bene, MD  amLODipine (NORVASC) 10 MG tablet Take 1 tablet (10 mg total) by mouth daily. 08/09/22 02/28/23 Yes Carrion-Carrero, Margely, MD  apixaban (ELIQUIS) 5 MG TABS tablet TAKE 1 TABLET (5 MG TOTAL) BY MOUTH 2 (TWO) TIMES DAILY. 02/20/23  Yes Rocky Morel, DO  atorvastatin (LIPITOR) 40 MG tablet Take 1 tablet (40 mg total) by mouth daily at 6 PM. 09/14/22 02/28/23 Yes Rocky Morel, DO  clopidogrel (PLAVIX) 75 MG  tablet Take 75 mg by mouth daily. 09/14/22  Yes [provider]  diclofenac Sodium (VOLTAREN) 1 % GEL Apply 2 g topically 4 (four) times daily. 08/09/22  Yes Carrion-Carrero, Karle Starch, MD  DULoxetine (CYMBALTA) 30 MG capsule Take 30 mg by mouth daily as needed (sleep). 06/21/22  Yes [provider]  feeding supplement (ENSURE ENLIVE / ENSURE PLUS) LIQD Take 237 mLs by mouth 3 (three) times daily between meals. 08/09/22  Yes Carrion-Carrero, Margely, MD  fluticasone-salmeterol (ADVAIR HFA) [phone] MCG/ACT inhaler Inhale 2 puffs into the lungs 2 (two) times daily. 10/03/22  Yes Adron Bene, MD  folic acid (FOLVITE) 1 MG tablet Take 1 mg by mouth daily.   Yes [provider]  gabapentin (NEURONTIN) 300 MG capsule Take 1 capsule (300 mg total) by mouth 2 (two) times daily. Daily or twice daily if tolerated. 11/09/22  Yes Rocky Morel, DO  hydrOXYzine (ATARAX) 25 MG tablet Take 25 mg by mouth at bedtime. 02/12/23  Yes [provider]  isosorbide mononitrate (IMDUR) 30 MG 24 hr tablet Take 0.5 tablets (15 mg total) by mouth daily. 02/05/23 03/07/23 Yes Trifan, Kermit Balo, MD  lidocaine-prilocaine (EMLA) cream Apply 1 Application topically as directed. Apply to port site 2 hours prior to stick and cover with Press-and-Seal to numb port before access 05/29/22  Yes Ladene Artist, MD  loperamide (IMODIUM A-D) 2 MG tablet Take 2 mg by mouth 4 (four) times daily as needed for diarrhea or loose stools.   Yes [provider]  meloxicam (MOBIC) 7.5 MG tablet Take 1 tablet (7.5 mg total) by mouth daily as needed for pain. 11/30/22 03/30/23 Yes Rocky Morel, DO  [facility] powder Apply 1 Application topically 2 (two) times daily. 02/23/23  Yes [provider]  ondansetron (ZOFRAN) 8 MG tablet START TAKING 72 HOURS AFTER CHEMOTHERAPY TREATMENT. Patient taking differently: Take 8 mg by mouth every 8 (eight) hours as needed for vomiting or nausea. 02/14/23  Yes Ladene Artist,  MD  Oxycodone HCl 10 MG TABS Take 10 mg by mouth every 6 (six) hours. 09/14/22  Yes [provider]  potassium chloride (KLOR-CON) 10 MEQ tablet TAKE 2 TABLETS(20 MEQ) BY MOUTH DAILY 02/07/23  Yes Ladene Artist, MD  prochlorperazine (COMPAZINE) 10 MG tablet TAKE 1 TABLET (10 MG TOTAL) BY MOUTH EVERY 6 (SIX) HOURS AS NEEDED. 01/29/23  Yes Ladene Artist, MD  ferrous sulfate 325 (65 FE) MG tablet Take 1 tablet (325 mg total) by mouth daily with breakfast for 100 doses. 11/30/22 03/10/23  Rocky Morel, DO  methotrexate (RHEUMATREX) 2.5 MG tablet Take 3 tablets (7.5 mg total) by mouth once a week. Caution:Chemotherapy. Protect from light. Patient not taking: Reported on 02/28/2023 10/17/22   Rocky Morel, DO  Inpatient Medications: Scheduled Meds:  Chlorhexidine Gluconate Cloth  6 each Topical Daily   clopidogrel  75 mg Oral Daily   feeding supplement  237 mL Oral TID BM   ferrous sulfate  325 mg Oral Q breakfast   folic acid  1 mg Oral Daily   Gerhardt's butt cream   Topical TID   heparin  5,000 Units Subcutaneous Q8H   insulin aspart  0-9 Units Subcutaneous TID WC   lidocaine-prilocaine  1 Application Topical UD   mometasone-formoterol  2 puff Inhalation BID   oxyCODONE  10 mg Oral Q6H   polyethylene glycol  17 g Oral Daily   senna-docusate  1 tablet Oral BID   sodium chloride flush  10-40 mL Intracatheter Q12H   Continuous Infusions:  sodium chloride 250 mL (02/28/23 1725)   cefTRIAXone (ROCEPHIN)  IV Stopped (03/01/23 1748)   norepinephrine (LEVOPHED) Adult infusion 3 mcg/min (03/02/23 1500)   vasopressin Stopped (03/02/23 1131)   PRN Meds: albuterol, bisacodyl, docusate sodium, DULoxetine, fentaNYL (SUBLIMAZE) injection, ondansetron (ZOFRAN) IV, mouth rinse, polyethylene glycol, prochlorperazine, sodium chloride flush  Allergies:    Allergies  Allergen Reactions   Dilaudid [Hydromorphone Hcl] Rash   Ramipril Rash and Other (See Comments)    Per patient, rash was  on the right arm and leg only; no angioedema   Tramadol Itching and Rash    Social History:   Social History   Socioeconomic History   Marital status: Single    Spouse name: Not on file   Number of children: 0   Years of education: Not on file   Highest education level: Not on file  Occupational History   Occupation: Disabled    Comment: 2/2 RA  Tobacco Use   Smoking status: Former    Current packs/day: 0.00    Average packs/day: 0.3 packs/day for 20.0 years (5.0 ttl pk-yrs)    Types: Cigarettes    Start date: 02/19/1999    Quit date: 02/19/2019    Years since quitting: 4.0   Smokeless tobacco: Never   Tobacco comments:    Patient stated he smokes a couple day and is attempting to quit smoking  Vaping Use   Vaping status: Never Used  Substance and Sexual Activity   Alcohol use: No    Alcohol/week: 0.0 standard drinks of alcohol    Comment: sober 1998   Drug use: No   Sexual activity: Not on file  Other Topics Concern   Not on file  Social History Narrative   On disability since 1992, used to work with city of [place]. Quit drinking in 1990.      Current Social History 11/05/2019        Patient lives by himself most of the time. Sometimes girlfriend's son stays with him in a one level home. There are 3 steps with handrail up to the entrance the patient uses.       Patient's method of transportation is personal truck. This was recently vandalized and patient doesn't have funds to repair at this time.      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      Identified important Relationships are "My girlfriend, Selena Batten."       Pets : American Terrier Archivist), Lab (Roxy)       Interests / Fun: Walk, watch TV       Current Stressors: "Going through a lot the last 6-7 years; I  worry too much about my health." (Discussed IBH, patient not interested at this time.)      Religious / Personal Beliefs: Baptist       L. Ducatte,  BSN, RN-BC    Social Determinants of Health   Financial Resource Strain: Low Risk  (11/10/2022)   Overall Financial Resource Strain (CARDIA)    Difficulty of Paying Living Expenses: Not hard at all  Food Insecurity: No Food Insecurity (11/10/2022)   Hunger Vital Sign    Worried About Running Out of Food in the Last Year: Never true    Ran Out of Food in the Last Year: Never true  Transportation Needs: Unmet Transportation Needs (11/10/2022)   PRAPARE - Administrator, Civil Service (Medical): Yes    Lack of Transportation (Non-Medical): Yes  Physical Activity: Insufficiently Active (11/10/2022)   Exercise Vital Sign    Days of Exercise per Week: 2 days    Minutes of Exercise per Session: 10 min  Stress: No Stress Concern Present (11/10/2022)   [employer] of Occupational Health - Occupational Stress Questionnaire    Feeling of Stress : Not at all  Social Connections: Moderately Isolated (11/10/2022)   Social Connection and Isolation Panel [NHANES]    Frequency of Communication with Friends and Family: Twice a week    Frequency of Social Gatherings with Friends and Family: More than three times a week    Attends Religious Services: Never    Database administrator or Organizations: No    Attends Banker Meetings: Never    Marital Status: Living with partner  Intimate Partner Violence: Not At Risk (11/10/2022)   Humiliation, Afraid, Rape, and Kick questionnaire    Fear of Current or Ex-Partner: No    Emotionally Abused: No    Physically Abused: No    Sexually Abused: No    Family History:    Family History  Problem Relation Age of Onset   Heart attack Father 48   Alcohol abuse Father    Aneurysm Mother    Stroke Neg Hx    Cancer Neg Hx      ROS:  Constitutional: feel poor  Eyes: Denied vision change or loss Ears/Nose/Mouth/Throat: Denied ear ache, sore throat, coughing, sinus pain Cardiovascular: denied chest pain/pressure Respiratory: denied  shortness of breath Gastrointestinal: Denied nausea, vomiting, abdominal pain, diarrhea Genital/Urinary: Denied dysuria, hematuria, urinary frequency/urgency Musculoskeletal: Denied muscle ache, joint pain Skin: Denied rash, wound Neuro: Denied headache, dizziness, syncope Psych: Denied history of depression/anxiety  Endocrine: Denied history of diabetes   Physical Exam/Data:   Vitals:   03/02/23 1415 03/02/23 1430 03/02/23 1445 03/02/23 1500  BP: 99/64 97/60 (!) 86/62 98/62  Pulse: 62 63 64 64  Resp: 11 12 (!) 9 14  Temp:      TempSrc:      SpO2: 97% 96% 95% 93%  Weight:        Intake/Output Summary (Last 24 hours) at 03/02/2023 1535 Last data filed at 03/02/2023 1500 Gross per 24 hour  Intake 978.98 ml  Output 400 ml  Net 578.98 ml      03/01/2023    5:00 AM 02/28/2023    8:45 PM 02/05/2023    2:44 PM  Last 3 Weights  Weight (lbs) 124 lb 12.5 oz 135 lb 135 lb  Weight (kg) 56.6 kg 61.236 kg 61.236 kg     Body mass index is 18.97 kg/m.   Vitals:  Vitals:   03/02/23 1445 03/02/23 1500  BP: (!) 86/62 98/62  Pulse: 64 64  Resp: (!) 9 14  Temp:    SpO2: 95% 93%   General Appearance: In no apparent distress, laying in bed, chronic ill appearing  HEENT: Normocephalic, atraumatic.  Neck: Supple, trachea midline, no JVDs Cardiovascular: Regular rate and rhythm, normal S1-S2,  no murmur Respiratory: Resting breathing unlabored, lungs sounds clear to auscultation bilaterally, no use of accessory muscles. On room air.  No wheezes, rales or rhonchi.   Gastrointestinal: Bowel sounds positive, abdomen soft.  Extremities: RLE in boots, no edema; s/p right AKA Musculoskeletal: generalized muscular wasting  Skin: Intact, warm, dry. No rashes  Neurologic: Alert, oriented to person, place and time. Limited insight  Psychiatric: Normal affect. Mood is appropriate.      EKG:  The EKG was personally reviewed and demonstrates:    EKG from 9//24 not interpretable due to significant  artifact, repeat EKG ordered today  Telemetry:  Telemetry was personally reviewed and demonstrates:    Sinus rhythm 60s   Relevant CV Studies:   Echocardiogram from 03/01/2023:  1. Left ventricular ejection fraction, by estimation, is 55 to 60%. The  left ventricle has normal function. The left ventricle has no regional  wall motion abnormalities. Left ventricular diastolic parameters are  indeterminate.   2. Right ventricular systolic function is severely reduced. The right  ventricular size is severely enlarged.   3. Right atrial size was severely dilated.   4. The mitral valve is normal in structure. Trivial mitral valve  regurgitation. No evidence of mitral stenosis.   5. Tricuspid valve regurgitation is moderate.   6. The aortic valve is normal in structure. Aortic valve regurgitation is  trivial. No aortic stenosis is present.   7. The inferior vena cava is dilated in size with <50% respiratory  variability, suggesting right atrial pressure of 15 mmHg.     Echo from 12/27/22:  1. Left ventricular ejection fraction, by estimation, is 50 to 55%. The  left ventricle has low normal function. The left ventricle has no regional  wall motion abnormalities. There is mild left ventricular hypertrophy.  Left ventricular diastolic  parameters are indeterminate.   2. Right ventricular systolic function is moderately reduced. The right  ventricular size is moderately enlarged. There is mildly elevated  pulmonary artery systolic pressure. The estimated right ventricular  systolic pressure is 40.4 mmHg.   3. Right atrial size was moderately dilated.   4. The mitral valve is normal in structure. No evidence of mitral valve  regurgitation.   5. The aortic valve was not well visualized. Aortic valve regurgitation  is trivial. Aortic valve sclerosis/calcification is present, without any  evidence of aortic stenosis.   6. The inferior vena cava is dilated in size with <50% respiratory   variability, suggesting right atrial pressure of 15 mmHg.    Echo from 08/05/22:    1. Left ventricular ejection fraction, by estimation, is 45 to 50%. The  left ventricle has mildly decreased function. Left ventricular endocardial  border not optimally defined to evaluate regional wall motion. There is  mild left ventricular hypertrophy.   Left ventricular diastolic parameters are consistent with Grade I  diastolic dysfunction (impaired relaxation). There is the interventricular  septum is flattened in systole and diastole, consistent with right  ventricular pressure and volume overload.   2. Right ventricular systolic function is moderately reduced. The right  ventricular size is moderately enlarged. There is normal pulmonary artery  systolic pressure. The estimated right ventricular systolic  pressure is  31.1 mmHg.   3. Right atrial size was moderately dilated.   4. The mitral valve is normal in structure. No evidence of mitral valve  regurgitation. No evidence of mitral stenosis.   5. The aortic valve is tricuspid. There is moderate calcification of the  aortic valve. Aortic valve regurgitation is not visualized. No aortic  stenosis is present.   6. The inferior vena cava is normal in size with greater than 50%  respiratory variability, suggesting right atrial pressure of 3 mmHg.   Comparison(s): No significant change from prior study.   Left heart cath from 02/06/2022:    Dist Cx lesion is 30% stenosed.   Prox RCA lesion is 100% stenosed.   LV end diastolic pressure is normal.   Single vessel occlusive CAD. Occlusion of proximal nondominant RCA which is new since 2018.  Normal LVEDP   Plan: medical therapy    Laboratory Data:  High Sensitivity Troponin:   Recent Labs  Lab 02/05/23 1521 02/05/23 1718  TROPONINIHS 10 10     Chemistry Recent Labs  Lab 02/28/23 1641 02/28/23 1726 02/28/23 1739 03/01/23 0606  NA 137 141 141 136  K 4.0 4.5 4.9 4.4  CL 111   --  110 105  CO2 19*  --   --  19*  GLUCOSE 150*  --  134* 147*  BUN 29*  --  29* 26*  CREATININE 1.95*  --  1.90* 1.62*  CALCIUM 7.3*  --   --  8.4*  MG 1.3*  --   --  1.2*  GFRNONAA 38*  --   --  48*  ANIONGAP 7  --   --  12    Recent Labs  Lab 02/28/23 1641  PROT 6.2*  ALBUMIN 2.2*  AST 68*  ALT 41  ALKPHOS 265*  BILITOT 0.5   Lipids No results for input(s): "CHOL", "TRIG", "HDL", "LABVLDL", "LDLCALC", "CHOLHDL" in the last 168 hours.  Hematology Recent Labs  Lab 02/28/23 1641 02/28/23 1726 02/28/23 1739 03/01/23 0606  WBC 9.9  --   --  18.6*  RBC 3.79*  --   --  4.20*  HGB 10.5* 10.9* 11.6* 11.4*  HCT 34.6* 32.0* 34.0* 37.0*  MCV 91.3  --   --  88.1  MCH 27.7  --   --  27.1  MCHC 30.3  --   --  30.8  RDW 17.4*  --   --  17.5*  PLT 121*  --   --  223   Thyroid No results for input(s): "TSH", "FREET4" in the last 168 hours.  BNP Recent Labs  Lab 02/28/23 2203  BNP 405.6*    DDimer No results for input(s): "DDIMER" in the last 168 hours.   Radiology/Studies:  US Abdomen Limited RUQ (LIVER/GB)  Result Date: 03/01/2023 CLINICAL DATA:  Biliary obstruction. History of gastroesophageal cancer. EXAM: ULTRASOUND ABDOMEN LIMITED RIGHT UPPER QUADRANT COMPARISON:  CT 09/12/2022.  CT angiogram chest 02/28/2023 FINDINGS: Gallbladder: Surgically removed Common bile duct: Diameter: 2 mm. There are some mild areas of intrahepatic biliary duct ectasia as seen on previous examinations. There is significant biliary gas on the prior CT scan in the left hepatic lobe Liver: Heterogeneous echogenic liver with multiple mass lesions as seen previously consistent with known metastatic disease. Please correlate with the prior CT. Portal vein is patent on color Doppler imaging with normal direction of blood flow towards the liver. Other: Mild ascites. IMPRESSION: Previous cholecystectomy. No common ductal dilatation. There is some  areas of intrahepatic biliary duct ectasia as seen on prior CT  scans. Air in the biliary tree on the prior CT. Heterogeneous liver with multiple mass lesions consistent with known liver metastases better seen on the prior examinations. Trace ascites Electronically Signed   By: Karen Kays M.D.   On: 03/01/2023 13:35   ECHOCARDIOGRAM COMPLETE  Result Date: 03/01/2023    ECHOCARDIOGRAM REPORT   Patient Name:   SHAUGHN BJORKLUND Date of Exam: 03/01/2023 Medical Rec #:  [id number]     Height:       68.0 in Accession #:    [id number]    Weight:       124.8 lb Date of Birth:  03-Apr-1961    BSA:          1.672 m Patient Age:    61 years      BP:           88/72 mmHg Patient Gender: M             HR:           53 bpm. Exam Location:  Inpatient Procedure: 2D Echo, Cardiac Doppler, Color Doppler and Intracardiac            Opacification Agent Indications:    Shock R57.9  History:        Patient has prior history of Echocardiogram examinations, most                 recent 12/27/2022. CAD; Risk Factors:Hypertension.  Sonographer:    Harriette Bouillon RDCS Referring Phys: [id number] JESSICA MARSHALL IMPRESSIONS  1. Left ventricular ejection fraction, by estimation, is 55 to 60%. The left ventricle has normal function. The left ventricle has no regional wall motion abnormalities. Left ventricular diastolic parameters are indeterminate.  2. Right ventricular systolic function is severely reduced. The right ventricular size is severely enlarged.  3. Right atrial size was severely dilated.  4. The mitral valve is normal in structure. Trivial mitral valve regurgitation. No evidence of mitral stenosis.  5. Tricuspid valve regurgitation is moderate.  6. The aortic valve is normal in structure. Aortic valve regurgitation is trivial. No aortic stenosis is present.  7. The inferior vena cava is dilated in size with <50% respiratory variability, suggesting right atrial pressure of 15 mmHg. FINDINGS  Left Ventricle: Left ventricular ejection fraction, by estimation, is 55 to 60%. The left ventricle has normal  function. The left ventricle has no regional wall motion abnormalities. Definity contrast agent was given IV to delineate the left ventricular  endocardial borders. The left ventricular internal cavity size was normal in size. There is no left ventricular hypertrophy. Left ventricular diastolic parameters are indeterminate. Right Ventricle: The right ventricular size is severely enlarged. Right ventricular systolic function is severely reduced. Left Atrium: Left atrial size was normal in size. Right Atrium: Right atrial size was severely dilated. Pericardium: There is no evidence of pericardial effusion. Mitral Valve: The mitral valve is normal in structure. Mild mitral annular calcification. Trivial mitral valve regurgitation. No evidence of mitral valve stenosis. Tricuspid Valve: The tricuspid valve is normal in structure. Tricuspid valve regurgitation is moderate . No evidence of tricuspid stenosis. Aortic Valve: The aortic valve is normal in structure. Aortic valve regurgitation is trivial. No aortic stenosis is present. Pulmonic Valve: The pulmonic valve was normal in structure. Pulmonic valve regurgitation is not visualized. No evidence of pulmonic stenosis. Aorta: The aortic root is normal in size and structure. Venous: The inferior vena cava  is dilated in size with less than 50% respiratory variability, suggesting right atrial pressure of 15 mmHg. IAS/Shunts: No atrial level shunt detected by color flow Doppler.  LEFT VENTRICLE PLAX 2D LVIDd:         4.30 cm LVIDs:         3.60 cm LV PW:         1.10 cm LV IVS:        1.10 cm LVOT diam:     2.30 cm LV SV:         51 LV SV Index:   31 LVOT Area:     4.15 cm  RIGHT VENTRICLE            IVC RV S prime:     7.37 cm/s  IVC diam: 2.80 cm TAPSE (M-mode): 1.5 cm LEFT ATRIUM           Index        RIGHT ATRIUM           Index LA diam:      3.60 cm 2.15 cm/m   RA Area:     24.10 cm LA Vol (A4C): 26.2 ml 15.67 ml/m  RA Volume:   78.50 ml  46.94 ml/m  AORTIC VALVE  LVOT Vmax:   78.30 cm/s LVOT Vmean:  52.300 cm/s LVOT VTI:    0.123 m  AORTA Ao Root diam: 3.80 cm Ao Asc diam:  3.10 cm MITRAL VALVE MV Area (PHT): 3.86 cm    SHUNTS MV E velocity: 52.30 cm/s  Systemic VTI:  0.12 m                            Systemic Diam: 2.30 cm Olga Millers MD Electronically signed by Olga Millers MD Signature Date/Time: 03/01/2023/12:32:48 PM    Final    CT Angio Chest PE W and/or Wo Contrast  Result Date: 02/28/2023 CLINICAL DATA:  Pulmonary embolism (PE) suspected, high prob. Questionable sepsis. EXAM: CT ANGIOGRAPHY CHEST WITH CONTRAST TECHNIQUE: Multidetector CT imaging of the chest was performed using the standard protocol during bolus administration of intravenous contrast. Multiplanar CT image reconstructions and MIPs were obtained to evaluate the vascular anatomy. RADIATION DOSE REDUCTION: This exam was performed according to the departmental dose-optimization program which includes automated exposure control, adjustment of the mA and/or kV according to patient size and/or use of iterative reconstruction technique. CONTRAST:  75mL OMNIPAQUE IOHEXOL 350 MG/ML SOLN COMPARISON:  Chest x-ray 02/28/2019 FINDINGS: Cardiovascular: Satisfactory opacification of the pulmonary arteries to the segmental level. No evidence of pulmonary embolism. The main pulmonary artery is normal in caliber. Enlarged heart size. No significant pericardial effusion. The thoracic aorta is normal in caliber. At least moderate atherosclerotic plaque of the thoracic aorta. At least 3 vessel coronary artery calcifications. Right chest wall accessed Port-A-Cath with tip at superior cavoatrial junction. Mediastinum/Nodes: Esophageal lumen is filled with fluid. No enlarged mediastinal, hilar, or axillary lymph nodes. Thyroid gland and trachea demonstrate no significant findings. Lungs/Pleura: Limited evaluation due to motion artifact. Bilateral lower lobe atelectasis. Slightly less conspicuous right upper lobe  peripheral ground-glass airspace opacities. Persistent few scattered peribronchovascular ground-glass airspace opacities. No focal consolidation. Previously identified right lower lobe pulmonary nodule not identified on this study and possibly resolved or underlying atelectasis. No pulmonary nodule. No pulmonary mass. No pleural effusion. No pneumothorax. Upper Abdomen: Incompletely evaluated likely increased in size multiple poorly defined and conglomerative hepatic masses. Pneumobilia. Musculoskeletal: No chest wall  abnormality. No suspicious lytic or blastic osseous lesions. No acute displaced fracture. Old healed sternal fracture. Please see separately dictated thoracic spine CT 02/28/2023. Review of the MIP images confirms the above findings. IMPRESSION: 1. No pulmonary embolus. 2. Slightly less conspicuous right upper lobe peripheral ground-glass airspace opacities. Persistent few scattered peribronchovascular ground-glass airspace opacities. Findings may represent infection/inflammation or scarring with adenocarcinoma not excluded. Recommend continued follow-up. 3. Incompletely evaluated likely increased in size multiple poorly defined and conglomerative hepatic masses. 4. Interval increase in size of a 7.3 x 5.7 cm (from 4.6 x 3.7) gastroesophageal and gastric fundus mass. Associated fluid-filled esophageal lumen. No definite dilatation to suggest high-grade obstruction. 5. Aortic Atherosclerosis (ICD10-I70.0) and at least three-vessel coronary calcification. Electronically Signed   By: Tish Frederickson M.D.   On: 02/28/2023 19:30   CT T-SPINE NO CHARGE  Result Date: 02/28/2023 CLINICAL DATA:  fall on thinners back pain EXAM: CT THORACIC SPINE WITHOUT CONTRAST TECHNIQUE: Multidetector CT images of the thoracic were obtained using the standard protocol without intravenous contrast. RADIATION DOSE REDUCTION: This exam was performed according to the departmental dose-optimization program which includes  automated exposure control, adjustment of the mA and/or kV according to patient size and/or use of iterative reconstruction technique. COMPARISON:  CT angiography chest 02/11/2022 FINDINGS: Alignment: Exaggerated kyphotic curvature along the upper midthoracic spine likely positional. Otherwise normal alignment. Vertebrae: No acute fracture or focal pathologic process. Paraspinal and other soft tissues: Negative. Disc levels: Maintained. Other: Please see separately dictated CT angiography chest 02/28/2023. IMPRESSION: No acute displaced fracture or traumatic listhesis of the thoracic spine. Electronically Signed   By: Tish Frederickson M.D.   On: 02/28/2023 19:15   CT Head Wo Contrast  Result Date: 02/28/2023 CLINICAL DATA:  head trauma on AC; fall on thinners EXAM: CT HEAD WITHOUT CONTRAST CT CERVICAL SPINE WITHOUT CONTRAST TECHNIQUE: Multidetector CT imaging of the head and cervical spine was performed following the standard protocol without intravenous contrast. Multiplanar CT image reconstructions of the cervical spine were also generated. RADIATION DOSE REDUCTION: This exam was performed according to the departmental dose-optimization program which includes automated exposure control, adjustment of the mA and/or kV according to patient size and/or use of iterative reconstruction technique. COMPARISON:  CT head and C-spine 04/29/2022 FINDINGS: CT HEAD FINDINGS Brain: Chronic left occipital infarction. Patchy and confluent areas of decreased attenuation are noted throughout the deep and periventricular white matter of the cerebral hemispheres bilaterally, compatible with chronic microvascular ischemic disease. No evidence of large-territorial acute infarction. No parenchymal hemorrhage. No mass lesion. No extra-axial collection. No mass effect or midline shift. No hydrocephalus. Basilar cisterns are patent. Vascular: No hyperdense vessel. Atherosclerotic calcifications are present within the cavernous internal  carotid and vertebral arteries. Skull: No acute fracture or focal lesion. Sinuses/Orbits: Paranasal sinuses and mastoid air cells are clear. The orbits are unremarkable. Other: None. CT CERVICAL SPINE FINDINGS Alignment: Normal. Skull base and vertebrae: Multilevel mild-to-moderate degenerative changes of the spine. No associated severe osseous neural foraminal stenosis. No acute fracture. No aggressive appearing focal osseous lesion or focal pathologic process. Soft tissues and spinal canal: No prevertebral fluid or swelling. No visible canal hematoma. Upper chest: Unremarkable. Other: Atherosclerotic plaque of the carotid arteries within neck. Partially visualized right central venous catheter. IMPRESSION: 1. No acute intracranial abnormality. 2. No acute displaced fracture or traumatic listhesis of the cervical spine. Electronically Signed   By: Tish Frederickson M.D.   On: 02/28/2023 19:06   CT Cervical Spine Wo Contrast  Result Date:  02/28/2023 CLINICAL DATA:  head trauma on AC; fall on thinners EXAM: CT HEAD WITHOUT CONTRAST CT CERVICAL SPINE WITHOUT CONTRAST TECHNIQUE: Multidetector CT imaging of the head and cervical spine was performed following the standard protocol without intravenous contrast. Multiplanar CT image reconstructions of the cervical spine were also generated. RADIATION DOSE REDUCTION: This exam was performed according to the departmental dose-optimization program which includes automated exposure control, adjustment of the mA and/or kV according to patient size and/or use of iterative reconstruction technique. COMPARISON:  CT head and C-spine 04/29/2022 FINDINGS: CT HEAD FINDINGS Brain: Chronic left occipital infarction. Patchy and confluent areas of decreased attenuation are noted throughout the deep and periventricular white matter of the cerebral hemispheres bilaterally, compatible with chronic microvascular ischemic disease. No evidence of large-territorial acute infarction. No  parenchymal hemorrhage. No mass lesion. No extra-axial collection. No mass effect or midline shift. No hydrocephalus. Basilar cisterns are patent. Vascular: No hyperdense vessel. Atherosclerotic calcifications are present within the cavernous internal carotid and vertebral arteries. Skull: No acute fracture or focal lesion. Sinuses/Orbits: Paranasal sinuses and mastoid air cells are clear. The orbits are unremarkable. Other: None. CT CERVICAL SPINE FINDINGS Alignment: Normal. Skull base and vertebrae: Multilevel mild-to-moderate degenerative changes of the spine. No associated severe osseous neural foraminal stenosis. No acute fracture. No aggressive appearing focal osseous lesion or focal pathologic process. Soft tissues and spinal canal: No prevertebral fluid or swelling. No visible canal hematoma. Upper chest: Unremarkable. Other: Atherosclerotic plaque of the carotid arteries within neck. Partially visualized right central venous catheter. IMPRESSION: 1. No acute intracranial abnormality. 2. No acute displaced fracture or traumatic listhesis of the cervical spine. Electronically Signed   By: Tish Frederickson M.D.   On: 02/28/2023 19:06   DG Chest Port 1 View  Result Date: 02/28/2023 CLINICAL DATA:  Questionable sepsis-evaluate for abnormality EXAM: PORTABLE CHEST 1 VIEW COMPARISON:  02/05/2023 FINDINGS: Right IJ Port-A-Cath with tip in the right atrium. Stable cardiomediastinal silhouette. Aortic atherosclerotic calcification. Hyperinflation. Increased interstitial coarsening in the right lower lung compared with 02/05/2023. No focal consolidation, pleural effusion, or pneumothorax. No displaced rib fractures. IMPRESSION: Increased interstitial coarsening in the right lower lung compared to 02/05/2023. Findings may be due to volume loss, infection, or edema. Electronically Signed   By: Minerva Fester M.D.   On: 02/28/2023 18:23     Assessment and Plan:   Acute on chronic right heart failure   Cardiomyopathy  - RV systolic function reduced with RV enlargement noted through Echo historically and now, EF preserved/improved comparing to 07/2022 Echo, suspect underlying COPD contributing  - known esophageal cancer with liver metastasis undergoing active chemo with continued disease progression - Oncology felt prognosis is overall poor and has recommended hospice care - no significant benefit of escalating invasive diagnostic evaluation for right heart failure at this time, he remains on vasopressor support currently for hypotension, clinically euvolemic, lactic acidosis and AKI improved with IVF and antibiotic, this is consistent with sepsis picture not low output CHF, would not add additional diuresis or GDMT at this time, monitor UOP and daily weight, if he has clinical improvement and wean off vasopressor, we can potentially add medical therapy for CHF; agree with ongoing goals of care discussion, he seems to have poor understanding of his prognosis  CAD - LHC 01/2022 with small non-dominant RCA occlusion, medical therapy recommended - he has no chest pain - continue plavix, statin held due to transaminitis, BP low will not add additional therapy   Hx of VT/VF - due  to STEMI 2023 and QT prolongation in 2024  - amiodarone held due to transaminitis - keep K >4 and Mag >2    Risk Assessment/Risk Scores:  { New York Heart Association (NYHA) Functional Class NYHA Class II        For questions or updates, please contact [facility] Please consult [email or web address] for contact info under    Signed, Cyndi Bender, NP  03/02/2023 3:35 PM  I have examined the patient and reviewed assessment and plan and discussed with patient.  Agree with above as stated.    RV dysfunction noted on the echocardiogram.  Patient with history of DVT/PE.  Minimal lower extremity edema.  Currently on pressors.  Cannot add any cardiac therapy at this point.  Lance Muss

## 2023-03-02 NOTE — Progress Notes (Signed)
IP PROGRESS NOTE  Subjective:   [name] has no complaint.  A friend is at the bedside.  Objective: Vital signs in last 24 hours: Blood pressure 90/65, pulse 65, temperature 98.7 F (37.1 C), temperature source Oral, resp. rate 12, weight 124 lb 12.5 oz (56.6 kg), SpO2 94%.  Intake/Output from previous day: 09/05 0701 - 09/06 0700 In: 1688 [P.O.:360; I.V.:920.4; IV Piggyback:407.5] Out: 400 [Urine:400]  Physical Exam:  HEENT: No thrush Lungs: Distant breath sounds, no respiratory distress Cardiac: Regular rate and rhythm Abdomen: Nontender, no hepatosplenomegaly, no mass Extremities: No right leg edema Neurologic: Alert and oriented, follows commands  Portacath/PICC-without erythema  Lab Results: Recent Labs    02/28/23 1641 02/28/23 1726 02/28/23 1739 03/01/23 0606  WBC 9.9  --   --  18.6*  HGB 10.5*   < > 11.6* 11.4*  HCT 34.6*   < > 34.0* 37.0*  PLT 121*  --   --  223   < > = values in this interval not displayed.    BMET Recent Labs    02/28/23 1641 02/28/23 1726 02/28/23 1739 03/01/23 0606  NA 137   < > 141 136  K 4.0   < > 4.9 4.4  CL 111  --  110 105  CO2 19*  --   --  19*  GLUCOSE 150*  --  134* 147*  BUN 29*  --  29* 26*  CREATININE 1.95*  --  1.90* 1.62*  CALCIUM 7.3*  --   --  8.4*   < > = values in this interval not displayed.    Lab Results  Component Value Date   CEA1 287.0 (H) 02/11/2022   CEA 191.50 (H) 01/08/2023   CAN199 88 (H) 02/11/2022    Studies/Results: US Abdomen Limited RUQ (LIVER/GB)  Result Date: 03/01/2023 CLINICAL DATA:  Biliary obstruction. History of gastroesophageal cancer. EXAM: ULTRASOUND ABDOMEN LIMITED RIGHT UPPER QUADRANT COMPARISON:  CT 09/12/2022.  CT angiogram chest 02/28/2023 FINDINGS: Gallbladder: Surgically removed Common bile duct: Diameter: 2 mm. There are some mild areas of intrahepatic biliary duct ectasia as seen on previous examinations. There is significant biliary gas on the prior CT scan in the  left hepatic lobe Liver: Heterogeneous echogenic liver with multiple mass lesions as seen previously consistent with known metastatic disease. Please correlate with the prior CT. Portal vein is patent on color Doppler imaging with normal direction of blood flow towards the liver. Other: Mild ascites. IMPRESSION: Previous cholecystectomy. No common ductal dilatation. There is some areas of intrahepatic biliary duct ectasia as seen on prior CT scans. Air in the biliary tree on the prior CT. Heterogeneous liver with multiple mass lesions consistent with known liver metastases better seen on the prior examinations. Trace ascites Electronically Signed   By: Karen Kays M.D.   On: 03/01/2023 13:35   ECHOCARDIOGRAM COMPLETE  Result Date: 03/01/2023    ECHOCARDIOGRAM REPORT   Patient Name:   [name] [name] Date of Exam: 03/01/2023 Medical Rec #:  [id number]     Height:       68.0 in Accession #:    [id number]    Weight:       124.8 lb Date of Birth:  [birth year]    BSA:          1.672 m Patient Age:    62 years      BP:           88/72 mmHg Patient Gender: M  HR:           53 bpm. Exam Location:  Inpatient Procedure: 2D Echo, Cardiac Doppler, Color Doppler and Intracardiac            Opacification Agent Indications:    Shock R57.9  History:        Patient has prior history of Echocardiogram examinations, most                 recent 12/27/2022. CAD; Risk Factors:Hypertension.  Sonographer:    Harriette Bouillon RDCS Referring Phys: [id number] JESSICA MARSHALL IMPRESSIONS  1. Left ventricular ejection fraction, by estimation, is 55 to 60%. The left ventricle has normal function. The left ventricle has no regional wall motion abnormalities. Left ventricular diastolic parameters are indeterminate.  2. Right ventricular systolic function is severely reduced. The right ventricular size is severely enlarged.  3. Right atrial size was severely dilated.  4. The mitral valve is normal in structure. Trivial mitral valve  regurgitation. No evidence of mitral stenosis.  5. Tricuspid valve regurgitation is moderate.  6. The aortic valve is normal in structure. Aortic valve regurgitation is trivial. No aortic stenosis is present.  7. The inferior vena cava is dilated in size with <50% respiratory variability, suggesting right atrial pressure of 15 mmHg. FINDINGS  Left Ventricle: Left ventricular ejection fraction, by estimation, is 55 to 60%. The left ventricle has normal function. The left ventricle has no regional wall motion abnormalities. Definity contrast agent was given IV to delineate the left ventricular  endocardial borders. The left ventricular internal cavity size was normal in size. There is no left ventricular hypertrophy. Left ventricular diastolic parameters are indeterminate. Right Ventricle: The right ventricular size is severely enlarged. Right ventricular systolic function is severely reduced. Left Atrium: Left atrial size was normal in size. Right Atrium: Right atrial size was severely dilated. Pericardium: There is no evidence of pericardial effusion. Mitral Valve: The mitral valve is normal in structure. Mild mitral annular calcification. Trivial mitral valve regurgitation. No evidence of mitral valve stenosis. Tricuspid Valve: The tricuspid valve is normal in structure. Tricuspid valve regurgitation is moderate . No evidence of tricuspid stenosis. Aortic Valve: The aortic valve is normal in structure. Aortic valve regurgitation is trivial. No aortic stenosis is present. Pulmonic Valve: The pulmonic valve was normal in structure. Pulmonic valve regurgitation is not visualized. No evidence of pulmonic stenosis. Aorta: The aortic root is normal in size and structure. Venous: The inferior vena cava is dilated in size with less than 50% respiratory variability, suggesting right atrial pressure of 15 mmHg. IAS/Shunts: No atrial level shunt detected by color flow Doppler.  LEFT VENTRICLE PLAX 2D LVIDd:         4.30 cm  LVIDs:         3.60 cm LV PW:         1.10 cm LV IVS:        1.10 cm LVOT diam:     2.30 cm LV SV:         51 LV SV Index:   31 LVOT Area:     4.15 cm  RIGHT VENTRICLE            IVC RV S prime:     7.37 cm/s  IVC diam: 2.80 cm TAPSE (M-mode): 1.5 cm LEFT ATRIUM           Index        RIGHT ATRIUM           Index  LA diam:      3.60 cm 2.15 cm/m   RA Area:     24.10 cm LA Vol (A4C): 26.2 ml 15.67 ml/m  RA Volume:   78.50 ml  46.94 ml/m  AORTIC VALVE LVOT Vmax:   78.30 cm/s LVOT Vmean:  52.300 cm/s LVOT VTI:    0.123 m  AORTA Ao Root diam: 3.80 cm Ao Asc diam:  3.10 cm MITRAL VALVE MV Area (PHT): 3.86 cm    SHUNTS MV E velocity: 52.30 cm/s  Systemic VTI:  0.12 m                            Systemic Diam: 2.30 cm Olga Millers MD Electronically signed by Olga Millers MD Signature Date/Time: 03/01/2023/12:32:48 PM    Final    CT Angio Chest PE W and/or Wo Contrast  Result Date: 02/28/2023 CLINICAL DATA:  Pulmonary embolism (PE) suspected, high prob. Questionable sepsis. EXAM: CT ANGIOGRAPHY CHEST WITH CONTRAST TECHNIQUE: Multidetector CT imaging of the chest was performed using the standard protocol during bolus administration of intravenous contrast. Multiplanar CT image reconstructions and MIPs were obtained to evaluate the vascular anatomy. RADIATION DOSE REDUCTION: This exam was performed according to the departmental dose-optimization program which includes automated exposure control, adjustment of the mA and/or kV according to patient size and/or use of iterative reconstruction technique. CONTRAST:  75mL OMNIPAQUE IOHEXOL 350 MG/ML SOLN COMPARISON:  Chest x-ray 02/28/2019 FINDINGS: Cardiovascular: Satisfactory opacification of the pulmonary arteries to the segmental level. No evidence of pulmonary embolism. The main pulmonary artery is normal in caliber. Enlarged heart size. No significant pericardial effusion. The thoracic aorta is normal in caliber. At least moderate atherosclerotic plaque of the  thoracic aorta. At least 3 vessel coronary artery calcifications. Right chest wall accessed Port-A-Cath with tip at superior cavoatrial junction. Mediastinum/Nodes: Esophageal lumen is filled with fluid. No enlarged mediastinal, hilar, or axillary lymph nodes. Thyroid gland and trachea demonstrate no significant findings. Lungs/Pleura: Limited evaluation due to motion artifact. Bilateral lower lobe atelectasis. Slightly less conspicuous right upper lobe peripheral ground-glass airspace opacities. Persistent few scattered peribronchovascular ground-glass airspace opacities. No focal consolidation. Previously identified right lower lobe pulmonary nodule not identified on this study and possibly resolved or underlying atelectasis. No pulmonary nodule. No pulmonary mass. No pleural effusion. No pneumothorax. Upper Abdomen: Incompletely evaluated likely increased in size multiple poorly defined and conglomerative hepatic masses. Pneumobilia. Musculoskeletal: No chest wall abnormality. No suspicious lytic or blastic osseous lesions. No acute displaced fracture. Old healed sternal fracture. Please see separately dictated thoracic spine CT 02/28/2023. Review of the MIP images confirms the above findings. IMPRESSION: 1. No pulmonary embolus. 2. Slightly less conspicuous right upper lobe peripheral ground-glass airspace opacities. Persistent few scattered peribronchovascular ground-glass airspace opacities. Findings may represent infection/inflammation or scarring with adenocarcinoma not excluded. Recommend continued follow-up. 3. Incompletely evaluated likely increased in size multiple poorly defined and conglomerative hepatic masses. 4. Interval increase in size of a 7.3 x 5.7 cm (from 4.6 x 3.7) gastroesophageal and gastric fundus mass. Associated fluid-filled esophageal lumen. No definite dilatation to suggest high-grade obstruction. 5. Aortic Atherosclerosis (ICD10-I70.0) and at least three-vessel coronary calcification.  Electronically Signed   By: Tish Frederickson M.D.   On: 02/28/2023 19:30   CT T-SPINE NO CHARGE  Result Date: 02/28/2023 CLINICAL DATA:  fall on thinners back pain EXAM: CT THORACIC SPINE WITHOUT CONTRAST TECHNIQUE: Multidetector CT images of the thoracic were obtained using the standard protocol without  intravenous contrast. RADIATION DOSE REDUCTION: This exam was performed according to the departmental dose-optimization program which includes automated exposure control, adjustment of the mA and/or kV according to patient size and/or use of iterative reconstruction technique. COMPARISON:  CT angiography chest 02/11/2022 FINDINGS: Alignment: Exaggerated kyphotic curvature along the upper midthoracic spine likely positional. Otherwise normal alignment. Vertebrae: No acute fracture or focal pathologic process. Paraspinal and other soft tissues: Negative. Disc levels: Maintained. Other: Please see separately dictated CT angiography chest 02/28/2023. IMPRESSION: No acute displaced fracture or traumatic listhesis of the thoracic spine. Electronically Signed   By: Tish Frederickson M.D.   On: 02/28/2023 19:15   CT Head Wo Contrast  Result Date: 02/28/2023 CLINICAL DATA:  head trauma on AC; fall on thinners EXAM: CT HEAD WITHOUT CONTRAST CT CERVICAL SPINE WITHOUT CONTRAST TECHNIQUE: Multidetector CT imaging of the head and cervical spine was performed following the standard protocol without intravenous contrast. Multiplanar CT image reconstructions of the cervical spine were also generated. RADIATION DOSE REDUCTION: This exam was performed according to the departmental dose-optimization program which includes automated exposure control, adjustment of the mA and/or kV according to patient size and/or use of iterative reconstruction technique. COMPARISON:  CT head and C-spine 04/29/2022 FINDINGS: CT HEAD FINDINGS Brain: Chronic left occipital infarction. Patchy and confluent areas of decreased attenuation are noted  throughout the deep and periventricular white matter of the cerebral hemispheres bilaterally, compatible with chronic microvascular ischemic disease. No evidence of large-territorial acute infarction. No parenchymal hemorrhage. No mass lesion. No extra-axial collection. No mass effect or midline shift. No hydrocephalus. Basilar cisterns are patent. Vascular: No hyperdense vessel. Atherosclerotic calcifications are present within the cavernous internal carotid and vertebral arteries. Skull: No acute fracture or focal lesion. Sinuses/Orbits: Paranasal sinuses and mastoid air cells are clear. The orbits are unremarkable. Other: None. CT CERVICAL SPINE FINDINGS Alignment: Normal. Skull base and vertebrae: Multilevel mild-to-moderate degenerative changes of the spine. No associated severe osseous neural foraminal stenosis. No acute fracture. No aggressive appearing focal osseous lesion or focal pathologic process. Soft tissues and spinal canal: No prevertebral fluid or swelling. No visible canal hematoma. Upper chest: Unremarkable. Other: Atherosclerotic plaque of the carotid arteries within neck. Partially visualized right central venous catheter. IMPRESSION: 1. No acute intracranial abnormality. 2. No acute displaced fracture or traumatic listhesis of the cervical spine. Electronically Signed   By: Tish Frederickson M.D.   On: 02/28/2023 19:06   CT Cervical Spine Wo Contrast  Result Date: 02/28/2023 CLINICAL DATA:  head trauma on AC; fall on thinners EXAM: CT HEAD WITHOUT CONTRAST CT CERVICAL SPINE WITHOUT CONTRAST TECHNIQUE: Multidetector CT imaging of the head and cervical spine was performed following the standard protocol without intravenous contrast. Multiplanar CT image reconstructions of the cervical spine were also generated. RADIATION DOSE REDUCTION: This exam was performed according to the departmental dose-optimization program which includes automated exposure control, adjustment of the mA and/or kV  according to patient size and/or use of iterative reconstruction technique. COMPARISON:  CT head and C-spine 04/29/2022 FINDINGS: CT HEAD FINDINGS Brain: Chronic left occipital infarction. Patchy and confluent areas of decreased attenuation are noted throughout the deep and periventricular white matter of the cerebral hemispheres bilaterally, compatible with chronic microvascular ischemic disease. No evidence of large-territorial acute infarction. No parenchymal hemorrhage. No mass lesion. No extra-axial collection. No mass effect or midline shift. No hydrocephalus. Basilar cisterns are patent. Vascular: No hyperdense vessel. Atherosclerotic calcifications are present within the cavernous internal carotid and vertebral arteries. Skull: No acute fracture or  focal lesion. Sinuses/Orbits: Paranasal sinuses and mastoid air cells are clear. The orbits are unremarkable. Other: None. CT CERVICAL SPINE FINDINGS Alignment: Normal. Skull base and vertebrae: Multilevel mild-to-moderate degenerative changes of the spine. No associated severe osseous neural foraminal stenosis. No acute fracture. No aggressive appearing focal osseous lesion or focal pathologic process. Soft tissues and spinal canal: No prevertebral fluid or swelling. No visible canal hematoma. Upper chest: Unremarkable. Other: Atherosclerotic plaque of the carotid arteries within neck. Partially visualized right central venous catheter. IMPRESSION: 1. No acute intracranial abnormality. 2. No acute displaced fracture or traumatic listhesis of the cervical spine. Electronically Signed   By: Tish Frederickson M.D.   On: 02/28/2023 19:06   DG Chest Port 1 View  Result Date: 02/28/2023 CLINICAL DATA:  Questionable sepsis-evaluate for abnormality EXAM: PORTABLE CHEST 1 VIEW COMPARISON:  02/05/2023 FINDINGS: Right IJ Port-A-Cath with tip in the right atrium. Stable cardiomediastinal silhouette. Aortic atherosclerotic calcification. Hyperinflation. Increased  interstitial coarsening in the right lower lung compared with 02/05/2023. No focal consolidation, pleural effusion, or pneumothorax. No displaced rib fractures. IMPRESSION: Increased interstitial coarsening in the right lower lung compared to 02/05/2023. Findings may be due to volume loss, infection, or edema. Electronically Signed   By: Minerva Fester M.D.   On: 02/28/2023 18:23    Medications: I have reviewed the patient's current medications.  Assessment/Plan:  Metastatic adenocarcinoma, likely esophagus primary - Liver mass, mass or lymph node conglomerate near the GE junction, gastrohepatic ligament and retroperitoneal lymphadenopathy. -02/11/2022 CTA chest-7.8 x 8.5 cm subtle hypoattenuating mass lesion in segment IV of the liver. -02/11/2022 CT abdomen/pelvis-8.7 x 7.8 or ill-defined irregular mass in the medial segment left liver, 4.5 x 3.4 cm necrotic mass lesion just cranial to the esophagogastric junction with metastatic lymphadenopathy in the abdomen and pelvis, irregular nodular peritoneal thickening in the lower abdomen and pelvis -02/11/2022 MRI abdomen-rim-enhancing likely necrotic mass or lymph node conglomerate centered around the gastroesophageal junction measuring 5.0 x 3.9 cm, large rim-enhancing likely necrotic liver mass centered in the anterior left lobe of the liver measuring 7.8 x 7.2 cm, enlarged gastrohepatic ligament and retroperitoneal lymph nodes -Labs from 02/11/2022-AFP 2.4, CA 19.9 88, CEA 287 -Ultrasound-guided liver biopsy performed 02/15/2022-adenocarcinoma with extensive necrosis, CK7 positive, negative for CK20, CDX2, TTF-1, and PAX8, consistent with a pancreatic, lung, or upper gastrointestinal primary -PET 03/08/2022-hypermetabolic distal esophagus mass, hypermetabolic centrally necrotic hepatic mass, hypermetabolic periportal nodes, exophytic GE junction mass without hypermetabolism-likely benign lesion, no hypermetabolism in the pancreas head, bilateral  hypermetabolic adrenal gland lesions, small hypermetabolic left parotid gland lesion.  No loss of mismatch repair protein expression, HER2 3+ -CTs 03/11/2022-no change from PET 03/08/2022, known distal esophagus mass-not visualized on unenhanced CT, large hepatic metastasis and upper abdominal nodal metastases, chronic pancreatitis with 4.3 cm pseudocyst in the pancreas head -Cycle 1 FOLFOX, trastuzumab, Pembrolizumab 04/06/2022 -Cycle 2 oxaliplatin, 5-FU bolus/pump and trastuzumab held due to hospitalization following cycle 1 with possible coronary vasospasm -Pembrolizumab 04/25/2022 -CT abdomen/pelvis 05/02/2022-increased peripancreatic edema, increased size of pancreas head pseudocyst, increased necrotic nodal masses at the GE junction -Cycle 3 oxaliplatin, trastuzumab, pembrolizumab 05/15/2022; 5-FU held secondary to cardiac toxicity following cycle 1 -Cycle 4 oxaliplatin, trastuzumab 05/29/2022, 5-FU held secondary to cardiac toxicity -Cycle 5 oxaliplatin, trastuzumab, Pembrolizumab (6-week dosing for Pembrolizumab) 06/12/2022, 5-FU held secondary to cardiac toxicity -Cycle 6 oxaliplatin, trastuzumab 06/28/2022, 5-FU held -Cycle 7 oxaliplatin, trastuzumab 07/17/2022, 5-FU held -CTs 07/19/2022-decrease in the dominant anterior liver mass, 2 new small liver lesions compared to 05/02/2022, new right  lower lobe nodule, enlargement of epicardial lymph nodes, slight decrease in size of celiac axis, gastropathic ligament, and portacaval lymphadenopathy, unchanged esophageal thickening -Cycle 8 oxaliplatin and pembrolizumab 08/14/2022, 5-FU and trastuzumab held secondary to potential cardiac toxicity -CTs 09/12/2022-increased number hepatic metastases, few are larger; multiple abnormal lymph nodes identified in the abdomen and lower mediastinum, relatively similar; increased stranding along the mesentery and retroperitoneum; persistent wall thickening of the duodenum with slight ectasia of the lumen; persistent left  hepatic lobe biliary duct dilatation, likely compressive from adjacent metastatic lesions; previous 6 mm right lower lobe lung nodule obscured by motion. -Cycle 1 Taxol/cyramza 09/20/2022 -Cycle 2 Taxol/Cyramza 10/04/2022 -Cycle 3 Taxol/Cyramza 10/19/2022 (Taxol dose reduced secondary to thrombocytopenia) -Cycle 4 Taxol/cyramza 11/16/2022 -Cycle 5 Taxol/Cyramza 11/30/2022 -CT chest emergency department 12/17/2022 due to complaint of dyspnea-no enlarged mediastinal, hilar or axillary lymph nodes.  New 7 mm lung nodule seen within the superior segment right lower lobe.  Additional 9 mm posterior right lower lobe lung nodule, previously measured 5 mm on prior study.  Small bilateral pleural effusions.  Multiple liver masses of various sizes, present on the prior exam.  4.6 x 3.7 low-attenuation extending from the posterior aspect of the left lobe of the liver to the region of the gastric fundus, previously measured 4.8 x 3.9 cm -Cycle 6 Taxol/Cyramza 01/08/2023 -CT chest 02/28/2023-slightly less conspicuous right upper lobe peripheral groundglass opacities, likely increase in size of multiple poorly defined hepatic masses, increase in size of a gastroesophageal/gastric fundus mass   2.  Myocardial infarction/cardiac arrest 01/31/2022 -Echocardiogram 02/01/2022-LVEF 60-65%, right ventricle severely dilated with hypokinesis in the basal and midportion, hypokinesis in the apical portion, right ventricular systolic function moderately reduced -Echocardiogram on 05/01/2022, LVEF 50-55%, decreased right ventricular systolic function, moderately enlarged right ventricle -Echocardiogram 08/05/2022-LVEF 45-50%, mildly decreased left ventricular function, moderate reduced right ventricular function 3.  Microcytic anemia -Labs from 02/11/2022-ferritin 151, iron 13, percent saturation 5% 4.  DVT/pulmonary embolism-bilateral lower extremity DVTs 01/10/2022, pulmonary emboli on chest CT 01/31/2022-apixaban 5.  PAD status post AKA in  2021 6.  RA 7.  Bilateral pleural effusions -Ultrasound-guided thoracentesis performed on 02/11/2022.  Cytology negative for malignancy. 8.  COPD 9.  Admission 03/11/2022 with E. coli bacteremia Ultrasound-guided biopsy/aspiration of dominant necrotic appearing liver lesion 03/17/2022-scant fluid aspirated-negative culture, no organisms or white cells.  Pathology with necrotic tissue. 10.  Admission 04/29/2022 after a fall, multiple skull fractures 11.  Fever, hypotension-Klebsiella bacteremia on blood culture 04/30/2022 12.  Admission 04/09/2022 with chest pain, NSTEMI 13.  Admission 08/04/2022 with ventricular tachycardia, status post cardioversion, noted to have hypokalemia and hypomagnesemia, started on amiodarone 14.  Rash following cycle 3 Taxol/ramucirumab-admitted to [facility] due to concern for SJS.  SJS and disseminated zoster ruled out. 15.  Admission 12/27/2022 with sepsis syndrome, probable aspiration pneumonia 16.  Admission 02/28/2023 with sepsis syndrome, blood culture positive for Klebsiella    Mr. Bachmann has metastatic adenocarcinoma, likely of gastroesophageal origin.  He has been treated with multiple systemic therapy agents including chemotherapy, immunotherapy, and targeted therapy.  Systemic treatment options are limited, but we could consider salvage systemic therapy with deruxtecan and other HER2 directed therapies.  I reviewed the admission chest CT images.  Is difficult to accurately measure the liver lesion and apparent gastroesophageal mass without contrast.  The previously noted lung lesion is not seen.  The CEA was stable in July.  He appears to have indolent metastatic adenocarcinoma.  The tumor burden is most likely slowly progressing.  Mr. Dewing  has multiple comorbid conditions including coronary artery disease, heart failure, and COPD.  He been admitted multiple times over the past year.  He is now admitted with gram-negative sepsis.  The source for infection is most likely  related to the abdominal tumor burden.  There is no evidence for a Port-A-Cath infection, pneumonia, or a urinary source.  I discussed the poor prognosis again today with Mr. Vandenbush and his friend..  I recommend hospice care.  Mr. Kenyon indicated he wants to continue treatment of the cancer.  I explained the small chance of clinical improvement with further systemic therapy.  The toxicity of treatment may outweigh any potential benefit.  We discussed CODE STATUS.  Mr. Schork remains undecided on CODE STATUS and home hospice care.  He is making arrangements for a home personal care assistant 4 hours/day.  It is unclear whether he would be able to have a home care assistant if enrolled in hospice care.  Recommendations: Antibiotics, management of sepsis syndrome per the critical care service Continue discussions regarding CODE STATUS and hospice care, agree with palliative care consult I will check on him 03/05/2023 Outpatient follow-up will be scheduled at the Cancer center Please call oncology as needed   LOS: 2 days   Thornton Papas, MD   03/02/2023, 1:25 PM

## 2023-03-02 NOTE — Consult Note (Signed)
Consultation Note Date: 03/02/2023   Patient Name: [name] [name]  DOB: 01-08-1961  MRN: [id number]  Age / Sex: 62 y.o., male  PCP: Rocky Morel, DO Referring Physician: Lynnell Catalan, MD  Reason for Consultation: Establishing goals of care  HPI/Patient Profile: 62 y.o. male  with past medical history of metastatic adenocarcinoma likely esophageal primary with metastatic burden to liver/abdomen diagnosed fall 2023, right heart failure with history of MI August 2023, DVT/PE July 2023 on Eliquis, PAD status post AKA 2021, RA, COPD, recent admission for sepsis secondary to aspiration pneumonia in July of this year admitted on 02/28/2023 with septic shock with hypotension found to be Klebsiella bacteremia.   Clinical Assessment and Goals of Care: I have reviewed medical records including EPIC notes, labs and imaging, received report from RN, assessed the patient.  [name] [name] is lying quietly in bed.  He appears acutely/chronically ill and quite frail.  He is resting comfortably, but wakes easily when I call his name.  He will make and mostly keep eye contact.  He is oriented to person and place, not month.  I do believe that he can make his basic needs known.  There is no family/friend at bedside at this time.  We meet at the bedside to discuss diagnosis prognosis, GOC, EOL wishes, disposition and options.  I introduced Palliative Medicine as specialized medical care for people living with serious illness. It focuses on providing relief from the symptoms and stress of a serious illness. The goal is to improve quality of life for both the patient and the family.  We discussed a brief life review of the patient.  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  He tells me that he never married and has no children.  His parents are deceased.  He does have 1 living sibling, a sister Kingsley Callander.  He tells me that he has lived with his friends [name] and Geoffry Paradise for the last 2 to 3 years.  He shares that they do assist him with ADLs at times, but he also states, "I help them".  We talked about possible need for short-term rehab, although [name] [name] prefers to return to his own home.  We then focused on their current illness.  I asked [name] [name] what is wrong, what has brought him to the hospital.  He is unclear.  I share that he has a blood infection.  We briefly talk about the treatment plan.  We also talk about his cancer treatment and oncology visit.  He is also unable to accurately tell me about oncology visit and the treatment plan.  He does share that he would accept further chemotherapy if offered.  We talked about time for outcomes.  The natural disease trajectory and expectations at EOL were discussed.  Advanced directives, concepts specific to code status, artifical feeding and hydration, and rehospitalization were briefly considered and discussed secondary to [name] [name] confusion.  I ask if he has had these discussions with  his healthcare surrogates, [name] and [name].  He tells me that he has not.  Bedside nursing staff states that Mr. Rainge was hard speaking with someone on the telephone related to CODE STATUS.  Call to friends/roommates [name] and Geoffry Paradise to discuss diagnosis prognosis, GOC, EOL wishes, disposition and options.  I introduced Palliative Medicine as specialized medical care for people living with serious illness. It focuses on providing relief from the symptoms and stress of a serious illness. The goal is to improve quality of life for both the patient and the family.  Call to friend/HCPOA, Richelle Ito.  She has her husband, [name] [name] on the phone.  [name] and Tammy reassure me that they had HCPOA completed including notarized during "Tim's" last admission July of this year.  This is also endorsed by [name].  HCPOA paperwork may be in medical  records awaiting upload. Overall, [name] is knowledgeable about Tim's acute and chronic health concerns.  She states that he has [facility] for at home services and Medicaid/care oncology which provides 20 hours of home care per week.  [name] states that she and her husband are able to help him with bathing and toileting.  She feels that they can care for him in his current state when he returns home. [name] shares that she understands from oncology that [name] [name] is not really a candidate for further chemotherapy and it is suggested that he take an home hospice.  [name] shares that [name] [name] would like more chemotherapy if offered.  She states it would probably be best for him to follow-up outpatient with trusted oncologist once he is more clear.  Overall, [name] seems realistic about his cancer burden.  We talked about the benefits of outpatient palliative and hospice care.  Provider choice offered.  [name] states that [facility], close to their home would be there provider of choice. We talk about CODE STATUS.  [name] [name] has shared with his HCPOA that he would want attempted resuscitation, "at least once".  I share that we will have ongoing conversations.  Discussed the importance of continued conversation with family and the medical providers regarding overall plan of care and treatment options, ensuring decisions are within the context of the patient's values and GOCs.  Questions and concerns were addressed.  The patient and friends were encouraged to call with questions or concerns.  PMT will continue to support holistically.  Conference with attending, oncology, bedside nursing staff, transition of care team related to patient condition, needs, goals of care, disposition.   HCPOA OTHER -[name] states that he would want his friends, [name] and Geoffry Paradise to be his healthcare surrogates.  He he has mentioned that he thought he completed this paperwork and provided it to the hospital.  He does have a MOST form on file  under ACP tab of epic chart.  I share that we will help him complete HCPOA paperwork.    SUMMARY OF RECOMMENDATIONS   At this point continue to treat the treatable PMT to follow-up 9/7   Code Status/Advance Care Planning: Full code -unable to have meaningful CODE STATUS discussions with Mr. Lamanna secondary to his orientation status.  PMT to continue to follow. HCPOA states that she has had discussions with Mr. Dobkin and he would like to try life support 1 time.  Symptom Management:  Per hospitalist/CCM, no additional needs at this time.  Palliative Prophylaxis:  Frequent Pain Assessment and Oral Care  Additional Recommendations (Limitations, Scope, Preferences): Full Scope Treatment at this point  would except further chemotherapy if offered  Psycho-social/Spiritual:  Desire for further Chaplaincy support:no Additional Recommendations: Caregiving  Support/Resources  Prognosis:  Unable to determine, based on outcomes.  6 months or less would not be surprising based on chronic illness burden, decreasing functional status, ability to take chemotherapy.  Discharge Planning: To Be Determined      Primary Diagnoses: Present on Admission:  Septic shock (HCC)   I have reviewed the medical record, interviewed the patient and family, and examined the patient. The following aspects are pertinent.  Past Medical History:  Diagnosis Date   Acute respiratory failure (HCC) 01/31/2022   Bilateral shoulder pain 08/20/2012   Bite wound of left hand 09/27/2021   CAD (coronary artery disease)    Occluded nondominent RCA managed medically - August 2023   Cardiac arrest [facility])    OOH VF - August 2023   Community acquired pneumonia 11/07/2011   Critical limb ischemia with history of revascularization of same extremity (HCC) 04/28/2020   DDD (degenerative disc disease), lumbosacral     and grade 2 slip   DVT (deep venous thrombosis) (HCC) 01/31/2022   Esophageal cancer (HCC)     Adenocarcinoma with metastasis   GERD (gastroesophageal reflux disease)    History of anemia    History of COVID-19 05/10/2019   History of critical lower limb ischemia 02/19/2019   Hypertension    Impingement syndrome of shoulder region    Ischemic ulcer of toe of left foot (HCC)    Liver mass    Low back pain without sciatica    Lupus (HCC)    Multiple fractures of ribs, bilateral, due to CPR trauma    Onychomycosis    Paronychia of great toe    Peripheral vascular disease (HCC)    Pressure injury of skin    Pulmonary embolus (HCC)    RA (rheumatoid arthritis) (HCC)    Rotator cuff tear 11/04/2014   Sternal fracture    Stroke [facility])    Social History   Socioeconomic History   Marital status: Single    Spouse name: Not on file   Number of children: 0   Years of education: Not on file   Highest education level: Not on file  Occupational History   Occupation: Disabled    Comment: 2/2 RA  Tobacco Use   Smoking status: Former    Current packs/day: 0.00    Average packs/day: 0.3 packs/day for 20.0 years (5.0 ttl pk-yrs)    Types: Cigarettes    Start date: 02/19/1999    Quit date: 02/19/2019    Years since quitting: 4.0   Smokeless tobacco: Never   Tobacco comments:    Patient stated he smokes a couple day and is attempting to quit smoking  Vaping Use   Vaping status: Never Used  Substance and Sexual Activity   Alcohol use: No    Alcohol/week: 0.0 standard drinks of alcohol    Comment: sober 1998   Drug use: No   Sexual activity: Not on file  Other Topics Concern   Not on file  Social History Narrative   On disability since 1992, used to work with city of [place]. Quit drinking in 1990.      Current Social History 11/05/2019        Patient lives by himself most of the time. Sometimes girlfriend's son stays with him in a one level home. There are 3 steps with handrail up to the entrance the patient uses.  Patient's method of transportation is personal truck.  This was recently vandalized and patient doesn't have funds to repair at this time.      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      Identified important Relationships are "My girlfriend, Selena Batten."       Pets : American Terrier Archivist), Lab (Roxy)       Interests / Fun: Walk, watch TV       Current Stressors: "Going through a lot the last 6-7 years; I worry too much about my health." (Discussed IBH, patient not interested at this time.)      Religious / Personal Beliefs: Baptist       L. Ducatte, BSN, RN-BC    Social Determinants of Health   Financial Resource Strain: Low Risk  (11/10/2022)   Overall Financial Resource Strain (CARDIA)    Difficulty of Paying Living Expenses: Not hard at all  Food Insecurity: No Food Insecurity (11/10/2022)   Hunger Vital Sign    Worried About Running Out of Food in the Last Year: Never true    Ran Out of Food in the Last Year: Never true  Transportation Needs: Unmet Transportation Needs (11/10/2022)   PRAPARE - Administrator, Civil Service (Medical): Yes    Lack of Transportation (Non-Medical): Yes  Physical Activity: Insufficiently Active (11/10/2022)   Exercise Vital Sign    Days of Exercise per Week: 2 days    Minutes of Exercise per Session: 10 min  Stress: No Stress Concern Present (11/10/2022)   [employer] of Occupational Health - Occupational Stress Questionnaire    Feeling of Stress : Not at all  Social Connections: Moderately Isolated (11/10/2022)   Social Connection and Isolation Panel [NHANES]    Frequency of Communication with Friends and Family: Twice a week    Frequency of Social Gatherings with Friends and Family: More than three times a week    Attends Religious Services: Never    Database administrator or Organizations: No    Attends Engineer, structural: Never    Marital Status: Living with partner   Family History  Problem Relation Age of Onset    Heart attack Father 71   Alcohol abuse Father    Aneurysm Mother    Stroke Neg Hx    Cancer Neg Hx    Scheduled Meds:  Chlorhexidine Gluconate Cloth  6 each Topical Daily   clopidogrel  75 mg Oral Daily   feeding supplement  237 mL Oral TID BM   ferrous sulfate  325 mg Oral Q breakfast   folic acid  1 mg Oral Daily   Gerhardt's butt cream   Topical TID   heparin  5,000 Units Subcutaneous Q8H   insulin aspart  0-9 Units Subcutaneous TID WC   lidocaine-prilocaine  1 Application Topical UD   mometasone-formoterol  2 puff Inhalation BID   oxyCODONE  10 mg Oral Q6H   polyethylene glycol  17 g Oral Daily   senna-docusate  1 tablet Oral BID   sodium chloride flush  10-40 mL Intracatheter Q12H   Continuous Infusions:  sodium chloride 250 mL (02/28/23 1725)   cefTRIAXone (ROCEPHIN)  IV Stopped (03/01/23 1748)   norepinephrine (LEVOPHED) Adult infusion Stopped (03/02/23 0327)   vasopressin Stopped (03/02/23 1131)   PRN Meds:.albuterol, docusate sodium, DULoxetine, fentaNYL (SUBLIMAZE) injection, ondansetron (ZOFRAN) IV, mouth rinse, polyethylene glycol, prochlorperazine, sodium chloride flush Medications  Prior to Admission:  Prior to Admission medications   Medication Sig Start Date End Date Taking? Authorizing Provider  albuterol (VENTOLIN HFA) 108 (90 Base) MCG/ACT inhaler INHALE 2 PUFFS INTO THE LUNGS EVERY 6 (SIX) HOURS AS NEEDED FOR WHEEZING OR SHORTNESS OF BREATH. 01/29/23  Yes Ladene Artist, MD  amiodarone (PACERONE) 200 MG tablet Take 1 tablet (200 mg total) by mouth 2 (two) times daily. 09/27/22 09/27/23 Yes Adron Bene, MD  amLODipine (NORVASC) 10 MG tablet Take 1 tablet (10 mg total) by mouth daily. 08/09/22 02/28/23 Yes Carrion-Carrero, Margely, MD  apixaban (ELIQUIS) 5 MG TABS tablet TAKE 1 TABLET (5 MG TOTAL) BY MOUTH 2 (TWO) TIMES DAILY. 02/20/23  Yes Rocky Morel, DO  atorvastatin (LIPITOR) 40 MG tablet Take 1 tablet (40 mg total) by mouth daily at 6 PM. 09/14/22 02/28/23 Yes  Rocky Morel, DO  clopidogrel (PLAVIX) 75 MG tablet Take 75 mg by mouth daily. 09/14/22  Yes [provider]  diclofenac Sodium (VOLTAREN) 1 % GEL Apply 2 g topically 4 (four) times daily. 08/09/22  Yes Carrion-Carrero, Karle Starch, MD  DULoxetine (CYMBALTA) 30 MG capsule Take 30 mg by mouth daily as needed (sleep). 06/21/22  Yes [provider]  feeding supplement (ENSURE ENLIVE / ENSURE PLUS) LIQD Take 237 mLs by mouth 3 (three) times daily between meals. 08/09/22  Yes Carrion-Carrero, Margely, MD  fluticasone-salmeterol (ADVAIR HFA) [phone] MCG/ACT inhaler Inhale 2 puffs into the lungs 2 (two) times daily. 10/03/22  Yes Adron Bene, MD  folic acid (FOLVITE) 1 MG tablet Take 1 mg by mouth daily.   Yes [provider]  gabapentin (NEURONTIN) 300 MG capsule Take 1 capsule (300 mg total) by mouth 2 (two) times daily. Daily or twice daily if tolerated. 11/09/22  Yes Rocky Morel, DO  hydrOXYzine (ATARAX) 25 MG tablet Take 25 mg by mouth at bedtime. 02/12/23  Yes [provider]  isosorbide mononitrate (IMDUR) 30 MG 24 hr tablet Take 0.5 tablets (15 mg total) by mouth daily. 02/05/23 03/07/23 Yes Trifan, Kermit Balo, MD  lidocaine-prilocaine (EMLA) cream Apply 1 Application topically as directed. Apply to port site 2 hours prior to stick and cover with Press-and-Seal to numb port before access 05/29/22  Yes Ladene Artist, MD  loperamide (IMODIUM A-D) 2 MG tablet Take 2 mg by mouth 4 (four) times daily as needed for diarrhea or loose stools.   Yes [provider]  meloxicam (MOBIC) 7.5 MG tablet Take 1 tablet (7.5 mg total) by mouth daily as needed for pain. 11/30/22 03/30/23 Yes Rocky Morel, DO  [facility] powder Apply 1 Application topically 2 (two) times daily. 02/23/23  Yes [provider]  ondansetron (ZOFRAN) 8 MG tablet START TAKING 72 HOURS AFTER CHEMOTHERAPY TREATMENT. Patient taking differently: Take 8 mg by mouth every 8 (eight) hours as needed for  vomiting or nausea. 02/14/23  Yes Ladene Artist, MD  Oxycodone HCl 10 MG TABS Take 10 mg by mouth every 6 (six) hours. 09/14/22  Yes [provider]  potassium chloride (KLOR-CON) 10 MEQ tablet TAKE 2 TABLETS(20 MEQ) BY MOUTH DAILY 02/07/23  Yes Ladene Artist, MD  prochlorperazine (COMPAZINE) 10 MG tablet TAKE 1 TABLET (10 MG TOTAL) BY MOUTH EVERY 6 (SIX) HOURS AS NEEDED. 01/29/23  Yes Ladene Artist, MD  ferrous sulfate 325 (65 FE) MG tablet Take 1 tablet (325 mg total) by mouth daily with breakfast for 100 doses. 11/30/22 03/10/23  Rocky Morel, DO  methotrexate (RHEUMATREX) 2.5 MG tablet Take 3  tablets (7.5 mg total) by mouth once a week. Caution:Chemotherapy. Protect from light. Patient not taking: Reported on 02/28/2023 10/17/22   Rocky Morel, DO   Allergies  Allergen Reactions   Dilaudid [Hydromorphone Hcl] Rash   Ramipril Rash and Other (See Comments)    Per patient, rash was on the right arm and leg only; no angioedema   Tramadol Itching and Rash   Review of Systems  Unable to perform ROS: Acuity of condition    Physical Exam Vitals and nursing note reviewed.  Constitutional:      General: He is not in acute distress.    Appearance: He is ill-appearing.  HENT:     Mouth/Throat:     Mouth: Mucous membranes are moist.  Cardiovascular:     Rate and Rhythm: Normal rate.  Pulmonary:     Effort: Pulmonary effort is normal. No respiratory distress.  Skin:    General: Skin is warm and dry.     Coloration: Skin is pale.  Neurological:     Mental Status: He is alert.     Comments: Oriented to person and place, thinks it may.  Psychiatric:        Mood and Affect: Mood normal.        Behavior: Behavior normal.     Comments: Calm and cooperative, not fearful     Vital Signs: BP 90/65   Pulse 65   Temp 98.7 F (37.1 C) (Oral)   Resp 12   Wt 56.6 kg   SpO2 94%   BMI 18.97 kg/m  Pain Scale: 0-10   Pain Score: 0-No pain   SpO2: SpO2: 94 % O2 Device:SpO2:  94 % O2 Flow Rate: .   IO: Intake/output summary:  Intake/Output Summary (Last 24 hours) at 03/02/2023 1246 Last data filed at 03/02/2023 1200 Gross per 24 hour  Intake 1125.41 ml  Output 400 ml  Net 725.41 ml    LBM: Last BM Date :  (PTA) Baseline Weight: Weight: 61.2 kg Most recent weight: Weight: 56.6 kg     Palliative Assessment/Data:     Time In: 1045 Time Out: 1200 Time Total: 75 minutes  Greater than 50%  of this time was spent counseling and coordinating care related to the above assessment and plan.  Signed by: Katheran Awe, NP   Please contact Palliative Medicine Team phone at [phone] for questions and concerns.  For individual provider: See Loretha Stapler

## 2023-03-03 DIAGNOSIS — A419 Sepsis, unspecified organism: Secondary | ICD-10-CM | POA: Diagnosis not present

## 2023-03-03 DIAGNOSIS — L8990 Pressure ulcer of unspecified site, unspecified stage: Secondary | ICD-10-CM | POA: Insufficient documentation

## 2023-03-03 DIAGNOSIS — R6521 Severe sepsis with septic shock: Secondary | ICD-10-CM | POA: Diagnosis not present

## 2023-03-03 DIAGNOSIS — Z7189 Other specified counseling: Secondary | ICD-10-CM

## 2023-03-03 DIAGNOSIS — R7881 Bacteremia: Secondary | ICD-10-CM

## 2023-03-03 DIAGNOSIS — Z515 Encounter for palliative care: Secondary | ICD-10-CM | POA: Diagnosis not present

## 2023-03-03 LAB — CBC
HCT: 31.9 % — ABNORMAL LOW (ref 39.0–52.0)
Hemoglobin: 10.1 g/dL — ABNORMAL LOW (ref 13.0–17.0)
MCH: 27.7 pg (ref 26.0–34.0)
MCHC: 31.7 g/dL (ref 30.0–36.0)
MCV: 87.6 fL (ref 80.0–100.0)
Platelets: 112 10*3/uL — ABNORMAL LOW (ref 150–400)
RBC: 3.64 MIL/uL — ABNORMAL LOW (ref 4.22–5.81)
RDW: 17.1 % — ABNORMAL HIGH (ref 11.5–15.5)
WBC: 5.5 10*3/uL (ref 4.0–10.5)
nRBC: 0 % (ref 0.0–0.2)

## 2023-03-03 LAB — GLUCOSE, CAPILLARY
Glucose-Capillary: 95 mg/dL (ref 70–99)
Glucose-Capillary: 96 mg/dL (ref 70–99)
Glucose-Capillary: 102 mg/dL — ABNORMAL HIGH (ref 70–99)
Glucose-Capillary: 97 mg/dL (ref 70–99)
Glucose-Capillary: 90 mg/dL (ref 70–99)

## 2023-03-03 LAB — PHOSPHORUS: Phosphorus: 2.5 mg/dL (ref 2.5–4.6)

## 2023-03-03 LAB — BASIC METABOLIC PANEL
Anion gap: 3 — ABNORMAL LOW (ref 5–15)
BUN: 14 mg/dL (ref 8–23)
CO2: 25 mmol/L (ref 22–32)
Calcium: 7.2 mg/dL — ABNORMAL LOW (ref 8.9–10.3)
Chloride: 105 mmol/L (ref 98–111)
Creatinine, Ser: 0.8 mg/dL (ref 0.61–1.24)
GFR, Estimated: >60 mL/min (ref >60)
Glucose, Bld: 106 mg/dL — ABNORMAL HIGH (ref 70–99)
Potassium: 3.6 mmol/L (ref 3.5–5.1)
Sodium: 133 mmol/L — ABNORMAL LOW (ref 135–145)

## 2023-03-03 MED ORDER — APIXABAN 5 MG PO TABS
5.0000 mg | ORAL_TABLET | Freq: Two times a day (BID) | ORAL | Status: DC
  Administered 2023-03-03 – 2023-03-07 (×9): 5 mg via ORAL
  Filled 2023-03-03 (×7): qty 1
  Filled 2023-03-03: qty 2
  Filled 2023-03-03: qty 1

## 2023-03-03 MED ORDER — ACETAMINOPHEN 325 MG PO TABS
ORAL_TABLET | ORAL | Status: AC
  Administered 2023-03-03: 650 mg via ORAL
  Filled 2023-03-03: qty 2

## 2023-03-03 MED ORDER — ACETAMINOPHEN 325 MG PO TABS
650.0000 mg | ORAL_TABLET | Freq: Four times a day (QID) | ORAL | Status: DC | PRN
  Administered 2023-03-03 – 2023-03-05 (×2): 650 mg via ORAL
  Filled 2023-03-03 (×2): qty 2

## 2023-03-03 NOTE — Evaluation (Signed)
Physical Therapy Evaluation Patient Details Name: [name] [name] MRN: [id number] DOB: [birth year] Today's Date: 03/03/2023  History of Present Illness  62 y.o male presented on 9/4 via EMS 2/2 AMS and fall, noted to be hypotensive and bradycardic. Admitted to ICU for pressor requirement, did not have respiratory distress. Found to have Klebsiella bacteremia and currently on IV antibiotics. 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 Pt to benefit from acute PT to address deficits. Pt requiring mod-max +2 assist for transfer-level mobility, unable to transfer away from EOB given weakness. At baseline pt can stand pivot or use transfer board for transfers without physical assist. Patient will benefit from continued inpatient follow up therapy, <3 hours/day, pt expresses interest in d/c home if possible. PT to progress mobility as tolerated, and will continue to follow acutely.          If plan is discharge home, recommend the following: A lot of help with walking and/or transfers;A lot of help with bathing/dressing/bathroom   Can travel by private vehicle   No    Equipment Recommendations None recommended by PT  Recommendations for Other Services       Functional Status Assessment Patient has had a recent decline in their functional status and demonstrates the ability to make significant improvements in function in a reasonable and predictable amount of time.     Precautions / Restrictions Precautions Precautions: Fall Precaution Comments: L AKA Required Braces or Orthoses:  (has prosthetic, does not wear it daily) Restrictions Weight Bearing Restrictions: No      Mobility  Bed Mobility Overal bed mobility: Needs Assistance Bed Mobility: Supine to Sit,  Sit to Supine     Supine to sit: Mod assist, +2 for safety/equipment, +2 for physical assistance Sit to supine: Max assist, +2 for physical assistance, +2 for safety/equipment   General bed mobility comments: assist for trunk and LE management, scoot to/from EOB    Transfers Overall transfer level: Needs assistance Equipment used: Rolling walker (2 wheels) Transfers: Sit to/from Stand Sit to Stand: Max assist, +2 physical assistance, +2 safety/equipment           General transfer comment: flexed posture, assist for power up,rise, steady, and unable to rise fully. standing tolerance x15 seconds    Ambulation/Gait               General Gait Details: unable to participate in pre-gait or gait  Stairs            Wheelchair Mobility     Tilt Bed    Modified Rankin (Stroke Patients Only)       Balance Overall balance assessment: Needs assistance Sitting-balance support: Feet supported Sitting balance-Leahy Scale: Fair     Standing balance support: Bilateral upper extremity supported, Reliant on assistive device for balance Standing balance-Leahy Scale: Poor                               Pertinent Vitals/Pain Pain Assessment Pain Assessment: Faces Faces Pain Scale: Hurts little more Pain Location: buttocks Pain Descriptors / Indicators: Grimacing, Guarding, Discomfort Pain Intervention(s): Limited activity within patient's tolerance, Monitored during session, Repositioned    Home Living Family/patient expects to be discharged to:: Private residence Living Arrangements: Non-relatives/Friends Available Help at Discharge:  Friend(s);Personal care attendant;Available 24 hours/day Type of Home: House Home Access: Ramped entrance       Home Layout: One level Home Equipment: Tub bench;Grab bars - tub/shower;Rolling Walker (2 wheels);Wheelchair - manual;BSC/3in1;Hand held shower head      Prior Function Prior Level of Function : Needs  assist             Mobility Comments: roommates assist with transfers as needed; pt able to self propel WC. Reports 2-3 falls in the past month. Stand pivots with RW to [facility], slideboard transfers to toilet ADLs Comments: roommates assist with ADLs as needed, driving, IADLs     Extremity/Trunk Assessment   Upper Extremity Assessment Upper Extremity Assessment: Defer to OT evaluation RUE Deficits / Details: severe RA, minimal movement of digits, unable to get to full elbow extension. limited shoulder ROM RUE Coordination: decreased fine motor;decreased gross motor LUE Deficits / Details: severe RA, minimal movement of digits, unable to get to full elbow extension. limited shoulder ROM LUE Coordination: decreased gross motor;decreased fine motor    Lower Extremity Assessment Lower Extremity Assessment: Generalized weakness    Cervical / Trunk Assessment Cervical / Trunk Assessment: Kyphotic  Communication   Communication Communication: Hearing impairment  Cognition Arousal: Alert Behavior During Therapy: Flat affect Overall Cognitive Status: No family/caregiver present to determine baseline cognitive functioning                                 General Comments: flat affect with blank stare noted, need simple 1 step directions and cues for attention at times. Limited insight to deficits and safety        General Comments General comments (skin integrity, edema, etc.): vss, pt complaining of nausea at start and midsession, premedicated for nausea by RN    Exercises     Assessment/Plan    PT Assessment Patient needs continued PT services  PT Problem List Decreased strength;Decreased mobility;Decreased safety awareness;Decreased activity tolerance;Decreased balance;Decreased knowledge of use of DME;Pain;Decreased cognition;Decreased knowledge of precautions       PT Treatment Interventions DME instruction;Therapeutic activities;Gait training;Therapeutic  exercise;Patient/family education;Balance training;Functional mobility training;Neuromuscular re-education    PT Goals (Current goals can be found in the Care Plan section)  Acute Rehab PT Goals Patient Stated Goal: go home with support of roommates PT Goal Formulation: With patient Time For Goal Achievement: 03/17/23 Potential to Achieve Goals: Fair    Frequency Min 1X/week     Co-evaluation PT/OT/SLP Co-Evaluation/Treatment: Yes Reason for Co-Treatment: Complexity of the patient's impairments (multi-system involvement);For patient/therapist safety;To address functional/ADL transfers PT goals addressed during session: Mobility/safety with mobility;Balance OT goals addressed during session: ADL's and self-care       AM-PAC PT "6 Clicks" Mobility  Outcome Measure Help needed turning from your back to your side while in a flat bed without using bedrails?: A Lot Help needed moving from lying on your back to sitting on the side of a flat bed without using bedrails?: A Lot Help needed moving to and from a bed to a chair (including a wheelchair)?: A Lot Help needed standing up from a chair using your arms (e.g., wheelchair or bedside chair)?: Total Help needed to walk in hospital room?: Total Help needed climbing 3-5 steps with a railing? : Total 6 Click Score: 9    End of Session   Activity Tolerance: Patient limited by fatigue;Other (comment) (limited by nausea) Patient left: in bed;with call bell/phone within reach;with bed  alarm set;with nursing/sitter in room Nurse Communication: Mobility status PT Visit Diagnosis: Other abnormalities of gait and mobility (R26.89);Muscle weakness (generalized) (M62.81);History of falling (Z91.81)    Time: 8119-1478 PT Time Calculation (min) (ACUTE ONLY): 25 min   Charges:   PT Evaluation $PT Eval Moderate Complexity: 1 Mod   PT General Charges $$ ACUTE PT VISIT: 1 Visit         Marye Round, PT DPT Acute Rehabilitation Services Secure  Chat Preferred  Office [phone]   Marcella Dunnaway E Christain [place] 03/03/2023, 3:16 PM

## 2023-03-03 NOTE — Evaluation (Signed)
Occupational Therapy Evaluation Patient Details Name: [name] [name] MRN: [id number] DOB: 01-14-1961 Today's Date: 03/03/2023   History of Present Illness 62 y.o male presented on 9/4 via EMS 2/2 AMS and fall, noted to be hypotensive and bradycardic. Admitted to ICU for pressor requirement, did not have respiratory distress. Found to have Klebsiella bacteremia and currently on IV antibiotics. PMH: metastatic adenocarcinoma (currently on chemo), multiple DVT/PE on AC, HTN, HLD, CAD s/p CABG 2020, many recent admissions   Clinical Impression   Sklyer was evaluated s/p the above admission list. He needs assist with mobility and ADLs from his roommates at baseline. Pt mobilizes at wc level and completes SP or sideboard transfers. Upon evaluation the pt was limited by nausea, weakness, baseline RA contractures and poor activity tolerance. Overall he needed mod/max A +2 for bed mobility and max A +2 to stand EOB with RW. Due to the deficits listed below the pt also needs up to totals A for LB ADLs and max A for UB ADLs. Pt will benefit from continued acute OT services and skilled inpatient follow up therapy, <3 hours/day for safety and rehab - however, pt is hopeful to return home and states his roommates can assist him.         If plan is discharge home, recommend the following: A lot of help with walking and/or transfers;A lot of help with bathing/dressing/bathroom;Assistance with cooking/housework;Direct supervision/assist for medications management;Direct supervision/assist for financial management;Assist for transportation;Help with stairs or ramp for entrance    Functional Status Assessment  Patient has had a recent decline in their functional status and demonstrates the ability to make significant improvements in function in a reasonable and predictable amount of time.  Equipment Recommendations  None recommended by OT       Precautions / Restrictions Precautions Precautions: Fall Precaution  Comments: L AKA Required Braces or Orthoses:  (has prosthetic, does not wear it daily) Restrictions Weight Bearing Restrictions: No      Mobility Bed Mobility Overal bed mobility: Needs Assistance Bed Mobility: Supine to Sit, Sit to Supine     Supine to sit: Mod assist, +2 for safety/equipment, +2 for physical assistance Sit to supine: Max assist, +2 for physical assistance, +2 for safety/equipment        Transfers Overall transfer level: Needs assistance Equipment used: Rolling walker (2 wheels) Transfers: Sit to/from Stand Sit to Stand: Max assist, +2 physical assistance, +2 safety/equipment           General transfer comment: flexed posture      Balance Overall balance assessment: Needs assistance Sitting-balance support: Feet supported Sitting balance-Leahy Scale: Fair     Standing balance support: Bilateral upper extremity supported Standing balance-Leahy Scale: Poor                             ADL either performed or assessed with clinical judgement   ADL Overall ADL's : Needs assistance/impaired Eating/Feeding: Set up;Sitting   Grooming: Minimal assistance;Sitting   Upper Body Bathing: Maximal assistance   Lower Body Bathing: Maximal assistance   Upper Body Dressing : Maximal assistance   Lower Body Dressing: Total assistance;+2 for physical assistance;+2 for safety/equipment   Toilet Transfer: Maximal assistance;Stand-pivot   Toileting- Clothing Manipulation and Hygiene: Total assistance       Functional mobility during ADLs: Maximal assistance;+2 for physical assistance;+2 for safety/equipment General ADL Comments: limited by nausea and weakness     Vision Baseline Vision/History: 0 No visual  deficits Vision Assessment?: No apparent visual deficits     Perception Perception: Not tested       Praxis Praxis: Not tested       Pertinent Vitals/Pain Pain Assessment Pain Assessment: Faces Faces Pain Scale: Hurts a little  bit Pain Location: generalized Pain Descriptors / Indicators: Discomfort Pain Intervention(s): Monitored during session     Extremity/Trunk Assessment Upper Extremity Assessment Upper Extremity Assessment: RUE deficits/detail;LUE deficits/detail RUE Deficits / Details: severe RA, minimal movement of digits, unable to get to full elbow extension. limited shoulder ROM RUE Coordination: decreased fine motor;decreased gross motor LUE Deficits / Details: severe RA, minimal movement of digits, unable to get to full elbow extension. limited shoulder ROM LUE Coordination: decreased gross motor;decreased fine motor   Lower Extremity Assessment Lower Extremity Assessment: Defer to PT evaluation   Cervical / Trunk Assessment Cervical / Trunk Assessment: Kyphotic   Communication Communication Communication: Hearing impairment   Cognition Arousal: Alert Behavior During Therapy: Flat affect Overall Cognitive Status: No family/caregiver present to determine baseline cognitive functioning                                 General Comments: flat affect with blank stare noted, need simple 1 step directions and cues for attention at times. Limited insight to deficits and safety     General Comments  VSS, RN present    Exercises     Shoulder Instructions      Home Living Family/patient expects to be discharged to:: Private residence Living Arrangements: Non-relatives/Friends Available Help at Discharge: Friend(s);Personal care attendant;Available 24 hours/day Type of Home: House Home Access: Ramped entrance     Home Layout: One level     Bathroom Shower/Tub: Chief Strategy Officer: Standard Bathroom Accessibility: Yes   Home Equipment: Tub bench;Grab bars - tub/shower;Rolling Walker (2 wheels);Wheelchair - manual;BSC/3in1;Hand held shower head          Prior Functioning/Environment Prior Level of Function : Needs assist             Mobility  Comments: roommates assist with transfers as needed; pt able to self propel WC. Reports 2-3 falls in the past month. Stand pivots with RW to [facility], slideboard transfers to toilet ADLs Comments: roommates assist with ADLs as needed, driving, IADLs        OT Problem List: Decreased strength;Decreased activity tolerance;Decreased range of motion;Impaired balance (sitting and/or standing);Decreased safety awareness;Decreased knowledge of use of DME or AE;Decreased knowledge of precautions      OT Treatment/Interventions: Self-care/ADL training;Neuromuscular education;DME and/or AE instruction;Therapeutic activities;Patient/family education;Balance training    OT Goals(Current goals can be found in the care plan section) Acute Rehab OT Goals Patient Stated Goal: to feel better OT Goal Formulation: With patient Time For Goal Achievement: 03/17/23 Potential to Achieve Goals: Good ADL Goals Pt Will Perform Grooming: with set-up;sitting Pt Will Perform Upper Body Dressing: with set-up;sitting Pt Will Perform Lower Body Dressing: with mod assist;sit to/from stand Pt Will Transfer to Toilet: with min assist;stand pivot transfer Additional ADL Goal #1: pt will indep complete bed mobility with mod I as a precursor to ADLs  OT Frequency: Min 1X/week    Co-evaluation PT/OT/SLP Co-Evaluation/Treatment: Yes Reason for Co-Treatment: Complexity of the patient's impairments (multi-system involvement);For patient/therapist safety;To address functional/ADL transfers PT goals addressed during session: Mobility/safety with mobility;Balance OT goals addressed during session: ADL's and self-care      AM-PAC OT "6 Clicks" Daily  Activity     Outcome Measure Help from another person eating meals?: A Little Help from another person taking care of personal grooming?: A Little Help from another person toileting, which includes using toliet, bedpan, or urinal?: A Lot Help from another person bathing (including  washing, rinsing, drying)?: A Lot Help from another person to put on and taking off regular upper body clothing?: A Lot Help from another person to put on and taking off regular lower body clothing?: Total 6 Click Score: 13   End of Session Equipment Utilized During Treatment: Rolling walker (2 wheels) Nurse Communication: Mobility status  Activity Tolerance: Patient tolerated treatment well Patient left: in bed;with call bell/phone within reach;with bed alarm set;with nursing/sitter in room  OT Visit Diagnosis: Unsteadiness on feet (R26.81);Other abnormalities of gait and mobility (R26.89);Muscle weakness (generalized) (M62.81)                Time: 0981-1914 OT Time Calculation (min): 24 min Charges:  OT General Charges $OT Visit: 1 Visit OT Evaluation $OT Eval Moderate Complexity: 1 Mod  Derenda Mis, OTR/L Acute Rehabilitation Services Office [phone] Secure Chat Communication Preferred   Donia Pounds 03/03/2023, 3:13 PM

## 2023-03-03 NOTE — Plan of Care (Signed)

## 2023-03-03 NOTE — Plan of Care (Signed)

## 2023-03-03 NOTE — Progress Notes (Addendum)
NAME:  [name] [name], MRN:  [id number], DOB:  1961/02/21, LOS: 3 ADMISSION DATE:  02/28/2023, CONSULTATION DATE:  02/28/2023 REFERRING MD:  EDP, CHIEF COMPLAINT:  Septic Shock   History of Present Illness:  62 y.o male presented on 9/4 via EMS 2/2 AMS and fall, noted to be hypotensive and bradycardic. Admitted to ICU for pressor requirement, did not have respiratory distress. Found to have Klebsiella bacteremia and currently on IV antibiotics. GI unable to identify source, and do not see need for other interventions at this time- signed off. Still requiring minor pressor support. He met with Oncology yesterday, and no change in goals of care.  Pertinent  Medical History  Recent admission for sepsis 2/2 aspiration pna 12/2022 Metastatic adenocarcinoma likely esophageal cancer stage IV on chemo Esophageal dysmotility RH failure with h/o MI 01/2022 DVT/PE 12/2021 on eliquis PAD s/p AKA 2021 RA H/o COPD  Significant Hospital Events: Including procedures, antibiotic start and stop dates in addition to other pertinent events   Admitted to ICU on vasopressors 9/4  9/5 Klebsiella bacteremia. Transitioned to CTX and Flagyl. 9/6 palliative consult. On NE vaso  9/7 still on pressors   Interim History / Subjective:   He is still on pressors   Objective   Blood pressure (!) 87/69, pulse 70, temperature 98.4 F (36.9 C), temperature source Oral, resp. rate 10, weight 60 kg, SpO2 91%.        Intake/Output Summary (Last 24 hours) at 03/03/2023 1140 Last data filed at 03/03/2023 1000 Gross per 24 hour  Intake 678.88 ml  Output 1200 ml  Net -521.12 ml   Filed Weights   02/28/23 2045 03/01/23 0500 03/03/23 0242  Weight: 61.2 kg 56.6 kg 60 kg    Examination: General: frail chronically ill appearing older adult M NAD Neuro: AAOx4 HEENT: NCAT pink tacky mm CV: rr cap refill < 3 sec Pulm: even unlabored on RA GI: thin, no focal tenderness GU: no foley MSK: decr muscle mass, symmetrically    Resolved Hospital Problem list    Assessment & Plan:   Septic shock 2/2 klebsiella bacteremia  P -Cont rocephin -Wean NE   AoC R heart failure Cardiomyopathy  Hx VT/VF CAD  P -amio held w transaminitis  -cards rec no add'l diuresis or GDMT  -restart home eliquis   AKI -ordered BMP  Lactic acidosis -sepsis  + cancer burden -follow PRN  Metastatic (presumed) esophageal Cancer -onc has rec hospice -pt wants any/all offered tx   GOC DNR discussion -We had a candid talk about code status and he is thinking about this. Remains full code    Best Practice (right click and "Reselect all SmartList Selections" daily)   Diet/type: dysphagia diet (see orders) DVT prophylaxis: prophylactic heparin  GI prophylaxis: PPI Lines: N/A Foley:  N/A Code Status:  full code Last date of multidisciplinary goals of care discussion pt updated 9/7  Labs   CBC: Recent Labs  Lab 02/28/23 1641 02/28/23 1726 02/28/23 1739 03/01/23 0606 03/03/23 1100  WBC 9.9  --   --  18.6* 5.5  NEUTROABS 7.4  --   --   --   --   HGB 10.5* 10.9* 11.6* 11.4* 10.1*  HCT 34.6* 32.0* 34.0* 37.0* 31.9*  MCV 91.3  --   --  88.1 87.6  PLT 121*  --   --  223 112*    Basic Metabolic Panel: Recent Labs  Lab 02/28/23 1641 02/28/23 1726 02/28/23 1739 03/01/23 0606  NA 137 141 141  136  K 4.0 4.5 4.9 4.4  CL 111  --  110 105  CO2 19*  --   --  19*  GLUCOSE 150*  --  134* 147*  BUN 29*  --  29* 26*  CREATININE 1.95*  --  1.90* 1.62*  CALCIUM 7.3*  --   --  8.4*  MG 1.3*  --   --  1.2*   GFR: Estimated Creatinine Clearance: 40.1 mL/min (A) (by C-G formula based on SCr of 1.62 mg/dL (H)). Recent Labs  Lab 02/28/23 1641 02/28/23 1654 02/28/23 2203 03/01/23 0008 03/01/23 0606 03/01/23 1346 03/03/23 1100  PROCALCITON 0.72  --   --   --   --   --   --   WBC 9.9  --   --   --  18.6*  --  5.5  LATICACIDVEN  --    < > 3.4* 4.1* 4.0* 2.3*  --    < > = values in this interval not displayed.     Liver Function Tests: Recent Labs  Lab 02/28/23 1641  AST 68*  ALT 41  ALKPHOS 265*  BILITOT 0.5  PROT 6.2*  ALBUMIN 2.2*   No results for input(s): "LIPASE", "AMYLASE" in the last 168 hours. No results for input(s): "AMMONIA" in the last 168 hours.  ABG    Component Value Date/Time   PHART 7.298 (L) 02/28/2023 1726   PCO2ART 33.2 02/28/2023 1726   PO2ART 412 (H) 02/28/2023 1726   HCO3 16.1 (L) 02/28/2023 1726   TCO2 17 (L) 02/28/2023 1739   ACIDBASEDEF 9.0 (H) 02/28/2023 1726   O2SAT 100 02/28/2023 1726     Coagulation Profile: No results for input(s): "INR", "PROTIME" in the last 168 hours.  Cardiac Enzymes: No results for input(s): "CKTOTAL", "CKMB", "CKMBINDEX", "TROPONINI" in the last 168 hours.  HbA1C: Hgb A1c MFr Bld  Date/Time Value Ref Range Status  02/28/2023 10:03 PM 5.0 4.8 - 5.6 % Final    Comment:    (NOTE) Pre diabetes:          5.7%-6.4%  Diabetes:              >6.4%  Glycemic control for   <7.0% adults with diabetes   02/03/2022 03:27 AM 5.1 4.8 - 5.6 % Final    Comment:    (NOTE) Pre diabetes:          5.7%-6.4%  Diabetes:              >6.4%  Glycemic control for   <7.0% adults with diabetes     CBG: Recent Labs  Lab 03/02/23 0748 03/02/23 1140 03/02/23 1708 03/02/23 2103 03/03/23 0821  GLUCAP 104* 103* 111* 108* 95    CRITICAL CARE Performed by: Lanier Clam   Total critical care time: 39 minutes  Critical care time was exclusive of separately billable procedures and treating other patients. Critical care was necessary to treat or prevent imminent or life-threatening deterioration.  Critical care was time spent personally by me on the following activities: development of treatment plan with patient and/or surrogate as well as nursing, discussions with consultants, evaluation of patient's response to treatment, examination of patient, obtaining history from patient or surrogate, ordering and performing treatments  and interventions, ordering and review of laboratory studies, ordering and review of radiographic studies, pulse oximetry and re-evaluation of patient's condition.  Tessie Fass MSN, AGACNP-BC [place] [facility] Amion for pager 03/03/2023, 11:40 AM

## 2023-03-03 NOTE — Progress Notes (Signed)
Palliative: Mr. Boyden, Branom, is resting quietly in bed.  He greets me, making and somewhat keeping eye contact.  He is calm and cooperative, pleasant, denying concerns.  He is oriented to person and place only, not month.  I do believe that he can make his basic needs known.  There is no family or friends at bedside at this time.  Bedside nursing staff is present attending to needs.  I asked Mr. Cornelson what has brought him to the hospital.  He states that he was having trouble with his chemotherapy.  We talk about his bacteremia, blood infection.  We talked about this yesterday but he did not remember.  We talked about the treatment plan of antibiotics, time for outcomes.  He had been off Levophed but had low blood pressure overnight which led to restarting of Levophed which has now been discontinued.  Hopefully he will progress over the next 24 to 48 hours and be ready for discharge back home soon.  HCPOA/caregiver, [name] and [name] have shared that they feel that they can care for team at home.  Medicaid/CAP aid services to start next week at 20 hours/week.  Also home health services.  There has been multiple discussions surrounding the benefits of hospice care which could provide RN services every week or 2, an aide to help with bathing 3 times per week.  Initially, Mr. Kosky will start with outpatient palliative services with [facility].  Oncology has recommended hospice type care.  Mr. Vonada would like more chemotherapy if offered.  Unfortunately, he is oriented to person and place only and is not able to fully understand that he is not likely to be offered further cancer treatment.  Thankfully his HCPOA, caregivers [name] and Babette Relic do understand that he will not be offered further chemotherapy.  It may be beneficial for Mr. Critchley to have a final meeting with his trusted oncologist once he has improved and is discharged.  Conference with attending, bedside nursing staff, transition of care team  related to patient condition, needs, goals of care, disposition.  Plan: At this point continue full scope/full code.  Anticipate home with home health services with CAP aid services (20 hours per week) to start next week.  Outpatient palliative services with [facility] [place] office.    50 minutes Lillia Carmel, NP Palliative medicine team Team phone [phone] Greater than 50% of this time was spent counseling and coordinating care related to the above assessment and plan.

## 2023-03-04 DIAGNOSIS — A419 Sepsis, unspecified organism: Secondary | ICD-10-CM | POA: Diagnosis not present

## 2023-03-04 DIAGNOSIS — R6521 Severe sepsis with septic shock: Secondary | ICD-10-CM | POA: Diagnosis not present

## 2023-03-04 LAB — GLUCOSE, CAPILLARY
Glucose-Capillary: 70 mg/dL (ref 70–99)
Glucose-Capillary: 72 mg/dL (ref 70–99)
Glucose-Capillary: 78 mg/dL (ref 70–99)
Glucose-Capillary: 66 mg/dL — ABNORMAL LOW (ref 70–99)
Glucose-Capillary: 85 mg/dL (ref 70–99)

## 2023-03-04 LAB — BASIC METABOLIC PANEL
Anion gap: 9 (ref 5–15)
BUN: 15 mg/dL (ref 8–23)
CO2: 23 mmol/L (ref 22–32)
Calcium: 7.7 mg/dL — ABNORMAL LOW (ref 8.9–10.3)
Chloride: 105 mmol/L (ref 98–111)
Creatinine, Ser: 0.85 mg/dL (ref 0.61–1.24)
GFR, Estimated: >60 mL/min (ref >60)
Glucose, Bld: 80 mg/dL (ref 70–99)
Potassium: 3.3 mmol/L — ABNORMAL LOW (ref 3.5–5.1)
Sodium: 137 mmol/L (ref 135–145)

## 2023-03-04 LAB — CBC
HCT: 30.3 % — ABNORMAL LOW (ref 39.0–52.0)
Hemoglobin: 9.4 g/dL — ABNORMAL LOW (ref 13.0–17.0)
MCH: 26.9 pg (ref 26.0–34.0)
MCHC: 31 g/dL (ref 30.0–36.0)
MCV: 86.8 fL (ref 80.0–100.0)
Platelets: 91 10*3/uL — ABNORMAL LOW (ref 150–400)
RBC: 3.49 MIL/uL — ABNORMAL LOW (ref 4.22–5.81)
RDW: 17.2 % — ABNORMAL HIGH (ref 11.5–15.5)
WBC: 3.6 10*3/uL — ABNORMAL LOW (ref 4.0–10.5)
nRBC: 0 % (ref 0.0–0.2)

## 2023-03-04 NOTE — Progress Notes (Signed)
NAME:  [name] [name], MRN:  [id number], DOB:  [birth year], LOS: 4 ADMISSION DATE:  02/28/2023, CONSULTATION DATE:  02/28/2023 REFERRING MD:  EDP, CHIEF COMPLAINT:  Septic Shock   History of Present Illness:  62 y.o male presented on 9/4 via EMS 2/2 AMS and fall, noted to be hypotensive and bradycardic. Admitted to ICU for pressor requirement, did not have respiratory distress. Found to have Klebsiella bacteremia and currently on IV antibiotics. GI unable to identify source, and do not see need for other interventions at this time- signed off. Still requiring minor pressor support. He met with Oncology yesterday, and no change in goals of care.  Pertinent  Medical History  Recent admission for sepsis 2/2 aspiration pna 12/2022 Metastatic adenocarcinoma likely esophageal cancer stage IV on chemo Esophageal dysmotility RH failure with h/o MI 01/2022 DVT/PE 12/2021 on eliquis PAD s/p AKA 2021 RA H/o COPD  Significant Hospital Events: Including procedures, antibiotic start and stop dates in addition to other pertinent events   Admitted to ICU on vasopressors 9/4  9/5 Klebsiella bacteremia. Transitioned to CTX and Flagyl. 9/6 palliative consult. On NE vaso  9/7 still on pressors   Interim History / Subjective:  Feels better, feels stronger Able to work with PT although he cannot ambulate far Tolerating p.o.  Objective   Blood pressure 104/72, pulse 69, temperature 97.7 F (36.5 C), temperature source Oral, resp. rate 19, weight 60.2 kg, SpO2 95%.        Intake/Output Summary (Last 24 hours) at 03/04/2023 1547 Last data filed at 03/03/2023 1600 Gross per 24 hour  Intake --  Output 410 ml  Net -410 ml   Filed Weights   03/01/23 0500 03/03/23 0242 03/04/23 0500  Weight: 56.6 kg 60 kg 60.2 kg    Examination: General: Elderly ill-appearing man laying in bed in no distress Neuro: Awake and alert, answers questions, interacts appropriately, stronger on 9/8.  Moves all extremities HEENT:  Oropharynx clear, no secretions, strong voice CV: Regular, distant, no murmur Pulm: No wheezes or crackles GI: Soft, nondistended with positive bowel sounds MSK: Decreased muscle mass no deformity  Resolved Hospital Problem list   Lactic acidosis Acute renal failure  Assessment & Plan:   Septic shock 2/2 klebsiella bacteremia, improved P -Continue ceftriaxone  AoC R heart failure Cardiomyopathy  Hx VT/VF CAD  P -Amiodarone held in the setting of transaminitis, may be able to restart prior to discharge -No further GDMT or diuresis recommended by cardiology -Continue home Eliquis  Metastatic (presumed) esophageal Cancer -Oncology has seen the patient, recommending hospice care as a few further options for treatment.  The patient is interested in outpatient follow-up, discussion about any possible therapies that would be offered  GOC DNR discussion -Patient has expressed desire to be full code   Best Practice (right click and "Reselect all SmartList Selections" daily)   Diet/type: dysphagia diet (see orders) DVT prophylaxis: prophylactic heparin  GI prophylaxis: PPI Lines: N/A Foley:  N/A Code Status:  full code Last date of multidisciplinary goals of care discussion pt updated 9/7  Labs   CBC: Recent Labs  Lab 02/28/23 1641 02/28/23 1726 02/28/23 1739 03/01/23 0606 03/03/23 1100 03/04/23 0353  WBC 9.9  --   --  18.6* 5.5 3.6*  NEUTROABS 7.4  --   --   --   --   --   HGB 10.5* 10.9* 11.6* 11.4* 10.1* 9.4*  HCT 34.6* 32.0* 34.0* 37.0* 31.9* 30.3*  MCV 91.3  --   --  88.1 87.6 86.8  PLT 121*  --   --  223 112* 91*    Basic Metabolic Panel: Recent Labs  Lab 02/28/23 1641 02/28/23 1726 02/28/23 1739 03/01/23 0606 03/03/23 1100 03/04/23 0353  NA 137 141 141 136 133* 137  K 4.0 4.5 4.9 4.4 3.6 3.3*  CL 111  --  110 105 105 105  CO2 19*  --   --  19* 25 23  GLUCOSE 150*  --  134* 147* 106* 80  BUN 29*  --  29* 26* 14 15  CREATININE 1.95*  --  1.90*  1.62* 0.80 0.85  CALCIUM 7.3*  --   --  8.4* 7.2* 7.7*  MG 1.3*  --   --  1.2*  --   --   PHOS  --   --   --   --  2.5  --    GFR: Estimated Creatinine Clearance: 76.7 mL/min (by C-G formula based on SCr of 0.85 mg/dL). Recent Labs  Lab 02/28/23 1641 02/28/23 1654 02/28/23 2203 03/01/23 0008 03/01/23 0606 03/01/23 1346 03/03/23 1100 03/04/23 0353  PROCALCITON 0.72  --   --   --   --   --   --   --   WBC 9.9  --   --   --  18.6*  --  5.5 3.6*  LATICACIDVEN  --    < > 3.4* 4.1* 4.0* 2.3*  --   --    < > = values in this interval not displayed.    Liver Function Tests: Recent Labs  Lab 02/28/23 1641  AST 68*  ALT 41  ALKPHOS 265*  BILITOT 0.5  PROT 6.2*  ALBUMIN 2.2*   No results for input(s): "LIPASE", "AMYLASE" in the last 168 hours. No results for input(s): "AMMONIA" in the last 168 hours.  ABG    Component Value Date/Time   PHART 7.298 (L) 02/28/2023 1726   PCO2ART 33.2 02/28/2023 1726   PO2ART 412 (H) 02/28/2023 1726   HCO3 16.1 (L) 02/28/2023 1726   TCO2 17 (L) 02/28/2023 1739   ACIDBASEDEF 9.0 (H) 02/28/2023 1726   O2SAT 100 02/28/2023 1726     Coagulation Profile: No results for input(s): "INR", "PROTIME" in the last 168 hours.  Cardiac Enzymes: No results for input(s): "CKTOTAL", "CKMB", "CKMBINDEX", "TROPONINI" in the last 168 hours.  HbA1C: Hgb A1c MFr Bld  Date/Time Value Ref Range Status  02/28/2023 10:03 PM 5.0 4.8 - 5.6 % Final    Comment:    (NOTE) Pre diabetes:          5.7%-6.4%  Diabetes:              >6.4%  Glycemic control for   <7.0% adults with diabetes   02/03/2022 03:27 AM 5.1 4.8 - 5.6 % Final    Comment:    (NOTE) Pre diabetes:          5.7%-6.4%  Diabetes:              >6.4%  Glycemic control for   <7.0% adults with diabetes     CBG: Recent Labs  Lab 03/03/23 1605 03/03/23 1752 03/03/23 2142 03/04/23 0802 03/04/23 1227  GLUCAP 102* 97 90 70 72    Levy Pupa, MD, PhD 03/04/2023, 3:47 PM [place]  [facility] [phone] or if no answer before 7:00PM call [phone] For any issues after 7:00PM please call eLink [phone]

## 2023-03-04 NOTE — TOC Initial Note (Signed)
Transition of Care [facility]) - Initial/Assessment Note    Patient Details  Name: [name] [name] MRN: [id number] Date of Birth: 02-07-1961  Transition of Care [facility]) CM/SW Contact:    Baldemar Lenis, LCSW Phone Number: 03/04/2023, 10:18 AM  Clinical Narrative:       CSW spoke with patient's HCPOA and roommates [name] and [name] to discuss disposition. [name] and [name] would like to take the patient home, he is already active with [facility] and will have [facility] starting on Tuesday or Wednesday for additional caregiver support 4 hours a day. CSW discussed with [name] and [name] the patient being unable to get up out of bed with therapy and the patient is already basically bedbound; he only gets up in his wheelchair for meals. [name] and Bennett asking for a lift to assist with getting the patient into his wheelchair, as he has been able to do that better in the past. Patient already has hospital bed, slide board, wheelchair, 3N1, and shower chair. CSW confirmed with [facility] that patient is active with PT, OT, RN, and aide. CSW to follow.   Expected Discharge Plan: Home w Home Health Services Barriers to Discharge: Continued Medical Work up   Patient Goals and CMS Choice Patient states their goals for this hospitalization and ongoing recovery are:: patient unable to participate in goal setting, not oriented CMS Medicare.gov Compare Post Acute Care list provided to:: Patient Represenative (must comment) Choice offered to / list presented to : [facility] POA / Guardian [place] ownership interest in [facility].provided to:: [facility] POA / Guardian    Expected Discharge Plan and Services     Post Acute Care Choice: Home Health, Durable Medical Equipment Living arrangements for the past 2 months: Single Family Home                           HH Arranged: RN, PT, OT, Nurse's Aide HH Agency: [facility] Date [facility] Agency Contacted: 03/04/23   Representative spoke with at [facility] Agency:  Rivka Barbara  Prior Living Arrangements/Services Living arrangements for the past 2 months: Single Family Home Lives with:: Friends Patient language and need for interpreter reviewed:: No Do you feel safe going back to the place where you live?: Yes      Need for Family Participation in Patient Care: Yes (Comment) Care giver support system in place?: Yes (comment) Current home services: DME, Home PT, Home OT, Homehealth aide, Home RN Criminal Activity/Legal Involvement Pertinent to Current Situation/Hospitalization: No - Comment as needed  Activities of Daily Living      Permission Sought/Granted Permission sought to share information with : Family Supports Permission granted to share information with : Yes, Verbal Permission Granted  Share Information with NAME: Ophelia Charter     Permission granted to share info w Relationship: POA/Roommates     Emotional Assessment   Attitude/Demeanor/Rapport: Unable to Assess Affect (typically observed): Unable to Assess Orientation: : Oriented to Self, Oriented to Place Alcohol / Substance Use: Not Applicable Psych Involvement: No (comment)  Admission diagnosis:  [facility]) [R57.9] Patient Active Problem List   Diagnosis Date Noted   DNR (do not resuscitate) discussion 03/03/2023   Bacteremia due to Klebsiella pneumoniae 03/03/2023   Pressure injury of skin 03/03/2023   Metastatic adenocarcinoma to liver (HCC) 03/01/2023   Septic shock (HCC) 02/28/2023   Metabolic acidosis 12/29/2022   Sepsis (HCC) 12/27/2022   Rash 10/30/2022   Hypotension 10/30/2022   Opioid use  08/16/2022   Protein-calorie malnutrition, severe 08/08/2022   Ventricular tachycardia (HCC) 08/04/2022   Dyspnea 06/15/2022   Ventricular bigeminy 04/30/2022   Multiple closed facial bone fractures, initial encounter (HCC) 04/30/2022   CAD s/p CABG 2020, STEMI 01/2022 04/30/2022   Demand ischemia of myocardium    NSTEMI (non-ST elevated myocardial infarction) (HCC)  04/09/2022   Esophageal cancer, stage IV (HCC) 03/22/2022   Acute metabolic encephalopathy 03/11/2022   Stroke (HCC) 02/28/2022   Adenocarcinoma of unknown origin (HCC) 02/22/2022   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), left (HCC) 01/31/2022   Cardiac arrest (HCC) 01/31/2022   Anoxic brain injury (HCC) 01/31/2022   Thrombocytopenia (HCC) 01/31/2022   Pulmonary emboli (HCC) 01/31/2022   Aspiration pneumonia (HCC) 01/31/2022   PAD (peripheral artery disease) (HCC)    Hypokalemia 05/30/2018   S/P lumbar fusion, Lower back pain 01/03/2018   Coronary artery disease    AKI (acute kidney injury) (HCC) 12/09/2015   Tobacco use disorder 08/20/2012   Normocytic anemia 08/06/2012   Pneumonia due to infectious organism 11/07/2011   HTN (hypertension) 04/20/2011   Chronic pain syndrome 02/22/2010   HLD (hyperlipidemia) 10/08/2009   Rheumatoid arthritis (HCC) 08/11/2009   PCP:  Rocky Morel, DO Pharmacy:   [facility] - [place], [place] - [address] [place] [address] Phone: [phone] Fax: [phone]     Social Determinants of Health (SDOH) Social History: SDOH Screenings   Food Insecurity: No Food Insecurity (11/10/2022)  Housing: Low Risk  (11/10/2022)  Transportation Needs: Unmet Transportation Needs (11/10/2022)  Utilities: Not At Risk (11/10/2022)  Alcohol Screen: Low Risk  (11/10/2022)  Depression (PHQ2-9): Medium Risk (11/10/2022)  Financial Resource Strain: Low Risk  (11/10/2022)  Physical Activity: Insufficiently Active (11/10/2022)  Social Connections: Moderately Isolated (11/10/2022)  Stress: No Stress Concern Present (11/10/2022)  Tobacco Use: Medium Risk (03/02/2023)   SDOH Interventions:     Readmission Risk Interventions    01/01/2023    4:52 PM 08/09/2022   11:59 AM 05/02/2022    4:00 PM  Readmission Risk Prevention Plan  Transportation Screening Complete Complete Complete  Medication Review [employer])  Complete Complete Complete  HRI or Home Care Consult Complete Complete Complete  SW Recovery Care/Counseling Consult Complete Complete Complete  Palliative Care Screening Not Applicable Not Applicable Not Applicable  Skilled Nursing Facility Patient Refused Not Applicable Complete

## 2023-03-05 DIAGNOSIS — R6521 Severe sepsis with septic shock: Secondary | ICD-10-CM | POA: Diagnosis not present

## 2023-03-05 DIAGNOSIS — A419 Sepsis, unspecified organism: Secondary | ICD-10-CM | POA: Diagnosis not present

## 2023-03-05 LAB — BPAM RBC
Blood Product Expiration Date: 202409262359
Blood Product Expiration Date: 202409262359
ISSUE DATE / TIME: 202409081054
ISSUE DATE / TIME: 202409081104
Unit Type and Rh: 5100
Unit Type and Rh: 5100

## 2023-03-05 LAB — CULTURE, BLOOD (ROUTINE X 2): Culture: NO GROWTH

## 2023-03-05 LAB — TYPE AND SCREEN
ABO/RH(D): O POS
Antibody Screen: POSITIVE
Donor AG Type: NEGATIVE
Donor AG Type: NEGATIVE
Unit division: 0
Unit division: 0

## 2023-03-05 LAB — GLUCOSE, CAPILLARY
Glucose-Capillary: 70 mg/dL (ref 70–99)
Glucose-Capillary: 125 mg/dL — ABNORMAL HIGH (ref 70–99)
Glucose-Capillary: 77 mg/dL (ref 70–99)
Glucose-Capillary: 70 mg/dL (ref 70–99)
Glucose-Capillary: 92 mg/dL (ref 70–99)
Glucose-Capillary: 89 mg/dL (ref 70–99)

## 2023-03-05 LAB — COMPREHENSIVE METABOLIC PANEL
ALT: 33 U/L (ref 0–44)
AST: 39 U/L (ref 15–41)
Albumin: 1.7 g/dL — ABNORMAL LOW (ref 3.5–5.0)
Alkaline Phosphatase: 363 U/L — ABNORMAL HIGH (ref 38–126)
Anion gap: 6 (ref 5–15)
BUN: 13 mg/dL (ref 8–23)
CO2: 25 mmol/L (ref 22–32)
Calcium: 7.7 mg/dL — ABNORMAL LOW (ref 8.9–10.3)
Chloride: 103 mmol/L (ref 98–111)
Creatinine, Ser: 0.71 mg/dL (ref 0.61–1.24)
GFR, Estimated: >60 mL/min (ref >60)
Glucose, Bld: 98 mg/dL (ref 70–99)
Potassium: 3.9 mmol/L (ref 3.5–5.1)
Sodium: 134 mmol/L — ABNORMAL LOW (ref 135–145)
Total Bilirubin: 0.5 mg/dL (ref 0.3–1.2)
Total Protein: 5.2 g/dL — ABNORMAL LOW (ref 6.5–8.1)

## 2023-03-05 LAB — PHOSPHORUS: Phosphorus: 2.8 mg/dL (ref 2.5–4.6)

## 2023-03-05 LAB — MAGNESIUM: Magnesium: 1.6 mg/dL — ABNORMAL LOW (ref 1.7–2.4)

## 2023-03-05 MED ORDER — IOHEXOL 9 MG/ML PO SOLN
500.0000 mL | ORAL | Status: AC
  Administered 2023-03-05 (×2): 500 mL via ORAL

## 2023-03-05 MED ORDER — DEXTROSE 50 % IV SOLN
12.5000 g | INTRAVENOUS | Status: AC
  Administered 2023-03-05: 12.5 g via INTRAVENOUS
  Filled 2023-03-05: qty 50

## 2023-03-05 MED ORDER — MAGNESIUM SULFATE 2 GM/50ML IV SOLN
2.0000 g | Freq: Once | INTRAVENOUS | Status: AC
  Administered 2023-03-05: 2 g via INTRAVENOUS
  Filled 2023-03-05: qty 50

## 2023-03-05 NOTE — Progress Notes (Addendum)
   03/05/23 1348  Spiritual Encounters  Type of Visit Initial  Care provided to: Patient  Conversation partners present during encounter Nurse  Referral source Chaplain assessment  Reason for visit Advance directives  OnCall Visit No   Visited with patient per request on consult. Called "[name] [name]" and "[name] [name]" to verify an advance directive had previously been completed as unable to locate in file. Per [name] [name] they believed they completed it in July of this year, however left before getting a copy. Unsure if it was notarized. Began process to complete new document however, per patient he is not wanting to complete.   Prior records were located which show that patient's"lacked clarity to complete prior AD dated 12/29/22, therefore there is not advance directive in existence.   Patient also insist that the doctors informed him that they were going to start another round of chemo.      Advance directive not processed.

## 2023-03-05 NOTE — Care Management Important Message (Signed)
Important Message  Patient Details  Name: GEOVANNIE MACNAB MRN: [id number] Date of Birth: 12/04/1960   Medicare Important Message Given:  Yes     Dorena Bodo 03/05/2023, 2:59 PM

## 2023-03-05 NOTE — Progress Notes (Signed)
HD#5 SUBJECTIVE:  Patient Summary: [name] [name] is a 62 y.o. 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 He was in the ICU From 9/4-9/8 for septic shock secondary to Klebsiella bacteremia requiring vasopressor support. Pressors were discontinued on 9/7 and he is stable to transfer to the floors today, 9/9.   Overnight Events: No acute events overnight  Interim History: The patient was seen at the bedside this morning. He is alert and oriented to self and place and is able to follow commands. He endorses some nausea but denies any other acute concerns.   OBJECTIVE:  Vital Signs: Vitals:   03/05/23 0120 03/05/23 0500 03/05/23 0519 03/05/23 0752  BP: 111/84  107/76 98/67  Pulse: 65  67 65  Resp: 17  17 16   Temp: 97.6 F (36.4 C)  97.7 F (36.5 C) 98.6 F (37 C)  TempSrc: Oral  Oral   SpO2: 94%  94% 94%  Weight:  62 kg     Supplemental O2: Room Air SpO2: 94 %  Filed Weights   03/03/23 0242 03/04/23 0500 03/05/23 0500  Weight: 60 kg 60.2 kg 62 kg     Intake/Output Summary (Last 24 hours) at 03/05/2023 0912 Last data filed at 03/04/2023 1700 Gross per 24 hour  Intake --  Output 550 ml  Net -550 ml   Net IO Since Admission: 2,916.22 mL [03/05/23 0912]  Physical Exam: General: Chronically ill appearing male laying in bed, no acute distress.  CV: RRR, distant heart sounds. No murmurs appreciated.  Pulmonary: Lungs CTAB. Normal effort. No wheezing or rales. Abdominal: Soft, nontender, nondistended. Normal bowel sounds. Extremities: S/p left AKA, no RLE edema Skin: Warm and dry.  Neuro: A&O to self, place, and year (not oriented to month). Not oriented to situation. Able to answer simple math question. No focal deficit.  Psych: Normal mood and affect   Patient Lines/Drains/Airways  Status     Active Line/Drains/Airways     Name Placement date Placement time Site Days   Implanted Port 03/07/22 Right Chest 03/07/22  1100  Chest  363   Peripheral IV 02/28/23 20 G Anterior;Left Forearm 02/28/23  1643  Forearm  5   External Urinary Catheter 02/28/23  2100  --  5   Pressure Injury 01/31/22 Buttocks Right Stage 1 -  Intact skin with non-blanchable redness of a localized area usually over a bony prominence. 01/31/22  1615  -- 398   Pressure Injury 02/28/23 Sacrum Medial Deep Tissue Pressure Injury - Purple or maroon localized area of discolored intact skin or blood-filled blister due to damage of underlying soft tissue from pressure and/or shear. DTI sacrum 02/28/23  2130  -- 5   Pressure Injury 02/28/23 Coccyx Medial Stage 2 -  Partial thickness loss of dermis presenting as a shallow open injury with a red, pink wound bed without slough. stage 2 sacrum 02/28/23  2130  -- 5   Wound / Incision (Open or Dehisced) 02/19/19 Non-pressure wound Toe (Comment  which one) Left 02/19/19  2003  Toe (Comment  which one)  1475   Wound / Incision (Open or Dehisced) 02/19/19 Non-pressure wound Leg Right;Anterior;Lower 02/19/19  2000  Leg  1475   Wound / Incision (Open or Dehisced) 02/15/22 Puncture Abdomen Right;Upper 02/15/22  1439  Abdomen  383   Wound / Incision (Open or Dehisced) 02/28/23 Heel Right DTI right heel 02/28/23  2130  Heel  5             ASSESSMENT/PLAN:  Assessment: Principal Problem:   Septic shock (HCC) Active Problems:   Metastatic adenocarcinoma to liver [facility])   DNR (do not resuscitate) discussion   Bacteremia due to Klebsiella pneumoniae   Pressure injury of skin   Plan: Septic shock, resolved Klebsiella bacteremia Admitted to the ICU from 9/4-9/8 due to septic shock requiring vasopressor support. BP remains low, but is stable off of pressors, with MAPs >65. Today is day 5 of ceftriaxone, will plan to treat for 7 days. - Continue ceftriaxone through 9/11 -  PT/OT  Chronic right sided heart failure Cardiomyopathy EF 55-60% this admission with severely reduced RV function and severely enlarged RV and dilated IVC. GDMT is limited in the setting of hypotension. The patient is euvolemic at this time.   Hx of VT/VF CAD LHC 01/2022 with small non-dominant RCA occlusion, medical therapy recommended. No chest pain at this time. - Continue plavix - Statin and amiodarone held in the setting of elevated LFTs  AKI, resolved - Daily BMP  Hypomagnesemia Mg 1.6 this morning, s/p 2 g mag sulfate. - Daily BMP, Mg  Metastatic (presumed) esophageal cancer Goals of care The patient has been treated with multiple systemic therapies including chemo, immunotherapy, and targeted therapy. Oncology recommends ongoing conversations regarding code status and hospice, as systemic treatment options are limited. Palliative care was also consulted this admission and unfortunately, the patient is not oriented to situation. He is not able to fully understand his disease burden and that he is likely not going to be offered further cancer treatment. His HCPOA caregivers, [name] and [place], do understand this. I anticipate home with home health services with CAP aid services (20 hrs/week) as per the last palliative care note on 9/7. The patient will continue to follow with outpatient palliative care and oncology, Dr. Myrle Sheng. I have attempted to call both of the patient's HCPOA, however, was unable to reach them.  - CT abd/pelvis to assess tumor burden   Pancytopenia WBC low at 3.6 yesterday, Hb 9.4, and platelets 91. Anemia and thrombocytopenia appear to be chronic, and could be acutely worsened in the setting of sepsis. No signs of bleeding. - Recheck CBC tomorrow  DVT/PE in 12/2021 On eliquis 5 mg BID.   PAD s/p left AKA At baseline the patient uses a wheelchair. PT/OT recommending SNF, however, patient expresses interest in discharging to home if possible.  Best  Practice: Diet: Dysphagia 1 IVF: Fluids: none VTE:  Eliquis Code: Full AB: Ceftriaxone Therapy Recs: SNF, DME Family Contact: Attempted to call friends, [name] [name] and [name] [name] [facility]) DISPO: Anticipated discharge in 2-3 days to  SNF/home  pending  completion of IV antibiotics .  Signature: Elza Rafter, D.O.  Internal Medicine Resident, PGY-3 [facility]  Pager: [phone] 9:12 AM, 03/05/2023   Please contact the on call pager after 5 pm and on weekends at [phone].

## 2023-03-05 NOTE — ACP (Advance Care Planning) (Signed)
Patient does not have and HCPOA. Declined signing document for [name] and [name] to serve as health Care Agents.

## 2023-03-05 NOTE — Progress Notes (Signed)
IP PROGRESS NOTE  Subjective:   [name] has no specific complaint.  He denies dysphagia.  Objective: Vital signs in last 24 hours: Blood pressure 98/67, pulse 65, temperature 98.6 F (37 C), resp. rate 16, weight 136 lb 11 oz (62 kg), SpO2 94%.  Intake/Output from previous day: 09/08 0701 - 09/09 0700 In: -  Out: 550 [Urine:550]  Physical Exam:  HEENT: No thrush Lungs: Clear anteriorly, no respiratory distress Cardiac: Regular rate and rhythm Abdomen: Nontender, no hepatosplenomegaly, no mass Extremities: No right leg edema Neurologic: Alert and oriented, follows commands  Portacath/PICC-without erythema  Lab Results: Recent Labs    03/03/23 1100 03/04/23 0353  WBC 5.5 3.6*  HGB 10.1* 9.4*  HCT 31.9* 30.3*  PLT 112* 91*    BMET Recent Labs    03/04/23 0353 03/05/23 0338  NA 137 134*  K 3.3* 3.9  CL 105 103  CO2 23 25  GLUCOSE 80 98  BUN 15 13  CREATININE 0.85 0.71  CALCIUM 7.7* 7.7*    Lab Results  Component Value Date   CEA1 287.0 (H) 02/11/2022   CEA 191.50 (H) 01/08/2023   IHK742 88 (H) 02/11/2022    Studies/Results: No results found.  Medications: I have reviewed the patient's current medications.  Assessment/Plan:  Metastatic adenocarcinoma, likely esophagus primary - Liver mass, mass or lymph node conglomerate near the GE junction, gastrohepatic ligament and retroperitoneal lymphadenopathy. -02/11/2022 CTA chest-7.8 x 8.5 cm subtle hypoattenuating mass lesion in segment IV of the liver. -02/11/2022 CT abdomen/pelvis-8.7 x 7.8 or ill-defined irregular mass in the medial segment left liver, 4.5 x 3.4 cm necrotic mass lesion just cranial to the esophagogastric junction with metastatic lymphadenopathy in the abdomen and pelvis, irregular nodular peritoneal thickening in the lower abdomen and pelvis -02/11/2022 MRI abdomen-rim-enhancing likely necrotic mass or lymph node conglomerate centered around the gastroesophageal junction measuring 5.0 x  3.9 cm, large rim-enhancing likely necrotic liver mass centered in the anterior left lobe of the liver measuring 7.8 x 7.2 cm, enlarged gastrohepatic ligament and retroperitoneal lymph nodes -Labs from 02/11/2022-AFP 2.4, CA 19.9 88, CEA 287 -Ultrasound-guided liver biopsy performed 02/15/2022-adenocarcinoma with extensive necrosis, CK7 positive, negative for CK20, CDX2, TTF-1, and PAX8, consistent with a pancreatic, lung, or upper gastrointestinal primary -PET 03/08/2022-hypermetabolic distal esophagus mass, hypermetabolic centrally necrotic hepatic mass, hypermetabolic periportal nodes, exophytic GE junction mass without hypermetabolism-likely benign lesion, no hypermetabolism in the pancreas head, bilateral hypermetabolic adrenal gland lesions, small hypermetabolic left parotid gland lesion.  No loss of mismatch repair protein expression, HER2 3+ -CTs 03/11/2022-no change from PET 03/08/2022, known distal esophagus mass-not visualized on unenhanced CT, large hepatic metastasis and upper abdominal nodal metastases, chronic pancreatitis with 4.3 cm pseudocyst in the pancreas head -Cycle 1 FOLFOX, trastuzumab, Pembrolizumab 04/06/2022 -Cycle 2 oxaliplatin, 5-FU bolus/pump and trastuzumab held due to hospitalization following cycle 1 with possible coronary vasospasm -Pembrolizumab 04/25/2022 -CT abdomen/pelvis 05/02/2022-increased peripancreatic edema, increased size of pancreas head pseudocyst, increased necrotic nodal masses at the GE junction -Cycle 3 oxaliplatin, trastuzumab, pembrolizumab 05/15/2022; 5-FU held secondary to cardiac toxicity following cycle 1 -Cycle 4 oxaliplatin, trastuzumab 05/29/2022, 5-FU held secondary to cardiac toxicity -Cycle 5 oxaliplatin, trastuzumab, Pembrolizumab (6-week dosing for Pembrolizumab) 06/12/2022, 5-FU held secondary to cardiac toxicity -Cycle 6 oxaliplatin, trastuzumab 06/28/2022, 5-FU held -Cycle 7 oxaliplatin, trastuzumab 07/17/2022, 5-FU held -CTs 07/19/2022-decrease  in the dominant anterior liver mass, 2 new small liver lesions compared to 05/02/2022, new right lower lobe nodule, enlargement of epicardial lymph nodes, slight decrease in size of  celiac axis, gastropathic ligament, and portacaval lymphadenopathy, unchanged esophageal thickening -Cycle 8 oxaliplatin and pembrolizumab 08/14/2022, 5-FU and trastuzumab held secondary to potential cardiac toxicity -CTs 09/12/2022-increased number hepatic metastases, few are larger; multiple abnormal lymph nodes identified in the abdomen and lower mediastinum, relatively similar; increased stranding along the mesentery and retroperitoneum; persistent wall thickening of the duodenum with slight ectasia of the lumen; persistent left hepatic lobe biliary duct dilatation, likely compressive from adjacent metastatic lesions; previous 6 mm right lower lobe lung nodule obscured by motion. -Cycle 1 Taxol/cyramza 09/20/2022 -Cycle 2 Taxol/Cyramza 10/04/2022 -Cycle 3 Taxol/Cyramza 10/19/2022 (Taxol dose reduced secondary to thrombocytopenia) -Cycle 4 Taxol/cyramza 11/16/2022 -Cycle 5 Taxol/Cyramza 11/30/2022 -CT chest emergency department 12/17/2022 due to complaint of dyspnea-no enlarged mediastinal, hilar or axillary lymph nodes.  New 7 mm lung nodule seen within the superior segment right lower lobe.  Additional 9 mm posterior right lower lobe lung nodule, previously measured 5 mm on prior study.  Small bilateral pleural effusions.  Multiple liver masses of various sizes, present on the prior exam.  4.6 x 3.7 low-attenuation extending from the posterior aspect of the left lobe of the liver to the region of the gastric fundus, previously measured 4.8 x 3.9 cm -Cycle 6 Taxol/Cyramza 01/08/2023 -CT chest 02/28/2023-slightly less conspicuous right upper lobe peripheral groundglass opacities, likely increase in size of multiple poorly defined hepatic masses, increase in size of a gastroesophageal/gastric fundus mass   2.  Myocardial  infarction/cardiac arrest 01/31/2022 -Echocardiogram 02/01/2022-LVEF 60-65%, right ventricle severely dilated with hypokinesis in the basal and midportion, hypokinesis in the apical portion, right ventricular systolic function moderately reduced -Echocardiogram on 05/01/2022, LVEF 50-55%, decreased right ventricular systolic function, moderately enlarged right ventricle -Echocardiogram 08/05/2022-LVEF 45-50%, mildly decreased left ventricular function, moderate reduced right ventricular function 3.  Microcytic anemia -Labs from 02/11/2022-ferritin 151, iron 13, percent saturation 5% 4.  DVT/pulmonary embolism-bilateral lower extremity DVTs 01/10/2022, pulmonary emboli on chest CT 01/31/2022-apixaban 5.  PAD status post AKA in 2021 6.  RA 7.  Bilateral pleural effusions -Ultrasound-guided thoracentesis performed on 02/11/2022.  Cytology negative for malignancy. 8.  COPD 9.  Admission 03/11/2022 with E. coli bacteremia Ultrasound-guided biopsy/aspiration of dominant necrotic appearing liver lesion 03/17/2022-scant fluid aspirated-negative culture, no organisms or white cells.  Pathology with necrotic tissue. 10.  Admission 04/29/2022 after a fall, multiple skull fractures 11.  Fever, hypotension-Klebsiella bacteremia on blood culture 04/30/2022 12.  Admission 04/09/2022 with chest pain, NSTEMI 13.  Admission 08/04/2022 with ventricular tachycardia, status post cardioversion, noted to have hypokalemia and hypomagnesemia, started on amiodarone 14.  Rash following cycle 3 Taxol/ramucirumab-admitted to [facility] due to concern for SJS.  SJS and disseminated zoster ruled out. 15.  Admission 12/27/2022 with sepsis syndrome, probable aspiration pneumonia 16.  Admission 02/28/2023 with sepsis syndrome, blood culture positive for Klebsiella    [name] [name] has metastatic adenocarcinoma, likely of gastroesophageal origin.  He has been treated with multiple systemic therapy agents including chemotherapy, immunotherapy, and targeted  therapy.  Systemic treatment options are limited, but we could consider salvage systemic therapy with deruxtecan and other HER2 directed therapies.  I reviewed the admission chest CT images.  Is difficult to accurately measure the liver lesion and apparent gastroesophageal mass without contrast.  The previously noted lung lesion is not seen.  The CEA was stable in July.  He appears to have indolent metastatic adenocarcinoma.  The tumor burden is most likely slowly progressing.  [name] [name] has multiple comorbid conditions including coronary artery disease, heart failure, and COPD.  He been admitted multiple times over the past year.  He is now admitted with Klebsiella sepsis.  The source for infection is most likely related to the abdominal tumor burden.  There is no evidence for a Port-A-Cath infection, pneumonia, or a urinary source.  The renal function is now normal.  I recommend proceeding with a restaging CT abdomen/pelvis to better assess the tumor burden.  This will aid in discussions regarding the prognosis and treatment options.  [name] [name] indicated again today that he would like to see further systemic therapy.  He remains on a full CODE STATUS.   Recommendations: Antibiotics, management of sepsis syndrome per the critical care service Continue discussions regarding CODE STATUS and hospice care CT abdomen/pelvis Outpatient follow-up will be scheduled at the Cancer center    LOS: 5 days   Thornton Papas, MD   03/05/2023, 8:06 AM

## 2023-03-05 NOTE — Progress Notes (Signed)
   03/05/23 1636  Spiritual Encounters  Type of Visit Initial  Care provided to: Patient  Referral source Chaplain team  Reason for visit Routine spiritual support  OnCall Visit No   Chaplain went to briefly visit Pt, as a follow-up of a previous conversation of Pt and [employer]. Mirna Mires was able to quickly talk with Pt, who was grateful to know the Chaplain's team is caring for his needs, particularly the need to see a Chaplain from his faith denomination [employer] optician). Chap let Pt know that the team will continue coming to visit him and provide spiritual support for him. Pt shared he was tired, but that he would appreciate another visit soon. Chap exited the room.

## 2023-03-05 NOTE — Progress Notes (Signed)
Occupational Therapy Treatment Patient Details Name: [name] [name] MRN: [id number] DOB: 06-Feb-1961 Today's Date: 03/05/2023   History of present illness 62 y.o male presented on 9/4 via EMS 2/2 AMS and fall, noted to be hypotensive and bradycardic. Admitted to ICU for pressor requirement, did not have respiratory distress. Found to have Klebsiella bacteremia and currently on IV antibiotics. PMH: metastatic adenocarcinoma (currently on chemo), multiple DVT/PE on AC, HTN, HLD, CAD s/p CABG 2020, many recent admissions   OT comments  Pt is making fair progress towards their acute OT goals.  Pt seen with PT to address OOB. He continues to need mod-max A +2 for all aspects of bed mobility and for slideboard transfer from EOB>chair. He did demonstrate improved sitting balance during grooming tasks at the EOB with good tolerance. OT to continue to follow acutely to facilitate progress towards established goals. Pt will continue to benefit from skilled inpatient follow up therapy, <3 hours/day - however, pt with plans to d/c home with roommates; he will need a hoyer lift to safely transition to the home environment.        If plan is discharge home, recommend the following:  A lot of help with walking and/or transfers;A lot of help with bathing/dressing/bathroom;Assistance with cooking/housework;Direct supervision/assist for medications management;Direct supervision/assist for financial management;Assist for transportation;Help with stairs or ramp for entrance   Equipment Recommendations  Hoyer lift       Precautions / Restrictions Precautions Precautions: Fall Precaution Comments: L AKA Restrictions Weight Bearing Restrictions: No       Mobility Bed Mobility Overal bed mobility: Needs Assistance Bed Mobility: Rolling, Sidelying to Sit Rolling: Mod assist Sidelying to sit: Max assist, +2 for physical assistance, +2 for safety/equipment       General bed mobility comments: with step by  step cues    Transfers Overall transfer level: Needs assistance Equipment used: Rolling walker (2 wheels) Transfers: Bed to chair/wheelchair/BSC            Lateral/Scoot Transfers: With slide board, Max assist, +2 physical assistance, +2 safety/equipment       Balance Overall balance assessment: Needs assistance Sitting-balance support: Feet supported Sitting balance-Leahy Scale: Fair             ADL either performed or assessed with clinical judgement   ADL Overall ADL's : Needs assistance/impaired     Grooming: Set up;Sitting Grooming Details (indicate cue type and reason): improved sitting balance                 Toilet Transfer: Maximal assistance;+2 for physical assistance;+2 for safety/equipment Toilet Transfer Details (indicate cue type and reason): simulated. slideboard>drop arm recliner         Functional mobility during ADLs: Maximal assistance;+2 for physical assistance;+2 for safety/equipment General ADL Comments: improved balance and activity tolerance; continues to need max A for mobility    Extremity/Trunk Assessment Upper Extremity Assessment Upper Extremity Assessment: RUE deficits/detail;LUE deficits/detail RUE Deficits / Details: severe RA, minimal movement of digits, unable to get to full elbow extension. limited shoulder ROM RUE Coordination: decreased fine motor;decreased gross motor LUE Deficits / Details: severe RA, minimal movement of digits, unable to get to full elbow extension. limited shoulder ROM LUE Coordination: decreased gross motor;decreased fine motor   Lower Extremity Assessment Lower Extremity Assessment: Defer to PT evaluation        Vision   Vision Assessment?: No apparent visual deficits   Perception Perception Perception: Not tested   Praxis Praxis Praxis: Not tested  Cognition Arousal: Alert Behavior During Therapy: Flat affect Overall Cognitive Status: No family/caregiver present to determine  baseline cognitive functioning               General Comments: continues with flat affect, questionable historian              General Comments VSS, pt askign for pain medication at the end of his session    Pertinent Vitals/ Pain       Pain Assessment Pain Assessment: Faces Faces Pain Scale: Hurts little more Pain Location: buttocks Pain Descriptors / Indicators: Grimacing, Guarding, Discomfort Pain Intervention(s): Limited activity within patient's tolerance, Monitored during session   Frequency  Min 1X/week        Progress Toward Goals  OT Goals(current goals can now be found in the care plan section)  Progress towards OT goals: Progressing toward goals  Acute Rehab OT Goals Patient Stated Goal: to go home OT Goal Formulation: With patient Time For Goal Achievement: 03/17/23 Potential to Achieve Goals: Good ADL Goals Pt Will Perform Grooming: with set-up;sitting Pt Will Perform Upper Body Dressing: with set-up;sitting Pt Will Perform Lower Body Dressing: with mod assist;sit to/from stand Pt Will Transfer to Toilet: with min assist;stand pivot transfer Additional ADL Goal #1: pt will indep complete bed mobility with mod I as a precursor to ADLs  Plan      Co-evaluation    PT/OT/SLP Co-Evaluation/Treatment: Yes Reason for Co-Treatment: Complexity of the patient's impairments (multi-system involvement);For patient/therapist safety;To address functional/ADL transfers   OT goals addressed during session: ADL's and self-care      AM-PAC OT "6 Clicks" Daily Activity     Outcome Measure   Help from another person eating meals?: A Little Help from another person taking care of personal grooming?: A Little Help from another person toileting, which includes using toliet, bedpan, or urinal?: A Lot Help from another person bathing (including washing, rinsing, drying)?: A Lot Help from another person to put on and taking off regular upper body clothing?: A  Lot Help from another person to put on and taking off regular lower body clothing?: Total 6 Click Score: 13    End of Session    OT Visit Diagnosis: Unsteadiness on feet (R26.81);Other abnormalities of gait and mobility (R26.89);Muscle weakness (generalized) (M62.81)   Activity Tolerance Patient tolerated treatment well   Patient Left in chair;with call bell/phone within reach   Nurse Communication Mobility status        Time: 3086-5784 OT Time Calculation (min): 26 min  Charges: OT General Charges $OT Visit: 1 Visit OT Treatments $Self Care/Home Management : 8-22 mins  Derenda Mis, OTR/L Acute Rehabilitation Services Office [phone] Secure Chat Communication Preferred   Donia Pounds 03/05/2023, 1:13 PM

## 2023-03-05 NOTE — Progress Notes (Signed)
Physical Therapy Treatment Patient Details Name: [name] [name] MRN: [id number] DOB: 1961/02/12 Today's Date: 03/05/2023   History of Present Illness 62 y.o male presented on 9/4 via EMS 2/2 AMS and fall, noted to be hypotensive and bradycardic. Admitted to ICU for pressor requirement, did not have respiratory distress. Found to have Klebsiella bacteremia and currently on IV antibiotics. PMH: metastatic adenocarcinoma (currently on chemo), multiple DVT/PE on AC, HTN, HLD, CAD s/p CABG 2020, many recent admissions    PT Comments  Pt greeted resting in bed and agreeable to PT/OT session with focus on progression of functional mobility and safe functional transfers. Pt continues to require max A +2 for bed mobility and lateral scoot transfer with sliding board EOB>chair as pt with RLE and BUE weakness and decreased coordination. Pt with fair tolerance for LE therex with cues for technique and hands on assist for increased ROM. Pt agreeable to time up in chair at end of session and verbalizing understanding of education on importance of time up OOB. Pt continues to benefit from skilled PT services to progress toward functional mobility goals.     If plan is discharge home, recommend the following: A lot of help with walking and/or transfers;A lot of help with bathing/dressing/bathroom   Can travel by private vehicle     No  Equipment Recommendations  None recommended by PT    Recommendations for Other Services       Precautions / Restrictions Precautions Precautions: Fall Precaution Comments: L AKA Restrictions Weight Bearing Restrictions: No     Mobility  Bed Mobility Overal bed mobility: Needs Assistance Bed Mobility: Rolling, Sidelying to Sit Rolling: Mod assist Sidelying to sit: Max assist, +2 for physical assistance, +2 for safety/equipment       General bed mobility comments: with step by step cues    Transfers Overall transfer level: Needs assistance Equipment used:  Rolling walker (2 wheels) Transfers: Bed to chair/wheelchair/BSC, Sit to/from Stand Sit to Stand: Max assist          Lateral/Scoot Transfers: With slide board, Max assist, +2 physical assistance, +2 safety/equipment General transfer comment: pt with poor initiation and decreased strength, needing max A to complete trasnfer, max A with face-face technique to come to partial stand from chair x2    Ambulation/Gait               General Gait Details: unable at baseline   Stairs             Wheelchair Mobility     Tilt Bed    Modified Rankin (Stroke Patients Only)       Balance Overall balance assessment: Needs assistance Sitting-balance support: Feet supported Sitting balance-Leahy Scale: Fair                                      Cognition Arousal: Alert Behavior During Therapy: Flat affect Overall Cognitive Status: No family/caregiver present to determine baseline cognitive functioning                                 General Comments: continues with flat affect, questionable historian        Exercises General Exercises - Lower Extremity Long Arc Quad: AROM, Right, 10 reps, Seated Hip Flexion/Marching: AROM, Right, 5 reps, Seated    General Comments General comments (skin integrity, edema, etc.): VSS,  pt askign for pain medication at the end of his session      Pertinent Vitals/Pain Pain Assessment Pain Assessment: Faces Faces Pain Scale: Hurts little more Pain Location: buttocks, RLE Pain Descriptors / Indicators: Grimacing, Guarding, Discomfort, Cramping Pain Intervention(s): Monitored during session, Limited activity within patient's tolerance    Home Living                          Prior Function            PT Goals (current goals can now be found in the care plan section) Acute Rehab PT Goals PT Goal Formulation: With patient Time For Goal Achievement: 03/17/23 Progress towards PT goals:  Progressing toward goals    Frequency    Min 1X/week      PT Plan      Co-evaluation PT/OT/SLP Co-Evaluation/Treatment: Yes Reason for Co-Treatment: Complexity of the patient's impairments (multi-system involvement);For patient/therapist safety;To address functional/ADL transfers PT goals addressed during session: Mobility/safety with mobility;Proper use of DME;Strengthening/ROM OT goals addressed during session: ADL's and self-care      AM-PAC PT "6 Clicks" Mobility   Outcome Measure  Help needed turning from your back to your side while in a flat bed without using bedrails?: A Lot Help needed moving from lying on your back to sitting on the side of a flat bed without using bedrails?: A Lot Help needed moving to and from a bed to a chair (including a wheelchair)?: A Lot Help needed standing up from a chair using your arms (e.g., wheelchair or bedside chair)?: Total Help needed to walk in hospital room?: Total Help needed climbing 3-5 steps with a railing? : Total 6 Click Score: 9    End of Session   Activity Tolerance: Patient limited by fatigue Patient left: in chair;with call bell/phone within reach Nurse Communication: Mobility status;Patient requests pain meds PT Visit Diagnosis: Other abnormalities of gait and mobility (R26.89);Muscle weakness (generalized) (M62.81);History of falling (Z91.81)     Time: 1610-9604 PT Time Calculation (min) (ACUTE ONLY): 26 min  Charges:    $Therapeutic Activity: 8-22 mins PT General Charges $$ ACUTE PT VISIT: 1 Visit                     Merikay Lesniewski R. PTA Acute Rehabilitation Services Office: [phone]   Catalina Antigua 03/05/2023, 2:10 PM

## 2023-03-06 ENCOUNTER — Inpatient Hospital Stay (HOSPITAL_COMMUNITY): Payer: 59

## 2023-03-06 ENCOUNTER — Inpatient Hospital Stay

## 2023-03-06 DIAGNOSIS — Z7189 Other specified counseling: Secondary | ICD-10-CM | POA: Diagnosis not present

## 2023-03-06 DIAGNOSIS — D378 Neoplasm of uncertain behavior of other specified digestive organs: Secondary | ICD-10-CM

## 2023-03-06 DIAGNOSIS — I50812 Chronic right heart failure: Secondary | ICD-10-CM

## 2023-03-06 DIAGNOSIS — B961 Klebsiella pneumoniae [K. pneumoniae] as the cause of diseases classified elsewhere: Secondary | ICD-10-CM | POA: Diagnosis not present

## 2023-03-06 DIAGNOSIS — D61818 Other pancytopenia: Secondary | ICD-10-CM

## 2023-03-06 DIAGNOSIS — I429 Cardiomyopathy, unspecified: Secondary | ICD-10-CM

## 2023-03-06 DIAGNOSIS — Z515 Encounter for palliative care: Secondary | ICD-10-CM | POA: Diagnosis not present

## 2023-03-06 DIAGNOSIS — R6521 Severe sepsis with septic shock: Secondary | ICD-10-CM | POA: Diagnosis not present

## 2023-03-06 DIAGNOSIS — A419 Sepsis, unspecified organism: Secondary | ICD-10-CM | POA: Diagnosis not present

## 2023-03-06 LAB — GLUCOSE, CAPILLARY
Glucose-Capillary: 66 mg/dL — ABNORMAL LOW (ref 70–99)
Glucose-Capillary: 103 mg/dL — ABNORMAL HIGH (ref 70–99)
Glucose-Capillary: 82 mg/dL (ref 70–99)

## 2023-03-06 LAB — CBC
HCT: 31.2 % — ABNORMAL LOW (ref 39.0–52.0)
Hemoglobin: 9.8 g/dL — ABNORMAL LOW (ref 13.0–17.0)
MCH: 27.5 pg (ref 26.0–34.0)
MCHC: 31.4 g/dL (ref 30.0–36.0)
MCV: 87.6 fL (ref 80.0–100.0)
Platelets: 108 10*3/uL — ABNORMAL LOW (ref 150–400)
RBC: 3.56 MIL/uL — ABNORMAL LOW (ref 4.22–5.81)
RDW: 17.2 % — ABNORMAL HIGH (ref 11.5–15.5)
WBC: 4 10*3/uL (ref 4.0–10.5)
nRBC: 0 % (ref 0.0–0.2)

## 2023-03-06 LAB — MAGNESIUM: Magnesium: 2 mg/dL (ref 1.7–2.4)

## 2023-03-06 LAB — BASIC METABOLIC PANEL
Anion gap: 6 (ref 5–15)
BUN: 11 mg/dL (ref 8–23)
CO2: 24 mmol/L (ref 22–32)
Calcium: 7.7 mg/dL — ABNORMAL LOW (ref 8.9–10.3)
Chloride: 103 mmol/L (ref 98–111)
Creatinine, Ser: 0.6 mg/dL — ABNORMAL LOW (ref 0.61–1.24)
GFR, Estimated: >60 mL/min (ref >60)
Glucose, Bld: 76 mg/dL (ref 70–99)
Potassium: 3.8 mmol/L (ref 3.5–5.1)
Sodium: 133 mmol/L — ABNORMAL LOW (ref 135–145)

## 2023-03-06 MED ORDER — IOHEXOL 350 MG/ML SOLN
75.0000 mL | Freq: Once | INTRAVENOUS | Status: AC | PRN
  Administered 2023-03-06: 75 mL via INTRAVENOUS

## 2023-03-06 NOTE — Progress Notes (Signed)
Transition of Care [facility]) - Inpatient Brief Assessment   Patient Details  Name: [name] [name] MRN: [id number] Date of Birth: [birth year]  Transition of Care [facility]) CM/SW Contact:    Janae Bridgeman, RN Phone Number: 03/06/2023, 2:37 PM   Clinical Narrative: Patient admitted for Septicemia.  The patient lives at home with two friends, Patton Salles and [name] that provide care at the home.  The patient plans to return home with home health services.  I spoke with the patient at the bedside to discuss TOC needs and the patient is currently active with [facility] to provide home health services.  Orders placed for home health RN, PT, OT, aide and MSW was added to be co-signed by the attending MD.  The patient needs a Hoyer lift at home.  I called [employer] to have a HOyer delivered to the home.  [employer] will call family to arrange deliver.  DME order placed to be co-signed.  The patient's friend, [name] plans to take patient home in a car when medically stable.  I offered Medicare choice to the patient regarding OUtpatient palliative care services and the patient chose [facility].  I placed a referral with [facility] for OUtpatient palliative care.  The patient's friend, [name], states that [facility] services will start when patient returns home to provide personal care at the home 5 x per week.  No other TOC needs at this time.   Transition of Care Asessment: Insurance and Status: (P) Insurance coverage has been reviewed Patient has primary care physician: (P) Yes Home environment has been reviewed: (P) Lives at home with 2 friends, Patton Salles and [employer] Prior level of function:: (P) Home with home health services Prior/Current Home Services: (P) Current home services (Active with [facility] - [facility] Palliative referral placed.) Social Determinants of Health Reivew: (P) SDOH reviewed interventions complete Readmission risk has been reviewed: (P) Yes Transition of  care needs: (P) transition of care needs identified, TOC will continue to follow

## 2023-03-06 NOTE — Consult Note (Signed)
   [facility]  [facility] Inpatient Consult   03/06/2023  GANESH NEALS [birth year] [id number]  [facility] [[facility]]  Accountable Care Organization [ACO] Patient: [facility] [Dual]   The patient was screened for  day readmission hospitalization with noted extreme risk score for unplanned readmission risk with 2 IP/2 ED in 6 months.  The patient was assessed for potential [facility]) Care Management service needs for post hospital transition for care coordination. Review of patient's electronic medical record reveals patient is admitted with septicemia. Patient is for home with home health and to start with personal care services  Patient is to return home with friends, HH, and PCS.   Met with patient at the bedside and no one at the bedside, met with inpatient [facility] RN as patient will have the above follow up.  Plan: [facility] Liaison will continue to follow progress and disposition to asess for post hospital community care coordination/management needs.  Referral request for community care coordination: anticipate [facility]) Transitions of Care Team follow up.   [facility] Care Management/Population Health does not replace or interfere with any arrangements made by the Inpatient Transition of Care team.   For questions contact:   Charlesetta Shanks, RN, BSN, CCM [place]  [facility], [facility] Liaison Direct Dial: [phone] or secure chat Website: [email or web address]

## 2023-03-06 NOTE — Progress Notes (Signed)
Palliative Medicine Inpatient Follow Up Note HPI: [name] [name] is a 62 y.o. 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 He was in the ICU From 9/4-9/8 for septic shock secondary to Klebsiella bacteremia requiring vasopressor support. Pressors were discontinued on 9/7 and he is stable to transfer to the floors today, 9/9.   The Palliative care team has been involved to support additional goals of care conversations.   Today's Discussion 03/06/2023  *Please note that this is a verbal dictation therefore any spelling or grammatical errors are due to the "Dragon Medical One" system interpretation.  Chart reviewed inclusive of vital signs, progress notes, laboratory results, and diagnostic images.   I met with [name] [name]' nurse this morning. She shares that [name] [name] is resting comfortably in NAD. He shares with me that he is anxious to be discharged home. I shared that this is up to the primary team.   Created space and opportunity for patient to explore thoughts feelings and fears regarding current medical situation. He seems to understand some of his clinical situation though seems un-accepting of hospice care or DNR at this time.  I spoke to patients caregiver, [name] [name] who states that they feel hospice is the best thing though in our conversation there seemed to be a misunderstanding as to what hospice is exactly. I described hospice as a service for patients who have a life expectancy of 6 months or less. The goal of hospice is the preservation of dignity and quality at the end phases of life. Under hospice care, the focus changes from curative to symptom relief. [name] [name] shares that she is hoping to continue care with [facility] and OP Palliative support. She expresses that she wants to honor [name] [name]' desire to continue  aggressive measures at this time to "try". I tried to emphasize that he may experience more burdens with aggressive care and especially with resuscitative efforts. We reviewed that [name] [name] is as of now appropriate for hospice care. I shared it would be meaningful for him to speak to Dr. Truett Perna again after discharge and this may support additional decisions.   [name] [name] asks about advanced directives that were done on the last admission. I shared that I would coordinate with the chaplain to find out where this may have gone.   Questions and concerns addressed/Palliative Support Provided.   Objective Assessment: Vital Signs Vitals:   03/06/23 0423 03/06/23 0812  BP: 118/76 114/82  Pulse: 67 68  Resp: 16 18  Temp: 97.8 F (36.6 C)   SpO2: 95% 96%    Intake/Output Summary (Last 24 hours) at 03/06/2023 1207 Last data filed at 03/06/2023 0441 Gross per 24 hour  Intake --  Output 1300 ml  Net -1300 ml   Last Weight  Most recent update: 03/06/2023  6:08 AM    Weight  60.4 kg (133 lb 2.5 oz)            Gen:  Elderly Caucasian M in NAD HEENT: moist mucous membranes CV: Regular rate and rhythm  PULM:  On  RA breathing is even and nonlabored ABD: soft/nontender  EXT: LLE AKA Neuro: Alert and oriented x3  SUMMARY OF RECOMMENDATIONS   FULL CODE --> strongly recommended reviewing completion of the MOST form as well as consideration of DNAR/DNI given underlying disease burden  Discussed hospice care and philosophy - at this time patient desire continued agressive measures both patient and caregiver doesn't seem to totally understand hospice. Provided education  Appreciate Chaplain support for AD's - [name] [name] feels these had been done prior but does not know where they are. I shared we would look into this   Ongoing palliative care support  Billing based on MDM: High ______________________________________________________________________________________ [name] [name] [facility]  Palliative Medicine Team Team Cell Phone: [phone] Please utilize secure chat with additional questions, if there is no response within 30 minutes please call the above phone number  Palliative Medicine Team providers are available by phone from 7am to 7pm daily and can be reached through the team cell phone.  Should this patient require assistance outside of these hours, please call the patient's attending physician.

## 2023-03-06 NOTE — Progress Notes (Signed)
Subjective Patient is doing okay considering and was upset but (seemingly) understanding of the CT findings in terms of tumor burden.   Physical exam Blood pressure 130/87, pulse 72, temperature 97.9 F (36.6 C), resp. rate 18, weight 60.4 kg, SpO2 96%.  Physical Exam: Constitutional: chronically ill-appearing, lying in bed, in no acute distress Cardiovascular: regular rate and rhythm, no m/r/g Pulmonary/Chest: normal work of breathing on room air, lungs clear to auscultation bilaterally Abdominal: soft, non-tender, non-distended Neurological: alert & oriented x 4   Weight change: -1.6 kg   Intake/Output Summary (Last 24 hours) at 03/06/2023 1607 Last data filed at 03/06/2023 1500 Gross per 24 hour  Intake --  Output 2000 ml  Net -2000 ml   Net IO Since Admission: 916.22 mL [03/06/23 1607]  Labs, images, and other studies    Latest Ref Rng & Units 03/06/2023    3:03 AM 03/04/2023    3:53 AM 03/03/2023   11:00 AM  CBC  WBC 4.0 - 10.5 K/uL 4.0  3.6  5.5   Hemoglobin 13.0 - 17.0 g/dL 9.8  9.4  78.2   Hematocrit 39.0 - 52.0 % 31.2  30.3  31.9   Platelets 150 - 400 K/uL 108  91  112        Latest Ref Rng & Units 03/06/2023    3:03 AM 03/05/2023    3:38 AM 03/04/2023    3:53 AM  BMP  Glucose 70 - 99 mg/dL 76  98  80   BUN 8 - 23 mg/dL 11  13  15    Creatinine 0.61 - 1.24 mg/dL 9.56  2.13  0.86   Sodium 135 - 145 mmol/L 133  134  137   Potassium 3.5 - 5.1 mmol/L 3.8  3.9  3.3   Chloride 98 - 111 mmol/L 103  103  105   CO2 22 - 32 mmol/L 24  25  23    Calcium 8.9 - 10.3 mg/dL 7.7  7.7  7.7      Assessment and plan Hospital day 6  VAHIN DISCHER is a 62 y.o.pertinent PMH of stage IV esophageal cancer, CHF, DVT/PE in 12/2021 on Eliquis, PAD s/p left AKA, and COPD, who presented with a fall/altered mental status and was found to be bradycardic with shock and thus, was admitted to the ICU. He was in the ICU From 9/4-9/8 for septic shock secondary to Klebsiella  bacteremia requiring vasopressor support. Pressors were discontinued on 9/7 was stable for floor transfer 9/9.  Principal Problem:   Septic shock (HCC) Active Problems:   Metastatic adenocarcinoma to liver [facility])   DNR (do not resuscitate) discussion   Bacteremia due to Klebsiella pneumoniae   Pressure injury of skin   Goals of care, counseling/discussion  Septic shock, resolved Klebsiella bacteremia Admitted to the ICU from 9/4-9/8 due to septic shock requiring vasopressor support. BP is now stable. Today is day 6 of ceftriaxone, will plan to treat for 7 days. No leukocytosis. - Continue ceftriaxone through 9/11 - PT/OT -Trend vitals  Metastatic (presumed) esophageal cancer Goals of care The patient has been treated with multiple systemic therapies including chemo, immunotherapy, and targeted therapy. Oncology recommends ongoing conversations regarding code status and hospice, as systemic treatment options are limited. Repeat CT today showed advanced progression of metastatic disease. Discussed these findings with patient who seemingly expressed an understanding (will evaluate competence further tomorrow). Palliative saw patient today and  also spoke with patient's HCPOA, [name]. Per palliative, there still seems to be a misunderstanding among patient and [name] about the current severity and futility of treatment. -Palliative continuing to follow and appreciate recommendations.   Chronic right sided heart failure Cardiomyopathy EF 55-60% this admission with severely reduced RV function and severely enlarged RV and dilated IVC. GDMT is limited in the setting of recent hypotension. The patient is euvolemic at this time. -Consider GDMT based on plans of care (see above).    Hx of VT/VF CAD LHC 01/2022 with small non-dominant RCA occlusion, medical therapy recommended. No chest pain at this time. - Continue plavix - Statin and amiodarone held in the setting of elevated LFTs   AKI,  resolved - Daily BMP   Hypomagnesemia Mg 2.0 this morning, s/p 2 g mag sulfate.  Pancytopenia WBC wnl today at 4.0. Hb 9.8, and platelets 108. Anemia and thrombocytopenia appear to be chronic, and could be acutely worsened in the setting of sepsis. No overt signs of bleeding. - Trend CBC   DVT/PE in 12/2021 On eliquis 5 mg BID.    PAD s/p left AKA At baseline the patient uses a wheelchair. PT/OT recommending SNF, however, patient expresses interest in discharging to home if possible.    Diet: Dysphagia I VTE: Eliquis Code: Full PT/OT recommendations: SNF  Discharge plan: Pending on-going discussion regarding goals of care   Carmina Miller, DO 03/06/2023, 4:07 PM  Pager: [phone] After 5pm or weekend: [phone]

## 2023-03-06 NOTE — Progress Notes (Signed)
Physical Therapy Treatment Patient Details Name: [name] [name] MRN: [id number] DOB: 03-07-1961 Today's Date: 03/06/2023   History of Present Illness 62 y.o male presented on 9/4 via EMS 2/2 AMS and fall, noted to be hypotensive and bradycardic. Admitted to ICU for pressor requirement, did not have respiratory distress. Found to have Klebsiella bacteremia and currently on IV antibiotics. PMH: metastatic adenocarcinoma (currently on chemo), multiple DVT/PE on AC, HTN, HLD, CAD s/p CABG 2020, many recent admissions    PT Comments  Pt greeted resting in bed with c/o low back pain, however agreeable to session and endorsing improvement in pain level with time sitting up EOB and exercises. Pt continues to require max A to complete bed mobility as pt with poor UE strength and needing assist to elevate trunk from sidelying. Pt able to complete lateral scoot transfer EOB>chair with sliding board with max A to place board and complete transfer due to weakness, poor initiation and poor sequencing despite step by step cueing. Pt agreeable to time up in chair at end of session and verbalizing understanding of benefits and importance of time up OOB. Current plan remains appropriate to address deficits and maximize functional independence and decrease caregiver burden. Pt continues to benefit from skilled PT services to progress toward functional mobility goals.     If plan is discharge home, recommend the following: A lot of help with walking and/or transfers;A lot of help with bathing/dressing/bathroom   Can travel by private vehicle     No  Equipment Recommendations  None recommended by PT    Recommendations for Other Services       Precautions / Restrictions Precautions Precautions: Fall Precaution Comments: L AKA Required Braces or Orthoses:  (has prosthetic, does not wear it daily) Restrictions Weight Bearing Restrictions: No     Mobility  Bed Mobility Overal bed mobility: Needs  Assistance Bed Mobility: Rolling, Sidelying to Sit Rolling: Mod assist Sidelying to sit: Max assist       General bed mobility comments: with step by step cues    Transfers Overall transfer level: Needs assistance Equipment used: Rolling walker (2 wheels) Transfers: Bed to chair/wheelchair/BSC            Lateral/Scoot Transfers: With slide board, Max assist General transfer comment: pt with poor initiation and decreased strength, needing max A to place sliding board and to complete trasnfer, cues throughout and hand over hand assist for hand placement    Ambulation/Gait               General Gait Details: unable at baseline   Stairs             Wheelchair Mobility     Tilt Bed    Modified Rankin (Stroke Patients Only)       Balance Overall balance assessment: Needs assistance Sitting-balance support: Feet supported Sitting balance-Leahy Scale: Fair     Standing balance support: Bilateral upper extremity supported, Reliant on assistive device for balance Standing balance-Leahy Scale: Poor                              Cognition Arousal: Alert Behavior During Therapy: Flat affect Overall Cognitive Status: No family/caregiver present to determine baseline cognitive functioning                                 General Comments: continues with flat affect, questionable  historian        Exercises Other Exercises Other Exercises: seated scapular retracion x10, shoulder shrugs x10, shoulder circles x10 ea way    General Comments General comments (skin integrity, edema, etc.): VSS, pt askign for pain medication at the end of his session      Pertinent Vitals/Pain Pain Assessment Pain Assessment: Faces Faces Pain Scale: Hurts little more Pain Location: low back Pain Descriptors / Indicators: Discomfort, Sore Pain Intervention(s): Patient requesting pain meds-RN notified, Monitored during session, Limited activity  within patient's tolerance    Home Living                          Prior Function            PT Goals (current goals can now be found in the care plan section) Acute Rehab PT Goals Patient Stated Goal: go home with support of roommates PT Goal Formulation: With patient Time For Goal Achievement: 03/17/23 Progress towards PT goals: Progressing toward goals    Frequency    Min 1X/week      PT Plan      Co-evaluation              AM-PAC PT "6 Clicks" Mobility   Outcome Measure  Help needed turning from your back to your side while in a flat bed without using bedrails?: A Lot Help needed moving from lying on your back to sitting on the side of a flat bed without using bedrails?: A Lot Help needed moving to and from a bed to a chair (including a wheelchair)?: A Lot Help needed standing up from a chair using your arms (e.g., wheelchair or bedside chair)?: Total Help needed to walk in hospital room?: Total Help needed climbing 3-5 steps with a railing? : Total 6 Click Score: 9    End of Session   Activity Tolerance: Patient limited by fatigue Patient left: in chair;with call bell/phone within reach Nurse Communication: Mobility status;Patient requests pain meds PT Visit Diagnosis: Other abnormalities of gait and mobility (R26.89);Muscle weakness (generalized) (M62.81);History of falling (Z91.81)     Time: 1114-1140 PT Time Calculation (min) (ACUTE ONLY): 26 min  Charges:    $Therapeutic Activity: 23-37 mins PT General Charges $$ ACUTE PT VISIT: 1 Visit                     Merlinda Wrubel R. PTA Acute Rehabilitation Services Office: [phone]   Catalina Antigua 03/06/2023, 12:51 PM

## 2023-03-06 NOTE — Progress Notes (Signed)
IP PROGRESS NOTE  Subjective:   [name] [name] has no complaint.  He denies nausea, dysphagia, and pain.  Objective: Vital signs in last 24 hours: Blood pressure 130/87, pulse 72, temperature 97.9 F (36.6 C), resp. rate 18, weight 133 lb 2.5 oz (60.4 kg), SpO2 96%.  Intake/Output from previous day: 09/09 0701 - 09/10 0700 In: -  Out: 1300 [Urine:1300]  Physical Exam:  HEENT: No thrush  Abdomen: Nontender, no hepatosplenomegaly, no mass Extremities: No right leg edema Neurologic: Alert and oriented, follows commands  Portacath/PICC-without erythema  Lab Results: Recent Labs    03/04/23 0353 03/06/23 0303  WBC 3.6* 4.0  HGB 9.4* 9.8*  HCT 30.3* 31.2*  PLT 91* 108*    BMET Recent Labs    03/05/23 0338 03/06/23 0303  NA 134* 133*  K 3.9 3.8  CL 103 103  CO2 25 24  GLUCOSE 98 76  BUN 13 11  CREATININE 0.71 0.60*  CALCIUM 7.7* 7.7*    Lab Results  Component Value Date   CEA1 287.0 (H) 02/11/2022   CEA 191.50 (H) 01/08/2023   CAN199 88 (H) 02/11/2022    Studies/Results: CT ABDOMEN PELVIS W CONTRAST  Result Date: 03/06/2023 CLINICAL DATA:  Gastroesophageal cancer. Restaging. * Tracking Code: BO * EXAM: CT ABDOMEN AND PELVIS WITH CONTRAST TECHNIQUE: Multidetector CT imaging of the abdomen and pelvis was performed using the standard protocol following bolus administration of intravenous contrast. RADIATION DOSE REDUCTION: This exam was performed according to the departmental dose-optimization program which includes automated exposure control, adjustment of the mA and/or kV according to patient size and/or use of iterative reconstruction technique. CONTRAST:  75mL OMNIPAQUE IOHEXOL 350 MG/ML SOLN COMPARISON:  09/12/2022 FINDINGS: Lower chest: Small right and small to moderate left pleural effusions are associated with dependent collapse/consolidative opacity in both lung bases. Hepatobiliary: Bulky metastatic disease again noted in the liver. Posterior right hepatic  lobe lesion measuring 4.0 x 2.4 cm on image 17/3 was 2.5 x 2.2 cm previously (remeasured). Anterior subcapsular index lesion measured previously at 2.6 x 2.0 cm is now 3.3 x 2.2 cm on image 15/3. Pneumobilia suggests prior sphincterotomy with common bile duct dilated up to 13 mm in the head of the pancreas. Pancreas: 3.8 x 2.9 cm necrotic lesion in the tail of the pancreas is new in the interval and invades the splenic vein (axial image 32/3 and sagittal image 92/7). Spleen: No splenomegaly. No suspicious focal mass lesion. Adrenals/Urinary Tract: No adrenal nodule or mass. Segmental hypoperfusion identified posterior right kidney (images 12 and 14 of series 8) are similar to the prior study interval stability is more suggestive of scarring been acute etiology such as pyelonephritis. Central sinus cysts are noted in the left kidney No evidence for hydroureter. The urinary bladder appears normal for the degree of distention. Stomach/Bowel: Stomach is nondistended although the gastric wall is ill-defined proximally. The posterior left hepatic lobe lesion is contiguous with the wall of the proximal stomach, obscuring the intervening fat plane. Duodenum is normally positioned as is the ligament of Treitz. Retroperitoneal edema surrounds the transverse duodenum. Duodenal diverticulum noted. No small bowel wall thickening. No small bowel dilatation. The terminal ileum is normal. The appendix is not well visualized, but there is no edema or inflammation in the region of the cecal tip to suggest appendicitis. No gross colonic mass. No colonic wall thickening. Diverticular changes are noted in the left colon without evidence of diverticulitis. Vascular/Lymphatic: There is advanced atherosclerotic calcification of the abdominal aorta without aneurysm.  Neither the left femoral artery nor the left profunda stent device are opacified, similar to prior and suggesting chronic occlusion. Calcified retroperitoneal/para-aortic lymph  nodes again noted, similar to prior. Lymph node in the region of the gastrohepatic ligament measured previously at 3.5 x 3.3 cm is 2.9 x 2.4 cm today on image 27/3. Low-density lesion between the lateral segment left liver and the esophagogastric junction was previously measured at 5.5 x 4.8 cm which compares to 5.7 x 5.6 cm today on image 17/3. Portal vein and superior mesenteric vein are patent. As noted above, the new pancreatic tail lesion invades the splenic vein which remains patent. No pelvic sidewall lymphadenopathy. Reproductive: The prostate gland and seminal vesicles are unremarkable. Scrotal edema evident. Other: Small to moderate volume free fluid is seen around the liver and spleen with fluid in the para colic gutters bilaterally and small volume free fluid in the pelvis. Musculoskeletal: No worrisome lytic or sclerotic osseous abnormality. Status post lumbosacral fusion. IMPRESSION: 1. Interval progression of metastatic disease in the liver with interval enlargement of index lesions. 2. New 3.8 x 2.9 cm necrotic lesion in the tail of the pancreas with invasion of the splenic vein. Splenic vein remains patent at this time. 3. New small to moderate volume ascites. 4. Interval development of small right and small to moderate left pleural effusions with dependent collapse/consolidative opacity in both lung bases. 5. Upper abdominal lymphadenopathy is similar to prior. 6. 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 7.  Aortic Atherosclerosis (ICD10-I70.0). These results will be called to the ordering clinician or representative by the Radiologist Assistant, and communication documented in the PACS or [employer]. Electronically Signed   By: Kennith Center M.D.   On: 03/06/2023 08:19    Medications: I have reviewed the patient's current medications.  Assessment/Plan:  Metastatic adenocarcinoma, likely esophagus primary - Liver mass, mass or lymph node conglomerate  near the GE junction, gastrohepatic ligament and retroperitoneal lymphadenopathy. -02/11/2022 CTA chest-7.8 x 8.5 cm subtle hypoattenuating mass lesion in segment IV of the liver. -02/11/2022 CT abdomen/pelvis-8.7 x 7.8 or ill-defined irregular mass in the medial segment left liver, 4.5 x 3.4 cm necrotic mass lesion just cranial to the esophagogastric junction with metastatic lymphadenopathy in the abdomen and pelvis, irregular nodular peritoneal thickening in the lower abdomen and pelvis -02/11/2022 MRI abdomen-rim-enhancing likely necrotic mass or lymph node conglomerate centered around the gastroesophageal junction measuring 5.0 x 3.9 cm, large rim-enhancing likely necrotic liver mass centered in the anterior left lobe of the liver measuring 7.8 x 7.2 cm, enlarged gastrohepatic ligament and retroperitoneal lymph nodes -Labs from 02/11/2022-AFP 2.4, CA 19.9 88, CEA 287 -Ultrasound-guided liver biopsy performed 02/15/2022-adenocarcinoma with extensive necrosis, CK7 positive, negative for CK20, CDX2, TTF-1, and PAX8, consistent with a pancreatic, lung, or upper gastrointestinal primary -PET 03/08/2022-hypermetabolic distal esophagus mass, hypermetabolic centrally necrotic hepatic mass, hypermetabolic periportal nodes, exophytic GE junction mass without hypermetabolism-likely benign lesion, no hypermetabolism in the pancreas head, bilateral hypermetabolic adrenal gland lesions, small hypermetabolic left parotid gland lesion.  No loss of mismatch repair protein expression, HER2 3+ -CTs 03/11/2022-no change from PET 03/08/2022, known distal esophagus mass-not visualized on unenhanced CT, large hepatic metastasis and upper abdominal nodal metastases, chronic pancreatitis with 4.3 cm pseudocyst in the pancreas head -Cycle 1 FOLFOX, trastuzumab, Pembrolizumab 04/06/2022 -Cycle 2 oxaliplatin, 5-FU bolus/pump and trastuzumab held due to hospitalization following cycle 1 with possible coronary vasospasm -Pembrolizumab  04/25/2022 -CT abdomen/pelvis 05/02/2022-increased peripancreatic edema, increased size of pancreas head  pseudocyst, increased necrotic nodal masses at the GE junction -Cycle 3 oxaliplatin, trastuzumab, pembrolizumab 05/15/2022; 5-FU held secondary to cardiac toxicity following cycle 1 -Cycle 4 oxaliplatin, trastuzumab 05/29/2022, 5-FU held secondary to cardiac toxicity -Cycle 5 oxaliplatin, trastuzumab, Pembrolizumab (6-week dosing for Pembrolizumab) 06/12/2022, 5-FU held secondary to cardiac toxicity -Cycle 6 oxaliplatin, trastuzumab 06/28/2022, 5-FU held -Cycle 7 oxaliplatin, trastuzumab 07/17/2022, 5-FU held -CTs 07/19/2022-decrease in the dominant anterior liver mass, 2 new small liver lesions compared to 05/02/2022, new right lower lobe nodule, enlargement of epicardial lymph nodes, slight decrease in size of celiac axis, gastropathic ligament, and portacaval lymphadenopathy, unchanged esophageal thickening -Cycle 8 oxaliplatin and pembrolizumab 08/14/2022, 5-FU and trastuzumab held secondary to potential cardiac toxicity -CTs 09/12/2022-increased number hepatic metastases, few are larger; multiple abnormal lymph nodes identified in the abdomen and lower mediastinum, relatively similar; increased stranding along the mesentery and retroperitoneum; persistent wall thickening of the duodenum with slight ectasia of the lumen; persistent left hepatic lobe biliary duct dilatation, likely compressive from adjacent metastatic lesions; previous 6 mm right lower lobe lung nodule obscured by motion. -Cycle 1 Taxol/cyramza 09/20/2022 -Cycle 2 Taxol/Cyramza 10/04/2022 -Cycle 3 Taxol/Cyramza 10/19/2022 (Taxol dose reduced secondary to thrombocytopenia) -Cycle 4 Taxol/cyramza 11/16/2022 -Cycle 5 Taxol/Cyramza 11/30/2022 -CT chest emergency department 12/17/2022 due to complaint of dyspnea-no enlarged mediastinal, hilar or axillary lymph nodes.  New 7 mm lung nodule seen within the superior segment right lower lobe.   Additional 9 mm posterior right lower lobe lung nodule, previously measured 5 mm on prior study.  Small bilateral pleural effusions.  Multiple liver masses of various sizes, present on the prior exam.  4.6 x 3.7 low-attenuation extending from the posterior aspect of the left lobe of the liver to the region of the gastric fundus, previously measured 4.8 x 3.9 cm -Cycle 6 Taxol/Cyramza 01/08/2023 -CT chest 02/28/2023-slightly less conspicuous right upper lobe peripheral groundglass opacities, likely increase in size of multiple poorly defined hepatic masses, increase in size of a gastroesophageal/gastric fundus mass -CT abdomen/pelvis 03/05/2023, compared to 09/12/2022-enlargement of metastatic liver lesions, "new "pancreas tail mass with invasion of the splenic vein (present on 12/17/2022 chest CT by my review), new small to moderate volume ascites, stable upper abdominal adenopathy   2.  Myocardial infarction/cardiac arrest 01/31/2022 -Echocardiogram 02/01/2022-LVEF 60-65%, right ventricle severely dilated with hypokinesis in the basal and midportion, hypokinesis in the apical portion, right ventricular systolic function moderately reduced -Echocardiogram on 05/01/2022, LVEF 50-55%, decreased right ventricular systolic function, moderately enlarged right ventricle -Echocardiogram 08/05/2022-LVEF 45-50%, mildly decreased left ventricular function, moderate reduced right ventricular function 3.  Microcytic anemia -Labs from 02/11/2022-ferritin 151, iron 13, percent saturation 5% 4.  DVT/pulmonary embolism-bilateral lower extremity DVTs 01/10/2022, pulmonary emboli on chest CT 01/31/2022-apixaban 5.  PAD status post AKA in 2021 6.  RA 7.  Bilateral pleural effusions -Ultrasound-guided thoracentesis performed on 02/11/2022.  Cytology negative for malignancy. 8.  COPD 9.  Admission 03/11/2022 with E. coli bacteremia Ultrasound-guided biopsy/aspiration of dominant necrotic appearing liver lesion 03/17/2022-scant fluid  aspirated-negative culture, no organisms or white cells.  Pathology with necrotic tissue. 10.  Admission 04/29/2022 after a fall, multiple skull fractures 11.  Fever, hypotension-Klebsiella bacteremia on blood culture 04/30/2022 12.  Admission 04/09/2022 with chest pain, NSTEMI 13.  Admission 08/04/2022 with ventricular tachycardia, status post cardioversion, noted to have hypokalemia and hypomagnesemia, started on amiodarone 14.  Rash following cycle 3 Taxol/ramucirumab-admitted to [facility] due to concern for SJS.  SJS and disseminated zoster ruled out. 15.  Admission 12/27/2022 with sepsis syndrome, probable  aspiration pneumonia 16.  Admission 02/28/2023 with sepsis syndrome, blood culture positive for Klebsiella    [name] [name] has metastatic adenocarcinoma, likely of gastroesophageal origin.  He has been treated with multiple systemic therapy agents including chemotherapy, immunotherapy, and targeted therapy.  Systemic treatment options are limited, but we could consider salvage systemic therapy with deruxtecan and other HER2 directed therapies.  I reviewed the admission chest CT images.  Is difficult to accurately measure the liver lesion and apparent gastroesophageal mass without contrast.  The previously noted lung lesion is not seen.  The CEA was stable in July. I reviewed the 03/05/2023 CT findings and images.  It is difficult to assess the response to Taxol/ramucirumab given the comparison to a remote CT and the fragmented treatment over the past 6 months.  The "new "pancreas lesion appears to been present on a chest CT in June.  Our working diagnosis has been gastroesophageal cancer, but it is possible he has pancreas cancer.  The CT report was not available when I saw him early this morning.  I will return to discuss the CT findings with [name] [name] in the early a.m. on 03/07/2023.  He appears to have recovered from the Klebsiella sepsis.  The most likely source is the gastrointestinal tumor burden.  [name] has a poor prognosis from the metastatic cancer and multiple comorbid conditions.  He has indicated repeatedly he wishes to consider additional treatment.  We can consider continuation of Taxol/ramucirumab or switching to a different salvage regimen.  We will schedule outpatient follow-up at the Cancer center.  .   Recommendations: Antibiotics per the medical service Continue discussions regarding CODE STATUS and hospice care I will discuss the CT findings and treatment options with him again in the a.m. on 03/07/2023 Outpatient follow-up will be scheduled at the Cancer center    LOS: 6 days   Thornton Papas, MD   03/06/2023, 5:34 PM

## 2023-03-06 NOTE — Progress Notes (Signed)
   03/06/23 1637  Spiritual Encounters  Type of Visit Attempt (pt unavailable)  Conversation partners present during encounter Other (comment)  Reason for visit Routine spiritual support   Patient resting, did not disturb.

## 2023-03-07 ENCOUNTER — Encounter: Payer: Self-pay | Admitting: *Deleted

## 2023-03-07 DIAGNOSIS — Z515 Encounter for palliative care: Secondary | ICD-10-CM | POA: Diagnosis not present

## 2023-03-07 DIAGNOSIS — A419 Sepsis, unspecified organism: Secondary | ICD-10-CM | POA: Diagnosis not present

## 2023-03-07 DIAGNOSIS — R6521 Severe sepsis with septic shock: Secondary | ICD-10-CM | POA: Diagnosis not present

## 2023-03-07 DIAGNOSIS — Z7189 Other specified counseling: Secondary | ICD-10-CM | POA: Diagnosis not present

## 2023-03-07 DIAGNOSIS — F1190 Opioid use, unspecified, uncomplicated: Secondary | ICD-10-CM

## 2023-03-07 DIAGNOSIS — B961 Klebsiella pneumoniae [K. pneumoniae] as the cause of diseases classified elsewhere: Secondary | ICD-10-CM | POA: Diagnosis not present

## 2023-03-07 LAB — BASIC METABOLIC PANEL
Anion gap: 4 — ABNORMAL LOW (ref 5–15)
BUN: 8 mg/dL (ref 8–23)
CO2: 25 mmol/L (ref 22–32)
Calcium: 7.7 mg/dL — ABNORMAL LOW (ref 8.9–10.3)
Chloride: 102 mmol/L (ref 98–111)
Creatinine, Ser: 0.67 mg/dL (ref 0.61–1.24)
GFR, Estimated: >60 mL/min (ref >60)
Glucose, Bld: 79 mg/dL (ref 70–99)
Potassium: 3.8 mmol/L (ref 3.5–5.1)
Sodium: 131 mmol/L — ABNORMAL LOW (ref 135–145)

## 2023-03-07 LAB — GLUCOSE, CAPILLARY
Glucose-Capillary: 77 mg/dL (ref 70–99)
Glucose-Capillary: 78 mg/dL (ref 70–99)
Glucose-Capillary: 101 mg/dL — ABNORMAL HIGH (ref 70–99)

## 2023-03-07 MED ORDER — POLYETHYLENE GLYCOL 3350 17 G PO PACK: 17.0000 g | PACK | Freq: Every day | ORAL | 0 refills | Status: AC | PRN

## 2023-03-07 MED ORDER — HEPARIN SOD (PORK) LOCK FLUSH 100 UNIT/ML IV SOLN
500.0000 [IU] | INTRAVENOUS | Status: AC | PRN
  Administered 2023-03-07: 500 [IU]

## 2023-03-07 NOTE — Progress Notes (Signed)
   03/07/23 1338  Spiritual Encounters  Type of Visit Follow up  Care provided to: Patient  Conversation partners present during encounter Nurse  Referral source Other (comment)  Reason for visit Advance directives  OnCall Visit No  Advance Directives (For Healthcare)  Does Patient Have a Medical Advance Directive? Yes  Does patient want to make changes to medical advance directive? Yes (ED - Information included in AVS)   Chaplain visited with patient and discussed HCPOA. Chaplain spoke with [name] and discussed HCPOA. HCPOA processed appointing [name] and Geoffry Paradise as HCPOA agents.

## 2023-03-07 NOTE — Progress Notes (Signed)
PATIENT NAVIGATOR PROGRESS NOTE  Name: [name] [name] Date: 03/07/2023 MRN: [id number]  DOB: 05-09-1961   Reason for visit:  Dr Truett Perna request [employer] testing  Comments:  Testing ordered entered for accession number [id number]    Time spent counseling/coordinating care: 30-45 minutes

## 2023-03-07 NOTE — Progress Notes (Signed)
Occupational Therapy Treatment Patient Details Name: [name] [name] MRN: [id number] DOB: 01-21-1961 Today's Date: 03/07/2023   History of present illness 62 y.o male presented on 9/4 via EMS 2/2 AMS and fall, noted to be hypotensive and bradycardic. Admitted to ICU for pressor requirement, did not have respiratory distress. Found to have Klebsiella bacteremia and currently on IV antibiotics. PMH: metastatic adenocarcinoma (currently on chemo), multiple DVT/PE on AC, HTN, HLD, CAD s/p CABG 2020, many recent admissions   OT comments  Pt is making limited progress towards their acute OT goals; however he is likely he his functional baseline and has plans to d/c home today. Upon arrival pt attempting to eat. He needed assist with all packages and BUE support. He benefits from bimanually holding utensils and cups to bring to the mouth but continues to spill a lot. Mod A needed for rolling and total A needed for boosting. Pt declines OOB attempt due to d/c home this afternoon. OT to continue to follow acutely to facilitate progress towards established goals. Pt has declined SNF therefore he will benefit from [facility] and a Hoyer lift .       If plan is discharge home, recommend the following:  A lot of help with walking and/or transfers;A lot of help with bathing/dressing/bathroom;Assistance with cooking/housework;Direct supervision/assist for medications management;Direct supervision/assist for financial management;Assist for transportation;Help with stairs or ramp for entrance   Equipment Recommendations  Hoyer lift       Precautions / Restrictions Precautions Precautions: Fall Precaution Comments: L AKA Restrictions Weight Bearing Restrictions: No       Mobility Bed Mobility Overal bed mobility: Needs Assistance Bed Mobility: Rolling Rolling: Mod assist         General bed mobility comments: dependent for boosting up in the bed    Transfers                         Balance                                            ADL either performed or assessed with clinical judgement   ADL Overall ADL's : Needs assistance/impaired Eating/Feeding: Minimal assistance Eating/Feeding Details (indicate cue type and reason): pt has difficulty with open cups, he requires a straw to prevent spills and to reach his mouth. He must use both hands bimanually for eating tasks. Spills a lot.                                 Functional mobility during ADLs: Maximal assistance;+2 for safety/equipment;+2 for physical assistance General ADL Comments: focused on self feeding this date    Extremity/Trunk Assessment Upper Extremity Assessment Upper Extremity Assessment: RUE deficits/detail;LUE deficits/detail RUE Deficits / Details: severe RA, minimal movement of digits, unable to get to full elbow extension. limited shoulder ROM RUE Coordination: decreased fine motor;decreased gross motor LUE Deficits / Details: severe RA, minimal movement of digits, unable to get to full elbow extension. limited shoulder ROM LUE Coordination: decreased gross motor;decreased fine motor   Lower Extremity Assessment Lower Extremity Assessment: Defer to PT evaluation        Vision   Vision Assessment?: No apparent visual deficits   Perception Perception Perception: Not tested   Praxis Praxis Praxis: Not tested    Cognition Arousal:  Alert Behavior During Therapy: Flat affect Overall Cognitive Status: No family/caregiver present to determine baseline cognitive functioning                                 General Comments: limited insight, flat affect. motived to work on self feeding this date        Exercises      Shoulder Instructions       General Comments vss, pt asking for pain medication    Pertinent Vitals/ Pain       Pain Assessment Pain Assessment: Faces Faces Pain Scale: Hurts little more Pain Location: generalize / low  back Pain Descriptors / Indicators: Discomfort, Sore Pain Intervention(s): Limited activity within patient's tolerance, Monitored during session  Home Living                                          Prior Functioning/Environment              Frequency  Min 1X/week        Progress Toward Goals  OT Goals(current goals can now be found in the care plan section)  Progress towards OT goals: Progressing toward goals  Acute Rehab OT Goals Patient Stated Goal: to go home OT Goal Formulation: With patient Time For Goal Achievement: 03/17/23 Potential to Achieve Goals: Good ADL Goals Pt Will Perform Grooming: with set-up;sitting Pt Will Perform Upper Body Dressing: with set-up;sitting Pt Will Perform Lower Body Dressing: with mod assist;sit to/from stand Pt Will Transfer to Toilet: with min assist;stand pivot transfer Additional ADL Goal #1: pt will indep complete bed mobility with mod I as a precursor to ADLs  Plan      Co-evaluation                 AM-PAC OT "6 Clicks" Daily Activity     Outcome Measure   Help from another person eating meals?: A Little Help from another person taking care of personal grooming?: A Little Help from another person toileting, which includes using toliet, bedpan, or urinal?: A Lot Help from another person bathing (including washing, rinsing, drying)?: A Lot Help from another person to put on and taking off regular upper body clothing?: A Lot Help from another person to put on and taking off regular lower body clothing?: Total 6 Click Score: 13    End of Session    OT Visit Diagnosis: Unsteadiness on feet (R26.81);Other abnormalities of gait and mobility (R26.89);Muscle weakness (generalized) (M62.81)   Activity Tolerance Patient tolerated treatment well   Patient Left in bed;in CPM   Nurse Communication Mobility status        Time: 1440-1456 OT Time Calculation (min): 16 min  Charges: OT General  Charges $OT Visit: 1 Visit OT Treatments $Self Care/Home Management : 8-22 mins  Derenda Mis, OTR/L Acute Rehabilitation Services Office [phone] Secure Chat Communication Preferred   Donia Pounds 03/07/2023, 3:04 PM

## 2023-03-07 NOTE — Progress Notes (Addendum)
Palliative Medicine Inpatient Follow Up Note HPI: [name] [name] is a 62 y.o. 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 He was in the ICU From 9/4-9/8 for septic shock secondary to Klebsiella bacteremia requiring vasopressor support. Pressors were discontinued on 9/7 and he is stable to transfer to the floors today, 9/9.   The Palliative care team has been involved to support additional goals of care conversations.   Today's Discussion 03/07/2023  *Please note that this is a verbal dictation therefore any spelling or grammatical errors are due to the "Dragon [employer] One" system interpretation.  Chart reviewed inclusive of vital signs, progress notes, laboratory results, and diagnostic images.   I met with [name] [name] at bedside this morning. He is awake and alert. We discussed his current prognosis in the setting of his metastatic adenocarcinoma. I shared that per review of Dr. Kalman Drape notes it does appear that hospice is a reasonable consideration. I described hospice as a service for patients who have a life expectancy of 6 months or less.  The goal of hospice is the preservation of dignity and quality at the end phases of life. Under hospice care, the focus changes from curative to symptom relief.   [name] expresses that he still feels he can have a fair outcome with cancer treatment. I shared openly and honestly that he is weaker and more debilitated than he was two months ago and treatment is not going to provide him with cure. I shared often times cancer treatment can cause harm and injury as opposed to improvement. [name] shares, "we'll see about that", I shared that I hear him expressing a great deal of hope for the future - he remains optimistic to remain present for as long as able.   WE talked more  about resuscitative efforts. I shared again, openly and honestly that the measures taken to resuscitate individuals can often cause greater trauma than benefit. I shared that in these cases with someone as frail as he is I would offer confidently that he will not be the same person after such an event as he was before. [name] feels at this time he would desire taking the risk of resuscitation.  I asked [name] if he feels confident in his friends ability to care for him which he states that he does. I asked [name] if he has any concerns about physical or financial neglect/abuse which he denied. He does believe that [name] and [name] has his best interest in mind.   We reviewed that when [name] does discharge that the OP Palliative care has been ordered. He did not know why he didn't follow up in the OP clinic. It appears someone going to his home will likely be easier to support him from a palliative perspective.   Questions and concerns addressed/Palliative Support Provided.   Objective Assessment: Vital Signs Vitals:   03/07/23 0500 03/07/23 0804  BP: 99/68 119/75  Pulse: 70 66  Resp: 18 18  Temp: (!) 97.3 F (36.3 C) 97.6 F (36.4 C)  SpO2: 93% 99%    Intake/Output Summary (Last 24 hours) at 03/07/2023 1046 Last data filed at 03/07/2023 0838 Gross per 24 hour  Intake 120 ml  Output 1500 ml  Net -1380 ml   Last Weight  Most recent update: 03/07/2023  5:23 AM    Weight  59.4 kg (130 lb 15.3 oz)            Gen:  Elderly Caucasian M in NAD HEENT: moist mucous membranes CV: Regular rate and rhythm  PULM:  On  RA breathing is even and nonlabored ABD: soft/nontender  EXT: LLE AKA Neuro: Alert and oriented x3  SUMMARY OF RECOMMENDATIONS   FULL CODE --> strongly recommended reviewing completion of the MOST form as well as consideration of DNAR/DNI given underlying disease burden   Appreciate Chaplain support designating HCPOA  Patient desires continued cancer treatment - it has  been explained that this will not be curative  TOC - OP Palliative support on discharge   Ongoing palliative care support  Billing based on MDM: High ______________________________________________________________________________________ Lamarr Lulas [place] Palliative Medicine Team Team Cell Phone: [phone] Please utilize secure chat with additional questions, if there is no response within 30 minutes please call the above phone number  Palliative Medicine Team providers are available by phone from 7am to 7pm daily and can be reached through the team cell phone.  Should this patient require assistance outside of these hours, please call the patient's attending physician.

## 2023-03-07 NOTE — TOC Transition Note (Addendum)
Transition of Care [facility]) - CM/SW Discharge Note   Patient Details  Name: [name] [name] MRN: [id number] Date of Birth: 07-17-1960  Transition of Care [facility]) CM/SW Contact:  Janae Bridgeman, RN Phone Number: 03/07/2023, 1:01 PM   Clinical Narrative:    CM spoke with attending physician team and the patient should likely be able to discharge home today with his caregiver.  PT note states that patient is able to be transported via ambulance transport.  I spoke with the patient at the bedside and he declines and states that he has a wheelchair and ramp at the house and would prefer caregivers at the home take him home by car.  Chaplain services were present at the bedside and should complete the patient paperwork at the bedside in the next 30 minutes.  Charge nurse was notified that patient plans to discharge via car today.  Caregivers will need to bring the patient's wheelchair to the hospital.  I called [employer] and Michiel Sites lift should be delivered today/tomorrow to the home.  03/07/23 - D/C orders are placed.  I called family and they plan to pick the patient up by car after 4 pm.  Charge RN is aware.     Barriers to Discharge: Continued Medical Work up   Patient Goals and CMS Choice CMS Medicare.gov Compare Post Acute Care list provided to:: Patient Represenative (must comment) Choice offered to / list presented to : [facility] POA / Guardian  Discharge Placement                         Discharge Plan and Services Additional resources added to the After Visit Summary for       Post Acute Care Choice: Home Health, Durable Medical Equipment                    HH Arranged: RN, PT, OT, Nurse's Aide [facility] Agency: [facility] Date [facility] Agency Contacted: 03/04/23   Representative spoke with at [facility] Agency: Rivka Barbara  Social Determinants of Health (SDOH) Interventions SDOH Screenings   Food Insecurity: No Food Insecurity (11/10/2022)  Housing: Low Risk  (11/10/2022)   Transportation Needs: Unmet Transportation Needs (11/10/2022)  Utilities: Not At Risk (11/10/2022)  Alcohol Screen: Low Risk  (11/10/2022)  Depression (PHQ2-9): Medium Risk (11/10/2022)  Financial Resource Strain: Low Risk  (11/10/2022)  Physical Activity: Insufficiently Active (11/10/2022)  Social Connections: Moderately Isolated (11/10/2022)  Stress: No Stress Concern Present (11/10/2022)  Tobacco Use: Medium Risk (03/02/2023)     Readmission Risk Interventions    03/06/2023    2:36 PM 01/01/2023    4:52 PM 08/09/2022   11:59 AM  Readmission Risk Prevention Plan  Transportation Screening Complete Complete Complete  Medication Review Oceanographer) Complete Complete Complete  PCP or Specialist appointment within 3-5 days of discharge Complete    HRI or Home Care Consult Complete Complete Complete  SW Recovery Care/Counseling Consult Complete Complete Complete  Palliative Care Screening Complete Not Applicable Not Applicable  Skilled Nursing Facility Not Applicable Patient Refused Not Applicable

## 2023-03-07 NOTE — Discharge Summary (Signed)
Name: [name] [name] MRN: [id number] DOB: Sep 16, 1960 62 y.o. PCP: Rocky Morel, DO  Date of Admission: 02/28/2023  4:29 PM Date of Discharge: 03/07/2023  Attending Physician: Dr. Antony Contras  DISCHARGE DIAGNOSIS:  Primary Problem: Septic shock [facility])   Hospital Problems: Principal Problem:   Septic shock (HCC) Active Problems:   Metastatic adenocarcinoma to liver [facility])   DNR (do not resuscitate) discussion   Bacteremia due to Klebsiella pneumoniae   Pressure injury of skin   Goals of care, counseling/discussion    DISCHARGE MEDICATIONS:   Allergies as of 03/07/2023       Reactions   Dilaudid [hydromorphone Hcl] Rash   Ramipril Rash, Other (See Comments)   Per patient, rash was on the right arm and leg only; no angioedema   Tramadol Itching, Rash        Medication List     STOP taking these medications    amLODipine 10 MG tablet Commonly known as: NORVASC   isosorbide mononitrate 30 MG 24 hr tablet Commonly known as: IMDUR       TAKE these medications    albuterol 108 (90 Base) MCG/ACT inhaler Commonly known as: VENTOLIN HFA INHALE 2 PUFFS INTO THE LUNGS EVERY 6 (SIX) HOURS AS NEEDED FOR WHEEZING OR SHORTNESS OF BREATH.   amiodarone 200 MG tablet Commonly known as: Pacerone Take 1 tablet (200 mg total) by mouth 2 (two) times daily.   atorvastatin 40 MG tablet Commonly known as: LIPITOR Take 1 tablet (40 mg total) by mouth daily at 6 PM.   clopidogrel 75 MG tablet Commonly known as: PLAVIX Take 75 mg by mouth daily.   diclofenac Sodium 1 % Gel Commonly known as: VOLTAREN Apply 2 g topically 4 (four) times daily.   DULoxetine 30 MG capsule Commonly known as: CYMBALTA Take 30 mg by mouth daily as needed (sleep).   Eliquis 5 MG Tabs tablet Generic drug: apixaban TAKE 1 TABLET (5 MG TOTAL) BY MOUTH 2 (TWO) TIMES DAILY.   feeding supplement Liqd Take 237 mLs by mouth 3 (three) times daily between meals.   ferrous sulfate 325 (65 FE) MG  tablet Take 1 tablet (325 mg total) by mouth daily with breakfast for 100 doses.   fluticasone-salmeterol 45-21 MCG/ACT inhaler Commonly known as: Advair HFA Inhale 2 puffs into the lungs 2 (two) times daily.   folic acid 1 MG tablet Commonly known as: FOLVITE Take 1 mg by mouth daily.   gabapentin 300 MG capsule Commonly known as: NEURONTIN Take 1 capsule (300 mg total) by mouth 2 (two) times daily. Daily or twice daily if tolerated.   hydrOXYzine 25 MG tablet Commonly known as: ATARAX Take 25 mg by mouth at bedtime.   lidocaine-prilocaine cream Commonly known as: EMLA Apply 1 Application topically as directed. Apply to port site 2 hours prior to stick and cover with Press-and-Seal to numb port before access   loperamide 2 MG tablet Commonly known as: IMODIUM A-D Take 2 mg by mouth 4 (four) times daily as needed for diarrhea or loose stools.   meloxicam 7.5 MG tablet Commonly known as: MOBIC Take 1 tablet (7.5 mg total) by mouth daily as needed for pain.   Nyamyc powder Generic drug: nystatin Apply 1 Application topically 2 (two) times daily.   ondansetron 8 MG tablet Commonly known as: ZOFRAN START TAKING 72 HOURS AFTER CHEMOTHERAPY TREATMENT. What changed: See the new instructions.   Oxycodone HCl 10 MG Tabs Take 10 mg by mouth every 6 (six) hours.  polyethylene glycol 17 g packet Commonly known as: MIRALAX / GLYCOLAX Take 17 g by mouth daily as needed for moderate constipation.   potassium chloride 10 MEQ tablet Commonly known as: KLOR-CON TAKE 2 TABLETS(20 MEQ) BY MOUTH DAILY   prochlorperazine 10 MG tablet Commonly known as: COMPAZINE TAKE 1 TABLET (10 MG TOTAL) BY MOUTH EVERY 6 (SIX) HOURS AS NEEDED.               Durable Medical Equipment  (From admission, onward)           Start     Ordered   03/06/23 1405  For home use only DME Other see comment  Once       Comments: Needs Hoyer lift for generalized weakness and inability to transfer  without lift device.  Question:  Length of Need  Answer:  6 Months   03/06/23 1405              Discharge Care Instructions  (From admission, onward)           Start     Ordered   03/07/23 0000  Discharge wound care:       Comments: Wound care  Daily      Comments: Cleanse sacrum/coccyx DTPI with soap and water, apply Xeroform gauze [place] [phone]) to purple maroon discoloration daily.  May secure with silicone foam or ABD pad whichever is preferred.   03/07/23 1354            DISPOSITION AND FOLLOW-UP:  [name] [name] was discharged from [facility] in Stable condition. At the hospital follow up visit please address:  Follow-up Recommendations:  Septic shock Klebsiella bacteremia -Repeat CBC/screen for signs of infection   Metastatic (presumed) esophageal cancer Goals of care -Discuss current treatment plan with Oncologist, Dr. Truett Perna and determine goals of care.   Chronic right sided heart failure Cardiomyopathy Consider GDMT if vitals are tolerable   History of VT/VF CAD -Ensure compliance with Plavix, Amiodarone and Atorvastatin  HTN -Consider re-starting BP medications if patient is hypertensive or has angina (see below).  Follow-up Appointments:  Follow-up Information     [facility] Follow up.   Specialty: Home Health Services Why: [facility] will be providing home health services.  They will call you to set up services in the next 24-48 hours when he returns home. Contact information: [address] [place] [address] [phone]         [facility] Follow up.   Why: [employer] will be providing a Nurse, adult delivery to your home. Contact information: [address] [place] [address] [phone]         [facility] Follow up.   Specialty: PALLIATIVE CARE Why: [facility] will be providing Outpatient Palliative Care services for  home. Contact information: [address] [place] [address] [phone]                HOSPITAL COURSE:  Patient Summary:  [name] [name] is a 62 y.o. 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 He was in the ICU From 9/4-9/8 for septic shock secondary to Klebsiella bacteremia requiring vasopressor support. Pressors were discontinued on 9/7 and he is stable to transfer to  the floors today, 9/9.   Septic shock Klebsiella bacteremia Admitted to the ICU from 9/4-9/8 due to septic shock requiring vasopressor support. Patient initially started on Cefepime but this was de-escalated to Rocephin and patient completed a 7 day course. Afebrile, BP's stable, and without leukocytosis on day of discharge. The source is unclear, but per Oncology, suspected source is advanced tumor burden in GI tract (see below).   Metastatic (presumed) esophageal cancer Goals of care The patient has been treated with multiple systemic therapies including chemo, immunotherapy, and targeted therapy in the past. Repeat CTAP during admission was significant for advanced metastatic disease and progression from previous scans. These findings were relayed to patient and after several discussions with primary team, palliative medicine, and oncology, patient decided to proceed with continued treatment. The fact that further treatment was not curative was explained in detail and patient was understanding of this. Patient to continue treatment per primary Oncologist, Dr. Truett Perna.    Chronic right sided heart failure Cardiomyopathy EF noted to be 55-60% this admission with severely reduced RV function and severely enlarged RV and dilated IVC. Difficulty with management stemming from recent episodes of significant  hypotension. Patient euvolemic on discharge and primary team deferred further management to PCP/Oncology.    History of VT/VF CAD LHC 01/2022 showed small non-dominant RCA occlusion. Patient did not endorse any chest pain. Patient's Plavix was continued but statin and Amiodarone were held during admission in the setting of elevated LFT's. LFT's normalized in days leading up to discharge and patient was restarted back on both on discharge.  HTN Patient's home Amlodipine and Imdur (also used for angina) was held due to hypotension. BP was stable while off medications so these were discontinued on discharge.  DISCHARGE INSTRUCTIONS:   Discharge Instructions     Diet - low sodium heart healthy   Complete by: As directed    Discharge instructions   Complete by: As directed    It was a pleasure being a part of your care team:  You were hospitalized due to an infection in your blood stream that caused a significant decrease in your blood pressure. Because of this, you were in the ICU for management of your blood pressure. As this improved, you were discharged from the ICU and the internal medicine team took over you care and completed the antibiotics that were used for the infection. During the course of hospitalization a scan of your abdomen was done that unfortunately revealed progression of your cancer. Please follow-up with your Oncologist, Dr. Truett Perna with your plan regarding your cancer. Please take all medications as prescribed. We have temporarily discontinued your at-home blood pressure medications as you came in with very low blood pressure. Please do not take these unless your future healthcare team determines it is necessary. All the best, and the internal medicine team will be thinking about you going forward!   Discharge wound care:   Complete by: As directed    Wound care  Daily      Comments: Cleanse sacrum/coccyx DTPI with soap and water, apply Xeroform gauze [place] [phone]) to purple  maroon discoloration daily.  May secure with silicone foam or ABD pad whichever is preferred.   Increase activity slowly   Complete by: As directed        SUBJECTIVE:  Patient is doing well considering and is okay with discharge Discharge Vitals:   BP 111/81 (BP Location: Right Arm)   Pulse 71   Temp 97.8 F (36.6 C) (Oral)  Resp 17   Wt 59.4 kg   SpO2 98%   BMI 19.91 kg/m   OBJECTIVE:   Physical Exam: Constitutional: chronically ill-appearing, lying in bed, in no acute distress Cardiovascular: regular rate and rhythm, no m/r/g  Pertinent Labs, Studies, and Procedures:     Latest Ref Rng & Units 03/06/2023    3:03 AM 03/04/2023    3:53 AM 03/03/2023   11:00 AM  CBC  WBC 4.0 - 10.5 K/uL 4.0  3.6  5.5   Hemoglobin 13.0 - 17.0 g/dL 9.8  9.4  16.1   Hematocrit 39.0 - 52.0 % 31.2  30.3  31.9   Platelets 150 - 400 K/uL 108  91  112        Latest Ref Rng & Units 03/07/2023    3:00 AM 03/06/2023    3:03 AM 03/05/2023    3:38 AM  CMP  Glucose 70 - 99 mg/dL 79  76  98   BUN 8 - 23 mg/dL 8  11  13    Creatinine 0.61 - 1.24 mg/dL 0.96  0.45  4.09   Sodium 135 - 145 mmol/L 131  133  134   Potassium 3.5 - 5.1 mmol/L 3.8  3.8  3.9   Chloride 98 - 111 mmol/L 102  103  103   CO2 22 - 32 mmol/L 25  24  25    Calcium 8.9 - 10.3 mg/dL 7.7  7.7  7.7   Total Protein 6.5 - 8.1 g/dL   5.2   Total Bilirubin 0.3 - 1.2 mg/dL   0.5   Alkaline Phos 38 - 126 U/L   363   AST 15 - 41 U/L   39   ALT 0 - 44 U/L   33     US Abdomen Limited RUQ (LIVER/GB)  Result Date: 03/01/2023 CLINICAL DATA:  Biliary obstruction. History of gastroesophageal cancer. EXAM: ULTRASOUND ABDOMEN LIMITED RIGHT UPPER QUADRANT COMPARISON:  CT 09/12/2022.  CT angiogram chest 02/28/2023 FINDINGS: Gallbladder: Surgically removed Common bile duct: Diameter: 2 mm. There are some mild areas of intrahepatic biliary duct ectasia as seen on previous examinations. There is significant biliary gas on the prior CT scan in the left  hepatic lobe Liver: Heterogeneous echogenic liver with multiple mass lesions as seen previously consistent with known metastatic disease. Please correlate with the prior CT. Portal vein is patent on color Doppler imaging with normal direction of blood flow towards the liver. Other: Mild ascites. IMPRESSION: Previous cholecystectomy. No common ductal dilatation. There is some areas of intrahepatic biliary duct ectasia as seen on prior CT scans. Air in the biliary tree on the prior CT. Heterogeneous liver with multiple mass lesions consistent with known liver metastases better seen on the prior examinations. Trace ascites Electronically Signed   By: Karen Kays M.D.   On: 03/01/2023 13:35   ECHOCARDIOGRAM COMPLETE  Result Date: 03/01/2023    ECHOCARDIOGRAM REPORT   Patient Name:   [name] [name] Date of Exam: 03/01/2023 Medical Rec #:  [id number]     Height:       68.0 in Accession #:    [id number]    Weight:       124.8 lb Date of Birth:  1960/11/06    BSA:          1.672 m Patient Age:    61 years      BP:           88/72 mmHg Patient Gender: M  HR:           53 bpm. Exam Location:  Inpatient Procedure: 2D Echo, Cardiac Doppler, Color Doppler and Intracardiac            Opacification Agent Indications:    Shock R57.9  History:        Patient has prior history of Echocardiogram examinations, most                 recent 12/27/2022. CAD; Risk Factors:Hypertension.  Sonographer:    Harriette Bouillon RDCS Referring Phys: [id number] JESSICA MARSHALL IMPRESSIONS  1. Left ventricular ejection fraction, by estimation, is 55 to 60%. The left ventricle has normal function. The left ventricle has no regional wall motion abnormalities. Left ventricular diastolic parameters are indeterminate.  2. Right ventricular systolic function is severely reduced. The right ventricular size is severely enlarged.  3. Right atrial size was severely dilated.  4. The mitral valve is normal in structure. Trivial mitral valve regurgitation. No  evidence of mitral stenosis.  5. Tricuspid valve regurgitation is moderate.  6. The aortic valve is normal in structure. Aortic valve regurgitation is trivial. No aortic stenosis is present.  7. The inferior vena cava is dilated in size with <50% respiratory variability, suggesting right atrial pressure of 15 mmHg. FINDINGS  Left Ventricle: Left ventricular ejection fraction, by estimation, is 55 to 60%. The left ventricle has normal function. The left ventricle has no regional wall motion abnormalities. Definity contrast agent was given IV to delineate the left ventricular  endocardial borders. The left ventricular internal cavity size was normal in size. There is no left ventricular hypertrophy. Left ventricular diastolic parameters are indeterminate. Right Ventricle: The right ventricular size is severely enlarged. Right ventricular systolic function is severely reduced. Left Atrium: Left atrial size was normal in size. Right Atrium: Right atrial size was severely dilated. Pericardium: There is no evidence of pericardial effusion. Mitral Valve: The mitral valve is normal in structure. Mild mitral annular calcification. Trivial mitral valve regurgitation. No evidence of mitral valve stenosis. Tricuspid Valve: The tricuspid valve is normal in structure. Tricuspid valve regurgitation is moderate . No evidence of tricuspid stenosis. Aortic Valve: The aortic valve is normal in structure. Aortic valve regurgitation is trivial. No aortic stenosis is present. Pulmonic Valve: The pulmonic valve was normal in structure. Pulmonic valve regurgitation is not visualized. No evidence of pulmonic stenosis. Aorta: The aortic root is normal in size and structure. Venous: The inferior vena cava is dilated in size with less than 50% respiratory variability, suggesting right atrial pressure of 15 mmHg. IAS/Shunts: No atrial level shunt detected by color flow Doppler.  LEFT VENTRICLE PLAX 2D LVIDd:         4.30 cm LVIDs:         3.60  cm LV PW:         1.10 cm LV IVS:        1.10 cm LVOT diam:     2.30 cm LV SV:         51 LV SV Index:   31 LVOT Area:     4.15 cm  RIGHT VENTRICLE            IVC RV S prime:     7.37 cm/s  IVC diam: 2.80 cm TAPSE (M-mode): 1.5 cm LEFT ATRIUM           Index        RIGHT ATRIUM           Index  LA diam:      3.60 cm 2.15 cm/m   RA Area:     24.10 cm LA Vol (A4C): 26.2 ml 15.67 ml/m  RA Volume:   78.50 ml  46.94 ml/m  AORTIC VALVE LVOT Vmax:   78.30 cm/s LVOT Vmean:  52.300 cm/s LVOT VTI:    0.123 m  AORTA Ao Root diam: 3.80 cm Ao Asc diam:  3.10 cm MITRAL VALVE MV Area (PHT): 3.86 cm    SHUNTS MV E velocity: 52.30 cm/s  Systemic VTI:  0.12 m                            Systemic Diam: 2.30 cm Olga Millers MD Electronically signed by Olga Millers MD Signature Date/Time: 03/01/2023/12:32:48 PM    Final    CT Angio Chest PE W and/or Wo Contrast  Result Date: 02/28/2023 CLINICAL DATA:  Pulmonary embolism (PE) suspected, high prob. Questionable sepsis. EXAM: CT ANGIOGRAPHY CHEST WITH CONTRAST TECHNIQUE: Multidetector CT imaging of the chest was performed using the standard protocol during bolus administration of intravenous contrast. Multiplanar CT image reconstructions and MIPs were obtained to evaluate the vascular anatomy. RADIATION DOSE REDUCTION: This exam was performed according to the departmental dose-optimization program which includes automated exposure control, adjustment of the mA and/or kV according to patient size and/or use of iterative reconstruction technique. CONTRAST:  75mL OMNIPAQUE IOHEXOL 350 MG/ML SOLN COMPARISON:  Chest x-ray 02/28/2019 FINDINGS: Cardiovascular: Satisfactory opacification of the pulmonary arteries to the segmental level. No evidence of pulmonary embolism. The main pulmonary artery is normal in caliber. Enlarged heart size. No significant pericardial effusion. The thoracic aorta is normal in caliber. At least moderate atherosclerotic plaque of the thoracic aorta. At least 3  vessel coronary artery calcifications. Right chest wall accessed Port-A-Cath with tip at superior cavoatrial junction. Mediastinum/Nodes: Esophageal lumen is filled with fluid. No enlarged mediastinal, hilar, or axillary lymph nodes. Thyroid gland and trachea demonstrate no significant findings. Lungs/Pleura: Limited evaluation due to motion artifact. Bilateral lower lobe atelectasis. Slightly less conspicuous right upper lobe peripheral ground-glass airspace opacities. Persistent few scattered peribronchovascular ground-glass airspace opacities. No focal consolidation. Previously identified right lower lobe pulmonary nodule not identified on this study and possibly resolved or underlying atelectasis. No pulmonary nodule. No pulmonary mass. No pleural effusion. No pneumothorax. Upper Abdomen: Incompletely evaluated likely increased in size multiple poorly defined and conglomerative hepatic masses. Pneumobilia. Musculoskeletal: No chest wall abnormality. No suspicious lytic or blastic osseous lesions. No acute displaced fracture. Old healed sternal fracture. Please see separately dictated thoracic spine CT 02/28/2023. Review of the MIP images confirms the above findings. IMPRESSION: 1. No pulmonary embolus. 2. Slightly less conspicuous right upper lobe peripheral ground-glass airspace opacities. Persistent few scattered peribronchovascular ground-glass airspace opacities. Findings may represent infection/inflammation or scarring with adenocarcinoma not excluded. Recommend continued follow-up. 3. Incompletely evaluated likely increased in size multiple poorly defined and conglomerative hepatic masses. 4. Interval increase in size of a 7.3 x 5.7 cm (from 4.6 x 3.7) gastroesophageal and gastric fundus mass. Associated fluid-filled esophageal lumen. No definite dilatation to suggest high-grade obstruction. 5. Aortic Atherosclerosis (ICD10-I70.0) and at least three-vessel coronary calcification. Electronically Signed    By: Tish Frederickson M.D.   On: 02/28/2023 19:30   CT T-SPINE NO CHARGE  Result Date: 02/28/2023 CLINICAL DATA:  fall on thinners back pain EXAM: CT THORACIC SPINE WITHOUT CONTRAST TECHNIQUE: Multidetector CT images of the thoracic were obtained using the standard protocol without  intravenous contrast. RADIATION DOSE REDUCTION: This exam was performed according to the departmental dose-optimization program which includes automated exposure control, adjustment of the mA and/or kV according to patient size and/or use of iterative reconstruction technique. COMPARISON:  CT angiography chest 02/11/2022 FINDINGS: Alignment: Exaggerated kyphotic curvature along the upper midthoracic spine likely positional. Otherwise normal alignment. Vertebrae: No acute fracture or focal pathologic process. Paraspinal and other soft tissues: Negative. Disc levels: Maintained. Other: Please see separately dictated CT angiography chest 02/28/2023. IMPRESSION: No acute displaced fracture or traumatic listhesis of the thoracic spine. Electronically Signed   By: Tish Frederickson M.D.   On: 02/28/2023 19:15   CT Head Wo Contrast  Result Date: 02/28/2023 CLINICAL DATA:  head trauma on AC; fall on thinners EXAM: CT HEAD WITHOUT CONTRAST CT CERVICAL SPINE WITHOUT CONTRAST TECHNIQUE: Multidetector CT imaging of the head and cervical spine was performed following the standard protocol without intravenous contrast. Multiplanar CT image reconstructions of the cervical spine were also generated. RADIATION DOSE REDUCTION: This exam was performed according to the departmental dose-optimization program which includes automated exposure control, adjustment of the mA and/or kV according to patient size and/or use of iterative reconstruction technique. COMPARISON:  CT head and C-spine 04/29/2022 FINDINGS: CT HEAD FINDINGS Brain: Chronic left occipital infarction. Patchy and confluent areas of decreased attenuation are noted throughout the deep and  periventricular white matter of the cerebral hemispheres bilaterally, compatible with chronic microvascular ischemic disease. No evidence of large-territorial acute infarction. No parenchymal hemorrhage. No mass lesion. No extra-axial collection. No mass effect or midline shift. No hydrocephalus. Basilar cisterns are patent. Vascular: No hyperdense vessel. Atherosclerotic calcifications are present within the cavernous internal carotid and vertebral arteries. Skull: No acute fracture or focal lesion. Sinuses/Orbits: Paranasal sinuses and mastoid air cells are clear. The orbits are unremarkable. Other: None. CT CERVICAL SPINE FINDINGS Alignment: Normal. Skull base and vertebrae: Multilevel mild-to-moderate degenerative changes of the spine. No associated severe osseous neural foraminal stenosis. No acute fracture. No aggressive appearing focal osseous lesion or focal pathologic process. Soft tissues and spinal canal: No prevertebral fluid or swelling. No visible canal hematoma. Upper chest: Unremarkable. Other: Atherosclerotic plaque of the carotid arteries within neck. Partially visualized right central venous catheter. IMPRESSION: 1. No acute intracranial abnormality. 2. No acute displaced fracture or traumatic listhesis of the cervical spine. Electronically Signed   By: Tish Frederickson M.D.   On: 02/28/2023 19:06   CT Cervical Spine Wo Contrast  Result Date: 02/28/2023 CLINICAL DATA:  head trauma on AC; fall on thinners EXAM: CT HEAD WITHOUT CONTRAST CT CERVICAL SPINE WITHOUT CONTRAST TECHNIQUE: Multidetector CT imaging of the head and cervical spine was performed following the standard protocol without intravenous contrast. Multiplanar CT image reconstructions of the cervical spine were also generated. RADIATION DOSE REDUCTION: This exam was performed according to the departmental dose-optimization program which includes automated exposure control, adjustment of the mA and/or kV according to patient size  and/or use of iterative reconstruction technique. COMPARISON:  CT head and C-spine 04/29/2022 FINDINGS: CT HEAD FINDINGS Brain: Chronic left occipital infarction. Patchy and confluent areas of decreased attenuation are noted throughout the deep and periventricular white matter of the cerebral hemispheres bilaterally, compatible with chronic microvascular ischemic disease. No evidence of large-territorial acute infarction. No parenchymal hemorrhage. No mass lesion. No extra-axial collection. No mass effect or midline shift. No hydrocephalus. Basilar cisterns are patent. Vascular: No hyperdense vessel. Atherosclerotic calcifications are present within the cavernous internal carotid and vertebral arteries. Skull: No acute fracture or  focal lesion. Sinuses/Orbits: Paranasal sinuses and mastoid air cells are clear. The orbits are unremarkable. Other: None. CT CERVICAL SPINE FINDINGS Alignment: Normal. Skull base and vertebrae: Multilevel mild-to-moderate degenerative changes of the spine. No associated severe osseous neural foraminal stenosis. No acute fracture. No aggressive appearing focal osseous lesion or focal pathologic process. Soft tissues and spinal canal: No prevertebral fluid or swelling. No visible canal hematoma. Upper chest: Unremarkable. Other: Atherosclerotic plaque of the carotid arteries within neck. Partially visualized right central venous catheter. IMPRESSION: 1. No acute intracranial abnormality. 2. No acute displaced fracture or traumatic listhesis of the cervical spine. Electronically Signed   By: Tish Frederickson M.D.   On: 02/28/2023 19:06   DG Chest Port 1 View  Result Date: 02/28/2023 CLINICAL DATA:  Questionable sepsis-evaluate for abnormality EXAM: PORTABLE CHEST 1 VIEW COMPARISON:  02/05/2023 FINDINGS: Right IJ Port-A-Cath with tip in the right atrium. Stable cardiomediastinal silhouette. Aortic atherosclerotic calcification. Hyperinflation. Increased interstitial coarsening in the right  lower lung compared with 02/05/2023. No focal consolidation, pleural effusion, or pneumothorax. No displaced rib fractures. IMPRESSION: Increased interstitial coarsening in the right lower lung compared to 02/05/2023. Findings may be due to volume loss, infection, or edema. Electronically Signed   By: Minerva Fester M.D.   On: 02/28/2023 18:23     Signed: Carmina Miller, DO  Internal Medicine Resident, PGY-1 [facility]  Pager: [phone] 5:49 PM, 03/07/2023

## 2023-03-07 NOTE — Plan of Care (Signed)
  Problem: Education: Goal: Knowledge of General Education information will improve Description: Including pain rating scale, medication(s)/side effects and non-pharmacologic comfort measures Outcome: Progressing   Problem: Health Behavior/Discharge Planning: Goal: Ability to manage health-related needs will improve Outcome: Progressing   Problem: Clinical Measurements: Goal: Ability to maintain clinical measurements within normal limits will improve Outcome: Progressing Goal: Will remain free from infection Outcome: Progressing Goal: Diagnostic test results will improve Outcome: Progressing Goal: Respiratory complications will improve Outcome: Progressing Goal: Cardiovascular complication will be avoided Outcome: Progressing   Problem: Activity: Goal: Risk for activity intolerance will decrease Outcome: Progressing   Problem: Nutrition: Goal: Adequate nutrition will be maintained Outcome: Progressing   Problem: Coping: Goal: Level of anxiety will decrease Outcome: Progressing   Problem: Elimination: Goal: Will not experience complications related to bowel motility Outcome: Progressing Goal: Will not experience complications related to urinary retention Outcome: Progressing   Problem: Pain Managment: Goal: General experience of comfort will improve Outcome: Progressing   Problem: Safety: Goal: Ability to remain free from injury will improve Outcome: Progressing   Problem: Skin Integrity: Goal: Risk for impaired skin integrity will decrease Outcome: Progressing   Problem: Fluid Volume: Goal: Hemodynamic stability will improve Outcome: Progressing   Problem: Clinical Measurements: Goal: Diagnostic test results will improve Outcome: Progressing Goal: Signs and symptoms of infection will decrease Outcome: Progressing   Problem: Respiratory: Goal: Ability to maintain adequate ventilation will improve Outcome: Progressing   Problem: Education: Goal:  Knowledge of condition and prescribed therapy will improve Outcome: Progressing   Problem: Cardiac: Goal: Will achieve and/or maintain adequate cardiac output Outcome: Progressing   Problem: Physical Regulation: Goal: Complications related to the disease process, condition or treatment will be avoided or minimized Outcome: Progressing

## 2023-03-07 NOTE — Progress Notes (Signed)
IP PROGRESS NOTE  Subjective:   [name] has no complaint.  He denies nausea, dysphagia, and pain.  Objective: Vital signs in last 24 hours: Blood pressure 99/68, pulse 70, temperature (!) 97.3 F (36.3 C), temperature source Oral, resp. rate 18, weight 130 lb 15.3 oz (59.4 kg), SpO2 93%.  Intake/Output from previous day: 09/10 0701 - 09/11 0700 In: -  Out: 950 [Urine:950]  Physical Exam:  HEENT: No thrush  Abdomen: Nontender, no hepatosplenomegaly, no mass Extremities: No right leg edema Neurologic: Alert and oriented, follows commands  Portacath/PICC-without erythema  Lab Results: Recent Labs    03/06/23 0303  WBC 4.0  HGB 9.8*  HCT 31.2*  PLT 108*    BMET Recent Labs    03/06/23 0303 03/07/23 0300  NA 133* 131*  K 3.8 3.8  CL 103 102  CO2 24 25  GLUCOSE 76 79  BUN 11 8  CREATININE 0.60* 0.67  CALCIUM 7.7* 7.7*    Lab Results  Component Value Date   CEA1 287.0 (H) 02/11/2022   CEA 191.50 (H) 01/08/2023   CAN199 88 (H) 02/11/2022    Studies/Results: CT ABDOMEN PELVIS W CONTRAST  Result Date: 03/06/2023 CLINICAL DATA:  Gastroesophageal cancer. Restaging. * Tracking Code: BO * EXAM: CT ABDOMEN AND PELVIS WITH CONTRAST TECHNIQUE: Multidetector CT imaging of the abdomen and pelvis was performed using the standard protocol following bolus administration of intravenous contrast. RADIATION DOSE REDUCTION: This exam was performed according to the departmental dose-optimization program which includes automated exposure control, adjustment of the mA and/or kV according to patient size and/or use of iterative reconstruction technique. CONTRAST:  75mL OMNIPAQUE IOHEXOL 350 MG/ML SOLN COMPARISON:  09/12/2022 FINDINGS: Lower chest: Small right and small to moderate left pleural effusions are associated with dependent collapse/consolidative opacity in both lung bases. Hepatobiliary: Bulky metastatic disease again noted in the liver. Posterior right hepatic lobe lesion  measuring 4.0 x 2.4 cm on image 17/3 was 2.5 x 2.2 cm previously (remeasured). Anterior subcapsular index lesion measured previously at 2.6 x 2.0 cm is now 3.3 x 2.2 cm on image 15/3. Pneumobilia suggests prior sphincterotomy with common bile duct dilated up to 13 mm in the head of the pancreas. Pancreas: 3.8 x 2.9 cm necrotic lesion in the tail of the pancreas is new in the interval and invades the splenic vein (axial image 32/3 and sagittal image 92/7). Spleen: No splenomegaly. No suspicious focal mass lesion. Adrenals/Urinary Tract: No adrenal nodule or mass. Segmental hypoperfusion identified posterior right kidney (images 12 and 14 of series 8) are similar to the prior study interval stability is more suggestive of scarring been acute etiology such as pyelonephritis. Central sinus cysts are noted in the left kidney No evidence for hydroureter. The urinary bladder appears normal for the degree of distention. Stomach/Bowel: Stomach is nondistended although the gastric wall is ill-defined proximally. The posterior left hepatic lobe lesion is contiguous with the wall of the proximal stomach, obscuring the intervening fat plane. Duodenum is normally positioned as is the ligament of Treitz. Retroperitoneal edema surrounds the transverse duodenum. Duodenal diverticulum noted. No small bowel wall thickening. No small bowel dilatation. The terminal ileum is normal. The appendix is not well visualized, but there is no edema or inflammation in the region of the cecal tip to suggest appendicitis. No gross colonic mass. No colonic wall thickening. Diverticular changes are noted in the left colon without evidence of diverticulitis. Vascular/Lymphatic: There is advanced atherosclerotic calcification of the abdominal aorta without aneurysm. Neither the  left femoral artery nor the left profunda stent device are opacified, similar to prior and suggesting chronic occlusion. Calcified retroperitoneal/para-aortic lymph nodes again  noted, similar to prior. Lymph node in the region of the gastrohepatic ligament measured previously at 3.5 x 3.3 cm is 2.9 x 2.4 cm today on image 27/3. Low-density lesion between the lateral segment left liver and the esophagogastric junction was previously measured at 5.5 x 4.8 cm which compares to 5.7 x 5.6 cm today on image 17/3. Portal vein and superior mesenteric vein are patent. As noted above, the new pancreatic tail lesion invades the splenic vein which remains patent. No pelvic sidewall lymphadenopathy. Reproductive: The prostate gland and seminal vesicles are unremarkable. Scrotal edema evident. Other: Small to moderate volume free fluid is seen around the liver and spleen with fluid in the para colic gutters bilaterally and small volume free fluid in the pelvis. Musculoskeletal: No worrisome lytic or sclerotic osseous abnormality. Status post lumbosacral fusion. IMPRESSION: 1. Interval progression of metastatic disease in the liver with interval enlargement of index lesions. 2. New 3.8 x 2.9 cm necrotic lesion in the tail of the pancreas with invasion of the splenic vein. Splenic vein remains patent at this time. 3. New small to moderate volume ascites. 4. Interval development of small right and small to moderate left pleural effusions with dependent collapse/consolidative opacity in both lung bases. 5. Upper abdominal lymphadenopathy is similar to prior. 6. 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 7.  Aortic Atherosclerosis (ICD10-I70.0). These results will be called to the ordering clinician or representative by the Radiologist Assistant, and communication documented in the PACS or [employer]. Electronically Signed   By: Kennith Center M.D.   On: 03/06/2023 08:19    Medications: I have reviewed the patient's current medications.  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 -02/11/2022 CTA chest-7.8 x 8.5 cm subtle hypoattenuating mass lesion in segment IV of the liver. -02/11/2022 CT abdomen/pelvis-8.7 x 7.8 or ill-defined irregular mass in the medial segment left liver, 4.5 x 3.4 cm necrotic mass lesion just cranial to the esophagogastric junction with metastatic lymphadenopathy in the abdomen and pelvis, irregular nodular peritoneal thickening in the lower abdomen and pelvis -02/11/2022 MRI abdomen-rim-enhancing likely necrotic mass or lymph node conglomerate centered around the gastroesophageal junction measuring 5.0 x 3.9 cm, large rim-enhancing likely necrotic liver mass centered in the anterior left lobe of the liver measuring 7.8 x 7.2 cm, enlarged gastrohepatic ligament and retroperitoneal lymph nodes -Labs from 02/11/2022-AFP 2.4, CA 19.9 88, CEA 287 -Ultrasound-guided liver biopsy performed 02/15/2022-adenocarcinoma with extensive necrosis, CK7 positive, negative for CK20, CDX2, TTF-1, and PAX8, consistent with a pancreatic, lung, or upper gastrointestinal primary -PET 03/08/2022-hypermetabolic distal esophagus mass, hypermetabolic centrally necrotic hepatic mass, hypermetabolic periportal nodes, exophytic GE junction mass without hypermetabolism-likely benign lesion, no hypermetabolism in the pancreas head, bilateral hypermetabolic adrenal gland lesions, small hypermetabolic left parotid gland lesion.  No loss of mismatch repair protein expression, HER2 3+ -CTs 03/11/2022-no change from PET 03/08/2022, known distal esophagus mass-not visualized on unenhanced CT, large hepatic metastasis and upper abdominal nodal metastases, chronic pancreatitis with 4.3 cm pseudocyst in the pancreas head -Cycle 1 FOLFOX, trastuzumab, Pembrolizumab 04/06/2022 -Cycle 2 oxaliplatin, 5-FU bolus/pump and trastuzumab held due to hospitalization following cycle 1 with possible coronary vasospasm -Pembrolizumab 04/25/2022 -CT  abdomen/pelvis 05/02/2022-increased peripancreatic edema, increased size of pancreas head pseudocyst, increased  necrotic nodal masses at the GE junction -Cycle 3 oxaliplatin, trastuzumab, pembrolizumab 05/15/2022; 5-FU held secondary to cardiac toxicity following cycle 1 -Cycle 4 oxaliplatin, trastuzumab 05/29/2022, 5-FU held secondary to cardiac toxicity -Cycle 5 oxaliplatin, trastuzumab, Pembrolizumab (6-week dosing for Pembrolizumab) 06/12/2022, 5-FU held secondary to cardiac toxicity -Cycle 6 oxaliplatin, trastuzumab 06/28/2022, 5-FU held -Cycle 7 oxaliplatin, trastuzumab 07/17/2022, 5-FU held -CTs 07/19/2022-decrease in the dominant anterior liver mass, 2 new small liver lesions compared to 05/02/2022, new right lower lobe nodule, enlargement of epicardial lymph nodes, slight decrease in size of celiac axis, gastropathic ligament, and portacaval lymphadenopathy, unchanged esophageal thickening -Cycle 8 oxaliplatin and pembrolizumab 08/14/2022, 5-FU and trastuzumab held secondary to potential cardiac toxicity -CTs 09/12/2022-increased number hepatic metastases, few are larger; multiple abnormal lymph nodes identified in the abdomen and lower mediastinum, relatively similar; increased stranding along the mesentery and retroperitoneum; persistent wall thickening of the duodenum with slight ectasia of the lumen; persistent left hepatic lobe biliary duct dilatation, likely compressive from adjacent metastatic lesions; previous 6 mm right lower lobe lung nodule obscured by motion. -Cycle 1 Taxol/cyramza 09/20/2022 -Cycle 2 Taxol/Cyramza 10/04/2022 -Cycle 3 Taxol/Cyramza 10/19/2022 (Taxol dose reduced secondary to thrombocytopenia) -Cycle 4 Taxol/cyramza 11/16/2022 -Cycle 5 Taxol/Cyramza 11/30/2022 -CT chest emergency department 12/17/2022 due to complaint of dyspnea-no enlarged mediastinal, hilar or axillary lymph nodes.  New 7 mm lung nodule seen within the superior segment right lower lobe.  Additional 9 mm posterior  right lower lobe lung nodule, previously measured 5 mm on prior study.  Small bilateral pleural effusions.  Multiple liver masses of various sizes, present on the prior exam.  4.6 x 3.7 low-attenuation extending from the posterior aspect of the left lobe of the liver to the region of the gastric fundus, previously measured 4.8 x 3.9 cm -Cycle 6 Taxol/Cyramza 01/08/2023 -CT chest 02/28/2023-slightly less conspicuous right upper lobe peripheral groundglass opacities, likely increase in size of multiple poorly defined hepatic masses, increase in size of a gastroesophageal/gastric fundus mass -CT abdomen/pelvis 03/05/2023, compared to 09/12/2022-enlargement of metastatic liver lesions, "new "pancreas tail mass with invasion of the splenic vein (present on 12/17/2022 chest CT by my review), new small to moderate volume ascites, stable upper abdominal adenopathy   2.  Myocardial infarction/cardiac arrest 01/31/2022 -Echocardiogram 02/01/2022-LVEF 60-65%, right ventricle severely dilated with hypokinesis in the basal and midportion, hypokinesis in the apical portion, right ventricular systolic function moderately reduced -Echocardiogram on 05/01/2022, LVEF 50-55%, decreased right ventricular systolic function, moderately enlarged right ventricle -Echocardiogram 08/05/2022-LVEF 45-50%, mildly decreased left ventricular function, moderate reduced right ventricular function 3.  Microcytic anemia -Labs from 02/11/2022-ferritin 151, iron 13, percent saturation 5% 4.  DVT/pulmonary embolism-bilateral lower extremity DVTs 01/10/2022, pulmonary emboli on chest CT 01/31/2022-apixaban 5.  PAD status post AKA in 2021 6.  RA 7.  Bilateral pleural effusions -Ultrasound-guided thoracentesis performed on 02/11/2022.  Cytology negative for malignancy. 8.  COPD 9.  Admission 03/11/2022 with E. coli bacteremia Ultrasound-guided biopsy/aspiration of dominant necrotic appearing liver lesion 03/17/2022-scant fluid aspirated-negative culture, no  organisms or white cells.  Pathology with necrotic tissue. 10.  Admission 04/29/2022 after a fall, multiple skull fractures 11.  Fever, hypotension-Klebsiella bacteremia on blood culture 04/30/2022 12.  Admission 04/09/2022 with chest pain, NSTEMI 13.  Admission 08/04/2022 with ventricular tachycardia, status post cardioversion, noted to have hypokalemia and hypomagnesemia, started on amiodarone 14.  Rash following cycle 3 Taxol/ramucirumab-admitted to [facility] due to concern for SJS.  SJS and disseminated zoster ruled out. 15.  Admission 12/27/2022 with sepsis syndrome, probable aspiration pneumonia  16.  Admission 02/28/2023 with sepsis syndrome, blood culture positive for Klebsiella    [name] was admitted with Klebsiella sepsis.  His clinical status appears to have returned to baseline.  He has metastatic adenocarcinoma.  I discussed the 03/05/2023 CT findings with him.  There appears to be progression in size of the liver lesions compared to a CT abdomen/pelvis from March and there is a lesion in the pancreas tail.  By my review the lesion in the pancreas was present in June.  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  It is possible the pancreas is the primary tumor site.  We will submit the August 2023 liver biopsy tissue for a gene array assay to assess for the most likely primary tumor site.  [name] indicated he would like to continue treatment of the cancer.  He understands no therapy will be curative.  Treatment options are limited by his comorbid conditions and performance status.  We will arrange for outpatient follow-up. [name]   Recommendations: Complete course of antibiotics per the medical service Outpatient follow-up at the cancer center Please call oncology as needed    LOS: 7 days   Thornton Papas, MD   03/07/2023, 6:36 AM

## 2023-03-08 ENCOUNTER — Telehealth: Payer: Self-pay

## 2023-03-08 NOTE — Telephone Encounter (Signed)
[name] [name] with [facility] want to informed Dr. Geraldo Pitter that they're following [name] with Palliative care services.

## 2023-03-08 NOTE — Transitions of Care (Post Inpatient/ED Visit) (Signed)
   03/08/2023  Name: [name] [name] MRN: [id number] DOB: August 20, 1960  Today's TOC FU Call Status: Today's TOC FU Call Status:: Unsuccessful Call (1st Attempt) Unsuccessful Call (1st Attempt) Date: 03/08/23  Attempted to reach the patient regarding the most recent Inpatient/ED visit.  Follow Up Plan: Additional outreach attempts will be made to reach the patient to complete the Transitions of Care (Post Inpatient/ED visit) call.   Jodelle Gross RN, BSN, CCM [facility] RN Care Coordinator/ Transitions of Care Direct Dial: [phone]  Fax: [phone]

## 2023-03-09 ENCOUNTER — Telehealth: Payer: Self-pay

## 2023-03-09 ENCOUNTER — Telehealth: Payer: Self-pay | Admitting: *Deleted

## 2023-03-09 NOTE — Telephone Encounter (Signed)
Call from [place], OT  [facility]. Went out to see patient today.  Would like to start OT on Monday.

## 2023-03-09 NOTE — Patient Outreach (Signed)
[facility] Care Coordination Follow Up  03/09/2023 Name:  [name] [name] MRN:  [id number] DOB:  08-27-1960  Message left on Richelle Ito, patients HCPOA, that this writer left a message on [employer]'s voicemail inquiring about the hoyer lift and when the patient would receive it.  Received permission from my superviser, Marja Kays to have the patients new Advance Directive form printed and mailed to the patient.  Provided return call number in case patient has questions/concerns. Jodelle Gross RN, BSN, CCM [facility] RN Care Coordinator/ Transitions of Care Direct Dial: [phone]  Fax: [phone]

## 2023-03-09 NOTE — Transitions of Care (Post Inpatient/ED Visit) (Signed)
03/09/2023  Name: [name] [name] MRN: [id number] DOB: [birth year]  Today's TOC FU Call Status: Today's TOC FU Call Status:: Successful TOC FU Call Completed TOC FU Call Complete Date: 03/09/23 Patient's Name and Date of Birth confirmed.  Transition Care Management Follow-up Telephone Call Date of Discharge: 03/07/23 Discharge Facility: [facility]) Type of Discharge: Inpatient Admission Primary Inpatient Discharge Diagnosis:: Septic Shock How have you been since you were released from the hospital?: Better (Per patients caregiver, [name] [name]) Any questions or concerns?: Yes Patient Questions/Concerns:: [name] [name] was suppose to receive a copy of the ADHPOA he filled out at the hospital upon dc.  He did not receive copy Patient Questions/Concerns Addressed: Other: (Reached out to supervisor, [name] [name] for direction on getting patient his copy)  Items Reviewed: Did you receive and understand the discharge instructions provided?: Yes Medications obtained,verified, and reconciled?: Yes (Medications Reviewed) Any new allergies since your discharge?: No Dietary orders reviewed?: No Do you have support at home?: Yes People in Home: friend(s) Name of Support/Comfort Primary Source: [name] [name]  Medications Reviewed Today: Medications Reviewed Today     Reviewed by [name] Gross, RN (Case Manager) on 03/09/23 at 1148  Med List Status: <None>   Medication Order Taking? Sig Documenting Provider Last Dose Status Informant  albuterol (VENTOLIN HFA) 108 (90 Base) MCG/ACT inhaler [id number] Yes INHALE 2 PUFFS INTO THE LUNGS EVERY 6 (SIX) HOURS AS NEEDED FOR WHEEZING OR SHORTNESS OF BREATH. [name] Artist, MD Taking Active Friend  amiodarone (PACERONE) 200 MG tablet [id number] Yes Take 1 tablet (200 mg total) by mouth 2 (two) times daily. [name] Bene, MD Taking Active Friend  apixaban (ELIQUIS) 5 MG TABS tablet [id number] Yes TAKE 1 TABLET (5 MG TOTAL) BY MOUTH 2 (TWO) TIMES DAILY. [name] Morel,  DO Taking Active Friend  atorvastatin (LIPITOR) 40 MG tablet [id number]  Take 1 tablet (40 mg total) by mouth daily at 6 PM. [name] Morel, DO  Expired 02/28/23 2359 Friend  clopidogrel (PLAVIX) 75 MG tablet [id number] Yes Take 75 mg by mouth daily. [provider] Taking Active Friend  diclofenac Sodium (VOLTAREN) 1 % GEL [id number] Yes Apply 2 g topically 4 (four) times daily. Carrion-Carrero, [name] Starch, MD Taking Active Friend  [name] [name] (CYMBALTA) 30 MG capsule [id number] Yes Take 30 mg by mouth daily as needed (sleep). [provider] Taking Active Friend           Med Note [name] [name], Dyane Dustman Dec 31, 2022  4:11 PM)    feeding supplement (ENSURE ENLIVE / ENSURE PLUS) LIQD [id number] Yes Take 237 mLs by mouth 3 (three) times daily between meals. Lorri Frederick, MD Taking Active Friend           Med Note [name] [name], Dyane Dustman Dec 31, 2022  4:49 PM)    ferrous sulfate 325 (65 FE) MG tablet [id number] Yes Take 1 tablet (325 mg total) by mouth daily with breakfast for 100 doses. [name] Morel, DO Taking Active Friend  fluticasone-salmeterol (ADVAIR HFA) [phone] MCG/ACT inhaler [id number] Yes Inhale 2 puffs into the lungs 2 (two) times daily. [name] Bene, MD Taking Active Friend  folic acid (FOLVITE) 1 MG tablet [id number] Yes Take 1 mg by mouth daily. [provider] Taking Active Friend  gabapentin (NEURONTIN) 300 MG capsule [id number] Yes Take 1 capsule (300 mg total) by mouth 2 (two) times daily. Daily or twice daily if tolerated. [name] Morel, DO Taking Active Friend  hydrOXYzine (ATARAX) 25 MG tablet [id number] Yes Take 25  mg by mouth at bedtime. [provider] Taking Active Friend  lidocaine-prilocaine (EMLA) cream [id number] No Apply 1 Application topically as directed. Apply to port site 2 hours prior to stick and cover with Press-and-Seal to numb port before access [name] Artist, MD Unknown Active Friend           Med Note [name] [name], [name] [name]   Sun  Dec 31, 2022  4:49 PM)    loperamide (IMODIUM A-D) 2 MG tablet [id number] No Take 2 mg by mouth 4 (four) times daily as needed for diarrhea or loose stools. [provider] Unknown Active Friend  meloxicam (MOBIC) 7.5 MG tablet [id number] Yes Take 1 tablet (7.5 mg total) by mouth daily as needed for pain. [name] Morel, DO Taking Active [name] [name]  [facility] powder [id number] Yes Apply 1 Application topically 2 (two) times daily. [provider] Taking Active Friend  ondansetron (ZOFRAN) 8 MG tablet [id number] Yes START TAKING 72 HOURS AFTER CHEMOTHERAPY TREATMENT.  Patient taking differently: Take 8 mg by mouth every 8 (eight) hours as needed for vomiting or nausea.   [name] Artist, MD Taking Active Friend  Oxycodone HCl 10 MG TABS [id number] Yes Take 10 mg by mouth every 6 (six) hours. [provider] Taking Active Friend  polyethylene glycol (MIRALAX / GLYCOLAX) 17 g packet [id number] Yes Take 17 g by mouth daily as needed for moderate constipation. [name] Miller, DO Taking Active   potassium chloride (KLOR-CON) 10 MEQ tablet [id number] Yes TAKE 2 TABLETS(20 MEQ) BY MOUTH DAILY [name] Artist, MD Taking Active Friend  prochlorperazine (COMPAZINE) 10 MG tablet [id number] Yes TAKE 1 TABLET (10 MG TOTAL) BY MOUTH EVERY 6 (SIX) HOURS AS NEEDED. [name] Artist, MD Taking Active Friend            Home Care and Equipment/Supplies: Were Home Health Services Ordered?: Yes Name of Home Health Agency:: [facility] Has Agency set up a time to come to your home?: Yes First Home Health Visit Date: 03/09/23 Any new equipment or medical supplies ordered?: Yes Name of Medical supply agency?: [employer] Were you able to get the equipment/medical supplies?: No (This writer called [employer] and left message for return call) Do you have any questions related to the use of the equipment/supplies?: No  Functional Questionnaire: Do you need assistance with bathing/showering or dressing?:  Yes Do you need assistance with meal preparation?: Yes Do you need assistance with eating?: No Do you have difficulty maintaining continence: No Do you need assistance with getting out of bed/getting out of a chair/moving?: Yes Do you have difficulty managing or taking your medications?: Yes  Follow up appointments reviewed: PCP Follow-up appointment confirmed?: No MD Provider Line Number:[phone] Given: No (Caregiver to set up appt) Specialist Hospital Follow-up appointment confirmed?: Yes Date of Specialist follow-up appointment?: 03/23/23 Follow-Up Specialty Provider:: Joni Reining, Cardiology Do you need transportation to your follow-up appointment?: No Do you understand care options if your condition(s) worsen?: Yes-patient verbalized understanding  SDOH Interventions Today    Flowsheet Row Most Recent Value  SDOH Interventions   Food Insecurity Interventions Intervention Not Indicated  Housing Interventions Intervention Not Indicated  Utilities Interventions Intervention Not Indicated      TOC Interventions Today    Flowsheet Row Most Recent Value  TOC Interventions   TOC Interventions Discussed/Reviewed TOC Interventions Discussed, TOC Interventions Reviewed, Contact DME company for patient use of equipment     Plan to call patient back once return call from [employer] and information on Proofreader.  [name] Gross RN, BSN, CCM [facility] RN Care Coordinator/ Transitions of Care Direct Dial: [phone]  Fax: [phone]

## 2023-03-12 DIAGNOSIS — J449 Chronic obstructive pulmonary disease, unspecified: Secondary | ICD-10-CM | POA: Diagnosis not present

## 2023-03-12 DIAGNOSIS — J690 Pneumonitis due to inhalation of food and vomit: Secondary | ICD-10-CM | POA: Diagnosis not present

## 2023-03-12 DIAGNOSIS — C159 Malignant neoplasm of esophagus, unspecified: Secondary | ICD-10-CM | POA: Diagnosis not present

## 2023-03-12 DIAGNOSIS — C7800 Secondary malignant neoplasm of unspecified lung: Secondary | ICD-10-CM | POA: Diagnosis not present

## 2023-03-12 DIAGNOSIS — R918 Other nonspecific abnormal finding of lung field: Secondary | ICD-10-CM | POA: Diagnosis not present

## 2023-03-12 DIAGNOSIS — I739 Peripheral vascular disease, unspecified: Secondary | ICD-10-CM | POA: Diagnosis not present

## 2023-03-12 DIAGNOSIS — I959 Hypotension, unspecified: Secondary | ICD-10-CM | POA: Diagnosis not present

## 2023-03-12 DIAGNOSIS — G894 Chronic pain syndrome: Secondary | ICD-10-CM | POA: Diagnosis not present

## 2023-03-12 DIAGNOSIS — C787 Secondary malignant neoplasm of liver and intrahepatic bile duct: Secondary | ICD-10-CM | POA: Diagnosis not present

## 2023-03-12 DIAGNOSIS — M069 Rheumatoid arthritis, unspecified: Secondary | ICD-10-CM | POA: Diagnosis not present

## 2023-03-12 DIAGNOSIS — N179 Acute kidney failure, unspecified: Secondary | ICD-10-CM | POA: Diagnosis not present

## 2023-03-12 DIAGNOSIS — M5137 Other intervertebral disc degeneration, lumbosacral region: Secondary | ICD-10-CM | POA: Diagnosis not present

## 2023-03-12 DIAGNOSIS — E872 Acidosis, unspecified: Secondary | ICD-10-CM | POA: Diagnosis not present

## 2023-03-12 DIAGNOSIS — I493 Ventricular premature depolarization: Secondary | ICD-10-CM | POA: Diagnosis not present

## 2023-03-12 DIAGNOSIS — J439 Emphysema, unspecified: Secondary | ICD-10-CM | POA: Diagnosis not present

## 2023-03-12 DIAGNOSIS — M1990 Unspecified osteoarthritis, unspecified site: Secondary | ICD-10-CM | POA: Diagnosis not present

## 2023-03-12 DIAGNOSIS — E43 Unspecified severe protein-calorie malnutrition: Secondary | ICD-10-CM | POA: Diagnosis not present

## 2023-03-12 DIAGNOSIS — I252 Old myocardial infarction: Secondary | ICD-10-CM | POA: Diagnosis not present

## 2023-03-12 DIAGNOSIS — I2510 Atherosclerotic heart disease of native coronary artery without angina pectoris: Secondary | ICD-10-CM | POA: Diagnosis not present

## 2023-03-12 DIAGNOSIS — K219 Gastro-esophageal reflux disease without esophagitis: Secondary | ICD-10-CM | POA: Diagnosis not present

## 2023-03-12 DIAGNOSIS — E785 Hyperlipidemia, unspecified: Secondary | ICD-10-CM | POA: Diagnosis not present

## 2023-03-12 DIAGNOSIS — I10 Essential (primary) hypertension: Secondary | ICD-10-CM | POA: Diagnosis not present

## 2023-03-12 DIAGNOSIS — D630 Anemia in neoplastic disease: Secondary | ICD-10-CM | POA: Diagnosis not present

## 2023-03-12 DIAGNOSIS — G931 Anoxic brain damage, not elsewhere classified: Secondary | ICD-10-CM | POA: Diagnosis not present

## 2023-03-13 ENCOUNTER — Other Ambulatory Visit: Payer: Self-pay

## 2023-03-14 DIAGNOSIS — J439 Emphysema, unspecified: Secondary | ICD-10-CM | POA: Diagnosis not present

## 2023-03-14 DIAGNOSIS — I739 Peripheral vascular disease, unspecified: Secondary | ICD-10-CM | POA: Diagnosis not present

## 2023-03-14 DIAGNOSIS — I471 Supraventricular tachycardia, unspecified: Secondary | ICD-10-CM | POA: Diagnosis not present

## 2023-03-14 DIAGNOSIS — M069 Rheumatoid arthritis, unspecified: Secondary | ICD-10-CM | POA: Diagnosis not present

## 2023-03-14 DIAGNOSIS — J449 Chronic obstructive pulmonary disease, unspecified: Secondary | ICD-10-CM | POA: Diagnosis not present

## 2023-03-15 ENCOUNTER — Inpatient Hospital Stay

## 2023-03-15 ENCOUNTER — Telehealth: Payer: Self-pay | Admitting: *Deleted

## 2023-03-15 ENCOUNTER — Inpatient Hospital Stay: Admitting: Nurse Practitioner

## 2023-03-15 DIAGNOSIS — E785 Hyperlipidemia, unspecified: Secondary | ICD-10-CM | POA: Diagnosis not present

## 2023-03-15 DIAGNOSIS — C787 Secondary malignant neoplasm of liver and intrahepatic bile duct: Secondary | ICD-10-CM | POA: Diagnosis not present

## 2023-03-15 DIAGNOSIS — I252 Old myocardial infarction: Secondary | ICD-10-CM | POA: Diagnosis not present

## 2023-03-15 DIAGNOSIS — R918 Other nonspecific abnormal finding of lung field: Secondary | ICD-10-CM | POA: Diagnosis not present

## 2023-03-15 DIAGNOSIS — M069 Rheumatoid arthritis, unspecified: Secondary | ICD-10-CM | POA: Diagnosis not present

## 2023-03-15 DIAGNOSIS — C160 Malignant neoplasm of cardia: Secondary | ICD-10-CM | POA: Diagnosis not present

## 2023-03-15 DIAGNOSIS — C7800 Secondary malignant neoplasm of unspecified lung: Secondary | ICD-10-CM | POA: Diagnosis not present

## 2023-03-15 DIAGNOSIS — D630 Anemia in neoplastic disease: Secondary | ICD-10-CM | POA: Diagnosis not present

## 2023-03-15 DIAGNOSIS — E43 Unspecified severe protein-calorie malnutrition: Secondary | ICD-10-CM | POA: Diagnosis not present

## 2023-03-15 DIAGNOSIS — I739 Peripheral vascular disease, unspecified: Secondary | ICD-10-CM | POA: Diagnosis not present

## 2023-03-15 DIAGNOSIS — Z79899 Other long term (current) drug therapy: Secondary | ICD-10-CM | POA: Diagnosis not present

## 2023-03-15 DIAGNOSIS — G4701 Insomnia due to medical condition: Secondary | ICD-10-CM | POA: Diagnosis not present

## 2023-03-15 DIAGNOSIS — I959 Hypotension, unspecified: Secondary | ICD-10-CM | POA: Diagnosis not present

## 2023-03-15 DIAGNOSIS — J439 Emphysema, unspecified: Secondary | ICD-10-CM | POA: Diagnosis not present

## 2023-03-15 DIAGNOSIS — K219 Gastro-esophageal reflux disease without esophagitis: Secondary | ICD-10-CM | POA: Diagnosis not present

## 2023-03-15 DIAGNOSIS — N179 Acute kidney failure, unspecified: Secondary | ICD-10-CM | POA: Diagnosis not present

## 2023-03-15 DIAGNOSIS — I10 Essential (primary) hypertension: Secondary | ICD-10-CM | POA: Diagnosis not present

## 2023-03-15 DIAGNOSIS — J449 Chronic obstructive pulmonary disease, unspecified: Secondary | ICD-10-CM | POA: Diagnosis not present

## 2023-03-15 DIAGNOSIS — C159 Malignant neoplasm of esophagus, unspecified: Secondary | ICD-10-CM | POA: Diagnosis not present

## 2023-03-15 DIAGNOSIS — E872 Acidosis, unspecified: Secondary | ICD-10-CM | POA: Diagnosis not present

## 2023-03-15 DIAGNOSIS — B372 Candidiasis of skin and nail: Secondary | ICD-10-CM | POA: Diagnosis not present

## 2023-03-15 DIAGNOSIS — I493 Ventricular premature depolarization: Secondary | ICD-10-CM | POA: Diagnosis not present

## 2023-03-15 DIAGNOSIS — M47817 Spondylosis without myelopathy or radiculopathy, lumbosacral region: Secondary | ICD-10-CM | POA: Diagnosis not present

## 2023-03-15 DIAGNOSIS — J690 Pneumonitis due to inhalation of food and vomit: Secondary | ICD-10-CM | POA: Diagnosis not present

## 2023-03-15 DIAGNOSIS — M5137 Other intervertebral disc degeneration, lumbosacral region: Secondary | ICD-10-CM | POA: Diagnosis not present

## 2023-03-15 DIAGNOSIS — C229 Malignant neoplasm of liver, not specified as primary or secondary: Secondary | ICD-10-CM | POA: Diagnosis not present

## 2023-03-15 DIAGNOSIS — G894 Chronic pain syndrome: Secondary | ICD-10-CM | POA: Diagnosis not present

## 2023-03-15 DIAGNOSIS — F17210 Nicotine dependence, cigarettes, uncomplicated: Secondary | ICD-10-CM | POA: Diagnosis not present

## 2023-03-15 DIAGNOSIS — G931 Anoxic brain damage, not elsewhere classified: Secondary | ICD-10-CM | POA: Diagnosis not present

## 2023-03-15 DIAGNOSIS — I2510 Atherosclerotic heart disease of native coronary artery without angina pectoris: Secondary | ICD-10-CM | POA: Diagnosis not present

## 2023-03-15 DIAGNOSIS — M1990 Unspecified osteoarthritis, unspecified site: Secondary | ICD-10-CM | POA: Diagnosis not present

## 2023-03-15 NOTE — Telephone Encounter (Signed)
Call from [facility], [facility].  Ot 1 time a week for 1 wee. 2 times a week for 2 weeks and 1 time a week for 1 week.  Bed mobility.  Upper body Strengthening .  Strengthening of for Self Care Task.  Approval was given .  Will forward to doctor for approval or denial

## 2023-03-16 ENCOUNTER — Other Ambulatory Visit: Payer: Self-pay | Admitting: Student

## 2023-03-16 DIAGNOSIS — G894 Chronic pain syndrome: Secondary | ICD-10-CM | POA: Diagnosis not present

## 2023-03-16 DIAGNOSIS — N179 Acute kidney failure, unspecified: Secondary | ICD-10-CM | POA: Diagnosis not present

## 2023-03-16 DIAGNOSIS — J690 Pneumonitis due to inhalation of food and vomit: Secondary | ICD-10-CM | POA: Diagnosis not present

## 2023-03-16 DIAGNOSIS — M1990 Unspecified osteoarthritis, unspecified site: Secondary | ICD-10-CM | POA: Diagnosis not present

## 2023-03-16 DIAGNOSIS — I252 Old myocardial infarction: Secondary | ICD-10-CM | POA: Diagnosis not present

## 2023-03-16 DIAGNOSIS — J449 Chronic obstructive pulmonary disease, unspecified: Secondary | ICD-10-CM | POA: Diagnosis not present

## 2023-03-16 DIAGNOSIS — E785 Hyperlipidemia, unspecified: Secondary | ICD-10-CM | POA: Diagnosis not present

## 2023-03-16 DIAGNOSIS — E872 Acidosis, unspecified: Secondary | ICD-10-CM | POA: Diagnosis not present

## 2023-03-16 DIAGNOSIS — I2510 Atherosclerotic heart disease of native coronary artery without angina pectoris: Secondary | ICD-10-CM | POA: Diagnosis not present

## 2023-03-16 DIAGNOSIS — D630 Anemia in neoplastic disease: Secondary | ICD-10-CM | POA: Diagnosis not present

## 2023-03-16 DIAGNOSIS — I10 Essential (primary) hypertension: Secondary | ICD-10-CM | POA: Diagnosis not present

## 2023-03-16 DIAGNOSIS — E43 Unspecified severe protein-calorie malnutrition: Secondary | ICD-10-CM | POA: Diagnosis not present

## 2023-03-16 DIAGNOSIS — C787 Secondary malignant neoplasm of liver and intrahepatic bile duct: Secondary | ICD-10-CM | POA: Diagnosis not present

## 2023-03-16 DIAGNOSIS — I959 Hypotension, unspecified: Secondary | ICD-10-CM | POA: Diagnosis not present

## 2023-03-16 DIAGNOSIS — M5137 Other intervertebral disc degeneration, lumbosacral region: Secondary | ICD-10-CM | POA: Diagnosis not present

## 2023-03-16 DIAGNOSIS — I493 Ventricular premature depolarization: Secondary | ICD-10-CM | POA: Diagnosis not present

## 2023-03-16 DIAGNOSIS — C159 Malignant neoplasm of esophagus, unspecified: Secondary | ICD-10-CM | POA: Diagnosis not present

## 2023-03-16 DIAGNOSIS — R918 Other nonspecific abnormal finding of lung field: Secondary | ICD-10-CM | POA: Diagnosis not present

## 2023-03-16 DIAGNOSIS — M069 Rheumatoid arthritis, unspecified: Secondary | ICD-10-CM | POA: Diagnosis not present

## 2023-03-16 DIAGNOSIS — K219 Gastro-esophageal reflux disease without esophagitis: Secondary | ICD-10-CM | POA: Diagnosis not present

## 2023-03-16 DIAGNOSIS — C7800 Secondary malignant neoplasm of unspecified lung: Secondary | ICD-10-CM | POA: Diagnosis not present

## 2023-03-16 DIAGNOSIS — G931 Anoxic brain damage, not elsewhere classified: Secondary | ICD-10-CM | POA: Diagnosis not present

## 2023-03-16 DIAGNOSIS — I739 Peripheral vascular disease, unspecified: Secondary | ICD-10-CM | POA: Diagnosis not present

## 2023-03-16 DIAGNOSIS — J439 Emphysema, unspecified: Secondary | ICD-10-CM | POA: Diagnosis not present

## 2023-03-19 ENCOUNTER — Telehealth: Payer: Self-pay | Admitting: *Deleted

## 2023-03-19 DIAGNOSIS — I959 Hypotension, unspecified: Secondary | ICD-10-CM | POA: Diagnosis not present

## 2023-03-19 DIAGNOSIS — I10 Essential (primary) hypertension: Secondary | ICD-10-CM | POA: Diagnosis not present

## 2023-03-19 DIAGNOSIS — M1990 Unspecified osteoarthritis, unspecified site: Secondary | ICD-10-CM | POA: Diagnosis not present

## 2023-03-19 DIAGNOSIS — N179 Acute kidney failure, unspecified: Secondary | ICD-10-CM | POA: Diagnosis not present

## 2023-03-19 DIAGNOSIS — E872 Acidosis, unspecified: Secondary | ICD-10-CM | POA: Diagnosis not present

## 2023-03-19 DIAGNOSIS — I739 Peripheral vascular disease, unspecified: Secondary | ICD-10-CM | POA: Diagnosis not present

## 2023-03-19 DIAGNOSIS — C7800 Secondary malignant neoplasm of unspecified lung: Secondary | ICD-10-CM | POA: Diagnosis not present

## 2023-03-19 DIAGNOSIS — E785 Hyperlipidemia, unspecified: Secondary | ICD-10-CM | POA: Diagnosis not present

## 2023-03-19 DIAGNOSIS — I252 Old myocardial infarction: Secondary | ICD-10-CM | POA: Diagnosis not present

## 2023-03-19 DIAGNOSIS — M069 Rheumatoid arthritis, unspecified: Secondary | ICD-10-CM | POA: Diagnosis not present

## 2023-03-19 DIAGNOSIS — J449 Chronic obstructive pulmonary disease, unspecified: Secondary | ICD-10-CM | POA: Diagnosis not present

## 2023-03-19 DIAGNOSIS — K219 Gastro-esophageal reflux disease without esophagitis: Secondary | ICD-10-CM | POA: Diagnosis not present

## 2023-03-19 DIAGNOSIS — J439 Emphysema, unspecified: Secondary | ICD-10-CM | POA: Diagnosis not present

## 2023-03-19 DIAGNOSIS — I493 Ventricular premature depolarization: Secondary | ICD-10-CM | POA: Diagnosis not present

## 2023-03-19 DIAGNOSIS — D630 Anemia in neoplastic disease: Secondary | ICD-10-CM | POA: Diagnosis not present

## 2023-03-19 DIAGNOSIS — C159 Malignant neoplasm of esophagus, unspecified: Secondary | ICD-10-CM | POA: Diagnosis not present

## 2023-03-19 DIAGNOSIS — M5137 Other intervertebral disc degeneration, lumbosacral region: Secondary | ICD-10-CM | POA: Diagnosis not present

## 2023-03-19 DIAGNOSIS — I2510 Atherosclerotic heart disease of native coronary artery without angina pectoris: Secondary | ICD-10-CM | POA: Diagnosis not present

## 2023-03-19 DIAGNOSIS — G931 Anoxic brain damage, not elsewhere classified: Secondary | ICD-10-CM | POA: Diagnosis not present

## 2023-03-19 DIAGNOSIS — J690 Pneumonitis due to inhalation of food and vomit: Secondary | ICD-10-CM | POA: Diagnosis not present

## 2023-03-19 DIAGNOSIS — E43 Unspecified severe protein-calorie malnutrition: Secondary | ICD-10-CM | POA: Diagnosis not present

## 2023-03-19 DIAGNOSIS — R918 Other nonspecific abnormal finding of lung field: Secondary | ICD-10-CM | POA: Diagnosis not present

## 2023-03-19 DIAGNOSIS — G894 Chronic pain syndrome: Secondary | ICD-10-CM | POA: Diagnosis not present

## 2023-03-19 DIAGNOSIS — C787 Secondary malignant neoplasm of liver and intrahepatic bile duct: Secondary | ICD-10-CM | POA: Diagnosis not present

## 2023-03-19 NOTE — Telephone Encounter (Signed)
Caregiver, [name] [name] reports had verbalized difficulty/painful swallowing. Could not eat pintos and cornbread last night. Asking if he should come in sooner than Friday? He is able to swallow liquids and all his medications. Pain med every 6 hours helps and he does not have a fever or any sign of URI. Informed caregiver to push fluids and soft foods and f/u on Friday as scheduled.

## 2023-03-20 ENCOUNTER — Other Ambulatory Visit: Payer: Self-pay | Admitting: Student

## 2023-03-20 ENCOUNTER — Telehealth: Payer: Self-pay | Admitting: *Deleted

## 2023-03-20 DIAGNOSIS — G894 Chronic pain syndrome: Secondary | ICD-10-CM

## 2023-03-20 NOTE — Telephone Encounter (Signed)
Call from Nurse from [facility] asking if patient has had a debridement of a Wound.  Patient has not been seen in the Clinics for a while is being followed at the [facility].  Gave Nurse number to Wound Care to contact the nurse there.

## 2023-03-21 ENCOUNTER — Telehealth: Payer: Self-pay | Admitting: *Deleted

## 2023-03-21 DIAGNOSIS — I739 Peripheral vascular disease, unspecified: Secondary | ICD-10-CM | POA: Diagnosis not present

## 2023-03-21 DIAGNOSIS — M5137 Other intervertebral disc degeneration, lumbosacral region: Secondary | ICD-10-CM | POA: Diagnosis not present

## 2023-03-21 DIAGNOSIS — J439 Emphysema, unspecified: Secondary | ICD-10-CM | POA: Diagnosis not present

## 2023-03-21 DIAGNOSIS — I10 Essential (primary) hypertension: Secondary | ICD-10-CM | POA: Diagnosis not present

## 2023-03-21 DIAGNOSIS — I252 Old myocardial infarction: Secondary | ICD-10-CM | POA: Diagnosis not present

## 2023-03-21 DIAGNOSIS — E872 Acidosis, unspecified: Secondary | ICD-10-CM | POA: Diagnosis not present

## 2023-03-21 DIAGNOSIS — K219 Gastro-esophageal reflux disease without esophagitis: Secondary | ICD-10-CM | POA: Diagnosis not present

## 2023-03-21 DIAGNOSIS — G931 Anoxic brain damage, not elsewhere classified: Secondary | ICD-10-CM | POA: Diagnosis not present

## 2023-03-21 DIAGNOSIS — C159 Malignant neoplasm of esophagus, unspecified: Secondary | ICD-10-CM | POA: Diagnosis not present

## 2023-03-21 DIAGNOSIS — J690 Pneumonitis due to inhalation of food and vomit: Secondary | ICD-10-CM | POA: Diagnosis not present

## 2023-03-21 DIAGNOSIS — E43 Unspecified severe protein-calorie malnutrition: Secondary | ICD-10-CM | POA: Diagnosis not present

## 2023-03-21 DIAGNOSIS — C787 Secondary malignant neoplasm of liver and intrahepatic bile duct: Secondary | ICD-10-CM | POA: Diagnosis not present

## 2023-03-21 DIAGNOSIS — G894 Chronic pain syndrome: Secondary | ICD-10-CM | POA: Diagnosis not present

## 2023-03-21 DIAGNOSIS — R918 Other nonspecific abnormal finding of lung field: Secondary | ICD-10-CM | POA: Diagnosis not present

## 2023-03-21 DIAGNOSIS — N179 Acute kidney failure, unspecified: Secondary | ICD-10-CM | POA: Diagnosis not present

## 2023-03-21 DIAGNOSIS — I959 Hypotension, unspecified: Secondary | ICD-10-CM | POA: Diagnosis not present

## 2023-03-21 DIAGNOSIS — M069 Rheumatoid arthritis, unspecified: Secondary | ICD-10-CM | POA: Diagnosis not present

## 2023-03-21 DIAGNOSIS — C7800 Secondary malignant neoplasm of unspecified lung: Secondary | ICD-10-CM | POA: Diagnosis not present

## 2023-03-21 DIAGNOSIS — E785 Hyperlipidemia, unspecified: Secondary | ICD-10-CM | POA: Diagnosis not present

## 2023-03-21 DIAGNOSIS — M1990 Unspecified osteoarthritis, unspecified site: Secondary | ICD-10-CM | POA: Diagnosis not present

## 2023-03-21 DIAGNOSIS — D630 Anemia in neoplastic disease: Secondary | ICD-10-CM | POA: Diagnosis not present

## 2023-03-21 DIAGNOSIS — L89311 Pressure ulcer of right buttock, stage 1: Secondary | ICD-10-CM | POA: Diagnosis not present

## 2023-03-21 DIAGNOSIS — I493 Ventricular premature depolarization: Secondary | ICD-10-CM | POA: Diagnosis not present

## 2023-03-21 DIAGNOSIS — I2510 Atherosclerotic heart disease of native coronary artery without angina pectoris: Secondary | ICD-10-CM | POA: Diagnosis not present

## 2023-03-21 DIAGNOSIS — J449 Chronic obstructive pulmonary disease, unspecified: Secondary | ICD-10-CM | POA: Diagnosis not present

## 2023-03-21 NOTE — Telephone Encounter (Signed)
Received a call from Jenna,ST with [facility] - stated they received an order to see the pt for an evaluation. She's calling to let us know they have not been able to reach the pt but they will continue at a later date.

## 2023-03-22 ENCOUNTER — Telehealth: Payer: Self-pay | Admitting: *Deleted

## 2023-03-22 ENCOUNTER — Other Ambulatory Visit: Payer: Self-pay

## 2023-03-22 NOTE — Telephone Encounter (Signed)
Call from [place] from [facility] [place] got a call from [facility] who had been providing care for patient about getting a referral to Hospice to go out and assess patient for Care.  [facility] has spoken to the patient's family and are in agreement for the services.  Verbal order given for [facility] [place] to go out to assess patient for services.  Will sent to PCP to get an agreement or denial for services.

## 2023-03-23 ENCOUNTER — Ambulatory Visit: Payer: 59 | Admitting: Adult Health

## 2023-03-23 ENCOUNTER — Other Ambulatory Visit: Payer: Self-pay

## 2023-03-23 ENCOUNTER — Inpatient Hospital Stay (HOSPITAL_BASED_OUTPATIENT_CLINIC_OR_DEPARTMENT_OTHER): Admitting: Nurse Practitioner

## 2023-03-23 ENCOUNTER — Encounter: Payer: Self-pay | Admitting: Nurse Practitioner

## 2023-03-23 ENCOUNTER — Inpatient Hospital Stay: Attending: Oncology

## 2023-03-23 ENCOUNTER — Inpatient Hospital Stay

## 2023-03-23 DIAGNOSIS — Z7901 Long term (current) use of anticoagulants: Secondary | ICD-10-CM | POA: Diagnosis not present

## 2023-03-23 DIAGNOSIS — D509 Iron deficiency anemia, unspecified: Secondary | ICD-10-CM | POA: Insufficient documentation

## 2023-03-23 DIAGNOSIS — C801 Malignant (primary) neoplasm, unspecified: Secondary | ICD-10-CM

## 2023-03-23 DIAGNOSIS — Z86718 Personal history of other venous thrombosis and embolism: Secondary | ICD-10-CM | POA: Diagnosis not present

## 2023-03-23 DIAGNOSIS — R970 Elevated carcinoembryonic antigen [CEA]: Secondary | ICD-10-CM | POA: Diagnosis not present

## 2023-03-23 DIAGNOSIS — C787 Secondary malignant neoplasm of liver and intrahepatic bile duct: Secondary | ICD-10-CM | POA: Insufficient documentation

## 2023-03-23 DIAGNOSIS — L89159 Pressure ulcer of sacral region, unspecified stage: Secondary | ICD-10-CM | POA: Insufficient documentation

## 2023-03-23 DIAGNOSIS — Z86711 Personal history of pulmonary embolism: Secondary | ICD-10-CM | POA: Insufficient documentation

## 2023-03-23 DIAGNOSIS — C159 Malignant neoplasm of esophagus, unspecified: Secondary | ICD-10-CM

## 2023-03-23 DIAGNOSIS — D696 Thrombocytopenia, unspecified: Secondary | ICD-10-CM | POA: Diagnosis not present

## 2023-03-23 LAB — CMP (CANCER CENTER ONLY)
ALT: 27 U/L (ref 0–44)
AST: 63 U/L — ABNORMAL HIGH (ref 15–41)
Albumin: 3.2 g/dL — ABNORMAL LOW (ref 3.5–5.0)
Alkaline Phosphatase: 507 U/L — ABNORMAL HIGH (ref 38–126)
Anion gap: 9 (ref 5–15)
BUN: 20 mg/dL (ref 8–23)
CO2: 23 mmol/L (ref 22–32)
Calcium: 9.1 mg/dL (ref 8.9–10.3)
Chloride: 105 mmol/L (ref 98–111)
Creatinine: 0.98 mg/dL (ref 0.61–1.24)
GFR, Estimated: >60 mL/min (ref >60)
Glucose, Bld: 134 mg/dL — ABNORMAL HIGH (ref 70–99)
Potassium: 4.2 mmol/L (ref 3.5–5.1)
Sodium: 137 mmol/L (ref 135–145)
Total Bilirubin: 0.7 mg/dL (ref 0.3–1.2)
Total Protein: 7.8 g/dL (ref 6.5–8.1)

## 2023-03-23 LAB — CBC WITH DIFFERENTIAL (CANCER CENTER ONLY)
Abs Immature Granulocytes: 0.02 10*3/uL (ref 0.00–0.07)
Basophils Absolute: 0.1 10*3/uL (ref 0.0–0.1)
Basophils Relative: 1 %
Eosinophils Absolute: 0.2 10*3/uL (ref 0.0–0.5)
Eosinophils Relative: 2 %
HCT: 37.2 % — ABNORMAL LOW (ref 39.0–52.0)
Hemoglobin: 11.7 g/dL — ABNORMAL LOW (ref 13.0–17.0)
Immature Granulocytes: 0 %
Lymphocytes Relative: 15 %
Lymphs Abs: 1.2 10*3/uL (ref 0.7–4.0)
MCH: 27.8 pg (ref 26.0–34.0)
MCHC: 31.5 g/dL (ref 30.0–36.0)
MCV: 88.4 fL (ref 80.0–100.0)
Monocytes Absolute: 0.6 10*3/uL (ref 0.1–1.0)
Monocytes Relative: 7 %
Neutro Abs: 6.2 10*3/uL (ref 1.7–7.7)
Neutrophils Relative %: 75 %
Platelet Count: 140 10*3/uL — ABNORMAL LOW (ref 150–400)
RBC: 4.21 MIL/uL — ABNORMAL LOW (ref 4.22–5.81)
RDW: 17.9 % — ABNORMAL HIGH (ref 11.5–15.5)
WBC Count: 8.3 10*3/uL (ref 4.0–10.5)
nRBC: 0 % (ref 0.0–0.2)

## 2023-03-23 LAB — CEA (ACCESS): CEA (CHCC): 487.05 ng/mL — ABNORMAL HIGH (ref 0.00–5.00)

## 2023-03-23 MED ORDER — HEPARIN SOD (PORK) LOCK FLUSH 100 UNIT/ML IV SOLN
500.0000 [IU] | Freq: Once | INTRAVENOUS | Status: AC
  Administered 2023-03-23: 500 [IU] via INTRAVENOUS

## 2023-03-23 MED ORDER — SODIUM CHLORIDE 0.9% FLUSH
10.0000 mL | Freq: Once | INTRAVENOUS | Status: AC
  Administered 2023-03-23: 10 mL via INTRAVENOUS

## 2023-03-23 MED ORDER — SODIUM CHLORIDE 0.9 % IV SOLN: INTRAVENOUS | Status: DC

## 2023-03-23 NOTE — Patient Instructions (Signed)

## 2023-03-23 NOTE — Progress Notes (Signed)
Pt not receiving treatment today. Receiving IVF'S. Roxan Diesel, NP and Dr Thornton Papas attending patient.

## 2023-03-23 NOTE — Progress Notes (Signed)
[facility] OFFICE PROGRESS NOTE   Diagnosis: Gastroesophageal cancer  INTERVAL HISTORY:   [name] returns for follow-up.  He was most recently hospitalized with Klebsiella sepsis.  He was discharged home 03/07/2023.  He is feeling weak.  He vomited applesauce this morning.  Fluid intake reported as adequate.  Very poor solid intake.  He has a sacral wound which his friend attributes to him having diarrhea.  He complains of abdominal pain for the past 2 weeks.  Objective:  Vital signs in last 24 hours:  Blood pressure 95/81, pulse 89, temperature 97.6 F (36.4 C), temperature source Temporal, resp. rate 14, height 5\' 8"  (1.727 m), weight 110 lb (49.9 kg), SpO2 94%.    HEENT: No thrush. Resp: Distant breath sounds, no respiratory distress. Cardio: Regular rate and rhythm. GI: Abdomen soft, tender right upper abdomen.  No hepatosplenomegaly. Vascular: No right leg edema. Skin: Decreased skin turgor.  Superficial wound at the sacrum. Port-A-Cath without erythema.  Lab Results:  Lab Results  Component Value Date   WBC 8.3 03/23/2023   HGB 11.7 (L) 03/23/2023   HCT 37.2 (L) 03/23/2023   MCV 88.4 03/23/2023   PLT 140 (L) 03/23/2023   NEUTROABS 6.2 03/23/2023    Imaging:  No results found.  Medications: I have reviewed the patient's current medications.  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 -02/11/2022 CTA chest-7.8 x 8.5 cm subtle hypoattenuating mass lesion in segment IV of the liver. -02/11/2022 CT abdomen/pelvis-8.7 x 7.8 or ill-defined irregular mass in the medial segment left liver, 4.5 x 3.4 cm necrotic mass lesion just cranial to the esophagogastric junction with metastatic lymphadenopathy in the abdomen and pelvis, irregular nodular peritoneal thickening in the lower abdomen and  pelvis -02/11/2022 MRI abdomen-rim-enhancing likely necrotic mass or lymph node conglomerate centered around the gastroesophageal junction measuring 5.0 x 3.9 cm, large rim-enhancing likely necrotic liver mass centered in the anterior left lobe of the liver measuring 7.8 x 7.2 cm, enlarged gastrohepatic ligament and retroperitoneal lymph nodes -Labs from 02/11/2022-AFP 2.4, CA 19.9 88, CEA 287 -Ultrasound-guided liver biopsy performed 02/15/2022-adenocarcinoma with extensive necrosis, CK7 positive, negative for CK20, CDX2, TTF-1, and PAX8, consistent with a pancreatic, lung, or upper gastrointestinal primary -PET 03/08/2022-hypermetabolic distal esophagus mass, hypermetabolic centrally necrotic hepatic mass, hypermetabolic periportal nodes, exophytic GE junction mass without hypermetabolism-likely benign lesion, no hypermetabolism in the pancreas head, bilateral hypermetabolic adrenal gland lesions, small hypermetabolic left parotid gland lesion.  No loss of mismatch repair protein expression, HER2 3+ -CTs 03/11/2022-no change from PET 03/08/2022, known distal esophagus mass-not visualized on unenhanced CT, large hepatic metastasis and upper abdominal nodal metastases, chronic pancreatitis with 4.3 cm pseudocyst in the pancreas head -Cycle 1 FOLFOX, trastuzumab, Pembrolizumab 04/06/2022 -Cycle 2 oxaliplatin, 5-FU bolus/pump and trastuzumab held due to hospitalization following cycle 1 with possible coronary vasospasm -Pembrolizumab 04/25/2022 -CT abdomen/pelvis 05/02/2022-increased peripancreatic edema, increased size of pancreas head pseudocyst, increased necrotic nodal masses at the GE junction -Cycle 3 oxaliplatin, trastuzumab, pembrolizumab 05/15/2022; 5-FU held secondary to cardiac toxicity following cycle 1 -Cycle 4 oxaliplatin, trastuzumab 05/29/2022, 5-FU held secondary to cardiac toxicity -Cycle 5 oxaliplatin, trastuzumab, Pembrolizumab (6-week dosing for Pembrolizumab) 06/12/2022, 5-FU held secondary  to cardiac toxicity -Cycle 6 oxaliplatin, trastuzumab 06/28/2022, 5-FU held -Cycle 7 oxaliplatin, trastuzumab 07/17/2022, 5-FU held -CTs 07/19/2022-decrease in the dominant anterior liver mass, 2 new small liver lesions compared to 05/02/2022, new right lower lobe nodule,  enlargement of epicardial lymph nodes, slight decrease in size of celiac axis, gastropathic ligament, and portacaval lymphadenopathy, unchanged esophageal thickening -Cycle 8 oxaliplatin and pembrolizumab 08/14/2022, 5-FU and trastuzumab held secondary to potential cardiac toxicity -CTs 09/12/2022-increased number hepatic metastases, few are larger; multiple abnormal lymph nodes identified in the abdomen and lower mediastinum, relatively similar; increased stranding along the mesentery and retroperitoneum; persistent wall thickening of the duodenum with slight ectasia of the lumen; persistent left hepatic lobe biliary duct dilatation, likely compressive from adjacent metastatic lesions; previous 6 mm right lower lobe lung nodule obscured by motion. -Cycle 1 Taxol/cyramza 09/20/2022 -Cycle 2 Taxol/Cyramza 10/04/2022 -Cycle 3 Taxol/Cyramza 10/19/2022 (Taxol dose reduced secondary to thrombocytopenia) -Cycle 4 Taxol/cyramza 11/16/2022 -Cycle 5 Taxol/Cyramza 11/30/2022 -CT chest emergency department 12/17/2022 due to complaint of dyspnea-no enlarged mediastinal, hilar or axillary lymph nodes.  New 7 mm lung nodule seen within the superior segment right lower lobe.  Additional 9 mm posterior right lower lobe lung nodule, previously measured 5 mm on prior study.  Small bilateral pleural effusions.  Multiple liver masses of various sizes, present on the prior exam.  4.6 x 3.7 low-attenuation extending from the posterior aspect of the left lobe of the liver to the region of the gastric fundus, previously measured 4.8 x 3.9 cm -Cycle 6 Taxol/Cyramza 01/08/2023 -CT chest 02/28/2023-slightly less conspicuous right upper lobe peripheral groundglass opacities,  likely increase in size of multiple poorly defined hepatic masses, increase in size of a gastroesophageal/gastric fundus mass -CT abdomen/pelvis 03/05/2023, compared to 09/12/2022-enlargement of metastatic liver lesions, "new "pancreas tail mass with invasion of the splenic vein (present on 12/17/2022 chest CT by my review), new small to moderate volume ascites, stable upper abdominal adenopathy -Cancer type ID (molecular diagnosis) gastroesophageal adenocarcinoma 90% probability   2.  Myocardial infarction/cardiac arrest 01/31/2022 -Echocardiogram 02/01/2022-LVEF 60-65%, right ventricle severely dilated with hypokinesis in the basal and midportion, hypokinesis in the apical portion, right ventricular systolic function moderately reduced -Echocardiogram on 05/01/2022, LVEF 50-55%, decreased right ventricular systolic function, moderately enlarged right ventricle -Echocardiogram 08/05/2022-LVEF 45-50%, mildly decreased left ventricular function, moderate reduced right ventricular function 3.  Microcytic anemia -Labs from 02/11/2022-ferritin 151, iron 13, percent saturation 5% 4.  DVT/pulmonary embolism-bilateral lower extremity DVTs 01/10/2022, pulmonary emboli on chest CT 01/31/2022-apixaban 5.  PAD status post AKA in 2021 6.  RA 7.  Bilateral pleural effusions -Ultrasound-guided thoracentesis performed on 02/11/2022.  Cytology negative for malignancy. 8.  COPD 9.  Admission 03/11/2022 with E. coli bacteremia Ultrasound-guided biopsy/aspiration of dominant necrotic appearing liver lesion 03/17/2022-scant fluid aspirated-negative culture, no organisms or white cells.  Pathology with necrotic tissue. 10.  Admission 04/29/2022 after a fall, multiple skull fractures 11.  Fever, hypotension-Klebsiella bacteremia on blood culture 04/30/2022 12.  Admission 04/09/2022 with chest pain, NSTEMI 13.  Admission 08/04/2022 with ventricular tachycardia, status post cardioversion, noted to have hypokalemia and hypomagnesemia,  started on amiodarone 14.  Rash following cycle 3 Taxol/ramucirumab-admitted to [facility] due to concern for SJS.  SJS and disseminated zoster ruled out. 15.  Admission 12/27/2022 with sepsis syndrome, probable aspiration pneumonia 16.  Admission 02/28/2023 with sepsis syndrome, blood culture positive for Klebsiella  Disposition: [name] [name] has metastatic gastroesophageal cancer.  He has a poor performance status.  He has lost a significant amount of weight.  He does not appear to be a candidate for additional treatment in his current condition.  We discussed a hospice referral.  [name] [name] is in agreement.  The hospice team is scheduled to come to his home  this afternoon.  End-of-life issues/CODE STATUS reviewed.  He will be placed on No Code Blue status.  He will return for a follow-up visit in 3 to 4 weeks.  Patient seen with Dr. Truett Perna.    Lonna Cobb ANP/GNP-BC   03/23/2023  8:31 AM This was a shared visit with Lonna Cobb.  [name] [name] was interviewed and examined.  His caregivers were present for today's visit.  [name] [name] has experienced a decline in his performance status.  The CEA is higher.  He does not appear to be a candidate for further chemotherapy.  I recommend hospice care.  He is in agreement.  We discussed CPR and ACLS.  He will be placed on a no CODE BLUE status.  He has a superficial sacral decubitus ulcer.  He can continue skin care with the hospice team.  [name] [name] would like to continue follow-up at the Cancer center.  I was present for greater than 50% of today's visit.  I performed medical decision making.  Mancel Bale, MD

## 2023-03-23 NOTE — Progress Notes (Signed)
Patient seen by Lonna Cobb NP today  Vitals are within treatment parameters:Yes   Labs are within treatment parameters: Yes   Treatment plan has been signed: Yes   Per physician team, Patient will not be receiving treatment today.

## 2023-03-25 NOTE — Progress Notes (Deleted)
Cardiology Clinic Note   Patient Name: [name] [name] Date of Encounter: 03/25/2023  Primary Care Provider:  Rocky Morel, DO Primary Cardiologist:  Rollene Rotunda, MD  Patient Profile     62 y.o. male with a hx of CAD (occluded small nondominant RCA ), VF arrest in the setting of STEMI 01/2022, sustained VT in the setting of hypokalemia/hypomagnesemia/prolonged QT 07/2022, cardiomyopathy, metastatic adenocarcinoma of the esophagus s/p chemotherapy, HTN,  DVT/PE 2023 on Eliquis, left AKA, anemia, COPD.    Recent hospitalization on 02/28/2019 for in the setting of altered mental status and was seen by cardiology in the setting of acute on chronic right heart failure and cardiomyopathy.  He was found to be bradycardic and amiodarone was discontinued.  He was continued on medical therapy due to overall poor prognosis concerning esophageal cancer and metastasis to the liver.  Past Medical History    Past Medical History:  Diagnosis Date   Acute respiratory failure (HCC) 01/31/2022   Bilateral shoulder pain 08/20/2012   Bite wound of left hand 09/27/2021   CAD (coronary artery disease)    Occluded nondominent RCA managed medically - August 2023   Cardiac arrest [facility])    OOH VF - August 2023   Community acquired pneumonia 11/07/2011   Critical limb ischemia with history of revascularization of same extremity (HCC) 04/28/2020   DDD (degenerative disc disease), lumbosacral     and grade 2 slip   DVT (deep venous thrombosis) (HCC) 01/31/2022   Esophageal cancer (HCC)    Adenocarcinoma with metastasis   GERD (gastroesophageal reflux disease)    History of anemia    History of COVID-19 05/10/2019   History of critical lower limb ischemia 02/19/2019   Hypertension    Impingement syndrome of shoulder region    Ischemic ulcer of toe of left foot (HCC)    Liver mass    Low back pain without sciatica    Lupus (HCC)    Multiple fractures of ribs, bilateral, due to CPR trauma    Onychomycosis     Paronychia of great toe    Peripheral vascular disease (HCC)    Pressure injury of skin    Pulmonary embolus (HCC)    RA (rheumatoid arthritis) (HCC)    Rotator cuff tear 11/04/2014   Sternal fracture    Stroke [facility])    Past Surgical History:  Procedure Laterality Date   ABDOMINAL AORTAGRAM  02/20/2019   ABDOMINAL AORTOGRAM W/LOWER EXTREMITY N/A 02/20/2019   Procedure: ABDOMINAL AORTOGRAM W/LOWER EXTREMITY;  Surgeon: Cephus Shelling, MD;  [facility];  Service: Cardiovascular;  Laterality: N/A;   ACHILLES TENDON SURGERY Bilateral    AMPUTATION Left 05/11/2020   Procedure: LEFT ABOVE KNEE AMPUTATION;  Surgeon: Chuck Hint, MD;  Location: [facility] OR;  Service: Vascular;  Laterality: Left;   ENDARTERECTOMY POPLITEAL Left 05/01/2020   Procedure: ENDARTERECTOMY POPLITEAL;  Surgeon: Maeola Harman, MD;  Location: [facility] OR;  Service: Vascular;  Laterality: Left;   FASCIOTOMY Left 05/01/2020   Procedure: FASCIOTOMY;  Surgeon: Maeola Harman, MD;  Location: [facility] OR;  Service: Vascular;  Laterality: Left;   FEMORAL-POPLITEAL BYPASS GRAFT Left 02/26/2019   Procedure: BYPASS GRAFT FEMORAL-POPLITEAL ARTERY LEFT LEG USING 6mm PROPATEN GRAFT;  Surgeon: Nada Libman, MD;  Location: [facility] OR;  Service: Vascular;  Laterality: Left;   INTRAOPERATIVE ARTERIOGRAM Left 01/22/2020   Procedure: INTRA OPERATIVE ARTERIOGRAM with administration of thrombolyics in Left femoral to popliteal bypass.;  Surgeon: Cephus Shelling, MD;  Location: [facility] OR;  Service: Vascular;  Laterality: Left;   IR IMAGING GUIDED PORT INSERTION  03/07/2022   LEFT HEART CATH AND CORONARY ANGIOGRAPHY N/A 03/15/2017   Procedure: LEFT HEART CATH AND CORONARY ANGIOGRAPHY;  Surgeon: Yvonne Kendall, MD;  [facility]: [facility];  Service: Cardiovascular;  Laterality: N/A;   LEFT HEART CATH AND CORONARY ANGIOGRAPHY N/A 02/06/2022   Procedure: LEFT HEART CATH AND CORONARY ANGIOGRAPHY;  Surgeon:  Sw[place], Peter M, MD;  Location: [facility] INVASIVE CV LAB;  Service: Cardiovascular;  Laterality: N/A;   LOWER EXTREMITY INTERVENTION Left 04/29/2020   Procedure: LOWER EXTREMITY INTERVENTION- LYSIS;  Surgeon: Cephus Shelling, MD;  [facility];  Service: Cardiovascular;  Laterality: Left;   OSTEOTOMY AND ULNAR SHORTENING Right 07/22/2002   PATCH ANGIOPLASTY Left 05/01/2020   Procedure: PATCH ANGIOPLASTY USING HEMASHIELD PLATINUM FINESSE PATCH;  Surgeon: Maeola Harman, MD;  Location: [facility] OR;  Service: Vascular;  Laterality: Left;   PERIPHERAL VASCULAR ATHERECTOMY  01/23/2020   Procedure: PERIPHERAL VASCULAR ATHERECTOMY;  Surgeon: Cephus Shelling, MD;  [facility];  Service: Cardiovascular;;   PERIPHERAL VASCULAR BALLOON ANGIOPLASTY  01/23/2020   Procedure: PERIPHERAL VASCULAR BALLOON ANGIOPLASTY;  Surgeon: Cephus Shelling, MD;  [facility];  Service: Cardiovascular;;   PERIPHERAL VASCULAR INTERVENTION Left 04/30/2020   Procedure: PERIPHERAL VASCULAR INTERVENTION;  Surgeon: Leonie Douglas, MD;  [facility];  Service: Cardiovascular;  Laterality: Left;   PERIPHERAL VASCULAR THROMBECTOMY N/A 01/23/2020   Procedure: lysis recheck;  Surgeon: Cephus Shelling, MD;  [facility];  Service: Cardiovascular;  Laterality: N/A;  + [place]    PERIPHERAL VASCULAR THROMBECTOMY N/A 04/30/2020   Procedure: Lysis Recheck;  Surgeon: Leonie Douglas, MD;  [facility];  Service: Cardiovascular;  Laterality: N/A;   SHOULDER ARTHROSCOPY WITH BICEPSTENOTOMY Right 03/11/2015   Procedure: SHOULDER ARTHROSCOPY WITH BICEPSTENOTOMY;  Surgeon: Loreta Ave, MD;  Location: [facility];  Service: Orthopedics;  Laterality: Right;   SHOULDER ARTHROSCOPY WITH DISTAL CLAVICLE RESECTION Left 11/19/2014   Procedure: SHOULDER ARTHROSCOPY WITH DISTAL CLAVICLE RESECTION;  Surgeon: Mckinley Jewel, MD;   Location: [facility];  Service: Orthopedics;  Laterality: Left;   SHOULDER ARTHROSCOPY WITH ROTATOR CUFF REPAIR AND SUBACROMIAL DECOMPRESSION Left 11/19/2014   Procedure: LEFT SHOULDER ARTHROSCOPY, DEBRIDEMENT DISTAL CLAVICLE EXCISION, ACROMIOPLASTY WITH ROTATOR CUFF REPAIR ;  Surgeon: Mckinley Jewel, MD;  Location: [facility];  Service: Orthopedics;  Laterality: Left;   THROMBECTOMY OF BYPASS GRAFT FEMORAL- POPLITEAL ARTERY Left 05/01/2020   Procedure: THROMBECTOMY OF LOWER EXTREMITY;  Surgeon: Maeola Harman, MD;  Location: [facility] OR;  Service: Vascular;  Laterality: Left;   TRANSFORAMINAL LUMBAR INTERBODY FUSION (TLIF) WITH PEDICLE SCREW FIXATION 1 LEVEL N/A 01/03/2018   Procedure: TRANSFORAMINAL LUMBAR INTERBODY FUSION (TLIF) LUMBAR FIVE-SACRAL ONE;  Surgeon: Venita Lick, MD;  [facility];  Service: Orthopedics;  Laterality: N/A;   ULTRASOUND GUIDANCE FOR VASCULAR ACCESS Right 01/22/2020   Procedure: ULTRASOUND GUIDANCE FOR VASCULAR ACCESS Right Common Femoral Artery.;  Surgeon: Cephus Shelling, MD;  [facility];  Service: Vascular;  Laterality: Right;   VIDEO ASSISTED THORACOSCOPY (VATS)/DECORTICATION Right 11/21/2011   drainage of empyema    Allergies  Allergies  Allergen Reactions   Dilaudid [Hydromorphone Hcl] Rash   Ramipril Rash and Other (See Comments)    Per patient, rash was on the right arm and leg only; no angioedema   Tramadol  Itching and Rash    History of Present Illness    Mr. Diemer is here for posthospitalization follow-up after being admitted for acute on chronic right heart failure, with history of medically managed CAD, and hyperlipidemia.  He was also found to have hypotension and bradycardia.  Medical management only for coronary artery disease.  Amlodipine and isosorbide mononitrate were discontinued due to hypotension.  Home Medications    Current Outpatient Medications  Medication Sig Dispense Refill   albuterol  (VENTOLIN HFA) 108 (90 Base) MCG/ACT inhaler INHALE 2 PUFFS INTO THE LUNGS EVERY 6 (SIX) HOURS AS NEEDED FOR WHEEZING OR SHORTNESS OF BREATH. 8.5 g 1   amiodarone (PACERONE) 200 MG tablet Take 1 tablet (200 mg total) by mouth 2 (two) times daily. 60 tablet 11   apixaban (ELIQUIS) 5 MG TABS tablet TAKE 1 TABLET (5 MG TOTAL) BY MOUTH 2 (TWO) TIMES DAILY. 60 tablet 2   atorvastatin (LIPITOR) 40 MG tablet Take 1 tablet (40 mg total) by mouth daily at 6 PM. 30 tablet 0   clopidogrel (PLAVIX) 75 MG tablet Take 75 mg by mouth daily.     diclofenac Sodium (VOLTAREN) 1 % GEL Apply 2 g topically 4 (four) times daily. 200 g 0   DULoxetine (CYMBALTA) 30 MG capsule Take 30 mg by mouth daily as needed (sleep).     feeding supplement (ENSURE ENLIVE / ENSURE PLUS) LIQD Take 237 mLs by mouth 3 (three) times daily between meals. 237 mL 12   FEROSUL 325 (65 Fe) MG tablet TAKE 1 TABLET (325 MG TOTAL) BY MOUTH DAILY WITH BREAKFAST. 100 tablet 0   fluticasone-salmeterol (ADVAIR HFA) 45-21 MCG/ACT inhaler Inhale 2 puffs into the lungs 2 (two) times daily. 1 each 12   folic acid (FOLVITE) 1 MG tablet Take 1 mg by mouth daily.     gabapentin (NEURONTIN) 300 MG capsule TAKE 1 CAPSULE (300 MG TOTAL) BY MOUTH 2 (TWO) TIMES DAILY. 60 capsule 3   hydrOXYzine (ATARAX) 25 MG tablet Take 25 mg by mouth at bedtime.     lidocaine-prilocaine (EMLA) cream Apply 1 Application topically as directed. Apply to port site 2 hours prior to stick and cover with Press-and-Seal to numb port before access 30 g 1   loperamide (IMODIUM A-D) 2 MG tablet Take 2 mg by mouth 4 (four) times daily as needed for diarrhea or loose stools.     meloxicam (MOBIC) 7.5 MG tablet Take 1 tablet (7.5 mg total) by mouth daily as needed for pain. 30 tablet 3   NYAMYC powder Apply 1 Application topically 2 (two) times daily.     ondansetron (ZOFRAN) 8 MG tablet START TAKING 72 HOURS AFTER CHEMOTHERAPY TREATMENT. (Patient taking differently: Take 8 mg by mouth every 8  (eight) hours as needed for vomiting or nausea.) 30 tablet 1   Oxycodone HCl 10 MG TABS Take 10 mg by mouth every 6 (six) hours.     polyethylene glycol (MIRALAX / GLYCOLAX) 17 g packet Take 17 g by mouth daily as needed for moderate constipation. 14 each 0   potassium chloride (KLOR-CON) 10 MEQ tablet TAKE 2 TABLETS(20 MEQ) BY MOUTH DAILY 180 tablet 0   prochlorperazine (COMPAZINE) 10 MG tablet TAKE 1 TABLET (10 MG TOTAL) BY MOUTH EVERY 6 (SIX) HOURS AS NEEDED. 60 tablet 1   No current facility-administered medications for this visit.     Family History    Family History  Problem Relation Age of Onset   Heart attack Father 63  Alcohol abuse Father    Aneurysm Mother    Stroke Neg Hx    Cancer Neg Hx    He indicated that his mother is deceased. He indicated that his father is deceased. He indicated that his sister is alive. He indicated that the status of his neg hx is unknown.  Social History    Social History   Socioeconomic History   Marital status: Single    Spouse name: Not on file   Number of children: 0   Years of education: Not on file   Highest education level: Not on file  Occupational History   Occupation: Disabled    Comment: 2/2 RA  Tobacco Use   Smoking status: Former    Current packs/day: 0.00    Average packs/day: 0.3 packs/day for 20.0 years (5.0 ttl pk-yrs)    Types: Cigarettes    Start date: 02/19/1999    Quit date: 02/19/2019    Years since quitting: 4.0   Smokeless tobacco: Never   Tobacco comments:    Patient stated he smokes a couple day and is attempting to quit smoking  Vaping Use   Vaping status: Never Used  Substance and Sexual Activity   Alcohol use: No    Alcohol/week: 0.0 standard drinks of alcohol    Comment: sober 1998   Drug use: No   Sexual activity: Not on file  Other Topics Concern   Not on file  Social History Narrative   On disability since 1992, used to work with city of [place]. Quit drinking in 1990.      Current  Social History 11/05/2019        Patient lives by himself most of the time. Sometimes girlfriend's son stays with him in a one level home. There are 3 steps with handrail up to the entrance the patient uses.       Patient's method of transportation is personal truck. This was recently vandalized and patient doesn't have funds to repair at this time.      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      Identified important Relationships are "My girlfriend, Selena Batten."       Pets : American Terrier Archivist), Lab (Roxy)       Interests / Fun: Walk, watch TV       Current Stressors: "Going through a lot the last 6-7 years; I worry too much about my health." (Discussed IBH, patient not interested at this time.)      Religious / Personal Beliefs: Baptist       L. Ducatte, BSN, RN-BC    Social Determinants of Health   Financial Resource Strain: Low Risk  (11/10/2022)   Overall Financial Resource Strain (CARDIA)    Difficulty of Paying Living Expenses: Not hard at all  Food Insecurity: No Food Insecurity (03/09/2023)   Hunger Vital Sign    Worried About Running Out of Food in the Last Year: Never true    Ran Out of Food in the Last Year: Never true  Transportation Needs: Unmet Transportation Needs (11/10/2022)   PRAPARE - Administrator, Civil Service (Medical): Yes    Lack of Transportation (Non-Medical): Yes  Physical Activity: Insufficiently Active (11/10/2022)   Exercise Vital Sign    Days of Exercise per Week: 2 days    Minutes of Exercise per Session: 10 min  Stress: No Stress Concern Present (11/10/2022)   [place]  [employer] of Occupational Health - Occupational Stress Questionnaire    Feeling of Stress : Not at all  Social Connections: Moderately Isolated (11/10/2022)   Social Connection and Isolation Panel [NHANES]    Frequency of Communication with Friends and Family: Twice a week    Frequency of Social Gatherings with  Friends and Family: More than three times a week    Attends Religious Services: Never    Database administrator or Organizations: No    Attends Banker Meetings: Never    Marital Status: Living with partner  Intimate Partner Violence: Not At Risk (11/10/2022)   Humiliation, Afraid, Rape, and Kick questionnaire    Fear of Current or Ex-Partner: No    Emotionally Abused: No    Physically Abused: No    Sexually Abused: No     Review of Systems    General:  No chills, fever, night sweats or weight changes.  Cardiovascular:  No chest pain, dyspnea on exertion, edema, orthopnea, palpitations, paroxysmal nocturnal dyspnea. Dermatological: No rash, lesions/masses Respiratory: No cough, dyspnea Urologic: No hematuria, dysuria Abdominal:   No nausea, vomiting, diarrhea, bright red blood per rectum, melena, or hematemesis Neurologic:  No visual changes, wkns, changes in mental status. All other systems reviewed and are otherwise negative except as noted above.       Physical Exam    VS:  There were no vitals taken for this visit. , BMI There is no height or weight on file to calculate BMI.     GEN: Well nourished, well developed, in no acute distress. HEENT: normal. Neck: Supple, no JVD, carotid bruits, or masses. Cardiac: RRR, no murmurs, rubs, or gallops. No clubbing, cyanosis, edema.  Radials/DP/PT 2+ and equal bilaterally.  Respiratory:  Respirations regular and unlabored, clear to auscultation bilaterally. GI: Soft, nontender, nondistended, BS + x 4. MS: no deformity or atrophy. Skin: warm and dry, no rash. Neuro:  Strength and sensation are intact. Psych: Normal affect.      Lab Results  Component Value Date   WBC 8.3 03/23/2023   HGB 11.7 (L) 03/23/2023   HCT 37.2 (L) 03/23/2023   MCV 88.4 03/23/2023   PLT 140 (L) 03/23/2023   Lab Results  Component Value Date   CREATININE 0.98 03/23/2023   BUN 20 03/23/2023   NA 137 03/23/2023   K 4.2 03/23/2023   CL  105 03/23/2023   CO2 23 03/23/2023   Lab Results  Component Value Date   ALT 27 03/23/2023   AST 63 (H) 03/23/2023   GGT 187 (H) 02/28/2023   ALKPHOS 507 (H) 03/23/2023   BILITOT 0.7 03/23/2023   Lab Results  Component Value Date   CHOL 94 02/28/2022   HDL 26 (L) 02/28/2022   LDLCALC 31 02/28/2022   TRIG 186 (H) 02/28/2022   CHOLHDL 3.6 02/28/2022    Lab Results  Component Value Date   HGBA1C 5.0 02/28/2023     Review of Prior Studies Echocardiogram 03/01/2023 1. Left ventricular ejection fraction, by estimation, is 55 to 60%. The  left ventricle has normal function. The left ventricle has no regional  wall motion abnormalities. Left ventricular diastolic parameters are  indeterminate.   2. Right ventricular systolic function is severely reduced. The right  ventricular size is severely enlarged.   3. Right atrial size was severely dilated.   4. The mitral valve is normal in structure. Trivial mitral valve  regurgitation. No evidence of mitral stenosis.   5. Tricuspid valve regurgitation is  moderate.   6. The aortic valve is normal in structure. Aortic valve regurgitation is  trivial. No aortic stenosis is present.   7. The inferior vena cava is dilated in size with <50% respiratory  variability, suggesting right atrial pressure of 15 mmHg.      LHC 01/24/2022 Dist Cx lesion is 30% stenosed.   Prox RCA lesion is 100% stenosed.   LV end diastolic pressure is normal.   Single vessel occlusive CAD. Occlusion of proximal nondominant RCA which is new since 2018.  Normal LVEDP   Plan: medical therapy  Assessment & Plan   1.  ***     {Are you ordering a CV Procedure (e.g. stress test, cath, DCCV, TEE, etc)?   Press F2        :[id number]}   Signed, Bettey Mare. Liborio Nixon, ANP, AACC   03/25/2023 4:29 PM      Office [phone] Fax [phone]  Notice: This dictation was prepared with Dragon dictation along with smaller phrase technology. Any transcriptional  errors that result from this process are unintentional and may not be corrected upon review.

## 2023-03-27 ENCOUNTER — Other Ambulatory Visit: Payer: Self-pay

## 2023-03-27 ENCOUNTER — Ambulatory Visit: Payer: 59 | Admitting: Adult Health

## 2023-03-30 ENCOUNTER — Other Ambulatory Visit: Payer: Self-pay | Admitting: Oncology

## 2023-04-02 ENCOUNTER — Encounter: Payer: Self-pay | Admitting: Oncology

## 2023-04-05 ENCOUNTER — Encounter (HOSPITAL_BASED_OUTPATIENT_CLINIC_OR_DEPARTMENT_OTHER): Payer: 59 | Admitting: Internal Medicine

## 2023-04-10 NOTE — Telephone Encounter (Signed)
TC

## 2023-04-11 DIAGNOSIS — C787 Secondary malignant neoplasm of liver and intrahepatic bile duct: Secondary | ICD-10-CM | POA: Diagnosis not present

## 2023-04-11 DIAGNOSIS — C159 Malignant neoplasm of esophagus, unspecified: Secondary | ICD-10-CM | POA: Diagnosis not present

## 2023-04-23 ENCOUNTER — Encounter: Payer: Self-pay | Admitting: *Deleted

## 2023-04-23 NOTE — Progress Notes (Signed)
Received message from caregiver that [name] [name] passed away on May 07, 2023.

## 2023-04-24 ENCOUNTER — Inpatient Hospital Stay: Payer: 59 | Admitting: Oncology

## 2023-04-27 DEATH — deceased
